# Patient Record
Sex: Female | Born: 1935
Health system: Southern US, Community
[De-identification: ages and names within clinical notes are randomized; demographics above are authoritative.]

## PROBLEM LIST (undated history)

## (undated) DIAGNOSIS — N189 Chronic kidney disease, unspecified: Secondary | ICD-10-CM

## (undated) DIAGNOSIS — N3281 Overactive bladder: Secondary | ICD-10-CM

## (undated) DIAGNOSIS — M545 Low back pain, unspecified: Secondary | ICD-10-CM

## (undated) DIAGNOSIS — E079 Disorder of thyroid, unspecified: Secondary | ICD-10-CM

## (undated) DIAGNOSIS — R188 Other ascites: Secondary | ICD-10-CM

## (undated) DIAGNOSIS — I10 Essential (primary) hypertension: Secondary | ICD-10-CM

## (undated) DIAGNOSIS — F0390 Unspecified dementia without behavioral disturbance: Secondary | ICD-10-CM

## (undated) DIAGNOSIS — E039 Hypothyroidism, unspecified: Secondary | ICD-10-CM

## (undated) DIAGNOSIS — M810 Age-related osteoporosis without current pathological fracture: Secondary | ICD-10-CM

## (undated) DIAGNOSIS — E119 Type 2 diabetes mellitus without complications: Secondary | ICD-10-CM

## (undated) DIAGNOSIS — E785 Hyperlipidemia, unspecified: Secondary | ICD-10-CM

## (undated) DIAGNOSIS — E7800 Pure hypercholesterolemia, unspecified: Secondary | ICD-10-CM

## (undated) DIAGNOSIS — M1990 Unspecified osteoarthritis, unspecified site: Secondary | ICD-10-CM

## (undated) DIAGNOSIS — K219 Gastro-esophageal reflux disease without esophagitis: Secondary | ICD-10-CM

## (undated) DIAGNOSIS — G8929 Other chronic pain: Secondary | ICD-10-CM

## (undated) DIAGNOSIS — L309 Dermatitis, unspecified: Secondary | ICD-10-CM

## (undated) DIAGNOSIS — L509 Urticaria, unspecified: Secondary | ICD-10-CM

## (undated) HISTORY — PX: OTHER SURGICAL HISTORY: SHX169

## (undated) HISTORY — PX: ABDOMINAL HYSTERECTOMY: SHX81

## (undated) HISTORY — DX: Overactive bladder: N32.81

## (undated) HISTORY — DX: Urticaria, unspecified: L50.9

## (undated) HISTORY — DX: Gastro-esophageal reflux disease without esophagitis: K21.9

## (undated) HISTORY — DX: Type 2 diabetes mellitus without complications: E11.9

## (undated) HISTORY — DX: Dermatitis, unspecified: L30.9

## (undated) HISTORY — DX: Age-related osteoporosis without current pathological fracture: M81.0

## (undated) HISTORY — PX: KNEE ARTHROSCOPY: SUR90

---

## 1998-04-08 ENCOUNTER — Encounter: Admission: RE | Admit: 1998-04-08 | Discharge: 1998-07-07 | Payer: Self-pay | Admitting: *Deleted

## 1998-06-18 ENCOUNTER — Ambulatory Visit (HOSPITAL_COMMUNITY): Admission: RE | Admit: 1998-06-18 | Discharge: 1998-06-18 | Payer: Self-pay | Admitting: *Deleted

## 1998-07-17 ENCOUNTER — Other Ambulatory Visit: Admission: RE | Admit: 1998-07-17 | Discharge: 1998-07-17 | Payer: Self-pay | Admitting: Internal Medicine

## 1998-07-29 ENCOUNTER — Ambulatory Visit (HOSPITAL_COMMUNITY): Admission: RE | Admit: 1998-07-29 | Discharge: 1998-07-29 | Payer: Self-pay | Admitting: *Deleted

## 1999-03-15 ENCOUNTER — Emergency Department (HOSPITAL_COMMUNITY): Admission: EM | Admit: 1999-03-15 | Discharge: 1999-03-15 | Payer: Self-pay | Admitting: Emergency Medicine

## 2000-02-27 ENCOUNTER — Encounter: Admission: RE | Admit: 2000-02-27 | Discharge: 2000-02-27 | Payer: Self-pay | Admitting: Internal Medicine

## 2000-02-27 ENCOUNTER — Encounter: Payer: Self-pay | Admitting: Internal Medicine

## 2000-04-27 ENCOUNTER — Emergency Department (HOSPITAL_COMMUNITY): Admission: EM | Admit: 2000-04-27 | Discharge: 2000-04-28 | Payer: Self-pay | Admitting: Emergency Medicine

## 2001-02-28 ENCOUNTER — Encounter: Admission: RE | Admit: 2001-02-28 | Discharge: 2001-02-28 | Payer: Self-pay | Admitting: Internal Medicine

## 2001-02-28 ENCOUNTER — Encounter: Payer: Self-pay | Admitting: Internal Medicine

## 2001-03-08 ENCOUNTER — Encounter: Payer: Self-pay | Admitting: Emergency Medicine

## 2001-03-08 ENCOUNTER — Emergency Department (HOSPITAL_COMMUNITY): Admission: EM | Admit: 2001-03-08 | Discharge: 2001-03-08 | Payer: Self-pay | Admitting: Emergency Medicine

## 2001-12-12 ENCOUNTER — Encounter: Payer: Self-pay | Admitting: Emergency Medicine

## 2001-12-12 ENCOUNTER — Emergency Department (HOSPITAL_COMMUNITY): Admission: EM | Admit: 2001-12-12 | Discharge: 2001-12-12 | Payer: Self-pay | Admitting: Emergency Medicine

## 2001-12-14 ENCOUNTER — Emergency Department (HOSPITAL_COMMUNITY): Admission: EM | Admit: 2001-12-14 | Discharge: 2001-12-14 | Payer: Self-pay | Admitting: Emergency Medicine

## 2002-02-16 ENCOUNTER — Emergency Department (HOSPITAL_COMMUNITY): Admission: EM | Admit: 2002-02-16 | Discharge: 2002-02-16 | Payer: Self-pay | Admitting: Emergency Medicine

## 2002-07-25 ENCOUNTER — Encounter: Payer: Self-pay | Admitting: *Deleted

## 2002-07-25 ENCOUNTER — Encounter: Admission: RE | Admit: 2002-07-25 | Discharge: 2002-07-25 | Payer: Self-pay | Admitting: *Deleted

## 2002-08-02 ENCOUNTER — Encounter: Admission: RE | Admit: 2002-08-02 | Discharge: 2002-08-02 | Payer: Self-pay | Admitting: *Deleted

## 2002-08-02 ENCOUNTER — Encounter: Payer: Self-pay | Admitting: *Deleted

## 2003-01-11 ENCOUNTER — Encounter: Admission: RE | Admit: 2003-01-11 | Discharge: 2003-01-11 | Payer: Self-pay | Admitting: Internal Medicine

## 2003-01-11 ENCOUNTER — Encounter: Payer: Self-pay | Admitting: Internal Medicine

## 2003-02-18 ENCOUNTER — Emergency Department (HOSPITAL_COMMUNITY): Admission: EM | Admit: 2003-02-18 | Discharge: 2003-02-18 | Payer: Self-pay | Admitting: Emergency Medicine

## 2003-04-13 ENCOUNTER — Ambulatory Visit (HOSPITAL_COMMUNITY): Admission: RE | Admit: 2003-04-13 | Discharge: 2003-04-13 | Payer: Self-pay | Admitting: *Deleted

## 2003-04-13 ENCOUNTER — Encounter (INDEPENDENT_AMBULATORY_CARE_PROVIDER_SITE_OTHER): Payer: Self-pay | Admitting: Specialist

## 2003-05-04 ENCOUNTER — Other Ambulatory Visit: Admission: RE | Admit: 2003-05-04 | Discharge: 2003-05-04 | Payer: Self-pay | Admitting: Internal Medicine

## 2003-05-08 ENCOUNTER — Encounter: Payer: Self-pay | Admitting: Internal Medicine

## 2003-05-08 ENCOUNTER — Encounter: Admission: RE | Admit: 2003-05-08 | Discharge: 2003-05-08 | Payer: Self-pay | Admitting: Internal Medicine

## 2003-11-23 ENCOUNTER — Encounter: Admission: RE | Admit: 2003-11-23 | Discharge: 2003-11-23 | Payer: Self-pay | Admitting: Internal Medicine

## 2004-02-13 ENCOUNTER — Emergency Department (HOSPITAL_COMMUNITY): Admission: EM | Admit: 2004-02-13 | Discharge: 2004-02-13 | Payer: Self-pay | Admitting: Emergency Medicine

## 2004-03-07 ENCOUNTER — Emergency Department (HOSPITAL_COMMUNITY): Admission: EM | Admit: 2004-03-07 | Discharge: 2004-03-07 | Payer: Self-pay

## 2004-06-16 ENCOUNTER — Encounter: Admission: RE | Admit: 2004-06-16 | Discharge: 2004-06-16 | Payer: Self-pay | Admitting: Internal Medicine

## 2004-09-12 ENCOUNTER — Emergency Department (HOSPITAL_COMMUNITY): Admission: EM | Admit: 2004-09-12 | Discharge: 2004-09-12 | Payer: Self-pay | Admitting: Emergency Medicine

## 2004-10-03 ENCOUNTER — Encounter: Admission: RE | Admit: 2004-10-03 | Discharge: 2004-10-03 | Payer: Self-pay | Admitting: Internal Medicine

## 2004-11-12 ENCOUNTER — Ambulatory Visit: Payer: Self-pay | Admitting: Family Medicine

## 2004-11-21 ENCOUNTER — Ambulatory Visit: Payer: Self-pay | Admitting: Family Medicine

## 2004-12-17 ENCOUNTER — Ambulatory Visit: Payer: Self-pay | Admitting: Family Medicine

## 2004-12-22 ENCOUNTER — Ambulatory Visit: Payer: Self-pay | Admitting: Family Medicine

## 2005-01-12 ENCOUNTER — Ambulatory Visit: Payer: Self-pay | Admitting: Endocrinology

## 2005-01-22 ENCOUNTER — Ambulatory Visit: Payer: Self-pay | Admitting: Endocrinology

## 2005-01-30 ENCOUNTER — Ambulatory Visit: Payer: Self-pay | Admitting: Endocrinology

## 2005-02-13 ENCOUNTER — Ambulatory Visit: Payer: Self-pay | Admitting: Endocrinology

## 2005-03-11 ENCOUNTER — Ambulatory Visit: Payer: Self-pay | Admitting: Endocrinology

## 2005-04-13 ENCOUNTER — Encounter: Admission: RE | Admit: 2005-04-13 | Discharge: 2005-04-13 | Payer: Self-pay | Admitting: Family Medicine

## 2005-05-07 ENCOUNTER — Ambulatory Visit: Payer: Self-pay | Admitting: Endocrinology

## 2005-06-05 ENCOUNTER — Ambulatory Visit: Payer: Self-pay | Admitting: Family Medicine

## 2005-06-29 ENCOUNTER — Ambulatory Visit: Payer: Self-pay | Admitting: Internal Medicine

## 2005-07-09 ENCOUNTER — Ambulatory Visit: Payer: Self-pay | Admitting: Family Medicine

## 2005-08-04 ENCOUNTER — Ambulatory Visit: Payer: Self-pay | Admitting: Endocrinology

## 2005-08-12 ENCOUNTER — Encounter (INDEPENDENT_AMBULATORY_CARE_PROVIDER_SITE_OTHER): Payer: Self-pay | Admitting: Specialist

## 2005-08-12 ENCOUNTER — Ambulatory Visit (HOSPITAL_COMMUNITY): Admission: RE | Admit: 2005-08-12 | Discharge: 2005-08-12 | Payer: Self-pay | Admitting: *Deleted

## 2005-09-04 ENCOUNTER — Ambulatory Visit: Payer: Self-pay | Admitting: Family Medicine

## 2005-09-16 ENCOUNTER — Ambulatory Visit: Payer: Self-pay | Admitting: Endocrinology

## 2005-10-09 ENCOUNTER — Ambulatory Visit: Payer: Self-pay | Admitting: Endocrinology

## 2005-10-27 ENCOUNTER — Ambulatory Visit: Payer: Self-pay | Admitting: Endocrinology

## 2005-11-04 ENCOUNTER — Ambulatory Visit: Payer: Self-pay | Admitting: Family Medicine

## 2005-12-04 ENCOUNTER — Encounter: Admission: RE | Admit: 2005-12-04 | Discharge: 2005-12-04 | Payer: Self-pay | Admitting: *Deleted

## 2005-12-07 ENCOUNTER — Encounter (INDEPENDENT_AMBULATORY_CARE_PROVIDER_SITE_OTHER): Payer: Self-pay | Admitting: Specialist

## 2005-12-07 ENCOUNTER — Ambulatory Visit (HOSPITAL_COMMUNITY): Admission: RE | Admit: 2005-12-07 | Discharge: 2005-12-07 | Payer: Self-pay | Admitting: *Deleted

## 2005-12-30 ENCOUNTER — Ambulatory Visit: Payer: Self-pay | Admitting: Family Medicine

## 2006-01-08 ENCOUNTER — Ambulatory Visit: Payer: Self-pay | Admitting: Endocrinology

## 2006-02-04 ENCOUNTER — Ambulatory Visit: Payer: Self-pay | Admitting: Endocrinology

## 2006-02-18 ENCOUNTER — Ambulatory Visit: Payer: Self-pay | Admitting: Endocrinology

## 2006-03-05 ENCOUNTER — Emergency Department (HOSPITAL_COMMUNITY): Admission: EM | Admit: 2006-03-05 | Discharge: 2006-03-05 | Payer: Self-pay | Admitting: Emergency Medicine

## 2006-03-11 ENCOUNTER — Ambulatory Visit: Payer: Self-pay | Admitting: Endocrinology

## 2006-03-31 ENCOUNTER — Ambulatory Visit: Payer: Self-pay | Admitting: Family Medicine

## 2006-04-02 ENCOUNTER — Ambulatory Visit: Payer: Self-pay | Admitting: Family Medicine

## 2006-04-20 ENCOUNTER — Ambulatory Visit: Payer: Self-pay | Admitting: Endocrinology

## 2006-05-19 ENCOUNTER — Ambulatory Visit: Payer: Self-pay | Admitting: Family Medicine

## 2006-06-09 ENCOUNTER — Ambulatory Visit: Payer: Self-pay | Admitting: Endocrinology

## 2006-06-28 ENCOUNTER — Ambulatory Visit: Payer: Self-pay | Admitting: Family Medicine

## 2006-07-14 ENCOUNTER — Encounter: Payer: Self-pay | Admitting: Family Medicine

## 2006-07-14 ENCOUNTER — Ambulatory Visit: Admission: RE | Admit: 2006-07-14 | Discharge: 2006-07-14 | Payer: Self-pay | Admitting: Family Medicine

## 2006-07-21 ENCOUNTER — Ambulatory Visit: Payer: Self-pay | Admitting: Endocrinology

## 2006-09-01 ENCOUNTER — Ambulatory Visit: Payer: Self-pay | Admitting: Endocrinology

## 2006-09-15 ENCOUNTER — Ambulatory Visit: Payer: Self-pay | Admitting: Family Medicine

## 2006-10-06 ENCOUNTER — Ambulatory Visit: Payer: Self-pay | Admitting: Endocrinology

## 2006-10-26 ENCOUNTER — Emergency Department (HOSPITAL_COMMUNITY): Admission: EM | Admit: 2006-10-26 | Discharge: 2006-10-26 | Payer: Self-pay | Admitting: Family Medicine

## 2006-11-12 ENCOUNTER — Encounter: Admission: RE | Admit: 2006-11-12 | Discharge: 2006-11-12 | Payer: Self-pay | Admitting: Family Medicine

## 2006-12-01 ENCOUNTER — Ambulatory Visit: Payer: Self-pay | Admitting: Endocrinology

## 2006-12-01 LAB — CONVERTED CEMR LAB
Creatinine,U: 141.6 mg/dL
Hgb A1c MFr Bld: 11.6 % — ABNORMAL HIGH (ref 4.6–6.0)
Microalb Creat Ratio: 15.5 mg/g (ref 0.0–30.0)
Microalb, Ur: 2.2 mg/dL — ABNORMAL HIGH (ref 0.0–1.9)

## 2006-12-07 ENCOUNTER — Ambulatory Visit: Payer: Self-pay | Admitting: Family Medicine

## 2006-12-15 ENCOUNTER — Emergency Department (HOSPITAL_COMMUNITY): Admission: EM | Admit: 2006-12-15 | Discharge: 2006-12-15 | Payer: Self-pay | Admitting: Family Medicine

## 2007-01-19 ENCOUNTER — Ambulatory Visit: Payer: Self-pay | Admitting: Family Medicine

## 2007-01-19 LAB — CONVERTED CEMR LAB
ALT: 28 units/L (ref 0–35)
AST: 27 units/L (ref 0–37)
Albumin: 3.8 g/dL (ref 3.5–5.2)
Alkaline Phosphatase: 74 units/L (ref 39–117)
BUN: 17 mg/dL (ref 6–23)
Basophils Absolute: 0 10*3/uL (ref 0.0–0.1)
Basophils Relative: 0.9 % (ref 0.0–1.0)
Bilirubin, Direct: 0.1 mg/dL (ref 0.0–0.3)
CO2: 31 meq/L (ref 19–32)
Calcium: 9.8 mg/dL (ref 8.4–10.5)
Chloride: 98 meq/L (ref 96–112)
Creatinine, Ser: 0.7 mg/dL (ref 0.4–1.2)
Eosinophils Absolute: 0 10*3/uL (ref 0.0–0.6)
Eosinophils Relative: 0.7 % (ref 0.0–5.0)
GFR calc Af Amer: 106 mL/min
GFR calc non Af Amer: 88 mL/min
Glucose, Bld: 318 mg/dL — ABNORMAL HIGH (ref 70–99)
HCT: 36 % (ref 36.0–46.0)
Hemoglobin: 12.1 g/dL (ref 12.0–15.0)
Hgb A1c MFr Bld: 12.2 % — ABNORMAL HIGH (ref 4.6–6.0)
Iron: 62 ug/dL (ref 42–145)
Lymphocytes Relative: 28.7 % (ref 12.0–46.0)
MCHC: 33.6 g/dL (ref 30.0–36.0)
MCV: 91.3 fL (ref 78.0–100.0)
Monocytes Absolute: 0.4 10*3/uL (ref 0.2–0.7)
Monocytes Relative: 6.8 % (ref 3.0–11.0)
Neutro Abs: 3.5 10*3/uL (ref 1.4–7.7)
Neutrophils Relative %: 62.9 % (ref 43.0–77.0)
Platelets: 265 10*3/uL (ref 150–400)
Potassium: 4 meq/L (ref 3.5–5.1)
RBC: 3.94 M/uL (ref 3.87–5.11)
RDW: 13.6 % (ref 11.5–14.6)
Saturation Ratios: 18.1 % — ABNORMAL LOW (ref 20.0–50.0)
Sodium: 135 meq/L (ref 135–145)
TSH: 0.17 microintl units/mL — ABNORMAL LOW (ref 0.35–5.50)
Total Bilirubin: 0.8 mg/dL (ref 0.3–1.2)
Total Protein: 7.2 g/dL (ref 6.0–8.3)
Transferrin: 244.3 mg/dL (ref >=212.0)
WBC: 5.4 10*3/uL (ref 4.5–10.5)

## 2007-01-26 DIAGNOSIS — E039 Hypothyroidism, unspecified: Secondary | ICD-10-CM | POA: Insufficient documentation

## 2007-01-26 DIAGNOSIS — Z794 Long term (current) use of insulin: Secondary | ICD-10-CM | POA: Insufficient documentation

## 2007-01-26 DIAGNOSIS — M899 Disorder of bone, unspecified: Secondary | ICD-10-CM | POA: Insufficient documentation

## 2007-01-26 DIAGNOSIS — K219 Gastro-esophageal reflux disease without esophagitis: Secondary | ICD-10-CM | POA: Insufficient documentation

## 2007-01-26 DIAGNOSIS — M1990 Unspecified osteoarthritis, unspecified site: Secondary | ICD-10-CM | POA: Insufficient documentation

## 2007-01-26 DIAGNOSIS — E119 Type 2 diabetes mellitus without complications: Secondary | ICD-10-CM | POA: Insufficient documentation

## 2007-01-26 DIAGNOSIS — M949 Disorder of cartilage, unspecified: Secondary | ICD-10-CM

## 2007-01-26 DIAGNOSIS — E0590 Thyrotoxicosis, unspecified without thyrotoxic crisis or storm: Secondary | ICD-10-CM | POA: Insufficient documentation

## 2007-01-26 DIAGNOSIS — E785 Hyperlipidemia, unspecified: Secondary | ICD-10-CM | POA: Insufficient documentation

## 2007-01-28 ENCOUNTER — Ambulatory Visit: Payer: Self-pay | Admitting: Family Medicine

## 2007-01-28 LAB — CONVERTED CEMR LAB
Cholesterol: 210 mg/dL (ref 0–200)
Direct LDL: 101.2 mg/dL
HDL: 91.4 mg/dL (ref >39.0)
Total CHOL/HDL Ratio: 2.3
Triglycerides: 76 mg/dL (ref 0–149)
VLDL: 15 mg/dL (ref 0–40)

## 2007-02-09 ENCOUNTER — Ambulatory Visit: Payer: Self-pay | Admitting: Endocrinology

## 2007-02-14 DIAGNOSIS — I10 Essential (primary) hypertension: Secondary | ICD-10-CM | POA: Insufficient documentation

## 2007-02-14 DIAGNOSIS — J309 Allergic rhinitis, unspecified: Secondary | ICD-10-CM | POA: Insufficient documentation

## 2007-02-14 DIAGNOSIS — E1159 Type 2 diabetes mellitus with other circulatory complications: Secondary | ICD-10-CM | POA: Insufficient documentation

## 2007-03-22 ENCOUNTER — Ambulatory Visit: Payer: Self-pay | Admitting: Endocrinology

## 2007-04-12 ENCOUNTER — Telehealth: Payer: Self-pay | Admitting: Family Medicine

## 2007-04-23 ENCOUNTER — Ambulatory Visit: Payer: Self-pay | Admitting: Endocrinology

## 2007-05-04 ENCOUNTER — Ambulatory Visit: Payer: Self-pay | Admitting: Family Medicine

## 2007-05-04 DIAGNOSIS — J019 Acute sinusitis, unspecified: Secondary | ICD-10-CM | POA: Insufficient documentation

## 2007-05-13 ENCOUNTER — Telehealth (INDEPENDENT_AMBULATORY_CARE_PROVIDER_SITE_OTHER): Payer: Self-pay | Admitting: *Deleted

## 2007-06-20 ENCOUNTER — Ambulatory Visit: Payer: Self-pay | Admitting: Endocrinology

## 2007-06-20 LAB — CONVERTED CEMR LAB: Hgb A1c MFr Bld: 10.4 % — ABNORMAL HIGH (ref 4.6–6.0)

## 2007-07-01 ENCOUNTER — Ambulatory Visit: Payer: Self-pay | Admitting: Family Medicine

## 2007-07-02 ENCOUNTER — Emergency Department (HOSPITAL_COMMUNITY): Admission: EM | Admit: 2007-07-02 | Discharge: 2007-07-02 | Payer: Self-pay | Admitting: Family Medicine

## 2007-08-03 ENCOUNTER — Ambulatory Visit: Payer: Self-pay | Admitting: Family Medicine

## 2007-08-03 DIAGNOSIS — B370 Candidal stomatitis: Secondary | ICD-10-CM | POA: Insufficient documentation

## 2007-08-10 ENCOUNTER — Telehealth: Payer: Self-pay | Admitting: Family Medicine

## 2007-08-12 ENCOUNTER — Ambulatory Visit: Payer: Self-pay | Admitting: Family Medicine

## 2007-08-12 DIAGNOSIS — G4700 Insomnia, unspecified: Secondary | ICD-10-CM | POA: Insufficient documentation

## 2007-08-22 ENCOUNTER — Encounter: Payer: Self-pay | Admitting: Family Medicine

## 2007-08-25 ENCOUNTER — Telehealth: Payer: Self-pay | Admitting: Family Medicine

## 2007-10-26 ENCOUNTER — Encounter (INDEPENDENT_AMBULATORY_CARE_PROVIDER_SITE_OTHER): Payer: Self-pay | Admitting: *Deleted

## 2007-10-31 ENCOUNTER — Encounter: Payer: Self-pay | Admitting: Family Medicine

## 2007-11-16 ENCOUNTER — Ambulatory Visit: Payer: Self-pay | Admitting: Family Medicine

## 2007-11-16 DIAGNOSIS — K5900 Constipation, unspecified: Secondary | ICD-10-CM | POA: Insufficient documentation

## 2007-12-14 ENCOUNTER — Encounter: Payer: Self-pay | Admitting: Family Medicine

## 2008-01-03 ENCOUNTER — Ambulatory Visit: Payer: Self-pay | Admitting: Family Medicine

## 2008-01-23 ENCOUNTER — Encounter: Admission: RE | Admit: 2008-01-23 | Discharge: 2008-01-23 | Payer: Self-pay | Admitting: Family Medicine

## 2008-02-02 ENCOUNTER — Ambulatory Visit: Payer: Self-pay | Admitting: Family Medicine

## 2008-02-03 LAB — CONVERTED CEMR LAB
ALT: 49 units/L — ABNORMAL HIGH (ref 0–35)
AST: 50 units/L — ABNORMAL HIGH (ref 0–37)
Albumin: 3.9 g/dL (ref 3.5–5.2)
Alkaline Phosphatase: 67 units/L (ref 39–117)
BUN: 5 mg/dL — ABNORMAL LOW (ref 6–23)
Basophils Absolute: 0 10*3/uL (ref 0.0–0.1)
Basophils Relative: 0.2 % (ref 0.0–3.0)
Bilirubin, Direct: 0.1 mg/dL (ref 0.0–0.3)
CO2: 30 meq/L (ref 19–32)
Calcium: 9.9 mg/dL (ref 8.4–10.5)
Chloride: 95 meq/L — ABNORMAL LOW (ref 96–112)
Cholesterol: 187 mg/dL (ref 0–200)
Creatinine, Ser: 0.9 mg/dL (ref 0.4–1.2)
Eosinophils Absolute: 0 10*3/uL (ref 0.0–0.7)
Eosinophils Relative: 1 % (ref 0.0–5.0)
GFR calc Af Amer: 79 mL/min
GFR calc non Af Amer: 66 mL/min
Glucose, Bld: 357 mg/dL — ABNORMAL HIGH (ref 70–99)
HCT: 33.7 % — ABNORMAL LOW (ref 36.0–46.0)
HDL: 90.6 mg/dL (ref >39.0)
Hemoglobin: 11.4 g/dL — ABNORMAL LOW (ref 12.0–15.0)
Hgb A1c MFr Bld: 10.9 % — ABNORMAL HIGH (ref 4.6–6.0)
LDL Cholesterol: 82 mg/dL (ref 0–99)
Lymphocytes Relative: 24.4 % (ref 12.0–46.0)
MCHC: 33.8 g/dL (ref 30.0–36.0)
MCV: 93.7 fL (ref 78.0–100.0)
Monocytes Absolute: 0.4 10*3/uL (ref 0.1–1.0)
Monocytes Relative: 9.3 % (ref 3.0–12.0)
Neutro Abs: 2.9 10*3/uL (ref 1.4–7.7)
Neutrophils Relative %: 65.1 % (ref 43.0–77.0)
Platelets: 253 10*3/uL (ref 150–400)
Potassium: 4.1 meq/L (ref 3.5–5.1)
RBC: 3.59 M/uL — ABNORMAL LOW (ref 3.87–5.11)
RDW: 12.2 % (ref 11.5–14.6)
Sodium: 133 meq/L — ABNORMAL LOW (ref 135–145)
TSH: 2.53 microintl units/mL (ref 0.35–5.50)
Total Bilirubin: 0.6 mg/dL (ref 0.3–1.2)
Total CHOL/HDL Ratio: 2.1
Total Protein: 7 g/dL (ref 6.0–8.3)
Triglycerides: 74 mg/dL (ref 0–149)
VLDL: 15 mg/dL (ref 0–40)
WBC: 4.4 10*3/uL — ABNORMAL LOW (ref 4.5–10.5)

## 2008-03-12 ENCOUNTER — Ambulatory Visit (HOSPITAL_COMMUNITY): Admission: RE | Admit: 2008-03-12 | Discharge: 2008-03-12 | Payer: Self-pay | Admitting: *Deleted

## 2008-03-12 ENCOUNTER — Encounter (INDEPENDENT_AMBULATORY_CARE_PROVIDER_SITE_OTHER): Payer: Self-pay | Admitting: *Deleted

## 2008-03-30 ENCOUNTER — Ambulatory Visit: Payer: Self-pay | Admitting: Family Medicine

## 2008-06-28 ENCOUNTER — Encounter: Payer: Self-pay | Admitting: Family Medicine

## 2008-07-24 ENCOUNTER — Ambulatory Visit: Payer: Self-pay | Admitting: Family Medicine

## 2008-07-24 DIAGNOSIS — R42 Dizziness and giddiness: Secondary | ICD-10-CM | POA: Insufficient documentation

## 2008-07-24 DIAGNOSIS — L259 Unspecified contact dermatitis, unspecified cause: Secondary | ICD-10-CM | POA: Insufficient documentation

## 2008-07-27 ENCOUNTER — Telehealth: Payer: Self-pay | Admitting: Family Medicine

## 2008-08-14 ENCOUNTER — Ambulatory Visit: Payer: Self-pay | Admitting: Family Medicine

## 2008-08-14 DIAGNOSIS — L02419 Cutaneous abscess of limb, unspecified: Secondary | ICD-10-CM | POA: Insufficient documentation

## 2008-08-14 DIAGNOSIS — L03119 Cellulitis of unspecified part of limb: Secondary | ICD-10-CM

## 2008-09-21 ENCOUNTER — Encounter: Payer: Self-pay | Admitting: Family Medicine

## 2008-09-25 ENCOUNTER — Ambulatory Visit: Payer: Self-pay | Admitting: Oncology

## 2008-09-28 ENCOUNTER — Ambulatory Visit: Payer: Self-pay | Admitting: Family Medicine

## 2008-10-01 ENCOUNTER — Emergency Department (HOSPITAL_COMMUNITY): Admission: EM | Admit: 2008-10-01 | Discharge: 2008-10-01 | Payer: Self-pay | Admitting: Emergency Medicine

## 2008-10-10 ENCOUNTER — Encounter: Payer: Self-pay | Admitting: Family Medicine

## 2008-10-10 ENCOUNTER — Encounter (HOSPITAL_BASED_OUTPATIENT_CLINIC_OR_DEPARTMENT_OTHER): Admission: RE | Admit: 2008-10-10 | Discharge: 2008-10-22 | Payer: Self-pay | Admitting: General Surgery

## 2008-10-10 LAB — CBC & DIFF AND RETIC
BASO%: 0.2 % (ref 0.0–2.0)
EOS%: 0.9 % (ref 0.0–7.0)
IRF: 0.43 — ABNORMAL HIGH (ref 0.130–0.330)
MCH: 30 pg (ref 25.1–34.0)
MCHC: 33.5 g/dL (ref 31.5–36.0)
RBC: 3.71 10*6/uL (ref 3.70–5.45)
RDW: 13.3 % (ref 11.2–14.5)
RETIC #: 86.8 10*3/uL (ref 19.7–115.1)
Retic %: 2.3 % (ref 0.4–2.3)
lymph#: 0.9 10*3/uL (ref 0.9–3.3)

## 2008-10-10 LAB — CHCC SMEAR

## 2008-10-10 LAB — MORPHOLOGY

## 2008-10-10 LAB — ERYTHROCYTE SEDIMENTATION RATE: Sed Rate: 71 mm/hr — ABNORMAL HIGH (ref 0–30)

## 2008-10-12 ENCOUNTER — Ambulatory Visit: Payer: Self-pay | Admitting: Family Medicine

## 2008-10-12 LAB — IMMUNOFIXATION ELECTROPHORESIS
IgA: 217 mg/dL (ref 68–378)
IgG (Immunoglobin G), Serum: 1540 mg/dL (ref 694–1618)
IgM, Serum: 112 mg/dL (ref 60–263)
Total Protein, Serum Electrophoresis: 7.8 g/dL (ref 6.0–8.3)

## 2008-10-13 LAB — COMPREHENSIVE METABOLIC PANEL
ALT: 14 U/L (ref 0–35)
AST: 18 U/L (ref 0–37)
Albumin: 4.1 g/dL (ref 3.5–5.2)
Alkaline Phosphatase: 90 U/L (ref 39–117)
Glucose, Bld: 93 mg/dL (ref 70–99)
Potassium: 4.8 mEq/L (ref 3.5–5.3)
Sodium: 136 mEq/L (ref 135–145)
Total Bilirubin: 0.2 mg/dL — ABNORMAL LOW (ref 0.3–1.2)
Total Protein: 7.3 g/dL (ref 6.0–8.3)

## 2008-10-13 LAB — HEPATITIS C ANTIBODY: HCV Ab: NEGATIVE

## 2008-10-15 ENCOUNTER — Encounter: Payer: Self-pay | Admitting: Family Medicine

## 2008-10-18 ENCOUNTER — Encounter: Payer: Self-pay | Admitting: Family Medicine

## 2008-10-31 ENCOUNTER — Encounter: Payer: Self-pay | Admitting: Family Medicine

## 2008-11-01 ENCOUNTER — Encounter: Admission: RE | Admit: 2008-11-01 | Discharge: 2008-11-01 | Payer: Self-pay | Admitting: Endocrinology

## 2008-11-02 ENCOUNTER — Encounter: Payer: Self-pay | Admitting: Family Medicine

## 2009-03-01 ENCOUNTER — Encounter: Admission: RE | Admit: 2009-03-01 | Discharge: 2009-03-01 | Payer: Self-pay | Admitting: Internal Medicine

## 2009-06-02 ENCOUNTER — Emergency Department (HOSPITAL_COMMUNITY): Admission: EM | Admit: 2009-06-02 | Discharge: 2009-06-02 | Payer: Self-pay | Admitting: Emergency Medicine

## 2009-08-07 ENCOUNTER — Ambulatory Visit (HOSPITAL_COMMUNITY): Admission: RE | Admit: 2009-08-07 | Discharge: 2009-08-07 | Payer: Self-pay | Admitting: Dermatology

## 2010-03-05 ENCOUNTER — Encounter: Admission: RE | Admit: 2010-03-05 | Discharge: 2010-03-05 | Payer: Self-pay | Admitting: Internal Medicine

## 2010-05-22 ENCOUNTER — Encounter: Payer: Self-pay | Admitting: Family Medicine

## 2010-06-23 ENCOUNTER — Encounter: Payer: Self-pay | Admitting: Family Medicine

## 2010-08-19 NOTE — Consult Note (Signed)
Summary: [facility]  [facility]   Imported By: Maryln Gottron 05/30/2010 10:35:59  _____________________________________________________________________  External Attachment:    Type:   Image     Comment:   External Document

## 2010-08-21 NOTE — Letter (Signed)
Summary: [facility]  [facility]   Imported By: Maryln Gottron 07/02/2010 15:44:04  _____________________________________________________________________  External Attachment:    Type:   Image     Comment:   External Document

## 2010-08-31 ENCOUNTER — Other Ambulatory Visit: Payer: Self-pay | Admitting: Family Medicine

## 2010-10-22 LAB — URINALYSIS, ROUTINE W REFLEX MICROSCOPIC
Bilirubin Urine: NEGATIVE
Glucose, UA: >1000 mg/dL — AB
Hgb urine dipstick: NEGATIVE
Leukocytes, UA: NEGATIVE
Nitrite: NEGATIVE
Protein, ur: NEGATIVE mg/dL
Specific Gravity, Urine: 1.014 (ref 1.005–1.030)
Urobilinogen, UA: 0.2 mg/dL (ref 0.0–1.0)
pH: 5.5 (ref 5.0–8.0)

## 2010-10-22 LAB — URINE CULTURE: Colony Count: 45000

## 2010-10-22 LAB — URINE MICROSCOPIC-ADD ON

## 2010-10-22 LAB — POCT I-STAT, CHEM 8
BUN: 19 mg/dL (ref 6–23)
Calcium, Ion: 1.02 mmol/L — ABNORMAL LOW (ref 1.12–1.32)
Chloride: 101 mEq/L (ref 96–112)
Creatinine, Ser: 0.9 mg/dL (ref 0.4–1.2)
Glucose, Bld: 116 mg/dL — ABNORMAL HIGH (ref 70–99)
HCT: 37 % (ref 36.0–46.0)
Hemoglobin: 12.6 g/dL (ref 12.0–15.0)
Potassium: 3.8 mEq/L (ref 3.5–5.1)
Sodium: 129 mEq/L — ABNORMAL LOW (ref 135–145)
TCO2: 22 mmol/L (ref 0–100)

## 2010-10-22 LAB — GLUCOSE, CAPILLARY
Glucose-Capillary: 208 mg/dL — ABNORMAL HIGH (ref 70–99)
Glucose-Capillary: 54 mg/dL — ABNORMAL LOW (ref 70–99)

## 2010-12-02 NOTE — Op Note (Signed)
NAMEPHILENA, [name] [name]               ACCOUNT NO.:  [id number]   MEDICAL RECORD NO.:  [id number]          PATIENT TYPE:  AMB   LOCATION:  ENDO                         FACILITY:  [facility]   PHYSICIAN:  Georgiana Spinner, M.D.    DATE OF BIRTH:  1934-03-06   DATE OF PROCEDURE:  DATE OF DISCHARGE:                               OPERATIVE REPORT   PROCEDURE:  Colonoscopy.   INDICATIONS:  Colon polyps.   ANESTHESIA:  Fentanyl 70 mcg, Versed 7 mg.   PROCEDURE:  With the patient mildly sedated in the left lateral  decubitus position, the Pentax videoscopic pediatric colonoscope was  inserted in the rectum and passed under direct vision to the cecum,  identified by ileocecal valve and appendiceal orifice, both of which  were photographed.  From this point the colonoscope was slowly withdrawn  taking circumferential views of colonic mucosa, stopping first at the  hepatic flexure where a small polyp was seen, photographed and removed  using hot biopsy forceps technique setting of 20/150 blended current.  We next stopped at the descending colon where two more polyps were seen,  the first was photographed, the second was much smaller and both were  removed using hot biopsy forceps technique,  again a setting of 20/150  blended current.  We then withdrew the colonoscope all the way to the  rectum, which appeared normal on direct and showed hemorrhoids on  retroflexed view.  The endoscope was straightened and withdrawn.  The  patient's vital signs and pulse oximeter remained stable.  The patient  tolerated procedure well without apparent complications.   FINDINGS:  Internal hemorrhoids.  Polyps of descending colon, hepatic  flexure area.  Await biopsy report.  The patient will call me for  results and follow up with me as needed as an outpatient.           ______________________________  Georgiana Spinner, M.D.     GMO/MEDQ  D:  03/12/2008  T:  03/12/2008  Job:  [id number]

## 2010-12-02 NOTE — Assessment & Plan Note (Signed)
[facility] OFFICE NOTE   NAME:[name] [name], [name] [name]                      MRN:          [id number]  DATE:01/19/2007                            DOB:          Aug 15, 1933    This is a 77 year old woman here for a non gynecological physical  examination. There is one issue she would primarily like to discuss and  is of joint pains.  She particularly has problems with stiffness and  pain in the lower back, over the last 2 weeks it has been more of a  problem than usual. She primarily takes Tylenol for this which helps  only minimally.  She is able to get around and do most of the activities  that she would like.  She continues to see Dr. Pennie Rushing for gynecological  examinations.  However, it has been 2 years since she has seen Dr.  Pennie Rushing.  She saw Dr. Virginia Rochester in January of 2007 for a colonoscopy, a single  benign polyp was removed and a 5-year followup was recommended at that  time.  She also continues to see Dr. Everardo All for care of her type 2  diabetes mellitus.  Otherwise she is doing well. For details of her past  medical history, family history, social history, habits, etc., refer to  her last physical note dated June 06, 2007.   ALLERGIES:  SULFA.   CURRENT MEDICATIONS:  Iron tablet daily, Glucophage 1000 mg b.i.d.,  Unithroid 100 mcg per day, aspirin 81 mg per day, Lisinopril 2.5 mg  daily, Vytorin 10/40 one daily, Os-Cal b.i.d., Humalog 70/25 Insulin 30  units q.a.m. and 15 units in the evening, Aciphex 20 mg daily.   OBJECTIVE:  VITAL SIGNS:  Height 5 feet, 6 inches, weight 133.  Blood  pressure 132/66, pulse 60 and regular.  GENERAL:  She appears to be at her baseline which is fairly healthy for  her age.  SKIN:  Clear.  HEENT:  Eyes are clear, she wears glasses. Oropharynx clear.  NECK:  Supple without lymphadenopathy or masses.  LUNGS:  Clear.  CARDIAC:  Regular rate and rhythm without murmurs, rubs, or  gallops,  distal pulses are full.  EKG is within normal limits.  ABDOMEN:  Soft, normal bowel sounds, nontender, no masses.  EXTREMITIES:  No clubbing, cyanosis or edema.  NEUROLOGIC EXAM:  Grossly intact.  LUMBAR SPINE:  Shows some very mild tenderness but no spasm and fairly  good range of motion.   ASSESSMENT AND PLAN:  Problem #1:  Complete physical.  She is fasting so  will check the usual laboratories.  I also advised her to contact Dr.  Lilian Coma office to set up a GYN exam as soon as possible.  PROBLEM #2:  Diabetes mellitus per Dr. Everardo All.  PROBLEM  #3:  Degenerative joint disease. Darvocet N 100 to use as  needed for pain #60 with 5 refills.  PROBLEM #4:  Hypertension, stable.  PROBLEM #5:  Hyperlipidemia. Will check a lipid panel today.  PROBLEM #6:  Hypothyroidism, will check a TSH level today.     Tera Mater.  Clent Ridges, MD  Electronically Signed    SAF/MedQ  DD: 01/19/2007  DT: 01/19/2007  Job #: [id number]

## 2010-12-02 NOTE — Assessment & Plan Note (Signed)
[facility]   NAME:  [name] [name], [name] [name]               ACCOUNT NO.:  [id number]   MEDICAL RECORD NO.:  [id number]      DATE OF BIRTH:  02-03-1934   PHYSICIAN:  Leonie Man, M.D.    VISIT DATE:  10/15/2008                                   OFFICE VISIT   This an initial visit for [name] [name] who is a 77 year old black  female with a history of diabetes mellitus and hypertension.  She is  referred to the [facility] courtesy of Dr. Gershon Crane for  evaluation of bilateral leg erythema and on a working diagnosis of a  cellulitis.   The patient was previously referred to Dr. 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  Further evaluation shows elevations in  her Antithrombin III and protein C, protein S, as well as her lupus  anticoagulant.   The patient notes that her leg lesions have been a chronic recurrent  problem occurring in various places along the anterior aspect of her  legs.  She notes that after starting on fluticasone, the erythema has  been somewhat improved.   PAST MEDICAL HISTORY:  1. Diabetes type 2.  2. Hyperlipidemia.  3. Gastroesophageal reflux disease.  4. Osteopenia.  5. Hypertension.  6. Allergic rhinitis.   ALLERGIES:  SULFA, which causes a rash.   MEDICATIONS:  1. Glucophage 100 mg twice daily.  2. Vytorin 10/40 daily.  3. Premarin 0.625 mg.  4. Unithroid 100 mcg daily.  5. Lisinopril 2.5 mg daily.  6. Humalog insulin.  7. Nexium 40 mg daily.  8. Aspirin 81 mg daily.  9. Ferrex 150 mg daily.  10.Nasacort Aqueous 55 mcg spray to each nostril once daily.  11.Nystatin for swish and swallow.  12.Temazepam 15 mg at bedtime.  13.Etodolac 500 mg twice daily.  14.Meclizine 25 mg daily.  15.Fluticasone 0.05% to apply 3 times daily as needed.   REVIEW OF SYSTEMS:  GENERAL:  The patient denies chills, fever, weight  gain, or weight loss.   NEUROLOGICAL:  Negative.  GI:  Negative.  GU:  Negative.  MUSCULOSKELETAL:  Bilateral leg edema.   PHYSICAL EXAMINATION:  GENERAL:  Well-developed, well-nourished African  American female in no acute distress.  VITAL SIGNS:  Temperature 98.4, pulse 75, respirations 18, blood  pressure 145/76.  HEENT:  Pupils are equal.  No scleral icterus.  Nasopharynx, no  obstruction, well hydrated.  NECK:  No bruits.  No thyromegaly.  No cervical adenopathy.  LUNGS:  Clear to auscultation bilaterally.  HEART:  Regular rate and rhythm without murmurs.  ABDOMEN:  Normoactive bowel sounds.  No visceromegaly.  No palpable  masses.  EXTREMITIES:  Erythematous patches interspersed with depigmented  scarring over the anterior leg.  There is a small punch biopsy wound of  the right anterior leg, which is healing satisfactorily.  There is no  calf or leg tenderness.  There is no lower extremity edema.   ASSESSMENT:  1. There is a probable vasculopathy.  2. Diabetes type 2 with hemoglobin A1c greater than 10.  3. Hypertension, well controlled.   RECOMMENDATIONS:  1. Follow up with Hematology for evaluation of possible coagulopathic  lesion.  2. Follow up with Dr. Gershon Crane, and the patient will need to be      evaluated to rule out an occult malignancy.  There is currently no      evidence to support any efficacy for hyperbaric oxygen therapy in      this patient.  I would recommend continuing the fluticasone in Ms.      [name].      Leonie Man, M.D.  Electronically Signed     PB/MEDQ  D:  10/15/2008  T:  10/16/2008  Job:  [id number]   cc:   Jeannett Senior A. Clent Ridges, MD

## 2010-12-02 NOTE — Consult Note (Signed)
[name]                          ENDOCRINOLOGY CONSULTATION   NAME:[name], [name]                      MRN:          [id number]  DATE:02/09/2007                            DOB:          06/23/1934    REASON FOR VISIT:  Follow up diabetes.   HISTORY OF THE PRESENT ILLNESS:  A 77 year old woman who recently saw  Dr. Pennie Rushing, and was found to have a glucose of about 500.  She had  previously been on 70/30 insulin to give her more insulin in response to  her meals, but she stated she wanted to go to a simpler regimen.  Therefore, she takes Lantus 40 units a day.   PAST MEDICAL HISTORY:  1. Allergic rhinitis.  2. Menopause.  3. Hypothyroidism.  4. Hypertension.  5. Dyslipidemia.   REVIEW OF SYSTEMS:  Frequent hypoglycemia before breakfast.   PHYSICAL EXAMINATION:  Blood pressure is 110/58, heart rate 63,  temperature is 98.4, the weight is 132.  GENERAL:  No distress.  She is alert and well oriented, she does not  appear anxious nor depressed.\   IMPRESSION:  Although she wanted once a day insulin, I feel it is much  better for her health to take it at least twice a day.  She agrees.   PLAN:  1. Change back to 70/30 insulin, 30 units with breakfast and 15 units      with supper.  2. Return in 30 days.     Sean A. Everardo All, MD  Electronically Signed    SAE/MedQ  DD: 02/11/2007  DT: 02/13/2007  Job #: [id number]   cc:   Jeannett Senior A. Clent Ridges, MD  Hal Morales, M.D.

## 2010-12-05 NOTE — Op Note (Signed)
[name], [name] [name]               ACCOUNT NO.:  [id number]   MEDICAL RECORD NO.:  [id number]          PATIENT TYPE:  AMB   LOCATION:  ENDO                         FACILITY:  [facility]   PHYSICIAN:  Georgiana Spinner, M.D.    DATE OF BIRTH:  03/09/1934   DATE OF PROCEDURE:  08/12/2005  DATE OF DISCHARGE:                                 OPERATIVE REPORT   PROCEDURE:  Colonoscopy with biopsy   INDICATIONS:  Colon polyps.   ANESTHESIA:  Demerol 60, Versed 7 mg.   DESCRIPTION OF PROCEDURE:  With the patient mildly sedated in the left  lateral decubitus position, the Olympus videoscopic colonoscope was inserted  in the rectum, passed under direct vision to the cecum identified by the  ileocecal valve and appendiceal orifice both of which were photographed. Of  note, the prep was slightly suboptimal in that there were areas of semisolid  stool that had to be washed and dried suctioned and there was some vegetable  material that precluded thorough drainage of this material. But from this  point, the colonoscope was slowly withdrawn taking circumferential views of  the colonic mucosa stopping in the splenic flexure area where there was a  small polyp seen, photographed and removed using hot biopsy forceps  technique setting  of 20/200 blended current. We then withdrew all the way  to the rectum which appeared normal on direct and showed hemorrhoids on  retroflexed view. The endoscope was straightened and withdrawn. The  patient's vital signs and pulse oximeter remained stable. The patient  tolerated the procedure well without apparent complications.   FINDINGS:  Polyp at the splenic flexure biopsied. Internal hemorrhoids  otherwise unremarkable examination.   PLAN:  Await biopsy report. The patient will call me for results and follow-  up with me as an outpatient.           ______________________________  Georgiana Spinner, M.D.     GMO/MEDQ  D:  08/12/2005  T:  08/12/2005  Job:  [id number]   cc:   Jeannett Senior A. Clent Ridges, M.D. [facility]  [address] [place]  [place] [address]

## 2010-12-05 NOTE — Op Note (Signed)
NAMELILLIAM, [name] [name]               ACCOUNT NO.:  [id number]   MEDICAL RECORD NO.:  [id number]          PATIENT TYPE:  AMB   LOCATION:  ENDO                         FACILITY:  [facility]   PHYSICIAN:  Georgiana Spinner, M.D.    DATE OF BIRTH:  Oct 01, 1933   DATE OF PROCEDURE:  12/07/2005  DATE OF DISCHARGE:                                 OPERATIVE REPORT   PROCEDURE:  Upper endoscopy.   INDICATIONS:  Abdominal discomfort.   ANESTHESIA:  Demerol 60, Versed 6 mg.   DESCRIPTION OF PROCEDURE:  With the patient mildly sedated in the left  lateral decubitus position, the Olympus videoscopic endoscope was inserted  into the mouth and passed under direct vision through the esophagus which  appeared normal.  We entered into the stomach and the fundus, body, antrum,  duodenal bulb, and second portion of the duodenum were visualized.  From  this point the endoscope was slowly withdrawn taking circumferential views  of duodenal mucosa until the endoscope had been pulled back into the  stomach; and placed in retroflexion to view the stomach from below.  The  endoscope was straightened, withdrawn, taking circumferential views of the  remaining gastric and esophageal mucosa stopping in the body of the stomach  where an eschar was seen, which was photographed and biopsied.  The  endoscope was withdrawn.  The patient's vital signs and pulse oximeter  remained stable.  The patient tolerated the procedure well without apparent  complications.   FINDINGS:  Healing ulcer with eschar.   PLAN:  Await biopsy report.  The patient will call me for results and follow  up with me as an outpatient.           ______________________________  Georgiana Spinner, M.D.     GMO/MEDQ  D:  12/07/2005  T:  12/07/2005  Job:  [id number]

## 2010-12-05 NOTE — Consult Note (Signed)
[facility]                          ENDOCRINOLOGY CONSULTATION   NAME:[name], [name] SEEDORF                      MRN:          [id number]  DATE:10/06/2006                            DOB:          04/07/1934    REASON FOR VISIT:  Followup diabetes.   HISTORY OF PRESENT ILLNESS:  A 77 year old woman who states that her  glucoses continue to be quite variable and she states there is no  pattern to them.   She has chronic pain of both hands which is worse at the MCPs of both  index fingers.   PAST MEDICAL HISTORY:  1. Dyslipidemia.  2. Hypertension.  3. Hypothyroidism.  4. Menopause.  5. Allergic rhinitis.   REVIEW OF SYSTEMS:  She is having occasional mild hypoglycemia in the  late evening.   PHYSICAL EXAMINATION:  VITAL SIGNS:  Blood pressure is 118/60, heart  rate is 65, temperature 98.1, the weight is 137.  GENERAL:  No distress.  HANDS:  She has slight swelling and tenderness of the MCPs of both index  fingers.   IMPRESSION:  1. Uncertain control of diabetes.  2. Arthralgias.   PLAN:  1. Refer to dietician at her request.  2. Change 70/30 insulin to 32 units q.a.m. and 16 units q.p.m.  3. Check x-ray of the right hand.  4. Check sed rate and rheumatoid factor.  5. Please make an appointment to followup the results of these studies      of your hands with Dr. Clent Ridges so I can drop back and take care of your      diabetes.  6. Return in about 30 days.     Sean A. Everardo All, MD  Electronically Signed   SAE/MedQ  DD: 10/08/2006  DT: 10/08/2006  Job #: [id number]   cc:   Jeannett Senior A. Clent Ridges, MD

## 2010-12-05 NOTE — Op Note (Signed)
   NAME:  [name] [name], [name] [name]                         ACCOUNT NO.:  [id number]   MEDICAL RECORD NO.:  [id number]                   PATIENT TYPE:  AMB   LOCATION:  ENDO                                 FACILITY:  [facility]   PHYSICIAN:  Georgiana Spinner, M.D.                 DATE OF BIRTH:  1934/01/23   DATE OF PROCEDURE:  04/13/2003  DATE OF DISCHARGE:                                 OPERATIVE REPORT   PROCEDURE:  Colonoscopy with biopsy   INDICATIONS:  Colon polyp.   ANESTHESIA:  1. Demerol 60 mg.  2. Versed 6 mg.   DESCRIPTION OF PROCEDURE:  With patient mildly sedated in the left lateral  decubitus position, the Olympus videoscopic endoscope was inserted in the  rectum, passed under direct vision to the cecum, identified by the ileocecal  valve and appendiceal orifice, both of which were photographed.  From this  point, the colonoscope was slowly withdrawn, taking circumferential views of  the colonic mucosa, stopping only at 15 cm from the anal verge, at which  point a small polyp was seen, photographed, and removed using hot biopsy  forceps technique, setting of 20-20 blended current.  The endoscope was then  withdrawn all the way to the rectum which appeared normal on direct and  retroflexed view.  The endoscope was straightened and withdrawn.  The  patient's vital signs and pulse oximeter remained stable.  The patient  tolerated the procedure well without apparent complications.   FINDINGS:  Small polyp at 15 cm from the anal verge.  Await biopsy report.  The patient will call me for results and follow up with me as an outpatient.                                               Georgiana Spinner, M.D.    GMO/MEDQ  D:  04/13/2003  T:  04/13/2003  Job:  [id number]

## 2010-12-05 NOTE — Op Note (Signed)
   NAME:  [name] [name], [name] [name]                         ACCOUNT NO.:  [id number]   MEDICAL RECORD NO.:  [id number]                   PATIENT TYPE:  AMB   LOCATION:  ENDO                                 FACILITY:  [facility]   PHYSICIAN:  Georgiana Spinner, M.D.                 DATE OF BIRTH:  02-Oct-1933   DATE OF PROCEDURE:  DATE OF DISCHARGE:                                 OPERATIVE REPORT   ADDENDUM:  Send carbon copy to Georgann Housekeeper, M.D.                                               Georgiana Spinner, M.D.    GMO/MEDQ  D:  04/13/2003  T:  04/13/2003  Job:  [id number]   cc:   Georgann Housekeeper, M.D.  [address]  [place]  [place] [address]  Fax: [phone]

## 2011-01-19 ENCOUNTER — Other Ambulatory Visit: Payer: Self-pay | Admitting: Gynecology

## 2011-02-23 ENCOUNTER — Emergency Department (HOSPITAL_COMMUNITY)
Admission: EM | Admit: 2011-02-23 | Discharge: 2011-02-23 | Discharge status: Against Medical Advice | Payer: Medicare Other | Attending: Emergency Medicine | Admitting: Emergency Medicine

## 2011-03-25 ENCOUNTER — Other Ambulatory Visit: Payer: Self-pay | Admitting: Internal Medicine

## 2011-03-25 DIAGNOSIS — Z1231 Encounter for screening mammogram for malignant neoplasm of breast: Secondary | ICD-10-CM

## 2011-04-15 ENCOUNTER — Ambulatory Visit
Admission: RE | Admit: 2011-04-15 | Discharge: 2011-04-15 | Disposition: A | Discharge status: Home / Self Care | Payer: Medicare Other | Source: Ambulatory Visit | Attending: Internal Medicine | Admitting: Internal Medicine

## 2011-04-15 DIAGNOSIS — Z1231 Encounter for screening mammogram for malignant neoplasm of breast: Secondary | ICD-10-CM

## 2011-08-28 ENCOUNTER — Emergency Department (INDEPENDENT_AMBULATORY_CARE_PROVIDER_SITE_OTHER)
Admission: EM | Admit: 2011-08-28 | Discharge: 2011-08-28 | Disposition: A | Discharge status: Home / Self Care | Payer: Medicare Other | Source: Home / Self Care | Attending: Emergency Medicine | Admitting: Emergency Medicine

## 2011-08-28 ENCOUNTER — Encounter (HOSPITAL_COMMUNITY): Payer: Self-pay | Admitting: Emergency Medicine

## 2011-08-28 DIAGNOSIS — J329 Chronic sinusitis, unspecified: Secondary | ICD-10-CM

## 2011-08-28 HISTORY — DX: Disorder of thyroid, unspecified: E07.9

## 2011-08-28 HISTORY — DX: Essential (primary) hypertension: I10

## 2011-08-28 HISTORY — DX: Hyperlipidemia, unspecified: E78.5

## 2011-08-28 MED ORDER — GUAIFENESIN ER 600 MG PO TB12: 600.0000 mg | ORAL_TABLET | Freq: Two times a day (BID) | ORAL | Status: AC

## 2011-08-28 MED ORDER — DOXYCYCLINE HYCLATE 100 MG PO CAPS: 100.0000 mg | ORAL_CAPSULE | Freq: Two times a day (BID) | ORAL | Status: AC

## 2011-08-28 NOTE — ED Notes (Signed)
Cough for 3 weeks, non-productive

## 2011-08-28 NOTE — ED Provider Notes (Signed)
History     CSN: [id number]  Arrival date & time 08/28/11  0347   First MD Initiated Contact with Patient 08/28/11 1003      Chief Complaint  Patient presents with  . Cough    (Consider location/radiation/quality/duration/timing/severity/associated sxs/prior treatment) HPI Comments: Pt with nasal congestion, postnasal drip, ST, nonproductive cough, bilateral frontal sinus pain/pressure worse with bending forward/lying down, ear fullness x 3 weeks, states her ears are beginning to hurt. No change in hearing. No fevers, N/V, other HA, bodyaches, dental pain, purulent nasal d/c. Taking Claritin, steroid nasal spray, Tylenol without relief No known sick contacts. Symptoms started off as flulike illness, which is larger resolved, however, patient complains of worsening nasal congestion and frontal headache. States feels simlar to prev episodes of sinusitis.   ROS as noted in HPI. All other ROS negative.    Patient is a 78 y.o. female presenting with sinusitis. The history is provided by the patient. No language interpreter was used.  Sinusitis  This is a new problem. The current episode started more than 1 week ago. The problem has not changed since onset.There has been no fever. She has tried acetaminophen for the symptoms. The treatment provided mild relief.    Past Medical History  Diagnosis Date  . Diabetes mellitus   . Hypertension   . Hyperlipidemia   . Sinusitis   . Thyroid disease     Past Surgical History  Procedure Date  . Abdominal hysterectomy     History reviewed. No pertinent family history.  History  Substance Use Topics  . Smoking status: Never Smoker   . Smokeless tobacco: Not on file  . Alcohol Use: No    OB History    Grav Para Term Preterm Abortions TAB SAB Ect Mult Living                  Review of Systems  Allergies  Sulfonamide derivatives  Home Medications   Current Outpatient Rx  Name Route Sig Dispense Refill  . ASPIRIN 81 MG PO TABS  Oral Take 160 mg by mouth daily.    Marland Kitchen BENZONATATE 200 MG PO CAPS Oral Take 200 mg by mouth 3 (three) times daily as needed.    . INSULIN ASPART 100 UNIT/ML [place] SOLN Subcutaneous Inject into the skin 3 (three) times daily before meals.    Marland Kitchen LEVOTHYROXINE SODIUM 75 MCG PO TABS Oral Take 75 mcg by mouth daily.    Marland Kitchen LISINOPRIL 10 MG PO TABS Oral Take 10 mg by mouth daily.    . MOMETASONE FUROATE 50 MCG/ACT NA SUSP Nasal Place 2 sprays into the nose daily.    Marland Kitchen DOXYCYCLINE HYCLATE 100 MG PO CAPS Oral Take 1 capsule (100 mg total) by mouth 2 (two) times daily. X 7 days 14 capsule 0  . GUAIFENESIN ER 600 MG PO TB12 Oral Take 1 tablet (600 mg total) by mouth 2 (two) times daily. 14 tablet 0  . METFORMIN HCL 1000 MG PO TABS  TAKE 1 TABLET TWICE DAILY 60 tablet 11    BP 159/73  Pulse 67  Temp(Src) 98.1 F (36.7 C) (Oral)  Resp 18  SpO2 98%  Physical Exam  Nursing note and vitals reviewed. Constitutional: She is oriented to person, place, and time. She appears well-developed and well-nourished.  HENT:  Head: Normocephalic and atraumatic.  Right Ear: Tympanic membrane and ear canal normal.  Left Ear: Tympanic membrane and ear canal normal.  Nose: Mucosal edema and rhinorrhea present. Right sinus  exhibits maxillary sinus tenderness and frontal sinus tenderness. Left sinus exhibits maxillary sinus tenderness and frontal sinus tenderness.  Mouth/Throat: Uvula is midline and mucous membranes are normal. Posterior oropharyngeal erythema present. No oropharyngeal exudate.       Purulent nasal discharge  Eyes: Conjunctivae and EOM are normal.  Neck: Normal range of motion. Neck supple.  Cardiovascular: Normal rate, regular rhythm and normal heart sounds.   Pulmonary/Chest: Effort normal and breath sounds normal.  Abdominal: She exhibits no distension.  Musculoskeletal: Normal range of motion.  Lymphadenopathy:    She has no cervical adenopathy.  Neurological: She is alert and oriented to person,  place, and time. She has normal strength. No cranial nerve deficit or sensory deficit.  Skin: Skin is warm and dry. No rash noted.  Psychiatric: She has a normal mood and affect. Her behavior is normal. Judgment and thought content normal.    ED Course  Procedures (including critical care time)  Labs Reviewed - No data to display No results found.   1. Sinusitis       MDM  Previous records reviewed. Additional medical history obtained from records.  Pt with indications for abx, has had symptoms for 3 weeks.. Will start  doxycycline in addition to flonase, mucinex, saline nasal irrigation, increase fluids, tylenol/motrin prn pain. Discussed MDM and plan with pt. Pt agrees with plan and will f/u with PMD prn.    Luiz Blare, MD 08/28/11 1031

## 2011-12-03 ENCOUNTER — Other Ambulatory Visit: Payer: Self-pay | Admitting: Internal Medicine

## 2011-12-03 DIAGNOSIS — M542 Cervicalgia: Secondary | ICD-10-CM

## 2011-12-05 ENCOUNTER — Ambulatory Visit
Admission: RE | Admit: 2011-12-05 | Discharge: 2011-12-05 | Disposition: A | Discharge status: Home / Self Care | Payer: Medicare Other | Source: Ambulatory Visit | Attending: Internal Medicine | Admitting: Internal Medicine

## 2011-12-05 DIAGNOSIS — M542 Cervicalgia: Secondary | ICD-10-CM

## 2012-03-16 ENCOUNTER — Other Ambulatory Visit: Payer: Self-pay | Admitting: Internal Medicine

## 2012-03-16 DIAGNOSIS — Z1231 Encounter for screening mammogram for malignant neoplasm of breast: Secondary | ICD-10-CM

## 2012-04-15 ENCOUNTER — Ambulatory Visit
Admission: RE | Admit: 2012-04-15 | Discharge: 2012-04-15 | Disposition: A | Discharge status: Home / Self Care | Payer: Medicare Other | Source: Ambulatory Visit | Attending: Internal Medicine | Admitting: Internal Medicine

## 2012-04-15 DIAGNOSIS — Z1231 Encounter for screening mammogram for malignant neoplasm of breast: Secondary | ICD-10-CM

## 2012-07-31 ENCOUNTER — Encounter (HOSPITAL_COMMUNITY): Payer: Self-pay | Admitting: *Deleted

## 2012-07-31 ENCOUNTER — Emergency Department (INDEPENDENT_AMBULATORY_CARE_PROVIDER_SITE_OTHER)
Admission: EM | Admit: 2012-07-31 | Discharge: 2012-07-31 | Disposition: A | Discharge status: Home / Self Care | Payer: Medicare Other | Source: Home / Self Care

## 2012-07-31 DIAGNOSIS — H9209 Otalgia, unspecified ear: Secondary | ICD-10-CM

## 2012-07-31 MED ORDER — ANTIPYRINE-BENZOCAINE 5.4-1.4 % OT SOLN: 3.0000 [drp] | OTIC | Status: DC | PRN

## 2012-07-31 MED ORDER — MOMETASONE FUROATE 50 MCG/ACT NA SUSP: 2.0000 | Freq: Every day | NASAL | Status: DC

## 2012-07-31 NOTE — ED Notes (Addendum)
Patient complains of head congestion, bilateral earache, dry throat in mornings with fever and chills x 2 weeks. Denies nausea, vomiting, diarrhea.

## 2012-07-31 NOTE — ED Provider Notes (Signed)
[name] [name] is a 79 y.o. female who presents to Urgent Care today for ear pain and mildly sore throat.  Present for the last several days. Patient was initially sick with a viral illness about 2 weeks ago. She developed ear pain and sore throat with dry cough A few days ago. She denies any fevers chills trouble breathing nausea vomiting diarrhea chest pain or palpitation,  she's tried some Advil which has worked. She feels well otherwise.   PMH reviewed. Diabetes History  Substance Use Topics  . Smoking status: Never Smoker   . Smokeless tobacco: Not on file  . Alcohol Use: No   ROS as above Medications reviewed. No current facility-administered medications for this encounter.   Current Outpatient Prescriptions  Medication Sig Dispense Refill  . antipyrine-benzocaine (AURALGAN) otic solution Place 3 drops into both ears every 2 (two) hours as needed for pain.  10 mL  0  . aspirin 81 MG tablet Take 160 mg by mouth daily.      . benzonatate (TESSALON) 200 MG capsule Take 200 mg by mouth 3 (three) times daily as needed.      Marland Kitchen guaiFENesin (MUCINEX) 600 MG 12 hr tablet Take 1 tablet (600 mg total) by mouth 2 (two) times daily.  14 tablet  0  . insulin aspart (NOVOLOG) 100 UNIT/ML injection Inject into the skin 3 (three) times daily before meals.      Marland Kitchen levothyroxine (SYNTHROID, LEVOTHROID) 75 MCG tablet Take 75 mcg by mouth daily.      Marland Kitchen lisinopril (PRINIVIL,ZESTRIL) 10 MG tablet Take 10 mg by mouth daily.      . metFORMIN (GLUCOPHAGE) 1000 MG tablet TAKE 1 TABLET TWICE DAILY  60 tablet  11  . mometasone (NASONEX) 50 MCG/ACT nasal spray Place 2 sprays into the nose daily.  17 g  0    Exam:  BP 199/76  Pulse 70  Temp 98.3 F (36.8 C) (Oral)  Resp 17  SpO2 99% Gen: Well NAD HEENT: EOMI,  MMM, tympanic membranes bilaterally are retracted with no erythema or exudate. Posterior pharynx is mildly erythematous with cobblestoning.  No anterior cervical lymphadenopathy Lungs: CTABL Nl  WOB Heart: RRR no MRG Abd: NABS, NT, ND Exts: Non edematous BL  LE, warm and well perfused.   No results found for this or any previous visit (from the past 24 hour(s)). No results found.  Assessment and Plan: 79 y.o. female with ear pain following URI secondary to retracted tympanic membranes. Additionally patient has cobblestoning in the posterior pharynx.  This may be a second URI or sequelae from the first.  Plan: As patient is well will prescribe Auralgan topical eardrops and refill Nasonex nasal spray. Discussed warning signs or symptoms. Please see discharge instructions. Patient expresses understanding. F/u PRN     Rodolph Bong, MD 07/31/12 1526

## 2012-07-31 NOTE — ED Notes (Signed)
Patient states that she no longer uses [facility]. Waiting to have provider change Rx to [facility] on [address]

## 2012-07-31 NOTE — ED Provider Notes (Signed)
Medical screening examination/treatment/procedure(s) were performed by a resident physician and as supervising physician I was immediately available for consultation/collaboration.  Leslee Home, M.D.   Reuben Likes, MD 07/31/12 [phone]

## 2013-01-23 ENCOUNTER — Emergency Department (HOSPITAL_COMMUNITY): Payer: Medicare Other

## 2013-01-23 ENCOUNTER — Emergency Department (HOSPITAL_COMMUNITY)
Admission: EM | Admit: 2013-01-23 | Discharge: 2013-01-23 | Disposition: A | Discharge status: Home / Self Care | Payer: Medicare Other | Attending: Emergency Medicine | Admitting: Emergency Medicine

## 2013-01-23 ENCOUNTER — Encounter (HOSPITAL_COMMUNITY): Payer: Self-pay

## 2013-01-23 DIAGNOSIS — M549 Dorsalgia, unspecified: Secondary | ICD-10-CM | POA: Insufficient documentation

## 2013-01-23 DIAGNOSIS — E162 Hypoglycemia, unspecified: Secondary | ICD-10-CM | POA: Insufficient documentation

## 2013-01-23 DIAGNOSIS — Z8709 Personal history of other diseases of the respiratory system: Secondary | ICD-10-CM | POA: Insufficient documentation

## 2013-01-23 DIAGNOSIS — Z7982 Long term (current) use of aspirin: Secondary | ICD-10-CM | POA: Insufficient documentation

## 2013-01-23 DIAGNOSIS — E785 Hyperlipidemia, unspecified: Secondary | ICD-10-CM | POA: Insufficient documentation

## 2013-01-23 DIAGNOSIS — Z794 Long term (current) use of insulin: Secondary | ICD-10-CM | POA: Insufficient documentation

## 2013-01-23 DIAGNOSIS — E1169 Type 2 diabetes mellitus with other specified complication: Secondary | ICD-10-CM | POA: Insufficient documentation

## 2013-01-23 DIAGNOSIS — Z79899 Other long term (current) drug therapy: Secondary | ICD-10-CM | POA: Insufficient documentation

## 2013-01-23 DIAGNOSIS — I10 Essential (primary) hypertension: Secondary | ICD-10-CM | POA: Insufficient documentation

## 2013-01-23 DIAGNOSIS — E079 Disorder of thyroid, unspecified: Secondary | ICD-10-CM | POA: Insufficient documentation

## 2013-01-23 LAB — URINALYSIS, ROUTINE W REFLEX MICROSCOPIC
Glucose, UA: NEGATIVE mg/dL
Ketones, ur: NEGATIVE mg/dL
Leukocytes, UA: NEGATIVE
pH: 5.5 (ref 5.0–8.0)

## 2013-01-23 LAB — CBC WITH DIFFERENTIAL/PLATELET
HCT: 33.9 % — ABNORMAL LOW (ref 36.0–46.0)
Hemoglobin: 11.2 g/dL — ABNORMAL LOW (ref 12.0–15.0)
Lymphocytes Relative: 29 % (ref 12–46)
Monocytes Absolute: 0.5 10*3/uL (ref 0.1–1.0)
Monocytes Relative: 9 % (ref 3–12)
Neutro Abs: 3.7 10*3/uL (ref 1.7–7.7)
WBC: 6.1 10*3/uL (ref 4.0–10.5)

## 2013-01-23 LAB — BASIC METABOLIC PANEL
BUN: 15 mg/dL (ref 6–23)
CO2: 30 mEq/L (ref 19–32)
Chloride: 100 mEq/L (ref 96–112)
Creatinine, Ser: 0.88 mg/dL (ref 0.50–1.10)

## 2013-01-23 MED ORDER — TRAMADOL HCL 50 MG PO TABS: 50.0000 mg | ORAL_TABLET | Freq: Four times a day (QID) | ORAL | Status: DC | PRN

## 2013-01-23 NOTE — ED Notes (Signed)
Patient transported to X-ray 

## 2013-01-23 NOTE — ED Notes (Signed)
Patient c/o lower back pain and bilateral leg swelling that began 4 months ago intermittently but is worse in the past few days. Patient has a reddened area to the achilles area x 1 month and has been applying cream, but does not know the name of the med.. Patient also c/o dysuria and urinary frequency.

## 2013-01-23 NOTE — ED Provider Notes (Addendum)
History    CSN: [id number] Arrival date & time 01/23/13  1317  First MD Initiated Contact with Patient 01/23/13 1511     Chief Complaint  Patient presents with  . Leg Swelling  . Back Pain   (Consider location/radiation/quality/duration/timing/severity/associated sxs/prior Treatment) Patient is a 79 y.o. female presenting with back pain. The history is provided by the patient.  Back Pain Associated symptoms: dysuria   Associated symptoms: no abdominal pain, no fever and no headaches    patient with several day to weeks complaint of dysuria and urinary frequency. Patient also with several month complaint of bilateral leg swelling. In the complaint of back pain. The healing wound on the back of her leg the patient refers to in the nursing thing was then opened wound in the past and is now healed. Patient denies shortness of breath or chest pain. The back pain is 4/10 radiates into both upper thighs. Pain is described as an ache. Made worse with movement. Past Medical History  Diagnosis Date  . Diabetes mellitus   . Hypertension   . Hyperlipidemia   . Sinusitis   . Thyroid disease    Past Surgical History  Procedure Laterality Date  . Abdominal hysterectomy    . Knee arthroscopy     Family History  Problem Relation Age of Onset  . Diabetes Mother   . Diabetes Sister   . Diabetes Brother    History  Substance Use Topics  . Smoking status: Never Smoker   . Smokeless tobacco: Never Used  . Alcohol Use: No   OB History   Grav Para Term Preterm Abortions TAB SAB Ect Mult Living                 Review of Systems  Constitutional: Negative for fever.  HENT: Negative for congestion.   Eyes: Negative for visual disturbance.  Respiratory: Negative for cough and shortness of breath.   Cardiovascular: Positive for leg swelling.  Gastrointestinal: Negative for nausea, vomiting and abdominal pain.  Genitourinary: Positive for dysuria and frequency.  Musculoskeletal: Positive  for back pain.  Skin: Negative for rash.  Neurological: Negative for headaches.  Hematological: Does not bruise/bleed easily.  Psychiatric/Behavioral: Negative for confusion.    Allergies  Sulfonamide derivatives  Home Medications   Current Outpatient Rx  Name  Route  Sig  Dispense  Refill  . antipyrine-benzocaine (AURALGAN) otic solution   Both Ears   Place 3 drops into both ears every 2 (two) hours as needed for pain.   10 mL   0   . aspirin EC 81 MG tablet   Oral   Take 81 mg by mouth daily.         . insulin aspart (NOVOLOG) 100 UNIT/ML injection   Subcutaneous   Inject 6-10 Units into the skin 3 (three) times daily before meals.          . insulin glargine (LANTUS) 100 UNIT/ML injection   Subcutaneous   Inject 8 Units into the skin 2 (two) times daily.         Marland Kitchen levothyroxine (SYNTHROID, LEVOTHROID) 75 MCG tablet   Oral   Take 75 mcg by mouth daily.         Marland Kitchen lisinopril (PRINIVIL,ZESTRIL) 10 MG tablet   Oral   Take 10 mg by mouth daily.         . metFORMIN (GLUCOPHAGE) 1000 MG tablet   Oral   Take 1,000 mg by mouth 2 (two) times daily with  a meal.         . simvastatin (ZOCOR) 40 MG tablet   Oral   Take 40 mg by mouth every morning.         . traMADol (ULTRAM) 50 MG tablet   Oral   Take 1 tablet (50 mg total) by mouth every 6 (six) hours as needed for pain.   15 tablet   0    BP 172/71  Pulse 71  Temp(Src) 98.1 F (36.7 C) (Oral)  Resp 16  Ht 5\' 7"  (1.702 m)  Wt 150 lb (68.04 kg)  BMI 23.49 kg/m2  SpO2 99% Physical Exam  Nursing note and vitals reviewed. Constitutional: She is oriented to person, place, and time. She appears well-developed and well-nourished.  HENT:  Head: Normocephalic and atraumatic.  Mouth/Throat: Oropharynx is clear and moist.  Eyes: Conjunctivae and EOM are normal. Pupils are equal, round, and reactive to light.  Neck: Normal range of motion. Neck supple.  Cardiovascular: Normal rate, regular rhythm and  normal heart sounds.   Pulmonary/Chest: Effort normal and breath sounds normal. No respiratory distress. She has no wheezes. She has no rales.  Abdominal: Soft. Bowel sounds are normal. There is no tenderness.  Musculoskeletal: Normal range of motion. She exhibits edema.  Very slight bilateral lower trimming the edema. No pitting edema.  Neurological: She is alert and oriented to person, place, and time. No cranial nerve deficit. She exhibits normal muscle tone. Coordination normal.  Skin: Skin is warm. No rash noted.    ED Course  Procedures (including critical care time) Labs Reviewed  CBC WITH DIFFERENTIAL - Abnormal; Notable for the following:    RBC 3.69 (*)    Hemoglobin 11.2 (*)    HCT 33.9 (*)    All other components within normal limits  BASIC METABOLIC PANEL - Abnormal; Notable for the following:    Glucose, Bld 102 (*)    Calcium 10.7 (*)    GFR calc non Af Amer 62 (*)    GFR calc Af Amer 72 (*)    All other components within normal limits  GLUCOSE, CAPILLARY - Abnormal; Notable for the following:    Glucose-Capillary 49 (*)    All other components within normal limits  URINALYSIS, ROUTINE W REFLEX MICROSCOPIC  GLUCOSE, CAPILLARY   Results for orders placed during the hospital encounter of 01/23/13  URINALYSIS, ROUTINE W REFLEX MICROSCOPIC      Result Value Range   Color, Urine YELLOW  YELLOW   APPearance CLEAR  CLEAR   Specific Gravity, Urine 1.013  1.005 - 1.030   pH 5.5  5.0 - 8.0   Glucose, UA NEGATIVE  NEGATIVE mg/dL   Hgb urine dipstick NEGATIVE  NEGATIVE   Bilirubin Urine NEGATIVE  NEGATIVE   Ketones, ur NEGATIVE  NEGATIVE mg/dL   Protein, ur NEGATIVE  NEGATIVE mg/dL   Urobilinogen, UA 0.2  0.0 - 1.0 mg/dL   Nitrite NEGATIVE  NEGATIVE   Leukocytes, UA NEGATIVE  NEGATIVE  CBC WITH DIFFERENTIAL      Result Value Range   WBC 6.1  4.0 - 10.5 K/uL   RBC 3.69 (*) 3.87 - 5.11 MIL/uL   Hemoglobin 11.2 (*) 12.0 - 15.0 g/dL   HCT 16.1 (*) 09.6 - 04.5 %   MCV  91.9  78.0 - 100.0 fL   MCH 30.4  26.0 - 34.0 pg   MCHC 33.0  30.0 - 36.0 g/dL   RDW 40.9  81.1 - 91.4 %   Platelets  317  150 - 400 K/uL   Neutrophils Relative % 61  43 - 77 %   Neutro Abs 3.7  1.7 - 7.7 K/uL   Lymphocytes Relative 29  12 - 46 %   Lymphs Abs 1.8  0.7 - 4.0 K/uL   Monocytes Relative 9  3 - 12 %   Monocytes Absolute 0.5  0.1 - 1.0 K/uL   Eosinophils Relative 1  0 - 5 %   Eosinophils Absolute 0.1  0.0 - 0.7 K/uL   Basophils Relative 0  0 - 1 %   Basophils Absolute 0.0  0.0 - 0.1 K/uL  BASIC METABOLIC PANEL      Result Value Range   Sodium 138  135 - 145 mEq/L   Potassium 4.0  3.5 - 5.1 mEq/L   Chloride 100  96 - 112 mEq/L   CO2 30  19 - 32 mEq/L   Glucose, Bld 102 (*) 70 - 99 mg/dL   BUN 15  6 - 23 mg/dL   Creatinine, Ser 1.61  0.50 - 1.10 mg/dL   Calcium 09.6 (*) 8.4 - 10.5 mg/dL   GFR calc non Af Amer 62 (*) >90 mL/min   GFR calc Af Amer 72 (*) >90 mL/min  GLUCOSE, CAPILLARY      Result Value Range   Glucose-Capillary 49 (*) 70 - 99 mg/dL  GLUCOSE, CAPILLARY      Result Value Range   Glucose-Capillary 99  70 - 99 mg/dL     Dg Chest 1 View  0/10/5407   *RADIOLOGY REPORT*  Clinical Data: Probable left nipple shadow on the chest radiograph earlier today.  CHEST - 1 VIEW  Comparison: Earlier today.  Findings: Normal sized heart.  Clear lungs.  The lungs remain mildly hyperexpanded with minimal diffuse peribronchial thickening and accentuation of the interstitial markings.  The current nipple markers confirm that the nodular density previously seen on the left was the left nipple.  Mild scoliosis.  IMPRESSION:  1.  The previously seen nodular density was the left nipple. 2.  Mild changes of COPD and chronic bronchitis.   Original Report Authenticated By: Beckie Salts, M.D.   Dg Chest 2 View  01/23/2013   *RADIOLOGY REPORT*  Clinical Data: Coughing  CHEST - 2 VIEW  Comparison: 08/07/2009  Findings: Two-view exam shows emphysema without airspace consolidation or edema.   No pleural effusion. The cardiopericardial silhouette is within normal limits for size.  Nodular density overlying the left base is almost assuredly a nipple shadow. Degenerative changes noted in the shoulders with evidence of calcific tendonitis bilaterally.  IMPRESSION: Hyperexpansion without acute cardiopulmonary findings.  Nodular density at the left base is almost assuredly a nipple shadow, but repeat frontal radiograph with nipple markers recommended to confirm.   Original Report Authenticated By: Kennith Center, M.D.   Dg Lumbar Spine Complete  01/23/2013   *RADIOLOGY REPORT*  Clinical Data: Low back pain for the past 6 months.  No known injury.  LUMBAR SPINE - COMPLETE 4+ VIEW  Comparison: Abdomen pelvis CT dated 12/04/2005.  Findings: Five non-rib bearing lumbar vertebrae.  Mild scoliosis. Marked right lateral disc space narrowing with a vacuum phenomenon and discogenic sclerosis at the L4-5 level, representing a change from the previous CT.  There is also an interval vacuum phenomenon at the L3-4 level.  No fractures, pars defects or subluxations. Atheromatous arterial calcification.  IMPRESSION: Progressive degenerative changes, as described above.   Original Report Authenticated By: Beckie Salts, M.D.   1. Back  pain   2. Hypoglycemia     MDM  Patient with complaint of dysuria concern for urinary tract infection. Urinalysis was negative for this. Patient does have some mild back discomfort may be lumbar degenerative changes as noted on the x-ray of the back. Will treat with tramadol. Chest x-ray was negative this was done to help evaluate concern for swelling in her ankles which was very minimal today. No evidence of pulmonary edema. Kidney function is normal. Patient will followup with her primary care Dr.  During the stay patient is a known diabetic has not had any food her blood sugar dropped down to 49. With food he came back up into the 9900 range. Patient clinically is stable and can be  discharged home.  Shelda Jakes, MD 01/23/13 Flossie Buffy  Shelda Jakes, MD 01/23/13 [phone]

## 2013-02-24 ENCOUNTER — Emergency Department (INDEPENDENT_AMBULATORY_CARE_PROVIDER_SITE_OTHER): Payer: Medicare Other

## 2013-02-24 ENCOUNTER — Emergency Department (HOSPITAL_COMMUNITY)
Admission: EM | Admit: 2013-02-24 | Discharge: 2013-02-24 | Disposition: A | Discharge status: Home / Self Care | Payer: Medicare Other | Source: Home / Self Care | Attending: Family Medicine | Admitting: Family Medicine

## 2013-02-24 ENCOUNTER — Encounter (HOSPITAL_COMMUNITY): Payer: Self-pay

## 2013-02-24 DIAGNOSIS — M47817 Spondylosis without myelopathy or radiculopathy, lumbosacral region: Secondary | ICD-10-CM

## 2013-02-24 DIAGNOSIS — M47816 Spondylosis without myelopathy or radiculopathy, lumbar region: Secondary | ICD-10-CM

## 2013-02-24 MED ORDER — TRAMADOL HCL 50 MG PO TABS: 50.0000 mg | ORAL_TABLET | Freq: Four times a day (QID) | ORAL | Status: DC | PRN

## 2013-02-24 NOTE — ED Provider Notes (Signed)
CSN: [id number]     Arrival date & time 02/24/13  1008 History     First MD Initiated Contact with Patient 02/24/13 1017     Chief Complaint  Patient presents with  . Back Pain   (Consider location/radiation/quality/duration/timing/severity/associated sxs/prior Treatment) Patient is a 79 y.o. female presenting with back pain. The history is provided by the patient.  Back Pain Location:  Lumbar spine Quality:  Shooting and stiffness Stiffness is present:  In the morning Radiates to:  R posterior upper leg and L posterior upper leg Pain severity:  Moderate Onset quality:  Gradual Duration:  2 months Progression:  Unchanged Chronicity:  Chronic   Past Medical History  Diagnosis Date  . Diabetes mellitus   . Hypertension   . Hyperlipidemia   . Sinusitis   . Thyroid disease    Past Surgical History  Procedure Laterality Date  . Abdominal hysterectomy    . Knee arthroscopy     Family History  Problem Relation Age of Onset  . Diabetes Mother   . Diabetes Sister   . Diabetes Brother    History  Substance Use Topics  . Smoking status: Never Smoker   . Smokeless tobacco: Never Used  . Alcohol Use: No   OB History   Grav Para Term Preterm Abortions TAB SAB Ect Mult Living                 Review of Systems  Constitutional: Negative.   Gastrointestinal: Negative.   Musculoskeletal: Positive for back pain and arthralgias. Negative for joint swelling and gait problem.  Skin: Negative.     Allergies  Sulfonamide derivatives  Home Medications   Current Outpatient Rx  Name  Route  Sig  Dispense  Refill  . antipyrine-benzocaine (AURALGAN) otic solution   Both Ears   Place 3 drops into both ears every 2 (two) hours as needed for pain.   10 mL   0   . aspirin EC 81 MG tablet   Oral   Take 81 mg by mouth daily.         . insulin aspart (NOVOLOG) 100 UNIT/ML injection   Subcutaneous   Inject 6-10 Units into the skin 3 (three) times daily before meals.         . insulin glargine (LANTUS) 100 UNIT/ML injection   Subcutaneous   Inject 8 Units into the skin 2 (two) times daily.         Marland Kitchen levothyroxine (SYNTHROID, LEVOTHROID) 75 MCG tablet   Oral   Take 75 mcg by mouth daily.         Marland Kitchen lisinopril (PRINIVIL,ZESTRIL) 10 MG tablet   Oral   Take 10 mg by mouth daily.         . metFORMIN (GLUCOPHAGE) 1000 MG tablet   Oral   Take 1,000 mg by mouth 2 (two) times daily with a meal.         . simvastatin (ZOCOR) 40 MG tablet   Oral   Take 40 mg by mouth every morning.         . traMADol (ULTRAM) 50 MG tablet   Oral   Take 1 tablet (50 mg total) by mouth every 6 (six) hours as needed for pain.   15 tablet   0   . traMADol (ULTRAM) 50 MG tablet   Oral   Take 1 tablet (50 mg total) by mouth every 6 (six) hours as needed for pain.   20 tablet   0  BP 140/51  Pulse 69  Temp(Src) 97.8 F (36.6 C) (Oral)  Resp 16  SpO2 100% Physical Exam  Nursing note and vitals reviewed. Constitutional: She is oriented to person, place, and time. She appears well-developed and well-nourished.  Abdominal: Soft. Bowel sounds are normal.  Musculoskeletal: She exhibits tenderness.       Lumbar back: She exhibits bony tenderness, pain, spasm and abnormal pulse.  Neurological: She is alert and oriented to person, place, and time.  Skin: Skin is warm and dry.  Stasis dermatitis changes, weak pulses 1+, tr edema,     ED Course   Procedures (including critical care time)  Labs Reviewed - No data to display Dg Lumbar Spine Complete  02/24/2013   *RADIOLOGY REPORT*  Clinical Data: v low back pain  LUMBAR SPINE - COMPLETE 4+ VIEW  Comparison: 01/23/2013  Findings: No evidence for fracture.  Bones are diffusely demineralized.  There is loss of intervertebral disc height at L3-4 or and L4-5.  Degenerative endplate sclerotic changes are seen around the L4-5 interspace.  There is some facet osteoarthritis at lower lumbar levels.  SI joints are  unremarkable.  IMPRESSION: No substantial interval change.  Degenerative changes in the lower lumbar spine as before.   Original Report Authenticated By: Kennith Center, M.D.   1. Degenerative arthritis of lumbar spine     MDM  X-rays reviewed and report per radiologist.   Linna Hoff, MD 02/24/13 1116

## 2013-02-24 NOTE — ED Notes (Signed)
Pain x 9 months

## 2013-03-28 ENCOUNTER — Encounter (HOSPITAL_COMMUNITY): Payer: Self-pay | Admitting: Emergency Medicine

## 2013-03-28 ENCOUNTER — Emergency Department (HOSPITAL_COMMUNITY): Payer: Medicare Other

## 2013-03-28 ENCOUNTER — Emergency Department (HOSPITAL_COMMUNITY)
Admission: EM | Admit: 2013-03-28 | Discharge: 2013-03-28 | Disposition: A | Discharge status: Home / Self Care | Payer: Medicare Other | Attending: Emergency Medicine | Admitting: Emergency Medicine

## 2013-03-28 DIAGNOSIS — I10 Essential (primary) hypertension: Secondary | ICD-10-CM | POA: Insufficient documentation

## 2013-03-28 DIAGNOSIS — R55 Syncope and collapse: Secondary | ICD-10-CM | POA: Insufficient documentation

## 2013-03-28 DIAGNOSIS — Z7982 Long term (current) use of aspirin: Secondary | ICD-10-CM | POA: Insufficient documentation

## 2013-03-28 DIAGNOSIS — E079 Disorder of thyroid, unspecified: Secondary | ICD-10-CM | POA: Insufficient documentation

## 2013-03-28 DIAGNOSIS — Z794 Long term (current) use of insulin: Secondary | ICD-10-CM | POA: Insufficient documentation

## 2013-03-28 DIAGNOSIS — E119 Type 2 diabetes mellitus without complications: Secondary | ICD-10-CM | POA: Insufficient documentation

## 2013-03-28 DIAGNOSIS — R110 Nausea: Secondary | ICD-10-CM | POA: Insufficient documentation

## 2013-03-28 DIAGNOSIS — R197 Diarrhea, unspecified: Secondary | ICD-10-CM | POA: Insufficient documentation

## 2013-03-28 DIAGNOSIS — R531 Weakness: Secondary | ICD-10-CM

## 2013-03-28 DIAGNOSIS — R42 Dizziness and giddiness: Secondary | ICD-10-CM | POA: Insufficient documentation

## 2013-03-28 DIAGNOSIS — N390 Urinary tract infection, site not specified: Secondary | ICD-10-CM | POA: Insufficient documentation

## 2013-03-28 DIAGNOSIS — Z79899 Other long term (current) drug therapy: Secondary | ICD-10-CM | POA: Insufficient documentation

## 2013-03-28 DIAGNOSIS — E860 Dehydration: Secondary | ICD-10-CM | POA: Insufficient documentation

## 2013-03-28 DIAGNOSIS — E785 Hyperlipidemia, unspecified: Secondary | ICD-10-CM | POA: Insufficient documentation

## 2013-03-28 DIAGNOSIS — R5381 Other malaise: Secondary | ICD-10-CM | POA: Insufficient documentation

## 2013-03-28 DIAGNOSIS — R0789 Other chest pain: Secondary | ICD-10-CM | POA: Insufficient documentation

## 2013-03-28 LAB — COMPREHENSIVE METABOLIC PANEL
AST: 22 U/L (ref 0–37)
Albumin: 3.6 g/dL (ref 3.5–5.2)
BUN: 25 mg/dL — ABNORMAL HIGH (ref 6–23)
Calcium: 10.3 mg/dL (ref 8.4–10.5)
Chloride: 95 mEq/L — ABNORMAL LOW (ref 96–112)
Creatinine, Ser: 1.11 mg/dL — ABNORMAL HIGH (ref 0.50–1.10)
Total Bilirubin: 0.4 mg/dL (ref 0.3–1.2)
Total Protein: 7.3 g/dL (ref 6.0–8.3)

## 2013-03-28 LAB — URINALYSIS, ROUTINE W REFLEX MICROSCOPIC
Glucose, UA: NEGATIVE mg/dL
Ketones, ur: 15 mg/dL — AB
Nitrite: NEGATIVE
pH: 6 (ref 5.0–8.0)

## 2013-03-28 LAB — CBC WITH DIFFERENTIAL/PLATELET
Eosinophils Absolute: 0.1 10*3/uL (ref 0.0–0.7)
Hemoglobin: 11.9 g/dL — ABNORMAL LOW (ref 12.0–15.0)
Lymphs Abs: 1.8 10*3/uL (ref 0.7–4.0)
MCH: 30.7 pg (ref 26.0–34.0)
MCV: 88.4 fL (ref 78.0–100.0)
Monocytes Relative: 6 % (ref 3–12)
Neutrophils Relative %: 72 % (ref 43–77)
RBC: 3.88 MIL/uL (ref 3.87–5.11)

## 2013-03-28 LAB — OCCULT BLOOD, POC DEVICE: Fecal Occult Bld: NEGATIVE

## 2013-03-28 LAB — TROPONIN I: Troponin I: <0.3 ng/mL (ref <0.30)

## 2013-03-28 LAB — SAMPLE TO BLOOD BANK

## 2013-03-28 MED ORDER — SODIUM CHLORIDE 0.9 % IV SOLN
1000.0000 mL | INTRAVENOUS | Status: DC
  Administered 2013-03-28: 1000 mL via INTRAVENOUS

## 2013-03-28 MED ORDER — CEPHALEXIN 500 MG PO CAPS: 500.0000 mg | ORAL_CAPSULE | Freq: Three times a day (TID) | ORAL | Status: DC

## 2013-03-28 MED ORDER — ONDANSETRON HCL 4 MG PO TABS: 4.0000 mg | ORAL_TABLET | Freq: Three times a day (TID) | ORAL | Status: DC | PRN

## 2013-03-28 MED ORDER — SODIUM CHLORIDE 0.9 % IV SOLN
1000.0000 mL | Freq: Once | INTRAVENOUS | Status: AC
  Administered 2013-03-28: 1000 mL via INTRAVENOUS

## 2013-03-28 NOTE — ED Notes (Signed)
Pt ambulated to bathroom with no assist. 

## 2013-03-28 NOTE — ED Provider Notes (Signed)
CSN: [id number]     Arrival date & time 03/28/13  0804 History   First MD Initiated Contact with Patient 03/28/13 0805     Chief Complaint  Patient presents with  . Chest Pain  . Nausea  . Diarrhea   (Consider location/radiation/quality/duration/timing/severity/associated sxs/prior Treatment) HPI Patient reports yesterday she started feeling sleepy. She also reports a dry throat. She states about 5:30 this morning she woke up and she had a discomfort in her upper chest that went into her shoulders and down into her forearms and into the back of her neck. She describes the pain as "dry and pressure". She states she's never had it before. She states she checked her CBG which was 114 and her blood pressure which was 137/70. She states she felt dizzy which means she felt weak like she would pass out. She states she felt that way even when she was sitting. However when she stands up the filling gets more intense. She states she was walking from her bedroom and felt like she was going to pass out and sat down on the floor. She states it took about 25 minutes before she felt good enough to get up. She states she had a diaphoretic episode while she was lying on the floor. She started having nausea this morning without vomiting. She also had diarrhea 2 times this morning. She denies stools being bloody or black. She states she had a normal appetite yesterday. She denies any abdominal pain. She reports she has some shortness of breath and has had chills. She denies cough, sore throat, rhinorrhea, abdominal pain, black or tarry stools, bloody stools,  or swelling of her legs. She denies any prior history of heart problems. She denies being around anybody else who is ill. She states her chest pain at its worst was a 8/10 and currently is a 6/10.  Family history she denies any history of cardiac problems including heart attacks, pacemakers, congestive heart failure.  PCP Dr Donette Larry  Past Medical History    Diagnosis Date  . Diabetes mellitus   . Hypertension   . Hyperlipidemia   . Sinusitis   . Thyroid disease    Past Surgical History  Procedure Laterality Date  . Abdominal hysterectomy    . Knee arthroscopy     Family History  Problem Relation Age of Onset  . Diabetes Mother   . Diabetes Sister   . Diabetes Brother    History  Substance Use Topics  . Smoking status: Never Smoker   . Smokeless tobacco: Never Used  . Alcohol Use: No  lives at home Lives alone   [place] History   Grav Para Term Preterm Abortions TAB SAB Ect Mult Living                 Review of Systems  All other systems reviewed and are negative.    Allergies  Sulfonamide derivatives  Home Medications   Current Outpatient Rx  Name  Route  Sig  Dispense  Refill  . alendronate (FOSAMAX) 70 MG tablet   Oral   Take 70 mg by mouth every 7 (seven) days.          Marland Kitchen aspirin EC 81 MG tablet   Oral   Take 81 mg by mouth daily.         . insulin aspart (NOVOLOG) 100 UNIT/ML injection   Subcutaneous   Inject 6-10 Units into the skin 3 (three) times daily before meals.          Marland Kitchen  insulin glargine (LANTUS) 100 UNIT/ML injection   Subcutaneous   Inject 8 Units into the skin 2 (two) times daily.         Marland Kitchen levothyroxine (SYNTHROID, LEVOTHROID) 75 MCG tablet   Oral   Take 75 mcg by mouth daily.         Marland Kitchen lisinopril (PRINIVIL,ZESTRIL) 10 MG tablet   Oral   Take 10 mg by mouth daily.         Marland Kitchen loratadine (CLARITIN) 10 MG tablet   Oral   Take 10 mg by mouth daily.         . metFORMIN (GLUCOPHAGE) 1000 MG tablet   Oral   Take 1,000 mg by mouth 2 (two) times daily with a meal.         . simvastatin (ZOCOR) 40 MG tablet   Oral   Take 40 mg by mouth at bedtime.          . traMADol (ULTRAM) 50 MG tablet   Oral   Take 1 tablet (50 mg total) by mouth every 6 (six) hours as needed for pain.   20 tablet   0   . ondansetron (ZOFRAN) 4 MG tablet   Oral   Take 1 tablet (4 mg total)  by mouth every 8 (eight) hours as needed for nausea.   8 tablet   0    BP 151/65  Pulse 65  Temp(Src) 98.1 F (36.7 C) (Oral)  Resp 18  SpO2 100%  Vital signs normal   Physical Exam  Nursing note and vitals reviewed. Constitutional: She is oriented to person, place, and time. She appears well-developed and well-nourished.  Non-toxic appearance. She does not appear ill. No distress.  HENT:  Head: Normocephalic and atraumatic.  Right Ear: External ear normal.  Left Ear: External ear normal.  Nose: Nose normal. No mucosal edema or rhinorrhea.  Mouth/Throat: Mucous membranes are normal. No dental abscesses or edematous.  Dry tongue  Eyes: Conjunctivae and EOM are normal. Pupils are equal, round, and reactive to light.  Neck: Normal range of motion and full passive range of motion without pain. Neck supple.  Cardiovascular: Normal rate, regular rhythm and normal heart sounds.  Exam reveals no gallop and no friction rub.   No murmur heard. Pulmonary/Chest: Effort normal and breath sounds normal. No respiratory distress. She has no wheezes. She has no rhonchi. She has no rales. She exhibits no tenderness and no crepitus.    Area of chest discomfort noted  Abdominal: Soft. Normal appearance and bowel sounds are normal. She exhibits no distension. There is no tenderness. There is no rebound and no guarding.  Musculoskeletal: Normal range of motion. She exhibits no edema and no tenderness.  Moves all extremities well.   Neurological: She is alert and oriented to person, place, and time. She has normal strength. No cranial nerve deficit.  Skin: Skin is warm, dry and intact. No rash noted. No erythema. There is pallor.  Psychiatric: She has a normal mood and affect. Her speech is normal and behavior is normal. Her mood appears not anxious.    ED Course  Procedures (including critical care time)  Medications  0.9 %  sodium chloride infusion (1,000 mLs Intravenous New Bag/Given 03/28/13  1313)    Followed by  0.9 %  sodium chloride infusion (1,000 mLs Intravenous New Bag/Given 03/28/13 1313)    Pt ate crackers without problems  Orthostatic VS were normal  Pt able to ambulate without difficulty.   Labs Review  Results for orders placed during the hospital encounter of 03/28/13  CBC WITH DIFFERENTIAL      Result Value Range   WBC 8.3  4.0 - 10.5 K/uL   RBC 3.88  3.87 - 5.11 MIL/uL   Hemoglobin 11.9 (*) 12.0 - 15.0 g/dL   HCT 40.9 (*) 81.1 - 91.4 %   MCV 88.4  78.0 - 100.0 fL   MCH 30.7  26.0 - 34.0 pg   MCHC 34.7  30.0 - 36.0 g/dL   RDW 78.2  95.6 - 21.3 %   Platelets 266  150 - 400 K/uL   Neutrophils Relative % 72  43 - 77 %   Neutro Abs 6.0  1.7 - 7.7 K/uL   Lymphocytes Relative 21  12 - 46 %   Lymphs Abs 1.8  0.7 - 4.0 K/uL   Monocytes Relative 6  3 - 12 %   Monocytes Absolute 0.5  0.1 - 1.0 K/uL   Eosinophils Relative 1  0 - 5 %   Eosinophils Absolute 0.1  0.0 - 0.7 K/uL   Basophils Relative 0  0 - 1 %   Basophils Absolute 0.0  0.0 - 0.1 K/uL  GLUCOSE, CAPILLARY      Result Value Range   Glucose-Capillary 172 (*) 70 - 99 mg/dL  COMPREHENSIVE METABOLIC PANEL      Result Value Range   Sodium 133 (*) 135 - 145 mEq/L   Potassium 4.5  3.5 - 5.1 mEq/L   Chloride 95 (*) 96 - 112 mEq/L   CO2 25  19 - 32 mEq/L   Glucose, Bld 241 (*) 70 - 99 mg/dL   BUN 25 (*) 6 - 23 mg/dL   Creatinine, Ser 0.86 (*) 0.50 - 1.10 mg/dL   Calcium 57.8  8.4 - 46.9 mg/dL   Total Protein 7.3  6.0 - 8.3 g/dL   Albumin 3.6  3.5 - 5.2 g/dL   AST 22  0 - 37 U/L   ALT 20  0 - 35 U/L   Alkaline Phosphatase 84  39 - 117 U/L   Total Bilirubin 0.4  0.3 - 1.2 mg/dL   GFR calc non Af Amer 47 (*) >90 mL/min   GFR calc Af Amer 54 (*) >90 mL/min  TROPONIN I      Result Value Range   Troponin I <0.30  <0.30 ng/mL  URINALYSIS, ROUTINE W REFLEX MICROSCOPIC      Result Value Range   Color, Urine YELLOW  YELLOW   APPearance CLEAR  CLEAR   Specific Gravity, Urine 1.021  1.005 - 1.030   pH  6.0  5.0 - 8.0   Glucose, UA NEGATIVE  NEGATIVE mg/dL   Hgb urine dipstick NEGATIVE  NEGATIVE   Bilirubin Urine NEGATIVE  NEGATIVE   Ketones, ur 15 (*) NEGATIVE mg/dL   Protein, ur NEGATIVE  NEGATIVE mg/dL   Urobilinogen, UA 0.2  0.0 - 1.0 mg/dL   Nitrite NEGATIVE  NEGATIVE   Leukocytes, UA SMALL (*) NEGATIVE  URINE MICROSCOPIC-ADD ON      Result Value Range   Squamous Epithelial / LPF RARE  RARE   WBC, UA 3-6  <3 WBC/hpf   Bacteria, UA RARE  RARE  OCCULT BLOOD, POC DEVICE      Result Value Range   Fecal Occult Bld NEGATIVE  NEGATIVE  SAMPLE TO BLOOD BANK      Result Value Range   Blood Bank Specimen SAMPLE AVAILABLE FOR TESTING     Sample Expiration  03/31/2013     Laboratory interpretation all normal except hyperglycemia, stable anemia and renal insuffic, poss uti     Imaging Review Dg Chest Portable 1 View  03/28/2013   *RADIOLOGY REPORT*  Clinical Data: Shortness of breath, chest discomfort, nonsmoker  PORTABLE CHEST - 1 VIEW  Comparison: 03/26/2013; 08/07/2009  Findings:  Grossly unchanged cardiac silhouette and mediastinal contours with atherosclerotic calcifications within the thoracic aorta.  Punctate granuloma again overlies the right mid lung, the sequela of prior granulomatous infection.  Nipple shadows overlie the bilateral lower lungs.  No focal airspace opacity.  No pleural effusion or pneumothorax.  No evidence of edema.  No acute osseous abnormalities.  IMPRESSION: No acute cardiopulmonary disease.   Original Report Authenticated By: Tacey Ruiz, MD      Date: 03/28/2013  Rate: 67  Rhythm: normal sinus rhythm  QRS Axis: normal  Intervals: normal  ST/T Wave abnormalities: normal  Conduction Disutrbances:none  Narrative Interpretation:   Old EKG Reviewed: unchanged from 03/05/2006    MDM  Pt has had nausea and diarrhea presents with near syncope. She was given IV fluids and is able to ambulate without difficulty now.    1. Weakness   2. Near syncope   3.  Nausea   4. Diarrhea   5. Dehydration   6. Atypical chest pain   7. UTI (urinary tract infection)     New Prescriptions   CEPHALEXIN (KEFLEX) 500 MG CAPSULE    Take 1 capsule (500 mg total) by mouth 3 (three) times daily.   ONDANSETRON (ZOFRAN) 4 MG TABLET    Take 1 tablet (4 mg total) by mouth every 8 (eight) hours as needed for nausea.    Plan discharge   Devoria Albe, MD, Franz Dell, MD 03/28/13 [phone]

## 2013-03-28 NOTE — ED Notes (Signed)
Pt taken peanut butter, crackers and water.

## 2013-03-28 NOTE — ED Notes (Signed)
Pt c/o of dizziness and lightheadedness that started this morning. Also c/o of nausea and diarrhea. Pt States that she feels a sudden onset of chest pain that radiates up into neck and left arm. Denies vomiting.

## 2013-05-24 ENCOUNTER — Other Ambulatory Visit: Payer: Self-pay

## 2013-05-24 DIAGNOSIS — Z1231 Encounter for screening mammogram for malignant neoplasm of breast: Secondary | ICD-10-CM

## 2013-06-27 ENCOUNTER — Ambulatory Visit: Payer: Medicare Other

## 2013-07-06 ENCOUNTER — Ambulatory Visit
Admission: RE | Admit: 2013-07-06 | Discharge: 2013-07-06 | Disposition: A | Discharge status: Home / Self Care | Payer: Medicare Other | Source: Ambulatory Visit

## 2013-07-06 DIAGNOSIS — Z1231 Encounter for screening mammogram for malignant neoplasm of breast: Secondary | ICD-10-CM

## 2013-11-01 ENCOUNTER — Ambulatory Visit (INDEPENDENT_AMBULATORY_CARE_PROVIDER_SITE_OTHER): Payer: Medicare HMO | Admitting: Gynecology

## 2013-11-01 ENCOUNTER — Encounter: Payer: Self-pay | Admitting: Gynecology

## 2013-11-01 VITALS — BP 136/80 | Ht 66.0 in | Wt 151.0 lb

## 2013-11-01 DIAGNOSIS — M8580 Other specified disorders of bone density and structure, unspecified site: Secondary | ICD-10-CM

## 2013-11-01 DIAGNOSIS — M949 Disorder of cartilage, unspecified: Secondary | ICD-10-CM

## 2013-11-01 DIAGNOSIS — Z780 Asymptomatic menopausal state: Secondary | ICD-10-CM

## 2013-11-01 DIAGNOSIS — N952 Postmenopausal atrophic vaginitis: Secondary | ICD-10-CM

## 2013-11-01 DIAGNOSIS — M899 Disorder of bone, unspecified: Secondary | ICD-10-CM

## 2013-11-01 MED ORDER — ESTRADIOL 0.1 MG/GM VA CREA: TOPICAL_CREAM | VAGINAL | Status: DC

## 2013-11-01 NOTE — Progress Notes (Signed)
[name] [name] [birth year] [id number]   History:    80 y.o.  who is a new patient to the practice was a former patient of Dr. Ubaldo Glassing. She brought some of her records with her that were reviewed. The patient in the past had history of overactive bladder, osteopenia, hypertension, type 2 diabetes and dyslipidemia. Her primary physician is Dr. Lorenda Hatchet who has been doing her blood work. It appears that she had a bone density study done in 2010 were her lowest T score was at the AP spine and -2.2. I could not find and the report any previous prepared studies. It appears her left femoral neck had a T score of -1.3 but I do not see any FRAX Study having been done. She has informed me that her PCP in September of 2014 start her on Fosamax 70 mg q. weekly. She is taking her calcium and vitamin D and is active.  Patient had a colonoscopy in 2012 she has had benign polyps removed in the past. Also she had many years ago in 1967 an abdominal hysterectomy with unilateral oophorectomy for what appears to be heavy cycles and fibroid uterus questionable cyst. She stated all was benign. Patient had a normal mammogram in 2014. Patient denies any prior history of abnormal Pap smears. Patient states all her vaccines are up-to-date provided by her PCP.  Past medical history,surgical history, family history and social history were all reviewed and documented in the EPIC chart.  Gynecologic History No LMP recorded. Patient has had a hysterectomy. Contraception: status post hysterectomy Last Pap: 2012. Results were: normal Last mammogram: 2014. Results were: normal  Obstetric History OB History  Gravida Para Term Preterm AB SAB TAB Ectopic Multiple Living  5 5        5     # Outcome Date GA Lbr Len/2nd Weight Sex Delivery Anes PTL Lv  5 PAR           4 PAR           3 PAR           2 PAR           1 PAR                ROS: A ROS was performed and pertinent positives and negatives are included in the  history.  GENERAL: No fevers or chills. HEENT: No change in vision, no earache, sore throat or sinus congestion. NECK: No pain or stiffness. CARDIOVASCULAR: No chest pain or pressure. No palpitations. PULMONARY: No shortness of breath, cough or wheeze. GASTROINTESTINAL: No abdominal pain, nausea, vomiting or diarrhea, melena or bright red blood per rectum. GENITOURINARY: No urinary frequency, urgency, hesitancy or dysuria. MUSCULOSKELETAL: No joint or muscle pain, no back pain, no recent trauma. DERMATOLOGIC: No rash, no itching, no lesions. ENDOCRINE: No polyuria, polydipsia, no heat or cold intolerance. No recent change in weight. HEMATOLOGICAL: No anemia or easy bruising or bleeding. NEUROLOGIC: No headache, seizures, numbness, tingling or weakness. PSYCHIATRIC: No depression, no loss of interest in normal activity or change in sleep pattern.     Exam: chaperone present  BP 136/80  Ht 5\' 6"  (1.676 m)  Wt 151 lb (68.493 kg)  BMI 24.38 kg/m2  Body mass index is 24.38 kg/(m^2).  General appearance : Well developed well nourished female. No acute distress HEENT: Neck supple, trachea midline, no carotid bruits, no thyroidmegaly Lungs: Clear to auscultation, no rhonchi or wheezes, or rib retractions  Heart: Regular rate  and rhythm, no murmurs or gallops Breast:Examined in sitting and supine position were symmetrical in appearance, no palpable masses or tenderness,  no skin retraction, no nipple inversion, no nipple discharge, no skin discoloration, no axillary or supraclavicular lymphadenopathy Abdomen: no palpable masses or tenderness, no rebound or guarding Extremities: no edema or skin discoloration or tenderness  Pelvic:  Bartholin, Urethra, Skene Glands: Within normal limits             Vagina: No gross lesions or discharge, vaginal atrophy  Cervix: Absent  Uterus absent  Adnexa  Without masses or tenderness  Anus and perineum  normal   Rectovaginal  normal sphincter tone without  palpated masses or tenderness             Hemoccult PCP provides     Assessment/Plan:  80 y.o. female with history of osteopenia was started on Fosamax by her PCP September 2014 in an effort to monitor response to treatment we are going to schedule for her to have her bone density study here in our office at the end of September a year after initiation of treatment. We'll forward a copy of the bone density study to her PCP. She was reminded to continue her calcium vitamin D and regular exercise. Pap smear was not done today into quarters to new guidelines. She was reminded to schedule her mammogram later this year. She will not need a colonoscopy for 2 more years. PCP will be drawn her blood work. She was given prescription refill for estrogen vaginal cream which she applied twice a week which is help her vaginal atrophy.  Note: This dictation was prepared with  Dragon/digital dictation along withSmart phrase technology. Any transcriptional errors that result from this process are unintentional.   Terrance Mass MD, 9:39 AM 11/01/2013

## 2013-11-01 NOTE — Patient Instructions (Signed)
Bone Densitometry Bone densitometry is a special X-ray that measures your bone density and can be used to help predict your risk of bone fractures. This test is used to determine bone mineral content and density to diagnose osteoporosis. Osteoporosis is the loss of bone that may cause the bone to become weak. Osteoporosis commonly occurs in women entering menopause. However, it may be found in men and in people with other diseases. PREPARATION FOR TEST No preparation necessary. WHO SHOULD BE TESTED?  All women older than 65.  Postmenopausal women (50 to 65) with risk factors for osteoporosis.  People with a previous fracture caused by normal activities.  People with a small body frame (less than 127 poundsor a body mass index [BMI] of less than 21).  People who have a parent with a hip fracture or history of osteoporosis.  People who smoke.  People who have rheumatoid arthritis.  Anyone who engages in excessive alcohol use (more than 3 drinks most days).  Women who experience early menopause. WHEN SHOULD YOU BE RETESTED? Current guidelines suggest that you should wait at least 2 years before doing a bone density test again if your first test was normal.Recent studies indicated that women with normal bone density may be able to wait a few years before needing to repeat a bone density test. You should discuss this with your caregiver.  NORMAL FINDINGS   Normal: less than standard deviation below normal (greater than -1).  Osteopenia: 1 to 2.5 standard deviations below normal (-1 to -2.5).  Osteoporosis: greater than 2.5 standard deviations below normal (less than -2.5). Test results are reported as a "T score" and a "Z score."The T score is a number that compares your bone density with the bone density of healthy, young women.The Z score is a number that compares your bone density with the scores of women who are the same age, gender, and race.  Ranges for normal findings may vary  among different laboratories and hospitals. You should always check with your doctor after having lab work or other tests done to discuss the meaning of your test results and whether your values are considered within normal limits. MEANING OF TEST  Your caregiver will go over the test results with you and discuss the importance and meaning of your results, as well as treatment options and the need for additional tests if necessary. OBTAINING THE TEST RESULTS It is your responsibility to obtain your test results. Ask the lab or department performing the test when and how you will get your results. Document Released: 07/28/2004 Document Revised: 09/28/2011 Document Reviewed: 08/20/2010 [employer] Patient Information 2014 [employer].  

## 2013-11-03 ENCOUNTER — Emergency Department (HOSPITAL_COMMUNITY): Payer: Medicare HMO

## 2013-11-03 ENCOUNTER — Encounter (HOSPITAL_COMMUNITY): Payer: Self-pay | Admitting: Emergency Medicine

## 2013-11-03 ENCOUNTER — Emergency Department (HOSPITAL_COMMUNITY)
Admission: EM | Admit: 2013-11-03 | Discharge: 2013-11-03 | Disposition: A | Discharge status: Home / Self Care | Payer: Medicare HMO | Attending: Emergency Medicine | Admitting: Emergency Medicine

## 2013-11-03 DIAGNOSIS — S46909A Unspecified injury of unspecified muscle, fascia and tendon at shoulder and upper arm level, unspecified arm, initial encounter: Secondary | ICD-10-CM | POA: Insufficient documentation

## 2013-11-03 DIAGNOSIS — Z23 Encounter for immunization: Secondary | ICD-10-CM | POA: Insufficient documentation

## 2013-11-03 DIAGNOSIS — Z792 Long term (current) use of antibiotics: Secondary | ICD-10-CM | POA: Insufficient documentation

## 2013-11-03 DIAGNOSIS — IMO0002 Reserved for concepts with insufficient information to code with codable children: Secondary | ICD-10-CM | POA: Insufficient documentation

## 2013-11-03 DIAGNOSIS — Z794 Long term (current) use of insulin: Secondary | ICD-10-CM | POA: Insufficient documentation

## 2013-11-03 DIAGNOSIS — W19XXXA Unspecified fall, initial encounter: Secondary | ICD-10-CM

## 2013-11-03 DIAGNOSIS — E119 Type 2 diabetes mellitus without complications: Secondary | ICD-10-CM | POA: Insufficient documentation

## 2013-11-03 DIAGNOSIS — S1093XA Contusion of unspecified part of neck, initial encounter: Secondary | ICD-10-CM

## 2013-11-03 DIAGNOSIS — E079 Disorder of thyroid, unspecified: Secondary | ICD-10-CM | POA: Insufficient documentation

## 2013-11-03 DIAGNOSIS — S0003XA Contusion of scalp, initial encounter: Secondary | ICD-10-CM | POA: Insufficient documentation

## 2013-11-03 DIAGNOSIS — S4980XA Other specified injuries of shoulder and upper arm, unspecified arm, initial encounter: Secondary | ICD-10-CM | POA: Insufficient documentation

## 2013-11-03 DIAGNOSIS — Z87448 Personal history of other diseases of urinary system: Secondary | ICD-10-CM | POA: Insufficient documentation

## 2013-11-03 DIAGNOSIS — Y92009 Unspecified place in unspecified non-institutional (private) residence as the place of occurrence of the external cause: Secondary | ICD-10-CM | POA: Insufficient documentation

## 2013-11-03 DIAGNOSIS — S022XXA Fracture of nasal bones, initial encounter for closed fracture: Secondary | ICD-10-CM | POA: Insufficient documentation

## 2013-11-03 DIAGNOSIS — Y939 Activity, unspecified: Secondary | ICD-10-CM | POA: Insufficient documentation

## 2013-11-03 DIAGNOSIS — I10 Essential (primary) hypertension: Secondary | ICD-10-CM | POA: Insufficient documentation

## 2013-11-03 DIAGNOSIS — Z79899 Other long term (current) drug therapy: Secondary | ICD-10-CM | POA: Insufficient documentation

## 2013-11-03 DIAGNOSIS — Z8709 Personal history of other diseases of the respiratory system: Secondary | ICD-10-CM | POA: Insufficient documentation

## 2013-11-03 DIAGNOSIS — W010XXA Fall on same level from slipping, tripping and stumbling without subsequent striking against object, initial encounter: Secondary | ICD-10-CM | POA: Insufficient documentation

## 2013-11-03 DIAGNOSIS — S0083XA Contusion of other part of head, initial encounter: Secondary | ICD-10-CM | POA: Insufficient documentation

## 2013-11-03 DIAGNOSIS — E785 Hyperlipidemia, unspecified: Secondary | ICD-10-CM | POA: Insufficient documentation

## 2013-11-03 DIAGNOSIS — Z7982 Long term (current) use of aspirin: Secondary | ICD-10-CM | POA: Insufficient documentation

## 2013-11-03 LAB — CBG MONITORING, ED
Glucose-Capillary: 308 mg/dL — ABNORMAL HIGH (ref 70–99)
Glucose-Capillary: 287 mg/dL — ABNORMAL HIGH (ref 70–99)

## 2013-11-03 MED ORDER — TETANUS-DIPHTH-ACELL PERTUSSIS 5-2.5-18.5 LF-MCG/0.5 IM SUSP
0.5000 mL | Freq: Once | INTRAMUSCULAR | Status: AC
  Administered 2013-11-03: 0.5 mL via INTRAMUSCULAR
  Filled 2013-11-03: qty 0.5

## 2013-11-03 MED ORDER — HYDROCODONE-ACETAMINOPHEN 5-325 MG PO TABS: 0.5000 | ORAL_TABLET | Freq: Four times a day (QID) | ORAL | Status: DC | PRN

## 2013-11-03 MED ORDER — HYDROCODONE-ACETAMINOPHEN 5-325 MG PO TABS
1.0000 | ORAL_TABLET | Freq: Once | ORAL | Status: AC
  Administered 2013-11-03: 1 via ORAL
  Filled 2013-11-03: qty 1

## 2013-11-03 NOTE — Discharge Instructions (Signed)
Facial Fracture A facial fracture is a break in one of the bones of your face. HOME CARE INSTRUCTIONS   Protect the injured part of your face until it is healed.  Do not participate in activities which give chance for re-injury until your doctor approves.  Gently wash and dry your face.  Wear head and facial protection while riding a bicycle, motorcycle, or snowmobile. SEEK MEDICAL CARE IF:   An oral temperature above 102 F (38.9 C) develops.  You have severe headaches or notice changes in your vision.  You have new numbness or tingling in your face.  You develop nausea (feeling sick to your stomach), vomiting or a stiff neck. SEEK IMMEDIATE MEDICAL CARE IF:   You develop difficulty seeing or experience double vision.  You become dizzy, lightheaded, or faint.  You develop trouble speaking, breathing, or swallowing.  You have a watery discharge from your nose or ear. MAKE SURE YOU:   Understand these instructions.  Will watch your condition.  Will get help right away if you are not doing well or get worse. Document Released: 07/06/2005 Document Revised: 09/28/2011 Document Reviewed: 02/23/2008 [facility] Patient Information 2014 [place].  Fall Prevention and Home Safety Falls cause injuries and can affect all age groups. It is possible to prevent falls.  HOW TO PREVENT FALLS  Wear shoes with rubber soles that do not have an opening for your toes.  Keep the inside and outside of your house well lit.  Use night lights throughout your home.  Remove clutter from floors.  Clean up floor spills.  Remove throw rugs or fasten them to the floor with carpet tape.  Do not place electrical cords across pathways.  Put grab bars by your tub, shower, and toilet. Do not use towel bars as grab bars.  Put handrails on both sides of the stairway. Fix loose handrails.  Do not climb on stools or stepladders, if possible.  Do not wax your floors.  Repair uneven or  unsafe sidewalks, walkways, or stairs.  Keep items you use a lot within reach.  Be aware of pets.  Keep emergency numbers next to the telephone.  Put smoke detectors in your home and near bedrooms. Ask your doctor what other things you can do to prevent falls. Document Released: 05/02/2009 Document Revised: 01/05/2012 Document Reviewed: 10/06/2011 [facility] Patient Information 2014 [place], [place]. Shoulder Pain The shoulder is the joint that connects your arms to your body. The bones that form the shoulder joint include the upper arm bone (humerus), the shoulder blade (scapula), and the collarbone (clavicle). The top of the humerus is shaped like a ball and fits into a rather flat socket on the scapula (glenoid cavity). A combination of muscles and strong, fibrous tissues that connect muscles to bones (tendons) support your shoulder joint and hold the ball in the socket. Small, fluid-filled sacs (bursae) are located in different areas of the joint. They act as cushions between the bones and the overlying soft tissues and help reduce friction between the gliding tendons and the bone as you move your arm. Your shoulder joint allows a wide range of motion in your arm. This range of motion allows you to do things like scratch your back or throw a ball. However, this range of motion also makes your shoulder more prone to pain from overuse and injury. Causes of shoulder pain can originate from both injury and overuse and usually can be grouped in the following four categories:  Redness, swelling, and pain (  inflammation) of the tendon (tendinitis) or the bursae (bursitis).  Instability, such as a dislocation of the joint.  Inflammation of the joint (arthritis).  Broken bone (fracture). HOME CARE INSTRUCTIONS   Apply ice to the sore area.  Put ice in a plastic bag.  Place a towel between your skin and the bag.  Leave the ice on for 15-20 minutes, 03-04 times per day for the first 2  days.  Stop using cold packs if they do not help with the pain.  If you have a shoulder sling or immobilizer, wear it as long as your caregiver instructs. Only remove it to shower or bathe. Move your arm as little as possible, but keep your hand moving to prevent swelling.  Squeeze a soft ball or foam pad as much as possible to help prevent swelling.  Only take over-the-counter or prescription medicines for pain, discomfort, or fever as directed by your caregiver. SEEK MEDICAL CARE IF:   Your shoulder pain increases, or new pain develops in your arm, hand, or fingers.  Your hand or fingers become cold and numb.  Your pain is not relieved with medicines. SEEK IMMEDIATE MEDICAL CARE IF:   Your arm, hand, or fingers are numb or tingling.  Your arm, hand, or fingers are significantly swollen or turn white or blue. MAKE SURE YOU:   Understand these instructions.  Will watch your condition.  Will get help right away if you are not doing well or get worse. Document Released: 04/15/2005 Document Revised: 03/30/2012 Document Reviewed: 06/20/2011 [facility] Patient Information 2014 [place].

## 2013-11-03 NOTE — ED Provider Notes (Addendum)
Medical screening examination/treatment/procedure(s) were conducted as a shared visit with non-physician practitioner(s) and myself.  I personally evaluated the patient during the encounter.   EKG Interpretation None      Patient seen by me. Patient tripped over a garden hose no syncope or loss of consciousness. [place] forward hitting the steps on the house with her face. Also had bilateral shoulder numbness and discomfort unable to get back on her feet. Had her glasses on resulted in lacerations to the bridge of her nose and around her right eye. Layer hemorrhage to the right eye no evidence of any anterior chamber hyphema. Patient's tetanus not up-to-date will require one today. Patient needs a CT of head neck and face. As well as x-rays the shoulder. A require suture repair of the bridge of the nose. Patient currently alert nontoxic no acute distress. Bleeding controlled.  Mervin Kung, MD 11/03/13 1550  Results for orders placed during the hospital encounter of 11/03/13  CBG MONITORING, ED      Result Value Ref Range   Glucose-Capillary 308 (*) 70 - 99 mg/dL   Dg Shoulder Right  11/03/2013   CLINICAL DATA:  Fall.  Right shoulder pain.  EXAM: RIGHT SHOULDER - 2+ VIEW  COMPARISON:  None.  FINDINGS: Calcific tendinopathy of the right rotator cuff. No fracture or dislocation observed. Minimal spurring of the right humeral head.  IMPRESSION: 1. Calcific tendinopathy of the right rotator cuff. No acute findings.   Electronically Signed   By: Sherryl Barters M.D.   On: 11/03/2013 16:36   Ct Head Wo Contrast  11/03/2013   CLINICAL DATA:  Fall with facial abrasions.  EXAM: CT HEAD WITHOUT CONTRAST  CT MAXILLOFACIAL WITHOUT CONTRAST  CT CERVICAL SPINE WITHOUT CONTRAST  TECHNIQUE: Multidetector CT imaging of the head, cervical spine, and maxillofacial structures were performed using the standard protocol without intravenous contrast. Multiplanar CT image reconstructions of the cervical spine and  maxillofacial structures were also generated.  COMPARISON:  MR C SPINE W/O CM dated 12/05/2011; MR C SPINE W/O CM dated 06/16/2004; report from sinus CT of 12/12/2001  FINDINGS: FINDINGS CT HEAD FINDINGS  The brainstem, cerebellum, cerebral peduncle is, thalami, basal ganglia, ventricular system, and basilar cisterns appear unremarkable. Periventricular white matter hypodensities extend down into the anterior limbs of the internal capsules.  Right facial and central forehead subcutaneous hematoma noted. Laceration along the right side of the nose. Minimal chronic right maxillary sinusitis.  There is atherosclerotic calcification of the cavernous carotid arteries bilaterally.  CT MAXILLOFACIAL FINDINGS  Subcutaneous hematoma along the forehead. There is laceration along the right side of nose with gas tracking along the bony margin, with a subtle suspected underlying nondisplaced right nasal bone fracture.  Mild chronic right maxillary sinusitis. Right middle turbinate concha bullosa.  There is right periorbital soft tissue swelling extending along the right facial tissues. None of this extends to the postseptal region; orbital contents appear intact. No orbital fracture is observed. No additional facial fracture.  CT CERVICAL SPINE FINDINGS  Pannus formation posterior to the odontoid noted.  There is considerable cervical spondylosis with uncinate and facet spurring contributing to osseous foraminal narrowing on the left at C3-4, C4-5, C5-6, C6-7, and C7-T1; and on the right at C3-4, C5-6, C6-7, and C7-T1. There is loss of disc height at C3-4, C6-7, and C7-T1 with degenerative endplate sclerosis at O5-3. Posterior osseous ridging most notable at C3-4, C5-6, and C6-7.  No significant cervical spine subluxation. No prevertebral soft tissue swelling. No fracture  involving the cervical spine is identified.  Biapical pleural parenchymal scarring noted.  IMPRESSION: 1. Right facial and central forehead scalp subcutaneous  hematoma. Laceration along the right side of the nose with suspected nondisplaced right nasal bone fracture. 2. Periventricular white matter and corona radiata hypodensities favor chronic ischemic microvascular white matter disease. No acute intracranial findings. 3. Cervical spondylosis and degenerative disc disease resulting in multilevel impingement, but without acute cervical spine fracture or subluxation identified.   Electronically Signed   By: Sherryl Barters M.D.   On: 11/03/2013 17:23   Ct Cervical Spine Wo Contrast  11/03/2013   CLINICAL DATA:  Fall with facial abrasions.  EXAM: CT HEAD WITHOUT CONTRAST  CT MAXILLOFACIAL WITHOUT CONTRAST  CT CERVICAL SPINE WITHOUT CONTRAST  TECHNIQUE: Multidetector CT imaging of the head, cervical spine, and maxillofacial structures were performed using the standard protocol without intravenous contrast. Multiplanar CT image reconstructions of the cervical spine and maxillofacial structures were also generated.  COMPARISON:  MR C SPINE W/O CM dated 12/05/2011; MR C SPINE W/O CM dated 06/16/2004; report from sinus CT of 12/12/2001  FINDINGS: FINDINGS CT HEAD FINDINGS  The brainstem, cerebellum, cerebral peduncle is, thalami, basal ganglia, ventricular system, and basilar cisterns appear unremarkable. Periventricular white matter hypodensities extend down into the anterior limbs of the internal capsules.  Right facial and central forehead subcutaneous hematoma noted. Laceration along the right side of the nose. Minimal chronic right maxillary sinusitis.  There is atherosclerotic calcification of the cavernous carotid arteries bilaterally.  CT MAXILLOFACIAL FINDINGS  Subcutaneous hematoma along the forehead. There is laceration along the right side of nose with gas tracking along the bony margin, with a subtle suspected underlying nondisplaced right nasal bone fracture.  Mild chronic right maxillary sinusitis. Right middle turbinate concha bullosa.  There is right  periorbital soft tissue swelling extending along the right facial tissues. None of this extends to the postseptal region; orbital contents appear intact. No orbital fracture is observed. No additional facial fracture.  CT CERVICAL SPINE FINDINGS  Pannus formation posterior to the odontoid noted.  There is considerable cervical spondylosis with uncinate and facet spurring contributing to osseous foraminal narrowing on the left at C3-4, C4-5, C5-6, C6-7, and C7-T1; and on the right at C3-4, C5-6, C6-7, and C7-T1. There is loss of disc height at C3-4, C6-7, and C7-T1 with degenerative endplate sclerosis at C3-7. Posterior osseous ridging most notable at C3-4, C5-6, and C6-7.  No significant cervical spine subluxation. No prevertebral soft tissue swelling. No fracture involving the cervical spine is identified.  Biapical pleural parenchymal scarring noted.  IMPRESSION: 1. Right facial and central forehead scalp subcutaneous hematoma. Laceration along the right side of the nose with suspected nondisplaced right nasal bone fracture. 2. Periventricular white matter and corona radiata hypodensities favor chronic ischemic microvascular white matter disease. No acute intracranial findings. 3. Cervical spondylosis and degenerative disc disease resulting in multilevel impingement, but without acute cervical spine fracture or subluxation identified.   Electronically Signed   By: Sherryl Barters M.D.   On: 11/03/2013 17:23   Dg Shoulder Left  11/03/2013   CLINICAL DATA:  Fall.  Left shoulder pain.  EXAM: LEFT SHOULDER - 2+ VIEW  COMPARISON:  None.  FINDINGS: Internal rotation and transscapular views of the left shoulder are provided. The patient was unable to perform the axillary view.  Irregular calcifications in the expected vicinity of the rotator cuff. There is spurring of the humeral head. No definite fracture. No dislocation observed. AC joint alignment  normal.  Aortic atherosclerotic calcification.  Bony  demineralization.  IMPRESSION: 1. Calcific tendinopathy of the left rotator cuff. No shoulder fracture or dislocation observed.   Electronically Signed   By: Sherryl Barters M.D.   On: 11/03/2013 16:30   Ct Maxillofacial Wo Cm  11/03/2013   CLINICAL DATA:  Fall with facial abrasions.  EXAM: CT HEAD WITHOUT CONTRAST  CT MAXILLOFACIAL WITHOUT CONTRAST  CT CERVICAL SPINE WITHOUT CONTRAST  TECHNIQUE: Multidetector CT imaging of the head, cervical spine, and maxillofacial structures were performed using the standard protocol without intravenous contrast. Multiplanar CT image reconstructions of the cervical spine and maxillofacial structures were also generated.  COMPARISON:  MR C SPINE W/O CM dated 12/05/2011; MR C SPINE W/O CM dated 06/16/2004; report from sinus CT of 12/12/2001  FINDINGS: FINDINGS CT HEAD FINDINGS  The brainstem, cerebellum, cerebral peduncle is, thalami, basal ganglia, ventricular system, and basilar cisterns appear unremarkable. Periventricular white matter hypodensities extend down into the anterior limbs of the internal capsules.  Right facial and central forehead subcutaneous hematoma noted. Laceration along the right side of the nose. Minimal chronic right maxillary sinusitis.  There is atherosclerotic calcification of the cavernous carotid arteries bilaterally.  CT MAXILLOFACIAL FINDINGS  Subcutaneous hematoma along the forehead. There is laceration along the right side of nose with gas tracking along the bony margin, with a subtle suspected underlying nondisplaced right nasal bone fracture.  Mild chronic right maxillary sinusitis. Right middle turbinate concha bullosa.  There is right periorbital soft tissue swelling extending along the right facial tissues. None of this extends to the postseptal region; orbital contents appear intact. No orbital fracture is observed. No additional facial fracture.  CT CERVICAL SPINE FINDINGS  Pannus formation posterior to the odontoid noted.  There is  considerable cervical spondylosis with uncinate and facet spurring contributing to osseous foraminal narrowing on the left at C3-4, C4-5, C5-6, C6-7, and C7-T1; and on the right at C3-4, C5-6, C6-7, and C7-T1. There is loss of disc height at C3-4, C6-7, and C7-T1 with degenerative endplate sclerosis at M2-5. Posterior osseous ridging most notable at C3-4, C5-6, and C6-7.  No significant cervical spine subluxation. No prevertebral soft tissue swelling. No fracture involving the cervical spine is identified.  Biapical pleural parenchymal scarring noted.  IMPRESSION: 1. Right facial and central forehead scalp subcutaneous hematoma. Laceration along the right side of the nose with suspected nondisplaced right nasal bone fracture. 2. Periventricular white matter and corona radiata hypodensities favor chronic ischemic microvascular white matter disease. No acute intracranial findings. 3. Cervical spondylosis and degenerative disc disease resulting in multilevel impingement, but without acute cervical spine fracture or subluxation identified.   Electronically Signed   By: Sherryl Barters M.D.   On: 11/03/2013 17:23    X-rays and CTs without any acute injuries. Other than nasal bone fracture. Ear nose and throat followup will be appropriate. Wound care to be done by the PA provider.  Mervin Kung, MD 11/03/13 [phone]

## 2013-11-03 NOTE — ED Notes (Signed)
Per [facility], pt's neighbor saw her lying in flower garden after tripping over hose and hitting the house and falling into the flower garden. Pt c/o bilateral shoulder pain, has abrasions to her face and laceration to bridge of her nose.

## 2013-11-03 NOTE — ED Provider Notes (Signed)
CSN: [id number]     Arrival date & time 11/03/13  1446 History   First MD Initiated Contact with Patient 11/03/13 1510     Chief Complaint  Patient presents with  . Fall  . Abrasion     (Consider location/radiation/quality/duration/timing/severity/associated sxs/prior Treatment) HPI Comments: Patient presents to the ED with a chief complaint of fall.  Patient states that she tripped over a piece of irrigation pipe in her yard this afternoon.  She then fell to the ground, catching herself with her arms, but also hitting her face on a step.  She complains of moderate pain in bilateral shoulders.  She denies any associated syncopal episode.  There was no LOC.  Patient takes an aspirin daily.  Last tetanus is unknown.  She states that the pain in her shoulders is worsened with movement.  It is improved with rest.  The pain radiates from her shoulders to her elbows.  The history is provided by the patient. No language interpreter was used.    Past Medical History  Diagnosis Date  . Diabetes mellitus   . Hypertension   . Hyperlipidemia   . Sinusitis   . Thyroid disease   . OAB (overactive bladder)    Past Surgical History  Procedure Laterality Date  . Abdominal hysterectomy    . Knee arthroscopy     Family History  Problem Relation Age of Onset  . Diabetes Mother   . Hypertension Mother   . Diabetes Sister   . Diabetes Brother    History  Substance Use Topics  . Smoking status: Never Smoker   . Smokeless tobacco: Never Used  . Alcohol Use: No   OB History   Grav Para Term Preterm Abortions TAB SAB Ect Mult Living   5 5        5      Review of Systems  Constitutional: Negative for fever and chills.  Respiratory: Negative for shortness of breath.   Cardiovascular: Negative for chest pain.  Gastrointestinal: Negative for nausea, vomiting, diarrhea and constipation.  Genitourinary: Negative for dysuria.  Musculoskeletal: Positive for arthralgias and myalgias.  Skin:  Positive for wound.      Allergies  Sulfonamide derivatives  Home Medications   Prior to Admission medications   Medication Sig Start Date End Date Taking? Authorizing Provider  omeprazole (PRILOSEC) 20 MG capsule Take 20 mg by mouth daily.   Yes Historical Provider, MD  traMADol (ULTRAM) 50 MG tablet Take 1 tablet (50 mg total) by mouth every 6 (six) hours as needed for pain. 02/24/13  Yes Billy Fischer, MD  alendronate (FOSAMAX) 70 MG tablet Take 70 mg by mouth every 7 (seven) days.  03/11/13   Historical Provider, MD  aspirin EC 81 MG tablet Take 81 mg by mouth daily.    Historical Provider, MD  cephALEXin (KEFLEX) 500 MG capsule Take 1 capsule (500 mg total) by mouth 3 (three) times daily. 03/28/13   Janice Norrie, MD  cholecalciferol (VITAMIN D) 1000 UNITS tablet Take 1,000 Units by mouth daily.    Historical Provider, MD  cyclobenzaprine (FLEXERIL) 5 MG tablet Take 5 mg by mouth 3 (three) times daily as needed for muscle spasms.    Historical Provider, MD  estradiol (ESTRACE VAGINAL) 0.1 MG/GM vaginal cream Apply vaginal twice a week 11/01/13   Terrance Mass, MD  insulin aspart (NOVOLOG) 100 UNIT/ML injection Inject 6-10 Units into the skin 3 (three) times daily before meals.     Historical Provider, MD  insulin glargine (LANTUS) 100 UNIT/ML injection Inject 8 Units into the skin 2 (two) times daily.    Historical Provider, MD  iron polysaccharides (NIFEREX) 150 MG capsule Take 150 mg by mouth daily.    Historical Provider, MD  levothyroxine (SYNTHROID, LEVOTHROID) 75 MCG tablet Take 75 mcg by mouth daily.    Historical Provider, MD  lisinopril (PRINIVIL,ZESTRIL) 10 MG tablet Take 10 mg by mouth daily.    Historical Provider, MD  loratadine (CLARITIN) 10 MG tablet Take 10 mg by mouth daily.    Historical Provider, MD  metFORMIN (GLUCOPHAGE) 1000 MG tablet Take 1,000 mg by mouth 2 (two) times daily with a meal.    Historical Provider, MD  mometasone (NASONEX) 50 MCG/ACT nasal spray Place 2  sprays into the nose daily.    Historical Provider, MD  Omega-3 Fatty Acids (FISH OIL) 1000 MG CAPS Take by mouth.    Historical Provider, MD  ondansetron (ZOFRAN) 4 MG tablet Take 1 tablet (4 mg total) by mouth every 8 (eight) hours as needed for nausea. 03/28/13   Ward Givens, MD  simvastatin (ZOCOR) 40 MG tablet Take 40 mg by mouth at bedtime.     Historical Provider, MD   BP 128/48  Temp(Src) 97.9 F (36.6 C) (Oral)  Resp 16  Ht 5\' 6"  (1.676 m)  Wt 150 lb (68.04 kg)  BMI 24.22 kg/m2  SpO2 100% Physical Exam  Nursing note and vitals reviewed. Constitutional: She is oriented to person, place, and time. She appears well-developed and well-nourished.  HENT:  Head: Normocephalic and atraumatic.  Ecchymosis surrounding right eye, abrasion to bridge of nose, no foreign body is seen, bleeding is controlled  Eyes: Conjunctivae and EOM are normal. Pupils are equal, round, and reactive to light.  Neck: Normal range of motion. Neck supple.  Cardiovascular: Normal rate and regular rhythm.  Exam reveals no gallop and no friction rub.   No murmur heard. Pulmonary/Chest: Effort normal and breath sounds normal. No respiratory distress. She has no wheezes. She has no rales. She exhibits no tenderness.  Abdominal: Soft. She exhibits no distension and no mass. There is no tenderness. There is no rebound and no guarding.  Musculoskeletal: Normal range of motion. She exhibits no edema and no tenderness.  Bilateral shoulders ROM and strength deferred secondary to pain, moderately tender to palpation, but no bony abnormality or deformity, 5/5 ROM and strength of bilateral wrists and hands  Neurological: She is alert and oriented to person, place, and time.  Skin: Skin is warm and dry.  Psychiatric: She has a normal mood and affect. Her behavior is normal. Judgment and thought content normal.    ED Course  Procedures (including critical care time) Results for orders placed during the hospital encounter of  11/03/13  CBG MONITORING, ED      Result Value Ref Range   Glucose-Capillary 308 (*) 70 - 99 mg/dL  CBG MONITORING, ED      Result Value Ref Range   Glucose-Capillary 287 (*) 70 - 99 mg/dL   Dg Shoulder Right  0/48/8891   CLINICAL DATA:  Fall.  Right shoulder pain.  EXAM: RIGHT SHOULDER - 2+ VIEW  COMPARISON:  None.  FINDINGS: Calcific tendinopathy of the right rotator cuff. No fracture or dislocation observed. Minimal spurring of the right humeral head.  IMPRESSION: 1. Calcific tendinopathy of the right rotator cuff. No acute findings.   Electronically Signed   By: Herbie Baltimore M.D.   On: 11/03/2013 16:36  Ct Head Wo Contrast  11/03/2013   CLINICAL DATA:  Fall with facial abrasions.  EXAM: CT HEAD WITHOUT CONTRAST  CT MAXILLOFACIAL WITHOUT CONTRAST  CT CERVICAL SPINE WITHOUT CONTRAST  TECHNIQUE: Multidetector CT imaging of the head, cervical spine, and maxillofacial structures were performed using the standard protocol without intravenous contrast. Multiplanar CT image reconstructions of the cervical spine and maxillofacial structures were also generated.  COMPARISON:  MR C SPINE W/O CM dated 12/05/2011; MR C SPINE W/O CM dated 06/16/2004; report from sinus CT of 12/12/2001  FINDINGS: FINDINGS CT HEAD FINDINGS  The brainstem, cerebellum, cerebral peduncle is, thalami, basal ganglia, ventricular system, and basilar cisterns appear unremarkable. Periventricular white matter hypodensities extend down into the anterior limbs of the internal capsules.  Right facial and central forehead subcutaneous hematoma noted. Laceration along the right side of the nose. Minimal chronic right maxillary sinusitis.  There is atherosclerotic calcification of the cavernous carotid arteries bilaterally.  CT MAXILLOFACIAL FINDINGS  Subcutaneous hematoma along the forehead. There is laceration along the right side of nose with gas tracking along the bony margin, with a subtle suspected underlying nondisplaced right nasal  bone fracture.  Mild chronic right maxillary sinusitis. Right middle turbinate concha bullosa.  There is right periorbital soft tissue swelling extending along the right facial tissues. None of this extends to the postseptal region; orbital contents appear intact. No orbital fracture is observed. No additional facial fracture.  CT CERVICAL SPINE FINDINGS  Pannus formation posterior to the odontoid noted.  There is considerable cervical spondylosis with uncinate and facet spurring contributing to osseous foraminal narrowing on the left at C3-4, C4-5, C5-6, C6-7, and C7-T1; and on the right at C3-4, C5-6, C6-7, and C7-T1. There is loss of disc height at C3-4, C6-7, and C7-T1 with degenerative endplate sclerosis at 075-GRM. Posterior osseous ridging most notable at C3-4, C5-6, and C6-7.  No significant cervical spine subluxation. No prevertebral soft tissue swelling. No fracture involving the cervical spine is identified.  Biapical pleural parenchymal scarring noted.  IMPRESSION: 1. Right facial and central forehead scalp subcutaneous hematoma. Laceration along the right side of the nose with suspected nondisplaced right nasal bone fracture. 2. Periventricular white matter and corona radiata hypodensities favor chronic ischemic microvascular white matter disease. No acute intracranial findings. 3. Cervical spondylosis and degenerative disc disease resulting in multilevel impingement, but without acute cervical spine fracture or subluxation identified.   Electronically Signed   By: Sherryl Barters M.D.   On: 11/03/2013 17:23   Ct Cervical Spine Wo Contrast  11/03/2013   CLINICAL DATA:  Fall with facial abrasions.  EXAM: CT HEAD WITHOUT CONTRAST  CT MAXILLOFACIAL WITHOUT CONTRAST  CT CERVICAL SPINE WITHOUT CONTRAST  TECHNIQUE: Multidetector CT imaging of the head, cervical spine, and maxillofacial structures were performed using the standard protocol without intravenous contrast. Multiplanar CT image reconstructions of  the cervical spine and maxillofacial structures were also generated.  COMPARISON:  MR C SPINE W/O CM dated 12/05/2011; MR C SPINE W/O CM dated 06/16/2004; report from sinus CT of 12/12/2001  FINDINGS: FINDINGS CT HEAD FINDINGS  The brainstem, cerebellum, cerebral peduncle is, thalami, basal ganglia, ventricular system, and basilar cisterns appear unremarkable. Periventricular white matter hypodensities extend down into the anterior limbs of the internal capsules.  Right facial and central forehead subcutaneous hematoma noted. Laceration along the right side of the nose. Minimal chronic right maxillary sinusitis.  There is atherosclerotic calcification of the cavernous carotid arteries bilaterally.  CT MAXILLOFACIAL FINDINGS  Subcutaneous hematoma along the  forehead. There is laceration along the right side of nose with gas tracking along the bony margin, with a subtle suspected underlying nondisplaced right nasal bone fracture.  Mild chronic right maxillary sinusitis. Right middle turbinate concha bullosa.  There is right periorbital soft tissue swelling extending along the right facial tissues. None of this extends to the postseptal region; orbital contents appear intact. No orbital fracture is observed. No additional facial fracture.  CT CERVICAL SPINE FINDINGS  Pannus formation posterior to the odontoid noted.  There is considerable cervical spondylosis with uncinate and facet spurring contributing to osseous foraminal narrowing on the left at C3-4, C4-5, C5-6, C6-7, and C7-T1; and on the right at C3-4, C5-6, C6-7, and C7-T1. There is loss of disc height at C3-4, C6-7, and C7-T1 with degenerative endplate sclerosis at F8-1. Posterior osseous ridging most notable at C3-4, C5-6, and C6-7.  No significant cervical spine subluxation. No prevertebral soft tissue swelling. No fracture involving the cervical spine is identified.  Biapical pleural parenchymal scarring noted.  IMPRESSION: 1. Right facial and central  forehead scalp subcutaneous hematoma. Laceration along the right side of the nose with suspected nondisplaced right nasal bone fracture. 2. Periventricular white matter and corona radiata hypodensities favor chronic ischemic microvascular white matter disease. No acute intracranial findings. 3. Cervical spondylosis and degenerative disc disease resulting in multilevel impingement, but without acute cervical spine fracture or subluxation identified.   Electronically Signed   By: Sherryl Barters M.D.   On: 11/03/2013 17:23   Dg Shoulder Left  11/03/2013   CLINICAL DATA:  Fall.  Left shoulder pain.  EXAM: LEFT SHOULDER - 2+ VIEW  COMPARISON:  None.  FINDINGS: Internal rotation and transscapular views of the left shoulder are provided. The patient was unable to perform the axillary view.  Irregular calcifications in the expected vicinity of the rotator cuff. There is spurring of the humeral head. No definite fracture. No dislocation observed. AC joint alignment normal.  Aortic atherosclerotic calcification.  Bony demineralization.  IMPRESSION: 1. Calcific tendinopathy of the left rotator cuff. No shoulder fracture or dislocation observed.   Electronically Signed   By: Sherryl Barters M.D.   On: 11/03/2013 16:30   Ct Maxillofacial Wo Cm  11/03/2013   CLINICAL DATA:  Fall with facial abrasions.  EXAM: CT HEAD WITHOUT CONTRAST  CT MAXILLOFACIAL WITHOUT CONTRAST  CT CERVICAL SPINE WITHOUT CONTRAST  TECHNIQUE: Multidetector CT imaging of the head, cervical spine, and maxillofacial structures were performed using the standard protocol without intravenous contrast. Multiplanar CT image reconstructions of the cervical spine and maxillofacial structures were also generated.  COMPARISON:  MR C SPINE W/O CM dated 12/05/2011; MR C SPINE W/O CM dated 06/16/2004; report from sinus CT of 12/12/2001  FINDINGS: FINDINGS CT HEAD FINDINGS  The brainstem, cerebellum, cerebral peduncle is, thalami, basal ganglia, ventricular system,  and basilar cisterns appear unremarkable. Periventricular white matter hypodensities extend down into the anterior limbs of the internal capsules.  Right facial and central forehead subcutaneous hematoma noted. Laceration along the right side of the nose. Minimal chronic right maxillary sinusitis.  There is atherosclerotic calcification of the cavernous carotid arteries bilaterally.  CT MAXILLOFACIAL FINDINGS  Subcutaneous hematoma along the forehead. There is laceration along the right side of nose with gas tracking along the bony margin, with a subtle suspected underlying nondisplaced right nasal bone fracture.  Mild chronic right maxillary sinusitis. Right middle turbinate concha bullosa.  There is right periorbital soft tissue swelling extending along the right facial tissues. None of  this extends to the postseptal region; orbital contents appear intact. No orbital fracture is observed. No additional facial fracture.  CT CERVICAL SPINE FINDINGS  Pannus formation posterior to the odontoid noted.  There is considerable cervical spondylosis with uncinate and facet spurring contributing to osseous foraminal narrowing on the left at C3-4, C4-5, C5-6, C6-7, and C7-T1; and on the right at C3-4, C5-6, C6-7, and C7-T1. There is loss of disc height at C3-4, C6-7, and C7-T1 with degenerative endplate sclerosis at C5-8. Posterior osseous ridging most notable at C3-4, C5-6, and C6-7.  No significant cervical spine subluxation. No prevertebral soft tissue swelling. No fracture involving the cervical spine is identified.  Biapical pleural parenchymal scarring noted.  IMPRESSION: 1. Right facial and central forehead scalp subcutaneous hematoma. Laceration along the right side of the nose with suspected nondisplaced right nasal bone fracture. 2. Periventricular white matter and corona radiata hypodensities favor chronic ischemic microvascular white matter disease. No acute intracranial findings. 3. Cervical spondylosis and  degenerative disc disease resulting in multilevel impingement, but without acute cervical spine fracture or subluxation identified.   Electronically Signed   By: Sherryl Barters M.D.   On: 11/03/2013 17:23     Imaging Review No results found.   EKG Interpretation None      MDM   Final diagnoses:  Fall  Nasal bone fracture    Patient with mechanical fall.  Patient seen by and discussed with Dr. Rogene Houston.  Will order imaging, up date tetanus, treat pain, and re-evaluate.  5:35 PM Patient seen by and discussed with Dr. Rogene Houston.  I will repair the nose lac with steri strips, as it is really more of a skin tear. There is a subtle nasal bone fracture.  No septal hematoma.  Otherwise imaging is negative for acute findings.  Will recommend ortho follow up for shoulder pain. Discharge to home with ENT/ortho follow-up.    Montine Circle, PA-C 11/03/13 [phone]

## 2014-03-20 ENCOUNTER — Other Ambulatory Visit: Payer: Self-pay | Admitting: Sports Medicine

## 2014-03-20 DIAGNOSIS — M5412 Radiculopathy, cervical region: Secondary | ICD-10-CM

## 2014-03-20 DIAGNOSIS — M5032 Other cervical disc degeneration, mid-cervical region, unspecified level: Secondary | ICD-10-CM

## 2014-03-20 DIAGNOSIS — M542 Cervicalgia: Secondary | ICD-10-CM

## 2014-03-22 ENCOUNTER — Ambulatory Visit
Admission: RE | Admit: 2014-03-22 | Discharge: 2014-03-22 | Disposition: A | Discharge status: Home / Self Care | Payer: Medicare HMO | Source: Ambulatory Visit | Attending: Sports Medicine | Admitting: Sports Medicine

## 2014-03-22 DIAGNOSIS — M5412 Radiculopathy, cervical region: Secondary | ICD-10-CM

## 2014-03-22 DIAGNOSIS — M542 Cervicalgia: Secondary | ICD-10-CM

## 2014-03-22 DIAGNOSIS — M5032 Other cervical disc degeneration, mid-cervical region, unspecified level: Secondary | ICD-10-CM

## 2014-05-21 ENCOUNTER — Encounter (HOSPITAL_COMMUNITY): Payer: Self-pay | Admitting: Emergency Medicine

## 2014-08-17 ENCOUNTER — Emergency Department (INDEPENDENT_AMBULATORY_CARE_PROVIDER_SITE_OTHER)
Admission: EM | Admit: 2014-08-17 | Discharge: 2014-08-17 | Disposition: A | Discharge status: Home / Self Care | Payer: Medicare HMO | Source: Home / Self Care | Attending: Family Medicine | Admitting: Family Medicine

## 2014-08-17 ENCOUNTER — Encounter (HOSPITAL_COMMUNITY): Payer: Self-pay

## 2014-08-17 ENCOUNTER — Emergency Department (INDEPENDENT_AMBULATORY_CARE_PROVIDER_SITE_OTHER): Payer: Medicare HMO

## 2014-08-17 DIAGNOSIS — K2100 Gastro-esophageal reflux disease with esophagitis, without bleeding: Secondary | ICD-10-CM

## 2014-08-17 MED ORDER — SUCRALFATE 1 G PO TABS: 1.0000 g | ORAL_TABLET | Freq: Three times a day (TID) | ORAL | Status: DC

## 2014-08-17 MED ORDER — GI COCKTAIL ~~LOC~~
ORAL | Status: AC
  Filled 2014-08-17: qty 30

## 2014-08-17 MED ORDER — GI COCKTAIL ~~LOC~~
30.0000 mL | Freq: Once | ORAL | Status: AC
  Administered 2014-08-17: 30 mL via ORAL

## 2014-08-17 NOTE — Discharge Instructions (Signed)
Stop fosamax, use medicine as prescribed, see your doctor in 1 week for recheck.

## 2014-08-17 NOTE — ED Notes (Signed)
C/o epigastric area pain x 1 week , minimal improvement w belching. C/o she feels bloated . NAD . No pain w direct palpation to chest wall

## 2014-08-17 NOTE — ED Provider Notes (Signed)
CSN: [id number]     Arrival date & time 08/17/14  1328 History   None    Chief Complaint  Patient presents with  . Abdominal Pain   (Consider location/radiation/quality/duration/timing/severity/associated sxs/prior Treatment) Patient is a 81 y.o. female presenting with abdominal pain. The history is provided by the patient.  Abdominal Pain Pain location:  Epigastric Pain quality: burning   Pain radiates to:  Does not radiate Pain severity:  Mild Onset quality:  Gradual Duration:  3 days Chronicity:  New Relieved by:  Nothing Associated symptoms: belching and nausea   Associated symptoms: no chills, no diarrhea, no fever, no hematochezia, no melena and no vomiting     Past Medical History  Diagnosis Date  . Diabetes mellitus   . Hypertension   . Hyperlipidemia   . Sinusitis   . Thyroid disease   . OAB (overactive bladder)    Past Surgical History  Procedure Laterality Date  . Abdominal hysterectomy    . Knee arthroscopy     Family History  Problem Relation Age of Onset  . Diabetes Mother   . Hypertension Mother   . Diabetes Sister   . Diabetes Brother    History  Substance Use Topics  . Smoking status: Never Smoker   . Smokeless tobacco: Never Used  . Alcohol Use: No   OB History    Gravida Para Term Preterm AB TAB SAB Ectopic Multiple Living   5 5        5      Review of Systems  Constitutional: Negative for fever and chills.  HENT: Negative.   Gastrointestinal: Positive for nausea and abdominal pain. Negative for vomiting, diarrhea, blood in stool, melena and hematochezia.    Allergies  Sulfonamide derivatives  Home Medications   Prior to Admission medications   Medication Sig Start Date End Date Taking? Authorizing Provider  alendronate (FOSAMAX) 70 MG tablet Take 70 mg by mouth every 7 (seven) days.  03/11/13   Historical Provider, MD  aspirin EC 81 MG tablet Take 81 mg by mouth daily.    Historical Provider, MD  cephALEXin (KEFLEX) 500 MG capsule  Take 1 capsule (500 mg total) by mouth 3 (three) times daily. 03/28/13   Janice Norrie, MD  cholecalciferol (VITAMIN D) 1000 UNITS tablet Take 1,000 Units by mouth daily.    Historical Provider, MD  cyclobenzaprine (FLEXERIL) 5 MG tablet Take 5 mg by mouth 3 (three) times daily as needed for muscle spasms.    Historical Provider, MD  estradiol (ESTRACE VAGINAL) 0.1 MG/GM vaginal cream Apply vaginal twice a week 11/01/13   Terrance Mass, MD  HYDROcodone-acetaminophen (NORCO/VICODIN) 5-325 MG per tablet Take 0.5 tablets by mouth every 6 (six) hours as needed. 11/03/13   Montine Circle, PA-C  insulin aspart (NOVOLOG) 100 UNIT/ML injection Inject 6-10 Units into the skin 3 (three) times daily before meals.     Historical Provider, MD  insulin glargine (LANTUS) 100 UNIT/ML injection Inject 8 Units into the skin 2 (two) times daily.    Historical Provider, MD  iron polysaccharides (NIFEREX) 150 MG capsule Take 150 mg by mouth daily.    Historical Provider, MD  levothyroxine (SYNTHROID, LEVOTHROID) 75 MCG tablet Take 75 mcg by mouth daily.    Historical Provider, MD  lisinopril (PRINIVIL,ZESTRIL) 10 MG tablet Take 10 mg by mouth daily.    Historical Provider, MD  loratadine (CLARITIN) 10 MG tablet Take 10 mg by mouth daily.    Historical Provider, MD  metFORMIN (GLUCOPHAGE)  1000 MG tablet Take 1,000 mg by mouth 2 (two) times daily with a meal.    Historical Provider, MD  mometasone (NASONEX) 50 MCG/ACT nasal spray Place 2 sprays into the nose daily.    Historical Provider, MD  Omega-3 Fatty Acids (FISH OIL) 1000 MG CAPS Take by mouth.    Historical Provider, MD  omeprazole (PRILOSEC) 20 MG capsule Take 20 mg by mouth daily.    Historical Provider, MD  ondansetron (ZOFRAN) 4 MG tablet Take 1 tablet (4 mg total) by mouth every 8 (eight) hours as needed for nausea. 03/28/13   Janice Norrie, MD  simvastatin (ZOCOR) 40 MG tablet Take 40 mg by mouth at bedtime.     Historical Provider, MD  sucralfate (CARAFATE) 1 G  tablet Take 1 tablet (1 g total) by mouth 4 (four) times daily -  with meals and at bedtime. 08/17/14   Billy Fischer, MD  traMADol (ULTRAM) 50 MG tablet Take 1 tablet (50 mg total) by mouth every 6 (six) hours as needed for pain. 02/24/13   Billy Fischer, MD   BP 182/69 mmHg  Pulse 70  Temp(Src) 99.4 F (37.4 C) (Oral)  Resp 16  SpO2 97% Physical Exam  Constitutional: She is oriented to person, place, and time. She appears well-developed and well-nourished.  HENT:  Mouth/Throat: Oropharynx is clear and moist.  Eyes: Pupils are equal, round, and reactive to light.  Neck: Normal range of motion. Neck supple.  Cardiovascular: Normal rate, regular rhythm, normal heart sounds and intact distal pulses.   Pulmonary/Chest: Effort normal and breath sounds normal.  Abdominal: Soft. Bowel sounds are normal. She exhibits no distension and no mass. There is no tenderness. There is no rebound and no guarding.  Neurological: She is alert and oriented to person, place, and time.  Skin: Skin is warm.  Nursing note and vitals reviewed.   ED Course  Procedures (including critical care time) Labs Review Labs Reviewed - No data to display  Imaging Review Dg Chest 2 View  08/17/2014   CLINICAL DATA:  GERD  EXAM: CHEST  2 VIEW  COMPARISON:  03/28/2013  FINDINGS: Cardiomediastinal silhouette is stable. No acute infiltrate or pleural effusion. No pulmonary edema. Thoracic spine osteopenia.  IMPRESSION: No active cardiopulmonary disease.   Electronically Signed   By: Lahoma Crocker M.D.   On: 08/17/2014 14:53   X-rays reviewed and report per radiologist.   MDM   1. Gastroesophageal reflux disease with esophagitis    Feel that pt's sx likely related to fosamax treatment.    Billy Fischer, MD 08/17/14 [phone]

## 2014-09-10 ENCOUNTER — Other Ambulatory Visit: Payer: Self-pay | Admitting: Internal Medicine

## 2014-09-10 DIAGNOSIS — Z1231 Encounter for screening mammogram for malignant neoplasm of breast: Secondary | ICD-10-CM

## 2014-09-25 ENCOUNTER — Ambulatory Visit: Payer: Medicare HMO | Attending: Internal Medicine | Admitting: Internal Medicine

## 2014-09-25 ENCOUNTER — Encounter: Payer: Self-pay | Admitting: Internal Medicine

## 2014-09-25 VITALS — BP 147/68 | HR 65 | Temp 98.0°F | Resp 15 | Wt 155.0 lb

## 2014-09-25 DIAGNOSIS — E119 Type 2 diabetes mellitus without complications: Secondary | ICD-10-CM | POA: Insufficient documentation

## 2014-09-25 DIAGNOSIS — E785 Hyperlipidemia, unspecified: Secondary | ICD-10-CM

## 2014-09-25 DIAGNOSIS — Z7982 Long term (current) use of aspirin: Secondary | ICD-10-CM | POA: Diagnosis not present

## 2014-09-25 DIAGNOSIS — K219 Gastro-esophageal reflux disease without esophagitis: Secondary | ICD-10-CM | POA: Diagnosis not present

## 2014-09-25 DIAGNOSIS — Z7951 Long term (current) use of inhaled steroids: Secondary | ICD-10-CM | POA: Diagnosis not present

## 2014-09-25 DIAGNOSIS — E039 Hypothyroidism, unspecified: Secondary | ICD-10-CM | POA: Diagnosis not present

## 2014-09-25 DIAGNOSIS — I10 Essential (primary) hypertension: Secondary | ICD-10-CM | POA: Insufficient documentation

## 2014-09-25 DIAGNOSIS — E139 Other specified diabetes mellitus without complications: Secondary | ICD-10-CM | POA: Diagnosis not present

## 2014-09-25 DIAGNOSIS — M810 Age-related osteoporosis without current pathological fracture: Secondary | ICD-10-CM | POA: Insufficient documentation

## 2014-09-25 DIAGNOSIS — E038 Other specified hypothyroidism: Secondary | ICD-10-CM

## 2014-09-25 DIAGNOSIS — Z794 Long term (current) use of insulin: Secondary | ICD-10-CM | POA: Diagnosis not present

## 2014-09-25 LAB — POCT URINALYSIS DIPSTICK
BILIRUBIN UA: NEGATIVE
Blood, UA: NEGATIVE
GLUCOSE UA: 500
Ketones, UA: NEGATIVE
Leukocytes, UA: NEGATIVE
NITRITE UA: NEGATIVE
PH UA: 6.5
Protein, UA: NEGATIVE
Spec Grav, UA: 1.01
Urobilinogen, UA: 0.2

## 2014-09-25 LAB — GLUCOSE, POCT (MANUAL RESULT ENTRY)
POC GLUCOSE: 432 mg/dL — AB (ref 70–99)
POC GLUCOSE: 188 mg/dL — AB (ref 70–99)

## 2014-09-25 LAB — POCT GLYCOSYLATED HEMOGLOBIN (HGB A1C): Hemoglobin A1C: 8.9

## 2014-09-25 MED ORDER — PANTOPRAZOLE SODIUM 40 MG PO TBEC: 40.0000 mg | DELAYED_RELEASE_TABLET | Freq: Every day | ORAL | Status: DC

## 2014-09-25 MED ORDER — INSULIN DETEMIR 100 UNIT/ML FLEXPEN: 7.0000 [IU] | PEN_INJECTOR | Freq: Two times a day (BID) | SUBCUTANEOUS | Status: DC

## 2014-09-25 MED ORDER — INSULIN ASPART 100 UNIT/ML ~~LOC~~ SOLN
20.0000 [IU] | Freq: Once | SUBCUTANEOUS | Status: AC
  Administered 2014-09-25: 20 [IU] via SUBCUTANEOUS

## 2014-09-25 MED ORDER — LEVOTHYROXINE SODIUM 100 MCG PO TABS: 100.0000 ug | ORAL_TABLET | Freq: Every day | ORAL | Status: DC

## 2014-09-25 MED ORDER — LOSARTAN POTASSIUM 25 MG PO TABS: 25.0000 mg | ORAL_TABLET | Freq: Every day | ORAL | Status: DC

## 2014-09-25 NOTE — Progress Notes (Signed)
Patient Demographics  [name] [name], is a 81 y.o. female  DJS:[id number]  YIF:[id number]  DOB - 06/12/1934  CC:  Chief Complaint  Patient presents with  . Diabetes       HPI: [name] [name] is a 81 y.o. female here today to establish medical care.patient has history of diabetes hypertension hyperlipidemia, hypothyroidism, osteopenia/osteoporosis, today her blood sugar is elevated as per patient she is already eaten today, her hemoglobin A1c is 8.9% currently she is taking, metformin, Levemir 7 units 2 times a day and NovoLog 7to 10 units with each meal 3 times a day, she's also taking levothyroxine 100 mcg daily, she denies any change in the dosage recently Patient has No headache, No chest pain, No abdominal pain - No Nausea, No new weakness tingling or numbness, No Cough - SOB.  Allergies  Allergen Reactions  . Sulfonamide Derivatives Hives   Past Medical History  Diagnosis Date  . Diabetes mellitus   . Hypertension   . Hyperlipidemia   . Sinusitis   . Thyroid disease   . OAB (overactive bladder)    Current Outpatient Prescriptions on File Prior to Visit  Medication Sig Dispense Refill  . alendronate (FOSAMAX) 70 MG tablet Take 70 mg by mouth every 7 (seven) days.     Marland Kitchen aspirin EC 81 MG tablet Take 81 mg by mouth daily.    . cholecalciferol (VITAMIN D) 1000 UNITS tablet Take 1,000 Units by mouth daily.    . cyclobenzaprine (FLEXERIL) 5 MG tablet Take 5 mg by mouth 3 (three) times daily as needed for muscle spasms.    Marland Kitchen estradiol (ESTRACE VAGINAL) 0.1 MG/GM vaginal cream Apply vaginal twice a week 42.5 g 12  . insulin aspart (NOVOLOG) 100 UNIT/ML injection Inject 6-10 Units into the skin 3 (three) times daily before meals.     . iron polysaccharides (NIFEREX) 150 MG capsule Take 150 mg by mouth daily.    Marland Kitchen levothyroxine (SYNTHROID, LEVOTHROID) 75 MCG tablet Take 100 mcg by mouth daily.    Marland Kitchen loratadine (CLARITIN) 10 MG tablet Take 10 mg by mouth daily.    . metFORMIN  (GLUCOPHAGE) 1000 MG tablet Take 1,000 mg by mouth 2 (two) times daily with a meal.    . mometasone (NASONEX) 50 MCG/ACT nasal spray Place 2 sprays into the nose daily.    . Omega-3 Fatty Acids (FISH OIL) 1000 MG CAPS Take by mouth.    . ondansetron (ZOFRAN) 4 MG tablet Take 1 tablet (4 mg total) by mouth every 8 (eight) hours as needed for nausea. 8 tablet 0  . simvastatin (ZOCOR) 40 MG tablet Take 40 mg by mouth at bedtime.     . sucralfate (CARAFATE) 1 G tablet Take 1 tablet (1 g total) by mouth 4 (four) times daily -  with meals and at bedtime. 30 tablet 2  . traMADol (ULTRAM) 50 MG tablet Take 1 tablet (50 mg total) by mouth every 6 (six) hours as needed for pain. 20 tablet 0   No current facility-administered medications on file prior to visit.   Family History  Problem Relation Age of Onset  . Diabetes Mother   . Hypertension Mother   . Diabetes Sister   . Diabetes Brother    History   Social History  . Marital Status: Widowed    Spouse Name: N/A  . Number of Children: N/A  . Years of Education: N/A   Occupational History  . Not on file.   Social History Main  Topics  . Smoking status: Never Smoker   . Smokeless tobacco: Never Used  . Alcohol Use: No  . Drug Use: No  . Sexual Activity: Not on file   Other Topics Concern  . Not on file   Social History Narrative    Review of Systems: Constitutional: Negative for fever, chills, diaphoresis, activity change, appetite change and fatigue. HENT: Negative for ear pain, nosebleeds, congestion, facial swelling, rhinorrhea, neck pain, neck stiffness and ear discharge.  Eyes: Negative for pain, discharge, redness, itching and visual disturbance. Respiratory: Negative for cough, choking, chest tightness, shortness of breath, wheezing and stridor.  Cardiovascular: Negative for chest pain, palpitations and leg swelling. Gastrointestinal: Negative for abdominal distention. Genitourinary: Negative for dysuria, urgency,  frequency, hematuria, flank pain, decreased urine volume, difficulty urinating and dyspareunia.  Musculoskeletal: Negative for back pain, joint swelling, arthralgia and gait problem. Neurological: Negative for dizziness, tremors, seizures, syncope, facial asymmetry, speech difficulty, weakness, light-headedness, numbness and headaches.  Hematological: Negative for adenopathy. Does not bruise/bleed easily. Psychiatric/Behavioral: Negative for hallucinations, behavioral problems, confusion, dysphoric mood, decreased concentration and agitation.    Objective:   Filed Vitals:   09/25/14 1556  BP: 147/68  Pulse: 65  Temp: 98 F (36.7 C)  Resp: 15    Physical Exam: Constitutional: Patient appears well-developed and well-nourished. No distress. HENT: Normocephalic, atraumatic, External right and left ear normal. Oropharynx is clear and moist.  Eyes: Conjunctivae and EOM are normal. PERRLA, no scleral icterus. Neck: Normal ROM. Neck supple. No JVD. No tracheal deviation. No thyromegaly. CVS: RRR, S1/S2 +, no murmurs, no gallops, no carotid bruit.  Pulmonary: Effort and breath sounds normal, no stridor, rhonchi, wheezes, rales.  Abdominal: Soft. BS +, no distension, tenderness, rebound or guarding.  Musculoskeletal: Normal range of motion. No edema and no tenderness.  Neuro: Alert. Normal reflexes, muscle tone coordination. No cranial nerve deficit. Skin: Skin is warm and dry. No rash noted. Not diaphoretic. No erythema. No pallor. Psychiatric: Normal mood and affect. Behavior, judgment, thought content normal.  Lab Results  Component Value Date   WBC 8.3 03/28/2013   HGB 11.9* 03/28/2013   HCT 34.3* 03/28/2013   MCV 88.4 03/28/2013   PLT 266 03/28/2013   Lab Results  Component Value Date   CREATININE 1.11* 03/28/2013   BUN 25* 03/28/2013   NA 133* 03/28/2013   K 4.5 03/28/2013   CL 95* 03/28/2013   CO2 25 03/28/2013    Lab Results  Component Value Date   HGBA1C 8.90  09/25/2014   Lipid Panel     Component Value Date/Time   CHOL 187 02/02/2008 0959   TRIG 74 02/02/2008 0959   HDL 90.6 02/02/2008 0959   CHOLHDL 2.1 CALC 02/02/2008 0959   VLDL 15 02/02/2008 0959   LDLCALC 82 02/02/2008 0959       Assessment and plan:   1. Other specified diabetes mellitus without complications Results for orders placed or performed in visit on 09/25/14  Glucose (CBG)  Result Value Ref Range   POC Glucose 432 (A) 70 - 99 mg/dl  HgB A1c  Result Value Ref Range   Hemoglobin A1C 8.90   Urinalysis Dipstick  Result Value Ref Range   Color, UA yellow    Clarity, UA clear    Glucose, UA 500    Bilirubin, UA neg    Ketones, UA neg    Spec Grav, UA 1.010    Blood, UA neg    pH, UA 6.5    Protein, UA  neg    Urobilinogen, UA 0.2    Nitrite, UA neg    Leukocytes, UA Negative   Glucose (CBG)  Result Value Ref Range   POC Glucose 188.0 (A) 70 - 99 mg/dl   Currently her hemoglobin A1c is 8.9%, need better control, she will increase the dose of Levemir to 8 units 2 times a day, continue with NovoLog sliding scale, advised for diabetes meal planning. Will repeat A1c in 3 months.  - Urinalysis Dipstick - insulin aspart (novoLOG) injection 20 Units; Inject 0.2 mLs (20 Units total) into the skin once. - Insulin Detemir (LEVEMIR FLEXTOUCH) 100 UNIT/ML Pen; Inject 7 Units into the skin 2 (two) times daily.  Dispense: 15 mL; Refill: 11 - Glucose (CBG)  2. Essential hypertension Blood pressure is borderline elevated, advise patient for DASH diet continue with current medication - losartan (COZAAR) 25 MG tablet; Take 1 tablet (25 mg total) by mouth daily.  Dispense: 30 tablet; Refill: 3 - COMPLETE METABOLIC PANEL WITH GFR  3. Hyperlipidemia Patient is currently on Zocor 40 mg daily, will check fasting lipid panel on the next visit.  4. Other specified hypothyroidism Patient is currently taking levothyroxine 100 mcg daily, check TSH level. - TSH  5.  Gastroesophageal reflux disease, esophagitis presence not specified Advised patient for low modification, continue with Protonix, she is also following up with GI. - pantoprazole (PROTONIX) 40 MG tablet; Take 1 tablet (40 mg total) by mouth daily.  Dispense: 30 tablet; Refill: 3      Health Maintenance -Colonoscopy:as per patient she is up-to-date with colonoscopy, obtain medical records. : -Mammogram:up-to-date   Return in about 3 months (around 12/26/2014) for diabetes, hypertension, hyperipidemia.   The patient was given clear instructions to go to ER or return to medical center if symptoms don't improve, worsen or new problems develop. The patient verbalized understanding. The patient was told to call to get lab results if they haven't heard anything in the next week.    This note has been created with Surveyor, quantity. Any transcriptional errors are unintentional.   Lorayne Marek, MD

## 2014-09-25 NOTE — Progress Notes (Signed)
Patient here to establish care Patient has history of diabetes  Patient does not have her medications or list with her Patient presents in office with elevated blood sugar

## 2014-09-25 NOTE — Patient Instructions (Addendum)
Diabetes Mellitus and Food It is important for you to manage your blood sugar (glucose) level. Your blood glucose level can be greatly affected by what you eat. Eating healthier foods in the appropriate amounts throughout the day at about the same time each day will help you control your blood glucose level. It can also help slow or prevent worsening of your diabetes mellitus. Healthy eating may even help you improve the level of your blood pressure and reach or maintain a healthy weight.  HOW CAN FOOD AFFECT ME? Carbohydrates Carbohydrates affect your blood glucose level more than any other type of food. Your dietitian will help you determine how many carbohydrates to eat at each meal and teach you how to count carbohydrates. Counting carbohydrates is important to keep your blood glucose at a healthy level, especially if you are using insulin or taking certain medicines for diabetes mellitus. Alcohol Alcohol can cause sudden decreases in blood glucose (hypoglycemia), especially if you use insulin or take certain medicines for diabetes mellitus. Hypoglycemia can be a life-threatening condition. Symptoms of hypoglycemia (sleepiness, dizziness, and disorientation) are similar to symptoms of having too much alcohol.  If your health care provider has given you approval to drink alcohol, do so in moderation and use the following guidelines:  Women should not have more than one drink per day, and men should not have more than two drinks per day. One drink is equal to:  12 oz of beer.  5 oz of wine.  1 oz of hard liquor.  Do not drink on an empty stomach.  Keep yourself hydrated. Have water, diet soda, or unsweetened iced tea.  Regular soda, juice, and other mixers might contain a lot of carbohydrates and should be counted. WHAT FOODS ARE NOT RECOMMENDED? As you make food choices, it is important to remember that all foods are not the same. Some foods have fewer nutrients per serving than other  foods, even though they might have the same number of calories or carbohydrates. It is difficult to get your body what it needs when you eat foods with fewer nutrients. Examples of foods that you should avoid that are high in calories and carbohydrates but low in nutrients include:  Trans fats (most processed foods list trans fats on the Nutrition Facts label).  Regular soda.  Juice.  Candy.  Sweets, such as cake, pie, doughnuts, and cookies.  Fried foods. WHAT FOODS CAN I EAT? Have nutrient-rich foods, which will nourish your body and keep you healthy. The food you should eat also will depend on several factors, including:  The calories you need.  The medicines you take.  Your weight.  Your blood glucose level.  Your blood pressure level.  Your cholesterol level. You also should eat a variety of foods, including:  Protein, such as meat, poultry, fish, tofu, nuts, and seeds (lean animal proteins are best).  Fruits.  Vegetables.  Dairy products, such as milk, cheese, and yogurt (low fat is best).  Breads, grains, pasta, cereal, rice, and beans.  Fats such as olive oil, trans fat-free margarine, canola oil, avocado, and olives. DOES EVERYONE WITH DIABETES MELLITUS HAVE THE SAME MEAL PLAN? Because every person with diabetes mellitus is different, there is not one meal plan that works for everyone. It is very important that you meet with a dietitian who will help you create a meal plan that is just right for you. Document Released: 04/02/2005 Document Revised: 07/11/2013 Document Reviewed: 06/02/2013 ExitCare Patient Information 2015 [employer]. This   information is not intended to replace advice given to you by your health care provider. Make sure you discuss any questions you have with your health care provider. DASH Eating Plan DASH stands for "Dietary Approaches to Stop Hypertension." The DASH eating plan is a healthy eating plan that has been shown to reduce high  blood pressure (hypertension). Additional health benefits may include reducing the risk of type 2 diabetes mellitus, heart disease, and stroke. The DASH eating plan may also help with weight loss. WHAT DO I NEED TO KNOW ABOUT THE DASH EATING PLAN? For the DASH eating plan, you will follow these general guidelines:  Choose foods with a percent daily value for sodium of less than 5% (as listed on the food label).  Use salt-free seasonings or herbs instead of table salt or sea salt.  Check with your health care provider or pharmacist before using salt substitutes.  Eat lower-sodium products, often labeled as "lower sodium" or "no salt added."  Eat fresh foods.  Eat more vegetables, fruits, and low-fat dairy products.  Choose whole grains. Look for the word "whole" as the first word in the ingredient list.  Choose fish and skinless chicken or turkey more often than red meat. Limit fish, poultry, and meat to 6 oz (170 g) each day.  Limit sweets, desserts, sugars, and sugary drinks.  Choose heart-healthy fats.  Limit cheese to 1 oz (28 g) per day.  Eat more home-cooked food and less restaurant, buffet, and fast food.  Limit fried foods.  Cook foods using methods other than frying.  Limit canned vegetables. If you do use them, rinse them well to decrease the sodium.  When eating at a restaurant, ask that your food be prepared with less salt, or no salt if possible. WHAT FOODS CAN I EAT? Seek help from a dietitian for individual calorie needs. Grains Whole grain or whole wheat bread. Brown rice. Whole grain or whole wheat pasta. Quinoa, bulgur, and whole grain cereals. Low-sodium cereals. Corn or whole wheat flour tortillas. Whole grain cornbread. Whole grain crackers. Low-sodium crackers. Vegetables Fresh or frozen vegetables (raw, steamed, roasted, or grilled). Low-sodium or reduced-sodium tomato and vegetable juices. Low-sodium or reduced-sodium tomato sauce and paste. Low-sodium  or reduced-sodium canned vegetables.  Fruits All fresh, canned (in natural juice), or frozen fruits. Meat and Other Protein Products Ground beef (85% or leaner), grass-fed beef, or beef trimmed of fat. Skinless chicken or turkey. Ground chicken or turkey. Pork trimmed of fat. All fish and seafood. Eggs. Dried beans, peas, or lentils. Unsalted nuts and seeds. Unsalted canned beans. Dairy Low-fat dairy products, such as skim or 1% milk, 2% or reduced-fat cheeses, low-fat ricotta or cottage cheese, or plain low-fat yogurt. Low-sodium or reduced-sodium cheeses. Fats and Oils Tub margarines without trans fats. Light or reduced-fat mayonnaise and salad dressings (reduced sodium). Avocado. Safflower, olive, or canola oils. Natural peanut or almond butter. Other Unsalted popcorn and pretzels. The items listed above may not be a complete list of recommended foods or beverages. Contact your dietitian for more options. WHAT FOODS ARE NOT RECOMMENDED? Grains White bread. White pasta. White rice. Refined cornbread. Bagels and croissants. Crackers that contain trans fat. Vegetables Creamed or fried vegetables. Vegetables in a cheese sauce. Regular canned vegetables. Regular canned tomato sauce and paste. Regular tomato and vegetable juices. Fruits Dried fruits. Canned fruit in light or heavy syrup. Fruit juice. Meat and Other Protein Products Fatty cuts of meat. Ribs, chicken wings, bacon, sausage, bologna, salami, chitterlings, fatback, hot   dogs, bratwurst, and packaged luncheon meats. Salted nuts and seeds. Canned beans with salt. Dairy Whole or 2% milk, cream, half-and-half, and cream cheese. Whole-fat or sweetened yogurt. Full-fat cheeses or blue cheese. Nondairy creamers and whipped toppings. Processed cheese, cheese spreads, or cheese curds. Condiments Onion and garlic salt, seasoned salt, table salt, and sea salt. Canned and packaged gravies. Worcestershire sauce. Tartar sauce. Barbecue sauce.  Teriyaki sauce. Soy sauce, including reduced sodium. Steak sauce. Fish sauce. Oyster sauce. Cocktail sauce. Horseradish. Ketchup and mustard. Meat flavorings and tenderizers. Bouillon cubes. Hot sauce. Tabasco sauce. Marinades. Taco seasonings. Relishes. Fats and Oils Butter, stick margarine, lard, shortening, ghee, and bacon fat. Coconut, palm kernel, or palm oils. Regular salad dressings. Other Pickles and olives. Salted popcorn and pretzels. The items listed above may not be a complete list of foods and beverages to avoid. Contact your dietitian for more information. WHERE CAN I FIND MORE INFORMATION? National Heart, Lung, and [employer] Institute: www.nhlbi.nih.gov/health/health-topics/topics/dash/ Document Released: 06/25/2011 Document Revised: 11/20/2013 Document Reviewed: 05/10/2013 ExitCare Patient Information 2015 [employer]. This information is not intended to replace advice given to you by your health care provider. Make sure you discuss any questions you have with your health care provider. Fat and Cholesterol Control Diet Fat and cholesterol levels in your blood and organs are influenced by your diet. High levels of fat and cholesterol may lead to diseases of the heart, small and large blood vessels, gallbladder, liver, and pancreas. CONTROLLING FAT AND CHOLESTEROL WITH DIET Although exercise and lifestyle factors are important, your diet is key. That is because certain foods are known to raise cholesterol and others to lower it. The goal is to balance foods for their effect on cholesterol and more importantly, to replace saturated and trans fat with other types of fat, such as monounsaturated fat, polyunsaturated fat, and omega-3 fatty acids. On average, a person should consume no more than 15 to 17 g of saturated fat daily. Saturated and trans fats are considered "bad" fats, and they will raise LDL cholesterol. Saturated fats are primarily found in animal products such as meats, butter,  and cream. However, that does not mean you need to give up all your favorite foods. Today, there are good tasting, low-fat, low-cholesterol substitutes for most of the things you like to eat. Choose low-fat or nonfat alternatives. Choose round or loin cuts of red meat. These types of cuts are lowest in fat and cholesterol. Chicken (without the skin), fish, veal, and ground turkey breast are great choices. Eliminate fatty meats, such as hot dogs and salami. Even shellfish have little or no saturated fat. Have a 3 oz (85 g) portion when you eat lean meat, poultry, or fish. Trans fats are also called "partially hydrogenated oils." They are oils that have been scientifically manipulated so that they are solid at room temperature resulting in a longer shelf life and improved taste and texture of foods in which they are added. Trans fats are found in stick margarine, some tub margarines, cookies, crackers, and baked goods.  When baking and cooking, oils are a great substitute for butter. The monounsaturated oils are especially beneficial since it is believed they lower LDL and raise HDL. The oils you should avoid entirely are saturated tropical oils, such as coconut and palm.  Remember to eat a lot from food groups that are naturally free of saturated and trans fat, including fish, fruit, vegetables, beans, grains (barley, rice, couscous, bulgur wheat), and pasta (without cream sauces).  IDENTIFYING FOODS THAT   LOWER FAT AND CHOLESTEROL  Soluble fiber may lower your cholesterol. This type of fiber is found in fruits such as apples, vegetables such as broccoli, potatoes, and carrots, legumes such as beans, peas, and lentils, and grains such as barley. Foods fortified with plant sterols (phytosterol) may also lower cholesterol. You should eat at least 2 g per day of these foods for a cholesterol lowering effect.  Read package labels to identify low-saturated fats, trans fat free, and low-fat foods at the supermarket.  Select cheeses that have only 2 to 3 g saturated fat per ounce. Use a heart-healthy tub margarine that is free of trans fats or partially hydrogenated oil. When buying baked goods (cookies, crackers), avoid partially hydrogenated oils. Breads and muffins should be made from whole grains (whole-wheat or whole oat flour, instead of "flour" or "enriched flour"). Buy non-creamy canned soups with reduced salt and no added fats.  FOOD PREPARATION TECHNIQUES  Never deep-fry. If you must fry, either stir-fry, which uses very little fat, or use non-stick cooking sprays. When possible, broil, bake, or roast meats, and steam vegetables. Instead of putting butter or margarine on vegetables, use lemon and herbs, applesauce, and cinnamon (for squash and sweet potatoes). Use nonfat yogurt, salsa, and low-fat dressings for salads.  LOW-SATURATED FAT / LOW-FAT FOOD SUBSTITUTES Meats / Saturated Fat (g)  Avoid: Steak, marbled (3 oz/85 g) / 11 g  Choose: Steak, lean (3 oz/85 g) / 4 g  Avoid: Hamburger (3 oz/85 g) / 7 g  Choose: Hamburger, lean (3 oz/85 g) / 5 g  Avoid: Ham (3 oz/85 g) / 6 g  Choose: Ham, lean cut (3 oz/85 g) / 2.4 g  Avoid: Chicken, with skin, dark meat (3 oz/85 g) / 4 g  Choose: Chicken, skin removed, dark meat (3 oz/85 g) / 2 g  Avoid: Chicken, with skin, light meat (3 oz/85 g) / 2.5 g  Choose: Chicken, skin removed, light meat (3 oz/85 g) / 1 g Dairy / Saturated Fat (g)  Avoid: Whole milk (1 cup) / 5 g  Choose: Low-fat milk, 2% (1 cup) / 3 g  Choose: Low-fat milk, 1% (1 cup) / 1.5 g  Choose: Skim milk (1 cup) / 0.3 g  Avoid: Hard cheese (1 oz/28 g) / 6 g  Choose: Skim milk cheese (1 oz/28 g) / 2 to 3 g  Avoid: Cottage cheese, 4% fat (1 cup) / 6.5 g  Choose: Low-fat cottage cheese, 1% fat (1 cup) / 1.5 g  Avoid: Ice cream (1 cup) / 9 g  Choose: Sherbet (1 cup) / 2.5 g  Choose: Nonfat frozen yogurt (1 cup) / 0.3 g  Choose: Frozen fruit bar / trace  Avoid: Whipped  cream (1 tbs) / 3.5 g  Choose: Nondairy whipped topping (1 tbs) / 1 g Condiments / Saturated Fat (g)  Avoid: Mayonnaise (1 tbs) / 2 g  Choose: Low-fat mayonnaise (1 tbs) / 1 g  Avoid: Butter (1 tbs) / 7 g  Choose: Extra light margarine (1 tbs) / 1 g  Avoid: Coconut oil (1 tbs) / 11.8 g  Choose: Olive oil (1 tbs) / 1.8 g  Choose: Corn oil (1 tbs) / 1.7 g  Choose: Safflower oil (1 tbs) / 1.2 g  Choose: Sunflower oil (1 tbs) / 1.4 g  Choose: Soybean oil (1 tbs) / 2.4 g  Choose: Canola oil (1 tbs) / 1 g Document Released: 07/06/2005 Document Revised: 10/31/2012 Document Reviewed: 10/04/2013 Exit[employer] Patient Information 2015 [employer]. This information is not intended to replace advice given to you by your health care provider. Make sure you discuss any questions you have with your health care provider.  

## 2014-09-26 ENCOUNTER — Ambulatory Visit
Admission: RE | Admit: 2014-09-26 | Discharge: 2014-09-26 | Disposition: A | Discharge status: Home / Self Care | Payer: Medicare HMO | Source: Ambulatory Visit | Attending: Internal Medicine | Admitting: Internal Medicine

## 2014-09-26 DIAGNOSIS — Z1231 Encounter for screening mammogram for malignant neoplasm of breast: Secondary | ICD-10-CM

## 2014-09-26 LAB — COMPLETE METABOLIC PANEL WITH GFR
ALT: 25 U/L (ref 0–35)
AST: 27 U/L (ref 0–37)
Albumin: 4.2 g/dL (ref 3.5–5.2)
Alkaline Phosphatase: 87 U/L (ref 39–117)
BUN: 20 mg/dL (ref 6–23)
CALCIUM: 9.8 mg/dL (ref 8.4–10.5)
CHLORIDE: 96 meq/L (ref 96–112)
CO2: 29 meq/L (ref 19–32)
Creat: 0.98 mg/dL (ref 0.50–1.10)
GFR, EST AFRICAN AMERICAN: 64 mL/min
GFR, EST NON AFRICAN AMERICAN: 55 mL/min — AB
Glucose, Bld: 193 mg/dL — ABNORMAL HIGH (ref 70–99)
Potassium: 3.9 mEq/L (ref 3.5–5.3)
Sodium: 133 mEq/L — ABNORMAL LOW (ref 135–145)
TOTAL PROTEIN: 6.8 g/dL (ref 6.0–8.3)
Total Bilirubin: 0.3 mg/dL (ref 0.2–1.2)

## 2014-09-26 LAB — TSH: TSH: 0.524 u[IU]/mL (ref 0.350–4.500)

## 2014-09-28 ENCOUNTER — Telehealth: Payer: Self-pay

## 2014-09-28 NOTE — Telephone Encounter (Signed)
-----   Message from Lorayne Marek, MD sent at 09/26/2014  9:12 AM EST ----- Call and let the  patient know that her thyroid test is in normal range, continue with current dose of levothyroxine 100 mcg daily.

## 2014-09-28 NOTE — Telephone Encounter (Signed)
Patient is not available Left message on voice mail to return our call

## 2014-10-01 NOTE — Telephone Encounter (Signed)
Patient returned call from nurse about results, please f/u with pt.

## 2014-10-16 ENCOUNTER — Other Ambulatory Visit: Payer: Self-pay | Admitting: Gastroenterology

## 2014-11-05 ENCOUNTER — Encounter: Payer: Medicare HMO | Admitting: Gynecology

## 2014-11-13 ENCOUNTER — Emergency Department (HOSPITAL_COMMUNITY): Payer: Medicare HMO

## 2014-11-13 ENCOUNTER — Encounter (HOSPITAL_COMMUNITY): Payer: Self-pay

## 2014-11-13 ENCOUNTER — Emergency Department (HOSPITAL_COMMUNITY)
Admission: EM | Admit: 2014-11-13 | Discharge: 2014-11-13 | Disposition: A | Discharge status: Home / Self Care | Payer: Medicare HMO | Attending: Emergency Medicine | Admitting: Emergency Medicine

## 2014-11-13 DIAGNOSIS — E785 Hyperlipidemia, unspecified: Secondary | ICD-10-CM | POA: Insufficient documentation

## 2014-11-13 DIAGNOSIS — R0989 Other specified symptoms and signs involving the circulatory and respiratory systems: Secondary | ICD-10-CM

## 2014-11-13 DIAGNOSIS — E119 Type 2 diabetes mellitus without complications: Secondary | ICD-10-CM | POA: Diagnosis not present

## 2014-11-13 DIAGNOSIS — E079 Disorder of thyroid, unspecified: Secondary | ICD-10-CM | POA: Insufficient documentation

## 2014-11-13 DIAGNOSIS — Z7982 Long term (current) use of aspirin: Secondary | ICD-10-CM | POA: Insufficient documentation

## 2014-11-13 DIAGNOSIS — I10 Essential (primary) hypertension: Secondary | ICD-10-CM | POA: Insufficient documentation

## 2014-11-13 DIAGNOSIS — H9209 Otalgia, unspecified ear: Secondary | ICD-10-CM | POA: Insufficient documentation

## 2014-11-13 DIAGNOSIS — Z794 Long term (current) use of insulin: Secondary | ICD-10-CM | POA: Insufficient documentation

## 2014-11-13 DIAGNOSIS — R519 Headache, unspecified: Secondary | ICD-10-CM

## 2014-11-13 DIAGNOSIS — Z79899 Other long term (current) drug therapy: Secondary | ICD-10-CM | POA: Diagnosis not present

## 2014-11-13 DIAGNOSIS — Z87448 Personal history of other diseases of urinary system: Secondary | ICD-10-CM | POA: Diagnosis not present

## 2014-11-13 DIAGNOSIS — R0981 Nasal congestion: Secondary | ICD-10-CM | POA: Diagnosis present

## 2014-11-13 DIAGNOSIS — J3489 Other specified disorders of nose and nasal sinuses: Secondary | ICD-10-CM | POA: Insufficient documentation

## 2014-11-13 DIAGNOSIS — R42 Dizziness and giddiness: Secondary | ICD-10-CM | POA: Diagnosis not present

## 2014-11-13 DIAGNOSIS — R51 Headache: Secondary | ICD-10-CM

## 2014-11-13 LAB — I-STAT CHEM 8, ED
BUN: 30 mg/dL — ABNORMAL HIGH (ref 6–23)
CALCIUM ION: 1.2 mmol/L (ref 1.13–1.30)
Chloride: 96 mmol/L (ref 96–112)
Creatinine, Ser: 1.1 mg/dL (ref 0.50–1.10)
GLUCOSE: 293 mg/dL — AB (ref 70–99)
HCT: 33 % — ABNORMAL LOW (ref 36.0–46.0)
HEMOGLOBIN: 11.2 g/dL — AB (ref 12.0–15.0)
Potassium: 4.7 mmol/L (ref 3.5–5.1)
Sodium: 132 mmol/L — ABNORMAL LOW (ref 135–145)
TCO2: 21 mmol/L (ref 0–100)

## 2014-11-13 LAB — CBG MONITORING, ED: Glucose-Capillary: 281 mg/dL — ABNORMAL HIGH (ref 70–99)

## 2014-11-13 MED ORDER — MECLIZINE HCL 25 MG PO TABS
12.5000 mg | ORAL_TABLET | Freq: Once | ORAL | Status: AC
  Administered 2014-11-13: 12.5 mg via ORAL
  Filled 2014-11-13: qty 1

## 2014-11-13 MED ORDER — MECLIZINE HCL 12.5 MG PO TABS: 12.5000 mg | ORAL_TABLET | Freq: Three times a day (TID) | ORAL | Status: DC | PRN

## 2014-11-13 NOTE — Discharge Instructions (Signed)
Benign Positional Vertigo Vertigo means you feel like you or your surroundings are moving when they are not. Benign positional vertigo is the most common form of vertigo. Benign means that the cause of your condition is not serious. Benign positional vertigo is more common in older adults. CAUSES  Benign positional vertigo is the result of an upset in the labyrinth system. This is an area in the middle ear that helps control your balance. This may be caused by a viral infection, head injury, or repetitive motion. However, often no specific cause is found. SYMPTOMS  Symptoms of benign positional vertigo occur when you move your head or eyes in different directions. Some of the symptoms may include:  Loss of balance and falls.  Vomiting.  Blurred vision.  Dizziness.  Nausea.  Involuntary eye movements (nystagmus). DIAGNOSIS  Benign positional vertigo is usually diagnosed by physical exam. If the specific cause of your benign positional vertigo is unknown, your caregiver may perform imaging tests, such as magnetic resonance imaging (MRI) or computed tomography (CT). TREATMENT  Your caregiver may recommend movements or procedures to correct the benign positional vertigo. Medicines such as meclizine, benzodiazepines, and medicines for nausea may be used to treat your symptoms. In rare cases, if your symptoms are caused by certain conditions that affect the inner ear, you may need surgery. HOME CARE INSTRUCTIONS   Follow your caregiver's instructions.  Move slowly. Do not make sudden body or head movements.  Avoid driving.  Avoid operating heavy machinery.  Avoid performing any tasks that would be dangerous to you or others during a vertigo episode.  Drink enough fluids to keep your urine clear or pale yellow. SEEK IMMEDIATE MEDICAL CARE IF:   You develop problems with walking, weakness, numbness, or using your arms, hands, or legs.  You have difficulty speaking.  You develop  severe headaches.  Your nausea or vomiting continues or gets worse.  You develop visual changes.  Your family or friends notice any behavioral changes.  Your condition gets worse.  You have a fever.  You develop a stiff neck or sensitivity to light. MAKE SURE YOU:   Understand these instructions.  Will watch your condition.  Will get help right away if you are not doing well or get worse. Document Released: 04/13/2006 Document Revised: 09/28/2011 Document Reviewed: 03/26/2011 [employer] Patient Information 2015 [employer]. This information is not intended to replace advice given to you by your health care provider. Make sure you discuss any questions you have with your health care provider.    

## 2014-11-13 NOTE — ED Provider Notes (Signed)
CSN: [id number]     Arrival date & time 11/13/14  1129 History   First MD Initiated Contact with Patient 11/13/14 1133     Chief Complaint  Patient presents with  . Nasal Congestion  . Dizziness  . Otalgia     (Consider location/radiation/quality/duration/timing/severity/associated sxs/prior Treatment) HPI Comments: Patient here complaining of ear fullness without drainage. Notes some associated dizziness as well too worse with moving her head certain directions consistent with her vertigo which she's had before in the past. Denies any ataxia. No headache. Patient also notes itching at her Ears which is consistent with her seasonal allergies. Some nausea without vomiting. Went to urgent care in those notes are reviewed. The patient denied having any syncope. She denied having any stroke symptoms. Her and her daughter both state that the report from the urgent care center where she went is inaccurate with regards to her symptoms. Notes URI symptoms at this time. No medications given prior to arrival  Patient is a 81 y.o. female presenting with dizziness and ear pain. The history is provided by the patient.  Dizziness Otalgia   Past Medical History  Diagnosis Date  . Diabetes mellitus   . Hypertension   . Hyperlipidemia   . Sinusitis   . Thyroid disease   . OAB (overactive bladder)    Past Surgical History  Procedure Laterality Date  . Abdominal hysterectomy    . Knee arthroscopy     Family History  Problem Relation Age of Onset  . Diabetes Mother   . Hypertension Mother   . Diabetes Sister   . Diabetes Brother    History  Substance Use Topics  . Smoking status: Never Smoker   . Smokeless tobacco: Never Used  . Alcohol Use: No   OB History    Gravida Para Term Preterm AB TAB SAB Ectopic Multiple Living   5 5        5      Review of Systems  HENT: Positive for ear pain.   Neurological: Positive for dizziness.  All other systems reviewed and are  negative.     Allergies  Sulfonamide derivatives  Home Medications   Prior to Admission medications   Medication Sig Start Date End Date Taking? Authorizing Provider  alendronate (FOSAMAX) 70 MG tablet Take 70 mg by mouth every 7 (seven) days.  03/11/13  Yes Historical Provider, MD  aspirin EC 81 MG tablet Take 81 mg by mouth daily.   Yes Historical Provider, MD  cholecalciferol (VITAMIN D) 1000 UNITS tablet Take 1,000 Units by mouth daily.   Yes Historical Provider, MD  cyclobenzaprine (FLEXERIL) 5 MG tablet Take 5 mg by mouth 3 (three) times daily as needed for muscle spasms.   Yes Historical Provider, MD  estradiol (ESTRACE VAGINAL) 0.1 MG/GM vaginal cream Apply vaginal twice a week 11/01/13  Yes Terrance Mass, MD  Insulin Detemir (LEVEMIR FLEXTOUCH) 100 UNIT/ML Pen Inject 7 Units into the skin 2 (two) times daily. Patient taking differently: Inject 7 Units into the skin 3 (three) times daily.  09/25/14  Yes Lorayne Marek, MD  levothyroxine (SYNTHROID, LEVOTHROID) 100 MCG tablet Take 1 tablet (100 mcg total) by mouth daily. 09/25/14  Yes Lorayne Marek, MD  losartan (COZAAR) 25 MG tablet Take 1 tablet (25 mg total) by mouth daily. 09/25/14  Yes Deepak Advani, MD  ondansetron (ZOFRAN) 4 MG tablet Take 1 tablet (4 mg total) by mouth every 8 (eight) hours as needed for nausea. 03/28/13  Yes Rolland Porter,  MD  pantoprazole (PROTONIX) 40 MG tablet Take 1 tablet (40 mg total) by mouth daily. 09/25/14  Yes Deepak Advani, MD  insulin aspart (NOVOLOG) 100 UNIT/ML injection Inject 6-10 Units into the skin 3 (three) times daily before meals.     Historical Provider, MD  iron polysaccharides (NIFEREX) 150 MG capsule Take 150 mg by mouth daily.    Historical Provider, MD  loratadine (CLARITIN) 10 MG tablet Take 10 mg by mouth daily.    Historical Provider, MD  metFORMIN (GLUCOPHAGE) 1000 MG tablet Take 1,000 mg by mouth 2 (two) times daily with a meal.    Historical Provider, MD  mometasone (NASONEX) 50 MCG/ACT  nasal spray Place 2 sprays into the nose daily.    Historical Provider, MD  Omega-3 Fatty Acids (FISH OIL) 1000 MG CAPS Take by mouth.    Historical Provider, MD  simvastatin (ZOCOR) 40 MG tablet Take 40 mg by mouth at bedtime.     Historical Provider, MD  sucralfate (CARAFATE) 1 G tablet Take 1 tablet (1 g total) by mouth 4 (four) times daily -  with meals and at bedtime. 08/17/14   Billy Fischer, MD  traMADol (ULTRAM) 50 MG tablet Take 1 tablet (50 mg total) by mouth every 6 (six) hours as needed for pain. Patient not taking: Reported on 11/13/2014 02/24/13   Billy Fischer, MD   BP 162/70 mmHg  Ht 5\' 7"  (1.702 m)  Wt 147 lb (66.679 kg)  BMI 23.02 kg/m2  SpO2 100% Physical Exam  Constitutional: She is oriented to person, place, and time. She appears well-developed and well-nourished.  Non-toxic appearance. No distress.  HENT:  Head: Normocephalic and atraumatic.  Eyes: Conjunctivae, EOM and lids are normal. Pupils are equal, round, and reactive to light.  Neck: Normal range of motion. Neck supple. No tracheal deviation present. No thyroid mass present.  Cardiovascular: Normal rate, regular rhythm and normal heart sounds.  Exam reveals no gallop.   No murmur heard. Pulmonary/Chest: Effort normal and breath sounds normal. No stridor. No respiratory distress. She has no decreased breath sounds. She has no wheezes. She has no rhonchi. She has no rales.  Abdominal: Soft. Normal appearance and bowel sounds are normal. She exhibits no distension. There is no tenderness. There is no rebound and no CVA tenderness.  Musculoskeletal: Normal range of motion. She exhibits no edema or tenderness.  Neurological: She is alert and oriented to person, place, and time. She has normal strength. No cranial nerve deficit or sensory deficit. GCS eye subscore is 4. GCS verbal subscore is 5. GCS motor subscore is 6.  Skin: Skin is warm and dry. No abrasion and no rash noted.  Psychiatric: She has a normal mood and  affect. Her speech is normal and behavior is normal.  Nursing note and vitals reviewed.   ED Course  Procedures (including critical care time) Labs Review Labs Reviewed  I-STAT CHEM 8, ED    Imaging Review No results found.   EKG Interpretation None      MDM   Final diagnoses:  Sinus complaint    Patient given medications for vertigo. And feels better. Suspect that this is peripheral vertigo and not central vertigo. No focal neurological deficits noted. Will follow-up with her Dr. as needed    Lacretia Leigh, MD 11/13/14 1434

## 2014-11-13 NOTE — ED Notes (Signed)
Bed: [facility]37 Expected date: 11/13/14 Expected time:  Means of arrival:  Comments: [name] [name] 48y female

## 2014-11-17 ENCOUNTER — Other Ambulatory Visit: Payer: Self-pay | Admitting: Gynecology

## 2014-12-05 ENCOUNTER — Ambulatory Visit (INDEPENDENT_AMBULATORY_CARE_PROVIDER_SITE_OTHER): Payer: Medicare HMO | Admitting: Gynecology

## 2014-12-05 ENCOUNTER — Encounter: Payer: Self-pay | Admitting: Gynecology

## 2014-12-05 VITALS — BP 130/70 | Ht 65.5 in | Wt 148.0 lb

## 2014-12-05 DIAGNOSIS — N952 Postmenopausal atrophic vaginitis: Secondary | ICD-10-CM

## 2014-12-05 DIAGNOSIS — M8580 Other specified disorders of bone density and structure, unspecified site: Secondary | ICD-10-CM

## 2014-12-05 DIAGNOSIS — Z79890 Hormone replacement therapy (postmenopausal): Secondary | ICD-10-CM

## 2014-12-05 DIAGNOSIS — Z01419 Encounter for gynecological examination (general) (routine) without abnormal findings: Secondary | ICD-10-CM | POA: Diagnosis not present

## 2014-12-05 MED ORDER — NONFORMULARY OR COMPOUNDED ITEM: Status: DC

## 2014-12-05 NOTE — Patient Instructions (Signed)

## 2014-12-05 NOTE — Progress Notes (Signed)
[name] [name] Dec 08, 1933 [id number]   History:    81 y.o.  for annual gyn exam who ran out of her vaginal estrogen and wanted to see if we can get her a cheaper brand because of the cost. It has helped her with her vaginal atrophy. Patient was seen as a new patient for the first time last year she was a former patient of Dr. Ubaldo Glassing.She brought some of her records with her that were reviewed. The patient in the past had history of overactive bladder, osteopenia, hypertension, type 2 diabetes and dyslipidemia. Her primary physician is Dr. Lorenda Hatchet who has been doing her blood work. It appears that she had a bone density study done in 2010 were her lowest T score was at the AP spine and -2.2. I could not find and the report any previous prepared studies. It appears her left femoral neck had a T score of -1.3 but I do not see any FRAX Study having been done. She has informed me that her PCP in September of 2014 start her on Fosamax 70 mg q. weekly. She is taking her calcium and vitamin D and is active.  Patient had a colonoscopy in 2012 she has had benign polyps removed in the past. Also she had many years ago in 1967 an abdominal hysterectomy with unilateral oophorectomy for what appears to be heavy cycles and fibroid uterus questionable cyst. She stated all was benign. Patient had a normal mammogram last year. Patient with no previous history of any abnormal Pap smear. All her vaccines are up-to-date per her PCP.  Past medical history,surgical history, family history and social history were all reviewed and documented in the EPIC chart.  Gynecologic History No LMP recorded. Patient has had a hysterectomy. Contraception: status post hysterectomy Last Pap: 2012. Results were: normal Last mammogram: 2016. Results were: normal  Obstetric History OB History  Gravida Para Term Preterm AB SAB TAB Ectopic Multiple Living  5 5        5     # Outcome Date GA Lbr Len/2nd Weight Sex Delivery Anes PTL Lv  5  Para           4 Para           3 Para           2 Para           1 Para                ROS: A ROS was performed and pertinent positives and negatives are included in the history.  GENERAL: No fevers or chills. HEENT: No change in vision, no earache, sore throat or sinus congestion. NECK: No pain or stiffness. CARDIOVASCULAR: No chest pain or pressure. No palpitations. PULMONARY: No shortness of breath, cough or wheeze. GASTROINTESTINAL: No abdominal pain, nausea, vomiting or diarrhea, melena or bright red blood per rectum. GENITOURINARY: No urinary frequency, urgency, hesitancy or dysuria. MUSCULOSKELETAL: No joint or muscle pain, no back pain, no recent trauma. DERMATOLOGIC: No rash, no itching, no lesions. ENDOCRINE: No polyuria, polydipsia, no heat or cold intolerance. No recent change in weight. HEMATOLOGICAL: No anemia or easy bruising or bleeding. NEUROLOGIC: No headache, seizures, numbness, tingling or weakness. PSYCHIATRIC: No depression, no loss of interest in normal activity or change in sleep pattern.     Exam: chaperone present  BP 130/70 mmHg  Ht 5' 5.5" (1.664 m)  Wt 148 lb (67.132 kg)  BMI 24.25 kg/m2  Body mass index is  24.25 kg/(m^2).  General appearance : Well developed well nourished female. No acute distress HEENT: Eyes: no retinal hemorrhage or exudates,  Neck supple, trachea midline, no carotid bruits, no thyroidmegaly Lungs: Clear to auscultation, no rhonchi or wheezes, or rib retractions  Heart: Regular rate and rhythm, no murmurs or gallops Breast:Examined in sitting and supine position were symmetrical in appearance, no palpable masses or tenderness,  no skin retraction, no nipple inversion, no nipple discharge, no skin discoloration, no axillary or supraclavicular lymphadenopathy Abdomen: no palpable masses or tenderness, no rebound or guarding Extremities: no edema or skin discoloration or tenderness  Pelvic:  Bartholin, Urethra, Skene Glands: Within  normal limits             Vagina: No gross lesions or discharge, vaginal atrophy  Cervix: Absent  Uterus  absent  Adnexa  Without masses or tenderness  Anus and perineum  normal   Rectovaginal  normal sphincter tone without palpated masses or tenderness             Hemoccult PCP provides     Assessment/Plan:  81 y.o. female for annual exam has done well with vaginal estrogen twice a week. We are going to prescribe her compounded estradiol from 02% to apply twice a week. Pap smear not indicated according to the new guidelines. Patient was provided with requisition to schedule her bone density study here in our office. We discussed importance of calcium vitamin D and regular exercise for osteoporosis prevention.   Terrance Mass MD, 12:05 PM 12/05/2014

## 2014-12-25 ENCOUNTER — Ambulatory Visit (INDEPENDENT_AMBULATORY_CARE_PROVIDER_SITE_OTHER): Payer: Medicare HMO | Admitting: Women's Health

## 2014-12-25 ENCOUNTER — Encounter: Payer: Self-pay | Admitting: Women's Health

## 2014-12-25 VITALS — BP 124/80 | Ht 65.0 in | Wt 146.0 lb

## 2014-12-25 DIAGNOSIS — N898 Other specified noninflammatory disorders of vagina: Secondary | ICD-10-CM

## 2014-12-25 DIAGNOSIS — R350 Frequency of micturition: Secondary | ICD-10-CM | POA: Diagnosis not present

## 2014-12-25 DIAGNOSIS — B373 Candidiasis of vulva and vagina: Secondary | ICD-10-CM

## 2014-12-25 DIAGNOSIS — B3731 Acute candidiasis of vulva and vagina: Secondary | ICD-10-CM

## 2014-12-25 LAB — URINALYSIS W MICROSCOPIC + REFLEX CULTURE
BILIRUBIN URINE: NEGATIVE
GLUCOSE, UA: 100 mg/dL — AB
HGB URINE DIPSTICK: NEGATIVE
Ketones, ur: NEGATIVE mg/dL
LEUKOCYTES UA: NEGATIVE
NITRITE: NEGATIVE
PH: 5.5 (ref 5.0–8.0)
PROTEIN: NEGATIVE mg/dL
SPECIFIC GRAVITY, URINE: 1.01 (ref 1.005–1.030)
Urobilinogen, UA: 0.2 mg/dL (ref 0.0–1.0)

## 2014-12-25 LAB — WET PREP FOR TRICH, YEAST, CLUE
CLUE CELLS WET PREP: NONE SEEN
Trich, Wet Prep: NONE SEEN

## 2014-12-25 MED ORDER — FLUCONAZOLE 150 MG PO TABS: ORAL_TABLET | ORAL | Status: DC

## 2014-12-25 NOTE — Patient Instructions (Signed)

## 2014-12-25 NOTE — Progress Notes (Signed)
Patient ID: [name] [name], female   DOB: [birth year], 81 y.o.   MRN: [id number] Presents with complaint of increased white vaginal discharge with irritation and intense itching for several days. Increased urinary frequency with burning at initiation of stream, none at end of stream. Type 2 diabetes on insulin. Vaginal atrophy on vaginal estrogen twice weekly. TAH for fibroids. Denies abdominal pain or fever.  Exam: Appears well. External genitalia extremely erythematous, wet prep done with a Q-tip,  positive for moderate yeast with hyphae. UA: Negative, trace glucose.  Yeast vaginitis Insulin-dependent type 2 diabetes  Plan: Diflucan 150 by mouth today repeat in 3 days if needed, call if no relief of symptoms, yeast prevention discussed. Aware of importance of blood sugar control in relationship to yeast prevention.

## 2014-12-26 ENCOUNTER — Ambulatory Visit: Payer: Medicare HMO | Admitting: Internal Medicine

## 2015-01-01 ENCOUNTER — Other Ambulatory Visit: Payer: Self-pay | Admitting: Internal Medicine

## 2015-01-03 NOTE — Telephone Encounter (Signed)
Patient came into facility to request a med refill for simvastatin (ZOCOR) 40 MG tablet. Please f/u with pt.

## 2015-01-04 ENCOUNTER — Ambulatory Visit: Payer: Medicare HMO | Attending: Internal Medicine | Admitting: Internal Medicine

## 2015-01-04 ENCOUNTER — Encounter: Payer: Self-pay | Admitting: Internal Medicine

## 2015-01-04 VITALS — BP 130/70 | HR 77 | Temp 98.0°F | Resp 16 | Wt 152.2 lb

## 2015-01-04 DIAGNOSIS — E785 Hyperlipidemia, unspecified: Secondary | ICD-10-CM | POA: Diagnosis not present

## 2015-01-04 DIAGNOSIS — E039 Hypothyroidism, unspecified: Secondary | ICD-10-CM | POA: Diagnosis not present

## 2015-01-04 DIAGNOSIS — E119 Type 2 diabetes mellitus without complications: Secondary | ICD-10-CM | POA: Insufficient documentation

## 2015-01-04 DIAGNOSIS — Z794 Long term (current) use of insulin: Secondary | ICD-10-CM | POA: Insufficient documentation

## 2015-01-04 DIAGNOSIS — Z7982 Long term (current) use of aspirin: Secondary | ICD-10-CM | POA: Insufficient documentation

## 2015-01-04 DIAGNOSIS — E139 Other specified diabetes mellitus without complications: Secondary | ICD-10-CM

## 2015-01-04 DIAGNOSIS — E038 Other specified hypothyroidism: Secondary | ICD-10-CM

## 2015-01-04 DIAGNOSIS — Z79899 Other long term (current) drug therapy: Secondary | ICD-10-CM | POA: Insufficient documentation

## 2015-01-04 DIAGNOSIS — I10 Essential (primary) hypertension: Secondary | ICD-10-CM | POA: Diagnosis not present

## 2015-01-04 LAB — POCT GLYCOSYLATED HEMOGLOBIN (HGB A1C): Hemoglobin A1C: 8.2

## 2015-01-04 LAB — GLUCOSE, POCT (MANUAL RESULT ENTRY): POC GLUCOSE: 191 mg/dL — AB (ref 70–99)

## 2015-01-04 MED ORDER — SIMVASTATIN 40 MG PO TABS: 40.0000 mg | ORAL_TABLET | Freq: Every evening | ORAL | Status: DC

## 2015-01-04 NOTE — Patient Instructions (Signed)
Fat and Cholesterol Control Diet Fat and cholesterol levels in your blood and organs are influenced by your diet. High levels of fat and cholesterol may lead to diseases of the heart, small and large blood vessels, gallbladder, liver, and pancreas. CONTROLLING FAT AND CHOLESTEROL WITH DIET Although exercise and lifestyle factors are important, your diet is key. That is because certain foods are known to raise cholesterol and others to lower it. The goal is to balance foods for their effect on cholesterol and more importantly, to replace saturated and trans fat with other types of fat, such as monounsaturated fat, polyunsaturated fat, and omega-3 fatty acids. On average, a person should consume no more than 15 to 17 g of saturated fat daily. Saturated and trans fats are considered "bad" fats, and they will raise LDL cholesterol. Saturated fats are primarily found in animal products such as meats, butter, and cream. However, that does not mean you need to give up all your favorite foods. Today, there are good tasting, low-fat, low-cholesterol substitutes for most of the things you like to eat. Choose low-fat or nonfat alternatives. Choose round or loin cuts of red meat. These types of cuts are lowest in fat and cholesterol. Chicken (without the skin), fish, veal, and ground turkey breast are great choices. Eliminate fatty meats, such as hot dogs and salami. Even shellfish have little or no saturated fat. Have a 3 oz (85 g) portion when you eat lean meat, poultry, or fish. Trans fats are also called "partially hydrogenated oils." They are oils that have been scientifically manipulated so that they are solid at room temperature resulting in a longer shelf life and improved taste and texture of foods in which they are added. Trans fats are found in stick margarine, some tub margarines, cookies, crackers, and baked goods.  When baking and cooking, oils are a great substitute for butter. The monounsaturated oils are  especially beneficial since it is believed they lower LDL and raise HDL. The oils you should avoid entirely are saturated tropical oils, such as coconut and palm.  Remember to eat a lot from food groups that are naturally free of saturated and trans fat, including fish, fruit, vegetables, beans, grains (barley, rice, couscous, bulgur wheat), and pasta (without cream sauces).  IDENTIFYING FOODS THAT LOWER FAT AND CHOLESTEROL  Soluble fiber may lower your cholesterol. This type of fiber is found in fruits such as apples, vegetables such as broccoli, potatoes, and carrots, legumes such as beans, peas, and lentils, and grains such as barley. Foods fortified with plant sterols (phytosterol) may also lower cholesterol. You should eat at least 2 g per day of these foods for a cholesterol lowering effect.  Read package labels to identify low-saturated fats, trans fat free, and low-fat foods at the supermarket. Select cheeses that have only 2 to 3 g saturated fat per ounce. Use a heart-healthy tub margarine that is free of trans fats or partially hydrogenated oil. When buying baked goods (cookies, crackers), avoid partially hydrogenated oils. Breads and muffins should be made from whole grains (whole-wheat or whole oat flour, instead of "flour" or "enriched flour"). Buy non-creamy canned soups with reduced salt and no added fats.  FOOD PREPARATION TECHNIQUES  Never deep-fry. If you must fry, either stir-fry, which uses very little fat, or use non-stick cooking sprays. When possible, broil, bake, or roast meats, and steam vegetables. Instead of putting butter or margarine on vegetables, use lemon and herbs, applesauce, and cinnamon (for squash and sweet potatoes). Use nonfat   yogurt, salsa, and low-fat dressings for salads.  LOW-SATURATED FAT / LOW-FAT FOOD SUBSTITUTES Meats / Saturated Fat (g)  Avoid: Steak, marbled (3 oz/85 g) / 11 g  Choose: Steak, lean (3 oz/85 g) / 4 g  Avoid: Hamburger (3 oz/85 g) / 7  g  Choose: Hamburger, lean (3 oz/85 g) / 5 g  Avoid: Ham (3 oz/85 g) / 6 g  Choose: Ham, lean cut (3 oz/85 g) / 2.4 g  Avoid: Chicken, with skin, dark meat (3 oz/85 g) / 4 g  Choose: Chicken, skin removed, dark meat (3 oz/85 g) / 2 g  Avoid: Chicken, with skin, light meat (3 oz/85 g) / 2.5 g  Choose: Chicken, skin removed, light meat (3 oz/85 g) / 1 g Dairy / Saturated Fat (g)  Avoid: Whole milk (1 cup) / 5 g  Choose: Low-fat milk, 2% (1 cup) / 3 g  Choose: Low-fat milk, 1% (1 cup) / 1.5 g  Choose: Skim milk (1 cup) / 0.3 g  Avoid: Hard cheese (1 oz/28 g) / 6 g  Choose: Skim milk cheese (1 oz/28 g) / 2 to 3 g  Avoid: Cottage cheese, 4% fat (1 cup) / 6.5 g  Choose: Low-fat cottage cheese, 1% fat (1 cup) / 1.5 g  Avoid: Ice cream (1 cup) / 9 g  Choose: Sherbet (1 cup) / 2.5 g  Choose: Nonfat frozen yogurt (1 cup) / 0.3 g  Choose: Frozen fruit bar / trace  Avoid: Whipped cream (1 tbs) / 3.5 g  Choose: Nondairy whipped topping (1 tbs) / 1 g Condiments / Saturated Fat (g)  Avoid: Mayonnaise (1 tbs) / 2 g  Choose: Low-fat mayonnaise (1 tbs) / 1 g  Avoid: Butter (1 tbs) / 7 g  Choose: Extra light margarine (1 tbs) / 1 g  Avoid: Coconut oil (1 tbs) / 11.8 g  Choose: Olive oil (1 tbs) / 1.8 g  Choose: Corn oil (1 tbs) / 1.7 g  Choose: Safflower oil (1 tbs) / 1.2 g  Choose: Sunflower oil (1 tbs) / 1.4 g  Choose: Soybean oil (1 tbs) / 2.4 g  Choose: Canola oil (1 tbs) / 1 g Document Released: 07/06/2005 Document Revised: 10/31/2012 Document Reviewed: 10/04/2013 Exit[employer] Patient Information 2015 [employer]. This information is not intended to replace advice given to you by your health care provider. Make sure you discuss any questions you have with your health care provider. DASH Eating Plan DASH stands for "Dietary Approaches to Stop Hypertension." The DASH eating plan is a healthy eating plan that has been shown to reduce high blood pressure  (hypertension). Additional health benefits may include reducing the risk of type 2 diabetes mellitus, heart disease, and stroke. The DASH eating plan may also help with weight loss. WHAT DO I NEED TO KNOW ABOUT THE DASH EATING PLAN? For the DASH eating plan, you will follow these general guidelines:  Choose foods with a percent daily value for sodium of less than 5% (as listed on the food label).  Use salt-free seasonings or herbs instead of table salt or sea salt.  Check with your health care provider or pharmacist before using salt substitutes.  Eat lower-sodium products, often labeled as "lower sodium" or "no salt added."  Eat fresh foods.  Eat more vegetables, fruits, and low-fat dairy products.  Choose whole grains. Look for the word "whole" as the first word in the ingredient list.  Choose fish and skinless chicken or turkey more often than   red meat. Limit fish, poultry, and meat to 6 oz (170 g) each day.  Limit sweets, desserts, sugars, and sugary drinks.  Choose heart-healthy fats.  Limit cheese to 1 oz (28 g) per day.  Eat more home-cooked food and less restaurant, buffet, and fast food.  Limit fried foods.  Cook foods using methods other than frying.  Limit canned vegetables. If you do use them, rinse them well to decrease the sodium.  When eating at a restaurant, ask that your food be prepared with less salt, or no salt if possible. WHAT FOODS CAN I EAT? Seek help from a dietitian for individual calorie needs. Grains Whole grain or whole wheat bread. Brown rice. Whole grain or whole wheat pasta. Quinoa, bulgur, and whole grain cereals. Low-sodium cereals. Corn or whole wheat flour tortillas. Whole grain cornbread. Whole grain crackers. Low-sodium crackers. Vegetables Fresh or frozen vegetables (raw, steamed, roasted, or grilled). Low-sodium or reduced-sodium tomato and vegetable juices. Low-sodium or reduced-sodium tomato sauce and paste. Low-sodium or  reduced-sodium canned vegetables.  Fruits All fresh, canned (in natural juice), or frozen fruits. Meat and Other Protein Products Ground beef (85% or leaner), grass-fed beef, or beef trimmed of fat. Skinless chicken or turkey. Ground chicken or turkey. Pork trimmed of fat. All fish and seafood. Eggs. Dried beans, peas, or lentils. Unsalted nuts and seeds. Unsalted canned beans. Dairy Low-fat dairy products, such as skim or 1% milk, 2% or reduced-fat cheeses, low-fat ricotta or cottage cheese, or plain low-fat yogurt. Low-sodium or reduced-sodium cheeses. Fats and Oils Tub margarines without trans fats. Light or reduced-fat mayonnaise and salad dressings (reduced sodium). Avocado. Safflower, olive, or canola oils. Natural peanut or almond butter. Other Unsalted popcorn and pretzels. The items listed above may not be a complete list of recommended foods or beverages. Contact your dietitian for more options. WHAT FOODS ARE NOT RECOMMENDED? Grains White bread. White pasta. White rice. Refined cornbread. Bagels and croissants. Crackers that contain trans fat. Vegetables Creamed or fried vegetables. Vegetables in a cheese sauce. Regular canned vegetables. Regular canned tomato sauce and paste. Regular tomato and vegetable juices. Fruits Dried fruits. Canned fruit in light or heavy syrup. Fruit juice. Meat and Other Protein Products Fatty cuts of meat. Ribs, chicken wings, bacon, sausage, bologna, salami, chitterlings, fatback, hot dogs, bratwurst, and packaged luncheon meats. Salted nuts and seeds. Canned beans with salt. Dairy Whole or 2% milk, cream, half-and-half, and cream cheese. Whole-fat or sweetened yogurt. Full-fat cheeses or blue cheese. Nondairy creamers and whipped toppings. Processed cheese, cheese spreads, or cheese curds. Condiments Onion and garlic salt, seasoned salt, table salt, and sea salt. Canned and packaged gravies. Worcestershire sauce. Tartar sauce. Barbecue sauce. Teriyaki  sauce. Soy sauce, including reduced sodium. Steak sauce. Fish sauce. Oyster sauce. Cocktail sauce. Horseradish. Ketchup and mustard. Meat flavorings and tenderizers. Bouillon cubes. Hot sauce. Tabasco sauce. Marinades. Taco seasonings. Relishes. Fats and Oils Butter, stick margarine, lard, shortening, ghee, and bacon fat. Coconut, palm kernel, or palm oils. Regular salad dressings. Other Pickles and olives. Salted popcorn and pretzels. The items listed above may not be a complete list of foods and beverages to avoid. Contact your dietitian for more information. WHERE CAN I FIND MORE INFORMATION? National Heart, [employer], and [employer] Institute: www.nhlbi.nih.gov/health/health-topics/topics/dash/ Document Released: 06/25/2011 Document Revised: 11/20/2013 Document Reviewed: 05/10/2013 ExitCare Patient Information 2015 [employer]. This information is not intended to replace advice given to you by your health care provider. Make sure you discuss any questions you have with your health care   provider. Diabetes Mellitus and Food It is important for you to manage your blood sugar (glucose) level. Your blood glucose level can be greatly affected by what you eat. Eating healthier foods in the appropriate amounts throughout the day at about the same time each day will help you control your blood glucose level. It can also help slow or prevent worsening of your diabetes mellitus. Healthy eating may even help you improve the level of your blood pressure and reach or maintain a healthy weight.  HOW CAN FOOD AFFECT ME? Carbohydrates Carbohydrates affect your blood glucose level more than any other type of food. Your dietitian will help you determine how many carbohydrates to eat at each meal and teach you how to count carbohydrates. Counting carbohydrates is important to keep your blood glucose at a healthy level, especially if you are using insulin or taking certain medicines for diabetes mellitus. Alcohol Alcohol  can cause sudden decreases in blood glucose (hypoglycemia), especially if you use insulin or take certain medicines for diabetes mellitus. Hypoglycemia can be a life-threatening condition. Symptoms of hypoglycemia (sleepiness, dizziness, and disorientation) are similar to symptoms of having too much alcohol.  If your health care provider has given you approval to drink alcohol, do so in moderation and use the following guidelines:  Women should not have more than one drink per day, and men should not have more than two drinks per day. One drink is equal to:  12 oz of beer.  5 oz of wine.  1 oz of hard liquor.  Do not drink on an empty stomach.  Keep yourself hydrated. Have water, diet soda, or unsweetened iced tea.  Regular soda, juice, and other mixers might contain a lot of carbohydrates and should be counted. WHAT FOODS ARE NOT RECOMMENDED? As you make food choices, it is important to remember that all foods are not the same. Some foods have fewer nutrients per serving than other foods, even though they might have the same number of calories or carbohydrates. It is difficult to get your body what it needs when you eat foods with fewer nutrients. Examples of foods that you should avoid that are high in calories and carbohydrates but low in nutrients include:  Trans fats (most processed foods list trans fats on the Nutrition Facts label).  Regular soda.  Juice.  Candy.  Sweets, such as cake, pie, doughnuts, and cookies.  Fried foods. WHAT FOODS CAN I EAT? Have nutrient-rich foods, which will nourish your body and keep you healthy. The food you should eat also will depend on several factors, including:  The calories you need.  The medicines you take.  Your weight.  Your blood glucose level.  Your blood pressure level.  Your cholesterol level. You also should eat a variety of foods, including:  Protein, such as meat, poultry, fish, tofu, nuts, and seeds (lean animal  proteins are best).  Fruits.  Vegetables.  Dairy products, such as milk, cheese, and yogurt (low fat is best).  Breads, grains, pasta, cereal, rice, and beans.  Fats such as olive oil, trans fat-free margarine, canola oil, avocado, and olives. DOES EVERYONE WITH DIABETES MELLITUS HAVE THE SAME MEAL PLAN? Because every person with diabetes mellitus is different, there is not one meal plan that works for everyone. It is very important that you meet with a dietitian who will help you create a meal plan that is just right for you. Document Released: 04/02/2005 Document Revised: 07/11/2013 Document Reviewed: 06/02/2013 ExitCare Patient Information 2015 [employer]   [employer]. This information is not intended to replace advice given to you by your health care provider. Make sure you discuss any questions you have with your health care provider.  

## 2015-01-04 NOTE — Progress Notes (Signed)
MRN: [id number] Name: [name] [name]  Sex: female Age: 81 y.o. DOB: [birth year]  Allergies: Sulfonamide derivatives  Chief Complaint  Patient presents with  . Follow-up    HPI: Patient is 81 y.o. female who has to of diabetes hypertension hyperlipidemia hypothyroidism comes today for followup as per patient she is compliant in taking her medication, on the last visit her Levemir dose was increased  and her hemoglobin A1c is trending down, occasionally her blood sugar drops less than 70s, as per patient she was seen by her endocrinologist and was advised to continue with the current medications,she has been advised about hypoglycemic symptoms, patient is also going to see a dietitian,currently she denies any headache dizziness chest and shortness of breath,she's requesting refill on her medication, previous blood work reviewed with the patient.  Past Medical History  Diagnosis Date  . Thyroid disease   . OAB (overactive bladder)     Past Surgical History  Procedure Laterality Date  . Abdominal hysterectomy    . Knee arthroscopy        Medication List       This list is accurate as of: 01/04/15 10:35 AM.  Always use your most recent med list.               alendronate 70 MG tablet  Commonly known as:  FOSAMAX  Take 70 mg by mouth every 7 (seven) days.     aspirin EC 81 MG tablet  Take 81 mg by mouth daily.     cholecalciferol 1000 UNITS tablet  Commonly known as:  VITAMIN D  Take 1,000 Units by mouth daily.     cyclobenzaprine 5 MG tablet  Commonly known as:  FLEXERIL  Take 5 mg by mouth 3 (three) times daily as needed for muscle spasms.     Fish Oil 1000 MG Caps  Take 1 capsule by mouth daily.     fluconazole 150 MG tablet  Commonly known as:  DIFLUCAN  Take one today and repeat in 3 days if needed     furosemide 20 MG tablet  Commonly known as:  LASIX  Take 1 tablet by mouth 3 (three) times daily. Monday, Wednesday, and Friday     insulin aspart 100  UNIT/ML injection  Commonly known as:  novoLOG  Inject 7 Units into the skin 3 (three) times daily before meals.     Insulin Detemir 100 UNIT/ML Pen  Commonly known as:  LEVEMIR FLEXTOUCH  Inject 7 Units into the skin 2 (two) times daily.     iron polysaccharides 150 MG capsule  Commonly known as:  NIFEREX  Take 150 mg by mouth 2 (two) times daily.     levothyroxine 100 MCG tablet  Commonly known as:  SYNTHROID, LEVOTHROID  Take 1 tablet (100 mcg total) by mouth daily.     loratadine 10 MG tablet  Commonly known as:  CLARITIN  Take 10 mg by mouth daily.     losartan 25 MG tablet  Commonly known as:  COZAAR  Take 1 tablet (25 mg total) by mouth daily.     meclizine 12.5 MG tablet  Commonly known as:  ANTIVERT  Take 1 tablet (12.5 mg total) by mouth 3 (three) times daily as needed for dizziness.     metFORMIN 1000 MG tablet  Commonly known as:  GLUCOPHAGE  Take 1,000 mg by mouth 2 (two) times daily with a meal.     mometasone 50 MCG/ACT nasal spray  Commonly  known as:  NASONEX  Place 2 sprays into the nose daily.     NONFORMULARY OR COMPOUNDED ITEM  Estradiol 0.02 % 52ml prefilled applicator Sig: apply twice a week     nystatin 100000 UNIT/ML suspension  Commonly known as:  MYCOSTATIN  Take 5 mLs by mouth daily as needed. For mouth     pantoprazole 40 MG tablet  Commonly known as:  PROTONIX  Take 1 tablet (40 mg total) by mouth daily.     simvastatin 40 MG tablet  Commonly known as:  ZOCOR  Take 1 tablet (40 mg total) by mouth every evening.     sucralfate 1 G tablet  Commonly known as:  CARAFATE  Take 1 tablet (1 g total) by mouth 4 (four) times daily -  with meals and at bedtime.     traMADol 50 MG tablet  Commonly known as:  ULTRAM  Take 1 tablet (50 mg total) by mouth every 6 (six) hours as needed for pain.        Meds ordered this encounter  Medications  . simvastatin (ZOCOR) 40 MG tablet    Sig: Take 1 tablet (40 mg total) by mouth every evening.     Dispense:  30 tablet    Refill:  4    Immunization History  Administered Date(s) Administered  . Tdap 11/03/2013    Family History  Problem Relation Age of Onset  . Diabetes Mother   . Hypertension Mother   . Diabetes Sister   . Diabetes Brother     History  Substance Use Topics  . Smoking status: Never Smoker   . Smokeless tobacco: Never Used  . Alcohol Use: No    Review of Systems   As noted in HPI  Filed Vitals:   01/04/15 1030  BP: 130/70  Pulse:   Temp:   Resp:     Physical Exam  Physical Exam  Constitutional: No distress.  Eyes: EOM are normal. Pupils are equal, round, and reactive to light.  Cardiovascular: Normal rate and regular rhythm.   Pulmonary/Chest: Breath sounds normal. No respiratory distress. She has no wheezes. She has no rales.  Musculoskeletal: She exhibits no edema.    CBC    Component Value Date/Time   WBC 8.3 03/28/2013 0833   WBC 5.6 10/10/2008 1305   RBC 3.88 03/28/2013 0833   RBC 3.71 10/10/2008 1305   HGB 11.2* 11/13/2014 1359   HGB 11.2* 10/10/2008 1305   HCT 33.0* 11/13/2014 1359   HCT 33.3* 10/10/2008 1305   PLT 266 03/28/2013 0833   PLT 381 10/10/2008 1305   MCV 88.4 03/28/2013 0833   MCV 89.6 10/10/2008 1305   LYMPHSABS 1.8 03/28/2013 0833   LYMPHSABS 0.9 10/10/2008 1305   MONOABS 0.5 03/28/2013 0833   MONOABS 0.3 10/10/2008 1305   EOSABS 0.1 03/28/2013 0833   EOSABS 0.0 10/10/2008 1305   BASOSABS 0.0 03/28/2013 0833   BASOSABS 0.0 10/10/2008 1305    CMP     Component Value Date/Time   NA 132* 11/13/2014 1359   K 4.7 11/13/2014 1359   CL 96 11/13/2014 1359   CO2 29 09/25/2014 1654   GLUCOSE 293* 11/13/2014 1359   BUN 30* 11/13/2014 1359   CREATININE 1.10 11/13/2014 1359   CREATININE 0.98 09/25/2014 1654   CALCIUM 9.8 09/25/2014 1654   PROT 6.8 09/25/2014 1654   ALBUMIN 4.2 09/25/2014 1654   AST 27 09/25/2014 1654   ALT 25 09/25/2014 1654   ALKPHOS 87 09/25/2014 1654  BILITOT 0.3 09/25/2014 1654    GFRNONAA 55* 09/25/2014 1654   GFRNONAA 47* 03/28/2013 0833   GFRAA 64 09/25/2014 1654   GFRAA 54* 03/28/2013 0833    Lab Results  Component Value Date/Time   CHOL 187 02/02/2008 09:59 AM    Lab Results  Component Value Date/Time   HGBA1C 8.20 01/04/2015 10:00 AM   HGBA1C 10.9* 02/02/2008 09:59 AM    Lab Results  Component Value Date/Time   AST 27 09/25/2014 04:54 PM    Assessment and Plan  Other specified diabetes mellitus without complications - Plan:  Results for orders placed or performed in visit on 01/04/15  Glucose (CBG)  Result Value Ref Range   POC Glucose 191.0 (A) 70 - 99 mg/dl  HgB A1c  Result Value Ref Range   Hemoglobin A1C 8.20    Hemoglobin A1c is trending down, continued current meds, patient is also following up with endocrinologist.  Essential hypertension Blood pressure is well controlled continue with DASH diet and current meds  Hyperlipidemia - Plan:Currently patient is on simvastatin (ZOCOR) 40 MG tablet,recheck fasting lipid panel on the next visit  Other specified hypothyroidism Patient is currently on levothyroxine 100 mcg daily, last TSH was normal, recheck on the following visit.    Return in about 3 months (around 04/06/2015), or if symptoms worsen or fail to improve.   This note has been created with Surveyor, quantity. Any transcriptional errors are unintentional.    Lorayne Marek, MD

## 2015-01-04 NOTE — Progress Notes (Signed)
Patient here for follow up on her diabetes and routine check up Patient also requesting refill on her medication

## 2015-01-08 ENCOUNTER — Ambulatory Visit (INDEPENDENT_AMBULATORY_CARE_PROVIDER_SITE_OTHER): Payer: Medicare HMO

## 2015-01-08 ENCOUNTER — Other Ambulatory Visit: Payer: Self-pay | Admitting: Gynecology

## 2015-01-08 DIAGNOSIS — M8580 Other specified disorders of bone density and structure, unspecified site: Secondary | ICD-10-CM

## 2015-01-08 DIAGNOSIS — M810 Age-related osteoporosis without current pathological fracture: Secondary | ICD-10-CM

## 2015-01-09 ENCOUNTER — Encounter: Payer: Self-pay | Admitting: Gynecology

## 2015-01-10 ENCOUNTER — Other Ambulatory Visit: Payer: Self-pay | Admitting: Gynecology

## 2015-01-10 DIAGNOSIS — M810 Age-related osteoporosis without current pathological fracture: Secondary | ICD-10-CM

## 2015-01-14 ENCOUNTER — Other Ambulatory Visit: Payer: Medicare HMO

## 2015-01-14 DIAGNOSIS — M810 Age-related osteoporosis without current pathological fracture: Secondary | ICD-10-CM

## 2015-01-15 LAB — PTH, INTACT AND CALCIUM
Calcium: 9.5 mg/dL (ref 8.4–10.5)
PTH: 60 pg/mL (ref 14–64)

## 2015-01-15 LAB — VITAMIN D 25 HYDROXY (VIT D DEFICIENCY, FRACTURES): VIT D 25 HYDROXY: 46 ng/mL (ref 30–100)

## 2015-02-19 ENCOUNTER — Encounter: Payer: Self-pay | Admitting: *Deleted

## 2015-02-19 ENCOUNTER — Encounter: Payer: Medicare HMO | Attending: Internal Medicine | Admitting: *Deleted

## 2015-02-19 VITALS — Ht 67.0 in | Wt 144.2 lb

## 2015-02-19 DIAGNOSIS — Z713 Dietary counseling and surveillance: Secondary | ICD-10-CM | POA: Insufficient documentation

## 2015-02-19 DIAGNOSIS — E119 Type 2 diabetes mellitus without complications: Secondary | ICD-10-CM

## 2015-02-19 DIAGNOSIS — Z794 Long term (current) use of insulin: Secondary | ICD-10-CM | POA: Insufficient documentation

## 2015-02-19 NOTE — Progress Notes (Signed)
Diabetes Self-Management Education  Visit Type: First/Initial (DX 1995)  Appt. Start Time: 0800 Appt. End Time: 0930  02/19/2015  [name], identified by name and date of birth, is a 81 y.o. female with a diagnosis of Diabetes: Type 2.  Other people present during visit:  Patient, Family Member (Daughter [name] [name]) , [name] [name] has a #17 yr history of T2DM. Has has attended both group classes and individual sessions with a dietitian. She returns today with her daughter for further nutritional guidance.   ASSESSMENT  Height 5\' 7"  (1.702 m), weight 144 lb 3.2 oz (65.409 kg). Body mass index is 22.58 kg/(m^2).  Initial Visit Information:  Are you currently following a meal plan?: Yes [facility]) What type of meal plan do you follow?: none Are you taking your medications as prescribed?: Yes Are you checking your feet?: Yes How many days per week are you checking your feet?: 7 How often do you need to have someone help you when you read instructions, pamphlets, or other written materials from your doctor or pharmacy?: 1 - Never   Psychosocial:   Patient Belief/Attitude about Diabetes: Motivated to manage diabetes Self-care barriers: None Self-management support: Doctor's office, Family, CDE visits Other persons present: Patient, Family Member (Daughter [name] [name]) Patient Concerns: Nutrition/Meal planning, Medication, Monitoring, Healthy Lifestyle, Glycemic Control Special Needs: Instruct caregiver (Instruct family in addition to patient) Preferred Learning Style: No preference indicated Learning Readiness: Change in progress  Complications:   Last HgB A1C per patient/outside source: 9.4 mg/dL How often do you check your blood sugar?: 3-4 times/day Have you had a dilated eye exam in the past 12 months?: Yes Have you had a dental exam in the past 12 months?: No (full denture)  Diet Intake:  Breakfast: oatmeal 1C, toast,water, coffee sweet n low, regular coffee creamer  / egg, bacon, toast / bread with peanutbutter, 1/2 orange / salmon crouquette & toast Snack (morning): fruit Lunch: baked chicken, cabbage, corn bread 2"sq.,   /  lunch meat, lettuce, tomato sandwich /  Snack (afternoon): fruit (4-5 grapes) / yogurt (Dannon Light & Fit greek) / Ritz crackers #4  Dinner: vegetable, brown, rice, chicken /  Beverage(s): coffee, water,  cranberry Pomegranate Joint Juice  Exercise:  Exercise: Light (walking / raking leaves) Light Exercise amount of time (min / week): 135  Individualized Plan for Diabetes Self-Management Training:   Learning Objective:  Patient will have a greater understanding of diabetes self-management. Patient education plan per assessed needs and concerns is to attend individual sessions      Education Topics Reviewed with Patient Today:  Factors that contribute to the development of diabetes Role of diet in the treatment of diabetes and the relationship between the three main macronutrients and blood glucose level, Meal options for control of blood glucose level and chronic complications. Role of exercise on diabetes management, blood pressure control and cardiac health. Reviewed patients medication for diabetes, action, purpose, timing of dose and side effects. Taught/evaluated SMBG meter. Relationship between chronic complications and blood glucose control, Assessed and discussed foot care and prevention of foot problems, Dental care, Retinopathy and reason for yearly dilated eye exams  PATIENTS GOALS/Plan (Developed by the patient):  Nutrition: General guidelines for healthy choices and portions discussed Physical Activity: Exercise 5-7 days per week, 30 minutes per day Reducing Risk: do foot checks daily, get labs drawn  Patient Instructions  Plan:   Include protein in moderation with your meals and snacks Consider  increasing your activity level  by walking for 30 minutes daily as tolerated Continue checking BG at alternate  times per day as directed by MD  Continue taking medication as directed by MD  Lennie Hummer & fit Greek Yogurt / Yoplait 100 Greek./...... carbs and protein together Olive Oil spray or PAM Cottage Cheese & fruit......carbs and protein together Bread & peanut butter......... carbs and protein together Add nuts or cheese (Babybell light cheese) ( light string cheese)   Have a snack before bed  Carb & protein  Log all your sugars in your book!!!!   Expected Outcomes:  Demonstrated interest in learning. Expect positive outcomes  Education material provided: Living Well with Diabetes, A1C conversion sheet, Meal plan card, My Plate, Snack sheet and Support group flyer  If problems or questions, patient to contact team via:  Phone  Future DSME appointment: 4-6 wks

## 2015-02-19 NOTE — Patient Instructions (Signed)
Plan:   Include protein in moderation with your meals and snacks Consider  increasing your activity level by walking for 30 minutes daily as tolerated Continue checking BG at alternate times per day as directed by MD  Continue taking medication as directed by MD  Lennie Hummer & fit Greek Yogurt / Yoplait 100 Greek./...... carbs and protein together Olive Oil spray or PAM Cottage Cheese & fruit......carbs and protein together Bread & peanut butter......... carbs and protein together Add nuts or cheese (Babybell light cheese) ( light string cheese)   Have a snack before bed  Carb & protein  Log all your sugars in your book!!!!  Take your

## 2015-03-27 ENCOUNTER — Encounter: Payer: Self-pay | Admitting: *Deleted

## 2015-03-27 ENCOUNTER — Encounter: Payer: Medicare HMO | Attending: Internal Medicine | Admitting: *Deleted

## 2015-03-27 VITALS — Ht 67.0 in | Wt 148.0 lb

## 2015-03-27 DIAGNOSIS — Z794 Long term (current) use of insulin: Secondary | ICD-10-CM | POA: Diagnosis not present

## 2015-03-27 DIAGNOSIS — Z713 Dietary counseling and surveillance: Secondary | ICD-10-CM | POA: Insufficient documentation

## 2015-03-27 DIAGNOSIS — E119 Type 2 diabetes mellitus without complications: Secondary | ICD-10-CM | POA: Diagnosis present

## 2015-03-27 NOTE — Patient Instructions (Signed)
Your doing a great job with your food choices and portions......Marland Kitchen Keep up the good work Also great with your walking.... Keep it up  When testing sugar after your meal.... Wait for 2 hours and the first bite of your meal.... Then test sugar If you are not hungry you do not have to have your between meal snacks.

## 2015-04-02 NOTE — Progress Notes (Signed)
Diabetes Self-Management Education  Visit Type: Follow-up  Appt. Start Time: 0800 Appt. End Time: 0830  04/02/2015  [name], identified by name and date of birth, is a 81 y.o. female with a diagnosis of Diabetes: Type 2.   ASSESSMENT  Height 5\' 7"  (1.702 m), weight 148 lb (67.132 kg). Body mass index is 23.17 kg/(m^2).      Diabetes Self-Management Education - 03/27/15 0813    Visit Information   Visit Type Follow-up   Initial Visit   Diabetes Type Type 2   Are you taking your medications as prescribed? Yes   Psychosocial Assessment   Patient Belief/Attitude about Diabetes Motivated to manage diabetes   Self-care barriers None   Self-management support Doctor's office;CDE visits   Other persons present Patient;Family Member  Daughter [name]   Patient Concerns Nutrition/Meal planning;Glycemic Control   Special Needs None   Preferred Learning Style No preference indicated   Learning Readiness Change in progress   Complications   Last HgB A1C per patient/outside source 8.2 %   How often do you check your blood sugar? 3-4 times/day   Fasting Blood glucose range (mg/dL) --  range 58-200   Postprandial Blood glucose range (mg/dL) --  Range 58-287   Dietary Intake   Breakfast small orange, 1 piece toast with peanut butter, oatmeal & peanut butter   Snack (morning) Dannon Light & fit [place] Yogurt   Lunch leftovers   Dinner greens, chicken, sweet potato w/sour cream  / greens, corn bread, meat   Beverage(s) water, coffee,    Exercise   Exercise Type Light (walking / raking leaves)   How many days per week to you exercise? 5   How many minutes per day do you exercise? 45   Total minutes per week of exercise 225   Patient Education   Previous Diabetes Education Yes (please comment)  Here 6 weeks ago   Disease state  Explored patient's options for treatment of their diabetes   Nutrition management  Role of diet in the treatment of diabetes and the relationship  between the three main macronutrients and blood glucose level   Physical activity and exercise  Role of exercise on diabetes management, blood pressure control and cardiac health.;Helped patient identify appropriate exercises in relation to his/her diabetes, diabetes complications and other health issue.   Medications Reviewed patients medication for diabetes, action, purpose, timing of dose and side effects.   Monitoring Purpose and frequency of SMBG.;Identified appropriate SMBG and/or A1C goals.   Acute complications Taught treatment of hypoglycemia - the 15 rule.   Chronic complications Relationship between chronic complications and blood glucose control;Identified and discussed with patient  current chronic complications;Reviewed with patient heart disease, higher risk of, and prevention;Retinopathy and reason for yearly dilated eye exams   Psychosocial adjustment Role of stress on diabetes   Individualized Goals (developed by patient)   Nutrition General guidelines for healthy choices and portions discussed   Physical Activity Exercise 3-5 times per week;30 minutes per day   Medications take my medication as prescribed   Monitoring  test my blood glucose as discussed   Patient Self-Evaluation of Goals - Patient rates self as meeting previously set goals (% of time)   Nutrition >75%   Physical Activity >75%   Medications >75%   Monitoring >75%   Outcomes   Expected Outcomes Demonstrated interest in learning. Expect positive outcomes   Future DMSE PRN   Program Status Completed   Subsequent Visit   Since your  last visit have you continued or begun to take your medications as prescribed? Yes      Individualized Plan for Diabetes Self-Management Training:   Learning Objective:  Patient will have a greater understanding of diabetes self-management. Patient education plan is to attend individual and/or group sessions per assessed needs and concerns.   Plan:   Patient Instructions   Your doing a great job with your food choices and portions......Marland Kitchen Keep up the good work Also great with your walking.... Keep it up  When testing sugar after your meal.... Wait for 2 hours and the first bite of your meal.... Then test sugar If you are not hungry you do not have to have your between meal snacks.     Expected Outcomes:  Demonstrated interest in learning. Expect positive outcomes  If problems or questions, patient to contact team via:  Phone  Future DSME appointment: PRN

## 2015-05-06 ENCOUNTER — Ambulatory Visit: Payer: Medicare HMO | Attending: Family Medicine | Admitting: Family Medicine

## 2015-05-06 ENCOUNTER — Encounter: Payer: Self-pay | Admitting: Family Medicine

## 2015-05-06 VITALS — BP 125/57 | HR 61 | Temp 98.5°F | Resp 16 | Ht 67.0 in | Wt 145.0 lb

## 2015-05-06 DIAGNOSIS — Z7984 Long term (current) use of oral hypoglycemic drugs: Secondary | ICD-10-CM | POA: Insufficient documentation

## 2015-05-06 DIAGNOSIS — Z794 Long term (current) use of insulin: Secondary | ICD-10-CM | POA: Insufficient documentation

## 2015-05-06 DIAGNOSIS — Z23 Encounter for immunization: Secondary | ICD-10-CM | POA: Diagnosis not present

## 2015-05-06 DIAGNOSIS — Z7982 Long term (current) use of aspirin: Secondary | ICD-10-CM | POA: Diagnosis not present

## 2015-05-06 DIAGNOSIS — M25551 Pain in right hip: Secondary | ICD-10-CM | POA: Insufficient documentation

## 2015-05-06 DIAGNOSIS — Z79899 Other long term (current) drug therapy: Secondary | ICD-10-CM | POA: Diagnosis not present

## 2015-05-06 DIAGNOSIS — E119 Type 2 diabetes mellitus without complications: Secondary | ICD-10-CM

## 2015-05-06 DIAGNOSIS — E1165 Type 2 diabetes mellitus with hyperglycemia: Secondary | ICD-10-CM | POA: Diagnosis not present

## 2015-05-06 LAB — GLUCOSE, POCT (MANUAL RESULT ENTRY): POC Glucose: 318 mg/dl — AB (ref 70–99)

## 2015-05-06 LAB — POCT URINALYSIS DIPSTICK
Bilirubin, UA: NEGATIVE
Blood, UA: NEGATIVE
GLUCOSE UA: 500
Ketones, UA: NEGATIVE
Leukocytes, UA: NEGATIVE
Nitrite, UA: NEGATIVE
Protein, UA: NEGATIVE
SPEC GRAV UA: 1.01
UROBILINOGEN UA: 1
pH, UA: 7

## 2015-05-06 LAB — POCT GLYCOSYLATED HEMOGLOBIN (HGB A1C): Hemoglobin A1C: 7.6

## 2015-05-06 MED ORDER — INSULIN DETEMIR 100 UNIT/ML FLEXPEN: 8.0000 [IU] | PEN_INJECTOR | Freq: Two times a day (BID) | SUBCUTANEOUS | Status: DC

## 2015-05-06 MED ORDER — INSULIN ASPART 100 UNIT/ML ~~LOC~~ SOLN: 6.0000 [IU] | Freq: Three times a day (TID) | SUBCUTANEOUS | Status: DC

## 2015-05-06 MED ORDER — NAPROXEN SODIUM 220 MG PO TABS: 220.0000 mg | ORAL_TABLET | Freq: Two times a day (BID) | ORAL | Status: DC

## 2015-05-06 NOTE — Assessment & Plan Note (Signed)
A; R hip pain suspect OA and trochanteric bursitis P: X-ray Naproxen Home therapy Exercise-yoga and water aerobics

## 2015-05-06 NOTE — Addendum Note (Signed)
Addended by: Boykin Nearing on: 05/06/2015 05:49 PM   Modules accepted: Orders

## 2015-05-06 NOTE — Progress Notes (Signed)
F/U DM  Glucose been running 100 Elevated glucose today at OV  Stated took Novalog 7.5 unit at 12:00pm Lunch at 12:15 Stated taking Levemir 8 unit  bid  Complaining of rt hip pain worsen in the morning  No pain today

## 2015-05-06 NOTE — Progress Notes (Signed)
Subjective:  Patient ID: [name] [name], female    DOB: August 11, 1933  Age: 81 y.o. MRN: [id number]  CC: Diabetes   HPI GREER WAINRIGHT presents for    1. CHRONIC DIABETES followed by Endocrinologist Dr. Buddy Duty   Disease Monitoring  Blood Sugar Ranges: 50-75 in AM   Polyuria: no   Visual problems: no   Medication Compliance: yes  Medication Side Effects  Hypoglycemia: yes, down to [place] Exam: done every march   Foot Exam: done today     2. R hip pain: x one year. Fall two weeks ago. Pain is R lateral hip radiating to knee and R medial hip. Patient walks for exercise. Has tried ibuprofen for pain w/o relief.   3. Itchy scalp: for months. No lesions. Some flaking. Has tried multiple OTC shampoos.   Social History  Substance Use Topics  . Smoking status: Never Smoker   . Smokeless tobacco: Never Used  . Alcohol Use: No    Outpatient Prescriptions Prior to Visit  Medication Sig Dispense Refill  . alendronate (FOSAMAX) 70 MG tablet Take 70 mg by mouth every 7 (seven) days.     Marland Kitchen aspirin EC 81 MG tablet Take 81 mg by mouth daily.    . cholecalciferol (VITAMIN D) 1000 UNITS tablet Take 1,000 Units by mouth daily.    . cyclobenzaprine (FLEXERIL) 5 MG tablet Take 5 mg by mouth 3 (three) times daily as needed for muscle spasms.    . fluconazole (DIFLUCAN) 150 MG tablet Take one today and repeat in 3 days if needed 2 tablet 1  . furosemide (LASIX) 20 MG tablet Take 1 tablet by mouth 3 (three) times daily. Monday, Wednesday, and Friday  6  . insulin aspart (NOVOLOG) 100 UNIT/ML injection Inject 7 Units into the skin 3 (three) times daily before meals.     . Insulin Detemir (LEVEMIR FLEXTOUCH) 100 UNIT/ML Pen Inject 7 Units into the skin 2 (two) times daily. (Patient taking differently: Inject 8 Units into the skin 2 (two) times daily. ) 15 mL 11  . iron polysaccharides (NIFEREX) 150 MG capsule Take 150 mg by mouth 2 (two) times daily.     Marland Kitchen levothyroxine  (SYNTHROID, LEVOTHROID) 100 MCG tablet Take 1 tablet (100 mcg total) by mouth daily. 30 tablet 3  . loratadine (CLARITIN) 10 MG tablet Take 10 mg by mouth daily.    Marland Kitchen losartan (COZAAR) 25 MG tablet Take 1 tablet (25 mg total) by mouth daily. 30 tablet 3  . meclizine (ANTIVERT) 12.5 MG tablet Take 1 tablet (12.5 mg total) by mouth 3 (three) times daily as needed for dizziness. 30 tablet 0  . metFORMIN (GLUCOPHAGE) 1000 MG tablet Take 1,000 mg by mouth 2 (two) times daily with a meal.    . mometasone (NASONEX) 50 MCG/ACT nasal spray Place 2 sprays into the nose daily.    . NONFORMULARY OR COMPOUNDED ITEM Estradiol 0.02 % 85ml prefilled applicator Sig: apply twice a week 24 each 4  . nystatin (MYCOSTATIN) 100000 UNIT/ML suspension Take 5 mLs by mouth daily as needed. For mouth    . Omega-3 Fatty Acids (FISH OIL) 1000 MG CAPS Take 1 capsule by mouth daily.     . pantoprazole (PROTONIX) 40 MG tablet Take 1 tablet (40 mg total) by mouth daily. 30 tablet 3  . simvastatin (ZOCOR) 40 MG tablet Take 1 tablet (40 mg total) by mouth every evening. 30 tablet 4  . sucralfate (  CARAFATE) 1 G tablet Take 1 tablet (1 g total) by mouth 4 (four) times daily -  with meals and at bedtime. 30 tablet 2  . traMADol (ULTRAM) 50 MG tablet Take 1 tablet (50 mg total) by mouth every 6 (six) hours as needed for pain. 20 tablet 0   No facility-administered medications prior to visit.    ROS Review of Systems  Constitutional: Negative for fever and chills.  Eyes: Negative for visual disturbance.  Respiratory: Negative for shortness of breath.   Cardiovascular: Negative for chest pain.  Gastrointestinal: Negative for abdominal pain and blood in stool.  Musculoskeletal: Negative for back pain and arthralgias.  Skin: Negative for rash.  Allergic/Immunologic: Negative for immunocompromised state.  Hematological: Negative for adenopathy. Does not bruise/bleed easily.  Psychiatric/Behavioral: Negative for suicidal ideas and  dysphoric mood.    Objective:  BP 125/57 mmHg  Pulse 61  Temp(Src) 98.5 F (36.9 C) (Oral)  Resp 16  Ht 5\' 7"  (1.702 m)  Wt 145 lb (65.772 kg)  BMI 22.71 kg/m2  SpO2 98%  BP/Weight 05/06/2015 07/25/567 01/26/4800  Systolic BP 655 - -  Diastolic BP 57 - -  Wt. (Lbs) 145 148 144.2  BMI 22.71 23.17 22.58   Physical Exam  Constitutional: She is oriented to person, place, and time. She appears well-developed and well-nourished. No distress.  HENT:  Head: Normocephalic and atraumatic.  Cardiovascular: Normal rate, regular rhythm, normal heart sounds and intact distal pulses.   Pulmonary/Chest: Effort normal and breath sounds normal.  Musculoskeletal: She exhibits no edema.  Neurological: She is alert and oriented to person, place, and time.  Skin: Skin is warm and dry. No rash noted.  Psychiatric: She has a normal mood and affect.    Lab Results  Component Value Date   HGBA1C 8.20 01/04/2015   . Lab Results  Component Value Date   HGBA1C 7.60 05/06/2015    CBG 318  Assessment & Plan:   Problem List Items Addressed This Visit    Diabetes type 2, controlled ([place]) - Primary (Chronic)   Relevant Medications   Insulin Detemir (LEVEMIR FLEXTOUCH) 100 UNIT/ML Pen   insulin aspart (NOVOLOG) 100 UNIT/ML injection   Other Relevant Orders   HgB A1c (Completed)   Glucose (CBG) (Completed)   Microalbumin/Creatinine Ratio, Urine   POCT urinalysis dipstick (Completed)   Right hip pain   Relevant Medications   naproxen sodium (ALEVE) 220 MG tablet   Other Relevant Orders   DG HIPS BILAT WITH PELVIS 2V      No orders of the defined types were placed in this encounter.    Follow-up: No Follow-up on file.   Boykin Nearing MD

## 2015-05-06 NOTE — Patient Instructions (Addendum)
[name] [name] was seen today for diabetes.  Diagnoses and all orders for this visit:  Controlled type 2 diabetes mellitus without complication, without long-term current use of insulin (HCC) -     HgB A1c -     Glucose (CBG) -     Microalbumin/Creatinine Ratio, Urine -     POCT urinalysis dipstick -     Insulin Detemir (LEVEMIR FLEXTOUCH) 100 UNIT/ML Pen; Inject 8 Units into the skin 2 (two) times daily. -     insulin aspart (NOVOLOG) 100 UNIT/ML injection; Inject 6 Units into the skin 3 (three) times daily before meals.  Right hip pain -     DG HIPS BILAT WITH PELVIS 2V; Future -     naproxen sodium (ALEVE) 220 MG tablet; Take 1 tablet (220 mg total) by mouth 2 (two) times daily with a meal.  Other orders -     Pneumococcal polysaccharide vaccine 23-valent greater than or equal to 2yo subcutaneous/IM   Scalp itching: try selsum blue   I recommend exercise- yoga and water aerobics for R hip pain Aleve as needed for pain take with food  F/u in 6 weeks for R hip pain F/u in 4 months for diabetes   Dr. Adrian Blackwater   Trochanteric Bursitis You have hip pain due to trochanteric bursitis. Bursitis means that the sack near the outside of the hip is filled with fluid and inflamed. This sack is made up of protective soft tissue. The pain from trochanteric bursitis can be severe and keep you from sleep. It can radiate to the buttocks or down the outside of the thigh to the knee. The pain is almost always worse when rising from the seated or lying position and with walking. Pain can improve after you take a few steps. It happens more often in people with hip joint and lumbar spine problems, such as arthritis or previous surgery. Very rarely the trochanteric bursa can become infected, and antibiotics and/or surgery may be needed. Treatment often includes an injection of local anesthetic mixed with cortisone medicine. This medicine is injected into the area where it is most tender over the hip. Repeat  injections may be necessary if the response to treatment is slow. You can apply ice packs over the tender area for 30 minutes every 2 hours for the next few days. Anti-inflammatory and/or narcotic pain medicine may also be helpful. Limit your activity for the next few days if the pain continues. See your caregiver in 5-10 days if you are not greatly improved.  SEEK IMMEDIATE MEDICAL CARE IF:  You develop severe pain, fever, or increased redness.  You have pain that radiates below the knee. EXERCISES STRETCHING EXERCISES - Trochanteric Bursitis  These exercises may help you when beginning to rehabilitate your injury. Your symptoms may resolve with or without further involvement from your physician, physical therapist, or athletic trainer. While completing these exercises, remember:   Restoring tissue flexibility helps normal motion to return to the joints. This allows healthier, less painful movement and activity.  An effective stretch should be held for at least 30 seconds.  A stretch should never be painful. You should only feel a gentle lengthening or release in the stretched tissue. STRETCH - Iliotibial Band  On the floor or bed, lie on your side so your injured leg is on top. Bend your knee and grab your ankle.  Slowly bring your knee back so that your thigh is in line with your trunk. Keep your heel at your buttocks and  gently arch your back so your head, shoulders and hips line up.  Slowly lower your leg so that your knee approaches the floor/bed until you feel a gentle stretch on the outside of your thigh. If you do not feel a stretch and your knee will not fall farther, place the heel of your opposite foot on top of your knee and pull your thigh down farther.  Hold this stretch for __________ seconds.  Repeat __________ times. Complete this exercise __________ times per day. STRETCH - Hamstrings, Supine   Lie on your back. Loop a belt or towel over the ball of your foot as  shown.  Straighten your knee and slowly pull on the belt to raise your injured leg. Do not allow the knee to bend. Keep your opposite leg flat on the floor.  Raise the leg until you feel a gentle stretch behind your knee or thigh. Hold this position for __________ seconds.  Repeat __________ times. Complete this stretch __________ times per day. STRETCH - Quadriceps, Prone   Lie on your stomach on a firm surface, such as a bed or padded floor.  Bend your knee and grasp your ankle. If you are unable to reach your ankle or pant leg, use a belt around your foot to lengthen your reach.  Gently pull your heel toward your buttocks. Your knee should not slide out to the side. You should feel a stretch in the front of your thigh and/or knee.  Hold this position for __________ seconds.  Repeat __________ times. Complete this stretch __________ times per day. STRETCHING - Hip Flexors, Lunge Half kneel with your knee on the floor and your opposite knee bent and directly over your ankle.  Keep good posture with your head over your shoulders. Tighten your buttocks to point your tailbone downward; this will prevent your back from arching too much.  You should feel a gentle stretch in the front of your thigh and/or hip. If you do not feel any resistance, slightly slide your opposite foot forward and then slowly lunge forward so your knee once again lines up over your ankle. Be sure your tailbone remains pointed downward.  Hold this stretch for __________ seconds.  Repeat __________ times. Complete this stretch __________ times per day. STRETCH - Adductors, Lunge  While standing, spread your legs.  Lean away from your injured leg by bending your opposite knee. You may rest your hands on your thigh for balance.  You should feel a stretch in your inner thigh. Hold for __________ seconds.  Repeat __________ times. Complete this exercise __________ times per day.   This information is not intended  to replace advice given to you by your health care provider. Make sure you discuss any questions you have with your health care provider.   Document Released: 08/13/2004 Document Revised: 11/20/2014 Document Reviewed: 10/18/2008 Elsevier Interactive Patient Education [employer].

## 2015-05-06 NOTE — Assessment & Plan Note (Signed)
A; well controlled. Followed by Endocrinology P: Decrease novolog to 6 U TID due to low CBGs

## 2015-05-07 LAB — MICROALBUMIN / CREATININE URINE RATIO
Creatinine, Urine: 93 mg/dL (ref 20–320)
MICROALB UR: 1 mg/dL
MICROALB/CREAT RATIO: 11 ug/mg{creat} (ref <30)

## 2015-05-08 ENCOUNTER — Telehealth: Payer: Self-pay | Admitting: *Deleted

## 2015-05-08 NOTE — Telephone Encounter (Signed)
-----   Message from Boykin Nearing, MD sent at 05/07/2015  1:50 PM EDT ----- Normal urine micro

## 2015-05-08 NOTE — Telephone Encounter (Signed)
Date of birth verified by pt  Normal urine micro  Pt verbalized understanding

## 2015-05-14 DIAGNOSIS — E1140 Type 2 diabetes mellitus with diabetic neuropathy, unspecified: Secondary | ICD-10-CM | POA: Diagnosis not present

## 2015-05-14 DIAGNOSIS — E1165 Type 2 diabetes mellitus with hyperglycemia: Secondary | ICD-10-CM | POA: Diagnosis not present

## 2015-06-10 ENCOUNTER — Ambulatory Visit: Payer: Medicare HMO | Attending: Family Medicine | Admitting: Family Medicine

## 2015-06-10 ENCOUNTER — Encounter: Payer: Self-pay | Admitting: Family Medicine

## 2015-06-10 VITALS — BP 168/80 | HR 68 | Temp 98.6°F | Resp 16 | Ht 67.0 in | Wt 147.6 lb

## 2015-06-10 DIAGNOSIS — M810 Age-related osteoporosis without current pathological fracture: Secondary | ICD-10-CM | POA: Diagnosis not present

## 2015-06-10 DIAGNOSIS — Z7983 Long term (current) use of bisphosphonates: Secondary | ICD-10-CM | POA: Diagnosis not present

## 2015-06-10 DIAGNOSIS — E039 Hypothyroidism, unspecified: Secondary | ICD-10-CM | POA: Insufficient documentation

## 2015-06-10 DIAGNOSIS — J0120 Acute ethmoidal sinusitis, unspecified: Secondary | ICD-10-CM | POA: Diagnosis not present

## 2015-06-10 DIAGNOSIS — Z79899 Other long term (current) drug therapy: Secondary | ICD-10-CM | POA: Insufficient documentation

## 2015-06-10 DIAGNOSIS — Z7982 Long term (current) use of aspirin: Secondary | ICD-10-CM | POA: Diagnosis not present

## 2015-06-10 DIAGNOSIS — Z794 Long term (current) use of insulin: Secondary | ICD-10-CM | POA: Insufficient documentation

## 2015-06-10 DIAGNOSIS — E119 Type 2 diabetes mellitus without complications: Secondary | ICD-10-CM | POA: Diagnosis not present

## 2015-06-10 DIAGNOSIS — E785 Hyperlipidemia, unspecified: Secondary | ICD-10-CM | POA: Insufficient documentation

## 2015-06-10 DIAGNOSIS — I10 Essential (primary) hypertension: Secondary | ICD-10-CM | POA: Insufficient documentation

## 2015-06-10 DIAGNOSIS — Z791 Long term (current) use of non-steroidal anti-inflammatories (NSAID): Secondary | ICD-10-CM | POA: Insufficient documentation

## 2015-06-10 DIAGNOSIS — J329 Chronic sinusitis, unspecified: Secondary | ICD-10-CM | POA: Diagnosis not present

## 2015-06-10 DIAGNOSIS — K219 Gastro-esophageal reflux disease without esophagitis: Secondary | ICD-10-CM | POA: Diagnosis not present

## 2015-06-10 DIAGNOSIS — R05 Cough: Secondary | ICD-10-CM | POA: Diagnosis present

## 2015-06-10 LAB — GLUCOSE, POCT (MANUAL RESULT ENTRY): POC Glucose: 345 mg/dl — AB (ref 70–99)

## 2015-06-10 MED ORDER — AMOXICILLIN 500 MG PO CAPS: 500.0000 mg | ORAL_CAPSULE | Freq: Three times a day (TID) | ORAL | Status: DC

## 2015-06-10 NOTE — Progress Notes (Signed)
Subjective:    Patient ID: [name] [name], female    DOB: 09/29/1933, 81 y.o.   MRN: [id number]  HPI 81 year old female with a history of type 2 diabetes mellitus (A1c 7.6 from 05/06/15), GERD, hyperlipidemia, hypertension, hypothyroidism who comes in today for an acute visit.  She complains of head congestion, sinus pressure, dry cough, malaise , headaches for the last one week. She has also noticed some nausea and dizziness which is not related to change in position. Her bowel movements (about 2x/day) are now soft she says but denies frank diarrhea. Has been using OTC antihistamines with no relief in symptoms.  Past Medical History  Diagnosis Date  . Thyroid disease   . OAB (overactive bladder)   . Osteoporosis   . Diabetes mellitus without complication ([place])   . Hypertension   . GERD (gastroesophageal reflux disease)   . Hyperlipidemia     Past Surgical History  Procedure Laterality Date  . Abdominal hysterectomy    . Knee arthroscopy      Social History   Social History  . Marital Status: Widowed    Spouse Name: N/A  . Number of Children: N/A  . Years of Education: N/A   Occupational History  . Not on file.   Social History Main Topics  . Smoking status: Never Smoker   . Smokeless tobacco: Never Used  . Alcohol Use: No  . Drug Use: No  . Sexual Activity: Not on file   Other Topics Concern  . Not on file   Social History Narrative    Allergies  Allergen Reactions  . Sulfonamide Derivatives Hives    Current Outpatient Prescriptions on File Prior to Visit  Medication Sig Dispense Refill  . alendronate (FOSAMAX) 70 MG tablet Take 70 mg by mouth every 7 (seven) days.     Marland Kitchen aspirin EC 81 MG tablet Take 81 mg by mouth daily.    . cholecalciferol (VITAMIN D) 1000 UNITS tablet Take 1,000 Units by mouth daily.    . cyclobenzaprine (FLEXERIL) 5 MG tablet Take 5 mg by mouth 3 (three) times daily as needed for muscle spasms.    . furosemide (LASIX) 20 MG  tablet Take 1 tablet by mouth 3 (three) times daily. Monday, Wednesday, and Friday  6  . insulin aspart (NOVOLOG) 100 UNIT/ML injection Inject 6 Units into the skin 3 (three) times daily before meals. 10 mL 5  . Insulin Detemir (LEVEMIR FLEXTOUCH) 100 UNIT/ML Pen Inject 8 Units into the skin 2 (two) times daily. 15 mL 11  . iron polysaccharides (NIFEREX) 150 MG capsule Take 150 mg by mouth 2 (two) times daily.     Marland Kitchen levothyroxine (SYNTHROID, LEVOTHROID) 100 MCG tablet Take 1 tablet (100 mcg total) by mouth daily. 30 tablet 3  . loratadine (CLARITIN) 10 MG tablet Take 10 mg by mouth daily.    Marland Kitchen losartan (COZAAR) 25 MG tablet Take 1 tablet (25 mg total) by mouth daily. 30 tablet 3  . meclizine (ANTIVERT) 12.5 MG tablet Take 1 tablet (12.5 mg total) by mouth 3 (three) times daily as needed for dizziness. 30 tablet 0  . metFORMIN (GLUCOPHAGE) 1000 MG tablet Take 1,000 mg by mouth 2 (two) times daily with a meal.    . mometasone (NASONEX) 50 MCG/ACT nasal spray Place 2 sprays into the nose daily.    . naproxen sodium (ALEVE) 220 MG tablet Take 1 tablet (220 mg total) by mouth 2 (two) times daily with a meal. 30 tablet 1  .  NONFORMULARY OR COMPOUNDED ITEM Estradiol 0.02 % 44ml prefilled applicator Sig: apply twice a week 24 each 4  . nystatin (MYCOSTATIN) 100000 UNIT/ML suspension Take 5 mLs by mouth daily as needed. For mouth    . Omega-3 Fatty Acids (FISH OIL) 1000 MG CAPS Take 1 capsule by mouth daily.     . pantoprazole (PROTONIX) 40 MG tablet Take 1 tablet (40 mg total) by mouth daily. 30 tablet 3  . simvastatin (ZOCOR) 40 MG tablet Take 1 tablet (40 mg total) by mouth every evening. 30 tablet 4  . sucralfate (CARAFATE) 1 G tablet Take 1 tablet (1 g total) by mouth 4 (four) times daily -  with meals and at bedtime. 30 tablet 2  . traMADol (ULTRAM) 50 MG tablet Take 1 tablet (50 mg total) by mouth every 6 (six) hours as needed for pain. 20 tablet 0   No current facility-administered medications on  file prior to visit.    Review of Systems  Constitutional: Negative for activity change and appetite change.  HENT: Positive for postnasal drip, sinus pressure and sore throat.   Respiratory: Positive for cough (dry). Negative for chest tightness, shortness of breath and wheezing.   Gastrointestinal: Negative for abdominal pain, constipation and abdominal distention.  Genitourinary: Negative.   Musculoskeletal: Positive for myalgias.  Psychiatric/Behavioral: Negative for behavioral problems and dysphoric mood.       Objective: Filed Vitals:   06/10/15 1154  BP: 168/80  Pulse: 68  Temp: 98.6 F (37 C)  TempSrc: Oral  Resp: 16  Height: 5\' 7"  (1.702 m)  Weight: 147 lb 9.6 oz (66.951 kg)  SpO2: 99%      Physical Exam  Constitutional: She is oriented to person, place, and time. She appears well-developed and well-nourished.  HENT:  Right Ear: External ear normal.  Cerumen in ear canal, small amount fluid behind TM  Cardiovascular: Normal rate, normal heart sounds and intact distal pulses.   No murmur heard. Pulmonary/Chest: Effort normal and breath sounds normal. She has no wheezes. She has no rales. She exhibits no tenderness.  Abdominal: Soft. Bowel sounds are normal. She exhibits no distension and no mass. There is no tenderness.  Musculoskeletal: Normal range of motion.  Neurological: She is alert and oriented to person, place, and time.          Assessment & Plan:  Type 2 diabetes mellitus: Control is not fully optimized with A1c of 7.6.  Hypertension: BP is elevated likely from using OTC decongestants No regimen changes at this time  Sinusitis: Treated

## 2015-06-10 NOTE — Patient Instructions (Signed)

## 2015-06-10 NOTE — Progress Notes (Signed)
Pt's here for sore throat, head congestion, feeling dizzy and pain in left side when she cough and diarrhea and frequesnt urination x1wk.  Pt rated at 8/10. Sharp stabbing pain on left side.  Pt states that she fell Satuday night. She stated that she fainted and woke up feeling cold and hot.  Pt reports taken meds this morning.

## 2015-07-01 ENCOUNTER — Encounter (HOSPITAL_COMMUNITY): Payer: Self-pay

## 2015-07-01 ENCOUNTER — Emergency Department (HOSPITAL_COMMUNITY)
Admission: EM | Admit: 2015-07-01 | Discharge: 2015-07-01 | Disposition: A | Discharge status: Home / Self Care | Payer: Medicare HMO | Attending: Emergency Medicine | Admitting: Emergency Medicine

## 2015-07-01 DIAGNOSIS — E079 Disorder of thyroid, unspecified: Secondary | ICD-10-CM | POA: Insufficient documentation

## 2015-07-01 DIAGNOSIS — K219 Gastro-esophageal reflux disease without esophagitis: Secondary | ICD-10-CM | POA: Insufficient documentation

## 2015-07-01 DIAGNOSIS — Z87448 Personal history of other diseases of urinary system: Secondary | ICD-10-CM | POA: Insufficient documentation

## 2015-07-01 DIAGNOSIS — Z79899 Other long term (current) drug therapy: Secondary | ICD-10-CM | POA: Insufficient documentation

## 2015-07-01 DIAGNOSIS — M810 Age-related osteoporosis without current pathological fracture: Secondary | ICD-10-CM | POA: Insufficient documentation

## 2015-07-01 DIAGNOSIS — Z791 Long term (current) use of non-steroidal anti-inflammatories (NSAID): Secondary | ICD-10-CM | POA: Diagnosis not present

## 2015-07-01 DIAGNOSIS — E119 Type 2 diabetes mellitus without complications: Secondary | ICD-10-CM | POA: Insufficient documentation

## 2015-07-01 DIAGNOSIS — H73892 Other specified disorders of tympanic membrane, left ear: Secondary | ICD-10-CM | POA: Diagnosis not present

## 2015-07-01 DIAGNOSIS — J0110 Acute frontal sinusitis, unspecified: Secondary | ICD-10-CM | POA: Insufficient documentation

## 2015-07-01 DIAGNOSIS — I10 Essential (primary) hypertension: Secondary | ICD-10-CM | POA: Diagnosis not present

## 2015-07-01 DIAGNOSIS — Z7984 Long term (current) use of oral hypoglycemic drugs: Secondary | ICD-10-CM | POA: Diagnosis not present

## 2015-07-01 DIAGNOSIS — R51 Headache: Secondary | ICD-10-CM | POA: Diagnosis present

## 2015-07-01 DIAGNOSIS — E785 Hyperlipidemia, unspecified: Secondary | ICD-10-CM | POA: Diagnosis not present

## 2015-07-01 DIAGNOSIS — Z7951 Long term (current) use of inhaled steroids: Secondary | ICD-10-CM | POA: Insufficient documentation

## 2015-07-01 MED ORDER — AMOXICILLIN-POT CLAVULANATE 875-125 MG PO TABS: 1.0000 | ORAL_TABLET | Freq: Two times a day (BID) | ORAL | Status: DC

## 2015-07-01 NOTE — ED Notes (Signed)
Pt states scalp itches and goes into headache.  Pt also states 3-4 weeks ago she had fluid in her ears.  Feels stopped up.  Also wants black head on back checked

## 2015-07-01 NOTE — Discharge Instructions (Signed)

## 2015-07-01 NOTE — ED Provider Notes (Signed)
CSN: [id number]     Arrival date & time 07/01/15  1022 History   First MD Initiated Contact with Patient 07/01/15 1332     Chief Complaint  Patient presents with  . Headache  . Otalgia     (Consider location/radiation/quality/duration/timing/severity/associated sxs/prior Treatment) Patient is a 81 y.o. female presenting with headaches and ear pain. The history is provided by the patient.  Headache Pain location:  Frontal Associated symptoms: ear pain   Associated symptoms: no congestion, no dizziness, no fever, no myalgias, no nausea and no vomiting   Otalgia Location:  Left Behind ear:  No abnormality Quality:  Aching Severity:  Moderate Onset quality:  Gradual Duration: 2. Timing:  Constant Progression:  Worsening Chronicity:  New Relieved by:  Nothing Worsened by:  Nothing tried Ineffective treatments:  None tried Associated symptoms: headaches   Associated symptoms: no congestion, no fever, no rhinorrhea and no vomiting     81 yo F with a chief complaint of headache and left ear ache. This been going on for the past couple days. Patient is felt really stopped up for the past couple weeks. Denies fevers or chills. Has not taken anything for this at home.   Past Medical History  Diagnosis Date  . Thyroid disease   . OAB (overactive bladder)   . Osteoporosis   . Diabetes mellitus without complication ([place])   . Hypertension   . GERD (gastroesophageal reflux disease)   . Hyperlipidemia    Past Surgical History  Procedure Laterality Date  . Abdominal hysterectomy    . Knee arthroscopy     Family History  Problem Relation Age of Onset  . Diabetes Mother   . Hypertension Mother   . Diabetes Sister   . Diabetes Brother    Social History  Substance Use Topics  . Smoking status: Never Smoker   . Smokeless tobacco: Never Used  . Alcohol Use: No   OB History    Gravida Para Term Preterm AB TAB SAB Ectopic Multiple Living   5 5        5      Review of Systems   Constitutional: Negative for fever and chills.  HENT: Positive for ear pain. Negative for congestion and rhinorrhea.   Eyes: Negative for redness and visual disturbance.  Respiratory: Negative for shortness of breath and wheezing.   Cardiovascular: Negative for chest pain and palpitations.  Gastrointestinal: Negative for nausea and vomiting.  Genitourinary: Negative for dysuria and urgency.  Musculoskeletal: Negative for myalgias and arthralgias.  Skin: Negative for pallor and wound.  Neurological: Positive for headaches. Negative for dizziness.      Allergies  Sulfonamide derivatives  Home Medications   Prior to Admission medications   Medication Sig Start Date End Date Taking? Authorizing Provider  alendronate (FOSAMAX) 70 MG tablet Take 70 mg by mouth every 7 (seven) days.  03/11/13  Yes Historical Provider, MD  aspirin EC 81 MG tablet Take 81 mg by mouth daily.   Yes Historical Provider, MD  cholecalciferol (VITAMIN D) 1000 UNITS tablet Take 1,000 Units by mouth daily.   Yes Historical Provider, MD  cyclobenzaprine (FLEXERIL) 5 MG tablet Take 5 mg by mouth 3 (three) times daily as needed for muscle spasms.   Yes Historical Provider, MD  furosemide (LASIX) 20 MG tablet Take 1 tablet by mouth every other day. Monday, Wednesday, and Friday 10/26/14  Yes Historical Provider, MD  insulin aspart (NOVOLOG) 100 UNIT/ML injection Inject 6 Units into the skin 3 (three) times  daily before meals. 05/06/15  Yes Josalyn Funches, MD  Insulin Detemir (LEVEMIR FLEXTOUCH) 100 UNIT/ML Pen Inject 8 Units into the skin 2 (two) times daily. 05/06/15  Yes Josalyn Funches, MD  iron polysaccharides (NIFEREX) 150 MG capsule Take 150 mg by mouth 2 (two) times daily.    Yes Historical Provider, MD  levothyroxine (SYNTHROID, LEVOTHROID) 100 MCG tablet Take 1 tablet (100 mcg total) by mouth daily. 09/25/14  Yes Lorayne Marek, MD  loratadine (CLARITIN) 10 MG tablet Take 10 mg by mouth daily.   Yes Historical  Provider, MD  losartan (COZAAR) 25 MG tablet Take 1 tablet (25 mg total) by mouth daily. 09/25/14  Yes Lorayne Marek, MD  meclizine (ANTIVERT) 12.5 MG tablet Take 1 tablet (12.5 mg total) by mouth 3 (three) times daily as needed for dizziness. 11/13/14  Yes Lacretia Leigh, MD  metFORMIN (GLUCOPHAGE) 1000 MG tablet Take 1,000 mg by mouth 2 (two) times daily with a meal.   Yes Historical Provider, MD  mometasone (NASONEX) 50 MCG/ACT nasal spray Place 2 sprays into the nose daily.   Yes Historical Provider, MD  naproxen sodium (ALEVE) 220 MG tablet Take 1 tablet (220 mg total) by mouth 2 (two) times daily with a meal. Patient taking differently: Take 440 mg by mouth 2 (two) times daily as needed (pain).  05/06/15  Yes Boykin Nearing, MD  NONFORMULARY OR COMPOUNDED ITEM Estradiol 0.02 % 38ml prefilled applicator Sig: apply twice a week Patient taking differently: Estradiol 0.02 % 33ml prefilled applicator Sig: apply twice a week-MF 12/05/14  Yes Terrance Mass, MD  nystatin (MYCOSTATIN) 100000 UNIT/ML suspension Take 5 mLs by mouth 3 (three) times daily. For mouth 10/12/14  Yes Historical Provider, MD  pantoprazole (PROTONIX) 40 MG tablet Take 1 tablet (40 mg total) by mouth daily. 09/25/14  Yes Lorayne Marek, MD  simvastatin (ZOCOR) 40 MG tablet Take 1 tablet (40 mg total) by mouth every evening. 01/04/15  Yes Lorayne Marek, MD  sucralfate (CARAFATE) 1 G tablet Take 1 tablet (1 g total) by mouth 4 (four) times daily -  with meals and at bedtime. 08/17/14  Yes Billy Fischer, MD  traMADol (ULTRAM) 50 MG tablet Take 1 tablet (50 mg total) by mouth every 6 (six) hours as needed for pain. 02/24/13  Yes Billy Fischer, MD  amoxicillin-clavulanate (AUGMENTIN) 875-125 MG tablet Take 1 tablet by mouth 2 (two) times daily. One po bid x 7 days 07/01/15   Deno Etienne, DO   BP 132/71 mmHg  Pulse 75  Temp(Src) 97.7 F (36.5 C) (Oral)  Resp 18  SpO2 99% Physical Exam  Constitutional: She is oriented to person, place, and  time. She appears well-developed and well-nourished. No distress.  HENT:  Head: Normocephalic and atraumatic.  Purulent effusion behind the left TM tender palpation worst over the left frontal sinus swollen turbinates. Posterior oropharynx with mild tonsillar swelling or exudates bilaterally.  Eyes: EOM are normal. Pupils are equal, round, and reactive to light.  Neck: Normal range of motion. Neck supple.  Cardiovascular: Normal rate and regular rhythm.  Exam reveals no gallop and no friction rub.   No murmur heard. Pulmonary/Chest: Effort normal. She has no wheezes. She has no rales.  Abdominal: Soft. She exhibits no distension. There is no tenderness. There is no rebound and no guarding.  Musculoskeletal: She exhibits no edema or tenderness.  Neurological: She is alert and oriented to person, place, and time.  Skin: Skin is warm and dry. She is not  diaphoretic.  Psychiatric: She has a normal mood and affect. Her behavior is normal.  Nursing note and vitals reviewed.   ED Course  Procedures (including critical care time) Labs Review Labs Reviewed - No data to display  Imaging Review No results found. I have personally reviewed and evaluated these images and lab results as part of my medical decision-making.   EKG Interpretation None      MDM   Final diagnoses:  Acute frontal sinusitis, recurrence not specified    81 yo F with a chief complaint of a frontal headache. Patient by history and physical concurrent with acute sinusitis. Patient also with otitis media on the left. Will treat with Augmentin. PCP follow-up.  2:34 PM:  I have discussed the diagnosis/risks/treatment options with the patient and believe the pt to be eligible for discharge home to follow-up with PCP. We also discussed returning to the ED immediately if new or worsening sx occur. We discussed the sx which are most concerning (e.g., sudden worsening pain, fever, inability to tolerate by mouth) that  necessitate immediate return. Medications administered to the patient during their visit and any new prescriptions provided to the patient are listed below.  Medications given during this visit Medications - No data to display  New Prescriptions   AMOXICILLIN-CLAVULANATE (AUGMENTIN) 875-125 MG TABLET    Take 1 tablet by mouth 2 (two) times daily. One po bid x 7 days    The patient appears reasonably screen and/or stabilized for discharge and I doubt any other medical condition or other [facility] requiring further screening, evaluation, or treatment in the ED at this time prior to discharge.      Deno Etienne, DO 07/01/15 1435

## 2015-07-12 ENCOUNTER — Other Ambulatory Visit: Payer: Self-pay | Admitting: Internal Medicine

## 2015-07-16 ENCOUNTER — Other Ambulatory Visit: Payer: Self-pay | Admitting: Family Medicine

## 2015-07-24 DIAGNOSIS — L299 Pruritus, unspecified: Secondary | ICD-10-CM | POA: Diagnosis not present

## 2015-07-24 DIAGNOSIS — L723 Sebaceous cyst: Secondary | ICD-10-CM | POA: Diagnosis not present

## 2015-07-24 DIAGNOSIS — L821 Other seborrheic keratosis: Secondary | ICD-10-CM | POA: Diagnosis not present

## 2015-07-24 DIAGNOSIS — Z23 Encounter for immunization: Secondary | ICD-10-CM | POA: Diagnosis not present

## 2015-07-25 DIAGNOSIS — D1622 Benign neoplasm of long bones of left lower limb: Secondary | ICD-10-CM | POA: Diagnosis not present

## 2015-07-25 DIAGNOSIS — E119 Type 2 diabetes mellitus without complications: Secondary | ICD-10-CM | POA: Diagnosis not present

## 2015-07-25 DIAGNOSIS — Z79899 Other long term (current) drug therapy: Secondary | ICD-10-CM | POA: Diagnosis not present

## 2015-07-25 DIAGNOSIS — M2011 Hallux valgus (acquired), right foot: Secondary | ICD-10-CM | POA: Diagnosis not present

## 2015-07-25 DIAGNOSIS — M2012 Hallux valgus (acquired), left foot: Secondary | ICD-10-CM | POA: Diagnosis not present

## 2015-07-31 DIAGNOSIS — Z23 Encounter for immunization: Secondary | ICD-10-CM | POA: Diagnosis not present

## 2015-08-01 DIAGNOSIS — E1140 Type 2 diabetes mellitus with diabetic neuropathy, unspecified: Secondary | ICD-10-CM | POA: Diagnosis not present

## 2015-08-01 DIAGNOSIS — E1165 Type 2 diabetes mellitus with hyperglycemia: Secondary | ICD-10-CM | POA: Diagnosis not present

## 2015-08-05 ENCOUNTER — Telehealth: Payer: Self-pay | Admitting: Family Medicine

## 2015-08-09 NOTE — Telephone Encounter (Signed)
Pt. Came in requesting for PCP to fill out a parking placard. Paperwork was placed in PCP box. Please f/u with pt.

## 2015-08-11 ENCOUNTER — Other Ambulatory Visit: Payer: Self-pay | Admitting: Family Medicine

## 2015-08-12 NOTE — Telephone Encounter (Signed)
Parking placard completed and ready for pick up

## 2015-08-12 NOTE — Telephone Encounter (Signed)
Pt. Was informed about parking placard being ready for pick up.

## 2015-08-13 DIAGNOSIS — L858 Other specified epidermal thickening: Secondary | ICD-10-CM | POA: Diagnosis not present

## 2015-08-13 DIAGNOSIS — E1140 Type 2 diabetes mellitus with diabetic neuropathy, unspecified: Secondary | ICD-10-CM | POA: Diagnosis not present

## 2015-08-13 DIAGNOSIS — M898X7 Other specified disorders of bone, ankle and foot: Secondary | ICD-10-CM | POA: Diagnosis not present

## 2015-08-13 DIAGNOSIS — E1165 Type 2 diabetes mellitus with hyperglycemia: Secondary | ICD-10-CM | POA: Diagnosis not present

## 2015-08-13 DIAGNOSIS — R239 Unspecified skin changes: Secondary | ICD-10-CM | POA: Diagnosis not present

## 2015-08-13 DIAGNOSIS — D1622 Benign neoplasm of long bones of left lower limb: Secondary | ICD-10-CM | POA: Diagnosis not present

## 2015-08-13 DIAGNOSIS — L942 Calcinosis cutis: Secondary | ICD-10-CM | POA: Diagnosis not present

## 2015-08-13 NOTE — Telephone Encounter (Signed)
Pt picked up parking form on 08/12/2015

## 2015-08-16 DIAGNOSIS — M12272 Villonodular synovitis (pigmented), left ankle and foot: Secondary | ICD-10-CM | POA: Diagnosis not present

## 2015-08-28 ENCOUNTER — Telehealth: Payer: Self-pay | Admitting: Family Medicine

## 2015-08-28 DIAGNOSIS — M12272 Villonodular synovitis (pigmented), left ankle and foot: Secondary | ICD-10-CM | POA: Diagnosis not present

## 2015-08-28 NOTE — Telephone Encounter (Signed)
Pt. Called to let her PCP know that she will not be coming back to [facility]. Pt. Stated she has a new PCP.

## 2015-09-03 DIAGNOSIS — E1140 Type 2 diabetes mellitus with diabetic neuropathy, unspecified: Secondary | ICD-10-CM | POA: Diagnosis not present

## 2015-09-03 DIAGNOSIS — Z1239 Encounter for other screening for malignant neoplasm of breast: Secondary | ICD-10-CM | POA: Diagnosis not present

## 2015-09-03 DIAGNOSIS — J309 Allergic rhinitis, unspecified: Secondary | ICD-10-CM | POA: Diagnosis not present

## 2015-09-03 DIAGNOSIS — I10 Essential (primary) hypertension: Secondary | ICD-10-CM | POA: Diagnosis not present

## 2015-09-03 DIAGNOSIS — M25511 Pain in right shoulder: Secondary | ICD-10-CM | POA: Diagnosis not present

## 2015-09-04 ENCOUNTER — Other Ambulatory Visit: Payer: Self-pay | Admitting: Family Medicine

## 2015-09-04 DIAGNOSIS — Z1231 Encounter for screening mammogram for malignant neoplasm of breast: Secondary | ICD-10-CM

## 2015-09-05 DIAGNOSIS — H9202 Otalgia, left ear: Secondary | ICD-10-CM | POA: Diagnosis not present

## 2015-09-05 DIAGNOSIS — M26622 Arthralgia of left temporomandibular joint: Secondary | ICD-10-CM | POA: Diagnosis not present

## 2015-09-05 DIAGNOSIS — J3489 Other specified disorders of nose and nasal sinuses: Secondary | ICD-10-CM | POA: Diagnosis not present

## 2015-09-09 DIAGNOSIS — M25572 Pain in left ankle and joints of left foot: Secondary | ICD-10-CM | POA: Diagnosis not present

## 2015-09-09 DIAGNOSIS — L97529 Non-pressure chronic ulcer of other part of left foot with unspecified severity: Secondary | ICD-10-CM | POA: Diagnosis not present

## 2015-09-09 DIAGNOSIS — L97521 Non-pressure chronic ulcer of other part of left foot limited to breakdown of skin: Secondary | ICD-10-CM | POA: Diagnosis not present

## 2015-09-13 DIAGNOSIS — E161 Other hypoglycemia: Secondary | ICD-10-CM | POA: Diagnosis not present

## 2015-09-13 DIAGNOSIS — E162 Hypoglycemia, unspecified: Secondary | ICD-10-CM | POA: Diagnosis not present

## 2015-09-25 DIAGNOSIS — M12272 Villonodular synovitis (pigmented), left ankle and foot: Secondary | ICD-10-CM | POA: Diagnosis not present

## 2015-09-30 ENCOUNTER — Ambulatory Visit
Admission: RE | Admit: 2015-09-30 | Discharge: 2015-09-30 | Disposition: A | Discharge status: Home / Self Care | Payer: Medicare HMO | Source: Ambulatory Visit | Attending: Family Medicine | Admitting: Family Medicine

## 2015-09-30 DIAGNOSIS — Z1231 Encounter for screening mammogram for malignant neoplasm of breast: Secondary | ICD-10-CM | POA: Diagnosis not present

## 2015-10-08 DIAGNOSIS — Z79899 Other long term (current) drug therapy: Secondary | ICD-10-CM | POA: Diagnosis not present

## 2015-10-08 DIAGNOSIS — E1142 Type 2 diabetes mellitus with diabetic polyneuropathy: Secondary | ICD-10-CM | POA: Diagnosis not present

## 2015-10-08 DIAGNOSIS — E1165 Type 2 diabetes mellitus with hyperglycemia: Secondary | ICD-10-CM | POA: Diagnosis not present

## 2015-10-08 DIAGNOSIS — Z23 Encounter for immunization: Secondary | ICD-10-CM | POA: Diagnosis not present

## 2015-10-08 DIAGNOSIS — Z794 Long term (current) use of insulin: Secondary | ICD-10-CM | POA: Diagnosis not present

## 2015-10-18 DIAGNOSIS — M170 Bilateral primary osteoarthritis of knee: Secondary | ICD-10-CM | POA: Diagnosis not present

## 2015-10-18 DIAGNOSIS — N183 Chronic kidney disease, stage 3 (moderate): Secondary | ICD-10-CM | POA: Diagnosis not present

## 2015-10-29 DIAGNOSIS — Z0000 Encounter for general adult medical examination without abnormal findings: Secondary | ICD-10-CM | POA: Diagnosis not present

## 2015-10-30 DIAGNOSIS — Z86010 Personal history of colonic polyps: Secondary | ICD-10-CM | POA: Diagnosis not present

## 2015-10-30 DIAGNOSIS — K5900 Constipation, unspecified: Secondary | ICD-10-CM | POA: Diagnosis not present

## 2015-11-13 ENCOUNTER — Other Ambulatory Visit: Payer: Self-pay | Admitting: Family Medicine

## 2015-11-18 ENCOUNTER — Other Ambulatory Visit: Payer: Self-pay | Admitting: Family Medicine

## 2015-11-21 DIAGNOSIS — H2513 Age-related nuclear cataract, bilateral: Secondary | ICD-10-CM | POA: Diagnosis not present

## 2015-11-21 DIAGNOSIS — Z794 Long term (current) use of insulin: Secondary | ICD-10-CM | POA: Diagnosis not present

## 2015-11-21 DIAGNOSIS — H5203 Hypermetropia, bilateral: Secondary | ICD-10-CM | POA: Diagnosis not present

## 2015-11-21 DIAGNOSIS — E119 Type 2 diabetes mellitus without complications: Secondary | ICD-10-CM | POA: Diagnosis not present

## 2015-11-21 DIAGNOSIS — H25013 Cortical age-related cataract, bilateral: Secondary | ICD-10-CM | POA: Diagnosis not present

## 2015-11-21 DIAGNOSIS — H524 Presbyopia: Secondary | ICD-10-CM | POA: Diagnosis not present

## 2015-12-03 DIAGNOSIS — E1140 Type 2 diabetes mellitus with diabetic neuropathy, unspecified: Secondary | ICD-10-CM | POA: Diagnosis not present

## 2015-12-03 DIAGNOSIS — E1165 Type 2 diabetes mellitus with hyperglycemia: Secondary | ICD-10-CM | POA: Diagnosis not present

## 2015-12-04 ENCOUNTER — Other Ambulatory Visit: Payer: Self-pay | Admitting: Gastroenterology

## 2015-12-04 DIAGNOSIS — D125 Benign neoplasm of sigmoid colon: Secondary | ICD-10-CM | POA: Diagnosis not present

## 2015-12-04 DIAGNOSIS — Z86010 Personal history of colonic polyps: Secondary | ICD-10-CM | POA: Diagnosis not present

## 2015-12-04 DIAGNOSIS — D126 Benign neoplasm of colon, unspecified: Secondary | ICD-10-CM | POA: Diagnosis not present

## 2015-12-04 DIAGNOSIS — D122 Benign neoplasm of ascending colon: Secondary | ICD-10-CM | POA: Diagnosis not present

## 2015-12-20 DIAGNOSIS — M65871 Other synovitis and tenosynovitis, right ankle and foot: Secondary | ICD-10-CM | POA: Diagnosis not present

## 2015-12-20 DIAGNOSIS — M71571 Other bursitis, not elsewhere classified, right ankle and foot: Secondary | ICD-10-CM | POA: Diagnosis not present

## 2015-12-20 DIAGNOSIS — M25571 Pain in right ankle and joints of right foot: Secondary | ICD-10-CM | POA: Diagnosis not present

## 2015-12-20 DIAGNOSIS — M2011 Hallux valgus (acquired), right foot: Secondary | ICD-10-CM | POA: Diagnosis not present

## 2015-12-20 DIAGNOSIS — M722 Plantar fascial fibromatosis: Secondary | ICD-10-CM | POA: Diagnosis not present

## 2016-01-16 DIAGNOSIS — M71571 Other bursitis, not elsewhere classified, right ankle and foot: Secondary | ICD-10-CM | POA: Diagnosis not present

## 2016-01-16 DIAGNOSIS — M65871 Other synovitis and tenosynovitis, right ankle and foot: Secondary | ICD-10-CM | POA: Diagnosis not present

## 2016-01-20 ENCOUNTER — Ambulatory Visit (INDEPENDENT_AMBULATORY_CARE_PROVIDER_SITE_OTHER): Payer: Medicare HMO | Admitting: Gynecology

## 2016-01-20 ENCOUNTER — Encounter: Payer: Self-pay | Admitting: Gynecology

## 2016-01-20 VITALS — BP 128/86 | Ht 65.5 in | Wt 150.0 lb

## 2016-01-20 DIAGNOSIS — Z01419 Encounter for gynecological examination (general) (routine) without abnormal findings: Secondary | ICD-10-CM

## 2016-01-20 DIAGNOSIS — R350 Frequency of micturition: Secondary | ICD-10-CM

## 2016-01-20 DIAGNOSIS — M810 Age-related osteoporosis without current pathological fracture: Secondary | ICD-10-CM | POA: Diagnosis not present

## 2016-01-20 DIAGNOSIS — N898 Other specified noninflammatory disorders of vagina: Secondary | ICD-10-CM | POA: Diagnosis not present

## 2016-01-20 DIAGNOSIS — R300 Dysuria: Secondary | ICD-10-CM

## 2016-01-20 DIAGNOSIS — M545 Low back pain, unspecified: Secondary | ICD-10-CM

## 2016-01-20 DIAGNOSIS — N952 Postmenopausal atrophic vaginitis: Secondary | ICD-10-CM

## 2016-01-20 DIAGNOSIS — Z79890 Hormone replacement therapy (postmenopausal): Secondary | ICD-10-CM

## 2016-01-20 DIAGNOSIS — IMO0001 Reserved for inherently not codable concepts without codable children: Secondary | ICD-10-CM

## 2016-01-20 LAB — URINALYSIS W MICROSCOPIC + REFLEX CULTURE
BACTERIA UA: NONE SEEN [HPF]
Bilirubin Urine: NEGATIVE
CRYSTALS: NONE SEEN [HPF]
Casts: NONE SEEN [LPF]
HGB URINE DIPSTICK: NEGATIVE
Ketones, ur: NEGATIVE
Leukocytes, UA: NEGATIVE
Nitrite: NEGATIVE
PROTEIN: NEGATIVE
RBC / HPF: NONE SEEN RBC/HPF (ref <=2)
Specific Gravity, Urine: <1.005 (ref 1.001–1.035)
WBC, UA: NONE SEEN WBC/HPF (ref <=5)
YEAST: NONE SEEN [HPF]
pH: 5 (ref 5.0–8.0)

## 2016-01-20 LAB — WET PREP FOR TRICH, YEAST, CLUE
CLUE CELLS WET PREP: NONE SEEN
TRICH WET PREP: NONE SEEN
WBC WET PREP: NONE SEEN
YEAST WET PREP: NONE SEEN

## 2016-01-20 MED ORDER — NONFORMULARY OR COMPOUNDED ITEM: Status: DC

## 2016-01-20 NOTE — Progress Notes (Signed)
[name] [name] 1933/09/28 [id number]   History:    82 y.o.  for annual gyn exam who presented to the office today with several complaints. She does have history of vaginal atrophy and had run out of her estrogen vaginal cream which she applies twice a week and has been off now for 3 months. Occasionally she'll feel some dysuria and some frequency and some mild low back discomfort but no fever, chills, nausea, or vomiting. Her PCP has been doing her blood work and last year she was started on Fosamax 70 mg every weekly for her osteoporosis. Today she thought she may have had a-like discharge at times when she wipes.  Patient was seen for the first time as a new patient in 2015. She was a former patient of Dr. Ubaldo Glassing.The patient in the past had history of overactive bladder, osteopenia, hypertension, type 2 diabetes and dyslipidemia. Her primary physician is Dr. Lorenda Hatchet who has been doing her blood work. She is currently taking calcium and vitamin D daily.  Patient had a colonoscopy in 2012 benign polyps were removed.Also she had many years ago in 1967 an abdominal hysterectomy with unilateral oophorectomy for what appears to be heavy cycles and fibroid uterus questionable cyst. She stated all was benign. Patient prior to her hysterectomy and afterwards have never had a history of abnormal Pap smears. She denies any stress urinary incontinence.  Past medical history,surgical history, family history and social history were all reviewed and documented in the EPIC chart.  Gynecologic History No LMP recorded. Patient has had a hysterectomy. Contraception: status post hysterectomy Last Pap: 2012. Results were: normal Last mammogram: 2017. Results were: normal  Obstetric History OB History  Gravida Para Term Preterm AB SAB TAB Ectopic Multiple Living  5 5        5     # Outcome Date GA Lbr Len/2nd Weight Sex Delivery Anes PTL Lv  5 Para           4 Para           3 Para           2 Para            1 Para                ROS: A ROS was performed and pertinent positives and negatives are included in the history.  GENERAL: No fevers or chills. HEENT: No change in vision, no earache, sore throat or sinus congestion. NECK: No pain or stiffness. CARDIOVASCULAR: No chest pain or pressure. No palpitations. PULMONARY: No shortness of breath, cough or wheeze. GASTROINTESTINAL: No abdominal pain, nausea, vomiting or diarrhea, melena or bright red blood per rectum. GENITOURINARY: No urinary frequency, urgency, hesitancy or dysuria. MUSCULOSKELETAL: No joint or muscle pain, no back pain, no recent trauma. DERMATOLOGIC: No rash, no itching, no lesions. ENDOCRINE: No polyuria, polydipsia, no heat or cold intolerance. No recent change in weight. HEMATOLOGICAL: No anemia or easy bruising or bleeding. NEUROLOGIC: No headache, seizures, numbness, tingling or weakness. PSYCHIATRIC: No depression, no loss of interest in normal activity or change in sleep pattern.     Exam: chaperone present  BP 128/86 mmHg  Ht 5' 5.5" (1.664 m)  Wt 150 lb (68.04 kg)  BMI 24.57 kg/m2  Body mass index is 24.57 kg/(m^2).  General appearance : Well developed well nourished female. No acute distress HEENT: Eyes: no retinal hemorrhage or exudates,  Neck supple, trachea midline, no carotid bruits, no thyroidmegaly Lungs:  Clear to auscultation, no rhonchi or wheezes, or rib retractions  Heart: Regular rate and rhythm, no murmurs or gallops Breast:Examined in sitting and supine position were symmetrical in appearance, no palpable masses or tenderness,  no skin retraction, no nipple inversion, no nipple discharge, no skin discoloration, no axillary or supraclavicular lymphadenopathy Abdomen: no palpable masses or tenderness, no rebound or guarding Extremities: no edema or skin discoloration or tenderness  Pelvic:  Bartholin, Urethra, Skene Glands: Within normal limits             Vagina: No gross lesions or discharge, vaginal  atrophy  Cervix: Absent  Uterus  absent  Adnexa  Without masses or tenderness  Anus and perineum  normal   Rectovaginal  normal sphincter tone without palpated masses or tenderness             Hemoccult PCP provides   Urinalysis 1+ sugar are otherwise no evidence of infection.  Wet prep negative  Assessment/Plan:  82 y.o. female for annual exam with vaginal atrophy I do believe that her symptoms of pressure in her vaginal area and discomfort during urination may be attributed to her vaginal atrophy. She had ran out of her medication and then placed on vaginal estrogen cream intravaginally twice a week for the past 3 months. Prescription refill was provided. Pap smear not indicated. She was encouraged inferior calcium vitamin D and weightbearing exercises. She'll continue her Fosamax 70 mg every weekly that her PCP started for her osteoporosis. She should have a bone density study next year.   Terrance Mass MD, 11:42 AM 01/20/2016

## 2016-01-31 DIAGNOSIS — M71571 Other bursitis, not elsewhere classified, right ankle and foot: Secondary | ICD-10-CM | POA: Diagnosis not present

## 2016-01-31 DIAGNOSIS — M722 Plantar fascial fibromatosis: Secondary | ICD-10-CM | POA: Diagnosis not present

## 2016-01-31 DIAGNOSIS — L303 Infective dermatitis: Secondary | ICD-10-CM | POA: Diagnosis not present

## 2016-02-10 DIAGNOSIS — E1140 Type 2 diabetes mellitus with diabetic neuropathy, unspecified: Secondary | ICD-10-CM | POA: Diagnosis not present

## 2016-02-10 DIAGNOSIS — E1165 Type 2 diabetes mellitus with hyperglycemia: Secondary | ICD-10-CM | POA: Diagnosis not present

## 2016-02-12 ENCOUNTER — Emergency Department (HOSPITAL_COMMUNITY): Payer: Medicare HMO

## 2016-02-12 ENCOUNTER — Encounter (HOSPITAL_COMMUNITY): Payer: Self-pay | Admitting: Emergency Medicine

## 2016-02-12 ENCOUNTER — Emergency Department (HOSPITAL_COMMUNITY)
Admission: EM | Admit: 2016-02-12 | Discharge: 2016-02-12 | Disposition: A | Discharge status: Home / Self Care | Payer: Medicare HMO | Attending: Emergency Medicine | Admitting: Emergency Medicine

## 2016-02-12 DIAGNOSIS — Z7982 Long term (current) use of aspirin: Secondary | ICD-10-CM | POA: Insufficient documentation

## 2016-02-12 DIAGNOSIS — R109 Unspecified abdominal pain: Secondary | ICD-10-CM | POA: Insufficient documentation

## 2016-02-12 DIAGNOSIS — E785 Hyperlipidemia, unspecified: Secondary | ICD-10-CM | POA: Insufficient documentation

## 2016-02-12 DIAGNOSIS — M549 Dorsalgia, unspecified: Secondary | ICD-10-CM | POA: Diagnosis not present

## 2016-02-12 DIAGNOSIS — Z794 Long term (current) use of insulin: Secondary | ICD-10-CM | POA: Diagnosis not present

## 2016-02-12 DIAGNOSIS — R748 Abnormal levels of other serum enzymes: Secondary | ICD-10-CM

## 2016-02-12 DIAGNOSIS — E1165 Type 2 diabetes mellitus with hyperglycemia: Secondary | ICD-10-CM | POA: Insufficient documentation

## 2016-02-12 DIAGNOSIS — R300 Dysuria: Secondary | ICD-10-CM | POA: Insufficient documentation

## 2016-02-12 DIAGNOSIS — Z79891 Long term (current) use of opiate analgesic: Secondary | ICD-10-CM | POA: Insufficient documentation

## 2016-02-12 DIAGNOSIS — I10 Essential (primary) hypertension: Secondary | ICD-10-CM | POA: Insufficient documentation

## 2016-02-12 DIAGNOSIS — M5412 Radiculopathy, cervical region: Secondary | ICD-10-CM

## 2016-02-12 DIAGNOSIS — Z7984 Long term (current) use of oral hypoglycemic drugs: Secondary | ICD-10-CM | POA: Insufficient documentation

## 2016-02-12 DIAGNOSIS — R51 Headache: Secondary | ICD-10-CM | POA: Diagnosis not present

## 2016-02-12 DIAGNOSIS — M542 Cervicalgia: Secondary | ICD-10-CM | POA: Diagnosis present

## 2016-02-12 DIAGNOSIS — R079 Chest pain, unspecified: Secondary | ICD-10-CM | POA: Diagnosis not present

## 2016-02-12 DIAGNOSIS — R739 Hyperglycemia, unspecified: Secondary | ICD-10-CM

## 2016-02-12 DIAGNOSIS — K5900 Constipation, unspecified: Secondary | ICD-10-CM | POA: Diagnosis not present

## 2016-02-12 LAB — COMPREHENSIVE METABOLIC PANEL
ALBUMIN: 3.9 g/dL (ref 3.5–5.0)
ALK PHOS: 64 U/L (ref 38–126)
ALT: 21 U/L (ref 14–54)
ANION GAP: 10 (ref 5–15)
AST: 33 U/L (ref 15–41)
BILIRUBIN TOTAL: 0.5 mg/dL (ref 0.3–1.2)
BUN: 26 mg/dL — AB (ref 6–20)
CALCIUM: 9.8 mg/dL (ref 8.9–10.3)
CO2: 30 mmol/L (ref 22–32)
Chloride: 95 mmol/L — ABNORMAL LOW (ref 101–111)
Creatinine, Ser: 1.21 mg/dL — ABNORMAL HIGH (ref 0.44–1.00)
GFR calc Af Amer: 48 mL/min — ABNORMAL LOW (ref >60)
GFR, EST NON AFRICAN AMERICAN: 41 mL/min — AB (ref >60)
GLUCOSE: 229 mg/dL — AB (ref 65–99)
Potassium: 3.9 mmol/L (ref 3.5–5.1)
Sodium: 135 mmol/L (ref 135–145)
TOTAL PROTEIN: 7.3 g/dL (ref 6.5–8.1)

## 2016-02-12 LAB — CBC WITH DIFFERENTIAL/PLATELET
Basophils Absolute: 0 10*3/uL (ref 0.0–0.1)
Basophils Relative: 0 %
EOS ABS: 0.1 10*3/uL (ref 0.0–0.7)
Eosinophils Relative: 1 %
HEMATOCRIT: 32.2 % — AB (ref 36.0–46.0)
Hemoglobin: 10.7 g/dL — ABNORMAL LOW (ref 12.0–15.0)
LYMPHS ABS: 1.2 10*3/uL (ref 0.7–4.0)
Lymphocytes Relative: 20 %
MCH: 30.9 pg (ref 26.0–34.0)
MCHC: 33.2 g/dL (ref 30.0–36.0)
MCV: 93.1 fL (ref 78.0–100.0)
MONO ABS: 0.3 10*3/uL (ref 0.1–1.0)
MONOS PCT: 4 %
NEUTROS PCT: 75 %
Neutro Abs: 4.4 10*3/uL (ref 1.7–7.7)
Platelets: 296 10*3/uL (ref 150–400)
RBC: 3.46 MIL/uL — ABNORMAL LOW (ref 3.87–5.11)
RDW: 12.8 % (ref 11.5–15.5)
WBC: 5.9 10*3/uL (ref 4.0–10.5)

## 2016-02-12 LAB — URINALYSIS, ROUTINE W REFLEX MICROSCOPIC
BILIRUBIN URINE: NEGATIVE
Glucose, UA: NEGATIVE mg/dL
HGB URINE DIPSTICK: NEGATIVE
Ketones, ur: NEGATIVE mg/dL
Leukocytes, UA: NEGATIVE
Nitrite: NEGATIVE
PH: 7.5 (ref 5.0–8.0)
Protein, ur: NEGATIVE mg/dL
SPECIFIC GRAVITY, URINE: 1.011 (ref 1.005–1.030)

## 2016-02-12 LAB — I-STAT TROPONIN, ED: TROPONIN I, POC: 0 ng/mL (ref 0.00–0.08)

## 2016-02-12 NOTE — ED Triage Notes (Signed)
Pt c/o left sided neck pain radiating down left arm x "a couple months." Hx neck pain since fall 2 years ago. Pt also reports bilateral arm numbness and tingling.

## 2016-02-12 NOTE — Discharge Instructions (Signed)
Drink plenty of fluids. Please follow up with primary care doctor and orthopedics for re evaluatio.

## 2016-02-12 NOTE — ED Provider Notes (Signed)
[facility] DEPT Provider Note   CSN: [id number] Arrival date & time: 02/12/16  [id number]  First Provider Contact:  First MD Initiated Contact with Patient 02/12/16 1212        History   Chief Complaint Chief Complaint  Patient presents with  . Numbness  . Neck Pain    HPI [name] [name] is a 82 y.o. female.  [name] [name] is a 82 y.o. female with history of diabetes, hypertension, acid reflux, overactive bladder, presents to emergency department with multiple complaints. Patient reports that for the last year she has had intermittent pain in her left side of the head, left neck, left shoulder, left arm. She states that her primary care doctor sent her to ear nose throat doctor who told her to hold ice packs to the left side of the face. Patient states it is not helping. He believes that this headache started after a fall over a year ago. Patient is also complaining of constipation and abdominal pain for the last 3 months after having a colonoscopy. Patient states that she was told to take MiraLAX which she has been taking, and states that she had a very large bowel movement this morning. States she has also had suprapubic issues and trouble urinating. Recently seen by her OBGYN for this. Pt also complaining of pain down both legs. States legs have been swelling. Pt also states intermittent chest pain and abdominal pain. Denies taking any medications for this.        Past Medical History:  Diagnosis Date  . Diabetes mellitus without complication ([place])   . GERD (gastroesophageal reflux disease)   . Hyperlipidemia   . Hypertension   . OAB (overactive bladder)   . Osteoporosis   . Thyroid disease     Patient Active Problem List   Diagnosis Date Noted  . Right hip pain 05/06/2015  . Vaginal atrophy 11/01/2013  . ECZEMA 07/24/2008  . INTERMITTENT VERTIGO 07/24/2008  . CONSTIPATION 11/16/2007  . INSOMNIA 08/12/2007  . Essential hypertension 02/14/2007  . ALLERGIC RHINITIS  02/14/2007  . HYPOTHYROIDISM 01/26/2007  . Diabetes type 2, controlled ([place]) 01/26/2007  . HYPERLIPIDEMIA 01/26/2007  . GERD 01/26/2007  . DEGENERATIVE JOINT DISEASE 01/26/2007  . OSTEOPENIA 01/26/2007    Past Surgical History:  Procedure Laterality Date  . ABDOMINAL HYSTERECTOMY    . KNEE ARTHROSCOPY      OB History    Gravida Para Term Preterm AB Living   5 5       5    SAB TAB Ectopic Multiple Live Births                   Home Medications    Prior to Admission medications   Medication Sig Start Date End Date Taking? Authorizing Provider  alendronate (FOSAMAX) 70 MG tablet Take 70 mg by mouth every 7 (seven) days.  03/11/13  Yes Historical Provider, MD  aspirin EC 81 MG tablet Take 81 mg by mouth daily.   Yes Historical Provider, MD  cholecalciferol (VITAMIN D) 1000 UNITS tablet Take 1,000 Units by mouth daily.   Yes Historical Provider, MD  cyclobenzaprine (FLEXERIL) 5 MG tablet Take 5 mg by mouth 3 (three) times daily as needed for muscle spasms.   Yes Historical Provider, MD  furosemide (LASIX) 20 MG tablet Take 20 mg by mouth every other day. Monday, Wednesday, and Friday 10/26/14  Yes Historical Provider, MD  insulin aspart (NOVOLOG) 100 UNIT/ML injection Inject 6 Units into the skin 3 (three)  times daily before meals. 05/06/15  Yes Josalyn Funches, MD  Insulin Detemir (LEVEMIR FLEXTOUCH) 100 UNIT/ML Pen Inject 8 Units into the skin 2 (two) times daily. 05/06/15  Yes Josalyn Funches, MD  iron polysaccharides (NIFEREX) 150 MG capsule Take 150 mg by mouth 2 (two) times daily.    Yes Historical Provider, MD  levothyroxine (SYNTHROID, LEVOTHROID) 100 MCG tablet Take 1 tablet (100 mcg total) by mouth daily. 09/25/14  Yes Lorayne Marek, MD  loratadine (CLARITIN) 10 MG tablet Take 10 mg by mouth every other day.    Yes Historical Provider, MD  losartan (COZAAR) 25 MG tablet Take 1 tablet (25 mg total) by mouth daily. 09/25/14  Yes Lorayne Marek, MD  meclizine (ANTIVERT) 12.5 MG tablet  Take 1 tablet (12.5 mg total) by mouth 3 (three) times daily as needed for dizziness. 11/13/14  Yes Lacretia Leigh, MD  metFORMIN (GLUCOPHAGE) 1000 MG tablet Take 1,000 mg by mouth 2 (two) times daily with a meal.   Yes Historical Provider, MD  naproxen sodium (ALEVE) 220 MG tablet Take 1 tablet (220 mg total) by mouth 2 (two) times daily with a meal. Patient taking differently: Take 440 mg by mouth 2 (two) times daily as needed (pain).  05/06/15  Yes Josalyn Funches, MD  NONFORMULARY OR COMPOUNDED ITEM Estradiol 0.02 % 83ml prefilled applicator Sig: apply twice a week 01/20/16  Yes Terrance Mass, MD  nystatin (MYCOSTATIN) 100000 UNIT/ML suspension Take 5 mLs by mouth 2 (two) times daily. For mouth 10/12/14  Yes Historical Provider, MD  pantoprazole (PROTONIX) 40 MG tablet Take 1 tablet (40 mg total) by mouth daily. 09/25/14  Yes Deepak Advani, MD  simvastatin (ZOCOR) 40 MG tablet TAKE 1 TABLET BY MOUTH EVERY EVENING. NEED OFFICE VISIT FOR MORE REFILLS 08/12/15  Yes Boykin Nearing, MD    Family History Family History  Problem Relation Age of Onset  . Diabetes Mother   . Hypertension Mother   . Diabetes Sister   . Diabetes Brother     Social History Social History  Substance Use Topics  . Smoking status: Never Smoker  . Smokeless tobacco: Never Used  . Alcohol use No     Allergies   Sulfonamide derivatives   Review of Systems Review of Systems  Constitutional: Negative for chills and fever.  Respiratory: Negative for cough, chest tightness and shortness of breath.   Cardiovascular: Positive for chest pain. Negative for palpitations and leg swelling.  Gastrointestinal: Positive for abdominal pain and constipation. Negative for diarrhea, nausea, rectal pain and vomiting.  Genitourinary: Positive for difficulty urinating and frequency. Negative for dysuria, flank pain, pelvic pain, vaginal bleeding, vaginal discharge and vaginal pain.  Musculoskeletal: Positive for arthralgias, back  pain and myalgias. Negative for neck pain and neck stiffness.  Skin: Negative for rash.  Neurological: Positive for headaches. Negative for dizziness, seizures, syncope, facial asymmetry, speech difficulty, weakness and light-headedness.  All other systems reviewed and are negative.    Physical Exam Updated Vital Signs BP 139/60   Pulse 66   Temp 98.7 F (37.1 C) (Oral)   Resp 16   Ht 5\' 7"  (1.702 m)   Wt 65.8 kg   SpO2 100%   BMI 22.71 kg/m   Physical Exam  Constitutional: She is oriented to person, place, and time. She appears well-developed and well-nourished. No distress.  HENT:  Head: Normocephalic and atraumatic.  Eyes: Conjunctivae and EOM are normal. Pupils are equal, round, and reactive to light.  Neck: Normal range of motion.  Neck supple.  Cardiovascular: Normal rate, regular rhythm and normal heart sounds.   Pulmonary/Chest: Effort normal and breath sounds normal. No respiratory distress. She has no wheezes. She has no rales.  Musculoskeletal: Normal range of motion. She exhibits no edema.  Neurological: She is alert and oriented to person, place, and time. No cranial nerve deficit. Coordination normal.  5/5 and equal upper and lower extremity strength bilaterally. Equal grip strength bilaterally. Normal finger to nose and heel to shin. No pronator drift.   Skin: Skin is warm and dry.  Psychiatric: She has a normal mood and affect. Her behavior is normal.  Nursing note and vitals reviewed.    ED Treatments / Results  Labs (all labs ordered are listed, but only abnormal results are displayed) Labs Reviewed  CBC WITH DIFFERENTIAL/PLATELET - Abnormal; Notable for the following:       Result Value   RBC 3.46 (*)    Hemoglobin 10.7 (*)    HCT 32.2 (*)    All other components within normal limits  COMPREHENSIVE METABOLIC PANEL - Abnormal; Notable for the following:    Chloride 95 (*)    Glucose, Bld 229 (*)    BUN 26 (*)    Creatinine, Ser 1.21 (*)    GFR calc  non Af Amer 41 (*)    GFR calc Af Amer 48 (*)    All other components within normal limits  URINALYSIS, ROUTINE W REFLEX MICROSCOPIC (NOT AT [facility])  I-STAT TROPOININ, ED    EKG  EKG Interpretation  Date/Time:  Wednesday February 12 2016 14:09:20 EDT Ventricular Rate:  54 PR Interval:    QRS Duration: 97 QT Interval:  430 QTC Calculation: 408 R Axis:   64 Text Interpretation:  Sinus rhythm Borderline prolonged PR interval No significant change since last tracing Confirmed by [employer] MD, ERIN ([id number]) on 02/12/2016 2:16:58 PM       Radiology Dg Chest 2 View  Result Date: 02/12/2016 CLINICAL DATA:  Left-sided chest pain. EXAM: CHEST  2 VIEW COMPARISON:  Radiographs of August 17, 2014. FINDINGS: The heart size and mediastinal contours are within normal limits. Both lungs are clear. No pneumothorax or pleural effusion is noted. Atherosclerosis of thoracic aorta is noted. The visualized skeletal structures are unremarkable. IMPRESSION: No active cardiopulmonary disease.  Aortic atherosclerosis. Electronically Signed   By: Marijo Conception, M.D.   On: 02/12/2016 13:21  Dg Abd 2 Views  Result Date: 02/12/2016 CLINICAL DATA:  Abdominal pain EXAM: ABDOMEN - 2 VIEW COMPARISON:  CT abdomen and pelvis Dec 04, 2005 FINDINGS: Supine and upright images were obtained. There is no bowel dilatation. There are scattered air-fluid levels throughout the abdomen. No free air. There is postoperative change in the left pelvis. Lung bases appear clear. IMPRESSION: Scattered air-fluid levels without bowel dilatation. Suspect early ileus or enteritis. Obstruction is felt to be less likely. No free air. Lung bases clear. Electronically Signed   By: Lowella Grip III M.D.   On: 02/12/2016 13:21   Procedures Procedures (including critical care time)  Medications Ordered in ED Medications - No data to display   Initial Impression / Assessment and Plan / ED Course  I have reviewed the triage vital signs and  the nursing notes.  Pertinent labs & imaging results that were available during my care of the patient were reviewed by me and considered in my medical decision making (see chart for details).  Clinical Course  Comment By Time  Patient's glucose elevated to 29.  BUN/creatinine and creatinine is elevated at 1.21 and 26. Question mild dehydration. Will have a follow-up with orthopedics regarding her shoulder and neck pain. Will however increase intake of fluids. Follow-up with family doctor. Jeannett Senior, PA-C 07/26 1437   Pt in ED with multiple complaints, all of which have been going on for months. Pt does not appear to be in any distress. Pleasant. VS stable. Labs showing elevated BUN/Creat at 26 and 1.21, question dehydration. Glucose 229. HGB 10.7. No other significant findings. I suspect most of the patient's complaints are chronic, and she is stable for discharge home at this time with outpatient follow-up. She is agreeable to that. Encouraged to drink more fluids.  Final Clinical Impressions(s) / ED Diagnoses   Final diagnoses:  Abdominal pain  Cervical radiculopathy  Dysuria  Constipation, unspecified constipation type  Hyperglycemia  Elevated creatine kinase    New Prescriptions Discharge Medication List as of 02/12/2016  2:49 PM       Jeannett Senior, PA-C 02/12/16 2120    Gareth Morgan, MD 02/14/16 [phone]

## 2016-02-17 ENCOUNTER — Telehealth: Payer: Self-pay | Admitting: *Deleted

## 2016-02-17 NOTE — Telephone Encounter (Signed)
Pt was given refill on compound estradiol vaginal cream 0.02% on office visit 01/20/16, pt said she is still having vaginal burning and burning with urination as noted on recent office visit, no improvement at all. Should pt continue Rx longer and give it time? Please advise

## 2016-02-17 NOTE — Telephone Encounter (Signed)
Office visit this week

## 2016-02-18 ENCOUNTER — Ambulatory Visit: Payer: Medicare HMO | Admitting: Women's Health

## 2016-02-18 NOTE — Telephone Encounter (Signed)
Pt said she feeling better today, is going to try cream a little longer, will call back if no improvement.

## 2016-02-21 DIAGNOSIS — M65871 Other synovitis and tenosynovitis, right ankle and foot: Secondary | ICD-10-CM | POA: Diagnosis not present

## 2016-02-21 DIAGNOSIS — M25571 Pain in right ankle and joints of right foot: Secondary | ICD-10-CM | POA: Diagnosis not present

## 2016-02-24 DIAGNOSIS — M25512 Pain in left shoulder: Secondary | ICD-10-CM | POA: Diagnosis not present

## 2016-02-24 DIAGNOSIS — M47812 Spondylosis without myelopathy or radiculopathy, cervical region: Secondary | ICD-10-CM | POA: Diagnosis not present

## 2016-02-28 DIAGNOSIS — Z794 Long term (current) use of insulin: Secondary | ICD-10-CM | POA: Diagnosis not present

## 2016-02-28 DIAGNOSIS — Z5181 Encounter for therapeutic drug level monitoring: Secondary | ICD-10-CM | POA: Diagnosis not present

## 2016-02-28 DIAGNOSIS — E1142 Type 2 diabetes mellitus with diabetic polyneuropathy: Secondary | ICD-10-CM | POA: Diagnosis not present

## 2016-03-26 ENCOUNTER — Ambulatory Visit (INDEPENDENT_AMBULATORY_CARE_PROVIDER_SITE_OTHER): Payer: Medicare HMO | Admitting: Gynecology

## 2016-03-26 ENCOUNTER — Encounter: Payer: Self-pay | Admitting: Gynecology

## 2016-03-26 VITALS — BP 138/76

## 2016-03-26 DIAGNOSIS — A499 Bacterial infection, unspecified: Secondary | ICD-10-CM | POA: Diagnosis not present

## 2016-03-26 DIAGNOSIS — Z23 Encounter for immunization: Secondary | ICD-10-CM | POA: Diagnosis not present

## 2016-03-26 DIAGNOSIS — N898 Other specified noninflammatory disorders of vagina: Secondary | ICD-10-CM | POA: Diagnosis not present

## 2016-03-26 DIAGNOSIS — R102 Pelvic and perineal pain: Secondary | ICD-10-CM | POA: Diagnosis not present

## 2016-03-26 DIAGNOSIS — B373 Candidiasis of vulva and vagina: Secondary | ICD-10-CM | POA: Diagnosis not present

## 2016-03-26 DIAGNOSIS — R300 Dysuria: Secondary | ICD-10-CM | POA: Diagnosis not present

## 2016-03-26 DIAGNOSIS — N760 Acute vaginitis: Secondary | ICD-10-CM

## 2016-03-26 DIAGNOSIS — B9689 Other specified bacterial agents as the cause of diseases classified elsewhere: Secondary | ICD-10-CM

## 2016-03-26 DIAGNOSIS — B3731 Acute candidiasis of vulva and vagina: Secondary | ICD-10-CM

## 2016-03-26 LAB — URINALYSIS W MICROSCOPIC + REFLEX CULTURE
BILIRUBIN URINE: NEGATIVE
CRYSTALS: NONE SEEN [HPF]
Casts: NONE SEEN [LPF]
Hgb urine dipstick: NEGATIVE
Ketones, ur: NEGATIVE
LEUKOCYTES UA: NEGATIVE
Nitrite: NEGATIVE
PH: 5.5 (ref 5.0–8.0)
Protein, ur: NEGATIVE
Specific Gravity, Urine: <1.005 (ref 1.001–1.035)
Yeast: NONE SEEN [HPF]

## 2016-03-26 LAB — WET PREP FOR TRICH, YEAST, CLUE
Trich, Wet Prep: NONE SEEN
WBC WET PREP: NONE SEEN

## 2016-03-26 MED ORDER — FLUCONAZOLE 150 MG PO TABS: 150.0000 mg | ORAL_TABLET | Freq: Once | ORAL | 0 refills | Status: AC

## 2016-03-26 MED ORDER — METRONIDAZOLE 500 MG PO TABS: 500.0000 mg | ORAL_TABLET | Freq: Two times a day (BID) | ORAL | 0 refills | Status: DC

## 2016-03-26 NOTE — Patient Instructions (Signed)
Metronidazole tablets or capsules What is this medicine? METRONIDAZOLE (me troe NI da zole) is an antiinfective. It is used to treat certain kinds of bacterial and protozoal infections. It will not work for colds, flu, or other viral infections. This medicine may be used for other purposes; ask your health care provider or pharmacist if you have questions. What should I tell my health care provider before I take this medicine? They need to know if you have any of these conditions: -anemia or other blood disorders -disease of the nervous system -fungal or yeast infection -if you drink alcohol containing drinks -liver disease -seizures -an unusual or allergic reaction to metronidazole, or other medicines, foods, dyes, or preservatives -pregnant or trying to get pregnant -breast-feeding How should I use this medicine? Take this medicine by mouth with a full glass of water. Follow the directions on the prescription label. Take your medicine at regular intervals. Do not take your medicine more often than directed. Take all of your medicine as directed even if you think you are better. Do not skip doses or stop your medicine early. Talk to your pediatrician regarding the use of this medicine in children. Special care may be needed. Overdosage: If you think you have taken too much of this medicine contact a poison control center or emergency room at once. NOTE: This medicine is only for you. Do not share this medicine with others. What if I miss a dose? If you miss a dose, take it as soon as you can. If it is almost time for your next dose, take only that dose. Do not take double or extra doses. What may interact with this medicine? Do not take this medicine with any of the following medications: -alcohol or any product that contains alcohol -amprenavir oral solution -cisapride -disulfiram -dofetilide -dronedarone -paclitaxel injection -pimozide -ritonavir oral solution -sertraline oral  solution -sulfamethoxazole-trimethoprim injection -thioridazine -ziprasidone This medicine may also interact with the following medications: -birth control pills -cimetidine -lithium -other medicines that prolong the QT interval (cause an abnormal heart rhythm) -phenobarbital -phenytoin -warfarin This list may not describe all possible interactions. Give your health care provider a list of all the medicines, herbs, non-prescription drugs, or dietary supplements you use. Also tell them if you smoke, drink alcohol, or use illegal drugs. Some items may interact with your medicine. What should I watch for while using this medicine? Tell your doctor or health care professional if your symptoms do not improve or if they get worse. You may get drowsy or dizzy. Do not drive, use machinery, or do anything that needs mental alertness until you know how this medicine affects you. Do not stand or sit up quickly, especially if you are an older patient. This reduces the risk of dizzy or fainting spells. Avoid alcoholic drinks while you are taking this medicine and for three days afterward. Alcohol may make you feel dizzy, sick, or flushed. If you are being treated for a sexually transmitted disease, avoid sexual contact until you have finished your treatment. Your sexual partner may also need treatment. What side effects may I notice from receiving this medicine? Side effects that you should report to your doctor or health care professional as soon as possible: -allergic reactions like skin rash or hives, swelling of the face, lips, or tongue -confusion, clumsiness -difficulty speaking -discolored or sore mouth -dizziness -fever, infection -numbness, tingling, pain or weakness in the hands or feet -trouble passing urine or change in the amount of urine -redness, blistering, peeling  or loosening of the skin, including inside the mouth -seizures -unusually weak or tired -vaginal irritation, dryness,  or discharge Side effects that usually do not require medical attention (report to your doctor or health care professional if they continue or are bothersome): -diarrhea -headache -irritability -metallic taste -nausea -stomach pain or cramps -trouble sleeping This list may not describe all possible side effects. Call your doctor for medical advice about side effects. You may report side effects to FDA at 1-800-FDA-1088. Where should I keep my medicine? Keep out of the reach of children. Store at room temperature below 25 degrees C (77 degrees F). Protect from light. Keep container tightly closed. Throw away any unused medicine after the expiration date. NOTE: This sheet is a summary. It may not cover all possible information. If you have questions about this medicine, talk to your doctor, pharmacist, or health care provider.    2016, Elsevier/Gold Standard. (2013-02-10 14:08:39) Fluconazole tablets What is this medicine? FLUCONAZOLE (floo KON na zole) is an antifungal medicine. It is used to treat certain kinds of fungal or yeast infections. This medicine may be used for other purposes; ask your health care provider or pharmacist if you have questions. What should I tell my health care provider before I take this medicine? They need to know if you have any of these conditions: -electrolyte abnormalities -history of irregular heart beat -kidney disease -an unusual or allergic reaction to fluconazole, other azole antifungals, medicines, foods, dyes, or preservatives -pregnant or trying to get pregnant -breast-feeding How should I use this medicine? Take this medicine by mouth. Follow the directions on the prescription label. Do not take your medicine more often than directed. Talk to your pediatrician regarding the use of this medicine in children. Special care may be needed. This medicine has been used in children as young as 62 months of age. Overdosage: If you think you have taken too  much of this medicine contact a poison control center or emergency room at once. NOTE: This medicine is only for you. Do not share this medicine with others. What if I miss a dose? If you miss a dose, take it as soon as you can. If it is almost time for your next dose, take only that dose. Do not take double or extra doses. What may interact with this medicine? Do not take this medicine with any of the following medications: -astemizole -certain medicines for irregular heart beat like dofetilide, dronedarone, quinidine -cisapride -erythromycin -lomitapide -other medicines that prolong the QT interval (cause an abnormal heart rhythm) -pimozide -terfenadine -thioridazine -tolvaptan -ziprasidone This medicine may also interact with the following medications: -antiviral medicines for HIV or AIDS -birth control pills -certain antibiotics like rifabutin, rifampin -certain medicines for blood pressure like amlodipine, isradipine, felodipine, hydrochlorothiazide, losartan, nifedipine -certain medicines for cancer like cyclophosphamide, vinblastine, vincristine -certain medicines for cholesterol like atorvastatin, lovastatin, fluvastatin, simvastatin -certain medicines for depression, anxiety, or psychotic disturbances like amitriptyline, midazolam, nortriptyline, triazolam -certain medicines for diabetes like glipizide, glyburide, tolbutamide -certain medicines for pain like alfentanil, fentanyl, methadone -certain medicines for seizures like carbamazepine, phenytoin -certain medicines that treat or prevent blood clots like warfarin -halofantrine -medicines that lower your chance of fighting infection like cyclosporine, prednisone, tacrolimus -NSAIDS, medicines for pain and inflammation, like celecoxib, diclofenac, flurbiprofen, ibuprofen, meloxicam, naproxen -other medicines for fungal infections -sirolimus -theophylline -tofacitinib This list may not describe all possible interactions.  Give your health care provider a list of all the medicines, herbs, non-prescription drugs, or dietary supplements you  use. Also tell them if you smoke, drink alcohol, or use illegal drugs. Some items may interact with your medicine. What should I watch for while using this medicine? Visit your doctor or health care professional for regular checkups. If you are taking this medicine for a long time you may need blood work. Tell your doctor if your symptoms do not improve. Some fungal infections need many weeks or months of treatment to cure. Alcohol can increase possible damage to your liver. Avoid alcoholic drinks. If you have a vaginal infection, do not have sex until you have finished your treatment. You can wear a sanitary napkin. Do not use tampons. Wear freshly washed cotton, not synthetic, panties. What side effects may I notice from receiving this medicine? Side effects that you should report to your doctor or health care professional as soon as possible: -allergic reactions like skin rash or itching, hives, swelling of the lips, mouth, tongue, or throat -dark urine -feeling dizzy or faint -irregular heartbeat or chest pain -redness, blistering, peeling or loosening of the skin, including inside the mouth -trouble breathing -unusual bruising or bleeding -vomiting -yellowing of the eyes or skin Side effects that usually do not require medical attention (report to your doctor or health care professional if they continue or are bothersome): -changes in how food tastes -diarrhea -headache -stomach upset or nausea This list may not describe all possible side effects. Call your doctor for medical advice about side effects. You may report side effects to FDA at 1-800-FDA-1088. Where should I keep my medicine? Keep out of the reach of children. Store at room temperature below 30 degrees C (86 degrees F). Throw away any medicine after the expiration date. NOTE: This sheet is a summary. It may  not cover all possible information. If you have questions about this medicine, talk to your doctor, pharmacist, or health care provider.    2016, Elsevier/Gold Standard. (2013-02-11 16:13:04) Monilial Vaginitis Vaginitis in a soreness, swelling and redness (inflammation) of the vagina and vulva. Monilial vaginitis is not a sexually transmitted infection. CAUSES  Yeast vaginitis is caused by yeast (candida) that is normally found in your vagina. With a yeast infection, the candida has overgrown in number to a point that upsets the chemical balance. SYMPTOMS   White, thick vaginal discharge.  Swelling, itching, redness and irritation of the vagina and possibly the lips of the vagina (vulva).  Burning or painful urination.  Painful intercourse. DIAGNOSIS  Things that may contribute to monilial vaginitis are:  Postmenopausal and virginal states.  Pregnancy.  Infections.  Being tired, sick or stressed, especially if you had monilial vaginitis in the past.  Diabetes. Good control will help lower the chance.  Birth control pills.  Tight fitting garments.  Using bubble bath, feminine sprays, douches or deodorant tampons.  Taking certain medications that kill germs (antibiotics).  Sporadic recurrence can occur if you become ill. TREATMENT  Your caregiver will give you medication.  There are several kinds of anti monilial vaginal creams and suppositories specific for monilial vaginitis. For recurrent yeast infections, use a suppository or cream in the vagina 2 times a week, or as directed.  Anti-monilial or steroid cream for the itching or irritation of the vulva may also be used. Get your caregiver's permission.  Painting the vagina with methylene blue solution may help if the monilial cream does not work.  Eating yogurt may help prevent monilial vaginitis. HOME CARE INSTRUCTIONS   Finish all medication as prescribed.  Do not have sex  until treatment is completed or after  your caregiver tells you it is okay.  Take warm sitz baths.  Do not douche.  Do not use tampons, especially scented ones.  Wear cotton underwear.  Avoid tight pants and panty hose.  Tell your sexual partner that you have a yeast infection. They should go to their caregiver if they have symptoms such as mild rash or itching.  Your sexual partner should be treated as well if your infection is difficult to eliminate.  Practice safer sex. Use condoms.  Some vaginal medications cause latex condoms to fail. Vaginal medications that harm condoms are:  Cleocin cream.  Butoconazole (Femstat).  Terconazole (Terazol) vaginal suppository.  Miconazole (Monistat) (may be purchased over the counter). SEEK MEDICAL CARE IF:   You have a temperature by mouth above 102 F (38.9 C).  The infection is getting worse after 2 days of treatment.  The infection is not getting better after 3 days of treatment.  You develop blisters in or around your vagina.  You develop vaginal bleeding, and it is not your menstrual period.  You have pain when you urinate.  You develop intestinal problems.  You have pain with sexual intercourse.   This information is not intended to replace advice given to you by your health care provider. Make sure you discuss any questions you have with your health care provider.   Document Released: 04/15/2005 Document Revised: 09/28/2011 Document Reviewed: 01/07/2015 Elsevier Interactive Patient Education 2016 [employer] Inc. Influenza Virus Vaccine (Flucelvax) What is this medicine? INFLUENZA VIRUS VACCINE (in floo EN zuh VAHY ruhs vak SEEN) helps to reduce the risk of getting influenza also known as the flu. The vaccine only helps protect you against some strains of the flu. This medicine may be used for other purposes; ask your health care provider or pharmacist if you have questions. What should I tell my health care provider before I take this medicine? They  need to know if you have any of these conditions: -bleeding disorder like hemophilia -fever or infection -Guillain-Barre syndrome or other neurological problems -immune system problems -infection with the human immunodeficiency virus (HIV) or AIDS -low blood platelet counts -multiple sclerosis -an unusual or allergic reaction to influenza virus vaccine, other medicines, foods, dyes or preservatives -pregnant or trying to get pregnant -breast-feeding How should I use this medicine? This vaccine is for injection into a muscle. It is given by a health care professional. A copy of Vaccine Information Statements will be given before each vaccination. Read this sheet carefully each time. The sheet may change frequently. Talk to your pediatrician regarding the use of this medicine in children. Special care may be needed. Overdosage: If you think you've taken too much of this medicine contact a poison control center or emergency room at once. Overdosage: If you think you have taken too much of this medicine contact a poison control center or emergency room at once. NOTE: This medicine is only for you. Do not share this medicine with others. What if I miss a dose? This does not apply. What may interact with this medicine? -chemotherapy or radiation therapy -medicines that lower your immune system like etanercept, anakinra, infliximab, and adalimumab -medicines that treat or prevent blood clots like warfarin -phenytoin -steroid medicines like prednisone or cortisone -theophylline -vaccines This list may not describe all possible interactions. Give your health care provider a list of all the medicines, herbs, non-prescription drugs, or dietary supplements you use. Also tell them if you smoke, drink alcohol,  or use illegal drugs. Some items may interact with your medicine. What should I watch for while using this medicine? Report any side effects that do not go away within 3 days to your doctor or  health care professional. Call your health care provider if any unusual symptoms occur within 6 weeks of receiving this vaccine. You may still catch the flu, but the illness is not usually as bad. You cannot get the flu from the vaccine. The vaccine will not protect against colds or other illnesses that may cause fever. The vaccine is needed every year. What side effects may I notice from receiving this medicine? Side effects that you should report to your doctor or health care professional as soon as possible: -allergic reactions like skin rash, itching or hives, swelling of the face, lips, or tongue Side effects that usually do not require medical attention (Report these to your doctor or health care professional if they continue or are bothersome.): -fever -headache -muscle aches and pains -pain, tenderness, redness, or swelling at the injection site -tiredness This list may not describe all possible side effects. Call your doctor for medical advice about side effects. You may report side effects to FDA at 1-800-FDA-1088. Where should I keep my medicine? The vaccine will be given by a health care professional in a clinic, pharmacy, doctor's office, or other health care setting. You will not be given vaccine doses to store at home. NOTE: This sheet is a summary. It may not cover all possible information. If you have questions about this medicine, talk to your doctor, pharmacist, or health care provider.    2016, Elsevier/Gold Standard. (2011-06-17 14:06:47)

## 2016-03-26 NOTE — Progress Notes (Signed)
   HPI: Patient is a 82 year old who was complaining of vulvar pruritus and a slight white discharge along with urinary frequency and dysuria and very mild back discomfort. Patient denied any fever, chills, nausea, vomiting or any change in appetite and no GI complaints. Patient also continues to complain of low mid abdominal discomfort. Review of her record indicated she's had history in the past 7 overactive bladder. Many years ago she had total abdominal hysterectomy with unilateral oophorectomy which was a true to heavy cycles and a fibroid uterus and a questionable cysts. She does not recall which ovary she has remaining. She is using vaginal estrogen twice a week for vaginal atrophy symptoms.   ROS: A ROS was performed and pertinent positives and negatives are included in the history.  GENERAL: No fevers or chills. HEENT: No change in vision, no earache, sore throat or sinus congestion. NECK: No pain or stiffness. CARDIOVASCULAR: No chest pain or pressure. No palpitations. PULMONARY: No shortness of breath, cough or wheeze. GASTROINTESTINAL: No abdominal pain, nausea, vomiting or diarrhea, melena or bright red blood per rectum. GENITOURINARY: See above MUSCULOSKELETAL: No joint or muscle pain, no back pain, no recent trauma. DERMATOLOGIC: No rash, no itching, no lesions. ENDOCRINE: No polyuria, polydipsia, no heat or cold intolerance. No recent change in weight. HEMATOLOGICAL: No anemia or easy bruising or bleeding. NEUROLOGIC: No headache, seizures, numbness, tingling or weakness. PSYCHIATRIC: No depression, no loss of interest in normal activity or change in sleep pattern.   PE: Blood pressure 138/76 Gen. appearance well-developed patient with the above-mentioned complaining no acute distress Back: No CVA tenderness Abdomen: Soft slightly tender suprapubically no rebound or guarding Pelvic: Bartholin urethra Skene was within atrophic changes Vagina: White discharge noted vaginal cuff  intact Bimanual exam no palpable masses only slightly tender suprapubically. Rectal exam: Not done  Wet prep moderate yeast, few clue cells, many bacteria  Urinalysis few bacteria, 0-5 WBC, 0-2 RBC 1+ glucose (patient on insulin type II diabetic)   Assessment Plan: Patient with clinical evidence of yeast vaginitis and bacterial vaginosis. She'll be given a prescription Diflucan 150 mg to take tomorrow morning she was instructed to hold off on her statin for 3 days before restarting. Also she'll be started on Flagyl 500 mg twice a day for 7 days. Because of her persistent low mid abdominal discomfort we'll going to schedule an ultrasound the next week or so to look at her remaining ovary. If she continues with a symptomatic the ultrasound is normal on going to refer her back to the urologist who had been treating her in the past for her overactive bladder. Patient received the flu vaccine today after she was counseled and literature information was provided.    Greater than 50% of time was spent in counseling and coordinating care of this patient.   Time of consultation: 15   Minutes.

## 2016-03-28 LAB — URINE CULTURE: Organism ID, Bacteria: NO GROWTH

## 2016-04-10 ENCOUNTER — Encounter: Payer: Self-pay | Admitting: Gynecology

## 2016-04-10 ENCOUNTER — Ambulatory Visit (INDEPENDENT_AMBULATORY_CARE_PROVIDER_SITE_OTHER): Payer: Medicare HMO | Admitting: Gynecology

## 2016-04-10 ENCOUNTER — Ambulatory Visit (INDEPENDENT_AMBULATORY_CARE_PROVIDER_SITE_OTHER): Payer: Medicare HMO

## 2016-04-10 VITALS — BP 138/70

## 2016-04-10 DIAGNOSIS — R102 Pelvic and perineal pain: Secondary | ICD-10-CM

## 2016-04-10 NOTE — Progress Notes (Signed)
\   Patient is an 82 year old who presented to the office today to discuss her ultrasound. She was seen in the office in September 7 see previous note one of her symptoms that she was experiencing was mild back discomfort along with dysuria frequency and lower abdominal pains. She was asymptomatic today she was treated for urinary tract infection and for a bacterial vaginosis and has done well.  Ultrasound today: Absent uterus right ovary normal echo atrophic. Left ovary not seen. No apparent adnexal masses no fluid in the cul-de-sac region and excessive bowel activity was noted.  Assessment/plan: Patient's lower abdominal discomfort may be attributed to urinary tract infection recently her ultrasound WAS normal. Patient otherwise scheduled to return back in one year for annual exam or when necessary.  Greater than 50% time was spent counseling coordinate care for this patient for consultation 10 minutes

## 2016-04-16 DIAGNOSIS — E039 Hypothyroidism, unspecified: Secondary | ICD-10-CM | POA: Diagnosis not present

## 2016-04-16 DIAGNOSIS — E119 Type 2 diabetes mellitus without complications: Secondary | ICD-10-CM | POA: Diagnosis not present

## 2016-04-16 DIAGNOSIS — I10 Essential (primary) hypertension: Secondary | ICD-10-CM | POA: Diagnosis not present

## 2016-04-16 DIAGNOSIS — Z794 Long term (current) use of insulin: Secondary | ICD-10-CM | POA: Diagnosis not present

## 2016-04-16 DIAGNOSIS — Z0000 Encounter for general adult medical examination without abnormal findings: Secondary | ICD-10-CM | POA: Diagnosis not present

## 2016-04-16 DIAGNOSIS — M810 Age-related osteoporosis without current pathological fracture: Secondary | ICD-10-CM | POA: Diagnosis not present

## 2016-04-16 DIAGNOSIS — K219 Gastro-esophageal reflux disease without esophagitis: Secondary | ICD-10-CM | POA: Diagnosis not present

## 2016-04-20 DIAGNOSIS — M25571 Pain in right ankle and joints of right foot: Secondary | ICD-10-CM | POA: Diagnosis not present

## 2016-04-20 DIAGNOSIS — M65871 Other synovitis and tenosynovitis, right ankle and foot: Secondary | ICD-10-CM | POA: Diagnosis not present

## 2016-05-19 DIAGNOSIS — E1140 Type 2 diabetes mellitus with diabetic neuropathy, unspecified: Secondary | ICD-10-CM | POA: Diagnosis not present

## 2016-05-19 DIAGNOSIS — E1165 Type 2 diabetes mellitus with hyperglycemia: Secondary | ICD-10-CM | POA: Diagnosis not present

## 2016-05-22 DIAGNOSIS — K5900 Constipation, unspecified: Secondary | ICD-10-CM | POA: Diagnosis not present

## 2016-05-22 DIAGNOSIS — E1140 Type 2 diabetes mellitus with diabetic neuropathy, unspecified: Secondary | ICD-10-CM | POA: Diagnosis not present

## 2016-05-22 DIAGNOSIS — M150 Primary generalized (osteo)arthritis: Secondary | ICD-10-CM | POA: Diagnosis not present

## 2016-05-22 DIAGNOSIS — M810 Age-related osteoporosis without current pathological fracture: Secondary | ICD-10-CM | POA: Diagnosis not present

## 2016-05-22 DIAGNOSIS — E039 Hypothyroidism, unspecified: Secondary | ICD-10-CM | POA: Diagnosis not present

## 2016-05-22 DIAGNOSIS — R42 Dizziness and giddiness: Secondary | ICD-10-CM | POA: Diagnosis not present

## 2016-05-22 DIAGNOSIS — I10 Essential (primary) hypertension: Secondary | ICD-10-CM | POA: Diagnosis not present

## 2016-05-22 DIAGNOSIS — N182 Chronic kidney disease, stage 2 (mild): Secondary | ICD-10-CM | POA: Diagnosis not present

## 2016-05-22 DIAGNOSIS — E782 Mixed hyperlipidemia: Secondary | ICD-10-CM | POA: Diagnosis not present

## 2016-05-22 DIAGNOSIS — K219 Gastro-esophageal reflux disease without esophagitis: Secondary | ICD-10-CM | POA: Diagnosis not present

## 2016-05-28 DIAGNOSIS — M170 Bilateral primary osteoarthritis of knee: Secondary | ICD-10-CM | POA: Diagnosis not present

## 2016-06-01 DIAGNOSIS — M65871 Other synovitis and tenosynovitis, right ankle and foot: Secondary | ICD-10-CM | POA: Diagnosis not present

## 2016-06-01 DIAGNOSIS — M25571 Pain in right ankle and joints of right foot: Secondary | ICD-10-CM | POA: Diagnosis not present

## 2016-06-10 ENCOUNTER — Emergency Department (HOSPITAL_COMMUNITY): Payer: Medicare HMO

## 2016-06-10 ENCOUNTER — Encounter (HOSPITAL_COMMUNITY): Payer: Self-pay | Admitting: Physician Assistant

## 2016-06-10 ENCOUNTER — Observation Stay (HOSPITAL_COMMUNITY)
Admission: EM | Admit: 2016-06-10 | Discharge: 2016-06-11 | Disposition: A | Discharge status: Home / Self Care | Payer: Medicare HMO | Attending: Internal Medicine | Admitting: Internal Medicine

## 2016-06-10 DIAGNOSIS — D649 Anemia, unspecified: Secondary | ICD-10-CM | POA: Diagnosis not present

## 2016-06-10 DIAGNOSIS — E784 Other hyperlipidemia: Secondary | ICD-10-CM | POA: Diagnosis not present

## 2016-06-10 DIAGNOSIS — Z794 Long term (current) use of insulin: Secondary | ICD-10-CM

## 2016-06-10 DIAGNOSIS — R51 Headache: Secondary | ICD-10-CM | POA: Diagnosis not present

## 2016-06-10 DIAGNOSIS — R410 Disorientation, unspecified: Secondary | ICD-10-CM

## 2016-06-10 DIAGNOSIS — E039 Hypothyroidism, unspecified: Secondary | ICD-10-CM | POA: Diagnosis not present

## 2016-06-10 DIAGNOSIS — G4700 Insomnia, unspecified: Secondary | ICD-10-CM | POA: Insufficient documentation

## 2016-06-10 DIAGNOSIS — K219 Gastro-esophageal reflux disease without esophagitis: Secondary | ICD-10-CM | POA: Diagnosis not present

## 2016-06-10 DIAGNOSIS — I10 Essential (primary) hypertension: Secondary | ICD-10-CM | POA: Diagnosis not present

## 2016-06-10 DIAGNOSIS — A084 Viral intestinal infection, unspecified: Secondary | ICD-10-CM

## 2016-06-10 DIAGNOSIS — Z881 Allergy status to other antibiotic agents status: Secondary | ICD-10-CM | POA: Diagnosis not present

## 2016-06-10 DIAGNOSIS — G4489 Other headache syndrome: Secondary | ICD-10-CM | POA: Diagnosis not present

## 2016-06-10 DIAGNOSIS — Z882 Allergy status to sulfonamides status: Secondary | ICD-10-CM | POA: Diagnosis not present

## 2016-06-10 DIAGNOSIS — R4182 Altered mental status, unspecified: Secondary | ICD-10-CM | POA: Diagnosis not present

## 2016-06-10 DIAGNOSIS — E118 Type 2 diabetes mellitus with unspecified complications: Secondary | ICD-10-CM

## 2016-06-10 DIAGNOSIS — R824 Acetonuria: Secondary | ICD-10-CM | POA: Diagnosis not present

## 2016-06-10 DIAGNOSIS — E872 Acidosis: Secondary | ICD-10-CM

## 2016-06-10 DIAGNOSIS — R402441 Other coma, without documented Glasgow coma scale score, or with partial score reported, in the field [EMT or ambulance]: Secondary | ICD-10-CM | POA: Diagnosis not present

## 2016-06-10 DIAGNOSIS — E119 Type 2 diabetes mellitus without complications: Secondary | ICD-10-CM | POA: Diagnosis not present

## 2016-06-10 DIAGNOSIS — I700 Atherosclerosis of aorta: Secondary | ICD-10-CM | POA: Diagnosis not present

## 2016-06-10 DIAGNOSIS — G9340 Encephalopathy, unspecified: Secondary | ICD-10-CM | POA: Insufficient documentation

## 2016-06-10 DIAGNOSIS — E785 Hyperlipidemia, unspecified: Secondary | ICD-10-CM | POA: Diagnosis not present

## 2016-06-10 DIAGNOSIS — K002 Abnormalities of size and form of teeth: Secondary | ICD-10-CM | POA: Insufficient documentation

## 2016-06-10 DIAGNOSIS — E860 Dehydration: Secondary | ICD-10-CM | POA: Diagnosis not present

## 2016-06-10 HISTORY — DX: Pure hypercholesterolemia, unspecified: E78.00

## 2016-06-10 HISTORY — DX: Hypothyroidism, unspecified: E03.9

## 2016-06-10 HISTORY — DX: Type 2 diabetes mellitus without complications: E11.9

## 2016-06-10 HISTORY — DX: Low back pain, unspecified: M54.50

## 2016-06-10 HISTORY — DX: Other chronic pain: G89.29

## 2016-06-10 HISTORY — DX: Unspecified osteoarthritis, unspecified site: M19.90

## 2016-06-10 HISTORY — DX: Chronic kidney disease, unspecified: N18.9

## 2016-06-10 HISTORY — DX: Low back pain: M54.5

## 2016-06-10 LAB — URINALYSIS, ROUTINE W REFLEX MICROSCOPIC
BILIRUBIN URINE: NEGATIVE
GLUCOSE, UA: 500 mg/dL — AB
HGB URINE DIPSTICK: NEGATIVE
KETONES UR: 15 mg/dL — AB
Leukocytes, UA: NEGATIVE
Nitrite: NEGATIVE
PH: 7.5 (ref 5.0–8.0)
Protein, ur: 30 mg/dL — AB
SPECIFIC GRAVITY, URINE: 1.016 (ref 1.005–1.030)

## 2016-06-10 LAB — CBC WITH DIFFERENTIAL/PLATELET
BASOS ABS: 0 10*3/uL (ref 0.0–0.1)
BASOS PCT: 0 %
Eosinophils Absolute: 0 10*3/uL (ref 0.0–0.7)
Eosinophils Relative: 0 %
HEMATOCRIT: 33 % — AB (ref 36.0–46.0)
HEMOGLOBIN: 11.3 g/dL — AB (ref 12.0–15.0)
LYMPHS PCT: 5 %
Lymphs Abs: 0.5 10*3/uL — ABNORMAL LOW (ref 0.7–4.0)
MCH: 30.7 pg (ref 26.0–34.0)
MCHC: 34.2 g/dL (ref 30.0–36.0)
MCV: 89.7 fL (ref 78.0–100.0)
MONO ABS: 0.3 10*3/uL (ref 0.1–1.0)
MONOS PCT: 3 %
NEUTROS ABS: 9.5 10*3/uL — AB (ref 1.7–7.7)
NEUTROS PCT: 92 %
Platelets: 190 10*3/uL (ref 150–400)
RBC: 3.68 MIL/uL — ABNORMAL LOW (ref 3.87–5.11)
RDW: 13 % (ref 11.5–15.5)
WBC: 10.3 10*3/uL (ref 4.0–10.5)

## 2016-06-10 LAB — COMPREHENSIVE METABOLIC PANEL
ALBUMIN: 4 g/dL (ref 3.5–5.0)
ALK PHOS: 52 U/L (ref 38–126)
ALT: 17 U/L (ref 14–54)
AST: 26 U/L (ref 15–41)
Anion gap: 12 (ref 5–15)
BILIRUBIN TOTAL: 0.5 mg/dL (ref 0.3–1.2)
BUN: 15 mg/dL (ref 6–20)
CALCIUM: 10.4 mg/dL — AB (ref 8.9–10.3)
CO2: 25 mmol/L (ref 22–32)
Chloride: 94 mmol/L — ABNORMAL LOW (ref 101–111)
Creatinine, Ser: 0.97 mg/dL (ref 0.44–1.00)
GFR calc Af Amer: >60 mL/min (ref >60)
GFR, EST NON AFRICAN AMERICAN: 54 mL/min — AB (ref >60)
GLUCOSE: 279 mg/dL — AB (ref 65–99)
Potassium: 4.7 mmol/L (ref 3.5–5.1)
Sodium: 131 mmol/L — ABNORMAL LOW (ref 135–145)
TOTAL PROTEIN: 7 g/dL (ref 6.5–8.1)

## 2016-06-10 LAB — RAPID URINE DRUG SCREEN, HOSP PERFORMED
AMPHETAMINES: NOT DETECTED
Barbiturates: NOT DETECTED
Benzodiazepines: NOT DETECTED
COCAINE: NOT DETECTED
OPIATES: NOT DETECTED
Tetrahydrocannabinol: NOT DETECTED

## 2016-06-10 LAB — I-STAT TROPONIN, ED: Troponin i, poc: 0 ng/mL (ref 0.00–0.08)

## 2016-06-10 LAB — TSH: TSH: 0.445 u[IU]/mL (ref 0.350–4.500)

## 2016-06-10 LAB — AMMONIA: Ammonia: 22 umol/L (ref 9–35)

## 2016-06-10 LAB — I-STAT CG4 LACTIC ACID, ED
LACTIC ACID, VENOUS: 2.37 mmol/L — AB (ref 0.5–1.9)
LACTIC ACID, VENOUS: 2.48 mmol/L — AB (ref 0.5–1.9)

## 2016-06-10 LAB — GLUCOSE, CAPILLARY
GLUCOSE-CAPILLARY: 360 mg/dL — AB (ref 65–99)
Glucose-Capillary: 265 mg/dL — ABNORMAL HIGH (ref 65–99)
Glucose-Capillary: 304 mg/dL — ABNORMAL HIGH (ref 65–99)

## 2016-06-10 LAB — URINE MICROSCOPIC-ADD ON

## 2016-06-10 LAB — LACTIC ACID, PLASMA
LACTIC ACID, VENOUS: 2.8 mmol/L — AB (ref 0.5–1.9)
Lactic Acid, Venous: 1.9 mmol/L (ref 0.5–1.9)

## 2016-06-10 MED ORDER — ONDANSETRON HCL 4 MG PO TABS: 4.0000 mg | ORAL_TABLET | Freq: Four times a day (QID) | ORAL | Status: DC | PRN

## 2016-06-10 MED ORDER — ASPIRIN EC 81 MG PO TBEC
81.0000 mg | DELAYED_RELEASE_TABLET | Freq: Every day | ORAL | Status: DC
  Administered 2016-06-10 – 2016-06-11 (×2): 81 mg via ORAL
  Filled 2016-06-10 (×2): qty 1

## 2016-06-10 MED ORDER — PANTOPRAZOLE SODIUM 40 MG PO TBEC
40.0000 mg | DELAYED_RELEASE_TABLET | Freq: Every day | ORAL | Status: DC
  Administered 2016-06-11: 40 mg via ORAL
  Filled 2016-06-10: qty 1

## 2016-06-10 MED ORDER — ENOXAPARIN SODIUM 40 MG/0.4ML ~~LOC~~ SOLN
40.0000 mg | SUBCUTANEOUS | Status: DC
  Administered 2016-06-10: 40 mg via SUBCUTANEOUS
  Filled 2016-06-10: qty 0.4

## 2016-06-10 MED ORDER — ACETAMINOPHEN 325 MG PO TABS: 650.0000 mg | ORAL_TABLET | Freq: Four times a day (QID) | ORAL | Status: DC | PRN

## 2016-06-10 MED ORDER — ACETAMINOPHEN 650 MG RE SUPP: 650.0000 mg | Freq: Four times a day (QID) | RECTAL | Status: DC | PRN

## 2016-06-10 MED ORDER — SODIUM CHLORIDE 0.9 % IV SOLN: INTRAVENOUS | Status: DC

## 2016-06-10 MED ORDER — INSULIN ASPART 100 UNIT/ML ~~LOC~~ SOLN
5.0000 [IU] | Freq: Once | SUBCUTANEOUS | Status: AC
  Administered 2016-06-11: 5 [IU] via SUBCUTANEOUS

## 2016-06-10 MED ORDER — SENNOSIDES-DOCUSATE SODIUM 8.6-50 MG PO TABS: 1.0000 | ORAL_TABLET | Freq: Every evening | ORAL | Status: DC | PRN

## 2016-06-10 MED ORDER — SODIUM CHLORIDE 0.9 % IV BOLUS (SEPSIS)
1000.0000 mL | Freq: Once | INTRAVENOUS | Status: AC
  Administered 2016-06-10: 1000 mL via INTRAVENOUS

## 2016-06-10 MED ORDER — MAGNESIUM CITRATE PO SOLN: 1.0000 | Freq: Once | ORAL | Status: DC | PRN

## 2016-06-10 MED ORDER — SODIUM CHLORIDE 0.9 % IV SOLN
INTRAVENOUS | Status: DC
  Administered 2016-06-10: 14:00:00 via INTRAVENOUS

## 2016-06-10 MED ORDER — ONDANSETRON HCL 4 MG/2ML IJ SOLN: 4.0000 mg | Freq: Four times a day (QID) | INTRAMUSCULAR | Status: DC | PRN

## 2016-06-10 MED ORDER — INSULIN ASPART 100 UNIT/ML ~~LOC~~ SOLN
0.0000 [IU] | Freq: Three times a day (TID) | SUBCUTANEOUS | Status: DC
  Administered 2016-06-10: 7 [IU] via SUBCUTANEOUS
  Administered 2016-06-11: 5 [IU] via SUBCUTANEOUS

## 2016-06-10 MED ORDER — TRAZODONE HCL 50 MG PO TABS: 25.0000 mg | ORAL_TABLET | Freq: Every evening | ORAL | Status: DC | PRN

## 2016-06-10 MED ORDER — SODIUM CHLORIDE 0.9 % IV BOLUS (SEPSIS)
500.0000 mL | Freq: Once | INTRAVENOUS | Status: AC
  Administered 2016-06-10: 500 mL via INTRAVENOUS

## 2016-06-10 MED ORDER — DEXTROSE-NACL 5-0.9 % IV SOLN: INTRAVENOUS | Status: DC

## 2016-06-10 MED ORDER — LEVOTHYROXINE SODIUM 100 MCG PO TABS
100.0000 ug | ORAL_TABLET | Freq: Every day | ORAL | Status: DC
  Administered 2016-06-11: 100 ug via ORAL
  Filled 2016-06-10: qty 1

## 2016-06-10 MED ORDER — BISACODYL 10 MG RE SUPP: 10.0000 mg | Freq: Every day | RECTAL | Status: DC | PRN

## 2016-06-10 MED ORDER — HYDROCODONE-ACETAMINOPHEN 5-325 MG PO TABS: 1.0000 | ORAL_TABLET | ORAL | Status: DC | PRN

## 2016-06-10 NOTE — H&P (Signed)
History and Physical    [name] [name] [id number] DOB: 1933/07/24 DOA: 06/10/2016   PCP: No primary care provider on file.   Patient coming from:  Home  Chief Complaint: Confusion   HPI: [name] [name] is a 82 y.o. female  brought with acute confusion. According to daughter's report, this confusion was preceded by several days of news nonbloody diarrhea. The patient had poor oral intake, as well as decreased liquid intake. Over the last 24 hours, she had increased generalized weakness and confusion. There was no reported lateral motor deficiencies . According to the family, the patient is normally alert, oriented, and is able to perform activities of daily living. However today, she has not been able to do so. No apartment fevers, or sick contacts. No apartment chest pain or palpitations, or increasing shortness of breath. No apartment dizziness. No apartment abdominal pain. Sydell Axon reports her urine has been darker, and has some urinary discomfort, without urinary frequency. No recent long distance trip.  ED Course:  BP 148/64   Pulse 84   Temp 98.7 F (37.1 C) (Oral)   Resp 13   Ht 5\' 8"  (1.727 m)   Wt 68 kg (150 lb)   SpO2 100%   BMI 22.81 kg/m    WBC 11.3, Na 131, Ca 10.4. LA 3.7  VSS. UA with ketones but neg for bacteria or nitriites   CXR neg . Received 2 L IVF    Review of Systems: As per HPI otherwise 10 point review of systems negative which has been obtained by family   Past Medical History:  Diagnosis Date  . Diabetes mellitus without complication ([place])   . GERD (gastroesophageal reflux disease)   . Hypothyroidism     Past Surgical History:  Procedure Laterality Date  . NO PAST SURGERIES      Social History Social History   Social History  . Marital status: N/A    Spouse name: N/A  . Number of children: N/A  . Years of education: N/A   Occupational History  . Not on file.   Social History Main Topics  . Smoking status: Unknown If Ever Smoked  .  Smokeless tobacco: Former Systems developer  . Alcohol use No  . Drug use: No  . Sexual activity: No   Other Topics Concern  . Not on file   Social History Narrative  . No narrative on file     Allergies not on file  History reviewed. No pertinent family history.    Prior to Admission medications   Not on File    Physical Exam:    Vitals:   06/10/16 1300 06/10/16 1315 06/10/16 1330 06/10/16 1345  BP: 142/55 150/57 152/65 148/64  Pulse: 79 81 83 84  Resp: 11 19 13 13   Temp:      TempSrc:      SpO2: 99% 99% 100% 100%  Weight:      Height:           Constitutional: NAD, calm, comfortable  Vitals:   06/10/16 1300 06/10/16 1315 06/10/16 1330 06/10/16 1345  BP: 142/55 150/57 152/65 148/64  Pulse: 79 81 83 84  Resp: 11 19 13 13   Temp:      TempSrc:      SpO2: 99% 99% 100% 100%  Weight:      Height:       Eyes: PERRL, lids and conjunctivae normal ENMT: Mucous membranes are dry . Posterior pharynx clear of any exudate or lesions.edentulous  Neck: normal, supple,  no masses, no thyromegaly Respiratory: clear to auscultation bilaterally, no wheezing, no crackles. Normal respiratory effort. No accessory muscle use.  Cardiovascular: Regular rate and rhythm, no murmurs / rubs / gallops. No extremity edema. 2+ pedal pulses. No carotid bruits.  Abdomen: no tenderness, no masses palpated. No hepatosplenomegaly. Bowel sounds positive.  Musculoskeletal: no clubbing / cyanosis. No joint deformity upper and lower extremities. Good ROM, no contractures. Normal muscle tone.  Skin: no rashes, lesions, ulcers.  Neurologic: CN 2-12 grossly intact. Sensation intact, DTR normal. Strength 5/5 in all 4.  Psychiatri  confused    Labs on Admission: I have personally reviewed following labs and imaging studies  CBC:  Recent Labs Lab 06/10/16 1025  WBC 10.3  NEUTROABS 9.5*  HGB 11.3*  HCT 33.0*  MCV 89.7  PLT 99991111    Basic Metabolic Panel:  Recent Labs Lab 06/10/16 1025  NA 131*    K 4.7  CL 94*  CO2 25  GLUCOSE 279*  BUN 15  CREATININE 0.97  CALCIUM 10.4*    GFR: Estimated Creatinine Clearance: 46.7 mL/min (by C-G formula based on SCr of 0.97 mg/dL).  Liver Function Tests:  Recent Labs Lab 06/10/16 1025  AST 26  ALT 17  ALKPHOS 52  BILITOT 0.5  PROT 7.0  ALBUMIN 4.0   No results for input(s): LIPASE, AMYLASE in the last 168 hours. No results for input(s): AMMONIA in the last 168 hours.  Coagulation Profile: No results for input(s): INR, PROTIME in the last 168 hours.  Cardiac Enzymes: No results for input(s): CKTOTAL, CKMB, CKMBINDEX, TROPONINI in the last 168 hours.  BNP (last 3 results) No results for input(s): PROBNP in the last 8760 hours.  HbA1C: No results for input(s): HGBA1C in the last 72 hours.  CBG: No results for input(s): GLUCAP in the last 168 hours.  Lipid Profile: No results for input(s): CHOL, HDL, LDLCALC, TRIG, CHOLHDL, LDLDIRECT in the last 72 hours.  Thyroid Function Tests: No results for input(s): TSH, T4TOTAL, FREET4, T3FREE, THYROIDAB in the last 72 hours.  Anemia Panel: No results for input(s): VITAMINB12, FOLATE, FERRITIN, TIBC, IRON, RETICCTPCT in the last 72 hours.  Urine analysis:    Component Value Date/Time   COLORURINE YELLOW 06/10/2016 1128   APPEARANCEUR CLEAR 06/10/2016 1128   LABSPEC 1.016 06/10/2016 1128   PHURINE 7.5 06/10/2016 1128   GLUCOSEU 500 (A) 06/10/2016 1128   HGBUR NEGATIVE 06/10/2016 1128   BILIRUBINUR NEGATIVE 06/10/2016 1128   KETONESUR 15 (A) 06/10/2016 1128   PROTEINUR 30 (A) 06/10/2016 1128   NITRITE NEGATIVE 06/10/2016 1128   LEUKOCYTESUR NEGATIVE 06/10/2016 1128    Sepsis Labs: @LABRCNTIP (procalcitonin:4,lacticidven:4) )No results found for this or any previous visit (from the past 240 hour(s)).   Radiological Exams on Admission: Dg Chest 2 View  Result Date: 06/10/2016 CLINICAL DATA:  Altered mental status EXAM: CHEST  2 VIEW COMPARISON:  None. FINDINGS: There  is no edema or consolidation. Heart size and pulmonary vascularity are normal. No adenopathy. There is atherosclerotic calcification in the aortic arch region. There is calcific tendinosis adjacent to the lateral aspect of the right proximal humerus. IMPRESSION: No edema or consolidation.  Aortic atherosclerosis. Electronically Signed   By: Lowella Grip III M.D.   On: 06/10/2016 11:06   Ct Head Wo Contrast  Result Date: 06/10/2016 CLINICAL DATA:  Confusion, left temporal headache, found lying on the floor EXAM: CT HEAD WITHOUT CONTRAST TECHNIQUE: Contiguous axial images were obtained from the base of the skull through the vertex  without intravenous contrast. COMPARISON:  None. FINDINGS: Brain: Ventricular system is prominent as are the cortical sulci diffusely, consistent with atrophy. The septum is midline in position. The fourth ventricle and basilar cisterns are unremarkable. Moderate small vessel ischemic change is present throughout the periventricular white matter. No hemorrhage, mass lesion, or acute infarction is seen. Vascular: No vascular abnormality is seen on this unenhanced study. Skull: No calvarial abnormality is noted. Sinuses/Orbits: The paranasal sinuses that are visualized appear pneumatized. Other: None IMPRESSION: 1. No acute intracranial abnormality. 2. Atrophy and moderate small vessel ischemic change. Electronically Signed   By: Ivar Drape M.D.   On: 06/10/2016 11:01    EKG: Independently reviewed.  Assessment/Plan Active Problems:   Confusion   Severe dehydration   Hypertension   Hyperlipidemia   GERD (gastroesophageal reflux disease)   Insomnia   Diabetes ([place])    Acute Confusional State likely due  Dehydration due to acute volume loss  CT head is negative for acute intracranial abnormalities. No seizures noted Afebrile. WBC 11.3, Na 131, Ca 10.4. LA 3.7  VSS. UA with ketones but neg for bacteria or nitriites   CXR neg . Received 2 L IVF Admit to med-surg   Urine  Drug Screen A1C Lactic acid TSH, BCx  And U Cx   CBC and CMET in am  Ammonia levels  PT/OT  Type II Diabetes Current blood sugar level is 279 No results found for: HGBA1C Hgb A1C Hold home Metformin   SSI Heart healthy carb modified diet.   Hypothyroidism on Synthroid 100 mcg qd  Check TSH Cont Synthroid   Hypertension BP 148/64   Pulse 84   Hold Lasix 20 mg daily , Cozaar 25 mg daily  GERD Continue Protonix 40 mg qd   Hyperlipidemia Continue home with Zocor 40 mg qhs      DVT prophylaxis: Lovenox  Code Status:   Full     Family Communication:  Discussed with patient daughter Disposition Plan: Expect patient to be discharged to home after condition improves Consults called:    None Admission status:  Obs  Medsurg   Rondel Jumbo, PA-C [facility]   06/10/2016, 2:45 PM

## 2016-06-10 NOTE — ED Triage Notes (Signed)
Per [facility], Pt is coming from home with complaints of AMS per daughter. Daughter talked with patient on the phone last night at 2200 and was at baseline. This morning at 0900, Pt was found by daughter in the floor with AMS. Aware of daughter and self, but confused to situation and time. Denies pain besides headache. No injury noted from fall. Hx of High cholesterol, HTN, and UTIs. Abnormal gait noted, but reported to be baseline per daughter. Pt complains of frequent urination and burning with urination. Vitals per EMS 168/70, 91 HR, 98%, 245 CBG, 16 RR. 22 Gauge IV in R FA.

## 2016-06-10 NOTE — Progress Notes (Addendum)
NURSING PROGRESS NOTE  [name] [name] [id number] Admission Data: 06/10/2016 2:53 PM Attending Provider: No att. providers found PCP:No primary care provider on file. Code Status: FULL  [name] [name] is a 82 y.o. female patient admitted from ED:  -No acute distress noted.  -No complaints of shortness of breath.  -No complaints of chest pain.   Cardiac Monitoring: N/A  Blood pressure 148/64, pulse 84, temperature 98.7 F (37.1 C), temperature source Oral, resp. rate 13, height 5\' 8"  (1.727 m), weight 68 kg (150 lb), SpO2 100 %.   IV Fluids:  IV in place, occlusive dsg intact without redness, IV cath forearm right, condition patent and no redness normal saline.   Allergies:  Patient has no allergy information on record.  Past Medical History:   has a past medical history of Diabetes mellitus without complication ([place]); GERD (gastroesophageal reflux disease); and Hypothyroidism.  Past Surgical History:   has a past surgical history that includes No past surgeries.  Social History:   has an unknown smoking status. She has quit using smokeless tobacco. She reports that she does not drink alcohol or use drugs.  Skin: Right knee scab noted.   Patient/Family orientated to room. Information packet given to patient/family. Admission inpatient armband information verified with patient/family to include name and date of birth and placed on patient arm. Side rails up x 2, fall assessment and education completed with patient/family. Patient/family able to verbalize understanding of risk associated with falls and verbalized understanding to call for assistance before getting out of bed. Call light within reach. Patient/family able to voice and demonstrate understanding of unit orientation instructions.    Will continue to evaluate and treat per MD orders.  Charolette Child, RN

## 2016-06-10 NOTE — ED Notes (Signed)
Pt taken to CT.

## 2016-06-10 NOTE — Progress Notes (Signed)
CRITICAL VALUE ALERT  Critical value received:  Lactic 2.8   Date of notification:  06/10/16  Time of notification:  1951  Critical value read back:Yes.    Nurse who received alert:  Doroteo Glassman  MD notified (1st page):  Tylene Fantasia   Time of first page:  2048  MD notified (2nd page):  Time of second page:  Responding MD:  Tylene Fantasia   Time MD responded:  2054

## 2016-06-10 NOTE — ED Provider Notes (Signed)
[facility] DEPT Provider Note   CSN: [id number] Arrival date & time: 06/10/16  1000     History   Chief Complaint Chief Complaint  Patient presents with  . Altered Mental Status    HPI [name] [name] is a 82 y.o. female.  HPI 82 year old female with past medical history as above who presents with altered mental status. According to the patient's report and daughter's report, patient has had several days of loose, nonbloody diarrhea. She has not been eating and drinking as much as she normally does. Over the last 24 hours, patient has had an increase in generalized weakness as well as confusion. She has been asking repetitive questions and is having difficulty getting around her house. According to family, patient is normally alert and oriented 3 and drives herself as well as lives alone. Patient denies any complaints but does admit to intermittent confusion. Her blood sugar has been elevated at home. Denies any fevers. Denies any current abdominal pain. Of note, she does note that her urine appears darker and has some urinary discomfort. Denies any urinary frequency.  No past medical history on file.  There are no active problems to display for this patient.   No past surgical history on file.  OB History    No data available       Home Medications    Prior to Admission medications   Not on File    Family History No family history on file.  Social History Social History  Substance Use Topics  . Smoking status: Not on file  . Smokeless tobacco: Not on file  . Alcohol use Not on file     Allergies   Patient has no allergy information on record.   Review of Systems Review of Systems  Constitutional: Positive for appetite change and fatigue. Negative for chills and fever.  HENT: Negative for congestion and rhinorrhea.   Eyes: Negative for visual disturbance.  Respiratory: Negative for cough, shortness of breath and wheezing.   Cardiovascular: Negative for  chest pain and leg swelling.  Gastrointestinal: Positive for diarrhea. Negative for abdominal pain, nausea and vomiting.  Genitourinary: Negative for dysuria and flank pain.  Musculoskeletal: Negative for neck pain and neck stiffness.  Skin: Negative for rash and wound.  Allergic/Immunologic: Negative for immunocompromised state.  Neurological: Negative for syncope, weakness and headaches.  All other systems reviewed and are negative.    Physical Exam Updated Vital Signs BP (!) 144/52   Pulse 80   Temp 98.7 F (37.1 C) (Oral)   Resp 14   Ht 5\' 8"  (1.727 m)   Wt 150 lb (68 kg)   SpO2 99%   BMI 22.81 kg/m   Physical Exam  Constitutional: She appears well-developed and well-nourished. No distress.  HENT:  Head: Normocephalic and atraumatic.  Dry MM  Eyes: Conjunctivae are normal.  Neck: Neck supple.  Cardiovascular: Normal rate, regular rhythm and normal heart sounds.  Exam reveals no friction rub.   No murmur heard. Pulmonary/Chest: Effort normal and breath sounds normal. No respiratory distress. She has no wheezes. She has no rales.  Abdominal: Soft. She exhibits no distension. There is no tenderness. There is no rebound and no guarding.  Musculoskeletal: She exhibits no edema.  Neurological: She is alert. She exhibits normal muscle tone.  Oriented to person and place only. MAE with 5/5 strength. Normal sensation to light touch proximally and distally b/l. No CN deficits.  Skin: Skin is warm. Capillary refill takes less than 2 seconds.  Psychiatric: She has a normal mood and affect.  Nursing note and vitals reviewed.    ED Treatments / Results  Labs (all labs ordered are listed, but only abnormal results are displayed) Labs Reviewed  CBC WITH DIFFERENTIAL/PLATELET - Abnormal; Notable for the following:       Result Value   RBC 3.68 (*)    Hemoglobin 11.3 (*)    HCT 33.0 (*)    Neutro Abs 9.5 (*)    Lymphs Abs 0.5 (*)    All other components within normal limits    COMPREHENSIVE METABOLIC PANEL - Abnormal; Notable for the following:    Sodium 131 (*)    Chloride 94 (*)    Glucose, Bld 279 (*)    Calcium 10.4 (*)    GFR calc non Af Amer 54 (*)    All other components within normal limits  URINALYSIS, ROUTINE W REFLEX MICROSCOPIC (NOT AT [facility]) - Abnormal; Notable for the following:    Glucose, UA 500 (*)    Ketones, ur 15 (*)    Protein, ur 30 (*)    All other components within normal limits  URINE MICROSCOPIC-ADD ON - Abnormal; Notable for the following:    Squamous Epithelial / LPF 0-5 (*)    Bacteria, UA RARE (*)    All other components within normal limits  I-STAT CG4 LACTIC ACID, ED - Abnormal; Notable for the following:    Lactic Acid, Venous 2.37 (*)    All other components within normal limits  CULTURE, BLOOD (ROUTINE X 2)  CULTURE, BLOOD (ROUTINE X 2)  URINE CULTURE  I-STAT TROPOININ, ED  I-STAT CG4 LACTIC ACID, ED    EKG  EKG Interpretation None       Radiology Dg Chest 2 View  Result Date: 06/10/2016 CLINICAL DATA:  Altered mental status EXAM: CHEST  2 VIEW COMPARISON:  None. FINDINGS: There is no edema or consolidation. Heart size and pulmonary vascularity are normal. No adenopathy. There is atherosclerotic calcification in the aortic arch region. There is calcific tendinosis adjacent to the lateral aspect of the right proximal humerus. IMPRESSION: No edema or consolidation.  Aortic atherosclerosis. Electronically Signed   By: Lowella Grip III M.D.   On: 06/10/2016 11:06   Ct Head Wo Contrast  Result Date: 06/10/2016 CLINICAL DATA:  Confusion, left temporal headache, found lying on the floor EXAM: CT HEAD WITHOUT CONTRAST TECHNIQUE: Contiguous axial images were obtained from the base of the skull through the vertex without intravenous contrast. COMPARISON:  None. FINDINGS: Brain: Ventricular system is prominent as are the cortical sulci diffusely, consistent with atrophy. The septum is midline in position. The fourth  ventricle and basilar cisterns are unremarkable. Moderate small vessel ischemic change is present throughout the periventricular white matter. No hemorrhage, mass lesion, or acute infarction is seen. Vascular: No vascular abnormality is seen on this unenhanced study. Skull: No calvarial abnormality is noted. Sinuses/Orbits: The paranasal sinuses that are visualized appear pneumatized. Other: None IMPRESSION: 1. No acute intracranial abnormality. 2. Atrophy and moderate small vessel ischemic change. Electronically Signed   By: Ivar Drape M.D.   On: 06/10/2016 11:01    Procedures Procedures (including critical care time)  Medications Ordered in ED Medications  sodium chloride 0.9 % bolus 1,000 mL (not administered)  sodium chloride 0.9 % bolus 1,000 mL (1,000 mLs Intravenous New Bag/Given 06/10/16 1024)     Initial Impression / Assessment and Plan / ED Course  I have reviewed the triage vital signs and the  nursing notes.  Pertinent labs & imaging results that were available during my care of the patient were reviewed by me and considered in my medical decision making (see chart for details).  Clinical Course     82 yo F with PMHx as above here with AMS. Initial DDx is broad. No focal neuro deficits to suggest CVA or brain/mass lesion. DDx includes metabolic encephalopathy 2/2 occult infection, dehydration, drug effect/polypharmacy, delirium. EKG non-ischemic. Will check broad labs, imaging, and give IVF as she does appear dehydrated clinically. She also reports diarrhea but no fever, abdomen soft,NT, and ND - will hold on imaging at this time.  Labs reviewed as above. No apparent infection - CXR clear, UA without UTI. However, she does appear hyperglycemic and dehydrated, but without DKA. She remains altered. Will admit for IVF, further work-up.  Final Clinical Impressions(s) / ED Diagnoses   Final diagnoses:  Dehydration  Altered mental status, unspecified altered mental status type    Ketonuria    New Prescriptions New Prescriptions   No medications on file     Duffy Bruce, MD 06/11/16 [phone]

## 2016-06-10 NOTE — ED Notes (Signed)
MD Admitting at the bedside

## 2016-06-10 NOTE — Evaluation (Signed)
Physical Therapy Evaluation Patient Details Name: [name] [name] MRN: [id number] DOB: 1934/03/10 Today's Date: 06/10/2016   History of Present Illness  [name] [name] is a pt is an 82 y.o. female with h/o DM brought to ED with acute confusion.  Work up suggests dehydration.  Clinical Impression  Pt admitted with/for confusion, likely due to dehydration.  Pt currently limited functionally due to the problems listed below.  (see problems list.)  Pt will benefit from PT to maximize function and safety to be able to get home safely with available assist of family.    Follow Up Recommendations Home health PT;Supervision - Intermittent    Equipment Recommendations  None recommended by PT    Recommendations for Other Services       Precautions / Restrictions Precautions Precautions: Fall      Mobility  Bed Mobility Overal bed mobility: Modified Independent                Transfers Overall transfer level: Needs assistance   Transfers: Sit to/from Stand Sit to Stand: Supervision            Ambulation/Gait Ambulation/Gait assistance: Min guard Ambulation Distance (Feet): 15 Feet (x2 to bathroom;  pt deferred further) Assistive device: None Gait Pattern/deviations: Step-through pattern   Gait velocity interpretation: Below normal speed for age/gender General Gait Details: mildly unsteady gait, but pt just woke up.  Stairs            Wheelchair Mobility    Modified Rankin (Stroke Patients Only)       Balance Overall balance assessment: Needs assistance Sitting-balance support: No upper extremity supported Sitting balance-Leahy Scale: Good       Standing balance-Leahy Scale: Fair                               Pertinent Vitals/Pain Pain Assessment: No/denies pain    Home Living Family/patient expects to be discharged to:: Private residence Living Arrangements: Alone Available Help at Discharge: Available PRN/intermittently;Family (2  daughters 87 and 20 minutes away) Type of Home: House Home Access: Stairs to enter Entrance Stairs-Rails: None Entrance Stairs-Number of Steps: several Home Layout: One level Home Equipment: Cane - single point      Prior Function Level of Independence: Independent with assistive device(s);Independent               Hand Dominance        Extremity/Trunk Assessment   Upper Extremity Assessment: Defer to OT evaluation           Lower Extremity Assessment: Overall WFL for tasks assessed (mild proximal weakness)         Communication   Communication: No difficulties  Cognition Arousal/Alertness: Lethargic;Awake/alert Behavior During Therapy: WFL for tasks assessed/performed Overall Cognitive Status: Impaired/Different from baseline                      General Comments General comments (skin integrity, edema, etc.): pt still confused, not fully understanding simple directions.    Exercises     Assessment/Plan    PT Assessment Patient needs continued PT services  PT Problem List Decreased strength;Decreased activity tolerance          PT Treatment Interventions DME instruction;Stair training;Gait training;Functional mobility training;Therapeutic activities;Patient/family education;Balance training    PT Goals (Current goals can be found in the Care Plan section)  Acute Rehab PT Goals Patient Stated Goal: pt unable to state; daughters hope for  her to get back home independent PT Goal Formulation: With patient Time For Goal Achievement: 06/17/16 Potential to Achieve Goals: Good    Frequency Min 3X/week   Barriers to discharge Decreased caregiver support      Co-evaluation               End of Session   Activity Tolerance: Patient tolerated treatment well Patient left: in bed;with call bell/phone within reach;with family/visitor present Nurse Communication: Mobility status    Functional Assessment Tool Used: clinical  judgement Functional Limitation: Mobility: Walking and moving around Mobility: Walking and Moving Around Current Status [id number]): At least 1 percent but less than 20 percent impaired, limited or restricted Mobility: Walking and Moving Around Goal Status [phone]): 0 percent impaired, limited or restricted    Time: [id number] PT Time Calculation (min) (ACUTE ONLY): 17 min   Charges:   PT Evaluation $PT Eval Low Complexity: 1 Procedure     PT G Codes:   PT G-Codes **NOT FOR INPATIENT CLASS** Functional Assessment Tool Used: clinical judgement Functional Limitation: Mobility: Walking and moving around Mobility: Walking and Moving Around Current Status [id number]): At least 1 percent but less than 20 percent impaired, limited or restricted Mobility: Walking and Moving Around Goal Status [phone]): 0 percent impaired, limited or restricted    [name] [name] 06/10/2016, 5:10 PM 06/10/2016  Donnella Sham, PT [phone]  (pager)

## 2016-06-10 NOTE — ED Notes (Signed)
Phlebotomy at the bedside  

## 2016-06-10 NOTE — ED Notes (Signed)
Pt returned from CT and X-Ray.  

## 2016-06-10 NOTE — ED Notes (Signed)
Pt being transported upstairs

## 2016-06-10 NOTE — ED Notes (Signed)
Pt to be transported upstairs by Joelene Millin, EMT

## 2016-06-11 DIAGNOSIS — I10 Essential (primary) hypertension: Secondary | ICD-10-CM | POA: Diagnosis not present

## 2016-06-11 DIAGNOSIS — E860 Dehydration: Secondary | ICD-10-CM | POA: Diagnosis not present

## 2016-06-11 DIAGNOSIS — G9340 Encephalopathy, unspecified: Secondary | ICD-10-CM

## 2016-06-11 DIAGNOSIS — E118 Type 2 diabetes mellitus with unspecified complications: Secondary | ICD-10-CM

## 2016-06-11 LAB — URINE CULTURE
CULTURE: NO GROWTH
SPECIAL REQUESTS: NORMAL

## 2016-06-11 LAB — COMPREHENSIVE METABOLIC PANEL
ALBUMIN: 2.9 g/dL — AB (ref 3.5–5.0)
ALT: 15 U/L (ref 14–54)
AST: 18 U/L (ref 15–41)
Alkaline Phosphatase: 41 U/L (ref 38–126)
Anion gap: 10 (ref 5–15)
BILIRUBIN TOTAL: 0.8 mg/dL (ref 0.3–1.2)
BUN: 15 mg/dL (ref 6–20)
CHLORIDE: 102 mmol/L (ref 101–111)
CO2: 23 mmol/L (ref 22–32)
Calcium: 9.1 mg/dL (ref 8.9–10.3)
Creatinine, Ser: 1.04 mg/dL — ABNORMAL HIGH (ref 0.44–1.00)
GFR calc Af Amer: 57 mL/min — ABNORMAL LOW (ref >60)
GFR calc non Af Amer: 49 mL/min — ABNORMAL LOW (ref >60)
GLUCOSE: 273 mg/dL — AB (ref 65–99)
POTASSIUM: 4.2 mmol/L (ref 3.5–5.1)
SODIUM: 135 mmol/L (ref 135–145)
TOTAL PROTEIN: 5.5 g/dL — AB (ref 6.5–8.1)

## 2016-06-11 LAB — CBC
HEMATOCRIT: 27.7 % — AB (ref 36.0–46.0)
HEMOGLOBIN: 9.3 g/dL — AB (ref 12.0–15.0)
MCH: 30.1 pg (ref 26.0–34.0)
MCHC: 33.6 g/dL (ref 30.0–36.0)
MCV: 89.6 fL (ref 78.0–100.0)
Platelets: 213 10*3/uL (ref 150–400)
RBC: 3.09 MIL/uL — ABNORMAL LOW (ref 3.87–5.11)
RDW: 13.2 % (ref 11.5–15.5)
WBC: 6.6 10*3/uL (ref 4.0–10.5)

## 2016-06-11 LAB — LACTIC ACID, PLASMA: Lactic Acid, Venous: 0.7 mmol/L (ref 0.5–1.9)

## 2016-06-11 LAB — GLUCOSE, CAPILLARY
Glucose-Capillary: 243 mg/dL — ABNORMAL HIGH (ref 65–99)
Glucose-Capillary: 296 mg/dL — ABNORMAL HIGH (ref 65–99)
Glucose-Capillary: 309 mg/dL — ABNORMAL HIGH (ref 65–99)

## 2016-06-11 LAB — HEMOGLOBIN A1C
Hgb A1c MFr Bld: 8.2 % — ABNORMAL HIGH (ref 4.8–5.6)
MEAN PLASMA GLUCOSE: 189 mg/dL

## 2016-06-11 MED ORDER — SODIUM CHLORIDE 0.9 % IV SOLN
INTRAVENOUS | Status: DC
  Administered 2016-06-11: 06:00:00 via INTRAVENOUS

## 2016-06-11 MED ORDER — INSULIN DETEMIR 100 UNIT/ML ~~LOC~~ SOLN
6.0000 [IU] | Freq: Two times a day (BID) | SUBCUTANEOUS | Status: DC
  Administered 2016-06-11: 6 [IU] via SUBCUTANEOUS
  Filled 2016-06-11 (×2): qty 0.06

## 2016-06-11 NOTE — Discharge Summary (Addendum)
PATIENT DETAILS Name: [name] [name] Age: 82 y.o. Sex: female Date of Birth: [birth year] MRN: [id number]. Admitting Physician: Waldemar Dickens, MD [id number] KELLER, MD  Admit Date: 06/10/2016 Discharge date: 06/11/2016  Recommendations for Outpatient Follow-up:  1. Follow up with PCP in 1-2 weeks 2. Please obtain BMP/CBC in one week 3. Please follow up on the following pending results:Blood culture  Admitted From:  Home  Disposition: [place]: Yes-HHPT/Aide (ordered)  Equipment/Devices: None  Discharge Condition: Stable  CODE STATUS: FULL CODE  Diet recommendation:  Heart Healthy / Carb Modified   Brief Summary: See H&P, Labs, Consult and Test reports for all details in brief, [name] [name] is a 82 y.o. female  who presented with confusion, she recently had diarrhea. She was admitted for further eval and treatment.   Brief Hospital Course: Acute encephalopathy: Resolved, completely awake and alert to time of discharge. Suspect etiology to be from dehydration from her recent diarrheal illness. Note CT head negative. Note chest x-ray and UA negative for any signs of infection. Blood cultures currently pending at the time of discharge we'll need to be followed by her PCP.  Dehydration: Secondary to recent diarrheal illness. Resolved with gentle hydration. She no longer has any diarrhea.  Diarrhea: Suspect viral syndrome him a this is resolved.  Hypertension: Controlled, continue Cozaar and Lasix.  Hypothyroidism: Synthroid, TSH within normal limits.  Insulin-dependent diabetes, A1c 8.2, continue pre-meal NovoLog and Levemir. She is also on metformin that is being continued  Dyslipidemia: Continue statin  Chronic Anemia: Slight drop in hemoglobin-suspect from IV fluid dilution. Continue iron supplementation, follow CBC at next visit with CBC. Note no evidence of any overt blood loss at this time.  Procedures/Studies: None  Discharge  Diagnoses:  Active Problems:   Hypertension   Hyperlipidemia   GERD (gastroesophageal reflux disease)   Insomnia   Acute Encephalopathy   Severe dehydration   Diabetes (HCC)   Lactic acidosis   Diabetes mellitus with complication (HCC)   Viral gastroenteritis   Discharge Instructions:  Activity:  As tolerated with Full fall precautions use walker/cane & assistance as needed  Discharge Instructions    Diet - low sodium heart healthy    Complete by:  As directed    Diet Carb Modified    Complete by:  As directed    Increase activity slowly    Complete by:  As directed        Medication List    TAKE these medications   acetaminophen 500 MG tablet Commonly known as:  TYLENOL Take 1,000 mg by mouth every 6 (six) hours as needed (pain).   alendronate 70 MG tablet Commonly known as:  FOSAMAX Take 70 mg by mouth every Saturday. Take with a full glass of water on an empty stomach.   aspirin EC 81 MG tablet Take 81 mg by mouth daily.   CALCIUM-D PO Take 2 tablets by mouth 2 (two) times daily.   FISH OIL PO Take 1 capsule by mouth 2 (two) times daily.   furosemide 20 MG tablet Commonly known as:  LASIX Take 20 mg by mouth every Monday, Wednesday, and Friday.   insulin aspart 100 UNIT/ML injection Commonly known as:  novoLOG Inject 7-8 Units into the skin See admin instructions. Inject 7 units subcutaneously at breakfast and lunch and 8 units at supper   iron polysaccharides 150 MG capsule Commonly known as:  NIFEREX Take 150 mg by mouth 2 (two) times daily.   LEVEMIR FLEXTOUCH  100 UNIT/ML Pen Generic drug:  Insulin Detemir Inject 6-7 Units into the skin See admin instructions. Inject 7 units subcutaneously every morning and 6 units at night   levothyroxine 100 MCG tablet Commonly known as:  SYNTHROID, LEVOTHROID Take 100 mcg by mouth daily before breakfast.   loratadine 10 MG tablet Commonly known as:  CLARITIN Take 10 mg by mouth daily as needed for  allergies.   losartan 25 MG tablet Commonly known as:  COZAAR Take 25 mg by mouth daily.   metFORMIN 1000 MG tablet Commonly known as:  GLUCOPHAGE Take 1,000 mg by mouth 2 (two) times daily with a meal.   mometasone 50 MCG/ACT nasal spray Commonly known as:  NASONEX Place 2 sprays into the nose 2 (two) times daily as needed (seasonal allergies).   pantoprazole 40 MG tablet Commonly known as:  PROTONIX Take 40 mg by mouth daily.   polyethylene glycol packet Commonly known as:  MIRALAX / GLYCOLAX Take 17 g by mouth every Monday, Wednesday, and Friday. Mix in 8 oz liquid and drink   simvastatin 40 MG tablet Commonly known as:  ZOCOR Take 40 mg by mouth every evening.   VITAMIN D PO Take 1 tablet by mouth daily.      Follow-up Information    Cammy Copa, MD. Schedule an appointment as soon as possible for a visit in 1 week(s).   Specialty:  Family Medicine Contact information: [address] [place] [address] [phone]          Allergies  Allergen Reactions  . Lisinopril Cough    Reported by [facility]  . Sulfa Antibiotics Hives  . Bactrim [Sulfamethoxazole-Trimethoprim] Rash    Reported by [facility]    Consultations:   None  Other Procedures/Studies: Dg Chest 2 View  Result Date: 06/10/2016 CLINICAL DATA:  Altered mental status EXAM: CHEST  2 VIEW COMPARISON:  None. FINDINGS: There is no edema or consolidation. Heart size and pulmonary vascularity are normal. No adenopathy. There is atherosclerotic calcification in the aortic arch region. There is calcific tendinosis adjacent to the lateral aspect of the right proximal humerus. IMPRESSION: No edema or consolidation.  Aortic atherosclerosis. Electronically Signed   By: Lowella Grip III M.D.   On: 06/10/2016 11:06   Ct Head Wo Contrast  Result Date: 06/10/2016 CLINICAL DATA:  Confusion, left temporal headache, found lying on the floor EXAM: CT HEAD WITHOUT  CONTRAST TECHNIQUE: Contiguous axial images were obtained from the base of the skull through the vertex without intravenous contrast. COMPARISON:  None. FINDINGS: Brain: Ventricular system is prominent as are the cortical sulci diffusely, consistent with atrophy. The septum is midline in position. The fourth ventricle and basilar cisterns are unremarkable. Moderate small vessel ischemic change is present throughout the periventricular white matter. No hemorrhage, mass lesion, or acute infarction is seen. Vascular: No vascular abnormality is seen on this unenhanced study. Skull: No calvarial abnormality is noted. Sinuses/Orbits: The paranasal sinuses that are visualized appear pneumatized. Other: None IMPRESSION: 1. No acute intracranial abnormality. 2. Atrophy and moderate small vessel ischemic change. Electronically Signed   By: Ivar Drape M.D.   On: 06/10/2016 11:01      TODAY-DAY OF DISCHARGE:  Subjective:   Riesa Pope today has no headache,no chest abdominal pain,no new weakness tingling or numbness, feels much better wants to go home today.   Objective:   Blood pressure (!) 125/56, pulse 64, temperature 98.3 F (36.8 C), temperature source Oral, resp. rate 20,  height 5\' 8"  (1.727 m), weight 61.4 kg (135 lb 5.8 oz), SpO2 100 %.  Intake/Output Summary (Last 24 hours) at 06/11/16 1003 Last data filed at 06/11/16 0936  Gross per 24 hour  Intake             2970 ml  Output                0 ml  Net             2970 ml   Filed Weights   06/10/16 1009 06/11/16 0654  Weight: 68 kg (150 lb) 61.4 kg (135 lb 5.8 oz)    Exam: Awake Alert, Oriented *3, No new F.N deficits, Normal affect [place].AT,PERRAL Supple Neck,No JVD, No cervical lymphadenopathy appriciated.  Symmetrical Chest wall movement, Good air movement bilaterally, CTAB RRR,No Gallops,Rubs or new Murmurs, No Parasternal Heave +ve B.Sounds, Abd Soft, Non tender, No organomegaly appriciated, No rebound -guarding or rigidity. No  Cyanosis, Clubbing or edema, No new Rash or bruise   PERTINENT RADIOLOGIC STUDIES: Dg Chest 2 View  Result Date: 06/10/2016 CLINICAL DATA:  Altered mental status EXAM: CHEST  2 VIEW COMPARISON:  None. FINDINGS: There is no edema or consolidation. Heart size and pulmonary vascularity are normal. No adenopathy. There is atherosclerotic calcification in the aortic arch region. There is calcific tendinosis adjacent to the lateral aspect of the right proximal humerus. IMPRESSION: No edema or consolidation.  Aortic atherosclerosis. Electronically Signed   By: Lowella Grip III M.D.   On: 06/10/2016 11:06   Ct Head Wo Contrast  Result Date: 06/10/2016 CLINICAL DATA:  Confusion, left temporal headache, found lying on the floor EXAM: CT HEAD WITHOUT CONTRAST TECHNIQUE: Contiguous axial images were obtained from the base of the skull through the vertex without intravenous contrast. COMPARISON:  None. FINDINGS: Brain: Ventricular system is prominent as are the cortical sulci diffusely, consistent with atrophy. The septum is midline in position. The fourth ventricle and basilar cisterns are unremarkable. Moderate small vessel ischemic change is present throughout the periventricular white matter. No hemorrhage, mass lesion, or acute infarction is seen. Vascular: No vascular abnormality is seen on this unenhanced study. Skull: No calvarial abnormality is noted. Sinuses/Orbits: The paranasal sinuses that are visualized appear pneumatized. Other: None IMPRESSION: 1. No acute intracranial abnormality. 2. Atrophy and moderate small vessel ischemic change. Electronically Signed   By: Ivar Drape M.D.   On: 06/10/2016 11:01     PERTINENT LAB RESULTS: CBC:  Recent Labs  06/10/16 1025 06/11/16 0509  WBC 10.3 6.6  HGB 11.3* 9.3*  HCT 33.0* 27.7*  PLT 190 213   CMET CMP     Component Value Date/Time   NA 135 06/11/2016 0509   K 4.2 06/11/2016 0509   CL 102 06/11/2016 0509   CO2 23 06/11/2016 0509    GLUCOSE 273 (H) 06/11/2016 0509   BUN 15 06/11/2016 0509   CREATININE 1.04 (H) 06/11/2016 0509   CALCIUM 9.1 06/11/2016 0509   PROT 5.5 (L) 06/11/2016 0509   ALBUMIN 2.9 (L) 06/11/2016 0509   AST 18 06/11/2016 0509   ALT 15 06/11/2016 0509   ALKPHOS 41 06/11/2016 0509   BILITOT 0.8 06/11/2016 0509   GFRNONAA 49 (L) 06/11/2016 0509   GFRAA 57 (L) 06/11/2016 0509    GFR Estimated Creatinine Clearance: 41.8 mL/min (by C-G formula based on SCr of 1.04 mg/dL (H)). No results for input(s): LIPASE, AMYLASE in the last 72 hours. No results for input(s): CKTOTAL, CKMB, CKMBINDEX, TROPONINI in  the last 72 hours. Invalid input(s): POCBNP No results for input(s): DDIMER in the last 72 hours.  Recent Labs  06/10/16 1429  HGBA1C 8.2*   No results for input(s): CHOL, HDL, LDLCALC, TRIG, CHOLHDL, LDLDIRECT in the last 72 hours.  Recent Labs  06/10/16 1540  TSH 0.445   No results for input(s): VITAMINB12, FOLATE, FERRITIN, TIBC, IRON, RETICCTPCT in the last 72 hours. Coags: No results for input(s): INR in the last 72 hours.  Invalid input(s): PT Microbiology: Recent Results (from the past 240 hour(s))  Urine culture     Status: None   Collection Time: 06/10/16 11:28 AM  Result Value Ref Range Status   Specimen Description URINE, CLEAN CATCH  Final   Special Requests Normal  Final   Culture NO GROWTH  Final   Report Status 06/11/2016 FINAL  Final    FURTHER DISCHARGE INSTRUCTIONS:  Get Medicines reviewed and adjusted: Please take all your medications with you for your next visit with your Primary MD  Laboratory/radiological data: Please request your Primary MD to go over all hospital tests and procedure/radiological results at the follow up, please ask your Primary MD to get all Hospital records sent to his/her office.  In some cases, they will be blood work, cultures and biopsy results pending at the time of your discharge. Please request that your primary care M.D. goes  through all the records of your hospital data and follows up on these results.  Also Note the following: If you experience worsening of your admission symptoms, develop shortness of breath, life threatening emergency, suicidal or homicidal thoughts you must seek medical attention immediately by calling 911 or calling your MD immediately  if symptoms less severe.  You must read complete instructions/literature along with all the possible adverse reactions/side effects for all the Medicines you take and that have been prescribed to you. Take any new Medicines after you have completely understood and accpet all the possible adverse reactions/side effects.   Do not drive when taking Pain medications or sleeping medications (Benzodaizepines)  Do not take more than prescribed Pain, Sleep and Anxiety Medications. It is not advisable to combine anxiety,sleep and pain medications without talking with your primary care practitioner  Special Instructions: If you have smoked or chewed Tobacco  in the last 2 yrs please stop smoking, stop any regular Alcohol  and or any Recreational drug use.  Wear Seat belts while driving.  Please note: You were cared for by a hospitalist during your hospital stay. Once you are discharged, your primary care physician will handle any further medical issues. Please note that NO REFILLS for any discharge medications will be authorized once you are discharged, as it is imperative that you return to your primary care physician (or establish a relationship with a primary care physician if you do not have one) for your post hospital discharge needs so that they can reassess your need for medications and monitor your lab values.  Total Time spent coordinating discharge including counseling, education and face to face time equals 25  minutes.  SignedOren Binet 06/11/2016 10:03 AM

## 2016-06-11 NOTE — Care Management Note (Signed)
Case Management Note  Patient Details  Name: Arrietty Gramley MRN: [id number] Date of Birth: 1933-12-21  Subjective/Objective:   Acute encephalopathy                 Action/Plan: Discharge Planning: AVS reviewed NCM spoke to pt's dtr, [name] [phone]. Pt lives at home alone. Offered choice for [facility]. Dtr agreeable to [facility] for [facility] Pt had in the past. Requesting a RW for home. Dtr states she did not want [place]. Contacted [facility] Liaison with new referral and DME needed.   Expected Discharge Date:  06/11/2016              Expected Discharge Plan:  [place]  In-House Referral:  NA  Discharge planning Services  CM Consult  Post Acute Care Choice:  Home Health Choice offered to:  Adult Children  DME Arranged:  [facility] D[facility] Agency:  [place] Arranged:  PT [place] Agency:  [place]  Status of Service:  Completed, signed off  If discussed at [place] of Stay Meetings, dates discussed:    Additional Comments:  Erenest Rasher, RN 06/11/2016, 11:33 AM

## 2016-06-11 NOTE — Progress Notes (Signed)
Completely awake and alert UA/CXR/CT Head neg Suspect transient confusion was from dehydration from recent diarrhea (that has resolved) Home today See d/c summary for details.

## 2016-06-11 NOTE — Progress Notes (Signed)
Paged Tylene Fantasia in order to clarify D5NS @100 . Stated it was OK to leave plain NS @100  running d/t elevated CBG's. Awaiting further orders.

## 2016-06-11 NOTE — Progress Notes (Signed)
Pt ambulated 1 time around nurses station. Pt and family are requesting a walker at home d/t pt holding onto railings when ambulating down hallway.

## 2016-06-11 NOTE — Progress Notes (Signed)
NURSING PROGRESS NOTE  Pascale Deschamp [id number] Discharge Data: 06/11/2016 10:23 AM Attending Provider: Jonetta Osgood, MD [id number] KELLER, MD     Riesa Pope to be D/C'd Home per MD order.  Discussed with the patient the After Visit Summary and all questions fully answered. All IV's discontinued with no bleeding noted. All belongings returned to patient for patient to take home.   Last Vital Signs:  Blood pressure (!) 125/56, pulse 64, temperature 98.3 F (36.8 C), temperature source Oral, resp. rate 20, height 5\' 8"  (1.727 m), weight 61.4 kg (135 lb 5.8 oz), SpO2 100 %.  Discharge Medication List   Medication List    TAKE these medications   acetaminophen 500 MG tablet Commonly known as:  TYLENOL Take 1,000 mg by mouth every 6 (six) hours as needed (pain).   alendronate 70 MG tablet Commonly known as:  FOSAMAX Take 70 mg by mouth every Saturday. Take with a full glass of water on an empty stomach.   aspirin EC 81 MG tablet Take 81 mg by mouth daily.   CALCIUM-D PO Take 2 tablets by mouth 2 (two) times daily.   FISH OIL PO Take 1 capsule by mouth 2 (two) times daily.   furosemide 20 MG tablet Commonly known as:  LASIX Take 20 mg by mouth every Monday, Wednesday, and Friday.   insulin aspart 100 UNIT/ML injection Commonly known as:  novoLOG Inject 7-8 Units into the skin See admin instructions. Inject 7 units subcutaneously at breakfast and lunch and 8 units at supper   iron polysaccharides 150 MG capsule Commonly known as:  NIFEREX Take 150 mg by mouth 2 (two) times daily.   LEVEMIR FLEXTOUCH 100 UNIT/ML Pen Generic drug:  Insulin Detemir Inject 6-7 Units into the skin See admin instructions. Inject 7 units subcutaneously every morning and 6 units at night   levothyroxine 100 MCG tablet Commonly known as:  SYNTHROID, LEVOTHROID Take 100 mcg by mouth daily before breakfast.   loratadine 10 MG tablet Commonly known as:  CLARITIN Take 10 mg by  mouth daily as needed for allergies.   losartan 25 MG tablet Commonly known as:  COZAAR Take 25 mg by mouth daily.   metFORMIN 1000 MG tablet Commonly known as:  GLUCOPHAGE Take 1,000 mg by mouth 2 (two) times daily with a meal.   mometasone 50 MCG/ACT nasal spray Commonly known as:  NASONEX Place 2 sprays into the nose 2 (two) times daily as needed (seasonal allergies).   pantoprazole 40 MG tablet Commonly known as:  PROTONIX Take 40 mg by mouth daily.   polyethylene glycol packet Commonly known as:  MIRALAX / GLYCOLAX Take 17 g by mouth every Monday, Wednesday, and Friday. Mix in 8 oz liquid and drink   simvastatin 40 MG tablet Commonly known as:  ZOCOR Take 40 mg by mouth every evening.   VITAMIN D PO Take 1 tablet by mouth daily.        Charolette Child, RN

## 2016-06-12 ENCOUNTER — Encounter: Payer: Self-pay | Admitting: Gynecology

## 2016-06-15 DIAGNOSIS — E785 Hyperlipidemia, unspecified: Secondary | ICD-10-CM | POA: Diagnosis not present

## 2016-06-15 DIAGNOSIS — E119 Type 2 diabetes mellitus without complications: Secondary | ICD-10-CM | POA: Diagnosis not present

## 2016-06-15 DIAGNOSIS — I10 Essential (primary) hypertension: Secondary | ICD-10-CM | POA: Diagnosis not present

## 2016-06-15 DIAGNOSIS — Z794 Long term (current) use of insulin: Secondary | ICD-10-CM | POA: Diagnosis not present

## 2016-06-15 DIAGNOSIS — Z7982 Long term (current) use of aspirin: Secondary | ICD-10-CM | POA: Diagnosis not present

## 2016-06-15 DIAGNOSIS — G9340 Encephalopathy, unspecified: Secondary | ICD-10-CM | POA: Diagnosis not present

## 2016-06-15 DIAGNOSIS — D539 Nutritional anemia, unspecified: Secondary | ICD-10-CM | POA: Diagnosis not present

## 2016-06-15 LAB — CULTURE, BLOOD (ROUTINE X 2)
CULTURE: NO GROWTH
CULTURE: NO GROWTH
CULTURE: NO GROWTH
Culture: NO GROWTH

## 2016-06-16 DIAGNOSIS — R410 Disorientation, unspecified: Secondary | ICD-10-CM | POA: Diagnosis not present

## 2016-06-17 DIAGNOSIS — D539 Nutritional anemia, unspecified: Secondary | ICD-10-CM | POA: Diagnosis not present

## 2016-06-17 DIAGNOSIS — Z794 Long term (current) use of insulin: Secondary | ICD-10-CM | POA: Diagnosis not present

## 2016-06-17 DIAGNOSIS — Z7982 Long term (current) use of aspirin: Secondary | ICD-10-CM | POA: Diagnosis not present

## 2016-06-17 DIAGNOSIS — G9340 Encephalopathy, unspecified: Secondary | ICD-10-CM | POA: Diagnosis not present

## 2016-06-17 DIAGNOSIS — E785 Hyperlipidemia, unspecified: Secondary | ICD-10-CM | POA: Diagnosis not present

## 2016-06-17 DIAGNOSIS — I10 Essential (primary) hypertension: Secondary | ICD-10-CM | POA: Diagnosis not present

## 2016-06-17 DIAGNOSIS — E119 Type 2 diabetes mellitus without complications: Secondary | ICD-10-CM | POA: Diagnosis not present

## 2016-06-19 DIAGNOSIS — I10 Essential (primary) hypertension: Secondary | ICD-10-CM | POA: Diagnosis not present

## 2016-06-19 DIAGNOSIS — E119 Type 2 diabetes mellitus without complications: Secondary | ICD-10-CM | POA: Diagnosis not present

## 2016-06-19 DIAGNOSIS — G9340 Encephalopathy, unspecified: Secondary | ICD-10-CM | POA: Diagnosis not present

## 2016-06-19 DIAGNOSIS — E785 Hyperlipidemia, unspecified: Secondary | ICD-10-CM | POA: Diagnosis not present

## 2016-06-19 DIAGNOSIS — Z7982 Long term (current) use of aspirin: Secondary | ICD-10-CM | POA: Diagnosis not present

## 2016-06-19 DIAGNOSIS — D539 Nutritional anemia, unspecified: Secondary | ICD-10-CM | POA: Diagnosis not present

## 2016-06-19 DIAGNOSIS — Z794 Long term (current) use of insulin: Secondary | ICD-10-CM | POA: Diagnosis not present

## 2016-06-22 DIAGNOSIS — D539 Nutritional anemia, unspecified: Secondary | ICD-10-CM | POA: Diagnosis not present

## 2016-06-22 DIAGNOSIS — E785 Hyperlipidemia, unspecified: Secondary | ICD-10-CM | POA: Diagnosis not present

## 2016-06-22 DIAGNOSIS — E119 Type 2 diabetes mellitus without complications: Secondary | ICD-10-CM | POA: Diagnosis not present

## 2016-06-22 DIAGNOSIS — Z7982 Long term (current) use of aspirin: Secondary | ICD-10-CM | POA: Diagnosis not present

## 2016-06-22 DIAGNOSIS — G9340 Encephalopathy, unspecified: Secondary | ICD-10-CM | POA: Diagnosis not present

## 2016-06-22 DIAGNOSIS — Z794 Long term (current) use of insulin: Secondary | ICD-10-CM | POA: Diagnosis not present

## 2016-06-22 DIAGNOSIS — I10 Essential (primary) hypertension: Secondary | ICD-10-CM | POA: Diagnosis not present

## 2016-06-25 DIAGNOSIS — D539 Nutritional anemia, unspecified: Secondary | ICD-10-CM | POA: Diagnosis not present

## 2016-06-25 DIAGNOSIS — Z794 Long term (current) use of insulin: Secondary | ICD-10-CM | POA: Diagnosis not present

## 2016-06-25 DIAGNOSIS — Z7982 Long term (current) use of aspirin: Secondary | ICD-10-CM | POA: Diagnosis not present

## 2016-06-25 DIAGNOSIS — E785 Hyperlipidemia, unspecified: Secondary | ICD-10-CM | POA: Diagnosis not present

## 2016-06-25 DIAGNOSIS — I10 Essential (primary) hypertension: Secondary | ICD-10-CM | POA: Diagnosis not present

## 2016-06-25 DIAGNOSIS — G9340 Encephalopathy, unspecified: Secondary | ICD-10-CM | POA: Diagnosis not present

## 2016-06-25 DIAGNOSIS — Z5181 Encounter for therapeutic drug level monitoring: Secondary | ICD-10-CM | POA: Diagnosis not present

## 2016-06-25 DIAGNOSIS — E119 Type 2 diabetes mellitus without complications: Secondary | ICD-10-CM | POA: Diagnosis not present

## 2016-06-25 DIAGNOSIS — E1142 Type 2 diabetes mellitus with diabetic polyneuropathy: Secondary | ICD-10-CM | POA: Diagnosis not present

## 2016-06-26 DIAGNOSIS — I10 Essential (primary) hypertension: Secondary | ICD-10-CM | POA: Diagnosis not present

## 2016-06-26 DIAGNOSIS — Z794 Long term (current) use of insulin: Secondary | ICD-10-CM | POA: Diagnosis not present

## 2016-06-26 DIAGNOSIS — M25571 Pain in right ankle and joints of right foot: Secondary | ICD-10-CM | POA: Diagnosis not present

## 2016-06-26 DIAGNOSIS — M65871 Other synovitis and tenosynovitis, right ankle and foot: Secondary | ICD-10-CM | POA: Diagnosis not present

## 2016-06-26 DIAGNOSIS — G9340 Encephalopathy, unspecified: Secondary | ICD-10-CM | POA: Diagnosis not present

## 2016-06-26 DIAGNOSIS — D539 Nutritional anemia, unspecified: Secondary | ICD-10-CM | POA: Diagnosis not present

## 2016-06-26 DIAGNOSIS — E119 Type 2 diabetes mellitus without complications: Secondary | ICD-10-CM | POA: Diagnosis not present

## 2016-06-26 DIAGNOSIS — Z7982 Long term (current) use of aspirin: Secondary | ICD-10-CM | POA: Diagnosis not present

## 2016-06-26 DIAGNOSIS — E785 Hyperlipidemia, unspecified: Secondary | ICD-10-CM | POA: Diagnosis not present

## 2016-07-01 DIAGNOSIS — Z794 Long term (current) use of insulin: Secondary | ICD-10-CM | POA: Diagnosis not present

## 2016-07-01 DIAGNOSIS — G9340 Encephalopathy, unspecified: Secondary | ICD-10-CM | POA: Diagnosis not present

## 2016-07-01 DIAGNOSIS — Z7982 Long term (current) use of aspirin: Secondary | ICD-10-CM | POA: Diagnosis not present

## 2016-07-01 DIAGNOSIS — I10 Essential (primary) hypertension: Secondary | ICD-10-CM | POA: Diagnosis not present

## 2016-07-01 DIAGNOSIS — E785 Hyperlipidemia, unspecified: Secondary | ICD-10-CM | POA: Diagnosis not present

## 2016-07-01 DIAGNOSIS — D539 Nutritional anemia, unspecified: Secondary | ICD-10-CM | POA: Diagnosis not present

## 2016-07-01 DIAGNOSIS — E119 Type 2 diabetes mellitus without complications: Secondary | ICD-10-CM | POA: Diagnosis not present

## 2016-07-03 DIAGNOSIS — R079 Chest pain, unspecified: Secondary | ICD-10-CM | POA: Diagnosis not present

## 2016-07-03 DIAGNOSIS — R55 Syncope and collapse: Secondary | ICD-10-CM | POA: Diagnosis not present

## 2016-07-03 DIAGNOSIS — I10 Essential (primary) hypertension: Secondary | ICD-10-CM | POA: Diagnosis not present

## 2016-07-03 DIAGNOSIS — R002 Palpitations: Secondary | ICD-10-CM | POA: Diagnosis not present

## 2016-07-09 DIAGNOSIS — R0789 Other chest pain: Secondary | ICD-10-CM | POA: Diagnosis not present

## 2016-07-15 DIAGNOSIS — R002 Palpitations: Secondary | ICD-10-CM | POA: Diagnosis not present

## 2016-07-15 DIAGNOSIS — R55 Syncope and collapse: Secondary | ICD-10-CM | POA: Diagnosis not present

## 2016-08-01 DIAGNOSIS — R002 Palpitations: Secondary | ICD-10-CM | POA: Diagnosis not present

## 2016-08-19 DIAGNOSIS — E1140 Type 2 diabetes mellitus with diabetic neuropathy, unspecified: Secondary | ICD-10-CM | POA: Diagnosis not present

## 2016-08-19 DIAGNOSIS — E1165 Type 2 diabetes mellitus with hyperglycemia: Secondary | ICD-10-CM | POA: Diagnosis not present

## 2016-08-21 DIAGNOSIS — M5416 Radiculopathy, lumbar region: Secondary | ICD-10-CM | POA: Diagnosis not present

## 2016-08-28 DIAGNOSIS — R002 Palpitations: Secondary | ICD-10-CM | POA: Diagnosis not present

## 2016-08-28 DIAGNOSIS — R0789 Other chest pain: Secondary | ICD-10-CM | POA: Diagnosis not present

## 2016-08-28 DIAGNOSIS — E7800 Pure hypercholesterolemia, unspecified: Secondary | ICD-10-CM | POA: Diagnosis not present

## 2016-08-28 DIAGNOSIS — R55 Syncope and collapse: Secondary | ICD-10-CM | POA: Diagnosis not present

## 2016-08-31 DIAGNOSIS — E1142 Type 2 diabetes mellitus with diabetic polyneuropathy: Secondary | ICD-10-CM | POA: Diagnosis not present

## 2016-08-31 DIAGNOSIS — B370 Candidal stomatitis: Secondary | ICD-10-CM | POA: Diagnosis not present

## 2016-08-31 DIAGNOSIS — Z5181 Encounter for therapeutic drug level monitoring: Secondary | ICD-10-CM | POA: Diagnosis not present

## 2016-08-31 DIAGNOSIS — Z794 Long term (current) use of insulin: Secondary | ICD-10-CM | POA: Diagnosis not present

## 2016-09-03 DIAGNOSIS — M47812 Spondylosis without myelopathy or radiculopathy, cervical region: Secondary | ICD-10-CM | POA: Diagnosis not present

## 2016-09-03 DIAGNOSIS — R69 Illness, unspecified: Secondary | ICD-10-CM | POA: Diagnosis not present

## 2016-09-09 DIAGNOSIS — M542 Cervicalgia: Secondary | ICD-10-CM | POA: Diagnosis not present

## 2016-09-11 DIAGNOSIS — I10 Essential (primary) hypertension: Secondary | ICD-10-CM | POA: Diagnosis not present

## 2016-09-18 DIAGNOSIS — Z79899 Other long term (current) drug therapy: Secondary | ICD-10-CM | POA: Diagnosis not present

## 2016-09-18 DIAGNOSIS — N189 Chronic kidney disease, unspecified: Secondary | ICD-10-CM | POA: Diagnosis not present

## 2016-09-18 DIAGNOSIS — E1165 Type 2 diabetes mellitus with hyperglycemia: Secondary | ICD-10-CM | POA: Diagnosis not present

## 2016-09-18 DIAGNOSIS — E039 Hypothyroidism, unspecified: Secondary | ICD-10-CM | POA: Diagnosis not present

## 2016-09-18 DIAGNOSIS — I10 Essential (primary) hypertension: Secondary | ICD-10-CM | POA: Diagnosis not present

## 2016-09-18 DIAGNOSIS — Z7984 Long term (current) use of oral hypoglycemic drugs: Secondary | ICD-10-CM | POA: Diagnosis not present

## 2016-09-24 DIAGNOSIS — M25571 Pain in right ankle and joints of right foot: Secondary | ICD-10-CM | POA: Diagnosis not present

## 2016-09-24 DIAGNOSIS — M65871 Other synovitis and tenosynovitis, right ankle and foot: Secondary | ICD-10-CM | POA: Diagnosis not present

## 2016-10-01 ENCOUNTER — Ambulatory Visit (INDEPENDENT_AMBULATORY_CARE_PROVIDER_SITE_OTHER): Payer: Medicare HMO | Admitting: Gynecology

## 2016-10-01 VITALS — BP 130/82

## 2016-10-01 DIAGNOSIS — N760 Acute vaginitis: Secondary | ICD-10-CM | POA: Diagnosis not present

## 2016-10-01 DIAGNOSIS — M810 Age-related osteoporosis without current pathological fracture: Secondary | ICD-10-CM

## 2016-10-01 DIAGNOSIS — R300 Dysuria: Secondary | ICD-10-CM

## 2016-10-01 DIAGNOSIS — B9689 Other specified bacterial agents as the cause of diseases classified elsewhere: Secondary | ICD-10-CM | POA: Diagnosis not present

## 2016-10-01 DIAGNOSIS — N9089 Other specified noninflammatory disorders of vulva and perineum: Secondary | ICD-10-CM | POA: Diagnosis not present

## 2016-10-01 DIAGNOSIS — N898 Other specified noninflammatory disorders of vagina: Secondary | ICD-10-CM

## 2016-10-01 DIAGNOSIS — N952 Postmenopausal atrophic vaginitis: Secondary | ICD-10-CM | POA: Diagnosis not present

## 2016-10-01 LAB — URINALYSIS W MICROSCOPIC + REFLEX CULTURE
Bilirubin Urine: NEGATIVE
CRYSTALS: NONE SEEN [HPF]
Casts: NONE SEEN [LPF]
Glucose, UA: NEGATIVE
Hgb urine dipstick: NEGATIVE
Ketones, ur: NEGATIVE
Leukocytes, UA: NEGATIVE
Nitrite: NEGATIVE
RBC / HPF: NONE SEEN RBC/HPF (ref <=2)
Specific Gravity, Urine: 1.015 (ref 1.001–1.035)
Yeast: NONE SEEN [HPF]
pH: 7 (ref 5.0–8.0)

## 2016-10-01 LAB — WET PREP FOR TRICH, YEAST, CLUE
TRICH WET PREP: NONE SEEN
YEAST WET PREP: NONE SEEN

## 2016-10-01 MED ORDER — CLINDAMYCIN PHOSPHATE 2 % VA CREA: 1.0000 | TOPICAL_CREAM | Freq: Every day | VAGINAL | 0 refills | Status: DC

## 2016-10-01 NOTE — Progress Notes (Signed)
   Patient is a 83 year old who presented to the office complaining of vaginal irritation slight discharge and some burning with urination. Patient has history of vaginal atrophy and at times is so kind to use the vaginal estrogen twice a week to alleviate some of the symptoms. Patient with prior hysterectomy several years ago.  Exam: Bartholin urethra Skene glands with atrophic changes Vagina: Atrophic changes slight clear discharge Bimanual exam unremarkable Adnexa: Unremarkable Rectal exam: Not done  Wet prep many clue cell few WBC and too numerous to count bacteria  Urinalysis few bacteria, 0-5 WBC, no red blood cell seen  Assessment/plan: Postmenopausal patient with vaginal atrophy and clinical evidence of bacterial vaginosis will be treated with Cleocin vaginal cream to apply daily at bedtime for one week. I have written instructions for the patient to hold off on her vaginal estrogen after she completes two-week treatment of the Cleocin. Urine culture pending.

## 2016-10-01 NOTE — Patient Instructions (Addendum)
Bone Densitometry Bone densitometry is an imaging test that uses a special X-ray to measure the amount of calcium and other minerals in your bones (bone density). This test is also known as a bone mineral density test or dual-energy X-ray absorptiometry (DXA). The test can measure bone density at your hip and your spine. It is similar to having a regular X-ray. You may have this test to:  Diagnose a condition that causes weak or thin bones (osteoporosis).  Predict your risk of a broken bone (fracture).  Determine how well osteoporosis treatment is working. Tell a health care provider about:  Any allergies you have.  All medicines you are taking, including vitamins, herbs, eye drops, creams, and over-the-counter medicines.  Any problems you or family members have had with anesthetic medicines.  Any blood disorders you have.  Any surgeries you have had.  Any medical conditions you have.  Possibility of pregnancy.  Any other medical test you had within the previous 14 days that used contrast material. What are the risks? Generally, this is a safe procedure. However, problems can occur and may include the following:  This test exposes you to a very small amount of radiation.  The risks of radiation exposure may be greater to unborn children. What happens before the procedure?  Do not take any calcium supplements for 24 hours before having the test. You can otherwise eat and drink what you usually do.  Take off all metal jewelry, eyeglasses, dental appliances, and any other metal objects. What happens during the procedure?  You may lie on an exam table. There will be an X-ray generator below you and an imaging device above you.  Other devices, such as boxes or braces, may be used to position your body properly for the scan.  You will need to lie still while the machine slowly scans your body.  The images will show up on a computer monitor. What happens after the  procedure? You may need more testing at a later time. This information is not intended to replace advice given to you by your health care provider. Make sure you discuss any questions you have with your health care provider. Document Released: 07/28/2004 Document Revised: 12/12/2015 Document Reviewed: 12/14/2013 Elsevier Interactive Patient Education  2017 [employer]   Bacterial Vaginosis Bacterial vaginosis is a vaginal infection that occurs when the normal balance of bacteria in the vagina is disrupted. It results from an overgrowth of certain bacteria. This is the most common vaginal infection among women ages 32-44. Because bacterial vaginosis increases your risk for STIs (sexually transmitted infections), getting treated can help reduce your risk for chlamydia, gonorrhea, herpes, and HIV (human immunodeficiency virus). Treatment is also important for preventing complications in pregnant women, because this condition can cause an early (premature) delivery. What are the causes? This condition is caused by an increase in harmful bacteria that are normally present in small amounts in the vagina. However, the reason that the condition develops is not fully understood. What increases the risk? The following factors may make you more likely to develop this condition:  Having a new sexual partner or multiple sexual partners.  Having unprotected sex.  Douching.  Having an intrauterine device (IUD).  Smoking.  Drug and alcohol abuse.  Taking certain antibiotic medicines.  Being pregnant. You cannot get bacterial vaginosis from toilet seats, bedding, swimming pools, or contact with objects around you. What are the signs or symptoms? Symptoms of this condition include:  Grey or white vaginal discharge.  The discharge can also be watery or foamy.  A fish-like odor with discharge, especially after sexual intercourse or during menstruation.  Itching in and around the  vagina.  Burning or pain with urination. Some women with bacterial vaginosis have no signs or symptoms. How is this diagnosed? This condition is diagnosed based on:  Your medical history.  A physical exam of the vagina.  Testing a sample of vaginal fluid under a microscope to look for a large amount of bad bacteria or abnormal cells. Your health care provider may use a cotton swab or a small wooden spatula to collect the sample. How is this treated? This condition is treated with antibiotics. These may be given as a pill, a vaginal cream, or a medicine that is put into the vagina (suppository). If the condition comes back after treatment, a second round of antibiotics may be needed. Follow these instructions at home: Medicines   Take over-the-counter and prescription medicines only as told by your health care provider.  Take or use your antibiotic as told by your health care provider. Do not stop taking or using the antibiotic even if you start to feel better. General instructions   If you have a female sexual partner, tell her that you have a vaginal infection. She should see her health care provider and be treated if she has symptoms. If you have a female sexual partner, he does not need treatment.  During treatment:  Avoid sexual activity until you finish treatment.  Do not douche.  Avoid alcohol as directed by your health care provider.  Avoid breastfeeding as directed by your health care provider.  Drink enough water and fluids to keep your urine clear or pale yellow.  Keep the area around your vagina and rectum clean.  Wash the area daily with warm water.  Wipe yourself from front to back after using the toilet.  Keep all follow-up visits as told by your health care provider. This is important. How is this prevented?  Do not douche.  Wash the outside of your vagina with warm water only.  Use protection when having sex. This includes latex condoms and dental  dams.  Limit how many sexual partners you have. To help prevent bacterial vaginosis, it is best to have sex with just one partner (monogamous).  Make sure you and your sexual partner are tested for STIs.  Wear cotton or cotton-lined underwear.  Avoid wearing tight pants and pantyhose, especially during summer.  Limit the amount of alcohol that you drink.  Do not use any products that contain nicotine or tobacco, such as cigarettes and e-cigarettes. If you need help quitting, ask your health care provider.  Do not use illegal drugs. Where to find more information:  Centers for Disease Control and Prevention: AppraiserFraud.fi  American Sexual Health Association (ASHA): www.ashastd.org  [place] Department of Health and Financial controller, Office on Women's Health: DustingSprays.pl or SecuritiesCard.it Contact a health care provider if:  Your symptoms do not improve, even after treatment.  You have more discharge or pain when urinating.  You have a fever.  You have pain in your abdomen.  You have pain during sex.  You have vaginal bleeding between periods. Summary  Bacterial vaginosis is a vaginal infection that occurs when the normal balance of bacteria in the vagina is disrupted.  Because bacterial vaginosis increases your risk for STIs (sexually transmitted infections), getting treated can help reduce your risk for chlamydia, gonorrhea, herpes, and HIV (human immunodeficiency virus). Treatment is also  important for preventing complications in pregnant women, because the condition can cause an early (premature) delivery.  This condition is treated with antibiotic medicines. These may be given as a pill, a vaginal cream, or a medicine that is put into the vagina (suppository). This information is not intended to replace advice given to you by your health care provider. Make sure you discuss any questions you have with your health care  provider. Document Released: 07/06/2005 Document Revised: 03/21/2016 Document Reviewed: 03/21/2016 Elsevier Interactive Patient Education  2017 [employer].

## 2016-10-01 NOTE — Addendum Note (Signed)
Addended by: Thamas Jaegers on: 10/01/2016 04:41 PM   Modules accepted: Orders

## 2016-10-02 LAB — VITAMIN D 25 HYDROXY (VIT D DEFICIENCY, FRACTURES): Vit D, 25-Hydroxy: 79 ng/mL (ref 30–100)

## 2016-10-03 LAB — URINE CULTURE: Organism ID, Bacteria: NO GROWTH

## 2016-10-14 DIAGNOSIS — M65871 Other synovitis and tenosynovitis, right ankle and foot: Secondary | ICD-10-CM | POA: Diagnosis not present

## 2016-10-14 DIAGNOSIS — M25571 Pain in right ankle and joints of right foot: Secondary | ICD-10-CM | POA: Diagnosis not present

## 2016-11-23 DIAGNOSIS — E1165 Type 2 diabetes mellitus with hyperglycemia: Secondary | ICD-10-CM | POA: Diagnosis not present

## 2016-11-23 DIAGNOSIS — Z5181 Encounter for therapeutic drug level monitoring: Secondary | ICD-10-CM | POA: Diagnosis not present

## 2016-11-23 DIAGNOSIS — E1142 Type 2 diabetes mellitus with diabetic polyneuropathy: Secondary | ICD-10-CM | POA: Diagnosis not present

## 2016-11-23 DIAGNOSIS — M722 Plantar fascial fibromatosis: Secondary | ICD-10-CM | POA: Diagnosis not present

## 2016-11-23 DIAGNOSIS — R69 Illness, unspecified: Secondary | ICD-10-CM | POA: Diagnosis not present

## 2016-11-23 DIAGNOSIS — M71571 Other bursitis, not elsewhere classified, right ankle and foot: Secondary | ICD-10-CM | POA: Diagnosis not present

## 2016-11-23 DIAGNOSIS — Z794 Long term (current) use of insulin: Secondary | ICD-10-CM | POA: Diagnosis not present

## 2016-11-24 ENCOUNTER — Emergency Department (HOSPITAL_COMMUNITY)
Admission: EM | Admit: 2016-11-24 | Discharge: 2016-11-25 | Disposition: A | Discharge status: Home / Self Care | Payer: Medicare HMO | Source: Home / Self Care | Attending: Emergency Medicine | Admitting: Emergency Medicine

## 2016-11-24 DIAGNOSIS — Z794 Long term (current) use of insulin: Secondary | ICD-10-CM

## 2016-11-24 DIAGNOSIS — Z7982 Long term (current) use of aspirin: Secondary | ICD-10-CM

## 2016-11-24 DIAGNOSIS — R1010 Upper abdominal pain, unspecified: Secondary | ICD-10-CM

## 2016-11-24 DIAGNOSIS — E039 Hypothyroidism, unspecified: Secondary | ICD-10-CM | POA: Insufficient documentation

## 2016-11-24 DIAGNOSIS — R339 Retention of urine, unspecified: Secondary | ICD-10-CM

## 2016-11-24 DIAGNOSIS — E1122 Type 2 diabetes mellitus with diabetic chronic kidney disease: Secondary | ICD-10-CM

## 2016-11-24 DIAGNOSIS — R188 Other ascites: Secondary | ICD-10-CM | POA: Insufficient documentation

## 2016-11-24 DIAGNOSIS — Z79899 Other long term (current) drug therapy: Secondary | ICD-10-CM

## 2016-11-24 DIAGNOSIS — R112 Nausea with vomiting, unspecified: Secondary | ICD-10-CM | POA: Diagnosis not present

## 2016-11-24 DIAGNOSIS — I129 Hypertensive chronic kidney disease with stage 1 through stage 4 chronic kidney disease, or unspecified chronic kidney disease: Secondary | ICD-10-CM

## 2016-11-24 DIAGNOSIS — N189 Chronic kidney disease, unspecified: Secondary | ICD-10-CM

## 2016-11-24 DIAGNOSIS — R338 Other retention of urine: Secondary | ICD-10-CM

## 2016-11-24 LAB — COMPREHENSIVE METABOLIC PANEL
ALBUMIN: 4.4 g/dL (ref 3.5–5.0)
ALT: 29 U/L (ref 14–54)
ANION GAP: 12 (ref 5–15)
AST: 28 U/L (ref 15–41)
Alkaline Phosphatase: 62 U/L (ref 38–126)
BILIRUBIN TOTAL: 0.5 mg/dL (ref 0.3–1.2)
BUN: 33 mg/dL — ABNORMAL HIGH (ref 6–20)
CO2: 26 mmol/L (ref 22–32)
Calcium: 10.1 mg/dL (ref 8.9–10.3)
Chloride: 92 mmol/L — ABNORMAL LOW (ref 101–111)
Creatinine, Ser: 1.14 mg/dL — ABNORMAL HIGH (ref 0.44–1.00)
GFR, EST AFRICAN AMERICAN: 51 mL/min — AB (ref >60)
GFR, EST NON AFRICAN AMERICAN: 44 mL/min — AB (ref >60)
GLUCOSE: 381 mg/dL — AB (ref 65–99)
Potassium: 4.4 mmol/L (ref 3.5–5.1)
Sodium: 130 mmol/L — ABNORMAL LOW (ref 135–145)
TOTAL PROTEIN: 7.5 g/dL (ref 6.5–8.1)

## 2016-11-24 LAB — CBC
HCT: 30.8 % — ABNORMAL LOW (ref 36.0–46.0)
HEMOGLOBIN: 10.1 g/dL — AB (ref 12.0–15.0)
MCH: 30.1 pg (ref 26.0–34.0)
MCHC: 32.8 g/dL (ref 30.0–36.0)
MCV: 91.7 fL (ref 78.0–100.0)
Platelets: 289 10*3/uL (ref 150–400)
RBC: 3.36 MIL/uL — AB (ref 3.87–5.11)
RDW: 13.3 % (ref 11.5–15.5)
WBC: 7.2 10*3/uL (ref 4.0–10.5)

## 2016-11-24 LAB — LIPASE, BLOOD: Lipase: 77 U/L — ABNORMAL HIGH (ref 11–51)

## 2016-11-24 MED ORDER — ONDANSETRON 4 MG PO TBDP
4.0000 mg | ORAL_TABLET | Freq: Once | ORAL | Status: AC | PRN
  Administered 2016-11-24: 4 mg via ORAL
  Filled 2016-11-24: qty 1

## 2016-11-24 NOTE — ED Notes (Signed)
Pt reports zofran is not helping with nausea

## 2016-11-24 NOTE — ED Notes (Signed)
Pt c/o vomiting and generalized abd pain since since 1800 today. Denies diarrhea.

## 2016-11-25 ENCOUNTER — Emergency Department (HOSPITAL_COMMUNITY): Payer: Medicare HMO

## 2016-11-25 ENCOUNTER — Encounter (HOSPITAL_COMMUNITY): Payer: Self-pay

## 2016-11-25 ENCOUNTER — Inpatient Hospital Stay (HOSPITAL_COMMUNITY)
Admission: EM | Admit: 2016-11-25 | Discharge: 2016-12-09 | DRG: 673 | Disposition: A | Discharge status: Skilled Nursing Facility | Payer: Medicare HMO | Attending: Internal Medicine | Admitting: Internal Medicine

## 2016-11-25 ENCOUNTER — Encounter (HOSPITAL_COMMUNITY): Payer: Self-pay | Admitting: Emergency Medicine

## 2016-11-25 DIAGNOSIS — Z90710 Acquired absence of both cervix and uterus: Secondary | ICD-10-CM

## 2016-11-25 DIAGNOSIS — K219 Gastro-esophageal reflux disease without esophagitis: Secondary | ICD-10-CM | POA: Diagnosis not present

## 2016-11-25 DIAGNOSIS — Z8249 Family history of ischemic heart disease and other diseases of the circulatory system: Secondary | ICD-10-CM

## 2016-11-25 DIAGNOSIS — K56609 Unspecified intestinal obstruction, unspecified as to partial versus complete obstruction: Secondary | ICD-10-CM

## 2016-11-25 DIAGNOSIS — E1010 Type 1 diabetes mellitus with ketoacidosis without coma: Secondary | ICD-10-CM | POA: Diagnosis not present

## 2016-11-25 DIAGNOSIS — N182 Chronic kidney disease, stage 2 (mild): Secondary | ICD-10-CM | POA: Diagnosis present

## 2016-11-25 DIAGNOSIS — R109 Unspecified abdominal pain: Secondary | ICD-10-CM

## 2016-11-25 DIAGNOSIS — E785 Hyperlipidemia, unspecified: Secondary | ICD-10-CM | POA: Diagnosis not present

## 2016-11-25 DIAGNOSIS — E039 Hypothyroidism, unspecified: Secondary | ICD-10-CM | POA: Diagnosis present

## 2016-11-25 DIAGNOSIS — E784 Other hyperlipidemia: Secondary | ICD-10-CM | POA: Diagnosis not present

## 2016-11-25 DIAGNOSIS — E43 Unspecified severe protein-calorie malnutrition: Secondary | ICD-10-CM | POA: Diagnosis present

## 2016-11-25 DIAGNOSIS — E871 Hypo-osmolality and hyponatremia: Secondary | ICD-10-CM | POA: Diagnosis present

## 2016-11-25 DIAGNOSIS — E1140 Type 2 diabetes mellitus with diabetic neuropathy, unspecified: Secondary | ICD-10-CM | POA: Diagnosis not present

## 2016-11-25 DIAGNOSIS — R338 Other retention of urine: Secondary | ICD-10-CM | POA: Diagnosis not present

## 2016-11-25 DIAGNOSIS — G9341 Metabolic encephalopathy: Secondary | ICD-10-CM | POA: Diagnosis present

## 2016-11-25 DIAGNOSIS — M170 Bilateral primary osteoarthritis of knee: Secondary | ICD-10-CM | POA: Diagnosis present

## 2016-11-25 DIAGNOSIS — R509 Fever, unspecified: Secondary | ICD-10-CM | POA: Diagnosis not present

## 2016-11-25 DIAGNOSIS — I10 Essential (primary) hypertension: Secondary | ICD-10-CM | POA: Diagnosis present

## 2016-11-25 DIAGNOSIS — R778 Other specified abnormalities of plasma proteins: Secondary | ICD-10-CM | POA: Diagnosis present

## 2016-11-25 DIAGNOSIS — Z882 Allergy status to sulfonamides status: Secondary | ICD-10-CM

## 2016-11-25 DIAGNOSIS — R188 Other ascites: Secondary | ICD-10-CM | POA: Diagnosis present

## 2016-11-25 DIAGNOSIS — E1110 Type 2 diabetes mellitus with ketoacidosis without coma: Secondary | ICD-10-CM | POA: Diagnosis not present

## 2016-11-25 DIAGNOSIS — R1110 Vomiting, unspecified: Secondary | ICD-10-CM | POA: Diagnosis not present

## 2016-11-25 DIAGNOSIS — E875 Hyperkalemia: Secondary | ICD-10-CM | POA: Diagnosis present

## 2016-11-25 DIAGNOSIS — E11649 Type 2 diabetes mellitus with hypoglycemia without coma: Secondary | ICD-10-CM

## 2016-11-25 DIAGNOSIS — R140 Abdominal distension (gaseous): Secondary | ICD-10-CM

## 2016-11-25 DIAGNOSIS — N390 Urinary tract infection, site not specified: Secondary | ICD-10-CM | POA: Diagnosis present

## 2016-11-25 DIAGNOSIS — R278 Other lack of coordination: Secondary | ICD-10-CM | POA: Diagnosis not present

## 2016-11-25 DIAGNOSIS — K56600 Partial intestinal obstruction, unspecified as to cause: Secondary | ICD-10-CM | POA: Diagnosis not present

## 2016-11-25 DIAGNOSIS — Z79899 Other long term (current) drug therapy: Secondary | ICD-10-CM | POA: Diagnosis not present

## 2016-11-25 DIAGNOSIS — E876 Hypokalemia: Secondary | ICD-10-CM | POA: Diagnosis present

## 2016-11-25 DIAGNOSIS — I119 Hypertensive heart disease without heart failure: Secondary | ICD-10-CM | POA: Diagnosis not present

## 2016-11-25 DIAGNOSIS — N179 Acute kidney failure, unspecified: Secondary | ICD-10-CM | POA: Diagnosis present

## 2016-11-25 DIAGNOSIS — Z87891 Personal history of nicotine dependence: Secondary | ICD-10-CM

## 2016-11-25 DIAGNOSIS — Z0189 Encounter for other specified special examinations: Secondary | ICD-10-CM

## 2016-11-25 DIAGNOSIS — R112 Nausea with vomiting, unspecified: Secondary | ICD-10-CM | POA: Diagnosis not present

## 2016-11-25 DIAGNOSIS — Z48815 Encounter for surgical aftercare following surgery on the digestive system: Secondary | ICD-10-CM | POA: Diagnosis not present

## 2016-11-25 DIAGNOSIS — Z7983 Long term (current) use of bisphosphonates: Secondary | ICD-10-CM

## 2016-11-25 DIAGNOSIS — E1165 Type 2 diabetes mellitus with hyperglycemia: Secondary | ICD-10-CM | POA: Diagnosis not present

## 2016-11-25 DIAGNOSIS — T83511A Infection and inflammatory reaction due to indwelling urethral catheter, initial encounter: Principal | ICD-10-CM | POA: Diagnosis present

## 2016-11-25 DIAGNOSIS — Z833 Family history of diabetes mellitus: Secondary | ICD-10-CM

## 2016-11-25 DIAGNOSIS — E119 Type 2 diabetes mellitus without complications: Secondary | ICD-10-CM

## 2016-11-25 DIAGNOSIS — M25551 Pain in right hip: Secondary | ICD-10-CM

## 2016-11-25 DIAGNOSIS — K5900 Constipation, unspecified: Secondary | ICD-10-CM | POA: Diagnosis present

## 2016-11-25 DIAGNOSIS — M810 Age-related osteoporosis without current pathological fracture: Secondary | ICD-10-CM | POA: Diagnosis present

## 2016-11-25 DIAGNOSIS — Z794 Long term (current) use of insulin: Secondary | ICD-10-CM | POA: Diagnosis not present

## 2016-11-25 DIAGNOSIS — R2681 Unsteadiness on feet: Secondary | ICD-10-CM | POA: Diagnosis not present

## 2016-11-25 DIAGNOSIS — E1159 Type 2 diabetes mellitus with other circulatory complications: Secondary | ICD-10-CM | POA: Diagnosis present

## 2016-11-25 DIAGNOSIS — Z888 Allergy status to other drugs, medicaments and biological substances status: Secondary | ICD-10-CM

## 2016-11-25 DIAGNOSIS — R29810 Facial weakness: Secondary | ICD-10-CM

## 2016-11-25 DIAGNOSIS — K565 Intestinal adhesions [bands], unspecified as to partial versus complete obstruction: Secondary | ICD-10-CM | POA: Diagnosis present

## 2016-11-25 DIAGNOSIS — R52 Pain, unspecified: Secondary | ICD-10-CM | POA: Diagnosis not present

## 2016-11-25 DIAGNOSIS — R410 Disorientation, unspecified: Secondary | ICD-10-CM | POA: Diagnosis not present

## 2016-11-25 DIAGNOSIS — Z7982 Long term (current) use of aspirin: Secondary | ICD-10-CM | POA: Diagnosis not present

## 2016-11-25 DIAGNOSIS — Z6821 Body mass index (BMI) 21.0-21.9, adult: Secondary | ICD-10-CM

## 2016-11-25 DIAGNOSIS — R1010 Upper abdominal pain, unspecified: Secondary | ICD-10-CM | POA: Diagnosis not present

## 2016-11-25 DIAGNOSIS — K5651 Intestinal adhesions [bands], with partial obstruction: Secondary | ICD-10-CM | POA: Diagnosis not present

## 2016-11-25 DIAGNOSIS — K5649 Other impaction of intestine: Secondary | ICD-10-CM | POA: Diagnosis not present

## 2016-11-25 DIAGNOSIS — Z4682 Encounter for fitting and adjustment of non-vascular catheter: Secondary | ICD-10-CM | POA: Diagnosis not present

## 2016-11-25 DIAGNOSIS — L89322 Pressure ulcer of left buttock, stage 2: Secondary | ICD-10-CM | POA: Diagnosis not present

## 2016-11-25 DIAGNOSIS — R030 Elevated blood-pressure reading, without diagnosis of hypertension: Secondary | ICD-10-CM | POA: Diagnosis not present

## 2016-11-25 DIAGNOSIS — R339 Retention of urine, unspecified: Secondary | ICD-10-CM | POA: Diagnosis not present

## 2016-11-25 DIAGNOSIS — I959 Hypotension, unspecified: Secondary | ICD-10-CM | POA: Diagnosis present

## 2016-11-25 DIAGNOSIS — R4182 Altered mental status, unspecified: Secondary | ICD-10-CM | POA: Diagnosis not present

## 2016-11-25 DIAGNOSIS — L8990 Pressure ulcer of unspecified site, unspecified stage: Secondary | ICD-10-CM | POA: Insufficient documentation

## 2016-11-25 DIAGNOSIS — A419 Sepsis, unspecified organism: Secondary | ICD-10-CM | POA: Diagnosis not present

## 2016-11-25 DIAGNOSIS — M6281 Muscle weakness (generalized): Secondary | ICD-10-CM | POA: Diagnosis not present

## 2016-11-25 DIAGNOSIS — R531 Weakness: Secondary | ICD-10-CM | POA: Diagnosis not present

## 2016-11-25 HISTORY — DX: Other ascites: R18.8

## 2016-11-25 LAB — URINALYSIS, ROUTINE W REFLEX MICROSCOPIC
BILIRUBIN URINE: NEGATIVE
BILIRUBIN URINE: NEGATIVE
Bacteria, UA: NONE SEEN
Bacteria, UA: NONE SEEN
Glucose, UA: >=500 mg/dL — AB
Glucose, UA: >=500 mg/dL — AB
HGB URINE DIPSTICK: NEGATIVE
Ketones, ur: 20 mg/dL — AB
Ketones, ur: 20 mg/dL — AB
LEUKOCYTES UA: NEGATIVE
Leukocytes, UA: NEGATIVE
NITRITE: NEGATIVE
NITRITE: NEGATIVE
Protein, ur: NEGATIVE mg/dL
Protein, ur: 100 mg/dL — AB
SPECIFIC GRAVITY, URINE: 1.014 (ref 1.005–1.030)
SPECIFIC GRAVITY, URINE: 1.024 (ref 1.005–1.030)
Squamous Epithelial / LPF: NONE SEEN
WBC UA: NONE SEEN WBC/hpf (ref 0–5)
pH: 6 (ref 5.0–8.0)
pH: 5 (ref 5.0–8.0)

## 2016-11-25 LAB — CBC
HEMATOCRIT: 35.2 % — AB (ref 36.0–46.0)
HEMOGLOBIN: 11.7 g/dL — AB (ref 12.0–15.0)
MCH: 30.9 pg (ref 26.0–34.0)
MCHC: 33.2 g/dL (ref 30.0–36.0)
MCV: 92.9 fL (ref 78.0–100.0)
Platelets: 286 10*3/uL (ref 150–400)
RBC: 3.79 MIL/uL — AB (ref 3.87–5.11)
RDW: 13.8 % (ref 11.5–15.5)
WBC: 12 10*3/uL — ABNORMAL HIGH (ref 4.0–10.5)

## 2016-11-25 LAB — CBG MONITORING, ED
GLUCOSE-CAPILLARY: 505 mg/dL — AB (ref 65–99)
GLUCOSE-CAPILLARY: 534 mg/dL — AB (ref 65–99)
GLUCOSE-CAPILLARY: 527 mg/dL — AB (ref 65–99)
Glucose-Capillary: 499 mg/dL — ABNORMAL HIGH (ref 65–99)

## 2016-11-25 LAB — I-STAT CG4 LACTIC ACID, ED
Lactic Acid, Venous: 3.39 mmol/L (ref 0.5–1.9)
Lactic Acid, Venous: 2.94 mmol/L (ref 0.5–1.9)

## 2016-11-25 LAB — AMMONIA: AMMONIA: 14 umol/L (ref 9–35)

## 2016-11-25 LAB — ETHANOL: Alcohol, Ethyl (B): <5 mg/dL (ref <5)

## 2016-11-25 MED ORDER — VANCOMYCIN HCL IN DEXTROSE 750-5 MG/150ML-% IV SOLN: 750.0000 mg | INTRAVENOUS | Status: DC

## 2016-11-25 MED ORDER — PIPERACILLIN-TAZOBACTAM 3.375 G IVPB
3.3750 g | Freq: Three times a day (TID) | INTRAVENOUS | Status: DC
  Administered 2016-11-26 (×2): 3.375 g via INTRAVENOUS
  Filled 2016-11-25 (×4): qty 50

## 2016-11-25 MED ORDER — PIPERACILLIN-TAZOBACTAM 3.375 G IVPB 30 MIN
3.3750 g | Freq: Once | INTRAVENOUS | Status: AC
  Administered 2016-11-25: 3.375 g via INTRAVENOUS
  Filled 2016-11-25: qty 50

## 2016-11-25 MED ORDER — ONDANSETRON HCL 4 MG/2ML IJ SOLN
4.0000 mg | Freq: Once | INTRAMUSCULAR | Status: AC
  Administered 2016-11-25: 4 mg via INTRAVENOUS
  Filled 2016-11-25: qty 2

## 2016-11-25 MED ORDER — SODIUM CHLORIDE 0.9 % IV BOLUS (SEPSIS)
1000.0000 mL | Freq: Once | INTRAVENOUS | Status: AC
Start: 2016-11-25 — End: 2016-11-25
  Administered 2016-11-25: 1000 mL via INTRAVENOUS

## 2016-11-25 MED ORDER — IOPAMIDOL (ISOVUE-300) INJECTION 61%
INTRAVENOUS | Status: AC
  Administered 2016-11-25: 100 mL via INTRAVENOUS
  Filled 2016-11-25: qty 100

## 2016-11-25 MED ORDER — VANCOMYCIN HCL IN DEXTROSE 1-5 GM/200ML-% IV SOLN
1000.0000 mg | Freq: Once | INTRAVENOUS | Status: AC
  Administered 2016-11-25: 1000 mg via INTRAVENOUS
  Filled 2016-11-25: qty 200

## 2016-11-25 MED ORDER — SODIUM CHLORIDE 0.9 % IV SOLN
INTRAVENOUS | Status: DC
  Administered 2016-11-25: 4.7 [IU]/h via INTRAVENOUS
  Filled 2016-11-25: qty 1

## 2016-11-25 MED ORDER — FENTANYL CITRATE (PF) 100 MCG/2ML IJ SOLN
50.0000 ug | Freq: Once | INTRAMUSCULAR | Status: AC
  Administered 2016-11-25: 50 ug via INTRAVENOUS
  Filled 2016-11-25: qty 2

## 2016-11-25 MED ORDER — SODIUM CHLORIDE 0.9 % IV BOLUS (SEPSIS)
1000.0000 mL | Freq: Once | INTRAVENOUS | Status: AC
  Administered 2016-11-25: 1000 mL via INTRAVENOUS

## 2016-11-25 MED ORDER — ONDANSETRON 4 MG PO TBDP: 4.0000 mg | ORAL_TABLET | Freq: Three times a day (TID) | ORAL | 0 refills | Status: DC | PRN

## 2016-11-25 MED ORDER — DEXTROSE-NACL 5-0.45 % IV SOLN: INTRAVENOUS | Status: DC

## 2016-11-25 MED ORDER — IOPAMIDOL (ISOVUE-300) INJECTION 61%
100.0000 mL | Freq: Once | INTRAVENOUS | Status: AC | PRN
  Administered 2016-11-25: 100 mL via INTRAVENOUS

## 2016-11-25 NOTE — H&P (Signed)
History and Physical  Patient Name: [name] [name]     WSF:[id number]    DOB: [birth year]    DOA: 11/25/2016 PCP: Aura Dials, MD   Patient coming from: Home  Chief Complaint: Confusion, malaise, abdominal pain  HPI: [name] [name] is a 83 y.o. female with a past medical history significant for IDDM, hypothyroidism and HTN who presents with 2 days abdominal pain, malaise and now confusion.  All history is collected from the patient's daughter, as the patient is obtunded.  Per daughter on Monday patient saw her primary doctor who decreased her insulin because of night time lows or highs (she is not sure). Then Tuesday evening on the phone, the patient seemed a little bit "agitated" or "tired" and later that night she called her daughter complaining of some abdominal discomfort, "upset stomach", and asked to be taken to the emergency room.  In the ER, she was quite hyperglycemic but bicarb and gap normal, no leukocytosis, normal renal function.  Lipase elevated but CT unremarkable except moderate ascites.  It seems she felt better and was prepared for discharge, only out of the ordinary finding that she was given a foley bag for retained urine.  Per daughter, she slep this morning, and then in the afternoon got up and was altered, totally confused, pulled out her foley and had some gross hematuria (at baseline she lives alone, drives, does her finances online, is totally independent, manages her own meds).  Daughter is aware of no fever, cough, sputum production, dyrsuria, urinary irritation, recent diarrhea (although she has episodes from time to time).  ED course: -Temp 100.58F, heart rate 106, respirations 25, BP 176/68, pulse ox normal -Na 129, glucose 596, K 5.8, Cr 1.4 (baseline 1.1), WBC 12K, Hgb 11.7 -UA showed hematuria, no WBCs or nitrites -CXR clear -Alcohol and ammonia normal -Lactic acid 3.39 --> 2.94 -CT head unremarkable -She was given 2L NS and started on insulin drip for  DKA -She was given vancomycin and Zosyn for possible sepsis unclear source and blood and urine cultures were drawn -TRH were asked to evaluaete for DKA and possible sepsis    ROS: Review of Systems  Unable to perform ROS: Medical condition          Past Medical History:  Diagnosis Date  . Arthritis    "knees, legs" (06/10/2016)  . Ascites   . Chronic kidney disease    "related to my diabetes"  . Chronic lower back pain   . Diabetes mellitus without complication ([place])   . GERD (gastroesophageal reflux disease)   . High cholesterol   . Hyperlipidemia   . Hypertension   . Hypothyroidism   . OAB (overactive bladder)   . Osteoporosis   . Thyroid disease   . Type II diabetes mellitus ([place])     Past Surgical History:  Procedure Laterality Date  . ABDOMINAL HYSTERECTOMY    . KNEE ARTHROSCOPY    . vocal cord polyps      Social History: Patient lives alone.  The patient walks unassisted without cane or walker.  She is from [place].  She worked in [employer] for [employer].  Was not a smoker.  Per chart, used smokeless tobacco many years ago.  Per family no alcohol.  Livse alone, still drives, mild memory issues "like any 83 year old" but still manages her own medicines and still manages her own finances online.    Allergies  Allergen Reactions  . Lisinopril Cough    Reported by  [employer]  . Sulfa Antibiotics Hives  . Sulfonamide Derivatives Hives  . Bactrim [Sulfamethoxazole-Trimethoprim] Rash    Reported by [facility]    Family history: family history includes Diabetes in her brother, mother, and sister; Hypertension in her mother.  Prior to Admission medications   Medication Sig Start Date End Date Taking? Authorizing Provider  acetaminophen (TYLENOL) 500 MG tablet Take 1,000 mg by mouth every 6 (six) hours as needed (pain).   Yes [provider]  alendronate (FOSAMAX) 70 MG tablet Take 70 mg by mouth every 7 (seven) days.  03/11/13  Yes [provider]  aspirin EC 81 MG tablet Take 81 mg by mouth daily.   Yes [provider]  Calcium Carbonate-Vitamin D (CALCIUM-D PO) Take 2 tablets by mouth 2 (two) times daily.   Yes [provider]  cholecalciferol (VITAMIN D) 1000 UNITS tablet Take 1,000 Units by mouth daily.   Yes [provider]  clindamycin (CLEOCIN) 2 % vaginal cream Place 1 Applicatorful vaginally at bedtime. 10/01/16  Yes Terrance Mass, MD  cyclobenzaprine (FLEXERIL) 5 MG tablet Take 5 mg by mouth 3 (three) times daily as needed for muscle spasms.   Yes [provider]  furosemide (LASIX) 20 MG tablet Take 20 mg by mouth every other day. Monday, Wednesday, and Friday 10/26/14  Yes [provider]  insulin aspart (NOVOLOG) 100 UNIT/ML injection Inject 6 Units into the skin 3 (three) times daily before meals. 05/06/15  Yes Funches, Josalyn, MD  insulin aspart (NOVOLOG) 100 UNIT/ML injection Inject 7-8 Units into the skin See admin instructions. Inject 7 units subcutaneously at breakfast and lunch and 8 units at supper   Yes [provider]  Insulin Detemir (LEVEMIR FLEXTOUCH) 100 UNIT/ML Pen Inject 8 Units into the skin 2 (two) times daily. 05/06/15  Yes Funches, Josalyn, MD  Insulin Detemir (LEVEMIR FLEXTOUCH) 100 UNIT/ML Pen Inject 6-7 Units into the skin See admin instructions. Inject 7 units subcutaneously every morning and 6 units at night   Yes [provider]  iron polysaccharides (NIFEREX) 150 MG capsule Take 150 mg by mouth 2 (two) times daily.    Yes [provider]  levothyroxine (SYNTHROID, LEVOTHROID) 100 MCG tablet Take 1 tablet (100 mcg total) by mouth daily. 09/25/14  Yes Advani, Vernon Prey, MD  levothyroxine (SYNTHROID, LEVOTHROID) 100 MCG tablet Take 100 mcg by mouth daily before breakfast.   Yes [provider]  loratadine (CLARITIN) 10 MG tablet Take 10 mg by mouth every other day.    Yes [provider]  losartan (COZAAR)  25 MG tablet Take 1 tablet (25 mg total) by mouth daily. 09/25/14  Yes Advani, Vernon Prey, MD  losartan (COZAAR) 25 MG tablet Take 25 mg by mouth daily.   Yes [provider]  meclizine (ANTIVERT) 12.5 MG tablet Take 1 tablet (12.5 mg total) by mouth 3 (three) times daily as needed for dizziness. 11/13/14  Yes Lacretia Leigh, MD  metFORMIN (GLUCOPHAGE) 1000 MG tablet Take 1,000 mg by mouth 2 (two) times daily with a meal.   Yes [provider]  mometasone (NASONEX) 50 MCG/ACT nasal spray Place 2 sprays into the nose 2 (two) times daily as needed (seasonal allergies).   Yes [provider]  naproxen sodium (ALEVE) 220 MG tablet Take 1 tablet (220 mg total) by mouth 2 (two) times daily with a meal. Patient taking differently: Take 440 mg by mouth 2 (two) times daily as needed (pain).  05/06/15  Yes Funches,  Josalyn, MD  NONFORMULARY OR COMPOUNDED ITEM Estradiol 0.02 % 60ml prefilled applicator Sig: apply twice a week 01/20/16  Yes Terrance Mass, MD  nystatin (MYCOSTATIN) 100000 UNIT/ML suspension Take 5 mLs by mouth 2 (two) times daily. For mouth 10/12/14  Yes [provider]  Omega-3 Fatty Acids (FISH OIL PO) Take 1 capsule by mouth 2 (two) times daily.   Yes [provider]  ondansetron (ZOFRAN ODT) 4 MG disintegrating tablet Take 1 tablet (4 mg total) by mouth every 8 (eight) hours as needed for nausea or vomiting. 11/25/16  Yes Sherwood Gambler, MD  pantoprazole (PROTONIX) 40 MG tablet Take 1 tablet (40 mg total) by mouth daily. 09/25/14  Yes Advani, Vernon Prey, MD  pantoprazole (PROTONIX) 40 MG tablet Take 40 mg by mouth daily.   Yes [provider]  polyethylene glycol (MIRALAX / GLYCOLAX) packet Take 17 g by mouth every Monday, Wednesday, and Friday. Mix in 8 oz liquid and drink   Yes [provider]  simvastatin (ZOCOR) 40 MG tablet TAKE 1 TABLET BY MOUTH EVERY EVENING. NEED OFFICE VISIT FOR MORE REFILLS 08/12/15  Yes Funches, Josalyn, MD    simvastatin (ZOCOR) 40 MG tablet Take 40 mg by mouth every evening.   Yes [provider]       Physical Exam: BP (!) 154/69   Pulse (!) 120   Temp (!) 100.5 F (38.1 C) (Axillary)   Resp (!) 38   Ht 5\' 9"  (1.753 m)   Wt 63.5 kg (140 lb)   SpO2 93%   BMI 20.67 kg/m  General appearance: Elderly adult female, obtunded, restless, sluggish.   Eyes: Anicteric, conjunctiva pink, lids and lashes normal. PERRL.    ENT: No nasal deformity, discharge, epistaxis.  Hearing unable to assess. OP tacky dry without lesions.   Neck: No neck masses.  Trachea midline.  No thyromegaly/tenderness. Lymph: No cervical or supraclavicular lymphadenopathy. Skin: Warm and dry and flushed.   No suspicious rashes or lesions. Cardiac: Tachycardic, regular, nl S1-S2, no murmurs appreciated.  Capillary refill is brisk.  JVP normal.  No LE edema.  Radial and DP pulses 2+ and symmetric. Respiratory: Normal respiratory rate and rhythm.  CTAB without rales or wheezes. Abdomen: Abdomen soft.  No obvious TTP or grimace to exam. No ascites clinically, distension, hepatosplenomegaly.   MSK: No deformities or effusions.  No cyanosis or clubbing. Neuro: Pupils equal.  Sems to sluggishly make eye contact sometimes.  Bats me away sometimes, no other obvious purposeful movements or utterances.  Does not follow commands. Psych: Unable to assess.     Labs on Admission:  I have personally reviewed following labs and imaging studies: CBC:  Recent Labs Lab 11/24/16 2159 11/25/16 1506  WBC 7.2 12.0*  HGB 10.1* 11.7*  HCT 30.8* 35.2*  MCV 91.7 92.9  PLT 289 242   Basic Metabolic Panel:  Recent Labs Lab 11/24/16 2159 11/25/16 1506  NA 130* 129*  K 4.4 5.8*  CL 92* 92*  CO2 26 17*  GLUCOSE 381* 596*  BUN 33* 36*  CREATININE 1.14* 1.43*  CALCIUM 10.1 9.7   GFR: Estimated Creatinine Clearance: 31.5 mL/min (A) (by C-G formula based on SCr of 1.43 mg/dL (H)).  Liver Function Tests:  Recent  Labs Lab 11/24/16 2159 11/25/16 1506  AST 28 27  ALT 29 29  ALKPHOS 62 71  BILITOT 0.5 1.1  PROT 7.5 7.4  ALBUMIN 4.4 4.2    Recent Labs Lab 11/24/16 2159  LIPASE 77*  Recent Labs Lab 11/25/16 1608  AMMONIA 14   Coagulation Profile: No results for input(s): INR, PROTIME in the last 168 hours. Cardiac Enzymes: No results for input(s): CKTOTAL, CKMB, CKMBINDEX, TROPONINI in the last 168 hours. BNP (last 3 results) No results for input(s): PROBNP in the last 8760 hours. HbA1C: No results for input(s): HGBA1C in the last 72 hours. CBG:  Recent Labs Lab 11/25/16 1528 11/25/16 2002 11/25/16 2159 11/25/16 2303  GLUCAP 505* 534* 527* 499*   Lipid Profile: No results for input(s): CHOL, HDL, LDLCALC, TRIG, CHOLHDL, LDLDIRECT in the last 72 hours. Thyroid Function Tests: No results for input(s): TSH, T4TOTAL, FREET4, T3FREE, THYROIDAB in the last 72 hours. Anemia Panel: No results for input(s): VITAMINB12, FOLATE, FERRITIN, TIBC, IRON, RETICCTPCT in the last 72 hours. Sepsis Labs: Lactic acid 3.3 --> 2.9 Invalid input(s): PROCALCITONIN, LACTICIDVEN No results found for this or any previous visit (from the past 240 hour(s)).       Radiological Exams on Admission: Personally reviewed CXR shows no focal opacity, lower atelectasis; CT head report reviewed: Ct Head Wo Contrast  Result Date: 11/25/2016 CLINICAL DATA:  Altered mental status. EXAM: CT HEAD WITHOUT CONTRAST TECHNIQUE: Contiguous axial images were obtained from the base of the skull through the vertex without intravenous contrast. COMPARISON:  Head CT 11/03/2013 FINDINGS: Brain: No evidence of acute infarction, hemorrhage, hydrocephalus, extra-axial collection or mass lesion/mass effect. Stable degree of atrophy and chronic last. Vascular: Atherosclerosis of skullbase vasculature without hyperdense vessel or abnormal calcification. Skull: No fracture or focal lesion. Sinuses/Orbits: Minimal chronic opacification  of lower left mastoid air cells. Right mastoid air cells are clear. Visualized orbits and paranasal sinuses are unremarkable. Other: None. IMPRESSION: Unchanged atrophy and chronic small vessel ischemia. No acute intracranial abnormality. Electronically Signed   By: Jeb Levering M.D.   On: 11/25/2016 19:34   Ct Abdomen Pelvis W Contrast  Result Date: 11/25/2016 CLINICAL DATA:  Abdominal pain, cramping, nausea, and vomiting for several hours. Elevated blood sugar. Elevated lipase. History of hypertension and diabetes. EXAM: CT ABDOMEN AND PELVIS WITH CONTRAST TECHNIQUE: Multidetector CT imaging of the abdomen and pelvis was performed using the standard protocol following bolus administration of intravenous contrast. CONTRAST:  100 mL Isovue-300 COMPARISON:  12/04/2005 FINDINGS: Lower chest: Mild dependent changes in the lung bases. Distal esophageal wall thickening may indicate reflux disease. Hepatobiliary: There is a calcification in the porta hepatis which could represent a cystic duct stone. However, there is no evidence of gallbladder distention or inflammatory change. No bile duct dilatation. No focal liver lesions. Pancreas: Unremarkable. No pancreatic ductal dilatation or surrounding inflammatory changes. Spleen: Normal in size without focal abnormality. Adrenals/Urinary Tract: No adrenal gland nodules. Small cyst on the left kidney. No hydronephrosis or hydroureter. Renal nephrograms are symmetrical. Bladder is unremarkable. Stomach/Bowel: The stomach is decompressed. Gastric wall appears somewhat thickened but this may just be due to underdistention. Gastritis is not excluded. Small bowel are decompressed. Diffusely stool-filled colon without abnormal distention or wall thickening. Appendix is normal. Vascular/Lymphatic: Aortic atherosclerosis. No enlarged abdominal or pelvic lymph nodes. Reproductive: Status post hysterectomy. No adnexal masses. Other: Diffuse free fluid throughout the abdomen and  pelvis suggesting ascites. No free air. Abdominal wall musculature appears intact. Musculoskeletal: Degenerative changes in the spine. No destructive bone lesions. IMPRESSION: Moderate diffuse abdominal and pelvic ascites. Stomach is decompressed. Possible gastric wall thickening although this could be due to under distention. Can't exclude gastritis. No evidence of bowel obstruction. Electronically Signed   By: Gwyndolyn Saxon  Gerilyn Nestle M.D.   On: 11/25/2016 03:48   Dg Chest Port 1 View  Result Date: 11/25/2016 CLINICAL DATA:  Acute onset of altered mental status. Urinary retention. Initial encounter. EXAM: PORTABLE CHEST 1 VIEW COMPARISON:  Chest radiograph performed 02/12/2016 FINDINGS: The lungs are well-aerated and clear. There is no evidence of focal opacification, pleural effusion or pneumothorax. The cardiomediastinal silhouette is within normal limits. No acute osseous abnormalities are seen. Calcification overlying the right humeral head likely reflects calcific tendinitis. IMPRESSION: 1. No acute cardiopulmonary process seen. 2. Calcific tendinitis at the right rotator cuff. Electronically Signed   By: Garald Balding M.D.   On: 11/25/2016 18:42    EKG: Independently reviewed. Rate 98, QTc 447, sinus rhythm, no ST changes        Assessment/Plan  1. Diabetic ketoacidosis and type 2 diabetes:  Suspect this is from decreasing her insulin this week.  Given age and ?cognitive impairment, perhaps also med error/omission by patient.  Sepsis may also be driving this, or pancreatitis, as below.   -Insulin gtt -BMP q4hrs -Check mag, phos -Aggressive IVF -Hold Levemir, home short-acting and metformin   2. Presumed sepsis:  Leukocytosis, low grade temp, SIRS criteria, elevated lactic acid.  Lactate likely exacerbated by Metfromin.  Unclear what source would be as UA completely normal within 24 hours, CXR clear.   -Empiric vancomycin and Zosyn for now -Fluids and trend lactic acid -Check  procalcitonin -Low threshold to de-escalate antibiotics  3. Hyperkalemia:  From DKA, AKI -Fluids, insulin and close electrolytes monitoring overnight  4. Mild acute kidney injury:  From DKA. -IVF, trend Cr  5. Hypertension:  -Hold ACEi given AKI -Continue statin  6. Delirium:  From DKA, possilbe sepsis  7. Hypothyroidism:  -Continue levothyroxine  8. Abdominal pain: Lipase suggest pancreatitis, but CT last night normal. No findings on exam to suggest gallbladder disease. Abdominal pain could his sputum DKA. Clinically not enough ascites to suggest SBP. -Trend lipase, if up-trending, consider repeat CT abdomen  9. Ascites: Noticed on CT. Transvaginal US last September because of pelvic pain by her gynecologist was normal. -Follow up with GYN as outpaitent      DVT prophylaxis: Lovenox  Code Status: FULL  Family Communication: Daughter at [place], code status confirmed, overnight plan discussed  Disposition Plan: Anticipate IV fluids, IV insulin, close electrolyte monitoring.  Empiric treatment for sepsis while cultures incubate, low threshold to de-escalte.   Consults called: None Admission status: INPATIENT         Medical decision making: Patient seen at 11:15 PM on 11/25/2016.  The patient was discussed with Dr. Eulis Foster.  What exists of the patient's chart was reviewed in depth and summarized above.  Clinical condition: obtunded, tachycardic.  BP good and respiratory status good, to stepdown given frequent monitoring needs.        Edwin Dada [facility] Pager [phone]     At the time of admission, it appears that the appropriate admission status for this patient is INPATIENT. This is judged to be reasonable and necessary in order to provide the required intensity of service to ensure the patient's safety given the presenting symptoms, physical exam findings, and initial radiographic and laboratory data in the context of their chronic  comorbidities.  Together, these circumstances are felt to place her at high risk for further clinical deterioration threatening life, limb, or organ.   Patient requires inpatient status due to high intensity of service, high risk for further deterioration and high frequency of surveillance  required because of this acute illness that poses a threat to life, limb or bodily function.  Factors to support inpatient status include diabetic ketoacidosis, elevated lactic acidosis, fever, acute kidney injury, suspected infection and sepsis physiology, hyperkalemia, in the setting of advanced age.  I certify that at the point of admission it is my clinical judgment that the patient will require inpatient hospital care spanning beyond 2 midnights from the point of admission and that early discharge would result in unnecessary risk of decompensation and readmission or threat to life, limb or bodily function.

## 2016-11-25 NOTE — ED Provider Notes (Signed)
[facility] DEPT Provider Note    By signing my name below, I, Bea Graff, attest that this documentation has been prepared under the direction and in the presence of Sherwood Gambler, MD. Electronically Signed: Bea Graff, ED Scribe. 11/25/16. 3:48 AM.    History   Chief Complaint Chief Complaint  Patient presents with  . Emesis   The history is provided by the patient and medical records. No language interpreter was used.    [name] [name] is a 83 y.o. female with PMHx of CKD, HLD, T2DM, HTN, hypothyroidism, who presents to the Emergency Department complaining of nausea and vomiting that began about 6.5 hours ago. She reports associated abdominal cramping. She also reports some intermittent dysuria (last episode this morning) but states this is common and has had multiple UTIs in the past. She was outside doing yard work when the symptoms began. She has not taken anything for pain but received Zofran in triage with some relief. There are no modifying factors noted. She denies SOB, CP, diarrhea, dizziness, fever, chills. She denies any previous abdominal surgeries. Her last bowel movement was this morning and was normal.   Past Medical History:  Diagnosis Date  . Arthritis    "knees, legs" (06/10/2016)  . Chronic kidney disease    "related to my diabetes"  . Chronic lower back pain   . Diabetes mellitus without complication ([place])   . GERD (gastroesophageal reflux disease)   . High cholesterol   . Hyperlipidemia   . Hypertension   . Hypothyroidism   . OAB (overactive bladder)   . Osteoporosis   . Thyroid disease   . Type II diabetes mellitus [facility])     Patient Active Problem List   Diagnosis Date Noted  . Age-related osteoporosis without current pathological fracture 10/01/2016  . Hypertension 06/10/2016  . Hyperlipidemia 06/10/2016  . GERD (gastroesophageal reflux disease) 06/10/2016  . Insomnia 06/10/2016  . Confusion 06/10/2016  . Severe dehydration  06/10/2016  . Diabetes ([place]) 06/10/2016  . Lactic acidosis   . Diabetes mellitus with complication ([place])   . Viral gastroenteritis   . Right hip pain 05/06/2015  . Vaginal atrophy 11/01/2013  . ECZEMA 07/24/2008  . INTERMITTENT VERTIGO 07/24/2008  . CONSTIPATION 11/16/2007  . INSOMNIA 08/12/2007  . Essential hypertension 02/14/2007  . ALLERGIC RHINITIS 02/14/2007  . HYPOTHYROIDISM 01/26/2007  . Diabetes type 2, controlled ([place]) 01/26/2007  . HYPERLIPIDEMIA 01/26/2007  . GERD 01/26/2007  . DEGENERATIVE JOINT DISEASE 01/26/2007    Past Surgical History:  Procedure Laterality Date  . ABDOMINAL HYSTERECTOMY    . KNEE ARTHROSCOPY    . vocal cord polyps      OB History    Gravida Para Term Preterm AB Living   5 5 0 0 0     SAB TAB Ectopic Multiple Live Births   0 0 0           Home Medications    Prior to Admission medications   Medication Sig Start Date End Date Taking? Authorizing Provider  acetaminophen (TYLENOL) 500 MG tablet Take 1,000 mg by mouth every 6 (six) hours as needed (pain).    [provider]  alendronate (FOSAMAX) 70 MG tablet Take 70 mg by mouth every 7 (seven) days.  03/11/13   [provider]  alendronate (FOSAMAX) 70 MG tablet Take 70 mg by mouth every Saturday. Take with a full glass of water on an empty stomach.    [provider]  aspirin EC 81 MG tablet  Take 81 mg by mouth daily.    [provider]  aspirin EC 81 MG tablet Take 81 mg by mouth daily.    [provider]  Calcium Carbonate-Vitamin D (CALCIUM-D PO) Take 2 tablets by mouth 2 (two) times daily.    [provider]  Cholecalciferol (VITAMIN D PO) Take 1 tablet by mouth daily.    [provider]  cholecalciferol (VITAMIN D) 1000 UNITS tablet Take 1,000 Units by mouth daily.    [provider]  clindamycin (CLEOCIN) 2 % vaginal cream Place 1 Applicatorful vaginally at bedtime. 10/01/16   Terrance Mass, MD    cyclobenzaprine (FLEXERIL) 5 MG tablet Take 5 mg by mouth 3 (three) times daily as needed for muscle spasms.    [provider]  furosemide (LASIX) 20 MG tablet Take 20 mg by mouth every other day. Monday, Wednesday, and Friday 10/26/14   [provider]  furosemide (LASIX) 20 MG tablet Take 20 mg by mouth every Monday, Wednesday, and Friday.    [provider]  insulin aspart (NOVOLOG) 100 UNIT/ML injection Inject 6 Units into the skin 3 (three) times daily before meals. 05/06/15   Funches, Adriana Mccallum, MD  insulin aspart (NOVOLOG) 100 UNIT/ML injection Inject 7-8 Units into the skin See admin instructions. Inject 7 units subcutaneously at breakfast and lunch and 8 units at supper    [provider]  Insulin Detemir (LEVEMIR FLEXTOUCH) 100 UNIT/ML Pen Inject 8 Units into the skin 2 (two) times daily. 05/06/15   Funches, Adriana Mccallum, MD  Insulin Detemir (LEVEMIR FLEXTOUCH) 100 UNIT/ML Pen Inject 6-7 Units into the skin See admin instructions. Inject 7 units subcutaneously every morning and 6 units at night    [provider]  iron polysaccharides (NIFEREX) 150 MG capsule Take 150 mg by mouth 2 (two) times daily.     [provider]  iron polysaccharides (NIFEREX) 150 MG capsule Take 150 mg by mouth 2 (two) times daily.    [provider]  levothyroxine (SYNTHROID, LEVOTHROID) 100 MCG tablet Take 1 tablet (100 mcg total) by mouth daily. 09/25/14   Lorayne Marek, MD  levothyroxine (SYNTHROID, LEVOTHROID) 100 MCG tablet Take 100 mcg by mouth daily before breakfast.    [provider]  loratadine (CLARITIN) 10 MG tablet Take 10 mg by mouth every other day.     [provider]  loratadine (CLARITIN) 10 MG tablet Take 10 mg by mouth daily as needed for allergies.    [provider]  losartan (COZAAR) 25 MG tablet Take 1 tablet (25 mg total) by mouth daily. 09/25/14   Lorayne Marek, MD  losartan (COZAAR) 25 MG tablet Take 25 mg by  mouth daily.    [provider]  meclizine (ANTIVERT) 12.5 MG tablet Take 1 tablet (12.5 mg total) by mouth 3 (three) times daily as needed for dizziness. 11/13/14   Lacretia Leigh, MD  metFORMIN (GLUCOPHAGE) 1000 MG tablet Take 1,000 mg by mouth 2 (two) times daily with a meal.    [provider]  metFORMIN (GLUCOPHAGE) 1000 MG tablet Take 1,000 mg by mouth 2 (two) times daily with a meal.    [provider]  metroNIDAZOLE (FLAGYL) 500 MG tablet Take 1 tablet (500 mg total) by mouth 2 (two) times daily. 03/26/16   Terrance Mass, MD  mometasone (NASONEX) 50 MCG/ACT nasal spray Place 2 sprays into the nose 2 (two) times daily as needed (seasonal allergies).    [provider]  naproxen  sodium (ALEVE) 220 MG tablet Take 1 tablet (220 mg total) by mouth 2 (two) times daily with a meal. Patient taking differently: Take 440 mg by mouth 2 (two) times daily as needed (pain).  05/06/15   Boykin Nearing, MD  NONFORMULARY OR COMPOUNDED ITEM Estradiol 0.02 % 70ml prefilled applicator Sig: apply twice a week 01/20/16   Terrance Mass, MD  nystatin (MYCOSTATIN) 100000 UNIT/ML suspension Take 5 mLs by mouth 2 (two) times daily. For mouth 10/12/14   [provider]  Omega-3 Fatty Acids (FISH OIL PO) Take 1 capsule by mouth 2 (two) times daily.    [provider]  ondansetron (ZOFRAN ODT) 4 MG disintegrating tablet Take 1 tablet (4 mg total) by mouth every 8 (eight) hours as needed for nausea or vomiting. 11/25/16   Sherwood Gambler, MD  pantoprazole (PROTONIX) 40 MG tablet Take 1 tablet (40 mg total) by mouth daily. 09/25/14   Lorayne Marek, MD  pantoprazole (PROTONIX) 40 MG tablet Take 40 mg by mouth daily.    [provider]  polyethylene glycol (MIRALAX / GLYCOLAX) packet Take 17 g by mouth every Monday, Wednesday, and Friday. Mix in 8 oz liquid and drink    [provider]  simvastatin (ZOCOR) 40 MG tablet TAKE 1 TABLET BY MOUTH EVERY  EVENING. NEED OFFICE VISIT FOR MORE REFILLS 08/12/15   Boykin Nearing, MD  simvastatin (ZOCOR) 40 MG tablet Take 40 mg by mouth every evening.    [provider]    Family History Family History  Problem Relation Age of Onset  . Diabetes Mother   . Hypertension Mother   . Diabetes Sister   . Diabetes Brother     Social History Social History  Substance Use Topics  . Smoking status: Never Smoker  . Smokeless tobacco: Former Systems developer    Types: Chew    Quit date: 84  . Alcohol use No     Allergies   Lisinopril; Sulfa antibiotics; Sulfonamide derivatives; and Bactrim [sulfamethoxazole-trimethoprim]   Review of Systems Review of Systems  Constitutional: Negative for chills and fever.  Respiratory: Negative for shortness of breath.   Cardiovascular: Negative for chest pain.  Gastrointestinal: Positive for abdominal pain, nausea and vomiting. Negative for diarrhea.  Neurological: Negative for dizziness.  All other systems reviewed and are negative.    Physical Exam Updated Vital Signs BP (!) 170/67   Pulse 71   Temp 98.2 F (36.8 C)   Resp 16   SpO2 100%   Physical Exam  Constitutional: She is oriented to person, place, and time. She appears well-developed and well-nourished.  HENT:  Head: Normocephalic and atraumatic.  Right Ear: External ear normal.  Left Ear: External ear normal.  Nose: Nose normal.  Eyes: Right eye exhibits no discharge. Left eye exhibits no discharge.  Cardiovascular: Normal rate, regular rhythm and normal heart sounds.   Pulmonary/Chest: Effort normal and breath sounds normal.  Abdominal: Soft. There is tenderness (diffuse tenderness, worse in lower abdomen). There is no CVA tenderness.  Neurological: She is alert and oriented to person, place, and time.  Skin: Skin is warm and dry.  Nursing note and vitals reviewed.    ED Treatments / Results  DIAGNOSTIC STUDIES: Oxygen Saturation is 100% on RA, normal by my interpretation.     COORDINATION OF CARE: 12:29 AM- Will order urinalysis. Will order pain and nausea medication and CT abdomen. Pt verbalizes understanding and agrees to plan.  Medications  ondansetron (ZOFRAN-ODT) disintegrating tablet 4 mg (4  mg Oral Given 11/24/16 2153)  sodium chloride 0.9 % bolus 1,000 mL (0 mLs Intravenous Stopped 11/25/16 0501)  fentaNYL (SUBLIMAZE) injection 50 mcg (50 mcg Intravenous Given 11/25/16 0245)  ondansetron (ZOFRAN) injection 4 mg (4 mg Intravenous Given 11/25/16 0244)  iopamidol (ISOVUE-300) 61 % injection 100 mL (100 mLs Intravenous Contrast Given 11/25/16 0325)  ondansetron (ZOFRAN) injection 4 mg (4 mg Intravenous Given 11/25/16 0511)   Labs (all labs ordered are listed, but only abnormal results are displayed) Labs Reviewed  LIPASE, BLOOD - Abnormal; Notable for the following:       Result Value   Lipase 77 (*)    All other components within normal limits  COMPREHENSIVE METABOLIC PANEL - Abnormal; Notable for the following:    Sodium 130 (*)    Chloride 92 (*)    Glucose, Bld 381 (*)    BUN 33 (*)    Creatinine, Ser 1.14 (*)    GFR calc non Af Amer 44 (*)    GFR calc Af Amer 51 (*)    All other components within normal limits  CBC - Abnormal; Notable for the following:    RBC 3.36 (*)    Hemoglobin 10.1 (*)    HCT 30.8 (*)    All other components within normal limits  URINALYSIS, ROUTINE W REFLEX MICROSCOPIC - Abnormal; Notable for the following:    Color, Urine STRAW (*)    Glucose, UA >=500 (*)    Ketones, ur 20 (*)    Squamous Epithelial / LPF 0-5 (*)    All other components within normal limits  URINE CULTURE    EKG  EKG Interpretation  Date/Time:  Wednesday Nov 25 2016 02:28:04 EDT Ventricular Rate:  76 PR Interval:    QRS Duration: 84 QT Interval:  400 QTC Calculation: 450 R Axis:   60 Text Interpretation:  Sinus rhythm Prolonged PR interval RSR' in V1 or V2, probably normal variant no significant change since July 2017 Confirmed by Sherwood Gambler [phone]) on 11/25/2016 3:32:07 AM       Radiology Ct Abdomen Pelvis W Contrast  Result Date: 11/25/2016 CLINICAL DATA:  Abdominal pain, cramping, nausea, and vomiting for several hours. Elevated blood sugar. Elevated lipase. History of hypertension and diabetes. EXAM: CT ABDOMEN AND PELVIS WITH CONTRAST TECHNIQUE: Multidetector CT imaging of the abdomen and pelvis was performed using the standard protocol following bolus administration of intravenous contrast. CONTRAST:  100 mL Isovue-300 COMPARISON:  12/04/2005 FINDINGS: Lower chest: Mild dependent changes in the lung bases. Distal esophageal wall thickening may indicate reflux disease. Hepatobiliary: There is a calcification in the porta hepatis which could represent a cystic duct stone. However, there is no evidence of gallbladder distention or inflammatory change. No bile duct dilatation. No focal liver lesions. Pancreas: Unremarkable. No pancreatic ductal dilatation or surrounding inflammatory changes. Spleen: Normal in size without focal abnormality. Adrenals/Urinary Tract: No adrenal gland nodules. Small cyst on the left kidney. No hydronephrosis or hydroureter. Renal nephrograms are symmetrical. Bladder is unremarkable. Stomach/Bowel: The stomach is decompressed. Gastric wall appears somewhat thickened but this may just be due to underdistention. Gastritis is not excluded. Small bowel are decompressed. Diffusely stool-filled colon without abnormal distention or wall thickening. Appendix is normal. Vascular/Lymphatic: Aortic atherosclerosis. No enlarged abdominal or pelvic lymph nodes. Reproductive: Status post hysterectomy. No adnexal masses. Other: Diffuse free fluid throughout the abdomen and pelvis suggesting ascites. No free air. Abdominal wall musculature appears intact. Musculoskeletal: Degenerative changes in the spine. No destructive bone lesions. IMPRESSION:  Moderate diffuse abdominal and pelvic ascites. Stomach is decompressed. Possible  gastric wall thickening although this could be due to under distention. Can't exclude gastritis. No evidence of bowel obstruction. Electronically Signed   By: Lucienne Capers M.D.   On: 11/25/2016 03:48    Procedures Procedures (including critical care time)  Medications Ordered in ED Medications  ondansetron (ZOFRAN-ODT) disintegrating tablet 4 mg (4 mg Oral Given 11/24/16 2153)  sodium chloride 0.9 % bolus 1,000 mL (0 mLs Intravenous Stopped 11/25/16 0501)  fentaNYL (SUBLIMAZE) injection 50 mcg (50 mcg Intravenous Given 11/25/16 0245)  ondansetron (ZOFRAN) injection 4 mg (4 mg Intravenous Given 11/25/16 0244)  iopamidol (ISOVUE-300) 61 % injection 100 mL (100 mLs Intravenous Contrast Given 11/25/16 0325)  ondansetron (ZOFRAN) injection 4 mg (4 mg Intravenous Given 11/25/16 0511)     Initial Impression / Assessment and Plan / ED Course  I have reviewed the triage vital signs and the nursing notes.  Pertinent labs & imaging results that were available during my care of the patient were reviewed by me and considered in my medical decision making (see chart for details).     Patient is feeling better after Zofran. She is tolerating oral fluids. However she was also noted to be incontinent in the bed and was found to be retaining urine with over 350 mL of urine in her bladder after going to the bathroom. She currently has no back pain. Urinalysis is not consistent with UTI. Foley catheter placed, unclear why she is having retention. Discussed with GI, Dr. Ardis Hughs, given no clear etiology of her ascites. At this point he advises no further workup except as an outpatient with her PCP. No clear findings, patient can be referred to him after that. She has no known history of cirrhosis and her CT shows no obvious liver issues. She has not had a significant alcohol drinking history. At this point her vomiting abdominal pain are most likely from a gastroenteritis. Will discharge with Zofran. Discussed return  precautions. No chest pain or shortness of breath.  Final Clinical Impressions(s) / ED Diagnoses   Final diagnoses:  Upper abdominal pain  Nausea and vomiting in adult  Other ascites  Acute urinary retention    New Prescriptions New Prescriptions   ONDANSETRON (ZOFRAN ODT) 4 MG DISINTEGRATING TABLET    Take 1 tablet (4 mg total) by mouth every 8 (eight) hours as needed for nausea or vomiting.    I personally performed the services described in this documentation, which was scribed in my presence. The recorded information has been reviewed and is accurate.      Sherwood Gambler, MD 11/25/16 [phone]

## 2016-11-25 NOTE — Progress Notes (Signed)
Pharmacy Antibiotic Note  [name] [name] is a 83 y.o. female admitted on 11/25/2016 with AMS and hematuria. Seen in ED yesterday for abdominal pain; found to have urinary retention and abdominal ascites and Foley placed at that time. Returns today with worsening confusion Pharmacy has been consulted for Vancomycin and Zosyn dosing for sepsis.  Plan:  Vancomycin 1000 mg IV now, then 750 mg IV q24 hr; goal trough 15-20 mcg/mL  Measure vancomycin trough levels at steady state as indicated  Zosyn 3.375 g IV given once over 30 minutes, then every 8 hrs by 4-hr infusion  Daily creatinine while on vancomycin and Zosyn. If indications for anaerobic coverage can be ruled out (aspiration, abdominal infxn), suggest switching Zosyn to Cefepime in this elderly patient with multiple risk factors for kidney injury.  Height: 5\' 9"  (175.3 cm) Weight: 140 lb (63.5 kg) IBW/kg (Calculated) : 66.2  Temp (24hrs), Avg:98.8 F (37.1 C), Min:98.1 F (36.7 C), Max:100.2 F (37.9 C)   Recent Labs Lab 11/24/16 2159 11/25/16 1506 11/25/16 1708  WBC 7.2 12.0*  --   CREATININE 1.14* 1.43*  --   LATICACIDVEN  --   --  3.39*    Estimated Creatinine Clearance: 31.5 mL/min (A) (by C-G formula based on SCr of 1.43 mg/dL (H)).    Allergies  Allergen Reactions  . Lisinopril Cough    Reported by [facility]  . Sulfa Antibiotics Hives  . Sulfonamide Derivatives Hives  . Bactrim [Sulfamethoxazole-Trimethoprim] Rash    Reported by [facility]    Thank you for allowing pharmacy to be a part of this patient's care.  Reuel Boom, PharmD, BCPS Pager: [phone] 11/25/2016, 6:30 PM

## 2016-11-25 NOTE — ED Notes (Signed)
Did not irrigate bladder.  Bladder scan 27cc urine average.  Foley draining with no clots.

## 2016-11-25 NOTE — ED Notes (Signed)
Bed: XM58 Expected date:  Expected time:  Means of arrival:  Comments: EMS-altered-urinary retention

## 2016-11-25 NOTE — ED Provider Notes (Signed)
[facility] DEPT Provider Note   CSN: [id number] Arrival date & time: 11/25/16  1458     History   Chief Complaint Chief Complaint  Patient presents with  . Altered Mental Status  . Hematuria    HPI [name] [name] is a 83 y.o. female.  She is here today for evaluation of confusion, which started yesterday after leaving the emergency department.  The confusion has continued overnight, and she is not able to walk.  She was evaluated for abdominal pain yesterday and found to be in urinary retention, and a Foley catheter was placed.  She was referred to GI, for ascites, and PCP for ongoing management.  Her family member with her today states that prior to being seen yesterday in the emergency department, she was her normal self alert, and walking easily.  Level 5 caveat-altered mental state.  HPI  Past Medical History:  Diagnosis Date  . Arthritis    "knees, legs" (06/10/2016)  . Ascites   . Chronic kidney disease    "related to my diabetes"  . Chronic lower back pain   . Diabetes mellitus without complication ([place])   . GERD (gastroesophageal reflux disease)   . High cholesterol   . Hyperlipidemia   . Hypertension   . Hypothyroidism   . OAB (overactive bladder)   . Osteoporosis   . Thyroid disease   . Type II diabetes mellitus [facility])     Patient Active Problem List   Diagnosis Date Noted  . Age-related osteoporosis without current pathological fracture 10/01/2016  . Hypertension 06/10/2016  . Hyperlipidemia 06/10/2016  . GERD (gastroesophageal reflux disease) 06/10/2016  . Insomnia 06/10/2016  . Confusion 06/10/2016  . Severe dehydration 06/10/2016  . Diabetes ([place]) 06/10/2016  . Lactic acidosis   . Diabetes mellitus with complication ([place])   . Viral gastroenteritis   . Right hip pain 05/06/2015  . Vaginal atrophy 11/01/2013  . ECZEMA 07/24/2008  . INTERMITTENT VERTIGO 07/24/2008  . CONSTIPATION 11/16/2007  . INSOMNIA 08/12/2007  . Essential hypertension  02/14/2007  . ALLERGIC RHINITIS 02/14/2007  . HYPOTHYROIDISM 01/26/2007  . Diabetes type 2, controlled ([place]) 01/26/2007  . HYPERLIPIDEMIA 01/26/2007  . GERD 01/26/2007  . DEGENERATIVE JOINT DISEASE 01/26/2007    Past Surgical History:  Procedure Laterality Date  . ABDOMINAL HYSTERECTOMY    . KNEE ARTHROSCOPY    . vocal cord polyps      OB History    Gravida Para Term Preterm AB Living   5 5 0 0 0     SAB TAB Ectopic Multiple Live Births   0 0 0           Home Medications    Prior to Admission medications   Medication Sig Start Date End Date Taking? Authorizing Provider  acetaminophen (TYLENOL) 500 MG tablet Take 1,000 mg by mouth every 6 (six) hours as needed (pain).   Yes [provider]  alendronate (FOSAMAX) 70 MG tablet Take 70 mg by mouth every 7 (seven) days.  03/11/13  Yes [provider]  aspirin EC 81 MG tablet Take 81 mg by mouth daily.   Yes [provider]  Calcium Carbonate-Vitamin D (CALCIUM-D PO) Take 2 tablets by mouth 2 (two) times daily.   Yes [provider]  cholecalciferol (VITAMIN D) 1000 UNITS tablet Take 1,000 Units by mouth daily.   Yes [provider]  clindamycin (CLEOCIN) 2 % vaginal cream Place 1 Applicatorful vaginally at bedtime. 10/01/16  Yes Terrance Mass, MD  cyclobenzaprine (FLEXERIL) 5 MG tablet Take 5 mg by mouth 3 (three) times daily as needed for muscle spasms.   Yes [provider]  furosemide (LASIX) 20 MG tablet Take 20 mg by mouth every other day. Monday, Wednesday, and Friday 10/26/14  Yes [provider]  insulin aspart (NOVOLOG) 100 UNIT/ML injection Inject 6 Units into the skin 3 (three) times daily before meals. 05/06/15  Yes Funches, Josalyn, MD  insulin aspart (NOVOLOG) 100 UNIT/ML injection Inject 7-8 Units into the skin See admin instructions. Inject 7 units subcutaneously at breakfast and lunch and 8 units at supper   Yes [provider]  Insulin Detemir  (LEVEMIR FLEXTOUCH) 100 UNIT/ML Pen Inject 8 Units into the skin 2 (two) times daily. 05/06/15  Yes Funches, Josalyn, MD  Insulin Detemir (LEVEMIR FLEXTOUCH) 100 UNIT/ML Pen Inject 6-7 Units into the skin See admin instructions. Inject 7 units subcutaneously every morning and 6 units at night   Yes [provider]  iron polysaccharides (NIFEREX) 150 MG capsule Take 150 mg by mouth 2 (two) times daily.    Yes [provider]  levothyroxine (SYNTHROID, LEVOTHROID) 100 MCG tablet Take 1 tablet (100 mcg total) by mouth daily. 09/25/14  Yes Advani, Vernon Prey, MD  levothyroxine (SYNTHROID, LEVOTHROID) 100 MCG tablet Take 100 mcg by mouth daily before breakfast.   Yes [provider]  loratadine (CLARITIN) 10 MG tablet Take 10 mg by mouth every other day.    Yes [provider]  losartan (COZAAR) 25 MG tablet Take 1 tablet (25 mg total) by mouth daily. 09/25/14  Yes Advani, Vernon Prey, MD  losartan (COZAAR) 25 MG tablet Take 25 mg by mouth daily.   Yes [provider]  meclizine (ANTIVERT) 12.5 MG tablet Take 1 tablet (12.5 mg total) by mouth 3 (three) times daily as needed for dizziness. 11/13/14  Yes Lacretia Leigh, MD  metFORMIN (GLUCOPHAGE) 1000 MG tablet Take 1,000 mg by mouth 2 (two) times daily with a meal.   Yes [provider]  mometasone (NASONEX) 50 MCG/ACT nasal spray Place 2 sprays into the nose 2 (two) times daily as needed (seasonal allergies).   Yes [provider]  naproxen sodium (ALEVE) 220 MG tablet Take 1 tablet (220 mg total) by mouth 2 (two) times daily with a meal. Patient taking differently: Take 440 mg by mouth 2 (two) times daily as needed (pain).  05/06/15  Yes Funches, Adriana Mccallum, MD  NONFORMULARY OR COMPOUNDED ITEM Estradiol 0.02 % 23ml prefilled applicator Sig: apply twice a week 01/20/16  Yes Terrance Mass, MD  nystatin (MYCOSTATIN) 100000 UNIT/ML suspension Take 5 mLs by mouth 2 (two) times daily. For mouth 10/12/14  Yes  [provider]  Omega-3 Fatty Acids (FISH OIL PO) Take 1 capsule by mouth 2 (two) times daily.   Yes [provider]  ondansetron (ZOFRAN ODT) 4 MG disintegrating tablet Take 1 tablet (4 mg total) by mouth every 8 (eight) hours as needed for nausea or vomiting. 11/25/16  Yes Sherwood Gambler, MD  pantoprazole (PROTONIX) 40 MG tablet Take 1 tablet (40 mg total) by mouth daily. 09/25/14  Yes Advani, Vernon Prey, MD  pantoprazole (PROTONIX) 40 MG tablet Take 40 mg by mouth daily.   Yes [provider]  polyethylene glycol (MIRALAX / GLYCOLAX) packet Take 17 g by mouth every Monday, Wednesday, and Friday. Mix in 8 oz liquid and drink   Yes [provider]  simvastatin (ZOCOR) 40 MG tablet TAKE 1 TABLET BY MOUTH EVERY EVENING.  NEED OFFICE VISIT FOR MORE REFILLS 08/12/15  Yes Funches, Josalyn, MD  simvastatin (ZOCOR) 40 MG tablet Take 40 mg by mouth every evening.   Yes [provider]  metroNIDAZOLE (FLAGYL) 500 MG tablet Take 1 tablet (500 mg total) by mouth 2 (two) times daily. Patient not taking: Reported on 11/25/2016 03/26/16   Terrance Mass, MD    Family History Family History  Problem Relation Age of Onset  . Diabetes Mother   . Hypertension Mother   . Diabetes Sister   . Diabetes Brother     Social History Social History  Substance Use Topics  . Smoking status: Never Smoker  . Smokeless tobacco: Former Systems developer    Types: Chew    Quit date: 64  . Alcohol use No     Allergies   Lisinopril; Sulfa antibiotics; Sulfonamide derivatives; and Bactrim [sulfamethoxazole-trimethoprim]   Review of Systems Review of Systems  Unable to perform ROS: Mental status change     Physical Exam Updated Vital Signs BP (!) 154/69   Pulse (!) 120   Temp 100.2 F (37.9 C) (Rectal)   Resp (!) 38   Ht 5\' 9"  (1.753 m)   Wt 140 lb (63.5 kg)   SpO2 93%   BMI 20.67 kg/m   Physical Exam  Constitutional: She appears well-developed. No distress.  Elderly,  frail  HENT:  Head: Normocephalic and atraumatic.  Eyes: Conjunctivae and EOM are normal. Pupils are equal, round, and reactive to light.  Neck: Normal range of motion and phonation normal. Neck supple.  Cardiovascular: Normal rate and regular rhythm.   Pulmonary/Chest: Effort normal and breath sounds normal. She exhibits no tenderness.  Abdominal: Soft. She exhibits no distension. There is no tenderness. There is no guarding.  Musculoskeletal: Normal range of motion.  Neurological: She is alert. She exhibits normal muscle tone.  No evident dysarthria.  Unable to assess for aphasia.  No visible nystagmus.  Skin: Skin is warm and dry.  Psychiatric:  Somewhat irritable.  She bats her hands at examiner during examination.  Eyes closed but open with verbal questions, and occasional clear words spoken but not in response to questions.    Nursing note and vitals reviewed.    ED Treatments / Results  Labs (all labs ordered are listed, but only abnormal results are displayed) Labs Reviewed  COMPREHENSIVE METABOLIC PANEL - Abnormal; Notable for the following:       Result Value   Sodium 129 (*)    Potassium 5.8 (*)    Chloride 92 (*)    CO2 17 (*)    Glucose, Bld 596 (*)    BUN 36 (*)    Creatinine, Ser 1.43 (*)    GFR calc non Af Amer 34 (*)    GFR calc Af Amer 39 (*)    Anion gap 20 (*)    All other components within normal limits  CBC - Abnormal; Notable for the following:    WBC 12.0 (*)    RBC 3.79 (*)    Hemoglobin 11.7 (*)    HCT 35.2 (*)    All other components within normal limits  URINALYSIS, ROUTINE W REFLEX MICROSCOPIC - Abnormal; Notable for the following:    Color, Urine RED (*)    APPearance HAZY (*)    Glucose, UA >=500 (*)    Hgb urine dipstick LARGE (*)    Ketones, ur 20 (*)    Protein, ur 100 (*)    All other components within normal  limits  CBG MONITORING, ED - Abnormal; Notable for the following:    Glucose-Capillary 505 (*)    All other components  within normal limits  I-STAT CG4 LACTIC ACID, ED - Abnormal; Notable for the following:    Lactic Acid, Venous 3.39 (*)    All other components within normal limits  I-STAT CG4 LACTIC ACID, ED - Abnormal; Notable for the following:    Lactic Acid, Venous 2.94 (*)    All other components within normal limits  CBG MONITORING, ED - Abnormal; Notable for the following:    Glucose-Capillary 534 (*)    All other components within normal limits  CBG MONITORING, ED - Abnormal; Notable for the following:    Glucose-Capillary 527 (*)    All other components within normal limits  URINE CULTURE  CULTURE, BLOOD (ROUTINE X 2)  CULTURE, BLOOD (ROUTINE X 2)  AMMONIA  ETHANOL  CREATININE, SERUM  I-STAT CG4 LACTIC ACID, ED  I-STAT CG4 LACTIC ACID, ED    EKG  EKG Interpretation  Date/Time:  Wednesday Nov 25 2016 16:48:59 EDT Ventricular Rate:  98 PR Interval:    QRS Duration: 76 QT Interval:  350 QTC Calculation: 447 R Axis:   78 Text Interpretation:  Sinus rhythm Left ventricular hypertrophy Anterolateral infarct, acute (LAD) Baseline wander in lead(s) V2 V3 V4 V5 V6 since last tracing no significant change Confirmed by Daleen Bo [phone]) on 11/25/2016 5:44:38 PM       Radiology Ct Head Wo Contrast  Result Date: 11/25/2016 CLINICAL DATA:  Altered mental status. EXAM: CT HEAD WITHOUT CONTRAST TECHNIQUE: Contiguous axial images were obtained from the base of the skull through the vertex without intravenous contrast. COMPARISON:  Head CT 11/03/2013 FINDINGS: Brain: No evidence of acute infarction, hemorrhage, hydrocephalus, extra-axial collection or mass lesion/mass effect. Stable degree of atrophy and chronic last. Vascular: Atherosclerosis of skullbase vasculature without hyperdense vessel or abnormal calcification. Skull: No fracture or focal lesion. Sinuses/Orbits: Minimal chronic opacification of lower left mastoid air cells. Right mastoid air cells are clear. Visualized orbits and  paranasal sinuses are unremarkable. Other: None. IMPRESSION: Unchanged atrophy and chronic small vessel ischemia. No acute intracranial abnormality. Electronically Signed   By: Jeb Levering M.D.   On: 11/25/2016 19:34   Ct Abdomen Pelvis W Contrast  Result Date: 11/25/2016 CLINICAL DATA:  Abdominal pain, cramping, nausea, and vomiting for several hours. Elevated blood sugar. Elevated lipase. History of hypertension and diabetes. EXAM: CT ABDOMEN AND PELVIS WITH CONTRAST TECHNIQUE: Multidetector CT imaging of the abdomen and pelvis was performed using the standard protocol following bolus administration of intravenous contrast. CONTRAST:  100 mL Isovue-300 COMPARISON:  12/04/2005 FINDINGS: Lower chest: Mild dependent changes in the lung bases. Distal esophageal wall thickening may indicate reflux disease. Hepatobiliary: There is a calcification in the porta hepatis which could represent a cystic duct stone. However, there is no evidence of gallbladder distention or inflammatory change. No bile duct dilatation. No focal liver lesions. Pancreas: Unremarkable. No pancreatic ductal dilatation or surrounding inflammatory changes. Spleen: Normal in size without focal abnormality. Adrenals/Urinary Tract: No adrenal gland nodules. Small cyst on the left kidney. No hydronephrosis or hydroureter. Renal nephrograms are symmetrical. Bladder is unremarkable. Stomach/Bowel: The stomach is decompressed. Gastric wall appears somewhat thickened but this may just be due to underdistention. Gastritis is not excluded. Small bowel are decompressed. Diffusely stool-filled colon without abnormal distention or wall thickening. Appendix is normal. Vascular/Lymphatic: Aortic atherosclerosis. No enlarged abdominal or pelvic lymph nodes. Reproductive: Status post hysterectomy. No  adnexal masses. Other: Diffuse free fluid throughout the abdomen and pelvis suggesting ascites. No free air. Abdominal wall musculature appears intact.  Musculoskeletal: Degenerative changes in the spine. No destructive bone lesions. IMPRESSION: Moderate diffuse abdominal and pelvic ascites. Stomach is decompressed. Possible gastric wall thickening although this could be due to under distention. Can't exclude gastritis. No evidence of bowel obstruction. Electronically Signed   By: Lucienne Capers M.D.   On: 11/25/2016 03:48   Dg Chest Port 1 View  Result Date: 11/25/2016 CLINICAL DATA:  Acute onset of altered mental status. Urinary retention. Initial encounter. EXAM: PORTABLE CHEST 1 VIEW COMPARISON:  Chest radiograph performed 02/12/2016 FINDINGS: The lungs are well-aerated and clear. There is no evidence of focal opacification, pleural effusion or pneumothorax. The cardiomediastinal silhouette is within normal limits. No acute osseous abnormalities are seen. Calcification overlying the right humeral head likely reflects calcific tendinitis. IMPRESSION: 1. No acute cardiopulmonary process seen. 2. Calcific tendinitis at the right rotator cuff. Electronically Signed   By: Garald Balding M.D.   On: 11/25/2016 18:42    Procedures Procedures (including critical care time)  Medications Ordered in ED Medications  vancomycin (VANCOCIN) IVPB 750 mg/150 ml premix (not administered)  piperacillin-tazobactam (ZOSYN) IVPB 3.375 g (not administered)  dextrose 5 %-0.45 % sodium chloride infusion (not administered)  insulin regular (NOVOLIN R,HUMULIN R) 100 Units in sodium chloride 0.9 % 100 mL (1 Units/mL) infusion (9.3 Units/hr Intravenous Rate/Dose Change 11/25/16 2211)  sodium chloride 0.9 % bolus 1,000 mL (0 mLs Intravenous Stopped 11/25/16 1926)    And  sodium chloride 0.9 % bolus 1,000 mL (0 mLs Intravenous Stopped 11/25/16 2030)  vancomycin (VANCOCIN) IVPB 1000 mg/200 mL premix (0 mg Intravenous Stopped 11/25/16 1926)  piperacillin-tazobactam (ZOSYN) IVPB 3.375 g (0 g Intravenous Stopped 11/25/16 1919)     Initial Impression / Assessment and Plan / ED Course   I have reviewed the triage vital signs and the nursing notes.  Pertinent labs & imaging results that were available during my care of the patient were reviewed by me and considered in my medical decision making (see chart for details).  Clinical Course as of Nov 26 2242  Wed Nov 25, 2016  1742 Elevated Lactic Acid, Venous: (!!) 3.39 [EW]  1742 Normal Ammonia: 14 [EW]  1742 Low Sodium: (!) 129 [EW]  1742 High Potassium: (!) 5.8 [EW]  1742 Low Chloride: (!) 92 [EW]  1742 Low CO2: (!) 17 [EW]  1742 High Glucose: (!!) 596 [EW]  1742 High BUN: (!) 36 [EW]  1742 High Creatinine: (!) 1.43 [EW]  1743 At this point working diagnosis is DKA, patient covered with antibiotics for occult infection.  We will give high-volume saline bolus and reassess.  [EW]  2011 High WBC: (!) 12.0 [EW]  2011 High Glucose-Capillary: (!!) 534 [EW]  2234 Slightly improved Glucose-Capillary: (!!) 527 [EW]  2234 Slightly improved Lactic Acid, Venous: (!!) 2.94 [EW]    Clinical Course User Index [EW] Daleen Bo, MD    Medications  vancomycin (VANCOCIN) IVPB 750 mg/150 ml premix (not administered)  piperacillin-tazobactam (ZOSYN) IVPB 3.375 g (not administered)  dextrose 5 %-0.45 % sodium chloride infusion (not administered)  insulin regular (NOVOLIN R,HUMULIN R) 100 Units in sodium chloride 0.9 % 100 mL (1 Units/mL) infusion (9.3 Units/hr Intravenous Rate/Dose Change 11/25/16 2211)  sodium chloride 0.9 % bolus 1,000 mL (0 mLs Intravenous Stopped 11/25/16 1926)    And  sodium chloride 0.9 % bolus 1,000 mL (0 mLs Intravenous Stopped 11/25/16 2030)  vancomycin (VANCOCIN) IVPB 1000 mg/200 mL premix (0 mg Intravenous Stopped 11/25/16 1926)  piperacillin-tazobactam (ZOSYN) IVPB 3.375 g (0 g Intravenous Stopped 11/25/16 1919)    Patient Vitals for the past 24 hrs:  BP Temp Temp src Pulse Resp SpO2 Height Weight  11/25/16 2200 (!) 154/69 - - (!) 120 (!) 38 93 % - -  11/25/16 2139 - - - (!) 115 (!) 26 98 % - -  11/25/16  2136 (!) 181/71 - - - (!) 21 - - -  11/25/16 2030 (!) 173/65 - - (!) 110 (!) 22 98 % - -  11/25/16 1935 (!) 180/70 - - - - - - -  11/25/16 1930 (!) 173/69 - - (!) 106 19 98 % - -  11/25/16 1834 (!) 186/71 - - - - - - -  11/25/16 1807 - 100.2 F (37.9 C) Rectal - - - - -  11/25/16 1757 - - - - - - 5\' 9"  (1.753 m) 140 lb (63.5 kg)  11/25/16 1649 (!) 163/101 - - - - - - -  11/25/16 1600 (!) 177/85 - - - (!) 25 - - -  11/25/16 1505 (!) 176/68 98.1 F (36.7 C) Oral 83 18 99 % - -  11/25/16 1504 - - - - - 100 % - -    10:34 PM Reevaluation with update and discussion. After initial assessment and treatment, an updated evaluation reveals patient sleeping at this time.  Repeat CBG, is slightly improved.Daleen Bo L   10:44 PM-Consult complete with hospitalist. Patient case explained and discussed.  He agrees to admit patient for further evaluation and treatment. Call ended at 12: 10  Final Clinical Impressions(s) / ED Diagnoses   Final diagnoses:  Delirium  Diabetic ketoacidosis without coma associated with type 1 diabetes mellitus ([place])    Nonspecific delirium, with DKA.  Neurologic exam is nonfocal, doubt CVA.  Patient's insulin dosage was lowered 3 days ago.  No overt signs for infection.  Patient will require admission for further evaluation and treatment.  Nursing Notes Reviewed/ Care Coordinated Applicable Imaging Reviewed Interpretation of Laboratory Data incorporated into ED treatment  Plan: Admit   New Prescriptions New Prescriptions   No medications on file     Daleen Bo, MD 11/25/16 2313

## 2016-11-25 NOTE — ED Notes (Signed)
Attempted to pull blood from both IV site, unsuccessful.  Will attempt blood draw.

## 2016-11-25 NOTE — ED Notes (Signed)
Nurse and I attempted to get blood from pt, however the attempts were unsuccessful.

## 2016-11-25 NOTE — ED Notes (Signed)
Foley Cath maintenance education provided to patient and daughter.  Leg bag with securing device placed prior to transport d/c

## 2016-11-25 NOTE — ED Notes (Signed)
Pt from home via EMS with complaints of AMS. Per EMS this began when she got home from her visit here yesterday for urinary retention and a urinary foley was placed. Per EMS, pt is alert and oriented x 3 (not situation), but at time of assessment pt will not answer questions. Pt is not answering questions, she opens her eyes and looks at this RN when addressed and then closes them. Per family at the scene, pt has hx of ascites.

## 2016-11-25 NOTE — ED Notes (Signed)
Notified EDP,Wentz,MD., pt. I-stat CG4 Lactic acid 2.94 and RN,Liza made aware.

## 2016-11-25 NOTE — ED Notes (Addendum)
1st culture to lab.  Unable to get second culture prior to antibiotics

## 2016-11-25 NOTE — ED Notes (Signed)
MD Eulis Foster aware of CBG value.

## 2016-11-25 NOTE — ED Notes (Signed)
Patient tolerating po fluids well at this time.

## 2016-11-26 DIAGNOSIS — G9341 Metabolic encephalopathy: Secondary | ICD-10-CM

## 2016-11-26 LAB — BASIC METABOLIC PANEL
ANION GAP: 14 (ref 5–15)
ANION GAP: 8 (ref 5–15)
ANION GAP: 6 (ref 5–15)
BUN: 40 mg/dL — AB (ref 6–20)
BUN: 42 mg/dL — ABNORMAL HIGH (ref 6–20)
BUN: 31 mg/dL — AB (ref 6–20)
CALCIUM: 8.7 mg/dL — AB (ref 8.9–10.3)
CHLORIDE: 111 mmol/L (ref 101–111)
CO2: 17 mmol/L — ABNORMAL LOW (ref 22–32)
CO2: 24 mmol/L (ref 22–32)
CO2: 24 mmol/L (ref 22–32)
Calcium: 9 mg/dL (ref 8.9–10.3)
Calcium: 8.4 mg/dL — ABNORMAL LOW (ref 8.9–10.3)
Chloride: 106 mmol/L (ref 101–111)
Chloride: 108 mmol/L (ref 101–111)
Creatinine, Ser: 1.57 mg/dL — ABNORMAL HIGH (ref 0.44–1.00)
Creatinine, Ser: 1.4 mg/dL — ABNORMAL HIGH (ref 0.44–1.00)
Creatinine, Ser: 1.26 mg/dL — ABNORMAL HIGH (ref 0.44–1.00)
GFR calc Af Amer: 45 mL/min — ABNORMAL LOW (ref >60)
GFR, EST AFRICAN AMERICAN: 35 mL/min — AB (ref >60)
GFR, EST AFRICAN AMERICAN: 40 mL/min — AB (ref >60)
GFR, EST NON AFRICAN AMERICAN: 30 mL/min — AB (ref >60)
GFR, EST NON AFRICAN AMERICAN: 34 mL/min — AB (ref >60)
GFR, EST NON AFRICAN AMERICAN: 39 mL/min — AB (ref >60)
GLUCOSE: 127 mg/dL — AB (ref 65–99)
Glucose, Bld: 398 mg/dL — ABNORMAL HIGH (ref 65–99)
Glucose, Bld: 138 mg/dL — ABNORMAL HIGH (ref 65–99)
POTASSIUM: 4.2 mmol/L (ref 3.5–5.1)
POTASSIUM: 3.6 mmol/L (ref 3.5–5.1)
Potassium: 3.8 mmol/L (ref 3.5–5.1)
SODIUM: 137 mmol/L (ref 135–145)
Sodium: 140 mmol/L (ref 135–145)
Sodium: 141 mmol/L (ref 135–145)

## 2016-11-26 LAB — GLUCOSE, CAPILLARY
GLUCOSE-CAPILLARY: 100 mg/dL — AB (ref 65–99)
GLUCOSE-CAPILLARY: 184 mg/dL — AB (ref 65–99)
GLUCOSE-CAPILLARY: 237 mg/dL — AB (ref 65–99)
GLUCOSE-CAPILLARY: 243 mg/dL — AB (ref 65–99)
GLUCOSE-CAPILLARY: 314 mg/dL — AB (ref 65–99)
Glucose-Capillary: 125 mg/dL — ABNORMAL HIGH (ref 65–99)
Glucose-Capillary: 125 mg/dL — ABNORMAL HIGH (ref 65–99)
Glucose-Capillary: 128 mg/dL — ABNORMAL HIGH (ref 65–99)
Glucose-Capillary: 138 mg/dL — ABNORMAL HIGH (ref 65–99)
Glucose-Capillary: 141 mg/dL — ABNORMAL HIGH (ref 65–99)
Glucose-Capillary: 147 mg/dL — ABNORMAL HIGH (ref 65–99)
Glucose-Capillary: 214 mg/dL — ABNORMAL HIGH (ref 65–99)
Glucose-Capillary: 411 mg/dL — ABNORMAL HIGH (ref 65–99)

## 2016-11-26 LAB — PHOSPHORUS: Phosphorus: 2.8 mg/dL (ref 2.5–4.6)

## 2016-11-26 LAB — COMPREHENSIVE METABOLIC PANEL
ALBUMIN: 4.2 g/dL (ref 3.5–5.0)
ALT: 29 U/L (ref 14–54)
ANION GAP: 20 — AB (ref 5–15)
AST: 27 U/L (ref 15–41)
Alkaline Phosphatase: 71 U/L (ref 38–126)
BUN: 36 mg/dL — ABNORMAL HIGH (ref 6–20)
CALCIUM: 9.7 mg/dL (ref 8.9–10.3)
CHLORIDE: 92 mmol/L — AB (ref 101–111)
CO2: 17 mmol/L — ABNORMAL LOW (ref 22–32)
Creatinine, Ser: 1.43 mg/dL — ABNORMAL HIGH (ref 0.44–1.00)
GFR calc non Af Amer: 34 mL/min — ABNORMAL LOW (ref >60)
GFR, EST AFRICAN AMERICAN: 39 mL/min — AB (ref >60)
Glucose, Bld: 596 mg/dL (ref 65–99)
POTASSIUM: 5.8 mmol/L — AB (ref 3.5–5.1)
SODIUM: 129 mmol/L — AB (ref 135–145)
Total Bilirubin: 1.1 mg/dL (ref 0.3–1.2)
Total Protein: 7.4 g/dL (ref 6.5–8.1)

## 2016-11-26 LAB — MRSA PCR SCREENING: MRSA BY PCR: NEGATIVE

## 2016-11-26 LAB — PROCALCITONIN: Procalcitonin: 2.29 ng/mL

## 2016-11-26 LAB — LIPASE, BLOOD: LIPASE: 22 U/L (ref 11–51)

## 2016-11-26 LAB — LACTIC ACID, PLASMA
LACTIC ACID, VENOUS: 3.4 mmol/L — AB (ref 0.5–1.9)
LACTIC ACID, VENOUS: 2 mmol/L — AB (ref 0.5–1.9)

## 2016-11-26 LAB — MAGNESIUM: Magnesium: 1.9 mg/dL (ref 1.7–2.4)

## 2016-11-26 LAB — URINE CULTURE

## 2016-11-26 LAB — TROPONIN I: TROPONIN I: 0.44 ng/mL — AB (ref <0.03)

## 2016-11-26 MED ORDER — DEXTROSE 5 % IV SOLN
1.0000 g | INTRAVENOUS | Status: DC
  Administered 2016-11-26 – 2016-11-30 (×5): 1 g via INTRAVENOUS
  Filled 2016-11-26 (×5): qty 10

## 2016-11-26 MED ORDER — SODIUM CHLORIDE 0.9 % IV SOLN: INTRAVENOUS | Status: DC

## 2016-11-26 MED ORDER — LEVOTHYROXINE SODIUM 100 MCG PO TABS
100.0000 ug | ORAL_TABLET | Freq: Every day | ORAL | Status: DC
  Administered 2016-11-26 – 2016-12-01 (×6): 100 ug via ORAL
  Filled 2016-11-26 (×6): qty 1

## 2016-11-26 MED ORDER — INSULIN ASPART 100 UNIT/ML ~~LOC~~ SOLN
0.0000 [IU] | SUBCUTANEOUS | Status: DC
  Administered 2016-11-26: 3 [IU] via SUBCUTANEOUS
  Administered 2016-11-26 (×2): 1 [IU] via SUBCUTANEOUS
  Administered 2016-11-27: 5 [IU] via SUBCUTANEOUS
  Administered 2016-11-27: 2 [IU] via SUBCUTANEOUS
  Administered 2016-11-27: 3 [IU] via SUBCUTANEOUS
  Administered 2016-11-27: 2 [IU] via SUBCUTANEOUS
  Administered 2016-11-28: 1 [IU] via SUBCUTANEOUS
  Administered 2016-11-28: 2 [IU] via SUBCUTANEOUS
  Administered 2016-11-29: 1 [IU] via SUBCUTANEOUS
  Administered 2016-11-29: 2 [IU] via SUBCUTANEOUS
  Administered 2016-11-29 – 2016-11-30 (×3): 1 [IU] via SUBCUTANEOUS
  Administered 2016-11-30 (×2): 2 [IU] via SUBCUTANEOUS
  Administered 2016-12-01: 1 [IU] via SUBCUTANEOUS
  Administered 2016-12-01: 0 [IU] via SUBCUTANEOUS
  Administered 2016-12-01 – 2016-12-02 (×5): 1 [IU] via SUBCUTANEOUS
  Administered 2016-12-02: 2 [IU] via SUBCUTANEOUS
  Administered 2016-12-02 – 2016-12-03 (×4): 1 [IU] via SUBCUTANEOUS

## 2016-11-26 MED ORDER — HYDRALAZINE HCL 20 MG/ML IJ SOLN
10.0000 mg | INTRAMUSCULAR | Status: DC | PRN
  Administered 2016-11-28: 10 mg via INTRAVENOUS
  Filled 2016-11-26: qty 1

## 2016-11-26 MED ORDER — ENOXAPARIN SODIUM 30 MG/0.3ML ~~LOC~~ SOLN
30.0000 mg | SUBCUTANEOUS | Status: DC
  Administered 2016-11-26: 30 mg via SUBCUTANEOUS
  Filled 2016-11-26: qty 0.3

## 2016-11-26 MED ORDER — SODIUM CHLORIDE 0.9 % IV BOLUS (SEPSIS)
1000.0000 mL | Freq: Once | INTRAVENOUS | Status: AC
  Administered 2016-11-26: 1000 mL via INTRAVENOUS

## 2016-11-26 MED ORDER — SIMVASTATIN 40 MG PO TABS
40.0000 mg | ORAL_TABLET | Freq: Every evening | ORAL | Status: DC
  Administered 2016-11-26: 40 mg via ORAL
  Filled 2016-11-26: qty 1

## 2016-11-26 MED ORDER — MORPHINE SULFATE (PF) 4 MG/ML IV SOLN
1.0000 mg | INTRAVENOUS | Status: DC | PRN
  Administered 2016-11-26 – 2016-11-30 (×3): 1 mg via INTRAVENOUS
  Filled 2016-11-26 (×3): qty 1

## 2016-11-26 MED ORDER — ACETAMINOPHEN 650 MG RE SUPP
650.0000 mg | Freq: Four times a day (QID) | RECTAL | Status: DC | PRN
Start: 2016-11-26 — End: 2016-12-03

## 2016-11-26 MED ORDER — ONDANSETRON HCL 4 MG/2ML IJ SOLN: 4.0000 mg | Freq: Four times a day (QID) | INTRAMUSCULAR | Status: DC | PRN

## 2016-11-26 MED ORDER — INSULIN DETEMIR 100 UNIT/ML ~~LOC~~ SOLN
8.0000 [IU] | Freq: Two times a day (BID) | SUBCUTANEOUS | Status: DC
  Administered 2016-11-26 – 2016-11-30 (×9): 8 [IU] via SUBCUTANEOUS
  Administered 2016-12-01: 4 [IU] via SUBCUTANEOUS
  Filled 2016-11-26 (×10): qty 0.08

## 2016-11-26 MED ORDER — ACETAMINOPHEN 325 MG PO TABS
650.0000 mg | ORAL_TABLET | Freq: Four times a day (QID) | ORAL | Status: DC | PRN
  Administered 2016-11-28: 650 mg via ORAL
  Filled 2016-11-26: qty 2

## 2016-11-26 MED ORDER — ONDANSETRON HCL 4 MG PO TABS
4.0000 mg | ORAL_TABLET | Freq: Four times a day (QID) | ORAL | Status: DC | PRN
  Administered 2016-11-30: 4 mg via ORAL
  Filled 2016-11-26: qty 1

## 2016-11-26 MED ORDER — DEXTROSE-NACL 5-0.45 % IV SOLN
INTRAVENOUS | Status: DC
  Administered 2016-11-26: 03:00:00 via INTRAVENOUS

## 2016-11-26 MED ORDER — INSULIN ASPART 100 UNIT/ML ~~LOC~~ SOLN: 0.0000 [IU] | SUBCUTANEOUS | Status: DC

## 2016-11-26 MED ORDER — SODIUM CHLORIDE 0.9 % IV SOLN
INTRAVENOUS | Status: DC
  Administered 2016-11-26 – 2016-11-28 (×4): via INTRAVENOUS

## 2016-11-26 MED ORDER — INSULIN DETEMIR 100 UNIT/ML ~~LOC~~ SOLN
5.0000 [IU] | Freq: Once | SUBCUTANEOUS | Status: AC
  Administered 2016-11-26: 5 [IU] via SUBCUTANEOUS
  Filled 2016-11-26: qty 0.05

## 2016-11-26 MED ORDER — PANTOPRAZOLE SODIUM 40 MG PO TBEC
40.0000 mg | DELAYED_RELEASE_TABLET | Freq: Every day | ORAL | Status: DC
  Administered 2016-11-26 – 2016-12-01 (×6): 40 mg via ORAL
  Filled 2016-11-26 (×6): qty 1

## 2016-11-26 NOTE — Care Management Note (Signed)
Case Management Note  Patient Details  Name: ALLIA WILTSEY MRN: [id number] Date of Birth: May 08, 1934  Subjective/Objective:      dka, hematuria, confusion              Action/Plan:Date:  Nov 25, 2016 Chart reviewed for concurrent status and case management needs. Will continue to follow patient progress. Discharge Planning: following for needs Expected discharge date: [id number] Velva Harman, BSN, [place], [place]   Expected Discharge Date:                  Expected Discharge Plan:  Home/Self Care  In-House Referral:     Discharge planning Services  CM Consult  Post Acute Care Choice:    Choice offered to:     DME Arranged:    DME Agency:     HH Arranged:    HH Agency:     Status of Service:  In process, will continue to follow  If discussed at Long Length of Stay Meetings, dates discussed:    Additional Comments:  Leeroy Cha, RN 11/26/2016, 11:24 AM

## 2016-11-26 NOTE — Progress Notes (Signed)
CRITICAL VALUE ALERT  Critical value received:  Troponin 0.44, lactic acid 3.4 Date of notification:  5/10  Time of notification:  0130  Critical value read back: yes  Nurse who received alert:  Everlean Alstrom, RN  MD notified (1st page):  Lamar Blinks, [facility] NP  Time of first page:  [phone] (delay due to system downtime)

## 2016-11-26 NOTE — Progress Notes (Signed)
PROGRESS NOTE    [name] [name]  BLT:[id number] DOB: 01/02/1934 DOA: 11/25/2016 PCP: Aura Dials, MD     Brief Narrative:  83 yo female who presented with the chief complain of confusion, malaise and abdominal pain. Patient known to have insulin dependent diabetes mellitus and hypothyroidism. 3 days before admission her insulin regimen was modified, decreased dose of insulin, 24 hours later patient became hyperglycemic, came to the ED, glucose was stabilized and foley catheter was placed due to urinary retention and discharged home. The day of admission patient developed worsening confusion, pulled foley catheter, producing hematuria. On the initial examination, temperature was 100.5 with HR 120 and respiratory rate 38. Patient confused unable to follow commands, her lung were clear to auscultation, heart s1-s2 present and soft abdomen. Serum glucose 596 with anion gap of 20. Patient was admitted with the working diagnosis of DKA complicated with sepsis.      Assessment & Plan:   Principal Problem:   DKA (diabetic ketoacidoses) ([place]) Active Problems:   Hypothyroidism, acquired   Type 2 diabetes mellitus without complication, with long-term current use of insulin ([facility])   Essential hypertension   Hyperkalemia   Sepsis ([place])   AKI (acute kidney injury) ([place])   Delirium   1. DKA. Patient has been transitioned to sq insulin, will continue basal insulin with levimir 8 units bid and insulin sliding scale for glucose cover and monitoring. Diet has been advanced with good toleration. Follow bmp this pm. Serum K at 3,8 with cr at 1,48. Continue sliding scale for glucose cover and monitoring.   2. Sepsis due to suspected urine infection, to rule out related for indwelling foley catheter, present on admission. Will continue antibiotic therapy with ceftriaxone, lactic acid trending down to 2,0 from peak 3,39. Follow up on blood cultures. Continue hydration with saline at 75 ml per hour, follow a  restrictive IV fluids strategy. Chest film personally reviewed noted rotation to the right, with right base atelectasis, with no other infiltrates.   3. AKI with hyperkalemia. Renal function with cr trending down to 1,26 from peak 1,57, will continue isotonic saline at 75 ml per hour, follow on renal panel this pm. Avoid hypotension or nephrotoxic medications.    4. Metabolic encephalopathy. Continue neuro checks per unit protocol. Aspiration precautions. Will continue supportive medical therapy with IV fluids and IV antibiotics.   5. Hypothyroidism. Continue levothyroxine per home regimen.   6. Dyslipidemia. Continue simvastatin.    DVT prophylaxis: enoxaparin  Code Status: Full  Family Communication:  Disposition Plan: Home    Consultants:     Procedures:    Antimicrobials:   Ceftriaxone.    Subjective: Patient very somnolent, not in pain, no dyspnea, nausea or vomiting. Had sq insulin this am. Patient's daughter at the bedside.   Objective: Vitals:   11/26/16 0300 11/26/16 0400 11/26/16 0500 11/26/16 0600  BP: (!) 137/59 (!) 143/48 (!) 128/47 (!) 137/47  Pulse: 89 85 81   Resp: 19     Temp: 99.6 F (37.6 C)     TempSrc: Axillary     SpO2: 99% 98% 97%   Weight:      Height:        Intake/Output Summary (Last 24 hours) at 11/26/16 0737 Last data filed at 11/26/16 0727  Gross per 24 hour  Intake          2610.88 ml  Output             1875 ml  Net  735.88 ml   Filed Weights   11/25/16 1757 11/26/16 0017  Weight: 63.5 kg (140 lb) 61.1 kg (134 lb 11.2 oz)    Examination:  General exam: Deconditioned and ill looking appearing E ENT: mild pallor, no icterus, oral mucosa dry.  Respiratory system: Clear to auscultation. Decreased inspiratory effort, no wheezing, rales or rhonchi.  Cardiovascular system: S1 & S2 heard, RRR. No JVD, murmurs, rubs, gallops or clicks. No pedal edema. Gastrointestinal system: Abdomen is nondistended, soft and nontender.  No organomegaly or masses felt. Normal bowel sounds heard. Central nervous system: Patient somnolent and disorientated, able to follow commands, non focal, strength preserved.  Extremities: Symmetric 5 x 5 power. Skin: No rashes, lesions or ulcers     Data Reviewed: I have personally reviewed following labs and imaging studies  CBC:  Recent Labs Lab 11/24/16 2159 11/25/16 1506  WBC 7.2 12.0*  HGB 10.1* 11.7*  HCT 30.8* 35.2*  MCV 91.7 92.9  PLT 289 460   Basic Metabolic Panel:  Recent Labs Lab 11/24/16 2159 11/25/16 1506 11/26/16 0106 11/26/16 0511  NA 130* 129* 137 140  K 4.4 5.8* 4.2 3.8  CL 92* 92* 106 108  CO2 26 17* 17* 24  GLUCOSE 381* 596* 398* 138*  BUN 33* 36* 40* 42*  CREATININE 1.14* 1.43* 1.57* 1.40*  CALCIUM 10.1 9.7 9.0 8.7*  MG  --   --  1.9  --   PHOS  --   --  2.8  --    GFR: Estimated Creatinine Clearance: 30.9 mL/min (A) (by C-G formula based on SCr of 1.4 mg/dL (H)). Liver Function Tests:  Recent Labs Lab 11/24/16 2159 11/25/16 1506  AST 28 27  ALT 29 29  ALKPHOS 62 71  BILITOT 0.5 1.1  PROT 7.5 7.4  ALBUMIN 4.4 4.2    Recent Labs Lab 11/24/16 2159 11/26/16 0511  LIPASE 77* 22    Recent Labs Lab 11/25/16 1608  AMMONIA 14   Coagulation Profile: No results for input(s): INR, PROTIME in the last 168 hours. Cardiac Enzymes:  Recent Labs Lab 11/26/16 0106  TROPONINI 0.44*   BNP (last 3 results) No results for input(s): PROBNP in the last 8760 hours. HbA1C: No results for input(s): HGBA1C in the last 72 hours. CBG:  Recent Labs Lab 11/26/16 0300 11/26/16 0403 11/26/16 0509 11/26/16 0613 11/26/16 0722  GLUCAP 243* 184* 125* 141* 138*   Lipid Profile: No results for input(s): CHOL, HDL, LDLCALC, TRIG, CHOLHDL, LDLDIRECT in the last 72 hours. Thyroid Function Tests: No results for input(s): TSH, T4TOTAL, FREET4, T3FREE, THYROIDAB in the last 72 hours. Anemia Panel: No results for input(s): VITAMINB12, FOLATE,  FERRITIN, TIBC, IRON, RETICCTPCT in the last 72 hours. Sepsis Labs:  Recent Labs Lab 11/25/16 1708 11/25/16 2143 11/26/16 0106 11/26/16 0511  PROCALCITON  --   --  2.29  --   LATICACIDVEN 3.39* 2.94* 3.4* 2.0*    Recent Results (from the past 240 hour(s))  MRSA PCR Screening     Status: None   Collection Time: 11/26/16 12:23 AM  Result Value Ref Range Status   MRSA by PCR NEGATIVE NEGATIVE Final    Comment:        The GeneXpert MRSA Assay (FDA approved for NASAL specimens only), is one component of a comprehensive MRSA colonization surveillance program. It is not intended to diagnose MRSA infection nor to guide or monitor treatment for MRSA infections.          Radiology Studies: Ct Head Wo  Contrast  Result Date: 11/25/2016 CLINICAL DATA:  Altered mental status. EXAM: CT HEAD WITHOUT CONTRAST TECHNIQUE: Contiguous axial images were obtained from the base of the skull through the vertex without intravenous contrast. COMPARISON:  Head CT 11/03/2013 FINDINGS: Brain: No evidence of acute infarction, hemorrhage, hydrocephalus, extra-axial collection or mass lesion/mass effect. Stable degree of atrophy and chronic last. Vascular: Atherosclerosis of skullbase vasculature without hyperdense vessel or abnormal calcification. Skull: No fracture or focal lesion. Sinuses/Orbits: Minimal chronic opacification of lower left mastoid air cells. Right mastoid air cells are clear. Visualized orbits and paranasal sinuses are unremarkable. Other: None. IMPRESSION: Unchanged atrophy and chronic small vessel ischemia. No acute intracranial abnormality. Electronically Signed   By: Jeb Levering M.D.   On: 11/25/2016 19:34   Ct Abdomen Pelvis W Contrast  Result Date: 11/25/2016 CLINICAL DATA:  Abdominal pain, cramping, nausea, and vomiting for several hours. Elevated blood sugar. Elevated lipase. History of hypertension and diabetes. EXAM: CT ABDOMEN AND PELVIS WITH CONTRAST TECHNIQUE:  Multidetector CT imaging of the abdomen and pelvis was performed using the standard protocol following bolus administration of intravenous contrast. CONTRAST:  100 mL Isovue-300 COMPARISON:  12/04/2005 FINDINGS: Lower chest: Mild dependent changes in the lung bases. Distal esophageal wall thickening may indicate reflux disease. Hepatobiliary: There is a calcification in the porta hepatis which could represent a cystic duct stone. However, there is no evidence of gallbladder distention or inflammatory change. No bile duct dilatation. No focal liver lesions. Pancreas: Unremarkable. No pancreatic ductal dilatation or surrounding inflammatory changes. Spleen: Normal in size without focal abnormality. Adrenals/Urinary Tract: No adrenal gland nodules. Small cyst on the left kidney. No hydronephrosis or hydroureter. Renal nephrograms are symmetrical. Bladder is unremarkable. Stomach/Bowel: The stomach is decompressed. Gastric wall appears somewhat thickened but this may just be due to underdistention. Gastritis is not excluded. Small bowel are decompressed. Diffusely stool-filled colon without abnormal distention or wall thickening. Appendix is normal. Vascular/Lymphatic: Aortic atherosclerosis. No enlarged abdominal or pelvic lymph nodes. Reproductive: Status post hysterectomy. No adnexal masses. Other: Diffuse free fluid throughout the abdomen and pelvis suggesting ascites. No free air. Abdominal wall musculature appears intact. Musculoskeletal: Degenerative changes in the spine. No destructive bone lesions. IMPRESSION: Moderate diffuse abdominal and pelvic ascites. Stomach is decompressed. Possible gastric wall thickening although this could be due to under distention. Can't exclude gastritis. No evidence of bowel obstruction. Electronically Signed   By: Lucienne Capers M.D.   On: 11/25/2016 03:48   Dg Chest Port 1 View  Result Date: 11/25/2016 CLINICAL DATA:  Acute onset of altered mental status. Urinary retention.  Initial encounter. EXAM: PORTABLE CHEST 1 VIEW COMPARISON:  Chest radiograph performed 02/12/2016 FINDINGS: The lungs are well-aerated and clear. There is no evidence of focal opacification, pleural effusion or pneumothorax. The cardiomediastinal silhouette is within normal limits. No acute osseous abnormalities are seen. Calcification overlying the right humeral head likely reflects calcific tendinitis. IMPRESSION: 1. No acute cardiopulmonary process seen. 2. Calcific tendinitis at the right rotator cuff. Electronically Signed   By: Garald Balding M.D.   On: 11/25/2016 18:42        Scheduled Meds: . enoxaparin (LOVENOX) injection  30 mg Subcutaneous Q24H  . levothyroxine  100 mcg Oral QAC breakfast  . pantoprazole  40 mg Oral Daily  . simvastatin  40 mg Oral QPM   Continuous Infusions: . sodium chloride Stopped (11/26/16 0300)  . dextrose 5 % and 0.45% NaCl 125 mL/hr at 11/26/16 0400  . insulin (NOVOLIN-R) infusion 2.3 Units/hr (11/26/16  4199)  . piperacillin-tazobactam (ZOSYN)  IV 3.375 g (11/26/16 0727)  . vancomycin       LOS: 1 day      Tawni Millers, MD [facility] Pager [phone]  If 7PM-7AM, please contact night-coverage [email or web address] Password [facility] 11/26/2016, 7:37 AM

## 2016-11-27 ENCOUNTER — Encounter (HOSPITAL_COMMUNITY): Payer: Self-pay | Admitting: Radiology

## 2016-11-27 ENCOUNTER — Inpatient Hospital Stay (HOSPITAL_COMMUNITY): Payer: Medicare HMO

## 2016-11-27 DIAGNOSIS — R140 Abdominal distension (gaseous): Secondary | ICD-10-CM

## 2016-11-27 DIAGNOSIS — K219 Gastro-esophageal reflux disease without esophagitis: Secondary | ICD-10-CM

## 2016-11-27 DIAGNOSIS — R188 Other ascites: Secondary | ICD-10-CM

## 2016-11-27 DIAGNOSIS — R29810 Facial weakness: Secondary | ICD-10-CM

## 2016-11-27 DIAGNOSIS — E784 Other hyperlipidemia: Secondary | ICD-10-CM

## 2016-11-27 DIAGNOSIS — R338 Other retention of urine: Secondary | ICD-10-CM

## 2016-11-27 LAB — BASIC METABOLIC PANEL
Anion gap: 6 (ref 5–15)
BUN: 20 mg/dL (ref 6–20)
CHLORIDE: 105 mmol/L (ref 101–111)
CO2: 24 mmol/L (ref 22–32)
CREATININE: 0.88 mg/dL (ref 0.44–1.00)
Calcium: 7.9 mg/dL — ABNORMAL LOW (ref 8.9–10.3)
Glucose, Bld: 195 mg/dL — ABNORMAL HIGH (ref 65–99)
Potassium: 3.7 mmol/L (ref 3.5–5.1)
SODIUM: 135 mmol/L (ref 135–145)

## 2016-11-27 LAB — CBC WITH DIFFERENTIAL/PLATELET
BASOS PCT: 0 %
Basophils Absolute: 0 10*3/uL (ref 0.0–0.1)
EOS ABS: 0 10*3/uL (ref 0.0–0.7)
EOS PCT: 0 %
HCT: 30.1 % — ABNORMAL LOW (ref 36.0–46.0)
HEMOGLOBIN: 10 g/dL — AB (ref 12.0–15.0)
Lymphocytes Relative: 9 %
Lymphs Abs: 0.8 10*3/uL (ref 0.7–4.0)
MCH: 30.1 pg (ref 26.0–34.0)
MCHC: 33.2 g/dL (ref 30.0–36.0)
MCV: 90.7 fL (ref 78.0–100.0)
MONOS PCT: 13 %
Monocytes Absolute: 1.1 10*3/uL — ABNORMAL HIGH (ref 0.1–1.0)
NEUTROS PCT: 78 %
Neutro Abs: 6.9 10*3/uL (ref 1.7–7.7)
PLATELETS: 253 10*3/uL (ref 150–400)
RBC: 3.32 MIL/uL — ABNORMAL LOW (ref 3.87–5.11)
RDW: 13.9 % (ref 11.5–15.5)
WBC: 8.8 10*3/uL (ref 4.0–10.5)

## 2016-11-27 LAB — URINE CULTURE: CULTURE: NO GROWTH

## 2016-11-27 LAB — PROCALCITONIN: Procalcitonin: 1.74 ng/mL

## 2016-11-27 LAB — GLUCOSE, CAPILLARY
GLUCOSE-CAPILLARY: 181 mg/dL — AB (ref 65–99)
GLUCOSE-CAPILLARY: 171 mg/dL — AB (ref 65–99)
GLUCOSE-CAPILLARY: 106 mg/dL — AB (ref 65–99)
GLUCOSE-CAPILLARY: 163 mg/dL — AB (ref 65–99)
Glucose-Capillary: 69 mg/dL (ref 65–99)

## 2016-11-27 LAB — LACTATE DEHYDROGENASE: LDH: 191 U/L (ref 98–192)

## 2016-11-27 LAB — TSH: TSH: 0.903 u[IU]/mL (ref 0.350–4.500)

## 2016-11-27 MED ORDER — ATORVASTATIN CALCIUM 20 MG PO TABS
20.0000 mg | ORAL_TABLET | Freq: Every day | ORAL | Status: DC
  Administered 2016-11-28 – 2016-11-30 (×3): 20 mg via ORAL
  Filled 2016-11-27: qty 2
  Filled 2016-11-27 (×2): qty 1

## 2016-11-27 MED ORDER — AMLODIPINE BESYLATE 5 MG PO TABS
5.0000 mg | ORAL_TABLET | Freq: Every day | ORAL | Status: DC
  Administered 2016-11-27 – 2016-12-01 (×5): 5 mg via ORAL
  Filled 2016-11-27 (×5): qty 1

## 2016-11-27 MED ORDER — ENOXAPARIN SODIUM 40 MG/0.4ML ~~LOC~~ SOLN
40.0000 mg | SUBCUTANEOUS | Status: DC
  Administered 2016-11-28 – 2016-12-03 (×6): 40 mg via SUBCUTANEOUS
  Filled 2016-11-27 (×7): qty 0.4

## 2016-11-27 NOTE — Progress Notes (Signed)
Inpatient Diabetes Program Recommendations  AACE/ADA: New Consensus Statement on Inpatient Glycemic Control (2015)  Target Ranges:  Prepandial:   less than 140 mg/dL      Peak postprandial:   less than 180 mg/dL (1-2 hours)      Critically ill patients:  140 - 180 mg/dL   Lab Results  Component Value Date   GLUCAP 69 11/27/2016   HGBA1C 8.2 (H) 06/10/2016    Review of Glycemic Control  Diabetes history: DM2 Outpatient Diabetes medications: Levemir 7 units in am and 6 units QHS, Novalog 6 units tidwc Current orders for Inpatient glycemic control: Levemir 8 units tidwc, Novolog 0-9 units Q4H  Pt lives alone and daughter checks on her. A little confused when asked about how much insulin she takes at home. States her blood sugars have been high lately. Has occasional lows and treats with juice. States "I can tell when my blood sugars are going low."  Inpatient Diabetes Program Recommendations:    Decrease Levemir to 7 units bid. When po intake increases, add Novolog 3 units tidwc.  Continue to follow while inpatient.   Thank you. Lorenda Peck, RD, LDN, CDE Inpatient Diabetes Coordinator [phone]

## 2016-11-27 NOTE — Progress Notes (Signed)
The order for simvastatin(Zocor) was changed to an equivalent dose of atorvastatin(Lipitor) due to the potential drug interaction with Amlodipine.  When taken in combination with medications that inhibit its metabolism, simvastatin can accumulate which increases the risk of liver toxicity, myopathy, or rhabdomyolysis.  Simvastatin dose should not exceed 10mg /day in patients taking verapamil, diltiazem, fibrates, or niacin >or= 1g/day.   Simvastatin dose should not exceed 20mg /day in patients taking amlodipine, ranolazine or amiodarone.   Please consider this potential interaction at discharge.  [name] [name] A 11/27/2016 8:11 AM

## 2016-11-27 NOTE — Progress Notes (Signed)
Was called about patient with hypotensive episode while sitting on side of bed with SBP in 80s and improved back to 140s while laying in bed. Patient also noted to have concern for possible left facial droop. Came and assessed the patient. Patient laying in bed did endorse some dizziness while sitting on the side of the bed. Gen.: NAD Respiratory: CTA B Cardiovascular: RRR Abdomen: Soft, nontender, nondistended, positive bowel sounds Extremities: No clubbing cyanosis or edema Neuro: Alert and oriented. CN 2 through 12 grossly intact. 5 out of 5 bilateral upper extremity strength. 5/5 bilateral lower extremity strength. Sensation intact.  Assessment/plan Dizziness/? Facial droop Secondary to hypotension. Continue IV fluids. Check orthostatics in the morning. CT head done was negative for any acute abnormalities. Monitor closely.  No charge.

## 2016-11-27 NOTE — Progress Notes (Signed)
Patient ID: [name] [name], female   DOB: 1933/12/25, 83 y.o.   MRN: [id number] Pt presented to [place] dept today for paracentesis. On limited US abd in all four quadrants there is only a small amount of ascites present and close proximity of bowel loops prevent safe access at this time. Procedure not performed. Pt informed. Can reassess ascites volume in few days if necessary.

## 2016-11-27 NOTE — Progress Notes (Signed)
Called to bedside by Nurse Tech to eval patient for Stroke. Patient supine in bed with daughter at bedside. Bedside RN at in room.  Unable to raise eyebrows, smile asymmetrical with left facial droop and slurred speech.Grips equal and foot pushes equal. Facial sensation equal to bilateral cheeks. Dr Grandville Silos called and updated on above information. Orders  Received. Daughter remains at bedside.

## 2016-11-27 NOTE — Progress Notes (Signed)
PROGRESS NOTE    [name] [name]  XAJ:[id number] DOB: Mar 10, 1934 DOA: 11/25/2016 PCP: Aura Dials, MD    Brief Narrative:  83 yo female who presented with the chief complain of confusion, malaise and abdominal pain. Patient known to have insulin dependent diabetes mellitus and hypothyroidism. 3 days before admission her insulin regimen was modified, decreased dose of insulin, 24 hours later patient became hyperglycemic, came to the ED, glucose was stabilized and foley catheter was placed due to urinary retention and discharged home. The day of admission patient developed worsening confusion, pulled foley catheter, producing hematuria. On the initial examination, temperature was 100.5 with HR 120 and respiratory rate 38. Patient confused unable to follow commands, her lung were clear to auscultation, heart s1-s2 present and soft abdomen. Serum glucose 596 with anion gap of 20. Patient was admitted with the working diagnosis of DKA complicated with sepsis.     Assessment & Plan:   Principal Problem:   DKA (diabetic ketoacidoses) ([place]) Active Problems:   Hypothyroidism, acquired   Type 2 diabetes mellitus without complication, with long-term current use of insulin (HCC)   Essential hypertension   GERD   Constipation   Hyperlipidemia   Hyperkalemia   Sepsis ([place])   AKI (acute kidney injury) ([place])   Delirium   Acute urinary retention   Ascites  #1 diabetic ketoacidosis Questionable etiology. Concern for sepsis. Urine cultures negative. Blood cultures were no growth to date. DKA resolved. Anion gap closed. Patient has been transitioned to subcutaneous insulin and sliding scale insulin. Check a hemoglobin A1c. CBGs have ranged from 69-237. Advance diet to full liquids and then as tolerated to a carb modified diet. Continue Levemir 8 units twice daily. Sliding scale insulin. Follow.  #2 hypertension Patient's Cozaar was discontinued on admission secondary to acute kidney injury. Place on  Norvasc 5 mg daily and titrate as needed.  #3 elevated troponin Patient was noted to have elevated troponin on admission of 0.44. Likely stress related. Patient denies any chest pain. EKG with no ischemic changes. Check a 2-D echo.  #4 sepsis Concern for sepsis on admission as patient was noted to have a elevated lactic acid level, low-grade fever, tachycardic. Patient was pancultured with urine cultures with no growth to date. Blood cultures with no growth to date. Lactic acid level trending down. Chest x-ray negative for any acute infiltrate. Leukocytosis improved. IV antibiotics have been narrowed down to IV Rocephin. Will treat empirically for total of 7 days. Follow.  #5 acute urinary retention Patient had presented with Foley catheter recently placed in the ED for acute urinary retention.?? Secondary to ascites versus constipation. Family wants a voiding trial. Will DC Foley catheter and monitor. If patient still with urinary retention will have to place Foley catheter back in, and Flomax and will need urology outpatient follow-up.  #6 metabolic encephalopathy Secondary to problem #1. Improved. Close to baseline. Continue empiric IV antibiotics.  #7 abdominal ascites Noted on CT abdomen and pelvis on admission. Patient with complaints of abdominal pain/discomfort. Patient's LFTs within normal limits. Check a TSH. Diagnostic and therapeutic paracentesis. Check LDH. Follow.  #8 hypothyroidism Check a TSH. Continue home dose Synthroid.  #9 gastroesophageal reflux disease PPI.  #10 hyperlipidemia Continue statin.     DVT prophylaxis: Lovenox Code Status: Full Family Communication: Updated patient and daughter at bedside. Disposition Plan: Remain the step down unit today. A blood pressure improved and better control and patient hemodynamically stable, transfer to telemetry tomorrow 11/28/2016.   Consultants:  None  Procedures:   CT head without contrast 11/25/2016  CT  abdomen and pelvis 11/25/2016  Chest x-ray 11/25/2016    Antimicrobials:  IV Rocephin 11/26/2016  IV Zosyn 11/25/2016 >>> 11/26/2016  IV vancomycin 11/25/2016>>>> 11/26/2016     Subjective: Patient sitting up in bed. Patient eating Jell-O. Patient denies any shortness of breath. No chest pain. Patient complaining of some abdominal discomfort. Patient states had a bowel movement yesterday. Patient and daughter asking when the Foley catheter can be removed.  Objective: Vitals:   11/27/16 0339 11/27/16 0400 11/27/16 0600 11/27/16 0800  BP:  (!) 167/57 (!) 164/61 (!) 185/68  Pulse:  65 65 67  Resp:  18    Temp: 98.1 F (36.7 C)   98.4 F (36.9 C)  TempSrc: Oral   Oral  SpO2:  98% 97% 97%  Weight:      Height:        Intake/Output Summary (Last 24 hours) at 11/27/16 0949 Last data filed at 11/27/16 0800  Gross per 24 hour  Intake             1650 ml  Output             1010 ml  Net              640 ml   Filed Weights   11/25/16 1757 11/26/16 0017  Weight: 63.5 kg (140 lb) 61.1 kg (134 lb 11.2 oz)    Examination:  General exam: Appears calm and comfortable  Respiratory system: Clear to auscultation Bilaterally. Respiratory effort normal. Cardiovascular system: S1 & S2 heard, RRR. No JVD, murmurs, rubs, gallops or clicks. No pedal edema. Gastrointestinal system: Abdomen is distended, soft and diffuse tenderness to palpation. . No organomegaly or masses felt. Normal bowel sounds heard. Central nervous system: Alert and oriented. No focal neurological deficits. Extremities: Symmetric 5 x 5 power. Skin: No rashes, lesions or ulcers Psychiatry: Judgement and insight appear fair. Mood & affect appropriate.     Data Reviewed: I have personally reviewed following labs and imaging studies  CBC:  Recent Labs Lab 11/24/16 2159 11/25/16 1506 11/27/16 0320  WBC 7.2 12.0* 8.8  NEUTROABS  --   --  6.9  HGB 10.1* 11.7* 10.0*  HCT 30.8* 35.2* 30.1*  MCV 91.7 92.9 90.7   PLT 289 286 700   Basic Metabolic Panel:  Recent Labs Lab 11/25/16 1506 11/26/16 0106 11/26/16 0511 11/26/16 1431 11/27/16 0320  NA 129* 137 140 141 135  K 5.8* 4.2 3.8 3.6 3.7  CL 92* 106 108 111 105  CO2 17* 17* 24 24 24   GLUCOSE 596* 398* 138* 127* 195*  BUN 36* 40* 42* 31* 20  CREATININE 1.43* 1.57* 1.40* 1.26* 0.88  CALCIUM 9.7 9.0 8.7* 8.4* 7.9*  MG  --  1.9  --   --   --   PHOS  --  2.8  --   --   --    GFR: Estimated Creatinine Clearance: 49.2 mL/min (by C-G formula based on SCr of 0.88 mg/dL). Liver Function Tests:  Recent Labs Lab 11/24/16 2159 11/25/16 1506  AST 28 27  ALT 29 29  ALKPHOS 62 71  BILITOT 0.5 1.1  PROT 7.5 7.4  ALBUMIN 4.4 4.2    Recent Labs Lab 11/24/16 2159 11/26/16 0511  LIPASE 77* 22    Recent Labs Lab 11/25/16 1608  AMMONIA 14   Coagulation Profile: No results for input(s): INR, PROTIME in the last 168 hours. Cardiac  Enzymes:  Recent Labs Lab 11/26/16 0106  TROPONINI 0.44*   BNP (last 3 results) No results for input(s): PROBNP in the last 8760 hours. HbA1C: No results for input(s): HGBA1C in the last 72 hours. CBG:  Recent Labs Lab 11/26/16 1604 11/26/16 1937 11/26/16 2333 11/27/16 0305 11/27/16 0825  GLUCAP 100* 214* 237* 181* 69   Lipid Profile: No results for input(s): CHOL, HDL, LDLCALC, TRIG, CHOLHDL, LDLDIRECT in the last 72 hours. Thyroid Function Tests: No results for input(s): TSH, T4TOTAL, FREET4, T3FREE, THYROIDAB in the last 72 hours. Anemia Panel: No results for input(s): VITAMINB12, FOLATE, FERRITIN, TIBC, IRON, RETICCTPCT in the last 72 hours. Sepsis Labs:  Recent Labs Lab 11/25/16 1708 11/25/16 2143 11/26/16 0106 11/26/16 0511 11/27/16 0320  PROCALCITON  --   --  2.29  --  1.74  LATICACIDVEN 3.39* 2.94* 3.4* 2.0*  --     Recent Results (from the past 240 hour(s))  Urine culture     Status: Abnormal   Collection Time: 11/25/16  1:17 AM  Result Value Ref Range Status    Specimen Description URINE, RANDOM  Final   Special Requests NONE  Final   Culture MULTIPLE SPECIES PRESENT, SUGGEST RECOLLECTION (A)  Final   Report Status 11/26/2016 FINAL  Final  Urine culture     Status: None   Collection Time: 11/25/16  3:07 PM  Result Value Ref Range Status   Specimen Description URINE, CLEAN CATCH  Final   Special Requests NONE  Final   Culture   Final    NO GROWTH Performed at [facility], [place] [address] [place], [place] [address]    Report Status 11/27/2016 FINAL  Final  Blood Culture (routine x 2)     Status: None (Preliminary result)   Collection Time: 11/25/16  6:49 PM  Result Value Ref Range Status   Specimen Description BLOOD LEFT HAND  Final   Special Requests IN PEDIATRIC BOTTLE Blood Culture adequate volume  Final   Culture   Final    NO GROWTH < 12 HOURS Performed at [facility], [place] [address], [place], [place] [address]    Report Status PENDING  Incomplete  MRSA PCR Screening     Status: None   Collection Time: 11/26/16 12:23 AM  Result Value Ref Range Status   MRSA by PCR NEGATIVE NEGATIVE Final    Comment:        The GeneXpert MRSA Assay (FDA approved for NASAL specimens only), is one component of a comprehensive MRSA colonization surveillance program. It is not intended to diagnose MRSA infection nor to guide or monitor treatment for MRSA infections.          Radiology Studies: Ct Head Wo Contrast  Result Date: 11/25/2016 CLINICAL DATA:  Altered mental status. EXAM: CT HEAD WITHOUT CONTRAST TECHNIQUE: Contiguous axial images were obtained from the base of the skull through the vertex without intravenous contrast. COMPARISON:  Head CT 11/03/2013 FINDINGS: Brain: No evidence of acute infarction, hemorrhage, hydrocephalus, extra-axial collection or mass lesion/mass effect. Stable degree of atrophy and chronic last. Vascular: Atherosclerosis of skullbase vasculature without hyperdense vessel or abnormal calcification.  Skull: No fracture or focal lesion. Sinuses/Orbits: Minimal chronic opacification of lower left mastoid air cells. Right mastoid air cells are clear. Visualized orbits and paranasal sinuses are unremarkable. Other: None. IMPRESSION: Unchanged atrophy and chronic small vessel ischemia. No acute intracranial abnormality. Electronically Signed   By: Jeb Levering M.D.   On: 11/25/2016 19:34  Dg Chest Port 1 View  Result Date: 11/25/2016 CLINICAL DATA:  Acute onset of altered mental status. Urinary retention. Initial encounter. EXAM: PORTABLE CHEST 1 VIEW COMPARISON:  Chest radiograph performed 02/12/2016 FINDINGS: The lungs are well-aerated and clear. There is no evidence of focal opacification, pleural effusion or pneumothorax. The cardiomediastinal silhouette is within normal limits. No acute osseous abnormalities are seen. Calcification overlying the right humeral head likely reflects calcific tendinitis. IMPRESSION: 1. No acute cardiopulmonary process seen. 2. Calcific tendinitis at the right rotator cuff. Electronically Signed   By: Garald Balding M.D.   On: 11/25/2016 18:42        Scheduled Meds: . amLODipine  5 mg Oral Daily  . atorvastatin  20 mg Oral q1800  . enoxaparin (LOVENOX) injection  40 mg Subcutaneous Q24H  . insulin aspart  0-9 Units Subcutaneous Q4H  . insulin detemir  8 Units Subcutaneous BID  . levothyroxine  100 mcg Oral QAC breakfast  . pantoprazole  40 mg Oral Daily   Continuous Infusions: . sodium chloride 75 mL/hr at 11/27/16 0800  . cefTRIAXone (ROCEPHIN)  IV Stopped (11/26/16 1003)  . insulin (NOVOLIN-R) infusion Stopped (11/26/16 0932)     LOS: 2 days    Time spent: 84 minutes    THOMPSON,DANIEL, MD [facility] Pager [phone]  If 7PM-7AM, please contact night-coverage [email or web address] Password [facility] 11/27/2016, 9:49 AM

## 2016-11-27 NOTE — Progress Notes (Signed)
   11/27/16 1400  Clinical Encounter Type  Visited With Patient and family together  Visit Type Initial;Psychological support;Spiritual support;Critical Care  Referral From Nurse  Consult/Referral To Chaplain  Spiritual Encounters  Spiritual Needs Other (Comment) (Advance Directive Education)  Stress Factors  Patient Stress Factors Other (Comment) (Advance directive Education)  Family Stress Factors Other (Comment) Market researcher Education)  Advance Directives (For Healthcare)  Does Patient Have a Medical Advance Directive? No  Would patient like information on creating a medical advance directive? Yes (MAU/Ambulatory/Procedural Areas - Information given)   I visited with the patient and we discussed creating an Advance Directive.  The patient stated that she would look over the document and have Spiritual Care paged when she is ready to complete.  Badger Lee M.Div.

## 2016-11-28 ENCOUNTER — Other Ambulatory Visit (HOSPITAL_COMMUNITY): Payer: Medicare HMO

## 2016-11-28 LAB — COMPREHENSIVE METABOLIC PANEL
ALBUMIN: 3.1 g/dL — AB (ref 3.5–5.0)
ALT: 16 U/L (ref 14–54)
AST: 19 U/L (ref 15–41)
Alkaline Phosphatase: 53 U/L (ref 38–126)
Anion gap: 8 (ref 5–15)
BILIRUBIN TOTAL: 0.6 mg/dL (ref 0.3–1.2)
BUN: 14 mg/dL (ref 6–20)
CHLORIDE: 106 mmol/L (ref 101–111)
CO2: 23 mmol/L (ref 22–32)
CREATININE: 0.79 mg/dL (ref 0.44–1.00)
Calcium: 8.5 mg/dL — ABNORMAL LOW (ref 8.9–10.3)
GFR calc Af Amer: >60 mL/min (ref >60)
GLUCOSE: 91 mg/dL (ref 65–99)
Potassium: 3.4 mmol/L — ABNORMAL LOW (ref 3.5–5.1)
Sodium: 137 mmol/L (ref 135–145)
TOTAL PROTEIN: 6 g/dL — AB (ref 6.5–8.1)

## 2016-11-28 LAB — CBC WITH DIFFERENTIAL/PLATELET
Basophils Absolute: 0 10*3/uL (ref 0.0–0.1)
Basophils Relative: 0 %
EOS ABS: 0 10*3/uL (ref 0.0–0.7)
EOS PCT: 0 %
HCT: 36.6 % (ref 36.0–46.0)
Hemoglobin: 12.5 g/dL (ref 12.0–15.0)
LYMPHS ABS: 0.8 10*3/uL (ref 0.7–4.0)
Lymphocytes Relative: 9 %
MCH: 30.7 pg (ref 26.0–34.0)
MCHC: 34.2 g/dL (ref 30.0–36.0)
MCV: 89.9 fL (ref 78.0–100.0)
MONO ABS: 0.9 10*3/uL (ref 0.1–1.0)
Monocytes Relative: 10 %
Neutro Abs: 7.4 10*3/uL (ref 1.7–7.7)
Neutrophils Relative %: 81 %
PLATELETS: 284 10*3/uL (ref 150–400)
RBC: 4.07 MIL/uL (ref 3.87–5.11)
RDW: 13.8 % (ref 11.5–15.5)
WBC: 9.1 10*3/uL (ref 4.0–10.5)

## 2016-11-28 LAB — HEMOGLOBIN A1C
Hgb A1c MFr Bld: 8.9 % — ABNORMAL HIGH (ref 4.8–5.6)
MEAN PLASMA GLUCOSE: 209 mg/dL

## 2016-11-28 LAB — GLUCOSE, CAPILLARY
GLUCOSE-CAPILLARY: 117 mg/dL — AB (ref 65–99)
GLUCOSE-CAPILLARY: 76 mg/dL (ref 65–99)
GLUCOSE-CAPILLARY: 87 mg/dL (ref 65–99)
GLUCOSE-CAPILLARY: 99 mg/dL (ref 65–99)
Glucose-Capillary: 132 mg/dL — ABNORMAL HIGH (ref 65–99)
Glucose-Capillary: 160 mg/dL — ABNORMAL HIGH (ref 65–99)
Glucose-Capillary: 108 mg/dL — ABNORMAL HIGH (ref 65–99)
Glucose-Capillary: 116 mg/dL — ABNORMAL HIGH (ref 65–99)

## 2016-11-28 NOTE — Progress Notes (Signed)
Notified MD of loose stools x3 this AM - no interventions at this time.

## 2016-11-28 NOTE — Progress Notes (Signed)
PROGRESS NOTE    [name] [name]  IDP:[id number] DOB: [birth year] DOA: 11/25/2016 PCP: Aura Dials, MD    Brief Narrative:  83 yo female who presented with the chief complain of confusion, malaise and abdominal pain. Patient known to have insulin dependent diabetes mellitus and hypothyroidism. 3 days before admission her insulin regimen was modified, decreased dose of insulin, 24 hours later patient became hyperglycemic, came to the ED, glucose was stabilized and foley catheter was placed due to urinary retention and discharged home. The day of admission patient developed worsening confusion, pulled foley catheter, producing hematuria. On the initial examination, temperature was 100.5 with HR 120 and respiratory rate 38. Patient confused unable to follow commands, her lung were clear to auscultation, heart s1-s2 present and soft abdomen. Serum glucose 596 with anion gap of 20. Patient was admitted with the working diagnosis of DKA complicated with sepsis. Patient responding to insulin regimen and antibiotic therapy. Positive urinary retention, foley catheter placed.     Assessment & Plan:   Principal Problem:   DKA (diabetic ketoacidoses) ([place]) Active Problems:   Hypothyroidism, acquired   Type 2 diabetes mellitus without complication, with long-term current use of insulin (HCC)   Essential hypertension   GERD   Constipation   Hyperlipidemia   Hyperkalemia   Sepsis ([place])   AKI (acute kidney injury) ([place])   Delirium   Acute urinary retention   Ascites   Abdominal distension   Facial droop   1. DKA. Resolved. No nausea or vomiting. Glucose with better control.   2. Sepsis due to suspected urine infection, to rule out related to indwelling foley catheter, present on admission. Antibiotic therapy with ceftriaxone #2. Will continue saline for hydration. Patient with persistent urinary retention, had vagal response when bladder catheterization was performed, will replace foley  cathter.  3. AKI with hyperkalemia. Serum cr down to 0.79 with K at 3,4 and serum bicarbonate at 23. Will continue to follow renal panel in am, avoid hypotension or nephrotoxic medications. Will continue with isotonic solutions at 75 ml per hour.   4. Metabolic encephalopathy. Patient more awake and alert, no confusion or agitation.   5. Hypothyroidism. On levothyroxine per home regimen.   6. Dyslipidemia. On simvastatin.   7. T2DM. Patient on insulin sliding scale for glucose cover and monitoring, tolerating po well, capillary glucose 87, 76, 132. Basal insulin with 8 units bid.   8. HTN. Positive for orthostatics, will continue amlodipine for blood pressure control. Continue hydration.    DVT prophylaxis: enoxaparin  Code Status: Full  Family Communication:  Disposition Plan: Home    Consultants:     Procedures:    Antimicrobials:   Ceftriaxone.    Subjective: Patient had episodes of hypotension, while urine catheterization, transitory. No nausea or vomiting, tolerating po, no chest pain or dyspnea.   Objective: Vitals:   11/28/16 0457 11/28/16 0500 11/28/16 0600 11/28/16 0700  BP:  (!) 154/74 (!) 175/63 (!) 155/54  Pulse:  95 79 (!) 103  Resp:  (!) 22 14 (!) 21  Temp: 98.2 F (36.8 C)     TempSrc: Oral     SpO2:  99% 98% 98%  Weight:      Height:        Intake/Output Summary (Last 24 hours) at 11/28/16 0830 Last data filed at 11/28/16 0700  Gross per 24 hour  Intake             1750 ml  Output  700 ml  Net             1050 ml   Filed Weights   11/25/16 1757 11/26/16 0017  Weight: 63.5 kg (140 lb) 61.1 kg (134 lb 11.2 oz)    Examination:  General exam: deconditioned, not in pain or dyspnea E ENT: Mild pallor, no icterus.  Respiratory system: No wheezing, rales or rhonchi. Respiratory effort normal. Cardiovascular system: S1 & S2 heard, RRR. No JVD, murmurs, rubs, gallops or clicks. No pedal edema. Gastrointestinal system:  Abdomen is nondistended, soft and nontender. No organomegaly or masses felt. Normal bowel sounds heard. Central nervous system: Alert and oriented. No focal neurological deficits. Extremities: Symmetric 5 x 5 power. Skin: No rashes, lesions or ulcers     Data Reviewed: I have personally reviewed following labs and imaging studies  CBC:  Recent Labs Lab 11/24/16 2159 11/25/16 1506 11/27/16 0320 11/28/16 0403  WBC 7.2 12.0* 8.8 9.1  NEUTROABS  --   --  6.9 7.4  HGB 10.1* 11.7* 10.0* 12.5  HCT 30.8* 35.2* 30.1* 36.6  MCV 91.7 92.9 90.7 89.9  PLT 289 286 253 500   Basic Metabolic Panel:  Recent Labs Lab 11/26/16 0106 11/26/16 0511 11/26/16 1431 11/27/16 0320 11/28/16 0403  NA 137 140 141 135 137  K 4.2 3.8 3.6 3.7 3.4*  CL 106 108 111 105 106  CO2 17* 24 24 24 23   GLUCOSE 398* 138* 127* 195* 91  BUN 40* 42* 31* 20 14  CREATININE 1.57* 1.40* 1.26* 0.88 0.79  CALCIUM 9.0 8.7* 8.4* 7.9* 8.5*  MG 1.9  --   --   --   --   PHOS 2.8  --   --   --   --    GFR: Estimated Creatinine Clearance: 54.1 mL/min (by C-G formula based on SCr of 0.79 mg/dL). Liver Function Tests:  Recent Labs Lab 11/24/16 2159 11/25/16 1506 11/28/16 0403  AST 28 27 19   ALT 29 29 16   ALKPHOS 62 71 53  BILITOT 0.5 1.1 0.6  PROT 7.5 7.4 6.0*  ALBUMIN 4.4 4.2 3.1*    Recent Labs Lab 11/24/16 2159 11/26/16 0511  LIPASE 77* 22    Recent Labs Lab 11/25/16 1608  AMMONIA 14   Coagulation Profile: No results for input(s): INR, PROTIME in the last 168 hours. Cardiac Enzymes:  Recent Labs Lab 11/26/16 0106  TROPONINI 0.44*   BNP (last 3 results) No results for input(s): PROBNP in the last 8760 hours. HbA1C:  Recent Labs  11/27/16 1149  HGBA1C 8.9*   CBG:  Recent Labs Lab 11/27/16 2044 11/28/16 0008 11/28/16 0256 11/28/16 0455 11/28/16 0823  GLUCAP 163* 117* 87 99 76   Lipid Profile: No results for input(s): CHOL, HDL, LDLCALC, TRIG, CHOLHDL, LDLDIRECT in the last  72 hours. Thyroid Function Tests:  Recent Labs  11/27/16 1149  TSH 0.903   Anemia Panel: No results for input(s): VITAMINB12, FOLATE, FERRITIN, TIBC, IRON, RETICCTPCT in the last 72 hours. Sepsis Labs:  Recent Labs Lab 11/25/16 1708 11/25/16 2143 11/26/16 0106 11/26/16 0511 11/27/16 0320  PROCALCITON  --   --  2.29  --  1.74  LATICACIDVEN 3.39* 2.94* 3.4* 2.0*  --     Recent Results (from the past 240 hour(s))  Urine culture     Status: Abnormal   Collection Time: 11/25/16  1:17 AM  Result Value Ref Range Status   Specimen Description URINE, RANDOM  Final   Special Requests NONE  Final  Culture MULTIPLE SPECIES PRESENT, SUGGEST RECOLLECTION (A)  Final   Report Status 11/26/2016 FINAL  Final  Urine culture     Status: None   Collection Time: 11/25/16  3:07 PM  Result Value Ref Range Status   Specimen Description URINE, CLEAN CATCH  Final   Special Requests NONE  Final   Culture   Final    NO GROWTH Performed at [facility], [place] [address], [place], [place] [address]    Report Status 11/27/2016 FINAL  Final  Blood Culture (routine x 2)     Status: None (Preliminary result)   Collection Time: 11/25/16  6:49 PM  Result Value Ref Range Status   Specimen Description BLOOD LEFT HAND  Final   Special Requests IN PEDIATRIC BOTTLE Blood Culture adequate volume  Final   Culture   Final    NO GROWTH 2 DAYS Performed at [facility], [place] [address], [place], [place] [address]    Report Status PENDING  Incomplete  MRSA PCR Screening     Status: None   Collection Time: 11/26/16 12:23 AM  Result Value Ref Range Status   MRSA by PCR NEGATIVE NEGATIVE Final    Comment:        The GeneXpert MRSA Assay (FDA approved for NASAL specimens only), is one component of a comprehensive MRSA colonization surveillance program. It is not intended to diagnose MRSA infection nor to guide or monitor treatment for MRSA infections.          Radiology Studies: Ct  Head Wo Contrast  Result Date: 11/27/2016 CLINICAL DATA:  New facial droop. EXAM: CT HEAD WITHOUT CONTRAST TECHNIQUE: Contiguous axial images were obtained from the base of the skull through the vertex without intravenous contrast. COMPARISON:  Head CT 11/25/16 FINDINGS: Brain: No mass lesion, intraparenchymal hemorrhage or extra-axial collection. No evidence of acute cortical infarct. There is unchanged ventriculomegaly. There is periventricular hypoattenuation compatible with chronic microvascular disease. Vascular: No hyperdense vessel or unexpected calcification. Skull: Normal visualized skull base, calvarium and extracranial soft tissues. Sinuses/Orbits: No sinus fluid levels or advanced mucosal thickening. No mastoid effusion. Normal orbits. IMPRESSION: 1. No acute intracranial abnormality. 2. Chronic ventriculomegaly and microvascular ischemia. Electronically Signed   By: Ulyses Jarred M.D.   On: 11/27/2016 18:29   US Abdomen Limited  Result Date: 11/27/2016 CLINICAL DATA:  Ascites EXAM: LIMITED ABDOMINAL ULTRASOUND TO ASSESS FOR ASCITES COMPARISON:  CT abdomen and pelvis Nov 25, 2016 FINDINGS: There is mild ascites throughout the abdomen. There are multiple loops of peristalsing bowel. IMPRESSION: Mild ascites. Electronically Signed   By: Lowella Grip III M.D.   On: 11/27/2016 11:10        Scheduled Meds: . amLODipine  5 mg Oral Daily  . atorvastatin  20 mg Oral q1800  . enoxaparin (LOVENOX) injection  40 mg Subcutaneous Q24H  . insulin aspart  0-9 Units Subcutaneous Q4H  . insulin detemir  8 Units Subcutaneous BID  . levothyroxine  100 mcg Oral QAC breakfast  . pantoprazole  40 mg Oral Daily   Continuous Infusions: . sodium chloride 100 mL/hr at 11/28/16 0130  . cefTRIAXone (ROCEPHIN)  IV Stopped (11/27/16 1046)     LOS: 3 days       Royalty Domagala Gerome Apley, MD [facility] Pager [phone]  If 7PM-7AM, please contact night-coverage [email or web address] Password  [email or web address] 11/28/2016, 8:30 AM

## 2016-11-28 NOTE — Progress Notes (Signed)
PT Cancellation Note  Patient Details Name: [name] [name] MRN: [id number] DOB: 09-01-1933   Cancelled Treatment:    Reason Eval/Treat Not Completed: Medical issues which prohibited therapy (Per RN, orthostatic hypotension.)   Marcelino Freestone PT [phone]  11/28/2016, 12:32 PM

## 2016-11-29 ENCOUNTER — Inpatient Hospital Stay (HOSPITAL_COMMUNITY): Payer: Medicare HMO

## 2016-11-29 DIAGNOSIS — N390 Urinary tract infection, site not specified: Secondary | ICD-10-CM

## 2016-11-29 LAB — PROCALCITONIN: PROCALCITONIN: 0.57 ng/mL

## 2016-11-29 LAB — GLUCOSE, CAPILLARY
GLUCOSE-CAPILLARY: 148 mg/dL — AB (ref 65–99)
Glucose-Capillary: 123 mg/dL — ABNORMAL HIGH (ref 65–99)
Glucose-Capillary: 125 mg/dL — ABNORMAL HIGH (ref 65–99)
Glucose-Capillary: 185 mg/dL — ABNORMAL HIGH (ref 65–99)
Glucose-Capillary: 77 mg/dL (ref 65–99)
Glucose-Capillary: 87 mg/dL (ref 65–99)

## 2016-11-29 LAB — BASIC METABOLIC PANEL
ANION GAP: 9 (ref 5–15)
BUN: 15 mg/dL (ref 6–20)
CHLORIDE: 106 mmol/L (ref 101–111)
CO2: 20 mmol/L — AB (ref 22–32)
Calcium: 8.2 mg/dL — ABNORMAL LOW (ref 8.9–10.3)
Creatinine, Ser: 0.81 mg/dL (ref 0.44–1.00)
GFR calc Af Amer: >60 mL/min (ref >60)
GFR calc non Af Amer: >60 mL/min (ref >60)
GLUCOSE: 118 mg/dL — AB (ref 65–99)
POTASSIUM: 3.5 mmol/L (ref 3.5–5.1)
Sodium: 135 mmol/L (ref 135–145)

## 2016-11-29 LAB — CBC WITH DIFFERENTIAL/PLATELET
BASOS ABS: 0 10*3/uL (ref 0.0–0.1)
Basophils Relative: 0 %
Eosinophils Absolute: 0 10*3/uL (ref 0.0–0.7)
Eosinophils Relative: 0 %
HEMATOCRIT: 36.5 % (ref 36.0–46.0)
Hemoglobin: 12.3 g/dL (ref 12.0–15.0)
LYMPHS PCT: 9 %
Lymphs Abs: 0.8 10*3/uL (ref 0.7–4.0)
MCH: 30.5 pg (ref 26.0–34.0)
MCHC: 33.7 g/dL (ref 30.0–36.0)
MCV: 90.6 fL (ref 78.0–100.0)
MONO ABS: 0.7 10*3/uL (ref 0.1–1.0)
Monocytes Relative: 8 %
NEUTROS ABS: 7.4 10*3/uL (ref 1.7–7.7)
Neutrophils Relative %: 83 %
Platelets: 286 10*3/uL (ref 150–400)
RBC: 4.03 MIL/uL (ref 3.87–5.11)
RDW: 14 % (ref 11.5–15.5)
WBC: 8.9 10*3/uL (ref 4.0–10.5)

## 2016-11-29 LAB — ECHOCARDIOGRAM COMPLETE
HEIGHTINCHES: 67 in
WEIGHTICAEL: 2192 [oz_av]

## 2016-11-29 NOTE — Evaluation (Addendum)
Physical Therapy Evaluation Patient Details Name: [name] [name] MRN: [id number] DOB: 1933/08/01 Today's Date: 11/29/2016   History of Present Illness   83 y.o. female with a past medical history significant for IDDM, hypothyroidism and HTN who presents with 2 days abdominal pain, malaise and now confusion.  Clinical Impression  Pt admitted with above diagnosis. Pt currently with functional limitations due to the deficits listed below (see PT Problem List). Pt ambualted 160' with RW, HR 130 max, no loss of balance, no dyspnea. Expect pt can return home where she was independent with mobility using her cane prior to admission.  Pt will benefit from skilled PT to increase their independence and safety with mobility to allow discharge to the venue listed below.       Follow Up Recommendations No PT follow up    Equipment Recommendations  None recommended by PT    Recommendations for Other Services       Precautions / Restrictions Precautions Precautions: Fall Precaution Comments: h/o 1 fall in past 1 year Restrictions Weight Bearing Restrictions: No      Mobility  Bed Mobility Overal bed mobility: Independent                Transfers Overall transfer level: Needs assistance Equipment used: Rolling walker (2 wheeled) Transfers: Sit to/from Stand Sit to Stand: Supervision         General transfer comment: VCs hand placement, uncontrolled descent to recliner  Ambulation/Gait  Assistance: min A for RW management with turns Ambulation Distance (Feet): 160 Feet Assistive device: Rolling walker (2 wheeled) Gait Pattern/deviations: WFL(Within Functional Limits)   Gait velocity interpretation: at or above normal speed for age/gender General Gait Details: steady, no LOB, HR up to 130 max, no dyspnea; min A to manage RW with turns  Science writer    Modified Rankin (Stroke Patients Only)       Balance Overall balance assessment:  Modified Independent                                           Pertinent Vitals/Pain Pain Assessment: 0-10 Pain Score: 6  Pain Location: abdomen Pain Descriptors / Indicators: Sore Pain Intervention(s): Limited activity within patient's tolerance;Monitored during session    Home Living Family/patient expects to be discharged to:: Private residence Living Arrangements: Alone Available Help at Discharge: Available PRN/intermittently;Family Type of Home: House Home Access: Stairs to enter Entrance Stairs-Rails: None Entrance Stairs-Number of Steps: 3 Home Layout: One level Home Equipment: [place] - 4 wheels;Cane - single point;Shower seat      Prior Function Level of Independence: Independent with assistive device(s)         Comments: drives, independent bathing in shower, walks with cane     Hand Dominance        Extremity/Trunk Assessment   Upper Extremity Assessment Upper Extremity Assessment: Overall WFL for tasks assessed    Lower Extremity Assessment Lower Extremity Assessment: Overall WFL for tasks assessed    Cervical / Trunk Assessment Cervical / Trunk Assessment: Normal  Communication   Communication: HOH  Cognition Arousal/Alertness: Awake/alert Behavior During Therapy: WFL for tasks assessed/performed Overall Cognitive Status: Within Functional Limits for tasks assessed  General Comments      Exercises     Assessment/Plan    PT Assessment Patient needs continued PT services  PT Problem List Decreased activity tolerance;Pain;Decreased mobility       PT Treatment Interventions DME instruction;Gait training;Functional mobility training;Therapeutic exercise;Therapeutic activities;Patient/family education    PT Goals (Current goals can be found in the Care Plan section)  Acute Rehab PT Goals Patient Stated Goal: return home PT Goal Formulation: With patient Time For  Goal Achievement: 12/13/16 Potential to Achieve Goals: Good    Frequency Min 3X/week   Barriers to discharge        Co-evaluation               AM-PAC PT "6 Clicks" Daily Activity  Outcome Measure   Difficulty moving from lying on back to sitting on the side of the bed? : None Difficulty sitting down on and standing up from a chair with arms (e.g., wheelchair, bedside commode, etc,.)?: A Little Help needed moving to and from a bed to chair (including a wheelchair)?: None Help needed walking in hospital room?: None Help needed climbing 3-5 steps with a railing? : A Little 6 Click Score: 18    End of Session Equipment Utilized During Treatment: Gait belt Activity Tolerance: Patient tolerated treatment well Patient left: in chair;with call bell/phone within reach;with chair alarm set Nurse Communication: Mobility status PT Visit Diagnosis: History of falling (Z91.81);Difficulty in walking, not elsewhere classified (R26.2)    Time: 8110-3159 PT Time Calculation (min) (ACUTE ONLY): 26 min   Charges:   PT Evaluation $PT Eval Low Complexity: 1 Procedure PT Treatments $Gait Training: 8-22 mins   PT G Codes:        Philomena Doheny 11/29/2016, 8:58 AM [phone]

## 2016-11-29 NOTE — Progress Notes (Signed)
PROGRESS NOTE    [name] [name]  YBO:[id number] DOB: [birth year] DOA: 11/25/2016 PCP: Aura Dials, MD    Brief Narrative:  83 yo female who presented with the chief complain of confusion, malaise and abdominal pain. Patient known to have insulin dependent diabetes mellitus and hypothyroidism. 3 days before admission her insulin regimen was modified, decreased dose of insulin, 24 hours later patient became hyperglycemic, came to the ED, glucose was stabilized and foley catheter was placed due to urinary retention and discharged home. The day of admission patient developed worsening confusion, pulled foley catheter, producing hematuria. On the initial examination, temperature was 100.5 with HR 120 and respiratory rate 38. Patient confused unable to follow commands, her lung were clear to auscultation, heart s1-s2 present and soft abdomen. Serum glucose 596 with anion gap of 20. Patient was admitted with the working diagnosis of DKA complicated with sepsis. Patient responding to insulin regimen and antibiotic therapy. Positive urinary retention, foley catheter placed. Transferred to medical unit (05/13)    Assessment & Plan:   Principal Problem:   DKA (diabetic ketoacidoses) ([facility]) Active Problems:   Hypothyroidism, acquired   Type 2 diabetes mellitus without complication, with long-term current use of insulin ([facility])   Essential hypertension   GERD   Constipation   Hyperlipidemia   Hyperkalemia   Sepsis ([place])   AKI (acute kidney injury) ([facility])   Delirium   Acute urinary retention   Ascites   Abdominal distension   Facial droop   1. Sepsis due to suspected urine infection, to rule out related to indwelling foley catheter, present on admission. Antibiotic therapy with ceftriaxone #3. Decrease rata of saline to prevent volume overload. Will plan to keep foley catheter for now.   2. AKI with hyperkalemia. Resolved, will continue to follow renal panel. Avoid hypotension or nephrotoxic  medications.  3. Metabolic encephalopathy. No confusion or agitation. Patient able to respond to questions and follow commands.   4. Hypothyroidism. Continue levothyroxine.  5. Dyslipidemia. Continue simvastatin.   6. T2DM. Capillary glucose 123, 125, 185. Tolerating well a basal insulin dose of 8 units bid. Continue insulin sliding scale for glucose cover and monitoring.   7. HTN. Continue amlodipine for blood pressure control. Systolic in the 277'O.    DVT prophylaxis:enoxaparin  Code Status:Full  Family Communication: Disposition Plan:Home    Consultants:    Procedures:    Antimicrobials:   Ceftriaxone.   Subjective: Patient feeling well, no nausea or vomiting. Persistent abdominal pain, lower abdomen moderate in intensity, with no worsening factors, improved with pain medications. Foley catheter has been placed. No chest pain or dyspnea.   Objective: Vitals:   11/29/16 0331 11/29/16 0400 11/29/16 0600 11/29/16 0700  BP:  (!) 166/69    Pulse:  79 81 82  Resp:  (!) 26 14 15   Temp: 98.1 F (36.7 C)     TempSrc: Oral     SpO2:  97% 97% 97%  Weight:      Height:        Intake/Output Summary (Last 24 hours) at 11/29/16 0747 Last data filed at 11/29/16 0700  Gross per 24 hour  Intake          2336.67 ml  Output              710 ml  Net          1626.67 ml   Filed Weights   11/25/16 1757 11/26/16 0017  Weight: 63.5 kg (140 lb) 61.1 kg (134 lb  11.2 oz)    Examination:  General exam: not in pain or dyspnea E ENT: mild pallor, no icterus, oral mucosa moist.  Respiratory system: Clear to auscultation. Respiratory effort normal. No wheezing, rales or rhonchi.  Cardiovascular system: S1 & S2 heard, RRR. No JVD, murmurs, rubs, gallops or clicks. No pedal edema. Gastrointestinal system: Abdomen is nondistended, soft, mild tender to deep palpation. No organomegaly or masses felt. Normal bowel sounds heard. Central nervous system: Alert. No focal  neurological deficits. Extremities: Symmetric 5 x 5 power. Skin: No rashes, lesions or ulcers     Data Reviewed: I have personally reviewed following labs and imaging studies  CBC:  Recent Labs Lab 11/24/16 2159 11/25/16 1506 11/27/16 0320 11/28/16 0403 11/29/16 0404  WBC 7.2 12.0* 8.8 9.1 8.9  NEUTROABS  --   --  6.9 7.4 7.4  HGB 10.1* 11.7* 10.0* 12.5 12.3  HCT 30.8* 35.2* 30.1* 36.6 36.5  MCV 91.7 92.9 90.7 89.9 90.6  PLT 289 286 253 284 924   Basic Metabolic Panel:  Recent Labs Lab 11/26/16 0106 11/26/16 0511 11/26/16 1431 11/27/16 0320 11/28/16 0403 11/29/16 0404  NA 137 140 141 135 137 135  K 4.2 3.8 3.6 3.7 3.4* 3.5  CL 106 108 111 105 106 106  CO2 17* 24 24 24 23  20*  GLUCOSE 398* 138* 127* 195* 91 118*  BUN 40* 42* 31* 20 14 15   CREATININE 1.57* 1.40* 1.26* 0.88 0.79 0.81  CALCIUM 9.0 8.7* 8.4* 7.9* 8.5* 8.2*  MG 1.9  --   --   --   --   --   PHOS 2.8  --   --   --   --   --    GFR: Estimated Creatinine Clearance: 53.4 mL/min (by C-G formula based on SCr of 0.81 mg/dL). Liver Function Tests:  Recent Labs Lab 11/24/16 2159 11/25/16 1506 11/28/16 0403  AST 28 27 19   ALT 29 29 16   ALKPHOS 62 71 53  BILITOT 0.5 1.1 0.6  PROT 7.5 7.4 6.0*  ALBUMIN 4.4 4.2 3.1*    Recent Labs Lab 11/24/16 2159 11/26/16 0511  LIPASE 77* 22    Recent Labs Lab 11/25/16 1608  AMMONIA 14   Coagulation Profile: No results for input(s): INR, PROTIME in the last 168 hours. Cardiac Enzymes:  Recent Labs Lab 11/26/16 0106  TROPONINI 0.44*   BNP (last 3 results) No results for input(s): PROBNP in the last 8760 hours. HbA1C:  Recent Labs  11/27/16 1149  HGBA1C 8.9*   CBG:  Recent Labs Lab 11/28/16 1226 11/28/16 1624 11/28/16 1944 11/28/16 2319 11/29/16 0329  GLUCAP 132* 160* 108* 116* 123*   Lipid Profile: No results for input(s): CHOL, HDL, LDLCALC, TRIG, CHOLHDL, LDLDIRECT in the last 72 hours. Thyroid Function Tests:  Recent Labs   11/27/16 1149  TSH 0.903   Anemia Panel: No results for input(s): VITAMINB12, FOLATE, FERRITIN, TIBC, IRON, RETICCTPCT in the last 72 hours. Sepsis Labs:  Recent Labs Lab 11/25/16 1708 11/25/16 2143 11/26/16 0106 11/26/16 0511 11/27/16 0320 11/29/16 0404  PROCALCITON  --   --  2.29  --  1.74 0.57  LATICACIDVEN 3.39* 2.94* 3.4* 2.0*  --   --     Recent Results (from the past 240 hour(s))  Urine culture     Status: Abnormal   Collection Time: 11/25/16  1:17 AM  Result Value Ref Range Status   Specimen Description URINE, RANDOM  Final   Special Requests NONE  Final  Culture MULTIPLE SPECIES PRESENT, SUGGEST RECOLLECTION (A)  Final   Report Status 11/26/2016 FINAL  Final  Urine culture     Status: None   Collection Time: 11/25/16  3:07 PM  Result Value Ref Range Status   Specimen Description URINE, CLEAN CATCH  Final   Special Requests NONE  Final   Culture   Final    NO GROWTH Performed at [facility], [place] [address], [place], [place] [address]    Report Status 11/27/2016 FINAL  Final  Blood Culture (routine x 2)     Status: None (Preliminary result)   Collection Time: 11/25/16  6:49 PM  Result Value Ref Range Status   Specimen Description BLOOD LEFT HAND  Final   Special Requests IN PEDIATRIC BOTTLE Blood Culture adequate volume  Final   Culture   Final    NO GROWTH 3 DAYS Performed at [facility], [place] [address], [place], [place] [address]    Report Status PENDING  Incomplete  MRSA PCR Screening     Status: None   Collection Time: 11/26/16 12:23 AM  Result Value Ref Range Status   MRSA by PCR NEGATIVE NEGATIVE Final    Comment:        The GeneXpert MRSA Assay (FDA approved for NASAL specimens only), is one component of a comprehensive MRSA colonization surveillance program. It is not intended to diagnose MRSA infection nor to guide or monitor treatment for MRSA infections.          Radiology Studies: Ct Head Wo Contrast  Result  Date: 11/27/2016 CLINICAL DATA:  New facial droop. EXAM: CT HEAD WITHOUT CONTRAST TECHNIQUE: Contiguous axial images were obtained from the base of the skull through the vertex without intravenous contrast. COMPARISON:  Head CT 11/25/16 FINDINGS: Brain: No mass lesion, intraparenchymal hemorrhage or extra-axial collection. No evidence of acute cortical infarct. There is unchanged ventriculomegaly. There is periventricular hypoattenuation compatible with chronic microvascular disease. Vascular: No hyperdense vessel or unexpected calcification. Skull: Normal visualized skull base, calvarium and extracranial soft tissues. Sinuses/Orbits: No sinus fluid levels or advanced mucosal thickening. No mastoid effusion. Normal orbits. IMPRESSION: 1. No acute intracranial abnormality. 2. Chronic ventriculomegaly and microvascular ischemia. Electronically Signed   By: Ulyses Jarred M.D.   On: 11/27/2016 18:29   US Abdomen Limited  Result Date: 11/27/2016 CLINICAL DATA:  Ascites EXAM: LIMITED ABDOMINAL ULTRASOUND TO ASSESS FOR ASCITES COMPARISON:  CT abdomen and pelvis Nov 25, 2016 FINDINGS: There is mild ascites throughout the abdomen. There are multiple loops of peristalsing bowel. IMPRESSION: Mild ascites. Electronically Signed   By: Lowella Grip III M.D.   On: 11/27/2016 11:10        Scheduled Meds: . amLODipine  5 mg Oral Daily  . atorvastatin  20 mg Oral q1800  . enoxaparin (LOVENOX) injection  40 mg Subcutaneous Q24H  . insulin aspart  0-9 Units Subcutaneous Q4H  . insulin detemir  8 Units Subcutaneous BID  . levothyroxine  100 mcg Oral QAC breakfast  . pantoprazole  40 mg Oral Daily   Continuous Infusions: . sodium chloride 75 mL/hr at 11/29/16 0700  . cefTRIAXone (ROCEPHIN)  IV Stopped (11/28/16 1023)     LOS: 4 days       Ladarrion Telfair Gerome Apley, MD [facility] Pager [phone]  If 7PM-7AM, please contact night-coverage [email or web address] Password [facility] 11/29/2016, 7:47 AM

## 2016-11-29 NOTE — Progress Notes (Signed)
  Echocardiogram 2D Echocardiogram has been performed.  [name] [name] 11/29/2016, 2:39 PM

## 2016-11-30 ENCOUNTER — Inpatient Hospital Stay (HOSPITAL_COMMUNITY): Payer: Medicare HMO

## 2016-11-30 LAB — CULTURE, BLOOD (ROUTINE X 2)
Culture: NO GROWTH
SPECIAL REQUESTS: ADEQUATE

## 2016-11-30 LAB — GLUCOSE, CAPILLARY
GLUCOSE-CAPILLARY: 69 mg/dL (ref 65–99)
GLUCOSE-CAPILLARY: 104 mg/dL — AB (ref 65–99)
GLUCOSE-CAPILLARY: 178 mg/dL — AB (ref 65–99)
Glucose-Capillary: 127 mg/dL — ABNORMAL HIGH (ref 65–99)
Glucose-Capillary: 128 mg/dL — ABNORMAL HIGH (ref 65–99)
Glucose-Capillary: 149 mg/dL — ABNORMAL HIGH (ref 65–99)
Glucose-Capillary: 153 mg/dL — ABNORMAL HIGH (ref 65–99)
Glucose-Capillary: 81 mg/dL (ref 65–99)

## 2016-11-30 MED ORDER — CEPHALEXIN 500 MG PO CAPS: 500.0000 mg | ORAL_CAPSULE | Freq: Two times a day (BID) | ORAL | 0 refills | Status: AC

## 2016-11-30 MED ORDER — CEPHALEXIN 500 MG PO CAPS
500.0000 mg | ORAL_CAPSULE | Freq: Two times a day (BID) | ORAL | Status: DC
  Administered 2016-11-30 – 2016-12-01 (×2): 500 mg via ORAL
  Filled 2016-11-30 (×2): qty 1

## 2016-11-30 MED ORDER — NAPROXEN SODIUM 220 MG PO TABS: 440.0000 mg | ORAL_TABLET | Freq: Two times a day (BID) | ORAL | 0 refills | Status: DC | PRN

## 2016-11-30 MED ORDER — BISACODYL 5 MG PO TBEC
5.0000 mg | DELAYED_RELEASE_TABLET | Freq: Once | ORAL | Status: AC
Start: 2016-11-30 — End: 2016-11-30
  Administered 2016-11-30: 5 mg via ORAL
  Filled 2016-11-30: qty 1

## 2016-11-30 MED ORDER — SODIUM CHLORIDE 0.45 % IV SOLN
INTRAVENOUS | Status: DC
  Administered 2016-11-30 – 2016-12-01 (×2): via INTRAVENOUS

## 2016-11-30 MED ORDER — ACETAMINOPHEN 500 MG PO TABS: 500.0000 mg | ORAL_TABLET | Freq: Four times a day (QID) | ORAL | 0 refills | Status: DC | PRN

## 2016-11-30 MED ORDER — TRAMADOL HCL 50 MG PO TABS: 50.0000 mg | ORAL_TABLET | Freq: Four times a day (QID) | ORAL | Status: DC | PRN

## 2016-11-30 MED ORDER — MORPHINE SULFATE (PF) 4 MG/ML IV SOLN
1.0000 mg | INTRAVENOUS | Status: DC | PRN
  Administered 2016-11-30 – 2016-12-03 (×6): 1 mg via INTRAVENOUS
  Filled 2016-11-30 (×6): qty 1

## 2016-11-30 NOTE — Discharge Summary (Addendum)
Physician Discharge Summary  [name] [name] YQM:[id number] DOB: 1933/09/21 DOA: 11/25/2016  PCP: Aura Dials, MD  Admit date: 11/25/2016 Discharge date: 11/30/2016  Admitted From: Home  Disposition:  Home  Recommendations for Outpatient Follow-up:  1. Follow up with PCP in 1- week 2. Patient will have a voiding trial before discharge  Home Health: No  Equipment/Devices: No   Discharge Condition: Stable  CODE STATUS: Full  Diet recommendation: Heart Healthy / Carb Modified    Brief/Interim Summary: 83 yo female who presented with the chief complain of confusion, malaise and abdominal pain. Patient known to have insulin dependent diabetes mellitus and hypothyroidism. 3 days before admission her insulin regimen was modified, decreased dose of insulin, 24 hours later patient became hyperglycemic, came to the ED, glucose was stabilized and foley catheter was placed due to urinary retention and discharged home. The day of admission patient developed worsening confusion, pulled foley catheter, producing hematuria. On the initial examination, temperature was 100.5 with HR 120 and respiratory rate 38. Patient confused unable to follow commands, her lung were clear to auscultation, heart s1-s2 present and soft abdomen. Serum sodium 129, potassium 5.8, chloride 92, bicarbonate 17,  glucose 596 with anion gap of 20, white count 12.0, hemoglobin 11.7, hematocrit 35.2, platelets 206, lactic acid 3.9, urinalysis with too numerous to count RBCs, glucose greater than 500 with 100 protein. CT of the abdomen and pelvis showed moderate ascites with no signs of bowel obstruction, head CT with atrophy but no acute changes, chest x-ray negative for infiltrates, EKG normal sinus rhythm, LVH, normal axis. Normal intervals.    Patient was admitted with the working diagnosis of DKA complicated with sepsis.   1. Insulin-dependent diabetes mellitus, with diabetes ketoacidosis. Patient was admitted to the stepdown unit,  she was placed on insulin infusion intravenously with good toleration, the anion gap closed  fairly rapidly and she was successfully transitioned to subcutaneous insulin with good toleration. She is on 8 units twice daily of basal insulin Levemir and sliding scale for glucose coverage and monitoring. Capillary glucose 69, 81, 104, 153 over last 24 hours. Patient is tolerating well by mouth diet.   2. Sepsis due to suspected urinary tract infection, with possible relation to indwelling Foley catheter, present on admission. Patient was placed on ceftriaxone for antibiotic therapy with good toleration. White count improvement at discharge down to 8.9, cultures remain with no significant growth. Patient will be discharged on cephalexin for 3 more days. Patient did show urinary retention, Foley catheter was placed. Will do a voiding trial before discharge, if continue urinary retention, patient may be able to go home with a indwelling Foley catheter and close follow-up with urology clinic.  3. Acute kidney injury with hyperkalemia. Patient received IV fluids with good toleration, kidney function improved with discharge creatinine of 0.81, the peak serum creatinine was 1.57. Discharge potassium 3.5, chloride 106, serum bicarbonate 20.   4. Acute metabolic encephalopathy. Likely related to urinary tract infection, patient clinically improved, deemed to be stable to discharged on May 14. Physical therapy evaluation with recommendations to return home, no follow-up needed.  5. Hypothyroidism. Patient was continued on levothyroxine  6. Dyslipidemia. Continue simvastatin.  7. Hypertension. Losartan will be resumed, at discharge. Echocardiography showed ejection fraction 65-70% of left ventricle. Normal wall motion. Will recommend to hold on furosemide to avoid side effects.  Late entry: Patient was discharged at a later date, due to development of bowel obstruction, please refer to discharge summary from  12/09/16.  Discharge Diagnoses:  Principal Problem:   DKA (diabetic ketoacidoses) ([place]) Active Problems:   Hypothyroidism, acquired   Type 2 diabetes mellitus without complication, with long-term current use of insulin ([facility])   Essential hypertension   GERD   Constipation   Hyperlipidemia   Hyperkalemia   Sepsis ([place])   AKI (acute kidney injury) ([place])   Delirium   Acute urinary retention   Ascites   Abdominal distension   Facial droop    Discharge Instructions   Allergies as of 11/30/2016      Reactions   Lisinopril Cough   Reported by [facility]   Sulfa Antibiotics Hives   Sulfonamide Derivatives Hives   Bactrim [sulfamethoxazole-trimethoprim] Rash   Reported by [facility]      Medication List    STOP taking these medications   furosemide 20 MG tablet Commonly known as:  LASIX     TAKE these medications   acetaminophen 500 MG tablet Commonly known as:  TYLENOL Take 1 tablet (500 mg total) by mouth every 6 (six) hours as needed (pain). What changed:  how much to take   alendronate 70 MG tablet Commonly known as:  FOSAMAX Take 70 mg by mouth every 7 (seven) days.   aspirin EC 81 MG tablet Take 81 mg by mouth daily.   CALCIUM-D PO Take 2 tablets by mouth 2 (two) times daily.   cephALEXin 500 MG capsule Commonly known as:  KEFLEX Take 1 capsule (500 mg total) by mouth every 12 (twelve) hours.   cholecalciferol 1000 units tablet Commonly known as:  VITAMIN D Take 1,000 Units by mouth daily.   clindamycin 2 % vaginal cream Commonly known as:  CLEOCIN Place 1 Applicatorful vaginally at bedtime.   cyclobenzaprine 5 MG tablet Commonly known as:  FLEXERIL Take 5 mg by mouth 3 (three) times daily as needed for muscle spasms.   FISH OIL PO Take 1 capsule by mouth 2 (two) times daily.   insulin aspart 100 UNIT/ML injection Commonly known as:  novoLOG Inject 7-8 Units into the skin See admin instructions. Inject 7 units subcutaneously at  breakfast and lunch and 8 units at supper What changed:  Another medication with the same name was removed. Continue taking this medication, and follow the directions you see here.   Insulin Detemir 100 UNIT/ML Pen Commonly known as:  LEVEMIR FLEXTOUCH Inject 8 Units into the skin 2 (two) times daily. What changed:  Another medication with the same name was removed. Continue taking this medication, and follow the directions you see here.   iron polysaccharides 150 MG capsule Commonly known as:  NIFEREX Take 150 mg by mouth 2 (two) times daily.   levothyroxine 100 MCG tablet Commonly known as:  SYNTHROID, LEVOTHROID Take 1 tablet (100 mcg total) by mouth daily. What changed:  Another medication with the same name was removed. Continue taking this medication, and follow the directions you see here.   loratadine 10 MG tablet Commonly known as:  CLARITIN Take 10 mg by mouth every other day.   losartan 25 MG tablet Commonly known as:  COZAAR Take 1 tablet (25 mg total) by mouth daily. What changed:  Another medication with the same name was removed. Continue taking this medication, and follow the directions you see here.   meclizine 12.5 MG tablet Commonly known as:  ANTIVERT Take 1 tablet (12.5 mg total) by mouth 3 (three) times daily as needed for dizziness.   metFORMIN 1000 MG tablet Commonly known as:  GLUCOPHAGE  Take 1,000 mg by mouth 2 (two) times daily with a meal.   mometasone 50 MCG/ACT nasal spray Commonly known as:  NASONEX Place 2 sprays into the nose 2 (two) times daily as needed (seasonal allergies).   naproxen sodium 220 MG tablet Commonly known as:  ALEVE Take 2 tablets (440 mg total) by mouth 2 (two) times daily as needed (pain).   NONFORMULARY OR COMPOUNDED ITEM Estradiol 0.02 % 19ml prefilled applicator Sig: apply twice a week   nystatin 100000 UNIT/ML suspension Commonly known as:  MYCOSTATIN Take 5 mLs by mouth 2 (two) times daily. For mouth    ondansetron 4 MG disintegrating tablet Commonly known as:  ZOFRAN ODT Take 1 tablet (4 mg total) by mouth every 8 (eight) hours as needed for nausea or vomiting.   pantoprazole 40 MG tablet Commonly known as:  PROTONIX Take 1 tablet (40 mg total) by mouth daily. What changed:  Another medication with the same name was removed. Continue taking this medication, and follow the directions you see here.   polyethylene glycol packet Commonly known as:  MIRALAX / GLYCOLAX Take 17 g by mouth every Monday, Wednesday, and Friday. Mix in 8 oz liquid and drink   simvastatin 40 MG tablet Commonly known as:  ZOCOR TAKE 1 TABLET BY MOUTH EVERY EVENING. NEED OFFICE VISIT FOR MORE REFILLS What changed:  Another medication with the same name was removed. Continue taking this medication, and follow the directions you see here.       Allergies  Allergen Reactions  . Lisinopril Cough    Reported by [facility]  . Sulfa Antibiotics Hives  . Sulfonamide Derivatives Hives  . Bactrim [Sulfamethoxazole-Trimethoprim] Rash    Reported by [facility]    Consultations:     Procedures/Studies: Ct Head Wo Contrast  Result Date: 11/27/2016 CLINICAL DATA:  New facial droop. EXAM: CT HEAD WITHOUT CONTRAST TECHNIQUE: Contiguous axial images were obtained from the base of the skull through the vertex without intravenous contrast. COMPARISON:  Head CT 11/25/16 FINDINGS: Brain: No mass lesion, intraparenchymal hemorrhage or extra-axial collection. No evidence of acute cortical infarct. There is unchanged ventriculomegaly. There is periventricular hypoattenuation compatible with chronic microvascular disease. Vascular: No hyperdense vessel or unexpected calcification. Skull: Normal visualized skull base, calvarium and extracranial soft tissues. Sinuses/Orbits: No sinus fluid levels or advanced mucosal thickening. No mastoid effusion. Normal orbits. IMPRESSION: 1. No acute intracranial abnormality. 2.  Chronic ventriculomegaly and microvascular ischemia. Electronically Signed   By: Ulyses Jarred M.D.   On: 11/27/2016 18:29   Ct Head Wo Contrast  Result Date: 11/25/2016 CLINICAL DATA:  Altered mental status. EXAM: CT HEAD WITHOUT CONTRAST TECHNIQUE: Contiguous axial images were obtained from the base of the skull through the vertex without intravenous contrast. COMPARISON:  Head CT 11/03/2013 FINDINGS: Brain: No evidence of acute infarction, hemorrhage, hydrocephalus, extra-axial collection or mass lesion/mass effect. Stable degree of atrophy and chronic last. Vascular: Atherosclerosis of skullbase vasculature without hyperdense vessel or abnormal calcification. Skull: No fracture or focal lesion. Sinuses/Orbits: Minimal chronic opacification of lower left mastoid air cells. Right mastoid air cells are clear. Visualized orbits and paranasal sinuses are unremarkable. Other: None. IMPRESSION: Unchanged atrophy and chronic small vessel ischemia. No acute intracranial abnormality. Electronically Signed   By: Jeb Levering M.D.   On: 11/25/2016 19:34   Ct Abdomen Pelvis W Contrast  Result Date: 11/25/2016 CLINICAL DATA:  Abdominal pain, cramping, nausea, and vomiting for several hours. Elevated blood sugar. Elevated lipase. History of hypertension and  diabetes. EXAM: CT ABDOMEN AND PELVIS WITH CONTRAST TECHNIQUE: Multidetector CT imaging of the abdomen and pelvis was performed using the standard protocol following bolus administration of intravenous contrast. CONTRAST:  100 mL Isovue-300 COMPARISON:  12/04/2005 FINDINGS: Lower chest: Mild dependent changes in the lung bases. Distal esophageal wall thickening may indicate reflux disease. Hepatobiliary: There is a calcification in the porta hepatis which could represent a cystic duct stone. However, there is no evidence of gallbladder distention or inflammatory change. No bile duct dilatation. No focal liver lesions. Pancreas: Unremarkable. No pancreatic ductal  dilatation or surrounding inflammatory changes. Spleen: Normal in size without focal abnormality. Adrenals/Urinary Tract: No adrenal gland nodules. Small cyst on the left kidney. No hydronephrosis or hydroureter. Renal nephrograms are symmetrical. Bladder is unremarkable. Stomach/Bowel: The stomach is decompressed. Gastric wall appears somewhat thickened but this may just be due to underdistention. Gastritis is not excluded. Small bowel are decompressed. Diffusely stool-filled colon without abnormal distention or wall thickening. Appendix is normal. Vascular/Lymphatic: Aortic atherosclerosis. No enlarged abdominal or pelvic lymph nodes. Reproductive: Status post hysterectomy. No adnexal masses. Other: Diffuse free fluid throughout the abdomen and pelvis suggesting ascites. No free air. Abdominal wall musculature appears intact. Musculoskeletal: Degenerative changes in the spine. No destructive bone lesions. IMPRESSION: Moderate diffuse abdominal and pelvic ascites. Stomach is decompressed. Possible gastric wall thickening although this could be due to under distention. Can't exclude gastritis. No evidence of bowel obstruction. Electronically Signed   By: Lucienne Capers M.D.   On: 11/25/2016 03:48   US Abdomen Limited  Result Date: 11/27/2016 CLINICAL DATA:  Ascites EXAM: LIMITED ABDOMINAL ULTRASOUND TO ASSESS FOR ASCITES COMPARISON:  CT abdomen and pelvis Nov 25, 2016 FINDINGS: There is mild ascites throughout the abdomen. There are multiple loops of peristalsing bowel. IMPRESSION: Mild ascites. Electronically Signed   By: Lowella Grip III M.D.   On: 11/27/2016 11:10   Dg Chest Port 1 View  Result Date: 11/25/2016 CLINICAL DATA:  Acute onset of altered mental status. Urinary retention. Initial encounter. EXAM: PORTABLE CHEST 1 VIEW COMPARISON:  Chest radiograph performed 02/12/2016 FINDINGS: The lungs are well-aerated and clear. There is no evidence of focal opacification, pleural effusion or  pneumothorax. The cardiomediastinal silhouette is within normal limits. No acute osseous abnormalities are seen. Calcification overlying the right humeral head likely reflects calcific tendinitis. IMPRESSION: 1. No acute cardiopulmonary process seen. 2. Calcific tendinitis at the right rotator cuff. Electronically Signed   By: Garald Balding M.D.   On: 11/25/2016 18:42       Subjective: Patient feeling well, no nausea or vomiting, no chest pain or dyspnea, tolerating po well.   Discharge Exam: Vitals:   11/29/16 2023 11/30/16 0405  BP: (!) 154/59 (!) 153/71  Pulse: 81 83  Resp: 14 14  Temp: 98.6 F (37 C) 98.1 F (36.7 C)   Vitals:   11/29/16 0900 11/29/16 1107 11/29/16 2023 11/30/16 0405  BP:  (!) 149/75 (!) 154/59 (!) 153/71  Pulse:  85 81 83  Resp: (!) 22 16 14 14   Temp:  98.2 F (36.8 C) 98.6 F (37 C) 98.1 F (36.7 C)  TempSrc:  Oral Oral Oral  SpO2:  100% 99% 98%  Weight:  62.1 kg (137 lb)    Height:  5\' 7"  (1.702 m)      General: Pt is alert, awake, not in acute distress Oral mucosa moist, no JVD.  Cardiovascular: RRR, S1/S2 +, no rubs, no gallops Respiratory: CTA bilaterally, no wheezing, no rhonchi Abdominal: Soft,  NT, ND, bowel sounds +. Mild distended abdomen.  Extremities: no edema, no cyanosis    The results of significant diagnostics from this hospitalization (including imaging, microbiology, ancillary and laboratory) are listed below for reference.     Microbiology: Recent Results (from the past 240 hour(s))  Urine culture     Status: Abnormal   Collection Time: 11/25/16  1:17 AM  Result Value Ref Range Status   Specimen Description URINE, RANDOM  Final   Special Requests NONE  Final   Culture MULTIPLE SPECIES PRESENT, SUGGEST RECOLLECTION (A)  Final   Report Status 11/26/2016 FINAL  Final  Urine culture     Status: None   Collection Time: 11/25/16  3:07 PM  Result Value Ref Range Status   Specimen Description URINE, CLEAN CATCH  Final    Special Requests NONE  Final   Culture   Final    NO GROWTH Performed at [facility], [place] [address], [place], [place] [address]    Report Status 11/27/2016 FINAL  Final  Blood Culture (routine x 2)     Status: None (Preliminary result)   Collection Time: 11/25/16  6:49 PM  Result Value Ref Range Status   Specimen Description BLOOD LEFT HAND  Final   Special Requests IN PEDIATRIC BOTTLE Blood Culture adequate volume  Final   Culture   Final    NO GROWTH 4 DAYS Performed at [facility], [place] [address], [place], [place] [address]    Report Status PENDING  Incomplete  MRSA PCR Screening     Status: None   Collection Time: 11/26/16 12:23 AM  Result Value Ref Range Status   MRSA by PCR NEGATIVE NEGATIVE Final    Comment:        The GeneXpert MRSA Assay (FDA approved for NASAL specimens only), is one component of a comprehensive MRSA colonization surveillance program. It is not intended to diagnose MRSA infection nor to guide or monitor treatment for MRSA infections.      Labs: BNP (last 3 results) No results for input(s): BNP in the last 8760 hours. Basic Metabolic Panel:  Recent Labs Lab 11/26/16 0106 11/26/16 0511 11/26/16 1431 11/27/16 0320 11/28/16 0403 11/29/16 0404  NA 137 140 141 135 137 135  K 4.2 3.8 3.6 3.7 3.4* 3.5  CL 106 108 111 105 106 106  CO2 17* 24 24 24 23  20*  GLUCOSE 398* 138* 127* 195* 91 118*  BUN 40* 42* 31* 20 14 15   CREATININE 1.57* 1.40* 1.26* 0.88 0.79 0.81  CALCIUM 9.0 8.7* 8.4* 7.9* 8.5* 8.2*  MG 1.9  --   --   --   --   --   PHOS 2.8  --   --   --   --   --    Liver Function Tests:  Recent Labs Lab 11/24/16 2159 11/25/16 1506 11/28/16 0403  AST 28 27 19   ALT 29 29 16   ALKPHOS 62 71 53  BILITOT 0.5 1.1 0.6  PROT 7.5 7.4 6.0*  ALBUMIN 4.4 4.2 3.1*    Recent Labs Lab 11/24/16 2159 11/26/16 0511  LIPASE 77* 22    Recent Labs Lab 11/25/16 1608  AMMONIA 14   CBC:  Recent Labs Lab 11/24/16 2159  11/25/16 1506 11/27/16 0320 11/28/16 0403 11/29/16 0404  WBC 7.2 12.0* 8.8 9.1 8.9  NEUTROABS  --   --  6.9 7.4 7.4  HGB 10.1* 11.7* 10.0* 12.5 12.3  HCT 30.8* 35.2* 30.1* 36.6 36.5  MCV 91.7 92.9 90.7 89.9 90.6  PLT 289 286 253 284 286   Cardiac Enzymes:  Recent Labs Lab 11/26/16 0106  TROPONINI 0.44*   BNP: Invalid input(s): POCBNP CBG:  Recent Labs Lab 11/29/16 2348 11/30/16 0403 11/30/16 0431 11/30/16 0713 11/30/16 1134  GLUCAP 77 69 81 104* 153*   D-Dimer No results for input(s): DDIMER in the last 72 hours. Hgb A1c No results for input(s): HGBA1C in the last 72 hours. Lipid Profile No results for input(s): CHOL, HDL, LDLCALC, TRIG, CHOLHDL, LDLDIRECT in the last 72 hours. Thyroid function studies No results for input(s): TSH, T4TOTAL, T3FREE, THYROIDAB in the last 72 hours.  Invalid input(s): FREET3 Anemia work up No results for input(s): VITAMINB12, FOLATE, FERRITIN, TIBC, IRON, RETICCTPCT in the last 72 hours. Urinalysis    Component Value Date/Time   COLORURINE RED (A) 11/25/2016 1507   APPEARANCEUR HAZY (A) 11/25/2016 1507   LABSPEC 1.024 11/25/2016 1507   PHURINE 5.0 11/25/2016 1507   GLUCOSEU >=500 (A) 11/25/2016 1507   HGBUR LARGE (A) 11/25/2016 1507   BILIRUBINUR NEGATIVE 11/25/2016 1507   BILIRUBINUR neg 05/06/2015 1421   KETONESUR 20 (A) 11/25/2016 1507   PROTEINUR 100 (A) 11/25/2016 1507   UROBILINOGEN 1.0 05/06/2015 1421   UROBILINOGEN 0.2 12/25/2014 1206   NITRITE NEGATIVE 11/25/2016 1507   LEUKOCYTESUR NEGATIVE 11/25/2016 1507   Sepsis Labs Invalid input(s): PROCALCITONIN,  WBC,  LACTICIDVEN Microbiology Recent Results (from the past 240 hour(s))  Urine culture     Status: Abnormal   Collection Time: 11/25/16  1:17 AM  Result Value Ref Range Status   Specimen Description URINE, RANDOM  Final   Special Requests NONE  Final   Culture MULTIPLE SPECIES PRESENT, SUGGEST RECOLLECTION (A)  Final   Report Status 11/26/2016 FINAL   Final  Urine culture     Status: None   Collection Time: 11/25/16  3:07 PM  Result Value Ref Range Status   Specimen Description URINE, CLEAN CATCH  Final   Special Requests NONE  Final   Culture   Final    NO GROWTH Performed at [facility], [place] [address], [place], [place] [address]    Report Status 11/27/2016 FINAL  Final  Blood Culture (routine x 2)     Status: None (Preliminary result)   Collection Time: 11/25/16  6:49 PM  Result Value Ref Range Status   Specimen Description BLOOD LEFT HAND  Final   Special Requests IN PEDIATRIC BOTTLE Blood Culture adequate volume  Final   Culture   Final    NO GROWTH 4 DAYS Performed at [facility], [place] [address], [place], [place] [address]    Report Status PENDING  Incomplete  MRSA PCR Screening     Status: None   Collection Time: 11/26/16 12:23 AM  Result Value Ref Range Status   MRSA by PCR NEGATIVE NEGATIVE Final    Comment:        The GeneXpert MRSA Assay (FDA approved for NASAL specimens only), is one component of a comprehensive MRSA colonization surveillance program. It is not intended to diagnose MRSA infection nor to guide or monitor treatment for MRSA infections.      Time coordinating discharge: 45 minutes  SIGNED:   Tawni Millers, MD  [facility] 11/30/2016, 11:53 AM Pager   If 7PM-7AM, please contact night-coverage [email or web address] Password [email or web address]

## 2016-11-30 NOTE — Evaluation (Signed)
Occupational Therapy Evaluation Patient Details Name: [name] [name] MRN: [id number] DOB: May 25, 1934 Today's Date: 11/30/2016    History of Present Illness  83 y.o. female with a past medical history significant for IDDM, hypothyroidism and HTN who presents with 2 days abdominal pain, malaise and now confusion.   Clinical Impression   Pt admitted with abdominal pain. Malaise and confusion. Pt currently with functional limitations due to the deficits listed below (see OT Problem List). Pt will benefit from skilled OT to increase their safety and independence with ADL and functional mobility for ADL to facilitate discharge to venue listed below.    Follow Up Recommendations  Home health OT;Supervision/Assistance - 24 hour    Equipment Recommendations  3 in 1 bedside commode    Recommendations for Other Services       Precautions / Restrictions Precautions Precautions: Fall Precaution Comments: h/o 1 fall in past 1 year Restrictions Weight Bearing Restrictions: No      Mobility Bed Mobility Overal bed mobility: Needs Assistance Bed Mobility: Supine to Sit;Sit to Supine     Supine to sit: Min assist Sit to supine: Min assist      Transfers Overall transfer level: Needs assistance   Transfers: Sit to/from Stand Sit to Stand: Min assist              Balance                                           ADL either performed or assessed with clinical judgement   ADL Overall ADL's : Needs assistance/impaired Eating/Feeding: Minimal assistance;Sitting Eating/Feeding Details (indicate cue type and reason): Pt sat up EOB for lunch and pt needed to return to supine after sitting EOB for 10 min.   Grooming: Sitting;Set up                                 General ADL Comments: OT encouraged to get OOB and pt declined to get to chair.  RN aware.  Pt lives alone and would need A upon DC.  Do not feel pt would be safe to live alone      Vision Patient Visual Report: No change from baseline       Perception     Praxis      Pertinent Vitals/Pain Pain Score: 4  Pain Location: abdomen Pain Descriptors / Indicators: Sore Pain Intervention(s): Repositioned;Monitored during session;Limited activity within patient's tolerance     Hand Dominance     Extremity/Trunk Assessment Upper Extremity Assessment Upper Extremity Assessment: Generalized weakness           Communication Communication Communication: HOH   Cognition Arousal/Alertness: Awake/alert Behavior During Therapy: WFL for tasks assessed/performed Overall Cognitive Status: Within Functional Limits for tasks assessed                                                Home Living Family/patient expects to be discharged to:: Private residence Living Arrangements: Alone Available Help at Discharge: Available PRN/intermittently;Family Type of Home: House Home Access: Stairs to enter [employer] of Steps: 3 Entrance Stairs-Rails: None Home Layout: One level     Bathroom Shower/Tub: Tub/shower unit   [employer]  Toilet: Standard     Home Equipment: Walker - 4 wheels;Cane - single point;Shower seat          Prior Functioning/Environment Level of Independence: Independent with assistive device(s)        Comments: drives, independent bathing in shower, walks with cane        OT Problem List: Decreased strength;Decreased activity tolerance;Decreased knowledge of use of DME or AE;Decreased safety awareness      OT Treatment/Interventions: Self-care/ADL training;DME and/or AE instruction;Patient/family education    OT Goals(Current goals can be found in the care plan section) Acute Rehab OT Goals Patient Stated Goal: return home OT Goal Formulation: With patient Time For Goal Achievement: 12/14/16 Potential to Achieve Goals: Good  OT Frequency: Min 2X/week              AM-PAC PT "6 Clicks" Daily  Activity     Outcome Measure Help from another person eating meals?: A Little Help from another person taking care of personal grooming?: A Little Help from another person toileting, which includes using toliet, bedpan, or urinal?: A Little Help from another person bathing (including washing, rinsing, drying)?: A Little Help from another person to put on and taking off regular upper body clothing?: A Little Help from another person to put on and taking off regular lower body clothing?: A Little 6 Click Score: 18   End of Session Nurse Communication: Mobility status  Activity Tolerance: Patient tolerated treatment well Patient left: in bed;with call bell/phone within reach;with nursing/sitter in room  OT Visit Diagnosis: Muscle weakness (generalized) (M62.81)                Time: 2633-3545 OT Time Calculation (min): 23 min Charges:  OT General Charges $OT Visit: 1 Procedure OT Evaluation $OT Eval Moderate Complexity: 1 Procedure OT Treatments $Self Care/Home Management : 8-22 mins G-Codes:     Kari Baars, OT [phone]  Payton Mccallum D 11/30/2016, 2:08 PM

## 2016-12-01 ENCOUNTER — Inpatient Hospital Stay (HOSPITAL_COMMUNITY): Payer: Medicare HMO

## 2016-12-01 LAB — BASIC METABOLIC PANEL
ANION GAP: 12 (ref 5–15)
BUN: 10 mg/dL (ref 6–20)
CO2: 24 mmol/L (ref 22–32)
Calcium: 9.2 mg/dL (ref 8.9–10.3)
Chloride: 92 mmol/L — ABNORMAL LOW (ref 101–111)
Creatinine, Ser: 0.76 mg/dL (ref 0.44–1.00)
GFR calc Af Amer: >60 mL/min (ref >60)
GFR calc non Af Amer: >60 mL/min (ref >60)
GLUCOSE: 164 mg/dL — AB (ref 65–99)
POTASSIUM: 3.5 mmol/L (ref 3.5–5.1)
Sodium: 128 mmol/L — ABNORMAL LOW (ref 135–145)

## 2016-12-01 LAB — CBC
HEMATOCRIT: 36.1 % (ref 36.0–46.0)
Hemoglobin: 12.2 g/dL (ref 12.0–15.0)
MCH: 30.6 pg (ref 26.0–34.0)
MCHC: 33.8 g/dL (ref 30.0–36.0)
MCV: 90.5 fL (ref 78.0–100.0)
Platelets: 354 10*3/uL (ref 150–400)
RBC: 3.99 MIL/uL (ref 3.87–5.11)
RDW: 13.6 % (ref 11.5–15.5)
WBC: 9.8 10*3/uL (ref 4.0–10.5)

## 2016-12-01 LAB — GLUCOSE, CAPILLARY
GLUCOSE-CAPILLARY: 82 mg/dL (ref 65–99)
GLUCOSE-CAPILLARY: 142 mg/dL — AB (ref 65–99)
GLUCOSE-CAPILLARY: 125 mg/dL — AB (ref 65–99)
Glucose-Capillary: 73 mg/dL (ref 65–99)
Glucose-Capillary: 76 mg/dL (ref 65–99)
Glucose-Capillary: 59 mg/dL — ABNORMAL LOW (ref 65–99)
Glucose-Capillary: 123 mg/dL — ABNORMAL HIGH (ref 65–99)

## 2016-12-01 MED ORDER — PANTOPRAZOLE SODIUM 40 MG IV SOLR
40.0000 mg | INTRAVENOUS | Status: DC
  Administered 2016-12-01 – 2016-12-02 (×2): 40 mg via INTRAVENOUS
  Filled 2016-12-01 (×2): qty 40

## 2016-12-01 MED ORDER — IOPAMIDOL (ISOVUE-300) INJECTION 61%
INTRAVENOUS | Status: AC
Start: 2016-12-01 — End: 2016-12-01
  Administered 2016-12-01: 100 mL
  Filled 2016-12-01: qty 100

## 2016-12-01 MED ORDER — LEVOTHYROXINE SODIUM 100 MCG IV SOLR
50.0000 ug | Freq: Every day | INTRAVENOUS | Status: DC
  Administered 2016-12-02 – 2016-12-03 (×2): 50 ug via INTRAVENOUS
  Filled 2016-12-01 (×2): qty 5

## 2016-12-01 MED ORDER — INSULIN DETEMIR 100 UNIT/ML ~~LOC~~ SOLN
4.0000 [IU] | Freq: Two times a day (BID) | SUBCUTANEOUS | Status: DC
  Administered 2016-12-01 – 2016-12-03 (×4): 4 [IU] via SUBCUTANEOUS
  Filled 2016-12-01 (×6): qty 0.04

## 2016-12-01 MED ORDER — DEXTROSE 5 % IV SOLN
1.0000 g | INTRAVENOUS | Status: DC
  Administered 2016-12-01 – 2016-12-03 (×3): 1 g via INTRAVENOUS
  Filled 2016-12-01 (×3): qty 10

## 2016-12-01 MED ORDER — DEXTROSE 50 % IV SOLN
INTRAVENOUS | Status: AC
  Administered 2016-12-01: 50 mL
  Filled 2016-12-01: qty 50

## 2016-12-01 MED ORDER — DEXTROSE-NACL 5-0.45 % IV SOLN
INTRAVENOUS | Status: DC
  Administered 2016-12-01 – 2016-12-02 (×3): via INTRAVENOUS

## 2016-12-01 MED ORDER — IOPAMIDOL (ISOVUE-300) INJECTION 61%
15.0000 mL | Freq: Once | INTRAVENOUS | Status: DC | PRN
  Administered 2016-12-01: 15 mL via ORAL
  Filled 2016-12-01: qty 30

## 2016-12-01 MED ORDER — IOPAMIDOL (ISOVUE-300) INJECTION 61%
INTRAVENOUS | Status: AC
Start: 2016-12-01 — End: 2016-12-01
  Administered 2016-12-01: 17:00:00
  Filled 2016-12-01: qty 30

## 2016-12-01 NOTE — Progress Notes (Signed)
PROGRESS NOTE    [name] [name]  FTD:[id number] DOB: February 10, 1934 DOA: 11/25/2016 PCP: Aura Dials, MD    Brief Narrative:  83 yo female who presented with the chief complain of confusion, malaise and abdominal pain. Patient known to have insulin dependent diabetes mellitus and hypothyroidism. 3 days before admission her insulin regimen was modified, decreased dose of insulin, 24 hours later patient became hyperglycemic, came to the ED, glucose was stabilized and foley catheter was placed due to urinary retention and discharged home. The day of admission patient developed worsening confusion, pulled foley catheter, producing hematuria. On the initial examination, temperature was 100.5 with HR 120 and respiratory rate 38. Patient confused unable to follow commands, her lung were clear to auscultation, heart s1-s2 present and soft abdomen. Serum glucose 596 with anion gap of 20. Patient was admitted with the working diagnosis of DKA complicated with sepsis. Patient responding to insulin regimen and antibiotic therapy. Positive urinary retention, foley catheter placed. Transferred to medical unit (05/13). Patient with persistent urinary retention, failed voiding trial. Discharge cancelled due to ileus/ bowel obstruction.    Assessment & Plan:   Principal Problem:   DKA (diabetic ketoacidoses) (Early) Active Problems:   Hypothyroidism, acquired   Type 2 diabetes mellitus without complication, with long-term current use of insulin (HCC)   Essential hypertension   GERD   Constipation   Hyperlipidemia   Hyperkalemia   Sepsis ([place])   AKI (acute kidney injury) ([place])   Delirium   Acute urinary retention   Ascites   Abdominal distension   Facial droop   1. Small bowel obstruction. NEW. Patient with worsening distention, not passing gas or stools, abdominal films with air fluid levels and suggestive bowel obstruction, CT abdomen confirming complete obstruction. Will place NG tube to low  intermittent suction, surgical team has been consulted.   2. Sepsis due to suspected urine infection, to rule out related toindwelling foley catheter, present on admission. Antibiotic therapy with ceftriaxone #5. No leukocytosis, patient has remained afebrile, cultures no growth, will plan to complete 7 days of therapy.   3. AKI with hyperkalemia. Renal function with cr at 0.81 with K at 3,5, will continue hydration with IV fluids, patient is NPO.   3. Metabolic encephalopathy. No confusion or agitation, patient disorientated.  Non focal.   4. Hypothyroidism. Continue with levothyroxine in the IV form due to NPO status. .  5. Dyslipidemia. Holding simvastatin, due to npo status. .   6. T2DM. Capillary glucose 82, 73, 76. Will continue sliding scale for glucose cover and monitoring, will decrease basal regimen to 4 units bid and will change fluids to d51/2 saline, note patient presented with DKA on admission.  7. HTN. Holding po blood pressure agents, will use as needed hydralazine. Systolic blood pressure 062 to 170.   DVT prophylaxis:enoxaparin  Code Status:Full  Family Communication: Disposition Plan:Home    Consultants:    Procedures:    Antimicrobials:   Ceftriaxone.    Subjective: Patient with persistent abdominal pain and distention, no nausea or vomiting. Abdominal pain on deep inspiration. Foley catheter in place. Abdominal films suggestive obstruction or ileus. Not passing gas or stools.   Objective: Vitals:   11/30/16 0405 11/30/16 1455 11/30/16 2138 12/01/16 0449  BP: (!) 153/71 (!) 170/75 (!) 158/64 (!) 160/60  Pulse: 83 85 92 85  Resp: 14 18 18 18   Temp: 98.1 F (36.7 C) 98.2 F (36.8 C) 98.6 F (37 C) 98 F (36.7 C)  TempSrc: Oral Oral Oral  Oral  SpO2: 98% 98% 98% 98%  Weight:      Height:        Intake/Output Summary (Last 24 hours) at 12/01/16 0956 Last data filed at 12/01/16 0908  Gross per 24 hour  Intake                0  ml  Output             1050 ml  Net            -1050 ml   Filed Weights   11/25/16 1757 11/26/16 0017 11/29/16 1107  Weight: 63.5 kg (140 lb) 61.1 kg (134 lb 11.2 oz) 62.1 kg (137 lb)    Examination:  General exam: deconditioned, not in pain or dyspnea E ENT: no pallor or icterus, oral mucosa moist.  Respiratory system: Clear to auscultation. Respiratory effort normal. No wheezing, rales or rhochi.  Cardiovascular system: S1 & S2 heard, RRR. No JVD, murmurs, rubs, gallops or clicks. No pedal edema. Gastrointestinal system: Abdomen is nondistended, soft and nontender. No organomegaly or masses felt. Normal bowel sounds heard. Central nervous system: Alert and oriented. No focal neurological deficits. Extremities: Symmetric 5 x 5 power. Skin: No rashes, lesions or ulcers     Data Reviewed: I have personally reviewed following labs and imaging studies  CBC:  Recent Labs Lab 11/24/16 2159 11/25/16 1506 11/27/16 0320 11/28/16 0403 11/29/16 0404  WBC 7.2 12.0* 8.8 9.1 8.9  NEUTROABS  --   --  6.9 7.4 7.4  HGB 10.1* 11.7* 10.0* 12.5 12.3  HCT 30.8* 35.2* 30.1* 36.6 36.5  MCV 91.7 92.9 90.7 89.9 90.6  PLT 289 286 253 284 007   Basic Metabolic Panel:  Recent Labs Lab 11/26/16 0106 11/26/16 0511 11/26/16 1431 11/27/16 0320 11/28/16 0403 11/29/16 0404  NA 137 140 141 135 137 135  K 4.2 3.8 3.6 3.7 3.4* 3.5  CL 106 108 111 105 106 106  CO2 17* 24 24 24 23  20*  GLUCOSE 398* 138* 127* 195* 91 118*  BUN 40* 42* 31* 20 14 15   CREATININE 1.57* 1.40* 1.26* 0.88 0.79 0.81  CALCIUM 9.0 8.7* 8.4* 7.9* 8.5* 8.2*  MG 1.9  --   --   --   --   --   PHOS 2.8  --   --   --   --   --    GFR: Estimated Creatinine Clearance: 53.9 mL/min (by C-G formula based on SCr of 0.81 mg/dL). Liver Function Tests:  Recent Labs Lab 11/24/16 2159 11/25/16 1506 11/28/16 0403  AST 28 27 19   ALT 29 29 16   ALKPHOS 62 71 53  BILITOT 0.5 1.1 0.6  PROT 7.5 7.4 6.0*  ALBUMIN 4.4 4.2 3.1*     Recent Labs Lab 11/24/16 2159 11/26/16 0511  LIPASE 77* 22    Recent Labs Lab 11/25/16 1608  AMMONIA 14   Coagulation Profile: No results for input(s): INR, PROTIME in the last 168 hours. Cardiac Enzymes:  Recent Labs Lab 11/26/16 0106  TROPONINI 0.44*   BNP (last 3 results) No results for input(s): PROBNP in the last 8760 hours. HbA1C: No results for input(s): HGBA1C in the last 72 hours. CBG:  Recent Labs Lab 11/30/16 2119 11/30/16 2206 11/30/16 2354 12/01/16 0441 12/01/16 0752  GLUCAP 149* 128* 127* 82 73   Lipid Profile: No results for input(s): CHOL, HDL, LDLCALC, TRIG, CHOLHDL, LDLDIRECT in the last 72 hours. Thyroid Function Tests: No results for input(s): TSH, T4TOTAL,  FREET4, T3FREE, THYROIDAB in the last 72 hours. Anemia Panel: No results for input(s): VITAMINB12, FOLATE, FERRITIN, TIBC, IRON, RETICCTPCT in the last 72 hours. Sepsis Labs:  Recent Labs Lab 11/25/16 1708 11/25/16 2143 11/26/16 0106 11/26/16 0511 11/27/16 0320 11/29/16 0404  PROCALCITON  --   --  2.29  --  1.74 0.57  LATICACIDVEN 3.39* 2.94* 3.4* 2.0*  --   --     Recent Results (from the past 240 hour(s))  Urine culture     Status: Abnormal   Collection Time: 11/25/16  1:17 AM  Result Value Ref Range Status   Specimen Description URINE, RANDOM  Final   Special Requests NONE  Final   Culture MULTIPLE SPECIES PRESENT, SUGGEST RECOLLECTION (A)  Final   Report Status 11/26/2016 FINAL  Final  Urine culture     Status: None   Collection Time: 11/25/16  3:07 PM  Result Value Ref Range Status   Specimen Description URINE, CLEAN CATCH  Final   Special Requests NONE  Final   Culture   Final    NO GROWTH Performed at [facility], [place] [address], [place], [place] [address]    Report Status 11/27/2016 FINAL  Final  Blood Culture (routine x 2)     Status: None   Collection Time: 11/25/16  6:49 PM  Result Value Ref Range Status   Specimen Description BLOOD LEFT HAND   Final   Special Requests IN PEDIATRIC BOTTLE Blood Culture adequate volume  Final   Culture   Final    NO GROWTH 5 DAYS Performed at [facility], [place] [address] [place], [place] [address]    Report Status 11/30/2016 FINAL  Final  MRSA PCR Screening     Status: None   Collection Time: 11/26/16 12:23 AM  Result Value Ref Range Status   MRSA by PCR NEGATIVE NEGATIVE Final    Comment:        The GeneXpert MRSA Assay (FDA approved for NASAL specimens only), is one component of a comprehensive MRSA colonization surveillance program. It is not intended to diagnose MRSA infection nor to guide or monitor treatment for MRSA infections.          Radiology Studies: Dg Abd 2 Views  Result Date: 11/30/2016 CLINICAL DATA:  Weakness and diffuse abdominal pain, continuing this admission. EXAM: ABDOMEN - 2 VIEW COMPARISON:  CT of the abdomen and pelvis 11/25/2016 FINDINGS: There has been interval development of significant small bowel dilatation. No evidence for free intraperitoneal air. Air-fluid levels are identified within the small bowel loops. There is paucity of large bowel gas. IMPRESSION: Interval development of small bowel obstruction. Electronically Signed   By: Nolon Nations M.D.   On: 11/30/2016 19:56        Scheduled Meds: . amLODipine  5 mg Oral Daily  . atorvastatin  20 mg Oral q1800  . cephALEXin  500 mg Oral Q12H  . enoxaparin (LOVENOX) injection  40 mg Subcutaneous Q24H  . insulin aspart  0-9 Units Subcutaneous Q4H  . insulin detemir  8 Units Subcutaneous BID  . levothyroxine  100 mcg Oral QAC breakfast  . pantoprazole  40 mg Oral Daily   Continuous Infusions: . sodium chloride 75 mL/hr at 12/01/16 0932     LOS: 6 days        Heaven Meeker Gerome Apley, MD [facility] Pager [phone]  If 7PM-7AM, please contact night-coverage [email or web address] Password [facility] 12/01/2016, 9:56 AM

## 2016-12-01 NOTE — Progress Notes (Signed)
PT Cancellation Note  Patient Details Name: [name] [name] MRN: [id number] DOB: [birth year]   Cancelled Treatment:     will hold off this morning as BP 170/67 and RN in room giving meds(BP med).  Will check back later today   Rica Koyanagi  PTA [facility]  Acute  Rehab Pager      [phone]

## 2016-12-01 NOTE — Progress Notes (Signed)
Hypoglycemic Event  CBG: 59  Treatment: D50 IV 50 mL  Symptoms: None  Follow-up CBG: Time1730 CBG Result:123  Possible Reasons for Event: Inadequate meal intake  Comments/MD notified:Dr Arrien on unit and made aware    Angelita Ingles

## 2016-12-01 NOTE — Progress Notes (Signed)
PT Cancellation Note  Patient Details Name: [name] [name] MRN: [id number] DOB: October 12, 1933   Cancelled Treatment:     returned in pm to attempt to tx however pt currently trying to drink a lot of contrast for her ABD CT.                                                   Rica Koyanagi  PTA [facility]  Acute  Rehab Pager      [phone]

## 2016-12-01 NOTE — Consult Note (Signed)
[facility] Surgery Consult Note  TAVONNA WORTHINGTON 05-Oct-1933  [id number].    Requesting MD: Arrien Chief Complaint/Reason for Consult: SBO  HPI:  [name] [name] is an 83yo female admitted to [facility] 11/25/16 with DKA and sepsis 2/2 UTI. At the time of admission she had a CT of the abdomen and pelvis showed moderate ascites with no signs of bowel obstruction. Patient was admitted and both of these issues resolved. Unfortunately Ms. Plyler then developed worsening abdominal distention and she was not passing gas or stools. CT abdomen 5/15 confirmed SBO and was concerning for a closed loop obstruction.  No family currently at bedside and patient is not a great historian. She does state that she has moderate global abdominal pain that is constant. States that she has had nausea and vomiting since yesterday. No flatus or BM today. Last documented BM 5/12. Per RN patient was on a heart healthy diet until last night, but she had poor PO intake x2 days due to early satiety.   PMH significant for IDDM, HTN, HLD, hypothyroidism Abdominal surgical history: hysterectomy Prior to admission patient lived at [place]: Review of Systems  Constitutional: Negative for chills and fever.  HENT: Negative.   Eyes: Negative.   Respiratory: Negative.   Cardiovascular: Negative.   Gastrointestinal: Positive for abdominal pain, constipation, nausea and vomiting. Negative for blood in stool, diarrhea, heartburn and melena.  Genitourinary: Negative.   Musculoskeletal: Negative.   Skin: Negative.   Neurological: Positive for weakness.  All systems reviewed and otherwise negative except for as above  Family History  Problem Relation Age of Onset  . Diabetes Mother   . Hypertension Mother   . Diabetes Sister   . Diabetes Brother     Past Medical History:  Diagnosis Date  . Arthritis    "knees, legs" (06/10/2016)  . Ascites   . Chronic kidney disease    "related to my diabetes"  . Chronic  lower back pain   . Diabetes mellitus without complication ([place])   . GERD (gastroesophageal reflux disease)   . High cholesterol   . Hyperlipidemia   . Hypertension   . Hypothyroidism   . OAB (overactive bladder)   . Osteoporosis   . Thyroid disease   . Type II diabetes mellitus ([place])     Past Surgical History:  Procedure Laterality Date  . ABDOMINAL HYSTERECTOMY    . KNEE ARTHROSCOPY    . vocal cord polyps      Social History:  reports that she has never smoked. She quit smokeless tobacco use about 33 years ago. Her smokeless tobacco use included Chew. She reports that she does not drink alcohol or use drugs.  Allergies:  Allergies  Allergen Reactions  . Lisinopril Cough    Reported by [facility]  . Sulfa Antibiotics Hives  . Sulfonamide Derivatives Hives  . Bactrim [Sulfamethoxazole-Trimethoprim] Rash    Reported by [facility]    Medications Prior to Admission  Medication Sig Dispense Refill  . alendronate (FOSAMAX) 70 MG tablet Take 70 mg by mouth every 7 (seven) days.     Marland Kitchen aspirin EC 81 MG tablet Take 81 mg by mouth daily.    . Calcium Carbonate-Vitamin D (CALCIUM-D PO) Take 2 tablets by mouth 2 (two) times daily.    . cholecalciferol (VITAMIN D) 1000 UNITS tablet Take 1,000 Units by mouth daily.    . clindamycin (CLEOCIN) 2 % vaginal cream Place 1 Applicatorful vaginally at bedtime. 40 g 0  .  cyclobenzaprine (FLEXERIL) 5 MG tablet Take 5 mg by mouth 3 (three) times daily as needed for muscle spasms.    . furosemide (LASIX) 20 MG tablet Take 20 mg by mouth every other day. Monday, Wednesday, and Friday  6  . insulin aspart (NOVOLOG) 100 UNIT/ML injection Inject 6 Units into the skin 3 (three) times daily before meals. 10 mL 5  . insulin aspart (NOVOLOG) 100 UNIT/ML injection Inject 7-8 Units into the skin See admin instructions. Inject 7 units subcutaneously at breakfast and lunch and 8 units at supper    . Insulin Detemir (LEVEMIR FLEXTOUCH) 100 UNIT/ML  Pen Inject 8 Units into the skin 2 (two) times daily. 15 mL 11  . Insulin Detemir (LEVEMIR FLEXTOUCH) 100 UNIT/ML Pen Inject 6-7 Units into the skin See admin instructions. Inject 7 units subcutaneously every morning and 6 units at night    . iron polysaccharides (NIFEREX) 150 MG capsule Take 150 mg by mouth 2 (two) times daily.     Marland Kitchen levothyroxine (SYNTHROID, LEVOTHROID) 100 MCG tablet Take 1 tablet (100 mcg total) by mouth daily. 30 tablet 3  . levothyroxine (SYNTHROID, LEVOTHROID) 100 MCG tablet Take 100 mcg by mouth daily before breakfast.    . loratadine (CLARITIN) 10 MG tablet Take 10 mg by mouth every other day.     . losartan (COZAAR) 25 MG tablet Take 1 tablet (25 mg total) by mouth daily. 30 tablet 3  . losartan (COZAAR) 25 MG tablet Take 25 mg by mouth daily.    . meclizine (ANTIVERT) 12.5 MG tablet Take 1 tablet (12.5 mg total) by mouth 3 (three) times daily as needed for dizziness. 30 tablet 0  . metFORMIN (GLUCOPHAGE) 1000 MG tablet Take 1,000 mg by mouth 2 (two) times daily with a meal.    . mometasone (NASONEX) 50 MCG/ACT nasal spray Place 2 sprays into the nose 2 (two) times daily as needed (seasonal allergies).    . NONFORMULARY OR COMPOUNDED ITEM Estradiol 0.02 % 7ml prefilled applicator Sig: apply twice a week 24 each 4  . nystatin (MYCOSTATIN) 100000 UNIT/ML suspension Take 5 mLs by mouth 2 (two) times daily. For mouth    . Omega-3 Fatty Acids (FISH OIL PO) Take 1 capsule by mouth 2 (two) times daily.    . ondansetron (ZOFRAN ODT) 4 MG disintegrating tablet Take 1 tablet (4 mg total) by mouth every 8 (eight) hours as needed for nausea or vomiting. 6 tablet 0  . pantoprazole (PROTONIX) 40 MG tablet Take 1 tablet (40 mg total) by mouth daily. 30 tablet 3  . pantoprazole (PROTONIX) 40 MG tablet Take 40 mg by mouth daily.    . polyethylene glycol (MIRALAX / GLYCOLAX) packet Take 17 g by mouth every Monday, Wednesday, and Friday. Mix in 8 oz liquid and drink    . simvastatin  (ZOCOR) 40 MG tablet TAKE 1 TABLET BY MOUTH EVERY EVENING. NEED OFFICE VISIT FOR MORE REFILLS 30 tablet 2  . simvastatin (ZOCOR) 40 MG tablet Take 40 mg by mouth every evening.    . [DISCONTINUED] acetaminophen (TYLENOL) 500 MG tablet Take 1,000 mg by mouth every 6 (six) hours as needed (pain).    . [DISCONTINUED] naproxen sodium (ALEVE) 220 MG tablet Take 1 tablet (220 mg total) by mouth 2 (two) times daily with a meal. (Patient taking differently: Take 440 mg by mouth 2 (two) times daily as needed (pain). ) 30 tablet 1    Prior to Admission medications   Medication Sig Start Date  End Date Taking? Authorizing Provider  alendronate (FOSAMAX) 70 MG tablet Take 70 mg by mouth every 7 (seven) days.  03/11/13  Yes [provider]  aspirin EC 81 MG tablet Take 81 mg by mouth daily.   Yes [provider]  Calcium Carbonate-Vitamin D (CALCIUM-D PO) Take 2 tablets by mouth 2 (two) times daily.   Yes [provider]  cholecalciferol (VITAMIN D) 1000 UNITS tablet Take 1,000 Units by mouth daily.   Yes [provider]  clindamycin (CLEOCIN) 2 % vaginal cream Place 1 Applicatorful vaginally at bedtime. 10/01/16  Yes Terrance Mass, MD  cyclobenzaprine (FLEXERIL) 5 MG tablet Take 5 mg by mouth 3 (three) times daily as needed for muscle spasms.   Yes [provider]  furosemide (LASIX) 20 MG tablet Take 20 mg by mouth every other day. Monday, Wednesday, and Friday 10/26/14  Yes [provider]  insulin aspart (NOVOLOG) 100 UNIT/ML injection Inject 6 Units into the skin 3 (three) times daily before meals. 05/06/15  Yes Funches, Josalyn, MD  insulin aspart (NOVOLOG) 100 UNIT/ML injection Inject 7-8 Units into the skin See admin instructions. Inject 7 units subcutaneously at breakfast and lunch and 8 units at supper   Yes [provider]  Insulin Detemir (LEVEMIR FLEXTOUCH) 100 UNIT/ML Pen Inject 8 Units into the skin 2 (two) times daily. 05/06/15  Yes  Funches, Josalyn, MD  Insulin Detemir (LEVEMIR FLEXTOUCH) 100 UNIT/ML Pen Inject 6-7 Units into the skin See admin instructions. Inject 7 units subcutaneously every morning and 6 units at night   Yes [provider]  iron polysaccharides (NIFEREX) 150 MG capsule Take 150 mg by mouth 2 (two) times daily.    Yes [provider]  levothyroxine (SYNTHROID, LEVOTHROID) 100 MCG tablet Take 1 tablet (100 mcg total) by mouth daily. 09/25/14  Yes Advani, Vernon Prey, MD  levothyroxine (SYNTHROID, LEVOTHROID) 100 MCG tablet Take 100 mcg by mouth daily before breakfast.   Yes [provider]  loratadine (CLARITIN) 10 MG tablet Take 10 mg by mouth every other day.    Yes [provider]  losartan (COZAAR) 25 MG tablet Take 1 tablet (25 mg total) by mouth daily. 09/25/14  Yes Advani, Vernon Prey, MD  losartan (COZAAR) 25 MG tablet Take 25 mg by mouth daily.   Yes [provider]  meclizine (ANTIVERT) 12.5 MG tablet Take 1 tablet (12.5 mg total) by mouth 3 (three) times daily as needed for dizziness. 11/13/14  Yes Lacretia Leigh, MD  metFORMIN (GLUCOPHAGE) 1000 MG tablet Take 1,000 mg by mouth 2 (two) times daily with a meal.   Yes [provider]  mometasone (NASONEX) 50 MCG/ACT nasal spray Place 2 sprays into the nose 2 (two) times daily as needed (seasonal allergies).   Yes [provider]  NONFORMULARY OR COMPOUNDED ITEM Estradiol 0.02 % 80ml prefilled applicator Sig: apply twice a week 01/20/16  Yes Terrance Mass, MD  nystatin (MYCOSTATIN) 100000 UNIT/ML suspension Take 5 mLs by mouth 2 (two) times daily. For mouth 10/12/14  Yes [provider]  Omega-3 Fatty Acids (FISH OIL PO) Take 1 capsule by mouth 2 (two) times daily.   Yes [provider]  ondansetron (ZOFRAN ODT) 4 MG disintegrating tablet Take 1 tablet (4 mg total) by mouth every 8 (eight) hours as needed for nausea or vomiting. 11/25/16  Yes Sherwood Gambler, MD  pantoprazole (PROTONIX)  40 MG tablet Take 1 tablet (40 mg total) by mouth daily. 09/25/14  Yes Advani, Deepak,  MD  pantoprazole (PROTONIX) 40 MG tablet Take 40 mg by mouth daily.   Yes [provider]  polyethylene glycol (MIRALAX / GLYCOLAX) packet Take 17 g by mouth every Monday, Wednesday, and Friday. Mix in 8 oz liquid and drink   Yes [provider]  simvastatin (ZOCOR) 40 MG tablet TAKE 1 TABLET BY MOUTH EVERY EVENING. NEED OFFICE VISIT FOR MORE REFILLS 08/12/15  Yes Funches, Josalyn, MD  simvastatin (ZOCOR) 40 MG tablet Take 40 mg by mouth every evening.   Yes [provider]  acetaminophen (TYLENOL) 500 MG tablet Take 1 tablet (500 mg total) by mouth every 6 (six) hours as needed (pain). 11/30/16   Arrien, Jimmy Picket, MD  cephALEXin (KEFLEX) 500 MG capsule Take 1 capsule (500 mg total) by mouth every 12 (twelve) hours. 11/30/16 12/03/16  Arrien, Jimmy Picket, MD  naproxen sodium (ALEVE) 220 MG tablet Take 2 tablets (440 mg total) by mouth 2 (two) times daily as needed (pain). 11/30/16   Arrien, Jimmy Picket, MD    Blood pressure (!) 170/67, pulse 85, temperature 98 F (36.7 C), temperature source Oral, resp. rate 18, height 5\' 7"  (1.702 m), weight 137 lb (62.1 kg), SpO2 98 %. Physical Exam: General: pleasant, WD/WN AA female who is laying in bed in NAD HEENT: head is normocephalic, atraumatic.  Sclera are noninjected.  Pupils equal and round.  Ears and nose without any masses or lesions.  Mouth is pink and moist. Dentition fair Heart: regular, rate, and rhythm.  No obvious murmurs, gallops, or rubs noted.  Palpable pedal pulses bilaterally Lungs: CTAB, no wheezes, rhonchi, or rales noted.  Respiratory effort nonlabored Abd: well healed vertical incision distal to umbilicus, soft, distended, mild global tenderness, +BS, no masses, hernias, or organomegaly. No rebound or guarding MS: all 4 extremities are symmetrical with no cyanosis, clubbing, or edema. Skin: warm and dry with no  masses, lesions, or rashes Psych: alert and oriented to self with an appropriate affect. Neuro: cranial nerves grossly intact, extremity CSM intact bilaterally, normal speech  Results for orders placed or performed during the hospital encounter of 11/25/16 (from the past 48 hour(s))  Glucose, capillary     Status: Abnormal   Collection Time: 11/29/16  5:09 PM  Result Value Ref Range   Glucose-Capillary 148 (H) 65 - 99 mg/dL   Comment 1 Notify RN    Comment 2 Document in Chart   Glucose, capillary     Status: None   Collection Time: 11/29/16  8:19 PM  Result Value Ref Range   Glucose-Capillary 87 65 - 99 mg/dL  Glucose, capillary     Status: None   Collection Time: 11/29/16 11:48 PM  Result Value Ref Range   Glucose-Capillary 77 65 - 99 mg/dL  Glucose, capillary     Status: None   Collection Time: 11/30/16  4:03 AM  Result Value Ref Range   Glucose-Capillary 69 65 - 99 mg/dL  Glucose, capillary     Status: None   Collection Time: 11/30/16  4:31 AM  Result Value Ref Range   Glucose-Capillary 81 65 - 99 mg/dL  Glucose, capillary     Status: Abnormal   Collection Time: 11/30/16  7:13 AM  Result Value Ref Range   Glucose-Capillary 104 (H) 65 - 99 mg/dL   Comment 1 Notify RN    Comment 2 Document in Chart   Glucose, capillary     Status: Abnormal   Collection Time: 11/30/16 11:34 AM  Result Value Ref Range  Glucose-Capillary 153 (H) 65 - 99 mg/dL   Comment 1 Notify RN    Comment 2 Document in Chart   Glucose, capillary     Status: Abnormal   Collection Time: 11/30/16  6:11 PM  Result Value Ref Range   Glucose-Capillary 178 (H) 65 - 99 mg/dL  Glucose, capillary     Status: Abnormal   Collection Time: 11/30/16  9:19 PM  Result Value Ref Range   Glucose-Capillary 149 (H) 65 - 99 mg/dL  Glucose, capillary     Status: Abnormal   Collection Time: 11/30/16 10:06 PM  Result Value Ref Range   Glucose-Capillary 128 (H) 65 - 99 mg/dL  Glucose, capillary     Status: Abnormal    Collection Time: 11/30/16 11:54 PM  Result Value Ref Range   Glucose-Capillary 127 (H) 65 - 99 mg/dL  Glucose, capillary     Status: None   Collection Time: 12/01/16  4:41 AM  Result Value Ref Range   Glucose-Capillary 82 65 - 99 mg/dL  Glucose, capillary     Status: None   Collection Time: 12/01/16  7:52 AM  Result Value Ref Range   Glucose-Capillary 73 65 - 99 mg/dL  Glucose, capillary     Status: None   Collection Time: 12/01/16 12:30 PM  Result Value Ref Range   Glucose-Capillary 76 65 - 99 mg/dL   Ct Abdomen Pelvis W Contrast  Result Date: 12/01/2016 CLINICAL DATA:  confusion, malaise and abdominal pain. Sepsis, abdominal pain. EXAM: CT ABDOMEN AND PELVIS WITH CONTRAST TECHNIQUE: Multidetector CT imaging of the abdomen and pelvis was performed using the standard protocol following bolus administration of intravenous contrast. CONTRAST:  153mL ISOVUE-300 IOPAMIDOL (ISOVUE-300) INJECTION 61%, 69mL ISOVUE-300 IOPAMIDOL (ISOVUE-300) INJECTION 61% COMPARISON:  CT abdomen dated 11/25/2016. FINDINGS: Lower chest: Bilateral pleural effusions, at least moderate in size, incompletely imaged, with adjacent compressive atelectasis. Hepatobiliary: No focal liver abnormality is seen. No gallstones, gallbladder wall thickening, or biliary dilatation. Pancreas: Unremarkable. No pancreatic ductal dilatation or surrounding inflammatory changes. Spleen: Normal in size without focal abnormality. Adrenals/Urinary Tract: Adrenal glands appear normal. Left renal cyst. Kidneys otherwise unremarkable without stone or hydronephrosis. No ureteral or bladder calculi identified. Bladder is decompressed by Foley catheter. Stomach/Bowel: Diffusely dilated fluid-filled small bowel throughout the abdomen and pelvis. Two possible transition zones identified, left and right hemipelvis, with decompression of the distal small bowel (series 2, images 69 and 70 ; series 2, images 63 through 65). Colon is diffusely decompressed  indicating complete versus high-grade partial small bowel obstruction. Vascular/Lymphatic: Aortic atherosclerosis. No enlarged abdominal or pelvic lymph nodes. Reproductive: Status post hysterectomy. Other: Moderate amount of free fluid in the abdomen and pelvis. No circumscribed abscess collection identified. No free intraperitoneal air seen. Musculoskeletal: Degenerative changes throughout the thoracolumbar spine, mild to moderate in degree. No acute or suspicious osseous finding. Diffuse ill-defined fluid/edema within the subcutaneous soft tissues of the abdomen and pelvis, consistent with anasarca. IMPRESSION: 1. Small-bowel obstruction, with diffusely dilated fluid-filled small bowel throughout the abdomen and pelvis, with decompressed distal small bowel and colon, consistent with complete small bowel obstruction versus high-grade partial obstruction. Two possible sites of obstruction within the pelvis raising the possibility of closed loop obstruction. 2. Moderate amount of ascites within the abdomen and pelvis. No abscess collection seen. 3. Bilateral pleural effusions, at least moderate in size, incompletely imaged. 4. Anasarca. 5. Aortic atherosclerosis. These results were called by telephone at the time of interpretation on 12/01/2016 at 3:32 pm to Dr. Sander Radon ,  who verbally acknowledged these results. Electronically Signed   By: Franki Cabot M.D.   On: 12/01/2016 15:36   Dg Abd 2 Views  Result Date: 11/30/2016 CLINICAL DATA:  Weakness and diffuse abdominal pain, continuing this admission. EXAM: ABDOMEN - 2 VIEW COMPARISON:  CT of the abdomen and pelvis 11/25/2016 FINDINGS: There has been interval development of significant small bowel dilatation. No evidence for free intraperitoneal air. Air-fluid levels are identified within the small bowel loops. There is paucity of large bowel gas. IMPRESSION: Interval development of small bowel obstruction. Electronically Signed   By: Nolon Nations M.D.    On: 11/30/2016 19:56   Anti-infectives    Start     Dose/Rate Route Frequency Ordered Stop   12/01/16 1200  cefTRIAXone (ROCEPHIN) 1 g in dextrose 5 % 50 mL IVPB     1 g 100 mL/hr over 30 Minutes Intravenous Every 24 hours 12/01/16 1021     11/30/16 2200  cephALEXin (KEFLEX) capsule 500 mg  Status:  Discontinued     500 mg Oral Every 12 hours 11/30/16 1141 12/01/16 1021   11/30/16 0000  cephALEXin (KEFLEX) 500 MG capsule     500 mg Oral Every 12 hours 11/30/16 1221 12/03/16 2359   11/26/16 1800  vancomycin (VANCOCIN) IVPB 750 mg/150 ml premix  Status:  Discontinued     750 mg 150 mL/hr over 60 Minutes Intravenous Every 24 hours 11/25/16 1826 11/26/16 0819   11/26/16 1000  cefTRIAXone (ROCEPHIN) 1 g in dextrose 5 % 50 mL IVPB  Status:  Discontinued     1 g 100 mL/hr over 30 Minutes Intravenous Every 24 hours 11/26/16 0819 11/30/16 1141   11/26/16 0000  piperacillin-tazobactam (ZOSYN) IVPB 3.375 g  Status:  Discontinued     3.375 g 12.5 mL/hr over 240 Minutes Intravenous Every 8 hours 11/25/16 1826 11/26/16 0819   11/25/16 1815  vancomycin (VANCOCIN) IVPB 1000 mg/200 mL premix     1,000 mg 200 mL/hr over 60 Minutes Intravenous  Once 11/25/16 1807 11/25/16 1926   11/25/16 1815  piperacillin-tazobactam (ZOSYN) IVPB 3.375 g     3.375 g 100 mL/hr over 30 Minutes Intravenous  Once 11/25/16 1807 11/25/16 1919        Assessment/Plan SBO - abdominal surgical history: hysterectomy - admitted 5/9 for DKA and UTI - successfully treated and thought to be ready for discharge until she developed SBO - CT scan shows SBO with diffusely dilated fluid-filled small bowel throughout the abdomen and pelvis; two possible sites of obstruction within the pelvis raising the possibility of closed loop obstruction - VSS, no recent labs  DKA - resolved UTI - on ceftriaxone day #5 IDDM HTN HLD Hypothyroidism  Plan - Agree with NG tube decompression and bowel rest. CT reports concern for closed loop  obstruction, will review with MD. Repeat labs.  Jerrye Beavers, [facility] Surgery 12/01/2016, 3:58 PM Pager: [phone] Consults: [phone] Mon-Fri 7:00 am-4:30 pm Sat-Sun 7:00 am-11:30 am

## 2016-12-01 NOTE — Care Management Important Message (Signed)
Important Message  Patient Details  Name: [name] [name] MRN: [id number] Date of Birth: 1934/02/22   Medicare Important Message Given:  Yes    Kerin Salen 12/01/2016, 11:21 AMImportant Message  Patient Details  Name: [name] [name] MRN: [id number] Date of Birth: 05-23-1934   Medicare Important Message Given:  Yes    Kerin Salen 12/01/2016, 11:21 AM

## 2016-12-02 ENCOUNTER — Inpatient Hospital Stay (HOSPITAL_COMMUNITY): Payer: Medicare HMO

## 2016-12-02 ENCOUNTER — Encounter: Payer: Self-pay | Admitting: Gynecology

## 2016-12-02 DIAGNOSIS — E1010 Type 1 diabetes mellitus with ketoacidosis without coma: Secondary | ICD-10-CM

## 2016-12-02 DIAGNOSIS — K5900 Constipation, unspecified: Secondary | ICD-10-CM

## 2016-12-02 LAB — GLUCOSE, CAPILLARY
GLUCOSE-CAPILLARY: 125 mg/dL — AB (ref 65–99)
GLUCOSE-CAPILLARY: 127 mg/dL — AB (ref 65–99)
GLUCOSE-CAPILLARY: 148 mg/dL — AB (ref 65–99)
Glucose-Capillary: 142 mg/dL — ABNORMAL HIGH (ref 65–99)
Glucose-Capillary: 122 mg/dL — ABNORMAL HIGH (ref 65–99)
Glucose-Capillary: 159 mg/dL — ABNORMAL HIGH (ref 65–99)

## 2016-12-02 MED ORDER — PHENOL 1.4 % MT LIQD
1.0000 | OROMUCOSAL | Status: DC | PRN
  Administered 2016-12-02: 1 via OROMUCOSAL
  Filled 2016-12-02 (×2): qty 177

## 2016-12-02 NOTE — Progress Notes (Signed)
Per PT, pt not functionally/safely able to ambulate. Pt still up in chair at this time.

## 2016-12-02 NOTE — Progress Notes (Signed)
PROGRESS NOTE    [name] [name]  EXN:[id number] DOB: 1934/03/10 DOA: 11/25/2016 PCP: Aura Dials, MD   Chief Complaint  Patient presents with  . Altered Mental Status  . Hematuria    Brief Narrative:  HPI On 11/25/2016 by Dr. Myrene Buddy [name] [name] is a 83 y.o. female with a past medical history significant for IDDM, hypothyroidism and HTN who presents with 2 days abdominal pain, malaise and now confusion. All history is collected from the patient's daughter, as the patient is obtunded. Per daughter on Monday patient saw her primary doctor who decreased her insulin because of night time lows or highs (she is not sure). Then Tuesday evening on the phone, the patient seemed a little bit "agitated" or "tired" and later that night she called her daughter complaining of some abdominal discomfort, "upset stomach", and asked to be taken to the emergency room.  In the ER, she was quite hyperglycemic but bicarb and gap normal, no leukocytosis, normal renal function.  Lipase elevated but CT unremarkable except moderate ascites.  It seems she felt better and was prepared for discharge, only out of the ordinary finding that she was given a foley bag for retained urine. Per daughter, she slep this morning, and then in the afternoon got up and was altered, totally confused, pulled out her foley and had some gross hematuria (at baseline she lives alone, drives, does her finances online, is totally independent, manages her own meds).  Daughter is aware of no fever, cough, sputum production, dyrsuria, urinary irritation, recent diarrhea (although she has episodes from time to time).  Interim history Patient admitted with sepsis and DKA. Was also found to have acute urinary retention, Foley catheter was placed, failed voiding trial. Patient was also noted to have ileus with bowel traction. General surgery consulted.  Assessment & Plan   DKA/diabetes mellitus, type II -It was thought that this  was a due to patient decreasing her insulin over the past week prior to admission, vs infection -patient was on metformin at home -Resolved, patient did require glucose stabilizer however was transitioned to levemir and ISS  Sepsis secondary to urinary tract infection -Patient didn't have leukocytosis, low-grade temperature, elevated lactic acid, on admission. -Lactic acidosis was thought to be exacerbated by metformin -Patient was started on vancomycin and Zosyn, currently on ceftriaxone -urine culture showed multiple species, subsequent urine culture showed no growth -Blood culture showed no growth  Urinary retention -Required Foley catheter placement, patient did fail a voiding trial -Will likely be discharged with Foley catheter in place and outpatient urology follow up  Acute kidney injury with hyperkalemia -Creatinine was 1.57 on admission, currently 0.76 -Resolved with IV fluids -Hyperkalemia resolved  Metabolic encephalopathy -Appears to be at baseline per daughter at bedside  Hypothyroidism -Continue Synthroid  Dyslipidemia -Continue statin when tolerating PO   Abdominal pain/Small bowel obstruction -Upon admission, patient did have elevated lipase at 77 which digested pancreatitis however CT scan was normal -Suspected cause was DKA -CT abdomen and pelvis was done on 11/25/2016 showing moderate diffuse abdominal and pelvic ascites, possible gastric wall thickening which could be under distention. No evidence of bowel obstruction -CT abdomen and pelvis on 12/01/2016 showed small bowel obstruction with diffusely dilated fluid-filled small bowel throughout the abdomen and pelvis, 2 possible sites of obstruction and the pelvis raising possibility of closed loop obstruction. Moderate amount of ascites in the abdomen and pelvis -Patient does have NG tube in place -Continue IV fluids, antiemetics -Gen. surgery consulted and  appreciated pending recommendations  Elevated  troponin -Possible demand ischemia from DKA, sepsis -Troponin up to 0.44 -Echocardiogram showed EF of 65-70%, no regional wall motion abnormalities, grade 1 diastolic dysfunction   Essential hypertension -continue IV hydralazine as needed  DVT Prophylaxis  lovenox  Code Status: Full  Family Communication: Daughter at bedside  Disposition Plan: Admitted. Pending general surgery recommendations  Consultants General surgery  Procedures  Echocardiogram  Antibiotics   Anti-infectives    Start     Dose/Rate Route Frequency Ordered Stop   12/01/16 1200  cefTRIAXone (ROCEPHIN) 1 g in dextrose 5 % 50 mL IVPB     1 g 100 mL/hr over 30 Minutes Intravenous Every 24 hours 12/01/16 1021     11/30/16 2200  cephALEXin (KEFLEX) capsule 500 mg  Status:  Discontinued     500 mg Oral Every 12 hours 11/30/16 1141 12/01/16 1021   11/30/16 0000  cephALEXin (KEFLEX) 500 MG capsule     500 mg Oral Every 12 hours 11/30/16 1221 12/03/16 2359   11/26/16 1800  vancomycin (VANCOCIN) IVPB 750 mg/150 ml premix  Status:  Discontinued     750 mg 150 mL/hr over 60 Minutes Intravenous Every 24 hours 11/25/16 1826 11/26/16 0819   11/26/16 1000  cefTRIAXone (ROCEPHIN) 1 g in dextrose 5 % 50 mL IVPB  Status:  Discontinued     1 g 100 mL/hr over 30 Minutes Intravenous Every 24 hours 11/26/16 0819 11/30/16 1141   11/26/16 0000  piperacillin-tazobactam (ZOSYN) IVPB 3.375 g  Status:  Discontinued     3.375 g 12.5 mL/hr over 240 Minutes Intravenous Every 8 hours 11/25/16 1826 11/26/16 0819   11/25/16 1815  vancomycin (VANCOCIN) IVPB 1000 mg/200 mL premix     1,000 mg 200 mL/hr over 60 Minutes Intravenous  Once 11/25/16 1807 11/25/16 1926   11/25/16 1815  piperacillin-tazobactam (ZOSYN) IVPB 3.375 g     3.375 g 100 mL/hr over 30 Minutes Intravenous  Once 11/25/16 1807 11/25/16 1919      Subjective:   [name] [name] seen and examined today. Denies chest pain, shortness of breath, dizziness, or headache.  Complains of abdominal pain, some nausea, without vomiting, and constipation.   Objective:   Vitals:   12/01/16 1016 12/01/16 1607 12/01/16 2139 12/02/16 0513  BP: (!) 170/67 (!) 161/61 (!) 162/63 (!) 154/69  Pulse:  90 87 (!) 101  Resp:   18 20  Temp:  98.5 F (36.9 C) 98.3 F (36.8 C) 98 F (36.7 C)  TempSrc:  Oral Oral Oral  SpO2:  100% 100% 96%  Weight:      Height:        Intake/Output Summary (Last 24 hours) at 12/02/16 1453 Last data filed at 12/02/16 1000  Gross per 24 hour  Intake             1275 ml  Output              950 ml  Net              325 ml   Filed Weights   11/25/16 1757 11/26/16 0017 11/29/16 1107  Weight: 63.5 kg (140 lb) 61.1 kg (134 lb 11.2 oz) 62.1 kg (137 lb)    Exam  General: Well developed, well nourished, NAD, appears stated age  HEENT: NCAT, mucous membranes moist. NG tube in place  Cardiovascular: S1 S2 auscultated, no rubs, murmurs or gallops. Regular rate and rhythm.  Respiratory: Clear to auscultation bilaterally with equal chest  rise  Abdomen: Soft, mildly TTP, nondistended, + bowel sounds  Extremities: warm dry without cyanosis clubbing or edema  Neuro: AAOx3, nonfocal  Psych: Normal affect and demeanor    Data Reviewed: I have personally reviewed following labs and imaging studies  CBC:  Recent Labs Lab 11/25/16 1506 11/27/16 0320 11/28/16 0403 11/29/16 0404 12/01/16 1718  WBC 12.0* 8.8 9.1 8.9 9.8  NEUTROABS  --  6.9 7.4 7.4  --   HGB 11.7* 10.0* 12.5 12.3 12.2  HCT 35.2* 30.1* 36.6 36.5 36.1  MCV 92.9 90.7 89.9 90.6 90.5  PLT 286 253 284 286 169   Basic Metabolic Panel:  Recent Labs Lab 11/26/16 0106  11/26/16 1431 11/27/16 0320 11/28/16 0403 11/29/16 0404 12/01/16 1718  NA 137  < > 141 135 137 135 128*  K 4.2  < > 3.6 3.7 3.4* 3.5 3.5  CL 106  < > 111 105 106 106 92*  CO2 17*  < > 24 24 23  20* 24  GLUCOSE 398*  < > 127* 195* 91 118* 164*  BUN 40*  < > 31* 20 14 15 10   CREATININE 1.57*  < >  1.26* 0.88 0.79 0.81 0.76  CALCIUM 9.0  < > 8.4* 7.9* 8.5* 8.2* 9.2  MG 1.9  --   --   --   --   --   --   PHOS 2.8  --   --   --   --   --   --   < > = values in this interval not displayed. GFR: Estimated Creatinine Clearance: 54.5 mL/min (by C-G formula based on SCr of 0.76 mg/dL). Liver Function Tests:  Recent Labs Lab 11/25/16 1506 11/28/16 0403  AST 27 19  ALT 29 16  ALKPHOS 71 53  BILITOT 1.1 0.6  PROT 7.4 6.0*  ALBUMIN 4.2 3.1*    Recent Labs Lab 11/26/16 0511  LIPASE 22    Recent Labs Lab 11/25/16 1608  AMMONIA 14   Coagulation Profile: No results for input(s): INR, PROTIME in the last 168 hours. Cardiac Enzymes:  Recent Labs Lab 11/26/16 0106  TROPONINI 0.44*   BNP (last 3 results) No results for input(s): PROBNP in the last 8760 hours. HbA1C: No results for input(s): HGBA1C in the last 72 hours. CBG:  Recent Labs Lab 12/01/16 2138 12/02/16 0126 12/02/16 0502 12/02/16 0817 12/02/16 1236  GLUCAP 125* 142* 125* 122* 127*   Lipid Profile: No results for input(s): CHOL, HDL, LDLCALC, TRIG, CHOLHDL, LDLDIRECT in the last 72 hours. Thyroid Function Tests: No results for input(s): TSH, T4TOTAL, FREET4, T3FREE, THYROIDAB in the last 72 hours. Anemia Panel: No results for input(s): VITAMINB12, FOLATE, FERRITIN, TIBC, IRON, RETICCTPCT in the last 72 hours. Urine analysis:    Component Value Date/Time   COLORURINE RED (A) 11/25/2016 1507   APPEARANCEUR HAZY (A) 11/25/2016 1507   LABSPEC 1.024 11/25/2016 1507   PHURINE 5.0 11/25/2016 1507   GLUCOSEU >=500 (A) 11/25/2016 1507   HGBUR LARGE (A) 11/25/2016 1507   BILIRUBINUR NEGATIVE 11/25/2016 1507   BILIRUBINUR neg 05/06/2015 1421   KETONESUR 20 (A) 11/25/2016 1507   PROTEINUR 100 (A) 11/25/2016 1507   UROBILINOGEN 1.0 05/06/2015 1421   UROBILINOGEN 0.2 12/25/2014 1206   NITRITE NEGATIVE 11/25/2016 1507   LEUKOCYTESUR NEGATIVE 11/25/2016 1507   Sepsis  Labs: @LABRCNTIP (procalcitonin:4,lacticidven:4)  ) Recent Results (from the past 240 hour(s))  Urine culture     Status: Abnormal   Collection Time: 11/25/16  1:17 AM  Result  Value Ref Range Status   Specimen Description URINE, RANDOM  Final   Special Requests NONE  Final   Culture MULTIPLE SPECIES PRESENT, SUGGEST RECOLLECTION (A)  Final   Report Status 11/26/2016 FINAL  Final  Urine culture     Status: None   Collection Time: 11/25/16  3:07 PM  Result Value Ref Range Status   Specimen Description URINE, CLEAN CATCH  Final   Special Requests NONE  Final   Culture   Final    NO GROWTH Performed at [facility], [place] [address] [place], [place] [address]    Report Status 11/27/2016 FINAL  Final  Blood Culture (routine x 2)     Status: None   Collection Time: 11/25/16  6:49 PM  Result Value Ref Range Status   Specimen Description BLOOD LEFT HAND  Final   Special Requests IN PEDIATRIC BOTTLE Blood Culture adequate volume  Final   Culture   Final    NO GROWTH 5 DAYS Performed at [facility], [place] [address], [place], [place] [address]    Report Status 11/30/2016 FINAL  Final  MRSA PCR Screening     Status: None   Collection Time: 11/26/16 12:23 AM  Result Value Ref Range Status   MRSA by PCR NEGATIVE NEGATIVE Final    Comment:        The GeneXpert MRSA Assay (FDA approved for NASAL specimens only), is one component of a comprehensive MRSA colonization surveillance program. It is not intended to diagnose MRSA infection nor to guide or monitor treatment for MRSA infections.       Radiology Studies: Dg Abd 1 View  Result Date: 12/01/2016 CLINICAL DATA:  83 year old female with nasogastric tube placement. Ascites. Subsequent encounter. EXAM: ABDOMEN - 1 VIEW COMPARISON:  12/01/2016 CT. FINDINGS: Nasogastric tube with tip and side hole gastric fundus level. Small bowel obstructive pattern with gas distended small bowel loops with thickened folds as noted on  recent CT. IMPRESSION: Nasogastric tube with tip and side hole gastric fundus level. Small bowel obstructive pattern. Electronically Signed   By: Genia Del M.D.   On: 12/01/2016 17:15   Ct Abdomen Pelvis W Contrast  Result Date: 12/01/2016 CLINICAL DATA:  confusion, malaise and abdominal pain. Sepsis, abdominal pain. EXAM: CT ABDOMEN AND PELVIS WITH CONTRAST TECHNIQUE: Multidetector CT imaging of the abdomen and pelvis was performed using the standard protocol following bolus administration of intravenous contrast. CONTRAST:  116mL ISOVUE-300 IOPAMIDOL (ISOVUE-300) INJECTION 61%, 35mL ISOVUE-300 IOPAMIDOL (ISOVUE-300) INJECTION 61% COMPARISON:  CT abdomen dated 11/25/2016. FINDINGS: Lower chest: Bilateral pleural effusions, at least moderate in size, incompletely imaged, with adjacent compressive atelectasis. Hepatobiliary: No focal liver abnormality is seen. No gallstones, gallbladder wall thickening, or biliary dilatation. Pancreas: Unremarkable. No pancreatic ductal dilatation or surrounding inflammatory changes. Spleen: Normal in size without focal abnormality. Adrenals/Urinary Tract: Adrenal glands appear normal. Left renal cyst. Kidneys otherwise unremarkable without stone or hydronephrosis. No ureteral or bladder calculi identified. Bladder is decompressed by Foley catheter. Stomach/Bowel: Diffusely dilated fluid-filled small bowel throughout the abdomen and pelvis. Two possible transition zones identified, left and right hemipelvis, with decompression of the distal small bowel (series 2, images 69 and 70 ; series 2, images 63 through 65). Colon is diffusely decompressed indicating complete versus high-grade partial small bowel obstruction. Vascular/Lymphatic: Aortic atherosclerosis. No enlarged abdominal or pelvic lymph nodes. Reproductive: Status post hysterectomy. Other: Moderate amount of free fluid in the abdomen and pelvis. No circumscribed abscess collection identified. No free intraperitoneal  air seen. Musculoskeletal: Degenerative changes throughout the thoracolumbar spine, mild to moderate in degree. No acute or suspicious osseous finding. Diffuse ill-defined fluid/edema within the subcutaneous soft tissues of the abdomen and pelvis, consistent with anasarca. IMPRESSION: 1. Small-bowel obstruction, with diffusely dilated fluid-filled small bowel throughout the abdomen and pelvis, with decompressed distal small bowel and colon, consistent with complete small bowel obstruction versus high-grade partial obstruction. Two possible sites of obstruction within the pelvis raising the possibility of closed loop obstruction. 2. Moderate amount of ascites within the abdomen and pelvis. No abscess collection seen. 3. Bilateral pleural effusions, at least moderate in size, incompletely imaged. 4. Anasarca. 5. Aortic atherosclerosis. These results were called by telephone at the time of interpretation on 12/01/2016 at 3:32 pm to Dr. Sander Radon , who verbally acknowledged these results. Electronically Signed   By: Franki Cabot M.D.   On: 12/01/2016 15:36   Dg Abd 2 Views  Result Date: 11/30/2016 CLINICAL DATA:  Weakness and diffuse abdominal pain, continuing this admission. EXAM: ABDOMEN - 2 VIEW COMPARISON:  CT of the abdomen and pelvis 11/25/2016 FINDINGS: There has been interval development of significant small bowel dilatation. No evidence for free intraperitoneal air. Air-fluid levels are identified within the small bowel loops. There is paucity of large bowel gas. IMPRESSION: Interval development of small bowel obstruction. Electronically Signed   By: Nolon Nations M.D.   On: 11/30/2016 19:56     Scheduled Meds: . enoxaparin (LOVENOX) injection  40 mg Subcutaneous Q24H  . insulin aspart  0-9 Units Subcutaneous Q4H  . insulin detemir  4 Units Subcutaneous BID  . levothyroxine  50 mcg Intravenous Daily  . pantoprazole (PROTONIX) IV  40 mg Intravenous Q24H   Continuous Infusions: .  cefTRIAXone (ROCEPHIN)  IV Stopped (12/02/16 1316)  . dextrose 5 % and 0.45% NaCl 75 mL/hr at 12/02/16 0833     LOS: 7 days   Time Spent in minutes   30 minutes  Saya Mccoll D.O. on 12/02/2016 at 2:53 PM  Between 7am to 7pm - Pager - [phone]  After 7pm go to [email or web address] - password [email or web address]  And look for the night coverage person covering for me after hours  [facility] Office  [phone]

## 2016-12-02 NOTE — Progress Notes (Signed)
Occupational Therapy Treatment Patient Details Name: [name] [name] MRN: [id number] DOB: [birth year] Today's Date: 12/02/2016    History of present illness  83 y.o. female with a past medical history significant for IDDM, hypothyroidism and HTN who presents with 2 days abdominal pain, malaise and now confusion.   OT comments  Pt very sleepy which limited OT session this day.  Follow Up Recommendations  Supervision/Assistance - 24 hour;SNF    Equipment Recommendations  3 in 1 bedside commode       Precautions / Restrictions Precautions Precautions: Fall Precaution Comments: h/o 1 fall in past 1 year Restrictions Weight Bearing Restrictions: No       Mobility Bed Mobility Overal bed mobility: Needs Assistance Bed Mobility: Supine to Sit;Sit to Supine     Supine to sit: Mod assist;+2 for physical assistance     General bed mobility comments: pt declined  Transfers Overall transfer level: Needs assistance Equipment used: Rolling walker (2 wheeled) Transfers: Sit to/from [facility] Sit to Stand: Mod assist;+2 physical assistance;+2 safety/equipment Stand pivot transfers: Mod assist;+2 physical assistance;+2 safety/equipment       General transfer comment: pt declined    Balance                                           ADL either performed or assessed with clinical judgement   ADL Overall ADL's : Needs assistance/impaired     Grooming: Set up;Sitting;Wash/dry face                                 General ADL Comments: Lengthy discussion regarding OT role and pts ADL activity with daughter.  Daughter recognizes need for rehab to increase pts I as she was living alone.  Pt was sitting in chair and also particiated in discussion.               Cognition Arousal/Alertness: Awake/alert Behavior During Therapy: WFL for tasks assessed/performed Overall Cognitive Status: Within Functional Limits for tasks  assessed                                                     Pertinent Vitals/ Pain       Pain Assessment: No/denies pain Pain Score: 4  Pain Location: abdomen area Pain Descriptors / Indicators: Discomfort Pain Intervention(s): Monitored during session  Home Living                                              Frequency  Min 2X/week        Progress Toward Goals  OT Goals(current goals can now be found in the care plan section)  Progress towards OT goals: OT to reassess next treatment     Plan Discharge plan needs to be updated       AM-PAC PT "6 Clicks" Daily Activity     Outcome Measure   Help from another person eating meals?: A Little Help from another person taking care of personal grooming?: A Little Help from another person toileting, which includes using  toliet, bedpan, or urinal?: A Lot Help from another person bathing (including washing, rinsing, drying)?: A Lot Help from another person to put on and taking off regular upper body clothing?: A Little Help from another person to put on and taking off regular lower body clothing?: A Lot 6 Click Score: 15    End of Session    OT Visit Diagnosis: Muscle weakness (generalized) (M62.81)   Activity Tolerance Patient tolerated treatment well   Patient Left in bed;with call bell/phone within reach;with nursing/sitter in room   Nurse Communication Mobility status        Time: 5189-8421 OT Time Calculation (min): 10 min  Charges: OT General Charges $OT Visit: 1 Procedure OT Treatments $Self Care/Home Management : 8-22 mins  [place], [place] [phone]   Payton Mccallum D 12/02/2016, 3:39 PM

## 2016-12-02 NOTE — Progress Notes (Signed)
Physical Therapy Treatment Patient Details Name: [name] [name] MRN: [id number] DOB: [birth year] Today's Date: 12/02/2016    History of Present Illness  83 y.o. female with a past medical history significant for IDDM, hypothyroidism and HTN who presents with 2 days abdominal pain, malaise and now confusion.    PT Comments    Pt in bed with NG suction.  RN assisted with clamping for session.  2 daughters present and encouraged to stay to observe.  One daughter stayed to help the other chose to leave stating she could bear to see her mother  Struggle. Assisted pt to EOB required + 2 assist.  Pt c/o "weakness" and feeling really "tired".  Took vitals with each position change. Supine: BP 145/56, HR 84, RA 93% EOB:    BP 139/61, HR 109, RA 96% Unable to take vitals during standing, pt too weak to tolerate time After transfer in recliner: BP 145/65, HR 110, RA 97% Pt was unable to functionally stand and unable to progress gait but a few steps to recliner.  B knees buckling.  Limited activity tolerance.   RN reports pt was given Morphine prior to session for ABD pain. Discussed significant weakness and extended length of stay, pt will most likely need ST REHAB at SNF.  Daughter who stayed and assisted me agreed.  Daughter who left and came back declined.  When I asked who would be helping pt and taking her home, it was the daughter who stayed.  Family dynamics present.   Follow Up Recommendations  SNF;Home health PT  Pt is a + 2 assist for transfers and not functionally/safely  able to amb.  Pt would benefit from a more aggressive Rehab at SNF. Daughter who stayed agreed to a Social Work Consult for a Pension scheme manager.  Other daughter remained silent. D/C plans pending     Equipment Recommendations  None recommended by PT    Recommendations for Other Services       Precautions / Restrictions Precautions Precautions: Fall Precaution Comments: weak  Restrictions Weight Bearing Restrictions:  No    Mobility  Bed Mobility Overal bed mobility: Needs Assistance Bed Mobility: Supine to Sit;Sit to Supine     Supine to sit: Mod assist;+2 for physical assistance     General bed mobility comments: required increased assist and limited ability to self sit EOB.  Required Mod Assist to prevent posterior lean  Transfers Overall transfer level: Needs assistance Equipment used: Rolling walker (2 wheeled) Transfers: Sit to/from [facility] Sit to Stand: Mod assist;+2 physical assistance;+2 safety/equipment Stand pivot transfers: Mod assist;+2 physical assistance;+2 safety/equipment       General transfer comment: required increased assist to rise feeling "weak".  Daughter assisted.  pt only able to take a few side steps to recliner with noted B knee buckling   Ambulation/Gait Ambulation/Gait assistance: Max assist;Mod assist;+2 physical assistance;+2 safety/equipment Ambulation Distance (Feet): 2 Feet Assistive device: Rolling walker (2 wheeled)   Gait velocity: decreased    General Gait Details: pt significantly weaker and RN stated pt received Morphine prior to session.  Required + 2 max assist and was unable to progress gait past a few steps to recliner.    Stairs            Wheelchair Mobility    Modified Rankin (Stroke Patients Only)       Balance  Cognition Arousal/Alertness: Awake/alert Behavior During Therapy: WFL for tasks assessed/performed Overall Cognitive Status: Within Functional Limits for tasks assessed                                        Exercises      General Comments        Pertinent Vitals/Pain Pain Assessment: No/denies pain Pain Location: ABD bloating  Pain Descriptors / Indicators: Discomfort Pain Intervention(s): Monitored during session    Home Living                      Prior Function            PT Goals (current  goals can now be found in the care plan section) Progress towards PT goals: Progressing toward goals    Frequency    Min 3X/week      PT Plan Discharge plan needs to be updated    Co-evaluation              AM-PAC PT "6 Clicks" Daily Activity  Outcome Measure  Difficulty turning over in bed (including adjusting bedclothes, sheets and blankets)?: A Lot Difficulty moving from lying on back to sitting on the side of the bed? : A Lot Difficulty sitting down on and standing up from a chair with arms (e.g., wheelchair, bedside commode, etc,.)?: A Lot Help needed moving to and from a bed to chair (including a wheelchair)?: A Lot Help needed walking in hospital room?: Total Help needed climbing 3-5 steps with a railing? : Total 6 Click Score: 10    End of Session Equipment Utilized During Treatment: Gait belt Activity Tolerance: Patient tolerated treatment well Patient left: in chair;with call bell/phone within reach;with chair alarm set Nurse Communication: Mobility status PT Visit Diagnosis: History of falling (Z91.81);Difficulty in walking, not elsewhere classified (R26.2)     Time: 9371-6967 PT Time Calculation (min) (ACUTE ONLY): 48 min  Charges:  $Gait Training: 8-22 mins $Therapeutic Activity: 23-37 mins                    G Codes:       Rica Koyanagi  PTA [facility]  Acute  Rehab Pager      [phone]

## 2016-12-02 NOTE — Progress Notes (Signed)
    CC: Altered mental status Hematuria  Subjective: Still distended but not vomiting now nausea is better.  NG sump not working I fixed it.    Objective: Vital signs in last 24 hours: Temp:  [98 F (36.7 C)-98.5 F (36.9 C)] 98 F (36.7 C) (05/16 0513) Pulse Rate:  [87-101] 101 (05/16 0513) Resp:  [18-20] 20 (05/16 0513) BP: (154-162)/(61-69) 154/69 (05/16 0513) SpO2:  [96 %-100 %] 96 % (05/16 0513) Last BM Date: 11/26/16 Nothing PO or IV recorded 1350 urine No NG or stool recorded Afebrile, VSS Na 128, glucose 164 CT 12/01/16:  Small-bowel obstruction, with diffusely dilated fluid-filled small bowel throughout the abdomen and pelvis, with decompressed distal small bowel and colon, consistent with complete small bowel obstruction versus high-grade partial obstruction. Two possible sites of obstruction within the pelvis raising the possibility of closed loop obstruction.  Film for today pending   Intake/Output from previous day: 05/15 0701 - 05/16 0700 In: -  Out: 1350 [Urine:1350] Intake/Output this shift: Total I/O In: 1275 [I.V.:1275] Out: -   General appearance: alert, cooperative and no distress Resp: clear to auscultation bilaterally GI: distended, with very few BS, no flatus.  PT only got her up to chair.  Bedbound for  a week.    Lab Results:   Recent Labs  12/01/16 1718  WBC 9.8  HGB 12.2  HCT 36.1  PLT 354    BMET  Recent Labs  12/01/16 1718  NA 128*  K 3.5  CL 92*  CO2 24  GLUCOSE 164*  BUN 10  CREATININE 0.76  CALCIUM 9.2   PT/INR No results for input(s): LABPROT, INR in the last 72 hours.   Recent Labs Lab 11/28/16 0403  AST 19  ALT 16  ALKPHOS 53  BILITOT 0.6  PROT 6.0*  ALBUMIN 3.1*     Lipase     Component Value Date/Time   LIPASE 22 11/26/2016 0511     Medications: . enoxaparin (LOVENOX) injection  40 mg Subcutaneous Q24H  . insulin aspart  0-9 Units Subcutaneous Q4H  . insulin detemir  4 Units  Subcutaneous BID  . levothyroxine  50 mcg Intravenous Daily  . pantoprazole (PROTONIX) IV  40 mg Intravenous Q24H   . cefTRIAXone (ROCEPHIN)  IV Stopped (12/02/16 1316)  . dextrose 5 % and 0.45% NaCl 75 mL/hr at 12/02/16 0737    Assessment/Plan SBO DKA UTI Type II diabetes Hypertension Hypothyroid FEN:  NPO/IV fluids ID:  Rocephin day 7 + 1 day of Zosyn DVT:  Lovenox  Plan:  Continue NG,  she needs to be walking daily, no prior abdominal surgeries.  Recheck labs in AM.       LOS: 7 days    [name] [name] 12/02/2016 [phone]

## 2016-12-03 ENCOUNTER — Inpatient Hospital Stay (HOSPITAL_COMMUNITY): Payer: Medicare HMO

## 2016-12-03 ENCOUNTER — Inpatient Hospital Stay (HOSPITAL_COMMUNITY): Payer: Medicare HMO | Admitting: Anesthesiology

## 2016-12-03 ENCOUNTER — Encounter (HOSPITAL_COMMUNITY): Payer: Self-pay | Admitting: Anesthesiology

## 2016-12-03 ENCOUNTER — Encounter (HOSPITAL_COMMUNITY)
Admission: EM | Disposition: A | Discharge status: Skilled Nursing Facility | Payer: Self-pay | Source: Home / Self Care | Attending: Internal Medicine

## 2016-12-03 DIAGNOSIS — R52 Pain, unspecified: Secondary | ICD-10-CM

## 2016-12-03 HISTORY — PX: LAPAROTOMY: SHX154

## 2016-12-03 LAB — BASIC METABOLIC PANEL
Anion gap: 10 (ref 5–15)
BUN: 9 mg/dL (ref 6–20)
CHLORIDE: 94 mmol/L — AB (ref 101–111)
CO2: 25 mmol/L (ref 22–32)
Calcium: 8.5 mg/dL — ABNORMAL LOW (ref 8.9–10.3)
Creatinine, Ser: 0.72 mg/dL (ref 0.44–1.00)
GFR calc Af Amer: >60 mL/min (ref >60)
GFR calc non Af Amer: >60 mL/min (ref >60)
Glucose, Bld: 148 mg/dL — ABNORMAL HIGH (ref 65–99)
POTASSIUM: 3.3 mmol/L — AB (ref 3.5–5.1)
SODIUM: 129 mmol/L — AB (ref 135–145)

## 2016-12-03 LAB — GLUCOSE, CAPILLARY
GLUCOSE-CAPILLARY: 128 mg/dL — AB (ref 65–99)
GLUCOSE-CAPILLARY: 134 mg/dL — AB (ref 65–99)
GLUCOSE-CAPILLARY: 140 mg/dL — AB (ref 65–99)
GLUCOSE-CAPILLARY: 198 mg/dL — AB (ref 65–99)
Glucose-Capillary: 116 mg/dL — ABNORMAL HIGH (ref 65–99)
Glucose-Capillary: 104 mg/dL — ABNORMAL HIGH (ref 65–99)
Glucose-Capillary: 101 mg/dL — ABNORMAL HIGH (ref 65–99)
Glucose-Capillary: 141 mg/dL — ABNORMAL HIGH (ref 65–99)

## 2016-12-03 LAB — CBC
HCT: 31.4 % — ABNORMAL LOW (ref 36.0–46.0)
HEMOGLOBIN: 10.8 g/dL — AB (ref 12.0–15.0)
MCH: 30.9 pg (ref 26.0–34.0)
MCHC: 34.4 g/dL (ref 30.0–36.0)
MCV: 89.7 fL (ref 78.0–100.0)
Platelets: 291 10*3/uL (ref 150–400)
RBC: 3.5 MIL/uL — AB (ref 3.87–5.11)
RDW: 13.7 % (ref 11.5–15.5)
WBC: 10.6 10*3/uL — ABNORMAL HIGH (ref 4.0–10.5)

## 2016-12-03 LAB — MRSA PCR SCREENING: MRSA by PCR: NEGATIVE

## 2016-12-03 SURGERY — LAPAROTOMY, EXPLORATORY
Anesthesia: General

## 2016-12-03 MED ORDER — ROCURONIUM BROMIDE 10 MG/ML (PF) SYRINGE
PREFILLED_SYRINGE | INTRAVENOUS | Status: DC | PRN
  Administered 2016-12-03: 30 mg via INTRAVENOUS

## 2016-12-03 MED ORDER — ONDANSETRON HCL 4 MG/2ML IJ SOLN: 4.0000 mg | Freq: Four times a day (QID) | INTRAMUSCULAR | Status: DC | PRN

## 2016-12-03 MED ORDER — SUGAMMADEX SODIUM 200 MG/2ML IV SOLN
INTRAVENOUS | Status: DC | PRN
  Administered 2016-12-03: 150 mg via INTRAVENOUS

## 2016-12-03 MED ORDER — CHLORHEXIDINE GLUCONATE CLOTH 2 % EX PADS: 6.0000 | MEDICATED_PAD | Freq: Once | CUTANEOUS | Status: DC

## 2016-12-03 MED ORDER — LIDOCAINE 2% (20 MG/ML) 5 ML SYRINGE
INTRAMUSCULAR | Status: DC | PRN
  Administered 2016-12-03: 60 mg via INTRAVENOUS

## 2016-12-03 MED ORDER — INSULIN ASPART 100 UNIT/ML ~~LOC~~ SOLN
0.0000 [IU] | SUBCUTANEOUS | Status: DC
  Administered 2016-12-03: 3 [IU] via SUBCUTANEOUS
  Administered 2016-12-04: 8 [IU] via SUBCUTANEOUS
  Administered 2016-12-04 (×3): 3 [IU] via SUBCUTANEOUS
  Administered 2016-12-04: 8 [IU] via SUBCUTANEOUS
  Administered 2016-12-04 – 2016-12-05 (×2): 5 [IU] via SUBCUTANEOUS
  Administered 2016-12-05 (×3): 3 [IU] via SUBCUTANEOUS
  Administered 2016-12-05 (×2): 8 [IU] via SUBCUTANEOUS
  Administered 2016-12-06: 5 [IU] via SUBCUTANEOUS
  Administered 2016-12-06 (×2): 3 [IU] via SUBCUTANEOUS
  Administered 2016-12-06 (×3): 5 [IU] via SUBCUTANEOUS
  Administered 2016-12-07: 8 [IU] via SUBCUTANEOUS
  Administered 2016-12-07: 3 [IU] via SUBCUTANEOUS
  Administered 2016-12-07: 5 [IU] via SUBCUTANEOUS
  Administered 2016-12-07: 2 [IU] via SUBCUTANEOUS
  Administered 2016-12-07: 3 [IU] via SUBCUTANEOUS
  Administered 2016-12-07 – 2016-12-08 (×2): 5 [IU] via SUBCUTANEOUS
  Administered 2016-12-08: 11 [IU] via SUBCUTANEOUS
  Administered 2016-12-08: 5 [IU] via SUBCUTANEOUS
  Administered 2016-12-08: 8 [IU] via SUBCUTANEOUS
  Administered 2016-12-09: 5 [IU] via SUBCUTANEOUS
  Administered 2016-12-09: 2 [IU] via SUBCUTANEOUS

## 2016-12-03 MED ORDER — ROCURONIUM BROMIDE 50 MG/5ML IV SOSY
PREFILLED_SYRINGE | INTRAVENOUS | Status: AC
  Filled 2016-12-03: qty 5

## 2016-12-03 MED ORDER — PROPOFOL 10 MG/ML IV BOLUS
INTRAVENOUS | Status: DC | PRN
  Administered 2016-12-03: 130 mg via INTRAVENOUS

## 2016-12-03 MED ORDER — PROPOFOL 10 MG/ML IV BOLUS
INTRAVENOUS | Status: AC
  Filled 2016-12-03: qty 20

## 2016-12-03 MED ORDER — MORPHINE SULFATE (PF) 4 MG/ML IV SOLN
1.0000 mg | INTRAVENOUS | Status: DC | PRN
  Administered 2016-12-03: 2 mg via INTRAVENOUS
  Administered 2016-12-04: 1 mg via INTRAVENOUS
  Administered 2016-12-04: 4 mg via INTRAVENOUS
  Administered 2016-12-04: 1 mg via INTRAVENOUS
  Administered 2016-12-05: 2 mg via INTRAVENOUS
  Administered 2016-12-05 – 2016-12-06 (×2): 4 mg via INTRAVENOUS
  Filled 2016-12-03 (×7): qty 1

## 2016-12-03 MED ORDER — SUGAMMADEX SODIUM 200 MG/2ML IV SOLN
INTRAVENOUS | Status: AC
  Filled 2016-12-03: qty 2

## 2016-12-03 MED ORDER — ENOXAPARIN SODIUM 40 MG/0.4ML ~~LOC~~ SOLN
40.0000 mg | SUBCUTANEOUS | Status: DC
  Administered 2016-12-04 – 2016-12-09 (×6): 40 mg via SUBCUTANEOUS
  Filled 2016-12-03 (×6): qty 0.4

## 2016-12-03 MED ORDER — HYDROMORPHONE HCL 1 MG/ML IJ SOLN
INTRAMUSCULAR | Status: AC
  Administered 2016-12-03: 0.25 mg via INTRAVENOUS
  Filled 2016-12-03: qty 0.5

## 2016-12-03 MED ORDER — FENTANYL CITRATE (PF) 250 MCG/5ML IJ SOLN
INTRAMUSCULAR | Status: AC
  Filled 2016-12-03: qty 5

## 2016-12-03 MED ORDER — DEXAMETHASONE SODIUM PHOSPHATE 10 MG/ML IJ SOLN
INTRAMUSCULAR | Status: DC | PRN
Start: 2016-12-03 — End: 2016-12-03
  Administered 2016-12-03: 10 mg via INTRAVENOUS

## 2016-12-03 MED ORDER — PHENYLEPHRINE 40 MCG/ML (10ML) SYRINGE FOR IV PUSH (FOR BLOOD PRESSURE SUPPORT)
PREFILLED_SYRINGE | INTRAVENOUS | Status: DC | PRN
  Administered 2016-12-03 (×3): 80 ug via INTRAVENOUS

## 2016-12-03 MED ORDER — FENTANYL CITRATE (PF) 100 MCG/2ML IJ SOLN
INTRAMUSCULAR | Status: DC | PRN
  Administered 2016-12-03: 25 ug via INTRAVENOUS
  Administered 2016-12-03 (×4): 50 ug via INTRAVENOUS

## 2016-12-03 MED ORDER — PROMETHAZINE HCL 25 MG/ML IJ SOLN: 6.2500 mg | INTRAMUSCULAR | Status: DC | PRN

## 2016-12-03 MED ORDER — POTASSIUM CHLORIDE 10 MEQ/100ML IV SOLN
10.0000 meq | INTRAVENOUS | Status: AC
  Administered 2016-12-03 (×4): 10 meq via INTRAVENOUS
  Filled 2016-12-03 (×4): qty 100

## 2016-12-03 MED ORDER — KCL IN DEXTROSE-NACL 20-5-0.45 MEQ/L-%-% IV SOLN
INTRAVENOUS | Status: AC
  Administered 2016-12-03: 1000 mL via INTRAVENOUS
  Filled 2016-12-03: qty 1000

## 2016-12-03 MED ORDER — PANTOPRAZOLE SODIUM 40 MG IV SOLR
40.0000 mg | Freq: Every day | INTRAVENOUS | Status: DC
  Administered 2016-12-03 – 2016-12-08 (×6): 40 mg via INTRAVENOUS
  Filled 2016-12-03 (×7): qty 40

## 2016-12-03 MED ORDER — KCL IN DEXTROSE-NACL 20-5-0.45 MEQ/L-%-% IV SOLN
INTRAVENOUS | Status: DC
  Administered 2016-12-03: 1000 mL via INTRAVENOUS
  Administered 2016-12-04 – 2016-12-06 (×6): via INTRAVENOUS
  Administered 2016-12-07: 1 mL via INTRAVENOUS
  Administered 2016-12-08: 1000 mL via INTRAVENOUS
  Administered 2016-12-09: 01:00:00 via INTRAVENOUS
  Filled 2016-12-03 (×17): qty 1000

## 2016-12-03 MED ORDER — SUCCINYLCHOLINE CHLORIDE 200 MG/10ML IV SOSY
PREFILLED_SYRINGE | INTRAVENOUS | Status: AC
  Filled 2016-12-03: qty 10

## 2016-12-03 MED ORDER — 0.9 % SODIUM CHLORIDE (POUR BTL) OPTIME
TOPICAL | Status: DC | PRN
  Administered 2016-12-03: 2000 mL

## 2016-12-03 MED ORDER — SUCCINYLCHOLINE CHLORIDE 200 MG/10ML IV SOSY
PREFILLED_SYRINGE | INTRAVENOUS | Status: DC | PRN
  Administered 2016-12-03: 100 mg via INTRAVENOUS

## 2016-12-03 MED ORDER — ONDANSETRON 4 MG PO TBDP
4.0000 mg | ORAL_TABLET | Freq: Four times a day (QID) | ORAL | Status: DC | PRN
  Filled 2016-12-03: qty 1

## 2016-12-03 MED ORDER — ONDANSETRON HCL 4 MG/2ML IJ SOLN
INTRAMUSCULAR | Status: AC
  Filled 2016-12-03: qty 2

## 2016-12-03 MED ORDER — CHLORHEXIDINE GLUCONATE CLOTH 2 % EX PADS
6.0000 | MEDICATED_PAD | Freq: Once | CUTANEOUS | Status: DC
  Administered 2016-12-03: 6 via TOPICAL

## 2016-12-03 MED ORDER — ONDANSETRON HCL 4 MG/2ML IJ SOLN
INTRAMUSCULAR | Status: DC | PRN
  Administered 2016-12-03: 4 mg via INTRAVENOUS

## 2016-12-03 MED ORDER — LIDOCAINE 2% (20 MG/ML) 5 ML SYRINGE
INTRAMUSCULAR | Status: AC
  Filled 2016-12-03: qty 5

## 2016-12-03 MED ORDER — LACTATED RINGERS IV SOLN
INTRAVENOUS | Status: DC | PRN
  Administered 2016-12-03: 14:00:00 via INTRAVENOUS

## 2016-12-03 MED ORDER — HYDROMORPHONE HCL 1 MG/ML IJ SOLN
0.2500 mg | INTRAMUSCULAR | Status: DC | PRN
  Administered 2016-12-03 (×3): 0.25 mg via INTRAVENOUS

## 2016-12-03 SURGICAL SUPPLY — 36 items
APPLICATOR COTTON TIP 6IN STRL (MISCELLANEOUS) ×2 IMPLANT
BLADE EXTENDED COATED 6.5IN (ELECTRODE) IMPLANT
BLADE HEX COATED 2.75 (ELECTRODE) ×2 IMPLANT
CHLORAPREP W/TINT 26ML (MISCELLANEOUS) ×2 IMPLANT
COVER MAYO STAND STRL (DRAPES) IMPLANT
COVER SURGICAL LIGHT HANDLE (MISCELLANEOUS) ×2 IMPLANT
DRAPE LAPAROSCOPIC ABDOMINAL (DRAPES) ×2 IMPLANT
DRAPE WARM FLUID 44X44 (DRAPE) IMPLANT
ELECT REM PT RETURN 15FT ADLT (MISCELLANEOUS) ×2 IMPLANT
GAUZE SPONGE 4X4 12PLY STRL (GAUZE/BANDAGES/DRESSINGS) ×2 IMPLANT
GLOVE BIOGEL PI IND STRL 7.0 (GLOVE) ×1 IMPLANT
GLOVE BIOGEL PI INDICATOR 7.0 (GLOVE) ×1
GLOVE SURG ORTHO 8.0 STRL STRW (GLOVE) ×2 IMPLANT
GOWN STRL REUS W/TWL LRG LVL3 (GOWN DISPOSABLE) ×2 IMPLANT
GOWN STRL REUS W/TWL XL LVL3 (GOWN DISPOSABLE) ×4 IMPLANT
HANDLE SUCTION POOLE (INSTRUMENTS) IMPLANT
KIT BASIN OR (CUSTOM PROCEDURE TRAY) ×2 IMPLANT
NS IRRIG 1000ML POUR BTL (IV SOLUTION) ×2 IMPLANT
PACK GENERAL/GYN (CUSTOM PROCEDURE TRAY) ×2 IMPLANT
SPONGE LAP 18X18 X RAY DECT (DISPOSABLE) ×2 IMPLANT
STAPLER VISISTAT 35W (STAPLE) ×2 IMPLANT
SUCTION POOLE HANDLE (INSTRUMENTS)
SUT NOV 1 T60/GS (SUTURE) IMPLANT
SUT NOVA NAB GS-21 1 T12 (SUTURE) ×4 IMPLANT
SUT SILK 2 0 (SUTURE)
SUT SILK 2 0 SH CR/8 (SUTURE) IMPLANT
SUT SILK 2-0 18XBRD TIE 12 (SUTURE) IMPLANT
SUT SILK 3 0 (SUTURE)
SUT SILK 3 0 SH CR/8 (SUTURE) IMPLANT
SUT SILK 3-0 18XBRD TIE 12 (SUTURE) IMPLANT
SUT VICRYL 2 0 18  UND BR (SUTURE)
SUT VICRYL 2 0 18 UND BR (SUTURE) IMPLANT
TOWEL OR 17X26 10 PK STRL BLUE (TOWEL DISPOSABLE) ×2 IMPLANT
TRAY FOLEY W/METER SILVER 16FR (SET/KITS/TRAYS/PACK) IMPLANT
WATER STERILE IRR 1500ML POUR (IV SOLUTION) ×2 IMPLANT
YANKAUER SUCT BULB TIP NO VENT (SUCTIONS) IMPLANT

## 2016-12-03 NOTE — Op Note (Signed)
[name] [name], KLEPACKI               ACCOUNT NO.:  [id number]  MEDICAL RECORD NO.:  [id number]  LOCATION:  VW09                         FACILITY:  [facility]  PHYSICIAN:  [name], MD      DATE OF BIRTH:  1934-01-28  DATE OF PROCEDURE:  12/03/2016                              OPERATIVE REPORT   PREOPERATIVE DIAGNOSIS:  Small bowel obstruction.  POSTOPERATIVE DIAGNOSIS:  Small bowel obstruction secondary to adhesions.  PROCEDURE:  Exploratory laparotomy with lysis of adhesions.  SURGEON:  [name], MD  ANESTHESIA:  General.  ESTIMATED BLOOD LOSS:  Minimal.  PREPARATION:  ChloraPrep.  COMPLICATIONS:  None.  INDICATIONS:  The patient is an 83 year old female who developed abdominal pain and distention.  CT scan showed dilated loops of small bowel consistent with small bowel obstruction.  The patient was evaluated by General Surgery and noted to have a distended abdomen with tenderness.  This failed to improve with 24 hours of nasogastric decompression.  The patient was therefore prepared and brought to the operating room for exploration.  BODY OF REPORT:  Procedure is done in OR #1 at the [facility].  The patient is brought to the operating room, placed in a supine position on the operating room table.  Following administration of general anesthesia, the patient is positioned and then prepped and draped in the usual aseptic fashion.  After ascertaining that an adequate level of anesthesia had been achieved, a midline abdominal incision was made with a #10 blade.  Dissection is carried through subcutaneous tissues.  Fascia is incised in the midline and the peritoneal cavity is entered cautiously.  750 mL of serous fluid is evacuated from the peritoneal cavity.  The small bowel appears viable. Incision is extended cephalad and caudad.  The patient is eviscerated. There is a single adhesive band in the retroperitoneum on the left side just at the pelvic  brim.  There is an entrapped loop of small bowel which is completely obstructed.  Using the Metzenbaum scissors, the band is lysed releasing the small bowel.  The small bowel was then run from the ligament of Treitz to the ileocecal valve.  There is no other gross abnormality other than distention.  Sigmoid colon and descending colon appear normal.  Cecum appears normal.  Ascitic fluid is evacuated.  The small bowel is then placed back into the peritoneal cavity in its normal orientation. Omentum is used to cover the small bowel.  Midline incision was closed with running #1 Novafil sutures. Subcutaneous tissues are irrigated.  Hemostasis is achieved with the electrocautery.  Skin is closed with stainless steel staples.  The patient is awakened from anesthesia and brought to the recovery room. The patient tolerated the procedure well.   [name], MD, [facility] Office: [phone]    TMG/MEDQ  D:  12/03/2016  T:  12/03/2016  Job:  [id number]

## 2016-12-03 NOTE — Anesthesia Preprocedure Evaluation (Addendum)
Anesthesia Evaluation  Patient identified by MRN, date of birth, ID band Patient awake    Reviewed: Allergy & Precautions, NPO status , Patient's Chart, lab work & pertinent test results  Airway Mallampati: II  TM Distance: >3 FB Neck ROM: Full    Dental  (+) Edentulous Upper, Edentulous Lower   Pulmonary neg pulmonary ROS,    breath sounds clear to auscultation       Cardiovascular hypertension, Pt. on medications  Rhythm:Regular Rate:Normal     Neuro/Psych negative neurological ROS     GI/Hepatic Neg liver ROS, GERD  ,SBO   Endo/Other  diabetesHypothyroidism   Renal/GU Renal disease     Musculoskeletal  (+) Arthritis ,   Abdominal   Peds  Hematology negative hematology ROS (+)   Anesthesia Other Findings   Reproductive/Obstetrics                            Lab Results  Component Value Date   WBC 10.6 (H) 12/03/2016   HGB 10.8 (L) 12/03/2016   HCT 31.4 (L) 12/03/2016   MCV 89.7 12/03/2016   PLT 291 12/03/2016   Lab Results  Component Value Date   CREATININE 0.72 12/03/2016   BUN 9 12/03/2016   NA 129 (L) 12/03/2016   K 3.3 (L) 12/03/2016   CL 94 (L) 12/03/2016   CO2 25 12/03/2016    Anesthesia Physical Anesthesia Plan  ASA: III and emergent  Anesthesia Plan: General   Post-op Pain Management:    Induction: Intravenous and Rapid sequence  Airway Management Planned: Oral ETT  Additional Equipment:   Intra-op Plan:   Post-operative Plan: Extubation in OR  Informed Consent: I have reviewed the patients History and Physical, chart, labs and discussed the procedure including the risks, benefits and alternatives for the proposed anesthesia with the patient or authorized representative who has indicated his/her understanding and acceptance.   Dental advisory given  Plan Discussed with: CRNA  Anesthesia Plan Comments:        Anesthesia Quick Evaluation

## 2016-12-03 NOTE — Progress Notes (Signed)
Initial Nutrition Assessment  DOCUMENTATION CODES:   Severe malnutrition in context of acute illness/injury  INTERVENTION:   -Diet advancement per MD -If unable to have diet advanced, need to consider nutrition support given poor PO x 8 days. -RD to continue to monitor plan  NUTRITION DIAGNOSIS:   Malnutrition (Severe) related to acute illness (SBO) as evidenced by energy intake < or equal to 50% for > or equal to 5 days, moderate depletion of body fat, moderate depletions of muscle mass.  GOAL:   Patient will meet greater than or equal to 90% of their needs  MONITOR:   Diet advancement, Labs, Weight trends, I & O's  REASON FOR ASSESSMENT:   LOS (NPO)    ASSESSMENT:   83 y.o.femalewith a past medical history significant for IDDM, hypothyroidism and HTNwho presents with 2 days abdominal pain, malaise and now confusion.  Patient in room with daughter at bedside. Pt HOH, pt's daughter provided most history. Per daughter, pt was eating well, around 5-6 small meals a day. Pt tries to consume protein foods and eat healthy to control her blood sugar at home. Pt has seen a "nutritionist" in the past. Pt has only consumed up to full liquids during this admission, has been NPO since 5/14. Was initially NPO from 5/9-5/11 when first admitted. SBO was found via CT scan 5/15. Now pt with NGT to suction, output noted at bedside: 500 ml.  Pt has not moved much since admission and noticeably seems weaker to pt's daughter.  Per chart review, pt has lost 13 lb since July 2017 (9% wt loss x 10 month, insignificant for time frame). Nutrition-Focused physical exam completed. Findings are moderate fat depletion, moderate muscle depletion, and no edema.   Medications: IV Protonix daily, D5 -.45% NaCl infusion at 75 ml/hr -provides 306 kcal Labs reviewed: CBGs: 128-140 Low Na, K  Diet Order:  Diet - low sodium heart healthy Diet NPO time specified  Skin:  Reviewed, no issues  Last BM:   5/10  Height:   Ht Readings from Last 1 Encounters:  11/29/16 5\' 7"  (1.702 m)    Weight:   Wt Readings from Last 1 Encounters:  11/29/16 137 lb (62.1 kg)    Ideal Body Weight:  61.4 kg  BMI:  Body mass index is 21.46 kg/m.  Estimated Nutritional Needs:   Kcal:  1500-1700  Protein:  70-80g  Fluid:  1.7L/day  EDUCATION NEEDS:   No education needs identified at this time  Clayton Bibles, MS, RD, LDN Pager: [phone] After Hours Pager: [phone]

## 2016-12-03 NOTE — Progress Notes (Signed)
Patient ID: [name] [name], female   DOB: October 09, 1933, 83 y.o.   MRN: [id number]  [facility]  AXR without improvement.  Discussed with Dr. Ree Kida.  Recommend proceeding with exploratory laparotomy for SBO.  May require partial bowel resection.  Possible ostomy.  Discussed with patient and family at bedside.  The risks and benefits of the procedure have been discussed at length with the patient.  The patient understands the proposed procedure, potential alternative treatments, and the course of recovery to be expected.  All of the patient's questions have been answered at this time.  The patient wishes to proceed with surgery.  [name], MD, [facility] Office: [phone]

## 2016-12-03 NOTE — Transfer of Care (Signed)
Immediate Anesthesia Transfer of Care Note  Patient: [name]  Procedure(s) Performed: Procedure(s): EXPLORATORY LAPAROTOMY, LYSIS OF ADHESIONS (N/A)  Patient Location: PACU  Anesthesia Type:General  Level of Consciousness: awake and alert   Airway & Oxygen Therapy: Patient Spontanous Breathing and Patient connected to face mask oxygen  Post-op Assessment: Report given to RN and Post -op Vital signs reviewed and stable  Post vital signs: Reviewed and stable  Last Vitals:  Vitals:   12/03/16 0650 12/03/16 1324  BP: (!) 140/58 (!) 157/64  Pulse: 94 85  Resp: 17 16  Temp: 37.2 C 37.2 C    Last Pain:  Vitals:   12/03/16 1324  TempSrc: Oral  PainSc:       Patients Stated Pain Goal: 0 (57/49/35 5217)  Complications: No apparent anesthesia complications

## 2016-12-03 NOTE — Anesthesia Procedure Notes (Signed)
Procedure Name: Intubation Date/Time: 12/03/2016 2:47 PM Performed by: Danley Danker L Patient Re-evaluated:Patient Re-evaluated prior to inductionOxygen Delivery Method: Circle system utilized Preoxygenation: Pre-oxygenation with 100% oxygen Intubation Type: IV induction, Rapid sequence and Cricoid Pressure applied Laryngoscope Size: Miller and 2 Grade View: Grade I Tube type: Oral Tube size: 7.5 mm Number of attempts: 1 Airway Equipment and Method: Stylet Placement Confirmation: ETT inserted through vocal cords under direct vision,  positive ETCO2 and breath sounds checked- equal and bilateral Secured at: 21 cm Tube secured with: Tape Dental Injury: Teeth and Oropharynx as per pre-operative assessment

## 2016-12-03 NOTE — Brief Op Note (Signed)
11/25/2016 - 12/03/2016  3:44 PM  PATIENT:  [name] [name]  83 y.o. female  PRE-OPERATIVE DIAGNOSIS:  small bowel obstruction  POST-OPERATIVE DIAGNOSIS:  small bowel obstruction secondary to adhesions  PROCEDURE:  Procedure(s): EXPLORATORY LAPAROTOMY, LYSIS OF ADHESIONS (N/A)  SURGEON:  Surgeon(s) and Role:    * Armandina Gemma, MD - Primary  ASSISTANTS: Traci Pozil, RN   ANESTHESIA:   general  EBL:  Total I/O In: 60 [P.O.:60] Out: 4469 [Urine:375; Emesis/NG output:300; Other:1000; Blood:50]  BLOOD ADMINISTERED:none  DRAINS: none   LOCAL MEDICATIONS USED:  NONE  SPECIMEN:  No Specimen  DISPOSITION OF SPECIMEN:  N/A  COUNTS:  YES  TOURNIQUET:  * No tourniquets in log *  DICTATION: .Other Dictation: Dictation Number [id number]  PLAN OF CARE: Admit to inpatient   PATIENT DISPOSITION:  PACU - hemodynamically stable.   Delay start of Pharmacological VTE agent (>24hrs) due to surgical blood loss or risk of bleeding: yes  [name], MD, [facility] Office: [phone]

## 2016-12-03 NOTE — Clinical Social Work Note (Signed)
Clinical Social Work Assessment  Patient Details  Name: [name] [name] MRN: [id number] Date of Birth: 1933/10/04  Date of referral:  12/03/16               Reason for consult:  Facility Placement                Permission sought to share information with:  Family Supports Permission granted to share information::  Yes, Verbal Permission Granted  Name::     daughters [name] [name] and [name] [name]::     Relationship::     Contact Information:  [phone], [phone]  Housing/Transportation Living arrangements for the past 2 months:  Single Family Home (alone) Source of Information:  Patient, Adult Children, Medical Team Patient Interpreter Needed:  None Criminal Activity/Legal Involvement Pertinent to Current Situation/Hospitalization:  No - Comment as needed Significant Relationships:  Adult Children Lives with:  Self Do you feel safe going back to the place where you live?  Yes Need for family participation in patient care:  (S) Yes (Comment) (daughters involved in decision making)  Care giving concerns:  Pt from home where she resides alone. At baseline is independent with mobility, ADLs. Currently requiring assistance and due to living alone and daughters work schedules, would be unable to provide 24/7 assistance.  Daughters hesitant about SNF due to past experiences with their father in nursing facilities. Want to pursue rehab but are undecided about final DC plan.    Social Worker assessment / plan: \CSW consulted for potential ST SNF placement. Met with pt and daughters at bedside. Pt minimally interactive with CSW, majority of assessment completed with information from daughters. CSW explained role. Daughters understanding of PT recommendations and report being familiar with placement process from past experience with family members. Express reservations about SNF placement but ultimately agreed to referrals and they will review/visit facilities after obtaining bed offers. At  this time most interested in [place] due to its location and pt's insurance.  CSW obtained PASSR and completed FL2. Referred to facilities. Will need [facility] authorization once facility decided on by family.  Plan: refer to SNF for ST rehab. Will follow up with daughters for bed offers.   Employment status:  Retired Nurse, adult PT Recommendations:  [place], [place] / Referral to community resources:  [place]  Patient/Family's Response to care:  Appreciative of care  Patient/Family's Understanding of and Emotional Response to Diagnosis, Current Treatment, and Prognosis:  Daughters demonstrate adequate understanding of plan and potential barriers.   Emotional Assessment Appearance:  Appears stated age Attitude/Demeanor/Rapport:   (minimal interaction, drowsy) Affect (typically observed):  Calm Orientation:  Oriented to Self, Oriented to Place Alcohol / Substance use:  Not Applicable Psych involvement (Current and /or in the community):  No (Comment)  Discharge Needs  Concerns to be addressed:  Discharge Planning Concerns Readmission within the last 30 days:  No Current discharge risk:  Dependent with Mobility Barriers to Discharge:  Continued Medical Work up, [employer], LCSW 12/03/2016, 11:36 AM  [phone]

## 2016-12-03 NOTE — Anesthesia Postprocedure Evaluation (Signed)
Anesthesia Post Note  Patient: [name] [name]  Procedure(s) Performed: Procedure(s) (LRB): EXPLORATORY LAPAROTOMY, LYSIS OF ADHESIONS (N/A)  Patient location during evaluation: PACU Anesthesia Type: General Level of consciousness: awake and alert Pain management: pain level controlled Vital Signs Assessment: post-procedure vital signs reviewed and stable Respiratory status: spontaneous breathing, nonlabored ventilation, respiratory function stable and patient connected to nasal cannula oxygen Cardiovascular status: blood pressure returned to baseline and stable Postop Assessment: no signs of nausea or vomiting Anesthetic complications: no       Last Vitals:  Vitals:   12/03/16 1645 12/03/16 1700  BP: (!) 155/76 (!) 168/75  Pulse: 87 87  Resp: 12 14  Temp:  37.2 C    Last Pain:  Vitals:   12/03/16 1556  TempSrc:   PainSc: 0-No pain                 Tiajuana Amass

## 2016-12-03 NOTE — Progress Notes (Signed)
General Surgery [facility]  Assessment & Plan: Small bowel obstruction  NG decompression, NPO, IV hydration  Will check AXR this AM  May require laparotomy if no improvement on AXR today  Discussed with patient and family         [name] Regal, MD, [facility]       Office: [phone]    Chief Complaint: Small bowel obstruction   Subjective: Patient in bed, family at bedside.  Complains of mild pain.  No flatus or BM.  Objective: Vital signs in last 24 hours: Temp:  [98.9 F (37.2 C)-99.2 F (37.3 C)] 98.9 F (37.2 C) (05/17 0650) Pulse Rate:  [90-94] 94 (05/17 0650) Resp:  [16-17] 17 (05/17 0650) BP: (140-144)/(58-63) 140/58 (05/17 0650) SpO2:  [92 %-95 %] 92 % (05/17 0650) Last BM Date: 11/26/16  Intake/Output from previous day: 05/16 0701 - 05/17 0700 In: 2008.8 [I.V.:1908.8; IV Piggyback:100] Out: 1350 [Urine:900; Emesis/NG output:450] Intake/Output this shift: Total I/O In: 40 [P.O.:60] Out: 300 [Emesis/NG output:300]  Physical Exam: HEENT - sclerae clear, mucous membranes moist Neck - soft Chest - clear bilaterally Cor - RRR Abdomen - mild distension; BS present; mild diffuse tenderness; no hernia or mass Ext - no edema, non-tender Neuro - alert & oriented, no focal deficits  Lab Results:   Recent Labs  12/01/16 1718 12/03/16 0348  WBC 9.8 10.6*  HGB 12.2 10.8*  HCT 36.1 31.4*  PLT 354 291   BMET  Recent Labs  12/01/16 1718 12/03/16 0348  NA 128* 129*  K 3.5 3.3*  CL 92* 94*  CO2 24 25  GLUCOSE 164* 148*  BUN 10 9  CREATININE 0.76 0.72  CALCIUM 9.2 8.5*   PT/INR No results for input(s): LABPROT, INR in the last 72 hours. Comprehensive Metabolic Panel:    Component Value Date/Time   NA 129 (L) 12/03/2016 0348   NA 128 (L) 12/01/2016 1718   K 3.3 (L) 12/03/2016 0348   K 3.5 12/01/2016 1718   CL 94 (L) 12/03/2016 0348   CL 92 (L) 12/01/2016 1718   CO2 25 12/03/2016 0348    CO2 24 12/01/2016 1718   BUN 9 12/03/2016 0348   BUN 10 12/01/2016 1718   CREATININE 0.72 12/03/2016 0348   CREATININE 0.76 12/01/2016 1718   CREATININE 0.98 09/25/2014 1654   GLUCOSE 148 (H) 12/03/2016 0348   GLUCOSE 164 (H) 12/01/2016 1718   CALCIUM 8.5 (L) 12/03/2016 0348   CALCIUM 9.2 12/01/2016 1718   AST 19 11/28/2016 0403   AST 27 11/25/2016 1506   ALT 16 11/28/2016 0403   ALT 29 11/25/2016 1506   ALKPHOS 53 11/28/2016 0403   ALKPHOS 71 11/25/2016 1506   BILITOT 0.6 11/28/2016 0403   BILITOT 1.1 11/25/2016 1506   PROT 6.0 (L) 11/28/2016 0403   PROT 7.4 11/25/2016 1506   ALBUMIN 3.1 (L) 11/28/2016 0403   ALBUMIN 4.2 11/25/2016 1506    Studies/Results: Dg Abd 1 View  Result Date: 12/01/2016 CLINICAL DATA:  83 year old female with nasogastric tube placement. Ascites. Subsequent encounter. EXAM: ABDOMEN - 1 VIEW COMPARISON:  12/01/2016 CT. FINDINGS: Nasogastric tube with tip and side hole gastric fundus level. Small bowel obstructive pattern with gas distended small bowel loops with thickened folds as noted on recent CT. IMPRESSION: Nasogastric tube with tip and side hole gastric fundus level. Small bowel obstructive pattern. Electronically Signed   By: [name] StevensonD.   On:  12/01/2016 17:15   Ct Abdomen Pelvis W Contrast  Result Date: 12/01/2016 CLINICAL DATA:  confusion, malaise and abdominal pain. Sepsis, abdominal pain. EXAM: CT ABDOMEN AND PELVIS WITH CONTRAST TECHNIQUE: Multidetector CT imaging of the abdomen and pelvis was performed using the standard protocol following bolus administration of intravenous contrast. CONTRAST:  12mL ISOVUE-300 IOPAMIDOL (ISOVUE-300) INJECTION 61%, 65mL ISOVUE-300 IOPAMIDOL (ISOVUE-300) INJECTION 61% COMPARISON:  CT abdomen dated 11/25/2016. FINDINGS: Lower chest: Bilateral pleural effusions, at least moderate in size, incompletely imaged, with adjacent compressive atelectasis. Hepatobiliary: No focal liver abnormality is seen. No  gallstones, gallbladder wall thickening, or biliary dilatation. Pancreas: Unremarkable. No pancreatic ductal dilatation or surrounding inflammatory changes. Spleen: Normal in size without focal abnormality. Adrenals/Urinary Tract: Adrenal glands appear normal. Left renal cyst. Kidneys otherwise unremarkable without stone or hydronephrosis. No ureteral or bladder calculi identified. Bladder is decompressed by Foley catheter. Stomach/Bowel: Diffusely dilated fluid-filled small bowel throughout the abdomen and pelvis. Two possible transition zones identified, left and right hemipelvis, with decompression of the distal small bowel (series 2, images 69 and 70 ; series 2, images 63 through 65). Colon is diffusely decompressed indicating complete versus high-grade partial small bowel obstruction. Vascular/Lymphatic: Aortic atherosclerosis. No enlarged abdominal or pelvic lymph nodes. Reproductive: Status post hysterectomy. Other: Moderate amount of free fluid in the abdomen and pelvis. No circumscribed abscess collection identified. No free intraperitoneal air seen. Musculoskeletal: Degenerative changes throughout the thoracolumbar spine, mild to moderate in degree. No acute or suspicious osseous finding. Diffuse ill-defined fluid/edema within the subcutaneous soft tissues of the abdomen and pelvis, consistent with anasarca. IMPRESSION: 1. Small-bowel obstruction, with diffusely dilated fluid-filled small bowel throughout the abdomen and pelvis, with decompressed distal small bowel and colon, consistent with complete small bowel obstruction versus high-grade partial obstruction. Two possible sites of obstruction within the pelvis raising the possibility of closed loop obstruction. 2. Moderate amount of ascites within the abdomen and pelvis. No abscess collection seen. 3. Bilateral pleural effusions, at least moderate in size, incompletely imaged. 4. Anasarca. 5. Aortic atherosclerosis. These results were called by  telephone at the time of interpretation on 12/01/2016 at 3:32 pm to Dr. Sander Radon , who verbally acknowledged these results. Electronically Signed   By: [name] [name] StevensonD.   On: 12/01/2016 15:36   Dg Abd 2 Views  Result Date: 12/02/2016 CLINICAL DATA:  Follow-up exam for small bowel obstruction. EXAM: ABDOMEN - 2 VIEW COMPARISON:  Comparison made with CT and radiograph from 12/01/2016. FINDINGS: NG tube in place with tip in side hole overlying the stomach. Persistent dilated air-filled loops of bowel seen scattered throughout the mid abdomen, measuring up to 4 cm in diameter. Associated air-fluid levels present on lateral decubitus view. Findings consistent with ongoing obstruction. Overall, appearance not significantly changed. Loops of bowel are centralized within the abdomen, like related to previously seen ascites. No other new abnormality.  No free air. Visualized osseous structures unchanged. IMPRESSION: 1. Dilated loops of small bowel clustered within the mid abdomen with associated internal air-fluid levels, consistent with persistent small-bowel obstruction. Overall, appearance not significantly changed relative to most recent radiograph from 12/01/2016. NG tube in place within the stomach. 2. Centralization of loops of bowel within the abdomen, consistent with previously seen ascites. Electronically Signed   By: Jeannine Boga StevensonD.   On: 12/02/2016 17:07      [name] [name] M 12/03/2016  Patient ID: [name], female   DOB: 17-Jun-1934, 83 y.o.   MRN: [id number]

## 2016-12-03 NOTE — Progress Notes (Signed)
PROGRESS NOTE    [name] [name]  RUE:[id number] DOB: [birth year] DOA: 11/25/2016 PCP: Aura Dials, MD   Chief Complaint  Patient presents with  . Altered Mental Status  . Hematuria    Brief Narrative:  HPI On 11/25/2016 by Dr. Myrene Buddy [name] [name] is a 83 y.o. female with a past medical history significant for IDDM, hypothyroidism and HTN who presents with 2 days abdominal pain, malaise and now confusion. All history is collected from the patient's daughter, as the patient is obtunded. Per daughter on Monday patient saw her primary doctor who decreased her insulin because of night time lows or highs (she is not sure). Then Tuesday evening on the phone, the patient seemed a little bit "agitated" or "tired" and later that night she called her daughter complaining of some abdominal discomfort, "upset stomach", and asked to be taken to the emergency room.  In the ER, she was quite hyperglycemic but bicarb and gap normal, no leukocytosis, normal renal function.  Lipase elevated but CT unremarkable except moderate ascites.  It seems she felt better and was prepared for discharge, only out of the ordinary finding that she was given a foley bag for retained urine. Per daughter, she slep this morning, and then in the afternoon got up and was altered, totally confused, pulled out her foley and had some gross hematuria (at baseline she lives alone, drives, does her finances online, is totally independent, manages her own meds).  Daughter is aware of no fever, cough, sputum production, dyrsuria, urinary irritation, recent diarrhea (although she has episodes from time to time).  Interim history Patient admitted with sepsis and DKA. Was also found to have acute urinary retention, Foley catheter was placed, failed voiding trial. Patient was also noted to have ileus with bowel traction. General surgery consulted.  Assessment & Plan   DKA/diabetes mellitus, type II -It was thought that this  was a due to patient decreasing her insulin over the past week prior to admission, vs infection -patient was on metformin at home -Resolved, patient did require glucose stabilizer however was transitioned to levemir and ISS  Sepsis secondary to urinary tract infection -Patient didn't have leukocytosis, low-grade temperature, elevated lactic acid, on admission. -Lactic acidosis was thought to be exacerbated by metformin -Patient was started on vancomycin and Zosyn, currently on ceftriaxone- has received 7 days of antibiotic coverage -Will discontinue today -urine culture showed multiple species, subsequent urine culture showed no growth -Blood culture showed no growth to date  Urinary retention -Required Foley catheter placement, patient did fail a voiding trial -Will likely be discharged with Foley catheter in place and outpatient urology follow up  Acute kidney injury with hyperkalemia -Creatinine was 1.57 on admission, currently 0.72 -Resolved with IV fluids -Hyperkalemia resolved- patient hypokalemic  Hypokalemia -Will replace IV given that patient is NPO  -Monitor BMP  Metabolic encephalopathy -Appears to be at baseline per daughter at bedside  Hypothyroidism -Continue Synthroid  Dyslipidemia -Continue statin when tolerating PO   Abdominal pain/Small bowel obstruction -Upon admission, patient did have elevated lipase at 77 which digested pancreatitis however CT scan was normal -Suspected cause was DKA -CT abdomen and pelvis was done on 11/25/2016 showing moderate diffuse abdominal and pelvic ascites, possible gastric wall thickening which could be under distention. No evidence of bowel obstruction -CT abdomen and pelvis on 12/01/2016 showed small bowel obstruction with diffusely dilated fluid-filled small bowel throughout the abdomen and pelvis, 2 possible sites of obstruction and the pelvis raising possibility of  closed loop obstruction. Moderate amount of ascites in the  abdomen and pelvis -Patient does have NG tube in place -Continue IV fluids, antiemetics -Gen. surgery consulted and appreciated, pending further recommendations, may need laparotomy. -Abdominal Xray today shows persistent unchanged SBO.  Elevated troponin -Possible demand ischemia from DKA, sepsis -Troponin up to 0.44 -Echocardiogram showed EF of 65-70%, no regional wall motion abnormalities, grade 1 diastolic dysfunction   Essential hypertension -continue IV hydralazine as needed  DVT Prophylaxis  lovenox  Code Status: Full  Family Communication: Family at bedside  Disposition Plan: Admitted. Pending further general surgery recommendations- may need laparotomy  Consultants General surgery  Procedures  Echocardiogram  Antibiotics   Anti-infectives    Start     Dose/Rate Route Frequency Ordered Stop   12/01/16 1200  cefTRIAXone (ROCEPHIN) 1 g in dextrose 5 % 50 mL IVPB     1 g 100 mL/hr over 30 Minutes Intravenous Every 24 hours 12/01/16 1021     11/30/16 2200  cephALEXin (KEFLEX) capsule 500 mg  Status:  Discontinued     500 mg Oral Every 12 hours 11/30/16 1141 12/01/16 1021   11/30/16 0000  cephALEXin (KEFLEX) 500 MG capsule     500 mg Oral Every 12 hours 11/30/16 1221 12/03/16 2359   11/26/16 1800  vancomycin (VANCOCIN) IVPB 750 mg/150 ml premix  Status:  Discontinued     750 mg 150 mL/hr over 60 Minutes Intravenous Every 24 hours 11/25/16 1826 11/26/16 0819   11/26/16 1000  cefTRIAXone (ROCEPHIN) 1 g in dextrose 5 % 50 mL IVPB  Status:  Discontinued     1 g 100 mL/hr over 30 Minutes Intravenous Every 24 hours 11/26/16 0819 11/30/16 1141   11/26/16 0000  piperacillin-tazobactam (ZOSYN) IVPB 3.375 g  Status:  Discontinued     3.375 g 12.5 mL/hr over 240 Minutes Intravenous Every 8 hours 11/25/16 1826 11/26/16 0819   11/25/16 1815  vancomycin (VANCOCIN) IVPB 1000 mg/200 mL premix     1,000 mg 200 mL/hr over 60 Minutes Intravenous  Once 11/25/16 1807 11/25/16 1926    11/25/16 1815  piperacillin-tazobactam (ZOSYN) IVPB 3.375 g     3.375 g 100 mL/hr over 30 Minutes Intravenous  Once 11/25/16 1807 11/25/16 1919      Subjective:   Riesa Pope seen and examined today. Has mild nausea at times. Patient does have mild abdominal pain. Denies any bowel movements. Currently denies any chest pain or shortness of breath, dizziness or headache.  Objective:   Vitals:   12/01/16 2139 12/02/16 0513 12/02/16 1545 12/03/16 0650  BP: (!) 162/63 (!) 154/69 (!) 144/63 (!) 140/58  Pulse: 87 (!) 101 90 94  Resp: 18 20 16 17   Temp: 98.3 F (36.8 C) 98 F (36.7 C) 99.2 F (37.3 C) 98.9 F (37.2 C)  TempSrc: Oral Oral Oral Oral  SpO2: 100% 96% 95% 92%  Weight:      Height:        Intake/Output Summary (Last 24 hours) at 12/03/16 1244 Last data filed at 12/03/16 0717  Gross per 24 hour  Intake           793.75 ml  Output              900 ml  Net          -106.25 ml   Filed Weights   11/25/16 1757 11/26/16 0017 11/29/16 1107  Weight: 63.5 kg (140 lb) 61.1 kg (134 lb 11.2 oz) 62.1 kg (137 lb)  Exam  General: Well developed, well nourished, NAD, appears stated age  69: NCAT, mucous membranes moist. NG tube in place  Cardiovascular: S1 S2 auscultated, no rubs, murmurs or gallops. Regular rate and rhythm.  Respiratory: Clear to auscultation bilaterally with equal chest rise  Abdomen: Soft, mild distension, diffusely TTP, + bowel sounds-hypoactive  Extremities: warm dry without cyanosis clubbing or edema  Neuro: AAOx3, nonfocal  Psych: Normal affect and demeanor   Data Reviewed: I have personally reviewed following labs and imaging studies  CBC:  Recent Labs Lab 11/27/16 0320 11/28/16 0403 11/29/16 0404 12/01/16 1718 12/03/16 0348  WBC 8.8 9.1 8.9 9.8 10.6*  NEUTROABS 6.9 7.4 7.4  --   --   HGB 10.0* 12.5 12.3 12.2 10.8*  HCT 30.1* 36.6 36.5 36.1 31.4*  MCV 90.7 89.9 90.6 90.5 89.7  PLT 253 284 286 354 376   Basic Metabolic  Panel:  Recent Labs Lab 11/27/16 0320 11/28/16 0403 11/29/16 0404 12/01/16 1718 12/03/16 0348  NA 135 137 135 128* 129*  K 3.7 3.4* 3.5 3.5 3.3*  CL 105 106 106 92* 94*  CO2 24 23 20* 24 25  GLUCOSE 195* 91 118* 164* 148*  BUN 20 14 15 10 9   CREATININE 0.88 0.79 0.81 0.76 0.72  CALCIUM 7.9* 8.5* 8.2* 9.2 8.5*   GFR: Estimated Creatinine Clearance: 54.5 mL/min (by C-G formula based on SCr of 0.72 mg/dL). Liver Function Tests:  Recent Labs Lab 11/28/16 0403  AST 19  ALT 16  ALKPHOS 53  BILITOT 0.6  PROT 6.0*  ALBUMIN 3.1*   No results for input(s): LIPASE, AMYLASE in the last 168 hours. No results for input(s): AMMONIA in the last 168 hours. Coagulation Profile: No results for input(s): INR, PROTIME in the last 168 hours. Cardiac Enzymes: No results for input(s): CKTOTAL, CKMB, CKMBINDEX, TROPONINI in the last 168 hours. BNP (last 3 results) No results for input(s): PROBNP in the last 8760 hours. HbA1C: No results for input(s): HGBA1C in the last 72 hours. CBG:  Recent Labs Lab 12/02/16 1957 12/03/16 0024 12/03/16 0413 12/03/16 0738 12/03/16 1155  GLUCAP 159* 134* 140* 128* 116*   Lipid Profile: No results for input(s): CHOL, HDL, LDLCALC, TRIG, CHOLHDL, LDLDIRECT in the last 72 hours. Thyroid Function Tests: No results for input(s): TSH, T4TOTAL, FREET4, T3FREE, THYROIDAB in the last 72 hours. Anemia Panel: No results for input(s): VITAMINB12, FOLATE, FERRITIN, TIBC, IRON, RETICCTPCT in the last 72 hours. Urine analysis:    Component Value Date/Time   COLORURINE RED (A) 11/25/2016 1507   APPEARANCEUR HAZY (A) 11/25/2016 1507   LABSPEC 1.024 11/25/2016 1507   PHURINE 5.0 11/25/2016 1507   GLUCOSEU >=500 (A) 11/25/2016 1507   HGBUR LARGE (A) 11/25/2016 1507   BILIRUBINUR NEGATIVE 11/25/2016 1507   BILIRUBINUR neg 05/06/2015 1421   KETONESUR 20 (A) 11/25/2016 1507   PROTEINUR 100 (A) 11/25/2016 1507   UROBILINOGEN 1.0 05/06/2015 1421   UROBILINOGEN  0.2 12/25/2014 1206   NITRITE NEGATIVE 11/25/2016 1507   LEUKOCYTESUR NEGATIVE 11/25/2016 1507   Sepsis Labs: @LABRCNTIP (procalcitonin:4,lacticidven:4)  ) Recent Results (from the past 240 hour(s))  Urine culture     Status: Abnormal   Collection Time: 11/25/16  1:17 AM  Result Value Ref Range Status   Specimen Description URINE, RANDOM  Final   Special Requests NONE  Final   Culture MULTIPLE SPECIES PRESENT, SUGGEST RECOLLECTION (A)  Final   Report Status 11/26/2016 FINAL  Final  Urine culture     Status: None  Collection Time: 11/25/16  3:07 PM  Result Value Ref Range Status   Specimen Description URINE, CLEAN CATCH  Final   Special Requests NONE  Final   Culture   Final    NO GROWTH Performed at [facility], [place] [address], [place], [place] [address]    Report Status 11/27/2016 FINAL  Final  Blood Culture (routine x 2)     Status: None   Collection Time: 11/25/16  6:49 PM  Result Value Ref Range Status   Specimen Description BLOOD LEFT HAND  Final   Special Requests IN PEDIATRIC BOTTLE Blood Culture adequate volume  Final   Culture   Final    NO GROWTH 5 DAYS Performed at [facility], [place] [address] [place], [place] [address]    Report Status 11/30/2016 FINAL  Final  MRSA PCR Screening     Status: None   Collection Time: 11/26/16 12:23 AM  Result Value Ref Range Status   MRSA by PCR NEGATIVE NEGATIVE Final    Comment:        The GeneXpert MRSA Assay (FDA approved for NASAL specimens only), is one component of a comprehensive MRSA colonization surveillance program. It is not intended to diagnose MRSA infection nor to guide or monitor treatment for MRSA infections.       Radiology Studies: Dg Abd 1 View  Result Date: 12/01/2016 CLINICAL DATA:  83 year old female with nasogastric tube placement. Ascites. Subsequent encounter. EXAM: ABDOMEN - 1 VIEW COMPARISON:  12/01/2016 CT. FINDINGS: Nasogastric tube with tip and side hole gastric fundus  level. Small bowel obstructive pattern with gas distended small bowel loops with thickened folds as noted on recent CT. IMPRESSION: Nasogastric tube with tip and side hole gastric fundus level. Small bowel obstructive pattern. Electronically Signed   By: Genia Del M.D.   On: 12/01/2016 17:15   Ct Abdomen Pelvis W Contrast  Result Date: 12/01/2016 CLINICAL DATA:  confusion, malaise and abdominal pain. Sepsis, abdominal pain. EXAM: CT ABDOMEN AND PELVIS WITH CONTRAST TECHNIQUE: Multidetector CT imaging of the abdomen and pelvis was performed using the standard protocol following bolus administration of intravenous contrast. CONTRAST:  150mL ISOVUE-300 IOPAMIDOL (ISOVUE-300) INJECTION 61%, 54mL ISOVUE-300 IOPAMIDOL (ISOVUE-300) INJECTION 61% COMPARISON:  CT abdomen dated 11/25/2016. FINDINGS: Lower chest: Bilateral pleural effusions, at least moderate in size, incompletely imaged, with adjacent compressive atelectasis. Hepatobiliary: No focal liver abnormality is seen. No gallstones, gallbladder wall thickening, or biliary dilatation. Pancreas: Unremarkable. No pancreatic ductal dilatation or surrounding inflammatory changes. Spleen: Normal in size without focal abnormality. Adrenals/Urinary Tract: Adrenal glands appear normal. Left renal cyst. Kidneys otherwise unremarkable without stone or hydronephrosis. No ureteral or bladder calculi identified. Bladder is decompressed by Foley catheter. Stomach/Bowel: Diffusely dilated fluid-filled small bowel throughout the abdomen and pelvis. Two possible transition zones identified, left and right hemipelvis, with decompression of the distal small bowel (series 2, images 69 and 70 ; series 2, images 63 through 65). Colon is diffusely decompressed indicating complete versus high-grade partial small bowel obstruction. Vascular/Lymphatic: Aortic atherosclerosis. No enlarged abdominal or pelvic lymph nodes. Reproductive: Status post hysterectomy. Other: Moderate amount of  free fluid in the abdomen and pelvis. No circumscribed abscess collection identified. No free intraperitoneal air seen. Musculoskeletal: Degenerative changes throughout the thoracolumbar spine, mild to moderate in degree. No acute or suspicious osseous finding. Diffuse ill-defined fluid/edema within the subcutaneous soft tissues of the abdomen and pelvis, consistent with anasarca. IMPRESSION: 1. Small-bowel obstruction, with diffusely dilated fluid-filled small bowel throughout the  abdomen and pelvis, with decompressed distal small bowel and colon, consistent with complete small bowel obstruction versus high-grade partial obstruction. Two possible sites of obstruction within the pelvis raising the possibility of closed loop obstruction. 2. Moderate amount of ascites within the abdomen and pelvis. No abscess collection seen. 3. Bilateral pleural effusions, at least moderate in size, incompletely imaged. 4. Anasarca. 5. Aortic atherosclerosis. These results were called by telephone at the time of interpretation on 12/01/2016 at 3:32 pm to Dr. Sander Radon , who verbally acknowledged these results. Electronically Signed   By: Franki Cabot M.D.   On: 12/01/2016 15:36   Dg Abd 2 Views  Result Date: 12/03/2016 CLINICAL DATA:  Distended abd EXAM: ABDOMEN - 2 VIEW COMPARISON:  12/02/2016 FINDINGS: Nasogastric tube is in place, tip overlying the level of the stomach. Central dilated small bowel loops are present, unchanged compared prior study. These dilated bowel loops have a central predominance consistent with ascites. Paucity of large bowel gas. No free intraperitoneal air. IMPRESSION: Persistent and unchanged small bowel obstruction. No evidence for perforation. Ascites . Electronically Signed   By: Nolon Nations M.D.   On: 12/03/2016 10:28   Dg Abd 2 Views  Result Date: 12/02/2016 CLINICAL DATA:  Follow-up exam for small bowel obstruction. EXAM: ABDOMEN - 2 VIEW COMPARISON:  Comparison made with CT and  radiograph from 12/01/2016. FINDINGS: NG tube in place with tip in side hole overlying the stomach. Persistent dilated air-filled loops of bowel seen scattered throughout the mid abdomen, measuring up to 4 cm in diameter. Associated air-fluid levels present on lateral decubitus view. Findings consistent with ongoing obstruction. Overall, appearance not significantly changed. Loops of bowel are centralized within the abdomen, like related to previously seen ascites. No other new abnormality.  No free air. Visualized osseous structures unchanged. IMPRESSION: 1. Dilated loops of small bowel clustered within the mid abdomen with associated internal air-fluid levels, consistent with persistent small-bowel obstruction. Overall, appearance not significantly changed relative to most recent radiograph from 12/01/2016. NG tube in place within the stomach. 2. Centralization of loops of bowel within the abdomen, consistent with previously seen ascites. Electronically Signed   By: Jeannine Boga M.D.   On: 12/02/2016 17:07     Scheduled Meds: . insulin aspart  0-9 Units Subcutaneous Q4H  . insulin detemir  4 Units Subcutaneous BID  . levothyroxine  50 mcg Intravenous Daily  . pantoprazole (PROTONIX) IV  40 mg Intravenous Q24H   Continuous Infusions: . cefTRIAXone (ROCEPHIN)  IV Stopped (12/02/16 1316)  . dextrose 5 % and 0.45% NaCl 75 mL/hr at 12/02/16 2153  . potassium chloride 10 mEq (12/03/16 1220)     LOS: 8 days   Time Spent in minutes   30 minutes  Parv Manthey D.O. on 12/03/2016 at 12:44 PM  Between 7am to 7pm - Pager - [phone]  After 7pm go to [email or web address] - password [email or web address]  And look for the night coverage person covering for me after hours  [facility] Office  [phone]

## 2016-12-03 NOTE — NC FL2 (Signed)
[place] LEVEL OF CARE SCREENING TOOL     IDENTIFICATION  Patient Name: [name] [name] Birthdate: 1934/01/07 Sex: female Admission Date (Current Location): 11/25/2016  [facility] and [place] Number:  Herbalist and Address:  [facility],  [place] [address], [place]      Provider Number: [id number]  Attending Physician Name and Address:  Cristal Ford, DO  Relative Name and Phone Number:       Current Level of Care: Hospital Recommended Level of Care: [place] Prior Approval Number:    Date Approved/Denied:   PASRR Number: [id number]  Discharge Plan: SNF    Current Diagnoses: Patient Active Problem List   Diagnosis Date Noted  . Acute urinary retention 11/27/2016  . Ascites 11/27/2016  . Abdominal distension   . Facial droop   . DKA (diabetic ketoacidoses) ([place]) 11/25/2016  . Hyperkalemia 11/25/2016  . Sepsis ([place]) 11/25/2016  . AKI (acute kidney injury) ([place]) 11/25/2016  . Delirium 11/25/2016  . Age-related osteoporosis without current pathological fracture 10/01/2016  . Hyperlipidemia 06/10/2016  . GERD (gastroesophageal reflux disease) 06/10/2016  . Insomnia 06/10/2016  . Viral gastroenteritis   . Right hip pain 05/06/2015  . Vaginal atrophy 11/01/2013  . ECZEMA 07/24/2008  . INTERMITTENT VERTIGO 07/24/2008  . Constipation 11/16/2007  . INSOMNIA 08/12/2007  . Essential hypertension 02/14/2007  . ALLERGIC RHINITIS 02/14/2007  . Hypothyroidism, acquired 01/26/2007  . Type 2 diabetes mellitus without complication, with long-term current use of insulin ([place]) 01/26/2007  . HYPERLIPIDEMIA 01/26/2007  . GERD 01/26/2007  . DEGENERATIVE JOINT DISEASE 01/26/2007    Orientation RESPIRATION BLADDER Height & Weight     Self, Place, Time, Situation  Normal Indwelling catheter Weight: 137 lb (62.1 kg) Height:  5\' 7"  (170.2 cm)  BEHAVIORAL SYMPTOMS/MOOD NEUROLOGICAL BOWEL NUTRITION STATUS   Continent Diet, NG/panda (NPO, has NG tube in place)  AMBULATORY STATUS COMMUNICATION OF NEEDS Skin   Extensive Assist Verbally Normal                       Personal Care Assistance Level of Assistance  Bathing, Feeding, Dressing Bathing Assistance: Limited assistance Feeding assistance: Limited assistance Dressing Assistance: Limited assistance     Functional Limitations Info  Sight, Hearing, Speech Sight Info: Adequate Hearing Info: Adequate Speech Info: Adequate    SPECIAL CARE FACTORS FREQUENCY  PT (By licensed PT), OT (By licensed OT)     PT Frequency: 5x OT Frequency: 5x            Contractures Contractures Info: Not present    Additional Factors Info  Code Status, Allergies Code Status Info: full Allergies Info: Lisinopril, Sulfa Antibiotics, Sulfonamide Derivatives, Bactrim Sulfamethoxazole-trimethoprim           Current Medications (12/03/2016):  This is the current hospital active medication list Current Facility-Administered Medications  Medication Dose Route Frequency Provider Last Rate Last Dose  . acetaminophen (TYLENOL) tablet 650 mg  650 mg Oral Q6H PRN Edwin Dada, MD   650 mg at 11/28/16 2029   Or  . acetaminophen (TYLENOL) suppository 650 mg  650 mg Rectal Q6H PRN Danford, Suann Larry, MD      . cefTRIAXone (ROCEPHIN) 1 g in dextrose 5 % 50 mL IVPB  1 g Intravenous Q24H Tawni Millers, MD   Stopped at 12/02/16 1316  . dextrose 5 %-0.45 % sodium chloride infusion   Intravenous Continuous Tawni Millers, MD 75 mL/hr at 12/02/16 2153    .  hydrALAZINE (APRESOLINE) injection 10 mg  10 mg Intravenous Q4H PRN Arrien, Jimmy Picket, MD   10 mg at 11/28/16 0215  . insulin aspart (novoLOG) injection 0-9 Units  0-9 Units Subcutaneous Q4H Arrien, Jimmy Picket, MD   1 Units at 12/03/16 [id number]  . insulin detemir (LEVEMIR) injection 4 Units  4 Units Subcutaneous BID Tawni Millers, MD   4 Units at 12/03/16 0902  .  iopamidol (ISOVUE-300) 61 % injection 15 mL  15 mL Oral Once PRN Arrien, Jimmy Picket, MD   15 mL at 12/01/16 1504  . levothyroxine (SYNTHROID, LEVOTHROID) injection 50 mcg  50 mcg Intravenous Daily Arrien, Jimmy Picket, MD   50 mcg at 12/03/16 0902  . morphine 4 MG/ML injection 1 mg  1 mg Intravenous Q4H PRN Arrien, Jimmy Picket, MD   1 mg at 12/03/16 0901  . ondansetron (ZOFRAN) tablet 4 mg  4 mg Oral Q6H PRN Edwin Dada, MD   4 mg at 11/30/16 0414   Or  . ondansetron (ZOFRAN) injection 4 mg  4 mg Intravenous Q6H PRN Danford, Suann Larry, MD      . pantoprazole (PROTONIX) injection 40 mg  40 mg Intravenous Q24H Tawni Millers, MD   40 mg at 12/02/16 2153  . phenol (CHLORASEPTIC) mouth spray 1 spray  1 spray Mouth/Throat PRN Cristal Ford, DO   1 spray at 12/02/16 1936  . potassium chloride 10 mEq in 100 mL IVPB  10 mEq Intravenous Q1 Hr x 4 Cristal Ford, DO 100 mL/hr at 12/03/16 1038 10 mEq at 12/03/16 1038     Discharge Medications: Please see discharge summary for a list of discharge medications.  Relevant Imaging Results:  Relevant Lab Results:   Additional Information SS# [id number]  Nila Nephew, LCSW

## 2016-12-04 ENCOUNTER — Encounter (HOSPITAL_COMMUNITY): Payer: Self-pay | Admitting: Surgery

## 2016-12-04 DIAGNOSIS — K56609 Unspecified intestinal obstruction, unspecified as to partial versus complete obstruction: Secondary | ICD-10-CM

## 2016-12-04 DIAGNOSIS — R109 Unspecified abdominal pain: Secondary | ICD-10-CM

## 2016-12-04 DIAGNOSIS — D72829 Elevated white blood cell count, unspecified: Secondary | ICD-10-CM

## 2016-12-04 DIAGNOSIS — E876 Hypokalemia: Secondary | ICD-10-CM

## 2016-12-04 LAB — CBC
HEMATOCRIT: 31.6 % — AB (ref 36.0–46.0)
Hemoglobin: 10.9 g/dL — ABNORMAL LOW (ref 12.0–15.0)
MCH: 30.9 pg (ref 26.0–34.0)
MCHC: 34.5 g/dL (ref 30.0–36.0)
MCV: 89.5 fL (ref 78.0–100.0)
Platelets: 348 10*3/uL (ref 150–400)
RBC: 3.53 MIL/uL — ABNORMAL LOW (ref 3.87–5.11)
RDW: 13.6 % (ref 11.5–15.5)
WBC: 20.8 10*3/uL — ABNORMAL HIGH (ref 4.0–10.5)

## 2016-12-04 LAB — GLUCOSE, CAPILLARY
GLUCOSE-CAPILLARY: 275 mg/dL — AB (ref 65–99)
Glucose-Capillary: 172 mg/dL — ABNORMAL HIGH (ref 65–99)
Glucose-Capillary: 182 mg/dL — ABNORMAL HIGH (ref 65–99)
Glucose-Capillary: 197 mg/dL — ABNORMAL HIGH (ref 65–99)
Glucose-Capillary: 199 mg/dL — ABNORMAL HIGH (ref 65–99)
Glucose-Capillary: 208 mg/dL — ABNORMAL HIGH (ref 65–99)
Glucose-Capillary: 260 mg/dL — ABNORMAL HIGH (ref 65–99)

## 2016-12-04 LAB — BASIC METABOLIC PANEL
Anion gap: 9 (ref 5–15)
BUN: 9 mg/dL (ref 6–20)
CO2: 26 mmol/L (ref 22–32)
Calcium: 8.4 mg/dL — ABNORMAL LOW (ref 8.9–10.3)
Chloride: 97 mmol/L — ABNORMAL LOW (ref 101–111)
Creatinine, Ser: 0.77 mg/dL (ref 0.44–1.00)
GFR calc Af Amer: >60 mL/min (ref >60)
GLUCOSE: 201 mg/dL — AB (ref 65–99)
POTASSIUM: 4.3 mmol/L (ref 3.5–5.1)
Sodium: 132 mmol/L — ABNORMAL LOW (ref 135–145)

## 2016-12-04 NOTE — Progress Notes (Signed)
PROGRESS NOTE    [name] [name]  IRJ:[id number] DOB: 06-Mar-1934 DOA: 11/25/2016 PCP: Aura Dials, MD   Chief Complaint  Patient presents with  . Altered Mental Status  . Hematuria    Brief Narrative:  HPI On 11/25/2016 by Dr. Myrene Buddy [name] [name] is a 83 y.o. female with a past medical history significant for IDDM, hypothyroidism and HTN who presents with 2 days abdominal pain, malaise and now confusion. All history is collected from the patient's daughter, as the patient is obtunded. Per daughter on Monday patient saw her primary doctor who decreased her insulin because of night time lows or highs (she is not sure). Then Tuesday evening on the phone, the patient seemed a little bit "agitated" or "tired" and later that night she called her daughter complaining of some abdominal discomfort, "upset stomach", and asked to be taken to the emergency room.  In the ER, she was quite hyperglycemic but bicarb and gap normal, no leukocytosis, normal renal function.  Lipase elevated but CT unremarkable except moderate ascites.  It seems she felt better and was prepared for discharge, only out of the ordinary finding that she was given a foley bag for retained urine. Per daughter, she slep this morning, and then in the afternoon got up and was altered, totally confused, pulled out her foley and had some gross hematuria (at baseline she lives alone, drives, does her finances online, is totally independent, manages her own meds).  Daughter is aware of no fever, cough, sputum production, dyrsuria, urinary irritation, recent diarrhea (although she has episodes from time to time).  Interim history Patient admitted with sepsis and DKA. Was also found to have acute urinary retention, Foley catheter was placed, failed voiding trial. Patient was also noted to have ileus with bowel traction. General surgery consulted. S/p exploratory laparotomy with lysis of adhesions.  Assessment & Plan    DKA/diabetes mellitus, type II -It was thought that this was a due to patient decreasing her insulin over the past week prior to admission, vs infection -patient was on metformin at home -Resolved, patient did require glucose stabilizer however was transitioned to levemir and ISS  Sepsis secondary to urinary tract infection -Patient didn't have leukocytosis, low-grade temperature, elevated lactic acid, on admission. -Lactic acidosis was thought to be exacerbated by metformin -Patient was started on vancomycin and Zosyn, currently on ceftriaxone- has received 7 days of antibiotic coverage -Will discontinue today -urine culture showed multiple species, subsequent urine culture showed no growth -Blood culture showed no growth to date  Urinary retention -Required Foley catheter placement, patient did fail a voiding trial -May try voiding trial in 24 hours as patient has had resolution of SBO  Acute kidney injury with hyperkalemia -Creatinine was 1.57 on admission, currently 0.77 -Resolved with IV fluids -Hyperkalemia resolved- patient hypokalemic  Hypokalemia -Resolved with supplementation.  -Continue to monitor BMP  Metabolic encephalopathy -Appears to be at baseline per daughter at bedside  Hypothyroidism -Continue Synthroid  Dyslipidemia -Continue statin when tolerating PO   Abdominal pain/Small bowel obstruction -Upon admission, patient did have elevated lipase at 77 which digested pancreatitis however CT scan was normal -Suspected cause was DKA -CT abdomen and pelvis was done on 11/25/2016 showing moderate diffuse abdominal and pelvic ascites, possible gastric wall thickening which could be under distention. No evidence of bowel obstruction -CT abdomen and pelvis on 12/01/2016 showed small bowel obstruction with diffusely dilated fluid-filled small bowel throughout the abdomen and pelvis, 2 possible sites of obstruction and the  pelvis raising possibility of closed loop  obstruction. Moderate amount of ascites in the abdomen and pelvis -Patient does have NG tube in place -Continue IV fluids, antiemetics -Gen. surgery consulted and appreciated, pending further recommendations, may need laparotomy. -Abdominal Xray today shows persistent unchanged SBO. -S/p exploratory laparotomy with lysis of adhesions  Elevated troponin -Possible demand ischemia from DKA, sepsis -Troponin up to 0.44 -Echocardiogram showed EF of 65-70%, no regional wall motion abnormalities, grade 1 diastolic dysfunction   Leukocytosis -Suspect reactive to recent surgery -Currently afebrile wit no complaints of respiratory symptoms -Will continue to monitor CBC -Ordered incentive spirometry   Essential hypertension -continue IV hydralazine as needed  DVT Prophylaxis  lovenox  Code Status: Full  Family Communication: Family at bedside  Disposition Plan: Admitted. Continues to have NG tube, possible discharge within 72 hours to SNF  Consultants General surgery  Procedures  Echocardiogram exploratory laparotomy with lysis of adhesions  Antibiotics   Anti-infectives    Start     Dose/Rate Route Frequency Ordered Stop   12/01/16 1200  cefTRIAXone (ROCEPHIN) 1 g in dextrose 5 % 50 mL IVPB  Status:  Discontinued     1 g 100 mL/hr over 30 Minutes Intravenous Every 24 hours 12/01/16 1021 12/03/16 1732   11/30/16 2200  cephALEXin (KEFLEX) capsule 500 mg  Status:  Discontinued     500 mg Oral Every 12 hours 11/30/16 1141 12/01/16 1021   11/30/16 0000  cephALEXin (KEFLEX) 500 MG capsule     500 mg Oral Every 12 hours 11/30/16 1221 12/03/16 2359   11/26/16 1800  vancomycin (VANCOCIN) IVPB 750 mg/150 ml premix  Status:  Discontinued     750 mg 150 mL/hr over 60 Minutes Intravenous Every 24 hours 11/25/16 1826 11/26/16 0819   11/26/16 1000  cefTRIAXone (ROCEPHIN) 1 g in dextrose 5 % 50 mL IVPB  Status:  Discontinued     1 g 100 mL/hr over 30 Minutes Intravenous Every 24 hours  11/26/16 0819 11/30/16 1141   11/26/16 0000  piperacillin-tazobactam (ZOSYN) IVPB 3.375 g  Status:  Discontinued     3.375 g 12.5 mL/hr over 240 Minutes Intravenous Every 8 hours 11/25/16 1826 11/26/16 0819   11/25/16 1815  vancomycin (VANCOCIN) IVPB 1000 mg/200 mL premix     1,000 mg 200 mL/hr over 60 Minutes Intravenous  Once 11/25/16 1807 11/25/16 1926   11/25/16 1815  piperacillin-tazobactam (ZOSYN) IVPB 3.375 g     3.375 g 100 mL/hr over 30 Minutes Intravenous  Once 11/25/16 1807 11/25/16 1919      Subjective:   [name] [name] seen and examined today. Feels abdominal soreness today. Denies chest pain, shortness of breath, headache, dizziness. Denies flatus.   Objective:   Vitals:   12/03/16 1727 12/03/16 2046 12/04/16 0216 12/04/16 0434  BP: (!) 153/59 (!) 154/61 (!) 147/61 (!) 144/61  Pulse: 83 85 71   Resp: 16 16 14 16   Temp: 98.9 F (37.2 C) 99 F (37.2 C) 98.7 F (37.1 C) 98.5 F (36.9 C)  TempSrc:  Oral Oral Oral  SpO2: 98% 98% 98% 99%  Weight:      Height:        Intake/Output Summary (Last 24 hours) at 12/04/16 1354 Last data filed at 12/04/16 0600  Gross per 24 hour  Intake             2210 ml  Output             2415 ml  Net             -  205 ml   Filed Weights   11/25/16 1757 11/26/16 0017 11/29/16 1107  Weight: 63.5 kg (140 lb) 61.1 kg (134 lb 11.2 oz) 62.1 kg (137 lb)    Exam  General: Well developed, well nourished, No apparent distress  HEENT: NCAT, mucous membranes moist. NG tube in place  Cardiovascular: S1 S2 auscultated, RRR, no murmurs  Respiratory: CTAB  Abdomen: Soft, mild distension, diffusely TTP, + bowel sounds-hypoactive, honeycomb dressing in place  Extremities: warm dry without cyanosis clubbing or edema  Neuro: AAOx3, nonfocal  Psych: Normal affect and demeanor, pleasant  Data Reviewed: I have personally reviewed following labs and imaging studies  CBC:  Recent Labs Lab 11/28/16 0403 11/29/16 0404 12/01/16 1718  12/03/16 0348 12/04/16 0355  WBC 9.1 8.9 9.8 10.6* 20.8*  NEUTROABS 7.4 7.4  --   --   --   HGB 12.5 12.3 12.2 10.8* 10.9*  HCT 36.6 36.5 36.1 31.4* 31.6*  MCV 89.9 90.6 90.5 89.7 89.5  PLT 284 286 354 291 993   Basic Metabolic Panel:  Recent Labs Lab 11/28/16 0403 11/29/16 0404 12/01/16 1718 12/03/16 0348 12/04/16 0355  NA 137 135 128* 129* 132*  K 3.4* 3.5 3.5 3.3* 4.3  CL 106 106 92* 94* 97*  CO2 23 20* 24 25 26   GLUCOSE 91 118* 164* 148* 201*  BUN 14 15 10 9 9   CREATININE 0.79 0.81 0.76 0.72 0.77  CALCIUM 8.5* 8.2* 9.2 8.5* 8.4*   GFR: Estimated Creatinine Clearance: 54.5 mL/min (by C-G formula based on SCr of 0.77 mg/dL). Liver Function Tests:  Recent Labs Lab 11/28/16 0403  AST 19  ALT 16  ALKPHOS 53  BILITOT 0.6  PROT 6.0*  ALBUMIN 3.1*   No results for input(s): LIPASE, AMYLASE in the last 168 hours. No results for input(s): AMMONIA in the last 168 hours. Coagulation Profile: No results for input(s): INR, PROTIME in the last 168 hours. Cardiac Enzymes: No results for input(s): CKTOTAL, CKMB, CKMBINDEX, TROPONINI in the last 168 hours. BNP (last 3 results) No results for input(s): PROBNP in the last 8760 hours. HbA1C: No results for input(s): HGBA1C in the last 72 hours. CBG:  Recent Labs Lab 12/03/16 2035 12/04/16 0009 12/04/16 0418 12/04/16 0723 12/04/16 1250  GLUCAP 198* 208* 182* 197* 275*   Lipid Profile: No results for input(s): CHOL, HDL, LDLCALC, TRIG, CHOLHDL, LDLDIRECT in the last 72 hours. Thyroid Function Tests: No results for input(s): TSH, T4TOTAL, FREET4, T3FREE, THYROIDAB in the last 72 hours. Anemia Panel: No results for input(s): VITAMINB12, FOLATE, FERRITIN, TIBC, IRON, RETICCTPCT in the last 72 hours. Urine analysis:    Component Value Date/Time   COLORURINE RED (A) 11/25/2016 1507   APPEARANCEUR HAZY (A) 11/25/2016 1507   LABSPEC 1.024 11/25/2016 1507   PHURINE 5.0 11/25/2016 1507   GLUCOSEU >=500 (A) 11/25/2016  1507   HGBUR LARGE (A) 11/25/2016 1507   BILIRUBINUR NEGATIVE 11/25/2016 1507   BILIRUBINUR neg 05/06/2015 1421   KETONESUR 20 (A) 11/25/2016 1507   PROTEINUR 100 (A) 11/25/2016 1507   UROBILINOGEN 1.0 05/06/2015 1421   UROBILINOGEN 0.2 12/25/2014 1206   NITRITE NEGATIVE 11/25/2016 1507   LEUKOCYTESUR NEGATIVE 11/25/2016 1507   Sepsis Labs: @LABRCNTIP (procalcitonin:4,lacticidven:4)  ) Recent Results (from the past 240 hour(s))  Urine culture     Status: Abnormal   Collection Time: 11/25/16  1:17 AM  Result Value Ref Range Status   Specimen Description URINE, RANDOM  Final   Special Requests NONE  Final   Culture MULTIPLE  SPECIES PRESENT, SUGGEST RECOLLECTION (A)  Final   Report Status 11/26/2016 FINAL  Final  Urine culture     Status: None   Collection Time: 11/25/16  3:07 PM  Result Value Ref Range Status   Specimen Description URINE, CLEAN CATCH  Final   Special Requests NONE  Final   Culture   Final    NO GROWTH Performed at [facility], [place] [address], [place], [place] [address]    Report Status 11/27/2016 FINAL  Final  Blood Culture (routine x 2)     Status: None   Collection Time: 11/25/16  6:49 PM  Result Value Ref Range Status   Specimen Description BLOOD LEFT HAND  Final   Special Requests IN PEDIATRIC BOTTLE Blood Culture adequate volume  Final   Culture   Final    NO GROWTH 5 DAYS Performed at [facility], [place] [address], [place],  [address]    Report Status 11/30/2016 FINAL  Final  MRSA PCR Screening     Status: None   Collection Time: 11/26/16 12:23 AM  Result Value Ref Range Status   MRSA by PCR NEGATIVE NEGATIVE Final    Comment:        The GeneXpert MRSA Assay (FDA approved for NASAL specimens only), is one component of a comprehensive MRSA colonization surveillance program. It is not intended to diagnose MRSA infection nor to guide or monitor treatment for MRSA infections.   MRSA PCR Screening     Status: None    Collection Time: 12/03/16  1:40 PM  Result Value Ref Range Status   MRSA by PCR NEGATIVE NEGATIVE Final    Comment:        The GeneXpert MRSA Assay (FDA approved for NASAL specimens only), is one component of a comprehensive MRSA colonization surveillance program. It is not intended to diagnose MRSA infection nor to guide or monitor treatment for MRSA infections.       Radiology Studies: Dg Abd 2 Views  Result Date: 12/03/2016 CLINICAL DATA:  Distended abd EXAM: ABDOMEN - 2 VIEW COMPARISON:  12/02/2016 FINDINGS: Nasogastric tube is in place, tip overlying the level of the stomach. Central dilated small bowel loops are present, unchanged compared prior study. These dilated bowel loops have a central predominance consistent with ascites. Paucity of large bowel gas. No free intraperitoneal air. IMPRESSION: Persistent and unchanged small bowel obstruction. No evidence for perforation. Ascites . Electronically Signed   By: Nolon Nations M.D.   On: 12/03/2016 10:28   Dg Abd 2 Views  Result Date: 12/02/2016 CLINICAL DATA:  Follow-up exam for small bowel obstruction. EXAM: ABDOMEN - 2 VIEW COMPARISON:  Comparison made with CT and radiograph from 12/01/2016. FINDINGS: NG tube in place with tip in side hole overlying the stomach. Persistent dilated air-filled loops of bowel seen scattered throughout the mid abdomen, measuring up to 4 cm in diameter. Associated air-fluid levels present on lateral decubitus view. Findings consistent with ongoing obstruction. Overall, appearance not significantly changed. Loops of bowel are centralized within the abdomen, like related to previously seen ascites. No other new abnormality.  No free air. Visualized osseous structures unchanged. IMPRESSION: 1. Dilated loops of small bowel clustered within the mid abdomen with associated internal air-fluid levels, consistent with persistent small-bowel obstruction. Overall, appearance not significantly changed relative to  most recent radiograph from 12/01/2016. NG tube in place within the stomach. 2. Centralization of loops of bowel within the abdomen, consistent with previously seen ascites. Electronically Signed  By: Jeannine Boga M.D.   On: 12/02/2016 17:07     Scheduled Meds: . enoxaparin (LOVENOX) injection  40 mg Subcutaneous Q24H  . insulin aspart  0-15 Units Subcutaneous Q4H  . pantoprazole (PROTONIX) IV  40 mg Intravenous QHS   Continuous Infusions: . dextrose 5 % and 0.45 % NaCl with KCl 20 mEq/L 100 mL/hr at 12/04/16 0438     LOS: 9 days   Time Spent in minutes   30 minutes  [name] [name] D.O. on 12/04/2016 at 1:54 PM  Between 7am to 7pm - Pager - [phone]  After 7pm go to [email or web address] - password [email or web address]  And look for the night coverage person covering for me after hours  [facility] Office  [phone]

## 2016-12-04 NOTE — Progress Notes (Addendum)
CSW following for disposition/ DC planning.  Called pt's daughters to follow up on  bed offers which they received from [facility] yesterday. Left voicemail -[name] [phone]. Voicemail box fullBeryle Beams [phone].   Will need [employer] auth for SNF once bed choice made.   Sharren Bridge, MSW, LCSW Clinical Social Work 12/04/2016 [phone]  15:30- Received call back from daughters. Daughter visited [place] and prefer this facility. Daughters and CSW were informed by admissions there are no beds at [facility] but that there are beds at sister facility [facility] in upcoming days. Daughters initially resistant to [facility] due to further driving distance, however after discussing agreed upon [facility] due to [facility] contract with them.  [facility] states if pt admitted there, pt could be transferred to [facility] when bed opens. Daughters agreeable.  CSW informed [facility]. Per regina in admissions, began [facility] authorization.   Will continue following to assist with DC plan.   Marland Kitchen

## 2016-12-04 NOTE — Progress Notes (Signed)
[facility] Progress Note  1 Day Post-Op  Subjective: CC: SBO s/p ex lap LOA Abdominal pain controlled and described as soreness. Denise flatus or BM. Belching. Foley in place. No IS at bedside. Not mobilizing.  Objective: Vital signs in last 24 hours: Temp:  [98.5 F (36.9 C)-99 F (37.2 C)] 98.5 F (36.9 C) (05/18 0434) Pulse Rate:  [71-87] 71 (05/18 0216) Resp:  [11-16] 16 (05/18 0434) BP: (144-181)/(59-76) 144/61 (05/18 0434) SpO2:  [95 %-100 %] 99 % (05/18 0434) Last BM Date: 11/26/16  Intake/Output from previous day: 05/17 0701 - 05/18 0700 In: 2270 [P.O.:60; I.V.:2210] Out: 2965 [Urine:1415; Emesis/NG output:500; Blood:50] Intake/Output this shift: No intake/output data recorded.  PE: Gen:  Alert, NAD, pleasant HEENT: NGT in place, 500 cc/24h bilious  Card:  Regular rate and rhythm, pedal pulses 2+ BL Pulm:  Normal effort, clear to auscultation bilaterally Abd: Soft, appropriately tender, mild distention, bowel sounds present but hypoactive, Honeycomb dressing C/D/I GU: foley in place, 100 cc yellow urine in foley bag; 1,415 cc urine/24h Skin: warm and dry, no rashes  Psych: A&Ox3   Lab Results:   Recent Labs  12/03/16 0348 12/04/16 0355  WBC 10.6* 20.8*  HGB 10.8* 10.9*  HCT 31.4* 31.6*  PLT 291 348   BMET  Recent Labs  12/03/16 0348 12/04/16 0355  NA 129* 132*  K 3.3* 4.3  CL 94* 97*  CO2 25 26  GLUCOSE 148* 201*  BUN 9 9  CREATININE 0.72 0.77  CALCIUM 8.5* 8.4*   PT/INR No results for input(s): LABPROT, INR in the last 72 hours. CMP     Component Value Date/Time   NA 132 (L) 12/04/2016 0355   K 4.3 12/04/2016 0355   CL 97 (L) 12/04/2016 0355   CO2 26 12/04/2016 0355   GLUCOSE 201 (H) 12/04/2016 0355   BUN 9 12/04/2016 0355   CREATININE 0.77 12/04/2016 0355   CREATININE 0.98 09/25/2014 1654   CALCIUM 8.4 (L) 12/04/2016 0355   PROT 6.0 (L) 11/28/2016 0403   ALBUMIN 3.1 (L) 11/28/2016 0403   AST 19 11/28/2016 0403   ALT 16 11/28/2016 0403   ALKPHOS 53 11/28/2016 0403   BILITOT 0.6 11/28/2016 0403   GFRNONAA >60 12/04/2016 0355   GFRNONAA 55 (L) 09/25/2014 1654   GFRAA >60 12/04/2016 0355   GFRAA 64 09/25/2014 1654   Lipase     Component Value Date/Time   LIPASE 22 11/26/2016 0511       Studies/Results: Dg Abd 2 Views  Result Date: 12/03/2016 CLINICAL DATA:  Distended abd EXAM: ABDOMEN - 2 VIEW COMPARISON:  12/02/2016 FINDINGS: Nasogastric tube is in place, tip overlying the level of the stomach. Central dilated small bowel loops are present, unchanged compared prior study. These dilated bowel loops have a central predominance consistent with ascites. Paucity of large bowel gas. No free intraperitoneal air. IMPRESSION: Persistent and unchanged small bowel obstruction. No evidence for perforation. Ascites . Electronically Signed   By: Nolon Nations M.D.   On: 12/03/2016 10:28   Dg Abd 2 Views  Result Date: 12/02/2016 CLINICAL DATA:  Follow-up exam for small bowel obstruction. EXAM: ABDOMEN - 2 VIEW COMPARISON:  Comparison made with CT and radiograph from 12/01/2016. FINDINGS: NG tube in place with tip in side hole overlying the stomach. Persistent dilated air-filled loops of bowel seen scattered throughout the mid abdomen, measuring up to 4 cm in diameter. Associated air-fluid levels present on lateral decubitus view. Findings consistent with ongoing obstruction. Overall, appearance  not significantly changed. Loops of bowel are centralized within the abdomen, like related to previously seen ascites. No other new abnormality.  No free air. Visualized osseous structures unchanged. IMPRESSION: 1. Dilated loops of small bowel clustered within the mid abdomen with associated internal air-fluid levels, consistent with persistent small-bowel obstruction. Overall, appearance not significantly changed relative to most recent radiograph from 12/01/2016. NG tube in place within the stomach. 2. Centralization of  loops of bowel within the abdomen, consistent with previously seen ascites. Electronically Signed   By: Jeannine Boga M.D.   On: 12/02/2016 17:07    Anti-infectives: Anti-infectives    Start     Dose/Rate Route Frequency Ordered Stop   12/01/16 1200  cefTRIAXone (ROCEPHIN) 1 g in dextrose 5 % 50 mL IVPB  Status:  Discontinued     1 g 100 mL/hr over 30 Minutes Intravenous Every 24 hours 12/01/16 1021 12/03/16 1732   11/30/16 2200  cephALEXin (KEFLEX) capsule 500 mg  Status:  Discontinued     500 mg Oral Every 12 hours 11/30/16 1141 12/01/16 1021   11/30/16 0000  cephALEXin (KEFLEX) 500 MG capsule     500 mg Oral Every 12 hours 11/30/16 1221 12/03/16 2359   11/26/16 1800  vancomycin (VANCOCIN) IVPB 750 mg/150 ml premix  Status:  Discontinued     750 mg 150 mL/hr over 60 Minutes Intravenous Every 24 hours 11/25/16 1826 11/26/16 0819   11/26/16 1000  cefTRIAXone (ROCEPHIN) 1 g in dextrose 5 % 50 mL IVPB  Status:  Discontinued     1 g 100 mL/hr over 30 Minutes Intravenous Every 24 hours 11/26/16 0819 11/30/16 1141   11/26/16 0000  piperacillin-tazobactam (ZOSYN) IVPB 3.375 g  Status:  Discontinued     3.375 g 12.5 mL/hr over 240 Minutes Intravenous Every 8 hours 11/25/16 1826 11/26/16 0819   11/25/16 1815  vancomycin (VANCOCIN) IVPB 1000 mg/200 mL premix     1,000 mg 200 mL/hr over 60 Minutes Intravenous  Once 11/25/16 1807 11/25/16 1926   11/25/16 1815  piperacillin-tazobactam (ZOSYN) IVPB 3.375 g     3.375 g 100 mL/hr over 30 Minutes Intravenous  Once 11/25/16 1807 11/25/16 1919      Assessment/Plan SBO 2/2 intra-abdominal adhestions S/P EXPLORATORY LAPAROTOMY, LYSIS OF ADHESIONS 12/03/16 Dr. Armandina Gemma  Afebrile, VSS  Leukocytosis (20.8) - repeat CBC in AM  Continue NGT and Await bowel function  Mobilize/IS  Staple removal in 10-14 days   Urinary retention - will continue foley for this reason, otherwise from a surgical standpoint it is ok to D/C.    FEN - NPO, IVF,  hyponatremia (132), hyperglycemia (201)  ID - Vanc 5/9, Zosyn 5/9-5/10, Rocephin 5/10-5/17, Keflex 5/14-5/15  VTE - SCD's, Lovenox   LOS: 9 days    Jill Alexanders , [facility] 12/04/2016, 11:40 AM Pager: [phone] Consults: [phone] Mon-Fri 7:00 am-4:30 pm Sat-Sun 7:00 am-11:30 am

## 2016-12-05 LAB — BASIC METABOLIC PANEL
Anion gap: 7 (ref 5–15)
BUN: 9 mg/dL (ref 6–20)
CALCIUM: 7.9 mg/dL — AB (ref 8.9–10.3)
CO2: 25 mmol/L (ref 22–32)
Chloride: 99 mmol/L — ABNORMAL LOW (ref 101–111)
Creatinine, Ser: 0.82 mg/dL (ref 0.44–1.00)
GFR calc Af Amer: >60 mL/min (ref >60)
GLUCOSE: 269 mg/dL — AB (ref 65–99)
Potassium: 4.3 mmol/L (ref 3.5–5.1)
Sodium: 131 mmol/L — ABNORMAL LOW (ref 135–145)

## 2016-12-05 LAB — CBC
HCT: 28.6 % — ABNORMAL LOW (ref 36.0–46.0)
Hemoglobin: 9.5 g/dL — ABNORMAL LOW (ref 12.0–15.0)
MCH: 30.4 pg (ref 26.0–34.0)
MCHC: 33.2 g/dL (ref 30.0–36.0)
MCV: 91.7 fL (ref 78.0–100.0)
PLATELETS: 344 10*3/uL (ref 150–400)
RBC: 3.12 MIL/uL — ABNORMAL LOW (ref 3.87–5.11)
RDW: 13.9 % (ref 11.5–15.5)
WBC: 12.1 10*3/uL — ABNORMAL HIGH (ref 4.0–10.5)

## 2016-12-05 LAB — GLUCOSE, CAPILLARY
GLUCOSE-CAPILLARY: 254 mg/dL — AB (ref 65–99)
GLUCOSE-CAPILLARY: 211 mg/dL — AB (ref 65–99)
Glucose-Capillary: 265 mg/dL — ABNORMAL HIGH (ref 65–99)
Glucose-Capillary: 169 mg/dL — ABNORMAL HIGH (ref 65–99)
Glucose-Capillary: 183 mg/dL — ABNORMAL HIGH (ref 65–99)
Glucose-Capillary: 209 mg/dL — ABNORMAL HIGH (ref 65–99)

## 2016-12-05 NOTE — Progress Notes (Addendum)
PROGRESS NOTE    [name] [name]  EUM:[id number] DOB: June 04, 1934 DOA: 11/25/2016 PCP: Aura Dials, MD   Chief Complaint  Patient presents with  . Altered Mental Status  . Hematuria    Brief Narrative:  HPI On 11/25/2016 by Dr. Myrene Buddy [name] [name] is a 83 y.o. female with a past medical history significant for IDDM, hypothyroidism and HTN who presents with 2 days abdominal pain, malaise and now confusion. All history is collected from the patient's daughter, as the patient is obtunded. Per daughter on Monday patient saw her primary doctor who decreased her insulin because of night time lows or highs (she is not sure). Then Tuesday evening on the phone, the patient seemed a little bit "agitated" or "tired" and later that night she called her daughter complaining of some abdominal discomfort, "upset stomach", and asked to be taken to the emergency room.  In the ER, she was quite hyperglycemic but bicarb and gap normal, no leukocytosis, normal renal function.  Lipase elevated but CT unremarkable except moderate ascites.  It seems she felt better and was prepared for discharge, only out of the ordinary finding that she was given a foley bag for retained urine. Per daughter, she slep this morning, and then in the afternoon got up and was altered, totally confused, pulled out her foley and had some gross hematuria (at baseline she lives alone, drives, does her finances online, is totally independent, manages her own meds).  Daughter is aware of no fever, cough, sputum production, dyrsuria, urinary irritation, recent diarrhea (although she has episodes from time to time).  Interim history Patient admitted with sepsis and DKA. Was also found to have acute urinary retention, Foley catheter was placed, failed voiding trial. Patient was also noted to have ileus with bowel traction. General surgery consulted. S/p exploratory laparotomy with lysis of adhesions.  Assessment & Plan    DKA/diabetes mellitus, type II -It was thought that this was a due to patient decreasing her insulin over the past week prior to admission, vs infection -patient was on metformin at home -Resolved, patient did require glucose stabilizer however was transitioned to levemir and ISS  Sepsis secondary to urinary tract infection -Patient didn't have leukocytosis, low-grade temperature, elevated lactic acid, on admission. -Lactic acidosis was thought to be exacerbated by metformin -Patient was started on vancomycin and Zosyn, currently on ceftriaxone- has received 7 days of antibiotic coverage -Will discontinue today -urine culture showed multiple species, subsequent urine culture showed no growth -Blood culture showed no growth to date  Urinary retention -Required Foley catheter placement, patient did fail a voiding trial -May try voiding trial in 1-2 days as patient's SBO continues to improve  Acute kidney injury with hyperkalemia -Creatinine was 1.57 on admission, currently 0.82 -Resolved with IV fluids -Hyperkalemia resolved  Hypokalemia -Resolved with supplementation.  -Continue to monitor BMP  Metabolic encephalopathy -Appears to be at baseline per daughter at bedside  Hypothyroidism -Continue Synthroid  Dyslipidemia -Continue statin when tolerating PO   Abdominal pain/Small bowel obstruction -Upon admission, patient did have elevated lipase at 77 which digested pancreatitis however CT scan was normal -Suspected cause was DKA -CT abdomen and pelvis was done on 11/25/2016 showing moderate diffuse abdominal and pelvic ascites, possible gastric wall thickening which could be under distention. No evidence of bowel obstruction -CT abdomen and pelvis on 12/01/2016 showed small bowel obstruction with diffusely dilated fluid-filled small bowel throughout the abdomen and pelvis, 2 possible sites of obstruction and the pelvis raising possibility  of closed loop obstruction. Moderate  amount of ascites in the abdomen and pelvis -Patient does have NG tube in place -Continue IV fluids, antiemetics -Gen. surgery consulted and appreciated, pending further recommendations, may need laparotomy. -Abdominal Xray 5/17 showed persistent unchanged SBO. -S/p exploratory laparotomy with lysis of adhesions on 12/03/16  Elevated troponin -Possible demand ischemia from DKA, sepsis -Troponin up to 0.44 -Echocardiogram showed EF of 65-70%, no regional wall motion abnormalities, grade 1 diastolic dysfunction   Leukocytosis -Suspect reactive to recent surgery -Currently afebrile wit no complaints of respiratory symptoms -WBC trending donward -Will continue to monitor CBC  Essential hypertension -continue IV hydralazine as needed  Severe malnutrition -in the context of chronic illness -nutrition consulted -currently NPO given recent SBO  DVT Prophylaxis  lovenox  Code Status: Full  Family Communication: None at bedside  Disposition Plan: Admitted. Continues to have NG tube, possible discharge within 72 hours to SNF  Consultants General surgery  Procedures  Echocardiogram exploratory laparotomy with lysis of adhesions  Antibiotics   Anti-infectives    Start     Dose/Rate Route Frequency Ordered Stop   12/01/16 1200  cefTRIAXone (ROCEPHIN) 1 g in dextrose 5 % 50 mL IVPB  Status:  Discontinued     1 g 100 mL/hr over 30 Minutes Intravenous Every 24 hours 12/01/16 1021 12/03/16 1732   11/30/16 2200  cephALEXin (KEFLEX) capsule 500 mg  Status:  Discontinued     500 mg Oral Every 12 hours 11/30/16 1141 12/01/16 1021   11/30/16 0000  cephALEXin (KEFLEX) 500 MG capsule     500 mg Oral Every 12 hours 11/30/16 1221 12/03/16 2359   11/26/16 1800  vancomycin (VANCOCIN) IVPB 750 mg/150 ml premix  Status:  Discontinued     750 mg 150 mL/hr over 60 Minutes Intravenous Every 24 hours 11/25/16 1826 11/26/16 0819   11/26/16 1000  cefTRIAXone (ROCEPHIN) 1 g in dextrose 5 % 50 mL IVPB   Status:  Discontinued     1 g 100 mL/hr over 30 Minutes Intravenous Every 24 hours 11/26/16 0819 11/30/16 1141   11/26/16 0000  piperacillin-tazobactam (ZOSYN) IVPB 3.375 g  Status:  Discontinued     3.375 g 12.5 mL/hr over 240 Minutes Intravenous Every 8 hours 11/25/16 1826 11/26/16 0819   11/25/16 1815  vancomycin (VANCOCIN) IVPB 1000 mg/200 mL premix     1,000 mg 200 mL/hr over 60 Minutes Intravenous  Once 11/25/16 1807 11/25/16 1926   11/25/16 1815  piperacillin-tazobactam (ZOSYN) IVPB 3.375 g     3.375 g 100 mL/hr over 30 Minutes Intravenous  Once 11/25/16 1807 11/25/16 1919      Subjective:   Riesa Pope seen and examined today. Feels abdominal soreness today, but feels pain has improved. Denies currently chest pain, shortness of breath, headache, dizziness. Has not had any gas passage.    Objective:   Vitals:   12/04/16 0434 12/04/16 1345 12/04/16 2013 12/05/16 0422  BP: (!) 144/61 (!) 142/53 (!) 137/52 (!) 143/58  Pulse:  84 77 79  Resp: 16 16 16 16   Temp: 98.5 F (36.9 C) 98 F (36.7 C) 98 F (36.7 C) 98.6 F (37 C)  TempSrc: Oral Oral Oral Oral  SpO2: 99% 94% 100% 94%  Weight:      Height:        Intake/Output Summary (Last 24 hours) at 12/05/16 1214 Last data filed at 12/05/16 0600  Gross per 24 hour  Intake  2100 ml  Output             1350 ml  Net              750 ml   Filed Weights   11/25/16 1757 11/26/16 0017 11/29/16 1107  Weight: 63.5 kg (140 lb) 61.1 kg (134 lb 11.2 oz) 62.1 kg (137 lb)   Exam  General: Well developed, well nourished, NAD, appears stated age  23: NCAT,  mucous membranes moist. NG tube in place  Cardiovascular: S1 S2 auscultated, no rubs, murmurs or gallops. Regular rate and rhythm.  Respiratory: Clear to auscultation bilaterally with equal chest rise  Abdomen: Soft, mild TTP, mildly distended, few bowel sounds  Extremities: warm dry without cyanosis clubbing or edema  Neuro: AAOx3, nonfocal  Psych:  Normal affect and demeanor with intact judgement and insight  Data Reviewed: I have personally reviewed following labs and imaging studies  CBC:  Recent Labs Lab 11/29/16 0404 12/01/16 1718 12/03/16 0348 12/04/16 0355 12/05/16 0424  WBC 8.9 9.8 10.6* 20.8* 12.1*  NEUTROABS 7.4  --   --   --   --   HGB 12.3 12.2 10.8* 10.9* 9.5*  HCT 36.5 36.1 31.4* 31.6* 28.6*  MCV 90.6 90.5 89.7 89.5 91.7  PLT 286 354 291 348 454   Basic Metabolic Panel:  Recent Labs Lab 11/29/16 0404 12/01/16 1718 12/03/16 0348 12/04/16 0355 12/05/16 0424  NA 135 128* 129* 132* 131*  K 3.5 3.5 3.3* 4.3 4.3  CL 106 92* 94* 97* 99*  CO2 20* 24 25 26 25   GLUCOSE 118* 164* 148* 201* 269*  BUN 15 10 9 9 9   CREATININE 0.81 0.76 0.72 0.77 0.82  CALCIUM 8.2* 9.2 8.5* 8.4* 7.9*   GFR: Estimated Creatinine Clearance: 53.2 mL/min (by C-G formula based on SCr of 0.82 mg/dL). Liver Function Tests: No results for input(s): AST, ALT, ALKPHOS, BILITOT, PROT, ALBUMIN in the last 168 hours. No results for input(s): LIPASE, AMYLASE in the last 168 hours. No results for input(s): AMMONIA in the last 168 hours. Coagulation Profile: No results for input(s): INR, PROTIME in the last 168 hours. Cardiac Enzymes: No results for input(s): CKTOTAL, CKMB, CKMBINDEX, TROPONINI in the last 168 hours. BNP (last 3 results) No results for input(s): PROBNP in the last 8760 hours. HbA1C: No results for input(s): HGBA1C in the last 72 hours. CBG:  Recent Labs Lab 12/04/16 2002 12/04/16 2352 12/05/16 0412 12/05/16 0758 12/05/16 1154  GLUCAP 199* 172* 265* 254* 169*   Lipid Profile: No results for input(s): CHOL, HDL, LDLCALC, TRIG, CHOLHDL, LDLDIRECT in the last 72 hours. Thyroid Function Tests: No results for input(s): TSH, T4TOTAL, FREET4, T3FREE, THYROIDAB in the last 72 hours. Anemia Panel: No results for input(s): VITAMINB12, FOLATE, FERRITIN, TIBC, IRON, RETICCTPCT in the last 72 hours. Urine analysis:      Component Value Date/Time   COLORURINE RED (A) 11/25/2016 1507   APPEARANCEUR HAZY (A) 11/25/2016 1507   LABSPEC 1.024 11/25/2016 1507   PHURINE 5.0 11/25/2016 1507   GLUCOSEU >=500 (A) 11/25/2016 1507   HGBUR LARGE (A) 11/25/2016 1507   BILIRUBINUR NEGATIVE 11/25/2016 1507   BILIRUBINUR neg 05/06/2015 1421   KETONESUR 20 (A) 11/25/2016 1507   PROTEINUR 100 (A) 11/25/2016 1507   UROBILINOGEN 1.0 05/06/2015 1421   UROBILINOGEN 0.2 12/25/2014 1206   NITRITE NEGATIVE 11/25/2016 1507   LEUKOCYTESUR NEGATIVE 11/25/2016 1507   Sepsis Labs: @LABRCNTIP (procalcitonin:4,lacticidven:4)  ) Recent Results (from the past 240 hour(s))  Urine culture  Status: None   Collection Time: 11/25/16  3:07 PM  Result Value Ref Range Status   Specimen Description URINE, CLEAN CATCH  Final   Special Requests NONE  Final   Culture   Final    NO GROWTH Performed at [facility], [address] [place], [place] [address]    Report Status 11/27/2016 FINAL  Final  Blood Culture (routine x 2)     Status: None   Collection Time: 11/25/16  6:49 PM  Result Value Ref Range Status   Specimen Description BLOOD LEFT HAND  Final   Special Requests IN PEDIATRIC BOTTLE Blood Culture adequate volume  Final   Culture   Final    NO GROWTH 5 DAYS Performed at [facility], [place] [address] [place], [place] [address]    Report Status 11/30/2016 FINAL  Final  MRSA PCR Screening     Status: None   Collection Time: 11/26/16 12:23 AM  Result Value Ref Range Status   MRSA by PCR NEGATIVE NEGATIVE Final    Comment:        The GeneXpert MRSA Assay (FDA approved for NASAL specimens only), is one component of a comprehensive MRSA colonization surveillance program. It is not intended to diagnose MRSA infection nor to guide or monitor treatment for MRSA infections.   MRSA PCR Screening     Status: None   Collection Time: 12/03/16  1:40 PM  Result Value Ref Range Status   MRSA by PCR NEGATIVE  NEGATIVE Final    Comment:        The GeneXpert MRSA Assay (FDA approved for NASAL specimens only), is one component of a comprehensive MRSA colonization surveillance program. It is not intended to diagnose MRSA infection nor to guide or monitor treatment for MRSA infections.       Radiology Studies: No results found.   Scheduled Meds: . enoxaparin (LOVENOX) injection  40 mg Subcutaneous Q24H  . insulin aspart  0-15 Units Subcutaneous Q4H  . pantoprazole (PROTONIX) IV  40 mg Intravenous QHS   Continuous Infusions: . dextrose 5 % and 0.45 % NaCl with KCl 20 mEq/L 100 mL/hr at 12/05/16 1200     LOS: 10 days   Time Spent in minutes   30 minutes  Sabriah Hobbins D.O. on 12/05/2016 at 12:14 PM  Between 7am to 7pm - Pager - [phone]  After 7pm go to [email or web address] - password [email or web address]  And look for the night coverage person covering for me after hours  [facility] Office  [phone]

## 2016-12-05 NOTE — Progress Notes (Signed)
CSW will continue to follow and assist with discharge needs to SNF, when medically stable. [employer] authorization was started on 5/18. Per CSW note at [facility].   Kathrin Greathouse, Latanya Presser, MSW Clinical Social Worker 5E and Psychiatric Service Line [phone] Coverage.  12/05/2016  11:07 AM

## 2016-12-05 NOTE — Progress Notes (Signed)
2 Days Post-Op   Subjective/Chief Complaint: No complaints except for a sore throat from ng   Objective: Vital signs in last 24 hours: Temp:  [98 F (36.7 C)-98.6 F (37 C)] 98.6 F (37 C) (05/19 0422) Pulse Rate:  [77-84] 79 (05/19 0422) Resp:  [16] 16 (05/19 0422) BP: (137-143)/(52-58) 143/58 (05/19 0422) SpO2:  [94 %-100 %] 94 % (05/19 0422) Last BM Date: 11/26/16  Intake/Output from previous day: 05/18 0701 - 05/19 0700 In: 2100 [I.V.:2100] Out: 1500 [Urine:1300; Emesis/NG output:200] Intake/Output this shift: Total I/O In: 1200 [I.V.:1200] Out: 1300 [Urine:1150; Emesis/NG output:150]  General appearance: alert and cooperative Resp: clear to auscultation bilaterally Cardio: regular rate and rhythm GI: soft, mild tenderness. slightly distended. few bs  Lab Results:   Recent Labs  12/04/16 0355 12/05/16 0424  WBC 20.8* 12.1*  HGB 10.9* 9.5*  HCT 31.6* 28.6*  PLT 348 344   BMET  Recent Labs  12/04/16 0355 12/05/16 0424  NA 132* 131*  K 4.3 4.3  CL 97* 99*  CO2 26 25  GLUCOSE 201* 269*  BUN 9 9  CREATININE 0.77 0.82  CALCIUM 8.4* 7.9*   PT/INR No results for input(s): LABPROT, INR in the last 72 hours. ABG No results for input(s): PHART, HCO3 in the last 72 hours.  Invalid input(s): PCO2, PO2  Studies/Results: Dg Abd 2 Views  Result Date: 12/03/2016 CLINICAL DATA:  Distended abd EXAM: ABDOMEN - 2 VIEW COMPARISON:  12/02/2016 FINDINGS: Nasogastric tube is in place, tip overlying the level of the stomach. Central dilated small bowel loops are present, unchanged compared prior study. These dilated bowel loops have a central predominance consistent with ascites. Paucity of large bowel gas. No free intraperitoneal air. IMPRESSION: Persistent and unchanged small bowel obstruction. No evidence for perforation. Ascites . Electronically Signed   By: Nolon Nations M.D.   On: 12/03/2016 10:28    Anti-infectives: Anti-infectives    Start     Dose/Rate  Route Frequency Ordered Stop   12/01/16 1200  cefTRIAXone (ROCEPHIN) 1 g in dextrose 5 % 50 mL IVPB  Status:  Discontinued     1 g 100 mL/hr over 30 Minutes Intravenous Every 24 hours 12/01/16 1021 12/03/16 1732   11/30/16 2200  cephALEXin (KEFLEX) capsule 500 mg  Status:  Discontinued     500 mg Oral Every 12 hours 11/30/16 1141 12/01/16 1021   11/30/16 0000  cephALEXin (KEFLEX) 500 MG capsule     500 mg Oral Every 12 hours 11/30/16 1221 12/03/16 2359   11/26/16 1800  vancomycin (VANCOCIN) IVPB 750 mg/150 ml premix  Status:  Discontinued     750 mg 150 mL/hr over 60 Minutes Intravenous Every 24 hours 11/25/16 1826 11/26/16 0819   11/26/16 1000  cefTRIAXone (ROCEPHIN) 1 g in dextrose 5 % 50 mL IVPB  Status:  Discontinued     1 g 100 mL/hr over 30 Minutes Intravenous Every 24 hours 11/26/16 0819 11/30/16 1141   11/26/16 0000  piperacillin-tazobactam (ZOSYN) IVPB 3.375 g  Status:  Discontinued     3.375 g 12.5 mL/hr over 240 Minutes Intravenous Every 8 hours 11/25/16 1826 11/26/16 0819   11/25/16 1815  vancomycin (VANCOCIN) IVPB 1000 mg/200 mL premix     1,000 mg 200 mL/hr over 60 Minutes Intravenous  Once 11/25/16 1807 11/25/16 1926   11/25/16 1815  piperacillin-tazobactam (ZOSYN) IVPB 3.375 g     3.375 g 100 mL/hr over 30 Minutes Intravenous  Once 11/25/16 1807 11/25/16 1919  Assessment/Plan: s/p Procedure(s): EXPLORATORY LAPAROTOMY, LYSIS OF ADHESIONS (N/A) Continue ng and bowel rest for postop ileus  OOB  LOS: 10 days    TOTH III,Clarissa Laird S 12/05/2016

## 2016-12-06 LAB — BASIC METABOLIC PANEL
Anion gap: 8 (ref 5–15)
BUN: 6 mg/dL (ref 6–20)
CHLORIDE: 96 mmol/L — AB (ref 101–111)
CO2: 26 mmol/L (ref 22–32)
CREATININE: 0.72 mg/dL (ref 0.44–1.00)
Calcium: 8.4 mg/dL — ABNORMAL LOW (ref 8.9–10.3)
GFR calc Af Amer: >60 mL/min (ref >60)
GFR calc non Af Amer: >60 mL/min (ref >60)
GLUCOSE: 217 mg/dL — AB (ref 65–99)
Potassium: 4.1 mmol/L (ref 3.5–5.1)
Sodium: 130 mmol/L — ABNORMAL LOW (ref 135–145)

## 2016-12-06 LAB — CBC
HEMATOCRIT: 31.5 % — AB (ref 36.0–46.0)
HEMOGLOBIN: 10.8 g/dL — AB (ref 12.0–15.0)
MCH: 30.9 pg (ref 26.0–34.0)
MCHC: 34.3 g/dL (ref 30.0–36.0)
MCV: 90.3 fL (ref 78.0–100.0)
Platelets: 352 10*3/uL (ref 150–400)
RBC: 3.49 MIL/uL — ABNORMAL LOW (ref 3.87–5.11)
RDW: 13.6 % (ref 11.5–15.5)
WBC: 11.1 10*3/uL — ABNORMAL HIGH (ref 4.0–10.5)

## 2016-12-06 LAB — GLUCOSE, CAPILLARY
GLUCOSE-CAPILLARY: 193 mg/dL — AB (ref 65–99)
GLUCOSE-CAPILLARY: 225 mg/dL — AB (ref 65–99)
Glucose-Capillary: 199 mg/dL — ABNORMAL HIGH (ref 65–99)
Glucose-Capillary: 229 mg/dL — ABNORMAL HIGH (ref 65–99)
Glucose-Capillary: 232 mg/dL — ABNORMAL HIGH (ref 65–99)

## 2016-12-06 MED ORDER — ORAL CARE MOUTH RINSE
15.0000 mL | Freq: Two times a day (BID) | OROMUCOSAL | Status: DC
  Administered 2016-12-06 – 2016-12-08 (×6): 15 mL via OROMUCOSAL

## 2016-12-06 NOTE — Progress Notes (Signed)
3 Days Post-Op   Subjective/Chief Complaint: No complaints. Still no flatus   Objective: Vital signs in last 24 hours: Temp:  [98.2 F (36.8 C)-98.8 F (37.1 C)] 98.6 F (37 C) (05/20 0457) Pulse Rate:  [82-86] 86 (05/20 0457) Resp:  [16-18] 18 (05/20 0457) BP: (148-165)/(58-75) 148/58 (05/20 0457) SpO2:  [94 %-96 %] 96 % (05/20 0457) Last BM Date: 11/26/16  Intake/Output from previous day: 05/19 0701 - 05/20 0700 In: -  Out: 350 [Urine:350] Intake/Output this shift: Total I/O In: -  Out: 75 [Emesis/NG output:75]  General appearance: alert and cooperative Resp: clear to auscultation bilaterally Cardio: regular rate and rhythm GI: soft, minimal tenderness. few bs  Lab Results:   Recent Labs  12/05/16 0424 12/06/16 0359  WBC 12.1* 11.1*  HGB 9.5* 10.8*  HCT 28.6* 31.5*  PLT 344 352   BMET  Recent Labs  12/05/16 0424 12/06/16 0359  NA 131* 130*  K 4.3 4.1  CL 99* 96*  CO2 25 26  GLUCOSE 269* 217*  BUN 9 6  CREATININE 0.82 0.72  CALCIUM 7.9* 8.4*   PT/INR No results for input(s): LABPROT, INR in the last 72 hours. ABG No results for input(s): PHART, HCO3 in the last 72 hours.  Invalid input(s): PCO2, PO2  Studies/Results: No results found.  Anti-infectives: Anti-infectives    Start     Dose/Rate Route Frequency Ordered Stop   12/01/16 1200  cefTRIAXone (ROCEPHIN) 1 g in dextrose 5 % 50 mL IVPB  Status:  Discontinued     1 g 100 mL/hr over 30 Minutes Intravenous Every 24 hours 12/01/16 1021 12/03/16 1732   11/30/16 2200  cephALEXin (KEFLEX) capsule 500 mg  Status:  Discontinued     500 mg Oral Every 12 hours 11/30/16 1141 12/01/16 1021   11/30/16 0000  cephALEXin (KEFLEX) 500 MG capsule     500 mg Oral Every 12 hours 11/30/16 1221 12/03/16 2359   11/26/16 1800  vancomycin (VANCOCIN) IVPB 750 mg/150 ml premix  Status:  Discontinued     750 mg 150 mL/hr over 60 Minutes Intravenous Every 24 hours 11/25/16 1826 11/26/16 0819   11/26/16 1000   cefTRIAXone (ROCEPHIN) 1 g in dextrose 5 % 50 mL IVPB  Status:  Discontinued     1 g 100 mL/hr over 30 Minutes Intravenous Every 24 hours 11/26/16 0819 11/30/16 1141   11/26/16 0000  piperacillin-tazobactam (ZOSYN) IVPB 3.375 g  Status:  Discontinued     3.375 g 12.5 mL/hr over 240 Minutes Intravenous Every 8 hours 11/25/16 1826 11/26/16 0819   11/25/16 1815  vancomycin (VANCOCIN) IVPB 1000 mg/200 mL premix     1,000 mg 200 mL/hr over 60 Minutes Intravenous  Once 11/25/16 1807 11/25/16 1926   11/25/16 1815  piperacillin-tazobactam (ZOSYN) IVPB 3.375 g     3.375 g 100 mL/hr over 30 Minutes Intravenous  Once 11/25/16 1807 11/25/16 1919      Assessment/Plan: s/p Procedure(s): EXPLORATORY LAPAROTOMY, LYSIS OF ADHESIONS (N/A) Continue ng and bowel rest until bowel function returns  Ambulate POD 2 LOA  LOS: 11 days    TOTH III,PAUL S 12/06/2016

## 2016-12-06 NOTE — Progress Notes (Signed)
PROGRESS NOTE    [name] [name]  XIP:[id number] DOB: 05-Apr-1934 DOA: 11/25/2016 PCP: [name] Dials, MD   Chief Complaint  Patient presents with  . Altered Mental Status  . Hematuria    Brief Narrative:  HPI On 11/25/2016 by Dr. Myrene Buddy [name] [name] is a 83 y.o. female with a past medical history significant for IDDM, hypothyroidism and HTN who presents with 2 days abdominal pain, malaise and now confusion. All history is collected from the patient's daughter, as the patient is obtunded. Per daughter on Monday patient saw her primary doctor who decreased her insulin because of night time lows or highs (she is not sure). Then Tuesday evening on the phone, the patient seemed a little bit "agitated" or "tired" and later that night she called her daughter complaining of some abdominal discomfort, "upset stomach", and asked to be taken to the emergency room.  In the ER, she was quite hyperglycemic but bicarb and gap normal, no leukocytosis, normal renal function.  Lipase elevated but CT unremarkable except moderate ascites.  It seems she felt better and was prepared for discharge, only out of the ordinary finding that she was given a foley bag for retained urine. Per daughter, she slep this morning, and then in the afternoon got up and was altered, totally confused, pulled out her foley and had some gross hematuria (at baseline she lives alone, drives, does her finances online, is totally independent, manages her own meds).  Daughter is aware of no fever, cough, sputum production, dyrsuria, urinary irritation, recent diarrhea (although she has episodes from time to time).  Interim history Patient admitted with sepsis and DKA. Was also found to have acute urinary retention, Foley catheter was placed, failed voiding trial. Patient was also noted to have ileus with bowel traction. General surgery consulted. S/p exploratory laparotomy with lysis of adhesions.  Assessment & Plan    DKA/diabetes mellitus, type II -It was thought that this was a due to patient decreasing her insulin over the past week prior to admission, vs infection -patient was on metformin at home -Resolved, patient did require glucose stabilizer however was transitioned to levemir and ISS  Sepsis secondary to urinary tract infection -Patient didn't have leukocytosis, low-grade temperature, elevated lactic acid, on admission. -Lactic acidosis was thought to be exacerbated by metformin -Patient was started on vancomycin and Zosyn, currently on ceftriaxone- has received 7 days of antibiotic coverage -Will discontinue today -urine culture showed multiple species, subsequent urine culture showed no growth -Blood culture showed no growth to date  Urinary retention -Required Foley catheter placement, patient did fail a voiding trial -May try voiding trial in 1-2 days as patient's SBO continues to improve  Acute kidney injury with hyperkalemia -Creatinine was 1.57 on admission, currently 0.72 -Resolved with IV fluids -Hyperkalemia resolved  Hypokalemia -Resolved with supplementation.  -Continue to monitor BMP  Metabolic encephalopathy -Appears to be at baseline per daughter at bedside  Hypothyroidism -Continue Synthroid  Dyslipidemia -Continue statin when tolerating PO   Abdominal pain/Small bowel obstruction -Upon admission, patient did have elevated lipase at 77 which digested pancreatitis however CT scan was normal -Suspected cause was DKA -CT abdomen and pelvis was done on 11/25/2016 showing moderate diffuse abdominal and pelvic ascites, possible gastric wall thickening which could be under distention. No evidence of bowel obstruction -CT abdomen and pelvis on 12/01/2016 showed small bowel obstruction with diffusely dilated fluid-filled small bowel throughout the abdomen and pelvis, 2 possible sites of obstruction and the pelvis raising possibility  of closed loop obstruction. Moderate  amount of ascites in the abdomen and pelvis -Patient does have NG tube in place -Continue IV fluids, antiemetics -Gen. surgery consulted and appreciated, pending further recommendations, may need laparotomy. -Abdominal Xray 5/17 showed persistent unchanged SBO. -S/p exploratory laparotomy with lysis of adhesions on 12/03/16 -Continues to improve, however, no flatus or bowel movement  Elevated troponin -Possible demand ischemia from DKA, sepsis -Troponin up to 0.44 -Echocardiogram showed EF of 65-70%, no regional wall motion abnormalities, grade 1 diastolic dysfunction   Leukocytosis -Suspect reactive to recent surgery -Currently afebrile wit no complaints of respiratory symptoms -WBC trending donward, currently 11.1 -Will continue to monitor CBC  Essential hypertension -continue IV hydralazine as needed  Severe malnutrition -in the context of chronic illness -nutrition consulted -currently NPO given recent SBO  DVT Prophylaxis  lovenox  Code Status: Full  Family Communication: Daughter at bedside  Disposition Plan: Admitted. Continues to have NG tube, possible discharge within 72 hours to SNF  Consultants General surgery  Procedures  Echocardiogram exploratory laparotomy with lysis of adhesions  Antibiotics   Anti-infectives    Start     Dose/Rate Route Frequency Ordered Stop   12/01/16 1200  cefTRIAXone (ROCEPHIN) 1 g in dextrose 5 % 50 mL IVPB  Status:  Discontinued     1 g 100 mL/hr over 30 Minutes Intravenous Every 24 hours 12/01/16 1021 12/03/16 1732   11/30/16 2200  cephALEXin (KEFLEX) capsule 500 mg  Status:  Discontinued     500 mg Oral Every 12 hours 11/30/16 1141 12/01/16 1021   11/30/16 0000  cephALEXin (KEFLEX) 500 MG capsule     500 mg Oral Every 12 hours 11/30/16 1221 12/03/16 2359   11/26/16 1800  vancomycin (VANCOCIN) IVPB 750 mg/150 ml premix  Status:  Discontinued     750 mg 150 mL/hr over 60 Minutes Intravenous Every 24 hours 11/25/16 1826  11/26/16 0819   11/26/16 1000  cefTRIAXone (ROCEPHIN) 1 g in dextrose 5 % 50 mL IVPB  Status:  Discontinued     1 g 100 mL/hr over 30 Minutes Intravenous Every 24 hours 11/26/16 0819 11/30/16 1141   11/26/16 0000  piperacillin-tazobactam (ZOSYN) IVPB 3.375 g  Status:  Discontinued     3.375 g 12.5 mL/hr over 240 Minutes Intravenous Every 8 hours 11/25/16 1826 11/26/16 0819   11/25/16 1815  vancomycin (VANCOCIN) IVPB 1000 mg/200 mL premix     1,000 mg 200 mL/hr over 60 Minutes Intravenous  Once 11/25/16 1807 11/25/16 1926   11/25/16 1815  piperacillin-tazobactam (ZOSYN) IVPB 3.375 g     3.375 g 100 mL/hr over 30 Minutes Intravenous  Once 11/25/16 1807 11/25/16 1919      Subjective:   [name] [name] seen and examined today. Patient feels better today. Still complains of abdominal soreness. Denies any gas passage or bowel movement. Denies any chest pain, shortness of breath, dizziness or headache.   Objective:   Vitals:   12/05/16 0422 12/05/16 1500 12/05/16 2121 12/06/16 0457  BP: (!) 143/58 (!) 155/60 (!) 165/75 (!) 148/58  Pulse: 79 82 83 86  Resp: 16 16 18 18   Temp: 98.6 F (37 C) 98.8 F (37.1 C) 98.2 F (36.8 C) 98.6 F (37 C)  TempSrc: Oral Oral Oral Oral  SpO2: 94% 96% 94% 96%  Weight:      Height:        Intake/Output Summary (Last 24 hours) at 12/06/16 1247 Last data filed at 12/06/16 0713  Gross per 24 hour  Intake                0 ml  Output               75 ml  Net              -75 ml   Filed Weights   11/25/16 1757 11/26/16 0017 11/29/16 1107  Weight: 63.5 kg (140 lb) 61.1 kg (134 lb 11.2 oz) 62.1 kg (137 lb)   Exam  General: Well developed, well nourished, NAD, appears stated age  75: NCAT, mucous membranes moist. NG tube in place  Cardiovascular: S1 S2 auscultated, no rubs, murmurs or gallops. Regular rate and rhythm.  Respiratory: Clear to auscultation bilaterally with equal chest rise  Abdomen: Soft, mildly TTP, nondistended, few bowel  sounds, honeycomb dressing in place  Extremities: warm dry without cyanosis clubbing or edema  Neuro: AAOx3, nonfocal  Psych: Normal affect and demeanor, pleasant  Data Reviewed: I have personally reviewed following labs and imaging studies  CBC:  Recent Labs Lab 12/01/16 1718 12/03/16 0348 12/04/16 0355 12/05/16 0424 12/06/16 0359  WBC 9.8 10.6* 20.8* 12.1* 11.1*  HGB 12.2 10.8* 10.9* 9.5* 10.8*  HCT 36.1 31.4* 31.6* 28.6* 31.5*  MCV 90.5 89.7 89.5 91.7 90.3  PLT 354 291 348 344 539   Basic Metabolic Panel:  Recent Labs Lab 12/01/16 1718 12/03/16 0348 12/04/16 0355 12/05/16 0424 12/06/16 0359  NA 128* 129* 132* 131* 130*  K 3.5 3.3* 4.3 4.3 4.1  CL 92* 94* 97* 99* 96*  CO2 24 25 26 25 26   GLUCOSE 164* 148* 201* 269* 217*  BUN 10 9 9 9 6   CREATININE 0.76 0.72 0.77 0.82 0.72  CALCIUM 9.2 8.5* 8.4* 7.9* 8.4*   GFR: Estimated Creatinine Clearance: 54.5 mL/min (by C-G formula based on SCr of 0.72 mg/dL). Liver Function Tests: No results for input(s): AST, ALT, ALKPHOS, BILITOT, PROT, ALBUMIN in the last 168 hours. No results for input(s): LIPASE, AMYLASE in the last 168 hours. No results for input(s): AMMONIA in the last 168 hours. Coagulation Profile: No results for input(s): INR, PROTIME in the last 168 hours. Cardiac Enzymes: No results for input(s): CKTOTAL, CKMB, CKMBINDEX, TROPONINI in the last 168 hours. BNP (last 3 results) No results for input(s): PROBNP in the last 8760 hours. HbA1C: No results for input(s): HGBA1C in the last 72 hours. CBG:  Recent Labs Lab 12/05/16 1937 12/05/16 2320 12/06/16 0410 12/06/16 0753 12/06/16 1215  GLUCAP 211* 209* 199* 193* 225*   Lipid Profile: No results for input(s): CHOL, HDL, LDLCALC, TRIG, CHOLHDL, LDLDIRECT in the last 72 hours. Thyroid Function Tests: No results for input(s): TSH, T4TOTAL, FREET4, T3FREE, THYROIDAB in the last 72 hours. Anemia Panel: No results for input(s): VITAMINB12, FOLATE,  FERRITIN, TIBC, IRON, RETICCTPCT in the last 72 hours. Urine analysis:    Component Value Date/Time   COLORURINE RED (A) 11/25/2016 1507   APPEARANCEUR HAZY (A) 11/25/2016 1507   LABSPEC 1.024 11/25/2016 1507   PHURINE 5.0 11/25/2016 1507   GLUCOSEU >=500 (A) 11/25/2016 1507   HGBUR LARGE (A) 11/25/2016 1507   BILIRUBINUR NEGATIVE 11/25/2016 1507   BILIRUBINUR neg 05/06/2015 1421   KETONESUR 20 (A) 11/25/2016 1507   PROTEINUR 100 (A) 11/25/2016 1507   UROBILINOGEN 1.0 05/06/2015 1421   UROBILINOGEN 0.2 12/25/2014 1206   NITRITE NEGATIVE 11/25/2016 1507   LEUKOCYTESUR NEGATIVE 11/25/2016 1507   Sepsis Labs: @LABRCNTIP (procalcitonin:4,lacticidven:4)  ) Recent Results (from the past 240 hour(s))  MRSA PCR  Screening     Status: None   Collection Time: 12/03/16  1:40 PM  Result Value Ref Range Status   MRSA by PCR NEGATIVE NEGATIVE Final    Comment:        The GeneXpert MRSA Assay (FDA approved for NASAL specimens only), is one component of a comprehensive MRSA colonization surveillance program. It is not intended to diagnose MRSA infection nor to guide or monitor treatment for MRSA infections.       Radiology Studies: No results found.   Scheduled Meds: . enoxaparin (LOVENOX) injection  40 mg Subcutaneous Q24H  . insulin aspart  0-15 Units Subcutaneous Q4H  . mouth rinse  15 mL Mouth Rinse BID  . pantoprazole (PROTONIX) IV  40 mg Intravenous QHS   Continuous Infusions: . dextrose 5 % and 0.45 % NaCl with KCl 20 mEq/L 100 mL/hr at 12/06/16 0914     LOS: 11 days   Time Spent in minutes   30 minutes  Lavin Petteway D.O. on 12/06/2016 at 12:47 PM  Between 7am to 7pm - Pager - [phone]  After 7pm go to [email or web address] - password [email or web address]  And look for the night coverage person covering for me after hours  [facility] Office  [phone]

## 2016-12-07 DIAGNOSIS — M25551 Pain in right hip: Secondary | ICD-10-CM

## 2016-12-07 DIAGNOSIS — Z0189 Encounter for other specified special examinations: Secondary | ICD-10-CM

## 2016-12-07 LAB — GLUCOSE, CAPILLARY
GLUCOSE-CAPILLARY: 211 mg/dL — AB (ref 65–99)
GLUCOSE-CAPILLARY: 226 mg/dL — AB (ref 65–99)
GLUCOSE-CAPILLARY: 296 mg/dL — AB (ref 65–99)
GLUCOSE-CAPILLARY: 319 mg/dL — AB (ref 65–99)
Glucose-Capillary: 245 mg/dL — ABNORMAL HIGH (ref 65–99)
Glucose-Capillary: 161 mg/dL — ABNORMAL HIGH (ref 65–99)

## 2016-12-07 LAB — BASIC METABOLIC PANEL
ANION GAP: 6 (ref 5–15)
CALCIUM: 8.6 mg/dL — AB (ref 8.9–10.3)
CO2: 28 mmol/L (ref 22–32)
Chloride: 97 mmol/L — ABNORMAL LOW (ref 101–111)
Creatinine, Ser: 0.68 mg/dL (ref 0.44–1.00)
GFR calc Af Amer: >60 mL/min (ref >60)
GLUCOSE: 258 mg/dL — AB (ref 65–99)
POTASSIUM: 3.8 mmol/L (ref 3.5–5.1)
SODIUM: 131 mmol/L — AB (ref 135–145)

## 2016-12-07 LAB — CBC
HCT: 31.5 % — ABNORMAL LOW (ref 36.0–46.0)
Hemoglobin: 10.5 g/dL — ABNORMAL LOW (ref 12.0–15.0)
MCH: 30 pg (ref 26.0–34.0)
MCHC: 33.3 g/dL (ref 30.0–36.0)
MCV: 90 fL (ref 78.0–100.0)
PLATELETS: 379 10*3/uL (ref 150–400)
RBC: 3.5 MIL/uL — AB (ref 3.87–5.11)
RDW: 13.4 % (ref 11.5–15.5)
WBC: 9.3 10*3/uL (ref 4.0–10.5)

## 2016-12-07 MED ORDER — INSULIN DETEMIR 100 UNIT/ML ~~LOC~~ SOLN
4.0000 [IU] | Freq: Two times a day (BID) | SUBCUTANEOUS | Status: DC
  Administered 2016-12-07 – 2016-12-09 (×5): 4 [IU] via SUBCUTANEOUS
  Filled 2016-12-07 (×6): qty 0.04

## 2016-12-07 NOTE — Progress Notes (Signed)
Inpatient Diabetes Program Recommendations  AACE/ADA: New Consensus Statement on Inpatient Glycemic Control (2015)  Target Ranges:  Prepandial:   less than 140 mg/dL      Peak postprandial:   less than 180 mg/dL (1-2 hours)      Critically ill patients:  140 - 180 mg/dL   Review of Glycemic Control  Diabetes history: DM 2 Outpatient Diabetes medications: Novolog 7 units breakfast 7 units Lunch, 8 units supper, Levemir 8 units BID6 units tid with meals, Metformin 1000 mg BID Current orders for Inpatient glycemic control: Novolog Moderate Correction Q4hours.  A1c 8.9% on 11/27/16  Inpatient Diabetes Program Recommendations:    Glucose elevated in the 200's.  Consider Levemir 4 units BID (Half of home dose)  Thanks,  Tama Headings RN, MSN, [facility] Inpatient Diabetes Coordinator Team Pager [phone] (8a-5p)

## 2016-12-07 NOTE — Progress Notes (Signed)
4 Days Post-Op   Subjective/Chief Complaint: No complaints. Flatus and BM started overnight per family.    Objective: Vital signs in last 24 hours: Temp:  [98.8 F (37.1 C)-99.1 F (37.3 C)] 98.8 F (37.1 C) (05/21 0445) Pulse Rate:  [74-82] 74 (05/21 0445) Resp:  [18-20] 20 (05/21 0445) BP: (147-159)/(58-64) 147/58 (05/21 0445) SpO2:  [95 %-98 %] 97 % (05/21 0445) Last BM Date: 12/07/16  Intake/Output from previous day: 05/20 0701 - 05/21 0700 In: 4880 [P.O.:180; I.V.:4700] Out: 281 [Urine:6; Emesis/NG output:275] Intake/Output this shift: No intake/output data recorded.  General appearance: alert and cooperative Resp: clear to auscultation bilaterally Cardio: regular rate and rhythm GI: soft, minimal tenderness. few bs  Lab Results:   Recent Labs  12/06/16 0359 12/07/16 0357  WBC 11.1* 9.3  HGB 10.8* 10.5*  HCT 31.5* 31.5*  PLT 352 379   BMET  Recent Labs  12/06/16 0359 12/07/16 0357  NA 130* 131*  K 4.1 3.8  CL 96* 97*  CO2 26 28  GLUCOSE 217* 258*  BUN 6 <5*  CREATININE 0.72 0.68  CALCIUM 8.4* 8.6*   PT/INR No results for input(s): LABPROT, INR in the last 72 hours. ABG No results for input(s): PHART, HCO3 in the last 72 hours.  Invalid input(s): PCO2, PO2  Studies/Results: No results found.  Anti-infectives: Anti-infectives    Start     Dose/Rate Route Frequency Ordered Stop   12/01/16 1200  cefTRIAXone (ROCEPHIN) 1 g in dextrose 5 % 50 mL IVPB  Status:  Discontinued     1 g 100 mL/hr over 30 Minutes Intravenous Every 24 hours 12/01/16 1021 12/03/16 1732   11/30/16 2200  cephALEXin (KEFLEX) capsule 500 mg  Status:  Discontinued     500 mg Oral Every 12 hours 11/30/16 1141 12/01/16 1021   11/30/16 0000  cephALEXin (KEFLEX) 500 MG capsule     500 mg Oral Every 12 hours 11/30/16 1221 12/03/16 2359   11/26/16 1800  vancomycin (VANCOCIN) IVPB 750 mg/150 ml premix  Status:  Discontinued     750 mg 150 mL/hr over 60 Minutes Intravenous Every  24 hours 11/25/16 1826 11/26/16 0819   11/26/16 1000  cefTRIAXone (ROCEPHIN) 1 g in dextrose 5 % 50 mL IVPB  Status:  Discontinued     1 g 100 mL/hr over 30 Minutes Intravenous Every 24 hours 11/26/16 0819 11/30/16 1141   11/26/16 0000  piperacillin-tazobactam (ZOSYN) IVPB 3.375 g  Status:  Discontinued     3.375 g 12.5 mL/hr over 240 Minutes Intravenous Every 8 hours 11/25/16 1826 11/26/16 0819   11/25/16 1815  vancomycin (VANCOCIN) IVPB 1000 mg/200 mL premix     1,000 mg 200 mL/hr over 60 Minutes Intravenous  Once 11/25/16 1807 11/25/16 1926   11/25/16 1815  piperacillin-tazobactam (ZOSYN) IVPB 3.375 g     3.375 g 100 mL/hr over 30 Minutes Intravenous  Once 11/25/16 1807 11/25/16 1919      Assessment/Plan: s/p Procedure(s): EXPLORATORY LAPAROTOMY, LYSIS OF ADHESIONS (N/A) Clamp NG today, OK for limited sips Ambulate POD 2 LOA  LOS: 12 days    Clovis Riley 12/07/2016

## 2016-12-07 NOTE — Care Management Important Message (Signed)
Important Message  Patient Details  Name: SALIHAH PECKHAM MRN: [id number] Date of Birth: 08-25-1933   Medicare Important Message Given:  Yes    Kerin Salen 12/07/2016, 11:03 AMImportant Message  Patient Details  Name: ETERNITY DEXTER MRN: [id number] Date of Birth: 1934/02/22   Medicare Important Message Given:  Yes    Kerin Salen 12/07/2016, 11:03 AM

## 2016-12-07 NOTE — Progress Notes (Signed)
PROGRESS NOTE    [name] [name]  GEZ:[id number] DOB: 11/06/1933 DOA: 11/25/2016 PCP: [name] Dials, MD   Chief Complaint  Patient presents with  . Altered Mental Status  . Hematuria    Brief Narrative:  HPI On 11/25/2016 by Dr. Myrene Buddy WETONA [name] is a 83 y.o. female with a past medical history significant for IDDM, hypothyroidism and HTN who presents with 2 days abdominal pain, malaise and now confusion. All history is collected from the patient's daughter, as the patient is obtunded. Per daughter on Monday patient saw her primary doctor who decreased her insulin because of night time lows or highs (she is not sure). Then Tuesday evening on the phone, the patient seemed a little bit "agitated" or "tired" and later that night she called her daughter complaining of some abdominal discomfort, "upset stomach", and asked to be taken to the emergency room.  In the ER, she was quite hyperglycemic but bicarb and gap normal, no leukocytosis, normal renal function.  Lipase elevated but CT unremarkable except moderate ascites.  It seems she felt better and was prepared for discharge, only out of the ordinary finding that she was given a foley bag for retained urine. Per daughter, she slep this morning, and then in the afternoon got up and was altered, totally confused, pulled out her foley and had some gross hematuria (at baseline she lives alone, drives, does her finances online, is totally independent, manages her own meds).  Daughter is aware of no fever, cough, sputum production, dyrsuria, urinary irritation, recent diarrhea (although she has episodes from time to time).  Interim history Patient admitted with sepsis and DKA. Was also found to have acute urinary retention, Foley catheter was placed, failed voiding trial. Patient was also noted to have ileus with bowel traction. General surgery consulted. S/p exploratory laparotomy with lysis of adhesions.   Assessment & Plan    DKA/diabetes mellitus, type II -It was thought that this was a due to patient decreasing her insulin over the past week prior to admission, vs infection -patient was on metformin at home -Resolved, patient did require glucose stabilizer however was transitioned to levemir and ISS -Added levemir 4u BID today  Sepsis secondary to urinary tract infection -Patient didn't have leukocytosis, low-grade temperature, elevated lactic acid, on admission. -Lactic acidosis was thought to be exacerbated by metformin -Patient was started on vancomycin and Zosyn, currently on ceftriaxone- has received 7 days of antibiotic coverage -Antibiotics discontinued on 5/20 -urine culture showed multiple species, subsequent urine culture showed no growth -Blood culture showed no growth to date  Urinary retention -Required Foley catheter placement, patient did fail a voiding trial -May try voiding trial in 1-2 days as patient's SBO continues to improve  Acute kidney injury with hyperkalemia -Creatinine was 1.57 on admission, currently 0.68 -Resolved with IV fluids -Hyperkalemia resolved  Hypokalemia -Resolved with supplementation.  -Continue to monitor BMP  Metabolic encephalopathy -Appears to be at baseline per daughter at bedside  Hypothyroidism -Continue Synthroid  Dyslipidemia -Continue statin when tolerating PO   Abdominal pain/Small bowel obstruction -Upon admission, patient did have elevated lipase at 77 which digested pancreatitis however CT scan was normal -Suspected cause was DKA -CT abdomen and pelvis was done on 11/25/2016 showing moderate diffuse abdominal and pelvic ascites, possible gastric wall thickening which could be under distention. No evidence of bowel obstruction -CT abdomen and pelvis on 12/01/2016 showed small bowel obstruction with diffusely dilated fluid-filled small bowel throughout the abdomen and pelvis, 2 possible sites  of obstruction and the pelvis raising possibility  of closed loop obstruction. Moderate amount of ascites in the abdomen and pelvis -Patient does have NG tube in place -Continue IV fluids, antiemetics -Gen. surgery consulted and appreciated, pending further recommendations, may need laparotomy. -Abdominal Xray 5/17 showed persistent unchanged SBO. -S/p exploratory laparotomy with lysis of adhesions on 12/03/16 -Plan to clamp the NG tube today as patient was able to have bowel movement overnight  Elevated troponin -Possible demand ischemia from DKA, sepsis -Troponin up to 0.44 -Echocardiogram showed EF of 65-70%, no regional wall motion abnormalities, grade 1 diastolic dysfunction   Leukocytosis -Suspect reactive to recent surgery -Currently afebrile wit no complaints of respiratory symptoms -WBC trending donward, currently 9.3 -Will continue to monitor CBC  Essential hypertension -continue IV hydralazine as needed  Severe malnutrition -in the context of chronic illness -nutrition consulted -currently NPO given recent SBO  DVT Prophylaxis  lovenox  Code Status: Full  Family Communication: Daughter at bedside  Disposition Plan: Admitted. Continues to have NG tube, clamping today.  Consultants General surgery  Procedures  Echocardiogram exploratory laparotomy with lysis of adhesions  Antibiotics   Anti-infectives    Start     Dose/Rate Route Frequency Ordered Stop   12/01/16 1200  cefTRIAXone (ROCEPHIN) 1 g in dextrose 5 % 50 mL IVPB  Status:  Discontinued     1 g 100 mL/hr over 30 Minutes Intravenous Every 24 hours 12/01/16 1021 12/03/16 1732   11/30/16 2200  cephALEXin (KEFLEX) capsule 500 mg  Status:  Discontinued     500 mg Oral Every 12 hours 11/30/16 1141 12/01/16 1021   11/30/16 0000  cephALEXin (KEFLEX) 500 MG capsule     500 mg Oral Every 12 hours 11/30/16 1221 12/03/16 2359   11/26/16 1800  vancomycin (VANCOCIN) IVPB 750 mg/150 ml premix  Status:  Discontinued     750 mg 150 mL/hr over 60 Minutes  Intravenous Every 24 hours 11/25/16 1826 11/26/16 0819   11/26/16 1000  cefTRIAXone (ROCEPHIN) 1 g in dextrose 5 % 50 mL IVPB  Status:  Discontinued     1 g 100 mL/hr over 30 Minutes Intravenous Every 24 hours 11/26/16 0819 11/30/16 1141   11/26/16 0000  piperacillin-tazobactam (ZOSYN) IVPB 3.375 g  Status:  Discontinued     3.375 g 12.5 mL/hr over 240 Minutes Intravenous Every 8 hours 11/25/16 1826 11/26/16 0819   11/25/16 1815  vancomycin (VANCOCIN) IVPB 1000 mg/200 mL premix     1,000 mg 200 mL/hr over 60 Minutes Intravenous  Once 11/25/16 1807 11/25/16 1926   11/25/16 1815  piperacillin-tazobactam (ZOSYN) IVPB 3.375 g     3.375 g 100 mL/hr over 30 Minutes Intravenous  Once 11/25/16 1807 11/25/16 1919      Subjective:   Riesa Pope seen and examined today. Patient feeling better today. Was able to have bowel movement overnight. Currently denies chest pain, shortness breath, headaches, dizziness.   Objective:   Vitals:   12/06/16 0457 12/06/16 1441 12/06/16 2043 12/07/16 0445  BP: (!) 148/58 (!) 152/58 (!) 159/64 (!) 147/58  Pulse: 86 82 76 74  Resp: 18 18 20 20   Temp: 98.6 F (37 C) 98.8 F (37.1 C) 99.1 F (37.3 C) 98.8 F (37.1 C)  TempSrc: Oral Oral Oral Oral  SpO2: 96% 98% 95% 97%  Weight:      Height:        Intake/Output Summary (Last 24 hours) at 12/07/16 1139 Last data filed at 12/07/16 0655  Gross per  24 hour  Intake             4880 ml  Output              206 ml  Net             4674 ml   Filed Weights   11/25/16 1757 11/26/16 0017 11/29/16 1107  Weight: 63.5 kg (140 lb) 61.1 kg (134 lb 11.2 oz) 62.1 kg (137 lb)   Exam  General: Well developed, well nourished, no distress  HEENT: NCAT, mucous membranes moist. NG tube in place  Cardiovascular: S1 S2 auscultated, RRR, no murmurs  Respiratory: Clear to auscultation bilaterally   Abdomen: Soft, mildly TTP,, nondistended, + few bowel sounds, honeycomb dressing in place  Extremities: warm dry  without cyanosis clubbing or edema  Neuro: AAOx3, nonfocal  Psych: appropriate mood and affect, pleasant  Data Reviewed: I have personally reviewed following labs and imaging studies  CBC:  Recent Labs Lab 12/03/16 0348 12/04/16 0355 12/05/16 0424 12/06/16 0359 12/07/16 0357  WBC 10.6* 20.8* 12.1* 11.1* 9.3  HGB 10.8* 10.9* 9.5* 10.8* 10.5*  HCT 31.4* 31.6* 28.6* 31.5* 31.5*  MCV 89.7 89.5 91.7 90.3 90.0  PLT 291 348 344 352 631   Basic Metabolic Panel:  Recent Labs Lab 12/03/16 0348 12/04/16 0355 12/05/16 0424 12/06/16 0359 12/07/16 0357  NA 129* 132* 131* 130* 131*  K 3.3* 4.3 4.3 4.1 3.8  CL 94* 97* 99* 96* 97*  CO2 25 26 25 26 28   GLUCOSE 148* 201* 269* 217* 258*  BUN 9 9 9 6  <5*  CREATININE 0.72 0.77 0.82 0.72 0.68  CALCIUM 8.5* 8.4* 7.9* 8.4* 8.6*   GFR: Estimated Creatinine Clearance: 54.5 mL/min (by C-G formula based on SCr of 0.68 mg/dL). Liver Function Tests: No results for input(s): AST, ALT, ALKPHOS, BILITOT, PROT, ALBUMIN in the last 168 hours. No results for input(s): LIPASE, AMYLASE in the last 168 hours. No results for input(s): AMMONIA in the last 168 hours. Coagulation Profile: No results for input(s): INR, PROTIME in the last 168 hours. Cardiac Enzymes: No results for input(s): CKTOTAL, CKMB, CKMBINDEX, TROPONINI in the last 168 hours. BNP (last 3 results) No results for input(s): PROBNP in the last 8760 hours. HbA1C: No results for input(s): HGBA1C in the last 72 hours. CBG:  Recent Labs Lab 12/06/16 1635 12/06/16 2027 12/07/16 0048 12/07/16 0440 12/07/16 0739  GLUCAP 229* 232* 226* 211* 245*   Lipid Profile: No results for input(s): CHOL, HDL, LDLCALC, TRIG, CHOLHDL, LDLDIRECT in the last 72 hours. Thyroid Function Tests: No results for input(s): TSH, T4TOTAL, FREET4, T3FREE, THYROIDAB in the last 72 hours. Anemia Panel: No results for input(s): VITAMINB12, FOLATE, FERRITIN, TIBC, IRON, RETICCTPCT in the last 72 hours. Urine  analysis:    Component Value Date/Time   COLORURINE RED (A) 11/25/2016 1507   APPEARANCEUR HAZY (A) 11/25/2016 1507   LABSPEC 1.024 11/25/2016 1507   PHURINE 5.0 11/25/2016 1507   GLUCOSEU >=500 (A) 11/25/2016 1507   HGBUR LARGE (A) 11/25/2016 1507   BILIRUBINUR NEGATIVE 11/25/2016 1507   BILIRUBINUR neg 05/06/2015 1421   KETONESUR 20 (A) 11/25/2016 1507   PROTEINUR 100 (A) 11/25/2016 1507   UROBILINOGEN 1.0 05/06/2015 1421   UROBILINOGEN 0.2 12/25/2014 1206   NITRITE NEGATIVE 11/25/2016 1507   LEUKOCYTESUR NEGATIVE 11/25/2016 1507   Sepsis Labs: @LABRCNTIP (procalcitonin:4,lacticidven:4)  ) Recent Results (from the past 240 hour(s))  MRSA PCR Screening     Status: None   Collection Time: 12/03/16  1:40 PM  Result Value Ref Range Status   MRSA by PCR NEGATIVE NEGATIVE Final    Comment:        The GeneXpert MRSA Assay (FDA approved for NASAL specimens only), is one component of a comprehensive MRSA colonization surveillance program. It is not intended to diagnose MRSA infection nor to guide or monitor treatment for MRSA infections.       Radiology Studies: No results found.   Scheduled Meds: . enoxaparin (LOVENOX) injection  40 mg Subcutaneous Q24H  . insulin aspart  0-15 Units Subcutaneous Q4H  . mouth rinse  15 mL Mouth Rinse BID  . pantoprazole (PROTONIX) IV  40 mg Intravenous QHS   Continuous Infusions: . dextrose 5 % and 0.45 % NaCl with KCl 20 mEq/L 100 mL/hr at 12/06/16 1822     LOS: 12 days   Time Spent in minutes   30 minutes  Vester Titsworth D.O. on 12/07/2016 at 11:39 AM  Between 7am to 7pm - Pager - [phone]  After 7pm go to [email or web address] - password [email or web address]  And look for the night coverage person covering for me after hours  [facility] Office  [phone]

## 2016-12-08 LAB — BASIC METABOLIC PANEL
Anion gap: 8 (ref 5–15)
CHLORIDE: 99 mmol/L — AB (ref 101–111)
CO2: 24 mmol/L (ref 22–32)
CREATININE: 0.69 mg/dL (ref 0.44–1.00)
Calcium: 8.3 mg/dL — ABNORMAL LOW (ref 8.9–10.3)
GFR calc non Af Amer: >60 mL/min (ref >60)
Glucose, Bld: 133 mg/dL — ABNORMAL HIGH (ref 65–99)
POTASSIUM: 3.7 mmol/L (ref 3.5–5.1)
Sodium: 131 mmol/L — ABNORMAL LOW (ref 135–145)

## 2016-12-08 LAB — CBC
HEMATOCRIT: 32.1 % — AB (ref 36.0–46.0)
Hemoglobin: 10.8 g/dL — ABNORMAL LOW (ref 12.0–15.0)
MCH: 30.1 pg (ref 26.0–34.0)
MCHC: 33.6 g/dL (ref 30.0–36.0)
MCV: 89.4 fL (ref 78.0–100.0)
Platelets: 371 10*3/uL (ref 150–400)
RBC: 3.59 MIL/uL — ABNORMAL LOW (ref 3.87–5.11)
RDW: 13.2 % (ref 11.5–15.5)
WBC: 9.1 10*3/uL (ref 4.0–10.5)

## 2016-12-08 LAB — GLUCOSE, CAPILLARY
Glucose-Capillary: 102 mg/dL — ABNORMAL HIGH (ref 65–99)
Glucose-Capillary: 112 mg/dL — ABNORMAL HIGH (ref 65–99)
Glucose-Capillary: 117 mg/dL — ABNORMAL HIGH (ref 65–99)
Glucose-Capillary: 224 mg/dL — ABNORMAL HIGH (ref 65–99)
Glucose-Capillary: 248 mg/dL — ABNORMAL HIGH (ref 65–99)
Glucose-Capillary: 281 mg/dL — ABNORMAL HIGH (ref 65–99)

## 2016-12-08 MED ORDER — MORPHINE SULFATE (PF) 4 MG/ML IV SOLN
1.0000 mg | INTRAVENOUS | Status: DC | PRN
  Administered 2016-12-08: 1 mg via INTRAVENOUS
  Filled 2016-12-08: qty 1

## 2016-12-08 MED ORDER — BOOST / RESOURCE BREEZE PO LIQD
1.0000 | Freq: Three times a day (TID) | ORAL | Status: DC
  Administered 2016-12-08 – 2016-12-09 (×3): 1 via ORAL

## 2016-12-08 MED ORDER — TRAMADOL HCL 50 MG PO TABS: 50.0000 mg | ORAL_TABLET | Freq: Four times a day (QID) | ORAL | Status: DC | PRN

## 2016-12-08 MED ORDER — ACETAMINOPHEN 325 MG PO TABS: 650.0000 mg | ORAL_TABLET | Freq: Four times a day (QID) | ORAL | Status: DC | PRN

## 2016-12-08 NOTE — Progress Notes (Addendum)
PROGRESS NOTE    [name] [name]  ONG:[id number] DOB: 1933/08/11 DOA: 11/25/2016 PCP: Aura Dials, MD   Chief Complaint  Patient presents with  . Altered Mental Status  . Hematuria    Brief Narrative:  HPI On 11/25/2016 by Dr. Myrene Buddy [name] [name] is a 83 y.o. female with a past medical history significant for IDDM, hypothyroidism and HTN who presents with 2 days abdominal pain, malaise and now confusion. All history is collected from the patient's daughter, as the patient is obtunded. Per daughter on Monday patient saw her primary doctor who decreased her insulin because of night time lows or highs (she is not sure). Then Tuesday evening on the phone, the patient seemed a little bit "agitated" or "tired" and later that night she called her daughter complaining of some abdominal discomfort, "upset stomach", and asked to be taken to the emergency room.  In the ER, she was quite hyperglycemic but bicarb and gap normal, no leukocytosis, normal renal function.  Lipase elevated but CT unremarkable except moderate ascites.  It seems she felt better and was prepared for discharge, only out of the ordinary finding that she was given a foley bag for retained urine. Per daughter, she slep this morning, and then in the afternoon got up and was altered, totally confused, pulled out her foley and had some gross hematuria (at baseline she lives alone, drives, does her finances online, is totally independent, manages her own meds).  Daughter is aware of no fever, cough, sputum production, dyrsuria, urinary irritation, recent diarrhea (although she has episodes from time to time).  Interim history Patient admitted with sepsis and DKA. Was also found to have acute urinary retention, Foley catheter was placed, failed voiding trial. Patient was also noted to have ileus with bowel traction. General surgery consulted. S/p exploratory laparotomy with lysis of adhesions. NG tube removed. Started on  clears 12/08/16 by surgery.  Assessment & Plan   DKA/diabetes mellitus, type II -It was thought that this was a due to patient decreasing her insulin over the past week prior to admission, vs infection -patient was on metformin at home -Resolved, patient did require glucose stabilizer however was transitioned to levemir and ISS  Sepsis secondary to urinary tract infection -Patient didn't have leukocytosis, low-grade temperature, elevated lactic acid, on admission. -Lactic acidosis was thought to be exacerbated by metformin -Patient was started on vancomycin and Zosyn, currently on ceftriaxone- has received 7 days of antibiotic coverage -Antibiotics discontinued on 5/20 -urine culture showed multiple species, subsequent urine culture showed no growth -Blood culture showed no growth to date  Urinary retention -Required Foley catheter placement, patient did fail a voiding trial -Foley catheter removed, patient able to urinate on her own.  Acute kidney injury with hyperkalemia -Creatinine was 1.57 on admission, currently 0.69 -Resolved with IV fluids -Hyperkalemia resolved  Hypokalemia -Resolved with supplementation.  -Continue to monitor BMP  Metabolic encephalopathy -Appears to be at baseline per daughter at bedside  Hypothyroidism -Continue Synthroid  Dyslipidemia -Continue statin when tolerating PO   Abdominal pain/Small bowel obstruction -Upon admission, patient did have elevated lipase at 77 which digested pancreatitis however CT scan was normal -Suspected cause was DKA -CT abdomen and pelvis was done on 11/25/2016 showing moderate diffuse abdominal and pelvic ascites, possible gastric wall thickening which could be under distention. No evidence of bowel obstruction -CT abdomen and pelvis on 12/01/2016 showed small bowel obstruction with diffusely dilated fluid-filled small bowel throughout the abdomen and pelvis, 2 possible sites  of obstruction and the pelvis raising  possibility of closed loop obstruction. Moderate amount of ascites in the abdomen and pelvis -Patient does have NG tube in place -Continue IV fluids, antiemetics -Gen. surgery consulted and appreciated, pending further recommendations, may need laparotomy. -Abdominal Xray 5/17 showed persistent unchanged SBO. -S/p exploratory laparotomy with lysis of adhesions on 12/03/16 -NG tube removed today. Patient started on clear liquid diet by surgery  Elevated troponin -Possible demand ischemia from DKA, sepsis -Troponin up to 0.44 -Echocardiogram showed EF of 65-70%, no regional wall motion abnormalities, grade 1 diastolic dysfunction   Leukocytosis -Resolved, Suspect reactive to recent surgery -Currently afebrile wit no complaints of respiratory symptoms -Will continue to monitor CBC  Essential hypertension -continue IV hydralazine as needed  Severe malnutrition -in the context of chronic illness -nutrition consulted -currently NPO given recent SBO -Started on clear liquid diet today  DVT Prophylaxis  lovenox  Code Status: Full  Family Communication: Daughter at bedside  Disposition Plan: Admitted. NG tube removed. Suspect discharge to SNF in 24-48 hours   Consultants General surgery  Procedures  Echocardiogram exploratory laparotomy with lysis of adhesions  Antibiotics   Anti-infectives    Start     Dose/Rate Route Frequency Ordered Stop   12/01/16 1200  cefTRIAXone (ROCEPHIN) 1 g in dextrose 5 % 50 mL IVPB  Status:  Discontinued     1 g 100 mL/hr over 30 Minutes Intravenous Every 24 hours 12/01/16 1021 12/03/16 1732   11/30/16 2200  cephALEXin (KEFLEX) capsule 500 mg  Status:  Discontinued     500 mg Oral Every 12 hours 11/30/16 1141 12/01/16 1021   11/30/16 0000  cephALEXin (KEFLEX) 500 MG capsule     500 mg Oral Every 12 hours 11/30/16 1221 12/03/16 2359   11/26/16 1800  vancomycin (VANCOCIN) IVPB 750 mg/150 ml premix  Status:  Discontinued     750 mg 150 mL/hr over  60 Minutes Intravenous Every 24 hours 11/25/16 1826 11/26/16 0819   11/26/16 1000  cefTRIAXone (ROCEPHIN) 1 g in dextrose 5 % 50 mL IVPB  Status:  Discontinued     1 g 100 mL/hr over 30 Minutes Intravenous Every 24 hours 11/26/16 0819 11/30/16 1141   11/26/16 0000  piperacillin-tazobactam (ZOSYN) IVPB 3.375 g  Status:  Discontinued     3.375 g 12.5 mL/hr over 240 Minutes Intravenous Every 8 hours 11/25/16 1826 11/26/16 0819   11/25/16 1815  vancomycin (VANCOCIN) IVPB 1000 mg/200 mL premix     1,000 mg 200 mL/hr over 60 Minutes Intravenous  Once 11/25/16 1807 11/25/16 1926   11/25/16 1815  piperacillin-tazobactam (ZOSYN) IVPB 3.375 g     3.375 g 100 mL/hr over 30 Minutes Intravenous  Once 11/25/16 1807 11/25/16 1919      Subjective:   [name] [name] seen and examined today. Patient feeling better today. Happy the NG tube has been removed. Does have some mild abdominal soreness. Denies any chest pain, shortness of breath, dizziness or headache. Endorses multiple bowel movements overnight.   Objective:   Vitals:   12/07/16 0445 12/07/16 1328 12/07/16 1753 12/07/16 2127  BP: (!) 147/58 140/61 (!) 169/70 (!) 158/62  Pulse: 74 80 72 78  Resp: 20 18 18 18   Temp: 98.8 F (37.1 C) 97.6 F (36.4 C) 98.9 F (37.2 C) 98.9 F (37.2 C)  TempSrc: Oral Oral Oral Oral  SpO2: 97% 100% 100% 97%  Weight:      Height:        Intake/Output Summary (Last  24 hours) at 12/08/16 1046 Last data filed at 12/08/16 0042  Gross per 24 hour  Intake             2270 ml  Output              225 ml  Net             2045 ml   Filed Weights   11/25/16 1757 11/26/16 0017 11/29/16 1107  Weight: 63.5 kg (140 lb) 61.1 kg (134 lb 11.2 oz) 62.1 kg (137 lb)   Exam  General: Well developed, well nourished, NAD, appears stated age  HEENT: NCAT, mucous membranes moist.   Cardiovascular: S1 S2 auscultated, no rubs, murmurs or gallops. Regular rate and rhythm.  Respiratory: Clear to auscultation bilaterally  with equal chest rise  Abdomen: Soft, mildly TTP, nondistended, + bowel sounds-few, honeycomb dressing in place  Extremities: warm dry without cyanosis clubbing or edema  Neuro: AAOx3, nonfocal  Psych: appropriate mood and affect, pleasant  Data Reviewed: I have personally reviewed following labs and imaging studies  CBC:  Recent Labs Lab 12/04/16 0355 12/05/16 0424 12/06/16 0359 12/07/16 0357 12/08/16 0339  WBC 20.8* 12.1* 11.1* 9.3 9.1  HGB 10.9* 9.5* 10.8* 10.5* 10.8*  HCT 31.6* 28.6* 31.5* 31.5* 32.1*  MCV 89.5 91.7 90.3 90.0 89.4  PLT 348 344 352 379 026   Basic Metabolic Panel:  Recent Labs Lab 12/04/16 0355 12/05/16 0424 12/06/16 0359 12/07/16 0357 12/08/16 0339  NA 132* 131* 130* 131* 131*  K 4.3 4.3 4.1 3.8 3.7  CL 97* 99* 96* 97* 99*  CO2 26 25 26 28 24   GLUCOSE 201* 269* 217* 258* 133*  BUN 9 9 6  <5* <5*  CREATININE 0.77 0.82 0.72 0.68 0.69  CALCIUM 8.4* 7.9* 8.4* 8.6* 8.3*   GFR: Estimated Creatinine Clearance: 54.5 mL/min (by C-G formula based on SCr of 0.69 mg/dL). Liver Function Tests: No results for input(s): AST, ALT, ALKPHOS, BILITOT, PROT, ALBUMIN in the last 168 hours. No results for input(s): LIPASE, AMYLASE in the last 168 hours. No results for input(s): AMMONIA in the last 168 hours. Coagulation Profile: No results for input(s): INR, PROTIME in the last 168 hours. Cardiac Enzymes: No results for input(s): CKTOTAL, CKMB, CKMBINDEX, TROPONINI in the last 168 hours. BNP (last 3 results) No results for input(s): PROBNP in the last 8760 hours. HbA1C: No results for input(s): HGBA1C in the last 72 hours. CBG:  Recent Labs Lab 12/07/16 1159 12/07/16 2027 12/07/16 2308 12/08/16 0410 12/08/16 0826  GLUCAP 161* 296* 319* 112* 102*   Lipid Profile: No results for input(s): CHOL, HDL, LDLCALC, TRIG, CHOLHDL, LDLDIRECT in the last 72 hours. Thyroid Function Tests: No results for input(s): TSH, T4TOTAL, FREET4, T3FREE, THYROIDAB in the  last 72 hours. Anemia Panel: No results for input(s): VITAMINB12, FOLATE, FERRITIN, TIBC, IRON, RETICCTPCT in the last 72 hours. Urine analysis:    Component Value Date/Time   COLORURINE RED (A) 11/25/2016 1507   APPEARANCEUR HAZY (A) 11/25/2016 1507   LABSPEC 1.024 11/25/2016 1507   PHURINE 5.0 11/25/2016 1507   GLUCOSEU >=500 (A) 11/25/2016 1507   HGBUR LARGE (A) 11/25/2016 1507   BILIRUBINUR NEGATIVE 11/25/2016 1507   BILIRUBINUR neg 05/06/2015 1421   KETONESUR 20 (A) 11/25/2016 1507   PROTEINUR 100 (A) 11/25/2016 1507   UROBILINOGEN 1.0 05/06/2015 1421   UROBILINOGEN 0.2 12/25/2014 1206   NITRITE NEGATIVE 11/25/2016 1507   LEUKOCYTESUR NEGATIVE 11/25/2016 1507   Sepsis Labs: @LABRCNTIP (procalcitonin:4,lacticidven:4)  ) Recent  Results (from the past 240 hour(s))  MRSA PCR Screening     Status: None   Collection Time: 12/03/16  1:40 PM  Result Value Ref Range Status   MRSA by PCR NEGATIVE NEGATIVE Final    Comment:        The GeneXpert MRSA Assay (FDA approved for NASAL specimens only), is one component of a comprehensive MRSA colonization surveillance program. It is not intended to diagnose MRSA infection nor to guide or monitor treatment for MRSA infections.       Radiology Studies: No results found.   Scheduled Meds: . enoxaparin (LOVENOX) injection  40 mg Subcutaneous Q24H  . feeding supplement  1 Container Oral TID BM  . insulin aspart  0-15 Units Subcutaneous Q4H  . insulin detemir  4 Units Subcutaneous BID  . mouth rinse  15 mL Mouth Rinse BID  . pantoprazole (PROTONIX) IV  40 mg Intravenous QHS   Continuous Infusions: . dextrose 5 % and 0.45 % NaCl with KCl 20 mEq/L 75 mL/hr at 12/08/16 0716     LOS: 13 days   Time Spent in minutes   30 minutes  Courney Garrod D.O. on 12/08/2016 at 10:46 AM  Between 7am to 7pm - Pager - [phone]  After 7pm go to [email or web address] - password [email or web address]  And look for the night coverage person covering for me  after hours  [facility] Office  [phone]

## 2016-12-08 NOTE — Progress Notes (Signed)
CM continues to follow along and assist with dc planning as needed. Plan continues to be for SNF placement at this time. Marney Doctor RN,BSN,NCM [phone]

## 2016-12-08 NOTE — Progress Notes (Signed)
   12/08/16 1000  Clinical Encounter Type  Visited With Patient and family together  Visit Type Follow-up;Psychological support;Spiritual support  Referral From Nurse  Consult/Referral To Chaplain  Spiritual Encounters  Spiritual Needs Other (Comment) (Advance Directive )  Stress Factors  Patient Stress Factors Other (Comment);Family relationships (Advance Directive )  Family Stress Factors Other (Comment);Family relationships (Forensic scientist )  Advance Directives (For Healthcare)  Does Patient Have a Medical Advance Directive? Yes  Would patient like information on creating a medical advance directive? Yes (MAU/Ambulatory/Procedural Areas - Information given)  Mental Health Advance Directives  Does Patient Have a Mental Health Advance Directive? No  Would patient like information on creating a mental health advance directive? No - Patient declined     12/08/16 1000  Clinical Encounter Type  Visited With Patient and family together  Visit Type Follow-up;Psychological support;Spiritual support  Referral From Nurse  Consult/Referral To Chaplain  Spiritual Encounters  Spiritual Needs Other (Comment) (Advance Directive )  Stress Factors  Patient Stress Factors Other (Comment);Family relationships (Advance Directive )  Family Stress Factors Other (Comment);Family relationships (Forensic scientist )  Advance Directives (For Healthcare)  Does Patient Have a Medical Advance Directive? Yes  Would patient like information on creating a medical advance directive? Yes (MAU/Ambulatory/Procedural Areas - Information given)  Mental Health Advance Directives  Does Patient Have a Mental Health Advance Directive? No  Would patient like information on creating a mental health advance directive? No - Patient declined   The patient requested to complete an Forensic scientist, [place], naming her two daughters as her Healthcare agents. There are complicated family dynamics,  which were unnamed, between the daughters and another sibling. This made them unable to make decisions together in the event that the patient is unable to. The patient stated to me that she trusts her two daughters to make the best decisions for her.  I did the education with the patient and her daughter. The patient understood everything and filled in the necessary portions.  Two witnesses were obtained and I notarized the document.  A paper copy has been placed in the front of the patient's medical chart.   Please, contact Spiritual Care for further assistance.   [place] M.Div.

## 2016-12-08 NOTE — Progress Notes (Signed)
5 Days Post-Op   Subjective/Chief Complaint: No complaints. No nausea with NG clamped yesterday, tolerated sips. Bowel movements recorded overnight.     Objective: Vital signs in last 24 hours: Temp:  [97.6 F (36.4 C)-98.9 F (37.2 C)] 98.9 F (37.2 C) (05/21 2127) Pulse Rate:  [72-80] 78 (05/21 2127) Resp:  [18] 18 (05/21 2127) BP: (140-169)/(61-70) 158/62 (05/21 2127) SpO2:  [97 %-100 %] 97 % (05/21 2127) Last BM Date: 12/08/16  Intake/Output from previous day: 05/21 0701 - 05/22 0700 In: 2270 [P.O.:300; I.V.:1970] Out: 225 [Urine:225] + 5 occurrences Intake/Output this shift: No intake/output data recorded.  General appearance: alert and cooperative Resp: clear to auscultation bilaterally Cardio: regular rate and rhythm GI: soft, minimal tenderness. few bs Incision c/d/i under honeycomb  Lab Results:   Recent Labs  12/07/16 0357 12/08/16 0339  WBC 9.3 9.1  HGB 10.5* 10.8*  HCT 31.5* 32.1*  PLT 379 371   BMET  Recent Labs  12/07/16 0357 12/08/16 0339  NA 131* 131*  K 3.8 3.7  CL 97* 99*  CO2 28 24  GLUCOSE 258* 133*  BUN <5* <5*  CREATININE 0.68 0.69  CALCIUM 8.6* 8.3*   PT/INR No results for input(s): LABPROT, INR in the last 72 hours. ABG No results for input(s): PHART, HCO3 in the last 72 hours.  Invalid input(s): PCO2, PO2  Studies/Results: No results found.  Anti-infectives: Anti-infectives    Start     Dose/Rate Route Frequency Ordered Stop   12/01/16 1200  cefTRIAXone (ROCEPHIN) 1 g in dextrose 5 % 50 mL IVPB  Status:  Discontinued     1 g 100 mL/hr over 30 Minutes Intravenous Every 24 hours 12/01/16 1021 12/03/16 1732   11/30/16 2200  cephALEXin (KEFLEX) capsule 500 mg  Status:  Discontinued     500 mg Oral Every 12 hours 11/30/16 1141 12/01/16 1021   11/30/16 0000  cephALEXin (KEFLEX) 500 MG capsule     500 mg Oral Every 12 hours 11/30/16 1221 12/03/16 2359   11/26/16 1800  vancomycin (VANCOCIN) IVPB 750 mg/150 ml premix  Status:   Discontinued     750 mg 150 mL/hr over 60 Minutes Intravenous Every 24 hours 11/25/16 1826 11/26/16 0819   11/26/16 1000  cefTRIAXone (ROCEPHIN) 1 g in dextrose 5 % 50 mL IVPB  Status:  Discontinued     1 g 100 mL/hr over 30 Minutes Intravenous Every 24 hours 11/26/16 0819 11/30/16 1141   11/26/16 0000  piperacillin-tazobactam (ZOSYN) IVPB 3.375 g  Status:  Discontinued     3.375 g 12.5 mL/hr over 240 Minutes Intravenous Every 8 hours 11/25/16 1826 11/26/16 0819   11/25/16 1815  vancomycin (VANCOCIN) IVPB 1000 mg/200 mL premix     1,000 mg 200 mL/hr over 60 Minutes Intravenous  Once 11/25/16 1807 11/25/16 1926   11/25/16 1815  piperacillin-tazobactam (ZOSYN) IVPB 3.375 g     3.375 g 100 mL/hr over 30 Minutes Intravenous  Once 11/25/16 1807 11/25/16 1919      Assessment/Plan: s/p Procedure(s): EXPLORATORY LAPAROTOMY, LYSIS OF ADHESIONS (N/A) Remove NG Clear liquids and boost/breeze PO pain meds Ambulate   LOS: 13 days    [name] [name] 12/08/2016

## 2016-12-08 NOTE — Progress Notes (Signed)
Physical Therapy Treatment Patient Details Name: [name] [name] MRN: [id number] DOB: Nov 30, 1933 Today's Date: 12/08/2016    History of Present Illness  83 y.o. female with a past medical history significant for IDDM, hypothyroidism and HTN who presents with 2 days abdominal pain, malaise and now confusion. s/p EXPLORATORY LAPAROTOMY, LYSIS OF ADHESIONS on 12/03/16.    PT Comments    The patient is making slow and steady progress in functional mobility. Should be able to progress with ambulation next visit. Recommend SNF due to weakness, posts surgery since admission.   Follow Up Recommendations  SNF;Supervision/Assistance - 24 hour     Equipment Recommendations  None recommended by PT    Recommendations for Other Services       Precautions / Restrictions Precautions Precaution Comments: h/o 1 fall in past 1 year, incontinence Restrictions Weight Bearing Restrictions: No    Mobility  Bed Mobility   Bed Mobility: Rolling;Sidelying to Sit Rolling: Min assist Sidelying to sit: Min assist;HOB elevated       General bed mobility comments: assist to raise trunk, only min assist  Transfers Overall transfer level: Needs assistance Equipment used: Rolling walker (2 wheeled) Transfers: Sit to/from [facility] Sit to Stand: Mod assist Stand pivot transfers: Mod assist       General transfer comment: assist to rise and steady . small steps to [facility].  Ambulation/Gait Ambulation/Gait assistance: Mod assist Ambulation Distance (Feet): 5 Feet Assistive device: Rolling walker (2 wheeled)       General Gait Details: encouraged to ambulate farther, pt. declined   Stairs            Wheelchair Mobility    Modified Rankin (Stroke Patients Only)       Balance Overall balance assessment: Needs assistance Sitting-balance support: Feet supported;No upper extremity supported Sitting balance-Leahy Scale: Fair     Standing balance support: During  functional activity;Bilateral upper extremity supported Standing balance-Leahy Scale: Poor                              Cognition Arousal/Alertness: Awake/alert Behavior During Therapy: Flat affect Overall Cognitive Status: Within Functional Limits for tasks assessed                                        Exercises General Exercises - Lower Extremity Ankle Circles/Pumps: AROM;Both;10 reps Heel Slides: AROM;Both;10 reps Other Exercises Other Exercises: hip rotations x 10 BLE's    General Comments        Pertinent Vitals/Pain Pain Score: 6  Pain Location: abdomen area Pain Descriptors / Indicators: Discomfort Pain Intervention(s): RN gave pain meds during session;Monitored during session    Home Living                      Prior Function            PT Goals (current goals can now be found in the care plan section) Progress towards PT goals: Progressing toward goals    Frequency    Min 3X/week      PT Plan Current plan remains appropriate    Co-evaluation              AM-PAC PT "6 Clicks" Daily Activity  Outcome Measure  Difficulty turning over in bed (including adjusting bedclothes, sheets and blankets)?: A Little Difficulty moving from lying  on back to sitting on the side of the bed? : A Little Difficulty sitting down on and standing up from a chair with arms (e.g., wheelchair, bedside commode, etc,.)?: A Lot Help needed moving to and from a bed to chair (including a wheelchair)?: A Lot Help needed walking in hospital room?: A Lot Help needed climbing 3-5 steps with a railing? : Total 6 Click Score: 13    End of Session Equipment Utilized During Treatment: Gait belt Activity Tolerance: Patient tolerated treatment well Patient left: in chair;with call bell/phone within reach;with family/visitor present Nurse Communication: Mobility status PT Visit Diagnosis: History of falling (Z91.81);Difficulty in walking, not  elsewhere classified (R26.2)     Time: 0937-1000 PT Time Calculation (min) (ACUTE ONLY): 23 min  Charges:  $Therapeutic Activity: 23-37 mins                    G CodesTresa Endo PT [phone]   Claretha Cooper 12/08/2016, 10:09 AM

## 2016-12-09 DIAGNOSIS — Z48815 Encounter for surgical aftercare following surgery on the digestive system: Secondary | ICD-10-CM | POA: Diagnosis not present

## 2016-12-09 DIAGNOSIS — R338 Other retention of urine: Secondary | ICD-10-CM | POA: Diagnosis not present

## 2016-12-09 DIAGNOSIS — R1033 Periumbilical pain: Secondary | ICD-10-CM | POA: Diagnosis not present

## 2016-12-09 DIAGNOSIS — R404 Transient alteration of awareness: Secondary | ICD-10-CM | POA: Diagnosis not present

## 2016-12-09 DIAGNOSIS — K565 Intestinal adhesions [bands], unspecified as to partial versus complete obstruction: Secondary | ICD-10-CM | POA: Diagnosis not present

## 2016-12-09 DIAGNOSIS — R9431 Abnormal electrocardiogram [ECG] [EKG]: Secondary | ICD-10-CM | POA: Diagnosis not present

## 2016-12-09 DIAGNOSIS — N179 Acute kidney failure, unspecified: Secondary | ICD-10-CM | POA: Diagnosis not present

## 2016-12-09 DIAGNOSIS — Z7982 Long term (current) use of aspirin: Secondary | ICD-10-CM | POA: Diagnosis not present

## 2016-12-09 DIAGNOSIS — E11649 Type 2 diabetes mellitus with hypoglycemia without coma: Secondary | ICD-10-CM | POA: Diagnosis not present

## 2016-12-09 DIAGNOSIS — E039 Hypothyroidism, unspecified: Secondary | ICD-10-CM | POA: Diagnosis not present

## 2016-12-09 DIAGNOSIS — A419 Sepsis, unspecified organism: Secondary | ICD-10-CM | POA: Diagnosis not present

## 2016-12-09 DIAGNOSIS — K56609 Unspecified intestinal obstruction, unspecified as to partial versus complete obstruction: Secondary | ICD-10-CM | POA: Diagnosis not present

## 2016-12-09 DIAGNOSIS — R0902 Hypoxemia: Secondary | ICD-10-CM | POA: Diagnosis not present

## 2016-12-09 DIAGNOSIS — E162 Hypoglycemia, unspecified: Secondary | ICD-10-CM | POA: Diagnosis not present

## 2016-12-09 DIAGNOSIS — R5383 Other fatigue: Secondary | ICD-10-CM | POA: Diagnosis not present

## 2016-12-09 DIAGNOSIS — E119 Type 2 diabetes mellitus without complications: Secondary | ICD-10-CM | POA: Diagnosis not present

## 2016-12-09 DIAGNOSIS — K5649 Other impaction of intestine: Secondary | ICD-10-CM | POA: Diagnosis not present

## 2016-12-09 DIAGNOSIS — E1142 Type 2 diabetes mellitus with diabetic polyneuropathy: Secondary | ICD-10-CM | POA: Diagnosis not present

## 2016-12-09 DIAGNOSIS — Z5181 Encounter for therapeutic drug level monitoring: Secondary | ICD-10-CM | POA: Diagnosis not present

## 2016-12-09 DIAGNOSIS — I10 Essential (primary) hypertension: Secondary | ICD-10-CM | POA: Diagnosis not present

## 2016-12-09 DIAGNOSIS — R4182 Altered mental status, unspecified: Secondary | ICD-10-CM | POA: Diagnosis not present

## 2016-12-09 DIAGNOSIS — R278 Other lack of coordination: Secondary | ICD-10-CM | POA: Diagnosis not present

## 2016-12-09 DIAGNOSIS — N189 Chronic kidney disease, unspecified: Secondary | ICD-10-CM | POA: Diagnosis not present

## 2016-12-09 DIAGNOSIS — Z79899 Other long term (current) drug therapy: Secondary | ICD-10-CM | POA: Diagnosis not present

## 2016-12-09 DIAGNOSIS — Z794 Long term (current) use of insulin: Secondary | ICD-10-CM | POA: Diagnosis not present

## 2016-12-09 DIAGNOSIS — N182 Chronic kidney disease, stage 2 (mild): Secondary | ICD-10-CM | POA: Diagnosis not present

## 2016-12-09 DIAGNOSIS — H9193 Unspecified hearing loss, bilateral: Secondary | ICD-10-CM | POA: Diagnosis not present

## 2016-12-09 DIAGNOSIS — R2681 Unsteadiness on feet: Secondary | ICD-10-CM | POA: Diagnosis not present

## 2016-12-09 DIAGNOSIS — E161 Other hypoglycemia: Secondary | ICD-10-CM | POA: Diagnosis not present

## 2016-12-09 DIAGNOSIS — M6281 Muscle weakness (generalized): Secondary | ICD-10-CM | POA: Diagnosis not present

## 2016-12-09 DIAGNOSIS — R531 Weakness: Secondary | ICD-10-CM | POA: Diagnosis not present

## 2016-12-09 DIAGNOSIS — L7682 Other postprocedural complications of skin and subcutaneous tissue: Secondary | ICD-10-CM | POA: Diagnosis not present

## 2016-12-09 DIAGNOSIS — E1122 Type 2 diabetes mellitus with diabetic chronic kidney disease: Secondary | ICD-10-CM | POA: Diagnosis not present

## 2016-12-09 DIAGNOSIS — I129 Hypertensive chronic kidney disease with stage 1 through stage 4 chronic kidney disease, or unspecified chronic kidney disease: Secondary | ICD-10-CM | POA: Diagnosis not present

## 2016-12-09 DIAGNOSIS — E1165 Type 2 diabetes mellitus with hyperglycemia: Secondary | ICD-10-CM | POA: Diagnosis not present

## 2016-12-09 DIAGNOSIS — L8990 Pressure ulcer of unspecified site, unspecified stage: Secondary | ICD-10-CM | POA: Insufficient documentation

## 2016-12-09 LAB — GLUCOSE, CAPILLARY
GLUCOSE-CAPILLARY: 118 mg/dL — AB (ref 65–99)
GLUCOSE-CAPILLARY: 222 mg/dL — AB (ref 65–99)
Glucose-Capillary: 145 mg/dL — ABNORMAL HIGH (ref 65–99)

## 2016-12-09 MED ORDER — TRAMADOL HCL 50 MG PO TABS: 50.0000 mg | ORAL_TABLET | Freq: Four times a day (QID) | ORAL | 0 refills | Status: DC | PRN

## 2016-12-09 MED ORDER — ENSURE ENLIVE PO LIQD: 237.0000 mL | Freq: Two times a day (BID) | ORAL | 12 refills | Status: DC

## 2016-12-09 MED ORDER — MORPHINE SULFATE (PF) 10 MG/ML IV SOLN
1.0000 mg | INTRAVENOUS | Status: DC | PRN
Start: 2016-12-09 — End: 2016-12-09
  Administered 2016-12-09 (×2): 2 mg via INTRAVENOUS
  Filled 2016-12-09 (×2): qty 1

## 2016-12-09 MED ORDER — ENSURE ENLIVE PO LIQD
237.0000 mL | Freq: Two times a day (BID) | ORAL | Status: DC
  Administered 2016-12-09 (×2): 237 mL via ORAL

## 2016-12-09 NOTE — Progress Notes (Signed)
Attempted to call report to [facility] place. Message left with nursing supervisor with number to call back for report.

## 2016-12-09 NOTE — Progress Notes (Signed)
Report called to Jinny Blossom, Therapist, sports at [facility]. Pt discharged via [facility] with daughters at bedside.

## 2016-12-09 NOTE — Progress Notes (Signed)
6 Days Post-Op   Subjective/Chief Complaint: No complaints. Tolerated clear liquids, multiple bowel movements recorded overnight.  Worked with PT and got to chair yesterday.   Objective: Vital signs in last 24 hours: Temp:  [98.4 F (36.9 C)-99.3 F (37.4 C)] 98.4 F (36.9 C) (05/23 0421) Pulse Rate:  [68-83] 68 (05/23 0421) Resp:  [16-18] 16 (05/23 0421) BP: (145-153)/(59-63) 145/63 (05/23 0421) SpO2:  [97 %-99 %] 99 % (05/23 0421) Last BM Date: 12/08/16 (pt had a couple)  Intake/Output from previous day: 05/22 0701 - 05/23 0700 In: 1778 [I.V.:1778] Out: 1025 [Urine:1025] + 3 occurrences Intake/Output this shift: Total I/O In: -  Out: 325 [Urine:325]  General appearance: alert and cooperative Resp: clear to auscultation bilaterally Cardio: regular rate and rhythm GI: soft, minimal tenderness. few bs Incision c/d/i under honeycomb  Lab Results:   Recent Labs  12/07/16 0357 12/08/16 0339  WBC 9.3 9.1  HGB 10.5* 10.8*  HCT 31.5* 32.1*  PLT 379 371   BMET  Recent Labs  12/07/16 0357 12/08/16 0339  NA 131* 131*  K 3.8 3.7  CL 97* 99*  CO2 28 24  GLUCOSE 258* 133*  BUN <5* <5*  CREATININE 0.68 0.69  CALCIUM 8.6* 8.3*   PT/INR No results for input(s): LABPROT, INR in the last 72 hours. ABG No results for input(s): PHART, HCO3 in the last 72 hours.  Invalid input(s): PCO2, PO2  Studies/Results: No results found.  Anti-infectives: Anti-infectives    Start     Dose/Rate Route Frequency Ordered Stop   12/01/16 1200  cefTRIAXone (ROCEPHIN) 1 g in dextrose 5 % 50 mL IVPB  Status:  Discontinued     1 g 100 mL/hr over 30 Minutes Intravenous Every 24 hours 12/01/16 1021 12/03/16 1732   11/30/16 2200  cephALEXin (KEFLEX) capsule 500 mg  Status:  Discontinued     500 mg Oral Every 12 hours 11/30/16 1141 12/01/16 1021   11/30/16 0000  cephALEXin (KEFLEX) 500 MG capsule     500 mg Oral Every 12 hours 11/30/16 1221 12/03/16 2359   11/26/16 1800  vancomycin  (VANCOCIN) IVPB 750 mg/150 ml premix  Status:  Discontinued     750 mg 150 mL/hr over 60 Minutes Intravenous Every 24 hours 11/25/16 1826 11/26/16 0819   11/26/16 1000  cefTRIAXone (ROCEPHIN) 1 g in dextrose 5 % 50 mL IVPB  Status:  Discontinued     1 g 100 mL/hr over 30 Minutes Intravenous Every 24 hours 11/26/16 0819 11/30/16 1141   11/26/16 0000  piperacillin-tazobactam (ZOSYN) IVPB 3.375 g  Status:  Discontinued     3.375 g 12.5 mL/hr over 240 Minutes Intravenous Every 8 hours 11/25/16 1826 11/26/16 0819   11/25/16 1815  vancomycin (VANCOCIN) IVPB 1000 mg/200 mL premix     1,000 mg 200 mL/hr over 60 Minutes Intravenous  Once 11/25/16 1807 11/25/16 1926   11/25/16 1815  piperacillin-tazobactam (ZOSYN) IVPB 3.375 g     3.375 g 100 mL/hr over 30 Minutes Intravenous  Once 11/25/16 1807 11/25/16 1919      Assessment/Plan: s/p Procedure(s): EXPLORATORY LAPAROTOMY, LYSIS OF ADHESIONS (N/A) Advance to soft diet Stop IVF PO pain meds Ambulate/PT- anticipate SNF on discharge  From surgical standpoint if she tolerates the soft diet today OK to discharge to SNF. Dressing can be removed at discharge. Will need follow up with [facility] in 1-2 weeks for staple removal.   LOS: 14 days    Clovis Riley 12/09/2016

## 2016-12-09 NOTE — Discharge Summary (Addendum)
Physician Discharge Summary  [name] [name]:[id number] DOB: Aug 21, 1933 DOA: 11/25/2016  PCP: Aura Dials, MD  Admit date: 11/25/2016 Discharge date: 12/09/2016  Recommendations for Outpatient Follow-up:  1. Continue medications as per prior home regimen. Follow-up with primary care in 1-2 weeks to make sure symptoms are stable. 2. Follow-up with surgery per scheduled appointment  Discharge Diagnoses:  Principal Problem:   SBO (small bowel obstruction) s/p lysis of adhesions 12/03/2016 Active Problems:   Hypothyroidism, acquired   Type 2 diabetes mellitus without complication, with long-term current use of insulin (HCC)   Essential hypertension   GERD   Constipation   Hyperlipidemia   DKA (diabetic ketoacidoses) (HCC)   Hyperkalemia   Sepsis ([place])   AKI (acute kidney injury) ([place])   Delirium   Acute urinary retention   Ascites   Abdominal distension   Facial droop   Pressure injury of skin    Discharge Condition: stable   Diet recommendation: as tolerated   History of present illness:   83 year old female with past medical history significant for insulin-dependent diabetes mellitus, hypertension, hypothyroidism who presented to ED with abdominal pain, fatigue, confusion. Patient has seen primary care prior to the admission who decreased insulin because of the nighttime low CBGs. The following day patient developed more abdominal discomfort and was taken to ER. In ED, patient was hyperglycemic but bicarbonate and anion gap was normal. Normal renal function. Lipase was elevated but CT scan was unremarkable. She felt better and was prepared for discharge however out of ordinary finding her Foley bag had retained urine. The following morning patient was more confused, pulled out the Foley with development of gross hematuria after that. She also developed ileus and small bowel obstruction. Surgery consulted. She is status post exploratory laparotomy with lysis of adhesions, NG  tube removed and started on regular diet.  Hospital Course:  Principal Problem:   SBO (small bowel obstruction) s/p lysis of adhesions 12/03/2016 - Feels much better, tolerated clears and she will have soft food for lunch  - Okay for discharge per surgery recommendations  - May continue tramadol on discharge for pain control   Active Problems:   Type 2 diabetes mellitus without complication, with long-term current use of insulin ([place]) / DKA - Continue home medications     Sepsis secondary to urinary tract infection  - Antibiotic discontinued May 20  - Blood cultures showed no growth       Urinary retention - Resolved    Acute kidney injury with hyperkalemia - Creatinine within normal limits, potassium within normal limits    Pressure skin injury, left buttock, stage 2 - Care per RN protocol     DVT prophylaxis: Lovenox subcutaneous   Family communication: Daughter at the bedside    CODE STATUS: Full code   Signed:  Leisa Lenz, MD  [facility] 12/09/2016, 11:30 AM  Pager #: [phone]  Time spent in minutes: less than 30 minutes  Discharge Exam: Vitals:   12/08/16 2030 12/09/16 0421  BP: (!) 153/63 (!) 145/63  Pulse: 68 68  Resp: 16 16  Temp: 99 F (37.2 C) 98.4 F (36.9 C)   Vitals:   12/07/16 2127 12/08/16 1500 12/08/16 2030 12/09/16 0421  BP: (!) 158/62 (!) 147/59 (!) 153/63 (!) 145/63  Pulse: 78 83 68 68  Resp: 18 18 16 16   Temp: 98.9 F (37.2 C) 99.3 F (37.4 C) 99 F (37.2 C) 98.4 F (36.9 C)  TempSrc: Oral Oral Oral Oral  SpO2: 97%  98% 97% 99%  Weight:      Height:        General: Pt is alert, not in acute distress Cardiovascular: Regular rate and rhythm, S1/S2 + Respiratory: Clear to auscultation bilaterally, no wheezing, no crackles, no rhonchi Abdominal: Soft, non tender, non distended, bowel sounds +, no guarding Extremities: no edema, no cyanosis, pulses palpable bilaterally DP and PT Neuro: Grossly nonfocal  Discharge  Instructions  Discharge Instructions    Call MD for:  persistant nausea and vomiting    Complete by:  As directed    Call MD for:  redness, tenderness, or signs of infection (pain, swelling, redness, odor or green/yellow discharge around incision site)    Complete by:  As directed    Call MD for:  severe uncontrolled pain    Complete by:  As directed    Diet - low sodium heart healthy    Complete by:  As directed    Diet - low sodium heart healthy    Complete by:  As directed    Discharge instructions    Complete by:  As directed    Please follow up with primary care in 7 days.   Increase activity slowly    Complete by:  As directed    Increase activity slowly    Complete by:  As directed      Allergies as of 12/09/2016      Reactions   Lisinopril Cough   Reported by [facility]   Sulfa Antibiotics Hives   Sulfonamide Derivatives Hives   Bactrim [sulfamethoxazole-trimethoprim] Rash   Reported by [facility]      Medication List    STOP taking these medications   furosemide 20 MG tablet Commonly known as:  LASIX     TAKE these medications   acetaminophen 500 MG tablet Commonly known as:  TYLENOL Take 1 tablet (500 mg total) by mouth every 6 (six) hours as needed (pain). What changed:  how much to take   alendronate 70 MG tablet Commonly known as:  FOSAMAX Take 70 mg by mouth every 7 (seven) days.   aspirin EC 81 MG tablet Take 81 mg by mouth daily.   CALCIUM-D PO Take 2 tablets by mouth 2 (two) times daily.   cholecalciferol 1000 units tablet Commonly known as:  VITAMIN D Take 1,000 Units by mouth daily.   clindamycin 2 % vaginal cream Commonly known as:  CLEOCIN Place 1 Applicatorful vaginally at bedtime.   cyclobenzaprine 5 MG tablet Commonly known as:  FLEXERIL Take 5 mg by mouth 3 (three) times daily as needed for muscle spasms.   feeding supplement (ENSURE ENLIVE) Liqd Take 237 mLs by mouth 2 (two) times daily between meals.   FISH  OIL PO Take 1 capsule by mouth 2 (two) times daily.   insulin aspart 100 UNIT/ML injection Commonly known as:  novoLOG Inject 7-8 Units into the skin See admin instructions. Inject 7 units subcutaneously at breakfast and lunch and 8 units at supper What changed:  Another medication with the same name was removed. Continue taking this medication, and follow the directions you see here.   Insulin Detemir 100 UNIT/ML Pen Commonly known as:  LEVEMIR FLEXTOUCH Inject 8 Units into the skin 2 (two) times daily. What changed:  Another medication with the same name was removed. Continue taking this medication, and follow the directions you see here.   iron polysaccharides 150 MG capsule Commonly known as:  NIFEREX Take 150 mg by mouth 2 (  two) times daily.   levothyroxine 100 MCG tablet Commonly known as:  SYNTHROID, LEVOTHROID Take 1 tablet (100 mcg total) by mouth daily. What changed:  Another medication with the same name was removed. Continue taking this medication, and follow the directions you see here.   loratadine 10 MG tablet Commonly known as:  CLARITIN Take 10 mg by mouth every other day.   losartan 25 MG tablet Commonly known as:  COZAAR Take 1 tablet (25 mg total) by mouth daily. What changed:  Another medication with the same name was removed. Continue taking this medication, and follow the directions you see here.   meclizine 12.5 MG tablet Commonly known as:  ANTIVERT Take 1 tablet (12.5 mg total) by mouth 3 (three) times daily as needed for dizziness.   metFORMIN 1000 MG tablet Commonly known as:  GLUCOPHAGE Take 1,000 mg by mouth 2 (two) times daily with a meal.   mometasone 50 MCG/ACT nasal spray Commonly known as:  NASONEX Place 2 sprays into the nose 2 (two) times daily as needed (seasonal allergies).   naproxen sodium 220 MG tablet Commonly known as:  ALEVE Take 2 tablets (440 mg total) by mouth 2 (two) times daily as needed (pain).   NONFORMULARY OR  COMPOUNDED ITEM Estradiol 0.02 % 26ml prefilled applicator Sig: apply twice a week   nystatin 100000 UNIT/ML suspension Commonly known as:  MYCOSTATIN Take 5 mLs by mouth 2 (two) times daily. For mouth   ondansetron 4 MG disintegrating tablet Commonly known as:  ZOFRAN ODT Take 1 tablet (4 mg total) by mouth every 8 (eight) hours as needed for nausea or vomiting.   pantoprazole 40 MG tablet Commonly known as:  PROTONIX Take 1 tablet (40 mg total) by mouth daily. What changed:  Another medication with the same name was removed. Continue taking this medication, and follow the directions you see here.   polyethylene glycol packet Commonly known as:  MIRALAX / GLYCOLAX Take 17 g by mouth every Monday, Wednesday, and Friday. Mix in 8 oz liquid and drink   simvastatin 40 MG tablet Commonly known as:  ZOCOR TAKE 1 TABLET BY MOUTH EVERY EVENING. NEED OFFICE VISIT FOR MORE REFILLS What changed:  Another medication with the same name was removed. Continue taking this medication, and follow the directions you see here.     ASK your doctor about these medications   cephALEXin 500 MG capsule Commonly known as:  KEFLEX Take 1 capsule (500 mg total) by mouth every 12 (twelve) hours. Ask about: Should I take this medication?        Contact information for follow-up providers    Aura Dials, MD Follow up in 1 week(s).   Specialty:  Family Medicine Contact information: [address] [address] [phone]            Contact information for after-discharge care    Destination    [facility] Follow up.   Specialty:  [place] information: [address] [place] [phone]                   The results of significant diagnostics from this hospitalization (including imaging, microbiology, ancillary and laboratory) are listed below for reference.    Significant Diagnostic  Studies: Dg Abd 1 View  Result Date: 12/01/2016 CLINICAL DATA:  83 year old female with nasogastric tube placement. Ascites. Subsequent encounter. EXAM: ABDOMEN - 1 VIEW COMPARISON:  12/01/2016 CT. FINDINGS: Nasogastric  tube with tip and side hole gastric fundus level. Small bowel obstructive pattern with gas distended small bowel loops with thickened folds as noted on recent CT. IMPRESSION: Nasogastric tube with tip and side hole gastric fundus level. Small bowel obstructive pattern. Electronically Signed   By: Genia Del M.D.   On: 12/01/2016 17:15   Ct Head Wo Contrast  Result Date: 11/27/2016 CLINICAL DATA:  New facial droop. EXAM: CT HEAD WITHOUT CONTRAST TECHNIQUE: Contiguous axial images were obtained from the base of the skull through the vertex without intravenous contrast. COMPARISON:  Head CT 11/25/16 FINDINGS: Brain: No mass lesion, intraparenchymal hemorrhage or extra-axial collection. No evidence of acute cortical infarct. There is unchanged ventriculomegaly. There is periventricular hypoattenuation compatible with chronic microvascular disease. Vascular: No hyperdense vessel or unexpected calcification. Skull: Normal visualized skull base, calvarium and extracranial soft tissues. Sinuses/Orbits: No sinus fluid levels or advanced mucosal thickening. No mastoid effusion. Normal orbits. IMPRESSION: 1. No acute intracranial abnormality. 2. Chronic ventriculomegaly and microvascular ischemia. Electronically Signed   By: Ulyses Jarred M.D.   On: 11/27/2016 18:29   Ct Head Wo Contrast  Result Date: 11/25/2016 CLINICAL DATA:  Altered mental status. EXAM: CT HEAD WITHOUT CONTRAST TECHNIQUE: Contiguous axial images were obtained from the base of the skull through the vertex without intravenous contrast. COMPARISON:  Head CT 11/03/2013 FINDINGS: Brain: No evidence of acute infarction, hemorrhage, hydrocephalus, extra-axial collection or mass lesion/mass effect. Stable degree of atrophy and chronic  last. Vascular: Atherosclerosis of skullbase vasculature without hyperdense vessel or abnormal calcification. Skull: No fracture or focal lesion. Sinuses/Orbits: Minimal chronic opacification of lower left mastoid air cells. Right mastoid air cells are clear. Visualized orbits and paranasal sinuses are unremarkable. Other: None. IMPRESSION: Unchanged atrophy and chronic small vessel ischemia. No acute intracranial abnormality. Electronically Signed   By: Jeb Levering M.D.   On: 11/25/2016 19:34   Ct Abdomen Pelvis W Contrast  Result Date: 12/01/2016 CLINICAL DATA:  confusion, malaise and abdominal pain. Sepsis, abdominal pain. EXAM: CT ABDOMEN AND PELVIS WITH CONTRAST TECHNIQUE: Multidetector CT imaging of the abdomen and pelvis was performed using the standard protocol following bolus administration of intravenous contrast. CONTRAST:  124mL ISOVUE-300 IOPAMIDOL (ISOVUE-300) INJECTION 61%, 67mL ISOVUE-300 IOPAMIDOL (ISOVUE-300) INJECTION 61% COMPARISON:  CT abdomen dated 11/25/2016. FINDINGS: Lower chest: Bilateral pleural effusions, at least moderate in size, incompletely imaged, with adjacent compressive atelectasis. Hepatobiliary: No focal liver abnormality is seen. No gallstones, gallbladder wall thickening, or biliary dilatation. Pancreas: Unremarkable. No pancreatic ductal dilatation or surrounding inflammatory changes. Spleen: Normal in size without focal abnormality. Adrenals/Urinary Tract: Adrenal glands appear normal. Left renal cyst. Kidneys otherwise unremarkable without stone or hydronephrosis. No ureteral or bladder calculi identified. Bladder is decompressed by Foley catheter. Stomach/Bowel: Diffusely dilated fluid-filled small bowel throughout the abdomen and pelvis. Two possible transition zones identified, left and right hemipelvis, with decompression of the distal small bowel (series 2, images 69 and 70 ; series 2, images 63 through 65). Colon is diffusely decompressed indicating complete  versus high-grade partial small bowel obstruction. Vascular/Lymphatic: Aortic atherosclerosis. No enlarged abdominal or pelvic lymph nodes. Reproductive: Status post hysterectomy. Other: Moderate amount of free fluid in the abdomen and pelvis. No circumscribed abscess collection identified. No free intraperitoneal air seen. Musculoskeletal: Degenerative changes throughout the thoracolumbar spine, mild to moderate in degree. No acute or suspicious osseous finding. Diffuse ill-defined fluid/edema within the subcutaneous soft tissues of the abdomen and pelvis, consistent with anasarca. IMPRESSION: 1. Small-bowel obstruction, with diffusely dilated fluid-filled small bowel  throughout the abdomen and pelvis, with decompressed distal small bowel and colon, consistent with complete small bowel obstruction versus high-grade partial obstruction. Two possible sites of obstruction within the pelvis raising the possibility of closed loop obstruction. 2. Moderate amount of ascites within the abdomen and pelvis. No abscess collection seen. 3. Bilateral pleural effusions, at least moderate in size, incompletely imaged. 4. Anasarca. 5. Aortic atherosclerosis. These results were called by telephone at the time of interpretation on 12/01/2016 at 3:32 pm to Dr. Sander Radon , who verbally acknowledged these results. Electronically Signed   By: Franki Cabot M.D.   On: 12/01/2016 15:36   Ct Abdomen Pelvis W Contrast  Result Date: 11/25/2016 CLINICAL DATA:  Abdominal pain, cramping, nausea, and vomiting for several hours. Elevated blood sugar. Elevated lipase. History of hypertension and diabetes. EXAM: CT ABDOMEN AND PELVIS WITH CONTRAST TECHNIQUE: Multidetector CT imaging of the abdomen and pelvis was performed using the standard protocol following bolus administration of intravenous contrast. CONTRAST:  100 mL Isovue-300 COMPARISON:  12/04/2005 FINDINGS: Lower chest: Mild dependent changes in the lung bases. Distal esophageal  wall thickening may indicate reflux disease. Hepatobiliary: There is a calcification in the porta hepatis which could represent a cystic duct stone. However, there is no evidence of gallbladder distention or inflammatory change. No bile duct dilatation. No focal liver lesions. Pancreas: Unremarkable. No pancreatic ductal dilatation or surrounding inflammatory changes. Spleen: Normal in size without focal abnormality. Adrenals/Urinary Tract: No adrenal gland nodules. Small cyst on the left kidney. No hydronephrosis or hydroureter. Renal nephrograms are symmetrical. Bladder is unremarkable. Stomach/Bowel: The stomach is decompressed. Gastric wall appears somewhat thickened but this may just be due to underdistention. Gastritis is not excluded. Small bowel are decompressed. Diffusely stool-filled colon without abnormal distention or wall thickening. Appendix is normal. Vascular/Lymphatic: Aortic atherosclerosis. No enlarged abdominal or pelvic lymph nodes. Reproductive: Status post hysterectomy. No adnexal masses. Other: Diffuse free fluid throughout the abdomen and pelvis suggesting ascites. No free air. Abdominal wall musculature appears intact. Musculoskeletal: Degenerative changes in the spine. No destructive bone lesions. IMPRESSION: Moderate diffuse abdominal and pelvic ascites. Stomach is decompressed. Possible gastric wall thickening although this could be due to under distention. Can't exclude gastritis. No evidence of bowel obstruction. Electronically Signed   By: Lucienne Capers M.D.   On: 11/25/2016 03:48   US Abdomen Limited  Result Date: 11/27/2016 CLINICAL DATA:  Ascites EXAM: LIMITED ABDOMINAL ULTRASOUND TO ASSESS FOR ASCITES COMPARISON:  CT abdomen and pelvis Nov 25, 2016 FINDINGS: There is mild ascites throughout the abdomen. There are multiple loops of peristalsing bowel. IMPRESSION: Mild ascites. Electronically Signed   By: Lowella Grip III M.D.   On: 11/27/2016 11:10   Dg Chest Port 1  View  Result Date: 11/25/2016 CLINICAL DATA:  Acute onset of altered mental status. Urinary retention. Initial encounter. EXAM: PORTABLE CHEST 1 VIEW COMPARISON:  Chest radiograph performed 02/12/2016 FINDINGS: The lungs are well-aerated and clear. There is no evidence of focal opacification, pleural effusion or pneumothorax. The cardiomediastinal silhouette is within normal limits. No acute osseous abnormalities are seen. Calcification overlying the right humeral head likely reflects calcific tendinitis. IMPRESSION: 1. No acute cardiopulmonary process seen. 2. Calcific tendinitis at the right rotator cuff. Electronically Signed   By: Garald Balding M.D.   On: 11/25/2016 18:42   Dg Abd 2 Views  Result Date: 12/03/2016 CLINICAL DATA:  Distended abd EXAM: ABDOMEN - 2 VIEW COMPARISON:  12/02/2016 FINDINGS: Nasogastric tube is in place, tip overlying the level  of the stomach. Central dilated small bowel loops are present, unchanged compared prior study. These dilated bowel loops have a central predominance consistent with ascites. Paucity of large bowel gas. No free intraperitoneal air. IMPRESSION: Persistent and unchanged small bowel obstruction. No evidence for perforation. Ascites . Electronically Signed   By: Nolon Nations M.D.   On: 12/03/2016 10:28   Dg Abd 2 Views  Result Date: 12/02/2016 CLINICAL DATA:  Follow-up exam for small bowel obstruction. EXAM: ABDOMEN - 2 VIEW COMPARISON:  Comparison made with CT and radiograph from 12/01/2016. FINDINGS: NG tube in place with tip in side hole overlying the stomach. Persistent dilated air-filled loops of bowel seen scattered throughout the mid abdomen, measuring up to 4 cm in diameter. Associated air-fluid levels present on lateral decubitus view. Findings consistent with ongoing obstruction. Overall, appearance not significantly changed. Loops of bowel are centralized within the abdomen, like related to previously seen ascites. No other new abnormality.  No  free air. Visualized osseous structures unchanged. IMPRESSION: 1. Dilated loops of small bowel clustered within the mid abdomen with associated internal air-fluid levels, consistent with persistent small-bowel obstruction. Overall, appearance not significantly changed relative to most recent radiograph from 12/01/2016. NG tube in place within the stomach. 2. Centralization of loops of bowel within the abdomen, consistent with previously seen ascites. Electronically Signed   By: Jeannine Boga M.D.   On: 12/02/2016 17:07   Dg Abd 2 Views  Result Date: 11/30/2016 CLINICAL DATA:  Weakness and diffuse abdominal pain, continuing this admission. EXAM: ABDOMEN - 2 VIEW COMPARISON:  CT of the abdomen and pelvis 11/25/2016 FINDINGS: There has been interval development of significant small bowel dilatation. No evidence for free intraperitoneal air. Air-fluid levels are identified within the small bowel loops. There is paucity of large bowel gas. IMPRESSION: Interval development of small bowel obstruction. Electronically Signed   By: Nolon Nations M.D.   On: 11/30/2016 19:56    Microbiology: Recent Results (from the past 240 hour(s))  MRSA PCR Screening     Status: None   Collection Time: 12/03/16  1:40 PM  Result Value Ref Range Status   MRSA by PCR NEGATIVE NEGATIVE Final    Comment:        The GeneXpert MRSA Assay (FDA approved for NASAL specimens only), is one component of a comprehensive MRSA colonization surveillance program. It is not intended to diagnose MRSA infection nor to guide or monitor treatment for MRSA infections.      Labs: Basic Metabolic Panel:  Recent Labs Lab 12/04/16 0355 12/05/16 0424 12/06/16 0359 12/07/16 0357 12/08/16 0339  NA 132* 131* 130* 131* 131*  K 4.3 4.3 4.1 3.8 3.7  CL 97* 99* 96* 97* 99*  CO2 26 25 26 28 24   GLUCOSE 201* 269* 217* 258* 133*  BUN 9 9 6  <5* <5*  CREATININE 0.77 0.82 0.72 0.68 0.69  CALCIUM 8.4* 7.9* 8.4* 8.6* 8.3*   Liver  Function Tests: No results for input(s): AST, ALT, ALKPHOS, BILITOT, PROT, ALBUMIN in the last 168 hours. No results for input(s): LIPASE, AMYLASE in the last 168 hours. No results for input(s): AMMONIA in the last 168 hours. CBC:  Recent Labs Lab 12/04/16 0355 12/05/16 0424 12/06/16 0359 12/07/16 0357 12/08/16 0339  WBC 20.8* 12.1* 11.1* 9.3 9.1  HGB 10.9* 9.5* 10.8* 10.5* 10.8*  HCT 31.6* 28.6* 31.5* 31.5* 32.1*  MCV 89.5 91.7 90.3 90.0 89.4  PLT 348 344 352 379 371   Cardiac Enzymes: No results for input(s):  CKTOTAL, CKMB, CKMBINDEX, TROPONINI in the last 168 hours. BNP: BNP (last 3 results) No results for input(s): BNP in the last 8760 hours.  ProBNP (last 3 results) No results for input(s): PROBNP in the last 8760 hours.  CBG:  Recent Labs Lab 12/08/16 1725 12/08/16 2024 12/08/16 2356 12/09/16 0410 12/09/16 0754  GLUCAP 281* 248* 117* 118* 145*

## 2016-12-09 NOTE — Progress Notes (Signed)
Nutrition Follow-up  DOCUMENTATION CODES:   Severe malnutrition in context of acute illness/injury  INTERVENTION:   -D/c Boost Breeze -Provide Ensure Enlive po BID, each supplement provides 350 kcal and 20 grams of protein -Encourage PO intake -RD to continue to monitor  NUTRITION DIAGNOSIS:   Malnutrition (Severe) related to acute illness (SBO) as evidenced by energy intake < or equal to 50% for > or equal to 5 days, moderate depletion of body fat, moderate depletions of muscle mass.  Ongoing.  GOAL:   Patient will meet greater than or equal to 90% of their needs  Progressing.  MONITOR:   PO intake, Supplement acceptance, Labs, Weight trends, Skin, I & O's  ASSESSMENT:   83 y.o.femalewith a past medical history significant for IDDM, hypothyroidism and HTNwho presents with 2 days abdominal pain, malaise and now confusion.  Pt in room with daughter at bedside. [name] trying to decide what to order patient that is within a soft diet. Pt has not been drinking Boost Breeze supplements per daughter. Pt drinks Ensure supplements at home, will order these instead. Per surgery, pt okay to discharge if tolerates soft diet meal.  Encourage pt to drink at least 2 Ensure supplements a day after discharge.   Medications: IV Protonix daily  Labs reviewed: CBGs: 118-145 Low Na  Diet Order:  Diet - low sodium heart healthy DIET SOFT Room service appropriate? Yes; Fluid consistency: Thin  Skin:  Wound (see comment) (Stage II buttocks ulcer)  Last BM:  5/22  Height:   Ht Readings from Last 1 Encounters:  11/29/16 5\' 7"  (1.702 m)    Weight:   Wt Readings from Last 1 Encounters:  11/29/16 137 lb (62.1 kg)    Ideal Body Weight:  61.4 kg  BMI:  Body mass index is 21.46 kg/m.  Estimated Nutritional Needs:   Kcal:  1500-1700  Protein:  70-80g  Fluid:  1.7L/day  EDUCATION NEEDS:   No education needs identified at this time  Clayton Bibles, MS, RD,  LDN Pager: [phone] After Hours Pager: [phone]

## 2016-12-09 NOTE — Clinical Social Work Placement (Addendum)
Pt DC to [facility]. (room 907)  Pt will transport by [facility]_ CSW completed medical necessity form and called for transport.  Pt and daughters at bedside aware and agreeable to plan.  All information sent to facility via [facility] number for RN [phone]  No other needs.  Plan - PT DC to [facility] today.    CLINICAL SOCIAL WORK PLACEMENT  NOTE  Date:  12/09/2016  Patient Details  Name: [name] [name] MRN: [id number] Date of Birth: 09/15/1933  Clinical Social Work is seeking post-discharge placement for this patient at the [place] level of care (*CSW will initial, date and re-position this form in  chart as items are completed):  Yes   Patient/family provided with [place] Work Department's list of facilities offering this level of care within the geographic area requested by the patient (or if unable, by the patient's family).  Yes   Patient/family informed of their freedom to choose among providers that offer the needed level of care, that participate in Medicare, Medicaid or managed care program needed by the patient, have an available bed and are willing to accept the patient.  Yes   Patient/family informed of [facility]'s ownership interest in [facility] and [facility], as well as of the fact that they are under no obligation to receive care at these facilities.  PASRR submitted to EDS on 12/03/16     PASRR number received on 12/03/16     Existing PASRR number confirmed on       FL2 transmitted to all facilities in geographic area requested by pt/family on 12/03/16     FL2 transmitted to all facilities within larger geographic area on       Patient informed that his/her managed care company has contracts with or will negotiate with certain facilities, including the following:        Yes   Patient/family informed of bed offers received.  Patient chooses bed at [facility]     Physician recommends and  patient chooses bed at [facility]    Patient to be transferred to [facility] on 12/09/16.  Patient to be transferred to facility by [facility]     Patient family notified on 12/09/16 of transfer.  Name of family member notified:  daughters Artis Flock     PHYSICIAN Please prepare prescriptions     Additional Comment:    _______________________________________________ Nila Nephew, LCSW 12/09/2016, 1:26 PM  [phone]

## 2016-12-09 NOTE — Discharge Instructions (Signed)
Small Bowel Obstruction °A small bowel obstruction means that something is blocking the small bowel. The small bowel is also called the small intestine. It is the long tube that connects the stomach to the colon. An obstruction will stop food and fluids from passing through the small bowel. Treatment depends on what is causing the problem and how bad the problem is. °Follow these instructions at home: °· Get a lot of rest. °· Follow your diet as told by your doctor. You may need to: °¨ Only drink clear liquids until you start to get better. °¨ Avoid solid foods as told by your doctor. °· Take over-the-counter and prescription medicines only as told by your doctor. °· Keep all follow-up visits as told by your doctor. This is important. °Contact a doctor if: °· You have a fever. °· You have chills. °Get help right away if: °· You have pain or cramps that get worse. °· You throw up (vomit) blood. °· You have a feeling of being sick to your stomach (nausea) that does not go away. °· You cannot stop throwing up. °· You cannot drink fluids. °· You feel confused. °· You feel dry or thirsty (dehydrated). °· Your belly gets more bloated. °· You feel weak or you pass out (faint). °This information is not intended to replace advice given to you by your health care provider. Make sure you discuss any questions you have with your health care provider. °Document Released: 08/13/2004 Document Revised: 03/02/2016 Document Reviewed: 08/30/2014 °Elsevier Interactive Patient Education © 2017 [employer] Inc. ° °

## 2016-12-09 NOTE — Progress Notes (Addendum)
CSW following for disposition/ DC planning.  Per [facility] authorization received for SNF. Facility spoke with daughter re: coming to complete admission paperwork.  Spoke with pt and daughter at bedside. Family still agreeable to SNF at [facility] after touring facility yesterday. Updated MD.   Will continue following to assist.   Sharren Bridge, MSW, LCSW Clinical Social Work 12/09/2016 [phone]

## 2016-12-12 ENCOUNTER — Non-Acute Institutional Stay (SKILLED_NURSING_FACILITY): Payer: Medicare HMO | Admitting: Internal Medicine

## 2016-12-12 DIAGNOSIS — R1033 Periumbilical pain: Secondary | ICD-10-CM

## 2016-12-12 DIAGNOSIS — K565 Intestinal adhesions [bands], unspecified as to partial versus complete obstruction: Secondary | ICD-10-CM | POA: Diagnosis not present

## 2016-12-12 DIAGNOSIS — E1142 Type 2 diabetes mellitus with diabetic polyneuropathy: Secondary | ICD-10-CM | POA: Diagnosis not present

## 2016-12-12 NOTE — Progress Notes (Signed)
Facility; [facility] facility  12/12/16  Chief complaint admission to SNF post stay at [facility] 12/15/16 through 12/09/16  History; this is an 83 year old woman who is a type II diabetic on insulin. She presented with abdominal pain and increased confusion. She was noted to have urinary retention and had a Foley catheter placed. She had a lipase in the 70s on presentation but her CT scan was completely unremarkable in terms of her pancreas but did show small bowel obstruction. She was seen by general surgery and eventually required surgery with lysis of adhesions on 12/03/16. Although it is not specifically stated that she had a reasonably prolonged postoperative course which required an NG tube that was subsequently removed. She is now on a regular diet. She was also noted to have a UTI and was treated with antibiotics that I don't see that any cultures were positive.  Today the patient is complaining of abdominal pain which is worse when she tries to eat or drink. She feels that this really curtails what she can eat. She does not have nausea or vomiting. She is having small formed bowel movements and is passing gas. She is not febrile with a temperature of 97.9 this morning. Facility reports that her intake is less than 10%.  CBC Latest Ref Rng & Units 12/08/2016 12/07/2016 12/06/2016  WBC 4.0 - 10.5 K/uL 9.1 9.3 11.1(H)  Hemoglobin 12.0 - 15.0 g/dL 10.8(L) 10.5(L) 10.8(L)  Hematocrit 36.0 - 46.0 % 32.1(L) 31.5(L) 31.5(L)  Platelets 150 - 400 K/uL 371 379 352    CMP Latest Ref Rng & Units 12/08/2016 12/07/2016 12/06/2016  Glucose 65 - 99 mg/dL 133(H) 258(H) 217(H)  BUN 6 - 20 mg/dL <5(L) <5(L) 6  Creatinine 0.44 - 1.00 mg/dL 0.69 0.68 0.72  Sodium 135 - 145 mmol/L 131(L) 131(L) 130(L)  Potassium 3.5 - 5.1 mmol/L 3.7 3.8 4.1  Chloride 101 - 111 mmol/L 99(L) 97(L) 96(L)  CO2 22 - 32 mmol/L 24 28 26   Calcium 8.9 - 10.3 mg/dL 8.3(L) 8.6(L) 8.4(L)  Total Protein 6.5 - 8.1 g/dL - - -  Total  Bilirubin 0.3 - 1.2 mg/dL - - -  Alkaline Phos 38 - 126 U/L - - -  AST 15 - 41 U/L - - -  ALT 14 - 54 U/L - - -   CLINICAL DATA:  confusion, malaise and abdominal pain. Sepsis, abdominal pain.   EXAM: CT ABDOMEN AND PELVIS WITH CONTRAST   TECHNIQUE: Multidetector CT imaging of the abdomen and pelvis was performed using the standard protocol following bolus administration of intravenous contrast.   CONTRAST:  167mL ISOVUE-300 IOPAMIDOL (ISOVUE-300) INJECTION 61%, 29mL ISOVUE-300 IOPAMIDOL (ISOVUE-300) INJECTION 61%   COMPARISON:  CT abdomen dated 11/25/2016.   FINDINGS: Lower chest: Bilateral pleural effusions, at least moderate in size, incompletely imaged, with adjacent compressive atelectasis.   Hepatobiliary: No focal liver abnormality is seen. No gallstones, gallbladder wall thickening, or biliary dilatation.   Pancreas: Unremarkable. No pancreatic ductal dilatation or surrounding inflammatory changes.   Spleen: Normal in size without focal abnormality.   Adrenals/Urinary Tract: Adrenal glands appear normal. Left renal cyst. Kidneys otherwise unremarkable without stone or hydronephrosis. No ureteral or bladder calculi identified. Bladder is decompressed by Foley catheter.   Stomach/Bowel: Diffusely dilated fluid-filled small bowel throughout the abdomen and pelvis. Two possible transition zones identified, left and right hemipelvis, with decompression of the distal small bowel (series 2, images 69 and 70 ; series 2, images 63 through 65).   Colon is  diffusely decompressed indicating complete versus high-grade partial small bowel obstruction.   Vascular/Lymphatic: Aortic atherosclerosis. No enlarged abdominal or pelvic lymph nodes.   Reproductive: Status post hysterectomy.   Other: Moderate amount of free fluid in the abdomen and pelvis. No circumscribed abscess collection identified. No free intraperitoneal air seen.   Musculoskeletal: Degenerative changes  throughout the thoracolumbar spine, mild to moderate in degree. No acute or suspicious osseous finding.   Diffuse ill-defined fluid/edema within the subcutaneous soft tissues of the abdomen and pelvis, consistent with anasarca.   IMPRESSION: 1. Small-bowel obstruction, with diffusely dilated fluid-filled small bowel throughout the abdomen and pelvis, with decompressed distal small bowel and colon, consistent with complete small bowel obstruction versus high-grade partial obstruction. Two possible sites of obstruction within the pelvis raising the possibility of closed loop obstruction. 2. Moderate amount of ascites within the abdomen and pelvis. No abscess collection seen. 3. Bilateral pleural effusions, at least moderate in size, incompletely imaged. 4. Anasarca. 5. Aortic atherosclerosis.   These results were called by telephone at the time of interpretation on 12/01/2016 at 3:32 pm to Dr. Sander Radon , who verbally acknowledged these results.     Electronically Signed    Past Medical History:  Diagnosis Date  . Arthritis    "knees, legs" (06/10/2016)  . Ascites   . Chronic kidney disease    "related to my diabetes"  . Chronic lower back pain   . Diabetes mellitus without complication ([place])   . GERD (gastroesophageal reflux disease)   . High cholesterol   . Hyperlipidemia   . Hypertension   . Hypothyroidism   . OAB (overactive bladder)   . Osteoporosis   . Thyroid disease   . Type II diabetes mellitus ([place])     Past Surgical History:  Procedure Laterality Date  . ABDOMINAL HYSTERECTOMY    . KNEE ARTHROSCOPY    . LAPAROTOMY N/A 12/03/2016   Procedure: EXPLORATORY LAPAROTOMY, LYSIS OF ADHESIONS;  Surgeon: Armandina Gemma, MD;  Location: [facility] ORS;  Service: General;  Laterality: N/A;  . vocal cord polyps     Current Outpatient Prescriptions on File Prior to Visit  Medication Sig Dispense Refill  . acetaminophen (TYLENOL) 500 MG tablet Take 1 tablet (500 mg  total) by mouth every 6 (six) hours as needed (pain). 30 tablet 0  . alendronate (FOSAMAX) 70 MG tablet Take 70 mg by mouth every 7 (seven) days.     Marland Kitchen aspirin EC 81 MG tablet Take 81 mg by mouth daily.    . Calcium Carbonate-Vitamin D (CALCIUM-D PO) Take 2 tablets by mouth 2 (two) times daily.    . cholecalciferol (VITAMIN D) 1000 UNITS tablet Take 1,000 Units by mouth daily.    . clindamycin (CLEOCIN) 2 % vaginal cream Place 1 Applicatorful vaginally at bedtime. 40 g 0  . cyclobenzaprine (FLEXERIL) 5 MG tablet Take 5 mg by mouth 3 (three) times daily as needed for muscle spasms.    . feeding supplement, ENSURE ENLIVE, (ENSURE ENLIVE) LIQD Take 237 mLs by mouth 2 (two) times daily between meals. 237 mL 12  . insulin aspart (NOVOLOG) 100 UNIT/ML injection Inject 7-8 Units into the skin See admin instructions. Inject 7 units subcutaneously at breakfast and lunch and 8 units at supper    . Insulin Detemir (LEVEMIR FLEXTOUCH) 100 UNIT/ML Pen Inject 8 Units into the skin 2 (two) times daily. 15 mL 11  . iron polysaccharides (NIFEREX) 150 MG capsule Take 150 mg by mouth 2 (two) times daily.     Marland Kitchen  levothyroxine (SYNTHROID, LEVOTHROID) 100 MCG tablet Take 1 tablet (100 mcg total) by mouth daily. 30 tablet 3  . loratadine (CLARITIN) 10 MG tablet Take 10 mg by mouth every other day.     . losartan (COZAAR) 25 MG tablet Take 1 tablet (25 mg total) by mouth daily. 30 tablet 3  . meclizine (ANTIVERT) 12.5 MG tablet Take 1 tablet (12.5 mg total) by mouth 3 (three) times daily as needed for dizziness. 30 tablet 0  . metFORMIN (GLUCOPHAGE) 1000 MG tablet Take 1,000 mg by mouth 2 (two) times daily with a meal.    . mometasone (NASONEX) 50 MCG/ACT nasal spray Place 2 sprays into the nose 2 (two) times daily as needed (seasonal allergies).    . naproxen sodium (ALEVE) 220 MG tablet Take 2 tablets (440 mg total) by mouth 2 (two) times daily as needed (pain). 20 tablet 0  . NONFORMULARY OR COMPOUNDED ITEM Estradiol  0.02 % 23ml prefilled applicator Sig: apply twice a week 24 each 4  . nystatin (MYCOSTATIN) 100000 UNIT/ML suspension Take 5 mLs by mouth 2 (two) times daily. For mouth    . Omega-3 Fatty Acids (FISH OIL PO) Take 1 capsule by mouth 2 (two) times daily.    . ondansetron (ZOFRAN ODT) 4 MG disintegrating tablet Take 1 tablet (4 mg total) by mouth every 8 (eight) hours as needed for nausea or vomiting. 6 tablet 0  . pantoprazole (PROTONIX) 40 MG tablet Take 1 tablet (40 mg total) by mouth daily. 30 tablet 3  . polyethylene glycol (MIRALAX / GLYCOLAX) packet Take 17 g by mouth every Monday, Wednesday, and Friday. Mix in 8 oz liquid and drink    . simvastatin (ZOCOR) 40 MG tablet TAKE 1 TABLET BY MOUTH EVERY EVENING. NEED OFFICE VISIT FOR MORE REFILLS 30 tablet 2   Social; patient states she lives on her own in [place] and was independent prior to admission. Nonsmoker.  reports that she has never smoked. She quit smokeless tobacco use about 33 years ago. Her smokeless tobacco use included Chew. She reports that she does not drink alcohol or use drugs.  family history includes Diabetes in her brother, mother, and sister; Hypertension in her mother.  Review of systems Gen. patient states she feels weak HEENT no sore throat no oral pain Respiratory no shortness of breath Cardiac no chest pain GI complaining of abdominal pain which is worse when she eats or drinks. GU no dysuria or current complaints of voiding difficulties Extremities family concerned about swelling in her extremities. She has neuropathy  Physical examination Gen. patient sitting up in a recliner. Looks in no distress Gen. temperature 97.9 blood pressure 135/64 pulse 75 respirations 18 O2 sat is 98% on room air HEENT; what looks to be slight amount of thrush on her tongue Respiratory decreased air entry bilaterally no wheezing Cardiac soft midsystolic murmur she is euvolemic Abdomen; still has a dressing over her surgical  incision that still has staples. There is slight midabdominal/emboli: Abdominal distention. However there is no tenderness to palpation. Bowel sounds are reduced but episodically present. No masses are noted GU I don't believe she has bladder distention no CVA tenderness no bladder tenderness Extremities reduced peripheral pulses bilaterally in her feet changes of diabetic PAD and neuropathy. There is no edema Skin she apparently has a stage II pressure ulcer on her buttock I reviewed the orders did not look at the actual wound [] ] Mental status; somewhat desponded family in the room.  Impression/plan #1  small bowel obstruction secondary to adhesions status post surgical lysis of adhesions. She is complaining of abdominal pain however she only has mild abdominal distention. Bowel sounds are reduced. She is passing gas and has had bowel movements. I've encouraged mobility. I'll reduce her diet to mechanical soft encourage oral intake. She does not appear to have had a postoperative abdominal views #2 type 2 diabetes on insulin. Hemoglobin A1c was 8.9 on 11/27/16. She also has neuropathy and PAD. #3 she is on nystatin for thrush which seems reasonable. #4 abdominal pain at this point I am reluctant to give her narcotics. I will consider Ultram when necessary. #5 her declining oral intake is concerning. In this setting I'm going to put her Fosamax on hold 6 CT scan of the abdomen done at the time of her acute illness showed bilateral pleural effusions these may not have completely resolved. She does not have obvious heart failure. Last albumin I see on her chart with 3.1.  Diet downgrade, encourage ambulation. Lab work next week. No urgency to do x-rays at the moment although that may become necessary

## 2016-12-16 ENCOUNTER — Encounter: Payer: Self-pay | Admitting: Family

## 2016-12-16 ENCOUNTER — Non-Acute Institutional Stay (SKILLED_NURSING_FACILITY): Payer: Medicare HMO | Admitting: Family

## 2016-12-16 DIAGNOSIS — L7682 Other postprocedural complications of skin and subcutaneous tissue: Secondary | ICD-10-CM | POA: Diagnosis not present

## 2016-12-16 DIAGNOSIS — E119 Type 2 diabetes mellitus without complications: Secondary | ICD-10-CM | POA: Diagnosis not present

## 2016-12-16 DIAGNOSIS — Z794 Long term (current) use of insulin: Secondary | ICD-10-CM | POA: Diagnosis not present

## 2016-12-16 NOTE — Progress Notes (Signed)
Location:  [facility] Room Number: 749S Place of Service:  SNF [phone]) Provider: Nasri Boakye FNP-C  Aura Dials, MD  Patient Care Team: Aura Dials, MD as PCP - General (Family Medicine) Boykin Nearing, MD [facility] Medicine)  Extended Emergency Contact Information Primary Emergency Contact: [facility] Address: [address]          [place], [place] [address] Johnnette Litter of [place] Phone: [phone] Relation: Daughter Secondary Emergency Contact: [facility] Address: [address] [place], [place] [address] Johnnette Litter of [place] Phone: [phone] Relation: Daughter  Code Status: Full Code  Goals of care: Advanced Directive information Advanced Directives 12/16/2016  Does Patient Have a Medical Advance Directive? Yes  Type of Advance Directive [place]  Does patient want to make changes to medical advance directive? -  Copy of [place] in Chart? Yes  Would patient like information on creating a medical advance directive? -     Chief Complaint  Patient presents with  . Acute Visit    pain; high blood sugars     HPI:  Pt is a 83 y.o. female seen today at [facility] for an acute visit for evaluation of abdominal surgical pain and high blood sugars. She has a significant medical history of type 2 Diabetes mellitus, HTN, Hypothyroidism, GERD, DJD among other conditions. She is status post small bowel obstruction and lysis of adhesions on 12/03/2016. She complains of pain on her abdominal incision site.she states had Bowel movement today.she denies any fever, chills, nausea, diarrhea, constipation or vomiting. Family member states current pain medical is as needed but patient does not request for medication on time due to her cognitive impairment.Request medication to be scheduled for now.Also states patient's blood sugars have  been in the 400's at times. Although states patient has poor appetite. Family brings food at times from home. Facility CBG's log reviewed ranging in the 70's- 200's with sporadic multiple 400's.   Past Medical History:  Diagnosis Date  . Arthritis    "knees, legs" (06/10/2016)  . Ascites   . Chronic kidney disease    "related to my diabetes"  . Chronic lower back pain   . Diabetes mellitus without complication ([place])   . GERD (gastroesophageal reflux disease)   . High cholesterol   . Hyperlipidemia   . Hypertension   . Hypothyroidism   . OAB (overactive bladder)   . Osteoporosis   . Thyroid disease   . Type II diabetes mellitus ([place])    Past Surgical History:  Procedure Laterality Date  . ABDOMINAL HYSTERECTOMY    . KNEE ARTHROSCOPY    . LAPAROTOMY N/A 12/03/2016   Procedure: EXPLORATORY LAPAROTOMY, LYSIS OF ADHESIONS;  Surgeon: Armandina Gemma, MD;  Location: [facility]S;  Service: General;  Laterality: N/A;  . vocal cord polyps      Allergies  Allergen Reactions  . Lisinopril Cough    Reported by [facility]  . Sulfa Antibiotics Hives  . Sulfonamide Derivatives Hives  . Bactrim [Sulfamethoxazole-Trimethoprim] Rash    Reported by [facility]    Allergies as of 12/16/2016      Reactions   Lisinopril Cough   Reported by [facility]   Sulfa Antibiotics Hives   Sulfonamide Derivatives Hives   Bactrim [sulfamethoxazole-trimethoprim] Rash   Reported by [facility]      Medication List  Accurate as of 12/16/16  4:33 PM. Always use your most recent med list.          acetaminophen 500 MG tablet Commonly known as:  TYLENOL Take 1 tablet (500 mg total) by mouth every 6 (six) hours as needed (pain).   aspirin EC 81 MG tablet Take 81 mg by mouth daily.   CALCIUM-D PO Take 2 tablets by mouth 2 (two) times daily.   cholecalciferol 1000 units tablet Commonly known as:  VITAMIN D Take 1,000 Units by mouth daily.   clindamycin 2 % vaginal  cream Commonly known as:  CLEOCIN Place 1 Applicatorful vaginally at bedtime.   cyclobenzaprine 5 MG tablet Commonly known as:  FLEXERIL Take 5 mg by mouth 3 (three) times daily as needed for muscle spasms.   feeding supplement (ENSURE ENLIVE) Liqd Take 237 mLs by mouth 2 (two) times daily between meals.   FISH OIL PO Take 1,000 mg by mouth 2 (two) times daily.   insulin aspart 100 UNIT/ML injection Commonly known as:  novoLOG Inject 7-8 Units into the skin See admin instructions. Inject 7 units subcutaneously at breakfast and lunch and 8 units at supper   Insulin Detemir 100 UNIT/ML Pen Commonly known as:  LEVEMIR FLEXTOUCH Inject 8 Units into the skin 2 (two) times daily.   iron polysaccharides 150 MG capsule Commonly known as:  NIFEREX Take 150 mg by mouth 2 (two) times daily.   levothyroxine 100 MCG tablet Commonly known as:  SYNTHROID, LEVOTHROID Take 1 tablet (100 mcg total) by mouth daily.   loratadine 10 MG tablet Commonly known as:  CLARITIN Take 10 mg by mouth every other day.   losartan 25 MG tablet Commonly known as:  COZAAR Take 1 tablet (25 mg total) by mouth daily.   meclizine 12.5 MG tablet Commonly known as:  ANTIVERT Take 1 tablet (12.5 mg total) by mouth 3 (three) times daily as needed for dizziness.   metFORMIN 1000 MG tablet Commonly known as:  GLUCOPHAGE Take 1,000 mg by mouth 2 (two) times daily with a meal.   mometasone 50 MCG/ACT nasal spray Commonly known as:  NASONEX Place 2 sprays into the nose 2 (two) times daily as needed (seasonal allergies).   multivitamin tablet Take 1 tablet by mouth daily.   NONFORMULARY OR COMPOUNDED ITEM Estradiol 0.02 % 58ml prefilled applicator Sig: apply twice a week   nystatin 100000 UNIT/ML suspension Commonly known as:  MYCOSTATIN Take 5 mLs by mouth 2 (two) times daily. For mouth   ondansetron 4 MG disintegrating tablet Commonly known as:  ZOFRAN ODT Take 1 tablet (4 mg total) by mouth every 8  (eight) hours as needed for nausea or vomiting.   pantoprazole 40 MG tablet Commonly known as:  PROTONIX Take 1 tablet (40 mg total) by mouth daily.   polyethylene glycol packet Commonly known as:  MIRALAX / GLYCOLAX Take 17 g by mouth every Monday, Wednesday, and Friday. Mix in 8 oz liquid and drink   simvastatin 40 MG tablet Commonly known as:  ZOCOR TAKE 1 TABLET BY MOUTH EVERY EVENING. NEED OFFICE VISIT FOR MORE REFILLS   traMADol 50 MG tablet Commonly known as:  ULTRAM Take 50 mg by mouth every 6 (six) hours as needed for moderate pain.       Review of Systems  Constitutional: Positive for appetite change. Negative for activity change, chills, fatigue and fever.  HENT: Negative for congestion, rhinorrhea, sinus pain, sinus pressure, sneezing and sore throat.   Eyes:  Negative.   Respiratory: Negative for cough, chest tightness, shortness of breath and wheezing.   Cardiovascular: Negative for chest pain, palpitations and leg swelling.  Gastrointestinal: Negative for abdominal distention, abdominal pain, constipation, diarrhea, nausea and vomiting.       Painful abdominal surgical incision   Endocrine: Negative.   Genitourinary: Negative for dysuria, flank pain, frequency and urgency.  Musculoskeletal: Positive for gait problem.  Skin: Negative for color change, pallor and rash.  Neurological: Negative for dizziness, syncope, light-headedness and headaches.  Hematological: Does not bruise/bleed easily.  Psychiatric/Behavioral: Negative for agitation, confusion, hallucinations and sleep disturbance. The patient is not nervous/anxious.     Immunization History  Administered Date(s) Administered  . Influenza,inj,Quad PF,36+ Mos 03/26/2016  . PPD Test 12/10/2016  . Pneumococcal Polysaccharide-23 05/06/2015  . Tdap 11/03/2013   Pertinent  Health Maintenance Due  Topic Date Due  . OPHTHALMOLOGY EXAM  09/18/2015  . FOOT EXAM  05/05/2016  . PNA vac Low Risk Adult (2 of 2 -  PCV13) 05/05/2016  . INFLUENZA VACCINE  02/17/2017  . HEMOGLOBIN A1C  05/30/2017  . DEXA SCAN  Completed   Fall Risk  06/10/2015 05/06/2015 02/19/2015 09/25/2014  Falls in the past year? Yes No No No  Number falls in past yr: 1 - - -  Injury with Fall? No - - -    Vitals:   12/16/16 1359  BP: 131/67  Pulse: 88  Resp: 20  Temp: 98.2 F (36.8 C)  SpO2: 97%  Weight: 137 lb (62.1 kg)  Height: 5\' 7"  (1.702 m)   Body mass index is 21.46 kg/m. Physical Exam  Constitutional: She is oriented to person, place, and time. She appears well-developed and well-nourished. No distress.  Thin frail elderly in no acute distress.   HENT:  Head: Normocephalic.  Mouth/Throat: Oropharynx is clear and moist. No oropharyngeal exudate.  Eyes: Conjunctivae and EOM are normal. Pupils are equal, round, and reactive to light. Right eye exhibits no discharge. Left eye exhibits no discharge. No scleral icterus.  Neck: Normal range of motion. No JVD present. No thyromegaly present.  Cardiovascular: Normal rate, regular rhythm, normal heart sounds and intact distal pulses.  Exam reveals no gallop and no friction rub.   No murmur heard. Pulmonary/Chest: Effort normal and breath sounds normal. No respiratory distress. She has no wheezes. She has no rales.  Abdominal: Soft. Bowel sounds are normal. She exhibits no distension. There is no tenderness. There is no rebound and no guarding.  Mid-Abdominal surgical incision   Musculoskeletal: She exhibits no edema, tenderness or deformity.  Unsteady gait   Lymphadenopathy:    She has no cervical adenopathy.  Neurological: She is oriented to person, place, and time.  Skin: Skin is warm and dry. No rash noted. No erythema. No pallor.  Mid surgical incision with honey comb drsg dry and intact. Old dry bloody small amounts of drainage noted on 6 O'clock site.surrounding skin tissues without any signs of infections.   Psychiatric: She has a normal mood and affect.     Labs reviewed:  Recent Labs  11/26/16 0106  12/06/16 0359 12/07/16 0357 12/08/16 0339  NA 137  < > 130* 131* 131*  K 4.2  < > 4.1 3.8 3.7  CL 106  < > 96* 97* 99*  CO2 17*  < > 26 28 24   GLUCOSE 398*  < > 217* 258* 133*  BUN 40*  < > 6 <5* <5*  CREATININE 1.57*  < > 0.72 0.68 0.69  CALCIUM 9.0  < > 8.4* 8.6* 8.3*  MG 1.9  --   --   --   --   PHOS 2.8  --   --   --   --   < > = values in this interval not displayed.  Recent Labs  11/24/16 2159 11/25/16 1506 11/28/16 0403  AST 28 27 19   ALT 29 29 16   ALKPHOS 62 71 53  BILITOT 0.5 1.1 0.6  PROT 7.5 7.4 6.0*  ALBUMIN 4.4 4.2 3.1*    Recent Labs  11/27/16 0320 11/28/16 0403 11/29/16 0404  12/06/16 0359 12/07/16 0357 12/08/16 0339  WBC 8.8 9.1 8.9  < > 11.1* 9.3 9.1  NEUTROABS 6.9 7.4 7.4  --   --   --   --   HGB 10.0* 12.5 12.3  < > 10.8* 10.5* 10.8*  HCT 30.1* 36.6 36.5  < > 31.5* 31.5* 32.1*  MCV 90.7 89.9 90.6  < > 90.3 90.0 89.4  PLT 253 284 286  < > 352 379 371  < > = values in this interval not displayed. Lab Results  Component Value Date   TSH 0.903 11/27/2016   Lab Results  Component Value Date   HGBA1C 8.9 (H) 11/27/2016   Lab Results  Component Value Date   CHOL 187 02/02/2008   HDL 90.6 02/02/2008   LDLCALC 82 02/02/2008   LDLDIRECT 101.2 01/28/2007   TRIG 74 02/02/2008   CHOLHDL 2.1 CALC 02/02/2008    Assessment/Plan 1. Pain at surgical incision Afebrile. Mid surgical incision with honey comb drsg dry and intact. Old dry bloody small amounts of drainage noted on 6 O'clock site.surrounding skin tissues without any signs of infections. Will changed tramadol 50 mg Tablet to every one tablet every 6 hours hold for sedation. Continue to follow up with surg as directed.    2. Type 2 diabetes mellitus without complication, with long-term current use of insulin  Lab Results  Component Value Date   HGBA1C 8.9 (H) 11/27/2016    CBG's log reviewed ranging in the 70's- 200's with sporadic  multiple 400's.  Continue on Levemir 8 units twice daily, Metformin 1000 mg tablet twice daily. Discontinue current Novolog ordered then start Novolog 8 units SQ three times with meals. Continue to monitor.   Family/ staff Communication: Reviewed plan of care with patient and facility Nurse supervisor  Labs/tests ordered: None   Sandrea Hughs, NP

## 2016-12-17 ENCOUNTER — Encounter (HOSPITAL_COMMUNITY): Payer: Self-pay

## 2016-12-17 ENCOUNTER — Emergency Department (HOSPITAL_COMMUNITY)
Admission: EM | Admit: 2016-12-17 | Discharge: 2016-12-17 | Disposition: A | Discharge status: Home / Self Care | Payer: Medicare HMO | Attending: Emergency Medicine | Admitting: Emergency Medicine

## 2016-12-17 DIAGNOSIS — Z7982 Long term (current) use of aspirin: Secondary | ICD-10-CM | POA: Diagnosis not present

## 2016-12-17 DIAGNOSIS — Z794 Long term (current) use of insulin: Secondary | ICD-10-CM | POA: Diagnosis not present

## 2016-12-17 DIAGNOSIS — I129 Hypertensive chronic kidney disease with stage 1 through stage 4 chronic kidney disease, or unspecified chronic kidney disease: Secondary | ICD-10-CM | POA: Insufficient documentation

## 2016-12-17 DIAGNOSIS — E11649 Type 2 diabetes mellitus with hypoglycemia without coma: Secondary | ICD-10-CM | POA: Insufficient documentation

## 2016-12-17 DIAGNOSIS — N189 Chronic kidney disease, unspecified: Secondary | ICD-10-CM | POA: Insufficient documentation

## 2016-12-17 DIAGNOSIS — Z79899 Other long term (current) drug therapy: Secondary | ICD-10-CM | POA: Insufficient documentation

## 2016-12-17 DIAGNOSIS — E1122 Type 2 diabetes mellitus with diabetic chronic kidney disease: Secondary | ICD-10-CM | POA: Diagnosis not present

## 2016-12-17 DIAGNOSIS — E039 Hypothyroidism, unspecified: Secondary | ICD-10-CM | POA: Diagnosis not present

## 2016-12-17 DIAGNOSIS — E162 Hypoglycemia, unspecified: Secondary | ICD-10-CM

## 2016-12-17 LAB — COMPREHENSIVE METABOLIC PANEL
ALT: 18 U/L (ref 14–54)
AST: 34 U/L (ref 15–41)
Albumin: 3.1 g/dL — ABNORMAL LOW (ref 3.5–5.0)
Alkaline Phosphatase: 65 U/L (ref 38–126)
Anion gap: 10 (ref 5–15)
BUN: 8 mg/dL (ref 6–20)
CHLORIDE: 94 mmol/L — AB (ref 101–111)
CO2: 27 mmol/L (ref 22–32)
Calcium: 9.6 mg/dL (ref 8.9–10.3)
Creatinine, Ser: 0.96 mg/dL (ref 0.44–1.00)
GFR, EST NON AFRICAN AMERICAN: 54 mL/min — AB (ref >60)
Glucose, Bld: 223 mg/dL — ABNORMAL HIGH (ref 65–99)
Potassium: 4.3 mmol/L (ref 3.5–5.1)
SODIUM: 131 mmol/L — AB (ref 135–145)
Total Bilirubin: 0.3 mg/dL (ref 0.3–1.2)
Total Protein: 6.7 g/dL (ref 6.5–8.1)

## 2016-12-17 LAB — CBC WITH DIFFERENTIAL/PLATELET
BASOS ABS: 0 10*3/uL (ref 0.0–0.1)
Basophils Relative: 0 %
EOS ABS: 0 10*3/uL (ref 0.0–0.7)
EOS PCT: 0 %
HCT: 34.2 % — ABNORMAL LOW (ref 36.0–46.0)
Hemoglobin: 11.5 g/dL — ABNORMAL LOW (ref 12.0–15.0)
LYMPHS PCT: 15 %
Lymphs Abs: 1 10*3/uL (ref 0.7–4.0)
MCH: 30.7 pg (ref 26.0–34.0)
MCHC: 33.6 g/dL (ref 30.0–36.0)
MCV: 91.2 fL (ref 78.0–100.0)
MONO ABS: 0.4 10*3/uL (ref 0.1–1.0)
Monocytes Relative: 6 %
Neutro Abs: 5.4 10*3/uL (ref 1.7–7.7)
Neutrophils Relative %: 79 %
PLATELETS: 364 10*3/uL (ref 150–400)
RBC: 3.75 MIL/uL — ABNORMAL LOW (ref 3.87–5.11)
RDW: 13.8 % (ref 11.5–15.5)
WBC: 6.9 10*3/uL (ref 4.0–10.5)

## 2016-12-17 LAB — CBG MONITORING, ED
GLUCOSE-CAPILLARY: 19 mg/dL — AB (ref 65–99)
GLUCOSE-CAPILLARY: 141 mg/dL — AB (ref 65–99)
GLUCOSE-CAPILLARY: 230 mg/dL — AB (ref 65–99)
Glucose-Capillary: 147 mg/dL — ABNORMAL HIGH (ref 65–99)
Glucose-Capillary: 60 mg/dL — ABNORMAL LOW (ref 65–99)
Glucose-Capillary: 164 mg/dL — ABNORMAL HIGH (ref 65–99)

## 2016-12-17 LAB — URINALYSIS, ROUTINE W REFLEX MICROSCOPIC
BILIRUBIN URINE: NEGATIVE
GLUCOSE, UA: 50 mg/dL — AB
KETONES UR: NEGATIVE mg/dL
LEUKOCYTES UA: NEGATIVE
Nitrite: NEGATIVE
PROTEIN: NEGATIVE mg/dL
Specific Gravity, Urine: 1.011 (ref 1.005–1.030)
pH: 5 (ref 5.0–8.0)

## 2016-12-17 MED ORDER — DEXTROSE 50 % IV SOLN
50.0000 mL | Freq: Once | INTRAVENOUS | Status: AC
Start: 2016-12-17 — End: 2016-12-17
  Administered 2016-12-17: 50 mL via INTRAVENOUS
  Filled 2016-12-17: qty 50

## 2016-12-17 MED ORDER — GLUCOSE 40 % PO GEL
1.0000 | ORAL | Status: DC | PRN
  Filled 2016-12-17: qty 1

## 2016-12-17 NOTE — Discharge Instructions (Signed)
Resume previous NovoLog doses (7units with am,noon, and pm meals). Hold Insulin with poor po, or vomiting. Continue Levimir. Discuss changing insulin with meals to a sliding scale--discuss with Physician at facility tomorrow.

## 2016-12-17 NOTE — ED Notes (Signed)
Called [facility] with nurse d/c paper work read to nurse  Nurse verbalized understanding

## 2016-12-17 NOTE — ED Provider Notes (Signed)
[facility] [facility] DEPT Provider Note   CSN: [id number] Arrival date & time: 12/17/16  1540     History   Chief Complaint Chief Complaint  Patient presents with  . Hypoglycemia    HPI [name] [name] is a 83 y.o. female. Chief complaint is low blood sugar.  HPI:  83 year old female status post small bowel obstruction and laparotomy for lysis of adhesions 5/18. His been convalescing at [facility].   History of insulin-dependent diabetes. Had an episode of hypoglycemia today and was transferred here. Was 64 per EMS. Was given oral glucose and recheck was 108. However upon arrival patient is altered with blood sugar of 19. IV established and given D50. Immediately awake alert and answering questions. Able to take some by mouth juice as well.  Patient's youngest daughter arrives a short time after the patient arrives. She states that her insulin was adjusted 2 days ago. He was able to review this. She normally was on 7 units of Humalog TID. This was increased to 7units am, and 8 units lunch, and 8 units @ dinner.  Pt was at surgeon's office for suture removal today. She got home, became nauseated and vomited, then had Hypoglycemia.  Currently feels "better". Not nauseated. Had BMthis am and passing gas since. Denies AP.  Past Medical History:  Diagnosis Date  . Arthritis    "knees, legs" (06/10/2016)  . Ascites   . Chronic kidney disease    "related to my diabetes"  . Chronic lower back pain   . Diabetes mellitus without complication ([place])   . GERD (gastroesophageal reflux disease)   . High cholesterol   . Hyperlipidemia   . Hypertension   . Hypothyroidism   . OAB (overactive bladder)   . Osteoporosis   . Thyroid disease   . Type II diabetes mellitus [facility])     Patient Active Problem List   Diagnosis Date Noted  . Pressure injury of skin 12/09/2016  . SBO (small bowel obstruction) s/p lysis of adhesions 12/03/2016 12/04/2016  . Acute urinary retention 11/27/2016  .  Ascites 11/27/2016  . Abdominal distension   . Facial droop   . Hyperkalemia 11/25/2016  . Sepsis ([place]) 11/25/2016  . AKI (acute kidney injury) ([place]) 11/25/2016  . Delirium 11/25/2016  . Age-related osteoporosis without current pathological fracture 10/01/2016  . Hyperlipidemia 06/10/2016  . GERD (gastroesophageal reflux disease) 06/10/2016  . Insomnia 06/10/2016  . Viral gastroenteritis   . Right hip pain 05/06/2015  . Vaginal atrophy 11/01/2013  . ECZEMA 07/24/2008  . INTERMITTENT VERTIGO 07/24/2008  . Constipation 11/16/2007  . INSOMNIA 08/12/2007  . Essential hypertension 02/14/2007  . ALLERGIC RHINITIS 02/14/2007  . Hypothyroidism, acquired 01/26/2007  . Type 2 diabetes mellitus without complication, with long-term current use of insulin ([place]) 01/26/2007  . HYPERLIPIDEMIA 01/26/2007  . GERD 01/26/2007  . DEGENERATIVE JOINT DISEASE 01/26/2007    Past Surgical History:  Procedure Laterality Date  . ABDOMINAL HYSTERECTOMY    . KNEE ARTHROSCOPY    . LAPAROTOMY N/A 12/03/2016   Procedure: EXPLORATORY LAPAROTOMY, LYSIS OF ADHESIONS;  Surgeon: Armandina Gemma, MD;  Location: [facility] ORS;  Service: General;  Laterality: N/A;  . vocal cord polyps      OB History    Gravida Para Term Preterm AB Living   5 5 0 0 0     SAB TAB Ectopic Multiple Live Births   0 0 0           Home Medications    Prior to Admission  medications   Medication Sig Start Date End Date Taking? Authorizing Provider  aspirin EC 81 MG tablet Take 81 mg by mouth daily.   Yes [provider]  Calcium Carbonate-Vitamin D (CALCIUM-D PO) Take 2 tablets by mouth daily.    Yes [provider]  cholecalciferol (VITAMIN D) 1000 UNITS tablet Take 1,000 Units by mouth daily.   Yes [provider]  clindamycin (CLEOCIN) 2 % vaginal cream Place 1 Applicatorful vaginally at bedtime. 10/01/16  Yes Terrance Mass, MD  cyclobenzaprine (FLEXERIL) 5 MG tablet Take 5 mg by mouth 3 (three) times daily  as needed for muscle spasms.   Yes [provider]  estradiol (ESTRACE) 0.1 MG/GM vaginal cream Place 1 Applicatorful vaginally at bedtime. Mon and Thurs   Yes [provider]  feeding supplement, ENSURE ENLIVE, (ENSURE ENLIVE) LIQD Take 237 mLs by mouth 2 (two) times daily between meals. 12/09/16  Yes Robbie Lis, MD  insulin aspart (NOVOLOG) 100 UNIT/ML injection Inject 7-8 Units into the skin See admin instructions. Inject 7 units subcutaneously at breakfast and lunch and 8 units at supper   Yes [provider]  Insulin Detemir (LEVEMIR FLEXTOUCH) 100 UNIT/ML Pen Inject 8 Units into the skin 2 (two) times daily. 05/06/15  Yes Funches, Josalyn, MD  iron polysaccharides (NIFEREX) 150 MG capsule Take 150 mg by mouth 2 (two) times daily.    Yes [provider]  levothyroxine (SYNTHROID, LEVOTHROID) 100 MCG tablet Take 1 tablet (100 mcg total) by mouth daily. 09/25/14  Yes Advani, Vernon Prey, MD  loratadine (CLARITIN) 10 MG tablet Take 10 mg by mouth every other day.    Yes [provider]  losartan (COZAAR) 25 MG tablet Take 1 tablet (25 mg total) by mouth daily. 09/25/14  Yes Advani, Vernon Prey, MD  metFORMIN (GLUCOPHAGE) 1000 MG tablet Take 1,000 mg by mouth 2 (two) times daily with a meal.   Yes [provider]  Multiple Vitamin (MULTIVITAMIN) tablet Take 1 tablet by mouth daily.   Yes [provider]  nystatin (MYCOSTATIN) 100000 UNIT/ML suspension Take 5 mLs by mouth 2 (two) times daily. For mouth 10/12/14  Yes [provider]  Omega-3 Fatty Acids (FISH OIL PO) Take 1,000 mg by mouth 2 (two) times daily.    Yes [provider]  ondansetron (ZOFRAN ODT) 4 MG disintegrating tablet Take 1 tablet (4 mg total) by mouth every 8 (eight) hours as needed for nausea or vomiting. 11/25/16  Yes Sherwood Gambler, MD  pantoprazole (PROTONIX) 40 MG tablet Take 1 tablet (40 mg total) by mouth daily. 09/25/14  Yes Lorayne Marek, MD  polyethylene  glycol (MIRALAX / GLYCOLAX) packet Take 17 g by mouth every Monday, Wednesday, and Friday. Mix in 8 oz liquid and drink   Yes [provider]  simvastatin (ZOCOR) 40 MG tablet TAKE 1 TABLET BY MOUTH EVERY EVENING. NEED OFFICE VISIT FOR MORE REFILLS 08/12/15  Yes Funches, Josalyn, MD  traMADol (ULTRAM) 50 MG tablet Take 50 mg by mouth 4 (four) times daily.    Yes [provider]  acetaminophen (TYLENOL) 500 MG tablet Take 1 tablet (500 mg total) by mouth every 6 (six) hours as needed (pain). 11/30/16   Arrien, Jimmy Picket, MD  meclizine (ANTIVERT) 12.5 MG tablet Take 1 tablet (12.5 mg total) by mouth 3 (three) times daily as needed for dizziness. 11/13/14   Lacretia Leigh, MD  mometasone (NASONEX) 50 MCG/ACT nasal spray Place 2 sprays into the nose 2 (two) times daily  as needed (seasonal allergies).    [provider]  NONFORMULARY OR COMPOUNDED ITEM Estradiol 0.02 % 58ml prefilled applicator Sig: apply twice a week 01/20/16   Terrance Mass, MD    Family History Family History  Problem Relation Age of Onset  . Diabetes Mother   . Hypertension Mother   . Diabetes Sister   . Diabetes Brother     Social History Social History  Substance Use Topics  . Smoking status: Never Smoker  . Smokeless tobacco: Former Systems developer    Types: Chew    Quit date: 45  . Alcohol use No     Allergies   Lisinopril; Sulfa antibiotics; Sulfonamide derivatives; and Bactrim [sulfamethoxazole-trimethoprim]   Review of Systems Review of Systems  Constitutional: Negative for appetite change, chills, diaphoresis, fatigue and fever.  HENT: Negative for mouth sores, sore throat and trouble swallowing.   Eyes: Negative for visual disturbance.  Respiratory: Negative for cough, chest tightness, shortness of breath and wheezing.   Cardiovascular: Negative for chest pain.  Gastrointestinal: Positive for nausea and vomiting. Negative for abdominal distention, abdominal pain and diarrhea.    Endocrine: Negative for polydipsia, polyphagia and polyuria.       Hypoglycemia  Genitourinary: Negative for dysuria, frequency and hematuria.  Musculoskeletal: Negative for gait problem.  Skin: Negative for color change, pallor and rash.  Neurological: Negative for dizziness, syncope, light-headedness and headaches.  Hematological: Does not bruise/bleed easily.  Psychiatric/Behavioral: Positive for confusion. Negative for behavioral problems.     Physical Exam Updated Vital Signs BP (!) 175/78 (BP Location: Right Arm)   Pulse 86   Temp 98.3 F (36.8 C) (Oral)   Resp 20   Ht 5\' 7"  (1.702 m)   Wt 62.1 kg (137 lb)   SpO2 100%   BMI 21.46 kg/m   Physical Exam  Constitutional: She is oriented to person, place, and time. She appears well-developed and well-nourished. No distress.  Awake and alert.  HENT:  Head: Normocephalic.  Eyes: Conjunctivae are normal. Pupils are equal, round, and reactive to light. No scleral icterus.  Neck: Normal range of motion. Neck supple. No thyromegaly present.  Cardiovascular: Normal rate and regular rhythm.  Exam reveals no gallop and no friction rub.   No murmur heard. Pulmonary/Chest: Effort normal and breath sounds normal. No respiratory distress. She has no wheezes. She has no rales.  Abdominal: Soft. Bowel sounds are normal. She exhibits no distension. There is no tenderness. There is no rebound.  Midline incision appears well healing. Non tender. No erythema. No Tenderness. Normal active bowel sounds.  Musculoskeletal: Normal range of motion.  Neurological: She is alert and oriented to person, place, and time.  Alert, oriented.  Skin: Skin is warm and dry. No rash noted.  Psychiatric: She has a normal mood and affect. Her behavior is normal.     ED Treatments / Results  Labs (all labs ordered are listed, but only abnormal results are displayed) Labs Reviewed  URINALYSIS, ROUTINE W REFLEX MICROSCOPIC - Abnormal; Notable for the  following:       Result Value   APPearance HAZY (*)    Glucose, UA 50 (*)    Hgb urine dipstick SMALL (*)    Bacteria, UA RARE (*)    Squamous Epithelial / LPF 6-30 (*)    All other components within normal limits  CBC WITH DIFFERENTIAL/PLATELET - Abnormal; Notable for the following:    RBC 3.75 (*)    Hemoglobin 11.5 (*)    HCT  34.2 (*)    All other components within normal limits  COMPREHENSIVE METABOLIC PANEL - Abnormal; Notable for the following:    Sodium 131 (*)    Chloride 94 (*)    Glucose, Bld 223 (*)    Albumin 3.1 (*)    GFR calc non Af Amer 54 (*)    All other components within normal limits  CBG MONITORING, ED - Abnormal; Notable for the following:    Glucose-Capillary 19 (*)    All other components within normal limits  CBG MONITORING, ED - Abnormal; Notable for the following:    Glucose-Capillary 60 (*)    All other components within normal limits  CBG MONITORING, ED - Abnormal; Notable for the following:    Glucose-Capillary 147 (*)    All other components within normal limits  CBG MONITORING, ED - Abnormal; Notable for the following:    Glucose-Capillary 141 (*)    All other components within normal limits  CBG MONITORING, ED - Abnormal; Notable for the following:    Glucose-Capillary 230 (*)    All other components within normal limits  CBG MONITORING, ED - Abnormal; Notable for the following:    Glucose-Capillary 164 (*)    All other components within normal limits    EKG  EKG Interpretation None       Radiology No results found.  Procedures Procedures (including critical care time)  Medications Ordered in ED Medications  dextrose (GLUTOSE) 40 % oral gel 37.5 g (not administered)  dextrose 50 % solution 50 mL (50 mLs Intravenous Given 12/17/16 1644)     Initial Impression / Assessment and Plan / ED Course  I have reviewed the triage vital signs and the nursing notes.  Pertinent labs & imaging results that were available during my care  of the patient were reviewed by me and considered in my medical decision making (see chart for details).   patient has taken some by mouth juice and has no complaints of nausea and has a benign abdominal exam. I think her hypoglycemia is regular to her emesis and change in insulin dosage. We will follow blood sugars here for stability. Check urine electrolytes. Plan multiple re-evaluations.  Final Clinical Impressions(s) / ED Diagnoses   Final diagnoses:  Hypoglycemia   Blood sugars improved. No additional glucose required. Has eaten by mouth food here. Recheck to 50. I think she is appropriate for discharge home. She is not on any long acting oral hypoglycemics that would preclude her going home. I think her hypoglycemia was a result of poor by mouth intake with her episode of emesis and increased insulin dosage today. Of assess a hold her dosage for poor by mouth intake or vomiting, consider a sliding scale every before meals insulin, rather than standard dosing.   New Prescriptions New Prescriptions   No medications on file     Tanna Furry, MD 12/17/16 2034

## 2016-12-17 NOTE — ED Triage Notes (Signed)
PT RECEIVED FROM [facility] FOR HYPOGLYCEMIA. PER EMS, INITIAL CBG 44, INTA-GLUCOSE GIVEN, REPEAT 108. PT AAOX4. FAMILY STS THERE WAS A CHANGE MADE TO HER INSULIN DOSAGE.

## 2016-12-17 NOTE — ED Notes (Signed)
D/c pt going back by ptar to [facility] ( [place])  Paper work complete  Waiting ride back.

## 2016-12-17 NOTE — ED Notes (Signed)
cbg 230  jbf rn

## 2016-12-17 NOTE — ED Notes (Signed)
Dr Jeneen Rinks notified of patient's CBG.

## 2016-12-17 NOTE — ED Notes (Signed)
Pt hard of hearing, noted during assessment

## 2016-12-17 NOTE — ED Notes (Signed)
Bed: [facility] Expected date:  Expected time:  Means of arrival:  Comments: EMS hypoglycemia

## 2016-12-21 ENCOUNTER — Emergency Department (HOSPITAL_COMMUNITY): Payer: Medicare HMO

## 2016-12-21 ENCOUNTER — Emergency Department (HOSPITAL_COMMUNITY)
Admission: EM | Admit: 2016-12-21 | Discharge: 2016-12-21 | Disposition: A | Discharge status: Home / Self Care | Payer: Medicare HMO | Attending: Emergency Medicine | Admitting: Emergency Medicine

## 2016-12-21 ENCOUNTER — Encounter (HOSPITAL_COMMUNITY): Payer: Self-pay | Admitting: Emergency Medicine

## 2016-12-21 DIAGNOSIS — E1122 Type 2 diabetes mellitus with diabetic chronic kidney disease: Secondary | ICD-10-CM | POA: Insufficient documentation

## 2016-12-21 DIAGNOSIS — Z7982 Long term (current) use of aspirin: Secondary | ICD-10-CM | POA: Insufficient documentation

## 2016-12-21 DIAGNOSIS — Z794 Long term (current) use of insulin: Secondary | ICD-10-CM | POA: Insufficient documentation

## 2016-12-21 DIAGNOSIS — R5383 Other fatigue: Secondary | ICD-10-CM

## 2016-12-21 DIAGNOSIS — E039 Hypothyroidism, unspecified: Secondary | ICD-10-CM | POA: Insufficient documentation

## 2016-12-21 DIAGNOSIS — I129 Hypertensive chronic kidney disease with stage 1 through stage 4 chronic kidney disease, or unspecified chronic kidney disease: Secondary | ICD-10-CM | POA: Insufficient documentation

## 2016-12-21 DIAGNOSIS — R4182 Altered mental status, unspecified: Secondary | ICD-10-CM | POA: Insufficient documentation

## 2016-12-21 DIAGNOSIS — Z79899 Other long term (current) drug therapy: Secondary | ICD-10-CM | POA: Insufficient documentation

## 2016-12-21 DIAGNOSIS — R0902 Hypoxemia: Secondary | ICD-10-CM | POA: Diagnosis not present

## 2016-12-21 DIAGNOSIS — R9431 Abnormal electrocardiogram [ECG] [EKG]: Secondary | ICD-10-CM | POA: Diagnosis not present

## 2016-12-21 DIAGNOSIS — N189 Chronic kidney disease, unspecified: Secondary | ICD-10-CM | POA: Diagnosis not present

## 2016-12-21 LAB — URINALYSIS, ROUTINE W REFLEX MICROSCOPIC
BACTERIA UA: NONE SEEN
Bilirubin Urine: NEGATIVE
Glucose, UA: NEGATIVE mg/dL
Hgb urine dipstick: NEGATIVE
KETONES UR: NEGATIVE mg/dL
LEUKOCYTES UA: NEGATIVE
Nitrite: NEGATIVE
Protein, ur: NEGATIVE mg/dL
SPECIFIC GRAVITY, URINE: 1.004 — AB (ref 1.005–1.030)
SQUAMOUS EPITHELIAL / LPF: NONE SEEN
pH: 8 (ref 5.0–8.0)

## 2016-12-21 LAB — CBC WITH DIFFERENTIAL/PLATELET
BASOS ABS: 0 10*3/uL (ref 0.0–0.1)
Basophils Relative: 0 %
Eosinophils Absolute: 0.1 10*3/uL (ref 0.0–0.7)
Eosinophils Relative: 1 %
HEMATOCRIT: 30.4 % — AB (ref 36.0–46.0)
Hemoglobin: 10.3 g/dL — ABNORMAL LOW (ref 12.0–15.0)
LYMPHS PCT: 15 %
Lymphs Abs: 1.4 10*3/uL (ref 0.7–4.0)
MCH: 30.4 pg (ref 26.0–34.0)
MCHC: 33.9 g/dL (ref 30.0–36.0)
MCV: 89.7 fL (ref 78.0–100.0)
MONOS PCT: 7 %
Monocytes Absolute: 0.7 10*3/uL (ref 0.1–1.0)
NEUTROS ABS: 7.3 10*3/uL (ref 1.7–7.7)
Neutrophils Relative %: 77 %
Platelets: 362 10*3/uL (ref 150–400)
RBC: 3.39 MIL/uL — ABNORMAL LOW (ref 3.87–5.11)
RDW: 13.5 % (ref 11.5–15.5)
WBC: 9.5 10*3/uL (ref 4.0–10.5)

## 2016-12-21 LAB — COMPREHENSIVE METABOLIC PANEL
ALBUMIN: 3 g/dL — AB (ref 3.5–5.0)
ALT: 15 U/L (ref 14–54)
ANION GAP: 12 (ref 5–15)
AST: 26 U/L (ref 15–41)
Alkaline Phosphatase: 67 U/L (ref 38–126)
BUN: 13 mg/dL (ref 6–20)
CHLORIDE: 88 mmol/L — AB (ref 101–111)
CO2: 30 mmol/L (ref 22–32)
Calcium: 9.6 mg/dL (ref 8.9–10.3)
Creatinine, Ser: 0.94 mg/dL (ref 0.44–1.00)
GFR calc Af Amer: >60 mL/min (ref >60)
GFR calc non Af Amer: 56 mL/min — ABNORMAL LOW (ref >60)
GLUCOSE: 65 mg/dL (ref 65–99)
POTASSIUM: 4.3 mmol/L (ref 3.5–5.1)
SODIUM: 130 mmol/L — AB (ref 135–145)
TOTAL PROTEIN: 6.5 g/dL (ref 6.5–8.1)
Total Bilirubin: 0.3 mg/dL (ref 0.3–1.2)

## 2016-12-21 LAB — I-STAT CG4 LACTIC ACID, ED: LACTIC ACID, VENOUS: 2.2 mmol/L — AB (ref 0.5–1.9)

## 2016-12-21 LAB — CBG MONITORING, ED: Glucose-Capillary: 74 mg/dL (ref 65–99)

## 2016-12-21 MED ORDER — SODIUM CHLORIDE 0.9 % IV BOLUS (SEPSIS)
1000.0000 mL | Freq: Once | INTRAVENOUS | Status: AC
  Administered 2016-12-21: 1000 mL via INTRAVENOUS

## 2016-12-21 NOTE — ED Provider Notes (Signed)
[place] DEPT Provider Note   CSN: [id number] Arrival date & time: 12/21/16  1814     History   Chief Complaint Chief Complaint  Patient presents with  . Fatigue    HPI [name] [name] is a 83 y.o. female.  HPI   Pt with hx CKD, DM, HLD p/w fatigue that began today.  Found to be hypoxic - 85% on room air.    Level V caveat for acuity of condition, patient not responding to questioning.  When family arrived, family reports that patient was walking with a walker and talkative on Saturday.  Sunday the facility staff gave patient tramadol and pt became sleepy and less talkative.  This has continued to today.     Past Medical History:  Diagnosis Date  . Arthritis    "knees, legs" (06/10/2016)  . Ascites   . Chronic kidney disease    "related to my diabetes"  . Chronic lower back pain   . Diabetes mellitus without complication ([place])   . GERD (gastroesophageal reflux disease)   . High cholesterol   . Hyperlipidemia   . Hypertension   . Hypothyroidism   . OAB (overactive bladder)   . Osteoporosis   . Thyroid disease   . Type II diabetes mellitus [facility])     Patient Active Problem List   Diagnosis Date Noted  . Pressure injury of skin 12/09/2016  . SBO (small bowel obstruction) s/p lysis of adhesions 12/03/2016 12/04/2016  . Acute urinary retention 11/27/2016  . Ascites 11/27/2016  . Abdominal distension   . Facial droop   . Hyperkalemia 11/25/2016  . Sepsis ([place]) 11/25/2016  . AKI (acute kidney injury) ([place]) 11/25/2016  . Delirium 11/25/2016  . Age-related osteoporosis without current pathological fracture 10/01/2016  . Hyperlipidemia 06/10/2016  . GERD (gastroesophageal reflux disease) 06/10/2016  . Insomnia 06/10/2016  . Viral gastroenteritis   . Right hip pain 05/06/2015  . Vaginal atrophy 11/01/2013  . ECZEMA 07/24/2008  . INTERMITTENT VERTIGO 07/24/2008  . Constipation 11/16/2007  . INSOMNIA 08/12/2007  . Essential hypertension 02/14/2007  .  ALLERGIC RHINITIS 02/14/2007  . Hypothyroidism, acquired 01/26/2007  . Type 2 diabetes mellitus without complication, with long-term current use of insulin ([place]) 01/26/2007  . HYPERLIPIDEMIA 01/26/2007  . GERD 01/26/2007  . DEGENERATIVE JOINT DISEASE 01/26/2007    Past Surgical History:  Procedure Laterality Date  . ABDOMINAL HYSTERECTOMY    . KNEE ARTHROSCOPY    . LAPAROTOMY N/A 12/03/2016   Procedure: EXPLORATORY LAPAROTOMY, LYSIS OF ADHESIONS;  Surgeon: Armandina Gemma, MD;  Location: [facility] ORS;  Service: General;  Laterality: N/A;  . vocal cord polyps      OB History    Gravida Para Term Preterm AB Living   5 5 0 0 0     SAB TAB Ectopic Multiple Live Births   0 0 0           Home Medications    Prior to Admission medications   Medication Sig Start Date End Date Taking? Authorizing Provider  acetaminophen (TYLENOL) 500 MG tablet Take 500 mg by mouth every 6 (six) hours as needed.   Yes [provider]  aspirin EC 81 MG tablet Take 81 mg by mouth daily.   Yes [provider]  calcium-vitamin D 250-100 MG-UNIT tablet Take 2 tablets by mouth daily.   Yes [provider]  cholecalciferol (VITAMIN D) 1000 units tablet Take 1,000 Units by mouth daily.   Yes [provider]  clindamycin (CLEOCIN) 2 %  vaginal cream Place 1 Applicatorful vaginally at bedtime.   Yes [provider]  cyclobenzaprine (FLEXERIL) 5 MG tablet Take 5 mg by mouth 3 (three) times daily as needed for muscle spasms.   Yes [provider]  estradiol (ESTRACE) 0.1 MG/GM vaginal cream Place 1 Applicatorful vaginally 2 (two) times a week. Monday/ Thursday   Yes [provider]  insulin aspart (NOVOLOG) 100 UNIT/ML injection Inject 6-8 Units into the skin 3 (three) times daily before meals. SLIDING SCALE: 70-199= 6 UNITS 200-299= 7 UNITS >300= 8 UNITS   Yes [provider]  Insulin Detemir (LEVEMIR FLEXTOUCH) 100 UNIT/ML Pen Inject 8 Units into the  skin 2 (two) times daily.   Yes [provider]  iron polysaccharides (NIFEREX) 150 MG capsule Take 150 mg by mouth 2 (two) times daily.   Yes [provider]  levothyroxine (SYNTHROID, LEVOTHROID) 100 MCG tablet Take 100 mcg by mouth daily before breakfast.   Yes [provider]  loratadine (CLARITIN) 10 MG tablet Take 10 mg by mouth every other day.   Yes [provider]  losartan (COZAAR) 25 MG tablet Take 25 mg by mouth daily.   Yes [provider]  meclizine (ANTIVERT) 12.5 MG tablet Take 12.5 mg by mouth 3 (three) times daily as needed for dizziness.   Yes [provider]  mometasone (NASONEX) 50 MCG/ACT nasal spray Place 2 sprays into the nose daily as needed (allergies).   Yes [provider]  Multiple Vitamin (MULTIVITAMIN WITH MINERALS) TABS tablet Take 1 tablet by mouth daily.   Yes [provider]  nystatin (MYCOSTATIN) 100000 UNIT/ML suspension Take 5 mLs by mouth 2 (two) times daily.   Yes [provider]  Omega-3 Fatty Acids (FISH OIL) 1000 MG CAPS Take 1,000 mg by mouth 2 (two) times daily.   Yes [provider]  ondansetron (ZOFRAN) 4 MG tablet Take 4 mg by mouth every 8 (eight) hours as needed for nausea or vomiting.   Yes [provider]  pantoprazole (PROTONIX) 40 MG tablet Take 40 mg by mouth daily.   Yes [provider]  polyethylene glycol (MIRALAX / GLYCOLAX) packet Take 17 g by mouth every Monday, Wednesday, and Friday.   Yes [provider]  simvastatin (ZOCOR) 40 MG tablet Take 40 mg by mouth daily.   Yes [provider]    Family History Family History  Problem Relation Age of Onset  . Diabetes Mother   . Hypertension Mother   . Diabetes Sister   . Diabetes Brother     Social History Social History  Substance Use Topics  . Smoking status: Never Smoker  . Smokeless tobacco: Former Systems developer    Types: Chew    Quit date: 71  . Alcohol use  No     Allergies   Lisinopril; Sulfa antibiotics; Sulfonamide derivatives; and Bactrim [sulfamethoxazole-trimethoprim]   Review of Systems Review of Systems  Unable to perform ROS: Acuity of condition     Physical Exam Updated Vital Signs BP (!) 141/92 (BP Location: Right Arm)   Pulse 77   Temp 98.6 F (37 C) (Oral)   Resp 18   Ht 5\' 7"  (1.702 m)   Wt 62.1 kg (137 lb)   SpO2 93%   BMI 21.46 kg/m   Physical Exam  Constitutional: She appears well-developed and well-nourished. No distress.  HENT:  Head: Normocephalic and atraumatic.  Neck: Neck supple.  Cardiovascular: Normal rate and regular rhythm.   Pulmonary/Chest: Effort normal.  No respiratory distress. She has no wheezes. She has rales.  Abdominal: Soft. She exhibits no distension. There is no tenderness. There is no rebound and no guarding.  Musculoskeletal: She exhibits no edema.  Neurological: She is alert.  Pt does not respond to orientation questions.  She is able to provider her birthdate.   Skin: She is not diaphoretic.  Nursing note and vitals reviewed.    ED Treatments / Results  Labs (all labs ordered are listed, but only abnormal results are displayed) Labs Reviewed  COMPREHENSIVE METABOLIC PANEL - Abnormal; Notable for the following:       Result Value   Sodium 130 (*)    Chloride 88 (*)    Albumin 3.0 (*)    GFR calc non Af Amer 56 (*)    All other components within normal limits  CBC WITH DIFFERENTIAL/PLATELET - Abnormal; Notable for the following:    RBC 3.39 (*)    Hemoglobin 10.3 (*)    HCT 30.4 (*)    All other components within normal limits  URINALYSIS, ROUTINE W REFLEX MICROSCOPIC - Abnormal; Notable for the following:    Color, Urine STRAW (*)    Specific Gravity, Urine 1.004 (*)    All other components within normal limits  I-STAT CG4 LACTIC ACID, ED - Abnormal; Notable for the following:    Lactic Acid, Venous 2.20 (*)    All other components within normal limits  CBG  MONITORING, ED  I-STAT CG4 LACTIC ACID, ED    EKG  EKG Interpretation  Date/Time:  Monday December 21 2016 18:30:51 EDT Ventricular Rate:  76 PR Interval:    QRS Duration: 86 QT Interval:  395 QTC Calculation: 445 R Axis:   51 Text Interpretation:  Sinus rhythm Borderline low voltage, extremity leads peaked T waves on previous tracing have improved Confirmed by Theotis Burrow [phone]) on 12/21/2016 7:15:36 PM       Radiology Dg Chest 2 View  Result Date: 12/21/2016 CLINICAL DATA:  Hypoxia EXAM: CHEST  2 VIEW COMPARISON:  11/25/2016 FINDINGS: There is no focal parenchymal opacity. There is no pleural effusion or pneumothorax. There is right shoulder calcific tendinosis versus calcific bursitis. The osseous structures are unremarkable. IMPRESSION: No active cardiopulmonary disease. Electronically Signed   By: Kathreen Devoid   On: 12/21/2016 19:29   Ct Head Wo Contrast  Result Date: 12/21/2016 CLINICAL DATA:  Fatigue, altered mental status EXAM: CT HEAD WITHOUT CONTRAST TECHNIQUE: Contiguous axial images were obtained from the base of the skull through the vertex without intravenous contrast. COMPARISON:  11/27/2016, 11/25/2016 FINDINGS: Brain: No acute territorial infarction, hemorrhage or intracranial mass. Stable slightly enlarged ventricles. Stable periventricular and deep white matter small vessel changes. No midline shift. Vascular: No hyperdense vessels.  Carotid artery calcifications. Skull: No fracture or suspicious bone lesion. Sinuses/Orbits: No acute finding. Other: None IMPRESSION: Stable CT examination of the brain. There are no acute abnormalities visualized. Electronically Signed   By: Donavan Foil M.D.   On: 12/21/2016 19:34    Procedures Procedures (including critical care time)  Medications Ordered in ED Medications  sodium chloride 0.9 % bolus 1,000 mL (1,000 mLs Intravenous New Bag/Given 12/21/16 2045)     Initial Impression / Assessment and Plan / ED Course  I have  reviewed the triage vital signs and the nursing notes.  Pertinent labs & imaging results that were available during my care of the patient were reviewed by me and considered in my medical decision making (see chart for  details).  Clinical Course as of Dec 22 2314  Mon Dec 21, 2016  2125 O2 is 100%.  Per family pt was given tramadol at facility yesterday and this is when she became less communicative.    [EW]  2209 Social work speaking with family   [EW]    Clinical Course User Index [EW] [place], [place]    Pt sent in from facility for decreased interaction, generally fatigued.  Labs c/w what appears to be somewhat chronic dehydration.  Pt improved with IVF and time in the department.  She became more interactive.  Pt's family concerned about the care she is receiving at the facility, was seen in ED by social worker Roderic Palau.  Pt will be discharged home tonight and will go back to the facility but paperwork is in place for patient to possibly change to a new facility.  Family will follow up with social worker at hospital tomorrow.  Discussed result, findings, treatment, and follow up  with patient.  Pt given return precautions.  Pt verbalizes understanding and agrees with plan.      Final Clinical Impressions(s) / ED Diagnoses   Final diagnoses:  Fatigue, unspecified type    New Prescriptions New Prescriptions   No medications on file     Clayton Bibles, [place] 12/21/16 [place], Wenda Overland, MD 12/24/16 [phone]

## 2016-12-21 NOTE — ED Triage Notes (Addendum)
Pt from [facility] sent out for worsening fatigue throughout the day; pt baseline "but interaction different." Blood like dried substance noted on lips staff verbalize is liquid vitamin. Pt had scar tissue from previous hysterectomy removed 5/17.
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
## 2016-12-21 NOTE — Discharge Instructions (Signed)
Read the information below.  You may return to the Emergency Department at any time for worsening condition or any new symptoms that concern you.  Please stop taking the Tramadol as it seems to be too sedating. I encourage you to eat and drink well, including several small meals throughout the day.  Follow closely with your primary care provider.

## 2016-12-21 NOTE — ED Notes (Signed)
Unable to obtain from either IV start.  Will notify EDP.

## 2016-12-21 NOTE — NC FL2 (Signed)
[place] LEVEL OF CARE SCREENING TOOL     IDENTIFICATION  Patient Name: [name] [name] Birthdate: October 04, 1933 Sex: female Admission Date (Current Location): 12/21/2016  [facility] and [place] Number:  Herbalist and Address:  [facility],  [place] [address] [place]      Provider Number: [id number]  Attending Physician Name and Address:  Rex Kras Wenda Overland, MD  Relative Name and Phone Number:       Current Level of Care: Hospital Recommended Level of Care: [place] Prior Approval Number:    Date Approved/Denied:   PASRR Number: [id number]  Discharge Plan: SNF    Current Diagnoses: Patient Active Problem List   Diagnosis Date Noted  . Pressure injury of skin 12/09/2016  . SBO (small bowel obstruction) s/p lysis of adhesions 12/03/2016 12/04/2016  . Acute urinary retention 11/27/2016  . Ascites 11/27/2016  . Abdominal distension   . Facial droop   . Hyperkalemia 11/25/2016  . Sepsis ([place]) 11/25/2016  . AKI (acute kidney injury) ([place]) 11/25/2016  . Delirium 11/25/2016  . Age-related osteoporosis without current pathological fracture 10/01/2016  . Hyperlipidemia 06/10/2016  . GERD (gastroesophageal reflux disease) 06/10/2016  . Insomnia 06/10/2016  . Viral gastroenteritis   . Right hip pain 05/06/2015  . Vaginal atrophy 11/01/2013  . ECZEMA 07/24/2008  . INTERMITTENT VERTIGO 07/24/2008  . Constipation 11/16/2007  . INSOMNIA 08/12/2007  . Essential hypertension 02/14/2007  . ALLERGIC RHINITIS 02/14/2007  . Hypothyroidism, acquired 01/26/2007  . Type 2 diabetes mellitus without complication, with long-term current use of insulin ([place]) 01/26/2007  . HYPERLIPIDEMIA 01/26/2007  . GERD 01/26/2007  . DEGENERATIVE JOINT DISEASE 01/26/2007    Orientation RESPIRATION BLADDER Height & Weight     Self, Time, Situation, Place  Normal Continent (Indwelling catheter) Weight: 137 lb (62.1 kg) Height:  5\' 7"   (170.2 cm)  BEHAVIORAL SYMPTOMS/MOOD NEUROLOGICAL BOWEL NUTRITION STATUS      Continent Diet ( pt is a diabetic and takes insulin)  AMBULATORY STATUS COMMUNICATION OF NEEDS Skin   Extensive Assist Verbally Normal                       Personal Care Assistance Level of Assistance  Bathing, Dressing Bathing Assistance: Limited assistance Feeding assistance: Limited Assistance Dressing Assistance: Limited assistance     Functional Limitations Info  Hearing (Slightly hard of hearing, but can converse) Sight Info: Adequate Hearing Info: Adequate Speech Info: Adequate    SPECIAL CARE FACTORS FREQUENCY        PT Frequency: 5 OT Frequency: 5            Contractures Contractures Info: Not present    Additional Factors Info  Allergies, Code Status Code Status Info: Prior Allergies Info: Lisinopril, Sulfa Antibiotics, Sulfonamide Derivatives, Bactrim Sulfamethoxazole-trimethoprim           Current Medications (12/21/2016):  This is the current hospital active medication list No current facility-administered medications for this encounter.    Current Outpatient Prescriptions  Medication Sig Dispense Refill  . acetaminophen (TYLENOL) 500 MG tablet Take 500 mg by mouth every 6 (six) hours as needed.    Marland Kitchen aspirin EC 81 MG tablet Take 81 mg by mouth daily.    . calcium-vitamin D 250-100 MG-UNIT tablet Take 2 tablets by mouth daily.    . cholecalciferol (VITAMIN D) 1000 units tablet Take 1,000 Units by mouth daily.    . clindamycin (CLEOCIN) 2 % vaginal cream Place 1  Applicatorful vaginally at bedtime.    . cyclobenzaprine (FLEXERIL) 5 MG tablet Take 5 mg by mouth 3 (three) times daily as needed for muscle spasms.    Marland Kitchen estradiol (ESTRACE) 0.1 MG/GM vaginal cream Place 1 Applicatorful vaginally 2 (two) times a week. Monday/ Thursday    . insulin aspart (NOVOLOG) 100 UNIT/ML injection Inject 6-8 Units into the skin 3 (three) times daily before meals. SLIDING SCALE: 70-199= 6  UNITS 200-299= 7 UNITS >300= 8 UNITS    . Insulin Detemir (LEVEMIR FLEXTOUCH) 100 UNIT/ML Pen Inject 8 Units into the skin 2 (two) times daily.    . iron polysaccharides (NIFEREX) 150 MG capsule Take 150 mg by mouth 2 (two) times daily.    Marland Kitchen levothyroxine (SYNTHROID, LEVOTHROID) 100 MCG tablet Take 100 mcg by mouth daily before breakfast.    . loratadine (CLARITIN) 10 MG tablet Take 10 mg by mouth every other day.    . losartan (COZAAR) 25 MG tablet Take 25 mg by mouth daily.    . meclizine (ANTIVERT) 12.5 MG tablet Take 12.5 mg by mouth 3 (three) times daily as needed for dizziness.    . mometasone (NASONEX) 50 MCG/ACT nasal spray Place 2 sprays into the nose daily as needed (allergies).    . Multiple Vitamin (MULTIVITAMIN WITH MINERALS) TABS tablet Take 1 tablet by mouth daily.    Marland Kitchen nystatin (MYCOSTATIN) 100000 UNIT/ML suspension Take 5 mLs by mouth 2 (two) times daily.    . Omega-3 Fatty Acids (FISH OIL) 1000 MG CAPS Take 1,000 mg by mouth 2 (two) times daily.    . ondansetron (ZOFRAN) 4 MG tablet Take 4 mg by mouth every 8 (eight) hours as needed for nausea or vomiting.    . pantoprazole (PROTONIX) 40 MG tablet Take 40 mg by mouth daily.    . polyethylene glycol (MIRALAX / GLYCOLAX) packet Take 17 g by mouth every Monday, Wednesday, and Friday.    . simvastatin (ZOCOR) 40 MG tablet Take 40 mg by mouth daily.       Discharge Medications: Please see discharge summary for a list of discharge medications.  Relevant Imaging Results:  Relevant Lab Results:   Additional Information [id number]  Alphonse Guild Ahleah Simko, LCSWA

## 2016-12-21 NOTE — Progress Notes (Addendum)
CSW met with family who stated they prefer pt be placed elsewhere due to concerns over their mother's care at [facility].  CSW informed family pt cannot be placed from ED and stay overnight, per MD and does not meet inpatient criteria.  CSW stated he was unsure if PT recommendation from last admission will still stand but offered to create  FL-2 and send out referrals to SNF in [place], [place] and [place].  Pt's family lives in [place] and appreciated CSW's efforts and agreed to allow the return of the pt to [facility] once D/C'd on 12/21/16.    CSW stated daytime CSW would speak with them about their preferences on 12/22/16 once facilities who accept [facility] had responded and accepted the patient and give their phone numbers to the facilities of their choice.  CSW will provide pt's daughters with SNF list that shows facilities that accept [employer], they will familiarize themselves with it and be prepared to speak to the daytime ED CSW on 12/22/16 to see if and what SNF's have bed offers available.  Pt's daughters [place],[name] at [phone] and [name] at ph: [phone] understand that the former PT recommendation may not be useable but that the CSW was going to send referrals just in case.  CSW also advised daughters their choices may be limited due to the [facility], as well. Daughters were appreciative and thanked the CSW. CSW will leave handoff for daytime CSW.  Alphonse Guild. Willa Brocks, Latanya Presser, LCAS Clinical Social Worker Ph: [phone]

## 2016-12-21 NOTE — ED Notes (Signed)
Called lab to get blood work in process of getting

## 2016-12-21 NOTE — ED Notes (Signed)
Unsuccessful blood draw x2 

## 2016-12-22 DIAGNOSIS — Z794 Long term (current) use of insulin: Secondary | ICD-10-CM | POA: Diagnosis not present

## 2016-12-22 DIAGNOSIS — E1165 Type 2 diabetes mellitus with hyperglycemia: Secondary | ICD-10-CM | POA: Diagnosis not present

## 2016-12-22 DIAGNOSIS — E1142 Type 2 diabetes mellitus with diabetic polyneuropathy: Secondary | ICD-10-CM | POA: Diagnosis not present

## 2016-12-22 DIAGNOSIS — Z5181 Encounter for therapeutic drug level monitoring: Secondary | ICD-10-CM | POA: Diagnosis not present

## 2016-12-23 DIAGNOSIS — N182 Chronic kidney disease, stage 2 (mild): Secondary | ICD-10-CM | POA: Diagnosis not present

## 2016-12-23 DIAGNOSIS — Z794 Long term (current) use of insulin: Secondary | ICD-10-CM | POA: Diagnosis not present

## 2016-12-23 DIAGNOSIS — I10 Essential (primary) hypertension: Secondary | ICD-10-CM | POA: Diagnosis not present

## 2016-12-23 DIAGNOSIS — E1142 Type 2 diabetes mellitus with diabetic polyneuropathy: Secondary | ICD-10-CM | POA: Diagnosis not present

## 2016-12-23 DIAGNOSIS — H9193 Unspecified hearing loss, bilateral: Secondary | ICD-10-CM | POA: Diagnosis not present

## 2016-12-23 DIAGNOSIS — K56609 Unspecified intestinal obstruction, unspecified as to partial versus complete obstruction: Secondary | ICD-10-CM | POA: Diagnosis not present

## 2017-01-01 DIAGNOSIS — L89312 Pressure ulcer of right buttock, stage 2: Secondary | ICD-10-CM | POA: Diagnosis not present

## 2017-01-01 DIAGNOSIS — N189 Chronic kidney disease, unspecified: Secondary | ICD-10-CM | POA: Diagnosis not present

## 2017-01-01 DIAGNOSIS — L89322 Pressure ulcer of left buttock, stage 2: Secondary | ICD-10-CM | POA: Diagnosis not present

## 2017-01-01 DIAGNOSIS — I129 Hypertensive chronic kidney disease with stage 1 through stage 4 chronic kidney disease, or unspecified chronic kidney disease: Secondary | ICD-10-CM | POA: Diagnosis not present

## 2017-01-01 DIAGNOSIS — R2689 Other abnormalities of gait and mobility: Secondary | ICD-10-CM | POA: Diagnosis not present

## 2017-01-01 DIAGNOSIS — E1122 Type 2 diabetes mellitus with diabetic chronic kidney disease: Secondary | ICD-10-CM | POA: Diagnosis not present

## 2017-01-01 DIAGNOSIS — G8929 Other chronic pain: Secondary | ICD-10-CM | POA: Diagnosis not present

## 2017-01-01 DIAGNOSIS — Z48815 Encounter for surgical aftercare following surgery on the digestive system: Secondary | ICD-10-CM | POA: Diagnosis not present

## 2017-01-04 DIAGNOSIS — Z48815 Encounter for surgical aftercare following surgery on the digestive system: Secondary | ICD-10-CM | POA: Diagnosis not present

## 2017-01-04 DIAGNOSIS — G8929 Other chronic pain: Secondary | ICD-10-CM | POA: Diagnosis not present

## 2017-01-04 DIAGNOSIS — L89312 Pressure ulcer of right buttock, stage 2: Secondary | ICD-10-CM | POA: Diagnosis not present

## 2017-01-04 DIAGNOSIS — N189 Chronic kidney disease, unspecified: Secondary | ICD-10-CM | POA: Diagnosis not present

## 2017-01-04 DIAGNOSIS — R2689 Other abnormalities of gait and mobility: Secondary | ICD-10-CM | POA: Diagnosis not present

## 2017-01-04 DIAGNOSIS — L89322 Pressure ulcer of left buttock, stage 2: Secondary | ICD-10-CM | POA: Diagnosis not present

## 2017-01-04 DIAGNOSIS — I129 Hypertensive chronic kidney disease with stage 1 through stage 4 chronic kidney disease, or unspecified chronic kidney disease: Secondary | ICD-10-CM | POA: Diagnosis not present

## 2017-01-04 DIAGNOSIS — E1122 Type 2 diabetes mellitus with diabetic chronic kidney disease: Secondary | ICD-10-CM | POA: Diagnosis not present

## 2017-01-07 DIAGNOSIS — I129 Hypertensive chronic kidney disease with stage 1 through stage 4 chronic kidney disease, or unspecified chronic kidney disease: Secondary | ICD-10-CM | POA: Diagnosis not present

## 2017-01-07 DIAGNOSIS — L89312 Pressure ulcer of right buttock, stage 2: Secondary | ICD-10-CM | POA: Diagnosis not present

## 2017-01-07 DIAGNOSIS — Z48815 Encounter for surgical aftercare following surgery on the digestive system: Secondary | ICD-10-CM | POA: Diagnosis not present

## 2017-01-07 DIAGNOSIS — N189 Chronic kidney disease, unspecified: Secondary | ICD-10-CM | POA: Diagnosis not present

## 2017-01-07 DIAGNOSIS — G8929 Other chronic pain: Secondary | ICD-10-CM | POA: Diagnosis not present

## 2017-01-07 DIAGNOSIS — E1122 Type 2 diabetes mellitus with diabetic chronic kidney disease: Secondary | ICD-10-CM | POA: Diagnosis not present

## 2017-01-07 DIAGNOSIS — R2689 Other abnormalities of gait and mobility: Secondary | ICD-10-CM | POA: Diagnosis not present

## 2017-01-07 DIAGNOSIS — L89322 Pressure ulcer of left buttock, stage 2: Secondary | ICD-10-CM | POA: Diagnosis not present

## 2017-01-12 DIAGNOSIS — N189 Chronic kidney disease, unspecified: Secondary | ICD-10-CM | POA: Diagnosis not present

## 2017-01-12 DIAGNOSIS — L89322 Pressure ulcer of left buttock, stage 2: Secondary | ICD-10-CM | POA: Diagnosis not present

## 2017-01-12 DIAGNOSIS — E1122 Type 2 diabetes mellitus with diabetic chronic kidney disease: Secondary | ICD-10-CM | POA: Diagnosis not present

## 2017-01-12 DIAGNOSIS — Z48815 Encounter for surgical aftercare following surgery on the digestive system: Secondary | ICD-10-CM | POA: Diagnosis not present

## 2017-01-12 DIAGNOSIS — L89312 Pressure ulcer of right buttock, stage 2: Secondary | ICD-10-CM | POA: Diagnosis not present

## 2017-01-12 DIAGNOSIS — G8929 Other chronic pain: Secondary | ICD-10-CM | POA: Diagnosis not present

## 2017-01-12 DIAGNOSIS — R2689 Other abnormalities of gait and mobility: Secondary | ICD-10-CM | POA: Diagnosis not present

## 2017-01-12 DIAGNOSIS — I129 Hypertensive chronic kidney disease with stage 1 through stage 4 chronic kidney disease, or unspecified chronic kidney disease: Secondary | ICD-10-CM | POA: Diagnosis not present

## 2017-01-14 DIAGNOSIS — E1122 Type 2 diabetes mellitus with diabetic chronic kidney disease: Secondary | ICD-10-CM | POA: Diagnosis not present

## 2017-01-14 DIAGNOSIS — N189 Chronic kidney disease, unspecified: Secondary | ICD-10-CM | POA: Diagnosis not present

## 2017-01-14 DIAGNOSIS — L89322 Pressure ulcer of left buttock, stage 2: Secondary | ICD-10-CM | POA: Diagnosis not present

## 2017-01-14 DIAGNOSIS — L89312 Pressure ulcer of right buttock, stage 2: Secondary | ICD-10-CM | POA: Diagnosis not present

## 2017-01-14 DIAGNOSIS — Z48815 Encounter for surgical aftercare following surgery on the digestive system: Secondary | ICD-10-CM | POA: Diagnosis not present

## 2017-01-14 DIAGNOSIS — G8929 Other chronic pain: Secondary | ICD-10-CM | POA: Diagnosis not present

## 2017-01-14 DIAGNOSIS — I129 Hypertensive chronic kidney disease with stage 1 through stage 4 chronic kidney disease, or unspecified chronic kidney disease: Secondary | ICD-10-CM | POA: Diagnosis not present

## 2017-01-14 DIAGNOSIS — R2689 Other abnormalities of gait and mobility: Secondary | ICD-10-CM | POA: Diagnosis not present

## 2017-01-19 DIAGNOSIS — S91302A Unspecified open wound, left foot, initial encounter: Secondary | ICD-10-CM | POA: Diagnosis not present

## 2017-01-20 DIAGNOSIS — I129 Hypertensive chronic kidney disease with stage 1 through stage 4 chronic kidney disease, or unspecified chronic kidney disease: Secondary | ICD-10-CM | POA: Diagnosis not present

## 2017-01-20 DIAGNOSIS — N189 Chronic kidney disease, unspecified: Secondary | ICD-10-CM | POA: Diagnosis not present

## 2017-01-20 DIAGNOSIS — E1122 Type 2 diabetes mellitus with diabetic chronic kidney disease: Secondary | ICD-10-CM | POA: Diagnosis not present

## 2017-01-20 DIAGNOSIS — Z48815 Encounter for surgical aftercare following surgery on the digestive system: Secondary | ICD-10-CM | POA: Diagnosis not present

## 2017-01-20 DIAGNOSIS — L89312 Pressure ulcer of right buttock, stage 2: Secondary | ICD-10-CM | POA: Diagnosis not present

## 2017-01-20 DIAGNOSIS — G8929 Other chronic pain: Secondary | ICD-10-CM | POA: Diagnosis not present

## 2017-01-20 DIAGNOSIS — L89322 Pressure ulcer of left buttock, stage 2: Secondary | ICD-10-CM | POA: Diagnosis not present

## 2017-01-20 DIAGNOSIS — R2689 Other abnormalities of gait and mobility: Secondary | ICD-10-CM | POA: Diagnosis not present

## 2017-01-21 DIAGNOSIS — G8929 Other chronic pain: Secondary | ICD-10-CM | POA: Diagnosis not present

## 2017-01-21 DIAGNOSIS — E1122 Type 2 diabetes mellitus with diabetic chronic kidney disease: Secondary | ICD-10-CM | POA: Diagnosis not present

## 2017-01-21 DIAGNOSIS — Z48815 Encounter for surgical aftercare following surgery on the digestive system: Secondary | ICD-10-CM | POA: Diagnosis not present

## 2017-01-21 DIAGNOSIS — I129 Hypertensive chronic kidney disease with stage 1 through stage 4 chronic kidney disease, or unspecified chronic kidney disease: Secondary | ICD-10-CM | POA: Diagnosis not present

## 2017-01-21 DIAGNOSIS — N189 Chronic kidney disease, unspecified: Secondary | ICD-10-CM | POA: Diagnosis not present

## 2017-01-21 DIAGNOSIS — L89312 Pressure ulcer of right buttock, stage 2: Secondary | ICD-10-CM | POA: Diagnosis not present

## 2017-01-21 DIAGNOSIS — L89322 Pressure ulcer of left buttock, stage 2: Secondary | ICD-10-CM | POA: Diagnosis not present

## 2017-01-21 DIAGNOSIS — R2689 Other abnormalities of gait and mobility: Secondary | ICD-10-CM | POA: Diagnosis not present

## 2017-01-22 DIAGNOSIS — Z794 Long term (current) use of insulin: Secondary | ICD-10-CM | POA: Diagnosis not present

## 2017-01-22 DIAGNOSIS — E1165 Type 2 diabetes mellitus with hyperglycemia: Secondary | ICD-10-CM | POA: Diagnosis not present

## 2017-01-22 DIAGNOSIS — Z79899 Other long term (current) drug therapy: Secondary | ICD-10-CM | POA: Diagnosis not present

## 2017-01-22 DIAGNOSIS — E1142 Type 2 diabetes mellitus with diabetic polyneuropathy: Secondary | ICD-10-CM | POA: Diagnosis not present

## 2017-01-22 DIAGNOSIS — E1140 Type 2 diabetes mellitus with diabetic neuropathy, unspecified: Secondary | ICD-10-CM | POA: Diagnosis not present

## 2017-01-23 DIAGNOSIS — R251 Tremor, unspecified: Secondary | ICD-10-CM | POA: Diagnosis not present

## 2017-01-23 DIAGNOSIS — Z0000 Encounter for general adult medical examination without abnormal findings: Secondary | ICD-10-CM | POA: Diagnosis not present

## 2017-01-23 DIAGNOSIS — Z6820 Body mass index (BMI) 20.0-20.9, adult: Secondary | ICD-10-CM | POA: Diagnosis not present

## 2017-01-23 DIAGNOSIS — N329 Bladder disorder, unspecified: Secondary | ICD-10-CM | POA: Diagnosis not present

## 2017-01-23 DIAGNOSIS — K219 Gastro-esophageal reflux disease without esophagitis: Secondary | ICD-10-CM | POA: Diagnosis not present

## 2017-01-23 DIAGNOSIS — M810 Age-related osteoporosis without current pathological fracture: Secondary | ICD-10-CM | POA: Diagnosis not present

## 2017-01-23 DIAGNOSIS — H9110 Presbycusis, unspecified ear: Secondary | ICD-10-CM | POA: Diagnosis not present

## 2017-01-23 DIAGNOSIS — Z794 Long term (current) use of insulin: Secondary | ICD-10-CM | POA: Diagnosis not present

## 2017-01-23 DIAGNOSIS — M1990 Unspecified osteoarthritis, unspecified site: Secondary | ICD-10-CM | POA: Diagnosis not present

## 2017-01-23 DIAGNOSIS — J301 Allergic rhinitis due to pollen: Secondary | ICD-10-CM | POA: Diagnosis not present

## 2017-01-23 DIAGNOSIS — G3184 Mild cognitive impairment, so stated: Secondary | ICD-10-CM | POA: Diagnosis not present

## 2017-01-23 DIAGNOSIS — G9589 Other specified diseases of spinal cord: Secondary | ICD-10-CM | POA: Diagnosis not present

## 2017-01-23 DIAGNOSIS — E119 Type 2 diabetes mellitus without complications: Secondary | ICD-10-CM | POA: Diagnosis not present

## 2017-01-23 DIAGNOSIS — Z7982 Long term (current) use of aspirin: Secondary | ICD-10-CM | POA: Diagnosis not present

## 2017-01-23 DIAGNOSIS — E039 Hypothyroidism, unspecified: Secondary | ICD-10-CM | POA: Diagnosis not present

## 2017-01-23 DIAGNOSIS — D509 Iron deficiency anemia, unspecified: Secondary | ICD-10-CM | POA: Diagnosis not present

## 2017-01-23 DIAGNOSIS — I10 Essential (primary) hypertension: Secondary | ICD-10-CM | POA: Diagnosis not present

## 2017-01-26 DIAGNOSIS — M792 Neuralgia and neuritis, unspecified: Secondary | ICD-10-CM | POA: Diagnosis not present

## 2017-01-26 DIAGNOSIS — M71572 Other bursitis, not elsewhere classified, left ankle and foot: Secondary | ICD-10-CM | POA: Diagnosis not present

## 2017-01-26 DIAGNOSIS — L97521 Non-pressure chronic ulcer of other part of left foot limited to breakdown of skin: Secondary | ICD-10-CM | POA: Diagnosis not present

## 2017-01-27 ENCOUNTER — Ambulatory Visit
Admission: RE | Admit: 2017-01-27 | Discharge: 2017-01-27 | Disposition: A | Discharge status: Home / Self Care | Payer: Medicare HMO | Source: Ambulatory Visit | Attending: Family Medicine | Admitting: Family Medicine

## 2017-01-27 ENCOUNTER — Other Ambulatory Visit: Payer: Self-pay | Admitting: Family Medicine

## 2017-01-27 DIAGNOSIS — M5442 Lumbago with sciatica, left side: Secondary | ICD-10-CM | POA: Diagnosis not present

## 2017-01-27 DIAGNOSIS — L89312 Pressure ulcer of right buttock, stage 2: Secondary | ICD-10-CM | POA: Diagnosis not present

## 2017-01-27 DIAGNOSIS — E1122 Type 2 diabetes mellitus with diabetic chronic kidney disease: Secondary | ICD-10-CM | POA: Diagnosis not present

## 2017-01-27 DIAGNOSIS — I129 Hypertensive chronic kidney disease with stage 1 through stage 4 chronic kidney disease, or unspecified chronic kidney disease: Secondary | ICD-10-CM | POA: Diagnosis not present

## 2017-01-27 DIAGNOSIS — M545 Low back pain: Secondary | ICD-10-CM

## 2017-01-27 DIAGNOSIS — Z48815 Encounter for surgical aftercare following surgery on the digestive system: Secondary | ICD-10-CM | POA: Diagnosis not present

## 2017-01-27 DIAGNOSIS — L89322 Pressure ulcer of left buttock, stage 2: Secondary | ICD-10-CM | POA: Diagnosis not present

## 2017-01-27 DIAGNOSIS — N189 Chronic kidney disease, unspecified: Secondary | ICD-10-CM | POA: Diagnosis not present

## 2017-01-27 DIAGNOSIS — G8929 Other chronic pain: Secondary | ICD-10-CM | POA: Diagnosis not present

## 2017-01-27 DIAGNOSIS — R140 Abdominal distension (gaseous): Secondary | ICD-10-CM | POA: Diagnosis not present

## 2017-01-27 DIAGNOSIS — M47816 Spondylosis without myelopathy or radiculopathy, lumbar region: Secondary | ICD-10-CM | POA: Diagnosis not present

## 2017-01-27 DIAGNOSIS — R2689 Other abnormalities of gait and mobility: Secondary | ICD-10-CM | POA: Diagnosis not present

## 2017-01-28 DIAGNOSIS — N189 Chronic kidney disease, unspecified: Secondary | ICD-10-CM | POA: Diagnosis not present

## 2017-01-28 DIAGNOSIS — L89312 Pressure ulcer of right buttock, stage 2: Secondary | ICD-10-CM | POA: Diagnosis not present

## 2017-01-28 DIAGNOSIS — Z48815 Encounter for surgical aftercare following surgery on the digestive system: Secondary | ICD-10-CM | POA: Diagnosis not present

## 2017-01-28 DIAGNOSIS — I129 Hypertensive chronic kidney disease with stage 1 through stage 4 chronic kidney disease, or unspecified chronic kidney disease: Secondary | ICD-10-CM | POA: Diagnosis not present

## 2017-01-28 DIAGNOSIS — L89322 Pressure ulcer of left buttock, stage 2: Secondary | ICD-10-CM | POA: Diagnosis not present

## 2017-01-28 DIAGNOSIS — E1122 Type 2 diabetes mellitus with diabetic chronic kidney disease: Secondary | ICD-10-CM | POA: Diagnosis not present

## 2017-01-28 DIAGNOSIS — R2689 Other abnormalities of gait and mobility: Secondary | ICD-10-CM | POA: Diagnosis not present

## 2017-01-28 DIAGNOSIS — G8929 Other chronic pain: Secondary | ICD-10-CM | POA: Diagnosis not present

## 2017-01-29 ENCOUNTER — Other Ambulatory Visit: Payer: Self-pay | Admitting: Family Medicine

## 2017-01-29 DIAGNOSIS — I700 Atherosclerosis of aorta: Secondary | ICD-10-CM

## 2017-02-02 DIAGNOSIS — L89322 Pressure ulcer of left buttock, stage 2: Secondary | ICD-10-CM | POA: Diagnosis not present

## 2017-02-02 DIAGNOSIS — E1122 Type 2 diabetes mellitus with diabetic chronic kidney disease: Secondary | ICD-10-CM | POA: Diagnosis not present

## 2017-02-02 DIAGNOSIS — R2689 Other abnormalities of gait and mobility: Secondary | ICD-10-CM | POA: Diagnosis not present

## 2017-02-02 DIAGNOSIS — I129 Hypertensive chronic kidney disease with stage 1 through stage 4 chronic kidney disease, or unspecified chronic kidney disease: Secondary | ICD-10-CM | POA: Diagnosis not present

## 2017-02-02 DIAGNOSIS — N189 Chronic kidney disease, unspecified: Secondary | ICD-10-CM | POA: Diagnosis not present

## 2017-02-02 DIAGNOSIS — Z48815 Encounter for surgical aftercare following surgery on the digestive system: Secondary | ICD-10-CM | POA: Diagnosis not present

## 2017-02-02 DIAGNOSIS — L89312 Pressure ulcer of right buttock, stage 2: Secondary | ICD-10-CM | POA: Diagnosis not present

## 2017-02-02 DIAGNOSIS — G8929 Other chronic pain: Secondary | ICD-10-CM | POA: Diagnosis not present

## 2017-02-09 ENCOUNTER — Ambulatory Visit
Admission: RE | Admit: 2017-02-09 | Discharge: 2017-02-09 | Disposition: A | Discharge status: Home / Self Care | Payer: Medicare HMO | Source: Ambulatory Visit | Attending: Family Medicine | Admitting: Family Medicine

## 2017-02-09 DIAGNOSIS — I700 Atherosclerosis of aorta: Secondary | ICD-10-CM

## 2017-02-11 DIAGNOSIS — R69 Illness, unspecified: Secondary | ICD-10-CM | POA: Diagnosis not present

## 2017-02-18 DIAGNOSIS — M722 Plantar fascial fibromatosis: Secondary | ICD-10-CM | POA: Diagnosis not present

## 2017-02-18 DIAGNOSIS — M25572 Pain in left ankle and joints of left foot: Secondary | ICD-10-CM | POA: Diagnosis not present

## 2017-02-19 DIAGNOSIS — M545 Low back pain: Secondary | ICD-10-CM | POA: Diagnosis not present

## 2017-02-24 DIAGNOSIS — E1165 Type 2 diabetes mellitus with hyperglycemia: Secondary | ICD-10-CM | POA: Diagnosis not present

## 2017-02-24 DIAGNOSIS — E1140 Type 2 diabetes mellitus with diabetic neuropathy, unspecified: Secondary | ICD-10-CM | POA: Diagnosis not present

## 2017-02-26 DIAGNOSIS — M545 Low back pain: Secondary | ICD-10-CM | POA: Diagnosis not present

## 2017-03-04 DIAGNOSIS — H25013 Cortical age-related cataract, bilateral: Secondary | ICD-10-CM | POA: Diagnosis not present

## 2017-03-04 DIAGNOSIS — Z794 Long term (current) use of insulin: Secondary | ICD-10-CM | POA: Diagnosis not present

## 2017-03-04 DIAGNOSIS — E119 Type 2 diabetes mellitus without complications: Secondary | ICD-10-CM | POA: Diagnosis not present

## 2017-03-04 DIAGNOSIS — H2513 Age-related nuclear cataract, bilateral: Secondary | ICD-10-CM | POA: Diagnosis not present

## 2017-03-05 DIAGNOSIS — E1142 Type 2 diabetes mellitus with diabetic polyneuropathy: Secondary | ICD-10-CM | POA: Diagnosis not present

## 2017-03-05 DIAGNOSIS — Z794 Long term (current) use of insulin: Secondary | ICD-10-CM | POA: Diagnosis not present

## 2017-03-05 DIAGNOSIS — Z5181 Encounter for therapeutic drug level monitoring: Secondary | ICD-10-CM | POA: Diagnosis not present

## 2017-03-05 DIAGNOSIS — E1165 Type 2 diabetes mellitus with hyperglycemia: Secondary | ICD-10-CM | POA: Diagnosis not present

## 2017-03-25 DIAGNOSIS — M2012 Hallux valgus (acquired), left foot: Secondary | ICD-10-CM | POA: Diagnosis not present

## 2017-03-25 DIAGNOSIS — M25572 Pain in left ankle and joints of left foot: Secondary | ICD-10-CM | POA: Diagnosis not present

## 2017-04-06 DIAGNOSIS — Z23 Encounter for immunization: Secondary | ICD-10-CM | POA: Diagnosis not present

## 2017-05-05 ENCOUNTER — Encounter: Payer: Self-pay | Admitting: Obstetrics & Gynecology

## 2017-05-05 ENCOUNTER — Ambulatory Visit (INDEPENDENT_AMBULATORY_CARE_PROVIDER_SITE_OTHER): Payer: Medicare HMO | Admitting: Obstetrics & Gynecology

## 2017-05-05 VITALS — BP 126/80 | Ht 66.0 in | Wt 133.0 lb

## 2017-05-05 DIAGNOSIS — Z90710 Acquired absence of both cervix and uterus: Secondary | ICD-10-CM

## 2017-05-05 DIAGNOSIS — N952 Postmenopausal atrophic vaginitis: Secondary | ICD-10-CM

## 2017-05-05 DIAGNOSIS — M810 Age-related osteoporosis without current pathological fracture: Secondary | ICD-10-CM | POA: Diagnosis not present

## 2017-05-05 DIAGNOSIS — R1032 Left lower quadrant pain: Secondary | ICD-10-CM

## 2017-05-05 DIAGNOSIS — Z1272 Encounter for screening for malignant neoplasm of vagina: Secondary | ICD-10-CM | POA: Diagnosis not present

## 2017-05-05 DIAGNOSIS — R1031 Right lower quadrant pain: Secondary | ICD-10-CM

## 2017-05-05 DIAGNOSIS — Z780 Asymptomatic menopausal state: Secondary | ICD-10-CM | POA: Diagnosis not present

## 2017-05-05 DIAGNOSIS — Z01411 Encounter for gynecological examination (general) (routine) with abnormal findings: Secondary | ICD-10-CM | POA: Diagnosis not present

## 2017-05-05 MED ORDER — ESTRADIOL 0.1 MG/GM VA CREA: 0.2500 | TOPICAL_CREAM | VAGINAL | 3 refills | Status: DC

## 2017-05-05 NOTE — Addendum Note (Signed)
Addended by: Thurnell Garbe A on: 05/05/2017 12:26 PM   Modules accepted: Orders

## 2017-05-05 NOTE — Patient Instructions (Signed)
1. Encounter for gynecological examination with abnormal finding Gyn exam s/p Hysterectomy.  Atrophic Vaginitis.  Pap reflex done.  Breasts wnl.  Mammo neg 09/2015.  Will not repeat Screening Mammo.  2. H/O total hysterectomy  3. Menopause present No HRT.  Asymptomatic.   - DG Bone Density; Future  4. Post-menopausal atrophic vaginitis Feels dry when not using Estradiol cream.  Decision to represcribe.  1/4 of an applicator twice a week as needed.  5. Age-related osteoporosis without current pathological fracture Vit D supplements/Ca++.  Weight bearing physical activity.  Osteoporosis with T Score of -2.5 at Lumbar Spine 12/2014.  Schedule Bone Density now. - DG Bone Density; Future  6. Bilateral lower abdominal discomfort U/A negative.  Normal gyn exam s/p Hysterectomy.  Refer to Fam MD if worsens.  [name] [name], it was a pleasure to meet you today!  I will inform you of your results as soon as available.   Osteoporosis Osteoporosis is the thinning and loss of density in the bones. Osteoporosis makes the bones more brittle, fragile, and likely to break (fracture). Over time, osteoporosis can cause the bones to become so weak that they fracture after a simple fall. The bones most likely to fracture are the bones in the hip, wrist, and spine. What are the causes? The exact cause is not known. What increases the risk? Anyone can develop osteoporosis. You may be at greater risk if you have a family history of the condition or have poor nutrition. You may also have a higher risk if you are:  Female.  81 years old or older.  A smoker.  Not physically active.  White or Asian.  Slender.  What are the signs or symptoms? A fracture might be the first sign of the disease, especially if it results from a fall or injury that would not usually cause a bone to break. Other signs and symptoms include:  Low back and neck pain.  Stooped posture.  Height loss.  How is this diagnosed? To  make a diagnosis, your health care provider may:  Take a medical history.  Perform a physical exam.  Order tests, such as: ? A bone mineral density test. ? A dual-energy X-ray absorptiometry test.  How is this treated? The goal of osteoporosis treatment is to strengthen your bones to reduce your risk of a fracture. Treatment may involve:  Making lifestyle changes, such as: ? Eating a diet rich in calcium. ? Doing weight-bearing and muscle-strengthening exercises. ? Stopping tobacco use. ? Limiting alcohol intake.  Taking medicine to slow the process of bone loss or to increase bone density.  Monitoring your levels of calcium and vitamin D.  Follow these instructions at home:  Include calcium and vitamin D in your diet. Calcium is important for bone health, and vitamin D helps the body absorb calcium.  Perform weight-bearing and muscle-strengthening exercises as directed by your health care provider.  Do not use any tobacco products, including cigarettes, chewing tobacco, and electronic cigarettes. If you need help quitting, ask your health care provider.  Limit your alcohol intake.  Take medicines only as directed by your health care provider.  Keep all follow-up visits as directed by your health care provider. This is important.  Take precautions at home to lower your risk of falling, such as: ? Keeping rooms well lit and clutter free. ? Installing safety rails on stairs. ? Using rubber mats in the bathroom and other areas that are often wet or slippery. Get help right away if:  You fall or injure yourself. This information is not intended to replace advice given to you by your health care provider. Make sure you discuss any questions you have with your health care provider. Document Released: 04/15/2005 Document Revised: 12/09/2015 Document Reviewed: 12/14/2013 Elsevier Interactive Patient Education  2017 [employer].

## 2017-05-05 NOTE — Progress Notes (Addendum)
[name] [name] 09-15-1933 [id number]   History:    83 y.o. [name] [name]  RP:  Established patient presenting for annual gyn exam   HPI:  Menopausal.  No HRT except Estrace cream for Atrophic Vaginitis.  No PMB.  H/O Hysterectomy.  C/O suprapubic discomfort and lower back pain.  No Urinary frequency or dysuria.  Breasts wnl.  BMs wnl.  Past medical history,surgical history, family history and social history were all reviewed and documented in the EPIC chart.  Gynecologic History No LMP recorded. Patient has had a hysterectomy. Contraception: status post hysterectomy Last Pap: 01/2011. Results were: normal Last mammogram: 09/2015. Results were: negative Dexa 12/2014  Osteoporosis T score -2.5 at Lumbar Spine Colonoscopy  Obstetric History OB History  Gravida Para Term Preterm AB Living  5 5 0 0 0    SAB TAB Ectopic Multiple Live Births  0 0 0        # Outcome Date GA Lbr Len/2nd Weight Sex Delivery Anes PTL Lv  5 Para           4 Para           3 Para           2 Para           1 Para                ROS: A ROS was performed and pertinent positives and negatives are included in the history.  GENERAL: No fevers or chills. HEENT: No change in vision, no earache, sore throat or sinus congestion. NECK: No pain or stiffness. CARDIOVASCULAR: No chest pain or pressure. No palpitations. PULMONARY: No shortness of breath, cough or wheeze. GASTROINTESTINAL: No abdominal pain, nausea, vomiting or diarrhea, melena or bright red blood per rectum. GENITOURINARY: No urinary frequency, urgency, hesitancy or dysuria. MUSCULOSKELETAL: No joint or muscle pain, no back pain, no recent trauma. DERMATOLOGIC: No rash, no itching, no lesions. ENDOCRINE: No polyuria, polydipsia, no heat or cold intolerance. No recent change in weight. HEMATOLOGICAL: No anemia or easy bruising or bleeding. NEUROLOGIC: No headache, seizures, numbness, tingling or weakness. PSYCHIATRIC: No depression, no loss of interest in normal  activity or change in sleep pattern.     Exam:   BP 126/80   Ht 5\' 6"  (1.676 m)   Wt 133 lb (60.3 kg)   BMI 21.47 kg/m   Body mass index is 21.47 kg/m.  General appearance : Well developed well nourished female. No acute distress HEENT: Eyes: no retinal hemorrhage or exudates,  Neck supple, trachea midline, no carotid bruits, no thyroidmegaly Lungs: Clear to auscultation, no rhonchi or wheezes, or rib retractions  Heart: Regular rate and rhythm, no murmurs or gallops Breast:Examined in sitting and supine position were symmetrical in appearance, no palpable masses or tenderness,  no skin retraction, no nipple inversion, no nipple discharge, no skin discoloration, no axillary or supraclavicular lymphadenopathy Abdomen: no palpable masses or tenderness, no rebound or guarding Extremities: no edema or skin discoloration or tenderness  Pelvic: Vulva  Mildly atrophic  Bartholin, Urethra, Skene Glands: Within normal limits             Vagina: No gross lesions or discharge.  Pap reflex done.  Cervix/Uterus Absent  Adnexa  Without masses or tenderness  Anus and perineum  normal     U/A negative   Assessment/Plan:  83 y.o. female for annual exam   1. Encounter for gynecological examination with abnormal finding Gyn exam s/p Hysterectomy.  Atrophic Vaginitis.  Pap reflex done.  Breasts wnl.  Mammo neg 09/2015.  Will not repeat Screening Mammo.  2. H/O total hysterectomy  3. Menopause present No HRT.  Asymptomatic.   - DG Bone Density; Future  4. Post-menopausal atrophic vaginitis Feels dry when not using Estradiol cream.  Decision to represcribe.  1/4 of an applicator twice a week as needed.  5. Age-related osteoporosis without current pathological fracture Vit D supplements/Ca++.  Weight bearing physical activity.  Osteoporosis with T Score of -2.5 at Lumbar Spine 12/2014.  Schedule Bone Density now. - DG Bone Density; Future  6. Bilateral lower abdominal discomfort U/A  negative.  Normal gyn exam s/p Hysterectomy.  Refer to Fam MD if worsens.  Counseling on above issues >50% x 15 minutes  Princess Bruins MD, 9:38 AM 05/05/2017

## 2017-05-06 LAB — URINALYSIS W MICROSCOPIC + REFLEX CULTURE
BILIRUBIN URINE: NEGATIVE
Bacteria, UA: NONE SEEN /HPF
Glucose, UA: NEGATIVE
HYALINE CAST: NONE SEEN /LPF
Ketones, ur: NEGATIVE
Leukocyte Esterase: NEGATIVE
Nitrites, Initial: NEGATIVE
PROTEIN: NEGATIVE
Specific Gravity, Urine: 1.007 (ref 1.001–1.03)
pH: 6 (ref 5.0–8.0)

## 2017-05-06 LAB — PAP IG W/ RFLX HPV ASCU

## 2017-05-06 LAB — NO CULTURE INDICATED

## 2017-05-07 ENCOUNTER — Encounter: Payer: Self-pay | Admitting: *Deleted

## 2017-05-17 ENCOUNTER — Other Ambulatory Visit: Payer: Self-pay | Admitting: Gynecology

## 2017-05-17 DIAGNOSIS — M810 Age-related osteoporosis without current pathological fracture: Secondary | ICD-10-CM

## 2017-05-25 DIAGNOSIS — E1140 Type 2 diabetes mellitus with diabetic neuropathy, unspecified: Secondary | ICD-10-CM | POA: Diagnosis not present

## 2017-05-25 DIAGNOSIS — E782 Mixed hyperlipidemia: Secondary | ICD-10-CM | POA: Diagnosis not present

## 2017-05-25 DIAGNOSIS — I10 Essential (primary) hypertension: Secondary | ICD-10-CM | POA: Diagnosis not present

## 2017-05-25 DIAGNOSIS — K219 Gastro-esophageal reflux disease without esophagitis: Secondary | ICD-10-CM | POA: Diagnosis not present

## 2017-05-25 DIAGNOSIS — M150 Primary generalized (osteo)arthritis: Secondary | ICD-10-CM | POA: Diagnosis not present

## 2017-05-25 DIAGNOSIS — E1142 Type 2 diabetes mellitus with diabetic polyneuropathy: Secondary | ICD-10-CM | POA: Diagnosis not present

## 2017-05-25 DIAGNOSIS — E039 Hypothyroidism, unspecified: Secondary | ICD-10-CM | POA: Diagnosis not present

## 2017-05-25 DIAGNOSIS — E1165 Type 2 diabetes mellitus with hyperglycemia: Secondary | ICD-10-CM | POA: Diagnosis not present

## 2017-05-25 DIAGNOSIS — I700 Atherosclerosis of aorta: Secondary | ICD-10-CM | POA: Diagnosis not present

## 2017-05-25 DIAGNOSIS — N182 Chronic kidney disease, stage 2 (mild): Secondary | ICD-10-CM | POA: Diagnosis not present

## 2017-05-25 DIAGNOSIS — M810 Age-related osteoporosis without current pathological fracture: Secondary | ICD-10-CM | POA: Diagnosis not present

## 2017-05-27 ENCOUNTER — Ambulatory Visit (INDEPENDENT_AMBULATORY_CARE_PROVIDER_SITE_OTHER): Payer: Medicare HMO

## 2017-05-27 DIAGNOSIS — M810 Age-related osteoporosis without current pathological fracture: Secondary | ICD-10-CM | POA: Diagnosis not present

## 2017-06-01 ENCOUNTER — Telehealth: Payer: Self-pay | Admitting: Gynecology

## 2017-06-01 MED ORDER — ALENDRONATE SODIUM 70 MG PO TABS: 70.0000 mg | ORAL_TABLET | ORAL | 3 refills | Status: DC

## 2017-06-01 NOTE — Telephone Encounter (Signed)
Rx sent. Pt aware.  

## 2017-06-01 NOTE — Telephone Encounter (Signed)
Pt informed, Dr.Lavoie do you want patient to continue alendronate Rx? Pt doesn't have Rx. Please advise

## 2017-06-01 NOTE — Telephone Encounter (Signed)
Tell patient her most recent bone density is stable from her prior study.  I see that she was started on alendronate by Dr. Toney Rakes.  I do not see from Dr Assunta Curtis note whether she was continuing on this.  It seems prudent to me that she should continue on alendronate assuming she is not having any difficulties with this.  If she is not currently taking alendronate then I would recommend she discuss this with Dr Dellis Filbert.

## 2017-06-01 NOTE — Telephone Encounter (Signed)
Yes, represcribe Alendronate until next year.

## 2017-06-18 DIAGNOSIS — L97522 Non-pressure chronic ulcer of other part of left foot with fat layer exposed: Secondary | ICD-10-CM | POA: Diagnosis not present

## 2017-06-18 DIAGNOSIS — M7752 Other enthesopathy of left foot: Secondary | ICD-10-CM | POA: Diagnosis not present

## 2017-06-22 DIAGNOSIS — Z794 Long term (current) use of insulin: Secondary | ICD-10-CM | POA: Diagnosis not present

## 2017-06-22 DIAGNOSIS — E1165 Type 2 diabetes mellitus with hyperglycemia: Secondary | ICD-10-CM | POA: Diagnosis not present

## 2017-06-22 DIAGNOSIS — Z5181 Encounter for therapeutic drug level monitoring: Secondary | ICD-10-CM | POA: Diagnosis not present

## 2017-06-22 DIAGNOSIS — E1142 Type 2 diabetes mellitus with diabetic polyneuropathy: Secondary | ICD-10-CM | POA: Diagnosis not present

## 2017-07-02 DIAGNOSIS — M21612 Bunion of left foot: Secondary | ICD-10-CM | POA: Diagnosis not present

## 2017-07-02 DIAGNOSIS — L97522 Non-pressure chronic ulcer of other part of left foot with fat layer exposed: Secondary | ICD-10-CM | POA: Diagnosis not present

## 2017-07-07 DIAGNOSIS — E039 Hypothyroidism, unspecified: Secondary | ICD-10-CM | POA: Diagnosis not present

## 2017-07-23 DIAGNOSIS — L97521 Non-pressure chronic ulcer of other part of left foot limited to breakdown of skin: Secondary | ICD-10-CM | POA: Diagnosis not present

## 2017-08-04 DIAGNOSIS — M170 Bilateral primary osteoarthritis of knee: Secondary | ICD-10-CM | POA: Diagnosis not present

## 2017-08-17 DIAGNOSIS — J0100 Acute maxillary sinusitis, unspecified: Secondary | ICD-10-CM | POA: Diagnosis not present

## 2017-08-17 DIAGNOSIS — K5902 Outlet dysfunction constipation: Secondary | ICD-10-CM | POA: Diagnosis not present

## 2017-08-25 DIAGNOSIS — E1165 Type 2 diabetes mellitus with hyperglycemia: Secondary | ICD-10-CM | POA: Diagnosis not present

## 2017-08-25 DIAGNOSIS — Z794 Long term (current) use of insulin: Secondary | ICD-10-CM | POA: Diagnosis not present

## 2017-08-25 DIAGNOSIS — E119 Type 2 diabetes mellitus without complications: Secondary | ICD-10-CM | POA: Diagnosis not present

## 2017-08-25 DIAGNOSIS — E1140 Type 2 diabetes mellitus with diabetic neuropathy, unspecified: Secondary | ICD-10-CM | POA: Diagnosis not present

## 2017-09-08 DIAGNOSIS — E039 Hypothyroidism, unspecified: Secondary | ICD-10-CM | POA: Diagnosis not present

## 2017-09-29 ENCOUNTER — Encounter: Payer: Self-pay | Admitting: Women's Health

## 2017-09-29 ENCOUNTER — Ambulatory Visit: Payer: Medicare HMO | Admitting: Women's Health

## 2017-09-29 VITALS — BP 124/80

## 2017-09-29 DIAGNOSIS — R300 Dysuria: Secondary | ICD-10-CM

## 2017-09-29 DIAGNOSIS — B373 Candidiasis of vulva and vagina: Secondary | ICD-10-CM

## 2017-09-29 DIAGNOSIS — B3731 Acute candidiasis of vulva and vagina: Secondary | ICD-10-CM

## 2017-09-29 LAB — WET PREP FOR TRICH, YEAST, CLUE

## 2017-09-29 MED ORDER — FLUCONAZOLE 150 MG PO TABS
150.0000 mg | ORAL_TABLET | Freq: Once | ORAL | 1 refills | Status: AC
Start: 2017-09-29 — End: 2017-09-29

## 2017-09-29 NOTE — Patient Instructions (Signed)

## 2017-09-29 NOTE — Progress Notes (Addendum)
84 y.o G5P5 presents with complaints of vaginal, pelvic, back pain for  one year, and mild headache. Symptom started after small bowel obstruction and lysis of adhesions on 12/03/2016. Denies vaginal discharge, irritation, itching. Had bowel movement today but does not feel empty, states continues with some constipation.Takes Miralax occasionally for contraption. TAH, as needed Estrace cream for Atrophic Vaginitis. Not sexually active,  History of normal pap and mammogram,   Exam: Appears well, no visible discomfort. Abdomen soft, distention,External genitalia, dry, no erythema at introitus. Speculum exam, no erythema, mild thick white discharge, no odor noted. Wet prep positive for yeast.  UA: Negative leukocytes, negative blood, no wbc's, no RBCs  Yeast Vaginitis Constipation Chronic low abdominal/ pelvic discomfort  Plain: Fluconazole 150 mg, 1 tablet, PO. Reviewed yeast infection prevention methods.  Instructed to take the Muralax every other day for contraption prevention. Instructed to see a GI doctor if the symptom continues. Advised to drink water frequently and exercise.

## 2017-09-30 LAB — URINALYSIS, COMPLETE W/RFL CULTURE
Bacteria, UA: NONE SEEN /HPF
Bilirubin Urine: NEGATIVE
Glucose, UA: NEGATIVE
Hgb urine dipstick: NEGATIVE
Hyaline Cast: NONE SEEN /LPF
Ketones, ur: NEGATIVE
LEUKOCYTE ESTERASE: NEGATIVE
NITRITES URINE, INITIAL: NEGATIVE
PROTEIN: NEGATIVE
RBC / HPF: NONE SEEN /HPF (ref 0–2)
SPECIFIC GRAVITY, URINE: 1.01 (ref 1.001–1.03)
WBC, UA: NONE SEEN /HPF (ref 0–5)
pH: 6.5 (ref 5.0–8.0)

## 2017-09-30 LAB — NO CULTURE INDICATED

## 2017-10-01 DIAGNOSIS — R69 Illness, unspecified: Secondary | ICD-10-CM | POA: Diagnosis not present

## 2017-10-14 DIAGNOSIS — Z5181 Encounter for therapeutic drug level monitoring: Secondary | ICD-10-CM | POA: Diagnosis not present

## 2017-10-14 DIAGNOSIS — E1142 Type 2 diabetes mellitus with diabetic polyneuropathy: Secondary | ICD-10-CM | POA: Diagnosis not present

## 2017-10-14 DIAGNOSIS — Z794 Long term (current) use of insulin: Secondary | ICD-10-CM | POA: Diagnosis not present

## 2017-10-27 DIAGNOSIS — E119 Type 2 diabetes mellitus without complications: Secondary | ICD-10-CM | POA: Diagnosis not present

## 2017-10-27 DIAGNOSIS — Z794 Long term (current) use of insulin: Secondary | ICD-10-CM | POA: Diagnosis not present

## 2017-10-27 DIAGNOSIS — E1165 Type 2 diabetes mellitus with hyperglycemia: Secondary | ICD-10-CM | POA: Diagnosis not present

## 2017-10-27 DIAGNOSIS — E1140 Type 2 diabetes mellitus with diabetic neuropathy, unspecified: Secondary | ICD-10-CM | POA: Diagnosis not present

## 2017-10-28 DIAGNOSIS — E1142 Type 2 diabetes mellitus with diabetic polyneuropathy: Secondary | ICD-10-CM | POA: Diagnosis not present

## 2017-10-29 DIAGNOSIS — M21612 Bunion of left foot: Secondary | ICD-10-CM | POA: Diagnosis not present

## 2017-10-29 DIAGNOSIS — M7752 Other enthesopathy of left foot: Secondary | ICD-10-CM | POA: Diagnosis not present

## 2017-10-29 DIAGNOSIS — M25572 Pain in left ankle and joints of left foot: Secondary | ICD-10-CM | POA: Diagnosis not present

## 2017-11-10 DIAGNOSIS — E039 Hypothyroidism, unspecified: Secondary | ICD-10-CM | POA: Diagnosis not present

## 2017-11-10 DIAGNOSIS — E1142 Type 2 diabetes mellitus with diabetic polyneuropathy: Secondary | ICD-10-CM | POA: Diagnosis not present

## 2017-11-24 DIAGNOSIS — Z1231 Encounter for screening mammogram for malignant neoplasm of breast: Secondary | ICD-10-CM | POA: Diagnosis not present

## 2017-11-24 DIAGNOSIS — D508 Other iron deficiency anemias: Secondary | ICD-10-CM | POA: Diagnosis not present

## 2017-11-24 DIAGNOSIS — N182 Chronic kidney disease, stage 2 (mild): Secondary | ICD-10-CM | POA: Diagnosis not present

## 2017-11-24 DIAGNOSIS — I10 Essential (primary) hypertension: Secondary | ICD-10-CM | POA: Diagnosis not present

## 2017-11-24 DIAGNOSIS — Z79899 Other long term (current) drug therapy: Secondary | ICD-10-CM | POA: Diagnosis not present

## 2017-11-24 DIAGNOSIS — E1142 Type 2 diabetes mellitus with diabetic polyneuropathy: Secondary | ICD-10-CM | POA: Diagnosis not present

## 2017-11-24 DIAGNOSIS — Z794 Long term (current) use of insulin: Secondary | ICD-10-CM | POA: Diagnosis not present

## 2017-12-01 DIAGNOSIS — D508 Other iron deficiency anemias: Secondary | ICD-10-CM | POA: Diagnosis not present

## 2017-12-09 DIAGNOSIS — E0842 Diabetes mellitus due to underlying condition with diabetic polyneuropathy: Secondary | ICD-10-CM | POA: Diagnosis not present

## 2017-12-09 DIAGNOSIS — E782 Mixed hyperlipidemia: Secondary | ICD-10-CM | POA: Diagnosis not present

## 2017-12-09 DIAGNOSIS — M810 Age-related osteoporosis without current pathological fracture: Secondary | ICD-10-CM | POA: Diagnosis not present

## 2017-12-09 DIAGNOSIS — N182 Chronic kidney disease, stage 2 (mild): Secondary | ICD-10-CM | POA: Diagnosis not present

## 2017-12-09 DIAGNOSIS — E1165 Type 2 diabetes mellitus with hyperglycemia: Secondary | ICD-10-CM | POA: Diagnosis not present

## 2017-12-09 DIAGNOSIS — E039 Hypothyroidism, unspecified: Secondary | ICD-10-CM | POA: Diagnosis not present

## 2017-12-09 DIAGNOSIS — D508 Other iron deficiency anemias: Secondary | ICD-10-CM | POA: Diagnosis not present

## 2017-12-09 DIAGNOSIS — Z794 Long term (current) use of insulin: Secondary | ICD-10-CM | POA: Diagnosis not present

## 2017-12-09 DIAGNOSIS — I10 Essential (primary) hypertension: Secondary | ICD-10-CM | POA: Diagnosis not present

## 2017-12-09 DIAGNOSIS — M150 Primary generalized (osteo)arthritis: Secondary | ICD-10-CM | POA: Diagnosis not present

## 2017-12-10 ENCOUNTER — Other Ambulatory Visit: Payer: Self-pay | Admitting: Internal Medicine

## 2017-12-10 DIAGNOSIS — Z1231 Encounter for screening mammogram for malignant neoplasm of breast: Secondary | ICD-10-CM

## 2017-12-30 DIAGNOSIS — E039 Hypothyroidism, unspecified: Secondary | ICD-10-CM | POA: Diagnosis not present

## 2017-12-30 DIAGNOSIS — I10 Essential (primary) hypertension: Secondary | ICD-10-CM | POA: Diagnosis not present

## 2017-12-30 DIAGNOSIS — N898 Other specified noninflammatory disorders of vagina: Secondary | ICD-10-CM | POA: Diagnosis not present

## 2017-12-30 DIAGNOSIS — E785 Hyperlipidemia, unspecified: Secondary | ICD-10-CM | POA: Diagnosis not present

## 2017-12-30 DIAGNOSIS — M810 Age-related osteoporosis without current pathological fracture: Secondary | ICD-10-CM | POA: Diagnosis not present

## 2017-12-30 DIAGNOSIS — Z794 Long term (current) use of insulin: Secondary | ICD-10-CM | POA: Diagnosis not present

## 2017-12-30 DIAGNOSIS — K5900 Constipation, unspecified: Secondary | ICD-10-CM | POA: Diagnosis not present

## 2017-12-30 DIAGNOSIS — E1165 Type 2 diabetes mellitus with hyperglycemia: Secondary | ICD-10-CM | POA: Diagnosis not present

## 2017-12-30 DIAGNOSIS — J309 Allergic rhinitis, unspecified: Secondary | ICD-10-CM | POA: Diagnosis not present

## 2017-12-30 DIAGNOSIS — K219 Gastro-esophageal reflux disease without esophagitis: Secondary | ICD-10-CM | POA: Diagnosis not present

## 2018-01-04 ENCOUNTER — Ambulatory Visit
Admission: RE | Admit: 2018-01-04 | Discharge: 2018-01-04 | Disposition: A | Discharge status: Home / Self Care | Payer: Medicare HMO | Source: Ambulatory Visit | Attending: Internal Medicine | Admitting: Internal Medicine

## 2018-01-04 DIAGNOSIS — Z1231 Encounter for screening mammogram for malignant neoplasm of breast: Secondary | ICD-10-CM

## 2018-01-08 IMAGING — CT CT HEAD W/O CM
3 of 4 series · 15 of 47 positions shown, 18 images · non-contrast
Comparison: 11/27/2016, 11/25/2016

CLINICAL DATA: Fatigue, altered mental status

EXAM:
CT HEAD WITHOUT CONTRAST
TECHNIQUE: Contiguous axial images were obtained from the base of the skull
through the vertex without intravenous contrast.

[Series 2: head w/o · axial · non-contrast · 0.45mm/px · z∈[-164,-44]mm · 9 of 30 slices shown, 12 images]
[im 3/30  brain]
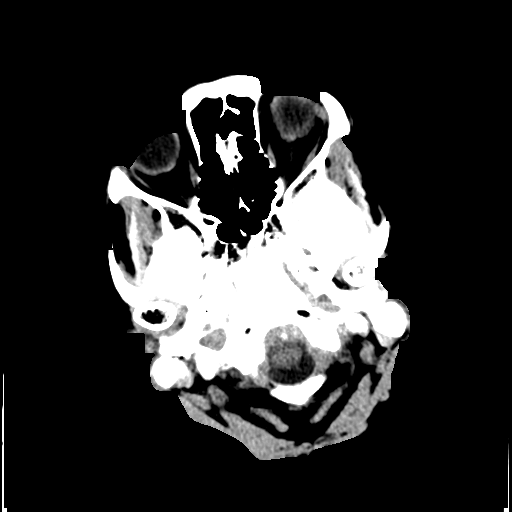
[im 3/30  bone]
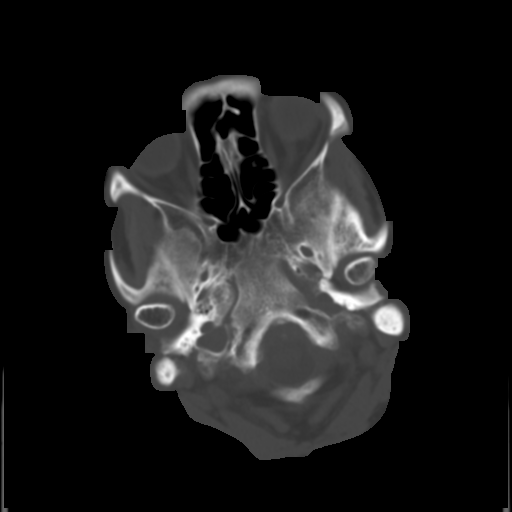
[im 7/30  brain]
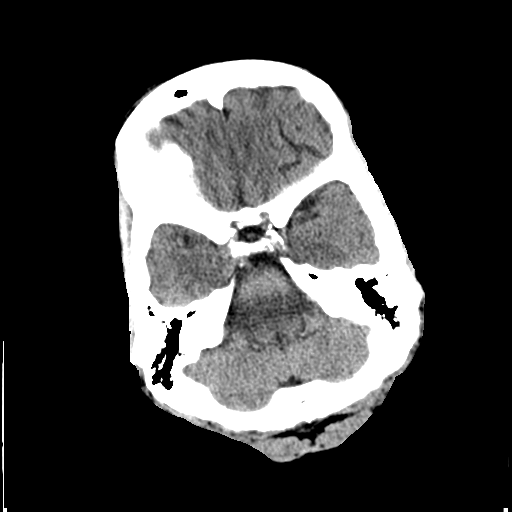
[im 9/30  brain]
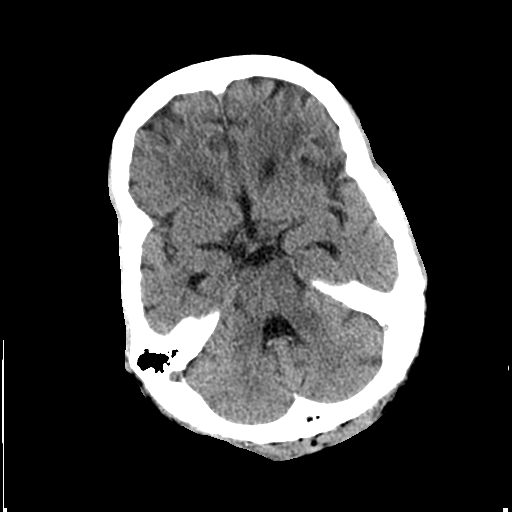
[im 13/30  brain]
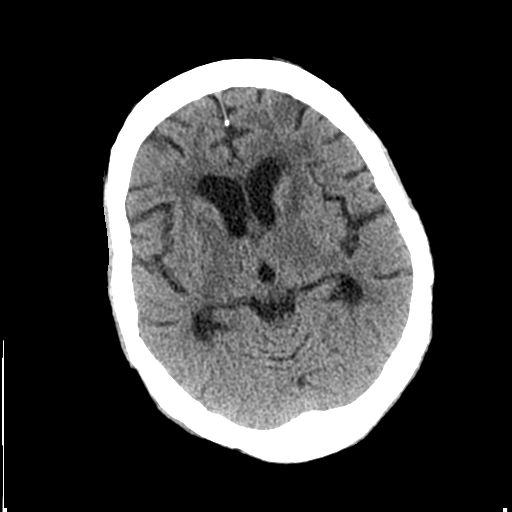
[im 15/30  brain]
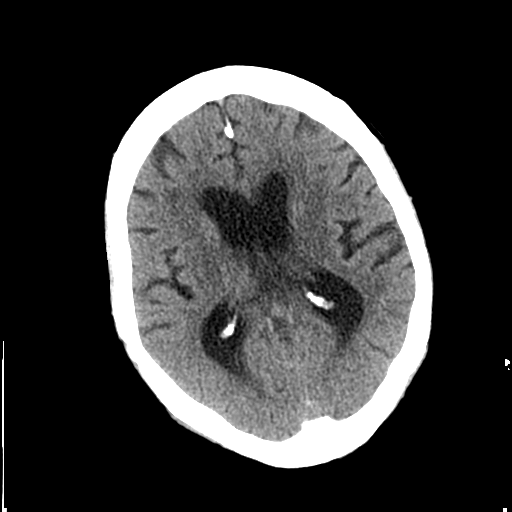
[im 15/30  bone]
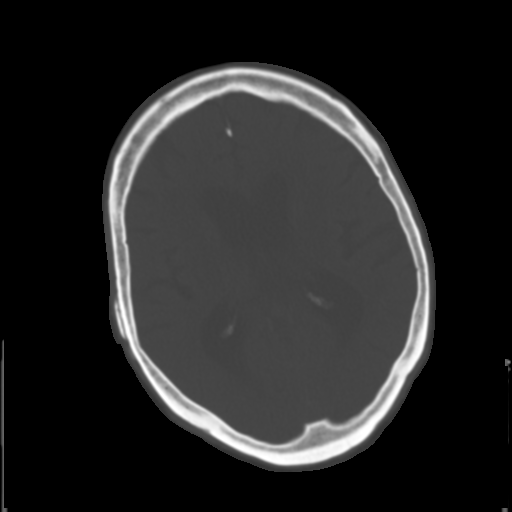
[im 17/30  brain]
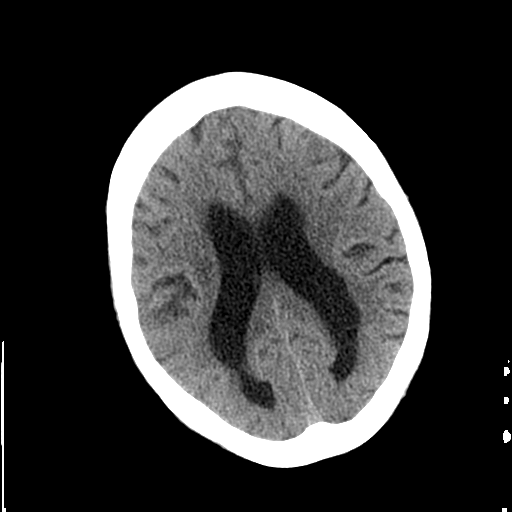
[im 21/30  brain]
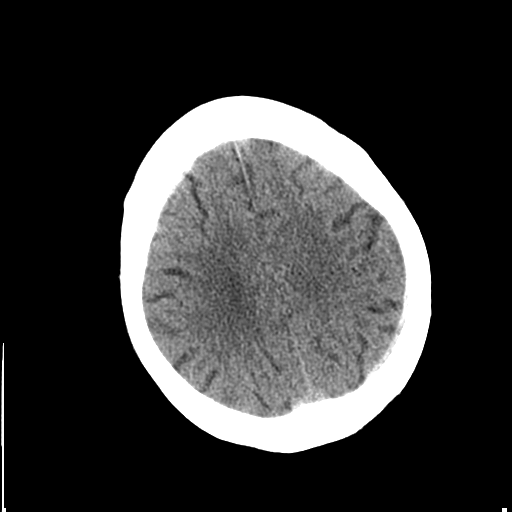
[im 23/30  brain]
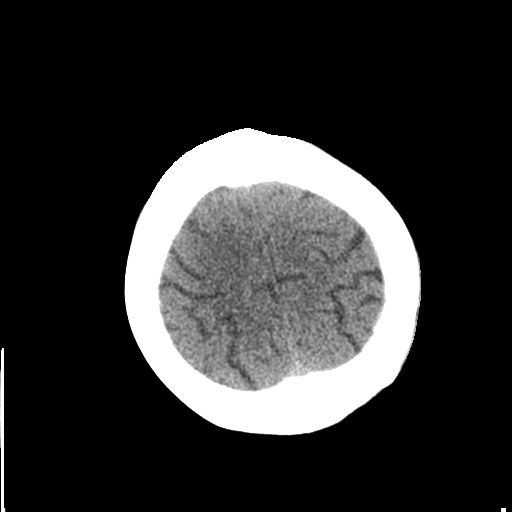
[im 27/30  brain]
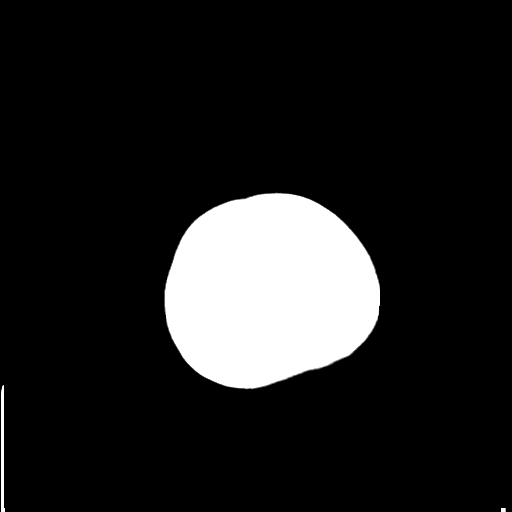
[im 27/30  bone]
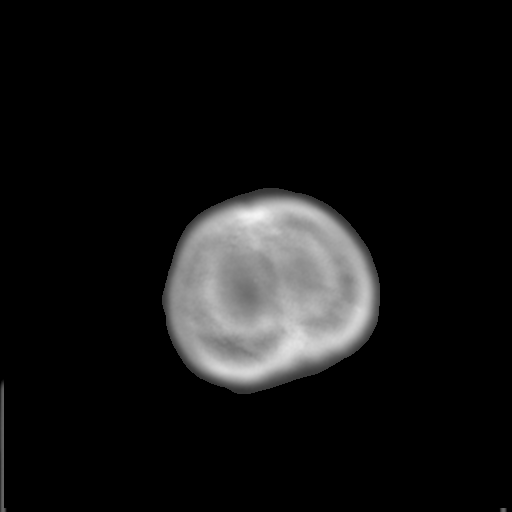

[Series 5: coronal · coronal · 0.29mm/px · 3 of 63 slices shown]
[im 21/63  brain]
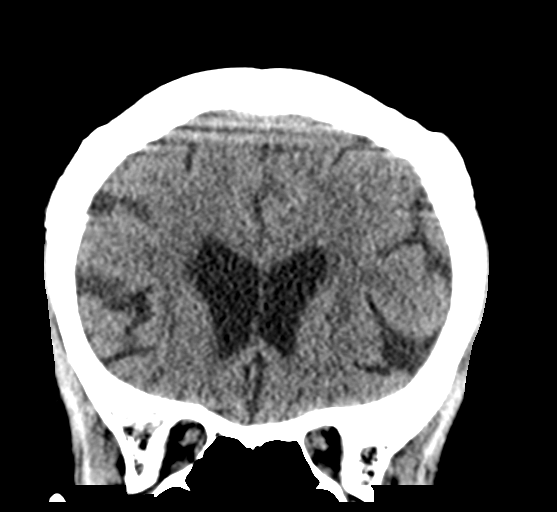
[im 28/63  brain]
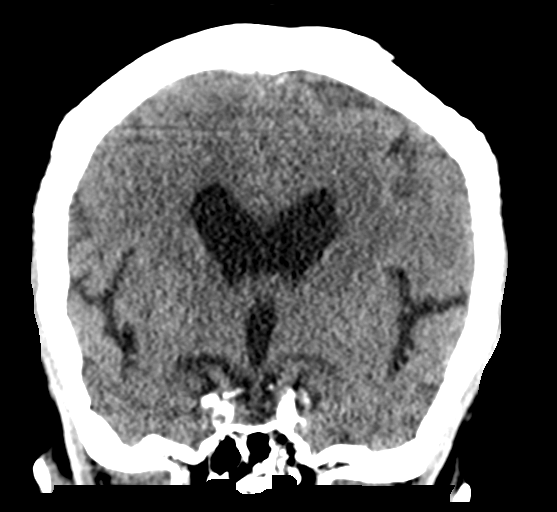
[im 35/63  brain]
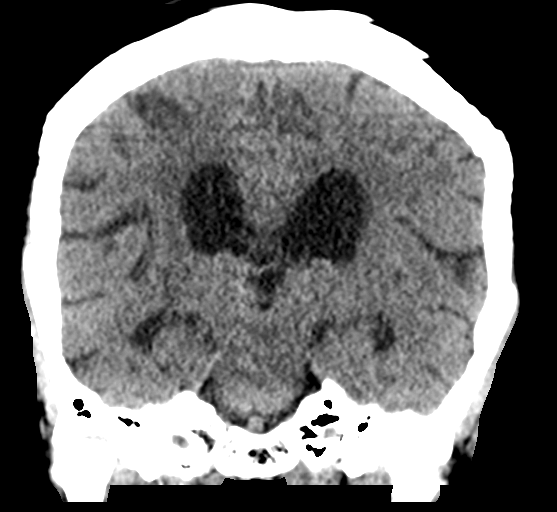

[Series 6: sagittal · sagittal · 0.29mm/px · 3 of 49 slices shown]
[im 17/49  brain]
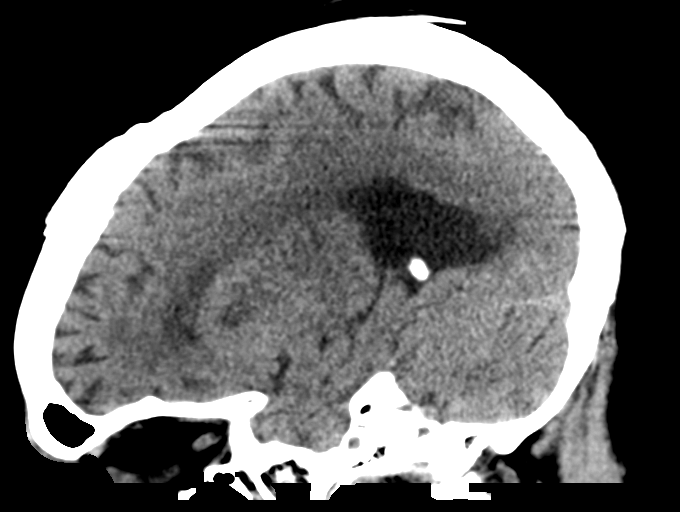
[im 25/49  brain]
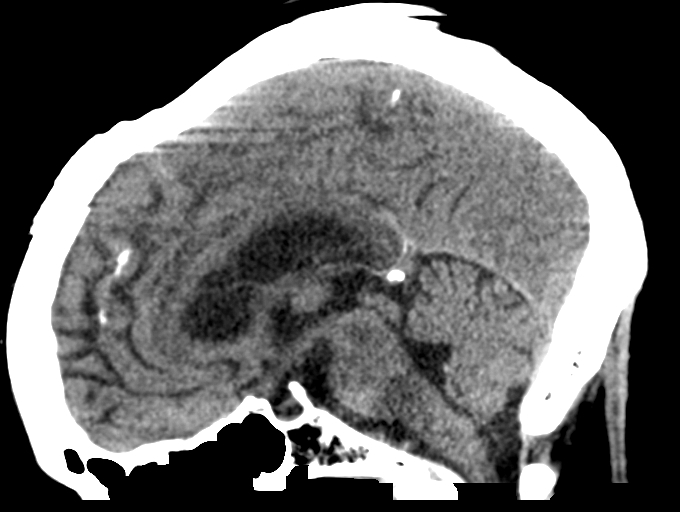
[im 33/49  brain]
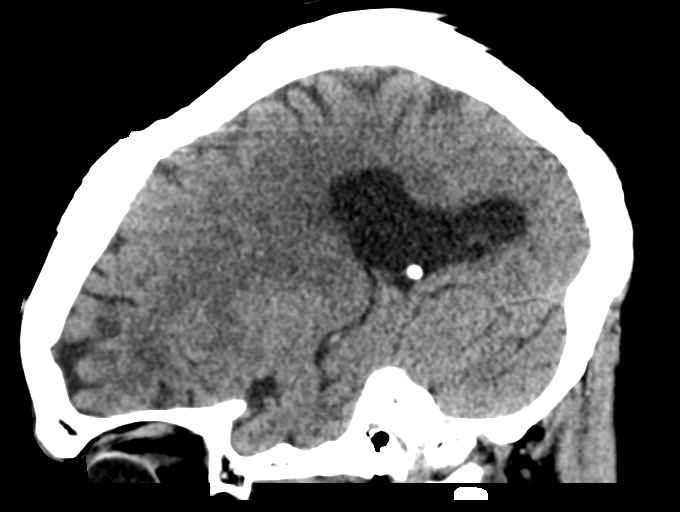

[15 of 47 positions shown; findings below may reference images not displayed]

FINDINGS: Brain: No acute territorial infarction, hemorrhage or intracranial
mass. Stable slightly enlarged ventricles. Stable periventricular
and deep white matter small vessel changes. No midline shift.

Vascular: No hyperdense vessels.  Carotid artery calcifications.

Skull: No fracture or suspicious bone lesion.

Sinuses/Orbits: No acute finding.

Other: None
IMPRESSION: Stable CT examination of the brain. There are no acute abnormalities
visualized.

## 2018-01-08 IMAGING — CR DG CHEST 2V
2 series · 2 of 2 positions shown · non-contrast
Comparison: 11/25/2016

CLINICAL DATA: Hypoxia

EXAM:
CHEST  2 VIEW

[w chest lat]
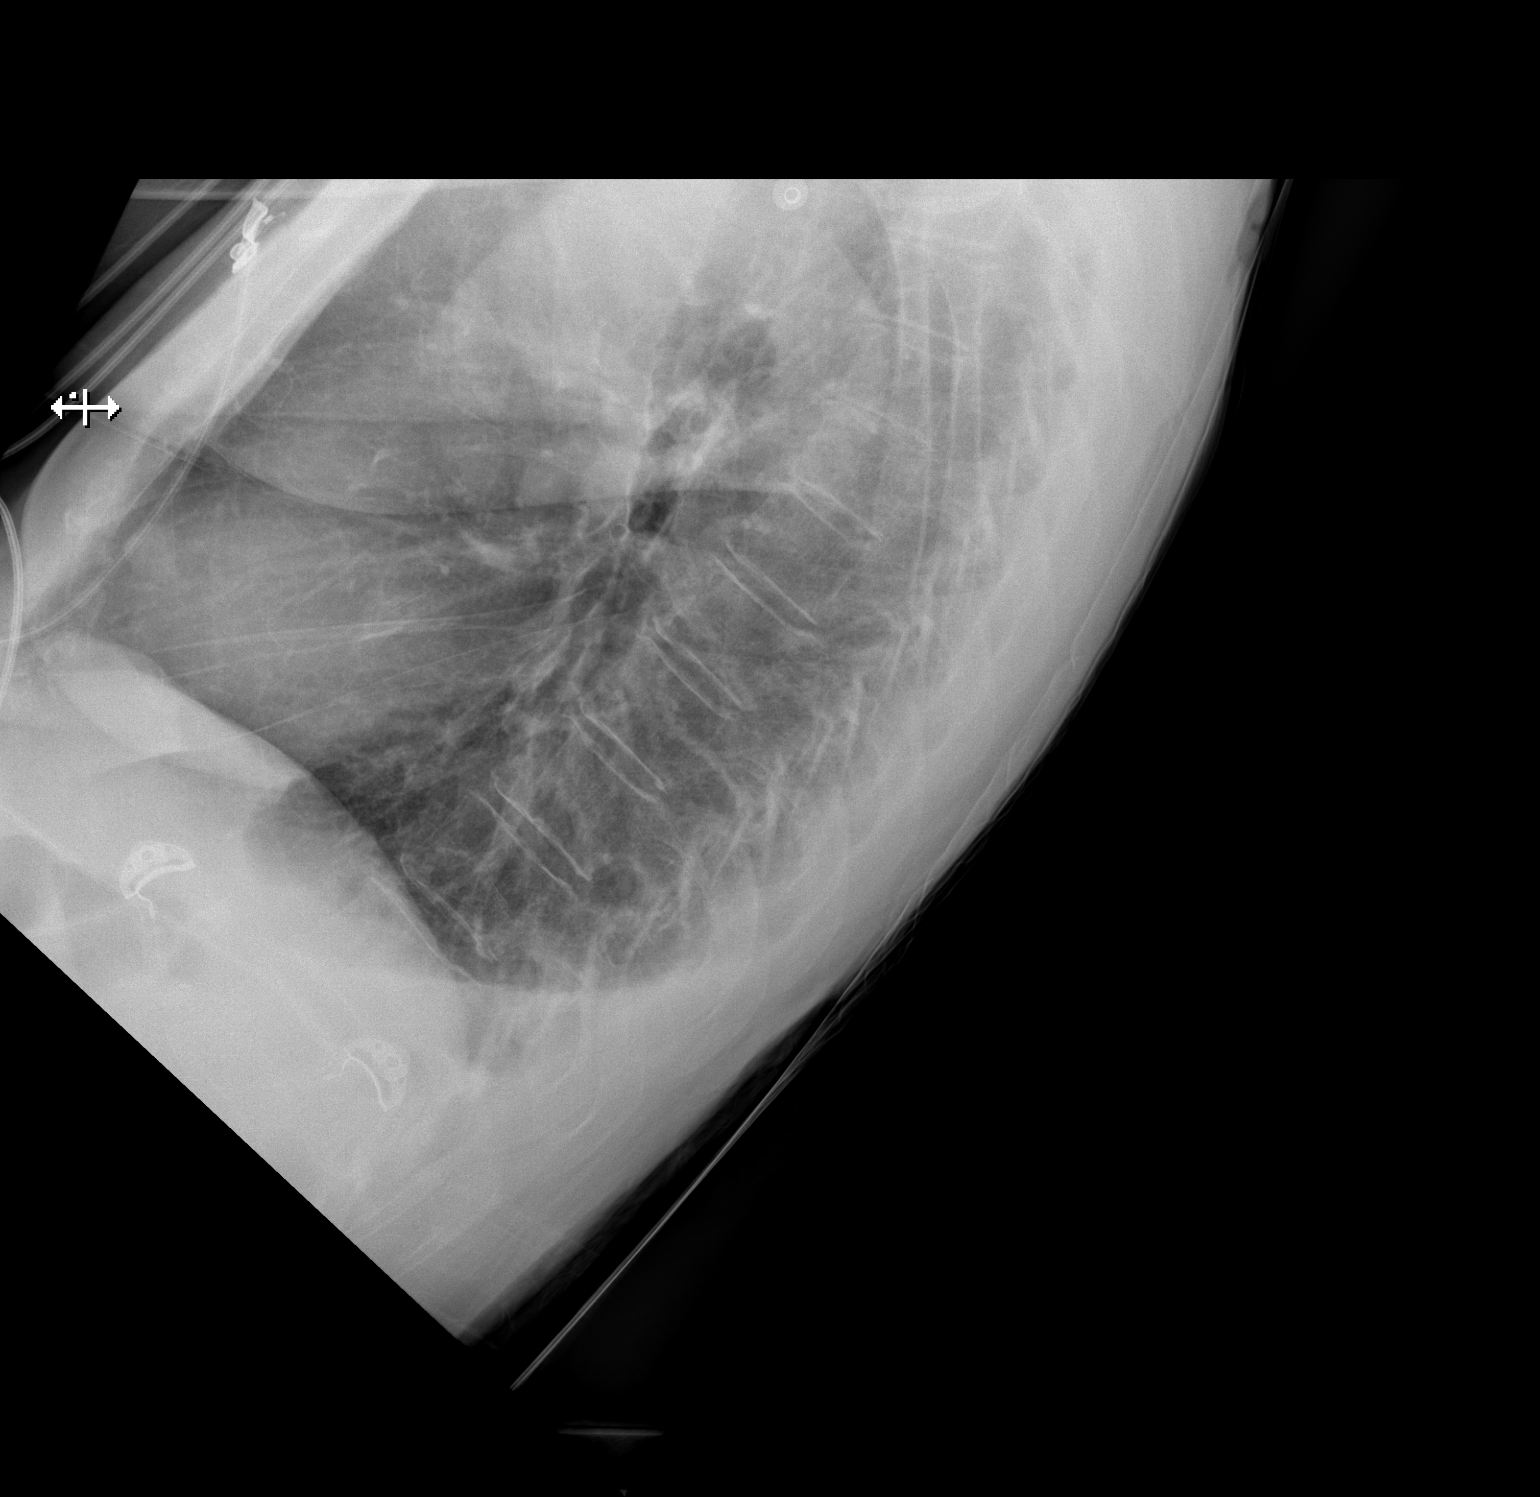

[x chest ap]
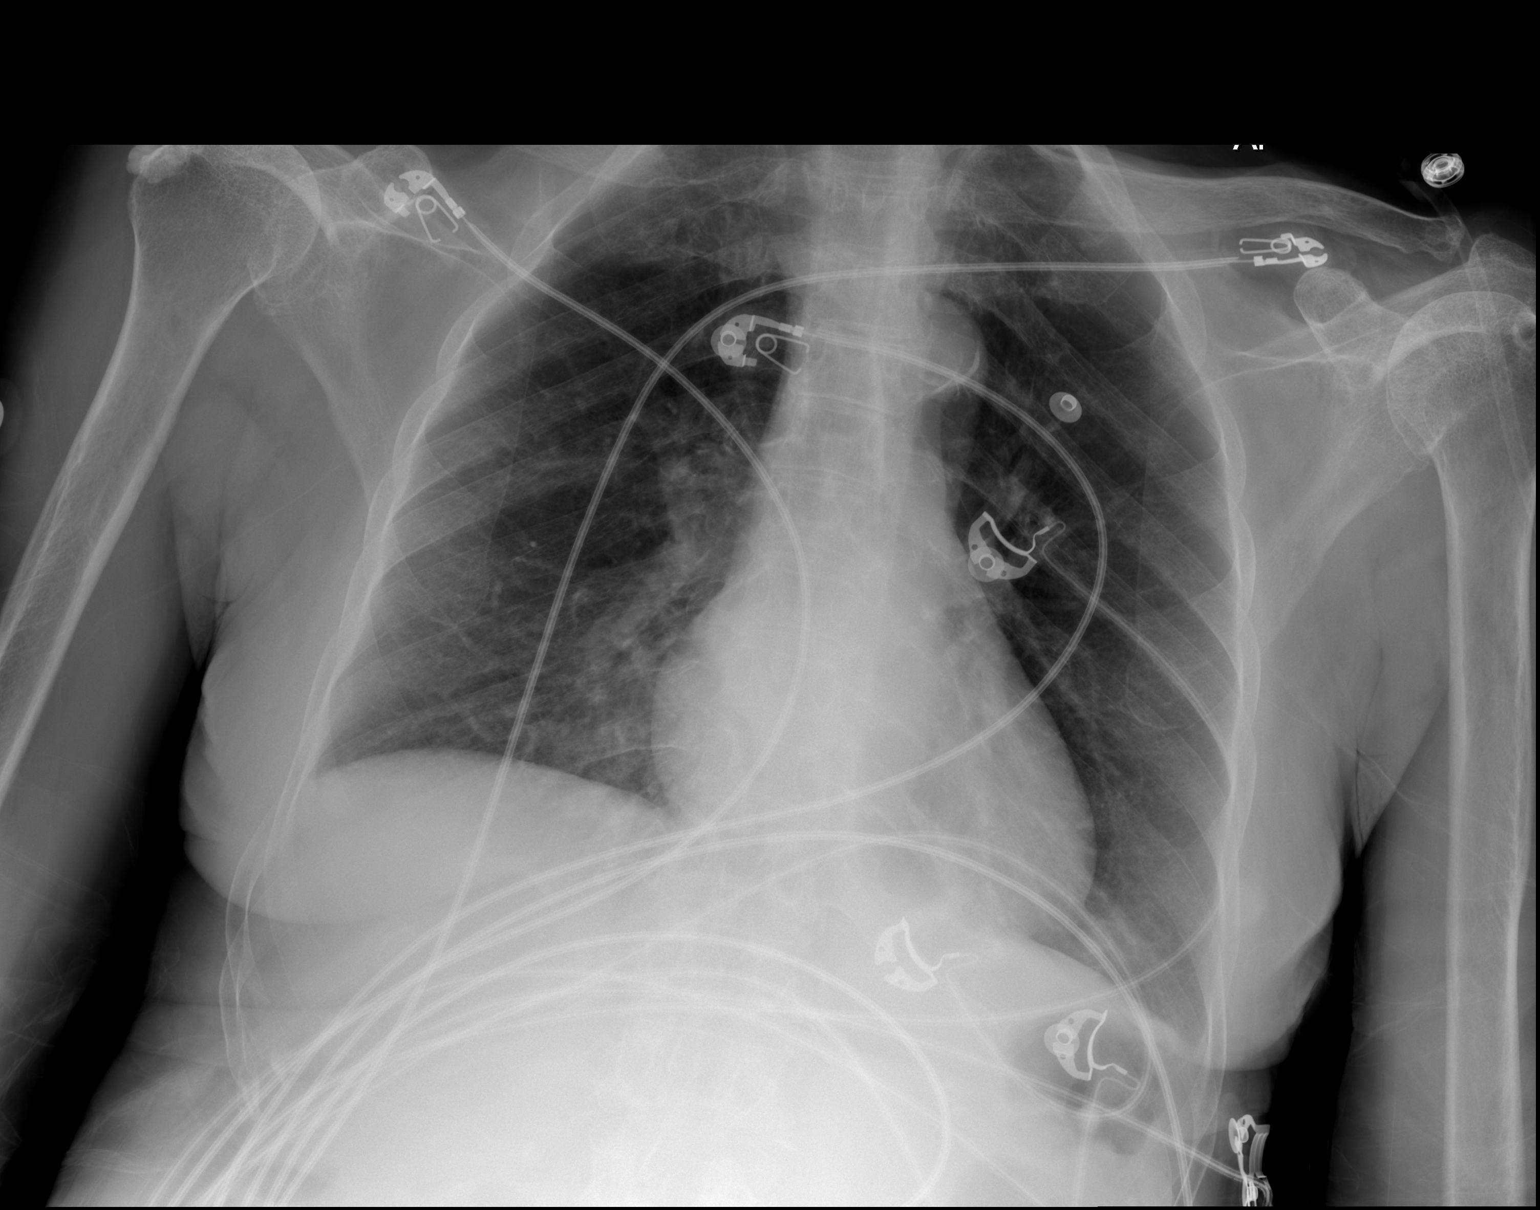

[2 of 2 positions shown; findings below may reference images not displayed]

FINDINGS: There is no focal parenchymal opacity. There is no pleural effusion
or pneumothorax. There is right shoulder calcific tendinosis versus
calcific bursitis.

The osseous structures are unremarkable.
IMPRESSION: No active cardiopulmonary disease.

## 2018-01-18 DIAGNOSIS — E782 Mixed hyperlipidemia: Secondary | ICD-10-CM | POA: Diagnosis not present

## 2018-01-18 DIAGNOSIS — I10 Essential (primary) hypertension: Secondary | ICD-10-CM | POA: Diagnosis not present

## 2018-01-18 DIAGNOSIS — E039 Hypothyroidism, unspecified: Secondary | ICD-10-CM | POA: Diagnosis not present

## 2018-01-18 DIAGNOSIS — E0842 Diabetes mellitus due to underlying condition with diabetic polyneuropathy: Secondary | ICD-10-CM | POA: Diagnosis not present

## 2018-01-18 DIAGNOSIS — D509 Iron deficiency anemia, unspecified: Secondary | ICD-10-CM | POA: Diagnosis not present

## 2018-01-18 DIAGNOSIS — M150 Primary generalized (osteo)arthritis: Secondary | ICD-10-CM | POA: Diagnosis not present

## 2018-01-18 DIAGNOSIS — N182 Chronic kidney disease, stage 2 (mild): Secondary | ICD-10-CM | POA: Diagnosis not present

## 2018-01-18 DIAGNOSIS — M810 Age-related osteoporosis without current pathological fracture: Secondary | ICD-10-CM | POA: Diagnosis not present

## 2018-01-26 DIAGNOSIS — E119 Type 2 diabetes mellitus without complications: Secondary | ICD-10-CM | POA: Diagnosis not present

## 2018-01-26 DIAGNOSIS — E1140 Type 2 diabetes mellitus with diabetic neuropathy, unspecified: Secondary | ICD-10-CM | POA: Diagnosis not present

## 2018-01-26 DIAGNOSIS — E1165 Type 2 diabetes mellitus with hyperglycemia: Secondary | ICD-10-CM | POA: Diagnosis not present

## 2018-01-26 DIAGNOSIS — Z794 Long term (current) use of insulin: Secondary | ICD-10-CM | POA: Diagnosis not present

## 2018-01-27 DIAGNOSIS — M170 Bilateral primary osteoarthritis of knee: Secondary | ICD-10-CM | POA: Diagnosis not present

## 2018-02-17 DIAGNOSIS — E871 Hypo-osmolality and hyponatremia: Secondary | ICD-10-CM | POA: Diagnosis not present

## 2018-02-17 DIAGNOSIS — E1142 Type 2 diabetes mellitus with diabetic polyneuropathy: Secondary | ICD-10-CM | POA: Diagnosis not present

## 2018-02-17 DIAGNOSIS — D509 Iron deficiency anemia, unspecified: Secondary | ICD-10-CM | POA: Diagnosis not present

## 2018-02-17 DIAGNOSIS — Z794 Long term (current) use of insulin: Secondary | ICD-10-CM | POA: Diagnosis not present

## 2018-02-17 DIAGNOSIS — Z5181 Encounter for therapeutic drug level monitoring: Secondary | ICD-10-CM | POA: Diagnosis not present

## 2018-02-24 DIAGNOSIS — E871 Hypo-osmolality and hyponatremia: Secondary | ICD-10-CM | POA: Diagnosis not present

## 2018-03-09 DIAGNOSIS — R69 Illness, unspecified: Secondary | ICD-10-CM | POA: Diagnosis not present

## 2018-03-18 ENCOUNTER — Ambulatory Visit: Payer: Medicare HMO | Admitting: Allergy

## 2018-03-18 ENCOUNTER — Encounter: Payer: Self-pay | Admitting: Allergy

## 2018-03-18 VITALS — BP 110/70 | HR 64 | Temp 97.4°F | Resp 18 | Ht 66.1 in | Wt 131.6 lb

## 2018-03-18 DIAGNOSIS — J309 Allergic rhinitis, unspecified: Secondary | ICD-10-CM

## 2018-03-18 DIAGNOSIS — H1010 Acute atopic conjunctivitis, unspecified eye: Secondary | ICD-10-CM | POA: Diagnosis not present

## 2018-03-18 MED ORDER — OLOPATADINE HCL 0.1 % OP SOLN: 1.0000 [drp] | Freq: Two times a day (BID) | OPHTHALMIC | 5 refills | Status: DC

## 2018-03-18 NOTE — Progress Notes (Signed)
New Patient Note  RE: [name] [name] MRN: [id number] DOB: 01/13/1934 Date of Office Visit: 03/18/2018  Referring provider: Lajean Manes, MD Primary care provider: Lajean Manes, MD  Chief Complaint: Allergies  History of present illness: [name] [name] is a 84 y.o. female presenting today for consultation for allergic rhinitis.  She presents today with her daughter.  She states that her ears, nose and head are "clogged up".  Daugther states shes always had allergies and feels that her symptoms have has been worse over the past 3 years ago after she sustained a fall.  Patient complains about nasal bridge hurting, nasal congestion, dry nose as well as itchy/watery eyes.  She denies having any sinus infections.  She was taking claritin for years and daughter feels it is not helpful that she stopped taking this.  She has used Flonase which she feels has been helpful when she uses it. She states she has seen ENT Dr. Ernesto Rutherford as well as an ENT at [facility] Dr. Redmond Baseman with last visit in February 2017.   No history of asthma.  She does have history of eczema with rare flares.   No history of food allergy.    Per EMR she did sustain a fall on November 03, 2013 and per ED note she fell forward tripping over a garden hose and hit her face on the steps of the house.  She was wearing glasses and did sustain a laceration to the bridge of her nose and around her right eye requiring sutures.  She did have a CT head without contrast which was noted to have " Right facial and central forehead subcutaneous hematoma noted. Laceration along the right side of the nose. Minimal chronic right maxillary sinusitis. Subcutaneous hematoma along the forehead. There is laceration along the right side of nose with gas tracking along the bony margin, with a subtle suspected underlying nondisplaced right nasal bone fracture.  Mild chronic right maxillary sinusitis. Right middle turbinate concha bullosa.  There is right  periorbital soft tissue swelling extending along the right facial tissues. None of this extends to the postseptal region; orbital contents appear intact. No orbital fracture is observed. No additional facial fracture."  Review of systems: Review of Systems  Constitutional: Negative for chills, fever and malaise/fatigue.  HENT: Positive for congestion and ear pain. Negative for ear discharge, hearing loss, nosebleeds, sinus pain, sore throat and tinnitus.   Eyes: Negative for pain, discharge and redness.  Respiratory: Negative for cough, shortness of breath and wheezing.   Cardiovascular: Negative for chest pain.  Gastrointestinal: Negative for abdominal pain, constipation, diarrhea, nausea and vomiting.  Musculoskeletal: Positive for joint pain.  Skin: Negative for itching and rash.  Neurological: Negative for headaches.    All other systems negative unless noted above in HPI  Past medical history: Past Medical History:  Diagnosis Date  . Arthritis    "knees, legs" (06/10/2016)  . Ascites   . Chronic kidney disease    "related to my diabetes"  . Chronic lower back pain   . Diabetes mellitus without complication ([place])   . Eczema   . GERD (gastroesophageal reflux disease)   . High cholesterol   . Hyperlipidemia   . Hypertension   . Hypothyroidism   . OAB (overactive bladder)   . Osteoporosis   . Thyroid disease   . Type II diabetes mellitus ([place])   . Urticaria     Past surgical history: Past Surgical History:  Procedure Laterality Date  .  ABDOMINAL HYSTERECTOMY    . KNEE ARTHROSCOPY    . LAPAROTOMY N/A 12/03/2016   Procedure: EXPLORATORY LAPAROTOMY, LYSIS OF ADHESIONS;  Surgeon: Armandina Gemma, MD;  Location: [facility];  Service: General;  Laterality: N/A;  . vocal cord polyps      Family history:  Family History  Problem Relation Age of Onset  . Diabetes Mother   . Hypertension Mother   . Diabetes Sister   . Diabetes Brother   . Asthma Father   . Allergic rhinitis  Neg Hx   . Eczema Neg Hx   . Urticaria Neg Hx     Social history: She lives in a home with carpeting with gas heating and central cooling.  No pets in the home.  There is no concern for water damage, mildew or roaches in the home.  She is retired.  She denies any smoking history.  Medication List: Allergies as of 03/18/2018      Reactions   Lisinopril Cough   Reported by [facility]   Sulfa Antibiotics Hives   Sulfonamide Derivatives Hives   Bactrim [sulfamethoxazole-trimethoprim] Rash   Reported by [facility]      Medication List        Accurate as of 03/18/18 11:46 AM. Always use your most recent med list.          acetaminophen 500 MG tablet Commonly known as:  TYLENOL Take 500 mg by mouth every 6 (six) hours as needed.   alendronate 70 MG tablet Commonly known as:  FOSAMAX Take 1 tablet (70 mg total) every 7 (seven) days by mouth. Take with a full glass of water on an empty stomach.   aspirin EC 81 MG tablet Take 81 mg by mouth daily.   atorvastatin 20 MG tablet Commonly known as:  LIPITOR Take 20 mg by mouth daily.   calcium-vitamin D 250-100 MG-UNIT tablet Take 2 tablets by mouth daily.   cholecalciferol 1000 units tablet Commonly known as:  VITAMIN D Take 1,000 Units by mouth daily.   clindamycin 2 % vaginal cream Commonly known as:  CLEOCIN Place 1 Applicatorful vaginally at bedtime.   cyclobenzaprine 5 MG tablet Commonly known as:  FLEXERIL Take by mouth.   estradiol 0.1 MG/GM vaginal cream Commonly known as:  ESTRACE Place 4.25 Applicatorfuls vaginally 2 (two) times a week. Monday/ Thursday   Fish Oil 1000 MG Caps Take 1,000 mg by mouth 2 (two) times daily.   FLUAD 0.5 ML Susy Generic drug:  Influenza Vac A&B Surf Ant Adj ADM 0.5ML IM UTD   LEVEMIR FLEXTOUCH 100 UNIT/ML Pen Generic drug:  Insulin Detemir Inject 8 Units into the skin 2 (two) times daily.   levothyroxine 100 MCG tablet Commonly known as:  SYNTHROID,  LEVOTHROID Take 100 mcg by mouth daily before breakfast.   losartan 25 MG tablet Commonly known as:  COZAAR Take 25 mg by mouth daily.   mometasone 50 MCG/ACT nasal spray Commonly known as:  NASONEX Place 2 sprays into the nose daily as needed (allergies).   multivitamin with minerals Tabs tablet Take 1 tablet by mouth daily.   NOVOLOG 100 UNIT/ML injection Generic drug:  insulin aspart Inject 6-8 Units into the skin 3 (three) times daily before meals. SLIDING SCALE: 70-199= 6 UNITS 200-299= 7 UNITS >300= 8 UNITS   pantoprazole 40 MG tablet Commonly known as:  PROTONIX Take 40 mg by mouth daily.   polyethylene glycol packet Commonly known as:  MIRALAX / GLYCOLAX Take 17 g by mouth  every Monday, Wednesday, and Friday.       Known medication allergies: Allergies  Allergen Reactions  . Lisinopril Cough    Reported by [facility]  . Sulfa Antibiotics Hives  . Sulfonamide Derivatives Hives  . Bactrim [Sulfamethoxazole-Trimethoprim] Rash    Reported by [facility]     Physical examination: Blood pressure 110/70, pulse 64, temperature (!) 97.4 F (36.3 C), temperature source Oral, resp. rate 18, height 5' 6.1" (1.679 m), weight 131 lb 9.6 oz (59.7 kg), SpO2 96 %.  General: Alert, interactive, in no acute distress. HEENT: PERRLA, TMs pearly gray, turbinates moderately edematous with clear discharge, septum is midline, post-pharynx non erythematous. Neck: Supple without lymphadenopathy. Lungs: Clear to auscultation without wheezing, rhonchi or rales. {no increased work of breathing. CV: Normal S1, S2 without murmurs. Abdomen: Nondistended, nontender. Skin: Warm and dry, without lesions or rashes. Extremities:  No clubbing, cyanosis or edema. Neuro:   Grossly intact.  Diagnositics/Labs:  Allergy testing: Environmental allergy skin prick testing today is positive to Curvularia, dust mites, cockroach. Allergy testing results were read and interpreted by  provider, documented by clinical staff.   Assessment and plan:   Allergic rhinoconjunctivitis -Environmental allergy skin testing today is positive to dust mites, cockroach and mold -Allergen avoidance measures discussed and handouts provided perform avoidance measures especially in the home. -For nasal congestion recommend use of nasal steroid spray like Flonase, Rhinocort or Nasacort 2 sprays each nostril daily.  Use for 1 to 2 weeks at a time before stopping once symptoms improve.  Use with proper nasal spray technique demonstrated today. -For itchy, watery, red eyes recommend use of allergy-based eyedrop, olopatadine 0.1% 1 drop up to twice a day as needed -For general allergy symptom relief recommend trial of either Zyrtec 10 mg daily or Allegra 60mg  twice a day  -Maxillofacial CT scan performed after sustaining fall did not note a nondisplaced nasal fracture that did not require repair.  Follow-up 6 months or sooner if needed  I appreciate the opportunity to take part in Janus's care. Please do not hesitate to contact me with questions.  Sincerely,   Prudy Feeler, MD Allergy/Immunology [facility]

## 2018-03-18 NOTE — Patient Instructions (Addendum)
Allergic rhinoconjunctivitis -Environmental allergy skin testing today is positive to dust mites, cockroach and mold -Allergen avoidance measures discussed and handouts provided perform avoidance measures especially in the home. -For nasal congestion recommend use of nasal steroid spray like Flonase, Rhinocort or Nasacort 2 sprays each nostril daily.  Use for 1 to 2 weeks at a time before stopping once symptoms improve.  Use with proper nasal spray technique demonstrated today. -For itchy, watery, red eyes recommend use of allergy-based eyedrop  -For general allergy symptom relief recommend trial of either Zyrtec 10 mg daily or Allegra 60mg  twice a day  -Maxillofacial CT scan performed after sustaining fall did not note a nondisplaced nasal fracture that did not require repair.  Follow-up 6 months or sooner if needed

## 2018-03-22 DIAGNOSIS — R350 Frequency of micturition: Secondary | ICD-10-CM | POA: Diagnosis not present

## 2018-03-22 DIAGNOSIS — E119 Type 2 diabetes mellitus without complications: Secondary | ICD-10-CM | POA: Diagnosis not present

## 2018-03-24 DIAGNOSIS — K5900 Constipation, unspecified: Secondary | ICD-10-CM | POA: Diagnosis not present

## 2018-03-24 DIAGNOSIS — R109 Unspecified abdominal pain: Secondary | ICD-10-CM | POA: Diagnosis not present

## 2018-04-12 DIAGNOSIS — H5203 Hypermetropia, bilateral: Secondary | ICD-10-CM | POA: Diagnosis not present

## 2018-04-12 DIAGNOSIS — E119 Type 2 diabetes mellitus without complications: Secondary | ICD-10-CM | POA: Diagnosis not present

## 2018-04-12 DIAGNOSIS — Z794 Long term (current) use of insulin: Secondary | ICD-10-CM | POA: Diagnosis not present

## 2018-04-12 DIAGNOSIS — H2513 Age-related nuclear cataract, bilateral: Secondary | ICD-10-CM | POA: Diagnosis not present

## 2018-04-12 DIAGNOSIS — H524 Presbyopia: Secondary | ICD-10-CM | POA: Diagnosis not present

## 2018-04-12 DIAGNOSIS — H25013 Cortical age-related cataract, bilateral: Secondary | ICD-10-CM | POA: Diagnosis not present

## 2018-04-12 DIAGNOSIS — H52202 Unspecified astigmatism, left eye: Secondary | ICD-10-CM | POA: Diagnosis not present

## 2018-05-02 DIAGNOSIS — E1165 Type 2 diabetes mellitus with hyperglycemia: Secondary | ICD-10-CM | POA: Diagnosis not present

## 2018-05-02 DIAGNOSIS — E1140 Type 2 diabetes mellitus with diabetic neuropathy, unspecified: Secondary | ICD-10-CM | POA: Diagnosis not present

## 2018-05-02 DIAGNOSIS — Z794 Long term (current) use of insulin: Secondary | ICD-10-CM | POA: Diagnosis not present

## 2018-05-02 DIAGNOSIS — E119 Type 2 diabetes mellitus without complications: Secondary | ICD-10-CM | POA: Diagnosis not present

## 2018-05-05 DIAGNOSIS — E1142 Type 2 diabetes mellitus with diabetic polyneuropathy: Secondary | ICD-10-CM | POA: Diagnosis not present

## 2018-05-05 DIAGNOSIS — I10 Essential (primary) hypertension: Secondary | ICD-10-CM | POA: Diagnosis not present

## 2018-05-05 DIAGNOSIS — Z23 Encounter for immunization: Secondary | ICD-10-CM | POA: Diagnosis not present

## 2018-05-05 DIAGNOSIS — M810 Age-related osteoporosis without current pathological fracture: Secondary | ICD-10-CM | POA: Diagnosis not present

## 2018-05-05 DIAGNOSIS — L299 Pruritus, unspecified: Secondary | ICD-10-CM | POA: Diagnosis not present

## 2018-05-18 DIAGNOSIS — H2512 Age-related nuclear cataract, left eye: Secondary | ICD-10-CM | POA: Diagnosis not present

## 2018-05-18 DIAGNOSIS — H25012 Cortical age-related cataract, left eye: Secondary | ICD-10-CM | POA: Diagnosis not present

## 2018-05-18 DIAGNOSIS — H25011 Cortical age-related cataract, right eye: Secondary | ICD-10-CM | POA: Diagnosis not present

## 2018-05-18 DIAGNOSIS — H2511 Age-related nuclear cataract, right eye: Secondary | ICD-10-CM | POA: Diagnosis not present

## 2018-05-25 DIAGNOSIS — H2512 Age-related nuclear cataract, left eye: Secondary | ICD-10-CM | POA: Diagnosis not present

## 2018-05-25 DIAGNOSIS — H25012 Cortical age-related cataract, left eye: Secondary | ICD-10-CM | POA: Diagnosis not present

## 2018-05-27 ENCOUNTER — Encounter: Payer: Medicare HMO | Admitting: Obstetrics & Gynecology

## 2018-06-10 DIAGNOSIS — Z794 Long term (current) use of insulin: Secondary | ICD-10-CM | POA: Diagnosis not present

## 2018-06-10 DIAGNOSIS — E871 Hypo-osmolality and hyponatremia: Secondary | ICD-10-CM | POA: Diagnosis not present

## 2018-06-10 DIAGNOSIS — E1142 Type 2 diabetes mellitus with diabetic polyneuropathy: Secondary | ICD-10-CM | POA: Diagnosis not present

## 2018-06-10 DIAGNOSIS — E039 Hypothyroidism, unspecified: Secondary | ICD-10-CM | POA: Diagnosis not present

## 2018-06-10 DIAGNOSIS — Z5181 Encounter for therapeutic drug level monitoring: Secondary | ICD-10-CM | POA: Diagnosis not present

## 2018-06-10 DIAGNOSIS — D509 Iron deficiency anemia, unspecified: Secondary | ICD-10-CM | POA: Diagnosis not present

## 2018-06-23 DIAGNOSIS — Z0100 Encounter for examination of eyes and vision without abnormal findings: Secondary | ICD-10-CM | POA: Diagnosis not present

## 2018-06-23 DIAGNOSIS — H524 Presbyopia: Secondary | ICD-10-CM | POA: Diagnosis not present

## 2018-06-27 ENCOUNTER — Other Ambulatory Visit: Payer: Self-pay | Admitting: Obstetrics & Gynecology

## 2018-07-07 DIAGNOSIS — K5900 Constipation, unspecified: Secondary | ICD-10-CM | POA: Diagnosis not present

## 2018-07-08 DIAGNOSIS — I10 Essential (primary) hypertension: Secondary | ICD-10-CM | POA: Diagnosis not present

## 2018-07-08 DIAGNOSIS — I700 Atherosclerosis of aorta: Secondary | ICD-10-CM | POA: Diagnosis not present

## 2018-07-08 DIAGNOSIS — Z1389 Encounter for screening for other disorder: Secondary | ICD-10-CM | POA: Diagnosis not present

## 2018-07-08 DIAGNOSIS — Z0000 Encounter for general adult medical examination without abnormal findings: Secondary | ICD-10-CM | POA: Diagnosis not present

## 2018-07-08 DIAGNOSIS — E782 Mixed hyperlipidemia: Secondary | ICD-10-CM | POA: Diagnosis not present

## 2018-07-19 ENCOUNTER — Other Ambulatory Visit: Payer: Self-pay | Admitting: Obstetrics & Gynecology

## 2018-07-19 NOTE — Telephone Encounter (Signed)
annual scheduled on 09/15/18

## 2018-07-26 DIAGNOSIS — M5412 Radiculopathy, cervical region: Secondary | ICD-10-CM | POA: Diagnosis not present

## 2018-08-01 DIAGNOSIS — E119 Type 2 diabetes mellitus without complications: Secondary | ICD-10-CM | POA: Diagnosis not present

## 2018-08-01 DIAGNOSIS — E1140 Type 2 diabetes mellitus with diabetic neuropathy, unspecified: Secondary | ICD-10-CM | POA: Diagnosis not present

## 2018-08-01 DIAGNOSIS — Z794 Long term (current) use of insulin: Secondary | ICD-10-CM | POA: Diagnosis not present

## 2018-08-01 DIAGNOSIS — E1165 Type 2 diabetes mellitus with hyperglycemia: Secondary | ICD-10-CM | POA: Diagnosis not present

## 2018-08-05 DIAGNOSIS — M542 Cervicalgia: Secondary | ICD-10-CM | POA: Diagnosis not present

## 2018-08-09 DIAGNOSIS — M5030 Other cervical disc degeneration, unspecified cervical region: Secondary | ICD-10-CM | POA: Diagnosis not present

## 2018-08-11 ENCOUNTER — Telehealth: Payer: Self-pay | Admitting: Nurse Practitioner

## 2018-08-11 ENCOUNTER — Other Ambulatory Visit: Payer: Self-pay | Admitting: Sports Medicine

## 2018-08-11 DIAGNOSIS — M542 Cervicalgia: Secondary | ICD-10-CM

## 2018-08-11 NOTE — Telephone Encounter (Signed)
Phone call to patient to verify medication list and allergies for myelogram procedure. Pt aware she will not need to hold any medications prior to myelogram appointment time.

## 2018-08-19 ENCOUNTER — Ambulatory Visit
Admission: RE | Admit: 2018-08-19 | Discharge: 2018-08-19 | Disposition: A | Discharge status: Home / Self Care | Payer: Medicare HMO | Source: Ambulatory Visit | Attending: Sports Medicine | Admitting: Sports Medicine

## 2018-08-19 DIAGNOSIS — M542 Cervicalgia: Secondary | ICD-10-CM

## 2018-08-19 DIAGNOSIS — M4802 Spinal stenosis, cervical region: Secondary | ICD-10-CM | POA: Diagnosis not present

## 2018-08-19 MED ORDER — ONDANSETRON HCL 4 MG/2ML IJ SOLN
4.0000 mg | Freq: Once | INTRAMUSCULAR | Status: AC
  Administered 2018-08-19: 4 mg via INTRAMUSCULAR

## 2018-08-19 MED ORDER — DIAZEPAM 5 MG PO TABS
5.0000 mg | ORAL_TABLET | Freq: Once | ORAL | Status: AC
  Administered 2018-08-19: 5 mg via ORAL

## 2018-08-19 MED ORDER — IOHEXOL 300 MG/ML  SOLN
10.0000 mL | Freq: Once | INTRAMUSCULAR | Status: AC | PRN
  Administered 2018-08-19: 10 mL via INTRATHECAL

## 2018-08-19 MED ORDER — MEPERIDINE HCL 50 MG/ML IJ SOLN
50.0000 mg | Freq: Once | INTRAMUSCULAR | Status: AC
  Administered 2018-08-19: 50 mg via INTRAMUSCULAR

## 2018-08-19 NOTE — Discharge Instructions (Signed)

## 2018-08-25 DIAGNOSIS — M4802 Spinal stenosis, cervical region: Secondary | ICD-10-CM | POA: Diagnosis not present

## 2018-08-31 DIAGNOSIS — L97512 Non-pressure chronic ulcer of other part of right foot with fat layer exposed: Secondary | ICD-10-CM | POA: Diagnosis not present

## 2018-08-31 DIAGNOSIS — M21611 Bunion of right foot: Secondary | ICD-10-CM | POA: Diagnosis not present

## 2018-09-15 ENCOUNTER — Encounter: Payer: Medicare HMO | Admitting: Obstetrics & Gynecology

## 2018-09-15 DIAGNOSIS — L97512 Non-pressure chronic ulcer of other part of right foot with fat layer exposed: Secondary | ICD-10-CM | POA: Diagnosis not present

## 2018-09-16 ENCOUNTER — Other Ambulatory Visit: Payer: Self-pay

## 2018-09-16 ENCOUNTER — Ambulatory Visit: Payer: Medicare HMO | Admitting: Allergy

## 2018-09-16 DIAGNOSIS — I739 Peripheral vascular disease, unspecified: Secondary | ICD-10-CM

## 2018-09-19 DIAGNOSIS — M170 Bilateral primary osteoarthritis of knee: Secondary | ICD-10-CM | POA: Diagnosis not present

## 2018-09-20 DIAGNOSIS — Z794 Long term (current) use of insulin: Secondary | ICD-10-CM | POA: Diagnosis not present

## 2018-09-20 DIAGNOSIS — Z79899 Other long term (current) drug therapy: Secondary | ICD-10-CM | POA: Diagnosis not present

## 2018-09-20 DIAGNOSIS — L97509 Non-pressure chronic ulcer of other part of unspecified foot with unspecified severity: Secondary | ICD-10-CM | POA: Diagnosis not present

## 2018-09-20 DIAGNOSIS — E1142 Type 2 diabetes mellitus with diabetic polyneuropathy: Secondary | ICD-10-CM | POA: Diagnosis not present

## 2018-09-20 DIAGNOSIS — E1165 Type 2 diabetes mellitus with hyperglycemia: Secondary | ICD-10-CM | POA: Diagnosis not present

## 2018-09-20 DIAGNOSIS — E039 Hypothyroidism, unspecified: Secondary | ICD-10-CM | POA: Diagnosis not present

## 2018-09-21 ENCOUNTER — Ambulatory Visit: Payer: Medicare HMO | Admitting: Obstetrics & Gynecology

## 2018-09-21 ENCOUNTER — Encounter: Payer: Self-pay | Admitting: Obstetrics & Gynecology

## 2018-09-21 VITALS — BP 140/76 | Ht 65.0 in | Wt 137.2 lb

## 2018-09-21 DIAGNOSIS — Z780 Asymptomatic menopausal state: Secondary | ICD-10-CM

## 2018-09-21 DIAGNOSIS — N952 Postmenopausal atrophic vaginitis: Secondary | ICD-10-CM | POA: Diagnosis not present

## 2018-09-21 DIAGNOSIS — Z01419 Encounter for gynecological examination (general) (routine) without abnormal findings: Secondary | ICD-10-CM | POA: Diagnosis not present

## 2018-09-21 DIAGNOSIS — Z90710 Acquired absence of both cervix and uterus: Secondary | ICD-10-CM

## 2018-09-21 DIAGNOSIS — M8589 Other specified disorders of bone density and structure, multiple sites: Secondary | ICD-10-CM

## 2018-09-21 MED ORDER — ESTRADIOL 0.1 MG/GM VA CREA: TOPICAL_CREAM | VAGINAL | 3 refills | Status: DC

## 2018-09-21 NOTE — Progress Notes (Signed)
[name] [name] 1933/09/04 [id number]   History:    85 y.o. [id number]   RP:  Established patient presenting for annual gyn exam   HPI: Status post total hysterectomy.  No pelvic pain currently.  Occasional suprapubic discomfort unchanged since last year.  Abstinent.  Urinary frequency unchanged.  Bowel movements normal.  Breasts normal.  Body mass index 22.83.  Patient keeping active.  Health labs with family physician.  Past medical history,surgical history, family history and social history were all reviewed and documented in the EPIC chart.  Gynecologic History No LMP recorded. Patient has had a hysterectomy. Contraception: status post hysterectomy Last Pap: 04/2017. Results were: Negative Last mammogram: 12/2017. Results were: Negative Bone Density: 05/2017 Osteopenia Colonoscopy: 4 yrs ago  Obstetric History OB History  Gravida Para Term Preterm AB Living  5 5 0 0 0 4  SAB TAB Ectopic Multiple Live Births  0 0 0        # Outcome Date GA Lbr Len/2nd Weight Sex Delivery Anes PTL Lv  5 Para           4 Para           3 Para           2 Para           1 Para             Obstetric Comments  1 son passed away      ROS: A ROS was performed and pertinent positives and negatives are included in the history.  GENERAL: No fevers or chills. HEENT: No change in vision, no earache, sore throat or sinus congestion. NECK: No pain or stiffness. CARDIOVASCULAR: No chest pain or pressure. No palpitations. PULMONARY: No shortness of breath, cough or wheeze. GASTROINTESTINAL: No abdominal pain, nausea, vomiting or diarrhea, melena or bright red blood per rectum. GENITOURINARY: No urinary frequency, urgency, hesitancy or dysuria. MUSCULOSKELETAL: No joint or muscle pain, no back pain, no recent trauma. DERMATOLOGIC: No rash, no itching, no lesions. ENDOCRINE: No polyuria, polydipsia, no heat or cold intolerance. No recent change in weight. HEMATOLOGICAL: No anemia or easy bruising or bleeding.  NEUROLOGIC: No headache, seizures, numbness, tingling or weakness. PSYCHIATRIC: No depression, no loss of interest in normal activity or change in sleep pattern.     Exam:   BP 140/76   Ht 5\' 5"  (1.651 m)   Wt 137 lb 3.2 oz (62.2 kg)   BMI 22.83 kg/m   Body mass index is 22.83 kg/m.  General appearance : Well developed well nourished female. No acute distress HEENT: Eyes: no retinal hemorrhage or exudates,  Neck supple, trachea midline, no carotid bruits, no thyroidmegaly Lungs: Clear to auscultation, no rhonchi or wheezes, or rib retractions  Heart: Regular rate and rhythm, no murmurs or gallops Breast:Examined in sitting and supine position were symmetrical in appearance, no palpable masses or tenderness,  no skin retraction, no nipple inversion, no nipple discharge, no skin discoloration, no axillary or supraclavicular lymphadenopathy Abdomen: no palpable masses or tenderness, no rebound or guarding Extremities: no edema or skin discoloration or tenderness  Pelvic: Vulva: Normal             Vagina: No gross lesions or discharge  Cervix/Uterus absent  Adnexa  Without masses or tenderness  Anus: Normal   Assessment/Plan:  85 y.o. female for annual exam   1. Well female exam with routine gynecological exam Gynecologic exam s/p total hysterectomy in postmenopause.  Last Pap test  negative 04/2017, no indication for further pap tests.  Breast exam normal.  Last screening Mammo 12/2017 negative.  Health labs with Fam MD.  2. S/P total hysterectomy  3. Postmenopausal Well on no HRT.    4. Postmenopausal atrophic vaginitis Estrace cream represcribed at patient's request.  Will use 1/4 applicator vaginally twice a week.  Prescription sent to pharmacy.  5. Osteopenia of multiple sites Osteopenia on BD 05/2017.  Continue with Vit. D supplement, Ca++ 1.2-1.5 g/day and regular weight bearing physical activity.  Other orders - estradiol (ESTRACE) 0.1 MG/GM vaginal cream; INSERT 1/4  APPLICATORFUL VAGINALLY TWICE WEEKLY( MONDAY AND THURSDAY)  Princess Bruins MD, 10:23 AM 09/21/2018

## 2018-09-22 ENCOUNTER — Encounter: Payer: Self-pay | Admitting: Obstetrics & Gynecology

## 2018-09-22 NOTE — Patient Instructions (Signed)
1. Well female exam with routine gynecological exam Gynecologic exam s/p total hysterectomy in postmenopause.  Last Pap test negative 04/2017, no indication for further pap tests.  Breast exam normal.  Last screening Mammo 12/2017 negative.  Health labs with Fam MD.  2. S/P total hysterectomy  3. Postmenopausal Well on no HRT.    4. Postmenopausal atrophic vaginitis Estrace cream represcribed at patient's request.  Will use 1/4 applicator vaginally twice a week.  Prescription sent to pharmacy.  5. Osteopenia of multiple sites Osteopenia on BD 05/2017.  Continue with Vit. D supplement, Ca++ 1.2-1.5 g/day and regular weight bearing physical activity.  Other orders - estradiol (ESTRACE) 0.1 MG/GM vaginal cream; INSERT 1/4 APPLICATORFUL VAGINALLY TWICE WEEKLY( [place])  [place], it was a pleasure seeing you today!

## 2018-09-26 ENCOUNTER — Encounter: Payer: Self-pay | Admitting: Vascular Surgery

## 2018-09-26 ENCOUNTER — Ambulatory Visit: Payer: Medicare HMO | Admitting: Vascular Surgery

## 2018-09-26 ENCOUNTER — Ambulatory Visit (HOSPITAL_COMMUNITY)
Admission: RE | Admit: 2018-09-26 | Discharge: 2018-09-26 | Disposition: A | Discharge status: Home / Self Care | Payer: Medicare HMO | Source: Ambulatory Visit | Attending: Vascular Surgery | Admitting: Vascular Surgery

## 2018-09-26 VITALS — BP 158/74 | HR 73 | Temp 96.8°F | Resp 18 | Wt 133.4 lb

## 2018-09-26 DIAGNOSIS — I739 Peripheral vascular disease, unspecified: Secondary | ICD-10-CM | POA: Diagnosis not present

## 2018-09-26 DIAGNOSIS — L97519 Non-pressure chronic ulcer of other part of right foot with unspecified severity: Secondary | ICD-10-CM | POA: Diagnosis not present

## 2018-09-26 NOTE — Progress Notes (Signed)
[facility] and [facility] Specialist of [facility] office  Patient name: [name] [name] MRN: [id number] DOB: 07/11/1934 Sex: female  REASON FOR CONSULT: Evaluate arterial flow to right lower extremity  HPI: [name] [name] is a 85 y.o. female, who is here today for evaluation.  She is a very pleasant 85 year old female who is here today with her daughter.  She has a several week history of ulceration on the medial aspect of her right second toe.  It appears as though this may have rubbed against her great toe.  She initially had what sounds like a blister and now an ulceration over this area.  She is seen today for evaluation of arterial flow.  She has no prior history of ulceration.  Not have any history of arterial rest pain.  She does have what sounds like arthritis in her knees and reports that she has stiffness when she first awakens in the morning  Past Medical History:  Diagnosis Date  . Arthritis    "knees, legs" (06/10/2016)  . Ascites   . Chronic kidney disease    "related to my diabetes"  . Chronic lower back pain   . Diabetes mellitus without complication ([place])   . Eczema   . GERD (gastroesophageal reflux disease)   . High cholesterol   . Hyperlipidemia   . Hypertension   . Hypothyroidism   . OAB (overactive bladder)   . Osteoporosis   . Thyroid disease   . Type II diabetes mellitus ([place])   . Urticaria     Family History  Problem Relation Age of Onset  . Diabetes Mother   . Hypertension Mother   . Diabetes Sister   . Diabetes Brother   . Asthma Father   . Allergic rhinitis Neg Hx   . Eczema Neg Hx   . Urticaria Neg Hx     SOCIAL HISTORY: Social History   Socioeconomic History  . Marital status: Divorced    Spouse name: Not on file  . Number of children: Not on file  . Years of education: Not on file  . Highest education level: Not on file  Occupational History  . Not on file  Social Needs  . Financial  resource strain: Not on file  . Food insecurity:    Worry: Not on file    Inability: Not on file  . Transportation needs:    Medical: Not on file    Non-medical: Not on file  Tobacco Use  . Smoking status: Never Smoker  . Smokeless tobacco: Former Systems developer    Types: Chew  Substance and Sexual Activity  . Alcohol use: No  . Drug use: No  . Sexual activity: Never  Lifestyle  . Physical activity:    Days per week: Not on file    Minutes per session: Not on file  . Stress: Not on file  Relationships  . Social connections:    Talks on phone: Not on file    Gets together: Not on file    Attends religious service: Not on file    Active member of club or organization: Not on file    Attends meetings of clubs or organizations: Not on file    Relationship status: Not on file  . Intimate partner violence:    Fear of current or ex partner: Not on file    Emotionally abused: Not on file    Physically abused: Not on file    Forced sexual activity: Not on file  Other Topics Concern  . Not on file  Social History Narrative   ** Merged History Encounter **        Allergies  Allergen Reactions  . Sulfonamide Derivatives Hives  . Bactrim [Sulfamethoxazole-Trimethoprim] Rash    Reported by [facility]  . Lisinopril Cough    Reported by [facility]    Current Outpatient Medications  Medication Sig Dispense Refill  . acetaminophen (TYLENOL) 500 MG tablet Take 500 mg by mouth every 6 (six) hours as needed.    Marland Kitchen alendronate (FOSAMAX) 70 MG tablet TAKE 1 TABLET BY MOUTH EVERY 7 DAYS ON AN EMPTY STOMACH WITH A FULL GLASS OF WATER 12 tablet 0  . aspirin EC 81 MG tablet Take 81 mg by mouth daily.    Marland Kitchen atorvastatin (LIPITOR) 20 MG tablet Take 20 mg by mouth daily.    . calcium-vitamin D 250-100 MG-UNIT tablet Take 2 tablets by mouth daily.    . cholecalciferol (VITAMIN D) 1000 units tablet Take 1,000 Units by mouth daily.    Marland Kitchen estradiol (ESTRACE) 0.1 MG/GM vaginal cream INSERT 1/4  APPLICATORFUL VAGINALLY TWICE WEEKLY( MONDAY AND THURSDAY) 42.5 g 3  . FLUAD 0.5 ML SUSY ADM 0.5ML IM UTD  0  . insulin aspart (NOVOLOG) 100 UNIT/ML injection Inject 6-8 Units into the skin 3 (three) times daily before meals. SLIDING SCALE: 70-199= 6 UNITS 200-299= 7 UNITS >300= 8 UNITS    . Insulin Detemir (LEVEMIR FLEXTOUCH) 100 UNIT/ML Pen Inject 7 Units into the skin 2 (two) times daily.     Marland Kitchen levothyroxine (SYNTHROID, LEVOTHROID) 100 MCG tablet Take 100 mcg by mouth daily before breakfast.    . losartan (COZAAR) 25 MG tablet Take 25 mg by mouth daily.    . mometasone (NASONEX) 50 MCG/ACT nasal spray Place 2 sprays into the nose daily as needed (allergies).    . Multiple Vitamin (MULTIVITAMIN WITH MINERALS) TABS tablet Take 1 tablet by mouth daily.    Marland Kitchen olopatadine (PATANOL) 0.1 % ophthalmic solution Place 1 drop into both eyes 2 (two) times daily. 5 mL 5  . Omega-3 Fatty Acids (FISH OIL) 1000 MG CAPS Take 1,000 mg by mouth 2 (two) times daily.    . polyethylene glycol (MIRALAX / GLYCOLAX) packet Take 17 g by mouth every Monday, Wednesday, and Friday.     No current facility-administered medications for this visit.     REVIEW OF SYSTEMS:  [X]  denotes positive finding, [ ]  denotes negative finding Cardiac  Comments:  Chest pain or chest pressure:    Shortness of breath upon exertion:    Short of breath when lying flat:    Irregular heart rhythm:        Vascular    Pain in calf, thigh, or hip brought on by ambulation:    Pain in feet at night that wakes you up from your sleep:     Blood clot in your veins:    Leg swelling:         Pulmonary    Oxygen at home:    Productive cough:     Wheezing:         Neurologic    Sudden weakness in arms or legs:     Sudden numbness in arms or legs:     Sudden onset of difficulty speaking or slurred speech:    Temporary loss of vision in one eye:     Problems with dizziness:         Gastrointestinal  Blood in stool:     Vomited  blood:         Genitourinary    Burning when urinating:     Blood in urine:        Psychiatric    Major depression:         Hematologic    Bleeding problems:    Problems with blood clotting too easily:        Skin    Rashes or ulcers:        Constitutional    Fever or chills:      PHYSICAL EXAM: Vitals:   09/26/18 1050  BP: (!) 158/74  Pulse: 73  Resp: 18  Temp: (!) 96.8 F (36 C)  TempSrc: Temporal  Weight: 133 lb 6.4 oz (60.5 kg)    GENERAL: The patient is a well-nourished female, in no acute distress. The vital signs are documented above. CARDIOVASCULAR: Carotid arteries without bruits bilaterally.  2+ radial 2+ femoral and 2+ popliteal pulses.  1+ dorsalis pedis pulses bilaterally PULMONARY: There is good air exchange  ABDOMEN: Soft and non-tender  MUSCULOSKELETAL: There are no major deformities or cyanosis. NEUROLOGIC: No focal weakness or paresthesias are detected. SKIN: There is a 2 to 3 mm ulceration over the aspect of the left second toe.  Punched-out borders with no surrounding erythema PSYCHIATRIC: The patient has a normal affect.  DATA:  Noninvasive studies were reviewed with the patient.  Ankle arm index is normal at 1.0 on the right and diminished on the left at 0.67.  I did listen to her arteries with hand-held Doppler and she does have good flow at the dorsalis pedis and posterior level bilaterally  MEDICAL ISSUES: No evidence of significant arterial insufficiency in the right lower extremity.  Moderate insufficiency on the left leg by duplex but she does have a palpable dorsalis pedis pulse.  I have reassured the patient and her daughter present.  Would continue treatment of her ulceration as is currently being done by Dr.Petery.  She will follow-up with [place] on an as-needed basis   Rosetta Posner, MD [facility] of [facility] Tel [phone] Pager [phone]

## 2018-09-30 DIAGNOSIS — L97512 Non-pressure chronic ulcer of other part of right foot with fat layer exposed: Secondary | ICD-10-CM | POA: Diagnosis not present

## 2018-10-12 ENCOUNTER — Ambulatory Visit: Payer: Medicare HMO | Admitting: Allergy

## 2018-10-28 DIAGNOSIS — Z794 Long term (current) use of insulin: Secondary | ICD-10-CM | POA: Diagnosis not present

## 2018-10-28 DIAGNOSIS — E1140 Type 2 diabetes mellitus with diabetic neuropathy, unspecified: Secondary | ICD-10-CM | POA: Diagnosis not present

## 2018-10-28 DIAGNOSIS — E1165 Type 2 diabetes mellitus with hyperglycemia: Secondary | ICD-10-CM | POA: Diagnosis not present

## 2018-10-28 DIAGNOSIS — E119 Type 2 diabetes mellitus without complications: Secondary | ICD-10-CM | POA: Diagnosis not present

## 2018-11-28 DIAGNOSIS — E1140 Type 2 diabetes mellitus with diabetic neuropathy, unspecified: Secondary | ICD-10-CM | POA: Diagnosis not present

## 2018-11-28 DIAGNOSIS — D509 Iron deficiency anemia, unspecified: Secondary | ICD-10-CM | POA: Diagnosis not present

## 2018-11-28 DIAGNOSIS — I10 Essential (primary) hypertension: Secondary | ICD-10-CM | POA: Diagnosis not present

## 2018-11-28 DIAGNOSIS — E039 Hypothyroidism, unspecified: Secondary | ICD-10-CM | POA: Diagnosis not present

## 2018-11-28 DIAGNOSIS — N189 Chronic kidney disease, unspecified: Secondary | ICD-10-CM | POA: Diagnosis not present

## 2018-11-28 DIAGNOSIS — E119 Type 2 diabetes mellitus without complications: Secondary | ICD-10-CM | POA: Diagnosis not present

## 2018-11-28 DIAGNOSIS — E1165 Type 2 diabetes mellitus with hyperglycemia: Secondary | ICD-10-CM | POA: Diagnosis not present

## 2018-11-28 DIAGNOSIS — E1142 Type 2 diabetes mellitus with diabetic polyneuropathy: Secondary | ICD-10-CM | POA: Diagnosis not present

## 2018-11-28 DIAGNOSIS — E782 Mixed hyperlipidemia: Secondary | ICD-10-CM | POA: Diagnosis not present

## 2018-11-28 DIAGNOSIS — M810 Age-related osteoporosis without current pathological fracture: Secondary | ICD-10-CM | POA: Diagnosis not present

## 2018-12-14 ENCOUNTER — Ambulatory Visit: Payer: Medicare HMO | Admitting: Allergy

## 2018-12-22 DIAGNOSIS — Z794 Long term (current) use of insulin: Secondary | ICD-10-CM | POA: Diagnosis not present

## 2018-12-22 DIAGNOSIS — E1142 Type 2 diabetes mellitus with diabetic polyneuropathy: Secondary | ICD-10-CM | POA: Diagnosis not present

## 2018-12-22 DIAGNOSIS — E039 Hypothyroidism, unspecified: Secondary | ICD-10-CM | POA: Diagnosis not present

## 2018-12-22 DIAGNOSIS — Z5181 Encounter for therapeutic drug level monitoring: Secondary | ICD-10-CM | POA: Diagnosis not present

## 2018-12-23 DIAGNOSIS — L97512 Non-pressure chronic ulcer of other part of right foot with fat layer exposed: Secondary | ICD-10-CM | POA: Diagnosis not present

## 2019-01-05 DIAGNOSIS — Z961 Presence of intraocular lens: Secondary | ICD-10-CM | POA: Diagnosis not present

## 2019-01-10 DIAGNOSIS — E1142 Type 2 diabetes mellitus with diabetic polyneuropathy: Secondary | ICD-10-CM | POA: Diagnosis not present

## 2019-01-10 DIAGNOSIS — I10 Essential (primary) hypertension: Secondary | ICD-10-CM | POA: Diagnosis not present

## 2019-01-10 DIAGNOSIS — M79606 Pain in leg, unspecified: Secondary | ICD-10-CM | POA: Diagnosis not present

## 2019-01-10 DIAGNOSIS — I700 Atherosclerosis of aorta: Secondary | ICD-10-CM | POA: Diagnosis not present

## 2019-01-10 DIAGNOSIS — E782 Mixed hyperlipidemia: Secondary | ICD-10-CM | POA: Diagnosis not present

## 2019-01-11 ENCOUNTER — Ambulatory Visit: Payer: Medicare HMO | Admitting: Allergy

## 2019-01-18 DIAGNOSIS — L97511 Non-pressure chronic ulcer of other part of right foot limited to breakdown of skin: Secondary | ICD-10-CM | POA: Diagnosis not present

## 2019-02-13 DIAGNOSIS — E1142 Type 2 diabetes mellitus with diabetic polyneuropathy: Secondary | ICD-10-CM | POA: Diagnosis not present

## 2019-02-13 DIAGNOSIS — E039 Hypothyroidism, unspecified: Secondary | ICD-10-CM | POA: Diagnosis not present

## 2019-02-13 DIAGNOSIS — E1165 Type 2 diabetes mellitus with hyperglycemia: Secondary | ICD-10-CM | POA: Diagnosis not present

## 2019-03-07 DIAGNOSIS — Z5181 Encounter for therapeutic drug level monitoring: Secondary | ICD-10-CM | POA: Diagnosis not present

## 2019-03-07 DIAGNOSIS — Z794 Long term (current) use of insulin: Secondary | ICD-10-CM | POA: Diagnosis not present

## 2019-03-07 DIAGNOSIS — E1142 Type 2 diabetes mellitus with diabetic polyneuropathy: Secondary | ICD-10-CM | POA: Diagnosis not present

## 2019-03-07 DIAGNOSIS — E039 Hypothyroidism, unspecified: Secondary | ICD-10-CM | POA: Diagnosis not present

## 2019-03-10 DIAGNOSIS — I10 Essential (primary) hypertension: Secondary | ICD-10-CM | POA: Diagnosis not present

## 2019-03-10 DIAGNOSIS — E1140 Type 2 diabetes mellitus with diabetic neuropathy, unspecified: Secondary | ICD-10-CM | POA: Diagnosis not present

## 2019-03-10 DIAGNOSIS — E1165 Type 2 diabetes mellitus with hyperglycemia: Secondary | ICD-10-CM | POA: Diagnosis not present

## 2019-03-10 DIAGNOSIS — E782 Mixed hyperlipidemia: Secondary | ICD-10-CM | POA: Diagnosis not present

## 2019-03-10 DIAGNOSIS — N189 Chronic kidney disease, unspecified: Secondary | ICD-10-CM | POA: Diagnosis not present

## 2019-03-10 DIAGNOSIS — M810 Age-related osteoporosis without current pathological fracture: Secondary | ICD-10-CM | POA: Diagnosis not present

## 2019-03-10 DIAGNOSIS — E039 Hypothyroidism, unspecified: Secondary | ICD-10-CM | POA: Diagnosis not present

## 2019-03-10 DIAGNOSIS — E1142 Type 2 diabetes mellitus with diabetic polyneuropathy: Secondary | ICD-10-CM | POA: Diagnosis not present

## 2019-03-10 DIAGNOSIS — D509 Iron deficiency anemia, unspecified: Secondary | ICD-10-CM | POA: Diagnosis not present

## 2019-03-10 DIAGNOSIS — E119 Type 2 diabetes mellitus without complications: Secondary | ICD-10-CM | POA: Diagnosis not present

## 2019-03-18 DIAGNOSIS — R69 Illness, unspecified: Secondary | ICD-10-CM | POA: Diagnosis not present

## 2019-03-30 DIAGNOSIS — E1165 Type 2 diabetes mellitus with hyperglycemia: Secondary | ICD-10-CM | POA: Diagnosis not present

## 2019-03-30 DIAGNOSIS — Z794 Long term (current) use of insulin: Secondary | ICD-10-CM | POA: Diagnosis not present

## 2019-03-30 DIAGNOSIS — M170 Bilateral primary osteoarthritis of knee: Secondary | ICD-10-CM | POA: Diagnosis not present

## 2019-03-30 DIAGNOSIS — E1140 Type 2 diabetes mellitus with diabetic neuropathy, unspecified: Secondary | ICD-10-CM | POA: Diagnosis not present

## 2019-03-30 DIAGNOSIS — E119 Type 2 diabetes mellitus without complications: Secondary | ICD-10-CM | POA: Diagnosis not present

## 2019-04-03 ENCOUNTER — Telehealth: Payer: Self-pay | Admitting: Allergy

## 2019-04-03 NOTE — Telephone Encounter (Signed)
Patient called and said her ears, nose and head are all stopped up.

## 2019-04-03 NOTE — Telephone Encounter (Signed)
Patient is taking Allegra for her allergies and no nose sprays

## 2019-04-03 NOTE — Telephone Encounter (Signed)
Patient is scheduled to see Dr. Nelva Bush at 11:20 am on 9/16

## 2019-04-04 NOTE — Telephone Encounter (Signed)
Ok thanks will see her tomorrow.   Can you put her on the schedule for that time as it is not showing up for me.

## 2019-04-04 NOTE — Telephone Encounter (Signed)
Appointment scheduled for 04-13-2019. I have moved the appointment to 04-05-2019.

## 2019-04-05 ENCOUNTER — Other Ambulatory Visit: Payer: Self-pay

## 2019-04-05 ENCOUNTER — Encounter: Payer: Self-pay | Admitting: Allergy

## 2019-04-05 ENCOUNTER — Ambulatory Visit: Payer: Medicare HMO | Admitting: Allergy

## 2019-04-05 VITALS — BP 172/70 | Temp 97.3°F | Resp 16 | Ht 65.5 in | Wt 142.0 lb

## 2019-04-05 DIAGNOSIS — H1013 Acute atopic conjunctivitis, bilateral: Secondary | ICD-10-CM

## 2019-04-05 DIAGNOSIS — J3089 Other allergic rhinitis: Secondary | ICD-10-CM

## 2019-04-05 DIAGNOSIS — M4802 Spinal stenosis, cervical region: Secondary | ICD-10-CM | POA: Diagnosis not present

## 2019-04-05 MED ORDER — MOMETASONE FUROATE 50 MCG/ACT NA SUSP: 2.0000 | Freq: Every day | NASAL | 5 refills | Status: DC | PRN

## 2019-04-05 MED ORDER — OLOPATADINE HCL 0.1 % OP SOLN: 1.0000 [drp] | Freq: Two times a day (BID) | OPHTHALMIC | 5 refills | Status: DC

## 2019-04-05 NOTE — Progress Notes (Signed)
Follow-up Note  RE: [name] [name] MRN: [id number] DOB: 1933-12-31 Date of Office Visit: 04/05/2019   History of present illness: [name] [name] is a 85 y.o. female presenting today for follow-up of allergic rhinitis with conjunctivitis.  She was last seen in the office on 03/18/18 by myself. She states she has been doing relatively well over the past year without any major health changes, surgeries or hospitalizations. She states that this summer she has been having more nasal congestion primarily at bedtime.  She gets stopped up and states that she has some pressure in her cheeks.  She is using Nasonex 2 sprays each nostril but is only using it when the congestion is a more severe thus not using on a daily basis at this time.  She states she is taking Zyrtec daily however.  She has not tried use of a nasal saline rinse.  She denies any fevers.  Review of systems: Review of Systems  Constitutional: Negative for chills, fever and malaise/fatigue.  HENT: Positive for congestion and sinus pain. Negative for ear discharge, ear pain, nosebleeds and sore throat.   Eyes: Negative for pain, discharge and redness.  Respiratory: Negative for cough, shortness of breath and wheezing.   Cardiovascular: Negative for chest pain.  Gastrointestinal: Negative for abdominal pain, constipation, diarrhea, heartburn, nausea and vomiting.  Musculoskeletal: Negative for joint pain.  Skin: Negative for itching and rash.  Neurological: Negative for headaches.    All other systems negative unless noted above in HPI  Past medical/social/surgical/family history have been reviewed and are unchanged unless specifically indicated below.  No changes  Medication List: Allergies as of 04/05/2019      Reactions   Sulfonamide Derivatives Hives   Bactrim [sulfamethoxazole-trimethoprim] Rash   Reported by [facility]   Lisinopril Cough   Reported by [facility]      Medication List       Accurate  as of April 05, 2019 12:08 PM. If you have any questions, ask your nurse or doctor.        acetaminophen 500 MG tablet Commonly known as: TYLENOL Take 500 mg by mouth every 6 (six) hours as needed.   alendronate 70 MG tablet Commonly known as: FOSAMAX TAKE 1 TABLET BY MOUTH EVERY 7 DAYS ON AN EMPTY STOMACH WITH A FULL GLASS OF WATER   aspirin EC 81 MG tablet Take 81 mg by mouth daily.   atorvastatin 40 MG tablet Commonly known as: LIPITOR TK 1 T PO QD FOR CHOLESTEROL What changed: Another medication with the same name was removed. Continue taking this medication, and follow the directions you see here. Changed by: Lameshia Hypolite Charmian Muff, MD   B-D UF III MINI PEN NEEDLES 31G X 5 MM Misc Generic drug: Insulin Pen Needle USE AS DIRECTED TID   calcium-vitamin D 250-100 MG-UNIT tablet Take 2 tablets by mouth daily.   cholecalciferol 1000 units tablet Commonly known as: VITAMIN D Take 1,000 Units by mouth daily.   estradiol 0.1 MG/GM vaginal cream Commonly known as: ESTRACE INSERT 1/4 APPLICATORFUL VAGINALLY TWICE WEEKLY( MONDAY AND THURSDAY)   Fish Oil 1000 MG Caps Take 1,000 mg by mouth 2 (two) times daily.   Fluad 0.5 ML Susy Generic drug: Influenza Vac A&B Surf Ant Adj ADM 0.5ML IM UTD   Fusion Plus Caps TK 1 C PO QD   Levemir FlexTouch 100 UNIT/ML Pen Generic drug: Insulin Detemir Inject 7 Units into the skin 2 (two) times daily.   levothyroxine 100  MCG tablet Commonly known as: SYNTHROID Take 100 mcg by mouth daily before breakfast. What changed: Another medication with the same name was removed. Continue taking this medication, and follow the directions you see here. Changed by: Tanasia Budzinski Charmian Muff, MD   losartan 50 MG tablet Commonly known as: COZAAR TK 1 T PO QD What changed: Another medication with the same name was removed. Continue taking this medication, and follow the directions you see here. Changed by: Nareg Breighner Charmian Muff, MD    mometasone 50 MCG/ACT nasal spray Commonly known as: NASONEX Place 2 sprays into the nose daily as needed (allergies).   multivitamin with minerals Tabs tablet Take 1 tablet by mouth daily.   NovoLOG 100 UNIT/ML injection Generic drug: insulin aspart Inject 6-8 Units into the skin 3 (three) times daily before meals. SLIDING SCALE: 70-199= 6 UNITS 200-299= 7 UNITS >300= 8 UNITS   olopatadine 0.1 % ophthalmic solution Commonly known as: PATANOL Place 1 drop into both eyes 2 (two) times daily.   pantoprazole 40 MG tablet Commonly known as: PROTONIX TK 1 T PO QD   polyethylene glycol 17 g packet Commonly known as: MIRALAX / GLYCOLAX Take 17 g by mouth every Monday, Wednesday, and Friday.       Known medication allergies: Allergies  Allergen Reactions  . Sulfonamide Derivatives Hives  . Bactrim [Sulfamethoxazole-Trimethoprim] Rash    Reported by [facility]  . Lisinopril Cough    Reported by [facility]     Physical examination: Blood pressure (!) 172/70, temperature (!) 97.3 F (36.3 C), temperature source Temporal, resp. rate 16, height 5' 5.5" (1.664 m), weight 142 lb (64.4 kg), SpO2 99 %.  General: Alert, interactive, in no acute distress. HEENT: PERRLA, TMs pearly gray, turbinates moderately edematous without discharge, post-pharynx non erythematous. Neck: Supple without lymphadenopathy. Lungs: Clear to auscultation without wheezing, rhonchi or rales. {no increased work of breathing. CV: Normal S1, S2 without murmurs. Abdomen: Nondistended, nontender. Skin: Warm and dry, without lesions or rashes. Extremities:  No clubbing, cyanosis or edema. Neuro:   Grossly intact.  Diagnositics/Labs: None today  Assessment and plan:   Allergic rhinitis with conjunctivitis -Continue avoidance measures for dust mites, cockroach and mold - start nasal saline rinse before bedtime to help clean/flush out sinuses.  Rinse kit provided.  Use either distilled water or  boil water and bring to room temperature prior to use.  Then follow with your Nasonex.   -For nasal congestion continue use of nasal steroid spray Nasonex 2 sprays each nostril daily before bed.  Use with proper nasal spray technique demonstrated today. -For itchy, watery, red eyes recommend use of allergy-based eyedrop, Olopatadine 0.1% 1 drop up to twice a day as needed -For general allergy symptom relief continue Zyrtec 10 mg daily   Follow-up 6 months or sooner if needed  I appreciate the opportunity to take part in [name] [name]'s care. Please do not hesitate to contact me with questions.  Sincerely,   Prudy Feeler, MD Allergy/Immunology [facility]

## 2019-04-05 NOTE — Patient Instructions (Addendum)
Allergic rhinitis with conjunctivitis -Continue avoidance measures for dust mites, cockroach and mold - start nasal saline rinse before bedtime to help clean/flush out sinuses.  Rinse kit provided.  Use either distilled water or boil water and bring to room temperature prior to use.  Then follow with your Nasonex.   -For nasal congestion continue use of nasal steroid spray Nasonex 2 sprays each nostril daily before bed.  Use with proper nasal spray technique demonstrated today. -For itchy, watery, red eyes recommend use of allergy-based eyedrop, Olopatadine 0.1% 1 drop up to twice a day as needed -For general allergy symptom relief continue Zyrtec 10 mg daily    Follow-up 6 months or sooner if needed

## 2019-04-13 ENCOUNTER — Ambulatory Visit: Payer: Self-pay | Admitting: Allergy

## 2019-04-19 DIAGNOSIS — E039 Hypothyroidism, unspecified: Secondary | ICD-10-CM | POA: Diagnosis not present

## 2019-04-19 DIAGNOSIS — E119 Type 2 diabetes mellitus without complications: Secondary | ICD-10-CM | POA: Diagnosis not present

## 2019-04-19 DIAGNOSIS — N189 Chronic kidney disease, unspecified: Secondary | ICD-10-CM | POA: Diagnosis not present

## 2019-04-19 DIAGNOSIS — E782 Mixed hyperlipidemia: Secondary | ICD-10-CM | POA: Diagnosis not present

## 2019-04-19 DIAGNOSIS — E1140 Type 2 diabetes mellitus with diabetic neuropathy, unspecified: Secondary | ICD-10-CM | POA: Diagnosis not present

## 2019-04-19 DIAGNOSIS — E1142 Type 2 diabetes mellitus with diabetic polyneuropathy: Secondary | ICD-10-CM | POA: Diagnosis not present

## 2019-04-19 DIAGNOSIS — E1165 Type 2 diabetes mellitus with hyperglycemia: Secondary | ICD-10-CM | POA: Diagnosis not present

## 2019-04-19 DIAGNOSIS — M810 Age-related osteoporosis without current pathological fracture: Secondary | ICD-10-CM | POA: Diagnosis not present

## 2019-04-19 DIAGNOSIS — I10 Essential (primary) hypertension: Secondary | ICD-10-CM | POA: Diagnosis not present

## 2019-04-19 DIAGNOSIS — D509 Iron deficiency anemia, unspecified: Secondary | ICD-10-CM | POA: Diagnosis not present

## 2019-04-25 DIAGNOSIS — M4802 Spinal stenosis, cervical region: Secondary | ICD-10-CM | POA: Diagnosis not present

## 2019-04-27 ENCOUNTER — Encounter: Payer: Self-pay | Admitting: Gynecology

## 2019-04-27 DIAGNOSIS — H10501 Unspecified blepharoconjunctivitis, right eye: Secondary | ICD-10-CM | POA: Diagnosis not present

## 2019-05-02 DIAGNOSIS — E039 Hypothyroidism, unspecified: Secondary | ICD-10-CM | POA: Diagnosis not present

## 2019-05-02 DIAGNOSIS — Z794 Long term (current) use of insulin: Secondary | ICD-10-CM | POA: Diagnosis not present

## 2019-05-02 DIAGNOSIS — Z5181 Encounter for therapeutic drug level monitoring: Secondary | ICD-10-CM | POA: Diagnosis not present

## 2019-05-02 DIAGNOSIS — E1142 Type 2 diabetes mellitus with diabetic polyneuropathy: Secondary | ICD-10-CM | POA: Diagnosis not present

## 2019-05-09 DIAGNOSIS — E1142 Type 2 diabetes mellitus with diabetic polyneuropathy: Secondary | ICD-10-CM | POA: Diagnosis not present

## 2019-05-16 DIAGNOSIS — E039 Hypothyroidism, unspecified: Secondary | ICD-10-CM | POA: Diagnosis not present

## 2019-05-16 DIAGNOSIS — E782 Mixed hyperlipidemia: Secondary | ICD-10-CM | POA: Diagnosis not present

## 2019-05-16 DIAGNOSIS — E1165 Type 2 diabetes mellitus with hyperglycemia: Secondary | ICD-10-CM | POA: Diagnosis not present

## 2019-05-16 DIAGNOSIS — N189 Chronic kidney disease, unspecified: Secondary | ICD-10-CM | POA: Diagnosis not present

## 2019-05-16 DIAGNOSIS — D509 Iron deficiency anemia, unspecified: Secondary | ICD-10-CM | POA: Diagnosis not present

## 2019-05-16 DIAGNOSIS — M810 Age-related osteoporosis without current pathological fracture: Secondary | ICD-10-CM | POA: Diagnosis not present

## 2019-05-16 DIAGNOSIS — E1140 Type 2 diabetes mellitus with diabetic neuropathy, unspecified: Secondary | ICD-10-CM | POA: Diagnosis not present

## 2019-05-16 DIAGNOSIS — E119 Type 2 diabetes mellitus without complications: Secondary | ICD-10-CM | POA: Diagnosis not present

## 2019-05-16 DIAGNOSIS — I10 Essential (primary) hypertension: Secondary | ICD-10-CM | POA: Diagnosis not present

## 2019-05-16 DIAGNOSIS — E1142 Type 2 diabetes mellitus with diabetic polyneuropathy: Secondary | ICD-10-CM | POA: Diagnosis not present

## 2019-05-24 DIAGNOSIS — N182 Chronic kidney disease, stage 2 (mild): Secondary | ICD-10-CM | POA: Diagnosis not present

## 2019-06-02 DIAGNOSIS — Z794 Long term (current) use of insulin: Secondary | ICD-10-CM | POA: Diagnosis not present

## 2019-06-02 DIAGNOSIS — N189 Chronic kidney disease, unspecified: Secondary | ICD-10-CM | POA: Diagnosis not present

## 2019-06-02 DIAGNOSIS — D509 Iron deficiency anemia, unspecified: Secondary | ICD-10-CM | POA: Diagnosis not present

## 2019-06-02 DIAGNOSIS — E1142 Type 2 diabetes mellitus with diabetic polyneuropathy: Secondary | ICD-10-CM | POA: Diagnosis not present

## 2019-06-02 DIAGNOSIS — E1165 Type 2 diabetes mellitus with hyperglycemia: Secondary | ICD-10-CM | POA: Diagnosis not present

## 2019-06-02 DIAGNOSIS — E782 Mixed hyperlipidemia: Secondary | ICD-10-CM | POA: Diagnosis not present

## 2019-06-02 DIAGNOSIS — E1140 Type 2 diabetes mellitus with diabetic neuropathy, unspecified: Secondary | ICD-10-CM | POA: Diagnosis not present

## 2019-06-02 DIAGNOSIS — E119 Type 2 diabetes mellitus without complications: Secondary | ICD-10-CM | POA: Diagnosis not present

## 2019-06-02 DIAGNOSIS — I10 Essential (primary) hypertension: Secondary | ICD-10-CM | POA: Diagnosis not present

## 2019-06-02 DIAGNOSIS — M810 Age-related osteoporosis without current pathological fracture: Secondary | ICD-10-CM | POA: Diagnosis not present

## 2019-06-02 DIAGNOSIS — E039 Hypothyroidism, unspecified: Secondary | ICD-10-CM | POA: Diagnosis not present

## 2019-07-02 DIAGNOSIS — E119 Type 2 diabetes mellitus without complications: Secondary | ICD-10-CM | POA: Diagnosis not present

## 2019-07-02 DIAGNOSIS — E1165 Type 2 diabetes mellitus with hyperglycemia: Secondary | ICD-10-CM | POA: Diagnosis not present

## 2019-07-02 DIAGNOSIS — E1140 Type 2 diabetes mellitus with diabetic neuropathy, unspecified: Secondary | ICD-10-CM | POA: Diagnosis not present

## 2019-07-02 DIAGNOSIS — Z794 Long term (current) use of insulin: Secondary | ICD-10-CM | POA: Diagnosis not present

## 2019-07-19 DIAGNOSIS — E1142 Type 2 diabetes mellitus with diabetic polyneuropathy: Secondary | ICD-10-CM | POA: Diagnosis not present

## 2019-07-19 DIAGNOSIS — N189 Chronic kidney disease, unspecified: Secondary | ICD-10-CM | POA: Diagnosis not present

## 2019-07-19 DIAGNOSIS — M810 Age-related osteoporosis without current pathological fracture: Secondary | ICD-10-CM | POA: Diagnosis not present

## 2019-07-19 DIAGNOSIS — E1165 Type 2 diabetes mellitus with hyperglycemia: Secondary | ICD-10-CM | POA: Diagnosis not present

## 2019-07-19 DIAGNOSIS — D509 Iron deficiency anemia, unspecified: Secondary | ICD-10-CM | POA: Diagnosis not present

## 2019-07-19 DIAGNOSIS — E782 Mixed hyperlipidemia: Secondary | ICD-10-CM | POA: Diagnosis not present

## 2019-07-19 DIAGNOSIS — N182 Chronic kidney disease, stage 2 (mild): Secondary | ICD-10-CM | POA: Diagnosis not present

## 2019-07-19 DIAGNOSIS — E0842 Diabetes mellitus due to underlying condition with diabetic polyneuropathy: Secondary | ICD-10-CM | POA: Diagnosis not present

## 2019-07-19 DIAGNOSIS — I10 Essential (primary) hypertension: Secondary | ICD-10-CM | POA: Diagnosis not present

## 2019-07-19 DIAGNOSIS — E039 Hypothyroidism, unspecified: Secondary | ICD-10-CM | POA: Diagnosis not present

## 2019-07-20 ENCOUNTER — Other Ambulatory Visit: Payer: Self-pay

## 2019-07-20 DIAGNOSIS — Z20828 Contact with and (suspected) exposure to other viral communicable diseases: Secondary | ICD-10-CM | POA: Diagnosis not present

## 2019-07-20 DIAGNOSIS — Z20822 Contact with and (suspected) exposure to covid-19: Secondary | ICD-10-CM

## 2019-07-22 LAB — NOVEL CORONAVIRUS, NAA: SARS-CoV-2, NAA: NOT DETECTED

## 2019-07-27 DIAGNOSIS — E119 Type 2 diabetes mellitus without complications: Secondary | ICD-10-CM | POA: Diagnosis not present

## 2019-07-27 DIAGNOSIS — Z794 Long term (current) use of insulin: Secondary | ICD-10-CM | POA: Diagnosis not present

## 2019-07-27 DIAGNOSIS — Z961 Presence of intraocular lens: Secondary | ICD-10-CM | POA: Diagnosis not present

## 2019-07-31 DIAGNOSIS — L298 Other pruritus: Secondary | ICD-10-CM | POA: Diagnosis not present

## 2019-08-01 ENCOUNTER — Other Ambulatory Visit: Payer: Self-pay

## 2019-08-01 ENCOUNTER — Telehealth: Payer: Self-pay | Admitting: *Deleted

## 2019-08-01 NOTE — Telephone Encounter (Signed)
Patient called requesting refill on estrace cream, due to vaginal itching. I asked more questions and patient has been having vaginal itching, white discharge and vaginal odor x1 weeks now. I explained symptoms sounds like vaginal infection and best to schedule office visit with provider. Transferred to appointment desk to schedule.

## 2019-08-02 ENCOUNTER — Encounter: Payer: Self-pay | Admitting: Obstetrics & Gynecology

## 2019-08-02 ENCOUNTER — Ambulatory Visit: Payer: Medicare HMO | Admitting: Obstetrics & Gynecology

## 2019-08-02 VITALS — BP 140/80

## 2019-08-02 DIAGNOSIS — E119 Type 2 diabetes mellitus without complications: Secondary | ICD-10-CM | POA: Diagnosis not present

## 2019-08-02 DIAGNOSIS — Z794 Long term (current) use of insulin: Secondary | ICD-10-CM | POA: Diagnosis not present

## 2019-08-02 DIAGNOSIS — R300 Dysuria: Secondary | ICD-10-CM

## 2019-08-02 DIAGNOSIS — E1140 Type 2 diabetes mellitus with diabetic neuropathy, unspecified: Secondary | ICD-10-CM | POA: Diagnosis not present

## 2019-08-02 DIAGNOSIS — N898 Other specified noninflammatory disorders of vagina: Secondary | ICD-10-CM | POA: Diagnosis not present

## 2019-08-02 DIAGNOSIS — E1165 Type 2 diabetes mellitus with hyperglycemia: Secondary | ICD-10-CM | POA: Diagnosis not present

## 2019-08-02 LAB — WET PREP FOR TRICH, YEAST, CLUE

## 2019-08-02 MED ORDER — FLUCONAZOLE 150 MG PO TABS: 150.0000 mg | ORAL_TABLET | Freq: Every day | ORAL | 0 refills | Status: AC

## 2019-08-02 MED ORDER — NITROFURANTOIN MONOHYD MACRO 100 MG PO CAPS: 100.0000 mg | ORAL_CAPSULE | Freq: Two times a day (BID) | ORAL | 0 refills | Status: AC

## 2019-08-02 NOTE — Patient Instructions (Signed)
1. Dysuria Probable acute cystitis per urine analysis and symptoms.  Patient is allergic to sulfa.  Decision to treat with Macrobid.  Usage reviewed.  Prescription sent to pharmacy. - Urinalysis,Complete w/RFL Culture  2. Vaginal odor Wet prep confirming a yeast vaginitis.  Will treat with 1 tablet of fluconazole 150 mg/tab now.  We will also take 1 tablet daily for 2 days after the antibiotics.  Usage reviewed.  Prescription sent to pharmacy. - WET PREP FOR [place], YEAST, CLUE  Other orders - nitrofurantoin, macrocrystal-monohydrate, (MACROBID) 100 MG capsule; Take 1 capsule (100 mg total) by mouth 2 (two) times daily for 7 days. - fluconazole (DIFLUCAN) 150 MG tablet; Take 1 tablet (150 mg total) by mouth daily for 3 days. Take 1 tab PO today, then 1 tab PO daily x 2 after the MacroBID treatment is finished.  Shakura, it was a pleasure meeting you today!  I will inform you of your urine culture as soon as it is available.

## 2019-08-02 NOTE — Progress Notes (Signed)
    [name] [name] 03-12-1934 [id number]        86 y.o.  [id number]   RP: Pelvic pressure with dysuria and vaginal itching  HPI: Postmenopause with no PMB.  C/O pelvic pressure with burning when passing urine and urinary frequency for about 2 months.  No blood in urine and no fever.  C/O vaginal itching for many weeks.  Abstinent.   OB History  Gravida Para Term Preterm AB Living  5 5 0 0 0 4  SAB TAB Ectopic Multiple Live Births  0 0 0        # Outcome Date GA Lbr Len/2nd Weight Sex Delivery Anes PTL Lv  5 Para           4 Para           3 Para           2 Para           1 Para             Obstetric Comments  1 son passed away     Past medical history,surgical history, problem list, medications, allergies, family history and social history were all reviewed and documented in the EPIC chart.   Directed ROS with pertinent positives and negatives documented in the history of present illness/assessment and plan.  Exam:  Vitals:   08/02/19 1130  BP: 140/80   General appearance:  Normal  CVAT Negative bilaterally  Abdomen: Normal  Gynecologic exam: Vulva normal.  Speculum:  Cervix/Vagina normal.  Increased vaginal secretions.  Wet prep done.  Bimanual exam:  Uterus AV, mobile, normal volume, NT.  No adnexal mass, NT bilaterally.  U/A: Yellow clear, protein negative, nitrites negative, white blood cells 6-10, red blood cells 0-2, few bacteria.  Urine culture pending.  Wet prep Yeasts present   Assessment/Plan:  86 y.o. [id number]   1. Dysuria Probable acute cystitis per urine analysis and symptoms.  Patient is allergic to sulfa.  Decision to treat with Macrobid.  Usage reviewed.  Prescription sent to pharmacy. - Urinalysis,Complete w/RFL Culture  2. Vaginal odor Wet prep confirming a yeast vaginitis.  Will treat with 1 tablet of fluconazole 150 mg/tab now.  We will also take 1 tablet daily for 2 days after the antibiotics.  Usage reviewed.  Prescription sent to  pharmacy. - WET PREP FOR [place], YEAST, CLUE  Other orders - nitrofurantoin, macrocrystal-monohydrate, (MACROBID) 100 MG capsule; Take 1 capsule (100 mg total) by mouth 2 (two) times daily for 7 days. - fluconazole (DIFLUCAN) 150 MG tablet; Take 1 tablet (150 mg total) by mouth daily for 3 days. Take 1 tab PO today, then 1 tab PO daily x 2 after the MacroBID treatment is finished.  Counseling on above issues and coordination of care more than 50% for 25 minutes.  Princess Bruins MD, 11:43 AM 08/02/2019

## 2019-08-04 DIAGNOSIS — M65872 Other synovitis and tenosynovitis, left ankle and foot: Secondary | ICD-10-CM | POA: Diagnosis not present

## 2019-08-04 DIAGNOSIS — L303 Infective dermatitis: Secondary | ICD-10-CM | POA: Diagnosis not present

## 2019-08-04 LAB — URINALYSIS, COMPLETE W/RFL CULTURE
Bilirubin Urine: NEGATIVE
Hyaline Cast: NONE SEEN /LPF
Leukocyte Esterase: NEGATIVE
Nitrites, Initial: NEGATIVE
Protein, ur: NEGATIVE
Specific Gravity, Urine: 1.01 (ref 1.001–1.03)
pH: 6 (ref 5.0–8.0)

## 2019-08-04 LAB — URINE CULTURE
MICRO NUMBER:: 10037679
SPECIMEN QUALITY:: ADEQUATE

## 2019-08-04 LAB — CULTURE INDICATED

## 2019-08-07 DIAGNOSIS — M170 Bilateral primary osteoarthritis of knee: Secondary | ICD-10-CM | POA: Diagnosis not present

## 2019-08-10 ENCOUNTER — Telehealth: Payer: Self-pay | Admitting: General Practice

## 2019-08-10 NOTE — Telephone Encounter (Signed)
Gave patient negative covid test results Patient understood 

## 2019-08-16 DIAGNOSIS — D509 Iron deficiency anemia, unspecified: Secondary | ICD-10-CM | POA: Diagnosis not present

## 2019-08-16 DIAGNOSIS — I10 Essential (primary) hypertension: Secondary | ICD-10-CM | POA: Diagnosis not present

## 2019-08-16 DIAGNOSIS — E1165 Type 2 diabetes mellitus with hyperglycemia: Secondary | ICD-10-CM | POA: Diagnosis not present

## 2019-08-16 DIAGNOSIS — E119 Type 2 diabetes mellitus without complications: Secondary | ICD-10-CM | POA: Diagnosis not present

## 2019-08-16 DIAGNOSIS — M810 Age-related osteoporosis without current pathological fracture: Secondary | ICD-10-CM | POA: Diagnosis not present

## 2019-08-16 DIAGNOSIS — E782 Mixed hyperlipidemia: Secondary | ICD-10-CM | POA: Diagnosis not present

## 2019-08-16 DIAGNOSIS — N182 Chronic kidney disease, stage 2 (mild): Secondary | ICD-10-CM | POA: Diagnosis not present

## 2019-08-16 DIAGNOSIS — E039 Hypothyroidism, unspecified: Secondary | ICD-10-CM | POA: Diagnosis not present

## 2019-08-16 DIAGNOSIS — E1142 Type 2 diabetes mellitus with diabetic polyneuropathy: Secondary | ICD-10-CM | POA: Diagnosis not present

## 2019-08-16 DIAGNOSIS — N189 Chronic kidney disease, unspecified: Secondary | ICD-10-CM | POA: Diagnosis not present

## 2019-08-17 ENCOUNTER — Encounter (HOSPITAL_COMMUNITY): Payer: Self-pay

## 2019-08-17 ENCOUNTER — Emergency Department (HOSPITAL_COMMUNITY): Payer: Medicare HMO

## 2019-08-17 ENCOUNTER — Inpatient Hospital Stay (HOSPITAL_COMMUNITY)
Admission: EM | Admit: 2019-08-17 | Discharge: 2019-08-20 | DRG: 391 | Disposition: A | Discharge status: Home Health | Payer: Medicare HMO | Attending: Internal Medicine | Admitting: Internal Medicine

## 2019-08-17 ENCOUNTER — Other Ambulatory Visit: Payer: Self-pay

## 2019-08-17 DIAGNOSIS — E860 Dehydration: Secondary | ICD-10-CM | POA: Diagnosis present

## 2019-08-17 DIAGNOSIS — E785 Hyperlipidemia, unspecified: Secondary | ICD-10-CM | POA: Diagnosis present

## 2019-08-17 DIAGNOSIS — I10 Essential (primary) hypertension: Secondary | ICD-10-CM | POA: Diagnosis present

## 2019-08-17 DIAGNOSIS — Z20822 Contact with and (suspected) exposure to covid-19: Secondary | ICD-10-CM | POA: Diagnosis present

## 2019-08-17 DIAGNOSIS — Z794 Long term (current) use of insulin: Secondary | ICD-10-CM

## 2019-08-17 DIAGNOSIS — R404 Transient alteration of awareness: Secondary | ICD-10-CM | POA: Diagnosis not present

## 2019-08-17 DIAGNOSIS — R0902 Hypoxemia: Secondary | ICD-10-CM | POA: Diagnosis not present

## 2019-08-17 DIAGNOSIS — E7800 Pure hypercholesterolemia, unspecified: Secondary | ICD-10-CM | POA: Diagnosis present

## 2019-08-17 DIAGNOSIS — G9341 Metabolic encephalopathy: Secondary | ICD-10-CM | POA: Diagnosis present

## 2019-08-17 DIAGNOSIS — K529 Noninfective gastroenteritis and colitis, unspecified: Secondary | ICD-10-CM | POA: Diagnosis not present

## 2019-08-17 DIAGNOSIS — R4182 Altered mental status, unspecified: Secondary | ICD-10-CM | POA: Diagnosis not present

## 2019-08-17 DIAGNOSIS — Z825 Family history of asthma and other chronic lower respiratory diseases: Secondary | ICD-10-CM

## 2019-08-17 DIAGNOSIS — Z8249 Family history of ischemic heart disease and other diseases of the circulatory system: Secondary | ICD-10-CM

## 2019-08-17 DIAGNOSIS — E1165 Type 2 diabetes mellitus with hyperglycemia: Secondary | ICD-10-CM | POA: Diagnosis present

## 2019-08-17 DIAGNOSIS — N182 Chronic kidney disease, stage 2 (mild): Secondary | ICD-10-CM | POA: Diagnosis present

## 2019-08-17 DIAGNOSIS — Z7983 Long term (current) use of bisphosphonates: Secondary | ICD-10-CM

## 2019-08-17 DIAGNOSIS — G9340 Encephalopathy, unspecified: Secondary | ICD-10-CM | POA: Diagnosis not present

## 2019-08-17 DIAGNOSIS — E039 Hypothyroidism, unspecified: Secondary | ICD-10-CM | POA: Diagnosis present

## 2019-08-17 DIAGNOSIS — M545 Low back pain: Secondary | ICD-10-CM | POA: Diagnosis present

## 2019-08-17 DIAGNOSIS — M810 Age-related osteoporosis without current pathological fracture: Secondary | ICD-10-CM | POA: Diagnosis present

## 2019-08-17 DIAGNOSIS — T50995A Adverse effect of other drugs, medicaments and biological substances, initial encounter: Secondary | ICD-10-CM | POA: Diagnosis present

## 2019-08-17 DIAGNOSIS — R402411 Glasgow coma scale score 13-15, in the field [EMT or ambulance]: Secondary | ICD-10-CM | POA: Diagnosis not present

## 2019-08-17 DIAGNOSIS — E1151 Type 2 diabetes mellitus with diabetic peripheral angiopathy without gangrene: Secondary | ICD-10-CM | POA: Diagnosis present

## 2019-08-17 DIAGNOSIS — N3281 Overactive bladder: Secondary | ICD-10-CM | POA: Diagnosis present

## 2019-08-17 DIAGNOSIS — M170 Bilateral primary osteoarthritis of knee: Secondary | ICD-10-CM | POA: Diagnosis present

## 2019-08-17 DIAGNOSIS — L309 Dermatitis, unspecified: Secondary | ICD-10-CM | POA: Diagnosis present

## 2019-08-17 DIAGNOSIS — N179 Acute kidney failure, unspecified: Secondary | ICD-10-CM | POA: Diagnosis present

## 2019-08-17 DIAGNOSIS — Z7982 Long term (current) use of aspirin: Secondary | ICD-10-CM

## 2019-08-17 DIAGNOSIS — E1159 Type 2 diabetes mellitus with other circulatory complications: Secondary | ICD-10-CM | POA: Diagnosis present

## 2019-08-17 DIAGNOSIS — N183 Chronic kidney disease, stage 3 unspecified: Secondary | ICD-10-CM | POA: Diagnosis present

## 2019-08-17 DIAGNOSIS — Z79890 Hormone replacement therapy (postmenopausal): Secondary | ICD-10-CM

## 2019-08-17 DIAGNOSIS — Z833 Family history of diabetes mellitus: Secondary | ICD-10-CM

## 2019-08-17 DIAGNOSIS — R69 Illness, unspecified: Secondary | ICD-10-CM | POA: Diagnosis not present

## 2019-08-17 DIAGNOSIS — Z888 Allergy status to other drugs, medicaments and biological substances status: Secondary | ICD-10-CM

## 2019-08-17 DIAGNOSIS — I129 Hypertensive chronic kidney disease with stage 1 through stage 4 chronic kidney disease, or unspecified chronic kidney disease: Secondary | ICD-10-CM | POA: Diagnosis present

## 2019-08-17 DIAGNOSIS — Z882 Allergy status to sulfonamides status: Secondary | ICD-10-CM

## 2019-08-17 DIAGNOSIS — K219 Gastro-esophageal reflux disease without esophagitis: Secondary | ICD-10-CM | POA: Diagnosis present

## 2019-08-17 DIAGNOSIS — M4800 Spinal stenosis, site unspecified: Secondary | ICD-10-CM | POA: Diagnosis present

## 2019-08-17 DIAGNOSIS — R739 Hyperglycemia, unspecified: Secondary | ICD-10-CM

## 2019-08-17 DIAGNOSIS — R509 Fever, unspecified: Secondary | ICD-10-CM | POA: Diagnosis not present

## 2019-08-17 DIAGNOSIS — E1122 Type 2 diabetes mellitus with diabetic chronic kidney disease: Secondary | ICD-10-CM | POA: Diagnosis present

## 2019-08-17 DIAGNOSIS — G8929 Other chronic pain: Secondary | ICD-10-CM | POA: Diagnosis present

## 2019-08-17 DIAGNOSIS — N189 Chronic kidney disease, unspecified: Secondary | ICD-10-CM | POA: Diagnosis present

## 2019-08-17 DIAGNOSIS — Z881 Allergy status to other antibiotic agents status: Secondary | ICD-10-CM

## 2019-08-17 DIAGNOSIS — E119 Type 2 diabetes mellitus without complications: Secondary | ICD-10-CM

## 2019-08-17 LAB — CBC WITH DIFFERENTIAL/PLATELET
Abs Immature Granulocytes: 0.05 10*3/uL (ref 0.00–0.07)
Basophils Absolute: 0 10*3/uL (ref 0.0–0.1)
Basophils Relative: 0 %
Eosinophils Absolute: 0 10*3/uL (ref 0.0–0.5)
Eosinophils Relative: 0 %
HCT: 36.5 % (ref 36.0–46.0)
Hemoglobin: 12.1 g/dL (ref 12.0–15.0)
Immature Granulocytes: 1 %
Lymphocytes Relative: 7 %
Lymphs Abs: 0.6 10*3/uL — ABNORMAL LOW (ref 0.7–4.0)
MCH: 32 pg (ref 26.0–34.0)
MCHC: 33.2 g/dL (ref 30.0–36.0)
MCV: 96.6 fL (ref 80.0–100.0)
Monocytes Absolute: 0.3 10*3/uL (ref 0.1–1.0)
Monocytes Relative: 3 %
Neutro Abs: 8.2 10*3/uL — ABNORMAL HIGH (ref 1.7–7.7)
Neutrophils Relative %: 89 %
Platelets: 242 10*3/uL (ref 150–400)
RBC: 3.78 MIL/uL — ABNORMAL LOW (ref 3.87–5.11)
RDW: 12.3 % (ref 11.5–15.5)
WBC: 9.1 10*3/uL (ref 4.0–10.5)
nRBC: 0 % (ref 0.0–0.2)

## 2019-08-17 LAB — COMPREHENSIVE METABOLIC PANEL
ALT: 32 U/L (ref 0–44)
AST: 35 U/L (ref 15–41)
Albumin: 4.1 g/dL (ref 3.5–5.0)
Alkaline Phosphatase: 85 U/L (ref 38–126)
Anion gap: 17 — ABNORMAL HIGH (ref 5–15)
BUN: 22 mg/dL (ref 8–23)
CO2: 22 mmol/L (ref 22–32)
Calcium: 10.3 mg/dL (ref 8.9–10.3)
Chloride: 92 mmol/L — ABNORMAL LOW (ref 98–111)
Creatinine, Ser: 1.29 mg/dL — ABNORMAL HIGH (ref 0.44–1.00)
GFR calc Af Amer: 44 mL/min — ABNORMAL LOW (ref >60)
GFR calc non Af Amer: 38 mL/min — ABNORMAL LOW (ref >60)
Glucose, Bld: 454 mg/dL — ABNORMAL HIGH (ref 70–99)
Potassium: 4.4 mmol/L (ref 3.5–5.1)
Sodium: 131 mmol/L — ABNORMAL LOW (ref 135–145)
Total Bilirubin: 0.7 mg/dL (ref 0.3–1.2)
Total Protein: 8 g/dL (ref 6.5–8.1)

## 2019-08-17 LAB — URINALYSIS, ROUTINE W REFLEX MICROSCOPIC
Bacteria, UA: NONE SEEN
Bilirubin Urine: NEGATIVE
Glucose, UA: >=500 mg/dL — AB
Hgb urine dipstick: NEGATIVE
Ketones, ur: 20 mg/dL — AB
Leukocytes,Ua: NEGATIVE
Nitrite: NEGATIVE
Protein, ur: 100 mg/dL — AB
Specific Gravity, Urine: 1.015 (ref 1.005–1.030)
pH: 8 (ref 5.0–8.0)

## 2019-08-17 LAB — PROTIME-INR
INR: 0.9 (ref 0.8–1.2)
Prothrombin Time: 12.4 seconds (ref 11.4–15.2)

## 2019-08-17 LAB — CBG MONITORING, ED: Glucose-Capillary: 451 mg/dL — ABNORMAL HIGH (ref 70–99)

## 2019-08-17 MED ORDER — ACETAMINOPHEN 650 MG RE SUPP
650.0000 mg | Freq: Once | RECTAL | Status: AC
  Administered 2019-08-17: 650 mg via RECTAL
  Filled 2019-08-17: qty 1

## 2019-08-17 NOTE — ED Notes (Signed)
[name] [name] daughter [id number] looking for an update on the patient

## 2019-08-17 NOTE — ED Triage Notes (Signed)
Pt bib [facility]ce[facility] from home w/ c/o AMS. LKW 1400 today. At baseline pt is AOx4, however, currently AOx2. Per EMS pts family left pt at 54 today and she was neuro intact, when they came back to the house around 1700 pt was confused and had "destroyed the house". For example, broken dishes everywhere. Pt c/o R sided head pain but unable to provide a pain scale number. EMS VS  RR 16 HR 75 NSR SPO2 95% on RA CBG 477 BP 200/90

## 2019-08-17 NOTE — ED Provider Notes (Addendum)
TIME SEEN: 11:14 PM  CHIEF COMPLAINT: AMS  HPI: Patient is a 86 y.o. F with h/o HTN, DM, HLD, hypothyroidism who presents to ED with AMS.  Spoke with patient's daughter [name] [name] by phone [phone]).  Mother was acting normal this morning.  Was able to drive her daughter to an errand.  11am and 2pm was talking to daughter on phone and seemed normal.  Daughter called at 6pm and no answer so daughter went to house.  Gate was open and pocketbook was hanging on the gate.  Back door was open.  Broken plates, refrigerator doors open, food everywhere.  Vomit was in both toilets.  Was sitting on couch and appeared "pooped out".  Called 911.    Daughter reports patient has been having tingling in feet and leg with pain for "a while."  Went and picked up medicine from [facility] (not a prescription).  Daughter reports it was [employer] "Healthy Feet and Nerves".  Contains multiple B vitamins, zinc, chromium, alpha lipoic acid, boswellia, benfotiamine.   Recently finished Macrobid for UTI.  No other new medications other than medication listed above.  Similar episode before due to dehydration.  No recent falls or infectious symptoms that daughter is aware of.  Patient lives at home alone.    ROS: Level V caveat for AMS  PAST MEDICAL HISTORY/PAST SURGICAL HISTORY:  Past Medical History:  Diagnosis Date  . Arthritis    "knees, legs" (06/10/2016)  . Ascites   . Chronic kidney disease    "related to my diabetes"  . Chronic lower back pain   . Diabetes mellitus without complication ([place])   . Eczema   . GERD (gastroesophageal reflux disease)   . High cholesterol   . Hyperlipidemia   . Hypertension   . Hypothyroidism   . OAB (overactive bladder)   . Osteoporosis   . Thyroid disease   . Type II diabetes mellitus ([place])   . Urticaria     MEDICATIONS:  Prior to Admission medications   Medication Sig Start Date End Date Taking? Authorizing Provider  acetaminophen (TYLENOL) 500 MG tablet  Take 500 mg by mouth every 6 (six) hours as needed.    [provider]  alendronate (FOSAMAX) 70 MG tablet TAKE 1 TABLET BY MOUTH EVERY 7 DAYS ON AN EMPTY STOMACH WITH A FULL GLASS OF WATER 06/27/18   Princess Bruins, MD  aspirin EC 81 MG tablet Take 81 mg by mouth daily.    [provider]  atorvastatin (LIPITOR) 40 MG tablet TK 1 T PO QD FOR CHOLESTEROL 02/16/19   [provider]  B-D UF III MINI PEN NEEDLES 31G X 5 MM MISC USE AS DIRECTED TID 02/14/19   [provider]  calcium-vitamin D 250-100 MG-UNIT tablet Take 2 tablets by mouth daily.    [provider]  cholecalciferol (VITAMIN D) 1000 units tablet Take 1,000 Units by mouth daily.    [provider]  estradiol (ESTRACE) 0.1 MG/GM vaginal cream INSERT 1/4 APPLICATORFUL VAGINALLY TWICE WEEKLY( MONDAY AND THURSDAY) 09/21/18   Princess Bruins, MD  FLUAD 0.5 ML SUSY ADM 0.5ML IM UTD 03/09/18   [provider]  insulin aspart (NOVOLOG) 100 UNIT/ML injection Inject 6-8 Units into the skin 3 (three) times daily before meals. SLIDING SCALE: 70-199= 6 UNITS 200-299= 7 UNITS >300= 8 UNITS    [provider]  Insulin Detemir (LEVEMIR FLEXTOUCH) 100 UNIT/ML Pen Inject 7 Units into the skin 2 (two) times daily.  [provider]  Iron-FA-B Cmp-C-Biot-Probiotic (FUSION PLUS) CAPS TK 1 C PO QD 03/02/19   [provider]  levothyroxine (SYNTHROID, LEVOTHROID) 100 MCG tablet Take 100 mcg by mouth daily before breakfast.    [provider]  losartan (COZAAR) 50 MG tablet TK 1 T PO QD 01/24/19   [provider]  mometasone (NASONEX) 50 MCG/ACT nasal spray Place 2 sprays into the nose daily as needed (nasal congestion). 04/05/19   Kennith Gain, MD  Multiple Vitamin (MULTIVITAMIN WITH MINERALS) TABS tablet Take 1 tablet by mouth daily.    [provider]  olopatadine (PATANOL) 0.1 % ophthalmic solution Place 1 drop into both eyes 2  (two) times daily. 03/18/18   Kennith Gain, MD  olopatadine (PATANOL) 0.1 % ophthalmic solution Place 1 drop into both eyes 2 (two) times daily. 04/05/19   Kennith Gain, MD  Omega-3 Fatty Acids (FISH OIL) 1000 MG CAPS Take 1,000 mg by mouth 2 (two) times daily.    [provider]  pantoprazole (PROTONIX) 40 MG tablet TK 1 T PO QD 03/25/19   [provider]  polyethylene glycol (MIRALAX / GLYCOLAX) packet Take 17 g by mouth every Monday, Wednesday, and Friday.    [provider]    ALLERGIES:  Allergies  Allergen Reactions  . Sulfonamide Derivatives Hives  . Bactrim [Sulfamethoxazole-Trimethoprim] Rash    Reported by [facility]  . Lisinopril Cough    Reported by [facility]    SOCIAL HISTORY:  Social History   Tobacco Use  . Smoking status: Never Smoker  . Smokeless tobacco: Former Systems developer    Types: Chew  Substance Use Topics  . Alcohol use: No    FAMILY HISTORY: Family History  Problem Relation Age of Onset  . Diabetes Mother   . Hypertension Mother   . Diabetes Sister   . Diabetes Brother   . Asthma Father   . Allergic rhinitis Neg Hx   . Eczema Neg Hx   . Urticaria Neg Hx     EXAM: BP (!) 185/86   Pulse 70   Temp 100 F (37.8 C) (Rectal)   Resp 17   SpO2 93%  CONSTITUTIONAL: Alert and oriented person and place but not time.  Unable to answer most questions appropriately.  Appears agitated. HEAD: Normocephalic, atraumatic EYES: Conjunctivae clear, pupils appear equal, EOM appear intact ENT: normal nose; moist mucous membranes NECK: Supple, normal ROM CARD: RRR; S1 and S2 appreciated; no murmurs, no clicks, no rubs, no gallops RESP: Normal chest excursion without splinting or tachypnea; breath sounds clear and equal bilaterally; no wheezes, no rhonchi, no rales, no hypoxia or respiratory distress, speaking full sentences ABD/GI: Normal bowel sounds; non-distended; soft, non-tender, no rebound, no guarding,  no peritoneal signs, no hepatosplenomegaly BACK:  The back appears normal EXT: Normal ROM in all joints; no deformity noted, no edema; no cyanosis SKIN: Normal color for age and race; warm; no rash on exposed skin NEURO: Moves all extremities equally, normal speech, no facial asymmetry PSYCH: The patient's mood and manner are appropriate.   MEDICAL DECISION MAKING: Patient here with altered mental status.  Found to have rectal temperature of 100.  Will check labs, urine, chest x-ray, Covid swab, head CT.  Differential includes UTI, bacteremia, pneumonia, COVID-19, stroke, encephalopathy secondary to medications, dehydration.  ED PROGRESS: Patient's labs show hyperglycemia without DKA.  Receiving IV fluids and insulin.  Her creatinine is also mildly elevated suggestive of dehydration.  Chest x-ray clear.  Urine does not appear infected.  Covid swab negative.  Head CT shows no acute abnormality.  Patient appears less agitated and is resting comfortably but is still confused.  I feel she needs admission.  Updated patient's daughter.  She reports that patient has had a bowel obstruction about 2 years ago.  She is worried that this could be the cause of her symptoms.  Patient's abdominal exam seems benign but did have vomiting at home.  Given low-grade temperature as well, will obtain CT of the abdomen and pelvis.   2:29 AM Discussed patient's case with hospitalist, Dr. Marlowe Sax.  I have recommended admission and patient (and family if present) agree with this plan. Admitting physician will place admission orders.   I reviewed all nursing notes, vitals, pertinent previous records and interpreted all EKGs, lab and urine results, imaging (as available).      EKG Interpretation  Date/Time:  Thursday August 17 2019 22:25:10 EST Ventricular Rate:  81 PR Interval:    QRS Duration: 112 QT Interval:  390 QTC Calculation: 453 R Axis:   65 Text Interpretation: Sinus rhythm Prolonged PR interval  Incomplete right bundle branch block No significant change since last tracing Confirmed by Pryor Curia [phone]) on 08/17/2019 11:15:09 PM         [name] [name] was evaluated in Emergency Department on 08/17/2019 for the symptoms described in the history of present illness. She was evaluated in the context of the global COVID-19 pandemic, which necessitated consideration that the patient might be at risk for infection with the SARS-CoV-2 virus that causes COVID-19. Institutional protocols and algorithms that pertain to the evaluation of patients at risk for COVID-19 are in a state of rapid change based on information released by regulatory bodies including the CDC and federal and state organizations. These policies and algorithms were followed during the patient's care in the ED.  Patient was seen wearing N95, face shield, gloves.    [name] [name], Delice Bison, DO 08/18/19 0229    4:15 AM  Pt's CT shows no bowel obstruction but does show what appears to be diffuse enterocolitis.  Will update hospitalist.   Senta Kantor, Delice Bison, DO 08/18/19 0416   5:28 AM  Updated hospitalist.  Recommend starting Cipro and Flagyl.  First dose of these medications have been ordered.  Called daughter for update as well.   Shalom Ware, Delice Bison, DO 08/18/19 [phone]

## 2019-08-18 ENCOUNTER — Emergency Department (HOSPITAL_COMMUNITY): Payer: Medicare HMO

## 2019-08-18 ENCOUNTER — Encounter (HOSPITAL_COMMUNITY): Payer: Self-pay | Admitting: Internal Medicine

## 2019-08-18 ENCOUNTER — Observation Stay (HOSPITAL_COMMUNITY): Payer: Medicare HMO

## 2019-08-18 DIAGNOSIS — Z7982 Long term (current) use of aspirin: Secondary | ICD-10-CM | POA: Diagnosis not present

## 2019-08-18 DIAGNOSIS — Z833 Family history of diabetes mellitus: Secondary | ICD-10-CM | POA: Diagnosis not present

## 2019-08-18 DIAGNOSIS — N179 Acute kidney failure, unspecified: Secondary | ICD-10-CM | POA: Diagnosis not present

## 2019-08-18 DIAGNOSIS — K529 Noninfective gastroenteritis and colitis, unspecified: Secondary | ICD-10-CM

## 2019-08-18 DIAGNOSIS — N189 Chronic kidney disease, unspecified: Secondary | ICD-10-CM | POA: Diagnosis present

## 2019-08-18 DIAGNOSIS — N3281 Overactive bladder: Secondary | ICD-10-CM | POA: Diagnosis present

## 2019-08-18 DIAGNOSIS — Z20822 Contact with and (suspected) exposure to covid-19: Secondary | ICD-10-CM | POA: Diagnosis not present

## 2019-08-18 DIAGNOSIS — M545 Low back pain: Secondary | ICD-10-CM | POA: Diagnosis present

## 2019-08-18 DIAGNOSIS — L309 Dermatitis, unspecified: Secondary | ICD-10-CM | POA: Diagnosis present

## 2019-08-18 DIAGNOSIS — E860 Dehydration: Secondary | ICD-10-CM | POA: Diagnosis not present

## 2019-08-18 DIAGNOSIS — M170 Bilateral primary osteoarthritis of knee: Secondary | ICD-10-CM | POA: Diagnosis present

## 2019-08-18 DIAGNOSIS — N183 Chronic kidney disease, stage 3 unspecified: Secondary | ICD-10-CM | POA: Diagnosis not present

## 2019-08-18 DIAGNOSIS — E7800 Pure hypercholesterolemia, unspecified: Secondary | ICD-10-CM | POA: Diagnosis present

## 2019-08-18 DIAGNOSIS — I129 Hypertensive chronic kidney disease with stage 1 through stage 4 chronic kidney disease, or unspecified chronic kidney disease: Secondary | ICD-10-CM | POA: Diagnosis not present

## 2019-08-18 DIAGNOSIS — R739 Hyperglycemia, unspecified: Secondary | ICD-10-CM

## 2019-08-18 DIAGNOSIS — E785 Hyperlipidemia, unspecified: Secondary | ICD-10-CM | POA: Diagnosis not present

## 2019-08-18 DIAGNOSIS — T50995A Adverse effect of other drugs, medicaments and biological substances, initial encounter: Secondary | ICD-10-CM | POA: Diagnosis present

## 2019-08-18 DIAGNOSIS — G9341 Metabolic encephalopathy: Secondary | ICD-10-CM | POA: Diagnosis not present

## 2019-08-18 DIAGNOSIS — R4182 Altered mental status, unspecified: Secondary | ICD-10-CM | POA: Diagnosis not present

## 2019-08-18 DIAGNOSIS — M810 Age-related osteoporosis without current pathological fracture: Secondary | ICD-10-CM | POA: Diagnosis present

## 2019-08-18 DIAGNOSIS — E1151 Type 2 diabetes mellitus with diabetic peripheral angiopathy without gangrene: Secondary | ICD-10-CM | POA: Diagnosis present

## 2019-08-18 DIAGNOSIS — K760 Fatty (change of) liver, not elsewhere classified: Secondary | ICD-10-CM | POA: Diagnosis not present

## 2019-08-18 DIAGNOSIS — E039 Hypothyroidism, unspecified: Secondary | ICD-10-CM | POA: Diagnosis not present

## 2019-08-18 DIAGNOSIS — G9340 Encephalopathy, unspecified: Secondary | ICD-10-CM | POA: Diagnosis present

## 2019-08-18 DIAGNOSIS — E1165 Type 2 diabetes mellitus with hyperglycemia: Secondary | ICD-10-CM | POA: Diagnosis not present

## 2019-08-18 DIAGNOSIS — K219 Gastro-esophageal reflux disease without esophagitis: Secondary | ICD-10-CM | POA: Diagnosis present

## 2019-08-18 DIAGNOSIS — G8929 Other chronic pain: Secondary | ICD-10-CM | POA: Diagnosis present

## 2019-08-18 DIAGNOSIS — Z79890 Hormone replacement therapy (postmenopausal): Secondary | ICD-10-CM | POA: Diagnosis not present

## 2019-08-18 DIAGNOSIS — E1122 Type 2 diabetes mellitus with diabetic chronic kidney disease: Secondary | ICD-10-CM | POA: Diagnosis present

## 2019-08-18 LAB — RAPID URINE DRUG SCREEN, HOSP PERFORMED
Amphetamines: NOT DETECTED
Barbiturates: NOT DETECTED
Benzodiazepines: NOT DETECTED
Cocaine: NOT DETECTED
Opiates: NOT DETECTED
Tetrahydrocannabinol: NOT DETECTED

## 2019-08-18 LAB — T4, FREE: Free T4: 1.23 ng/dL — ABNORMAL HIGH (ref 0.61–1.12)

## 2019-08-18 LAB — BASIC METABOLIC PANEL
Anion gap: 14 (ref 5–15)
BUN: 21 mg/dL (ref 8–23)
CO2: 23 mmol/L (ref 22–32)
Calcium: 9.8 mg/dL (ref 8.9–10.3)
Chloride: 94 mmol/L — ABNORMAL LOW (ref 98–111)
Creatinine, Ser: 1.41 mg/dL — ABNORMAL HIGH (ref 0.44–1.00)
GFR calc Af Amer: 40 mL/min — ABNORMAL LOW (ref >60)
GFR calc non Af Amer: 34 mL/min — ABNORMAL LOW (ref >60)
Glucose, Bld: 457 mg/dL — ABNORMAL HIGH (ref 70–99)
Potassium: 4.4 mmol/L (ref 3.5–5.1)
Sodium: 131 mmol/L — ABNORMAL LOW (ref 135–145)

## 2019-08-18 LAB — RESPIRATORY PANEL BY RT PCR (FLU A&B, COVID)
Influenza A by PCR: NEGATIVE
Influenza B by PCR: NEGATIVE
SARS Coronavirus 2 by RT PCR: NEGATIVE

## 2019-08-18 LAB — URINE CULTURE: Culture: NO GROWTH

## 2019-08-18 LAB — GLUCOSE, CAPILLARY
Glucose-Capillary: 417 mg/dL — ABNORMAL HIGH (ref 70–99)
Glucose-Capillary: 264 mg/dL — ABNORMAL HIGH (ref 70–99)
Glucose-Capillary: 258 mg/dL — ABNORMAL HIGH (ref 70–99)
Glucose-Capillary: 101 mg/dL — ABNORMAL HIGH (ref 70–99)

## 2019-08-18 LAB — LACTIC ACID, PLASMA: Lactic Acid, Venous: 1.3 mmol/L (ref 0.5–1.9)

## 2019-08-18 LAB — TSH: TSH: 8.334 u[IU]/mL — ABNORMAL HIGH (ref 0.350–4.500)

## 2019-08-18 LAB — HEMOGLOBIN A1C
Hgb A1c MFr Bld: 8.6 % — ABNORMAL HIGH (ref 4.8–5.6)
Mean Plasma Glucose: 200.12 mg/dL

## 2019-08-18 LAB — AMMONIA: Ammonia: 27 umol/L (ref 9–35)

## 2019-08-18 LAB — VITAMIN B12: Vitamin B-12: 3547 pg/mL — ABNORMAL HIGH (ref 180–914)

## 2019-08-18 LAB — CBG MONITORING, ED
Glucose-Capillary: 475 mg/dL — ABNORMAL HIGH (ref 70–99)
Glucose-Capillary: 391 mg/dL — ABNORMAL HIGH (ref 70–99)

## 2019-08-18 MED ORDER — METRONIDAZOLE IN NACL 5-0.79 MG/ML-% IV SOLN
500.0000 mg | Freq: Once | INTRAVENOUS | Status: AC
  Administered 2019-08-18: 500 mg via INTRAVENOUS
  Filled 2019-08-18: qty 100

## 2019-08-18 MED ORDER — SODIUM CHLORIDE 0.9 % IV BOLUS (SEPSIS)
1000.0000 mL | Freq: Once | INTRAVENOUS | Status: AC
  Administered 2019-08-18: 1000 mL via INTRAVENOUS

## 2019-08-18 MED ORDER — ENOXAPARIN SODIUM 30 MG/0.3ML ~~LOC~~ SOLN
30.0000 mg | SUBCUTANEOUS | Status: DC
  Administered 2019-08-18 – 2019-08-20 (×3): 30 mg via SUBCUTANEOUS
  Filled 2019-08-18 (×3): qty 0.3

## 2019-08-18 MED ORDER — ACETAMINOPHEN 650 MG RE SUPP: 650.0000 mg | Freq: Four times a day (QID) | RECTAL | Status: DC | PRN

## 2019-08-18 MED ORDER — INSULIN ASPART 100 UNIT/ML ~~LOC~~ SOLN
10.0000 [IU] | Freq: Once | SUBCUTANEOUS | Status: AC
  Administered 2019-08-18: 10 [IU] via SUBCUTANEOUS

## 2019-08-18 MED ORDER — HYDRALAZINE HCL 20 MG/ML IJ SOLN: 5.0000 mg | INTRAMUSCULAR | Status: DC | PRN

## 2019-08-18 MED ORDER — INSULIN ASPART 100 UNIT/ML ~~LOC~~ SOLN
3.0000 [IU] | Freq: Three times a day (TID) | SUBCUTANEOUS | Status: DC
  Administered 2019-08-18 – 2019-08-20 (×5): 3 [IU] via SUBCUTANEOUS

## 2019-08-18 MED ORDER — INSULIN ASPART 100 UNIT/ML ~~LOC~~ SOLN
0.0000 [IU] | Freq: Every day | SUBCUTANEOUS | Status: DC
  Administered 2019-08-19: 4 [IU] via SUBCUTANEOUS

## 2019-08-18 MED ORDER — ACETAMINOPHEN 325 MG PO TABS
650.0000 mg | ORAL_TABLET | Freq: Four times a day (QID) | ORAL | Status: DC | PRN
  Administered 2019-08-18: 650 mg via ORAL
  Filled 2019-08-18: qty 2

## 2019-08-18 MED ORDER — INSULIN ASPART 100 UNIT/ML ~~LOC~~ SOLN
0.0000 [IU] | Freq: Three times a day (TID) | SUBCUTANEOUS | Status: DC
  Administered 2019-08-18 (×2): 8 [IU] via SUBCUTANEOUS
  Administered 2019-08-18 – 2019-08-19 (×2): 15 [IU] via SUBCUTANEOUS
  Administered 2019-08-19: 2 [IU] via SUBCUTANEOUS

## 2019-08-18 MED ORDER — SODIUM CHLORIDE 0.9 % IV SOLN
INTRAVENOUS | Status: DC | PRN
  Administered 2019-08-18: 1000 mL via INTRAVENOUS

## 2019-08-18 MED ORDER — PANTOPRAZOLE SODIUM 40 MG PO TBEC
40.0000 mg | DELAYED_RELEASE_TABLET | Freq: Every day | ORAL | Status: DC
  Administered 2019-08-18 – 2019-08-20 (×3): 40 mg via ORAL
  Filled 2019-08-18 (×3): qty 1

## 2019-08-18 MED ORDER — ONDANSETRON HCL 4 MG/2ML IJ SOLN: 4.0000 mg | Freq: Four times a day (QID) | INTRAMUSCULAR | Status: DC | PRN

## 2019-08-18 MED ORDER — SODIUM CHLORIDE 0.9 % IV SOLN: INTRAVENOUS | Status: AC

## 2019-08-18 MED ORDER — METRONIDAZOLE IN NACL 5-0.79 MG/ML-% IV SOLN
500.0000 mg | Freq: Three times a day (TID) | INTRAVENOUS | Status: DC
  Administered 2019-08-18 – 2019-08-20 (×6): 500 mg via INTRAVENOUS
  Filled 2019-08-18 (×6): qty 100

## 2019-08-18 MED ORDER — INSULIN DETEMIR 100 UNIT/ML ~~LOC~~ SOLN
10.0000 [IU] | Freq: Every day | SUBCUTANEOUS | Status: DC
  Administered 2019-08-18: 10 [IU] via SUBCUTANEOUS
  Filled 2019-08-18: qty 0.1

## 2019-08-18 MED ORDER — INSULIN DETEMIR 100 UNIT/ML ~~LOC~~ SOLN
8.0000 [IU] | Freq: Two times a day (BID) | SUBCUTANEOUS | Status: DC
  Administered 2019-08-18 – 2019-08-20 (×4): 8 [IU] via SUBCUTANEOUS
  Filled 2019-08-18 (×5): qty 0.08

## 2019-08-18 MED ORDER — IOHEXOL 300 MG/ML  SOLN
100.0000 mL | Freq: Once | INTRAMUSCULAR | Status: AC | PRN
  Administered 2019-08-18: 100 mL via INTRAVENOUS

## 2019-08-18 MED ORDER — LEVOTHYROXINE SODIUM 25 MCG PO TABS
137.0000 ug | ORAL_TABLET | Freq: Every day | ORAL | Status: DC
  Administered 2019-08-18 – 2019-08-20 (×3): 137 ug via ORAL
  Filled 2019-08-18 (×3): qty 1

## 2019-08-18 MED ORDER — CIPROFLOXACIN IN D5W 400 MG/200ML IV SOLN
400.0000 mg | Freq: Once | INTRAVENOUS | Status: AC
  Administered 2019-08-18: 400 mg via INTRAVENOUS
  Filled 2019-08-18: qty 200

## 2019-08-18 MED ORDER — CIPROFLOXACIN IN D5W 400 MG/200ML IV SOLN
400.0000 mg | Freq: Two times a day (BID) | INTRAVENOUS | Status: DC
  Administered 2019-08-18 – 2019-08-19 (×3): 400 mg via INTRAVENOUS
  Filled 2019-08-18 (×3): qty 200

## 2019-08-18 NOTE — Progress Notes (Signed)
New Admission Note:  Arrival Method: Stretcher Mental Orientation: Alert to self only Telemetry: Box 16 NSR Assessment: Completed Skin: warm and dry IV: NSL Pain: Denies Tubes: N/A Safety Measures: Safety Fall Prevention Plan initiated.  Admission: Completed 5 M  Orientation: Patient has been orientated to the room, unit and the staff. Welcome booklet given.  Family: None  Orders have been reviewed and implemented. Will continue to monitor the patient. Call light has been placed within reach and bed alarm has been activated.   Sima Matas BSN, RN  Phone Number: [phone]

## 2019-08-18 NOTE — H&P (Signed)
History and Physical    NELLA TRAGESSER [id number] DOB: [birth year] DOA: 08/17/2019  PCP: Lajean Manes, MD Patient coming from: Home  Chief Complaint: Altered mental status  HPI: [name] [name] is a 86 y.o. female with medical history significant of CKD stage II-III, type 2 diabetes, chronic back pain, arthritis, GERD, hypertension, hyperlipidemia, hypothyroidism, and conditions listed below presenting to the ED via EMS for evaluation of altered mental status.  Daughter reported to ED provider that up until 2 PM patient was in her normal state of health.  At 6 PM when her daughter called her, patient did not answer the phone so daughter went to check on her.  When she reached the house, the gate was open and patient's pocketbook was hanging on the gate.  There were broken plates, food everywhere, and refrigerator door open.  There was vomit in both the toilets.  Daughter reported that patient recently started taking an over-the-counter herbal supplement for neuropathic pain.  She recently finished Macrobid for UTI.  Daughter reported that patient has had a similar episode before due to dehydration.  No recent falls or infectious symptoms reported by daughter.  Patient lives alone at home. Patient appears confused and is not sure why she is here.  Reports vomiting yesterday.  Complaining of periumbilical abdominal pain.  No diarrhea or fevers.  No headaches or neck pain.  No cough or shortness of breath.  No dysuria.  No additional history could be obtained from the patient.  ED Course: Systolic in the [id number] on arrival, remainder of vital signs stable.  Temperature 100 F.  Labs showing no leukocytosis.  Blood glucose elevated at 454.  Labs not suggestive of DKA.  BUN 22, creatinine 1.2.  Baseline creatinine 0.6-0.9.  UA not suggestive of UTI.  Urine culture pending.  Blood culture x2 pending.  SARS-CoV-2 PCR test negative.  TSH elevated at 8.3, however, free T4 grossly normal (1.23). Chest x-ray  showing no active disease. Head CT negative for acute intracranial abnormality. CT abdomen pelvis with findings concerning for infectious or inflammatory enterocolitis.  No evidence of bowel obstruction. Patient received Tylenol, NovoLog 10 units, Cipro, Flagyl, and 1 L normal saline bolus.    Review of Systems:  All systems reviewed and apart from history of presenting illness, are negative.  Past Medical History:  Diagnosis Date  . Arthritis    "knees, legs" (06/10/2016)  . Ascites   . Chronic kidney disease    "related to my diabetes"  . Chronic lower back pain   . Diabetes mellitus without complication ([place])   . Eczema   . GERD (gastroesophageal reflux disease)   . High cholesterol   . Hyperlipidemia   . Hypertension   . Hypothyroidism   . OAB (overactive bladder)   . Osteoporosis   . Thyroid disease   . Type II diabetes mellitus ([place])   . Urticaria     Past Surgical History:  Procedure Laterality Date  . ABDOMINAL HYSTERECTOMY    . KNEE ARTHROSCOPY    . LAPAROTOMY N/A 12/03/2016   Procedure: EXPLORATORY LAPAROTOMY, LYSIS OF ADHESIONS;  Surgeon: Armandina Gemma, MD;  Location: [facility];  Service: General;  Laterality: N/A;  . vocal cord polyps       reports that she has never smoked. She quit smokeless tobacco use about 36 years ago.  Her smokeless tobacco use included chew. She reports that she does not drink alcohol or use drugs.  Allergies  Allergen Reactions  . Sulfonamide Derivatives  Hives  . Bactrim [Sulfamethoxazole-Trimethoprim] Rash    Reported by [facility]  . Lisinopril Cough    Reported by [facility]    Family History  Problem Relation Age of Onset  . Diabetes Mother   . Hypertension Mother   . Diabetes Sister   . Diabetes Brother   . Asthma Father   . Allergic rhinitis Neg Hx   . Eczema Neg Hx   . Urticaria Neg Hx     Prior to Admission medications   Medication Sig Start Date End Date Taking? Authorizing Provider  acetaminophen  (TYLENOL) 500 MG tablet Take 500 mg by mouth every 6 (six) hours as needed.    [provider]  alendronate (FOSAMAX) 70 MG tablet TAKE 1 TABLET BY MOUTH EVERY 7 DAYS ON AN EMPTY STOMACH WITH A FULL GLASS OF WATER 06/27/18   Princess Bruins, MD  aspirin EC 81 MG tablet Take 81 mg by mouth daily.    [provider]  atorvastatin (LIPITOR) 40 MG tablet TK 1 T PO QD FOR CHOLESTEROL 02/16/19   [provider]  B-D UF III MINI PEN NEEDLES 31G X 5 MM MISC USE AS DIRECTED TID 02/14/19   [provider]  calcium-vitamin D 250-100 MG-UNIT tablet Take 2 tablets by mouth daily.    [provider]  cholecalciferol (VITAMIN D) 1000 units tablet Take 1,000 Units by mouth daily.    [provider]  estradiol (ESTRACE) 0.1 MG/GM vaginal cream INSERT 1/4 APPLICATORFUL VAGINALLY TWICE WEEKLY( MONDAY AND THURSDAY) 09/21/18   Princess Bruins, MD  FLUAD 0.5 ML SUSY ADM 0.5ML IM UTD 03/09/18   [provider]  insulin aspart (NOVOLOG) 100 UNIT/ML injection Inject 6-8 Units into the skin 3 (three) times daily before meals. SLIDING SCALE: 70-199= 6 UNITS 200-299= 7 UNITS >300= 8 UNITS    [provider]  Insulin Detemir (LEVEMIR FLEXTOUCH) 100 UNIT/ML Pen Inject 7 Units into the skin 2 (two) times daily.     [provider]  Iron-FA-B Cmp-C-Biot-Probiotic (FUSION PLUS) CAPS TK 1 C PO QD 03/02/19   [provider]  levothyroxine (SYNTHROID, LEVOTHROID) 100 MCG tablet Take 100 mcg by mouth daily before breakfast.    [provider]  losartan (COZAAR) 50 MG tablet TK 1 T PO QD 01/24/19   [provider]  mometasone (NASONEX) 50 MCG/ACT nasal spray Place 2 sprays into the nose daily as needed (nasal congestion). 04/05/19   Kennith Gain, MD  Multiple Vitamin (MULTIVITAMIN WITH MINERALS) TABS tablet Take 1 tablet by mouth daily.    [provider]  olopatadine (PATANOL) 0.1 % ophthalmic solution Place 1  drop into both eyes 2 (two) times daily. 03/18/18   Kennith Gain, MD  olopatadine (PATANOL) 0.1 % ophthalmic solution Place 1 drop into both eyes 2 (two) times daily. 04/05/19   Kennith Gain, MD  Omega-3 Fatty Acids (FISH OIL) 1000 MG CAPS Take 1,000 mg by mouth 2 (two) times daily.    [provider]  pantoprazole (PROTONIX) 40 MG tablet TK 1 T PO QD 03/25/19   [provider]  polyethylene glycol (MIRALAX / GLYCOLAX) packet Take 17 g by mouth every Monday, Wednesday, and Friday.    [provider]    Physical Exam: Vitals:   08/18/19 0345 08/18/19 0400 08/18/19 0452 08/18/19 0552  BP:   (!) 134/48 (!) 150/55  Pulse: 80 74 77 78  Resp: 12 15 16 18   Temp:   99.5 F (  37.5 C) 99.6 F (37.6 C)  TempSrc:   Oral Oral  SpO2: 100% 99% 96% 98%  Weight:    63.5 kg    Physical Exam  Constitutional: She appears well-developed and well-nourished. No distress.  HENT:  Head: Normocephalic.  Dry mucous membranes  Eyes: Right eye exhibits no discharge. Left eye exhibits no discharge.  Neck:  No nuchal rigidity  Cardiovascular: Normal rate, regular rhythm and intact distal pulses.  Pulmonary/Chest: Effort normal and breath sounds normal. No respiratory distress. She has no wheezes. She has no rales.  Abdominal: Soft. Bowel sounds are normal. She exhibits no distension. There is no abdominal tenderness. There is no guarding.  Musculoskeletal:        General: No edema.     Cervical back: Neck supple.  Neurological:  AAO x3 but appears confused and not following commands appropriately No focal motor or sensory deficit  Skin: Skin is warm and dry. She is not diaphoretic.     Labs on Admission: I have personally reviewed following labs and imaging studies  CBC: Recent Labs  Lab 08/17/19 2247  WBC 9.1  NEUTROABS 8.2*  HGB 12.1  HCT 36.5  MCV 96.6  PLT [id number]   Basic Metabolic Panel: Recent Labs  Lab 08/17/19 2247  NA 131*  K 4.4  CL  92*  CO2 22  GLUCOSE 454*  BUN 22  CREATININE 1.29*  CALCIUM 10.3   GFR: Estimated Creatinine Clearance: 30.4 mL/min (A) (by C-G formula based on SCr of 1.29 mg/dL (H)). Liver Function Tests: Recent Labs  Lab 08/17/19 2247  AST 35  ALT 32  ALKPHOS 85  BILITOT 0.7  PROT 8.0  ALBUMIN 4.1   No results for input(s): LIPASE, AMYLASE in the last 168 hours. No results for input(s): AMMONIA in the last 168 hours. Coagulation Profile: Recent Labs  Lab 08/17/19 2314  INR 0.9   Cardiac Enzymes: No results for input(s): CKTOTAL, CKMB, CKMBINDEX, TROPONINI in the last 168 hours. BNP (last 3 results) No results for input(s): PROBNP in the last 8760 hours. HbA1C: No results for input(s): HGBA1C in the last 72 hours. CBG: Recent Labs  Lab 08/17/19 2330 08/18/19 0148 08/18/19 0319  GLUCAP 451* 475* 391*   Lipid Profile: No results for input(s): CHOL, HDL, LDLCALC, TRIG, CHOLHDL, LDLDIRECT in the last 72 hours. Thyroid Function Tests: Recent Labs    08/17/19 2322  TSH 8.334*  FREET4 1.23*   Anemia Panel: No results for input(s): VITAMINB12, FOLATE, FERRITIN, TIBC, IRON, RETICCTPCT in the last 72 hours. Urine analysis:    Component Value Date/Time   COLORURINE YELLOW 08/17/2019 2317   APPEARANCEUR HAZY (A) 08/17/2019 2317   LABSPEC 1.015 08/17/2019 2317   PHURINE 8.0 08/17/2019 2317   GLUCOSEU >=500 (A) 08/17/2019 2317   HGBUR NEGATIVE 08/17/2019 2317   BILIRUBINUR NEGATIVE 08/17/2019 2317   BILIRUBINUR neg 05/06/2015 1421   KETONESUR 20 (A) 08/17/2019 2317   PROTEINUR 100 (A) 08/17/2019 2317   UROBILINOGEN 1.0 05/06/2015 1421   UROBILINOGEN 0.2 12/25/2014 1206   NITRITE NEGATIVE 08/17/2019 2317   LEUKOCYTESUR NEGATIVE 08/17/2019 2317    Radiological Exams on Admission: CT Head Wo Contrast  Result Date: 08/18/2019 CLINICAL DATA:  Altered mental status EXAM: CT HEAD WITHOUT CONTRAST TECHNIQUE: Contiguous axial images were obtained from the base of the skull  through the vertex without intravenous contrast. COMPARISON:  12/21/2016 FINDINGS: Brain: Stable chronic periventricular small vessel disease. No acute intracranial abnormality. Specifically, no hemorrhage, hydrocephalus, mass lesion, acute infarction, or  significant intracranial injury. Vascular: No hyperdense vessel or unexpected calcification. Skull: No acute calvarial abnormality. Sinuses/Orbits: Visualized paranasal sinuses and mastoids clear. Orbital soft tissues unremarkable. Other: None IMPRESSION: Chronic small vessel disease.  No acute intracranial abnormality. Electronically Signed   By: Rolm Baptise M.D.   On: 08/18/2019 00:55   CT ABDOMEN PELVIS W CONTRAST  Result Date: 08/18/2019 CLINICAL DATA:  Bowel obstruction suspected EXAM: CT ABDOMEN AND PELVIS WITH CONTRAST TECHNIQUE: Multidetector CT imaging of the abdomen and pelvis was performed using the standard protocol following bolus administration of intravenous contrast. CONTRAST:  167mL OMNIPAQUE IOHEXOL 300 MG/ML  SOLN COMPARISON:  CT abdomen pelvis Dec 01, 2016 FINDINGS: Lower chest: Lung bases are clear. Normal heart size. No pericardial effusion. Coronary artery calcifications are noted. Hepatobiliary: Diffuse hepatic hypoattenuation compatible with hepatic steatosis. No focal liver abnormality is seen. Gallbladder is decompressed. Radiodensity at the gallbladder fossa is in similar position to prior though difficult to fully visualize given extensive respiratory motion artifact in the upper abdomen. No gross biliary ductal dilatation is seen. Pancreas: Unremarkable. No pancreatic ductal dilatation or surrounding inflammatory changes. Spleen: Normal in size without focal abnormality. Adrenals/Urinary Tract: Normal adrenal glands. 2.3 cm hypoattenuating cyst seen in the lower pole left kidney. No concerning renal lesions. No visible urolithiasis or hydronephrosis. Circumferential bladder wall thickening with some adjacent perivesicular haze.  Stomach/Bowel: Distal esophagus, stomach and duodenum are unremarkable. Much of the small bowel is decompressed. The bowel is difficult to fully assess given extensive motion artifact and a paucity of intraperitoneal fat though question several loops of small bowel in the mid abdomen which appear slightly thickened for example coronal series 6 image 63. no evidence of bowel obstruction is seen however. The appendix is not well visualized. No focal pericecal inflammation to suggest an occult appendicitis. Moderate volume of stool throughout the colon. Mild pericolonic edematous changes are noted diffusely. Vascular/Lymphatic: Extensive atherosclerotic plaque is seen throughout the aorta and branch vessels. Question near complete occlusion of the proximal left common iliac artery. No aneurysm or ectasia. Reproductive: Uterus is surgically absent. No concerning adnexal lesions. Other: Increased attenuation of mesenteric, may be reactive stranding or related to hydration status. No free air or free fluid is seen. No bowel containing hernias. Postsurgical changes of the low anterior abdominal wall. Musculoskeletal: Multilevel degenerative changes are present in the imaged portions of the spine. No acute osseous abnormality or suspicious osseous lesion. The osseous structures appear diffusely demineralized which may limit detection of small or nondisplaced fractures. IMPRESSION: 1. Much of the small bowel is decompressed though question several loops of small bowel in the mid abdomen appear slightly thickened. Additional faint pericolonic hazy stranding is noted as well. Findings could reflect an infectious or inflammatory enterocolitis. No evidence of bowel obstruction is seen however. 2. Circumferential bladder wall thickening with some adjacent perivesicular haze. Correlate with urinalysis to exclude underlying cystitis. 3. Hepatic steatosis. 4. Extensive atherosclerotic plaque throughout the aorta and branch vessels.  Likely near complete occlusion of the proximal left common iliac artery. Electronically Signed   By: Lovena Le M.D.   On: 08/18/2019 03:14   DG Chest Portable 1 View  Result Date: 08/17/2019 CLINICAL DATA:  Fever and altered mental status. EXAM: PORTABLE CHEST 1 VIEW COMPARISON:  December 21, 2016 FINDINGS: There is no evidence of acute infiltrate, pleural effusion or pneumothorax. The heart size and mediastinal contours are within normal limits. There is moderate severity calcification of the aortic arch. The visualized skeletal structures are unremarkable.  IMPRESSION: No active disease. Electronically Signed   By: Virgina Norfolk M.D.   On: 08/17/2019 23:44    EKG: Independently reviewed.  Sinus rhythm, PR prolongation, incomplete RBBB.  No significant change since prior tracing.  Assessment/Plan Principal Problem:   Enterocolitis Active Problems:   Type 2 diabetes mellitus without complication, with long-term current use of insulin (HCC)   AKI (acute kidney injury) ([place])   Acute encephalopathy   Hyperglycemia   Enterocolitis Noted to have a low-grade fever in the ED.  No leukocytosis.  No signs of sepsis.  CT abdomen pelvis with findings concerning for infectious or inflammatory enterocolitis.  -Continue Cipro and Flagyl -Antiemetic as needed -IV fluid hydration -Check lactic acid level -Blood culture x2 pending  Acute metabolic encephalopathy Suspect related to underlying infection/enterocolitis and dehydration.  Patient recently started taking an over-the-counter herbal supplement for neuropathic pain, question whether an ingredient in this supplement is contributing.  Head CT negative for acute intracranial abnormality. TSH elevated, however, free T4 grossly normal.  No leukocytosis or meningeal signs to suggest meningitis.  UA without evidence of infection.  Patient is AAO x3 but appears confused and not following commands appropriately.  No focal neuro deficit. -Management of  enterocolitis as mentioned above.  If no improvement, consider obtaining brain MRI in a.m. -Check UDS -Check ammonia level  Hyperglycemia in the setting of insulin-dependent diabetes Blood glucose 454 on arrival.  Labs not suggestive of DKA.  Patient received NovoLog 10 units and 1 L fluid bolus.  Blood glucose improved to 391. -Sliding scale insulin moderate ACHS and CBG checks -Resume home insulin after pharmacy med rec is complete  AKI on CKD stage II-III Creatinine 1.2, baseline 0.6-0.9.  Likely prerenal from dehydration and home ARB use. -IV fluid hydration -Monitor renal function and urine output -Avoid nephrotoxic agents  Hypothyroidism TSH elevated at 8.3, however, free T4 grossly normal (1.23). -Resume home Synthroid after pharmacy med rec is complete  Hypertension Blood pressure was elevated on arrival, now improved. -Hydralazine as needed for SBP >160  Pharmacy med rec pending.  DVT prophylaxis: Lovenox Code Status: Full code Family Communication: No family available at this time. Disposition Plan: Anticipate discharge after clinical improvement. Consults called: None Admission status: It is my clinical opinion that referral for OBSERVATION is reasonable and necessary in this patient based on the above information provided. The aforementioned taken together are felt to place the patient at high risk for further clinical deterioration. However it is anticipated that the patient may be medically stable for discharge from the hospital within 24 to 48 hours.  The medical decision making on this patient was of high complexity and the patient is at high risk for clinical deterioration, therefore this is a level 3 visit.  Shela Leff MD [facility]  If 7PM-7AM, please contact night-coverage [email or web address] Password [facility]  08/18/2019, 6:45 AM

## 2019-08-18 NOTE — Progress Notes (Signed)
New Admission Note:   Arrival Method: Arrived via stretcher from [facility] ED Mental Orientation: Alert and oriented to person Telemetry: Box #16 Assessment: Completed Skin: Intact IV: NSL-Rt AC Pain: Denies Tubes: N/A Safety Measures: Safety Fall Prevention Plan has been discussed.  Admission: Completed 5MW Orientation: Patient has been orientated to the room, unit and staff.  Family: None at bedside  Orders have been reviewed and implemented. Will continue to monitor the patient. Call light has been placed within reach and bed alarm has been activated.   Addilynne Olheiser [employer], RN-BC Phone number: [phone]

## 2019-08-18 NOTE — Progress Notes (Signed)
[facility] PROGRESS NOTE  IRIHANNA PRESSNELL [id number] DOB: 1933/12/31 DOA: 08/17/2019 PCP: Lajean Manes, MD  Assessment/Plan: Enterocolitis. Max temp 99.6.  No leukocytosis.  No signs of sepsis.  CT abdomen pelvis with findings concerning for infectious or inflammatory enterocolitis.  Lactic acid 1.3.  Blood cultures no growth to date.  Complains of intermittent abdominal cramping this a.m.  No emesis or diarrhea since admission. -Continue Cipro and Flagyl -Antiemetic as needed -IV fluid hydration  Acute metabolic encephalopathy.  Improving this a.m.  She is oriented to self and place. Head CT negative for acute intracranial abnormality. TSH elevated, however, free T4 grossly normal.  No leukocytosis or meningeal signs to suggest meningitis.  UA without evidence of infection.  -Management of enterocolitis as mentioned above.   -Check UDS -Check ammonia level  Hyperglycemia in the setting of insulin-dependent diabetes Blood glucose 454 on arrival.  Labs not suggestive of DKA.  Patient received NovoLog 10 units and 1 L fluid bolus.  Blood glucose improved to 391. -Sliding scale insulin moderate ACHS and CBG checks -Resume home insulin after pharmacy med rec is complete  AKI on CKD stage II-III Creatinine trending up this am to 1.4, baseline 0.6-0.9.  Likely prerenal from dehydration and home ARB use. -IV fluid hydration -Monitor renal function and urine output -Avoid nephrotoxic agents -holding home ARB -recheck in am  Hypothyroidism. TSH elevated at 8.3, however, free T4 grossly normal (1.23). home meds include synthroid -increase synthroid to 134mcg -OP follow up  Hypertension Blood pressure was elevated on arrival no on high end of normal. -Hydralazine as needed for SBP >160 -holding home ARB as noted above    Code Status: full Family Communication: daughter on phone Disposition Plan: home when  ready   Consultants:    Procedures:    Antibiotics:  Flagyl 1/28>>  cipro 1/28>>  HPI/Subjective: Awake alert no acute distress.  Reports abdominal "cramping".  Denies nausea  Objective: Vitals:   08/18/19 0552 08/18/19 0917  BP: (!) 150/55 (!) 155/52  Pulse: 78 86  Resp: 18 16  Temp: 99.6 F (37.6 C) 98.6 F (37 C)  SpO2: 98% 97%    Intake/Output Summary (Last 24 hours) at 08/18/2019 1148 Last data filed at 08/18/2019 [id number] Gross per 24 hour  Intake 99.33 ml  Output 250 ml  Net -150.67 ml   Filed Weights   08/18/19 0552  Weight: 63.5 kg    Exam:   General:  Obese alert no acute distress  Cardiovascular: rrr no mgr no LE edema  Respiratory: normal effort BS clear bilaterally no wheeze  Abdomen: obese soft +BS but sluggish mild tenderness lower quadrants to palpation, no guarding or rebounding  Musculoskeletal: joints without swelling/erythema  Neuro: alert oriented to self and place. Speech clear facial symmetry bilateral grip 5/5   Data Reviewed: Basic Metabolic Panel: Recent Labs  Lab 08/17/19 2247 08/18/19 0826  NA 131* 131*  K 4.4 4.4  CL 92* 94*  CO2 22 23  GLUCOSE 454* 457*  BUN 22 21  CREATININE 1.29* 1.41*  CALCIUM 10.3 9.8   Liver Function Tests: Recent Labs  Lab 08/17/19 2247  AST 35  ALT 32  ALKPHOS 85  BILITOT 0.7  PROT 8.0  ALBUMIN 4.1   No results for input(s): LIPASE, AMYLASE in the last 168 hours. Recent Labs  Lab 08/18/19 0828  AMMONIA 27   CBC: Recent Labs  Lab 08/17/19 2247  WBC 9.1  NEUTROABS 8.2*  HGB 12.1  HCT 36.5  MCV  96.6  PLT 242   Cardiac Enzymes: No results for input(s): CKTOTAL, CKMB, CKMBINDEX, TROPONINI in the last 168 hours. BNP (last 3 results) No results for input(s): BNP in the last 8760 hours.  ProBNP (last 3 results) No results for input(s): PROBNP in the last 8760 hours.  CBG: Recent Labs  Lab 08/17/19 2330 08/18/19 0148 08/18/19 0319 08/18/19 0658 08/18/19 1146   GLUCAP 451* 475* 391* 417* 264*    Recent Results (from the past 240 hour(s))  Blood culture (routine x 2)     Status: None (Preliminary result)   Collection Time: 08/17/19 12:36 AM   Specimen: BLOOD LEFT HAND  Result Value Ref Range Status   Specimen Description BLOOD LEFT HAND  Final   Special Requests   Final    BOTTLES DRAWN AEROBIC AND ANAEROBIC Blood Culture results may not be optimal due to an inadequate volume of blood received in culture bottles   Culture   Final    NO GROWTH < 12 HOURS Performed at [facility], [place] [address] [place], [place] [address]    Report Status PENDING  Incomplete  Blood culture (routine x 2)     Status: None (Preliminary result)   Collection Time: 08/17/19 12:37 AM   Specimen: BLOOD  Result Value Ref Range Status   Specimen Description BLOOD RIGHT ANTECUBITAL  Final   Special Requests   Final    BOTTLES DRAWN AEROBIC AND ANAEROBIC Blood Culture results may not be optimal due to an inadequate volume of blood received in culture bottles   Culture   Final    NO GROWTH < 12 HOURS Performed at [facility], [place] [address] [place], [place] [address]    Report Status PENDING  Incomplete  Respiratory Panel by RT PCR (Flu A&B, Covid) - Nasopharyngeal Swab     Status: None   Collection Time: 08/17/19 11:15 PM   Specimen: Nasopharyngeal Swab  Result Value Ref Range Status   SARS Coronavirus 2 by RT PCR NEGATIVE NEGATIVE Final    Comment: (NOTE) SARS-CoV-2 target nucleic acids are NOT DETECTED. The SARS-CoV-2 RNA is generally detectable in upper respiratoy specimens during the acute phase of infection. The lowest concentration of SARS-CoV-2 viral copies this assay can detect is 131 copies/mL. A negative result does not preclude SARS-Cov-2 infection and should not be used as the sole basis for treatment or other patient management decisions. A negative result may occur with  improper specimen collection/handling, submission of specimen  other than nasopharyngeal swab, presence of viral mutation(s) within the areas targeted by this assay, and inadequate number of viral copies (<131 copies/mL). A negative result must be combined with clinical observations, patient history, and epidemiological information. The expected result is Negative. Fact Sheet for Patients:  PinkCheek.be Fact Sheet for Healthcare Providers:  GravelBags.it This test is not yet ap proved or cleared by the [place] FDA and  has been authorized for detection and/or diagnosis of SARS-CoV-2 by FDA under an Emergency Use Authorization (EUA). This EUA will remain  in effect (meaning this test can be used) for the duration of the COVID-19 declaration under Section 564(b)(1) of the Act, 21 U.S.C. section 360bbb-3(b)(1), unless the authorization is terminated or revoked sooner.    Influenza A by PCR NEGATIVE NEGATIVE Final   Influenza B by PCR NEGATIVE NEGATIVE Final    Comment: (NOTE) The Xpert Xpress SARS-CoV-2/FLU/RSV assay is intended as an aid in  the diagnosis of influenza from Nasopharyngeal swab specimens and  should not be used as a sole basis for treatment. Nasal washings and  aspirates are unacceptable for Xpert Xpress SARS-CoV-2/FLU/RSV  testing. Fact Sheet for Patients: PinkCheek.be Fact Sheet for Healthcare Providers: GravelBags.it This test is not yet approved or cleared by the [place] FDA and  has been authorized for detection and/or diagnosis of SARS-CoV-2 by  FDA under an Emergency Use Authorization (EUA). This EUA will remain  in effect (meaning this test can be used) for the duration of the  Covid-19 declaration under Section 564(b)(1) of the Act, 21  U.S.C. section 360bbb-3(b)(1), unless the authorization is  terminated or revoked. Performed at [facility], [place] [address], [place], [place] [address]       Studies: CT Head Wo Contrast  Result Date: 08/18/2019 CLINICAL DATA:  Altered mental status EXAM: CT HEAD WITHOUT CONTRAST TECHNIQUE: Contiguous axial images were obtained from the base of the skull through the vertex without intravenous contrast. COMPARISON:  12/21/2016 FINDINGS: Brain: Stable chronic periventricular small vessel disease. No acute intracranial abnormality. Specifically, no hemorrhage, hydrocephalus, mass lesion, acute infarction, or significant intracranial injury. Vascular: No hyperdense vessel or unexpected calcification. Skull: No acute calvarial abnormality. Sinuses/Orbits: Visualized paranasal sinuses and mastoids clear. Orbital soft tissues unremarkable. Other: None IMPRESSION: Chronic small vessel disease.  No acute intracranial abnormality. Electronically Signed   By: Rolm Baptise M.D.   On: 08/18/2019 00:55   CT ABDOMEN PELVIS W CONTRAST  Result Date: 08/18/2019 CLINICAL DATA:  Bowel obstruction suspected EXAM: CT ABDOMEN AND PELVIS WITH CONTRAST TECHNIQUE: Multidetector CT imaging of the abdomen and pelvis was performed using the standard protocol following bolus administration of intravenous contrast. CONTRAST:  145mL OMNIPAQUE IOHEXOL 300 MG/ML  SOLN COMPARISON:  CT abdomen pelvis Dec 01, 2016 FINDINGS: Lower chest: Lung bases are clear. Normal heart size. No pericardial effusion. Coronary artery calcifications are noted. Hepatobiliary: Diffuse hepatic hypoattenuation compatible with hepatic steatosis. No focal liver abnormality is seen. Gallbladder is decompressed. Radiodensity at the gallbladder fossa is in similar position to prior though difficult to fully visualize given extensive respiratory motion artifact in the upper abdomen. No gross biliary ductal dilatation is seen. Pancreas: Unremarkable. No pancreatic ductal dilatation or surrounding inflammatory changes. Spleen: Normal in size without focal abnormality. Adrenals/Urinary Tract: Normal adrenal glands. 2.3 cm  hypoattenuating cyst seen in the lower pole left kidney. No concerning renal lesions. No visible urolithiasis or hydronephrosis. Circumferential bladder wall thickening with some adjacent perivesicular haze. Stomach/Bowel: Distal esophagus, stomach and duodenum are unremarkable. Much of the small bowel is decompressed. The bowel is difficult to fully assess given extensive motion artifact and a paucity of intraperitoneal fat though question several loops of small bowel in the mid abdomen which appear slightly thickened for example coronal series 6 image 63. no evidence of bowel obstruction is seen however. The appendix is not well visualized. No focal pericecal inflammation to suggest an occult appendicitis. Moderate volume of stool throughout the colon. Mild pericolonic edematous changes are noted diffusely. Vascular/Lymphatic: Extensive atherosclerotic plaque is seen throughout the aorta and branch vessels. Question near complete occlusion of the proximal left common iliac artery. No aneurysm or ectasia. Reproductive: Uterus is surgically absent. No concerning adnexal lesions. Other: Increased attenuation of mesenteric, may be reactive stranding or related to hydration status. No free air or free fluid is seen. No bowel containing hernias. Postsurgical changes of the low anterior abdominal wall. Musculoskeletal: Multilevel degenerative changes are present in the imaged portions of the spine. No acute osseous abnormality or  suspicious osseous lesion. The osseous structures appear diffusely demineralized which may limit detection of small or nondisplaced fractures. IMPRESSION: 1. Much of the small bowel is decompressed though question several loops of small bowel in the mid abdomen appear slightly thickened. Additional faint pericolonic hazy stranding is noted as well. Findings could reflect an infectious or inflammatory enterocolitis. No evidence of bowel obstruction is seen however. 2. Circumferential bladder wall  thickening with some adjacent perivesicular haze. Correlate with urinalysis to exclude underlying cystitis. 3. Hepatic steatosis. 4. Extensive atherosclerotic plaque throughout the aorta and branch vessels. Likely near complete occlusion of the proximal left common iliac artery. Electronically Signed   By: Lovena Le M.D.   On: 08/18/2019 03:14   DG Chest Portable 1 View  Result Date: 08/17/2019 CLINICAL DATA:  Fever and altered mental status. EXAM: PORTABLE CHEST 1 VIEW COMPARISON:  December 21, 2016 FINDINGS: There is no evidence of acute infiltrate, pleural effusion or pneumothorax. The heart size and mediastinal contours are within normal limits. There is moderate severity calcification of the aortic arch. The visualized skeletal structures are unremarkable. IMPRESSION: No active disease. Electronically Signed   By: Virgina Norfolk M.D.   On: 08/17/2019 23:44    Scheduled Meds: . enoxaparin (LOVENOX) injection  30 mg Subcutaneous Q24H  . insulin aspart  0-15 Units Subcutaneous TID WC  . insulin aspart  0-5 Units Subcutaneous QHS  . insulin aspart  3 Units Subcutaneous TID WC  . insulin detemir  8 Units Subcutaneous BID   Continuous Infusions: . sodium chloride 125 mL/hr at 08/18/19 0710  . ciprofloxacin    . metronidazole      Principal Problem:   Enterocolitis Active Problems:   AKI (acute kidney injury) ([place])   Acute encephalopathy   Hyperglycemia   Hypothyroidism, acquired   Type 2 diabetes mellitus without complication, with long-term current use of insulin ([place])   Essential hypertension    Time spent: 31 minutes    [place] NP  [facility]  If 7PM-7AM, please contact night-coverage at [email or web address] 08/18/2019, 11:48 AM  LOS: 0 days

## 2019-08-18 NOTE — Progress Notes (Signed)
Inpatient Diabetes Program Recommendations  AACE/ADA: New Consensus Statement on Inpatient Glycemic Control (2015)  Target Ranges:  Prepandial:   less than 140 mg/dL      Peak postprandial:   less than 180 mg/dL (1-2 hours)      Critically ill patients:  140 - 180 mg/dL   Lab Results  Component Value Date   GLUCAP 417 (H) 08/18/2019   HGBA1C 8.6 (H) 08/18/2019    Review of Glycemic Control Results for CADEY, TESFAY (MRN [id number]) as of 08/18/2019 10:38  Ref. Range 08/17/2019 23:30 08/18/2019 01:48 08/18/2019 03:19 08/18/2019 06:58  Glucose-Capillary Latest Ref Range: 70 - 99 mg/dL 451 (H) 475 (H) 391 (H) 417 (H)   Diabetes history: DM 2 Outpatient Diabetes medications:  Levemir 7 units bid, Novolog 6-8 units  Current orders for Inpatient glycemic control:  Novolog moderate tid with meals and HS Levemir 10 units daily Inpatient Diabetes Program Recommendations:   Please consider increasing Levemir to 8 units bid.   Also please add Novolog 3 units tid with meals (hold if patient eats <50%).   Thanks  Adah Perl, RN, BC-ADM Inpatient Diabetes Coordinator Pager [phone]

## 2019-08-18 NOTE — Progress Notes (Signed)
Pharmacy Antibiotic Note  [name] [name] is a 86 y.o. female admitted on 08/17/2019 with intra-abdominal infection.  Pharmacy has been consulted for Cipro dosing. WBC ok. Mild bump in Scr.   Plan: Cipro 400 mg IV q12h (if renal function declines any further will need q24h) Flagyl per MD Trend WBC, temp F/U infectious work-up   Temp (24hrs), Avg:99.7 F (37.6 C), Min:99.5 F (37.5 C), Max:100 F (37.8 C)  Recent Labs  Lab 08/17/19 2247  WBC 9.1  CREATININE 1.29*    CrCl cannot be calculated (Unknown ideal weight.).    Allergies  Allergen Reactions  . Sulfonamide Derivatives Hives  . Bactrim [Sulfamethoxazole-Trimethoprim] Rash    Reported by [facility]  . Lisinopril Cough    Reported by [facility]   Narda Bonds, PharmD, [place] Clinical Pharmacist Phone: [phone]

## 2019-08-18 NOTE — Evaluation (Signed)
Physical Therapy Evaluation Patient Details Name: [name] [name] MRN: [id number] DOB: 10-17-1933 Today's Date: 08/18/2019   History of Present Illness  Pt is an 86 y/o female admitted secondary to increased confusion and vomiting; possibly secondary to medication. Imaging revealed enterocolitis. PMH includes DM, HTN, and CKD.   Clinical Impression  Pt admitted secondary to problem above with deficits below. Pt with mild unsteadiness requiring min to min guard for mobility tasks. [name] reports they plan to stay with pt upon d/c. Educated about follow up recommendations. Will continue to follow acutely to maximize functional mobility independence and safety.     Follow Up Recommendations Home health PT;Supervision/Assistance - 24 hour(24/7 initially )    Equipment Recommendations  None recommended by PT    Recommendations for Other Services       Precautions / Restrictions Precautions Precautions: Fall Restrictions Weight Bearing Restrictions: No      Mobility  Bed Mobility Overal bed mobility: Needs Assistance Bed Mobility: Supine to Sit     Supine to sit: Supervision     General bed mobility comments: Supervision for safety.   Transfers Overall transfer level: Needs assistance Equipment used: None Transfers: Sit to/from Stand Sit to Stand: Min assist         General transfer comment: Min A for lift assist and steadying to stand.   Ambulation/Gait Ambulation/Gait assistance: Min guard Gait Distance (Feet): 120 Feet Assistive device: IV Pole Gait Pattern/deviations: Step-through pattern;Decreased stride length Gait velocity: Decreased   General Gait Details: Very slow, cautious gait. Mild unsteadiness, however, no LOB noted. Pt using IV pole for support.   Stairs            Wheelchair Mobility    Modified Rankin (Stroke Patients Only)       Balance Overall balance assessment: Needs assistance Sitting-balance support: No upper extremity  supported;Feet supported Sitting balance-Leahy Scale: Fair     Standing balance support: Single extremity supported;During functional activity Standing balance-Leahy Scale: Fair Standing balance comment: Able to maintain static standing without UE support                              Pertinent Vitals/Pain Pain Assessment: No/denies pain    Home Living Family/patient expects to be discharged to:: Private residence Living Arrangements: Alone Available Help at Discharge: Family;Available 24 hours/day Type of Home: House Home Access: Stairs to enter Entrance Stairs-Rails: None Entrance Stairs-Number of Steps: 3 Home Layout: One level Home Equipment: Environmental consultant - 2 wheels Additional Comments: [name] reports someone will be staying with her upon d/c     Prior Function Level of Independence: Independent               Hand Dominance        Extremity/Trunk Assessment   Upper Extremity Assessment Upper Extremity Assessment: Overall WFL for tasks assessed    Lower Extremity Assessment Lower Extremity Assessment: Generalized weakness    Cervical / Trunk Assessment Cervical / Trunk Assessment: Normal  Communication   Communication: HOH  Cognition Arousal/Alertness: Awake/alert Behavior During Therapy: WFL for tasks assessed/performed Overall Cognitive Status: Impaired/Different from baseline Area of Impairment: Problem solving                             Problem Solving: Slow processing        General Comments General comments (skin integrity, edema, etc.): [name] present during session  Exercises     Assessment/Plan    PT Assessment Patient needs continued PT services  PT Problem List Decreased strength;Decreased balance;Decreased mobility;Decreased cognition;Decreased safety awareness;Decreased knowledge of precautions;Decreased knowledge of use of DME       PT Treatment Interventions Stair training;Gait  training;Therapeutic activities;Functional mobility training;Therapeutic exercise;Balance training;Patient/family education    PT Goals (Current goals can be found in the Care Plan section)  Acute Rehab PT Goals Patient Stated Goal: to go home PT Goal Formulation: With patient Time For Goal Achievement: 09/01/19 Potential to Achieve Goals: Good    Frequency Min 3X/week   Barriers to discharge        Co-evaluation               AM-PAC PT "6 Clicks" Mobility  Outcome Measure Help needed turning from your back to your side while in a flat bed without using bedrails?: None Help needed moving from lying on your back to sitting on the side of a flat bed without using bedrails?: A Little Help needed moving to and from a bed to a chair (including a wheelchair)?: A Little Help needed standing up from a chair using your arms (e.g., wheelchair or bedside chair)?: A Little Help needed to walk in hospital room?: A Little Help needed climbing 3-5 steps with a railing? : A Lot 6 Click Score: 18    End of Session Equipment Utilized During Treatment: Gait belt Activity Tolerance: Patient tolerated treatment well Patient left: in chair;with call bell/phone within reach;with family/visitor present Nurse Communication: Mobility status PT Visit Diagnosis: Muscle weakness (generalized) (M62.81);Unsteadiness on feet (R26.81)    Time: [id number] PT Time Calculation (min) (ACUTE ONLY): 20 min   Charges:   PT Evaluation $PT Eval Low Complexity: 1 Low          Lou Miner, DPT  Acute Rehabilitation Services  Pager: [phone] Office: [phone]   Rudean Hitt 08/18/2019, 3:38 PM

## 2019-08-18 NOTE — Progress Notes (Signed)
Report received. Room ready.  

## 2019-08-19 LAB — BASIC METABOLIC PANEL
Anion gap: 8 (ref 5–15)
BUN: 17 mg/dL (ref 8–23)
CO2: 28 mmol/L (ref 22–32)
Calcium: 9.1 mg/dL (ref 8.9–10.3)
Chloride: 98 mmol/L (ref 98–111)
Creatinine, Ser: 1.28 mg/dL — ABNORMAL HIGH (ref 0.44–1.00)
GFR calc Af Amer: 45 mL/min — ABNORMAL LOW (ref >60)
GFR calc non Af Amer: 39 mL/min — ABNORMAL LOW (ref >60)
Glucose, Bld: 77 mg/dL (ref 70–99)
Potassium: 3.6 mmol/L (ref 3.5–5.1)
Sodium: 134 mmol/L — ABNORMAL LOW (ref 135–145)

## 2019-08-19 LAB — GLUCOSE, CAPILLARY
Glucose-Capillary: 82 mg/dL (ref 70–99)
Glucose-Capillary: 149 mg/dL — ABNORMAL HIGH (ref 70–99)
Glucose-Capillary: 502 mg/dL (ref 70–99)
Glucose-Capillary: 340 mg/dL — ABNORMAL HIGH (ref 70–99)
Glucose-Capillary: 165 mg/dL — ABNORMAL HIGH (ref 70–99)

## 2019-08-19 LAB — T3, FREE: T3, Free: 2 pg/mL (ref 2.0–4.4)

## 2019-08-19 NOTE — Progress Notes (Signed)
[facility] PROGRESS NOTE  [name] [name] [id number] DOB: Apr 04, 1934 DOA: 08/17/2019 PCP: Lajean Manes, MD  [name] [name] is an 86 year old female with past medical history of chronic kidney disease, type 2 diabetes, chronic back pain who was brought in via EMS for altered mental status.  Patient was last known normal at 2 PM.  At 6 PM when daughter came to check on her, patient's house was in disarray, purse outside, vomit in both toilets.  Only new medication is an over-the-counter herbal supplement for neuropathic pain.  Assessment/Plan: Enterocolitis. Max temp 99.6.  No leukocytosis.  No signs of sepsis.  CT abdomen pelvis with findings concerning for infectious or inflammatory enterocolitis.  Lactic acid 1.3.  Blood cultures no growth to date.   -normal BM yesterday -Continue Cipro and Flagyl-convert to oral in the morning -Antiemetic as needed  Questionable allergic reaction causing altered mental status/acute metabolic encephalopathy -Patient has a sulfa allergy -I reviewed the ingredients in her new supplement and appears to have benfotiamine-which has derivatives of sulfa. -Have instructed daughter to discard of this medicine and instructed patient to never start an over-the-counter supplement without PCP permission  Numbness and tingling in hands and feet -In January 2020 patient had a CT myelogram done -Have spoken with daughters to have them follow-up with PCP as she did have some spinal stenosis -Patient states it is much improved this morning  Hyperglycemia in the setting of insulin-dependent diabetes Blood glucose 454 on arrival.  Labs not suggestive of DKA.  Patient received NovoLog 10 units and 1 L fluid bolus.  Blood glucose improved to 391. -Sliding scale insulin moderate ACHS and CBG checks -Resume home insulin but increase dose  AKI on CKD stage II Creatinine trending up this am to 1.4, baseline 0.6-0.9.  Likely prerenal from dehydration and home ARB  use. -Encourage p.o. intake -Recheck BMP in a.m.  Hypothyroidism. TSH elevated at 8.3, however, free T4 grossly normal (1.23). home meds include synthroid -increase synthroid to 159mcg -OP follow up  Hypertension Blood pressure was elevated on arrival no on high end of normal. -Hydralazine as needed for SBP >160 -holding home ARB as noted above    Code Status: full Family Communication: daughter on phone Disposition Plan: home in am if tolerates advancement of diet     Antibiotics:  Flagyl 1/28>>  cipro 1/28>>  HPI/Subjective: Feeling better Tolerating clears  Objective: Vitals:   08/19/19 0455 08/19/19 0927  BP: (!) 146/62 (!) 159/65  Pulse: (!) 58 (!) 58  Resp: 16 18  Temp: 98.1 F (36.7 C) 98 F (36.7 C)  SpO2: 96% 100%    Intake/Output Summary (Last 24 hours) at 08/19/2019 1202 Last data filed at 08/19/2019 1154 Gross per 24 hour  Intake 1542.69 ml  Output 1550 ml  Net -7.31 ml   Filed Weights   08/18/19 0552 08/18/19 2034  Weight: 63.5 kg 65.1 kg    Exam:   In chair, no acute distress  Regular rate and rhythm  No increased work of breathing  Positive bowel sounds soft nontender  Minimal lower extremity edema  Alert, pleasant and cooperative  Data Reviewed: Basic Metabolic Panel: Recent Labs  Lab 08/17/19 2247 08/18/19 0826 08/19/19 0422  NA 131* 131* 134*  K 4.4 4.4 3.6  CL 92* 94* 98  CO2 22 23 28   GLUCOSE 454* 457* 77  BUN 22 21 17   CREATININE 1.29* 1.41* 1.28*  CALCIUM 10.3 9.8 9.1   Liver Function Tests: Recent Labs  Lab 08/17/19  2247  AST 35  ALT 32  ALKPHOS 85  BILITOT 0.7  PROT 8.0  ALBUMIN 4.1   No results for input(s): LIPASE, AMYLASE in the last 168 hours. Recent Labs  Lab 08/18/19 0828  AMMONIA 27   CBC: Recent Labs  Lab 08/17/19 2247  WBC 9.1  NEUTROABS 8.2*  HGB 12.1  HCT 36.5  MCV 96.6  PLT 242   Cardiac Enzymes: No results for input(s): CKTOTAL, CKMB, CKMBINDEX, TROPONINI in the  last 168 hours. BNP (last 3 results) No results for input(s): BNP in the last 8760 hours.  ProBNP (last 3 results) No results for input(s): PROBNP in the last 8760 hours.  CBG: Recent Labs  Lab 08/18/19 1146 08/18/19 1704 08/18/19 2129 08/19/19 0656 08/19/19 1123  GLUCAP 264* 258* 101* 82 149*    Recent Results (from the past 240 hour(s))  Blood culture (routine x 2)     Status: None (Preliminary result)   Collection Time: 08/17/19 12:36 AM   Specimen: BLOOD LEFT HAND  Result Value Ref Range Status   Specimen Description BLOOD LEFT HAND  Final   Special Requests   Final    BOTTLES DRAWN AEROBIC AND ANAEROBIC Blood Culture results may not be optimal due to an inadequate volume of blood received in culture bottles   Culture   Final    NO GROWTH < 12 HOURS Performed at [facility], [place] [address] [place], [place] [address]    Report Status PENDING  Incomplete  Blood culture (routine x 2)     Status: None (Preliminary result)   Collection Time: 08/17/19 12:37 AM   Specimen: BLOOD  Result Value Ref Range Status   Specimen Description BLOOD RIGHT ANTECUBITAL  Final   Special Requests   Final    BOTTLES DRAWN AEROBIC AND ANAEROBIC Blood Culture results may not be optimal due to an inadequate volume of blood received in culture bottles   Culture   Final    NO GROWTH < 12 HOURS Performed at [facility], [place] [address], [place], [place] [address]    Report Status PENDING  Incomplete  Respiratory Panel by RT PCR (Flu A&B, Covid) - Nasopharyngeal Swab     Status: None   Collection Time: 08/17/19 11:15 PM   Specimen: Nasopharyngeal Swab  Result Value Ref Range Status   SARS Coronavirus 2 by RT PCR NEGATIVE NEGATIVE Final    Comment: (NOTE) SARS-CoV-2 target nucleic acids are NOT DETECTED. The SARS-CoV-2 RNA is generally detectable in upper respiratoy specimens during the acute phase of infection. The lowest concentration of SARS-CoV-2 viral copies this assay can  detect is 131 copies/mL. A negative result does not preclude SARS-Cov-2 infection and should not be used as the sole basis for treatment or other patient management decisions. A negative result may occur with  improper specimen collection/handling, submission of specimen other than nasopharyngeal swab, presence of viral mutation(s) within the areas targeted by this assay, and inadequate number of viral copies (<131 copies/mL). A negative result must be combined with clinical observations, patient history, and epidemiological information. The expected result is Negative. Fact Sheet for Patients:  PinkCheek.be Fact Sheet for Healthcare Providers:  GravelBags.it This test is not yet ap proved or cleared by the [place] FDA and  has been authorized for detection and/or diagnosis of SARS-CoV-2 by FDA under an Emergency Use Authorization (EUA). This EUA will remain  in effect (meaning this test can be used) for the duration of the COVID-19  declaration under Section 564(b)(1) of the Act, 21 U.S.C. section 360bbb-3(b)(1), unless the authorization is terminated or revoked sooner.    Influenza A by PCR NEGATIVE NEGATIVE Final   Influenza B by PCR NEGATIVE NEGATIVE Final    Comment: (NOTE) The Xpert Xpress SARS-CoV-2/FLU/RSV assay is intended as an aid in  the diagnosis of influenza from Nasopharyngeal swab specimens and  should not be used as a sole basis for treatment. Nasal washings and  aspirates are unacceptable for Xpert Xpress SARS-CoV-2/FLU/RSV  testing. Fact Sheet for Patients: PinkCheek.be Fact Sheet for Healthcare Providers: GravelBags.it This test is not yet approved or cleared by the [place] FDA and  has been authorized for detection and/or diagnosis of SARS-CoV-2 by  FDA under an Emergency Use Authorization (EUA). This EUA will remain  in effect (meaning  this test can be used) for the duration of the  Covid-19 declaration under Section 564(b)(1) of the Act, 21  U.S.C. section 360bbb-3(b)(1), unless the authorization is  terminated or revoked. Performed at [facility], [place] [address] [place], [place] [address]   Urine culture     Status: None   Collection Time: 08/17/19 11:20 PM   Specimen: Urine, Clean Catch  Result Value Ref Range Status   Specimen Description URINE, CLEAN CATCH  Final   Special Requests NONE  Final   Culture   Final    NO GROWTH Performed at [facility], [place] [address], [place], [place] [address]    Report Status 08/18/2019 FINAL  Final     Studies: CT Head Wo Contrast  Result Date: 08/18/2019 CLINICAL DATA:  Altered mental status EXAM: CT HEAD WITHOUT CONTRAST TECHNIQUE: Contiguous axial images were obtained from the base of the skull through the vertex without intravenous contrast. COMPARISON:  12/21/2016 FINDINGS: Brain: Stable chronic periventricular small vessel disease. No acute intracranial abnormality. Specifically, no hemorrhage, hydrocephalus, mass lesion, acute infarction, or significant intracranial injury. Vascular: No hyperdense vessel or unexpected calcification. Skull: No acute calvarial abnormality. Sinuses/Orbits: Visualized paranasal sinuses and mastoids clear. Orbital soft tissues unremarkable. Other: None IMPRESSION: Chronic small vessel disease.  No acute intracranial abnormality. Electronically Signed   By: Rolm Baptise M.D.   On: 08/18/2019 00:55   CT ABDOMEN PELVIS W CONTRAST  Result Date: 08/18/2019 CLINICAL DATA:  Bowel obstruction suspected EXAM: CT ABDOMEN AND PELVIS WITH CONTRAST TECHNIQUE: Multidetector CT imaging of the abdomen and pelvis was performed using the standard protocol following bolus administration of intravenous contrast. CONTRAST:  144mL OMNIPAQUE IOHEXOL 300 MG/ML  SOLN COMPARISON:  CT abdomen pelvis Dec 01, 2016 FINDINGS: Lower chest: Lung bases are clear.  Normal heart size. No pericardial effusion. Coronary artery calcifications are noted. Hepatobiliary: Diffuse hepatic hypoattenuation compatible with hepatic steatosis. No focal liver abnormality is seen. Gallbladder is decompressed. Radiodensity at the gallbladder fossa is in similar position to prior though difficult to fully visualize given extensive respiratory motion artifact in the upper abdomen. No gross biliary ductal dilatation is seen. Pancreas: Unremarkable. No pancreatic ductal dilatation or surrounding inflammatory changes. Spleen: Normal in size without focal abnormality. Adrenals/Urinary Tract: Normal adrenal glands. 2.3 cm hypoattenuating cyst seen in the lower pole left kidney. No concerning renal lesions. No visible urolithiasis or hydronephrosis. Circumferential bladder wall thickening with some adjacent perivesicular haze. Stomach/Bowel: Distal esophagus, stomach and duodenum are unremarkable. Much of the small bowel is decompressed. The bowel is difficult to fully assess given extensive motion artifact and a paucity of intraperitoneal fat though question several loops of  small bowel in the mid abdomen which appear slightly thickened for example coronal series 6 image 63. no evidence of bowel obstruction is seen however. The appendix is not well visualized. No focal pericecal inflammation to suggest an occult appendicitis. Moderate volume of stool throughout the colon. Mild pericolonic edematous changes are noted diffusely. Vascular/Lymphatic: Extensive atherosclerotic plaque is seen throughout the aorta and branch vessels. Question near complete occlusion of the proximal left common iliac artery. No aneurysm or ectasia. Reproductive: Uterus is surgically absent. No concerning adnexal lesions. Other: Increased attenuation of mesenteric, may be reactive stranding or related to hydration status. No free air or free fluid is seen. No bowel containing hernias. Postsurgical changes of the low anterior  abdominal wall. Musculoskeletal: Multilevel degenerative changes are present in the imaged portions of the spine. No acute osseous abnormality or suspicious osseous lesion. The osseous structures appear diffusely demineralized which may limit detection of small or nondisplaced fractures. IMPRESSION: 1. Much of the small bowel is decompressed though question several loops of small bowel in the mid abdomen appear slightly thickened. Additional faint pericolonic hazy stranding is noted as well. Findings could reflect an infectious or inflammatory enterocolitis. No evidence of bowel obstruction is seen however. 2. Circumferential bladder wall thickening with some adjacent perivesicular haze. Correlate with urinalysis to exclude underlying cystitis. 3. Hepatic steatosis. 4. Extensive atherosclerotic plaque throughout the aorta and branch vessels. Likely near complete occlusion of the proximal left common iliac artery. Electronically Signed   By: Lovena Le M.D.   On: 08/18/2019 03:14   DG Chest Portable 1 View  Result Date: 08/17/2019 CLINICAL DATA:  Fever and altered mental status. EXAM: PORTABLE CHEST 1 VIEW COMPARISON:  December 21, 2016 FINDINGS: There is no evidence of acute infiltrate, pleural effusion or pneumothorax. The heart size and mediastinal contours are within normal limits. There is moderate severity calcification of the aortic arch. The visualized skeletal structures are unremarkable. IMPRESSION: No active disease. Electronically Signed   By: Virgina Norfolk M.D.   On: 08/17/2019 23:44    Scheduled Meds: . enoxaparin (LOVENOX) injection  30 mg Subcutaneous Q24H  . insulin aspart  0-15 Units Subcutaneous TID WC  . insulin aspart  0-5 Units Subcutaneous QHS  . insulin aspart  3 Units Subcutaneous TID WC  . insulin detemir  8 Units Subcutaneous BID  . levothyroxine  137 mcg Oral Q0600  . pantoprazole  40 mg Oral Daily   Continuous Infusions: . sodium chloride 1,000 mL (08/18/19 2238)  .  ciprofloxacin 400 mg (08/19/19 1132)  . metronidazole 500 mg (08/19/19 [id number])    Principal Problem:   Enterocolitis Active Problems:   Hypothyroidism, acquired   Type 2 diabetes mellitus without complication, with long-term current use of insulin ([place])   Essential hypertension   AKI (acute kidney injury) ([place])   Acute encephalopathy   Hyperglycemia   Chronic kidney disease    Time spent: 25 minutes    [facility]  If 7PM-7AM, please contact night-coverage at [email or web address] 08/19/2019, 12:02 PM  LOS: 1 day

## 2019-08-20 ENCOUNTER — Other Ambulatory Visit: Payer: Self-pay

## 2019-08-20 LAB — BASIC METABOLIC PANEL
Anion gap: 11 (ref 5–15)
BUN: 14 mg/dL (ref 8–23)
CO2: 25 mmol/L (ref 22–32)
Calcium: 9.5 mg/dL (ref 8.9–10.3)
Chloride: 100 mmol/L (ref 98–111)
Creatinine, Ser: 1.3 mg/dL — ABNORMAL HIGH (ref 0.44–1.00)
GFR calc Af Amer: 44 mL/min — ABNORMAL LOW (ref >60)
GFR calc non Af Amer: 38 mL/min — ABNORMAL LOW (ref >60)
Glucose, Bld: 116 mg/dL — ABNORMAL HIGH (ref 70–99)
Potassium: 4 mmol/L (ref 3.5–5.1)
Sodium: 136 mmol/L (ref 135–145)

## 2019-08-20 LAB — GLUCOSE, CAPILLARY
Glucose-Capillary: 117 mg/dL — ABNORMAL HIGH (ref 70–99)
Glucose-Capillary: 229 mg/dL — ABNORMAL HIGH (ref 70–99)

## 2019-08-20 MED ORDER — METRONIDAZOLE 500 MG PO TABS: 500.0000 mg | ORAL_TABLET | Freq: Three times a day (TID) | ORAL | 0 refills | Status: DC

## 2019-08-20 MED ORDER — CIPROFLOXACIN HCL 250 MG PO TABS: 250.0000 mg | ORAL_TABLET | Freq: Two times a day (BID) | ORAL | 0 refills | Status: DC

## 2019-08-20 MED ORDER — CIPROFLOXACIN HCL 250 MG PO TABS
250.0000 mg | ORAL_TABLET | Freq: Two times a day (BID) | ORAL | Status: DC
  Administered 2019-08-20: 250 mg via ORAL
  Filled 2019-08-20: qty 1

## 2019-08-20 MED ORDER — METRONIDAZOLE 500 MG PO TABS
500.0000 mg | ORAL_TABLET | Freq: Three times a day (TID) | ORAL | Status: DC
  Administered 2019-08-20: 500 mg via ORAL
  Filled 2019-08-20: qty 1

## 2019-08-20 NOTE — Progress Notes (Signed)
DISCHARGE NOTE HOME [name] [name] to be discharged Home per MD order. Discussed prescriptions and follow up appointments with the patient. Prescriptions given to patient; medication list explained in detail. Patient verbalized understanding.  Skin clean, dry and intact without evidence of skin break down, no evidence of skin tears noted. IV catheter discontinued intact. Site without signs and symptoms of complications. Dressing and pressure applied. Pt denies pain at the site currently. No complaints noted.  Patient free of lines, drains, and wounds.   An After Visit Summary (AVS) was printed and given to the patient. Patient escorted via wheelchair, and discharged home via private auto.  Khushi Zupko, Beryle Lathe, RN

## 2019-08-20 NOTE — Discharge Summary (Signed)
Physician Discharge Summary  [name] [name] [id number] DOB: 04-Aug-1933 DOA: 08/17/2019  PCP: [name] Manes, MD  Admit date: 08/17/2019 Discharge date: 08/20/2019  Admitted From: Home Discharge disposition: Home   Recommendations for Outpatient Follow-Up:   1. Patient to clear all herbal medications prior to starting with PCP 2. Recheck TSH   Discharge Diagnosis:   Principal Problem:   Enterocolitis Active Problems:   Hypothyroidism, acquired   Type 2 diabetes mellitus without complication, with long-term current use of insulin (HCC)   Essential hypertension   AKI (acute kidney injury) ([place])   Acute encephalopathy   Hyperglycemia   Chronic kidney disease    Discharge Condition: Improved.  Diet recommendation: Low sodium, heart healthy.  Carbohydrate-modified.    Wound care: None.  Code status: Full.   History of Present Illness:   [name] [name] is an 86 year old female with past medical history of chronic kidney disease, type 2 diabetes, chronic back pain who was brought in via EMS for altered mental status.  Patient was last known normal at 2 PM.  At 6 PM when daughter came to check on her, patient's house was in disarray, purse outside, vomit in both toilets.  Only new medication is an over-the-counter herbal supplement for neuropathic pain.    Hospital Course by Problem:   Enterocolitis. Max temp 99.6. No leukocytosis. No signs of sepsis. CT abdomen pelvis with findings concerning for infectious or inflammatory enterocolitis.  Lactic acid 1.3.  Blood cultures no growth to date.   -normal BM yesterday -Short course of antibiotics  Questionable allergic reaction causing altered mental status/acute metabolic encephalopathy -Patient has a sulfa allergy -I reviewed the ingredients in her new supplement and appears to have benfotiamine-which has derivatives of sulfa. -Have instructed daughter to discard of this medicine and instructed patient to never  start an over-the-counter supplement without PCP permission  Numbness and tingling in hands and feet -In January 2020 patient had a CT myelogram done -Have spoken with daughters to have them follow-up with PCP as she did have some spinal stenosis  Hyperglycemia in the setting of insulin-dependent diabetes Resume home medications  AKI on CKD stage II -Improved with IV fluids  Hypothyroidism. TSH elevated at 8.3, however, free T4 grossly normal (1.23). home meds include synthroid -OP follow up  Hypertension Resume home medicine    Medical Consultants:      Discharge Exam:   Vitals:   08/20/19 0516 08/20/19 0900  BP: (!) 164/73 (!) 142/68  Pulse: 75 72  Resp: 16 18  Temp: 98.7 F (37.1 C) 98.2 F (36.8 C)  SpO2: 100% 100%   Vitals:   08/19/19 1650 08/19/19 2021 08/20/19 0516 08/20/19 0900  BP: (!) 142/67 (!) 141/53 (!) 164/73 (!) 142/68  Pulse: 62 73 75 72  Resp: 18 18 16 18   Temp: 98.2 F (36.8 C) 98.6 F (37 C) 98.7 F (37.1 C) 98.2 F (36.8 C)  TempSrc: Oral Oral Oral Oral  SpO2: 100% 98% 100% 100%  Weight:        General exam: Appears calm and comfortable  The results of significant diagnostics from this hospitalization (including imaging, microbiology, ancillary and laboratory) are listed below for reference.     Procedures and Diagnostic Studies:   CT Head Wo Contrast  Result Date: 08/18/2019 CLINICAL DATA:  Altered mental status EXAM: CT HEAD WITHOUT CONTRAST TECHNIQUE: Contiguous axial images were obtained from the base of the skull through the vertex without intravenous contrast. COMPARISON:  12/21/2016  FINDINGS: Brain: Stable chronic periventricular small vessel disease. No acute intracranial abnormality. Specifically, no hemorrhage, hydrocephalus, mass lesion, acute infarction, or significant intracranial injury. Vascular: No hyperdense vessel or unexpected calcification. Skull: No acute calvarial abnormality. Sinuses/Orbits: Visualized  paranasal sinuses and mastoids clear. Orbital soft tissues unremarkable. Other: None IMPRESSION: Chronic small vessel disease.  No acute intracranial abnormality. Electronically Signed   By: [name] [name] M.D.   On: 08/18/2019 00:55   CT ABDOMEN PELVIS W CONTRAST  Result Date: 08/18/2019 CLINICAL DATA:  Bowel obstruction suspected EXAM: CT ABDOMEN AND PELVIS WITH CONTRAST TECHNIQUE: Multidetector CT imaging of the abdomen and pelvis was performed using the standard protocol following bolus administration of intravenous contrast. CONTRAST:  165mL OMNIPAQUE IOHEXOL 300 MG/ML  SOLN COMPARISON:  CT abdomen pelvis Dec 01, 2016 FINDINGS: Lower chest: Lung bases are clear. Normal heart size. No pericardial effusion. Coronary artery calcifications are noted. Hepatobiliary: Diffuse hepatic hypoattenuation compatible with hepatic steatosis. No focal liver abnormality is seen. Gallbladder is decompressed. Radiodensity at the gallbladder fossa is in similar position to prior though difficult to fully visualize given extensive respiratory motion artifact in the upper abdomen. No gross biliary ductal dilatation is seen. Pancreas: Unremarkable. No pancreatic ductal dilatation or surrounding inflammatory changes. Spleen: Normal in size without focal abnormality. Adrenals/Urinary Tract: Normal adrenal glands. 2.3 cm hypoattenuating cyst seen in the lower pole left kidney. No concerning renal lesions. No visible urolithiasis or hydronephrosis. Circumferential bladder wall thickening with some adjacent perivesicular haze. Stomach/Bowel: Distal esophagus, stomach and duodenum are unremarkable. Much of the small bowel is decompressed. The bowel is difficult to fully assess given extensive motion artifact and a paucity of intraperitoneal fat though question several loops of small bowel in the mid abdomen which appear slightly thickened for example coronal series 6 image 63. no evidence of bowel obstruction is seen however. The  appendix is not well visualized. No focal pericecal inflammation to suggest an occult appendicitis. Moderate volume of stool throughout the colon. Mild pericolonic edematous changes are noted diffusely. Vascular/Lymphatic: Extensive atherosclerotic plaque is seen throughout the aorta and branch vessels. Question near complete occlusion of the proximal left common iliac artery. No aneurysm or ectasia. Reproductive: Uterus is surgically absent. No concerning adnexal lesions. Other: Increased attenuation of mesenteric, may be reactive stranding or related to hydration status. No free air or free fluid is seen. No bowel containing hernias. Postsurgical changes of the low anterior abdominal wall. Musculoskeletal: Multilevel degenerative changes are present in the imaged portions of the spine. No acute osseous abnormality or suspicious osseous lesion. The osseous structures appear diffusely demineralized which may limit detection of small or nondisplaced fractures. IMPRESSION: 1. Much of the small bowel is decompressed though question several loops of small bowel in the mid abdomen appear slightly thickened. Additional faint pericolonic hazy stranding is noted as well. Findings could reflect an infectious or inflammatory enterocolitis. No evidence of bowel obstruction is seen however. 2. Circumferential bladder wall thickening with some adjacent perivesicular haze. Correlate with urinalysis to exclude underlying cystitis. 3. Hepatic steatosis. 4. Extensive atherosclerotic plaque throughout the aorta and branch vessels. Likely near complete occlusion of the proximal left common iliac artery. Electronically Signed   By: Lovena Le M.D.   On: 08/18/2019 03:14   DG Chest Portable 1 View  Result Date: 08/17/2019 CLINICAL DATA:  Fever and altered mental status. EXAM: PORTABLE CHEST 1 VIEW COMPARISON:  December 21, 2016 FINDINGS: There is no evidence of acute infiltrate, pleural effusion or pneumothorax. The heart size and  mediastinal contours are within normal limits. There is moderate severity calcification of the aortic arch. The visualized skeletal structures are unremarkable. IMPRESSION: No active disease. Electronically Signed   By: Virgina Norfolk M.D.   On: 08/17/2019 23:44     Labs:   Basic Metabolic Panel: Recent Labs  Lab 08/17/19 2247 08/17/19 2247 08/18/19 0826 08/18/19 0826 08/19/19 0422 08/20/19 0659  NA 131*  --  131*  --  134* 136  K 4.4   < > 4.4   < > 3.6 4.0  CL 92*  --  94*  --  98 100  CO2 22  --  23  --  28 25  GLUCOSE 454*  --  457*  --  77 116*  BUN 22  --  21  --  17 14  CREATININE 1.29*  --  1.41*  --  1.28* 1.30*  CALCIUM 10.3  --  9.8  --  9.1 9.5   < > = values in this interval not displayed.   GFR Estimated Creatinine Clearance: 30.1 mL/min (A) (by C-G formula based on SCr of 1.3 mg/dL (H)). Liver Function Tests: Recent Labs  Lab 08/17/19 2247  AST 35  ALT 32  ALKPHOS 85  BILITOT 0.7  PROT 8.0  ALBUMIN 4.1   No results for input(s): LIPASE, AMYLASE in the last 168 hours. Recent Labs  Lab 08/18/19 0828  AMMONIA 27   Coagulation profile Recent Labs  Lab 08/17/19 2314  INR 0.9    CBC: Recent Labs  Lab 08/17/19 2247  WBC 9.1  NEUTROABS 8.2*  HGB 12.1  HCT 36.5  MCV 96.6  PLT 242   Cardiac Enzymes: No results for input(s): CKTOTAL, CKMB, CKMBINDEX, TROPONINI in the last 168 hours. BNP: Invalid input(s): POCBNP CBG: Recent Labs  Lab 08/19/19 1123 08/19/19 1626 08/19/19 1914 08/19/19 2223 08/20/19 0716  GLUCAP 149* 502* 340* 165* 117*   D-Dimer No results for input(s): DDIMER in the last 72 hours. Hgb A1c Recent Labs    08/18/19 0828  HGBA1C 8.6*   Lipid Profile No results for input(s): CHOL, HDL, LDLCALC, TRIG, CHOLHDL, LDLDIRECT in the last 72 hours. Thyroid function studies Recent Labs    08/17/19 2322  TSH 8.334*  T3FREE 2.0   Anemia work up Recent Labs    08/18/19 Point*    Microbiology Recent Results (from the past 240 hour(s))  Blood culture (routine x 2)     Status: None (Preliminary result)   Collection Time: 08/17/19 12:36 AM   Specimen: BLOOD LEFT HAND  Result Value Ref Range Status   Specimen Description BLOOD LEFT HAND  Final   Special Requests   Final    BOTTLES DRAWN AEROBIC AND ANAEROBIC Blood Culture results may not be optimal due to an inadequate volume of blood received in culture bottles   Culture   Final    NO GROWTH 1 DAY Performed at [facility], [place] [address], [place], [place] [address]    Report Status PENDING  Incomplete  Blood culture (routine x 2)     Status: None (Preliminary result)   Collection Time: 08/17/19 12:37 AM   Specimen: BLOOD  Result Value Ref Range Status   Specimen Description BLOOD RIGHT ANTECUBITAL  Final   Special Requests   Final    BOTTLES DRAWN AEROBIC AND ANAEROBIC Blood Culture results may not be optimal due to an inadequate volume of blood received in culture bottles   Culture   Final  NO GROWTH 1 DAY Performed at [facility], [place] [address] [place], [place] [address]    Report Status PENDING  Incomplete  Respiratory Panel by RT PCR (Flu A&B, Covid) - Nasopharyngeal Swab     Status: None   Collection Time: 08/17/19 11:15 PM   Specimen: Nasopharyngeal Swab  Result Value Ref Range Status   SARS Coronavirus 2 by RT PCR NEGATIVE NEGATIVE Final    Comment: (NOTE) SARS-CoV-2 target nucleic acids are NOT DETECTED. The SARS-CoV-2 RNA is generally detectable in upper respiratoy specimens during the acute phase of infection. The lowest concentration of SARS-CoV-2 viral copies this assay can detect is 131 copies/mL. A negative result does not preclude SARS-Cov-2 infection and should not be used as the sole basis for treatment or other patient management decisions. A negative result may occur with  improper specimen collection/handling, submission of specimen other than nasopharyngeal swab,  presence of viral mutation(s) within the areas targeted by this assay, and inadequate number of viral copies (<131 copies/mL). A negative result must be combined with clinical observations, patient history, and epidemiological information. The expected result is Negative. Fact Sheet for Patients:  PinkCheek.be Fact Sheet for Healthcare Providers:  GravelBags.it This test is not yet ap proved or cleared by the [place] FDA and  has been authorized for detection and/or diagnosis of SARS-CoV-2 by FDA under an Emergency Use Authorization (EUA). This EUA will remain  in effect (meaning this test can be used) for the duration of the COVID-19 declaration under Section 564(b)(1) of the Act, 21 U.S.C. section 360bbb-3(b)(1), unless the authorization is terminated or revoked sooner.    Influenza A by PCR NEGATIVE NEGATIVE Final   Influenza B by PCR NEGATIVE NEGATIVE Final    Comment: (NOTE) The Xpert Xpress SARS-CoV-2/FLU/RSV assay is intended as an aid in  the diagnosis of influenza from Nasopharyngeal swab specimens and  should not be used as a sole basis for treatment. Nasal washings and  aspirates are unacceptable for Xpert Xpress SARS-CoV-2/FLU/RSV  testing. Fact Sheet for Patients: PinkCheek.be Fact Sheet for Healthcare Providers: GravelBags.it This test is not yet approved or cleared by the [place] FDA and  has been authorized for detection and/or diagnosis of SARS-CoV-2 by  FDA under an Emergency Use Authorization (EUA). This EUA will remain  in effect (meaning this test can be used) for the duration of the  Covid-19 declaration under Section 564(b)(1) of the Act, 21  U.S.C. section 360bbb-3(b)(1), unless the authorization is  terminated or revoked. Performed at [facility], [place] [address], [place], [place] [address]   Urine culture     Status: None    Collection Time: 08/17/19 11:20 PM   Specimen: Urine, Clean Catch  Result Value Ref Range Status   Specimen Description URINE, CLEAN CATCH  Final   Special Requests NONE  Final   Culture   Final    NO GROWTH Performed at [facility], [place] [address] [place], [place] [address]    Report Status 08/18/2019 FINAL  Final     Discharge Instructions:   Discharge Instructions    Diet - low sodium heart healthy   Complete by: As directed    Diet Carb Modified   Complete by: As directed    Discharge instructions   Complete by: As directed    DO NOT TAKE ANYTHING OTC/HERBAL SUPPLEMENTS WITH OUT FIRST SPEAKING TO YOUR PCP   Increase activity slowly   Complete by: As directed  Allergies as of 08/20/2019      Reactions   Sulfonamide Derivatives Hives   Bactrim [sulfamethoxazole-trimethoprim] Rash   Reported by [facility]   Lisinopril Cough   Reported by [facility]      Medication List    STOP taking these medications   estradiol 0.1 MG/GM vaginal cream Commonly known as: ESTRACE   olopatadine 0.1 % ophthalmic solution Commonly known as: PATANOL     TAKE these medications   acetaminophen 500 MG tablet Commonly known as: TYLENOL Take 500 mg by mouth every 6 (six) hours as needed for mild pain.   alendronate 70 MG tablet Commonly known as: FOSAMAX TAKE 1 TABLET BY MOUTH EVERY 7 DAYS ON AN EMPTY STOMACH WITH A FULL GLASS OF WATER What changed: See the new instructions.   aspirin EC 81 MG tablet Take 81 mg by mouth daily.   atorvastatin 40 MG tablet Commonly known as: LIPITOR Take 40 mg by mouth daily at 6 PM.   B-D UF III MINI PEN NEEDLES 31G X 5 MM Misc Generic drug: Insulin Pen Needle USE AS DIRECTED TID   calcium-vitamin D 250-100 MG-UNIT tablet Take 1 tablet by mouth daily.   cholecalciferol 1000 units tablet Commonly known as: VITAMIN D Take 1,000 Units by mouth daily.   ciprofloxacin 250 MG tablet Commonly known as: CIPRO Take 1  tablet (250 mg total) by mouth 2 (two) times daily.   Fish Oil 1000 MG Caps Take 1,000 mg by mouth 2 (two) times daily.   Fluad 0.5 ML Susy Generic drug: Influenza Vac A&B Surf Ant Adj ADM 0.5ML IM UTD   Fusion Plus Caps Take 1 capsule by mouth daily.   Levemir FlexTouch 100 UNIT/ML Pen Generic drug: Insulin Detemir Inject 7 Units into the skin 2 (two) times daily.   levothyroxine 100 MCG tablet Commonly known as: SYNTHROID Take 100 mcg by mouth daily before breakfast.   losartan 50 MG tablet Commonly known as: COZAAR Take 50 mg by mouth daily.   metroNIDAZOLE 500 MG tablet Commonly known as: FLAGYL Take 1 tablet (500 mg total) by mouth every 8 (eight) hours.   mometasone 50 MCG/ACT nasal spray Commonly known as: NASONEX Place 2 sprays into the nose daily as needed (nasal congestion).   multivitamin with minerals Tabs tablet Take 1 tablet by mouth daily.   NovoLOG 100 UNIT/ML injection Generic drug: insulin aspart Inject 6-8 Units into the skin 3 (three) times daily before meals. SLIDING SCALE: 70-199= 6 UNITS 200-299= 7 UNITS >300= 8 UNITS   pantoprazole 40 MG tablet Commonly known as: PROTONIX Take 40 mg by mouth daily.      Follow-up Information    Stoneking, Hal, MD Follow up in 1 week(s).   Specialty: Internal Medicine Contact information: [address] [place] [address] [phone]            Time coordinating discharge: 35 minutes  Signed:  Geradine Girt DO  [facility] 08/20/2019, 10:51 AM

## 2019-08-21 ENCOUNTER — Observation Stay (HOSPITAL_COMMUNITY): Payer: Medicare HMO

## 2019-08-21 ENCOUNTER — Emergency Department (HOSPITAL_COMMUNITY): Payer: Medicare HMO

## 2019-08-21 ENCOUNTER — Other Ambulatory Visit: Payer: Self-pay

## 2019-08-21 ENCOUNTER — Observation Stay (HOSPITAL_COMMUNITY)
Admission: EM | Admit: 2019-08-21 | Discharge: 2019-08-23 | Disposition: A | Discharge status: Home / Self Care | Payer: Medicare HMO | Attending: Family Medicine | Admitting: Family Medicine

## 2019-08-21 DIAGNOSIS — E039 Hypothyroidism, unspecified: Secondary | ICD-10-CM | POA: Insufficient documentation

## 2019-08-21 DIAGNOSIS — Z79890 Hormone replacement therapy (postmenopausal): Secondary | ICD-10-CM | POA: Diagnosis not present

## 2019-08-21 DIAGNOSIS — Z881 Allergy status to other antibiotic agents status: Secondary | ICD-10-CM | POA: Diagnosis not present

## 2019-08-21 DIAGNOSIS — R4182 Altered mental status, unspecified: Secondary | ICD-10-CM

## 2019-08-21 DIAGNOSIS — E1122 Type 2 diabetes mellitus with diabetic chronic kidney disease: Secondary | ICD-10-CM | POA: Diagnosis not present

## 2019-08-21 DIAGNOSIS — Z7982 Long term (current) use of aspirin: Secondary | ICD-10-CM | POA: Insufficient documentation

## 2019-08-21 DIAGNOSIS — Z882 Allergy status to sulfonamides status: Secondary | ICD-10-CM | POA: Insufficient documentation

## 2019-08-21 DIAGNOSIS — G8929 Other chronic pain: Secondary | ICD-10-CM | POA: Diagnosis not present

## 2019-08-21 DIAGNOSIS — M79601 Pain in right arm: Secondary | ICD-10-CM | POA: Diagnosis not present

## 2019-08-21 DIAGNOSIS — Z03818 Encounter for observation for suspected exposure to other biological agents ruled out: Secondary | ICD-10-CM | POA: Diagnosis not present

## 2019-08-21 DIAGNOSIS — F0390 Unspecified dementia without behavioral disturbance: Secondary | ICD-10-CM | POA: Diagnosis not present

## 2019-08-21 DIAGNOSIS — M170 Bilateral primary osteoarthritis of knee: Secondary | ICD-10-CM | POA: Insufficient documentation

## 2019-08-21 DIAGNOSIS — I129 Hypertensive chronic kidney disease with stage 1 through stage 4 chronic kidney disease, or unspecified chronic kidney disease: Secondary | ICD-10-CM | POA: Diagnosis not present

## 2019-08-21 DIAGNOSIS — E119 Type 2 diabetes mellitus without complications: Secondary | ICD-10-CM

## 2019-08-21 DIAGNOSIS — Z888 Allergy status to other drugs, medicaments and biological substances status: Secondary | ICD-10-CM | POA: Diagnosis not present

## 2019-08-21 DIAGNOSIS — N182 Chronic kidney disease, stage 2 (mild): Secondary | ICD-10-CM | POA: Diagnosis present

## 2019-08-21 DIAGNOSIS — K529 Noninfective gastroenteritis and colitis, unspecified: Secondary | ICD-10-CM | POA: Insufficient documentation

## 2019-08-21 DIAGNOSIS — I69328 Other speech and language deficits following cerebral infarction: Secondary | ICD-10-CM | POA: Insufficient documentation

## 2019-08-21 DIAGNOSIS — G459 Transient cerebral ischemic attack, unspecified: Secondary | ICD-10-CM | POA: Diagnosis not present

## 2019-08-21 DIAGNOSIS — K219 Gastro-esophageal reflux disease without esophagitis: Secondary | ICD-10-CM | POA: Diagnosis not present

## 2019-08-21 DIAGNOSIS — E7800 Pure hypercholesterolemia, unspecified: Secondary | ICD-10-CM | POA: Diagnosis not present

## 2019-08-21 DIAGNOSIS — D72829 Elevated white blood cell count, unspecified: Secondary | ICD-10-CM | POA: Insufficient documentation

## 2019-08-21 DIAGNOSIS — Z20822 Contact with and (suspected) exposure to covid-19: Secondary | ICD-10-CM | POA: Diagnosis not present

## 2019-08-21 DIAGNOSIS — R55 Syncope and collapse: Secondary | ICD-10-CM | POA: Diagnosis not present

## 2019-08-21 DIAGNOSIS — I639 Cerebral infarction, unspecified: Secondary | ICD-10-CM | POA: Insufficient documentation

## 2019-08-21 DIAGNOSIS — N179 Acute kidney failure, unspecified: Secondary | ICD-10-CM | POA: Insufficient documentation

## 2019-08-21 DIAGNOSIS — R404 Transient alteration of awareness: Secondary | ICD-10-CM | POA: Diagnosis not present

## 2019-08-21 DIAGNOSIS — G9341 Metabolic encephalopathy: Secondary | ICD-10-CM | POA: Diagnosis present

## 2019-08-21 DIAGNOSIS — N189 Chronic kidney disease, unspecified: Secondary | ICD-10-CM

## 2019-08-21 DIAGNOSIS — D649 Anemia, unspecified: Secondary | ICD-10-CM | POA: Diagnosis not present

## 2019-08-21 DIAGNOSIS — Z794 Long term (current) use of insulin: Secondary | ICD-10-CM | POA: Insufficient documentation

## 2019-08-21 DIAGNOSIS — M25511 Pain in right shoulder: Secondary | ICD-10-CM | POA: Insufficient documentation

## 2019-08-21 DIAGNOSIS — M25519 Pain in unspecified shoulder: Secondary | ICD-10-CM | POA: Diagnosis not present

## 2019-08-21 DIAGNOSIS — E785 Hyperlipidemia, unspecified: Secondary | ICD-10-CM | POA: Diagnosis not present

## 2019-08-21 DIAGNOSIS — R531 Weakness: Secondary | ICD-10-CM | POA: Diagnosis not present

## 2019-08-21 DIAGNOSIS — R4781 Slurred speech: Secondary | ICD-10-CM | POA: Diagnosis not present

## 2019-08-21 DIAGNOSIS — Z79899 Other long term (current) drug therapy: Secondary | ICD-10-CM | POA: Insufficient documentation

## 2019-08-21 LAB — URINALYSIS, ROUTINE W REFLEX MICROSCOPIC
Bacteria, UA: NONE SEEN
Bilirubin Urine: NEGATIVE
Glucose, UA: 150 mg/dL — AB
Hgb urine dipstick: NEGATIVE
Ketones, ur: 5 mg/dL — AB
Leukocytes,Ua: NEGATIVE
Nitrite: NEGATIVE
Protein, ur: 30 mg/dL — AB
Specific Gravity, Urine: 1.013 (ref 1.005–1.030)
pH: 6 (ref 5.0–8.0)

## 2019-08-21 LAB — GLUCOSE, CAPILLARY
Glucose-Capillary: 398 mg/dL — ABNORMAL HIGH (ref 70–99)
Glucose-Capillary: 338 mg/dL — ABNORMAL HIGH (ref 70–99)

## 2019-08-21 LAB — COMPREHENSIVE METABOLIC PANEL
ALT: 25 U/L (ref 0–44)
AST: 32 U/L (ref 15–41)
Albumin: 3.1 g/dL — ABNORMAL LOW (ref 3.5–5.0)
Alkaline Phosphatase: 71 U/L (ref 38–126)
Anion gap: 12 (ref 5–15)
BUN: 21 mg/dL (ref 8–23)
CO2: 25 mmol/L (ref 22–32)
Calcium: 9.4 mg/dL (ref 8.9–10.3)
Chloride: 98 mmol/L (ref 98–111)
Creatinine, Ser: 1.63 mg/dL — ABNORMAL HIGH (ref 0.44–1.00)
GFR calc Af Amer: 33 mL/min — ABNORMAL LOW (ref >60)
GFR calc non Af Amer: 29 mL/min — ABNORMAL LOW (ref >60)
Glucose, Bld: 212 mg/dL — ABNORMAL HIGH (ref 70–99)
Potassium: 4 mmol/L (ref 3.5–5.1)
Sodium: 135 mmol/L (ref 135–145)
Total Bilirubin: 0.7 mg/dL (ref 0.3–1.2)
Total Protein: 6.5 g/dL (ref 6.5–8.1)

## 2019-08-21 LAB — CBC
HCT: 31.5 % — ABNORMAL LOW (ref 36.0–46.0)
Hemoglobin: 10.4 g/dL — ABNORMAL LOW (ref 12.0–15.0)
MCH: 32.1 pg (ref 26.0–34.0)
MCHC: 33 g/dL (ref 30.0–36.0)
MCV: 97.2 fL (ref 80.0–100.0)
Platelets: 230 10*3/uL (ref 150–400)
RBC: 3.24 MIL/uL — ABNORMAL LOW (ref 3.87–5.11)
RDW: 12.5 % (ref 11.5–15.5)
WBC: 11.3 10*3/uL — ABNORMAL HIGH (ref 4.0–10.5)
nRBC: 0 % (ref 0.0–0.2)

## 2019-08-21 LAB — RAPID URINE DRUG SCREEN, HOSP PERFORMED
Amphetamines: NOT DETECTED
Barbiturates: NOT DETECTED
Benzodiazepines: NOT DETECTED
Cocaine: NOT DETECTED
Opiates: NOT DETECTED
Tetrahydrocannabinol: NOT DETECTED

## 2019-08-21 LAB — CBG MONITORING, ED
Glucose-Capillary: 185 mg/dL — ABNORMAL HIGH (ref 70–99)
Glucose-Capillary: 323 mg/dL — ABNORMAL HIGH (ref 70–99)
Glucose-Capillary: 366 mg/dL — ABNORMAL HIGH (ref 70–99)

## 2019-08-21 LAB — SARS CORONAVIRUS 2 (TAT 6-24 HRS): SARS Coronavirus 2: NEGATIVE

## 2019-08-21 LAB — PROTIME-INR
INR: 1.1 (ref 0.8–1.2)
Prothrombin Time: 13.7 seconds (ref 11.4–15.2)

## 2019-08-21 LAB — ETHANOL: Alcohol, Ethyl (B): <10 mg/dL (ref <10)

## 2019-08-21 MED ORDER — SODIUM CHLORIDE 0.9 % IV SOLN: INTRAVENOUS | Status: DC

## 2019-08-21 MED ORDER — INSULIN ASPART 100 UNIT/ML ~~LOC~~ SOLN
0.0000 [IU] | Freq: Every day | SUBCUTANEOUS | Status: DC
  Administered 2019-08-21: 4 [IU] via SUBCUTANEOUS
  Administered 2019-08-22: 3 [IU] via SUBCUTANEOUS

## 2019-08-21 MED ORDER — ASPIRIN EC 325 MG PO TBEC: 325.0000 mg | DELAYED_RELEASE_TABLET | Freq: Every day | ORAL | Status: DC

## 2019-08-21 MED ORDER — ACETAMINOPHEN 325 MG PO TABS: 650.0000 mg | ORAL_TABLET | ORAL | Status: DC | PRN

## 2019-08-21 MED ORDER — STROKE: EARLY STAGES OF RECOVERY BOOK: Freq: Once | Status: DC

## 2019-08-21 MED ORDER — PIPERACILLIN-TAZOBACTAM 3.375 G IVPB
3.3750 g | Freq: Three times a day (TID) | INTRAVENOUS | Status: DC
  Administered 2019-08-21 – 2019-08-23 (×5): 3.375 g via INTRAVENOUS
  Filled 2019-08-21 (×8): qty 50

## 2019-08-21 MED ORDER — INSULIN ASPART 100 UNIT/ML ~~LOC~~ SOLN
0.0000 [IU] | Freq: Three times a day (TID) | SUBCUTANEOUS | Status: DC
  Administered 2019-08-21: 12:00:00 7 [IU] via SUBCUTANEOUS
  Administered 2019-08-21: 9 [IU] via SUBCUTANEOUS

## 2019-08-21 MED ORDER — INSULIN ASPART 100 UNIT/ML ~~LOC~~ SOLN
0.0000 [IU] | Freq: Three times a day (TID) | SUBCUTANEOUS | Status: DC
  Administered 2019-08-22: 3 [IU] via SUBCUTANEOUS
  Administered 2019-08-22: 1 [IU] via SUBCUTANEOUS
  Administered 2019-08-22: 7 [IU] via SUBCUTANEOUS
  Administered 2019-08-23: 5 [IU] via SUBCUTANEOUS
  Administered 2019-08-23: 3 [IU] via SUBCUTANEOUS

## 2019-08-21 MED ORDER — METRONIDAZOLE 500 MG PO TABS
500.0000 mg | ORAL_TABLET | Freq: Three times a day (TID) | ORAL | Status: DC
  Administered 2019-08-21: 500 mg via ORAL
  Filled 2019-08-21: qty 1

## 2019-08-21 MED ORDER — ENOXAPARIN SODIUM 30 MG/0.3ML ~~LOC~~ SOLN
30.0000 mg | SUBCUTANEOUS | Status: DC
  Administered 2019-08-21 – 2019-08-22 (×2): 30 mg via SUBCUTANEOUS
  Filled 2019-08-21 (×2): qty 0.3

## 2019-08-21 MED ORDER — ACETAMINOPHEN 160 MG/5ML PO SOLN: 650.0000 mg | ORAL | Status: DC | PRN

## 2019-08-21 MED ORDER — CIPROFLOXACIN HCL 250 MG PO TABS
250.0000 mg | ORAL_TABLET | Freq: Two times a day (BID) | ORAL | Status: DC
  Administered 2019-08-21: 250 mg via ORAL
  Filled 2019-08-21: qty 1

## 2019-08-21 MED ORDER — INSULIN DETEMIR 100 UNIT/ML FLEXPEN: 7.0000 [IU] | PEN_INJECTOR | Freq: Two times a day (BID) | SUBCUTANEOUS | Status: DC

## 2019-08-21 MED ORDER — SENNOSIDES-DOCUSATE SODIUM 8.6-50 MG PO TABS: 1.0000 | ORAL_TABLET | Freq: Every evening | ORAL | Status: DC | PRN

## 2019-08-21 MED ORDER — ASPIRIN EC 81 MG PO TBEC
81.0000 mg | DELAYED_RELEASE_TABLET | Freq: Every day | ORAL | Status: DC
  Administered 2019-08-21: 12:00:00 81 mg via ORAL
  Filled 2019-08-21: qty 1

## 2019-08-21 MED ORDER — PIPERACILLIN-TAZOBACTAM 3.375 G IVPB 30 MIN: 3.3750 g | Freq: Three times a day (TID) | INTRAVENOUS | Status: DC

## 2019-08-21 MED ORDER — INSULIN DETEMIR 100 UNIT/ML ~~LOC~~ SOLN
7.0000 [IU] | Freq: Two times a day (BID) | SUBCUTANEOUS | Status: DC
  Administered 2019-08-21 – 2019-08-22 (×4): 7 [IU] via SUBCUTANEOUS
  Filled 2019-08-21 (×7): qty 0.07

## 2019-08-21 MED ORDER — LEVOTHYROXINE SODIUM 100 MCG PO TABS
100.0000 ug | ORAL_TABLET | Freq: Every day | ORAL | Status: DC
  Administered 2019-08-22 – 2019-08-23 (×2): 100 ug via ORAL
  Filled 2019-08-21 (×2): qty 1

## 2019-08-21 MED ORDER — ACETAMINOPHEN 650 MG RE SUPP: 650.0000 mg | RECTAL | Status: DC | PRN

## 2019-08-21 MED ORDER — ATORVASTATIN CALCIUM 40 MG PO TABS
40.0000 mg | ORAL_TABLET | Freq: Every day | ORAL | Status: DC
  Administered 2019-08-21 – 2019-08-23 (×3): 40 mg via ORAL
  Filled 2019-08-21 (×3): qty 1

## 2019-08-21 MED ORDER — PANTOPRAZOLE SODIUM 40 MG PO TBEC
40.0000 mg | DELAYED_RELEASE_TABLET | Freq: Every day | ORAL | Status: DC
  Administered 2019-08-21 – 2019-08-23 (×3): 40 mg via ORAL
  Filled 2019-08-21 (×3): qty 1

## 2019-08-21 NOTE — Progress Notes (Signed)
EEG complete - results pending 

## 2019-08-21 NOTE — ED Triage Notes (Signed)
Pt arrives EMS from home where family reported LKW of 8PM last night. Family reported right sided facial droop and slurred speech which has resolved on arrival. Pt only complaint right now in right arm pain, which family reported in chronic.
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
## 2019-08-21 NOTE — Progress Notes (Signed)
New Admission Note: Patient admitted to room 5M08 from [facility] ED  Arrival Method:  Via stretcher Mental Orientation: alert and oriented x 4 Telemetry: Initiated per order Assessment: Completed Skin: Intact IV: L wrist Pain: Denies Tubes: None Safety Measures: Safety Fall Prevention Plan has been given, discussed and signed Admission: Completed [facility] Orientation: Patient has been orientated to the room, unit and staff.  Family: None at the bedside  Orders have been reviewed and implemented. Will continue to monitor the patient. Call light has been placed within reach and bed alarm has been activated.   Dorthey Sawyer, RN

## 2019-08-21 NOTE — ED Provider Notes (Signed)
[facility] EMERGENCY DEPARTMENT Provider Note   CSN: [id number] Arrival date & time: 08/21/19  [id number]     History Chief Complaint  Patient presents with  . TIA Symptoms    [name] [name] is a 86 y.o. female.  She has a history of diabetes hypertension.  She was just admitted to the hospital and discharged yesterday for altered mental status that was ultimately diagnosed as colitis and treated with antibiotics.  Possible allergic reaction to a herbal remedy.  Last known well was 8 PM last night.  Per EMS daughter noted the patient to have slurred speech and right facial droop and difficulty standing this am.  EMS found the patient with right facial droop, right gaze preference, slurred speech.  When they tried to stand the patient to move her over to the stretcher she had a syncopal event.  During transport the patient seemed to improve in her deficits and is now verbal.  Patient only complaint is right arm pain which reportedly is chronic.  The history is provided by the patient and the EMS personnel.  Cerebrovascular Accident This is a new problem. Episode onset: LKW 8pm yesterday. The problem has been resolved. Pertinent negatives include no chest pain, no abdominal pain, no headaches and no shortness of breath. Nothing aggravates the symptoms. Nothing relieves the symptoms. She has tried nothing for the symptoms. The treatment provided significant relief.       Past Medical History:  Diagnosis Date  . Arthritis    "knees, legs" (06/10/2016)  . Ascites   . Chronic kidney disease    "related to my diabetes"  . Chronic lower back pain   . Diabetes mellitus without complication ([place])   . Eczema   . GERD (gastroesophageal reflux disease)   . High cholesterol   . Hyperlipidemia   . Hypertension   . Hypothyroidism   . OAB (overactive bladder)   . Osteoporosis   . Thyroid disease   . Type II diabetes mellitus ([place])   . Urticaria     Patient Active Problem List    Diagnosis Date Noted  . Acute encephalopathy 08/18/2019  . Enterocolitis 08/18/2019  . Hyperglycemia 08/18/2019  . Chronic kidney disease   . Pressure injury of skin 12/09/2016  . SBO (small bowel obstruction) s/p lysis of adhesions 12/03/2016 12/04/2016  . Acute urinary retention 11/27/2016  . Ascites 11/27/2016  . Abdominal distension   . Facial droop   . Hyperkalemia 11/25/2016  . Sepsis ([place]) 11/25/2016  . AKI (acute kidney injury) ([place]) 11/25/2016  . Delirium 11/25/2016  . Age-related osteoporosis without current pathological fracture 10/01/2016  . Hyperlipidemia 06/10/2016  . GERD (gastroesophageal reflux disease) 06/10/2016  . Insomnia 06/10/2016  . Viral gastroenteritis   . Right hip pain 05/06/2015  . Vaginal atrophy 11/01/2013  . ECZEMA 07/24/2008  . INTERMITTENT VERTIGO 07/24/2008  . Constipation 11/16/2007  . INSOMNIA 08/12/2007  . Essential hypertension 02/14/2007  . ALLERGIC RHINITIS 02/14/2007  . Hypothyroidism, acquired 01/26/2007  . Type 2 diabetes mellitus without complication, with long-term current use of insulin ([place]) 01/26/2007  . HYPERLIPIDEMIA 01/26/2007  . GERD 01/26/2007  . DEGENERATIVE JOINT DISEASE 01/26/2007    Past Surgical History:  Procedure Laterality Date  . ABDOMINAL HYSTERECTOMY    . KNEE ARTHROSCOPY    . LAPAROTOMY N/A 12/03/2016   Procedure: EXPLORATORY LAPAROTOMY, LYSIS OF ADHESIONS;  Surgeon: Armandina Gemma, MD;  Location: [facility] ORS;  Service: General;  Laterality: N/A;  . vocal cord polyps  OB History    Gravida  5   Para  5   Term  0   Preterm  0   AB  0   Living  4     SAB  0   TAB  0   Ectopic  0   Multiple      Live Births           Obstetric Comments  1 son passed away         Family History  Problem Relation Age of Onset  . Diabetes Mother   . Hypertension Mother   . Diabetes Sister   . Diabetes Brother   . Asthma Father   . Allergic rhinitis Neg Hx   . Eczema Neg Hx   . Urticaria Neg  Hx     Social History   Tobacco Use  . Smoking status: Never Smoker  . Smokeless tobacco: Former Systems developer    Types: Chew  Substance Use Topics  . Alcohol use: No  . Drug use: No    Home Medications Prior to Admission medications   Medication Sig Start Date End Date Taking? Authorizing Provider  acetaminophen (TYLENOL) 500 MG tablet Take 500 mg by mouth every 6 (six) hours as needed for mild pain.     [provider]  alendronate (FOSAMAX) 70 MG tablet TAKE 1 TABLET BY MOUTH EVERY 7 DAYS ON AN EMPTY STOMACH WITH A FULL GLASS OF WATER Patient taking differently: Take 70 mg by mouth once a week. Pt takes Wednesday of each week 06/27/18   Princess Bruins, MD  aspirin EC 81 MG tablet Take 81 mg by mouth daily.    [provider]  atorvastatin (LIPITOR) 40 MG tablet Take 40 mg by mouth daily at 6 PM.  02/16/19   [provider]  B-D UF III MINI PEN NEEDLES 31G X 5 MM MISC USE AS DIRECTED TID 02/14/19   [provider]  calcium-vitamin D 250-100 MG-UNIT tablet Take 1 tablet by mouth daily.     [provider]  cholecalciferol (VITAMIN D) 1000 units tablet Take 1,000 Units by mouth daily.    [provider]  ciprofloxacin (CIPRO) 250 MG tablet Take 1 tablet (250 mg total) by mouth 2 (two) times daily. 08/20/19   Geradine Girt, DO  FLUAD 0.5 ML SUSY ADM 0.5ML IM UTD 03/09/18   [provider]  insulin aspart (NOVOLOG) 100 UNIT/ML injection Inject 6-8 Units into the skin 3 (three) times daily before meals. SLIDING SCALE: 70-199= 6 UNITS 200-299= 7 UNITS >300= 8 UNITS    [provider]  Insulin Detemir (LEVEMIR FLEXTOUCH) 100 UNIT/ML Pen Inject 7 Units into the skin 2 (two) times daily.     [provider]  Iron-FA-B Cmp-C-Biot-Probiotic (FUSION PLUS) CAPS Take 1 capsule by mouth daily.  03/02/19   [provider]  levothyroxine (SYNTHROID, LEVOTHROID) 100 MCG tablet Take 100 mcg by mouth daily before  breakfast.    [provider]  losartan (COZAAR) 50 MG tablet Take 50 mg by mouth daily.  01/24/19   [provider]  metroNIDAZOLE (FLAGYL) 500 MG tablet Take 1 tablet (500 mg total) by mouth every 8 (eight) hours. 08/20/19   Geradine Girt, DO  mometasone (NASONEX) 50 MCG/ACT nasal spray Place 2 sprays into the nose daily as needed (nasal congestion). 04/05/19   Kennith Gain, MD  Multiple Vitamin (MULTIVITAMIN WITH MINERALS) TABS tablet Take 1 tablet by mouth daily.  [provider]  Omega-3 Fatty Acids (FISH OIL) 1000 MG CAPS Take 1,000 mg by mouth 2 (two) times daily.    [provider]  pantoprazole (PROTONIX) 40 MG tablet Take 40 mg by mouth daily.  03/25/19   [provider]    Allergies    Sulfonamide derivatives, Bactrim [sulfamethoxazole-trimethoprim], and Lisinopril  Review of Systems   Review of Systems  Constitutional: Negative for fever.  HENT: Negative for sore throat.   Eyes: Negative for visual disturbance.  Respiratory: Negative for shortness of breath.   Cardiovascular: Negative for chest pain.  Gastrointestinal: Negative for abdominal pain.  Genitourinary: Negative for dysuria.  Musculoskeletal: Negative for neck pain.  Skin: Negative for rash.  Neurological: Negative for headaches.    Physical Exam Updated Vital Signs BP (!) 154/62 (BP Location: Right Arm)   Pulse 72   Temp 98.6 F (37 C) (Oral)   Resp 18   SpO2 100%   Physical Exam Vitals and nursing note reviewed.  Constitutional:      General: She is not in acute distress.    Appearance: She is well-developed.  HENT:     Head: Normocephalic and atraumatic.  Eyes:     Conjunctiva/sclera: Conjunctivae normal.  Cardiovascular:     Rate and Rhythm: Normal rate and regular rhythm.     Heart sounds: No murmur.  Pulmonary:     Effort: Pulmonary effort is normal. No respiratory distress.     Breath sounds: Normal breath sounds.  Abdominal:      Palpations: Abdomen is soft.     Tenderness: There is no abdominal tenderness.  Musculoskeletal:        General: No deformity or signs of injury.     Cervical back: Neck supple.  Skin:    General: Skin is warm and dry.     Capillary Refill: Capillary refill takes less than 2 seconds.  Neurological:     General: No focal deficit present.     Mental Status: She is alert. Mental status is at baseline.     ED Results / Procedures / Treatments   Labs (all labs ordered are listed, but only abnormal results are displayed) Labs Reviewed  CBC - Abnormal; Notable for the following components:      Result Value   WBC 11.3 (*)    RBC 3.24 (*)    Hemoglobin 10.4 (*)    HCT 31.5 (*)    All other components within normal limits  COMPREHENSIVE METABOLIC PANEL - Abnormal; Notable for the following components:   Glucose, Bld 212 (*)    Creatinine, Ser 1.63 (*)    Albumin 3.1 (*)    GFR calc non Af Amer 29 (*)    GFR calc Af Amer 33 (*)    All other components within normal limits  URINALYSIS, ROUTINE W REFLEX MICROSCOPIC - Abnormal; Notable for the following components:   Glucose, UA 150 (*)    Ketones, ur 5 (*)    Protein, ur 30 (*)    All other components within normal limits  CBG MONITORING, ED - Abnormal; Notable for the following components:   Glucose-Capillary 185 (*)    All other components within normal limits  CBG MONITORING, ED - Abnormal; Notable for the following components:   Glucose-Capillary 323 (*)    All other components within normal limits  CBG MONITORING, ED - Abnormal; Notable for the following components:   Glucose-Capillary 366 (*)    All other components within normal limits  SARS CORONAVIRUS 2 (TAT 6-24 HRS)  RAPID URINE DRUG SCREEN, HOSP PERFORMED  ETHANOL  PROTIME-INR    EKG EKG Interpretation  Date/Time:  Monday August 21 2019 07:09:59 EST Ventricular Rate:  77 PR Interval:    QRS Duration: 102 QT Interval:  397 QTC Calculation: 450 R  Axis:   55 Text Interpretation: Sinus rhythm RSR' in V1 or V2, probably normal variant No significant change since 1/21 Confirmed by Aletta Edouard [id number]) on 08/21/2019 7:19:15 AM   Radiology EEG  Result Date: 08/21/2019 Lora Havens, MD     08/21/2019  4:17 PM Patient Name: [name] [name] MRN: [id number] Epilepsy Attending: Lora Havens Referring Physician/Provider: Dr. Fuller Plan Date: 08/21/2019 Duration: 23.28 minutes Patient history:  86 year-old female who presented with altered mental status, garbled speech as well as right gaze preference and right facial droop transiently.  EEG to evaluate for seizure. Level of alertness: Awake, asleep AEDs during EEG study: None Technical aspects: This EEG study was done with scalp electrodes positioned according to the 10-20 International system of electrode placement. Electrical activity was acquired at a sampling rate of 500Hz  and reviewed with a high frequency filter of 70Hz  and a low frequency filter of 1Hz . EEG data were recorded continuously and digitally stored. Description:  The posterior dominant rhythm consists of 8 Hz activity of moderate voltage (25-35 uV) seen predominantly in posterior head regions, symmetric and reactive to eye opening and eye closing.  Sleep was characterized by intermittent rhythmic 2-4Hz  theta- delta slowing. Hyperventilation and photic stimulation were not performed. IMPRESSION: This study is within normal limits. No seizures or epileptiform discharges were seen throughout the recording. Lora Havens   CT HEAD WO CONTRAST  Result Date: 08/21/2019 CLINICAL DATA:  Transit ischemic attack. RIGHT-sided deficit. EXAM: CT HEAD WITHOUT CONTRAST TECHNIQUE: Contiguous axial images were obtained from the base of the skull through the vertex without intravenous contrast. COMPARISON:  None. FINDINGS: Brain: No acute intracranial hemorrhage. No focal mass lesion. No CT evidence of acute infarction. No midline shift or mass  effect. No hydrocephalus. Basilar cisterns are patent. There are periventricular and subcortical white matter hypodensities. Generalized cortical atrophy. Vascular: No hyperdense vessel or unexpected calcification. Skull: Normal. Negative for fracture or focal lesion. Sinuses/Orbits: Paranasal sinuses and mastoid air cells are clear. Orbits are clear. Other: None. IMPRESSION: 1. No acute intracranial findings.  No change from prior. 2. Atrophy and white matter microvascular disease. Electronically Signed   By: Suzy Bouchard M.D.   On: 08/21/2019 08:26    Procedures Procedures (including critical care time)  Medications Ordered in ED Medications  aspirin EC tablet 81 mg (81 mg Oral Given 08/21/19 1209)  atorvastatin (LIPITOR) tablet 40 mg (has no administration in time range)  levothyroxine (SYNTHROID) tablet 100 mcg (has no administration in time range)   stroke: mapping our early stages of recovery book (0 each Does not apply Hold 08/21/19 1219)  0.9 %  sodium chloride infusion ( Intravenous New Bag/Given 08/21/19 1210)  acetaminophen (TYLENOL) tablet 650 mg (has no administration in time range)    Or  acetaminophen (TYLENOL) 160 MG/5ML solution 650 mg (has no administration in time range)    Or  acetaminophen (TYLENOL) suppository 650 mg (has no administration in time range)  senna-docusate (Senokot-S) tablet 1 tablet (has no administration in time range)  enoxaparin (LOVENOX) injection 30 mg (has no administration in time range)  insulin aspart (novoLOG) injection 0-9 Units (7 Units Subcutaneous Given 08/21/19 1219)  insulin detemir (LEVEMIR) injection 7 Units (7 Units Subcutaneous Given 08/21/19 1211)  ciprofloxacin (CIPRO) tablet 250 mg (250 mg Oral Given 08/21/19 1349)  metroNIDAZOLE (FLAGYL) tablet 500 mg (500 mg Oral Given 08/21/19 1350)  pantoprazole (PROTONIX) EC tablet 40 mg (40 mg Oral Given 08/21/19 1349)    ED Course  I have reviewed the triage vital signs and the nursing  notes.  Pertinent labs & imaging results that were available during my care of the patient were reviewed by me and considered in my medical decision making (see chart for details).  Clinical Course as of Aug 21 1719  Mon Aug 21, 2019  0719 Differential includes TIA, bleed, metabolic derangement, arrhythmia, seizure.    [MB]  [id number] I called the patient's daughter Howell Rucks who stayed with her last night.  She said she was fine at 8 PM when they went to bed and she heard her get up at 2 in the morning and she still seemed fine.  This morning around 6 when they got up she was good and then she heard her cry out.  She found her mom with some garbled speech and diaphoretic.  She did not appreciate any focal weakness, but was having difficulty walking.  She checked her blood sugar and it was 170s.  She seemed to be in and out of it but never fully losing consciousness.  That is when EMS came. Right arm pain has only been since yesterday.    [MB]  0815 Head CT interpreted by me as no gross bleed or infarction noted.  Awaiting radiology reading.   [MB]  [phone] Discussed with Dr. Cheral Marker from neurology who will evaluate the patient.    [MB]  [id number] CT reading not crossing in from epic but the final reading is no acute findings.  Atrophy and white matter microvascular disease.    [MB]  20 Discussed with Dr. Tamala Julian from [facility] who will evaluate the patient for admission.   [MB]    Clinical Course User Index [MB] Hayden Rasmussen, MD   MDM Rules/Calculators/A&P                       Final Clinical Impression(s) / ED Diagnoses Final diagnoses:  Transient alteration of awareness  Right arm pain    Rx / DC Orders ED Discharge Orders    None       Hayden Rasmussen, MD 08/21/19 1723

## 2019-08-21 NOTE — ED Notes (Signed)
Lunch Tray Ordered @ 1300.

## 2019-08-21 NOTE — H&P (Signed)
History and Physical    ACE HOCUTT [id number] DOB: 1934-06-26 DOA: 08/21/2019  Referring MD/NP/PA: Lala Lund, MD PCP: Lajean Manes, MD  Patient coming from: Home where she lives with her daughter via EMS  Chief Complaint: Altered mental status  I have personally briefly reviewed patient's old medical records in [place]   HPI: [name] [name] is a 86 y.o. female with medical history significant of hypertension, hyperlipidemia, diabetes mellitus type 2, hypothyroidism, chronic back pain, dementia, and GERD.  Presents after being found to be acutely altered this morning.  Last known normal around 8 PM last night and while helping her to the bathroom earlier this morning around 2:30 AM.  Her daughter gives most of her history as the patient has some dementia.  Patient had just recently been discharged from the hospital yesterday after initially presenting with altered mental status.  Found to have enterocolitis for which patient was prescribed antibiotics of ciprofloxacin and metronidazole which she had 3 more days to complete her course. When she got home yesterday the patient was reportedly noted to be very fatigued.  This morning when she got up around 12/23/1926 she was found standing by the sink.  She seemed as if she were going to collapse and kept stating that her legs were weak.  Her daughter was able to sit her down so that she did not fall.  The patient was complaining of feeling dizzy, sweating profusely, confused, and babbling.  Patient reports that she is here because of right arm and neck pain.  Her daughter reports that this has been more of a chronic issue, and the patient had not recently fallen.  En route with EMS they noted right-sided facial droop and slurred speech which resolved prior to arrival.  ED Course: Upon admission into the emergency department patient was seen to have relatively stable vital signs.  Labs significant for WBC 11.3, hemoglobin 10.4, BUN  21, and creatinine 1.63.  CT scan of the brain did not show any acute abnormalities.  Urinalysis was negative for any acute abnormalities.  Neurology had been formally consulted   Review of Systems  Unable to perform ROS: Dementia  Musculoskeletal: Positive for joint pain, myalgias and neck pain.    Past Medical History:  Diagnosis Date  . Arthritis    "knees, legs" (06/10/2016)  . Ascites   . Chronic kidney disease    "related to my diabetes"  . Chronic lower back pain   . Diabetes mellitus without complication ([place])   . Eczema   . GERD (gastroesophageal reflux disease)   . High cholesterol   . Hyperlipidemia   . Hypertension   . Hypothyroidism   . OAB (overactive bladder)   . Osteoporosis   . Thyroid disease   . Type II diabetes mellitus ([place])   . Urticaria     Past Surgical History:  Procedure Laterality Date  . ABDOMINAL HYSTERECTOMY    . KNEE ARTHROSCOPY    . LAPAROTOMY N/A 12/03/2016   Procedure: EXPLORATORY LAPAROTOMY, LYSIS OF ADHESIONS;  Surgeon: Armandina Gemma, MD;  Location: [facility];  Service: General;  Laterality: N/A;  . vocal cord polyps       reports that she has never smoked. She quit smokeless tobacco use about 36 years ago.  Her smokeless tobacco use included chew. She reports that she does not drink alcohol or use drugs.  Allergies  Allergen Reactions  . Sulfonamide Derivatives Hives  . Bactrim [Sulfamethoxazole-Trimethoprim] Rash    Reported  by [employer]  . Lisinopril Cough    Reported by [facility]    Family History  Problem Relation Age of Onset  . Diabetes Mother   . Hypertension Mother   . Diabetes Sister   . Diabetes Brother   . Asthma Father   . Allergic rhinitis Neg Hx   . Eczema Neg Hx   . Urticaria Neg Hx     Prior to Admission medications   Medication Sig Start Date End Date Taking? Authorizing Provider  acetaminophen (TYLENOL) 500 MG tablet Take 500 mg by mouth every 6 (six) hours as needed for mild pain.      [provider]  alendronate (FOSAMAX) 70 MG tablet TAKE 1 TABLET BY MOUTH EVERY 7 DAYS ON AN EMPTY STOMACH WITH A FULL GLASS OF WATER Patient taking differently: Take 70 mg by mouth once a week. Pt takes Wednesday of each week 06/27/18   Princess Bruins, MD  aspirin EC 81 MG tablet Take 81 mg by mouth daily.    [provider]  atorvastatin (LIPITOR) 40 MG tablet Take 40 mg by mouth daily at 6 PM.  02/16/19   [provider]  B-D UF III MINI PEN NEEDLES 31G X 5 MM MISC USE AS DIRECTED TID 02/14/19   [provider]  calcium-vitamin D 250-100 MG-UNIT tablet Take 1 tablet by mouth daily.     [provider]  cholecalciferol (VITAMIN D) 1000 units tablet Take 1,000 Units by mouth daily.    [provider]  ciprofloxacin (CIPRO) 250 MG tablet Take 1 tablet (250 mg total) by mouth 2 (two) times daily. 08/20/19   Geradine Girt, DO  FLUAD 0.5 ML SUSY ADM 0.5ML IM UTD 03/09/18   [provider]  insulin aspart (NOVOLOG) 100 UNIT/ML injection Inject 6-8 Units into the skin 3 (three) times daily before meals. SLIDING SCALE: 70-199= 6 UNITS 200-299= 7 UNITS >300= 8 UNITS    [provider]  Insulin Detemir (LEVEMIR FLEXTOUCH) 100 UNIT/ML Pen Inject 7 Units into the skin 2 (two) times daily.     [provider]  Iron-FA-B Cmp-C-Biot-Probiotic (FUSION PLUS) CAPS Take 1 capsule by mouth daily.  03/02/19   [provider]  levothyroxine (SYNTHROID, LEVOTHROID) 100 MCG tablet Take 100 mcg by mouth daily before breakfast.    [provider]  losartan (COZAAR) 50 MG tablet Take 50 mg by mouth daily.  01/24/19   [provider]  metroNIDAZOLE (FLAGYL) 500 MG tablet Take 1 tablet (500 mg total) by mouth every 8 (eight) hours. 08/20/19   Geradine Girt, DO  mometasone (NASONEX) 50 MCG/ACT nasal spray Place 2 sprays into the nose daily as needed (nasal congestion). 04/05/19   Kennith Gain, MD  Multiple  Vitamin (MULTIVITAMIN WITH MINERALS) TABS tablet Take 1 tablet by mouth daily.    [provider]  Omega-3 Fatty Acids (FISH OIL) 1000 MG CAPS Take 1,000 mg by mouth 2 (two) times daily.    [provider]  pantoprazole (PROTONIX) 40 MG tablet Take 40 mg by mouth daily.  03/25/19   [provider]    Physical Exam:  Constitutional: Elderly female who appears to be in no acute distress at this time Vitals:   08/21/19 0915 08/21/19 0930 08/21/19 0945 08/21/19 1000  BP: (!) 157/55 (!) 149/61 (!) 138/54 (!) 141/56  Pulse:      Resp: 13 14 15  (!) 24  Temp:      TempSrc:  SpO2:       Eyes: PERRL, lids and conjunctivae normal ENMT: Mucous membranes are moist. Posterior pharynx clear of any exudate or lesions.  Neck: normal, supple, no masses, no thyromegaly Respiratory: clear to auscultation bilaterally, no wheezing, no crackles. Normal respiratory effort. No accessory muscle use.  Cardiovascular: Regular rate and rhythm, no murmurs / rubs / gallops. No extremity edema. 2+ pedal pulses. No carotid bruits.  Abdomen: no tenderness, no masses palpated. No hepatosplenomegaly. Bowel sounds positive.  Musculoskeletal: no clubbing / cyanosis.  Decreased range of motion noted on the right arm secondary to pain with movement. Skin: no rashes, lesions, ulcers. No induration Neurologic: CN 2-12 grossly intact. Sensation intact, DTR normal. Strength 5/5 in all 4.  Psychiatric: Alert and oriented to person and place.  Poor recent memory. Normal mood.     Labs on Admission: I have personally reviewed following labs and imaging studies  CBC: Recent Labs  Lab 08/17/19 2247 08/21/19 0750  WBC 9.1 11.3*  NEUTROABS 8.2*  --   HGB 12.1 10.4*  HCT 36.5 31.5*  MCV 96.6 97.2  PLT 242 [id number]   Basic Metabolic Panel: Recent Labs  Lab 08/17/19 2247 08/18/19 0826 08/19/19 0422 08/20/19 0659 08/21/19 0750  NA 131* 131* 134* 136 135  K 4.4 4.4 3.6 4.0 4.0  CL 92* 94* 98  100 98  CO2 22 23 28 25 25   GLUCOSE 454* 457* 77 116* 212*  BUN 22 21 17 14 21   CREATININE 1.29* 1.41* 1.28* 1.30* 1.63*  CALCIUM 10.3 9.8 9.1 9.5 9.4   GFR: Estimated Creatinine Clearance: 24 mL/min (A) (by C-G formula based on SCr of 1.63 mg/dL (H)). Liver Function Tests: Recent Labs  Lab 08/17/19 2247 08/21/19 0750  AST 35 32  ALT 32 25  ALKPHOS 85 71  BILITOT 0.7 0.7  PROT 8.0 6.5  ALBUMIN 4.1 3.1*   No results for input(s): LIPASE, AMYLASE in the last 168 hours. Recent Labs  Lab 08/18/19 0828  AMMONIA 27   Coagulation Profile: Recent Labs  Lab 08/17/19 2314 08/21/19 0750  INR 0.9 1.1   Cardiac Enzymes: No results for input(s): CKTOTAL, CKMB, CKMBINDEX, TROPONINI in the last 168 hours. BNP (last 3 results) No results for input(s): PROBNP in the last 8760 hours. HbA1C: No results for input(s): HGBA1C in the last 72 hours. CBG: Recent Labs  Lab 08/19/19 1914 08/19/19 2223 08/20/19 0716 08/20/19 1120 08/21/19 0709  GLUCAP 340* 165* 117* 229* 185*   Lipid Profile: No results for input(s): CHOL, HDL, LDLCALC, TRIG, CHOLHDL, LDLDIRECT in the last 72 hours. Thyroid Function Tests: No results for input(s): TSH, T4TOTAL, FREET4, T3FREE, THYROIDAB in the last 72 hours. Anemia Panel: Recent Labs    08/18/19 1335  VITAMINB12 3,547*   Urine analysis:    Component Value Date/Time   COLORURINE YELLOW 08/17/2019 2317   APPEARANCEUR HAZY (A) 08/17/2019 2317   LABSPEC 1.015 08/17/2019 2317   PHURINE 8.0 08/17/2019 2317   GLUCOSEU >=500 (A) 08/17/2019 2317   HGBUR NEGATIVE 08/17/2019 2317   BILIRUBINUR NEGATIVE 08/17/2019 2317   BILIRUBINUR neg 05/06/2015 1421   KETONESUR 20 (A) 08/17/2019 2317   PROTEINUR 100 (A) 08/17/2019 2317   UROBILINOGEN 1.0 05/06/2015 1421   UROBILINOGEN 0.2 12/25/2014 1206   NITRITE NEGATIVE 08/17/2019 2317   LEUKOCYTESUR NEGATIVE 08/17/2019 2317   Sepsis Labs: Recent Results (from the past 240 hour(s))  Blood culture (routine  x 2)     Status: None (Preliminary result)   Collection Time:  08/17/19 12:36 AM   Specimen: BLOOD LEFT HAND  Result Value Ref Range Status   Specimen Description BLOOD LEFT HAND  Final   Special Requests   Final    BOTTLES DRAWN AEROBIC AND ANAEROBIC Blood Culture results may not be optimal due to an inadequate volume of blood received in culture bottles   Culture   Final    NO GROWTH 2 DAYS Performed at [facility], [place] [address] [place], [place] [address]    Report Status PENDING  Incomplete  Blood culture (routine x 2)     Status: None (Preliminary result)   Collection Time: 08/17/19 12:37 AM   Specimen: BLOOD  Result Value Ref Range Status   Specimen Description BLOOD RIGHT ANTECUBITAL  Final   Special Requests   Final    BOTTLES DRAWN AEROBIC AND ANAEROBIC Blood Culture results may not be optimal due to an inadequate volume of blood received in culture bottles   Culture   Final    NO GROWTH 2 DAYS Performed at [facility], [place] [address] [place], [place] [address]    Report Status PENDING  Incomplete  Respiratory Panel by RT PCR (Flu A&B, Covid) - Nasopharyngeal Swab     Status: None   Collection Time: 08/17/19 11:15 PM   Specimen: Nasopharyngeal Swab  Result Value Ref Range Status   SARS Coronavirus 2 by RT PCR NEGATIVE NEGATIVE Final    Comment: (NOTE) SARS-CoV-2 target nucleic acids are NOT DETECTED. The SARS-CoV-2 RNA is generally detectable in upper respiratoy specimens during the acute phase of infection. The lowest concentration of SARS-CoV-2 viral copies this assay can detect is 131 copies/mL. A negative result does not preclude SARS-Cov-2 infection and should not be used as the sole basis for treatment or other patient management decisions. A negative result may occur with  improper specimen collection/handling, submission of specimen other than nasopharyngeal swab, presence of viral mutation(s) within the areas targeted by this assay, and  inadequate number of viral copies (<131 copies/mL). A negative result must be combined with clinical observations, patient history, and epidemiological information. The expected result is Negative. Fact Sheet for Patients:  PinkCheek.be Fact Sheet for Healthcare Providers:  GravelBags.it This test is not yet ap proved or cleared by the [place] FDA and  has been authorized for detection and/or diagnosis of SARS-CoV-2 by FDA under an Emergency Use Authorization (EUA). This EUA will remain  in effect (meaning this test can be used) for the duration of the COVID-19 declaration under Section 564(b)(1) of the Act, 21 U.S.C. section 360bbb-3(b)(1), unless the authorization is terminated or revoked sooner.    Influenza A by PCR NEGATIVE NEGATIVE Final   Influenza B by PCR NEGATIVE NEGATIVE Final    Comment: (NOTE) The Xpert Xpress SARS-CoV-2/FLU/RSV assay is intended as an aid in  the diagnosis of influenza from Nasopharyngeal swab specimens and  should not be used as a sole basis for treatment. Nasal washings and  aspirates are unacceptable for Xpert Xpress SARS-CoV-2/FLU/RSV  testing. Fact Sheet for Patients: PinkCheek.be Fact Sheet for Healthcare Providers: GravelBags.it This test is not yet approved or cleared by the [place] FDA and  has been authorized for detection and/or diagnosis of SARS-CoV-2 by  FDA under an Emergency Use Authorization (EUA). This EUA will remain  in effect (meaning this test can be used) for the duration of the  Covid-19 declaration under Section 564(b)(1) of the Act, 21  U.S.C. section 360bbb-3(b)(1), unless the authorization is  terminated or revoked. Performed at [facility], [place] [address], [place], [place] [address]   Urine culture     Status: None   Collection Time: 08/17/19 11:20 PM   Specimen: Urine, Clean Catch    Result Value Ref Range Status   Specimen Description URINE, CLEAN CATCH  Final   Special Requests NONE  Final   Culture   Final    NO GROWTH Performed at [facility], [place] [address], [place], [place] [address]    Report Status 08/18/2019 FINAL  Final     Radiological Exams on Admission: CT HEAD WO CONTRAST  Result Date: 08/21/2019 CLINICAL DATA:  Transit ischemic attack. RIGHT-sided deficit. EXAM: CT HEAD WITHOUT CONTRAST TECHNIQUE: Contiguous axial images were obtained from the base of the skull through the vertex without intravenous contrast. COMPARISON:  None. FINDINGS: Brain: No acute intracranial hemorrhage. No focal mass lesion. No CT evidence of acute infarction. No midline shift or mass effect. No hydrocephalus. Basilar cisterns are patent. There are periventricular and subcortical white matter hypodensities. Generalized cortical atrophy. Vascular: No hyperdense vessel or unexpected calcification. Skull: Normal. Negative for fracture or focal lesion. Sinuses/Orbits: Paranasal sinuses and mastoid air cells are clear. Orbits are clear. Other: None. IMPRESSION: 1. No acute intracranial findings.  No change from prior. 2. Atrophy and white matter microvascular disease. Electronically Signed   By: Suzy Bouchard M.D.   On: 08/21/2019 08:26    EKG: Independently reviewed.  Sinus rhythm at 77 bpm  Assessment/Plan Acute metabolic encephalopathy: Patient presents after being found to be acutely altered with reports of lower extremity weakness, garbled speech, and confusion.  Question the possibility of TIA versus seizure. -Admit to medical telemetry bed -Neurochecks -Check MRI/MRA of the brain w/o contrast and EEG -Check echocardiogram and carotid ultrasound -Increase aspirin 325 mg daily and continue statin -Allow for permissive hypertension -Appreciate neurology consultative services we will follow for further recommendations.  Leukocytosis: Acute.  WBC elevated at 11.3.  No  other clear source of infection noted. -Recheck WBC in a.m.  Enterocolitis: Prior to arrival.  Diagnosed during patient's last hospitalization.  Daughter notes patient had 3 days left of metronidazole and ciprofloxacin.  Patient over the course of the day spiked fever up to 100.4 F.   -Check blood cultures  -Change antibiotics to Zosyn  Acute kidney injury superimposed on chronic kidney disease: Patient presents with creatinine elevated up to 1.63 with BUN 21.  Baseline creatinine previously noted to be around 1.3 yesterday. -Normal saline IV fluids at 75 mL/h -Recheck creatinine in a.m.  Diabetes mellitus type 2 uncontrolled: Patient's last hemoglobin A1c noted to be 8.9 on 08/18/2019.  Currently takes Levemir 7 units twice daily and 6-8 units insulin with meals. -Hypoglycemic protocol -Continue current Levemir regimen -CBGs with sensitive SSI  Right shoulder pain: Per family acute on chronic.  Patient had difficulty moving the right arm due to reports of pain. -Check x-rays of the right shoulder  Essential hypertension: Blood pressures currently stable.  Home blood pressure medications include losartan. -Restart losartan when medically appropriate  Normocytic anemia: Hemoglobin 10.4 which appears acutely lower from previous of 12.1 on 1/28. -Continue to monitor  Hyperlipidemia -Continue Lipitor  Hypothyroidism: TSH 8.334 and free T4 1.23 on 08/17/2019. -Continue Synthroid -Unclear on why the TSH and the free T4 would be both elevated.  GERD -Continue Protonix   DVT prophylaxis: Lovenox Code Status: Full Family Communication: Discussed plan of care with the patient's daughter over the phone  Disposition Plan: Possible discharge home in 1 to 2 days Consults called: Neurology Admission status: Observation  Norval Morton MD [facility] Pager [phone]   If 7PM-7AM, please contact night-coverage [email or web address] Password [facility]  08/21/2019, 10:57 AM

## 2019-08-21 NOTE — Procedures (Signed)
Patient Name: HAZELYN BACANI  MRN: [id number]  Epilepsy Attending: Lora Havens  Referring Physician/Provider: Dr. Fuller Plan Date: 08/21/2019 Duration: 23.28 minutes  Patient history:  86 year-old female who presented with altered mental status, garbled speech as well as right gaze preference and right facial droop transiently.  EEG to evaluate for seizure.  Level of alertness: Awake, asleep  AEDs during EEG study: None  Technical aspects: This EEG study was done with scalp electrodes positioned according to the 10-20 International system of electrode placement. Electrical activity was acquired at a sampling rate of 500Hz  and reviewed with a high frequency filter of 70Hz  and a low frequency filter of 1Hz . EEG data were recorded continuously and digitally stored.   Description:  The posterior dominant rhythm consists of 8 Hz activity of moderate voltage (25-35 uV) seen predominantly in posterior head regions, symmetric and reactive to eye opening and eye closing.  Sleep was characterized by intermittent rhythmic 2-4Hz  theta- delta slowing. Hyperventilation and photic stimulation were not performed.  IMPRESSION: This study is within normal limits. No seizures or epileptiform discharges were seen throughout the recording.  Angeliyah Kirkey Barbra Sarks

## 2019-08-22 ENCOUNTER — Observation Stay (HOSPITAL_BASED_OUTPATIENT_CLINIC_OR_DEPARTMENT_OTHER): Payer: Medicare HMO

## 2019-08-22 DIAGNOSIS — G9341 Metabolic encephalopathy: Secondary | ICD-10-CM

## 2019-08-22 DIAGNOSIS — I361 Nonrheumatic tricuspid (valve) insufficiency: Secondary | ICD-10-CM | POA: Diagnosis not present

## 2019-08-22 DIAGNOSIS — N179 Acute kidney failure, unspecified: Secondary | ICD-10-CM | POA: Diagnosis not present

## 2019-08-22 DIAGNOSIS — R404 Transient alteration of awareness: Secondary | ICD-10-CM

## 2019-08-22 LAB — GLUCOSE, CAPILLARY
Glucose-Capillary: 136 mg/dL — ABNORMAL HIGH (ref 70–99)
Glucose-Capillary: 216 mg/dL — ABNORMAL HIGH (ref 70–99)
Glucose-Capillary: 310 mg/dL — ABNORMAL HIGH (ref 70–99)
Glucose-Capillary: 266 mg/dL — ABNORMAL HIGH (ref 70–99)

## 2019-08-22 LAB — ECHOCARDIOGRAM COMPLETE: Weight: 2243.4 oz

## 2019-08-22 MED ORDER — ASPIRIN EC 81 MG PO TBEC
81.0000 mg | DELAYED_RELEASE_TABLET | Freq: Every day | ORAL | Status: DC
  Administered 2019-08-23: 81 mg via ORAL
  Filled 2019-08-22: qty 1

## 2019-08-22 MED ORDER — DICLOFENAC SODIUM 1 % EX GEL
2.0000 g | Freq: Four times a day (QID) | CUTANEOUS | Status: DC
  Administered 2019-08-22 – 2019-08-23 (×3): 2 g via TOPICAL
  Filled 2019-08-22: qty 100

## 2019-08-22 MED ORDER — ACETAMINOPHEN 500 MG PO TABS
1000.0000 mg | ORAL_TABLET | Freq: Three times a day (TID) | ORAL | Status: DC
  Administered 2019-08-22 – 2019-08-23 (×4): 1000 mg via ORAL
  Filled 2019-08-22 (×4): qty 2

## 2019-08-22 MED ORDER — CLOPIDOGREL BISULFATE 75 MG PO TABS
75.0000 mg | ORAL_TABLET | Freq: Every day | ORAL | Status: DC
  Administered 2019-08-22 – 2019-08-23 (×2): 75 mg via ORAL
  Filled 2019-08-22 (×2): qty 1

## 2019-08-22 NOTE — Progress Notes (Signed)
Inpatient Diabetes Program Recommendations  AACE/ADA: New Consensus Statement on Inpatient Glycemic Control (2015)  Target Ranges:  Prepandial:   less than 140 mg/dL      Peak postprandial:   less than 180 mg/dL (1-2 hours)      Critically ill patients:  140 - 180 mg/dL   Lab Results  Component Value Date   GLUCAP 216 (H) 08/22/2019   HGBA1C 8.6 (H) 08/18/2019    Review of Glycemic Control Results for [name] [name], [name] [name] (MRN [id number]) as of 08/22/2019 12:59  Ref. Range 08/21/2019 17:52 08/21/2019 21:27 08/22/2019 06:57 08/22/2019 11:27  Glucose-Capillary Latest Ref Range: 70 - 99 mg/dL 398 (H) 338 (H) 136 (H) 216 (H)   Diabetes history: DM 2 Outpatient Diabetes medications:  Novolog 6-8 units tid with meals, Levemir 7 units bid Current orders for Inpatient glycemic control:  Novolog sensitive tid with meals and HS Levemir 7 units bid Inpatient Diabetes Program Recommendations:   Consider adding Novolog meal coverage 2 units tid with meals.   Thanks  Adah Perl, RN, BC-ADM Inpatient Diabetes Coordinator Pager [phone] (8a-5p)

## 2019-08-22 NOTE — Progress Notes (Signed)
  Echocardiogram 2D Echocardiogram has been performed.  Jennette Dubin 08/22/2019, 9:00 AM

## 2019-08-22 NOTE — Evaluation (Addendum)
Speech Language Pathology Evaluation Patient Details Name: [name] [name] MRN: [id number] DOB: January 22, 1934 Today's Date: 08/22/2019 Time: [id number] SLP Time Calculation (min) (ACUTE ONLY): 21 min  Problem List:  Patient Active Problem List   Diagnosis Date Noted  . Acute metabolic encephalopathy [id number]  . Right shoulder pain 08/21/2019  . Leukocytosis 08/21/2019  . Acute encephalopathy 08/18/2019  . Enterocolitis 08/18/2019  . Hyperglycemia 08/18/2019  . Chronic kidney disease   . Pressure injury of skin 12/09/2016  . SBO (small bowel obstruction) s/p lysis of adhesions 12/03/2016 12/04/2016  . Acute urinary retention 11/27/2016  . Ascites 11/27/2016  . Abdominal distension   . Facial droop   . Hyperkalemia 11/25/2016  . Sepsis ([place]) 11/25/2016  . Acute kidney injury superimposed on chronic kidney disease ([place]) 11/25/2016  . Delirium 11/25/2016  . Age-related osteoporosis without current pathological fracture 10/01/2016  . Hyperlipidemia 06/10/2016  . GERD (gastroesophageal reflux disease) 06/10/2016  . Insomnia 06/10/2016  . Viral gastroenteritis   . Right hip pain 05/06/2015  . Vaginal atrophy 11/01/2013  . ECZEMA 07/24/2008  . INTERMITTENT VERTIGO 07/24/2008  . Constipation 11/16/2007  . INSOMNIA 08/12/2007  . Essential hypertension 02/14/2007  . ALLERGIC RHINITIS 02/14/2007  . Hypothyroidism, acquired 01/26/2007  . Type 2 diabetes mellitus without complication, with long-term current use of insulin ([place]) 01/26/2007  . HYPERLIPIDEMIA 01/26/2007  . GERD 01/26/2007  . DEGENERATIVE JOINT DISEASE 01/26/2007   Past Medical History:  Past Medical History:  Diagnosis Date  . Arthritis    "knees, legs" (06/10/2016)  . Ascites   . Chronic kidney disease    "related to my diabetes"  . Chronic lower back pain   . Diabetes mellitus without complication ([place])   . Eczema   . GERD (gastroesophageal reflux disease)   . High cholesterol   . Hyperlipidemia   .  Hypertension   . Hypothyroidism   . OAB (overactive bladder)   . Osteoporosis   . Thyroid disease   . Type II diabetes mellitus ([place])   . Urticaria    Past Surgical History:  Past Surgical History:  Procedure Laterality Date  . ABDOMINAL HYSTERECTOMY    . KNEE ARTHROSCOPY    . LAPAROTOMY N/A 12/03/2016   Procedure: EXPLORATORY LAPAROTOMY, LYSIS OF ADHESIONS;  Surgeon: Armandina Gemma, MD;  Location: [facility];  Service: General;  Laterality: N/A;  . vocal cord polyps     HPI:  [name] [name] is a 86 y.o. female with medical history significant of hypertension, hyperlipidemia, diabetes mellitus type 2, hypothyroidism, chronic back pain, dementia, and GERD.  Presents after being found to be acutely altered this morning. Pt recently d/c from hospital due to enerocolitis, d/c 1/31 on antibiotics. Upon arriving at home pt was very fatigued. Dtr found her at sink feeling dizzy, sweating profusely, confused, and babbling. Pt wih R shld pain. R aided facial drop and slurred speech. CT negative.   Assessment / Plan / Recommendation Clinical Impression   Patient seen at bedside for ST cognitive-linguistic evaluation. Patient received in chair at bedside, daughter present and able to provide some history. Daughter reports patient has significant hearing loss, but has refused hearing aids. Daughter also reports patient lives alone, manages own medications, cooking, drives short distances (including occasionally to the grocery store). Family nearby and assists patient with things like yard care. Chart review reveals dementia listed in [place], however daughter does not believe patient has diagnosis of dementia, but does state she has some memory impairment at baseline.   Patient  alert, oriented x4.  Patient with expressive and receptive language skills grossly WFL, confrontation naming intact, automatic naming intact as well as repetition. Speech is clear without evidence of dysarthria. Pt denies word finding  difficulty. Patient able to answer basic and complex yes/no questions and follow 1-3 step verbal commands without difficulty. Patient able to answer complex multi-step commands with 75% accuracy.  Patient presents with mild cognitive-communication impairment in the areas of memory and problem solving. Patient able to answer accurately in immediate verbal recall task, but was able to name 0/3 items in delayed verbal recall task. She was able to recall 1/3 item with semantic category cueing and 1/3 items with choices presented. Pt with difficulty with calculations task, able to correctly answer 1/4 calculation questions. Daughter reports "math is not her strong suit."  Recommend ST follow-up at the acute level to address strategies and conduct further dx treatment for cognitive-linguistic skills. Further ST services at the OP or [place] level may also be beneficial.     SLP Assessment       Follow Up Recommendations  Home health SLP;Outpatient SLP;24 hour supervision/assistance    Frequency and Duration min 1 x/week  2 weeks      SLP Evaluation Cognition  Overall Cognitive Status: History of cognitive impairments - at baseline Arousal/Alertness: Awake/alert Orientation Level: Oriented X4 Attention: Focused Focused Attention: Appears intact Memory: Impaired Memory Impairment: Decreased short term memory Decreased Short Term Memory: Verbal basic Awareness: Appears intact Problem Solving: Impaired Problem Solving Impairment: Functional complex       Comprehension  Auditory Comprehension Overall Auditory Comprehension: Appears within functional limits for tasks assessed Yes/No Questions: Within Functional Limits Commands: Impaired One Step Basic Commands: 75-100% accurate Two Step Basic Commands: 75-100% accurate Complex Commands: 75-100% accurate Visual Recognition/Discrimination Discrimination: Not tested Reading Comprehension Reading Status: Not tested    Expression  Expression Primary Mode of Expression: Verbal Verbal Expression Overall Verbal Expression: Appears within functional limits for tasks assessed Initiation: No impairment Automatic Speech: Counting Level of Generative/Spontaneous Verbalization: Conversation Repetition: No impairment Naming: No impairment Non-Verbal Means of Communication: Not applicable Written Expression Dominant Hand: Right Written Expression: Not tested   Oral / Motor  Oral Motor/Sensory Function Overall Oral Motor/Sensory Function: Within functional limits Motor Speech Overall Motor Speech: Appears within functional limits for tasks assessed   [place], M.Ed., [facility] Acute Rehabilitation [phone]: Acute Rehab office [phone] - pager          Teneka Malmberg 08/22/2019, 4:39 PM

## 2019-08-22 NOTE — Evaluation (Signed)
Physical Therapy Evaluation Patient Details Name: [name] [name] MRN: [id number] DOB: 1934-03-13 Today's Date: 08/22/2019   History of Present Illness  [name] [name] is a 86 y.o. female with medical history significant of hypertension, hyperlipidemia, diabetes mellitus type 2, hypothyroidism, chronic back pain, dementia, and GERD.  Presents after being found to be acutely altered this morning. Pt recently d/c from hospital due to enerocolitis, d/c 1/31 on antibiotics. Upon arriving at home pt was very fatigued. Dtr found her at sink feeling dizzy, sweating profusely, confused, and babbling. Pt wih R shld pain. R aided facial drop and slurred speech. CT negative.    Clinical Impression  Pt admitted with above. Pt functioning at supervision more for impaired cognition and decreased safety awareness. Pt with no slurred speech, R gaze, or R facial drop today. Pt at baseline. DUe to impaired cognition pt continues to require 24/7 supervision. Acute PT to cont to follow to maintain function and strength while in hospital.    Follow Up Recommendations No PT follow up;Supervision/Assistance - 24 hour    Equipment Recommendations  None recommended by PT    Recommendations for Other Services       Precautions / Restrictions Precautions Precautions: Fall Precaution Comments: dementia Restrictions Weight Bearing Restrictions: No Other Position/Activity Restrictions: pt self limiting with R UE due to pain, xray negative for fracture      Mobility  Bed Mobility Overal bed mobility: Needs Assistance Bed Mobility: Supine to Sit;Sit to Supine     Supine to sit: Supervision Sit to supine: Supervision   General bed mobility comments: HOB elevated, no physical assist needed  Transfers Overall transfer level: Needs assistance Equipment used: None Transfers: Sit to/from Stand Sit to Stand: Min guard         General transfer comment: min guard for safety , no physical assist  needed  Ambulation/Gait Ambulation/Gait assistance: Min guard Gait Distance (Feet): 200 Feet Assistive device: None Gait Pattern/deviations: Step-through pattern;Decreased stride length Gait velocity: slow Gait velocity interpretation: 1.31 - 2.62 ft/sec, indicative of limited community ambulator General Gait Details: slow and guarded  Stairs Stairs: Yes Stairs assistance: Min guard Stair Management: One rail Right;Alternating pattern Number of Stairs: 4 General stair comments: pt with decreased safety awareness, decreased insight to lines despite max verbal cues   Wheelchair Mobility    Modified Rankin (Stroke Patients Only)       Balance Overall balance assessment: Needs assistance Sitting-balance support: No upper extremity supported;Feet supported Sitting balance-Leahy Scale: Good     Standing balance support: No upper extremity supported Standing balance-Leahy Scale: Fair Standing balance comment: pt able to maintain static standing without difficulty                             Pertinent Vitals/Pain Pain Assessment: Faces Pain Location: R shoulder    Home Living Family/patient expects to be discharged to:: Private residence Living Arrangements: Alone Available Help at Discharge: Family;Available 24 hours/day(daughters (2) ) Type of Home: House Home Access: Stairs to enter Entrance Stairs-Rails: None Entrance Stairs-Number of Steps: 3 Home Layout: One level Home Equipment: Walker - 2 wheels;Shower seat      Prior Function Level of Independence: Independent         Comments: reports independnet with mobility, ADLs     Hand Dominance   Dominant Hand: Right    Extremity/Trunk Assessment   Upper Extremity Assessment Upper Extremity Assessment: Defer to OT evaluation(limited R shld ROM  due to pain)    Lower Extremity Assessment Lower Extremity Assessment: Generalized weakness(appropriate for age)    Cervical / Trunk  Assessment Cervical / Trunk Assessment: Normal  Communication   Communication: HOH  Cognition Arousal/Alertness: Awake/alert Behavior During Therapy: WFL for tasks assessed/performed Overall Cognitive Status: History of cognitive impairments - at baseline                                 General Comments: pt with known dementia, poor historian but is orientedx4, follows commands consistently      General Comments General comments (skin integrity, edema, etc.): WFL, per RN pt with increased HR during amb    Exercises     Assessment/Plan    PT Assessment Patient needs continued PT services  PT Problem List Decreased strength;Decreased balance;Decreased mobility;Decreased cognition;Decreased safety awareness;Decreased knowledge of precautions;Decreased knowledge of use of DME       PT Treatment Interventions Stair training;Gait training;Therapeutic activities;Functional mobility training;Therapeutic exercise;Balance training;Patient/family education    PT Goals (Current goals can be found in the Care Plan section)  Acute Rehab PT Goals Patient Stated Goal: to go home PT Goal Formulation: With patient Time For Goal Achievement: 09/05/19 Potential to Achieve Goals: Good    Frequency Min 2X/week   Barriers to discharge        Co-evaluation               AM-PAC PT "6 Clicks" Mobility  Outcome Measure Help needed turning from your back to your side while in a flat bed without using bedrails?: None Help needed moving from lying on your back to sitting on the side of a flat bed without using bedrails?: None Help needed moving to and from a bed to a chair (including a wheelchair)?: A Little Help needed standing up from a chair using your arms (e.g., wheelchair or bedside chair)?: A Little Help needed to walk in hospital room?: A Little Help needed climbing 3-5 steps with a railing? : A Little 6 Click Score: 20    End of Session Equipment Utilized During  Treatment: Gait belt Activity Tolerance: Patient tolerated treatment well Patient left: in bed;with call bell/phone within reach;with bed alarm set(returned to bed for [place]) Nurse Communication: Mobility status PT Visit Diagnosis: Muscle weakness (generalized) (M62.81);Unsteadiness on feet (R26.81)    Time: [id number] PT Time Calculation (min) (ACUTE ONLY): 12 min   Charges:   PT Evaluation $PT Eval Low Complexity: 1 Low          Kittie Plater, PT, DPT Acute Rehabilitation Services Pager #: [phone] Office #: [phone]   Berline Lopes 08/22/2019, 9:29 AM

## 2019-08-22 NOTE — Progress Notes (Signed)
STROKE TEAM PROGRESS NOTE   INTERVAL HISTORY Patient up in the chair at the bedside. Daughter present at bedside as well. Patient recounted HPI - R arm weak yesterday. Patient was making coffee then yelled "whoa". When daughter got to her she noted slurred speech and R sided weakness. They called EMS. Upon their arrival, she passed out when she got on the gurney, once outside had wakened. She had a period of diaphoresis as well. She had a recent admission for enterocolitis. . She feels she has recovered except right shoulder weakness which is at baseline from chronic shoulder problems  Vitals:   08/21/19 1751 08/21/19 2004 08/22/19 0412 08/22/19 0930  BP: (!) 157/61 (!) 153/64 (!) 147/61 (!) 136/58  Pulse: 86 71 (!) 59 62  Resp: 18 16 16 18   Temp: (!) 100.4 F (38 C) 100.2 F (37.9 C) 98.7 F (37.1 C) 98.9 F (37.2 C)  TempSrc: Oral Oral Oral Oral  SpO2: 99% 100% 100% 100%  Weight:   63.6 kg     PHYSICAL EXAM [name] not in distress.  Right shoulder elevation is limited due to mechanical pain. . Afebrile. Head is nontraumatic. Neck is supple without bruit.    Cardiac exam no murmur or gallop. Lungs are clear to auscultation. Distal pulses are well felt. Neurological Exam ;  Awake  Alert oriented x 3. Normal speech and language.eye movements full without nystagmus.fundi were not visualized. Vision acuity and fields appear normal. Hearing is normal. Palatal movements are normal. Face symmetric. Tongue midline. Normal strength, tone, reflexes and coordination.  Except right shoulder elevation limited due to mechanical pain.  Normal sensation. Gait deferred.  ASSESSMENT/PLAN [name] is a 86 y.o. female with history of diabetes, thyroid disease, hypertension, hyperlipidemia, chronic kidney disease presenting with R arm weakness, with diaphoretic episode and passed out.   L brain TIA  CT head No acute abnormality. Small vessel disease. Atrophy.   MRI   No acute stroke, Small vessel disease. Atrophy.   MRA  Unremarkable   Carotid Doppler  B ICA 1-39% stenosis, VAs antegrade   2D Echo EF 60-65-%. No source of embolus   EEG normal, no sz   LDL pending    HgbA1c 8.6  Lovenox 30 mg sq daily for VTE prophylaxis  aspirin 81 mg daily prior to admission, now on aspirin 325 mg daily. Will add plavix and decrease aspirin to 81. Continue DAPT x 3 weeks then PLAVIX alone   Highly suspicious for atrial fibrillation as source of stroke. Recommend monitoring  to look for AF as possible source of TIA. Dr. Leonie Man spoke with Dr. Eliseo Squires who will consult EP for loop vs 30d monitor      Therapy recommendations:  OP OT, no PT  Disposition:  pending (patient lives alone PTA, dtrs live nearby and check on frequently)  Hypertension Possible orthostatic hypotension  Stable . BP goal normotensive  Hyperlipidemia  Home meds:  lipitor 40, fish oil, lipitor resumed in hospital  LDL pending, goal < 70  Continue statin at discharge  Diabetes type II Uncontrolled  HgbA1c 8.6, goal < 7.0 Recent Labs    08/21/19 1752 08/21/19 2127 08/22/19 0657  GLUCAP 398* 338* 71*    DB RN following  Other Stroke Risk Factors  Advanced age  Former smokeless tobacco user, quit 36 yrs ago  Other Active Problems  Acute leukocytosis  Enterocolitis  AKI on CKD 1.3->1.6  Normocytic anemia  Hypothyroidism   GERD  Leukocytosis  9.1->11.3   Hospital day # 0  I have personally obtained history,examined this patient, reviewed notes, independently viewed imaging studies, participated in medical decision making and plan of care.ROS completed by me personally and pertinent positives fully documented  I have made any additions or clarifications directly to the above note.  She presented with generalized weakness and some slurred speech possibly from posterior circulation TIA but also had a episode of brief syncopal event which raises concern for underlying  cardiac arrhythmia and recommend outpatient cardiac monitoring for A. fib.  Continue ongoing stroke work-up and antiplatelet therapy and aggressive risk factor modification.  Long discussion with patient and daughter and answered questions.  Discussed with Dr. Eliseo Squires.  Greater than 50% time during this 35-minute visit was spent on counseling and coordination of care and discussion with care team and planning evaluation and treatment  Antony Contras, MD Medical Director [place] Pager: [phone] 08/22/2019 4:09 PM   To contact Stroke Continuity provider, please refer to [email or web address] After hours, contact General Neurology

## 2019-08-22 NOTE — Evaluation (Signed)
Occupational Therapy Evaluation Patient Details Name: [name] [name] MRN: [id number] DOB: 08/30/1933 Today's Date: 08/22/2019    History of Present Illness [name] [name] is a 86 y.o. female with medical history significant of hypertension, hyperlipidemia, diabetes mellitus type 2, hypothyroidism, chronic back pain, dementia, and GERD.  Presents after being found to be acutely altered this morning. Pt recently d/c from hospital due to enerocolitis, d/c 1/31 on antibiotics. Upon arriving at home pt was very fatigued. Dtr found her at sink feeling dizzy, sweating profusely, confused, and babbling. Pt wih R shld pain. R aided facial drop and slurred speech. CT negative.   Clinical Impression   PTA patient reports independent with ADLs, mobility; has 24/7 support from daughters.  She was admitted for above and is limited by problem list below, including R shoulder pain and limited functional ROM, impaired balance, generalized weakness and decreased activity tolerance.  Known baseline hx of dementia. Patient currently requires min assist to supervision for ADLs, min guard for transfers and in room mobility (no AD).  Patient requiring increased assist from baseline for ADls due to R shoulder pain and limited functional use.  Will follow acutely to optimize independence, safety, and ROM of R UE, and believe she will best benefit from OP OT services at discharge.      Follow Up Recommendations  Outpatient OT;Supervision/Assistance - 24 hour    Equipment Recommendations  None recommended by OT    Recommendations for Other Services       Precautions / Restrictions Precautions Precautions: Fall Precaution Comments: dementia Restrictions Weight Bearing Restrictions: No Other Position/Activity Restrictions: pt self limiting with R UE due to pain, xray negative for fracture      Mobility Bed Mobility Overal bed mobility: Needs Assistance Bed Mobility: Supine to Sit;Sit to Supine     Supine to  sit: Supervision     General bed mobility comments: HOB elevated, increased time but no physical assist required  Transfers Overall transfer level: Needs assistance Equipment used: None Transfers: Sit to/from Stand Sit to Stand: Min guard         General transfer comment: min guard for safety , no physical assist needed    Balance Overall balance assessment: Needs assistance Sitting-balance support: No upper extremity supported;Feet supported Sitting balance-Leahy Scale: Good     Standing balance support: No upper extremity supported Standing balance-Leahy Scale: Fair Standing balance comment: pt able to maintain static standing without difficulty                           ADL either performed or assessed with clinical judgement   ADL Overall ADL's : Needs assistance/impaired     Grooming: Supervision/safety;Standing   Upper Body Bathing: Sitting;Minimal assistance   Lower Body Bathing: Min guard;Sit to/from stand   Upper Body Dressing : Minimal assistance;Sitting   Lower Body Dressing: Min guard;Sit to/from stand   Toilet Transfer: Min guard;Ambulation;Regular Toilet;Grab bars   Toileting- Clothing Manipulation and Hygiene: Sit to/from stand;Minimal assistance Toileting - Clothing Manipulation Details (indicate cue type and reason): clothing mgmt      Functional mobility during ADLs: Min guard General ADL Comments: patient limited by R shoulder pain, mild balance deficits and generalized weakness      Vision   Vision Assessment?: No apparent visual deficits     Perception     Praxis      Pertinent Vitals/Pain Pain Assessment: Faces Faces Pain Scale: Hurts little more Pain Location: R  shoulder Pain Descriptors / Indicators: Discomfort;Guarding Pain Intervention(s): Monitored during session;Repositioned;Limited activity within patient's tolerance     Hand Dominance Right   Extremity/Trunk Assessment Upper Extremity Assessment Upper  Extremity Assessment: RUE deficits/detail RUE Deficits / Details: limited by R shoulder pain, AROM FF to 10* but PROM FF to 60*; otherwise WFL  RUE: Unable to fully assess due to pain RUE Sensation: decreased light touch RUE Coordination: decreased gross motor   Lower Extremity Assessment Lower Extremity Assessment: Defer to PT evaluation   Cervical / Trunk Assessment Cervical / Trunk Assessment: Normal   Communication Communication Communication: HOH   Cognition Arousal/Alertness: Awake/alert Behavior During Therapy: WFL for tasks assessed/performed Overall Cognitive Status: History of cognitive impairments - at baseline                                 General Comments: pt with known dementia, poor historian but is orientedx4, follows commands consistently   General Comments       Exercises     Shoulder Instructions      Home Living Family/patient expects to be discharged to:: Private residence Living Arrangements: Alone Available Help at Discharge: Family;Available 24 hours/day Type of Home: House Home Access: Stairs to enter [employer] of Steps: 3 Entrance Stairs-Rails: None Home Layout: One level     Bathroom Shower/Tub: Teacher, early years/pre: Standard     Home Equipment: Environmental consultant - 2 wheels;Shower seat          Prior Functioning/Environment Level of Independence: Independent        Comments: reports independnet with mobility, ADLs        OT Problem List: Decreased strength;Decreased activity tolerance;Decreased range of motion;Impaired balance (sitting and/or standing);Decreased coordination;Decreased cognition;Decreased safety awareness;Decreased knowledge of use of DME or AE;Decreased knowledge of precautions;Pain;Impaired UE functional use      OT Treatment/Interventions: DME and/or AE instruction;Self-care/ADL training;Therapeutic exercise;Therapeutic activities;Balance training;Patient/family education     OT Goals(Current goals can be found in the care plan section) Acute Rehab OT Goals Patient Stated Goal: to go home OT Goal Formulation: With patient Time For Goal Achievement: 09/05/19 Potential to Achieve Goals: Good  OT Frequency: Min 2X/week   Barriers to D/C:            Co-evaluation              AM-PAC OT "6 Clicks" Daily Activity     Outcome Measure Help from another person eating meals?: A Little Help from another person taking care of personal grooming?: A Little Help from another person toileting, which includes using toliet, bedpan, or urinal?: A Little Help from another person bathing (including washing, rinsing, drying)?: A Little Help from another person to put on and taking off regular upper body clothing?: A Little Help from another person to put on and taking off regular lower body clothing?: A Little 6 Click Score: 18   End of Session Nurse Communication: Mobility status  Activity Tolerance: Patient tolerated treatment well Patient left: Other (comment)(with PT )  OT Visit Diagnosis: Other abnormalities of gait and mobility (R26.89);Muscle weakness (generalized) (M62.81);Pain Pain - Right/Left: Right Pain - part of body: Shoulder                Time: [id number] OT Time Calculation (min): 15 min Charges:  OT General Charges $OT Visit: 1 Visit OT Evaluation $OT Eval Low Complexity: 1 Low  Jolaine Artist, OT Acute Rehabilitation Services  Pager [phone] Office [place] 08/22/2019, 12:09 PM

## 2019-08-22 NOTE — Progress Notes (Signed)
Carotid duplex has been completed.   Preliminary results in CV Proc.   Abram Sander 08/22/2019 10:45 AM

## 2019-08-22 NOTE — Progress Notes (Signed)
Progress Note    [name] [name]  [id number] DOB: October 02, 1933  DOA: 08/21/2019 PCP: Lajean Manes, MD    Brief Narrative:    Medical records reviewed and are as summarized below:  [name] [name] is an 86 y.o. female with medical history significant of hypertension, hyperlipidemia, diabetes mellitus type 2, hypothyroidism, chronic back pain, dementia, and GERD.  Presents after being found to be acutely altered this morning.  Last known normal around 8 PM last night and while helping her to the bathroom earlier this morning around 2:30 AM.  Her daughter gives most of her history as the patient has some dementia.  Patient had just recently been discharged from the hospital yesterday after initially presenting with altered mental status.  Found to have enterocolitis for which patient was prescribed antibiotics of ciprofloxacin and metronidazole which she had 3 more days to complete her course.  Patient was doing well when she left the hospital The next morning she was found standing by the sink.  She seemed as if she were going to collapse and kept stating that her legs were weak.  Her daughter was able to sit her down so that she did not fall.  The patient was complaining of feeling dizzy, sweating profusely, confused, and babbling.  Patient was placed on the gurney and had a syncopal episode per family.  EMS run sheet was unavailable and media but daughter reports blood sugar was in the 170s.  Assessment/Plan:   Principal Problem:   Acute metabolic encephalopathy Active Problems:   Hypothyroidism, acquired   Type 2 diabetes mellitus without complication, with long-term current use of insulin (HCC)   Acute kidney injury superimposed on chronic kidney disease (HCC)   Enterocolitis   Right shoulder pain   Leukocytosis    Episode of unresponsiveness -Continue telemetry -MRI/MRA of the brain without CVA -EEG within normal limits -echocardiogram and carotid ultrasound  unremarkable -Neurology consult appreciated: Spoke with Dr. Leonie Man who would recommend a cardiology consult as he believes her spell was due to a cardiac arrhythmia.  He would like their opinion on a 30-day event monitor versus a loop recorder -Check orthostatics  Leukocytosis:  -Recheck in a.m.  Enterocolitis: Prior to arrival.  Diagnosed during patient's last hospitalization.  Daughter notes patient had 3 days left of metronidazole and ciprofloxacin.   -IV zosyn -had done well on cipro/flagyl during last hospitalization  Acute kidney injury superimposed on chronic kidney disease stage 3a:  -Patient presents with creatinine elevated up to 1.63 with BUN 21.   -Baseline creatinine previously noted to be around 1.3 yesterday. -Normal saline IV fluids at 75 mL/h -Recheck creatinine in a.m.  Diabetes mellitus type 2 uncontrolled: Patient's last hemoglobin A1c noted to be 8.9 on 08/18/2019.  Currently takes Levemir 7 units twice daily and 6-8 units insulin with meals. -Hypoglycemic protocol -Continue current Levemir regimen -CBGs with sensitive SSI  Right shoulder pain:  -x-ray of the right shoulder did not show dislocation or fracture -We will do trial of Voltaren gel to see if discomfort improves -Perhaps will need to follow with orthopedics for an injection as it appears to be a possible frozen shoulder  Essential hypertension: Blood pressures currently stable.  Home blood pressure medications include losartan. -Restart losartan when medically appropriate  Normocytic anemia: Hemoglobin 10.4 which appears acutely lower from previous of 12.1 on 1/28. -Continue to monitor  Hyperlipidemia -Continue Lipitor  Hypothyroidism: TSH 8.334 and free T4 1.23 on 08/17/2019. -Continue Synthroid  GERD -Continue Protonix  Family Communication/Anticipated D/C date and plan/Code Status   DVT prophylaxis: Lovenox ordered. Code Status: Full Code.  Family Communication: Daughter at  bedside Disposition Plan: Home pending improvement of creatinine and cardiology consult   Medical Consultants:    Neurology  Cardiology     Subjective:   Patient is complaining of right shoulder pain down into her right elbow  Objective:    Vitals:   08/21/19 1751 08/21/19 2004 08/22/19 0412 08/22/19 0930  BP: (!) 157/61 (!) 153/64 (!) 147/61 (!) 136/58  Pulse: 86 71 (!) 59 62  Resp: 18 16 16 18   Temp: (!) 100.4 F (38 C) 100.2 F (37.9 C) 98.7 F (37.1 C) 98.9 F (37.2 C)  TempSrc: Oral Oral Oral Oral  SpO2: 99% 100% 100% 100%  Weight:   63.6 kg     Intake/Output Summary (Last 24 hours) at 08/22/2019 1158 Last data filed at 08/22/2019 1055 Gross per 24 hour  Intake 1748.28 ml  Output 400 ml  Net 1348.28 ml   Filed Weights   08/22/19 0412  Weight: 63.6 kg    Exam: Sitting in chair, no acute distress Pleasant cooperative Right shoulder is tender to palpation and hurts to move: No sign of swelling or cellulitis Regular rate and rhythm No increased work of breathing, not on O2  Data Reviewed:   I have personally reviewed following labs and imaging studies:  Labs: Labs show the following:   Basic Metabolic Panel: Recent Labs  Lab 08/17/19 2247 08/17/19 2247 08/18/19 0826 08/18/19 0826 08/19/19 0422 08/19/19 0422 08/20/19 0659 08/21/19 0750  NA 131*  --  131*  --  134*  --  136 135  K 4.4   < > 4.4   < > 3.6   < > 4.0 4.0  CL 92*  --  94*  --  98  --  100 98  CO2 22  --  23  --  28  --  25 25  GLUCOSE 454*  --  457*  --  77  --  116* 212*  BUN 22  --  21  --  17  --  14 21  CREATININE 1.29*  --  1.41*  --  1.28*  --  1.30* 1.63*  CALCIUM 10.3  --  9.8  --  9.1  --  9.5 9.4   < > = values in this interval not displayed.   GFR Estimated Creatinine Clearance: 24 mL/min (A) (by C-G formula based on SCr of 1.63 mg/dL (H)). Liver Function Tests: Recent Labs  Lab 08/17/19 2247 08/21/19 0750  AST 35 32  ALT 32 25  ALKPHOS 85 71  BILITOT 0.7  0.7  PROT 8.0 6.5  ALBUMIN 4.1 3.1*   No results for input(s): LIPASE, AMYLASE in the last 168 hours. Recent Labs  Lab 08/18/19 0828  AMMONIA 27   Coagulation profile Recent Labs  Lab 08/17/19 2314 08/21/19 0750  INR 0.9 1.1    CBC: Recent Labs  Lab 08/17/19 2247 08/21/19 0750  WBC 9.1 11.3*  NEUTROABS 8.2*  --   HGB 12.1 10.4*  HCT 36.5 31.5*  MCV 96.6 97.2  PLT 242 230   Cardiac Enzymes: No results for input(s): CKTOTAL, CKMB, CKMBINDEX, TROPONINI in the last 168 hours. BNP (last 3 results) No results for input(s): PROBNP in the last 8760 hours. CBG: Recent Labs  Lab 08/21/19 1717 08/21/19 1752 08/21/19 2127 08/22/19 0657 08/22/19 1127  GLUCAP 366* 398* 338* 136* 216*   D-Dimer: No  results for input(s): DDIMER in the last 72 hours. Hgb A1c: No results for input(s): HGBA1C in the last 72 hours. Lipid Profile: No results for input(s): CHOL, HDL, LDLCALC, TRIG, CHOLHDL, LDLDIRECT in the last 72 hours. Thyroid function studies: No results for input(s): TSH, T4TOTAL, T3FREE, THYROIDAB in the last 72 hours.  Invalid input(s): FREET3 Anemia work up: No results for input(s): VITAMINB12, FOLATE, FERRITIN, TIBC, IRON, RETICCTPCT in the last 72 hours. Sepsis Labs: Recent Labs  Lab 08/17/19 2247 08/18/19 0828 08/21/19 0750  WBC 9.1  --  11.3*  LATICACIDVEN  --  1.3  --     Microbiology Recent Results (from the past 240 hour(s))  Blood culture (routine x 2)     Status: None (Preliminary result)   Collection Time: 08/17/19 12:36 AM   Specimen: BLOOD LEFT HAND  Result Value Ref Range Status   Specimen Description BLOOD LEFT HAND  Final   Special Requests   Final    BOTTLES DRAWN AEROBIC AND ANAEROBIC Blood Culture results may not be optimal due to an inadequate volume of blood received in culture bottles Performed at [facility], [place] [address] [place], [place] [address]    Culture NO GROWTH 4 DAYS  Final   Report Status PENDING  Incomplete   Blood culture (routine x 2)     Status: None (Preliminary result)   Collection Time: 08/17/19 12:37 AM   Specimen: BLOOD  Result Value Ref Range Status   Specimen Description BLOOD RIGHT ANTECUBITAL  Final   Special Requests   Final    BOTTLES DRAWN AEROBIC AND ANAEROBIC Blood Culture results may not be optimal due to an inadequate volume of blood received in culture bottles Performed at [facility] [address], [place], [place] [address]    Culture NO GROWTH 4 DAYS  Final   Report Status PENDING  Incomplete  Respiratory Panel by RT PCR (Flu A&B, Covid) - Nasopharyngeal Swab     Status: None   Collection Time: 08/17/19 11:15 PM   Specimen: Nasopharyngeal Swab  Result Value Ref Range Status   SARS Coronavirus 2 by RT PCR NEGATIVE NEGATIVE Final    Comment: (NOTE) SARS-CoV-2 target nucleic acids are NOT DETECTED. The SARS-CoV-2 RNA is generally detectable in upper respiratoy specimens during the acute phase of infection. The lowest concentration of SARS-CoV-2 viral copies this assay can detect is 131 copies/mL. A negative result does not preclude SARS-Cov-2 infection and should not be used as the sole basis for treatment or other patient management decisions. A negative result may occur with  improper specimen collection/handling, submission of specimen other than nasopharyngeal swab, presence of viral mutation(s) within the areas targeted by this assay, and inadequate number of viral copies (<131 copies/mL). A negative result must be combined with clinical observations, patient history, and epidemiological information. The expected result is Negative. Fact Sheet for Patients:  PinkCheek.be Fact Sheet for Healthcare Providers:  GravelBags.it This test is not yet ap proved or cleared by the [place] FDA and  has been authorized for detection and/or diagnosis of SARS-CoV-2 by FDA under an Emergency Use  Authorization (EUA). This EUA will remain  in effect (meaning this test can be used) for the duration of the COVID-19 declaration under Section 564(b)(1) of the Act, 21 U.S.C. section 360bbb-3(b)(1), unless the authorization is terminated or revoked sooner.    Influenza A by PCR NEGATIVE NEGATIVE Final   Influenza B by PCR NEGATIVE NEGATIVE Final    Comment: (  NOTE) The Xpert Xpress SARS-CoV-2/FLU/RSV assay is intended as an aid in  the diagnosis of influenza from Nasopharyngeal swab specimens and  should not be used as a sole basis for treatment. Nasal washings and  aspirates are unacceptable for Xpert Xpress SARS-CoV-2/FLU/RSV  testing. Fact Sheet for Patients: PinkCheek.be Fact Sheet for Healthcare Providers: GravelBags.it This test is not yet approved or cleared by the [place] FDA and  has been authorized for detection and/or diagnosis of SARS-CoV-2 by  FDA under an Emergency Use Authorization (EUA). This EUA will remain  in effect (meaning this test can be used) for the duration of the  Covid-19 declaration under Section 564(b)(1) of the Act, 21  U.S.C. section 360bbb-3(b)(1), unless the authorization is  terminated or revoked. Performed at [facility], [place] [address] [place], [place] [address]   Urine culture     Status: None   Collection Time: 08/17/19 11:20 PM   Specimen: Urine, Clean Catch  Result Value Ref Range Status   Specimen Description URINE, CLEAN CATCH  Final   Special Requests NONE  Final   Culture   Final    NO GROWTH Performed at [facility], [place] [address], [place], [place] [address]    Report Status 08/18/2019 FINAL  Final  SARS CORONAVIRUS 2 (TAT 6-24 HRS) Nasopharyngeal Nasopharyngeal Swab     Status: None   Collection Time: 08/21/19 10:30 AM   Specimen: Nasopharyngeal Swab  Result Value Ref Range Status   SARS Coronavirus 2 NEGATIVE NEGATIVE Final    Comment:  (NOTE) SARS-CoV-2 target nucleic acids are NOT DETECTED. The SARS-CoV-2 RNA is generally detectable in upper and lower respiratory specimens during the acute phase of infection. Negative results do not preclude SARS-CoV-2 infection, do not rule out co-infections with other pathogens, and should not be used as the sole basis for treatment or other patient management decisions. Negative results must be combined with clinical observations, patient history, and epidemiological information. The expected result is Negative. Fact Sheet for Patients: SugarRoll.be Fact Sheet for Healthcare Providers: https://www.woods-mathews.com/ This test is not yet approved or cleared by the [place] FDA and  has been authorized for detection and/or diagnosis of SARS-CoV-2 by FDA under an Emergency Use Authorization (EUA). This EUA will remain  in effect (meaning this test can be used) for the duration of the COVID-19 declaration under Section 56 4(b)(1) of the Act, 21 U.S.C. section 360bbb-3(b)(1), unless the authorization is terminated or revoked sooner. Performed at [facility], [place] [address], [place], [place] [address]   Culture, blood (routine x 2)     Status: None (Preliminary result)   Collection Time: 08/21/19  8:23 PM   Specimen: BLOOD  Result Value Ref Range Status   Specimen Description BLOOD LEFT HAND  Final   Special Requests   Final    BOTTLES DRAWN AEROBIC ONLY Blood Culture results may not be optimal due to an inadequate volume of blood received in culture bottles Performed at [place] [address] [place], [place] [address]    Culture NO GROWTH < 12 HOURS  Final   Report Status PENDING  Incomplete  Culture, blood (routine x 2)     Status: None (Preliminary result)   Collection Time: 08/21/19  8:30 PM   Specimen: BLOOD  Result Value Ref Range Status   Specimen Description BLOOD LEFT HAND  Final   Special Requests    Final    BOTTLES DRAWN AEROBIC ONLY Blood  Culture adequate volume Performed at [facility], [place] [address] [place], [place] [address]    Culture NO GROWTH < 12 HOURS  Final   Report Status PENDING  Incomplete    Procedures and diagnostic studies:  EEG  Result Date: 08/21/2019 Lora Havens, MD     08/21/2019  4:17 PM Patient Name: VITALINA NASTASI MRN: [id number] Epilepsy Attending: Lora Havens Referring Physician/Provider: Dr. Fuller Plan Date: 08/21/2019 Duration: 23.28 minutes Patient history:  86 year-old female who presented with altered mental status, garbled speech as well as right gaze preference and right facial droop transiently.  EEG to evaluate for seizure. Level of alertness: Awake, asleep AEDs during EEG study: None Technical aspects: This EEG study was done with scalp electrodes positioned according to the 10-20 International system of electrode placement. Electrical activity was acquired at a sampling rate of 500Hz  and reviewed with a high frequency filter of 70Hz  and a low frequency filter of 1Hz . EEG data were recorded continuously and digitally stored. Description:  The posterior dominant rhythm consists of 8 Hz activity of moderate voltage (25-35 uV) seen predominantly in posterior head regions, symmetric and reactive to eye opening and eye closing.  Sleep was characterized by intermittent rhythmic 2-4Hz  theta- delta slowing. Hyperventilation and photic stimulation were not performed. IMPRESSION: This study is within normal limits. No seizures or epileptiform discharges were seen throughout the recording. Lora Havens   DG Shoulder Right  Result Date: 08/21/2019 CLINICAL DATA:  Right shoulder pain. EXAM: RIGHT SHOULDER - 2+ VIEW COMPARISON:  None. FINDINGS: There is no acute displaced fracture or dislocation. Mild degenerative changes are noted of the glenohumeral joint. There is calcific tendinosis of the supraspinatus. Osteopenia is noted. IMPRESSION: 1. No acute  displaced fracture or dislocation. 2. Calcific tendinosis of the supraspinatus. Electronically Signed   By: Constance Holster M.D.   On: 08/21/2019 20:01   CT HEAD WO CONTRAST  Result Date: 08/21/2019 CLINICAL DATA:  Transit ischemic attack. RIGHT-sided deficit. EXAM: CT HEAD WITHOUT CONTRAST TECHNIQUE: Contiguous axial images were obtained from the base of the skull through the vertex without intravenous contrast. COMPARISON:  None. FINDINGS: Brain: No acute intracranial hemorrhage. No focal mass lesion. No CT evidence of acute infarction. No midline shift or mass effect. No hydrocephalus. Basilar cisterns are patent. There are periventricular and subcortical white matter hypodensities. Generalized cortical atrophy. Vascular: No hyperdense vessel or unexpected calcification. Skull: Normal. Negative for fracture or focal lesion. Sinuses/Orbits: Paranasal sinuses and mastoid air cells are clear. Orbits are clear. Other: None. IMPRESSION: 1. No acute intracranial findings.  No change from prior. 2. Atrophy and white matter microvascular disease. Electronically Signed   By: Suzy Bouchard M.D.   On: 08/21/2019 08:26   MR ANGIO HEAD WO CONTRAST  Result Date: 08/21/2019 CLINICAL DATA:  Slurred speech and lower extremity weakness EXAM: MRI HEAD WITHOUT CONTRAST MRA HEAD WITHOUT CONTRAST TECHNIQUE: Multiplanar, multiecho pulse sequences of the brain and surrounding structures were obtained without intravenous contrast. Angiographic images of the head were obtained using MRA technique without contrast. COMPARISON:  None. FINDINGS: MRI HEAD FINDINGS BRAIN: No acute infarct, acute hemorrhage or extra-axial collection. Multifocal white matter hyperintensity, most commonly due to chronic ischemic microangiopathy. There is generalized atrophy without lobar predilection. Midline structures are normal. VASCULAR: Major flow voids are preserved. Susceptibility-sensitive sequences show no chronic microhemorrhage or  superficial siderosis. SKULL AND UPPER CERVICAL SPINE: Normal calvarium and skull base. Visualized upper cervical spine and soft tissues are normal.  SINUSES/ORBITS: No fluid levels or advanced mucosal thickening. No mastoid or middle ear effusion. Normal orbits. MRA HEAD FINDINGS POSTERIOR CIRCULATION: --Vertebral arteries: Normal V4 segments. --Posterior inferior cerebellar arteries (PICA): Patent origins from the vertebral arteries. --Anterior inferior cerebellar arteries (AICA): Patent origins from the basilar artery. --Basilar artery: Normal. --Superior cerebellar arteries: Normal. --Posterior cerebral arteries: Normal. The right PCA is partially supplied by a posterior communicating artery (p-comm). ANTERIOR CIRCULATION: --Intracranial internal carotid arteries: Normal. --Anterior cerebral arteries (ACA): Normal. Both A1 segments are present. Patent anterior communicating artery (a-comm). --Middle cerebral arteries (MCA): Normal. IMPRESSION: 1. No acute intracranial process. 2. Generalized atrophy and chronic ischemic microangiopathy. 3. Normal intracranial MRA. Electronically Signed   By: Ulyses Jarred M.D.   On: 08/21/2019 20:13   MR BRAIN WO CONTRAST  Result Date: 08/21/2019 CLINICAL DATA:  Slurred speech and lower extremity weakness EXAM: MRI HEAD WITHOUT CONTRAST MRA HEAD WITHOUT CONTRAST TECHNIQUE: Multiplanar, multiecho pulse sequences of the brain and surrounding structures were obtained without intravenous contrast. Angiographic images of the head were obtained using MRA technique without contrast. COMPARISON:  None. FINDINGS: MRI HEAD FINDINGS BRAIN: No acute infarct, acute hemorrhage or extra-axial collection. Multifocal white matter hyperintensity, most commonly due to chronic ischemic microangiopathy. There is generalized atrophy without lobar predilection. Midline structures are normal. VASCULAR: Major flow voids are preserved. Susceptibility-sensitive sequences show no chronic microhemorrhage  or superficial siderosis. SKULL AND UPPER CERVICAL SPINE: Normal calvarium and skull base. Visualized upper cervical spine and soft tissues are normal. SINUSES/ORBITS: No fluid levels or advanced mucosal thickening. No mastoid or middle ear effusion. Normal orbits. MRA HEAD FINDINGS POSTERIOR CIRCULATION: --Vertebral arteries: Normal V4 segments. --Posterior inferior cerebellar arteries (PICA): Patent origins from the vertebral arteries. --Anterior inferior cerebellar arteries (AICA): Patent origins from the basilar artery. --Basilar artery: Normal. --Superior cerebellar arteries: Normal. --Posterior cerebral arteries: Normal. The right PCA is partially supplied by a posterior communicating artery (p-comm). ANTERIOR CIRCULATION: --Intracranial internal carotid arteries: Normal. --Anterior cerebral arteries (ACA): Normal. Both A1 segments are present. Patent anterior communicating artery (a-comm). --Middle cerebral arteries (MCA): Normal. IMPRESSION: 1. No acute intracranial process. 2. Generalized atrophy and chronic ischemic microangiopathy. 3. Normal intracranial MRA. Electronically Signed   By: Ulyses Jarred M.D.   On: 08/21/2019 20:13   ECHOCARDIOGRAM COMPLETE  Result Date: 08/22/2019   ECHOCARDIOGRAM REPORT   Patient Name:   MATSUE PICCO Date of Exam: 08/22/2019 Medical Rec #:  [id number]       Height:       65.5 in Accession #:    [id number]      Weight:       140.2 lb Date of Birth:  August 26, 1933       BSA:          1.71 m Patient Age:    14 years        BP:           147/61 mmHg Patient Gender: F               HR:           59 bpm. Exam Location:  Inpatient Procedure: 2D Echo Indications:    TIA G45.9  History:        Patient has prior history of Echocardiogram examinations, most                 recent 11/29/2016. Risk Factors:Hypertension, Diabetes and  Dyslipidemia.  Sonographer:    Mikki Santee RDCS (AE) Referring Phys: [id number] RONDELL A SMITH IMPRESSIONS  1. Left ventricular ejection  fraction, by visual estimation, is 60 to 65%. The left ventricle has normal function. Left ventricular septal wall thickness was mildly increased. Mildly increased left ventricular posterior wall thickness. There is no left ventricular hypertrophy.  2. Elevated left ventricular end-diastolic pressure.  3. Left ventricular diastolic parameters are indeterminate.  4. The left ventricle has no regional wall motion abnormalities.  5. Global right ventricle has normal systolic function.The right ventricular size is normal. No increase in right ventricular wall thickness.  6. Left atrial size was normal.  7. Right atrial size was normal.  8. The mitral valve is normal in structure. Trivial mitral valve regurgitation. No evidence of mitral stenosis.  9. The tricuspid valve is normal in structure. Tricuspid valve regurgitation is mild. 10. The aortic valve is normal in structure. Aortic valve regurgitation is not visualized. No evidence of aortic valve sclerosis or stenosis. 11. The pulmonic valve was normal in structure. Pulmonic valve regurgitation is not visualized. 12. Normal pulmonary artery systolic pressure. 13. The inferior vena cava is dilated in size with >50% respiratory variability, suggesting right atrial pressure of 8 mmHg. FINDINGS  Left Ventricle: Left ventricular ejection fraction, by visual estimation, is 60 to 65%. The left ventricle has normal function. The left ventricle has no regional wall motion abnormalities. Mildly increased left ventricular posterior wall thickness. There is no left ventricular hypertrophy. Left ventricular diastolic parameters are indeterminate. Elevated left ventricular end-diastolic pressure. Right Ventricle: The right ventricular size is normal. No increase in right ventricular wall thickness. Global RV systolic function is has normal systolic function. The tricuspid regurgitant velocity is 2.42 m/s, and with an assumed right atrial pressure  of 8 mmHg, the estimated right  ventricular systolic pressure is normal at 31.4 mmHg. Left Atrium: Left atrial size was normal in size. Right Atrium: Right atrial size was normal in size Pericardium: There is no evidence of pericardial effusion. Mitral Valve: The mitral valve is normal in structure. Trivial mitral valve regurgitation. No evidence of mitral valve stenosis by observation. Tricuspid Valve: The tricuspid valve is normal in structure. Tricuspid valve regurgitation is mild. Aortic Valve: The aortic valve is normal in structure. Aortic valve regurgitation is not visualized. The aortic valve is structurally normal, with no evidence of sclerosis or stenosis. Pulmonic Valve: The pulmonic valve was normal in structure. Pulmonic valve regurgitation is not visualized. Pulmonic regurgitation is not visualized. Aorta: The aortic root, ascending aorta and aortic arch are all structurally normal, with no evidence of dilitation or obstruction. Venous: The inferior vena cava is dilated in size with greater than 50% respiratory variability, suggesting right atrial pressure of 8 mmHg. IAS/Shunts: No atrial level shunt detected by color flow Doppler. There is no evidence of a patent foramen ovale. No ventricular septal defect is seen or detected. There is no evidence of an atrial septal defect.  LEFT VENTRICLE PLAX 2D LVIDd:         4.20 cm  Diastology LVIDs:         2.80 cm  LV e' lateral:   7.72 cm/s LV PW:         1.10 cm  LV E/e' lateral: 10.5 LV IVS:        1.10 cm  LV e' medial:    6.42 cm/s LVOT diam:     2.00 cm  LV E/e' medial:  12.7 LV SV:  49 ml LV SV Index:   28.52 LVOT Area:     3.14 cm  RIGHT VENTRICLE RV S prime:     11.10 cm/s TAPSE (M-mode): 2.4 cm LEFT ATRIUM             Index       RIGHT ATRIUM           Index LA diam:        2.90 cm 1.70 cm/m  RA Area:     14.30 cm LA Vol (A2C):   35.4 ml 20.69 ml/m RA Volume:   37.30 ml  21.80 ml/m LA Vol (A4C):   25.9 ml 15.14 ml/m LA Biplane Vol: 31.2 ml 18.24 ml/m  AORTIC VALVE  LVOT Vmax:   110.00 cm/s LVOT Vmean:  72.000 cm/s LVOT VTI:    0.280 m  AORTA Ao Root diam: 2.80 cm MITRAL VALVE                        TRICUSPID VALVE MV Area (PHT): 2.50 cm             TR Peak grad:   23.4 mmHg MV PHT:        87.87 msec           TR Vmax:        268.00 cm/s MV Decel Time: 303 msec MV E velocity: 81.40 cm/s 103 cm/s  SHUNTS MV A velocity: 83.10 cm/s 70.3 cm/s Systemic VTI:  0.28 m MV E/A ratio:  0.98       1.5       Systemic Diam: 2.00 cm  Fransico Him MD Electronically signed by Fransico Him MD Signature Date/Time: 08/22/2019/10:45:35 AM    Final    VAS US CAROTID  Result Date: 08/22/2019 Carotid Arterial Duplex Study Indications:       TIA. Risk Factors:      Hypertension, hyperlipidemia, Diabetes. Comparison Study:  no prior Performing Technologist: Abram Sander RVS  Examination Guidelines: A complete evaluation includes B-mode imaging, spectral Doppler, color Doppler, and power Doppler as needed of all accessible portions of each vessel. Bilateral testing is considered an integral part of a complete examination. Limited examinations for reoccurring indications may be performed as noted.  Right Carotid Findings: +----------+--------+--------+--------+------------------+--------+           PSV cm/sEDV cm/sStenosisPlaque DescriptionComments +----------+--------+--------+--------+------------------+--------+ CCA Prox  103     10              heterogenous               +----------+--------+--------+--------+------------------+--------+ CCA Distal76      9               heterogenous               +----------+--------+--------+--------+------------------+--------+ ICA Prox  77      17      1-39%   heterogenous               +----------+--------+--------+--------+------------------+--------+ ICA Distal146     21                                         +----------+--------+--------+--------+------------------+--------+ ECA       83      16                                          +----------+--------+--------+--------+------------------+--------+ +----------+--------+-------+--------+-------------------+  PSV cm/sEDV cmsDescribeArm Pressure (mmHG) +----------+--------+-------+--------+-------------------+ [id number]                                         +----------+--------+-------+--------+-------------------+ +---------+--------+--+--------+-+---------+ VertebralPSV cm/s55EDV cm/s8Antegrade +---------+--------+--+--------+-+---------+  Left Carotid Findings: +----------+--------+--------+--------+------------------+--------+           PSV cm/sEDV cm/sStenosisPlaque DescriptionComments +----------+--------+--------+--------+------------------+--------+ CCA Prox  111     15              heterogenous               +----------+--------+--------+--------+------------------+--------+ CCA Distal76      9               heterogenous               +----------+--------+--------+--------+------------------+--------+ ICA Prox  100     24      1-39%   heterogenous               +----------+--------+--------+--------+------------------+--------+ ICA Distal106     24                                         +----------+--------+--------+--------+------------------+--------+ ECA       62                                                 +----------+--------+--------+--------+------------------+--------+ +----------+--------+--------+--------+-------------------+           PSV cm/sEDV cm/sDescribeArm Pressure (mmHG) +----------+--------+--------+--------+-------------------+ Subclavian122                                         +----------+--------+--------+--------+-------------------+ +---------+--------+--+--------+--+---------+ VertebralPSV cm/s99EDV cm/s16Antegrade +---------+--------+--+--------+--+---------+   Summary: Right Carotid: Velocities in the right ICA are consistent with a 1-39%  stenosis. Left Carotid: Velocities in the left ICA are consistent with a 1-39% stenosis. Vertebrals: Bilateral vertebral arteries demonstrate antegrade flow. *See table(s) above for measurements and observations.     Preliminary     Medications:   .  stroke: mapping our early stages of recovery book   Does not apply Once  . acetaminophen  1,000 mg Oral TID  . aspirin EC  325 mg Oral Daily  . atorvastatin  40 mg Oral q1800  . diclofenac Sodium  2 g Topical QID  . enoxaparin (LOVENOX) injection  30 mg Subcutaneous Q24H  . insulin aspart  0-5 Units Subcutaneous QHS  . insulin aspart  0-9 Units Subcutaneous TID WC  . insulin detemir  7 Units Subcutaneous BID  . levothyroxine  100 mcg Oral QAC breakfast  . pantoprazole  40 mg Oral Daily   Continuous Infusions: . sodium chloride 75 mL/hr at 08/21/19 2026  . piperacillin-tazobactam (ZOSYN)  IV 3.375 g (08/22/19 0617)     LOS: 0 days   Geradine Girt  [facility]   How to contact the [facility] Attending or Consulting provider [place] or covering provider during after hours [place], for this patient?  1. Check the care team in [facility] and look for a) attending/consulting [facility] provider listed and b) the [facility] team listed 2. Log into [email or web address]  and use 's universal password to access. If you do not have the password, please contact the hospital operator. 3. Locate the [facility] provider you are looking for under [facility] and page to a number that you can be directly reached. 4. If you still have difficulty reaching the provider, please page the [facility] (Director on Call) for the Hospitalists listed on amion for assistance.  08/22/2019, 11:58 AM

## 2019-08-22 NOTE — Plan of Care (Signed)
  Problem: Education: Goal: Knowledge of General Education information will improve Description: Including pain rating scale, medication(s)/side effects and non-pharmacologic comfort measures Outcome: Progressing   Problem: Activity: Goal: Risk for activity intolerance will decrease Outcome: Progressing   

## 2019-08-23 ENCOUNTER — Encounter (HOSPITAL_COMMUNITY)
Admission: EM | Disposition: A | Discharge status: Home / Self Care | Payer: Self-pay | Source: Home / Self Care | Attending: Emergency Medicine

## 2019-08-23 DIAGNOSIS — N179 Acute kidney failure, unspecified: Secondary | ICD-10-CM | POA: Diagnosis not present

## 2019-08-23 DIAGNOSIS — I6389 Other cerebral infarction: Secondary | ICD-10-CM

## 2019-08-23 DIAGNOSIS — G9341 Metabolic encephalopathy: Secondary | ICD-10-CM | POA: Diagnosis not present

## 2019-08-23 HISTORY — PX: LOOP RECORDER INSERTION: EP1214

## 2019-08-23 LAB — BASIC METABOLIC PANEL
Anion gap: 11 (ref 5–15)
Anion gap: 7 (ref 5–15)
BUN: 18 mg/dL (ref 8–23)
BUN: 15 mg/dL (ref 8–23)
CO2: 23 mmol/L (ref 22–32)
CO2: 21 mmol/L — ABNORMAL LOW (ref 22–32)
Calcium: 9.4 mg/dL (ref 8.9–10.3)
Calcium: 8.9 mg/dL (ref 8.9–10.3)
Chloride: 103 mmol/L (ref 98–111)
Chloride: 103 mmol/L (ref 98–111)
Creatinine, Ser: 1.31 mg/dL — ABNORMAL HIGH (ref 0.44–1.00)
Creatinine, Ser: 1.28 mg/dL — ABNORMAL HIGH (ref 0.44–1.00)
GFR calc Af Amer: 44 mL/min — ABNORMAL LOW (ref >60)
GFR calc Af Amer: 45 mL/min — ABNORMAL LOW (ref >60)
GFR calc non Af Amer: 38 mL/min — ABNORMAL LOW (ref >60)
GFR calc non Af Amer: 39 mL/min — ABNORMAL LOW (ref >60)
Glucose, Bld: 84 mg/dL (ref 70–99)
Glucose, Bld: 315 mg/dL — ABNORMAL HIGH (ref 70–99)
Potassium: 3.9 mmol/L (ref 3.5–5.1)
Potassium: 4.4 mmol/L (ref 3.5–5.1)
Sodium: 137 mmol/L (ref 135–145)
Sodium: 131 mmol/L — ABNORMAL LOW (ref 135–145)

## 2019-08-23 LAB — CBC
HCT: 34.4 % — ABNORMAL LOW (ref 36.0–46.0)
Hemoglobin: 11.3 g/dL — ABNORMAL LOW (ref 12.0–15.0)
MCH: 32.1 pg (ref 26.0–34.0)
MCHC: 32.8 g/dL (ref 30.0–36.0)
MCV: 97.7 fL (ref 80.0–100.0)
Platelets: 256 10*3/uL (ref 150–400)
RBC: 3.52 MIL/uL — ABNORMAL LOW (ref 3.87–5.11)
RDW: 12.5 % (ref 11.5–15.5)
WBC: 8.2 10*3/uL (ref 4.0–10.5)
nRBC: 0 % (ref 0.0–0.2)

## 2019-08-23 LAB — GLUCOSE, CAPILLARY
Glucose-Capillary: 51 mg/dL — ABNORMAL LOW (ref 70–99)
Glucose-Capillary: 129 mg/dL — ABNORMAL HIGH (ref 70–99)
Glucose-Capillary: 295 mg/dL — ABNORMAL HIGH (ref 70–99)
Glucose-Capillary: 318 mg/dL — ABNORMAL HIGH (ref 70–99)

## 2019-08-23 LAB — LIPID PANEL
Cholesterol: 266 mg/dL — ABNORMAL HIGH (ref 0–200)
HDL: 118 mg/dL (ref >40)
LDL Cholesterol: 133 mg/dL — ABNORMAL HIGH (ref 0–99)
Total CHOL/HDL Ratio: 2.3 RATIO
Triglycerides: 77 mg/dL (ref <150)
VLDL: 15 mg/dL (ref 0–40)

## 2019-08-23 LAB — CULTURE, BLOOD (ROUTINE X 2)
Culture: NO GROWTH
Culture: NO GROWTH

## 2019-08-23 SURGERY — LOOP RECORDER INSERTION

## 2019-08-23 MED ORDER — LIDOCAINE-EPINEPHRINE 1 %-1:100000 IJ SOLN
INTRAMUSCULAR | Status: AC
  Filled 2019-08-23: qty 1

## 2019-08-23 MED ORDER — LIDOCAINE-EPINEPHRINE 1 %-1:100000 IJ SOLN
INTRAMUSCULAR | Status: DC | PRN
  Administered 2019-08-23: 10 mL

## 2019-08-23 MED ORDER — CLOPIDOGREL BISULFATE 75 MG PO TABS: 75.0000 mg | ORAL_TABLET | Freq: Every day | ORAL | 0 refills | Status: AC

## 2019-08-23 SURGICAL SUPPLY — 2 items
MONITOR REVEAL LINQ II (Prosthesis & Implant Heart) ×2 IMPLANT
PACK LOOP INSERTION (CUSTOM PROCEDURE TRAY) ×2 IMPLANT

## 2019-08-23 NOTE — Progress Notes (Signed)
[name] [name] to be discharged Home per MD order. Discussed prescriptions and follow up appointments with the patient. Prescriptions given to patient; medication list explained in detail. Patient verbalized understanding.  Skin clean, dry and intact without evidence of skin break down, no evidence of skin tears noted. IV catheter discontinued intact. Site without signs and symptoms of complications. Dressing and pressure applied. Pt denies pain at the site currently. No complaints noted.  Patient free of lines, drains, and wounds.   An After Visit Summary (AVS) was printed and given to the patient. Patient escorted via wheelchair, and discharged home via private auto.  Shela Commons, RN

## 2019-08-23 NOTE — Progress Notes (Signed)
Inpatient Diabetes Program Recommendations  AACE/ADA: New Consensus Statement on Inpatient Glycemic Control (2015)  Target Ranges:  Prepandial:   less than 140 mg/dL      Peak postprandial:   less than 180 mg/dL (1-2 hours)      Critically ill patients:  140 - 180 mg/dL   Lab Results  Component Value Date   GLUCAP 129 (H) 08/23/2019   HGBA1C 8.6 (H) 08/18/2019    Review of Glycemic Control Results for [name] [name], [name] [name] (MRN [id number]) as of 08/23/2019 08:31  Ref. Range 08/22/2019 16:50 08/22/2019 21:29 08/23/2019 06:48 08/23/2019 08:07  Glucose-Capillary Latest Ref Range: 70 - 99 mg/dL 310 (H) 266 (H) 51 (L) 129 (H)    Diabetes history: DM 2 Outpatient Diabetes medications:  Novolog 6-8 units tid with meals, Levemir 7 units bid Current orders for Inpatient glycemic control:  Novolog sensitive tid with meals and HS Levemir 7 units bid Inpatient Diabetes Program Recommendations:    Noted hypoglycemia of 51 mg/dL. May want to consider the following: -Decreasing Levemir to 5 units QHS -Discontinue Novolog 0-5 units QHS -Adding Novolog meal coverage 2 units tid with meals.   Thanks, Bronson Curb, MSN, RNC-OB Diabetes Coordinator [phone] (8a-5p)

## 2019-08-23 NOTE — Discharge Instructions (Signed)
Post implant site/wound care instructions °Keep incision clean and dry for 3 days. °You can remove outer dressing tomorrow. °Leave steri-strips (little pieces of tape) on until seen in the office for wound check appointment. °Call the office ([phone]) for redness, drainage, swelling, or fever. ° °

## 2019-08-23 NOTE — Discharge Summary (Signed)
Physician Discharge Summary  [name] [name] [id number] DOB: October 18, 1933 DOA: 08/21/2019  PCP: Lajean Manes, MD  Admit date: 08/21/2019 Discharge date: 08/23/2019  Admitted From: Home Disposition: Home  Recommendations for Outpatient Follow-up:  1. Follow up with PCP in 1-2 weeks 2. Follow with neurology in 6 weeks 3. Follow with cardiology per their recommendations 4. Please obtain BMP/CBC in one week 5. Please follow up on the following pending results:  Home Health: None Equipment/Devices: None  Discharge Condition: Stable CODE STATUS: Full code Diet recommendation: Cardiac  Subjective: Seen and examined earlier.  Daughter at the bedside.  Patient has no complaints.  Brief/Interim Summary: [name] [name] is an 86 y.o. female with medical history significant ofhypertension, hyperlipidemia, diabetes mellitus type 2, hypothyroidism, chronic back pain, dementia, and GERD. Presents after being found to be acutely altered. Last known normal around 8 PM the night before in early morning of 2:30 AM.Patient had just recently been discharged from the hospital on 08/20/2019 after initially presenting with altered mental status. Found to have enterocolitis for which patient was prescribed antibiotics of ciprofloxacin and metronidazole which she had 3 more days to complete her course.    Reportedly patient was doing well when she left the hospital. The next morning she was found standing by the sink. She seemed as if she were going to collapse and kept stating that her legs were weak. Her daughter was able to sit her down so that she did not fall. The patient was complaining of feeling dizzy, sweating profusely, confused, and babbling.  Patient was placed on the gurney and had a syncopal episode per family.    Patient was admitted under hospital service for syncope and she was also confused so she was admitted with acute metabolic encephalopathy.  There was question of TIA versus seizure.   Neurology was consulted.  She underwent EEG, MRI as well as MRI of the brain and all of them were negative including TTE and carotid ultrasound.  She was also started on Plavix in addition to continuation of her aspirin.  For her enterocolitis, she was continued here on Zosyn.  She also came in with mild elevation of creatinine making a diagnosis of acute on chronic kidney disease stage III.  She was hydrated for that.  Her renal function have improved and is back to her baseline.  Neurology had suggested that the cause of her syncope could be arrhythmia for which cardiology was consulted and eventually patient had implantable loop recorder placed by cardiology today.  She has been cleared by cardiology as well as neurology so she is going to be discharged on 3 more weeks of Plavix along with aspirin and then will be continuing only aspirin.  Of note, patient also complained of right shoulder pain while here x-ray of the shoulder was obtained which ruled out any dislocation or fracture but showed calcific tendinosis of supraspinatus.  She is being discharged in stable condition.  Will follow with PCP and neurology and cardiology.  Discharge Diagnoses:  Principal Problem:   Acute metabolic encephalopathy Active Problems:   Hypothyroidism, acquired   Type 2 diabetes mellitus without complication, with long-term current use of insulin (HCC)   Acute kidney injury superimposed on chronic kidney disease (HCC)   Enterocolitis   Right shoulder pain   Leukocytosis    Discharge Instructions  Discharge Instructions    Ambulatory referral to Occupational Therapy   Complete by: As directed    Discharge patient   Complete by: As directed  Discharge disposition: 01-Home or Self Care   Discharge patient date: 08/23/2019     Allergies as of 08/23/2019      Reactions   Sulfonamide Derivatives Hives   Bactrim [sulfamethoxazole-trimethoprim] Rash   Reported by [name]   Lisinopril Cough   Reported  by [name]      Medication List    TAKE these medications   acetaminophen 500 MG tablet Commonly known as: TYLENOL Take 500 mg by mouth every 6 (six) hours as needed for mild pain.   alendronate 70 MG tablet Commonly known as: FOSAMAX TAKE 1 TABLET BY MOUTH EVERY 7 DAYS ON AN EMPTY STOMACH WITH A FULL GLASS OF WATER What changed: See the new instructions.   aspirin EC 81 MG tablet Take 81 mg by mouth daily.   atorvastatin 40 MG tablet Commonly known as: LIPITOR Take 40 mg by mouth daily at 6 PM.   B-D UF III MINI PEN NEEDLES 31G X 5 MM Misc Generic drug: Insulin Pen Needle USE AS DIRECTED TID   calcium-vitamin D 250-100 MG-UNIT tablet Take 1 tablet by mouth daily.   cholecalciferol 1000 units tablet Commonly known as: VITAMIN D Take 1,000 Units by mouth daily.   ciprofloxacin 250 MG tablet Commonly known as: CIPRO Take 1 tablet (250 mg total) by mouth 2 (two) times daily.   clopidogrel 75 MG tablet Commonly known as: PLAVIX Take 1 tablet (75 mg total) by mouth daily for 20 days. Start taking on: August 24, 2019   Fish Oil 1000 MG Caps Take 1,000 mg by mouth 2 (two) times daily.   Fluad 0.5 ML Susy Generic drug: Influenza Vac A&B Surf Ant Adj ADM 0.5ML IM UTD   Fusion Plus Caps Take 1 capsule by mouth daily.   Levemir FlexTouch 100 UNIT/ML Pen Generic drug: Insulin Detemir Inject 7 Units into the skin 2 (two) times daily.   levothyroxine 100 MCG tablet Commonly known as: SYNTHROID Take 100 mcg by mouth daily before breakfast.   losartan 50 MG tablet Commonly known as: COZAAR Take 50 mg by mouth daily.   metroNIDAZOLE 500 MG tablet Commonly known as: FLAGYL Take 1 tablet (500 mg total) by mouth every 8 (eight) hours.   mometasone 50 MCG/ACT nasal spray Commonly known as: NASONEX Place 2 sprays into the nose daily as needed (nasal congestion).   multivitamin with minerals Tabs tablet Take 1 tablet by mouth daily.   NovoLOG 100 UNIT/ML  injection Generic drug: insulin aspart Inject 6-8 Units into the skin 3 (three) times daily before meals. SLIDING SCALE: 70-199= 6 UNITS 200-299= 7 UNITS >300= 8 UNITS   pantoprazole 40 MG tablet Commonly known as: PROTONIX Take 40 mg by mouth daily.      Follow-up Information    [facility].   Specialty: Cardiology Why: 08/31/2019 @ 12:00PM (noon), wound care instructions Contact information: [address] [place]       Lajean Manes, MD Follow up in 1 week(s).   Specialty: Internal Medicine Contact information: [address] [place] [address] [phone]          Allergies  Allergen Reactions  . Sulfonamide Derivatives Hives  . Bactrim [Sulfamethoxazole-Trimethoprim] Rash    Reported by [facility]  . Lisinopril Cough    Reported by [facility]    Consultations: Neurology and cardiology   Procedures/Studies: EEG  Result Date: 08/21/2019 Lora Havens, MD  08/21/2019  4:17 PM Patient Name: NIMRIT PANTEL MRN: [id number] Epilepsy Attending: Lora Havens Referring Physician/Provider: Dr. Fuller Plan Date: 08/21/2019 Duration: 23.28 minutes Patient history:  86 year-old female who presented with altered mental status, garbled speech as well as right gaze preference and right facial droop transiently.  EEG to evaluate for seizure. Level of alertness: Awake, asleep AEDs during EEG study: None Technical aspects: This EEG study was done with scalp electrodes positioned according to the 10-20 International system of electrode placement. Electrical activity was acquired at a sampling rate of 500Hz  and reviewed with a high frequency filter of 70Hz  and a low frequency filter of 1Hz . EEG data were recorded continuously and digitally stored. Description:  The posterior dominant rhythm consists of 8 Hz activity of moderate voltage (25-35 uV) seen  predominantly in posterior head regions, symmetric and reactive to eye opening and eye closing.  Sleep was characterized by intermittent rhythmic 2-4Hz  theta- delta slowing. Hyperventilation and photic stimulation were not performed. IMPRESSION: This study is within normal limits. No seizures or epileptiform discharges were seen throughout the recording. Lora Havens   DG Shoulder Right  Result Date: 08/21/2019 CLINICAL DATA:  Right shoulder pain. EXAM: RIGHT SHOULDER - 2+ VIEW COMPARISON:  None. FINDINGS: There is no acute displaced fracture or dislocation. Mild degenerative changes are noted of the glenohumeral joint. There is calcific tendinosis of the supraspinatus. Osteopenia is noted. IMPRESSION: 1. No acute displaced fracture or dislocation. 2. Calcific tendinosis of the supraspinatus. Electronically Signed   By: Constance Holster M.D.   On: 08/21/2019 20:01   CT HEAD WO CONTRAST  Result Date: 08/21/2019 CLINICAL DATA:  Transit ischemic attack. RIGHT-sided deficit. EXAM: CT HEAD WITHOUT CONTRAST TECHNIQUE: Contiguous axial images were obtained from the base of the skull through the vertex without intravenous contrast. COMPARISON:  None. FINDINGS: Brain: No acute intracranial hemorrhage. No focal mass lesion. No CT evidence of acute infarction. No midline shift or mass effect. No hydrocephalus. Basilar cisterns are patent. There are periventricular and subcortical white matter hypodensities. Generalized cortical atrophy. Vascular: No hyperdense vessel or unexpected calcification. Skull: Normal. Negative for fracture or focal lesion. Sinuses/Orbits: Paranasal sinuses and mastoid air cells are clear. Orbits are clear. Other: None. IMPRESSION: 1. No acute intracranial findings.  No change from prior. 2. Atrophy and white matter microvascular disease. Electronically Signed   By: Suzy Bouchard M.D.   On: 08/21/2019 08:26   CT Head Wo Contrast  Result Date: 08/18/2019 CLINICAL DATA:  Altered mental  status EXAM: CT HEAD WITHOUT CONTRAST TECHNIQUE: Contiguous axial images were obtained from the base of the skull through the vertex without intravenous contrast. COMPARISON:  12/21/2016 FINDINGS: Brain: Stable chronic periventricular small vessel disease. No acute intracranial abnormality. Specifically, no hemorrhage, hydrocephalus, mass lesion, acute infarction, or significant intracranial injury. Vascular: No hyperdense vessel or unexpected calcification. Skull: No acute calvarial abnormality. Sinuses/Orbits: Visualized paranasal sinuses and mastoids clear. Orbital soft tissues unremarkable. Other: None IMPRESSION: Chronic small vessel disease.  No acute intracranial abnormality. Electronically Signed   By: Rolm Baptise M.D.   On: 08/18/2019 00:55   MR ANGIO HEAD WO CONTRAST  Result Date: 08/21/2019 CLINICAL DATA:  Slurred speech and lower extremity weakness EXAM: MRI HEAD WITHOUT CONTRAST MRA HEAD WITHOUT CONTRAST TECHNIQUE: Multiplanar, multiecho pulse sequences of the brain and surrounding structures were obtained without intravenous contrast. Angiographic images of the head were obtained using MRA technique without contrast. COMPARISON:  None. FINDINGS: MRI HEAD FINDINGS BRAIN: No acute infarct, acute  hemorrhage or extra-axial collection. Multifocal white matter hyperintensity, most commonly due to chronic ischemic microangiopathy. There is generalized atrophy without lobar predilection. Midline structures are normal. VASCULAR: Major flow voids are preserved. Susceptibility-sensitive sequences show no chronic microhemorrhage or superficial siderosis. SKULL AND UPPER CERVICAL SPINE: Normal calvarium and skull base. Visualized upper cervical spine and soft tissues are normal. SINUSES/ORBITS: No fluid levels or advanced mucosal thickening. No mastoid or middle ear effusion. Normal orbits. MRA HEAD FINDINGS POSTERIOR CIRCULATION: --Vertebral arteries: Normal V4 segments. --Posterior inferior cerebellar  arteries (PICA): Patent origins from the vertebral arteries. --Anterior inferior cerebellar arteries (AICA): Patent origins from the basilar artery. --Basilar artery: Normal. --Superior cerebellar arteries: Normal. --Posterior cerebral arteries: Normal. The right PCA is partially supplied by a posterior communicating artery (p-comm). ANTERIOR CIRCULATION: --Intracranial internal carotid arteries: Normal. --Anterior cerebral arteries (ACA): Normal. Both A1 segments are present. Patent anterior communicating artery (a-comm). --Middle cerebral arteries (MCA): Normal. IMPRESSION: 1. No acute intracranial process. 2. Generalized atrophy and chronic ischemic microangiopathy. 3. Normal intracranial MRA. Electronically Signed   By: Ulyses Jarred M.D.   On: 08/21/2019 20:13   MR BRAIN WO CONTRAST  Result Date: 08/21/2019 CLINICAL DATA:  Slurred speech and lower extremity weakness EXAM: MRI HEAD WITHOUT CONTRAST MRA HEAD WITHOUT CONTRAST TECHNIQUE: Multiplanar, multiecho pulse sequences of the brain and surrounding structures were obtained without intravenous contrast. Angiographic images of the head were obtained using MRA technique without contrast. COMPARISON:  None. FINDINGS: MRI HEAD FINDINGS BRAIN: No acute infarct, acute hemorrhage or extra-axial collection. Multifocal white matter hyperintensity, most commonly due to chronic ischemic microangiopathy. There is generalized atrophy without lobar predilection. Midline structures are normal. VASCULAR: Major flow voids are preserved. Susceptibility-sensitive sequences show no chronic microhemorrhage or superficial siderosis. SKULL AND UPPER CERVICAL SPINE: Normal calvarium and skull base. Visualized upper cervical spine and soft tissues are normal. SINUSES/ORBITS: No fluid levels or advanced mucosal thickening. No mastoid or middle ear effusion. Normal orbits. MRA HEAD FINDINGS POSTERIOR CIRCULATION: --Vertebral arteries: Normal V4 segments. --Posterior inferior  cerebellar arteries (PICA): Patent origins from the vertebral arteries. --Anterior inferior cerebellar arteries (AICA): Patent origins from the basilar artery. --Basilar artery: Normal. --Superior cerebellar arteries: Normal. --Posterior cerebral arteries: Normal. The right PCA is partially supplied by a posterior communicating artery (p-comm). ANTERIOR CIRCULATION: --Intracranial internal carotid arteries: Normal. --Anterior cerebral arteries (ACA): Normal. Both A1 segments are present. Patent anterior communicating artery (a-comm). --Middle cerebral arteries (MCA): Normal. IMPRESSION: 1. No acute intracranial process. 2. Generalized atrophy and chronic ischemic microangiopathy. 3. Normal intracranial MRA. Electronically Signed   By: Ulyses Jarred M.D.   On: 08/21/2019 20:13   CT ABDOMEN PELVIS W CONTRAST  Result Date: 08/18/2019 CLINICAL DATA:  Bowel obstruction suspected EXAM: CT ABDOMEN AND PELVIS WITH CONTRAST TECHNIQUE: Multidetector CT imaging of the abdomen and pelvis was performed using the standard protocol following bolus administration of intravenous contrast. CONTRAST:  159mL OMNIPAQUE IOHEXOL 300 MG/ML  SOLN COMPARISON:  CT abdomen pelvis Dec 01, 2016 FINDINGS: Lower chest: Lung bases are clear. Normal heart size. No pericardial effusion. Coronary artery calcifications are noted. Hepatobiliary: Diffuse hepatic hypoattenuation compatible with hepatic steatosis. No focal liver abnormality is seen. Gallbladder is decompressed. Radiodensity at the gallbladder fossa is in similar position to prior though difficult to fully visualize given extensive respiratory motion artifact in the upper abdomen. No gross biliary ductal dilatation is seen. Pancreas: Unremarkable. No pancreatic ductal dilatation or surrounding inflammatory changes. Spleen: Normal in size without focal abnormality. Adrenals/Urinary Tract: Normal adrenal glands. 2.3  cm hypoattenuating cyst seen in the lower pole left kidney. No concerning  renal lesions. No visible urolithiasis or hydronephrosis. Circumferential bladder wall thickening with some adjacent perivesicular haze. Stomach/Bowel: Distal esophagus, stomach and duodenum are unremarkable. Much of the small bowel is decompressed. The bowel is difficult to fully assess given extensive motion artifact and a paucity of intraperitoneal fat though question several loops of small bowel in the mid abdomen which appear slightly thickened for example coronal series 6 image 63. no evidence of bowel obstruction is seen however. The appendix is not well visualized. No focal pericecal inflammation to suggest an occult appendicitis. Moderate volume of stool throughout the colon. Mild pericolonic edematous changes are noted diffusely. Vascular/Lymphatic: Extensive atherosclerotic plaque is seen throughout the aorta and branch vessels. Question near complete occlusion of the proximal left common iliac artery. No aneurysm or ectasia. Reproductive: Uterus is surgically absent. No concerning adnexal lesions. Other: Increased attenuation of mesenteric, may be reactive stranding or related to hydration status. No free air or free fluid is seen. No bowel containing hernias. Postsurgical changes of the low anterior abdominal wall. Musculoskeletal: Multilevel degenerative changes are present in the imaged portions of the spine. No acute osseous abnormality or suspicious osseous lesion. The osseous structures appear diffusely demineralized which may limit detection of small or nondisplaced fractures. IMPRESSION: 1. Much of the small bowel is decompressed though question several loops of small bowel in the mid abdomen appear slightly thickened. Additional faint pericolonic hazy stranding is noted as well. Findings could reflect an infectious or inflammatory enterocolitis. No evidence of bowel obstruction is seen however. 2. Circumferential bladder wall thickening with some adjacent perivesicular haze. Correlate with  urinalysis to exclude underlying cystitis. 3. Hepatic steatosis. 4. Extensive atherosclerotic plaque throughout the aorta and branch vessels. Likely near complete occlusion of the proximal left common iliac artery. Electronically Signed   By: Lovena Le M.D.   On: 08/18/2019 03:14   EP PPM/ICD IMPLANT  Result Date: 08/23/2019 CONCLUSIONS:  1. Successful implantation of a [employer] Reveal LINQ implantable loop recorder for cryptogenic stroke  2. No early apparent complications. Cristopher Peru, MD 08/23/2019 1:33 PM   DG Chest Portable 1 View  Result Date: 08/17/2019 CLINICAL DATA:  Fever and altered mental status. EXAM: PORTABLE CHEST 1 VIEW COMPARISON:  December 21, 2016 FINDINGS: There is no evidence of acute infiltrate, pleural effusion or pneumothorax. The heart size and mediastinal contours are within normal limits. There is moderate severity calcification of the aortic arch. The visualized skeletal structures are unremarkable. IMPRESSION: No active disease. Electronically Signed   By: Virgina Norfolk M.D.   On: 08/17/2019 23:44   ECHOCARDIOGRAM COMPLETE  Result Date: 08/22/2019   ECHOCARDIOGRAM REPORT   Patient Name:   GENISE PEHLKE Date of Exam: 08/22/2019 Medical Rec #:  [id number]       Height:       65.5 in Accession #:    [id number]      Weight:       140.2 lb Date of Birth:  May 09, 1934       BSA:          1.71 m Patient Age:    23 years        BP:           147/61 mmHg Patient Gender: F               HR:           59 bpm. Exam Location:  Inpatient  Procedure: 2D Echo Indications:    TIA G45.9  History:        Patient has prior history of Echocardiogram examinations, most                 recent 11/29/2016. Risk Factors:Hypertension, Diabetes and                 Dyslipidemia.  Sonographer:    Mikki Santee RDCS (AE) Referring Phys: [id number] RONDELL A SMITH IMPRESSIONS  1. Left ventricular ejection fraction, by visual estimation, is 60 to 65%. The left ventricle has normal function. Left ventricular  septal wall thickness was mildly increased. Mildly increased left ventricular posterior wall thickness. There is no left ventricular hypertrophy.  2. Elevated left ventricular end-diastolic pressure.  3. Left ventricular diastolic parameters are indeterminate.  4. The left ventricle has no regional wall motion abnormalities.  5. Global right ventricle has normal systolic function.The right ventricular size is normal. No increase in right ventricular wall thickness.  6. Left atrial size was normal.  7. Right atrial size was normal.  8. The mitral valve is normal in structure. Trivial mitral valve regurgitation. No evidence of mitral stenosis.  9. The tricuspid valve is normal in structure. Tricuspid valve regurgitation is mild. 10. The aortic valve is normal in structure. Aortic valve regurgitation is not visualized. No evidence of aortic valve sclerosis or stenosis. 11. The pulmonic valve was normal in structure. Pulmonic valve regurgitation is not visualized. 12. Normal pulmonary artery systolic pressure. 13. The inferior vena cava is dilated in size with >50% respiratory variability, suggesting right atrial pressure of 8 mmHg. FINDINGS  Left Ventricle: Left ventricular ejection fraction, by visual estimation, is 60 to 65%. The left ventricle has normal function. The left ventricle has no regional wall motion abnormalities. Mildly increased left ventricular posterior wall thickness. There is no left ventricular hypertrophy. Left ventricular diastolic parameters are indeterminate. Elevated left ventricular end-diastolic pressure. Right Ventricle: The right ventricular size is normal. No increase in right ventricular wall thickness. Global RV systolic function is has normal systolic function. The tricuspid regurgitant velocity is 2.42 m/s, and with an assumed right atrial pressure  of 8 mmHg, the estimated right ventricular systolic pressure is normal at 31.4 mmHg. Left Atrium: Left atrial size was normal in size.  Right Atrium: Right atrial size was normal in size Pericardium: There is no evidence of pericardial effusion. Mitral Valve: The mitral valve is normal in structure. Trivial mitral valve regurgitation. No evidence of mitral valve stenosis by observation. Tricuspid Valve: The tricuspid valve is normal in structure. Tricuspid valve regurgitation is mild. Aortic Valve: The aortic valve is normal in structure. Aortic valve regurgitation is not visualized. The aortic valve is structurally normal, with no evidence of sclerosis or stenosis. Pulmonic Valve: The pulmonic valve was normal in structure. Pulmonic valve regurgitation is not visualized. Pulmonic regurgitation is not visualized. Aorta: The aortic root, ascending aorta and aortic arch are all structurally normal, with no evidence of dilitation or obstruction. Venous: The inferior vena cava is dilated in size with greater than 50% respiratory variability, suggesting right atrial pressure of 8 mmHg. IAS/Shunts: No atrial level shunt detected by color flow Doppler. There is no evidence of a patent foramen ovale. No ventricular septal defect is seen or detected. There is no evidence of an atrial septal defect.  LEFT VENTRICLE PLAX 2D LVIDd:         4.20 cm  Diastology LVIDs:         2.80  cm  LV e' lateral:   7.72 cm/s LV PW:         1.10 cm  LV E/e' lateral: 10.5 LV IVS:        1.10 cm  LV e' medial:    6.42 cm/s LVOT diam:     2.00 cm  LV E/e' medial:  12.7 LV SV:         49 ml LV SV Index:   28.52 LVOT Area:     3.14 cm  RIGHT VENTRICLE RV S prime:     11.10 cm/s TAPSE (M-mode): 2.4 cm LEFT ATRIUM             Index       RIGHT ATRIUM           Index LA diam:        2.90 cm 1.70 cm/m  RA Area:     14.30 cm LA Vol (A2C):   35.4 ml 20.69 ml/m RA Volume:   37.30 ml  21.80 ml/m LA Vol (A4C):   25.9 ml 15.14 ml/m LA Biplane Vol: 31.2 ml 18.24 ml/m  AORTIC VALVE LVOT Vmax:   110.00 cm/s LVOT Vmean:  72.000 cm/s LVOT VTI:    0.280 m  AORTA Ao Root diam: 2.80 cm MITRAL  VALVE                        TRICUSPID VALVE MV Area (PHT): 2.50 cm             TR Peak grad:   23.4 mmHg MV PHT:        87.87 msec           TR Vmax:        268.00 cm/s MV Decel Time: 303 msec MV E velocity: 81.40 cm/s 103 cm/s  SHUNTS MV A velocity: 83.10 cm/s 70.3 cm/s Systemic VTI:  0.28 m MV E/A ratio:  0.98       1.5       Systemic Diam: 2.00 cm  Fransico Him MD Electronically signed by Fransico Him MD Signature Date/Time: 08/22/2019/10:45:35 AM    Final    VAS US CAROTID  Result Date: 08/22/2019 Carotid Arterial Duplex Study Indications:       TIA. Risk Factors:      Hypertension, hyperlipidemia, Diabetes. Comparison Study:  no prior Performing Technologist: Abram Sander RVS  Examination Guidelines: A complete evaluation includes B-mode imaging, spectral Doppler, color Doppler, and power Doppler as needed of all accessible portions of each vessel. Bilateral testing is considered an integral part of a complete examination. Limited examinations for reoccurring indications may be performed as noted.  Right Carotid Findings: +----------+--------+--------+--------+------------------+--------+           PSV cm/sEDV cm/sStenosisPlaque DescriptionComments +----------+--------+--------+--------+------------------+--------+ CCA Prox  103     10              heterogenous               +----------+--------+--------+--------+------------------+--------+ CCA Distal76      9               heterogenous               +----------+--------+--------+--------+------------------+--------+ ICA Prox  77      17      1-39%   heterogenous               +----------+--------+--------+--------+------------------+--------+ ICA Distal146     21                                         +----------+--------+--------+--------+------------------+--------+  ECA       83      16                                         +----------+--------+--------+--------+------------------+--------+  +----------+--------+-------+--------+-------------------+           PSV cm/sEDV cmsDescribeArm Pressure (mmHG) +----------+--------+-------+--------+-------------------+ [id number]                                         +----------+--------+-------+--------+-------------------+ +---------+--------+--+--------+-+---------+ VertebralPSV cm/s55EDV cm/s8Antegrade +---------+--------+--+--------+-+---------+  Left Carotid Findings: +----------+--------+--------+--------+------------------+--------+           PSV cm/sEDV cm/sStenosisPlaque DescriptionComments +----------+--------+--------+--------+------------------+--------+ CCA Prox  111     15              heterogenous               +----------+--------+--------+--------+------------------+--------+ CCA Distal76      9               heterogenous               +----------+--------+--------+--------+------------------+--------+ ICA Prox  100     24      1-39%   heterogenous               +----------+--------+--------+--------+------------------+--------+ ICA Distal106     24                                         +----------+--------+--------+--------+------------------+--------+ ECA       62                                                 +----------+--------+--------+--------+------------------+--------+ +----------+--------+--------+--------+-------------------+           PSV cm/sEDV cm/sDescribeArm Pressure (mmHG) +----------+--------+--------+--------+-------------------+ Subclavian122                                         +----------+--------+--------+--------+-------------------+ +---------+--------+--+--------+--+---------+ VertebralPSV cm/s99EDV cm/s16Antegrade +---------+--------+--+--------+--+---------+   Summary: Right Carotid: Velocities in the right ICA are consistent with a 1-39% stenosis. Left Carotid: Velocities in the left ICA are consistent with a 1-39% stenosis.  Vertebrals: Bilateral vertebral arteries demonstrate antegrade flow. *See table(s) above for measurements and observations.  Electronically signed by Antony Contras MD on 08/22/2019 at 1:06:36 PM.    Final       Discharge Exam: Vitals:   08/23/19 0431 08/23/19 1013  BP: (!) 169/56 (!) 164/66  Pulse: 65 69  Resp: 16 18  Temp: 98 F (36.7 C) 98.6 F (37 C)  SpO2: 100% 100%   Vitals:   08/22/19 1649 08/22/19 2127 08/23/19 0431 08/23/19 1013  BP: (!) 162/66 (!) 148/58 (!) 169/56 (!) 164/66  Pulse: 60 63 65 69  Resp: 18 18 16 18   Temp: 98 F (36.7 C) 98.4 F (36.9 C) 98 F (36.7 C) 98.6 F (37 C)  TempSrc: Oral Oral Oral Oral  SpO2: 100%  100% 100%  Weight:   63.1 kg     General: Pt is alert,  awake, not in acute distress Cardiovascular: RRR, S1/S2 +, no rubs, no gallops Respiratory: CTA bilaterally, no wheezing, no rhonchi Abdominal: Soft, NT, ND, bowel sounds + Extremities: no edema, no cyanosis    The results of significant diagnostics from this hospitalization (including imaging, microbiology, ancillary and laboratory) are listed below for reference.     Microbiology: Recent Results (from the past 240 hour(s))  Blood culture (routine x 2)     Status: None   Collection Time: 08/17/19 12:36 AM   Specimen: BLOOD LEFT HAND  Result Value Ref Range Status   Specimen Description BLOOD LEFT HAND  Final   Special Requests   Final    BOTTLES DRAWN AEROBIC AND ANAEROBIC Blood Culture results may not be optimal due to an inadequate volume of blood received in culture bottles   Culture   Final    NO GROWTH 5 DAYS Performed at [facility], [place] [address], [place], [place] [address]    Report Status 08/23/2019 FINAL  Final  Blood culture (routine x 2)     Status: None   Collection Time: 08/17/19 12:37 AM   Specimen: BLOOD  Result Value Ref Range Status   Specimen Description BLOOD RIGHT ANTECUBITAL  Final   Special Requests   Final    BOTTLES DRAWN AEROBIC AND  ANAEROBIC Blood Culture results may not be optimal due to an inadequate volume of blood received in culture bottles   Culture   Final    NO GROWTH 5 DAYS Performed at [facility], [place] [address] [place], [place] [address]    Report Status 08/23/2019 FINAL  Final  Respiratory Panel by RT PCR (Flu A&B, Covid) - Nasopharyngeal Swab     Status: None   Collection Time: 08/17/19 11:15 PM   Specimen: Nasopharyngeal Swab  Result Value Ref Range Status   SARS Coronavirus 2 by RT PCR NEGATIVE NEGATIVE Final    Comment: (NOTE) SARS-CoV-2 target nucleic acids are NOT DETECTED. The SARS-CoV-2 RNA is generally detectable in upper respiratoy specimens during the acute phase of infection. The lowest concentration of SARS-CoV-2 viral copies this assay can detect is 131 copies/mL. A negative result does not preclude SARS-Cov-2 infection and should not be used as the sole basis for treatment or other patient management decisions. A negative result may occur with  improper specimen collection/handling, submission of specimen other than nasopharyngeal swab, presence of viral mutation(s) within the areas targeted by this assay, and inadequate number of viral copies (<131 copies/mL). A negative result must be combined with clinical observations, patient history, and epidemiological information. The expected result is Negative. Fact Sheet for Patients:  PinkCheek.be Fact Sheet for Healthcare Providers:  GravelBags.it This test is not yet ap proved or cleared by the [place] FDA and  has been authorized for detection and/or diagnosis of SARS-CoV-2 by FDA under an Emergency Use Authorization (EUA). This EUA will remain  in effect (meaning this test can be used) for the duration of the COVID-19 declaration under Section 564(b)(1) of the Act, 21 U.S.C. section 360bbb-3(b)(1), unless the authorization is terminated or revoked sooner.     Influenza A by PCR NEGATIVE NEGATIVE Final   Influenza B by PCR NEGATIVE NEGATIVE Final    Comment: (NOTE) The Xpert Xpress SARS-CoV-2/FLU/RSV assay is intended as an aid in  the diagnosis of influenza from Nasopharyngeal swab specimens and  should not be used as a sole basis for treatment. Nasal washings and  aspirates are unacceptable for Xpert Xpress  SARS-CoV-2/FLU/RSV  testing. Fact Sheet for Patients: PinkCheek.be Fact Sheet for Healthcare Providers: GravelBags.it This test is not yet approved or cleared by the [place] FDA and  has been authorized for detection and/or diagnosis of SARS-CoV-2 by  FDA under an Emergency Use Authorization (EUA). This EUA will remain  in effect (meaning this test can be used) for the duration of the  Covid-19 declaration under Section 564(b)(1) of the Act, 21  U.S.C. section 360bbb-3(b)(1), unless the authorization is  terminated or revoked. Performed at [facility], [place] [address] [place], [place] [address]   Urine culture     Status: None   Collection Time: 08/17/19 11:20 PM   Specimen: Urine, Clean Catch  Result Value Ref Range Status   Specimen Description URINE, CLEAN CATCH  Final   Special Requests NONE  Final   Culture   Final    NO GROWTH Performed at [facility], [place] [address], [place], [place] [address]    Report Status 08/18/2019 FINAL  Final  SARS CORONAVIRUS 2 (TAT 6-24 HRS) Nasopharyngeal Nasopharyngeal Swab     Status: None   Collection Time: 08/21/19 10:30 AM   Specimen: Nasopharyngeal Swab  Result Value Ref Range Status   SARS Coronavirus 2 NEGATIVE NEGATIVE Final    Comment: (NOTE) SARS-CoV-2 target nucleic acids are NOT DETECTED. The SARS-CoV-2 RNA is generally detectable in upper and lower respiratory specimens during the acute phase of infection. Negative results do not preclude SARS-CoV-2 infection, do not rule out co-infections with other  pathogens, and should not be used as the sole basis for treatment or other patient management decisions. Negative results must be combined with clinical observations, patient history, and epidemiological information. The expected result is Negative. Fact Sheet for Patients: SugarRoll.be Fact Sheet for Healthcare Providers: https://www.woods-mathews.com/ This test is not yet approved or cleared by the [place] FDA and  has been authorized for detection and/or diagnosis of SARS-CoV-2 by FDA under an Emergency Use Authorization (EUA). This EUA will remain  in effect (meaning this test can be used) for the duration of the COVID-19 declaration under Section 56 4(b)(1) of the Act, 21 U.S.C. section 360bbb-3(b)(1), unless the authorization is terminated or revoked sooner. Performed at [facility], [place] [address], [place], [place] [address]   Culture, blood (routine x 2)     Status: None (Preliminary result)   Collection Time: 08/21/19  8:23 PM   Specimen: BLOOD  Result Value Ref Range Status   Specimen Description BLOOD LEFT HAND  Final   Special Requests   Final    BOTTLES DRAWN AEROBIC ONLY Blood Culture results may not be optimal due to an inadequate volume of blood received in culture bottles   Culture   Final    NO GROWTH 2 DAYS Performed at [facility], [place] [address] [place], [place] [address]    Report Status PENDING  Incomplete  Culture, blood (routine x 2)     Status: None (Preliminary result)   Collection Time: 08/21/19  8:30 PM   Specimen: BLOOD  Result Value Ref Range Status   Specimen Description BLOOD LEFT HAND  Final   Special Requests   Final    BOTTLES DRAWN AEROBIC ONLY Blood Culture adequate volume   Culture   Final    NO GROWTH 2 DAYS Performed at [facility], [place] [address] [place], [place] [address]    Report Status PENDING  Incomplete  Labs: BNP (last 3 results) No results for  input(s): BNP in the last 8760 hours. Basic Metabolic Panel: Recent Labs  Lab 08/19/19 0422 08/20/19 0659 08/21/19 0750 08/23/19 0522 08/23/19 0854  NA 134* 136 135 137 131*  K 3.6 4.0 4.0 3.9 4.4  CL 98 100 98 103 103  CO2 28 25 25 23  21*  GLUCOSE 77 116* 212* 84 315*  BUN 17 14 21 18 15   CREATININE 1.28* 1.30* 1.63* 1.31* 1.28*  CALCIUM 9.1 9.5 9.4 9.4 8.9   Liver Function Tests: Recent Labs  Lab 08/17/19 2247 08/21/19 0750  AST 35 32  ALT 32 25  ALKPHOS 85 71  BILITOT 0.7 0.7  PROT 8.0 6.5  ALBUMIN 4.1 3.1*   No results for input(s): LIPASE, AMYLASE in the last 168 hours. Recent Labs  Lab 08/18/19 0828  AMMONIA 27   CBC: Recent Labs  Lab 08/17/19 2247 08/21/19 0750 08/23/19 0522  WBC 9.1 11.3* 8.2  NEUTROABS 8.2*  --   --   HGB 12.1 10.4* 11.3*  HCT 36.5 31.5* 34.4*  MCV 96.6 97.2 97.7  PLT 242 230 256   Cardiac Enzymes: No results for input(s): CKTOTAL, CKMB, CKMBINDEX, TROPONINI in the last 168 hours. BNP: Invalid input(s): POCBNP CBG: Recent Labs  Lab 08/22/19 1650 08/22/19 2129 08/23/19 0648 08/23/19 0807 08/23/19 1114  GLUCAP 310* 266* 51* 129* 295*   D-Dimer No results for input(s): DDIMER in the last 72 hours. Hgb A1c No results for input(s): HGBA1C in the last 72 hours. Lipid Profile Recent Labs    08/23/19 0522  CHOL 266*  HDL 118  LDLCALC 133*  TRIG 77  CHOLHDL 2.3   Thyroid function studies No results for input(s): TSH, T4TOTAL, T3FREE, THYROIDAB in the last 72 hours.  Invalid input(s): FREET3 Anemia work up No results for input(s): VITAMINB12, FOLATE, FERRITIN, TIBC, IRON, RETICCTPCT in the last 72 hours. Urinalysis    Component Value Date/Time   COLORURINE YELLOW 08/21/2019 1030   APPEARANCEUR CLEAR 08/21/2019 1030   LABSPEC 1.013 08/21/2019 1030   PHURINE 6.0 08/21/2019 1030   GLUCOSEU 150 (A) 08/21/2019 1030   HGBUR NEGATIVE 08/21/2019 1030   BILIRUBINUR NEGATIVE 08/21/2019 1030   BILIRUBINUR neg 05/06/2015  1421   KETONESUR 5 (A) 08/21/2019 1030   PROTEINUR 30 (A) 08/21/2019 1030   UROBILINOGEN 1.0 05/06/2015 1421   UROBILINOGEN 0.2 12/25/2014 1206   NITRITE NEGATIVE 08/21/2019 1030   LEUKOCYTESUR NEGATIVE 08/21/2019 1030   Sepsis Labs Invalid input(s): PROCALCITONIN,  WBC,  LACTICIDVEN Microbiology Recent Results (from the past 240 hour(s))  Blood culture (routine x 2)     Status: None   Collection Time: 08/17/19 12:36 AM   Specimen: BLOOD LEFT HAND  Result Value Ref Range Status   Specimen Description BLOOD LEFT HAND  Final   Special Requests   Final    BOTTLES DRAWN AEROBIC AND ANAEROBIC Blood Culture results may not be optimal due to an inadequate volume of blood received in culture bottles   Culture   Final    NO GROWTH 5 DAYS Performed at [facility], [place] [address], [place], [place] [address]    Report Status 08/23/2019 FINAL  Final  Blood culture (routine x 2)     Status: None   Collection Time: 08/17/19 12:37 AM   Specimen: BLOOD  Result Value Ref Range Status   Specimen Description BLOOD RIGHT ANTECUBITAL  Final   Special Requests   Final    BOTTLES DRAWN AEROBIC  AND ANAEROBIC Blood Culture results may not be optimal due to an inadequate volume of blood received in culture bottles   Culture   Final    NO GROWTH 5 DAYS Performed at [place] [address], [place], [place] [address]    Report Status 08/23/2019 FINAL  Final  Respiratory Panel by RT PCR (Flu A&B, Covid) - Nasopharyngeal Swab     Status: None   Collection Time: 08/17/19 11:15 PM   Specimen: Nasopharyngeal Swab  Result Value Ref Range Status   SARS Coronavirus 2 by RT PCR NEGATIVE NEGATIVE Final    Comment: (NOTE) SARS-CoV-2 target nucleic acids are NOT DETECTED. The SARS-CoV-2 RNA is generally detectable in upper respiratoy specimens during the acute phase of infection. The lowest concentration of SARS-CoV-2 viral copies this assay can detect is 131 copies/mL. A negative result does  not preclude SARS-Cov-2 infection and should not be used as the sole basis for treatment or other patient management decisions. A negative result may occur with  improper specimen collection/handling, submission of specimen other than nasopharyngeal swab, presence of viral mutation(s) within the areas targeted by this assay, and inadequate number of viral copies (<131 copies/mL). A negative result must be combined with clinical observations, patient history, and epidemiological information. The expected result is Negative. Fact Sheet for Patients:  PinkCheek.be Fact Sheet for Healthcare Providers:  GravelBags.it This test is not yet ap proved or cleared by the [place] FDA and  has been authorized for detection and/or diagnosis of SARS-CoV-2 by FDA under an Emergency Use Authorization (EUA). This EUA will remain  in effect (meaning this test can be used) for the duration of the COVID-19 declaration under Section 564(b)(1) of the Act, 21 U.S.C. section 360bbb-3(b)(1), unless the authorization is terminated or revoked sooner.    Influenza A by PCR NEGATIVE NEGATIVE Final   Influenza B by PCR NEGATIVE NEGATIVE Final    Comment: (NOTE) The Xpert Xpress SARS-CoV-2/FLU/RSV assay is intended as an aid in  the diagnosis of influenza from Nasopharyngeal swab specimens and  should not be used as a sole basis for treatment. Nasal washings and  aspirates are unacceptable for Xpert Xpress SARS-CoV-2/FLU/RSV  testing. Fact Sheet for Patients: PinkCheek.be Fact Sheet for Healthcare Providers: GravelBags.it This test is not yet approved or cleared by the [place] FDA and  has been authorized for detection and/or diagnosis of SARS-CoV-2 by  FDA under an Emergency Use Authorization (EUA). This EUA will remain  in effect (meaning this test can be used) for the duration of the   Covid-19 declaration under Section 564(b)(1) of the Act, 21  U.S.C. section 360bbb-3(b)(1), unless the authorization is  terminated or revoked. Performed at [facility], [place] [address] [place], [place] [address]   Urine culture     Status: None   Collection Time: 08/17/19 11:20 PM   Specimen: Urine, Clean Catch  Result Value Ref Range Status   Specimen Description URINE, CLEAN CATCH  Final   Special Requests NONE  Final   Culture   Final    NO GROWTH Performed at [facility], [place] [address] [place],  [address]    Report Status 08/18/2019 FINAL  Final  SARS CORONAVIRUS 2 (TAT 6-24 HRS) Nasopharyngeal Nasopharyngeal Swab     Status: None   Collection Time: 08/21/19 10:30 AM   Specimen: Nasopharyngeal Swab  Result Value Ref Range Status   SARS Coronavirus 2 NEGATIVE NEGATIVE Final    Comment: (NOTE)  SARS-CoV-2 target nucleic acids are NOT DETECTED. The SARS-CoV-2 RNA is generally detectable in upper and lower respiratory specimens during the acute phase of infection. Negative results do not preclude SARS-CoV-2 infection, do not rule out co-infections with other pathogens, and should not be used as the sole basis for treatment or other patient management decisions. Negative results must be combined with clinical observations, patient history, and epidemiological information. The expected result is Negative. Fact Sheet for Patients: SugarRoll.be Fact Sheet for Healthcare Providers: https://www.woods-mathews.com/ This test is not yet approved or cleared by the [place] FDA and  has been authorized for detection and/or diagnosis of SARS-CoV-2 by FDA under an Emergency Use Authorization (EUA). This EUA will remain  in effect (meaning this test can be used) for the duration of the COVID-19 declaration under Section 56 4(b)(1) of the Act, 21 U.S.C. section 360bbb-3(b)(1), unless the authorization is terminated  or revoked sooner. Performed at [facility], [place] [address], [place], [place] [address]   Culture, blood (routine x 2)     Status: None (Preliminary result)   Collection Time: 08/21/19  8:23 PM   Specimen: BLOOD  Result Value Ref Range Status   Specimen Description BLOOD LEFT HAND  Final   Special Requests   Final    BOTTLES DRAWN AEROBIC ONLY Blood Culture results may not be optimal due to an inadequate volume of blood received in culture bottles   Culture   Final    NO GROWTH 2 DAYS Performed at [facility], [place] [address], [place], [place] [address]    Report Status PENDING  Incomplete  Culture, blood (routine x 2)     Status: None (Preliminary result)   Collection Time: 08/21/19  8:30 PM   Specimen: BLOOD  Result Value Ref Range Status   Specimen Description BLOOD LEFT HAND  Final   Special Requests   Final    BOTTLES DRAWN AEROBIC ONLY Blood Culture adequate volume   Culture   Final    NO GROWTH 2 DAYS Performed at [facility], [place] [address] [place], [place] [address]    Report Status PENDING  Incomplete     Time coordinating discharge: Over 30 minutes  SIGNED:   Darliss Cheney, MD  [facility] 08/23/2019, 3:14 PM  If 7PM-7AM, please contact night-coverage [email or web address]

## 2019-08-23 NOTE — Progress Notes (Signed)
OT Cancellation Note  Patient Details Name: [name] [name] MRN: [id number] DOB: Nov 28, 1933   Cancelled Treatment:    Reason Eval/Treat Not Completed: Patient at procedure or test/ unavailable (noted pt in cath lab). Will follow up for OT treatment as able.  [name] [name], OT [facility] Pager [phone] Office [phone]   [name] [name] 08/23/2019, 1:20 PM

## 2019-08-23 NOTE — Consult Note (Addendum)
ELECTROPHYSIOLOGY CONSULT NOTE  Patient ID: [name] [name] MRN: [id number], DOB/AGE: 84-24-1935   Admit date: 08/21/2019 Date of Consult: 08/23/2019  Primary Physician: Lajean Manes, MD Primary Cardiologist: none Reason for Consultation: Cryptogenic TIA ; recommendations regarding Implantable Loop Recorder reauested by Dr. Leonie Man  History of Present Illness [name] [name] was admitted on 08/21/2019 with sudden onset weakness, garbled speech, syncope.      She was just discharged 08/20/2019 where she was admitted for AMS, found with an enterocolitis, AMS suspect to be perhaps a reaction to a new OTC herbal remedy.  She was treated with antibiotics for the entrocolitis and instructed to avoid the new OTC herbal therapy.  She returned 08/21/2019 with new onset weakness, associated with dizziness, diaphoresis and "babbling, and reports of syncope when EMS arrived, waking upon getting into the ambulance apparently and they appreciated slurred speech and R facial droop that resolved by arrival to ER  PMHx noted for DM, HTN, HLD, CKD (III), hypothyroidism, Chronic back pain, PVD  Neurology reports r/o for stroke, and feel was a L brain TIA.  They have high suspicion of AFib and recommended consultation for loop vs EM for AFib surveillence.   she has undergone workup for stroke including echocardiogram and carotid dopplers.  The patient has been monitored on telemetry which has demonstrated sinus rhythm with no arrhythmias.   Neurology has deferred TEE    Echocardiogram this admission demonstrated    IMPRESSIONS  1. Left ventricular ejection fraction, by visual estimation, is 60 to  65%. The left ventricle has normal function. Left ventricular septal wall  thickness was mildly increased. Mildly increased left ventricular  posterior wall thickness. There is no left  ventricular hypertrophy.  2. Elevated left ventricular end-diastolic pressure.  3. Left ventricular diastolic parameters are  indeterminate.  4. The left ventricle has no regional wall motion abnormalities.  5. Global right ventricle has normal systolic function.The right  ventricular size is normal. No increase in right ventricular wall  thickness.  6. Left atrial size was normal.  7. Right atrial size was normal.  8. The mitral valve is normal in structure. Trivial mitral valve  regurgitation. No evidence of mitral stenosis.  9. The tricuspid valve is normal in structure. Tricuspid valve  regurgitation is mild.  10. The aortic valve is normal in structure. Aortic valve regurgitation is  not visualized. No evidence of aortic valve sclerosis or stenosis.  11. The pulmonic valve was normal in structure. Pulmonic valve  regurgitation is not visualized.  12. Normal pulmonary artery systolic pressure.  13. The inferior vena cava is dilated in size with >50% respiratory  variability, suggesting right atrial pressure of 8 mmHg.    Lab work is reviewed.   Prior to admission, the patient denies chest pain, shortness of breath, dizziness, palpitations, or syncope.  They are recovering from their stroke with plans to home at discharge.    Note that her CT abdomen done last week found incidentally  Likely near complete occlusion of the proximal left common iliac artery She denies pain to either LE, no symptoms of claudication   Past Medical History:  Diagnosis Date  . Arthritis    "knees, legs" (06/10/2016)  . Ascites   . Chronic kidney disease    "related to my diabetes"  . Chronic lower back pain   . Diabetes mellitus without complication ([place])   . Eczema   . GERD (gastroesophageal reflux disease)   . High cholesterol   .  Hyperlipidemia   . Hypertension   . Hypothyroidism   . OAB (overactive bladder)   . Osteoporosis   . Thyroid disease   . Type II diabetes mellitus ([place])   . Urticaria      Surgical History:  Past Surgical History:  Procedure Laterality Date  . ABDOMINAL HYSTERECTOMY      . KNEE ARTHROSCOPY    . LAPAROTOMY N/A 12/03/2016   Procedure: EXPLORATORY LAPAROTOMY, LYSIS OF ADHESIONS;  Surgeon: Armandina Gemma, MD;  Location: [facility];  Service: General;  Laterality: N/A;  . vocal cord polyps       Medications Prior to Admission  Medication Sig Dispense Refill Last Dose  . acetaminophen (TYLENOL) 500 MG tablet Take 500 mg by mouth every 6 (six) hours as needed for mild pain.      Marland Kitchen alendronate (FOSAMAX) 70 MG tablet TAKE 1 TABLET BY MOUTH EVERY 7 DAYS ON AN EMPTY STOMACH WITH A FULL GLASS OF WATER (Patient taking differently: Take 70 mg by mouth once a week. Pt takes Wednesday of each week) 12 tablet 0   . aspirin EC 81 MG tablet Take 81 mg by mouth daily.     Marland Kitchen atorvastatin (LIPITOR) 40 MG tablet Take 40 mg by mouth daily at 6 PM.      . B-D UF III MINI PEN NEEDLES 31G X 5 MM MISC USE AS DIRECTED TID     . calcium-vitamin D 250-100 MG-UNIT tablet Take 1 tablet by mouth daily.      . cholecalciferol (VITAMIN D) 1000 units tablet Take 1,000 Units by mouth daily.     . ciprofloxacin (CIPRO) 250 MG tablet Take 1 tablet (250 mg total) by mouth 2 (two) times daily. 6 tablet 0   . FLUAD 0.5 ML SUSY ADM 0.5ML IM UTD  0   . insulin aspart (NOVOLOG) 100 UNIT/ML injection Inject 6-8 Units into the skin 3 (three) times daily before meals. SLIDING SCALE: 70-199= 6 UNITS 200-299= 7 UNITS >300= 8 UNITS     . Insulin Detemir (LEVEMIR FLEXTOUCH) 100 UNIT/ML Pen Inject 7 Units into the skin 2 (two) times daily.      . Iron-FA-B Cmp-C-Biot-Probiotic (FUSION PLUS) CAPS Take 1 capsule by mouth daily.      Marland Kitchen levothyroxine (SYNTHROID, LEVOTHROID) 100 MCG tablet Take 100 mcg by mouth daily before breakfast.     . losartan (COZAAR) 50 MG tablet Take 50 mg by mouth daily.      . metroNIDAZOLE (FLAGYL) 500 MG tablet Take 1 tablet (500 mg total) by mouth every 8 (eight) hours. 9 tablet 0   . mometasone (NASONEX) 50 MCG/ACT nasal spray Place 2 sprays into the nose daily as needed (nasal  congestion). 17 g 5   . Multiple Vitamin (MULTIVITAMIN WITH MINERALS) TABS tablet Take 1 tablet by mouth daily.     . Omega-3 Fatty Acids (FISH OIL) 1000 MG CAPS Take 1,000 mg by mouth 2 (two) times daily.     . pantoprazole (PROTONIX) 40 MG tablet Take 40 mg by mouth daily.        Inpatient Medications:  .  stroke: mapping our early stages of recovery book   Does not apply Once  . acetaminophen  1,000 mg Oral TID  . aspirin EC  81 mg Oral Daily  . atorvastatin  40 mg Oral q1800  . clopidogrel  75 mg Oral Daily  . diclofenac Sodium  2 g Topical QID  . enoxaparin (LOVENOX) injection  30 mg Subcutaneous Q24H  .  insulin aspart  0-5 Units Subcutaneous QHS  . insulin aspart  0-9 Units Subcutaneous TID WC  . insulin detemir  7 Units Subcutaneous BID  . levothyroxine  100 mcg Oral QAC breakfast  . pantoprazole  40 mg Oral Daily    Allergies:  Allergies  Allergen Reactions  . Sulfonamide Derivatives Hives  . Bactrim [Sulfamethoxazole-Trimethoprim] Rash    Reported by [facility]  . Lisinopril Cough    Reported by [facility]    Social History   Socioeconomic History  . Marital status: Divorced    Spouse name: Not on file  . Number of children: Not on file  . Years of education: Not on file  . Highest education level: Not on file  Occupational History  . Not on file  Tobacco Use  . Smoking status: Never Smoker  . Smokeless tobacco: Former Systems developer    Types: Chew  Substance and Sexual Activity  . Alcohol use: No  . Drug use: No  . Sexual activity: Never  Other Topics Concern  . Not on file  Social History Narrative   ** Merged History Encounter **       Social Determinants of Health   Financial Resource Strain:   . Difficulty of Paying Living Expenses: Not on file  Food Insecurity:   . Worried About Charity fundraiser in the Last Year: Not on file  . Ran Out of Food in the Last Year: Not on file  Transportation Needs:   . Lack of Transportation (Medical):  Not on file  . Lack of Transportation (Non-Medical): Not on file  Physical Activity:   . Days of Exercise per Week: Not on file  . Minutes of Exercise per Session: Not on file  Stress:   . Feeling of Stress : Not on file  Social Connections:   . Frequency of Communication with Friends and Family: Not on file  . Frequency of Social Gatherings with Friends and Family: Not on file  . Attends Religious Services: Not on file  . Active Member of Clubs or Organizations: Not on file  . Attends Archivist Meetings: Not on file  . Marital Status: Not on file  Intimate Partner Violence:   . Fear of Current or Ex-Partner: Not on file  . Emotionally Abused: Not on file  . Physically Abused: Not on file  . Sexually Abused: Not on file     Family History  Problem Relation Age of Onset  . Diabetes Mother   . Hypertension Mother   . Diabetes Sister   . Diabetes Brother   . Asthma Father   . Allergic rhinitis Neg Hx   . Eczema Neg Hx   . Urticaria Neg Hx       Review of Systems: All other systems reviewed and are otherwise negative except as noted above.  Physical Exam: Vitals:   08/22/19 1649 08/22/19 2127 08/23/19 0431 08/23/19 1013  BP: (!) 162/66 (!) 148/58 (!) 169/56 (!) 164/66  Pulse: 60 63 65 69  Resp: 18 18 16 18   Temp: 98 F (36.7 C) 98.4 F (36.9 C) 98 F (36.7 C) 98.6 F (37 C)  TempSrc: Oral Oral Oral Oral  SpO2: 100%  100% 100%  Weight:   63.1 kg     GEN- The patient is well appearing, alert and oriented x 3 today.   Head- normocephalic, atraumatic Eyes-  Sclera clear, conjunctiva pink Ears- hearing intact Oropharynx- clear Neck- supple Lungs- CTA b/l, normal work of  breathing Heart- RRR, no murmurs, rubs or gallops  Vasc: no palpable pedal pulses, + doppler PT/DP pulses, no wounds or evidence of cyanosis GI- soft, NT, ND Extremities- no clubbing, cyanosis, or edema MS- no significant deformity, age appropriate atrophy Skin- no rash or  lesion Psych- euthymic mood, full affect   Labs:   Lab Results  Component Value Date   WBC 8.2 08/23/2019   HGB 11.3 (L) 08/23/2019   HCT 34.4 (L) 08/23/2019   MCV 97.7 08/23/2019   PLT 256 08/23/2019    Recent Labs  Lab 08/21/19 0750 08/23/19 0522 08/23/19 0854  NA 135   < > 131*  K 4.0   < > 4.4  CL 98   < > 103  CO2 25   < > 21*  BUN 21   < > 15  CREATININE 1.63*   < > 1.28*  CALCIUM 9.4   < > 8.9  PROT 6.5  --   --   BILITOT 0.7  --   --   ALKPHOS 71  --   --   ALT 25  --   --   AST 32  --   --   GLUCOSE 212*   < > 315*   < > = values in this interval not displayed.   Lab Results  Component Value Date   TROPONINI 0.44 (HH) 11/26/2016   Lab Results  Component Value Date   CHOL 266 (H) 08/23/2019   CHOL 187 02/02/2008   CHOL 210 (HH) 01/28/2007   Lab Results  Component Value Date   HDL 118 08/23/2019   HDL 90.6 02/02/2008   HDL 91.4 01/28/2007   Lab Results  Component Value Date   LDLCALC 133 (H) 08/23/2019   LDLCALC 82 02/02/2008   Lab Results  Component Value Date   TRIG 77 08/23/2019   TRIG 74 02/02/2008   TRIG 76 01/28/2007   Lab Results  Component Value Date   CHOLHDL 2.3 08/23/2019   CHOLHDL 2.1 CALC 02/02/2008   CHOLHDL 2.3 CALC 01/28/2007   Lab Results  Component Value Date   LDLDIRECT 101.2 01/28/2007    No results found for: DDIMER   Radiology/Studies:   EEG Result Date: 08/21/2019 Lora Havens, MD     08/21/2019  4:17 PM Patient Name: BERINA GIESEKING MRN: [id number] Epilepsy Attending: Lora Havens Referring Physician/Provider: Dr. Fuller Plan Date: 08/21/2019 Duration: 23.28 minutes Patient history:  86 year-old female who presented with altered mental status, garbled speech as well as right gaze preference and right facial droop transiently.  EEG to evaluate for seizure. Level of alertness: Awake, asleep AEDs during EEG study: None Technical aspects: This EEG study was done with scalp electrodes positioned according to  the 10-20 International system of electrode placement. Electrical activity was acquired at a sampling rate of 500Hz  and reviewed with a high frequency filter of 70Hz  and a low frequency filter of 1Hz . EEG data were recorded continuously and digitally stored. Description:  The posterior dominant rhythm consists of 8 Hz activity of moderate voltage (25-35 uV) seen predominantly in posterior head regions, symmetric and reactive to eye opening and eye closing.  Sleep was characterized by intermittent rhythmic 2-4Hz  theta- delta slowing. Hyperventilation and photic stimulation were not performed. IMPRESSION: This study is within normal limits. No seizures or epileptiform discharges were seen throughout the recording. Lora Havens       CT HEAD WO CONTRAST Result Date: 08/21/2019 CLINICAL DATA:  Transit ischemic attack.  RIGHT-sided deficit. EXAM: CT HEAD WITHOUT CONTRAST TECHNIQUE: Contiguous axial images were obtained from the base of the skull through the vertex without intravenous contrast. COMPARISON:  None. FINDINGS: Brain: No acute intracranial hemorrhage. No focal mass lesion. No CT evidence of acute infarction. No midline shift or mass effect. No hydrocephalus. Basilar cisterns are patent. There are periventricular and subcortical white matter hypodensities. Generalized cortical atrophy. Vascular: No hyperdense vessel or unexpected calcification. Skull: Normal. Negative for fracture or focal lesion. Sinuses/Orbits: Paranasal sinuses and mastoid air cells are clear. Orbits are clear. Other: None. IMPRESSION: 1. No acute intracranial findings.  No change from prior. 2. Atrophy and white matter microvascular disease. Electronically Signed   By: Suzy Bouchard M.D.   On: 08/21/2019 08:26      MR ANGIO HEAD WO CONTRAST Result Date: 08/21/2019 CLINICAL DATA:  Slurred speech and lower extremity weakness EXAM: MRI HEAD WITHOUT CONTRAST MRA HEAD WITHOUT CONTRAST TECHNIQUE: Multiplanar, multiecho pulse  sequences of the brain and surrounding structures were obtained without intravenous contrast. Angiographic images of the head were obtained using MRA technique without contrast. COMPARISON:  None. FINDINGS: MRI HEAD FINDINGS BRAIN: No acute infarct, acute hemorrhage or extra-axial collection. Multifocal white matter hyperintensity, most commonly due to chronic ischemic microangiopathy. There is generalized atrophy without lobar predilection. Midline structures are normal. VASCULAR: Major flow voids are preserved. Susceptibility-sensitive sequences show no chronic microhemorrhage or superficial siderosis. SKULL AND UPPER CERVICAL SPINE: Normal calvarium and skull base. Visualized upper cervical spine and soft tissues are normal. SINUSES/ORBITS: No fluid levels or advanced mucosal thickening. No mastoid or middle ear effusion. Normal orbits. MRA HEAD FINDINGS POSTERIOR CIRCULATION: --Vertebral arteries: Normal V4 segments. --Posterior inferior cerebellar arteries (PICA): Patent origins from the vertebral arteries. --Anterior inferior cerebellar arteries (AICA): Patent origins from the basilar artery. --Basilar artery: Normal. --Superior cerebellar arteries: Normal. --Posterior cerebral arteries: Normal. The right PCA is partially supplied by a posterior communicating artery (p-comm). ANTERIOR CIRCULATION: --Intracranial internal carotid arteries: Normal. --Anterior cerebral arteries (ACA): Normal. Both A1 segments are present. Patent anterior communicating artery (a-comm). --Middle cerebral arteries (MCA): Normal. IMPRESSION: 1. No acute intracranial process. 2. Generalized atrophy and chronic ischemic microangiopathy. 3. Normal intracranial MRA. Electronically Signed   By: Ulyses Jarred M.D.   On: 08/21/2019 20:13     CT ABDOMEN PELVIS W CONTRAST Result Date: 08/18/2019 CLINICAL DATA:  Bowel obstruction suspected EXAM: CT ABDOMEN AND PELVIS WITH CONTRAST TECHNIQUE: Multidetector CT imaging of the abdomen and  pelvis was performed using the standard protocol following bolus administration of intravenous contrast. CONTRAST:  160mL OMNIPAQUE IOHEXOL 300 MG/ML  SOLN COMPARISON:  CT abdomen pelvis Dec 01, 2016 FINDINGS: Lower chest: Lung bases are clear. Normal heart size. No pericardial effusion. Coronary artery calcifications are noted. Hepatobiliary: Diffuse hepatic hypoattenuation compatible with hepatic steatosis. No focal liver abnormality is seen. Gallbladder is decompressed. Radiodensity at the gallbladder fossa is in similar position to prior though difficult to fully visualize given extensive respiratory motion artifact in the upper abdomen. No gross biliary ductal dilatation is seen. Pancreas: Unremarkable. No pancreatic ductal dilatation or surrounding inflammatory changes. Spleen: Normal in size without focal abnormality. Adrenals/Urinary Tract: Normal adrenal glands. 2.3 cm hypoattenuating cyst seen in the lower pole left kidney. No concerning renal lesions. No visible urolithiasis or hydronephrosis. Circumferential bladder wall thickening with some adjacent perivesicular haze. Stomach/Bowel: Distal esophagus, stomach and duodenum are unremarkable. Much of the small bowel is decompressed. The bowel is difficult to fully assess given extensive motion artifact and a paucity  of intraperitoneal fat though question several loops of small bowel in the mid abdomen which appear slightly thickened for example coronal series 6 image 63. no evidence of bowel obstruction is seen however. The appendix is not well visualized. No focal pericecal inflammation to suggest an occult appendicitis. Moderate volume of stool throughout the colon. Mild pericolonic edematous changes are noted diffusely. Vascular/Lymphatic: Extensive atherosclerotic plaque is seen throughout the aorta and branch vessels. Question near complete occlusion of the proximal left common iliac artery. No aneurysm or ectasia. Reproductive: Uterus is surgically  absent. No concerning adnexal lesions. Other: Increased attenuation of mesenteric, may be reactive stranding or related to hydration status. No free air or free fluid is seen. No bowel containing hernias. Postsurgical changes of the low anterior abdominal wall. Musculoskeletal: Multilevel degenerative changes are present in the imaged portions of the spine. No acute osseous abnormality or suspicious osseous lesion. The osseous structures appear diffusely demineralized which may limit detection of small or nondisplaced fractures. IMPRESSION: 1. Much of the small bowel is decompressed though question several loops of small bowel in the mid abdomen appear slightly thickened. Additional faint pericolonic hazy stranding is noted as well. Findings could reflect an infectious or inflammatory enterocolitis. No evidence of bowel obstruction is seen however. 2. Circumferential bladder wall thickening with some adjacent perivesicular haze. Correlate with urinalysis to exclude underlying cystitis. 3. Hepatic steatosis. 4. Extensive atherosclerotic plaque throughout the aorta and branch vessels. Likely near complete occlusion of the proximal left common iliac artery. Electronically Signed   By: Lovena Le M.D.   On: 08/18/2019 03:14     VAS US CAROTID Result Date: 08/22/2019 Carotid Arterial Duplex Study Indications:       TIA. Risk Factors:      Hypertension, hyperlipidemia, Diabetes. Comparison Study:  no prior Performing Technologist: Abram Sander RVS  Examination Guidelines: A complete evaluation includes B-mode imaging, spectral Doppler, color Doppler, and power Doppler as needed of all accessible portions of each vessel. Bilateral testing is considered an integral part of a complete examination. Limited examinations for reoccurring indications may be performed as noted.  Right Carotid  Summary: Right Carotid: Velocities in the right ICA are consistent with a 1-39% stenosis. Left Carotid: Velocities in the left ICA  are consistent with a 1-39% stenosis. Vertebrals: Bilateral vertebral arteries demonstrate antegrade flow. *See table(s) above for measurements and observations.  Electronically signed by Antony Contras MD on 08/22/2019 at 1:06:36 PM.    Final     12-lead ECG SR All prior EKG's in EPIC reviewed with no documented atrial fibrillation  Telemetry SR    Assessment and Plan:  1. Cryptogenic stroke The patient presents with cryptogenic stroke.  Dr. Sherin Quarry at length with the patient about monitoring for afib with either a 30 day event monitor or an implantable loop recorder with the patient and daughter at bedside.  Risks, benefits, and alteratives to implantable loop recorder were discussed with the patient today.  They have no follow up questions at the time of my visit and are very clear in their decision to proceed with implantable loop recorder.   Wound care was reviewed with the patient (keep incision clean and dry for 3 days).  Wound check will be  scheduled for the patient  Incidental note of PVD, I did go back and see her again, she has doppler pedal pulses, no symptoms of claudication reported.  she has seen vascular, Dr. Donnetta Hutching in the past, recommend she had follow up again.   When  I went back to check her feet/pulses daughter had left the room.  Discussed with the patient need for follow up.  Please call with questions.  Renee Dyane Dustman, PA-C 08/23/2019   EP Attending  Patient seen and examined. Agree with the findings as noted above. The patient presents with a TIA and we are asked to evaluate for possible ILR insertion. I discussed the rationale, risks/benefits/goals/expectations of ILR insertion with the patient and 2 of her daughters and the indications/risks/benefits/goals/expectations of the procedure were reviewed and she wishes to proceed.  Mikle Bosworth.D.

## 2019-08-23 NOTE — Progress Notes (Signed)
STROKE TEAM PROGRESS NOTE   INTERVAL HISTORY Patient sitting up in bed.  She feels she has recovered except right shoulder weakness which is at baseline from chronic shoulder problems. She is on antibiotics for enterocolitis.Await cardiology consult for loop versus 30 day monitor  Vitals:   08/22/19 1649 08/22/19 2127 08/23/19 0431 08/23/19 1013  BP: (!) 162/66 (!) 148/58 (!) 169/56 (!) 164/66  Pulse: 60 63 65 69  Resp: 18 18 16 18   Temp: 98 F (36.7 C) 98.4 F (36.9 C) 98 F (36.7 C) 98.6 F (37 C)  TempSrc: Oral Oral Oral Oral  SpO2: 100%  100% 100%  Weight:   63.1 kg     PHYSICAL EXAM [name] not in distress.  Right shoulder elevation is limited due to mechanical pain. . Afebrile. Head is nontraumatic. Neck is supple without bruit.    Cardiac exam no murmur or gallop. Lungs are clear to auscultation. Distal pulses are well felt. Neurological Exam ;  Awake  Alert oriented x 3. Normal speech and language.eye movements full without nystagmus.fundi were not visualized. Vision acuity and fields appear normal. Hearing is normal. Palatal movements are normal. Face symmetric. Tongue midline. Normal strength, tone, reflexes and coordination.  Except right shoulder elevation limited due to mechanical pain.  Normal sensation. Gait deferred.  ASSESSMENT/PLAN [name] [name] is a 86 y.o. female with history of diabetes, thyroid disease, hypertension, hyperlipidemia, chronic kidney disease presenting with R arm weakness, with diaphoretic episode and passed out.   L brain TIA  CT head No acute abnormality. Small vessel disease. Atrophy.   MRI  No acute stroke, Small vessel disease. Atrophy.   MRA  Unremarkable   Carotid Doppler  B ICA 1-39% stenosis, VAs antegrade   2D Echo EF 60-65-%. No source of embolus   EEG normal, no sz   LDL 133 mg%   HgbA1c 8.6  Lovenox 30 mg sq daily for VTE prophylaxis  aspirin 81 mg daily prior to admission, now on  aspirin 325 mg daily. Will add plavix and decrease aspirin to 81. Continue DAPT x 3 weeks then PLAVIX alone   Highly suspicious for atrial fibrillation as source of stroke. Recommend monitoring  to look for AF as possible source of TIA. Dr. Leonie Man spoke with Dr. Eliseo Squires who will consult EP for loop vs 30d monitor      Therapy recommendations:  OP OT, no PT  Disposition:  pending (patient lives alone PTA, dtrs live nearby and check on frequently)  Hypertension Possible orthostatic hypotension  Stable . BP goal normotensive  Hyperlipidemia  Home meds:  lipitor 40, fish oil, lipitor resumed in hospital  LDL pending, goal < 70  Continue statin at discharge  Diabetes type II Uncontrolled  HgbA1c 8.6, goal < 7.0 Recent Labs    08/23/19 0648 08/23/19 0807 08/23/19 1114  GLUCAP 51* 80* 23*    DB RN following  Other Stroke Risk Factors  Advanced age  Former smokeless tobacco user, quit 36 yrs ago  Other Active Problems  Acute leukocytosis  Enterocolitis  AKI on CKD 1.3->1.6  Normocytic anemia  Hypothyroidism   GERD  Leukocytosis 9.1->11.3   Hospital day # 0     She presented with generalized weakness and some slurred speech possibly from posterior circulation TIA but also had a episode of brief syncopal event which raises concern for underlying cardiac arrhythmia and recommend outpatient cardiac monitoring for A. fib.  Continue   antiplatelet therapy and aggressive risk factor  modification.  Long discussion with patient and Dr Doristine Bosworth and answered questions. Await cardiology consult for loop versus 30 day monitor  Greater than 50% time during this 25-minute visit was spent on counseling and coordination of care and discussion with care team and planning evaluation and treatment Stroke team will sign off. Follow up in stroke clinic in 6 weeks with Franky Macho, NP Antony Contras, MD Medical Director [place] Pager: [phone] 08/23/2019 2:14  PM   To contact Stroke Continuity provider, please refer to [email or web address] After hours, contact General Neurology

## 2019-08-23 NOTE — Plan of Care (Signed)
  Problem: Activity: Goal: Risk for activity intolerance will decrease Outcome: Progressing   

## 2019-08-26 LAB — CULTURE, BLOOD (ROUTINE X 2)
Culture: NO GROWTH
Culture: NO GROWTH
Special Requests: ADEQUATE

## 2019-08-31 ENCOUNTER — Other Ambulatory Visit: Payer: Self-pay

## 2019-08-31 ENCOUNTER — Ambulatory Visit (INDEPENDENT_AMBULATORY_CARE_PROVIDER_SITE_OTHER): Payer: Medicare HMO | Admitting: *Deleted

## 2019-08-31 DIAGNOSIS — I6389 Other cerebral infarction: Secondary | ICD-10-CM

## 2019-08-31 LAB — CUP PACEART INCLINIC DEVICE CHECK
Date Time Interrogation Session: 20210211122042
Implantable Pulse Generator Implant Date: 20210203

## 2019-08-31 NOTE — Progress Notes (Signed)
ILR wound check in clinic. Steri strips removed. Wound well healed. Home monitor transmitting nightly. No episodes. Questions answered. p wave 1.33 mV

## 2019-09-07 DIAGNOSIS — E1165 Type 2 diabetes mellitus with hyperglycemia: Secondary | ICD-10-CM | POA: Diagnosis not present

## 2019-09-07 DIAGNOSIS — Z794 Long term (current) use of insulin: Secondary | ICD-10-CM | POA: Diagnosis not present

## 2019-09-07 DIAGNOSIS — E1140 Type 2 diabetes mellitus with diabetic neuropathy, unspecified: Secondary | ICD-10-CM | POA: Diagnosis not present

## 2019-09-07 DIAGNOSIS — E119 Type 2 diabetes mellitus without complications: Secondary | ICD-10-CM | POA: Diagnosis not present

## 2019-09-11 DIAGNOSIS — E1121 Type 2 diabetes mellitus with diabetic nephropathy: Secondary | ICD-10-CM | POA: Diagnosis not present

## 2019-09-11 DIAGNOSIS — I700 Atherosclerosis of aorta: Secondary | ICD-10-CM | POA: Diagnosis not present

## 2019-09-11 DIAGNOSIS — E039 Hypothyroidism, unspecified: Secondary | ICD-10-CM | POA: Diagnosis not present

## 2019-09-11 DIAGNOSIS — N1832 Chronic kidney disease, stage 3b: Secondary | ICD-10-CM | POA: Diagnosis not present

## 2019-09-11 DIAGNOSIS — I129 Hypertensive chronic kidney disease with stage 1 through stage 4 chronic kidney disease, or unspecified chronic kidney disease: Secondary | ICD-10-CM | POA: Diagnosis not present

## 2019-09-11 DIAGNOSIS — Z0000 Encounter for general adult medical examination without abnormal findings: Secondary | ICD-10-CM | POA: Diagnosis not present

## 2019-09-11 DIAGNOSIS — Z1389 Encounter for screening for other disorder: Secondary | ICD-10-CM | POA: Diagnosis not present

## 2019-09-11 DIAGNOSIS — H903 Sensorineural hearing loss, bilateral: Secondary | ICD-10-CM | POA: Diagnosis not present

## 2019-09-11 DIAGNOSIS — D539 Nutritional anemia, unspecified: Secondary | ICD-10-CM | POA: Diagnosis not present

## 2019-09-11 DIAGNOSIS — Z79899 Other long term (current) drug therapy: Secondary | ICD-10-CM | POA: Diagnosis not present

## 2019-09-11 DIAGNOSIS — E1142 Type 2 diabetes mellitus with diabetic polyneuropathy: Secondary | ICD-10-CM | POA: Diagnosis not present

## 2019-09-11 DIAGNOSIS — E782 Mixed hyperlipidemia: Secondary | ICD-10-CM | POA: Diagnosis not present

## 2019-09-13 ENCOUNTER — Other Ambulatory Visit: Payer: Self-pay | Admitting: Geriatric Medicine

## 2019-09-13 DIAGNOSIS — Z8679 Personal history of other diseases of the circulatory system: Secondary | ICD-10-CM

## 2019-09-14 DIAGNOSIS — I129 Hypertensive chronic kidney disease with stage 1 through stage 4 chronic kidney disease, or unspecified chronic kidney disease: Secondary | ICD-10-CM | POA: Diagnosis not present

## 2019-09-14 DIAGNOSIS — G459 Transient cerebral ischemic attack, unspecified: Secondary | ICD-10-CM | POA: Diagnosis not present

## 2019-09-14 DIAGNOSIS — M810 Age-related osteoporosis without current pathological fracture: Secondary | ICD-10-CM | POA: Diagnosis not present

## 2019-09-14 DIAGNOSIS — E1121 Type 2 diabetes mellitus with diabetic nephropathy: Secondary | ICD-10-CM | POA: Diagnosis not present

## 2019-09-14 DIAGNOSIS — E039 Hypothyroidism, unspecified: Secondary | ICD-10-CM | POA: Diagnosis not present

## 2019-09-14 DIAGNOSIS — E1142 Type 2 diabetes mellitus with diabetic polyneuropathy: Secondary | ICD-10-CM | POA: Diagnosis not present

## 2019-09-14 DIAGNOSIS — E782 Mixed hyperlipidemia: Secondary | ICD-10-CM | POA: Diagnosis not present

## 2019-09-14 DIAGNOSIS — E1151 Type 2 diabetes mellitus with diabetic peripheral angiopathy without gangrene: Secondary | ICD-10-CM | POA: Diagnosis not present

## 2019-09-15 ENCOUNTER — Ambulatory Visit
Admission: RE | Admit: 2019-09-15 | Discharge: 2019-09-15 | Disposition: A | Discharge status: Home / Self Care | Payer: Medicare HMO | Source: Ambulatory Visit | Attending: Geriatric Medicine | Admitting: Geriatric Medicine

## 2019-09-15 DIAGNOSIS — Z8679 Personal history of other diseases of the circulatory system: Secondary | ICD-10-CM

## 2019-09-15 DIAGNOSIS — I70202 Unspecified atherosclerosis of native arteries of extremities, left leg: Secondary | ICD-10-CM | POA: Diagnosis not present

## 2019-09-15 DIAGNOSIS — I70201 Unspecified atherosclerosis of native arteries of extremities, right leg: Secondary | ICD-10-CM | POA: Diagnosis not present

## 2019-09-22 DIAGNOSIS — H903 Sensorineural hearing loss, bilateral: Secondary | ICD-10-CM | POA: Diagnosis not present

## 2019-09-25 ENCOUNTER — Ambulatory Visit (INDEPENDENT_AMBULATORY_CARE_PROVIDER_SITE_OTHER): Payer: Medicare HMO | Admitting: *Deleted

## 2019-09-25 ENCOUNTER — Ambulatory Visit: Payer: Medicare HMO

## 2019-09-25 DIAGNOSIS — I6389 Other cerebral infarction: Secondary | ICD-10-CM | POA: Diagnosis not present

## 2019-09-25 LAB — CUP PACEART REMOTE DEVICE CHECK
Date Time Interrogation Session: 20210307144452
Implantable Pulse Generator Implant Date: 20210203

## 2019-09-26 NOTE — Progress Notes (Signed)
ILR Remote 

## 2019-09-29 DIAGNOSIS — Z5181 Encounter for therapeutic drug level monitoring: Secondary | ICD-10-CM | POA: Diagnosis not present

## 2019-09-29 DIAGNOSIS — Z794 Long term (current) use of insulin: Secondary | ICD-10-CM | POA: Diagnosis not present

## 2019-09-29 DIAGNOSIS — E039 Hypothyroidism, unspecified: Secondary | ICD-10-CM | POA: Diagnosis not present

## 2019-09-29 DIAGNOSIS — E1142 Type 2 diabetes mellitus with diabetic polyneuropathy: Secondary | ICD-10-CM | POA: Diagnosis not present

## 2019-10-04 ENCOUNTER — Ambulatory Visit: Payer: Medicare HMO | Admitting: Allergy

## 2019-10-05 DIAGNOSIS — M65872 Other synovitis and tenosynovitis, left ankle and foot: Secondary | ICD-10-CM | POA: Diagnosis not present

## 2019-10-05 DIAGNOSIS — Z794 Long term (current) use of insulin: Secondary | ICD-10-CM | POA: Diagnosis not present

## 2019-10-05 DIAGNOSIS — L303 Infective dermatitis: Secondary | ICD-10-CM | POA: Diagnosis not present

## 2019-10-05 DIAGNOSIS — M71571 Other bursitis, not elsewhere classified, right ankle and foot: Secondary | ICD-10-CM | POA: Diagnosis not present

## 2019-10-05 DIAGNOSIS — E1140 Type 2 diabetes mellitus with diabetic neuropathy, unspecified: Secondary | ICD-10-CM | POA: Diagnosis not present

## 2019-10-05 DIAGNOSIS — E1165 Type 2 diabetes mellitus with hyperglycemia: Secondary | ICD-10-CM | POA: Diagnosis not present

## 2019-10-05 DIAGNOSIS — E119 Type 2 diabetes mellitus without complications: Secondary | ICD-10-CM | POA: Diagnosis not present

## 2019-10-08 ENCOUNTER — Emergency Department (HOSPITAL_COMMUNITY): Payer: Medicare HMO

## 2019-10-08 ENCOUNTER — Inpatient Hospital Stay (HOSPITAL_COMMUNITY)
Admission: EM | Admit: 2019-10-08 | Discharge: 2019-10-12 | DRG: 101 | Disposition: A | Discharge status: Home Health | Payer: Medicare HMO | Attending: Neurology | Admitting: Neurology

## 2019-10-08 ENCOUNTER — Inpatient Hospital Stay (HOSPITAL_COMMUNITY): Payer: Medicare HMO

## 2019-10-08 DIAGNOSIS — H53461 Homonymous bilateral field defects, right side: Secondary | ICD-10-CM | POA: Diagnosis present

## 2019-10-08 DIAGNOSIS — I639 Cerebral infarction, unspecified: Secondary | ICD-10-CM | POA: Diagnosis not present

## 2019-10-08 DIAGNOSIS — Z90710 Acquired absence of both cervix and uterus: Secondary | ICD-10-CM

## 2019-10-08 DIAGNOSIS — E039 Hypothyroidism, unspecified: Secondary | ICD-10-CM | POA: Diagnosis present

## 2019-10-08 DIAGNOSIS — E871 Hypo-osmolality and hyponatremia: Secondary | ICD-10-CM | POA: Diagnosis not present

## 2019-10-08 DIAGNOSIS — R1310 Dysphagia, unspecified: Secondary | ICD-10-CM | POA: Diagnosis present

## 2019-10-08 DIAGNOSIS — R29708 NIHSS score 8: Secondary | ICD-10-CM | POA: Diagnosis present

## 2019-10-08 DIAGNOSIS — R404 Transient alteration of awareness: Secondary | ICD-10-CM | POA: Diagnosis not present

## 2019-10-08 DIAGNOSIS — R569 Unspecified convulsions: Secondary | ICD-10-CM | POA: Diagnosis not present

## 2019-10-08 DIAGNOSIS — Z794 Long term (current) use of insulin: Secondary | ICD-10-CM | POA: Diagnosis not present

## 2019-10-08 DIAGNOSIS — I10 Essential (primary) hypertension: Secondary | ICD-10-CM | POA: Diagnosis present

## 2019-10-08 DIAGNOSIS — Z8673 Personal history of transient ischemic attack (TIA), and cerebral infarction without residual deficits: Secondary | ICD-10-CM

## 2019-10-08 DIAGNOSIS — Z87891 Personal history of nicotine dependence: Secondary | ICD-10-CM | POA: Diagnosis not present

## 2019-10-08 DIAGNOSIS — R739 Hyperglycemia, unspecified: Secondary | ICD-10-CM | POA: Diagnosis present

## 2019-10-08 DIAGNOSIS — R479 Unspecified speech disturbances: Secondary | ICD-10-CM | POA: Diagnosis not present

## 2019-10-08 DIAGNOSIS — R509 Fever, unspecified: Secondary | ICD-10-CM | POA: Diagnosis not present

## 2019-10-08 DIAGNOSIS — R4701 Aphasia: Secondary | ICD-10-CM | POA: Diagnosis present

## 2019-10-08 DIAGNOSIS — E1159 Type 2 diabetes mellitus with other circulatory complications: Secondary | ICD-10-CM | POA: Diagnosis present

## 2019-10-08 DIAGNOSIS — E1165 Type 2 diabetes mellitus with hyperglycemia: Secondary | ICD-10-CM | POA: Diagnosis not present

## 2019-10-08 DIAGNOSIS — I129 Hypertensive chronic kidney disease with stage 1 through stage 4 chronic kidney disease, or unspecified chronic kidney disease: Secondary | ICD-10-CM | POA: Diagnosis present

## 2019-10-08 DIAGNOSIS — R4781 Slurred speech: Secondary | ICD-10-CM | POA: Diagnosis not present

## 2019-10-08 DIAGNOSIS — E782 Mixed hyperlipidemia: Secondary | ICD-10-CM | POA: Diagnosis not present

## 2019-10-08 DIAGNOSIS — Z888 Allergy status to other drugs, medicaments and biological substances status: Secondary | ICD-10-CM

## 2019-10-08 DIAGNOSIS — Z23 Encounter for immunization: Secondary | ICD-10-CM

## 2019-10-08 DIAGNOSIS — R0902 Hypoxemia: Secondary | ICD-10-CM | POA: Diagnosis not present

## 2019-10-08 DIAGNOSIS — M810 Age-related osteoporosis without current pathological fracture: Secondary | ICD-10-CM | POA: Diagnosis present

## 2019-10-08 DIAGNOSIS — I6621 Occlusion and stenosis of right posterior cerebral artery: Secondary | ICD-10-CM | POA: Diagnosis not present

## 2019-10-08 DIAGNOSIS — Z87898 Personal history of other specified conditions: Secondary | ICD-10-CM

## 2019-10-08 DIAGNOSIS — R4182 Altered mental status, unspecified: Secondary | ICD-10-CM | POA: Diagnosis not present

## 2019-10-08 DIAGNOSIS — I6389 Other cerebral infarction: Secondary | ICD-10-CM | POA: Diagnosis not present

## 2019-10-08 DIAGNOSIS — K219 Gastro-esophageal reflux disease without esophagitis: Secondary | ICD-10-CM | POA: Diagnosis present

## 2019-10-08 DIAGNOSIS — R42 Dizziness and giddiness: Secondary | ICD-10-CM | POA: Diagnosis not present

## 2019-10-08 DIAGNOSIS — E160 Drug-induced hypoglycemia without coma: Secondary | ICD-10-CM | POA: Diagnosis not present

## 2019-10-08 DIAGNOSIS — R414 Neurologic neglect syndrome: Secondary | ICD-10-CM | POA: Diagnosis not present

## 2019-10-08 DIAGNOSIS — I6603 Occlusion and stenosis of bilateral middle cerebral arteries: Secondary | ICD-10-CM | POA: Diagnosis not present

## 2019-10-08 DIAGNOSIS — N182 Chronic kidney disease, stage 2 (mild): Secondary | ICD-10-CM | POA: Diagnosis present

## 2019-10-08 DIAGNOSIS — Z882 Allergy status to sulfonamides status: Secondary | ICD-10-CM | POA: Diagnosis not present

## 2019-10-08 DIAGNOSIS — E46 Unspecified protein-calorie malnutrition: Secondary | ICD-10-CM | POA: Diagnosis present

## 2019-10-08 DIAGNOSIS — T383X5A Adverse effect of insulin and oral hypoglycemic [antidiabetic] drugs, initial encounter: Secondary | ICD-10-CM | POA: Diagnosis not present

## 2019-10-08 DIAGNOSIS — R29810 Facial weakness: Secondary | ICD-10-CM | POA: Diagnosis present

## 2019-10-08 DIAGNOSIS — Z825 Family history of asthma and other chronic lower respiratory diseases: Secondary | ICD-10-CM

## 2019-10-08 DIAGNOSIS — R401 Stupor: Secondary | ICD-10-CM | POA: Diagnosis not present

## 2019-10-08 DIAGNOSIS — R29818 Other symptoms and signs involving the nervous system: Secondary | ICD-10-CM | POA: Diagnosis not present

## 2019-10-08 DIAGNOSIS — E7849 Other hyperlipidemia: Secondary | ICD-10-CM | POA: Diagnosis not present

## 2019-10-08 DIAGNOSIS — I6523 Occlusion and stenosis of bilateral carotid arteries: Secondary | ICD-10-CM | POA: Diagnosis not present

## 2019-10-08 DIAGNOSIS — T68XXXA Hypothermia, initial encounter: Secondary | ICD-10-CM | POA: Diagnosis not present

## 2019-10-08 DIAGNOSIS — Z79899 Other long term (current) drug therapy: Secondary | ICD-10-CM

## 2019-10-08 DIAGNOSIS — E1122 Type 2 diabetes mellitus with diabetic chronic kidney disease: Secondary | ICD-10-CM | POA: Diagnosis present

## 2019-10-08 DIAGNOSIS — E11649 Type 2 diabetes mellitus with hypoglycemia without coma: Secondary | ICD-10-CM | POA: Diagnosis not present

## 2019-10-08 DIAGNOSIS — Z7983 Long term (current) use of bisphosphonates: Secondary | ICD-10-CM

## 2019-10-08 DIAGNOSIS — Z8249 Family history of ischemic heart disease and other diseases of the circulatory system: Secondary | ICD-10-CM

## 2019-10-08 DIAGNOSIS — E119 Type 2 diabetes mellitus without complications: Secondary | ICD-10-CM | POA: Diagnosis not present

## 2019-10-08 DIAGNOSIS — Z79890 Hormone replacement therapy (postmenopausal): Secondary | ICD-10-CM

## 2019-10-08 DIAGNOSIS — E785 Hyperlipidemia, unspecified: Secondary | ICD-10-CM | POA: Diagnosis not present

## 2019-10-08 DIAGNOSIS — E7800 Pure hypercholesterolemia, unspecified: Secondary | ICD-10-CM | POA: Diagnosis not present

## 2019-10-08 DIAGNOSIS — G9341 Metabolic encephalopathy: Secondary | ICD-10-CM | POA: Diagnosis not present

## 2019-10-08 DIAGNOSIS — G9340 Encephalopathy, unspecified: Secondary | ICD-10-CM | POA: Diagnosis not present

## 2019-10-08 DIAGNOSIS — Z833 Family history of diabetes mellitus: Secondary | ICD-10-CM

## 2019-10-08 DIAGNOSIS — Z7982 Long term (current) use of aspirin: Secondary | ICD-10-CM

## 2019-10-08 DIAGNOSIS — Z6823 Body mass index (BMI) 23.0-23.9, adult: Secondary | ICD-10-CM

## 2019-10-08 LAB — GLUCOSE, CAPILLARY
Glucose-Capillary: 229 mg/dL — ABNORMAL HIGH (ref 70–99)
Glucose-Capillary: 264 mg/dL — ABNORMAL HIGH (ref 70–99)
Glucose-Capillary: 301 mg/dL — ABNORMAL HIGH (ref 70–99)

## 2019-10-08 LAB — APTT: aPTT: 25 seconds (ref 24–36)

## 2019-10-08 LAB — DIFFERENTIAL
Abs Immature Granulocytes: 0.09 10*3/uL — ABNORMAL HIGH (ref 0.00–0.07)
Basophils Absolute: 0 10*3/uL (ref 0.0–0.1)
Basophils Relative: 0 %
Eosinophils Absolute: 0.1 10*3/uL (ref 0.0–0.5)
Eosinophils Relative: 2 %
Immature Granulocytes: 2 %
Lymphocytes Relative: 42 %
Lymphs Abs: 2.2 10*3/uL (ref 0.7–4.0)
Monocytes Absolute: 0.6 10*3/uL (ref 0.1–1.0)
Monocytes Relative: 12 %
Neutro Abs: 2.1 10*3/uL (ref 1.7–7.7)
Neutrophils Relative %: 42 %

## 2019-10-08 LAB — I-STAT CHEM 8, ED
BUN: 22 mg/dL (ref 8–23)
Calcium, Ion: 1.21 mmol/L (ref 1.15–1.40)
Chloride: 93 mmol/L — ABNORMAL LOW (ref 98–111)
Creatinine, Ser: 1.2 mg/dL — ABNORMAL HIGH (ref 0.44–1.00)
Glucose, Bld: 159 mg/dL — ABNORMAL HIGH (ref 70–99)
HCT: 34 % — ABNORMAL LOW (ref 36.0–46.0)
Hemoglobin: 11.6 g/dL — ABNORMAL LOW (ref 12.0–15.0)
Potassium: 4.1 mmol/L (ref 3.5–5.1)
Sodium: 126 mmol/L — ABNORMAL LOW (ref 135–145)
TCO2: 28 mmol/L (ref 22–32)

## 2019-10-08 LAB — COMPREHENSIVE METABOLIC PANEL
ALT: 32 U/L (ref 0–44)
AST: 35 U/L (ref 15–41)
Albumin: 3.6 g/dL (ref 3.5–5.0)
Alkaline Phosphatase: 66 U/L (ref 38–126)
Anion gap: 10 (ref 5–15)
BUN: 18 mg/dL (ref 8–23)
CO2: 24 mmol/L (ref 22–32)
Calcium: 9.9 mg/dL (ref 8.9–10.3)
Chloride: 93 mmol/L — ABNORMAL LOW (ref 98–111)
Creatinine, Ser: 1.26 mg/dL — ABNORMAL HIGH (ref 0.44–1.00)
GFR calc Af Amer: 46 mL/min — ABNORMAL LOW (ref >60)
GFR calc non Af Amer: 39 mL/min — ABNORMAL LOW (ref >60)
Glucose, Bld: 170 mg/dL — ABNORMAL HIGH (ref 70–99)
Potassium: 4.1 mmol/L (ref 3.5–5.1)
Sodium: 127 mmol/L — ABNORMAL LOW (ref 135–145)
Total Bilirubin: 0.7 mg/dL (ref 0.3–1.2)
Total Protein: 6.8 g/dL (ref 6.5–8.1)

## 2019-10-08 LAB — CBC
HCT: 33.8 % — ABNORMAL LOW (ref 36.0–46.0)
Hemoglobin: 11.5 g/dL — ABNORMAL LOW (ref 12.0–15.0)
MCH: 32.1 pg (ref 26.0–34.0)
MCHC: 34 g/dL (ref 30.0–36.0)
MCV: 94.4 fL (ref 80.0–100.0)
Platelets: 256 10*3/uL (ref 150–400)
RBC: 3.58 MIL/uL — ABNORMAL LOW (ref 3.87–5.11)
RDW: 12.7 % (ref 11.5–15.5)
WBC: 5.1 10*3/uL (ref 4.0–10.5)
nRBC: 0 % (ref 0.0–0.2)

## 2019-10-08 LAB — MRSA PCR SCREENING: MRSA by PCR: NEGATIVE

## 2019-10-08 LAB — PROTIME-INR
INR: 1 (ref 0.8–1.2)
Prothrombin Time: 12.9 seconds (ref 11.4–15.2)

## 2019-10-08 LAB — CBG MONITORING, ED: Glucose-Capillary: 162 mg/dL — ABNORMAL HIGH (ref 70–99)

## 2019-10-08 MED ORDER — PANTOPRAZOLE SODIUM 40 MG IV SOLR
40.0000 mg | Freq: Every day | INTRAVENOUS | Status: DC
  Administered 2019-10-08 – 2019-10-09 (×2): 40 mg via INTRAVENOUS
  Filled 2019-10-08 (×2): qty 40

## 2019-10-08 MED ORDER — SODIUM CHLORIDE 0.9 % IV SOLN: 50.0000 mL | Freq: Once | INTRAVENOUS | Status: DC

## 2019-10-08 MED ORDER — INSULIN ASPART 100 UNIT/ML ~~LOC~~ SOLN
0.0000 [IU] | Freq: Every day | SUBCUTANEOUS | Status: DC
  Administered 2019-10-08: 4 [IU] via SUBCUTANEOUS

## 2019-10-08 MED ORDER — ALTEPLASE (STROKE) FULL DOSE INFUSION
0.9000 mg/kg | Freq: Once | INTRAVENOUS | Status: AC
  Administered 2019-10-08: 58.5 mg via INTRAVENOUS
  Filled 2019-10-08: qty 100

## 2019-10-08 MED ORDER — SODIUM CHLORIDE 0.9 % IV SOLN: INTRAVENOUS | Status: DC

## 2019-10-08 MED ORDER — CLEVIDIPINE BUTYRATE 0.5 MG/ML IV EMUL
0.0000 mg/h | INTRAVENOUS | Status: DC
  Administered 2019-10-08 (×2): 2 mg/h via INTRAVENOUS
  Filled 2019-10-08 (×2): qty 50

## 2019-10-08 MED ORDER — LABETALOL HCL 5 MG/ML IV SOLN: 20.0000 mg | Freq: Once | INTRAVENOUS | Status: DC

## 2019-10-08 MED ORDER — ORAL CARE MOUTH RINSE
15.0000 mL | Freq: Two times a day (BID) | OROMUCOSAL | Status: DC
  Administered 2019-10-08 – 2019-10-11 (×8): 15 mL via OROMUCOSAL

## 2019-10-08 MED ORDER — ACETAMINOPHEN 650 MG RE SUPP: 650.0000 mg | RECTAL | Status: DC | PRN

## 2019-10-08 MED ORDER — CHLORHEXIDINE GLUCONATE CLOTH 2 % EX PADS
6.0000 | MEDICATED_PAD | Freq: Every day | CUTANEOUS | Status: DC
  Administered 2019-10-08 – 2019-10-10 (×3): 6 via TOPICAL

## 2019-10-08 MED ORDER — STROKE: EARLY STAGES OF RECOVERY BOOK
Freq: Once | Status: AC
  Filled 2019-10-08: qty 1

## 2019-10-08 MED ORDER — INSULIN ASPART 100 UNIT/ML ~~LOC~~ SOLN: 0.0000 [IU] | Freq: Three times a day (TID) | SUBCUTANEOUS | Status: DC

## 2019-10-08 MED ORDER — ONDANSETRON HCL 4 MG/2ML IJ SOLN: 4.0000 mg | Freq: Four times a day (QID) | INTRAMUSCULAR | Status: DC | PRN

## 2019-10-08 MED ORDER — ONDANSETRON HCL 4 MG/2ML IJ SOLN
INTRAMUSCULAR | Status: AC
  Administered 2019-10-08: 4 mg via INTRAVENOUS
  Filled 2019-10-08: qty 2

## 2019-10-08 MED ORDER — INSULIN ASPART 100 UNIT/ML ~~LOC~~ SOLN
0.0000 [IU] | Freq: Three times a day (TID) | SUBCUTANEOUS | Status: DC
  Administered 2019-10-08: 12:00:00 3 [IU] via SUBCUTANEOUS

## 2019-10-08 MED ORDER — SENNOSIDES-DOCUSATE SODIUM 8.6-50 MG PO TABS: 1.0000 | ORAL_TABLET | Freq: Every evening | ORAL | Status: DC | PRN

## 2019-10-08 MED ORDER — INSULIN ASPART 100 UNIT/ML ~~LOC~~ SOLN
0.0000 [IU] | Freq: Three times a day (TID) | SUBCUTANEOUS | Status: DC
  Administered 2019-10-08: 5 [IU] via SUBCUTANEOUS
  Administered 2019-10-09: 6 [IU] via SUBCUTANEOUS

## 2019-10-08 MED ORDER — SODIUM CHLORIDE 0.9% FLUSH
3.0000 mL | Freq: Once | INTRAVENOUS | Status: AC
Start: 2019-10-08 — End: 2019-10-11
  Administered 2019-10-11: 3 mL via INTRAVENOUS

## 2019-10-08 MED ORDER — IOHEXOL 350 MG/ML SOLN
50.0000 mL | Freq: Once | INTRAVENOUS | Status: AC | PRN
  Administered 2019-10-08: 50 mL via INTRAVENOUS

## 2019-10-08 MED ORDER — ACETAMINOPHEN 325 MG PO TABS
650.0000 mg | ORAL_TABLET | ORAL | Status: DC | PRN
  Administered 2019-10-08 – 2019-10-09 (×2): 650 mg via ORAL
  Filled 2019-10-08 (×2): qty 2

## 2019-10-08 MED ORDER — ACETAMINOPHEN 160 MG/5ML PO SOLN: 650.0000 mg | ORAL | Status: DC | PRN

## 2019-10-08 NOTE — Progress Notes (Signed)
Pharmacist Code Stroke Response  Notified to mix tPA at 0959 by Dr. Leonel Ramsay Delivered tPA to RN at 1003  tPA dose = 5.9mg  bolus over 1 minute followed by 52.6mg  for a total dose of 58.5mg  over 1 hour  Issues/delays encountered (if applicable): Difficulty obtaining IV access  [name] [name], Rande Lawman 10/08/19 10:07 AM

## 2019-10-08 NOTE — H&P (Addendum)
NEURO HOSPITALIST  H&P   Requesting Physician: Dr. Dayna Barker    Chief Complaint:  AMS/ aphasia  History obtained from:  Patient / family HPI:                                                                                                                                         [name] [name] is an 86 y.o. female  With PMH DM2, HTN, HLD, CKD who presented to [facility] ED as a code stroke for AMS, aphasia.  Her daughter reports that she got up and appeared to be acting normally, though she had not spoken with anyone yet this morning.  She made her bed and did her normal activities of daily living.  Her daughter then saw her appear to become lightheaded, and sit down in her room.  She then proceeded to the kitchen where she then sat down and was acting something was not right.  Because of this EMS was activated.  She initially was able to speak relatively well, but started having some difficulty with her speech on route and because of this a code stroke was activated.  On arrival to the emergency department, she was still able to answer questions and I asked her if she felt normal this morning, and she states that she did before having sudden onset of "something just not being right."  When she began speaking to the EMS folks, she was already having some difficulty but continued to worsen on route.  Initially because she had not spoken before symptom onset, EMS did not give a clear last known well, however on discussing with the patient she is very clear that she felt normal prior to about 8:30 AM.  Family also reported increased frequency of bowel movements recently.   ED course:  CTH: no hemorrhage CTA: no LVO  BP: 165/70 BG: 159   Wt 65kg Date last known well: 10/08/19 Time last known well: 0830 tPA Given:yes @1003  Modified Rankin: Rankin Score=1 NIHSS:8     Past Medical History:  Diagnosis Date   Arthritis    "knees, legs" (06/10/2016)    Ascites    Chronic kidney disease    "related to my diabetes"   Chronic lower back pain    Diabetes mellitus without complication (HCC)    Eczema    GERD (gastroesophageal reflux disease)    High cholesterol    Hyperlipidemia    Hypertension    Hypothyroidism    OAB (overactive bladder)    Osteoporosis    Thyroid disease    Type II diabetes mellitus (HCC)    Urticaria     Past  Surgical History:  Procedure Laterality Date   ABDOMINAL HYSTERECTOMY     KNEE ARTHROSCOPY     LAPAROTOMY N/A 12/03/2016   Procedure: EXPLORATORY LAPAROTOMY, LYSIS OF ADHESIONS;  Surgeon: Armandina Gemma, MD;  Location: [facility];  Service: General;  Laterality: N/A;   LOOP RECORDER INSERTION N/A 08/23/2019   Procedure: LOOP RECORDER INSERTION;  Surgeon: Evans Lance, MD;  Location: [place] [facility];  Service: Cardiovascular;  Laterality: N/A;   vocal cord polyps      Family History  Problem Relation Age of Onset   Diabetes Mother    Hypertension Mother    Diabetes Sister    Diabetes Brother    Asthma Father    Allergic rhinitis Neg Hx    Eczema Neg Hx    Urticaria Neg Hx          Social History:  reports that she has never smoked. She quit smokeless tobacco use about 36 years ago.  Her smokeless tobacco use included chew. She reports that she does not drink alcohol or use drugs.  Allergies:  Allergies  Allergen Reactions   Sulfonamide Derivatives Hives   Bactrim [Sulfamethoxazole-Trimethoprim] Rash    Reported by [facility]   Lisinopril Cough    Reported by [facility]    Medications:                                                                                                                           Current Facility-Administered Medications  Medication Dose Route Frequency Provider Last Rate Last Admin   alteplase (ACTIVASE) 1 mg/mL infusion 58.5 mg  0.9 mg/kg Intravenous Once Greta Doom, MD       Followed by   0.9 %  sodium chloride infusion  50 mL Intravenous  Once Greta Doom, MD       sodium chloride flush (NS) 0.9 % injection 3 mL  3 mL Intravenous Once Mesner, Corene Cornea, MD       Current Outpatient Medications  Medication Sig Dispense Refill   acetaminophen (TYLENOL) 500 MG tablet Take 500 mg by mouth every 6 (six) hours as needed for mild pain.      alendronate (FOSAMAX) 70 MG tablet TAKE 1 TABLET BY MOUTH EVERY 7 DAYS ON AN EMPTY STOMACH WITH A FULL GLASS OF WATER (Patient taking differently: Take 70 mg by mouth once a week. Pt takes Wednesday of each week) 12 tablet 0   aspirin EC 81 MG tablet Take 81 mg by mouth daily.     atorvastatin (LIPITOR) 40 MG tablet Take 40 mg by mouth daily at 6 PM.      B-D UF III MINI PEN NEEDLES 31G X 5 MM MISC USE AS DIRECTED TID     calcium-vitamin D 250-100 MG-UNIT tablet Take 1 tablet by mouth daily.      cholecalciferol (VITAMIN D) 1000 units tablet Take 1,000 Units by mouth daily.     ciprofloxacin (CIPRO) 250 MG tablet Take  1 tablet (250 mg total) by mouth 2 (two) times daily. 6 tablet 0   FLUAD 0.5 ML SUSY ADM 0.5ML IM UTD  0   insulin aspart (NOVOLOG) 100 UNIT/ML injection Inject 6-8 Units into the skin 3 (three) times daily before meals. SLIDING SCALE: 70-199= 6 UNITS 200-299= 7 UNITS >300= 8 UNITS     Insulin Detemir (LEVEMIR FLEXTOUCH) 100 UNIT/ML Pen Inject 7 Units into the skin 2 (two) times daily.      Iron-FA-B Cmp-C-Biot-Probiotic (FUSION PLUS) CAPS Take 1 capsule by mouth daily.      levothyroxine (SYNTHROID, LEVOTHROID) 100 MCG tablet Take 100 mcg by mouth daily before breakfast.     losartan (COZAAR) 50 MG tablet Take 50 mg by mouth daily.      metroNIDAZOLE (FLAGYL) 500 MG tablet Take 1 tablet (500 mg total) by mouth every 8 (eight) hours. 9 tablet 0   mometasone (NASONEX) 50 MCG/ACT nasal spray Place 2 sprays into the nose daily as needed (nasal congestion). 17 g 5   Multiple Vitamin (MULTIVITAMIN WITH MINERALS) TABS tablet Take 1 tablet by mouth daily.     Omega-3 Fatty Acids  (FISH OIL) 1000 MG CAPS Take 1,000 mg by mouth 2 (two) times daily.     pantoprazole (PROTONIX) 40 MG tablet Take 40 mg by mouth daily.        ROS:                                                                                                                                       ROS was performed and is negative except as noted in HPI    General Examination:                                                                                                      Weight 65 kg.  Physical Exam  Constitutional: Appears well-developed and well-nourished.  Psych: Affect appropriate to situation Eyes: Normal external eye and conjunctiva. HENT: Normocephalic, no lesions, without obvious abnormality.   Musculoskeletal-no joint tenderness, deformity or swelling Cardiovascular: Normal rate and regular rhythm.  Respiratory: Effort normal, non-labored breathing saturations WNL GI: Soft.  No distension. There is no tenderness.  Skin: WDI  Neurological Examination Mental Status: Alert, unable to state name/age/month/year/, aphasia noted. Able to follow some simple commands. Unable to stick out her tongue or grip Cranial Nerves: II:  Right hemaniopsia III,IV, VI: ptosis not present, extra-ocular motions intact bilaterally, pupils equal, round, reactive to light and accommodation  V,VII: right slight facial droop, facial light touch sensation normal bilaterally VIII: hearing normal bilaterally IX,X: uvula rises midline XI: bilateral shoulder shrug  Motor: Able to lift all 4 extremities anti- gravity without drift. Unable to squeeze hand with right side. Squeezed on left with good strength. Tone and bulk:normal tone throughout; no atrophy noted Sensory: Pinprick and light touch intact throughout, bilaterally, but extinction on right to DSS Deep Tendon Reflexes: 2+ and symmetric biceps and patella Plantars: Right: downgoing   Left: downgoing Cerebellar: No ataxia noted    Lab Results: Basic  Metabolic Panel: Recent Labs  Lab 10/08/19 0942  NA 126*  K 4.1  CL 93*  GLUCOSE 159*  BUN 22  CREATININE 1.20*    CBC: Recent Labs  Lab 10/08/19 0937 10/08/19 0942  WBC 5.1  --   NEUTROABS 2.1  --   HGB 11.5* 11.6*  HCT 33.8* 34.0*  MCV 94.4  --   PLT 256  --    CBG: Recent Labs  Lab 10/08/19 0936  GLUCAP 162*    Imaging: CT HEAD CODE STROKE WO CONTRAST  Result Date: 10/08/2019 CLINICAL DATA:  Code stroke. Focal neuro deficit, greater than 6 hours, stroke suspected. Additional history provided: Aphasia. EXAM: CT HEAD WITHOUT CONTRAST TECHNIQUE: Contiguous axial images were obtained from the base of the skull through the vertex without intravenous contrast. COMPARISON:  MRI/MRA head 09/10/2019, head CT 08/21/2019 FINDINGS: Brain: The examination is mildly motion degraded. There is no evidence of acute intracranial hemorrhage, intracranial mass, midline shift or extra-axial fluid collection.No demarcated cortical infarction. Ill-defined hypoattenuation within the cerebral white matter is nonspecific, but consistent with chronic small vessel ischemic disease. Stable, mild generalized parenchymal atrophy. Vascular: No hyperdense vessel.  Atherosclerotic calcifications. Skull: Normal. Negative for fracture or focal lesion. Sinuses/Orbits: Visualized orbits demonstrate no acute abnormality. Mild ethmoid sinus mucosal thickening. Trace fluid within left mastoid air cells. IMPRESSION: Mildly motion degraded exam. No evidence of acute intracranial abnormality. Generalized parenchymal atrophy and chronic small vessel ischemic disease. Mild ethmoid sinus mucosal thickening. Trace fluid within left mastoid air cells. Electronically Signed   By: Kellie Simmering DO   On: 10/08/2019 10:00       [name] Morale, MSN, NP-C [facility] [phone]  10/08/2019, 9:58 AM   Attending physician note to follow with Assessment and plan .  I have seen the patient reviewed the above  note.  Exam as noted above, initially her aphasia was relatively mild and she was able to answer questions, but this worsened over the course of her evaluation to the point where her responses became less reliable.  When I asked about being normal, I asked her in 2 different ways "where you normal when he woke up?"  To which the answer was yes and "really having any problems when he woke up?"  To which the answer was "no, no problems"  Assessment: 86 y.o. female wth PMH HLD, HTN, DM who presented as a code stroke with sudden onset of aphasia/ AMS. LSN : 0830 patient was a candidate for TPA and it was given. Daughter was consented for TPA. CTH did not show a hemorrhage. CTA did now show an LVO, so she is not a candidate for IR.  Etiology unclear at the current time, but she already has a loop recorder.  Stroke Risk Factors - diabetes mellitus, hyperlipidemia and hypertension    CVA: -- BP goal post tPA is 180/155mmHg,  --MRI Brain  --CTA  --Echocardiogram -- HgbA1c, fasting lipid panel --  PT consult, OT consult, Speech consult --Telemetry monitoring --Frequent neuro checks --Stroke swallow screen   HTN: BP goal 180/105 Labetalol/ cleviprex  To keep BP at goal if needed  HLD: High intensity statin if LDL > 70   DM: SSI  Code status: Full code Disposition: TBD DVT prophylaxis: SCD's only GI: colace, senna  --please page stroke NP  Or  PA  Or MD from 8am -4 pm  as this patient from this time will be  followed by the stroke.    This patient is critically ill and at significant risk of neurological worsening, death and care requires constant monitoring of vital signs, hemodynamics,respiratory and cardiac monitoring, neurological assessment, discussion with family, other specialists and medical decision making of high complexity. I spent 50 minutes of neurocritical care time  in the care of  this patient. This was time spent independent of any time provided by nurse practitioner or  PA.  Roland Rack, MD [facility] [phone]  If 7pm- 7am, please page neurology on call as listed in [place]. 10/08/2019  11:46 AM

## 2019-10-08 NOTE — Progress Notes (Signed)
Patient vomiting, Neuro MD notified and stat head CT ordered. Also noted area of swelling in center of patient's chest near previously implanted loop recorder, MD notified of this as well and thought to be subcutaneous hematoma. Area of swelling marked and will monitor closely.  Candy Sledge, RN

## 2019-10-08 NOTE — Progress Notes (Signed)
Patient failed [facility] swallow screen, SLP consult placed per MD order.  Candy Sledge, RN

## 2019-10-08 NOTE — Evaluation (Signed)
Clinical/Bedside Swallow Evaluation Patient Details  Name: [name] [name] MRN: [id number] Date of Birth: 1934/05/10  Today's Date: 10/08/2019 Time: SLP Start Time (ACUTE ONLY): 1435 SLP Stop Time (ACUTE ONLY): 1455 SLP Time Calculation (min) (ACUTE ONLY): 20 min  Past Medical History:  Past Medical History:  Diagnosis Date  . Arthritis    "knees, legs" (06/10/2016)  . Ascites   . Chronic kidney disease    "related to my diabetes"  . Chronic lower back pain   . Diabetes mellitus without complication ([place])   . Eczema   . GERD (gastroesophageal reflux disease)   . High cholesterol   . Hyperlipidemia   . Hypertension   . Hypothyroidism   . OAB (overactive bladder)   . Osteoporosis   . Thyroid disease   . Type II diabetes mellitus ([place])   . Urticaria    Past Surgical History:  Past Surgical History:  Procedure Laterality Date  . ABDOMINAL HYSTERECTOMY    . KNEE ARTHROSCOPY    . LAPAROTOMY N/A 12/03/2016   Procedure: EXPLORATORY LAPAROTOMY, LYSIS OF ADHESIONS;  Surgeon: Armandina Gemma, MD;  Location: [facility];  Service: General;  Laterality: N/A;  . LOOP RECORDER INSERTION N/A 08/23/2019   Procedure: LOOP RECORDER INSERTION;  Surgeon: Evans Lance, MD;  Location: [facility];  Service: Cardiovascular;  Laterality: N/A;  . vocal cord polyps     HPI:  [name] [name] is an 86 y.o. female  With PMH DM2, HTN, HLD, CKD who presented to [facility] ED as a code stroke for AMS, aphasia.  Her daughter reports that she got up and appeared to be acting normally, though she had not spoken with anyone yet this morning.  She made her bed and did her normal activities of daily living.  Her daughter then saw her appear to become lightheaded, and sit down in her room.  She then proceeded to the kitchen where she then sat down and was acting like something was not right.  Because of this EMS was activated.  She initially was able to speak relatively well, but started having some difficulty with her  speech on route and because of this a code stroke was activated.  On arrival to the emergency department, she was still able to answer questions and I asked her if she felt normal this morning, and she states that she did before having sudden onset of "something just not being right."  When she began speaking to the EMS folks, she was already having some difficulty but continued to worsen on route.  Initially because she had not spoken before symptom onset, EMS did not give a clear last known well, however on discussing with the patient she is very clear that she felt normal prior to about 8:30 AM.  She is s/p tPA administration.  CT of her head was showing no acute intracranial abnormality with stable generalized parenchymal atrophy and chronic small vessell ischemis disease.  Chest xray was showing no active disease.     Assessment / Plan / Recommendation Clinical Impression  Clinical swallowing evaluation was completed using ice chips, thin liquids via spoon, cup and single straw sips, pureed material and dual textured solids.  Limited cranial nerve exam was completed due to her issues following commands.  Facial/labial weakness seen on the right and appeared to impact the upper and lower portions of her face.  Decreased lingual range of motion was seen.  Strength was unable to be formally assessed but anticipate deficits.  She presented with a possible oropharyngeal dysphagia.  Anterior escape was seen across bolus trials. She also appeared to have slow a/p transport of the bolus and oral residue was seen post swallow given dual textured solids.  She appeared to very interimttently have issues initiating a swallow.  Given bolus trials attempted overt s/s of aspiration were not seen.  Serial sips of thin liquids were not attempted.  Recommend initiate a dysphagia 1 diet with thin liquids.  She should take ONE sip at a time.  MBS will be completed next date to assess swallowing physiology and efficiency.    SLP Visit Diagnosis: Dysphagia, unspecified (R13.10)    Aspiration Risk  Moderate aspiration risk    Diet Recommendation   Dysphagia 1 diet with thin liquids.  Medication Administration: Crushed with puree    Other  Recommendations Oral Care Recommendations: Oral care BID   Follow up Recommendations Other (comment)(TBD)             Prognosis Prognosis for Safe Diet Advancement: Good      Swallow Study   General Date of Onset: 10/08/19 HPI: [name] [name] is an 86 y.o. female  With PMH DM2, HTN, HLD, CKD who presented to [facility] ED as a code stroke for AMS, aphasia.  Her daughter reports that she got up and appeared to be acting normally, though she had not spoken with anyone yet this morning.  She made her bed and did her normal activities of daily living.  Her daughter then saw her appear to become lightheaded, and sit down in her room.  She then proceeded to the kitchen where she then sat down and was acting like something was not right.  Because of this EMS was activated.  She initially was able to speak relatively well, but started having some difficulty with her speech on route and because of this a code stroke was activated.  On arrival to the emergency department, she was still able to answer questions and I asked her if she felt normal this morning, and she states that she did before having sudden onset of "something just not being right."  When she began speaking to the EMS folks, she was already having some difficulty but continued to worsen on route.  Initially because she had not spoken before symptom onset, EMS did not give a clear last known well, however on discussing with the patient she is very clear that she felt normal prior to about 8:30 AM.  She is s/p tPA administration.  CT of her head was showing no acute intracranial abnormality with stable generalized parenchymal atrophy and chronic small vessell ischemis disease.  Chest xray was showing no active disease.   Type of  Study: Bedside Swallow Evaluation Previous Swallow Assessment: None noted at [facility] Diet Prior to this Study: NPO Temperature Spikes Noted: No Respiratory Status: Room air History of Recent Intubation: No Behavior/Cognition: Alert;Cooperative;Requires cueing;Doesn't follow directions Oral Cavity Assessment: Within Functional Limits Oral Care Completed by SLP: No Oral Cavity - Dentition: Dentures, bottom;Dentures, top Vision: Impaired for self-feeding Self-Feeding Abilities: Total assist Patient Positioning: Upright in bed Baseline Vocal Quality: Not observed;Low vocal intensity Volitional Cough: Other (Comment)(not tested) Volitional Swallow: Unable to elicit    Oral/Motor/Sensory Function Overall Oral Motor/Sensory Function: Other (comment)(Unable to assess)   Ice Chips Ice chips: Within functional limits Presentation: Spoon   Thin Liquid Thin Liquid: Impaired Presentation: Cup;Spoon;Straw Oral Phase Impairments: Reduced labial seal Oral Phase Functional Implications: Left anterior spillage;Right anterior spillage;Prolonged oral transit  Pharyngeal  Phase Impairments: Suspected delayed Swallow    Nectar Thick Nectar Thick Liquid: Not tested   Honey Thick Honey Thick Liquid: Not tested   Puree Puree: Impaired Presentation: Spoon Oral Phase Impairments: Impaired mastication Oral Phase Functional Implications: Prolonged oral transit Pharyngeal Phase Impairments: Suspected delayed Swallow   Solid     Solid: Impaired Presentation: Spoon Oral Phase Impairments: Poor awareness of bolus;Impaired mastication Oral Phase Functional Implications: Oral residue;Impaired mastication;Prolonged oral transit Pharyngeal Phase Impairments: Suspected delayed [place], MA, CCC-SLP Acute Rehab SLP [phone]  Lamar Sprinkles 10/08/2019,3:19 PM

## 2019-10-08 NOTE — Progress Notes (Signed)
Patient to [id number], unable to assess orientation d/t aphasia. Belongings at bedside: pajama pants, underwear, socks, tank top, long sleeve shirt, and upper/lower dentures.  Candy Sledge, RN

## 2019-10-08 NOTE — Progress Notes (Signed)
Code Stroke Documentation  Code stroke called via EMS with altered mental status and dysphagia. Pt arrived to [facility] ED at [phone]. Initially LKW was unknown, but updated to 0830. Pt cleared for CT. Initial NIHSS 8, see flowsheet documentation. Rapid response RN was assisting with another code stroke and arrived to CT following pt having completed CT. Pt had no IV access and I assisted to obtain IV access without success. Decision made to mix tPA at 0959. tPA delivered to bedside at 1003. IV access was obtained at 1013 and tPA was started with an initial bolus of 5.9 mg, followed by 52.6 mg for a total dose of 58.5 mg. Pt not a candidate for IR. Pt returned to ED room with tPA infusing. Primary RN, [name] [name]., assisted with code stroke and attending to pt. Document VS and mNIHSS per order.

## 2019-10-08 NOTE — Evaluation (Signed)
Speech Language Pathology Evaluation Patient Details Name: [name] [name] MRN: [id number] DOB: 07-Dec-1933 Today's Date: 10/08/2019 Time: [id number] SLP Time Calculation (min) (ACUTE ONLY): 14 min  Problem List:  Patient Active Problem List   Diagnosis Date Noted  . Stroke ([place]) 10/08/2019  . Acute metabolic encephalopathy [id number]  . Right shoulder pain 08/21/2019  . Leukocytosis 08/21/2019  . Acute encephalopathy 08/18/2019  . Enterocolitis 08/18/2019  . Hyperglycemia 08/18/2019  . Chronic kidney disease   . Pressure injury of skin 12/09/2016  . SBO (small bowel obstruction) s/p lysis of adhesions 12/03/2016 12/04/2016  . Acute urinary retention 11/27/2016  . Ascites 11/27/2016  . Abdominal distension   . Facial droop   . Hyperkalemia 11/25/2016  . Sepsis ([place]) 11/25/2016  . Acute kidney injury superimposed on chronic kidney disease ([place]) 11/25/2016  . Delirium 11/25/2016  . Age-related osteoporosis without current pathological fracture 10/01/2016  . Hyperlipidemia 06/10/2016  . GERD (gastroesophageal reflux disease) 06/10/2016  . Insomnia 06/10/2016  . Viral gastroenteritis   . Right hip pain 05/06/2015  . Vaginal atrophy 11/01/2013  . ECZEMA 07/24/2008  . INTERMITTENT VERTIGO 07/24/2008  . Constipation 11/16/2007  . INSOMNIA 08/12/2007  . Essential hypertension 02/14/2007  . ALLERGIC RHINITIS 02/14/2007  . Hypothyroidism, acquired 01/26/2007  . Type 2 diabetes mellitus without complication, with long-term current use of insulin ([place]) 01/26/2007  . HYPERLIPIDEMIA 01/26/2007  . GERD 01/26/2007  . DEGENERATIVE JOINT DISEASE 01/26/2007   Past Medical History:  Past Medical History:  Diagnosis Date  . Arthritis    "knees, legs" (06/10/2016)  . Ascites   . Chronic kidney disease    "related to my diabetes"  . Chronic lower back pain   . Diabetes mellitus without complication ([place])   . Eczema   . GERD (gastroesophageal reflux disease)   . High cholesterol   .  Hyperlipidemia   . Hypertension   . Hypothyroidism   . OAB (overactive bladder)   . Osteoporosis   . Thyroid disease   . Type II diabetes mellitus ([place])   . Urticaria    Past Surgical History:  Past Surgical History:  Procedure Laterality Date  . ABDOMINAL HYSTERECTOMY    . KNEE ARTHROSCOPY    . LAPAROTOMY N/A 12/03/2016   Procedure: EXPLORATORY LAPAROTOMY, LYSIS OF ADHESIONS;  Surgeon: Armandina Gemma, MD;  Location: [facility];  Service: General;  Laterality: N/A;  . LOOP RECORDER INSERTION N/A 08/23/2019   Procedure: LOOP RECORDER INSERTION;  Surgeon: Evans Lance, MD;  Location: [facility];  Service: Cardiovascular;  Laterality: N/A;  . vocal cord polyps     HPI:  [name] [name] is an 86 y.o. female  With PMH DM2, HTN, HLD, CKD who presented to [facility] ED as a code stroke for AMS, aphasia.  Her daughter reports that she got up and appeared to be acting normally, though she had not spoken with anyone yet this morning.  She made her bed and did her normal activities of daily living.  Her daughter then saw her appear to become lightheaded, and sit down in her room.  She then proceeded to the kitchen where she then sat down and was acting like something was not right.  Because of this EMS was activated.  She initially was able to speak relatively well, but started having some difficulty with her speech on route and because of this a code stroke was activated.  On arrival to the emergency department, she was still able to answer questions and I  asked her if she felt normal this morning, and she states that she did before having sudden onset of "something just not being right."  When she began speaking to the EMS folks, she was already having some difficulty but continued to worsen on route.  Initially because she had not spoken before symptom onset, EMS did not give a clear last known well, however on discussing with the patient she is very clear that she felt normal prior to about 8:30 AM.  She is  s/p tPA administration.  CT of her head was showing no acute intracranial abnormality with stable generalized parenchymal atrophy and chronic small vessell ischemis disease.  Chest xray was showing no active disease.     Assessment / Plan / Recommendation Clinical Impression  Informal assessment of language was completed with the patient.  Family at the bedside reported that PTA she lived alone but someone was with her throughout the day.  Per family she was driving PTA.  She presented with a severe expressive and receptive aphasia.  Suspect possible component of oral/verbal apraxia.   Limited cranial nerve exam was completed due to her issues following commands.  Facial/labial weakness seen on the right and appeared to impact the upper and lower portions of her face.  Decreased lingual range of motion was seen.  Strength was unable to be formally assessed but anticipate deficits.   The patient demonstrated limited verbalizations with some vocalizations heard.  She was unable to name objects even given max cues.  She reponsded yes to every yes/no question.  She was unable to identify objects in a field of 2 given max cues.  She struggled to follow simple one step commands consistently even given tactile cues.  She was unable to count 1-5 given max verbal and visual cues.  Given current evaluation she would require 24x7 assistance at DC.  ST will follow up during acute stay and she would benefit from intense post acute ST to address deficits.      SLP Assessment  SLP Recommendation/Assessment: Patient needs continued Speech Lanaguage Pathology Services SLP Visit Diagnosis: Aphasia (R47.01)    Follow Up Recommendations  Inpatient Rehab    Frequency and Duration min 3x week  2 weeks      SLP Evaluation Cognition  Orientation Level: Disoriented X4       Comprehension  Auditory Comprehension Overall Auditory Comprehension: Impaired Yes/No Questions: Impaired Basic Biographical Questions: 26-50%  accurate Basic Immediate Environment Questions: 25-49% accurate Commands: Impaired One Step Basic Commands: 25-49% accurate Reading Comprehension Reading Status: Not tested    Expression Verbal Expression Overall Verbal Expression: Impaired Initiation: Impaired Repetition: Impaired Level of Impairment: Word level Naming: Impairment Confrontation: Impaired Effective Techniques: Articulatory cues Written Expression Written Expression: Not tested   Oral / Motor  Oral Motor/Sensory Function Overall Oral Motor/Sensory Function: Other (comment)(Unable to assess) Motor Speech Overall Motor Speech: Other (comment)(unable to assess)   GO                   Shelly Flatten, MA, CCC-SLP Acute Rehab SLP [phone]  Lamar Sprinkles 10/08/2019, 3:29 PM

## 2019-10-09 ENCOUNTER — Inpatient Hospital Stay (HOSPITAL_COMMUNITY): Payer: Medicare HMO

## 2019-10-09 ENCOUNTER — Encounter: Payer: Medicare HMO | Admitting: Obstetrics & Gynecology

## 2019-10-09 DIAGNOSIS — I10 Essential (primary) hypertension: Secondary | ICD-10-CM

## 2019-10-09 DIAGNOSIS — R509 Fever, unspecified: Secondary | ICD-10-CM

## 2019-10-09 DIAGNOSIS — Z8673 Personal history of transient ischemic attack (TIA), and cerebral infarction without residual deficits: Secondary | ICD-10-CM

## 2019-10-09 DIAGNOSIS — R4182 Altered mental status, unspecified: Secondary | ICD-10-CM

## 2019-10-09 DIAGNOSIS — R479 Unspecified speech disturbances: Secondary | ICD-10-CM

## 2019-10-09 DIAGNOSIS — I639 Cerebral infarction, unspecified: Secondary | ICD-10-CM

## 2019-10-09 DIAGNOSIS — E7800 Pure hypercholesterolemia, unspecified: Secondary | ICD-10-CM

## 2019-10-09 DIAGNOSIS — E1165 Type 2 diabetes mellitus with hyperglycemia: Secondary | ICD-10-CM

## 2019-10-09 DIAGNOSIS — I6389 Other cerebral infarction: Secondary | ICD-10-CM

## 2019-10-09 DIAGNOSIS — R401 Stupor: Secondary | ICD-10-CM

## 2019-10-09 LAB — TSH: TSH: 1.303 u[IU]/mL (ref 0.350–4.500)

## 2019-10-09 LAB — RENAL FUNCTION PANEL
Albumin: 3.5 g/dL (ref 3.5–5.0)
Anion gap: 19 — ABNORMAL HIGH (ref 5–15)
BUN: 19 mg/dL (ref 8–23)
CO2: 17 mmol/L — ABNORMAL LOW (ref 22–32)
Calcium: 9.7 mg/dL (ref 8.9–10.3)
Chloride: 97 mmol/L — ABNORMAL LOW (ref 98–111)
Creatinine, Ser: 1.27 mg/dL — ABNORMAL HIGH (ref 0.44–1.00)
GFR calc Af Amer: 45 mL/min — ABNORMAL LOW (ref >60)
GFR calc non Af Amer: 39 mL/min — ABNORMAL LOW (ref >60)
Glucose, Bld: 179 mg/dL — ABNORMAL HIGH (ref 70–99)
Phosphorus: 3.5 mg/dL (ref 2.5–4.6)
Potassium: 4.6 mmol/L (ref 3.5–5.1)
Sodium: 133 mmol/L — ABNORMAL LOW (ref 135–145)

## 2019-10-09 LAB — CBC
HCT: 31.2 % — ABNORMAL LOW (ref 36.0–46.0)
Hemoglobin: 10.7 g/dL — ABNORMAL LOW (ref 12.0–15.0)
MCH: 32.7 pg (ref 26.0–34.0)
MCHC: 34.3 g/dL (ref 30.0–36.0)
MCV: 95.4 fL (ref 80.0–100.0)
Platelets: 274 10*3/uL (ref 150–400)
RBC: 3.27 MIL/uL — ABNORMAL LOW (ref 3.87–5.11)
RDW: 13.2 % (ref 11.5–15.5)
WBC: 9.7 10*3/uL (ref 4.0–10.5)
nRBC: 0 % (ref 0.0–0.2)

## 2019-10-09 LAB — PHOSPHORUS
Phosphorus: 3.2 mg/dL (ref 2.5–4.6)
Phosphorus: 3.2 mg/dL (ref 2.5–4.6)

## 2019-10-09 LAB — MAGNESIUM
Magnesium: 1.7 mg/dL (ref 1.7–2.4)
Magnesium: 1.7 mg/dL (ref 1.7–2.4)

## 2019-10-09 LAB — LIPID PANEL
Cholesterol: 272 mg/dL — ABNORMAL HIGH (ref 0–200)
HDL: 117 mg/dL (ref >40)
LDL Cholesterol: 142 mg/dL — ABNORMAL HIGH (ref 0–99)
Total CHOL/HDL Ratio: 2.3 RATIO
Triglycerides: 66 mg/dL (ref <150)
VLDL: 13 mg/dL (ref 0–40)

## 2019-10-09 LAB — URINALYSIS, COMPLETE (UACMP) WITH MICROSCOPIC
Bilirubin Urine: NEGATIVE
Glucose, UA: >=500 mg/dL — AB
Hgb urine dipstick: NEGATIVE
Ketones, ur: 5 mg/dL — AB
Leukocytes,Ua: NEGATIVE
Nitrite: NEGATIVE
Protein, ur: 100 mg/dL — AB
Specific Gravity, Urine: 1.016 (ref 1.005–1.030)
pH: 5 (ref 5.0–8.0)

## 2019-10-09 LAB — HEMOGLOBIN A1C
Hgb A1c MFr Bld: 8.7 % — ABNORMAL HIGH (ref 4.8–5.6)
Mean Plasma Glucose: 202.99 mg/dL

## 2019-10-09 LAB — GLUCOSE, CAPILLARY
Glucose-Capillary: 307 mg/dL — ABNORMAL HIGH (ref 70–99)
Glucose-Capillary: 399 mg/dL — ABNORMAL HIGH (ref 70–99)
Glucose-Capillary: 276 mg/dL — ABNORMAL HIGH (ref 70–99)
Glucose-Capillary: 245 mg/dL — ABNORMAL HIGH (ref 70–99)
Glucose-Capillary: 57 mg/dL — ABNORMAL LOW (ref 70–99)

## 2019-10-09 LAB — ECHOCARDIOGRAM COMPLETE
Height: 65 in
Weight: 2215.18 oz

## 2019-10-09 LAB — TRIGLYCERIDES: Triglycerides: 68 mg/dL (ref <150)

## 2019-10-09 LAB — PLATELET INHIBITION P2Y12: Platelet Function  P2Y12: 119 [PRU] — ABNORMAL LOW (ref 182–335)

## 2019-10-09 LAB — T4, FREE: Free T4: 1.48 ng/dL — ABNORMAL HIGH (ref 0.61–1.12)

## 2019-10-09 LAB — AMMONIA: Ammonia: 24 umol/L (ref 9–35)

## 2019-10-09 MED ORDER — INSULIN DETEMIR 100 UNIT/ML ~~LOC~~ SOLN: 6.0000 [IU] | Freq: Two times a day (BID) | SUBCUTANEOUS | Status: DC

## 2019-10-09 MED ORDER — INSULIN DETEMIR 100 UNIT/ML ~~LOC~~ SOLN
4.0000 [IU] | Freq: Every day | SUBCUTANEOUS | Status: DC
  Administered 2019-10-09 – 2019-10-11 (×3): 4 [IU] via SUBCUTANEOUS
  Filled 2019-10-09 (×4): qty 0.04

## 2019-10-09 MED ORDER — VITAL HIGH PROTEIN PO LIQD: 1000.0000 mL | ORAL | Status: DC

## 2019-10-09 MED ORDER — INSULIN ASPART 100 UNIT/ML ~~LOC~~ SOLN
0.0000 [IU] | SUBCUTANEOUS | Status: DC
  Administered 2019-10-09 (×2): 5 [IU] via SUBCUTANEOUS
  Administered 2019-10-10: 3 [IU] via SUBCUTANEOUS

## 2019-10-09 MED ORDER — INSULIN ASPART 100 UNIT/ML ~~LOC~~ SOLN
3.0000 [IU] | Freq: Three times a day (TID) | SUBCUTANEOUS | Status: DC
  Administered 2019-10-09: 3 [IU] via SUBCUTANEOUS

## 2019-10-09 MED ORDER — DEXTROSE 5 % IV SOLN
10.0000 mg/kg | INTRAVENOUS | Status: DC
  Administered 2019-10-09: 630 mg via INTRAVENOUS
  Filled 2019-10-09 (×2): qty 12.6

## 2019-10-09 MED ORDER — LEVOTHYROXINE SODIUM 100 MCG/5ML IV SOLN
70.0000 ug | Freq: Every day | INTRAVENOUS | Status: DC
  Administered 2019-10-09 – 2019-10-10 (×2): 70 ug via INTRAVENOUS
  Filled 2019-10-09 (×2): qty 5

## 2019-10-09 MED ORDER — SODIUM CHLORIDE 0.9 % IV SOLN
1.0000 g | INTRAVENOUS | Status: DC
  Filled 2019-10-09: qty 1000

## 2019-10-09 MED ORDER — LEVOTHYROXINE SODIUM 25 MCG PO TABS
137.0000 ug | ORAL_TABLET | Freq: Every day | ORAL | Status: DC
  Filled 2019-10-09: qty 1

## 2019-10-09 MED ORDER — VANCOMYCIN HCL 750 MG/150ML IV SOLN: 750.0000 mg | INTRAVENOUS | Status: DC

## 2019-10-09 MED ORDER — SODIUM CHLORIDE 0.9 % IV SOLN
2.0000 g | Freq: Two times a day (BID) | INTRAVENOUS | Status: DC
  Administered 2019-10-09 – 2019-10-10 (×3): 2 g via INTRAVENOUS
  Filled 2019-10-09 (×2): qty 2
  Filled 2019-10-09 (×2): qty 20

## 2019-10-09 MED ORDER — VANCOMYCIN HCL 1250 MG/250ML IV SOLN
1250.0000 mg | Freq: Once | INTRAVENOUS | Status: AC
  Administered 2019-10-09: 1250 mg via INTRAVENOUS
  Filled 2019-10-09: qty 250

## 2019-10-09 MED ORDER — SODIUM CHLORIDE 0.9 % IV SOLN
1.0000 g | Freq: Three times a day (TID) | INTRAVENOUS | Status: DC
  Administered 2019-10-09 – 2019-10-10 (×3): 1 g via INTRAVENOUS
  Filled 2019-10-09 (×5): qty 1000

## 2019-10-09 MED ORDER — INSULIN DETEMIR 100 UNIT/ML ~~LOC~~ SOLN
6.0000 [IU] | Freq: Every day | SUBCUTANEOUS | Status: DC
  Administered 2019-10-09 – 2019-10-10 (×2): 6 [IU] via SUBCUTANEOUS
  Filled 2019-10-09 (×3): qty 0.06

## 2019-10-09 MED ORDER — PRO-STAT SUGAR FREE PO LIQD
30.0000 mL | Freq: Two times a day (BID) | ORAL | Status: DC
  Administered 2019-10-09: 30 mL
  Filled 2019-10-09: qty 30

## 2019-10-09 MED ORDER — ATORVASTATIN CALCIUM 40 MG PO TABS
40.0000 mg | ORAL_TABLET | Freq: Every day | ORAL | Status: DC
  Administered 2019-10-09 – 2019-10-11 (×3): 40 mg via ORAL
  Filled 2019-10-09 (×3): qty 1

## 2019-10-09 NOTE — Progress Notes (Signed)
Asked by RN to come apeak to pt's family regarding pt's current inpatient insulin regimen.  According to RN, pt's family had concerns about Novolog orders.  Spoke w/ [name] and [place] (pt's daughters) in room.  Explained my role as diabetes RN in the health system.  Daughters told me pt just saw her ENDO (Dr. Buddy Duty) last Friday (03/19).  Home insulins were adjusted to the following: Novolog 10-12 units Breakfast/ 7-9 units Lunch/ 4-6 units Dinner Levemir 6 units AM/ 4 units QHS  Pt's daughters told me pt scans for her CBGs often at home (has Freestyle Libre CGM that was taken off prior to MRI today).  Daughters reviewed CBGs with me on the day prior to admission.  It appeared that CBGs were in the high 200s to mid 300s range at home during the day prior to admit.  Pt took her insulin as usual and CBGs did finally come down to normal range by the following AM.  Daughters told me pt experiences issues with her bowels and takes Miralax everyday.  Daughters noticed CBGs came down significantly once pt had large bowel movement.  Daughters had questions about her Novolog orders.  I explained that we have Novolog correction scale (SSI) ordered to help bring elevated CBGs down (explained how the scale works and how the RN follows) and also explained that we have pt on small amount of Novolog with meals.  Daughters told me pt not eating much at all.  Explained to daughters that I would contact the MD and ask to stop the food coverage Novolog order and see about increasing the frequency of the Novolog SSI to Q4 hours while pt not eating well and while CBGs are elevated.  Daughters appreciative of info and visit.       --Will follow patient during hospitalization--  Wyn Quaker RN, MSN, CDE Diabetes Coordinator Inpatient Glycemic Control Team Team Pager: [phone] (8a-5p)

## 2019-10-09 NOTE — Evaluation (Addendum)
Physical Therapy Evaluation Patient Details Name: [name] [name] MRN: [id number] DOB: [birth year] Today's Date: 10/09/2019   History of Present Illness  [name] [name] is an 86 y.o. female  With PMH DM2, HTN, HLD, CKD who presented to [facility] ED as a code stroke for AMS, aphasia.  Arrived as code stroke and had TPA administered.  MRI negative for infarct.  Patient admitted for r/o seizure vs. infections process.  Clinical Impression  Patient seen in coordination with OT and SLP and presents with decreased independence with mobility due to weakness, decreased R sided attention, decreased balance sitting, decreased attention and awareness and some motor apraxia.  She previously lived alone and completed ADL's/IADL's independent.  Feel she will benefit from skilled PT in the acute setting and follow up CIR level rehab prior to d/c home with family support. Today limited to activity at the bedside with some orthostatic pressures noted as below.  Time spent explaining to family procedures and recommendations with coordination of therapies for improved pt tolerance.   Orthostatic VS for the past 24 hrs (Last 3 readings):  BP- Lying Pulse- Lying BP- Sitting Pulse- Sitting BP- Standing at 0 minutes Pulse- Standing at 0 minutes  10/09/19 1500 166/63 78 141/67 90 116/84 114      Follow Up Recommendations CIR    Equipment Recommendations  Other (comment)(tba)    Recommendations for Other Services Rehab consult     Precautions / Restrictions Precautions Precautions: Fall Precaution Comments: R inattention      Mobility  Bed Mobility Overal bed mobility: Needs Assistance Bed Mobility: Supine to Sit;Sit to Supine     Supine to sit: Min assist;HOB elevated Sit to supine: Min assist   General bed mobility comments: increased time for initiation, but mobilized well once able to complete automatic task  Transfers Overall transfer level: Needs assistance Equipment used: 2 person hand held  assist Transfers: Sit to/from Stand Sit to Stand: Min assist;+2 safety/equipment         General transfer comment: againk assist to inititate anterior weight shift, then pt able to rise with +2 A for safety and for balance  Ambulation/Gait                Stairs            Wheelchair Mobility    Modified Rankin (Stroke Patients Only) Modified Rankin (Stroke Patients Only) Pre-Morbid Rankin Score: No significant disability Modified Rankin: Severe disability     Balance Overall balance assessment: Needs assistance Sitting-balance support: Feet unsupported Sitting balance-Leahy Scale: Poor Sitting balance - Comments: pt not initially comprehending task and leaning back/scooting back rather than engaging and coming forward, min A for balance while at EOB Postural control: Posterior lean Standing balance support: Bilateral upper extremity supported Standing balance-Leahy Scale: Poor Standing balance comment: stood long enough to complete BP for orthostatic measurement; pt wtih bilateral knees buckling at times and imbalance needing mod A of 2 to maintain standing                             Pertinent Vitals/Pain Pain Assessment: No/denies pain    Home Living Family/patient expects to be discharged to:: Private residence Living Arrangements: Alone Available Help at Discharge: Family Type of Home: House Home Access: Stairs to enter Entrance Stairs-Rails: None Entrance Stairs-Number of Steps: 3 Home Layout: One level Home Equipment: Environmental consultant - 2 wheels;Shower seat      Prior Function Level of  Independence: Independent         Comments: daughters report pt independent w/some memory cues in the home for medications     Hand Dominance   Dominant Hand: Right    Extremity/Trunk Assessment   Upper Extremity Assessment Upper Extremity Assessment: Defer to OT evaluation    Lower Extremity Assessment Lower Extremity Assessment: Generalized  weakness       Communication   Communication: HOH  Cognition Arousal/Alertness: Lethargic Behavior During Therapy: Flat affect Overall Cognitive Status: Impaired/Different from baseline Area of Impairment: Attention;Following commands;Memory;Safety/judgement                   Current Attention Level: Sustained Memory: Decreased short-term memory Following Commands: Follows one step commands inconsistently;Follows one step commands with increased time Safety/Judgement: Decreased awareness of safety;Decreased awareness of deficits            General Comments General comments (skin integrity, edema, etc.): daughters present and supportive and assist with history and engaging patient    Exercises     Assessment/Plan    PT Assessment Patient needs continued PT services  PT Problem List Decreased mobility;Decreased balance;Decreased strength;Decreased coordination;Decreased safety awareness;Decreased cognition       PT Treatment Interventions DME instruction;Stair training;Therapeutic activities;Balance training;Gait training;Functional mobility training;Therapeutic exercise;Patient/family education;Cognitive remediation    PT Goals (Current goals can be found in the Care Plan section)  Acute Rehab PT Goals Patient Stated Goal: per family to return home PT Goal Formulation: With patient/family Time For Goal Achievement: 10/23/19 Potential to Achieve Goals: Good    Frequency Min 4X/week   Barriers to discharge        Co-evaluation PT/OT/SLP Co-Evaluation/Treatment: Yes Reason for Co-Treatment: Complexity of the patient's impairments (multi-system involvement);Necessary to address cognition/behavior during functional activity;To address functional/ADL transfers PT goals addressed during session: Mobility/safety with mobility;Balance   SLP goals addressed during session: Swallowing     AM-PAC PT "6 Clicks" Mobility  Outcome Measure Help needed turning from  your back to your side while in a flat bed without using bedrails?: A Little Help needed moving from lying on your back to sitting on the side of a flat bed without using bedrails?: A Little Help needed moving to and from a bed to a chair (including a wheelchair)?: A Lot Help needed standing up from a chair using your arms (e.g., wheelchair or bedside chair)?: A Lot Help needed to walk in hospital room?: Total Help needed climbing 3-5 steps with a railing? : Total 6 Click Score: 12    End of Session   Activity Tolerance: Other (comment);Patient tolerated treatment well(tolerated, but orthostatic) Patient left: in bed;with call bell/phone within reach;with family/visitor present;with bed alarm set   PT Visit Diagnosis: Other abnormalities of gait and mobility (R26.89);Other symptoms and signs involving the nervous system (R29.898)    Time: [id number] PT Time Calculation (min) (ACUTE ONLY): 24 min   Charges:   PT Evaluation $PT Eval Moderate Complexity: [place], [place] Acute Rehabilitation Services [phone] 10/09/2019   Reginia Naas 10/09/2019, 4:47 PM

## 2019-10-09 NOTE — Progress Notes (Signed)
STROKE TEAM PROGRESS NOTE   INTERVAL HISTORY Daughters at bedside. Pt was getting her EEG done. MRI no infarct. However, pt barely open eyes on voice, not following commands, non verbal, left gaze preference but intermittently gaze to right. PERRL. Right mild facial droop and possible right UE weakness. Given findings, concerning for NCSE vs. CNS infection. Will look for EEG result and consider LP. Pt Temp 100.7 this am.   As per daughter, pt had diarrhea stomachache and hyperglycemia  24 hr prior to admission. She woke up normal yesterday morning, but then had sudden onset lightheadedness, sweating. Sat down felt better but then developed not talking. EMS called, put her flat and she started to talk again, however, during en route with EMS, pt symptoms occurred and getting worse. In ER, pt had right neglect, aphasia, right hemianopia, AMS. Received tPA.    Vitals:   10/09/19 0600 10/09/19 0700 10/09/19 0800 10/09/19 0900  BP: (!) 141/71 (!) 165/77 (!) 166/74 (!) 168/85  Pulse: 91 (!) 104 92 89  Resp: 15 (!) 22 14   Temp:   (!) 100.7 F (38.2 C)   TempSrc:   Oral   SpO2: 99% 99% 99%   Weight:      Height:        CBC:  Recent Labs  Lab 10/08/19 0937 10/08/19 0937 10/08/19 0942 10/09/19 0309  WBC 5.1  --   --  9.7  NEUTROABS 2.1  --   --   --   HGB 11.5*   < > 11.6* 10.7*  HCT 33.8*   < > 34.0* 31.2*  MCV 94.4  --   --  95.4  PLT 256  --   --  274   < > = values in this interval not displayed.    Basic Metabolic Panel:  Recent Labs  Lab 10/08/19 0937 10/08/19 0937 10/08/19 0942 10/09/19 0309  NA 127*   < > 126* 133*  K 4.1   < > 4.1 4.6  CL 93*   < > 93* 97*  CO2 24  --   --  17*  GLUCOSE 170*   < > 159* 179*  BUN 18   < > 22 19  CREATININE 1.26*   < > 1.20* 1.27*  CALCIUM 9.9  --   --  9.7  PHOS  --   --   --  3.5   < > = values in this interval not displayed.   Lipid Panel:     Component Value Date/Time   CHOL 272 (H) 10/09/2019 0309   TRIG 66 10/09/2019 0309    TRIG 68 10/09/2019 0309   HDL 117 10/09/2019 0309   CHOLHDL 2.3 10/09/2019 0309   VLDL 13 10/09/2019 0309   LDLCALC 142 (H) 10/09/2019 0309   HgbA1c:  Lab Results  Component Value Date   HGBA1C 8.7 (H) 10/09/2019   Urine Drug Screen:     Component Value Date/Time   LABOPIA NONE DETECTED 08/21/2019 1030   COCAINSCRNUR NONE DETECTED 08/21/2019 1030   LABBENZ NONE DETECTED 08/21/2019 1030   AMPHETMU NONE DETECTED 08/21/2019 1030   THCU NONE DETECTED 08/21/2019 1030   LABBARB NONE DETECTED 08/21/2019 1030    Alcohol Level     Component Value Date/Time   ETH <10 08/21/2019 0750    IMAGING past 24 hours CT HEAD WO CONTRAST  Result Date: 10/08/2019 CLINICAL DATA:  Post tPA stroke, follow-up. EXAM: CT HEAD WITHOUT CONTRAST TECHNIQUE: Contiguous axial images were obtained from the  base of the skull through the vertex without intravenous contrast. COMPARISON:  Head CT 10/08/2019 FINDINGS: Brain: There is residual circulating contrast material. There is no evidence of acute intracranial hemorrhage, intracranial mass, midline shift or extra-axial fluid collection.No demarcated cortical infarction. Stable generalized parenchymal atrophy and chronic small vessel ischemic disease. Vascular: Circulating contrast material precludes evaluation for hyperdense vessels. Skull: Normal. Negative for fracture or focal lesion. Sinuses/Orbits: Visualized orbits demonstrate no acute abnormality. No significant paranasal sinus disease or mastoid effusion at the imaged levels. IMPRESSION: No evidence of acute intracranial abnormality. Stable generalized parenchymal atrophy and chronic small vessel ischemic disease. Electronically Signed   By: Kellie Simmering DO   On: 10/08/2019 14:32   MR BRAIN WO CONTRAST  Result Date: 10/09/2019 CLINICAL DATA:  Stroke symptoms.  Aphasia.  23 hours post tPA. EXAM: MRI HEAD WITHOUT CONTRAST TECHNIQUE: Multiplanar, multiecho pulse sequences of the brain and surrounding  structures were obtained without intravenous contrast. COMPARISON:  CT head 10/08/2019 FINDINGS: Brain: Negative for acute infarct. Generalized atrophy. Mild chronic white matter changes bilaterally. Brainstem intact. Small chronic infarct left cerebellum. Negative for hemorrhage or mass. Vascular: Normal arterial flow voids. Skull and upper cervical spine: No focal skeletal lesion identified. Sinuses/Orbits: Mild mucosal edema paranasal sinuses. Bilateral cataract extraction. Other: None IMPRESSION: Negative for acute infarct. Atrophy with mild chronic microvascular ischemic change in the white matter. Electronically Signed   By: Franchot Gallo M.D.   On: 10/09/2019 10:20   DG Chest Portable 1 View  Result Date: 10/08/2019 CLINICAL DATA:  Acute mental status change EXAM: PORTABLE CHEST 1 VIEW COMPARISON:  August 17, 2019 FINDINGS: The heart size and mediastinal contours are within normal limits. Both lungs are clear. The visualized skeletal structures are unremarkable. IMPRESSION: No active disease. Electronically Signed   By: Dorise Bullion III M.D   On: 10/08/2019 10:42    PHYSICAL EXAM  Temp:  [99 F (37.2 C)-100.7 F (38.2 C)] 99.3 F (37.4 C) (03/22 1200) Pulse Rate:  [77-133] 82 (03/22 1400) Resp:  [11-25] 12 (03/22 1400) BP: (127-183)/(47-106) 164/66 (03/22 1400) SpO2:  [97 %-100 %] 99 % (03/22 1400)  General - Well nourished, well developed, in no apparent distress, barely open eyes and not following commands.  Ophthalmologic - fundi not visualized due to noncooperation.  Cardiovascular - Regular rhythm and rate.  Neuro - lethargic, barely open eyes on voice. Once eyes open, left gaze preference, intermittent right gaze. PERRL. Not blinking to visual threat on the right. Mild right facial droop. RUE proximal weakness 3-/5 and LUE 3+/5 proximally. BLE withdraw to pain at least 3-/5. DTR 1+ and no babinski. Sensation, coordination and gait not tested.   ASSESSMENT/PLAN [name] is a 86 y.o. female with history of HTN, DB, HLD, CKD presenting with altered mental status and global aphasia.  tPA administered 10/08/2019 at 1013.  AMS - concerning for CNS infection vs. NCSE vs. Prolonged post ictal. Paraneoplastic encephalitis less likely   Code Stroke CT head No acute abnormality. Small vessel disease. Atrophy. Sinus dz.     CTA head no LVO. L M2/MCA branch w/ mild to moderate origin stenosis. R M2/MCA moderate origin stenosis. R P2/PCA multifocal moderate stenosis. B ICA atherosclerosis w/ R ICA cavernous and paraclinoid sites mild to moderate stenoses. L ICA mild narrowing.  CTA neck B ICA mild plaque. R VA mild ostial stenosis.  Repeat CT head no acute abnormality. Small vessel disease. Atrophy.   MRI  No acute infarct. Small vessel  disease. Atrophy.   2D Echo EF 65-70%  EEG Pending  LE doppler pending   Loop interrogation requested  LDL 142  HgbA1c 8.7  TSH, FT4, thyroid antibodies pending  SCDs for VTE prophylaxis  aspirin 81 mg daily and clopidogrel 75 mg daily prior to admission,  on No antithrombotic within 24h of tPA administration. Hold off DAPT 24h after tPA in case LP needed.  Therapy recommendations:  Pending. Ok to be OOB  Disposition:  pending   Fever with AMS  Tmax 100.3->100.7  WBC 9.7  Concerning for CNS infection  Not able to do LP due to plavix only be off for 2 days  Blood culture pending  UA pending  CXR - neg  Ammonia level pending  Will start empiric antibiotics for CNS infection coverage  May consider MRI with contrast if no improvement  EEG pending  Hx TIA vs. AMS episode  08/2019 - TIA vs. Seizure presenting with R arm weakness, with diaphoretic episode and passed out for 2 min. DAPT x 3 weeks. MRI and MRA neg. CUS neg. EF 60-65%. EEG neg. LDL 133 and A1C 8.6. Loop placed.  Hypertension  Home meds:  Losartan 50  Stable BP goal per post tPA protocol x 24h following tPA  administration . Long-term BP goal normotensive  Hyperlipidemia  Home meds:  lipitor 40 and omega 3, resumed in hospital  LDL 142, goal < 70  Continue statin at discharge  Diabetes type II Uncontrolled  Home meds:  levemir 6uam, 4u - resumed in hospital  HgbA1c 8.7, goal < 7.0  CBGs  SSI  hyperglycemia  DB RN following  Novolog 3u tid w/ meals and levemir 6u/4u bid  Dysphagia   NPO now due to AMS  Speech on board  cortrak placement  Resume home PO meds once po access  Other Stroke Risk Factors  Advanced age  Hx smokeless tobacco use, quit 36 yrs ago   Other Active Problems  Hypothyroidism on synthroid  GERD on PPI  CKD 1.26->1.27  Hyponatremia 127->133  Chronic normocytic anemia 11.5->10.7  Hospital day # 1  This patient is critically ill due to AMS, fever, focal neuro deficit, dysphagia and at significant risk of neurological worsening, death form status epilepticus, sepsis, meningitis, herpes encephalitis. This patient's care requires constant monitoring of vital signs, hemodynamics, respiratory and cardiac monitoring, review of multiple databases, neurological assessment, discussion with family, other specialists and medical decision making of high complexity. I spent 50 minutes of neurocritical care time in the care of this patient. I had long discussion with daughters at bedside, updated pt current condition, treatment plan and potential prognosis, and answered all the questions. She expressed understanding and appreciation.   Rosalin Hawking, MD PhD Stroke Neurology 10/09/2019 2:46 PM   To contact Stroke Continuity provider, please refer to [email or web address] After hours, contact General Neurology

## 2019-10-09 NOTE — Progress Notes (Signed)
  Echocardiogram 2D Echocardiogram has been performed.  [name] [name] 10/09/2019, 8:44 AM

## 2019-10-09 NOTE — Progress Notes (Signed)
  Speech Language Pathology Treatment: Dysphagia  Patient Details Name: [name] [name] MRN: [id number] DOB: 1934-06-08 Today's Date: 10/09/2019 Time: [id number] SLP Time Calculation (min) (ACUTE ONLY): 16 min  Assessment / Plan / Recommendation Clinical Impression  Pt seen for dysphagia treatment with PT/OT for stimulation and arousal. Pt offered thin liquids and purees. Pt was noted with swift swallow and good oral clearance. No s/sx of aspiration observed for either consistency, despite challenging with consecutive sips of thin liquids and consuming ~3oz puree. Pt noted to occasionally miss mouth with spoon when self-feeding, but had good awareness and lingual control to remove puree from her face/lips. Education was provided to pt's family (daughters) regarding a possible MBS vs waiting to see if pt becomes consistently more alert and increases intake. The pt's family agreed to hold off on MBS, and let SLP check in throughout the week to determine clinical readiness/need for MBS. Will continue to follow.    HPI HPI: [name] [name] is an 86 y.o. female  With PMH DM2, HTN, HLD, CKD who presented to [facility] ED as a code stroke for AMS, aphasia. CT of her head was showing no acute intracranial abnormality with stable generalized parenchymal atrophy and chronic small vessell ischemis disease. Chest xray was showing no active disease. MRI negative for acute infarct.       SLP Plan  Continue with current plan of care       Recommendations  Diet recommendations: Dysphagia 1 (puree);Thin liquid Liquids provided via: Straw;Cup Medication Administration: Crushed with puree Supervision: Full supervision/cueing for compensatory strategies Compensations: Minimize environmental distractions;Slow rate;Small sips/bites Postural Changes and/or Swallow Maneuvers: Seated upright 90 degrees                Oral Care Recommendations: Oral care BID Follow up Recommendations: Skilled Nursing  facility;Inpatient Rehab SLP Visit Diagnosis: Dysphagia, oropharyngeal phase (R13.12) Plan: Continue with current plan of care       [place], Student SLP Office: [phone]  10/09/2019, 3:54 PM

## 2019-10-09 NOTE — Progress Notes (Signed)
EEG complete - results pending 

## 2019-10-09 NOTE — Progress Notes (Signed)
Pt having imaging performed, not in room for EEG will come back.

## 2019-10-09 NOTE — Progress Notes (Signed)
Pharmacy Antibiotic Note  [name] [name] is a 86 y.o. female admitted on 10/08/2019 with possible meningitis. LP cannot be preformed at this time due to recent aspirin and plavix doses. Pharmacy has been consulted for Acyclovir, Ampicillin, and Vancomycin dosing.  Utilized traditional vanco dosing due the the need for higher troughs in this patient.  Patient's creatinine clearance is 30.2 ml/min.  Plan: Vancomycin 1250 mg IV x 1, then 750 mg IV q24hr Acyclovir 630 mg IV q24hr Ampicillin 1 gram IV q8hr Monitor renal function, C&S, clinical status and vanc levels as needed.  Height: 5\' 5"  (165.1 cm) Weight: 138 lb 7.2 oz (62.8 kg) IBW/kg (Calculated) : 57  Temp (24hrs), Avg:99.7 F (37.6 C), Min:99 F (37.2 C), Max:100.7 F (38.2 C)  Recent Labs  Lab 10/08/19 0937 10/08/19 0942 10/09/19 0309  WBC 5.1  --  9.7  CREATININE 1.26* 1.20* 1.27*    Estimated Creatinine Clearance: 30.2 mL/min (A) (by C-G formula based on SCr of 1.27 mg/dL (H)).    Allergies  Allergen Reactions  . Sulfonamide Derivatives Hives  . Bactrim [Sulfamethoxazole-Trimethoprim] Rash    Reported by [facility]  . Lisinopril Cough    Reported by [facility]    Antimicrobials this admission: Rocephin 3/22 >>  Vanc 3/22 >>  Ampicillin 3/22>> Acyclovir 3/22>>  Thank you for allowing pharmacy to be a part of this patient's care.  Alanda Slim, PharmD, [facility] Clinical Pharmacist Please see AMION for all Pharmacists' Contact Phone Numbers 10/09/2019, 2:47 PM

## 2019-10-09 NOTE — Progress Notes (Signed)
Rehab Admissions Coordinator Note:  Per PT, OT, and SLP recommendation, this patient was screened by Raechel Ache for appropriateness for an Inpatient Acute Rehab Consult.  At this time, we are recommending an Inpatient Rehab consult. AC will contact MD to request order.   Raechel Ache 10/09/2019, 5:47 PM  I can be reached at [phone].

## 2019-10-09 NOTE — Progress Notes (Signed)
Discussed with pharmacy, pt not LP candidate due to plavix only off for 2 days. Will start empiric Ab treatment once Blood culture drawn. EEG no seizure. MRI no infarct.   Pt seems somehow more responsive with voice, able to open eyes easily but still not following commands or verbal. Still has left gaze preference. Right facial droop improved. Right UE possible weaker than left but not cooperative on exam. Discussed with daughter again, working diagnosis CNS infection vs. Prolonged post ictal. Will put on Abx. Repeat EEG or LTM EEG in am if no improvement. May also consider MRI with contrast in am if no improvement. Will put in cortrak for nutrition and meds.   Rosalin Hawking, MD PhD Stroke Neurology 10/09/2019 2:52 PM

## 2019-10-09 NOTE — Progress Notes (Signed)
OT Cancellation Note  Patient Details Name: [name] [name] MRN: [id number] DOB: 07-19-1934   Cancelled Treatment:    Reason Eval/Treat Not Completed: Active bedrest order(Will return as schedule allows. )  [place] MSOT, OTR/L Acute Rehab Pager: [phone] Office: [phone] 10/09/2019, 7:13 AM

## 2019-10-09 NOTE — Evaluation (Addendum)
Occupational Therapy Evaluation Patient Details Name: [name] [name] MRN: [id number] DOB: 01/24/1934 Today's Date: 10/09/2019    History of Present Illness 86 y.o. female who presented to [facility] ED as a code stroke for AMS, aphasia.  Arrived as code stroke and had TPA administered.  MRI negative for infarct.  Patient admitted for r/o seizure vs. infections process. PMH including DM2, HTN, HLD, and CKD.   Clinical Impression   PTA, pt was living alone and was independent per daughter's report. [name]'s present throughout session. Pt currently requiring Max-Total A for ADLs and Min A +2 for bed mobility and sit<>stand. Pt presenting with decreased functional use of RUE, balance, vision, cognition, following of cues, and balance. Pt requiring Max cues throughout session for engagement in self feeding task while sitting at EOB. Pt will require further acute OT to increase occupational performance and participation to facilitate safe dc. Recommend dc to CIR for intensive therapy to optimize safety and independence with ADLs as well as decrease caregiver burden.   Orthostatic BPs   Supine 166/63  Sitting at EOB 141/67  Standing 116/84  Supine 136/64       Follow Up Recommendations  CIR;Supervision/Assistance - 24 hour    Equipment Recommendations  Other (comment)(Defer to next venue)    Recommendations for Other Services PT consult;Rehab consult;Speech consult     Precautions / Restrictions Precautions Precautions: Fall Precaution Comments: R inattention Restrictions Weight Bearing Restrictions: No      Mobility Bed Mobility Overal bed mobility: Needs Assistance Bed Mobility: Supine to Sit;Sit to Supine     Supine to sit: Min assist;HOB elevated;+2 for physical assistance Sit to supine: Min assist;+2 for physical assistance   General bed mobility comments: increased time for initiation, but mobilized well once able to complete automatic task  Transfers Overall  transfer level: Needs assistance Equipment used: 2 person hand held assist Transfers: Sit to/from Stand Sit to Stand: Min assist;+2 safety/equipment         General transfer comment: assist to inititate anterior weight shift, then pt able to rise with +2 A for safety and for balance    Balance Overall balance assessment: Needs assistance Sitting-balance support: Feet unsupported Sitting balance-Leahy Scale: Poor Sitting balance - Comments: pt not initially comprehending task and leaning back/scooting back rather than engaging and coming forward, min A for balance while at EOB Postural control: Posterior lean Standing balance support: Bilateral upper extremity supported Standing balance-Leahy Scale: Poor Standing balance comment: stood long enough to complete BP for orthostatic measurement; pt wtih bilateral knees buckling at times and imbalance needing mod A of 2 to maintain standing                           ADL either performed or assessed with clinical judgement   ADL Overall ADL's : Needs assistance/impaired Eating/Feeding: Maximal assistance;Sitting Eating/Feeding Details (indicate cue type and reason): Pt engaging in self feeding task to eat pudding. Poor bialteral coorindation and requiring Max A to scoop pudding and then bring to mouth (spoon in dominant right hand and pudding cup in left hand). Placing spoon in left hand, pt then able to scoop pudding with Mod hand over hand and then guide left hadn to mouth.  Grooming: Maximal assistance;Sitting;Wash/dry face Grooming Details (indicate cue type and reason): Max hand over hand to wipe mouth. Poor following of cues Upper Body Bathing: Maximal assistance;Sitting   Lower Body Bathing: Total assistance;Sit to/from stand   Upper  Body Dressing : Maximal assistance;Sitting   Lower Body Dressing: Total assistance;Sit to/from stand   Toilet Transfer: Moderate assistance;+2 for physical assistance(sit<>stand at  EOB) Toilet Transfer Details (indicate cue type and reason): Mod A +2 for maintain standing at EOB         Functional mobility during ADLs: Minimal assistance;+2 for physical assistance;Moderate assistance(sit<>stand at EOB; Mod A +2 in standing) General ADL Comments: Max-Total A for ADLs due to decreased cognition, following of commands, attention to right, balance, and activity tolerance.      Vision Baseline Vision/History: Wears glasses Wears Glasses: At all times(Daughter unsure of reason for glasses) Patient Visual Report: No change from baseline Vision Assessment?: Yes;Vision impaired- to be further tested in functional context Alignment/Gaze Preference: Gaze right;Head turned Additional Comments: Pt with tendency for head turn and eye gaze to right. Able to track to left with cues. Will continue to assess     Perception     Praxis      Pertinent Vitals/Pain Pain Assessment: Faces Faces Pain Scale: No hurt Pain Intervention(s): Monitored during session     Hand Dominance Right   Extremity/Trunk Assessment Upper Extremity Assessment Upper Extremity Assessment: RUE deficits/detail;LUE deficits/detail RUE Deficits / Details: Limited active movement. Pt also pulling away with PROM of RUE. RUE Coordination: decreased fine motor;decreased gross motor LUE Deficits / Details: Overshooting to right when performing self feeding tasks. Poor FM skills with decreased grasp and in-hand manipulation. Able to bring spoon to mouth with poor coordination.  LUE Coordination: decreased fine motor;decreased gross motor   Lower Extremity Assessment Lower Extremity Assessment: Defer to PT evaluation       Communication Communication Communication: HOH   Cognition Arousal/Alertness: Lethargic Behavior During Therapy: Flat affect Overall Cognitive Status: Impaired/Different from baseline Area of Impairment: Attention;Following commands;Memory;Safety/judgement                    Current Attention Level: Sustained Memory: Decreased short-term memory Following Commands: Follows one step commands inconsistently;Follows one step commands with increased time Safety/Judgement: Decreased awareness of safety;Decreased awareness of deficits     General Comments: Pt stating yes/no to 50% of questions; unsure of consistently and reliability. Pt not following simple verbal cues. Engaging pt in self feeding task. Required Max tactile and verbal cues for sequencing of self feeding task. Inattention to right with little movement at RUE and head turn to left   General Comments  Daughter present throughout session. BP supine 166/63, sitting 141/67, standing 116/84, and supine 136/64    Exercises     Shoulder Instructions      Home Living Family/patient expects to be discharged to:: Private residence Living Arrangements: Alone Available Help at Discharge: Family Type of Home: House Home Access: Stairs to enter Technical brewer of Steps: 3 Entrance Stairs-Rails: None Home Layout: One level     Bathroom Shower/Tub: Teacher, early years/pre: Standard     Home Equipment: Environmental consultant - 2 wheels;Shower seat   Additional Comments: [name] reporting that her sister was staying with pt recently as she works from home      Prior Functioning/Environment Level of Independence: Independent        Comments: Daughter reports that pt was independent with ADLs and IADLs including driving. Also reporting that they assisted with medication management. Pt enjoys working in her yard.        OT Problem List: Decreased strength;Decreased range of motion;Decreased activity tolerance;Impaired balance (sitting and/or standing);Decreased cognition;Decreased safety awareness;Decreased knowledge of use  of DME or AE;Decreased knowledge of precautions;Impaired UE functional use;Decreased coordination;Impaired vision/perception      OT Treatment/Interventions: Self-care/ADL  training;Therapeutic exercise;Energy conservation;DME and/or AE instruction;Therapeutic activities;Patient/family education    OT Goals(Current goals can be found in the care plan section) Acute Rehab OT Goals Patient Stated Goal: per family to return home OT Goal Formulation: With patient/family Time For Goal Achievement: 10/23/19 Potential to Achieve Goals: Good  OT Frequency: Min 2X/week   Barriers to D/C:            Co-evaluation PT/OT/SLP Co-Evaluation/Treatment: Yes Reason for Co-Treatment: For patient/therapist safety;To address functional/ADL transfers PT goals addressed during session: Mobility/safety with mobility;Balance OT goals addressed during session: ADL's and self-care SLP goals addressed during session: Swallowing    AM-PAC OT "6 Clicks" Daily Activity     Outcome Measure Help from another person eating meals?: A Lot Help from another person taking care of personal grooming?: A Lot Help from another person toileting, which includes using toliet, bedpan, or urinal?: Total Help from another person bathing (including washing, rinsing, drying)?: Total Help from another person to put on and taking off regular upper body clothing?: A Lot Help from another person to put on and taking off regular lower body clothing?: Total 6 Click Score: 9   End of Session Nurse Communication: Mobility status  Activity Tolerance: Patient tolerated treatment well Patient left: in bed;with call bell/phone within reach;with bed alarm set;with nursing/sitter in room;with family/visitor present  OT Visit Diagnosis: Unsteadiness on feet (R26.81);Other abnormalities of gait and mobility (R26.89);Muscle weakness (generalized) (M62.81);Other symptoms and signs involving cognitive function                Time: 1451-1537 OT Time Calculation (min): 46 min Charges:  OT General Charges $OT Visit: 1 Visit OT Evaluation $OT Eval Moderate Complexity: 1 Mod OT Treatments $Self Care/Home  Management : 8-22 mins  Gaby Harney MSOT, OTR/L Acute Rehab Pager: [phone] Office: [place] 10/09/2019, 5:26 PM

## 2019-10-09 NOTE — Progress Notes (Addendum)
Inpatient Diabetes Program Recommendations  AACE/ADA: New Consensus Statement on Inpatient Glycemic Control (2015)  Target Ranges:  Prepandial:   less than 140 mg/dL      Peak postprandial:   less than 180 mg/dL (1-2 hours)      Critically ill patients:  140 - 180 mg/dL   Results for CEDRIA, STILLING (MRN [id number]) as of 10/09/2019 07:37  Ref. Range 10/08/2019 09:36 10/08/2019 11:54 10/08/2019 17:00 10/08/2019 21:11  Glucose-Capillary Latest Ref Range: 70 - 99 mg/dL 162 (H) 229 (H)  3 units NOVOLOG  264 (H)  5 units NOVOLOG  301 (H)  4 units NOVOLOG    Results for [name] [name], [name] [name] (MRN [id number]) as of 10/09/2019 07:37  Ref. Range 08/18/2019 08:28 10/09/2019 03:09  Hemoglobin A1C Latest Ref Range: 4.8 - 5.6 % 8.6 (H) 8.7 (H)  (202 mg/dl)    Admit with: AMS/ Aphasia/ CVA  History: DM, CKD  Home DM Meds: Novolog 10-12 units Breakfast/ 7-9 units Lunch/ 4-6 units Dinner       Levemir 6 units AM/ 4 units QHS  Current Orders: Novolog Moderate Correction Scale/ SSI (0-15 units) TID AC + HS    Endocrinologist: Dr. Delrae Rend with [facility]--last seen 04/2019   A1c relatively unchanged since January  Needs follow up with her Endo after discharge to help achieve more desirable A1c level    MD- Note patient takes Levemir insulin at home.  1. May consider restarting her home doses of Levemir insulin:  Levemir 6 units AM/ 4 units QHS (please start this AM)   2. May also consider starting a portion of her home dose of meal time insulin:  Novolog 3 units TID with meals   (Please add the following Hold Parameters: Hold if pt eats <50% of meal, Hold if pt NPO)      --Will follow patient during hospitalization--  Wyn Quaker RN, MSN, CDE Diabetes Coordinator Inpatient Glycemic Control Team Team Pager: [phone] (8a-5p)

## 2019-10-09 NOTE — ED Provider Notes (Signed)
[facility]TH NEURO/TRAUMA/SURGICAL ICU Provider Note   CSN: [id number] Arrival date & time: 10/08/19  [id number]  An emergency department physician performed an initial assessment on this suspected stroke patient at 0933.  History No chief complaint on file.   [name] [name] is a 86 y.o. female.  The history is limited by the condition of the patient.  Cerebrovascular Accident This is a new problem. The current episode started 1 to 2 hours ago. The problem occurs constantly. The problem has been gradually worsening. Pertinent negatives include no chest pain. Nothing aggravates the symptoms. Nothing relieves the symptoms. She has tried nothing for the symptoms.       Past Medical History:  Diagnosis Date  . Arthritis    "knees, legs" (06/10/2016)  . Ascites   . Chronic kidney disease    "related to my diabetes"  . Chronic lower back pain   . Diabetes mellitus without complication ([place])   . Eczema   . GERD (gastroesophageal reflux disease)   . High cholesterol   . Hyperlipidemia   . Hypertension   . Hypothyroidism   . OAB (overactive bladder)   . Osteoporosis   . Thyroid disease   . Type II diabetes mellitus ([place])   . Urticaria     Patient Active Problem List   Diagnosis Date Noted  . Stroke ([place]) 10/08/2019  . Acute metabolic encephalopathy [id number]  . Right shoulder pain 08/21/2019  . Leukocytosis 08/21/2019  . Acute encephalopathy 08/18/2019  . Enterocolitis 08/18/2019  . Hyperglycemia 08/18/2019  . Chronic kidney disease   . Pressure injury of skin 12/09/2016  . SBO (small bowel obstruction) s/p lysis of adhesions 12/03/2016 12/04/2016  . Acute urinary retention 11/27/2016  . Ascites 11/27/2016  . Abdominal distension   . Facial droop   . Hyperkalemia 11/25/2016  . Sepsis ([place]) 11/25/2016  . Acute kidney injury superimposed on chronic kidney disease ([place]) 11/25/2016  . Delirium 11/25/2016  . Age-related osteoporosis without current pathological fracture  10/01/2016  . Hyperlipidemia 06/10/2016  . GERD (gastroesophageal reflux disease) 06/10/2016  . Insomnia 06/10/2016  . Viral gastroenteritis   . Right hip pain 05/06/2015  . Vaginal atrophy 11/01/2013  . ECZEMA 07/24/2008  . INTERMITTENT VERTIGO 07/24/2008  . Constipation 11/16/2007  . INSOMNIA 08/12/2007  . Essential hypertension 02/14/2007  . ALLERGIC RHINITIS 02/14/2007  . Hypothyroidism, acquired 01/26/2007  . Type 2 diabetes mellitus without complication, with long-term current use of insulin ([place]) 01/26/2007  . HYPERLIPIDEMIA 01/26/2007  . GERD 01/26/2007  . DEGENERATIVE JOINT DISEASE 01/26/2007    Past Surgical History:  Procedure Laterality Date  . ABDOMINAL HYSTERECTOMY    . KNEE ARTHROSCOPY    . LAPAROTOMY N/A 12/03/2016   Procedure: EXPLORATORY LAPAROTOMY, LYSIS OF ADHESIONS;  Surgeon: Armandina Gemma, MD;  Location: [facility];  Service: General;  Laterality: N/A;  . LOOP RECORDER INSERTION N/A 08/23/2019   Procedure: LOOP RECORDER INSERTION;  Surgeon: Evans Lance, MD;  Location: [place] [facility];  Service: Cardiovascular;  Laterality: N/A;  . vocal cord polyps       OB History    Gravida  5   Para  5   Term  0   Preterm  0   AB  0   Living  4     SAB  0   TAB  0   Ectopic  0   Multiple      Live Births           Obstetric Comments  1 son passed away         Family History  Problem Relation Age of Onset  . Diabetes Mother   . Hypertension Mother   . Diabetes Sister   . Diabetes Brother   . Asthma Father   . Allergic rhinitis Neg Hx   . Eczema Neg Hx   . Urticaria Neg Hx     Social History   Tobacco Use  . Smoking status: Never Smoker  . Smokeless tobacco: Former Systems developer    Types: Chew  Substance Use Topics  . Alcohol use: No  . Drug use: No    Home Medications Prior to Admission medications   Medication Sig Start Date End Date Taking? Authorizing Provider  acetaminophen (TYLENOL) 500 MG tablet Take 500-1,000 mg by mouth at  bedtime as needed for mild pain.    Yes [provider]  aspirin EC 81 MG tablet Take 81 mg by mouth daily.   Yes [provider]  atorvastatin (LIPITOR) 40 MG tablet Take 40 mg by mouth daily at 6 PM.  02/16/19  Yes [provider]  calcium-vitamin D 250-100 MG-UNIT tablet Take 1 tablet by mouth daily.    Yes [provider]  cetirizine (ZYRTEC) 10 MG tablet Take 10 mg by mouth daily as needed for allergies.   Yes [provider]  cholecalciferol (VITAMIN D) 1000 units tablet Take 1,000 Units by mouth daily.   Yes [provider]  clopidogrel (PLAVIX) 75 MG tablet Take 75 mg by mouth daily.   Yes [provider]  FLUAD 0.5 ML SUSY ADM 0.5ML IM UTD 03/09/18  Yes [provider]  insulin aspart (NOVOLOG) 100 UNIT/ML injection Inject 4-12 Units into the skin See admin instructions. Inject 10-12 units at breakfast, 7-9 units at lunch and 4-6 units at dinner.   Yes [provider]  Insulin Detemir (LEVEMIR FLEXTOUCH) 100 UNIT/ML Pen Inject 4-6 Units into the skin See admin instructions. Inject 6 units in the morning and 4 units at bedtime.   Yes [provider]  Iron-FA-B Cmp-C-Biot-Probiotic (FUSION PLUS) CAPS Take 1 capsule by mouth daily.  03/02/19  Yes [provider]  losartan (COZAAR) 50 MG tablet Take 50 mg by mouth daily.  01/24/19  Yes [provider]  Multiple Vitamin (MULTIVITAMIN WITH MINERALS) TABS tablet Take 1 tablet by mouth daily.   Yes [provider]  nystatin (MYCOSTATIN) 100000 UNIT/ML suspension Take 4 mLs by mouth at bedtime as needed.   Yes [provider]  Omega-3 Fatty Acids (FISH OIL) 1000 MG CAPS Take 1,000 mg by mouth 2 (two) times daily.   Yes [provider]  pantoprazole (PROTONIX) 40 MG tablet Take 40 mg by mouth daily.  03/25/19  Yes [provider]  polyethylene glycol (MIRALAX / GLYCOLAX) 17 g packet Take 17 g by mouth daily as needed  for mild constipation.   Yes [provider]  SYNTHROID 137 MCG tablet Take 137 mcg by mouth every morning. 10/04/19  Yes [provider]  B-D UF III MINI PEN NEEDLES 31G X 5 MM MISC USE AS DIRECTED TID 02/14/19   [provider]    Allergies    Sulfonamide derivatives, Bactrim [sulfamethoxazole-trimethoprim], and Lisinopril  Review of Systems   Review of Systems  Unable to perform ROS: Acuity of condition  Cardiovascular: Negative for chest pain.    Physical Exam Updated Vital Signs BP (!) 165/77   Pulse (!) 104   Temp (!) 100.7 F (  38.2 C) (Oral)   Resp (!) 22   Ht 5\' 5"  (1.651 m)   Wt 62.8 kg   SpO2 99%   BMI 23.04 kg/m   Physical Exam Vitals and nursing note reviewed.  HENT:     Head: Normocephalic and atraumatic.     Nose: Nose normal.  Eyes:     General: No scleral icterus.    Conjunctiva/sclera: Conjunctivae normal.  Cardiovascular:     Rate and Rhythm: Tachycardia present.  Pulmonary:     Effort: Pulmonary effort is normal.  Abdominal:     General: There is no distension.  Musculoskeletal:        General: Normal range of motion.     Cervical back: Normal range of motion.  Skin:    General: Skin is warm and dry.  Neurological:     Mental Status: She is alert.     Motor: Weakness (right sided, right hemianopsia) present.     ED Results / Procedures / Treatments   Labs (all labs ordered are listed, but only abnormal results are displayed) Labs Reviewed  CBC - Abnormal; Notable for the following components:      Result Value   RBC 3.58 (*)    Hemoglobin 11.5 (*)    HCT 33.8 (*)    All other components within normal limits  DIFFERENTIAL - Abnormal; Notable for the following components:   Abs Immature Granulocytes 0.09 (*)    All other components within normal limits  COMPREHENSIVE METABOLIC PANEL - Abnormal; Notable for the following components:   Sodium 127 (*)    Chloride 93 (*)    Glucose, Bld 170 (*)    Creatinine,  Ser 1.26 (*)    GFR calc non Af Amer 39 (*)    GFR calc Af Amer 46 (*)    All other components within normal limits  GLUCOSE, CAPILLARY - Abnormal; Notable for the following components:   Glucose-Capillary 229 (*)    All other components within normal limits  HEMOGLOBIN A1C - Abnormal; Notable for the following components:   Hgb A1c MFr Bld 8.7 (*)    All other components within normal limits  LIPID PANEL - Abnormal; Notable for the following components:   Cholesterol 272 (*)    LDL Cholesterol 142 (*)    All other components within normal limits  GLUCOSE, CAPILLARY - Abnormal; Notable for the following components:   Glucose-Capillary 264 (*)    All other components within normal limits  GLUCOSE, CAPILLARY - Abnormal; Notable for the following components:   Glucose-Capillary 301 (*)    All other components within normal limits  GLUCOSE, CAPILLARY - Abnormal; Notable for the following components:   Glucose-Capillary 307 (*)    All other components within normal limits  I-STAT CHEM 8, ED - Abnormal; Notable for the following components:   Sodium 126 (*)    Chloride 93 (*)    Creatinine, Ser 1.20 (*)    Glucose, Bld 159 (*)    Hemoglobin 11.6 (*)    HCT 34.0 (*)    All other components within normal limits  CBG MONITORING, ED - Abnormal; Notable for the following components:   Glucose-Capillary 162 (*)    All other components within normal limits  MRSA PCR SCREENING  PROTIME-INR  APTT  TRIGLYCERIDES  URINALYSIS, ROUTINE W REFLEX MICROSCOPIC  CBC  RENAL FUNCTION PANEL    EKG None  Radiology CT Code Stroke CTA Head W/WO contrast  Result Date: 10/08/2019 CLINICAL DATA:  Aphasia.  EXAM: CT ANGIOGRAPHY HEAD AND NECK TECHNIQUE: Multidetector CT imaging of the head and neck was performed using the standard protocol during bolus administration of intravenous contrast. Multiplanar CT image reconstructions and MIPs were obtained to evaluate the vascular anatomy. Carotid stenosis  measurements (when applicable) are obtained utilizing NASCET criteria, using the distal internal carotid diameter as the denominator. CONTRAST:  Administered contrast not known at this time COMPARISON:  Noncontrast head CT 10/08/2019, MRI/MRA head 08/21/2019. FINDINGS: CTA NECK FINDINGS Aortic arch: Standard aortic branching. Calcified atherosclerotic plaque within the visualized aortic arch and proximal major branch vessels of the neck. No significant innominate or proximal subclavian artery stenosis. Right carotid system: See seen ICA patent within the neck without stenosis. Minimal calcified plaque within the carotid bifurcation. Left carotid system: CCA and ICA patent within the neck without stenosis. Mild calcified plaque within the carotid bifurcation and proximal ICA. Vertebral arteries: The vertebral arteries are codominant and patent within the neck bilaterally. Calcified plaque at the origin the right vertebral artery with resultant mild ostial stenosis. Skeleton: Cervical spondylosis. Most notably there is advanced disc height loss with prominent degenerative endplate irregularity and degenerative endplate sclerosis at D34-534 and T1-T2. Other neck: No neck mass or cervical lymphadenopathy. Upper chest: No consolidation within the imaged lung apices. Review of the MIP images confirms the above findings CTA HEAD FINDINGS Anterior circulation: The intracranial internal carotid arteries are patent bilaterally with scattered calcified plaque. Sites of mild to moderate stenosis within the cavernous and paraclinoid right ICA. No more than mild narrowing of the left ICA. The M1 middle cerebral arteries are patent without significant stenosis. Mild-to-moderate stenosis at the origin of a right M2 MCA branch (series 7, image. 99). Moderate stenosis at the origin of a right M2 MCA branch vessel (series 7, image 101). The anterior cerebral arteries are patent without high-grade proximal stenosis. No intracranial  aneurysm is identified. Posterior circulation: The intracranial vertebral arteries are patent without significant stenosis, as is the basilar artery. The posterior cerebral arteries are patent bilaterally. Multifocal sites of moderate stenosis within the P2 right posterior cerebral artery. Posterior communicating arteries are poorly delineated and may be hypoplastic or absent bilaterally. Venous sinuses: Within limitations of contrast timing, no convincing thrombus. Anatomic variants: As described Review of the MIP images confirms the above findings No emergent large vessel occlusion identified. These results were called by telephone at the time of interpretation on 10/08/2019 at 10:30 am to provider [facility] , who verbally acknowledged these results. IMPRESSION: CTA neck: 1. The bilateral common and internal carotid arteries are patent within the neck without significant stenosis. Mild plaque in the bilateral carotid systems as described. 2. The vertebral arteries are patent within the neck bilaterally. Calcified plaque results in mild ostial stenosis on the right. CTA head: 1. No intracranial large vessel occlusion. 2. Mild-to-moderate stenosis at the origin of a left M2 MCA branch vessel. 3. Moderate stenosis at the origin of a right M2 MCA branch vessel. 4. Multifocal moderate stenoses within the P2 right posterior cerebral artery. 5. Atherosclerotic calcifications within the intracranial internal carotid arteries. Sites of mild to moderate stenosis within the cavernous and paraclinoid right ICA. No more than mild narrowing of the left ICA. Electronically Signed   By: Kellie Simmering DO   On: 10/08/2019 10:41   CT HEAD WO CONTRAST  Result Date: 10/08/2019 CLINICAL DATA:  Post tPA stroke, follow-up. EXAM: CT HEAD WITHOUT CONTRAST TECHNIQUE: Contiguous axial images were obtained from the base of the skull  through the vertex without intravenous contrast. COMPARISON:  Head CT 10/08/2019 FINDINGS: Brain:  There is residual circulating contrast material. There is no evidence of acute intracranial hemorrhage, intracranial mass, midline shift or extra-axial fluid collection.No demarcated cortical infarction. Stable generalized parenchymal atrophy and chronic small vessel ischemic disease. Vascular: Circulating contrast material precludes evaluation for hyperdense vessels. Skull: Normal. Negative for fracture or focal lesion. Sinuses/Orbits: Visualized orbits demonstrate no acute abnormality. No significant paranasal sinus disease or mastoid effusion at the imaged levels. IMPRESSION: No evidence of acute intracranial abnormality. Stable generalized parenchymal atrophy and chronic small vessel ischemic disease. Electronically Signed   By: Kellie Simmering DO   On: 10/08/2019 14:32   CT Code Stroke CTA Neck W/WO contrast  Result Date: 10/08/2019 CLINICAL DATA:  Aphasia. EXAM: CT ANGIOGRAPHY HEAD AND NECK TECHNIQUE: Multidetector CT imaging of the head and neck was performed using the standard protocol during bolus administration of intravenous contrast. Multiplanar CT image reconstructions and MIPs were obtained to evaluate the vascular anatomy. Carotid stenosis measurements (when applicable) are obtained utilizing NASCET criteria, using the distal internal carotid diameter as the denominator. CONTRAST:  Administered contrast not known at this time COMPARISON:  Noncontrast head CT 10/08/2019, MRI/MRA head 08/21/2019. FINDINGS: CTA NECK FINDINGS Aortic arch: Standard aortic branching. Calcified atherosclerotic plaque within the visualized aortic arch and proximal major branch vessels of the neck. No significant innominate or proximal subclavian artery stenosis. Right carotid system: See seen ICA patent within the neck without stenosis. Minimal calcified plaque within the carotid bifurcation. Left carotid system: CCA and ICA patent within the neck without stenosis. Mild calcified plaque within the carotid bifurcation and  proximal ICA. Vertebral arteries: The vertebral arteries are codominant and patent within the neck bilaterally. Calcified plaque at the origin the right vertebral artery with resultant mild ostial stenosis. Skeleton: Cervical spondylosis. Most notably there is advanced disc height loss with prominent degenerative endplate irregularity and degenerative endplate sclerosis at D34-534 and T1-T2. Other neck: No neck mass or cervical lymphadenopathy. Upper chest: No consolidation within the imaged lung apices. Review of the MIP images confirms the above findings CTA HEAD FINDINGS Anterior circulation: The intracranial internal carotid arteries are patent bilaterally with scattered calcified plaque. Sites of mild to moderate stenosis within the cavernous and paraclinoid right ICA. No more than mild narrowing of the left ICA. The M1 middle cerebral arteries are patent without significant stenosis. Mild-to-moderate stenosis at the origin of a right M2 MCA branch (series 7, image. 99). Moderate stenosis at the origin of a right M2 MCA branch vessel (series 7, image 101). The anterior cerebral arteries are patent without high-grade proximal stenosis. No intracranial aneurysm is identified. Posterior circulation: The intracranial vertebral arteries are patent without significant stenosis, as is the basilar artery. The posterior cerebral arteries are patent bilaterally. Multifocal sites of moderate stenosis within the P2 right posterior cerebral artery. Posterior communicating arteries are poorly delineated and may be hypoplastic or absent bilaterally. Venous sinuses: Within limitations of contrast timing, no convincing thrombus. Anatomic variants: As described Review of the MIP images confirms the above findings No emergent large vessel occlusion identified. These results were called by telephone at the time of interpretation on 10/08/2019 at 10:30 am to provider [facility] , who verbally acknowledged these results.  IMPRESSION: CTA neck: 1. The bilateral common and internal carotid arteries are patent within the neck without significant stenosis. Mild plaque in the bilateral carotid systems as described. 2. The vertebral arteries are patent within the neck bilaterally. Calcified  plaque results in mild ostial stenosis on the right. CTA head: 1. No intracranial large vessel occlusion. 2. Mild-to-moderate stenosis at the origin of a left M2 MCA branch vessel. 3. Moderate stenosis at the origin of a right M2 MCA branch vessel. 4. Multifocal moderate stenoses within the P2 right posterior cerebral artery. 5. Atherosclerotic calcifications within the intracranial internal carotid arteries. Sites of mild to moderate stenosis within the cavernous and paraclinoid right ICA. No more than mild narrowing of the left ICA. Electronically Signed   By: Kellie Simmering DO   On: 10/08/2019 10:41   DG Chest Portable 1 View  Result Date: 10/08/2019 CLINICAL DATA:  Acute mental status change EXAM: PORTABLE CHEST 1 VIEW COMPARISON:  August 17, 2019 FINDINGS: The heart size and mediastinal contours are within normal limits. Both lungs are clear. The visualized skeletal structures are unremarkable. IMPRESSION: No active disease. Electronically Signed   By: Dorise Bullion III M.D   On: 10/08/2019 10:42   CT HEAD CODE STROKE WO CONTRAST  Result Date: 10/08/2019 CLINICAL DATA:  Code stroke. Focal neuro deficit, greater than 6 hours, stroke suspected. Additional history provided: Aphasia. EXAM: CT HEAD WITHOUT CONTRAST TECHNIQUE: Contiguous axial images were obtained from the base of the skull through the vertex without intravenous contrast. COMPARISON:  MRI/MRA head 09/10/2019, head CT 08/21/2019 FINDINGS: Brain: The examination is mildly motion degraded. There is no evidence of acute intracranial hemorrhage, intracranial mass, midline shift or extra-axial fluid collection.No demarcated cortical infarction. Ill-defined hypoattenuation within the  cerebral white matter is nonspecific, but consistent with chronic small vessel ischemic disease. Stable, mild generalized parenchymal atrophy. Vascular: No hyperdense vessel.  Atherosclerotic calcifications. Skull: Normal. Negative for fracture or focal lesion. Sinuses/Orbits: Visualized orbits demonstrate no acute abnormality. Mild ethmoid sinus mucosal thickening. Trace fluid within left mastoid air cells. IMPRESSION: Mildly motion degraded exam. No evidence of acute intracranial abnormality. Generalized parenchymal atrophy and chronic small vessel ischemic disease. Mild ethmoid sinus mucosal thickening. Trace fluid within left mastoid air cells. Electronically Signed   By: Kellie Simmering DO   On: 10/08/2019 10:00    Procedures .Critical Care Performed by: Merrily Pew, MD Authorized by: Merrily Pew, MD   Critical care provider statement:    Critical care time (minutes):  45   Critical care was necessary to treat or prevent imminent or life-threatening deterioration of the following conditions:  CNS failure or compromise   Critical care was time spent personally by me on the following activities:  Discussions with consultants, evaluation of patient's response to treatment, examination of patient, ordering and performing treatments and interventions, ordering and review of laboratory studies, ordering and review of radiographic studies, pulse oximetry, re-evaluation of patient's condition, obtaining history from patient or surrogate and review of old charts Angiocath insertion  Date/Time: 10/09/2019 8:48 AM Performed by: Merrily Pew, MD Authorized by: Merrily Pew, MD  Consent: The procedure was performed in an emergent situation. Site marked: the operative site was marked Required items: required blood products, implants, devices, and special equipment available Patient identity confirmed: arm band Time out: Immediately prior to procedure a "time out" was called to verify the correct  patient, procedure, equipment, support staff and site/side marked as required. Preparation: Patient was prepped and draped in the usual sterile fashion. Local anesthesia used: no  Anesthesia: Local anesthesia used: no  Sedation: Patient sedated: no  Patient tolerance: patient tolerated the procedure well with no immediate complications Comments: Angiocath insertion Performed by: Merrily Pew  Consent: Verbal consent obtained.  Risks and benefits: risks, benefits and alternatives were discussed Time out: Immediately prior to procedure a "time out" was called to verify the correct patient, procedure, equipment, support staff and site/side marked as required.  Preparation: Patient was prepped and draped in the usual sterile fashion.  Vein Location: left basilic  Ultrasound Guided  Gauge: 20  Normal blood return and flush without difficulty Patient tolerance: Patient tolerated the procedure well with no immediate complications.      (including critical care time)  Medications Ordered in ED Medications  sodium chloride flush (NS) 0.9 % injection 3 mL (has no administration in time range)  alteplase (ACTIVASE) 1 mg/mL infusion 58.5 mg (0 mg/kg  65 kg Intravenous Stopped 10/08/19 1113)    Followed by  0.9 %  sodium chloride infusion (has no administration in time range)  0.9 %  sodium chloride infusion ( Intravenous Rate/Dose Verify 10/09/19 0700)  acetaminophen (TYLENOL) tablet 650 mg (650 mg Oral Given 10/08/19 1800)    Or  acetaminophen (TYLENOL) 160 MG/5ML solution 650 mg ( Per Tube See Alternative 10/08/19 1800)    Or  acetaminophen (TYLENOL) suppository 650 mg ( Rectal See Alternative 10/08/19 1800)  senna-docusate (Senokot-S) tablet 1 tablet (has no administration in time range)  pantoprazole (PROTONIX) injection 40 mg (40 mg Intravenous Given 10/08/19 2115)  labetalol (NORMODYNE) injection 20 mg (20 mg Intravenous Not Given 10/08/19 1540)    And  clevidipine (CLEVIPREX)  infusion 0.5 mg/mL (0 mg/hr Intravenous Stopped 10/09/19 0105)  Chlorhexidine Gluconate Cloth 2 % PADS 6 each (6 each Topical Given 10/08/19 1152)  MEDLINE mouth rinse (15 mLs Mouth Rinse Given 10/08/19 2115)  ondansetron (ZOFRAN) injection 4 mg (4 mg Intravenous Given 10/08/19 1436)  insulin aspart (novoLOG) injection 0-5 Units (4 Units Subcutaneous Given 10/08/19 2113)  insulin aspart (novoLOG) injection 0-15 Units (5 Units Subcutaneous Given 10/08/19 1755)  iohexol (OMNIPAQUE) 350 MG/ML injection 50 mL (50 mLs Intravenous Contrast Given 10/08/19 1024)   stroke: mapping our early stages of recovery book ( Does not apply Given 10/08/19 1437)    ED Course  I have reviewed the triage vital signs and the nursing notes.  Pertinent labs & imaging results that were available during my care of the patient were reviewed by me and considered in my medical decision making (see chart for details).    MDM Rules/Calculators/A&P                      Unable to obtain full history secondary to acuity of situation and receiving tpa, rapid admission to the neuro ICU.   Final Clinical Impression(s) / ED Diagnoses Final diagnoses:  Acute CVA (cerebrovascular accident) [facility])    Rx / [place] Orders ED Discharge Orders    None       Chigozie Basaldua, Corene Cornea, MD 10/09/19 [phone]

## 2019-10-09 NOTE — Progress Notes (Signed)
SLP Cancellation Note  Patient Details Name: [name] [name] MRN: [id number] DOB: 01/12/1934   Cancelled treatment:       Reason Eval/Treat Not Completed: Medical issues which prohibited therapy. Attempted to complete MBS today. Initially the study was postponed due to EEG being performed. Then, study was deferred per nursing until next date at the earliest. Will continue to follow as indicated.    Osie Bond., M.A. [facility] Pager [phone] Office [phone]  10/09/2019, 1:38 PM

## 2019-10-09 NOTE — Progress Notes (Signed)
Lower extremity venous has been completed.   Preliminary results in CV Proc.   [name] [name] 10/09/2019 2:38 PM

## 2019-10-09 NOTE — Procedures (Signed)
Patient Name: [name] [name]  MRN: [id number]  Epilepsy Attending: Lora Havens  Referring Physician/Provider: Dr. Rosalin Hawking Date: 10/09/2019 Duration: 22.22 minutes  Patient history: 86 year old female who presented as a code of stroke for altered mental status and aphasia.  MRI brain did not show acute stroke.  EEG evaluate for seizures.  Level of alertness: Lethargic  AEDs during EEG study: None  Technical aspects: This EEG study was done with scalp electrodes positioned according to the 10-20 International system of electrode placement. Electrical activity was acquired at a sampling rate of 500Hz  and reviewed with a high frequency filter of 70Hz  and a low frequency filter of 1Hz . EEG data were recorded continuously and digitally stored.   Description: No clear posterior rhythm was seen.  EEG showed condyle generalized low amplitude 2 to 3 Hz delta slowing.  Intermittent triphasic waves, generalized, maximal bifrontal were also noted.  Hyperventilation and photic stimulation were not performed.  Abnormality -Continuous slow, generalized -Triphasic waves, generalized   IMPRESSION: This study is suggestive of moderate to severe diffuse encephalopathy, nonspecific etiology could be secondary toxic-metabolic causes.  No seizures or epileptiform discharges were seen throughout the recording.  [name]

## 2019-10-10 ENCOUNTER — Other Ambulatory Visit (HOSPITAL_COMMUNITY): Payer: Medicare HMO

## 2019-10-10 ENCOUNTER — Other Ambulatory Visit (HOSPITAL_COMMUNITY): Payer: Self-pay

## 2019-10-10 ENCOUNTER — Inpatient Hospital Stay (HOSPITAL_COMMUNITY): Payer: Medicare HMO

## 2019-10-10 DIAGNOSIS — E46 Unspecified protein-calorie malnutrition: Secondary | ICD-10-CM | POA: Diagnosis present

## 2019-10-10 DIAGNOSIS — G9340 Encephalopathy, unspecified: Secondary | ICD-10-CM

## 2019-10-10 DIAGNOSIS — E782 Mixed hyperlipidemia: Secondary | ICD-10-CM

## 2019-10-10 DIAGNOSIS — E871 Hypo-osmolality and hyponatremia: Secondary | ICD-10-CM

## 2019-10-10 LAB — CBC
HCT: 27.8 % — ABNORMAL LOW (ref 36.0–46.0)
Hemoglobin: 9.6 g/dL — ABNORMAL LOW (ref 12.0–15.0)
MCH: 31.9 pg (ref 26.0–34.0)
MCHC: 34.5 g/dL (ref 30.0–36.0)
MCV: 92.4 fL (ref 80.0–100.0)
Platelets: 256 10*3/uL (ref 150–400)
RBC: 3.01 MIL/uL — ABNORMAL LOW (ref 3.87–5.11)
RDW: 13.2 % (ref 11.5–15.5)
WBC: 8.6 10*3/uL (ref 4.0–10.5)
nRBC: 0 % (ref 0.0–0.2)

## 2019-10-10 LAB — PHOSPHORUS: Phosphorus: 2.7 mg/dL (ref 2.5–4.6)

## 2019-10-10 LAB — GLUCOSE, CAPILLARY
Glucose-Capillary: 138 mg/dL — ABNORMAL HIGH (ref 70–99)
Glucose-Capillary: 168 mg/dL — ABNORMAL HIGH (ref 70–99)
Glucose-Capillary: 224 mg/dL — ABNORMAL HIGH (ref 70–99)
Glucose-Capillary: 229 mg/dL — ABNORMAL HIGH (ref 70–99)
Glucose-Capillary: 279 mg/dL — ABNORMAL HIGH (ref 70–99)
Glucose-Capillary: 327 mg/dL — ABNORMAL HIGH (ref 70–99)
Glucose-Capillary: 333 mg/dL — ABNORMAL HIGH (ref 70–99)
Glucose-Capillary: 58 mg/dL — ABNORMAL LOW (ref 70–99)
Glucose-Capillary: 72 mg/dL (ref 70–99)

## 2019-10-10 LAB — THYROID PEROXIDASE ANTIBODY: Thyroperoxidase Ab SerPl-aCnc: <9 IU/mL (ref 0–34)

## 2019-10-10 LAB — THYROGLOBULIN ANTIBODY: Thyroglobulin Antibody: 1.2 IU/mL — ABNORMAL HIGH (ref 0.0–0.9)

## 2019-10-10 LAB — BASIC METABOLIC PANEL
Anion gap: 10 (ref 5–15)
BUN: 17 mg/dL (ref 8–23)
CO2: 24 mmol/L (ref 22–32)
Calcium: 8.9 mg/dL (ref 8.9–10.3)
Chloride: 95 mmol/L — ABNORMAL LOW (ref 98–111)
Creatinine, Ser: 1.2 mg/dL — ABNORMAL HIGH (ref 0.44–1.00)
GFR calc Af Amer: 48 mL/min — ABNORMAL LOW (ref >60)
GFR calc non Af Amer: 42 mL/min — ABNORMAL LOW (ref >60)
Glucose, Bld: 189 mg/dL — ABNORMAL HIGH (ref 70–99)
Potassium: 4.1 mmol/L (ref 3.5–5.1)
Sodium: 129 mmol/L — ABNORMAL LOW (ref 135–145)

## 2019-10-10 LAB — MAGNESIUM: Magnesium: 1.8 mg/dL (ref 1.7–2.4)

## 2019-10-10 MED ORDER — FUSION PLUS PO CAPS: 1.0000 | ORAL_CAPSULE | Freq: Every day | ORAL | Status: DC

## 2019-10-10 MED ORDER — LEVOTHYROXINE SODIUM 25 MCG PO TABS
137.0000 ug | ORAL_TABLET | Freq: Every day | ORAL | Status: DC
  Administered 2019-10-11 – 2019-10-12 (×2): 137 ug via ORAL
  Filled 2019-10-10 (×2): qty 1

## 2019-10-10 MED ORDER — PANTOPRAZOLE SODIUM 40 MG PO TBEC
40.0000 mg | DELAYED_RELEASE_TABLET | Freq: Every day | ORAL | Status: DC
  Administered 2019-10-10 – 2019-10-12 (×3): 40 mg via ORAL
  Filled 2019-10-10 (×3): qty 1

## 2019-10-10 MED ORDER — LOSARTAN POTASSIUM 50 MG PO TABS
50.0000 mg | ORAL_TABLET | Freq: Every day | ORAL | Status: DC
  Administered 2019-10-10 – 2019-10-12 (×3): 50 mg via ORAL
  Filled 2019-10-10 (×3): qty 1

## 2019-10-10 MED ORDER — INSULIN ASPART 100 UNIT/ML ~~LOC~~ SOLN
0.0000 [IU] | Freq: Three times a day (TID) | SUBCUTANEOUS | Status: DC
  Administered 2019-10-10: 11 [IU] via SUBCUTANEOUS
  Administered 2019-10-10 (×2): 5 [IU] via SUBCUTANEOUS
  Administered 2019-10-11: 8 [IU] via SUBCUTANEOUS

## 2019-10-10 MED ORDER — LEVETIRACETAM 500 MG PO TABS
500.0000 mg | ORAL_TABLET | Freq: Two times a day (BID) | ORAL | Status: DC
  Administered 2019-10-10 – 2019-10-12 (×5): 500 mg via ORAL
  Filled 2019-10-10 (×5): qty 1

## 2019-10-10 MED ORDER — VITAMIN D 25 MCG (1000 UNIT) PO TABS
1000.0000 [IU] | ORAL_TABLET | Freq: Every day | ORAL | Status: DC
  Administered 2019-10-10 – 2019-10-12 (×3): 1000 [IU] via ORAL
  Filled 2019-10-10 (×3): qty 1

## 2019-10-10 MED ORDER — ADULT MULTIVITAMIN W/MINERALS CH
1.0000 | ORAL_TABLET | Freq: Every day | ORAL | Status: DC
  Administered 2019-10-10 – 2019-10-12 (×3): 1 via ORAL
  Filled 2019-10-10 (×3): qty 1

## 2019-10-10 MED ORDER — PRO-STAT SUGAR FREE PO LIQD: 30.0000 mL | Freq: Two times a day (BID) | ORAL | Status: DC

## 2019-10-10 MED ORDER — PNEUMOCOCCAL VAC POLYVALENT 25 MCG/0.5ML IJ INJ
0.5000 mL | INJECTION | INTRAMUSCULAR | Status: AC
  Administered 2019-10-11: 0.5 mL via INTRAMUSCULAR
  Filled 2019-10-10: qty 0.5

## 2019-10-10 MED ORDER — INSULIN DETEMIR 100 UNIT/ML ~~LOC~~ SOLN
4.0000 [IU] | Freq: Every day | SUBCUTANEOUS | Status: DC
  Administered 2019-10-11: 10:00:00 4 [IU] via SUBCUTANEOUS
  Filled 2019-10-10: qty 0.04

## 2019-10-10 MED ORDER — ENSURE ENLIVE PO LIQD
237.0000 mL | Freq: Two times a day (BID) | ORAL | Status: DC
  Administered 2019-10-10 – 2019-10-11 (×2): 237 mL via ORAL

## 2019-10-10 MED ORDER — ASPIRIN EC 81 MG PO TBEC
81.0000 mg | DELAYED_RELEASE_TABLET | Freq: Every day | ORAL | Status: DC
  Administered 2019-10-10 – 2019-10-12 (×3): 81 mg via ORAL
  Filled 2019-10-10 (×3): qty 1

## 2019-10-10 MED ORDER — INSULIN ASPART 100 UNIT/ML ~~LOC~~ SOLN
3.0000 [IU] | Freq: Three times a day (TID) | SUBCUTANEOUS | Status: DC
  Administered 2019-10-10 – 2019-10-11 (×2): 3 [IU] via SUBCUTANEOUS

## 2019-10-10 MED ORDER — ENSURE ENLIVE PO LIQD
237.0000 mL | Freq: Two times a day (BID) | ORAL | Status: DC
  Administered 2019-10-10: 237 mL via ORAL

## 2019-10-10 NOTE — Discharge Summary (Addendum)
Stroke Discharge Summary  Patient ID: [name] [name]   MRN: [id number]      DOB: Apr 20, 1934  Date of Admission: 10/08/2019 Date of Discharge: 10/12/2019  Attending Physician:  Rosalin Hawking, Stroke MD Consultant(s):   Leeroy Cha, MD (Physical Medicine & Rehabilitation), [facility] ([facility]) Patient's PCP:  Lajean Manes, MD  Discharge Diagnoses:  Principal Problem:   Seizures ([place]) with prolonged postictal period Active Problems:   Type 2 diabetes mellitus without complication, with long-term current use of insulin ([place])   Essential hypertension   Hyperlipidemia   Hyperglycemia   Malnutrition ([place])   Medications to be continued on discharge Allergies as of 10/12/2019      Reactions   Sulfonamide Derivatives Hives   Bactrim [sulfamethoxazole-trimethoprim] Rash   Reported by [facility]   Lisinopril Cough   Reported by [facility]      Medication List    STOP taking these medications   clopidogrel 75 MG tablet Commonly known as: PLAVIX     TAKE these medications   acetaminophen 500 MG tablet Commonly known as: TYLENOL Take 500-1,000 mg by mouth at bedtime as needed for mild pain.   aspirin EC 81 MG tablet Take 81 mg by mouth daily.   atorvastatin 40 MG tablet Commonly known as: LIPITOR Take 40 mg by mouth daily at 6 PM.   B-D UF III MINI PEN NEEDLES 31G X 5 MM Misc Generic drug: Insulin Pen Needle USE AS DIRECTED TID   calcium-vitamin D 250-100 MG-UNIT tablet Take 1 tablet by mouth daily.   cetirizine 10 MG tablet Commonly known as: ZYRTEC Take 10 mg by mouth daily as needed for allergies.   cholecalciferol 1000 units tablet Commonly known as: VITAMIN D Take 1,000 Units by mouth daily.   Fish Oil 1000 MG Caps Take 1,000 mg by mouth 2 (two) times daily.   Fluad 0.5 ML Susy Generic drug: Influenza Vac A&B Surf Ant Adj ADM 0.5ML IM UTD   Fusion Plus Caps Take 1 capsule by mouth daily.   Levemir FlexTouch 100 UNIT/ML  FlexPen Generic drug: insulin detemir Inject 4-6 Units into the skin See admin instructions. Inject 6 units in the morning and 4 units at bedtime.   levETIRAcetam 500 MG tablet Commonly known as: KEPPRA Take 1 tablet (500 mg total) by mouth 2 (two) times daily.   losartan 50 MG tablet Commonly known as: COZAAR Take 50 mg by mouth daily.   multivitamin with minerals Tabs tablet Take 1 tablet by mouth daily.   NovoLOG 100 UNIT/ML injection Generic drug: insulin aspart Inject 4-12 Units into the skin See admin instructions. Inject 10-12 units at breakfast, 7-9 units at lunch and 4-6 units at dinner.   nystatin 100000 UNIT/ML suspension Commonly known as: MYCOSTATIN Take 4 mLs by mouth at bedtime as needed.   pantoprazole 40 MG tablet Commonly known as: PROTONIX Take 40 mg by mouth daily.   polyethylene glycol 17 g packet Commonly known as: MIRALAX / GLYCOLAX Take 17 g by mouth daily as needed for mild constipation.   Synthroid 137 MCG tablet Generic drug: levothyroxine Take 137 mcg by mouth every morning.            Durable Medical Equipment  (From admission, onward)         Start     Ordered   10/11/19 1436  For home use only DME 3 n 1  Once     10/11/19 1435  LABORATORY STUDIES CBC    Component Value Date/Time   WBC 9.2 10/12/2019 0236   RBC 2.94 (L) 10/12/2019 0236   HGB 9.6 (L) 10/12/2019 0236   HGB 11.2 (L) 10/10/2008 1305   HCT 27.6 (L) 10/12/2019 0236   HCT 33.3 (L) 10/10/2008 1305   PLT 251 10/12/2019 0236   PLT 381 10/10/2008 1305   MCV 93.9 10/12/2019 0236   MCV 89.6 10/10/2008 1305   MCH 32.7 10/12/2019 0236   MCHC 34.8 10/12/2019 0236   RDW 13.2 10/12/2019 0236   RDW 13.3 10/10/2008 1305   LYMPHSABS 2.2 10/08/2019 0937   LYMPHSABS 0.9 10/10/2008 1305   MONOABS 0.6 10/08/2019 0937   MONOABS 0.3 10/10/2008 1305   EOSABS 0.1 10/08/2019 0937   EOSABS 0.0 10/10/2008 1305   BASOSABS 0.0 10/08/2019 0937   BASOSABS 0.0 10/10/2008  1305   CMP    Component Value Date/Time   NA 131 (L) 10/12/2019 0236   K 4.7 10/12/2019 0236   CL 97 (L) 10/12/2019 0236   CO2 26 10/12/2019 0236   GLUCOSE 153 (H) 10/12/2019 0236   BUN 21 10/12/2019 0236   CREATININE 1.10 (H) 10/12/2019 0236   CREATININE 0.98 09/25/2014 1654   CALCIUM 9.2 10/12/2019 0236   PROT 6.8 10/08/2019 0937   ALBUMIN 3.5 10/09/2019 0309   AST 35 10/08/2019 0937   ALT 32 10/08/2019 0937   ALKPHOS 66 10/08/2019 0937   BILITOT 0.7 10/08/2019 0937   GFRNONAA 46 (L) 10/12/2019 0236   GFRNONAA 55 (L) 09/25/2014 1654   GFRAA 54 (L) 10/12/2019 0236   GFRAA 64 09/25/2014 1654   COAGS Lab Results  Component Value Date   INR 1.0 10/08/2019   INR 1.1 08/21/2019   INR 0.9 08/17/2019   Lipid Panel    Component Value Date/Time   CHOL 272 (H) 10/09/2019 0309   TRIG 66 10/09/2019 0309   TRIG 68 10/09/2019 0309   HDL 117 10/09/2019 0309   CHOLHDL 2.3 10/09/2019 0309   VLDL 13 10/09/2019 0309   LDLCALC 142 (H) 10/09/2019 0309   HgbA1C  Lab Results  Component Value Date   HGBA1C 8.7 (H) 10/09/2019   Urinalysis    Component Value Date/Time   COLORURINE YELLOW 10/09/2019 2100   APPEARANCEUR CLEAR 10/09/2019 2100   LABSPEC 1.016 10/09/2019 2100   PHURINE 5.0 10/09/2019 2100   GLUCOSEU >=500 (A) 10/09/2019 2100   HGBUR NEGATIVE 10/09/2019 2100   BILIRUBINUR NEGATIVE 10/09/2019 2100   BILIRUBINUR neg 05/06/2015 1421   KETONESUR 5 (A) 10/09/2019 2100   PROTEINUR 100 (A) 10/09/2019 2100   UROBILINOGEN 1.0 05/06/2015 1421   UROBILINOGEN 0.2 12/25/2014 1206   NITRITE NEGATIVE 10/09/2019 2100   LEUKOCYTESUR NEGATIVE 10/09/2019 2100   Urine Drug Screen     Component Value Date/Time   LABOPIA NONE DETECTED 08/21/2019 1030   COCAINSCRNUR NONE DETECTED 08/21/2019 1030   LABBENZ NONE DETECTED 08/21/2019 1030   AMPHETMU NONE DETECTED 08/21/2019 1030   THCU NONE DETECTED 08/21/2019 1030   LABBARB NONE DETECTED 08/21/2019 1030    Alcohol Level     Component Value Date/Time   ETH <10 08/21/2019 0750    SIGNIFICANT DIAGNOSTIC STUDIES CT Code Stroke CTA Head W/WO contrast  Result Date: 10/08/2019 CLINICAL DATA:  Aphasia. EXAM: CT ANGIOGRAPHY HEAD AND NECK TECHNIQUE: Multidetector CT imaging of the head and neck was performed using the standard protocol during bolus administration of intravenous contrast. Multiplanar CT image reconstructions and MIPs were obtained to evaluate the vascular anatomy.  Carotid stenosis measurements (when applicable) are obtained utilizing NASCET criteria, using the distal internal carotid diameter as the denominator. CONTRAST:  Administered contrast not known at this time COMPARISON:  Noncontrast head CT 10/08/2019, MRI/MRA head 08/21/2019. FINDINGS: CTA NECK FINDINGS Aortic arch: Standard aortic branching. Calcified atherosclerotic plaque within the visualized aortic arch and proximal major branch vessels of the neck. No significant innominate or proximal subclavian artery stenosis. Right carotid system: See seen ICA patent within the neck without stenosis. Minimal calcified plaque within the carotid bifurcation. Left carotid system: CCA and ICA patent within the neck without stenosis. Mild calcified plaque within the carotid bifurcation and proximal ICA. Vertebral arteries: The vertebral arteries are codominant and patent within the neck bilaterally. Calcified plaque at the origin the right vertebral artery with resultant mild ostial stenosis. Skeleton: Cervical spondylosis. Most notably there is advanced disc height loss with prominent degenerative endplate irregularity and degenerative endplate sclerosis at D34-534 and T1-T2. Other neck: No neck mass or cervical lymphadenopathy. Upper chest: No consolidation within the imaged lung apices. Review of the MIP images confirms the above findings CTA HEAD FINDINGS Anterior circulation: The intracranial internal carotid arteries are patent bilaterally with scattered calcified  plaque. Sites of mild to moderate stenosis within the cavernous and paraclinoid right ICA. No more than mild narrowing of the left ICA. The M1 middle cerebral arteries are patent without significant stenosis. Mild-to-moderate stenosis at the origin of a right M2 MCA branch (series 7, image. 99). Moderate stenosis at the origin of a right M2 MCA branch vessel (series 7, image 101). The anterior cerebral arteries are patent without high-grade proximal stenosis. No intracranial aneurysm is identified. Posterior circulation: The intracranial vertebral arteries are patent without significant stenosis, as is the basilar artery. The posterior cerebral arteries are patent bilaterally. Multifocal sites of moderate stenosis within the P2 right posterior cerebral artery. Posterior communicating arteries are poorly delineated and may be hypoplastic or absent bilaterally. Venous sinuses: Within limitations of contrast timing, no convincing thrombus. Anatomic variants: As described Review of the MIP images confirms the above findings No emergent large vessel occlusion identified. These results were called by telephone at the time of interpretation on 10/08/2019 at 10:30 am to provider [facility] , who verbally acknowledged these results. IMPRESSION: CTA neck: 1. The bilateral common and internal carotid arteries are patent within the neck without significant stenosis. Mild plaque in the bilateral carotid systems as described. 2. The vertebral arteries are patent within the neck bilaterally. Calcified plaque results in mild ostial stenosis on the right. CTA head: 1. No intracranial large vessel occlusion. 2. Mild-to-moderate stenosis at the origin of a left M2 MCA branch vessel. 3. Moderate stenosis at the origin of a right M2 MCA branch vessel. 4. Multifocal moderate stenoses within the P2 right posterior cerebral artery. 5. Atherosclerotic calcifications within the intracranial internal carotid arteries. Sites of mild to  moderate stenosis within the cavernous and paraclinoid right ICA. No more than mild narrowing of the left ICA. Electronically Signed   By: Kellie Simmering DO   On: 10/08/2019 10:41   CT HEAD WO CONTRAST  Result Date: 10/08/2019 CLINICAL DATA:  Post tPA stroke, follow-up. EXAM: CT HEAD WITHOUT CONTRAST TECHNIQUE: Contiguous axial images were obtained from the base of the skull through the vertex without intravenous contrast. COMPARISON:  Head CT 10/08/2019 FINDINGS: Brain: There is residual circulating contrast material. There is no evidence of acute intracranial hemorrhage, intracranial mass, midline shift or extra-axial fluid collection.No demarcated cortical infarction. Stable generalized  parenchymal atrophy and chronic small vessel ischemic disease. Vascular: Circulating contrast material precludes evaluation for hyperdense vessels. Skull: Normal. Negative for fracture or focal lesion. Sinuses/Orbits: Visualized orbits demonstrate no acute abnormality. No significant paranasal sinus disease or mastoid effusion at the imaged levels. IMPRESSION: No evidence of acute intracranial abnormality. Stable generalized parenchymal atrophy and chronic small vessel ischemic disease. Electronically Signed   By: Kellie Simmering DO   On: 10/08/2019 14:32   CT Code Stroke CTA Neck W/WO contrast  Result Date: 10/08/2019 CLINICAL DATA:  Aphasia. EXAM: CT ANGIOGRAPHY HEAD AND NECK TECHNIQUE: Multidetector CT imaging of the head and neck was performed using the standard protocol during bolus administration of intravenous contrast. Multiplanar CT image reconstructions and MIPs were obtained to evaluate the vascular anatomy. Carotid stenosis measurements (when applicable) are obtained utilizing NASCET criteria, using the distal internal carotid diameter as the denominator. CONTRAST:  Administered contrast not known at this time COMPARISON:  Noncontrast head CT 10/08/2019, MRI/MRA head 08/21/2019. FINDINGS: CTA NECK FINDINGS Aortic  arch: Standard aortic branching. Calcified atherosclerotic plaque within the visualized aortic arch and proximal major branch vessels of the neck. No significant innominate or proximal subclavian artery stenosis. Right carotid system: See seen ICA patent within the neck without stenosis. Minimal calcified plaque within the carotid bifurcation. Left carotid system: CCA and ICA patent within the neck without stenosis. Mild calcified plaque within the carotid bifurcation and proximal ICA. Vertebral arteries: The vertebral arteries are codominant and patent within the neck bilaterally. Calcified plaque at the origin the right vertebral artery with resultant mild ostial stenosis. Skeleton: Cervical spondylosis. Most notably there is advanced disc height loss with prominent degenerative endplate irregularity and degenerative endplate sclerosis at D34-534 and T1-T2. Other neck: No neck mass or cervical lymphadenopathy. Upper chest: No consolidation within the imaged lung apices. Review of the MIP images confirms the above findings CTA HEAD FINDINGS Anterior circulation: The intracranial internal carotid arteries are patent bilaterally with scattered calcified plaque. Sites of mild to moderate stenosis within the cavernous and paraclinoid right ICA. No more than mild narrowing of the left ICA. The M1 middle cerebral arteries are patent without significant stenosis. Mild-to-moderate stenosis at the origin of a right M2 MCA branch (series 7, image. 99). Moderate stenosis at the origin of a right M2 MCA branch vessel (series 7, image 101). The anterior cerebral arteries are patent without high-grade proximal stenosis. No intracranial aneurysm is identified. Posterior circulation: The intracranial vertebral arteries are patent without significant stenosis, as is the basilar artery. The posterior cerebral arteries are patent bilaterally. Multifocal sites of moderate stenosis within the P2 right posterior cerebral artery. Posterior  communicating arteries are poorly delineated and may be hypoplastic or absent bilaterally. Venous sinuses: Within limitations of contrast timing, no convincing thrombus. Anatomic variants: As described Review of the MIP images confirms the above findings No emergent large vessel occlusion identified. These results were called by telephone at the time of interpretation on 10/08/2019 at 10:30 am to provider [facility] , who verbally acknowledged these results. IMPRESSION: CTA neck: 1. The bilateral common and internal carotid arteries are patent within the neck without significant stenosis. Mild plaque in the bilateral carotid systems as described. 2. The vertebral arteries are patent within the neck bilaterally. Calcified plaque results in mild ostial stenosis on the right. CTA head: 1. No intracranial large vessel occlusion. 2. Mild-to-moderate stenosis at the origin of a left M2 MCA branch vessel. 3. Moderate stenosis at the origin of a right M2 MCA  branch vessel. 4. Multifocal moderate stenoses within the P2 right posterior cerebral artery. 5. Atherosclerotic calcifications within the intracranial internal carotid arteries. Sites of mild to moderate stenosis within the cavernous and paraclinoid right ICA. No more than mild narrowing of the left ICA. Electronically Signed   By: Kellie Simmering DO   On: 10/08/2019 10:41   MR BRAIN WO CONTRAST  Result Date: 10/09/2019 CLINICAL DATA:  Stroke symptoms.  Aphasia.  23 hours post tPA. EXAM: MRI HEAD WITHOUT CONTRAST TECHNIQUE: Multiplanar, multiecho pulse sequences of the brain and surrounding structures were obtained without intravenous contrast. COMPARISON:  CT head 10/08/2019 FINDINGS: Brain: Negative for acute infarct. Generalized atrophy. Mild chronic white matter changes bilaterally. Brainstem intact. Small chronic infarct left cerebellum. Negative for hemorrhage or mass. Vascular: Normal arterial flow voids. Skull and upper cervical spine: No focal  skeletal lesion identified. Sinuses/Orbits: Mild mucosal edema paranasal sinuses. Bilateral cataract extraction. Other: None IMPRESSION: Negative for acute infarct. Atrophy with mild chronic microvascular ischemic change in the white matter. Electronically Signed   By: Franchot Gallo M.D.   On: 10/09/2019 10:20   US ARTERIAL SEG MULTIPLE LE (ABI, SEGMENTAL PRESSURES, PVR'S)  Result Date: 09/15/2019 CLINICAL DATA:  Peripheral vascular disease, hypertension, diabetes, bilateral lower extremity claudication EXAM: NONINVASIVE PHYSIOLOGIC VASCULAR STUDY OF BILATERAL LOWER EXTREMITIES TECHNIQUE: Non-invasive vascular evaluation of both lower extremities was performed at rest, including calculation of ankle-brachial indices, multiple segmental pressure evaluation, segmental Doppler and segmental pulse volume recording. COMPARISON:  09/26/2018 FINDINGS: Right Lower Extremity Resting ABI:  0.97 Resting TBI: 0.45 Segmental Pressures: Predominantly infrapopliteal occlusive disease. Great toe pressure: 58 mmHg Arterial Waveforms: Multiphasic through the popliteal artery, monophasic distally. PVRs: Normal PVRs with maintained waveform amplitude, augmentation and quality. Left Lower Extremity: Resting ABI: 0.66 Resting TBI: 0.31 Segmental Pressures: Popliteal and infrapopliteal occlusive components. Great toe pressure: 40 mmHg Arterial Waveforms: Monophasic throughout. PVRs: Normal PVRs with maintained waveform amplitude, augmentation and quality. Other: Symmetric upper extremity pressures. Ankle Brachial index > 1.4 Non diagnostic secondary to incompressible vessel calcifications 1.0-1.4       Normal 0.9-0.99     Borderline PAD 0.8-0.89     Mild PAD 0.5-0.79     Moderate PAD < 0.5          Severe PAD Toe Brachial Index Normal     >0.65 Moderate  0.53-0.64 Severe     <0.23 Toe Pressures Absolute toe pressure >14mmHg sufficient for wound healing. Toe pressures <51mmHg = critical limb ischemia. IMPRESSION: 1. Moderate right  infrapopliteal arterial occlusive disease. 2. Moderate multi-segmental left lower extremity arterial occlusive disease. Recorded toe pressure consistent with critical limb ischemia. Electronically Signed   By: Lucrezia Europe M.D.   On: 09/15/2019 15:42   DG Chest Portable 1 View  Result Date: 10/08/2019 CLINICAL DATA:  Acute mental status change EXAM: PORTABLE CHEST 1 VIEW COMPARISON:  August 17, 2019 FINDINGS: The heart size and mediastinal contours are within normal limits. Both lungs are clear. The visualized skeletal structures are unremarkable. IMPRESSION: No active disease. Electronically Signed   By: Dorise Bullion III M.D   On: 10/08/2019 10:42   EEG adult  Result Date: 10/09/2019 Lora Havens, MD     10/09/2019 12:26 PM Patient Name: [name] [name] MRN: [id number] Epilepsy Attending: Lora Havens Referring Physician/Provider: Dr. Rosalin Hawking Date: 10/09/2019 Duration: 22.22 minutes Patient history: 86 year old female who presented as a code of stroke for altered mental status and aphasia.  MRI brain did not show acute stroke.  EEG evaluate for seizures. Level of alertness: Lethargic AEDs during EEG study: None Technical aspects: This EEG study was done with scalp electrodes positioned according to the 10-20 International system of electrode placement. Electrical activity was acquired at a sampling rate of 500Hz  and reviewed with a high frequency filter of 70Hz  and a low frequency filter of 1Hz . EEG data were recorded continuously and digitally stored. Description: No clear posterior rhythm was seen.  EEG showed condyle generalized low amplitude 2 to 3 Hz delta slowing.  Intermittent triphasic waves, generalized, maximal bifrontal were also noted.  Hyperventilation and photic stimulation were not performed. Abnormality -Continuous slow, generalized -Triphasic waves, generalized IMPRESSION: This study is suggestive of moderate to severe diffuse encephalopathy, nonspecific etiology could be  secondary toxic-metabolic causes.  No seizures or epileptiform discharges were seen throughout the recording. Priyanka Barbra Sarks   Overnight EEG with video  Result Date: 10/11/2019 Lora Havens, MD     10/11/2019  2:29 PM Patient Name: [name] [name] MRN: [id number] Epilepsy Attending: Lora Havens Referring Physician/Provider: Dr. Rosalin Hawking Duration: 10/10/2019 [id number] to 10/11/2019 [id number]  Patient history: 86 year old female who presented as a code of stroke for altered mental status and aphasia.  MRI brain did not show acute stroke.  EEG evaluate for seizures.  Level of alertness: Lethargic, asleep  AEDs during EEG study: None  Technical aspects: This EEG study was done with scalp electrodes positioned according to the 10-20 International system of electrode placement. Electrical activity was acquired at a sampling rate of 500Hz  and reviewed with a high frequency filter of 70Hz  and a low frequency filter of 1Hz . EEG data were recorded continuously and digitally stored. Description: The posterior dominant rhythm consists of 7-8 Hz activity of moderate voltage (25-35 uV) seen predominantly in posterior head regions, symmetric and reactive to eye opening and eye closing. Sleep was characterized by vertex waves, sleep spindles (12-14hz ), maximal frontocentral. EEG also showed continuous generalized low amplitude 5-7Hz  theta slowing.  Hyperventilation and photic stimulation were not performed.  Abnormality -Continuous slow, generalized  IMPRESSION: This study is suggestive of mild diffuse encephalopathy, nonspecific etiology.  No seizures or epileptiform discharges were seen throughout the recording. EEG appears improved compared to previous day.  Lora Havens   ECHOCARDIOGRAM COMPLETE  Result Date: 10/09/2019    ECHOCARDIOGRAM REPORT   Patient Name:   [name] [name] Date of Exam: 10/09/2019 Medical Rec #:  [id number]       Height:       65.0 in Accession #:    [id number]      Weight:       138.4 lb  Date of Birth:  1933/11/17       BSA:          1.692 m Patient Age:    51 years        BP:           115/77 mmHg Patient Gender: F               HR:           88 bpm. Exam Location:  Inpatient Procedure: 2D Echo, Cardiac Doppler and Color Doppler Indications:    Stroke 434.91 / I163.9  History:        Patient has prior history of Echocardiogram examinations, most                 recent 08/22/2019. Risk Factors:Hypertension, Diabetes,  Dyslipidemia and GERD. CKD. Hypothyroidism.  Sonographer:    Jonelle Sidle Dance Referring Phys: [id number] [place]  1. Left ventricular ejection fraction, by estimation, is 65 to 70%. The left ventricle has normal function. The left ventricle has no regional wall motion abnormalities. Left ventricular diastolic parameters are consistent with Grade I diastolic dysfunction (impaired relaxation).  2. Right ventricular systolic function is normal. The right ventricular size is normal. There is mildly elevated pulmonary artery systolic pressure.  3. The mitral valve is abnormal. Mild mitral valve regurgitation.  4. The aortic valve is tricuspid. Aortic valve regurgitation is trivial. Mild aortic valve sclerosis is present, with no evidence of aortic valve stenosis. Comparison(s): Changes from prior study are noted. 08/22/19: LVEF 60-65%. FINDINGS  Left Ventricle: Left ventricular ejection fraction, by estimation, is 65 to 70%. The left ventricle has normal function. The left ventricle has no regional wall motion abnormalities. The left ventricular internal cavity size was normal in size. There is  no left ventricular hypertrophy. Left ventricular diastolic parameters are consistent with Grade I diastolic dysfunction (impaired relaxation). Indeterminate filling pressures. Right Ventricle: The right ventricular size is normal. No increase in right ventricular wall thickness. Right ventricular systolic function is normal. There is mildly elevated pulmonary artery  systolic pressure. The tricuspid regurgitant velocity is 3.03  m/s, and with an assumed right atrial pressure of 3 mmHg, the estimated right ventricular systolic pressure is [id number]. Left Atrium: Left atrial size was normal in size. Right Atrium: Right atrial size was normal in size. Pericardium: There is no evidence of pericardial effusion. Mitral Valve: The mitral valve is abnormal. There is mild thickening of the mitral valve leaflet(s). Mild mitral valve regurgitation. Tricuspid Valve: The tricuspid valve is grossly normal. Tricuspid valve regurgitation is trivial. Aortic Valve: The aortic valve is tricuspid. Aortic valve regurgitation is trivial. Mild aortic valve sclerosis is present, with no evidence of aortic valve stenosis. Pulmonic Valve: The pulmonic valve was grossly normal. Pulmonic valve regurgitation is trivial. Aorta: The aortic root and ascending aorta are structurally normal, with no evidence of dilitation. IAS/Shunts: No atrial level shunt detected by color flow Doppler.  LEFT VENTRICLE PLAX 2D LVIDd:         3.78 cm  Diastology LVIDs:         2.45 cm  LV e' lateral:   8.24 cm/s LV PW:         0.84 cm  LV E/e' lateral: 12.1 LV IVS:        0.82 cm  LV e' medial:    7.15 cm/s LVOT diam:     1.90 cm  LV E/e' medial:  13.9 LV SV:         72 LV SV Index:   43 LVOT Area:     2.84 cm  RIGHT VENTRICLE             IVC RV Basal diam:  3.17 cm     IVC diam: 1.75 cm RV Mid diam:    2.12 cm RV S prime:     16.80 cm/s TAPSE (M-mode): 2.5 cm LEFT ATRIUM             Index       RIGHT ATRIUM           Index LA diam:        2.90 cm 1.71 cm/m  RA Area:     13.00 cm LA Vol (A2C):   47.2 ml 27.90 ml/m RA Volume:  31.90 ml  18.85 ml/m LA Vol (A4C):   37.3 ml 22.05 ml/m LA Biplane Vol: 42.0 ml 24.82 ml/m  AORTIC VALVE LVOT Vmax:   122.00 cm/s LVOT Vmean:  72.900 cm/s LVOT VTI:    0.255 m  AORTA Ao Root diam: 3.10 cm Ao Asc diam:  2.60 cm MITRAL VALVE                 TRICUSPID VALVE MV Area (PHT): 3.31 cm       TR Peak grad:   36.7 mmHg MV Decel Time: 229 msec      TR Vmax:        303.00 cm/s MR Peak grad:    193.8 mmHg MR Mean grad:    126.0 mmHg  SHUNTS MR Vmax:         696.00 cm/s Systemic VTI:  0.26 m MR Vmean:        528.0 cm/s  Systemic Diam: 1.90 cm MR PISA:         0.57 cm MR PISA Eff ROA: 3 mm MR PISA Radius:  0.30 cm MV E velocity: 99.40 cm/s MV A velocity: 117.00 cm/s MV E/A ratio:  0.85 Lyman Bishop MD Electronically signed by Lyman Bishop MD Signature Date/Time: 10/09/2019/11:03:32 AM    Final    CUP PACEART REMOTE DEVICE CHECK  Result Date: 09/25/2019 [facility] summary report received. Battery status OK. Normal device function. No new symptom episodes, tachy episodes, brady, or pause episodes. No new AF episodes. Monthly summary reports and ROV/PRN    [facility]  CT HEAD CODE STROKE WO CONTRAST  Result Date: 10/08/2019 CLINICAL DATA:  Code stroke. Focal neuro deficit, greater than 6 hours, stroke suspected. Additional history provided: Aphasia. EXAM: CT HEAD WITHOUT CONTRAST TECHNIQUE: Contiguous axial images were obtained from the base of the skull through the vertex without intravenous contrast. COMPARISON:  MRI/MRA head 09/10/2019, head CT 08/21/2019 FINDINGS: Brain: The examination is mildly motion degraded. There is no evidence of acute intracranial hemorrhage, intracranial mass, midline shift or extra-axial fluid collection.No demarcated cortical infarction. Ill-defined hypoattenuation within the cerebral white matter is nonspecific, but consistent with chronic small vessel ischemic disease. Stable, mild generalized parenchymal atrophy. Vascular: No hyperdense vessel.  Atherosclerotic calcifications. Skull: Normal. Negative for fracture or focal lesion. Sinuses/Orbits: Visualized orbits demonstrate no acute abnormality. Mild ethmoid sinus mucosal thickening. Trace fluid within left mastoid air cells. IMPRESSION: Mildly motion degraded exam. No evidence of acute intracranial abnormality.  Generalized parenchymal atrophy and chronic small vessel ischemic disease. Mild ethmoid sinus mucosal thickening. Trace fluid within left mastoid air cells. Electronically Signed   By: Kellie Simmering DO   On: 10/08/2019 10:00   VAS [place] LOWER EXTREMITY VENOUS (DVT)  Result Date: 10/09/2019  Lower Venous DVTStudy Indications: Stroke.  Limitations: Patient movement. Comparison Study: no prior Performing Technologist: Abram Sander RVS  Examination Guidelines: A complete evaluation includes B-mode imaging, spectral Doppler, color Doppler, and power Doppler as needed of all accessible portions of each vessel. Bilateral testing is considered an integral part of a complete examination. Limited examinations for reoccurring indications may be performed as noted. The reflux portion of the exam is performed with the patient in reverse Trendelenburg.  +---------+---------------+---------+-----------+----------+--------------+ RIGHT    CompressibilityPhasicitySpontaneityPropertiesThrombus Aging +---------+---------------+---------+-----------+----------+--------------+ CFV      Full           Yes      Yes                                 +---------+---------------+---------+-----------+----------+--------------+  SFJ      Full                                                        +---------+---------------+---------+-----------+----------+--------------+ FV Prox  Full                                                        +---------+---------------+---------+-----------+----------+--------------+ FV Mid   Full                                                        +---------+---------------+---------+-----------+----------+--------------+ FV DistalFull                                                        +---------+---------------+---------+-----------+----------+--------------+ PFV      Full                                                         +---------+---------------+---------+-----------+----------+--------------+ POP      Full           Yes      Yes                                 +---------+---------------+---------+-----------+----------+--------------+ PTV      Full                                                        +---------+---------------+---------+-----------+----------+--------------+ PERO     Full                                                        +---------+---------------+---------+-----------+----------+--------------+   +---------+---------------+---------+-----------+----------+--------------+ LEFT     CompressibilityPhasicitySpontaneityPropertiesThrombus Aging +---------+---------------+---------+-----------+----------+--------------+ CFV      Full           Yes      Yes                                 +---------+---------------+---------+-----------+----------+--------------+ SFJ      Full                                                        +---------+---------------+---------+-----------+----------+--------------+  FV Prox  Full                                                        +---------+---------------+---------+-----------+----------+--------------+ FV Mid   Full                                                        +---------+---------------+---------+-----------+----------+--------------+ FV DistalFull                                                        +---------+---------------+---------+-----------+----------+--------------+ PFV      Full                                                        +---------+---------------+---------+-----------+----------+--------------+ POP      Full           Yes      Yes                                 +---------+---------------+---------+-----------+----------+--------------+ PTV      Full                                                         +---------+---------------+---------+-----------+----------+--------------+ PERO                                                  Not visualized +---------+---------------+---------+-----------+----------+--------------+     Summary: BILATERAL: - No evidence of deep vein thrombosis seen in the lower extremities, bilaterally.   *See table(s) above for measurements and observations. Electronically signed by Curt Jews MD on 10/09/2019 at 3:35:20 PM.    Final        HISTORY OF PRESENT ILLNESS [name] [name] is an 86 y.o. female  With PMH DM2, HTN, HLD, CKD who presented to [place] ED as a code stroke for AMS, aphasia.  Her daughter reports that she got up and appeared to be acting normally, though she had not spoken with anyone yet this morning.  She made her bed and did her normal activities of daily living.  Her daughter then saw her appear to become lightheaded, and sit down in her room.  She then proceeded to the kitchen where she then sat down and was acting something was not right.  Because of this EMS was activated.  She initially was able to speak relatively well, but started having some difficulty with her speech on route and  because of this a code stroke was activated. She was LKW 10/08/2019 at 0830. Modified Rankin: Rankin Score=1  On arrival to the emergency department, she was still able to answer questions and I asked her if she felt normal this morning, and she states that she did before having sudden onset of "something just not being right."  When she began speaking to the EMS folks, she was already having some difficulty but continued to worsen on route.  Initially because she had not spoken before symptom onset, EMS did not give a clear last known well, however on discussing with the patient she is very clear that she felt normal prior to about 8:30 AM. Family also reported increased frequency of bowel movements recently. NIHSS:8. CTH: no hemorrhage, CTA: no  LVO, BP: 165/70 BG: 159. tPA Given and pt was admitted to neuro ICU.  HOSPITAL COURSE [name] [name] is a 86 y.o. female with history of HTN, DB, HLD, CKD presenting with altered mental status and global aphasia.  tPA administered 10/08/2019 at 1013.  Likely seizure with prolonged post ictal state  Code Stroke CT head No acute abnormality. Small vessel disease. Atrophy. Sinus dz.     CTA head no LVO. L M2/MCA branch w/ mild to moderate origin stenosis. R M2/MCA moderate origin stenosis. R P2/PCA multifocal moderate stenosis. B ICA atherosclerosis w/ R ICA cavernous and paraclinoid sites mild to moderate stenoses. L ICA mild narrowing.  CTA neck B ICA mild plaque. R VA mild ostial stenosis.  Repeat CT head no acute abnormality. Small vessel disease. Atrophy.   MRI  No acute infarct. Small vessel disease. Atrophy.   2D Echo EF 65-70%  EEG no sz, moderate to severe diffuse encephalopathy  LT EEG neg sz   LE doppler no DVT  Loop interrogation requested no A. fib detected  LDL 142  HgbA1c 8.7  TSH normal, FT4 1.48(h), thyroid peroxidase antibody neg, Thyroglobulin antibody slightly elevated at 1.2 w/o clinical significance  Continue keppra 500mg  bid  aspirin 81 mg daily and clopidogrel 75 mg daily prior to admission,  on aspirin 81 mg daily. Continue aspirin alone at d/c.  Therapy recommendations:  CIR - rehab consulted but no rehab supported dx for IP care -> HH PT and OT, DME 3N1  Disposition:  return home w/ family  According to [facility] law, pt can not drive until seizure free for 6 months and under physician's care.   Plan for d/c 3/24. IM consulted who saw and agreed with d/c on 3/25. Orders placed  Fever with AMS, resolved  Tmax 100.3->100.7->afebrile  WBC 9.7->8.6->7.8->9.2  Clinical presentation does not consistent with Clinical CNS infection  Not able to do LP due to plavix only be off for 2 days  Blood culture no growth 3 days  UA neg  CXR -  neg  Ammonia level normal  Empiric antibiotics for CNS infection coverage - stopped   EEG no sz  Hx TIA vs. seizure ? 08/2019 - TIA vs. Seizure presenting with R arm weakness, with diaphoretic episode and passed out for 2 min. DAPT x 3 weeks. MRI and MRA neg. CUS neg. EF 60-65%. EEG neg. LDL 133 and A1C 8.6. Loop placed.  Hypertension  Home meds:  Losartan 50  Stable  Resume home losartan  BP goal normotensive  Hyperlipidemia  Home meds:  lipitor 40 and omega 3, resumed in hospital  LDL 142, goal < 70  Continue statin at discharge  Diabetes type II Uncontrolled Hyperglycemia  Home meds:  levemir 6uam, 4u - resumed in hospital  HgbA1c 8.7, goal < 7.0  Hyperglycemia with nighttime episodes of hypoglycemia   DB RN followed in hospital  SSI ac& hs per pt home regimen dose and levemir 6u/4u -> 4u/4u-> 6/4  Uncontrolled hyperglycemia - internal medicine Dr. Roosevelt Locks consulted - appreciate help  Continue home diabetic regimen at d/c  Dysphagia   Cleared for diet - D3 thin carb mod/heart healthy   Resumed home meds   Other Stroke Risk Factors  Advanced age  Hx smokeless tobacco use, quit 36 yrs ago   Other Active Problems  Hypothyroidism on synthroid  GERD on PPI  CKD 1.26->1.27->1.20->1.10->1.10  Hyponatremia 127->133->129->131->131  Chronic normocytic anemia 11.5->10.7->9.6->10.3->9.6  DISCHARGE EXAM Blood pressure (!) 126/48, pulse 64, temperature 97.9 F (36.6 C), temperature source Oral, resp. rate 18, height 5\' 5"  (1.651 m), weight 62.8 kg, SpO2 99 %. General - Well nourished, well developed, in no apparent distress.   Ophthalmologic - fundi not visualized due to noncooperation.  Cardiovascular - Regular rhythm and rate.  Mental Status -  Level of arousal and orientation to time, place, and person were intact. Language including expression, naming, repetition, comprehension was assessed and found intact.  But paucity of  speech  Cranial Nerves II - XII - II - Visual field intact OU. III, IV, VI - Extraocular movements intact. V - Facial sensation intact bilaterally. VII - Facial movement intact bilaterally. VIII - Hearing & vestibular intact bilaterally. X - Palate elevates symmetrically. XI - Chin turning & shoulder shrug intact bilaterally. XII - Tongue protrusion intact.  Motor Strength - The patient's strength was symmetrical in all extremities and pronator drift was absent.  Bulk was normal and fasciculations were absent.   Motor Tone - Muscle tone was assessed at the neck and appendages and was normal.  Reflexes - The patient's reflexes were symmetrical in all extremities and she had no pathological reflexes.  Sensory - Light touch, temperature/pinprick were assessed and were symmetrical.    Coordination - The patient had normal movements in the hands with no ataxia or dysmetria.  Tremor was absent.  Gait and Station - deferred.  Discharge Diet  Dysphagia 3 thin liquids  DISCHARGE PLAN  Disposition:  D/c home  Home health PT and OT, DME 3N1   aspirin 81 mg daily for secondary stroke prevention   Seizure Precautions x 6 mos  Resume home diabetic regimen and f/u w/ Dr. Buddy Duty in 1-2 weeks  Ongoing stroke risk factor control by Primary Care Physician   Follow-up PCP Lajean Manes, MD in 2 weeks following discharge   Follow-up in [facility] in 4 weeks, office to schedule an appointment.   40 minutes were spent preparing discharge.  Burnetta Sabin, MSN, APRN, ANVP-BC, AGPCNP-BC Advanced Practice Stroke Nurse [place] for Schedule & Pager information 10/12/2019 11:59 AM

## 2019-10-10 NOTE — Progress Notes (Signed)
Initial Nutrition Assessment  DOCUMENTATION CODES:   Not applicable  INTERVENTION:   Ensure Enlive po BID, each supplement provides 350 kcal and 20 grams of protein  Encourage PO intake   NUTRITION DIAGNOSIS:   Inadequate oral intake related to decreased appetite as evidenced by per patient/family report.  GOAL:   Patient will meet greater than or equal to 90% of their needs  MONITOR:   PO intake, Supplement acceptance  REASON FOR ASSESSMENT:   Malnutrition Screening Tool    ASSESSMENT:   Pt with PMH of DM, HTN, HLD and CKD admitted with AMS and aphasia s/p tPA.   Pt discussed during ICU rounds and with RN.  Pt on continuous EEG during assessment to rule out seizures. Pt much more alert today vs reports from yesterday. Pt able to answer questions.   History obtained from family member and pt.  Per pt she has lost weight recently. Per family pt is 135-140 lb and was 135 lb at a recent doctor visit. Per admission weight pt was 138 lb.  Pt can cook for herself but she also has help from family. Per family she eats very small portions.   24 hr recall:  Breakfast - eggs and bacon Lunch- (biggest meal of the day) could be take out or cooked at home (meat, veggie, starch) Dinner- (lightest meal of the day) beans and cornbread   Pt ate 50% of dinner last night and 75% of her breakfast this am.   Medications reviewed and include: vitamin D3, SSI, 4 units levemir daily at bedtime, 6 units levemir daily, MVI  Labs reviewed: Na 129 (L) CBG's: [phone]  NUTRITION - FOCUSED PHYSICAL EXAM:    Most Recent Value  Orbital Region  No depletion  Upper Arm Region  No depletion  Thoracic and Lumbar Region  No depletion  Buccal Region  No depletion  Temple Region  Mild depletion  Clavicle Bone Region  Mild depletion  Clavicle and Acromion Bone Region  No depletion  Scapular Bone Region  Unable to assess  Dorsal Hand  Mild depletion  Patellar Region  No depletion  Anterior  Thigh Region  No depletion  Posterior Calf Region  No depletion  Edema (RD Assessment)  -- [facial]  Hair  Reviewed  Eyes  Reviewed  Mouth  Reviewed  Skin  Reviewed  Nails  Reviewed       Diet Order:   Diet Order            DIET DYS 3 Room service appropriate? Yes; Fluid consistency: Thin  Diet effective now              EDUCATION NEEDS:   No education needs have been identified at this time  Skin:  Skin Assessment: Reviewed RN Assessment(ecchymosis on face)  Last BM:  unknown  Height:   Ht Readings from Last 1 Encounters:  10/08/19 5\' 5"  (1.651 m)    Weight:   Wt Readings from Last 1 Encounters:  10/08/19 62.8 kg    Ideal Body Weight:  56.8 kg  BMI:  Body mass index is 23.04 kg/m.  Estimated Nutritional Needs:   Kcal:  1500-1700  Protein:  70-80 grams  Fluid:  > 1.5 L/day  Lockie Pares., RD, LDN, CNSC See AMiON for contact information

## 2019-10-10 NOTE — Progress Notes (Signed)
PT Cancellation Note  Patient Details Name: [name] [name] MRN: [id number] DOB: 02/22/1934   Cancelled Treatment:    Reason Eval/Treat Not Completed: Patient at procedure or test/unavailable  Pt undergoing EEG. Will reattempt later today as schedule permits.   Arby Barrette, PT Pager [phone]  Rexanne Mano 10/10/2019, 10:55 AM

## 2019-10-10 NOTE — Progress Notes (Signed)
  Speech Language Pathology Treatment: Dysphagia  Patient Details Name: [name] [name] MRN: [id number] DOB: [birth year] Today's Date: 10/10/2019 Time: 0950-1009 SLP Time Calculation (min) (ACUTE ONLY): 19 min  Assessment / Plan / Recommendation Clinical Impression  Pt encountered with breakfast tray and family in room. Mentation and verbal output improved compared to previous date. Pt offered thin liquids, puree, mixed consistencies, and regular solids. Pt noted with functional, but slightly prolonged oral clearance, requiring intermittent checks for oral residue in between bites. Overall, pt has good lingual control and clears mouth with minimal cues. Pt's family also stated they have already been doing this throughout the pt's meals. No s/sx of aspiration were observed following all trials. Given potential for fluctuating mental status, recommend Dys 3 and thin liquids. Will continue to f/u.    HPI HPI: [name] [name] is an 86 y.o. female  With PMH DM2, HTN, HLD, CKD who presented to [facility] ED as a code stroke for AMS, aphasia. CT of her head was showing no acute intracranial abnormality with stable generalized parenchymal atrophy and chronic small vessell ischemis disease. Chest xray was showing no active disease. MRI negative for acute infarct.       SLP Plan  Continue with current plan of care       Recommendations  Diet recommendations: Dysphagia 3 (mechanical soft);Thin liquid Liquids provided via: Straw;Cup Medication Administration: Crushed with puree Supervision: Full supervision/cueing for compensatory strategies Compensations: Minimize environmental distractions;Slow rate;Small sips/bites Postural Changes and/or Swallow Maneuvers: Seated upright 90 degrees                Oral Care Recommendations: Oral care BID Follow up Recommendations: Skilled Nursing facility;Inpatient Rehab SLP Visit Diagnosis: Dysphagia, oropharyngeal phase (R13.12) Plan: Continue with current plan  of care       [place], Student SLP Office: [phone]  10/10/2019, 10:18 AM

## 2019-10-10 NOTE — Progress Notes (Signed)
LTM EEG hooked up and running - no initial skin breakdown - push button tested - neuro notified.  

## 2019-10-10 NOTE — Progress Notes (Signed)
STROKE TEAM PROGRESS NOTE   INTERVAL HISTORY Patient 2 daughter are at the bedside.  EEG technician at bedside for long-term EEG.  Patient apparently doing much better, awake alert, orientated, speaking fluently and following commands.  No focal deficit.  No fever, denies headache.  Condition more consistent with seizure with prolonged postictal.  Does not consistent with CNS infection.  Will DC antibiotics.  Vitals:   10/10/19 0800 10/10/19 0900 10/10/19 1000 10/10/19 1120  BP: (!) 158/112 (!) 153/61 (!) 155/64 (!) 131/54  Pulse: 72 77 81 69  Resp: 11 13 17 13   Temp: 98.6 F (37 C)     TempSrc: Oral     SpO2: 100% 100% 97% 100%  Weight:      Height:        CBC:  Recent Labs  Lab 10/08/19 0937 10/08/19 0942 10/09/19 0309 10/10/19 0458  WBC 5.1   < > 9.7 8.6  NEUTROABS 2.1  --   --   --   HGB 11.5*   < > 10.7* 9.6*  HCT 33.8*   < > 31.2* 27.8*  MCV 94.4   < > 95.4 92.4  PLT 256   < > 274 256   < > = values in this interval not displayed.    Basic Metabolic Panel:  Recent Labs  Lab 10/09/19 0309 10/09/19 1503 10/09/19 1628 10/10/19 0458  NA 133*  --   --  129*  K 4.6  --   --  4.1  CL 97*  --   --  95*  CO2 17*  --   --  24  GLUCOSE 179*  --   --  189*  BUN 19  --   --  17  CREATININE 1.27*  --   --  1.20*  CALCIUM 9.7  --   --  8.9  MG  --    < > 1.7 1.8  PHOS 3.5   < > 3.2 2.7   < > = values in this interval not displayed.   Lipid Panel:     Component Value Date/Time   CHOL 272 (H) 10/09/2019 0309   TRIG 66 10/09/2019 0309   TRIG 68 10/09/2019 0309   HDL 117 10/09/2019 0309   CHOLHDL 2.3 10/09/2019 0309   VLDL 13 10/09/2019 0309   LDLCALC 142 (H) 10/09/2019 0309   HgbA1c:  Lab Results  Component Value Date   HGBA1C 8.7 (H) 10/09/2019   Urine Drug Screen:     Component Value Date/Time   LABOPIA NONE DETECTED 08/21/2019 1030   COCAINSCRNUR NONE DETECTED 08/21/2019 1030   LABBENZ NONE DETECTED 08/21/2019 1030   AMPHETMU NONE DETECTED 08/21/2019  1030   THCU NONE DETECTED 08/21/2019 1030   LABBARB NONE DETECTED 08/21/2019 1030    Alcohol Level     Component Value Date/Time   ETH <10 08/21/2019 0750    IMAGING past 24 hours EEG adult  Result Date: 10/09/2019 Lora Havens, MD     10/09/2019 12:26 PM Patient Name: [name] [name] MRN: [id number] Epilepsy Attending: Lora Havens Referring Physician/Provider: Dr. Rosalin Hawking Date: 10/09/2019 Duration: 22.22 minutes Patient history: 86 year old female who presented as a code of stroke for altered mental status and aphasia.  MRI brain did not show acute stroke.  EEG evaluate for seizures. Level of alertness: Lethargic AEDs during EEG study: None Technical aspects: This EEG study was done with scalp electrodes positioned according to the 10-20 International system of electrode placement. Electrical activity was  acquired at a sampling rate of 500Hz  and reviewed with a high frequency filter of 70Hz  and a low frequency filter of 1Hz . EEG data were recorded continuously and digitally stored. Description: No clear posterior rhythm was seen.  EEG showed condyle generalized low amplitude 2 to 3 Hz delta slowing.  Intermittent triphasic waves, generalized, maximal bifrontal were also noted.  Hyperventilation and photic stimulation were not performed. Abnormality -Continuous slow, generalized -Triphasic waves, generalized IMPRESSION: This study is suggestive of moderate to severe diffuse encephalopathy, nonspecific etiology could be secondary toxic-metabolic causes.  No seizures or epileptiform discharges were seen throughout the recording. Priyanka O Yadav   VAS [place] LOWER EXTREMITY VENOUS (DVT)  Result Date: 10/09/2019  Lower Venous DVTStudy Indications: Stroke.  Limitations: Patient movement. Comparison Study: no prior Performing Technologist: Abram Sander RVS  Examination Guidelines: A complete evaluation includes B-mode imaging, spectral Doppler, color Doppler, and power Doppler as needed of all  accessible portions of each vessel. Bilateral testing is considered an integral part of a complete examination. Limited examinations for reoccurring indications may be performed as noted. The reflux portion of the exam is performed with the patient in reverse Trendelenburg.  +---------+---------------+---------+-----------+----------+--------------+ RIGHT    CompressibilityPhasicitySpontaneityPropertiesThrombus Aging +---------+---------------+---------+-----------+----------+--------------+ CFV      Full           Yes      Yes                                 +---------+---------------+---------+-----------+----------+--------------+ SFJ      Full                                                        +---------+---------------+---------+-----------+----------+--------------+ FV Prox  Full                                                        +---------+---------------+---------+-----------+----------+--------------+ FV Mid   Full                                                        +---------+---------------+---------+-----------+----------+--------------+ FV DistalFull                                                        +---------+---------------+---------+-----------+----------+--------------+ PFV      Full                                                        +---------+---------------+---------+-----------+----------+--------------+ POP      Full           Yes      Yes                                 +---------+---------------+---------+-----------+----------+--------------+  PTV      Full                                                        +---------+---------------+---------+-----------+----------+--------------+ PERO     Full                                                        +---------+---------------+---------+-----------+----------+--------------+   +---------+---------------+---------+-----------+----------+--------------+  LEFT     CompressibilityPhasicitySpontaneityPropertiesThrombus Aging +---------+---------------+---------+-----------+----------+--------------+ CFV      Full           Yes      Yes                                 +---------+---------------+---------+-----------+----------+--------------+ SFJ      Full                                                        +---------+---------------+---------+-----------+----------+--------------+ FV Prox  Full                                                        +---------+---------------+---------+-----------+----------+--------------+ FV Mid   Full                                                        +---------+---------------+---------+-----------+----------+--------------+ FV DistalFull                                                        +---------+---------------+---------+-----------+----------+--------------+ PFV      Full                                                        +---------+---------------+---------+-----------+----------+--------------+ POP      Full           Yes      Yes                                 +---------+---------------+---------+-----------+----------+--------------+ PTV      Full                                                        +---------+---------------+---------+-----------+----------+--------------+  PERO                                                  Not visualized +---------+---------------+---------+-----------+----------+--------------+     Summary: BILATERAL: - No evidence of deep vein thrombosis seen in the lower extremities, bilaterally.   *See table(s) above for measurements and observations. Electronically signed by Curt Jews MD on 10/09/2019 at 3:35:20 PM.    Final     PHYSICAL EXAM   Temp:  [97.8 F (36.6 C)-99.3 F (37.4 C)] 98.6 F (37 C) (03/23 0800) Pulse Rate:  [59-133] 69 (03/23 1120) Resp:  [11-23] 13 (03/23 1120) BP:  (113-166)/(45-112) 131/54 (03/23 1120) SpO2:  [97 %-100 %] 100 % (03/23 1120)  General - Well nourished, well developed, in no apparent distress.  Slight lethargy.  Ophthalmologic - fundi not visualized due to noncooperation.  Cardiovascular - Regular rhythm and rate.  Mental Status -  Level of arousal and orientation to time, place, and person were intact. Language including expression, naming, repetition, comprehension was assessed and found intact.  But paucity of speech  Cranial Nerves II - XII - II - Visual field intact OU. III, IV, VI - Extraocular movements intact. V - Facial sensation intact bilaterally. VII - Facial movement intact bilaterally. VIII - Hearing & vestibular intact bilaterally. X - Palate elevates symmetrically. XI - Chin turning & shoulder shrug intact bilaterally. XII - Tongue protrusion intact.  Motor Strength - The patient's strength was symmetrical in all extremities and pronator drift was absent.  Bulk was normal and fasciculations were absent.   Motor Tone - Muscle tone was assessed at the neck and appendages and was normal.  Reflexes - The patient's reflexes were symmetrical in all extremities and she had no pathological reflexes.  Sensory - Light touch, temperature/pinprick were assessed and were symmetrical.    Coordination - The patient had normal movements in the hands with no ataxia or dysmetria.  Tremor was absent.  Gait and Station - deferred.   ASSESSMENT/PLAN [name] [name] is a 86 y.o. female with history of HTN, DB, HLD, CKD presenting with altered mental status and global aphasia.  tPA administered 10/08/2019 at 1013.  Likely seizure with prolonged post ictal   Code Stroke CT head No acute abnormality. Small vessel disease. Atrophy. Sinus dz.     CTA head no LVO. L M2/MCA branch w/ mild to moderate origin stenosis. R M2/MCA moderate origin stenosis. R P2/PCA multifocal moderate stenosis. B ICA atherosclerosis w/ R ICA cavernous  and paraclinoid sites mild to moderate stenoses. L ICA mild narrowing.  CTA neck B ICA mild plaque. R VA mild ostial stenosis.  Repeat CT head no acute abnormality. Small vessel disease. Atrophy.   MRI  No acute infarct. Small vessel disease. Atrophy.   2D Echo EF 65-70%  EEG no sz, moderate to severe diffuse encephalopathy  LT EEG pending    LE doppler no DVT  Loop interrogation requested no A. fib detected  LDL 142  HgbA1c 8.7  TSH normal, FT4 1.48(h), thyroid antibodies (1 neg)  1 pending  SCDs for VTE prophylaxis  Will start keppra 500mg  bid  aspirin 81 mg daily and clopidogrel 75 mg daily prior to admission,  on aspirin 81 mg daily. Hold off DAPT in case LP needed  Therapy recommendations:  CIR - rehab consulted  Disposition:  pending   Fever with AMS, resolved  Tmax 100.3->100.7->afebrile  WBC 9.7->8.6  Clinical presentation does not consistent with ClinicalCNS infection  Not able to do LP due to plavix only be off for 2 days  Blood culture no growth < 12h  UA neg  CXR - neg  Ammonia level normal  Empiric antibiotics for CNS infection coverage - stopped   EEG no sz  Hx TIA vs. seizure  08/2019 - TIA vs. Seizure presenting with R arm weakness, with diaphoretic episode and passed out for 2 min. DAPT x 3 weeks. MRI and MRA neg. CUS neg. EF 60-65%. EEG neg. LDL 133 and A1C 8.6. Loop placed.  Hypertension  Home meds:  Losartan 50  Stable  Resume home losartan . Long-term BP goal normotensive  Hyperlipidemia  Home meds:  lipitor 40 and omega 3, resumed in hospital  LDL 142, goal < 70  Continue statin at discharge  Diabetes type II Uncontrolled  Home meds:  levemir 6uam, 4u - resumed in hospital  HgbA1c 8.7, goal < 7.0  CBGs  SSI  Hyperglycemia with overnight episodes of hypoglycemia due to NPO  DB RN following  SSI ac& hs and levemir 6u/4u -> 4u/4u  Resume premeal 3U novolog today  Dysphagia   Cleared for diet - D3  thin  Speech on board  Resumed home meds   Other Stroke Risk Factors  Advanced age  Hx smokeless tobacco use, quit 36 yrs ago   Other Active Problems  Hypothyroidism on synthroid  GERD on PPI  CKD 1.26->1.27->1.20  Hyponatremia 127->133->129  Chronic normocytic anemia 11.5->10.7->9.6  Hospital day # 2  This patient is critically ill due to AMS, fever, focal neuro deficit, dysphagia and at significant risk of neurological worsening, death form status epilepticus, sepsis, meningitis, herpes encephalitis. This patient's care requires constant monitoring of vital signs, hemodynamics, respiratory and cardiac monitoring, review of multiple databases, neurological assessment, discussion with family, other specialists and medical decision making of high complexity. I spent 35 minutes of neurocritical care time in the care of this patient. I had long discussion with daughters at bedside, updated pt current condition, treatment plan and potential prognosis, and answered all the questions. They expressed understanding and appreciation.   Rosalin Hawking, MD PhD Stroke Neurology 10/10/2019 11:33 AM   To contact Stroke Continuity provider, please refer to [email or web address] After hours, contact General Neurology

## 2019-10-10 NOTE — Progress Notes (Signed)
Inpatient Rehabilitation-Admissions Coordinator   Inpatient Rehab Consult received.  I met with pt and her daugther at the bedside as follow up from PM&R consult and to discuss goals and expectations of an inpatient rehab admission. We reviewed program details, expectations, expected LOS, and anticipated assist level at DC. Pt and her daughters are in favor of CIR at this time if insurance approves. I did confirm DC support from her daughters. At this time, I will begin insurance authorization process for possible admit. Will update once there has been a determination.   Raechel Ache, OTR/L  Rehab Admissions Coordinator  [phone] 10/10/2019 3:08 PM

## 2019-10-10 NOTE — Progress Notes (Signed)
SLP Cancellation Note  Patient Details Name: [name] [name] MRN: [id number] DOB: 1934/03/05   Cancelled treatment:       Reason Eval/Treat Not Completed: Patient at procedure or test/unavailable. MD/nursing in room talking with family while pt is also getting hooked up for EEG. Will f/u as able.    Osie Bond., M.A. [facility] Pager [phone] Office [phone]  10/10/2019, 9:08 AM

## 2019-10-10 NOTE — Progress Notes (Signed)
RN notified patient's daughter of patient's current blood sugar prior to giving ordered Levemir and ordered sliding scale insulin before bed. Daughter is concerned about novolog being given before bed to patient on top of the levemir due to patient's "blood sugar sensitivity". RN explained to Daughter exactly what was ordered and what was given. Daughter concerned about patient's blood sugar levels dropping. RN gave report to 3W nurse [name] and Judson Roch. RN suggested they recheck patient's blood sugar throughout the night to prevent hypoglycemia.

## 2019-10-10 NOTE — Progress Notes (Signed)
Inpatient Diabetes Program Recommendations  AACE/ADA: New Consensus Statement on Inpatient Glycemic Control (2015)  Target Ranges:  Prepandial:   less than 140 mg/dL      Peak postprandial:   less than 180 mg/dL (1-2 hours)      Critically ill patients:  140 - 180 mg/dL   Lab Results  Component Value Date   GLUCAP 224 (H) 10/10/2019   HGBA1C 8.7 (H) 10/09/2019    Review of Glycemic Control Results for [name] [name], [name] [name] (MRN [id number]) as of 10/10/2019 11:41  Ref. Range 10/09/2019 23:58 10/10/2019 03:24 10/10/2019 04:22 10/10/2019 07:46 10/10/2019 11:35  Glucose-Capillary Latest Ref Range: 70 - 99 mg/dL 72 58 (L) 138 (H) 168 (H) 224 (H)     Diabetes history: Dm2 Outpatient Diabetes medications: Novolog 10-12 units with breakfast/7-9 units with lunch/ 4-6 units with dinner + Levemir 6 units in the am and 4 units in the pm Current orders for Inpatient glycemic control: Novolog 0-15 tid + levemir 6 units in the am and 4 units in the pm  Inpatient Diabetes Program Recommendations:     Noted to have a cbg of 58mg /dl at 3am and 57mg /dl at 11pm last night. Please consider decreasing am dose of Levemir to 4 units.  May consider adding small meal coverage back as patient starts to eat more as post prandial at noon was 224mg /dl.  Thank you, Reche Dixon, RN, BSN Diabetes Coordinator Inpatient Diabetes Program [phone] (team pager from 8a-5p)

## 2019-10-10 NOTE — Consult Note (Signed)
Physical Medicine and Rehabilitation Consult Reason for Consult: Aphasia with altered mental status Referring Physician: Dr.Xu   HPI: [name] [name] is a 86 y.o. right-handed female with history of diabetes mellitus, hypertension, hyperlipidemia, chronic kidney disease with creatinine 1.27-1.63.  Per chart review patient lives alone independent prior to admission 1 level home 3 steps to entry.  Presented 10/08/2019 with altered mental status and aphasia.  CT/MRI of the brain negative for acute changes.  Patient did receive TPA.  CT angiogram of head and neck no intracranial large vessel occlusion.  Follow-up MRI 10/09/2019 again negative.  Echocardiogram with ejection fraction of [id number] grade 1 diastolic dysfunction.  EEG negative for seizure.  Lower extremity Dopplers negative.  Neurology consulted with work-up diagnosis possible CNS infection versus prolonged post ictal with follow-up EEG pending.  Patient currently maintained on Omnipen as well as Rocephin/vancomycin.  Blood cultures currently no growth, latest creatinine 1.27.  Dysphagia #1 thin liquid diet.  Therapy evaluations completed with recommendations of physical medicine rehab consult.   Review of Systems  Constitutional: Negative for chills and fever.  HENT: Negative for hearing loss.   Eyes: Negative for blurred vision and double vision.  Respiratory: Negative for cough and shortness of breath.   Cardiovascular: Negative for chest pain, palpitations and leg swelling.  Gastrointestinal: Positive for constipation. Negative for heartburn, nausea and vomiting.       GERD  Genitourinary: Negative for dysuria, flank pain and hematuria.  Musculoskeletal: Positive for joint pain and myalgias.  Skin: Negative for rash.  Neurological: Positive for speech change and weakness.  All other systems reviewed and are negative.  Past Medical History:  Diagnosis Date  . Arthritis    "knees, legs" (06/10/2016)  . Ascites   . Chronic  kidney disease    "related to my diabetes"  . Chronic lower back pain   . Diabetes mellitus without complication ([place])   . Eczema   . GERD (gastroesophageal reflux disease)   . High cholesterol   . Hyperlipidemia   . Hypertension   . Hypothyroidism   . OAB (overactive bladder)   . Osteoporosis   . Thyroid disease   . Type II diabetes mellitus ([place])   . Urticaria    Past Surgical History:  Procedure Laterality Date  . ABDOMINAL HYSTERECTOMY    . KNEE ARTHROSCOPY    . LAPAROTOMY N/A 12/03/2016   Procedure: EXPLORATORY LAPAROTOMY, LYSIS OF ADHESIONS;  Surgeon: Armandina Gemma, MD;  Location: [facility];  Service: General;  Laterality: N/A;  . LOOP RECORDER INSERTION N/A 08/23/2019   Procedure: LOOP RECORDER INSERTION;  Surgeon: Evans Lance, MD;  Location: [place] [facility];  Service: Cardiovascular;  Laterality: N/A;  . vocal cord polyps     Family History  Problem Relation Age of Onset  . Diabetes Mother   . Hypertension Mother   . Diabetes Sister   . Diabetes Brother   . Asthma Father   . Allergic rhinitis Neg Hx   . Eczema Neg Hx   . Urticaria Neg Hx    Social History:  reports that she has never smoked. She quit smokeless tobacco use about 36 years ago.  Her smokeless tobacco use included chew. She reports that she does not drink alcohol or use drugs. Allergies:  Allergies  Allergen Reactions  . Sulfonamide Derivatives Hives  . Bactrim [Sulfamethoxazole-Trimethoprim] Rash    Reported by [facility]  . Lisinopril Cough    Reported by [facility]  Medications Prior to Admission  Medication Sig Dispense Refill  . acetaminophen (TYLENOL) 500 MG tablet Take 500-1,000 mg by mouth at bedtime as needed for mild pain.     Marland Kitchen aspirin EC 81 MG tablet Take 81 mg by mouth daily.    Marland Kitchen atorvastatin (LIPITOR) 40 MG tablet Take 40 mg by mouth daily at 6 PM.     . calcium-vitamin D 250-100 MG-UNIT tablet Take 1 tablet by mouth daily.     . cetirizine (ZYRTEC) 10 MG tablet  Take 10 mg by mouth daily as needed for allergies.    . cholecalciferol (VITAMIN D) 1000 units tablet Take 1,000 Units by mouth daily.    . clopidogrel (PLAVIX) 75 MG tablet Take 75 mg by mouth daily.    Marland Kitchen FLUAD 0.5 ML SUSY ADM 0.5ML IM UTD  0  . insulin aspart (NOVOLOG) 100 UNIT/ML injection Inject 4-12 Units into the skin See admin instructions. Inject 10-12 units at breakfast, 7-9 units at lunch and 4-6 units at dinner.    . Insulin Detemir (LEVEMIR FLEXTOUCH) 100 UNIT/ML Pen Inject 4-6 Units into the skin See admin instructions. Inject 6 units in the morning and 4 units at bedtime.    . Iron-FA-B Cmp-C-Biot-Probiotic (FUSION PLUS) CAPS Take 1 capsule by mouth daily.     Marland Kitchen losartan (COZAAR) 50 MG tablet Take 50 mg by mouth daily.     . Multiple Vitamin (MULTIVITAMIN WITH MINERALS) TABS tablet Take 1 tablet by mouth daily.    Marland Kitchen nystatin (MYCOSTATIN) 100000 UNIT/ML suspension Take 4 mLs by mouth at bedtime as needed.    . Omega-3 Fatty Acids (FISH OIL) 1000 MG CAPS Take 1,000 mg by mouth 2 (two) times daily.    . pantoprazole (PROTONIX) 40 MG tablet Take 40 mg by mouth daily.     . polyethylene glycol (MIRALAX / GLYCOLAX) 17 g packet Take 17 g by mouth daily as needed for mild constipation.    Marland Kitchen SYNTHROID 137 MCG tablet Take 137 mcg by mouth every morning.    . B-D UF III MINI PEN NEEDLES 31G X 5 MM MISC USE AS DIRECTED TID      Home: Home Living Family/patient expects to be discharged to:: Private residence Living Arrangements: Alone Available Help at Discharge: Family Type of Home: House Home Access: Stairs to enter Technical brewer of Steps: 3 Entrance Stairs-Rails: None Home Layout: One level Bathroom Shower/Tub: Chiropodist: Standard Home Equipment: Environmental consultant - 2 wheels, Shower seat Additional Comments: [name] reporting that her sister was staying with pt recently as she works from home  Lives With: Alone  Functional History: Prior Function Level  of Independence: Independent Comments: Daughter reports that pt was independent with ADLs and IADLs including driving. Also reporting that they assisted with medication management. Pt enjoys working in her yard. Functional Status:  Mobility: Bed Mobility Overal bed mobility: Needs Assistance Bed Mobility: Supine to Sit, Sit to Supine Supine to sit: Min assist, HOB elevated, +2 for physical assistance Sit to supine: Min assist, +2 for physical assistance General bed mobility comments: increased time for initiation, but mobilized well once able to complete automatic task Transfers Overall transfer level: Needs assistance Equipment used: 2 person hand held assist Transfers: Sit to/from Stand Sit to Stand: Min assist, +2 safety/equipment General transfer comment: assist to inititate anterior weight shift, then pt able to rise with +2 A for safety and for balance      ADL: ADL Overall ADL's : Needs assistance/impaired  Eating/Feeding: Maximal assistance, Sitting Eating/Feeding Details (indicate cue type and reason): Pt engaging in self feeding task to eat pudding. Poor bialteral coorindation and requiring Max A to scoop pudding and then bring to mouth (spoon in dominant right hand and pudding cup in left hand). Placing spoon in left hand, pt then able to scoop pudding with Mod hand over hand and then guide left hadn to mouth.  Grooming: Maximal assistance, Sitting, Wash/dry face Grooming Details (indicate cue type and reason): Max hand over hand to wipe mouth. Poor following of cues Upper Body Bathing: Maximal assistance, Sitting Lower Body Bathing: Total assistance, Sit to/from stand Upper Body Dressing : Maximal assistance, Sitting Lower Body Dressing: Total assistance, Sit to/from stand Toilet Transfer: Moderate assistance, +2 for physical assistance(sit<>stand at EOB) Toilet Transfer Details (indicate cue type and reason): Mod A +2 for maintain standing at EOB Functional mobility during  ADLs: Minimal assistance, +2 for physical assistance, Moderate assistance(sit<>stand at EOB; Mod A +2 in standing) General ADL Comments: Max-Total A for ADLs due to decreased cognition, following of commands, attention to right, balance, and activity tolerance.   Cognition: Cognition Overall Cognitive Status: Impaired/Different from baseline Orientation Level: Oriented to person Cognition Arousal/Alertness: Lethargic Behavior During Therapy: Flat affect Overall Cognitive Status: Impaired/Different from baseline Area of Impairment: Attention, Following commands, Memory, Safety/judgement Current Attention Level: Sustained Memory: Decreased short-term memory Following Commands: Follows one step commands inconsistently, Follows one step commands with increased time Safety/Judgement: Decreased awareness of safety, Decreased awareness of deficits General Comments: Pt stating yes/no to 50% of questions; unsure of consistently and reliability. Pt not following simple verbal cues. Engaging pt in self feeding task. Required Max tactile and verbal cues for sequencing of self feeding task. Inattention to right with little movement at RUE and head turn to left  Blood pressure (!) 124/54, pulse 66, temperature 97.8 F (36.6 C), temperature source Axillary, resp. rate 11, height 5\' 5"  (1.651 m), weight 62.8 kg, SpO2 99 %.   Physical Exam  General: Alert and oriented x 2, No apparent distress HEENT: Head is normocephalic, atraumatic, sclera anicteric, oral mucosa pink and moist, dentition intact, ext ear canals clear, hard of hearing.  Neck: Supple without JVD or lymphadenopathy Heart: Reg rate and rhythm. No murmurs rubs or gallops Chest: CTA bilaterally without wheezes, rales, or rhonchi; no distress Abdomen: Soft, non-tender, non-distended, bowel sounds positive. Extremities: No clubbing, cyanosis, or edema. Pulses are 2+ Skin: Clean and intact without signs of breakdown Neuro: Patient is alert  makes eye contact with examiner.  She does follow simple commands although inconsistent.  She states she was in the hospital but cannot provide name of facility.  States year is 2021 but cannot state month or date. She does have a component of aphasia. Left gaze preference. Musculoskeletal: 4/5 strength throughout.  Psych: Pt's affect is appropriate. Pt is cooperative. Very pleasant.    Results for orders placed or performed during the hospital encounter of 10/08/19 (from the past 24 hour(s))  Glucose, capillary     Status: Abnormal   Collection Time: 10/09/19  7:40 AM  Result Value Ref Range   Glucose-Capillary 307 (H) 70 - 99 mg/dL  Glucose, capillary     Status: Abnormal   Collection Time: 10/09/19 11:33 AM  Result Value Ref Range   Glucose-Capillary 399 (H) 70 - 99 mg/dL  Glucose, capillary     Status: Abnormal   Collection Time: 10/09/19  1:36 PM  Result Value Ref Range   Glucose-Capillary 276 (H)  70 - 99 mg/dL  Culture, blood (Routine X 2) w Reflex to ID Panel     Status: None (Preliminary result)   Collection Time: 10/09/19  3:01 PM   Specimen: BLOOD LEFT HAND  Result Value Ref Range   Specimen Description BLOOD LEFT HAND    Special Requests      BOTTLES DRAWN AEROBIC ONLY Blood Culture results may not be optimal due to an inadequate volume of blood received in culture bottles   Culture NO GROWTH < 12 HOURS    Report Status PENDING   Platelet inhibition p2y12 (Not at [facility])     Status: Abnormal   Collection Time: 10/09/19  3:03 PM  Result Value Ref Range   Platelet Function  P2Y12 119 (L) 182 - 335 PRU  Culture, blood (Routine X 2) w Reflex to ID Panel     Status: None (Preliminary result)   Collection Time: 10/09/19  3:03 PM   Specimen: BLOOD LEFT HAND  Result Value Ref Range   Specimen Description BLOOD LEFT HAND    Special Requests      BOTTLES DRAWN AEROBIC ONLY Blood Culture adequate volume   Culture NO GROWTH < 12 HOURS    Report Status PENDING   T4, free      Status: Abnormal   Collection Time: 10/09/19  3:03 PM  Result Value Ref Range   Free T4 1.48 (H) 0.61 - 1.12 ng/dL  Ammonia     Status: None   Collection Time: 10/09/19  3:03 PM  Result Value Ref Range   Ammonia 24 9 - 35 umol/L  Magnesium     Status: None   Collection Time: 10/09/19  3:03 PM  Result Value Ref Range   Magnesium 1.7 1.7 - 2.4 mg/dL  Phosphorus     Status: None   Collection Time: 10/09/19  3:03 PM  Result Value Ref Range   Phosphorus 3.2 2.5 - 4.6 mg/dL  TSH     Status: None   Collection Time: 10/09/19  3:03 PM  Result Value Ref Range   TSH 1.303 0.350 - 4.500 uIU/mL  Magnesium     Status: None   Collection Time: 10/09/19  4:28 PM  Result Value Ref Range   Magnesium 1.7 1.7 - 2.4 mg/dL  Phosphorus     Status: None   Collection Time: 10/09/19  4:28 PM  Result Value Ref Range   Phosphorus 3.2 2.5 - 4.6 mg/dL  Glucose, capillary     Status: Abnormal   Collection Time: 10/09/19  7:32 PM  Result Value Ref Range   Glucose-Capillary 245 (H) 70 - 99 mg/dL  Urinalysis, Complete w Microscopic     Status: Abnormal   Collection Time: 10/09/19  9:00 PM  Result Value Ref Range   Color, Urine YELLOW YELLOW   APPearance CLEAR CLEAR   Specific Gravity, Urine 1.016 1.005 - 1.030   pH 5.0 5.0 - 8.0   Glucose, UA >=500 (A) NEGATIVE mg/dL   Hgb urine dipstick NEGATIVE NEGATIVE   Bilirubin Urine NEGATIVE NEGATIVE   Ketones, ur 5 (A) NEGATIVE mg/dL   Protein, ur 100 (A) NEGATIVE mg/dL   Nitrite NEGATIVE NEGATIVE   Leukocytes,Ua NEGATIVE NEGATIVE   RBC / HPF 0-5 0 - 5 RBC/hpf   WBC, UA 0-5 0 - 5 WBC/hpf   Bacteria, UA RARE (A) NONE SEEN   Squamous Epithelial / LPF 0-5 0 - 5  Glucose, capillary     Status: Abnormal   Collection Time: 10/09/19 11:22  PM  Result Value Ref Range   Glucose-Capillary 57 (L) 70 - 99 mg/dL  Glucose, capillary     Status: None   Collection Time: 10/09/19 11:58 PM  Result Value Ref Range   Glucose-Capillary 72 70 - 99 mg/dL  Glucose, capillary      Status: Abnormal   Collection Time: 10/10/19  3:24 AM  Result Value Ref Range   Glucose-Capillary 58 (L) 70 - 99 mg/dL  Glucose, capillary     Status: Abnormal   Collection Time: 10/10/19  4:22 AM  Result Value Ref Range   Glucose-Capillary 138 (H) 70 - 99 mg/dL  CBC     Status: Abnormal   Collection Time: 10/10/19  4:58 AM  Result Value Ref Range   WBC 8.6 4.0 - 10.5 K/uL   RBC 3.01 (L) 3.87 - 5.11 MIL/uL   Hemoglobin 9.6 (L) 12.0 - 15.0 g/dL   HCT 27.8 (L) 36.0 - 46.0 %   MCV 92.4 80.0 - 100.0 fL   MCH 31.9 26.0 - 34.0 pg   MCHC 34.5 30.0 - 36.0 g/dL   RDW 13.2 11.5 - 15.5 %   Platelets 256 150 - 400 K/uL   nRBC 0.0 0.0 - 0.2 %   CT Code Stroke CTA Head W/WO contrast  Result Date: 10/08/2019 CLINICAL DATA:  Aphasia. EXAM: CT ANGIOGRAPHY HEAD AND NECK TECHNIQUE: Multidetector CT imaging of the head and neck was performed using the standard protocol during bolus administration of intravenous contrast. Multiplanar CT image reconstructions and MIPs were obtained to evaluate the vascular anatomy. Carotid stenosis measurements (when applicable) are obtained utilizing NASCET criteria, using the distal internal carotid diameter as the denominator. CONTRAST:  Administered contrast not known at this time COMPARISON:  Noncontrast head CT 10/08/2019, MRI/MRA head 08/21/2019. FINDINGS: CTA NECK FINDINGS Aortic arch: Standard aortic branching. Calcified atherosclerotic plaque within the visualized aortic arch and proximal major branch vessels of the neck. No significant innominate or proximal subclavian artery stenosis. Right carotid system: See seen ICA patent within the neck without stenosis. Minimal calcified plaque within the carotid bifurcation. Left carotid system: CCA and ICA patent within the neck without stenosis. Mild calcified plaque within the carotid bifurcation and proximal ICA. Vertebral arteries: The vertebral arteries are codominant and patent within the neck bilaterally. Calcified  plaque at the origin the right vertebral artery with resultant mild ostial stenosis. Skeleton: Cervical spondylosis. Most notably there is advanced disc height loss with prominent degenerative endplate irregularity and degenerative endplate sclerosis at D34-534 and T1-T2. Other neck: No neck mass or cervical lymphadenopathy. Upper chest: No consolidation within the imaged lung apices. Review of the MIP images confirms the above findings CTA HEAD FINDINGS Anterior circulation: The intracranial internal carotid arteries are patent bilaterally with scattered calcified plaque. Sites of mild to moderate stenosis within the cavernous and paraclinoid right ICA. No more than mild narrowing of the left ICA. The M1 middle cerebral arteries are patent without significant stenosis. Mild-to-moderate stenosis at the origin of a right M2 MCA branch (series 7, image. 99). Moderate stenosis at the origin of a right M2 MCA branch vessel (series 7, image 101). The anterior cerebral arteries are patent without high-grade proximal stenosis. No intracranial aneurysm is identified. Posterior circulation: The intracranial vertebral arteries are patent without significant stenosis, as is the basilar artery. The posterior cerebral arteries are patent bilaterally. Multifocal sites of moderate stenosis within the P2 right posterior cerebral artery. Posterior communicating arteries are poorly delineated and may be hypoplastic or absent bilaterally.  Venous sinuses: Within limitations of contrast timing, no convincing thrombus. Anatomic variants: As described Review of the MIP images confirms the above findings No emergent large vessel occlusion identified. These results were called by telephone at the time of interpretation on 10/08/2019 at 10:30 am to provider [facility] , who verbally acknowledged these results. IMPRESSION: CTA neck: 1. The bilateral common and internal carotid arteries are patent within the neck without significant  stenosis. Mild plaque in the bilateral carotid systems as described. 2. The vertebral arteries are patent within the neck bilaterally. Calcified plaque results in mild ostial stenosis on the right. CTA head: 1. No intracranial large vessel occlusion. 2. Mild-to-moderate stenosis at the origin of a left M2 MCA branch vessel. 3. Moderate stenosis at the origin of a right M2 MCA branch vessel. 4. Multifocal moderate stenoses within the P2 right posterior cerebral artery. 5. Atherosclerotic calcifications within the intracranial internal carotid arteries. Sites of mild to moderate stenosis within the cavernous and paraclinoid right ICA. No more than mild narrowing of the left ICA. Electronically Signed   By: Kellie Simmering DO   On: 10/08/2019 10:41   CT HEAD WO CONTRAST  Result Date: 10/08/2019 CLINICAL DATA:  Post tPA stroke, follow-up. EXAM: CT HEAD WITHOUT CONTRAST TECHNIQUE: Contiguous axial images were obtained from the base of the skull through the vertex without intravenous contrast. COMPARISON:  Head CT 10/08/2019 FINDINGS: Brain: There is residual circulating contrast material. There is no evidence of acute intracranial hemorrhage, intracranial mass, midline shift or extra-axial fluid collection.No demarcated cortical infarction. Stable generalized parenchymal atrophy and chronic small vessel ischemic disease. Vascular: Circulating contrast material precludes evaluation for hyperdense vessels. Skull: Normal. Negative for fracture or focal lesion. Sinuses/Orbits: Visualized orbits demonstrate no acute abnormality. No significant paranasal sinus disease or mastoid effusion at the imaged levels. IMPRESSION: No evidence of acute intracranial abnormality. Stable generalized parenchymal atrophy and chronic small vessel ischemic disease. Electronically Signed   By: Kellie Simmering DO   On: 10/08/2019 14:32   CT Code Stroke CTA Neck W/WO contrast  Result Date: 10/08/2019 CLINICAL DATA:  Aphasia. EXAM: CT ANGIOGRAPHY  HEAD AND NECK TECHNIQUE: Multidetector CT imaging of the head and neck was performed using the standard protocol during bolus administration of intravenous contrast. Multiplanar CT image reconstructions and MIPs were obtained to evaluate the vascular anatomy. Carotid stenosis measurements (when applicable) are obtained utilizing NASCET criteria, using the distal internal carotid diameter as the denominator. CONTRAST:  Administered contrast not known at this time COMPARISON:  Noncontrast head CT 10/08/2019, MRI/MRA head 08/21/2019. FINDINGS: CTA NECK FINDINGS Aortic arch: Standard aortic branching. Calcified atherosclerotic plaque within the visualized aortic arch and proximal major branch vessels of the neck. No significant innominate or proximal subclavian artery stenosis. Right carotid system: See seen ICA patent within the neck without stenosis. Minimal calcified plaque within the carotid bifurcation. Left carotid system: CCA and ICA patent within the neck without stenosis. Mild calcified plaque within the carotid bifurcation and proximal ICA. Vertebral arteries: The vertebral arteries are codominant and patent within the neck bilaterally. Calcified plaque at the origin the right vertebral artery with resultant mild ostial stenosis. Skeleton: Cervical spondylosis. Most notably there is advanced disc height loss with prominent degenerative endplate irregularity and degenerative endplate sclerosis at D34-534 and T1-T2. Other neck: No neck mass or cervical lymphadenopathy. Upper chest: No consolidation within the imaged lung apices. Review of the MIP images confirms the above findings CTA HEAD FINDINGS Anterior circulation: The intracranial internal carotid arteries are  patent bilaterally with scattered calcified plaque. Sites of mild to moderate stenosis within the cavernous and paraclinoid right ICA. No more than mild narrowing of the left ICA. The M1 middle cerebral arteries are patent without significant stenosis.  Mild-to-moderate stenosis at the origin of a right M2 MCA branch (series 7, image. 99). Moderate stenosis at the origin of a right M2 MCA branch vessel (series 7, image 101). The anterior cerebral arteries are patent without high-grade proximal stenosis. No intracranial aneurysm is identified. Posterior circulation: The intracranial vertebral arteries are patent without significant stenosis, as is the basilar artery. The posterior cerebral arteries are patent bilaterally. Multifocal sites of moderate stenosis within the P2 right posterior cerebral artery. Posterior communicating arteries are poorly delineated and may be hypoplastic or absent bilaterally. Venous sinuses: Within limitations of contrast timing, no convincing thrombus. Anatomic variants: As described Review of the MIP images confirms the above findings No emergent large vessel occlusion identified. These results were called by telephone at the time of interpretation on 10/08/2019 at 10:30 am to provider [facility] , who verbally acknowledged these results. IMPRESSION: CTA neck: 1. The bilateral common and internal carotid arteries are patent within the neck without significant stenosis. Mild plaque in the bilateral carotid systems as described. 2. The vertebral arteries are patent within the neck bilaterally. Calcified plaque results in mild ostial stenosis on the right. CTA head: 1. No intracranial large vessel occlusion. 2. Mild-to-moderate stenosis at the origin of a left M2 MCA branch vessel. 3. Moderate stenosis at the origin of a right M2 MCA branch vessel. 4. Multifocal moderate stenoses within the P2 right posterior cerebral artery. 5. Atherosclerotic calcifications within the intracranial internal carotid arteries. Sites of mild to moderate stenosis within the cavernous and paraclinoid right ICA. No more than mild narrowing of the left ICA. Electronically Signed   By: Kellie Simmering DO   On: 10/08/2019 10:41   MR BRAIN WO  CONTRAST  Result Date: 10/09/2019 CLINICAL DATA:  Stroke symptoms.  Aphasia.  23 hours post tPA. EXAM: MRI HEAD WITHOUT CONTRAST TECHNIQUE: Multiplanar, multiecho pulse sequences of the brain and surrounding structures were obtained without intravenous contrast. COMPARISON:  CT head 10/08/2019 FINDINGS: Brain: Negative for acute infarct. Generalized atrophy. Mild chronic white matter changes bilaterally. Brainstem intact. Small chronic infarct left cerebellum. Negative for hemorrhage or mass. Vascular: Normal arterial flow voids. Skull and upper cervical spine: No focal skeletal lesion identified. Sinuses/Orbits: Mild mucosal edema paranasal sinuses. Bilateral cataract extraction. Other: None IMPRESSION: Negative for acute infarct. Atrophy with mild chronic microvascular ischemic change in the white matter. Electronically Signed   By: Franchot Gallo M.D.   On: 10/09/2019 10:20   DG Chest Portable 1 View  Result Date: 10/08/2019 CLINICAL DATA:  Acute mental status change EXAM: PORTABLE CHEST 1 VIEW COMPARISON:  August 17, 2019 FINDINGS: The heart size and mediastinal contours are within normal limits. Both lungs are clear. The visualized skeletal structures are unremarkable. IMPRESSION: No active disease. Electronically Signed   By: Dorise Bullion III M.D   On: 10/08/2019 10:42   EEG adult  Result Date: 10/09/2019 Lora Havens, MD     10/09/2019 12:26 PM Patient Name: TSUYUKO MADURA MRN: [id number] Epilepsy Attending: Lora Havens Referring Physician/Provider: Dr. Rosalin Hawking Date: 10/09/2019 Duration: 22.22 minutes Patient history: 86 year old female who presented as a code of stroke for altered mental status and aphasia.  MRI brain did not show acute stroke.  EEG evaluate for seizures. Level of alertness: Lethargic  AEDs during EEG study: None Technical aspects: This EEG study was done with scalp electrodes positioned according to the 10-20 International system of electrode placement. Electrical  activity was acquired at a sampling rate of 500Hz  and reviewed with a high frequency filter of 70Hz  and a low frequency filter of 1Hz . EEG data were recorded continuously and digitally stored. Description: No clear posterior rhythm was seen.  EEG showed condyle generalized low amplitude 2 to 3 Hz delta slowing.  Intermittent triphasic waves, generalized, maximal bifrontal were also noted.  Hyperventilation and photic stimulation were not performed. Abnormality -Continuous slow, generalized -Triphasic waves, generalized IMPRESSION: This study is suggestive of moderate to severe diffuse encephalopathy, nonspecific etiology could be secondary toxic-metabolic causes.  No seizures or epileptiform discharges were seen throughout the recording. Lora Havens   ECHOCARDIOGRAM COMPLETE  Result Date: 10/09/2019    ECHOCARDIOGRAM REPORT   Patient Name:   KIONNA TRUXAL Date of Exam: 10/09/2019 Medical Rec #:  [id number]       Height:       65.0 in Accession #:    [id number]      Weight:       138.4 lb Date of Birth:  1934-03-20       BSA:          1.692 m Patient Age:    64 years        BP:           115/77 mmHg Patient Gender: F               HR:           88 bpm. Exam Location:  Inpatient Procedure: 2D Echo, Cardiac Doppler and Color Doppler Indications:    Stroke 434.91 / I163.9  History:        Patient has prior history of Echocardiogram examinations, most                 recent 08/22/2019. Risk Factors:Hypertension, Diabetes,                 Dyslipidemia and GERD. CKD. Hypothyroidism.  Sonographer:    Jonelle Sidle Dance Referring Phys: [id number] [place]  1. Left ventricular ejection fraction, by estimation, is 65 to 70%. The left ventricle has normal function. The left ventricle has no regional wall motion abnormalities. Left ventricular diastolic parameters are consistent with Grade I diastolic dysfunction (impaired relaxation).  2. Right ventricular systolic function is normal. The right ventricular  size is normal. There is mildly elevated pulmonary artery systolic pressure.  3. The mitral valve is abnormal. Mild mitral valve regurgitation.  4. The aortic valve is tricuspid. Aortic valve regurgitation is trivial. Mild aortic valve sclerosis is present, with no evidence of aortic valve stenosis. Comparison(s): Changes from prior study are noted. 08/22/19: LVEF 60-65%. FINDINGS  Left Ventricle: Left ventricular ejection fraction, by estimation, is 65 to 70%. The left ventricle has normal function. The left ventricle has no regional wall motion abnormalities. The left ventricular internal cavity size was normal in size. There is  no left ventricular hypertrophy. Left ventricular diastolic parameters are consistent with Grade I diastolic dysfunction (impaired relaxation). Indeterminate filling pressures. Right Ventricle: The right ventricular size is normal. No increase in right ventricular wall thickness. Right ventricular systolic function is normal. There is mildly elevated pulmonary artery systolic pressure. The tricuspid regurgitant velocity is 3.03  m/s, and with an assumed right atrial pressure of 3 mmHg, the estimated right ventricular systolic pressure  is 39.7 mmHg. Left Atrium: Left atrial size was normal in size. Right Atrium: Right atrial size was normal in size. Pericardium: There is no evidence of pericardial effusion. Mitral Valve: The mitral valve is abnormal. There is mild thickening of the mitral valve leaflet(s). Mild mitral valve regurgitation. Tricuspid Valve: The tricuspid valve is grossly normal. Tricuspid valve regurgitation is trivial. Aortic Valve: The aortic valve is tricuspid. Aortic valve regurgitation is trivial. Mild aortic valve sclerosis is present, with no evidence of aortic valve stenosis. Pulmonic Valve: The pulmonic valve was grossly normal. Pulmonic valve regurgitation is trivial. Aorta: The aortic root and ascending aorta are structurally normal, with no evidence of dilitation.  IAS/Shunts: No atrial level shunt detected by color flow Doppler.  LEFT VENTRICLE PLAX 2D LVIDd:         3.78 cm  Diastology LVIDs:         2.45 cm  LV e' lateral:   8.24 cm/s LV PW:         0.84 cm  LV E/e' lateral: 12.1 LV IVS:        0.82 cm  LV e' medial:    7.15 cm/s LVOT diam:     1.90 cm  LV E/e' medial:  13.9 LV SV:         72 LV SV Index:   43 LVOT Area:     2.84 cm  RIGHT VENTRICLE             IVC RV Basal diam:  3.17 cm     IVC diam: 1.75 cm RV Mid diam:    2.12 cm RV S prime:     16.80 cm/s TAPSE (M-mode): 2.5 cm LEFT ATRIUM             Index       RIGHT ATRIUM           Index LA diam:        2.90 cm 1.71 cm/m  RA Area:     13.00 cm LA Vol (A2C):   47.2 ml 27.90 ml/m RA Volume:   31.90 ml  18.85 ml/m LA Vol (A4C):   37.3 ml 22.05 ml/m LA Biplane Vol: 42.0 ml 24.82 ml/m  AORTIC VALVE LVOT Vmax:   122.00 cm/s LVOT Vmean:  72.900 cm/s LVOT VTI:    0.255 m  AORTA Ao Root diam: 3.10 cm Ao Asc diam:  2.60 cm MITRAL VALVE                 TRICUSPID VALVE MV Area (PHT): 3.31 cm      TR Peak grad:   36.7 mmHg MV Decel Time: 229 msec      TR Vmax:        303.00 cm/s MR Peak grad:    193.8 mmHg MR Mean grad:    126.0 mmHg  SHUNTS MR Vmax:         696.00 cm/s Systemic VTI:  0.26 m MR Vmean:        528.0 cm/s  Systemic Diam: 1.90 cm MR PISA:         0.57 cm MR PISA Eff ROA: 3 mm MR PISA Radius:  0.30 cm MV E velocity: 99.40 cm/s MV A velocity: 117.00 cm/s MV E/A ratio:  0.85 Lyman Bishop MD Electronically signed by Lyman Bishop MD Signature Date/Time: 10/09/2019/11:03:32 AM    Final    CT HEAD CODE STROKE WO CONTRAST  Result Date: 10/08/2019 CLINICAL DATA:  Code stroke. Focal neuro deficit,  greater than 6 hours, stroke suspected. Additional history provided: Aphasia. EXAM: CT HEAD WITHOUT CONTRAST TECHNIQUE: Contiguous axial images were obtained from the base of the skull through the vertex without intravenous contrast. COMPARISON:  MRI/MRA head 09/10/2019, head CT 08/21/2019 FINDINGS: Brain: The  examination is mildly motion degraded. There is no evidence of acute intracranial hemorrhage, intracranial mass, midline shift or extra-axial fluid collection.No demarcated cortical infarction. Ill-defined hypoattenuation within the cerebral white matter is nonspecific, but consistent with chronic small vessel ischemic disease. Stable, mild generalized parenchymal atrophy. Vascular: No hyperdense vessel.  Atherosclerotic calcifications. Skull: Normal. Negative for fracture or focal lesion. Sinuses/Orbits: Visualized orbits demonstrate no acute abnormality. Mild ethmoid sinus mucosal thickening. Trace fluid within left mastoid air cells. IMPRESSION: Mildly motion degraded exam. No evidence of acute intracranial abnormality. Generalized parenchymal atrophy and chronic small vessel ischemic disease. Mild ethmoid sinus mucosal thickening. Trace fluid within left mastoid air cells. Electronically Signed   By: Kellie Simmering DO   On: 10/08/2019 10:00   VAS [place] LOWER EXTREMITY VENOUS (DVT)  Result Date: 10/09/2019  Lower Venous DVTStudy Indications: Stroke.  Limitations: Patient movement. Comparison Study: no prior Performing Technologist: Abram Sander RVS  Examination Guidelines: A complete evaluation includes B-mode imaging, spectral Doppler, color Doppler, and power Doppler as needed of all accessible portions of each vessel. Bilateral testing is considered an integral part of a complete examination. Limited examinations for reoccurring indications may be performed as noted. The reflux portion of the exam is performed with the patient in reverse Trendelenburg.  +---------+---------------+---------+-----------+----------+--------------+ RIGHT    CompressibilityPhasicitySpontaneityPropertiesThrombus Aging +---------+---------------+---------+-----------+----------+--------------+ CFV      Full           Yes      Yes                                  +---------+---------------+---------+-----------+----------+--------------+ SFJ      Full                                                        +---------+---------------+---------+-----------+----------+--------------+ FV Prox  Full                                                        +---------+---------------+---------+-----------+----------+--------------+ FV Mid   Full                                                        +---------+---------------+---------+-----------+----------+--------------+ FV DistalFull                                                        +---------+---------------+---------+-----------+----------+--------------+ PFV      Full                                                        +---------+---------------+---------+-----------+----------+--------------+  POP      Full           Yes      Yes                                 +---------+---------------+---------+-----------+----------+--------------+ PTV      Full                                                        +---------+---------------+---------+-----------+----------+--------------+ PERO     Full                                                        +---------+---------------+---------+-----------+----------+--------------+   +---------+---------------+---------+-----------+----------+--------------+ LEFT     CompressibilityPhasicitySpontaneityPropertiesThrombus Aging +---------+---------------+---------+-----------+----------+--------------+ CFV      Full           Yes      Yes                                 +---------+---------------+---------+-----------+----------+--------------+ SFJ      Full                                                        +---------+---------------+---------+-----------+----------+--------------+ FV Prox  Full                                                         +---------+---------------+---------+-----------+----------+--------------+ FV Mid   Full                                                        +---------+---------------+---------+-----------+----------+--------------+ FV DistalFull                                                        +---------+---------------+---------+-----------+----------+--------------+ PFV      Full                                                        +---------+---------------+---------+-----------+----------+--------------+ POP      Full           Yes      Yes                                 +---------+---------------+---------+-----------+----------+--------------+  PTV      Full                                                        +---------+---------------+---------+-----------+----------+--------------+ PERO                                                  Not visualized +---------+---------------+---------+-----------+----------+--------------+     Summary: BILATERAL: - No evidence of deep vein thrombosis seen in the lower extremities, bilaterally.   *See table(s) above for measurements and observations. Electronically signed by Curt Jews MD on 10/09/2019 at 3:35:20 PM.    Final      Assessment/Plan: Diagnosis: AMS 2/2 CNS infection vs. Prolonged post-ictal phase vs paraneoplastic encephalitis 1. Does the need for close, 24 hr/day medical supervision in concert with the patient's rehab needs make it unreasonable for this patient to be served in a less intensive setting? Yes 2. 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 Due to bladder management, bowel management, safety, skin/wound care, disease management, medication administration, pain management and patient education, does the patient require 24 hr/day rehab nursing? Yes 4. Does the patient  require coordinated care of a physician, rehab nurse, therapy disciplines of PT, OT, SLP to address physical and functional deficits in the context of the above medical diagnosis(es)? Yes Addressing deficits in the following areas: balance, endurance, locomotion, strength, transferring, bowel/bladder control, bathing, dressing, feeding, grooming, toileting, cognition, speech, language, swallowing and psychosocial support 5. Can the patient actively participate in an intensive therapy program of at least 3 hrs of therapy per day at least 5 days per week? Yes 6. The potential for patient to make measurable gains while on inpatient rehab is excellent 7. Anticipated functional outcomes upon discharge from inpatient rehab are supervision  with PT, supervision with OT, supervision with SLP. 8. Estimated rehab length of stay to reach the above functional goals is: 16-18 days 9. Anticipated discharge destination: Home 10. Overall Rehab/Functional Prognosis: excellent  RECOMMENDATIONS: This patient's condition is appropriate for continued rehabilitative care in the following setting: CIR Patient has agreed to participate in recommended program. Yes Note that insurance prior authorization may be required for reimbursement for recommended care.  Comment: Mrs. Lapointe would be an excellent rehabilitation candidate. Discussed with her two daughters at bedside who are in agreement for CIR. She will be staying with one of these daughters upon discharge and will have 24/7 supervision.   Lavon Paganini Angiulli, PA-C 10/10/2019   I have personally performed a face to face diagnostic evaluation, including, but not limited to relevant history and physical exam findings, of this patient and developed relevant assessment and plan.  Additionally, I have reviewed and concur with the physician assistant's documentation above.  Leeroy Cha, MD

## 2019-10-11 DIAGNOSIS — E785 Hyperlipidemia, unspecified: Secondary | ICD-10-CM

## 2019-10-11 DIAGNOSIS — Z794 Long term (current) use of insulin: Secondary | ICD-10-CM

## 2019-10-11 DIAGNOSIS — G9341 Metabolic encephalopathy: Secondary | ICD-10-CM

## 2019-10-11 DIAGNOSIS — I129 Hypertensive chronic kidney disease with stage 1 through stage 4 chronic kidney disease, or unspecified chronic kidney disease: Secondary | ICD-10-CM

## 2019-10-11 DIAGNOSIS — R739 Hyperglycemia, unspecified: Secondary | ICD-10-CM

## 2019-10-11 DIAGNOSIS — E1122 Type 2 diabetes mellitus with diabetic chronic kidney disease: Secondary | ICD-10-CM

## 2019-10-11 DIAGNOSIS — E160 Drug-induced hypoglycemia without coma: Secondary | ICD-10-CM

## 2019-10-11 DIAGNOSIS — T383X5A Adverse effect of insulin and oral hypoglycemic [antidiabetic] drugs, initial encounter: Secondary | ICD-10-CM

## 2019-10-11 DIAGNOSIS — R569 Unspecified convulsions: Principal | ICD-10-CM

## 2019-10-11 DIAGNOSIS — E119 Type 2 diabetes mellitus without complications: Secondary | ICD-10-CM

## 2019-10-11 DIAGNOSIS — N182 Chronic kidney disease, stage 2 (mild): Secondary | ICD-10-CM

## 2019-10-11 DIAGNOSIS — R1310 Dysphagia, unspecified: Secondary | ICD-10-CM

## 2019-10-11 DIAGNOSIS — E7849 Other hyperlipidemia: Secondary | ICD-10-CM

## 2019-10-11 LAB — CBC
HCT: 29.5 % — ABNORMAL LOW (ref 36.0–46.0)
Hemoglobin: 10.3 g/dL — ABNORMAL LOW (ref 12.0–15.0)
MCH: 32.7 pg (ref 26.0–34.0)
MCHC: 34.9 g/dL (ref 30.0–36.0)
MCV: 93.7 fL (ref 80.0–100.0)
Platelets: 271 10*3/uL (ref 150–400)
RBC: 3.15 MIL/uL — ABNORMAL LOW (ref 3.87–5.11)
RDW: 13.5 % (ref 11.5–15.5)
WBC: 7.8 10*3/uL (ref 4.0–10.5)
nRBC: 0 % (ref 0.0–0.2)

## 2019-10-11 LAB — BASIC METABOLIC PANEL
Anion gap: 9 (ref 5–15)
BUN: 20 mg/dL (ref 8–23)
CO2: 25 mmol/L (ref 22–32)
Calcium: 9 mg/dL (ref 8.9–10.3)
Chloride: 97 mmol/L — ABNORMAL LOW (ref 98–111)
Creatinine, Ser: 1.1 mg/dL — ABNORMAL HIGH (ref 0.44–1.00)
GFR calc Af Amer: 54 mL/min — ABNORMAL LOW (ref >60)
GFR calc non Af Amer: 46 mL/min — ABNORMAL LOW (ref >60)
Glucose, Bld: 124 mg/dL — ABNORMAL HIGH (ref 70–99)
Potassium: 4.1 mmol/L (ref 3.5–5.1)
Sodium: 131 mmol/L — ABNORMAL LOW (ref 135–145)

## 2019-10-11 LAB — GLUCOSE, CAPILLARY
Glucose-Capillary: 112 mg/dL — ABNORMAL HIGH (ref 70–99)
Glucose-Capillary: 118 mg/dL — ABNORMAL HIGH (ref 70–99)
Glucose-Capillary: 199 mg/dL — ABNORMAL HIGH (ref 70–99)
Glucose-Capillary: 284 mg/dL — ABNORMAL HIGH (ref 70–99)
Glucose-Capillary: 399 mg/dL — ABNORMAL HIGH (ref 70–99)
Glucose-Capillary: 424 mg/dL — ABNORMAL HIGH (ref 70–99)
Glucose-Capillary: 427 mg/dL — ABNORMAL HIGH (ref 70–99)
Glucose-Capillary: 57 mg/dL — ABNORMAL LOW (ref 70–99)
Glucose-Capillary: 91 mg/dL (ref 70–99)

## 2019-10-11 LAB — GLUCOSE, RANDOM: Glucose, Bld: 447 mg/dL — ABNORMAL HIGH (ref 70–99)

## 2019-10-11 MED ORDER — INSULIN ASPART 100 UNIT/ML ~~LOC~~ SOLN: 8.0000 [IU] | Freq: Every day | SUBCUTANEOUS | Status: DC

## 2019-10-11 MED ORDER — INSULIN ASPART 100 UNIT/ML ~~LOC~~ SOLN
3.0000 [IU] | Freq: Once | SUBCUTANEOUS | Status: AC
  Administered 2019-10-11: 3 [IU] via SUBCUTANEOUS

## 2019-10-11 MED ORDER — INSULIN ASPART 100 UNIT/ML ~~LOC~~ SOLN: 0.0000 [IU] | Freq: Every day | SUBCUTANEOUS | Status: DC

## 2019-10-11 MED ORDER — INSULIN ASPART 100 UNIT/ML ~~LOC~~ SOLN
7.0000 [IU] | Freq: Every day | SUBCUTANEOUS | Status: DC
  Administered 2019-10-11: 9 [IU] via SUBCUTANEOUS

## 2019-10-11 MED ORDER — INSULIN ASPART 100 UNIT/ML ~~LOC~~ SOLN: 0.0000 [IU] | Freq: Three times a day (TID) | SUBCUTANEOUS | Status: DC

## 2019-10-11 MED ORDER — INSULIN ASPART 100 UNIT/ML ~~LOC~~ SOLN
0.0000 [IU] | Freq: Three times a day (TID) | SUBCUTANEOUS | Status: DC
  Administered 2019-10-11: 2 [IU] via SUBCUTANEOUS
  Administered 2019-10-12: 07:00:00 1 [IU] via SUBCUTANEOUS

## 2019-10-11 MED ORDER — LIVING WELL WITH DIABETES BOOK
Freq: Once | Status: AC
  Filled 2019-10-11: qty 1

## 2019-10-11 MED ORDER — INSULIN ASPART 100 UNIT/ML ~~LOC~~ SOLN
3.0000 [IU] | Freq: Once | SUBCUTANEOUS | Status: AC
  Administered 2019-10-11: 12:00:00 3 [IU] via SUBCUTANEOUS

## 2019-10-11 MED ORDER — LEVETIRACETAM 500 MG PO TABS: 500.0000 mg | ORAL_TABLET | Freq: Two times a day (BID) | ORAL | 2 refills | Status: DC

## 2019-10-11 MED ORDER — INSULIN ASPART 100 UNIT/ML ~~LOC~~ SOLN: 4.0000 [IU] | Freq: Every day | SUBCUTANEOUS | Status: DC

## 2019-10-11 MED ORDER — INSULIN ASPART 100 UNIT/ML ~~LOC~~ SOLN: 3.0000 [IU] | Freq: Three times a day (TID) | SUBCUTANEOUS | Status: DC

## 2019-10-11 MED ORDER — INSULIN ASPART 100 UNIT/ML ~~LOC~~ SOLN
3.0000 [IU] | Freq: Three times a day (TID) | SUBCUTANEOUS | Status: DC
  Administered 2019-10-11 – 2019-10-12 (×2): 3 [IU] via SUBCUTANEOUS

## 2019-10-11 MED ORDER — INSULIN DETEMIR 100 UNIT/ML ~~LOC~~ SOLN
6.0000 [IU] | Freq: Every day | SUBCUTANEOUS | Status: DC
  Administered 2019-10-12: 10:00:00 6 [IU] via SUBCUTANEOUS
  Filled 2019-10-11: qty 0.06

## 2019-10-11 MED ORDER — INSULIN ASPART 100 UNIT/ML ~~LOC~~ SOLN: 7.0000 [IU] | Freq: Every day | SUBCUTANEOUS | Status: DC

## 2019-10-11 NOTE — Progress Notes (Signed)
Inpatient Rehabilitation-Admissions Coordinator   Was notified by acute therapists today that the pt is now being recommended for home with [facility] due to progress this AM. Noted pt is being discharged home today.   AC will no longer pursue CIR admission.   Raechel Ache, OTR/L  Rehab Admissions Coordinator  [phone] 10/11/2019 3:01 PM

## 2019-10-11 NOTE — Progress Notes (Signed)
LTM EEG discontinued - no skin breakdown at unhook.   

## 2019-10-11 NOTE — Progress Notes (Signed)
  Spoke with patient family upon their request regarding their concerns with patient and the control of her Blood Sugar. I shared with them that the physician said she could be discharged the family requested not to be discharge and to have a consult done.  Relayed this to the primary to let primary doctor aware.

## 2019-10-11 NOTE — Progress Notes (Signed)
Inpatient Diabetes Program Recommendations  AACE/ADA: New Consensus Statement on Inpatient Glycemic Control (2015)  Target Ranges:  Prepandial:   less than 140 mg/dL      Peak postprandial:   less than 180 mg/dL (1-2 hours)      Critically ill patients:  140 - 180 mg/dL   Lab Results  Component Value Date   GLUCAP 427 (H) 10/11/2019   HGBA1C 8.7 (H) 10/09/2019    Review of Glycemic Control Results for [name] [name], [name] [name] (MRN [id number]) as of 10/11/2019 11:23  Ref. Range 10/10/2019 17:07 10/10/2019 19:43 10/10/2019 21:56 10/11/2019 00:05 10/11/2019 04:03 10/11/2019 04:30 10/11/2019 06:14 10/11/2019 11:15  Glucose-Capillary Latest Ref Range: 70 - 99 mg/dL 327 (H) 279 (H) 333 (H) 112 (H) 57 (L) 91 284 (H) 427 (H)   Diabetes history: DM2 Outpatient Diabetes medications: Novolog 10-12 units with breakfast/7-9 units with lunch/ 4-6 units with dinner + Levemir 6 units in the am and 4 units in the pm Current orders for Inpatient glycemic control: Novolog 0-15 tid + levemir 4 units in the am and 4 units in the pm  Inpatient Diabetes Program Recommendations:   -Levemir 6 units am & 4 units pm -Increase Novolog meal coverage to 6 units tid if eats 50% -Change Novolog correction to tid + hs 0-5  Thank you, Bethena Roys E. Joram Venson, RN, MSN, CDE  Diabetes Coordinator Inpatient Glycemic Control Team Team Pager [phone] (8am-5pm) 10/11/2019 11:27 AM

## 2019-10-11 NOTE — Progress Notes (Addendum)
STROKE TEAM PROGRESS NOTE   INTERVAL HISTORY Patient 2 daughters are at the bedside. LTM EEG no seizure, will discontinue. Pt sitting in chair, awake alert and orientated. She is able to walk in room with walker to go to bathroom and came back to chair. PT/OT recommended home PT/OT. She is on keppra, no side effect so far.   However, pt still has hyperglycemia and hypoglycemia intermittently. She was received novolog 11 unit at night for glucose at 333. Then glucose down to 57 at 4am. So she did not receive levemir 6 units. Her glucose was high during the am at 427. Received 3 units extra novolog at 1200 noon. Two daughters were very unhappy with the glucose management. I spent one hour in room and discussed with them, let them know when pt in the hospital with NPO status and then back on diet, the insulin regimen is difficulty and is different from the regimen at home. However, I make SSI changes according to pt home dose provided by the daughters. Repeat glucose at 1330, still 424, at the same time pt was eating lunch, so 8 units novolog given as per home regimen. Daughter asked to repeat glucose in 1.5 hours. I told them that PT/OT recommend home PT/OT, and pt glucose and diet would be better controlled at home instead of hospital especially with two experienced daughters who know how much insulin is suitable to pt. I would discharge pt home with daughters for better care at home. Daughters initially agreed but later refused. Repeat glucose at 3pm it was 399. Internal medicine Dr. Roosevelt Locks was consulted and he is kind enough to come by for assistance.   Vitals:   10/10/19 2336 10/11/19 0402 10/11/19 1118 10/11/19 1611  BP: (!) 151/98 (!) 135/57 (!) 126/53 (!) 136/56  Pulse: 78 72 67 70  Resp: 16 17 (!) 22 16  Temp: 98 F (36.7 C) 97.9 F (36.6 C) 98.1 F (36.7 C) 98.6 F (37 C)  TempSrc: Oral Oral Oral Oral  SpO2: 100% 99% 99% 100%  Weight:      Height:        CBC:  Recent Labs  Lab  10/08/19 0937 10/08/19 0942 10/10/19 0458 10/11/19 0448  WBC 5.1   < > 8.6 7.8  NEUTROABS 2.1  --   --   --   HGB 11.5*   < > 9.6* 10.3*  HCT 33.8*   < > 27.8* 29.5*  MCV 94.4   < > 92.4 93.7  PLT 256   < > 256 271   < > = values in this interval not displayed.    Basic Metabolic Panel:  Recent Labs  Lab 10/09/19 0309 10/09/19 1628 10/10/19 0458 10/10/19 0458 10/11/19 0448 10/11/19 1152  NA   < >  --  129*  --  131*  --   K   < >  --  4.1  --  4.1  --   CL   < >  --  95*  --  97*  --   CO2   < >  --  24  --  25  --   GLUCOSE   < >  --  189*   < > 124* 447*  BUN   < >  --  17  --  20  --   CREATININE   < >  --  1.20*  --  1.10*  --   CALCIUM   < >  --  8.9  --  9.0  --   MG  --  1.7 1.8  --   --   --   PHOS  --  3.2 2.7  --   --   --    < > = values in this interval not displayed.   Lipid Panel:     Component Value Date/Time   CHOL 272 (H) 10/09/2019 0309   TRIG 66 10/09/2019 0309   TRIG 68 10/09/2019 0309   HDL 117 10/09/2019 0309   CHOLHDL 2.3 10/09/2019 0309   VLDL 13 10/09/2019 0309   LDLCALC 142 (H) 10/09/2019 0309   HgbA1c:  Lab Results  Component Value Date   HGBA1C 8.7 (H) 10/09/2019   Urine Drug Screen:     Component Value Date/Time   LABOPIA NONE DETECTED 08/21/2019 1030   COCAINSCRNUR NONE DETECTED 08/21/2019 1030   LABBENZ NONE DETECTED 08/21/2019 1030   AMPHETMU NONE DETECTED 08/21/2019 1030   THCU NONE DETECTED 08/21/2019 1030   LABBARB NONE DETECTED 08/21/2019 1030    Alcohol Level     Component Value Date/Time   ETH <10 08/21/2019 0750    IMAGING past 24 hours Overnight EEG with video  Result Date: 10/11/2019 Lora Havens, MD     10/11/2019  2:29 PM Patient Name: [name] [name] MRN: [id number] Epilepsy Attending: Lora Havens Referring Physician/Provider: Dr. Rosalin Hawking Duration: 10/10/2019 [id number] to 10/11/2019 [id number]  Patient history: 86 year old female who presented as a code of stroke for altered mental status and aphasia.   MRI brain did not show acute stroke.  EEG evaluate for seizures.  Level of alertness: Lethargic, asleep  AEDs during EEG study: None  Technical aspects: This EEG study was done with scalp electrodes positioned according to the 10-20 International system of electrode placement. Electrical activity was acquired at a sampling rate of 500Hz  and reviewed with a high frequency filter of 70Hz  and a low frequency filter of 1Hz . EEG data were recorded continuously and digitally stored. Description: The posterior dominant rhythm consists of 7-8 Hz activity of moderate voltage (25-35 uV) seen predominantly in posterior head regions, symmetric and reactive to eye opening and eye closing. Sleep was characterized by vertex waves, sleep spindles (12-14hz ), maximal frontocentral. EEG also showed continuous generalized low amplitude 5-7Hz  theta slowing.  Hyperventilation and photic stimulation were not performed.  Abnormality -Continuous slow, generalized  IMPRESSION: This study is suggestive of mild diffuse encephalopathy, nonspecific etiology.  No seizures or epileptiform discharges were seen throughout the recording. EEG appears improved compared to previous day.  [name] [name]    PHYSICAL EXAM   Temp:  [97.9 F (36.6 C)-98.6 F (37 C)] 98.6 F (37 C) (03/24 1611) Pulse Rate:  [62-78] 70 (03/24 1611) Resp:  [12-22] 16 (03/24 1611) BP: (101-151)/(48-98) 136/56 (03/24 1611) SpO2:  [97 %-100 %] 100 % (03/24 1611)  General - Well nourished, well developed, in no apparent distress.   Ophthalmologic - fundi not visualized due to noncooperation.  Cardiovascular - Regular rhythm and rate.  Mental Status -  Level of arousal and orientation to time, place, and person were intact. Language including expression, naming, repetition, comprehension was assessed and found intact.  But paucity of speech  Cranial Nerves II - XII - II - Visual field intact OU. III, IV, VI - Extraocular movements intact. V -  Facial sensation intact bilaterally. VII - Facial movement intact bilaterally. VIII - Hearing & vestibular intact bilaterally. X - Palate elevates symmetrically. XI - Chin turning & shoulder shrug intact  bilaterally. XII - Tongue protrusion intact.  Motor Strength - The patient's strength was symmetrical in all extremities and pronator drift was absent.  Bulk was normal and fasciculations were absent.   Motor Tone - Muscle tone was assessed at the neck and appendages and was normal.  Reflexes - The patient's reflexes were symmetrical in all extremities and she had no pathological reflexes.  Sensory - Light touch, temperature/pinprick were assessed and were symmetrical.    Coordination - The patient had normal movements in the hands with no ataxia or dysmetria.  Tremor was absent.  Gait and Station - deferred.   ASSESSMENT/PLAN [name] [name] is a 86 y.o. female with history of HTN, DB, HLD, CKD presenting with altered mental status and global aphasia.  tPA administered 10/08/2019 at 1013.  Likely seizure with prolonged post ictal   Code Stroke CT head No acute abnormality. Small vessel disease. Atrophy. Sinus dz.     CTA head no LVO. L M2/MCA branch w/ mild to moderate origin stenosis. R M2/MCA moderate origin stenosis. R P2/PCA multifocal moderate stenosis. B ICA atherosclerosis w/ R ICA cavernous and paraclinoid sites mild to moderate stenoses. L ICA mild narrowing.  CTA neck B ICA mild plaque. R VA mild ostial stenosis.  Repeat CT head no acute abnormality. Small vessel disease. Atrophy.   MRI  No acute infarct. Small vessel disease. Atrophy.   2D Echo EF 65-70%  EEG no sz, moderate to severe diffuse encephalopathy  LT EEG no seizure  LE doppler no DVT  Loop interrogation requested no A. fib detected  LDL 142  HgbA1c 8.7  TSH normal, FT4 1.48(h), thyroid antibodies (1 neg, 1 slightly elevated without clinical significance)  SCDs for VTE prophylaxis  Continue  keppra 500mg  bid  aspirin 81 mg daily and clopidogrel 75 mg daily prior to admission,  on aspirin 81 mg daily. Continue ASA 81 alone on discharge.  Therapy recommendations:  Home PT/OT   Disposition:  pending   Fever with AMS, resolved  Tmax 100.3->100.7->afebrile  WBC 9.7->8.6  Clinical presentation does not consistent with ClinicalCNS infection  Not able to do LP due to plavix only be off for 2 days  Blood culture NGTD  UA neg  CXR - neg  Ammonia level normal  Empiric antibiotics for CNS infection coverage - stopped   EEG no sz  Hx TIA vs. seizure  08/2019 - TIA vs. Seizure presenting with R arm weakness, with diaphoretic episode and passed out for 2 min. DAPT x 3 weeks. MRI and MRA neg. CUS neg. EF 60-65%. EEG neg. LDL 133 and A1C 8.6. Loop placed.  Hypertension  Home meds:  Losartan 50  Stable  Resume home losartan . Long-term BP goal normotensive  Hyperlipidemia  Home meds:  lipitor 40 and omega 3, resumed in hospital  LDL 142, goal < 70  Continue statin at discharge  Diabetes type II Uncontrolled hyperglycemia  Home meds:  levemir 6uam, 4u - resumed in hospital  HgbA1c 8.7, goal < 7.0  CBGs  Hyperglycemia with overnight episodes of hypoglycemia   DB RN following  SSI ac& hs per pt home regimen dose and levemir 6u/4u -> 4u/4u-> 6/4  Uncontrolled hyperglycemia - internal medicine Dr. Roosevelt Locks consulted - appreciate help  Dysphagia   Cleared for diet - D3 thin heart carb/heart healthy diet  Speech on board  Resumed home meds   Off IVF  Other Stroke Risk Factors  Advanced age  Hx smokeless tobacco use, quit 36 yrs  ago   Other Active Problems  Hypothyroidism on synthroid  GERD on PPI  CKD 1.26->1.27->1.20->1.10  Hyponatremia 127->133->129->131  Chronic normocytic anemia 11.5->10.7->9.6->10.3  Hospital day # 3  I spent  35 minutes in total face-to-face time with the patient, more than 50% of which was spent in counseling  and coordination of care, reviewing test results, images and medication, and discussing the diagnosis of seizure, uncontrolled hyperglycemia, hypoglycemia, treatment plan and potential prognosis. This patient's care requires review of multiple databases, neurological assessment, discussion with family, other specialists and medical decision making of high complexity. I had long discussion with two daughters at bedside, updated pt current condition, treatment plan and potential prognosis, and worked closely with them for pt home regimen of SSI. I also answered all the questions. They felt not satisfied with glucose control and I consulted internal medicine for assistance. I have discussed with Dr. Roosevelt Locks and also 3W nursing direction Denies.     Rosalin Hawking, MD PhD Stroke Neurology 10/11/2019 4:53 PM   To contact Stroke Continuity provider, please refer to [email or web address] After hours, contact General Neurology

## 2019-10-11 NOTE — Progress Notes (Signed)
Spent most of the day with pt and daughters being upset about her hyperglycemia and hypoglycemia episodes.  It started on the previous unit but continued when she got here.  They were speaking very loudly and expressing being very upset with the way her blood sugars and insulin has been manged.  They were even refusing some of the doses of insulin because they thought it was too much or too little and even told this nurse on several occasions that her blood sugar needed to be rechecked and even trying to tell how much insulin I needed to give.  I explained that I have to get orders from MD and I cannot check her blood sugars any time they want me to or give her insulin according to what they wanted.  They said they did not know why, as they have managed it for years and it has not been a problem but she comes here and "No one know anything"  Nurses and doctors do not listen and don't know how to manage insulin or even treat it.  I explained to them about the sliding scale and how I would have to call the doctor for orders but they said if they want her to get half a dose, they should be able to get that, as they are not asking for more than the ordered dose.  At one point, they were asking to have it checked, treated with 1/2 the ordered dose and then rechecked 1.5 hours later and every 30 -45 min until it was under 120.  They were very upset because I told them I could not do that and I got the doctor to come see them because of them being so angry.  He spent at least an hour at the bedside trying to get her orders the way they said they do them at home but when he did exactly what they said, they still got upset because her sugars were in the 400s and they only allowed her to get 3 units when it called for at least 15 units and the doctor told me to give 15 units, as 350-400 calls for 15 units and the doctor gave me a verbal order to give 15 units but they did not allow it.  Then her blood sugar stayed above 400  and they became even angrier and were making comments about the competency of nurses and doctors.  The doctor said that he would discharge her so they could manage it, as therapy did say she was safe to go home so he said they could discharge.  They agreed at first then they decided that they were not going to go home.  After that, Dr decided to get internal medicine doctor involved to help manage and that doctor came up and spent time with them and adjusted orders to their liking and they calmed down.

## 2019-10-11 NOTE — Progress Notes (Addendum)
Physical Therapy Treatment Patient Details Name: [name] [name] MRN: [id number] DOB: 06-Apr-1934 Today's Date: 10/11/2019    History of Present Illness 86 y.o. female who presented to [facility] ED as a code stroke for AMS, aphasia.  Arrived as code stroke and had TPA administered.  MRI negative for infarct.  Patient admitted for r/o seizure vs. infections process. PMH including DM2, HTN, HLD, and CKD.    PT Comments    Pt supine in bed soiled with urine from faulty purwick.  She required assistance to clean peri area.  Post clean up performed transfer to edge of bed and OOB with min assistance.  She was able to ambulate to the sink with min assistance and in halls for 250 ft with good tolerance.  Orthostatic vitals stable this session.  Based on functional gains will update recommendations with HHPT at this time.  Will inform supervising PT of need for change in recommendations at this time. Plan for stair training next session.   Follow Up Recommendations  Home health PT;Supervision/Assistance - 24 hour     Equipment Recommendations  3in1 (PT)(Has RW at home if this is not true she will require a RW at d/c.)    Recommendations for Other Services Rehab consult     Precautions / Restrictions Precautions Precautions: Fall Precaution Comments: R inattention Restrictions Weight Bearing Restrictions: No    Mobility  Bed Mobility Overal bed mobility: Needs Assistance Bed Mobility: Supine to Sit     Supine to sit: Min guard;+2 for safety/equipment     General bed mobility comments: HOB elevated, use of bed rail, assistance to move hips to edge of bed once in sitting with posterior pelvic tilt noted,  Transfers Overall transfer level: Needs assistance Equipment used: Rolling walker (2 wheeled) Transfers: Sit to/from Stand Sit to Stand: Min assist;+2 safety/equipment         General transfer comment: Pt required bracing of RW and assistance to boost into standing.  Posterior bias  noted with weight shifted back on her heels.  Cues for weight shifting forward to maintain static stance.  Ambulation/Gait Ambulation/Gait assistance: Min assist;+2 safety/equipment Gait Distance (Feet): 250 Feet Assistive device: Rolling walker (2 wheeled) Gait Pattern/deviations: Step-through pattern;Trunk flexed;Shuffle;Decreased stride length;Leaning posteriorly     General Gait Details: Pt required assistance to turn device.  Cues provided for RW safety, obstacle negotiation, and balance.  Increased usteadiness noted with turns and backing.  Pt able to perform increased gt this session.   Stairs             Wheelchair Mobility    Modified Rankin (Stroke Patients Only) Modified Rankin (Stroke Patients Only) Pre-Morbid Rankin Score: No significant disability Modified Rankin: Moderately severe disability     Balance Overall balance assessment: Needs assistance Sitting-balance support: Feet supported Sitting balance-Leahy Scale: Fair Sitting balance - Comments: POsterior tilt with slight posterior bias. Postural control: Posterior lean   Standing balance-Leahy Scale: Poor Standing balance comment: Retro lean in standing with pt requiring cues and min assist to self-correct.                             Cognition Arousal/Alertness: Awake/alert Behavior During Therapy: WFL for tasks assessed/performed Overall Cognitive Status: Impaired/Different from baseline Area of Impairment: Attention;Memory;Following commands;Safety/judgement                   Current Attention Level: Sustained Memory: Decreased short-term memory Following Commands: Follows one step commands  consistently;Follows one step commands with increased time Safety/Judgement: Decreased awareness of safety;Decreased awareness of deficits     General Comments: Pt pleasant and willing to participate in therapy. Improved attention to right side noted this date. Pt able to follow simple one  step instructions. Slight perseveration with grooming tasks at the sink.       Exercises      General Comments General comments (skin integrity, edema, etc.): No signs/symptoms of distress. Orthostatic BPs taken with minimal change. No reports of dizziness throughout.       Pertinent Vitals/Pain Pain Assessment: No/denies pain Faces Pain Scale: No hurt    Home Living                      Prior Function            PT Goals (current goals can now be found in the care plan section) Acute Rehab PT Goals Patient Stated Goal: per family to return home Potential to Achieve Goals: Good Progress towards PT goals: Progressing toward goals    Frequency    Min 4X/week      PT Plan Discharge plan needs to be updated    Co-evaluation PT/OT/SLP Co-Evaluation/Treatment: Yes Reason for Co-Treatment: Complexity of the patient's impairments (multi-system involvement) PT goals addressed during session: Mobility/safety with mobility OT goals addressed during session: ADL's and self-care      AM-PAC PT "6 Clicks" Mobility   Outcome Measure  Help needed turning from your back to your side while in a flat bed without using bedrails?: A Little Help needed moving from lying on your back to sitting on the side of a flat bed without using bedrails?: A Little Help needed moving to and from a bed to a chair (including a wheelchair)?: A Little Help needed standing up from a chair using your arms (e.g., wheelchair or bedside chair)?: A Little Help needed to walk in hospital room?: A Little Help needed climbing 3-5 steps with a railing? : A Little 6 Click Score: 18    End of Session Equipment Utilized During Treatment: Gait belt Activity Tolerance: Patient tolerated treatment well Patient left: in bed;with call bell/phone within reach;with family/visitor present;with bed alarm set Nurse Communication: Mobility status PT Visit Diagnosis: Other abnormalities of gait and mobility  (R26.89);Other symptoms and signs involving the nervous system (R29.898)     Time: [id number] PT Time Calculation (min) (ACUTE ONLY): 39 min  Charges:  $Gait Training: 8-22 mins $Therapeutic Activity: 8-22 mins                     Erasmo Leventhal , PTA Acute Rehabilitation Services Pager [phone] Office [phone]     Tyree Fluharty Eli Hose 10/11/2019, 12:51 PM

## 2019-10-11 NOTE — TOC Transition Note (Signed)
Transition of Care [facility]) - CM/SW Discharge Note   Patient Details  Name: [name] [name] MRN: [id number] Date of Birth: April 03, 1934  [facility] CM/SW Contact:  Pollie Friar, RN Phone Number: 10/11/2019, 3:08 PM   Clinical Narrative:    Pt discharging home with [facility] services through [facility]. [name] [name] with [facility] accepted the referral.  3 in 1 to be delivered to the room per [employer]. Daughter: [name] [name] requested [facility] information. CM provided them the information and application packet.  Daughters state she has a walker at home and 24 hour supervision. They are going to provide transport home.   Final next level of care: Home w Home Health Services Barriers to Discharge: No Barriers Identified   Patient Goals and CMS Choice   CMS Medicare.gov Compare Post Acute Care list provided to:: Patient Represenative (must comment) Choice offered to / list presented to : Adult Children  Discharge Placement                       Discharge Plan and Services                DME Arranged: 3-N-1 DME Agency: [employer] Date DME Agency Contacted: 10/11/19   Representative spoke with at DME Agency: [place]: PT, [place] Agency: [place] Date [place]: 10/11/19   Representative spoke with at [place]: [place] ([place]) Interventions     Readmission Risk Interventions No flowsheet data found.

## 2019-10-11 NOTE — Progress Notes (Addendum)
Occupational Therapy Treatment Patient Details Name: [name] [name] MRN: [id number] DOB: [birth year] Today's Date: 10/11/2019    History of present illness 86 y.o. female who presented to [facility] ED as a code stroke for AMS, aphasia.  Arrived as code stroke and had TPA administered.  MRI negative for infarct.  Patient admitted for r/o seizure vs. infections process. PMH including DM2, HTN, HLD, and CKD.   OT comments  Cotx with PT to maximize pt safety and functional performance. Pt making good progress in therapy demonstrating improved attention to right side, ability to follow commands, and increased independence with ADLs and mobility. Pt able to ambulate to bedroom sink with RW and min assist for balance. Pt stood at the sink to complete grooming/hygiene tasks able to scan and locate items in right visual field without assist. Pt demonstrated good bilateral integration and Ona during grooming tasks noting only 1 drop throughout. Pt tolerated standing at the sink and ambulating around unit a total of 17 min with 0 rest breaks. Utilized RW and min assist for balance. Retro lean in standing with pt requiring cues and assist to correct. OT will continue to follow acutely.     Follow Up Recommendations  Home health OT;Supervision/Assistance - 24 hour    Equipment Recommendations  3 in 1 bedside commode;Tub/shower bench    Recommendations for Other Services      Precautions / Restrictions Precautions Precautions: Fall Precaution Comments: R inattention Restrictions Weight Bearing Restrictions: No       Mobility Bed Mobility Overal bed mobility: Needs Assistance Bed Mobility: Supine to Sit     Supine to sit: Min guard     General bed mobility comments: HOB elevated, use of bed rail  Transfers Overall transfer level: Needs assistance Equipment used: Rolling walker (2 wheeled) Transfers: Sit to/from Stand Sit to Stand: Min guard         General transfer comment: Cues for hand  placement with poor understanding and follow through. Min guard to ensure balance and safety.     Balance Overall balance assessment: Needs assistance Sitting-balance support: Feet supported Sitting balance-Leahy Scale: Fair       Standing balance-Leahy Scale: Fair(Poor to fair) Standing balance comment: Retro lean in standing with pt requiring cues and min assist to self-correct.                            ADL either performed or assessed with clinical judgement   ADL Overall ADL's : Needs assistance/impaired     Grooming: Min guard;Minimal assistance;Wash/dry hands;Wash/dry face;Oral care;Standing Grooming Details (indicate cue type and reason): Variable min guard to min assist for balance while standing at the sink.                     Toileting- Clothing Manipulation and Hygiene: Maximal assistance;Bed level Toileting - Clothing Manipulation Details (indicate cue type and reason): Pt incontinent of bowel and bladder in bed requiring assist to get cleaned up at bed level      Functional mobility during ADLs: Minimal assistance;Rolling walker General ADL Comments: Pt able to ambulate to/from bedroom sink and around unit with RW and min assist for balance. Pt tolerated standing and ambulating 17 min. Pt demonstrated improved attention to right side, manuevering RW around objects on right. Pt still tends to veer to the right when walking.      Vision       Perception  Praxis      Cognition Arousal/Alertness: Awake/alert Behavior During Therapy: WFL for tasks assessed/performed Overall Cognitive Status: Impaired/Different from baseline Area of Impairment: Attention;Memory;Following commands;Safety/judgement                   Current Attention Level: Sustained Memory: Decreased short-term memory Following Commands: Follows one step commands consistently;Follows one step commands with increased time Safety/Judgement: Decreased awareness of  safety;Decreased awareness of deficits     General Comments: Pt pleasant and willing to participate in therapy. Improved attention to right side noted this date. Pt able to follow simple one step instructions. Slight perseveration with grooming tasks at the sink.         Exercises     Shoulder Instructions       General Comments No signs/symptoms of distress. Orthostatic BPs taken with minimal change. No reports of dizziness throughout.     Pertinent Vitals/ Pain       Pain Assessment: No/denies pain  Home Living                                          Prior Functioning/Environment              Frequency           Progress Toward Goals  OT Goals(current goals can now be found in the care plan section)  Progress towards OT goals: Progressing toward goals  ADL Goals Pt Will Perform Grooming: with min assist;sitting Pt Will Transfer to Toilet: with min guard assist;ambulating;bedside commode Additional ADL Goal #1: Pt will sustain attention to simple ADL with Min cues Additional ADL Goal #2: Pt will locate three grooming items in right visual field with Min cues Additional ADL Goal #3: Pt will follow 75% of one step commands during ADLs  Plan Discharge plan remains appropriate    Co-evaluation    PT/OT/SLP Co-Evaluation/Treatment: Yes Reason for Co-Treatment: To address functional/ADL transfers   OT goals addressed during session: ADL's and self-care      AM-PAC OT "6 Clicks" Daily Activity     Outcome Measure   Help from another person eating meals?: A Lot Help from another person taking care of personal grooming?: A Little Help from another person toileting, which includes using toliet, bedpan, or urinal?: A Lot Help from another person bathing (including washing, rinsing, drying)?: Total Help from another person to put on and taking off regular upper body clothing?: A Lot Help from another person to put on and taking off regular  lower body clothing?: Total 6 Click Score: 11    End of Session Equipment Utilized During Treatment: Gait belt;Rolling walker  OT Visit Diagnosis: Unsteadiness on feet (R26.81);Other abnormalities of gait and mobility (R26.89);Muscle weakness (generalized) (M62.81);Other symptoms and signs involving cognitive function   Activity Tolerance Patient tolerated treatment well   Patient Left in chair(Left with PT)   Nurse Communication Mobility status        Time: [id number] OT Time Calculation (min): 32 min  Charges: OT General Charges $OT Visit: 1 Visit OT Treatments $Therapeutic Activity: 8-22 mins  Mauri Brooklyn OTR/L [phone]   Mauri Brooklyn 10/11/2019, 11:23 AM

## 2019-10-11 NOTE — Procedures (Addendum)
Patient Name: MAZEY BALTON  MRN: [id number]  Epilepsy Attending: Lora Havens  Referring Physician/Provider: Dr. Rosalin Hawking Duration: 10/10/2019 [id number] to 10/11/2019 [id number]  Patient history: 86 year old female who presented as a code of stroke for altered mental status and aphasia.  MRI brain did not show acute stroke.  EEG evaluate for seizures.  Level of alertness: Lethargic, asleep  AEDs during EEG study: None  Technical aspects: This EEG study was done with scalp electrodes positioned according to the 10-20 International system of electrode placement. Electrical activity was acquired at a sampling rate of 500Hz  and reviewed with a high frequency filter of 70Hz  and a low frequency filter of 1Hz . EEG data were recorded continuously and digitally stored.    Description: The posterior dominant rhythm consists of 7-8 Hz activity of moderate voltage (25-35 uV) seen predominantly in posterior head regions, symmetric and reactive to eye opening and eye closing. Sleep was characterized by vertex waves, sleep spindles (12-14hz ), maximal frontocentral. EEG also showed continuous generalized low amplitude 5-7Hz  theta slowing.  Hyperventilation and photic stimulation were not performed.  Abnormality -Continuous slow, generalized  IMPRESSION: This study is suggestive of mild diffuse encephalopathy, nonspecific etiology.  No seizures or epileptiform discharges were seen throughout the recording.  EEG appears improved compared to previous day.   Clemence Stillings Barbra Sarks

## 2019-10-11 NOTE — Progress Notes (Signed)
Hypoglycemic Event  CBG: 57  Treatment: PO intake  Symptoms:None  Follow-up CBG: Time:0430 CBG Result:91  Possible Reasons for Event:  BG too sensitive to insulin  Comments/MD notified: MD S. Alma

## 2019-10-11 NOTE — Consult Note (Signed)
History and Physical    JOSEFA DEARY [id number] DOB: 1934/05/19 DOA: 10/08/2019  PCP: Lajean Manes, MD   Patient coming from: Home  I have personally briefly reviewed patient's old medical records in [place]  Chief Complaint: Sugar fluctuate  HPI: SOLAI SARA is a 86 y.o. female with medical history significant of IDDM, hypothyroidism, CKD stage II, hypertension hyperlipidemia, was admitted on neurology service for evaluation of altered mental status and aphasia.  CT/MRI of the brain negative for acute changes on 10/08/2019. Patient received TPA and CT angiogram of head and neck no intracranial large vessel occlusion. Follow-up MRI 10/09/2019 again negative.  Echocardiogram with ejection fraction of [id number] grade 1 diastolic dysfunction.  EEG negative for seizure.  Lower extremity Dopplers negative.  Patient was initially admitted to ICU, placed n.p.o. status her sugar was poorly controlled, however since yesterday patient resumed diet, her sugar became uncontrolled, fluctuating from more than 300 down to 57 at 4am.  She follows her endocrinologist, with customized sliding scale, she is on the same sliding scale for breakfast, moderate for lunch and low-dose of scale for dinner.  Neurology Dr. Erlinda Hong asked Hospitalist to help manage pt's glucose.   Review of Systems: As per HPI otherwise 10 point review of systems negative.    Past Medical History:  Diagnosis Date  . Arthritis    "knees, legs" (06/10/2016)  . Ascites   . Chronic kidney disease    "related to my diabetes"  . Chronic lower back pain   . Diabetes mellitus without complication ([place])   . Eczema   . GERD (gastroesophageal reflux disease)   . High cholesterol   . Hyperlipidemia   . Hypertension   . Hypothyroidism   . OAB (overactive bladder)   . Osteoporosis   . Thyroid disease   . Type II diabetes mellitus ([place])   . Urticaria     Past Surgical History:  Procedure Laterality Date  . ABDOMINAL  HYSTERECTOMY    . KNEE ARTHROSCOPY    . LAPAROTOMY N/A 12/03/2016   Procedure: EXPLORATORY LAPAROTOMY, LYSIS OF ADHESIONS;  Surgeon: Armandina Gemma, MD;  Location: [facility];  Service: General;  Laterality: N/A;  . LOOP RECORDER INSERTION N/A 08/23/2019   Procedure: LOOP RECORDER INSERTION;  Surgeon: Evans Lance, MD;  Location: [place] [facility];  Service: Cardiovascular;  Laterality: N/A;  . vocal cord polyps       reports that she has never smoked. She quit smokeless tobacco use about 36 years ago.  Her smokeless tobacco use included chew. She reports that she does not drink alcohol or use drugs.  Allergies  Allergen Reactions  . Sulfonamide Derivatives Hives  . Bactrim [Sulfamethoxazole-Trimethoprim] Rash    Reported by [facility]  . Lisinopril Cough    Reported by [facility]    Family History  Problem Relation Age of Onset  . Diabetes Mother   . Hypertension Mother   . Diabetes Sister   . Diabetes Brother   . Asthma Father   . Allergic rhinitis Neg Hx   . Eczema Neg Hx   . Urticaria Neg Hx      Prior to Admission medications   Medication Sig Start Date End Date Taking? Authorizing Provider  acetaminophen (TYLENOL) 500 MG tablet Take 500-1,000 mg by mouth at bedtime as needed for mild pain.    Yes [provider]  aspirin EC 81 MG tablet Take 81 mg by mouth daily.   Yes [provider]  atorvastatin (LIPITOR) 40 MG tablet Take 40 mg by mouth daily at 6 PM.  02/16/19  Yes [provider]  calcium-vitamin D 250-100 MG-UNIT tablet Take 1 tablet by mouth daily.    Yes [provider]  cetirizine (ZYRTEC) 10 MG tablet Take 10 mg by mouth daily as needed for allergies.   Yes [provider]  cholecalciferol (VITAMIN D) 1000 units tablet Take 1,000 Units by mouth daily.   Yes [provider]  clopidogrel (PLAVIX) 75 MG tablet Take 75 mg by mouth daily.   Yes [provider]  FLUAD 0.5 ML SUSY ADM 0.5ML IM UTD  03/09/18  Yes [provider]  insulin aspart (NOVOLOG) 100 UNIT/ML injection Inject 4-12 Units into the skin See admin instructions. Inject 10-12 units at breakfast, 7-9 units at lunch and 4-6 units at dinner.   Yes [provider]  Insulin Detemir (LEVEMIR FLEXTOUCH) 100 UNIT/ML Pen Inject 4-6 Units into the skin See admin instructions. Inject 6 units in the morning and 4 units at bedtime.   Yes [provider]  Iron-FA-B Cmp-C-Biot-Probiotic (FUSION PLUS) CAPS Take 1 capsule by mouth daily.  03/02/19  Yes [provider]  losartan (COZAAR) 50 MG tablet Take 50 mg by mouth daily.  01/24/19  Yes [provider]  Multiple Vitamin (MULTIVITAMIN WITH MINERALS) TABS tablet Take 1 tablet by mouth daily.   Yes [provider]  nystatin (MYCOSTATIN) 100000 UNIT/ML suspension Take 4 mLs by mouth at bedtime as needed.   Yes [provider]  Omega-3 Fatty Acids (FISH OIL) 1000 MG CAPS Take 1,000 mg by mouth 2 (two) times daily.   Yes [provider]  pantoprazole (PROTONIX) 40 MG tablet Take 40 mg by mouth daily.  03/25/19  Yes [provider]  polyethylene glycol (MIRALAX / GLYCOLAX) 17 g packet Take 17 g by mouth daily as needed for mild constipation.   Yes [provider]  SYNTHROID 137 MCG tablet Take 137 mcg by mouth every morning. 10/04/19  Yes [provider]  B-D UF III MINI PEN NEEDLES 31G X 5 MM MISC USE AS DIRECTED TID 02/14/19   [provider]  levETIRAcetam (KEPPRA) 500 MG tablet Take 1 tablet (500 mg total) by mouth 2 (two) times daily. 10/11/19   Donzetta Starch, NP    Physical Exam: Vitals:   10/10/19 2336 10/11/19 0402 10/11/19 1118 10/11/19 1611  BP: (!) 151/98 (!) 135/57 (!) 126/53 (!) 136/56  Pulse: 78 72 67 70  Resp: 16 17 (!) 22 16  Temp: 98 F (36.7 C) 97.9 F (36.6 C) 98.1 F (36.7 C) 98.6 F (37 C)  TempSrc: Oral Oral Oral Oral  SpO2: 100% 99% 99% 100%  Weight:      Height:         Constitutional: NAD, calm, comfortable Vitals:   10/10/19 2336 10/11/19 0402 10/11/19 1118 10/11/19 1611  BP: (!) 151/98 (!) 135/57 (!) 126/53 (!) 136/56  Pulse: 78 72 67 70  Resp: 16 17 (!) 22 16  Temp: 98 F (36.7 C) 97.9 F (36.6 C) 98.1 F (36.7 C) 98.6 F (37 C)  TempSrc: Oral Oral Oral Oral  SpO2: 100% 99% 99% 100%  Weight:      Height:       Eyes: PERRL, lids and conjunctivae normal ENMT: Mucous membranes are moist. Posterior pharynx clear of any exudate or lesions.Normal dentition.  Neck: normal, supple, no masses, no thyromegaly Respiratory: clear to auscultation bilaterally, no wheezing,  no crackles. Normal respiratory effort. No accessory muscle use.  Cardiovascular: Regular rate and rhythm, no murmurs / rubs / gallops. No extremity edema. 2+ pedal pulses. No carotid bruits.  Abdomen: no tenderness, no masses palpated. No hepatosplenomegaly. Bowel sounds positive.  Musculoskeletal: no clubbing / cyanosis. No joint deformity upper and lower extremities. Good ROM, no contractures. Normal muscle tone.  Skin: no rashes, lesions, ulcers. No induration Neurologic: CN 2-12 grossly intact. Sensation intact, DTR normal. Strength 5/5 in all 4.  Psychiatric: Normal judgment and insight. Alert and oriented x 3. Normal mood.   Labs on Admission: I have personally reviewed following labs and imaging studies  CBC: Recent Labs  Lab 10/08/19 0937 10/08/19 0942 10/09/19 0309 10/10/19 0458 10/11/19 0448  WBC 5.1  --  9.7 8.6 7.8  NEUTROABS 2.1  --   --   --   --   HGB 11.5* 11.6* 10.7* 9.6* 10.3*  HCT 33.8* 34.0* 31.2* 27.8* 29.5*  MCV 94.4  --  95.4 92.4 93.7  PLT 256  --  274 256 [id number]   Basic Metabolic Panel: Recent Labs  Lab 10/08/19 0937 10/08/19 0937 10/08/19 0942 10/09/19 0309 10/09/19 1503 10/09/19 1628 10/10/19 0458 10/11/19 0448 10/11/19 1152  NA 127*  --  126* 133*  --   --  129* 131*  --   K 4.1  --  4.1 4.6  --   --  4.1 4.1  --   CL 93*  --  93*  97*  --   --  95* 97*  --   CO2 24  --   --  17*  --   --  24 25  --   GLUCOSE 170*   < > 159* 179*  --   --  189* 124* 447*  BUN 18  --  22 19  --   --  17 20  --   CREATININE 1.26*  --  1.20* 1.27*  --   --  1.20* 1.10*  --   CALCIUM 9.9  --   --  9.7  --   --  8.9 9.0  --   MG  --   --   --   --  1.7 1.7 1.8  --   --   PHOS  --   --   --  3.5 3.2 3.2 2.7  --   --    < > = values in this interval not displayed.   GFR: Estimated Creatinine Clearance: 34.9 mL/min (A) (by C-G formula based on SCr of 1.1 mg/dL (H)). Liver Function Tests: Recent Labs  Lab 10/08/19 0937 10/09/19 0309  AST 35  --   ALT 32  --   ALKPHOS 66  --   BILITOT 0.7  --   PROT 6.8  --   ALBUMIN 3.6 3.5   No results for input(s): LIPASE, AMYLASE in the last 168 hours. Recent Labs  Lab 10/09/19 1503  AMMONIA 24   Coagulation Profile: Recent Labs  Lab 10/08/19 0937  INR 1.0   Cardiac Enzymes: No results for input(s): CKTOTAL, CKMB, CKMBINDEX, TROPONINI in the last 168 hours. BNP (last 3 results) No results for input(s): PROBNP in the last 8760 hours. HbA1C: Recent Labs    10/09/19 0309  HGBA1C 8.7*   CBG: Recent Labs  Lab 10/11/19 0614 10/11/19 1115 10/11/19 1330 10/11/19 1456 10/11/19 1750  GLUCAP 284* 427* 424* 399* 199*   Lipid Profile: Recent Labs    10/09/19 0309  CHOL 272*  HDL 117  LDLCALC 142*  TRIG 66  68  CHOLHDL 2.3   Thyroid Function Tests: Recent Labs    10/09/19 1503  TSH 1.303  FREET4 1.48*   Anemia Panel: No results for input(s): VITAMINB12, FOLATE, FERRITIN, TIBC, IRON, RETICCTPCT in the last 72 hours. Urine analysis:    Component Value Date/Time   COLORURINE YELLOW 10/09/2019 2100   APPEARANCEUR CLEAR 10/09/2019 2100   LABSPEC 1.016 10/09/2019 2100   PHURINE 5.0 10/09/2019 2100   GLUCOSEU >=500 (A) 10/09/2019 2100   HGBUR NEGATIVE 10/09/2019 2100   BILIRUBINUR NEGATIVE 10/09/2019 2100   BILIRUBINUR neg 05/06/2015 1421   KETONESUR 5 (A) 10/09/2019  2100   PROTEINUR 100 (A) 10/09/2019 2100   UROBILINOGEN 1.0 05/06/2015 1421   UROBILINOGEN 0.2 12/25/2014 1206   NITRITE NEGATIVE 10/09/2019 2100   LEUKOCYTESUR NEGATIVE 10/09/2019 2100    Radiological Exams on Admission: Overnight EEG with video  Result Date: 10/11/2019 Lora Havens, MD     10/11/2019  2:29 PM Patient Name: KENDYL GORR MRN: [id number] Epilepsy Attending: Lora Havens Referring Physician/Provider: Dr. Rosalin Hawking Duration: 10/10/2019 [id number] to 10/11/2019 [id number]  Patient history: 86 year old female who presented as a code of stroke for altered mental status and aphasia.  MRI brain did not show acute stroke.  EEG evaluate for seizures.  Level of alertness: Lethargic, asleep  AEDs during EEG study: None  Technical aspects: This EEG study was done with scalp electrodes positioned according to the 10-20 International system of electrode placement. Electrical activity was acquired at a sampling rate of 500Hz  and reviewed with a high frequency filter of 70Hz  and a low frequency filter of 1Hz . EEG data were recorded continuously and digitally stored. Description: The posterior dominant rhythm consists of 7-8 Hz activity of moderate voltage (25-35 uV) seen predominantly in posterior head regions, symmetric and reactive to eye opening and eye closing. Sleep was characterized by vertex waves, sleep spindles (12-14hz ), maximal frontocentral. EEG also showed continuous generalized low amplitude 5-7Hz  theta slowing.  Hyperventilation and photic stimulation were not performed.  Abnormality -Continuous slow, generalized  IMPRESSION: This study is suggestive of mild diffuse encephalopathy, nonspecific etiology.  No seizures or epileptiform discharges were seen throughout the recording. EEG appears improved compared to previous day.  Priyanka Barbra Sarks    EKG: Independently reviewed.   Assessment/Plan Principal Problem:   Seizures ([place]) with prolonged postictal period Active Problems:    Type 2 diabetes mellitus without complication, with long-term current use of insulin (HCC)   Essential hypertension   Hyperlipidemia   Hyperglycemia   Malnutrition ([place])  Uncontrolled diabetes -Seems like her glucose levels were brittle, I propose remain patient on the same home dosage of Levemir, add a standing dose of 3 unit of NovoLog for each meal on top of a moderate sliding scale, which is somewhat mimicking patient's home regimen.  Was discussed in detail with patient's daughters, both understood and agreed. -Her most recent A1c 8.7, indicating patient may need more Levemir versus she has underlying insulin resistance.  Patient daughters at this point not willing to change her home insulin regimen, I encouraged him to discuss this issue with patient's endocrinologist as outpatient.  Acute metabolic encephalopathy, resolved Discussed with neurology, likely from seizure. And as per primary team  Hypertension Continue losartan  Lipidemia On statin  Dysphagia Improved   DVT prophylaxis: Heparin subcu Code Status: Full code Family Communication: Discussed with the daughters at bedside, all questions answered with my best knowledge   Bernece Gall  Archie Patten MD [facility] Pager [phone]   10/11/2019, 6:21 PM

## 2019-10-12 LAB — CBC
HCT: 27.6 % — ABNORMAL LOW (ref 36.0–46.0)
Hemoglobin: 9.6 g/dL — ABNORMAL LOW (ref 12.0–15.0)
MCH: 32.7 pg (ref 26.0–34.0)
MCHC: 34.8 g/dL (ref 30.0–36.0)
MCV: 93.9 fL (ref 80.0–100.0)
Platelets: 251 10*3/uL (ref 150–400)
RBC: 2.94 MIL/uL — ABNORMAL LOW (ref 3.87–5.11)
RDW: 13.2 % (ref 11.5–15.5)
WBC: 9.2 10*3/uL (ref 4.0–10.5)
nRBC: 0 % (ref 0.0–0.2)

## 2019-10-12 LAB — GLUCOSE, CAPILLARY
Glucose-Capillary: 132 mg/dL — ABNORMAL HIGH (ref 70–99)
Glucose-Capillary: 146 mg/dL — ABNORMAL HIGH (ref 70–99)
Glucose-Capillary: 153 mg/dL — ABNORMAL HIGH (ref 70–99)

## 2019-10-12 LAB — BASIC METABOLIC PANEL
Anion gap: 8 (ref 5–15)
BUN: 21 mg/dL (ref 8–23)
CO2: 26 mmol/L (ref 22–32)
Calcium: 9.2 mg/dL (ref 8.9–10.3)
Chloride: 97 mmol/L — ABNORMAL LOW (ref 98–111)
Creatinine, Ser: 1.1 mg/dL — ABNORMAL HIGH (ref 0.44–1.00)
GFR calc Af Amer: 54 mL/min — ABNORMAL LOW (ref >60)
GFR calc non Af Amer: 46 mL/min — ABNORMAL LOW (ref >60)
Glucose, Bld: 153 mg/dL — ABNORMAL HIGH (ref 70–99)
Potassium: 4.7 mmol/L (ref 3.5–5.1)
Sodium: 131 mmol/L — ABNORMAL LOW (ref 135–145)

## 2019-10-12 NOTE — Progress Notes (Signed)
Inpatient Diabetes Program Recommendations  AACE/ADA: New Consensus Statement on Inpatient Glycemic Control (2015)  Target Ranges:  Prepandial:   less than 140 mg/dL      Peak postprandial:   less than 180 mg/dL (1-2 hours)      Critically ill patients:  140 - 180 mg/dL   Lab Results  Component Value Date   GLUCAP 146 (H) 10/12/2019   HGBA1C 8.7 (H) 10/09/2019    Review of Glycemic Control  Results for [name] [name], [name] [name] (MRN [id number]) as of 10/12/2019 10:17  Ref. Range 10/11/2019 20:17 10/12/2019 00:00 10/12/2019 06:28 10/12/2019 09:05  Glucose-Capillary Latest Ref Range: 70 - 99 mg/dL 118 (H) 153 (H) 132 (H) 146 (H)    Diabetes history: DM2 Outpatient Diabetes medications: Levemir 6 units QAM and 4 units QPM + Novolog 10-12 units with breakfast + 7-9 units with lunch + 4-6 units with dinner  Note:  MD asked DM coordinator to see patient and speak with daughters as they are not willing to discharge because CBG's are too elevated and they want RN to check CBG right after eating which is unrealistic.  Spoke with patient and daughters at bedside. We reviewed the home DM regimen.  They state the above home regimen works well for their mother.  They were frustrated because patient became too low after correction with Novolog.  Explained to daughters that there are many variables in the hospital compared to home.  Her CBG's are very good today.  Explained it would not be necessary to check CBG after a meal as her blood sugar will be elevated as rapid insulin duration lasts 3-5 hrs.  They are comfortable with going home today.  They have all supplies needed.  We reviewed hypoglycemia and treatment.  Thank you, Reche Dixon, RN, BSN Diabetes Coordinator Inpatient Diabetes Program [phone] (team pager from 8a-5p)

## 2019-10-12 NOTE — Progress Notes (Signed)
Physical Therapy Treatment Patient Details Name: [name] [name] MRN: [id number] DOB: Nov 29, 1933 Today's Date: 10/12/2019    History of Present Illness 86 y.o. female who presented to [facility] ED as a code stroke for AMS, aphasia.  Arrived as code stroke and had TPA administered.  MRI negative for infarct.  Patient admitted for r/o seizure vs. infections process. PMH including DM2, HTN, HLD, and CKD.    PT Comments    Pt required assistance for safety ranging from supervision to min guard.  Presents with minor safety concerns and continues to require 24 hr assistance at home.     Follow Up Recommendations  Home health PT;Supervision/Assistance - 24 hour     Equipment Recommendations  3in1 (PT)    Recommendations for Other Services Rehab consult     Precautions / Restrictions Precautions Precautions: Fall Precaution Comments: R inattention Restrictions Weight Bearing Restrictions: No    Mobility  Bed Mobility Overal bed mobility: Needs Assistance Bed Mobility: Supine to Sit     Supine to sit: Supervision        Transfers Overall transfer level: Needs assistance Equipment used: Rolling walker (2 wheeled) Transfers: Sit to/from Stand Sit to Stand: Supervision         General transfer comment: LOB from commode and returned to sitting, require cues for hand placement and able to perform with supervision.  Ambulation/Gait Ambulation/Gait assistance: Min guard Gait Distance (Feet): 300 Feet Assistive device: Rolling walker (2 wheeled) Gait Pattern/deviations: Step-through pattern;Trunk flexed;Shuffle;Decreased stride length;Leaning posteriorly     General Gait Details: Better carryover, minor instability but no overt LOB.   Stairs Stairs: Yes Stairs assistance: Supervision Stair Management: Two rails;Forwards Number of Stairs: 6 General stair comments: Pt required close supervision for safety.   Wheelchair Mobility    Modified Rankin (Stroke Patients  Only) Modified Rankin (Stroke Patients Only) Pre-Morbid Rankin Score: No significant disability Modified Rankin: Moderately severe disability     Balance Overall balance assessment: Needs assistance Sitting-balance support: Feet supported Sitting balance-Leahy Scale: Fair Sitting balance - Comments: POsterior tilt with slight posterior bias.       Standing balance comment: Retro lean in standing with pt requiring cues and min assist to self-correct.                             Cognition Arousal/Alertness: Awake/alert Behavior During Therapy: WFL for tasks assessed/performed Overall Cognitive Status: Impaired/Different from baseline Area of Impairment: Attention;Memory;Following commands;Safety/judgement                   Current Attention Level: Sustained Memory: Decreased short-term memory Following Commands: Follows one step commands consistently;Follows one step commands with increased time Safety/Judgement: Decreased awareness of safety;Decreased awareness of deficits     General Comments: Pt pleasant and willing to participate in therapy. Improved attention to right side noted this date. Pt able to follow simple one step instructions. Slight perseveration with grooming tasks at the sink.       Exercises      General Comments        Pertinent Vitals/Pain Pain Assessment: No/denies pain Faces Pain Scale: No hurt Pain Intervention(s): Limited activity within patient's tolerance    Home Living                      Prior Function            PT Goals (current goals can now be found in the  care plan section) Acute Rehab PT Goals Patient Stated Goal: per family to return home Potential to Achieve Goals: Good Progress towards PT goals: Progressing toward goals    Frequency    Min 4X/week      PT Plan Current plan remains appropriate    Co-evaluation              AM-PAC PT "6 Clicks" Mobility   Outcome Measure  Help  needed turning from your back to your side while in a flat bed without using bedrails?: A Little Help needed moving from lying on your back to sitting on the side of a flat bed without using bedrails?: A Little Help needed moving to and from a bed to a chair (including a wheelchair)?: A Little Help needed standing up from a chair using your arms (e.g., wheelchair or bedside chair)?: A Little Help needed to walk in hospital room?: A Little Help needed climbing 3-5 steps with a railing? : A Little 6 Click Score: 18    End of Session Equipment Utilized During Treatment: Gait belt Activity Tolerance: Patient tolerated treatment well Patient left: in bed;with call bell/phone within reach;with family/visitor present;with bed alarm set Nurse Communication: Mobility status PT Visit Diagnosis: Other abnormalities of gait and mobility (R26.89);Other symptoms and signs involving the nervous system (R29.898)     Time: [id number] PT Time Calculation (min) (ACUTE ONLY): 26 min  Charges:  $Gait Training: 8-22 mins $Therapeutic Activity: 8-22 mins                     Erasmo Leventhal , PTA Acute Rehabilitation Services Pager [phone] Office [phone]     Emari Hreha Eli Hose 10/12/2019, 6:32 PM

## 2019-10-12 NOTE — Discharge Summary (Signed)
Physician Discharge Summary  [name] [name] [id number] DOB: [birth year] DOA: 10/08/2019  PCP: Lajean Manes, MD  Admit date: 10/08/2019 Discharge date: 10/12/2019  Admitted From:Home  Disposition:  HOME.   Recommendations for Outpatient Follow-up:  1. Follow up with PCP in 1-2 weeks 2. Please obtain BMP/CBC in one week 3. Please follow up with Neurology as recommended.  4. Please follow up with endocrinology in 1 to 2 weeks.   Home Health:yes  Discharge Condition: guarded.  CODE STATUS: full code.  Diet recommendation: dysphagia 3 diet with thin liquid.   Brief/Interim Summary: [name] [name] an 86 y.o.femaleWith PMHDM2, HTN, HLD, CKD who presented to [facility] ED as a code stroke for AMS, aphasia.  TPA administered on 10/08/2019 at 1013.  MRI of the brain did not show any acute infarct. Probably from prolonged post ictal state.   Discharge Diagnoses:  Principal Problem:   Seizures ([place]) with prolonged postictal period Active Problems:   Type 2 diabetes mellitus without complication, with long-term current use of insulin (HCC)   Essential hypertension   Hyperlipidemia   Hyperglycemia   Malnutrition ([place])  Likely seizure with prolonged post ictal state  Code Stroke CT head No acute abnormality. Small vessel disease. Atrophy. Sinus dz.   CTA head no LVO. L M2/MCA branch w/ mild to moderate origin stenosis. R M2/MCA moderate origin stenosis. R P2/PCA multifocal moderate stenosis. B ICA atherosclerosis w/ R ICA cavernous and paraclinoid sites mild to moderate stenoses. L ICA mild narrowing.  CTA neck B ICA mild plaque. R VA mild ostial stenosis.  Repeat CT head no acute abnormality. Small vessel disease. Atrophy.   MRI No acute infarct. Small vessel disease. Atrophy.  2D EchoEF 65-70%  EEGno sz, moderate to severe diffuse encephalopathy  LT EEGneg sz  LE dopplerno DVT  Loop interrogation requestedno A. fib  detected  LDL142  HgbA1c8.7  TSHnormal, FT41.48(h), thyroid antibodies(1 neg) 1 pending.   Started the patient on keppra as per neurology.   aspirin 81 mg daily and clopidogrel 75 mg dailyprior to admission, onaspirin 81 mg daily. Continue aspirin alone at d/c given sz and no stroke.   Therapy recommendations:CIR - rehab consultedbut no rehab supported dx for IP care -> HH PT and OT  Disposition: return home w/ family  According to [facility] law, pt can not drive until seizure free for 6 months and under physician's care.   Fever with AMS, resolved No signs of infection. Cultures have been  Negative so far.   Hx TIA vs.seizure ? 08/2019 - TIA vs. Seizure presenting with R arm weakness, with diaphoretic episode and passed out for 2 min. DAPT x 3 weeks. MRI and MRA neg. CUS neg. EF 60-65%. EEG neg. LDL 133 and A1C 8.6. Loop placed.  Hypertension BP at goal.   Hyperlipidemia  LDL 142, goal < 70 Continue statin at discharge  Diabetes type II Uncontrolled Resume home meds on discharge.  HgbA1c 8.7, goal < 7.0   Dysphagia SLP evaluation recommending dysphagia 3 diet.   Discharge Instructions  Discharge Instructions    Ambulatory referral to Neurology   Complete by: As directed    Follow up at [facility] with provider for seizures   Diet - low sodium heart healthy   Complete by: As directed    Discharge instructions   Complete by: As directed    According to [place] law, pt can not drive until seizure free for 6 months and under physician's care. Please  maintain seizure precautions. Do not participate in activities where a loss of awareness could hurt pt or someone else.  No swimming alone, no tub bathing, no hot tubs, no driving, no operating motorized vehicles(cars, ATVs, motorbikes, etc), lawnmowers or power tools.  No standing at heights, such as rooftops, ladders or stairs. For children, no sliding boards, monkey bars or swings, or climbing  trees.  Avoid hot objects such as stoves, heaters, open fires.  Wear a helmet when riding a bicycle, scooter, skateboard, etc. and avoid areas of traffic.  Set water heater to 120 degrees or less.   Discharge instructions   Complete by: As directed    Please follow up with Neurology as recommended.  Please follow up with Endocrinology in 1 to 2 weeks   Increase activity slowly   Complete by: As directed      Allergies as of 10/12/2019      Reactions   Sulfonamide Derivatives Hives   Bactrim [sulfamethoxazole-trimethoprim] Rash   Reported by [facility]   Lisinopril Cough   Reported by [facility]      Medication List    STOP taking these medications   clopidogrel 75 MG tablet Commonly known as: PLAVIX     TAKE these medications   acetaminophen 500 MG tablet Commonly known as: TYLENOL Take 500-1,000 mg by mouth at bedtime as needed for mild pain.   aspirin EC 81 MG tablet Take 81 mg by mouth daily.   atorvastatin 40 MG tablet Commonly known as: LIPITOR Take 40 mg by mouth daily at 6 PM.   B-D UF III MINI PEN NEEDLES 31G X 5 MM Misc Generic drug: Insulin Pen Needle USE AS DIRECTED TID   calcium-vitamin D 250-100 MG-UNIT tablet Take 1 tablet by mouth daily.   cetirizine 10 MG tablet Commonly known as: ZYRTEC Take 10 mg by mouth daily as needed for allergies.   cholecalciferol 1000 units tablet Commonly known as: VITAMIN D Take 1,000 Units by mouth daily.   Fish Oil 1000 MG Caps Take 1,000 mg by mouth 2 (two) times daily.   Fluad 0.5 ML Susy Generic drug: Influenza Vac A&B Surf Ant Adj ADM 0.5ML IM UTD   Fusion Plus Caps Take 1 capsule by mouth daily.   Levemir FlexTouch 100 UNIT/ML FlexPen Generic drug: insulin detemir Inject 4-6 Units into the skin See admin instructions. Inject 6 units in the morning and 4 units at bedtime.   levETIRAcetam 500 MG tablet Commonly known as: KEPPRA Take 1 tablet (500 mg total) by mouth 2 (two) times daily.    losartan 50 MG tablet Commonly known as: COZAAR Take 50 mg by mouth daily.   multivitamin with minerals Tabs tablet Take 1 tablet by mouth daily.   NovoLOG 100 UNIT/ML injection Generic drug: insulin aspart Inject 4-12 Units into the skin See admin instructions. Inject 10-12 units at breakfast, 7-9 units at lunch and 4-6 units at dinner.   nystatin 100000 UNIT/ML suspension Commonly known as: MYCOSTATIN Take 4 mLs by mouth at bedtime as needed.   pantoprazole 40 MG tablet Commonly known as: PROTONIX Take 40 mg by mouth daily.   polyethylene glycol 17 g packet Commonly known as: MIRALAX / GLYCOLAX Take 17 g by mouth daily as needed for mild constipation.   Synthroid 137 MCG tablet Generic drug: levothyroxine Take 137 mcg by mouth every morning.            Durable Medical Equipment  (From admission, onward)  Start     Ordered   10/11/19 1436  For home use only DME 3 n 1  Once     10/11/19 1435         Follow-up Information    [facility] Follow up in 4 week(s).   Specialty: Neurology Why: Office will call with appt date and time Contact information: [place], [place] Follow up in 2 week(s).   Specialty: Internal Medicine Contact information: [address] [address] [phone]        [facility]   Contact information: [place] [address] [phone]        [facility] Follow up.   Why: The home health agency will contact you for the first home visit. Contact information: [phone]         Allergies  Allergen Reactions  . Sulfonamide Derivatives Hives  . Bactrim [Sulfamethoxazole-Trimethoprim] Rash    Reported by [facility]  . Lisinopril Cough    Reported by [facility]    Consultations:  Neurology.    Procedures/Studies: CT Code Stroke CTA Head W/WO  contrast  Result Date: 10/08/2019 CLINICAL DATA:  Aphasia. EXAM: CT ANGIOGRAPHY HEAD AND NECK TECHNIQUE: Multidetector CT imaging of the head and neck was performed using the standard protocol during bolus administration of intravenous contrast. Multiplanar CT image reconstructions and MIPs were obtained to evaluate the vascular anatomy. Carotid stenosis measurements (when applicable) are obtained utilizing NASCET criteria, using the distal internal carotid diameter as the denominator. CONTRAST:  Administered contrast not known at this time COMPARISON:  Noncontrast head CT 10/08/2019, MRI/MRA head 08/21/2019. FINDINGS: CTA NECK FINDINGS Aortic arch: Standard aortic branching. Calcified atherosclerotic plaque within the visualized aortic arch and proximal major branch vessels of the neck. No significant innominate or proximal subclavian artery stenosis. Right carotid system: See seen ICA patent within the neck without stenosis. Minimal calcified plaque within the carotid bifurcation. Left carotid system: CCA and ICA patent within the neck without stenosis. Mild calcified plaque within the carotid bifurcation and proximal ICA. Vertebral arteries: The vertebral arteries are codominant and patent within the neck bilaterally. Calcified plaque at the origin the right vertebral artery with resultant mild ostial stenosis. Skeleton: Cervical spondylosis. Most notably there is advanced disc height loss with prominent degenerative endplate irregularity and degenerative endplate sclerosis at D34-534 and T1-T2. Other neck: No neck mass or cervical lymphadenopathy. Upper chest: No consolidation within the imaged lung apices. Review of the MIP images confirms the above findings CTA HEAD FINDINGS Anterior circulation: The intracranial internal carotid arteries are patent bilaterally with scattered calcified plaque. Sites of mild to moderate stenosis within the cavernous and paraclinoid right ICA. No more than mild narrowing of the  left ICA. The M1 middle cerebral arteries are patent without significant stenosis. Mild-to-moderate stenosis at the origin of a right M2 MCA branch (series 7, image. 99). Moderate stenosis at the origin of a right M2 MCA branch vessel (series 7, image 101). The anterior cerebral arteries are patent without high-grade proximal stenosis. No intracranial aneurysm is identified. Posterior circulation: The intracranial vertebral arteries are patent without significant stenosis, as is the basilar artery. The posterior cerebral arteries are patent bilaterally. Multifocal sites of moderate stenosis within the P2 right posterior cerebral artery. Posterior communicating arteries are poorly delineated and may be hypoplastic or absent bilaterally. Venous sinuses: Within limitations of contrast timing,  no convincing thrombus. Anatomic variants: As described Review of the MIP images confirms the above findings No emergent large vessel occlusion identified. These results were called by telephone at the time of interpretation on 10/08/2019 at 10:30 am to provider [facility] , who verbally acknowledged these results. IMPRESSION: CTA neck: 1. The bilateral common and internal carotid arteries are patent within the neck without significant stenosis. Mild plaque in the bilateral carotid systems as described. 2. The vertebral arteries are patent within the neck bilaterally. Calcified plaque results in mild ostial stenosis on the right. CTA head: 1. No intracranial large vessel occlusion. 2. Mild-to-moderate stenosis at the origin of a left M2 MCA branch vessel. 3. Moderate stenosis at the origin of a right M2 MCA branch vessel. 4. Multifocal moderate stenoses within the P2 right posterior cerebral artery. 5. Atherosclerotic calcifications within the intracranial internal carotid arteries. Sites of mild to moderate stenosis within the cavernous and paraclinoid right ICA. No more than mild narrowing of the left ICA. Electronically  Signed   By: Kellie Simmering DO   On: 10/08/2019 10:41   CT HEAD WO CONTRAST  Result Date: 10/08/2019 CLINICAL DATA:  Post tPA stroke, follow-up. EXAM: CT HEAD WITHOUT CONTRAST TECHNIQUE: Contiguous axial images were obtained from the base of the skull through the vertex without intravenous contrast. COMPARISON:  Head CT 10/08/2019 FINDINGS: Brain: There is residual circulating contrast material. There is no evidence of acute intracranial hemorrhage, intracranial mass, midline shift or extra-axial fluid collection.No demarcated cortical infarction. Stable generalized parenchymal atrophy and chronic small vessel ischemic disease. Vascular: Circulating contrast material precludes evaluation for hyperdense vessels. Skull: Normal. Negative for fracture or focal lesion. Sinuses/Orbits: Visualized orbits demonstrate no acute abnormality. No significant paranasal sinus disease or mastoid effusion at the imaged levels. IMPRESSION: No evidence of acute intracranial abnormality. Stable generalized parenchymal atrophy and chronic small vessel ischemic disease. Electronically Signed   By: Kellie Simmering DO   On: 10/08/2019 14:32   CT Code Stroke CTA Neck W/WO contrast  Result Date: 10/08/2019 CLINICAL DATA:  Aphasia. EXAM: CT ANGIOGRAPHY HEAD AND NECK TECHNIQUE: Multidetector CT imaging of the head and neck was performed using the standard protocol during bolus administration of intravenous contrast. Multiplanar CT image reconstructions and MIPs were obtained to evaluate the vascular anatomy. Carotid stenosis measurements (when applicable) are obtained utilizing NASCET criteria, using the distal internal carotid diameter as the denominator. CONTRAST:  Administered contrast not known at this time COMPARISON:  Noncontrast head CT 10/08/2019, MRI/MRA head 08/21/2019. FINDINGS: CTA NECK FINDINGS Aortic arch: Standard aortic branching. Calcified atherosclerotic plaque within the visualized aortic arch and proximal major branch  vessels of the neck. No significant innominate or proximal subclavian artery stenosis. Right carotid system: See seen ICA patent within the neck without stenosis. Minimal calcified plaque within the carotid bifurcation. Left carotid system: CCA and ICA patent within the neck without stenosis. Mild calcified plaque within the carotid bifurcation and proximal ICA. Vertebral arteries: The vertebral arteries are codominant and patent within the neck bilaterally. Calcified plaque at the origin the right vertebral artery with resultant mild ostial stenosis. Skeleton: Cervical spondylosis. Most notably there is advanced disc height loss with prominent degenerative endplate irregularity and degenerative endplate sclerosis at D34-534 and T1-T2. Other neck: No neck mass or cervical lymphadenopathy. Upper chest: No consolidation within the imaged lung apices. Review of the MIP images confirms the above findings CTA HEAD FINDINGS Anterior circulation: The intracranial internal carotid arteries are patent bilaterally with scattered calcified plaque. Sites  of mild to moderate stenosis within the cavernous and paraclinoid right ICA. No more than mild narrowing of the left ICA. The M1 middle cerebral arteries are patent without significant stenosis. Mild-to-moderate stenosis at the origin of a right M2 MCA branch (series 7, image. 99). Moderate stenosis at the origin of a right M2 MCA branch vessel (series 7, image 101). The anterior cerebral arteries are patent without high-grade proximal stenosis. No intracranial aneurysm is identified. Posterior circulation: The intracranial vertebral arteries are patent without significant stenosis, as is the basilar artery. The posterior cerebral arteries are patent bilaterally. Multifocal sites of moderate stenosis within the P2 right posterior cerebral artery. Posterior communicating arteries are poorly delineated and may be hypoplastic or absent bilaterally. Venous sinuses: Within limitations  of contrast timing, no convincing thrombus. Anatomic variants: As described Review of the MIP images confirms the above findings No emergent large vessel occlusion identified. These results were called by telephone at the time of interpretation on 10/08/2019 at 10:30 am to provider [facility] , who verbally acknowledged these results. IMPRESSION: CTA neck: 1. The bilateral common and internal carotid arteries are patent within the neck without significant stenosis. Mild plaque in the bilateral carotid systems as described. 2. The vertebral arteries are patent within the neck bilaterally. Calcified plaque results in mild ostial stenosis on the right. CTA head: 1. No intracranial large vessel occlusion. 2. Mild-to-moderate stenosis at the origin of a left M2 MCA branch vessel. 3. Moderate stenosis at the origin of a right M2 MCA branch vessel. 4. Multifocal moderate stenoses within the P2 right posterior cerebral artery. 5. Atherosclerotic calcifications within the intracranial internal carotid arteries. Sites of mild to moderate stenosis within the cavernous and paraclinoid right ICA. No more than mild narrowing of the left ICA. Electronically Signed   By: Kellie Simmering DO   On: 10/08/2019 10:41   MR BRAIN WO CONTRAST  Result Date: 10/09/2019 CLINICAL DATA:  Stroke symptoms.  Aphasia.  23 hours post tPA. EXAM: MRI HEAD WITHOUT CONTRAST TECHNIQUE: Multiplanar, multiecho pulse sequences of the brain and surrounding structures were obtained without intravenous contrast. COMPARISON:  CT head 10/08/2019 FINDINGS: Brain: Negative for acute infarct. Generalized atrophy. Mild chronic white matter changes bilaterally. Brainstem intact. Small chronic infarct left cerebellum. Negative for hemorrhage or mass. Vascular: Normal arterial flow voids. Skull and upper cervical spine: No focal skeletal lesion identified. Sinuses/Orbits: Mild mucosal edema paranasal sinuses. Bilateral cataract extraction. Other: None  IMPRESSION: Negative for acute infarct. Atrophy with mild chronic microvascular ischemic change in the white matter. Electronically Signed   By: Franchot Gallo M.D.   On: 10/09/2019 10:20   US ARTERIAL SEG MULTIPLE LE (ABI, SEGMENTAL PRESSURES, PVR'S)  Result Date: 09/15/2019 CLINICAL DATA:  Peripheral vascular disease, hypertension, diabetes, bilateral lower extremity claudication EXAM: NONINVASIVE PHYSIOLOGIC VASCULAR STUDY OF BILATERAL LOWER EXTREMITIES TECHNIQUE: Non-invasive vascular evaluation of both lower extremities was performed at rest, including calculation of ankle-brachial indices, multiple segmental pressure evaluation, segmental Doppler and segmental pulse volume recording. COMPARISON:  09/26/2018 FINDINGS: Right Lower Extremity Resting ABI:  0.97 Resting TBI: 0.45 Segmental Pressures: Predominantly infrapopliteal occlusive disease. Great toe pressure: 58 mmHg Arterial Waveforms: Multiphasic through the popliteal artery, monophasic distally. PVRs: Normal PVRs with maintained waveform amplitude, augmentation and quality. Left Lower Extremity: Resting ABI: 0.66 Resting TBI: 0.31 Segmental Pressures: Popliteal and infrapopliteal occlusive components. Great toe pressure: 40 mmHg Arterial Waveforms: Monophasic throughout. PVRs: Normal PVRs with maintained waveform amplitude, augmentation and quality. Other: Symmetric upper extremity pressures. Ankle Brachial  index > 1.4 Non diagnostic secondary to incompressible vessel calcifications 1.0-1.4       Normal 0.9-0.99     Borderline PAD 0.8-0.89     Mild PAD 0.5-0.79     Moderate PAD < 0.5          Severe PAD Toe Brachial Index Normal     >0.65 Moderate  0.53-0.64 Severe     <0.23 Toe Pressures Absolute toe pressure >32mmHg sufficient for wound healing. Toe pressures <8mmHg = critical limb ischemia. IMPRESSION: 1. Moderate right infrapopliteal arterial occlusive disease. 2. Moderate multi-segmental left lower extremity arterial occlusive disease. Recorded  toe pressure consistent with critical limb ischemia. Electronically Signed   By: Lucrezia Europe M.D.   On: 09/15/2019 15:42   DG Chest Portable 1 View  Result Date: 10/08/2019 CLINICAL DATA:  Acute mental status change EXAM: PORTABLE CHEST 1 VIEW COMPARISON:  August 17, 2019 FINDINGS: The heart size and mediastinal contours are within normal limits. Both lungs are clear. The visualized skeletal structures are unremarkable. IMPRESSION: No active disease. Electronically Signed   By: Dorise Bullion III M.D   On: 10/08/2019 10:42   EEG adult  Result Date: 10/09/2019 Lora Havens, MD     10/09/2019 12:26 PM Patient Name: [name] [name] MRN: [id number] Epilepsy Attending: Lora Havens Referring Physician/Provider: Dr. Rosalin Hawking Date: 10/09/2019 Duration: 22.22 minutes Patient history: 86 year old female who presented as a code of stroke for altered mental status and aphasia.  MRI brain did not show acute stroke.  EEG evaluate for seizures. Level of alertness: Lethargic AEDs during EEG study: None Technical aspects: This EEG study was done with scalp electrodes positioned according to the 10-20 International system of electrode placement. Electrical activity was acquired at a sampling rate of 500Hz  and reviewed with a high frequency filter of 70Hz  and a low frequency filter of 1Hz . EEG data were recorded continuously and digitally stored. Description: No clear posterior rhythm was seen.  EEG showed condyle generalized low amplitude 2 to 3 Hz delta slowing.  Intermittent triphasic waves, generalized, maximal bifrontal were also noted.  Hyperventilation and photic stimulation were not performed. Abnormality -Continuous slow, generalized -Triphasic waves, generalized IMPRESSION: This study is suggestive of moderate to severe diffuse encephalopathy, nonspecific etiology could be secondary toxic-metabolic causes.  No seizures or epileptiform discharges were seen throughout the recording. Priyanka Barbra Sarks    Overnight EEG with video  Result Date: 10/11/2019 Lora Havens, MD     10/11/2019  2:29 PM Patient Name: [name] [name] MRN: [id number] Epilepsy Attending: Lora Havens Referring Physician/Provider: Dr. Rosalin Hawking Duration: 10/10/2019 [id number] to 10/11/2019 [id number]  Patient history: 86 year old female who presented as a code of stroke for altered mental status and aphasia.  MRI brain did not show acute stroke.  EEG evaluate for seizures.  Level of alertness: Lethargic, asleep  AEDs during EEG study: None  Technical aspects: This EEG study was done with scalp electrodes positioned according to the 10-20 International system of electrode placement. Electrical activity was acquired at a sampling rate of 500Hz  and reviewed with a high frequency filter of 70Hz  and a low frequency filter of 1Hz . EEG data were recorded continuously and digitally stored. Description: The posterior dominant rhythm consists of 7-8 Hz activity of moderate voltage (25-35 uV) seen predominantly in posterior head regions, symmetric and reactive to eye opening and eye closing. Sleep was characterized by vertex waves, sleep spindles (12-14hz ), maximal frontocentral. EEG also showed continuous generalized low amplitude 5-7Hz  theta  slowing.  Hyperventilation and photic stimulation were not performed.  Abnormality -Continuous slow, generalized  IMPRESSION: This study is suggestive of mild diffuse encephalopathy, nonspecific etiology.  No seizures or epileptiform discharges were seen throughout the recording. EEG appears improved compared to previous day.  Lora Havens   ECHOCARDIOGRAM COMPLETE  Result Date: 10/09/2019    ECHOCARDIOGRAM REPORT   Patient Name:   [name] [name] Date of Exam: 10/09/2019 Medical Rec #:  [id number]       Height:       65.0 in Accession #:    [id number]      Weight:       138.4 lb Date of Birth:  [birth year]       BSA:          1.692 m Patient Age:    4 years        BP:           115/77 mmHg Patient  Gender: F               HR:           88 bpm. Exam Location:  Inpatient Procedure: 2D Echo, Cardiac Doppler and Color Doppler Indications:    Stroke 434.91 / I163.9  History:        Patient has prior history of Echocardiogram examinations, most                 recent 08/22/2019. Risk Factors:Hypertension, Diabetes,                 Dyslipidemia and GERD. CKD. Hypothyroidism.  Sonographer:    Jonelle Sidle Dance Referring Phys: [id number] [place]  1. Left ventricular ejection fraction, by estimation, is 65 to 70%. The left ventricle has normal function. The left ventricle has no regional wall motion abnormalities. Left ventricular diastolic parameters are consistent with Grade I diastolic dysfunction (impaired relaxation).  2. Right ventricular systolic function is normal. The right ventricular size is normal. There is mildly elevated pulmonary artery systolic pressure.  3. The mitral valve is abnormal. Mild mitral valve regurgitation.  4. The aortic valve is tricuspid. Aortic valve regurgitation is trivial. Mild aortic valve sclerosis is present, with no evidence of aortic valve stenosis. Comparison(s): Changes from prior study are noted. 08/22/19: LVEF 60-65%. FINDINGS  Left Ventricle: Left ventricular ejection fraction, by estimation, is 65 to 70%. The left ventricle has normal function. The left ventricle has no regional wall motion abnormalities. The left ventricular internal cavity size was normal in size. There is  no left ventricular hypertrophy. Left ventricular diastolic parameters are consistent with Grade I diastolic dysfunction (impaired relaxation). Indeterminate filling pressures. Right Ventricle: The right ventricular size is normal. No increase in right ventricular wall thickness. Right ventricular systolic function is normal. There is mildly elevated pulmonary artery systolic pressure. The tricuspid regurgitant velocity is 3.03  m/s, and with an assumed right atrial pressure of 3 mmHg, the  estimated right ventricular systolic pressure is [id number]. Left Atrium: Left atrial size was normal in size. Right Atrium: Right atrial size was normal in size. Pericardium: There is no evidence of pericardial effusion. Mitral Valve: The mitral valve is abnormal. There is mild thickening of the mitral valve leaflet(s). Mild mitral valve regurgitation. Tricuspid Valve: The tricuspid valve is grossly normal. Tricuspid valve regurgitation is trivial. Aortic Valve: The aortic valve is tricuspid. Aortic valve regurgitation is trivial. Mild aortic valve sclerosis is present, with no evidence of aortic valve stenosis. Pulmonic  Valve: The pulmonic valve was grossly normal. Pulmonic valve regurgitation is trivial. Aorta: The aortic root and ascending aorta are structurally normal, with no evidence of dilitation. IAS/Shunts: No atrial level shunt detected by color flow Doppler.  LEFT VENTRICLE PLAX 2D LVIDd:         3.78 cm  Diastology LVIDs:         2.45 cm  LV e' lateral:   8.24 cm/s LV PW:         0.84 cm  LV E/e' lateral: 12.1 LV IVS:        0.82 cm  LV e' medial:    7.15 cm/s LVOT diam:     1.90 cm  LV E/e' medial:  13.9 LV SV:         72 LV SV Index:   43 LVOT Area:     2.84 cm  RIGHT VENTRICLE             IVC RV Basal diam:  3.17 cm     IVC diam: 1.75 cm RV Mid diam:    2.12 cm RV S prime:     16.80 cm/s TAPSE (M-mode): 2.5 cm LEFT ATRIUM             Index       RIGHT ATRIUM           Index LA diam:        2.90 cm 1.71 cm/m  RA Area:     13.00 cm LA Vol (A2C):   47.2 ml 27.90 ml/m RA Volume:   31.90 ml  18.85 ml/m LA Vol (A4C):   37.3 ml 22.05 ml/m LA Biplane Vol: 42.0 ml 24.82 ml/m  AORTIC VALVE LVOT Vmax:   122.00 cm/s LVOT Vmean:  72.900 cm/s LVOT VTI:    0.255 m  AORTA Ao Root diam: 3.10 cm Ao Asc diam:  2.60 cm MITRAL VALVE                 TRICUSPID VALVE MV Area (PHT): 3.31 cm      TR Peak grad:   36.7 mmHg MV Decel Time: 229 msec      TR Vmax:        303.00 cm/s MR Peak grad:    193.8 mmHg MR Mean grad:     126.0 mmHg  SHUNTS MR Vmax:         696.00 cm/s Systemic VTI:  0.26 m MR Vmean:        528.0 cm/s  Systemic Diam: 1.90 cm MR PISA:         0.57 cm MR PISA Eff ROA: 3 mm MR PISA Radius:  0.30 cm MV E velocity: 99.40 cm/s MV A velocity: 117.00 cm/s MV E/A ratio:  0.85 Lyman Bishop MD Electronically signed by Lyman Bishop MD Signature Date/Time: 10/09/2019/11:03:32 AM    Final    CUP PACEART REMOTE DEVICE CHECK  Result Date: 09/25/2019 [facility] summary report received. Battery status OK. Normal device function. No new symptom episodes, tachy episodes, brady, or pause episodes. No new AF episodes. Monthly summary reports and ROV/PRN    [facility]  CT HEAD CODE STROKE WO CONTRAST  Result Date: 10/08/2019 CLINICAL DATA:  Code stroke. Focal neuro deficit, greater than 6 hours, stroke suspected. Additional history provided: Aphasia. EXAM: CT HEAD WITHOUT CONTRAST TECHNIQUE: Contiguous axial images were obtained from the base of the skull through the vertex without intravenous contrast. COMPARISON:  MRI/MRA head 09/10/2019, head CT 08/21/2019 FINDINGS: Brain: The examination  is mildly motion degraded. There is no evidence of acute intracranial hemorrhage, intracranial mass, midline shift or extra-axial fluid collection.No demarcated cortical infarction. Ill-defined hypoattenuation within the cerebral white matter is nonspecific, but consistent with chronic small vessel ischemic disease. Stable, mild generalized parenchymal atrophy. Vascular: No hyperdense vessel.  Atherosclerotic calcifications. Skull: Normal. Negative for fracture or focal lesion. Sinuses/Orbits: Visualized orbits demonstrate no acute abnormality. Mild ethmoid sinus mucosal thickening. Trace fluid within left mastoid air cells. IMPRESSION: Mildly motion degraded exam. No evidence of acute intracranial abnormality. Generalized parenchymal atrophy and chronic small vessel ischemic disease. Mild ethmoid sinus mucosal thickening. Trace fluid within  left mastoid air cells. Electronically Signed   By: Kellie Simmering DO   On: 10/08/2019 10:00   VAS [place] LOWER EXTREMITY VENOUS (DVT)  Result Date: 10/09/2019  Lower Venous DVTStudy Indications: Stroke.  Limitations: Patient movement. Comparison Study: no prior Performing Technologist: Abram Sander RVS  Examination Guidelines: A complete evaluation includes B-mode imaging, spectral Doppler, color Doppler, and power Doppler as needed of all accessible portions of each vessel. Bilateral testing is considered an integral part of a complete examination. Limited examinations for reoccurring indications may be performed as noted. The reflux portion of the exam is performed with the patient in reverse Trendelenburg.  +---------+---------------+---------+-----------+----------+--------------+ RIGHT    CompressibilityPhasicitySpontaneityPropertiesThrombus Aging +---------+---------------+---------+-----------+----------+--------------+ CFV      Full           Yes      Yes                                 +---------+---------------+---------+-----------+----------+--------------+ SFJ      Full                                                        +---------+---------------+---------+-----------+----------+--------------+ FV Prox  Full                                                        +---------+---------------+---------+-----------+----------+--------------+ FV Mid   Full                                                        +---------+---------------+---------+-----------+----------+--------------+ FV DistalFull                                                        +---------+---------------+---------+-----------+----------+--------------+ PFV      Full                                                        +---------+---------------+---------+-----------+----------+--------------+ POP      Full  Yes      Yes                                  +---------+---------------+---------+-----------+----------+--------------+ PTV      Full                                                        +---------+---------------+---------+-----------+----------+--------------+ PERO     Full                                                        +---------+---------------+---------+-----------+----------+--------------+   +---------+---------------+---------+-----------+----------+--------------+ LEFT     CompressibilityPhasicitySpontaneityPropertiesThrombus Aging +---------+---------------+---------+-----------+----------+--------------+ CFV      Full           Yes      Yes                                 +---------+---------------+---------+-----------+----------+--------------+ SFJ      Full                                                        +---------+---------------+---------+-----------+----------+--------------+ FV Prox  Full                                                        +---------+---------------+---------+-----------+----------+--------------+ FV Mid   Full                                                        +---------+---------------+---------+-----------+----------+--------------+ FV DistalFull                                                        +---------+---------------+---------+-----------+----------+--------------+ PFV      Full                                                        +---------+---------------+---------+-----------+----------+--------------+ POP      Full           Yes      Yes                                 +---------+---------------+---------+-----------+----------+--------------+ PTV  Full                                                        +---------+---------------+---------+-----------+----------+--------------+ PERO                                                  Not visualized  +---------+---------------+---------+-----------+----------+--------------+     Summary: BILATERAL: - No evidence of deep vein thrombosis seen in the lower extremities, bilaterally.   *See table(s) above for measurements and observations. Electronically signed by Curt Jews MD on 10/09/2019 at 3:35:20 PM.    Final        Subjective: No new complaints, wants to go home today.   Discharge Exam: Vitals:   10/12/19 0742 10/12/19 1005  BP: (!) 131/47 (!) 126/48  Pulse: 66 64  Resp: 20 18  Temp: 98.3 F (36.8 C) 97.9 F (36.6 C)  SpO2: 99% 99%   Vitals:   10/11/19 2355 10/12/19 0427 10/12/19 0742 10/12/19 1005  BP: (!) 145/50 (!) 153/63 (!) 131/47 (!) 126/48  Pulse: 76 74 66 64  Resp: 19 19 20 18   Temp: 98.5 F (36.9 C) 98.2 F (36.8 C) 98.3 F (36.8 C) 97.9 F (36.6 C)  TempSrc: Oral Oral Oral   SpO2: 100% 100% 99% 99%  Weight:      Height:        General: Pt is alert, awake, not in acute distress Cardiovascular: RRR, S1/S2 +, no rubs, no gallops Respiratory: CTA bilaterally, no wheezing, no rhonchi Abdominal: Soft, NT, ND, bowel sounds + Extremities: no edema, no cyanosis    The results of significant diagnostics from this hospitalization (including imaging, microbiology, ancillary and laboratory) are listed below for reference.     Microbiology: Recent Results (from the past 240 hour(s))  MRSA PCR Screening     Status: None   Collection Time: 10/08/19 11:41 AM   Specimen: Nasopharyngeal  Result Value Ref Range Status   MRSA by PCR NEGATIVE NEGATIVE Final    Comment:        The GeneXpert MRSA Assay (FDA approved for NASAL specimens only), is one component of a comprehensive MRSA colonization surveillance program. It is not intended to diagnose MRSA infection nor to guide or monitor treatment for MRSA infections. Performed at [facility], [place] [address], [place], [place] [address]   Culture, blood (Routine X 2) w Reflex to ID Panel     Status: None  (Preliminary result)   Collection Time: 10/09/19  3:01 PM   Specimen: BLOOD LEFT HAND  Result Value Ref Range Status   Specimen Description BLOOD LEFT HAND  Final   Special Requests   Final    BOTTLES DRAWN AEROBIC ONLY Blood Culture results may not be optimal due to an inadequate volume of blood received in culture bottles   Culture   Final    NO GROWTH 3 DAYS Performed at [facility], [place] [address] [place], [place] [address]    Report Status PENDING  Incomplete  Culture, blood (Routine X 2) w Reflex to ID Panel     Status: None (Preliminary result)   Collection Time: 10/09/19  3:03 PM  Specimen: BLOOD LEFT HAND  Result Value Ref Range Status   Specimen Description BLOOD LEFT HAND  Final   Special Requests   Final    BOTTLES DRAWN AEROBIC ONLY Blood Culture adequate volume   Culture   Final    NO GROWTH 3 DAYS Performed at [facility], [address], [place], [place] [address]    Report Status PENDING  Incomplete     Labs: BNP (last 3 results) No results for input(s): BNP in the last 8760 hours. Basic Metabolic Panel: Recent Labs  Lab 10/08/19 0937 10/08/19 [id number] 10/08/19 0942 10/08/19 0942 10/09/19 0309 10/09/19 1503 10/09/19 1628 10/10/19 0458 10/11/19 0448 10/11/19 1152 10/12/19 0236  NA 127*   < > 126*  --  133*  --   --  129* 131*  --  131*  K 4.1   < > 4.1  --  4.6  --   --  4.1 4.1  --  4.7  CL 93*   < > 93*  --  97*  --   --  95* 97*  --  97*  CO2 24  --   --   --  17*  --   --  24 25  --  26  GLUCOSE 170*   < > 159*   < > 179*  --   --  189* 124* 447* 153*  BUN 18   < > 22  --  19  --   --  17 20  --  21  CREATININE 1.26*   < > 1.20*  --  1.27*  --   --  1.20* 1.10*  --  1.10*  CALCIUM 9.9  --   --   --  9.7  --   --  8.9 9.0  --  9.2  MG  --   --   --   --   --  1.7 1.7 1.8  --   --   --   PHOS  --   --   --   --  3.5 3.2 3.2 2.7  --   --   --    < > = values in this interval not displayed.   Liver Function Tests: Recent Labs  Lab  10/08/19 0937 10/09/19 0309  AST 35  --   ALT 32  --   ALKPHOS 66  --   BILITOT 0.7  --   PROT 6.8  --   ALBUMIN 3.6 3.5   No results for input(s): LIPASE, AMYLASE in the last 168 hours. Recent Labs  Lab 10/09/19 1503  AMMONIA 24   CBC: Recent Labs  Lab 10/08/19 0937 10/08/19 0937 10/08/19 0942 10/09/19 0309 10/10/19 0458 10/11/19 0448 10/12/19 0236  WBC 5.1  --   --  9.7 8.6 7.8 9.2  NEUTROABS 2.1  --   --   --   --   --   --   HGB 11.5*   < > 11.6* 10.7* 9.6* 10.3* 9.6*  HCT 33.8*   < > 34.0* 31.2* 27.8* 29.5* 27.6*  MCV 94.4  --   --  95.4 92.4 93.7 93.9  PLT 256  --   --  274 256 271 251   < > = values in this interval not displayed.   Cardiac Enzymes: No results for input(s): CKTOTAL, CKMB, CKMBINDEX, TROPONINI in the last 168 hours. BNP: Invalid input(s): POCBNP CBG: Recent Labs  Lab 10/11/19 1750 10/11/19 2017 10/12/19 0000 10/12/19 [id number] 10/12/19  0905  GLUCAP 199* 118* 153* 132* 146*   D-Dimer No results for input(s): DDIMER in the last 72 hours. Hgb A1c No results for input(s): HGBA1C in the last 72 hours. Lipid Profile No results for input(s): CHOL, HDL, LDLCALC, TRIG, CHOLHDL, LDLDIRECT in the last 72 hours. Thyroid function studies Recent Labs    10/09/19 1503  TSH 1.303   Anemia work up No results for input(s): VITAMINB12, FOLATE, FERRITIN, TIBC, IRON, RETICCTPCT in the last 72 hours. Urinalysis    Component Value Date/Time   COLORURINE YELLOW 10/09/2019 2100   APPEARANCEUR CLEAR 10/09/2019 2100   LABSPEC 1.016 10/09/2019 2100   PHURINE 5.0 10/09/2019 2100   GLUCOSEU >=500 (A) 10/09/2019 2100   HGBUR NEGATIVE 10/09/2019 2100   BILIRUBINUR NEGATIVE 10/09/2019 2100   BILIRUBINUR neg 05/06/2015 1421   KETONESUR 5 (A) 10/09/2019 2100   PROTEINUR 100 (A) 10/09/2019 2100   UROBILINOGEN 1.0 05/06/2015 1421   UROBILINOGEN 0.2 12/25/2014 1206   NITRITE NEGATIVE 10/09/2019 2100   LEUKOCYTESUR NEGATIVE 10/09/2019 2100   Sepsis  Labs Invalid input(s): PROCALCITONIN,  WBC,  LACTICIDVEN Microbiology Recent Results (from the past 240 hour(s))  MRSA PCR Screening     Status: None   Collection Time: 10/08/19 11:41 AM   Specimen: Nasopharyngeal  Result Value Ref Range Status   MRSA by PCR NEGATIVE NEGATIVE Final    Comment:        The GeneXpert MRSA Assay (FDA approved for NASAL specimens only), is one component of a comprehensive MRSA colonization surveillance program. It is not intended to diagnose MRSA infection nor to guide or monitor treatment for MRSA infections. Performed at [facility], [place] [address], [place], [place] [address]   Culture, blood (Routine X 2) w Reflex to ID Panel     Status: None (Preliminary result)   Collection Time: 10/09/19  3:01 PM   Specimen: BLOOD LEFT HAND  Result Value Ref Range Status   Specimen Description BLOOD LEFT HAND  Final   Special Requests   Final    BOTTLES DRAWN AEROBIC ONLY Blood Culture results may not be optimal due to an inadequate volume of blood received in culture bottles   Culture   Final    NO GROWTH 3 DAYS Performed at [facility], [place] [address], [place], [place] [address]    Report Status PENDING  Incomplete  Culture, blood (Routine X 2) w Reflex to ID Panel     Status: None (Preliminary result)   Collection Time: 10/09/19  3:03 PM   Specimen: BLOOD LEFT HAND  Result Value Ref Range Status   Specimen Description BLOOD LEFT HAND  Final   Special Requests   Final    BOTTLES DRAWN AEROBIC ONLY Blood Culture adequate volume   Culture   Final    NO GROWTH 3 DAYS Performed at [facility], [place] [address] [place], [place] [address]    Report Status PENDING  Incomplete     Time coordinating discharge: 31 minutes  SIGNED:   Hosie Poisson, MD  [facility] 10/12/2019, 10:10 AM

## 2019-10-13 ENCOUNTER — Ambulatory Visit: Payer: Medicare HMO | Admitting: Allergy

## 2019-10-14 LAB — CULTURE, BLOOD (ROUTINE X 2)
Culture: NO GROWTH
Culture: NO GROWTH
Special Requests: ADEQUATE

## 2019-10-17 DIAGNOSIS — E039 Hypothyroidism, unspecified: Secondary | ICD-10-CM | POA: Diagnosis not present

## 2019-10-17 DIAGNOSIS — Z794 Long term (current) use of insulin: Secondary | ICD-10-CM | POA: Diagnosis not present

## 2019-10-17 DIAGNOSIS — E1140 Type 2 diabetes mellitus with diabetic neuropathy, unspecified: Secondary | ICD-10-CM | POA: Diagnosis not present

## 2019-10-17 DIAGNOSIS — G3184 Mild cognitive impairment, so stated: Secondary | ICD-10-CM | POA: Diagnosis not present

## 2019-10-17 DIAGNOSIS — E1165 Type 2 diabetes mellitus with hyperglycemia: Secondary | ICD-10-CM | POA: Diagnosis not present

## 2019-10-17 DIAGNOSIS — R569 Unspecified convulsions: Secondary | ICD-10-CM | POA: Diagnosis not present

## 2019-10-17 DIAGNOSIS — E11630 Type 2 diabetes mellitus with periodontal disease: Secondary | ICD-10-CM | POA: Diagnosis not present

## 2019-10-17 DIAGNOSIS — G8929 Other chronic pain: Secondary | ICD-10-CM | POA: Diagnosis not present

## 2019-10-17 DIAGNOSIS — E785 Hyperlipidemia, unspecified: Secondary | ICD-10-CM | POA: Diagnosis not present

## 2019-10-17 DIAGNOSIS — R69 Illness, unspecified: Secondary | ICD-10-CM | POA: Diagnosis not present

## 2019-10-18 DIAGNOSIS — E1121 Type 2 diabetes mellitus with diabetic nephropathy: Secondary | ICD-10-CM | POA: Diagnosis not present

## 2019-10-18 DIAGNOSIS — M810 Age-related osteoporosis without current pathological fracture: Secondary | ICD-10-CM | POA: Diagnosis not present

## 2019-10-18 DIAGNOSIS — E1151 Type 2 diabetes mellitus with diabetic peripheral angiopathy without gangrene: Secondary | ICD-10-CM | POA: Diagnosis not present

## 2019-10-18 DIAGNOSIS — E1142 Type 2 diabetes mellitus with diabetic polyneuropathy: Secondary | ICD-10-CM | POA: Diagnosis not present

## 2019-10-18 DIAGNOSIS — E782 Mixed hyperlipidemia: Secondary | ICD-10-CM | POA: Diagnosis not present

## 2019-10-18 DIAGNOSIS — I129 Hypertensive chronic kidney disease with stage 1 through stage 4 chronic kidney disease, or unspecified chronic kidney disease: Secondary | ICD-10-CM | POA: Diagnosis not present

## 2019-10-18 DIAGNOSIS — N1832 Chronic kidney disease, stage 3b: Secondary | ICD-10-CM | POA: Diagnosis not present

## 2019-10-18 DIAGNOSIS — G459 Transient cerebral ischemic attack, unspecified: Secondary | ICD-10-CM | POA: Diagnosis not present

## 2019-10-18 DIAGNOSIS — E039 Hypothyroidism, unspecified: Secondary | ICD-10-CM | POA: Diagnosis not present

## 2019-10-19 ENCOUNTER — Other Ambulatory Visit: Payer: Self-pay

## 2019-10-23 ENCOUNTER — Other Ambulatory Visit: Payer: Self-pay

## 2019-10-23 ENCOUNTER — Encounter: Payer: Self-pay | Admitting: Obstetrics & Gynecology

## 2019-10-23 ENCOUNTER — Ambulatory Visit: Payer: Medicare HMO | Admitting: Obstetrics & Gynecology

## 2019-10-23 VITALS — BP 136/88 | Ht 65.0 in | Wt 138.0 lb

## 2019-10-23 DIAGNOSIS — M8589 Other specified disorders of bone density and structure, multiple sites: Secondary | ICD-10-CM

## 2019-10-23 DIAGNOSIS — Z01419 Encounter for gynecological examination (general) (routine) without abnormal findings: Secondary | ICD-10-CM

## 2019-10-23 DIAGNOSIS — Z90710 Acquired absence of both cervix and uterus: Secondary | ICD-10-CM | POA: Diagnosis not present

## 2019-10-23 DIAGNOSIS — Z780 Asymptomatic menopausal state: Secondary | ICD-10-CM | POA: Diagnosis not present

## 2019-10-23 DIAGNOSIS — N898 Other specified noninflammatory disorders of vagina: Secondary | ICD-10-CM | POA: Diagnosis not present

## 2019-10-23 DIAGNOSIS — R300 Dysuria: Secondary | ICD-10-CM | POA: Diagnosis not present

## 2019-10-23 LAB — WET PREP FOR TRICH, YEAST, CLUE

## 2019-10-23 MED ORDER — TERCONAZOLE 0.8 % VA CREA: 1.0000 | TOPICAL_CREAM | Freq: Every day | VAGINAL | 2 refills | Status: AC

## 2019-10-23 MED ORDER — FLUCONAZOLE 150 MG PO TABS: 150.0000 mg | ORAL_TABLET | Freq: Every day | ORAL | 2 refills | Status: AC

## 2019-10-23 NOTE — Patient Instructions (Signed)
1. Well female exam with routine gynecological exam Gynecologic exam status post total hysterectomy and menopause.  No indication to repeat Pap test.  Breast exam normal. [place] 4 yrs ago.  Health Labs with Fam MD.  2. S/P total hysterectomy  3. Postmenopausal Well on no HRT.    4. Osteopenia of multiple sites BD 05/2017 Osteopenia T-Score -2.4 at the Spine.  Repeat a BD this year.  5. Vaginal discharge Wet prep and urine showing Yeasts.  Will treat with Fluconazole and Terazole 3.  Usage reviewed and prescriptions sent to [place], YEAST, CLUE  6. Vaginal pruritus As above - [facility], [place], CLUE  7. Dysuria Will wait on Urine Culture to decide if treatment is needed. - Urinalysis,Complete w/RFL Culture  Other orders - fluconazole (DIFLUCAN) 150 MG tablet; Take 1 tablet (150 mg total) by mouth daily for 3 days. - terconazole (TERAZOL 3) 0.8 % vaginal cream; Place 1 applicator vaginally at bedtime for 3 days.  [name] [name], it was a pleasure seeing you today!  I will inform you of your results as soon as they are available.

## 2019-10-23 NOTE — Progress Notes (Signed)
[name] [name] 03-21-1934 [id number]   History:    86 y.o. G5P5L4 Accompanied by daughter.  RP:  Established patient presenting for annual gyn exam   HPI: Status post total hysterectomy.  No pelvic pain currently.  Vulvar itching/irritation.  Abstinent.  Urinary frequency unchanged.  Mild burning with urination.  Bowel movements normal.  Breasts normal.  Body mass index 22.96. Patient is keeping active.  Health labs with family physician.   Past medical history,surgical history, family history and social history were all reviewed and documented in the EPIC chart.  Gynecologic History No LMP recorded. Patient has had a hysterectomy.  Obstetric History OB History  Gravida Para Term Preterm AB Living  5 5 0 0 0 4  SAB TAB Ectopic Multiple Live Births  0 0 0        # Outcome Date GA Lbr Len/2nd Weight Sex Delivery Anes PTL Lv  5 Para           4 Para           3 Para           2 Para           1 Para             Obstetric Comments  1 son passed away      ROS: A ROS was performed and pertinent positives and negatives are included in the history.  GENERAL: No fevers or chills. HEENT: No change in vision, no earache, sore throat or sinus congestion. NECK: No pain or stiffness. CARDIOVASCULAR: No chest pain or pressure. No palpitations. PULMONARY: No shortness of breath, cough or wheeze. GASTROINTESTINAL: No abdominal pain, nausea, vomiting or diarrhea, melena or bright red blood per rectum. GENITOURINARY: No urinary frequency, urgency, hesitancy or dysuria. MUSCULOSKELETAL: No joint or muscle pain, no back pain, no recent trauma. DERMATOLOGIC: No rash, no itching, no lesions. ENDOCRINE: No polyuria, polydipsia, no heat or cold intolerance. No recent change in weight. HEMATOLOGICAL: No anemia or easy bruising or bleeding. NEUROLOGIC: No headache, seizures, numbness, tingling or weakness. PSYCHIATRIC: No depression, no loss of interest in normal activity or change in sleep pattern.       Exam:  10/23/19 2:19 PM      BP  136/88   Weight  138lb(62.6kg)   Height  5'5"(1.65m)   Other Vitals  BMI 22.96 kg/m2  BSA 1.69 m2        General appearance : Well developed well nourished female. No acute distress HEENT: Eyes: no retinal hemorrhage or exudates,  Neck supple, trachea midline, no carotid bruits, no thyroidmegaly Lungs: Clear to auscultation, no rhonchi or wheezes, or rib retractions  Heart: Regular rate and rhythm, no murmurs or gallops Breast:Examined in sitting and supine position were symmetrical in appearance, no palpable masses or tenderness,  no skin retraction, no nipple inversion, no nipple discharge, no skin discoloration, no axillary or supraclavicular lymphadenopathy Abdomen: no palpable masses or tenderness, no rebound or guarding Extremities: no edema or skin discoloration or tenderness  Pelvic: Vulva: Mild erythema             Vagina: No gross lesions. Mild discharge.  Wet prep done.  Cervix/Uterus absent  Adnexa  Without masses or tenderness  Anus: Normal  Wet prep: Yeasts present U/A: Yellow clear, protein 1+, nitrites negative, white blood cells 6-10, red blood cells negative, few bacteria.  Yeasts few.  Urine culture pending   Assessment/Plan:  86 y.o. female for annual exam  1. Well female exam with routine gynecological exam Gynecologic exam status post total hysterectomy and menopause.  No indication to repeat Pap test.  Breast exam normal. [place] 4 yrs ago.  Health Labs with Fam MD.  2. S/P total hysterectomy  3. Postmenopausal Well on no HRT.    4. Osteopenia of multiple sites BD 05/2017 Osteopenia T-Score -2.4 at the Spine.  Repeat a BD this year.  5. Vaginal discharge Wet prep and urine showing Yeasts.  Will treat with Fluconazole and Terazole 3.  Usage reviewed and prescriptions sent to [place], YEAST, CLUE  6. Vaginal pruritus As above - Foster Jennings, [place], CLUE  7.  Dysuria Will wait on Urine Culture to decide if treatment is needed. - Urinalysis,Complete w/RFL Culture  Other orders - fluconazole (DIFLUCAN) 150 MG tablet; Take 1 tablet (150 mg total) by mouth daily for 3 days. - terconazole (TERAZOL 3) 0.8 % vaginal cream; Place 1 applicator vaginally at bedtime for 3 days.  Princess Bruins MD, 2:25 PM 10/23/2019

## 2019-10-24 ENCOUNTER — Ambulatory Visit: Payer: Medicare HMO | Admitting: Vascular Surgery

## 2019-10-24 ENCOUNTER — Encounter: Payer: Self-pay | Admitting: Vascular Surgery

## 2019-10-24 ENCOUNTER — Other Ambulatory Visit: Payer: Self-pay

## 2019-10-24 DIAGNOSIS — I739 Peripheral vascular disease, unspecified: Secondary | ICD-10-CM | POA: Insufficient documentation

## 2019-10-24 LAB — URINALYSIS, COMPLETE W/RFL CULTURE
Bilirubin Urine: NEGATIVE
Glucose, UA: NEGATIVE
Hgb urine dipstick: NEGATIVE
Hyaline Cast: NONE SEEN /LPF
Ketones, ur: NEGATIVE
Nitrites, Initial: NEGATIVE
RBC / HPF: NONE SEEN /HPF (ref 0–2)
Specific Gravity, Urine: 1.015 (ref 1.001–1.03)
pH: 7 (ref 5.0–8.0)

## 2019-10-24 LAB — URINE CULTURE
MICRO NUMBER:: 10327202
SPECIMEN QUALITY:: ADEQUATE

## 2019-10-24 LAB — CULTURE INDICATED

## 2019-10-24 NOTE — Progress Notes (Signed)
Patient name: [name] [name] MRN: [id number] DOB: 1933/09/12 Sex: female  REASON FOR CONSULT: Evaluate decreased circulation left leg  HPI: [name] [name] is a 86 y.o. female, with history of diabetes, hypertension, hyperlipidemia, GERD, CKD that presents for evaluation of decreased circulation left leg.  Patient was getting a diabetic screen with Dr. Felipa Eth her primary care doctor and noted difficulty finding a pulse in the left leg.  He subsequent obtained noninvasive imaging that showed ABI of 0.97 on the right monophasic distal to the popliteal artery and ABI of 0.66 on the left monophasic throughout the infrainguinal segment.  In trying to delineate her symptoms she describes mostly lower back and buttock pain that can be all the time even when she is not walking.  She also has a fair amount of pain in the knees as well as her ankles.  No specific pain in the toes of the distal foot.  No nonhealing wounds.  Ultimately she denies any previous lower extremity interventions in the past.  Past Medical History:  Diagnosis Date  . Arthritis    "knees, legs" (06/10/2016)  . Ascites   . Chronic kidney disease    "related to my diabetes"  . Chronic lower back pain   . Diabetes mellitus without complication ([place])   . Eczema   . GERD (gastroesophageal reflux disease)   . High cholesterol   . Hyperlipidemia   . Hypertension   . Hypothyroidism   . OAB (overactive bladder)   . Osteoporosis   . Thyroid disease   . Type II diabetes mellitus ([place])   . Urticaria     Past Surgical History:  Procedure Laterality Date  . ABDOMINAL HYSTERECTOMY    . KNEE ARTHROSCOPY    . LAPAROTOMY N/A 12/03/2016   Procedure: EXPLORATORY LAPAROTOMY, LYSIS OF ADHESIONS;  Surgeon: Armandina Gemma, MD;  Location: [facility];  Service: General;  Laterality: N/A;  . LOOP RECORDER INSERTION N/A 08/23/2019   Procedure: LOOP RECORDER INSERTION;  Surgeon: Evans Lance, MD;  Location: [facility];  Service:  Cardiovascular;  Laterality: N/A;  . vocal cord polyps      Family History  Problem Relation Age of Onset  . Diabetes Mother   . Hypertension Mother   . Diabetes Sister   . Diabetes Brother   . Asthma Father   . Allergic rhinitis Neg Hx   . Eczema Neg Hx   . Urticaria Neg Hx     SOCIAL HISTORY: Social History   Socioeconomic History  . Marital status: Divorced    Spouse name: Not on file  . Number of children: Not on file  . Years of education: Not on file  . Highest education level: Not on file  Occupational History  . Not on file  Tobacco Use  . Smoking status: Never Smoker  . Smokeless tobacco: Former Systems developer    Types: Chew  Substance and Sexual Activity  . Alcohol use: No  . Drug use: No  . Sexual activity: Never  Other Topics Concern  . Not on file  Social History Narrative   ** Merged History Encounter **       Social Determinants of Health   Financial Resource Strain:   . Difficulty of Paying Living Expenses:   Food Insecurity:   . Worried About Charity fundraiser in the Last Year:   . Arboriculturist in the Last Year:   Transportation Needs:   . Film/video editor (Medical):   Marland Kitchen  Lack of Transportation (Non-Medical):   Physical Activity:   . Days of Exercise per Week:   . Minutes of Exercise per Session:   Stress:   . Feeling of Stress :   Social Connections:   . Frequency of Communication with Friends and Family:   . Frequency of Social Gatherings with Friends and Family:   . Attends Religious Services:   . Active Member of Clubs or Organizations:   . Attends Archivist Meetings:   Marland Kitchen Marital Status:   Intimate Partner Violence:   . Fear of Current or Ex-Partner:   . Emotionally Abused:   Marland Kitchen Physically Abused:   . Sexually Abused:     Allergies  Allergen Reactions  . Sulfonamide Derivatives Hives  . Bactrim [Sulfamethoxazole-Trimethoprim] Rash    Reported by [facility]  . Lisinopril Cough    Reported by [facility]    Current Outpatient Medications  Medication Sig Dispense Refill  . acetaminophen (TYLENOL) 500 MG tablet Take 500-1,000 mg by mouth at bedtime as needed for mild pain.     Marland Kitchen aspirin EC 81 MG tablet Take 81 mg by mouth daily.    Marland Kitchen atorvastatin (LIPITOR) 40 MG tablet Take 40 mg by mouth daily at 6 PM.     . B-D UF III MINI PEN NEEDLES 31G X 5 MM MISC USE AS DIRECTED TID    . calcium-vitamin D 250-100 MG-UNIT tablet Take 1 tablet by mouth daily.     . cetirizine (ZYRTEC) 10 MG tablet Take 10 mg by mouth daily as needed for allergies.    . cholecalciferol (VITAMIN D) 1000 units tablet Take 1,000 Units by mouth daily.    Marland Kitchen FLUAD 0.5 ML SUSY ADM 0.5ML IM UTD  0  . fluconazole (DIFLUCAN) 150 MG tablet Take 1 tablet (150 mg total) by mouth daily for 3 days. 3 tablet 2  . insulin aspart (NOVOLOG) 100 UNIT/ML injection Inject 4-12 Units into the skin See admin instructions. Inject 10-12 units at breakfast, 7-9 units at lunch and 4-6 units at dinner.    . Insulin Detemir (LEVEMIR FLEXTOUCH) 100 UNIT/ML Pen Inject 4-6 Units into the skin See admin instructions. Inject 6 units in the morning and 4 units at bedtime.    . Iron-FA-B Cmp-C-Biot-Probiotic (FUSION PLUS) CAPS Take 1 capsule by mouth daily.     Marland Kitchen levETIRAcetam (KEPPRA) 500 MG tablet Take 1 tablet (500 mg total) by mouth 2 (two) times daily. 60 tablet 2  . losartan (COZAAR) 50 MG tablet Take 50 mg by mouth daily.     . Multiple Vitamin (MULTIVITAMIN WITH MINERALS) TABS tablet Take 1 tablet by mouth daily.    Marland Kitchen nystatin (MYCOSTATIN) 100000 UNIT/ML suspension Take 4 mLs by mouth at bedtime as needed.    . Omega-3 Fatty Acids (FISH OIL) 1000 MG CAPS Take 1,000 mg by mouth 2 (two) times daily.    . pantoprazole (PROTONIX) 40 MG tablet Take 40 mg by mouth daily.     . polyethylene glycol (MIRALAX / GLYCOLAX) 17 g packet Take 17 g by mouth daily as needed for mild constipation.    Marland Kitchen SYNTHROID 137 MCG tablet Take 137 mcg by mouth every  morning.    Marland Kitchen terconazole (TERAZOL 3) 0.8 % vaginal cream Place 1 applicator vaginally at bedtime for 3 days. 20 g 2   No current facility-administered medications for this visit.    REVIEW OF SYSTEMS:  [X]  denotes positive finding, [ ]  denotes negative finding Cardiac  Comments:  Chest pain or chest pressure:    Shortness of breath upon exertion:    Short of breath when lying flat:    Irregular heart rhythm:        Vascular    Pain in calf, thigh, or hip brought on by ambulation:    Pain in feet at night that wakes you up from your sleep:     Blood clot in your veins:    Leg swelling:         Pulmonary    Oxygen at home:    Productive cough:     Wheezing:         Neurologic    Sudden weakness in arms or legs:     Sudden numbness in arms or legs:     Sudden onset of difficulty speaking or slurred speech:    Temporary loss of vision in one eye:     Problems with dizziness:         Gastrointestinal    Blood in stool:     Vomited blood:         Genitourinary    Burning when urinating:     Blood in urine:        Psychiatric    Major depression:         Hematologic    Bleeding problems:    Problems with blood clotting too easily:        Skin    Rashes or ulcers:        Constitutional    Fever or chills:      PHYSICAL EXAM: Vitals:   10/24/19 1014  BP: (!) 147/66  Pulse: 62  Resp: 16  Temp: (!) 97.2 F (36.2 C)  TempSrc: Temporal  SpO2: 100%  Weight: 146 lb (66.2 kg)  Height: 5\' 6"  (1.676 m)    GENERAL: The patient is a well-nourished female, in no acute distress. The vital signs are documented above. CARDIAC: There is a regular rate and rhythm.  VASCULAR:  Palpable femoral pulses bilaterally No palpable popliteal pulses No palpable pedal pulses No lower extremity tissue loss PULMONARY: There is good air exchange bilaterally without wheezing or rales. ABDOMEN: Soft and non-tender with normal pitched bowel sounds.  MUSCULOSKELETAL: There are no  major deformities or cyanosis. NEUROLOGIC: No focal weakness or paresthesias are detected. SKIN: There are no ulcers or rashes noted. PSYCHIATRIC: The patient has a normal affect.  DATA:   ABIs on the right are 0.97 monophasic distal to the popliteal artery ABI 0.66 on the left monophasic in the infrarenal segment.  Assessment/Plan:  87 year old female presents for evaluation of decreased circulation to the left leg.  Agree that based on review of her noninvasive imaging she has evidence of moderate PAD with an ABI of 0.66 with monophasic waveform throughout the left infrainguinal segment.  That being said I really tried to delineate her symptoms today and her main complaint is back and buttock pain with lower joint pain in the knee and ankles.  I do not think she has any symptoms consistent with claudication or rest pain at this time and she certainly has no active wounds.  I offered her surveillance and will follow up again in 6 months with repeat ABIs.  We discussed all the concerning findings that would prompt her to come back for evaluation sooner.   Marty Heck, MD [facility] Office: [phone]

## 2019-10-26 ENCOUNTER — Other Ambulatory Visit: Payer: Self-pay | Admitting: *Deleted

## 2019-10-26 ENCOUNTER — Ambulatory Visit (INDEPENDENT_AMBULATORY_CARE_PROVIDER_SITE_OTHER): Payer: Medicare HMO | Admitting: *Deleted

## 2019-10-26 DIAGNOSIS — I739 Peripheral vascular disease, unspecified: Secondary | ICD-10-CM

## 2019-10-26 DIAGNOSIS — I6389 Other cerebral infarction: Secondary | ICD-10-CM

## 2019-10-26 LAB — CUP PACEART REMOTE DEVICE CHECK
Date Time Interrogation Session: 20210407144236
Implantable Pulse Generator Implant Date: 20210203

## 2019-10-26 NOTE — Progress Notes (Signed)
ILR Remote 

## 2019-10-27 DIAGNOSIS — Z5181 Encounter for therapeutic drug level monitoring: Secondary | ICD-10-CM | POA: Diagnosis not present

## 2019-10-27 DIAGNOSIS — E1142 Type 2 diabetes mellitus with diabetic polyneuropathy: Secondary | ICD-10-CM | POA: Diagnosis not present

## 2019-10-27 DIAGNOSIS — L299 Pruritus, unspecified: Secondary | ICD-10-CM | POA: Diagnosis not present

## 2019-10-27 DIAGNOSIS — I129 Hypertensive chronic kidney disease with stage 1 through stage 4 chronic kidney disease, or unspecified chronic kidney disease: Secondary | ICD-10-CM | POA: Diagnosis not present

## 2019-10-27 DIAGNOSIS — Z794 Long term (current) use of insulin: Secondary | ICD-10-CM | POA: Diagnosis not present

## 2019-10-27 DIAGNOSIS — E222 Syndrome of inappropriate secretion of antidiuretic hormone: Secondary | ICD-10-CM | POA: Diagnosis not present

## 2019-10-27 DIAGNOSIS — N1832 Chronic kidney disease, stage 3b: Secondary | ICD-10-CM | POA: Diagnosis not present

## 2019-10-27 DIAGNOSIS — Z79899 Other long term (current) drug therapy: Secondary | ICD-10-CM | POA: Diagnosis not present

## 2019-10-27 DIAGNOSIS — M5412 Radiculopathy, cervical region: Secondary | ICD-10-CM | POA: Diagnosis not present

## 2019-10-27 DIAGNOSIS — E039 Hypothyroidism, unspecified: Secondary | ICD-10-CM | POA: Diagnosis not present

## 2019-10-27 DIAGNOSIS — M4802 Spinal stenosis, cervical region: Secondary | ICD-10-CM | POA: Diagnosis not present

## 2019-10-27 DIAGNOSIS — G40909 Epilepsy, unspecified, not intractable, without status epilepticus: Secondary | ICD-10-CM | POA: Diagnosis not present

## 2019-10-31 DIAGNOSIS — M5412 Radiculopathy, cervical region: Secondary | ICD-10-CM | POA: Diagnosis not present

## 2019-11-01 DIAGNOSIS — R69 Illness, unspecified: Secondary | ICD-10-CM | POA: Diagnosis not present

## 2019-11-03 DIAGNOSIS — M71572 Other bursitis, not elsewhere classified, left ankle and foot: Secondary | ICD-10-CM | POA: Diagnosis not present

## 2019-11-05 DIAGNOSIS — E1165 Type 2 diabetes mellitus with hyperglycemia: Secondary | ICD-10-CM | POA: Diagnosis not present

## 2019-11-05 DIAGNOSIS — E1140 Type 2 diabetes mellitus with diabetic neuropathy, unspecified: Secondary | ICD-10-CM | POA: Diagnosis not present

## 2019-11-05 DIAGNOSIS — E119 Type 2 diabetes mellitus without complications: Secondary | ICD-10-CM | POA: Diagnosis not present

## 2019-11-05 DIAGNOSIS — Z794 Long term (current) use of insulin: Secondary | ICD-10-CM | POA: Diagnosis not present

## 2019-11-09 DIAGNOSIS — D649 Anemia, unspecified: Secondary | ICD-10-CM | POA: Diagnosis not present

## 2019-11-09 DIAGNOSIS — E039 Hypothyroidism, unspecified: Secondary | ICD-10-CM | POA: Diagnosis not present

## 2019-11-09 DIAGNOSIS — Z79899 Other long term (current) drug therapy: Secondary | ICD-10-CM | POA: Diagnosis not present

## 2019-11-14 DIAGNOSIS — E1121 Type 2 diabetes mellitus with diabetic nephropathy: Secondary | ICD-10-CM | POA: Diagnosis not present

## 2019-11-14 DIAGNOSIS — I129 Hypertensive chronic kidney disease with stage 1 through stage 4 chronic kidney disease, or unspecified chronic kidney disease: Secondary | ICD-10-CM | POA: Diagnosis not present

## 2019-11-14 DIAGNOSIS — E039 Hypothyroidism, unspecified: Secondary | ICD-10-CM | POA: Diagnosis not present

## 2019-11-14 DIAGNOSIS — E782 Mixed hyperlipidemia: Secondary | ICD-10-CM | POA: Diagnosis not present

## 2019-11-14 DIAGNOSIS — E1142 Type 2 diabetes mellitus with diabetic polyneuropathy: Secondary | ICD-10-CM | POA: Diagnosis not present

## 2019-11-14 DIAGNOSIS — E1151 Type 2 diabetes mellitus with diabetic peripheral angiopathy without gangrene: Secondary | ICD-10-CM | POA: Diagnosis not present

## 2019-11-14 DIAGNOSIS — G459 Transient cerebral ischemic attack, unspecified: Secondary | ICD-10-CM | POA: Diagnosis not present

## 2019-11-14 DIAGNOSIS — M810 Age-related osteoporosis without current pathological fracture: Secondary | ICD-10-CM | POA: Diagnosis not present

## 2019-11-14 DIAGNOSIS — N1832 Chronic kidney disease, stage 3b: Secondary | ICD-10-CM | POA: Diagnosis not present

## 2019-11-23 ENCOUNTER — Ambulatory Visit: Payer: Medicare HMO | Admitting: Neurology

## 2019-11-24 ENCOUNTER — Ambulatory Visit: Payer: Medicare HMO | Admitting: Allergy

## 2019-11-24 DIAGNOSIS — M4802 Spinal stenosis, cervical region: Secondary | ICD-10-CM | POA: Diagnosis not present

## 2019-11-24 DIAGNOSIS — M5412 Radiculopathy, cervical region: Secondary | ICD-10-CM | POA: Diagnosis not present

## 2019-11-26 LAB — CUP PACEART REMOTE DEVICE CHECK
Date Time Interrogation Session: 20210508144028
Implantable Pulse Generator Implant Date: 20210203

## 2019-11-28 ENCOUNTER — Ambulatory Visit (INDEPENDENT_AMBULATORY_CARE_PROVIDER_SITE_OTHER): Payer: Medicare HMO | Admitting: *Deleted

## 2019-11-28 DIAGNOSIS — I6389 Other cerebral infarction: Secondary | ICD-10-CM | POA: Diagnosis not present

## 2019-11-30 NOTE — Progress Notes (Signed)
Carelink Summary Report / Loop Recorder 

## 2019-12-05 DIAGNOSIS — N1832 Chronic kidney disease, stage 3b: Secondary | ICD-10-CM | POA: Diagnosis not present

## 2019-12-05 DIAGNOSIS — E782 Mixed hyperlipidemia: Secondary | ICD-10-CM | POA: Diagnosis not present

## 2019-12-05 DIAGNOSIS — G459 Transient cerebral ischemic attack, unspecified: Secondary | ICD-10-CM | POA: Diagnosis not present

## 2019-12-05 DIAGNOSIS — I129 Hypertensive chronic kidney disease with stage 1 through stage 4 chronic kidney disease, or unspecified chronic kidney disease: Secondary | ICD-10-CM | POA: Diagnosis not present

## 2019-12-05 DIAGNOSIS — E1121 Type 2 diabetes mellitus with diabetic nephropathy: Secondary | ICD-10-CM | POA: Diagnosis not present

## 2019-12-05 DIAGNOSIS — E1142 Type 2 diabetes mellitus with diabetic polyneuropathy: Secondary | ICD-10-CM | POA: Diagnosis not present

## 2019-12-05 DIAGNOSIS — E039 Hypothyroidism, unspecified: Secondary | ICD-10-CM | POA: Diagnosis not present

## 2019-12-05 DIAGNOSIS — E1151 Type 2 diabetes mellitus with diabetic peripheral angiopathy without gangrene: Secondary | ICD-10-CM | POA: Diagnosis not present

## 2019-12-05 DIAGNOSIS — M810 Age-related osteoporosis without current pathological fracture: Secondary | ICD-10-CM | POA: Diagnosis not present

## 2019-12-15 DIAGNOSIS — N1832 Chronic kidney disease, stage 3b: Secondary | ICD-10-CM | POA: Diagnosis not present

## 2019-12-15 DIAGNOSIS — I129 Hypertensive chronic kidney disease with stage 1 through stage 4 chronic kidney disease, or unspecified chronic kidney disease: Secondary | ICD-10-CM | POA: Diagnosis not present

## 2019-12-15 DIAGNOSIS — M542 Cervicalgia: Secondary | ICD-10-CM | POA: Diagnosis not present

## 2019-12-19 DIAGNOSIS — E1165 Type 2 diabetes mellitus with hyperglycemia: Secondary | ICD-10-CM | POA: Diagnosis not present

## 2019-12-19 DIAGNOSIS — E1140 Type 2 diabetes mellitus with diabetic neuropathy, unspecified: Secondary | ICD-10-CM | POA: Diagnosis not present

## 2019-12-19 DIAGNOSIS — Z794 Long term (current) use of insulin: Secondary | ICD-10-CM | POA: Diagnosis not present

## 2019-12-19 DIAGNOSIS — E119 Type 2 diabetes mellitus without complications: Secondary | ICD-10-CM | POA: Diagnosis not present

## 2020-01-01 ENCOUNTER — Ambulatory Visit (INDEPENDENT_AMBULATORY_CARE_PROVIDER_SITE_OTHER): Payer: Medicare HMO | Admitting: *Deleted

## 2020-01-01 DIAGNOSIS — I6389 Other cerebral infarction: Secondary | ICD-10-CM | POA: Diagnosis not present

## 2020-01-01 LAB — CUP PACEART REMOTE DEVICE CHECK
Date Time Interrogation Session: 20210613230521
Implantable Pulse Generator Implant Date: 20210203

## 2020-01-02 NOTE — Progress Notes (Signed)
Carelink Summary Report / Loop Recorder 

## 2020-01-05 DIAGNOSIS — E039 Hypothyroidism, unspecified: Secondary | ICD-10-CM | POA: Diagnosis not present

## 2020-01-05 DIAGNOSIS — Z794 Long term (current) use of insulin: Secondary | ICD-10-CM | POA: Diagnosis not present

## 2020-01-05 DIAGNOSIS — M545 Low back pain: Secondary | ICD-10-CM | POA: Diagnosis not present

## 2020-01-05 DIAGNOSIS — E1142 Type 2 diabetes mellitus with diabetic polyneuropathy: Secondary | ICD-10-CM | POA: Diagnosis not present

## 2020-01-08 DIAGNOSIS — H1131 Conjunctival hemorrhage, right eye: Secondary | ICD-10-CM | POA: Diagnosis not present

## 2020-01-08 DIAGNOSIS — M545 Low back pain: Secondary | ICD-10-CM | POA: Diagnosis not present

## 2020-01-08 DIAGNOSIS — H9042 Sensorineural hearing loss, unilateral, left ear, with unrestricted hearing on the contralateral side: Secondary | ICD-10-CM | POA: Diagnosis not present

## 2020-01-23 DIAGNOSIS — M170 Bilateral primary osteoarthritis of knee: Secondary | ICD-10-CM | POA: Diagnosis not present

## 2020-01-26 ENCOUNTER — Ambulatory Visit: Payer: Medicare HMO | Admitting: Allergy

## 2020-01-26 DIAGNOSIS — M2041 Other hammer toe(s) (acquired), right foot: Secondary | ICD-10-CM | POA: Diagnosis not present

## 2020-01-26 DIAGNOSIS — L03031 Cellulitis of right toe: Secondary | ICD-10-CM | POA: Diagnosis not present

## 2020-01-29 DIAGNOSIS — R69 Illness, unspecified: Secondary | ICD-10-CM | POA: Diagnosis not present

## 2020-01-30 DIAGNOSIS — E1140 Type 2 diabetes mellitus with diabetic neuropathy, unspecified: Secondary | ICD-10-CM | POA: Diagnosis not present

## 2020-01-30 DIAGNOSIS — E1165 Type 2 diabetes mellitus with hyperglycemia: Secondary | ICD-10-CM | POA: Diagnosis not present

## 2020-01-30 DIAGNOSIS — E119 Type 2 diabetes mellitus without complications: Secondary | ICD-10-CM | POA: Diagnosis not present

## 2020-01-30 DIAGNOSIS — Z794 Long term (current) use of insulin: Secondary | ICD-10-CM | POA: Diagnosis not present

## 2020-01-31 DIAGNOSIS — E782 Mixed hyperlipidemia: Secondary | ICD-10-CM | POA: Diagnosis not present

## 2020-01-31 DIAGNOSIS — M810 Age-related osteoporosis without current pathological fracture: Secondary | ICD-10-CM | POA: Diagnosis not present

## 2020-01-31 DIAGNOSIS — E1121 Type 2 diabetes mellitus with diabetic nephropathy: Secondary | ICD-10-CM | POA: Diagnosis not present

## 2020-01-31 DIAGNOSIS — E1151 Type 2 diabetes mellitus with diabetic peripheral angiopathy without gangrene: Secondary | ICD-10-CM | POA: Diagnosis not present

## 2020-01-31 DIAGNOSIS — I129 Hypertensive chronic kidney disease with stage 1 through stage 4 chronic kidney disease, or unspecified chronic kidney disease: Secondary | ICD-10-CM | POA: Diagnosis not present

## 2020-01-31 DIAGNOSIS — E039 Hypothyroidism, unspecified: Secondary | ICD-10-CM | POA: Diagnosis not present

## 2020-01-31 DIAGNOSIS — E1142 Type 2 diabetes mellitus with diabetic polyneuropathy: Secondary | ICD-10-CM | POA: Diagnosis not present

## 2020-01-31 DIAGNOSIS — N1832 Chronic kidney disease, stage 3b: Secondary | ICD-10-CM | POA: Diagnosis not present

## 2020-01-31 DIAGNOSIS — G459 Transient cerebral ischemic attack, unspecified: Secondary | ICD-10-CM | POA: Diagnosis not present

## 2020-02-02 ENCOUNTER — Ambulatory Visit: Payer: Medicare HMO | Admitting: Family

## 2020-02-02 DIAGNOSIS — R3915 Urgency of urination: Secondary | ICD-10-CM | POA: Diagnosis not present

## 2020-02-02 DIAGNOSIS — K5900 Constipation, unspecified: Secondary | ICD-10-CM | POA: Diagnosis not present

## 2020-02-02 DIAGNOSIS — M542 Cervicalgia: Secondary | ICD-10-CM | POA: Diagnosis not present

## 2020-02-02 DIAGNOSIS — N898 Other specified noninflammatory disorders of vagina: Secondary | ICD-10-CM | POA: Diagnosis not present

## 2020-02-05 ENCOUNTER — Ambulatory Visit (INDEPENDENT_AMBULATORY_CARE_PROVIDER_SITE_OTHER): Payer: Medicare HMO | Admitting: *Deleted

## 2020-02-05 DIAGNOSIS — I6389 Other cerebral infarction: Secondary | ICD-10-CM

## 2020-02-05 LAB — CUP PACEART REMOTE DEVICE CHECK
Date Time Interrogation Session: 20210718230525
Implantable Pulse Generator Implant Date: 20210203

## 2020-02-07 NOTE — Progress Notes (Signed)
Carelink Summary Report / Loop Recorder 

## 2020-02-09 DIAGNOSIS — R2689 Other abnormalities of gait and mobility: Secondary | ICD-10-CM | POA: Diagnosis not present

## 2020-02-09 DIAGNOSIS — L97512 Non-pressure chronic ulcer of other part of right foot with fat layer exposed: Secondary | ICD-10-CM | POA: Diagnosis not present

## 2020-02-09 DIAGNOSIS — M6281 Muscle weakness (generalized): Secondary | ICD-10-CM | POA: Diagnosis not present

## 2020-02-09 DIAGNOSIS — M542 Cervicalgia: Secondary | ICD-10-CM | POA: Diagnosis not present

## 2020-02-16 DIAGNOSIS — R2689 Other abnormalities of gait and mobility: Secondary | ICD-10-CM | POA: Diagnosis not present

## 2020-02-16 DIAGNOSIS — M6281 Muscle weakness (generalized): Secondary | ICD-10-CM | POA: Diagnosis not present

## 2020-02-16 DIAGNOSIS — M542 Cervicalgia: Secondary | ICD-10-CM | POA: Diagnosis not present

## 2020-02-23 DIAGNOSIS — L97512 Non-pressure chronic ulcer of other part of right foot with fat layer exposed: Secondary | ICD-10-CM | POA: Diagnosis not present

## 2020-03-01 DIAGNOSIS — R2689 Other abnormalities of gait and mobility: Secondary | ICD-10-CM | POA: Diagnosis not present

## 2020-03-01 DIAGNOSIS — M542 Cervicalgia: Secondary | ICD-10-CM | POA: Diagnosis not present

## 2020-03-01 DIAGNOSIS — M6281 Muscle weakness (generalized): Secondary | ICD-10-CM | POA: Diagnosis not present

## 2020-03-08 DIAGNOSIS — E039 Hypothyroidism, unspecified: Secondary | ICD-10-CM | POA: Diagnosis not present

## 2020-03-08 DIAGNOSIS — E11621 Type 2 diabetes mellitus with foot ulcer: Secondary | ICD-10-CM | POA: Diagnosis not present

## 2020-03-08 DIAGNOSIS — E1165 Type 2 diabetes mellitus with hyperglycemia: Secondary | ICD-10-CM | POA: Diagnosis not present

## 2020-03-08 DIAGNOSIS — L97512 Non-pressure chronic ulcer of other part of right foot with fat layer exposed: Secondary | ICD-10-CM | POA: Diagnosis not present

## 2020-03-08 DIAGNOSIS — E1142 Type 2 diabetes mellitus with diabetic polyneuropathy: Secondary | ICD-10-CM | POA: Diagnosis not present

## 2020-03-08 DIAGNOSIS — Z794 Long term (current) use of insulin: Secondary | ICD-10-CM | POA: Diagnosis not present

## 2020-03-11 ENCOUNTER — Ambulatory Visit (INDEPENDENT_AMBULATORY_CARE_PROVIDER_SITE_OTHER): Payer: Medicare HMO | Admitting: *Deleted

## 2020-03-11 DIAGNOSIS — I6389 Other cerebral infarction: Secondary | ICD-10-CM | POA: Diagnosis not present

## 2020-03-11 LAB — CUP PACEART REMOTE DEVICE CHECK
Date Time Interrogation Session: 20210820230043
Implantable Pulse Generator Implant Date: 20210203

## 2020-03-14 NOTE — Patient Instructions (Addendum)
Allergic rhinitis Continue allergen avoidance measures directed toward dust mite, mold, and cockroach as listed below Continue Flonase 2 sprays in each nostril once a day as needed for a stuffy nose.  In the right nostril, point the applicator out toward the right ear. In the left nostril, point the applicator out toward the left ear Stop cetirizine. Begin azelastine nasal spray 2 sprays in each nostril twice a day as needed for a runny nose or sinus headache Use saline nasal rinses once a day or more for nasal symptoms. Use this before any medicated nasal sprays for best result For thick post nasal drainage begin Mucinex 600 mg twice a day to thin mucus Call the clinic if your symptoms worsen or do not improve  Allergic conjunctivitis Some over the counter eye drops include Pataday one drop in each eye once a day as needed for red, itchy eyes OR Zaditor one drop in each eye twice a day as needed for red itchy eyes.  Call the clinic if this treatment plan is not working well for you  Follow up in 3 months or sooner if needed.  Control of Dust Mite Allergen Dust mites play a major role in allergic asthma and rhinitis. They occur in environments with high humidity wherever human skin is found. Dust mites absorb humidity from the atmosphere (ie, they do not drink) and feed on organic matter (including shed human and animal skin). Dust mites are a microscopic type of insect that you cannot see with the naked eye. High levels of dust mites have been detected from mattresses, pillows, carpets, upholstered furniture, bed covers, clothes, soft toys and any woven material. The principal allergen of the dust mite is found in its feces. A gram of dust may contain 1,000 mites and 250,000 fecal particles. Mite antigen is easily measured in the air during house cleaning activities. Dust mites do not bite and do not cause harm to humans, other than by triggering allergies/asthma.  Ways to decrease your exposure  to dust mites in your home:  1. Encase mattresses, box springs and pillows with a mite-impermeable barrier or cover  2. Wash sheets, blankets and drapes weekly in hot water (130 F) with detergent and dry them in a dryer on the hot setting.  3. Have the room cleaned frequently with a vacuum cleaner and a damp dust-mop. For carpeting or rugs, vacuuming with a vacuum cleaner equipped with a high-efficiency particulate air (HEPA) filter. The dust mite allergic individual should not be in a room which is being cleaned and should wait 1 hour after cleaning before going into the room.  4. Do not sleep on upholstered furniture (eg, couches).  5. If possible removing carpeting, upholstered furniture and drapery from the home is ideal. Horizontal blinds should be eliminated in the rooms where the person spends the most time (bedroom, study, television room). Washable vinyl, roller-type shades are optimal.  6. Remove all non-washable stuffed toys from the bedroom. Wash stuffed toys weekly like sheets and blankets above.  7. Reduce indoor humidity to less than 50%. Inexpensive humidity monitors can be purchased at most hardware stores. Do not use a humidifier as can make the problem worse and are not recommended.  Control of Mold Allergen Mold and fungi can grow on a variety of surfaces provided certain temperature and moisture conditions exist.  Outdoor molds grow on plants, decaying vegetation and soil.  The major outdoor mold, Alternaria and Cladosporium, are found in very high numbers during hot and  dry conditions.  Generally, a late Summer - Fall peak is seen for common outdoor fungal spores.  Rain will temporarily lower outdoor mold spore count, but counts rise rapidly when the rainy period ends.  The most important indoor molds are Aspergillus and Penicillium.  Dark, humid and poorly ventilated basements are ideal sites for mold growth.  The next most common sites of mold growth are the bathroom and the  kitchen.  Outdoor [employer] 1. Use air conditioning and keep windows closed 2. Avoid exposure to decaying vegetation. 3. Avoid leaf raking. 4. Avoid grain handling. 5. Consider wearing a face mask if working in moldy areas.  Indoor Mold Control 1. Maintain humidity below 50%. 2. Clean washable surfaces with 5% bleach solution. 3. Remove sources e.g. Contaminated carpets.  Control of Cockroach Allergen  Cockroach allergen has been identified as an important cause of acute attacks of asthma, especially in urban settings.  There are fifty-five species of cockroach that exist in the [place], however only three, the [place], [place] species produce allergen that can affect patients with Asthma.  Allergens can be obtained from fecal particles, egg casings and secretions from cockroaches.    1. Remove food sources. 2. Reduce access to water. 3. Seal access and entry points. 4. Spray runways with 0.5-1% Diazinon or Chlorpyrifos 5. Blow boric acid power under stoves and refrigerator. 6. Place bait stations (hydramethylnon) at feeding sites.

## 2020-03-14 NOTE — Progress Notes (Signed)
[address] [place] [address] Dept: [phone]  FOLLOW UP NOTE  Patient ID: [name] [name], female    DOB: [birth year]  Age: 86 y.o. MRN: [id number] Date of Office Visit: 03/15/2020  Assessment  Chief Complaint: Allergic Rhinitis  (stuffy nose, congestion, ear and nose pain )  HPI [name] [name] is an 86 year old female who presents to the clinic for follow-up visit.  She was last seen in this clinic on 04/05/2019 by Dr. Nelva Bush for evaluation of allergic rhinitis and allergic conjunctivitis.  At today's visit, she reports she has been experiencing symptoms including ears " stopped up" and full of pressure, nasal congestion, clear rhinorrhea, sneezing, and increased throat clearing occurring for about the last 2 months.  She denies fever and sick contacts.  Her environmental allergies include dust mite, cockroach, and mold.  She is currently using cetirizine once a day, Flonase once a day, and nasal saline rinses occasionally.  She reports allergic conjunctivitis has been moderately well controlled with eye itching and watery drainage.  She is using a daily eyedrop, however, she is not sure what the name of this medication is.  Her current medications are listed in the chart.   Drug Allergies:  Allergies  Allergen Reactions  . Sulfonamide Derivatives Hives  . Bactrim [Sulfamethoxazole-Trimethoprim] Rash    Reported by [facility]  . Lisinopril Cough    Reported by [facility]    Physical Exam: BP 118/62 (BP Location: Right Arm, Patient Position: Sitting, Cuff Size: Normal)   Pulse 70   Temp 97.7 F (36.5 C) (Temporal)   Resp 18   Ht 5' 4.1" (1.628 m)   Wt 139 lb 6.4 oz (63.2 kg)   SpO2 96%   BMI 23.85 kg/m    Physical Exam Vitals reviewed.  Constitutional:      Appearance: Normal appearance.  HENT:     Head: Normocephalic and atraumatic.     Right Ear: Tympanic membrane normal.     Left Ear: Tympanic membrane normal.     Nose:     Comments:  Bilateral nares slightly erythematous with clear nasal drainage noted.  Pharynx normal.  Ears normal.  Eyes normal.    Mouth/Throat:     Pharynx: Oropharynx is clear.  Eyes:     Conjunctiva/sclera: Conjunctivae normal.  Cardiovascular:     Rate and Rhythm: Normal rate and regular rhythm.     Heart sounds: Normal heart sounds. No murmur heard.   Pulmonary:     Effort: Pulmonary effort is normal.     Breath sounds: Normal breath sounds.     Comments: Lungs clear to auscultation Musculoskeletal:        General: Normal range of motion.     Cervical back: Normal range of motion and neck supple.  Skin:    General: Skin is warm and dry.  Neurological:     Mental Status: She is alert and oriented to person, place, and time.  Psychiatric:        Mood and Affect: Mood normal.        Behavior: Behavior normal.        Thought Content: Thought content normal.        Judgment: Judgment normal.     Assessment and Plan: 1. Non-seasonal allergic rhinitis due to other allergic trigger   2. Allergic conjunctivitis of both eyes     Meds ordered this encounter  Medications  . azelastine (ASTELIN) 0.1 % nasal spray    Sig: Place  2 sprays into both nostrils 2 (two) times daily. Use in each nostril as directed    Dispense:  30 mL    Refill:  2    Patient Instructions  Allergic rhinitis Continue allergen avoidance measures directed toward dust mite, mold, and cockroach as listed below Continue Flonase 2 sprays in each nostril once a day as needed for a stuffy nose.  In the right nostril, point the applicator out toward the right ear. In the left nostril, point the applicator out toward the left ear Stop cetirizine. Begin azelastine nasal spray 2 sprays in each nostril twice a day as needed for a runny nose or sinus headache Use saline nasal rinses once a day or more for nasal symptoms. Use this before any medicated nasal sprays for best result For thick post nasal drainage begin Mucinex 600 mg  twice a day to thin mucus Call the clinic if your symptoms worsen or do not improve  Allergic conjunctivitis Some over the counter eye drops include Pataday one drop in each eye once a day as needed for red, itchy eyes OR Zaditor one drop in each eye twice a day as needed for red itchy eyes.  Call the clinic if this treatment plan is not working well for you  Follow up in 3 months or sooner if needed.   Return in about 3 months (around 06/15/2020), or if symptoms worsen or fail to improve.    Thank you for the opportunity to care for this patient.  Please do not hesitate to contact me with questions.  Gareth Morgan, FNP [facility]

## 2020-03-15 ENCOUNTER — Other Ambulatory Visit: Payer: Self-pay

## 2020-03-15 ENCOUNTER — Ambulatory Visit: Payer: Medicare HMO | Admitting: Family Medicine

## 2020-03-15 ENCOUNTER — Encounter: Payer: Self-pay | Admitting: Family Medicine

## 2020-03-15 VITALS — BP 118/62 | HR 70 | Temp 97.7°F | Resp 18 | Ht 64.1 in | Wt 139.4 lb

## 2020-03-15 DIAGNOSIS — H1013 Acute atopic conjunctivitis, bilateral: Secondary | ICD-10-CM | POA: Insufficient documentation

## 2020-03-15 DIAGNOSIS — J3089 Other allergic rhinitis: Secondary | ICD-10-CM

## 2020-03-15 MED ORDER — AZELASTINE HCL 0.1 % NA SOLN: 2.0000 | Freq: Two times a day (BID) | NASAL | 2 refills | Status: DC

## 2020-03-15 NOTE — Progress Notes (Signed)
Carelink Summary Report / Loop Recorder 

## 2020-03-19 DIAGNOSIS — M5412 Radiculopathy, cervical region: Secondary | ICD-10-CM | POA: Diagnosis not present

## 2020-03-22 DIAGNOSIS — L97512 Non-pressure chronic ulcer of other part of right foot with fat layer exposed: Secondary | ICD-10-CM | POA: Diagnosis not present

## 2020-03-29 DIAGNOSIS — L603 Nail dystrophy: Secondary | ICD-10-CM | POA: Diagnosis not present

## 2020-03-29 DIAGNOSIS — E1151 Type 2 diabetes mellitus with diabetic peripheral angiopathy without gangrene: Secondary | ICD-10-CM | POA: Diagnosis not present

## 2020-03-29 DIAGNOSIS — I739 Peripheral vascular disease, unspecified: Secondary | ICD-10-CM | POA: Diagnosis not present

## 2020-03-29 DIAGNOSIS — L84 Corns and callosities: Secondary | ICD-10-CM | POA: Diagnosis not present

## 2020-04-05 DIAGNOSIS — L729 Follicular cyst of the skin and subcutaneous tissue, unspecified: Secondary | ICD-10-CM | POA: Diagnosis not present

## 2020-04-05 DIAGNOSIS — N1832 Chronic kidney disease, stage 3b: Secondary | ICD-10-CM | POA: Diagnosis not present

## 2020-04-05 DIAGNOSIS — K219 Gastro-esophageal reflux disease without esophagitis: Secondary | ICD-10-CM | POA: Diagnosis not present

## 2020-04-05 DIAGNOSIS — I129 Hypertensive chronic kidney disease with stage 1 through stage 4 chronic kidney disease, or unspecified chronic kidney disease: Secondary | ICD-10-CM | POA: Diagnosis not present

## 2020-04-05 DIAGNOSIS — R197 Diarrhea, unspecified: Secondary | ICD-10-CM | POA: Diagnosis not present

## 2020-04-05 DIAGNOSIS — Z23 Encounter for immunization: Secondary | ICD-10-CM | POA: Diagnosis not present

## 2020-04-05 DIAGNOSIS — Z79899 Other long term (current) drug therapy: Secondary | ICD-10-CM | POA: Diagnosis not present

## 2020-04-11 DIAGNOSIS — M4802 Spinal stenosis, cervical region: Secondary | ICD-10-CM | POA: Diagnosis not present

## 2020-04-15 ENCOUNTER — Ambulatory Visit (INDEPENDENT_AMBULATORY_CARE_PROVIDER_SITE_OTHER): Payer: Medicare HMO | Admitting: Emergency Medicine

## 2020-04-15 DIAGNOSIS — I6389 Other cerebral infarction: Secondary | ICD-10-CM | POA: Diagnosis not present

## 2020-04-15 LAB — CUP PACEART REMOTE DEVICE CHECK
Date Time Interrogation Session: 20210922230456
Implantable Pulse Generator Implant Date: 20210203

## 2020-04-17 ENCOUNTER — Telehealth: Payer: Self-pay | Admitting: Family Medicine

## 2020-04-17 DIAGNOSIS — M810 Age-related osteoporosis without current pathological fracture: Secondary | ICD-10-CM | POA: Diagnosis not present

## 2020-04-17 DIAGNOSIS — I129 Hypertensive chronic kidney disease with stage 1 through stage 4 chronic kidney disease, or unspecified chronic kidney disease: Secondary | ICD-10-CM | POA: Diagnosis not present

## 2020-04-17 DIAGNOSIS — E782 Mixed hyperlipidemia: Secondary | ICD-10-CM | POA: Diagnosis not present

## 2020-04-17 DIAGNOSIS — E1121 Type 2 diabetes mellitus with diabetic nephropathy: Secondary | ICD-10-CM | POA: Diagnosis not present

## 2020-04-17 DIAGNOSIS — G459 Transient cerebral ischemic attack, unspecified: Secondary | ICD-10-CM | POA: Diagnosis not present

## 2020-04-17 DIAGNOSIS — E039 Hypothyroidism, unspecified: Secondary | ICD-10-CM | POA: Diagnosis not present

## 2020-04-17 DIAGNOSIS — E1151 Type 2 diabetes mellitus with diabetic peripheral angiopathy without gangrene: Secondary | ICD-10-CM | POA: Diagnosis not present

## 2020-04-17 DIAGNOSIS — E11621 Type 2 diabetes mellitus with foot ulcer: Secondary | ICD-10-CM | POA: Diagnosis not present

## 2020-04-17 DIAGNOSIS — E1142 Type 2 diabetes mellitus with diabetic polyneuropathy: Secondary | ICD-10-CM | POA: Diagnosis not present

## 2020-04-17 DIAGNOSIS — N1832 Chronic kidney disease, stage 3b: Secondary | ICD-10-CM | POA: Diagnosis not present

## 2020-04-17 NOTE — Telephone Encounter (Signed)
Patient would like to know if she is supposed to stay on Mucinex.

## 2020-04-17 NOTE — Telephone Encounter (Signed)
I called and spoke to patient and read what her discharge from her visit last month with anne. I informed her if her mucus was no longer thick that she could stop taking it but if it started up again to restart the Mucinex. I also stated if right after stopping the Mucinex and it gets thick again to continue taking it even if it thins out. Patient expressed understanding.

## 2020-04-17 NOTE — Progress Notes (Signed)
Carelink Summary Report / Loop Recorder 

## 2020-04-17 NOTE — Telephone Encounter (Signed)
Perfect. Thank you!

## 2020-04-30 DIAGNOSIS — R69 Illness, unspecified: Secondary | ICD-10-CM | POA: Diagnosis not present

## 2020-05-01 DIAGNOSIS — N1832 Chronic kidney disease, stage 3b: Secondary | ICD-10-CM | POA: Diagnosis not present

## 2020-05-01 DIAGNOSIS — I129 Hypertensive chronic kidney disease with stage 1 through stage 4 chronic kidney disease, or unspecified chronic kidney disease: Secondary | ICD-10-CM | POA: Diagnosis not present

## 2020-05-01 DIAGNOSIS — E119 Type 2 diabetes mellitus without complications: Secondary | ICD-10-CM | POA: Diagnosis not present

## 2020-05-01 DIAGNOSIS — E1140 Type 2 diabetes mellitus with diabetic neuropathy, unspecified: Secondary | ICD-10-CM | POA: Diagnosis not present

## 2020-05-01 DIAGNOSIS — E782 Mixed hyperlipidemia: Secondary | ICD-10-CM | POA: Diagnosis not present

## 2020-05-01 DIAGNOSIS — E1121 Type 2 diabetes mellitus with diabetic nephropathy: Secondary | ICD-10-CM | POA: Diagnosis not present

## 2020-05-01 DIAGNOSIS — E039 Hypothyroidism, unspecified: Secondary | ICD-10-CM | POA: Diagnosis not present

## 2020-05-01 DIAGNOSIS — E1151 Type 2 diabetes mellitus with diabetic peripheral angiopathy without gangrene: Secondary | ICD-10-CM | POA: Diagnosis not present

## 2020-05-01 DIAGNOSIS — E1165 Type 2 diabetes mellitus with hyperglycemia: Secondary | ICD-10-CM | POA: Diagnosis not present

## 2020-05-01 DIAGNOSIS — Z794 Long term (current) use of insulin: Secondary | ICD-10-CM | POA: Diagnosis not present

## 2020-05-01 DIAGNOSIS — G459 Transient cerebral ischemic attack, unspecified: Secondary | ICD-10-CM | POA: Diagnosis not present

## 2020-05-01 DIAGNOSIS — E1142 Type 2 diabetes mellitus with diabetic polyneuropathy: Secondary | ICD-10-CM | POA: Diagnosis not present

## 2020-05-01 DIAGNOSIS — M810 Age-related osteoporosis without current pathological fracture: Secondary | ICD-10-CM | POA: Diagnosis not present

## 2020-05-01 DIAGNOSIS — E11621 Type 2 diabetes mellitus with foot ulcer: Secondary | ICD-10-CM | POA: Diagnosis not present

## 2020-05-09 DIAGNOSIS — E039 Hypothyroidism, unspecified: Secondary | ICD-10-CM | POA: Diagnosis not present

## 2020-05-16 DIAGNOSIS — Z794 Long term (current) use of insulin: Secondary | ICD-10-CM | POA: Diagnosis not present

## 2020-05-16 DIAGNOSIS — E1142 Type 2 diabetes mellitus with diabetic polyneuropathy: Secondary | ICD-10-CM | POA: Diagnosis not present

## 2020-05-16 DIAGNOSIS — I129 Hypertensive chronic kidney disease with stage 1 through stage 4 chronic kidney disease, or unspecified chronic kidney disease: Secondary | ICD-10-CM | POA: Diagnosis not present

## 2020-05-16 DIAGNOSIS — N1832 Chronic kidney disease, stage 3b: Secondary | ICD-10-CM | POA: Diagnosis not present

## 2020-05-18 LAB — CUP PACEART REMOTE DEVICE CHECK
Date Time Interrogation Session: 20211025230532
Implantable Pulse Generator Implant Date: 20210203

## 2020-05-20 ENCOUNTER — Ambulatory Visit (INDEPENDENT_AMBULATORY_CARE_PROVIDER_SITE_OTHER): Payer: Medicare HMO

## 2020-05-20 DIAGNOSIS — I6389 Other cerebral infarction: Secondary | ICD-10-CM

## 2020-05-22 NOTE — Progress Notes (Signed)
Carelink Summary Report / Loop Recorder 

## 2020-06-04 DIAGNOSIS — E039 Hypothyroidism, unspecified: Secondary | ICD-10-CM | POA: Diagnosis not present

## 2020-06-04 DIAGNOSIS — E1165 Type 2 diabetes mellitus with hyperglycemia: Secondary | ICD-10-CM | POA: Diagnosis not present

## 2020-06-04 DIAGNOSIS — M170 Bilateral primary osteoarthritis of knee: Secondary | ICD-10-CM | POA: Diagnosis not present

## 2020-06-04 DIAGNOSIS — Z794 Long term (current) use of insulin: Secondary | ICD-10-CM | POA: Diagnosis not present

## 2020-06-04 DIAGNOSIS — E1142 Type 2 diabetes mellitus with diabetic polyneuropathy: Secondary | ICD-10-CM | POA: Diagnosis not present

## 2020-06-17 DIAGNOSIS — N1832 Chronic kidney disease, stage 3b: Secondary | ICD-10-CM | POA: Diagnosis not present

## 2020-06-17 DIAGNOSIS — G459 Transient cerebral ischemic attack, unspecified: Secondary | ICD-10-CM | POA: Diagnosis not present

## 2020-06-17 DIAGNOSIS — E1151 Type 2 diabetes mellitus with diabetic peripheral angiopathy without gangrene: Secondary | ICD-10-CM | POA: Diagnosis not present

## 2020-06-17 DIAGNOSIS — E1142 Type 2 diabetes mellitus with diabetic polyneuropathy: Secondary | ICD-10-CM | POA: Diagnosis not present

## 2020-06-17 DIAGNOSIS — I129 Hypertensive chronic kidney disease with stage 1 through stage 4 chronic kidney disease, or unspecified chronic kidney disease: Secondary | ICD-10-CM | POA: Diagnosis not present

## 2020-06-17 DIAGNOSIS — E11621 Type 2 diabetes mellitus with foot ulcer: Secondary | ICD-10-CM | POA: Diagnosis not present

## 2020-06-17 DIAGNOSIS — E782 Mixed hyperlipidemia: Secondary | ICD-10-CM | POA: Diagnosis not present

## 2020-06-17 DIAGNOSIS — M810 Age-related osteoporosis without current pathological fracture: Secondary | ICD-10-CM | POA: Diagnosis not present

## 2020-06-17 DIAGNOSIS — E1121 Type 2 diabetes mellitus with diabetic nephropathy: Secondary | ICD-10-CM | POA: Diagnosis not present

## 2020-06-17 DIAGNOSIS — E039 Hypothyroidism, unspecified: Secondary | ICD-10-CM | POA: Diagnosis not present

## 2020-06-19 DIAGNOSIS — R0981 Nasal congestion: Secondary | ICD-10-CM | POA: Diagnosis not present

## 2020-06-19 DIAGNOSIS — R519 Headache, unspecified: Secondary | ICD-10-CM | POA: Diagnosis not present

## 2020-06-21 LAB — CUP PACEART REMOTE DEVICE CHECK
Date Time Interrogation Session: 20211127230408
Implantable Pulse Generator Implant Date: 20210203

## 2020-06-24 ENCOUNTER — Ambulatory Visit (INDEPENDENT_AMBULATORY_CARE_PROVIDER_SITE_OTHER): Payer: Medicare HMO

## 2020-06-24 DIAGNOSIS — I6389 Other cerebral infarction: Secondary | ICD-10-CM | POA: Diagnosis not present

## 2020-07-03 NOTE — Progress Notes (Signed)
Carelink Summary Report / Loop Recorder 

## 2020-07-07 DIAGNOSIS — R69 Illness, unspecified: Secondary | ICD-10-CM | POA: Diagnosis not present

## 2020-07-25 DIAGNOSIS — L603 Nail dystrophy: Secondary | ICD-10-CM | POA: Diagnosis not present

## 2020-07-25 DIAGNOSIS — E1151 Type 2 diabetes mellitus with diabetic peripheral angiopathy without gangrene: Secondary | ICD-10-CM | POA: Diagnosis not present

## 2020-07-25 DIAGNOSIS — I739 Peripheral vascular disease, unspecified: Secondary | ICD-10-CM | POA: Diagnosis not present

## 2020-07-25 DIAGNOSIS — L84 Corns and callosities: Secondary | ICD-10-CM | POA: Diagnosis not present

## 2020-07-29 ENCOUNTER — Ambulatory Visit (INDEPENDENT_AMBULATORY_CARE_PROVIDER_SITE_OTHER): Payer: Medicare HMO

## 2020-07-29 DIAGNOSIS — I6389 Other cerebral infarction: Secondary | ICD-10-CM | POA: Diagnosis not present

## 2020-07-29 LAB — CUP PACEART REMOTE DEVICE CHECK
Date Time Interrogation Session: 20220108230405
Implantable Pulse Generator Implant Date: 20210203

## 2020-08-02 ENCOUNTER — Telehealth: Payer: Self-pay | Admitting: Allergy

## 2020-08-02 DIAGNOSIS — E1140 Type 2 diabetes mellitus with diabetic neuropathy, unspecified: Secondary | ICD-10-CM | POA: Diagnosis not present

## 2020-08-02 DIAGNOSIS — E1165 Type 2 diabetes mellitus with hyperglycemia: Secondary | ICD-10-CM | POA: Diagnosis not present

## 2020-08-02 DIAGNOSIS — E119 Type 2 diabetes mellitus without complications: Secondary | ICD-10-CM | POA: Diagnosis not present

## 2020-08-02 DIAGNOSIS — Z794 Long term (current) use of insulin: Secondary | ICD-10-CM | POA: Diagnosis not present

## 2020-08-02 NOTE — Telephone Encounter (Signed)
Patient informed. 

## 2020-08-02 NOTE — Telephone Encounter (Signed)
Patient states she has had a lot of head pressure and it is making her ears hurt. She says the ear pain comes and goes. Patient wanted to know if there is anything she can put in her ears to help.  Please advise.

## 2020-08-02 NOTE — Telephone Encounter (Signed)
A nasal steroid spray would be best to use for sinus pressure due to congestion to see if it will help.  Would recommend she use the Flonase (or other nasal steroid like nasacort, rhinocort) 2 sprays each nostril daily for the next 1-2 weeks

## 2020-08-02 NOTE — Telephone Encounter (Signed)
Patient has had the issue for 4 months. The ear pain and head pressure are worse when she lays down. She is taking mucinex, azelastine, nasal saline rinses. She is not taking Flonase. She says that the mucinex has helped but she is unsure if she should be taking it every night. I did schedule her for 08/28/20 with Dr. Nelva Bush as she only wanted to see her.

## 2020-08-12 DIAGNOSIS — E1151 Type 2 diabetes mellitus with diabetic peripheral angiopathy without gangrene: Secondary | ICD-10-CM | POA: Diagnosis not present

## 2020-08-12 DIAGNOSIS — E11621 Type 2 diabetes mellitus with foot ulcer: Secondary | ICD-10-CM | POA: Diagnosis not present

## 2020-08-12 DIAGNOSIS — E039 Hypothyroidism, unspecified: Secondary | ICD-10-CM | POA: Diagnosis not present

## 2020-08-12 DIAGNOSIS — E1142 Type 2 diabetes mellitus with diabetic polyneuropathy: Secondary | ICD-10-CM | POA: Diagnosis not present

## 2020-08-12 DIAGNOSIS — N1832 Chronic kidney disease, stage 3b: Secondary | ICD-10-CM | POA: Diagnosis not present

## 2020-08-12 DIAGNOSIS — I129 Hypertensive chronic kidney disease with stage 1 through stage 4 chronic kidney disease, or unspecified chronic kidney disease: Secondary | ICD-10-CM | POA: Diagnosis not present

## 2020-08-12 DIAGNOSIS — E1121 Type 2 diabetes mellitus with diabetic nephropathy: Secondary | ICD-10-CM | POA: Diagnosis not present

## 2020-08-12 DIAGNOSIS — M810 Age-related osteoporosis without current pathological fracture: Secondary | ICD-10-CM | POA: Diagnosis not present

## 2020-08-12 DIAGNOSIS — G459 Transient cerebral ischemic attack, unspecified: Secondary | ICD-10-CM | POA: Diagnosis not present

## 2020-08-12 DIAGNOSIS — E782 Mixed hyperlipidemia: Secondary | ICD-10-CM | POA: Diagnosis not present

## 2020-08-12 NOTE — Progress Notes (Signed)
Carelink Summary Report / Loop Recorder 

## 2020-08-19 DIAGNOSIS — H26491 Other secondary cataract, right eye: Secondary | ICD-10-CM | POA: Diagnosis not present

## 2020-08-19 DIAGNOSIS — Z794 Long term (current) use of insulin: Secondary | ICD-10-CM | POA: Diagnosis not present

## 2020-08-19 DIAGNOSIS — Z7984 Long term (current) use of oral hypoglycemic drugs: Secondary | ICD-10-CM | POA: Diagnosis not present

## 2020-08-19 DIAGNOSIS — H524 Presbyopia: Secondary | ICD-10-CM | POA: Diagnosis not present

## 2020-08-19 DIAGNOSIS — E119 Type 2 diabetes mellitus without complications: Secondary | ICD-10-CM | POA: Diagnosis not present

## 2020-08-19 DIAGNOSIS — Z961 Presence of intraocular lens: Secondary | ICD-10-CM | POA: Diagnosis not present

## 2020-08-20 DIAGNOSIS — I700 Atherosclerosis of aorta: Secondary | ICD-10-CM | POA: Diagnosis not present

## 2020-08-20 DIAGNOSIS — G40909 Epilepsy, unspecified, not intractable, without status epilepticus: Secondary | ICD-10-CM | POA: Diagnosis not present

## 2020-08-20 DIAGNOSIS — E1151 Type 2 diabetes mellitus with diabetic peripheral angiopathy without gangrene: Secondary | ICD-10-CM | POA: Diagnosis not present

## 2020-08-20 DIAGNOSIS — M25552 Pain in left hip: Secondary | ICD-10-CM | POA: Diagnosis not present

## 2020-08-20 DIAGNOSIS — R269 Unspecified abnormalities of gait and mobility: Secondary | ICD-10-CM | POA: Diagnosis not present

## 2020-08-20 DIAGNOSIS — E222 Syndrome of inappropriate secretion of antidiuretic hormone: Secondary | ICD-10-CM | POA: Diagnosis not present

## 2020-08-20 DIAGNOSIS — E1142 Type 2 diabetes mellitus with diabetic polyneuropathy: Secondary | ICD-10-CM | POA: Diagnosis not present

## 2020-08-20 DIAGNOSIS — N1832 Chronic kidney disease, stage 3b: Secondary | ICD-10-CM | POA: Diagnosis not present

## 2020-08-20 DIAGNOSIS — E782 Mixed hyperlipidemia: Secondary | ICD-10-CM | POA: Diagnosis not present

## 2020-08-20 DIAGNOSIS — I129 Hypertensive chronic kidney disease with stage 1 through stage 4 chronic kidney disease, or unspecified chronic kidney disease: Secondary | ICD-10-CM | POA: Diagnosis not present

## 2020-08-26 DIAGNOSIS — G8929 Other chronic pain: Secondary | ICD-10-CM | POA: Diagnosis not present

## 2020-08-26 DIAGNOSIS — M25551 Pain in right hip: Secondary | ICD-10-CM | POA: Diagnosis not present

## 2020-08-26 DIAGNOSIS — M5442 Lumbago with sciatica, left side: Secondary | ICD-10-CM | POA: Diagnosis not present

## 2020-08-26 DIAGNOSIS — M25552 Pain in left hip: Secondary | ICD-10-CM | POA: Diagnosis not present

## 2020-08-28 ENCOUNTER — Ambulatory Visit: Payer: Self-pay | Admitting: Allergy

## 2020-08-28 ENCOUNTER — Telehealth: Payer: Self-pay

## 2020-08-28 NOTE — Telephone Encounter (Signed)
Called and talked to patient to inform her of her missed appointment. We rescheduled her appointment. Patient said that she is having pain in her left nostril is sore. [place] the first week in January and is not sure if it is still sore due to fall.

## 2020-08-31 LAB — CUP PACEART REMOTE DEVICE CHECK
Date Time Interrogation Session: 20220210230406
Implantable Pulse Generator Implant Date: 20210203

## 2020-09-02 ENCOUNTER — Ambulatory Visit (INDEPENDENT_AMBULATORY_CARE_PROVIDER_SITE_OTHER): Payer: Medicare HMO

## 2020-09-02 DIAGNOSIS — I6389 Other cerebral infarction: Secondary | ICD-10-CM

## 2020-09-04 DIAGNOSIS — H26491 Other secondary cataract, right eye: Secondary | ICD-10-CM | POA: Diagnosis not present

## 2020-09-06 NOTE — Progress Notes (Signed)
Carelink Summary Report / Loop Recorder 

## 2020-09-09 DIAGNOSIS — N1832 Chronic kidney disease, stage 3b: Secondary | ICD-10-CM | POA: Diagnosis not present

## 2020-09-09 DIAGNOSIS — E1121 Type 2 diabetes mellitus with diabetic nephropathy: Secondary | ICD-10-CM | POA: Diagnosis not present

## 2020-09-09 DIAGNOSIS — E039 Hypothyroidism, unspecified: Secondary | ICD-10-CM | POA: Diagnosis not present

## 2020-09-09 DIAGNOSIS — E1142 Type 2 diabetes mellitus with diabetic polyneuropathy: Secondary | ICD-10-CM | POA: Diagnosis not present

## 2020-09-09 DIAGNOSIS — I129 Hypertensive chronic kidney disease with stage 1 through stage 4 chronic kidney disease, or unspecified chronic kidney disease: Secondary | ICD-10-CM | POA: Diagnosis not present

## 2020-09-09 DIAGNOSIS — E1151 Type 2 diabetes mellitus with diabetic peripheral angiopathy without gangrene: Secondary | ICD-10-CM | POA: Diagnosis not present

## 2020-09-09 DIAGNOSIS — M810 Age-related osteoporosis without current pathological fracture: Secondary | ICD-10-CM | POA: Diagnosis not present

## 2020-09-09 DIAGNOSIS — E782 Mixed hyperlipidemia: Secondary | ICD-10-CM | POA: Diagnosis not present

## 2020-09-20 ENCOUNTER — Telehealth: Payer: Self-pay | Admitting: *Deleted

## 2020-09-20 NOTE — Telephone Encounter (Signed)
Pt called with compliant's of urinary frequency,dysuria, as well as odor. Pt will schedule an appointment for treatment

## 2020-09-23 ENCOUNTER — Ambulatory Visit: Payer: Medicare HMO | Admitting: Nurse Practitioner

## 2020-09-23 DIAGNOSIS — M25551 Pain in right hip: Secondary | ICD-10-CM | POA: Diagnosis not present

## 2020-09-23 NOTE — Progress Notes (Deleted)
GYNECOLOGY  VISIT  CC:   ***  HPI: 87 y.o. [id number] Divorced Black or [place] American female here for ***.     GYNECOLOGIC HISTORY: No LMP recorded. Patient has had a hysterectomy. Contraception: *** Menopausal hormone therapy: ***  Patient Active Problem List   Diagnosis Date Noted  . Allergic conjunctivitis of both eyes 03/15/2020  . PAD (peripheral artery disease) ([place]) 10/24/2019  . Malnutrition ([place]) 10/10/2019  . Seizures ([place]) with prolonged postictal period 10/08/2019  . Acute metabolic encephalopathy 40/04/2724  . Right shoulder pain 08/21/2019  . Leukocytosis 08/21/2019  . Acute encephalopathy 08/18/2019  . Enterocolitis 08/18/2019  . Hyperglycemia 08/18/2019  . Chronic kidney disease   . Pressure injury of skin 12/09/2016  . SBO (small bowel obstruction) s/p lysis of adhesions 12/03/2016 12/04/2016  . Acute urinary retention 11/27/2016  . Ascites 11/27/2016  . Abdominal distension   . Facial droop   . Hyperkalemia 11/25/2016  . Sepsis ([place]) 11/25/2016  . Acute kidney injury superimposed on chronic kidney disease ([place]) 11/25/2016  . Delirium 11/25/2016  . Age-related osteoporosis without current pathological fracture 10/01/2016  . Hyperlipidemia 06/10/2016  . GERD (gastroesophageal reflux disease) 06/10/2016  . Insomnia 06/10/2016  . Viral gastroenteritis   . Right hip pain 05/06/2015  . Vaginal atrophy 11/01/2013  . ECZEMA 07/24/2008  . INTERMITTENT VERTIGO 07/24/2008  . Constipation 11/16/2007  . INSOMNIA 08/12/2007  . Essential hypertension 02/14/2007  . Allergic rhinitis due to allergen 02/14/2007  . Hypothyroidism, acquired 01/26/2007  . Type 2 diabetes mellitus without complication, with long-term current use of insulin ([place]) 01/26/2007  . HYPERLIPIDEMIA 01/26/2007  . GERD 01/26/2007  . DEGENERATIVE JOINT DISEASE 01/26/2007    Past Medical History:  Diagnosis Date  . Arthritis    "knees, legs" (06/10/2016)  . Ascites   . Chronic kidney  disease    "related to my diabetes"  . Chronic lower back pain   . Diabetes mellitus without complication ([place])   . Eczema   . GERD (gastroesophageal reflux disease)   . High cholesterol   . Hyperlipidemia   . Hypertension   . Hypothyroidism   . OAB (overactive bladder)   . Osteoporosis   . Thyroid disease   . Type II diabetes mellitus ([place])   . Urticaria     Past Surgical History:  Procedure Laterality Date  . ABDOMINAL HYSTERECTOMY    . KNEE ARTHROSCOPY    . LAPAROTOMY N/A 12/03/2016   Procedure: EXPLORATORY LAPAROTOMY, LYSIS OF ADHESIONS;  Surgeon: Armandina Gemma, MD;  Location: [facility];  Service: General;  Laterality: N/A;  . LOOP RECORDER INSERTION N/A 08/23/2019   Procedure: LOOP RECORDER INSERTION;  Surgeon: Evans Lance, MD;  Location: [facility];  Service: Cardiovascular;  Laterality: N/A;  . vocal cord polyps      MEDS:   Current Outpatient Medications on File Prior to Visit  Medication Sig Dispense Refill  . acetaminophen (TYLENOL) 500 MG tablet Take 500-1,000 mg by mouth at bedtime as needed for mild pain.     Marland Kitchen aspirin EC 81 MG tablet Take 81 mg by mouth daily.    Marland Kitchen atorvastatin (LIPITOR) 40 MG tablet Take 40 mg by mouth daily at 6 PM.     . azelastine (ASTELIN) 0.1 % nasal spray Place 2 sprays into both nostrils 2 (two) times daily. Use in each nostril as directed 30 mL 2  . B-D UF III MINI PEN NEEDLES 31G X 5 MM MISC USE AS DIRECTED TID    .  calcium-vitamin D 250-100 MG-UNIT tablet Take 1 tablet by mouth daily.     . cetirizine (ZYRTEC) 10 MG tablet Take 10 mg by mouth daily as needed for allergies.    . cholecalciferol (VITAMIN D) 1000 units tablet Take 1,000 Units by mouth daily.    Marland Kitchen FLUAD 0.5 ML SUSY ADM 0.5ML IM UTD  0  . insulin aspart (NOVOLOG) 100 UNIT/ML injection Inject 4-12 Units into the skin See admin instructions. Inject 10-12 units at breakfast, 7-9 units at lunch and 4-6 units at dinner.    . Insulin Detemir (LEVEMIR FLEXTOUCH) 100 UNIT/ML  Pen Inject 4-6 Units into the skin See admin instructions. Inject 6 units in the morning and 4 units at bedtime.    . Iron-FA-B Cmp-C-Biot-Probiotic (FUSION PLUS) CAPS Take 1 capsule by mouth daily.     Marland Kitchen levETIRAcetam (KEPPRA) 500 MG tablet Take 1 tablet (500 mg total) by mouth 2 (two) times daily. (Patient not taking: Reported on 03/15/2020) 60 tablet 2  . losartan (COZAAR) 50 MG tablet Take 50 mg by mouth daily.     . Multiple Vitamin (MULTIVITAMIN WITH MINERALS) TABS tablet Take 1 tablet by mouth daily.    Marland Kitchen nystatin (MYCOSTATIN) 100000 UNIT/ML suspension Take 4 mLs by mouth at bedtime as needed.    . Omega-3 Fatty Acids (FISH OIL) 1000 MG CAPS Take 1,000 mg by mouth 2 (two) times daily.    . pantoprazole (PROTONIX) 40 MG tablet Take 40 mg by mouth daily.     . polyethylene glycol (MIRALAX / GLYCOLAX) 17 g packet Take 17 g by mouth daily as needed for mild constipation.    Marland Kitchen SYNTHROID 137 MCG tablet Take 137 mcg by mouth every morning.     No current facility-administered medications on file prior to visit.    ALLERGIES: Sulfonamide derivatives, Bactrim [sulfamethoxazole-trimethoprim], and Lisinopril  Family History  Problem Relation Age of Onset  . Diabetes Mother   . Hypertension Mother   . Diabetes Sister   . Diabetes Brother   . Asthma Father   . Allergic rhinitis Neg Hx   . Eczema Neg Hx   . Urticaria Neg Hx     SH:  ***  Review of Systems  PHYSICAL EXAMINATION:    There were no vitals taken for this visit.    General appearance: alert, cooperative, no acute distress  CV:  {Exam; heart brief:31539} Lungs:  {pe lungs ob:314451::"clear to auscultation, no wheezes, rales or rhonchi, symmetric air entry"} Breasts: {Exam; breast:13139::"normal appearance, no masses or tenderness"} Abdomen: soft, non-tender; bowel sounds normal; no masses,  no organomegaly Lymph:  no inguinal LAD noted  Pelvic: External genitalia:  no lesions              Urethra:  normal appearing  urethra with no masses, tenderness or lesions              Bartholins and Skenes: normal                 Vagina: normal appearing vagina               Cervix: {CHL AMB PHY EX CERVIX NORM DEFAULT:[phone]::"no lesions"}              Bimanual Exam:  Uterus:  {CHL AMB PHY EX UTERUS NORM [phone]::"normal size, contour, position, consistency, mobility, non-tender"}              Adnexa: {CHL AMB PHY EX ADNEXA NO MASS [phone]::"no mass, fullness, tenderness"}  Chaperone, ***, CMA, was present for exam.  Assessment: ***  Plan: ***   {NUMBERS; -10-45 JOINT ROM:10287} minutes of total time was spent for this patient encounter, including preparation, face-to-face counseling with the patient and coordination of care, and documentation of the encounter.

## 2020-09-23 NOTE — Progress Notes (Signed)
GYNECOLOGY  VISIT  CC:   Takes a long time to urinate and when she does it hurts. Reports back pain and hurts down legs. Feels burning with urination. Using estrogen vaginal cream 3 times per week and helps a little.  Pain in abdomen and top of the vagina started in July 2021 and increasingly getting worse. Scale of 1-10 pain is 4-5.  Reports sometimes has bowel movements every 2-3 days and it hurts to have bowel movements  S/P Total Hysterectomy Not sexually active  HPI: 87 y.o. [id number] Divorced Black or [place] American female here for uti.    GYNECOLOGIC HISTORY: No LMP recorded. Patient has had a hysterectomy. Contraception: hysterectomy Menopausal hormone therapy: premarin vaginal cream 3 times a week  Patient Active Problem List   Diagnosis Date Noted  . Allergic conjunctivitis of both eyes 03/15/2020  . PAD (peripheral artery disease) ([place]) 10/24/2019  . Malnutrition ([place]) 10/10/2019  . Seizures ([place]) with prolonged postictal period 10/08/2019  . Acute metabolic encephalopathy 78/24/2353  . Right shoulder pain 08/21/2019  . Leukocytosis 08/21/2019  . Acute encephalopathy 08/18/2019  . Enterocolitis 08/18/2019  . Hyperglycemia 08/18/2019  . Chronic kidney disease   . Pressure injury of skin 12/09/2016  . SBO (small bowel obstruction) s/p lysis of adhesions 12/03/2016 12/04/2016  . Acute urinary retention 11/27/2016  . Ascites 11/27/2016  . Abdominal distension   . Facial droop   . Hyperkalemia 11/25/2016  . Sepsis ([place]) 11/25/2016  . Acute kidney injury superimposed on chronic kidney disease ([place]) 11/25/2016  . Delirium 11/25/2016  . Age-related osteoporosis without current pathological fracture 10/01/2016  . Hyperlipidemia 06/10/2016  . GERD (gastroesophageal reflux disease) 06/10/2016  . Insomnia 06/10/2016  . Viral gastroenteritis   . Right hip pain 05/06/2015  . Vaginal atrophy 11/01/2013  . ECZEMA 07/24/2008  . INTERMITTENT VERTIGO 07/24/2008  .  Constipation 11/16/2007  . INSOMNIA 08/12/2007  . Essential hypertension 02/14/2007  . Allergic rhinitis due to allergen 02/14/2007  . Hypothyroidism, acquired 01/26/2007  . Type 2 diabetes mellitus without complication, with long-term current use of insulin ([place]) 01/26/2007  . HYPERLIPIDEMIA 01/26/2007  . GERD 01/26/2007  . DEGENERATIVE JOINT DISEASE 01/26/2007    Past Medical History:  Diagnosis Date  . Arthritis    "knees, legs" (06/10/2016)  . Ascites   . Chronic kidney disease    "related to my diabetes"  . Chronic lower back pain   . Diabetes mellitus without complication ([place])   . Eczema   . GERD (gastroesophageal reflux disease)   . High cholesterol   . Hyperlipidemia   . Hypertension   . Hypothyroidism   . OAB (overactive bladder)   . Osteoporosis   . Thyroid disease   . Type II diabetes mellitus ([place])   . Urticaria     Past Surgical History:  Procedure Laterality Date  . ABDOMINAL HYSTERECTOMY    . KNEE ARTHROSCOPY    . LAPAROTOMY N/A 12/03/2016   Procedure: EXPLORATORY LAPAROTOMY, LYSIS OF ADHESIONS;  Surgeon: Armandina Gemma, MD;  Location: [facility];  Service: General;  Laterality: N/A;  . LOOP RECORDER INSERTION N/A 08/23/2019   Procedure: LOOP RECORDER INSERTION;  Surgeon: Evans Lance, MD;  Location: [facility];  Service: Cardiovascular;  Laterality: N/A;  . vocal cord polyps      MEDS:   Current Outpatient Medications on File Prior to Visit  Medication Sig Dispense Refill  . acetaminophen (TYLENOL) 500 MG tablet Take 500-1,000 mg by mouth at bedtime as needed for  mild pain.     Marland Kitchen amLODipine (NORVASC) 2.5 MG tablet 1 tablet    . aspirin EC 81 MG tablet Take 81 mg by mouth daily.    Marland Kitchen atorvastatin (LIPITOR) 40 MG tablet Take 40 mg by mouth daily at 6 PM.     . azelastine (ASTELIN) 0.1 % nasal spray Place 2 sprays into both nostrils 2 (two) times daily. Use in each nostril as directed 30 mL 2  . B-D UF III MINI PEN NEEDLES 31G X 5 MM MISC USE AS  DIRECTED TID    . calcium-vitamin D 250-100 MG-UNIT tablet Take 1 tablet by mouth daily.     . cetirizine (ZYRTEC) 10 MG tablet Take 10 mg by mouth daily as needed for allergies.    . cholecalciferol (VITAMIN D) 1000 units tablet Take 1,000 Units by mouth daily.    Marland Kitchen conjugated estrogens (PREMARIN) vaginal cream See admin instructions.    . famotidine (PEPCID) 20 MG tablet 1 tablet at bedtime as needed    . insulin aspart (NOVOLOG FLEXPEN) 100 UNIT/ML FlexPen 8 to 10 units at breakfast 4 to 6 units at lunch and 2 to 4 units evening meal    . insulin detemir (LEVEMIR FLEXTOUCH) 100 UNIT/ML FlexPen 7 units each morning, 3 units each evening    . Iron-FA-B Cmp-C-Biot-Probiotic (FUSION PLUS) CAPS Take 1 capsule by mouth daily.     Marland Kitchen levETIRAcetam (KEPPRA) 500 MG tablet Take 1 tablet (500 mg total) by mouth 2 (two) times daily. 60 tablet 2  . levothyroxine (SYNTHROID) 150 MCG tablet 1 tablet in the morning on an empty stomach    . losartan (COZAAR) 50 MG tablet Take 50 mg by mouth daily.     . mometasone (NASONEX) 50 MCG/ACT nasal spray 2 sprays in each nostril    . Multiple Vitamin (MULTIVITAMIN WITH MINERALS) TABS tablet Take 1 tablet by mouth daily.    . Multiple Vitamins-Minerals (MULTI VITAMIN/MINERALS) TABS 1 tablet    . nystatin (MYCOSTATIN) 100000 UNIT/ML suspension Take 4 mLs by mouth at bedtime as needed.    . Omega-3 Fatty Acids (FISH OIL) 1000 MG CAPS Take 1,000 mg by mouth 2 (two) times daily.    Glory Rosebush Delica Lancets 40J MISC IC-10 CODE:  E11.65  Check blood sugar    . ONETOUCH VERIO test strip 1 each 3 (three) times daily.    . pantoprazole (PROTONIX) 40 MG tablet Take 40 mg by mouth daily.     . polyethylene glycol (MIRALAX / GLYCOLAX) 17 g packet Take 17 g by mouth daily as needed for mild constipation.     No current facility-administered medications on file prior to visit.    ALLERGIES: Sulfonamide derivatives, Bactrim [sulfamethoxazole-trimethoprim], and  Lisinopril  Family History  Problem Relation Age of Onset  . Diabetes Mother   . Hypertension Mother   . Diabetes Sister   . Diabetes Brother   . Asthma Father   . Allergic rhinitis Neg Hx   . Eczema Neg Hx   . Urticaria Neg Hx     Review of Systems  Constitutional: Negative.   HENT: Negative.   Eyes: Negative.   Respiratory: Negative.   Cardiovascular: Negative.   Gastrointestinal: Negative.   Endocrine: Negative.   Genitourinary: Negative.   Musculoskeletal: Negative.   Skin: Negative.   Allergic/Immunologic: Negative.   Neurological: Negative.   Hematological: Negative.   Psychiatric/Behavioral: Negative.     PHYSICAL EXAMINATION:    BP 124/74   Pulse 70  Temp 98.5 F (36.9 C) (Oral)   Resp 16   Wt 141 lb (64 kg)   BMI 24.13 kg/m     General appearance: alert, cooperative, no acute distress  Abdomen: soft, mildly tender;  no masses,  no organomegaly Lymph:  no inguinal LAD noted  Pelvic: External genitalia:  no lesions, mild edema              Urethra:  normal appearing urethra with no masses, tenderness or lesions              Bartholins and Skenes: normal                 Vagina: normal appearing vagina, minimal discharge, ph 5.0, mild edema, no skin break down              Cervix: absent              Bimanual Exam:  Uterus:  uterus absent              Adnexa: no mass, fullness, tenderness               Chaperone, Joy, CMA, was present for exam.  Assessment/Plan:  Yeast vaginitis - Plan: terconazole (TERAZOL 7) 0.4 % vaginal cream (cream per patient request)  Pain with urination - Plan: Urinalysis,Complete w/RFL Culture. Advised it does not seem like a bladder infection  Constipation: Encouraged daily metamucil fiber with increased water intake.

## 2020-09-26 ENCOUNTER — Ambulatory Visit: Payer: Medicare HMO | Admitting: Nurse Practitioner

## 2020-09-26 ENCOUNTER — Encounter: Payer: Self-pay | Admitting: Nurse Practitioner

## 2020-09-26 ENCOUNTER — Other Ambulatory Visit: Payer: Self-pay

## 2020-09-26 VITALS — BP 124/74 | HR 70 | Temp 98.5°F | Resp 16 | Wt 141.0 lb

## 2020-09-26 DIAGNOSIS — B373 Candidiasis of vulva and vagina: Secondary | ICD-10-CM | POA: Diagnosis not present

## 2020-09-26 DIAGNOSIS — R309 Painful micturition, unspecified: Secondary | ICD-10-CM

## 2020-09-26 DIAGNOSIS — B3731 Acute candidiasis of vulva and vagina: Secondary | ICD-10-CM

## 2020-09-26 LAB — WET PREP FOR TRICH, YEAST, CLUE

## 2020-09-26 LAB — URINALYSIS, COMPLETE W/RFL CULTURE
Bacteria, UA: NONE SEEN /HPF
Bilirubin Urine: NEGATIVE
Hgb urine dipstick: NEGATIVE
Hyaline Cast: NONE SEEN /LPF
Ketones, ur: NEGATIVE
Leukocyte Esterase: NEGATIVE
Nitrites, Initial: NEGATIVE
RBC / HPF: NONE SEEN /HPF (ref 0–2)
Specific Gravity, Urine: 1.01 (ref 1.001–1.03)
WBC, UA: NONE SEEN /HPF (ref 0–5)
pH: 6.5 (ref 5.0–8.0)

## 2020-09-26 LAB — NO CULTURE INDICATED

## 2020-09-26 MED ORDER — TERCONAZOLE 0.4 % VA CREA: 1.0000 | TOPICAL_CREAM | Freq: Every day | VAGINAL | 0 refills | Status: DC

## 2020-09-26 NOTE — Patient Instructions (Signed)
Please use Terazol vaginal cream to treat your yeast infection. Use for a full 7 days, even if you start to feel better, continue the treatment until it's gone. Diabetes increases risk of yeast infection. Work with your primary care to assure your blood sugar is in good control  I recommend taking metamucil fiber daily, see if that helps your abdominal pain. If you do not get relief, please follow up with your primary care.  Vaginal Yeast Infection, Adult  Vaginal yeast infection is a condition that causes vaginal discharge as well as soreness, swelling, and redness (inflammation) of the vagina. This is a common condition. Some women get this infection frequently. What are the causes? This condition is caused by a change in the normal balance of the yeast (candida) and bacteria that live in the vagina. This change causes an overgrowth of yeast, which causes the inflammation. What increases the risk? The condition is more likely to develop in women who:  Take antibiotic medicines.  Have diabetes.  Take birth control pills.  Are pregnant.  Douche often.  Have a weak body defense system (immune system).  Have been taking steroid medicines for a long time.  Frequently wear tight clothing. What are the signs or symptoms? Symptoms of this condition include:  White, thick, creamy vaginal discharge.  Swelling, itching, redness, and irritation of the vagina. The lips of the vagina (vulva) may be affected as well.  Pain or a burning feeling while urinating.  Pain during sex. How is this diagnosed? This condition is diagnosed based on:  Your medical history.  A physical exam.  A pelvic exam. Your health care provider will examine a sample of your vaginal discharge under a microscope. Your health care provider may send this sample for testing to confirm the diagnosis. How is this treated? This condition is treated with medicine. Medicines may be over-the-counter or prescription. You  may be told to use one or more of the following:  Medicine that is taken by mouth (orally).  Medicine that is applied as a cream (topically).  Medicine that is inserted directly into the vagina (suppository). Follow these instructions at home: Lifestyle  Do not have sex until your health care provider approves. Tell your sex partner that you have a yeast infection. That person should go to his or her health care provider and ask if they should also be treated.  Do not wear tight clothes, such as pantyhose or tight pants.  Wear breathable cotton underwear. General instructions  Take or apply over-the-counter and prescription medicines only as told by your health care provider.  Eat more yogurt. This may help to keep your yeast infection from returning.  Do not use tampons until your health care provider approves.  Try taking a sitz bath to help with discomfort. This is a warm water bath that is taken while you are sitting down. The water should only come up to your hips and should cover your buttocks. Do this 3-4 times per day or as told by your health care provider.  Do not douche.  If you have diabetes, keep your blood sugar levels under control.  Keep all follow-up visits as told by your health care provider. This is important.   Contact a health care provider if:  You have a fever.  Your symptoms go away and then return.  Your symptoms do not get better with treatment.  Your symptoms get worse.  You have new symptoms.  You develop blisters in or around your  vagina.  You have blood coming from your vagina and it is not your menstrual period.  You develop pain in your abdomen. Summary  Vaginal yeast infection is a condition that causes discharge as well as soreness, swelling, and redness (inflammation) of the vagina.  This condition is treated with medicine. Medicines may be over-the-counter or prescription.  Take or apply over-the-counter and prescription  medicines only as told by your health care provider.  Do not douche. Do not have sex or use tampons until your health care provider approves.  Contact a health care provider if your symptoms do not get better with treatment or your symptoms go away and then return. This information is not intended to replace advice given to you by your health care provider. Make sure you discuss any questions you have with your health care provider. Document Revised: 02/03/2019 Document Reviewed: 11/22/2017 Elsevier Patient Education  [place].

## 2020-09-26 NOTE — Addendum Note (Signed)
Addended by: Donivan Scull on: 09/26/2020 11:18 AM   Modules accepted: Orders

## 2020-09-27 DIAGNOSIS — I700 Atherosclerosis of aorta: Secondary | ICD-10-CM | POA: Diagnosis not present

## 2020-09-27 DIAGNOSIS — I129 Hypertensive chronic kidney disease with stage 1 through stage 4 chronic kidney disease, or unspecified chronic kidney disease: Secondary | ICD-10-CM | POA: Diagnosis not present

## 2020-09-27 DIAGNOSIS — Z1389 Encounter for screening for other disorder: Secondary | ICD-10-CM | POA: Diagnosis not present

## 2020-09-27 DIAGNOSIS — Z0000 Encounter for general adult medical examination without abnormal findings: Secondary | ICD-10-CM | POA: Diagnosis not present

## 2020-09-27 DIAGNOSIS — Z79899 Other long term (current) drug therapy: Secondary | ICD-10-CM | POA: Diagnosis not present

## 2020-09-27 DIAGNOSIS — Z1331 Encounter for screening for depression: Secondary | ICD-10-CM | POA: Diagnosis not present

## 2020-09-27 DIAGNOSIS — E1142 Type 2 diabetes mellitus with diabetic polyneuropathy: Secondary | ICD-10-CM | POA: Diagnosis not present

## 2020-09-27 DIAGNOSIS — E782 Mixed hyperlipidemia: Secondary | ICD-10-CM | POA: Diagnosis not present

## 2020-09-27 DIAGNOSIS — G40909 Epilepsy, unspecified, not intractable, without status epilepticus: Secondary | ICD-10-CM | POA: Diagnosis not present

## 2020-09-27 DIAGNOSIS — E1121 Type 2 diabetes mellitus with diabetic nephropathy: Secondary | ICD-10-CM | POA: Diagnosis not present

## 2020-09-27 DIAGNOSIS — E039 Hypothyroidism, unspecified: Secondary | ICD-10-CM | POA: Diagnosis not present

## 2020-10-03 LAB — CUP PACEART REMOTE DEVICE CHECK
Date Time Interrogation Session: 20220315230534
Implantable Pulse Generator Implant Date: 20210203

## 2020-10-07 ENCOUNTER — Ambulatory Visit (INDEPENDENT_AMBULATORY_CARE_PROVIDER_SITE_OTHER): Payer: Medicare HMO

## 2020-10-07 DIAGNOSIS — I6389 Other cerebral infarction: Secondary | ICD-10-CM

## 2020-10-15 NOTE — Progress Notes (Signed)
Carelink Summary Report / Loop Recorder 

## 2020-10-17 ENCOUNTER — Ambulatory Visit: Payer: Medicare HMO | Admitting: Allergy

## 2020-10-17 ENCOUNTER — Other Ambulatory Visit: Payer: Self-pay

## 2020-10-17 ENCOUNTER — Encounter: Payer: Self-pay | Admitting: Allergy

## 2020-10-17 VITALS — BP 132/60 | HR 68 | Temp 97.2°F | Resp 16 | Ht 65.0 in | Wt 142.0 lb

## 2020-10-17 DIAGNOSIS — H1013 Acute atopic conjunctivitis, bilateral: Secondary | ICD-10-CM | POA: Diagnosis not present

## 2020-10-17 DIAGNOSIS — J3089 Other allergic rhinitis: Secondary | ICD-10-CM | POA: Diagnosis not present

## 2020-10-17 MED ORDER — MONTELUKAST SODIUM 10 MG PO TABS
10.0000 mg | ORAL_TABLET | Freq: Every day | ORAL | 5 refills | Status: DC
Start: 2020-10-17 — End: 2022-01-30

## 2020-10-17 NOTE — Patient Instructions (Addendum)
Allergic rhinitis -Continue allergen avoidance measures directed toward dust mite, mold, and cockroach as listed below -Continue Flonase 2 sprays in each nostril once a day for 1 week and stop if congestion is improved.  If congestion return start again for another week and stop and use that way going forward for maximum benefit. In the right nostril, point the applicator out toward the right ear. In the left nostril, point the applicator out toward the left ear -Start Singulair 10mg  daily at bedtime.  This is not an antihistamine thus should not worsen dry mouth or nose.  This is an antileukotriene that can be effective in allergy symptom control.  If you notice any change in mood/behavior/sleep after starting Singulair then stop this medication and let us know.  Symptoms resolve after stopping the medication.   -Use saline nasal rinses once a day or more for nasal symptoms. Use this before any medicated nasal sprays for best result  Allergic conjunctivitis -Some over the counter eye drops include Pataday one drop in each eye once a day as needed for itchy/watery itchy eyes OR Zaditor one drop in each eye twice a day as needed for  Itchy/water  eyes.  Call the clinic if this treatment plan is not working well for you  Follow up in 4-6 months or sooner if needed.

## 2020-10-17 NOTE — Progress Notes (Signed)
Follow-up Note  RE: [name] [name] MRN: [id number] DOB: 23-Feb-1934 Date of Office Visit: 10/17/2020   History of present illness: [name] [name] is a 87 y.o. female presenting today for follow-up of allergic rhinitis with conjunctivitis.  She was last seen in the office on 03/15/20 by our nurse practitioner Ambs.    She reports both ears ache, nasal ache as well as sinus pressure around the eyes. Also reports some sneezing and itchy/watery eyes too. She also states has been having dry mouth and nose.  She has been noting these symptoms for the past 2-3 months and states can be worse depending on the day.  She is using flonase 2 sprays each nostril as needed when she feels "stopped up".   She is not taking any antihistamines at this time.  She states she uses Systane for dry eyes and does also use if eyes are itchy/watery too and feels helps.    Review of systems in the past 4 weeks: Review of Systems  Constitutional: Negative.   HENT:       See HPI  Eyes: Negative.   Respiratory: Negative.   Cardiovascular: Negative.   Gastrointestinal: Negative.   Musculoskeletal: Negative.   Skin: Negative.   Neurological: Negative.     All other systems negative unless noted above in HPI  Past medical/social/surgical/family history have been reviewed and are unchanged unless specifically indicated below.  No changes  Medication List: Current Outpatient Medications  Medication Sig Dispense Refill  . acetaminophen (TYLENOL) 500 MG tablet Take 500-1,000 mg by mouth at bedtime as needed for mild pain.     Marland Kitchen amLODipine (NORVASC) 2.5 MG tablet 1 tablet    . aspirin EC 81 MG tablet Take 81 mg by mouth daily.    Marland Kitchen atorvastatin (LIPITOR) 40 MG tablet Take 40 mg by mouth daily at 6 PM.     . azelastine (ASTELIN) 0.1 % nasal spray Place 2 sprays into both nostrils 2 (two) times daily. Use in each nostril as directed 30 mL 2  . B-D UF III MINI PEN NEEDLES 31G X 5 MM MISC USE AS DIRECTED TID     . calcium-vitamin D 250-100 MG-UNIT tablet Take 1 tablet by mouth daily.     . cetirizine (ZYRTEC) 10 MG tablet Take 10 mg by mouth daily as needed for allergies.    . cholecalciferol (VITAMIN D) 1000 units tablet Take 1,000 Units by mouth daily.    Marland Kitchen conjugated estrogens (PREMARIN) vaginal cream See admin instructions.    . famotidine (PEPCID) 20 MG tablet 1 tablet at bedtime as needed    . insulin aspart (NOVOLOG FLEXPEN) 100 UNIT/ML FlexPen 8 to 10 units at breakfast 4 to 6 units at lunch and 2 to 4 units evening meal    . insulin detemir (LEVEMIR FLEXTOUCH) 100 UNIT/ML FlexPen 7 units each morning, 3 units each evening    . Iron-FA-B Cmp-C-Biot-Probiotic (FUSION PLUS) CAPS Take 1 capsule by mouth daily.     Marland Kitchen levETIRAcetam (KEPPRA) 500 MG tablet Take 1 tablet (500 mg total) by mouth 2 (two) times daily. 60 tablet 2  . levothyroxine (SYNTHROID) 150 MCG tablet 1 tablet in the morning on an empty stomach    . losartan (COZAAR) 50 MG tablet Take 50 mg by mouth daily.     . mometasone (NASONEX) 50 MCG/ACT nasal spray 2 sprays in each nostril    . montelukast (SINGULAIR) 10 MG tablet Take 1 tablet (10 mg total) by mouth  at bedtime. 30 tablet 5  . Multiple Vitamin (MULTIVITAMIN WITH MINERALS) TABS tablet Take 1 tablet by mouth daily.    . Multiple Vitamins-Minerals (MULTI VITAMIN/MINERALS) TABS 1 tablet    . nystatin (MYCOSTATIN) 100000 UNIT/ML suspension Take 4 mLs by mouth at bedtime as needed.    . Omega-3 Fatty Acids (FISH OIL) 1000 MG CAPS Take 1,000 mg by mouth 2 (two) times daily.    Glory Rosebush Delica Lancets 50Y MISC IC-10 CODE:  E11.65  Check blood sugar    . ONETOUCH VERIO test strip 1 each 3 (three) times daily.    . pantoprazole (PROTONIX) 40 MG tablet Take 40 mg by mouth daily.     . polyethylene glycol (MIRALAX / GLYCOLAX) 17 g packet Take 17 g by mouth daily as needed for mild constipation.    Marland Kitchen terconazole (TERAZOL 7) 0.4 % vaginal cream Place 1 applicator vaginally at bedtime. 45  g 0   No current facility-administered medications for this visit.     Known medication allergies: Allergies  Allergen Reactions  . Sulfonamide Derivatives Hives  . Bactrim [Sulfamethoxazole-Trimethoprim] Rash    Reported by [facility]  . Lisinopril Cough    Reported by [facility]     Physical examination: Blood pressure 132/60, pulse 68, temperature (!) 97.2 F (36.2 C), temperature source Temporal, resp. rate 16, height 5\' 5"  (1.651 m), weight 142 lb (64.4 kg), SpO2 98 %.  General: Alert, interactive, in no acute distress. HEENT: PERRLA, TMs pearly gray, turbinates minimally edematous without discharge, post-pharynx non erythematous. Neck: Supple without lymphadenopathy. Lungs: Clear to auscultation without wheezing, rhonchi or rales. {no increased work of breathing. CV: Normal S1, S2 without murmurs. Abdomen: Nondistended, nontender. Skin: Warm and dry, without lesions or rashes. Extremities:  No clubbing, cyanosis or edema. Neuro:   Grossly intact.  Diagnositics/Labs: None today  Assessment and plan:   Allergic rhinitis -Continue allergen avoidance measures directed toward dust mite, mold, and cockroach as listed below -Continue Flonase 2 sprays in each nostril once a day for 1 week and stop if congestion is improved.  If congestion return start again for another week and stop and use that way going forward for maximum benefit. In the right nostril, point the applicator out toward the right ear. In the left nostril, point the applicator out toward the left ear -Start Singulair 10mg  daily at bedtime.  This is not an antihistamine thus should not worsen dry mouth or nose.  This is an antileukotriene that can be effective in allergy symptom control.  If you notice any change in mood/behavior/sleep after starting Singulair then stop this medication and let us know.  Symptoms resolve after stopping the medication.   -Use saline nasal rinses once a day or more for  nasal symptoms. Use this before any medicated nasal sprays for best result  Allergic conjunctivitis -Some over the counter eye drops include Pataday one drop in each eye once a day as needed for itchy/watery itchy eyes OR Zaditor one drop in each eye twice a day as needed for  Itchy/water  eyes.  Call the clinic if this treatment plan is not working well for you  Follow up in 4-6 months or sooner if needed. I appreciate the opportunity to take part in [name] [name]'s care. Please do not hesitate to contact me with questions.  Sincerely,   Prudy Feeler, MD Allergy/Immunology [facility]

## 2020-10-24 ENCOUNTER — Other Ambulatory Visit: Payer: Self-pay | Admitting: Nurse Practitioner

## 2020-10-24 DIAGNOSIS — B3731 Acute candidiasis of vulva and vagina: Secondary | ICD-10-CM

## 2020-10-24 DIAGNOSIS — B373 Candidiasis of vulva and vagina: Secondary | ICD-10-CM

## 2020-10-25 NOTE — Telephone Encounter (Signed)
Patient called requesting refill report itching and irritation when using bathroom. Reports it burns when she wiping with tissue. Office visit offered

## 2020-10-30 DIAGNOSIS — L603 Nail dystrophy: Secondary | ICD-10-CM | POA: Diagnosis not present

## 2020-10-30 DIAGNOSIS — Z794 Long term (current) use of insulin: Secondary | ICD-10-CM | POA: Diagnosis not present

## 2020-10-30 DIAGNOSIS — E1151 Type 2 diabetes mellitus with diabetic peripheral angiopathy without gangrene: Secondary | ICD-10-CM | POA: Diagnosis not present

## 2020-10-30 DIAGNOSIS — E1165 Type 2 diabetes mellitus with hyperglycemia: Secondary | ICD-10-CM | POA: Diagnosis not present

## 2020-10-30 DIAGNOSIS — I739 Peripheral vascular disease, unspecified: Secondary | ICD-10-CM | POA: Diagnosis not present

## 2020-10-30 DIAGNOSIS — E1140 Type 2 diabetes mellitus with diabetic neuropathy, unspecified: Secondary | ICD-10-CM | POA: Diagnosis not present

## 2020-10-30 DIAGNOSIS — L84 Corns and callosities: Secondary | ICD-10-CM | POA: Diagnosis not present

## 2020-10-30 DIAGNOSIS — E119 Type 2 diabetes mellitus without complications: Secondary | ICD-10-CM | POA: Diagnosis not present

## 2020-11-04 ENCOUNTER — Ambulatory Visit (INDEPENDENT_AMBULATORY_CARE_PROVIDER_SITE_OTHER): Payer: Medicare HMO

## 2020-11-04 DIAGNOSIS — I6389 Other cerebral infarction: Secondary | ICD-10-CM

## 2020-11-06 LAB — CUP PACEART REMOTE DEVICE CHECK
Date Time Interrogation Session: 20220417230620
Implantable Pulse Generator Implant Date: 20210203

## 2020-11-13 DIAGNOSIS — M170 Bilateral primary osteoarthritis of knee: Secondary | ICD-10-CM | POA: Diagnosis not present

## 2020-11-14 DIAGNOSIS — E039 Hypothyroidism, unspecified: Secondary | ICD-10-CM | POA: Diagnosis not present

## 2020-11-14 DIAGNOSIS — E1121 Type 2 diabetes mellitus with diabetic nephropathy: Secondary | ICD-10-CM | POA: Diagnosis not present

## 2020-11-14 DIAGNOSIS — M810 Age-related osteoporosis without current pathological fracture: Secondary | ICD-10-CM | POA: Diagnosis not present

## 2020-11-14 DIAGNOSIS — E1142 Type 2 diabetes mellitus with diabetic polyneuropathy: Secondary | ICD-10-CM | POA: Diagnosis not present

## 2020-11-14 DIAGNOSIS — I129 Hypertensive chronic kidney disease with stage 1 through stage 4 chronic kidney disease, or unspecified chronic kidney disease: Secondary | ICD-10-CM | POA: Diagnosis not present

## 2020-11-14 DIAGNOSIS — E782 Mixed hyperlipidemia: Secondary | ICD-10-CM | POA: Diagnosis not present

## 2020-11-14 DIAGNOSIS — E1151 Type 2 diabetes mellitus with diabetic peripheral angiopathy without gangrene: Secondary | ICD-10-CM | POA: Diagnosis not present

## 2020-11-14 DIAGNOSIS — N1832 Chronic kidney disease, stage 3b: Secondary | ICD-10-CM | POA: Diagnosis not present

## 2020-11-19 DIAGNOSIS — E1142 Type 2 diabetes mellitus with diabetic polyneuropathy: Secondary | ICD-10-CM | POA: Diagnosis not present

## 2020-11-19 DIAGNOSIS — Z794 Long term (current) use of insulin: Secondary | ICD-10-CM | POA: Diagnosis not present

## 2020-11-19 DIAGNOSIS — E039 Hypothyroidism, unspecified: Secondary | ICD-10-CM | POA: Diagnosis not present

## 2020-11-20 NOTE — Progress Notes (Signed)
Carelink Summary Report / Loop Recorder 

## 2020-11-23 DIAGNOSIS — R079 Chest pain, unspecified: Secondary | ICD-10-CM | POA: Diagnosis not present

## 2020-11-29 DIAGNOSIS — Z20822 Contact with and (suspected) exposure to covid-19: Secondary | ICD-10-CM | POA: Diagnosis not present

## 2020-12-09 ENCOUNTER — Ambulatory Visit (INDEPENDENT_AMBULATORY_CARE_PROVIDER_SITE_OTHER): Payer: Medicare HMO

## 2020-12-09 DIAGNOSIS — I6389 Other cerebral infarction: Secondary | ICD-10-CM | POA: Diagnosis not present

## 2020-12-10 LAB — CUP PACEART REMOTE DEVICE CHECK
Date Time Interrogation Session: 20220520230300
Implantable Pulse Generator Implant Date: 20210203

## 2020-12-12 DIAGNOSIS — E1142 Type 2 diabetes mellitus with diabetic polyneuropathy: Secondary | ICD-10-CM | POA: Diagnosis not present

## 2020-12-12 DIAGNOSIS — E039 Hypothyroidism, unspecified: Secondary | ICD-10-CM | POA: Diagnosis not present

## 2020-12-12 DIAGNOSIS — E1165 Type 2 diabetes mellitus with hyperglycemia: Secondary | ICD-10-CM | POA: Diagnosis not present

## 2020-12-12 DIAGNOSIS — Z794 Long term (current) use of insulin: Secondary | ICD-10-CM | POA: Diagnosis not present

## 2020-12-23 DIAGNOSIS — M542 Cervicalgia: Secondary | ICD-10-CM | POA: Diagnosis not present

## 2020-12-25 ENCOUNTER — Other Ambulatory Visit: Payer: Self-pay | Admitting: Sports Medicine

## 2020-12-25 DIAGNOSIS — M542 Cervicalgia: Secondary | ICD-10-CM

## 2020-12-25 DIAGNOSIS — M47812 Spondylosis without myelopathy or radiculopathy, cervical region: Secondary | ICD-10-CM | POA: Diagnosis not present

## 2020-12-31 NOTE — Progress Notes (Signed)
Carelink Summary Report / Loop Recorder 

## 2021-01-06 ENCOUNTER — Other Ambulatory Visit: Payer: Medicare HMO

## 2021-01-07 DIAGNOSIS — E782 Mixed hyperlipidemia: Secondary | ICD-10-CM | POA: Diagnosis not present

## 2021-01-07 DIAGNOSIS — I129 Hypertensive chronic kidney disease with stage 1 through stage 4 chronic kidney disease, or unspecified chronic kidney disease: Secondary | ICD-10-CM | POA: Diagnosis not present

## 2021-01-07 DIAGNOSIS — N1832 Chronic kidney disease, stage 3b: Secondary | ICD-10-CM | POA: Diagnosis not present

## 2021-01-07 DIAGNOSIS — E1142 Type 2 diabetes mellitus with diabetic polyneuropathy: Secondary | ICD-10-CM | POA: Diagnosis not present

## 2021-01-07 DIAGNOSIS — E1151 Type 2 diabetes mellitus with diabetic peripheral angiopathy without gangrene: Secondary | ICD-10-CM | POA: Diagnosis not present

## 2021-01-07 DIAGNOSIS — M810 Age-related osteoporosis without current pathological fracture: Secondary | ICD-10-CM | POA: Diagnosis not present

## 2021-01-07 DIAGNOSIS — E039 Hypothyroidism, unspecified: Secondary | ICD-10-CM | POA: Diagnosis not present

## 2021-01-07 DIAGNOSIS — E1121 Type 2 diabetes mellitus with diabetic nephropathy: Secondary | ICD-10-CM | POA: Diagnosis not present

## 2021-01-09 ENCOUNTER — Other Ambulatory Visit: Payer: Self-pay

## 2021-01-09 ENCOUNTER — Other Ambulatory Visit: Payer: Medicare HMO

## 2021-01-09 ENCOUNTER — Ambulatory Visit
Admission: RE | Admit: 2021-01-09 | Discharge: 2021-01-09 | Disposition: A | Discharge status: Home / Self Care | Payer: Medicare HMO | Source: Ambulatory Visit | Attending: Sports Medicine | Admitting: Sports Medicine

## 2021-01-09 DIAGNOSIS — M542 Cervicalgia: Secondary | ICD-10-CM

## 2021-01-09 DIAGNOSIS — M47812 Spondylosis without myelopathy or radiculopathy, cervical region: Secondary | ICD-10-CM | POA: Diagnosis not present

## 2021-01-09 LAB — CUP PACEART REMOTE DEVICE CHECK
Date Time Interrogation Session: 20220622230519
Implantable Pulse Generator Implant Date: 20210203

## 2021-01-09 MED ORDER — TRIAMCINOLONE ACETONIDE 40 MG/ML IJ SUSP (RADIOLOGY)
60.0000 mg | Freq: Once | INTRAMUSCULAR | Status: AC
  Administered 2021-01-09: 60 mg via EPIDURAL

## 2021-01-09 MED ORDER — IOPAMIDOL (ISOVUE-M 300) INJECTION 61%
1.0000 mL | Freq: Once | INTRAMUSCULAR | Status: AC
  Administered 2021-01-09: 1 mL via EPIDURAL

## 2021-01-09 NOTE — Discharge Instructions (Signed)

## 2021-01-13 ENCOUNTER — Ambulatory Visit (INDEPENDENT_AMBULATORY_CARE_PROVIDER_SITE_OTHER): Payer: Medicare HMO

## 2021-01-13 DIAGNOSIS — I6389 Other cerebral infarction: Secondary | ICD-10-CM

## 2021-01-21 DIAGNOSIS — E1165 Type 2 diabetes mellitus with hyperglycemia: Secondary | ICD-10-CM | POA: Diagnosis not present

## 2021-01-21 DIAGNOSIS — Z794 Long term (current) use of insulin: Secondary | ICD-10-CM | POA: Diagnosis not present

## 2021-01-21 DIAGNOSIS — E119 Type 2 diabetes mellitus without complications: Secondary | ICD-10-CM | POA: Diagnosis not present

## 2021-01-21 DIAGNOSIS — E1140 Type 2 diabetes mellitus with diabetic neuropathy, unspecified: Secondary | ICD-10-CM | POA: Diagnosis not present

## 2021-01-30 DIAGNOSIS — L293 Anogenital pruritus, unspecified: Secondary | ICD-10-CM | POA: Diagnosis not present

## 2021-01-30 DIAGNOSIS — N1832 Chronic kidney disease, stage 3b: Secondary | ICD-10-CM | POA: Diagnosis not present

## 2021-01-30 DIAGNOSIS — I129 Hypertensive chronic kidney disease with stage 1 through stage 4 chronic kidney disease, or unspecified chronic kidney disease: Secondary | ICD-10-CM | POA: Diagnosis not present

## 2021-01-30 NOTE — Progress Notes (Signed)
Carelink Summary Report / Loop Recorder 

## 2021-02-11 ENCOUNTER — Telehealth: Payer: Self-pay | Admitting: Internal Medicine

## 2021-02-11 DIAGNOSIS — I129 Hypertensive chronic kidney disease with stage 1 through stage 4 chronic kidney disease, or unspecified chronic kidney disease: Secondary | ICD-10-CM | POA: Diagnosis not present

## 2021-02-11 DIAGNOSIS — E039 Hypothyroidism, unspecified: Secondary | ICD-10-CM | POA: Diagnosis not present

## 2021-02-11 DIAGNOSIS — R000 Tachycardia, unspecified: Secondary | ICD-10-CM | POA: Diagnosis not present

## 2021-02-11 DIAGNOSIS — R21 Rash and other nonspecific skin eruption: Secondary | ICD-10-CM | POA: Diagnosis not present

## 2021-02-11 DIAGNOSIS — N1832 Chronic kidney disease, stage 3b: Secondary | ICD-10-CM | POA: Diagnosis not present

## 2021-02-11 NOTE — Telephone Encounter (Signed)
Patient came to Dr. Carlyle Lipa office this morning and had a period of rapid HR. EKG was done at the office. During the EKG 10-15 min after period of rapid HR the patients HR went back to the 70s.  Dr. Carlyle Lipa office can fax the EKG to our office if needed.   The patient was not in the office at the time of the call     STAT if HR is under 50 or over 120 (normal HR is 60-100 beats per minute)  What is your heart rate? 120-140 resting while at Dr. Carlyle Lipa office   Do you have a log of your heart rate readings (document readings)? no  Do you have any other symptoms? none    Dr. Carlyle Lipa office would like a report from the patient's Loop recorder starting at 9:40 am  GT put in the patient's loop recorder but the patient is not followed in our office

## 2021-02-11 NOTE — Telephone Encounter (Signed)
Patient has [id number], Most recent transmission from 02/11/21 @ 0246.  Show no significant arrhythmia.  The episode described here will not likely show up since the device is programmed to record episodes where HR >182 for more than 48 consecutive beats unless the HR was irregular.

## 2021-02-12 NOTE — Telephone Encounter (Signed)
Returned call to [facility] @ Dr. Carlyle Lipa office.  Left a detailed message for Arnette Norris what pt's most recent transmission showed at 2:46 02/11/21.  Asked them to call back if they needed anything else.

## 2021-02-13 ENCOUNTER — Ambulatory Visit (INDEPENDENT_AMBULATORY_CARE_PROVIDER_SITE_OTHER): Payer: Medicare HMO

## 2021-02-13 DIAGNOSIS — I6389 Other cerebral infarction: Secondary | ICD-10-CM

## 2021-02-14 LAB — CUP PACEART REMOTE DEVICE CHECK
Date Time Interrogation Session: 20220725230432
Implantable Pulse Generator Implant Date: 20210203

## 2021-03-03 DIAGNOSIS — L011 Impetiginization of other dermatoses: Secondary | ICD-10-CM | POA: Diagnosis not present

## 2021-03-03 DIAGNOSIS — L2089 Other atopic dermatitis: Secondary | ICD-10-CM | POA: Diagnosis not present

## 2021-03-03 DIAGNOSIS — L0889 Other specified local infections of the skin and subcutaneous tissue: Secondary | ICD-10-CM | POA: Diagnosis not present

## 2021-03-10 ENCOUNTER — Telehealth: Payer: Self-pay | Admitting: *Deleted

## 2021-03-10 NOTE — Telephone Encounter (Signed)
Patient called requesting refill on Terazol vaginal cream due to vaginal itching. Patient reports she was referred to a dermatologist from [place] and the dermatologist prescribed a antibiotic and told her she should take the Terazol cream along with antibiotic ( patient is not aware of the name of the antibiotic) patient is aware she needs to schedule annual exam as well. Please advise

## 2021-03-12 NOTE — Progress Notes (Signed)
Carelink Summary Report / Loop Recorder 

## 2021-03-14 MED ORDER — TERCONAZOLE 0.8 % VA CREA: 1.0000 | TOPICAL_CREAM | Freq: Every day | VAGINAL | 0 refills | Status: DC

## 2021-03-14 NOTE — Telephone Encounter (Signed)
Agree with Terazol.

## 2021-03-14 NOTE — Telephone Encounter (Signed)
Patient informed, Rx sent.  

## 2021-03-17 DIAGNOSIS — E782 Mixed hyperlipidemia: Secondary | ICD-10-CM | POA: Diagnosis not present

## 2021-03-17 DIAGNOSIS — E1121 Type 2 diabetes mellitus with diabetic nephropathy: Secondary | ICD-10-CM | POA: Diagnosis not present

## 2021-03-17 DIAGNOSIS — N1832 Chronic kidney disease, stage 3b: Secondary | ICD-10-CM | POA: Diagnosis not present

## 2021-03-17 DIAGNOSIS — E1142 Type 2 diabetes mellitus with diabetic polyneuropathy: Secondary | ICD-10-CM | POA: Diagnosis not present

## 2021-03-17 DIAGNOSIS — E1151 Type 2 diabetes mellitus with diabetic peripheral angiopathy without gangrene: Secondary | ICD-10-CM | POA: Diagnosis not present

## 2021-03-17 DIAGNOSIS — I129 Hypertensive chronic kidney disease with stage 1 through stage 4 chronic kidney disease, or unspecified chronic kidney disease: Secondary | ICD-10-CM | POA: Diagnosis not present

## 2021-03-17 DIAGNOSIS — E039 Hypothyroidism, unspecified: Secondary | ICD-10-CM | POA: Diagnosis not present

## 2021-03-17 DIAGNOSIS — M810 Age-related osteoporosis without current pathological fracture: Secondary | ICD-10-CM | POA: Diagnosis not present

## 2021-03-18 ENCOUNTER — Ambulatory Visit (INDEPENDENT_AMBULATORY_CARE_PROVIDER_SITE_OTHER): Payer: Medicare HMO

## 2021-03-18 DIAGNOSIS — I6389 Other cerebral infarction: Secondary | ICD-10-CM | POA: Diagnosis not present

## 2021-03-18 LAB — CUP PACEART REMOTE DEVICE CHECK
Date Time Interrogation Session: 20220827230440
Implantable Pulse Generator Implant Date: 20210203

## 2021-03-20 DIAGNOSIS — M21611 Bunion of right foot: Secondary | ICD-10-CM | POA: Diagnosis not present

## 2021-03-20 DIAGNOSIS — M65872 Other synovitis and tenosynovitis, left ankle and foot: Secondary | ICD-10-CM | POA: Diagnosis not present

## 2021-03-20 DIAGNOSIS — M25572 Pain in left ankle and joints of left foot: Secondary | ICD-10-CM | POA: Diagnosis not present

## 2021-03-20 DIAGNOSIS — M12272 Villonodular synovitis (pigmented), left ankle and foot: Secondary | ICD-10-CM | POA: Diagnosis not present

## 2021-03-31 NOTE — Progress Notes (Signed)
Carelink Summary Report / Loop Recorder 

## 2021-04-01 DIAGNOSIS — E1142 Type 2 diabetes mellitus with diabetic polyneuropathy: Secondary | ICD-10-CM | POA: Diagnosis not present

## 2021-04-01 DIAGNOSIS — E1165 Type 2 diabetes mellitus with hyperglycemia: Secondary | ICD-10-CM | POA: Diagnosis not present

## 2021-04-01 DIAGNOSIS — E039 Hypothyroidism, unspecified: Secondary | ICD-10-CM | POA: Diagnosis not present

## 2021-04-01 DIAGNOSIS — Z794 Long term (current) use of insulin: Secondary | ICD-10-CM | POA: Diagnosis not present

## 2021-04-03 DIAGNOSIS — M25571 Pain in right ankle and joints of right foot: Secondary | ICD-10-CM | POA: Diagnosis not present

## 2021-04-03 DIAGNOSIS — L309 Dermatitis, unspecified: Secondary | ICD-10-CM | POA: Diagnosis not present

## 2021-04-03 DIAGNOSIS — M65871 Other synovitis and tenosynovitis, right ankle and foot: Secondary | ICD-10-CM | POA: Diagnosis not present

## 2021-04-08 DIAGNOSIS — N1832 Chronic kidney disease, stage 3b: Secondary | ICD-10-CM | POA: Diagnosis not present

## 2021-04-08 DIAGNOSIS — I129 Hypertensive chronic kidney disease with stage 1 through stage 4 chronic kidney disease, or unspecified chronic kidney disease: Secondary | ICD-10-CM | POA: Diagnosis not present

## 2021-04-08 DIAGNOSIS — E222 Syndrome of inappropriate secretion of antidiuretic hormone: Secondary | ICD-10-CM | POA: Diagnosis not present

## 2021-04-08 DIAGNOSIS — R600 Localized edema: Secondary | ICD-10-CM | POA: Diagnosis not present

## 2021-04-08 DIAGNOSIS — Z23 Encounter for immunization: Secondary | ICD-10-CM | POA: Diagnosis not present

## 2021-04-08 DIAGNOSIS — E1151 Type 2 diabetes mellitus with diabetic peripheral angiopathy without gangrene: Secondary | ICD-10-CM | POA: Diagnosis not present

## 2021-04-08 DIAGNOSIS — R269 Unspecified abnormalities of gait and mobility: Secondary | ICD-10-CM | POA: Diagnosis not present

## 2021-04-08 DIAGNOSIS — E1121 Type 2 diabetes mellitus with diabetic nephropathy: Secondary | ICD-10-CM | POA: Diagnosis not present

## 2021-04-20 DIAGNOSIS — M810 Age-related osteoporosis without current pathological fracture: Secondary | ICD-10-CM | POA: Diagnosis not present

## 2021-04-20 DIAGNOSIS — Z8673 Personal history of transient ischemic attack (TIA), and cerebral infarction without residual deficits: Secondary | ICD-10-CM | POA: Diagnosis not present

## 2021-04-20 DIAGNOSIS — G40909 Epilepsy, unspecified, not intractable, without status epilepticus: Secondary | ICD-10-CM | POA: Diagnosis not present

## 2021-04-20 DIAGNOSIS — N1832 Chronic kidney disease, stage 3b: Secondary | ICD-10-CM | POA: Diagnosis not present

## 2021-04-20 DIAGNOSIS — E1122 Type 2 diabetes mellitus with diabetic chronic kidney disease: Secondary | ICD-10-CM | POA: Diagnosis not present

## 2021-04-20 DIAGNOSIS — M4722 Other spondylosis with radiculopathy, cervical region: Secondary | ICD-10-CM | POA: Diagnosis not present

## 2021-04-20 DIAGNOSIS — I1310 Hypertensive heart and chronic kidney disease without heart failure, with stage 1 through stage 4 chronic kidney disease, or unspecified chronic kidney disease: Secondary | ICD-10-CM | POA: Diagnosis not present

## 2021-04-20 DIAGNOSIS — E1151 Type 2 diabetes mellitus with diabetic peripheral angiopathy without gangrene: Secondary | ICD-10-CM | POA: Diagnosis not present

## 2021-04-20 DIAGNOSIS — M4802 Spinal stenosis, cervical region: Secondary | ICD-10-CM | POA: Diagnosis not present

## 2021-04-20 DIAGNOSIS — Z9181 History of falling: Secondary | ICD-10-CM | POA: Diagnosis not present

## 2021-04-20 DIAGNOSIS — E222 Syndrome of inappropriate secretion of antidiuretic hormone: Secondary | ICD-10-CM | POA: Diagnosis not present

## 2021-04-20 DIAGNOSIS — Z7982 Long term (current) use of aspirin: Secondary | ICD-10-CM | POA: Diagnosis not present

## 2021-04-20 DIAGNOSIS — M170 Bilateral primary osteoarthritis of knee: Secondary | ICD-10-CM | POA: Diagnosis not present

## 2021-04-20 DIAGNOSIS — I700 Atherosclerosis of aorta: Secondary | ICD-10-CM | POA: Diagnosis not present

## 2021-04-20 DIAGNOSIS — K219 Gastro-esophageal reflux disease without esophagitis: Secondary | ICD-10-CM | POA: Diagnosis not present

## 2021-04-20 DIAGNOSIS — M5010 Cervical disc disorder with radiculopathy, unspecified cervical region: Secondary | ICD-10-CM | POA: Diagnosis not present

## 2021-04-20 DIAGNOSIS — E782 Mixed hyperlipidemia: Secondary | ICD-10-CM | POA: Diagnosis not present

## 2021-04-20 DIAGNOSIS — Z79899 Other long term (current) drug therapy: Secondary | ICD-10-CM | POA: Diagnosis not present

## 2021-04-20 DIAGNOSIS — Z794 Long term (current) use of insulin: Secondary | ICD-10-CM | POA: Diagnosis not present

## 2021-04-20 DIAGNOSIS — D631 Anemia in chronic kidney disease: Secondary | ICD-10-CM | POA: Diagnosis not present

## 2021-04-20 DIAGNOSIS — M25471 Effusion, right ankle: Secondary | ICD-10-CM | POA: Diagnosis not present

## 2021-04-20 DIAGNOSIS — E1142 Type 2 diabetes mellitus with diabetic polyneuropathy: Secondary | ICD-10-CM | POA: Diagnosis not present

## 2021-04-20 DIAGNOSIS — K635 Polyp of colon: Secondary | ICD-10-CM | POA: Diagnosis not present

## 2021-04-20 DIAGNOSIS — R21 Rash and other nonspecific skin eruption: Secondary | ICD-10-CM | POA: Diagnosis not present

## 2021-04-20 DIAGNOSIS — E039 Hypothyroidism, unspecified: Secondary | ICD-10-CM | POA: Diagnosis not present

## 2021-04-21 ENCOUNTER — Ambulatory Visit (INDEPENDENT_AMBULATORY_CARE_PROVIDER_SITE_OTHER): Payer: Medicare HMO

## 2021-04-21 DIAGNOSIS — I6389 Other cerebral infarction: Secondary | ICD-10-CM

## 2021-04-22 DIAGNOSIS — E1142 Type 2 diabetes mellitus with diabetic polyneuropathy: Secondary | ICD-10-CM | POA: Diagnosis not present

## 2021-04-22 DIAGNOSIS — E039 Hypothyroidism, unspecified: Secondary | ICD-10-CM | POA: Diagnosis not present

## 2021-04-23 DIAGNOSIS — M25471 Effusion, right ankle: Secondary | ICD-10-CM | POA: Diagnosis not present

## 2021-04-23 DIAGNOSIS — Z79899 Other long term (current) drug therapy: Secondary | ICD-10-CM | POA: Diagnosis not present

## 2021-04-23 DIAGNOSIS — E1122 Type 2 diabetes mellitus with diabetic chronic kidney disease: Secondary | ICD-10-CM | POA: Diagnosis not present

## 2021-04-23 DIAGNOSIS — M810 Age-related osteoporosis without current pathological fracture: Secondary | ICD-10-CM | POA: Diagnosis not present

## 2021-04-23 DIAGNOSIS — E1165 Type 2 diabetes mellitus with hyperglycemia: Secondary | ICD-10-CM | POA: Diagnosis not present

## 2021-04-23 DIAGNOSIS — M4802 Spinal stenosis, cervical region: Secondary | ICD-10-CM | POA: Diagnosis not present

## 2021-04-23 DIAGNOSIS — E1142 Type 2 diabetes mellitus with diabetic polyneuropathy: Secondary | ICD-10-CM | POA: Diagnosis not present

## 2021-04-23 DIAGNOSIS — I1310 Hypertensive heart and chronic kidney disease without heart failure, with stage 1 through stage 4 chronic kidney disease, or unspecified chronic kidney disease: Secondary | ICD-10-CM | POA: Diagnosis not present

## 2021-04-23 DIAGNOSIS — E1140 Type 2 diabetes mellitus with diabetic neuropathy, unspecified: Secondary | ICD-10-CM | POA: Diagnosis not present

## 2021-04-23 DIAGNOSIS — Z7982 Long term (current) use of aspirin: Secondary | ICD-10-CM | POA: Diagnosis not present

## 2021-04-23 DIAGNOSIS — M170 Bilateral primary osteoarthritis of knee: Secondary | ICD-10-CM | POA: Diagnosis not present

## 2021-04-23 DIAGNOSIS — Z8673 Personal history of transient ischemic attack (TIA), and cerebral infarction without residual deficits: Secondary | ICD-10-CM | POA: Diagnosis not present

## 2021-04-23 DIAGNOSIS — R21 Rash and other nonspecific skin eruption: Secondary | ICD-10-CM | POA: Diagnosis not present

## 2021-04-23 DIAGNOSIS — K635 Polyp of colon: Secondary | ICD-10-CM | POA: Diagnosis not present

## 2021-04-23 DIAGNOSIS — E039 Hypothyroidism, unspecified: Secondary | ICD-10-CM | POA: Diagnosis not present

## 2021-04-23 DIAGNOSIS — I700 Atherosclerosis of aorta: Secondary | ICD-10-CM | POA: Diagnosis not present

## 2021-04-23 DIAGNOSIS — N1832 Chronic kidney disease, stage 3b: Secondary | ICD-10-CM | POA: Diagnosis not present

## 2021-04-23 DIAGNOSIS — E1151 Type 2 diabetes mellitus with diabetic peripheral angiopathy without gangrene: Secondary | ICD-10-CM | POA: Diagnosis not present

## 2021-04-23 DIAGNOSIS — E119 Type 2 diabetes mellitus without complications: Secondary | ICD-10-CM | POA: Diagnosis not present

## 2021-04-23 DIAGNOSIS — K219 Gastro-esophageal reflux disease without esophagitis: Secondary | ICD-10-CM | POA: Diagnosis not present

## 2021-04-23 DIAGNOSIS — Z9181 History of falling: Secondary | ICD-10-CM | POA: Diagnosis not present

## 2021-04-23 DIAGNOSIS — D631 Anemia in chronic kidney disease: Secondary | ICD-10-CM | POA: Diagnosis not present

## 2021-04-23 DIAGNOSIS — E222 Syndrome of inappropriate secretion of antidiuretic hormone: Secondary | ICD-10-CM | POA: Diagnosis not present

## 2021-04-23 DIAGNOSIS — M4722 Other spondylosis with radiculopathy, cervical region: Secondary | ICD-10-CM | POA: Diagnosis not present

## 2021-04-23 DIAGNOSIS — Z794 Long term (current) use of insulin: Secondary | ICD-10-CM | POA: Diagnosis not present

## 2021-04-23 DIAGNOSIS — M5010 Cervical disc disorder with radiculopathy, unspecified cervical region: Secondary | ICD-10-CM | POA: Diagnosis not present

## 2021-04-23 DIAGNOSIS — G40909 Epilepsy, unspecified, not intractable, without status epilepticus: Secondary | ICD-10-CM | POA: Diagnosis not present

## 2021-04-23 DIAGNOSIS — E782 Mixed hyperlipidemia: Secondary | ICD-10-CM | POA: Diagnosis not present

## 2021-04-23 LAB — CUP PACEART REMOTE DEVICE CHECK
Date Time Interrogation Session: 20220929230748
Implantable Pulse Generator Implant Date: 20210203

## 2021-04-25 DIAGNOSIS — N1832 Chronic kidney disease, stage 3b: Secondary | ICD-10-CM | POA: Diagnosis not present

## 2021-04-25 DIAGNOSIS — E1151 Type 2 diabetes mellitus with diabetic peripheral angiopathy without gangrene: Secondary | ICD-10-CM | POA: Diagnosis not present

## 2021-04-25 DIAGNOSIS — E1122 Type 2 diabetes mellitus with diabetic chronic kidney disease: Secondary | ICD-10-CM | POA: Diagnosis not present

## 2021-04-25 DIAGNOSIS — Z9181 History of falling: Secondary | ICD-10-CM | POA: Diagnosis not present

## 2021-04-25 DIAGNOSIS — E782 Mixed hyperlipidemia: Secondary | ICD-10-CM | POA: Diagnosis not present

## 2021-04-25 DIAGNOSIS — E222 Syndrome of inappropriate secretion of antidiuretic hormone: Secondary | ICD-10-CM | POA: Diagnosis not present

## 2021-04-25 DIAGNOSIS — E1142 Type 2 diabetes mellitus with diabetic polyneuropathy: Secondary | ICD-10-CM | POA: Diagnosis not present

## 2021-04-25 DIAGNOSIS — M4722 Other spondylosis with radiculopathy, cervical region: Secondary | ICD-10-CM | POA: Diagnosis not present

## 2021-04-25 DIAGNOSIS — Z8673 Personal history of transient ischemic attack (TIA), and cerebral infarction without residual deficits: Secondary | ICD-10-CM | POA: Diagnosis not present

## 2021-04-25 DIAGNOSIS — M5010 Cervical disc disorder with radiculopathy, unspecified cervical region: Secondary | ICD-10-CM | POA: Diagnosis not present

## 2021-04-25 DIAGNOSIS — M170 Bilateral primary osteoarthritis of knee: Secondary | ICD-10-CM | POA: Diagnosis not present

## 2021-04-25 DIAGNOSIS — Z7982 Long term (current) use of aspirin: Secondary | ICD-10-CM | POA: Diagnosis not present

## 2021-04-25 DIAGNOSIS — Z794 Long term (current) use of insulin: Secondary | ICD-10-CM | POA: Diagnosis not present

## 2021-04-25 DIAGNOSIS — M810 Age-related osteoporosis without current pathological fracture: Secondary | ICD-10-CM | POA: Diagnosis not present

## 2021-04-25 DIAGNOSIS — Z79899 Other long term (current) drug therapy: Secondary | ICD-10-CM | POA: Diagnosis not present

## 2021-04-25 DIAGNOSIS — R21 Rash and other nonspecific skin eruption: Secondary | ICD-10-CM | POA: Diagnosis not present

## 2021-04-25 DIAGNOSIS — M4802 Spinal stenosis, cervical region: Secondary | ICD-10-CM | POA: Diagnosis not present

## 2021-04-25 DIAGNOSIS — K635 Polyp of colon: Secondary | ICD-10-CM | POA: Diagnosis not present

## 2021-04-25 DIAGNOSIS — E039 Hypothyroidism, unspecified: Secondary | ICD-10-CM | POA: Diagnosis not present

## 2021-04-25 DIAGNOSIS — I700 Atherosclerosis of aorta: Secondary | ICD-10-CM | POA: Diagnosis not present

## 2021-04-25 DIAGNOSIS — K219 Gastro-esophageal reflux disease without esophagitis: Secondary | ICD-10-CM | POA: Diagnosis not present

## 2021-04-25 DIAGNOSIS — I1310 Hypertensive heart and chronic kidney disease without heart failure, with stage 1 through stage 4 chronic kidney disease, or unspecified chronic kidney disease: Secondary | ICD-10-CM | POA: Diagnosis not present

## 2021-04-25 DIAGNOSIS — D631 Anemia in chronic kidney disease: Secondary | ICD-10-CM | POA: Diagnosis not present

## 2021-04-25 DIAGNOSIS — G40909 Epilepsy, unspecified, not intractable, without status epilepticus: Secondary | ICD-10-CM | POA: Diagnosis not present

## 2021-04-25 DIAGNOSIS — M25471 Effusion, right ankle: Secondary | ICD-10-CM | POA: Diagnosis not present

## 2021-04-28 DIAGNOSIS — E222 Syndrome of inappropriate secretion of antidiuretic hormone: Secondary | ICD-10-CM | POA: Diagnosis not present

## 2021-04-28 DIAGNOSIS — N1832 Chronic kidney disease, stage 3b: Secondary | ICD-10-CM | POA: Diagnosis not present

## 2021-04-28 DIAGNOSIS — M4722 Other spondylosis with radiculopathy, cervical region: Secondary | ICD-10-CM | POA: Diagnosis not present

## 2021-04-28 DIAGNOSIS — Z794 Long term (current) use of insulin: Secondary | ICD-10-CM | POA: Diagnosis not present

## 2021-04-28 DIAGNOSIS — E1142 Type 2 diabetes mellitus with diabetic polyneuropathy: Secondary | ICD-10-CM | POA: Diagnosis not present

## 2021-04-28 DIAGNOSIS — I700 Atherosclerosis of aorta: Secondary | ICD-10-CM | POA: Diagnosis not present

## 2021-04-28 DIAGNOSIS — E039 Hypothyroidism, unspecified: Secondary | ICD-10-CM | POA: Diagnosis not present

## 2021-04-28 DIAGNOSIS — Z8673 Personal history of transient ischemic attack (TIA), and cerebral infarction without residual deficits: Secondary | ICD-10-CM | POA: Diagnosis not present

## 2021-04-28 DIAGNOSIS — G40909 Epilepsy, unspecified, not intractable, without status epilepticus: Secondary | ICD-10-CM | POA: Diagnosis not present

## 2021-04-28 DIAGNOSIS — M5010 Cervical disc disorder with radiculopathy, unspecified cervical region: Secondary | ICD-10-CM | POA: Diagnosis not present

## 2021-04-28 DIAGNOSIS — M4802 Spinal stenosis, cervical region: Secondary | ICD-10-CM | POA: Diagnosis not present

## 2021-04-28 DIAGNOSIS — I1310 Hypertensive heart and chronic kidney disease without heart failure, with stage 1 through stage 4 chronic kidney disease, or unspecified chronic kidney disease: Secondary | ICD-10-CM | POA: Diagnosis not present

## 2021-04-28 DIAGNOSIS — Z7982 Long term (current) use of aspirin: Secondary | ICD-10-CM | POA: Diagnosis not present

## 2021-04-28 DIAGNOSIS — M170 Bilateral primary osteoarthritis of knee: Secondary | ICD-10-CM | POA: Diagnosis not present

## 2021-04-28 DIAGNOSIS — Z79899 Other long term (current) drug therapy: Secondary | ICD-10-CM | POA: Diagnosis not present

## 2021-04-28 DIAGNOSIS — E1151 Type 2 diabetes mellitus with diabetic peripheral angiopathy without gangrene: Secondary | ICD-10-CM | POA: Diagnosis not present

## 2021-04-28 DIAGNOSIS — D631 Anemia in chronic kidney disease: Secondary | ICD-10-CM | POA: Diagnosis not present

## 2021-04-28 DIAGNOSIS — Z9181 History of falling: Secondary | ICD-10-CM | POA: Diagnosis not present

## 2021-04-28 DIAGNOSIS — E1122 Type 2 diabetes mellitus with diabetic chronic kidney disease: Secondary | ICD-10-CM | POA: Diagnosis not present

## 2021-04-28 DIAGNOSIS — E782 Mixed hyperlipidemia: Secondary | ICD-10-CM | POA: Diagnosis not present

## 2021-04-28 DIAGNOSIS — R21 Rash and other nonspecific skin eruption: Secondary | ICD-10-CM | POA: Diagnosis not present

## 2021-04-28 DIAGNOSIS — K635 Polyp of colon: Secondary | ICD-10-CM | POA: Diagnosis not present

## 2021-04-28 DIAGNOSIS — K219 Gastro-esophageal reflux disease without esophagitis: Secondary | ICD-10-CM | POA: Diagnosis not present

## 2021-04-28 DIAGNOSIS — M810 Age-related osteoporosis without current pathological fracture: Secondary | ICD-10-CM | POA: Diagnosis not present

## 2021-04-28 DIAGNOSIS — M25471 Effusion, right ankle: Secondary | ICD-10-CM | POA: Diagnosis not present

## 2021-04-29 DIAGNOSIS — Z794 Long term (current) use of insulin: Secondary | ICD-10-CM | POA: Diagnosis not present

## 2021-04-29 DIAGNOSIS — Z8673 Personal history of transient ischemic attack (TIA), and cerebral infarction without residual deficits: Secondary | ICD-10-CM | POA: Diagnosis not present

## 2021-04-29 DIAGNOSIS — R21 Rash and other nonspecific skin eruption: Secondary | ICD-10-CM | POA: Diagnosis not present

## 2021-04-29 DIAGNOSIS — I129 Hypertensive chronic kidney disease with stage 1 through stage 4 chronic kidney disease, or unspecified chronic kidney disease: Secondary | ICD-10-CM | POA: Diagnosis not present

## 2021-04-29 DIAGNOSIS — K635 Polyp of colon: Secondary | ICD-10-CM | POA: Diagnosis not present

## 2021-04-29 DIAGNOSIS — E1142 Type 2 diabetes mellitus with diabetic polyneuropathy: Secondary | ICD-10-CM | POA: Diagnosis not present

## 2021-04-29 DIAGNOSIS — I1310 Hypertensive heart and chronic kidney disease without heart failure, with stage 1 through stage 4 chronic kidney disease, or unspecified chronic kidney disease: Secondary | ICD-10-CM | POA: Diagnosis not present

## 2021-04-29 DIAGNOSIS — I700 Atherosclerosis of aorta: Secondary | ICD-10-CM | POA: Diagnosis not present

## 2021-04-29 DIAGNOSIS — M4802 Spinal stenosis, cervical region: Secondary | ICD-10-CM | POA: Diagnosis not present

## 2021-04-29 DIAGNOSIS — R1013 Epigastric pain: Secondary | ICD-10-CM | POA: Diagnosis not present

## 2021-04-29 DIAGNOSIS — M5010 Cervical disc disorder with radiculopathy, unspecified cervical region: Secondary | ICD-10-CM | POA: Diagnosis not present

## 2021-04-29 DIAGNOSIS — N1832 Chronic kidney disease, stage 3b: Secondary | ICD-10-CM | POA: Diagnosis not present

## 2021-04-29 DIAGNOSIS — D649 Anemia, unspecified: Secondary | ICD-10-CM | POA: Diagnosis not present

## 2021-04-29 DIAGNOSIS — Z9181 History of falling: Secondary | ICD-10-CM | POA: Diagnosis not present

## 2021-04-29 DIAGNOSIS — E1151 Type 2 diabetes mellitus with diabetic peripheral angiopathy without gangrene: Secondary | ICD-10-CM | POA: Diagnosis not present

## 2021-04-29 DIAGNOSIS — M170 Bilateral primary osteoarthritis of knee: Secondary | ICD-10-CM | POA: Diagnosis not present

## 2021-04-29 DIAGNOSIS — M25471 Effusion, right ankle: Secondary | ICD-10-CM | POA: Diagnosis not present

## 2021-04-29 DIAGNOSIS — G40909 Epilepsy, unspecified, not intractable, without status epilepticus: Secondary | ICD-10-CM | POA: Diagnosis not present

## 2021-04-29 DIAGNOSIS — E1122 Type 2 diabetes mellitus with diabetic chronic kidney disease: Secondary | ICD-10-CM | POA: Diagnosis not present

## 2021-04-29 DIAGNOSIS — M810 Age-related osteoporosis without current pathological fracture: Secondary | ICD-10-CM | POA: Diagnosis not present

## 2021-04-29 DIAGNOSIS — K219 Gastro-esophageal reflux disease without esophagitis: Secondary | ICD-10-CM | POA: Diagnosis not present

## 2021-04-29 DIAGNOSIS — E222 Syndrome of inappropriate secretion of antidiuretic hormone: Secondary | ICD-10-CM | POA: Diagnosis not present

## 2021-04-29 DIAGNOSIS — Z7982 Long term (current) use of aspirin: Secondary | ICD-10-CM | POA: Diagnosis not present

## 2021-04-29 DIAGNOSIS — E782 Mixed hyperlipidemia: Secondary | ICD-10-CM | POA: Diagnosis not present

## 2021-04-29 DIAGNOSIS — M4722 Other spondylosis with radiculopathy, cervical region: Secondary | ICD-10-CM | POA: Diagnosis not present

## 2021-04-29 DIAGNOSIS — Z79899 Other long term (current) drug therapy: Secondary | ICD-10-CM | POA: Diagnosis not present

## 2021-04-29 DIAGNOSIS — E039 Hypothyroidism, unspecified: Secondary | ICD-10-CM | POA: Diagnosis not present

## 2021-04-29 DIAGNOSIS — D631 Anemia in chronic kidney disease: Secondary | ICD-10-CM | POA: Diagnosis not present

## 2021-04-29 NOTE — Progress Notes (Signed)
Carelink Summary Report / Loop Recorder 

## 2021-05-01 DIAGNOSIS — Z9181 History of falling: Secondary | ICD-10-CM | POA: Diagnosis not present

## 2021-05-01 DIAGNOSIS — Z7982 Long term (current) use of aspirin: Secondary | ICD-10-CM | POA: Diagnosis not present

## 2021-05-01 DIAGNOSIS — E782 Mixed hyperlipidemia: Secondary | ICD-10-CM | POA: Diagnosis not present

## 2021-05-01 DIAGNOSIS — E1122 Type 2 diabetes mellitus with diabetic chronic kidney disease: Secondary | ICD-10-CM | POA: Diagnosis not present

## 2021-05-01 DIAGNOSIS — Z794 Long term (current) use of insulin: Secondary | ICD-10-CM | POA: Diagnosis not present

## 2021-05-01 DIAGNOSIS — E1151 Type 2 diabetes mellitus with diabetic peripheral angiopathy without gangrene: Secondary | ICD-10-CM | POA: Diagnosis not present

## 2021-05-01 DIAGNOSIS — R21 Rash and other nonspecific skin eruption: Secondary | ICD-10-CM | POA: Diagnosis not present

## 2021-05-01 DIAGNOSIS — N1832 Chronic kidney disease, stage 3b: Secondary | ICD-10-CM | POA: Diagnosis not present

## 2021-05-01 DIAGNOSIS — M25471 Effusion, right ankle: Secondary | ICD-10-CM | POA: Diagnosis not present

## 2021-05-01 DIAGNOSIS — E1142 Type 2 diabetes mellitus with diabetic polyneuropathy: Secondary | ICD-10-CM | POA: Diagnosis not present

## 2021-05-01 DIAGNOSIS — G40909 Epilepsy, unspecified, not intractable, without status epilepticus: Secondary | ICD-10-CM | POA: Diagnosis not present

## 2021-05-01 DIAGNOSIS — M5010 Cervical disc disorder with radiculopathy, unspecified cervical region: Secondary | ICD-10-CM | POA: Diagnosis not present

## 2021-05-01 DIAGNOSIS — K635 Polyp of colon: Secondary | ICD-10-CM | POA: Diagnosis not present

## 2021-05-01 DIAGNOSIS — M170 Bilateral primary osteoarthritis of knee: Secondary | ICD-10-CM | POA: Diagnosis not present

## 2021-05-01 DIAGNOSIS — M810 Age-related osteoporosis without current pathological fracture: Secondary | ICD-10-CM | POA: Diagnosis not present

## 2021-05-01 DIAGNOSIS — E039 Hypothyroidism, unspecified: Secondary | ICD-10-CM | POA: Diagnosis not present

## 2021-05-01 DIAGNOSIS — I700 Atherosclerosis of aorta: Secondary | ICD-10-CM | POA: Diagnosis not present

## 2021-05-01 DIAGNOSIS — I1310 Hypertensive heart and chronic kidney disease without heart failure, with stage 1 through stage 4 chronic kidney disease, or unspecified chronic kidney disease: Secondary | ICD-10-CM | POA: Diagnosis not present

## 2021-05-01 DIAGNOSIS — M4802 Spinal stenosis, cervical region: Secondary | ICD-10-CM | POA: Diagnosis not present

## 2021-05-01 DIAGNOSIS — M4722 Other spondylosis with radiculopathy, cervical region: Secondary | ICD-10-CM | POA: Diagnosis not present

## 2021-05-01 DIAGNOSIS — K219 Gastro-esophageal reflux disease without esophagitis: Secondary | ICD-10-CM | POA: Diagnosis not present

## 2021-05-01 DIAGNOSIS — Z79899 Other long term (current) drug therapy: Secondary | ICD-10-CM | POA: Diagnosis not present

## 2021-05-01 DIAGNOSIS — E222 Syndrome of inappropriate secretion of antidiuretic hormone: Secondary | ICD-10-CM | POA: Diagnosis not present

## 2021-05-01 DIAGNOSIS — D631 Anemia in chronic kidney disease: Secondary | ICD-10-CM | POA: Diagnosis not present

## 2021-05-01 DIAGNOSIS — Z8673 Personal history of transient ischemic attack (TIA), and cerebral infarction without residual deficits: Secondary | ICD-10-CM | POA: Diagnosis not present

## 2021-05-02 DIAGNOSIS — E1122 Type 2 diabetes mellitus with diabetic chronic kidney disease: Secondary | ICD-10-CM | POA: Diagnosis not present

## 2021-05-02 DIAGNOSIS — M810 Age-related osteoporosis without current pathological fracture: Secondary | ICD-10-CM | POA: Diagnosis not present

## 2021-05-02 DIAGNOSIS — D631 Anemia in chronic kidney disease: Secondary | ICD-10-CM | POA: Diagnosis not present

## 2021-05-02 DIAGNOSIS — Z9181 History of falling: Secondary | ICD-10-CM | POA: Diagnosis not present

## 2021-05-02 DIAGNOSIS — M4722 Other spondylosis with radiculopathy, cervical region: Secondary | ICD-10-CM | POA: Diagnosis not present

## 2021-05-02 DIAGNOSIS — K635 Polyp of colon: Secondary | ICD-10-CM | POA: Diagnosis not present

## 2021-05-02 DIAGNOSIS — E039 Hypothyroidism, unspecified: Secondary | ICD-10-CM | POA: Diagnosis not present

## 2021-05-02 DIAGNOSIS — N1832 Chronic kidney disease, stage 3b: Secondary | ICD-10-CM | POA: Diagnosis not present

## 2021-05-02 DIAGNOSIS — R21 Rash and other nonspecific skin eruption: Secondary | ICD-10-CM | POA: Diagnosis not present

## 2021-05-02 DIAGNOSIS — I700 Atherosclerosis of aorta: Secondary | ICD-10-CM | POA: Diagnosis not present

## 2021-05-02 DIAGNOSIS — M4802 Spinal stenosis, cervical region: Secondary | ICD-10-CM | POA: Diagnosis not present

## 2021-05-02 DIAGNOSIS — E1142 Type 2 diabetes mellitus with diabetic polyneuropathy: Secondary | ICD-10-CM | POA: Diagnosis not present

## 2021-05-02 DIAGNOSIS — Z8673 Personal history of transient ischemic attack (TIA), and cerebral infarction without residual deficits: Secondary | ICD-10-CM | POA: Diagnosis not present

## 2021-05-02 DIAGNOSIS — G40909 Epilepsy, unspecified, not intractable, without status epilepticus: Secondary | ICD-10-CM | POA: Diagnosis not present

## 2021-05-02 DIAGNOSIS — Z79899 Other long term (current) drug therapy: Secondary | ICD-10-CM | POA: Diagnosis not present

## 2021-05-02 DIAGNOSIS — E222 Syndrome of inappropriate secretion of antidiuretic hormone: Secondary | ICD-10-CM | POA: Diagnosis not present

## 2021-05-02 DIAGNOSIS — M25471 Effusion, right ankle: Secondary | ICD-10-CM | POA: Diagnosis not present

## 2021-05-02 DIAGNOSIS — M170 Bilateral primary osteoarthritis of knee: Secondary | ICD-10-CM | POA: Diagnosis not present

## 2021-05-02 DIAGNOSIS — E782 Mixed hyperlipidemia: Secondary | ICD-10-CM | POA: Diagnosis not present

## 2021-05-02 DIAGNOSIS — M5010 Cervical disc disorder with radiculopathy, unspecified cervical region: Secondary | ICD-10-CM | POA: Diagnosis not present

## 2021-05-02 DIAGNOSIS — E1151 Type 2 diabetes mellitus with diabetic peripheral angiopathy without gangrene: Secondary | ICD-10-CM | POA: Diagnosis not present

## 2021-05-02 DIAGNOSIS — Z7982 Long term (current) use of aspirin: Secondary | ICD-10-CM | POA: Diagnosis not present

## 2021-05-02 DIAGNOSIS — K219 Gastro-esophageal reflux disease without esophagitis: Secondary | ICD-10-CM | POA: Diagnosis not present

## 2021-05-02 DIAGNOSIS — I1310 Hypertensive heart and chronic kidney disease without heart failure, with stage 1 through stage 4 chronic kidney disease, or unspecified chronic kidney disease: Secondary | ICD-10-CM | POA: Diagnosis not present

## 2021-05-02 DIAGNOSIS — Z794 Long term (current) use of insulin: Secondary | ICD-10-CM | POA: Diagnosis not present

## 2021-05-05 DIAGNOSIS — M4802 Spinal stenosis, cervical region: Secondary | ICD-10-CM | POA: Diagnosis not present

## 2021-05-05 DIAGNOSIS — E1151 Type 2 diabetes mellitus with diabetic peripheral angiopathy without gangrene: Secondary | ICD-10-CM | POA: Diagnosis not present

## 2021-05-05 DIAGNOSIS — D631 Anemia in chronic kidney disease: Secondary | ICD-10-CM | POA: Diagnosis not present

## 2021-05-05 DIAGNOSIS — E782 Mixed hyperlipidemia: Secondary | ICD-10-CM | POA: Diagnosis not present

## 2021-05-05 DIAGNOSIS — E1122 Type 2 diabetes mellitus with diabetic chronic kidney disease: Secondary | ICD-10-CM | POA: Diagnosis not present

## 2021-05-05 DIAGNOSIS — N1832 Chronic kidney disease, stage 3b: Secondary | ICD-10-CM | POA: Diagnosis not present

## 2021-05-05 DIAGNOSIS — M810 Age-related osteoporosis without current pathological fracture: Secondary | ICD-10-CM | POA: Diagnosis not present

## 2021-05-05 DIAGNOSIS — M170 Bilateral primary osteoarthritis of knee: Secondary | ICD-10-CM | POA: Diagnosis not present

## 2021-05-05 DIAGNOSIS — M4722 Other spondylosis with radiculopathy, cervical region: Secondary | ICD-10-CM | POA: Diagnosis not present

## 2021-05-05 DIAGNOSIS — R21 Rash and other nonspecific skin eruption: Secondary | ICD-10-CM | POA: Diagnosis not present

## 2021-05-05 DIAGNOSIS — Z7982 Long term (current) use of aspirin: Secondary | ICD-10-CM | POA: Diagnosis not present

## 2021-05-05 DIAGNOSIS — I700 Atherosclerosis of aorta: Secondary | ICD-10-CM | POA: Diagnosis not present

## 2021-05-05 DIAGNOSIS — I1310 Hypertensive heart and chronic kidney disease without heart failure, with stage 1 through stage 4 chronic kidney disease, or unspecified chronic kidney disease: Secondary | ICD-10-CM | POA: Diagnosis not present

## 2021-05-05 DIAGNOSIS — Z794 Long term (current) use of insulin: Secondary | ICD-10-CM | POA: Diagnosis not present

## 2021-05-05 DIAGNOSIS — Z8673 Personal history of transient ischemic attack (TIA), and cerebral infarction without residual deficits: Secondary | ICD-10-CM | POA: Diagnosis not present

## 2021-05-05 DIAGNOSIS — E1142 Type 2 diabetes mellitus with diabetic polyneuropathy: Secondary | ICD-10-CM | POA: Diagnosis not present

## 2021-05-05 DIAGNOSIS — G40909 Epilepsy, unspecified, not intractable, without status epilepticus: Secondary | ICD-10-CM | POA: Diagnosis not present

## 2021-05-05 DIAGNOSIS — E039 Hypothyroidism, unspecified: Secondary | ICD-10-CM | POA: Diagnosis not present

## 2021-05-05 DIAGNOSIS — Z9181 History of falling: Secondary | ICD-10-CM | POA: Diagnosis not present

## 2021-05-05 DIAGNOSIS — K219 Gastro-esophageal reflux disease without esophagitis: Secondary | ICD-10-CM | POA: Diagnosis not present

## 2021-05-05 DIAGNOSIS — E222 Syndrome of inappropriate secretion of antidiuretic hormone: Secondary | ICD-10-CM | POA: Diagnosis not present

## 2021-05-05 DIAGNOSIS — M25471 Effusion, right ankle: Secondary | ICD-10-CM | POA: Diagnosis not present

## 2021-05-05 DIAGNOSIS — M5010 Cervical disc disorder with radiculopathy, unspecified cervical region: Secondary | ICD-10-CM | POA: Diagnosis not present

## 2021-05-05 DIAGNOSIS — K635 Polyp of colon: Secondary | ICD-10-CM | POA: Diagnosis not present

## 2021-05-05 DIAGNOSIS — Z79899 Other long term (current) drug therapy: Secondary | ICD-10-CM | POA: Diagnosis not present

## 2021-05-06 DIAGNOSIS — Z79899 Other long term (current) drug therapy: Secondary | ICD-10-CM | POA: Diagnosis not present

## 2021-05-06 DIAGNOSIS — K635 Polyp of colon: Secondary | ICD-10-CM | POA: Diagnosis not present

## 2021-05-06 DIAGNOSIS — D631 Anemia in chronic kidney disease: Secondary | ICD-10-CM | POA: Diagnosis not present

## 2021-05-06 DIAGNOSIS — G40909 Epilepsy, unspecified, not intractable, without status epilepticus: Secondary | ICD-10-CM | POA: Diagnosis not present

## 2021-05-06 DIAGNOSIS — E039 Hypothyroidism, unspecified: Secondary | ICD-10-CM | POA: Diagnosis not present

## 2021-05-06 DIAGNOSIS — N1832 Chronic kidney disease, stage 3b: Secondary | ICD-10-CM | POA: Diagnosis not present

## 2021-05-06 DIAGNOSIS — K219 Gastro-esophageal reflux disease without esophagitis: Secondary | ICD-10-CM | POA: Diagnosis not present

## 2021-05-06 DIAGNOSIS — E1142 Type 2 diabetes mellitus with diabetic polyneuropathy: Secondary | ICD-10-CM | POA: Diagnosis not present

## 2021-05-06 DIAGNOSIS — E222 Syndrome of inappropriate secretion of antidiuretic hormone: Secondary | ICD-10-CM | POA: Diagnosis not present

## 2021-05-06 DIAGNOSIS — M5010 Cervical disc disorder with radiculopathy, unspecified cervical region: Secondary | ICD-10-CM | POA: Diagnosis not present

## 2021-05-06 DIAGNOSIS — Z794 Long term (current) use of insulin: Secondary | ICD-10-CM | POA: Diagnosis not present

## 2021-05-06 DIAGNOSIS — M25471 Effusion, right ankle: Secondary | ICD-10-CM | POA: Diagnosis not present

## 2021-05-06 DIAGNOSIS — M810 Age-related osteoporosis without current pathological fracture: Secondary | ICD-10-CM | POA: Diagnosis not present

## 2021-05-06 DIAGNOSIS — Z9181 History of falling: Secondary | ICD-10-CM | POA: Diagnosis not present

## 2021-05-06 DIAGNOSIS — M4722 Other spondylosis with radiculopathy, cervical region: Secondary | ICD-10-CM | POA: Diagnosis not present

## 2021-05-06 DIAGNOSIS — M4802 Spinal stenosis, cervical region: Secondary | ICD-10-CM | POA: Diagnosis not present

## 2021-05-06 DIAGNOSIS — I1310 Hypertensive heart and chronic kidney disease without heart failure, with stage 1 through stage 4 chronic kidney disease, or unspecified chronic kidney disease: Secondary | ICD-10-CM | POA: Diagnosis not present

## 2021-05-06 DIAGNOSIS — I700 Atherosclerosis of aorta: Secondary | ICD-10-CM | POA: Diagnosis not present

## 2021-05-06 DIAGNOSIS — R21 Rash and other nonspecific skin eruption: Secondary | ICD-10-CM | POA: Diagnosis not present

## 2021-05-06 DIAGNOSIS — E1151 Type 2 diabetes mellitus with diabetic peripheral angiopathy without gangrene: Secondary | ICD-10-CM | POA: Diagnosis not present

## 2021-05-06 DIAGNOSIS — M170 Bilateral primary osteoarthritis of knee: Secondary | ICD-10-CM | POA: Diagnosis not present

## 2021-05-06 DIAGNOSIS — Z8673 Personal history of transient ischemic attack (TIA), and cerebral infarction without residual deficits: Secondary | ICD-10-CM | POA: Diagnosis not present

## 2021-05-06 DIAGNOSIS — E1122 Type 2 diabetes mellitus with diabetic chronic kidney disease: Secondary | ICD-10-CM | POA: Diagnosis not present

## 2021-05-06 DIAGNOSIS — E782 Mixed hyperlipidemia: Secondary | ICD-10-CM | POA: Diagnosis not present

## 2021-05-06 DIAGNOSIS — Z7982 Long term (current) use of aspirin: Secondary | ICD-10-CM | POA: Diagnosis not present

## 2021-05-08 DIAGNOSIS — M170 Bilateral primary osteoarthritis of knee: Secondary | ICD-10-CM | POA: Diagnosis not present

## 2021-05-08 DIAGNOSIS — M5010 Cervical disc disorder with radiculopathy, unspecified cervical region: Secondary | ICD-10-CM | POA: Diagnosis not present

## 2021-05-08 DIAGNOSIS — M4722 Other spondylosis with radiculopathy, cervical region: Secondary | ICD-10-CM | POA: Diagnosis not present

## 2021-05-08 DIAGNOSIS — M810 Age-related osteoporosis without current pathological fracture: Secondary | ICD-10-CM | POA: Diagnosis not present

## 2021-05-08 DIAGNOSIS — Z9181 History of falling: Secondary | ICD-10-CM | POA: Diagnosis not present

## 2021-05-08 DIAGNOSIS — Z8673 Personal history of transient ischemic attack (TIA), and cerebral infarction without residual deficits: Secondary | ICD-10-CM | POA: Diagnosis not present

## 2021-05-08 DIAGNOSIS — E222 Syndrome of inappropriate secretion of antidiuretic hormone: Secondary | ICD-10-CM | POA: Diagnosis not present

## 2021-05-08 DIAGNOSIS — M25471 Effusion, right ankle: Secondary | ICD-10-CM | POA: Diagnosis not present

## 2021-05-08 DIAGNOSIS — L308 Other specified dermatitis: Secondary | ICD-10-CM | POA: Diagnosis not present

## 2021-05-08 DIAGNOSIS — L089 Local infection of the skin and subcutaneous tissue, unspecified: Secondary | ICD-10-CM | POA: Diagnosis not present

## 2021-05-08 DIAGNOSIS — D631 Anemia in chronic kidney disease: Secondary | ICD-10-CM | POA: Diagnosis not present

## 2021-05-08 DIAGNOSIS — R21 Rash and other nonspecific skin eruption: Secondary | ICD-10-CM | POA: Diagnosis not present

## 2021-05-08 DIAGNOSIS — E1151 Type 2 diabetes mellitus with diabetic peripheral angiopathy without gangrene: Secondary | ICD-10-CM | POA: Diagnosis not present

## 2021-05-08 DIAGNOSIS — Z7982 Long term (current) use of aspirin: Secondary | ICD-10-CM | POA: Diagnosis not present

## 2021-05-08 DIAGNOSIS — E782 Mixed hyperlipidemia: Secondary | ICD-10-CM | POA: Diagnosis not present

## 2021-05-08 DIAGNOSIS — E039 Hypothyroidism, unspecified: Secondary | ICD-10-CM | POA: Diagnosis not present

## 2021-05-08 DIAGNOSIS — M4802 Spinal stenosis, cervical region: Secondary | ICD-10-CM | POA: Diagnosis not present

## 2021-05-08 DIAGNOSIS — Z79899 Other long term (current) drug therapy: Secondary | ICD-10-CM | POA: Diagnosis not present

## 2021-05-08 DIAGNOSIS — M722 Plantar fascial fibromatosis: Secondary | ICD-10-CM | POA: Diagnosis not present

## 2021-05-08 DIAGNOSIS — M65871 Other synovitis and tenosynovitis, right ankle and foot: Secondary | ICD-10-CM | POA: Diagnosis not present

## 2021-05-08 DIAGNOSIS — K219 Gastro-esophageal reflux disease without esophagitis: Secondary | ICD-10-CM | POA: Diagnosis not present

## 2021-05-08 DIAGNOSIS — K635 Polyp of colon: Secondary | ICD-10-CM | POA: Diagnosis not present

## 2021-05-08 DIAGNOSIS — G40909 Epilepsy, unspecified, not intractable, without status epilepticus: Secondary | ICD-10-CM | POA: Diagnosis not present

## 2021-05-08 DIAGNOSIS — E1142 Type 2 diabetes mellitus with diabetic polyneuropathy: Secondary | ICD-10-CM | POA: Diagnosis not present

## 2021-05-08 DIAGNOSIS — E1122 Type 2 diabetes mellitus with diabetic chronic kidney disease: Secondary | ICD-10-CM | POA: Diagnosis not present

## 2021-05-08 DIAGNOSIS — L309 Dermatitis, unspecified: Secondary | ICD-10-CM | POA: Diagnosis not present

## 2021-05-08 DIAGNOSIS — I700 Atherosclerosis of aorta: Secondary | ICD-10-CM | POA: Diagnosis not present

## 2021-05-08 DIAGNOSIS — D485 Neoplasm of uncertain behavior of skin: Secondary | ICD-10-CM | POA: Diagnosis not present

## 2021-05-08 DIAGNOSIS — I1310 Hypertensive heart and chronic kidney disease without heart failure, with stage 1 through stage 4 chronic kidney disease, or unspecified chronic kidney disease: Secondary | ICD-10-CM | POA: Diagnosis not present

## 2021-05-08 DIAGNOSIS — Z794 Long term (current) use of insulin: Secondary | ICD-10-CM | POA: Diagnosis not present

## 2021-05-08 DIAGNOSIS — N1832 Chronic kidney disease, stage 3b: Secondary | ICD-10-CM | POA: Diagnosis not present

## 2021-05-12 DIAGNOSIS — M170 Bilateral primary osteoarthritis of knee: Secondary | ICD-10-CM | POA: Diagnosis not present

## 2021-05-12 DIAGNOSIS — E1122 Type 2 diabetes mellitus with diabetic chronic kidney disease: Secondary | ICD-10-CM | POA: Diagnosis not present

## 2021-05-12 DIAGNOSIS — M4802 Spinal stenosis, cervical region: Secondary | ICD-10-CM | POA: Diagnosis not present

## 2021-05-12 DIAGNOSIS — E1151 Type 2 diabetes mellitus with diabetic peripheral angiopathy without gangrene: Secondary | ICD-10-CM | POA: Diagnosis not present

## 2021-05-12 DIAGNOSIS — Z79899 Other long term (current) drug therapy: Secondary | ICD-10-CM | POA: Diagnosis not present

## 2021-05-12 DIAGNOSIS — D631 Anemia in chronic kidney disease: Secondary | ICD-10-CM | POA: Diagnosis not present

## 2021-05-12 DIAGNOSIS — I1310 Hypertensive heart and chronic kidney disease without heart failure, with stage 1 through stage 4 chronic kidney disease, or unspecified chronic kidney disease: Secondary | ICD-10-CM | POA: Diagnosis not present

## 2021-05-12 DIAGNOSIS — N1832 Chronic kidney disease, stage 3b: Secondary | ICD-10-CM | POA: Diagnosis not present

## 2021-05-12 DIAGNOSIS — M5010 Cervical disc disorder with radiculopathy, unspecified cervical region: Secondary | ICD-10-CM | POA: Diagnosis not present

## 2021-05-12 DIAGNOSIS — Z8673 Personal history of transient ischemic attack (TIA), and cerebral infarction without residual deficits: Secondary | ICD-10-CM | POA: Diagnosis not present

## 2021-05-12 DIAGNOSIS — R21 Rash and other nonspecific skin eruption: Secondary | ICD-10-CM | POA: Diagnosis not present

## 2021-05-12 DIAGNOSIS — E039 Hypothyroidism, unspecified: Secondary | ICD-10-CM | POA: Diagnosis not present

## 2021-05-12 DIAGNOSIS — M4722 Other spondylosis with radiculopathy, cervical region: Secondary | ICD-10-CM | POA: Diagnosis not present

## 2021-05-12 DIAGNOSIS — E222 Syndrome of inappropriate secretion of antidiuretic hormone: Secondary | ICD-10-CM | POA: Diagnosis not present

## 2021-05-12 DIAGNOSIS — G40909 Epilepsy, unspecified, not intractable, without status epilepticus: Secondary | ICD-10-CM | POA: Diagnosis not present

## 2021-05-12 DIAGNOSIS — K635 Polyp of colon: Secondary | ICD-10-CM | POA: Diagnosis not present

## 2021-05-12 DIAGNOSIS — Z7982 Long term (current) use of aspirin: Secondary | ICD-10-CM | POA: Diagnosis not present

## 2021-05-12 DIAGNOSIS — E1142 Type 2 diabetes mellitus with diabetic polyneuropathy: Secondary | ICD-10-CM | POA: Diagnosis not present

## 2021-05-12 DIAGNOSIS — K219 Gastro-esophageal reflux disease without esophagitis: Secondary | ICD-10-CM | POA: Diagnosis not present

## 2021-05-12 DIAGNOSIS — Z794 Long term (current) use of insulin: Secondary | ICD-10-CM | POA: Diagnosis not present

## 2021-05-12 DIAGNOSIS — M810 Age-related osteoporosis without current pathological fracture: Secondary | ICD-10-CM | POA: Diagnosis not present

## 2021-05-12 DIAGNOSIS — I700 Atherosclerosis of aorta: Secondary | ICD-10-CM | POA: Diagnosis not present

## 2021-05-12 DIAGNOSIS — Z9181 History of falling: Secondary | ICD-10-CM | POA: Diagnosis not present

## 2021-05-12 DIAGNOSIS — M25471 Effusion, right ankle: Secondary | ICD-10-CM | POA: Diagnosis not present

## 2021-05-12 DIAGNOSIS — E782 Mixed hyperlipidemia: Secondary | ICD-10-CM | POA: Diagnosis not present

## 2021-05-13 DIAGNOSIS — Z794 Long term (current) use of insulin: Secondary | ICD-10-CM | POA: Diagnosis not present

## 2021-05-13 DIAGNOSIS — E1142 Type 2 diabetes mellitus with diabetic polyneuropathy: Secondary | ICD-10-CM | POA: Diagnosis not present

## 2021-05-13 DIAGNOSIS — E1165 Type 2 diabetes mellitus with hyperglycemia: Secondary | ICD-10-CM | POA: Diagnosis not present

## 2021-05-13 DIAGNOSIS — E039 Hypothyroidism, unspecified: Secondary | ICD-10-CM | POA: Diagnosis not present

## 2021-05-15 DIAGNOSIS — J3489 Other specified disorders of nose and nasal sinuses: Secondary | ICD-10-CM | POA: Diagnosis not present

## 2021-05-15 DIAGNOSIS — R0989 Other specified symptoms and signs involving the circulatory and respiratory systems: Secondary | ICD-10-CM | POA: Diagnosis not present

## 2021-05-15 DIAGNOSIS — R1312 Dysphagia, oropharyngeal phase: Secondary | ICD-10-CM | POA: Diagnosis present

## 2021-05-16 ENCOUNTER — Other Ambulatory Visit: Payer: Self-pay | Admitting: Physician Assistant

## 2021-05-16 DIAGNOSIS — M810 Age-related osteoporosis without current pathological fracture: Secondary | ICD-10-CM | POA: Diagnosis not present

## 2021-05-16 DIAGNOSIS — Z7982 Long term (current) use of aspirin: Secondary | ICD-10-CM | POA: Diagnosis not present

## 2021-05-16 DIAGNOSIS — I700 Atherosclerosis of aorta: Secondary | ICD-10-CM | POA: Diagnosis not present

## 2021-05-16 DIAGNOSIS — R21 Rash and other nonspecific skin eruption: Secondary | ICD-10-CM | POA: Diagnosis not present

## 2021-05-16 DIAGNOSIS — E1122 Type 2 diabetes mellitus with diabetic chronic kidney disease: Secondary | ICD-10-CM | POA: Diagnosis not present

## 2021-05-16 DIAGNOSIS — Z8673 Personal history of transient ischemic attack (TIA), and cerebral infarction without residual deficits: Secondary | ICD-10-CM | POA: Diagnosis not present

## 2021-05-16 DIAGNOSIS — I1310 Hypertensive heart and chronic kidney disease without heart failure, with stage 1 through stage 4 chronic kidney disease, or unspecified chronic kidney disease: Secondary | ICD-10-CM | POA: Diagnosis not present

## 2021-05-16 DIAGNOSIS — E1151 Type 2 diabetes mellitus with diabetic peripheral angiopathy without gangrene: Secondary | ICD-10-CM | POA: Diagnosis not present

## 2021-05-16 DIAGNOSIS — M170 Bilateral primary osteoarthritis of knee: Secondary | ICD-10-CM | POA: Diagnosis not present

## 2021-05-16 DIAGNOSIS — Z79899 Other long term (current) drug therapy: Secondary | ICD-10-CM | POA: Diagnosis not present

## 2021-05-16 DIAGNOSIS — N1832 Chronic kidney disease, stage 3b: Secondary | ICD-10-CM | POA: Diagnosis not present

## 2021-05-16 DIAGNOSIS — G40909 Epilepsy, unspecified, not intractable, without status epilepticus: Secondary | ICD-10-CM | POA: Diagnosis not present

## 2021-05-16 DIAGNOSIS — M25471 Effusion, right ankle: Secondary | ICD-10-CM | POA: Diagnosis not present

## 2021-05-16 DIAGNOSIS — R1312 Dysphagia, oropharyngeal phase: Secondary | ICD-10-CM

## 2021-05-16 DIAGNOSIS — E782 Mixed hyperlipidemia: Secondary | ICD-10-CM | POA: Diagnosis not present

## 2021-05-16 DIAGNOSIS — E222 Syndrome of inappropriate secretion of antidiuretic hormone: Secondary | ICD-10-CM | POA: Diagnosis not present

## 2021-05-16 DIAGNOSIS — Z9181 History of falling: Secondary | ICD-10-CM | POA: Diagnosis not present

## 2021-05-16 DIAGNOSIS — M4722 Other spondylosis with radiculopathy, cervical region: Secondary | ICD-10-CM | POA: Diagnosis not present

## 2021-05-16 DIAGNOSIS — M5010 Cervical disc disorder with radiculopathy, unspecified cervical region: Secondary | ICD-10-CM | POA: Diagnosis not present

## 2021-05-16 DIAGNOSIS — K219 Gastro-esophageal reflux disease without esophagitis: Secondary | ICD-10-CM | POA: Diagnosis not present

## 2021-05-16 DIAGNOSIS — Z794 Long term (current) use of insulin: Secondary | ICD-10-CM | POA: Diagnosis not present

## 2021-05-16 DIAGNOSIS — E1142 Type 2 diabetes mellitus with diabetic polyneuropathy: Secondary | ICD-10-CM | POA: Diagnosis not present

## 2021-05-16 DIAGNOSIS — D631 Anemia in chronic kidney disease: Secondary | ICD-10-CM | POA: Diagnosis not present

## 2021-05-16 DIAGNOSIS — E039 Hypothyroidism, unspecified: Secondary | ICD-10-CM | POA: Diagnosis not present

## 2021-05-16 DIAGNOSIS — K635 Polyp of colon: Secondary | ICD-10-CM | POA: Diagnosis not present

## 2021-05-16 DIAGNOSIS — M4802 Spinal stenosis, cervical region: Secondary | ICD-10-CM | POA: Diagnosis not present

## 2021-05-17 DIAGNOSIS — I10 Essential (primary) hypertension: Secondary | ICD-10-CM | POA: Diagnosis not present

## 2021-05-17 DIAGNOSIS — E785 Hyperlipidemia, unspecified: Secondary | ICD-10-CM | POA: Diagnosis not present

## 2021-05-17 DIAGNOSIS — K219 Gastro-esophageal reflux disease without esophagitis: Secondary | ICD-10-CM | POA: Diagnosis not present

## 2021-05-17 DIAGNOSIS — G3184 Mild cognitive impairment, so stated: Secondary | ICD-10-CM | POA: Diagnosis not present

## 2021-05-17 DIAGNOSIS — E1165 Type 2 diabetes mellitus with hyperglycemia: Secondary | ICD-10-CM | POA: Diagnosis not present

## 2021-05-17 DIAGNOSIS — R569 Unspecified convulsions: Secondary | ICD-10-CM | POA: Diagnosis not present

## 2021-05-17 DIAGNOSIS — K5900 Constipation, unspecified: Secondary | ICD-10-CM | POA: Diagnosis not present

## 2021-05-17 DIAGNOSIS — E039 Hypothyroidism, unspecified: Secondary | ICD-10-CM | POA: Diagnosis not present

## 2021-05-17 DIAGNOSIS — Z794 Long term (current) use of insulin: Secondary | ICD-10-CM | POA: Diagnosis not present

## 2021-05-17 DIAGNOSIS — L4050 Arthropathic psoriasis, unspecified: Secondary | ICD-10-CM | POA: Diagnosis not present

## 2021-05-21 DIAGNOSIS — E782 Mixed hyperlipidemia: Secondary | ICD-10-CM | POA: Diagnosis not present

## 2021-05-21 DIAGNOSIS — E1122 Type 2 diabetes mellitus with diabetic chronic kidney disease: Secondary | ICD-10-CM | POA: Diagnosis not present

## 2021-05-21 DIAGNOSIS — E1151 Type 2 diabetes mellitus with diabetic peripheral angiopathy without gangrene: Secondary | ICD-10-CM | POA: Diagnosis not present

## 2021-05-21 DIAGNOSIS — M810 Age-related osteoporosis without current pathological fracture: Secondary | ICD-10-CM | POA: Diagnosis not present

## 2021-05-21 DIAGNOSIS — R1314 Dysphagia, pharyngoesophageal phase: Secondary | ICD-10-CM | POA: Diagnosis not present

## 2021-05-21 DIAGNOSIS — G40909 Epilepsy, unspecified, not intractable, without status epilepticus: Secondary | ICD-10-CM | POA: Diagnosis not present

## 2021-05-21 DIAGNOSIS — B370 Candidal stomatitis: Secondary | ICD-10-CM | POA: Diagnosis not present

## 2021-05-21 DIAGNOSIS — E222 Syndrome of inappropriate secretion of antidiuretic hormone: Secondary | ICD-10-CM | POA: Diagnosis not present

## 2021-05-21 DIAGNOSIS — D631 Anemia in chronic kidney disease: Secondary | ICD-10-CM | POA: Diagnosis not present

## 2021-05-21 DIAGNOSIS — N1832 Chronic kidney disease, stage 3b: Secondary | ICD-10-CM | POA: Diagnosis not present

## 2021-05-21 DIAGNOSIS — Z7982 Long term (current) use of aspirin: Secondary | ICD-10-CM | POA: Diagnosis not present

## 2021-05-21 DIAGNOSIS — M5010 Cervical disc disorder with radiculopathy, unspecified cervical region: Secondary | ICD-10-CM | POA: Diagnosis not present

## 2021-05-21 DIAGNOSIS — E1142 Type 2 diabetes mellitus with diabetic polyneuropathy: Secondary | ICD-10-CM | POA: Diagnosis not present

## 2021-05-21 DIAGNOSIS — I1310 Hypertensive heart and chronic kidney disease without heart failure, with stage 1 through stage 4 chronic kidney disease, or unspecified chronic kidney disease: Secondary | ICD-10-CM | POA: Diagnosis not present

## 2021-05-21 DIAGNOSIS — Z6821 Body mass index (BMI) 21.0-21.9, adult: Secondary | ICD-10-CM | POA: Diagnosis not present

## 2021-05-21 DIAGNOSIS — E039 Hypothyroidism, unspecified: Secondary | ICD-10-CM | POA: Diagnosis not present

## 2021-05-21 DIAGNOSIS — M4722 Other spondylosis with radiculopathy, cervical region: Secondary | ICD-10-CM | POA: Diagnosis not present

## 2021-05-21 DIAGNOSIS — M170 Bilateral primary osteoarthritis of knee: Secondary | ICD-10-CM | POA: Diagnosis not present

## 2021-05-21 DIAGNOSIS — Z79899 Other long term (current) drug therapy: Secondary | ICD-10-CM | POA: Diagnosis not present

## 2021-05-21 DIAGNOSIS — M6281 Muscle weakness (generalized): Secondary | ICD-10-CM | POA: Diagnosis not present

## 2021-05-21 DIAGNOSIS — Z8673 Personal history of transient ischemic attack (TIA), and cerebral infarction without residual deficits: Secondary | ICD-10-CM | POA: Diagnosis not present

## 2021-05-21 DIAGNOSIS — M25471 Effusion, right ankle: Secondary | ICD-10-CM | POA: Diagnosis not present

## 2021-05-21 DIAGNOSIS — M542 Cervicalgia: Secondary | ICD-10-CM | POA: Diagnosis not present

## 2021-05-21 DIAGNOSIS — R5383 Other fatigue: Secondary | ICD-10-CM | POA: Diagnosis not present

## 2021-05-21 DIAGNOSIS — K635 Polyp of colon: Secondary | ICD-10-CM | POA: Diagnosis not present

## 2021-05-21 DIAGNOSIS — M4802 Spinal stenosis, cervical region: Secondary | ICD-10-CM | POA: Diagnosis not present

## 2021-05-21 DIAGNOSIS — K219 Gastro-esophageal reflux disease without esophagitis: Secondary | ICD-10-CM | POA: Diagnosis not present

## 2021-05-21 DIAGNOSIS — Z9181 History of falling: Secondary | ICD-10-CM | POA: Diagnosis not present

## 2021-05-21 DIAGNOSIS — R21 Rash and other nonspecific skin eruption: Secondary | ICD-10-CM | POA: Diagnosis not present

## 2021-05-21 DIAGNOSIS — Z794 Long term (current) use of insulin: Secondary | ICD-10-CM | POA: Diagnosis not present

## 2021-05-21 DIAGNOSIS — I700 Atherosclerosis of aorta: Secondary | ICD-10-CM | POA: Diagnosis not present

## 2021-05-21 LAB — CUP PACEART REMOTE DEVICE CHECK
Date Time Interrogation Session: 20221101230206
Implantable Pulse Generator Implant Date: 20210203

## 2021-05-23 DIAGNOSIS — I1310 Hypertensive heart and chronic kidney disease without heart failure, with stage 1 through stage 4 chronic kidney disease, or unspecified chronic kidney disease: Secondary | ICD-10-CM | POA: Diagnosis not present

## 2021-05-23 DIAGNOSIS — E782 Mixed hyperlipidemia: Secondary | ICD-10-CM | POA: Diagnosis not present

## 2021-05-23 DIAGNOSIS — M4722 Other spondylosis with radiculopathy, cervical region: Secondary | ICD-10-CM | POA: Diagnosis not present

## 2021-05-23 DIAGNOSIS — Z7982 Long term (current) use of aspirin: Secondary | ICD-10-CM | POA: Diagnosis not present

## 2021-05-23 DIAGNOSIS — M4802 Spinal stenosis, cervical region: Secondary | ICD-10-CM | POA: Diagnosis not present

## 2021-05-23 DIAGNOSIS — M810 Age-related osteoporosis without current pathological fracture: Secondary | ICD-10-CM | POA: Diagnosis not present

## 2021-05-23 DIAGNOSIS — Z8673 Personal history of transient ischemic attack (TIA), and cerebral infarction without residual deficits: Secondary | ICD-10-CM | POA: Diagnosis not present

## 2021-05-23 DIAGNOSIS — D631 Anemia in chronic kidney disease: Secondary | ICD-10-CM | POA: Diagnosis not present

## 2021-05-23 DIAGNOSIS — M5010 Cervical disc disorder with radiculopathy, unspecified cervical region: Secondary | ICD-10-CM | POA: Diagnosis not present

## 2021-05-23 DIAGNOSIS — M25471 Effusion, right ankle: Secondary | ICD-10-CM | POA: Diagnosis not present

## 2021-05-23 DIAGNOSIS — K635 Polyp of colon: Secondary | ICD-10-CM | POA: Diagnosis not present

## 2021-05-23 DIAGNOSIS — I700 Atherosclerosis of aorta: Secondary | ICD-10-CM | POA: Diagnosis not present

## 2021-05-23 DIAGNOSIS — E1142 Type 2 diabetes mellitus with diabetic polyneuropathy: Secondary | ICD-10-CM | POA: Diagnosis not present

## 2021-05-23 DIAGNOSIS — K219 Gastro-esophageal reflux disease without esophagitis: Secondary | ICD-10-CM | POA: Diagnosis not present

## 2021-05-23 DIAGNOSIS — E039 Hypothyroidism, unspecified: Secondary | ICD-10-CM | POA: Diagnosis not present

## 2021-05-23 DIAGNOSIS — E1151 Type 2 diabetes mellitus with diabetic peripheral angiopathy without gangrene: Secondary | ICD-10-CM | POA: Diagnosis not present

## 2021-05-23 DIAGNOSIS — N1832 Chronic kidney disease, stage 3b: Secondary | ICD-10-CM | POA: Diagnosis not present

## 2021-05-23 DIAGNOSIS — E1122 Type 2 diabetes mellitus with diabetic chronic kidney disease: Secondary | ICD-10-CM | POA: Diagnosis not present

## 2021-05-23 DIAGNOSIS — G40909 Epilepsy, unspecified, not intractable, without status epilepticus: Secondary | ICD-10-CM | POA: Diagnosis not present

## 2021-05-23 DIAGNOSIS — R21 Rash and other nonspecific skin eruption: Secondary | ICD-10-CM | POA: Diagnosis not present

## 2021-05-23 DIAGNOSIS — Z79899 Other long term (current) drug therapy: Secondary | ICD-10-CM | POA: Diagnosis not present

## 2021-05-23 DIAGNOSIS — Z794 Long term (current) use of insulin: Secondary | ICD-10-CM | POA: Diagnosis not present

## 2021-05-23 DIAGNOSIS — Z9181 History of falling: Secondary | ICD-10-CM | POA: Diagnosis not present

## 2021-05-23 DIAGNOSIS — E222 Syndrome of inappropriate secretion of antidiuretic hormone: Secondary | ICD-10-CM | POA: Diagnosis not present

## 2021-05-23 DIAGNOSIS — M170 Bilateral primary osteoarthritis of knee: Secondary | ICD-10-CM | POA: Diagnosis not present

## 2021-05-26 ENCOUNTER — Ambulatory Visit (INDEPENDENT_AMBULATORY_CARE_PROVIDER_SITE_OTHER): Payer: Medicare HMO

## 2021-05-26 DIAGNOSIS — I6389 Other cerebral infarction: Secondary | ICD-10-CM

## 2021-05-27 ENCOUNTER — Encounter: Payer: Self-pay | Admitting: Neurology

## 2021-05-27 DIAGNOSIS — M25471 Effusion, right ankle: Secondary | ICD-10-CM | POA: Diagnosis not present

## 2021-05-27 DIAGNOSIS — Z7982 Long term (current) use of aspirin: Secondary | ICD-10-CM | POA: Diagnosis not present

## 2021-05-27 DIAGNOSIS — M4802 Spinal stenosis, cervical region: Secondary | ICD-10-CM | POA: Diagnosis not present

## 2021-05-27 DIAGNOSIS — E1122 Type 2 diabetes mellitus with diabetic chronic kidney disease: Secondary | ICD-10-CM | POA: Diagnosis not present

## 2021-05-27 DIAGNOSIS — E1142 Type 2 diabetes mellitus with diabetic polyneuropathy: Secondary | ICD-10-CM | POA: Diagnosis not present

## 2021-05-27 DIAGNOSIS — M170 Bilateral primary osteoarthritis of knee: Secondary | ICD-10-CM | POA: Diagnosis not present

## 2021-05-27 DIAGNOSIS — I1310 Hypertensive heart and chronic kidney disease without heart failure, with stage 1 through stage 4 chronic kidney disease, or unspecified chronic kidney disease: Secondary | ICD-10-CM | POA: Diagnosis not present

## 2021-05-27 DIAGNOSIS — M5010 Cervical disc disorder with radiculopathy, unspecified cervical region: Secondary | ICD-10-CM | POA: Diagnosis not present

## 2021-05-27 DIAGNOSIS — K219 Gastro-esophageal reflux disease without esophagitis: Secondary | ICD-10-CM | POA: Diagnosis not present

## 2021-05-27 DIAGNOSIS — Z794 Long term (current) use of insulin: Secondary | ICD-10-CM | POA: Diagnosis not present

## 2021-05-27 DIAGNOSIS — K635 Polyp of colon: Secondary | ICD-10-CM | POA: Diagnosis not present

## 2021-05-27 DIAGNOSIS — E782 Mixed hyperlipidemia: Secondary | ICD-10-CM | POA: Diagnosis not present

## 2021-05-27 DIAGNOSIS — Z8673 Personal history of transient ischemic attack (TIA), and cerebral infarction without residual deficits: Secondary | ICD-10-CM | POA: Diagnosis not present

## 2021-05-27 DIAGNOSIS — E1151 Type 2 diabetes mellitus with diabetic peripheral angiopathy without gangrene: Secondary | ICD-10-CM | POA: Diagnosis not present

## 2021-05-27 DIAGNOSIS — D631 Anemia in chronic kidney disease: Secondary | ICD-10-CM | POA: Diagnosis not present

## 2021-05-27 DIAGNOSIS — E222 Syndrome of inappropriate secretion of antidiuretic hormone: Secondary | ICD-10-CM | POA: Diagnosis not present

## 2021-05-27 DIAGNOSIS — R21 Rash and other nonspecific skin eruption: Secondary | ICD-10-CM | POA: Diagnosis not present

## 2021-05-27 DIAGNOSIS — M4722 Other spondylosis with radiculopathy, cervical region: Secondary | ICD-10-CM | POA: Diagnosis not present

## 2021-05-27 DIAGNOSIS — M810 Age-related osteoporosis without current pathological fracture: Secondary | ICD-10-CM | POA: Diagnosis not present

## 2021-05-27 DIAGNOSIS — G40909 Epilepsy, unspecified, not intractable, without status epilepticus: Secondary | ICD-10-CM | POA: Diagnosis not present

## 2021-05-27 DIAGNOSIS — N1832 Chronic kidney disease, stage 3b: Secondary | ICD-10-CM | POA: Diagnosis not present

## 2021-05-27 DIAGNOSIS — E039 Hypothyroidism, unspecified: Secondary | ICD-10-CM | POA: Diagnosis not present

## 2021-05-27 DIAGNOSIS — I700 Atherosclerosis of aorta: Secondary | ICD-10-CM | POA: Diagnosis not present

## 2021-05-27 DIAGNOSIS — Z9181 History of falling: Secondary | ICD-10-CM | POA: Diagnosis not present

## 2021-05-27 DIAGNOSIS — Z79899 Other long term (current) drug therapy: Secondary | ICD-10-CM | POA: Diagnosis not present

## 2021-05-28 DIAGNOSIS — G40909 Epilepsy, unspecified, not intractable, without status epilepticus: Secondary | ICD-10-CM | POA: Diagnosis not present

## 2021-05-28 DIAGNOSIS — D631 Anemia in chronic kidney disease: Secondary | ICD-10-CM | POA: Diagnosis not present

## 2021-05-28 DIAGNOSIS — K635 Polyp of colon: Secondary | ICD-10-CM | POA: Diagnosis not present

## 2021-05-28 DIAGNOSIS — K219 Gastro-esophageal reflux disease without esophagitis: Secondary | ICD-10-CM | POA: Diagnosis not present

## 2021-05-28 DIAGNOSIS — M25471 Effusion, right ankle: Secondary | ICD-10-CM | POA: Diagnosis not present

## 2021-05-28 DIAGNOSIS — E782 Mixed hyperlipidemia: Secondary | ICD-10-CM | POA: Diagnosis not present

## 2021-05-28 DIAGNOSIS — M4802 Spinal stenosis, cervical region: Secondary | ICD-10-CM | POA: Diagnosis not present

## 2021-05-28 DIAGNOSIS — E222 Syndrome of inappropriate secretion of antidiuretic hormone: Secondary | ICD-10-CM | POA: Diagnosis not present

## 2021-05-28 DIAGNOSIS — N1832 Chronic kidney disease, stage 3b: Secondary | ICD-10-CM | POA: Diagnosis not present

## 2021-05-28 DIAGNOSIS — R21 Rash and other nonspecific skin eruption: Secondary | ICD-10-CM | POA: Diagnosis not present

## 2021-05-28 DIAGNOSIS — Z79899 Other long term (current) drug therapy: Secondary | ICD-10-CM | POA: Diagnosis not present

## 2021-05-28 DIAGNOSIS — E039 Hypothyroidism, unspecified: Secondary | ICD-10-CM | POA: Diagnosis not present

## 2021-05-28 DIAGNOSIS — E1151 Type 2 diabetes mellitus with diabetic peripheral angiopathy without gangrene: Secondary | ICD-10-CM | POA: Diagnosis not present

## 2021-05-28 DIAGNOSIS — M5010 Cervical disc disorder with radiculopathy, unspecified cervical region: Secondary | ICD-10-CM | POA: Diagnosis not present

## 2021-05-28 DIAGNOSIS — Z794 Long term (current) use of insulin: Secondary | ICD-10-CM | POA: Diagnosis not present

## 2021-05-28 DIAGNOSIS — Z9181 History of falling: Secondary | ICD-10-CM | POA: Diagnosis not present

## 2021-05-28 DIAGNOSIS — Z8673 Personal history of transient ischemic attack (TIA), and cerebral infarction without residual deficits: Secondary | ICD-10-CM | POA: Diagnosis not present

## 2021-05-28 DIAGNOSIS — I1310 Hypertensive heart and chronic kidney disease without heart failure, with stage 1 through stage 4 chronic kidney disease, or unspecified chronic kidney disease: Secondary | ICD-10-CM | POA: Diagnosis not present

## 2021-05-28 DIAGNOSIS — Z7982 Long term (current) use of aspirin: Secondary | ICD-10-CM | POA: Diagnosis not present

## 2021-05-28 DIAGNOSIS — M170 Bilateral primary osteoarthritis of knee: Secondary | ICD-10-CM | POA: Diagnosis not present

## 2021-05-28 DIAGNOSIS — E1142 Type 2 diabetes mellitus with diabetic polyneuropathy: Secondary | ICD-10-CM | POA: Diagnosis not present

## 2021-05-28 DIAGNOSIS — I700 Atherosclerosis of aorta: Secondary | ICD-10-CM | POA: Diagnosis not present

## 2021-05-28 DIAGNOSIS — E1122 Type 2 diabetes mellitus with diabetic chronic kidney disease: Secondary | ICD-10-CM | POA: Diagnosis not present

## 2021-05-28 DIAGNOSIS — M4722 Other spondylosis with radiculopathy, cervical region: Secondary | ICD-10-CM | POA: Diagnosis not present

## 2021-05-28 DIAGNOSIS — M810 Age-related osteoporosis without current pathological fracture: Secondary | ICD-10-CM | POA: Diagnosis not present

## 2021-05-30 DIAGNOSIS — R21 Rash and other nonspecific skin eruption: Secondary | ICD-10-CM | POA: Diagnosis not present

## 2021-05-30 DIAGNOSIS — E1151 Type 2 diabetes mellitus with diabetic peripheral angiopathy without gangrene: Secondary | ICD-10-CM | POA: Diagnosis not present

## 2021-05-30 DIAGNOSIS — N1832 Chronic kidney disease, stage 3b: Secondary | ICD-10-CM | POA: Diagnosis not present

## 2021-05-30 DIAGNOSIS — G40909 Epilepsy, unspecified, not intractable, without status epilepticus: Secondary | ICD-10-CM | POA: Diagnosis not present

## 2021-05-30 DIAGNOSIS — E039 Hypothyroidism, unspecified: Secondary | ICD-10-CM | POA: Diagnosis not present

## 2021-05-30 DIAGNOSIS — I700 Atherosclerosis of aorta: Secondary | ICD-10-CM | POA: Diagnosis not present

## 2021-05-30 DIAGNOSIS — Z79899 Other long term (current) drug therapy: Secondary | ICD-10-CM | POA: Diagnosis not present

## 2021-05-30 DIAGNOSIS — I1310 Hypertensive heart and chronic kidney disease without heart failure, with stage 1 through stage 4 chronic kidney disease, or unspecified chronic kidney disease: Secondary | ICD-10-CM | POA: Diagnosis not present

## 2021-05-30 DIAGNOSIS — D631 Anemia in chronic kidney disease: Secondary | ICD-10-CM | POA: Diagnosis not present

## 2021-05-30 DIAGNOSIS — M5010 Cervical disc disorder with radiculopathy, unspecified cervical region: Secondary | ICD-10-CM | POA: Diagnosis not present

## 2021-05-30 DIAGNOSIS — K219 Gastro-esophageal reflux disease without esophagitis: Secondary | ICD-10-CM | POA: Diagnosis not present

## 2021-05-30 DIAGNOSIS — M4722 Other spondylosis with radiculopathy, cervical region: Secondary | ICD-10-CM | POA: Diagnosis not present

## 2021-05-30 DIAGNOSIS — Z8673 Personal history of transient ischemic attack (TIA), and cerebral infarction without residual deficits: Secondary | ICD-10-CM | POA: Diagnosis not present

## 2021-05-30 DIAGNOSIS — Z9181 History of falling: Secondary | ICD-10-CM | POA: Diagnosis not present

## 2021-05-30 DIAGNOSIS — M25471 Effusion, right ankle: Secondary | ICD-10-CM | POA: Diagnosis not present

## 2021-05-30 DIAGNOSIS — Z7982 Long term (current) use of aspirin: Secondary | ICD-10-CM | POA: Diagnosis not present

## 2021-05-30 DIAGNOSIS — M4802 Spinal stenosis, cervical region: Secondary | ICD-10-CM | POA: Diagnosis not present

## 2021-05-30 DIAGNOSIS — E222 Syndrome of inappropriate secretion of antidiuretic hormone: Secondary | ICD-10-CM | POA: Diagnosis not present

## 2021-05-30 DIAGNOSIS — E1122 Type 2 diabetes mellitus with diabetic chronic kidney disease: Secondary | ICD-10-CM | POA: Diagnosis not present

## 2021-05-30 DIAGNOSIS — M810 Age-related osteoporosis without current pathological fracture: Secondary | ICD-10-CM | POA: Diagnosis not present

## 2021-05-30 DIAGNOSIS — Z794 Long term (current) use of insulin: Secondary | ICD-10-CM | POA: Diagnosis not present

## 2021-05-30 DIAGNOSIS — M170 Bilateral primary osteoarthritis of knee: Secondary | ICD-10-CM | POA: Diagnosis not present

## 2021-05-30 DIAGNOSIS — E782 Mixed hyperlipidemia: Secondary | ICD-10-CM | POA: Diagnosis not present

## 2021-05-30 DIAGNOSIS — E1142 Type 2 diabetes mellitus with diabetic polyneuropathy: Secondary | ICD-10-CM | POA: Diagnosis not present

## 2021-05-30 DIAGNOSIS — K635 Polyp of colon: Secondary | ICD-10-CM | POA: Diagnosis not present

## 2021-06-02 DIAGNOSIS — I1310 Hypertensive heart and chronic kidney disease without heart failure, with stage 1 through stage 4 chronic kidney disease, or unspecified chronic kidney disease: Secondary | ICD-10-CM | POA: Diagnosis not present

## 2021-06-02 DIAGNOSIS — E1142 Type 2 diabetes mellitus with diabetic polyneuropathy: Secondary | ICD-10-CM | POA: Diagnosis not present

## 2021-06-02 DIAGNOSIS — M170 Bilateral primary osteoarthritis of knee: Secondary | ICD-10-CM | POA: Diagnosis not present

## 2021-06-02 DIAGNOSIS — E1122 Type 2 diabetes mellitus with diabetic chronic kidney disease: Secondary | ICD-10-CM | POA: Diagnosis not present

## 2021-06-02 DIAGNOSIS — Z8673 Personal history of transient ischemic attack (TIA), and cerebral infarction without residual deficits: Secondary | ICD-10-CM | POA: Diagnosis not present

## 2021-06-02 DIAGNOSIS — M25471 Effusion, right ankle: Secondary | ICD-10-CM | POA: Diagnosis not present

## 2021-06-02 DIAGNOSIS — M810 Age-related osteoporosis without current pathological fracture: Secondary | ICD-10-CM | POA: Diagnosis not present

## 2021-06-02 DIAGNOSIS — Z7982 Long term (current) use of aspirin: Secondary | ICD-10-CM | POA: Diagnosis not present

## 2021-06-02 DIAGNOSIS — M4802 Spinal stenosis, cervical region: Secondary | ICD-10-CM | POA: Diagnosis not present

## 2021-06-02 DIAGNOSIS — Z79899 Other long term (current) drug therapy: Secondary | ICD-10-CM | POA: Diagnosis not present

## 2021-06-02 DIAGNOSIS — E222 Syndrome of inappropriate secretion of antidiuretic hormone: Secondary | ICD-10-CM | POA: Diagnosis not present

## 2021-06-02 DIAGNOSIS — M4722 Other spondylosis with radiculopathy, cervical region: Secondary | ICD-10-CM | POA: Diagnosis not present

## 2021-06-02 DIAGNOSIS — E039 Hypothyroidism, unspecified: Secondary | ICD-10-CM | POA: Diagnosis not present

## 2021-06-02 DIAGNOSIS — E1151 Type 2 diabetes mellitus with diabetic peripheral angiopathy without gangrene: Secondary | ICD-10-CM | POA: Diagnosis not present

## 2021-06-02 DIAGNOSIS — N1832 Chronic kidney disease, stage 3b: Secondary | ICD-10-CM | POA: Diagnosis not present

## 2021-06-02 DIAGNOSIS — D631 Anemia in chronic kidney disease: Secondary | ICD-10-CM | POA: Diagnosis not present

## 2021-06-02 DIAGNOSIS — Z794 Long term (current) use of insulin: Secondary | ICD-10-CM | POA: Diagnosis not present

## 2021-06-02 DIAGNOSIS — E782 Mixed hyperlipidemia: Secondary | ICD-10-CM | POA: Diagnosis not present

## 2021-06-02 DIAGNOSIS — M5010 Cervical disc disorder with radiculopathy, unspecified cervical region: Secondary | ICD-10-CM | POA: Diagnosis not present

## 2021-06-02 DIAGNOSIS — Z9181 History of falling: Secondary | ICD-10-CM | POA: Diagnosis not present

## 2021-06-02 DIAGNOSIS — K635 Polyp of colon: Secondary | ICD-10-CM | POA: Diagnosis not present

## 2021-06-02 DIAGNOSIS — R21 Rash and other nonspecific skin eruption: Secondary | ICD-10-CM | POA: Diagnosis not present

## 2021-06-02 DIAGNOSIS — G40909 Epilepsy, unspecified, not intractable, without status epilepticus: Secondary | ICD-10-CM | POA: Diagnosis not present

## 2021-06-02 DIAGNOSIS — K219 Gastro-esophageal reflux disease without esophagitis: Secondary | ICD-10-CM | POA: Diagnosis not present

## 2021-06-02 DIAGNOSIS — I700 Atherosclerosis of aorta: Secondary | ICD-10-CM | POA: Diagnosis not present

## 2021-06-02 NOTE — Progress Notes (Signed)
Carelink Summary Report / Loop Recorder 

## 2021-06-03 ENCOUNTER — Encounter: Payer: Self-pay | Admitting: *Deleted

## 2021-06-03 ENCOUNTER — Ambulatory Visit: Payer: Medicare HMO | Admitting: Diagnostic Neuroimaging

## 2021-06-03 ENCOUNTER — Other Ambulatory Visit: Payer: Self-pay | Admitting: *Deleted

## 2021-06-03 VITALS — BP 140/65 | HR 64 | Ht 66.0 in | Wt 125.8 lb

## 2021-06-03 DIAGNOSIS — M6281 Muscle weakness (generalized): Secondary | ICD-10-CM | POA: Diagnosis not present

## 2021-06-03 NOTE — Patient Instructions (Signed)
PROXIMAL MUSCLE WEAKNESS + rash + itching (ANA positive, ACE elevated; unclear etiology; also with chronic cervical spinal stenosis since 2015)  - not candidate for cervical spine surgery when last evaluated; hold off on repeat MRI cervical spine for now  - repeat CK, aldolase; check AchR ab  - follow up with Dr. Trudie Reed re: ANA positive and elevated ACE  - may consider EMG/NCS, however symptoms have improved with prednisone; also CK was previously normal, so myopathy / myositis less likely  - follow up with dermatology, rheumatology re: rash; may consider ID or allergy consults as well

## 2021-06-03 NOTE — Progress Notes (Signed)
[facility]  PATIENT: [name] [name] DOB: [birth year]  REFERRING CLINICIAN: Gavin Pound, MD HISTORY FROM: PATIENT  REASON FOR VISIT: NEW CONSULT   HISTORICAL  CHIEF COMPLAINT:  Chief Complaint  Patient presents with   Eval for myositis vs cervical spinal stenosis    Rm 7 New Pt , dgtr- Alesia    HISTORY OF PRESENT ILLNESS:   87 year old female here for evaluation of muscle weakness and rash.  Patient has long history of neck pain, upper and lower extremity weakness, from 2015 cervical spinal stenosis diagnosis.  This was followed up in 2020 with persistent C3-4 severe spinal stenosis.  However she is not felt to be a good surgical candidate at that time.  Conservative management was pursued.  In early 2022 patient had onset of diffuse itching sensation.  She started scratching herself over time.  Over past 3 months she developed rash in her chest, arms legs thighs and buttock region.  She saw dermatology who diagnosed eczema and tried topical creams.  Unfortunately the itching and rash continued.  No specific cause was found.  Skin biopsy was obtained but apparently not conclusive.  She was referred to rheumatology for evaluation for possible dermatomyositis due to progressive weakness.  Patient also had decreased appetite and weight loss.  Patient had been living alone and fully independent, driving, shopping and take care of her home.  Now patient needing more help from her family.  Lab testing was obtained and ANA was positive, ACE level elevated, and sed rate was slightly elevated.  CK level was normal.   REVIEW OF SYSTEMS: Full 14 system review of systems performed and negative with exception of: as per HPI.  ALLERGIES: Allergies  Allergen Reactions   Sulfonamide Derivatives Hives   Bactrim [Sulfamethoxazole-Trimethoprim] Rash    Reported by [facility]   Lisinopril Cough    Reported by [facility] MEDICATIONS: Outpatient  Medications Prior to Visit  Medication Sig Dispense Refill   acetaminophen (TYLENOL) 500 MG tablet Take 500-1,000 mg by mouth at bedtime as needed for mild pain.      amLODipine (NORVASC) 2.5 MG tablet 1 tablet     aspirin EC 81 MG tablet Take 81 mg by mouth daily.     atorvastatin (LIPITOR) 40 MG tablet Take 40 mg by mouth daily at 6 PM.      azelastine (ASTELIN) 0.1 % nasal spray Place 2 sprays into both nostrils 2 (two) times daily. Use in each nostril as directed 30 mL 2   B-D UF III MINI PEN NEEDLES 31G X 5 MM MISC USE AS DIRECTED TID     cetirizine (ZYRTEC) 10 MG tablet Take 10 mg by mouth daily as needed for allergies.     cholecalciferol (VITAMIN D) 1000 units tablet Take 1,000 Units by mouth daily.     conjugated estrogens (PREMARIN) vaginal cream See admin instructions.     famotidine (PEPCID) 20 MG tablet 1 tablet at bedtime as needed     insulin aspart (NOVOLOG FLEXPEN) 100 UNIT/ML FlexPen 8 to 10 units at breakfast 4 to 6 units at lunch and 2 to 4 units evening meal     insulin detemir (LEVEMIR FLEXTOUCH) 100 UNIT/ML FlexPen 7 units each morning, 3 units each evening     Iron-FA-B Cmp-C-Biot-Probiotic (FUSION PLUS) CAPS Take 1 capsule by mouth daily.      levETIRAcetam (KEPPRA) 500 MG tablet Take 1 tablet (500 mg total) by mouth 2 (two) times daily. Friendly  tablet 2   losartan (COZAAR) 50 MG tablet Take 50 mg by mouth daily.      mometasone (NASONEX) 50 MCG/ACT nasal spray 2 sprays in each nostril     Multiple Vitamin (MULTIVITAMIN WITH MINERALS) TABS tablet Take 1 tablet by mouth daily.     Multiple Vitamins-Minerals (MULTI VITAMIN/MINERALS) TABS 1 tablet     nystatin (MYCOSTATIN) 100000 UNIT/ML suspension Take 4 mLs by mouth at bedtime as needed.     Omega-3 Fatty Acids (FISH OIL) 1000 MG CAPS Take 1,000 mg by mouth 2 (two) times daily.     OneTouch Delica Lancets 82N MISC IC-10 CODE:  E11.65  Check blood sugar     ONETOUCH VERIO test strip 1 each 3 (three) times daily.      predniSONE (DELTASONE) 10 MG tablet Take by mouth. Last day 06/04/21     SYNTHROID 200 MCG tablet Take 200 mcg by mouth every morning.     terconazole (TERAZOL 3) 0.8 % vaginal cream Place 1 applicator vaginally at bedtime. 20 g 0   montelukast (SINGULAIR) 10 MG tablet Take 1 tablet (10 mg total) by mouth at bedtime. (Patient not taking: Reported on 06/03/2021) 30 tablet 5   pantoprazole (PROTONIX) 40 MG tablet Take 40 mg by mouth daily.  (Patient not taking: Reported on 06/03/2021)     calcium-vitamin D 250-100 MG-UNIT tablet Take 1 tablet by mouth daily.      polyethylene glycol (MIRALAX / GLYCOLAX) 17 g packet Take 17 g by mouth daily as needed for mild constipation.     No facility-administered medications prior to visit.    PAST MEDICAL HISTORY: Past Medical History:  Diagnosis Date   Arthritis    "knees, legs" (06/10/2016)   Ascites    Chronic kidney disease    "related to my diabetes"   Chronic lower back pain    Diabetes mellitus without complication (HCC)    Eczema    GERD (gastroesophageal reflux disease)    High cholesterol    Hyperlipidemia    Hypertension    Hypothyroidism    OAB (overactive bladder)    Osteoporosis    Thyroid disease    Type II diabetes mellitus ([place])    Urticaria     PAST SURGICAL HISTORY: Past Surgical History:  Procedure Laterality Date   ABDOMINAL HYSTERECTOMY     KNEE ARTHROSCOPY     LAPAROTOMY N/A 12/03/2016   Procedure: EXPLORATORY LAPAROTOMY, LYSIS OF ADHESIONS;  Surgeon: Armandina Gemma, MD;  Location: [facility];  Service: General;  Laterality: N/A;   LOOP RECORDER INSERTION N/A 08/23/2019   Procedure: LOOP RECORDER INSERTION;  Surgeon: Evans Lance, MD;  Location: [facility];  Service: Cardiovascular;  Laterality: N/A;   vocal cord polyps      FAMILY HISTORY: Family History  Problem Relation Age of Onset   Diabetes Mother    Hypertension Mother    Asthma Father    Diabetes Sister    Stomach cancer Sister    Diabetes  Brother    Allergic rhinitis Neg Hx    Eczema Neg Hx    Urticaria Neg Hx     SOCIAL HISTORY: Social History   Socioeconomic History   Marital status: Divorced    Spouse name: Not on file   Number of children: 5   Years of education: 11   Highest education level: Not on file  Occupational History   Not on file  Tobacco Use   Smoking status: Never   Smokeless  tobacco: Former    Types: Chew    Quit date: 1985  Vaping Use   Vaping Use: Never used  Substance and Sexual Activity   Alcohol use: No   Drug use: No   Sexual activity: Not Currently    Birth control/protection: Surgical    Comment: hysterectomy  Other Topics Concern   Not on file  Social History Narrative   06/03/21 lives alone   Social Determinants of Health   Financial Resource Strain: Not on file  Food Insecurity: Not on file  Transportation Needs: Not on file  Physical Activity: Not on file  Stress: Not on file  Social Connections: Not on file  Intimate Partner Violence: Not on file     PHYSICAL EXAM  GENERAL EXAM/CONSTITUTIONAL: Vitals:  Vitals:   06/03/21 1453  BP: 140/65  Pulse: 64  Weight: 125 lb 12.8 oz (57.1 kg)  Height: _0  (1.676 m)   Body mass index is 20.3 kg/m. Wt Readings from Last 3 Encounters:  06/03/21 125 lb 12.8 oz (57.1 kg)  10/17/20 142 lb (64.4 kg)  09/26/20 141 lb (64 kg)   Patient is in no distress; well developed, nourished and groomed; neck is supple  CARDIOVASCULAR: Examination of carotid arteries is normal; no carotid bruits Regular rate and rhythm, no murmurs Examination of peripheral vascular system by observation and palpation is normal  EYES: Ophthalmoscopic exam of optic discs and posterior segments is normal; no papilledema or hemorrhages No results found.  MUSCULOSKELETAL: Gait, strength, tone, movements noted in Neurologic exam below  NEUROLOGIC: MENTAL STATUS:  No flowsheet data found. awake, alert, oriented to person, place and  time recent and remote memory intact normal attention and concentration language fluent, comprehension intact, naming intact fund of knowledge appropriate  CRANIAL NERVE:  2nd - no papilledema on fundoscopic exam 2nd, 3rd, 4th, 6th - pupils equal and reactive to light, visual fields full to confrontation, extraocular muscles intact, no nystagmus 5th - facial sensation symmetric 7th - facial strength symmetric 8th - hearing intact 9th - palate elevates symmetrically, uvula midline 11th - shoulder shrug symmetric 12th - tongue protrusion midline  MOTOR:  normal bulk and tone, full strength in the BUE, BLE  SENSORY:  normal and symmetric to light touch, temperature, vibration  COORDINATION:  finger-nose-finger, fine finger movements normal  REFLEXES:  deep tendon reflexes 1+ and symmetric  GAIT/STATION:  narrow based gait; STOOPED POSTURE; SHORT STEPS     DIAGNOSTIC DATA (LABS, IMAGING, TESTING) - I reviewed patient records, labs, notes, testing and imaging myself where available.  Lab Results  Component Value Date   WBC 9.2 10/12/2019   HGB 9.6 (L) 10/12/2019   HCT 27.6 (L) 10/12/2019   MCV 93.9 10/12/2019   PLT 251 10/12/2019      Component Value Date/Time   NA 131 (L) 10/12/2019 0236   K 4.7 10/12/2019 0236   CL 97 (L) 10/12/2019 0236   CO2 26 10/12/2019 0236   GLUCOSE 153 (H) 10/12/2019 0236   BUN 21 10/12/2019 0236   CREATININE 1.10 (H) 10/12/2019 0236   CREATININE 0.98 09/25/2014 1654   CALCIUM 9.2 10/12/2019 0236   PROT 6.8 10/08/2019 0937   ALBUMIN 3.5 10/09/2019 0309   AST 35 10/08/2019 0937   ALT 32 10/08/2019 0937   ALKPHOS 66 10/08/2019 0937   BILITOT 0.7 10/08/2019 0937   GFRNONAA 46 (L) 10/12/2019 0236   GFRNONAA 55 (L) 09/25/2014 1654   GFRAA 54 (L) 10/12/2019 0236   GFRAA 64 09/25/2014  1654   Lab Results  Component Value Date   CHOL 272 (H) 10/09/2019   HDL 117 10/09/2019   LDLCALC 142 (H) 10/09/2019   LDLDIRECT 101.2 01/28/2007    TRIG 66 10/09/2019   TRIG 68 10/09/2019   CHOLHDL 2.3 10/09/2019   Lab Results  Component Value Date   HGBA1C 8.7 (H) 10/09/2019   Lab Results  Component Value Date   VITAMINB12 3,547 (H) 08/18/2019   Lab Results  Component Value Date   TSH 1.303 10/09/2019    03/22/14 MRI cervical spine [I reviewed images myself and agree with interpretation. -VRP]  - Advanced multilevel spondylosis has worsened at C3-4 where there is  marked flattening of the cord and severe bilateral foraminal  narrowing, worse on the right. Mild edema seen within the cord at  this level, new since the prior study.   Advanced degenerative disease at C4-5, C5-6 and C6-7 is unchanged in  appearance.   08/19/18 CT cervical [I reviewed images myself and agree with interpretation. -VRP]  1. Stable multilevel spondylosis of the cervical spine. 2. Severe central canal stenosis C3-4 and C4-5 with distortion of cord. 3. Moderate central canal narrowing at C5-6 and C6-7 with some ventral cord distortion. 4. Severe foraminal narrowing on the right at C3-4 and C6-7. 5. Severe foraminal narrowing bilaterally at C5-6 and C7-T1. 6. Moderate left foraminal narrowing at C2-3, C4-5, and C3-4.   11/2/2 Labs --> ACE 98 (high), ANA positive (1:1280), ESR 66, CK 96, ANCA negative, SPEP / UPEP negative    ASSESSMENT AND PLAN  87 y.o. year old female here with itching since beginning of 2022, rash and weakness since August 2022.  Unclear unifying diagnosis.  Itching could have been related to environmental factors versus autoimmune causes (lupus, sarcoidosis?).  Rash could have been autoimmune versus secondary to excessive scratching/excoriations.  Muscle weakness may have been related to baseline weakness from cervical spinal stenosis, with superimposed decreased p.o. intake, weight loss, underlying autoimmune factors.  Autoimmune/inflammatory myopathy/myositis possible but less likely with normal CK.  Symptoms are improving with  prednisone.   Dx:  1. Muscle weakness      PLAN:  PROXIMAL MUSCLE WEAKNESS + rash + itching (ANA positive, ACE elevated; unclear etiology; also with chronic cervical spinal stenosis since 2015) - not candidate for cervical spine surgery when last evaluated; hold off on repeat MRI cervical spine for now - repeat CK, aldolase; check AchR ab - follow up with Dr. Trudie Reed re: ANA positive and elevated ACE - may consider EMG/NCS, however symptoms have improved with prednisone; also CK was previously normal, so myopathy / myositis less likely - follow up with dermatology, rheumatology re: rash; may consider ID or allergy consults as well  Orders Placed This Encounter  Procedures   CK   AChR Abs with Reflex to MuSK   Aldolase   Return for pending if symptoms worsen or fail to improve, pending test results.    Penni Bombard, MD 62/22/9798, 9:21 PM Certified in Neurology, Neurophysiology and Neuroimaging  [facility] [address] [place], [place] [address] [phone]

## 2021-06-10 DIAGNOSIS — M6281 Muscle weakness (generalized): Secondary | ICD-10-CM | POA: Diagnosis not present

## 2021-06-10 DIAGNOSIS — M3390 Dermatopolymyositis, unspecified, organ involvement unspecified: Secondary | ICD-10-CM | POA: Diagnosis not present

## 2021-06-10 DIAGNOSIS — Z6820 Body mass index (BMI) 20.0-20.9, adult: Secondary | ICD-10-CM | POA: Diagnosis not present

## 2021-06-10 DIAGNOSIS — D649 Anemia, unspecified: Secondary | ICD-10-CM | POA: Diagnosis not present

## 2021-06-10 DIAGNOSIS — R21 Rash and other nonspecific skin eruption: Secondary | ICD-10-CM | POA: Diagnosis not present

## 2021-06-10 DIAGNOSIS — M4802 Spinal stenosis, cervical region: Secondary | ICD-10-CM | POA: Diagnosis not present

## 2021-06-10 DIAGNOSIS — R7989 Other specified abnormal findings of blood chemistry: Secondary | ICD-10-CM | POA: Diagnosis not present

## 2021-06-11 NOTE — Progress Notes (Signed)
Incomplete results, total lab report will be available by next week. CD

## 2021-06-15 LAB — MUSK ANTIBODIES: MuSK Antibodies: <1 U/mL

## 2021-06-15 LAB — CK: Total CK: 90 U/L (ref 26–161)

## 2021-06-15 LAB — ALDOLASE: Aldolase: 11.1 U/L — ABNORMAL HIGH (ref 3.3–10.3)

## 2021-06-15 LAB — ACHR ABS WITH REFLEX TO MUSK: AChR Binding Ab, Serum: <0.03 nmol/L (ref 0.00–0.24)

## 2021-06-17 DIAGNOSIS — M810 Age-related osteoporosis without current pathological fracture: Secondary | ICD-10-CM | POA: Diagnosis not present

## 2021-06-17 DIAGNOSIS — M170 Bilateral primary osteoarthritis of knee: Secondary | ICD-10-CM | POA: Diagnosis not present

## 2021-06-17 DIAGNOSIS — I1310 Hypertensive heart and chronic kidney disease without heart failure, with stage 1 through stage 4 chronic kidney disease, or unspecified chronic kidney disease: Secondary | ICD-10-CM | POA: Diagnosis not present

## 2021-06-17 DIAGNOSIS — D631 Anemia in chronic kidney disease: Secondary | ICD-10-CM | POA: Diagnosis not present

## 2021-06-17 DIAGNOSIS — Z794 Long term (current) use of insulin: Secondary | ICD-10-CM | POA: Diagnosis not present

## 2021-06-17 DIAGNOSIS — E1142 Type 2 diabetes mellitus with diabetic polyneuropathy: Secondary | ICD-10-CM | POA: Diagnosis not present

## 2021-06-17 DIAGNOSIS — M5010 Cervical disc disorder with radiculopathy, unspecified cervical region: Secondary | ICD-10-CM | POA: Diagnosis not present

## 2021-06-17 DIAGNOSIS — M4802 Spinal stenosis, cervical region: Secondary | ICD-10-CM | POA: Diagnosis not present

## 2021-06-17 DIAGNOSIS — M4722 Other spondylosis with radiculopathy, cervical region: Secondary | ICD-10-CM | POA: Diagnosis not present

## 2021-06-17 DIAGNOSIS — E782 Mixed hyperlipidemia: Secondary | ICD-10-CM | POA: Diagnosis not present

## 2021-06-17 DIAGNOSIS — K635 Polyp of colon: Secondary | ICD-10-CM | POA: Diagnosis not present

## 2021-06-17 DIAGNOSIS — E1122 Type 2 diabetes mellitus with diabetic chronic kidney disease: Secondary | ICD-10-CM | POA: Diagnosis not present

## 2021-06-17 DIAGNOSIS — E1151 Type 2 diabetes mellitus with diabetic peripheral angiopathy without gangrene: Secondary | ICD-10-CM | POA: Diagnosis not present

## 2021-06-17 DIAGNOSIS — Z79899 Other long term (current) drug therapy: Secondary | ICD-10-CM | POA: Diagnosis not present

## 2021-06-17 DIAGNOSIS — E039 Hypothyroidism, unspecified: Secondary | ICD-10-CM | POA: Diagnosis not present

## 2021-06-17 DIAGNOSIS — Z7982 Long term (current) use of aspirin: Secondary | ICD-10-CM | POA: Diagnosis not present

## 2021-06-17 DIAGNOSIS — Z9181 History of falling: Secondary | ICD-10-CM | POA: Diagnosis not present

## 2021-06-17 DIAGNOSIS — R21 Rash and other nonspecific skin eruption: Secondary | ICD-10-CM | POA: Diagnosis not present

## 2021-06-17 DIAGNOSIS — E222 Syndrome of inappropriate secretion of antidiuretic hormone: Secondary | ICD-10-CM | POA: Diagnosis not present

## 2021-06-17 DIAGNOSIS — I700 Atherosclerosis of aorta: Secondary | ICD-10-CM | POA: Diagnosis not present

## 2021-06-17 DIAGNOSIS — N1832 Chronic kidney disease, stage 3b: Secondary | ICD-10-CM | POA: Diagnosis not present

## 2021-06-17 DIAGNOSIS — Z8673 Personal history of transient ischemic attack (TIA), and cerebral infarction without residual deficits: Secondary | ICD-10-CM | POA: Diagnosis not present

## 2021-06-17 DIAGNOSIS — K219 Gastro-esophageal reflux disease without esophagitis: Secondary | ICD-10-CM | POA: Diagnosis not present

## 2021-06-17 DIAGNOSIS — M25471 Effusion, right ankle: Secondary | ICD-10-CM | POA: Diagnosis not present

## 2021-06-17 DIAGNOSIS — G40909 Epilepsy, unspecified, not intractable, without status epilepticus: Secondary | ICD-10-CM | POA: Diagnosis not present

## 2021-06-19 ENCOUNTER — Other Ambulatory Visit: Payer: Self-pay | Admitting: Rheumatology

## 2021-06-19 DIAGNOSIS — M3390 Dermatopolymyositis, unspecified, organ involvement unspecified: Secondary | ICD-10-CM

## 2021-06-20 ENCOUNTER — Telehealth: Payer: Self-pay | Admitting: Diagnostic Neuroimaging

## 2021-06-20 DIAGNOSIS — M6281 Muscle weakness (generalized): Secondary | ICD-10-CM

## 2021-06-20 NOTE — Telephone Encounter (Signed)
[name] [name] with [facility] called stating Dr. Lenna Gilford is wanting pt to have an EMG of the cervical spine weakness if there Is anything detected pt will be referred to neuro surgery. Pt requesting a call back [phone] ext [phone].

## 2021-06-23 NOTE — Telephone Encounter (Signed)
Jenny Reichmann called and asked about EMG/NCS Dr Lenna Gilford has requested, I advised her that Dr Leta Baptist has ordered tests and team that schedules those has been notified. Cindy  verbalized understanding, appreciation, will inform Dr Lenna Gilford.

## 2021-06-25 LAB — CUP PACEART REMOTE DEVICE CHECK
Date Time Interrogation Session: 20221204230903
Implantable Pulse Generator Implant Date: 20210203

## 2021-06-30 ENCOUNTER — Ambulatory Visit (INDEPENDENT_AMBULATORY_CARE_PROVIDER_SITE_OTHER): Payer: Medicare HMO

## 2021-06-30 DIAGNOSIS — I6389 Other cerebral infarction: Secondary | ICD-10-CM

## 2021-06-30 DIAGNOSIS — M3390 Dermatopolymyositis, unspecified, organ involvement unspecified: Secondary | ICD-10-CM | POA: Diagnosis not present

## 2021-06-30 DIAGNOSIS — M160 Bilateral primary osteoarthritis of hip: Secondary | ICD-10-CM | POA: Diagnosis not present

## 2021-07-08 DIAGNOSIS — M3390 Dermatopolymyositis, unspecified, organ involvement unspecified: Secondary | ICD-10-CM | POA: Diagnosis not present

## 2021-07-08 DIAGNOSIS — R21 Rash and other nonspecific skin eruption: Secondary | ICD-10-CM | POA: Diagnosis not present

## 2021-07-08 DIAGNOSIS — M6281 Muscle weakness (generalized): Secondary | ICD-10-CM | POA: Diagnosis not present

## 2021-07-08 DIAGNOSIS — M4802 Spinal stenosis, cervical region: Secondary | ICD-10-CM | POA: Diagnosis not present

## 2021-07-08 DIAGNOSIS — Z6820 Body mass index (BMI) 20.0-20.9, adult: Secondary | ICD-10-CM | POA: Diagnosis not present

## 2021-07-09 NOTE — Progress Notes (Signed)
Carelink Summary Report / Loop Recorder 

## 2021-07-10 DIAGNOSIS — R29898 Other symptoms and signs involving the musculoskeletal system: Secondary | ICD-10-CM | POA: Diagnosis not present

## 2021-07-10 DIAGNOSIS — M5412 Radiculopathy, cervical region: Secondary | ICD-10-CM | POA: Diagnosis not present

## 2021-07-10 DIAGNOSIS — R200 Anesthesia of skin: Secondary | ICD-10-CM | POA: Diagnosis not present

## 2021-07-10 DIAGNOSIS — G5603 Carpal tunnel syndrome, bilateral upper limbs: Secondary | ICD-10-CM | POA: Diagnosis not present

## 2021-07-15 DIAGNOSIS — E119 Type 2 diabetes mellitus without complications: Secondary | ICD-10-CM | POA: Diagnosis not present

## 2021-07-15 DIAGNOSIS — Z794 Long term (current) use of insulin: Secondary | ICD-10-CM | POA: Diagnosis not present

## 2021-07-15 DIAGNOSIS — E1140 Type 2 diabetes mellitus with diabetic neuropathy, unspecified: Secondary | ICD-10-CM | POA: Diagnosis not present

## 2021-07-15 DIAGNOSIS — E1165 Type 2 diabetes mellitus with hyperglycemia: Secondary | ICD-10-CM | POA: Diagnosis not present

## 2021-07-17 DIAGNOSIS — E1151 Type 2 diabetes mellitus with diabetic peripheral angiopathy without gangrene: Secondary | ICD-10-CM | POA: Diagnosis not present

## 2021-07-17 DIAGNOSIS — I739 Peripheral vascular disease, unspecified: Secondary | ICD-10-CM | POA: Diagnosis not present

## 2021-07-28 DIAGNOSIS — N289 Disorder of kidney and ureter, unspecified: Secondary | ICD-10-CM | POA: Diagnosis not present

## 2021-07-28 DIAGNOSIS — N281 Cyst of kidney, acquired: Secondary | ICD-10-CM | POA: Diagnosis not present

## 2021-07-28 DIAGNOSIS — K862 Cyst of pancreas: Secondary | ICD-10-CM | POA: Diagnosis not present

## 2021-07-28 DIAGNOSIS — R1013 Epigastric pain: Secondary | ICD-10-CM | POA: Diagnosis not present

## 2021-08-04 ENCOUNTER — Ambulatory Visit (INDEPENDENT_AMBULATORY_CARE_PROVIDER_SITE_OTHER): Payer: Medicare HMO

## 2021-08-04 DIAGNOSIS — I6389 Other cerebral infarction: Secondary | ICD-10-CM | POA: Diagnosis not present

## 2021-08-04 LAB — CUP PACEART REMOTE DEVICE CHECK
Date Time Interrogation Session: 20230115230219
Implantable Pulse Generator Implant Date: 20210203

## 2021-08-06 ENCOUNTER — Emergency Department (HOSPITAL_COMMUNITY)
Admission: EM | Admit: 2021-08-06 | Discharge: 2021-08-06 | Disposition: A | Discharge status: Home / Self Care | Payer: Medicare HMO | Attending: Emergency Medicine | Admitting: Emergency Medicine

## 2021-08-06 ENCOUNTER — Emergency Department (HOSPITAL_COMMUNITY): Payer: Medicare HMO

## 2021-08-06 ENCOUNTER — Encounter (HOSPITAL_COMMUNITY): Payer: Self-pay | Admitting: Oncology

## 2021-08-06 ENCOUNTER — Other Ambulatory Visit: Payer: Self-pay

## 2021-08-06 DIAGNOSIS — M25561 Pain in right knee: Secondary | ICD-10-CM | POA: Insufficient documentation

## 2021-08-06 DIAGNOSIS — Z743 Need for continuous supervision: Secondary | ICD-10-CM | POA: Diagnosis not present

## 2021-08-06 DIAGNOSIS — M25562 Pain in left knee: Secondary | ICD-10-CM | POA: Insufficient documentation

## 2021-08-06 DIAGNOSIS — R7309 Other abnormal glucose: Secondary | ICD-10-CM | POA: Insufficient documentation

## 2021-08-06 DIAGNOSIS — M7989 Other specified soft tissue disorders: Secondary | ICD-10-CM | POA: Diagnosis not present

## 2021-08-06 DIAGNOSIS — M2578 Osteophyte, vertebrae: Secondary | ICD-10-CM | POA: Diagnosis not present

## 2021-08-06 DIAGNOSIS — S0990XA Unspecified injury of head, initial encounter: Secondary | ICD-10-CM | POA: Diagnosis not present

## 2021-08-06 DIAGNOSIS — W19XXXA Unspecified fall, initial encounter: Secondary | ICD-10-CM

## 2021-08-06 DIAGNOSIS — R739 Hyperglycemia, unspecified: Secondary | ICD-10-CM | POA: Diagnosis not present

## 2021-08-06 DIAGNOSIS — R262 Difficulty in walking, not elsewhere classified: Secondary | ICD-10-CM | POA: Diagnosis not present

## 2021-08-06 DIAGNOSIS — Y92002 Bathroom of unspecified non-institutional (private) residence single-family (private) house as the place of occurrence of the external cause: Secondary | ICD-10-CM | POA: Diagnosis not present

## 2021-08-06 DIAGNOSIS — W16212A Fall in (into) filled bathtub causing other injury, initial encounter: Secondary | ICD-10-CM | POA: Diagnosis not present

## 2021-08-06 DIAGNOSIS — R519 Headache, unspecified: Secondary | ICD-10-CM | POA: Insufficient documentation

## 2021-08-06 DIAGNOSIS — I959 Hypotension, unspecified: Secondary | ICD-10-CM | POA: Diagnosis not present

## 2021-08-06 DIAGNOSIS — R600 Localized edema: Secondary | ICD-10-CM | POA: Diagnosis not present

## 2021-08-06 LAB — CBG MONITORING, ED
Glucose-Capillary: 199 mg/dL — ABNORMAL HIGH (ref 70–99)
Glucose-Capillary: 158 mg/dL — ABNORMAL HIGH (ref 70–99)

## 2021-08-06 MED ORDER — ACETAMINOPHEN 325 MG PO TABS
650.0000 mg | ORAL_TABLET | Freq: Once | ORAL | Status: AC
Start: 2021-08-06 — End: 2021-08-06
  Administered 2021-08-06: 650 mg via ORAL
  Filled 2021-08-06: qty 2

## 2021-08-06 MED ORDER — HYDROCODONE-ACETAMINOPHEN 5-325 MG PO TABS
1.0000 | ORAL_TABLET | Freq: Four times a day (QID) | ORAL | 0 refills | Status: DC | PRN
Start: 2021-08-06 — End: 2021-09-16

## 2021-08-06 MED ORDER — IBUPROFEN 200 MG PO TABS
400.0000 mg | ORAL_TABLET | Freq: Once | ORAL | Status: AC
  Administered 2021-08-06: 400 mg via ORAL
  Filled 2021-08-06: qty 2

## 2021-08-06 MED ORDER — HYDROCODONE-ACETAMINOPHEN 5-325 MG PO TABS
1.0000 | ORAL_TABLET | Freq: Once | ORAL | Status: DC
  Filled 2021-08-06: qty 1

## 2021-08-06 NOTE — Discharge Instructions (Addendum)
Return for any problem.  ?

## 2021-08-06 NOTE — ED Notes (Signed)
Patient was able to ambulate with the assistance of a walker to and from the bathroom. Patient only used standby assistance.

## 2021-08-06 NOTE — ED Triage Notes (Signed)
Pt bib [facility] from home s/p fall in bathtub.  Pt denies pain to neck and back.  Pt has swelling to b/l knees scheduled to be drained tomorrow.

## 2021-08-06 NOTE — ED Provider Triage Note (Signed)
Emergency Medicine Provider Triage Evaluation Note  [name] [name] , a 88 y.o. female  was evaluated in triage.  Pt complains of a fall.  Patient reports she slipped trying to get out of the tub did not completely fall but was caught over the commode and could not get up.  Complaining of pain in both of her knees, which is chronic she has fluid buildup on her knees.  Also has some discomfort over her neck which she reports is just worse than usual.  Does not think she hit her head but does have a scratch over her face.  Review of Systems  Positive: Knee pain, neck pain Negative: Headache, loss of consciousness, vomiting, chest pain, shortness of breath, syncope  Physical Exam  BP (!) 151/58 (BP Location: Left Arm)    Pulse 60    Temp 98.5 F (36.9 C) (Oral)    Resp 14    SpO2 98%  Gen:   Awake, no distress   Resp:  Normal effort  MSK:   Moves extremities without difficulty, tenderness over bilateral knees without palpable deformity there is also some midline C-spine tenderness without deformity Other:    Medical Decision Making  Medically screening exam initiated at 3:34 PM.  Appropriate orders placed.  [name] [name] was informed that the remainder of the evaluation will be completed by another provider, this initial triage assessment does not replace that evaluation, and the importance of remaining in the ED until their evaluation is complete.     Jacqlyn Larsen, [place] 08/06/21 1546

## 2021-08-06 NOTE — ED Provider Notes (Signed)
[facility] DEPT Provider Note   CSN: [id number] Arrival date & time: 08/06/21  1517     History  Chief Complaint  Patient presents with   [name] [name] is a 88 y.o. female.  88 year old female with prior medical history as detailed below presents with her family.  She reports a fall this evening while at home.  She fell while she was exiting her tub.  She hit both knees as she fell.  The patient complains of increased pain to both knees after the fall.  She walks with a walker at baseline.  She lives independently.  The family reports that they check on her regularly every day.  Patient denies head injury.  She denies LOC.  She denies chest pain, shortness of breath, back pain, hip pain, or other complaint.  She did not take anything for her painful knees after the fall.  She was able to stand and walk with some assistance with her walker after the fall.  The history is provided by the patient, a relative and medical records.  Fall This is a new problem. The current episode started 6 to 12 hours ago. The problem occurs rarely. The problem has not changed since onset.Pertinent negatives include no chest pain and no abdominal pain. Nothing aggravates the symptoms. Nothing relieves the symptoms.      Home Medications Prior to Admission medications   Medication Sig Start Date End Date Taking? Authorizing Provider  acetaminophen (TYLENOL) 500 MG tablet Take 500-1,000 mg by mouth at bedtime as needed for mild pain.    Yes [provider]  amLODipine (NORVASC) 2.5 MG tablet Take 2.5 mg by mouth at bedtime. 05/16/20  Yes [provider]  aspirin EC 81 MG tablet Take 81 mg by mouth daily.   Yes [provider]  atorvastatin (LIPITOR) 40 MG tablet Take 40 mg by mouth at bedtime. 02/16/19  Yes [provider]  azelastine (ASTELIN) 0.1 % nasal spray Place 2 sprays into both nostrils 2 (two) times daily. Use in each  nostril as directed Patient taking differently: Place 2 sprays into both nostrils 2 (two) times daily as needed for allergies or rhinitis (when not using Nasonex). 03/15/20  Yes Ambs, Kathrine Cords, FNP  cetirizine (ZYRTEC) 10 MG tablet Take 10 mg by mouth daily as needed for allergies.   Yes [provider]  Cholecalciferol (VITAMIN D-3 PO) Take 1 capsule by mouth in the morning.   Yes [provider]  famotidine (PEPCID) 20 MG tablet Take 20 mg by mouth at bedtime as needed for heartburn or indigestion. 04/05/20  Yes [provider]  HYDROcodone-acetaminophen (NORCO/VICODIN) 5-325 MG tablet Take 1 tablet by mouth every 6 (six) hours as needed. 08/06/21  Yes Denecia Brunette, Wallis Bamberg, MD  insulin aspart (NOVOLOG FLEXPEN) 100 UNIT/ML FlexPen 2-10 Units See admin instructions. Inject into the skin three times a day before meals, PER SLIDING SCALE: 8-10 units before breakfast, 5-7 units before lunch, and 2-4 units before supper   Yes [provider]  insulin detemir (LEVEMIR FLEXTOUCH) 100 UNIT/ML FlexPen 3-7 Units See admin instructions. Inject 7 units into the skin in the morning and 3 units at bedtime   Yes [provider]  Iron-FA-B Cmp-C-Biot-Probiotic (FUSION PLUS) CAPS Take 1 capsule by mouth daily with breakfast. 03/02/19  Yes [provider]  levETIRAcetam (KEPPRA) 500 MG tablet Take 1 tablet (500 mg total) by mouth 2 (two) times daily. Patient taking differently: Take  500 mg by mouth in the morning and at bedtime. 10/11/19  Yes Donzetta Starch, NP  losartan (COZAAR) 50 MG tablet Take 50 mg by mouth daily.  01/24/19  Yes [provider]  mometasone (NASONEX) 50 MCG/ACT nasal spray Place 2 sprays into the nose daily as needed (for allergies or rhinitis- when not using Astelin).   Yes [provider]  Multiple Vitamin (MULTIVITAMIN WITH MINERALS) TABS tablet Take 1 tablet by mouth daily.   Yes [provider]  nystatin (MYCOSTATIN) 100000  UNIT/ML suspension Take 4 mLs by mouth at bedtime as needed (as directed).   Yes [provider]  Omega-3 Fatty Acids (FISH OIL) 1000 MG CAPS Take 1,000 mg by mouth 2 (two) times daily.   Yes [provider]  predniSONE (DELTASONE) 20 MG tablet Take 20-30 mg by mouth See admin instructions. Take 30 mg by mouth in the morning and 20 mg at bedtime until re-check with MD A. Hawkes on 08/12/2021   Yes [provider]  SYNTHROID 200 MCG tablet Take 200 mcg by mouth daily before breakfast. 04/23/21  Yes [provider]  triamcinolone cream (KENALOG) 0.1 % Apply 1 application topically 2 (two) times daily as needed (to affected areas- as directed).   Yes [provider]  B-D UF III MINI PEN NEEDLES 31G X 5 MM MISC USE AS DIRECTED TID 02/14/19   [provider]  montelukast (SINGULAIR) 10 MG tablet Take 1 tablet (10 mg total) by mouth at bedtime. Patient not taking: Reported on 06/03/2021 10/17/20   Kennith Gain, MD  OneTouch Delica Lancets 89Q MISC IC-10 CODE:  E11.65  Check blood sugar    [provider]  [facility] VERIO test strip 1 each 3 (three) times daily. 07/07/20   [provider]  terconazole (TERAZOL 3) 0.8 % vaginal cream Place 1 applicator vaginally at bedtime. Patient not taking: Reported on 08/06/2021 03/14/21   Princess Bruins, MD      Allergies    Sulfonamide derivatives, Biaxin [clarithromycin], Sulfa antibiotics, Bactrim [sulfamethoxazole-trimethoprim], and Lisinopril    Review of Systems   Review of Systems  Cardiovascular:  Negative for chest pain.  Gastrointestinal:  Negative for abdominal pain.  All other systems reviewed and are negative.  Physical Exam Updated Vital Signs BP (!) 175/78    Pulse (!) 58    Temp 98.5 F (36.9 C) (Oral)    Resp 16    Ht 5\' 6"  (1.676 m)    Wt 56.7 kg    SpO2 99%    BMI 20.18 kg/m  Physical Exam Vitals and nursing note reviewed.  Constitutional:      General:  She is not in acute distress.    Appearance: Normal appearance. She is well-developed.  HENT:     Head: Normocephalic and atraumatic.  Eyes:     Conjunctiva/sclera: Conjunctivae normal.     Pupils: Pupils are equal, round, and reactive to light.  Cardiovascular:     Rate and Rhythm: Normal rate and regular rhythm.     Heart sounds: Normal heart sounds.  Pulmonary:     Effort: Pulmonary effort is normal. No respiratory distress.     Breath sounds: Normal breath sounds.  Abdominal:     General: There is no distension.     Palpations: Abdomen is soft.     Tenderness: There is no abdominal tenderness.  Musculoskeletal:        General: No deformity. Normal range of motion.  Cervical back: Normal range of motion and neck supple.     Comments: Mild tenderness with palpation to the anterior aspect of both knees.  Bilateral knees are with full active range of motion.  Distal bilateral lower extremities are neurovascular intact.  Bilateral knees are without instability or break in the skin.  Skin:    General: Skin is warm and dry.  Neurological:     General: No focal deficit present.     Mental Status: She is alert and oriented to person, place, and time.    ED Results / Procedures / Treatments   Labs (all labs ordered are listed, but only abnormal results are displayed) Labs Reviewed  CBG MONITORING, ED - Abnormal; Notable for the following components:      Result Value   Glucose-Capillary 199 (*)    All other components within normal limits  CBG MONITORING, ED - Abnormal; Notable for the following components:   Glucose-Capillary 158 (*)    All other components within normal limits    EKG None  Radiology CT Head Wo Contrast  Result Date: 08/06/2021 CLINICAL DATA:  Head trauma, minor (Age >= 65y) EXAM: CT HEAD WITHOUT CONTRAST TECHNIQUE: Contiguous axial images were obtained from the base of the skull through the vertex without intravenous contrast. RADIATION DOSE REDUCTION:  This exam was performed according to the departmental dose-optimization program which includes automated exposure control, adjustment of the mA and/or kV according to patient size and/or use of iterative reconstruction technique. COMPARISON:  10/08/2019 FINDINGS: Brain: There is no acute intracranial hemorrhage, mass effect, or edema. Gray-white differentiation is preserved. There is no extra-axial fluid collection. Patchy and confluent hypoattenuation in the supratentorial white matter probably reflects similar chronic microvascular ischemic changes. New age-indeterminate small vessel infarcts of the right thalamus and posterior internal capsule. Prominence of the ventricles and sulci reflects similar parenchymal volume loss. Vascular: There is atherosclerotic calcification at the skull base. Skull: Calvarium is unremarkable. Sinuses/Orbits: No acute finding. Other: None. IMPRESSION: No evidence of acute intracranial injury. Similar chronic microvascular ischemic changes. New age-indeterminate small vessel infarcts of the right thalamus and posterior internal capsule are probably chronic. Electronically Signed   By: Macy Mis M.D.   On: 08/06/2021 16:41   CT Cervical Spine Wo Contrast  Result Date: 08/06/2021 CLINICAL DATA:  Neck trauma (Age >= 65y) EXAM: CT CERVICAL SPINE WITHOUT CONTRAST TECHNIQUE: Multidetector CT imaging of the cervical spine was performed without intravenous contrast. Multiplanar CT image reconstructions were also generated. RADIATION DOSE REDUCTION: This exam was performed according to the departmental dose-optimization program which includes automated exposure control, adjustment of the mA and/or kV according to patient size and/or use of iterative reconstruction technique. COMPARISON:  None. FINDINGS: Alignment: Mild degenerative listhesis. Skull base and vertebrae: Degenerative endplate irregularity. Vertebral body heights are otherwise maintained. No acute fracture. Soft tissues  and spinal canal: No prevertebral fluid or swelling. No visible canal hematoma. Disc levels: Multilevel degenerative changes are present including disc space narrowing, endplate osteophytes, and facet and uncovertebral hypertrophy. Multilevel canal and foraminal stenosis. Upper chest: No apical lung mass. Other: Mild calcified plaque at the common carotid bifurcations. IMPRESSION: No acute cervical spine fracture. Electronically Signed   By: Macy Mis M.D.   On: 08/06/2021 16:46   DG Knee Complete 4 Views Left  Result Date: 08/06/2021 CLINICAL DATA:  Left knee swelling EXAM: LEFT KNEE - COMPLETE 4+ VIEW COMPARISON:  None. FINDINGS: Frontal, bilateral oblique, lateral views of the left knee are obtained. The bones  are osteopenic. No fracture, subluxation, or dislocation. Moderate 3 compartmental osteoarthritis. Diffuse subcutaneous edema. No evidence of effusion. Diffuse atherosclerosis. IMPRESSION: 1. Moderate 3 compartmental osteoarthritis. 2. No acute fracture. 3. Osteopenia. 4. Diffuse soft tissue edema.  No joint effusion. Electronically Signed   By: Randa Ngo M.D.   On: 08/06/2021 16:30   DG Knee Complete 4 Views Right  Result Date: 08/06/2021 CLINICAL DATA:  Fall bilateral knee pain. EXAM: RIGHT KNEE - COMPLETE 4+ VIEW COMPARISON:  None. FINDINGS: No evidence of fracture, dislocation, or joint effusion. Tricompartmental joint space narrowing with marginal osteophytes prominent in the medial tibiofemoral and patellofemoral compartments. Soft tissues are unremarkable. IMPRESSION: 1.  No acute fracture or dislocation. 2.  Mild knee osteoarthritis. Electronically Signed   By: Keane Police D.O.   On: 08/06/2021 16:30    Procedures Procedures    Medications Ordered in ED Medications  acetaminophen (TYLENOL) tablet 650 mg (650 mg Oral Given 08/06/21 1844)  ibuprofen (ADVIL) tablet 400 mg (400 mg Oral Given 08/06/21 1844)    ED Course/ Medical Decision Making/ A&P                            Medical Decision Making Risk OTC drugs. Prescription drug management.    Medical Screen Complete  This patient presented to the ED with complaint of fall.  This complaint involves an extensive number of treatment options. The initial differential diagnosis includes, but is not limited to, traumatic injury related to fall, etc.  This presentation is: Acute, Self-Limited, Previously Undiagnosed, Uncertain Prognosis, Complicated, Systemic Symptoms, and Threat to Life/Bodily Function  Patient presented after fall at home  Patient without evidence of significant traumatic injury on examination.  Imaging obtained is without evidence of significant traumatic injury.  Patient's pain is controlled here in the ED.  She is able to ambulate with her walker with supervision.  Family understands that she will need to be closely monitored for the next 24 to 48 hours.  Patient understands need for close follow-up.  Strict return cautions given and understood.  Co morbidities that complicated the patient's evaluation  Advanced age   Additional history obtained:  Additional history obtained from [facility] External records from outside sources obtained and reviewed including prior ED visits and prior Inpatient records.    Imaging Studies ordered:  I ordered imaging studies including CT head and CT cspine and plain films of the knees I independently visualized and interpreted obtained imaging which showed NAD I agree with the radiologist interpretation.   Medicines ordered:  I ordered medication including acetaminophen and ibuprofen  for pain  Reevaluation of the patient after these medicines showed that the patient: improved     Problem List / ED Course:  Fall, knee contusion   Reevaluation:  After the interventions noted above, I reevaluated the patient and found that they have: improved   Dispostion:  After consideration of the diagnostic results and the patients  response to treatment, I feel that the patent would benefit from close outpatient followup.          Final Clinical Impression(s) / ED Diagnoses Final diagnoses:  Fall, initial encounter    Rx / DC Orders ED Discharge Orders          Ordered    HYDROcodone-acetaminophen (NORCO/VICODIN) 5-325 MG tablet  Every 6 hours PRN        08/06/21 2023    Ambulatory referral to [facility]  Comments: Please evaluate [name] [name] for admission to [facility].  Disciplines requested: Nursing, Physical Therapy, and [place] to provide: Evaluate  Physician to follow patient's care (the person listed here will be responsible for signing ongoing orders): PCP  Requested Start of Care Date: Within 2-3 days  I certify that this patient is under my care and that I, or a Nurse Practitioner or Physician's Assistant working with me, had a face-to-face encounter that meets the physician face-to-face requirements with patient on 08/06/2021. The encounter with the patient was in whole, or in part for the following medical condition(s) which is the primary reason for home health care (List medical condition). Unsteady Gait  Special Instructions:   08/06/21 2028              Valarie Merino, MD 08/06/21 2036

## 2021-08-07 DIAGNOSIS — M170 Bilateral primary osteoarthritis of knee: Secondary | ICD-10-CM | POA: Diagnosis not present

## 2021-08-08 ENCOUNTER — Ambulatory Visit: Payer: Medicare HMO | Admitting: Neurology

## 2021-08-12 DIAGNOSIS — M3390 Dermatopolymyositis, unspecified, organ involvement unspecified: Secondary | ICD-10-CM | POA: Diagnosis not present

## 2021-08-12 DIAGNOSIS — R21 Rash and other nonspecific skin eruption: Secondary | ICD-10-CM | POA: Diagnosis not present

## 2021-08-12 DIAGNOSIS — M4802 Spinal stenosis, cervical region: Secondary | ICD-10-CM | POA: Diagnosis not present

## 2021-08-12 DIAGNOSIS — M6281 Muscle weakness (generalized): Secondary | ICD-10-CM | POA: Diagnosis not present

## 2021-08-12 DIAGNOSIS — Z6820 Body mass index (BMI) 20.0-20.9, adult: Secondary | ICD-10-CM | POA: Diagnosis not present

## 2021-08-12 DIAGNOSIS — M170 Bilateral primary osteoarthritis of knee: Secondary | ICD-10-CM | POA: Diagnosis not present

## 2021-08-12 DIAGNOSIS — M159 Polyosteoarthritis, unspecified: Secondary | ICD-10-CM | POA: Diagnosis not present

## 2021-08-14 ENCOUNTER — Other Ambulatory Visit: Payer: Self-pay | Admitting: Rheumatology

## 2021-08-14 ENCOUNTER — Ambulatory Visit
Admission: RE | Admit: 2021-08-14 | Discharge: 2021-08-14 | Disposition: A | Discharge status: Home / Self Care | Payer: Medicare HMO | Source: Ambulatory Visit | Attending: Physician Assistant | Admitting: Physician Assistant

## 2021-08-14 ENCOUNTER — Other Ambulatory Visit: Payer: Self-pay | Admitting: Physician Assistant

## 2021-08-14 DIAGNOSIS — R051 Acute cough: Secondary | ICD-10-CM

## 2021-08-14 DIAGNOSIS — Z993 Dependence on wheelchair: Secondary | ICD-10-CM | POA: Diagnosis not present

## 2021-08-14 DIAGNOSIS — J439 Emphysema, unspecified: Secondary | ICD-10-CM | POA: Diagnosis not present

## 2021-08-14 DIAGNOSIS — R7989 Other specified abnormal findings of blood chemistry: Secondary | ICD-10-CM

## 2021-08-14 DIAGNOSIS — S31819A Unspecified open wound of right buttock, initial encounter: Secondary | ICD-10-CM | POA: Diagnosis not present

## 2021-08-14 DIAGNOSIS — R54 Age-related physical debility: Secondary | ICD-10-CM | POA: Diagnosis not present

## 2021-08-14 DIAGNOSIS — R238 Other skin changes: Secondary | ICD-10-CM | POA: Diagnosis not present

## 2021-08-14 NOTE — Progress Notes (Signed)
Carelink Summary Report / Loop Recorder 

## 2021-08-21 ENCOUNTER — Encounter: Payer: Medicare HMO | Admitting: Diagnostic Neuroimaging

## 2021-08-22 ENCOUNTER — Other Ambulatory Visit (HOSPITAL_COMMUNITY): Payer: Self-pay

## 2021-08-22 DIAGNOSIS — R1310 Dysphagia, unspecified: Secondary | ICD-10-CM

## 2021-08-22 DIAGNOSIS — Z993 Dependence on wheelchair: Secondary | ICD-10-CM | POA: Diagnosis not present

## 2021-08-22 DIAGNOSIS — R54 Age-related physical debility: Secondary | ICD-10-CM | POA: Diagnosis not present

## 2021-08-27 ENCOUNTER — Other Ambulatory Visit: Payer: Self-pay

## 2021-08-27 ENCOUNTER — Ambulatory Visit (HOSPITAL_COMMUNITY)
Admission: RE | Admit: 2021-08-27 | Discharge: 2021-08-27 | Disposition: A | Discharge status: Home / Self Care | Payer: Medicare HMO | Source: Ambulatory Visit | Attending: Physician Assistant | Admitting: Physician Assistant

## 2021-08-27 ENCOUNTER — Ambulatory Visit
Admission: RE | Admit: 2021-08-27 | Discharge: 2021-08-27 | Disposition: A | Discharge status: Home / Self Care | Payer: Medicare HMO | Source: Ambulatory Visit | Attending: Rheumatology | Admitting: Rheumatology

## 2021-08-27 DIAGNOSIS — R1310 Dysphagia, unspecified: Secondary | ICD-10-CM | POA: Insufficient documentation

## 2021-08-27 DIAGNOSIS — S0550XA Penetrating wound with foreign body of unspecified eyeball, initial encounter: Secondary | ICD-10-CM | POA: Diagnosis not present

## 2021-08-27 DIAGNOSIS — R7989 Other specified abnormal findings of blood chemistry: Secondary | ICD-10-CM | POA: Diagnosis not present

## 2021-08-27 NOTE — Therapy (Signed)
Modified Barium Swallow Progress Note  Patient Details  Name: VENORA KAUTZMAN MRN: [id number] Date of Birth: 1934-07-13  Today's Date: 08/27/2021  Modified Barium Swallow completed.  Full report located under Chart Review in the Imaging Section.  Brief recommendations include the following:  Clinical Impression  Patient presents with a mild oral and a mild pharyngeal phase dysphagia as per this MBS. During oral phase, she exhibited delayed mastication and oral anterior to posterior transit of puree solids and regular solids. During pharyngeal phase, she exhibited swallow initiation delays to level of vallecular sinus and at times pyriform sinus with thin liquids. Trace penetration which did not fully clear occurred with thin liquid sips via cup or via straw sip however no aspiration was observed. She exhibited trace vallecular sinus residuals with thin liquids, moderate vallecular residuals with puree and regular texture solids. Vallecular residuals would slowly clear with subsequent swallows but full clearance not observed. Presence of suspected cervical osteophytes observed but did not appear to impede bolus transit. In addition, mild retrograde movement of puree solid bolus observed however bolus stayed below UES. SLP discussed results with patient and daughter with recommendations to watch for patient tilting head back when drinking, focusing on foods that have some moisture and avoiding hard, tough, dense foods.   Swallow Evaluation Recommendations       SLP Diet Recommendations: Dysphagia 3 (Mech soft) solids;Thin liquid   Liquid Administration via: Cup;Straw   Medication Administration: Whole meds with liquid       Compensations: Follow solids with liquid;Small sips/bites;Slow rate   Postural Changes: Remain semi-upright after after feeds/meals (Comment);Seated upright at 90 degrees            Sonia Baller, MA, CCC-SLP Speech Therapy

## 2021-08-28 ENCOUNTER — Ambulatory Visit (HOSPITAL_COMMUNITY): Payer: Medicare HMO

## 2021-08-28 ENCOUNTER — Encounter (HOSPITAL_COMMUNITY): Payer: Medicare HMO

## 2021-08-28 ENCOUNTER — Encounter (HOSPITAL_COMMUNITY): Payer: Self-pay

## 2021-09-07 LAB — CUP PACEART REMOTE DEVICE CHECK
Date Time Interrogation Session: 20230217230234
Implantable Pulse Generator Implant Date: 20210203

## 2021-09-08 ENCOUNTER — Ambulatory Visit (INDEPENDENT_AMBULATORY_CARE_PROVIDER_SITE_OTHER): Payer: Medicare HMO

## 2021-09-08 DIAGNOSIS — I6389 Other cerebral infarction: Secondary | ICD-10-CM

## 2021-09-09 DIAGNOSIS — M3390 Dermatopolymyositis, unspecified, organ involvement unspecified: Secondary | ICD-10-CM | POA: Diagnosis not present

## 2021-09-12 NOTE — Progress Notes (Signed)
Carelink Summary Report / Loop Recorder 

## 2021-09-13 ENCOUNTER — Other Ambulatory Visit: Payer: Self-pay

## 2021-09-13 ENCOUNTER — Emergency Department (HOSPITAL_COMMUNITY): Payer: Medicare HMO

## 2021-09-13 ENCOUNTER — Encounter (HOSPITAL_COMMUNITY): Payer: Self-pay | Admitting: Radiology

## 2021-09-13 ENCOUNTER — Inpatient Hospital Stay (HOSPITAL_COMMUNITY)
Admission: EM | Admit: 2021-09-13 | Discharge: 2021-09-16 | DRG: 640 | Disposition: A | Discharge status: Home Health | Payer: Medicare HMO | Attending: Internal Medicine | Admitting: Internal Medicine

## 2021-09-13 DIAGNOSIS — E11649 Type 2 diabetes mellitus with hypoglycemia without coma: Secondary | ICD-10-CM

## 2021-09-13 DIAGNOSIS — Z79899 Other long term (current) drug therapy: Secondary | ICD-10-CM

## 2021-09-13 DIAGNOSIS — Z90710 Acquired absence of both cervix and uterus: Secondary | ICD-10-CM

## 2021-09-13 DIAGNOSIS — N182 Chronic kidney disease, stage 2 (mild): Secondary | ICD-10-CM | POA: Diagnosis present

## 2021-09-13 DIAGNOSIS — Z681 Body mass index (BMI) 19 or less, adult: Secondary | ICD-10-CM

## 2021-09-13 DIAGNOSIS — R4020 Unspecified coma: Secondary | ICD-10-CM | POA: Diagnosis not present

## 2021-09-13 DIAGNOSIS — R4182 Altered mental status, unspecified: Secondary | ICD-10-CM | POA: Diagnosis not present

## 2021-09-13 DIAGNOSIS — Z66 Do not resuscitate: Secondary | ICD-10-CM | POA: Diagnosis present

## 2021-09-13 DIAGNOSIS — Z794 Long term (current) use of insulin: Secondary | ICD-10-CM

## 2021-09-13 DIAGNOSIS — K5900 Constipation, unspecified: Secondary | ICD-10-CM | POA: Diagnosis present

## 2021-09-13 DIAGNOSIS — E1151 Type 2 diabetes mellitus with diabetic peripheral angiopathy without gangrene: Secondary | ICD-10-CM | POA: Diagnosis present

## 2021-09-13 DIAGNOSIS — B379 Candidiasis, unspecified: Secondary | ICD-10-CM | POA: Diagnosis present

## 2021-09-13 DIAGNOSIS — K219 Gastro-esophageal reflux disease without esophagitis: Secondary | ICD-10-CM | POA: Diagnosis present

## 2021-09-13 DIAGNOSIS — Z7982 Long term (current) use of aspirin: Secondary | ICD-10-CM

## 2021-09-13 DIAGNOSIS — Z882 Allergy status to sulfonamides status: Secondary | ICD-10-CM

## 2021-09-13 DIAGNOSIS — U071 COVID-19: Secondary | ICD-10-CM | POA: Diagnosis not present

## 2021-09-13 DIAGNOSIS — M3313 Other dermatomyositis without myopathy: Secondary | ICD-10-CM | POA: Diagnosis present

## 2021-09-13 DIAGNOSIS — R600 Localized edema: Secondary | ICD-10-CM | POA: Diagnosis present

## 2021-09-13 DIAGNOSIS — Z881 Allergy status to other antibiotic agents status: Secondary | ICD-10-CM

## 2021-09-13 DIAGNOSIS — E1122 Type 2 diabetes mellitus with diabetic chronic kidney disease: Secondary | ICD-10-CM | POA: Diagnosis present

## 2021-09-13 DIAGNOSIS — E871 Hypo-osmolality and hyponatremia: Secondary | ICD-10-CM | POA: Diagnosis present

## 2021-09-13 DIAGNOSIS — E785 Hyperlipidemia, unspecified: Secondary | ICD-10-CM | POA: Diagnosis present

## 2021-09-13 DIAGNOSIS — R55 Syncope and collapse: Secondary | ICD-10-CM | POA: Diagnosis present

## 2021-09-13 DIAGNOSIS — M3390 Dermatopolymyositis, unspecified, organ involvement unspecified: Secondary | ICD-10-CM | POA: Diagnosis present

## 2021-09-13 DIAGNOSIS — Z888 Allergy status to other drugs, medicaments and biological substances status: Secondary | ICD-10-CM

## 2021-09-13 DIAGNOSIS — E7800 Pure hypercholesterolemia, unspecified: Secondary | ICD-10-CM | POA: Diagnosis present

## 2021-09-13 DIAGNOSIS — L98498 Non-pressure chronic ulcer of skin of other sites with other specified severity: Secondary | ICD-10-CM | POA: Diagnosis present

## 2021-09-13 DIAGNOSIS — B370 Candidal stomatitis: Secondary | ICD-10-CM | POA: Diagnosis present

## 2021-09-13 DIAGNOSIS — Z79890 Hormone replacement therapy (postmenopausal): Secondary | ICD-10-CM

## 2021-09-13 DIAGNOSIS — R000 Tachycardia, unspecified: Secondary | ICD-10-CM | POA: Diagnosis not present

## 2021-09-13 DIAGNOSIS — N179 Acute kidney failure, unspecified: Secondary | ICD-10-CM | POA: Diagnosis present

## 2021-09-13 DIAGNOSIS — I739 Peripheral vascular disease, unspecified: Secondary | ICD-10-CM | POA: Diagnosis present

## 2021-09-13 DIAGNOSIS — E1165 Type 2 diabetes mellitus with hyperglycemia: Secondary | ICD-10-CM | POA: Diagnosis not present

## 2021-09-13 DIAGNOSIS — Z743 Need for continuous supervision: Secondary | ICD-10-CM | POA: Diagnosis not present

## 2021-09-13 DIAGNOSIS — R630 Anorexia: Secondary | ICD-10-CM | POA: Diagnosis present

## 2021-09-13 DIAGNOSIS — L98429 Non-pressure chronic ulcer of back with unspecified severity: Secondary | ICD-10-CM | POA: Diagnosis present

## 2021-09-13 DIAGNOSIS — R1310 Dysphagia, unspecified: Secondary | ICD-10-CM | POA: Diagnosis present

## 2021-09-13 DIAGNOSIS — M810 Age-related osteoporosis without current pathological fracture: Secondary | ICD-10-CM | POA: Diagnosis present

## 2021-09-13 DIAGNOSIS — I129 Hypertensive chronic kidney disease with stage 1 through stage 4 chronic kidney disease, or unspecified chronic kidney disease: Secondary | ICD-10-CM | POA: Diagnosis present

## 2021-09-13 DIAGNOSIS — Z87891 Personal history of nicotine dependence: Secondary | ICD-10-CM

## 2021-09-13 DIAGNOSIS — Z8674 Personal history of sudden cardiac arrest: Secondary | ICD-10-CM | POA: Diagnosis not present

## 2021-09-13 DIAGNOSIS — Z8249 Family history of ischemic heart disease and other diseases of the circulatory system: Secondary | ICD-10-CM

## 2021-09-13 DIAGNOSIS — E039 Hypothyroidism, unspecified: Secondary | ICD-10-CM | POA: Diagnosis present

## 2021-09-13 DIAGNOSIS — E1159 Type 2 diabetes mellitus with other circulatory complications: Secondary | ICD-10-CM | POA: Diagnosis present

## 2021-09-13 DIAGNOSIS — I10 Essential (primary) hypertension: Secondary | ICD-10-CM | POA: Diagnosis present

## 2021-09-13 DIAGNOSIS — R350 Frequency of micturition: Secondary | ICD-10-CM | POA: Diagnosis present

## 2021-09-13 LAB — CBC WITH DIFFERENTIAL/PLATELET
Abs Immature Granulocytes: 0.26 10*3/uL — ABNORMAL HIGH (ref 0.00–0.07)
Basophils Absolute: 0.1 10*3/uL (ref 0.0–0.1)
Basophils Relative: 1 %
Eosinophils Absolute: 0 10*3/uL (ref 0.0–0.5)
Eosinophils Relative: 0 %
HCT: 39.1 % (ref 36.0–46.0)
Hemoglobin: 12.6 g/dL (ref 12.0–15.0)
Immature Granulocytes: 3 %
Lymphocytes Relative: 6 %
Lymphs Abs: 0.6 10*3/uL — ABNORMAL LOW (ref 0.7–4.0)
MCH: 34.6 pg — ABNORMAL HIGH (ref 26.0–34.0)
MCHC: 32.2 g/dL (ref 30.0–36.0)
MCV: 107.4 fL — ABNORMAL HIGH (ref 80.0–100.0)
Monocytes Absolute: 0.6 10*3/uL (ref 0.1–1.0)
Monocytes Relative: 5 %
Neutro Abs: 8.9 10*3/uL — ABNORMAL HIGH (ref 1.7–7.7)
Neutrophils Relative %: 85 %
Platelets: 270 10*3/uL (ref 150–400)
RBC: 3.64 MIL/uL — ABNORMAL LOW (ref 3.87–5.11)
RDW: 15.4 % (ref 11.5–15.5)
WBC: 10.4 10*3/uL (ref 4.0–10.5)
nRBC: 0 % (ref 0.0–0.2)

## 2021-09-13 LAB — CBG MONITORING, ED: Glucose-Capillary: 146 mg/dL — ABNORMAL HIGH (ref 70–99)

## 2021-09-13 LAB — BASIC METABOLIC PANEL
Anion gap: 8 (ref 5–15)
BUN: 20 mg/dL (ref 8–23)
CO2: 29 mmol/L (ref 22–32)
Calcium: 9.8 mg/dL (ref 8.9–10.3)
Chloride: 89 mmol/L — ABNORMAL LOW (ref 98–111)
Creatinine, Ser: 0.83 mg/dL (ref 0.44–1.00)
GFR, Estimated: >60 mL/min (ref >60)
Glucose, Bld: 138 mg/dL — ABNORMAL HIGH (ref 70–99)
Potassium: 4.8 mmol/L (ref 3.5–5.1)
Sodium: 126 mmol/L — ABNORMAL LOW (ref 135–145)

## 2021-09-13 LAB — COMPREHENSIVE METABOLIC PANEL
ALT: 44 U/L (ref 0–44)
AST: 33 U/L (ref 15–41)
Albumin: 3.4 g/dL — ABNORMAL LOW (ref 3.5–5.0)
Alkaline Phosphatase: 62 U/L (ref 38–126)
Anion gap: 6 (ref 5–15)
BUN: 23 mg/dL (ref 8–23)
CO2: 31 mmol/L (ref 22–32)
Calcium: 9.8 mg/dL (ref 8.9–10.3)
Chloride: 89 mmol/L — ABNORMAL LOW (ref 98–111)
Creatinine, Ser: 0.84 mg/dL (ref 0.44–1.00)
GFR, Estimated: >60 mL/min (ref >60)
Glucose, Bld: 67 mg/dL — ABNORMAL LOW (ref 70–99)
Potassium: 4.3 mmol/L (ref 3.5–5.1)
Sodium: 126 mmol/L — ABNORMAL LOW (ref 135–145)
Total Bilirubin: 0.4 mg/dL (ref 0.3–1.2)
Total Protein: 6.4 g/dL — ABNORMAL LOW (ref 6.5–8.1)

## 2021-09-13 LAB — NA AND K (SODIUM & POTASSIUM), RAND UR
Potassium Urine: 12 mmol/L
Sodium, Ur: 70 mmol/L

## 2021-09-13 LAB — URINALYSIS, ROUTINE W REFLEX MICROSCOPIC
Bacteria, UA: NONE SEEN
Bilirubin Urine: NEGATIVE
Glucose, UA: NEGATIVE mg/dL
Hgb urine dipstick: NEGATIVE
Ketones, ur: NEGATIVE mg/dL
Leukocytes,Ua: NEGATIVE
Nitrite: NEGATIVE
Protein, ur: 100 mg/dL — AB
Specific Gravity, Urine: 1.008 (ref 1.005–1.030)
pH: 7 (ref 5.0–8.0)

## 2021-09-13 LAB — RESP PANEL BY RT-PCR (FLU A&B, COVID) ARPGX2
Influenza A by PCR: NEGATIVE
Influenza B by PCR: NEGATIVE
SARS Coronavirus 2 by RT PCR: POSITIVE — AB

## 2021-09-13 LAB — CREATININE, URINE, RANDOM: Creatinine, Urine: 28.1 mg/dL

## 2021-09-13 LAB — TSH: TSH: 1.566 u[IU]/mL (ref 0.350–4.500)

## 2021-09-13 MED ORDER — FAMOTIDINE 20 MG PO TABS: 20.0000 mg | ORAL_TABLET | Freq: Every evening | ORAL | Status: DC | PRN

## 2021-09-13 MED ORDER — LEVOTHYROXINE SODIUM 100 MCG PO TABS
200.0000 ug | ORAL_TABLET | Freq: Every day | ORAL | Status: DC
  Administered 2021-09-14 – 2021-09-16 (×3): 200 ug via ORAL
  Filled 2021-09-13 (×3): qty 2

## 2021-09-13 MED ORDER — SODIUM CHLORIDE 0.9 % IV SOLN: INTRAVENOUS | Status: DC

## 2021-09-13 MED ORDER — POLYETHYLENE GLYCOL 3350 17 G PO PACK
17.0000 g | PACK | Freq: Every day | ORAL | Status: DC
  Administered 2021-09-13 – 2021-09-16 (×4): 17 g via ORAL
  Filled 2021-09-13 (×4): qty 1

## 2021-09-13 MED ORDER — AZELASTINE HCL 0.1 % NA SOLN: 2.0000 | Freq: Two times a day (BID) | NASAL | Status: DC

## 2021-09-13 MED ORDER — DOCUSATE SODIUM 100 MG PO CAPS
100.0000 mg | ORAL_CAPSULE | Freq: Two times a day (BID) | ORAL | Status: DC
  Administered 2021-09-13 – 2021-09-15 (×4): 100 mg via ORAL
  Filled 2021-09-13 (×4): qty 1

## 2021-09-13 MED ORDER — ACETAMINOPHEN 325 MG PO TABS: 650.0000 mg | ORAL_TABLET | Freq: Four times a day (QID) | ORAL | Status: DC | PRN

## 2021-09-13 MED ORDER — ENOXAPARIN SODIUM 40 MG/0.4ML IJ SOSY
40.0000 mg | PREFILLED_SYRINGE | INTRAMUSCULAR | Status: DC
  Administered 2021-09-13 – 2021-09-15 (×3): 40 mg via SUBCUTANEOUS
  Filled 2021-09-13 (×3): qty 0.4

## 2021-09-13 MED ORDER — NYSTATIN 100000 UNIT/ML MT SUSP
5.0000 mL | Freq: Four times a day (QID) | OROMUCOSAL | Status: DC
  Administered 2021-09-13 – 2021-09-16 (×10): 500000 [IU] via ORAL
  Filled 2021-09-13 (×10): qty 5

## 2021-09-13 MED ORDER — LEVETIRACETAM 500 MG PO TABS
500.0000 mg | ORAL_TABLET | Freq: Two times a day (BID) | ORAL | Status: DC
  Administered 2021-09-13 – 2021-09-16 (×6): 500 mg via ORAL
  Filled 2021-09-13 (×6): qty 1

## 2021-09-13 MED ORDER — LACTATED RINGERS IV BOLUS
500.0000 mL | Freq: Once | INTRAVENOUS | Status: AC
  Administered 2021-09-13: 500 mL via INTRAVENOUS

## 2021-09-13 MED ORDER — FLUTICASONE PROPIONATE 50 MCG/ACT NA SUSP
2.0000 | Freq: Every day | NASAL | Status: DC
Start: 2021-09-13 — End: 2021-09-13

## 2021-09-13 MED ORDER — INSULIN ASPART 100 UNIT/ML IJ SOLN
0.0000 [IU] | Freq: Three times a day (TID) | INTRAMUSCULAR | Status: DC
  Administered 2021-09-14: 2 [IU] via SUBCUTANEOUS
  Filled 2021-09-13: qty 0.06

## 2021-09-13 MED ORDER — MONTELUKAST SODIUM 10 MG PO TABS: 10.0000 mg | ORAL_TABLET | Freq: Every day | ORAL | Status: DC

## 2021-09-13 MED ORDER — ACETAMINOPHEN 650 MG RE SUPP: 650.0000 mg | Freq: Four times a day (QID) | RECTAL | Status: DC | PRN

## 2021-09-13 MED ORDER — ATORVASTATIN CALCIUM 40 MG PO TABS
40.0000 mg | ORAL_TABLET | Freq: Every day | ORAL | Status: DC
  Administered 2021-09-13: 40 mg via ORAL
  Filled 2021-09-13: qty 1

## 2021-09-13 MED ORDER — PREDNISONE 20 MG PO TABS
20.0000 mg | ORAL_TABLET | Freq: Every day | ORAL | Status: DC
  Administered 2021-09-14 – 2021-09-16 (×3): 20 mg via ORAL
  Filled 2021-09-13 (×3): qty 1

## 2021-09-13 MED ORDER — PREDNISONE 10 MG PO TABS
10.0000 mg | ORAL_TABLET | Freq: Every evening | ORAL | Status: DC
  Administered 2021-09-13 – 2021-09-15 (×3): 10 mg via ORAL
  Filled 2021-09-13 (×3): qty 1

## 2021-09-13 NOTE — Assessment & Plan Note (Addendum)
Continue MiraLAX and Colace. X-ray abdomen shows no significant stool burden. Continue current regimen.

## 2021-09-13 NOTE — ED Notes (Signed)
ED TO INPATIENT HANDOFF REPORT  ED Nurse Name and Phone #: Erick Colace, RN [phone]  S Name/Age/Gender [name] [name] 88 y.o. female Room/Bed: WA17/WA17  Code Status   Code Status: Full Code  Home/SNF/Other Home Patient oriented to: self, place, time, and situation Is this baseline? Yes   Triage Complete: Triage complete  Chief Complaint Syncope and collapse [R55]  Triage Note Pt BIBA from home. Pt found slumped over at breakfast and non-responsive according to family. Pt had a pulse, family began chest compressions. Pt has hx of CVA, limited responsiveness at baseline. Pt able to obey commands and appears alert\.  Bp: 156/72 HR: 90 SPO2: 100 CBG: 101   Allergies Allergies  Allergen Reactions   Nsaids Other (See Comments)    No nsaids due to kidney function!!!! Especially ibuprofen or Aleve.   Sulfonamide Derivatives Hives   Biaxin [Clarithromycin] Hives   Sulfa Antibiotics Hives    Other reaction(s): Unknown   Bactrim [Sulfamethoxazole-Trimethoprim] Hives and Rash   Lisinopril Cough    Level of Care/Admitting Diagnosis ED Disposition     ED Disposition  Admit   Condition  --   Comment  Hospital Area: [facility] [[id number]]  Level of Care: Telemetry [5]  Admit to tele based on following criteria: Eval of Syncope  May place patient in observation at [facility] or [facility] if equivalent level of care is available:: Yes  Covid Evaluation: Asymptomatic Screening Protocol (No Symptoms)  Diagnosis: Syncope and collapse [780.2.ICD-9-CM]  Admitting Physician: Lavina Hamman [[id number]]  Attending Physician: Lavina Hamman [[id number]]          B Medical/Surgery History Past Medical History:  Diagnosis Date   Arthritis    "knees, legs" (06/10/2016)   Ascites    Chronic kidney disease    "related to my diabetes"   Chronic lower back pain    Diabetes mellitus without complication (HCC)    Eczema    GERD (gastroesophageal reflux  disease)    High cholesterol    Hyperlipidemia    Hypertension    Hypothyroidism    OAB (overactive bladder)    Osteoporosis    Thyroid disease    Type II diabetes mellitus ([place])    Urticaria    Past Surgical History:  Procedure Laterality Date   ABDOMINAL HYSTERECTOMY     KNEE ARTHROSCOPY     LAPAROTOMY N/A 12/03/2016   Procedure: EXPLORATORY LAPAROTOMY, LYSIS OF ADHESIONS;  Surgeon: Armandina Gemma, MD;  Location: [facility];  Service: General;  Laterality: N/A;   LOOP RECORDER INSERTION N/A 08/23/2019   Procedure: LOOP RECORDER INSERTION;  Surgeon: Evans Lance, MD;  Location: [facility];  Service: Cardiovascular;  Laterality: N/A;   vocal cord polyps       A IV Location/Drains/Wounds Patient Lines/Drains/Airways Status     Active Line/Drains/Airways     Name Placement date Placement time Site Days   Peripheral IV 09/13/21 20 G Left;Posterior Hand 09/13/21  1313  Hand  less than 1   Incision (Closed) 12/03/16 Abdomen Other (Comment) 12/03/16  1528  -- 1745   Pressure Injury 12/08/16 Stage II -  Partial thickness loss of dermis presenting as a shallow open ulcer with a red, pink wound bed without slough. 12/08/16  0810  -- 1740            Intake/Output Last 24 hours  Intake/Output Summary (Last 24 hours) at 09/13/2021 1912 Last data filed at 09/13/2021 1700 Gross per 24 hour  Intake 500 ml  Output --  Net 500 ml    Labs/Imaging Results for orders placed or performed during the hospital encounter of 09/13/21 (from the past 48 hour(s))  CBC with Differential     Status: Abnormal   Collection Time: 09/13/21 11:58 AM  Result Value Ref Range   WBC 10.4 4.0 - 10.5 K/uL   RBC 3.64 (L) 3.87 - 5.11 MIL/uL   Hemoglobin 12.6 12.0 - 15.0 g/dL   HCT 39.1 36.0 - 46.0 %   MCV 107.4 (H) 80.0 - 100.0 fL   MCH 34.6 (H) 26.0 - 34.0 pg   MCHC 32.2 30.0 - 36.0 g/dL   RDW 15.4 11.5 - 15.5 %   Platelets 270 150 - 400 K/uL   nRBC 0.0 0.0 - 0.2 %   Neutrophils Relative % 85 %    Neutro Abs 8.9 (H) 1.7 - 7.7 K/uL   Lymphocytes Relative 6 %   Lymphs Abs 0.6 (L) 0.7 - 4.0 K/uL   Monocytes Relative 5 %   Monocytes Absolute 0.6 0.1 - 1.0 K/uL   Eosinophils Relative 0 %   Eosinophils Absolute 0.0 0.0 - 0.5 K/uL   Basophils Relative 1 %   Basophils Absolute 0.1 0.0 - 0.1 K/uL   Immature Granulocytes 3 %   Abs Immature Granulocytes 0.26 (H) 0.00 - 0.07 K/uL    Comment: Performed at [facility], [place] [address] [place], [place] [address]  Comprehensive metabolic panel     Status: Abnormal   Collection Time: 09/13/21  3:24 PM  Result Value Ref Range   Sodium 126 (L) 135 - 145 mmol/L   Potassium 4.3 3.5 - 5.1 mmol/L   Chloride 89 (L) 98 - 111 mmol/L   CO2 31 22 - 32 mmol/L   Glucose, Bld 67 (L) 70 - 99 mg/dL    Comment: Glucose reference range applies only to samples taken after fasting for at least 8 hours.   BUN 23 8 - 23 mg/dL   Creatinine, Ser 0.84 0.44 - 1.00 mg/dL   Calcium 9.8 8.9 - 10.3 mg/dL   Total Protein 6.4 (L) 6.5 - 8.1 g/dL   Albumin 3.4 (L) 3.5 - 5.0 g/dL   AST 33 15 - 41 U/L   ALT 44 0 - 44 U/L   Alkaline Phosphatase 62 38 - 126 U/L   Total Bilirubin 0.4 0.3 - 1.2 mg/dL   GFR, Estimated >60 >60 mL/min    Comment: (NOTE) Calculated using the CKD-EPI Creatinine Equation (2021)    Anion gap 6 5 - 15    Comment: Performed at [facility], [place] [address] [place], [place] [address]  Urinalysis, Routine w reflex microscopic Urine, Unspecified Source     Status: Abnormal   Collection Time: 09/13/21  5:10 PM  Result Value Ref Range   Color, Urine YELLOW YELLOW   APPearance HAZY (A) CLEAR   Specific Gravity, Urine 1.008 1.005 - 1.030   pH 7.0 5.0 - 8.0   Glucose, UA NEGATIVE NEGATIVE mg/dL   Hgb urine dipstick NEGATIVE NEGATIVE   Bilirubin Urine NEGATIVE NEGATIVE   Ketones, ur NEGATIVE NEGATIVE mg/dL   Protein, ur 100 (A) NEGATIVE mg/dL   Nitrite NEGATIVE NEGATIVE   Leukocytes,Ua NEGATIVE NEGATIVE   RBC  / HPF 0-5 0 - 5 RBC/hpf   WBC, UA 0-5 0 - 5 WBC/hpf   Bacteria, UA NONE SEEN NONE SEEN   Squamous Epithelial / LPF 0-5 0 - 5  Comment: Performed at [facility], [place] [address], [place], [place] [address]   DG Chest 2 View  Result Date: 09/13/2021 CLINICAL DATA:  Status post CPR EXAM: CHEST - 2 VIEW COMPARISON:  08/14/2021 FINDINGS: Cardiac size is within normal limits. There are no signs pulmonary edema or focal pulmonary consolidation. There is no pleural effusion or pneumothorax. No definite displaced fractures are seen in the ribs. There is small calcific density in the left apical region which may suggest a granuloma or bone island. IMPRESSION: No active cardiopulmonary disease. Electronically Signed   By: Elmer Picker M.D.   On: 09/13/2021 13:06   CT Head Wo Contrast  Result Date: 09/13/2021 CLINICAL DATA:  Mental status change. EXAM: CT HEAD WITHOUT CONTRAST TECHNIQUE: Contiguous axial images were obtained from the base of the skull through the vertex without intravenous contrast. RADIATION DOSE REDUCTION: This exam was performed according to the departmental dose-optimization program which includes automated exposure control, adjustment of the mA and/or kV according to patient size and/or use of iterative reconstruction technique. COMPARISON:  08/06/2021. FINDINGS: Brain: No evidence of acute infarction, hemorrhage, hydrocephalus, extra-axial collection or mass lesion/mass effect. There is ventricular and sulcal enlargement reflecting mild to moderate generalized atrophy, unchanged. Patchy white matter hypoattenuation is noted bilaterally consistent with chronic microvascular ischemic change, also stable. Old right thalamic lacunar infarct is stable. Vascular: No hyperdense vessel or unexpected calcification. Skull: Normal. Negative for fracture or focal lesion. Sinuses/Orbits: Globes and orbits are unremarkable. Visualized sinuses are clear. Other: None. IMPRESSION:  1. No acute intracranial abnormalities. 2. Atrophy and chronic microvascular ischemic change stable from the recent prior study. Electronically Signed   By: Lajean Manes M.D.   On: 09/13/2021 12:58    Pending Labs Unresulted Labs (From admission, onward)     Start     Ordered   09/14/21 0500  CBC  Tomorrow morning,   R        09/13/21 1901   09/13/21 3785  Basic metabolic panel  Now then every 4 hours,   R (with TIMED occurrences)      09/13/21 1902   09/13/21 1902  Na and K (sodium & potassium), rand urine  Once,   R        09/13/21 1902   09/13/21 1902  Creatinine, urine, random  Once,   R        09/13/21 1902   09/13/21 1902  Osmolality, urine  Once,   R        09/13/21 1902   09/13/21 1902  Osmolality  Once,   R        09/13/21 1902   09/13/21 1902  TSH  Once,   R        09/13/21 1902   09/13/21 1902  T4, free  Once,   R        09/13/21 1902   09/13/21 1858  Hemoglobin A1c  Once,   R       Comments: To assess prior glycemic control    09/13/21 1901   09/13/21 1736  Resp Panel by RT-PCR (Flu A&B, Covid) Nasopharyngeal Swab  (Tier 2 - Symptomatic/asymptomatic)  Once,   STAT        09/13/21 1735            Vitals/Pain Today's Vitals   09/13/21 1630 09/13/21 1700 09/13/21 1745 09/13/21 1840  BP: (!) 159/70 (!) 158/72 (!) 172/71   Pulse: 66 73 79 75  Resp: (!) 22 14  18 15  Temp:    98.6 F (37 C)  TempSrc:    Oral  SpO2: 100% 100% 100% 100%    Isolation Precautions No active isolations  Medications Medications  atorvastatin (LIPITOR) tablet 40 mg (has no administration in time range)  azelastine (ASTELIN) 0.1 % nasal spray 2 spray (has no administration in time range)  famotidine (PEPCID) tablet 20 mg (has no administration in time range)  levETIRAcetam (KEPPRA) tablet 500 mg (has no administration in time range)  fluticasone (FLONASE) 50 MCG/ACT nasal spray 2 spray (has no administration in time range)  montelukast (SINGULAIR) tablet 10 mg (has no  administration in time range)  predniSONE (DELTASONE) tablet 20 mg (has no administration in time range)  predniSONE (DELTASONE) tablet 10 mg (has no administration in time range)  levothyroxine (SYNTHROID) tablet 200 mcg (has no administration in time range)  insulin aspart (novoLOG) injection 0-6 Units (has no administration in time range)  enoxaparin (LOVENOX) injection 40 mg (has no administration in time range)  0.9 %  sodium chloride infusion (has no administration in time range)  acetaminophen (TYLENOL) tablet 650 mg (has no administration in time range)    Or  acetaminophen (TYLENOL) suppository 650 mg (has no administration in time range)  docusate sodium (COLACE) capsule 100 mg (has no administration in time range)  polyethylene glycol (MIRALAX / GLYCOLAX) packet 17 g (has no administration in time range)  nystatin (MYCOSTATIN) 100000 UNIT/ML suspension 500,000 Units (has no administration in time range)  lactated ringers bolus 500 mL (0 mLs Intravenous Stopped 09/13/21 1700)    Mobility walks with device High fall risk   Focused Assessments    R Recommendations: See Admitting Provider Note  Report given to:   Additional Notes:

## 2021-09-13 NOTE — Assessment & Plan Note (Addendum)
Following up with Dr. Posey Rea rheumatology outpatient. Recent diagnosis. Patient is on prednisone taper.  We will continue.  Currently on 20 mg in the morning and 10 mg in the evening.  Plan is to transition to methotrexate outpatient which will be helpful.

## 2021-09-13 NOTE — Assessment & Plan Note (Addendum)
Presents with an unresponsive event. Family actually provided CPR although patient had a pulse when EMS arrived.   CBG was 101 at the time of EMS evaluation patient had another episode of syncope while working with the EMS. At the time of my evaluation no focal deficit. Glucose 67 on admission.  Sodium 126.  EKG unremarkable.  CT head unremarkable.  Chest x-ray unremarkable. Family would like to discontinue EEG. Patient was given IV hydration.  Blood pressure now better.  No dizziness or presyncopal symptoms. Oral intake improving.  PT OT recommends home with home health.

## 2021-09-13 NOTE — ED Notes (Signed)
New sheets and brief put on.

## 2021-09-13 NOTE — Assessment & Plan Note (Addendum)
Hypoglycemic on arrival.  Now hyperglycemic. Changing dysphagia 3 diet to dysphagia 3 diet carb modified. Hemoglobin A1c 8.8 on admission. Family concerned with regarding hyperglycemia and ended up giving patient her home Levemir 7 units at 10 AM and home NovoLog 2 units at 3:20 PM on 2/26. Discussed with the family regarding concerns with risk of duplication of the medication. Family currently understand and agrees with the plan. Continue home Levemir 7 units in the morning and 3 units in the night. Continue home regimen of Levemir plus sliding scale.

## 2021-09-13 NOTE — ED Provider Notes (Signed)
[facility] DEPT Provider Note   CSN: [id number] Arrival date & time: 09/13/21  1144     History  Chief Complaint  Patient presents with   Loss of Consciousness    [name] [name] is a 88 y.o. female 88 year old female who presents via EMS from home.  She has progressive is only alert and oriented to self at baseline.  She has had a progressive decline over the last weeks to months per her family.  Patient with questionable dermatomyositis versus inclusion body myositis versus polymyositis.  They have been concerned that this may be affecting her ability to swallow as she has had some difficulty the past few weeks. She was walking with a walker a few weeks ago, but has been in the wheelchair for the last few weeks. This morning she was eating breakfast, they are concerned that she may have been choking, she had a brief moment of complete unresponsiveness with head tilted down, with chin on chest without overt loss of consciousness.  Family did not check for a pulse at this time, was instructed by the emergency responder over the phone to begin CPR if she was completely unresponsive, patient started yelling/EMS arrived and compressions were stopped at this time.  Patient had a pulse on EMS arrival. Patient's family does endorse some frequent urination over the last few days but denies any other changes other than progressive muscle weakness from general muscle condition currently being worked up.  Patient had a questionable facial droop versus other neurologic accident, but patient denies history of stroke or seizures.  Otherwise patient has not complained of any pain, she is nontoxic-appearing on arrival, she is afebrile on arrival.  Level 5 caveat: No verbal response to questioning, other than minimal nodding   Loss of Consciousness Associated symptoms: weakness       Home Medications Prior to Admission medications   Medication Sig Start Date End Date Taking?  Authorizing Provider  acetaminophen (TYLENOL) 500 MG tablet Take 500-1,000 mg by mouth at bedtime as needed for mild pain.     [provider]  amLODipine (NORVASC) 2.5 MG tablet Take 2.5 mg by mouth at bedtime. 05/16/20   [provider]  aspirin EC 81 MG tablet Take 81 mg by mouth daily.    [provider]  atorvastatin (LIPITOR) 40 MG tablet Take 40 mg by mouth at bedtime. 02/16/19   [provider]  azelastine (ASTELIN) 0.1 % nasal spray Place 2 sprays into both nostrils 2 (two) times daily. Use in each nostril as directed Patient taking differently: Place 2 sprays into both nostrils 2 (two) times daily as needed for allergies or rhinitis (when not using Nasonex). 03/15/20   Ambs, Kathrine Cords, FNP  B-D UF III MINI PEN NEEDLES 31G X 5 MM MISC USE AS DIRECTED TID 02/14/19   [provider]  cetirizine (ZYRTEC) 10 MG tablet Take 10 mg by mouth daily as needed for allergies.    [provider]  Cholecalciferol (VITAMIN D-3 PO) Take 1 capsule by mouth in the morning.    [provider]  famotidine (PEPCID) 20 MG tablet Take 20 mg by mouth at bedtime as needed for heartburn or indigestion. 04/05/20   [provider]  HYDROcodone-acetaminophen (NORCO/VICODIN) 5-325 MG tablet Take 1 tablet by mouth every 6 (six) hours as needed. 08/06/21   Valarie Merino, MD  insulin aspart (NOVOLOG FLEXPEN) 100 UNIT/ML FlexPen 2-10 Units See admin instructions. Inject into the skin three times  a day before meals, PER SLIDING SCALE: 8-10 units before breakfast, 5-7 units before lunch, and 2-4 units before supper    [provider]  insulin detemir (LEVEMIR FLEXTOUCH) 100 UNIT/ML FlexPen 3-7 Units See admin instructions. Inject 7 units into the skin in the morning and 3 units at bedtime    [provider]  Iron-FA-B Cmp-C-Biot-Probiotic (FUSION PLUS) CAPS Take 1 capsule by mouth daily with breakfast. 03/02/19   [provider]   levETIRAcetam (KEPPRA) 500 MG tablet Take 1 tablet (500 mg total) by mouth 2 (two) times daily. Patient taking differently: Take 500 mg by mouth in the morning and at bedtime. 10/11/19   Donzetta Starch, NP  losartan (COZAAR) 50 MG tablet Take 50 mg by mouth daily.  01/24/19   [provider]  mometasone (NASONEX) 50 MCG/ACT nasal spray Place 2 sprays into the nose daily as needed (for allergies or rhinitis- when not using Astelin).    [provider]  montelukast (SINGULAIR) 10 MG tablet Take 1 tablet (10 mg total) by mouth at bedtime. Patient not taking: Reported on 06/03/2021 10/17/20   Kennith Gain, MD  Multiple Vitamin (MULTIVITAMIN WITH MINERALS) TABS tablet Take 1 tablet by mouth daily.    [provider]  nystatin (MYCOSTATIN) 100000 UNIT/ML suspension Take 4 mLs by mouth at bedtime as needed (as directed).    [provider]  Omega-3 Fatty Acids (FISH OIL) 1000 MG CAPS Take 1,000 mg by mouth 2 (two) times daily.    [provider]  OneTouch Delica Lancets 85Y MISC IC-10 CODE:  E11.65  Check blood sugar    [provider]  ONETOUCH VERIO test strip 1 each 3 (three) times daily. 07/07/20   [provider]  predniSONE (DELTASONE) 20 MG tablet Take 20-30 mg by mouth See admin instructions. Take 30 mg by mouth in the morning and 20 mg at bedtime until re-check with MD A. Hawkes on 08/12/2021    [provider]  SYNTHROID 200 MCG tablet Take 200 mcg by mouth daily before breakfast. 04/23/21   [provider]  terconazole (TERAZOL 3) 0.8 % vaginal cream Place 1 applicator vaginally at bedtime. Patient not taking: Reported on 08/06/2021 03/14/21   Princess Bruins, MD  triamcinolone cream (KENALOG) 0.1 % Apply 1 application topically 2 (two) times daily as needed (to affected areas- as directed).    [provider]      Allergies    Sulfonamide derivatives, Biaxin [clarithromycin], Sulfa  antibiotics, Bactrim [sulfamethoxazole-trimethoprim], and Lisinopril    Review of Systems   Review of Systems  Reason unable to perform ROS: Poorly responsive, dementia.  Cardiovascular:  Positive for syncope.  Neurological:  Positive for weakness.  All other systems reviewed and are negative.  Physical Exam Updated Vital Signs BP (!) 145/65    Pulse 66    Temp (!) 96.5 F (35.8 C) (Rectal)    Resp 11    SpO2 99%  Physical Exam Vitals and nursing note reviewed.  Constitutional:      General: She is not in acute distress.    Appearance: Normal appearance.  HENT:     Head: Normocephalic and atraumatic.  Eyes:     General:        Right eye: No discharge.        Left eye: No discharge.  Cardiovascular:     Rate and Rhythm: Normal rate. Rhythm irregular.     Heart sounds: No murmur heard.   No  friction rub. No gallop.     Comments: Patient with occasional premature beats Pulmonary:     Effort: Pulmonary effort is normal.     Breath sounds: Normal breath sounds.  Abdominal:     General: Bowel sounds are normal.     Palpations: Abdomen is soft.     Comments: No reaction to palpation of the abdomen  Skin:    General: Skin is warm and dry.     Capillary Refill: Capillary refill takes less than 2 seconds.  Neurological:     Mental Status: She is alert. Mental status is at baseline.     Comments: Patient is alert and oriented only to herself.  She is almost nonverbal.  She can move all 4 limbs spontaneously but is quite weak throughout, 3/5 weakness bilateral upper and lower extremities.  He did not notice any focal one-sided deficits, facial droop.  Psychiatric:        Mood and Affect: Mood normal.        Behavior: Behavior normal.    ED Results / Procedures / Treatments   Labs (all labs ordered are listed, but only abnormal results are displayed) Labs Reviewed  CBC WITH DIFFERENTIAL/PLATELET - Abnormal; Notable for the following components:      Result Value   RBC 3.64 (*)     MCV 107.4 (*)    MCH 34.6 (*)    Neutro Abs 8.9 (*)    Lymphs Abs 0.6 (*)    Abs Immature Granulocytes 0.26 (*)    All other components within normal limits  URINALYSIS, ROUTINE W REFLEX MICROSCOPIC  COMPREHENSIVE METABOLIC PANEL    EKG EKG Interpretation  Date/Time:  Saturday September 13 2021 13:19:18 EST Ventricular Rate:  69 PR Interval:  191 QRS Duration: 92 QT Interval:  384 QTC Calculation: 412 R Axis:   61 Text Interpretation: Sinus rhythm Atrial premature complex Confirmed by Blanchie Dessert [phone]) on 09/13/2021 2:45:39 PM  Radiology DG Chest 2 View  Result Date: 09/13/2021 CLINICAL DATA:  Status post CPR EXAM: CHEST - 2 VIEW COMPARISON:  08/14/2021 FINDINGS: Cardiac size is within normal limits. There are no signs pulmonary edema or focal pulmonary consolidation. There is no pleural effusion or pneumothorax. No definite displaced fractures are seen in the ribs. There is small calcific density in the left apical region which may suggest a granuloma or bone island. IMPRESSION: No active cardiopulmonary disease. Electronically Signed   By: Elmer Picker M.D.   On: 09/13/2021 13:06   CT Head Wo Contrast  Result Date: 09/13/2021 CLINICAL DATA:  Mental status change. EXAM: CT HEAD WITHOUT CONTRAST TECHNIQUE: Contiguous axial images were obtained from the base of the skull through the vertex without intravenous contrast. RADIATION DOSE REDUCTION: This exam was performed according to the departmental dose-optimization program which includes automated exposure control, adjustment of the mA and/or kV according to patient size and/or use of iterative reconstruction technique. COMPARISON:  08/06/2021. FINDINGS: Brain: No evidence of acute infarction, hemorrhage, hydrocephalus, extra-axial collection or mass lesion/mass effect. There is ventricular and sulcal enlargement reflecting mild to moderate generalized atrophy, unchanged. Patchy white matter hypoattenuation is noted  bilaterally consistent with chronic microvascular ischemic change, also stable. Old right thalamic lacunar infarct is stable. Vascular: No hyperdense vessel or unexpected calcification. Skull: Normal. Negative for fracture or focal lesion. Sinuses/Orbits: Globes and orbits are unremarkable. Visualized sinuses are clear. Other: None. IMPRESSION: 1. No acute intracranial abnormalities. 2. Atrophy and chronic microvascular ischemic change stable from the recent prior study.  Electronically Signed   By: Lajean Manes M.D.   On: 09/13/2021 12:58    Procedures Procedures    Medications Ordered in ED Medications - No data to display  ED Course/ Medical Decision Making/ A&P Clinical Course as of 09/13/21 1506  Sat Sep 13, 2021  1246 Has dermatomyositis/polymyositis/inclusion body myositis? Some problem swallowing. Some sort of rash / autoimmune dysfunction.    [CP]  1324 Repeat EKG shows sinus rhythm with PACs, no evidence of fibrillatory baseline [CP]    Clinical Course User Index [CP] Miliani Deike, Joesph Fillers, PA-C                           Medical Decision Making Amount and/or Complexity of Data Reviewed Labs: ordered. Radiology: ordered.   I discussed this case with my attending physician who cosigned this note including patient's presenting symptoms, physical exam, and planned diagnostics and interventions. Attending physician stated agreement with plan or made changes to plan which were implemented.   Attending physician assessed patient at bedside.  This patient presents to the ED for concern of questionable loss of consciousness/slumping in chair, worsening of progressive disease condition, this involves an extensive number of treatment options, and is a complaint that carries with it a high risk of complications and morbidity. The emergent differential diagnosis prior to evaluation includes, but is not limited to, new stroke, cardiogenic syncope, ACS code worsening myositis versus other.   This is not an exhaustive differential.   Past Medical History / Co-morbidities: Dermatomyositis versus polymyositis, diabetes, hypothyroidism, hyperlipidemia, hypertension, acid reflux  Additional history: Additional history obtained from patient's daughters. External records from outside source obtained and reviewed including rheumatology notes.  Physical Exam: Physical exam performed. The pertinent findings include: Well-appearing patient sitting up in bed in no acute distress, only oriented to herself, following commands poorly, can move all 4 limbs spontaneously with normal coordination but significantly decreased strength  Lab Tests: I ordered, and personally interpreted labs.  The pertinent results include: CBC overall unremarkable, some macrocytosis of red blood cells but no anemia.  CMP hemolyzed initially and is still pending at this time.   Imaging Studies: I ordered imaging studies including brain, chest x-ray, CT head without contrast. I independently visualized and interpreted imaging which showed no acute intrathoracic abnormality, chronic microvascular changes in the head, no evidence of acute stroke, or mass in the head. I agree with the radiologist interpretation.   Cardiac Monitoring:  The patient was maintained on a cardiac monitor.  My attending physician Dr. Maryan Rued viewed and interpreted the cardiac monitored which showed an underlying rhythm of: Normal sinus rhythm with PACs on repeat EKG with good baseline.   On reevaluation patient is more responsive, answering in short sentences, still only oriented to self.  I still see no significant neurologic deficits, and daughter at bedside reports that she is close to her baseline.  She has had a history of questionable syncope versus seizure versus strokelike symptoms in the past, and has had extensive work-up with no significant conclusion.  It seems that she may have had another 1 of these episodes today.  I do want to  recheck her metabolic panel as it is possible her symptoms could be related to hyponatremia, hypokalemia, or other electrolyte abnormality, but as long as her lab work is fine discussed with daughter do not believe that she needs additional work-up at this time, encouraged close follow-up with her PCP, rheumatologist.  3:06 PM Care  of [name] [name] transferred to [facility] and Dr. Vanita Panda at the end of my shift as the patient will require reassessment once labs/imaging have resulted. Patient presentation, ED course, and plan of care discussed with review of all pertinent labs and imaging. Please see his/her note for further details regarding further ED course and disposition. Plan at time of handoff is pending CMP okay for discharge to home, close follow up with PCP, rheumatology. This may be altered or completely changed at the discretion of the oncoming team pending results of further workup.  Final Clinical Impression(s) / ED Diagnoses Final diagnoses:  None    Rx / DC Orders ED Discharge Orders     None         Dorien Chihuahua 09/13/21 1506    Blanchie Dessert, MD 09/14/21 [phone]

## 2021-09-13 NOTE — ED Triage Notes (Signed)
Pt BIBA from home. Pt found slumped over at breakfast and non-responsive according to family. Pt had a pulse, family began chest compressions. Pt has hx of CVA, limited responsiveness at baseline. Pt able to obey commands and appears alert\.  Bp: 156/72 HR: 90 SPO2: 100 CBG: 101

## 2021-09-13 NOTE — Assessment & Plan Note (Signed)
-  Continue Lipitor °

## 2021-09-13 NOTE — ED Notes (Signed)
In and Out cath attempted. Unsuccessful, pt voided just prior.

## 2021-09-13 NOTE — Assessment & Plan Note (Addendum)
Nystatin ordered.  Improving. In the setting of prednisone use.

## 2021-09-13 NOTE — Assessment & Plan Note (Addendum)
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 Most of the rash is resolved but the rash on her bottom has persisted. Family is using steroid cream for that. Does not appear infected. Wound care consulted.  Outpatient wound care pending recommended.

## 2021-09-13 NOTE — H&P (Signed)
History and Physical    Patient: [name] [name] DGU:[id number] DOB: [birth year] DOA: 09/13/2021 DOS: the patient was seen and examined on 09/13/2021 PCP: Lajean Manes, MD  Patient coming from: Home  Chief Complaint:  Chief Complaint  Patient presents with   Loss of Consciousness    HPI: [name] [name] is a 88 y.o. female with medical history significant of HTN, HLD, type II DM, PAD, hypothyroidism, recent diagnosis of dermatomyositis on steroids. Patient presented with complaints of a syncopal event. History was obtained from family as the patient is unable to provide detailed history. Patient was recently constipated and had poor p.o. intake. Along with that the patient difficulty swallowing and was seen by speech therapy recently and was on dysphagia 3 diet. This morning, family checked her sugar and it was in 90s and patient received her NovoLog insulin according to sliding scale, after which while eating breakfast patient became unresponsive.  Family called 911, patient was lowered to the ground and since they were not able to check the pulse they started CPR per instructions.  While receiving CPR patient started moaning.  When EMS arrived patient had a pulse.  Her CBG was 101.  While EMS was evaluating the patient patient had another syncopal event at which time decision was made to bring her to the hospital. No fall no trauma no injury reported.  No fever no chills.  No nausea or vomiting. Patient is reportedly constipated last bowel movement was 2 days ago. Patient has poor p.o. intake for last few days secondary to constipation.  Review of Systems: As mentioned in the history of present illness. All other systems reviewed and are negative. Past Medical History:  Diagnosis Date   Arthritis    "knees, legs" (06/10/2016)   Ascites    Chronic kidney disease    "related to my diabetes"   Chronic lower back pain    Diabetes mellitus without complication (HCC)    Eczema    GERD  (gastroesophageal reflux disease)    High cholesterol    Hyperlipidemia    Hypertension    Hypothyroidism    OAB (overactive bladder)    Osteoporosis    Thyroid disease    Type II diabetes mellitus ([place])    Urticaria    Past Surgical History:  Procedure Laterality Date   ABDOMINAL HYSTERECTOMY     KNEE ARTHROSCOPY     LAPAROTOMY N/A 12/03/2016   Procedure: EXPLORATORY LAPAROTOMY, LYSIS OF ADHESIONS;  Surgeon: Armandina Gemma, MD;  Location: [facility];  Service: General;  Laterality: N/A;   LOOP RECORDER INSERTION N/A 08/23/2019   Procedure: LOOP RECORDER INSERTION;  Surgeon: Evans Lance, MD;  Location: [facility];  Service: Cardiovascular;  Laterality: N/A;   vocal cord polyps     Social History:  reports that she has never smoked. She quit smokeless tobacco use about 38 years ago.  Her smokeless tobacco use included chew. She reports that she does not drink alcohol and does not use drugs.  Allergies  Allergen Reactions   Nsaids Other (See Comments)    No nsaids due to kidney function!!!! Especially ibuprofen or Aleve.   Biaxin [Clarithromycin] Hives   Sulfa Antibiotics Hives and Other (See Comments)    NO sulfa-based meds!!   Bactrim [Sulfamethoxazole-Trimethoprim] Hives and Rash   Lisinopril Cough    Family History  Problem Relation Age of Onset   Diabetes Mother    Hypertension Mother    Asthma Father    Diabetes Sister  Stomach cancer Sister    Diabetes Brother    Allergic rhinitis Neg Hx    Eczema Neg Hx    Urticaria Neg Hx     Prior to Admission medications   Medication Sig Start Date End Date Taking? Authorizing Provider  acetaminophen (TYLENOL) 650 MG CR tablet Take 1,300 mg by mouth at bedtime as needed for pain.   Yes [provider]  amLODipine (NORVASC) 2.5 MG tablet Take 2.5 mg by mouth in the morning. 05/16/20  Yes [provider]  atorvastatin (LIPITOR) 40 MG tablet Take 40 mg by mouth at bedtime. 02/16/19  Yes [provider]  BAYER LOW DOSE 81 MG EC tablet Take 81 mg by mouth in the morning. Swallow whole.   Yes [provider]  cetirizine (ZYRTEC) 10 MG tablet Take 10 mg by mouth daily as needed for allergies.   Yes [provider]  Cholecalciferol (VITAMIN D-3 PO) Take 10,000 Units by mouth in the morning.   Yes [provider]  Famotidine (PEPCID AC PO) Take 1 tablet by mouth at bedtime.   Yes [provider]  fluticasone (FLONASE) 50 MCG/ACT nasal spray Place 1-2 sprays into both nostrils daily as needed for allergies or rhinitis.   Yes [provider]  insulin aspart (NOVOLOG FLEXPEN) 100 UNIT/ML FlexPen 2-10 Units See admin instructions. Inject 2-10 units into the skin three times a day with meals, per sliding scale:  Breakfast: BGL 80-199 = 8 units; 200-299 = 9 units; 300 or greater = 10 units Lunch: BGL 80-199 = 5 units; 200-299 = 6 units; 300 or greater = 7 units Supper/evening meal: BGL 80-199 = 2 units; 200-299 = 3 units; 300 or greater = 4 units   Yes [provider]  insulin detemir (LEVEMIR FLEXTOUCH) 100 UNIT/ML FlexPen 3-7 Units See admin instructions. Inject 7 units into the skin in the morning and 3 units at bedtime if BGL is 200 or below and 4 units if BGL is greater than 200   Yes [provider]  Iron-FA-B Cmp-C-Biot-Probiotic (FUSION PLUS) CAPS Take 1 capsule by mouth daily with breakfast. 03/02/19  Yes [provider]  levETIRAcetam (KEPPRA) 500 MG tablet Take 1 tablet (500 mg total) by mouth 2 (two) times daily. Patient taking differently: Take 500 mg by mouth in the morning and at bedtime. 10/11/19  Yes Donzetta Starch, NP  losartan (COZAAR) 50 MG tablet Take 50 mg by mouth at bedtime. 01/24/19  Yes [provider]  Multiple Vitamins-Calcium (ONE-A-DAY WOMENS FORMULA) TABS Take 1 tablet by mouth daily after breakfast.   Yes [provider]  NON FORMULARY Take 1 capsule by mouth See admin  instructions. Fish oil 1,000 mg/Omega-3 300 mg capsules- Take 1 capsule by mouth every morning and at bedtime   Yes [provider]  predniSONE (DELTASONE) 20 MG tablet Take 10-20 mg by mouth See admin instructions. Take 20 mg by mouth in the morning and 10 mg at bedtime- through 09/16/2021. Decrease to 10 mg by mouth in the morning and at bedtime as of 09/17/2021. Further directions to be provided by MD [facility].   Yes [provider]  SYNTHROID 200 MCG tablet Take 200 mcg by mouth daily before breakfast. 04/23/21  Yes [provider]  azelastine (ASTELIN) 0.1 % nasal spray Place 2 sprays into both nostrils 2 (two) times daily. Use in each nostril as directed Patient not taking: Reported on 09/13/2021 03/15/20   Dara Hoyer, FNP  B-D  UF III MINI PEN NEEDLES 31G X 5 MM MISC USE AS DIRECTED TID 02/14/19   [provider]  famotidine (PEPCID) 20 MG tablet Take 20 mg by mouth at bedtime as needed for heartburn or indigestion. Patient not taking: Reported on 09/13/2021 04/05/20   [provider]  HYDROcodone-acetaminophen (NORCO/VICODIN) 5-325 MG tablet Take 1 tablet by mouth every 6 (six) hours as needed. Patient not taking: Reported on 09/13/2021 08/06/21   Valarie Merino, MD  mometasone (NASONEX) 50 MCG/ACT nasal spray Place 2 sprays into the nose daily as needed (for allergies or rhinitis- when not using Astelin). Patient not taking: Reported on 09/13/2021    [provider]  montelukast (SINGULAIR) 10 MG tablet Take 1 tablet (10 mg total) by mouth at bedtime. Patient not taking: Reported on 09/13/2021 10/17/20   Kennith Gain, MD  OneTouch Delica Lancets 67H MISC IC-10 CODE:  E11.65  Check blood sugar    [provider]  [facility] VERIO test strip 1 each 3 (three) times daily. 07/07/20   [provider]  terconazole (TERAZOL 3) 0.8 % vaginal cream Place 1 applicator vaginally at bedtime. Patient not taking: Reported on  09/13/2021 03/14/21   Princess Bruins, MD    Physical Exam: Vitals:   09/13/21 1630 09/13/21 1700 09/13/21 1745 09/13/21 1840  BP: (!) 159/70 (!) 158/72 (!) 172/71   Pulse: 66 73 79 75  Resp: (!) 22 14 18 15   Temp:    98.6 F (37 C)  TempSrc:    Oral  SpO2: 100% 100% 100% 100%  General: Appear in mild distress, no Rash; Oral Mucosa shows thrush. no Abnormal Neck Mass Or lumps, Conjunctiva normal, Cardiovascular: S1 and S2 Present, no Murmur, Respiratory: good respiratory effort, Bilateral Air entry present and CTA, no Crackles, no wheezes Abdomen: Bowel Sound present, Soft and no tenderness Extremities: no Pedal edema Neurology: alert and oriented to time, place, and person affect appropriate. no new focal deficit Gait not checked due to patient safety concerns    Data Reviewed: I have Reviewed nursing notes, Vitals, and Lab results since pt's last encounter. Pertinent lab results CBC and CMP I have ordered test including CBC, CMP, TSH, free T4, osmolarity, urine osmolarity, urine sodium urine potassium urine creatinine, hemoglobin A1c I have independently visualized and interpreted EKG which showed EKG: normal sinus rhythm, nonspecific ST and T waves changes. I have reviewed the last note from EDP and neurology,  I have discussed pt's care plan and test results with EDP.   Assessment and Plan: * Syncope and collapse- (present on admission) Presents with an unresponsive event. Family actually provided CPR although patient had a pulse when EMS arrived.  CBG was 101 at the time of EMS evaluation patient had another episode of syncope while working with the EMS. At the time of my evaluation no focal deficit. Glucose 67 on admission.  Sodium 126.  EKG unremarkable.  CT head unremarkable.  Chest x-ray unremarkable. We will check orthostatics, EEG and continue with IV fluid hydration. PT OT consulted.  Hyponatremia- (present on admission) Check serum muscularity, urine osmolarity,  serum BMP every 4 hours, urine sodium, urine creatinine, urine potassium. Treat with IV normal saline.  Poor appetite- (present on admission) Hyponatremia Hypoglycemia Patient reportedly have poor p.o. intake in the setting of constipation as well as thrush. Continue nutritional supplements. We will provide IV hydration with normal saline.  Constipation- (present on admission) MiraLAX and Colace.[name]- (present on admission) Nystatin ordered. In the  setting of prednisone use.  Uncontrolled diabetes mellitus with hypoglycemia without coma, with long-term current use of insulin ([place]) Hypoglycemic here in the hospital. May have some hypoglycemic episode while being on steroids. Check hemoglobin A1c. Continue with very sensitive sliding scale. Hold long-acting insulin.  Dermatopolymyositis, unspecified, organ involvement unspecified ([place])- (present on admission) Following up with Dr. Posey Rea rheumatology outpatient. Recent diagnosis. Patient is on prednisone taper.  We will continue.  Currently on 20 mg in the morning and 10 mg in the evening.  Hypothyroidism, acquired- (present on admission) Continue Synthroid.  Check TSH and free T4.  Essential hypertension- (present on admission) Blood pressure well controlled.  Holding amlodipine.  Hyperlipidemia- (present on admission) Continue Lipitor.  GERD (gastroesophageal reflux disease)- (present on admission) Continue Pepcid.  PAD (peripheral artery disease) ([place])- (present on admission) On aspirin.   Advance Care Planning:   Code Status: DNR discussed with daughter at bedside.  Consults: None  Family Communication: Daughter was at bedside.  All questions answered.  Severity of Illness: The appropriate patient status for this patient is OBSERVATION. Observation status is judged to be reasonable and necessary in order to provide the required intensity of service to ensure the patient's safety. The patient's presenting  symptoms, physical exam findings, and initial radiographic and laboratory data in the context of their medical condition is felt to place them at decreased risk for further clinical deterioration. Furthermore, it is anticipated that the patient will be medically stable for discharge from the hospital within 2 midnights of admission.   Author: Berle Mull, MD 09/13/2021 7:24 PM  For on call review [email or web address].

## 2021-09-13 NOTE — Assessment & Plan Note (Signed)
On aspirin. °

## 2021-09-13 NOTE — Assessment & Plan Note (Addendum)
Blood pressure well controlled.  Will resume amlodipine. Hold losartan in the setting of hyponatremia

## 2021-09-13 NOTE — Assessment & Plan Note (Addendum)
Continue Synthroid.  Normal TSH and normal free T4.

## 2021-09-13 NOTE — Assessment & Plan Note (Addendum)
Patient reportedly have poor p.o. intake in the setting of constipation as well as thrush. Continue nutritional supplements. Patient's long-acting insulin was on hold initially. Continue home regimen on discharge.Marland Kitchen

## 2021-09-13 NOTE — Assessment & Plan Note (Signed)
Continue Pepcid  

## 2021-09-13 NOTE — ED Provider Notes (Signed)
Care of patient assumed from [place] at 303pm.  Agree with history, physical exam and plan.  See their note for further details. Briefly, patient has a history of dementia and at baseline is alert to self only.  Patient has had progressive decline over the last weeks to months.  Patient with questionable dermatomyositis versus inclusion body myositis versus polymyositis.  Family is concerned this may be affecting her ability to swallow.  This morning while eating breakfast they are concerned she may have been choking she had a brief moment of unresponsiveness that lasted approximately 20 seconds.  Family began to perform CPR however did not check for pulse.  Upon EMS arrival patient had a pulse.  Patient family reports that this episode of unresponsiveness similar to previous episodes she has had in the past.  Patient is back to her mental baseline at this time.  No seizure-like activity.    Physical Exam  BP (!) 145/65    Pulse 66    Temp (!) 96.5 F (35.8 C) (Rectal)    Resp 11    SpO2 99%   Physical Exam Vitals and nursing note reviewed.  Constitutional:      General: She is not in acute distress.    Appearance: She is not ill-appearing, toxic-appearing or diaphoretic.  HENT:     Head: Normocephalic.  Eyes:     General: No scleral icterus.       Right eye: No discharge.        Left eye: No discharge.  Cardiovascular:     Rate and Rhythm: Normal rate.  Pulmonary:     Effort: Pulmonary effort is normal.  Chest:     Chest wall: No mass, lacerations, deformity, swelling, tenderness or crepitus.  Skin:    General: Skin is warm and dry.  Neurological:     General: No focal deficit present.     Mental Status: She is alert.     GCS: GCS eye subscore is 4. GCS verbal subscore is 5. GCS motor subscore is 6.     Comments: Alert to person only  Psychiatric:        Behavior: Behavior is cooperative.    Procedures  .Critical Care Performed by: Loni Beckwith, PA-C Authorized  by: Loni Beckwith, PA-C   Critical care provider statement:    Critical care time (minutes):  30   Critical care was necessary to treat or prevent imminent or life-threatening deterioration of the following conditions:  Metabolic crisis   Critical care was time spent personally by me on the following activities:  Development of treatment plan with patient or surrogate, evaluation of patient's response to treatment, examination of patient, ordering and review of laboratory studies, ordering and review of radiographic studies, ordering and performing treatments and interventions, pulse oximetry, re-evaluation of patient's condition, review of old charts and obtaining history from patient or surrogate   Care discussed with: admitting provider    ED Course / MDM   Clinical Course as of 09/13/21 1503  Sat Sep 13, 2021  1246 Has dermatomyositis/polymyositis/inclusion body myositis? Some problem swallowing. Some sort of rash / autoimmune dysfunction.    [CP]  1324 Repeat EKG shows sinus rhythm with PACs, no evidence of fibrillatory baseline [CP]    Clinical Course User Index [CP] Prosperi, Joesph Fillers, PA-C   Medical Decision Making Amount and/or Complexity of Data Reviewed Labs: ordered. Radiology: ordered.  Risk Decision regarding hospitalization.   At time of handoff CMP and urinalysis pending.  Plan to discharge to follow-up with PCP and rheumatologist if lab work is unremarkable.  Patient's daughter at bedside reports concern for possible dehydration.  Will give patient 500 mL LR fluid bolus.  Lab testing was independently reviewed and interpreted by myself. Urinalysis shows no signs of infection. CMP shows sodium of 126, chloride 89, glucose 67.  Lab results and plan were discussed with patient's daughter at bedside.  Patient's daughter is agreeable with this plan.  Due to hyponatremia in the setting of transient altered mental status will consult hospitalist for  admission.  I spoke with Dr. Posey Pronto who will see the patient for admission.    Loni Beckwith, PA-C 09/13/21 1851    Carmin Muskrat, MD 09/13/21 2249

## 2021-09-13 NOTE — Assessment & Plan Note (Addendum)
No evidence of sleep dizziness or syncope.Serum osmolarity normal.  Urine osmolality relatively normal.  Urine sodium relatively normal.  Repeat testing on 08/2015 also normal. Sodium did not improve with IV and normal saline infusion. Later on started on urea tablets without any improvement. Nephrology consulted.  Continue fluid restricted diet along with sodium tablets. Discontinue losartan.  Sodium improved.  Continue sodium tablets for a few days.,  Outpatient follow-up with PCP with repeat CBC and CMP.

## 2021-09-14 ENCOUNTER — Other Ambulatory Visit: Payer: Self-pay

## 2021-09-14 DIAGNOSIS — U071 COVID-19: Secondary | ICD-10-CM | POA: Diagnosis present

## 2021-09-14 DIAGNOSIS — R55 Syncope and collapse: Secondary | ICD-10-CM | POA: Diagnosis not present

## 2021-09-14 LAB — RESP PANEL BY RT-PCR (FLU A&B, COVID) ARPGX2
Influenza A by PCR: NEGATIVE
Influenza B by PCR: NEGATIVE
SARS Coronavirus 2 by RT PCR: POSITIVE — AB

## 2021-09-14 LAB — BASIC METABOLIC PANEL
Anion gap: 6 (ref 5–15)
Anion gap: 8 (ref 5–15)
Anion gap: 8 (ref 5–15)
Anion gap: 9 (ref 5–15)
BUN: 16 mg/dL (ref 8–23)
BUN: 16 mg/dL (ref 8–23)
BUN: 18 mg/dL (ref 8–23)
BUN: 28 mg/dL — ABNORMAL HIGH (ref 8–23)
CO2: 23 mmol/L (ref 22–32)
CO2: 25 mmol/L (ref 22–32)
CO2: 28 mmol/L (ref 22–32)
CO2: 28 mmol/L (ref 22–32)
Calcium: 8.7 mg/dL — ABNORMAL LOW (ref 8.9–10.3)
Calcium: 8.8 mg/dL — ABNORMAL LOW (ref 8.9–10.3)
Calcium: 9 mg/dL (ref 8.9–10.3)
Calcium: 9.4 mg/dL (ref 8.9–10.3)
Chloride: 91 mmol/L — ABNORMAL LOW (ref 98–111)
Chloride: 92 mmol/L — ABNORMAL LOW (ref 98–111)
Chloride: 92 mmol/L — ABNORMAL LOW (ref 98–111)
Chloride: 95 mmol/L — ABNORMAL LOW (ref 98–111)
Creatinine, Ser: 0.53 mg/dL (ref 0.44–1.00)
Creatinine, Ser: 0.67 mg/dL (ref 0.44–1.00)
Creatinine, Ser: 0.68 mg/dL (ref 0.44–1.00)
Creatinine, Ser: 0.69 mg/dL (ref 0.44–1.00)
GFR, Estimated: >60 mL/min (ref >60)
GFR, Estimated: >60 mL/min (ref >60)
GFR, Estimated: >60 mL/min (ref >60)
GFR, Estimated: >60 mL/min (ref >60)
Glucose, Bld: 129 mg/dL — ABNORMAL HIGH (ref 70–99)
Glucose, Bld: 192 mg/dL — ABNORMAL HIGH (ref 70–99)
Glucose, Bld: 242 mg/dL — ABNORMAL HIGH (ref 70–99)
Glucose, Bld: 327 mg/dL — ABNORMAL HIGH (ref 70–99)
Potassium: 4.6 mmol/L (ref 3.5–5.1)
Potassium: 4.7 mmol/L (ref 3.5–5.1)
Potassium: 4.8 mmol/L (ref 3.5–5.1)
Potassium: 4.8 mmol/L (ref 3.5–5.1)
Sodium: 124 mmol/L — ABNORMAL LOW (ref 135–145)
Sodium: 126 mmol/L — ABNORMAL LOW (ref 135–145)
Sodium: 127 mmol/L — ABNORMAL LOW (ref 135–145)
Sodium: 128 mmol/L — ABNORMAL LOW (ref 135–145)

## 2021-09-14 LAB — CBC
HCT: 33.1 % — ABNORMAL LOW (ref 36.0–46.0)
Hemoglobin: 11.3 g/dL — ABNORMAL LOW (ref 12.0–15.0)
MCH: 34.1 pg — ABNORMAL HIGH (ref 26.0–34.0)
MCHC: 34.1 g/dL (ref 30.0–36.0)
MCV: 100 fL (ref 80.0–100.0)
Platelets: 293 10*3/uL (ref 150–400)
RBC: 3.31 MIL/uL — ABNORMAL LOW (ref 3.87–5.11)
RDW: 15.1 % (ref 11.5–15.5)
WBC: 7.7 10*3/uL (ref 4.0–10.5)
nRBC: 0 % (ref 0.0–0.2)

## 2021-09-14 LAB — GLUCOSE, CAPILLARY
Glucose-Capillary: 250 mg/dL — ABNORMAL HIGH (ref 70–99)
Glucose-Capillary: 297 mg/dL — ABNORMAL HIGH (ref 70–99)
Glucose-Capillary: 301 mg/dL — ABNORMAL HIGH (ref 70–99)
Glucose-Capillary: 257 mg/dL — ABNORMAL HIGH (ref 70–99)
Glucose-Capillary: 248 mg/dL — ABNORMAL HIGH (ref 70–99)

## 2021-09-14 LAB — HEMOGLOBIN A1C
Hgb A1c MFr Bld: 8.8 % — ABNORMAL HIGH (ref 4.8–5.6)
Mean Plasma Glucose: 205.86 mg/dL

## 2021-09-14 LAB — T4, FREE: Free T4: 1.78 ng/dL — ABNORMAL HIGH (ref 0.61–1.12)

## 2021-09-14 LAB — OSMOLALITY, URINE: Osmolality, Ur: 287 mOsm/kg — ABNORMAL LOW (ref 300–900)

## 2021-09-14 LAB — OSMOLALITY: Osmolality: 278 mOsm/kg (ref 275–295)

## 2021-09-14 MED ORDER — INSULIN ASPART 100 UNIT/ML IJ SOLN
0.0000 [IU] | Freq: Three times a day (TID) | INTRAMUSCULAR | Status: DC
  Administered 2021-09-15: 5 [IU] via SUBCUTANEOUS
  Administered 2021-09-15: 15 [IU] via SUBCUTANEOUS
  Administered 2021-09-15: 2 [IU] via SUBCUTANEOUS
  Administered 2021-09-16: 3 [IU] via SUBCUTANEOUS

## 2021-09-14 MED ORDER — INSULIN ASPART 100 UNIT/ML IJ SOLN
0.0000 [IU] | Freq: Three times a day (TID) | INTRAMUSCULAR | Status: DC
  Administered 2021-09-14: 8 [IU] via SUBCUTANEOUS

## 2021-09-14 MED ORDER — INSULIN ASPART 100 UNIT/ML IJ SOLN: 10.0000 [IU] | Freq: Once | INTRAMUSCULAR | Status: DC

## 2021-09-14 MED ORDER — AMLODIPINE BESYLATE 5 MG PO TABS
2.5000 mg | ORAL_TABLET | Freq: Every day | ORAL | Status: DC
  Administered 2021-09-15 – 2021-09-16 (×2): 2.5 mg via ORAL
  Filled 2021-09-14 (×2): qty 1

## 2021-09-14 MED ORDER — LOSARTAN POTASSIUM 50 MG PO TABS
50.0000 mg | ORAL_TABLET | Freq: Every day | ORAL | Status: DC
  Administered 2021-09-14: 50 mg via ORAL
  Filled 2021-09-14: qty 1

## 2021-09-14 MED ORDER — INSULIN DETEMIR 100 UNIT/ML ~~LOC~~ SOLN
3.0000 [IU] | Freq: Every day | SUBCUTANEOUS | Status: DC
  Administered 2021-09-14 – 2021-09-15 (×2): 3 [IU] via SUBCUTANEOUS
  Filled 2021-09-14 (×3): qty 0.03

## 2021-09-14 MED ORDER — INSULIN ASPART 100 UNIT/ML IJ SOLN: 0.0000 [IU] | Freq: Every day | INTRAMUSCULAR | Status: DC

## 2021-09-14 MED ORDER — INSULIN DETEMIR 100 UNIT/ML ~~LOC~~ SOLN
7.0000 [IU] | Freq: Every day | SUBCUTANEOUS | Status: DC
  Administered 2021-09-14 – 2021-09-16 (×3): 7 [IU] via SUBCUTANEOUS
  Filled 2021-09-14 (×3): qty 0.07

## 2021-09-14 MED ORDER — NIRMATRELVIR/RITONAVIR (PAXLOVID) TABLET (RENAL DOSING)
2.0000 | ORAL_TABLET | Freq: Two times a day (BID) | ORAL | Status: DC
  Administered 2021-09-14: 2 via ORAL
  Filled 2021-09-14: qty 20

## 2021-09-14 MED ORDER — INSULIN ASPART 100 UNIT/ML IJ SOLN
0.0000 [IU] | Freq: Every day | INTRAMUSCULAR | Status: DC
  Administered 2021-09-14: 2 [IU] via SUBCUTANEOUS
  Administered 2021-09-15: 5 [IU] via SUBCUTANEOUS

## 2021-09-14 MED ORDER — UREA 15 G PO PACK
15.0000 g | PACK | Freq: Two times a day (BID) | ORAL | Status: DC
  Administered 2021-09-14 (×2): 15 g via ORAL
  Filled 2021-09-14 (×3): qty 1

## 2021-09-14 MED ORDER — ZINC OXIDE 40 % EX OINT
TOPICAL_OINTMENT | Freq: Three times a day (TID) | CUTANEOUS | Status: DC
  Administered 2021-09-15: 1 via TOPICAL
  Filled 2021-09-14: qty 57

## 2021-09-14 MED ORDER — INSULIN ASPART 100 UNIT/ML IJ SOLN
8.0000 [IU] | Freq: Once | INTRAMUSCULAR | Status: AC
  Administered 2021-09-14: 8 [IU] via SUBCUTANEOUS

## 2021-09-14 NOTE — Plan of Care (Signed)
  Problem: Nutrition: Goal: Adequate nutrition will be maintained Outcome: Progressing   Problem: Coping: Goal: Level of anxiety will decrease Outcome: Progressing   Problem: Elimination: Goal: Will not experience complications related to bowel motility Outcome: Progressing Goal: Will not experience complications related to urinary retention Outcome: Progressing   

## 2021-09-14 NOTE — Progress Notes (Signed)
Pt instructed on Flutter Valve but unable to perform due to mental status.

## 2021-09-14 NOTE — Assessment & Plan Note (Signed)
Incidentally positive.  No prior history of COVID. Family reports that no one in the family has tested positive COVID on 2/26. Discussed with patient and verbalized understanding for starting her on Paxlovid based on the positivity.  Daughters would not like to continue Paxlovid and medicine is now discontinued. CT value 40. Will maintain isolation for now. Recommend family to monitor symptoms for next 4 to 10 days.  Patient will be eligible for initiating Paxlovid until 09/19/2021.

## 2021-09-14 NOTE — Evaluation (Signed)
Physical Therapy Evaluation Patient Details Name: [name] [name] MRN: [id number] DOB: [birth year] Today's Date: 09/14/2021  History of Present Illness  Patient is 88 y.o. female presented to [facility] after syncopal event when family found pt slumped over at breakfast and non-responsive, pt had a pulse, family  called 911 and were instructed to began chest compressions but pt then became alert. PMH significant for CVA, HTN, HLD, type II DM, PAD, hypothyroidism, recent diagnosis of dermatomyositis on steroids.    Clinical Impression  [name] [name] is 88 y.o. female admitted with above HPI and diagnosis. Patient is currently limited by functional impairments below (see PT problem list). Patient lives with her daughters and requires assist for household mobility with RW and ADL's at baseline. Currently she requires min-mod assist for transfers and gait with RW. Patient will benefit from continued skilled PT interventions to address impairments and progress independence with mobility, recommending return home with family assist and HHPT. Acute PT will follow and progress as able.        Recommendations for follow up therapy are one component of a multi-disciplinary discharge planning process, led by the attending physician.  Recommendations may be updated based on patient status, additional functional criteria and insurance authorization.  Follow Up Recommendations Home health PT    Assistance Recommended at Discharge Frequent or constant Supervision/Assistance  Patient can return home with the following  A little help with walking and/or transfers;A little help with bathing/dressing/bathroom;Assistance with cooking/housework;Direct supervision/assist for medications management;Assist for transportation;Help with stairs or ramp for entrance    Equipment Recommendations None recommended by PT  Recommendations for Other Services       Functional Status Assessment Patient has had a recent decline in  their functional status and demonstrates the ability to make significant improvements in function in a reasonable and predictable amount of time.     Precautions / Restrictions Precautions Precautions: Fall Restrictions Weight Bearing Restrictions: No      Mobility  Bed Mobility Overal bed mobility: Needs Assistance Bed Mobility: Supine to Sit     Supine to sit: Min assist, HOB elevated     General bed mobility comments: cues to sequence/initiate bring LE's off EOB and raising trunk. pt required mod    Transfers Overall transfer level: Needs assistance Equipment used: Rolling walker (2 wheels) Transfers: Sit to/from Stand Sit to Stand: Min assist, Mod assist, From elevated surface           General transfer comment: Mod assist on initial rise due to significant posterior lean. pt required assist to shift weight anteriorly. Min assist with rise from toilet and recliner with cues for hand placement on grab bar and arm rest.    Ambulation/Gait Ambulation/Gait assistance: Mod assist, Min assist Gait Distance (Feet): 40 Feet (2x20) Assistive device: Rolling walker (2 wheels) Gait Pattern/deviations: Step-through pattern, Decreased stride length, Shuffle, Trunk flexed, Leaning posteriorly Gait velocity: decr     General Gait Details: pt fluctuating between min-mod assist with therapist facilitating anterior weight shift to prevent LOB posteriorly. pt also demonstrates tendency to drift Rt during gait. amb short distance to bathroom and back, cues needed for safe walker position during turns.  Stairs            Wheelchair Mobility    Modified Rankin (Stroke Patients Only)       Balance Overall balance assessment: Needs assistance Sitting-balance support: Feet supported, Bilateral upper extremity supported Sitting balance-Leahy Scale: Fair   Postural control: Posterior lean Standing balance support: Reliant  on assistive device for balance, During functional  activity, Bilateral upper extremity supported Standing balance-Leahy Scale: Poor                               Pertinent Vitals/Pain Pain Assessment Pain Assessment: Faces Faces Pain Scale: No hurt Pain Intervention(s): Monitored during session, Repositioned    Home Living Family/patient expects to be discharged to:: Private residence Living Arrangements: Children Available Help at Discharge: Family Type of Home: House Home Access: Stairs to enter Entrance Stairs-Rails: None Entrance Stairs-Number of Steps: 3+1   Home Layout: One level Home Equipment: Conservation officer, nature (2 wheels);Rollator (4 wheels);Wheelchair - manual;Toilet riser Additional Comments: pt satys with one daughter Mon-Thurs and then goes back to her own house where another daugther helps her.    Prior Function Prior Level of Function : Needs assist             Mobility Comments: pt using RW for transfers/gait with some use of wc if pt too weak to walk room-to-room. Daughters assist with transfers into shower. ADLs Comments: pt requires assist for bathing/dressing from daughters.     Hand Dominance        Extremity/Trunk Assessment   Upper Extremity Assessment Upper Extremity Assessment: Defer to OT evaluation    Lower Extremity Assessment Lower Extremity Assessment: Generalized weakness;RLE deficits/detail;LLE deficits/detail RLE Deficits / Details: 4-/5 for LE's grossly throughout. RLE Sensation: decreased proprioception LLE Deficits / Details: 4-/5 for LE's grossly throughout. LLE Sensation: decreased proprioception    Cervical / Trunk Assessment Cervical / Trunk Assessment: Kyphotic  Communication   Communication: No difficulties  Cognition Arousal/Alertness: Awake/alert Behavior During Therapy: WFL for tasks assessed/performed Overall Cognitive Status: Impaired/Different from baseline Area of Impairment: Orientation, Memory, Following commands, Problem solving, Safety/judgement                  Orientation Level: Disoriented to, Time, Situation   Memory: Decreased short-term memory, Decreased recall of precautions Following Commands: Follows one step commands with increased time Safety/Judgement: Decreased awareness of deficits   Problem Solving: Slow processing, Difficulty sequencing, Requires verbal cues          General Comments      Exercises     Assessment/Plan    PT Assessment Patient needs continued PT services  PT Problem List Decreased strength;Decreased activity tolerance;Decreased balance;Decreased mobility;Decreased cognition;Decreased knowledge of use of DME;Decreased safety awareness;Decreased knowledge of precautions       PT Treatment Interventions Gait training;Stair training;Functional mobility training;DME instruction;Therapeutic activities;Therapeutic exercise;Balance training;Patient/family education    PT Goals (Current goals can be found in the Care Plan section)  Acute Rehab PT Goals Patient Stated Goal: get back home and restart HH therapies PT Goal Formulation: With patient/family Time For Goal Achievement: 09/28/21 Potential to Achieve Goals: Good    Frequency Min 3X/week     Co-evaluation               AM-PAC PT "6 Clicks" Mobility  Outcome Measure Help needed turning from your back to your side while in a flat bed without using bedrails?: A Little Help needed moving from lying on your back to sitting on the side of a flat bed without using bedrails?: A Little Help needed moving to and from a bed to a chair (including a wheelchair)?: A Little Help needed standing up from a chair using your arms (e.g., wheelchair or bedside chair)?: A Little Help needed to walk in hospital room?:  A Little Help needed climbing 3-5 steps with a railing? : A Lot 6 Click Score: 17    End of Session Equipment Utilized During Treatment: Gait belt Activity Tolerance: Patient tolerated treatment well Patient left: in  chair;with call bell/phone within reach;with chair alarm set;with family/visitor present Nurse Communication: Mobility status PT Visit Diagnosis: Unsteadiness on feet (R26.81);Muscle weakness (generalized) (M62.81);Difficulty in walking, not elsewhere classified (R26.2)    Time: 6431-4276 PT Time Calculation (min) (ACUTE ONLY): 31 min   Charges:   PT Evaluation $PT Eval Low Complexity: 1 Low          Verner Mould, DPT Acute Rehabilitation Services Office [phone] Pager [phone]   Jacques Navy 09/14/2021, 1:42 PM

## 2021-09-14 NOTE — Progress Notes (Signed)
Pt's family heavily involved in care, was found to be administering pt's home insulin. Education given to pt's family by this nurse and MD, agreed to follow plan of care. Will continue to monitor and make oncoming nurse aware.

## 2021-09-14 NOTE — Progress Notes (Signed)
Called patient's daughter [name] and informed her of the patient's Covid positive test result.

## 2021-09-14 NOTE — Hospital Course (Signed)
[name] [name] is a 88 y.o. female with medical history significant of HTN, HLD, type II DM, PAD, hypothyroidism, recent diagnosis of dermatomyositis on steroids. Patient presented with complaints of a syncopal event.

## 2021-09-14 NOTE — Progress Notes (Signed)
Progress Note   Patient: [name] [name] KVQ:[id number] DOB: [birth year] DOA: 09/13/2021     Hospitalization day: 0 DOS: the patient was seen and examined on 09/14/2021   Brief hospital course: [name] [name] is a 88 y.o. female with medical history significant of HTN, HLD, type II DM, PAD, hypothyroidism, recent diagnosis of dermatomyositis on steroids. Patient presented with complaints of a syncopal event.  Assessment and Plan: * Syncope and collapse- (present on admission) Presents with an unresponsive event. Family actually provided CPR although patient had a pulse when EMS arrived.   CBG was 101 at the time of EMS evaluation patient had another episode of syncope while working with the EMS. At the time of my evaluation no focal deficit. Glucose 67 on admission.  Sodium 126.  EKG unremarkable.  CT head unremarkable.  Chest x-ray unremarkable. Family would like to discontinue EEG Patient was given IV hydration.  Currently on hold. Oral intake improving.  PT OT recommends home with home health.  Hyponatremia- (present on admission) Serum osmolarity normal.  Urine osmolality relatively normal.  Urine sodium relatively normal. Sodium did not improve with IV and normal saline infusion. Currently initiated on urea sodium tablets. Monitor BMP every 8 hours. Corrected sodium for hyperglycemia 128 last check.  Poor appetite- (present on admission) Hyponatremia Hypoglycemia Patient reportedly have poor p.o. intake in the setting of constipation as well as thrush. Continue nutritional supplements. Patient's long-acting insulin was on hold.  Constipation- (present on admission) Continue MiraLAX and Colace.[name] [name]- (present on admission) Nystatin ordered.  Improving. In the setting of prednisone use.  Uncontrolled diabetes mellitus with hypoglycemia without coma, with long-term current use of insulin (HCC) Hypoglycemic on arrival.  Now hyperglycemic. Changing dysphagia 3 diet to  dysphagia 3 diet carb modified. Hemoglobin A1c 8.8 on admission. Family concerned with regarding hyperglycemia and ended up giving patient her home Levemir 7 units at 10 AM and home NovoLog 2 units at 3:20 PM on 2/26. Discussed with the family regarding concerns with duplication of the medication. Family currently understand and agrees with the plan. Continue home Levemir 7 units in the morning and 3 units in the night. Continue sliding scale moderate for now.  Dermatopolymyositis, unspecified, organ involvement unspecified ([name])- (present on admission) Following up with [name] [name] rheumatology outpatient. Recent diagnosis. Patient is on prednisone taper.  We will continue.  Currently on 20 mg in the morning and 10 mg in the evening.  Plan is to transition to methotrexate outpatient which will be helpful.  Hypothyroidism, acquired- (present on admission) Continue Synthroid.  Normal TSH and normal free T4.  Essential hypertension- (present on admission) Blood pressure well controlled.  Will resume amlodipine tomorrow.  And resume losartan tonight.  Hyperlipidemia- (present on admission) Continue Lipitor.  GERD (gastroesophageal reflux disease)- (present on admission) Continue Pepcid.  COVID-19 virus infection Incidentally positive.  No prior history of COVID. Family reports that no one in the family has tested positive COVID on 2/26. Discussed with patient and verbalized understanding for starting her on Paxlovid based on the positivity.  Daughters would not like to continue Paxlovid and medicine is now discontinued. CT value 40. Will maintain isolation for now. Recommend family to monitor symptoms for next 4 to 10 days.  Patient will be eligible for initiating Paxlovid until 09/19/2021.  Sacral ulcer ([name])- (present on admission) Patient developed a rash which was diagnosed as dermatomyositis. Most of the rash is resolved but the rash on her bottom has persisted. Family is using  steroid cream for that. Does not appear infected. Wound care consulted.  Outpatient wound care pending recommended.  PAD (peripheral artery disease) ([name])- (present on admission) On aspirin.        Subjective: No nausea no vomiting no fever no chills.  Oral intake adequate.  Physical Exam: Vitals:   09/13/21 2046 09/13/21 2326 09/14/21 0415 09/14/21 1354  BP:  (!) 176/59 (!) 145/56 (!) 182/78  Pulse:  62 63 65  Resp:  16 16 16   Temp:  98.3 F (36.8 C) 97.6 F (36.4 C) 97.8 F (36.6 C)  TempSrc:  Oral Oral Oral  SpO2:  100% 100% 99%  Weight: 51.8 kg     Height: 5\' 7"  (1.702 m)      General: Appear in mild distress; no visible Abnormal Neck Mass Or lumps, Conjunctiva normal, thrush improving. Cardiovascular: S1 and S2 Present, no Murmur, Respiratory: good respiratory effort, Bilateral Air entry present and CTA, no Crackles, no wheezes Abdomen: Bowel Sound present Extremities: no Pedal edema Neurology: alert and oriented to place and person Gait not checked due to patient safety concerns   Data Reviewed:  I have Reviewed nursing notes, Vitals, and Lab results since pt's last encounter. Pertinent lab results CBC and BMP I have ordered test including BMP    Family Communication: Discussed with both daughter.  More than 30 minutes spent in conversation with daughter with regards to care plan.  Disposition: Status is: Observation  Time spent 55 minutes.  Author: Berle Mull, MD 09/14/2021 7:41 PM  For on call review [email or web address].

## 2021-09-14 NOTE — Consult Note (Signed)
[facility] Nurse Consult Note: Reason for Consult:scattered small full and partial thickness lesions on the buttocks Wound type: not known, not pressure Pressure Injury POA: N/A Measurement:Per Nursing Flow Sheet, two areas with lesions <1cm in diameter, with dried serum (scabbing)  Wound bed:See also photo taken on admission last evening Drainage (amount, consistency, odor) None Periwound: intact, dry Dressing procedure/placement/frequency: Patient is to be turned from side to side while in bed and time in the supine position minimized. Cleansing with the house skin care cleanser and gentle pat dry will be followed by an application of Desitin cream. This is to be applied three times daily and PRN episodes of incontinence or inadvertent removal. Bilateral heel pressure redistribution heel boots are also ordered.  [place] nursing team will not follow, but will remain available to this patient, the nursing and medical teams.  Please re-consult if needed. Thanks, Maudie Flakes, MSN, RN, [place], Arther Abbott  Pager# [phone]

## 2021-09-14 NOTE — Evaluation (Signed)
Occupational Therapy Evaluation Patient Details Name: [name] [name] MRN: [id number] DOB: Mar 24, 1934 Today's Date: 09/14/2021   History of Present Illness Patient is 88 y.o. female presented to [facility] after syncopal event when family found pt slumped over at breakfast and non-responsive, pt had a pulse, family  called 911 and were instructed to began chest compressions but pt then became alert. PMH significant for CVA, HTN, HLD, type II DM, PAD, hypothyroidism, recent diagnosis of dermatomyositis on steroids.   Clinical Impression   [name] [name] is an 88 year old woman admitted to hospital with above medical history. On evaluation she presents with generalized weakness, decreased activity tolerance, impaired balance and cognition. Patient is alert to self and is able to follow commands with cues and increased time. She is not alert to month, year, President or day of the week. She required min assist for sit to stands and ambulation due to posterior lean and unsteadiness. She needs assistance for all ADLs. Patient's daughter reports a decline in function since January 18 (she was living alone and ambulating with walker). Patient will benefit from skilled OT services while in hospital to improve deficits and learn compensatory strategies as needed in order to return to PLOF.  Patient has 24/7 assistance at home with family and recommend return to familiar environment.  Discussed POC with daughter who wants OT/PT home health.     Recommendations for follow up therapy are one component of a multi-disciplinary discharge planning process, led by the attending physician.  Recommendations may be updated based on patient status, additional functional criteria and insurance authorization.   Follow Up Recommendations  Home health OT    Assistance Recommended at Discharge Frequent or constant Supervision/Assistance  Patient can return home with the following A little help with walking and/or transfers;A  lot of help with bathing/dressing/bathroom;Assistance with cooking/housework;Assistance with feeding;Direct supervision/assist for medications management;Direct supervision/assist for financial management;Help with stairs or ramp for entrance;Assist for transportation    Functional Status Assessment     Equipment Recommendations  BSC/3in1    Recommendations for Other Services       Precautions / Restrictions Precautions Precautions: Fall Restrictions Weight Bearing Restrictions: No         Balance Overall balance assessment: Needs assistance Sitting-balance support: Feet supported, Bilateral upper extremity supported Sitting balance-Leahy Scale: Fair   Postural control: Posterior lean Standing balance support: Reliant on assistive device for balance, During functional activity, Bilateral upper extremity supported Standing balance-Leahy Scale: Poor                             ADL either performed or assessed with clinical judgement   ADL Overall ADL's : Needs assistance/impaired Eating/Feeding: Set up;Sitting;Supervision/ safety   Grooming: Set up;Sitting;Supervision/safety;Cueing for sequencing   Upper Body Bathing: Set up;Sitting;Minimal assistance;Cueing for sequencing   Lower Body Bathing: Moderate assistance;Sitting/lateral leans;Set up;Cueing for sequencing   Upper Body Dressing : Sitting;Moderate assistance;Cueing for sequencing   Lower Body Dressing: Maximal assistance;Sit to/from stand;Cueing for sequencing   Toilet Transfer: Minimal assistance;Regular Toilet;Rolling walker (2 wheels);Grab bars;Cueing for sequencing Toilet Transfer Details (indicate cue type and reason): verbal cues to use grab bar - assistance to control descent Toileting- Clothing Manipulation and Hygiene: Maximal assistance;Sit to/from stand Toileting - Clothing Manipulation Details (indicate cue type and reason): Patient able to wipe herself needs assistance for clothing  management and verbal cues     Functional mobility during ADLs: Minimal assistance;Rolling walker (2 wheels) General  ADL Comments: Min assist for steadying and use of walker. Verbal cues for all tasks. Ambulated to the bathroom and stood at sink wash her hands.      Vision   Vision Assessment?: No apparent visual deficits     Perception     Praxis      Pertinent Vitals/Pain Pain Assessment Pain Assessment: No/denies pain     Hand Dominance     Extremity/Trunk Assessment Upper Extremity Assessment Upper Extremity Assessment: Overall WFL for tasks assessed   Lower Extremity Assessment Lower Extremity Assessment: Defer to PT evaluation RLE Deficits / Details: 4-/5 for LE's grossly throughout. RLE Sensation: decreased proprioception LLE Deficits / Details: 4-/5 for LE's grossly throughout. LLE Sensation: decreased proprioception   Cervical / Trunk Assessment Cervical / Trunk Assessment: Kyphotic   Communication Communication Communication: No difficulties   Cognition Arousal/Alertness: Awake/alert Behavior During Therapy: WFL for tasks assessed/performed Overall Cognitive Status: Impaired/Different from baseline Area of Impairment: Orientation, Memory, Following commands, Problem solving, Safety/judgement, Awareness                 Orientation Level: Place, Time, Situation, Disoriented to   Memory: Decreased short-term memory Following Commands: Follows one step commands with increased time Safety/Judgement: Decreased awareness of deficits Awareness: Intellectual Problem Solving: Slow processing, Difficulty sequencing, Requires verbal cues General Comments: When asked it patient had dementia - daughter reports No. Patient is alert to self and hospital. She is wrong on the month and year (says 2020) and is unable to state the President's name. She needs verbal cueing for motor planning and all tasks.     General Comments       Exercises     Shoulder  Instructions      Home Living Family/patient expects to be discharged to:: Private residence Living Arrangements: Children Available Help at Discharge: Family Type of Home: House Home Access: Stairs to enter Technical brewer of Steps: 3+1 Entrance Stairs-Rails: None Home Layout: One level     Bathroom Shower/Tub: Occupational psychologist: Standard Bathroom Accessibility: Yes   Home Equipment: Conservation officer, nature (2 wheels);Rollator (4 wheels);Wheelchair - manual;Toilet riser   Additional Comments: pt satys with one daughter Mon-Thurs and then goes back to her own house where another daugther helps her.      Prior Functioning/Environment Prior Level of Function : Needs assist             Mobility Comments: pt using RW for transfers/gait with some use of wc if pt too weak to walk room-to-room. Daughters assist with transfers into shower. ADLs Comments: pt requires assist for bathing/dressing from daughters.        OT Problem List: Decreased activity tolerance;Impaired balance (sitting and/or standing);Decreased safety awareness;Decreased cognition;Decreased knowledge of use of DME or AE;Decreased strength      OT Treatment/Interventions: Self-care/ADL training;Therapeutic exercise;DME and/or AE instruction;Therapeutic activities;Balance training;Patient/family education    OT Goals(Current goals can be found in the care plan section) Acute Rehab OT Goals Patient Stated Goal: To walk more and help more OT Goal Formulation: With patient/family Time For Goal Achievement: 09/28/21 Potential to Achieve Goals: Fair  OT Frequency: Min 2X/week    Co-evaluation              AM-PAC OT "6 Clicks" Daily Activity     Outcome Measure Help from another person eating meals?: A Little Help from another person taking care of personal grooming?: A Little Help from another person toileting, which includes using toliet, bedpan, or urinal?: A  Lot Help from another person  bathing (including washing, rinsing, drying)?: A Lot Help from another person to put on and taking off regular upper body clothing?: A Lot Help from another person to put on and taking off regular lower body clothing?: A Lot 6 Click Score: 14   End of Session Equipment Utilized During Treatment: Gait belt;Rolling walker (2 wheels) Nurse Communication: Mobility status  Activity Tolerance: Patient tolerated treatment well Patient left: in chair;with call bell/phone within reach;with chair alarm set;with family/visitor present  OT Visit Diagnosis: Unsteadiness on feet (R26.81);Muscle weakness (generalized) (M62.81)                Time: 1610-9604 OT Time Calculation (min): 19 min Charges:  OT General Charges $OT Visit: 1 Visit OT Evaluation $OT Eval Low Complexity: 1 Low  Vishaal Strollo, OTR/L [place]  Office [phone] Pager: [place] 09/14/2021, 1:51 PM

## 2021-09-15 ENCOUNTER — Observation Stay (HOSPITAL_COMMUNITY): Payer: Medicare HMO

## 2021-09-15 DIAGNOSIS — K5900 Constipation, unspecified: Secondary | ICD-10-CM | POA: Diagnosis not present

## 2021-09-15 DIAGNOSIS — U071 COVID-19: Secondary | ICD-10-CM | POA: Diagnosis not present

## 2021-09-15 DIAGNOSIS — E871 Hypo-osmolality and hyponatremia: Secondary | ICD-10-CM | POA: Diagnosis not present

## 2021-09-15 DIAGNOSIS — R55 Syncope and collapse: Secondary | ICD-10-CM | POA: Diagnosis not present

## 2021-09-15 DIAGNOSIS — I10 Essential (primary) hypertension: Secondary | ICD-10-CM | POA: Diagnosis not present

## 2021-09-15 LAB — BASIC METABOLIC PANEL
Anion gap: 4 — ABNORMAL LOW (ref 5–15)
Anion gap: 7 (ref 5–15)
Anion gap: 7 (ref 5–15)
Anion gap: 7 (ref 5–15)
Anion gap: 8 (ref 5–15)
BUN: 48 mg/dL — ABNORMAL HIGH (ref 8–23)
BUN: 36 mg/dL — ABNORMAL HIGH (ref 8–23)
BUN: 34 mg/dL — ABNORMAL HIGH (ref 8–23)
BUN: 37 mg/dL — ABNORMAL HIGH (ref 8–23)
BUN: 48 mg/dL — ABNORMAL HIGH (ref 8–23)
CO2: 31 mmol/L (ref 22–32)
CO2: 28 mmol/L (ref 22–32)
CO2: 27 mmol/L (ref 22–32)
CO2: 30 mmol/L (ref 22–32)
CO2: 29 mmol/L (ref 22–32)
Calcium: 9.1 mg/dL (ref 8.9–10.3)
Calcium: 9.4 mg/dL (ref 8.9–10.3)
Calcium: 9.3 mg/dL (ref 8.9–10.3)
Calcium: 9.2 mg/dL (ref 8.9–10.3)
Calcium: 9.6 mg/dL (ref 8.9–10.3)
Chloride: 89 mmol/L — ABNORMAL LOW (ref 98–111)
Chloride: 89 mmol/L — ABNORMAL LOW (ref 98–111)
Chloride: 89 mmol/L — ABNORMAL LOW (ref 98–111)
Chloride: 86 mmol/L — ABNORMAL LOW (ref 98–111)
Chloride: 88 mmol/L — ABNORMAL LOW (ref 98–111)
Creatinine, Ser: 0.73 mg/dL (ref 0.44–1.00)
Creatinine, Ser: 0.71 mg/dL (ref 0.44–1.00)
Creatinine, Ser: 0.79 mg/dL (ref 0.44–1.00)
Creatinine, Ser: 1.11 mg/dL — ABNORMAL HIGH (ref 0.44–1.00)
Creatinine, Ser: 1.52 mg/dL — ABNORMAL HIGH (ref 0.44–1.00)
GFR, Estimated: >60 mL/min (ref >60)
GFR, Estimated: >60 mL/min (ref >60)
GFR, Estimated: >60 mL/min (ref >60)
GFR, Estimated: 49 mL/min — ABNORMAL LOW (ref >60)
GFR, Estimated: 33 mL/min — ABNORMAL LOW (ref >60)
Glucose, Bld: 159 mg/dL — ABNORMAL HIGH (ref 70–99)
Glucose, Bld: 135 mg/dL — ABNORMAL HIGH (ref 70–99)
Glucose, Bld: 213 mg/dL — ABNORMAL HIGH (ref 70–99)
Glucose, Bld: 316 mg/dL — ABNORMAL HIGH (ref 70–99)
Glucose, Bld: 374 mg/dL — ABNORMAL HIGH (ref 70–99)
Potassium: 5.2 mmol/L — ABNORMAL HIGH (ref 3.5–5.1)
Potassium: 4.9 mmol/L (ref 3.5–5.1)
Potassium: 4.7 mmol/L (ref 3.5–5.1)
Potassium: 4.7 mmol/L (ref 3.5–5.1)
Potassium: 5 mmol/L (ref 3.5–5.1)
Sodium: 124 mmol/L — ABNORMAL LOW (ref 135–145)
Sodium: 124 mmol/L — ABNORMAL LOW (ref 135–145)
Sodium: 123 mmol/L — ABNORMAL LOW (ref 135–145)
Sodium: 123 mmol/L — ABNORMAL LOW (ref 135–145)
Sodium: 125 mmol/L — ABNORMAL LOW (ref 135–145)

## 2021-09-15 LAB — CBC
HCT: 29.9 % — ABNORMAL LOW (ref 36.0–46.0)
Hemoglobin: 10.6 g/dL — ABNORMAL LOW (ref 12.0–15.0)
MCH: 34.5 pg — ABNORMAL HIGH (ref 26.0–34.0)
MCHC: 35.5 g/dL (ref 30.0–36.0)
MCV: 97.4 fL (ref 80.0–100.0)
Platelets: 308 10*3/uL (ref 150–400)
RBC: 3.07 MIL/uL — ABNORMAL LOW (ref 3.87–5.11)
RDW: 14.8 % (ref 11.5–15.5)
WBC: 8.4 10*3/uL (ref 4.0–10.5)
nRBC: 0 % (ref 0.0–0.2)

## 2021-09-15 LAB — URINALYSIS, COMPLETE (UACMP) WITH MICROSCOPIC
Bilirubin Urine: NEGATIVE
Glucose, UA: 50 mg/dL — AB
Hgb urine dipstick: NEGATIVE
Ketones, ur: NEGATIVE mg/dL
Leukocytes,Ua: NEGATIVE
Nitrite: NEGATIVE
Protein, ur: 100 mg/dL — AB
Specific Gravity, Urine: 1.016 (ref 1.005–1.030)
pH: 6 (ref 5.0–8.0)

## 2021-09-15 LAB — NA AND K (SODIUM & POTASSIUM), RAND UR
Potassium Urine: 38 mmol/L
Sodium, Ur: 49 mmol/L

## 2021-09-15 LAB — OSMOLALITY: Osmolality: 291 mOsm/kg (ref 275–295)

## 2021-09-15 LAB — GLUCOSE, CAPILLARY
Glucose-Capillary: 143 mg/dL — ABNORMAL HIGH (ref 70–99)
Glucose-Capillary: 206 mg/dL — ABNORMAL HIGH (ref 70–99)
Glucose-Capillary: 369 mg/dL — ABNORMAL HIGH (ref 70–99)
Glucose-Capillary: 366 mg/dL — ABNORMAL HIGH (ref 70–99)

## 2021-09-15 LAB — CREATININE, URINE, RANDOM: Creatinine, Urine: 82.93 mg/dL

## 2021-09-15 LAB — OSMOLALITY, URINE: Osmolality, Ur: 554 mOsm/kg (ref 300–900)

## 2021-09-15 MED ORDER — ENOXAPARIN SODIUM 30 MG/0.3ML IJ SOSY: 30.0000 mg | PREFILLED_SYRINGE | INTRAMUSCULAR | Status: DC

## 2021-09-15 MED ORDER — INSULIN ASPART 100 UNIT/ML IJ SOLN
2.0000 [IU] | Freq: Three times a day (TID) | INTRAMUSCULAR | Status: DC
  Administered 2021-09-16: 2 [IU] via SUBCUTANEOUS

## 2021-09-15 MED ORDER — SENNOSIDES-DOCUSATE SODIUM 8.6-50 MG PO TABS
1.0000 | ORAL_TABLET | Freq: Two times a day (BID) | ORAL | Status: DC
  Administered 2021-09-15 – 2021-09-16 (×2): 1 via ORAL
  Filled 2021-09-15 (×2): qty 1

## 2021-09-15 MED ORDER — SODIUM CHLORIDE 1 G PO TABS
2.0000 g | ORAL_TABLET | Freq: Two times a day (BID) | ORAL | Status: DC
  Administered 2021-09-15 – 2021-09-16 (×3): 2 g via ORAL
  Filled 2021-09-15 (×3): qty 2

## 2021-09-15 MED ORDER — GLUCERNA SHAKE PO LIQD
237.0000 mL | Freq: Three times a day (TID) | ORAL | Status: DC
  Administered 2021-09-15 – 2021-09-16 (×4): 237 mL via ORAL
  Filled 2021-09-15 (×5): qty 237

## 2021-09-15 MED ORDER — ATORVASTATIN CALCIUM 40 MG PO TABS
40.0000 mg | ORAL_TABLET | Freq: Every day | ORAL | Status: DC
  Administered 2021-09-15: 40 mg via ORAL
  Filled 2021-09-15: qty 1

## 2021-09-15 NOTE — TOC Transition Note (Signed)
Transition of Care [facility]) - CM/SW Discharge Note   Patient Details  Name: [name] [name] MRN: [id number] Date of Birth: 18-Apr-1934  [facility] CM/SW Contact:  Ross Ludwig, LCSW Phone Number: 09/15/2021, 11:26 AM   Clinical Narrative:     CSW spoke to patient's daughter Edwin Cap, [phone] to discuss home health agencies.  Per daughter they have had [facility] in the past and would like to use them again.  CSW informed her that they are not in network she said that is fine, they would still like to use [facility].  CSW spoke to [place] at [place], and they will accept the referral.  CSW saw orders for 3 in 1, and asked if they need one she said no she does not think so, if they do, they will contact the PCP.  CSW asked if they had any other needs and she said no, patient will be going to daughter's house [place]. [place], [place], [id number].  CSW signing off, please reconsult if other social work needs arise.   Final next level of care: [place] Barriers to Discharge: Barriers Resolved   Patient Goals and CMS Choice Patient states their goals for this hospitalization and ongoing recovery are:: To return back home to her daughter's house. CMS Medicare.gov Compare Post Acute Care list provided to:: Patient Represenative (must comment) Choice offered to / list presented to : Adult Children  Discharge Placement                       Discharge Plan and Services                          HH Arranged: PT HH Agency: [place] Date [facility] Agency Contacted: 09/15/21 Time [employer]: [id number] Representative spoke with at [place]: Cindie  Social Determinants of Health ([place]) Interventions     Readmission Risk Interventions No flowsheet data found.

## 2021-09-15 NOTE — Consult Note (Signed)
[facility]  HISTORY AND PHYSICAL  [name] [name] is an 88 y.o. female.    Chief Complaint: near syncope  HPI: Pt is an 7F with a PMH sig for HTN, HLD, DM II, dermatomyositis on steroids, PAD, and hypothyroidism who is now seen in consultation at the request of Dr. Posey Pronto for eval and recs re; hyponatremia.  Pt was brought in by family for a several day history of constipation and poor PO intake.  Was on a dysphagia 3 diet after recently being seen by speech.  Was at the breakfast table eating, BS in the 90s.  Got SSI for sugar and then became unresponsive.  Couldn't get a pulse- did CPR although pt had a pulse when EMS arrived.  Brought to [facility].    Noted to have hyponatremia to 128.  Initially thought that this was true hypovolemic hyponatremia- given fluids but Na went down, then was given Ure-Na and then salt tabs.  Has been stabilizing out at 123 it appears.  New urine and serum osms are pending. In this setting we are asked to see.    Pt is COVID + as well.  Doesn't give a whole lot of history but states she's not in pain.    PMH: Past Medical History:  Diagnosis Date   Arthritis    "knees, legs" (06/10/2016)   Ascites    Chronic kidney disease    "related to my diabetes"   Chronic lower back pain    Diabetes mellitus without complication (HCC)    Eczema    GERD (gastroesophageal reflux disease)    High cholesterol    Hyperlipidemia    Hypertension    Hypothyroidism    OAB (overactive bladder)    Osteoporosis    Thyroid disease    Type II diabetes mellitus (HCC)    Urticaria    PSH: Past Surgical History:  Procedure Laterality Date   ABDOMINAL HYSTERECTOMY     KNEE ARTHROSCOPY     LAPAROTOMY N/A 12/03/2016   Procedure: EXPLORATORY LAPAROTOMY, LYSIS OF ADHESIONS;  Surgeon: Armandina Gemma, MD;  Location: [facility];  Service: General;  Laterality: N/A;   LOOP RECORDER INSERTION N/A 08/23/2019   Procedure: LOOP RECORDER INSERTION;  Surgeon: Evans Lance, MD;   Location: [facility];  Service: Cardiovascular;  Laterality: N/A;   vocal cord polyps      Past Medical History:  Diagnosis Date   Arthritis    "knees, legs" (06/10/2016)   Ascites    Chronic kidney disease    "related to my diabetes"   Chronic lower back pain    Diabetes mellitus without complication (HCC)    Eczema    GERD (gastroesophageal reflux disease)    High cholesterol    Hyperlipidemia    Hypertension    Hypothyroidism    OAB (overactive bladder)    Osteoporosis    Thyroid disease    Type II diabetes mellitus (HCC)    Urticaria     Medications:  Scheduled:  amLODipine  2.5 mg Oral Daily   atorvastatin  40 mg Oral QHS   docusate sodium  100 mg Oral BID   enoxaparin (LOVENOX) injection  40 mg Subcutaneous Q24H   feeding supplement (GLUCERNA SHAKE)  237 mL Oral TID BM   insulin aspart  0-15 Units Subcutaneous TID WC   insulin aspart  0-5 Units Subcutaneous QHS   insulin detemir  3 Units Subcutaneous QHS   insulin detemir  7 Units Subcutaneous Daily  levETIRAcetam  500 mg Oral BID   levothyroxine  200 mcg Oral Q0600   liver oil-zinc oxide   Topical TID   nystatin  5 mL Oral QID   polyethylene glycol  17 g Oral Daily   predniSONE  10 mg Oral QPM   predniSONE  20 mg Oral Q breakfast   sodium chloride  2 g Oral BID WC    Medications Prior to Admission  Medication Sig Dispense Refill   acetaminophen (TYLENOL) 650 MG CR tablet Take 1,300 mg by mouth at bedtime as needed for pain.     amLODipine (NORVASC) 2.5 MG tablet Take 2.5 mg by mouth in the morning.     atorvastatin (LIPITOR) 40 MG tablet Take 40 mg by mouth at bedtime.     BAYER LOW DOSE 81 MG EC tablet Take 81 mg by mouth in the morning. Swallow whole.     cetirizine (ZYRTEC) 10 MG tablet Take 10 mg by mouth daily as needed for allergies.     Cholecalciferol (VITAMIN D-3 PO) Take 10,000 Units by mouth in the morning.     Famotidine (PEPCID AC PO) Take 1 tablet by mouth at bedtime.      fluticasone (FLONASE) 50 MCG/ACT nasal spray Place 1-2 sprays into both nostrils daily as needed for allergies or rhinitis.     insulin aspart (NOVOLOG FLEXPEN) 100 UNIT/ML FlexPen 2-10 Units See admin instructions. Inject 2-10 units into the skin three times a day with meals, per sliding scale:  Breakfast: BGL 80-199 = 8 units; 200-299 = 9 units; 300 or greater = 10 units Lunch: BGL 80-199 = 5 units; 200-299 = 6 units; 300 or greater = 7 units Supper/evening meal: BGL 80-199 = 2 units; 200-299 = 3 units; 300 or greater = 4 units     insulin detemir (LEVEMIR FLEXTOUCH) 100 UNIT/ML FlexPen 3-7 Units See admin instructions. Inject 7 units into the skin in the morning and 3 units at bedtime if BGL is 200 or below and 4 units if BGL is greater than 200     Iron-FA-B Cmp-C-Biot-Probiotic (FUSION PLUS) CAPS Take 1 capsule by mouth daily with breakfast.     levETIRAcetam (KEPPRA) 500 MG tablet Take 1 tablet (500 mg total) by mouth 2 (two) times daily. (Patient taking differently: Take 500 mg by mouth in the morning and at bedtime.) 60 tablet 2   losartan (COZAAR) 50 MG tablet Take 50 mg by mouth at bedtime.     Multiple Vitamins-Calcium (ONE-A-DAY WOMENS FORMULA) TABS Take 1 tablet by mouth daily after breakfast.     NON FORMULARY Take 1 capsule by mouth See admin instructions. Fish oil 1,000 mg/Omega-3 300 mg capsules- Take 1 capsule by mouth every morning and at bedtime     predniSONE (DELTASONE) 20 MG tablet Take 10-20 mg by mouth See admin instructions. Take 20 mg by mouth in the morning and 10 mg at bedtime- through 09/16/2021. Decrease to 10 mg by mouth in the morning and at bedtime as of 09/17/2021. Further directions to be provided by MD [facility].     SYNTHROID 200 MCG tablet Take 200 mcg by mouth daily before breakfast.     azelastine (ASTELIN) 0.1 % nasal spray Place 2 sprays into both nostrils 2 (two) times daily. Use in each nostril as directed (Patient not taking: Reported on 09/13/2021) 30 mL 2    B-D UF III MINI PEN NEEDLES 31G X 5 MM MISC USE AS DIRECTED TID     famotidine (PEPCID)  20 MG tablet Take 20 mg by mouth at bedtime as needed for heartburn or indigestion. (Patient not taking: Reported on 09/13/2021)     HYDROcodone-acetaminophen (NORCO/VICODIN) 5-325 MG tablet Take 1 tablet by mouth every 6 (six) hours as needed. (Patient not taking: Reported on 09/13/2021) 10 tablet 0   mometasone (NASONEX) 50 MCG/ACT nasal spray Place 2 sprays into the nose daily as needed (for allergies or rhinitis- when not using Astelin). (Patient not taking: Reported on 09/13/2021)     montelukast (SINGULAIR) 10 MG tablet Take 1 tablet (10 mg total) by mouth at bedtime. (Patient not taking: Reported on 09/13/2021) 30 tablet 5   OneTouch Delica Lancets 59D MISC IC-10 CODE:  E11.65  Check blood sugar     ONETOUCH VERIO test strip 1 each 3 (three) times daily.     terconazole (TERAZOL 3) 0.8 % vaginal cream Place 1 applicator vaginally at bedtime. (Patient not taking: Reported on 09/13/2021) 20 g 0    ALLERGIES:   Allergies  Allergen Reactions   Nsaids Other (See Comments)    No nsaids due to kidney function!!!! Especially ibuprofen or Aleve.   Biaxin [Clarithromycin] Hives   Sulfa Antibiotics Hives and Other (See Comments)    NO sulfa-based meds!!   Bactrim [Sulfamethoxazole-Trimethoprim] Hives and Rash   Lisinopril Cough    FAM HX: Family History  Problem Relation Age of Onset   Diabetes Mother    Hypertension Mother    Asthma Father    Diabetes Sister    Stomach cancer Sister    Diabetes Brother    Allergic rhinitis Neg Hx    Eczema Neg Hx    Urticaria Neg Hx     Social History:   reports that she has never smoked. She quit smokeless tobacco use about 38 years ago.  Her smokeless tobacco use included chew. She reports that she does not drink alcohol and does not use drugs.  ROS: ROS: all other systems reviewed and are negative except as per HPI  Blood pressure (!) 142/69, pulse 70,  temperature 98 F (36.7 C), temperature source Oral, resp. rate 16, height 5\' 7"  (1.702 m), weight 51.8 kg, SpO2 100 %. PHYSICAL EXAM: Physical Exam GEN NAD, sitting in chair HEENT EOMI PERRL NECK no JVD PULM clear CV RRR ABD soft EXT trace LLE edema NEURO alert, not able to carry on a complicated conversation SKIN: + erosions   Results for orders placed or performed during the hospital encounter of 09/13/21 (from the past 48 hour(s))  Urinalysis, Routine w reflex microscopic Urine, Unspecified Source     Status: Abnormal   Collection Time: 09/13/21  5:10 PM  Result Value Ref Range   Color, Urine YELLOW YELLOW   APPearance HAZY (A) CLEAR   Specific Gravity, Urine 1.008 1.005 - 1.030   pH 7.0 5.0 - 8.0   Glucose, UA NEGATIVE NEGATIVE mg/dL   Hgb urine dipstick NEGATIVE NEGATIVE   Bilirubin Urine NEGATIVE NEGATIVE   Ketones, ur NEGATIVE NEGATIVE mg/dL   Protein, ur 100 (A) NEGATIVE mg/dL   Nitrite NEGATIVE NEGATIVE   Leukocytes,Ua NEGATIVE NEGATIVE   RBC / HPF 0-5 0 - 5 RBC/hpf   WBC, UA 0-5 0 - 5 WBC/hpf   Bacteria, UA NONE SEEN NONE SEEN   Squamous Epithelial / LPF 0-5 0 - 5    Comment: Performed at [facility], [place] [address], [place], [place] [address]  Na and K (sodium & potassium), rand urine     Status:  None   Collection Time: 09/13/21  5:10 PM  Result Value Ref Range   Sodium, Ur 70 mmol/L   Potassium Urine 12 mmol/L    Comment: Performed at [facility], [place] [address], [place], [place] [address]  Creatinine, urine, random     Status: None   Collection Time: 09/13/21  5:10 PM  Result Value Ref Range   Creatinine, Urine 28.10 mg/dL    Comment: Performed at [facility], [place] [address], [place], [place] [address]  Osmolality, urine     Status: Abnormal   Collection Time: 09/13/21  5:10 PM  Result Value Ref Range   Osmolality, Ur 287 (L) 300 - 900 mOsm/kg    Comment: PERFORMED AT [facility] Performed at [facility], [place] [address], [place], [place] [address]   Resp Panel by RT-PCR (Flu A&B, Covid) Nasopharyngeal Swab     Status: Abnormal   Collection Time: 09/13/21  5:56 PM   Specimen: Nasopharyngeal Swab; Nasopharyngeal(NP) swabs in vial transport medium  Result Value Ref Range   SARS Coronavirus 2 by RT PCR POSITIVE (A) NEGATIVE    Comment: (NOTE) SARS-CoV-2 target nucleic acids are DETECTED.  The SARS-CoV-2 RNA is generally detectable in upper respiratory specimens during the acute phase of infection. Positive results are indicative of the presence of the identified virus, but do not rule out bacterial infection or co-infection with other pathogens not detected by the test. Clinical correlation with patient history and other diagnostic information is necessary to determine patient infection status. The expected result is Negative.  Fact Sheet for Patients: EntrepreneurPulse.com.au  Fact Sheet for Healthcare Providers: IncredibleEmployment.be  This test is not yet approved or cleared by the [place] FDA and  has been authorized for detection and/or diagnosis of SARS-CoV-2 by FDA under an Emergency Use Authorization (EUA).  This EUA will remain in effect (meaning this test can be used) for the duration of  the COVID-19 declaration under Section 564(b)(1) of the A ct, 21 U.S.C. section 360bbb-3(b)(1), unless the authorization is terminated or revoked sooner.     Influenza A by PCR NEGATIVE NEGATIVE   Influenza B by PCR NEGATIVE NEGATIVE    Comment: (NOTE) The Xpert Xpress SARS-CoV-2/FLU/RSV plus assay is intended as an aid in the diagnosis of influenza from Nasopharyngeal swab specimens and should not be used as a sole basis for treatment. Nasal washings and aspirates are unacceptable for Xpert Xpress SARS-CoV-2/FLU/RSV testing.  Fact Sheet for Patients: EntrepreneurPulse.com.au  Fact  Sheet for Healthcare Providers: IncredibleEmployment.be  This test is not yet approved or cleared by the [place] FDA and has been authorized for detection and/or diagnosis of SARS-CoV-2 by FDA under an Emergency Use Authorization (EUA). This EUA will remain in effect (meaning this test can be used) for the duration of the COVID-19 declaration under Section 564(b)(1) of the Act, 21 U.S.C. section 360bbb-3(b)(1), unless the authorization is terminated or revoked.  Performed at [facility], [place] [address], [place], [place] [address]   CBG monitoring, ED     Status: Abnormal   Collection Time: 09/13/21  7:51 PM  Result Value Ref Range   Glucose-Capillary 146 (H) 70 - 99 mg/dL    Comment: Glucose reference range applies only to samples taken after fasting for at least 8 hours.   Comment 1 Notify RN   Osmolality     Status: None   Collection Time: 09/13/21  8:43 PM  Result Value Ref Range  Osmolality 278 275 - 295 mOsm/kg    Comment: PERFORMED AT [facility] Performed at [facility], [place] [address], [place], [place] [address]   TSH     Status: None   Collection Time: 09/13/21  8:43 PM  Result Value Ref Range   TSH 1.566 0.350 - 4.500 uIU/mL    Comment: Performed by a 3rd Generation assay with a functional sensitivity of <=0.01 uIU/mL. Performed at [facility], [place] [address] [place], [place] [address]   T4, free     Status: Abnormal   Collection Time: 09/13/21  8:43 PM  Result Value Ref Range   Free T4 1.78 (H) 0.61 - 1.12 ng/dL    Comment: (NOTE) Biotin ingestion may interfere with free T4 tests. If the results are inconsistent with the TSH level, previous test results, or the clinical presentation, then consider biotin interference. If needed, order repeat testing after stopping biotin. Performed at [facility], [place] [address], [place], [place] [address]   Basic metabolic panel      Status: Abnormal   Collection Time: 09/13/21  8:43 PM  Result Value Ref Range   Sodium 126 (L) 135 - 145 mmol/L   Potassium 4.8 3.5 - 5.1 mmol/L   Chloride 89 (L) 98 - 111 mmol/L   CO2 29 22 - 32 mmol/L   Glucose, Bld 138 (H) 70 - 99 mg/dL    Comment: Glucose reference range applies only to samples taken after fasting for at least 8 hours.   BUN 20 8 - 23 mg/dL   Creatinine, Ser 0.83 0.44 - 1.00 mg/dL   Calcium 9.8 8.9 - 10.3 mg/dL   GFR, Estimated >60 >60 mL/min    Comment: (NOTE) Calculated using the CKD-EPI Creatinine Equation (2021)    Anion gap 8 5 - 15    Comment: Performed at [facility], [place] [address], [place], [place] [address]  Basic metabolic panel     Status: Abnormal   Collection Time: 09/13/21 11:56 PM  Result Value Ref Range   Sodium 127 (L) 135 - 145 mmol/L   Potassium 4.6 3.5 - 5.1 mmol/L   Chloride 91 (L) 98 - 111 mmol/L   CO2 28 22 - 32 mmol/L   Glucose, Bld 129 (H) 70 - 99 mg/dL    Comment: Glucose reference range applies only to samples taken after fasting for at least 8 hours.   BUN 18 8 - 23 mg/dL   Creatinine, Ser 0.69 0.44 - 1.00 mg/dL   Calcium 9.4 8.9 - 10.3 mg/dL   GFR, Estimated >60 >60 mL/min    Comment: (NOTE) Calculated using the CKD-EPI Creatinine Equation (2021)    Anion gap 8 5 - 15    Comment: Performed at [facility], [place] [address], [place], [place] [address]  Basic metabolic panel     Status: Abnormal   Collection Time: 09/14/21  5:43 AM  Result Value Ref Range   Sodium 126 (L) 135 - 145 mmol/L   Potassium 4.7 3.5 - 5.1 mmol/L   Chloride 92 (L) 98 - 111 mmol/L   CO2 28 22 - 32 mmol/L   Glucose, Bld 192 (H) 70 - 99 mg/dL    Comment: Glucose reference range applies only to samples taken after fasting for at least 8 hours.   BUN 16 8 - 23 mg/dL   Creatinine, Ser 0.67 0.44 - 1.00 mg/dL   Calcium 8.8 (L) 8.9 - 10.3 mg/dL  GFR, Estimated >60 >60 mL/min    Comment: (NOTE) Calculated using the  CKD-EPI Creatinine Equation (2021)    Anion gap 6 5 - 15    Comment: Performed at [facility], [place] [address] [place], [place] [address]  Glucose, capillary     Status: Abnormal   Collection Time: 09/14/21  7:52 AM  Result Value Ref Range   Glucose-Capillary 250 (H) 70 - 99 mg/dL    Comment: Glucose reference range applies only to samples taken after fasting for at least 8 hours.  Basic metabolic panel     Status: Abnormal   Collection Time: 09/14/21  8:46 AM  Result Value Ref Range   Sodium 128 (L) 135 - 145 mmol/L   Potassium 4.8 3.5 - 5.1 mmol/L   Chloride 95 (L) 98 - 111 mmol/L   CO2 25 22 - 32 mmol/L   Glucose, Bld 242 (H) 70 - 99 mg/dL    Comment: Glucose reference range applies only to samples taken after fasting for at least 8 hours.   BUN 16 8 - 23 mg/dL   Creatinine, Ser 0.53 0.44 - 1.00 mg/dL   Calcium 8.7 (L) 8.9 - 10.3 mg/dL   GFR, Estimated >60 >60 mL/min    Comment: (NOTE) Calculated using the CKD-EPI Creatinine Equation (2021)    Anion gap 8 5 - 15    Comment: Performed at [facility], [place] [address] [place], [place] [address]  CBC     Status: Abnormal   Collection Time: 09/14/21  8:46 AM  Result Value Ref Range   WBC 7.7 4.0 - 10.5 K/uL   RBC 3.31 (L) 3.87 - 5.11 MIL/uL   Hemoglobin 11.3 (L) 12.0 - 15.0 g/dL   HCT 33.1 (L) 36.0 - 46.0 %   MCV 100.0 80.0 - 100.0 fL    Comment: REPEATED TO VERIFY DELTA CHECK NOTED CONFIRMED WITH NEW SAMPLE    MCH 34.1 (H) 26.0 - 34.0 pg   MCHC 34.1 30.0 - 36.0 g/dL   RDW 15.1 11.5 - 15.5 %   Platelets 293 150 - 400 K/uL   nRBC 0.0 0.0 - 0.2 %    Comment: Performed at [facility], [place] [address], [place], [place] [address]  Resp Panel by RT-PCR (Flu A&B, Covid) Nasopharyngeal Swab     Status: Abnormal   Collection Time: 09/14/21 10:50 AM   Specimen: Nasopharyngeal Swab; Nasopharyngeal(NP) swabs in vial transport medium  Result Value Ref Range   SARS  Coronavirus 2 by RT PCR POSITIVE (A) NEGATIVE    Comment: (NOTE) SARS-CoV-2 target nucleic acids are DETECTED.  The SARS-CoV-2 RNA is generally detectable in upper respiratory specimens during the acute phase of infection. Positive results are indicative of the presence of the identified virus, but do not rule out bacterial infection or co-infection with other pathogens not detected by the test. Clinical correlation with patient history and other diagnostic information is necessary to determine patient infection status. The expected result is Negative.  Fact Sheet for Patients: EntrepreneurPulse.com.au  Fact Sheet for Healthcare Providers: IncredibleEmployment.be  This test is not yet approved or cleared by the [place] FDA and  has been authorized for detection and/or diagnosis of SARS-CoV-2 by FDA under an Emergency Use Authorization (EUA).  This EUA will remain in effect (meaning this test can be used) for the duration of  the COVID-19 declaration under Section 564(b)(1) of the A ct, 21 U.S.C. section 360bbb-3(b)(1), unless the authorization is terminated or  revoked sooner.     Influenza A by PCR NEGATIVE NEGATIVE   Influenza B by PCR NEGATIVE NEGATIVE    Comment: (NOTE) The Xpert Xpress SARS-CoV-2/FLU/RSV plus assay is intended as an aid in the diagnosis of influenza from Nasopharyngeal swab specimens and should not be used as a sole basis for treatment. Nasal washings and aspirates are unacceptable for Xpert Xpress SARS-CoV-2/FLU/RSV testing.  Fact Sheet for Patients: EntrepreneurPulse.com.au  Fact Sheet for Healthcare Providers: IncredibleEmployment.be  This test is not yet approved or cleared by the [place] FDA and has been authorized for detection and/or diagnosis of SARS-CoV-2 by FDA under an Emergency Use Authorization (EUA). This EUA will remain in effect (meaning this test can be  used) for the duration of the COVID-19 declaration under Section 564(b)(1) of the Act, 21 U.S.C. section 360bbb-3(b)(1), unless the authorization is terminated or revoked.  Performed at [facility], [place] [address], [place], [place] [address]   Glucose, capillary     Status: Abnormal   Collection Time: 09/14/21 11:23 AM  Result Value Ref Range   Glucose-Capillary 297 (H) 70 - 99 mg/dL    Comment: Glucose reference range applies only to samples taken after fasting for at least 8 hours.  Basic metabolic panel     Status: Abnormal   Collection Time: 09/14/21  3:05 PM  Result Value Ref Range   Sodium 124 (L) 135 - 145 mmol/L   Potassium 4.8 3.5 - 5.1 mmol/L   Chloride 92 (L) 98 - 111 mmol/L   CO2 23 22 - 32 mmol/L   Glucose, Bld 327 (H) 70 - 99 mg/dL    Comment: Glucose reference range applies only to samples taken after fasting for at least 8 hours.   BUN 28 (H) 8 - 23 mg/dL   Creatinine, Ser 0.68 0.44 - 1.00 mg/dL   Calcium 9.0 8.9 - 10.3 mg/dL   GFR, Estimated >60 >60 mL/min    Comment: (NOTE) Calculated using the CKD-EPI Creatinine Equation (2021)    Anion gap 9 5 - 15    Comment: Performed at [facility], [place] [address] [place], [place] [address]  Glucose, capillary     Status: Abnormal   Collection Time: 09/14/21  4:18 PM  Result Value Ref Range   Glucose-Capillary 301 (H) 70 - 99 mg/dL    Comment: Glucose reference range applies only to samples taken after fasting for at least 8 hours.  Glucose, capillary     Status: Abnormal   Collection Time: 09/14/21  6:06 PM  Result Value Ref Range   Glucose-Capillary 257 (H) 70 - 99 mg/dL    Comment: Glucose reference range applies only to samples taken after fasting for at least 8 hours.  Glucose, capillary     Status: Abnormal   Collection Time: 09/14/21  8:45 PM  Result Value Ref Range   Glucose-Capillary 248 (H) 70 - 99 mg/dL    Comment: Glucose reference range applies only to samples  taken after fasting for at least 8 hours.  Basic metabolic panel     Status: Abnormal   Collection Time: 09/15/21 12:31 AM  Result Value Ref Range   Sodium 124 (L) 135 - 145 mmol/L   Potassium 5.2 (H) 3.5 - 5.1 mmol/L   Chloride 89 (L) 98 - 111 mmol/L   CO2 31 22 - 32 mmol/L   Glucose, Bld 159 (H) 70 - 99 mg/dL    Comment: Glucose reference range applies only to samples taken  after fasting for at least 8 hours.   BUN 48 (H) 8 - 23 mg/dL   Creatinine, Ser 0.73 0.44 - 1.00 mg/dL   Calcium 9.1 8.9 - 10.3 mg/dL   GFR, Estimated >60 >60 mL/min    Comment: (NOTE) Calculated using the CKD-EPI Creatinine Equation (2021)    Anion gap 4 (L) 5 - 15    Comment: Performed at [facility], [place] [address] [place], [place] [address]  CBC     Status: Abnormal   Collection Time: 09/15/21 12:31 AM  Result Value Ref Range   WBC 8.4 4.0 - 10.5 K/uL   RBC 3.07 (L) 3.87 - 5.11 MIL/uL   Hemoglobin 10.6 (L) 12.0 - 15.0 g/dL   HCT 29.9 (L) 36.0 - 46.0 %   MCV 97.4 80.0 - 100.0 fL   MCH 34.5 (H) 26.0 - 34.0 pg   MCHC 35.5 30.0 - 36.0 g/dL   RDW 14.8 11.5 - 15.5 %   Platelets 308 150 - 400 K/uL   nRBC 0.0 0.0 - 0.2 %    Comment: Performed at [facility], [place] [address] [place], [place] [address]  Glucose, capillary     Status: Abnormal   Collection Time: 09/15/21  7:59 AM  Result Value Ref Range   Glucose-Capillary 143 (H) 70 - 99 mg/dL    Comment: Glucose reference range applies only to samples taken after fasting for at least 8 hours.  Basic metabolic panel     Status: Abnormal   Collection Time: 09/15/21  8:06 AM  Result Value Ref Range   Sodium 124 (L) 135 - 145 mmol/L   Potassium 4.9 3.5 - 5.1 mmol/L   Chloride 89 (L) 98 - 111 mmol/L   CO2 28 22 - 32 mmol/L   Glucose, Bld 135 (H) 70 - 99 mg/dL    Comment: Glucose reference range applies only to samples taken after fasting for at least 8 hours.   BUN 36 (H) 8 - 23 mg/dL   Creatinine, Ser 0.71 0.44 -  1.00 mg/dL   Calcium 9.4 8.9 - 10.3 mg/dL   GFR, Estimated >60 >60 mL/min    Comment: (NOTE) Calculated using the CKD-EPI Creatinine Equation (2021)    Anion gap 7 5 - 15    Comment: Performed at [facility], [place] [address] [place], [place] [address]  Glucose, capillary     Status: Abnormal   Collection Time: 09/15/21 11:58 AM  Result Value Ref Range   Glucose-Capillary 206 (H) 70 - 99 mg/dL    Comment: Glucose reference range applies only to samples taken after fasting for at least 8 hours.  Basic metabolic panel     Status: Abnormal   Collection Time: 09/15/21  1:09 PM  Result Value Ref Range   Sodium 123 (L) 135 - 145 mmol/L   Potassium 4.7 3.5 - 5.1 mmol/L   Chloride 89 (L) 98 - 111 mmol/L   CO2 27 22 - 32 mmol/L   Glucose, Bld 213 (H) 70 - 99 mg/dL    Comment: Glucose reference range applies only to samples taken after fasting for at least 8 hours.   BUN 34 (H) 8 - 23 mg/dL   Creatinine, Ser 0.79 0.44 - 1.00 mg/dL   Calcium 9.3 8.9 - 10.3 mg/dL   GFR, Estimated >60 >60 mL/min    Comment: (NOTE) Calculated using the CKD-EPI Creatinine Equation (2021)    Anion gap 7 5 - 15  Comment: Performed at [facility], [place] [address] [place], [place] [address]  Basic metabolic panel     Status: Abnormal   Collection Time: 09/15/21  3:02 PM  Result Value Ref Range   Sodium 123 (L) 135 - 145 mmol/L   Potassium 4.7 3.5 - 5.1 mmol/L   Chloride 86 (L) 98 - 111 mmol/L   CO2 30 22 - 32 mmol/L   Glucose, Bld 316 (H) 70 - 99 mg/dL    Comment: Glucose reference range applies only to samples taken after fasting for at least 8 hours.   BUN 37 (H) 8 - 23 mg/dL   Creatinine, Ser 1.11 (H) 0.44 - 1.00 mg/dL   Calcium 9.2 8.9 - 10.3 mg/dL   GFR, Estimated 49 (L) >60 mL/min    Comment: (NOTE) Calculated using the CKD-EPI Creatinine Equation (2021)    Anion gap 7 5 - 15    Comment: Performed at [facility], [place] [address]  [place], [place] [address]    DG Abd Portable 1V  Result Date: 09/15/2021 CLINICAL DATA:  Constipation EXAM: PORTABLE ABDOMEN - 1 VIEW COMPARISON:  None. FINDINGS: Portable supine exam with some degree of rotation. Grossly normal appearing colonic stool burden. No significant dilatation pattern, ileus or obstruction. Aortoiliac vascular calcifications noted. Degenerative changes of the lumbosacral spine. No acute osseous finding. IMPRESSION: Normal bowel gas pattern as above. Aortoiliac atherosclerosis Electronically Signed   By: Jerilynn Mages.  Shick M.D.   On: 09/15/2021 11:03    Assessment/Plan   Hyponatremia: I suspect a mixed picture of hypovolemic hyponatremia and SIADH.  Will fluid restrict presently and will follow results of osms.  Agree with stopping Cozaar as that can impair free water excretion and exacerbate things.  I'll keep the salt tabs on for now but may end up not needing them COVID- incidental finding.  Family has elected for no paxlovid HTN: on amlodipine in place of losartan Dermatomyositis: on steroids Dispo: inpatient  Madelon Lips 09/15/2021, 4:32 PM

## 2021-09-15 NOTE — Assessment & Plan Note (Addendum)
Baseline serum creatinine around 0.7.  Serum creatinine on 2/27 worsened to 1.1.  Meeting AKI criteria.  Patient does not appear dehydrated.  Creatinine improved.  Outpatient follow-up.

## 2021-09-15 NOTE — Progress Notes (Signed)
Inpatient Diabetes Program Recommendations  AACE/ADA: New Consensus Statement on Inpatient Glycemic Control (2015)  Target Ranges:  Prepandial:   less than 140 mg/dL      Peak postprandial:   less than 180 mg/dL (1-2 hours)      Critically ill patients:  140 - 180 mg/dL   Lab Results  Component Value Date   GLUCAP 206 (H) 09/15/2021   HGBA1C 8.8 (H) 09/13/2021    Review of Glycemic Control  Diabetes history: DM2  Current orders for Inpatient glycemic control: Semglee 7 units in am and 3 units QHS, Novolog 0-15 TID with meals and 0-5 HS Prednisone 20 units in am and 10 units QHS  Inpatient Diabetes Program Recommendations:    Consider adding Novolog 2 units TID for meal coverage insulin if eating > 50%.  Continue to follow glucose trends.   Thank you. Lorenda Peck, RD, LDN, CDE Inpatient Diabetes Coordinator [phone]

## 2021-09-15 NOTE — Plan of Care (Signed)
No acute events overnight. Problem: Education: Goal: Knowledge of General Education information will improve Description: Including pain rating scale, medication(s)/side effects and non-pharmacologic comfort measures Outcome: Progressing   Problem: Health Behavior/Discharge Planning: Goal: Ability to manage health-related needs will improve Outcome: Progressing   Problem: Clinical Measurements: Goal: Ability to maintain clinical measurements within normal limits will improve Outcome: Progressing Goal: Will remain free from infection Outcome: Progressing Goal: Diagnostic test results will improve Outcome: Progressing Goal: Cardiovascular complication will be avoided Outcome: Progressing   Problem: Activity: Goal: Risk for activity intolerance will decrease Outcome: Progressing   Problem: Nutrition: Goal: Adequate nutrition will be maintained Outcome: Progressing   Problem: Coping: Goal: Level of anxiety will decrease Outcome: Progressing   Problem: Elimination: Goal: Will not experience complications related to bowel motility Outcome: Progressing Goal: Will not experience complications related to urinary retention Outcome: Progressing   Problem: Pain Managment: Goal: General experience of comfort will improve Outcome: Progressing   Problem: Safety: Goal: Ability to remain free from injury will improve Outcome: Progressing   Problem: Skin Integrity: Goal: Risk for impaired skin integrity will decrease Outcome: Progressing

## 2021-09-15 NOTE — Progress Notes (Signed)
Physical Therapy Treatment Patient Details Name: [name] [name] MRN: [id number] DOB: 06/25/1934 Today's Date: 09/15/2021   History of Present Illness Patient is 88 y.o. female presented to [facility] after syncopal event when family found pt slumped over at breakfast and non-responsive, pt had a pulse, family  called 911 and were instructed to began chest compressions but pt then became alert. PMH significant for CVA, HTN, HLD, type II DM, PAD, hypothyroidism, recent diagnosis of dermatomyositis on steroids.    PT Comments    Patient making steady progress and agreeable to mobility. Pt required min-mod assist for sit<>stands from varying surfaces and balance with gait improved with HHA vs RW as pt has tendency to flex and lean posterior with walker. HR elevated to max of 147 bpm this session and RN notified, pt asymptomatic and BP 121/72 mmHg. Acute PT will continue to progress mobility as able.    Recommendations for follow up therapy are one component of a multi-disciplinary discharge planning process, led by the attending physician.  Recommendations may be updated based on patient status, additional functional criteria and insurance authorization.  Follow Up Recommendations  Home health PT     Assistance Recommended at Discharge Frequent or constant Supervision/Assistance  Patient can return home with the following A little help with walking and/or transfers;A little help with bathing/dressing/bathroom;Assistance with cooking/housework;Direct supervision/assist for medications management;Assist for transportation;Help with stairs or ramp for entrance   Equipment Recommendations  None recommended by PT    Recommendations for Other Services       Precautions / Restrictions Precautions Precautions: Fall Restrictions Weight Bearing Restrictions: No     Mobility  Bed Mobility               General bed mobility comments: pt OOB in recliner    Transfers Overall transfer level:  Needs assistance Equipment used: Rolling walker (2 wheels), 1 person hand held assist, 2 person hand held assist Transfers: Sit to/from Stand Sit to Stand: Min assist, Mod assist           General transfer comment: min-mod +2 for safety. pt with slight posterior lean when rising with RW vs with HHA. pt required increased assist to rise from toilet vs recliner.    Ambulation/Gait Ambulation/Gait assistance: Mod assist, Min assist Gait Distance (Feet): 40 Feet (2x20) Assistive device: Rolling walker (2 wheels), 1 person hand held assist, 2 person hand held assist Gait Pattern/deviations: Step-through pattern, Decreased stride length, Shuffle, Trunk flexed, Leaning posteriorly Gait velocity: decr     General Gait Details: pt with more flexed posture and posterior lean using RW compared to HHA. mod assist to steady. pt's HR 90's at rest, 110's after amb to bathroom. while amb back to recliner HR spiked to 147 bpm and took ~2 minutes to recover to 110's.   Stairs             Wheelchair Mobility    Modified Rankin (Stroke Patients Only)       Balance Overall balance assessment: Needs assistance Sitting-balance support: Feet supported, Bilateral upper extremity supported Sitting balance-Leahy Scale: Fair     Standing balance support: Reliant on assistive device for balance, During functional activity, Bilateral upper extremity supported Standing balance-Leahy Scale: Poor                              Cognition Arousal/Alertness: Awake/alert Behavior During Therapy: WFL for tasks assessed/performed Overall Cognitive Status: Impaired/Different from baseline Area of  Impairment: Orientation, Memory, Following commands, Problem solving, Safety/judgement, Awareness                 Orientation Level: Place, Time, Situation, Disoriented to   Memory: Decreased short-term memory Following Commands: Follows one step commands with increased  time Safety/Judgement: Decreased awareness of deficits Awareness: Intellectual Problem Solving: Slow processing, Difficulty sequencing, Requires verbal cues          Exercises      General Comments        Pertinent Vitals/Pain Pain Assessment Pain Assessment: No/denies pain Breathing: normal Negative Vocalization: none Facial Expression: smiling or inexpressive Body Language: relaxed Consolability: no need to console PAINAD Score: 0    Home Living                          Prior Function            PT Goals (current goals can now be found in the care plan section) Acute Rehab PT Goals PT Goal Formulation: With patient/family Time For Goal Achievement: 09/28/21 Potential to Achieve Goals: Good Progress towards PT goals: Progressing toward goals    Frequency    Min 3X/week      PT Plan Current plan remains appropriate    Co-evaluation              AM-PAC PT "6 Clicks" Mobility   Outcome Measure  Help needed turning from your back to your side while in a flat bed without using bedrails?: A Little Help needed moving from lying on your back to sitting on the side of a flat bed without using bedrails?: A Little Help needed moving to and from a bed to a chair (including a wheelchair)?: A Little Help needed standing up from a chair using your arms (e.g., wheelchair or bedside chair)?: A Little Help needed to walk in hospital room?: A Lot Help needed climbing 3-5 steps with a railing? : A Lot 6 Click Score: 16    End of Session Equipment Utilized During Treatment: Gait belt Activity Tolerance: Patient tolerated treatment well Patient left: in chair;with call bell/phone within reach;with chair alarm set;with nursing/sitter in room Nurse Communication: Mobility status PT Visit Diagnosis: Unsteadiness on feet (R26.81);Muscle weakness (generalized) (M62.81);Difficulty in walking, not elsewhere classified (R26.2)     Time: 3818-2993 PT Time  Calculation (min) (ACUTE ONLY): 27 min  Charges:  $Gait Training: 8-22 mins                     Verner Mould, DPT Acute Rehabilitation Services Office [phone] Pager [phone]    Jacques Navy 09/15/2021, 4:01 PM

## 2021-09-15 NOTE — Progress Notes (Signed)
Progress Note   Patient: [name] [name] TKW:[id number] DOB: Apr 07, 1934 DOA: 09/13/2021     Hospitalization day: 0 DOS: the patient was seen and examined on 09/15/2021   Brief hospital course: [name] [name] is a 88 y.o. female with medical history significant of HTN, HLD, type II DM, PAD, hypothyroidism, recent diagnosis of dermatomyositis on steroids. Patient presented with complaints of a syncopal event.  Assessment and Plan: * Syncope and collapse- (present on admission) Presents with an unresponsive event. Family actually provided CPR although patient had a pulse when EMS arrived.   CBG was 101 at the time of EMS evaluation patient had another episode of syncope while working with the EMS. At the time of my evaluation no focal deficit. Glucose 67 on admission.  Sodium 126.  EKG unremarkable.  CT head unremarkable.  Chest x-ray unremarkable. Family would like to discontinue EEG. Patient was given IV hydration.  Currently on hold. Oral intake improving.  PT OT recommends home with home health.  Hyponatremia- (present on admission) Serum osmolarity normal.  Urine osmolality relatively normal.  Urine sodium relatively normal.  Repeat testing on 08/2015 also normal. Sodium did not improve with IV and normal saline infusion. Later on started on urea tablets without any improvement. Sodium still remains on the lower side with 125.  Nephrology consulted.  Continue fluid restricted diet. Discontinue losartan.  Poor appetite- (present on admission) Patient reportedly have poor p.o. intake in the setting of constipation as well as thrush. Continue nutritional supplements. Patient's long-acting insulin was on hold.  Constipation- (present on admission) Continue MiraLAX and Colace. X-ray abdomen shows no significant stool burden. We will increase regimen to include Senokot.  THRUSH- (present on admission) Nystatin ordered.  Improving. In the setting of prednisone use.  Uncontrolled  diabetes mellitus with hypoglycemia without coma, with long-term current use of insulin (HCC) Hypoglycemic on arrival.  Now hyperglycemic. Changing dysphagia 3 diet to dysphagia 3 diet carb modified. Hemoglobin A1c 8.8 on admission. Family concerned with regarding hyperglycemia and ended up giving patient her home Levemir 7 units at 10 AM and home NovoLog 2 units at 3:20 PM on 2/26. Discussed with the family regarding concerns with duplication of the medication. Family currently understand and agrees with the plan. Continue home Levemir 7 units in the morning and 3 units in the night. Continue sliding scale moderate for now.  Dermatopolymyositis, unspecified, organ involvement unspecified ([place])- (present on admission) Following up with Dr. Posey Rea rheumatology outpatient. Recent diagnosis. Patient is on prednisone taper.  We will continue.  Currently on 20 mg in the morning and 10 mg in the evening.  Plan is to transition to methotrexate outpatient which will be helpful.  Hypothyroidism, acquired- (present on admission) Continue Synthroid.  Normal TSH and normal free T4.  Essential hypertension- (present on admission) Blood pressure well controlled.  Will resume amlodipine tomorrow.  And resume losartan tonight.  Hyperlipidemia- (present on admission) Continue Lipitor.  GERD (gastroesophageal reflux disease)- (present on admission) Continue Pepcid.  COVID-19 virus infection- (present on admission) Incidentally positive.  No prior history of COVID. Family reports that no one in the family has tested positive COVID on 2/26. Discussed with patient and verbalized understanding for starting her on Paxlovid based on the positivity.  Daughters would not like to continue Paxlovid and medicine is now discontinued. CT value 40. Will maintain isolation for now. Recommend family to monitor symptoms for next 4 to 10 days.  Patient will be eligible for initiating Paxlovid until 09/19/2021.  Sacral  ulcer ([place])- (present on admission) Patient developed a rash which was diagnosed as dermatomyositis. Most of the rash is resolved but the rash on her bottom has persisted. Family is using steroid cream for that. Does not appear infected. Wound care consulted.  Outpatient wound care pending recommended.  PAD (peripheral artery disease) ([place])- (present on admission) On aspirin.  Acute renal failure superimposed on stage 2 chronic kidney disease ([place])- (present on admission) Baseline serum creatinine around 0.7.  Serum creatinine on 2/27 worsened to 1.1.  Meeting AKI criteria.  Patient does not appear dehydrated.  Will monitor.  Subjective: No nausea no vomiting no fever no chills.  No chest pain, reports some abdominal pain.  Physical Exam: Vitals:   09/14/21 2043 09/15/21 0302 09/15/21 1503 09/15/21 1932  BP: (!) 151/65 (!) 142/69 134/68 (!) 148/83  Pulse: 68 70 79 96  Resp: 16 16 16 18   Temp: 98.1 F (36.7 C) 98 F (36.7 C) 98.5 F (36.9 C) 97.6 F (36.4 C)  TempSrc: Oral Oral Oral   SpO2: 100% 100% 99% 98%  Weight:      Height:       General: Appear in mild distress; no visible Abnormal Neck Mass Or lumps, Conjunctiva normal Cardiovascular: S1 and S2 Present, no Murmur, Respiratory: good respiratory effort, Bilateral Air entry present and CTA, no Crackles, no wheezes Abdomen: Bowel Sound present, some abdominal tenderness Extremities: no Pedal edema Neurology: alert and oriented to place and person Gait not checked due to patient safety concerns   Data Reviewed:  I have Reviewed nursing notes, Vitals, and Lab results since pt's last encounter. Pertinent lab results CBC, BMP, osmolality, urine studies I have ordered test including CBC, BMP I have discussed pt's care plan and test results with nephrology.   Family Communication: Discussed with both daughter as well as son at bedside.  Disposition: Status is: Observation  Author: Berle Mull, MD 09/15/2021 7:37  PM  For on call review [email or web address].

## 2021-09-15 NOTE — Progress Notes (Addendum)
Occupational Therapy Treatment Patient Details Name: [name] [name] MRN: [id number] DOB: 06-17-1934 Today's Date: 09/15/2021   History of present illness Patient is 88 y.o. female presented to [facility] after syncopal event when family found pt slumped over at breakfast and non-responsive, pt had a pulse, family  called 911 and were instructed to began chest compressions but pt then became alert. PMH significant for CVA, HTN, HLD, type II DM, PAD, hypothyroidism, recent diagnosis of dermatomyositis on steroids.   OT comments  Patient was seated in chair and agreeable to session. Patient required hand held assistance and physical assist to maintain standing balance to transition from recliner to commode for toileting with consistent sequencing cues for attention to task.patient was noted to require less assist for clothing management on this date than previous session.  Patient was asymptomatic to changes in vitals during session. Patient would continue to benefit from skilled OT services at this time while admitted and after d/c to address noted deficits in order to improve overall safety and independence in ADLs.     Recommendations for follow up therapy are one component of a multi-disciplinary discharge planning process, led by the attending physician.  Recommendations may be updated based on patient status, additional functional criteria and insurance authorization.    Follow Up Recommendations  Home health OT    Assistance Recommended at Discharge Frequent or constant Supervision/Assistance  Patient can return home with the following  A little help with walking and/or transfers;A lot of help with bathing/dressing/bathroom;Assistance with cooking/housework;Assistance with feeding;Direct supervision/assist for medications management;Direct supervision/assist for financial management;Help with stairs or ramp for entrance;Assist for transportation   Equipment Recommendations  BSC/3in1     Recommendations for Other Services      Precautions / Restrictions Precautions Precautions: Fall Precaution Comments: monitor HR Restrictions Weight Bearing Restrictions: No       Mobility Bed Mobility                    Transfers                         Balance Overall balance assessment: Needs assistance Sitting-balance support: Feet supported, Bilateral upper extremity supported Sitting balance-Leahy Scale: Fair     Standing balance support: Reliant on assistive device for balance, During functional activity, Bilateral upper extremity supported Standing balance-Leahy Scale: Poor                             ADL either performed or assessed with clinical judgement   ADL Overall ADL's : Needs assistance/impaired                           Toilet Transfer Details (indicate cue type and reason): patient was hand held assistance for transfers from recliner in room to bathroom with patietn noted to reach for all objects with LUE. patient was noted to have increased unsteadiness transitioning back to recliner with HR noted to reach max 152 bpm on telebox. nurse made aware. patient was noted to have HR quickly decreased with sitting in chair to 120s then to 110s with nurse in room at end of session. Toileting- Clothing Manipulation and Hygiene: Maximal assistance;Sit to/from stand Toileting - Clothing Manipulation Details (indicate cue type and reason): patient was able to wipe herself with noted pulling on IV site resulting in blood in IV site. patient was mod A to pull  pants up and ming uard to push down.  nurse in room to address at end of session.       General ADL Comments: attempted to use RW with patient noted to have flexed trunk over walker with increased asssitance needed to steer v.s. with no AD.    Extremity/Trunk Assessment              Vision       Perception     Praxis      Cognition Arousal/Alertness:  Awake/alert Behavior During Therapy: WFL for tasks assessed/performed Overall Cognitive Status: Impaired/Different from baseline                                 General Comments: patient needed increased cues to attend to task and follow one step commands.        Exercises      Shoulder Instructions       General Comments Patient was noted to have HR max of 152 bpm during session with nurse made aware. Patients BP was taken with 121/72 mmhg.HR was noted to trend back down when activity was stopped and patient was resting in chair.    Pertinent Vitals/ Pain       Pain Assessment Pain Assessment: No/denies pain  Home Living                                          Prior Functioning/Environment              Frequency  Min 2X/week        Progress Toward Goals  OT Goals(current goals can now be found in the care plan section)  Progress towards OT goals: Progressing toward goals     Plan Discharge plan remains appropriate    Co-evaluation    PT/OT/SLP Co-Evaluation/Treatment: Yes Reason for Co-Treatment: To address functional/ADL transfers PT goals addressed during session: Mobility/safety with mobility OT goals addressed during session: ADL's and self-care      AM-PAC OT "6 Clicks" Daily Activity     Outcome Measure   Help from another person eating meals?: A Little Help from another person taking care of personal grooming?: A Little Help from another person toileting, which includes using toliet, bedpan, or urinal?: A Lot Help from another person bathing (including washing, rinsing, drying)?: A Lot Help from another person to put on and taking off regular upper body clothing?: A Lot Help from another person to put on and taking off regular lower body clothing?: A Lot 6 Click Score: 14    End of Session Equipment Utilized During Treatment: Gait belt;Rolling walker (2 wheels)  OT Visit Diagnosis: Unsteadiness on feet  (R26.81);Muscle weakness (generalized) (M62.81)   Activity Tolerance Patient tolerated treatment well   Patient Left in chair;with call bell/phone within reach;with chair alarm set;with nursing/sitter in room   Nurse Communication Mobility status        Time: 0100-7121 OT Time Calculation (min): 24 min  Charges: OT General Charges $OT Visit: 1 Visit OT Treatments $Self Care/Home Management : 8-22 mins  Jackelyn Poling OTR/L, MS Acute Rehabilitation Department Office# [phone] Pager# [phone]   Marcellina Millin 09/15/2021, 4:41 PM

## 2021-09-16 ENCOUNTER — Inpatient Hospital Stay (HOSPITAL_COMMUNITY): Payer: Medicare HMO

## 2021-09-16 DIAGNOSIS — B379 Candidiasis, unspecified: Secondary | ICD-10-CM | POA: Diagnosis present

## 2021-09-16 DIAGNOSIS — R630 Anorexia: Secondary | ICD-10-CM | POA: Diagnosis present

## 2021-09-16 DIAGNOSIS — N182 Chronic kidney disease, stage 2 (mild): Secondary | ICD-10-CM | POA: Diagnosis present

## 2021-09-16 DIAGNOSIS — E11649 Type 2 diabetes mellitus with hypoglycemia without coma: Secondary | ICD-10-CM | POA: Diagnosis not present

## 2021-09-16 DIAGNOSIS — K219 Gastro-esophageal reflux disease without esophagitis: Secondary | ICD-10-CM | POA: Diagnosis present

## 2021-09-16 DIAGNOSIS — U071 COVID-19: Secondary | ICD-10-CM | POA: Diagnosis not present

## 2021-09-16 DIAGNOSIS — E7800 Pure hypercholesterolemia, unspecified: Secondary | ICD-10-CM | POA: Diagnosis present

## 2021-09-16 DIAGNOSIS — Z66 Do not resuscitate: Secondary | ICD-10-CM | POA: Diagnosis not present

## 2021-09-16 DIAGNOSIS — R1310 Dysphagia, unspecified: Secondary | ICD-10-CM | POA: Diagnosis present

## 2021-09-16 DIAGNOSIS — I129 Hypertensive chronic kidney disease with stage 1 through stage 4 chronic kidney disease, or unspecified chronic kidney disease: Secondary | ICD-10-CM | POA: Diagnosis not present

## 2021-09-16 DIAGNOSIS — Z79899 Other long term (current) drug therapy: Secondary | ICD-10-CM | POA: Diagnosis not present

## 2021-09-16 DIAGNOSIS — E039 Hypothyroidism, unspecified: Secondary | ICD-10-CM | POA: Diagnosis not present

## 2021-09-16 DIAGNOSIS — E1151 Type 2 diabetes mellitus with diabetic peripheral angiopathy without gangrene: Secondary | ICD-10-CM | POA: Diagnosis present

## 2021-09-16 DIAGNOSIS — R609 Edema, unspecified: Secondary | ICD-10-CM

## 2021-09-16 DIAGNOSIS — E1122 Type 2 diabetes mellitus with diabetic chronic kidney disease: Secondary | ICD-10-CM | POA: Diagnosis present

## 2021-09-16 DIAGNOSIS — E871 Hypo-osmolality and hyponatremia: Secondary | ICD-10-CM | POA: Diagnosis not present

## 2021-09-16 DIAGNOSIS — R600 Localized edema: Secondary | ICD-10-CM | POA: Diagnosis present

## 2021-09-16 DIAGNOSIS — Z681 Body mass index (BMI) 19 or less, adult: Secondary | ICD-10-CM | POA: Diagnosis not present

## 2021-09-16 DIAGNOSIS — M810 Age-related osteoporosis without current pathological fracture: Secondary | ICD-10-CM | POA: Diagnosis present

## 2021-09-16 DIAGNOSIS — R55 Syncope and collapse: Secondary | ICD-10-CM | POA: Diagnosis not present

## 2021-09-16 DIAGNOSIS — N179 Acute kidney failure, unspecified: Secondary | ICD-10-CM | POA: Diagnosis not present

## 2021-09-16 DIAGNOSIS — K5900 Constipation, unspecified: Secondary | ICD-10-CM | POA: Diagnosis present

## 2021-09-16 DIAGNOSIS — L98498 Non-pressure chronic ulcer of skin of other sites with other specified severity: Secondary | ICD-10-CM | POA: Diagnosis present

## 2021-09-16 DIAGNOSIS — M3390 Dermatopolymyositis, unspecified, organ involvement unspecified: Secondary | ICD-10-CM | POA: Diagnosis not present

## 2021-09-16 DIAGNOSIS — R350 Frequency of micturition: Secondary | ICD-10-CM | POA: Diagnosis present

## 2021-09-16 LAB — BASIC METABOLIC PANEL
Anion gap: 6 (ref 5–15)
BUN: 47 mg/dL — ABNORMAL HIGH (ref 8–23)
CO2: 26 mmol/L (ref 22–32)
Calcium: 9 mg/dL (ref 8.9–10.3)
Chloride: 96 mmol/L — ABNORMAL LOW (ref 98–111)
Creatinine, Ser: 0.97 mg/dL (ref 0.44–1.00)
GFR, Estimated: 57 mL/min — ABNORMAL LOW (ref >60)
Glucose, Bld: 176 mg/dL — ABNORMAL HIGH (ref 70–99)
Potassium: 4.7 mmol/L (ref 3.5–5.1)
Sodium: 128 mmol/L — ABNORMAL LOW (ref 135–145)

## 2021-09-16 LAB — GLUCOSE, CAPILLARY: Glucose-Capillary: 162 mg/dL — ABNORMAL HIGH (ref 70–99)

## 2021-09-16 LAB — CBC
HCT: 28.2 % — ABNORMAL LOW (ref 36.0–46.0)
Hemoglobin: 9.9 g/dL — ABNORMAL LOW (ref 12.0–15.0)
MCH: 34.5 pg — ABNORMAL HIGH (ref 26.0–34.0)
MCHC: 35.1 g/dL (ref 30.0–36.0)
MCV: 98.3 fL (ref 80.0–100.0)
Platelets: 288 10*3/uL (ref 150–400)
RBC: 2.87 MIL/uL — ABNORMAL LOW (ref 3.87–5.11)
RDW: 15.4 % (ref 11.5–15.5)
WBC: 10.1 10*3/uL (ref 4.0–10.5)
nRBC: 0 % (ref 0.0–0.2)

## 2021-09-16 MED ORDER — NYSTATIN 100000 UNIT/ML MT SUSP: 5.0000 mL | Freq: Four times a day (QID) | OROMUCOSAL | 0 refills | Status: AC

## 2021-09-16 MED ORDER — DOCUSATE SODIUM 100 MG PO CAPS
100.0000 mg | ORAL_CAPSULE | Freq: Two times a day (BID) | ORAL | 0 refills | Status: DC
Start: 2021-09-16 — End: 2022-01-30

## 2021-09-16 MED ORDER — ENOXAPARIN SODIUM 40 MG/0.4ML IJ SOSY: 40.0000 mg | PREFILLED_SYRINGE | INTRAMUSCULAR | Status: DC

## 2021-09-16 MED ORDER — SODIUM CHLORIDE 1 G PO TABS: ORAL_TABLET | ORAL | 0 refills | Status: AC

## 2021-09-16 MED ORDER — GLUCERNA SHAKE PO LIQD
237.0000 mL | Freq: Three times a day (TID) | ORAL | 0 refills | Status: DC
Start: 2021-09-16 — End: 2022-05-13

## 2021-09-16 MED ORDER — POLYETHYLENE GLYCOL 3350 17 G PO PACK
17.0000 g | PACK | Freq: Every day | ORAL | 0 refills | Status: DC
Start: 2021-09-16 — End: 2023-02-24

## 2021-09-16 MED ORDER — ZINC OXIDE 40 % EX OINT: TOPICAL_OINTMENT | Freq: Three times a day (TID) | CUTANEOUS | 0 refills | Status: DC

## 2021-09-16 NOTE — Progress Notes (Signed)
Left lower extremity venous duplex has been completed. Preliminary results can be found in CV Proc through chart review.   09/16/21 11:22 AM [name] [name] RVT

## 2021-09-16 NOTE — TOC Transition Note (Signed)
Transition of Care [facility]) - CM/SW Discharge Note   Patient Details  Name: [name] [name] MRN: [id number] Date of Birth: [birth year]  Transition of Care [facility] CM/SW Contact:  Ross Ludwig, LCSW Phone Number: 09/16/2021, 12:24 PM   Clinical Narrative:    Patient discharging today, [place] set up through [facility], see note from yesterday.  CSW signing off.   Final next level of care: [place] Barriers to Discharge: Barriers Resolved   Patient Goals and CMS Choice Patient states their goals for this hospitalization and ongoing recovery are:: To return back home to her daughter's house. CMS Medicare.gov Compare Post Acute Care list provided to:: Patient Represenative (must comment) Choice offered to / list presented to : Adult Children  Discharge Placement                       Discharge Plan and Services                          HH Arranged: PT HH Agency: [place] Date [facility] Agency Contacted: 09/15/21 Time [place]: 2549 Representative spoke with at [place]: Cindie  Social Determinants of Health ([place]) Interventions     Readmission Risk Interventions No flowsheet data found.

## 2021-09-16 NOTE — Plan of Care (Signed)
°  Problem: Clinical Measurements: Goal: Ability to maintain clinical measurements within normal limits will improve Outcome: Not Progressing   Problem: Elimination: Goal: Will not experience complications related to bowel motility Outcome: Not Progressing   Problem: Nutrition: Goal: Adequate nutrition will be maintained Outcome: Not Progressing   Problem: Safety: Goal: Ability to remain free from injury will improve Outcome: Not Progressing   Problem: Skin Integrity: Goal: Risk for impaired skin integrity will decrease Outcome: Not Progressing

## 2021-09-17 NOTE — Discharge Summary (Signed)
Physician Discharge Summary   Patient: [name] [name] MRN: [id number] DOB: [birth year]  Admit date:     09/13/2021  Discharge date: 09/16/2021  Discharge Physician: Berle Mull  PCP: Lajean Manes, MD  Recommendations at discharge: Follow-up with PCP with CBC and CMP  Discharge Diagnoses: Principal Problem:   Syncope and collapse Active Problems:   Hyponatremia   THRUSH   Constipation   Poor appetite   Dermatopolymyositis, unspecified, organ involvement unspecified ([place])   Uncontrolled diabetes mellitus with hypoglycemia without coma, with long-term current use of insulin (HCC)   Hypothyroidism, acquired   Essential hypertension   Hyperlipidemia   GERD (gastroesophageal reflux disease)   Acute renal failure superimposed on stage 2 chronic kidney disease (HCC)   PAD (peripheral artery disease) ([place])   Sacral ulcer ([place])   COVID-19 virus infection   Hospital Course: [name] [name] is a 88 y.o. female with medical history significant of HTN, HLD, type II DM, PAD, hypothyroidism, recent diagnosis of dermatomyositis on steroids. Patient presented with complaints of a syncopal event. Found to have a hyponatremia as well as hypoglycemia on admission.  Initially treated with IV normal saline infusion as the patient was hypovolemic clinically on exam with a history of poor p.o. intake.  Sodium level did not improve adequately and patient developed hyperglycemia.  Insulin levels were adjusted and patient was started on urea tablet.  Patient sodium level did not improve.  Nephrology was consulted patient was started on fluid restriction and salt tablets with improvement in sodium level and oral intake.  Assessment and Plan: * Syncope and collapse- (present on admission) Presents with an unresponsive event. Family actually provided CPR although patient had a pulse when EMS arrived.   CBG was 101 at the time of EMS evaluation patient had another episode of syncope while working with the  EMS. At the time of my evaluation no focal deficit. Glucose 67 on admission.  Sodium 126.  EKG unremarkable.  CT head unremarkable.  Chest x-ray unremarkable. Family would like to discontinue EEG. Patient was given IV hydration.  Blood pressure now better.  No dizziness or presyncopal symptoms. Oral intake improving.  PT OT recommends home with home health.  Hyponatremia- (present on admission)   No evidence of sleep dizziness or syncope.Serum osmolarity normal.  Urine osmolality relatively normal.  Urine sodium relatively normal.  Repeat testing on 08/2015 also normal. Sodium did not improve with IV and normal saline infusion. Later on started on urea tablets without any improvement. Nephrology consulted.  Continue fluid restricted diet along with sodium tablets. Discontinue losartan.  Sodium improved.  Continue sodium tablets for a few days.,  Outpatient follow-up with PCP with repeat CBC and CMP.  Poor appetite- (present on admission) Patient reportedly have poor p.o. intake in the setting of constipation as well as thrush. Continue nutritional supplements. Patient's long-acting insulin was on hold initially. Continue home regimen on discharge..  Constipation- (present on admission) Continue MiraLAX and Colace. X-ray abdomen shows no significant stool burden. Continue current regimen.  THRUSH- (present on admission) Nystatin ordered.  Improving. In the setting of prednisone use.  Uncontrolled diabetes mellitus with hypoglycemia without coma, with long-term current use of insulin (HCC) Hypoglycemic on arrival.  Now hyperglycemic. Changing dysphagia 3 diet to dysphagia 3 diet carb modified. Hemoglobin A1c 8.8 on admission. Family concerned with regarding hyperglycemia and ended up giving patient her home Levemir 7 units at 10 AM and home NovoLog 2 units at 3:20 PM on 2/26. Discussed with the family  regarding concerns with risk of duplication of the medication. Family currently  understand and agrees with the plan. Continue home Levemir 7 units in the morning and 3 units in the night. Continue home regimen of Levemir plus sliding scale.  Dermatopolymyositis, unspecified, organ involvement unspecified ([place])- (present on admission) Following up with Dr. Posey Rea rheumatology outpatient. Recent diagnosis. Patient is on prednisone taper.  We will continue.  Currently on 20 mg in the morning and 10 mg in the evening.  Plan is to transition to methotrexate outpatient which will be helpful.  Hypothyroidism, acquired- (present on admission) Continue Synthroid.  Normal TSH and normal free T4.  Essential hypertension- (present on admission) Blood pressure well controlled.  Will resume amlodipine. Hold losartan in the setting of hyponatremia  Hyperlipidemia- (present on admission) Continue Lipitor.  GERD (gastroesophageal reflux disease)- (present on admission) Continue Pepcid.  COVID-19 virus infection- (present on admission) Incidentally positive.  No prior history of COVID. Family reports that no one in the family has tested positive COVID on 2/26. Discussed with patient and verbalized understanding for starting her on Paxlovid based on the positivity.  Daughters would not like to continue Paxlovid and medicine is now discontinued. CT value 40. Will maintain isolation for now. Recommend family to monitor symptoms for next 4 to 10 days.  Patient will be eligible for initiating Paxlovid until 09/19/2021.  Sacral ulcer ([place])- (present on admission) Patient developed a rash which was diagnosed as dermatomyositis. Most of the rash is resolved but the rash on her bottom has persisted. Family is using steroid cream for that. Does not appear infected. Wound care consulted.  Outpatient wound care pending recommended.  PAD (peripheral artery disease) ([place])- (present on admission) On aspirin.  Acute renal failure superimposed on stage 2 chronic kidney disease ([place])- (present  on admission) Baseline serum creatinine around 0.7.  Serum creatinine on 2/27 worsened to 1.1.  Meeting AKI criteria.  Patient does not appear dehydrated.  Creatinine improved.  Outpatient follow-up.  Left leg edema. Lower extremity Doppler performed negative for any DVT.  Consultants: Nephrology Procedures performed: None DISCHARGE MEDICATION: Allergies as of 09/16/2021       Reactions   Nsaids Other (See Comments)   No nsaids due to kidney function!!!! Especially ibuprofen or Aleve.   Biaxin [clarithromycin] Hives   Sulfa Antibiotics Hives, Other (See Comments)   NO sulfa-based meds!!   Bactrim [sulfamethoxazole-trimethoprim] Hives, Rash   Lisinopril Cough        Medication List     STOP taking these medications    HYDROcodone-acetaminophen 5-325 MG tablet Commonly known as: NORCO/VICODIN   losartan 50 MG tablet Commonly known as: COZAAR   terconazole 0.8 % vaginal cream Commonly known as: TERAZOL 3       TAKE these medications    acetaminophen 650 MG CR tablet Commonly known as: TYLENOL Take 1,300 mg by mouth at bedtime as needed for pain.   amLODipine 2.5 MG tablet Commonly known as: NORVASC Take 2.5 mg by mouth in the morning.   atorvastatin 40 MG tablet Commonly known as: LIPITOR Take 40 mg by mouth at bedtime.   azelastine 0.1 % nasal spray Commonly known as: ASTELIN Place 2 sprays into both nostrils 2 (two) times daily. Use in each nostril as directed   B-D UF III MINI PEN NEEDLES 31G X 5 MM Misc Generic drug: Insulin Pen Needle USE AS DIRECTED TID   Bayer Low Dose 81 MG EC tablet Generic drug: aspirin Take 81 mg by mouth in  the morning. Swallow whole.   cetirizine 10 MG tablet Commonly known as: ZYRTEC Take 10 mg by mouth daily as needed for allergies.   docusate sodium 100 MG capsule Commonly known as: Colace Take 1 capsule (100 mg total) by mouth 2 (two) times daily.   feeding supplement (GLUCERNA SHAKE) Liqd Take 237 mLs by mouth 3  (three) times daily between meals.   fluticasone 50 MCG/ACT nasal spray Commonly known as: FLONASE Place 1-2 sprays into both nostrils daily as needed for allergies or rhinitis.   Fusion Plus Caps Take 1 capsule by mouth daily with breakfast.   Levemir FlexTouch 100 UNIT/ML FlexPen Generic drug: insulin detemir 3-7 Units See admin instructions. Inject 7 units into the skin in the morning and 3 units at bedtime if BGL is 200 or below and 4 units if BGL is greater than 200   levETIRAcetam 500 MG tablet Commonly known as: KEPPRA Take 1 tablet (500 mg total) by mouth 2 (two) times daily. What changed: when to take this   liver oil-zinc oxide 40 % ointment Commonly known as: DESITIN Apply topically 3 (three) times daily.   mometasone 50 MCG/ACT nasal spray Commonly known as: NASONEX Place 2 sprays into the nose daily as needed (for allergies or rhinitis- when not using Astelin).   montelukast 10 MG tablet Commonly known as: Singulair Take 1 tablet (10 mg total) by mouth at bedtime.   NON FORMULARY Take 1 capsule by mouth See admin instructions. Fish oil 1,000 mg/Omega-3 300 mg capsules- Take 1 capsule by mouth every morning and at bedtime   NovoLOG FlexPen 100 UNIT/ML FlexPen Generic drug: insulin aspart 2-10 Units See admin instructions. Inject 2-10 units into the skin three times a day with meals, per sliding scale:  Breakfast: BGL 80-199 = 8 units; 200-299 = 9 units; 300 or greater = 10 units Lunch: BGL 80-199 = 5 units; 200-299 = 6 units; 300 or greater = 7 units Supper/evening meal: BGL 80-199 = 2 units; 200-299 = 3 units; 300 or greater = 4 units   nystatin 100000 UNIT/ML suspension Commonly known as: MYCOSTATIN Take 5 mLs (500,000 Units total) by mouth 4 (four) times daily for 10 days.   One-A-Day Womens Formula Tabs Take 1 tablet by mouth daily after breakfast.   OneTouch Delica Lancets 25K Misc IC-10 CODE:  E11.65  Check blood sugar   OneTouch Verio test  strip Generic drug: glucose blood 1 each 3 (three) times daily.   PEPCID AC PO Take 1 tablet by mouth at bedtime. What changed: Another medication with the same name was removed. Continue taking this medication, and follow the directions you see here.   polyethylene glycol 17 g packet Commonly known as: MIRALAX / GLYCOLAX Take 17 g by mouth daily.   predniSONE 20 MG tablet Commonly known as: DELTASONE Take 10-20 mg by mouth See admin instructions. Take 20 mg by mouth in the morning and 10 mg at bedtime- through 09/16/2021. Decrease to 10 mg by mouth in the morning and at bedtime as of 09/17/2021. Further directions to be provided by MD [facility].   sodium chloride 1 g tablet Take 2 tablets (2 g total) by mouth in the morning AND 1 tablet (1 g total) every evening. Do all this for 4 days.   Synthroid 200 MCG tablet Generic drug: levothyroxine Take 200 mcg by mouth daily before breakfast.   VITAMIN D-3 PO Take 10,000 Units by mouth in the morning.  Discharge Care Instructions  (From admission, onward)           Start     Ordered   09/16/21 0000  Discharge wound care:       Comments: Cleanse with skin cleanser, pat dry. Apply Desitin cream to affected areas three times daily after cleansing and PRN soiling or inadvertent removal. Turn patient side to side and minimize time in the supine position   09/16/21 1139            Follow-up Information     Stoneking, Hal, MD. Schedule an appointment as soon as possible for a visit in 1 week(s).   Specialty: Internal Medicine Why: with CBC and BMP Contact information: [address] [place] [address] [phone]                Disposition: Home health Diet recommendation:  Discharge Diet Orders (From admission, onward)     Start     Ordered   09/16/21 0000  Diet - low sodium heart healthy        09/16/21 1139           Discharge Exam: Filed Weights   09/13/21 2046   Weight: 51.8 kg   General: Appear in no distress; no visible Abnormal Neck Mass Or lumps, Conjunctiva normal Cardiovascular: S1 and S2 Present, no Murmur, Respiratory: good respiratory effort, Bilateral Air entry present and CTA, no Crackles, no wheezes Abdomen: Bowel Sound present  Extremities: Left pedal edema Neurology: alert and oriented to time, place, and person Gait not checked due to patient safety concerns   Condition at discharge: good  The results of significant diagnostics from this hospitalization (including imaging, microbiology, ancillary and laboratory) are listed below for reference.   Imaging Studies: DG Chest 2 View  Result Date: 09/13/2021 CLINICAL DATA:  Status post CPR EXAM: CHEST - 2 VIEW COMPARISON:  08/14/2021 FINDINGS: Cardiac size is within normal limits. There are no signs pulmonary edema or focal pulmonary consolidation. There is no pleural effusion or pneumothorax. No definite displaced fractures are seen in the ribs. There is small calcific density in the left apical region which may suggest a granuloma or bone island. IMPRESSION: No active cardiopulmonary disease. Electronically Signed   By: Elmer Picker M.D.   On: 09/13/2021 13:06   CT Head Wo Contrast  Result Date: 09/13/2021 CLINICAL DATA:  Mental status change. EXAM: CT HEAD WITHOUT CONTRAST TECHNIQUE: Contiguous axial images were obtained from the base of the skull through the vertex without intravenous contrast. RADIATION DOSE REDUCTION: This exam was performed according to the departmental dose-optimization program which includes automated exposure control, adjustment of the mA and/or kV according to patient size and/or use of iterative reconstruction technique. COMPARISON:  08/06/2021. FINDINGS: Brain: No evidence of acute infarction, hemorrhage, hydrocephalus, extra-axial collection or mass lesion/mass effect. There is ventricular and sulcal enlargement reflecting mild to moderate generalized  atrophy, unchanged. Patchy white matter hypoattenuation is noted bilaterally consistent with chronic microvascular ischemic change, also stable. Old right thalamic lacunar infarct is stable. Vascular: No hyperdense vessel or unexpected calcification. Skull: Normal. Negative for fracture or focal lesion. Sinuses/Orbits: Globes and orbits are unremarkable. Visualized sinuses are clear. Other: None. IMPRESSION: 1. No acute intracranial abnormalities. 2. Atrophy and chronic microvascular ischemic change stable from the recent prior study. Electronically Signed   By: Lajean Manes M.D.   On: 09/13/2021 12:58   US Abdomen Complete  Result Date: 08/27/2021 CLINICAL DATA:  Elevated creatinine and liver  enzymes EXAM: ABDOMEN ULTRASOUND COMPLETE COMPARISON:  CT 08/18/2019 FINDINGS: Gallbladder: No gallstones or wall thickening visualized. No sonographic Murphy sign noted by sonographer. Common bile duct: Diameter: 4.7 mm Liver: Echogenicity within normal limits. No focal hepatic abnormality is seen. Portal vein is patent on color Doppler imaging with normal direction of blood flow towards the liver. IVC: No abnormality visualized. Pancreas: Visualized portion unremarkable. Spleen: Size and appearance within normal limits. Right Kidney: Length: 9.9 cm. Cortex appears echogenic. No mass or hydronephrosis. Left Kidney: Length: 10.2 cm. Cortex appears echogenic. No hydronephrosis. Cyst at the lower pole measuring 26 mm. Abdominal aorta: No aneurysm visualized. Other findings: None. IMPRESSION: 1. Slightly echogenic kidneys consistent with medical renal disease. No hydronephrosis. 2. The liver is grossly within normal limits. Electronically Signed   By: Donavan Foil M.D.   On: 08/27/2021 21:59   DG SWALLOW FUNC OP MEDICARE SPEECH PATH  Result Date: 08/27/2021 Table formatting from the original result was not included. Objective Swallowing Evaluation: Type of Study: MBS-Modified Barium Swallow Study  Patient Details Name:  ARINA TORRY MRN: [id number] Date of Birth: 08-21-1933 Today's Date: 08/27/2021 Time: SLP Start Time (ACUTE ONLY): 5852 -SLP Stop Time (ACUTE ONLY): 1210 SLP Time Calculation (min) (ACUTE ONLY): 25 min Past Medical History: Past Medical History: Diagnosis Date  Arthritis   "knees, legs" (06/10/2016)  Ascites   Chronic kidney disease   "related to my diabetes"  Chronic lower back pain   Diabetes mellitus without complication (HCC)   Eczema   GERD (gastroesophageal reflux disease)   High cholesterol   Hyperlipidemia   Hypertension   Hypothyroidism   OAB (overactive bladder)   Osteoporosis   Thyroid disease   Type II diabetes mellitus ([place])   Urticaria  Past Surgical History: Past Surgical History: Procedure Laterality Date  ABDOMINAL HYSTERECTOMY    KNEE ARTHROSCOPY    LAPAROTOMY N/A 12/03/2016  Procedure: EXPLORATORY LAPAROTOMY, LYSIS OF ADHESIONS;  Surgeon: Armandina Gemma, MD;  Location: [facility];  Service: General;  Laterality: N/A;  LOOP RECORDER INSERTION N/A 08/23/2019  Procedure: LOOP RECORDER INSERTION;  Surgeon: Evans Lance, MD;  Location: [facility];  Service: Cardiovascular;  Laterality: N/A;  vocal cord polyps   HPI: Irelyn Perfecto is an 88 y.o. female with PMH: 2015 cervical spinal stenosis, and in 2020 persistent C3-4 spinal stenosis, GERD, HLD, HTN, DM-2, CKD. She presented to this outpatient MBS secondary to daughter's reports that patient has had a progressive cough following a choking episode while eating and although cough has been clearing up, it is still present.  Subjective: pleasant, daughter providing history  Recommendations for follow up therapy are one component of a multi-disciplinary discharge planning process, led by the attending physician.  Recommendations may be updated based on patient status, additional functional criteria and insurance authorization. Assessment / Plan / Recommendation Clinical Impressions 08/27/2021 Clinical Impression Patient presents with a mild oral and a mild  pharyngeal phase dysphagia as per this MBS. During oral phase, she exhibited delayed mastication and oral anterior to posterior transit of puree solids and regular solids. During pharyngeal phase, she exhibited swallow initiation delays to level of vallecular sinus and at times pyriform sinus with thin liquids. Trace penetration which did not fully clear occurred with thin liquid sips via cup or via straw sip however no aspiration was observed. She exhibited trace vallecular sinus residuals with thin liquids, moderate vallecular residuals with puree and regular texture solids. Vallecular residuals would slowly clear with subsequent swallows but full clearance  not observed. Presence of suspected cervical osteophytes observed but did not appear to impede bolus transit. In addition, mild retrograde movement of puree solid bolus observed however bolus stayed below UES. SLP discussed results with patient and daughter with recommendations to watch for patient tilting head back when drinking, focusing on foods that have some moisture and avoiding hard, tough, dense foods. SLP Visit Diagnosis Dysphagia, oropharyngeal phase (R13.12) Attention and concentration deficit following -- Frontal lobe and executive function deficit following -- Impact on safety and function No limitations;Mild aspiration risk   Treatment Recommendations 10/08/2019 Treatment Recommendations Defer until completion of intrumental exam   Prognosis 10/08/2019 Prognosis for Safe Diet Advancement Good Barriers to Reach Goals -- Barriers/Prognosis Comment -- Diet Recommendations 08/27/2021 SLP Diet Recommendations Dysphagia 3 (Mech soft) solids;Thin liquid Liquid Administration via Cup;Straw Medication Administration Whole meds with liquid Compensations Follow solids with liquid;Small sips/bites;Slow rate Postural Changes Remain semi-upright after after feeds/meals (Comment);Seated upright at 90 degrees   Other Recommendations 08/27/2021 Recommended Consults --  Oral Care Recommendations -- Other Recommendations -- Follow Up Recommendations No SLP follow up Assistance recommended at discharge -- Functional Status Assessment -- Frequency and Duration  10/08/2019 Speech Therapy Frequency (ACUTE ONLY) min 3x week Treatment Duration --   Oral Phase 08/27/2021 Oral Phase Impaired Oral - Pudding Teaspoon -- Oral - Pudding Cup -- Oral - Honey Teaspoon -- Oral - Honey Cup -- Oral - Nectar Teaspoon -- Oral - Nectar Cup WFL Oral - Nectar Straw -- Oral - Thin Teaspoon -- Oral - Thin Cup WFL Oral - Thin Straw WFL Oral - Puree Delayed oral transit Oral - Mech Soft -- Oral - Regular Impaired mastication;Delayed oral transit Oral - Multi-Consistency -- Oral - Pill -- Oral Phase - Comment --  Pharyngeal Phase 08/27/2021 Pharyngeal Phase Impaired Pharyngeal- Pudding Teaspoon -- Pharyngeal -- Pharyngeal- Pudding Cup -- Pharyngeal -- Pharyngeal- Honey Teaspoon -- Pharyngeal -- Pharyngeal- Honey Cup -- Pharyngeal -- Pharyngeal- Nectar Teaspoon -- Pharyngeal -- Pharyngeal- Nectar Cup Reduced epiglottic inversion;Pharyngeal residue - valleculae Pharyngeal -- Pharyngeal- Nectar Straw -- Pharyngeal -- Pharyngeal- Thin Teaspoon -- Pharyngeal -- Pharyngeal- Thin Cup Delayed swallow initiation-vallecula;Delayed swallow initiation-pyriform sinuses;Reduced airway/laryngeal closure;Reduced epiglottic inversion;Penetration/Aspiration during swallow;Pharyngeal residue - pyriform;Pharyngeal residue - valleculae Pharyngeal Material enters airway, remains ABOVE vocal cords then ejected out;Material enters airway, remains ABOVE vocal cords and not ejected out Pharyngeal- Thin Straw Delayed swallow initiation-pyriform sinuses;Delayed swallow initiation-vallecula;Penetration/Aspiration during swallow;Pharyngeal residue - valleculae;Pharyngeal residue - pyriform Pharyngeal Material enters airway, remains ABOVE vocal cords then ejected out;Material enters airway, remains ABOVE vocal cords and not ejected out  Pharyngeal- Puree Reduced pharyngeal peristalsis;Pharyngeal residue - valleculae Pharyngeal -- Pharyngeal- Mechanical Soft -- Pharyngeal -- Pharyngeal- Regular Reduced pharyngeal peristalsis;Pharyngeal residue - valleculae Pharyngeal -- Pharyngeal- Multi-consistency -- Pharyngeal -- Pharyngeal- Pill -- Pharyngeal -- Pharyngeal Comment --  Cervical Esophageal Phase  08/27/2021 Cervical Esophageal Phase Impaired Pudding Teaspoon -- Pudding Cup -- Honey Teaspoon -- Honey Cup -- Nectar Teaspoon -- Nectar Cup -- Nectar Straw -- Thin Teaspoon -- Thin Cup WFL Thin Straw -- Puree Esophageal backflow into cervical esophagus Mechanical Soft -- Regular -- Multi-consistency -- Pill -- Cervical Esophageal Comment --  Sonia Baller, MA, CCC-SLP Speech Therapy           CLINICAL DATA:  Provided history: Dysphagia, unspecified type. Additional history provided: Patient reports coughing when eating, choking episodes. EXAM: MODIFIED BARIUM SWALLOW TECHNIQUE: Different consistencies of barium were administered orally to the patient by the Speech Pathologist. Imaging of the pharynx was  performed in the lateral projection. Rowe Robert, PA was present in the fluoroscopy room for this study, providing personal supervision. FLUOROSCOPY: Fluoroscopy Time:  1 minute, 47 seconds Radiation Exposure Index (if provided by the fluoroscopic device): 9.91 mGy COMPARISON:  None FINDINGS: Different consistencies of barium were administered orally to the patient by the Speech Pathologist with fluoroscopic imaging of the pharynx, as well as limited imaging of the esophagus, from a lateral projection. Rowe Robert, PA was present in the fluoroscopy room for this study, providing personal supervision. The Speech Pathologist observed laryngeal penetration with thin consistency. However, the Speech Pathologist did not observe frank aspiration. Please refer to the Speech Pathologist's report for full details. IMPRESSION: Modified barium swallow, as  described. Laryngeal penetration was observed with thin consistency. No frank aspiration. Please refer to the Speech Pathologist's report for complete details and recommendations. Electronically Signed   By: Kellie Simmering D.O.   On: 08/27/2021 12:45   DG Abd Portable 1V  Result Date: 09/15/2021 CLINICAL DATA:  Constipation EXAM: PORTABLE ABDOMEN - 1 VIEW COMPARISON:  None. FINDINGS: Portable supine exam with some degree of rotation. Grossly normal appearing colonic stool burden. No significant dilatation pattern, ileus or obstruction. Aortoiliac vascular calcifications noted. Degenerative changes of the lumbosacral spine. No acute osseous finding. IMPRESSION: Normal bowel gas pattern as above. Aortoiliac atherosclerosis Electronically Signed   By: Jerilynn Mages.  Shick M.D.   On: 09/15/2021 11:03   CUP PACEART REMOTE DEVICE CHECK  Result Date: 09/07/2021 ILR summary report received. Battery status OK. Normal device function. No new symptom, tachy, brady, or pause episodes. There were five previously viewed and reviewed AF episodes, that all appeared to be AT.  Monthly summary reports and ROV/PRN Kathy Breach, RN, CCDS, [employer]  VAS [place] LOWER EXTREMITY VENOUS (DVT)  Result Date: 09/16/2021  Lower Venous DVT Study Patient Name:  SYD MANGES  Date of Exam:   09/16/2021 Medical Rec #: [id number]        Accession #:    [id number] Date of Birth: 10/15/1933        Patient Gender: F Patient Age:   85 years Exam Location:  [facility] Procedure:      VAS [place] LOWER EXTREMITY VENOUS (DVT) Referring Phys: Peniel Hass --------------------------------------------------------------------------------  Indications: Edema.  Risk Factors: COVID 19 positive. Comparison Study: No prior studies. Performing Technologist: Oliver Hum RVT  Examination Guidelines: A complete evaluation includes B-mode imaging, spectral Doppler, color Doppler, and power Doppler as needed of all accessible portions of each vessel.  Bilateral testing is considered an integral part of a complete examination. Limited examinations for reoccurring indications may be performed as noted. The reflux portion of the exam is performed with the patient in reverse Trendelenburg.  +-----+---------------+---------+-----------+----------+--------------+  RIGHT Compressibility Phasicity Spontaneity Properties Thrombus Aging  +-----+---------------+---------+-----------+----------+--------------+  CFV   Full            Yes       Yes                                    +-----+---------------+---------+-----------+----------+--------------+   +---------+---------------+---------+-----------+----------+--------------+  LEFT      Compressibility Phasicity Spontaneity Properties Thrombus Aging  +---------+---------------+---------+-----------+----------+--------------+  CFV       Full            Yes       Yes                                    +---------+---------------+---------+-----------+----------+--------------+  SFJ       Full                                                             +---------+---------------+---------+-----------+----------+--------------+  FV Prox   Full                                                             +---------+---------------+---------+-----------+----------+--------------+  FV Mid    Full                                                             +---------+---------------+---------+-----------+----------+--------------+  FV Distal Full                                                             +---------+---------------+---------+-----------+----------+--------------+  PFV       Full                                                             +---------+---------------+---------+-----------+----------+--------------+  POP       Full            Yes       Yes                                    +---------+---------------+---------+-----------+----------+--------------+  PTV       Full                                                              +---------+---------------+---------+-----------+----------+--------------+  PERO      Full                                                             +---------+---------------+---------+-----------+----------+--------------+    Summary: RIGHT: - No evidence of common femoral vein obstruction.  LEFT: - There is no evidence of deep vein thrombosis in the lower extremity.  - No cystic structure found in the popliteal fossa.  *See table(s) above for measurements and observations. Electronically signed by Deitra Mayo MD on 09/16/2021 at 1:36:24 PM.  Final     Microbiology: Results for orders placed or performed during the hospital encounter of 09/13/21  Resp Panel by RT-PCR (Flu A&B, Covid) Nasopharyngeal Swab     Status: Abnormal   Collection Time: 09/13/21  5:56 PM   Specimen: Nasopharyngeal Swab; Nasopharyngeal(NP) swabs in vial transport medium  Result Value Ref Range Status   SARS Coronavirus 2 by RT PCR POSITIVE (A) NEGATIVE Final    Comment: (NOTE) SARS-CoV-2 target nucleic acids are DETECTED.  The SARS-CoV-2 RNA is generally detectable in upper respiratory specimens during the acute phase of infection. Positive results are indicative of the presence of the identified virus, but do not rule out bacterial infection or co-infection with other pathogens not detected by the test. Clinical correlation with patient history and other diagnostic information is necessary to determine patient infection status. The expected result is Negative.  Fact Sheet for Patients: EntrepreneurPulse.com.au  Fact Sheet for Healthcare Providers: IncredibleEmployment.be  This test is not yet approved or cleared by the [place] FDA and  has been authorized for detection and/or diagnosis of SARS-CoV-2 by FDA under an Emergency Use Authorization (EUA).  This EUA will remain in effect (meaning this test can be used) for the duration of  the  COVID-19 declaration under Section 564(b)(1) of the A ct, 21 U.S.C. section 360bbb-3(b)(1), unless the authorization is terminated or revoked sooner.     Influenza A by PCR NEGATIVE NEGATIVE Final   Influenza B by PCR NEGATIVE NEGATIVE Final    Comment: (NOTE) The Xpert Xpress SARS-CoV-2/FLU/RSV plus assay is intended as an aid in the diagnosis of influenza from Nasopharyngeal swab specimens and should not be used as a sole basis for treatment. Nasal washings and aspirates are unacceptable for Xpert Xpress SARS-CoV-2/FLU/RSV testing.  Fact Sheet for Patients: EntrepreneurPulse.com.au  Fact Sheet for Healthcare Providers: IncredibleEmployment.be  This test is not yet approved or cleared by the [place] FDA and has been authorized for detection and/or diagnosis of SARS-CoV-2 by FDA under an Emergency Use Authorization (EUA). This EUA will remain in effect (meaning this test can be used) for the duration of the COVID-19 declaration under Section 564(b)(1) of the Act, 21 U.S.C. section 360bbb-3(b)(1), unless the authorization is terminated or revoked.  Performed at [facility], [place] [address], [place], [place] [address]   Resp Panel by RT-PCR (Flu A&B, Covid) Nasopharyngeal Swab     Status: Abnormal   Collection Time: 09/14/21 10:50 AM   Specimen: Nasopharyngeal Swab; Nasopharyngeal(NP) swabs in vial transport medium  Result Value Ref Range Status   SARS Coronavirus 2 by RT PCR POSITIVE (A) NEGATIVE Final    Comment: (NOTE) SARS-CoV-2 target nucleic acids are DETECTED.  The SARS-CoV-2 RNA is generally detectable in upper respiratory specimens during the acute phase of infection. Positive results are indicative of the presence of the identified virus, but do not rule out bacterial infection or co-infection with other pathogens not detected by the test. Clinical correlation with patient history and other diagnostic  information is necessary to determine patient infection status. The expected result is Negative.  Fact Sheet for Patients: EntrepreneurPulse.com.au  Fact Sheet for Healthcare Providers: IncredibleEmployment.be  This test is not yet approved or cleared by the [place] FDA and  has been authorized for detection and/or diagnosis of SARS-CoV-2 by FDA under an Emergency Use Authorization (EUA).  This EUA will remain in effect (meaning this test can be used) for the duration of  the COVID-19 declaration under Section 564(b)(1) of the  A ct, 21 U.S.C. section 360bbb-3(b)(1), unless the authorization is terminated or revoked sooner.     Influenza A by PCR NEGATIVE NEGATIVE Final   Influenza B by PCR NEGATIVE NEGATIVE Final    Comment: (NOTE) The Xpert Xpress SARS-CoV-2/FLU/RSV plus assay is intended as an aid in the diagnosis of influenza from Nasopharyngeal swab specimens and should not be used as a sole basis for treatment. Nasal washings and aspirates are unacceptable for Xpert Xpress SARS-CoV-2/FLU/RSV testing.  Fact Sheet for Patients: EntrepreneurPulse.com.au  Fact Sheet for Healthcare Providers: IncredibleEmployment.be  This test is not yet approved or cleared by the [place] FDA and has been authorized for detection and/or diagnosis of SARS-CoV-2 by FDA under an Emergency Use Authorization (EUA). This EUA will remain in effect (meaning this test can be used) for the duration of the COVID-19 declaration under Section 564(b)(1) of the Act, 21 U.S.C. section 360bbb-3(b)(1), unless the authorization is terminated or revoked.  Performed at [facility], [place] [address], [place], [place] [address]     Labs: CBC: Recent Labs  Lab 09/13/21 1158 09/14/21 0846 09/15/21 0031 09/16/21 0509  WBC 10.4 7.7 8.4 10.1  NEUTROABS 8.9*  --   --   --   HGB 12.6 11.3* 10.6* 9.9*  HCT  39.1 33.1* 29.9* 28.2*  MCV 107.4* 100.0 97.4 98.3  PLT 270 293 308 263   Basic Metabolic Panel: Recent Labs  Lab 09/15/21 0806 09/15/21 1309 09/15/21 1502 09/15/21 1935 09/16/21 0509  NA 124* 123* 123* 125* 128*  K 4.9 4.7 4.7 5.0 4.7  CL 89* 89* 86* 88* 96*  CO2 28 27 30 29 26   GLUCOSE 135* 213* 316* 374* 176*  BUN 36* 34* 37* 48* 47*  CREATININE 0.71 0.79 1.11* 1.52* 0.97  CALCIUM 9.4 9.3 9.2 9.6 9.0   Liver Function Tests: Recent Labs  Lab 09/13/21 1524  AST 33  ALT 44  ALKPHOS 62  BILITOT 0.4  PROT 6.4*  ALBUMIN 3.4*   CBG: Recent Labs  Lab 09/15/21 0759 09/15/21 1158 09/15/21 1714 09/15/21 2017 09/16/21 0806  GLUCAP 143* 206* 369* 366* 162*    Discharge time spent: greater than 30 minutes.  Signed: Berle Mull, MD [facility] 09/16/2021

## 2021-09-19 DIAGNOSIS — Z993 Dependence on wheelchair: Secondary | ICD-10-CM | POA: Diagnosis not present

## 2021-09-19 DIAGNOSIS — R54 Age-related physical debility: Secondary | ICD-10-CM | POA: Diagnosis not present

## 2021-09-22 DIAGNOSIS — E871 Hypo-osmolality and hyponatremia: Secondary | ICD-10-CM | POA: Diagnosis not present

## 2021-09-22 DIAGNOSIS — E1142 Type 2 diabetes mellitus with diabetic polyneuropathy: Secondary | ICD-10-CM | POA: Diagnosis not present

## 2021-09-22 DIAGNOSIS — E1165 Type 2 diabetes mellitus with hyperglycemia: Secondary | ICD-10-CM | POA: Diagnosis not present

## 2021-09-22 DIAGNOSIS — E039 Hypothyroidism, unspecified: Secondary | ICD-10-CM | POA: Diagnosis not present

## 2021-09-22 DIAGNOSIS — Z794 Long term (current) use of insulin: Secondary | ICD-10-CM | POA: Diagnosis not present

## 2021-09-24 DIAGNOSIS — Z8673 Personal history of transient ischemic attack (TIA), and cerebral infarction without residual deficits: Secondary | ICD-10-CM | POA: Diagnosis not present

## 2021-09-24 DIAGNOSIS — N183 Chronic kidney disease, stage 3 unspecified: Secondary | ICD-10-CM | POA: Diagnosis not present

## 2021-09-24 DIAGNOSIS — E1151 Type 2 diabetes mellitus with diabetic peripheral angiopathy without gangrene: Secondary | ICD-10-CM | POA: Diagnosis not present

## 2021-09-24 DIAGNOSIS — Z794 Long term (current) use of insulin: Secondary | ICD-10-CM | POA: Diagnosis not present

## 2021-09-24 DIAGNOSIS — M170 Bilateral primary osteoarthritis of knee: Secondary | ICD-10-CM | POA: Diagnosis not present

## 2021-09-24 DIAGNOSIS — S31000D Unspecified open wound of lower back and pelvis without penetration into retroperitoneum, subsequent encounter: Secondary | ICD-10-CM | POA: Diagnosis not present

## 2021-09-24 DIAGNOSIS — E7800 Pure hypercholesterolemia, unspecified: Secondary | ICD-10-CM | POA: Diagnosis not present

## 2021-09-24 DIAGNOSIS — E039 Hypothyroidism, unspecified: Secondary | ICD-10-CM | POA: Diagnosis not present

## 2021-09-24 DIAGNOSIS — Z7952 Long term (current) use of systemic steroids: Secondary | ICD-10-CM | POA: Diagnosis not present

## 2021-09-24 DIAGNOSIS — Z9181 History of falling: Secondary | ICD-10-CM | POA: Diagnosis not present

## 2021-09-24 DIAGNOSIS — Z7982 Long term (current) use of aspirin: Secondary | ICD-10-CM | POA: Diagnosis not present

## 2021-09-24 DIAGNOSIS — N3281 Overactive bladder: Secondary | ICD-10-CM | POA: Diagnosis not present

## 2021-09-24 DIAGNOSIS — E1122 Type 2 diabetes mellitus with diabetic chronic kidney disease: Secondary | ICD-10-CM | POA: Diagnosis not present

## 2021-09-24 DIAGNOSIS — M19071 Primary osteoarthritis, right ankle and foot: Secondary | ICD-10-CM | POA: Diagnosis not present

## 2021-09-24 DIAGNOSIS — K219 Gastro-esophageal reflux disease without esophagitis: Secondary | ICD-10-CM | POA: Diagnosis not present

## 2021-09-24 DIAGNOSIS — M3390 Dermatopolymyositis, unspecified, organ involvement unspecified: Secondary | ICD-10-CM | POA: Diagnosis not present

## 2021-09-24 DIAGNOSIS — I129 Hypertensive chronic kidney disease with stage 1 through stage 4 chronic kidney disease, or unspecified chronic kidney disease: Secondary | ICD-10-CM | POA: Diagnosis not present

## 2021-09-24 DIAGNOSIS — M810 Age-related osteoporosis without current pathological fracture: Secondary | ICD-10-CM | POA: Diagnosis not present

## 2021-09-24 DIAGNOSIS — M19072 Primary osteoarthritis, left ankle and foot: Secondary | ICD-10-CM | POA: Diagnosis not present

## 2021-09-24 DIAGNOSIS — E871 Hypo-osmolality and hyponatremia: Secondary | ICD-10-CM | POA: Diagnosis not present

## 2021-09-29 DIAGNOSIS — Z8673 Personal history of transient ischemic attack (TIA), and cerebral infarction without residual deficits: Secondary | ICD-10-CM | POA: Diagnosis not present

## 2021-09-29 DIAGNOSIS — N3281 Overactive bladder: Secondary | ICD-10-CM | POA: Diagnosis not present

## 2021-09-29 DIAGNOSIS — E1151 Type 2 diabetes mellitus with diabetic peripheral angiopathy without gangrene: Secondary | ICD-10-CM | POA: Diagnosis not present

## 2021-09-29 DIAGNOSIS — E1122 Type 2 diabetes mellitus with diabetic chronic kidney disease: Secondary | ICD-10-CM | POA: Diagnosis not present

## 2021-09-29 DIAGNOSIS — E7800 Pure hypercholesterolemia, unspecified: Secondary | ICD-10-CM | POA: Diagnosis not present

## 2021-09-29 DIAGNOSIS — Z7982 Long term (current) use of aspirin: Secondary | ICD-10-CM | POA: Diagnosis not present

## 2021-09-29 DIAGNOSIS — M19071 Primary osteoarthritis, right ankle and foot: Secondary | ICD-10-CM | POA: Diagnosis not present

## 2021-09-29 DIAGNOSIS — M810 Age-related osteoporosis without current pathological fracture: Secondary | ICD-10-CM | POA: Diagnosis not present

## 2021-09-29 DIAGNOSIS — M19072 Primary osteoarthritis, left ankle and foot: Secondary | ICD-10-CM | POA: Diagnosis not present

## 2021-09-29 DIAGNOSIS — K219 Gastro-esophageal reflux disease without esophagitis: Secondary | ICD-10-CM | POA: Diagnosis not present

## 2021-09-29 DIAGNOSIS — M3390 Dermatopolymyositis, unspecified, organ involvement unspecified: Secondary | ICD-10-CM | POA: Diagnosis not present

## 2021-09-29 DIAGNOSIS — Z7952 Long term (current) use of systemic steroids: Secondary | ICD-10-CM | POA: Diagnosis not present

## 2021-09-29 DIAGNOSIS — I129 Hypertensive chronic kidney disease with stage 1 through stage 4 chronic kidney disease, or unspecified chronic kidney disease: Secondary | ICD-10-CM | POA: Diagnosis not present

## 2021-09-29 DIAGNOSIS — S31000D Unspecified open wound of lower back and pelvis without penetration into retroperitoneum, subsequent encounter: Secondary | ICD-10-CM | POA: Diagnosis not present

## 2021-09-29 DIAGNOSIS — N183 Chronic kidney disease, stage 3 unspecified: Secondary | ICD-10-CM | POA: Diagnosis not present

## 2021-09-29 DIAGNOSIS — E871 Hypo-osmolality and hyponatremia: Secondary | ICD-10-CM | POA: Diagnosis not present

## 2021-09-29 DIAGNOSIS — E039 Hypothyroidism, unspecified: Secondary | ICD-10-CM | POA: Diagnosis not present

## 2021-09-29 DIAGNOSIS — Z794 Long term (current) use of insulin: Secondary | ICD-10-CM | POA: Diagnosis not present

## 2021-09-29 DIAGNOSIS — Z9181 History of falling: Secondary | ICD-10-CM | POA: Diagnosis not present

## 2021-09-29 DIAGNOSIS — M170 Bilateral primary osteoarthritis of knee: Secondary | ICD-10-CM | POA: Diagnosis not present

## 2021-10-01 ENCOUNTER — Encounter (HOSPITAL_BASED_OUTPATIENT_CLINIC_OR_DEPARTMENT_OTHER): Payer: Medicare HMO | Admitting: General Surgery

## 2021-10-01 DIAGNOSIS — K219 Gastro-esophageal reflux disease without esophagitis: Secondary | ICD-10-CM | POA: Diagnosis not present

## 2021-10-01 DIAGNOSIS — E1142 Type 2 diabetes mellitus with diabetic polyneuropathy: Secondary | ICD-10-CM | POA: Diagnosis not present

## 2021-10-01 DIAGNOSIS — S31000D Unspecified open wound of lower back and pelvis without penetration into retroperitoneum, subsequent encounter: Secondary | ICD-10-CM | POA: Diagnosis not present

## 2021-10-01 DIAGNOSIS — Z7982 Long term (current) use of aspirin: Secondary | ICD-10-CM | POA: Diagnosis not present

## 2021-10-01 DIAGNOSIS — E1151 Type 2 diabetes mellitus with diabetic peripheral angiopathy without gangrene: Secondary | ICD-10-CM | POA: Diagnosis not present

## 2021-10-01 DIAGNOSIS — L89152 Pressure ulcer of sacral region, stage 2: Secondary | ICD-10-CM | POA: Diagnosis not present

## 2021-10-01 DIAGNOSIS — I700 Atherosclerosis of aorta: Secondary | ICD-10-CM | POA: Diagnosis not present

## 2021-10-01 DIAGNOSIS — Z7952 Long term (current) use of systemic steroids: Secondary | ICD-10-CM | POA: Diagnosis not present

## 2021-10-01 DIAGNOSIS — M19071 Primary osteoarthritis, right ankle and foot: Secondary | ICD-10-CM | POA: Diagnosis not present

## 2021-10-01 DIAGNOSIS — M3390 Dermatopolymyositis, unspecified, organ involvement unspecified: Secondary | ICD-10-CM | POA: Diagnosis not present

## 2021-10-01 DIAGNOSIS — R55 Syncope and collapse: Secondary | ICD-10-CM | POA: Diagnosis not present

## 2021-10-01 DIAGNOSIS — N183 Chronic kidney disease, stage 3 unspecified: Secondary | ICD-10-CM | POA: Diagnosis not present

## 2021-10-01 DIAGNOSIS — Z79899 Other long term (current) drug therapy: Secondary | ICD-10-CM | POA: Diagnosis not present

## 2021-10-01 DIAGNOSIS — I129 Hypertensive chronic kidney disease with stage 1 through stage 4 chronic kidney disease, or unspecified chronic kidney disease: Secondary | ICD-10-CM | POA: Diagnosis not present

## 2021-10-01 DIAGNOSIS — E782 Mixed hyperlipidemia: Secondary | ICD-10-CM | POA: Diagnosis not present

## 2021-10-01 DIAGNOSIS — M170 Bilateral primary osteoarthritis of knee: Secondary | ICD-10-CM | POA: Diagnosis not present

## 2021-10-01 DIAGNOSIS — M810 Age-related osteoporosis without current pathological fracture: Secondary | ICD-10-CM | POA: Diagnosis not present

## 2021-10-01 DIAGNOSIS — E039 Hypothyroidism, unspecified: Secondary | ICD-10-CM | POA: Diagnosis not present

## 2021-10-01 DIAGNOSIS — Z794 Long term (current) use of insulin: Secondary | ICD-10-CM | POA: Diagnosis not present

## 2021-10-01 DIAGNOSIS — E222 Syndrome of inappropriate secretion of antidiuretic hormone: Secondary | ICD-10-CM | POA: Diagnosis not present

## 2021-10-01 DIAGNOSIS — E1122 Type 2 diabetes mellitus with diabetic chronic kidney disease: Secondary | ICD-10-CM | POA: Diagnosis not present

## 2021-10-01 DIAGNOSIS — Z8673 Personal history of transient ischemic attack (TIA), and cerebral infarction without residual deficits: Secondary | ICD-10-CM | POA: Diagnosis not present

## 2021-10-01 DIAGNOSIS — E1165 Type 2 diabetes mellitus with hyperglycemia: Secondary | ICD-10-CM | POA: Diagnosis not present

## 2021-10-01 DIAGNOSIS — E871 Hypo-osmolality and hyponatremia: Secondary | ICD-10-CM | POA: Diagnosis not present

## 2021-10-01 DIAGNOSIS — N3281 Overactive bladder: Secondary | ICD-10-CM | POA: Diagnosis not present

## 2021-10-01 DIAGNOSIS — E7800 Pure hypercholesterolemia, unspecified: Secondary | ICD-10-CM | POA: Diagnosis not present

## 2021-10-01 DIAGNOSIS — M19072 Primary osteoarthritis, left ankle and foot: Secondary | ICD-10-CM | POA: Diagnosis not present

## 2021-10-01 DIAGNOSIS — Z9181 History of falling: Secondary | ICD-10-CM | POA: Diagnosis not present

## 2021-10-03 DIAGNOSIS — N183 Chronic kidney disease, stage 3 unspecified: Secondary | ICD-10-CM | POA: Diagnosis not present

## 2021-10-03 DIAGNOSIS — M19071 Primary osteoarthritis, right ankle and foot: Secondary | ICD-10-CM | POA: Diagnosis not present

## 2021-10-03 DIAGNOSIS — E1122 Type 2 diabetes mellitus with diabetic chronic kidney disease: Secondary | ICD-10-CM | POA: Diagnosis not present

## 2021-10-03 DIAGNOSIS — N3281 Overactive bladder: Secondary | ICD-10-CM | POA: Diagnosis not present

## 2021-10-03 DIAGNOSIS — E039 Hypothyroidism, unspecified: Secondary | ICD-10-CM | POA: Diagnosis not present

## 2021-10-03 DIAGNOSIS — I129 Hypertensive chronic kidney disease with stage 1 through stage 4 chronic kidney disease, or unspecified chronic kidney disease: Secondary | ICD-10-CM | POA: Diagnosis not present

## 2021-10-03 DIAGNOSIS — E7800 Pure hypercholesterolemia, unspecified: Secondary | ICD-10-CM | POA: Diagnosis not present

## 2021-10-03 DIAGNOSIS — E871 Hypo-osmolality and hyponatremia: Secondary | ICD-10-CM | POA: Diagnosis not present

## 2021-10-03 DIAGNOSIS — E1151 Type 2 diabetes mellitus with diabetic peripheral angiopathy without gangrene: Secondary | ICD-10-CM | POA: Diagnosis not present

## 2021-10-03 DIAGNOSIS — M19072 Primary osteoarthritis, left ankle and foot: Secondary | ICD-10-CM | POA: Diagnosis not present

## 2021-10-03 DIAGNOSIS — Z7982 Long term (current) use of aspirin: Secondary | ICD-10-CM | POA: Diagnosis not present

## 2021-10-03 DIAGNOSIS — M3390 Dermatopolymyositis, unspecified, organ involvement unspecified: Secondary | ICD-10-CM | POA: Diagnosis not present

## 2021-10-03 DIAGNOSIS — M810 Age-related osteoporosis without current pathological fracture: Secondary | ICD-10-CM | POA: Diagnosis not present

## 2021-10-03 DIAGNOSIS — S31000D Unspecified open wound of lower back and pelvis without penetration into retroperitoneum, subsequent encounter: Secondary | ICD-10-CM | POA: Diagnosis not present

## 2021-10-03 DIAGNOSIS — Z794 Long term (current) use of insulin: Secondary | ICD-10-CM | POA: Diagnosis not present

## 2021-10-03 DIAGNOSIS — Z9181 History of falling: Secondary | ICD-10-CM | POA: Diagnosis not present

## 2021-10-03 DIAGNOSIS — Z8673 Personal history of transient ischemic attack (TIA), and cerebral infarction without residual deficits: Secondary | ICD-10-CM | POA: Diagnosis not present

## 2021-10-03 DIAGNOSIS — K219 Gastro-esophageal reflux disease without esophagitis: Secondary | ICD-10-CM | POA: Diagnosis not present

## 2021-10-03 DIAGNOSIS — M170 Bilateral primary osteoarthritis of knee: Secondary | ICD-10-CM | POA: Diagnosis not present

## 2021-10-03 DIAGNOSIS — Z7952 Long term (current) use of systemic steroids: Secondary | ICD-10-CM | POA: Diagnosis not present

## 2021-10-06 DIAGNOSIS — K219 Gastro-esophageal reflux disease without esophagitis: Secondary | ICD-10-CM | POA: Diagnosis not present

## 2021-10-06 DIAGNOSIS — E1122 Type 2 diabetes mellitus with diabetic chronic kidney disease: Secondary | ICD-10-CM | POA: Diagnosis not present

## 2021-10-06 DIAGNOSIS — M170 Bilateral primary osteoarthritis of knee: Secondary | ICD-10-CM | POA: Diagnosis not present

## 2021-10-06 DIAGNOSIS — R350 Frequency of micturition: Secondary | ICD-10-CM | POA: Diagnosis not present

## 2021-10-06 DIAGNOSIS — E871 Hypo-osmolality and hyponatremia: Secondary | ICD-10-CM | POA: Diagnosis not present

## 2021-10-06 DIAGNOSIS — I129 Hypertensive chronic kidney disease with stage 1 through stage 4 chronic kidney disease, or unspecified chronic kidney disease: Secondary | ICD-10-CM | POA: Diagnosis not present

## 2021-10-06 DIAGNOSIS — Z8673 Personal history of transient ischemic attack (TIA), and cerebral infarction without residual deficits: Secondary | ICD-10-CM | POA: Diagnosis not present

## 2021-10-06 DIAGNOSIS — Z7982 Long term (current) use of aspirin: Secondary | ICD-10-CM | POA: Diagnosis not present

## 2021-10-06 DIAGNOSIS — E1151 Type 2 diabetes mellitus with diabetic peripheral angiopathy without gangrene: Secondary | ICD-10-CM | POA: Diagnosis not present

## 2021-10-06 DIAGNOSIS — Z7952 Long term (current) use of systemic steroids: Secondary | ICD-10-CM | POA: Diagnosis not present

## 2021-10-06 DIAGNOSIS — Z9181 History of falling: Secondary | ICD-10-CM | POA: Diagnosis not present

## 2021-10-06 DIAGNOSIS — M3390 Dermatopolymyositis, unspecified, organ involvement unspecified: Secondary | ICD-10-CM | POA: Diagnosis not present

## 2021-10-06 DIAGNOSIS — E7800 Pure hypercholesterolemia, unspecified: Secondary | ICD-10-CM | POA: Diagnosis not present

## 2021-10-06 DIAGNOSIS — N183 Chronic kidney disease, stage 3 unspecified: Secondary | ICD-10-CM | POA: Diagnosis not present

## 2021-10-06 DIAGNOSIS — N3281 Overactive bladder: Secondary | ICD-10-CM | POA: Diagnosis not present

## 2021-10-06 DIAGNOSIS — M19072 Primary osteoarthritis, left ankle and foot: Secondary | ICD-10-CM | POA: Diagnosis not present

## 2021-10-06 DIAGNOSIS — S31000D Unspecified open wound of lower back and pelvis without penetration into retroperitoneum, subsequent encounter: Secondary | ICD-10-CM | POA: Diagnosis not present

## 2021-10-06 DIAGNOSIS — Z794 Long term (current) use of insulin: Secondary | ICD-10-CM | POA: Diagnosis not present

## 2021-10-06 DIAGNOSIS — M19071 Primary osteoarthritis, right ankle and foot: Secondary | ICD-10-CM | POA: Diagnosis not present

## 2021-10-06 DIAGNOSIS — M810 Age-related osteoporosis without current pathological fracture: Secondary | ICD-10-CM | POA: Diagnosis not present

## 2021-10-06 DIAGNOSIS — E039 Hypothyroidism, unspecified: Secondary | ICD-10-CM | POA: Diagnosis not present

## 2021-10-07 DIAGNOSIS — E1151 Type 2 diabetes mellitus with diabetic peripheral angiopathy without gangrene: Secondary | ICD-10-CM | POA: Diagnosis not present

## 2021-10-07 DIAGNOSIS — K219 Gastro-esophageal reflux disease without esophagitis: Secondary | ICD-10-CM | POA: Diagnosis not present

## 2021-10-07 DIAGNOSIS — M19072 Primary osteoarthritis, left ankle and foot: Secondary | ICD-10-CM | POA: Diagnosis not present

## 2021-10-07 DIAGNOSIS — Z7952 Long term (current) use of systemic steroids: Secondary | ICD-10-CM | POA: Diagnosis not present

## 2021-10-07 DIAGNOSIS — Z9181 History of falling: Secondary | ICD-10-CM | POA: Diagnosis not present

## 2021-10-07 DIAGNOSIS — M810 Age-related osteoporosis without current pathological fracture: Secondary | ICD-10-CM | POA: Diagnosis not present

## 2021-10-07 DIAGNOSIS — E1140 Type 2 diabetes mellitus with diabetic neuropathy, unspecified: Secondary | ICD-10-CM | POA: Diagnosis not present

## 2021-10-07 DIAGNOSIS — N3281 Overactive bladder: Secondary | ICD-10-CM | POA: Diagnosis not present

## 2021-10-07 DIAGNOSIS — E1122 Type 2 diabetes mellitus with diabetic chronic kidney disease: Secondary | ICD-10-CM | POA: Diagnosis not present

## 2021-10-07 DIAGNOSIS — E039 Hypothyroidism, unspecified: Secondary | ICD-10-CM | POA: Diagnosis not present

## 2021-10-07 DIAGNOSIS — M19071 Primary osteoarthritis, right ankle and foot: Secondary | ICD-10-CM | POA: Diagnosis not present

## 2021-10-07 DIAGNOSIS — M3390 Dermatopolymyositis, unspecified, organ involvement unspecified: Secondary | ICD-10-CM | POA: Diagnosis not present

## 2021-10-07 DIAGNOSIS — M170 Bilateral primary osteoarthritis of knee: Secondary | ICD-10-CM | POA: Diagnosis not present

## 2021-10-07 DIAGNOSIS — Z8673 Personal history of transient ischemic attack (TIA), and cerebral infarction without residual deficits: Secondary | ICD-10-CM | POA: Diagnosis not present

## 2021-10-07 DIAGNOSIS — N183 Chronic kidney disease, stage 3 unspecified: Secondary | ICD-10-CM | POA: Diagnosis not present

## 2021-10-07 DIAGNOSIS — I129 Hypertensive chronic kidney disease with stage 1 through stage 4 chronic kidney disease, or unspecified chronic kidney disease: Secondary | ICD-10-CM | POA: Diagnosis not present

## 2021-10-07 DIAGNOSIS — E1165 Type 2 diabetes mellitus with hyperglycemia: Secondary | ICD-10-CM | POA: Diagnosis not present

## 2021-10-07 DIAGNOSIS — E119 Type 2 diabetes mellitus without complications: Secondary | ICD-10-CM | POA: Diagnosis not present

## 2021-10-07 DIAGNOSIS — Z7982 Long term (current) use of aspirin: Secondary | ICD-10-CM | POA: Diagnosis not present

## 2021-10-07 DIAGNOSIS — S31000D Unspecified open wound of lower back and pelvis without penetration into retroperitoneum, subsequent encounter: Secondary | ICD-10-CM | POA: Diagnosis not present

## 2021-10-07 DIAGNOSIS — E871 Hypo-osmolality and hyponatremia: Secondary | ICD-10-CM | POA: Diagnosis not present

## 2021-10-07 DIAGNOSIS — E7800 Pure hypercholesterolemia, unspecified: Secondary | ICD-10-CM | POA: Diagnosis not present

## 2021-10-07 DIAGNOSIS — Z794 Long term (current) use of insulin: Secondary | ICD-10-CM | POA: Diagnosis not present

## 2021-10-08 DIAGNOSIS — M3390 Dermatopolymyositis, unspecified, organ involvement unspecified: Secondary | ICD-10-CM | POA: Diagnosis not present

## 2021-10-08 DIAGNOSIS — M19072 Primary osteoarthritis, left ankle and foot: Secondary | ICD-10-CM | POA: Diagnosis not present

## 2021-10-08 DIAGNOSIS — E039 Hypothyroidism, unspecified: Secondary | ICD-10-CM | POA: Diagnosis not present

## 2021-10-08 DIAGNOSIS — K219 Gastro-esophageal reflux disease without esophagitis: Secondary | ICD-10-CM | POA: Diagnosis not present

## 2021-10-08 DIAGNOSIS — Z794 Long term (current) use of insulin: Secondary | ICD-10-CM | POA: Diagnosis not present

## 2021-10-08 DIAGNOSIS — Z9181 History of falling: Secondary | ICD-10-CM | POA: Diagnosis not present

## 2021-10-08 DIAGNOSIS — S31000D Unspecified open wound of lower back and pelvis without penetration into retroperitoneum, subsequent encounter: Secondary | ICD-10-CM | POA: Diagnosis not present

## 2021-10-08 DIAGNOSIS — N183 Chronic kidney disease, stage 3 unspecified: Secondary | ICD-10-CM | POA: Diagnosis not present

## 2021-10-08 DIAGNOSIS — M810 Age-related osteoporosis without current pathological fracture: Secondary | ICD-10-CM | POA: Diagnosis not present

## 2021-10-08 DIAGNOSIS — I129 Hypertensive chronic kidney disease with stage 1 through stage 4 chronic kidney disease, or unspecified chronic kidney disease: Secondary | ICD-10-CM | POA: Diagnosis not present

## 2021-10-08 DIAGNOSIS — Z7982 Long term (current) use of aspirin: Secondary | ICD-10-CM | POA: Diagnosis not present

## 2021-10-08 DIAGNOSIS — N3281 Overactive bladder: Secondary | ICD-10-CM | POA: Diagnosis not present

## 2021-10-08 DIAGNOSIS — E1151 Type 2 diabetes mellitus with diabetic peripheral angiopathy without gangrene: Secondary | ICD-10-CM | POA: Diagnosis not present

## 2021-10-08 DIAGNOSIS — M170 Bilateral primary osteoarthritis of knee: Secondary | ICD-10-CM | POA: Diagnosis not present

## 2021-10-08 DIAGNOSIS — E871 Hypo-osmolality and hyponatremia: Secondary | ICD-10-CM | POA: Diagnosis not present

## 2021-10-08 DIAGNOSIS — E1122 Type 2 diabetes mellitus with diabetic chronic kidney disease: Secondary | ICD-10-CM | POA: Diagnosis not present

## 2021-10-08 DIAGNOSIS — Z7952 Long term (current) use of systemic steroids: Secondary | ICD-10-CM | POA: Diagnosis not present

## 2021-10-08 DIAGNOSIS — Z8673 Personal history of transient ischemic attack (TIA), and cerebral infarction without residual deficits: Secondary | ICD-10-CM | POA: Diagnosis not present

## 2021-10-08 DIAGNOSIS — M19071 Primary osteoarthritis, right ankle and foot: Secondary | ICD-10-CM | POA: Diagnosis not present

## 2021-10-08 DIAGNOSIS — E7800 Pure hypercholesterolemia, unspecified: Secondary | ICD-10-CM | POA: Diagnosis not present

## 2021-10-13 ENCOUNTER — Ambulatory Visit (INDEPENDENT_AMBULATORY_CARE_PROVIDER_SITE_OTHER): Payer: Medicare HMO

## 2021-10-13 DIAGNOSIS — I6389 Other cerebral infarction: Secondary | ICD-10-CM

## 2021-10-14 DIAGNOSIS — M19072 Primary osteoarthritis, left ankle and foot: Secondary | ICD-10-CM | POA: Diagnosis not present

## 2021-10-14 DIAGNOSIS — M810 Age-related osteoporosis without current pathological fracture: Secondary | ICD-10-CM | POA: Diagnosis not present

## 2021-10-14 DIAGNOSIS — L89152 Pressure ulcer of sacral region, stage 2: Secondary | ICD-10-CM | POA: Diagnosis not present

## 2021-10-14 DIAGNOSIS — M170 Bilateral primary osteoarthritis of knee: Secondary | ICD-10-CM | POA: Diagnosis not present

## 2021-10-14 DIAGNOSIS — E039 Hypothyroidism, unspecified: Secondary | ICD-10-CM | POA: Diagnosis not present

## 2021-10-14 DIAGNOSIS — I129 Hypertensive chronic kidney disease with stage 1 through stage 4 chronic kidney disease, or unspecified chronic kidney disease: Secondary | ICD-10-CM | POA: Diagnosis not present

## 2021-10-14 DIAGNOSIS — Z8673 Personal history of transient ischemic attack (TIA), and cerebral infarction without residual deficits: Secondary | ICD-10-CM | POA: Diagnosis not present

## 2021-10-14 DIAGNOSIS — N3281 Overactive bladder: Secondary | ICD-10-CM | POA: Diagnosis not present

## 2021-10-14 DIAGNOSIS — E871 Hypo-osmolality and hyponatremia: Secondary | ICD-10-CM | POA: Diagnosis not present

## 2021-10-14 DIAGNOSIS — E1151 Type 2 diabetes mellitus with diabetic peripheral angiopathy without gangrene: Secondary | ICD-10-CM | POA: Diagnosis not present

## 2021-10-14 DIAGNOSIS — K219 Gastro-esophageal reflux disease without esophagitis: Secondary | ICD-10-CM | POA: Diagnosis not present

## 2021-10-14 DIAGNOSIS — E1122 Type 2 diabetes mellitus with diabetic chronic kidney disease: Secondary | ICD-10-CM | POA: Diagnosis not present

## 2021-10-14 DIAGNOSIS — S31000D Unspecified open wound of lower back and pelvis without penetration into retroperitoneum, subsequent encounter: Secondary | ICD-10-CM | POA: Diagnosis not present

## 2021-10-14 DIAGNOSIS — E7800 Pure hypercholesterolemia, unspecified: Secondary | ICD-10-CM | POA: Diagnosis not present

## 2021-10-14 DIAGNOSIS — Z7952 Long term (current) use of systemic steroids: Secondary | ICD-10-CM | POA: Diagnosis not present

## 2021-10-14 DIAGNOSIS — Z7982 Long term (current) use of aspirin: Secondary | ICD-10-CM | POA: Diagnosis not present

## 2021-10-14 DIAGNOSIS — M3390 Dermatopolymyositis, unspecified, organ involvement unspecified: Secondary | ICD-10-CM | POA: Diagnosis not present

## 2021-10-14 DIAGNOSIS — N183 Chronic kidney disease, stage 3 unspecified: Secondary | ICD-10-CM | POA: Diagnosis not present

## 2021-10-14 DIAGNOSIS — Z9181 History of falling: Secondary | ICD-10-CM | POA: Diagnosis not present

## 2021-10-14 DIAGNOSIS — Z794 Long term (current) use of insulin: Secondary | ICD-10-CM | POA: Diagnosis not present

## 2021-10-14 DIAGNOSIS — M19071 Primary osteoarthritis, right ankle and foot: Secondary | ICD-10-CM | POA: Diagnosis not present

## 2021-10-14 LAB — CUP PACEART REMOTE DEVICE CHECK
Date Time Interrogation Session: 20230326230519
Implantable Pulse Generator Implant Date: 20210203

## 2021-10-15 DIAGNOSIS — N3281 Overactive bladder: Secondary | ICD-10-CM | POA: Diagnosis not present

## 2021-10-15 DIAGNOSIS — M4802 Spinal stenosis, cervical region: Secondary | ICD-10-CM | POA: Diagnosis not present

## 2021-10-15 DIAGNOSIS — E1151 Type 2 diabetes mellitus with diabetic peripheral angiopathy without gangrene: Secondary | ICD-10-CM | POA: Diagnosis not present

## 2021-10-15 DIAGNOSIS — Z9181 History of falling: Secondary | ICD-10-CM | POA: Diagnosis not present

## 2021-10-15 DIAGNOSIS — M19071 Primary osteoarthritis, right ankle and foot: Secondary | ICD-10-CM | POA: Diagnosis not present

## 2021-10-15 DIAGNOSIS — R21 Rash and other nonspecific skin eruption: Secondary | ICD-10-CM | POA: Diagnosis not present

## 2021-10-15 DIAGNOSIS — E7800 Pure hypercholesterolemia, unspecified: Secondary | ICD-10-CM | POA: Diagnosis not present

## 2021-10-15 DIAGNOSIS — S31000D Unspecified open wound of lower back and pelvis without penetration into retroperitoneum, subsequent encounter: Secondary | ICD-10-CM | POA: Diagnosis not present

## 2021-10-15 DIAGNOSIS — Z794 Long term (current) use of insulin: Secondary | ICD-10-CM | POA: Diagnosis not present

## 2021-10-15 DIAGNOSIS — M3390 Dermatopolymyositis, unspecified, organ involvement unspecified: Secondary | ICD-10-CM | POA: Diagnosis not present

## 2021-10-15 DIAGNOSIS — I129 Hypertensive chronic kidney disease with stage 1 through stage 4 chronic kidney disease, or unspecified chronic kidney disease: Secondary | ICD-10-CM | POA: Diagnosis not present

## 2021-10-15 DIAGNOSIS — M6281 Muscle weakness (generalized): Secondary | ICD-10-CM | POA: Diagnosis not present

## 2021-10-15 DIAGNOSIS — N183 Chronic kidney disease, stage 3 unspecified: Secondary | ICD-10-CM | POA: Diagnosis not present

## 2021-10-15 DIAGNOSIS — K219 Gastro-esophageal reflux disease without esophagitis: Secondary | ICD-10-CM | POA: Diagnosis not present

## 2021-10-15 DIAGNOSIS — M810 Age-related osteoporosis without current pathological fracture: Secondary | ICD-10-CM | POA: Diagnosis not present

## 2021-10-15 DIAGNOSIS — M19072 Primary osteoarthritis, left ankle and foot: Secondary | ICD-10-CM | POA: Diagnosis not present

## 2021-10-15 DIAGNOSIS — M170 Bilateral primary osteoarthritis of knee: Secondary | ICD-10-CM | POA: Diagnosis not present

## 2021-10-15 DIAGNOSIS — E1122 Type 2 diabetes mellitus with diabetic chronic kidney disease: Secondary | ICD-10-CM | POA: Diagnosis not present

## 2021-10-15 DIAGNOSIS — Z8673 Personal history of transient ischemic attack (TIA), and cerebral infarction without residual deficits: Secondary | ICD-10-CM | POA: Diagnosis not present

## 2021-10-15 DIAGNOSIS — E039 Hypothyroidism, unspecified: Secondary | ICD-10-CM | POA: Diagnosis not present

## 2021-10-15 DIAGNOSIS — Z7982 Long term (current) use of aspirin: Secondary | ICD-10-CM | POA: Diagnosis not present

## 2021-10-15 DIAGNOSIS — E871 Hypo-osmolality and hyponatremia: Secondary | ICD-10-CM | POA: Diagnosis not present

## 2021-10-15 DIAGNOSIS — Z7952 Long term (current) use of systemic steroids: Secondary | ICD-10-CM | POA: Diagnosis not present

## 2021-10-16 DIAGNOSIS — L603 Nail dystrophy: Secondary | ICD-10-CM | POA: Diagnosis not present

## 2021-10-16 DIAGNOSIS — L84 Corns and callosities: Secondary | ICD-10-CM | POA: Diagnosis not present

## 2021-10-16 DIAGNOSIS — E1151 Type 2 diabetes mellitus with diabetic peripheral angiopathy without gangrene: Secondary | ICD-10-CM | POA: Diagnosis not present

## 2021-10-16 DIAGNOSIS — I739 Peripheral vascular disease, unspecified: Secondary | ICD-10-CM | POA: Diagnosis not present

## 2021-10-20 DIAGNOSIS — R54 Age-related physical debility: Secondary | ICD-10-CM | POA: Diagnosis not present

## 2021-10-20 DIAGNOSIS — Z993 Dependence on wheelchair: Secondary | ICD-10-CM | POA: Diagnosis not present

## 2021-10-23 NOTE — Progress Notes (Signed)
Carelink Summary Report / Loop Recorder 

## 2021-11-04 DIAGNOSIS — Z1389 Encounter for screening for other disorder: Secondary | ICD-10-CM | POA: Diagnosis not present

## 2021-11-04 DIAGNOSIS — Z1331 Encounter for screening for depression: Secondary | ICD-10-CM | POA: Diagnosis not present

## 2021-11-04 DIAGNOSIS — E039 Hypothyroidism, unspecified: Secondary | ICD-10-CM | POA: Diagnosis not present

## 2021-11-04 DIAGNOSIS — E782 Mixed hyperlipidemia: Secondary | ICD-10-CM | POA: Diagnosis not present

## 2021-11-04 DIAGNOSIS — I700 Atherosclerosis of aorta: Secondary | ICD-10-CM | POA: Diagnosis not present

## 2021-11-04 DIAGNOSIS — M3390 Dermatopolymyositis, unspecified, organ involvement unspecified: Secondary | ICD-10-CM | POA: Diagnosis not present

## 2021-11-04 DIAGNOSIS — E222 Syndrome of inappropriate secretion of antidiuretic hormone: Secondary | ICD-10-CM | POA: Diagnosis not present

## 2021-11-04 DIAGNOSIS — I129 Hypertensive chronic kidney disease with stage 1 through stage 4 chronic kidney disease, or unspecified chronic kidney disease: Secondary | ICD-10-CM | POA: Diagnosis not present

## 2021-11-04 DIAGNOSIS — Z0000 Encounter for general adult medical examination without abnormal findings: Secondary | ICD-10-CM | POA: Diagnosis not present

## 2021-11-04 DIAGNOSIS — E1142 Type 2 diabetes mellitus with diabetic polyneuropathy: Secondary | ICD-10-CM | POA: Diagnosis not present

## 2021-11-12 DIAGNOSIS — M3390 Dermatopolymyositis, unspecified, organ involvement unspecified: Secondary | ICD-10-CM | POA: Diagnosis not present

## 2021-11-16 LAB — CUP PACEART REMOTE DEVICE CHECK
Date Time Interrogation Session: 20230428230104
Implantable Pulse Generator Implant Date: 20210203

## 2021-11-17 ENCOUNTER — Ambulatory Visit (INDEPENDENT_AMBULATORY_CARE_PROVIDER_SITE_OTHER): Payer: Medicare HMO

## 2021-11-17 DIAGNOSIS — I6389 Other cerebral infarction: Secondary | ICD-10-CM

## 2021-11-19 DIAGNOSIS — Z993 Dependence on wheelchair: Secondary | ICD-10-CM | POA: Diagnosis not present

## 2021-11-19 DIAGNOSIS — R54 Age-related physical debility: Secondary | ICD-10-CM | POA: Diagnosis not present

## 2021-11-21 ENCOUNTER — Encounter (HOSPITAL_COMMUNITY): Payer: Self-pay | Admitting: Emergency Medicine

## 2021-11-21 ENCOUNTER — Emergency Department (HOSPITAL_COMMUNITY)
Admission: EM | Admit: 2021-11-21 | Discharge: 2021-11-21 | Disposition: A | Discharge status: Home / Self Care | Payer: Medicare HMO | Attending: Emergency Medicine | Admitting: Emergency Medicine

## 2021-11-21 ENCOUNTER — Emergency Department (HOSPITAL_COMMUNITY): Payer: Medicare HMO

## 2021-11-21 ENCOUNTER — Other Ambulatory Visit: Payer: Self-pay

## 2021-11-21 DIAGNOSIS — E039 Hypothyroidism, unspecified: Secondary | ICD-10-CM | POA: Insufficient documentation

## 2021-11-21 DIAGNOSIS — R4182 Altered mental status, unspecified: Secondary | ICD-10-CM | POA: Diagnosis not present

## 2021-11-21 DIAGNOSIS — E860 Dehydration: Secondary | ICD-10-CM | POA: Insufficient documentation

## 2021-11-21 DIAGNOSIS — R197 Diarrhea, unspecified: Secondary | ICD-10-CM | POA: Insufficient documentation

## 2021-11-21 DIAGNOSIS — R0902 Hypoxemia: Secondary | ICD-10-CM | POA: Diagnosis not present

## 2021-11-21 DIAGNOSIS — E1122 Type 2 diabetes mellitus with diabetic chronic kidney disease: Secondary | ICD-10-CM | POA: Diagnosis not present

## 2021-11-21 DIAGNOSIS — R509 Fever, unspecified: Secondary | ICD-10-CM | POA: Diagnosis not present

## 2021-11-21 DIAGNOSIS — R5383 Other fatigue: Secondary | ICD-10-CM | POA: Diagnosis not present

## 2021-11-21 DIAGNOSIS — I129 Hypertensive chronic kidney disease with stage 1 through stage 4 chronic kidney disease, or unspecified chronic kidney disease: Secondary | ICD-10-CM | POA: Insufficient documentation

## 2021-11-21 DIAGNOSIS — R739 Hyperglycemia, unspecified: Secondary | ICD-10-CM | POA: Diagnosis not present

## 2021-11-21 DIAGNOSIS — I959 Hypotension, unspecified: Secondary | ICD-10-CM | POA: Diagnosis not present

## 2021-11-21 DIAGNOSIS — Z20822 Contact with and (suspected) exposure to covid-19: Secondary | ICD-10-CM | POA: Diagnosis not present

## 2021-11-21 DIAGNOSIS — N183 Chronic kidney disease, stage 3 unspecified: Secondary | ICD-10-CM | POA: Diagnosis not present

## 2021-11-21 DIAGNOSIS — Z743 Need for continuous supervision: Secondary | ICD-10-CM | POA: Diagnosis not present

## 2021-11-21 LAB — URINALYSIS, ROUTINE W REFLEX MICROSCOPIC
Bilirubin Urine: NEGATIVE
Glucose, UA: NEGATIVE mg/dL
Hgb urine dipstick: NEGATIVE
Ketones, ur: 5 mg/dL — AB
Leukocytes,Ua: NEGATIVE
Nitrite: NEGATIVE
Protein, ur: 100 mg/dL — AB
Specific Gravity, Urine: 1.015 (ref 1.005–1.030)
pH: 5 (ref 5.0–8.0)

## 2021-11-21 LAB — CBC WITH DIFFERENTIAL/PLATELET
Abs Immature Granulocytes: 0.12 10*3/uL — ABNORMAL HIGH (ref 0.00–0.07)
Basophils Absolute: 0 10*3/uL (ref 0.0–0.1)
Basophils Relative: 0 %
Eosinophils Absolute: 0.3 10*3/uL (ref 0.0–0.5)
Eosinophils Relative: 4 %
HCT: 26.2 % — ABNORMAL LOW (ref 36.0–46.0)
Hemoglobin: 8.9 g/dL — ABNORMAL LOW (ref 12.0–15.0)
Immature Granulocytes: 1 %
Lymphocytes Relative: 3 %
Lymphs Abs: 0.3 10*3/uL — ABNORMAL LOW (ref 0.7–4.0)
MCH: 35.7 pg — ABNORMAL HIGH (ref 26.0–34.0)
MCHC: 34 g/dL (ref 30.0–36.0)
MCV: 105.2 fL — ABNORMAL HIGH (ref 80.0–100.0)
Monocytes Absolute: 0.6 10*3/uL (ref 0.1–1.0)
Monocytes Relative: 6 %
Neutro Abs: 8 10*3/uL — ABNORMAL HIGH (ref 1.7–7.7)
Neutrophils Relative %: 86 %
Platelets: 249 10*3/uL (ref 150–400)
RBC: 2.49 MIL/uL — ABNORMAL LOW (ref 3.87–5.11)
RDW: 13.4 % (ref 11.5–15.5)
WBC: 9.3 10*3/uL (ref 4.0–10.5)
nRBC: 0 % (ref 0.0–0.2)

## 2021-11-21 LAB — COMPREHENSIVE METABOLIC PANEL
ALT: 61 U/L — ABNORMAL HIGH (ref 0–44)
AST: 77 U/L — ABNORMAL HIGH (ref 15–41)
Albumin: 2.8 g/dL — ABNORMAL LOW (ref 3.5–5.0)
Alkaline Phosphatase: 72 U/L (ref 38–126)
Anion gap: 7 (ref 5–15)
BUN: 27 mg/dL — ABNORMAL HIGH (ref 8–23)
CO2: 26 mmol/L (ref 22–32)
Calcium: 9.2 mg/dL (ref 8.9–10.3)
Chloride: 102 mmol/L (ref 98–111)
Creatinine, Ser: 0.9 mg/dL (ref 0.44–1.00)
GFR, Estimated: >60 mL/min (ref >60)
Glucose, Bld: 208 mg/dL — ABNORMAL HIGH (ref 70–99)
Potassium: 4.1 mmol/L (ref 3.5–5.1)
Sodium: 135 mmol/L (ref 135–145)
Total Bilirubin: 0.6 mg/dL (ref 0.3–1.2)
Total Protein: 5.9 g/dL — ABNORMAL LOW (ref 6.5–8.1)

## 2021-11-21 LAB — RESP PANEL BY RT-PCR (FLU A&B, COVID) ARPGX2
Influenza A by PCR: NEGATIVE
Influenza B by PCR: NEGATIVE
SARS Coronavirus 2 by RT PCR: NEGATIVE

## 2021-11-21 LAB — LIPASE, BLOOD: Lipase: 63 U/L — ABNORMAL HIGH (ref 11–51)

## 2021-11-21 LAB — CBG MONITORING, ED: Glucose-Capillary: 244 mg/dL — ABNORMAL HIGH (ref 70–99)

## 2021-11-21 MED ORDER — NYSTATIN 100000 UNIT/ML MT SUSP: 500000.0000 [IU] | Freq: Four times a day (QID) | OROMUCOSAL | 0 refills | Status: DC

## 2021-11-21 MED ORDER — SODIUM CHLORIDE 0.9 % IV BOLUS
500.0000 mL | Freq: Once | INTRAVENOUS | Status: AC
  Administered 2021-11-21: 500 mL via INTRAVENOUS

## 2021-11-21 NOTE — ED Provider Notes (Signed)
? ?Emergency Department Provider Note ? ? ?I have reviewed the triage vital signs and the nursing notes. ? ? ?HISTORY ? ?Chief Complaint ?Fatigue and Diarrhea ? ? ?HPI ?[name] [name] is a 88 y.o. female with past medical history reviewed below including chronic hyponatremia, polymyositis on methotrexate, diabetes presents to the emergency department with altered mental status.  Patient arrives with her family member who states she seemed more confused in the past 2 days.  She has had several episodes of diarrhea without abdominal pain or fever this week which typically leads to low sodium.  The patient has not been as conversational and somewhat confused with trying to use her walker which is not usual for her.  She has been ambulatory.  She continues to eat and drink.  She had multiple episodes of nonbloody/nonblack diarrhea on Tuesday.  Diarrhea has continued this week although not as frequent.  Patient denies any chest or abdominal pain. No falls.  ? ? ?Past Medical History:  ?Diagnosis Date  ? Arthritis   ? "knees, legs" (06/10/2016)  ? Ascites   ? Chronic kidney disease   ? "related to my diabetes"  ? Chronic lower back pain   ? Diabetes mellitus without complication ([place])   ? Eczema   ? GERD (gastroesophageal reflux disease)   ? High cholesterol   ? Hyperlipidemia   ? Hypertension   ? Hypothyroidism   ? OAB (overactive bladder)   ? Osteoporosis   ? Thyroid disease   ? Type II diabetes mellitus ([place])   ? Urticaria   ? ? ?Review of Systems ? ?Constitutional: No fever/chills. Positive weakness.  ?Cardiovascular: Denies chest pain. ?Respiratory: Denies shortness of breath. ?Gastrointestinal: No abdominal pain.  No nausea, no vomiting. Positive diarrhea.  No constipation. ?Genitourinary: Negative for dysuria. ?Musculoskeletal: Negative for back pain. ?Skin: Negative for rash. ?Neurological: Negative for headaches. ? ? ?____________________________________________ ? ? ?PHYSICAL EXAM: ? ?VITAL SIGNS: ?ED Triage  Vitals  ?Enc Vitals Group  ?   BP 11/21/21 1305 (!) 136/54  ?   Pulse Rate 11/21/21 1305 72  ?   Resp 11/21/21 1305 17  ?   Temp 11/21/21 1306 99.1 ?F (37.3 ?C)  ?   Temp Source 11/21/21 1306 Oral  ?   SpO2 11/21/21 1305 97 %  ? ?Constitutional: Alert with mild confusion. No distress.  ?Eyes: Conjunctivae are normal.  ?Head: Atraumatic. ?Nose: No congestion/rhinnorhea. ?Mouth/Throat: Mucous membranes are moist. Thrush on exam.  ?Neck: No stridor.   ?Cardiovascular: Normal rate, regular rhythm. Good peripheral circulation. Grossly normal heart sounds.   ?Respiratory: Normal respiratory effort.  No retractions. Lungs CTAB. ?Gastrointestinal: Soft and nontender. No distention.  ?Musculoskeletal: No lower extremity tenderness nor edema. No gross deformities of extremities. ?Neurologic:  Normal speech and language. No gross focal neurologic deficits are appreciated.  ?Skin:  Skin is warm and dry. Slight abrasion to the left knee.  ? ? ?____________________________________________ ?  ?LABS ?(all labs ordered are listed, but only abnormal results are displayed) ? ?Labs Reviewed  ?COMPREHENSIVE METABOLIC PANEL - Abnormal; Notable for the following components:  ?    Result Value  ? Glucose, Bld 208 (*)   ? BUN 27 (*)   ? Total Protein 5.9 (*)   ? Albumin 2.8 (*)   ? AST 77 (*)   ? ALT 61 (*)   ? All other components within normal limits  ?LIPASE, BLOOD - Abnormal; Notable for the following components:  ? Lipase 63 (*)   ?  All other components within normal limits  ?CBC WITH DIFFERENTIAL/PLATELET - Abnormal; Notable for the following components:  ? RBC 2.49 (*)   ? Hemoglobin 8.9 (*)   ? HCT 26.2 (*)   ? MCV 105.2 (*)   ? MCH 35.7 (*)   ? Neutro Abs 8.0 (*)   ? Lymphs Abs 0.3 (*)   ? Abs Immature Granulocytes 0.12 (*)   ? All other components within normal limits  ?URINALYSIS, ROUTINE W REFLEX MICROSCOPIC - Abnormal; Notable for the following components:  ? APPearance HAZY (*)   ? Ketones, ur 5 (*)   ? Protein, ur 100 (*)   ?  Bacteria, UA RARE (*)   ? All other components within normal limits  ?CBG MONITORING, ED - Abnormal; Notable for the following components:  ? Glucose-Capillary 244 (*)   ? All other components within normal limits  ?RESP PANEL BY RT-PCR (FLU A&B, COVID) [id number]  ?URINE CULTURE  ? ?____________________________________________ ? ?EKG ? ? EKG Interpretation ? ?Date/Time:  Friday Nov 21 2021 13:05:15 EDT ?Ventricular Rate:  73 ?PR Interval:  184 ?QRS Duration: 92 ?QT Interval:  367 ?QTC Calculation: 405 ?R Axis:   38 ?Text Interpretation: Sinus rhythm Baseline wander in lead(s) I III aVL Confirmed by Nanda Quinton [phone]) on 11/21/2021 3:14:56 PM ?  ? ?  ? ? ?____________________________________________ ? ?RADIOLOGY ? ?CT Head Wo Contrast ? ?Result Date: 11/21/2021 ?CLINICAL DATA:  Mental status change EXAM: CT HEAD WITHOUT CONTRAST TECHNIQUE: Contiguous axial images were obtained from the base of the skull through the vertex without intravenous contrast. RADIATION DOSE REDUCTION: This exam was performed according to the departmental dose-optimization program which includes automated exposure control, adjustment of the mA and/or kV according to patient size and/or use of iterative reconstruction technique. COMPARISON:  Head CT dated September 13, 2021 FINDINGS: Brain: No acute intracranial abnormality. 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 No evidence of acute infarction, hemorrhage, hydrocephalus, extra-axial collection or mass lesion/mass effect. Vascular: No hyperdense vessel or unexpected calcification. Skull: Normal. Negative for fracture or focal lesion. Sinuses/Orbits: No acute finding. Other: None. IMPRESSION: No acute intracranial abnormality. Electronically Signed   By: Yetta Glassman M.D.   On: 11/21/2021 15:55   ? ?____________________________________________ ? ? ?PROCEDURES ? ?Procedure(s) performed:  ? ?Procedures ? ?None ?____________________________________________ ? ? ?INITIAL IMPRESSION / ASSESSMENT  AND PLAN / ED COURSE ? ?Pertinent labs & imaging results that were available during my care of the patient were reviewed by me and considered in my medical decision making (see chart for details). ?  ?This patient is Presenting for Evaluation of AMS, which does require a range of treatment options, and is a complaint that involves a high risk of morbidity and mortality. ? ?The Differential Diagnoses  includes but is not exclusive to alcohol, illicit or prescription medications, intracranial pathology such as stroke, intracerebral hemorrhage, fever or infectious causes including sepsis, hypoxemia, uremia, trauma, endocrine related disorders such as diabetes, hypoglycemia, thyroid-related diseases, etc. ? ? ?Critical Interventions-  ?  ?Medications  ?sodium chloride 0.9 % bolus 500 mL (0 mLs Intravenous Stopped 11/21/21 1742)  ? ? ?Reassessment after intervention:  Patient remains awake and alert.  ? ? ?I did obtain Additional Historical Information from family at bedside, as the patient is altered. ? ?I decided to review pertinent External Data, and in summary patient with prior admit for hyponatremia this year with a similar presentation. ?  ?Clinical Laboratory Tests Ordered, included sodium is within normal limits at 135.  No acidemia or anion gap.  LFTs are slightly elevated.  No leukocytosis.  Mild anemia noted. ? ?Radiologic Tests Ordered, CT head pending.  ? ?Cardiac Monitor Tracing which shows NSR. ? ? ?Social Determinants of Health Risk patient lives at home with family caring for the patient.  ? ?Medical Decision Making: Summary:  ?Patient presents emergency department with altered mental status, thought by the family to be related to hyponatremia.  Patient's sodium here is actually within normal limits.  UA is pending along with a CT head which was added after sodium came back normal.  No focal deficits to strongly suspect stroke.  ? ?Reevaluation with update and discussion with patient and daughter at  bedside. Care transferred to Dr. Roderic Palau. Na is WNL. Will look for other cause of AMS. UA is pending and have added CT head.  ? ?Disposition: pending ? ?____________________________________________ ? ?FINAL CL

## 2021-11-21 NOTE — ED Triage Notes (Signed)
Patient presents due to worsening diarrhea, lethargy and fatigue for the last few days. With past episodes like this, these symptoms were an indication of low sodium.  A&O per EMS. EMS administer 450 ml of fluid.  ? ?EMS vitals: ?106 HR ?146/58 BP ?20RR ?255 CBG ?35 Cap ?102.4 Temp ? ? ?

## 2021-11-21 NOTE — Discharge Instructions (Signed)
Follow-up with your doctor next week if not improving.  Have the patient drink plenty of fluids especially if she keeps having diarrhea ?

## 2021-11-23 LAB — URINE CULTURE: Culture: >=100000 — AB

## 2021-11-24 ENCOUNTER — Telehealth: Payer: Self-pay | Admitting: Emergency Medicine

## 2021-11-24 NOTE — Progress Notes (Signed)
ED Antimicrobial Stewardship Positive Culture Follow Up  ? ?AMILIA VANDENBRINK is an 88 y.o. female who presented to [facility] on 11/21/2021 with a chief complaint of  ?Chief Complaint  ?Patient presents with  ? Fatigue  ? Diarrhea  ? ? ?Recent Results (from the past 720 hour(s))  ?Urine Culture     Status: Abnormal  ? Collection Time: 11/21/21  3:52 PM  ? Specimen: Urine, Clean Catch  ?Result Value Ref Range Status  ? Specimen Description   Final  ?  URINE, CLEAN CATCH ?Performed at [facility], [place] [address], [place], [place] [address] ?  ? Special Requests   Final  ?  NONE ?Performed at [facility], [place] [address] [place], [place] [address] ?  ? Culture >=100,000 COLONIES/mL ENTEROCOCCUS FAECALIS (A)  Final  ? Report Status 11/23/2021 FINAL  Final  ? Organism ID, Bacteria ENTEROCOCCUS FAECALIS (A)  Final  ?    Susceptibility  ? Enterococcus faecalis - MIC*  ?  AMPICILLIN <=2 SENSITIVE Sensitive   ?  NITROFURANTOIN <=16 SENSITIVE Sensitive   ?  VANCOMYCIN 2 SENSITIVE Sensitive   ?  * >=100,000 COLONIES/mL ENTEROCOCCUS FAECALIS  ?Resp Panel by RT-PCR (Flu A&B, Covid)     Status: None  ? Collection Time: 11/21/21  3:52 PM  ? Specimen: Nasopharyngeal(NP) swabs in vial transport medium  ?Result Value Ref Range Status  ? SARS Coronavirus 2 by RT PCR NEGATIVE NEGATIVE Final  ?  Comment: (NOTE) ?SARS-CoV-2 target nucleic acids are NOT DETECTED. ? ?The SARS-CoV-2 RNA is generally detectable in upper respiratory ?specimens during the acute phase of infection. The lowest ?concentration of SARS-CoV-2 viral copies this assay can detect is ?138 copies/mL. A negative result does not preclude SARS-Cov-2 ?infection and should not be used as the sole basis for treatment or ?other patient management decisions. A negative result may occur with  ?improper specimen collection/handling, submission of specimen other ?than nasopharyngeal swab, presence of viral mutation(s) within the ?areas targeted  by this assay, and inadequate number of viral ?copies(<138 copies/mL). A negative result must be combined with ?clinical observations, patient history, and epidemiological ?information. The expected result is Negative. ? ?Fact Sheet for Patients:  ?EntrepreneurPulse.com.au ? ?Fact Sheet for Healthcare Providers:  ?IncredibleEmployment.be ? ?This test is no t yet approved or cleared by the [place] FDA and  ?has been authorized for detection and/or diagnosis of SARS-CoV-2 by ?FDA under an Emergency Use Authorization (EUA). This EUA will remain  ?in effect (meaning this test can be used) for the duration of the ?COVID-19 declaration under Section 564(b)(1) of the Act, 21 ?U.S.C.section 360bbb-3(b)(1), unless the authorization is terminated  ?or revoked sooner.  ? ? ?  ? Influenza A by PCR NEGATIVE NEGATIVE Final  ? Influenza B by PCR NEGATIVE NEGATIVE Final  ?  Comment: (NOTE) ?The Xpert Xpress SARS-CoV-2/FLU/RSV plus assay is intended as an aid ?in the diagnosis of influenza from Nasopharyngeal swab specimens and ?should not be used as a sole basis for treatment. Nasal washings and ?aspirates are unacceptable for Xpert Xpress SARS-CoV-2/FLU/RSV ?testing. ? ?Fact Sheet for Patients: ?EntrepreneurPulse.com.au ? ?Fact Sheet for Healthcare Providers: ?IncredibleEmployment.be ? ?This test is not yet approved or cleared by the [place] FDA and ?has been authorized for detection and/or diagnosis of SARS-CoV-2 by ?FDA under an Emergency Use Authorization (EUA). This EUA will remain ?in effect (meaning this test can be used) for the duration of the ?COVID-19 declaration under Section 564(b)(1) of the  Act, 21 U.S.C. ?section 360bbb-3(b)(1), unless the authorization is terminated or ?revoked. ? ?Performed at [facility], [place] ?[place], [place] [address] ?  ? ?89 yof altered w/ diarrhea, denies abd pain, no s/sx UTI,  AMS likely d/t dehydration ? ?'[x]'$  Patient discharged originally without antimicrobial agent and treatment is not indicated ? ?New antibiotic prescription: None ? ?ED Provider: Myna Bright PA-C ? ? ?Urias Sheek ?11/24/2021, 8:38 AM ?PharmD Candidate ?  ?

## 2021-11-24 NOTE — Telephone Encounter (Signed)
Post ED Visit - Positive Culture Follow-up ? ?Culture report reviewed by antimicrobial stewardship pharmacist: ?[place] Team ?'[]'$  Elenor Quinones, Pharm.D. ?'[]'$  Heide Guile, Pharm.D., BCPS AQ-ID ?'[]'$  Parks Neptune, Pharm.D., BCPS ?'[x]'$  Alycia Rossetti, Pharm.D., BCPS ?'[]'$  [place], Pharm.D., BCPS, AAHIVP ?'[]'$  Legrand Como, Pharm.D., BCPS, AAHIVP ?'[]'$  Salome Arnt, PharmD, BCPS ?'[]'$  Johnnette Gourd, PharmD, BCPS ?'[]'$  Hughes Better, PharmD, BCPS ?'[]'$  Leeroy Cha, PharmD ?'[]'$  Laqueta Linden, PharmD, BCPS ?'[]'$  Albertina Parr, PharmD ? ?[place] Team ?'[]'$  Leodis Sias, PharmD ?'[]'$  Lindell Spar, PharmD ?'[]'$  Royetta Asal, PharmD ?'[]'$  Graylin Shiver, Rph ?'[]'$  Rema Fendt) Glennon Mac, PharmD ?'[]'$  Arlyn Dunning, PharmD ?'[]'$  Netta Cedars, PharmD ?'[]'$  Dia Sitter, PharmD ?'[]'$  Leone Haven, PharmD ?'[]'$  Gretta Arab, PharmD ?'[]'$  Theodis Shove, PharmD ?'[]'$  Peggyann Juba, PharmD ?'[]'$  Reuel Boom, PharmD ? ? ?Positive urine culture ?Treated with none, asymptomatic,no further patient follow-up is required at this time. ? ?Hazle Nordmann ?11/24/2021, 9:58 AM ?  ?

## 2021-12-02 DIAGNOSIS — E1142 Type 2 diabetes mellitus with diabetic polyneuropathy: Secondary | ICD-10-CM | POA: Diagnosis not present

## 2021-12-02 DIAGNOSIS — N182 Chronic kidney disease, stage 2 (mild): Secondary | ICD-10-CM | POA: Diagnosis not present

## 2021-12-02 NOTE — Progress Notes (Signed)
Carelink Summary Report / [email or web address] 

## 2021-12-20 DIAGNOSIS — R54 Age-related physical debility: Secondary | ICD-10-CM | POA: Diagnosis not present

## 2021-12-20 DIAGNOSIS — Z993 Dependence on wheelchair: Secondary | ICD-10-CM | POA: Diagnosis not present

## 2021-12-22 ENCOUNTER — Ambulatory Visit (INDEPENDENT_AMBULATORY_CARE_PROVIDER_SITE_OTHER): Payer: Medicare HMO

## 2021-12-22 DIAGNOSIS — I6389 Other cerebral infarction: Secondary | ICD-10-CM

## 2021-12-22 LAB — CUP PACEART REMOTE DEVICE CHECK
Date Time Interrogation Session: 20230531230226
Implantable Pulse Generator Implant Date: 20210203

## 2021-12-29 ENCOUNTER — Telehealth: Payer: Self-pay

## 2021-12-29 DIAGNOSIS — E782 Mixed hyperlipidemia: Secondary | ICD-10-CM | POA: Diagnosis not present

## 2021-12-29 DIAGNOSIS — M3390 Dermatopolymyositis, unspecified, organ involvement unspecified: Secondary | ICD-10-CM | POA: Diagnosis not present

## 2021-12-29 NOTE — Telephone Encounter (Signed)
I let the patient daughter Percell Miller know that the patient has loop last for 4 years. I told her once the battery reached RRT we will give them a call to let them know. Once we call them we will give them the option to have it removed or leave it in. Carmelo thanked me for the call.

## 2021-12-30 DIAGNOSIS — Z794 Long term (current) use of insulin: Secondary | ICD-10-CM | POA: Diagnosis not present

## 2021-12-30 DIAGNOSIS — E1165 Type 2 diabetes mellitus with hyperglycemia: Secondary | ICD-10-CM | POA: Diagnosis not present

## 2021-12-30 DIAGNOSIS — E119 Type 2 diabetes mellitus without complications: Secondary | ICD-10-CM | POA: Diagnosis not present

## 2021-12-30 DIAGNOSIS — E1140 Type 2 diabetes mellitus with diabetic neuropathy, unspecified: Secondary | ICD-10-CM | POA: Diagnosis not present

## 2021-12-31 DIAGNOSIS — E119 Type 2 diabetes mellitus without complications: Secondary | ICD-10-CM | POA: Diagnosis not present

## 2021-12-31 DIAGNOSIS — E1165 Type 2 diabetes mellitus with hyperglycemia: Secondary | ICD-10-CM | POA: Diagnosis not present

## 2021-12-31 DIAGNOSIS — Z794 Long term (current) use of insulin: Secondary | ICD-10-CM | POA: Diagnosis not present

## 2021-12-31 DIAGNOSIS — E1140 Type 2 diabetes mellitus with diabetic neuropathy, unspecified: Secondary | ICD-10-CM | POA: Diagnosis not present

## 2022-01-01 DIAGNOSIS — E1165 Type 2 diabetes mellitus with hyperglycemia: Secondary | ICD-10-CM | POA: Diagnosis not present

## 2022-01-01 DIAGNOSIS — E1140 Type 2 diabetes mellitus with diabetic neuropathy, unspecified: Secondary | ICD-10-CM | POA: Diagnosis not present

## 2022-01-01 DIAGNOSIS — Z794 Long term (current) use of insulin: Secondary | ICD-10-CM | POA: Diagnosis not present

## 2022-01-01 DIAGNOSIS — E119 Type 2 diabetes mellitus without complications: Secondary | ICD-10-CM | POA: Diagnosis not present

## 2022-01-02 DIAGNOSIS — E1165 Type 2 diabetes mellitus with hyperglycemia: Secondary | ICD-10-CM | POA: Diagnosis not present

## 2022-01-02 DIAGNOSIS — Z794 Long term (current) use of insulin: Secondary | ICD-10-CM | POA: Diagnosis not present

## 2022-01-02 DIAGNOSIS — E119 Type 2 diabetes mellitus without complications: Secondary | ICD-10-CM | POA: Diagnosis not present

## 2022-01-02 DIAGNOSIS — E1140 Type 2 diabetes mellitus with diabetic neuropathy, unspecified: Secondary | ICD-10-CM | POA: Diagnosis not present

## 2022-01-03 DIAGNOSIS — Z794 Long term (current) use of insulin: Secondary | ICD-10-CM | POA: Diagnosis not present

## 2022-01-03 DIAGNOSIS — E1140 Type 2 diabetes mellitus with diabetic neuropathy, unspecified: Secondary | ICD-10-CM | POA: Diagnosis not present

## 2022-01-03 DIAGNOSIS — E119 Type 2 diabetes mellitus without complications: Secondary | ICD-10-CM | POA: Diagnosis not present

## 2022-01-03 DIAGNOSIS — E1165 Type 2 diabetes mellitus with hyperglycemia: Secondary | ICD-10-CM | POA: Diagnosis not present

## 2022-01-04 DIAGNOSIS — E1165 Type 2 diabetes mellitus with hyperglycemia: Secondary | ICD-10-CM | POA: Diagnosis not present

## 2022-01-04 DIAGNOSIS — E119 Type 2 diabetes mellitus without complications: Secondary | ICD-10-CM | POA: Diagnosis not present

## 2022-01-04 DIAGNOSIS — Z794 Long term (current) use of insulin: Secondary | ICD-10-CM | POA: Diagnosis not present

## 2022-01-04 DIAGNOSIS — E1140 Type 2 diabetes mellitus with diabetic neuropathy, unspecified: Secondary | ICD-10-CM | POA: Diagnosis not present

## 2022-01-05 DIAGNOSIS — E119 Type 2 diabetes mellitus without complications: Secondary | ICD-10-CM | POA: Diagnosis not present

## 2022-01-05 DIAGNOSIS — E1165 Type 2 diabetes mellitus with hyperglycemia: Secondary | ICD-10-CM | POA: Diagnosis not present

## 2022-01-05 DIAGNOSIS — E1140 Type 2 diabetes mellitus with diabetic neuropathy, unspecified: Secondary | ICD-10-CM | POA: Diagnosis not present

## 2022-01-05 DIAGNOSIS — Z794 Long term (current) use of insulin: Secondary | ICD-10-CM | POA: Diagnosis not present

## 2022-01-06 DIAGNOSIS — Z794 Long term (current) use of insulin: Secondary | ICD-10-CM | POA: Diagnosis not present

## 2022-01-06 DIAGNOSIS — E1165 Type 2 diabetes mellitus with hyperglycemia: Secondary | ICD-10-CM | POA: Diagnosis not present

## 2022-01-06 DIAGNOSIS — R21 Rash and other nonspecific skin eruption: Secondary | ICD-10-CM | POA: Diagnosis not present

## 2022-01-06 DIAGNOSIS — M1991 Primary osteoarthritis, unspecified site: Secondary | ICD-10-CM | POA: Diagnosis not present

## 2022-01-06 DIAGNOSIS — M6281 Muscle weakness (generalized): Secondary | ICD-10-CM | POA: Diagnosis not present

## 2022-01-06 DIAGNOSIS — M4802 Spinal stenosis, cervical region: Secondary | ICD-10-CM | POA: Diagnosis not present

## 2022-01-06 DIAGNOSIS — E1140 Type 2 diabetes mellitus with diabetic neuropathy, unspecified: Secondary | ICD-10-CM | POA: Diagnosis not present

## 2022-01-06 DIAGNOSIS — M3390 Dermatopolymyositis, unspecified, organ involvement unspecified: Secondary | ICD-10-CM | POA: Diagnosis not present

## 2022-01-06 DIAGNOSIS — Z681 Body mass index (BMI) 19 or less, adult: Secondary | ICD-10-CM | POA: Diagnosis not present

## 2022-01-06 DIAGNOSIS — R3911 Hesitancy of micturition: Secondary | ICD-10-CM | POA: Diagnosis not present

## 2022-01-06 DIAGNOSIS — E119 Type 2 diabetes mellitus without complications: Secondary | ICD-10-CM | POA: Diagnosis not present

## 2022-01-08 NOTE — Progress Notes (Signed)
Carelink Summary Report / Loop Recorder 

## 2022-01-09 ENCOUNTER — Encounter (HOSPITAL_COMMUNITY): Payer: Self-pay

## 2022-01-09 ENCOUNTER — Emergency Department (HOSPITAL_COMMUNITY)
Admission: EM | Admit: 2022-01-09 | Discharge: 2022-01-09 | Disposition: A | Discharge status: Home / Self Care | Payer: Medicare HMO | Attending: Emergency Medicine | Admitting: Emergency Medicine

## 2022-01-09 DIAGNOSIS — R55 Syncope and collapse: Secondary | ICD-10-CM | POA: Diagnosis not present

## 2022-01-09 DIAGNOSIS — E875 Hyperkalemia: Secondary | ICD-10-CM | POA: Insufficient documentation

## 2022-01-09 DIAGNOSIS — E860 Dehydration: Secondary | ICD-10-CM

## 2022-01-09 DIAGNOSIS — Z743 Need for continuous supervision: Secondary | ICD-10-CM | POA: Diagnosis not present

## 2022-01-09 DIAGNOSIS — Z794 Long term (current) use of insulin: Secondary | ICD-10-CM | POA: Insufficient documentation

## 2022-01-09 LAB — BASIC METABOLIC PANEL
Anion gap: 11 (ref 5–15)
BUN: 36 mg/dL — ABNORMAL HIGH (ref 8–23)
CO2: 24 mmol/L (ref 22–32)
Calcium: 10 mg/dL (ref 8.9–10.3)
Chloride: 95 mmol/L — ABNORMAL LOW (ref 98–111)
Creatinine, Ser: 1.04 mg/dL — ABNORMAL HIGH (ref 0.44–1.00)
GFR, Estimated: 53 mL/min — ABNORMAL LOW (ref >60)
Glucose, Bld: 152 mg/dL — ABNORMAL HIGH (ref 70–99)
Potassium: 4.6 mmol/L (ref 3.5–5.1)
Sodium: 130 mmol/L — ABNORMAL LOW (ref 135–145)

## 2022-01-09 LAB — CBC WITH DIFFERENTIAL/PLATELET
Abs Immature Granulocytes: 0.26 10*3/uL — ABNORMAL HIGH (ref 0.00–0.07)
Basophils Absolute: 0 10*3/uL (ref 0.0–0.1)
Basophils Relative: 0 %
Eosinophils Absolute: 0 10*3/uL (ref 0.0–0.5)
Eosinophils Relative: 0 %
HCT: 29.6 % — ABNORMAL LOW (ref 36.0–46.0)
Hemoglobin: 9.8 g/dL — ABNORMAL LOW (ref 12.0–15.0)
Immature Granulocytes: 4 %
Lymphocytes Relative: 4 %
Lymphs Abs: 0.3 10*3/uL — ABNORMAL LOW (ref 0.7–4.0)
MCH: 33.9 pg (ref 26.0–34.0)
MCHC: 33.1 g/dL (ref 30.0–36.0)
MCV: 102.4 fL — ABNORMAL HIGH (ref 80.0–100.0)
Monocytes Absolute: 0.2 10*3/uL (ref 0.1–1.0)
Monocytes Relative: 2 %
Neutro Abs: 6.6 10*3/uL (ref 1.7–7.7)
Neutrophils Relative %: 90 %
Platelets: 258 10*3/uL (ref 150–400)
RBC: 2.89 MIL/uL — ABNORMAL LOW (ref 3.87–5.11)
RDW: 14.9 % (ref 11.5–15.5)
WBC: 7.4 10*3/uL (ref 4.0–10.5)
nRBC: 0 % (ref 0.0–0.2)

## 2022-01-09 MED ORDER — LACTATED RINGERS IV SOLN: INTRAVENOUS | Status: DC

## 2022-01-09 MED ORDER — LACTATED RINGERS IV BOLUS
1000.0000 mL | Freq: Once | INTRAVENOUS | Status: AC
Start: 2022-01-09 — End: 2022-01-09
  Administered 2022-01-09: 1000 mL via INTRAVENOUS

## 2022-01-12 DIAGNOSIS — M3312 Other dermatopolymyositis with myopathy: Secondary | ICD-10-CM | POA: Diagnosis not present

## 2022-01-13 ENCOUNTER — Other Ambulatory Visit: Payer: Self-pay

## 2022-01-13 ENCOUNTER — Emergency Department (HOSPITAL_COMMUNITY): Payer: Medicare HMO

## 2022-01-13 ENCOUNTER — Inpatient Hospital Stay (HOSPITAL_COMMUNITY)
Admission: EM | Admit: 2022-01-13 | Discharge: 2022-01-20 | DRG: 178 | Disposition: A | Discharge status: Home Health | Payer: Medicare HMO | Attending: Internal Medicine | Admitting: Internal Medicine

## 2022-01-13 ENCOUNTER — Encounter (HOSPITAL_COMMUNITY): Payer: Self-pay

## 2022-01-13 DIAGNOSIS — R778 Other specified abnormalities of plasma proteins: Secondary | ICD-10-CM

## 2022-01-13 DIAGNOSIS — D509 Iron deficiency anemia, unspecified: Secondary | ICD-10-CM | POA: Diagnosis present

## 2022-01-13 DIAGNOSIS — E1165 Type 2 diabetes mellitus with hyperglycemia: Secondary | ICD-10-CM | POA: Diagnosis present

## 2022-01-13 DIAGNOSIS — R000 Tachycardia, unspecified: Secondary | ICD-10-CM | POA: Diagnosis not present

## 2022-01-13 DIAGNOSIS — Z8249 Family history of ischemic heart disease and other diseases of the circulatory system: Secondary | ICD-10-CM

## 2022-01-13 DIAGNOSIS — Z794 Long term (current) use of insulin: Secondary | ICD-10-CM

## 2022-01-13 DIAGNOSIS — B370 Candidal stomatitis: Secondary | ICD-10-CM | POA: Diagnosis present

## 2022-01-13 DIAGNOSIS — N3281 Overactive bladder: Secondary | ICD-10-CM | POA: Diagnosis present

## 2022-01-13 DIAGNOSIS — R29898 Other symptoms and signs involving the musculoskeletal system: Secondary | ICD-10-CM | POA: Diagnosis not present

## 2022-01-13 DIAGNOSIS — J690 Pneumonitis due to inhalation of food and vomit: Principal | ICD-10-CM | POA: Diagnosis present

## 2022-01-13 DIAGNOSIS — M170 Bilateral primary osteoarthritis of knee: Secondary | ICD-10-CM | POA: Diagnosis present

## 2022-01-13 DIAGNOSIS — Z79899 Other long term (current) drug therapy: Secondary | ICD-10-CM

## 2022-01-13 DIAGNOSIS — Z886 Allergy status to analgesic agent status: Secondary | ICD-10-CM

## 2022-01-13 DIAGNOSIS — I248 Other forms of acute ischemic heart disease: Secondary | ICD-10-CM | POA: Diagnosis present

## 2022-01-13 DIAGNOSIS — Z79631 Long term (current) use of antimetabolite agent: Secondary | ICD-10-CM

## 2022-01-13 DIAGNOSIS — A419 Sepsis, unspecified organism: Secondary | ICD-10-CM | POA: Diagnosis not present

## 2022-01-13 DIAGNOSIS — R6520 Severe sepsis without septic shock: Secondary | ICD-10-CM

## 2022-01-13 DIAGNOSIS — M3390 Dermatopolymyositis, unspecified, organ involvement unspecified: Secondary | ICD-10-CM | POA: Diagnosis present

## 2022-01-13 DIAGNOSIS — G9340 Encephalopathy, unspecified: Secondary | ICD-10-CM | POA: Diagnosis not present

## 2022-01-13 DIAGNOSIS — E1159 Type 2 diabetes mellitus with other circulatory complications: Secondary | ICD-10-CM | POA: Diagnosis present

## 2022-01-13 DIAGNOSIS — E785 Hyperlipidemia, unspecified: Secondary | ICD-10-CM | POA: Diagnosis present

## 2022-01-13 DIAGNOSIS — D849 Immunodeficiency, unspecified: Secondary | ICD-10-CM | POA: Diagnosis present

## 2022-01-13 DIAGNOSIS — R7989 Other specified abnormal findings of blood chemistry: Secondary | ICD-10-CM

## 2022-01-13 DIAGNOSIS — R55 Syncope and collapse: Secondary | ICD-10-CM | POA: Diagnosis present

## 2022-01-13 DIAGNOSIS — Z79890 Hormone replacement therapy (postmenopausal): Secondary | ICD-10-CM

## 2022-01-13 DIAGNOSIS — Z825 Family history of asthma and other chronic lower respiratory diseases: Secondary | ICD-10-CM

## 2022-01-13 DIAGNOSIS — Z800 Family history of malignant neoplasm of digestive organs: Secondary | ICD-10-CM

## 2022-01-13 DIAGNOSIS — M3313 Other dermatomyositis without myopathy: Secondary | ICD-10-CM | POA: Diagnosis present

## 2022-01-13 DIAGNOSIS — Z833 Family history of diabetes mellitus: Secondary | ICD-10-CM

## 2022-01-13 DIAGNOSIS — R001 Bradycardia, unspecified: Secondary | ICD-10-CM | POA: Diagnosis present

## 2022-01-13 DIAGNOSIS — F0390 Unspecified dementia without behavioral disturbance: Secondary | ICD-10-CM | POA: Diagnosis present

## 2022-01-13 DIAGNOSIS — D539 Nutritional anemia, unspecified: Secondary | ICD-10-CM | POA: Diagnosis present

## 2022-01-13 DIAGNOSIS — L89322 Pressure ulcer of left buttock, stage 2: Secondary | ICD-10-CM | POA: Diagnosis present

## 2022-01-13 DIAGNOSIS — Z743 Need for continuous supervision: Secondary | ICD-10-CM | POA: Diagnosis not present

## 2022-01-13 DIAGNOSIS — E119 Type 2 diabetes mellitus without complications: Secondary | ICD-10-CM

## 2022-01-13 DIAGNOSIS — J189 Pneumonia, unspecified organism: Secondary | ICD-10-CM

## 2022-01-13 DIAGNOSIS — E1151 Type 2 diabetes mellitus with diabetic peripheral angiopathy without gangrene: Secondary | ICD-10-CM | POA: Diagnosis present

## 2022-01-13 DIAGNOSIS — Z20822 Contact with and (suspected) exposure to covid-19: Secondary | ICD-10-CM | POA: Diagnosis present

## 2022-01-13 DIAGNOSIS — Z882 Allergy status to sulfonamides status: Secondary | ICD-10-CM

## 2022-01-13 DIAGNOSIS — R531 Weakness: Secondary | ICD-10-CM | POA: Diagnosis not present

## 2022-01-13 DIAGNOSIS — K219 Gastro-esophageal reflux disease without esophagitis: Secondary | ICD-10-CM | POA: Diagnosis present

## 2022-01-13 DIAGNOSIS — R509 Fever, unspecified: Secondary | ICD-10-CM | POA: Diagnosis present

## 2022-01-13 DIAGNOSIS — Z888 Allergy status to other drugs, medicaments and biological substances status: Secondary | ICD-10-CM

## 2022-01-13 DIAGNOSIS — I10 Essential (primary) hypertension: Secondary | ICD-10-CM | POA: Diagnosis not present

## 2022-01-13 DIAGNOSIS — E7800 Pure hypercholesterolemia, unspecified: Secondary | ICD-10-CM | POA: Diagnosis present

## 2022-01-13 DIAGNOSIS — Z720 Tobacco use: Secondary | ICD-10-CM

## 2022-01-13 DIAGNOSIS — M810 Age-related osteoporosis without current pathological fracture: Secondary | ICD-10-CM | POA: Diagnosis present

## 2022-01-13 DIAGNOSIS — Z7952 Long term (current) use of systemic steroids: Secondary | ICD-10-CM

## 2022-01-13 DIAGNOSIS — E039 Hypothyroidism, unspecified: Secondary | ICD-10-CM | POA: Diagnosis present

## 2022-01-13 DIAGNOSIS — Z66 Do not resuscitate: Secondary | ICD-10-CM | POA: Diagnosis present

## 2022-01-13 LAB — CBC WITH DIFFERENTIAL/PLATELET
Abs Immature Granulocytes: 0.47 10*3/uL — ABNORMAL HIGH (ref 0.00–0.07)
Basophils Absolute: 0.1 10*3/uL (ref 0.0–0.1)
Basophils Relative: 1 %
Eosinophils Absolute: 0.2 10*3/uL (ref 0.0–0.5)
Eosinophils Relative: 2 %
HCT: 32.2 % — ABNORMAL LOW (ref 36.0–46.0)
Hemoglobin: 10.6 g/dL — ABNORMAL LOW (ref 12.0–15.0)
Immature Granulocytes: 4 %
Lymphocytes Relative: 5 %
Lymphs Abs: 0.6 10*3/uL — ABNORMAL LOW (ref 0.7–4.0)
MCH: 33.8 pg (ref 26.0–34.0)
MCHC: 32.9 g/dL (ref 30.0–36.0)
MCV: 102.5 fL — ABNORMAL HIGH (ref 80.0–100.0)
Monocytes Absolute: 0.8 10*3/uL (ref 0.1–1.0)
Monocytes Relative: 7 %
Neutro Abs: 9.2 10*3/uL — ABNORMAL HIGH (ref 1.7–7.7)
Neutrophils Relative %: 81 %
Platelets: 209 10*3/uL (ref 150–400)
RBC: 3.14 MIL/uL — ABNORMAL LOW (ref 3.87–5.11)
RDW: 15.2 % (ref 11.5–15.5)
WBC: 11.4 10*3/uL — ABNORMAL HIGH (ref 4.0–10.5)
nRBC: 0 % (ref 0.0–0.2)

## 2022-01-13 LAB — URINALYSIS, ROUTINE W REFLEX MICROSCOPIC
Bilirubin Urine: NEGATIVE
Glucose, UA: >=500 mg/dL — AB
Hgb urine dipstick: NEGATIVE
Ketones, ur: 20 mg/dL — AB
Leukocytes,Ua: NEGATIVE
Nitrite: NEGATIVE
Protein, ur: 30 mg/dL — AB
Specific Gravity, Urine: 1.008 (ref 1.005–1.030)
pH: 6 (ref 5.0–8.0)

## 2022-01-13 LAB — COMPREHENSIVE METABOLIC PANEL
ALT: 27 U/L (ref 0–44)
AST: 23 U/L (ref 15–41)
Albumin: 3.2 g/dL — ABNORMAL LOW (ref 3.5–5.0)
Alkaline Phosphatase: 42 U/L (ref 38–126)
Anion gap: 13 (ref 5–15)
BUN: 26 mg/dL — ABNORMAL HIGH (ref 8–23)
CO2: 24 mmol/L (ref 22–32)
Calcium: 9.9 mg/dL (ref 8.9–10.3)
Chloride: 99 mmol/L (ref 98–111)
Creatinine, Ser: 0.86 mg/dL (ref 0.44–1.00)
GFR, Estimated: >60 mL/min (ref >60)
Glucose, Bld: 312 mg/dL — ABNORMAL HIGH (ref 70–99)
Potassium: 4.1 mmol/L (ref 3.5–5.1)
Sodium: 136 mmol/L (ref 135–145)
Total Bilirubin: 1 mg/dL (ref 0.3–1.2)
Total Protein: 6.1 g/dL — ABNORMAL LOW (ref 6.5–8.1)

## 2022-01-13 LAB — RESP PANEL BY RT-PCR (FLU A&B, COVID) ARPGX2
Influenza A by PCR: NEGATIVE
Influenza B by PCR: NEGATIVE
SARS Coronavirus 2 by RT PCR: NEGATIVE

## 2022-01-13 LAB — GLUCOSE, CAPILLARY
Glucose-Capillary: 299 mg/dL — ABNORMAL HIGH (ref 70–99)
Glucose-Capillary: 334 mg/dL — ABNORMAL HIGH (ref 70–99)

## 2022-01-13 LAB — TROPONIN I (HIGH SENSITIVITY)
Troponin I (High Sensitivity): 24 ng/L — ABNORMAL HIGH (ref <18)
Troponin I (High Sensitivity): 39 ng/L — ABNORMAL HIGH (ref <18)

## 2022-01-13 LAB — LACTIC ACID, PLASMA: Lactic Acid, Venous: 1.7 mmol/L (ref 0.5–1.9)

## 2022-01-13 LAB — CBG MONITORING, ED: Glucose-Capillary: 294 mg/dL — ABNORMAL HIGH (ref 70–99)

## 2022-01-13 MED ORDER — INSULIN ASPART 100 UNIT/ML FLEXPEN: 2.0000 [IU] | PEN_INJECTOR | SUBCUTANEOUS | Status: DC

## 2022-01-13 MED ORDER — PREDNISONE 5 MG PO TABS
15.0000 mg | ORAL_TABLET | Freq: Every day | ORAL | Status: DC
  Administered 2022-01-14 – 2022-01-16 (×3): 15 mg via ORAL
  Filled 2022-01-13 (×3): qty 3

## 2022-01-13 MED ORDER — METHOTREXATE 2.5 MG PO TABS
2.5000 mg | ORAL_TABLET | ORAL | Status: DC
Start: 2022-01-14 — End: 2022-01-13

## 2022-01-13 MED ORDER — VANCOMYCIN HCL 750 MG/150ML IV SOLN: 750.0000 mg | INTRAVENOUS | Status: DC

## 2022-01-13 MED ORDER — SODIUM CHLORIDE 0.9 % IV SOLN
2.0000 g | Freq: Once | INTRAVENOUS | Status: AC
  Administered 2022-01-13: 2 g via INTRAVENOUS
  Filled 2022-01-13: qty 12.5

## 2022-01-13 MED ORDER — DOCUSATE SODIUM 100 MG PO CAPS
100.0000 mg | ORAL_CAPSULE | Freq: Two times a day (BID) | ORAL | Status: DC
  Administered 2022-01-13 – 2022-01-20 (×10): 100 mg via ORAL
  Filled 2022-01-13 (×13): qty 1

## 2022-01-13 MED ORDER — AMLODIPINE BESYLATE 5 MG PO TABS
2.5000 mg | ORAL_TABLET | Freq: Every day | ORAL | Status: DC
Start: 2022-01-13 — End: 2022-01-17
  Administered 2022-01-13 – 2022-01-17 (×5): 2.5 mg via ORAL
  Filled 2022-01-13 (×5): qty 1

## 2022-01-13 MED ORDER — INSULIN DETEMIR 100 UNIT/ML ~~LOC~~ SOLN
3.0000 [IU] | Freq: Every day | SUBCUTANEOUS | Status: DC
  Administered 2022-01-13: 3 [IU] via SUBCUTANEOUS
  Filled 2022-01-13: qty 0.03

## 2022-01-13 MED ORDER — INSULIN ASPART 100 UNIT/ML IJ SOLN
2.0000 [IU] | Freq: Every day | INTRAMUSCULAR | Status: DC
  Administered 2022-01-13: 3 [IU] via SUBCUTANEOUS

## 2022-01-13 MED ORDER — INSULIN DETEMIR 100 UNIT/ML ~~LOC~~ SOLN
7.0000 [IU] | Freq: Every day | SUBCUTANEOUS | Status: DC
Start: 2022-01-14 — End: 2022-01-15
  Administered 2022-01-14: 7 [IU] via SUBCUTANEOUS
  Filled 2022-01-13 (×2): qty 0.07

## 2022-01-13 MED ORDER — INSULIN ASPART 100 UNIT/ML IJ SOLN: 5.0000 [IU] | Freq: Every day | INTRAMUSCULAR | Status: DC

## 2022-01-13 MED ORDER — LEVOTHYROXINE SODIUM 100 MCG PO TABS
200.0000 ug | ORAL_TABLET | Freq: Every day | ORAL | Status: DC
  Administered 2022-01-14 – 2022-01-20 (×6): 200 ug via ORAL
  Filled 2022-01-13 (×8): qty 2

## 2022-01-13 MED ORDER — LOSARTAN POTASSIUM 50 MG PO TABS
50.0000 mg | ORAL_TABLET | Freq: Every day | ORAL | Status: DC
  Administered 2022-01-14 – 2022-01-20 (×7): 50 mg via ORAL
  Filled 2022-01-13 (×7): qty 1

## 2022-01-13 MED ORDER — FOLIC ACID 1 MG PO TABS
1.0000 mg | ORAL_TABLET | Freq: Every evening | ORAL | Status: DC
  Administered 2022-01-13 – 2022-01-19 (×7): 1 mg via ORAL
  Filled 2022-01-13 (×7): qty 1

## 2022-01-13 MED ORDER — INSULIN DETEMIR 100 UNIT/ML FLEXPEN: 3.0000 [IU] | PEN_INJECTOR | SUBCUTANEOUS | Status: DC

## 2022-01-13 MED ORDER — INSULIN ASPART 100 UNIT/ML IJ SOLN: 8.0000 [IU] | Freq: Every day | INTRAMUSCULAR | Status: DC

## 2022-01-13 MED ORDER — CEFEPIME HCL 2 G IV SOLR
2.0000 g | Freq: Two times a day (BID) | INTRAVENOUS | Status: DC
  Administered 2022-01-14 – 2022-01-15 (×2): 2 g via INTRAVENOUS
  Filled 2022-01-13 (×3): qty 12.5

## 2022-01-13 MED ORDER — LEVETIRACETAM 500 MG PO TABS
500.0000 mg | ORAL_TABLET | Freq: Two times a day (BID) | ORAL | Status: DC
  Administered 2022-01-13 – 2022-01-20 (×14): 500 mg via ORAL
  Filled 2022-01-13 (×14): qty 1

## 2022-01-13 MED ORDER — SODIUM CHLORIDE 0.9 % IV SOLN: INTRAVENOUS | Status: DC

## 2022-01-13 MED ORDER — ACETAMINOPHEN 325 MG PO TABS
650.0000 mg | ORAL_TABLET | Freq: Four times a day (QID) | ORAL | Status: DC | PRN
  Administered 2022-01-14 – 2022-01-18 (×4): 650 mg via ORAL
  Filled 2022-01-13 (×5): qty 2

## 2022-01-13 MED ORDER — ACETAMINOPHEN 650 MG RE SUPP: 650.0000 mg | Freq: Four times a day (QID) | RECTAL | Status: DC | PRN

## 2022-01-13 MED ORDER — FAMOTIDINE 20 MG PO TABS
10.0000 mg | ORAL_TABLET | Freq: Every day | ORAL | Status: DC
  Administered 2022-01-13 – 2022-01-19 (×7): 10 mg via ORAL
  Filled 2022-01-13 (×7): qty 1

## 2022-01-13 MED ORDER — PROCHLORPERAZINE EDISYLATE 10 MG/2ML IJ SOLN: 10.0000 mg | Freq: Four times a day (QID) | INTRAMUSCULAR | Status: DC | PRN

## 2022-01-13 MED ORDER — INSULIN ASPART 100 UNIT/ML IJ SOLN
8.0000 [IU] | Freq: Every day | INTRAMUSCULAR | Status: DC
  Administered 2022-01-14: 9 [IU] via SUBCUTANEOUS

## 2022-01-13 MED ORDER — SODIUM CHLORIDE 0.9 % IV BOLUS
500.0000 mL | Freq: Once | INTRAVENOUS | Status: AC
  Administered 2022-01-13: 500 mL via INTRAVENOUS

## 2022-01-13 MED ORDER — SODIUM CHLORIDE 0.9 % IV BOLUS
500.0000 mL | Freq: Once | INTRAVENOUS | Status: AC
Start: 2022-01-13 — End: 2022-01-13
  Administered 2022-01-13: 500 mL via INTRAVENOUS

## 2022-01-13 MED ORDER — OMEGA-3-ACID ETHYL ESTERS 1 G PO CAPS
1.0000 g | ORAL_CAPSULE | Freq: Two times a day (BID) | ORAL | Status: DC
  Administered 2022-01-13 – 2022-01-20 (×13): 1 g via ORAL
  Filled 2022-01-13 (×14): qty 1

## 2022-01-13 MED ORDER — VANCOMYCIN HCL IN DEXTROSE 1-5 GM/200ML-% IV SOLN
1000.0000 mg | Freq: Once | INTRAVENOUS | Status: AC
  Administered 2022-01-13: 1000 mg via INTRAVENOUS
  Filled 2022-01-13: qty 200

## 2022-01-13 MED ORDER — ACETAMINOPHEN 325 MG PO TABS
650.0000 mg | ORAL_TABLET | Freq: Once | ORAL | Status: AC
  Administered 2022-01-13: 650 mg via ORAL
  Filled 2022-01-13: qty 2

## 2022-01-13 MED ORDER — SODIUM CHLORIDE 0.9% FLUSH
3.0000 mL | Freq: Two times a day (BID) | INTRAVENOUS | Status: DC
  Administered 2022-01-13 – 2022-01-20 (×8): 3 mL via INTRAVENOUS

## 2022-01-13 NOTE — ED Notes (Signed)
MD aware of rectal temp.

## 2022-01-13 NOTE — ED Provider Notes (Signed)
[facility]-EMERGENCY DEPT Provider Note   CSN: [id number] Arrival date & time: 01/13/22  1014     History  Chief Complaint  Patient presents with   Weakness    Renesmay JULIE-ANNE POPESCU is a 88 y.o. female.  88 year old female with prior medical history as detailed below presents for evaluation.  Patient was being assisted to bathroom by family at home earlier this morning.  Patient complained of generalized weakness and apparently had a near syncopal event.  Patient was transported to the ED for evaluation.  Patient with dementia.  She is nonambulatory at baseline.  She appears to be comfortable at time of initial exam.  Additional history obtained from family.  Patient with history of dermatomyositis.  She is currently being tapered down on her prednisone dose.  She is currently on 10 mg of prednisone daily.  She is still taking methotrexate weekly.  Patient is at baseline mental status.  She had a witnessed near syncopal event this morning while the family was helping her use the bathroom.  Prior to this event over the weekend the patient has had slightly decreased p.o. intake.  No reported fever at home.  The history is provided by the patient and medical records.  Weakness Severity:  Moderate Onset quality:  Unable to specify Timing:  Unable to specify Progression:  Unable to specify      Home Medications Prior to Admission medications   Medication Sig Start Date End Date Taking? Authorizing Provider  acetaminophen (TYLENOL) 650 MG CR tablet Take 1,300 mg by mouth at bedtime as needed for pain.    [provider]  amLODipine (NORVASC) 2.5 MG tablet Take 2.5 mg by mouth in the morning. 05/16/20   [provider]  atorvastatin (LIPITOR) 40 MG tablet Take 40 mg by mouth at bedtime. 02/16/19   [provider]  azelastine (ASTELIN) 0.1 % nasal spray Place 2 sprays into both nostrils 2 (two) times daily. Use in each nostril as  directed Patient not taking: Reported on 09/13/2021 03/15/20   Hetty Blend, FNP  B-D UF III MINI PEN NEEDLES 31G X 5 MM MISC USE AS DIRECTED TID 02/14/19   [provider]  BAYER LOW DOSE 81 MG EC tablet Take 81 mg by mouth in the morning. Swallow whole.    [provider]  cetirizine (ZYRTEC) 10 MG tablet Take 10 mg by mouth daily as needed for allergies.    [provider]  Cholecalciferol (VITAMIN D-3 PO) Take 10,000 Units by mouth in the morning.    [provider]  docusate sodium (COLACE) 100 MG capsule Take 1 capsule (100 mg total) by mouth 2 (two) times daily. 09/16/21   Rolly Salter, MD  Famotidine (PEPCID AC PO) Take 1 tablet by mouth at bedtime.    [provider]  feeding supplement, GLUCERNA SHAKE, (GLUCERNA SHAKE) LIQD Take 237 mLs by mouth 3 (three) times daily between meals. 09/16/21   Rolly Salter, MD  fluticasone Aleda Grana) 50 MCG/ACT nasal spray Place 1-2 sprays into both nostrils daily as needed for allergies or rhinitis.    [provider]  insulin aspart (NOVOLOG FLEXPEN) 100 UNIT/ML FlexPen 2-10 Units See admin instructions. Inject 2-10 units into the skin three times a day with meals, per sliding scale:  Breakfast: BGL 80-199 = 8 units; 200-299 = 9 units; 300 or greater = 10 units Lunch: BGL 80-199 = 5 units; 200-299 = 6 units; 300 or greater = 7 units  Supper/evening meal: BGL 80-199 = 2 units; 200-299 = 3 units; 300 or greater = 4 units    [provider]  insulin detemir (LEVEMIR FLEXTOUCH) 100 UNIT/ML FlexPen 3-7 Units See admin instructions. Inject 7 units into the skin in the morning and 3 units at bedtime if BGL is 200 or below and 4 units if BGL is greater than 200    [provider]  Iron-FA-B Cmp-C-Biot-Probiotic (FUSION PLUS) CAPS Take 1 capsule by mouth daily with breakfast. 03/02/19   [provider]  levETIRAcetam (KEPPRA) 500 MG tablet Take 1 tablet (500 mg total) by mouth 2 (two)  times daily. Patient taking differently: Take 500 mg by mouth in the morning and at bedtime. 10/11/19   Layne Benton, NP  liver oil-zinc oxide (DESITIN) 40 % ointment Apply topically 3 (three) times daily. 09/16/21   Rolly Salter, MD  mometasone (NASONEX) 50 MCG/ACT nasal spray Place 2 sprays into the nose daily as needed (for allergies or rhinitis- when not using Astelin). Patient not taking: Reported on 09/13/2021    [provider]  montelukast (SINGULAIR) 10 MG tablet Take 1 tablet (10 mg total) by mouth at bedtime. Patient not taking: Reported on 09/13/2021 10/17/20   Marcelyn Bruins, MD  Multiple Vitamins-Calcium (ONE-A-DAY WOMENS FORMULA) TABS Take 1 tablet by mouth daily after breakfast.    [provider]  NON FORMULARY Take 1 capsule by mouth See admin instructions. Fish oil 1,000 mg/Omega-3 300 mg capsules- Take 1 capsule by mouth every morning and at bedtime    [provider]  nystatin (MYCOSTATIN) 100000 UNIT/ML suspension Take 5 mLs (500,000 Units total) by mouth 4 (four) times daily. 11/21/21   Bethann Berkshire, MD  OneTouch Delica Lancets 33G MISC IC-10 CODE:  E11.65  Check blood sugar    [provider]  [facility] VERIO test strip 1 each 3 (three) times daily. 07/07/20   [provider]  polyethylene glycol (MIRALAX / GLYCOLAX) 17 g packet Take 17 g by mouth daily. 09/16/21   Rolly Salter, MD  predniSONE (DELTASONE) 20 MG tablet Take 10-20 mg by mouth See admin instructions. Take 20 mg by mouth in the morning and 10 mg at bedtime- through 09/16/2021. Decrease to 10 mg by mouth in the morning and at bedtime as of 09/17/2021. Further directions to be provided by MD [facility].    [provider]  SYNTHROID 200 MCG tablet Take 200 mcg by mouth daily before breakfast. 04/23/21   [provider]      Allergies    Nsaids, Biaxin [clarithromycin], Sulfa antibiotics, Bactrim [sulfamethoxazole-trimethoprim], and Lisinopril     Review of Systems   Review of Systems  Neurological:  Positive for weakness.  All other systems reviewed and are negative.   Physical Exam Updated Vital Signs BP (!) 165/62   Pulse (!) 106   Temp (!) 100.7 F (38.2 C) (Rectal)   Resp (!) 9   Ht 5\' 7"  (1.702 m)   Wt 51 kg   SpO2 96%   BMI 17.61 kg/m  Physical Exam Vitals and nursing note reviewed.  Constitutional:      General: She is not in acute distress.    Appearance: Normal appearance. She is well-developed.  HENT:     Head: Normocephalic and atraumatic.  Eyes:     Conjunctiva/sclera: Conjunctivae normal.     Pupils: Pupils are equal, round, and reactive to light.  Cardiovascular:     Rate and Rhythm: Normal rate  and regular rhythm.     Heart sounds: Normal heart sounds.  Pulmonary:     Effort: Pulmonary effort is normal. No respiratory distress.     Breath sounds: Normal breath sounds.  Abdominal:     General: There is no distension.     Palpations: Abdomen is soft.     Tenderness: There is no abdominal tenderness.  Musculoskeletal:        General: No deformity. Normal range of motion.     Cervical back: Normal range of motion and neck supple.  Skin:    General: Skin is warm and dry.  Neurological:     General: No focal deficit present.     Mental Status: She is alert and oriented to person, place, and time.     ED Results / Procedures / Treatments   Labs (all labs ordered are listed, but only abnormal results are displayed) Labs Reviewed  COMPREHENSIVE METABOLIC PANEL - Abnormal; Notable for the following components:      Result Value   Glucose, Bld 312 (*)    BUN 26 (*)    Total Protein 6.1 (*)    Albumin 3.2 (*)    All other components within normal limits  CBC WITH DIFFERENTIAL/PLATELET - Abnormal; Notable for the following components:   WBC 11.4 (*)    RBC 3.14 (*)    Hemoglobin 10.6 (*)    HCT 32.2 (*)    MCV 102.5 (*)    Neutro Abs 9.2 (*)    Lymphs Abs 0.6 (*)    Abs Immature  Granulocytes 0.47 (*)    All other components within normal limits  CBG MONITORING, ED - Abnormal; Notable for the following components:   Glucose-Capillary 294 (*)    All other components within normal limits  TROPONIN I (HIGH SENSITIVITY) - Abnormal; Notable for the following components:   Troponin I (High Sensitivity) 24 (*)    All other components within normal limits  CULTURE, BLOOD (ROUTINE X 2)  CULTURE, BLOOD (ROUTINE X 2)  RESP PANEL BY RT-PCR (FLU A&B, COVID) ARPGX2  LACTIC ACID, PLASMA  URINALYSIS, ROUTINE W REFLEX MICROSCOPIC  TROPONIN I (HIGH SENSITIVITY)    EKG EKG Interpretation  Date/Time:  Tuesday January 13 2022 11:35:58 EDT Ventricular Rate:  110 PR Interval:  177 QRS Duration: 95 QT Interval:  322 QTC Calculation: 436 R Axis:   -73 Text Interpretation: Sinus tachycardia Left axis deviation Confirmed by Kristine Royal [phone]) on 01/13/2022 11:58:59 AM  Radiology DG Chest Port 1 View  Result Date: 01/13/2022 CLINICAL DATA:  Weakness. EXAM: PORTABLE CHEST 1 VIEW COMPARISON:  Chest x-ray dated September 13, 2021. FINDINGS: Unchanged loop recorder. The heart size and mediastinal contours are within normal limits. Normal pulmonary vascularity. No focal consolidation, pleural effusion, or pneumothorax. No acute osseous abnormality. IMPRESSION: 1. No active disease. Electronically Signed   By: Obie Dredge M.D.   On: 01/13/2022 11:07    Procedures Procedures    Medications Ordered in ED Medications  sodium chloride 0.9 % bolus 500 mL (0 mLs Intravenous Stopped 01/13/22 1219)  sodium chloride 0.9 % bolus 500 mL (500 mLs Intravenous New Bag/Given 01/13/22 1306)  acetaminophen (TYLENOL) tablet 650 mg (650 mg Oral Given 01/13/22 1327)    ED Course/ Medical Decision Making/ A&P                           Medical Decision Making Amount and/or Complexity of Data Reviewed Labs: ordered.  Radiology: ordered.  Risk OTC drugs.    Medical Screen Complete  This  patient presented to the ED with complaint of near syncope, weakness.  This complaint involves an extensive number of treatment options. The initial differential diagnosis includes, but is not limited to, dehydration, AKI, viral versus bacterial infection, metabolic abnormality, etc.  This presentation is: Acute, Self-Limited, Previously Undiagnosed, Uncertain Prognosis, Complicated, Systemic Symptoms, and Threat to Life/Bodily Function  Presents after near syncopal event.  Patient with history of dementia, dermatomyositis, chronic immunosuppression with methotrexate and prednisone.  Patient appears to be at baseline mental status.  She is without specific acute complaint on arrival.  Patient noted to be mildly febrile with 100.7 rectal temp.  Chest x-ray is without clear indication of infection.  Urine sample is without suggestive of UTI.  Screening labs otherwise obtained actually improved versus recent visit on 6/23.  Co morbidities that complicated the patient's evaluation  Advanced age, dementia, immunosuppression   Additional history obtained:  Additional history obtained from [facility] External records from outside sources obtained and reviewed including prior ED visits and prior Inpatient records.    Lab Tests:  I ordered and personally interpreted labs.  The pertinent results include: CBC, CMP, troponin, UA, lactic acid   Imaging Studies ordered:  I ordered imaging studies including chest x-ray I independently visualized and interpreted obtained imaging which showed NAD I agree with the radiologist interpretation.   Cardiac Monitoring:  The patient was maintained on a cardiac monitor.  I personally viewed and interpreted the cardiac monitor which showed an underlying rhythm of: Sinus tach   Medicines ordered:  I ordered medication including IV fluids, Tylenol for suspected dehydration and fever Reevaluation of the patient after these medicines showed that the  patient: improved  Problem List / ED Course:  Near syncope, fever   Reevaluation:  After the interventions noted above, I reevaluated the patient and found that they have: improved   Disposition:  After consideration of the diagnostic results and the patients response to treatment, I feel that the patent would benefit from admission.          Final Clinical Impression(s) / ED Diagnoses Final diagnoses:  Near syncope  Fever, unspecified fever cause    Rx / DC Orders ED Discharge Orders     None         Wynetta Fines, MD 01/13/22 1438

## 2022-01-13 NOTE — ED Triage Notes (Signed)
BIBA from home. Assisted by family to floor in bathroom due to weakness. Denies pain.  Hx dementia. A&O to self and place.  Currently being tapered off prednisone per ems.  Non-ambulatory at baseline.

## 2022-01-14 ENCOUNTER — Observation Stay (HOSPITAL_BASED_OUTPATIENT_CLINIC_OR_DEPARTMENT_OTHER): Payer: Medicare HMO

## 2022-01-14 ENCOUNTER — Observation Stay (HOSPITAL_COMMUNITY)
Admit: 2022-01-14 | Discharge: 2022-01-14 | Disposition: A | Discharge status: Home / Self Care | Payer: Medicare HMO | Attending: Internal Medicine | Admitting: Internal Medicine

## 2022-01-14 DIAGNOSIS — I10 Essential (primary) hypertension: Secondary | ICD-10-CM | POA: Diagnosis not present

## 2022-01-14 DIAGNOSIS — R55 Syncope and collapse: Secondary | ICD-10-CM | POA: Diagnosis not present

## 2022-01-14 DIAGNOSIS — R509 Fever, unspecified: Secondary | ICD-10-CM

## 2022-01-14 DIAGNOSIS — R778 Other specified abnormalities of plasma proteins: Secondary | ICD-10-CM | POA: Diagnosis not present

## 2022-01-14 LAB — ECHOCARDIOGRAM COMPLETE
AR max vel: 2.5 cm2
AV Peak grad: 8 mmHg
Ao pk vel: 1.41 m/s
Area-P 1/2: 3.68 cm2
Calc EF: 62.8 %
Height: 67 in
MV M vel: 4.69 m/s
MV Peak grad: 87.8 mmHg
S' Lateral: 2.3 cm
Single Plane A2C EF: 64.8 %
Single Plane A4C EF: 61.9 %
Weight: 1883.61 oz

## 2022-01-14 LAB — HEMOGLOBIN A1C
Hgb A1c MFr Bld: 9 % — ABNORMAL HIGH (ref 4.8–5.6)
Mean Plasma Glucose: 212 mg/dL

## 2022-01-14 LAB — GLUCOSE, CAPILLARY
Glucose-Capillary: 245 mg/dL — ABNORMAL HIGH (ref 70–99)
Glucose-Capillary: 134 mg/dL — ABNORMAL HIGH (ref 70–99)
Glucose-Capillary: 106 mg/dL — ABNORMAL HIGH (ref 70–99)
Glucose-Capillary: 156 mg/dL — ABNORMAL HIGH (ref 70–99)

## 2022-01-14 MED ORDER — INSULIN ASPART 100 UNIT/ML IJ SOLN: 0.0000 [IU] | Freq: Every day | INTRAMUSCULAR | Status: DC

## 2022-01-14 MED ORDER — INSULIN DETEMIR 100 UNIT/ML ~~LOC~~ SOLN
5.0000 [IU] | Freq: Every day | SUBCUTANEOUS | Status: DC
  Administered 2022-01-14: 5 [IU] via SUBCUTANEOUS
  Filled 2022-01-14: qty 0.05

## 2022-01-14 MED ORDER — INSULIN ASPART 100 UNIT/ML IJ SOLN
0.0000 [IU] | Freq: Three times a day (TID) | INTRAMUSCULAR | Status: DC
  Administered 2022-01-14: 1 [IU] via SUBCUTANEOUS
  Administered 2022-01-15: 5 [IU] via SUBCUTANEOUS
  Administered 2022-01-15: 1 [IU] via SUBCUTANEOUS
  Administered 2022-01-15: 2 [IU] via SUBCUTANEOUS
  Administered 2022-01-16: 3 [IU] via SUBCUTANEOUS
  Administered 2022-01-16 – 2022-01-17 (×2): 1 [IU] via SUBCUTANEOUS
  Administered 2022-01-17: 2 [IU] via SUBCUTANEOUS
  Administered 2022-01-17 – 2022-01-18 (×2): 1 [IU] via SUBCUTANEOUS
  Administered 2022-01-18: 3 [IU] via SUBCUTANEOUS
  Administered 2022-01-18 – 2022-01-19 (×2): 1 [IU] via SUBCUTANEOUS
  Administered 2022-01-19: 2 [IU] via SUBCUTANEOUS
  Administered 2022-01-19 – 2022-01-20 (×2): 3 [IU] via SUBCUTANEOUS
  Administered 2022-01-20: 5 [IU] via SUBCUTANEOUS

## 2022-01-14 MED ORDER — INSULIN ASPART 100 UNIT/ML IJ SOLN
3.0000 [IU] | Freq: Three times a day (TID) | INTRAMUSCULAR | Status: DC
  Administered 2022-01-14 – 2022-01-16 (×3): 3 [IU] via SUBCUTANEOUS

## 2022-01-14 MED ORDER — VANCOMYCIN HCL 500 MG/100ML IV SOLN
500.0000 mg | INTRAVENOUS | Status: DC
  Administered 2022-01-14: 500 mg via INTRAVENOUS
  Filled 2022-01-14 (×2): qty 100

## 2022-01-14 MED ORDER — GLUCERNA SHAKE PO LIQD
237.0000 mL | Freq: Three times a day (TID) | ORAL | Status: DC
  Filled 2022-01-14: qty 237

## 2022-01-14 MED ORDER — GLUCERNA SHAKE PO LIQD
237.0000 mL | Freq: Three times a day (TID) | ORAL | Status: DC
  Administered 2022-01-14 – 2022-01-20 (×16): 237 mL via ORAL
  Filled 2022-01-14 (×20): qty 237

## 2022-01-14 NOTE — Care Management Obs Status (Signed)
[facility] NOTIFICATION   Patient Details  Name: [name] [name] MRN: [id number] Date of Birth: 1934/03/02   Medicare Observation Status Notification Given:  Yes    MahabirJuliann Pulse, RN 01/14/2022, 1:42 PM

## 2022-01-14 NOTE — Progress Notes (Signed)
  Transition of Care [facility]) Screening Note   Patient Details  Name: [name] [name] Date of Birth: 1934/07/11   Transition of Care [facility]) CM/SW Contact:    Dessa Phi, RN Phone Number: 01/14/2022, 1:42 PM    Transition of Care Department [facility]) has reviewed patient and no TOC needs have been identified at this time. We will continue to monitor patient advancement through interdisciplinary progression rounds. If new patient transition needs arise, please place a TOC consult.

## 2022-01-14 NOTE — Progress Notes (Signed)
EEG complete - results pending 

## 2022-01-14 NOTE — Progress Notes (Signed)
Pharmacy Antibiotic Note  [name] [name] is a 88 y.o. female admitted on 01/13/2022 with  syncope, fever of unknown origin . Pharmacy has been consulted for vancomycin and cefepime dosing.   Today, 01/14/22 -WBC slightly elevated -SCr WNL -Tmax 100.7 F - TBW < IBW  Plan: Continue cefepime 2 g IV q12h Reduce vancomycin dose to 500 mg IV q24h for estimated AUC of 400 Goal vancomycin AUC 400-550. Check levels at steady state as needed Monitor renal function and culture data  Height: '5\' 7"'$  (170.2 cm) Weight: 53.4 kg (117 lb 11.6 oz) IBW/kg (Calculated) : 61.6  Temp (24hrs), Avg:99.4 F (37.4 C), Min:97.9 F (36.6 C), Max:100.7 F (38.2 C)  Recent Labs  Lab 01/09/22 1508 01/13/22 1208  WBC 7.4 11.4*  CREATININE 1.04* 0.86  LATICACIDVEN  --  1.7    Estimated Creatinine Clearance: 40.3 mL/min (by C-G formula based on SCr of 0.86 mg/dL).    Allergies  Allergen Reactions   Nsaids Other (See Comments)    No nsaids due to kidney function!!!! Especially ibuprofen or Aleve.   Biaxin [Clarithromycin] Hives   Sulfa Antibiotics Hives and Other (See Comments)    NO sulfa-based meds!!   Bactrim [Sulfamethoxazole-Trimethoprim] Hives and Rash   Lisinopril Cough    Antimicrobials this admission: cefepime 6/27 >>  vancomycin 6/27>>   Dose adjustments this admission:  Microbiology results: 6/28 BCx: ngtd  Lenis Noon, PharmD 01/14/2022 8:24 AM

## 2022-01-14 NOTE — Procedures (Signed)
Patient Name: ALFHILD PARTCH  MRN: [id number]  Epilepsy Attending: Lora Havens  Referring Physician/Provider: Jonnie Finner, DO  Date: 01/14/2022 Duration: 22.36 mins  Patient history: 88yo F with syncope. EEG to evaluate for seizure.  Level of alertness: Awake  AEDs during EEG study:LEV  Technical aspects: This EEG study was done with scalp electrodes positioned according to the 10-20 International system of electrode placement. Electrical activity was acquired at a sampling rate of '500Hz'$  and reviewed with a high frequency filter of '70Hz'$  and a low frequency filter of '1Hz'$ . EEG data were recorded continuously and digitally stored.   Description: The posterior dominant rhythm consists of 8 Hz activity of moderate voltage (25-35 uV) seen predominantly in posterior head regions, symmetric and reactive to eye opening and eye closing. EEG showed intermittent generalized 3 to 6 Hz theta-delta slowing. Hyperventilation and photic stimulation were not performed.     ABNORMALITY - Intermittent slow, generalized  IMPRESSION: This study is suggestive of mild diffuse encephalopathy, nonspecific etiology. No seizures or epileptiform discharges were seen throughout the recording.  Brayant Dorr Barbra Sarks

## 2022-01-14 NOTE — Evaluation (Signed)
Physical Therapy Evaluation Patient Details Name: [name] [name] MRN: [id number] DOB: [birth year] Today's Date: 01/14/2022  History of Present Illness  Patient is 88 y.o. female who presented to ED from home on 6/27 from fainting while moving from bathroom to [facility] with daughter assisting. Daughter reports similar occurence 4 days prior. PMH significant for CVA, HTN, HLD, type II DM, PAD, hypothyroidism, recent diagnosis of dermatomyositis recently tappered off steroids.    Clinical Impression  [name] [name] is 88 y.o. female admitted with above HPI and diagnosis. Patient is currently limited by functional impairments below (see PT problem list). Patient lives with her daughters and has been declining over the last few months with daughter reporting much more recent decline when pt stopped steroids. PTA pt requires assist for stand pivot transfers and for all ADL's at baseline. Currently she requires Mod-Max +2 assist for bed mobility and sit<>stand. Patient will benefit from continued skilled PT interventions to address impairments and progress independence with mobility, recommending HHPT with 24/7 assist from family. Acute PT will follow and progress as able.        Recommendations for follow up therapy are one component of a multi-disciplinary discharge planning process, led by the attending physician.  Recommendations may be updated based on patient status, additional functional criteria and insurance authorization.  Follow Up Recommendations Home health PT      Assistance Recommended at Discharge Frequent or constant Supervision/Assistance  Patient can return home with the following  Two people to help with walking and/or transfers;A lot of help with bathing/dressing/bathroom;Assistance with cooking/housework;Assistance with feeding;Direct supervision/assist for medications management;Direct supervision/assist for financial management;Help with stairs or ramp for entrance;Assist for  transportation    Equipment Recommendations None recommended by PT  Recommendations for Other Services       Functional Status Assessment Patient has had a recent decline in their functional status and/or demonstrates limited ability to make significant improvements in function in a reasonable and predictable amount of time     Precautions / Restrictions Precautions Precautions: Fall Restrictions Weight Bearing Restrictions: No      Mobility  Bed Mobility Overal bed mobility: Needs Assistance Bed Mobility: Supine to Sit, Sit to Supine, Rolling Rolling: Max assist   Supine to sit: Max assist, HOB elevated, +2 for physical assistance, +2 for safety/equipment Sit to supine: Max assist, +2 for physical assistance, +2 for safety/equipment   General bed mobility comments: Max Assist to roll in bed Rt/Lt and Max+2 to bring LE's off EOB and raise trunk to sit up. Bed pad used to shift weight and scoot anteriorly to place feet on floor. Max/Total Assist to return to supine and control trunk.    Transfers Overall transfer level: Needs assistance Equipment used: 2 person hand held assist Transfers: Sit to/from Stand Sit to Stand: Mod assist, +2 physical assistance, +2 safety/equipment, From elevated surface           General transfer comment: Mod+2 Assist to rise from EOB, pt very fatigued and shaking. Max Assist needed to maintain balance in standing and pt unable to fully obtain upright posture. completed 3x sit<>stand from EOB.    Ambulation/Gait                  Stairs            Wheelchair Mobility    Modified Rankin (Stroke Patients Only)       Balance Overall balance assessment: Needs assistance Sitting-balance support: Feet supported, Bilateral upper extremity supported Sitting balance-Leahy  Scale: Poor Sitting balance - Comments: posterior lean and then flexed trunk, reliant on UE support and min assist to steady balance.   Standing balance  support: Bilateral upper extremity supported Standing balance-Leahy Scale: Zero Standing balance comment: Max assist to maintain balance in standing                             Pertinent Vitals/Pain Pain Assessment Pain Assessment: PAINAD Breathing: normal Negative Vocalization: none Facial Expression: smiling or inexpressive Body Language: relaxed Consolability: no need to console PAINAD Score: 0 Pain Intervention(s): Limited activity within patient's tolerance, Monitored during session, Repositioned    Home Living Family/patient expects to be discharged to:: Private residence Living Arrangements: Children Available Help at Discharge: Family Type of Home: House Home Access: Stairs to enter Entrance Stairs-Rails: None Entrance Stairs-Number of Steps: 3+1   Home Layout: One level Home Equipment: Conservation officer, nature (2 wheels);Rollator (4 wheels);Wheelchair - manual;Toilet riser      Prior Function Prior Level of Function : Needs assist       Physical Assist : Mobility (physical) Mobility (physical): Bed mobility;Transfers   Mobility Comments: pt using RW for transfers with assist from daughte to transfer to wc. no longer able to walk in home. Daughters assist with transfers into shower. ADLs Comments: pt requires assist for bathing/dressing from daughters.     Hand Dominance        Extremity/Trunk Assessment   Upper Extremity Assessment Upper Extremity Assessment: Generalized weakness    Lower Extremity Assessment Lower Extremity Assessment: Generalized weakness    Cervical / Trunk Assessment Cervical / Trunk Assessment: Kyphotic  Communication   Communication: Expressive difficulties  Cognition Arousal/Alertness: Awake/alert Behavior During Therapy: WFL for tasks assessed/performed Overall Cognitive Status: Within Functional Limits for tasks assessed                                          General Comments      Exercises      Assessment/Plan    PT Assessment Patient needs continued PT services  PT Problem List Decreased strength;Decreased activity tolerance;Decreased balance;Decreased mobility;Decreased cognition;Decreased knowledge of use of DME;Decreased safety awareness;Decreased knowledge of precautions       PT Treatment Interventions DME instruction;Gait training;Stair training;Therapeutic exercise;Balance training;Therapeutic activities;Functional mobility training;Neuromuscular re-education;Cognitive remediation;Patient/family education    PT Goals (Current goals can be found in the Care Plan section)  Acute Rehab PT Goals Patient Stated Goal: daughter wants to take pt home PT Goal Formulation: With family Time For Goal Achievement: 01/28/22 Potential to Achieve Goals: Fair    Frequency Min 3X/week     Co-evaluation               AM-PAC PT "6 Clicks" Mobility  Outcome Measure Help needed turning from your back to your side while in a flat bed without using bedrails?: Total Help needed moving from lying on your back to sitting on the side of a flat bed without using bedrails?: Total Help needed moving to and from a bed to a chair (including a wheelchair)?: Total Help needed standing up from a chair using your arms (e.g., wheelchair or bedside chair)?: Total Help needed to walk in hospital room?: Total Help needed climbing 3-5 steps with a railing? : Total 6 Click Score: 6    End of Session Equipment Utilized During Treatment: Gait belt Activity  Tolerance: Patient limited by fatigue Patient left: in bed;with call bell/phone within reach;with nursing/sitter in room;with family/visitor present;with bed alarm set Nurse Communication: Mobility status PT Visit Diagnosis: Muscle weakness (generalized) (M62.81);Other abnormalities of gait and mobility (R26.89);Difficulty in walking, not elsewhere classified (R26.2)    Time: 7116-5790 PT Time Calculation (min) (ACUTE ONLY): 31  min   Charges:   PT Evaluation $PT Eval Moderate Complexity: 1 Mod PT Treatments $Therapeutic Activity: 8-22 mins        Verner Mould, DPT Acute Rehabilitation Services Office [phone] Pager [phone]  01/14/22 3:31 PM

## 2022-01-14 NOTE — Progress Notes (Addendum)
   We have contacted med[employer] rep to interrogate loop recorder for 2 episodes of syncope and near syncope.  Results to follow.    Device interrogated but does not show bradycardia only atrial fib.  So no atrial fib and no way to detect brachycardia.  Will contact EP to see if brady detection can be turned on.      Cecilie Kicks, FNP-C [facility]  [phone] or after 5pm and on weekends call [phone] 01/14/2022.

## 2022-01-14 NOTE — Progress Notes (Signed)
[facility] PROGRESS NOTE    Progress Note  [name] [name]  UXN:[id number] DOB: 01-13-1934 DOA: 01/13/2022 PCP: [name] Manes, MD     Brief Narrative:   [name] [name] is an 88 y.o. female past medical history significant for essential hypertension, diabetes mellitus type 2 peripheral arterial disease, dermatomyositis comes in after a syncopal episode.  According to the daughter she was being assisted from the bathroom to the wheelchair, when she slumped over in the wheelchair and briefly lost consciousness there was no fall.  Here that she had a similar episode 4 days prior to admission.  Assessment/Plan:   Syncope: Twelve-lead EKG sinus tach, left axis deviation no T wave abnormalities UA showed no signs of infection. Chest x-ray no acute cardiopulmonary disease. Orthostatics have been ordered Echo pending EEG pending No events on telemetry. Consult PT OT, out of bed to chair.  Fever of unknown source: She is on chronic steroids which is being tapered as an outpatient. She had a Tmax of 100.7 yesterday, mild leukocytosis of 11.4. She was started empirically on Vanco and cefepime. Blood cultures have been ordered. I will ask the lab to see if they can send the urine sample that was sent out yesterday for urine culture.  Dermatomyositis: Methotrexate was held, steroids were continued as she has been on them chronically.  Diabetes mellitus type 2 uncontrolled with hyperglycemia: She is currently on steroids, A1c is 9.0 she was started on long-acting insulin plus sliding scale blood glucose remained high.  Macrocytic anemia: Hemoglobin has been remaining stable, currently 10.6.  Hypothyroidism, acquired Continue current home regimen.  Essential hypertension: Home regimen was continued, her blood pressure slightly elevated   GERD: Continue current home regimen.  Elevated troponins: Denies chest pain, twelve-lead EKG showed no signs of ischemia. Cardiac  biomarkers basically remained flat.  Left buttock ulcer present on admission RN Pressure Injury Documentation: Pressure Injury 12/08/16 Stage II -  Partial thickness loss of dermis presenting as a shallow open ulcer with a red, pink wound bed without slough. (Active)  12/08/16 0810  Location: Buttocks  Location Orientation: Left  Staging: Stage II -  Partial thickness loss of dermis presenting as a shallow open ulcer with a red, pink wound bed without slough.  Wound Description (Comments):   Present on Admission: No    Estimated body mass index is 18.44 kg/m as calculated from the following:   Height as of this encounter: '5\' 7"'$  (1.702 m).   Weight as of this encounter: 53.4 kg.   DVT prophylaxis: lovenox Family Communication: Daughters Status is: Observation The patient remains OBS appropriate and will d/c before 2 midnights.    Code Status:     Code Status Orders  (From admission, onward)           Start     Ordered   01/13/22 1557  Do not attempt resuscitation (DNR)  Continuous       Question Answer Comment  In the event of cardiac or respiratory ARREST Do not call a "code blue"   In the event of cardiac or respiratory ARREST Do not perform Intubation, CPR, defibrillation or ACLS   In the event of cardiac or respiratory ARREST Use medication by any route, position, wound care, and other measures to relive pain and suffering. May use oxygen, suction and manual treatment of airway obstruction as needed for comfort.      01/13/22 1556           Code Status History  Date Active Date Inactive Code Status Order ID Comments User Context   09/13/2021 1921 09/16/2021 1758 DNR [id number]  Lavina Hamman, MD ED   09/13/2021 1901 09/13/2021 1921 Full Code [id number]  Lavina Hamman, MD ED   10/08/2019 1040 10/12/2019 1639 Full Code [id number]  Vonzella Nipple, NP ED   08/21/2019 1118 08/23/2019 2332 Full Code [id number]  Norval Morton, MD ED   08/18/2019 0602 08/20/2019  1748 Full Code [id number]  Shela Leff, MD Inpatient   11/26/2016 0048 12/09/2016 1943 Full Code [id number]  Edwin Dada, MD Inpatient   06/10/2016 1355 06/11/2016 1537 Full Code [id number]  Rondel Jumbo, PA-C ED      Advance Directive Documentation    Flowsheet Row Most Recent Value  Type of Advance Directive Healthcare Power of Attorney  Pre-existing out of facility DNR order (yellow form or pink MOST form) --  "MOST" Form in Place? --         IV Access:   Peripheral IV   Procedures and diagnostic studies:   DG Chest Port 1 View  Result Date: 01/13/2022 CLINICAL DATA:  Weakness. EXAM: PORTABLE CHEST 1 VIEW COMPARISON:  Chest x-ray dated September 13, 2021. FINDINGS: Unchanged loop recorder. The heart size and mediastinal contours are within normal limits. Normal pulmonary vascularity. No focal consolidation, pleural effusion, or pneumothorax. No acute osseous abnormality. IMPRESSION: 1. No active disease. Electronically Signed   By: Titus Dubin M.D.   On: 01/13/2022 11:07     Medical Consultants:   None.   Subjective:    [name] [name] no complaints.  Objective:    Vitals:   01/14/22 0018 01/14/22 0437 01/14/22 0500 01/14/22 0504  BP: (!) 167/64 (!) 174/76  (!) 160/69  Pulse:  100    Resp:  18    Temp:  99.5 F (37.5 C)    TempSrc:      SpO2:  95%    Weight:   53.4 kg   Height:       SpO2: 95 %   Intake/Output Summary (Last 24 hours) at 01/14/2022 0758 Last data filed at 01/14/2022 0600 Gross per 24 hour  Intake 1953.38 ml  Output 1500 ml  Net 453.38 ml   Filed Weights   01/13/22 1040 01/14/22 0500  Weight: 51 kg 53.4 kg    Exam: General exam: In no acute distress. Respiratory system: Good air movement and clear to auscultation. Cardiovascular system: S1 & S2 heard, RRR. No JVD. Gastrointestinal system: Abdomen is nondistended, soft and nontender.  Extremities: No pedal edema. Skin: No rashes, lesions or  ulcers Psychiatry: Judgement and insight appear normal. Mood & affect appropriate.    Data Reviewed:    Labs: Basic Metabolic Panel: Recent Labs  Lab 01/09/22 1508 01/13/22 1208  NA 130* 136  K 4.6 4.1  CL 95* 99  CO2 24 24  GLUCOSE 152* 312*  BUN 36* 26*  CREATININE 1.04* 0.86  CALCIUM 10.0 9.9   GFR Estimated Creatinine Clearance: 40.3 mL/min (by C-G formula based on SCr of 0.86 mg/dL). Liver Function Tests: Recent Labs  Lab 01/13/22 1208  AST 23  ALT 27  ALKPHOS 42  BILITOT 1.0  PROT 6.1*  ALBUMIN 3.2*   No results for input(s): "LIPASE", "AMYLASE" in the last 168 hours. No results for input(s): "AMMONIA" in the last 168 hours. Coagulation profile No results for input(s): "INR", "PROTIME" in the last 168 hours. COVID-19 Labs  No results for input(s): "DDIMER", "FERRITIN", "LDH", "CRP"  in the last 72 hours.  Lab Results  Component Value Date   SARSCOV2NAA NEGATIVE 01/13/2022   SARSCOV2NAA NEGATIVE 11/21/2021   SARSCOV2NAA POSITIVE (A) 09/14/2021   SARSCOV2NAA POSITIVE (A) 09/13/2021    CBC: Recent Labs  Lab 01/09/22 1508 01/13/22 1208  WBC 7.4 11.4*  NEUTROABS 6.6 9.2*  HGB 9.8* 10.6*  HCT 29.6* 32.2*  MCV 102.4* 102.5*  PLT 258 209   Cardiac Enzymes: No results for input(s): "CKTOTAL", "CKMB", "CKMBINDEX", "TROPONINI" in the last 168 hours. BNP (last 3 results) No results for input(s): "PROBNP" in the last 8760 hours. CBG: Recent Labs  Lab 01/13/22 1027 01/13/22 1624 01/13/22 2111 01/14/22 0720  GLUCAP 294* 299* 334* 245*   D-Dimer: No results for input(s): "DDIMER" in the last 72 hours. Hgb A1c: Recent Labs    01/13/22 1603  HGBA1C 9.0*   Lipid Profile: No results for input(s): "CHOL", "HDL", "LDLCALC", "TRIG", "CHOLHDL", "LDLDIRECT" in the last 72 hours. Thyroid function studies: No results for input(s): "TSH", "T4TOTAL", "T3FREE", "THYROIDAB" in the last 72 hours.  Invalid input(s): "FREET3" Anemia work up: No results  for input(s): "VITAMINB12", "FOLATE", "FERRITIN", "TIBC", "IRON", "RETICCTPCT" in the last 72 hours. Sepsis Labs: Recent Labs  Lab 01/09/22 1508 01/13/22 1208  WBC 7.4 11.4*  LATICACIDVEN  --  1.7   Microbiology Recent Results (from the past 240 hour(s))  Resp Panel by RT-PCR (Flu A&B, Covid) Anterior Nasal Swab     Status: None   Collection Time: 01/13/22  1:28 PM   Specimen: Anterior Nasal Swab  Result Value Ref Range Status   SARS Coronavirus 2 by RT PCR NEGATIVE NEGATIVE Final    Comment: (NOTE) SARS-CoV-2 target nucleic acids are NOT DETECTED.  The SARS-CoV-2 RNA is generally detectable in upper respiratory specimens during the acute phase of infection. The lowest concentration of SARS-CoV-2 viral copies this assay can detect is 138 copies/mL. A negative result does not preclude SARS-Cov-2 infection and should not be used as the sole basis for treatment or other patient management decisions. A negative result may occur with  improper specimen collection/handling, submission of specimen other than nasopharyngeal swab, presence of viral mutation(s) within the areas targeted by this assay, and inadequate number of viral copies(<138 copies/mL). A negative result must be combined with clinical observations, patient history, and epidemiological information. The expected result is Negative.  Fact Sheet for Patients:  EntrepreneurPulse.com.au  Fact Sheet for Healthcare Providers:  IncredibleEmployment.be  This test is no t yet approved or cleared by the [place] FDA and  has been authorized for detection and/or diagnosis of SARS-CoV-2 by FDA under an Emergency Use Authorization (EUA). This EUA will remain  in effect (meaning this test can be used) for the duration of the COVID-19 declaration under Section 564(b)(1) of the Act, 21 U.S.C.section 360bbb-3(b)(1), unless the authorization is terminated  or revoked sooner.       Influenza  A by PCR NEGATIVE NEGATIVE Final   Influenza B by PCR NEGATIVE NEGATIVE Final    Comment: (NOTE) The Xpert Xpress SARS-CoV-2/FLU/RSV plus assay is intended as an aid in the diagnosis of influenza from Nasopharyngeal swab specimens and should not be used as a sole basis for treatment. Nasal washings and aspirates are unacceptable for Xpert Xpress SARS-CoV-2/FLU/RSV testing.  Fact Sheet for Patients: EntrepreneurPulse.com.au  Fact Sheet for Healthcare Providers: IncredibleEmployment.be  This test is not yet approved or cleared by the [place] FDA and has been authorized for detection and/or diagnosis of SARS-CoV-2 by FDA under an  Emergency Use Authorization (EUA). This EUA will remain in effect (meaning this test can be used) for the duration of the COVID-19 declaration under Section 564(b)(1) of the Act, 21 U.S.C. section 360bbb-3(b)(1), unless the authorization is terminated or revoked.  Performed at [facility], [place] [address] [place], [place] [address]   Culture, blood (routine x 2)     Status: None (Preliminary result)   Collection Time: 01/13/22  2:14 PM   Specimen: BLOOD  Result Value Ref Range Status   Specimen Description   Final    BLOOD BLOOD RIGHT ARM UPPER ARM Performed at [facility] [address], [place], [place] [address]    Special Requests   Final    BOTTLES DRAWN AEROBIC AND ANAEROBIC Blood Culture adequate volume Performed at [facility] [address] [place], [place] [address]    Culture   Final    NO GROWTH < 12 HOURS Performed at [facility] [address] [place], [place] [address]    Report Status PENDING  Incomplete  Culture, blood (routine x 2)     Status: None (Preliminary result)   Collection Time: 01/13/22  2:20 PM   Specimen: BLOOD LEFT WRIST  Result Value Ref Range Status   Specimen Description   Final    BLOOD LEFT  WRIST Performed at [facility] [address] [place], [place] [address]    Special Requests   Final    BOTTLES DRAWN AEROBIC AND ANAEROBIC Blood Culture adequate volume Performed at [facility] [address] [place], [place] [address]    Culture   Final    NO GROWTH < 12 HOURS Performed at [facility] [address] [place], [place] [address]    Report Status PENDING  Incomplete     Medications:    amLODipine  2.5 mg Oral Daily   docusate sodium  100 mg Oral BID   famotidine  10 mg Oral QHS   folic acid  1 mg Oral QPM   insulin aspart  2-4 Units Subcutaneous Q supper   insulin aspart  5-7 Units Subcutaneous Q lunch   insulin aspart  8-10 Units Subcutaneous Q breakfast   insulin detemir  3 Units Subcutaneous QHS   insulin detemir  7 Units Subcutaneous Daily   levETIRAcetam  500 mg Oral BID   levothyroxine  200 mcg Oral QAC breakfast   losartan  50 mg Oral Daily   omega-3 acid ethyl esters  1 g Oral BID   predniSONE  15 mg Oral Q breakfast   sodium chloride flush  3 mL Intravenous Q12H   Continuous Infusions:  sodium chloride 100 mL/hr at 01/14/22 0224   ceFEPime (MAXIPIME) IV     vancomycin        LOS: 0 days   Charlynne Cousins  [facility]  01/14/2022, 7:58 AM

## 2022-01-15 DIAGNOSIS — I10 Essential (primary) hypertension: Secondary | ICD-10-CM | POA: Diagnosis not present

## 2022-01-15 DIAGNOSIS — R509 Fever, unspecified: Secondary | ICD-10-CM | POA: Diagnosis not present

## 2022-01-15 DIAGNOSIS — R778 Other specified abnormalities of plasma proteins: Secondary | ICD-10-CM | POA: Diagnosis not present

## 2022-01-15 LAB — CREATININE, SERUM
Creatinine, Ser: 0.72 mg/dL (ref 0.44–1.00)
GFR, Estimated: >60 mL/min (ref >60)

## 2022-01-15 LAB — URINE CULTURE: Culture: <10000 — AB

## 2022-01-15 LAB — GLUCOSE, CAPILLARY
Glucose-Capillary: 65 mg/dL — ABNORMAL LOW (ref 70–99)
Glucose-Capillary: 95 mg/dL (ref 70–99)
Glucose-Capillary: 299 mg/dL — ABNORMAL HIGH (ref 70–99)
Glucose-Capillary: 176 mg/dL — ABNORMAL HIGH (ref 70–99)
Glucose-Capillary: 148 mg/dL — ABNORMAL HIGH (ref 70–99)
Glucose-Capillary: 98 mg/dL (ref 70–99)

## 2022-01-15 MED ORDER — FLUCONAZOLE 100MG IVPB: 100.0000 mg | INTRAVENOUS | Status: DC

## 2022-01-15 MED ORDER — METOPROLOL TARTRATE 25 MG PO TABS
12.5000 mg | ORAL_TABLET | Freq: Two times a day (BID) | ORAL | Status: DC
  Administered 2022-01-15: 12.5 mg via ORAL
  Filled 2022-01-15 (×2): qty 1

## 2022-01-15 MED ORDER — FLUCONAZOLE IN SODIUM CHLORIDE 200-0.9 MG/100ML-% IV SOLN
200.0000 mg | INTRAVENOUS | Status: DC
  Administered 2022-01-15: 200 mg via INTRAVENOUS
  Filled 2022-01-15: qty 100

## 2022-01-15 MED ORDER — INSULIN DETEMIR 100 UNIT/ML ~~LOC~~ SOLN
3.0000 [IU] | Freq: Every day | SUBCUTANEOUS | Status: DC
Start: 2022-01-15 — End: 2022-01-16
  Filled 2022-01-15: qty 0.03

## 2022-01-15 MED ORDER — INSULIN DETEMIR 100 UNIT/ML ~~LOC~~ SOLN
5.0000 [IU] | Freq: Every day | SUBCUTANEOUS | Status: DC
Start: 2022-01-15 — End: 2022-01-16
  Administered 2022-01-15: 5 [IU] via SUBCUTANEOUS
  Filled 2022-01-15 (×2): qty 0.05

## 2022-01-15 MED ORDER — LIDOCAINE 5 % EX PTCH
1.0000 | MEDICATED_PATCH | CUTANEOUS | Status: DC
  Administered 2022-01-15 – 2022-01-20 (×6): 1 via TRANSDERMAL
  Filled 2022-01-15 (×6): qty 1

## 2022-01-15 MED ORDER — SODIUM CHLORIDE 0.9 % IV SOLN
1.5000 g | Freq: Four times a day (QID) | INTRAVENOUS | Status: DC
  Administered 2022-01-15 – 2022-01-16 (×3): 1.5 g via INTRAVENOUS
  Filled 2022-01-15 (×4): qty 4

## 2022-01-15 NOTE — Evaluation (Signed)
Clinical/Bedside Swallow Evaluation Patient Details  Name: [name] [name] MRN: [id number] Date of Birth: 1933-09-29  Today's Date: 01/15/2022 Time: SLP Start Time (ACUTE ONLY): 63 SLP Stop Time (ACUTE ONLY): 1640 SLP Time Calculation (min) (ACUTE ONLY): 25 min  Past Medical History:  Past Medical History:  Diagnosis Date   Arthritis    "knees, legs" (06/10/2016)   Ascites    Chronic kidney disease    "related to my diabetes"   Chronic lower back pain    Diabetes mellitus without complication (HCC)    Eczema    GERD (gastroesophageal reflux disease)    High cholesterol    Hyperlipidemia    Hypertension    Hypothyroidism    OAB (overactive bladder)    Osteoporosis    Thyroid disease    Type II diabetes mellitus ([place])    Urticaria    Past Surgical History:  Past Surgical History:  Procedure Laterality Date   ABDOMINAL HYSTERECTOMY     KNEE ARTHROSCOPY     LAPAROTOMY N/A 12/03/2016   Procedure: EXPLORATORY LAPAROTOMY, LYSIS OF ADHESIONS;  Surgeon: Armandina Gemma, MD;  Location: [facility];  Service: General;  Laterality: N/A;   LOOP RECORDER INSERTION N/A 08/23/2019   Procedure: LOOP RECORDER INSERTION;  Surgeon: Evans Lance, MD;  Location: [facility];  Service: Cardiovascular;  Laterality: N/A;   vocal cord polyps     HPI:  Patient is 88 y.o. female who presented to ED from home on 6/27 from fainting while moving from bathroom to [facility] with daughter assisting. Daughter reports similar occurence 4 days prior. PMH significant for CVA, HTN, HLD, type II DM, PAD, hypothyroidism, recent diagnosis of dermatomyositis recently tappered off steroids.  OT saw pt and reported she was pocketing her food, Swallow eval ordered.    Assessment / Plan / Recommendation  Clinical Impression  Pt with prior MBS test conducted 08/2021 showing mild oropharyngeal dysphagia.  Today suspect exacerbatino of baseline dysphagia due to her mentation and potential oral candidiasis.  Pt able to follow  some directions with delay - no family present. SLP provided po trials including Glucerna, Sandwich bite, [employer], and [employer].  Large portion of small bite of soft sandwich was not orally transited into pharynx.  She required more than 4 minutes to finally clear small bite - prolonged mastication observed with significant oral retention on left without pt awareness;  Even use of icee nor consuming thin water or Glucerna fully cleared retained soilds.  Pt finally had to expectorate boluses x2 during session with max cues and severe delay.  At this time, do not recommend solids requiring mastication - will follow up for dietary advancement readiness.  Liquid swallow was more efficient for pt than solids - though mild delay continued.  No family present at this time but yellow coating noted on tongue that did not clear - ? if could be consistent with oral candidiasis.  If present and treated, pt's oral deficits may improve. SLP Visit Diagnosis: Dysphagia, oropharyngeal phase (R13.12)    Aspiration Risk  Mild aspiration risk    Diet Recommendation Other (Comment) (full liquids)   Liquid Administration via: Cup;Straw Medication Administration: Whole meds with puree Supervision: Staff to assist with self feeding Compensations: Slow rate;Small sips/bites;Monitor for anterior loss Postural Changes: Seated upright at 90 degrees;Remain upright for at least 30 minutes after po intake    Other  Recommendations Oral Care Recommendations: Oral care BID Other Recommendations: Have oral suction available    Recommendations for  follow up therapy are one component of a multi-disciplinary discharge planning process, led by the attending physician.  Recommendations may be updated based on patient status, additional functional criteria and insurance authorization.  Follow up Recommendations Follow physician's recommendations for discharge plan and follow up therapies      Assistance Recommended at Discharge  Frequent or constant Supervision/Assistance  Functional Status Assessment Patient has had a recent decline in their functional status and demonstrates the ability to make significant improvements in function in a reasonable and predictable amount of time.  Frequency and Duration min 1 x/week  2 weeks       Prognosis Prognosis for Safe Diet Advancement: Good      Swallow Study   General HPI: Patient is 88 y.o. female who presented to ED from home on 6/27 from fainting while moving from bathroom to [facility] with daughter assisting. Daughter reports similar occurence 4 days prior. PMH significant for CVA, HTN, HLD, type II DM, PAD, hypothyroidism, recent diagnosis of dermatomyositis recently tappered off steroids.  OT saw pt and reported she was pocketing her food, Swallow eval ordered. Previous Swallow Assessment: prior MBS 08/2021 mild oropharyngeal dysphagia, trace penetration, no aspiration, trace to mild retention, retrograde movement of puree solid at cervical esophagus Diet Prior to this Study: NPO Temperature Spikes Noted: No Respiratory Status: Room air History of Recent Intubation: No Behavior/Cognition: Alert;Requires cueing;Other (Comment) (delayed responses) Oral Cavity Assessment: Other (comment) (coating on tongue, see picture in media) Oral Care Completed by SLP: Yes Oral Cavity - Dentition: Dentures, top;Dentures, bottom Vision: Impaired for self-feeding Self-Feeding Abilities: Needs assist Patient Positioning: Upright in bed Baseline Vocal Quality: Normal Volitional Cough: Cognitively unable to elicit Volitional Swallow: Unable to elicit    Oral/Motor/Sensory Function Overall Oral Motor/Sensory Function: Generalized oral weakness   Ice Chips Ice chips: Within functional limits Presentation: Spoon   Thin Liquid Thin Liquid: Within functional limits Presentation: Straw    Nectar Thick Nectar Thick Liquid: Within functional limits Presentation: Straw Pharyngeal Phase  Impairments: Suspected delayed Swallow   Honey Thick Honey Thick Liquid: Not tested   Puree Puree: Impaired Presentation: Spoon Oral Phase Impairments: Reduced lingual movement/coordination Oral Phase Functional Implications: Prolonged oral transit Pharyngeal Phase Impairments: Suspected delayed Swallow   Solid     Solid: Impaired Oral Phase Impairments: Impaired mastication Oral Phase Functional Implications: Left lateral sulci pocketing;Right lateral sulci pocketing Other Comments: Large portion of small bite of soft sandwich was not orally transited into pharynx - prolonged mastication observed with significant oral retention on left without pt awareness;  Even use of icee nor consuming thin water or Glucerna fully cleared retained soilds.  Pt finally had to expectorate boluses x2 during session with max cues and severe delay.  At this time, do not recommend solids requiring mastication - will follow up for dietary advancement readiness.  No family present at this time but yellow coating noted on tongue that did not clear - ? if could be consistent with oral candidiasis.  If present and treated, pt's oral deficits may improve.      Macario Golds 01/15/2022,4:59 PM  Kathleen Lime, MS [facility] SLP Acute Rehab Services Office [phone] Pager [phone]

## 2022-01-15 NOTE — TOC Initial Note (Addendum)
Transition of Care [facility]) - Initial/Assessment Note    Patient Details  Name: [name] [name] MRN: [id number] Date of Birth: March 15, 1934  [facility]) CM/SW Contact:    Dessa Phi, RN Phone Number: 01/15/2022, 10:22 AM  Clinical Narrative:  Active w/[facility] agency-rep Angela aware-HHPT/OT.                 Expected Discharge Plan: [place] Barriers to Discharge: Continued Medical Work up   Patient Goals and CMS Choice Patient states their goals for this hospitalization and ongoing recovery are:: Home CMS Medicare.gov Compare Post Acute Care list provided to:: Patient Represenative (must comment) Edwin Cap dtr [phone]) Choice offered to / list presented to : Adult Children  Expected Discharge Plan and Services Expected Discharge Plan: [place]   Discharge Planning Services: CM Consult Post Acute Care Choice: [place] Agency: [place]        Prior Living Arrangements/Services     Patient language and need for interpreter reviewed:: Yes Do you feel safe going back to the place where you live?: Yes      Need for Family Participation in Patient Care: Yes (Comment) Care giver support system in place?: Yes (comment) Current home services: Home PT, Home OT (Active [facility]-HHPT/OT)    Activities of Daily Living Home Assistive Devices/Equipment: Environmental consultant (specify type), Wheelchair, Shower chair with back, Bedside commode/3-in-1, Eyeglasses, Dentures (specify type) (uses gait belt, walk in shower with bench at her house, toilet bars at her house, full set dentures) ADL Screening (condition at time of admission) Patient's cognitive ability adequate to safely complete daily activities?: Yes Is the patient deaf or have difficulty hearing?: Yes Does the patient have difficulty seeing, even when wearing glasses/contacts?: No Does the patient have difficulty concentrating,  remembering, or making decisions?: Yes (hoh and hard to understand) Patient able to express need for assistance with ADLs?: Yes Does the patient have difficulty dressing or bathing?: Yes Independently performs ADLs?: No Communication: Independent Dressing (OT): Needs assistance Is this a change from baseline?: Pre-admission baseline Grooming: Needs assistance Is this a change from baseline?: Pre-admission baseline Feeding: Needs assistance Is this a change from baseline?: Pre-admission baseline Bathing: Needs assistance Is this a change from baseline?: Pre-admission baseline Toileting: Needs assistance Is this a change from baseline?: Pre-admission baseline In/Out Bed: Needs assistance Is this a change from baseline?: Pre-admission baseline Walks in Home: Needs assistance Is this a change from baseline?: Pre-admission baseline (uses wheelchair or gaitbelt with walker depending on how she feels.) Does the patient have difficulty walking or climbing stairs?: Yes Weakness of Legs: Both Weakness of Arms/Hands: Both  Permission Sought/Granted Permission sought to share information with : Case Manager Permission granted to share information with : Yes, Verbal Permission Granted  Share Information with NAME: Case Manager           Emotional Assessment Appearance:: Appears stated age Attitude/Demeanor/Rapport: Gracious Affect (typically observed): Accepting Orientation: : Oriented to Self, Oriented to Place, Oriented to  Time, Oriented to Situation Alcohol / Substance Use: Not Applicable Psych Involvement: No (comment)  Admission diagnosis:  Syncope [R55] Near syncope [R55] Fever, unspecified fever cause [R50.9] Patient Active Problem List   Diagnosis Date Noted   Syncope 01/13/2022   Fever, unknown origin 01/13/2022  Elevated troponin 01/13/2022   COVID-19 virus infection 09/14/2021   Syncope and collapse 09/13/2021   Dermatopolymyositis, unspecified, organ involvement  unspecified ([place]) 09/13/2021   Poor appetite 09/13/2021   Hyponatremia 09/13/2021   Uncontrolled diabetes mellitus with hypoglycemia without coma, with long-term current use of insulin ([place]) 09/13/2021   Sacral ulcer ([place]) 09/13/2021   Allergic conjunctivitis of both eyes 03/15/2020   PAD (peripheral artery disease) ([place]) 10/24/2019   Malnutrition ([place]) 10/10/2019   Seizures ([place]) with prolonged postictal period 51/70/0174   Acute metabolic encephalopathy 94/49/6759   Right shoulder pain 08/21/2019   Leukocytosis 08/21/2019   Acute encephalopathy 08/18/2019   Enterocolitis 08/18/2019   Hyperglycemia 08/18/2019   Chronic kidney disease    Pressure injury of skin 12/09/2016   SBO (small bowel obstruction) s/p lysis of adhesions 12/03/2016 12/04/2016   Acute urinary retention 11/27/2016   Ascites 11/27/2016   Abdominal distension    Facial droop    Hyperkalemia 11/25/2016   Sepsis ([place]) 11/25/2016   Acute renal failure superimposed on stage 2 chronic kidney disease ([place]) 11/25/2016   Delirium 11/25/2016   Age-related osteoporosis without current pathological fracture 10/01/2016   Hyperlipidemia 06/10/2016   GERD (gastroesophageal reflux disease) 06/10/2016   Insomnia 06/10/2016   Viral gastroenteritis    Right hip pain 05/06/2015   Vaginal atrophy 11/01/2013   ECZEMA 07/24/2008   INTERMITTENT VERTIGO 07/24/2008   Constipation 11/16/2007   INSOMNIA 08/12/2007   THRUSH 08/03/2007   Essential hypertension 02/14/2007   Allergic rhinitis due to allergen 02/14/2007   Hypothyroidism, acquired 01/26/2007   Type 2 diabetes mellitus without complication, with long-term current use of insulin ([place]) 01/26/2007   GERD 01/26/2007   DEGENERATIVE JOINT DISEASE 01/26/2007   PCP:  Lajean Manes, MD Pharmacy:   [facility] [address] [place] [address] Phone: [phone] Fax:  [phone]     Social Determinants of Health (SDOH) Interventions    Readmission Risk Interventions     No data to display

## 2022-01-15 NOTE — Progress Notes (Signed)
[facility] PROGRESS NOTE    Progress Note  [name] [name]  CHY:[id number] DOB: 15-Nov-1933 DOA: 01/13/2022 PCP: Lajean Manes, MD     Brief Narrative:   [name] [name] is an 88 y.o. female past medical history significant for essential hypertension, diabetes mellitus type 2 peripheral arterial disease, dermatomyositis comes in after a syncopal episode.  According to the daughter she was being assisted from the bathroom to the wheelchair, when she slumped over in the wheelchair and briefly lost consciousness there was no fall.  Here that she had a similar episode 4 days prior to admission.  Assessment/Plan:   Syncope: Cardiology interrogated the pacer showed bradycardia no atrial fibrillation UA showed no signs of infection. Chest x-ray no acute cardiopulmonary disease. Orthostatics have been ordered Echo no aortic stenosis EEG no evidence of seizures diffuse encephalopathy nonspecific. No events on telemetry. Physical therapy evaluated her, home health PT  Fever of unknown source: She is on chronic steroids which is being tapered as an outpatient. Has remained afebrile, continue IV vancomycin and cefepime. Blood cultures and urine cultures are pending.  Sinus tach: Start low-dose metoprolol.  Dermatomyositis: Methotrexate was held, steroids were continued as she has been on them chronically. She is currently on prednisone 15.  Diabetes mellitus type 2 uncontrolled with hyperglycemia: A1c of 8.9, decrease long-acting insulin and meal coverage insulin.  Macrocytic anemia: Hemoglobin has been remaining stable, currently 10.6.  Hypothyroidism, acquired Continue current home regimen.  Essential hypertension: Home regimen was continued, her blood pressure slightly elevated   GERD: Continue current home regimen.  Elevated troponins: Denies chest pain, twelve-lead EKG showed no signs of ischemia. Cardiac biomarkers basically remained flat.  Left buttock ulcer  present on admission RN Pressure Injury Documentation: Pressure Injury 12/08/16 Stage II -  Partial thickness loss of dermis presenting as a shallow open ulcer with a red, pink wound bed without slough. (Active)  12/08/16 0810  Location: Buttocks  Location Orientation: Left  Staging: Stage II -  Partial thickness loss of dermis presenting as a shallow open ulcer with a red, pink wound bed without slough.  Wound Description (Comments):   Present on Admission: No    Estimated body mass index is 18.65 kg/m as calculated from the following:   Height as of this encounter: '5\' 7"'$  (1.702 m).   Weight as of this encounter: 54 kg.   DVT prophylaxis: lovenox Family Communication: Daughters Status is: Observation The patient remains OBS appropriate and will d/c before 2 midnights.    Code Status:     Code Status Orders  (From admission, onward)           Start     Ordered   01/13/22 1557  Do not attempt resuscitation (DNR)  Continuous       Question Answer Comment  In the event of cardiac or respiratory ARREST Do not call a "code blue"   In the event of cardiac or respiratory ARREST Do not perform Intubation, CPR, defibrillation or ACLS   In the event of cardiac or respiratory ARREST Use medication by any route, position, wound care, and other measures to relive pain and suffering. May use oxygen, suction and manual treatment of airway obstruction as needed for comfort.      01/13/22 1556           Code Status History     Date Active Date Inactive Code Status Order ID Comments User Context   09/13/2021 1921 09/16/2021 1758 DNR [id number]  Berle Mull  M, MD ED   09/13/2021 1901 09/13/2021 1921 Full Code [id number]  Lavina Hamman, MD ED   10/08/2019 1040 10/12/2019 1639 Full Code [id number]  Vonzella Nipple, NP ED   08/21/2019 1118 08/23/2019 2332 Full Code [id number]  Norval Morton, MD ED   08/18/2019 0602 08/20/2019 1748 Full Code [id number]  Shela Leff, MD Inpatient    11/26/2016 0048 12/09/2016 1943 Full Code [id number]  Edwin Dada, MD Inpatient   06/10/2016 1355 06/11/2016 1537 Full Code [id number]  Elease Hashimoto ED      Advance Directive Documentation    Flowsheet Row Most Recent Value  Type of Advance Directive Healthcare Power of Attorney  Pre-existing out of facility DNR order (yellow form or pink MOST form) --  "MOST" Form in Place? --         IV Access:   Peripheral IV   Procedures and diagnostic studies:   ECHOCARDIOGRAM COMPLETE  Result Date: 01/14/2022    ECHOCARDIOGRAM REPORT   Patient Name:   [name] [name] Date of Exam: 01/14/2022 Medical Rec #:  [id number]       Height:       67.0 in Accession #:    [id number]      Weight:       117.7 lb Date of Birth:  1934-06-13       BSA:          1.614 m Patient Age:    21 years        BP:           160/69 mmHg Patient Gender: F               HR:           77 bpm. Exam Location:  Inpatient Procedure: 2D Echo, Cardiac Doppler and Color Doppler Indications:    Syncope  History:        Patient has prior history of Echocardiogram examinations. Risk                 Factors:Hypertension and Diabetes.  Sonographer:    Jyl Heinz Referring Phys: [id number] Jonnie Finner  Sonographer Comments: Pt supine IMPRESSIONS  1. Left ventricular ejection fraction, by estimation, is 60 to 65%. The left ventricle has normal function. The left ventricle has no regional wall motion abnormalities. Left ventricular diastolic parameters are consistent with Grade I diastolic dysfunction (impaired relaxation).  2. Right ventricular systolic function is normal. The right ventricular size is normal.  3. The mitral valve is degenerative. No evidence of mitral valve regurgitation. No evidence of mitral stenosis.  4. The aortic valve is normal in structure. Aortic valve regurgitation is not visualized. No aortic stenosis is present.  5. The inferior vena cava is normal in size with greater than 50% respiratory  variability, suggesting right atrial pressure of 3 mmHg. FINDINGS  Left Ventricle: Left ventricular ejection fraction, by estimation, is 60 to 65%. The left ventricle has normal function. The left ventricle has no regional wall motion abnormalities. The left ventricular internal cavity size was normal in size. There is  no left ventricular hypertrophy. Left ventricular diastolic parameters are consistent with Grade I diastolic dysfunction (impaired relaxation). Right Ventricle: The right ventricular size is normal. No increase in right ventricular wall thickness. Right ventricular systolic function is normal. Left Atrium: Left atrial size was normal in size. Right Atrium: Right atrial size was normal in size. Pericardium: There is no evidence of pericardial effusion. Mitral Valve:  The mitral valve is degenerative in appearance. There is mild thickening of the mitral valve leaflet(s). There is mild calcification of the mitral valve leaflet(s). Mild mitral annular calcification. No evidence of mitral valve regurgitation. No evidence of mitral valve stenosis. Tricuspid Valve: The tricuspid valve is normal in structure. Tricuspid valve regurgitation is not demonstrated. No evidence of tricuspid stenosis. Aortic Valve: The aortic valve is normal in structure. Aortic valve regurgitation is not visualized. No aortic stenosis is present. Aortic valve peak gradient measures 8.0 mmHg. Pulmonic Valve: The pulmonic valve was normal in structure. Pulmonic valve regurgitation is not visualized. No evidence of pulmonic stenosis. Aorta: The aortic root is normal in size and structure. Venous: The inferior vena cava is normal in size with greater than 50% respiratory variability, suggesting right atrial pressure of 3 mmHg. IAS/Shunts: No atrial level shunt detected by color flow Doppler.  LEFT VENTRICLE PLAX 2D LVIDd:         3.50 cm     Diastology LVIDs:         2.30 cm     LV e' medial:    6.53 cm/s LV PW:         1.00 cm     LV  E/e' medial:  10.1 LV IVS:        1.30 cm     LV e' lateral:   5.55 cm/s LVOT diam:     2.00 cm     LV E/e' lateral: 11.9 LV SV:         67 LV SV Index:   41 LVOT Area:     3.14 cm  LV Volumes (MOD) LV vol d, MOD A2C: 56.3 ml LV vol d, MOD A4C: 54.6 ml LV vol s, MOD A2C: 19.8 ml LV vol s, MOD A4C: 20.8 ml LV SV MOD A2C:     36.5 ml LV SV MOD A4C:     54.6 ml LV SV MOD BP:      34.9 ml RIGHT VENTRICLE             IVC RV Basal diam:  3.00 cm     IVC diam: 1.20 cm RV Mid diam:    2.60 cm RV S prime:     11.70 cm/s TAPSE (M-mode): 1.9 cm LEFT ATRIUM             Index        RIGHT ATRIUM           Index LA diam:        2.60 cm 1.61 cm/m   RA Area:     10.80 cm LA Vol (A2C):   30.6 ml 18.95 ml/m  RA Volume:   21.70 ml  13.44 ml/m LA Vol (A4C):   27.9 ml 17.28 ml/m LA Biplane Vol: 31.0 ml 19.20 ml/m  AORTIC VALVE AV Area (Vmax): 2.50 cm AV Vmax:        141.00 cm/s AV Peak Grad:   8.0 mmHg LVOT Vmax:      112.00 cm/s LVOT Vmean:     84.700 cm/s LVOT VTI:       0.212 m  AORTA Ao Root diam: 2.90 cm Ao Asc diam:  2.30 cm MITRAL VALVE               TRICUSPID VALVE MV Area (PHT): 3.68 cm    TR Peak grad:   35.8 mmHg MV Decel Time: 206 msec    TR Vmax:  299.00 cm/s MR Peak grad: 87.8 mmHg MR Vmax:      468.50 cm/s  SHUNTS MV E velocity: 66.00 cm/s  Systemic VTI:  0.21 m MV A velocity: 80.10 cm/s  Systemic Diam: 2.00 cm MV E/A ratio:  0.82 Candee Furbish MD Electronically signed by Candee Furbish MD Signature Date/Time: 01/14/2022/4:58:44 PM    Final    EEG adult  Result Date: 01/14/2022 Lora Havens, MD     01/14/2022  7:03 PM Patient Name: [name] [name] MRN: [id number] Epilepsy Attending: Lora Havens Referring Physician/Provider: Jonnie Finner, DO Date: 01/14/2022 Duration: 22.36 mins Patient history: 88yo F with syncope. EEG to evaluate for seizure. Level of alertness: Awake AEDs during EEG study:LEV Technical aspects: This EEG study was done with scalp electrodes positioned according to the 10-20  International system of electrode placement. Electrical activity was acquired at a sampling rate of '500Hz'$  and reviewed with a high frequency filter of '70Hz'$  and a low frequency filter of '1Hz'$ . EEG data were recorded continuously and digitally stored. Description: The posterior dominant rhythm consists of 8 Hz activity of moderate voltage (25-35 uV) seen predominantly in posterior head regions, symmetric and reactive to eye opening and eye closing. EEG showed intermittent generalized 3 to 6 Hz theta-delta slowing. Hyperventilation and photic stimulation were not performed.   ABNORMALITY - Intermittent slow, generalized IMPRESSION: This study is suggestive of mild diffuse encephalopathy, nonspecific etiology. No seizures or epileptiform discharges were seen throughout the recording. Lora Havens   DG Chest Port 1 View  Result Date: 01/13/2022 CLINICAL DATA:  Weakness. EXAM: PORTABLE CHEST 1 VIEW COMPARISON:  Chest x-ray dated September 13, 2021. FINDINGS: Unchanged loop recorder. The heart size and mediastinal contours are within normal limits. Normal pulmonary vascularity. No focal consolidation, pleural effusion, or pneumothorax. No acute osseous abnormality. IMPRESSION: 1. No active disease. Electronically Signed   By: Titus Dubin M.D.   On: 01/13/2022 11:07     Medical Consultants:   None.   Subjective:    [name] [name] she has no new complaints today.   Objective:    Vitals:   01/14/22 1143 01/14/22 2106 01/15/22 0500 01/15/22 0634  BP: (!) 137/57 (!) 164/80  126/80  Pulse: (!) 109 96  (!) 108  Resp:  20  20  Temp: 99.5 F (37.5 C) 98.8 F (37.1 C)    TempSrc: Oral   Oral  SpO2: 98% 100%  100%  Weight:   54 kg   Height:       SpO2: 100 %   Intake/Output Summary (Last 24 hours) at 01/15/2022 0722 Last data filed at 01/15/2022 0656 Gross per 24 hour  Intake 2045.24 ml  Output 1600 ml  Net 445.24 ml   Filed Weights   01/13/22 1040 01/14/22 0500 01/15/22 0500   Weight: 51 kg 53.4 kg 54 kg    Exam: General exam: In no acute distress. Respiratory system: Good air movement and clear to auscultation. Cardiovascular system: S1 & S2 heard, RRR. No JVD. Gastrointestinal system: Abdomen is nondistended, soft and nontender.  Extremities: No pedal edema. Skin: No rashes, lesions or ulcers Psychiatry: Judgement and insight appear normal. Mood & affect appropriate.   Data Reviewed:    Labs: Basic Metabolic Panel: Recent Labs  Lab 01/09/22 1508 01/13/22 1208 01/15/22 0440  NA 130* 136  --   K 4.6 4.1  --   CL 95* 99  --   CO2 24 24  --   GLUCOSE 152* 312*  --  BUN 36* 26*  --   CREATININE 1.04* 0.86 0.72  CALCIUM 10.0 9.9  --    GFR Estimated Creatinine Clearance: 43.8 mL/min (by C-G formula based on SCr of 0.72 mg/dL). Liver Function Tests: Recent Labs  Lab 01/13/22 1208  AST 23  ALT 27  ALKPHOS 42  BILITOT 1.0  PROT 6.1*  ALBUMIN 3.2*   No results for input(s): "LIPASE", "AMYLASE" in the last 168 hours. No results for input(s): "AMMONIA" in the last 168 hours. Coagulation profile No results for input(s): "INR", "PROTIME" in the last 168 hours. COVID-19 Labs  No results for input(s): "DDIMER", "FERRITIN", "LDH", "CRP" in the last 72 hours.  Lab Results  Component Value Date   SARSCOV2NAA NEGATIVE 01/13/2022   SARSCOV2NAA NEGATIVE 11/21/2021   SARSCOV2NAA POSITIVE (A) 09/14/2021   SARSCOV2NAA POSITIVE (A) 09/13/2021    CBC: Recent Labs  Lab 01/09/22 1508 01/13/22 1208  WBC 7.4 11.4*  NEUTROABS 6.6 9.2*  HGB 9.8* 10.6*  HCT 29.6* 32.2*  MCV 102.4* 102.5*  PLT 258 209   Cardiac Enzymes: No results for input(s): "CKTOTAL", "CKMB", "CKMBINDEX", "TROPONINI" in the last 168 hours. BNP (last 3 results) No results for input(s): "PROBNP" in the last 8760 hours. CBG: Recent Labs  Lab 01/14/22 1144 01/14/22 1713 01/14/22 2058 01/15/22 0444 01/15/22 0524  GLUCAP 134* 106* 156* 65* 95   D-Dimer: No results  for input(s): "DDIMER" in the last 72 hours. Hgb A1c: Recent Labs    01/13/22 1603  HGBA1C 9.0*   Lipid Profile: No results for input(s): "CHOL", "HDL", "LDLCALC", "TRIG", "CHOLHDL", "LDLDIRECT" in the last 72 hours. Thyroid function studies: No results for input(s): "TSH", "T4TOTAL", "T3FREE", "THYROIDAB" in the last 72 hours.  Invalid input(s): "FREET3" Anemia work up: No results for input(s): "VITAMINB12", "FOLATE", "FERRITIN", "TIBC", "IRON", "RETICCTPCT" in the last 72 hours. Sepsis Labs: Recent Labs  Lab 01/09/22 1508 01/13/22 1208  WBC 7.4 11.4*  LATICACIDVEN  --  1.7   Microbiology Recent Results (from the past 240 hour(s))  Resp Panel by RT-PCR (Flu A&B, Covid) Anterior Nasal Swab     Status: None   Collection Time: 01/13/22  1:28 PM   Specimen: Anterior Nasal Swab  Result Value Ref Range Status   SARS Coronavirus 2 by RT PCR NEGATIVE NEGATIVE Final    Comment: (NOTE) SARS-CoV-2 target nucleic acids are NOT DETECTED.  The SARS-CoV-2 RNA is generally detectable in upper respiratory specimens during the acute phase of infection. The lowest concentration of SARS-CoV-2 viral copies this assay can detect is 138 copies/mL. A negative result does not preclude SARS-Cov-2 infection and should not be used as the sole basis for treatment or other patient management decisions. A negative result may occur with  improper specimen collection/handling, submission of specimen other than nasopharyngeal swab, presence of viral mutation(s) within the areas targeted by this assay, and inadequate number of viral copies(<138 copies/mL). A negative result must be combined with clinical observations, patient history, and epidemiological information. The expected result is Negative.  Fact Sheet for Patients:  EntrepreneurPulse.com.au  Fact Sheet for Healthcare Providers:  IncredibleEmployment.be  This test is no t yet approved or cleared by the  [place] FDA and  has been authorized for detection and/or diagnosis of SARS-CoV-2 by FDA under an Emergency Use Authorization (EUA). This EUA will remain  in effect (meaning this test can be used) for the duration of the COVID-19 declaration under Section 564(b)(1) of the Act, 21 U.S.C.section 360bbb-3(b)(1), unless the authorization is terminated  or  revoked sooner.       Influenza A by PCR NEGATIVE NEGATIVE Final   Influenza B by PCR NEGATIVE NEGATIVE Final    Comment: (NOTE) The Xpert Xpress SARS-CoV-2/FLU/RSV plus assay is intended as an aid in the diagnosis of influenza from Nasopharyngeal swab specimens and should not be used as a sole basis for treatment. Nasal washings and aspirates are unacceptable for Xpert Xpress SARS-CoV-2/FLU/RSV testing.  Fact Sheet for Patients: EntrepreneurPulse.com.au  Fact Sheet for Healthcare Providers: IncredibleEmployment.be  This test is not yet approved or cleared by the [place] FDA and has been authorized for detection and/or diagnosis of SARS-CoV-2 by FDA under an Emergency Use Authorization (EUA). This EUA will remain in effect (meaning this test can be used) for the duration of the COVID-19 declaration under Section 564(b)(1) of the Act, 21 U.S.C. section 360bbb-3(b)(1), unless the authorization is terminated or revoked.  Performed at [facility], [place] [address] [place], [place] [address]   Culture, blood (routine x 2)     Status: None (Preliminary result)   Collection Time: 01/13/22  2:14 PM   Specimen: BLOOD  Result Value Ref Range Status   Specimen Description   Final    BLOOD BLOOD RIGHT ARM UPPER ARM Performed at [facility] [address] [place], [place] [address]    Special Requests   Final    BOTTLES DRAWN AEROBIC AND ANAEROBIC Blood Culture adequate volume Performed at [facility] [address]  [place], [place] [address]    Culture   Final    NO GROWTH < 12 HOURS Performed at [facility] [address], [place], [place] [address]    Report Status PENDING  Incomplete  Culture, blood (routine x 2)     Status: None (Preliminary result)   Collection Time: 01/13/22  2:20 PM   Specimen: BLOOD LEFT WRIST  Result Value Ref Range Status   Specimen Description   Final    BLOOD LEFT WRIST Performed at [facility] [address], [place], [place] [address]    Special Requests   Final    BOTTLES DRAWN AEROBIC AND ANAEROBIC Blood Culture adequate volume Performed at [facility] [address] [place], [place] [address]    Culture   Final    NO GROWTH < 12 HOURS Performed at [facility] [address] [place], [place] [address]    Report Status PENDING  Incomplete     Medications:    amLODipine  2.5 mg Oral Daily   docusate sodium  100 mg Oral BID   famotidine  10 mg Oral QHS   feeding supplement (GLUCERNA SHAKE)  237 mL Oral TID BM   folic acid  1 mg Oral QPM   insulin aspart  0-5 Units Subcutaneous QHS   insulin aspart  0-9 Units Subcutaneous TID WC   insulin aspart  3 Units Subcutaneous TID WC   insulin detemir  5 Units Subcutaneous QHS   insulin detemir  7 Units Subcutaneous Daily   levETIRAcetam  500 mg Oral BID   levothyroxine  200 mcg Oral QAC breakfast   losartan  50 mg Oral Daily   omega-3 acid ethyl esters  1 g Oral BID   predniSONE  15 mg Oral Q breakfast   sodium chloride flush  3 mL Intravenous Q12H   Continuous Infusions:  sodium chloride 100 mL/hr at 01/15/22 0029   ceFEPime (MAXIPIME) IV 2 g (  01/15/22 0532)   vancomycin 500 mg (01/14/22 1751)      LOS: 0 days   Charlynne Cousins  [facility]  01/15/2022, 7:22 AM

## 2022-01-15 NOTE — Evaluation (Signed)
Occupational Therapy Evaluation Patient Details Name: [name] [name] MRN: [id number] DOB: [birth year] Today's Date: 01/15/2022   History of Present Illness Patient is 88 y.o. female who presented to ED from home on 6/27 from fainting while moving from bathroom to [facility] with daughter assisting. Daughter reports similar occurence 4 days prior. PMH significant for CVA, HTN, HLD, type II DM, PAD, hypothyroidism, recent diagnosis of dermatomyositis recently tappered off steroids.   Clinical Impression   Patient is currently requiring assistance with ADLs including Total assist with Lower body ADLs, Max-Total assist with Upper body ADLs, all at bed level, as well as  Max Assist +2 assist with bed mobility and inability to safety transfer today. Current level of function is below patient's typical baseline.    During this evaluation, patient was limited by generalized weakness, impaired activity tolerance, severe cognitive deficits which pt's daughter reports are not baseline, and clonic-like jerking movements while EOB which made sitting very unsafe and pt was returned to supine.  Pt following few 1-step commands with eye open. Pt also pocketing her food, all of which has the potential to impact patient's and caregivers' safety and independence during functional mobility, as well as performance for ADLs.    Patient lives at home, with 2 daughters who rotate to provide 24/7 supervision and assistance.  Pt does receive assistance at baseline with almost all ADLs but can usual sit and feed self and perform simple grooming.  Currently pt requiring assistance of at least 2 people for bed mobility and sitting balance and is not safe to go home with assist of 1 person as of this evaluation.  Pt seemed to do better on 01/14/22 with physical therapy and unsure if this is a sudden change in status. RN reported plan to page MD. Patient demonstrates fair rehab potential, and should benefit from continued skilled occupational  therapy services while in acute care to maximize safety, independence and quality of life at home.  Continued occupational therapy services in a SNF setting prior to return home is recommended.  ?    Recommendations for follow up therapy are one component of a multi-disciplinary discharge planning process, led by the attending physician.  Recommendations may be updated based on patient status, additional functional criteria and insurance authorization.   Follow Up Recommendations  Skilled nursing-short term rehab (<3 hours/day)    Assistance Recommended at Discharge Frequent or constant Supervision/Assistance  Patient can return home with the following A lot of help with walking and/or transfers;Two people to help with walking and/or transfers;Two people to help with bathing/dressing/bathroom;A lot of help with bathing/dressing/bathroom;Assistance with feeding;Assistance with cooking/housework;Assist for transportation;Direct supervision/assist for financial management;Direct supervision/assist for medications management    Functional Status Assessment  Patient has had a recent decline in their functional status and demonstrates the ability to make significant improvements in function in a reasonable and predictable amount of time.  Equipment Recommendations   (TBD)    Recommendations for Other Services       Precautions / Restrictions Precautions Precautions: Fall Restrictions Weight Bearing Restrictions: No      Mobility Bed Mobility Overal bed mobility: Needs Assistance Bed Mobility: Supine to Sit, Sit to Supine     Supine to sit: Max assist, HOB elevated, +2 for physical assistance, +2 for safety/equipment Sit to supine: Max assist, +2 for physical assistance, +2 for safety/equipment        Transfers  Balance Overall balance assessment: Needs assistance   Sitting balance-Leahy Scale: Zero Sitting balance - Comments: Lage jerking  movements with posterior/anterior/latearl losses of balance. Required Max Assist of 2 to sit for safety.                                   ADL either performed or assessed with clinical judgement   ADL Overall ADL's : Needs assistance/impaired Eating/Feeding: Maximal assistance;Bed level Eating/Feeding Details (indicate cue type and reason): Pt pocketing foot. Daughter reports pt will do this at times, but this is worse. Not spitting or swallowing to command. Grooming: Bed level;Maximal assistance;Total assistance   Upper Body Bathing: Maximal assistance;Bed level;Total assistance   Lower Body Bathing: Total assistance;Bed level;+2 for physical assistance   Upper Body Dressing : Maximal assistance;Bed level   Lower Body Dressing: +2 for physical assistance;Total assistance;Bed level     Toilet Transfer Details (indicate cue type and reason): Unable to safely attempt. Pt not following commands sufficiently to be safe and with involuntary clonic jerking motions once EOB with very poor sitting balance. Toileting- Clothing Manipulation and Hygiene: Bed level;+2 for physical assistance;Total assistance       Functional mobility during ADLs: Maximal assistance;+2 for physical assistance       Vision   Additional Comments: Unable to assess due to cognitive deficits.     Perception     Praxis      Pertinent Vitals/Pain Pain Assessment Pain Assessment: PAINAD Breathing: normal Negative Vocalization: none Facial Expression: smiling or inexpressive Body Language: relaxed Consolability: no need to console PAINAD Score: 0 Pain Intervention(s): Monitored during session     Hand Dominance Right   Extremity/Trunk Assessment Upper Extremity Assessment Upper Extremity Assessment: LUE deficits/detail;RUE deficits/detail;Generalized weakness RUE Deficits / Details: Very weak ~3-/5 with grip ~3+/5 RUE Coordination: decreased fine motor LUE Deficits / Details: Very  weak with ~2+/5 throughout. LUE Coordination: decreased fine motor   Lower Extremity Assessment Lower Extremity Assessment: Generalized weakness   Cervical / Trunk Assessment Cervical / Trunk Assessment: Kyphotic;Other exceptions Cervical / Trunk Exceptions: Pt with clonic-like jerking through entire body x2 once with initiallyEOB and again when returning to supine.   Communication Communication Communication: Expressive difficulties   Cognition Arousal/Alertness: Awake/alert Behavior During Therapy:  (Confused.) Overall Cognitive Status: Impaired/Different from baseline Area of Impairment: Orientation, Attention, Following commands, Awareness, Problem solving, Memory                 Orientation Level: Disoriented to, Place, Time, Situation Current Attention Level: Focused Memory: Decreased short-term memory, Decreased recall of precautions Following Commands: Follows one step commands inconsistently   Awareness:  (None) Problem Solving: Slow processing, Difficulty sequencing, Requires verbal cues, Decreased initiation, Requires tactile cues General Comments: Daughter reports that pt is usually A&Ox4 at baseline.  Pt oriented to last name and month of birth. Poor ability to follow most commands.     General Comments       Exercises     Shoulder Instructions      Home Living Family/patient expects to be discharged to:: Private residence Living Arrangements: Children Available Help at Discharge: Family;Available 24 hours/day Type of Home: House Home Access: Stairs to enter [employer] of Steps: 3+1 Entrance Stairs-Rails: None Home Layout: One level     Bathroom Shower/Tub: Occupational psychologist: Standard Bathroom Accessibility: Yes   Home Equipment: Rollator (4 wheels);Wheelchair - IT sales professional (2 wheels)  Additional Comments: pt stays with one daughter Mon-Thurs and then goes back to her own house where another  daugther helps her.      Prior Functioning/Environment Prior Level of Function : Needs assist       Physical Assist : Mobility (physical) Mobility (physical): Bed mobility;Transfers   Mobility Comments: pt using RW for transfers with assist from daughter to transfer to wc. no longer able to walk in home. Daughters assist with transfers into shower. ADLs Comments: pt requires assist for bathing/dressing from daughters.        OT Problem List: Decreased strength;Decreased coordination;Decreased cognition;Decreased activity tolerance;Decreased safety awareness;Impaired balance (sitting and/or standing);Decreased knowledge of use of DME or AE;Impaired tone;Impaired UE functional use;Decreased knowledge of precautions      OT Treatment/Interventions: Self-care/ADL training;Therapeutic exercise;Therapeutic activities;Cognitive remediation/compensation;Patient/family education;DME and/or AE instruction;Balance training    OT Goals(Current goals can be found in the care plan section) Acute Rehab OT Goals Patient Stated Goal: Per daughter, For pt to get strong enough to get out of bed to chair OT Goal Formulation: With family Time For Goal Achievement: 01/29/22 Potential to Achieve Goals: Fair ADL Goals Pt Will Perform Eating: with set-up;with supervision (In least restrictive, safe position) Pt Will Perform Grooming: with set-up;with min guard assist (In least restrictive, safe position) Pt Will Perform Upper Body Bathing: with set-up;with min guard assist (In least restrictive, safe position) Pt Will Transfer to Toilet: with min assist;stand pivot transfer;squat pivot transfer (With CG- (daughter) demonstrating correct method to maximize safety) Additional ADL Goal #1: Pt will improve sitting balance to fair+ static with no more than unilateral UE support, tolerating 8 min EOB,  in order to increase safety and participation during seated ADLs.  OT Frequency: Min 2X/week    Co-evaluation               AM-PAC OT "6 Clicks" Daily Activity     Outcome Measure Help from another person eating meals?: A Lot Help from another person taking care of personal grooming?: A Lot Help from another person toileting, which includes using toliet, bedpan, or urinal?: Total Help from another person bathing (including washing, rinsing, drying)?: Total Help from another person to put on and taking off regular upper body clothing?: A Lot Help from another person to put on and taking off regular lower body clothing?: Total 6 Click Score: 9   End of Session Nurse Communication: Mobility status;Other (comment) (Concern re: change in mental status since last night and jerking movements)  Activity Tolerance: Patient limited by lethargy;Treatment limited secondary to medical complications (Comment) (RN notified) Patient left: in bed;with call bell/phone within reach;with bed alarm set;with family/visitor present;with nursing/sitter in room  OT Visit Diagnosis: Muscle weakness (generalized) (M62.81);History of falling (Z91.81);Other symptoms and signs involving cognitive function;Other symptoms and signs involving the nervous system (R29.898);Feeding difficulties (R63.3);Apraxia (R48.2);Cognitive communication deficit (R41.841);Other abnormalities of gait and mobility (R26.89)                Time: 0272-5366 OT Time Calculation (min): 30 min Charges:  OT General Charges $OT Visit: 1 Visit OT Evaluation $OT Eval Low Complexity: 1 Low OT Treatments $Therapeutic Activity: 8-22 mins  Anderson [place], [place] Office: [phone] 01/15/2022  Julien Girt 01/15/2022, 12:26 PM

## 2022-01-16 ENCOUNTER — Observation Stay (HOSPITAL_COMMUNITY): Payer: Medicare HMO

## 2022-01-16 DIAGNOSIS — K219 Gastro-esophageal reflux disease without esophagitis: Secondary | ICD-10-CM | POA: Diagnosis not present

## 2022-01-16 DIAGNOSIS — I10 Essential (primary) hypertension: Secondary | ICD-10-CM | POA: Diagnosis not present

## 2022-01-16 DIAGNOSIS — R54 Age-related physical debility: Secondary | ICD-10-CM | POA: Diagnosis not present

## 2022-01-16 DIAGNOSIS — E119 Type 2 diabetes mellitus without complications: Secondary | ICD-10-CM | POA: Diagnosis not present

## 2022-01-16 DIAGNOSIS — Z79631 Long term (current) use of antimetabolite agent: Secondary | ICD-10-CM | POA: Diagnosis not present

## 2022-01-16 DIAGNOSIS — J690 Pneumonitis due to inhalation of food and vomit: Secondary | ICD-10-CM | POA: Diagnosis not present

## 2022-01-16 DIAGNOSIS — D509 Iron deficiency anemia, unspecified: Secondary | ICD-10-CM | POA: Diagnosis not present

## 2022-01-16 DIAGNOSIS — R509 Fever, unspecified: Secondary | ICD-10-CM | POA: Diagnosis not present

## 2022-01-16 DIAGNOSIS — E1165 Type 2 diabetes mellitus with hyperglycemia: Secondary | ICD-10-CM | POA: Diagnosis not present

## 2022-01-16 DIAGNOSIS — Z66 Do not resuscitate: Secondary | ICD-10-CM | POA: Diagnosis not present

## 2022-01-16 DIAGNOSIS — M810 Age-related osteoporosis without current pathological fracture: Secondary | ICD-10-CM | POA: Diagnosis not present

## 2022-01-16 DIAGNOSIS — E1151 Type 2 diabetes mellitus with diabetic peripheral angiopathy without gangrene: Secondary | ICD-10-CM | POA: Diagnosis not present

## 2022-01-16 DIAGNOSIS — D539 Nutritional anemia, unspecified: Secondary | ICD-10-CM | POA: Diagnosis not present

## 2022-01-16 DIAGNOSIS — R778 Other specified abnormalities of plasma proteins: Secondary | ICD-10-CM | POA: Diagnosis not present

## 2022-01-16 DIAGNOSIS — D849 Immunodeficiency, unspecified: Secondary | ICD-10-CM | POA: Diagnosis not present

## 2022-01-16 DIAGNOSIS — Z993 Dependence on wheelchair: Secondary | ICD-10-CM | POA: Diagnosis not present

## 2022-01-16 DIAGNOSIS — Z720 Tobacco use: Secondary | ICD-10-CM | POA: Diagnosis not present

## 2022-01-16 DIAGNOSIS — R001 Bradycardia, unspecified: Secondary | ICD-10-CM | POA: Diagnosis not present

## 2022-01-16 DIAGNOSIS — L89322 Pressure ulcer of left buttock, stage 2: Secondary | ICD-10-CM | POA: Diagnosis not present

## 2022-01-16 DIAGNOSIS — I248 Other forms of acute ischemic heart disease: Secondary | ICD-10-CM | POA: Diagnosis not present

## 2022-01-16 DIAGNOSIS — M3390 Dermatopolymyositis, unspecified, organ involvement unspecified: Secondary | ICD-10-CM | POA: Diagnosis not present

## 2022-01-16 DIAGNOSIS — Z794 Long term (current) use of insulin: Secondary | ICD-10-CM | POA: Diagnosis not present

## 2022-01-16 DIAGNOSIS — Z20822 Contact with and (suspected) exposure to covid-19: Secondary | ICD-10-CM | POA: Diagnosis not present

## 2022-01-16 DIAGNOSIS — E039 Hypothyroidism, unspecified: Secondary | ICD-10-CM | POA: Diagnosis not present

## 2022-01-16 DIAGNOSIS — B370 Candidal stomatitis: Secondary | ICD-10-CM | POA: Diagnosis not present

## 2022-01-16 DIAGNOSIS — E7800 Pure hypercholesterolemia, unspecified: Secondary | ICD-10-CM | POA: Diagnosis not present

## 2022-01-16 DIAGNOSIS — R55 Syncope and collapse: Secondary | ICD-10-CM | POA: Diagnosis not present

## 2022-01-16 DIAGNOSIS — F0390 Unspecified dementia without behavioral disturbance: Secondary | ICD-10-CM | POA: Diagnosis not present

## 2022-01-16 DIAGNOSIS — Z7952 Long term (current) use of systemic steroids: Secondary | ICD-10-CM | POA: Diagnosis not present

## 2022-01-16 LAB — CBC
HCT: 29.1 % — ABNORMAL LOW (ref 36.0–46.0)
Hemoglobin: 9.7 g/dL — ABNORMAL LOW (ref 12.0–15.0)
MCH: 33.2 pg (ref 26.0–34.0)
MCHC: 33.3 g/dL (ref 30.0–36.0)
MCV: 99.7 fL (ref 80.0–100.0)
Platelets: 225 10*3/uL (ref 150–400)
RBC: 2.92 MIL/uL — ABNORMAL LOW (ref 3.87–5.11)
RDW: 15 % (ref 11.5–15.5)
WBC: 9.7 10*3/uL (ref 4.0–10.5)
nRBC: 0 % (ref 0.0–0.2)

## 2022-01-16 LAB — URINALYSIS, ROUTINE W REFLEX MICROSCOPIC
Bacteria, UA: NONE SEEN
Bilirubin Urine: NEGATIVE
Glucose, UA: 150 mg/dL — AB
Hgb urine dipstick: NEGATIVE
Ketones, ur: 5 mg/dL — AB
Leukocytes,Ua: NEGATIVE
Nitrite: NEGATIVE
Protein, ur: 100 mg/dL — AB
Specific Gravity, Urine: 1.015 (ref 1.005–1.030)
pH: 6 (ref 5.0–8.0)

## 2022-01-16 LAB — COMPREHENSIVE METABOLIC PANEL
ALT: 29 U/L (ref 0–44)
AST: 34 U/L (ref 15–41)
Albumin: 2.5 g/dL — ABNORMAL LOW (ref 3.5–5.0)
Alkaline Phosphatase: 44 U/L (ref 38–126)
Anion gap: 8 (ref 5–15)
BUN: 13 mg/dL (ref 8–23)
CO2: 25 mmol/L (ref 22–32)
Calcium: 9.5 mg/dL (ref 8.9–10.3)
Chloride: 100 mmol/L (ref 98–111)
Creatinine, Ser: 0.72 mg/dL (ref 0.44–1.00)
GFR, Estimated: >60 mL/min (ref >60)
Glucose, Bld: 266 mg/dL — ABNORMAL HIGH (ref 70–99)
Potassium: 3.8 mmol/L (ref 3.5–5.1)
Sodium: 133 mmol/L — ABNORMAL LOW (ref 135–145)
Total Bilirubin: 0.8 mg/dL (ref 0.3–1.2)
Total Protein: 5.4 g/dL — ABNORMAL LOW (ref 6.5–8.1)

## 2022-01-16 LAB — GLUCOSE, CAPILLARY
Glucose-Capillary: 223 mg/dL — ABNORMAL HIGH (ref 70–99)
Glucose-Capillary: 271 mg/dL — ABNORMAL HIGH (ref 70–99)
Glucose-Capillary: 230 mg/dL — ABNORMAL HIGH (ref 70–99)
Glucose-Capillary: 140 mg/dL — ABNORMAL HIGH (ref 70–99)
Glucose-Capillary: 103 mg/dL — ABNORMAL HIGH (ref 70–99)
Glucose-Capillary: 132 mg/dL — ABNORMAL HIGH (ref 70–99)

## 2022-01-16 MED ORDER — INSULIN ASPART 100 UNIT/ML IJ SOLN
3.0000 [IU] | Freq: Once | INTRAMUSCULAR | Status: AC
Start: 2022-01-16 — End: 2022-01-16
  Administered 2022-01-16: 3 [IU] via SUBCUTANEOUS

## 2022-01-16 MED ORDER — INSULIN ASPART 100 UNIT/ML IJ SOLN
4.0000 [IU] | Freq: Three times a day (TID) | INTRAMUSCULAR | Status: DC
  Administered 2022-01-16 – 2022-01-20 (×13): 4 [IU] via SUBCUTANEOUS

## 2022-01-16 MED ORDER — SODIUM CHLORIDE 0.9 % IV SOLN
1.5000 g | Freq: Four times a day (QID) | INTRAVENOUS | Status: DC
  Administered 2022-01-16 – 2022-01-17 (×4): 1.5 g via INTRAVENOUS
  Filled 2022-01-16: qty 1.5
  Filled 2022-01-16: qty 4
  Filled 2022-01-16: qty 1.5
  Filled 2022-01-16 (×2): qty 4

## 2022-01-16 MED ORDER — METOPROLOL TARTRATE 25 MG PO TABS
25.0000 mg | ORAL_TABLET | Freq: Two times a day (BID) | ORAL | Status: DC
  Administered 2022-01-16 – 2022-01-20 (×9): 25 mg via ORAL
  Filled 2022-01-16 (×9): qty 1

## 2022-01-16 MED ORDER — INSULIN DETEMIR 100 UNIT/ML ~~LOC~~ SOLN
4.0000 [IU] | Freq: Two times a day (BID) | SUBCUTANEOUS | Status: DC
Start: 2022-01-16 — End: 2022-01-19
  Administered 2022-01-16 – 2022-01-19 (×6): 4 [IU] via SUBCUTANEOUS
  Filled 2022-01-16 (×7): qty 0.04

## 2022-01-16 MED ORDER — FLUCONAZOLE 100 MG PO TABS
100.0000 mg | ORAL_TABLET | Freq: Every day | ORAL | Status: DC
  Administered 2022-01-17: 100 mg via ORAL
  Filled 2022-01-16: qty 1

## 2022-01-16 MED ORDER — FLUCONAZOLE 100MG IVPB
100.0000 mg | Freq: Once | INTRAVENOUS | Status: AC
  Administered 2022-01-16: 100 mg via INTRAVENOUS
  Filled 2022-01-16: qty 50

## 2022-01-16 MED ORDER — PREDNISONE 1 MG PO TABS
12.0000 mg | ORAL_TABLET | Freq: Every day | ORAL | Status: DC
  Administered 2022-01-17 – 2022-01-20 (×4): 12 mg via ORAL
  Filled 2022-01-16 (×4): qty 2

## 2022-01-16 NOTE — Progress Notes (Signed)
Pt daughter came to get me because the pt was having another episode of shaking and redness on her face. Pt HR was in the 150s. It was not seizure activity, but the pt would get very tense and have whole body shakes in increments. It lasted off and on for about an hour. Pt stated she was not in any pain, but would not say much else. Rapid nurse, charge nurse, and on call Opyd notified.  Pt daughter states she does not want the pt to be on anything that may sedate her such as muscle relaxers or ativan. Daughter decided that we should just wait it out instead.  Pt was able to calm down and orders for labs were put in to see if there are any abnormalities.

## 2022-01-16 NOTE — Progress Notes (Signed)
Physical Therapy Treatment Patient Details Name: [name] [name] MRN: [id number] DOB: 08/30/1933 Today's Date: 01/16/2022   History of Present Illness Patient is 88 y.o. female who presented to ED from home on 6/27 from fainting while moving from bathroom to [facility] with daughter assisting. Daughter reports similar occurence 4 days prior. PMH significant for CVA, HTN, HLD, type II DM, PAD, hypothyroidism, recent diagnosis of dermatomyositis recently tappered off steroids.    PT Comments    Patient making limited progress due to fatigue and cognition. Pt continues to demonstrate slow processing and difficulty sequencing/initiating movement. Max+2 assist to pivot and sit up EOB. Pt demonstrated significant posterior lean sitting and extensor tone with clinic tremors in all four extremities. Unable to safely flex knees and bring feet to floor to prevent anterior sliding of hips, Total Assist to pivot pt with bed pad back to supine. Pt required Max assist to roll and place bed pan for BM. Will continue to progress as able. Discussed DC recs with pt's daughter and current mobility needs ot 2+ max assist, updated recs to SNF with 24/7 assist.     Recommendations for follow up therapy are one component of a multi-disciplinary discharge planning process, led by the attending physician.  Recommendations may be updated based on patient status, additional functional criteria and insurance authorization.  Follow Up Recommendations  Skilled nursing-short term rehab (<3 hours/day)     Assistance Recommended at Discharge Frequent or constant Supervision/Assistance  Patient can return home with the following Two people to help with walking and/or transfers;A lot of help with bathing/dressing/bathroom;Assistance with cooking/housework;Assistance with feeding;Direct supervision/assist for medications management;Direct supervision/assist for financial management;Help with stairs or ramp for entrance;Assist for  transportation   Equipment Recommendations  None recommended by PT    Recommendations for Other Services       Precautions / Restrictions Precautions Precautions: Fall Restrictions Weight Bearing Restrictions: No     Mobility  Bed Mobility Overal bed mobility: Needs Assistance Bed Mobility: Supine to Sit, Sit to Supine, Rolling Rolling: Max assist   Supine to sit: Max assist, HOB elevated, +2 for physical assistance, +2 for safety/equipment Sit to supine: +2 for physical assistance, +2 for safety/equipment, Total assist   General bed mobility comments: Pt attempted to reach hand toward rail to roll and transition supine to sit EOB. Bed bad used with Max+2 assist to pivot hips. Pt leaning posteriorly and moving into extensor tone/posture with clonic tremors noted in all four extremitites. Pt seemed anxious and fearful of falling. Total assist to prevent posterior trunk lean and to attempt to block LE's, unable to flex knees to place feet on floor. Pt returned to supine. and rolled to position bedpan for BM.    Transfers                        Ambulation/Gait                   Stairs             Wheelchair Mobility    Modified Rankin (Stroke Patients Only)       Balance Overall balance assessment: Needs assistance Sitting-balance support: Feet supported, Bilateral upper extremity supported Sitting balance-Leahy Scale: Poor Sitting balance - Comments: posterior lean and then flexed trunk, reliant on UE support and min assist to steady balance.   Standing balance support: Bilateral upper extremity supported Standing balance-Leahy Scale: Zero Standing balance comment: Max assist to maintain balance in standing  Cognition Arousal/Alertness: Awake/alert Behavior During Therapy: WFL for tasks assessed/performed Overall Cognitive Status: Within Functional Limits for tasks assessed                                           Exercises      General Comments        Pertinent Vitals/Pain Pain Assessment Breathing: normal Negative Vocalization: none Facial Expression: smiling or inexpressive Body Language: relaxed Consolability: no need to console PAINAD Score: 0    Home Living                          Prior Function            PT Goals (current goals can now be found in the care plan section) Acute Rehab PT Goals Patient Stated Goal: daughter wants to take pt home PT Goal Formulation: With family Time For Goal Achievement: 01/28/22 Potential to Achieve Goals: Fair Progress towards PT goals: Not progressing toward goals - comment (slow/limited)    Frequency    Min 3X/week (family wants to take pt home)      PT Plan Discharge plan needs to be updated    Co-evaluation              AM-PAC PT "6 Clicks" Mobility   Outcome Measure  Help needed turning from your back to your side while in a flat bed without using bedrails?: Total Help needed moving from lying on your back to sitting on the side of a flat bed without using bedrails?: Total Help needed moving to and from a bed to a chair (including a wheelchair)?: Total Help needed standing up from a chair using your arms (e.g., wheelchair or bedside chair)?: Total Help needed to walk in hospital room?: Total Help needed climbing 3-5 steps with a railing? : Total 6 Click Score: 6    End of Session Equipment Utilized During Treatment: Gait belt Activity Tolerance: Patient limited by fatigue Patient left: in bed;with call bell/phone within reach;with nursing/sitter in room;with family/visitor present;with bed alarm set Nurse Communication: Mobility status PT Visit Diagnosis: Muscle weakness (generalized) (M62.81);Other abnormalities of gait and mobility (R26.89);Difficulty in walking, not elsewhere classified (R26.2)     Time: 3818-2993 PT Time Calculation (min) (ACUTE ONLY): 18  min  Charges:  $Therapeutic Activity: 8-22 mins                     Verner Mould, DPT Acute Rehabilitation Services Office [phone] Pager [phone]  01/16/22 2:54 PM

## 2022-01-16 NOTE — Progress Notes (Signed)
[facility] PROGRESS NOTE    Progress Note  [name] [name]  HMC:[id number] DOB: [birth year] DOA: 01/13/2022 PCP: Lajean Manes, MD     Brief Narrative:   [name] [name] is an 88 y.o. female past medical history significant for essential hypertension, diabetes mellitus type 2 peripheral arterial disease, dermatomyositis comes in after a syncopal episode.  According to the daughter she was being assisted from the bathroom to the wheelchair, when she slumped over in the wheelchair and briefly lost consciousness there was no fall.  Here that she had a similar episode 4 days prior to admission.  Assessment/Plan:   Syncope: Cardiology interrogated the pacer showed bradycardia no atrial fibrillation Urine culture was inconclusive. Chest x-ray no acute cardiopulmonary disease. Echo no aortic stenosis EEG no evidence of seizures diffuse encephalopathy nonspecific. No events on telemetry. Physical therapy evaluated the patient and recommended skilled nursing facility.  Oral thrush: In the setting of steroids and antibiotics, started on IV Diflucan. Speech has been consultedRecommended full liquid diet. Can transition her to oral Diflucan tomorrow.  Fever of unknown source: She is on chronic steroids which is being tapered as an outpatient. She completed 3-day course of IV antibiotic for possible aspiration pneumonia for source of fever. Blood cultures negative till date.  Sinus tach: Continue metoprolol sinus tach improved.  Dermatomyositis: Methotrexate was held, steroids were continued as she has been on them chronically. She is currently on prednisone 15.  Diabetes mellitus type 2 uncontrolled with hyperglycemia: A1c of 8.9, change her sliding scale insulin to higher coverage with meal. Increase slight long-acting insulin  Macrocytic anemia: Hemoglobin has been remaining stable, currently 10.6.  Hypothyroidism, acquired Continue current home regimen.  Essential  hypertension: Home regimen was continued, her blood pressure slightly elevated   GERD: Continue current home regimen.  Elevated troponins: Denies chest pain, twelve-lead EKG showed no signs of ischemia. Cardiac biomarkers basically remained flat.  Left buttock ulcer present on admission RN Pressure Injury Documentation: Pressure Injury 12/08/16 Stage II -  Partial thickness loss of dermis presenting as a shallow open ulcer with a red, pink wound bed without slough. (Active)  12/08/16 0810  Location: Buttocks  Location Orientation: Left  Staging: Stage II -  Partial thickness loss of dermis presenting as a shallow open ulcer with a red, pink wound bed without slough.  Wound Description (Comments):   Present on Admission: No    Estimated body mass index is 20.72 kg/m as calculated from the following:   Height as of this encounter: '5\' 7"'$  (1.702 m).   Weight as of this encounter: 60 kg.   DVT prophylaxis: lovenox Family Communication: Daughters Status is: Observation The patient remains OBS appropriate and will d/c before 2 midnights.    Code Status:     Code Status Orders  (From admission, onward)           Start     Ordered   01/13/22 1557  Do not attempt resuscitation (DNR)  Continuous       Question Answer Comment  In the event of cardiac or respiratory ARREST Do not call a "code blue"   In the event of cardiac or respiratory ARREST Do not perform Intubation, CPR, defibrillation or ACLS   In the event of cardiac or respiratory ARREST Use medication by any route, position, wound care, and other measures to relive pain and suffering. May use oxygen, suction and manual treatment of airway obstruction as needed for comfort.      01/13/22 1556  Code Status History     Date Active Date Inactive Code Status Order ID Comments User Context   09/13/2021 1921 09/16/2021 1758 DNR [id number]  Lavina Hamman, MD ED   09/13/2021 1901 09/13/2021 1921 Full Code  [id number]  Lavina Hamman, MD ED   10/08/2019 1040 10/12/2019 1639 Full Code [id number]  Vonzella Nipple, NP ED   08/21/2019 1118 08/23/2019 2332 Full Code [id number]  Norval Morton, MD ED   08/18/2019 0602 08/20/2019 1748 Full Code [id number]  Shela Leff, MD Inpatient   11/26/2016 0048 12/09/2016 1943 Full Code [id number]  Edwin Dada, MD Inpatient   06/10/2016 1355 06/11/2016 1537 Full Code [id number]  Rondel Jumbo, PA-C ED      Advance Directive Documentation    Flowsheet Row Most Recent Value  Type of Advance Directive Healthcare Power of Attorney  Pre-existing out of facility DNR order (yellow form or pink MOST form) --  "MOST" Form in Place? --         IV Access:   Peripheral IV   Procedures and diagnostic studies:   ECHOCARDIOGRAM COMPLETE  Result Date: 01/14/2022    ECHOCARDIOGRAM REPORT   Patient Name:   [name] [name] Date of Exam: 01/14/2022 Medical Rec #:  [id number]       Height:       67.0 in Accession #:    [id number]      Weight:       117.7 lb Date of Birth:  [birth year]       BSA:          1.614 m Patient Age:    89 years        BP:           160/69 mmHg Patient Gender: F               HR:           77 bpm. Exam Location:  Inpatient Procedure: 2D Echo, Cardiac Doppler and Color Doppler Indications:    Syncope  History:        Patient has prior history of Echocardiogram examinations. Risk                 Factors:Hypertension and Diabetes.  Sonographer:    Jyl Heinz Referring Phys: [id number] Jonnie Finner  Sonographer Comments: Pt supine IMPRESSIONS  1. Left ventricular ejection fraction, by estimation, is 60 to 65%. The left ventricle has normal function. The left ventricle has no regional wall motion abnormalities. Left ventricular diastolic parameters are consistent with Grade I diastolic dysfunction (impaired relaxation).  2. Right ventricular systolic function is normal. The right ventricular size is normal.  3. The mitral valve is degenerative. No  evidence of mitral valve regurgitation. No evidence of mitral stenosis.  4. The aortic valve is normal in structure. Aortic valve regurgitation is not visualized. No aortic stenosis is present.  5. The inferior vena cava is normal in size with greater than 50% respiratory variability, suggesting right atrial pressure of 3 mmHg. FINDINGS  Left Ventricle: Left ventricular ejection fraction, by estimation, is 60 to 65%. The left ventricle has normal function. The left ventricle has no regional wall motion abnormalities. The left ventricular internal cavity size was normal in size. There is  no left ventricular hypertrophy. Left ventricular diastolic parameters are consistent with Grade I diastolic dysfunction (impaired relaxation). Right Ventricle: The right ventricular size is normal. No increase in right ventricular wall thickness. Right ventricular systolic function is  normal. Left Atrium: Left atrial size was normal in size. Right Atrium: Right atrial size was normal in size. Pericardium: There is no evidence of pericardial effusion. Mitral Valve: The mitral valve is degenerative in appearance. There is mild thickening of the mitral valve leaflet(s). There is mild calcification of the mitral valve leaflet(s). Mild mitral annular calcification. No evidence of mitral valve regurgitation. No evidence of mitral valve stenosis. Tricuspid Valve: The tricuspid valve is normal in structure. Tricuspid valve regurgitation is not demonstrated. No evidence of tricuspid stenosis. Aortic Valve: The aortic valve is normal in structure. Aortic valve regurgitation is not visualized. No aortic stenosis is present. Aortic valve peak gradient measures 8.0 mmHg. Pulmonic Valve: The pulmonic valve was normal in structure. Pulmonic valve regurgitation is not visualized. No evidence of pulmonic stenosis. Aorta: The aortic root is normal in size and structure. Venous: The inferior vena cava is normal in size with greater than 50%  respiratory variability, suggesting right atrial pressure of 3 mmHg. IAS/Shunts: No atrial level shunt detected by color flow Doppler.  LEFT VENTRICLE PLAX 2D LVIDd:         3.50 cm     Diastology LVIDs:         2.30 cm     LV e' medial:    6.53 cm/s LV PW:         1.00 cm     LV E/e' medial:  10.1 LV IVS:        1.30 cm     LV e' lateral:   5.55 cm/s LVOT diam:     2.00 cm     LV E/e' lateral: 11.9 LV SV:         67 LV SV Index:   41 LVOT Area:     3.14 cm  LV Volumes (MOD) LV vol d, MOD A2C: 56.3 ml LV vol d, MOD A4C: 54.6 ml LV vol s, MOD A2C: 19.8 ml LV vol s, MOD A4C: 20.8 ml LV SV MOD A2C:     36.5 ml LV SV MOD A4C:     54.6 ml LV SV MOD BP:      34.9 ml RIGHT VENTRICLE             IVC RV Basal diam:  3.00 cm     IVC diam: 1.20 cm RV Mid diam:    2.60 cm RV S prime:     11.70 cm/s TAPSE (M-mode): 1.9 cm LEFT ATRIUM             Index        RIGHT ATRIUM           Index LA diam:        2.60 cm 1.61 cm/m   RA Area:     10.80 cm LA Vol (A2C):   30.6 ml 18.95 ml/m  RA Volume:   21.70 ml  13.44 ml/m LA Vol (A4C):   27.9 ml 17.28 ml/m LA Biplane Vol: 31.0 ml 19.20 ml/m  AORTIC VALVE AV Area (Vmax): 2.50 cm AV Vmax:        141.00 cm/s AV Peak Grad:   8.0 mmHg LVOT Vmax:      112.00 cm/s LVOT Vmean:     84.700 cm/s LVOT VTI:       0.212 m  AORTA Ao Root diam: 2.90 cm Ao Asc diam:  2.30 cm MITRAL VALVE               TRICUSPID VALVE MV Area (  PHT): 3.68 cm    TR Peak grad:   35.8 mmHg MV Decel Time: 206 msec    TR Vmax:        299.00 cm/s MR Peak grad: 87.8 mmHg MR Vmax:      468.50 cm/s  SHUNTS MV E velocity: 66.00 cm/s  Systemic VTI:  0.21 m MV A velocity: 80.10 cm/s  Systemic Diam: 2.00 cm MV E/A ratio:  0.82 Candee Furbish MD Electronically signed by Candee Furbish MD Signature Date/Time: 01/14/2022/4:58:44 PM    Final    EEG adult  Result Date: 01/14/2022 Lora Havens, MD     01/14/2022  7:03 PM Patient Name: [name] [name] MRN: [id number] Epilepsy Attending: Lora Havens Referring Physician/Provider:  Jonnie Finner, DO Date: 01/14/2022 Duration: 22.36 mins Patient history: 88yo F with syncope. EEG to evaluate for seizure. Level of alertness: Awake AEDs during EEG study:LEV Technical aspects: This EEG study was done with scalp electrodes positioned according to the 10-20 International system of electrode placement. Electrical activity was acquired at a sampling rate of '500Hz'$  and reviewed with a high frequency filter of '70Hz'$  and a low frequency filter of '1Hz'$ . EEG data were recorded continuously and digitally stored. Description: The posterior dominant rhythm consists of 8 Hz activity of moderate voltage (25-35 uV) seen predominantly in posterior head regions, symmetric and reactive to eye opening and eye closing. EEG showed intermittent generalized 3 to 6 Hz theta-delta slowing. Hyperventilation and photic stimulation were not performed.   ABNORMALITY - Intermittent slow, generalized IMPRESSION: This study is suggestive of mild diffuse encephalopathy, nonspecific etiology. No seizures or epileptiform discharges were seen throughout the recording. [employer]:   None.   Subjective:    [name] [name] tolerating her diet today   Objective:    Vitals:   01/16/22 0420 01/16/22 0522 01/16/22 0625 01/16/22 0814  BP: (!) 161/108 (!) 166/70 (!) 143/91 (!) 150/73  Pulse: (!) 107 (!) 103 (!) 111 (!) 102  Resp: 16 18 (!) 22 18  Temp: 100 F (37.8 C) 99.7 F (37.6 C) 98.5 F (36.9 C) 99.1 F (37.3 C)  TempSrc: Oral Oral Oral Oral  SpO2: 95% 94% 92% 96%  Weight:      Height:       SpO2: 96 %   Intake/Output Summary (Last 24 hours) at 01/16/2022 0834 Last data filed at 01/16/2022 0626 Gross per 24 hour  Intake 1160.25 ml  Output 1850 ml  Net -689.75 ml    Filed Weights   01/14/22 0500 01/15/22 0500 01/16/22 0323  Weight: 53.4 kg 54 kg 60 kg    Exam: General exam: In no acute distress. Respiratory system: Good air movement and clear to  auscultation. Cardiovascular system: S1 & S2 heard, RRR. No JVD. Gastrointestinal system: Abdomen is nondistended, soft and nontender.  Skin: No rashes, lesions or ulcers Psychiatry: Judgement and insight appear normal. Mood & affect appropriate.   Data Reviewed:    Labs: Basic Metabolic Panel: Recent Labs  Lab 01/09/22 1508 01/13/22 1208 01/15/22 0440 01/16/22 0453  NA 130* 136  --  133*  K 4.6 4.1  --  3.8  CL 95* 99  --  100  CO2 24 24  --  25  GLUCOSE 152* 312*  --  266*  BUN 36* 26*  --  13  CREATININE 1.04* 0.86 0.72 0.72  CALCIUM 10.0 9.9  --  9.5    GFR Estimated Creatinine Clearance: 48.7  mL/min (by C-G formula based on SCr of 0.72 mg/dL). Liver Function Tests: Recent Labs  Lab 01/13/22 1208 01/16/22 0453  AST 23 34  ALT 27 29  ALKPHOS 42 44  BILITOT 1.0 0.8  PROT 6.1* 5.4*  ALBUMIN 3.2* 2.5*    No results for input(s): "LIPASE", "AMYLASE" in the last 168 hours. No results for input(s): "AMMONIA" in the last 168 hours. Coagulation profile No results for input(s): "INR", "PROTIME" in the last 168 hours. COVID-19 Labs  No results for input(s): "DDIMER", "FERRITIN", "LDH", "CRP" in the last 72 hours.  Lab Results  Component Value Date   SARSCOV2NAA NEGATIVE 01/13/2022   SARSCOV2NAA NEGATIVE 11/21/2021   SARSCOV2NAA POSITIVE (A) 09/14/2021   SARSCOV2NAA POSITIVE (A) 09/13/2021    CBC: Recent Labs  Lab 01/09/22 1508 01/13/22 1208 01/16/22 0453  WBC 7.4 11.4* 9.7  NEUTROABS 6.6 9.2*  --   HGB 9.8* 10.6* 9.7*  HCT 29.6* 32.2* 29.1*  MCV 102.4* 102.5* 99.7  PLT 258 209 225    Cardiac Enzymes: No results for input(s): "CKTOTAL", "CKMB", "CKMBINDEX", "TROPONINI" in the last 168 hours. BNP (last 3 results) No results for input(s): "PROBNP" in the last 8760 hours. CBG: Recent Labs  Lab 01/15/22 1729 01/15/22 2049 01/16/22 0225 01/16/22 0519 01/16/22 0801  GLUCAP 148* 98 223* 271* 230*    D-Dimer: No results for input(s): "DDIMER"  in the last 72 hours. Hgb A1c: Recent Labs    01/13/22 1603  HGBA1C 9.0*    Lipid Profile: No results for input(s): "CHOL", "HDL", "LDLCALC", "TRIG", "CHOLHDL", "LDLDIRECT" in the last 72 hours. Thyroid function studies: No results for input(s): "TSH", "T4TOTAL", "T3FREE", "THYROIDAB" in the last 72 hours.  Invalid input(s): "FREET3" Anemia work up: No results for input(s): "VITAMINB12", "FOLATE", "FERRITIN", "TIBC", "IRON", "RETICCTPCT" in the last 72 hours. Sepsis Labs: Recent Labs  Lab 01/09/22 1508 01/13/22 1208 01/16/22 0453  WBC 7.4 11.4* 9.7  LATICACIDVEN  --  1.7  --     Microbiology Recent Results (from the past 240 hour(s))  Urine Culture     Status: Abnormal   Collection Time: 01/13/22  1:10 PM   Specimen: Urine, Clean Catch  Result Value Ref Range Status   Specimen Description   Final    URINE, CLEAN CATCH Performed at [facility], [place] [address] [place], [place] [address]    Special Requests   Final    URINE CUP ONLY Performed at [facility], [place] [address], [place], [place] [address]    Culture (A)  Final    <10,000 COLONIES/mL INSIGNIFICANT GROWTH Performed at [place] [address], [place], [place] [address]    Report Status 01/15/2022 FINAL  Final  Resp Panel by RT-PCR (Flu A&B, Covid) Anterior Nasal Swab     Status: None   Collection Time: 01/13/22  1:28 PM   Specimen: Anterior Nasal Swab  Result Value Ref Range Status   SARS Coronavirus 2 by RT PCR NEGATIVE NEGATIVE Final    Comment: (NOTE) SARS-CoV-2 target nucleic acids are NOT DETECTED.  The SARS-CoV-2 RNA is generally detectable in upper respiratory specimens during the acute phase of infection. The lowest concentration of SARS-CoV-2 viral copies this assay can detect is 138 copies/mL. A negative result does not preclude SARS-Cov-2 infection and should not be used as the sole basis for treatment or other patient management decisions.  A negative result may occur with  improper specimen collection/handling, submission of specimen other than nasopharyngeal swab,  presence of viral mutation(s) within the areas targeted by this assay, and inadequate number of viral copies(<138 copies/mL). A negative result must be combined with clinical observations, patient history, and epidemiological information. The expected result is Negative.  Fact Sheet for Patients:  EntrepreneurPulse.com.au  Fact Sheet for Healthcare Providers:  IncredibleEmployment.be  This test is no t yet approved or cleared by the [place] FDA and  has been authorized for detection and/or diagnosis of SARS-CoV-2 by FDA under an Emergency Use Authorization (EUA). This EUA will remain  in effect (meaning this test can be used) for the duration of the COVID-19 declaration under Section 564(b)(1) of the Act, 21 U.S.C.section 360bbb-3(b)(1), unless the authorization is terminated  or revoked sooner.       Influenza A by PCR NEGATIVE NEGATIVE Final   Influenza B by PCR NEGATIVE NEGATIVE Final    Comment: (NOTE) The Xpert Xpress SARS-CoV-2/FLU/RSV plus assay is intended as an aid in the diagnosis of influenza from Nasopharyngeal swab specimens and should not be used as a sole basis for treatment. Nasal washings and aspirates are unacceptable for Xpert Xpress SARS-CoV-2/FLU/RSV testing.  Fact Sheet for Patients: EntrepreneurPulse.com.au  Fact Sheet for Healthcare Providers: IncredibleEmployment.be  This test is not yet approved or cleared by the [place] FDA and has been authorized for detection and/or diagnosis of SARS-CoV-2 by FDA under an Emergency Use Authorization (EUA). This EUA will remain in effect (meaning this test can be used) for the duration of the COVID-19 declaration under Section 564(b)(1) of the Act, 21 U.S.C. section 360bbb-3(b)(1), unless the authorization  is terminated or revoked.  Performed at [facility], [place] [address] [place], [place] [address]   Culture, blood (routine x 2)     Status: None (Preliminary result)   Collection Time: 01/13/22  2:14 PM   Specimen: BLOOD  Result Value Ref Range Status   Specimen Description   Final    BLOOD BLOOD RIGHT ARM UPPER ARM Performed at [place] [address], [place], [place] [address]    Special Requests   Final    BOTTLES DRAWN AEROBIC AND ANAEROBIC Blood Culture adequate volume Performed at [place] [address] [place], [place] [address]    Culture   Final    NO GROWTH 2 DAYS Performed at [place] [address] [place], [place] [address]    Report Status PENDING  Incomplete  Culture, blood (routine x 2)     Status: None (Preliminary result)   Collection Time: 01/13/22  2:20 PM   Specimen: BLOOD LEFT WRIST  Result Value Ref Range Status   Specimen Description   Final    BLOOD LEFT WRIST Performed at [place] [address], [place], [place] [address]    Special Requests   Final    BOTTLES DRAWN AEROBIC AND ANAEROBIC Blood Culture adequate volume Performed at [place] [facility] [address], [place], [place] [address]    Culture   Final    NO GROWTH 2 DAYS Performed at [facility] [address], [place],  [address]    Report Status PENDING  Incomplete     Medications:    amLODipine  2.5 mg Oral Daily   docusate sodium  100 mg Oral BID   famotidine  10 mg Oral QHS   feeding supplement (GLUCERNA SHAKE)  237 mL Oral TID BM   folic acid  1 mg Oral QPM  insulin aspart  0-5 Units Subcutaneous QHS   insulin aspart  0-9 Units Subcutaneous TID WC   insulin aspart  3 Units Subcutaneous TID WC   insulin detemir  3 Units Subcutaneous QHS   insulin detemir  5 Units Subcutaneous Daily   levETIRAcetam  500 mg Oral BID   levothyroxine  200 mcg  Oral QAC breakfast   lidocaine  1 patch Transdermal Q24H   losartan  50 mg Oral Daily   metoprolol tartrate  12.5 mg Oral BID   omega-3 acid ethyl esters  1 g Oral BID   predniSONE  15 mg Oral Q breakfast   sodium chloride flush  3 mL Intravenous Q12H   Continuous Infusions:  sodium chloride 100 mL/hr at 01/15/22 1040   ampicillin-sulbactam (UNASYN) IV 1.5 g (01/16/22 0523)   fluconazole (DIFLUCAN) IV     fluconazole (DIFLUCAN) IV 200 mg (01/15/22 1719)      LOS: 0 days   Charlynne Cousins  [facility]  01/16/2022, 8:34 AM

## 2022-01-16 NOTE — Progress Notes (Signed)
Pt daughter asked me to remove pt dentures because the pt has thrush and has not had her dentures out the whole time shes been here and she was concerned about the pt being able to heal properly.  I expressed that I understood the pts daughter concerns and proceeded to help the pt get her dentures out. Dentures were placed in the denture cup and pt mouth was swabbed with mouthwash.

## 2022-01-16 NOTE — Progress Notes (Signed)
   01/16/22 0949  Assess: MEWS Score  Temp (!) 101.1 F (38.4 C)  BP (!) 148/64  MAP (mmHg) 89  Pulse Rate (!) 107  Level of Consciousness Alert  SpO2 94 %  Assess: MEWS Score  MEWS Temp 1  MEWS Systolic 0  MEWS Pulse 1  MEWS RR 0  MEWS LOC 0  MEWS Score 2  MEWS Score Color Yellow  Assess: if the MEWS score is Yellow or Red  Were vital signs taken at a resting state? Yes  Focused Assessment No change from prior assessment  Does the patient meet 2 or more of the SIRS criteria? Yes  Does the patient have a confirmed or suspected source of infection? No  Treat  Pain Scale PAINAD  Breathing 0  Negative Vocalization 0  Facial Expression 0  Body Language 0  Consolability 0  PAINAD Score 0  Take Vital Signs  Increase Vital Sign Frequency  Yellow: Q 2hr X 2 then Q 4hr X 2, if remains yellow, continue Q 4hrs  Notify: Charge Nurse/RN  Name of Charge Nurse/RN Notified Iffy O RN  Date Charge Nurse/RN Notified 01/16/22  Time Charge Nurse/RN Notified 0949  Assess: SIRS CRITERIA  SIRS Temperature  1  SIRS Pulse 1  SIRS Respirations  0  SIRS WBC 0  SIRS Score Sum  2

## 2022-01-16 NOTE — Progress Notes (Signed)
Speech Language Pathology Treatment: Dysphagia  Patient Details Name: [name] [name] MRN: [id number] DOB: 08-19-1933 Today's Date: 01/16/2022 Time: 1745-1800 SLP Time Calculation (min) (ACUTE ONLY): 15 min  Assessment / Plan / Recommendation Clinical Impression  Purpose of visit was dysphagia management. Pt lethargic therefore did not observe po intake. Daughter, [name] [name], present and reports pt consumed Glucerna for dinner.  Elisa reports pt pockets food when she is ill, otherwise she feeds herself at home and does well with her eating. Advised Elisa to SLP role of dysphagia/aspiration mitigation and importance of controlling/modifying factors that are within control to help maximize airway protection.  Educated Elisa to compensation strategies, various changes with progression of dementia including gustatory changes - means of improving flavor of foods to for palatiblity.  Elisa states she would like her mother to stay on a liquid diet for a while - discussed protein shakes, etc and modifying pt's po based on her clinical changes/needs.    Note results of CXR from today and pt's bedridden status likely contributes to her pna risk.  Reviewed prior MBS, heimilch manuever, neuro input with self feeding.  Per Elisa, pt has myositis flares - and suspect this contributes to dysphagia when pt is ill.  All education completed for compensations using teach back, No SLP follow up .Daugter, [name] [name], reports being comfortable with the education provided.    HPI HPI: Patient is 88 y.o. female who presented to ED from home on 6/27 from fainting while moving from bathroom to [facility] with daughter assisting. Daughter reports similar occurence 4 days prior. PMH significant for CVA, HTN, HLD, type II DM, PAD, hypothyroidism, recent diagnosis of dermatomyositis recently tappered off steroids.  OT saw pt and reported she was pocketing her food, Swallow eval ordered.  Pt's diet was changed to full liquid and follow up indicated for  family education and to assure tolerance. RN reports pt swallowing medications ok and is not consuming much.      SLP Plan  All goals met      Recommendations for follow up therapy are one component of a multi-disciplinary discharge planning process, led by the attending physician.  Recommendations may be updated based on patient status, additional functional criteria and insurance authorization.    Recommendations  Diet recommendations: Thin liquid;Nectar-thick liquid (full liquids for now, advance at home as tolerated) Medication Administration: Whole meds with puree Compensations: Slow rate;Small sips/bites;Monitor for anterior loss                Oral Care Recommendations: Oral care BID Follow Up Recommendations: Follow physician's recommendations for discharge plan and follow up therapies Assistance recommended at discharge: Frequent or constant Supervision/Assistance SLP Visit Diagnosis: Dysphagia, oropharyngeal phase (R13.12) Plan: All goals met          Kathleen Lime, MS [place] Office [phone] Pager [phone]  Macario Golds  01/16/2022, 6:18 PM

## 2022-01-17 DIAGNOSIS — I10 Essential (primary) hypertension: Secondary | ICD-10-CM | POA: Diagnosis not present

## 2022-01-17 DIAGNOSIS — R509 Fever, unspecified: Secondary | ICD-10-CM | POA: Diagnosis not present

## 2022-01-17 DIAGNOSIS — R55 Syncope and collapse: Secondary | ICD-10-CM | POA: Diagnosis not present

## 2022-01-17 DIAGNOSIS — R778 Other specified abnormalities of plasma proteins: Secondary | ICD-10-CM | POA: Diagnosis not present

## 2022-01-17 LAB — GLUCOSE, CAPILLARY
Glucose-Capillary: 182 mg/dL — ABNORMAL HIGH (ref 70–99)
Glucose-Capillary: 185 mg/dL — ABNORMAL HIGH (ref 70–99)
Glucose-Capillary: 124 mg/dL — ABNORMAL HIGH (ref 70–99)
Glucose-Capillary: 137 mg/dL — ABNORMAL HIGH (ref 70–99)
Glucose-Capillary: 113 mg/dL — ABNORMAL HIGH (ref 70–99)

## 2022-01-17 LAB — URINE CULTURE: Culture: NO GROWTH

## 2022-01-17 MED ORDER — VANCOMYCIN HCL IN DEXTROSE 1-5 GM/200ML-% IV SOLN
1000.0000 mg | Freq: Once | INTRAVENOUS | Status: AC
  Administered 2022-01-17: 1000 mg via INTRAVENOUS
  Filled 2022-01-17: qty 200

## 2022-01-17 MED ORDER — LORAZEPAM 2 MG/ML IJ SOLN
0.2500 mg | Freq: Once | INTRAMUSCULAR | Status: DC | PRN
Start: 2022-01-17 — End: 2022-01-20

## 2022-01-17 MED ORDER — VANCOMYCIN HCL IN DEXTROSE 1-5 GM/200ML-% IV SOLN
1000.0000 mg | INTRAVENOUS | Status: DC
  Administered 2022-01-17 – 2022-01-19 (×3): 1000 mg via INTRAVENOUS
  Filled 2022-01-17 (×3): qty 200

## 2022-01-17 MED ORDER — SODIUM CHLORIDE 0.9 % IV SOLN
3.0000 g | Freq: Four times a day (QID) | INTRAVENOUS | Status: DC
  Administered 2022-01-17 – 2022-01-20 (×13): 3 g via INTRAVENOUS
  Filled 2022-01-17 (×14): qty 8

## 2022-01-17 MED ORDER — AMLODIPINE BESYLATE 5 MG PO TABS
5.0000 mg | ORAL_TABLET | Freq: Every day | ORAL | Status: DC
  Administered 2022-01-18 – 2022-01-20 (×3): 5 mg via ORAL
  Filled 2022-01-17 (×3): qty 1

## 2022-01-17 NOTE — Progress Notes (Signed)
Pharmacy Antibiotic Note  [name] [name] is a 88 y.o. female admitted on 01/13/2022 with syncope and FUO. Started on vanc/cefepime per pharmacy, which was later narrowed to Unasyn; with fluconazole added for thrush. Now with new fevers > 24 hr and Pharmacy has been re-consulted for vancomycin dosing.   Today, 01/17/2022: Now spiking fevers > 101 for a second consecutive day WBC normalized as of yesterday; not rechecked this AM SCr stable WNL; at baseline Repeat Cx from 6/30 still in process Resuming vancomycin  Plan: Vancomycin 1000 mg IV q24 hr (est AUC 516 based on SCr 0.8; Vd 0.72) Measure vancomycin AUC at steady state as indicated SCr q48 while on vanc Will increase Unasyn to full-dose 3g IV q6 hr given recurrent fevers Continue oral fluconazole per MD; dosing appropriate  Height: '5\' 7"'$  (170.2 cm) Weight: 60 kg (132 lb 4.4 oz) IBW/kg (Calculated) : 61.6  Temp (24hrs), Avg:99.3 F (37.4 C), Min:97.7 F (36.5 C), Max:101.7 F (38.7 C)  Recent Labs  Lab 01/13/22 1208 01/15/22 0440 01/16/22 0453  WBC 11.4*  --  9.7  CREATININE 0.86 0.72 0.72  LATICACIDVEN 1.7  --   --      Estimated Creatinine Clearance: 48.7 mL/min (by C-G formula based on SCr of 0.72 mg/dL).    Allergies  Allergen Reactions   Nsaids Other (See Comments)    No nsaids due to kidney function!!!! Especially ibuprofen or Aleve.   Biaxin [Clarithromycin] Hives   Sulfa Antibiotics Hives and Other (See Comments)    NO sulfa-based meds!!   Bactrim [Sulfamethoxazole-Trimethoprim] Hives and Rash   Lisinopril Cough    Antimicrobials this admission: 6/27 Vanco >> 6/29, resume 7/1 >>  6/27 Cefepime >> 6/29 Unasyn >>  6/30 Fluconazole >> (PO on 7/1) >>   Dose adjustments this admission: 7/1 incr Unasyn to 3g q6 given recurrent fevers  Microbiology results: 6/27 BCx: ngtd 6/27 UCx: insignificant growth 6/30 rpt BCx: sent 6/30 rpt UCx: sent  Janece Laidlaw A, PharmD 01/17/2022 9:21 AM

## 2022-01-17 NOTE — Progress Notes (Signed)
Asked to look at pt having severe tremors.  Pt was admitted for this reason as well.  Pt VSS.  Pulses intact, pupils wnl, and breath sounds decreased.  Pt able to follow simple commands during tremors. TRH1 MD informed not a lot to offer, small dose of Ativan ordered patient daughter refused to let patient take Ativan afraid it would mask her MOM symptoms.  Thus patient would be sent home without a diagnosis.  Primary RN to continue to monitor patient call if there is a need.  Absolutely don't give Ativan to patient unless daughter gives her approval.  Thanks

## 2022-01-17 NOTE — Progress Notes (Signed)
[facility] PROGRESS NOTE    Progress Note  [name] [name]  XQJ:[id number] DOB: [birth year] DOA: 01/13/2022 PCP: Lajean Manes, MD     Brief Narrative:   [name] [name] is an 88 y.o. female past medical history significant for essential hypertension, diabetes mellitus type 2 peripheral arterial disease, dermatomyositis comes in after a syncopal episode.  According to the daughter she was being assisted from the bathroom to the wheelchair, when she slumped over in the wheelchair and briefly lost consciousness there was [name] fall.  Here that she had a similar episode 4 days prior to admission.  Assessment/Plan:   Syncope: Cardiology interrogated the pacer showed bradycardia [name] atrial fibrillation Chest x-ray [name] acute cardiopulmonary disease. Echo [name] aortic stenosis EEG [name] evidence of seizures diffuse encephalopathy nonspecific. [name] events on telemetry. Physical therapy evaluated the patient and recommended skilled nursing facility.  Oral thrush: In the setting of steroids and antibiotics, started on IV Diflucan. Speech has been consultedRecommended full liquid diet. Can transition her to oral Diflucan tomorrow.  Aspiration pneumonia: She is on chronic steroids which is being tapered as an outpatient. Central back to IV vancomycin continue IV Unasyn. Chest x-ray showed new right lung base consolidation Speech evaluation, continue full liquid diet.   Sinus tach: Continue metoprolol sinus tach improved.  Dermatomyositis: Methotrexate was held, steroids were continued as she has been on them chronically. She is currently on prednisone 15.  Diabetes mellitus type 2 uncontrolled with hyperglycemia: A1c of 8.9, change her sliding scale insulin to higher coverage with meal. Increase slight long-acting insulin  Macrocytic anemia: Hemoglobin has been remaining stable, currently 10.6.  Hypothyroidism, acquired Continue current home regimen.  Essential hypertension: Home  regimen was continued, her blood pressure slightly elevated   GERD: Continue current home regimen.  Elevated troponins: Denies chest pain, twelve-lead EKG showed [name] signs of ischemia. Cardiac biomarkers basically remained flat.  Left buttock ulcer present on admission RN Pressure Injury Documentation: Pressure Injury 12/08/16 Stage II -  Partial thickness loss of dermis presenting as a shallow open ulcer with a red, pink wound bed without slough. (Active)  12/08/16 0810  Location: Buttocks  Location Orientation: Left  Staging: Stage II -  Partial thickness loss of dermis presenting as a shallow open ulcer with a red, pink wound bed without slough.  Wound Description (Comments):   Present on Admission: [name]    Estimated body mass index is 20.72 kg/m as calculated from the following:   Height as of this encounter: '5\' 7"'$  (1.702 m).   Weight as of this encounter: 60 kg.   DVT prophylaxis: lovenox Family Communication: Daughters Status is: Observation The patient remains OBS appropriate and will d/c before 2 midnights.    Code Status:     Code Status Orders  (From admission, onward)           Start     Ordered   01/13/22 1557  Do not attempt resuscitation (DNR)  Continuous       Question Answer Comment  In the event of cardiac or respiratory ARREST Do not call a "code blue"   In the event of cardiac or respiratory ARREST Do not perform Intubation, CPR, defibrillation or ACLS   In the event of cardiac or respiratory ARREST Use medication by any route, position, wound care, and other measures to relive pain and suffering. May use oxygen, suction and manual treatment of airway obstruction as needed for comfort.      01/13/22 1556  Code Status History     Date Active Date Inactive Code Status Order ID Comments User Context   09/13/2021 1921 09/16/2021 1758 DNR [id number]  Lavina Hamman, MD ED   09/13/2021 1901 09/13/2021 1921 Full Code [id number]  Lavina Hamman, MD ED   10/08/2019 1040 10/12/2019 1639 Full Code [id number]  Vonzella Nipple, NP ED   08/21/2019 1118 08/23/2019 2332 Full Code [id number]  Norval Morton, MD ED   08/18/2019 0602 08/20/2019 1748 Full Code [id number]  Shela Leff, MD Inpatient   11/26/2016 0048 12/09/2016 1943 Full Code [id number]  Edwin Dada, MD Inpatient   06/10/2016 1355 06/11/2016 1537 Full Code [id number]  Rondel Jumbo, PA-C ED      Advance Directive Documentation    Flowsheet Row Most Recent Value  Type of Advance Directive Healthcare Power of Attorney  Pre-existing out of facility DNR order (yellow form or pink MOST form) --  "MOST" Form in Place? --         IV Access:   Peripheral IV   Procedures and diagnostic studies:   DG Chest 1 View  Result Date: 01/16/2022 CLINICAL DATA:  Provided history: Encounter for fever. EXAM: CHEST  1 VIEW COMPARISON:  Chest radiographs 01/13/2022 and earlier. FINDINGS: Loop recorder device. A small object overlies the right lung base, likely overlying the patient. Heart size within normal limits. Aortic atherosclerosis. Ill-defined opacities within the left greater than right lung bases which may reflect atelectasis and/or pneumonia. Small left pleural effusion. [name] acute bony abnormality identified. IMPRESSION: Ill-defined opacities within the left greater than right lung bases, new from the prior chest radiograph of 01/13/2022. Findings may reflect atelectasis and/or pneumonia. Small left pleural effusion, also new from the prior exam. Aortic Atherosclerosis (ICD10-I70.0). Electronically Signed   By: Kellie Simmering D.O.   On: 01/16/2022 11:27     Medical Consultants:   None.   Subjective:    [name] [name] tolerating her diet she spiked a fever.   Objective:    Vitals:   01/17/22 0317 01/17/22 0330 01/17/22 0725 01/17/22 0847  BP: (!) 125/106 98/62 (!) 159/69 (!) 144/56  Pulse: (!) 101  (!) 104 78  Resp: '20 20 20 20  '$ Temp: 99.4 F (37.4 C)   (!) 101.7 F (38.7 C) 100.3 F (37.9 C)  TempSrc: Axillary  Oral Oral  SpO2: 98%  100% 96%  Weight:      Height:       SpO2: 96 %   Intake/Output Summary (Last 24 hours) at 01/17/2022 0902 Last data filed at 01/17/2022 0700 Gross per 24 hour  Intake 2409.91 ml  Output 2103 ml  Net 306.91 ml    Filed Weights   01/14/22 0500 01/15/22 0500 01/16/22 0323  Weight: 53.4 kg 54 kg 60 kg    Exam: General exam: In [name] acute distress. Respiratory system: Good air movement and clear to auscultation. Cardiovascular system: S1 & S2 heard, RRR. [name] JVD. Gastrointestinal system: Abdomen is nondistended, soft and nontender.  Extremities: [name] pedal edema. Skin: [name] rashes, lesions or ulcers Psychiatry: Judgement and insight appear normal. Mood & affect appropriate.   Data Reviewed:    Labs: Basic Metabolic Panel: Recent Labs  Lab 01/13/22 1208 01/15/22 0440 01/16/22 0453  NA 136  --  133*  K 4.1  --  3.8  CL 99  --  100  CO2 24  --  25  GLUCOSE 312*  --  266*  BUN 26*  --  13  CREATININE 0.86 0.72 0.72  CALCIUM 9.9  --  9.5    GFR Estimated Creatinine Clearance: 48.7 mL/min (by C-G formula based on SCr of 0.72 mg/dL). Liver Function Tests: Recent Labs  Lab 01/13/22 1208 01/16/22 0453  AST 23 34  ALT 27 29  ALKPHOS 42 44  BILITOT 1.0 0.8  PROT 6.1* 5.4*  ALBUMIN 3.2* 2.5*    [name] results for input(s): "LIPASE", "AMYLASE" in the last 168 hours. [name] results for input(s): "AMMONIA" in the last 168 hours. Coagulation profile [name] results for input(s): "INR", "PROTIME" in the last 168 hours. COVID-19 Labs  [name] results for input(s): "DDIMER", "FERRITIN", "LDH", "CRP" in the last 72 hours.  Lab Results  Component Value Date   SARSCOV2NAA NEGATIVE 01/13/2022   SARSCOV2NAA NEGATIVE 11/21/2021   SARSCOV2NAA POSITIVE (A) 09/14/2021   SARSCOV2NAA POSITIVE (A) 09/13/2021    CBC: Recent Labs  Lab 01/13/22 1208 01/16/22 0453  WBC 11.4* 9.7  NEUTROABS 9.2*  --   HGB 10.6*  9.7*  HCT 32.2* 29.1*  MCV 102.5* 99.7  PLT 209 225    Cardiac Enzymes: [name] results for input(s): "CKTOTAL", "CKMB", "CKMBINDEX", "TROPONINI" in the last 168 hours. BNP (last 3 results) [name] results for input(s): "PROBNP" in the last 8760 hours. CBG: Recent Labs  Lab 01/16/22 1158 01/16/22 1559 01/16/22 2008 01/17/22 0313 01/17/22 0711  GLUCAP 140* 103* 132* 182* 185*    D-Dimer: [name] results for input(s): "DDIMER" in the last 72 hours. Hgb A1c: [name] results for input(s): "HGBA1C" in the last 72 hours.  Lipid Profile: [name] results for input(s): "CHOL", "HDL", "LDLCALC", "TRIG", "CHOLHDL", "LDLDIRECT" in the last 72 hours. Thyroid function studies: [name] results for input(s): "TSH", "T4TOTAL", "T3FREE", "THYROIDAB" in the last 72 hours.  Invalid input(s): "FREET3" Anemia work up: [name] results for input(s): "VITAMINB12", "FOLATE", "FERRITIN", "TIBC", "IRON", "RETICCTPCT" in the last 72 hours. Sepsis Labs: Recent Labs  Lab 01/13/22 1208 01/16/22 0453  WBC 11.4* 9.7  LATICACIDVEN 1.7  --     Microbiology Recent Results (from the past 240 hour(s))  Urine Culture     Status: Abnormal   Collection Time: 01/13/22  1:10 PM   Specimen: Urine, Clean Catch  Result Value Ref Range Status   Specimen Description   Final    URINE, CLEAN CATCH Performed at [facility], [place] [address], [place], [place] [address]    Special Requests   Final    URINE CUP ONLY Performed at [facility], [place] [address], [place], [place] [address]    Culture (A)  Final    <10,000 COLONIES/mL INSIGNIFICANT GROWTH Performed at [place] [address], [place], [place] [address]    Report Status 01/15/2022 FINAL  Final  Resp Panel by RT-PCR (Flu A&B, Covid) Anterior Nasal Swab     Status: None   Collection Time: 01/13/22  1:28 PM   Specimen: Anterior Nasal Swab  Result Value Ref Range Status   SARS Coronavirus 2 by RT PCR NEGATIVE NEGATIVE Final    Comment:  (NOTE) SARS-CoV-2 target nucleic acids are NOT DETECTED.  The SARS-CoV-2 RNA is generally detectable in upper respiratory specimens during the acute phase of infection. The lowest concentration of SARS-CoV-2 viral copies this assay can detect is 138 copies/mL. A negative result does not preclude SARS-Cov-2 infection and should not be used as the sole basis for treatment or other patient management decisions. A negative result may occur with  improper specimen collection/handling, submission of  specimen other than nasopharyngeal swab, presence of viral mutation(s) within the areas targeted by this assay, and inadequate number of viral copies(<138 copies/mL). A negative result must be combined with clinical observations, patient history, and epidemiological information. The expected result is Negative.  Fact Sheet for Patients:  EntrepreneurPulse.com.au  Fact Sheet for Healthcare Providers:  IncredibleEmployment.be  This test is [name] t yet approved or cleared by the [place] FDA and  has been authorized for detection and/or diagnosis of SARS-CoV-2 by FDA under an Emergency Use Authorization (EUA). This EUA will remain  in effect (meaning this test can be used) for the duration of the COVID-19 declaration under Section 564(b)(1) of the Act, 21 U.S.C.section 360bbb-3(b)(1), unless the authorization is terminated  or revoked sooner.       Influenza A by PCR NEGATIVE NEGATIVE Final   Influenza B by PCR NEGATIVE NEGATIVE Final    Comment: (NOTE) The Xpert Xpress SARS-CoV-2/FLU/RSV plus assay is intended as an aid in the diagnosis of influenza from Nasopharyngeal swab specimens and should not be used as a sole basis for treatment. Nasal washings and aspirates are unacceptable for Xpert Xpress SARS-CoV-2/FLU/RSV testing.  Fact Sheet for Patients: EntrepreneurPulse.com.au  Fact Sheet for Healthcare  Providers: IncredibleEmployment.be  This test is not yet approved or cleared by the [place] FDA and has been authorized for detection and/or diagnosis of SARS-CoV-2 by FDA under an Emergency Use Authorization (EUA). This EUA will remain in effect (meaning this test can be used) for the duration of the COVID-19 declaration under Section 564(b)(1) of the Act, 21 U.S.C. section 360bbb-3(b)(1), unless the authorization is terminated or revoked.  Performed at [facility], [place] [address] [place], [place] [address]   Culture, blood (routine x 2)     Status: None (Preliminary result)   Collection Time: 01/13/22  2:14 PM   Specimen: BLOOD  Result Value Ref Range Status   Specimen Description   Final    BLOOD BLOOD RIGHT ARM UPPER ARM Performed at [facility] [address], [place], [place] [address]    Special Requests   Final    BOTTLES DRAWN AEROBIC AND ANAEROBIC Blood Culture adequate volume Performed at [facility] [address] [place], [place] [address]    Culture   Final    [name] GROWTH 3 DAYS Performed at [facility], [place] [address], [place], [place] [address]    Report Status PENDING  Incomplete  Culture, blood (routine x 2)     Status: None (Preliminary result)   Collection Time: 01/13/22  2:20 PM   Specimen: BLOOD LEFT WRIST  Result Value Ref Range Status   Specimen Description   Final    BLOOD LEFT WRIST Performed at [facility] [address], [place], [place] [address]    Special Requests   Final    BOTTLES DRAWN AEROBIC AND ANAEROBIC Blood Culture adequate volume Performed at [facility] [address] [place], [place] [address]    Culture   Final    [name] GROWTH 3 DAYS Performed at [facility], [place] [address], [place], [place] [address]    Report Status PENDING  Incomplete     Medications:    amLODipine  2.5 mg Oral  Daily   docusate sodium  100 mg Oral BID   famotidine  10 mg Oral QHS   feeding supplement (GLUCERNA SHAKE)  237 mL Oral TID BM   fluconazole  100 mg Oral Daily   folic acid  1 mg Oral QPM   insulin aspart  0-5 Units Subcutaneous QHS   insulin aspart  0-9 Units Subcutaneous TID WC   insulin aspart  4 Units Subcutaneous TID WC   insulin detemir  4 Units Subcutaneous BID   levETIRAcetam  500 mg Oral BID   levothyroxine  200 mcg Oral QAC breakfast   lidocaine  1 patch Transdermal Q24H   losartan  50 mg Oral Daily   metoprolol tartrate  25 mg Oral BID   omega-3 acid ethyl esters  1 g Oral BID   predniSONE  12 mg Oral Q breakfast   sodium chloride flush  3 mL Intravenous Q12H   Continuous Infusions:  sodium chloride 100 mL/hr at 01/17/22 0844   ampicillin-sulbactam (UNASYN) IV 1.5 g (01/17/22 0457)      LOS: 1 day   Charlynne Cousins  [facility]  01/17/2022, 9:02 AM

## 2022-01-17 NOTE — Progress Notes (Signed)
I was called to patient's room by her daughter stated the patient was having severe tremor. I went in and witnessed the tremors. Patient was Alert and following command. RRT was called and she's at the bedside right now.  Dr. Myna Hidalgo was notified and he ordered Ativan 0.25 mg PRN once. Family refused the ativan stated it will mask what's going on with the patient. Will continue to monitor.

## 2022-01-18 DIAGNOSIS — R778 Other specified abnormalities of plasma proteins: Secondary | ICD-10-CM | POA: Diagnosis not present

## 2022-01-18 DIAGNOSIS — R509 Fever, unspecified: Secondary | ICD-10-CM | POA: Diagnosis not present

## 2022-01-18 DIAGNOSIS — R55 Syncope and collapse: Secondary | ICD-10-CM | POA: Diagnosis not present

## 2022-01-18 DIAGNOSIS — I10 Essential (primary) hypertension: Secondary | ICD-10-CM | POA: Diagnosis not present

## 2022-01-18 LAB — CULTURE, BLOOD (ROUTINE X 2)
Culture: NO GROWTH
Culture: NO GROWTH
Special Requests: ADEQUATE
Special Requests: ADEQUATE

## 2022-01-18 LAB — GLUCOSE, CAPILLARY
Glucose-Capillary: 156 mg/dL — ABNORMAL HIGH (ref 70–99)
Glucose-Capillary: 143 mg/dL — ABNORMAL HIGH (ref 70–99)
Glucose-Capillary: 220 mg/dL — ABNORMAL HIGH (ref 70–99)
Glucose-Capillary: 147 mg/dL — ABNORMAL HIGH (ref 70–99)
Glucose-Capillary: 113 mg/dL — ABNORMAL HIGH (ref 70–99)

## 2022-01-18 MED ORDER — FLUCONAZOLE IN SODIUM CHLORIDE 200-0.9 MG/100ML-% IV SOLN
200.0000 mg | INTRAVENOUS | Status: DC
  Administered 2022-01-18 – 2022-01-20 (×3): 200 mg via INTRAVENOUS
  Filled 2022-01-18 (×3): qty 100

## 2022-01-18 MED ORDER — ZINC OXIDE 40 % EX OINT: TOPICAL_OINTMENT | Freq: Two times a day (BID) | CUTANEOUS | Status: DC | PRN

## 2022-01-18 NOTE — Progress Notes (Signed)
Patient up to chair with 2 max assist, no tremors noted.

## 2022-01-18 NOTE — Progress Notes (Signed)
[facility] PROGRESS NOTE    Progress Note  [name] [name]  NLG:[id number] DOB: [birth year] DOA: 01/13/2022 PCP: Lajean Manes, MD     Brief Narrative:   [name] [name] is an 88 y.o. female past medical history significant for essential hypertension, diabetes mellitus type 2 peripheral arterial disease, dermatomyositis comes in after a syncopal episode.  According to the daughter she was being assisted from the bathroom to the wheelchair, when she slumped over in the wheelchair and briefly lost consciousness there was no fall.  Here that she had a similar episode 4 days prior to admission.  Assessment/Plan:   Aspiration pneumonia: She is on chronic steroids which is being tapered as an outpatient. Continue IV vancomycin and Unasyn, chest x-ray showed new infiltrate. She has defervesced.  Syncope: Cardiology interrogated the pacer showed bradycardia no atrial fibrillation Extensive work-up was negative. Physical therapy evaluated the patient and recommended skilled nursing facility.  Oral thrush: Continues to have significant thrush we will increase IV Diflucan.  Reevaluate tomorrow.  Sinus tach: Continue metoprolol sinus tach improved.  Dermatomyositis: Methotrexate was held, steroids were continued as she has been on them chronically. She is currently on prednisone 15.  Diabetes mellitus type 2 uncontrolled with hyperglycemia: A1c of 8.9, long-acting insulin plus sliding scale great control of her blood glucose.  Macrocytic anemia: Hemoglobin has been remaining stable, currently 10.6.  Hypothyroidism, acquired Continue current home regimen.  Essential hypertension: Home regimen was continued, her blood pressure slightly elevated   GERD: Continue current home regimen.  Elevated troponins: Denies chest pain, twelve-lead EKG showed no signs of ischemia. Cardiac biomarkers basically remained flat.  Left buttock ulcer present on admission RN Pressure Injury  Documentation: Pressure Injury 12/08/16 Stage II -  Partial thickness loss of dermis presenting as a shallow open ulcer with a red, pink wound bed without slough. (Active)  12/08/16 0810  Location: Buttocks  Location Orientation: Left  Staging: Stage II -  Partial thickness loss of dermis presenting as a shallow open ulcer with a red, pink wound bed without slough.  Wound Description (Comments):   Present on Admission: No    Estimated body mass index is 20.68 kg/m as calculated from the following:   Height as of this encounter: '5\' 7"'$  (1.702 m).   Weight as of this encounter: 59.9 kg.   DVT prophylaxis: lovenox Family Communication: Daughters Status is: Observation The patient remains OBS appropriate and will d/c before 2 midnights.    Code Status:     Code Status Orders  (From admission, onward)           Start     Ordered   01/13/22 1557  Do not attempt resuscitation (DNR)  Continuous       Question Answer Comment  In the event of cardiac or respiratory ARREST Do not call a "code blue"   In the event of cardiac or respiratory ARREST Do not perform Intubation, CPR, defibrillation or ACLS   In the event of cardiac or respiratory ARREST Use medication by any route, position, wound care, and other measures to relive pain and suffering. May use oxygen, suction and manual treatment of airway obstruction as needed for comfort.      01/13/22 1556           Code Status History     Date Active Date Inactive Code Status Order ID Comments User Context   09/13/2021 1921 09/16/2021 1758 DNR [id number]  Lavina Hamman, MD ED   09/13/2021 1901  09/13/2021 1921 Full Code [id number]  Lavina Hamman, MD ED   10/08/2019 1040 10/12/2019 1639 Full Code [id number]  Vonzella Nipple, NP ED   08/21/2019 1118 08/23/2019 2332 Full Code [id number]  Norval Morton, MD ED   08/18/2019 0602 08/20/2019 1748 Full Code [id number]  Shela Leff, MD Inpatient   11/26/2016 0048 12/09/2016 1943 Full Code  [id number]  Edwin Dada, MD Inpatient   06/10/2016 1355 06/11/2016 1537 Full Code [id number]  Elease Hashimoto ED      Advance Directive Documentation    Flowsheet Row Most Recent Value  Type of Advance Directive Healthcare Power of Attorney  Pre-existing out of facility DNR order (yellow form or pink MOST form) --  "MOST" Form in Place? --         IV Access:   Peripheral IV   Procedures and diagnostic studies:   DG Chest 1 View  Result Date: 01/16/2022 CLINICAL DATA:  Provided history: Encounter for fever. EXAM: CHEST  1 VIEW COMPARISON:  Chest radiographs 01/13/2022 and earlier. FINDINGS: Loop recorder device. A small object overlies the right lung base, likely overlying the patient. Heart size within normal limits. Aortic atherosclerosis. Ill-defined opacities within the left greater than right lung bases which may reflect atelectasis and/or pneumonia. Small left pleural effusion. No acute bony abnormality identified. IMPRESSION: Ill-defined opacities within the left greater than right lung bases, new from the prior chest radiograph of 01/13/2022. Findings may reflect atelectasis and/or pneumonia. Small left pleural effusion, also new from the prior exam. Aortic Atherosclerosis (ICD10-I70.0). Electronically Signed   By: Kellie Simmering D.O.   On: 01/16/2022 11:27     Medical Consultants:   None.   Subjective:    [name] [name] tolerating her diet.   Objective:    Vitals:   01/17/22 1621 01/17/22 2019 01/18/22 0500 01/18/22 0510  BP: 139/62 (!) 125/53  (!) 148/59  Pulse: 67 66  70  Resp: (!) '21 18  18  '$ Temp: 97.9 F (36.6 C) 97.8 F (36.6 C)  99.1 F (37.3 C)  TempSrc: Oral Oral  Oral  SpO2: 97% 95%  94%  Weight:   59.9 kg   Height:       SpO2: 94 %   Intake/Output Summary (Last 24 hours) at 01/18/2022 0828 Last data filed at 01/18/2022 0511 Gross per 24 hour  Intake 1666.31 ml  Output 1725 ml  Net -58.69 ml    Filed Weights   01/15/22  0500 01/16/22 0323 01/18/22 0500  Weight: 54 kg 60 kg 59.9 kg    Exam: General exam: In no acute distress. Respiratory system: Good air movement and clear to auscultation. Cardiovascular system: S1 & S2 heard, RRR. No JVD. Gastrointestinal system: Abdomen is nondistended, soft and nontender.  Extremities: No pedal edema. Skin: No rashes, lesions or ulcers Psychiatry: Judgement and insight appear normal. Mood & affect appropriate.  Data Reviewed:    Labs: Basic Metabolic Panel: Recent Labs  Lab 01/13/22 1208 01/15/22 0440 01/16/22 0453  NA 136  --  133*  K 4.1  --  3.8  CL 99  --  100  CO2 24  --  25  GLUCOSE 312*  --  266*  BUN 26*  --  13  CREATININE 0.86 0.72 0.72  CALCIUM 9.9  --  9.5    GFR Estimated Creatinine Clearance: 48.6 mL/min (by C-G formula based on SCr of 0.72 mg/dL). Liver Function Tests: Recent Labs  Lab 01/13/22 1208 01/16/22 1275  AST 23 34  ALT 27 29  ALKPHOS 42 44  BILITOT 1.0 0.8  PROT 6.1* 5.4*  ALBUMIN 3.2* 2.5*    No results for input(s): "LIPASE", "AMYLASE" in the last 168 hours. No results for input(s): "AMMONIA" in the last 168 hours. Coagulation profile No results for input(s): "INR", "PROTIME" in the last 168 hours. COVID-19 Labs  No results for input(s): "DDIMER", "FERRITIN", "LDH", "CRP" in the last 72 hours.  Lab Results  Component Value Date   SARSCOV2NAA NEGATIVE 01/13/2022   SARSCOV2NAA NEGATIVE 11/21/2021   SARSCOV2NAA POSITIVE (A) 09/14/2021   SARSCOV2NAA POSITIVE (A) 09/13/2021    CBC: Recent Labs  Lab 01/13/22 1208 01/16/22 0453  WBC 11.4* 9.7  NEUTROABS 9.2*  --   HGB 10.6* 9.7*  HCT 32.2* 29.1*  MCV 102.5* 99.7  PLT 209 225    Cardiac Enzymes: No results for input(s): "CKTOTAL", "CKMB", "CKMBINDEX", "TROPONINI" in the last 168 hours. BNP (last 3 results) No results for input(s): "PROBNP" in the last 8760 hours. CBG: Recent Labs  Lab 01/17/22 1111 01/17/22 1617 01/17/22 2022 01/18/22 0512  01/18/22 0725  GLUCAP 124* 137* 113* 156* 143*    D-Dimer: No results for input(s): "DDIMER" in the last 72 hours. Hgb A1c: No results for input(s): "HGBA1C" in the last 72 hours.  Lipid Profile: No results for input(s): "CHOL", "HDL", "LDLCALC", "TRIG", "CHOLHDL", "LDLDIRECT" in the last 72 hours. Thyroid function studies: No results for input(s): "TSH", "T4TOTAL", "T3FREE", "THYROIDAB" in the last 72 hours.  Invalid input(s): "FREET3" Anemia work up: No results for input(s): "VITAMINB12", "FOLATE", "FERRITIN", "TIBC", "IRON", "RETICCTPCT" in the last 72 hours. Sepsis Labs: Recent Labs  Lab 01/13/22 1208 01/16/22 0453  WBC 11.4* 9.7  LATICACIDVEN 1.7  --     Microbiology Recent Results (from the past 240 hour(s))  Urine Culture     Status: Abnormal   Collection Time: 01/13/22  1:10 PM   Specimen: Urine, Clean Catch  Result Value Ref Range Status   Specimen Description   Final    URINE, CLEAN CATCH Performed at [facility], [place] [address] [place], [place] [address]    Special Requests   Final    URINE CUP ONLY Performed at [facility], [place] [address] [place], [place] [address]    Culture (A)  Final    <10,000 COLONIES/mL INSIGNIFICANT GROWTH Performed at [place] [address], [place], [place] [address]    Report Status 01/15/2022 FINAL  Final  Resp Panel by RT-PCR (Flu A&B, Covid) Anterior Nasal Swab     Status: None   Collection Time: 01/13/22  1:28 PM   Specimen: Anterior Nasal Swab  Result Value Ref Range Status   SARS Coronavirus 2 by RT PCR NEGATIVE NEGATIVE Final    Comment: (NOTE) SARS-CoV-2 target nucleic acids are NOT DETECTED.  The SARS-CoV-2 RNA is generally detectable in upper respiratory specimens during the acute phase of infection. The lowest concentration of SARS-CoV-2 viral copies this assay can detect is 138 copies/mL. A negative result does not preclude SARS-Cov-2 infection and should  not be used as the sole basis for treatment or other patient management decisions. A negative result may occur with  improper specimen collection/handling, submission of specimen other than nasopharyngeal swab, presence of viral mutation(s) within the areas targeted by this assay, and inadequate number of viral copies(<138 copies/mL). A negative result must be combined with clinical observations, patient history, and epidemiological information. The expected result is Negative.  Fact Sheet for Patients:  EntrepreneurPulse.com.au  Fact Sheet for Healthcare Providers:  IncredibleEmployment.be  This test is no t yet approved or cleared by the [place] FDA and  has been authorized for detection and/or diagnosis of SARS-CoV-2 by FDA under an Emergency Use Authorization (EUA). This EUA will remain  in effect (meaning this test can be used) for the duration of the COVID-19 declaration under Section 564(b)(1) of the Act, 21 U.S.C.section 360bbb-3(b)(1), unless the authorization is terminated  or revoked sooner.       Influenza A by PCR NEGATIVE NEGATIVE Final   Influenza B by PCR NEGATIVE NEGATIVE Final    Comment: (NOTE) The Xpert Xpress SARS-CoV-2/FLU/RSV plus assay is intended as an aid in the diagnosis of influenza from Nasopharyngeal swab specimens and should not be used as a sole basis for treatment. Nasal washings and aspirates are unacceptable for Xpert Xpress SARS-CoV-2/FLU/RSV testing.  Fact Sheet for Patients: EntrepreneurPulse.com.au  Fact Sheet for Healthcare Providers: IncredibleEmployment.be  This test is not yet approved or cleared by the [place] FDA and has been authorized for detection and/or diagnosis of SARS-CoV-2 by FDA under an Emergency Use Authorization (EUA). This EUA will remain in effect (meaning this test can be used) for the duration of the COVID-19 declaration under  Section 564(b)(1) of the Act, 21 U.S.C. section 360bbb-3(b)(1), unless the authorization is terminated or revoked.  Performed at [facility], [place] [address] [place], [place] [address]   Culture, blood (routine x 2)     Status: None   Collection Time: 01/13/22  2:14 PM   Specimen: BLOOD  Result Value Ref Range Status   Specimen Description   Final    BLOOD BLOOD RIGHT ARM UPPER ARM Performed at [place] [address] [place], [place] [address]    Special Requests   Final    BOTTLES DRAWN AEROBIC AND ANAEROBIC Blood Culture adequate volume Performed at [place] [address], [place], [place] [address]    Culture   Final    NO GROWTH 5 DAYS Performed at [facility], [place] [address], [place], [place] [address]    Report Status 01/18/2022 FINAL  Final  Culture, blood (routine x 2)     Status: None   Collection Time: 01/13/22  2:20 PM   Specimen: BLOOD LEFT WRIST  Result Value Ref Range Status   Specimen Description   Final    BLOOD LEFT WRIST Performed at [place] [address] [place], [place] [address]    Special Requests   Final    BOTTLES DRAWN AEROBIC AND ANAEROBIC Blood Culture adequate volume Performed at [place] [address] [place], [place] [address]    Culture   Final    NO GROWTH 5 DAYS Performed at [facility], [place] [address], [place], [place] [address]    Report Status 01/18/2022 FINAL  Final  Urine Culture     Status: None   Collection Time: 01/16/22 10:52 AM   Specimen: Urine, Clean Catch  Result Value Ref Range Status   Specimen Description   Final    URINE, CLEAN CATCH Performed at [facility], [place] [address] [place], [place] [address]    Special Requests   Final    NONE Performed at [facility], [place] [address], [place],  [address]    Culture   Final    NO  GROWTH Performed  at [facility], [place] [address] [employer] [place], [place] [address]    Report Status 01/17/2022 FINAL  Final  Culture, blood (Routine X 2) w Reflex to ID Panel     Status: None (Preliminary result)   Collection Time: 01/16/22 11:33 AM   Specimen: BLOOD  Result Value Ref Range Status   Specimen Description   Final    BLOOD LEFT ANTECUBITAL Performed at [place] [address] [place], [place] [address]    Special Requests   Final    BOTTLES DRAWN AEROBIC ONLY Blood Culture adequate volume Performed at [place] [address] [place], [place] [address]    Culture   Final    NO GROWTH 2 DAYS Performed at [place] [address] [place], [place] [address]    Report Status PENDING  Incomplete  Culture, blood (Routine X 2) w Reflex to ID Panel     Status: None (Preliminary result)   Collection Time: 01/16/22 11:33 AM   Specimen: BLOOD LEFT FOREARM  Result Value Ref Range Status   Specimen Description   Final    BLOOD LEFT FOREARM Performed at [place] [address], [place], [place] [address]    Special Requests   Final    BOTTLES DRAWN AEROBIC ONLY Blood Culture adequate volume Performed at [place] [address] [place], [place] [address]    Culture   Final    NO GROWTH 2 DAYS Performed at [place] [address], [place],  [address]    Report Status PENDING  Incomplete     Medications:    amLODipine  5 mg Oral Daily   docusate sodium  100 mg Oral BID   famotidine  10 mg Oral QHS   feeding supplement (GLUCERNA SHAKE)  237 mL Oral TID BM   fluconazole  100 mg Oral Daily   folic acid  1 mg Oral QPM   insulin aspart  0-5 Units Subcutaneous QHS   insulin aspart  0-9 Units Subcutaneous TID WC   insulin aspart  4 Units Subcutaneous TID WC   insulin detemir  4 Units Subcutaneous BID   levETIRAcetam  500 mg Oral BID   levothyroxine   200 mcg Oral QAC breakfast   lidocaine  1 patch Transdermal Q24H   losartan  50 mg Oral Daily   metoprolol tartrate  25 mg Oral BID   omega-3 acid ethyl esters  1 g Oral BID   predniSONE  12 mg Oral Q breakfast   sodium chloride flush  3 mL Intravenous Q12H   Continuous Infusions:  sodium chloride Stopped (01/18/22 0511)   ampicillin-sulbactam (UNASYN) IV 3 g (01/18/22 0511)   vancomycin Stopped (01/17/22 2306)      LOS: 2 days   Charlynne Cousins  [facility]  01/18/2022, 8:28 AM

## 2022-01-19 DIAGNOSIS — M3390 Dermatopolymyositis, unspecified, organ involvement unspecified: Secondary | ICD-10-CM | POA: Diagnosis not present

## 2022-01-19 DIAGNOSIS — I10 Essential (primary) hypertension: Secondary | ICD-10-CM | POA: Diagnosis not present

## 2022-01-19 DIAGNOSIS — E119 Type 2 diabetes mellitus without complications: Secondary | ICD-10-CM

## 2022-01-19 DIAGNOSIS — R778 Other specified abnormalities of plasma proteins: Secondary | ICD-10-CM | POA: Diagnosis not present

## 2022-01-19 DIAGNOSIS — B370 Candidal stomatitis: Secondary | ICD-10-CM

## 2022-01-19 DIAGNOSIS — Z794 Long term (current) use of insulin: Secondary | ICD-10-CM

## 2022-01-19 DIAGNOSIS — R55 Syncope and collapse: Secondary | ICD-10-CM | POA: Diagnosis not present

## 2022-01-19 DIAGNOSIS — J690 Pneumonitis due to inhalation of food and vomit: Secondary | ICD-10-CM

## 2022-01-19 DIAGNOSIS — J189 Pneumonia, unspecified organism: Secondary | ICD-10-CM

## 2022-01-19 LAB — GLUCOSE, CAPILLARY
Glucose-Capillary: 149 mg/dL — ABNORMAL HIGH (ref 70–99)
Glucose-Capillary: 164 mg/dL — ABNORMAL HIGH (ref 70–99)
Glucose-Capillary: 202 mg/dL — ABNORMAL HIGH (ref 70–99)
Glucose-Capillary: 135 mg/dL — ABNORMAL HIGH (ref 70–99)
Glucose-Capillary: 75 mg/dL (ref 70–99)

## 2022-01-19 LAB — CREATININE, SERUM
Creatinine, Ser: 0.6 mg/dL (ref 0.44–1.00)
GFR, Estimated: >60 mL/min (ref >60)

## 2022-01-19 MED ORDER — NYSTATIN 100000 UNIT/ML MT SUSP
5.0000 mL | Freq: Four times a day (QID) | OROMUCOSAL | Status: DC
  Administered 2022-01-19 – 2022-01-20 (×5): 500000 [IU] via ORAL
  Filled 2022-01-19 (×5): qty 5

## 2022-01-19 MED ORDER — INSULIN DETEMIR 100 UNIT/ML ~~LOC~~ SOLN
6.0000 [IU] | Freq: Two times a day (BID) | SUBCUTANEOUS | Status: DC
Start: 2022-01-19 — End: 2022-01-20
  Administered 2022-01-19 – 2022-01-20 (×2): 6 [IU] via SUBCUTANEOUS
  Filled 2022-01-19 (×4): qty 0.06

## 2022-01-19 NOTE — TOC Transition Note (Signed)
Transition of Care [facility]) - CM/SW Discharge Note   Patient Details  Name: [name] [name] MRN: [id number] Date of Birth: 10-Jul-1934  [facility] CM/SW Contact:  Joaquin Courts, RN Phone Number: 01/19/2022, 11:13 AM   Clinical Narrative:    CM spoke with Daughter regarding updated PT recommendations for SNF.  Daughter declines SNF, reports would like patient to return to home with HHPT/OT services, previously initiated with [facility].  CM spoke with Agency rep Montel Clock will accept patient for services following discharge.  MD notified of family choice, CM requested orders for HHPT/OT.    Daughter reports patient has all needed equipment in the home, including rolling walker, wheelchair, bedside commode, walk in shower with grab bars.    No further TOC needs identified at this time.   Final next level of care: [place] Barriers to Discharge: Continued Medical Work up   Patient Goals and CMS Choice Patient states their goals for this hospitalization and ongoing recovery are:: Home CMS Medicare.gov Compare Post Acute Care list provided to:: Patient Represenative (must comment) Edwin Cap dtr [phone]) Choice offered to / list presented to : Adult Children  Discharge Placement                       Discharge Plan and Services   Discharge Planning Services: CM Consult Post Acute Care Choice: Home Health                      [facility] Agency: [place] Date [place]: 01/19/22 Time [place]: [id number] Representative spoke with at [place]: [place] ([place]) Interventions     Readmission Risk Interventions    01/19/2022   11:11 AM  Readmission Risk Prevention Plan  Transportation Screening Complete  PCP or Specialist Appt within 3-5 Days Complete  HRI or [place] Complete  Social Work Consult for [place] Planning/Counseling Complete  Palliative Care  Screening Not Applicable  Medication Review Press photographer) Complete

## 2022-01-19 NOTE — Care Management Important Message (Signed)
Important Message  Patient Details IM Letter placed in Patients room for Daughter Name: [name] [name] MRN: [id number] Date of Birth: [birth year]   Medicare Important Message Given:  Yes     Kerin Salen 01/19/2022, 2:46 PM

## 2022-01-19 NOTE — Progress Notes (Signed)
Physical Therapy Treatment Patient Details Name: [name] [name] MRN: [id number] DOB: 1933-10-17 Today's Date: 01/19/2022   History of Present Illness Patient is 88 y.o. female who presented to ED from home on 6/27 from fainting while moving from bathroom to [facility] with daughter assisting. Daughter reports similar occurence 4 days prior. PMH significant for CVA, HTN, HLD, type II DM, PAD, hypothyroidism, recent diagnosis of dermatomyositis recently tappered off steroids.    PT Comments    Pt participated in therapy, however was unable to complete exercises due to cognitive deficits. Currently pt requires Max / Total assist for bed mobility, transfers, and gait. Daughter present and tried to motivate pt. Pt requires AD Charlaine Dalton) for majority mobility. Will continue to progress in acute setting.    Recommendations for follow up therapy are one component of a multi-disciplinary discharge planning process, led by the attending physician.  Recommendations may be updated based on patient status, additional functional criteria and insurance authorization.  Follow Up Recommendations  Home health PT Can patient physically be transported by private vehicle: No   Assistance Recommended at Discharge Frequent or constant Supervision/Assistance  Patient can return home with the following A lot of help with walking and/or transfers;A lot of help with bathing/dressing/bathroom;Assistance with cooking/housework;Assistance with feeding;Assist for transportation;Help with stairs or ramp for entrance;Direct supervision/assist for medications management;Direct supervision/assist for financial management   Equipment Recommendations  None recommended by PT    Recommendations for Other Services       Precautions / Restrictions Precautions Precautions: Fall Restrictions Weight Bearing Restrictions: No     Mobility  Bed Mobility               General bed mobility comments: Pt reported she did not want to  move to bed.    Transfers                   General transfer comment: No transfers were adminstrated, however pt needs stedy for transfers.    Ambulation/Gait                   Stairs             Wheelchair Mobility    Modified Rankin (Stroke Patients Only)       Balance Overall balance assessment: Needs assistance Sitting-balance support: Feet supported, Bilateral upper extremity supported Sitting balance-Leahy Scale: Poor Sitting balance - Comments: Pt demonstrated forward lean, required VC's to reposition head back in neutral position.                                    Cognition Arousal/Alertness: Awake/alert Behavior During Therapy: Agitated Overall Cognitive Status: Within Functional Limits for tasks assessed Area of Impairment: Following commands, Attention, Awareness                 Orientation Level: Situation Current Attention Level: Selective   Following Commands: Follows one step commands with increased time   Awareness: Intellectual Problem Solving: Slow processing, Requires verbal cues, Requires tactile cues, Decreased initiation General Comments: Pt demonstrated increased time with following commands, pt demonstrated comprehension of instructions but did not complete exercises.        Exercises General Exercises - Lower Extremity Long Arc Quad: 10 reps, AROM, Strengthening, Both Hip Flexion/Marching: AROM, Strengthening, Right, 5 reps, Seated Other Exercises Other Exercises: 5 reps Bil UE hand towel grab contralaterally and ipsilaterally.    General Comments  Pertinent Vitals/Pain      Home Living                          Prior Function            PT Goals (current goals can now be found in the care plan section) Acute Rehab PT Goals Patient Stated Goal: To go home PT Goal Formulation: With patient/family Time For Goal Achievement: 01/28/22 Potential to Achieve Goals:  Fair Progress towards PT goals: Not progressing toward goals - comment (Pt unable to complete exercises.)    Frequency    Min 3X/week      PT Plan Current plan remains appropriate    Co-evaluation              AM-PAC PT "6 Clicks" Mobility   Outcome Measure  Help needed turning from your back to your side while in a flat bed without using bedrails?: Total Help needed moving from lying on your back to sitting on the side of a flat bed without using bedrails?: Total Help needed moving to and from a bed to a chair (including a wheelchair)?: Total Help needed standing up from a chair using your arms (e.g., wheelchair or bedside chair)?: Total Help needed to walk in hospital room?: Total Help needed climbing 3-5 steps with a railing? : Total 6 Click Score: 6    End of Session   Activity Tolerance: Treatment limited secondary to agitation Patient left: in chair;with call bell/phone within reach;with family/visitor present Nurse Communication: Mobility status PT Visit Diagnosis: Unsteadiness on feet (R26.81);Other abnormalities of gait and mobility (R26.89);Muscle weakness (generalized) (M62.81);Adult, failure to thrive (R62.7)     Time: 4034-7425 PT Time Calculation (min) (ACUTE ONLY): 36 min  Charges:  $Therapeutic Exercise: 23-37 mins                     Margie Ege, SPT [place] 01/19/2022, 3:48 PM

## 2022-01-19 NOTE — Progress Notes (Signed)
[facility] PROGRESS NOTE    Progress Note  [name] [name]  ZTI:[id number] DOB: 30-Nov-1933 DOA: 01/13/2022 PCP: Lajean Manes, MD     Brief Narrative:   [name] [name] is an 88 y.o. female past medical history significant for essential hypertension, diabetes mellitus type 2 peripheral arterial disease, dermatomyositis comes in after a syncopal episode.  According to the daughter she was being assisted from the bathroom to the wheelchair, when she slumped over in the wheelchair and briefly lost consciousness there was no fall.  Here that she had a similar episode 4 days prior to admission.  Assessment/Plan:   Aspiration pneumonia: She is on chronic steroids which is being tapered as an outpatient. Continue IV vancomycin and Unasyn, chest x-ray showed new infiltrate. We will continue current regimen for an additional 24 hours can be transition to oral Augmentin on 01/20/2022.  Syncope: Cardiology interrogated the pacer showed bradycardia no atrial fibrillation Extensive work-up was negative. Physical therapy evaluated the patient and recommended skilled nursing facility.  Oral thrush: Oral thrush not improved she relates this morning still bothers her continue Diflucan we will add nystatin mouthwashes.  Sinus tach: Continue metoprolol sinus tach improved.  Dermatomyositis: Methotrexate was held, steroids were continued as she has been on them chronically. She is currently on prednisone 15.  Diabetes mellitus type 2 uncontrolled with hyperglycemia: A1c of 8.9, blood glucose is trending up after intake is improved. Increase long-acting insulin continue sliding scale.  Macrocytic anemia: Hemoglobin has been remaining stable, currently 10.6.  Hypothyroidism, acquired Continue current home regimen.  Essential hypertension: Home regimen was continued, blood pressure fairly controlled.   GERD: Continue current home regimen.  Elevated troponins: Denies chest pain,  twelve-lead EKG showed no signs of ischemia. Cardiac biomarkers basically remained flat.  Left buttock ulcer present on admission RN Pressure Injury Documentation: Pressure Injury 12/08/16 Stage II -  Partial thickness loss of dermis presenting as a shallow open ulcer with a red, pink wound bed without slough. (Active)  12/08/16 0810  Location: Buttocks  Location Orientation: Left  Staging: Stage II -  Partial thickness loss of dermis presenting as a shallow open ulcer with a red, pink wound bed without slough.  Wound Description (Comments):   Present on Admission: No    Estimated body mass index is 22.2 kg/m as calculated from the following:   Height as of this encounter: '5\' 7"'$  (1.702 m).   Weight as of this encounter: 64.3 kg.   DVT prophylaxis: lovenox Family Communication: Daughters Status is: Observation The patient remains OBS appropriate and will d/c before 2 midnights.    Code Status:     Code Status Orders  (From admission, onward)           Start     Ordered   01/13/22 1557  Do not attempt resuscitation (DNR)  Continuous       Question Answer Comment  In the event of cardiac or respiratory ARREST Do not call a "code blue"   In the event of cardiac or respiratory ARREST Do not perform Intubation, CPR, defibrillation or ACLS   In the event of cardiac or respiratory ARREST Use medication by any route, position, wound care, and other measures to relive pain and suffering. May use oxygen, suction and manual treatment of airway obstruction as needed for comfort.      01/13/22 1556           Code Status History     Date Active Date Inactive Code Status  Order ID Comments User Context   09/13/2021 1921 09/16/2021 1758 DNR [id number]  Lavina Hamman, MD ED   09/13/2021 1901 09/13/2021 1921 Full Code [id number]  Lavina Hamman, MD ED   10/08/2019 1040 10/12/2019 1639 Full Code [id number]  Vonzella Nipple, NP ED   08/21/2019 1118 08/23/2019 2332 Full Code [id number]   Norval Morton, MD ED   08/18/2019 0602 08/20/2019 1748 Full Code [id number]  Shela Leff, MD Inpatient   11/26/2016 0048 12/09/2016 1943 Full Code [id number]  Edwin Dada, MD Inpatient   06/10/2016 1355 06/11/2016 1537 Full Code [id number]  Rondel Jumbo, PA-C ED      Advance Directive Documentation    Flowsheet Row Most Recent Value  Type of Advance Directive Healthcare Power of Attorney  Pre-existing out of facility DNR order (yellow form or pink MOST form) --  "MOST" Form in Place? --         IV Access:   Peripheral IV   Procedures and diagnostic studies:   No results found.   Medical Consultants:   None.   Subjective:    [name] [name] she relates she is tolerating her diet   Objective:    Vitals:   01/19/22 0500 01/19/22 0524 01/19/22 0704 01/19/22 0800  BP:  (!) 145/61    Pulse:  65    Resp:  '18 15 17  '$ Temp:  98.9 F (37.2 C)    TempSrc:  Oral    SpO2:  95%    Weight: 64.3 kg     Height:       SpO2: 95 %   Intake/Output Summary (Last 24 hours) at 01/19/2022 0854 Last data filed at 01/19/2022 0836 Gross per 24 hour  Intake 1359.01 ml  Output 2700 ml  Net -1340.99 ml    Filed Weights   01/16/22 0323 01/18/22 0500 01/19/22 0500  Weight: 60 kg 59.9 kg 64.3 kg    Exam: General exam: In no acute distress. Respiratory system: Good air movement and clear to auscultation. Cardiovascular system: S1 & S2 heard, RRR. No JVD. Gastrointestinal system: Abdomen is nondistended, soft and nontender.  Extremities: No pedal edema. Skin: No rashes, lesions or ulcers Psychiatry: Judgement and insight appear normal. Mood & affect appropriate.  Data Reviewed:    Labs: Basic Metabolic Panel: Recent Labs  Lab 01/13/22 1208 01/15/22 0440 01/16/22 0453 01/19/22 0456  NA 136  --  133*  --   K 4.1  --  3.8  --   CL 99  --  100  --   CO2 24  --  25  --   GLUCOSE 312*  --  266*  --   BUN 26*  --  13  --   CREATININE 0.86 0.72 0.72  0.60  CALCIUM 9.9  --  9.5  --     GFR Estimated Creatinine Clearance: 50 mL/min (by C-G formula based on SCr of 0.6 mg/dL). Liver Function Tests: Recent Labs  Lab 01/13/22 1208 01/16/22 0453  AST 23 34  ALT 27 29  ALKPHOS 42 44  BILITOT 1.0 0.8  PROT 6.1* 5.4*  ALBUMIN 3.2* 2.5*    No results for input(s): "LIPASE", "AMYLASE" in the last 168 hours. No results for input(s): "AMMONIA" in the last 168 hours. Coagulation profile No results for input(s): "INR", "PROTIME" in the last 168 hours. COVID-19 Labs  No results for input(s): "DDIMER", "FERRITIN", "LDH", "CRP" in the last 72 hours.  Lab Results  Component Value Date  SARSCOV2NAA NEGATIVE 01/13/2022   SARSCOV2NAA NEGATIVE 11/21/2021   SARSCOV2NAA POSITIVE (A) 09/14/2021   SARSCOV2NAA POSITIVE (A) 09/13/2021    CBC: Recent Labs  Lab 01/13/22 1208 01/16/22 0453  WBC 11.4* 9.7  NEUTROABS 9.2*  --   HGB 10.6* 9.7*  HCT 32.2* 29.1*  MCV 102.5* 99.7  PLT 209 225    Cardiac Enzymes: No results for input(s): "CKTOTAL", "CKMB", "CKMBINDEX", "TROPONINI" in the last 168 hours. BNP (last 3 results) No results for input(s): "PROBNP" in the last 8760 hours. CBG: Recent Labs  Lab 01/18/22 1127 01/18/22 1557 01/18/22 2206 01/19/22 0526 01/19/22 0802  GLUCAP 220* 147* 113* 149* 164*    D-Dimer: No results for input(s): "DDIMER" in the last 72 hours. Hgb A1c: No results for input(s): "HGBA1C" in the last 72 hours.  Lipid Profile: No results for input(s): "CHOL", "HDL", "LDLCALC", "TRIG", "CHOLHDL", "LDLDIRECT" in the last 72 hours. Thyroid function studies: No results for input(s): "TSH", "T4TOTAL", "T3FREE", "THYROIDAB" in the last 72 hours.  Invalid input(s): "FREET3" Anemia work up: No results for input(s): "VITAMINB12", "FOLATE", "FERRITIN", "TIBC", "IRON", "RETICCTPCT" in the last 72 hours. Sepsis Labs: Recent Labs  Lab 01/13/22 1208 01/16/22 0453  WBC 11.4* 9.7  LATICACIDVEN 1.7  --      Microbiology Recent Results (from the past 240 hour(s))  Urine Culture     Status: Abnormal   Collection Time: 01/13/22  1:10 PM   Specimen: Urine, Clean Catch  Result Value Ref Range Status   Specimen Description   Final    URINE, CLEAN CATCH Performed at [facility], [place] [address] [place], [place] [address]    Special Requests   Final    URINE CUP ONLY Performed at [facility], [place] [address], [place], [place] [address]    Culture (A)  Final    <10,000 COLONIES/mL INSIGNIFICANT GROWTH Performed at [facility] [address] [place], [place] [address]    Report Status 01/15/2022 FINAL  Final  Resp Panel by RT-PCR (Flu A&B, Covid) Anterior Nasal Swab     Status: None   Collection Time: 01/13/22  1:28 PM   Specimen: Anterior Nasal Swab  Result Value Ref Range Status   SARS Coronavirus 2 by RT PCR NEGATIVE NEGATIVE Final    Comment: (NOTE) SARS-CoV-2 target nucleic acids are NOT DETECTED.  The SARS-CoV-2 RNA is generally detectable in upper respiratory specimens during the acute phase of infection. The lowest concentration of SARS-CoV-2 viral copies this assay can detect is 138 copies/mL. A negative result does not preclude SARS-Cov-2 infection and should not be used as the sole basis for treatment or other patient management decisions. A negative result may occur with  improper specimen collection/handling, submission of specimen other than nasopharyngeal swab, presence of viral mutation(s) within the areas targeted by this assay, and inadequate number of viral copies(<138 copies/mL). A negative result must be combined with clinical observations, patient history, and epidemiological information. The expected result is Negative.  Fact Sheet for Patients:  EntrepreneurPulse.com.au  Fact Sheet for Healthcare Providers:  IncredibleEmployment.be  This test is no t yet approved or  cleared by the [place] FDA and  has been authorized for detection and/or diagnosis of SARS-CoV-2 by FDA under an Emergency Use Authorization (EUA). This EUA will remain  in effect (meaning this test can be used) for the duration of the COVID-19 declaration under Section 564(b)(1) of the Act, 21 U.S.C.section 360bbb-3(b)(1), unless the authorization is terminated  or revoked  sooner.       Influenza A by PCR NEGATIVE NEGATIVE Final   Influenza B by PCR NEGATIVE NEGATIVE Final    Comment: (NOTE) The Xpert Xpress SARS-CoV-2/FLU/RSV plus assay is intended as an aid in the diagnosis of influenza from Nasopharyngeal swab specimens and should not be used as a sole basis for treatment. Nasal washings and aspirates are unacceptable for Xpert Xpress SARS-CoV-2/FLU/RSV testing.  Fact Sheet for Patients: EntrepreneurPulse.com.au  Fact Sheet for Healthcare Providers: IncredibleEmployment.be  This test is not yet approved or cleared by the [place] FDA and has been authorized for detection and/or diagnosis of SARS-CoV-2 by FDA under an Emergency Use Authorization (EUA). This EUA will remain in effect (meaning this test can be used) for the duration of the COVID-19 declaration under Section 564(b)(1) of the Act, 21 U.S.C. section 360bbb-3(b)(1), unless the authorization is terminated or revoked.  Performed at [facility], [place] [address] [place], [place] [address]   Culture, blood (routine x 2)     Status: None   Collection Time: 01/13/22  2:14 PM   Specimen: BLOOD  Result Value Ref Range Status   Specimen Description   Final    BLOOD BLOOD RIGHT ARM UPPER ARM Performed at [place] [address] [place], [place] [address]    Special Requests   Final    BOTTLES DRAWN AEROBIC AND ANAEROBIC Blood Culture adequate volume Performed at [place] [address], [place],  [place] [address]    Culture   Final    NO GROWTH 5 DAYS Performed at [facility], [place] [address], [place], [place] [address]    Report Status 01/18/2022 FINAL  Final  Culture, blood (routine x 2)     Status: None   Collection Time: 01/13/22  2:20 PM   Specimen: BLOOD LEFT WRIST  Result Value Ref Range Status   Specimen Description   Final    BLOOD LEFT WRIST Performed at [place] [address] [place], [place] [address]    Special Requests   Final    BOTTLES DRAWN AEROBIC AND ANAEROBIC Blood Culture adequate volume Performed at [place] [address], [place], [place] [address]    Culture   Final    NO GROWTH 5 DAYS Performed at [facility], [place] [address], [place], [place] [address]    Report Status 01/18/2022 FINAL  Final  Urine Culture     Status: None   Collection Time: 01/16/22 10:52 AM   Specimen: Urine, Clean Catch  Result Value Ref Range Status   Specimen Description   Final    URINE, CLEAN CATCH Performed at [facility], [place] [address] [place], [place] [address]    Special Requests   Final    NONE Performed at [facility], [place] [address] [place], [place] [address]    Culture   Final    NO GROWTH Performed at [facility], [place] [address] [place], [place] [address]    Report Status 01/17/2022 FINAL  Final  Culture, blood (Routine X 2) w Reflex to ID Panel     Status: None (Preliminary result)   Collection Time: 01/16/22 11:33 AM   Specimen: BLOOD  Result Value Ref Range Status   Specimen Description   Final    BLOOD LEFT ANTECUBITAL Performed at [facility] [address] [place], [place] [address]  Special Requests   Final    BOTTLES DRAWN AEROBIC ONLY Blood Culture adequate volume Performed at [place] [address], [place], [place] [address]    Culture   Final    NO GROWTH 2 DAYS Performed at [facility] [address], [place], [place] [address]    Report Status PENDING  Incomplete  Culture, blood (Routine X 2) w Reflex to ID Panel     Status: None (Preliminary result)   Collection Time: 01/16/22 11:33 AM   Specimen: BLOOD LEFT FOREARM  Result Value Ref Range Status   Specimen Description   Final    BLOOD LEFT FOREARM Performed at [facility] [address], [place], [place] [address]    Special Requests   Final    BOTTLES DRAWN AEROBIC ONLY Blood Culture adequate volume Performed at [place] [address], [place], [place] [address]    Culture   Final    NO GROWTH 2 DAYS Performed at [place] [address] [place], [place] [address]    Report Status PENDING  Incomplete     Medications:    amLODipine  5 mg Oral Daily   docusate sodium  100 mg Oral BID   famotidine  10 mg Oral QHS   feeding supplement (GLUCERNA SHAKE)  237 mL Oral TID BM   folic acid  1 mg Oral QPM   insulin aspart  0-5 Units Subcutaneous QHS   insulin aspart  0-9 Units Subcutaneous TID WC   insulin aspart  4 Units Subcutaneous TID WC   insulin detemir  4 Units Subcutaneous BID   levETIRAcetam  500 mg Oral BID   levothyroxine  200 mcg Oral QAC breakfast   lidocaine  1 patch Transdermal Q24H   losartan  50 mg Oral Daily   metoprolol tartrate  25 mg Oral BID   omega-3 acid ethyl esters  1 g Oral BID   predniSONE  12 mg Oral Q breakfast   sodium chloride flush  3 mL Intravenous Q12H   Continuous Infusions:  sodium chloride Stopped (01/18/22 0511)   ampicillin-sulbactam (UNASYN) IV 3 g (01/19/22 0512)   fluconazole (DIFLUCAN) IV 200 mg (01/18/22 0925)   vancomycin 1,000 mg (01/18/22 2256)      LOS: 3 days   Charlynne Cousins  [facility]  01/19/2022, 8:54 AM

## 2022-01-20 DIAGNOSIS — I10 Essential (primary) hypertension: Secondary | ICD-10-CM | POA: Diagnosis not present

## 2022-01-20 DIAGNOSIS — R778 Other specified abnormalities of plasma proteins: Secondary | ICD-10-CM | POA: Diagnosis not present

## 2022-01-20 DIAGNOSIS — R509 Fever, unspecified: Secondary | ICD-10-CM | POA: Diagnosis not present

## 2022-01-20 DIAGNOSIS — R55 Syncope and collapse: Secondary | ICD-10-CM | POA: Diagnosis not present

## 2022-01-20 DIAGNOSIS — J690 Pneumonitis due to inhalation of food and vomit: Secondary | ICD-10-CM | POA: Diagnosis not present

## 2022-01-20 DIAGNOSIS — M3390 Dermatopolymyositis, unspecified, organ involvement unspecified: Secondary | ICD-10-CM | POA: Diagnosis not present

## 2022-01-20 LAB — GLUCOSE, CAPILLARY
Glucose-Capillary: 290 mg/dL — ABNORMAL HIGH (ref 70–99)
Glucose-Capillary: 311 mg/dL — ABNORMAL HIGH (ref 70–99)
Glucose-Capillary: 222 mg/dL — ABNORMAL HIGH (ref 70–99)
Glucose-Capillary: 283 mg/dL — ABNORMAL HIGH (ref 70–99)

## 2022-01-20 MED ORDER — METHOTREXATE 2.5 MG PO TABS: 2.5000 mg | ORAL_TABLET | ORAL | 0 refills | Status: AC

## 2022-01-20 MED ORDER — METOPROLOL TARTRATE 25 MG PO TABS: 25.0000 mg | ORAL_TABLET | Freq: Two times a day (BID) | ORAL | 3 refills | Status: DC

## 2022-01-20 MED ORDER — NYSTATIN 100000 UNIT/ML MT SUSP
5.0000 mL | Freq: Four times a day (QID) | OROMUCOSAL | Status: DC
  Filled 2022-01-20: qty 5

## 2022-01-20 MED ORDER — FLUCONAZOLE 100 MG PO TABS: 200.0000 mg | ORAL_TABLET | Freq: Every day | ORAL | 0 refills | Status: DC

## 2022-01-20 MED ORDER — INSULIN ASPART 100 UNIT/ML IJ SOLN
7.0000 [IU] | Freq: Once | INTRAMUSCULAR | Status: AC
Start: 2022-01-20 — End: 2022-01-20
  Administered 2022-01-20: 7 [IU] via SUBCUTANEOUS

## 2022-01-20 MED ORDER — PREDNISONE 1 MG PO TABS: 12.0000 mg | ORAL_TABLET | Freq: Every day | ORAL | 1 refills | Status: DC

## 2022-01-20 MED ORDER — NYSTATIN 100000 UNIT/ML MT SUSP: 5.0000 mL | Freq: Four times a day (QID) | OROMUCOSAL | 0 refills | Status: DC

## 2022-01-20 NOTE — Discharge Summary (Addendum)
Physician Discharge Summary  KEYATTA TOLLES KJZ:[id number] DOB: [birth year] DOA: 01/13/2022  PCP: Lajean Manes, MD  Admit date: 01/13/2022 Discharge date: 01/20/2022  Admitted From: Home Disposition:  Home  Recommendations for Outpatient Follow-up:  Follow up with PCP in 1-2 weeks Please obtain BMP/CBC in one week   Home Health:Yes Equipment/Devices:none  Discharge Condition:Stable CODE STATUS:Full Diet recommendation: Heart Healthy   Brief/Interim Summary: 88 y.o. female past medical history significant for essential hypertension, diabetes mellitus type 2 peripheral arterial disease, dermatomyositis comes in after a syncopal episode.  According to the daughter she was being assisted from the bathroom to the wheelchair, when she slumped over in the wheelchair and briefly lost consciousness there was no fall.  Here that she had a similar episode 4 days prior to admission.  Discharge Diagnoses:  Principal Problem:   Syncope Active Problems:   Oral thrush   Hypothyroidism, acquired   Type 2 diabetes mellitus without complication, with long-term current use of insulin (HCC)   Essential hypertension   GERD   Hyperlipidemia   Sepsis (HCC)   Dermatopolymyositis, unspecified, organ involvement unspecified ([place])   Fever, unknown origin   Elevated troponin   Fever   Aspiration pneumonia ([place])  Aspiration pneumonia: She has been on steroids which have been tapered down quickly as an outpatient. Chest x-ray was done that showed new infiltrates she was started IV empirically Unasyn and vancomycin. Speech evaluated the patient recommended dysphagia 3 diet. She completed antibiotics in house. Sepsis was rules out.  Syncope: Cardiology interrogated pacer showed bradycardia no A-fib extensive work-up was negative. Physical therapy evaluated the patient recommended skilled nursing facility. Question due to aspiration pneumonia.  Oral thrush: In the setting of steroids and antibiotic  use she was placed on Diflucan which she had a slight improvement, nystatin was added she will continue nystatin and Diflucan as an outpatient.  Sinus tachycardia/hypertension: She will start on oral metoprolol her blood pressure improved tachycardia resolved.  Dermatomyositis: Methotrexate was held she was continue steroid and steroid with taper down to 12 mg which she tolerated well. She continue this as an outpatient.  Diabetes mellitus type 2 uncontrolled with hyperglycemia: A1c of 8.9 no changes made to her regimen she will continue on her current regimen as an outpatient.  Microcytic anemia: Hemoglobin remained stable at 10.6.  Acquired hypothyroidism: No change made to her medication continue Synthroid.  Central hypertension: Blood pressure fairly controlled she was continued on her current medication and metoprolol was added.  GERD: Continue current home regimen no changes made.  Elevated troponins: Likely demand ischemia in the setting of aspiration pneumonia she denies any chest pain.  Left buttock stage II ulcer present on admission:  Discharge Instructions  Discharge Instructions     Diet - low sodium heart healthy   Complete by: As directed    Increase activity slowly   Complete by: As directed       Allergies as of 01/20/2022       Reactions   Nsaids Other (See Comments)   No nsaids due to kidney function!!!! Especially ibuprofen or Aleve.   Biaxin [clarithromycin] Hives   Sulfa Antibiotics Hives, Other (See Comments)   NO sulfa-based meds!!   Bactrim [sulfamethoxazole-trimethoprim] Hives, Rash   Lisinopril Cough        Medication List     TAKE these medications    acetaminophen 650 MG CR tablet Commonly known as: TYLENOL Take 1,300 mg by mouth at bedtime as needed for pain.   amLODipine  2.5 MG tablet Commonly known as: NORVASC Take 2.5 mg by mouth in the morning.   azelastine 0.1 % nasal spray Commonly known as: ASTELIN Place 2 sprays  into both nostrils 2 (two) times daily. Use in each nostril as directed What changed:  when to take this reasons to take this additional instructions   B-D UF III MINI PEN NEEDLES 31G X 5 MM Misc Generic drug: Insulin Pen Needle USE AS DIRECTED TID   Bayer Low Dose 81 MG tablet Generic drug: aspirin EC Take 81 mg by mouth daily. Swallow whole.   cetirizine 10 MG tablet Commonly known as: ZYRTEC Take 10 mg by mouth daily as needed for allergies.   docusate sodium 100 MG capsule Commonly known as: Colace Take 1 capsule (100 mg total) by mouth 2 (two) times daily.   feeding supplement (GLUCERNA SHAKE) Liqd Take 237 mLs by mouth 3 (three) times daily between meals. What changed:  when to take this reasons to take this   fluconazole 100 MG tablet Commonly known as: Diflucan Take 2 tablets (200 mg total) by mouth daily for 10 days.   fluticasone 50 MCG/ACT nasal spray Commonly known as: FLONASE Place 1-2 sprays into both nostrils daily as needed for allergies or rhinitis.   folic acid 1 MG tablet Commonly known as: FOLVITE Take 1 mg by mouth every evening.   Fusion Plus Caps Take 1 capsule by mouth daily with breakfast.   Levemir FlexTouch 100 UNIT/ML FlexPen Generic drug: insulin detemir Inject 3-7 Units into the skin See admin instructions. Inject 7 units into the skin in the morning and 3 units at bedtime if BGL is 200 or below and 4 units if BGL is greater than 200   levETIRAcetam 500 MG tablet Commonly known as: KEPPRA Take 1 tablet (500 mg total) by mouth 2 (two) times daily. What changed: when to take this   liver oil-zinc oxide 40 % ointment Commonly known as: DESITIN Apply topically 3 (three) times daily. What changed:  how much to take when to take this reasons to take this   losartan 50 MG tablet Commonly known as: COZAAR Take 50 mg by mouth daily.   methotrexate 2.5 MG tablet Commonly known as: RHEUMATREX Take 1 tablet (2.5 mg total) by mouth  once a week. Caution:Chemotherapy. Protect from light. Start taking on: January 27, 2022   metoprolol tartrate 25 MG tablet Commonly known as: LOPRESSOR Take 1 tablet (25 mg total) by mouth 2 (two) times daily.   montelukast 10 MG tablet Commonly known as: Singulair Take 1 tablet (10 mg total) by mouth at bedtime.   NovoLOG FlexPen 100 UNIT/ML FlexPen Generic drug: insulin aspart Inject 2-10 Units into the skin See admin instructions. Inject 2-10 units into the skin three times a day with meals, per sliding scale:  Breakfast: BGL 80-199 = 8 units; 200-299 = 9 units; 300 or greater = 10 units Lunch: BGL 80-199 = 5 units; 200-299 = 6 units; 300 or greater = 7 units Supper/evening meal: BGL 80-199 = 2 units; 200-299 = 3 units; 300 or greater = 4 units   nystatin 100000 UNIT/ML suspension Commonly known as: MYCOSTATIN Take 5 mLs (500,000 Units total) by mouth 4 (four) times daily. What changed:  when to take this reasons to take this   omega-3 acid ethyl esters 1 g capsule Commonly known as: LOVAZA Take 1 g by mouth 2 (two) times daily.   One-A-Day Womens Formula Tabs Take 1 tablet by mouth daily.  OneTouch Delica Lancets 63J Misc IC-10 CODE:  E11.65  Check blood sugar   OneTouch Verio test strip Generic drug: glucose blood 1 each 3 (three) times daily.   PEPCID AC PO Take 1 tablet by mouth at bedtime.   polyethylene glycol 17 g packet Commonly known as: MIRALAX / GLYCOLAX Take 17 g by mouth daily. What changed:  when to take this reasons to take this   predniSONE 1 MG tablet Commonly known as: DELTASONE Take 12 tablets (12 mg total) by mouth daily with breakfast. Start taking on: January 21, 2022 What changed:  medication strength how much to take   Synthroid 200 MCG tablet Generic drug: levothyroxine Take 200 mcg by mouth daily before breakfast.   VITAMIN D-3 PO Take 10,000 Units by mouth daily.        Follow-up Information     [place], [place] Follow up.   Specialty: Home Health Services Why: [facility] physical therapy/occupational therapy Contact information: [place] [address] [phone]                Allergies  Allergen Reactions   Nsaids Other (See Comments)    No nsaids due to kidney function!!!! Especially ibuprofen or Aleve.   Biaxin [Clarithromycin] Hives   Sulfa Antibiotics Hives and Other (See Comments)    NO sulfa-based meds!!   Bactrim [Sulfamethoxazole-Trimethoprim] Hives and Rash   Lisinopril Cough    Consultations: None   Procedures/Studies: DG Chest 1 View  Result Date: 01/16/2022 CLINICAL DATA:  Provided history: Encounter for fever. EXAM: CHEST  1 VIEW COMPARISON:  Chest radiographs 01/13/2022 and earlier. FINDINGS: Loop recorder device. A small object overlies the right lung base, likely overlying the patient. Heart size within normal limits. Aortic atherosclerosis. Ill-defined opacities within the left greater than right lung bases which may reflect atelectasis and/or pneumonia. Small left pleural effusion. No acute bony abnormality identified. IMPRESSION: Ill-defined opacities within the left greater than right lung bases, new from the prior chest radiograph of 01/13/2022. Findings may reflect atelectasis and/or pneumonia. Small left pleural effusion, also new from the prior exam. Aortic Atherosclerosis (ICD10-I70.0). Electronically Signed   By: Kellie Simmering D.O.   On: 01/16/2022 11:27   ECHOCARDIOGRAM COMPLETE  Result Date: 01/14/2022    ECHOCARDIOGRAM REPORT   Patient Name:   LUPITA ROSALES Date of Exam: 01/14/2022 Medical Rec #:  [id number]       Height:       67.0 in Accession #:    [id number]      Weight:       117.7 lb Date of Birth:  1934-01-13       BSA:          1.614 m Patient Age:    52 years        BP:           160/69 mmHg Patient Gender: F               HR:           77 bpm. Exam Location:  Inpatient Procedure: 2D Echo, Cardiac Doppler  and Color Doppler Indications:    Syncope  History:        Patient has prior history of Echocardiogram examinations. Risk                 Factors:Hypertension and Diabetes.  Sonographer:    Jyl Heinz Referring Phys: [id number] Jonnie Finner  Sonographer Comments: Pt  supine IMPRESSIONS  1. Left ventricular ejection fraction, by estimation, is 60 to 65%. The left ventricle has normal function. The left ventricle has no regional wall motion abnormalities. Left ventricular diastolic parameters are consistent with Grade I diastolic dysfunction (impaired relaxation).  2. Right ventricular systolic function is normal. The right ventricular size is normal.  3. The mitral valve is degenerative. No evidence of mitral valve regurgitation. No evidence of mitral stenosis.  4. The aortic valve is normal in structure. Aortic valve regurgitation is not visualized. No aortic stenosis is present.  5. The inferior vena cava is normal in size with greater than 50% respiratory variability, suggesting right atrial pressure of 3 mmHg. FINDINGS  Left Ventricle: Left ventricular ejection fraction, by estimation, is 60 to 65%. The left ventricle has normal function. The left ventricle has no regional wall motion abnormalities. The left ventricular internal cavity size was normal in size. There is  no left ventricular hypertrophy. Left ventricular diastolic parameters are consistent with Grade I diastolic dysfunction (impaired relaxation). Right Ventricle: The right ventricular size is normal. No increase in right ventricular wall thickness. Right ventricular systolic function is normal. Left Atrium: Left atrial size was normal in size. Right Atrium: Right atrial size was normal in size. Pericardium: There is no evidence of pericardial effusion. Mitral Valve: The mitral valve is degenerative in appearance. There is mild thickening of the mitral valve leaflet(s). There is mild calcification of the mitral valve leaflet(s). Mild mitral annular  calcification. No evidence of mitral valve regurgitation. No evidence of mitral valve stenosis. Tricuspid Valve: The tricuspid valve is normal in structure. Tricuspid valve regurgitation is not demonstrated. No evidence of tricuspid stenosis. Aortic Valve: The aortic valve is normal in structure. Aortic valve regurgitation is not visualized. No aortic stenosis is present. Aortic valve peak gradient measures 8.0 mmHg. Pulmonic Valve: The pulmonic valve was normal in structure. Pulmonic valve regurgitation is not visualized. No evidence of pulmonic stenosis. Aorta: The aortic root is normal in size and structure. Venous: The inferior vena cava is normal in size with greater than 50% respiratory variability, suggesting right atrial pressure of 3 mmHg. IAS/Shunts: No atrial level shunt detected by color flow Doppler.  LEFT VENTRICLE PLAX 2D LVIDd:         3.50 cm     Diastology LVIDs:         2.30 cm     LV e' medial:    6.53 cm/s LV PW:         1.00 cm     LV E/e' medial:  10.1 LV IVS:        1.30 cm     LV e' lateral:   5.55 cm/s LVOT diam:     2.00 cm     LV E/e' lateral: 11.9 LV SV:         67 LV SV Index:   41 LVOT Area:     3.14 cm  LV Volumes (MOD) LV vol d, MOD A2C: 56.3 ml LV vol d, MOD A4C: 54.6 ml LV vol s, MOD A2C: 19.8 ml LV vol s, MOD A4C: 20.8 ml LV SV MOD A2C:     36.5 ml LV SV MOD A4C:     54.6 ml LV SV MOD BP:      34.9 ml RIGHT VENTRICLE             IVC RV Basal diam:  3.00 cm     IVC diam: 1.20 cm RV Mid diam:  2.60 cm RV S prime:     11.70 cm/s TAPSE (M-mode): 1.9 cm LEFT ATRIUM             Index        RIGHT ATRIUM           Index LA diam:        2.60 cm 1.61 cm/m   RA Area:     10.80 cm LA Vol (A2C):   30.6 ml 18.95 ml/m  RA Volume:   21.70 ml  13.44 ml/m LA Vol (A4C):   27.9 ml 17.28 ml/m LA Biplane Vol: 31.0 ml 19.20 ml/m  AORTIC VALVE AV Area (Vmax): 2.50 cm AV Vmax:        141.00 cm/s AV Peak Grad:   8.0 mmHg LVOT Vmax:      112.00 cm/s LVOT Vmean:     84.700 cm/s LVOT VTI:        0.212 m  AORTA Ao Root diam: 2.90 cm Ao Asc diam:  2.30 cm MITRAL VALVE               TRICUSPID VALVE MV Area (PHT): 3.68 cm    TR Peak grad:   35.8 mmHg MV Decel Time: 206 msec    TR Vmax:        299.00 cm/s MR Peak grad: 87.8 mmHg MR Vmax:      468.50 cm/s  SHUNTS MV E velocity: 66.00 cm/s  Systemic VTI:  0.21 m MV A velocity: 80.10 cm/s  Systemic Diam: 2.00 cm MV E/A ratio:  0.82 Candee Furbish MD Electronically signed by Candee Furbish MD Signature Date/Time: 01/14/2022/4:58:44 PM    Final    EEG adult  Result Date: 01/14/2022 Lora Havens, MD     01/14/2022  7:03 PM Patient Name: ANAH BILLARD MRN: [id number] Epilepsy Attending: Lora Havens Referring Physician/Provider: Jonnie Finner, DO Date: 01/14/2022 Duration: 22.36 mins Patient history: 87yo F with syncope. EEG to evaluate for seizure. Level of alertness: Awake AEDs during EEG study:LEV Technical aspects: This EEG study was done with scalp electrodes positioned according to the 10-20 International system of electrode placement. Electrical activity was acquired at a sampling rate of '500Hz'$  and reviewed with a high frequency filter of '70Hz'$  and a low frequency filter of '1Hz'$ . EEG data were recorded continuously and digitally stored. Description: The posterior dominant rhythm consists of 8 Hz activity of moderate voltage (25-35 uV) seen predominantly in posterior head regions, symmetric and reactive to eye opening and eye closing. EEG showed intermittent generalized 3 to 6 Hz theta-delta slowing. Hyperventilation and photic stimulation were not performed.   ABNORMALITY - Intermittent slow, generalized IMPRESSION: This study is suggestive of mild diffuse encephalopathy, nonspecific etiology. No seizures or epileptiform discharges were seen throughout the recording. Lora Havens   DG Chest Port 1 View  Result Date: 01/13/2022 CLINICAL DATA:  Weakness. EXAM: PORTABLE CHEST 1 VIEW COMPARISON:  Chest x-ray dated September 13, 2021. FINDINGS: Unchanged  loop recorder. The heart size and mediastinal contours are within normal limits. Normal pulmonary vascularity. No focal consolidation, pleural effusion, or pneumothorax. No acute osseous abnormality. IMPRESSION: 1. No active disease. Electronically Signed   By: Titus Dubin M.D.   On: 01/13/2022 11:07   (Echo, Carotid, EGD, Colonoscopy, ERCP)    Subjective: No complaints  Discharge Exam: Vitals:   01/20/22 0452 01/20/22 1153  BP: (!) 154/63 (!) 128/48  Pulse: 66 71  Resp:  18  Temp: 98.4 F (36.9 C)  98.3 F (36.8 C)  SpO2: 96%    Vitals:   01/19/22 2150 01/20/22 0452 01/20/22 0455 01/20/22 1153  BP: 137/61 (!) 154/63  (!) 128/48  Pulse: 66 66  71  Resp:    18  Temp: 99.2 F (37.3 C) 98.4 F (36.9 C)  98.3 F (36.8 C)  TempSrc: Oral Oral  Oral  SpO2: 97% 96%    Weight:   55.6 kg   Height:        General: Pt is alert, awake, not in acute distress Cardiovascular: RRR, S1/S2 +, no rubs, no gallops Respiratory: CTA bilaterally, no wheezing, no rhonchi Abdominal: Soft, NT, ND, bowel sounds + Extremities: no edema, no cyanosis    The results of significant diagnostics from this hospitalization (including imaging, microbiology, ancillary and laboratory) are listed below for reference.     Microbiology: Recent Results (from the past 240 hour(s))  Urine Culture     Status: Abnormal   Collection Time: 01/13/22  1:10 PM   Specimen: Urine, Clean Catch  Result Value Ref Range Status   Specimen Description   Final    URINE, CLEAN CATCH Performed at [facility], [place] [address] [place], [place] [address]    Special Requests   Final    URINE CUP ONLY Performed at [facility], [place] [address], [place], [place] [address]    Culture (A)  Final    <10,000 COLONIES/mL INSIGNIFICANT GROWTH Performed at [facility] [address] [place], [place] [address]    Report Status 01/15/2022 FINAL  Final  Resp Panel by RT-PCR (Flu A&B,  Covid) Anterior Nasal Swab     Status: None   Collection Time: 01/13/22  1:28 PM   Specimen: Anterior Nasal Swab  Result Value Ref Range Status   SARS Coronavirus 2 by RT PCR NEGATIVE NEGATIVE Final    Comment: (NOTE) SARS-CoV-2 target nucleic acids are NOT DETECTED.  The SARS-CoV-2 RNA is generally detectable in upper respiratory specimens during the acute phase of infection. The lowest concentration of SARS-CoV-2 viral copies this assay can detect is 138 copies/mL. A negative result does not preclude SARS-Cov-2 infection and should not be used as the sole basis for treatment or other patient management decisions. A negative result may occur with  improper specimen collection/handling, submission of specimen other than nasopharyngeal swab, presence of viral mutation(s) within the areas targeted by this assay, and inadequate number of viral copies(<138 copies/mL). A negative result must be combined with clinical observations, patient history, and epidemiological information. The expected result is Negative.  Fact Sheet for Patients:  EntrepreneurPulse.com.au  Fact Sheet for Healthcare Providers:  IncredibleEmployment.be  This test is no t yet approved or cleared by the [place] FDA and  has been authorized for detection and/or diagnosis of SARS-CoV-2 by FDA under an Emergency Use Authorization (EUA). This EUA will remain  in effect (meaning this test can be used) for the duration of the COVID-19 declaration under Section 564(b)(1) of the Act, 21 U.S.C.section 360bbb-3(b)(1), unless the authorization is terminated  or revoked sooner.       Influenza A by PCR NEGATIVE NEGATIVE Final   Influenza B by PCR NEGATIVE NEGATIVE Final    Comment: (NOTE) The Xpert Xpress SARS-CoV-2/FLU/RSV plus assay is intended as an aid in the diagnosis of influenza from Nasopharyngeal swab specimens and should not be used as a sole basis for treatment. Nasal  washings and aspirates are unacceptable for Xpert Xpress SARS-CoV-2/FLU/RSV testing.  Fact Sheet for Patients: EntrepreneurPulse.com.au  Fact Sheet for Healthcare Providers: IncredibleEmployment.be  This test is not yet approved or cleared by the [place] FDA and has been authorized for detection and/or diagnosis of SARS-CoV-2 by FDA under an Emergency Use Authorization (EUA). This EUA will remain in effect (meaning this test can be used) for the duration of the COVID-19 declaration under Section 564(b)(1) of the Act, 21 U.S.C. section 360bbb-3(b)(1), unless the authorization is terminated or revoked.  Performed at [facility], [place] [address] [place], [place] [address]   Culture, blood (routine x 2)     Status: None   Collection Time: 01/13/22  2:14 PM   Specimen: BLOOD  Result Value Ref Range Status   Specimen Description   Final    BLOOD BLOOD RIGHT ARM UPPER ARM Performed at [facility] [address], [place], [place] [address]    Special Requests   Final    BOTTLES DRAWN AEROBIC AND ANAEROBIC Blood Culture adequate volume Performed at [facility] [address] [place], [place] [address]    Culture   Final    NO GROWTH 5 DAYS Performed at [facility], [place] [address] [place], [place] [address]    Report Status 01/18/2022 FINAL  Final  Culture, blood (routine x 2)     Status: None   Collection Time: 01/13/22  2:20 PM   Specimen: BLOOD LEFT WRIST  Result Value Ref Range Status   Specimen Description   Final    BLOOD LEFT WRIST Performed at [facility] [address], [place], [place] [address]    Special Requests   Final    BOTTLES DRAWN AEROBIC AND ANAEROBIC Blood Culture adequate volume Performed at [facility] [address], [place], [place] [address]    Culture   Final    NO GROWTH 5 DAYS Performed at [facility], [place] [address] [place], [place] [address]    Report Status 01/18/2022 FINAL  Final  Urine Culture     Status: None   Collection Time: 01/16/22 10:52 AM   Specimen: Urine, Clean Catch  Result Value Ref Range Status   Specimen Description   Final    URINE, CLEAN CATCH Performed at [facility], [place] [address], [place], [place] [address]    Special Requests   Final    NONE Performed at [facility], [place] [address], [place], [place] [address]    Culture   Final    NO GROWTH Performed at [facility], [place] [address], [place], [place] [address]    Report Status 01/17/2022 FINAL  Final  Culture, blood (Routine X 2) w Reflex to ID Panel     Status: None (Preliminary result)   Collection Time: 01/16/22 11:33 AM   Specimen: BLOOD  Result Value Ref Range Status   Specimen Description   Final    BLOOD LEFT ANTECUBITAL Performed at [facility] [address] [place], [place] [address]    Special Requests   Final    BOTTLES DRAWN AEROBIC ONLY Blood Culture adequate volume Performed at [facility] [address], [place], [place] [address]    Culture   Final    NO GROWTH 4 DAYS Performed at [facility], [place] [address] [place],  [address]    Report Status PENDING  Incomplete  Culture, blood (Routine X 2) w  Reflex to ID Panel     Status: None (Preliminary result)   Collection Time: 01/16/22 11:33 AM   Specimen: BLOOD LEFT FOREARM  Result Value Ref Range Status   Specimen Description   Final    BLOOD LEFT FOREARM Performed at [place] [address], [place], [place] [address]    Special Requests   Final    BOTTLES DRAWN AEROBIC ONLY Blood Culture adequate volume Performed at [facility] [address], [place], [place] [address]    Culture   Final    NO GROWTH 4 DAYS Performed at [facility], [place] [address]  [place], [place] [address]    Report Status PENDING  Incomplete     Labs: BNP (last 3 results) No results for input(s): "BNP" in the last 8760 hours. Basic Metabolic Panel: Recent Labs  Lab 01/13/22 1208 01/15/22 0440 01/16/22 0453 01/19/22 0456  NA 136  --  133*  --   K 4.1  --  3.8  --   CL 99  --  100  --   CO2 24  --  25  --   GLUCOSE 312*  --  266*  --   BUN 26*  --  13  --   CREATININE 0.86 0.72 0.72 0.60  CALCIUM 9.9  --  9.5  --    Liver Function Tests: Recent Labs  Lab 01/13/22 1208 01/16/22 0453  AST 23 34  ALT 27 29  ALKPHOS 42 44  BILITOT 1.0 0.8  PROT 6.1* 5.4*  ALBUMIN 3.2* 2.5*   No results for input(s): "LIPASE", "AMYLASE" in the last 168 hours. No results for input(s): "AMMONIA" in the last 168 hours. CBC: Recent Labs  Lab 01/13/22 1208 01/16/22 0453  WBC 11.4* 9.7  NEUTROABS 9.2*  --   HGB 10.6* 9.7*  HCT 32.2* 29.1*  MCV 102.5* 99.7  PLT 209 225   Cardiac Enzymes: No results for input(s): "CKTOTAL", "CKMB", "CKMBINDEX", "TROPONINI" in the last 168 hours. BNP: Invalid input(s): "POCBNP" CBG: Recent Labs  Lab 01/19/22 2151 01/20/22 0445 01/20/22 0557 01/20/22 0820 01/20/22 1151  GLUCAP 75 290* 311* 222* 283*   D-Dimer No results for input(s): "DDIMER" in the last 72 hours. Hgb A1c No results for input(s): "HGBA1C" in the last 72 hours. Lipid Profile No results for input(s): "CHOL", "HDL", "LDLCALC", "TRIG", "CHOLHDL", "LDLDIRECT" in the last 72 hours. Thyroid function studies No results for input(s): "TSH", "T4TOTAL", "T3FREE", "THYROIDAB" in the last 72 hours.  Invalid input(s): "FREET3" Anemia work up No results for input(s): "VITAMINB12", "FOLATE", "FERRITIN", "TIBC", "IRON", "RETICCTPCT" in the last 72 hours. Urinalysis    Component Value Date/Time   COLORURINE YELLOW 01/16/2022 1051   APPEARANCEUR CLEAR 01/16/2022 1051   LABSPEC 1.015 01/16/2022 1051   PHURINE 6.0 01/16/2022 1051   GLUCOSEU 150 (A) 01/16/2022 1051    HGBUR NEGATIVE 01/16/2022 1051   BILIRUBINUR NEGATIVE 01/16/2022 1051   BILIRUBINUR neg 05/06/2015 1421   KETONESUR 5 (A) 01/16/2022 1051   PROTEINUR 100 (A) 01/16/2022 1051   UROBILINOGEN 1.0 05/06/2015 1421   UROBILINOGEN 0.2 12/25/2014 1206   NITRITE NEGATIVE 01/16/2022 1051   LEUKOCYTESUR NEGATIVE 01/16/2022 1051   Sepsis Labs Recent Labs  Lab 01/13/22 1208 01/16/22 0453  WBC 11.4* 9.7   Microbiology Recent Results (from the past 240 hour(s))  Urine Culture     Status: Abnormal   Collection Time: 01/13/22  1:10 PM   Specimen: Urine, Clean Catch  Result Value  Ref Range Status   Specimen Description   Final    URINE, CLEAN CATCH Performed at [facility], [place] [address] [place], [place] [address]    Special Requests   Final    URINE CUP ONLY Performed at [facility], [place] [address] [place], [place] [address]    Culture (A)  Final    <10,000 COLONIES/mL INSIGNIFICANT GROWTH Performed at [place] [address], [place], [place] [address]    Report Status 01/15/2022 FINAL  Final  Resp Panel by RT-PCR (Flu A&B, Covid) Anterior Nasal Swab     Status: None   Collection Time: 01/13/22  1:28 PM   Specimen: Anterior Nasal Swab  Result Value Ref Range Status   SARS Coronavirus 2 by RT PCR NEGATIVE NEGATIVE Final    Comment: (NOTE) SARS-CoV-2 target nucleic acids are NOT DETECTED.  The SARS-CoV-2 RNA is generally detectable in upper respiratory specimens during the acute phase of infection. The lowest concentration of SARS-CoV-2 viral copies this assay can detect is 138 copies/mL. A negative result does not preclude SARS-Cov-2 infection and should not be used as the sole basis for treatment or other patient management decisions. A negative result may occur with  improper specimen collection/handling, submission of specimen other than nasopharyngeal swab, presence of viral mutation(s) within the areas targeted by this  assay, and inadequate number of viral copies(<138 copies/mL). A negative result must be combined with clinical observations, patient history, and epidemiological information. The expected result is Negative.  Fact Sheet for Patients:  EntrepreneurPulse.com.au  Fact Sheet for Healthcare Providers:  IncredibleEmployment.be  This test is no t yet approved or cleared by the [place] FDA and  has been authorized for detection and/or diagnosis of SARS-CoV-2 by FDA under an Emergency Use Authorization (EUA). This EUA will remain  in effect (meaning this test can be used) for the duration of the COVID-19 declaration under Section 564(b)(1) of the Act, 21 U.S.C.section 360bbb-3(b)(1), unless the authorization is terminated  or revoked sooner.       Influenza A by PCR NEGATIVE NEGATIVE Final   Influenza B by PCR NEGATIVE NEGATIVE Final    Comment: (NOTE) The Xpert Xpress SARS-CoV-2/FLU/RSV plus assay is intended as an aid in the diagnosis of influenza from Nasopharyngeal swab specimens and should not be used as a sole basis for treatment. Nasal washings and aspirates are unacceptable for Xpert Xpress SARS-CoV-2/FLU/RSV testing.  Fact Sheet for Patients: EntrepreneurPulse.com.au  Fact Sheet for Healthcare Providers: IncredibleEmployment.be  This test is not yet approved or cleared by the [place] FDA and has been authorized for detection and/or diagnosis of SARS-CoV-2 by FDA under an Emergency Use Authorization (EUA). This EUA will remain in effect (meaning this test can be used) for the duration of the COVID-19 declaration under Section 564(b)(1) of the Act, 21 U.S.C. section 360bbb-3(b)(1), unless the authorization is terminated or revoked.  Performed at [facility], [place] [address], [place], [place] [address]   Culture, blood (routine x 2)     Status: None   Collection Time:  01/13/22  2:14 PM   Specimen: BLOOD  Result Value Ref Range Status   Specimen Description   Final    BLOOD BLOOD RIGHT ARM UPPER ARM Performed at [place] [address], [place], [place] [address]    Special Requests   Final    BOTTLES DRAWN AEROBIC AND ANAEROBIC Blood Culture adequate volume Performed at [facility],  [place] [address], [place], [place] [address]    Culture   Final    NO GROWTH 5 DAYS Performed at [facility], [place] [address], [place], [place] [address]    Report Status 01/18/2022 FINAL  Final  Culture, blood (routine x 2)     Status: None   Collection Time: 01/13/22  2:20 PM   Specimen: BLOOD LEFT WRIST  Result Value Ref Range Status   Specimen Description   Final    BLOOD LEFT WRIST Performed at [facility] [address] [place], [place] [address]    Special Requests   Final    BOTTLES DRAWN AEROBIC AND ANAEROBIC Blood Culture adequate volume Performed at [place] [address] [place], [place] [address]    Culture   Final    NO GROWTH 5 DAYS Performed at [facility], [place] [address] [place], [place] [address]    Report Status 01/18/2022 FINAL  Final  Urine Culture     Status: None   Collection Time: 01/16/22 10:52 AM   Specimen: Urine, Clean Catch  Result Value Ref Range Status   Specimen Description   Final    URINE, CLEAN CATCH Performed at [facility], [place] [address], [place], [place] [address]    Special Requests   Final    NONE Performed at [facility], [place] [address] [place], [place] [address]    Culture   Final    NO GROWTH Performed at [facility], [place] [address], [place], [place] [address]    Report Status 01/17/2022 FINAL  Final  Culture, blood (Routine X 2) w Reflex to ID Panel     Status: None (Preliminary result)   Collection Time: 01/16/22 11:33 AM   Specimen: BLOOD  Result Value  Ref Range Status   Specimen Description   Final    BLOOD LEFT ANTECUBITAL Performed at [place] [address] [place], [place] [address]    Special Requests   Final    BOTTLES DRAWN AEROBIC ONLY Blood Culture adequate volume Performed at [place] [address], [place], [place] [address]    Culture   Final    NO GROWTH 4 DAYS Performed at [facility], [place] [address] [place], [place] [address]    Report Status PENDING  Incomplete  Culture, blood (Routine X 2) w Reflex to ID Panel     Status: None (Preliminary result)   Collection Time: 01/16/22 11:33 AM   Specimen: BLOOD LEFT FOREARM  Result Value Ref Range Status   Specimen Description   Final    BLOOD LEFT FOREARM Performed at [facility] [address], [place], [place] [address]    Special Requests   Final    BOTTLES DRAWN AEROBIC ONLY Blood Culture adequate volume Performed at [place] [address], [place], [place] [address]    Culture   Final    NO GROWTH 4 DAYS Performed at [facility], [place] [address], [place], [place] [address]    Report Status PENDING  Incomplete    SIGNED:   Charlynne Cousins, MD  [facility] 01/20/2022, 12:04 PM Pager   If 7PM-7AM, please contact night-coverage [email or web address] Password [email or web address]

## 2022-01-20 NOTE — TOC Transition Note (Signed)
Transition of Care [facility]) - CM/SW Discharge Note   Patient Details  Name: [name] [name] MRN: [id number] Date of Birth: [birth year]  [facility] CM/SW Contact:  Ross Ludwig, LCSW Phone Number: 01/20/2022, 10:02 AM   Clinical Narrative:     Patient's family declining SNF placement.  They would prefer to go home with home health.  Patient will be going home with home health through [facility] signing off please reconsult with any other social work needs, home health agency has been notified of planned discharge.   Final next level of care: [place] Barriers to Discharge: Barriers Resolved   Patient Goals and CMS Choice Patient states their goals for this hospitalization and ongoing recovery are:: To return back home with home health. CMS Medicare.gov Compare Post Acute Care list provided to:: Patient Represenative (must comment) Choice offered to / list presented to : Adult Children  Discharge Placement  Patient discharging home with home health.                     Discharge Plan and Services   Discharge Planning Services: CM Consult Post Acute Care Choice: Home Health                    HH Arranged: PT, OT [place] Agency: Other - See comment Elliot Cousin) Date HH Agency Contacted: 01/19/22 Time [place]: [id number] Representative spoke with at [place]: [place] ([place]) Interventions     Readmission Risk Interventions    01/19/2022   11:11 AM  Readmission Risk Prevention Plan  Transportation Screening Complete  PCP or Specialist Appt within 3-5 Days Complete  HRI or [place] Complete  Social Work Consult for [place] Planning/Counseling Complete  Palliative Care Screening Not Applicable  Medication Review Press photographer) Complete

## 2022-01-20 NOTE — Progress Notes (Signed)
Patient discharging home. Vital signs stable at time of discharge as reflected in discharge summary. Discharge instructions given to daughters and verbal understanding returned. No question at this time.

## 2022-01-20 NOTE — Plan of Care (Signed)

## 2022-01-21 LAB — CULTURE, BLOOD (ROUTINE X 2)
Culture: NO GROWTH
Culture: NO GROWTH
Special Requests: ADEQUATE
Special Requests: ADEQUATE

## 2022-01-24 LAB — CUP PACEART REMOTE DEVICE CHECK
Date Time Interrogation Session: 20230703230455
Implantable Pulse Generator Implant Date: 20210203

## 2022-01-26 ENCOUNTER — Ambulatory Visit (INDEPENDENT_AMBULATORY_CARE_PROVIDER_SITE_OTHER): Payer: Medicare HMO

## 2022-01-26 DIAGNOSIS — I6389 Other cerebral infarction: Secondary | ICD-10-CM

## 2022-01-26 DIAGNOSIS — J690 Pneumonitis due to inhalation of food and vomit: Secondary | ICD-10-CM | POA: Diagnosis not present

## 2022-01-26 DIAGNOSIS — M3390 Dermatopolymyositis, unspecified, organ involvement unspecified: Secondary | ICD-10-CM | POA: Diagnosis not present

## 2022-01-26 DIAGNOSIS — B370 Candidal stomatitis: Secondary | ICD-10-CM | POA: Diagnosis not present

## 2022-01-26 DIAGNOSIS — R1310 Dysphagia, unspecified: Secondary | ICD-10-CM | POA: Diagnosis not present

## 2022-01-26 DIAGNOSIS — L89329 Pressure ulcer of left buttock, unspecified stage: Secondary | ICD-10-CM | POA: Diagnosis not present

## 2022-01-26 DIAGNOSIS — E782 Mixed hyperlipidemia: Secondary | ICD-10-CM | POA: Diagnosis not present

## 2022-01-30 ENCOUNTER — Encounter: Payer: Self-pay | Admitting: Physician Assistant

## 2022-01-30 ENCOUNTER — Ambulatory Visit (INDEPENDENT_AMBULATORY_CARE_PROVIDER_SITE_OTHER): Payer: Medicare HMO | Admitting: Physician Assistant

## 2022-01-30 VITALS — BP 116/40 | HR 66 | Ht 66.0 in | Wt 96.0 lb

## 2022-01-30 DIAGNOSIS — R55 Syncope and collapse: Secondary | ICD-10-CM | POA: Diagnosis not present

## 2022-01-30 DIAGNOSIS — Z4509 Encounter for adjustment and management of other cardiac device: Secondary | ICD-10-CM | POA: Diagnosis not present

## 2022-01-30 NOTE — Progress Notes (Signed)
Cardiology Office Note Date:  01/30/2022  Patient ID:  [name] [name] [birth year], MRN [id number] PCP:  Lajean Manes, MD  Electrophysiologist: Dr. Lovena Le     Chief Complaint: recurrent syncope  History of Present Illness: [name] [name] is a 88 y.o. female with history of DM, HTN, HLD, CKD (III), hypothyroidism, Chronic back pain, PVD, sTIA > loop implant, dementia, dermatomyositis   She has not been in the office before, loop implanted during a hospitalization with cryptogenic stroke and had loop implanted 2021.  Seeing neurology with progressive weakness to the point of needing help with ADLs, eventually requiring max assist, able help with transfers, can stand to do minimal tasks.  Feb 2023, admitted with syncope, hypoglycemic, hyponatremic Family gave CPR, EMS on arrival had pulse Apparently had a syncopal event with EMS< though I can not track down any cardiac/rhythm/rate data  She was admitted 01/13/22 - 01/20/22 with recurrent syncope. Treated for: Aspiration pneumonia, perhaps etiology of her syncope (?) Loop interrogated though not programmed for brady detection, reportedly no AFib noted Oral thrush (old dx) HTN/tachycardia and started on metoprolol Dermatomyositis (old dx)  Recommended it seem to f/u here to have her loop programmed appropriately now with syncope history  TODAY She comes today with her two daughters that she lives with. The daughters work from home and are helping with her care. They deny the hx of stroke, report that she has never been found to for sure have had a stroke (in further review was a TIA that prompted the loop) She has been suffering with recurrent episodes of syncope.  The most recent she had just finished on the commode, urinated and BM, during the process of finishing up, cleaning up, re-dressing she had sudden onset of stiffening/perhaps shaking arms and became lethargic, very difficult to arouse,  perhaps lasting 10  minutes  Another was on as she was being transferred to the toilet or just about to her daughter noticed she had a sweaty brow and then became lethargic, loss of tone in her neck, head tilted back, eyes seemed to roll back, moaning,   These are not new events it seems, but are happening more frequently  The only thing that seems the same is she feels hot 1st No other symptoms that she/they have noticed. She denies any CP, palpitations, SOB    Device information MDT LINQ II, implanted 08/23/2019   Past Medical History:  Diagnosis Date   Arthritis    "knees, legs" (06/10/2016)   Ascites    Chronic kidney disease    "related to my diabetes"   Chronic lower back pain    Diabetes mellitus without complication (HCC)    Eczema    GERD (gastroesophageal reflux disease)    High cholesterol    Hyperlipidemia    Hypertension    Hypothyroidism    OAB (overactive bladder)    Osteoporosis    Thyroid disease    Type II diabetes mellitus ([place])    Urticaria     Past Surgical History:  Procedure Laterality Date   ABDOMINAL HYSTERECTOMY     KNEE ARTHROSCOPY     LAPAROTOMY N/A 12/03/2016   Procedure: EXPLORATORY LAPAROTOMY, LYSIS OF ADHESIONS;  Surgeon: Armandina Gemma, MD;  Location: [facility];  Service: General;  Laterality: N/A;   LOOP RECORDER INSERTION N/A 08/23/2019   Procedure: LOOP RECORDER INSERTION;  Surgeon: Evans Lance, MD;  Location: [facility];  Service: Cardiovascular;  Laterality: N/A;   vocal cord polyps  Current Outpatient Medications  Medication Sig Dispense Refill   acetaminophen (TYLENOL) 650 MG CR tablet Take 1,300 mg by mouth at bedtime as needed for pain.     amLODipine (NORVASC) 2.5 MG tablet Take 2.5 mg by mouth in the morning.     azelastine (ASTELIN) 0.1 % nasal spray Place 2 sprays into both nostrils 2 (two) times daily. Use in each nostril as directed (Patient taking differently: Place 2 sprays into both nostrils 2 (two) times daily as needed for  allergies.) 30 mL 2   B-D UF III MINI PEN NEEDLES 31G X 5 MM MISC USE AS DIRECTED TID     BAYER LOW DOSE 81 MG EC tablet Take 81 mg by mouth daily. Swallow whole.     cetirizine (ZYRTEC) 10 MG tablet Take 10 mg by mouth daily as needed for allergies.     Cholecalciferol (VITAMIN D-3 PO) Take 10,000 Units by mouth daily.     docusate sodium (COLACE) 100 MG capsule Take 1 capsule (100 mg total) by mouth 2 (two) times daily. 10 capsule 0   Famotidine (PEPCID AC PO) Take 1 tablet by mouth at bedtime.     feeding supplement, GLUCERNA SHAKE, (GLUCERNA SHAKE) LIQD Take 237 mLs by mouth 3 (three) times daily between meals. (Patient taking differently: Take 237 mLs by mouth daily as needed.) 10000 mL 0   fluconazole (DIFLUCAN) 100 MG tablet Take 2 tablets (200 mg total) by mouth daily for 10 days. 20 tablet 0   fluticasone (FLONASE) 50 MCG/ACT nasal spray Place 1-2 sprays into both nostrils daily as needed for allergies or rhinitis.     folic acid (FOLVITE) 1 MG tablet Take 1 mg by mouth every evening.     insulin aspart (NOVOLOG FLEXPEN) 100 UNIT/ML FlexPen Inject 2-10 Units into the skin See admin instructions. Inject 2-10 units into the skin three times a day with meals, per sliding scale:  Breakfast: BGL 80-199 = 8 units; 200-299 = 9 units; 300 or greater = 10 units Lunch: BGL 80-199 = 5 units; 200-299 = 6 units; 300 or greater = 7 units Supper/evening meal: BGL 80-199 = 2 units; 200-299 = 3 units; 300 or greater = 4 units     insulin detemir (LEVEMIR FLEXTOUCH) 100 UNIT/ML FlexPen Inject 3-7 Units into the skin See admin instructions. Inject 7 units into the skin in the morning and 3 units at bedtime if BGL is 200 or below and 4 units if BGL is greater than 200     Iron-FA-B Cmp-C-Biot-Probiotic (FUSION PLUS) CAPS Take 1 capsule by mouth daily with breakfast.     levETIRAcetam (KEPPRA) 500 MG tablet Take 1 tablet (500 mg total) by mouth 2 (two) times daily. (Patient taking differently: Take 500 mg by  mouth in the morning and at bedtime.) 60 tablet 2   liver oil-zinc oxide (DESITIN) 40 % ointment Apply topically 3 (three) times daily. (Patient taking differently: Apply 1 Application topically daily as needed for irritation.) 56.7 g 0   losartan (COZAAR) 50 MG tablet Take 50 mg by mouth daily.     methotrexate (RHEUMATREX) 2.5 MG tablet Take 1 tablet (2.5 mg total) by mouth once a week. Caution:Chemotherapy. Protect from light. 4 tablet 0   metoprolol tartrate (LOPRESSOR) 25 MG tablet Take 1 tablet (25 mg total) by mouth 2 (two) times daily. 60 tablet 3   montelukast (SINGULAIR) 10 MG tablet Take 1 tablet (10 mg total) by mouth at bedtime. (Patient not taking: Reported on 09/13/2021)  30 tablet 5   Multiple Vitamins-Calcium (ONE-A-DAY WOMENS FORMULA) TABS Take 1 tablet by mouth daily.     nystatin (MYCOSTATIN) 100000 UNIT/ML suspension Take 5 mLs (500,000 Units total) by mouth 4 (four) times daily. 60 mL 0   omega-3 acid ethyl esters (LOVAZA) 1 g capsule Take 1 g by mouth 2 (two) times daily.     OneTouch Delica Lancets 16R MISC IC-10 CODE:  E11.65  Check blood sugar     ONETOUCH VERIO test strip 1 each 3 (three) times daily.     polyethylene glycol (MIRALAX / GLYCOLAX) 17 g packet Take 17 g by mouth daily. (Patient taking differently: Take 17 g by mouth daily as needed for mild constipation.) 14 each 0   predniSONE (DELTASONE) 1 MG tablet Take 12 tablets (12 mg total) by mouth daily with breakfast. 30 tablet 1   SYNTHROID 200 MCG tablet Take 200 mcg by mouth daily before breakfast.     No current facility-administered medications for this visit.    Allergies:   Nsaids, Biaxin [clarithromycin], Sulfa antibiotics, Bactrim [sulfamethoxazole-trimethoprim], and Lisinopril   Social History:  The patient  reports that she has never smoked. She quit smokeless tobacco use about 38 years ago.  Her smokeless tobacco use included chew. She reports that she does not drink alcohol and does not use drugs.    Family History:  The patient's family history includes Asthma in her father; Diabetes in her brother, mother, and sister; Hypertension in her mother; Stomach cancer in her sister.  ROS:  Please see the history of present illness.    All other systems are reviewed and otherwise negative.   PHYSICAL EXAM:  VS:  There were no vitals taken for this visit. BMI: There is no height or weight on file to calculate BMI. Well nourished, well developed, in no acute distress HEENT: normocephalic, atraumatic Neck: no JVD, carotid bruits or masses Cardiac:  RRR; no significant murmurs, no rubs, or gallops Lungs:  CTA b/l, no wheezing, rhonchi or rales Abd: soft, nontender MS: no deformity, advanced atrophy Ext: 1+ edema (reported as chronic) Skin: warm and dry, no rash Neuro:  No gross deficits appreciated Psych: euthymic mood, full affect  ILR site is stable, no tethering or discomfort   EKG:  not done today  Device interrogation done today and reviewed by myself:  Battery is good R waves 1/13 There are 7 logged (old ) episodes as Af, EGMs reviewed are ST/PACs, no true AFib None of the tachy episodes correlate with any hospitalizations Tachy/brady were turned on remotely (after her last hospitalization) I made these more sensitive at least temporarily with brady rate of 40 and tachy at 158  01/14/2022: TTE  1. Left ventricular ejection fraction, by estimation, is 60 to 65%. The  left ventricle has normal function. The left ventricle has no regional  wall motion abnormalities. Left ventricular diastolic parameters are  consistent with Grade I diastolic  dysfunction (impaired relaxation).   2. Right ventricular systolic function is normal. The right ventricular  size is normal.   3. The mitral valve is degenerative. No evidence of mitral valve  regurgitation. No evidence of mitral stenosis.   4. The aortic valve is normal in structure. Aortic valve regurgitation is  not visualized. No  aortic stenosis is present.   5. The inferior vena cava is normal in size with greater than 50%  respiratory variability, suggesting right atrial pressure of 3 mmHg.   Recent Labs: 09/13/2021: TSH 1.566 01/16/2022: ALT  29; BUN 13; Hemoglobin 9.7; Platelets 225; Potassium 3.8; Sodium 133 01/19/2022: Creatinine, Ser 0.60  No results found for requested labs within last 365 days.   Estimated Creatinine Clearance: 45.1 mL/min (by C-G formula based on SCr of 0.6 mg/dL).   Wt Readings from Last 3 Encounters:  01/20/22 122 lb 9.2 oz (55.6 kg)  09/13/21 114 lb 3.2 oz (51.8 kg)  08/06/21 125 lb (56.7 kg)     Other studies reviewed: Additional studies/records reviewed today include: summarized above  ASSESSMENT AND PLAN:  Cryptogenic TIA ILR No AF to date  Recurrent syncope Loop programmed as above Symptom activator provided and instructed on how to use Episodes can last several minutes, as much as 10 minutes or so, and do not suspect HR/rhythm, but will look Repeat BP is 120/50 by myself I have asked them to monitor her BP at home    Disposition: F/u with 6 weeks or so, sooner if needed.  Current medicines are reviewed at length with the patient today.  The patient did not have any concerns regarding medicines.  Venetia Night, PA-C 01/30/2022 7:34 AM     [place] [address] [phone] (office)  [phone] (fax)

## 2022-01-30 NOTE — Patient Instructions (Signed)
Medication Instructions:  Your physician recommends that you continue on your current medications as directed. Please refer to the Current Medication list given to you today.  *If you need a refill on your cardiac medications before your next appointment, please call your pharmacy*   Lab Work: None If you have labs (blood work) drawn today and your tests are completely normal, you will receive your results only by: [place] (if you have MyChart) OR A paper copy in the mail If you have any lab test that is abnormal or we need to change your treatment, we will call you to review the results.   Follow-Up: At [facility], you and your health needs are our priority.  As part of our continuing mission to provide you with exceptional heart care, we have created designated Provider Care Teams.  These Care Teams include your primary Cardiologist (physician) and Advanced Practice Providers (APPs -  Physician Assistants and Nurse Practitioners) who all work together to provide you with the care you need, when you need it.   Your next appointment:   03/13/2022  Other Instructions Monitor you blood pressure at home

## 2022-02-04 NOTE — Telephone Encounter (Signed)
Called pt daughter Edwin Cap) ok per DPR.  Reports around 11 am she went to pt room to check on her.  Pt was responding appropriately initially then stopped responding.  Eyes rolled up in head, broke out in a sweat and body became limp.  Daughter called 70 and while on phone pt came around and said Im okay.  Daughter rubbed pt chest prior to coming around.  Daughter then told operator pt did not want to go to hospital. Daughter pushed button for recorder but is unsure if heart tracing was captured as pt machine is not with pt.   No recent BP/HR available, blood sugar 115 prior to event and 121 while on phone with nurse.  Spoke with pt expresses not having any pain.  Speech noted to be slurred however daughter does not note anything abnormal reports mom is back to normal.   Advised daughter to have pt evaluated.  Daughter expresses she is not going to keep sending pt to the hospital when no one knows what is going on with pt.  Can't keep getting Dr bills when they are not getting any answers. Asked if  she has reached out to PCP; PCP contacted on my chart no reply as of time of phone call.  When asked if contacted neurology daughter asked why would she contact neurology. I explained that symptoms could be r/t neurological system per pt history. Daughter reply she wants Joseph Art to address.  Advised I will send a message to our device clinic to follow up.  Asked that she call or send in a my chart message once monitor is at pt side.  Also advised that if episode happens again to call 911.  Will route to [place], [place] to address.

## 2022-02-04 NOTE — Telephone Encounter (Signed)
Spoke with patients daughter, patients daughter stated patient has not been sleeping beside monitor as patient stays with daughter during the week, informed patients daughter that in order to receive updates patient needs to be beside monitor every night, patients daughter  stated that they are going to go get monitor from patients house informed her it would be tomorrow before any information is available.

## 2022-02-04 NOTE — Telephone Encounter (Signed)
Called daughter back advised of provider recommendations.  She has not received a reply from PMD at this time.  Reports pt saw neurology in 2022 for cervical stenosis.  Is open to seeing a neurologist if needed.  Denies facial droop, limb weakness and slurred speech.  Reports pt is currently doing okay.  Reports episode today is typical for pt when has syncopal episodes.  Expressed that this episode is mild for pt.   Encouraged her to get pt monitor and BP cuff ASAP.  She expresses will obtain soon.  Daughter had no further concerns.

## 2022-02-05 ENCOUNTER — Telehealth: Payer: Self-pay

## 2022-02-05 NOTE — Telephone Encounter (Signed)
Spoke to patients daughter. Daughter states she pressed the symptom activator yesterday, although I do not see one logged. Asked daughter to press now and we will check tomorrow 02/06/22 to see if it marked.  Symptom activator confirmed to be the correct one with ILR 2. Also advised of BP & HR education per Renee. Thanked me for call and advised someone will f/u tomorrow about symptom activator.

## 2022-02-05 NOTE — Telephone Encounter (Signed)
Device interrogation completed.

## 2022-02-05 NOTE — Telephone Encounter (Signed)
Spoke with patient's daughter Howell Rucks and scheduled a Mychart Palliative Consult for 02/12/22 @ 1:30 PM.  Consent obtained; updated Netsmart, Team List and Epic.

## 2022-02-06 DIAGNOSIS — I739 Peripheral vascular disease, unspecified: Secondary | ICD-10-CM | POA: Diagnosis not present

## 2022-02-06 DIAGNOSIS — E1151 Type 2 diabetes mellitus with diabetic peripheral angiopathy without gangrene: Secondary | ICD-10-CM | POA: Diagnosis not present

## 2022-02-06 DIAGNOSIS — L97522 Non-pressure chronic ulcer of other part of left foot with fat layer exposed: Secondary | ICD-10-CM | POA: Diagnosis not present

## 2022-02-06 DIAGNOSIS — L603 Nail dystrophy: Secondary | ICD-10-CM | POA: Diagnosis not present

## 2022-02-09 DIAGNOSIS — L89893 Pressure ulcer of other site, stage 3: Secondary | ICD-10-CM | POA: Diagnosis not present

## 2022-02-09 DIAGNOSIS — M3390 Dermatopolymyositis, unspecified, organ involvement unspecified: Secondary | ICD-10-CM | POA: Diagnosis not present

## 2022-02-09 DIAGNOSIS — Z794 Long term (current) use of insulin: Secondary | ICD-10-CM | POA: Diagnosis not present

## 2022-02-09 DIAGNOSIS — Z7952 Long term (current) use of systemic steroids: Secondary | ICD-10-CM | POA: Diagnosis not present

## 2022-02-09 DIAGNOSIS — E119 Type 2 diabetes mellitus without complications: Secondary | ICD-10-CM | POA: Diagnosis not present

## 2022-02-09 DIAGNOSIS — M4802 Spinal stenosis, cervical region: Secondary | ICD-10-CM | POA: Diagnosis not present

## 2022-02-09 DIAGNOSIS — I10 Essential (primary) hypertension: Secondary | ICD-10-CM | POA: Diagnosis not present

## 2022-02-09 DIAGNOSIS — M159 Polyosteoarthritis, unspecified: Secondary | ICD-10-CM | POA: Diagnosis not present

## 2022-02-09 DIAGNOSIS — Z7982 Long term (current) use of aspirin: Secondary | ICD-10-CM | POA: Diagnosis not present

## 2022-02-09 DIAGNOSIS — Z9181 History of falling: Secondary | ICD-10-CM | POA: Diagnosis not present

## 2022-02-12 ENCOUNTER — Encounter: Payer: Medicare HMO | Admitting: Nurse Practitioner

## 2022-02-12 NOTE — Progress Notes (Signed)
This encounter was created in error - please disregard.

## 2022-02-13 ENCOUNTER — Telehealth: Payer: Self-pay | Admitting: Nurse Practitioner

## 2022-02-13 NOTE — Telephone Encounter (Signed)
Attempted to call daughter [name] back to reschedule Palliative Consult (due to No Show), no answer - left VM requesting a return call to reschedule.

## 2022-02-17 ENCOUNTER — Telehealth: Payer: Self-pay

## 2022-02-17 NOTE — Telephone Encounter (Signed)
Successful telephone encounter to daughter to follow up on patient "episode". Per daughter patient has been having near syncopal episodes that begin with clammy sweats. Today patient had another episode and daughter reportedly pushed symptom activator. Upon review in carelink, it is noted that there was previously unsuccessful reprogramming to turn on activator. Confirmed brady and pause remain on for dx syncope. Reprogrammed again today to allow symptom activation and will await device connection with remote monitor to complete. Discussed with [employer] rep Kandra Nicolas who was assisted by [employer] tech support to determine cause of previous reprogramming failure.   Discussed with daughter that patient is unable to force transmission and will have to wait until tonight's transmission to assess for symptoms. Request call back to Daughter Nicholos Johns at [phone] once transmission has been reviewed in the morning. These episodes are not a new finding.

## 2022-02-18 NOTE — Telephone Encounter (Signed)
Spoke with patients daughters, the settings that were sent over via CareLink have still not processed, patients daughter agreeable to bring patient in tomorrow to check loop recorder in clinic as well as make programming changes/ patiens daughter instructed to bring home monitor. Will have industry come and check loop recorder/home monitor.

## 2022-02-19 ENCOUNTER — Ambulatory Visit (INDEPENDENT_AMBULATORY_CARE_PROVIDER_SITE_OTHER): Payer: Medicare HMO

## 2022-02-19 DIAGNOSIS — R55 Syncope and collapse: Secondary | ICD-10-CM

## 2022-02-19 DIAGNOSIS — R54 Age-related physical debility: Secondary | ICD-10-CM | POA: Diagnosis not present

## 2022-02-19 DIAGNOSIS — Z993 Dependence on wheelchair: Secondary | ICD-10-CM | POA: Diagnosis not present

## 2022-02-20 DIAGNOSIS — L97522 Non-pressure chronic ulcer of other part of left foot with fat layer exposed: Secondary | ICD-10-CM | POA: Diagnosis not present

## 2022-02-20 NOTE — Progress Notes (Signed)
In-Clinic LINQ check to ensure brady/pause and symptom are on, parameters programmed on, transmission received from home monitor the test symptom activator

## 2022-02-24 DIAGNOSIS — L89893 Pressure ulcer of other site, stage 3: Secondary | ICD-10-CM | POA: Diagnosis not present

## 2022-02-25 DIAGNOSIS — M3390 Dermatopolymyositis, unspecified, organ involvement unspecified: Secondary | ICD-10-CM | POA: Diagnosis not present

## 2022-02-25 DIAGNOSIS — R404 Transient alteration of awareness: Secondary | ICD-10-CM | POA: Diagnosis not present

## 2022-02-25 DIAGNOSIS — H6123 Impacted cerumen, bilateral: Secondary | ICD-10-CM | POA: Diagnosis not present

## 2022-02-25 NOTE — Progress Notes (Signed)
Carelink Summary Report / Loop Recorder 

## 2022-02-26 LAB — CUP PACEART REMOTE DEVICE CHECK
Date Time Interrogation Session: 20230805230752
Implantable Pulse Generator Implant Date: 20210203

## 2022-03-03 ENCOUNTER — Ambulatory Visit: Payer: Medicare HMO | Admitting: Nurse Practitioner

## 2022-03-03 ENCOUNTER — Encounter: Payer: Self-pay | Admitting: Nurse Practitioner

## 2022-03-03 DIAGNOSIS — R630 Anorexia: Secondary | ICD-10-CM | POA: Diagnosis not present

## 2022-03-03 DIAGNOSIS — Z515 Encounter for palliative care: Secondary | ICD-10-CM | POA: Diagnosis not present

## 2022-03-03 DIAGNOSIS — R634 Abnormal weight loss: Secondary | ICD-10-CM | POA: Diagnosis not present

## 2022-03-03 DIAGNOSIS — R5381 Other malaise: Secondary | ICD-10-CM | POA: Diagnosis not present

## 2022-03-03 NOTE — Progress Notes (Signed)
[name] [name] Consult Note Telephone: [phone]  Fax: [phone]    Date of encounter: 03/03/22 2:05 PM PATIENT NAME: [name] [name] [address] [place] [address]   [phone] (home)  DOB: 1934/07/17 MRN: [id number] PRIMARY CARE PROVIDER:    Lajean Manes, MD,  [place] [name] [name] [place] [address] [phone]  RESPONSIBLE PARTY:    Contact Information     Name Relation Home Work [name] [name] Daughter   [phone]   [name] [name] Daughter [phone]        Due to the COVID-19 crisis, this visit was done via telemedicine from my office and it was initiated and consent by this patient and or family.  I connected with [name] [name], [name] [name] daughter, with  [name] [name] on 03/03/22 by telephone as video not available enabled telemedicine application and verified that I am speaking with the correct person using two identifiers.   I discussed the limitations of evaluation and management by telemedicine. The patient expressed understanding and agreed to proceed. Palliative Care was asked to follow this patient by consultation request of  [name] Manes, MD to address advance care planning and complex medical decision making. This is a follow up visit.                                  ASSESSMENT AND PLAN / RECOMMENDATIONS:  Symptom Management/Plan: 1. Advance Care Planning;  full code, ongoing discussions  2. Goals of Care: Goals include to maximize quality of life and symptom management. Our advance care planning conversation included a discussion about:    The value and importance of advance care planning  Exploration of personal, cultural or spiritual beliefs that might influence medical decisions  Exploration of goals of care in the event of a sudden injury or illness  Identification and preparation of a healthcare agent  Review and updating or creation of an advance  directive document.  3. Debility secondary to deconditioning, discussed monitoring activity level, encourage mobility, talked about transfers. Fall risk.  4. Anorexia with weight loss; continue to monitor appetite.  06/03/2021 weight 125 lbs 01/30/2022 weight 96 lbs 29 lbs; BMI 15.49; 23.20% loss  5. Palliative care encounter; Palliative care encounter; Palliative medicine team will continue to support patient, patient's family, and medical team. Visit consisted of counseling and education dealing with the complex and emotionally intense issues of symptom management and palliative care in the setting of serious and potentially life-threatening illness  Follow up Palliative Care Visit: Palliative care will continue to follow for complex medical decision making, advance care planning, and clarification of goals. Return 8 weeks for [name] NP and 4 weeks for [name] RN or prn.  I spent 32 minutes providing this consultation. More than 50% of the time in this consultation was spent in counseling and care coordination. PPS: 40% Chief Complaint: Initial consult palliative consult for complex medical decision making, address goals, manage ongoing symptoms  HISTORY OF PRESENT ILLNESS:  [name] [name] is a 88 y.o. year old female  with multiple medical problems including HTN, DM, PAD, dermatomyositis, pacer, hypothyroidism, gerd, hypothyroidism. Hospitalized [birth year]13[birth year]87 yo 01/20/2022 for syncope episode, with aspiration pna, elevated troponin, fever, sepsis, dermatopolymyosis. Stabilized and d/c home. I called [name] [name] for telephonic, telemedicine pc initial visit. We talked about purpose of pc, last time [name] was independent which was this past January (7  months ago). We talked about hospitalization, past medical history, ros, functional and cognitive abilities. We talked about weights, nutrition. We talked about sleeping >16 hrs daily. We talked about medical goals. We talked about daily routine, quality of life. We  talked about overall decline. We talked about family dynamics family and social history reviewed. [name] does go between 2 daughters homes. We talked about f/u pc visit with PC RN in 1 to 2 weeks in person for further assessment, measurements. [name] in agreement. We scheduled a f/u visit, maybe eligible for hospice based on weight loss, will need further discussions, assessment. [name] in agreement, scheduled. Therapeutic listening, emotional support provided. Questions answered.   History obtained from review of EMR, discussion with [name] Partridge, [name] daughter with [name].  I reviewed available labs, medications, imaging, studies and related documents from the EMR.  Records reviewed and summarized above.   ROS 10 point system reviewed all negative except HPI  Physical Exam: Deferred Thank you for the opportunity to participate in the care of [name].  The palliative care team will continue to follow. Please call our office at [phone] if we can be of additional assistance.   [name] [name] [name] Gully, NP

## 2022-03-05 DIAGNOSIS — H6123 Impacted cerumen, bilateral: Secondary | ICD-10-CM | POA: Diagnosis not present

## 2022-03-05 DIAGNOSIS — L89893 Pressure ulcer of other site, stage 3: Secondary | ICD-10-CM | POA: Diagnosis not present

## 2022-03-06 DIAGNOSIS — L97522 Non-pressure chronic ulcer of other part of left foot with fat layer exposed: Secondary | ICD-10-CM | POA: Diagnosis not present

## 2022-03-10 DIAGNOSIS — Z79899 Other long term (current) drug therapy: Secondary | ICD-10-CM | POA: Diagnosis not present

## 2022-03-10 DIAGNOSIS — M4802 Spinal stenosis, cervical region: Secondary | ICD-10-CM | POA: Diagnosis not present

## 2022-03-10 DIAGNOSIS — M1991 Primary osteoarthritis, unspecified site: Secondary | ICD-10-CM | POA: Diagnosis not present

## 2022-03-10 DIAGNOSIS — R21 Rash and other nonspecific skin eruption: Secondary | ICD-10-CM | POA: Diagnosis not present

## 2022-03-10 DIAGNOSIS — Z681 Body mass index (BMI) 19 or less, adult: Secondary | ICD-10-CM | POA: Diagnosis not present

## 2022-03-10 DIAGNOSIS — M3390 Dermatopolymyositis, unspecified, organ involvement unspecified: Secondary | ICD-10-CM | POA: Diagnosis not present

## 2022-03-12 NOTE — Progress Notes (Deleted)
Cardiology Office Note Date:  03/12/2022  Patient ID:  [name] [name], [name] [name] 1934/07/18, MRN [id number] PCP:  Lajean Manes, MD  Electrophysiologist: Dr. Lovena Le     Chief Complaint: recurrent syncope  History of Present Illness: [name] [name] is a 88 y.o. female with history of DM, HTN, HLD, CKD (III), hypothyroidism, Chronic back pain, PVD, TIA (?? This is an unclear diagnosis/history by family's report)  > loop implant, dementia, dermatomyositis   She has not been in the office before, loop implanted during a hospitalization with cryptogenic stroke and had loop implanted 2021.  Seeing neurology with progressive weakness to the point of needing help with ADLs, eventually requiring max assist, able help with transfers, can stand to do minimal tasks.  Feb 2023, admitted with syncope, hypoglycemic, hyponatremic Family gave CPR, EMS on arrival had pulse Apparently had a syncopal event with EMS< though I can not track down any cardiac/rhythm/rate data  She was admitted 01/13/22 - 01/20/22 with recurrent syncope. Treated for: Aspiration pneumonia, perhaps etiology of her syncope (?) Loop interrogated though not programmed for brady detection, reportedly no AFib noted Oral thrush (old dx) HTN/tachycardia and started on metoprolol Dermatomyositis (old dx)  Recommended it seem to f/u here to have her loop programmed appropriately now with syncope history  I saw her 01/30/22 She comes today with her two daughters that she lives with. The daughters work from home and are helping with her care. They deny the hx of stroke, report that she has never been found to for sure have had a stroke (in further review was a TIA that prompted the loop) She has been suffering with recurrent episodes of syncope. The most recent she had just finished on the commode, urinated and BM, during the process of finishing up, cleaning up, re-dressing she had sudden onset of stiffening/perhaps shaking arms and  became lethargic, very difficult to arouse,  perhaps lasting 10 minutes Another was on as she was being transferred to the toilet or just about to her daughter noticed she had a sweaty brow and then became lethargic, loss of tone in her neck, head tilted back, eyes seemed to roll back, moaning,  These are not new events it seems, but are happening more frequently The only thing that seems the same is she feels hot 1st No other symptoms that she/they have noticed. She denies any CP, palpitations, SOB Loop: There are 7 logged (old ) episodes as Af, EGMs reviewed are ST/PACs, no true AFib None of the tachy episodes correlate with any hospitalizations Tachy/brady were turned on remotely (after her last hospitalization) I made these more sensitive at least temporarily with brady rate of 40 and tachy at 158 They were also provided symptom activator  Family has provided BP logs, lowish DBP 40's-50's, seems they have made some symptom episodes, but none were found Amlodipine was stopped  02/19/22, device clinic visit, to ensure programming and test symptom activator that worked.  *** symptoms since device clinic visit *** symptom episodes? *** BP off amlodipine   Device information MDT LINQ II, implanted 08/23/2019   Past Medical History:  Diagnosis Date   Arthritis    "knees, legs" (06/10/2016)   Ascites    Chronic kidney disease    "related to my diabetes"   Chronic lower back pain    Diabetes mellitus without complication (HCC)    Eczema    GERD (gastroesophageal reflux disease)    High cholesterol    Hyperlipidemia    Hypertension  Hypothyroidism    OAB (overactive bladder)    Osteoporosis    Thyroid disease    Type II diabetes mellitus ([place])    Urticaria     Past Surgical History:  Procedure Laterality Date   ABDOMINAL HYSTERECTOMY     KNEE ARTHROSCOPY     LAPAROTOMY N/A 12/03/2016   Procedure: EXPLORATORY LAPAROTOMY, LYSIS OF ADHESIONS;  Surgeon: Armandina Gemma, MD;   Location: [facility];  Service: General;  Laterality: N/A;   LOOP RECORDER INSERTION N/A 08/23/2019   Procedure: LOOP RECORDER INSERTION;  Surgeon: Evans Lance, MD;  Location: [facility];  Service: Cardiovascular;  Laterality: N/A;   vocal cord polyps      Current Outpatient Medications  Medication Sig Dispense Refill   acetaminophen (TYLENOL) 650 MG CR tablet Take 1,300 mg by mouth at bedtime as needed for pain.     B-D UF III MINI PEN NEEDLES 31G X 5 MM MISC USE AS DIRECTED TID     BAYER LOW DOSE 81 MG EC tablet Take 81 mg by mouth daily. Swallow whole.     cetirizine (ZYRTEC) 10 MG tablet Take 10 mg by mouth daily as needed for allergies.     Cholecalciferol (VITAMIN D-3 PO) Take 10,000 Units by mouth daily.     docusate sodium (COLACE) 100 MG capsule Take 100 mg by mouth daily as needed for mild constipation.     Famotidine (PEPCID AC PO) Take 1 tablet by mouth at bedtime.     feeding supplement, GLUCERNA SHAKE, (GLUCERNA SHAKE) LIQD Take 237 mLs by mouth 3 (three) times daily between meals. 10000 mL 0   fluconazole (DIFLUCAN) 200 MG tablet Take 200 mg by mouth 2 (two) times daily.     folic acid (FOLVITE) 1 MG tablet Take 1 mg by mouth every evening.     insulin aspart (NOVOLOG FLEXPEN) 100 UNIT/ML FlexPen Inject 2-10 Units into the skin See admin instructions. Inject 2-10 units into the skin three times a day with meals, per sliding scale:  Breakfast: BGL 80-199 = 8 units; 200-299 = 9 units; 300 or greater = 10 units Lunch: BGL 80-199 = 5 units; 200-299 = 6 units; 300 or greater = 7 units Supper/evening meal: BGL 80-199 = 2 units; 200-299 = 3 units; 300 or greater = 4 units     insulin detemir (LEVEMIR FLEXTOUCH) 100 UNIT/ML FlexPen Inject 3-7 Units into the skin See admin instructions. Inject 7 units into the skin in the morning and 3 units at bedtime if BGL is 200 or below and 4 units if BGL is greater than 200     Iron-FA-B Cmp-C-Biot-Probiotic (FUSION PLUS) CAPS Take 1 capsule  by mouth daily with breakfast.     levETIRAcetam (KEPPRA) 500 MG tablet Take 1 tablet (500 mg total) by mouth 2 (two) times daily. 60 tablet 2   liver oil-zinc oxide (DESITIN) 40 % ointment Apply topically 3 (three) times daily. 56.7 g 0   losartan (COZAAR) 50 MG tablet Take 50 mg by mouth daily.     methotrexate (RHEUMATREX) 2.5 MG tablet Take 1 tablet (2.5 mg total) by mouth once a week. Caution:Chemotherapy. Protect from light. (Patient not taking: Reported on 01/30/2022) 4 tablet 0   metoprolol tartrate (LOPRESSOR) 25 MG tablet Take 1 tablet (25 mg total) by mouth 2 (two) times daily. 60 tablet 3   Multiple Vitamins-Calcium (ONE-A-DAY WOMENS FORMULA) TABS Take 1 tablet by mouth daily.     nystatin (MYCOSTATIN) 100000 UNIT/ML suspension Take  1 mL by mouth 4 (four) times daily.     omega-3 acid ethyl esters (LOVAZA) 1 g capsule Take 1 g by mouth 2 (two) times daily.     OneTouch Delica Lancets 49Q MISC IC-10 CODE:  E11.65  Check blood sugar     ONETOUCH VERIO test strip 1 each 3 (three) times daily.     polyethylene glycol (MIRALAX / GLYCOLAX) 17 g packet Take 17 g by mouth daily. 14 each 0   polyethylene glycol (MIRALAX / GLYCOLAX) 17 g packet Take 17 g by mouth as needed for mild constipation or moderate constipation.     predniSONE (DELTASONE) 1 MG tablet Take 12 tablets (12 mg total) by mouth daily with breakfast. 30 tablet 1   SYNTHROID 200 MCG tablet Take 200 mcg by mouth daily before breakfast.     No current facility-administered medications for this visit.    Allergies:   Nsaids, Biaxin [clarithromycin], Sulfa antibiotics, Bactrim [sulfamethoxazole-trimethoprim], and Lisinopril   Social History:  The patient  reports that she has never smoked. She quit smokeless tobacco use about 38 years ago.  Her smokeless tobacco use included chew. She reports that she does not drink alcohol and does not use drugs.   Family History:  The patient's family history includes Asthma in her father;  Diabetes in her brother, mother, and sister; Hypertension in her mother; Stomach cancer in her sister.  ROS:  Please see the history of present illness.    All other systems are reviewed and otherwise negative.   PHYSICAL EXAM:  VS:  There were no vitals taken for this visit. BMI: There is no height or weight on file to calculate BMI. Well nourished, well developed, in no acute distress HEENT: normocephalic, atraumatic Neck: no JVD, carotid bruits or masses Cardiac:  *** RRR; no significant murmurs, no rubs, or gallops Lungs: *** CTA b/l, no wheezing, rhonchi or rales Abd: soft, nontender MS: no deformity, advanced atrophy Ext: *** 1+ edema (reported as chronic) Skin: warm and dry, no rash Neuro:  No gross deficits appreciated Psych: euthymic mood, full affect  *** ILR site is stable, no tethering or discomfort   EKG:  not done today  Device interrogation done today and reviewed by myself:  ***  01/14/2022: TTE  1. Left ventricular ejection fraction, by estimation, is 60 to 65%. The  left ventricle has normal function. The left ventricle has no regional  wall motion abnormalities. Left ventricular diastolic parameters are  consistent with Grade I diastolic  dysfunction (impaired relaxation).   2. Right ventricular systolic function is normal. The right ventricular  size is normal.   3. The mitral valve is degenerative. No evidence of mitral valve  regurgitation. No evidence of mitral stenosis.   4. The aortic valve is normal in structure. Aortic valve regurgitation is  not visualized. No aortic stenosis is present.   5. The inferior vena cava is normal in size with greater than 50%  respiratory variability, suggesting right atrial pressure of 3 mmHg.   Recent Labs: 09/13/2021: TSH 1.566 01/16/2022: ALT 29; BUN 13; Hemoglobin 9.7; Platelets 225; Potassium 3.8; Sodium 133 01/19/2022: Creatinine, Ser 0.60  No results found for requested labs within last 365 days.   CrCl cannot  be calculated (Patient's most recent lab result is older than the maximum 21 days allowed.).   Wt Readings from Last 3 Encounters:  01/30/22 96 lb (43.5 kg)  01/20/22 122 lb 9.2 oz (55.6 kg)  09/13/21 114 lb 3.2 oz (  51.8 kg)     Other studies reviewed: Additional studies/records reviewed today include: summarized above  ASSESSMENT AND PLAN:  Cryptogenic TIA ILR *** No AF to date  Recurrent syncope ***    Disposition: ***  Current medicines are reviewed at length with the patient today.  The patient did not have any concerns regarding medicines.  Venetia Night, PA-C 03/12/2022 6:15 PM     [address] [place]  [address] [phone] (office)  [phone] (fax)

## 2022-03-13 ENCOUNTER — Encounter: Payer: Medicare HMO | Admitting: Physician Assistant

## 2022-03-20 DIAGNOSIS — L97522 Non-pressure chronic ulcer of other part of left foot with fat layer exposed: Secondary | ICD-10-CM | POA: Diagnosis not present

## 2022-03-22 DIAGNOSIS — Z993 Dependence on wheelchair: Secondary | ICD-10-CM | POA: Diagnosis not present

## 2022-03-22 DIAGNOSIS — R54 Age-related physical debility: Secondary | ICD-10-CM | POA: Diagnosis not present

## 2022-03-24 DIAGNOSIS — E1165 Type 2 diabetes mellitus with hyperglycemia: Secondary | ICD-10-CM | POA: Diagnosis not present

## 2022-03-24 DIAGNOSIS — Z794 Long term (current) use of insulin: Secondary | ICD-10-CM | POA: Diagnosis not present

## 2022-03-24 DIAGNOSIS — E1140 Type 2 diabetes mellitus with diabetic neuropathy, unspecified: Secondary | ICD-10-CM | POA: Diagnosis not present

## 2022-03-24 DIAGNOSIS — E119 Type 2 diabetes mellitus without complications: Secondary | ICD-10-CM | POA: Diagnosis not present

## 2022-03-25 DIAGNOSIS — Z794 Long term (current) use of insulin: Secondary | ICD-10-CM | POA: Diagnosis not present

## 2022-03-25 DIAGNOSIS — E1140 Type 2 diabetes mellitus with diabetic neuropathy, unspecified: Secondary | ICD-10-CM | POA: Diagnosis not present

## 2022-03-25 DIAGNOSIS — E1165 Type 2 diabetes mellitus with hyperglycemia: Secondary | ICD-10-CM | POA: Diagnosis not present

## 2022-03-25 DIAGNOSIS — E119 Type 2 diabetes mellitus without complications: Secondary | ICD-10-CM | POA: Diagnosis not present

## 2022-03-26 DIAGNOSIS — Z794 Long term (current) use of insulin: Secondary | ICD-10-CM | POA: Diagnosis not present

## 2022-03-26 DIAGNOSIS — E1165 Type 2 diabetes mellitus with hyperglycemia: Secondary | ICD-10-CM | POA: Diagnosis not present

## 2022-03-26 DIAGNOSIS — E1140 Type 2 diabetes mellitus with diabetic neuropathy, unspecified: Secondary | ICD-10-CM | POA: Diagnosis not present

## 2022-03-26 DIAGNOSIS — E119 Type 2 diabetes mellitus without complications: Secondary | ICD-10-CM | POA: Diagnosis not present

## 2022-03-27 DIAGNOSIS — E119 Type 2 diabetes mellitus without complications: Secondary | ICD-10-CM | POA: Diagnosis not present

## 2022-03-27 DIAGNOSIS — E1165 Type 2 diabetes mellitus with hyperglycemia: Secondary | ICD-10-CM | POA: Diagnosis not present

## 2022-03-27 DIAGNOSIS — R197 Diarrhea, unspecified: Secondary | ICD-10-CM | POA: Diagnosis not present

## 2022-03-27 DIAGNOSIS — Z794 Long term (current) use of insulin: Secondary | ICD-10-CM | POA: Diagnosis not present

## 2022-03-27 DIAGNOSIS — E1140 Type 2 diabetes mellitus with diabetic neuropathy, unspecified: Secondary | ICD-10-CM | POA: Diagnosis not present

## 2022-03-28 DIAGNOSIS — E1140 Type 2 diabetes mellitus with diabetic neuropathy, unspecified: Secondary | ICD-10-CM | POA: Diagnosis not present

## 2022-03-28 DIAGNOSIS — Z794 Long term (current) use of insulin: Secondary | ICD-10-CM | POA: Diagnosis not present

## 2022-03-28 DIAGNOSIS — E1165 Type 2 diabetes mellitus with hyperglycemia: Secondary | ICD-10-CM | POA: Diagnosis not present

## 2022-03-28 DIAGNOSIS — E119 Type 2 diabetes mellitus without complications: Secondary | ICD-10-CM | POA: Diagnosis not present

## 2022-03-29 DIAGNOSIS — E1140 Type 2 diabetes mellitus with diabetic neuropathy, unspecified: Secondary | ICD-10-CM | POA: Diagnosis not present

## 2022-03-29 DIAGNOSIS — Z794 Long term (current) use of insulin: Secondary | ICD-10-CM | POA: Diagnosis not present

## 2022-03-29 DIAGNOSIS — E1165 Type 2 diabetes mellitus with hyperglycemia: Secondary | ICD-10-CM | POA: Diagnosis not present

## 2022-03-29 DIAGNOSIS — E119 Type 2 diabetes mellitus without complications: Secondary | ICD-10-CM | POA: Diagnosis not present

## 2022-03-30 DIAGNOSIS — E119 Type 2 diabetes mellitus without complications: Secondary | ICD-10-CM | POA: Diagnosis not present

## 2022-03-30 DIAGNOSIS — Z794 Long term (current) use of insulin: Secondary | ICD-10-CM | POA: Diagnosis not present

## 2022-03-30 DIAGNOSIS — E1165 Type 2 diabetes mellitus with hyperglycemia: Secondary | ICD-10-CM | POA: Diagnosis not present

## 2022-03-30 DIAGNOSIS — E1140 Type 2 diabetes mellitus with diabetic neuropathy, unspecified: Secondary | ICD-10-CM | POA: Diagnosis not present

## 2022-03-31 DIAGNOSIS — E1140 Type 2 diabetes mellitus with diabetic neuropathy, unspecified: Secondary | ICD-10-CM | POA: Diagnosis not present

## 2022-03-31 DIAGNOSIS — E1165 Type 2 diabetes mellitus with hyperglycemia: Secondary | ICD-10-CM | POA: Diagnosis not present

## 2022-03-31 DIAGNOSIS — Z794 Long term (current) use of insulin: Secondary | ICD-10-CM | POA: Diagnosis not present

## 2022-03-31 DIAGNOSIS — E119 Type 2 diabetes mellitus without complications: Secondary | ICD-10-CM | POA: Diagnosis not present

## 2022-04-03 ENCOUNTER — Other Ambulatory Visit: Payer: Medicare HMO

## 2022-04-03 ENCOUNTER — Telehealth: Payer: Medicare HMO | Admitting: Nurse Practitioner

## 2022-04-03 ENCOUNTER — Encounter: Payer: Self-pay | Admitting: Nurse Practitioner

## 2022-04-03 VITALS — BP 132/82 | HR 54 | Temp 97.4°F

## 2022-04-03 DIAGNOSIS — Z515 Encounter for palliative care: Secondary | ICD-10-CM

## 2022-04-03 DIAGNOSIS — Z7189 Other specified counseling: Secondary | ICD-10-CM

## 2022-04-03 DIAGNOSIS — R634 Abnormal weight loss: Secondary | ICD-10-CM | POA: Diagnosis not present

## 2022-04-03 DIAGNOSIS — R5381 Other malaise: Secondary | ICD-10-CM

## 2022-04-03 DIAGNOSIS — L97522 Non-pressure chronic ulcer of other part of left foot with fat layer exposed: Secondary | ICD-10-CM | POA: Diagnosis not present

## 2022-04-03 DIAGNOSIS — R630 Anorexia: Secondary | ICD-10-CM

## 2022-04-03 NOTE — Progress Notes (Signed)
[facility] Consult Note Telephone: [phone]  Fax: [phone]    Date of encounter: 04/03/22 2:49 PM PATIENT NAME: [name] [name] [address] [place] [address]   [phone] (home)  DOB: 04-21-1934 MRN: [id number] PRIMARY CARE PROVIDER:    Lajean Manes, MD,  [address] [place]  [address] [phone]  RESPONSIBLE PARTY:    Contact Information     Name Relation Home Work [place] Daughter   [phone]   Nickola Major Daughter [phone]        Due to the COVID-19 crisis, this visit was done via telemedicine from my office and it was initiated and consent by this patient and or family.  I connected with [name] [name] PC RN with daughter, [name] [name] and  [name] [name] [name] on 04/03/22 by a video enabled telemedicine application and verified that I am speaking with the correct person using two identifiers.   I discussed the limitations of evaluation and management by telemedicine. The patient expressed understanding and agreed to proceed. Palliative Care was asked to follow this patient by consultation request of  [name] Manes, MD to address advance care planning and complex medical decision making. This is a follow up visit.                                  ASSESSMENT AND PLAN / RECOMMENDATIONS:  Advance Care Planning/Goals of Care: Goals include to maximize quality of life and symptom management. Patient/health care surrogate gave his/her permission to discuss. Our advance care planning conversation included a discussion about:    The value and importance of advance care planning  Experiences with loved ones who have been seriously ill or have died  Exploration of personal, cultural or spiritual beliefs that might influence medical decisions  Exploration of goals of care in the event of a sudden injury or illness  Identification of a healthcare agent  Review and  updating or creation of an  advance directive document . Decision not to resuscitate or to de-escalate disease focused treatments due to poor prognosis. CODE STATUS: DNR Symptom Management/Plan: 1. Advance Care Planning;  reviewed medical goals, wishes are to be DNR, golden rod form completed, will put in vynca. Ongoing discussions of goc.   2. Goals of Care: Goals include to maximize quality of life and symptom management. Our advance care planning conversation included a discussion about:    The value and importance of advance care planning  Exploration of personal, cultural or spiritual beliefs that might influence medical decisions  Exploration of goals of care in the event of a sudden injury or illness  Identification and preparation of a healthcare agent  Review and updating or creation of an advance directive document.   3. Debility/generalized weakness secondary to deconditioning, discussed monitoring activity level, encourage mobility, talked about transfers. Fall risk.   4. Anorexia with weight loss; continue to monitor appetite.  06/03/2021 weight 125 lbs 01/30/2022 weight 96 lbs 5. Palliative care encounter; Palliative care encounter; Palliative medicine team will continue to support patient, patient's family, and medical team. Visit consisted of counseling and education dealing with the complex and emotionally intense issues of symptom management and palliative care in the setting of serious and potentially life-threatening illness   Follow up Palliative Care Visit: Palliative care will continue to follow for complex medical decision making, advance care planning, and clarification  of goals. Return 8 weeks prn   I spent 31 minutes providing this consultation. More than 50% of the time in this consultation was spent in counseling and care coordination. PPS: 40% Chief Complaint: Initial consult palliative consult for complex medical decision making, address goals, manage ongoing  symptoms   HISTORY OF PRESENT ILLNESS:  [name] [name] is a 88 y.o. year old female  with multiple medical problems including HTN, DM, PAD, dermatomyositis, pacer, hypothyroidism, gerd, hypothyroidism. Hospitalized [birth year]87 yo 01/20/2022 for syncope episode, with aspiration pna, elevated troponin, fever, sepsis, dermatopolymyosis. I connected with [name] [name] PC RN with daughter, [name] [name] and [name] [name], discussed PC visit. We reviewed ROS, weights, appetite, current functional abilities, goc, code status and wishes are to be DNR, golden rod completed. Medical goals reviewed. Talked about role pc in poc. [name] [name] endorses overall doing well with [name] [name] in agreement, Therapeutic listening, emotional support provided. Questions answered. PC follow up visit scheduled.  [name] [name] in agreement, scheduled. Therapeutic listening, emotional support provided. Questions answered.   History obtained from review of EMR, discussion with daughter, [name] [name] [facility] RN with [name] [name].  I reviewed available labs, medications, imaging, studies and related documents from the EMR.  Records reviewed and summarized above.   ROS 10 point reviewed all negative except HPI  Physical Exam: deferred Thank you for the opportunity to participate in the care of [name].  The palliative care team will continue to follow. Please call our office at [phone] if we can be of additional assistance.   [name] [name] [name] Gully, NP

## 2022-04-03 NOTE — Progress Notes (Signed)
PATIENT NAME: [name] [name] DOB: [birth year] MRN: [id number]  PRIMARY CARE PROVIDER: Lajean Manes, MD  RESPONSIBLE PARTY:  Acct ID - Guarantor Home Phone Work Phone Relationship Acct Type  [id number] - Hafner,BESS* [phone]  Self P/F     [address], [place], [place] [address]   ACP:  Discussion completed with patient and daughter [name].  Patient advised she would not resuscitation and is requesting a DNR.  Connected virtually with Christin Gusler, NP to complete DNR form. Will send blank MOST form for patient and family to review.   DM:  Blood sugar checks are done routinely and ranges from 120-222.   Diarrhea:  Daughter [name] and patient endorse ongoing issues with diarrhea.  They have tried the [employer] and did not notice a significant change.  Stool testing was completed and results are negative per daughter.  Uncertain what testing was completed though.  Family noticed changed when Glucerna was tried.  They will try holding this to see if any difference is noted.  Discussed further evaluation with GI if needed.     CODE STATUS: Desires DNR status ADVANCED DIRECTIVES: Yes but not on file. MOST FORM: No PPS: 40%   PHYSICAL EXAM:   VITALS: Today's Vitals   04/03/22 1301  BP: 132/82  Pulse: (!) 54  Temp: (!) 97.4 F (36.3 C)  SpO2: 92%    LUNGS: clear to auscultation  CARDIAC: Cor RRR}  EXTREMITIES: - for edema SKIN: Skin color, texture, turgor normal. No rashes or lesions or normal  NEURO: positive for gait problems       Lorenza Burton, RN

## 2022-04-06 ENCOUNTER — Ambulatory Visit (INDEPENDENT_AMBULATORY_CARE_PROVIDER_SITE_OTHER): Payer: Medicare HMO

## 2022-04-06 DIAGNOSIS — R55 Syncope and collapse: Secondary | ICD-10-CM | POA: Diagnosis not present

## 2022-04-07 LAB — CUP PACEART REMOTE DEVICE CHECK
Date Time Interrogation Session: 20230917230201
Implantable Pulse Generator Implant Date: 20210203

## 2022-04-17 DIAGNOSIS — L97522 Non-pressure chronic ulcer of other part of left foot with fat layer exposed: Secondary | ICD-10-CM | POA: Diagnosis not present

## 2022-04-18 NOTE — Progress Notes (Signed)
Carelink Summary Report / Loop Recorder 

## 2022-04-20 DIAGNOSIS — B370 Candidal stomatitis: Secondary | ICD-10-CM | POA: Diagnosis not present

## 2022-04-20 DIAGNOSIS — M3390 Dermatopolymyositis, unspecified, organ involvement unspecified: Secondary | ICD-10-CM | POA: Diagnosis not present

## 2022-04-20 DIAGNOSIS — L0100 Impetigo, unspecified: Secondary | ICD-10-CM | POA: Diagnosis not present

## 2022-04-20 DIAGNOSIS — K529 Noninfective gastroenteritis and colitis, unspecified: Secondary | ICD-10-CM | POA: Diagnosis not present

## 2022-04-20 DIAGNOSIS — R300 Dysuria: Secondary | ICD-10-CM | POA: Diagnosis not present

## 2022-04-20 DIAGNOSIS — Z993 Dependence on wheelchair: Secondary | ICD-10-CM | POA: Diagnosis not present

## 2022-04-21 DIAGNOSIS — Z993 Dependence on wheelchair: Secondary | ICD-10-CM | POA: Diagnosis not present

## 2022-04-21 DIAGNOSIS — R54 Age-related physical debility: Secondary | ICD-10-CM | POA: Diagnosis not present

## 2022-04-23 DIAGNOSIS — M3390 Dermatopolymyositis, unspecified, organ involvement unspecified: Secondary | ICD-10-CM | POA: Diagnosis not present

## 2022-04-23 DIAGNOSIS — L0889 Other specified local infections of the skin and subcutaneous tissue: Secondary | ICD-10-CM | POA: Diagnosis not present

## 2022-04-30 DIAGNOSIS — Z7984 Long term (current) use of oral hypoglycemic drugs: Secondary | ICD-10-CM | POA: Diagnosis not present

## 2022-04-30 DIAGNOSIS — H524 Presbyopia: Secondary | ICD-10-CM | POA: Diagnosis not present

## 2022-04-30 DIAGNOSIS — E119 Type 2 diabetes mellitus without complications: Secondary | ICD-10-CM | POA: Diagnosis not present

## 2022-04-30 DIAGNOSIS — Z794 Long term (current) use of insulin: Secondary | ICD-10-CM | POA: Diagnosis not present

## 2022-04-30 DIAGNOSIS — Z961 Presence of intraocular lens: Secondary | ICD-10-CM | POA: Diagnosis not present

## 2022-05-01 DIAGNOSIS — L97522 Non-pressure chronic ulcer of other part of left foot with fat layer exposed: Secondary | ICD-10-CM | POA: Diagnosis not present

## 2022-05-05 ENCOUNTER — Telehealth: Payer: Medicare HMO | Admitting: Nurse Practitioner

## 2022-05-07 DIAGNOSIS — Z79899 Other long term (current) drug therapy: Secondary | ICD-10-CM | POA: Diagnosis not present

## 2022-05-07 DIAGNOSIS — M3390 Dermatopolymyositis, unspecified, organ involvement unspecified: Secondary | ICD-10-CM | POA: Diagnosis not present

## 2022-05-07 DIAGNOSIS — M1991 Primary osteoarthritis, unspecified site: Secondary | ICD-10-CM | POA: Diagnosis not present

## 2022-05-07 DIAGNOSIS — M4802 Spinal stenosis, cervical region: Secondary | ICD-10-CM | POA: Diagnosis not present

## 2022-05-07 DIAGNOSIS — R21 Rash and other nonspecific skin eruption: Secondary | ICD-10-CM | POA: Diagnosis not present

## 2022-05-09 ENCOUNTER — Emergency Department (HOSPITAL_COMMUNITY): Payer: Medicare HMO

## 2022-05-09 ENCOUNTER — Encounter (HOSPITAL_COMMUNITY): Payer: Self-pay

## 2022-05-09 ENCOUNTER — Inpatient Hospital Stay (HOSPITAL_COMMUNITY)
Admission: EM | Admit: 2022-05-09 | Discharge: 2022-05-13 | DRG: 074 | Disposition: A | Discharge status: Home / Self Care | Payer: Medicare HMO | Attending: Internal Medicine | Admitting: Internal Medicine

## 2022-05-09 ENCOUNTER — Other Ambulatory Visit: Payer: Self-pay

## 2022-05-09 DIAGNOSIS — I10 Essential (primary) hypertension: Secondary | ICD-10-CM | POA: Diagnosis present

## 2022-05-09 DIAGNOSIS — R059 Cough, unspecified: Secondary | ICD-10-CM | POA: Diagnosis not present

## 2022-05-09 DIAGNOSIS — Z886 Allergy status to analgesic agent status: Secondary | ICD-10-CM

## 2022-05-09 DIAGNOSIS — E440 Moderate protein-calorie malnutrition: Secondary | ICD-10-CM | POA: Insufficient documentation

## 2022-05-09 DIAGNOSIS — E872 Acidosis, unspecified: Secondary | ICD-10-CM | POA: Diagnosis present

## 2022-05-09 DIAGNOSIS — E7800 Pure hypercholesterolemia, unspecified: Secondary | ICD-10-CM | POA: Diagnosis present

## 2022-05-09 DIAGNOSIS — Z800 Family history of malignant neoplasm of digestive organs: Secondary | ICD-10-CM

## 2022-05-09 DIAGNOSIS — E1165 Type 2 diabetes mellitus with hyperglycemia: Secondary | ICD-10-CM | POA: Diagnosis present

## 2022-05-09 DIAGNOSIS — Z8249 Family history of ischemic heart disease and other diseases of the circulatory system: Secondary | ICD-10-CM

## 2022-05-09 DIAGNOSIS — Z882 Allergy status to sulfonamides status: Secondary | ICD-10-CM

## 2022-05-09 DIAGNOSIS — E869 Volume depletion, unspecified: Secondary | ICD-10-CM | POA: Diagnosis present

## 2022-05-09 DIAGNOSIS — R946 Abnormal results of thyroid function studies: Secondary | ICD-10-CM | POA: Diagnosis not present

## 2022-05-09 DIAGNOSIS — Z8673 Personal history of transient ischemic attack (TIA), and cerebral infarction without residual deficits: Secondary | ICD-10-CM

## 2022-05-09 DIAGNOSIS — Z881 Allergy status to other antibiotic agents status: Secondary | ICD-10-CM

## 2022-05-09 DIAGNOSIS — G909 Disorder of the autonomic nervous system, unspecified: Principal | ICD-10-CM | POA: Diagnosis present

## 2022-05-09 DIAGNOSIS — R7989 Other specified abnormal findings of blood chemistry: Secondary | ICD-10-CM

## 2022-05-09 DIAGNOSIS — Z833 Family history of diabetes mellitus: Secondary | ICD-10-CM

## 2022-05-09 DIAGNOSIS — R627 Adult failure to thrive: Secondary | ICD-10-CM | POA: Diagnosis present

## 2022-05-09 DIAGNOSIS — L89152 Pressure ulcer of sacral region, stage 2: Secondary | ICD-10-CM | POA: Diagnosis present

## 2022-05-09 DIAGNOSIS — Z743 Need for continuous supervision: Secondary | ICD-10-CM | POA: Diagnosis not present

## 2022-05-09 DIAGNOSIS — M810 Age-related osteoporosis without current pathological fracture: Secondary | ICD-10-CM | POA: Diagnosis present

## 2022-05-09 DIAGNOSIS — R64 Cachexia: Secondary | ICD-10-CM | POA: Diagnosis present

## 2022-05-09 DIAGNOSIS — D509 Iron deficiency anemia, unspecified: Secondary | ICD-10-CM | POA: Diagnosis present

## 2022-05-09 DIAGNOSIS — R4182 Altered mental status, unspecified: Secondary | ICD-10-CM | POA: Diagnosis not present

## 2022-05-09 DIAGNOSIS — Z1152 Encounter for screening for COVID-19: Secondary | ICD-10-CM

## 2022-05-09 DIAGNOSIS — Z4509 Encounter for adjustment and management of other cardiac device: Secondary | ICD-10-CM

## 2022-05-09 DIAGNOSIS — R1310 Dysphagia, unspecified: Secondary | ICD-10-CM | POA: Diagnosis present

## 2022-05-09 DIAGNOSIS — Z79890 Hormone replacement therapy (postmenopausal): Secondary | ICD-10-CM

## 2022-05-09 DIAGNOSIS — E039 Hypothyroidism, unspecified: Secondary | ICD-10-CM | POA: Diagnosis present

## 2022-05-09 DIAGNOSIS — N179 Acute kidney failure, unspecified: Secondary | ICD-10-CM | POA: Diagnosis not present

## 2022-05-09 DIAGNOSIS — I700 Atherosclerosis of aorta: Secondary | ICD-10-CM | POA: Diagnosis not present

## 2022-05-09 DIAGNOSIS — Z7952 Long term (current) use of systemic steroids: Secondary | ICD-10-CM

## 2022-05-09 DIAGNOSIS — Z825 Family history of asthma and other chronic lower respiratory diseases: Secondary | ICD-10-CM

## 2022-05-09 DIAGNOSIS — Z681 Body mass index (BMI) 19 or less, adult: Secondary | ICD-10-CM

## 2022-05-09 DIAGNOSIS — Z79899 Other long term (current) drug therapy: Secondary | ICD-10-CM

## 2022-05-09 DIAGNOSIS — M3313 Other dermatomyositis without myopathy: Secondary | ICD-10-CM | POA: Diagnosis present

## 2022-05-09 DIAGNOSIS — R231 Pallor: Secondary | ICD-10-CM | POA: Diagnosis not present

## 2022-05-09 DIAGNOSIS — G8929 Other chronic pain: Secondary | ICD-10-CM | POA: Diagnosis present

## 2022-05-09 DIAGNOSIS — R55 Syncope and collapse: Secondary | ICD-10-CM | POA: Diagnosis not present

## 2022-05-09 DIAGNOSIS — M549 Dorsalgia, unspecified: Secondary | ICD-10-CM | POA: Diagnosis present

## 2022-05-09 DIAGNOSIS — E1143 Type 2 diabetes mellitus with diabetic autonomic (poly)neuropathy: Secondary | ICD-10-CM | POA: Diagnosis present

## 2022-05-09 DIAGNOSIS — Z888 Allergy status to other drugs, medicaments and biological substances status: Secondary | ICD-10-CM

## 2022-05-09 DIAGNOSIS — F0390 Unspecified dementia without behavioral disturbance: Secondary | ICD-10-CM | POA: Diagnosis present

## 2022-05-09 DIAGNOSIS — Z95818 Presence of other cardiac implants and grafts: Secondary | ICD-10-CM

## 2022-05-09 DIAGNOSIS — K219 Gastro-esophageal reflux disease without esophagitis: Secondary | ICD-10-CM | POA: Diagnosis present

## 2022-05-09 DIAGNOSIS — Z8701 Personal history of pneumonia (recurrent): Secondary | ICD-10-CM

## 2022-05-09 DIAGNOSIS — R739 Hyperglycemia, unspecified: Secondary | ICD-10-CM | POA: Diagnosis not present

## 2022-05-09 DIAGNOSIS — Z515 Encounter for palliative care: Secondary | ICD-10-CM

## 2022-05-09 DIAGNOSIS — Z794 Long term (current) use of insulin: Secondary | ICD-10-CM

## 2022-05-09 DIAGNOSIS — R1031 Right lower quadrant pain: Secondary | ICD-10-CM | POA: Diagnosis not present

## 2022-05-09 DIAGNOSIS — S41112A Laceration without foreign body of left upper arm, initial encounter: Secondary | ICD-10-CM | POA: Diagnosis present

## 2022-05-09 DIAGNOSIS — R339 Retention of urine, unspecified: Secondary | ICD-10-CM | POA: Diagnosis present

## 2022-05-09 DIAGNOSIS — Z79620 Long term (current) use of immunosuppressive biologic: Secondary | ICD-10-CM

## 2022-05-09 DIAGNOSIS — R29810 Facial weakness: Secondary | ICD-10-CM | POA: Diagnosis present

## 2022-05-09 DIAGNOSIS — Z87890 Personal history of sex reassignment: Secondary | ICD-10-CM

## 2022-05-09 DIAGNOSIS — W19XXXA Unspecified fall, initial encounter: Secondary | ICD-10-CM | POA: Diagnosis present

## 2022-05-09 LAB — CBC WITH DIFFERENTIAL/PLATELET
Abs Immature Granulocytes: 0.07 10*3/uL (ref 0.00–0.07)
Basophils Absolute: 0 10*3/uL (ref 0.0–0.1)
Basophils Relative: 0 %
Eosinophils Absolute: 0.2 10*3/uL (ref 0.0–0.5)
Eosinophils Relative: 2 %
HCT: 34.1 % — ABNORMAL LOW (ref 36.0–46.0)
Hemoglobin: 11.1 g/dL — ABNORMAL LOW (ref 12.0–15.0)
Immature Granulocytes: 1 %
Lymphocytes Relative: 5 %
Lymphs Abs: 0.3 10*3/uL — ABNORMAL LOW (ref 0.7–4.0)
MCH: 31.3 pg (ref 26.0–34.0)
MCHC: 32.6 g/dL (ref 30.0–36.0)
MCV: 96.1 fL (ref 80.0–100.0)
Monocytes Absolute: 0.4 10*3/uL (ref 0.1–1.0)
Monocytes Relative: 5 %
Neutro Abs: 5.9 10*3/uL (ref 1.7–7.7)
Neutrophils Relative %: 87 %
Platelets: 191 10*3/uL (ref 150–400)
RBC: 3.55 MIL/uL — ABNORMAL LOW (ref 3.87–5.11)
RDW: 13.4 % (ref 11.5–15.5)
WBC: 6.8 10*3/uL (ref 4.0–10.5)
nRBC: 0 % (ref 0.0–0.2)

## 2022-05-09 LAB — URINALYSIS, ROUTINE W REFLEX MICROSCOPIC
Bilirubin Urine: NEGATIVE
Glucose, UA: NEGATIVE mg/dL
Hgb urine dipstick: NEGATIVE
Ketones, ur: NEGATIVE mg/dL
Leukocytes,Ua: NEGATIVE
Nitrite: NEGATIVE
Protein, ur: 30 mg/dL — AB
Specific Gravity, Urine: <1.005 — ABNORMAL LOW (ref 1.005–1.030)
pH: 6 (ref 5.0–8.0)

## 2022-05-09 LAB — COMPREHENSIVE METABOLIC PANEL
ALT: 33 U/L (ref 0–44)
AST: 51 U/L — ABNORMAL HIGH (ref 15–41)
Albumin: 3.1 g/dL — ABNORMAL LOW (ref 3.5–5.0)
Alkaline Phosphatase: 74 U/L (ref 38–126)
Anion gap: 5 (ref 5–15)
BUN: 22 mg/dL (ref 8–23)
CO2: 30 mmol/L (ref 22–32)
Calcium: 9.9 mg/dL (ref 8.9–10.3)
Chloride: 101 mmol/L (ref 98–111)
Creatinine, Ser: 0.9 mg/dL (ref 0.44–1.00)
GFR, Estimated: >60 mL/min (ref >60)
Glucose, Bld: 191 mg/dL — ABNORMAL HIGH (ref 70–99)
Potassium: 3.7 mmol/L (ref 3.5–5.1)
Sodium: 136 mmol/L (ref 135–145)
Total Bilirubin: 0.4 mg/dL (ref 0.3–1.2)
Total Protein: 6.5 g/dL (ref 6.5–8.1)

## 2022-05-09 LAB — RESP PANEL BY RT-PCR (FLU A&B, COVID) ARPGX2
Influenza A by PCR: NEGATIVE
Influenza B by PCR: NEGATIVE
SARS Coronavirus 2 by RT PCR: NEGATIVE

## 2022-05-09 LAB — CBG MONITORING, ED
Glucose-Capillary: 180 mg/dL — ABNORMAL HIGH (ref 70–99)
Glucose-Capillary: 175 mg/dL — ABNORMAL HIGH (ref 70–99)

## 2022-05-09 LAB — TSH: TSH: 0.022 u[IU]/mL — ABNORMAL LOW (ref 0.350–4.500)

## 2022-05-09 LAB — LACTIC ACID, PLASMA
Lactic Acid, Venous: 3.5 mmol/L (ref 0.5–1.9)
Lactic Acid, Venous: 2.4 mmol/L (ref 0.5–1.9)

## 2022-05-09 LAB — TROPONIN I (HIGH SENSITIVITY)
Troponin I (High Sensitivity): 21 ng/L — ABNORMAL HIGH (ref <18)
Troponin I (High Sensitivity): 17 ng/L (ref <18)

## 2022-05-09 LAB — URINALYSIS, MICROSCOPIC (REFLEX)
Bacteria, UA: NONE SEEN
Squamous Epithelial / HPF: NONE SEEN (ref 0–5)

## 2022-05-09 LAB — LIPASE, BLOOD: Lipase: 119 U/L — ABNORMAL HIGH (ref 11–51)

## 2022-05-09 MED ORDER — INSULIN ASPART 100 UNIT/ML IJ SOLN
0.0000 [IU] | Freq: Every day | INTRAMUSCULAR | Status: DC
  Administered 2022-05-10: 5 [IU] via SUBCUTANEOUS
  Administered 2022-05-11: 3 [IU] via SUBCUTANEOUS
  Filled 2022-05-09: qty 0.05

## 2022-05-09 MED ORDER — IOHEXOL 300 MG/ML  SOLN
100.0000 mL | Freq: Once | INTRAMUSCULAR | Status: AC | PRN
  Administered 2022-05-09: 100 mL via INTRAVENOUS

## 2022-05-09 MED ORDER — METOPROLOL TARTRATE 25 MG PO TABS
25.0000 mg | ORAL_TABLET | Freq: Two times a day (BID) | ORAL | Status: DC
  Administered 2022-05-09 – 2022-05-13 (×8): 25 mg via ORAL
  Filled 2022-05-09 (×8): qty 1

## 2022-05-09 MED ORDER — LOSARTAN POTASSIUM 50 MG PO TABS
50.0000 mg | ORAL_TABLET | Freq: Every day | ORAL | Status: DC
  Administered 2022-05-10 – 2022-05-13 (×4): 50 mg via ORAL
  Filled 2022-05-09 (×4): qty 1

## 2022-05-09 MED ORDER — ENOXAPARIN SODIUM 30 MG/0.3ML IJ SOSY
30.0000 mg | PREFILLED_SYRINGE | INTRAMUSCULAR | Status: DC
  Administered 2022-05-09 – 2022-05-10 (×2): 30 mg via SUBCUTANEOUS
  Filled 2022-05-09 (×2): qty 0.3

## 2022-05-09 MED ORDER — HYDRALAZINE HCL 20 MG/ML IJ SOLN
5.0000 mg | Freq: Four times a day (QID) | INTRAMUSCULAR | Status: DC | PRN
  Administered 2022-05-10 – 2022-05-12 (×3): 5 mg via INTRAVENOUS
  Filled 2022-05-09 (×3): qty 1

## 2022-05-09 MED ORDER — SODIUM CHLORIDE 0.9 % IV BOLUS
1000.0000 mL | Freq: Once | INTRAVENOUS | Status: AC
  Administered 2022-05-09: 1000 mL via INTRAVENOUS

## 2022-05-09 MED ORDER — LEVOTHYROXINE SODIUM 100 MCG PO TABS
100.0000 ug | ORAL_TABLET | Freq: Every day | ORAL | Status: DC
  Administered 2022-05-10 – 2022-05-13 (×4): 100 ug via ORAL
  Filled 2022-05-09 (×4): qty 1

## 2022-05-09 MED ORDER — AMLODIPINE BESYLATE 5 MG PO TABS
2.5000 mg | ORAL_TABLET | Freq: Every day | ORAL | Status: DC
  Administered 2022-05-09 – 2022-05-13 (×5): 2.5 mg via ORAL
  Filled 2022-05-09 (×5): qty 1

## 2022-05-09 MED ORDER — LACTATED RINGERS IV BOLUS
1000.0000 mL | Freq: Once | INTRAVENOUS | Status: AC
  Administered 2022-05-09: 1000 mL via INTRAVENOUS

## 2022-05-09 MED ORDER — INSULIN ASPART 100 UNIT/ML IJ SOLN
0.0000 [IU] | Freq: Three times a day (TID) | INTRAMUSCULAR | Status: DC
  Administered 2022-05-10 (×3): 3 [IU] via SUBCUTANEOUS
  Administered 2022-05-11: 7 [IU] via SUBCUTANEOUS
  Administered 2022-05-11 (×2): 9 [IU] via SUBCUTANEOUS
  Administered 2022-05-12: 3 [IU] via SUBCUTANEOUS
  Administered 2022-05-12: 9 [IU] via SUBCUTANEOUS
  Administered 2022-05-12: 7 [IU] via SUBCUTANEOUS
  Filled 2022-05-09: qty 0.09

## 2022-05-09 NOTE — ED Notes (Signed)
Pt in CT.

## 2022-05-09 NOTE — ED Triage Notes (Signed)
Per EMS- patient is living with the daughter.  EMS reports that when they arrived , patient did not have a radial pulse, was lethargic, and drooling. Patient received NS 200 ml and reported that the patient was more awake at this time. EMS also report a history of a current staph infection to the left arm and she has a low sodium level.

## 2022-05-09 NOTE — ED Provider Notes (Signed)
[facility] DEPT Provider Note   CSN: [id number] Arrival date & time: 05/09/22  1412     History {Add pertinent medical, surgical, social history, OB history to HPI:1} Chief Complaint  Patient presents with   Loss of Consciousness    [name] [name] is a 88 y.o. female.  HPI     88yo female with history of DM, htn, hyperlipidemia, CKD, thyroid disease, loop recorder in place, dermatomyositis, who presents with concern for altered mental status/episodes of generalized weakness/near syncope.  Has had prior syncope admission 6/27-7/4 at which time she had pneumonia.  Daughter reports that her dermatomyositis has been flaring up over the last 1 to 2 weeks.  Reports that when she has these flareups she does have more difficulty swallowing thin liquids and has some aspiration.  Reports that she is concerned she did have some episodes of aspiration, and has had cough over the last week.  She has not had shortness of breath, complained of chest pain or complained of abdominal pain or urinary symptoms at home--- however here in the emergency department, does report lower suprapubic and right lower quadrant abdominal pain as well as dysuria.  Daughter reports that she had had a large bowel movement yesterday and today.  She describes 3 episodes of altered mentation.  She does not describe them as full syncopal episodes, but more as a slowing down and staring where she is slower to respond.  She had 1 episode yesterday, another episode today prior to having her morning medications, and then had a third episode today which led her to call EMS.  She did take her regular medications this morning after the initial episode.  Daughter reports that during one of the episodes she was walking with her walker, and started to go down and she was able to hold her up with her gait stability belts and prevent her from having a significant fall.  Noted she does have a left facial droop  right now, but believes that is chronic.  Denies any noted new focal weakness today, focal numbness, vision changes.  Daughter reports over the last 3 days, she has noted that towards the end of the day it seems like she has some left-sided weakness--not necessarily left arm or leg but left side.    Due to her flareup of dermatomyositis, she had increase in steroids, and has been taking more frequent Zyrtec over the last week. No other medication changes. No fevers. Eating and drinking ok. No hard falls.    Reports she has been admitted for these similar episodes. Is on keppra but has not been diagnosed with seizures.      Past Medical History:  Diagnosis Date   Arthritis    "knees, legs" (06/10/2016)   Ascites    Chronic kidney disease    "related to my diabetes"   Chronic lower back pain    Diabetes mellitus without complication (HCC)    Eczema    GERD (gastroesophageal reflux disease)    High cholesterol    Hyperlipidemia    Hypertension    Hypothyroidism    OAB (overactive bladder)    Osteoporosis    Thyroid disease    Type II diabetes mellitus ([place])    Urticaria      Home Medications Prior to Admission medications   Medication Sig Start Date End Date Taking? Authorizing Provider  acetaminophen (TYLENOL) 650 MG CR tablet Take 1,300 mg by mouth at bedtime as needed for pain.  [provider]  B-D UF III MINI PEN NEEDLES 31G X 5 MM MISC USE AS DIRECTED TID 02/14/19   [provider]  BAYER LOW DOSE 81 MG EC tablet Take 81 mg by mouth daily. Swallow whole.    [provider]  cetirizine (ZYRTEC) 10 MG tablet Take 10 mg by mouth daily as needed for allergies.    [provider]  Cholecalciferol (VITAMIN D-3 PO) Take 10,000 Units by mouth daily.    [provider]  docusate sodium (COLACE) 100 MG capsule Take 100 mg by mouth daily as needed for mild constipation.    [provider]  Famotidine (PEPCID AC PO) Take 1 tablet  by mouth at bedtime.    [provider]  feeding supplement, GLUCERNA SHAKE, (GLUCERNA SHAKE) LIQD Take 237 mLs by mouth 3 (three) times daily between meals. 09/16/21   Lavina Hamman, MD  fluconazole (DIFLUCAN) 200 MG tablet Take 200 mg by mouth 2 (two) times daily. 01/27/22   [provider]  folic acid (FOLVITE) 1 MG tablet Take 1 mg by mouth every evening.    [provider]  insulin aspart (NOVOLOG FLEXPEN) 100 UNIT/ML FlexPen Inject 2-10 Units into the skin See admin instructions. Inject 2-10 units into the skin three times a day with meals, per sliding scale:  Breakfast: BGL 80-199 = 8 units; 200-299 = 9 units; 300 or greater = 10 units Lunch: BGL 80-199 = 5 units; 200-299 = 6 units; 300 or greater = 7 units Supper/evening meal: BGL 80-199 = 2 units; 200-299 = 3 units; 300 or greater = 4 units    [provider]  insulin detemir (LEVEMIR FLEXTOUCH) 100 UNIT/ML FlexPen Inject 3-7 Units into the skin See admin instructions. Inject 7 units into the skin in the morning and 3 units at bedtime if BGL is 200 or below and 4 units if BGL is greater than 200    [provider]  Iron-FA-B Cmp-C-Biot-Probiotic (FUSION PLUS) CAPS Take 1 capsule by mouth daily with breakfast. 03/02/19   [provider]  levETIRAcetam (KEPPRA) 500 MG tablet Take 1 tablet (500 mg total) by mouth 2 (two) times daily. 10/11/19   Donzetta Starch, NP  liver oil-zinc oxide (DESITIN) 40 % ointment Apply topically 3 (three) times daily. 09/16/21   Lavina Hamman, MD  losartan (COZAAR) 50 MG tablet Take 50 mg by mouth daily.    [provider]  methotrexate (RHEUMATREX) 2.5 MG tablet Take 1 tablet (2.5 mg total) by mouth once a week. Caution:Chemotherapy. Protect from light. Patient not taking: Reported on 01/30/2022 01/27/22   Charlynne Cousins, MD  metoprolol tartrate (LOPRESSOR) 25 MG tablet Take 1 tablet (25 mg total) by mouth 2 (two) times daily. 01/20/22   Charlynne Cousins, MD  Multiple Vitamins-Calcium (ONE-A-DAY WOMENS FORMULA) TABS Take 1 tablet by mouth daily.    [provider]  nystatin (MYCOSTATIN) 100000 UNIT/ML suspension Take 1 mL by mouth 4 (four) times daily.    [provider]  omega-3 acid ethyl esters (LOVAZA) 1 g capsule Take 1 g by mouth 2 (two) times daily.    [provider]  OneTouch Delica Lancets 53I MISC IC-10 CODE:  E11.65  Check blood sugar    [provider]  ONETOUCH VERIO test strip 1 each 3 (three) times daily. 07/07/20   [provider]  polyethylene glycol (MIRALAX / GLYCOLAX) 17 g packet Take 17 g by mouth daily. 09/16/21  Lavina Hamman, MD  polyethylene glycol (MIRALAX / GLYCOLAX) 17 g packet Take 17 g by mouth as needed for mild constipation or moderate constipation.    [provider]  predniSONE (DELTASONE) 1 MG tablet Take 12 tablets (12 mg total) by mouth daily with breakfast. 01/21/22   Charlynne Cousins, MD  SYNTHROID 200 MCG tablet Take 200 mcg by mouth daily before breakfast. 04/23/21   [provider]      Allergies    Nsaids, Biaxin [clarithromycin], Sulfa antibiotics, Bactrim [sulfamethoxazole-trimethoprim], and Lisinopril    Review of Systems   Review of Systems  Physical Exam Updated Vital Signs BP (!) 158/62   Pulse 64   Temp (!) 97.5 F (36.4 C) (Oral)   Resp 18   SpO2 100%  Physical Exam Vitals and nursing note reviewed.  Constitutional:      General: She is not in acute distress.    Appearance: She is well-developed. She is not diaphoretic.  HENT:     Head: Normocephalic and atraumatic.  Eyes:     Conjunctiva/sclera: Conjunctivae normal.  Cardiovascular:     Rate and Rhythm: Normal rate and regular rhythm.     Heart sounds: Normal heart sounds. No murmur heard.    No friction rub. No gallop.  Pulmonary:     Effort: Pulmonary effort is normal. No respiratory distress.     Breath sounds: Normal breath sounds. No wheezing or  rales.  Abdominal:     General: There is no distension.     Palpations: Abdomen is soft.     Tenderness: There is no abdominal tenderness. There is no guarding.  Musculoskeletal:        General: No tenderness.     Cervical back: Normal range of motion.  Skin:    General: Skin is warm and dry.     Findings: No erythema or rash.     Comments: Multiple lesions secondary to dermatomyositis, no surrounding erythema  Neurological:     Mental Status: She is alert.     Comments: Reports it is Friday, we are at [facility], knows name Hard of hearing and can be slow to respond Left sided facial droop No pronator drift or drift on left with leg raise Tongue midline Normal sensation     ED Results / Procedures / Treatments   Labs (all labs ordered are listed, but only abnormal results are displayed) Labs Reviewed - No data to display  EKG None  Radiology No results found.  Procedures Procedures  {Document cardiac monitor, telemetry assessment procedure when appropriate:1}  Medications Ordered in ED Medications - No data to display  ED Course/ Medical Decision Making/ A&P     [phone] female with history of DM, htn, hyperlipidemia, CKD, thyroid disease, loop recorder in place, dermatomyositis, who presents with concern for altered mental status/episodes of generalized weakness/near syncope.   Differential diagnosis includes delirium/encephalopathy or near syncope secondary to anemia, electrolyte abnormality, cardiac abnormality, infection, hypothyroidism, other toxic/metabolic abnormalities.  History today does not sound consistent with seizure.   Given facial asymmetry (although per family this is baseline) altered episodes from [place]), CT head was completed and personally evaluated by me and shows no acute abnormalities.    Labs completed and personally evaluated by me show mild hyperglycemia, hgb 11.1, Na 136.  TSH is decreased to .022, free T4 pending, may be secondary to dosing  of levothyroxine.  Troponin elevated however less than what it was during previous admission and doubt ACS.  Does  report abdominal pain. Labs show no significant transaminitis, lipase elevated but not consistent with pancreatitis.  CT abdomen pelvis shows no acute abnormalities. Urinalysis ***.  CXR done given cough shows no sign of pneumonia or pulmonary edema. COVID/flu testing ***.  Lactic acid elevated to 3.5.  Given 2L of fluid, with ddx for this elevation including sepsis/dehydration/or even transient event leading to hypotension and hypoperfusion.  Will admit for continued monitoring in setting of near-syncopal episodes, elevated lactic acid.     {Document critical care time when appropriate:1} {Document review of labs and clinical decision tools ie heart score, Chads2Vasc2 etc:1}  {Document your independent review of radiology images, and any outside records:1} {Document your discussion with family members, caretakers, and with consultants:1} {Document social determinants of health affecting pt's care:1} {Document your decision making why or why not admission, treatments were needed:1} Final Clinical Impression(s) / ED Diagnoses Final diagnoses:  None    Rx / DC Orders ED Discharge Orders     None

## 2022-05-09 NOTE — H&P (Addendum)
History and Physical  [name] [name] STM:[id number] DOB: October 16, 1933 DOA: 05/09/2022  Referring physician: Dr. Gareth Morgan, [place]  PCP: Lajean Manes, MD  Outpatient Specialists: Cardiology Patient coming from: Home  Chief Complaint:  Near syncope  HPI: [name] [name] is a 88 y.o. female with medical history significant for near syncope s/p loop recorder implant, HTN, hypothyroidism, iron deficiency, who presents from home with an near syncope event.  Associated with generalized weakness.  Per her daughter, the patient has had a flareup of her dermatomyositis, and her home p.o. steroids dose was increased.  Her daughter is concerned that it may have contributed to her symptoms.  Has had difficulty swallowing thin liquids and there was also concern for aspiration.  She has had a cough for the last week.  Due to increased weakness EMS was activated.  Upon EMS arrival, the patient was lethargic and they could barely feel her radial pulse.  She was given 200 cc bolus of IV fluid and brought into the ED for further evaluation.  In the ED, work-up was unremarkable except for lactic acidosis 3.5 and low TSH 0.022, on home levothyroxine.  The patient received 2 L IV fluid bolus in the ED.  EDP requested admission for further management.  The patient was admitted by [facility], hospitalist service.  ED Course: Tmax 97.5.  BP 161/57, pulse 67, respiratory rate 13, O2 saturation 98% on room air.  Lab studies remarkable for TSH 0.022.  Lactic acid 3.5, 2.4.  Hemoglobin 11.1.  Serum glucose 191.  Lipase 119.  Review of Systems: Review of systems as noted in the HPI. All other systems reviewed and are negative.   Past Medical History:  Diagnosis Date   Arthritis    "knees, legs" (06/10/2016)   Ascites    Chronic kidney disease    "related to my diabetes"   Chronic lower back pain    Diabetes mellitus without complication (HCC)    Eczema    GERD (gastroesophageal reflux disease)    High cholesterol     Hyperlipidemia    Hypertension    Hypothyroidism    OAB (overactive bladder)    Osteoporosis    Thyroid disease    Type II diabetes mellitus ([place])    Urticaria    Past Surgical History:  Procedure Laterality Date   ABDOMINAL HYSTERECTOMY     KNEE ARTHROSCOPY     LAPAROTOMY N/A 12/03/2016   Procedure: EXPLORATORY LAPAROTOMY, LYSIS OF ADHESIONS;  Surgeon: Armandina Gemma, MD;  Location: [facility];  Service: General;  Laterality: N/A;   LOOP RECORDER INSERTION N/A 08/23/2019   Procedure: LOOP RECORDER INSERTION;  Surgeon: Evans Lance, MD;  Location: [place] [facility];  Service: Cardiovascular;  Laterality: N/A;   vocal cord polyps      Social History:  reports that she has never smoked. She quit smokeless tobacco use about 38 years ago.  Her smokeless tobacco use included chew. She reports that she does not drink alcohol and does not use drugs.   Allergies  Allergen Reactions   Nsaids Other (See Comments)    No NSAIDS due to kidney function- ESPECIALLY ibuprofen or Aleve   Biaxin [Clarithromycin] Hives   Bactrim [Sulfamethoxazole-Trimethoprim] Hives and Rash   Lisinopril Cough   Sulfa Antibiotics Hives, Rash and Other (See Comments)    NO sulfa-based meds!!     Family History  Problem Relation Age of Onset   Diabetes Mother    Hypertension Mother    Asthma Father  Diabetes Sister    Stomach cancer Sister    Diabetes Brother    Allergic rhinitis Neg Hx    Eczema Neg Hx    Urticaria Neg Hx       Prior to Admission medications   Medication Sig Start Date End Date Taking? Authorizing Provider  acetaminophen (TYLENOL) 650 MG CR tablet Take 1,300 mg by mouth at bedtime as needed for pain.    [provider]  B-D UF III MINI PEN NEEDLES 31G X 5 MM MISC USE AS DIRECTED TID 02/14/19   [provider]  BAYER LOW DOSE 81 MG EC tablet Take 81 mg by mouth daily. Swallow whole.    [provider]  cetirizine (ZYRTEC) 10 MG tablet Take 10 mg by mouth  daily as needed for allergies.    [provider]  Cholecalciferol (VITAMIN D-3 PO) Take 10,000 Units by mouth daily.    [provider]  docusate sodium (COLACE) 100 MG capsule Take 100 mg by mouth daily as needed for mild constipation.    [provider]  Famotidine (PEPCID AC PO) Take 1 tablet by mouth at bedtime.    [provider]  feeding supplement, GLUCERNA SHAKE, (GLUCERNA SHAKE) LIQD Take 237 mLs by mouth 3 (three) times daily between meals. 09/16/21   Lavina Hamman, MD  fluconazole (DIFLUCAN) 200 MG tablet Take 200 mg by mouth 2 (two) times daily. 01/27/22   [provider]  folic acid (FOLVITE) 1 MG tablet Take 1 mg by mouth every evening.    [provider]  insulin aspart (NOVOLOG FLEXPEN) 100 UNIT/ML FlexPen Inject 2-10 Units into the skin See admin instructions. Inject 2-10 units into the skin three times a day with meals, per sliding scale:  Breakfast: BGL 80-199 = 8 units; 200-299 = 9 units; 300 or greater = 10 units Lunch: BGL 80-199 = 5 units; 200-299 = 6 units; 300 or greater = 7 units Supper/evening meal: BGL 80-199 = 2 units; 200-299 = 3 units; 300 or greater = 4 units    [provider]  insulin detemir (LEVEMIR FLEXTOUCH) 100 UNIT/ML FlexPen Inject 3-7 Units into the skin See admin instructions. Inject 7 units into the skin in the morning and 3 units at bedtime if BGL is 200 or below and 4 units if BGL is greater than 200    [provider]  Iron-FA-B Cmp-C-Biot-Probiotic (FUSION PLUS) CAPS Take 1 capsule by mouth daily with breakfast. 03/02/19   [provider]  levETIRAcetam (KEPPRA) 500 MG tablet Take 1 tablet (500 mg total) by mouth 2 (two) times daily. 10/11/19   Donzetta Starch, NP  liver oil-zinc oxide (DESITIN) 40 % ointment Apply topically 3 (three) times daily. 09/16/21   Lavina Hamman, MD  losartan (COZAAR) 50 MG tablet Take 50 mg by mouth daily.    [provider]  methotrexate  (RHEUMATREX) 2.5 MG tablet Take 1 tablet (2.5 mg total) by mouth once a week. Caution:Chemotherapy. Protect from light. Patient not taking: Reported on 01/30/2022 01/27/22   Charlynne Cousins, MD  metoprolol tartrate (LOPRESSOR) 25 MG tablet Take 1 tablet (25 mg total) by mouth 2 (two) times daily. 01/20/22   Charlynne Cousins, MD  Multiple Vitamins-Calcium (ONE-A-DAY WOMENS FORMULA) TABS Take 1 tablet by mouth daily.    [provider]  nystatin (MYCOSTATIN) 100000 UNIT/ML suspension Take 1 mL by mouth 4 (four) times daily.    [provider]  omega-3 acid ethyl esters (  LOVAZA) 1 g capsule Take 1 g by mouth 2 (two) times daily.    [provider]  OneTouch Delica Lancets 17H MISC IC-10 CODE:  E11.65  Check blood sugar    [provider]  ONETOUCH VERIO test strip 1 each 3 (three) times daily. 07/07/20   [provider]  polyethylene glycol (MIRALAX / GLYCOLAX) 17 g packet Take 17 g by mouth daily. 09/16/21   Lavina Hamman, MD  polyethylene glycol (MIRALAX / GLYCOLAX) 17 g packet Take 17 g by mouth as needed for mild constipation or moderate constipation.    [provider]  predniSONE (DELTASONE) 1 MG tablet Take 12 tablets (12 mg total) by mouth daily with breakfast. 01/21/22   Charlynne Cousins, MD  SYNTHROID 200 MCG tablet Take 200 mcg by mouth daily before breakfast. 04/23/21   [provider]    Physical Exam: BP (!) 171/103   Pulse 78   Temp 98 F (36.7 C) (Oral)   Resp 13   Ht '5\' 5"'$  (1.651 m)   Wt 43.8 kg   SpO2 98%   BMI 16.07 kg/m   General: 88 y.o. year-old female well developed well nourished in no acute distress.  Alert and interactive. Cardiovascular: Regular rate and rhythm with no rubs or gallops.  No thyromegaly or JVD noted.  No lower extremity edema. 2/4 pulses in all 4 extremities. Respiratory: Clear to auscultation with no wheezes or rales. Good inspiratory effort. Abdomen: Soft nontender nondistended  with normal bowel sounds x4 quadrants. Muskuloskeletal: No cyanosis, clubbing or edema noted bilaterally Neuro: CN II-XII intact, strength, sensation, reflexes Skin: No ulcerative lesions noted.  Left arm tear, POA Psychiatry: Judgement and insight appear altered. Mood is appropriate for condition and setting          Labs on Admission:  Basic Metabolic Panel: Recent Labs  Lab 05/09/22 1629  NA 136  K 3.7  CL 101  CO2 30  GLUCOSE 191*  BUN 22  CREATININE 0.90  CALCIUM 9.9   Liver Function Tests: Recent Labs  Lab 05/09/22 1629  AST 51*  ALT 33  ALKPHOS 74  BILITOT 0.4  PROT 6.5  ALBUMIN 3.1*   Recent Labs  Lab 05/09/22 1629  LIPASE 119*   No results for input(s): "AMMONIA" in the last 168 hours. CBC: Recent Labs  Lab 05/09/22 1629  WBC 6.8  NEUTROABS 5.9  HGB 11.1*  HCT 34.1*  MCV 96.1  PLT 191   Cardiac Enzymes: No results for input(s): "CKTOTAL", "CKMB", "CKMBINDEX", "TROPONINI" in the last 168 hours.  BNP (last 3 results) No results for input(s): "BNP" in the last 8760 hours.  ProBNP (last 3 results) No results for input(s): "PROBNP" in the last 8760 hours.  CBG: Recent Labs  Lab 05/09/22 1558 05/09/22 2053  GLUCAP 180* 175*    Radiological Exams on Admission: CT Head Wo Contrast  Result Date: 05/09/2022 CLINICAL DATA:  Altered mental status. EXAM: CT HEAD WITHOUT CONTRAST TECHNIQUE: Contiguous axial images were obtained from the base of the skull through the vertex without intravenous contrast. RADIATION DOSE REDUCTION: This exam was performed according to the departmental dose-optimization program which includes automated exposure control, adjustment of the mA and/or kV according to patient size and/or use of iterative reconstruction technique. COMPARISON:  11/21/2021 FINDINGS: Brain: No evidence of intracranial hemorrhage, acute infarction, hydrocephalus, extra-axial collection, or mass lesion/mass effect. Stable moderate cerebral atrophy  and chronic small vessel disease. Vascular:  No hyperdense vessel or other acute  findings. Skull: No evidence of fracture or other significant bone abnormality. Sinuses/Orbits:  No acute findings. Other: None. IMPRESSION: No acute intracranial abnormality. Stable cerebral atrophy and chronic small vessel disease. Electronically Signed   By: Marlaine Hind M.D.   On: 05/09/2022 18:49   CT ABDOMEN PELVIS W CONTRAST  Result Date: 05/09/2022 CLINICAL DATA:  Right lower quadrant pain. EXAM: CT ABDOMEN AND PELVIS WITH CONTRAST TECHNIQUE: Multidetector CT imaging of the abdomen and pelvis was performed using the standard protocol following bolus administration of intravenous contrast. RADIATION DOSE REDUCTION: This exam was performed according to the departmental dose-optimization program which includes automated exposure control, adjustment of the mA and/or kV according to patient size and/or use of iterative reconstruction technique. CONTRAST:  130m OMNIPAQUE IOHEXOL 300 MG/ML  SOLN COMPARISON:  08/18/2019 FINDINGS: Lower Chest: No acute findings. Hepatobiliary: No hepatic masses identified. Gallbladder is unremarkable. No evidence of biliary ductal dilatation. Pancreas:  No mass or inflammatory changes. Spleen: Within normal limits in size and appearance. Adrenals/Urinary Tract: No suspicious masses identified. No evidence of ureteral calculi or hydronephrosis. Stomach/Bowel: No evidence of obstruction, inflammatory process or abnormal fluid collections. Although the appendix is not directly visualized, no inflammatory process seen in region of the cecum or elsewhere. Vascular/Lymphatic: No pathologically enlarged lymph nodes. No acute vascular findings. Aortic atherosclerotic calcification incidentally noted. Reproductive: Prior hysterectomy noted. Adnexal regions are unremarkable in appearance. Other:  None. Musculoskeletal:  No suspicious bone lesions identified. IMPRESSION: No acute findings within the abdomen  or pelvis. Aortic Atherosclerosis (ICD10-I70.0). Electronically Signed   By: JMarlaine HindM.D.   On: 05/09/2022 18:46   DG Chest 2 View  Result Date: 05/09/2022 CLINICAL DATA:  Cough. EXAM: CHEST - 2 VIEW COMPARISON:  01/16/2022 FINDINGS: The heart size and mediastinal contours are within normal limits. Cardiac loop recorder again noted in anterior chest wall. Aortic atherosclerotic calcification incidentally noted. Both lungs are clear. The visualized skeletal structures are unremarkable. IMPRESSION: No active cardiopulmonary disease. Electronically Signed   By: JMarlaine HindM.D.   On: 05/09/2022 17:06    EKG: I independently viewed the EKG done and my findings are as followed: Sinus rhythm rate of 72.  Nonspecific ST-T changes.  QTc 450.  Assessment/Plan Present on Admission:  Near syncope  Principal Problem:   Near syncope  Near syncope, unclear cause. Status post loop recorder implant.  Follow cardiology's interpretation of results. Obtain orthostatic vital signs Supportive care with gentle IV fluid hydration Fall precautions  Lactic acidosis, unclear etiology Presented with lactic acid 3.5 Repeat 2.4. No evidence of active infective process. Chest x-ray nonacute, UA negative for pyuria. Continue IV fluid hydration Repeat lactic acid in the morning  Hypothyroidism on home levothyroxine Presenting TSH 0.022, free T4 pending Reduce home levothyroxine dose to 100 mcg from 200 mcg for now. Follow free T4 and adjust dose if necessary.  Type 2 diabetes with hyperglycemia Obtain hemoglobin A1c Start insulin sliding scale  Possible dysphagia to liquids? Speech therapist evaluation Aspiration precautions with nectar thick liquid for now until passes swallow evaluation The patient was witnessed tolerating a solid diet fed by her daughter at bedside.  Essential hypertension, BP not at goal, elevated. Resume home oral antihypertensives IV hydralazine as needed with  parameters. Monitor vital signs  Physical debility PT OT assessment Fall precautions.  Left arm tear, POA Wound care per specialist Local wound care.   DVT prophylaxis: Subcu Lovenox daily  Code Status: Full code  Family Communication: Updated her daughter at bedside.  Disposition Plan: Admitted to progressive care unit.  Consults called: None.  Admission status: Observation status.      Kayleen Memos MD [facility] Pager [phone]  If 7PM-7AM, please contact night-coverage [email or web address] Password [facility]  05/09/2022, 11:54 PM

## 2022-05-10 ENCOUNTER — Observation Stay (HOSPITAL_COMMUNITY): Payer: Medicare HMO

## 2022-05-10 DIAGNOSIS — R55 Syncope and collapse: Secondary | ICD-10-CM | POA: Diagnosis not present

## 2022-05-10 LAB — CBC
HCT: 30.7 % — ABNORMAL LOW (ref 36.0–46.0)
Hemoglobin: 9.9 g/dL — ABNORMAL LOW (ref 12.0–15.0)
MCH: 31.1 pg (ref 26.0–34.0)
MCHC: 32.2 g/dL (ref 30.0–36.0)
MCV: 96.5 fL (ref 80.0–100.0)
Platelets: 174 10*3/uL (ref 150–400)
RBC: 3.18 MIL/uL — ABNORMAL LOW (ref 3.87–5.11)
RDW: 13.5 % (ref 11.5–15.5)
WBC: 6.6 10*3/uL (ref 4.0–10.5)
nRBC: 0 % (ref 0.0–0.2)

## 2022-05-10 LAB — T4, FREE: Free T4: 2.6 ng/dL — ABNORMAL HIGH (ref 0.61–1.12)

## 2022-05-10 LAB — COMPREHENSIVE METABOLIC PANEL
ALT: 31 U/L (ref 0–44)
AST: 47 U/L — ABNORMAL HIGH (ref 15–41)
Albumin: 2.8 g/dL — ABNORMAL LOW (ref 3.5–5.0)
Alkaline Phosphatase: 76 U/L (ref 38–126)
Anion gap: 9 (ref 5–15)
BUN: 22 mg/dL (ref 8–23)
CO2: 26 mmol/L (ref 22–32)
Calcium: 9.5 mg/dL (ref 8.9–10.3)
Chloride: 103 mmol/L (ref 98–111)
Creatinine, Ser: 0.75 mg/dL (ref 0.44–1.00)
GFR, Estimated: >60 mL/min (ref >60)
Glucose, Bld: 209 mg/dL — ABNORMAL HIGH (ref 70–99)
Potassium: 3.9 mmol/L (ref 3.5–5.1)
Sodium: 138 mmol/L (ref 135–145)
Total Bilirubin: 0.4 mg/dL (ref 0.3–1.2)
Total Protein: 6 g/dL — ABNORMAL LOW (ref 6.5–8.1)

## 2022-05-10 LAB — CBC WITH DIFFERENTIAL/PLATELET
Abs Immature Granulocytes: 0.09 10*3/uL — ABNORMAL HIGH (ref 0.00–0.07)
Basophils Absolute: 0 10*3/uL (ref 0.0–0.1)
Basophils Relative: 0 %
Eosinophils Absolute: 0.5 10*3/uL (ref 0.0–0.5)
Eosinophils Relative: 7 %
HCT: 32.5 % — ABNORMAL LOW (ref 36.0–46.0)
Hemoglobin: 10.8 g/dL — ABNORMAL LOW (ref 12.0–15.0)
Immature Granulocytes: 1 %
Lymphocytes Relative: 17 %
Lymphs Abs: 1.3 10*3/uL (ref 0.7–4.0)
MCH: 31.1 pg (ref 26.0–34.0)
MCHC: 33.2 g/dL (ref 30.0–36.0)
MCV: 93.7 fL (ref 80.0–100.0)
Monocytes Absolute: 1 10*3/uL (ref 0.1–1.0)
Monocytes Relative: 13 %
Neutro Abs: 5 10*3/uL (ref 1.7–7.7)
Neutrophils Relative %: 62 %
Platelets: 203 10*3/uL (ref 150–400)
RBC: 3.47 MIL/uL — ABNORMAL LOW (ref 3.87–5.11)
RDW: 13.4 % (ref 11.5–15.5)
WBC: 8 10*3/uL (ref 4.0–10.5)
nRBC: 0 % (ref 0.0–0.2)

## 2022-05-10 LAB — ECHOCARDIOGRAM COMPLETE
AR max vel: 2.72 cm2
AV Area VTI: 3.01 cm2
AV Area mean vel: 2.54 cm2
AV Mean grad: 4 mmHg
AV Peak grad: 6.9 mmHg
Ao pk vel: 1.31 m/s
Area-P 1/2: 3.17 cm2
Calc EF: 54.8 %
Height: 65 in
MV M vel: 3.94 m/s
MV Peak grad: 62.1 mmHg
S' Lateral: 2.8 cm
Single Plane A2C EF: 48.8 %
Single Plane A4C EF: 59.9 %
Weight: 1544.98 oz

## 2022-05-10 LAB — GLUCOSE, CAPILLARY
Glucose-Capillary: 187 mg/dL — ABNORMAL HIGH (ref 70–99)
Glucose-Capillary: 208 mg/dL — ABNORMAL HIGH (ref 70–99)
Glucose-Capillary: 212 mg/dL — ABNORMAL HIGH (ref 70–99)
Glucose-Capillary: 245 mg/dL — ABNORMAL HIGH (ref 70–99)
Glucose-Capillary: 357 mg/dL — ABNORMAL HIGH (ref 70–99)

## 2022-05-10 LAB — CREATININE, SERUM
Creatinine, Ser: 0.61 mg/dL (ref 0.44–1.00)
GFR, Estimated: >60 mL/min (ref >60)

## 2022-05-10 LAB — HEMOGLOBIN A1C
Hgb A1c MFr Bld: 8.9 % — ABNORMAL HIGH (ref 4.8–5.6)
Mean Plasma Glucose: 208.73 mg/dL

## 2022-05-10 LAB — MAGNESIUM: Magnesium: 1.8 mg/dL (ref 1.7–2.4)

## 2022-05-10 LAB — LACTIC ACID, PLASMA: Lactic Acid, Venous: 1.2 mmol/L (ref 0.5–1.9)

## 2022-05-10 LAB — PHOSPHORUS: Phosphorus: 2.9 mg/dL (ref 2.5–4.6)

## 2022-05-10 MED ORDER — FUSION PLUS PO CAPS: 1.0000 | ORAL_CAPSULE | Freq: Every day | ORAL | Status: DC

## 2022-05-10 MED ORDER — METHOTREXATE SODIUM 2.5 MG PO TABS: 20.0000 mg | ORAL_TABLET | ORAL | Status: DC

## 2022-05-10 MED ORDER — NYSTATIN 100000 UNIT/ML MT SUSP
5.0000 mL | Freq: Four times a day (QID) | OROMUCOSAL | Status: AC
  Administered 2022-05-10 – 2022-05-11 (×8): 500000 [IU] via ORAL
  Filled 2022-05-10 (×8): qty 5

## 2022-05-10 MED ORDER — FOLIC ACID 1 MG PO TABS: 1.0000 mg | ORAL_TABLET | Freq: Every day | ORAL | Status: DC

## 2022-05-10 MED ORDER — LEVETIRACETAM 500 MG PO TABS
500.0000 mg | ORAL_TABLET | Freq: Two times a day (BID) | ORAL | Status: DC
  Administered 2022-05-10 – 2022-05-13 (×8): 500 mg via ORAL
  Filled 2022-05-10 (×8): qty 1

## 2022-05-10 MED ORDER — VITAMIN D 25 MCG (1000 UNIT) PO TABS
10000.0000 [IU] | ORAL_TABLET | Freq: Every day | ORAL | Status: DC
  Filled 2022-05-10: qty 10

## 2022-05-10 MED ORDER — INSULIN DETEMIR 100 UNIT/ML ~~LOC~~ SOLN
3.0000 [IU] | Freq: Every day | SUBCUTANEOUS | Status: DC
  Administered 2022-05-10 – 2022-05-11 (×2): 3 [IU] via SUBCUTANEOUS
  Filled 2022-05-10 (×3): qty 0.03

## 2022-05-10 MED ORDER — SODIUM CHLORIDE 0.9 % IV SOLN: INTRAVENOUS | Status: AC

## 2022-05-10 MED ORDER — PREDNISONE 5 MG PO TABS: 10.0000 mg | ORAL_TABLET | Freq: Every day | ORAL | Status: DC

## 2022-05-10 MED ORDER — CEPHALEXIN 500 MG PO CAPS
500.0000 mg | ORAL_CAPSULE | Freq: Three times a day (TID) | ORAL | Status: DC
  Administered 2022-05-10 – 2022-05-13 (×10): 500 mg via ORAL
  Filled 2022-05-10 (×10): qty 1

## 2022-05-10 MED ORDER — PREDNISONE 5 MG PO TABS: 2.5000 mg | ORAL_TABLET | ORAL | Status: DC

## 2022-05-10 MED ORDER — METHOTREXATE 2.5 MG PO TABS
2.5000 mg | ORAL_TABLET | ORAL | Status: DC
  Filled 2022-05-10: qty 1

## 2022-05-10 MED ORDER — POLYETHYLENE GLYCOL 3350 17 G PO PACK: 17.0000 g | PACK | Freq: Every day | ORAL | Status: DC | PRN

## 2022-05-10 MED ORDER — PREDNISONE 5 MG PO TABS: 5.0000 mg | ORAL_TABLET | Freq: Every day | ORAL | Status: DC

## 2022-05-10 MED ORDER — LORATADINE 10 MG PO TABS
10.0000 mg | ORAL_TABLET | Freq: Every day | ORAL | Status: DC
  Administered 2022-05-10 – 2022-05-13 (×4): 10 mg via ORAL
  Filled 2022-05-10 (×4): qty 1

## 2022-05-10 MED ORDER — INSULIN DETEMIR 100 UNIT/ML FLEXPEN: 3.0000 [IU] | PEN_INJECTOR | SUBCUTANEOUS | Status: DC

## 2022-05-10 MED ORDER — MELATONIN 5 MG PO TABS: 5.0000 mg | ORAL_TABLET | Freq: Every evening | ORAL | Status: DC | PRN

## 2022-05-10 MED ORDER — INSULIN DETEMIR 100 UNIT/ML ~~LOC~~ SOLN
7.0000 [IU] | Freq: Every day | SUBCUTANEOUS | Status: DC
  Administered 2022-05-11 – 2022-05-13 (×3): 7 [IU] via SUBCUTANEOUS
  Filled 2022-05-10 (×4): qty 0.07

## 2022-05-10 MED ORDER — BETAMETHASONE DIPROPIONATE 0.05 % EX OINT
1.0000 "application " | TOPICAL_OINTMENT | Freq: Two times a day (BID) | CUTANEOUS | Status: DC
  Administered 2022-05-10 – 2022-05-13 (×6): 1 via TOPICAL

## 2022-05-10 MED ORDER — FOLIC ACID 1 MG PO TABS
1.0000 mg | ORAL_TABLET | Freq: Every day | ORAL | Status: DC
  Administered 2022-05-10 – 2022-05-12 (×4): 1 mg via ORAL
  Filled 2022-05-10 (×4): qty 1

## 2022-05-10 MED ORDER — ACETAMINOPHEN 325 MG PO TABS: 650.0000 mg | ORAL_TABLET | Freq: Four times a day (QID) | ORAL | Status: DC | PRN

## 2022-05-10 MED ORDER — FAMOTIDINE 20 MG PO TABS
40.0000 mg | ORAL_TABLET | Freq: Every day | ORAL | Status: DC
  Administered 2022-05-10: 40 mg via ORAL
  Filled 2022-05-10: qty 2

## 2022-05-10 MED ORDER — METHOTREXATE SODIUM 2.5 MG PO TABS
20.0000 mg | ORAL_TABLET | Freq: Every day | ORAL | Status: DC
  Administered 2022-05-10: 20 mg via ORAL
  Filled 2022-05-10: qty 8

## 2022-05-10 MED ORDER — PREDNISONE 5 MG PO TABS: 2.5000 mg | ORAL_TABLET | Freq: Every day | ORAL | Status: DC

## 2022-05-10 MED ORDER — TAB-A-VITE/IRON PO TABS
1.0000 | ORAL_TABLET | Freq: Every day | ORAL | Status: DC
  Administered 2022-05-11 – 2022-05-13 (×3): 1 via ORAL
  Filled 2022-05-10 (×4): qty 1

## 2022-05-10 MED ORDER — PREDNISONE 5 MG PO TABS
15.0000 mg | ORAL_TABLET | Freq: Every day | ORAL | Status: DC
  Administered 2022-05-10 – 2022-05-13 (×4): 15 mg via ORAL
  Filled 2022-05-10 (×4): qty 3

## 2022-05-10 NOTE — Progress Notes (Signed)
PROGRESS NOTE    [name] [name]  ZRA:[id number] DOB: 1933/12/14 DOA: 05/09/2022 PCP: Lajean Manes, MD   Brief Narrative:  Patient is an 88 year old African-American female with a past medical history significant for near syncope status post loop recorder implantation, hypertension, hypothyroidism, history of iron deficiency anemia as well as other comorbidities who presented from her home with syncope x2.  This was associated with generalized weakness and per the daughter patient had a flare of her dermatomyositis and her home p.o. steroids were increased.  Patient states that she passed out on the toilet initially and then passed out sitting in the chair.  Her daughter was concerned that her dermatomyositis and steroids have made it caused her symptoms.  She has had difficulty swallowing thin liquids and there is also concern for aspiration.  She did have a cough last week or so and given her increased weakness and EMS was called and patient was lethargic and they could barely feel her radial pulse.  Was given a 200 cc bolus and brought to the ED for further evaluation.  In the ED she was noted to have lactic acidosis and a low TSH but remains on levothyroxine.  She was given 2 L of IV fluid boluses and admitted for further evaluation for syncope.  Initial labs show that her lactic acid was 3.5 and repeat was then 2.4.  Echocardiogram was ordered as well as PT and OT.   Assessment and Plan:  Syncope -Had 2 episodes -Status post loop recorder implant.  Follow cardiology's interpretation of results. -Obtain orthostatic vital signs -Check echocardiogram and showed "Left ventricular ejection fraction, by estimation, is 65 to 70%. The left ventricle has normal function. The left ventricle has no regional  wall motion abnormalities. There is mild asymmetric left ventricular hypertrophy of the basal-septal segment. Left ventricular diastolic parameters are indeterminate. Right ventricular systolic  function is normal. The right ventricular size is normal. There is normal pulmonary artery systolic pressure. The estimated right ventricular systolic pressure is 54.5 mmHg. " -Initial troponin was 21 and repeat was 17 -Urinalysis was unremarkable urine culture still pending -Supportive care with gentle IV fluid hydration -Continue Fall precautions -PT OT to further evaluate and treat -May need to discuss with cardiology given that she does have a loop recorder   Lactic acidosis, unclear etiology patient is resolved -Presented with lactic acid 3.5 -> 2.4 -> 1.2 -No evidence of active infective process. -Chest x-ray nonacute, UA negative for pyuria. -Continue IV fluid hydration with normal saline at 50 MLS per hour -Repeat lactic acid has resolved   Hypothyroidism on home levothyroxine -Presenting TSH 0.022, free T4 pending -Reduced home levothyroxine dose to 100 mcg from 200 mcg for now. -Follow free T4 and was 2.60. -She will need repeat thyroid function studies within 2 weeks   Type 2 diabetes with hyperglycemia -Hemoglobin A1c of 8.9 -Start insulin sliding scale -Continue monitor CBGs per protocol and they have been ranging from 175-245   Possible dysphagia to liquids? -Speech therapist evaluation done and she had an MBS which found swallow initiation delayed with thin liquids but no aspiration.  SLP recommends to continue regular solids with nectar thick liquids and plan for MBS tomorrow to see if there is any change in her swallow function -The patient was witnessed tolerating a solid diet fed by her daughter at bedside.   Essential Hypertension -Resume home oral antihypertensives -IV hydralazine as needed with parameters. -Continue to monitor blood pressures per protocol and this is much  improved since yesterday as she had a blood pressure as high as 210/92 but is now 122/64   Physical debility -PT OT assessment pending -Fall precautions.   Left arm tear, POA -Wound care  Consult -Local wound care.   Slightly abnormal AST -Patient's AST was 47 and ALT was normal at 31 -Continue monitor and trend and repeat CMP in a.m. and if necessary will obtain a right upper quadrant ultrasound and acute hepatitis panel if not improving  Underweight -Estimated body mass index is 16.07 kg/m as calculated from the following:   Height as of this encounter: '5\' 5"'$  (1.651 m).   Weight as of this encounter: 43.8 kg. -Consult nutrition for further evaluation  DVT prophylaxis: enoxaparin (LOVENOX) injection 30 mg Start: 05/09/22 2200    Code Status: Full Code Family Communication: Discussed with daughter at bedside  Disposition Plan:  Level of care: Progressive Status is: Observation The patient will require care spanning > 2 midnights and should be moved to inpatient because: Needs an MBS tomorrow and needs further evaluation by PT and OT prior to safe discharge disposition   Consultants:  None  Procedures:  ECHOCARDIOGRAM IMPRESSIONS     1. Left ventricular ejection fraction, by estimation, is 65 to 70%. The  left ventricle has normal function. The left ventricle has no regional  wall motion abnormalities. There is mild asymmetric left ventricular  hypertrophy of the basal-septal segment.  Left ventricular diastolic parameters are indeterminate.   2. Right ventricular systolic function is normal. The right ventricular  size is normal. There is normal pulmonary artery systolic pressure. The  estimated right ventricular systolic pressure is 50.5 mmHg.   3. The mitral valve is abnormal. Trivial mitral valve regurgitation.   4. The aortic valve is tricuspid. Aortic valve regurgitation is not  visualized. Aortic valve sclerosis/calcification is present, without any  evidence of aortic stenosis.   5. The inferior vena cava is normal in size with greater than 50%  respiratory variability, suggesting right atrial pressure of 3 mmHg.   FINDINGS   Left Ventricle: Left  ventricular ejection fraction, by estimation, is 65  to 70%. The left ventricle has normal function. The left ventricle has no  regional wall motion abnormalities. The left ventricular internal cavity  size was normal in size. There is   mild asymmetric left ventricular hypertrophy of the basal-septal segment.  Left ventricular diastolic parameters are indeterminate.   Right Ventricle: The right ventricular size is normal. No increase in  right ventricular wall thickness. Right ventricular systolic function is  normal. There is normal pulmonary artery systolic pressure. The tricuspid  regurgitant velocity is 2.67 m/s, and   with an assumed right atrial pressure of 3 mmHg, the estimated right  ventricular systolic pressure is 39.7 mmHg.   Left Atrium: Left atrial size was normal in size.   Right Atrium: Right atrial size was normal in size.   Pericardium: There is no evidence of pericardial effusion.   Mitral Valve: The mitral valve is abnormal. Trivial mitral valve  regurgitation.   Tricuspid Valve: The tricuspid valve is normal in structure. Tricuspid  valve regurgitation is trivial.   Aortic Valve: The aortic valve is tricuspid. Aortic valve regurgitation is  not visualized. Aortic valve sclerosis/calcification is present, without  any evidence of aortic stenosis. Aortic valve mean gradient measures 4.0  mmHg. Aortic valve peak gradient  measures 6.9 mmHg. Aortic valve area, by VTI measures 3.01 cm.   Pulmonic Valve: The pulmonic valve was not well  visualized. Pulmonic valve  regurgitation is not visualized.   Aorta: The aortic root is normal in size and structure.   Venous: The inferior vena cava is normal in size with greater than 50%  respiratory variability, suggesting right atrial pressure of 3 mmHg.   IAS/Shunts: The interatrial septum was not well visualized.      LEFT VENTRICLE  PLAX 2D  LVIDd:         4.10 cm     Diastology  LVIDs:         2.80 cm     LV e'  medial:    5.00 cm/s  LV PW:         0.80 cm     LV E/e' medial:  15.9  LV IVS:        0.90 cm     LV e' lateral:   6.53 cm/s  LVOT diam:     2.05 cm     LV E/e' lateral: 12.1  LV SV:         83  LV SV Index:   57  LVOT Area:     3.30 cm     LV Volumes (MOD)  LV vol d, MOD A2C: 49.4 ml  LV vol d, MOD A4C: 59.6 ml  LV vol s, MOD A2C: 25.3 ml  LV vol s, MOD A4C: 23.9 ml  LV SV MOD A2C:     24.1 ml  LV SV MOD A4C:     59.6 ml  LV SV MOD BP:      29.7 ml   RIGHT VENTRICLE  RV Basal diam:  3.40 cm  RV Mid diam:    3.00 cm  RV S prime:     11.90 cm/s  TAPSE (M-mode): 2.7 cm   LEFT ATRIUM           Index        RIGHT ATRIUM           Index  LA diam:      2.40 cm 1.65 cm/m   RA Area:     11.90 cm  LA Vol (A4C): 32.0 ml 22.04 ml/m  RA Volume:   26.40 ml  18.19 ml/m   AORTIC VALVE                    PULMONIC VALVE  AV Area (Vmax):    2.72 cm     PV Vmax:       0.96 m/s  AV Area (Vmean):   2.54 cm     PV Peak grad:  3.7 mmHg  AV Area (VTI):     3.01 cm  AV Vmax:           131.00 cm/s  AV Vmean:          97.700 cm/s  AV VTI:            0.276 m  AV Peak Grad:      6.9 mmHg  AV Mean Grad:      4.0 mmHg  LVOT Vmax:         108.00 cm/s  LVOT Vmean:        75.300 cm/s  LVOT VTI:          0.251 m  LVOT/AV VTI ratio: 0.91     AORTA  Ao Root diam: 3.00 cm  Ao Asc diam:  3.00 cm   MITRAL VALVE  TRICUSPID VALVE  MV Area (PHT): 3.17 cm     TR Peak grad:   28.5 mmHg  MV Decel Time: 239 msec     TR Vmax:        267.00 cm/s  MR Peak grad: 62.1 mmHg  MR Vmax:      394.00 cm/s   SHUNTS  MV E velocity: 79.30 cm/s   Systemic VTI:  0.25 m  MV A velocity: 100.00 cm/s  Systemic Diam: 2.05 cm  MV E/A ratio:  0.79   Antimicrobials:  Anti-infectives (From admission, onward)    Start     Dose/Rate Route Frequency Ordered Stop   05/10/22 1000  cephALEXin (KEFLEX) capsule 500 mg        500 mg Oral 3 times daily 05/10/22 0130         Subjective: Seen and examined at  bedside" she states that she passed out twice.  Once when she was on the toilet and the sink, she was sitting in a chair.  She states that she feels okay now.  No nausea or vomiting.  Denies chest pain or shortness of breath.  No other concerns or complaints at this time.  Objective: Vitals:   05/10/22 1406 05/10/22 1437 05/10/22 1440 05/10/22 1443  BP: (!) 132/58 (!) 118/52 97/75 122/64  Pulse: 71 70 84 94  Resp: 12     Temp: 98.2 F (36.8 C)     TempSrc: Axillary     SpO2: 100% 100% 100%   Weight:      Height:        Intake/Output Summary (Last 24 hours) at 05/10/2022 1745 Last data filed at 05/10/2022 1617 Gross per 24 hour  Intake 3226.43 ml  Output 420 ml  Net 2806.43 ml   Filed Weights   05/09/22 1952  Weight: 43.8 kg   Examination: Physical Exam:  Constitutional: Thin cachectic chronically ill-appearing elderly African-American female in no acute distress appears calm  Respiratory: Diminished to auscultation bilaterally with coarse breath sounds, no wheezing, rales, rhonchi or crackles. Normal respiratory effort and patient is not tachypenic. No accessory muscle use.  Unlabored breathing Cardiovascular: RRR, no murmurs / rubs / gallops. S1 and S2 auscultated. No extremity edema Abdomen: Soft, non-tender, non-distended.Bowel sounds positive.  GU: Deferred. Musculoskeletal: No clubbing / cyanosis of digits/nails. No joint deformity upper and lower extremities.  Skin: No rashes, lesions, ulcers on limited skin evaluation. No induration; Warm and dry.  Neurologic: CN 2-12 grossly intact with no focal deficits. Romberg sign and cerebellar reflexes not assessed.  Psychiatric: Normal judgment and insight. Alert and oriented x 3. Normal mood and appropriate affect.   Data Reviewed: I have personally reviewed following labs and imaging studies  CBC: Recent Labs  Lab 05/09/22 1629 05/10/22 0011 05/10/22 0531  WBC 6.8 6.6 8.0  NEUTROABS 5.9  --  5.0  HGB 11.1* 9.9* 10.8*   HCT 34.1* 30.7* 32.5*  MCV 96.1 96.5 93.7  PLT 191 174 294   Basic Metabolic Panel: Recent Labs  Lab 05/09/22 1629 05/10/22 0011 05/10/22 0531  NA 136  --  138  K 3.7  --  3.9  CL 101  --  103  CO2 30  --  26  GLUCOSE 191*  --  209*  BUN 22  --  22  CREATININE 0.90 0.61 0.75  CALCIUM 9.9  --  9.5  MG  --   --  1.8  PHOS  --   --  2.9   GFR:  Estimated Creatinine Clearance: 34.9 mL/min (by C-G formula based on SCr of 0.75 mg/dL). Liver Function Tests: Recent Labs  Lab 05/09/22 1629 05/10/22 0531  AST 51* 47*  ALT 33 31  ALKPHOS 74 76  BILITOT 0.4 0.4  PROT 6.5 6.0*  ALBUMIN 3.1* 2.8*   Recent Labs  Lab 05/09/22 1629  LIPASE 119*   No results for input(s): "AMMONIA" in the last 168 hours. Coagulation Profile: No results for input(s): "INR", "PROTIME" in the last 168 hours. Cardiac Enzymes: No results for input(s): "CKTOTAL", "CKMB", "CKMBINDEX", "TROPONINI" in the last 168 hours. BNP (last 3 results) No results for input(s): "PROBNP" in the last 8760 hours. HbA1C: Recent Labs    05/10/22 0531  HGBA1C 8.9*   CBG: Recent Labs  Lab 05/09/22 2053 05/10/22 0133 05/10/22 0745 05/10/22 1142 05/10/22 1707  GLUCAP 175* 187* 208* 212* 245*   Lipid Profile: No results for input(s): "CHOL", "HDL", "LDLCALC", "TRIG", "CHOLHDL", "LDLDIRECT" in the last 72 hours. Thyroid Function Tests: Recent Labs    05/09/22 1629  TSH 0.022*  FREET4 2.60*   Anemia Panel: No results for input(s): "VITAMINB12", "FOLATE", "FERRITIN", "TIBC", "IRON", "RETICCTPCT" in the last 72 hours. Sepsis Labs: Recent Labs  Lab 05/09/22 1629 05/09/22 1945 05/10/22 0531  LATICACIDVEN 3.5* 2.4* 1.2    Recent Results (from the past 240 hour(s))  Resp Panel by RT-PCR (Flu A&B, Covid) Anterior Nasal Swab     Status: None   Collection Time: 05/09/22  5:58 PM   Specimen: Anterior Nasal Swab  Result Value Ref Range Status   SARS Coronavirus 2 by RT PCR NEGATIVE NEGATIVE Final     Comment: (NOTE) SARS-CoV-2 target nucleic acids are NOT DETECTED.  The SARS-CoV-2 RNA is generally detectable in upper respiratory specimens during the acute phase of infection. The lowest concentration of SARS-CoV-2 viral copies this assay can detect is 138 copies/mL. A negative result does not preclude SARS-Cov-2 infection and should not be used as the sole basis for treatment or other patient management decisions. A negative result may occur with  improper specimen collection/handling, submission of specimen other than nasopharyngeal swab, presence of viral mutation(s) within the areas targeted by this assay, and inadequate number of viral copies(<138 copies/mL). A negative result must be combined with clinical observations, patient history, and epidemiological information. The expected result is Negative.  Fact Sheet for Patients:  EntrepreneurPulse.com.au  Fact Sheet for Healthcare Providers:  IncredibleEmployment.be  This test is no t yet approved or cleared by the [place] FDA and  has been authorized for detection and/or diagnosis of SARS-CoV-2 by FDA under an Emergency Use Authorization (EUA). This EUA will remain  in effect (meaning this test can be used) for the duration of the COVID-19 declaration under Section 564(b)(1) of the Act, 21 U.S.C.section 360bbb-3(b)(1), unless the authorization is terminated  or revoked sooner.       Influenza A by PCR NEGATIVE NEGATIVE Final   Influenza B by PCR NEGATIVE NEGATIVE Final    Comment: (NOTE) The Xpert Xpress SARS-CoV-2/FLU/RSV plus assay is intended as an aid in the diagnosis of influenza from Nasopharyngeal swab specimens and should not be used as a sole basis for treatment. Nasal washings and aspirates are unacceptable for Xpert Xpress SARS-CoV-2/FLU/RSV testing.  Fact Sheet for Patients: EntrepreneurPulse.com.au  Fact Sheet for Healthcare  Providers: IncredibleEmployment.be  This test is not yet approved or cleared by the [place] FDA and has been authorized for detection and/or diagnosis of SARS-CoV-2 by FDA under an Emergency Use  Authorization (EUA). This EUA will remain in effect (meaning this test can be used) for the duration of the COVID-19 declaration under Section 564(b)(1) of the Act, 21 U.S.C. section 360bbb-3(b)(1), unless the authorization is terminated or revoked.  Performed at [facility], [place] [address], [place], [place] [address]     Radiology Studies: ECHOCARDIOGRAM COMPLETE  Result Date: 05/10/2022    ECHOCARDIOGRAM REPORT   Patient Name:   [name] [name] Date of Exam: 05/10/2022 Medical Rec #:  [id number]       Height:       65.0 in Accession #:    [id number]      Weight:       96.6 lb Date of Birth:  1934/04/24       BSA:          1.452 m Patient Age:    80 years        BP:           155/74 mmHg Patient Gender: F               HR:           72 bpm. Exam Location:  Inpatient Procedure: 2D Echo Indications:    Syncope  History:        Patient has prior history of Echocardiogram examinations, most                 recent 12/25/2021. Risk Factors:Hypertension and Dyslipidemia.  Sonographer:    Harvie Junior Referring Phys: [id number] Jina Olenick LATIF Dexter  1. Left ventricular ejection fraction, by estimation, is 65 to 70%. The left ventricle has normal function. The left ventricle has no regional wall motion abnormalities. There is mild asymmetric left ventricular hypertrophy of the basal-septal segment. Left ventricular diastolic parameters are indeterminate.  2. Right ventricular systolic function is normal. The right ventricular size is normal. There is normal pulmonary artery systolic pressure. The estimated right ventricular systolic pressure is 27.7 mmHg.  3. The mitral valve is abnormal. Trivial mitral valve regurgitation.  4. The aortic valve is tricuspid. Aortic  valve regurgitation is not visualized. Aortic valve sclerosis/calcification is present, without any evidence of aortic stenosis.  5. The inferior vena cava is normal in size with greater than 50% respiratory variability, suggesting right atrial pressure of 3 mmHg. FINDINGS  Left Ventricle: Left ventricular ejection fraction, by estimation, is 65 to 70%. The left ventricle has normal function. The left ventricle has no regional wall motion abnormalities. The left ventricular internal cavity size was normal in size. There is  mild asymmetric left ventricular hypertrophy of the basal-septal segment. Left ventricular diastolic parameters are indeterminate. Right Ventricle: The right ventricular size is normal. No increase in right ventricular wall thickness. Right ventricular systolic function is normal. There is normal pulmonary artery systolic pressure. The tricuspid regurgitant velocity is 2.67 m/s, and  with an assumed right atrial pressure of 3 mmHg, the estimated right ventricular systolic pressure is 82.4 mmHg. Left Atrium: Left atrial size was normal in size. Right Atrium: Right atrial size was normal in size. Pericardium: There is no evidence of pericardial effusion. Mitral Valve: The mitral valve is abnormal. Trivial mitral valve regurgitation. Tricuspid Valve: The tricuspid valve is normal in structure. Tricuspid valve regurgitation is trivial. Aortic Valve: The aortic valve is tricuspid. Aortic valve regurgitation is not visualized. Aortic valve sclerosis/calcification is present, without any evidence of aortic stenosis. Aortic valve mean gradient measures 4.0 mmHg. Aortic valve peak gradient measures 6.9  mmHg. Aortic valve area, by VTI measures 3.01 cm. Pulmonic Valve: The pulmonic valve was not well visualized. Pulmonic valve regurgitation is not visualized. Aorta: The aortic root is normal in size and structure. Venous: The inferior vena cava is normal in size with greater than 50% respiratory  variability, suggesting right atrial pressure of 3 mmHg. IAS/Shunts: The interatrial septum was not well visualized.  LEFT VENTRICLE PLAX 2D LVIDd:         4.10 cm     Diastology LVIDs:         2.80 cm     LV e' medial:    5.00 cm/s LV PW:         0.80 cm     LV E/e' medial:  15.9 LV IVS:        0.90 cm     LV e' lateral:   6.53 cm/s LVOT diam:     2.05 cm     LV E/e' lateral: 12.1 LV SV:         83 LV SV Index:   57 LVOT Area:     3.30 cm  LV Volumes (MOD) LV vol d, MOD A2C: 49.4 ml LV vol d, MOD A4C: 59.6 ml LV vol s, MOD A2C: 25.3 ml LV vol s, MOD A4C: 23.9 ml LV SV MOD A2C:     24.1 ml LV SV MOD A4C:     59.6 ml LV SV MOD BP:      29.7 ml RIGHT VENTRICLE RV Basal diam:  3.40 cm RV Mid diam:    3.00 cm RV S prime:     11.90 cm/s TAPSE (M-mode): 2.7 cm LEFT ATRIUM           Index        RIGHT ATRIUM           Index LA diam:      2.40 cm 1.65 cm/m   RA Area:     11.90 cm LA Vol (A4C): 32.0 ml 22.04 ml/m  RA Volume:   26.40 ml  18.19 ml/m  AORTIC VALVE                    PULMONIC VALVE AV Area (Vmax):    2.72 cm     PV Vmax:       0.96 m/s AV Area (Vmean):   2.54 cm     PV Peak grad:  3.7 mmHg AV Area (VTI):     3.01 cm AV Vmax:           131.00 cm/s AV Vmean:          97.700 cm/s AV VTI:            0.276 m AV Peak Grad:      6.9 mmHg AV Mean Grad:      4.0 mmHg LVOT Vmax:         108.00 cm/s LVOT Vmean:        75.300 cm/s LVOT VTI:          0.251 m LVOT/AV VTI ratio: 0.91  AORTA Ao Root diam: 3.00 cm Ao Asc diam:  3.00 cm MITRAL VALVE                TRICUSPID VALVE MV Area (PHT): 3.17 cm     TR Peak grad:   28.5 mmHg MV Decel Time: 239 msec     TR Vmax:        267.00 cm/s MR Peak  grad: 62.1 mmHg MR Vmax:      394.00 cm/s   SHUNTS MV E velocity: 79.30 cm/s   Systemic VTI:  0.25 m MV A velocity: 100.00 cm/s  Systemic Diam: 2.05 cm MV E/A ratio:  0.79 Oswaldo Milian MD Electronically signed by Oswaldo Milian MD Signature Date/Time: 05/10/2022/2:18:57 PM    Final    CT Head Wo Contrast  Result  Date: 05/09/2022 CLINICAL DATA:  Altered mental status. EXAM: CT HEAD WITHOUT CONTRAST TECHNIQUE: Contiguous axial images were obtained from the base of the skull through the vertex without intravenous contrast. RADIATION DOSE REDUCTION: This exam was performed according to the departmental dose-optimization program which includes automated exposure control, adjustment of the mA and/or kV according to patient size and/or use of iterative reconstruction technique. COMPARISON:  11/21/2021 FINDINGS: Brain: No evidence of intracranial hemorrhage, acute infarction, hydrocephalus, extra-axial collection, or mass lesion/mass effect. Stable moderate cerebral atrophy and chronic small vessel disease. Vascular:  No hyperdense vessel or other acute findings. Skull: No evidence of fracture or other significant bone abnormality. Sinuses/Orbits:  No acute findings. Other: None. IMPRESSION: No acute intracranial abnormality. Stable cerebral atrophy and chronic small vessel disease. Electronically Signed   By: Marlaine Hind M.D.   On: 05/09/2022 18:49   CT ABDOMEN PELVIS W CONTRAST  Result Date: 05/09/2022 CLINICAL DATA:  Right lower quadrant pain. EXAM: CT ABDOMEN AND PELVIS WITH CONTRAST TECHNIQUE: Multidetector CT imaging of the abdomen and pelvis was performed using the standard protocol following bolus administration of intravenous contrast. RADIATION DOSE REDUCTION: This exam was performed according to the departmental dose-optimization program which includes automated exposure control, adjustment of the mA and/or kV according to patient size and/or use of iterative reconstruction technique. CONTRAST:  171m OMNIPAQUE IOHEXOL 300 MG/ML  SOLN COMPARISON:  08/18/2019 FINDINGS: Lower Chest: No acute findings. Hepatobiliary: No hepatic masses identified. Gallbladder is unremarkable. No evidence of biliary ductal dilatation. Pancreas:  No mass or inflammatory changes. Spleen: Within normal limits in size and appearance.  Adrenals/Urinary Tract: No suspicious masses identified. No evidence of ureteral calculi or hydronephrosis. Stomach/Bowel: No evidence of obstruction, inflammatory process or abnormal fluid collections. Although the appendix is not directly visualized, no inflammatory process seen in region of the cecum or elsewhere. Vascular/Lymphatic: No pathologically enlarged lymph nodes. No acute vascular findings. Aortic atherosclerotic calcification incidentally noted. Reproductive: Prior hysterectomy noted. Adnexal regions are unremarkable in appearance. Other:  None. Musculoskeletal:  No suspicious bone lesions identified. IMPRESSION: No acute findings within the abdomen or pelvis. Aortic Atherosclerosis (ICD10-I70.0). Electronically Signed   By: JMarlaine HindM.D.   On: 05/09/2022 18:46   DG Chest 2 View  Result Date: 05/09/2022 CLINICAL DATA:  Cough. EXAM: CHEST - 2 VIEW COMPARISON:  01/16/2022 FINDINGS: The heart size and mediastinal contours are within normal limits. Cardiac loop recorder again noted in anterior chest wall. Aortic atherosclerotic calcification incidentally noted. Both lungs are clear. The visualized skeletal structures are unremarkable. IMPRESSION: No active cardiopulmonary disease. Electronically Signed   By: JMarlaine HindM.D.   On: 05/09/2022 17:06    Scheduled Meds:  amLODipine  2.5 mg Oral Daily   betamethasone dipropionate  1 application  Topical BID   cephALEXin  500 mg Oral TID   cholecalciferol  10,000 Units Oral Daily   enoxaparin (LOVENOX) injection  30 mg Subcutaneous Q24H   famotidine  40 mg Oral QHS   folic acid  1 mg Oral QHS   insulin aspart  0-5 Units Subcutaneous QHS   insulin aspart  0-9 Units Subcutaneous TID WC   insulin detemir  3 Units Subcutaneous QHS   [START ON 05/11/2022] insulin detemir  7 Units Subcutaneous Daily   levETIRAcetam  500 mg Oral BID   levothyroxine  100 mcg Oral Q0600   loratadine  10 mg Oral Daily   losartan  50 mg Oral Daily   [START ON  05/17/2022] methotrexate  20 mg Oral Weekly   metoprolol tartrate  25 mg Oral BID   multivitamins with iron  1 tablet Oral Daily   nystatin  5 mL Oral QID   predniSONE  15 mg Oral Q breakfast   Followed by   Derrill Memo ON 05/15/2022] predniSONE  10 mg Oral Q breakfast   Followed by   Derrill Memo ON 05/22/2022] predniSONE  5 mg Oral Q breakfast   Followed by   Derrill Memo ON 05/29/2022] predniSONE  2.5 mg Oral Q breakfast   Continuous Infusions:  sodium chloride 50 mL/hr at 05/10/22 1617    LOS: 0 days   Raiford Noble, DO [facility] Available via Epic secure chat 7am-7pm After these hours, please refer to coverage provider listed on [email or web address] 05/10/2022, 5:45 PM

## 2022-05-10 NOTE — Consult Note (Signed)
Central Nurse Consult Note: Reason for Consult:left arm skin tear and stage 2 pressure injury to sacrum, POA Wound type: Pressure plus moisture (sacrum), trauma (left arm skin tear sustained during fall) Pressure Injury POA: Yes Measurement: Sacrum: 3cm round x 0.1cm with pink moist wound bed and scant serous drainage Left arm skin tear to be measured by Bedside RN with next dressing change Wound bed:As noted above Drainage (amount, consistency, odor) As noted above Periwound:intact, dry, fragile Dressing procedure/placement/frequency: I have implemented the standing skin care order set for these two wounds with topical care including cleansing, patting dry, covering the lesions with folded layers of xeroform (antimicrobial nonadherent) gauze followed by securement with Kerlix roll gauze (arm) or a silicone foam dressing (sacrum). Turning and repositioning is in place; I have added guidance to minimize tie in the supine position. Bilateral pressure redistribution heel boots are provided. DermaTherapy bed linen system with a low friction coefficient (CoF) is in place.   [facility] nursing team will not follow, but will remain available to this patient, the nursing and medical teams.  Please re-consult if needed.  Thank you for inviting [place] to participate in this patient's Plan of Care.  Maudie Flakes, MSN, RN, CNS, [place], Serita Grammes, [employer], [employer] phone:  [phone]

## 2022-05-10 NOTE — Progress Notes (Signed)
Daughter brought cream in from home. She does not wish to follow hospital instructions for wound care. Stated she has everything from home and will do wound care according to pt's wound care team, who treats her bi-monthly. Daughter request nursing to not do cream, as they wish to continue their methods.

## 2022-05-10 NOTE — Evaluation (Signed)
Clinical/Bedside Swallow Evaluation Patient Details  Name: [name] [name] MRN: [id number] Date of Birth: 1934-03-13  Today's Date: 05/10/2022 Time: SLP Start Time (ACUTE ONLY): 4 SLP Stop Time (ACUTE ONLY): 5883 SLP Time Calculation (min) (ACUTE ONLY): 15 min  Past Medical History:  Past Medical History:  Diagnosis Date   Arthritis    "knees, legs" (06/10/2016)   Ascites    Chronic kidney disease    "related to my diabetes"   Chronic lower back pain    Diabetes mellitus without complication (HCC)    Eczema    GERD (gastroesophageal reflux disease)    High cholesterol    Hyperlipidemia    Hypertension    Hypothyroidism    OAB (overactive bladder)    Osteoporosis    Thyroid disease    Type II diabetes mellitus ([place])    Urticaria    Past Surgical History:  Past Surgical History:  Procedure Laterality Date   ABDOMINAL HYSTERECTOMY     KNEE ARTHROSCOPY     LAPAROTOMY N/A 12/03/2016   Procedure: EXPLORATORY LAPAROTOMY, LYSIS OF ADHESIONS;  Surgeon: Armandina Gemma, MD;  Location: [facility];  Service: General;  Laterality: N/A;   LOOP RECORDER INSERTION N/A 08/23/2019   Procedure: LOOP RECORDER INSERTION;  Surgeon: Evans Lance, MD;  Location: [facility];  Service: Cardiovascular;  Laterality: N/A;   vocal cord polyps     HPI:  Patient is an 88 y.o. female with PMH: HTN, hypothyroidism, iron deficiency, near syncope s/p loop recorder implant. 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 When EMS arrived, patient lethargic and they could barely feel her radial pulse. In ED, workup was unremarkable except for lactic acidosis 3.5 and low TSH .222. She was started on a regular solids, nectar thick liquids diet by MD, awaiting SLP evaluation of swallow function.    Assessment / Plan /  Recommendation  Clinical Impression  Patient presenting with clinical s/s of what appears to be a pharyngeal phase dysphagia with mildly delayed coughing with cup sips of thin liquids (water). Patient admits to getting strangled with liquids at times and daughter confirms this. Patient was seen by this SLP back in February of this year for OP MBS which found swallow initiation delays with thin liquids but no aspiration. SLP recommends to continue with regular solids, nectar thick liquids and plan for MBS next date to determine if any change in swallow function. SLP Visit Diagnosis: Dysphagia, unspecified (R13.10)    Aspiration Risk  Mild aspiration risk    Diet Recommendation Regular;Nectar-thick liquid   Liquid Administration via: Cup Medication Administration: Whole meds with puree Supervision: Patient able to self feed Compensations: Slow rate;Small sips/bites Postural Changes: Seated upright at 90 degrees    Other  Recommendations Oral Care Recommendations: Oral care BID    Recommendations for follow up therapy are one component of a multi-disciplinary discharge planning process, led by the attending physician.  Recommendations may be updated based on patient status, additional functional criteria and insurance authorization.  Follow up Recommendations Follow physician's recommendations for discharge plan and follow up therapies      Assistance Recommended at Discharge Intermittent Supervision/Assistance  Functional Status Assessment Patient has had a recent decline in their functional status and demonstrates the ability to make significant improvements in function in a reasonable and predictable amount of time.  Frequency and Duration min 2x/week  1 week       Prognosis Prognosis for Safe Diet Advancement: Good Barriers to Reach Goals: Severity of deficits;Time post onset      Swallow Study   General Date of Onset: 05/09/22 HPI: Patient is an 88 y.o. female with PMH: HTN,  hypothyroidism, iron deficiency, near syncope s/p loop recorder implant. 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 When EMS arrived, patient lethargic and they could barely feel her radial pulse. In ED, workup was unremarkable except for lactic acidosis 3.5 and low TSH .222. She was started on a regular solids, nectar thick liquids diet by MD, awaiting SLP evaluation of swallow function. Type of Study: Bedside Swallow Evaluation Previous Swallow Assessment: OP MBS February 2023 Diet Prior to this Study: Regular;Nectar-thick liquids Temperature Spikes Noted: No Respiratory Status: Room air History of Recent Intubation: No Behavior/Cognition: Alert;Cooperative;Pleasant mood Oral Cavity Assessment: Within Functional Limits Oral Care Completed by SLP: No Oral Cavity - Dentition: Adequate natural dentition Vision: Functional for self-feeding Self-Feeding Abilities: Able to feed self Patient Positioning: Upright in bed Baseline Vocal Quality: Low vocal intensity;Normal Volitional Cough: Weak Volitional Swallow: Able to elicit    Oral/Motor/Sensory Function Overall Oral Motor/Sensory Function: Generalized oral weakness   Ice Chips     Thin Liquid Thin Liquid: Impaired Presentation: Cup;Self Fed Pharyngeal  Phase Impairments: Suspected delayed Swallow;Cough - Delayed    Nectar Thick     Honey Thick     Puree     Solid            Sonia Baller, MA, CCC-SLP Speech Therapy

## 2022-05-10 NOTE — Progress Notes (Signed)
  Echocardiogram 2D Echocardiogram has been performed.  [name] [name] 05/10/2022, 2:06 PM

## 2022-05-11 ENCOUNTER — Observation Stay (HOSPITAL_COMMUNITY): Payer: Medicare HMO

## 2022-05-11 ENCOUNTER — Ambulatory Visit (INDEPENDENT_AMBULATORY_CARE_PROVIDER_SITE_OTHER): Payer: Medicare HMO

## 2022-05-11 DIAGNOSIS — E440 Moderate protein-calorie malnutrition: Secondary | ICD-10-CM | POA: Diagnosis not present

## 2022-05-11 DIAGNOSIS — N179 Acute kidney failure, unspecified: Secondary | ICD-10-CM

## 2022-05-11 DIAGNOSIS — R55 Syncope and collapse: Secondary | ICD-10-CM

## 2022-05-11 LAB — CBC WITH DIFFERENTIAL/PLATELET
Abs Immature Granulocytes: 0.05 10*3/uL (ref 0.00–0.07)
Basophils Absolute: 0 10*3/uL (ref 0.0–0.1)
Basophils Relative: 0 %
Eosinophils Absolute: 0.5 10*3/uL (ref 0.0–0.5)
Eosinophils Relative: 6 %
HCT: 31.3 % — ABNORMAL LOW (ref 36.0–46.0)
Hemoglobin: 10.4 g/dL — ABNORMAL LOW (ref 12.0–15.0)
Immature Granulocytes: 1 %
Lymphocytes Relative: 13 %
Lymphs Abs: 1 10*3/uL (ref 0.7–4.0)
MCH: 31.2 pg (ref 26.0–34.0)
MCHC: 33.2 g/dL (ref 30.0–36.0)
MCV: 94 fL (ref 80.0–100.0)
Monocytes Absolute: 1.5 10*3/uL — ABNORMAL HIGH (ref 0.1–1.0)
Monocytes Relative: 19 %
Neutro Abs: 4.6 10*3/uL (ref 1.7–7.7)
Neutrophils Relative %: 61 %
Platelets: 183 10*3/uL (ref 150–400)
RBC: 3.33 MIL/uL — ABNORMAL LOW (ref 3.87–5.11)
RDW: 13.6 % (ref 11.5–15.5)
WBC: 7.2 10*3/uL (ref 4.0–10.5)
nRBC: 0 % (ref 0.0–0.2)

## 2022-05-11 LAB — CUP PACEART REMOTE DEVICE CHECK
Date Time Interrogation Session: 20231021195900
Implantable Pulse Generator Implant Date: 20210203

## 2022-05-11 LAB — COMPREHENSIVE METABOLIC PANEL
ALT: 30 U/L (ref 0–44)
AST: 48 U/L — ABNORMAL HIGH (ref 15–41)
Albumin: 2.7 g/dL — ABNORMAL LOW (ref 3.5–5.0)
Alkaline Phosphatase: 70 U/L (ref 38–126)
Anion gap: 7 (ref 5–15)
BUN: 29 mg/dL — ABNORMAL HIGH (ref 8–23)
CO2: 24 mmol/L (ref 22–32)
Calcium: 9 mg/dL (ref 8.9–10.3)
Chloride: 102 mmol/L (ref 98–111)
Creatinine, Ser: 1.41 mg/dL — ABNORMAL HIGH (ref 0.44–1.00)
GFR, Estimated: 36 mL/min — ABNORMAL LOW (ref >60)
Glucose, Bld: 380 mg/dL — ABNORMAL HIGH (ref 70–99)
Potassium: 4.4 mmol/L (ref 3.5–5.1)
Sodium: 133 mmol/L — ABNORMAL LOW (ref 135–145)
Total Bilirubin: 0.3 mg/dL (ref 0.3–1.2)
Total Protein: 5.6 g/dL — ABNORMAL LOW (ref 6.5–8.1)

## 2022-05-11 LAB — URINALYSIS, COMPLETE (UACMP) WITH MICROSCOPIC
Bacteria, UA: NONE SEEN
Bilirubin Urine: NEGATIVE
Glucose, UA: >=500 mg/dL — AB
Hgb urine dipstick: NEGATIVE
Ketones, ur: NEGATIVE mg/dL
Leukocytes,Ua: NEGATIVE
Nitrite: NEGATIVE
Protein, ur: 100 mg/dL — AB
Specific Gravity, Urine: 1.018 (ref 1.005–1.030)
pH: 5 (ref 5.0–8.0)

## 2022-05-11 LAB — GLUCOSE, CAPILLARY
Glucose-Capillary: 367 mg/dL — ABNORMAL HIGH (ref 70–99)
Glucose-Capillary: 397 mg/dL — ABNORMAL HIGH (ref 70–99)
Glucose-Capillary: 335 mg/dL — ABNORMAL HIGH (ref 70–99)
Glucose-Capillary: 280 mg/dL — ABNORMAL HIGH (ref 70–99)

## 2022-05-11 LAB — PHOSPHORUS: Phosphorus: 3.5 mg/dL (ref 2.5–4.6)

## 2022-05-11 LAB — MAGNESIUM: Magnesium: 1.7 mg/dL (ref 1.7–2.4)

## 2022-05-11 LAB — URINE CULTURE: Culture: NO GROWTH

## 2022-05-11 LAB — SODIUM, URINE, RANDOM: Sodium, Ur: 38 mmol/L

## 2022-05-11 LAB — CREATININE, URINE, RANDOM: Creatinine, Urine: 90 mg/dL

## 2022-05-11 MED ORDER — FAMOTIDINE 20 MG PO TABS
20.0000 mg | ORAL_TABLET | Freq: Every day | ORAL | Status: DC
  Administered 2022-05-11 – 2022-05-12 (×2): 20 mg via ORAL
  Filled 2022-05-11 (×2): qty 1

## 2022-05-11 MED ORDER — SODIUM CHLORIDE 0.9 % IV SOLN: INTRAVENOUS | Status: AC

## 2022-05-11 MED ORDER — HEPARIN SODIUM (PORCINE) 5000 UNIT/ML IJ SOLN
5000.0000 [IU] | Freq: Three times a day (TID) | INTRAMUSCULAR | Status: DC
  Administered 2022-05-12 – 2022-05-13 (×4): 5000 [IU] via SUBCUTANEOUS
  Filled 2022-05-11 (×4): qty 1

## 2022-05-11 MED ORDER — HEPARIN SODIUM (PORCINE) 5000 UNIT/ML IJ SOLN: 5000.0000 [IU] | Freq: Three times a day (TID) | INTRAMUSCULAR | Status: DC

## 2022-05-11 MED ORDER — GLUCERNA SHAKE PO LIQD
237.0000 mL | Freq: Three times a day (TID) | ORAL | Status: DC
  Administered 2022-05-11 – 2022-05-13 (×5): 237 mL via ORAL
  Filled 2022-05-11 (×7): qty 237

## 2022-05-11 NOTE — Progress Notes (Addendum)
OT Cancellation Note  Patient Details Name: [name] [name] MRN: [id number] DOB: 07-28-1933   Cancelled Treatment:    Reason Eval/Treat Not Completed: Other (comment) Patient unwilling to participate in therapy. She stated she has already walked today, and was too tired to do anything at this time. OT will follow and check back as schedule allows.   Charlann Lange 05/11/2022, 12:57 PM

## 2022-05-11 NOTE — Progress Notes (Signed)
Initial Nutrition Assessment  DOCUMENTATION CODES:   Non-severe (moderate) malnutrition in context of chronic illness, Underweight  INTERVENTION:   -Glucerna Shake po TID, each supplement provides 220 kcal and 10 grams of protein   -Changed diet to CHO modified diet with nectar thick liquids with meal order assist given pt with low sodium and no other indications for heart healthy diet.   NUTRITION DIAGNOSIS:   Moderate Malnutrition related to chronic illness as evidenced by mild fat depletion, moderate muscle depletion, percent weight loss.  GOAL:   Patient will meet greater than or equal to 90% of their needs  MONITOR:   PO intake, Supplement acceptance, Labs, Weight trends, I & O's  REASON FOR ASSESSMENT:   Consult Assessment of nutrition requirement/status  ASSESSMENT:   88 year old African-American female with a past medical history significant for near syncope status post loop recorder implantation, hypertension, hypothyroidism, history of iron deficiency anemia as well as other comorbidities who presented from her home with syncope x2.  Patient sitting in chair, sleeping. No family present at time of visit. Pt not a good historian, mainly states she is tired. Pt did not answer some of my questions.  Given low sodium labs, will change diet to just CHO modified with nectar thick liquids. Pt will likely benefit from meal order with assist to ensure she orders all meals.  Pt states she didn't eat today d/t being too tired.  Will order Glucerna shakes given poor PO, however feel that elevated CBGs are related to prednisone.  Per weight records, pt has lost 26 lbs since 7/4 (21% wt loss x 3.5 months, significant for time frame).  Medications: Vitamin D, folic acid, Multivitamin with minerals daily, Prednisone  Labs reviewed: CBGs: 208-397 Low Na   NUTRITION - FOCUSED PHYSICAL EXAM:  Flowsheet Row Most Recent Value  Orbital Region Mild depletion  Upper Arm Region  Mild depletion  Thoracic and Lumbar Region Unable to assess  Buccal Region Moderate depletion  Temple Region Moderate depletion  Clavicle Bone Region Moderate depletion  Clavicle and Acromion Bone Region Moderate depletion  Scapular Bone Region Moderate depletion  Dorsal Hand Moderate depletion  Patellar Region Unable to assess  Anterior Thigh Region Unable to assess  Posterior Calf Region Unable to assess  Edema (RD Assessment) None  Hair Unable to assess  [covered]  Eyes Reviewed  Mouth Reviewed  Skin Reviewed       Diet Order:   Diet Order             Diet Carb Modified Fluid consistency: Nectar Thick; Room service appropriate? Yes with Assist  Diet effective now                   EDUCATION NEEDS:   No education needs have been identified at this time  Skin:  Skin Assessment: Skin Integrity Issues: Skin Integrity Issues:: Stage II, Other (Comment) Stage II: medial sacrum Other: non-pressure wound on left elbow  Last BM:  10/22  Height:   Ht Readings from Last 1 Encounters:  05/09/22 '5\' 5"'$  (1.651 m)    Weight:   Wt Readings from Last 1 Encounters:  05/09/22 43.8 kg    BMI:  Body mass index is 16.07 kg/m.  Estimated Nutritional Needs:   Kcal:  1500-1700  Protein:  70-85g  Fluid:  1.7L/day  Clayton Bibles, MS, RD, LDN Inpatient Clinical Dietitian Contact information available via Amion

## 2022-05-11 NOTE — TOC Initial Note (Signed)
Transition of Care [facility]) - Initial/Assessment Note    Patient Details  Name: [name] [name] MRN: [id number] Date of Birth: 1934-04-11  Transition of Care [facility]) CM/SW Contact:    Leeroy Cha, RN Phone Number: 05/11/2022, 9:25 AM  Clinical Narrative:                  Transition of Care [facility]) Screening Note   Patient Details  Name: [name] [name] Date of Birth: 06/10/1934   Transition of Care [facility]) CM/SW Contact:    Leeroy Cha, RN Phone Number: 05/11/2022, 9:26 AM    Transition of Care Department [facility]) has reviewed patient and no TOC needs have been identified at this time. We will continue to monitor patient advancement through interdisciplinary progression rounds. If new patient transition needs arise, please place a TOC consult.    Expected Discharge Plan: Home/Self Care Barriers to Discharge: Continued Medical Work up   Patient Goals and CMS Choice Patient states their goals for this hospitalization and ongoing recovery are:: to go home CMS Medicare.gov Compare Post Acute Care list provided to:: Patient    Expected Discharge Plan and Services Expected Discharge Plan: Home/Self Care   Discharge Planning Services: CM Consult   Living arrangements for the past 2 months: Single Family Home                                      Prior Living Arrangements/Services Living arrangements for the past 2 months: Single Family Home Lives with:: Self Patient language and need for interpreter reviewed:: Yes Do you feel safe going back to the place where you live?: Yes            Criminal Activity/Legal Involvement Pertinent to Current Situation/Hospitalization: No - Comment as needed  Activities of Daily Living Home Assistive Devices/Equipment: Eyeglasses, Wheelchair, Environmental consultant (specify type), Dentures (specify type), CBG Meter, Cane (specify quad or straight), Hearing aid ADL Screening (condition at time of admission) Patient's cognitive ability  adequate to safely complete daily activities?: Yes Is the patient deaf or have difficulty hearing?: No Does the patient have difficulty seeing, even when wearing glasses/contacts?: No Does the patient have difficulty concentrating, remembering, or making decisions?: Yes Patient able to express need for assistance with ADLs?: Yes Does the patient have difficulty dressing or bathing?: Yes Independently performs ADLs?: No Communication: Needs assistance Is this a change from baseline?: Pre-admission baseline Dressing (OT): Needs assistance Is this a change from baseline?: Pre-admission baseline Grooming: Needs assistance Is this a change from baseline?: Pre-admission baseline Feeding: Needs assistance Is this a change from baseline?: Pre-admission baseline Bathing: Needs assistance Is this a change from baseline?: Pre-admission baseline Toileting: Needs assistance Is this a change from baseline?: Pre-admission baseline In/Out Bed: Needs assistance Is this a change from baseline?: Pre-admission baseline Walks in Home: Needs assistance Is this a change from baseline?: Pre-admission baseline Does the patient have difficulty walking or climbing stairs?: Yes Weakness of Legs: Both Weakness of Arms/Hands: Both  Permission Sought/Granted                  Emotional Assessment Appearance:: Appears stated age Attitude/Demeanor/Rapport: Engaged Affect (typically observed): Calm Orientation: : Oriented to Self, Oriented to Place, Oriented to  Time, Oriented to Situation Alcohol / Substance Use: Never Used Psych Involvement: No (comment)  Admission diagnosis:  Lactic acidosis [E87.20] Near syncope [R55] Decreased thyroid stimulating hormone (TSH) level [R79.89]  Patient Active Problem List   Diagnosis Date Noted   Near syncope 05/09/2022   Aspiration pneumonia ([place]) 01/19/2022   Fever 01/16/2022   Syncope 01/13/2022   Fever, unknown origin 01/13/2022   Elevated troponin 01/13/2022    COVID-19 virus infection 09/14/2021   Syncope and collapse 09/13/2021   Dermatopolymyositis, unspecified, organ involvement unspecified ([place]) 09/13/2021   Poor appetite 09/13/2021   Hyponatremia 09/13/2021   Uncontrolled diabetes mellitus with hypoglycemia without coma, with long-term current use of insulin ([place]) 09/13/2021   Sacral ulcer ([place]) 09/13/2021   Allergic conjunctivitis of both eyes 03/15/2020   PAD (peripheral artery disease) ([place]) 10/24/2019   Malnutrition ([place]) 10/10/2019   Seizures ([place]) with prolonged postictal period 29/56/2130   Acute metabolic encephalopathy 86/57/8469   Right shoulder pain 08/21/2019   Leukocytosis 08/21/2019   Acute encephalopathy 08/18/2019   Enterocolitis 08/18/2019   Hyperglycemia 08/18/2019   Chronic kidney disease    Pressure injury of skin 12/09/2016   SBO (small bowel obstruction) s/p lysis of adhesions 12/03/2016 12/04/2016   Acute urinary retention 11/27/2016   Ascites 11/27/2016   Abdominal distension    Facial droop    Hyperkalemia 11/25/2016   Sepsis ([place]) 11/25/2016   Acute renal failure superimposed on stage 2 chronic kidney disease ([place]) 11/25/2016   Delirium 11/25/2016   Age-related osteoporosis without current pathological fracture 10/01/2016   Hyperlipidemia 06/10/2016   GERD (gastroesophageal reflux disease) 06/10/2016   Insomnia 06/10/2016   Viral gastroenteritis    Right hip pain 05/06/2015   Vaginal atrophy 11/01/2013   ECZEMA 07/24/2008   INTERMITTENT VERTIGO 07/24/2008   Constipation 11/16/2007   INSOMNIA 08/12/2007   Oral thrush 08/03/2007   Essential hypertension 02/14/2007   Allergic rhinitis due to allergen 02/14/2007   Hypothyroidism, acquired 01/26/2007   Type 2 diabetes mellitus without complication, with long-term current use of insulin ([place]) 01/26/2007   GERD 01/26/2007   DEGENERATIVE JOINT DISEASE 01/26/2007   PCP:  Lajean Manes, MD Pharmacy:   [facility] [phone] - [place], [place] [place] [address] Phone: [phone] Fax: [phone]     Social Determinants of Health (SDOH) Interventions    Readmission Risk Interventions   Row Labels 01/19/2022   11:11 AM  Readmission Risk Prevention Plan   Section Header. No data exists in this row.   Transportation Screening   Complete  PCP or Specialist Appt within 3-5 Days   Complete  HRI or [place]   Complete  Social Work Consult for [place] Planning/Counseling   Complete  Palliative Care Screening   Not Applicable  Medication Review Press [employer])   Complete

## 2022-05-11 NOTE — Progress Notes (Signed)
PROGRESS NOTE    [name] [name]  TIR:[id number] DOB: 03/13/1934 DOA: 05/09/2022 PCP: Lajean Manes, MD   Brief Narrative:  Patient is an 88 year old African-American female with a past medical history significant for near syncope status post loop recorder implantation, hypertension, hypothyroidism, history of iron deficiency anemia as well as other comorbidities who presented from her home with syncope x2.  This was associated with generalized weakness and per the daughter patient had a flare of her dermatomyositis and her home p.o. steroids were increased.  Patient states that she passed out on the toilet initially and then passed out sitting in the chair.  Her daughter was concerned that her dermatomyositis and steroids have made it caused her symptoms.  She has had difficulty swallowing thin liquids and there is also concern for aspiration.  She did have a cough last week or so and given her increased weakness and EMS was called and patient was lethargic and they could barely feel her radial pulse.  Was given a 200 cc bolus and brought to the ED for further evaluation.  In the ED she was noted to have lactic acidosis and a low TSH but remains on levothyroxine.  She was given 2 L of IV fluid boluses and admitted for further evaluation for syncope.  Initial labs show that her lactic acid was 3.5 and repeat was then 2.4.  Echocardiogram was ordered as well as PT and OT and showed no follow-up.  Overnight she developed an AKI so we will need to continue to hydrate and work-up further.  If AKI improves and she is stable possibly can be discharged home in the next 24 to 48 hours  Assessment and Plan:  Syncope -Had 2 episodes -Status post loop recorder implant.  Follow cardiology's interpretation of results. -Obtain orthostatic vital signs and she did drop from lying to sitting at 118/52 to 97/75 -Check echocardiogram and showed "Left ventricular ejection fraction, by estimation, is 65 to 70%. The left  ventricle has normal function. The left ventricle has no regional  wall motion abnormalities. There is mild asymmetric left ventricular hypertrophy of the basal-septal segment. Left ventricular diastolic parameters are indeterminate. Right ventricular systolic function is normal. The right ventricular size is normal. There is normal pulmonary artery systolic pressure. The estimated right ventricular systolic pressure is 67.6 mmHg. " -Initial troponin was 21 and repeat was 17 -Urinalysis was unremarkable urine culture still pending -Supportive care with gentle IV fluid hydration with gentle IV fluid hydration -Continue Fall precautions -We will order TED hose -PT OT to further evaluate and treat and recommending a follow-up -May need to discuss with cardiology given that she does have a loop recorder -Continue to monitor and will need to discuss with cardiology about the loop recorder finding   Lactic acidosis, unclear etiology patient is resolved -Presented with lactic acid 3.5 -> 2.4 -> 1.2 -No evidence of active infective process. -Chest x-ray nonacute, UA negative for pyuria. -Continue IV fluid hydration with normal saline at 50 MLS per hour -Repeat lactic acid has resolved   Hypothyroidism on home levothyroxine -Presenting TSH 0.022, free T4 pending -Reduced home levothyroxine dose to 100 mcg from 200 mcg for now. -Follow free T4 and was 2.60. -She will need repeat thyroid function studies within 4 weeks weeks   Type 2 diabetes with hyperglycemia -Hemoglobin A1c of 8.9 -Start insulin sliding scale -Continue monitor CBGs per protocol and they have been ranging from 280-397   Possible dysphagia to liquids? -Speech therapist evaluation done  and she had an MBS which found swallow initiation delayed with thin liquids but no aspiration.  SLP recommends to continue regular solids with nectar thick liquids and plan for MBS tomorrow to see if there is any change in her swallow function -The  patient was witnessed tolerating a solid diet fed by her daughter at bedside. -SLP evaluated and recommending dysphagia 3 diet with nectar thick liquids   Essential Hypertension -Resume home oral antihypertensives -IV hydralazine as needed with parameters. -Continue to monitor blood pressures per protocol and last blood pressure reading is now 157/61  AKI -Patient's BUN/Cr went from 22/0.75 -> 29/1.41 -Check Urinalysis, Urine Cr, Urine Osm, Urine Sodium and Renal U/S -Renal U/S No hydronephrosis. -Repeat urinalysis done showed greater than 500 glucose, 100 protein, urine specific gravity of 1.018, no bacteria seen, present on cath, present mucus, 0-5 RBCs per high-power field, 0-5 squamous epithelial cells and 6-10 WBCs -Continue gentle IV fluid hydration with normal saline at 50 MLS per hour -Avoid further nephrotoxic medications, contrast dyes, hypotension and dehydration to avoid renal hypoperfusion and will need to renally dose medications -Repeat CMP in a.m.  Physical debility -PT OT assessment recommending no follow up -Fall precautions.   Left arm tear, POA -Wound care Consult -Local wound care.   Slightly abnormal AST -Patient's AST is now 48 and ALT is now 30 -Continue monitor and trend and repeat CMP in a.m. and if necessary will obtain a right upper quadrant ultrasound and acute hepatitis panel if not improving   Severe Malnutrition/Underweight -Estimated body mass index is 16.07 kg/m as calculated from the following:   Height as of this encounter: '5\' 5"'$  (1.651 m).   Weight as of this encounter: 43.8 kg. -Consult nutrition for further evaluation -Nutrition Status: Nutrition Problem: Moderate Malnutrition Etiology: chronic illness Signs/Symptoms: mild fat depletion, moderate muscle depletion, percent weight loss Interventions: Glucerna shake, MVI, Liberalize Diet  DVT prophylaxis: enoxaparin (LOVENOX) injection 30 mg Start: 05/09/22 2200    Code Status: Full  Code Family Communication: Discussed with daughter at bedside  Disposition Plan:  Level of care: Progressive Status is: Observation The patient will require care spanning > 2 midnights and should be moved to inpatient because: She developed an AKI and is now being further worked up for this.   Consultants:  None  Procedures:  As delineated as above  Antimicrobials:  Anti-infectives (From admission, onward)    Start     Dose/Rate Route Frequency Ordered Stop   05/10/22 1000  cephALEXin (KEFLEX) capsule 500 mg        500 mg Oral 3 times daily 05/10/22 0130         Subjective: Seen and examined at bedside and she is doing fairly well sitting in the chair but renal function worsened so we will work her up.  She denies any complaints or chest pain.  Had no further syncopal episodes or did not feel dizzy.  No other concerns or points this time.  Objective: Vitals:   05/11/22 0809 05/11/22 1026 05/11/22 1120 05/11/22 2019  BP: (!) 180/93 (!) 141/74 (!) 150/62 (!) 157/61  Pulse:   82 88  Resp:   18 16  Temp:   98.2 F (36.8 C) 97.8 F (36.6 C)  TempSrc:   Oral Oral  SpO2:   100% 100%  Weight:      Height:        Intake/Output Summary (Last 24 hours) at 05/11/2022 2106 Last data filed at 05/11/2022 1736 Gross per 24  hour  Intake 1031.49 ml  Output 921 ml  Net 110.49 ml   Filed Weights   05/09/22 1952  Weight: 43.8 kg   Examination: Physical Exam:  Constitutional: Thin frail cachectic chronically ill-appearing African-American female currently no acute distress Respiratory: Diminished to auscultation bilaterally, no wheezing, rales, rhonchi or crackles. Normal respiratory effort and patient is not tachypenic. No accessory muscle use.  Unlabored breathing Cardiovascular: RRR, no murmurs / rubs / gallops. S1 and S2 auscultated. No extremity edema. Abdomen: Soft, non-tender, non-distended.  Bowel sounds positive.  GU: Deferred. Musculoskeletal: No clubbing / cyanosis of  digits/nails. No joint deformity upper and lower extremities. Skin: Has a skin tear.  No rashes or lesions on limited skin evaluation Neurologic: CN 2-12 grossly intact with no focal deficits.  Romberg sign and cerebellar reflexes not assessed.  Psychiatric: Normal judgment and insight. Alert and oriented x 3. Normal mood and appropriate affect.   Data Reviewed: I have personally reviewed following labs and imaging studies  CBC: Recent Labs  Lab 05/09/22 1629 05/10/22 0011 05/10/22 0531 05/11/22 0445  WBC 6.8 6.6 8.0 7.2  NEUTROABS 5.9  --  5.0 4.6  HGB 11.1* 9.9* 10.8* 10.4*  HCT 34.1* 30.7* 32.5* 31.3*  MCV 96.1 96.5 93.7 94.0  PLT 191 174 203 902   Basic Metabolic Panel: Recent Labs  Lab 05/09/22 1629 05/10/22 0011 05/10/22 0531 05/11/22 0445  NA 136  --  138 133*  K 3.7  --  3.9 4.4  CL 101  --  103 102  CO2 30  --  26 24  GLUCOSE 191*  --  209* 380*  BUN 22  --  22 29*  CREATININE 0.90 0.61 0.75 1.41*  CALCIUM 9.9  --  9.5 9.0  MG  --   --  1.8 1.7  PHOS  --   --  2.9 3.5   GFR: Estimated Creatinine Clearance: 19.8 mL/min (A) (by C-G formula based on SCr of 1.41 mg/dL (H)). Liver Function Tests: Recent Labs  Lab 05/09/22 1629 05/10/22 0531 05/11/22 0445  AST 51* 47* 48*  ALT 33 31 30  ALKPHOS 74 76 70  BILITOT 0.4 0.4 0.3  PROT 6.5 6.0* 5.6*  ALBUMIN 3.1* 2.8* 2.7*   Recent Labs  Lab 05/09/22 1629  LIPASE 119*   No results for input(s): "AMMONIA" in the last 168 hours. Coagulation Profile: No results for input(s): "INR", "PROTIME" in the last 168 hours. Cardiac Enzymes: No results for input(s): "CKTOTAL", "CKMB", "CKMBINDEX", "TROPONINI" in the last 168 hours. BNP (last 3 results) No results for input(s): "PROBNP" in the last 8760 hours. HbA1C: Recent Labs    05/10/22 0531  HGBA1C 8.9*   CBG: Recent Labs  Lab 05/10/22 2059 05/11/22 0739 05/11/22 1117 05/11/22 1738 05/11/22 2024  GLUCAP 357* 367* 397* 335* 280*   Lipid Profile: No  results for input(s): "CHOL", "HDL", "LDLCALC", "TRIG", "CHOLHDL", "LDLDIRECT" in the last 72 hours. Thyroid Function Tests: Recent Labs    05/09/22 1629  TSH 0.022*  FREET4 2.60*   Anemia Panel: No results for input(s): "VITAMINB12", "FOLATE", "FERRITIN", "TIBC", "IRON", "RETICCTPCT" in the last 72 hours. Sepsis Labs: Recent Labs  Lab 05/09/22 1629 05/09/22 1945 05/10/22 0531  LATICACIDVEN 3.5* 2.4* 1.2    Recent Results (from the past 240 hour(s))  Resp Panel by RT-PCR (Flu A&B, Covid) Anterior Nasal Swab     Status: None   Collection Time: 05/09/22  5:58 PM   Specimen: Anterior Nasal Swab  Result Value Ref  Range Status   SARS Coronavirus 2 by RT PCR NEGATIVE NEGATIVE Final    Comment: (NOTE) SARS-CoV-2 target nucleic acids are NOT DETECTED.  The SARS-CoV-2 RNA is generally detectable in upper respiratory specimens during the acute phase of infection. The lowest concentration of SARS-CoV-2 viral copies this assay can detect is 138 copies/mL. A negative result does not preclude SARS-Cov-2 infection and should not be used as the sole basis for treatment or other patient management decisions. A negative result may occur with  improper specimen collection/handling, submission of specimen other than nasopharyngeal swab, presence of viral mutation(s) within the areas targeted by this assay, and inadequate number of viral copies(<138 copies/mL). A negative result must be combined with clinical observations, patient history, and epidemiological information. The expected result is Negative.  Fact Sheet for Patients:  EntrepreneurPulse.com.au  Fact Sheet for Healthcare Providers:  IncredibleEmployment.be  This test is no t yet approved or cleared by the [place] FDA and  has been authorized for detection and/or diagnosis of SARS-CoV-2 by FDA under an Emergency Use Authorization (EUA). This EUA will remain  in effect (meaning this test  can be used) for the duration of the COVID-19 declaration under Section 564(b)(1) of the Act, 21 U.S.C.section 360bbb-3(b)(1), unless the authorization is terminated  or revoked sooner.       Influenza A by PCR NEGATIVE NEGATIVE Final   Influenza B by PCR NEGATIVE NEGATIVE Final    Comment: (NOTE) The Xpert Xpress SARS-CoV-2/FLU/RSV plus assay is intended as an aid in the diagnosis of influenza from Nasopharyngeal swab specimens and should not be used as a sole basis for treatment. Nasal washings and aspirates are unacceptable for Xpert Xpress SARS-CoV-2/FLU/RSV testing.  Fact Sheet for Patients: EntrepreneurPulse.com.au  Fact Sheet for Healthcare Providers: IncredibleEmployment.be  This test is not yet approved or cleared by the [place] FDA and has been authorized for detection and/or diagnosis of SARS-CoV-2 by FDA under an Emergency Use Authorization (EUA). This EUA will remain in effect (meaning this test can be used) for the duration of the COVID-19 declaration under Section 564(b)(1) of the Act, 21 U.S.C. section 360bbb-3(b)(1), unless the authorization is terminated or revoked.  Performed at [facility], [place] [address] [place], [place] [address]   Urine Culture     Status: None   Collection Time: 05/09/22  6:53 PM   Specimen: Urine, Clean Catch  Result Value Ref Range Status   Specimen Description   Final    URINE, CLEAN CATCH Performed at [facility], [place] [address], [place], [place] [address]    Special Requests   Final    NONE Performed at [facility], [place] [address] [place], [place] [address]    Culture   Final    NO GROWTH Performed at [facility], [place] [address] [place], [place] [address]    Report Status 05/11/2022 FINAL  Final    Radiology Studies: US RENAL  Result Date: 05/11/2022 CLINICAL DATA:  Acute kidney injury EXAM: RENAL / URINARY  TRACT ULTRASOUND COMPLETE COMPARISON:  CT abdomen and pelvis dated May 09, 2022 FINDINGS: Right Kidney: Renal measurements: 8.6 x 4.2 x 6.7 cm = volume: 128.7 mL. Echogenicity within normal limits. No mass or hydronephrosis visualized. Left Kidney: Renal measurements: 9.2 x 5.4 x 5.0 cm = volume: 127.9 mL. Echogenicity within normal limits. No mass or hydronephrosis visualized. Simple appearing cyst of the lower pole of the left kidney measuring 2.7 x 2.1 x 2.4  cm. Bladder: Appears normal for degree of bladder distention. Bilateral jets visualized. Other: Images suboptimal due to patient condition. IMPRESSION: No hydronephrosis. Electronically Signed   By: Yetta Glassman M.D.   On: 05/11/2022 15:26   DG Swallowing Func-Speech Pathology  Result Date: 05/11/2022 Table formatting from the original result was not included. Objective Swallowing Evaluation: Type of Study: MBS-Modified Barium Swallow Study  Patient Details Name: TAMIA DIAL MRN: [id number] Date of Birth: [birth year] Today's Date: 05/11/2022 Time: SLP Start Time (ACUTE ONLY): 1420 -SLP Stop Time (ACUTE ONLY): 1440 SLP Time Calculation (min) (ACUTE ONLY): 20 min Past Medical History: Past Medical History: Diagnosis Date  Arthritis   "knees, legs" (06/10/2016)  Ascites   Chronic kidney disease   "related to my diabetes"  Chronic lower back pain   Diabetes mellitus without complication (HCC)   Eczema   GERD (gastroesophageal reflux disease)   High cholesterol   Hyperlipidemia   Hypertension   Hypothyroidism   OAB (overactive bladder)   Osteoporosis   Thyroid disease   Type II diabetes mellitus ([place])   Urticaria  Past Surgical History: Past Surgical History: Procedure Laterality Date  ABDOMINAL HYSTERECTOMY    KNEE ARTHROSCOPY    LAPAROTOMY N/A 12/03/2016  Procedure: EXPLORATORY LAPAROTOMY, LYSIS OF ADHESIONS;  Surgeon: Armandina Gemma, MD;  Location: [facility];  Service: General;  Laterality: N/A;  LOOP RECORDER INSERTION N/A 08/23/2019  Procedure: LOOP  RECORDER INSERTION;  Surgeon: Evans Lance, MD;  Location: [facility];  Service: Cardiovascular;  Laterality: N/A;  vocal cord polyps   HPI: Patient is an 88 y.o. female with PMH: HTN, hypothyroidism, iron deficiency, near syncope s/p loop recorder implant. 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 When EMS arrived, patient lethargic and they could barely feel her radial pulse. In ED, workup was unremarkable except for lactic acidosis 3.5 and low TSH .222. She was started on a regular solids, nectar thick liquids diet by MD, awaiting SLP evaluation of swallow function.  Subjective: pleasant, awake and alert but per OT cancel note, patient refused OT session at 1257, stating she was tired  Recommendations for follow up therapy are one component of a multi-disciplinary discharge planning process, led by the attending physician.  Recommendations may be updated based on patient status, additional functional criteria and insurance authorization. Assessment / Plan / Recommendation   05/11/2022   3:02 PM Clinical Impressions Clinical Impression Patient presents with primary pharyngeal phase dysphagia as per this MBS. SLP tested her toleration of the following consistencies: thin, nectar thick, puree. Initially with thin liquids, patient's swallow appeared similar to presentation during February, 2023 MBS, however as testing continued, swallow appeared to decline. With first two sips of thin liquids, patient exhibited swallow initiation delays at level of pyriform sinus and trace flash penetration which did clear laryngeal vestibule, but by third sip of thin liquids, she exhibited deeper penetration above vocal cords which did not clear. Patient was able to clear penetrate several times with a cued cough/throat clear. As  evaluation progressed, patient started to exhibit silent aspiration of thin liquids during the swallow with penetration to the vocal cords occuring during and after the swallow. Chin tuck helped to reduce penetration intially, but when tried second time, patient had aspiration of thin liquids. Trace penetration but no aspiration occured with nectar thick liquids and no  penetration or aspiration occured with puree solids. All barium consistencies tested resulted in pharyngeal residuals, with puree solids exhibiting mild-mod vallecular residuals, nectar thick liquids exhibiting mild vallecular residuals and thin liquids exhibiting min vallecular and pyriform sinus residuals overall. (one instance of moderate amount of vallecular and mild-mod pyriform sinus residuals which cleared to min with second, dry swallow). Patient did not demonstrate adequate pharyngeal sensation as she did not notice when she had mild-moderate amount of barium contrast still in pharynx and she did not sense any aspiration or penetration events. At this time, SLP recommending continue nectar thick liquids with meals, solids can remain 'regular' as daughter orders her soft foods. Thin liquids can be consumed in between meals when patient sitting upright, fully awake and alert, no straws and with small cup sips. SLP will follow up to complete education with patient and daughter(s). SLP Visit Diagnosis Dysphagia, unspecified (R13.10) Impact on safety and function Mild aspiration risk;Moderate aspiration risk     05/11/2022   3:02 PM Treatment Recommendations Treatment Recommendations Therapy as outlined in treatment plan below     05/11/2022   3:16 PM Prognosis Prognosis for Safe Diet Advancement Fair Barriers to Reach Goals Time post onset   05/11/2022   3:02 PM Diet Recommendations SLP Diet Recommendations Regular solids;Dysphagia 3 (Mech soft) solids;Nectar thick liquid Liquid Administration via Cup;Straw Medication Administration Whole meds  with puree Compensations Slow rate;Small sips/bites     05/11/2022   3:02 PM Other Recommendations Oral Care Recommendations Oral care BID Follow Up Recommendations Follow physician's recommendations for discharge plan and follow up therapies Assistance recommended at discharge Intermittent Supervision/Assistance Functional Status Assessment Patient has had a recent decline in their functional status and demonstrates the ability to make significant improvements in function in a reasonable and predictable amount of time.   05/11/2022   3:02 PM Frequency and Duration  Speech Therapy Frequency (ACUTE ONLY) min 1 x/week Treatment Duration 1 week     05/11/2022   2:57 PM Oral Phase Oral Phase [facility]    05/11/2022   2:57 PM Pharyngeal Phase Pharyngeal Phase Impaired Pharyngeal- Nectar Cup Delayed swallow initiation-vallecula;Reduced laryngeal elevation;Reduced anterior laryngeal mobility;Penetration/Aspiration during swallow;Pharyngeal residue - valleculae;Pharyngeal residue - pyriform Pharyngeal Material enters airway, remains ABOVE vocal cords then ejected out Pharyngeal- Thin Cup Delayed swallow initiation-pyriform sinuses;Reduced laryngeal elevation;Reduced epiglottic inversion;Reduced tongue base retraction;Penetration/Aspiration during swallow;Pharyngeal residue - valleculae;Pharyngeal residue - pyriform Pharyngeal Material enters airway, passes BELOW cords without attempt by patient to eject out (silent aspiration);Material enters airway, CONTACTS cords and not ejected out Pharyngeal- Thin Straw Pharyngeal residue - valleculae;Pharyngeal residue - pyriform;Penetration/Aspiration during swallow;Reduced tongue base retraction;Reduced airway/laryngeal closure;Reduced laryngeal elevation;Reduced anterior laryngeal mobility;Delayed swallow initiation-pyriform sinuses Pharyngeal Material enters airway, passes BELOW cords without attempt by patient to eject out (silent aspiration);Material enters airway, CONTACTS cords and  not ejected out Pharyngeal- Puree Delayed swallow initiation-vallecula;Reduced anterior laryngeal mobility;Reduced tongue base retraction;Pharyngeal residue - valleculae Pharyngeal- Mechanical Soft NT    05/11/2022   3:00 PM Cervical Esophageal Phase  Cervical Esophageal Phase Impaired Cervical Esophageal Comment suspected cervical osteophytes (seen on February MBS) visualized, no radiologist present to confirm Sonia Baller, MA, CCC-SLP Speech Therapy                     ECHOCARDIOGRAM COMPLETE  Result Date: 05/10/2022    ECHOCARDIOGRAM REPORT   Patient Name:   BARBI KUMAGAI Date of Exam: 05/10/2022 Medical Rec #:  [id number]       Height:  65.0 in Accession #:    [id number]      Weight:       96.6 lb Date of Birth:  1933/07/29       BSA:          1.452 m Patient Age:    70 years        BP:           155/74 mmHg Patient Gender: F               HR:           72 bpm. Exam Location:  Inpatient Procedure: 2D Echo Indications:    Syncope  History:        Patient has prior history of Echocardiogram examinations, most                 recent 12/25/2021. Risk Factors:Hypertension and Dyslipidemia.  Sonographer:    Harvie Junior Referring Phys: [id number] Enedina Pair LATIF [place]  1. Left ventricular ejection fraction, by estimation, is 65 to 70%. The left ventricle has normal function. The left ventricle has no regional wall motion abnormalities. There is mild asymmetric left ventricular hypertrophy of the basal-septal segment. Left ventricular diastolic parameters are indeterminate.  2. Right ventricular systolic function is normal. The right ventricular size is normal. There is normal pulmonary artery systolic pressure. The estimated right ventricular systolic pressure is 08.6 mmHg.  3. The mitral valve is abnormal. Trivial mitral valve regurgitation.  4. The aortic valve is tricuspid. Aortic valve regurgitation is not visualized. Aortic valve sclerosis/calcification is present, without any evidence of aortic  stenosis.  5. The inferior vena cava is normal in size with greater than 50% respiratory variability, suggesting right atrial pressure of 3 mmHg. FINDINGS  Left Ventricle: Left ventricular ejection fraction, by estimation, is 65 to 70%. The left ventricle has normal function. The left ventricle has no regional wall motion abnormalities. The left ventricular internal cavity size was normal in size. There is  mild asymmetric left ventricular hypertrophy of the basal-septal segment. Left ventricular diastolic parameters are indeterminate. Right Ventricle: The right ventricular size is normal. No increase in right ventricular wall thickness. Right ventricular systolic function is normal. There is normal pulmonary artery systolic pressure. The tricuspid regurgitant velocity is 2.67 m/s, and  with an assumed right atrial pressure of 3 mmHg, the estimated right ventricular systolic pressure is 57.8 mmHg. Left Atrium: Left atrial size was normal in size. Right Atrium: Right atrial size was normal in size. Pericardium: There is no evidence of pericardial effusion. Mitral Valve: The mitral valve is abnormal. Trivial mitral valve regurgitation. Tricuspid Valve: The tricuspid valve is normal in structure. Tricuspid valve regurgitation is trivial. Aortic Valve: The aortic valve is tricuspid. Aortic valve regurgitation is not visualized. Aortic valve sclerosis/calcification is present, without any evidence of aortic stenosis. Aortic valve mean gradient measures 4.0 mmHg. Aortic valve peak gradient measures 6.9 mmHg. Aortic valve area, by VTI measures 3.01 cm. Pulmonic Valve: The pulmonic valve was not well visualized. Pulmonic valve regurgitation is not visualized. Aorta: The aortic root is normal in size and structure. Venous: The inferior vena cava is normal in size with greater than 50% respiratory variability, suggesting right atrial pressure of 3 mmHg. IAS/Shunts: The interatrial septum was not well visualized.  LEFT  VENTRICLE PLAX 2D LVIDd:         4.10 cm     Diastology LVIDs:         2.80 cm  LV e' medial:    5.00 cm/s LV PW:         0.80 cm     LV E/e' medial:  15.9 LV IVS:        0.90 cm     LV e' lateral:   6.53 cm/s LVOT diam:     2.05 cm     LV E/e' lateral: 12.1 LV SV:         83 LV SV Index:   57 LVOT Area:     3.30 cm  LV Volumes (MOD) LV vol d, MOD A2C: 49.4 ml LV vol d, MOD A4C: 59.6 ml LV vol s, MOD A2C: 25.3 ml LV vol s, MOD A4C: 23.9 ml LV SV MOD A2C:     24.1 ml LV SV MOD A4C:     59.6 ml LV SV MOD BP:      29.7 ml RIGHT VENTRICLE RV Basal diam:  3.40 cm RV Mid diam:    3.00 cm RV S prime:     11.90 cm/s TAPSE (M-mode): 2.7 cm LEFT ATRIUM           Index        RIGHT ATRIUM           Index LA diam:      2.40 cm 1.65 cm/m   RA Area:     11.90 cm LA Vol (A4C): 32.0 ml 22.04 ml/m  RA Volume:   26.40 ml  18.19 ml/m  AORTIC VALVE                    PULMONIC VALVE AV Area (Vmax):    2.72 cm     PV Vmax:       0.96 m/s AV Area (Vmean):   2.54 cm     PV Peak grad:  3.7 mmHg AV Area (VTI):     3.01 cm AV Vmax:           131.00 cm/s AV Vmean:          97.700 cm/s AV VTI:            0.276 m AV Peak Grad:      6.9 mmHg AV Mean Grad:      4.0 mmHg LVOT Vmax:         108.00 cm/s LVOT Vmean:        75.300 cm/s LVOT VTI:          0.251 m LVOT/AV VTI ratio: 0.91  AORTA Ao Root diam: 3.00 cm Ao Asc diam:  3.00 cm MITRAL VALVE                TRICUSPID VALVE MV Area (PHT): 3.17 cm     TR Peak grad:   28.5 mmHg MV Decel Time: 239 msec     TR Vmax:        267.00 cm/s MR Peak grad: 62.1 mmHg MR Vmax:      394.00 cm/s   SHUNTS MV E velocity: 79.30 cm/s   Systemic VTI:  0.25 m MV A velocity: 100.00 cm/s  Systemic Diam: 2.05 cm MV E/A ratio:  0.79 Oswaldo Milian MD Electronically signed by Oswaldo Milian MD Signature Date/Time: 05/10/2022/2:18:57 PM    Final     Scheduled Meds:  amLODipine  2.5 mg Oral Daily   betamethasone dipropionate  1 application  Topical BID   cephALEXin  500 mg Oral TID    cholecalciferol  10,000 Units Oral Daily  enoxaparin (LOVENOX) injection  30 mg Subcutaneous Q24H   famotidine  20 mg Oral QHS   feeding supplement (GLUCERNA SHAKE)  237 mL Oral TID BM   folic acid  1 mg Oral QHS   insulin aspart  0-5 Units Subcutaneous QHS   insulin aspart  0-9 Units Subcutaneous TID WC   insulin detemir  3 Units Subcutaneous QHS   insulin detemir  7 Units Subcutaneous Daily   levETIRAcetam  500 mg Oral BID   levothyroxine  100 mcg Oral Q0600   loratadine  10 mg Oral Daily   losartan  50 mg Oral Daily   [START ON 05/17/2022] methotrexate  20 mg Oral Weekly   metoprolol tartrate  25 mg Oral BID   multivitamins with iron  1 tablet Oral Daily   nystatin  5 mL Oral QID   predniSONE  15 mg Oral Q breakfast   Followed by   Derrill Memo ON 05/15/2022] predniSONE  10 mg Oral Q breakfast   Followed by   Derrill Memo ON 05/22/2022] predniSONE  5 mg Oral Q breakfast   Followed by   Derrill Memo ON 05/29/2022] predniSONE  2.5 mg Oral Q breakfast   Continuous Infusions:  sodium chloride 50 mL/hr at 05/11/22 1022    LOS: 0 days   Raiford Noble, DO [facility] Available via Epic secure chat 7am-7pm After these hours, please refer to coverage provider listed on [email or web address] 05/11/2022, 9:06 PM

## 2022-05-11 NOTE — Evaluation (Signed)
Physical Therapy Evaluation Patient Details Name: [name] [name] MRN: [id number] DOB: 31-Jan-1934 Today's Date: 05/11/2022  History of Present Illness  88 year old African-American female with a past medical history significant for near syncope status post loop recorder implantation, hypertension, hypothyroidism, history of iron deficiency anemia as well as other comorbidities who presented from her home with syncope x2.  This was associated with generalized weakness and per the daughter patient had a flare of her dermatomyositis and her home p.o. steroids were increased.  Clinical Impression  Pt admitted with above diagnosis.  Pt currently with functional limitations due to the deficits listed below (see PT Problem List). Pt will benefit from skilled PT to increase their independence and safety with mobility to allow discharge to the venue listed below.  Pt assisted with ambulating in hallway and reports weakness and fatigue.  HR 137 bpm upon sitting in recliner and SpO2 100% on room air.  [name] present and reports pt always has 24/7 assist at home.        Recommendations for follow up therapy are one component of a multi-disciplinary discharge planning process, led by the attending physician.  Recommendations may be updated based on patient status, additional functional criteria and insurance authorization.  Follow Up Recommendations No PT follow up      Assistance Recommended at Discharge PRN  Patient can return home with the following  A little help with walking and/or transfers;A little help with bathing/dressing/bathroom;Help with stairs or ramp for entrance    Equipment Recommendations None recommended by PT  Recommendations for Other Services       Functional Status Assessment Patient has had a recent decline in their functional status and demonstrates the ability to make significant improvements in function in a reasonable and predictable amount of time.     Precautions  / Restrictions Precautions Precautions: Fall Precaution Comments: monitor HR      Mobility  Bed Mobility               General bed mobility comments: pt in recliner    Transfers Overall transfer level: Needs assistance Equipment used: Rolling walker (2 wheels) Transfers: Sit to/from Stand Sit to Stand: Min guard           General transfer comment: multimodal cues for safe technique    Ambulation/Gait Ambulation/Gait assistance: Min guard, Min assist Gait Distance (Feet): 160 Feet Assistive device: Rolling walker (2 wheels) Gait Pattern/deviations: Step-through pattern, Decreased stride length, Trunk flexed, Drifts right/left Gait velocity: decr     General Gait Details: assist for negotiating obstacles, pt drifting in both directions, fatigued quickly; HR 137 bpm and SpO2 100% per dynamap (telemetry needed battery)  Stairs            Wheelchair Mobility    Modified Rankin (Stroke Patients Only)       Balance Overall balance assessment: Mild deficits observed, not formally tested                                           Pertinent Vitals/Pain Pain Assessment Pain Assessment: No/denies pain    Home Living Family/patient expects to be discharged to:: Private residence Living Arrangements: Children Available Help at Discharge: Family;Available 24 hours/day Type of Home: House Home Access: Stairs to enter;Ramped entrance Entrance Stairs-Rails: None Entrance Stairs-Number of Steps: 3+1   Home Layout: One level Home Equipment: Rollator (4 wheels);Wheelchair - [employer]  riser;Rolling Walker (2 wheels)      Prior Function Prior Level of Function : Needs assist       Physical Assist : Mobility (physical)     Mobility Comments: family assists with mobility for safety ADLs Comments: pt requires assist for bathing/dressing from daughters.     Hand Dominance        Extremity/Trunk Assessment        Lower  Extremity Assessment Lower Extremity Assessment: Generalized weakness    Cervical / Trunk Assessment Cervical / Trunk Assessment: Kyphotic  Communication   Communication: Expressive difficulties  Cognition Arousal/Alertness: Awake/alert Behavior During Therapy: WFL for tasks assessed/performed                                   General Comments: pt with dysarthric speech at times, occasional nonsensical statements, daughter in room and did not reports any changes in cognition        General Comments      Exercises     Assessment/Plan    PT Assessment Patient needs continued PT services  PT Problem List Decreased strength;Decreased balance;Decreased mobility;Decreased activity tolerance;Decreased knowledge of use of DME;Decreased safety awareness       PT Treatment Interventions Gait training;DME instruction;Therapeutic exercise;Functional mobility training;Therapeutic activities;Patient/family education;Neuromuscular re-education;Balance training    PT Goals (Current goals can be found in the Care Plan section)  Acute Rehab PT Goals PT Goal Formulation: With patient Time For Goal Achievement: 05/25/22 Potential to Achieve Goals: Good    Frequency Min 3X/week     Co-evaluation               AM-PAC PT "6 Clicks" Mobility  Outcome Measure Help needed turning from your back to your side while in a flat bed without using bedrails?: A Little Help needed moving from lying on your back to sitting on the side of a flat bed without using bedrails?: A Little Help needed moving to and from a bed to a chair (including a wheelchair)?: A Little Help needed standing up from a chair using your arms (e.g., wheelchair or bedside chair)?: A Little Help needed to walk in hospital room?: A Little Help needed climbing 3-5 steps with a railing? : A Lot 6 Click Score: 17    End of Session Equipment Utilized During Treatment: Gait belt Activity Tolerance: Patient  tolerated treatment well Patient left: in chair;with call bell/phone within reach;with family/visitor present Nurse Communication: Mobility status PT Visit Diagnosis: Difficulty in walking, not elsewhere classified (R26.2)    Time: 0950-1010 PT Time Calculation (min) (ACUTE ONLY): 20 min   Charges:   PT Evaluation $PT Eval Low Complexity: 1 Low        Kati PT, DPT Physical Therapist Acute Rehabilitation Services Preferred contact method: Secure Chat Weekend Pager Only: [phone] Office: [phone]   Myrtis Hopping [place] 05/11/2022, 2:41 PM

## 2022-05-11 NOTE — Inpatient Diabetes Management (Signed)
Inpatient Diabetes Program Recommendations  AACE/ADA: New Consensus Statement on Inpatient Glycemic Control (2015)  Target Ranges:  Prepandial:   less than 140 mg/dL      Peak postprandial:   less than 180 mg/dL (1-2 hours)      Critically ill patients:  140 - 180 mg/dL   Lab Results  Component Value Date   GLUCAP 367 (H) 05/11/2022   HGBA1C 8.9 (H) 05/10/2022    Review of Glycemic Control  Latest Reference Range & Units 05/10/22 11:42 05/10/22 17:07 05/10/22 20:59 05/11/22 07:39  Glucose-Capillary 70 - 99 mg/dL 212 (H) 245 (H) 357 (H) 367 (H)  (H): Data is abnormally high Diabetes history: Type 2 Dm Outpatient Diabetes medications: Novolog 2-10 units TID Current orders for Inpatient glycemic control: Levemir 7 units QD, Levemir 3 units QHS, Novolog 0-9 units & HS Prednisone 15 mg QD to taper   Inpatient Diabetes Program Recommendations:    In the setting of steroids, consider adding Novolog 3 units TID (assuming patient consuming >50% of meals).   Thanks, Bronson Curb, MSN, RNC-OB Diabetes Coordinator [phone] (8a-5p)

## 2022-05-11 NOTE — Progress Notes (Signed)
PHARMACY NOTE:  RENAL DOSAGE ADJUSTMENT  Renal Function: Estimated Creatinine Clearance: 19.8 mL/min (A) (by C-G formula based on SCr of 1.41 mg/dL (H)).   Famotidine 40 mg qhs reduced to famotidine 20 mg qhs  Eudelia Bunch, Pharm.D 05/11/2022 7:46 AM

## 2022-05-11 NOTE — Progress Notes (Signed)
Modified Barium Swallow Progress Note  Patient Details  Name: [name] [name] MRN: [id number] Date of Birth: Jun 10, 1934  Today's Date: 05/11/2022  Modified Barium Swallow completed.  Full report located under Chart Review in the Imaging Section.  Brief recommendations include the following:  Clinical Impression  Patient presents with primary pharyngeal phase dysphagia as per this MBS. SLP tested her toleration of the following consistencies: thin, nectar thick, puree. Initially with thin liquids, patient's swallow appeared similar to presentation during February, 2023 MBS, however as testing continued, swallow appeared to decline. With first two sips of thin liquids, patient exhibited swallow initiation delays at level of pyriform sinus and trace flash penetration which did clear laryngeal vestibule, but by third sip of thin liquids, she exhibited deeper penetration above vocal cords which did not clear. Patient was able to clear penetrate several times with a cued cough/throat clear. As evaluation progressed, patient started to exhibit silent aspiration of thin liquids during the swallow with penetration to the vocal cords occuring during and after the swallow. Chin tuck helped to reduce penetration intially, but when tried second time, patient had aspiration of thin liquids. Trace penetration but no aspiration occured with nectar thick liquids and no penetration or aspiration occured with puree solids. All barium consistencies tested resulted in pharyngeal residuals, with puree solids exhibiting mild-mod vallecular residuals, nectar thick liquids exhibiting mild vallecular residuals and thin liquids exhibiting min vallecular and pyriform sinus residuals overall. (one instance of moderate amount of vallecular and mild-mod pyriform sinus residuals which cleared to min with second, dry swallow). Patient did not demonstrate adequate pharyngeal sensation as she did not notice when she had mild-moderate  amount of barium contrast still in pharynx and she did not sense any aspiration or penetration events. At this time, SLP recommending continue nectar thick liquids with meals, solids can remain 'regular' as daughter orders her soft foods. Thin liquids can be consumed in between meals when patient sitting upright, fully awake and alert, no straws and with small cup sips. SLP will follow up to complete education with patient and daughter(s).   Swallow Evaluation Recommendations       SLP Diet Recommendations: Regular solids;Dysphagia 3 (Mech soft) solids;Nectar thick liquid   Liquid Administration via: Cup;Straw   Medication Administration: Whole meds with puree   Supervision: Patient able to self feed;Full supervision/cueing for compensatory strategies   Compensations: Slow rate;Small sips/bites       Oral Care Recommendations: Oral care BID       Sonia Baller, MA, CCC-SLP Speech Therapy

## 2022-05-12 ENCOUNTER — Inpatient Hospital Stay (HOSPITAL_COMMUNITY): Payer: Medicare HMO

## 2022-05-12 DIAGNOSIS — E039 Hypothyroidism, unspecified: Secondary | ICD-10-CM | POA: Diagnosis not present

## 2022-05-12 DIAGNOSIS — R627 Adult failure to thrive: Secondary | ICD-10-CM | POA: Diagnosis not present

## 2022-05-12 DIAGNOSIS — W19XXXA Unspecified fall, initial encounter: Secondary | ICD-10-CM | POA: Diagnosis not present

## 2022-05-12 DIAGNOSIS — Z515 Encounter for palliative care: Secondary | ICD-10-CM | POA: Diagnosis not present

## 2022-05-12 DIAGNOSIS — D509 Iron deficiency anemia, unspecified: Secondary | ICD-10-CM | POA: Diagnosis not present

## 2022-05-12 DIAGNOSIS — R55 Syncope and collapse: Secondary | ICD-10-CM | POA: Diagnosis not present

## 2022-05-12 DIAGNOSIS — N179 Acute kidney failure, unspecified: Secondary | ICD-10-CM | POA: Diagnosis not present

## 2022-05-12 DIAGNOSIS — M5126 Other intervertebral disc displacement, lumbar region: Secondary | ICD-10-CM | POA: Diagnosis not present

## 2022-05-12 DIAGNOSIS — I10 Essential (primary) hypertension: Secondary | ICD-10-CM | POA: Diagnosis not present

## 2022-05-12 DIAGNOSIS — R64 Cachexia: Secondary | ICD-10-CM | POA: Diagnosis not present

## 2022-05-12 DIAGNOSIS — Z8249 Family history of ischemic heart disease and other diseases of the circulatory system: Secondary | ICD-10-CM | POA: Diagnosis not present

## 2022-05-12 DIAGNOSIS — I6381 Other cerebral infarction due to occlusion or stenosis of small artery: Secondary | ICD-10-CM | POA: Diagnosis not present

## 2022-05-12 DIAGNOSIS — E872 Acidosis, unspecified: Secondary | ICD-10-CM | POA: Diagnosis not present

## 2022-05-12 DIAGNOSIS — Z681 Body mass index (BMI) 19 or less, adult: Secondary | ICD-10-CM | POA: Diagnosis not present

## 2022-05-12 DIAGNOSIS — R338 Other retention of urine: Secondary | ICD-10-CM | POA: Diagnosis not present

## 2022-05-12 DIAGNOSIS — Z1152 Encounter for screening for COVID-19: Secondary | ICD-10-CM | POA: Diagnosis not present

## 2022-05-12 DIAGNOSIS — M3313 Other dermatomyositis without myopathy: Secondary | ICD-10-CM | POA: Diagnosis not present

## 2022-05-12 DIAGNOSIS — S41112A Laceration without foreign body of left upper arm, initial encounter: Secondary | ICD-10-CM | POA: Diagnosis not present

## 2022-05-12 DIAGNOSIS — F0390 Unspecified dementia without behavioral disturbance: Secondary | ICD-10-CM | POA: Diagnosis not present

## 2022-05-12 DIAGNOSIS — M810 Age-related osteoporosis without current pathological fracture: Secondary | ICD-10-CM | POA: Diagnosis present

## 2022-05-12 DIAGNOSIS — Z794 Long term (current) use of insulin: Secondary | ICD-10-CM | POA: Diagnosis not present

## 2022-05-12 DIAGNOSIS — E1143 Type 2 diabetes mellitus with diabetic autonomic (poly)neuropathy: Secondary | ICD-10-CM | POA: Diagnosis not present

## 2022-05-12 DIAGNOSIS — L89152 Pressure ulcer of sacral region, stage 2: Secondary | ICD-10-CM | POA: Diagnosis not present

## 2022-05-12 DIAGNOSIS — G909 Disorder of the autonomic nervous system, unspecified: Secondary | ICD-10-CM | POA: Diagnosis not present

## 2022-05-12 DIAGNOSIS — R69 Illness, unspecified: Secondary | ICD-10-CM | POA: Diagnosis not present

## 2022-05-12 DIAGNOSIS — E1165 Type 2 diabetes mellitus with hyperglycemia: Secondary | ICD-10-CM | POA: Diagnosis not present

## 2022-05-12 DIAGNOSIS — E440 Moderate protein-calorie malnutrition: Secondary | ICD-10-CM | POA: Diagnosis not present

## 2022-05-12 DIAGNOSIS — E7800 Pure hypercholesterolemia, unspecified: Secondary | ICD-10-CM | POA: Diagnosis not present

## 2022-05-12 LAB — CBC WITH DIFFERENTIAL/PLATELET
Abs Immature Granulocytes: 0.04 10*3/uL (ref 0.00–0.07)
Basophils Absolute: 0 10*3/uL (ref 0.0–0.1)
Basophils Relative: 0 %
Eosinophils Absolute: 0.5 10*3/uL (ref 0.0–0.5)
Eosinophils Relative: 7 %
HCT: 32.3 % — ABNORMAL LOW (ref 36.0–46.0)
Hemoglobin: 10.3 g/dL — ABNORMAL LOW (ref 12.0–15.0)
Immature Granulocytes: 1 %
Lymphocytes Relative: 13 %
Lymphs Abs: 0.8 10*3/uL (ref 0.7–4.0)
MCH: 30.8 pg (ref 26.0–34.0)
MCHC: 31.9 g/dL (ref 30.0–36.0)
MCV: 96.7 fL (ref 80.0–100.0)
Monocytes Absolute: 0.6 10*3/uL (ref 0.1–1.0)
Monocytes Relative: 11 %
Neutro Abs: 4.2 10*3/uL (ref 1.7–7.7)
Neutrophils Relative %: 68 %
Platelets: 190 10*3/uL (ref 150–400)
RBC: 3.34 MIL/uL — ABNORMAL LOW (ref 3.87–5.11)
RDW: 13.7 % (ref 11.5–15.5)
WBC: 6.1 10*3/uL (ref 4.0–10.5)
nRBC: 0 % (ref 0.0–0.2)

## 2022-05-12 LAB — COMPREHENSIVE METABOLIC PANEL
ALT: 38 U/L (ref 0–44)
AST: 56 U/L — ABNORMAL HIGH (ref 15–41)
Albumin: 2.5 g/dL — ABNORMAL LOW (ref 3.5–5.0)
Alkaline Phosphatase: 65 U/L (ref 38–126)
Anion gap: 7 (ref 5–15)
BUN: 24 mg/dL — ABNORMAL HIGH (ref 8–23)
CO2: 25 mmol/L (ref 22–32)
Calcium: 9.2 mg/dL (ref 8.9–10.3)
Chloride: 105 mmol/L (ref 98–111)
Creatinine, Ser: 0.77 mg/dL (ref 0.44–1.00)
GFR, Estimated: >60 mL/min (ref >60)
Glucose, Bld: 177 mg/dL — ABNORMAL HIGH (ref 70–99)
Potassium: 3.5 mmol/L (ref 3.5–5.1)
Sodium: 137 mmol/L (ref 135–145)
Total Bilirubin: 0.5 mg/dL (ref 0.3–1.2)
Total Protein: 5.5 g/dL — ABNORMAL LOW (ref 6.5–8.1)

## 2022-05-12 LAB — GLUCOSE, CAPILLARY
Glucose-Capillary: 248 mg/dL — ABNORMAL HIGH (ref 70–99)
Glucose-Capillary: 315 mg/dL — ABNORMAL HIGH (ref 70–99)
Glucose-Capillary: 435 mg/dL — ABNORMAL HIGH (ref 70–99)
Glucose-Capillary: 399 mg/dL — ABNORMAL HIGH (ref 70–99)
Glucose-Capillary: 420 mg/dL — ABNORMAL HIGH (ref 70–99)

## 2022-05-12 LAB — OSMOLALITY, URINE: Osmolality, Ur: 536 mOsm/kg (ref 300–900)

## 2022-05-12 LAB — MAGNESIUM: Magnesium: 1.6 mg/dL — ABNORMAL LOW (ref 1.7–2.4)

## 2022-05-12 LAB — PHOSPHORUS: Phosphorus: 3.1 mg/dL (ref 2.5–4.6)

## 2022-05-12 MED ORDER — INSULIN ASPART 100 UNIT/ML IJ SOLN
6.0000 [IU] | Freq: Once | INTRAMUSCULAR | Status: AC
  Administered 2022-05-12: 6 [IU] via SUBCUTANEOUS

## 2022-05-12 MED ORDER — VITAMIN D 25 MCG (1000 UNIT) PO TABS
1000.0000 [IU] | ORAL_TABLET | Freq: Every day | ORAL | Status: DC
  Administered 2022-05-12 – 2022-05-13 (×2): 1000 [IU] via ORAL
  Filled 2022-05-12 (×2): qty 1

## 2022-05-12 MED ORDER — MIDODRINE HCL 5 MG PO TABS
5.0000 mg | ORAL_TABLET | Freq: Three times a day (TID) | ORAL | Status: DC
  Administered 2022-05-12 – 2022-05-13 (×4): 5 mg via ORAL
  Filled 2022-05-12 (×4): qty 1

## 2022-05-12 MED ORDER — INSULIN DETEMIR 100 UNIT/ML ~~LOC~~ SOLN
5.0000 [IU] | Freq: Every day | SUBCUTANEOUS | Status: DC
  Administered 2022-05-12: 5 [IU] via SUBCUTANEOUS
  Filled 2022-05-12 (×2): qty 0.05

## 2022-05-12 MED ORDER — INSULIN ASPART 100 UNIT/ML IJ SOLN: 0.0000 [IU] | Freq: Three times a day (TID) | INTRAMUSCULAR | Status: DC

## 2022-05-12 MED ORDER — SODIUM CHLORIDE 0.9 % IV SOLN: INTRAVENOUS | Status: DC

## 2022-05-12 MED ORDER — POTASSIUM CHLORIDE CRYS ER 20 MEQ PO TBCR
40.0000 meq | EXTENDED_RELEASE_TABLET | Freq: Two times a day (BID) | ORAL | Status: AC
  Administered 2022-05-12 (×2): 40 meq via ORAL
  Filled 2022-05-12 (×2): qty 2

## 2022-05-12 MED ORDER — INSULIN ASPART 100 UNIT/ML IJ SOLN
0.0000 [IU] | Freq: Every day | INTRAMUSCULAR | Status: DC
  Administered 2022-05-12: 5 [IU] via SUBCUTANEOUS

## 2022-05-12 MED ORDER — MAGNESIUM SULFATE 2 GM/50ML IV SOLN
2.0000 g | Freq: Once | INTRAVENOUS | Status: AC
  Administered 2022-05-12: 2 g via INTRAVENOUS
  Filled 2022-05-12: qty 50

## 2022-05-12 MED ORDER — INSULIN ASPART 100 UNIT/ML IJ SOLN: 3.0000 [IU] | Freq: Three times a day (TID) | INTRAMUSCULAR | Status: DC

## 2022-05-12 NOTE — Progress Notes (Signed)
PROGRESS NOTE    [name] [name]  OQH:[id number] DOB: 01-Jul-1934 DOA: 05/09/2022 PCP: Lajean Manes, MD   Brief Narrative:  Patient is an 88 year old African-American female with a past medical history significant for near syncope status post loop recorder implantation, hypertension, hypothyroidism, history of iron deficiency anemia as well as other comorbidities who presented from her home with syncope x2.  This was associated with generalized weakness and per the daughter patient had a flare of her dermatomyositis and her home p.o. steroids were increased.  Patient states that she passed out on the toilet initially and then passed out sitting in the chair.  Her daughter was concerned that her dermatomyositis and steroids have made it caused her symptoms.  She has had difficulty swallowing thin liquids and there is also concern for aspiration.  She did have a cough last week or so and given her increased weakness and EMS was called and patient was lethargic and they could barely feel her radial pulse.  Was given a 200 cc bolus and brought to the ED for further evaluation.  In the ED she was noted to have lactic acidosis and a low TSH but remains on levothyroxine.  She was given 2 L of IV fluid boluses and admitted for further evaluation for syncope.  Initial labs show that her lactic acid was 3.5 and repeat was then 2.4.  Echocardiogram was ordered as well as PT and OT and showed no follow-up.  Overnight she developed an AKI so we will need to continue to hydrate and work-up further.    AKI improved and she is going to be discharged home however she had a syncopal episode in the bathroom and it was likely another vasovagal episode however we asked cardiology to evaluate and they feel that she has autonomic dysfunction recommending TED hose abdominal binder and nutrition with boost of fluid with electrolytes as well as midodrine 5 mg 3 times daily and uptitrating as well as palliative care consultation.   Her implantable loop recorder was interrogated and showed no arrhythmias and no cardiac cause of her syncope.  Upon arrival when the patient had a syncopal episode at the rapid response nurse felt that she may have had some seizure-like activity so we will obtain an MRI of the brain and of the cervical spine to evaluate for any lesions causing autonomic dysfunction and EEG. PT OT continue to recommend home health.  We will obtain palliative care consultation as well   Assessment and Plan: No notes have been filed under this hospital service. Service: Hospitalist  Syncope in the setting of autonomic dysfunction and vasovagal syncope -Had 2 syncopal episodes prior to admission and had one today. -Status post loop recorder implant and she had no arrhythmias noted on the implantable loop recorder. -Obtain orthostatic vital signs and she did drop from lying to sitting at 118/52 to 97/75 a few days ago; will need to repeat orthostatics -Given a 1 L bolus today with her syncopal episode -Check echocardiogram and showed "Left ventricular ejection fraction, by estimation, is 65 to 70%. The left ventricle has normal function. The left ventricle has no regional  wall motion abnormalities. There is mild asymmetric left ventricular hypertrophy of the basal-septal segment. Left ventricular diastolic parameters are indeterminate. Right ventricular systolic function is normal. The right ventricular size is normal. There is normal pulmonary artery systolic pressure. The estimated right ventricular systolic pressure is 54.6 mmHg. " -Initial troponin was 21 and repeat was 17 -Urinalysis was unremarkable urine culture  still pending and showed no growth to date -Supportive care with gentle IV fluid hydration with gentle IV fluid hydration but received a 1 L bolus today and will continue maintenance IV fluid at 50 MLS per hour -Continue Fall precautions -We will order TED hose and also order an abdominal binder -PT OT  to further evaluate and treat pending home health now. -Given her acute syncopal episode today cardiology was consulted and she was placed on midodrine 5 mg p.o. 3 times daily and cardiology agrees with this -Given her concern for possible seizure-like activity and episode from the rapid response nurse we will obtain MRI of the brain and cervical spine to evaluate for any lesions that can cause autonomic dysfunction as well as an EEG -Palliative care has been consulted for further goals of care discussion given that she has dermatomyositis and extremely thin cachectic female   Lactic acidosis, unclear etiology patient is resolved -Presented with lactic acid 3.5 -> 2.4 -> 1.2 -No evidence of active infective process. -Chest x-ray nonacute, UA negative for pyuria. -Continue IV fluid hydration with normal saline at 50 MLS per hour -Repeat lactic acid has resolved   Hypothyroidism on home levothyroxine -Presenting TSH 0.022, free T4 pending -Reduced home levothyroxine dose to 100 mcg from 200 mcg for now. -Follow free T4 and was 2.60. -She will need repeat thyroid function studies within 4 weeks weeks   Type 2 diabetes with hyperglycemia -Hemoglobin A1c of 8.9 -Start insulin sliding scale and increase the dose and range to moderate sliding scale -Continue monitor CBGs per protocol and they have been ranging from 248-435 -We will increase her nighttime Levemir to 5 units nightly and add NovoLog 3 times daily 3 units she eats greater than 50%;   Possible dysphagia to liquids? -Speech therapist evaluation done and she had an MBS which found swallow initiation delayed with thin liquids but no aspiration.  SLP recommends to continue regular solids with nectar thick liquids and plan for MBS tomorrow to see if there is any change in her swallow function -The patient was witnessed tolerating a solid diet fed by her daughter at bedside. -SLP evaluated and recommending dysphagia 3 diet with nectar thick  liquids today and now recommending regular diet with nectar thick liquids today   Essential Hypertension -Resume home oral antihypertensives -IV hydralazine as needed with parameters. -Continue to monitor blood pressures per protocol and last blood pressure reading is now 157/61  Hypomagnesemia -Patient's mag level is 1.6 -Replete with IV mag sulfate 2 g -Continue monitor and trend and repeat CMP in a.m. -Repeat mag level in a.m.   AKI -Patient's BUN/Cr went from 22/0.75 -> 29/1.41 and is now further improved and back down and is now 24/0.77 -Check Urinalysis, Urine Cr, Urine Osm, Urine Sodium and Renal U/S -Renal U/S No hydronephrosis. -Repeat urinalysis done showed greater than 500 glucose, 100 protein, urine specific gravity of 1.018, no bacteria seen, present on cath, present mucus, 0-5 RBCs per high-power field, 0-5 squamous epithelial cells and 6-10 WBCs -Continue gentle IV fluid hydration with normal saline at 50 MLS per hour -Avoid further nephrotoxic medications, contrast dyes, hypotension and dehydration to avoid renal hypoperfusion and will need to renally dose medications -Repeat CMP in a.m.  Acute urinary retention -She continues to retain her urine so she was in and out cath -She is already had this done twice and if she happens a third time we will need to place a Foley and leave in for short-term -If Foley  catheter gets placed she can follow-up with urology in outpatient setting  Normocytic Anemia -Patient's Hgb/Hct went from 11.1/34.1 -> 10.8/32.5 -> 10.4/31.3 -> 10.3/32.3 -Check Anemia Panel in the AM  -Continue to Monitor for S/Sx of Bleeding; No overt Bleeding noted -Repeat CBC in the AM   Physical debility -PT OT assessment recommending no follow up -Fall precautions.   Left arm tear, POA -Wound care Consult -Local wound care.   Slightly abnormal AST -Patient's AST is now 48 -> 56 and ALT is now 30 -> 38 -Continue monitor and trend and repeat CMP in  a.m. and if necessary will obtain a right upper quadrant ultrasound and acute hepatitis panel if not improving   Severe Malnutrition/Underweight -Estimated body mass index is 16.07 kg/m as calculated from the following:   Height as of this encounter: '5\' 5"'$  (1.651 m).   Weight as of this encounter: 43.8 kg. -Nutrition Status: Nutrition Problem: Moderate Malnutrition Etiology: chronic illness Signs/Symptoms: mild fat depletion, moderate muscle depletion, percent weight loss Interventions: Glucerna shake, MVI, Liberalize Diet  DVT prophylaxis: heparin injection 5,000 Units Start: 05/12/22 0600 Place TED hose Start: 05/11/22 2115    Code Status: Full Code Family Communication: Discussed with daughter at bedside  Disposition Plan:  Level of care: Progressive Status is: Inpatient Remains inpatient appropriate because: Had a syncopal episode today needs further work-up and evaluation   Consultants:  Cardiology Palliative Care Medicine  Procedures:  As delineated as above  Antimicrobials:  Anti-infectives (From admission, onward)    Start     Dose/Rate Route Frequency Ordered Stop   05/10/22 1000  cephALEXin (KEFLEX) capsule 500 mg        500 mg Oral 3 times daily 05/10/22 0130         Subjective: Patient was seen at bedside after rapid response had not been actually had a syncopal episode in the bathroom.  Nursing states that she had a little bit of difficulty coming to but when I saw her she was awake alert and had improved.  She states that she feels okay denies chest pain or shortness of breath.  Nursing states that she was weaker her strength is improved on my examination.  No other other concerns or complaints at this time.  Objective: Vitals:   05/12/22 1030 05/12/22 1035 05/12/22 1047 05/12/22 1526  BP: (!) 67/41 (!) 144/56 (!) 157/67 (!) 131/55  Pulse:    73  Resp:    16  Temp:    98 F (36.7 C)  TempSrc:    Oral  SpO2:      Weight:      Height:         Intake/Output Summary (Last 24 hours) at 05/12/2022 2048 Last data filed at 05/12/2022 1539 Gross per 24 hour  Intake 240 ml  Output 1200 ml  Net -960 ml   Filed Weights   05/09/22 1952  Weight: 43.8 kg   Examination: Physical Exam:  Constitutional: Thin cachectic chronically ill appearing AAF in NAD appears slightly uncomfortable Respiratory: Diminished to auscultation bilaterally with coarse breath sounds, no wheezing, rales, rhonchi or crackles. Normal respiratory effort and patient is not tachypenic. No accessory muscle use. Unlabored breathing  Cardiovascular: RRR, no murmurs / rubs / gallops. S1 and S2 auscultated. No extremity edema.  Abdomen: Soft, non-tender, non-distended. Bowel sounds positive.  GU: Deferred. Musculoskeletal: No clubbing / cyanosis of digits/nails. No joint deformity upper and lower extremities.  Skin: Has skin changes due to dermatomyositis Neurologic: CN 2-12 grossly  intact with no focal deficits. Romberg sign and cerebellar reflexes not assessed.  Psychiatric: Normal judgment and insight. Alert and oriented x 3. Normal mood and appropriate affect.   Data Reviewed: I have personally reviewed following labs and imaging studies  CBC: Recent Labs  Lab 05/09/22 1629 05/10/22 0011 05/10/22 0531 05/11/22 0445 05/12/22 0453  WBC 6.8 6.6 8.0 7.2 6.1  NEUTROABS 5.9  --  5.0 4.6 4.2  HGB 11.1* 9.9* 10.8* 10.4* 10.3*  HCT 34.1* 30.7* 32.5* 31.3* 32.3*  MCV 96.1 96.5 93.7 94.0 96.7  PLT 191 174 203 183 267   Basic Metabolic Panel: Recent Labs  Lab 05/09/22 1629 05/10/22 0011 05/10/22 0531 05/11/22 0445 05/12/22 0453  NA 136  --  138 133* 137  K 3.7  --  3.9 4.4 3.5  CL 101  --  103 102 105  CO2 30  --  '26 24 25  '$ GLUCOSE 191*  --  209* 380* 177*  BUN 22  --  22 29* 24*  CREATININE 0.90 0.61 0.75 1.41* 0.77  CALCIUM 9.9  --  9.5 9.0 9.2  MG  --   --  1.8 1.7 1.6*  PHOS  --   --  2.9 3.5 3.1   GFR: Estimated Creatinine Clearance: 34.9  mL/min (by C-G formula based on SCr of 0.77 mg/dL). Liver Function Tests: Recent Labs  Lab 05/09/22 1629 05/10/22 0531 05/11/22 0445 05/12/22 0453  AST 51* 47* 48* 56*  ALT 33 31 30 38  ALKPHOS 74 76 70 65  BILITOT 0.4 0.4 0.3 0.5  PROT 6.5 6.0* 5.6* 5.5*  ALBUMIN 3.1* 2.8* 2.7* 2.5*   Recent Labs  Lab 05/09/22 1629  LIPASE 119*   No results for input(s): "AMMONIA" in the last 168 hours. Coagulation Profile: No results for input(s): "INR", "PROTIME" in the last 168 hours. Cardiac Enzymes: No results for input(s): "CKTOTAL", "CKMB", "CKMBINDEX", "TROPONINI" in the last 168 hours. BNP (last 3 results) No results for input(s): "PROBNP" in the last 8760 hours. HbA1C: Recent Labs    05/10/22 0531  HGBA1C 8.9*   CBG: Recent Labs  Lab 05/11/22 2024 05/12/22 0746 05/12/22 1215 05/12/22 1708 05/12/22 2041  GLUCAP 280* 248* 315* 435* 399*   Lipid Profile: No results for input(s): "CHOL", "HDL", "LDLCALC", "TRIG", "CHOLHDL", "LDLDIRECT" in the last 72 hours. Thyroid Function Tests: No results for input(s): "TSH", "T4TOTAL", "FREET4", "T3FREE", "THYROIDAB" in the last 72 hours. Anemia Panel: No results for input(s): "VITAMINB12", "FOLATE", "FERRITIN", "TIBC", "IRON", "RETICCTPCT" in the last 72 hours. Sepsis Labs: Recent Labs  Lab 05/09/22 1629 05/09/22 1945 05/10/22 0531  LATICACIDVEN 3.5* 2.4* 1.2    Recent Results (from the past 240 hour(s))  Resp Panel by RT-PCR (Flu A&B, Covid) Anterior Nasal Swab     Status: None   Collection Time: 05/09/22  5:58 PM   Specimen: Anterior Nasal Swab  Result Value Ref Range Status   SARS Coronavirus 2 by RT PCR NEGATIVE NEGATIVE Final    Comment: (NOTE) SARS-CoV-2 target nucleic acids are NOT DETECTED.  The SARS-CoV-2 RNA is generally detectable in upper respiratory specimens during the acute phase of infection. The lowest concentration of SARS-CoV-2 viral copies this assay can detect is 138 copies/mL. A negative result  does not preclude SARS-Cov-2 infection and should not be used as the sole basis for treatment or other patient management decisions. A negative result may occur with  improper specimen collection/handling, submission of specimen other than nasopharyngeal swab, presence of viral mutation(s) within the  areas targeted by this assay, and inadequate number of viral copies(<138 copies/mL). A negative result must be combined with clinical observations, patient history, and epidemiological information. The expected result is Negative.  Fact Sheet for Patients:  EntrepreneurPulse.com.au  Fact Sheet for Healthcare Providers:  IncredibleEmployment.be  This test is no t yet approved or cleared by the [place] FDA and  has been authorized for detection and/or diagnosis of SARS-CoV-2 by FDA under an Emergency Use Authorization (EUA). This EUA will remain  in effect (meaning this test can be used) for the duration of the COVID-19 declaration under Section 564(b)(1) of the Act, 21 U.S.C.section 360bbb-3(b)(1), unless the authorization is terminated  or revoked sooner.       Influenza A by PCR NEGATIVE NEGATIVE Final   Influenza B by PCR NEGATIVE NEGATIVE Final    Comment: (NOTE) The Xpert Xpress SARS-CoV-2/FLU/RSV plus assay is intended as an aid in the diagnosis of influenza from Nasopharyngeal swab specimens and should not be used as a sole basis for treatment. Nasal washings and aspirates are unacceptable for Xpert Xpress SARS-CoV-2/FLU/RSV testing.  Fact Sheet for Patients: EntrepreneurPulse.com.au  Fact Sheet for Healthcare Providers: IncredibleEmployment.be  This test is not yet approved or cleared by the [place] FDA and has been authorized for detection and/or diagnosis of SARS-CoV-2 by FDA under an Emergency Use Authorization (EUA). This EUA will remain in effect (meaning this test can be used) for  the duration of the COVID-19 declaration under Section 564(b)(1) of the Act, 21 U.S.C. section 360bbb-3(b)(1), unless the authorization is terminated or revoked.  Performed at [facility], [place] [address], [place], [place] [address]   Urine Culture     Status: None   Collection Time: 05/09/22  6:53 PM   Specimen: Urine, Clean Catch  Result Value Ref Range Status   Specimen Description   Final    URINE, CLEAN CATCH Performed at [facility], [place] [address], [place], [place] [address]    Special Requests   Final    NONE Performed at [facility], [place] [address], [place], [place] [address]    Culture   Final    NO GROWTH Performed at [facility], [place] [address], [place], [place] [address]    Report Status 05/11/2022 FINAL  Final     Radiology Studies: MR CERVICAL SPINE WO CONTRAST  Result Date: 05/12/2022 CLINICAL DATA:  Neck pain, cervical pathology suspected. EXAM: MRI CERVICAL SPINE WITHOUT CONTRAST TECHNIQUE: Multiplanar, multisequence MR imaging of the cervical spine was performed. No intravenous contrast was administered. COMPARISON:  Cervical spine CT 08/06/2021, cervical spine CT myelogram 08/19/2018, cervical spine MRI 03/22/2014. The report from a cervical spine MRI from 08/05/2018 is also available, but the images are not available for direct comparison. FINDINGS: Alignment: Normal. Vertebrae: Vertebral body heights are preserved. Background marrow signal is normal. There is no suspicious marrow signal abnormality. There is mild multilevel degenerative endplate marrow signal abnormality, most notable at C3-C4. There is degenerative/reactive edema in the left posterior elements at C3-C4. Cord: There is cord compression at C2-C3 through C4-C5 with signal abnormality greatest at C3-C4 likely reflecting myelomalacia, also described on the cervical spine MRI from 08/05/2018. Cord signal above and below these levels is  normal. Posterior Fossa, vertebral arteries, paraspinal tissues: Posterior fossa is assessed on the separately dictated brain MRI. The vertebral artery flow voids are normal. The paraspinal soft tissues are unremarkable. Disc levels: There is multilevel disc desiccation and narrowing, most  advanced at C3-C4 C2-C3: There is a posterior disc osteophyte complex and left worse than right uncovertebral ridging and facet arthropathy resulting in moderate to severe spinal canal stenosis with flattening of the dorsal and ventral cord surfaces and severe left and moderate right neural foraminal stenosis. C3-C4: There is a posterior disc osteophyte complex, prominent bilateral uncovertebral ridging, and bilateral facet arthropathy with ligamentum flavum thickening resulting in severe spinal canal stenosis with cord compression and severe bilateral neural foraminal stenosis. C4-C5: There is a posterior disc osteophyte complex with a prominent central protrusion/superiorly migrated extrusion left worse than right uncovertebral spurring, and left worse than right facet arthropathy with ligamentum flavum thickening resulting in severe spinal canal stenosis with cord compression and severe left and moderate right neural foraminal stenosis C5-C6: There is a broad-based posterior disc osteophyte complex, uncovertebral ridging, and mild bilateral facet arthropathy with ligamentum flavum thickening resulting in moderate spinal canal stenosis with flattening of the cord surfaces, and severe bilateral neural foraminal stenosis C6-C7: There is a broad-based posterior disc osteophyte complex slightly eccentric to the right common bilateral uncovertebral ridging, and mild bilateral facet arthropathy with ligamentum flavum thickening resulting in moderate spinal canal stenosis with flattening of the ventral cord surface worse on the right, and severe bilateral neural foraminal stenosis. C7-T1: There is a disc bulge, degenerative endplate  change, and bilateral facet arthropathy resulting in severe spinal bilateral neural foraminal stenosis without significant spinal canal stenosis. Mild disc bulges at T1-T2 and T2-T3 without high-grade spinal canal or neural foraminal stenosis. IMPRESSION: 1. Advanced degenerative change of the cervical spine as above with moderate to severe spinal canal stenosis at C2-C3 through C6-C7 (severe at C3-C4 and C4-C5), with cord compression and cord signal abnormality consistent with myelomalacia C3-C4, likely unchanged compared to the prior cervical spine MRI from 2020 based on that report (though note that these images are not available for direct comparison). 2. Multilevel severe neural foraminal stenosis bilaterally as above. 3. Mild edema in the left posterior elements at C3-C4, likely degenerative/reactive in nature but may reflect a source of pain. Electronically Signed   By: Valetta Mole M.D.   On: 05/12/2022 14:31   MR BRAIN WO CONTRAST  Result Date: 05/12/2022 CLINICAL DATA:  History of cryptogenic stroke in 2021 status post implantable loop recorder, dementia, syncopal episodes, mental status change. EXAM: MRI HEAD WITHOUT CONTRAST TECHNIQUE: Multiplanar, multiecho pulse sequences of the brain and surrounding structures were obtained without intravenous contrast. COMPARISON:  CT head 05/09/2022, brain MRI 10/09/2019 FINDINGS: Brain: There is no acute intracranial hemorrhage, extra-axial fluid collection, or acute infarct. There is moderate global parenchymal volume loss with prominence of the ventricular system and extra-axial CSF spaces, not significantly changed since the prior MRI from 2021. There is patchy and confluent FLAIR signal abnormality in the supratentorial brain which is nonspecific but likely reflects sequela of moderate underlying chronic small vessel ischemic change. There are small remote lacunar infarcts in the basal ganglia, thalami, and left cerebellar hemisphere. A right thalamic  infarct is new since 2021. There is no suspicious parenchymal signal abnormality. There is no mass lesion there is no mass effect or midline shift. Vascular: Normal flow voids. Skull and upper cervical spine: Normal marrow signal. Sinuses/Orbits: The paranasal sinuses are clear. Bilateral lens implants are in place. The globes and orbits are otherwise unremarkable. Other: There is a small left mastoid effusion, also present in 2021. The imaged nasopharynx is unremarkable. IMPRESSION: 1. No acute intracranial pathology. 2. Background parenchymal volume loss, chronic  small vessel ischemic change, and small remote lacunar infarcts as above. Electronically Signed   By: Valetta Mole M.D.   On: 05/12/2022 14:17   US RENAL  Result Date: 05/11/2022 CLINICAL DATA:  Acute kidney injury EXAM: RENAL / URINARY TRACT ULTRASOUND COMPLETE COMPARISON:  CT abdomen and pelvis dated May 09, 2022 FINDINGS: Right Kidney: Renal measurements: 8.6 x 4.2 x 6.7 cm = volume: 128.7 mL. Echogenicity within normal limits. No mass or hydronephrosis visualized. Left Kidney: Renal measurements: 9.2 x 5.4 x 5.0 cm = volume: 127.9 mL. Echogenicity within normal limits. No mass or hydronephrosis visualized. Simple appearing cyst of the lower pole of the left kidney measuring 2.7 x 2.1 x 2.4 cm. Bladder: Appears normal for degree of bladder distention. Bilateral jets visualized. Other: Images suboptimal due to patient condition. IMPRESSION: No hydronephrosis. Electronically Signed   By: Yetta Glassman M.D.   On: 05/11/2022 15:26   DG Swallowing Func-Speech Pathology  Result Date: 05/11/2022 Table formatting from the original result was not included. Objective Swallowing Evaluation: Type of Study: MBS-Modified Barium Swallow Study  Patient Details Name: SOMALY MARTENEY MRN: [id number] Date of Birth: 29-Feb-1934 Today's Date: 05/11/2022 Time: SLP Start Time (ACUTE ONLY): 1420 -SLP Stop Time (ACUTE ONLY): 1440 SLP Time Calculation (min) (ACUTE  ONLY): 20 min Past Medical History: Past Medical History: Diagnosis Date  Arthritis   "knees, legs" (06/10/2016)  Ascites   Chronic kidney disease   "related to my diabetes"  Chronic lower back pain   Diabetes mellitus without complication (HCC)   Eczema   GERD (gastroesophageal reflux disease)   High cholesterol   Hyperlipidemia   Hypertension   Hypothyroidism   OAB (overactive bladder)   Osteoporosis   Thyroid disease   Type II diabetes mellitus ([place])   Urticaria  Past Surgical History: Past Surgical History: Procedure Laterality Date  ABDOMINAL HYSTERECTOMY    KNEE ARTHROSCOPY    LAPAROTOMY N/A 12/03/2016  Procedure: EXPLORATORY LAPAROTOMY, LYSIS OF ADHESIONS;  Surgeon: Armandina Gemma, MD;  Location: [facility];  Service: General;  Laterality: N/A;  LOOP RECORDER INSERTION N/A 08/23/2019  Procedure: LOOP RECORDER INSERTION;  Surgeon: Evans Lance, MD;  Location: [facility];  Service: Cardiovascular;  Laterality: N/A;  vocal cord polyps   HPI: Patient is an 88 y.o. female with PMH: HTN, hypothyroidism, iron deficiency, near syncope s/p loop recorder implant. 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 When EMS arrived, patient lethargic and they could barely feel her radial pulse. In ED, workup was unremarkable except for lactic acidosis 3.5 and low TSH .222. She was started on a regular solids, nectar thick liquids diet by MD, awaiting SLP evaluation of swallow function.  Subjective: pleasant, awake and alert but per OT cancel note, patient refused OT session at 1257, stating she was tired  Recommendations for follow up therapy are one component of a multi-disciplinary discharge planning process, led by the attending physician.  Recommendations may be updated based on patient status, additional functional criteria and insurance  authorization. Assessment / Plan / Recommendation   05/11/2022   3:02 PM Clinical Impressions Clinical Impression Patient presents with primary pharyngeal phase dysphagia as per this MBS. SLP tested her toleration of the following consistencies: thin, nectar thick, puree. Initially with thin liquids, patient's swallow appeared similar to presentation during February, 2023 MBS, however as  testing continued, swallow appeared to decline. With first two sips of thin liquids, patient exhibited swallow initiation delays at level of pyriform sinus and trace flash penetration which did clear laryngeal vestibule, but by third sip of thin liquids, she exhibited deeper penetration above vocal cords which did not clear. Patient was able to clear penetrate several times with a cued cough/throat clear. As evaluation progressed, patient started to exhibit silent aspiration of thin liquids during the swallow with penetration to the vocal cords occuring during and after the swallow. Chin tuck helped to reduce penetration intially, but when tried second time, patient had aspiration of thin liquids. Trace penetration but no aspiration occured with nectar thick liquids and no penetration or aspiration occured with puree solids. All barium consistencies tested resulted in pharyngeal residuals, with puree solids exhibiting mild-mod vallecular residuals, nectar thick liquids exhibiting mild vallecular residuals and thin liquids exhibiting min vallecular and pyriform sinus residuals overall. (one instance of moderate amount of vallecular and mild-mod pyriform sinus residuals which cleared to min with second, dry swallow). Patient did not demonstrate adequate pharyngeal sensation as she did not notice when she had mild-moderate amount of barium contrast still in pharynx and she did not sense any aspiration or penetration events. At this time, SLP recommending continue nectar thick liquids with meals, solids can remain 'regular' as daughter  orders her soft foods. Thin liquids can be consumed in between meals when patient sitting upright, fully awake and alert, no straws and with small cup sips. SLP will follow up to complete education with patient and daughter(s). SLP Visit Diagnosis Dysphagia, unspecified (R13.10) Impact on safety and function Mild aspiration risk;Moderate aspiration risk     05/11/2022   3:02 PM Treatment Recommendations Treatment Recommendations Therapy as outlined in treatment plan below     05/11/2022   3:16 PM Prognosis Prognosis for Safe Diet Advancement Fair Barriers to Reach Goals Time post onset   05/11/2022   3:02 PM Diet Recommendations SLP Diet Recommendations Regular solids;Dysphagia 3 (Mech soft) solids;Nectar thick liquid Liquid Administration via Cup;Straw Medication Administration Whole meds with puree Compensations Slow rate;Small sips/bites     05/11/2022   3:02 PM Other Recommendations Oral Care Recommendations Oral care BID Follow Up Recommendations Follow physician's recommendations for discharge plan and follow up therapies Assistance recommended at discharge Intermittent Supervision/Assistance Functional Status Assessment Patient has had a recent decline in their functional status and demonstrates the ability to make significant improvements in function in a reasonable and predictable amount of time.   05/11/2022   3:02 PM Frequency and Duration  Speech Therapy Frequency (ACUTE ONLY) min 1 x/week Treatment Duration 1 week     05/11/2022   2:57 PM Oral Phase Oral Phase [facility]    05/11/2022   2:57 PM Pharyngeal Phase Pharyngeal Phase Impaired Pharyngeal- Nectar Cup Delayed swallow initiation-vallecula;Reduced laryngeal elevation;Reduced anterior laryngeal mobility;Penetration/Aspiration during swallow;Pharyngeal residue - valleculae;Pharyngeal residue - pyriform Pharyngeal Material enters airway, remains ABOVE vocal cords then ejected out Pharyngeal- Thin Cup Delayed swallow initiation-pyriform sinuses;Reduced  laryngeal elevation;Reduced epiglottic inversion;Reduced tongue base retraction;Penetration/Aspiration during swallow;Pharyngeal residue - valleculae;Pharyngeal residue - pyriform Pharyngeal Material enters airway, passes BELOW cords without attempt by patient to eject out (silent aspiration);Material enters airway, CONTACTS cords and not ejected out Pharyngeal- Thin Straw Pharyngeal residue - valleculae;Pharyngeal residue - pyriform;Penetration/Aspiration during swallow;Reduced tongue base retraction;Reduced airway/laryngeal closure;Reduced laryngeal elevation;Reduced anterior laryngeal mobility;Delayed swallow initiation-pyriform sinuses Pharyngeal Material enters airway, passes BELOW cords without attempt by patient to eject out (silent aspiration);Material enters airway, CONTACTS  cords and not ejected out Pharyngeal- Puree Delayed swallow initiation-vallecula;Reduced anterior laryngeal mobility;Reduced tongue base retraction;Pharyngeal residue - valleculae Pharyngeal- Mechanical Soft NT    05/11/2022   3:00 PM Cervical Esophageal Phase  Cervical Esophageal Phase Impaired Cervical Esophageal Comment suspected cervical osteophytes (seen on February MBS) visualized, no radiologist present to confirm Sonia Baller, MA, CCC-SLP Speech Therapy                      Scheduled Meds:  amLODipine  2.5 mg Oral Daily   betamethasone dipropionate  1 application  Topical BID   cephALEXin  500 mg Oral TID   cholecalciferol  1,000 Units Oral Daily   famotidine  20 mg Oral QHS   feeding supplement (GLUCERNA SHAKE)  237 mL Oral TID BM   folic acid  1 mg Oral QHS   heparin injection (subcutaneous)  5,000 Units Subcutaneous Q8H   [START ON 05/13/2022] insulin aspart  0-15 Units Subcutaneous TID WC   insulin aspart  0-5 Units Subcutaneous QHS   insulin detemir  3 Units Subcutaneous QHS   insulin detemir  7 Units Subcutaneous Daily   levETIRAcetam  500 mg Oral BID   levothyroxine  100 mcg Oral Q0600   loratadine  10  mg Oral Daily   losartan  50 mg Oral Daily   [START ON 05/17/2022] methotrexate  20 mg Oral Weekly   metoprolol tartrate  25 mg Oral BID   midodrine  5 mg Oral TID WC   multivitamins with iron  1 tablet Oral Daily   potassium chloride  40 mEq Oral BID   predniSONE  15 mg Oral Q breakfast   Followed by   Derrill Memo ON 05/15/2022] predniSONE  10 mg Oral Q breakfast   Followed by   Derrill Memo ON 05/22/2022] predniSONE  5 mg Oral Q breakfast   Followed by   Derrill Memo ON 05/29/2022] predniSONE  2.5 mg Oral Q breakfast   Continuous Infusions:  sodium chloride 50 mL/hr at 05/12/22 1416    LOS: 0 days   Raiford Noble, DO [facility] Available via Epic secure chat 7am-7pm After these hours, please refer to coverage provider listed on [email or web address] 05/12/2022, 8:48 PM

## 2022-05-12 NOTE — Inpatient Diabetes Management (Signed)
Inpatient Diabetes Program Recommendations  AACE/ADA: New Consensus Statement on Inpatient Glycemic Control (2015)  Target Ranges:  Prepandial:   less than 140 mg/dL      Peak postprandial:   less than 180 mg/dL (1-2 hours)      Critically ill patients:  140 - 180 mg/dL   Lab Results  Component Value Date   GLUCAP 248 (H) 05/12/2022   HGBA1C 8.9 (H) 05/10/2022    Review of Glycemic Control  Latest Reference Range & Units 05/11/22 11:17 05/11/22 17:38 05/11/22 20:24 05/12/22 07:46  Glucose-Capillary 70 - 99 mg/dL 397 (H) 335 (H) 280 (H) 248 (H)  (H): Data is abnormally high Diabetes history: Type 2 DM Outpatient Diabetes medications: Levemir 3 units QHS, 7 units QD, Novolog 2-10 units TID Current orders for Inpatient glycemic control: Levemir 3 units QHS, 7 units QD, Novolog 0-9 units TID & HS Prednisone 15 mg QD until 10/27  Inpatient Diabetes Program Recommendations:    Consider: -increasing Levemir to 5 units QHS -Adding Novolog 3 units TID (assuming patient is consuming >50% of meals)  Thanks, Bronson Curb, MSN, RNC-OB Diabetes Coordinator [phone] (8a-5p)

## 2022-05-12 NOTE — Progress Notes (Signed)
Around 10:30 patient had syncopal episode while on [facility].  Charge nurse and rapid response called. MD paged and came to bedside.  Patient moved to bed and gradually became more responsive.  Vitals improved.  No other episodes for remainder of shift.

## 2022-05-12 NOTE — Progress Notes (Signed)
Speech Language Pathology Treatment: Dysphagia  Patient Details Name: [name] [name] MRN: [id number] DOB: 30-May-1934 Today's Date: 05/12/2022 Time: 3005-1102 SLP Time Calculation (min) (ACUTE ONLY): 15 min  Assessment / Plan / Recommendation Clinical Impression  Patient seen by SLP for skilled treatment focused on dysphagia goals. Her daughter Howell Rucks was in the room and reported that patient had eaten well with her dinner. Patient had syncopal episode on [facility] this morning and per daughter, this has been happening more frequently in recent past even prior to this hospital admission. Patient was very fatigued and even told SLP, "I'll fall asleep on you". SLP therefore did not attempt any PO's. Session focused on educating daughter about using nectar thick thickener as well as reasoning from yesterday's MBS. SLP left starter kit for Simply Thick nectar thick in room for patient to take home. SLP explained to daughter that during [place], patient initially was not having aspiration with thin liquids but as study progressed, her swallow function declined. Recommendation continues to be regular solids (ordered by family as they care for her at baseline) and nectar thick liquids during meals but allow for thin liquids in between  meals. SLP will continue to follow for dysphagia goals and patient/family education.    HPI HPI: Patient is an 88 y.o. female with PMH: HTN, hypothyroidism, iron deficiency, near syncope s/p loop recorder implant. 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 When EMS arrived, patient lethargic and they could barely feel her radial pulse. In ED, workup was unremarkable except for lactic acidosis 3.5 and low TSH .222. She was started on a regular solids, nectar thick liquids diet by MD, awaiting SLP  evaluation of swallow function.      SLP Plan  Continue with current plan of care      Recommendations for follow up therapy are one component of a multi-disciplinary discharge planning process, led by the attending physician.  Recommendations may be updated based on patient status, additional functional criteria and insurance authorization.    Recommendations  Diet recommendations: Regular;Nectar-thick liquid Liquids provided via: Straw;Cup Medication Administration: Whole meds with puree Supervision: Full supervision/cueing for compensatory strategies;Staff to assist with self feeding;Patient able to self feed Compensations: Slow rate;Small sips/bites Postural Changes and/or Swallow Maneuvers: Seated upright 90 degrees                Oral Care Recommendations: Oral care BID Follow Up Recommendations: Home health SLP Assistance recommended at discharge: Intermittent Supervision/Assistance SLP Visit Diagnosis: Dysphagia, unspecified (R13.10) Plan: Continue with current plan of care           Sonia Baller, MA, CCC-SLP Speech Therapy

## 2022-05-12 NOTE — Evaluation (Signed)
Occupational Therapy Evaluation Patient Details Name: [name] [name] MRN: [id number] DOB: 1934/07/08 Today's Date: 05/12/2022   History of Present Illness [name] [name] is a 88 y.o. female with medical history significant for near syncope s/p loop recorder implant, HTN, hypothyroidism, iron deficiency, who presents from home with an near syncope event.  Associated with generalized weakness.  Per her daughter, the patient has had a flareup of her dermatomyositis, and her home p.o. steroids dose was increased.  Her daughter is concerned that it may have contributed to her symptoms.  Has had difficulty swallowing thin liquids and there was also concern for aspiration.  She has had a cough for the last week.  Due to increased weakness EMS was activated.  Upon EMS arrival, the patient was lethargic and they could barely feel her radial pulse.  She was given 200 cc bolus of IV fluid and brought into the ED for further evaluation.     In the ED, work-up was unremarkable except for lactic acidosis 3.5 and low TSH 0.022, on home levothyroxine.  The patient received 2 L IV fluid bolus in the ED.  EDP requested admission for further management.  The patient was admitted by [facility], hospitalist service.   Clinical Impression   Patient was received on the bedside commode. Her daughter were present during the session. Pt required min assist to stand using a RW, max assist for toileting management, and min guard assist to ambulate a few feet to the bed using a RW. Per the pt's daughter, the pt has chronic neuropathy causing associated symptoms in her hands and feet. The pt was also noted to be with slight deconditioning, reports of chronic B LE pain, mild generalized weakness, and intermittent unsteadiness in standing. She will benefit from further OT services to maximize her safety and independence with self-care tasks. The pt's daughters desire for the pt to return home at discharge with home health therapy services.       Recommendations for follow up therapy are one component of a multi-disciplinary discharge planning process, led by the attending physician.  Recommendations may be updated based on patient status, additional functional criteria and insurance authorization.   Follow Up Recommendations  Home health OT    Assistance Recommended at Discharge Frequent or constant Supervision/Assistance  Patient can return home with the following A little help with walking and/or transfers;Direct supervision/assist for medications management;Assist for transportation;Help with stairs or ramp for entrance;Assistance with cooking/housework    Functional Status Assessment  Patient has had a recent decline in their functional status and demonstrates the ability to make significant improvements in function in a reasonable and predictable amount of time.  Equipment Recommendations  None recommended by OT       Precautions / Restrictions Precautions Precautions: Fall Restrictions Weight Bearing Restrictions: No      Mobility Bed Mobility Overal bed mobility: Needs Assistance         Sit to supine: Min assist        Transfers Overall transfer level: Needs assistance Equipment used: Rolling walker (2 wheels) Transfers: Sit to/from Stand Sit to Stand: Min assist           General transfer comment: verbal cues for hand placement and trunk extension in standing      Balance       Sitting balance - Comments: Fair+       Standing balance comment: min guard to min assist using a RW  ADL either performed or assessed with clinical judgement   ADL   Eating/Feeding: Set up;Sitting Eating/Feeding Details (indicate cue type and reason): based on clinical judgement Grooming: Set up;Sitting Grooming Details (indicate cue type and reason): based on clinical judgement         Upper Body Dressing : Minimal assistance Upper Body Dressing Details (indicate cue type and reason):  simulated in sitting Lower Body Dressing: Maximal assistance       Toileting- Clothing Manipulation and Hygiene: Maximal assistance;Sit to/from stand Toileting - Clothing Manipulation Details (indicate cue type and reason): Performed at bedside commode level. PT required assist for clothing management, hygiene, and steadying in standing.                          Pertinent Vitals/Pain Pain Assessment Pain Assessment: Faces Pain Score: 3  Pain Location: Pt reported having chronic B LE pain. She denied the need for pain medication. Pain Intervention(s): Limited activity within patient's tolerance     Hand Dominance Right   Extremity/Trunk Assessment Upper Extremity Assessment Upper Extremity Assessment: Overall WFL for tasks assessed   Lower Extremity Assessment Lower Extremity Assessment: Generalized weakness       Communication     Cognition Arousal/Alertness: Awake/alert Behavior During Therapy: WFL for tasks assessed/performed                 General Comments: Pt was oriented to person, place, month, and year. She followed 1 step commands consistently, however needed occasional light repetition         [place] expects to be discharged to:: Private residence Living Arrangements: Children Available Help at Discharge: Family;Available 24 hours/day Type of Home: House Home Access: Stairs to enter [employer] of Steps: 2   Home Layout: One level     Bathroom Shower/Tub: Walk-in shower         Home Equipment: Conservation officer, nature (2 wheels);Shower seat - built in;Wheelchair - manual;BSC/3in1   Additional Comments: Pt is never alone, as she stays with one of her daughter's a couple days out of the week & her other daughter comes to the pt's home and stays the other days.      Prior Functioning/Environment Prior Level of Function : Needs assist             Mobility Comments: Family assists with mobility for safety; pt  uses a RW for household ambulation ADLs Comments: Pt requires assist for bathing, dressing,  and toileting hygiene from daughters; her daughters also manage the cooking and cleaning. Pt does not drive.        OT Problem List: Decreased strength;Decreased activity tolerance;Impaired balance (sitting and/or standing);Decreased cognition;Decreased knowledge of use of DME or AE;Pain      OT Treatment/Interventions: Self-care/ADL training;Therapeutic exercise;Therapeutic activities;Cognitive remediation/compensation;Energy conservation;DME and/or AE instruction;Patient/family education;Balance training    OT Goals(Current goals can be found in the care plan section) Acute Rehab OT Goals Patient Stated Goal: Patient's daughter desire for the pt to return home with home therapy upon discharge OT Goal Formulation: With patient/family Time For Goal Achievement: 05/26/22 Potential to Achieve Goals: Good ADL Goals Pt Will Perform Grooming: with set-up;sitting Pt Will Perform Upper Body Dressing: sitting;with supervision Pt Will Perform Lower Body Dressing: with min assist;sit to/from stand Pt Will Transfer to Toilet: with supervision;ambulating Pt Will Perform Toileting - Clothing Manipulation and hygiene: with min guard assist;sit to/from stand  OT Frequency: Min 2X/week       AM-PAC  OT "6 Clicks" Daily Activity     Outcome Measure Help from another person eating meals?: None Help from another person taking care of personal grooming?: A Little Help from another person toileting, which includes using toliet, bedpan, or urinal?: A Lot Help from another person bathing (including washing, rinsing, drying)?: A Lot Help from another person to put on and taking off regular upper body clothing?: A Little Help from another person to put on and taking off regular lower body clothing?: A Lot 6 Click Score: 16   End of Session Equipment Utilized During Treatment: Gait belt;Rolling walker (2  wheels) Nurse Communication: Mobility status  Activity Tolerance: Patient tolerated treatment well Patient left: in bed;with call bell/phone within reach;with bed alarm set;with family/visitor present  OT Visit Diagnosis: Unsteadiness on feet (R26.81);Muscle weakness (generalized) (M62.81)                Time: 8590-9311 OT Time Calculation (min): 24 min Charges:  OT General Charges $OT Visit: 1 Visit OT Evaluation $OT Eval Moderate Complexity: 1 Mod OT Treatments $Therapeutic Activity: 8-22 mins    Leota Sauers, OTR/L 05/12/2022, 5:00 PM

## 2022-05-12 NOTE — Consult Note (Addendum)
Cardiology Consultation   Patient ID: BLIMY NAPOLEON MRN: [id number]; DOB: 20-May-1934  Admit date: 05/09/2022 Date of Consult: 05/12/2022  PCP:  [name] [name], [place] Providers Cardiologist:  None   { Electrophysiologist: [name] [name]   Patient Profile:   [name] [name] is a 88 y.o. female with a hx of DM, HTN, HLD, CKD (III), hypothyroidism, Chronic back pain, PVD, sTIA > loop implant, dementia, dermatomyositis and recurrent syncope who is being seen 05/12/2022 for the evaluation of near syncope at the request of Dr. Alfredia [name].  History of cryptogenic stroke in 2021 s/p implantable loop recorder.   Admitted February 2023 for syncope, hypoglycemia and hyponatremia. Family gave CPR, EMS on arrival had pulse Apparently had a syncopal event with EMS< though I can not track down any cardiac/rhythm/rate data   She was admitted 01/13/22 - 01/20/22 with recurrent syncope. Treated for: Aspiration pneumonia, perhaps etiology of her syncope (?) Loop interrogated though not programmed for brady detection, reportedly no AFib noted Oral thrush (old dx) HTN/tachycardia and started on metoprolol Dermatomyositis (old dx) Echo with preserved LV function and grade 1 diastolic dysfunction  Seen by [name] [name] in clinic 01/30/2022. Device change "more sensitive at least temporarily with brady rate of 40 and tachy at 158"  History of Present Illness:   [name] [name] presented 10/21 with 2 episode of syncope with generalized weakness.  Patient had a dermatomyositis flare and started on steroid prior to syncope.  Per reports she passes out on toilet initially and then sitting on chair.  Patient with history of difficulty swallowing.  She was given fluids in ER. BP dropped from lying to sitting at 118/52 to 97/75.  Echocardiogram with LV function of 65 to 70%.  No regional wall motion abnormality.  Patient with a syncope episode this morning.  Patient slumped over after having bowel movement  on commode.  Daughter was there.  Patient arousable by nurse tach upon arrival.  Currently feeling well.  Laying on bed.  Telemetry with sinus tachycardia.  No arrhythmia noted.   Past Medical History:  Diagnosis Date   Arthritis    "knees, legs" (06/10/2016)   Ascites    Chronic kidney disease    "related to my diabetes"   Chronic lower back pain    Diabetes mellitus without complication (HCC)    Eczema    GERD (gastroesophageal reflux disease)    High cholesterol    Hyperlipidemia    Hypertension    Hypothyroidism    OAB (overactive bladder)    Osteoporosis    Thyroid disease    Type II diabetes mellitus ([place])    Urticaria     Past Surgical History:  Procedure Laterality Date   ABDOMINAL HYSTERECTOMY     KNEE ARTHROSCOPY     LAPAROTOMY N/A 12/03/2016   Procedure: EXPLORATORY LAPAROTOMY, LYSIS OF ADHESIONS;  Surgeon: Armandina Gemma, MD;  Location: [facility];  Service: General;  Laterality: N/A;   LOOP RECORDER INSERTION N/A 08/23/2019   Procedure: LOOP RECORDER INSERTION;  Surgeon: Evans Lance, MD;  Location: [place] [facility];  Service: Cardiovascular;  Laterality: N/A;   vocal cord polyps      Inpatient Medications: Scheduled Meds:  amLODipine  2.5 mg Oral Daily   betamethasone dipropionate  1 application  Topical BID   cephALEXin  500 mg Oral TID   cholecalciferol  1,000 Units Oral Daily   famotidine  20 mg Oral QHS   feeding supplement (GLUCERNA SHAKE)  237  mL Oral TID BM   folic acid  1 mg Oral QHS   heparin injection (subcutaneous)  5,000 Units Subcutaneous Q8H   insulin aspart  0-5 Units Subcutaneous QHS   insulin aspart  0-9 Units Subcutaneous TID WC   insulin detemir  3 Units Subcutaneous QHS   insulin detemir  7 Units Subcutaneous Daily   levETIRAcetam  500 mg Oral BID   levothyroxine  100 mcg Oral Q0600   loratadine  10 mg Oral Daily   losartan  50 mg Oral Daily   [START ON 05/17/2022] methotrexate  20 mg Oral Weekly   metoprolol tartrate  25 mg Oral  BID   midodrine  5 mg Oral TID WC   multivitamins with iron  1 tablet Oral Daily   potassium chloride  40 mEq Oral BID   predniSONE  15 mg Oral Q breakfast   Followed by   Derrill Memo ON 05/15/2022] predniSONE  10 mg Oral Q breakfast   Followed by   Derrill Memo ON 05/22/2022] predniSONE  5 mg Oral Q breakfast   Followed by   Derrill Memo ON 05/29/2022] predniSONE  2.5 mg Oral Q breakfast   Continuous Infusions:  magnesium sulfate bolus IVPB     PRN Meds: acetaminophen, hydrALAZINE, melatonin, polyethylene glycol  Allergies:    Allergies  Allergen Reactions   Nsaids Other (See Comments)    No NSAIDS due to kidney function- ESPECIALLY ibuprofen or Aleve   Biaxin [Clarithromycin] Hives   Bactrim [Sulfamethoxazole-Trimethoprim] Hives and Rash   Lisinopril Cough   Sulfa Antibiotics Hives, Rash and Other (See Comments)    NO sulfa-based meds!!     Social History:   Social History   Socioeconomic History   Marital status: Widowed    Spouse name: Not on file   Number of children: 5   Years of education: 22   Highest education level: Not on file  Occupational History   Not on file  Tobacco Use   Smoking status: Never   Smokeless tobacco: Former    Types: Chew    Quit date: 1985  Vaping Use   Vaping Use: Never used  Substance and Sexual Activity   Alcohol use: No   Drug use: No   Sexual activity: Not Currently    Birth control/protection: Surgical    Comment: hysterectomy  Other Topics Concern   Not on file  Social History Narrative   06/03/21 lives alone   Social Determinants of Health   Financial Resource Strain: Not on file  Food Insecurity: No Food Insecurity (05/10/2022)   Hunger Vital Sign    Worried About Running Out of Food in the Last Year: Never true    Ran Out of Food in the Last Year: Never true  Transportation Needs: No Transportation Needs (05/10/2022)   PRAPARE - Hydrologist (Medical): No    Lack of Transportation (Non-Medical):  No  Physical Activity: Not on file  Stress: Not on file  Social Connections: Not on file  Intimate Partner Violence: Not At Risk (05/10/2022)   Humiliation, Afraid, Rape, and Kick questionnaire    Fear of Current or Ex-Partner: No    Emotionally Abused: No    Physically Abused: No    Sexually Abused: No    Family History:    Family History  Problem Relation Age of Onset   Diabetes Mother    Hypertension Mother    Asthma Father    Diabetes Sister    Stomach cancer Sister  Diabetes Brother    Allergic rhinitis Neg Hx    Eczema Neg Hx    Urticaria Neg Hx      ROS:  Please see the history of present illness.  All other ROS reviewed and negative.     Physical Exam/Data:   Vitals:   05/12/22 0700 05/12/22 1030 05/12/22 1035 05/12/22 1047  BP: (!) 127/52 (!) 67/41 (!) 144/56 (!) 157/67  Pulse:      Resp:      Temp:      TempSrc:      SpO2:      Weight:      Height:        Intake/Output Summary (Last 24 hours) at 05/12/2022 1120 Last data filed at 05/12/2022 0703 Gross per 24 hour  Intake 611.49 ml  Output 1470 ml  Net -858.51 ml      05/09/2022    7:52 PM 01/30/2022    2:45 PM 01/20/2022    4:55 AM  Last 3 Weights  Weight (lbs) 96 lb 9 oz 96 lb 122 lb 9.2 oz  Weight (kg) 43.8 kg 43.545 kg 55.6 kg     Body mass index is 16.07 kg/m.  General: Ill-appearing elderly female in no acute distress HEENT: normal Neck: no JVD Vascular: No carotid bruits; Distal pulses 2+ bilaterally Cardiac:  normal S1, S2; regular tachycardic; no murmur  Lungs:  clear to auscultation bilaterally, no wheezing, rhonchi or rales  Abd: soft, nontender, no hepatomegaly  Ext: no edema Musculoskeletal:  No deformities, BUE and BLE strength normal and equal Skin: warm and dry  Neuro:  CNs 2-12 intact, no focal abnormalities noted Psych:  Normal affect   EKG:  The EKG was personally reviewed and demonstrates: Sinus rhythm Telemetry:  Telemetry was personally reviewed and  demonstrates: Sinus tachycardia  Relevant CV Studies:   Echo 05/10/22 1. Left ventricular ejection fraction, by estimation, is 65 to 70%. The  left ventricle has normal function. The left ventricle has no regional  wall motion abnormalities. There is mild asymmetric left ventricular  hypertrophy of the basal-septal segment.  Left ventricular diastolic parameters are indeterminate.   2. Right ventricular systolic function is normal. The right ventricular  size is normal. There is normal pulmonary artery systolic pressure. The  estimated right ventricular systolic pressure is 54.6 mmHg.   3. The mitral valve is abnormal. Trivial mitral valve regurgitation.   4. The aortic valve is tricuspid. Aortic valve regurgitation is not  visualized. Aortic valve sclerosis/calcification is present, without any  evidence of aortic stenosis.   5. The inferior vena cava is normal in size with greater than 50%  respiratory variability, suggesting right atrial pressure of 3 mmHg.   01/14/2022: TTE  1. Left ventricular ejection fraction, by estimation, is 60 to 65%. The  left ventricle has normal function. The left ventricle has no regional  wall motion abnormalities. Left ventricular diastolic parameters are  consistent with Grade I diastolic  dysfunction (impaired relaxation).   2. Right ventricular systolic function is normal. The right ventricular  size is normal.   3. The mitral valve is degenerative. No evidence of mitral valve  regurgitation. No evidence of mitral stenosis.   4. The aortic valve is normal in structure. Aortic valve regurgitation is  not visualized. No aortic stenosis is present.   5. The inferior vena cava is normal in size with greater than 50%  respiratory variability, suggesting right atrial pressure of 3 mmHg.     Laboratory Data:  High Sensitivity Troponin:   Recent Labs  Lab 05/09/22 1629 05/09/22 1945  TROPONINIHS 21* 17     Chemistry Recent Labs  Lab  05/10/22 0531 05-17-2022 0445 05/12/22 0453  NA 138 133* 137  K 3.9 4.4 3.5  CL 103 102 105  CO2 '26 24 25  '$ GLUCOSE 209* 380* 177*  BUN 22 29* 24*  CREATININE 0.75 1.41* 0.77  CALCIUM 9.5 9.0 9.2  MG 1.8 1.7 1.6*  GFRNONAA >60 36* >60  ANIONGAP '9 7 7    '$ Recent Labs  Lab 05/10/22 0531 05-17-22 0445 05/12/22 0453  PROT 6.0* 5.6* 5.5*  ALBUMIN 2.8* 2.7* 2.5*  AST 47* 48* 56*  ALT 31 30 38  ALKPHOS 76 70 65  BILITOT 0.4 0.3 0.5   Lipids No results for input(s): "CHOL", "TRIG", "HDL", "LABVLDL", "LDLCALC", "CHOLHDL" in the last 168 hours.  Hematology Recent Labs  Lab 05/10/22 0531 May 17, 2022 0445 05/12/22 0453  WBC 8.0 7.2 6.1  RBC 3.47* 3.33* 3.34*  HGB 10.8* 10.4* 10.3*  HCT 32.5* 31.3* 32.3*  MCV 93.7 94.0 96.7  MCH 31.1 31.2 30.8  MCHC 33.2 33.2 31.9  RDW 13.4 13.6 13.7  PLT 203 183 190   Thyroid  Recent Labs  Lab 05/09/22 1629  TSH 0.022*  FREET4 2.60*    BNPNo results for input(s): "BNP", "PROBNP" in the last 168 hours.  DDimer No results for input(s): "DDIMER" in the last 168 hours.   Radiology/Studies:  US RENAL  Result Date: 05-17-22 CLINICAL DATA:  Acute kidney injury EXAM: RENAL / URINARY TRACT ULTRASOUND COMPLETE COMPARISON:  CT abdomen and pelvis dated May 09, 2022 FINDINGS: Right Kidney: Renal measurements: 8.6 x 4.2 x 6.7 cm = volume: 128.7 mL. Echogenicity within normal limits. No mass or hydronephrosis visualized. Left Kidney: Renal measurements: 9.2 x 5.4 x 5.0 cm = volume: 127.9 mL. Echogenicity within normal limits. No mass or hydronephrosis visualized. Simple appearing cyst of the lower pole of the left kidney measuring 2.7 x 2.1 x 2.4 cm. Bladder: Appears normal for degree of bladder distention. Bilateral jets visualized. Other: Images suboptimal due to patient condition. IMPRESSION: No hydronephrosis. Electronically Signed   By: Yetta Glassman M.D.   On: May 17, 2022 15:26   DG Swallowing Func-Speech Pathology  Result Date:  17-May-2022 Table formatting from the original result was not included. Objective Swallowing Evaluation: Type of Study: MBS-Modified Barium Swallow Study  Patient Details Name: [name] [name] MRN: [id number] Date of Birth: 02-Nov-1933 Today's Date: 05/17/2022 Time: SLP Start Time (ACUTE ONLY): 5573 -SLP Stop Time (ACUTE ONLY): 1440 SLP Time Calculation (min) (ACUTE ONLY): 20 min Past Medical History: Past Medical History: Diagnosis Date  Arthritis   "knees, legs" (06/10/2016)  Ascites   Chronic kidney disease   "related to my diabetes"  Chronic lower back pain   Diabetes mellitus without complication (HCC)   Eczema   GERD (gastroesophageal reflux disease)   High cholesterol   Hyperlipidemia   Hypertension   Hypothyroidism   OAB (overactive bladder)   Osteoporosis   Thyroid disease   Type II diabetes mellitus ([place])   Urticaria  Past Surgical History: Past Surgical History: Procedure Laterality Date  ABDOMINAL HYSTERECTOMY    KNEE ARTHROSCOPY    LAPAROTOMY N/A 12/03/2016  Procedure: EXPLORATORY LAPAROTOMY, LYSIS OF ADHESIONS;  Surgeon: Armandina Gemma, MD;  Location: [facility];  Service: General;  Laterality: N/A;  LOOP RECORDER INSERTION N/A 08/23/2019  Procedure: LOOP RECORDER INSERTION;  Surgeon: Evans Lance, MD;  Location: [facility];  Service: Cardiovascular;  Laterality: N/A;  vocal cord polyps   HPI: Patient is an 88 y.o. female with PMH: HTN, hypothyroidism, iron deficiency, near syncope s/p loop recorder implant. 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 When EMS arrived, patient lethargic and they could barely feel her radial pulse. In ED, workup was unremarkable except for lactic acidosis 3.5 and low TSH .222. She was started on a regular solids, nectar thick liquids diet by MD, awaiting SLP evaluation of swallow  function.  Subjective: pleasant, awake and alert but per OT cancel note, patient refused OT session at 1257, stating she was tired  Recommendations for follow up therapy are one component of a multi-disciplinary discharge planning process, led by the attending physician.  Recommendations may be updated based on patient status, additional functional criteria and insurance authorization. Assessment / Plan / Recommendation   05/11/2022   3:02 PM Clinical Impressions Clinical Impression Patient presents with primary pharyngeal phase dysphagia as per this MBS. SLP tested her toleration of the following consistencies: thin, nectar thick, puree. Initially with thin liquids, patient's swallow appeared similar to presentation during February, 2023 MBS, however as testing continued, swallow appeared to decline. With first two sips of thin liquids, patient exhibited swallow initiation delays at level of pyriform sinus and trace flash penetration which did clear laryngeal vestibule, but by third sip of thin liquids, she exhibited deeper penetration above vocal cords which did not clear. Patient was able to clear penetrate several times with a cued cough/throat clear. As evaluation progressed, patient started to exhibit silent aspiration of thin liquids during the swallow with penetration to the vocal cords occuring during and after the swallow. Chin tuck helped to reduce penetration intially, but when tried second time, patient had aspiration of thin liquids. Trace penetration but no aspiration occured with nectar thick liquids and no penetration or aspiration occured with puree solids. All barium consistencies tested resulted in pharyngeal residuals, with puree solids exhibiting mild-mod vallecular residuals, nectar thick liquids exhibiting mild vallecular residuals and thin liquids exhibiting min vallecular and pyriform sinus residuals overall. (one instance of moderate amount of vallecular and mild-mod pyriform sinus  residuals which cleared to min with second, dry swallow). Patient did not demonstrate adequate pharyngeal sensation as she did not notice when she had mild-moderate amount of barium contrast still in pharynx and she did not sense any aspiration or penetration events. At this time, SLP recommending continue nectar thick liquids with meals, solids can remain 'regular' as daughter orders her soft foods. Thin liquids can be consumed in between meals when patient sitting upright, fully awake and alert, no straws and with small cup sips. SLP will follow up to complete education with patient and daughter(s). SLP Visit Diagnosis Dysphagia, unspecified (R13.10) Impact on safety and function Mild aspiration risk;Moderate aspiration risk     05/11/2022   3:02 PM Treatment Recommendations Treatment Recommendations Therapy as outlined in treatment plan below     05/11/2022   3:16 PM Prognosis Prognosis for Safe Diet Advancement Fair Barriers to Reach Goals Time post onset   05/11/2022   3:02 PM Diet Recommendations SLP Diet Recommendations Regular solids;Dysphagia 3 (Mech soft) solids;Nectar thick liquid Liquid Administration via Cup;Straw Medication Administration Whole meds with puree Compensations Slow rate;Small sips/bites     05/11/2022   3:02 PM Other Recommendations Oral Care  Recommendations Oral care BID Follow Up Recommendations Follow physician's recommendations for discharge plan and follow up therapies Assistance recommended at discharge Intermittent Supervision/Assistance Functional Status Assessment Patient has had a recent decline in their functional status and demonstrates the ability to make significant improvements in function in a reasonable and predictable amount of time.   05/11/2022   3:02 PM Frequency and Duration  Speech Therapy Frequency (ACUTE ONLY) min 1 x/week Treatment Duration 1 week     05/11/2022   2:57 PM Oral Phase Oral Phase [facility]    05/11/2022   2:57 PM Pharyngeal Phase Pharyngeal Phase Impaired  Pharyngeal- Nectar Cup Delayed swallow initiation-vallecula;Reduced laryngeal elevation;Reduced anterior laryngeal mobility;Penetration/Aspiration during swallow;Pharyngeal residue - valleculae;Pharyngeal residue - pyriform Pharyngeal Material enters airway, remains ABOVE vocal cords then ejected out Pharyngeal- Thin Cup Delayed swallow initiation-pyriform sinuses;Reduced laryngeal elevation;Reduced epiglottic inversion;Reduced tongue base retraction;Penetration/Aspiration during swallow;Pharyngeal residue - valleculae;Pharyngeal residue - pyriform Pharyngeal Material enters airway, passes BELOW cords without attempt by patient to eject out (silent aspiration);Material enters airway, CONTACTS cords and not ejected out Pharyngeal- Thin Straw Pharyngeal residue - valleculae;Pharyngeal residue - pyriform;Penetration/Aspiration during swallow;Reduced tongue base retraction;Reduced airway/laryngeal closure;Reduced laryngeal elevation;Reduced anterior laryngeal mobility;Delayed swallow initiation-pyriform sinuses Pharyngeal Material enters airway, passes BELOW cords without attempt by patient to eject out (silent aspiration);Material enters airway, CONTACTS cords and not ejected out Pharyngeal- Puree Delayed swallow initiation-vallecula;Reduced anterior laryngeal mobility;Reduced tongue base retraction;Pharyngeal residue - valleculae Pharyngeal- Mechanical Soft NT    05/11/2022   3:00 PM Cervical Esophageal Phase  Cervical Esophageal Phase Impaired Cervical Esophageal Comment suspected cervical osteophytes (seen on February MBS) visualized, no radiologist present to confirm Sonia Baller, MA, CCC-SLP Speech Therapy                     CUP PACEART REMOTE DEVICE CHECK  Result Date: 05/11/2022 ILR summary report received. Battery status OK. Normal device function. No new symptom, tachy, brady, or pause episodes. No new AF episodes. Monthly summary reports and ROV/PRN LA  ECHOCARDIOGRAM COMPLETE  Result Date:  05/10/2022    ECHOCARDIOGRAM REPORT   Patient Name:   [name] [name] Date of Exam: 05/10/2022 Medical Rec #:  [id number]       Height:       65.0 in Accession #:    [id number]      Weight:       96.6 lb Date of Birth:  09/15/1933       BSA:          1.452 m Patient Age:    56 years        BP:           155/74 mmHg Patient Gender: F               HR:           72 bpm. Exam Location:  Inpatient Procedure: 2D Echo Indications:    Syncope  History:        Patient has prior history of Echocardiogram examinations, most                 recent 12/25/2021. Risk Factors:Hypertension and Dyslipidemia.  Sonographer:    Harvie Junior Referring Phys: [id number] OMAIR LATIF [place]  1. Left ventricular ejection fraction, by estimation, is 65 to 70%. The left ventricle has normal function. The left ventricle has no regional wall motion abnormalities. There is mild asymmetric left ventricular hypertrophy of the basal-septal segment. Left ventricular diastolic parameters are indeterminate.  2. Right ventricular systolic function is normal. The right ventricular size is normal. There is normal pulmonary artery systolic pressure. The estimated right ventricular systolic pressure is 42.3 mmHg.  3. The mitral valve is abnormal. Trivial mitral valve regurgitation.  4. The aortic valve is tricuspid. Aortic valve regurgitation is not visualized. Aortic valve sclerosis/calcification is present, without any evidence of aortic stenosis.  5. The inferior vena cava is normal in size with greater than 50% respiratory variability, suggesting right atrial pressure of 3 mmHg. FINDINGS  Left Ventricle: Left ventricular ejection fraction, by estimation, is 65 to 70%. The left ventricle has normal function. The left ventricle has no regional wall motion abnormalities. The left ventricular internal cavity size was normal in size. There is  mild asymmetric left ventricular hypertrophy of the basal-septal segment. Left ventricular diastolic  parameters are indeterminate. Right Ventricle: The right ventricular size is normal. No increase in right ventricular wall thickness. Right ventricular systolic function is normal. There is normal pulmonary artery systolic pressure. The tricuspid regurgitant velocity is 2.67 m/s, and  with an assumed right atrial pressure of 3 mmHg, the estimated right ventricular systolic pressure is 53.6 mmHg. Left Atrium: Left atrial size was normal in size. Right Atrium: Right atrial size was normal in size. Pericardium: There is no evidence of pericardial effusion. Mitral Valve: The mitral valve is abnormal. Trivial mitral valve regurgitation. Tricuspid Valve: The tricuspid valve is normal in structure. Tricuspid valve regurgitation is trivial. Aortic Valve: The aortic valve is tricuspid. Aortic valve regurgitation is not visualized. Aortic valve sclerosis/calcification is present, without any evidence of aortic stenosis. Aortic valve mean gradient measures 4.0 mmHg. Aortic valve peak gradient measures 6.9 mmHg. Aortic valve area, by VTI measures 3.01 cm. Pulmonic Valve: The pulmonic valve was not well visualized. Pulmonic valve regurgitation is not visualized. Aorta: The aortic root is normal in size and structure. Venous: The inferior vena cava is normal in size with greater than 50% respiratory variability, suggesting right atrial pressure of 3 mmHg. IAS/Shunts: The interatrial septum was not well visualized.  LEFT VENTRICLE PLAX 2D LVIDd:         4.10 cm     Diastology LVIDs:         2.80 cm     LV e' medial:    5.00 cm/s LV PW:         0.80 cm     LV E/e' medial:  15.9 LV IVS:        0.90 cm     LV e' lateral:   6.53 cm/s LVOT diam:     2.05 cm     LV E/e' lateral: 12.1 LV SV:         83 LV SV Index:   57 LVOT Area:     3.30 cm  LV Volumes (MOD) LV vol d, MOD A2C: 49.4 ml LV vol d, MOD A4C: 59.6 ml LV vol s, MOD A2C: 25.3 ml LV vol s, MOD A4C: 23.9 ml LV SV MOD A2C:     24.1 ml LV SV MOD A4C:     59.6 ml LV SV MOD BP:       29.7 ml RIGHT VENTRICLE RV Basal diam:  3.40 cm RV Mid diam:    3.00 cm RV S prime:     11.90 cm/s TAPSE (M-mode): 2.7 cm LEFT ATRIUM           Index        RIGHT ATRIUM  Index LA diam:      2.40 cm 1.65 cm/m   RA Area:     11.90 cm LA Vol (A4C): 32.0 ml 22.04 ml/m  RA Volume:   26.40 ml  18.19 ml/m  AORTIC VALVE                    PULMONIC VALVE AV Area (Vmax):    2.72 cm     PV Vmax:       0.96 m/s AV Area (Vmean):   2.54 cm     PV Peak grad:  3.7 mmHg AV Area (VTI):     3.01 cm AV Vmax:           131.00 cm/s AV Vmean:          97.700 cm/s AV VTI:            0.276 m AV Peak Grad:      6.9 mmHg AV Mean Grad:      4.0 mmHg LVOT Vmax:         108.00 cm/s LVOT Vmean:        75.300 cm/s LVOT VTI:          0.251 m LVOT/AV VTI ratio: 0.91  AORTA Ao Root diam: 3.00 cm Ao Asc diam:  3.00 cm MITRAL VALVE                TRICUSPID VALVE MV Area (PHT): 3.17 cm     TR Peak grad:   28.5 mmHg MV Decel Time: 239 msec     TR Vmax:        267.00 cm/s MR Peak grad: 62.1 mmHg MR Vmax:      394.00 cm/s   SHUNTS MV E velocity: 79.30 cm/s   Systemic VTI:  0.25 m MV A velocity: 100.00 cm/s  Systemic Diam: 2.05 cm MV E/A ratio:  0.79 Oswaldo Milian MD Electronically signed by Oswaldo Milian MD Signature Date/Time: 05/10/2022/2:18:57 PM    Final    CT Head Wo Contrast  Result Date: 05/09/2022 CLINICAL DATA:  Altered mental status. EXAM: CT HEAD WITHOUT CONTRAST TECHNIQUE: Contiguous axial images were obtained from the base of the skull through the vertex without intravenous contrast. RADIATION DOSE REDUCTION: This exam was performed according to the departmental dose-optimization program which includes automated exposure control, adjustment of the mA and/or kV according to patient size and/or use of iterative reconstruction technique. COMPARISON:  11/21/2021 FINDINGS: Brain: No evidence of intracranial hemorrhage, acute infarction, hydrocephalus, extra-axial collection, or mass lesion/mass effect. Stable  moderate cerebral atrophy and chronic small vessel disease. Vascular:  No hyperdense vessel or other acute findings. Skull: No evidence of fracture or other significant bone abnormality. Sinuses/Orbits:  No acute findings. Other: None. IMPRESSION: No acute intracranial abnormality. Stable cerebral atrophy and chronic small vessel disease. Electronically Signed   By: Marlaine Hind M.D.   On: 05/09/2022 18:49   CT ABDOMEN PELVIS W CONTRAST  Result Date: 05/09/2022 CLINICAL DATA:  Right lower quadrant pain. EXAM: CT ABDOMEN AND PELVIS WITH CONTRAST TECHNIQUE: Multidetector CT imaging of the abdomen and pelvis was performed using the standard protocol following bolus administration of intravenous contrast. RADIATION DOSE REDUCTION: This exam was performed according to the departmental dose-optimization program which includes automated exposure control, adjustment of the mA and/or kV according to patient size and/or use of iterative reconstruction technique. CONTRAST:  128m OMNIPAQUE IOHEXOL 300 MG/ML  SOLN COMPARISON:  08/18/2019 FINDINGS: Lower Chest: No acute findings. Hepatobiliary: No hepatic masses  identified. Gallbladder is unremarkable. No evidence of biliary ductal dilatation. Pancreas:  No mass or inflammatory changes. Spleen: Within normal limits in size and appearance. Adrenals/Urinary Tract: No suspicious masses identified. No evidence of ureteral calculi or hydronephrosis. Stomach/Bowel: No evidence of obstruction, inflammatory process or abnormal fluid collections. Although the appendix is not directly visualized, no inflammatory process seen in region of the cecum or elsewhere. Vascular/Lymphatic: No pathologically enlarged lymph nodes. No acute vascular findings. Aortic atherosclerotic calcification incidentally noted. Reproductive: Prior hysterectomy noted. Adnexal regions are unremarkable in appearance. Other:  None. Musculoskeletal:  No suspicious bone lesions identified. IMPRESSION: No acute  findings within the abdomen or pelvis. Aortic Atherosclerosis (ICD10-I70.0). Electronically Signed   By: Marlaine Hind M.D.   On: 05/09/2022 18:46   DG Chest 2 View  Result Date: 05/09/2022 CLINICAL DATA:  Cough. EXAM: CHEST - 2 VIEW COMPARISON:  01/16/2022 FINDINGS: The heart size and mediastinal contours are within normal limits. Cardiac loop recorder again noted in anterior chest wall. Aortic atherosclerotic calcification incidentally noted. Both lungs are clear. The visualized skeletal structures are unremarkable. IMPRESSION: No active cardiopulmonary disease. Electronically Signed   By: Marlaine Hind M.D.   On: 05/09/2022 17:06     Assessment and Plan:   Near syncope/autonomic dysfunction/orthostatic hypotension/failure to thrive -Episode today consistent with vasovagal.  Near syncope/syncope after having bowel movement on commode.  Easily arousable by staff.  Telemetry with sinus tachycardia.  No arrhythmia noted. -She presented with 2 syncope episode and one of them occurred on commode.  Highly suspect vasovagal mediated.  However, we will interrogate device to rule out any abnormal rhythm. -Blood pressure dropped from lying to sitting at 118/52 to 97/75.  Consistent with orthostatic. -Trial of midodrine and abdominal binder -Also sees failure to thrive with low albumin.  She has dysphagia and dermatomyositis.  Needs to improve nutritional status. -Echo with preserved LV function without wall motion abnormality.  Would not consider echocardiogram with each near syncope episode.  -I do not think her symptoms is cardiogenic  2.  History of cryptogenic stroke -Has loop recorder  3.  Hypomagnesemia -We will supplement  For questions or updates, please contact [place] Please consult [email or web address] for contact info under    Jarrett Soho, PA  05/12/2022 11:20 AM

## 2022-05-12 NOTE — Plan of Care (Signed)
ILR shows no cardiac arrhythmia underlying her current syncopal event. See consult note from today. Otherwise cardiology will sign off.

## 2022-05-13 ENCOUNTER — Inpatient Hospital Stay (HOSPITAL_COMMUNITY)
Admit: 2022-05-13 | Discharge: 2022-05-13 | Disposition: A | Discharge status: Home / Self Care | Payer: Medicare HMO | Attending: Internal Medicine | Admitting: Internal Medicine

## 2022-05-13 DIAGNOSIS — R55 Syncope and collapse: Secondary | ICD-10-CM | POA: Diagnosis not present

## 2022-05-13 LAB — GLUCOSE, CAPILLARY
Glucose-Capillary: 67 mg/dL — ABNORMAL LOW (ref 70–99)
Glucose-Capillary: 97 mg/dL (ref 70–99)
Glucose-Capillary: 109 mg/dL — ABNORMAL HIGH (ref 70–99)
Glucose-Capillary: 282 mg/dL — ABNORMAL HIGH (ref 70–99)

## 2022-05-13 LAB — CBC WITH DIFFERENTIAL/PLATELET
Abs Immature Granulocytes: 0.04 10*3/uL (ref 0.00–0.07)
Basophils Absolute: 0 10*3/uL (ref 0.0–0.1)
Basophils Relative: 0 %
Eosinophils Absolute: 0.3 10*3/uL (ref 0.0–0.5)
Eosinophils Relative: 4 %
HCT: 29 % — ABNORMAL LOW (ref 36.0–46.0)
Hemoglobin: 9.7 g/dL — ABNORMAL LOW (ref 12.0–15.0)
Immature Granulocytes: 1 %
Lymphocytes Relative: 14 %
Lymphs Abs: 1 10*3/uL (ref 0.7–4.0)
MCH: 31.1 pg (ref 26.0–34.0)
MCHC: 33.4 g/dL (ref 30.0–36.0)
MCV: 92.9 fL (ref 80.0–100.0)
Monocytes Absolute: 0.8 10*3/uL (ref 0.1–1.0)
Monocytes Relative: 11 %
Neutro Abs: 5.2 10*3/uL (ref 1.7–7.7)
Neutrophils Relative %: 70 %
Platelets: 208 10*3/uL (ref 150–400)
RBC: 3.12 MIL/uL — ABNORMAL LOW (ref 3.87–5.11)
RDW: 13.8 % (ref 11.5–15.5)
WBC: 7.3 10*3/uL (ref 4.0–10.5)
nRBC: 0 % (ref 0.0–0.2)

## 2022-05-13 LAB — COMPREHENSIVE METABOLIC PANEL
ALT: 59 U/L — ABNORMAL HIGH (ref 0–44)
AST: 77 U/L — ABNORMAL HIGH (ref 15–41)
Albumin: 2.7 g/dL — ABNORMAL LOW (ref 3.5–5.0)
Alkaline Phosphatase: 65 U/L (ref 38–126)
Anion gap: 6 (ref 5–15)
BUN: 30 mg/dL — ABNORMAL HIGH (ref 8–23)
CO2: 24 mmol/L (ref 22–32)
Calcium: 9.2 mg/dL (ref 8.9–10.3)
Chloride: 107 mmol/L (ref 98–111)
Creatinine, Ser: 0.84 mg/dL (ref 0.44–1.00)
GFR, Estimated: >60 mL/min (ref >60)
Glucose, Bld: 68 mg/dL — ABNORMAL LOW (ref 70–99)
Potassium: 4.4 mmol/L (ref 3.5–5.1)
Sodium: 137 mmol/L (ref 135–145)
Total Bilirubin: 0.4 mg/dL (ref 0.3–1.2)
Total Protein: 5.4 g/dL — ABNORMAL LOW (ref 6.5–8.1)

## 2022-05-13 LAB — RETICULOCYTES
Immature Retic Fract: 3.3 % (ref 2.3–15.9)
RBC.: 3.1 MIL/uL — ABNORMAL LOW (ref 3.87–5.11)
Retic Count, Absolute: 36.3 10*3/uL (ref 19.0–186.0)
Retic Ct Pct: 1.2 % (ref 0.4–3.1)

## 2022-05-13 LAB — FERRITIN: Ferritin: 646 ng/mL — ABNORMAL HIGH (ref 11–307)

## 2022-05-13 LAB — MAGNESIUM: Magnesium: 2.1 mg/dL (ref 1.7–2.4)

## 2022-05-13 LAB — PHOSPHORUS: Phosphorus: 2.8 mg/dL (ref 2.5–4.6)

## 2022-05-13 LAB — IRON AND TIBC
Iron: 61 ug/dL (ref 28–170)
Saturation Ratios: 35 % — ABNORMAL HIGH (ref 10.4–31.8)
TIBC: 175 ug/dL — ABNORMAL LOW (ref 250–450)
UIBC: 114 ug/dL

## 2022-05-13 LAB — VITAMIN B12: Vitamin B-12: 1732 pg/mL — ABNORMAL HIGH (ref 180–914)

## 2022-05-13 LAB — FOLATE: Folate: >40 ng/mL (ref >5.9)

## 2022-05-13 MED ORDER — MIDODRINE HCL 5 MG PO TABS: 5.0000 mg | ORAL_TABLET | Freq: Three times a day (TID) | ORAL | 2 refills | Status: DC

## 2022-05-13 MED ORDER — PREDNISONE 1 MG PO TABS: 12.0000 mg | ORAL_TABLET | Freq: Every day | ORAL | 0 refills | Status: DC

## 2022-05-13 MED ORDER — PREDNISONE 5 MG PO TABS: 5.0000 mg | ORAL_TABLET | Freq: Every day | ORAL | 0 refills | Status: DC

## 2022-05-13 MED ORDER — ACETAMINOPHEN 325 MG PO TABS: 650.0000 mg | ORAL_TABLET | Freq: Four times a day (QID) | ORAL | Status: DC | PRN

## 2022-05-13 MED ORDER — PREDNISONE 10 MG PO TABS: 10.0000 mg | ORAL_TABLET | Freq: Every day | ORAL | 0 refills | Status: AC

## 2022-05-13 MED ORDER — PREDNISONE 10 MG PO TABS: 10.0000 mg | ORAL_TABLET | Freq: Every day | ORAL | 0 refills | Status: DC

## 2022-05-13 MED ORDER — PREDNISONE 1 MG PO TABS: 8.0000 mg | ORAL_TABLET | Freq: Every day | ORAL | 0 refills | Status: AC

## 2022-05-13 MED ORDER — PREDNISONE 5 MG PO TABS: 5.0000 mg | ORAL_TABLET | Freq: Every day | ORAL | 0 refills | Status: AC

## 2022-05-13 MED ORDER — PREDNISONE 1 MG PO TABS: 12.0000 mg | ORAL_TABLET | Freq: Every day | ORAL | 0 refills | Status: AC

## 2022-05-13 MED ORDER — PREDNISONE 5 MG PO TABS: 15.0000 mg | ORAL_TABLET | Freq: Every day | ORAL | 0 refills | Status: AC

## 2022-05-13 MED ORDER — PREDNISONE 1 MG PO TABS: 8.0000 mg | ORAL_TABLET | Freq: Every day | ORAL | 0 refills | Status: DC

## 2022-05-13 MED ORDER — INSULIN ASPART 100 UNIT/ML IJ SOLN
0.0000 [IU] | Freq: Three times a day (TID) | INTRAMUSCULAR | Status: DC
  Administered 2022-05-13: 5 [IU] via SUBCUTANEOUS

## 2022-05-13 NOTE — Discharge Instructions (Addendum)
You have been prescribed a very slow prednisone (steroid) taper as we discussed - the dose will be slowly decreased down to '5mg'$  daily (beginning 06/05/22) - after that time it will be up to your primary care physician to determine if the prednisone should continue at '5mg'$  a day or be slowly tapered further, or even be stopped altogether

## 2022-05-13 NOTE — Progress Notes (Signed)
EEG complete - results pending 

## 2022-05-13 NOTE — Progress Notes (Signed)
Hypoglycemic Event  CBG: 67  Treatment: 4 oz.orange juice   Symptoms: None  Follow-up CBG: Time: 0803 CBG Result:97  Possible Reasons for Event: decreased oral intake  Comments/MD notified: Thereasa Solo, MD notified.      Candie Mile

## 2022-05-13 NOTE — Discharge Summary (Signed)
DISCHARGE SUMMARY  [name] [name]  MR#: [id number]  DOB:07-28-1933  Date of Admission: 05/09/2022 Date of Discharge: 05/13/2022  Attending Physician:Tomesha Sargent Hennie Duos, MD  Patient's GQQ:[id number], Christiane Ha, MD  Disposition: D/C home   Follow-up Appts:  Follow-up Information     Shirley Friar, PA-C Follow up on 06/10/2022.   Specialty: Physician Assistant Why: '@9am'$  for hospital follow up Contact information: [place] [address] [phone]         Lajean Manes, MD. Schedule an appointment as soon as possible for a visit in 1 week(s).   Specialty: Internal Medicine Why: See your primary care doctor to take over prescribing your steroids and to continue to very slow taper. Contact information: [address] [place] [address] [phone]                 Tests Needing Follow-up: -Consider ultimate length of slow steroid taper and provide new prescription or instruct patient to stop prednisone as deemed appropriate based on follow-up exam  Discharge Diagnoses: Syncope due to autonomic dysfunction and vasovagal episodes Lactic acidosis Hypothyroidism DM2 Dysphagia with liquids HTN Hypomagnesemia Acute kidney injury Intermittent acute urinary retention Normocytic anemia Left arm skin tear present on admission Protein calorie malnutrition/underweight  Initial presentation: 88 year old with a history of dermatomyositis, near syncope status post loop recorder monitoring, HTN, hypothyroidism, chronic iron deficiency anemia, and DM2 who presented to the hospital with recurrent syncope x2 associated with severe generalized weakness.  She initially passed out while sitting on the toilet, and then had a recurrent episode when sitting in a chair.  She also had recently suffered a flare of her dermatomyositis requiring increase in her steroid dosing.  In the ER she was noted to be suffering with lactic  acidosis.   With volume resuscitation the patient improved nicely and participated with PT and OT without any indication for need for ongoing care.  The plan was for her to be discharged home and she suffered another syncopal episode in the bathroom at the hospital 10/24.  Cardiology was subsequently asked to evaluate the patient and felt that she was suffering with autonomic dysfunction.  The recommendation was to apply abdominal binder and initiate midodrine.  Her loop recorder was interrogated and confirmed no arrhythmia.  There was a question of possible seizure-like activity at the time of her syncopal spell.  Hospital Course:  Syncope due to autonomic dysfunction and vasovagal episodes No arrhythmias noted on interrogation of loop recorder -has responded well to volume resuscitation -TTE this admission without acute findings -troponins not significantly elevated -TED hose and abdominal binder initiated -midodrine initiated -EEG without evidence of seizure activity -MRI brain without acute findings -patient's family felt strongly that she has these type reactions anytime her steroid doses rapidly increased or decreased and requested a very slow taper of her steroids -I found this to be a reasonable request and prescribed a very long slow steroid taper accordingly   Lactic acidosis Likely simply a consequence of poor intake and volume depletion -no evidence of infection -resolved with simple volume resuscitation   Hypothyroidism Continue usual Synthroid dose   DM2 A1c 8.9 -CBG reasonably controlled during this hospital stay   Dysphagia with liquids Concern was raised for possible intermittent aspiration with thin liquids -SLP evaluated and an MBS has been completed -recommendation is for dysphagia 3 diet with nectar thick liquids -family has been educated   HTN Well-controlled at time of discharge  Hypomagnesemia Likely  due to poor intake -supplement as indicated   Acute kidney  injury Creatinine reached a peak of 1.41 during this hospital stay with a baseline of approximately 0.75 -renal ultrasound with no acute findings -renal function has stabilized with simple volume   Intermittent acute urinary retention Patient suffers intermittent episodes of urinary retention requiring in and out catheter -at the time for discharge she was urinating freely without use of a catheter   Normocytic anemia Likely due to poor nutritional state   Left arm skin tear present on admission Continue local wound care    Protein calorie malnutrition/underweight As evidenced by fat depletion, muscle depletion, and decreased BMI at 16 -felt to be due to chronic illness/advanced age  Allergies as of 05/13/2022       Reactions   Nsaids Other (See Comments)   No NSAIDS due to kidney function- ESPECIALLY ibuprofen or Aleve   Biaxin [clarithromycin] Hives   Bactrim [sulfamethoxazole-trimethoprim] Hives, Rash   Lisinopril Cough   Sulfa Antibiotics Hives, Rash, Other (See Comments)   NO sulfa-based meds!!        Medication List     STOP taking these medications    acetaminophen 650 MG CR tablet Commonly known as: TYLENOL Replaced by: acetaminophen 325 MG tablet   amLODipine 2.5 MG tablet Commonly known as: NORVASC   cephALEXin 500 MG capsule Commonly known as: KEFLEX       TAKE these medications    acetaminophen 325 MG tablet Commonly known as: TYLENOL Take 2 tablets (650 mg total) by mouth every 6 (six) hours as needed for mild pain, fever or headache. Replaces: acetaminophen 650 MG CR tablet   B-D UF III MINI PEN NEEDLES 31G X 5 MM Misc Generic drug: Insulin Pen Needle USE AS DIRECTED TID   Bayer Low Dose 81 MG tablet Generic drug: aspirin EC Take 81 mg by mouth daily. Swallow whole.   betamethasone dipropionate 0.05 % ointment Commonly known as: DIPROLENE Apply 1 application  topically See admin instructions. Apply to affected areas 2 times a day    cetirizine 10 MG tablet Commonly known as: ZYRTEC Take 2.5-5 mg by mouth See admin instructions. Take 2.5 mg by mouth once during the day and 5 mg at bedtime   folic acid 1 MG tablet Commonly known as: FOLVITE Take 1 mg by mouth at bedtime.   Fusion Plus Caps Take 1 capsule by mouth daily with breakfast.   Levemir FlexTouch 100 UNIT/ML FlexPen Generic drug: insulin detemir Inject 3-7 Units into the skin See admin instructions. Inject 7 units into the skin in the morning and 3 units at bedtime if BGL is 200 or below and 4 units if BGL is greater than 200   levETIRAcetam 500 MG tablet Commonly known as: KEPPRA Take 1 tablet (500 mg total) by mouth 2 (two) times daily.   liver oil-zinc oxide 40 % ointment Commonly known as: DESITIN Apply topically 3 (three) times daily.   loperamide 2 MG tablet Commonly known as: IMODIUM A-D Take 2 mg by mouth 4 (four) times daily as needed for diarrhea or loose stools.   losartan 50 MG tablet Commonly known as: COZAAR Take 50 mg by mouth daily.   methotrexate 2.5 MG tablet Commonly known as: RHEUMATREX Take 1 tablet (2.5 mg total) by mouth once a week. Caution:Chemotherapy. Protect from light. What changed:  how much to take when to take this   metoprolol tartrate 25 MG tablet Commonly known as: LOPRESSOR Take 1 tablet (25 mg  total) by mouth 2 (two) times daily.   midodrine 5 MG tablet Commonly known as: PROAMATINE Take 1 tablet (5 mg total) by mouth 3 (three) times daily with meals.   NovoLOG FlexPen 100 UNIT/ML FlexPen Generic drug: insulin aspart Inject 2-10 Units into the skin See admin instructions. Inject 2-10 units into the skin three times a day with meals, per sliding scale:  Breakfast: BGL 80-199 = 8 units; 200-299 = 9 units; 300 or greater = 10 units Lunch: BGL 80-199 = 5 units; 200-299 = 6 units; 300 or greater = 7 units Supper/evening meal: BGL 80-199 = 2 units; 200-299 = 3 units; 300 or greater = 4 units   omega-3  acid ethyl esters 1 g capsule Commonly known as: LOVAZA Take 1 g by mouth 2 (two) times daily.   OneTouch Delica Lancets 44H Misc IC-10 CODE:  E11.65  Check blood sugar   OneTouch Verio test strip Generic drug: glucose blood 1 each 3 (three) times daily.   PEPCID AC PO Take 1 tablet by mouth at bedtime.   polyethylene glycol 17 g packet Commonly known as: MIRALAX / GLYCOLAX Take 17 g by mouth daily. What changed:  when to take this reasons to take this   predniSONE 5 MG tablet Commonly known as: DELTASONE Take 3 tablets (15 mg total) by mouth daily with breakfast for 5 days. Start taking on: May 14, 2022 What changed:  how much to take when to take this additional instructions   predniSONE 1 MG tablet Commonly known as: DELTASONE Take 12 tablets (12 mg total) by mouth daily with breakfast for 7 days. Start taking on: May 19, 2022 What changed: You were already taking a medication with the same name, and this prescription was added. Make sure you understand how and when to take each.   predniSONE 10 MG tablet Commonly known as: DELTASONE Take 1 tablet (10 mg total) by mouth daily with breakfast for 7 days. Start taking on: May 26, 2022 What changed: You were already taking a medication with the same name, and this prescription was added. Make sure you understand how and when to take each.   predniSONE 1 MG tablet Commonly known as: DELTASONE Take 8 tablets (8 mg total) by mouth daily with breakfast for 7 days. Start taking on: June 02, 2022 What changed: You were already taking a medication with the same name, and this prescription was added. Make sure you understand how and when to take each.   predniSONE 5 MG tablet Commonly known as: DELTASONE Take 1 tablet (5 mg total) by mouth daily for 20 days. Start taking on: June 09, 2022 What changed: You were already taking a medication with the same name, and this prescription was added. Make sure you  understand how and when to take each.   Synthroid 200 MCG tablet Generic drug: levothyroxine Take 200 mcg by mouth daily before breakfast.   VITAMIN D-3 PO Take 1,000 Units by mouth daily.        Day of Discharge BP (!) 154/66 (BP Location: Right Arm)   Pulse 64   Temp 97.8 F (36.6 C) (Oral)   Resp 18   Ht '5\' 5"'$  (1.651 m)   Wt 43.8 kg   SpO2 99%   BMI 16.07 kg/m   Physical Exam: General: No acute respiratory distress Lungs: Clear to auscultation bilaterally without wheezes or crackles Cardiovascular: Regular rate and rhythm without murmur gallop or rub normal S1 and S2 Abdomen: Nontender, nondistended, soft,  bowel sounds positive, no rebound, no ascites, no appreciable mass Extremities: No significant cyanosis, clubbing, or edema bilateral lower extremities  Basic Metabolic Panel: Recent Labs  Lab 05/09/22 1629 05/10/22 0011 05/10/22 0531 05/11/22 0445 05/12/22 0453 05/13/22 0449  NA 136  --  138 133* 137 137  K 3.7  --  3.9 4.4 3.5 4.4  CL 101  --  103 102 105 107  CO2 30  --  '26 24 25 24  '$ GLUCOSE 191*  --  209* 380* 177* 68*  BUN 22  --  22 29* 24* 30*  CREATININE 0.90 0.61 0.75 1.41* 0.77 0.84  CALCIUM 9.9  --  9.5 9.0 9.2 9.2  MG  --   --  1.8 1.7 1.6* 2.1  PHOS  --   --  2.9 3.5 3.1 2.8   CBC: Recent Labs  Lab 05/09/22 1629 05/10/22 0011 05/10/22 0531 05/11/22 0445 05/12/22 0453 05/13/22 0449  WBC 6.8 6.6 8.0 7.2 6.1 7.3  NEUTROABS 5.9  --  5.0 4.6 4.2 5.2  HGB 11.1* 9.9* 10.8* 10.4* 10.3* 9.7*  HCT 34.1* 30.7* 32.5* 31.3* 32.3* 29.0*  MCV 96.1 96.5 93.7 94.0 96.7 92.9  PLT 191 174 203 183 190 208   Time spent in discharge (includes decision making & examination of pt): 35 minutes  05/13/2022, 5:22 PM   Cherene Altes, MD [facility] Office  [phone]

## 2022-05-13 NOTE — Inpatient Diabetes Management (Signed)
Inpatient Diabetes Program Recommendations  AACE/ADA: New Consensus Statement on Inpatient Glycemic Control (2015)  Target Ranges:  Prepandial:   less than 140 mg/dL      Peak postprandial:   less than 180 mg/dL (1-2 hours)      Critically ill patients:  140 - 180 mg/dL   Lab Results  Component Value Date   GLUCAP 97 05/13/2022   HGBA1C 8.9 (H) 05/10/2022    Review of Glycemic Control  Latest Reference Range & Units 05/12/22 17:08 05/12/22 20:41 05/12/22 21:20 05/13/22 07:44 05/13/22 08:03  Glucose-Capillary 70 - 99 mg/dL 435 (H) 399 (H) 420 (H) 67 (L) 97  (H): Data is abnormally high (L): Data is abnormally low Diabetes history: Type 2 DM Outpatient Diabetes medications: Levemir 3 units QHS, 7 units QD, Novolog 2-10 units TID Current orders for Inpatient glycemic control: Levemir 5 units QHS, 7 units QD, Novolog 0-15 units TID & HS, Novolog 3 units TID, Novolog 6 units x 1 Prednisone 15 mg QD until 10/27   Inpatient Diabetes Program Recommendations:    Noted mild hypoglycemia this am of 67 mg/dL.   Of note patient received supplements yesterday TID that provided unaccounted CHO and this was corrected with an additional Novolog 6 units and increasing correction scale to moderate.  Consider: -Decreasing correction back to Novolog 0-9 units TID  Thanks, Bronson Curb, MSN, RNC-OB Diabetes Coordinator [phone] (8a-5p)

## 2022-05-13 NOTE — Procedures (Signed)
Patient Name: [name] [name]  MRN: [id number]  Epilepsy Attending: Lora Havens  Referring Physician/Provider: Kerney Elbe, DO Date: 05/13/2022 Duration: 29.57 mins  Patient history: 88yo F with syncope. EEG to evaluate for seizure  Level of alertness: Awake, asleep  AEDs during EEG study: None  Technical aspects: This EEG study was done with scalp electrodes positioned according to the 10-20 International system of electrode placement. Electrical activity was reviewed with band pass filter of 1-'70Hz'$ , sensitivity of 7 uV/mm, display speed of 74m/sec with a '60Hz'$  notched filter applied as appropriate. EEG data were recorded continuously and digitally stored.  Video monitoring was available and reviewed as appropriate.  Description: The posterior dominant rhythm consists of 8 Hz activity of moderate voltage (25-35 uV) seen predominantly in posterior head regions, symmetric and reactive to eye opening and eye closing. Sleep was characterized by vertex waves, sleep spindles (12 to 14 Hz), maximal frontocentral region. Hyperventilation and photic stimulation were not performed.     IMPRESSION: This study is within normal limits. No seizures or epileptiform discharges were seen throughout the recording.  A normal interictal EEG does not exclude the diagnosis of epilepsy.  Dyanna Seiter [name]

## 2022-05-13 NOTE — TOC Transition Note (Signed)
Transition of Care [facility]) - CM/SW Discharge Note   Patient Details  Name: SHAYLE DONAHOO MRN: [id number] Date of Birth: July 15, 1934  [facility] CM/SW Contact:  Leeroy Cha, RN Phone Number: 05/13/2022, 2:09 PM   Clinical Narrative:    [id number] discharged to return home.  Chart reviewed for TOC needs.  None found.  Patient self care.   Final next level of care: Home/Self Care Barriers to Discharge: Barriers Resolved   Patient Goals and CMS Choice Patient states their goals for this hospitalization and ongoing recovery are:: to go home CMS Medicare.gov Compare Post Acute Care list provided to:: Patient    Discharge Placement                       Discharge Plan and Services   Discharge Planning Services: CM Consult                                 Social Determinants of Health (SDOH) Interventions     Readmission Risk Interventions   Row Labels 01/19/2022   11:11 AM  Readmission Risk Prevention Plan   Section Header. No data exists in this row.   Transportation Screening   Complete  PCP or Specialist Appt within 3-5 Days   Complete  HRI or [place]   Complete  Social Work Consult for [place] Planning/Counseling   Complete  Palliative Care Screening   Not Applicable  Medication Review [employer])   Complete

## 2022-05-14 ENCOUNTER — Telehealth: Payer: Medicare HMO | Admitting: Nurse Practitioner

## 2022-05-15 DIAGNOSIS — L97522 Non-pressure chronic ulcer of other part of left foot with fat layer exposed: Secondary | ICD-10-CM | POA: Diagnosis not present

## 2022-05-21 ENCOUNTER — Encounter: Payer: Self-pay | Admitting: Nurse Practitioner

## 2022-05-21 ENCOUNTER — Other Ambulatory Visit: Payer: Medicare HMO | Admitting: Nurse Practitioner

## 2022-05-21 DIAGNOSIS — R5381 Other malaise: Secondary | ICD-10-CM | POA: Diagnosis not present

## 2022-05-21 DIAGNOSIS — R634 Abnormal weight loss: Secondary | ICD-10-CM | POA: Diagnosis not present

## 2022-05-21 DIAGNOSIS — R630 Anorexia: Secondary | ICD-10-CM

## 2022-05-21 DIAGNOSIS — Z515 Encounter for palliative care: Secondary | ICD-10-CM

## 2022-05-21 NOTE — Progress Notes (Signed)
[place] Consult Note Telephone: [phone]  Fax: [phone]    Date of encounter: 05/21/22 7:28 PM PATIENT NAME: [name] [name] [address] [place] [address]   [phone] (home)  DOB: [birth year] MRN: [id number] PRIMARY CARE PROVIDER:    Lajean Manes, MD,  [address] [address] [phone]  RESPONSIBLE PARTY:    Contact Information     Name Relation Home Work [name] [name] Daughter   [phone]   [name] [name] Daughter [phone]       Due to the COVID-19 crisis, this visit was done via telemedicine from my office and it was initiated and consent by this patient and or family.   I connected with daughter, [name] [name] and  [name] [name] on 04/03/22 by telephone as video not available enabled telemedicine application and verified that I am speaking with the correct person using two identifiers.   I discussed the limitations of evaluation and management by telemedicine. The patient expressed understanding and agreed to proceed. Palliative Care was asked to follow this patient by consultation request of  [name] Manes, MD to address advance care planning and complex medical decision making. This is a follow up visit.                                  ASSESSMENT AND PLAN / RECOMMENDATIONS:  Symptom Management/Plan: 1. Advance Care Planning;  reviewed medical goals, wishes are to be DNR  PC NP to sign off due to program changes and have PC RN/PC SW continue to follow due to weight loss, possible once therapy completed if goc align with hospice possible be eligible.    2. Goals of Care: Goals include to maximize quality of life and symptom management. Our advance care planning conversation included a discussion about:    The value and importance of advance care planning  Exploration of personal, cultural or spiritual beliefs that might influence medical decisions   Exploration of goals of care in the event of a sudden injury or illness  Identification and preparation of a healthcare agent  Review and updating or creation of an advance directive document.   3. Debility/generalized weakness secondary to deconditioning, discussed monitoring activity level, encourage mobility, talked about transfers. Fall risk.   4. Anorexia with weight loss; continue to monitor appetite.  06/03/2021 weight 125 lbs 01/30/2022 weight 96 lbs 29 lbs weight loss,  No current weights;  5. Palliative care encounter; Palliative care encounter; Palliative medicine team will continue to support patient, patient's family, and medical team. Visit consisted of counseling and education dealing with the complex and emotionally intense issues of symptom management and palliative care in the setting of serious and potentially life-threatening illness    I spent 21 minutes providing this consultation. More than 50% of the time in this consultation was spent in counseling and care coordination. PPS: 40% Chief Complaint: Initial consult palliative consult for complex medical decision making, address goals, manage ongoing symptoms   HISTORY OF PRESENT ILLNESS:  [name] [name] is a 88 y.o. year old female  with multiple medical problems including HTN, DM, PAD, dermatomyositis, pacer, hypothyroidism, gerd, hypothyroidism. Hospitalized [birth year]87 yo 01/20/2022 for syncope episode, with aspiration pna, elevated troponin, fever, sepsis, dermatopolymyosis. Hospitalization 05/09/2022 to 05/13/2022 for syncope and vasovagal episodes while sitting on the toilet; Stabilized and d/c home with therapy. I called by telephone as  video not available for telemedicine f/u pc visit with daughter, [name] [name] and [name] [name], discussed PC visit. We reviewed ROS, weights, appetite, current functional abilities, goc, code status and wishes are to be DNR, [place] completed. Medical goals reviewed. Talked about role pc in poc.  [name] [name] endorses overall doing well with [name] [name] in agreement, Therapeutic listening, emotional support provided. Questions answered. Discussed with [name] Partridge,  [name] [name] doing well, PC NP to sign off due to program changes and have PC RN/PC SW continue to follow due to weight loss, possible once therapy completed if goc align with hospice possible be eligible. [name] [name] in agreement.  [name] [name] in agreement, scheduled. Therapeutic listening, emotional support provided. Questions answered.    History obtained from review of EMR, discussion with daughter, and [name] [name].  I reviewed available labs, medications, imaging, studies and related documents from the EMR.  Records reviewed and summarized above.    ROS 10 point reviewed all negative except HPI   Physical Exam: deferred Thank you for the opportunity to participate in the care of [name] [name].  The palliative care team will continue to follow. Please call our office at [phone] if we can be of additional assistance.   [name] Michna Ihor Gully, NP

## 2022-05-22 DIAGNOSIS — R54 Age-related physical debility: Secondary | ICD-10-CM | POA: Diagnosis not present

## 2022-05-22 DIAGNOSIS — Z993 Dependence on wheelchair: Secondary | ICD-10-CM | POA: Diagnosis not present

## 2022-05-25 NOTE — TOC Transition Note (Signed)
Transition of Care [facility]) - CM/SW Discharge Note   Patient Details  Name: AJIAH MCGLINN MRN: [id number] Date of Birth: 24-May-1934  Transition of Care [facility]) CM/SW Contact:  Leeroy Cha, RN Phone Number: 05/25/2022, 9:09 AM   Clinical Narrative:    Tct-home home message left for carmela to call the surgeon office =[facility]-to get any hhc added to her home care.   Final next level of care: Home/Self Care Barriers to Discharge: Barriers Resolved   Patient Goals and CMS Choice Patient states their goals for this hospitalization and ongoing recovery are:: to go home CMS Medicare.gov Compare Post Acute Care list provided to:: Patient    Discharge Placement                       Discharge Plan and Services   Discharge Planning Services: CM Consult                                 Social Determinants of Health (SDOH) Interventions     Readmission Risk Interventions   Row Labels 01/19/2022   11:11 AM  Readmission Risk Prevention Plan   Section Header. No data exists in this row.   Transportation Screening   Complete  PCP or Specialist Appt within 3-5 Days   Complete  HRI or [place]   Complete  Social Work Consult for [place] Planning/Counseling   Complete  Palliative Care Screening   Not Applicable  Medication Review [employer])   Complete

## 2022-05-27 DIAGNOSIS — L0889 Other specified local infections of the skin and subcutaneous tissue: Secondary | ICD-10-CM | POA: Diagnosis not present

## 2022-05-27 DIAGNOSIS — L011 Impetiginization of other dermatoses: Secondary | ICD-10-CM | POA: Diagnosis not present

## 2022-05-27 DIAGNOSIS — M3390 Dermatopolymyositis, unspecified, organ involvement unspecified: Secondary | ICD-10-CM | POA: Diagnosis not present

## 2022-05-29 DIAGNOSIS — L97522 Non-pressure chronic ulcer of other part of left foot with fat layer exposed: Secondary | ICD-10-CM | POA: Diagnosis not present

## 2022-05-29 DIAGNOSIS — I739 Peripheral vascular disease, unspecified: Secondary | ICD-10-CM | POA: Diagnosis not present

## 2022-05-29 DIAGNOSIS — E1151 Type 2 diabetes mellitus with diabetic peripheral angiopathy without gangrene: Secondary | ICD-10-CM | POA: Diagnosis not present

## 2022-05-29 DIAGNOSIS — L603 Nail dystrophy: Secondary | ICD-10-CM | POA: Diagnosis not present

## 2022-06-01 DIAGNOSIS — I959 Hypotension, unspecified: Secondary | ICD-10-CM | POA: Diagnosis not present

## 2022-06-03 NOTE — Progress Notes (Signed)
Carelink Summary Report / Loop Recorder 

## 2022-06-04 DIAGNOSIS — Z86010 Personal history of colonic polyps: Secondary | ICD-10-CM | POA: Diagnosis not present

## 2022-06-04 DIAGNOSIS — E782 Mixed hyperlipidemia: Secondary | ICD-10-CM | POA: Diagnosis not present

## 2022-06-04 DIAGNOSIS — Z9181 History of falling: Secondary | ICD-10-CM | POA: Diagnosis not present

## 2022-06-04 DIAGNOSIS — E1122 Type 2 diabetes mellitus with diabetic chronic kidney disease: Secondary | ICD-10-CM | POA: Diagnosis not present

## 2022-06-04 DIAGNOSIS — I129 Hypertensive chronic kidney disease with stage 1 through stage 4 chronic kidney disease, or unspecified chronic kidney disease: Secondary | ICD-10-CM | POA: Diagnosis not present

## 2022-06-04 DIAGNOSIS — M810 Age-related osteoporosis without current pathological fracture: Secondary | ICD-10-CM | POA: Diagnosis not present

## 2022-06-04 DIAGNOSIS — G40909 Epilepsy, unspecified, not intractable, without status epilepticus: Secondary | ICD-10-CM | POA: Diagnosis not present

## 2022-06-04 DIAGNOSIS — Z794 Long term (current) use of insulin: Secondary | ICD-10-CM | POA: Diagnosis not present

## 2022-06-04 DIAGNOSIS — M48061 Spinal stenosis, lumbar region without neurogenic claudication: Secondary | ICD-10-CM | POA: Diagnosis not present

## 2022-06-04 DIAGNOSIS — E1142 Type 2 diabetes mellitus with diabetic polyneuropathy: Secondary | ICD-10-CM | POA: Diagnosis not present

## 2022-06-04 DIAGNOSIS — I700 Atherosclerosis of aorta: Secondary | ICD-10-CM | POA: Diagnosis not present

## 2022-06-04 DIAGNOSIS — Z7982 Long term (current) use of aspirin: Secondary | ICD-10-CM | POA: Diagnosis not present

## 2022-06-04 DIAGNOSIS — R1310 Dysphagia, unspecified: Secondary | ICD-10-CM | POA: Diagnosis not present

## 2022-06-04 DIAGNOSIS — S91102D Unspecified open wound of left great toe without damage to nail, subsequent encounter: Secondary | ICD-10-CM | POA: Diagnosis not present

## 2022-06-04 DIAGNOSIS — S81802D Unspecified open wound, left lower leg, subsequent encounter: Secondary | ICD-10-CM | POA: Diagnosis not present

## 2022-06-04 DIAGNOSIS — E1151 Type 2 diabetes mellitus with diabetic peripheral angiopathy without gangrene: Secondary | ICD-10-CM | POA: Diagnosis not present

## 2022-06-04 DIAGNOSIS — D631 Anemia in chronic kidney disease: Secondary | ICD-10-CM | POA: Diagnosis not present

## 2022-06-04 DIAGNOSIS — M5030 Other cervical disc degeneration, unspecified cervical region: Secondary | ICD-10-CM | POA: Diagnosis not present

## 2022-06-04 DIAGNOSIS — N1832 Chronic kidney disease, stage 3b: Secondary | ICD-10-CM | POA: Diagnosis not present

## 2022-06-04 DIAGNOSIS — M1710 Unilateral primary osteoarthritis, unspecified knee: Secondary | ICD-10-CM | POA: Diagnosis not present

## 2022-06-04 DIAGNOSIS — E039 Hypothyroidism, unspecified: Secondary | ICD-10-CM | POA: Diagnosis not present

## 2022-06-10 ENCOUNTER — Ambulatory Visit: Payer: Medicare HMO | Admitting: Student

## 2022-06-10 DIAGNOSIS — L97522 Non-pressure chronic ulcer of other part of left foot with fat layer exposed: Secondary | ICD-10-CM | POA: Diagnosis not present

## 2022-06-15 ENCOUNTER — Ambulatory Visit (INDEPENDENT_AMBULATORY_CARE_PROVIDER_SITE_OTHER): Payer: Medicare HMO

## 2022-06-15 DIAGNOSIS — I6389 Other cerebral infarction: Secondary | ICD-10-CM | POA: Diagnosis not present

## 2022-06-15 DIAGNOSIS — M3390 Dermatopolymyositis, unspecified, organ involvement unspecified: Secondary | ICD-10-CM | POA: Diagnosis not present

## 2022-06-16 DIAGNOSIS — E1165 Type 2 diabetes mellitus with hyperglycemia: Secondary | ICD-10-CM | POA: Diagnosis not present

## 2022-06-16 DIAGNOSIS — E1140 Type 2 diabetes mellitus with diabetic neuropathy, unspecified: Secondary | ICD-10-CM | POA: Diagnosis not present

## 2022-06-16 DIAGNOSIS — E119 Type 2 diabetes mellitus without complications: Secondary | ICD-10-CM | POA: Diagnosis not present

## 2022-06-16 DIAGNOSIS — Z794 Long term (current) use of insulin: Secondary | ICD-10-CM | POA: Diagnosis not present

## 2022-06-16 LAB — CUP PACEART REMOTE DEVICE CHECK
Date Time Interrogation Session: 20231126231841
Implantable Pulse Generator Implant Date: 20210203

## 2022-06-18 DIAGNOSIS — E1165 Type 2 diabetes mellitus with hyperglycemia: Secondary | ICD-10-CM | POA: Diagnosis not present

## 2022-06-18 DIAGNOSIS — E1142 Type 2 diabetes mellitus with diabetic polyneuropathy: Secondary | ICD-10-CM | POA: Diagnosis not present

## 2022-06-18 DIAGNOSIS — E039 Hypothyroidism, unspecified: Secondary | ICD-10-CM | POA: Diagnosis not present

## 2022-06-18 DIAGNOSIS — Z794 Long term (current) use of insulin: Secondary | ICD-10-CM | POA: Diagnosis not present

## 2022-06-29 DIAGNOSIS — S81802D Unspecified open wound, left lower leg, subsequent encounter: Secondary | ICD-10-CM | POA: Diagnosis not present

## 2022-06-29 DIAGNOSIS — L89893 Pressure ulcer of other site, stage 3: Secondary | ICD-10-CM | POA: Diagnosis not present

## 2022-06-29 DIAGNOSIS — S91102D Unspecified open wound of left great toe without damage to nail, subsequent encounter: Secondary | ICD-10-CM | POA: Diagnosis not present

## 2022-07-06 DIAGNOSIS — R300 Dysuria: Secondary | ICD-10-CM | POA: Diagnosis not present

## 2022-07-06 DIAGNOSIS — R030 Elevated blood-pressure reading, without diagnosis of hypertension: Secondary | ICD-10-CM | POA: Diagnosis not present

## 2022-07-06 DIAGNOSIS — R64 Cachexia: Secondary | ICD-10-CM | POA: Diagnosis not present

## 2022-07-06 DIAGNOSIS — N898 Other specified noninflammatory disorders of vagina: Secondary | ICD-10-CM | POA: Diagnosis not present

## 2022-07-10 DIAGNOSIS — L97522 Non-pressure chronic ulcer of other part of left foot with fat layer exposed: Secondary | ICD-10-CM | POA: Diagnosis not present

## 2022-07-27 ENCOUNTER — Encounter (HOSPITAL_COMMUNITY): Payer: Self-pay | Admitting: *Deleted

## 2022-07-27 ENCOUNTER — Inpatient Hospital Stay (HOSPITAL_COMMUNITY)
Admission: EM | Admit: 2022-07-27 | Discharge: 2022-07-30 | DRG: 177 | Disposition: A | Discharge status: Hospice | Payer: Medicare HMO | Attending: Internal Medicine | Admitting: Internal Medicine

## 2022-07-27 ENCOUNTER — Ambulatory Visit: Payer: Medicare HMO | Attending: Internal Medicine | Admitting: Speech Pathology

## 2022-07-27 ENCOUNTER — Other Ambulatory Visit: Payer: Self-pay

## 2022-07-27 ENCOUNTER — Emergency Department (HOSPITAL_COMMUNITY): Payer: Medicare HMO

## 2022-07-27 ENCOUNTER — Other Ambulatory Visit: Payer: Self-pay | Admitting: Internal Medicine

## 2022-07-27 DIAGNOSIS — N3281 Overactive bladder: Secondary | ICD-10-CM | POA: Diagnosis not present

## 2022-07-27 DIAGNOSIS — Z1152 Encounter for screening for COVID-19: Secondary | ICD-10-CM | POA: Diagnosis not present

## 2022-07-27 DIAGNOSIS — N182 Chronic kidney disease, stage 2 (mild): Secondary | ICD-10-CM | POA: Diagnosis not present

## 2022-07-27 DIAGNOSIS — J690 Pneumonitis due to inhalation of food and vomit: Principal | ICD-10-CM | POA: Diagnosis present

## 2022-07-27 DIAGNOSIS — E7800 Pure hypercholesterolemia, unspecified: Secondary | ICD-10-CM | POA: Diagnosis not present

## 2022-07-27 DIAGNOSIS — N179 Acute kidney failure, unspecified: Secondary | ICD-10-CM | POA: Diagnosis not present

## 2022-07-27 DIAGNOSIS — R1312 Dysphagia, oropharyngeal phase: Secondary | ICD-10-CM

## 2022-07-27 DIAGNOSIS — E039 Hypothyroidism, unspecified: Secondary | ICD-10-CM | POA: Diagnosis not present

## 2022-07-27 DIAGNOSIS — Z825 Family history of asthma and other chronic lower respiratory diseases: Secondary | ICD-10-CM

## 2022-07-27 DIAGNOSIS — M3390 Dermatopolymyositis, unspecified, organ involvement unspecified: Secondary | ICD-10-CM | POA: Diagnosis present

## 2022-07-27 DIAGNOSIS — G40909 Epilepsy, unspecified, not intractable, without status epilepticus: Secondary | ICD-10-CM | POA: Diagnosis not present

## 2022-07-27 DIAGNOSIS — E119 Type 2 diabetes mellitus without complications: Secondary | ICD-10-CM

## 2022-07-27 DIAGNOSIS — E860 Dehydration: Secondary | ICD-10-CM | POA: Diagnosis present

## 2022-07-27 DIAGNOSIS — E1122 Type 2 diabetes mellitus with diabetic chronic kidney disease: Secondary | ICD-10-CM | POA: Diagnosis present

## 2022-07-27 DIAGNOSIS — E1169 Type 2 diabetes mellitus with other specified complication: Secondary | ICD-10-CM | POA: Diagnosis not present

## 2022-07-27 DIAGNOSIS — I2510 Atherosclerotic heart disease of native coronary artery without angina pectoris: Secondary | ICD-10-CM | POA: Diagnosis present

## 2022-07-27 DIAGNOSIS — G8929 Other chronic pain: Secondary | ICD-10-CM | POA: Diagnosis present

## 2022-07-27 DIAGNOSIS — F0390 Unspecified dementia without behavioral disturbance: Secondary | ICD-10-CM | POA: Diagnosis not present

## 2022-07-27 DIAGNOSIS — L299 Pruritus, unspecified: Secondary | ICD-10-CM | POA: Diagnosis not present

## 2022-07-27 DIAGNOSIS — Z79899 Other long term (current) drug therapy: Secondary | ICD-10-CM

## 2022-07-27 DIAGNOSIS — Z743 Need for continuous supervision: Secondary | ICD-10-CM | POA: Diagnosis not present

## 2022-07-27 DIAGNOSIS — M810 Age-related osteoporosis without current pathological fracture: Secondary | ICD-10-CM | POA: Diagnosis not present

## 2022-07-27 DIAGNOSIS — I152 Hypertension secondary to endocrine disorders: Secondary | ICD-10-CM | POA: Diagnosis present

## 2022-07-27 DIAGNOSIS — R4182 Altered mental status, unspecified: Secondary | ICD-10-CM | POA: Diagnosis not present

## 2022-07-27 DIAGNOSIS — I10 Essential (primary) hypertension: Secondary | ICD-10-CM | POA: Diagnosis present

## 2022-07-27 DIAGNOSIS — Z66 Do not resuscitate: Secondary | ICD-10-CM | POA: Diagnosis not present

## 2022-07-27 DIAGNOSIS — Z681 Body mass index (BMI) 19 or less, adult: Secondary | ICD-10-CM

## 2022-07-27 DIAGNOSIS — Z882 Allergy status to sulfonamides status: Secondary | ICD-10-CM

## 2022-07-27 DIAGNOSIS — L89891 Pressure ulcer of other site, stage 1: Secondary | ICD-10-CM | POA: Diagnosis present

## 2022-07-27 DIAGNOSIS — Z7189 Other specified counseling: Secondary | ICD-10-CM

## 2022-07-27 DIAGNOSIS — D649 Anemia, unspecified: Secondary | ICD-10-CM | POA: Diagnosis not present

## 2022-07-27 DIAGNOSIS — Z87891 Personal history of nicotine dependence: Secondary | ICD-10-CM

## 2022-07-27 DIAGNOSIS — G9341 Metabolic encephalopathy: Secondary | ICD-10-CM | POA: Diagnosis present

## 2022-07-27 DIAGNOSIS — Z888 Allergy status to other drugs, medicaments and biological substances status: Secondary | ICD-10-CM

## 2022-07-27 DIAGNOSIS — M545 Low back pain, unspecified: Secondary | ICD-10-CM | POA: Diagnosis present

## 2022-07-27 DIAGNOSIS — R1310 Dysphagia, unspecified: Secondary | ICD-10-CM | POA: Diagnosis present

## 2022-07-27 DIAGNOSIS — M3313 Other dermatomyositis without myopathy: Secondary | ICD-10-CM | POA: Diagnosis present

## 2022-07-27 DIAGNOSIS — E1159 Type 2 diabetes mellitus with other circulatory complications: Secondary | ICD-10-CM | POA: Diagnosis present

## 2022-07-27 DIAGNOSIS — G9340 Encephalopathy, unspecified: Secondary | ICD-10-CM

## 2022-07-27 DIAGNOSIS — J189 Pneumonia, unspecified organism: Secondary | ICD-10-CM | POA: Diagnosis not present

## 2022-07-27 DIAGNOSIS — Z833 Family history of diabetes mellitus: Secondary | ICD-10-CM

## 2022-07-27 DIAGNOSIS — Z800 Family history of malignant neoplasm of digestive organs: Secondary | ICD-10-CM

## 2022-07-27 DIAGNOSIS — Z7952 Long term (current) use of systemic steroids: Secondary | ICD-10-CM

## 2022-07-27 DIAGNOSIS — Z886 Allergy status to analgesic agent status: Secondary | ICD-10-CM

## 2022-07-27 DIAGNOSIS — I2699 Other pulmonary embolism without acute cor pulmonale: Secondary | ICD-10-CM | POA: Diagnosis not present

## 2022-07-27 DIAGNOSIS — Z87898 Personal history of other specified conditions: Secondary | ICD-10-CM

## 2022-07-27 DIAGNOSIS — Z794 Long term (current) use of insulin: Secondary | ICD-10-CM

## 2022-07-27 DIAGNOSIS — Z881 Allergy status to other antibiotic agents status: Secondary | ICD-10-CM

## 2022-07-27 DIAGNOSIS — Z515 Encounter for palliative care: Secondary | ICD-10-CM | POA: Diagnosis not present

## 2022-07-27 DIAGNOSIS — R0600 Dyspnea, unspecified: Secondary | ICD-10-CM | POA: Diagnosis not present

## 2022-07-27 DIAGNOSIS — M3320 Polymyositis, organ involvement unspecified: Secondary | ICD-10-CM | POA: Diagnosis not present

## 2022-07-27 DIAGNOSIS — R636 Underweight: Secondary | ICD-10-CM | POA: Diagnosis present

## 2022-07-27 DIAGNOSIS — Z79890 Hormone replacement therapy (postmenopausal): Secondary | ICD-10-CM

## 2022-07-27 DIAGNOSIS — K219 Gastro-esophageal reflux disease without esophagitis: Secondary | ICD-10-CM | POA: Diagnosis present

## 2022-07-27 DIAGNOSIS — R531 Weakness: Secondary | ICD-10-CM | POA: Diagnosis not present

## 2022-07-27 DIAGNOSIS — Z8249 Family history of ischemic heart disease and other diseases of the circulatory system: Secondary | ICD-10-CM

## 2022-07-27 DIAGNOSIS — R400 Somnolence: Secondary | ICD-10-CM | POA: Diagnosis not present

## 2022-07-27 LAB — CBC WITH DIFFERENTIAL/PLATELET
Abs Immature Granulocytes: 0.09 10*3/uL — ABNORMAL HIGH (ref 0.00–0.07)
Basophils Absolute: 0 10*3/uL (ref 0.0–0.1)
Basophils Relative: 0 %
Eosinophils Absolute: 0.2 10*3/uL (ref 0.0–0.5)
Eosinophils Relative: 1 %
HCT: 28.7 % — ABNORMAL LOW (ref 36.0–46.0)
Hemoglobin: 9.2 g/dL — ABNORMAL LOW (ref 12.0–15.0)
Immature Granulocytes: 1 %
Lymphocytes Relative: 5 %
Lymphs Abs: 0.6 10*3/uL — ABNORMAL LOW (ref 0.7–4.0)
MCH: 31.9 pg (ref 26.0–34.0)
MCHC: 32.1 g/dL (ref 30.0–36.0)
MCV: 99.7 fL (ref 80.0–100.0)
Monocytes Absolute: 0.5 10*3/uL (ref 0.1–1.0)
Monocytes Relative: 4 %
Neutro Abs: 9.8 10*3/uL — ABNORMAL HIGH (ref 1.7–7.7)
Neutrophils Relative %: 89 %
Platelets: 328 10*3/uL (ref 150–400)
RBC: 2.88 MIL/uL — ABNORMAL LOW (ref 3.87–5.11)
RDW: 15.8 % — ABNORMAL HIGH (ref 11.5–15.5)
WBC: 11.1 10*3/uL — ABNORMAL HIGH (ref 4.0–10.5)
nRBC: 0 % (ref 0.0–0.2)

## 2022-07-27 LAB — CBG MONITORING, ED
Glucose-Capillary: 153 mg/dL — ABNORMAL HIGH (ref 70–99)
Glucose-Capillary: 227 mg/dL — ABNORMAL HIGH (ref 70–99)

## 2022-07-27 LAB — URINALYSIS, ROUTINE W REFLEX MICROSCOPIC
Bilirubin Urine: NEGATIVE
Glucose, UA: NEGATIVE mg/dL
Hgb urine dipstick: NEGATIVE
Ketones, ur: NEGATIVE mg/dL
Leukocytes,Ua: NEGATIVE
Nitrite: NEGATIVE
Protein, ur: NEGATIVE mg/dL
Specific Gravity, Urine: 1.01 (ref 1.005–1.030)
pH: 7 (ref 5.0–8.0)

## 2022-07-27 LAB — TROPONIN I (HIGH SENSITIVITY)
Troponin I (High Sensitivity): 12 ng/L (ref <18)
Troponin I (High Sensitivity): 10 ng/L (ref <18)

## 2022-07-27 LAB — COMPREHENSIVE METABOLIC PANEL
ALT: 30 U/L (ref 0–44)
AST: 48 U/L — ABNORMAL HIGH (ref 15–41)
Albumin: 2.9 g/dL — ABNORMAL LOW (ref 3.5–5.0)
Alkaline Phosphatase: 92 U/L (ref 38–126)
Anion gap: 11 (ref 5–15)
BUN: 37 mg/dL — ABNORMAL HIGH (ref 8–23)
CO2: 28 mmol/L (ref 22–32)
Calcium: 9.8 mg/dL (ref 8.9–10.3)
Chloride: 96 mmol/L — ABNORMAL LOW (ref 98–111)
Creatinine, Ser: 1.11 mg/dL — ABNORMAL HIGH (ref 0.44–1.00)
GFR, Estimated: 48 mL/min — ABNORMAL LOW (ref >60)
Glucose, Bld: 225 mg/dL — ABNORMAL HIGH (ref 70–99)
Potassium: 4.6 mmol/L (ref 3.5–5.1)
Sodium: 135 mmol/L (ref 135–145)
Total Bilirubin: <0.1 mg/dL — ABNORMAL LOW (ref 0.3–1.2)
Total Protein: 6.4 g/dL — ABNORMAL LOW (ref 6.5–8.1)

## 2022-07-27 LAB — RESP PANEL BY RT-PCR (RSV, FLU A&B, COVID)  RVPGX2
Influenza A by PCR: NEGATIVE
Influenza B by PCR: NEGATIVE
Resp Syncytial Virus by PCR: NEGATIVE
SARS Coronavirus 2 by RT PCR: NEGATIVE

## 2022-07-27 LAB — TSH: TSH: 0.046 u[IU]/mL — ABNORMAL LOW (ref 0.350–4.500)

## 2022-07-27 LAB — LACTIC ACID, PLASMA
Lactic Acid, Venous: 1.9 mmol/L (ref 0.5–1.9)
Lactic Acid, Venous: 1.1 mmol/L (ref 0.5–1.9)

## 2022-07-27 MED ORDER — LEVETIRACETAM 500 MG PO TABS
500.0000 mg | ORAL_TABLET | Freq: Two times a day (BID) | ORAL | Status: DC
  Administered 2022-07-28 – 2022-07-30 (×5): 500 mg via ORAL
  Filled 2022-07-27 (×5): qty 1

## 2022-07-27 MED ORDER — SODIUM CHLORIDE 0.9 % IV SOLN
1.5000 g | Freq: Two times a day (BID) | INTRAVENOUS | Status: DC
  Administered 2022-07-28 – 2022-07-30 (×5): 1.5 g via INTRAVENOUS
  Filled 2022-07-27 (×5): qty 4

## 2022-07-27 MED ORDER — IOHEXOL 350 MG/ML SOLN
75.0000 mL | Freq: Once | INTRAVENOUS | Status: AC | PRN
  Administered 2022-07-27: 75 mL via INTRAVENOUS

## 2022-07-27 MED ORDER — SODIUM CHLORIDE 0.9 % IV BOLUS
1000.0000 mL | Freq: Once | INTRAVENOUS | Status: AC
  Administered 2022-07-27: 1000 mL via INTRAVENOUS

## 2022-07-27 MED ORDER — SODIUM CHLORIDE 0.9 % IV SOLN: INTRAVENOUS | Status: AC

## 2022-07-27 MED ORDER — LEVOTHYROXINE SODIUM 100 MCG PO TABS
200.0000 ug | ORAL_TABLET | Freq: Every day | ORAL | Status: DC
  Administered 2022-07-28 – 2022-07-30 (×3): 200 ug via ORAL
  Filled 2022-07-27 (×3): qty 2

## 2022-07-27 MED ORDER — ONDANSETRON HCL 4 MG PO TABS: 4.0000 mg | ORAL_TABLET | Freq: Four times a day (QID) | ORAL | Status: DC | PRN

## 2022-07-27 MED ORDER — SENNOSIDES-DOCUSATE SODIUM 8.6-50 MG PO TABS: 1.0000 | ORAL_TABLET | Freq: Every evening | ORAL | Status: DC | PRN

## 2022-07-27 MED ORDER — HYDRALAZINE HCL 20 MG/ML IJ SOLN: 10.0000 mg | Freq: Four times a day (QID) | INTRAMUSCULAR | Status: DC | PRN

## 2022-07-27 MED ORDER — ONDANSETRON HCL 4 MG/2ML IJ SOLN: 4.0000 mg | Freq: Four times a day (QID) | INTRAMUSCULAR | Status: DC | PRN

## 2022-07-27 MED ORDER — ACETAMINOPHEN 325 MG PO TABS
650.0000 mg | ORAL_TABLET | Freq: Four times a day (QID) | ORAL | Status: DC | PRN
  Administered 2022-07-29: 650 mg via ORAL
  Filled 2022-07-27: qty 2

## 2022-07-27 MED ORDER — ACETAMINOPHEN 650 MG RE SUPP: 650.0000 mg | Freq: Four times a day (QID) | RECTAL | Status: DC | PRN

## 2022-07-27 MED ORDER — PREDNISONE 10 MG PO TABS
5.0000 mg | ORAL_TABLET | Freq: Every day | ORAL | Status: DC
  Administered 2022-07-28 – 2022-07-30 (×3): 5 mg via ORAL
  Filled 2022-07-27 (×3): qty 1

## 2022-07-27 MED ORDER — HEPARIN SODIUM (PORCINE) 5000 UNIT/ML IJ SOLN
5000.0000 [IU] | Freq: Three times a day (TID) | INTRAMUSCULAR | Status: DC
  Administered 2022-07-28 – 2022-07-30 (×7): 5000 [IU] via SUBCUTANEOUS
  Filled 2022-07-27 (×7): qty 1

## 2022-07-27 MED ORDER — SODIUM CHLORIDE 0.9 % IV SOLN
3.0000 g | Freq: Once | INTRAVENOUS | Status: AC
  Administered 2022-07-27: 3 g via INTRAVENOUS
  Filled 2022-07-27: qty 8

## 2022-07-27 MED ORDER — INSULIN ASPART 100 UNIT/ML IJ SOLN
0.0000 [IU] | INTRAMUSCULAR | Status: DC
  Administered 2022-07-28: 1 [IU] via SUBCUTANEOUS
  Administered 2022-07-28 (×2): 3 [IU] via SUBCUTANEOUS
  Administered 2022-07-29: 7 [IU] via SUBCUTANEOUS
  Administered 2022-07-29 (×2): 3 [IU] via SUBCUTANEOUS
  Administered 2022-07-29: 1 [IU] via SUBCUTANEOUS
  Administered 2022-07-29: 7 [IU] via SUBCUTANEOUS
  Administered 2022-07-30: 3 [IU] via SUBCUTANEOUS
  Administered 2022-07-30: 9 [IU] via SUBCUTANEOUS
  Administered 2022-07-30: 3 [IU] via SUBCUTANEOUS
  Filled 2022-07-27: qty 0.09

## 2022-07-27 NOTE — Assessment & Plan Note (Signed)
Acute metabolic encephalopathy on background of dementia in setting of presumed aspiration pneumonia. -Continue antibiotics as above -Delirium precautions

## 2022-07-27 NOTE — Assessment & Plan Note (Signed)
Patient with dysphagia and concern for choking with aspiration during meals.  CTA with multifocal pulmonary infiltrates potentially from aspiration events. -Continue IV Unasyn -Keep n.p.o. tonight, SLP eval tomorrow -Aspiration precautions -Supplemental O2 if needed

## 2022-07-27 NOTE — Assessment & Plan Note (Signed)
Mild with creatinine up to 1.11 compared to baseline around 0.8. -Continue IV fluid hydration overnight and repeat labs in a.m. -Hold losartan

## 2022-07-27 NOTE — ED Triage Notes (Signed)
BIB EMS due to AMS. Pt is altered during triage. She responds to verbal, disoriented. Daughter states pt woke from sleep coughing and throwing up mucus then become combative.

## 2022-07-27 NOTE — Assessment & Plan Note (Signed)
Place on sensitive SSI every 4 hours while NPO.

## 2022-07-27 NOTE — Assessment & Plan Note (Signed)
Continue home [place].

## 2022-07-27 NOTE — Assessment & Plan Note (Signed)
TSH persistently suppressed.  Free T4 has been mildly elevated on prior labs.  Recheck free T4, if still elevated then consider reducing home Synthroid dose. -Continue usual Synthroid 200 mcg dose for now

## 2022-07-27 NOTE — Hospital Course (Signed)
[name] [name] is a 89 y.o. female with medical history significant for dementia, T2DM, HTN, HLD, hypothyroidism, normocytic anemia, history of seizure-like activity on Keppra, dermatomyositis on chronic prednisone and methotrexate, dysphagia, history of syncope due to autonomic dysfunction and vasovagal episodes who is admitted with aspiration pneumonia.

## 2022-07-27 NOTE — H&P (Signed)
History and Physical    [name] [name] INO:[id number] DOB: May 13, 1934 DOA: 07/27/2022  PCP: Lajean Manes, MD  Patient coming from: Home  I have personally briefly reviewed patient's old medical records in [place]  Chief Complaint: Altered mental status  HPI: [name] [name] is a 89 y.o. female with medical history significant for dementia, T2DM, HTN, HLD, hypothyroidism, normocytic anemia, history of seizure-like activity on Keppra, dermatomyositis on chronic prednisone and methotrexate, dysphagia, history of syncope due to autonomic dysfunction and vasovagal episodes who presented to the ED for evaluation of altered mental status.  History is limited from patient due to dementia and is otherwise supplemented by EDP, chart review, and daughter at bedside.  Patient has been having issues with dysphagia and choking sensation when eating meals.  Family has been concerned about aspiration.  She was seen by speech therapy earlier today.  Several hours later she was noted to have cough with thick mucus output and concern for aspiration.  She was noted to be increasingly confused from her baseline and less verbal than family are used to.  Family reports that she has been having progressive functional decline over the last month or so.  ED Course  Labs/Imaging on admission: I have personally reviewed following labs and imaging studies.  Initial vitals showed BP 165/60, pulse 71, RR 15, temp 97.7 F, SpO2 100% on room air.  Labs showed WBC 11.1, hemoglobin 9.2, platelets 328,000, sodium 135, potassium 4.6, bicarb 28, BUN 37, creatinine 1.11, serum glucose 225, troponin 12, lactic acid 1.9.  TSH 0.046.  COVID, influenza, RSV PCR negative.  Urinalysis in process.  Blood cultures in process.  Portable chest x-ray showed hyperinflated lung fields with loop recorder overlying the lower left thorax.  No acute cardiopulmonary disease.  CT head without contrast negative for acute intracranial  abnormalities.  CTA chest negative for acute PE.  Findings suggestive of residual chronic PE in the left lower lobar segmental pulmonary arteries noted.  Multivessel coronary artery calcification seen.  Multifocal peribronchial groundglass pulmonary infiltrates also noted.  Patient was given IV Unasyn due to suspicion for aspiration pneumonia.  She was also given 1 L normal saline.  The hospitalist service was consulted to admit for further evaluation and management.  Review of Systems:  Unable to obtain full review of systems due to dementia.   Past Medical History:  Diagnosis Date   Arthritis    "knees, legs" (06/10/2016)   Ascites    Chronic kidney disease    "related to my diabetes"   Chronic lower back pain    Diabetes mellitus without complication (HCC)    Eczema    GERD (gastroesophageal reflux disease)    High cholesterol    Hyperlipidemia    Hypertension    Hypothyroidism    OAB (overactive bladder)    Osteoporosis    Thyroid disease    Type II diabetes mellitus ([place])    Urticaria     Past Surgical History:  Procedure Laterality Date   ABDOMINAL HYSTERECTOMY     KNEE ARTHROSCOPY     LAPAROTOMY N/A 12/03/2016   Procedure: EXPLORATORY LAPAROTOMY, LYSIS OF ADHESIONS;  Surgeon: Armandina Gemma, MD;  Location: [facility];  Service: General;  Laterality: N/A;   LOOP RECORDER INSERTION N/A 08/23/2019   Procedure: LOOP RECORDER INSERTION;  Surgeon: Evans Lance, MD;  Location: [facility];  Service: Cardiovascular;  Laterality: N/A;   vocal cord polyps      Social History:  reports  that she has never smoked. She quit smokeless tobacco use about 39 years ago.  Her smokeless tobacco use included chew. She reports that she does not drink alcohol and does not use drugs.  Allergies  Allergen Reactions   Nsaids Other (See Comments)    No NSAIDS due to kidney function- ESPECIALLY ibuprofen or Aleve   Biaxin [Clarithromycin] Hives   Bactrim [Sulfamethoxazole-Trimethoprim]  Hives and Rash   Lisinopril Cough   Sulfa Antibiotics Hives, Rash and Other (See Comments)    NO sulfa-based meds!!     Family History  Problem Relation Age of Onset   Diabetes Mother    Hypertension Mother    Asthma Father    Diabetes Sister    Stomach cancer Sister    Diabetes Brother    Allergic rhinitis Neg Hx    Eczema Neg Hx    Urticaria Neg Hx      Prior to Admission medications   Medication Sig Start Date End Date Taking? Authorizing Provider  acetaminophen (TYLENOL) 325 MG tablet Take 2 tablets (650 mg total) by mouth every 6 (six) hours as needed for mild pain, fever or headache. 05/13/22   Cherene Altes, MD  B-D UF III MINI PEN NEEDLES 31G X 5 MM MISC USE AS DIRECTED TID 02/14/19   [provider]  BAYER LOW DOSE 81 MG EC tablet Take 81 mg by mouth daily. Swallow whole.    [provider]  betamethasone dipropionate (DIPROLENE) 0.05 % ointment Apply 1 application  topically See admin instructions. Apply to affected areas 2 times a day    [provider]  cetirizine (ZYRTEC) 10 MG tablet Take 2.5-5 mg by mouth See admin instructions. Take 2.5 mg by mouth once during the day and 5 mg at bedtime    [provider]  Cholecalciferol (VITAMIN D-3 PO) Take 1,000 Units by mouth daily.    [provider]  Famotidine (PEPCID AC PO) Take 1 tablet by mouth at bedtime.    [provider]  folic acid (FOLVITE) 1 MG tablet Take 1 mg by mouth at bedtime.    [provider]  insulin aspart (NOVOLOG FLEXPEN) 100 UNIT/ML FlexPen Inject 2-10 Units into the skin See admin instructions. Inject 2-10 units into the skin three times a day with meals, per sliding scale:  Breakfast: BGL 80-199 = 8 units; 200-299 = 9 units; 300 or greater = 10 units Lunch: BGL 80-199 = 5 units; 200-299 = 6 units; 300 or greater = 7 units Supper/evening meal: BGL 80-199 = 2 units; 200-299 = 3 units; 300 or greater = 4 units    [provider]   insulin detemir (LEVEMIR FLEXTOUCH) 100 UNIT/ML FlexPen Inject 3-7 Units into the skin See admin instructions. Inject 7 units into the skin in the morning and 3 units at bedtime if BGL is 200 or below and 4 units if BGL is greater than 200    [provider]  Iron-FA-B Cmp-C-Biot-Probiotic (FUSION PLUS) CAPS Take 1 capsule by mouth daily with breakfast. 03/02/19   [provider]  levETIRAcetam (KEPPRA) 500 MG tablet Take 1 tablet (500 mg total) by mouth 2 (two) times daily. 10/11/19   Donzetta Starch, NP  liver oil-zinc oxide (DESITIN) 40 % ointment Apply topically 3 (three) times daily. 09/16/21   Lavina Hamman, MD  loperamide (IMODIUM A-D) 2 MG tablet Take 2 mg by mouth 4 (four) times daily as needed for diarrhea or loose stools.  [provider]  losartan (COZAAR) 50 MG tablet Take 50 mg by mouth daily.    [provider]  methotrexate (RHEUMATREX) 2.5 MG tablet Take 1 tablet (2.5 mg total) by mouth once a week. Caution:Chemotherapy. Protect from light. Patient taking differently: Take 20 mg by mouth every Saturday. Caution:Chemotherapy. Protect from light. 01/27/22   Charlynne Cousins, MD  metoprolol tartrate (LOPRESSOR) 25 MG tablet Take 1 tablet (25 mg total) by mouth 2 (two) times daily. 01/20/22   Charlynne Cousins, MD  midodrine (PROAMATINE) 5 MG tablet Take 1 tablet (5 mg total) by mouth 3 (three) times daily with meals. 05/13/22   Cherene Altes, MD  omega-3 acid ethyl esters (LOVAZA) 1 g capsule Take 1 g by mouth 2 (two) times daily.    [provider]  OneTouch Delica Lancets 86P MISC IC-10 CODE:  E11.65  Check blood sugar    [provider]  ONETOUCH VERIO test strip 1 each 3 (three) times daily. 07/07/20   [provider]  polyethylene glycol (MIRALAX / GLYCOLAX) 17 g packet Take 17 g by mouth daily. Patient taking differently: Take 17 g by mouth daily as needed for mild constipation. 09/16/21   Lavina Hamman, MD   SYNTHROID 200 MCG tablet Take 200 mcg by mouth daily before breakfast. 04/23/21   [provider]    Physical Exam: Vitals:   07/27/22 2030 07/27/22 2148 07/27/22 2200 07/27/22 2232  BP: (!) 179/68 (!) 168/77 (!) 174/86 (!) 181/75  Pulse: (!) 142 69 71 70  Resp: '12 14 10 13  '$ Temp:    97.6 F (36.4 C)  TempSrc:    Oral  SpO2: 98% 100% 99% 100%   Constitutional: Elderly woman resting in bed with head elevated.  Appears fatigued but in NAD, calm, comfortable Eyes: EOMI, lids and conjunctivae normal ENMT: Mucous membranes are moist. Posterior pharynx clear of any exudate or lesions.Normal dentition.  Neck: normal, supple, no masses. Respiratory: Distant breath sounds, poor inspiratory effort.  No accessory muscle use.  Cardiovascular: Regular rate and rhythm, no murmurs / rubs / gallops. No extremity edema. 2+ pedal pulses. Abdomen: no tenderness, no masses palpated. Musculoskeletal: no clubbing / cyanosis. No joint deformity upper and lower extremities. Good ROM, no contractures. Normal muscle tone.  Skin: no rashes, lesions, ulcers. No induration Neurologic: Sensation intact. Strength equal bilaterally. Psychiatric: Alert and oriented to self.  EKG: Personally reviewed. Sinus rhythm, rate 73, PAC, no acute ischemic changes.  Assessment/Plan Principal Problem:   Aspiration pneumonia (HCC) Active Problems:   Acute metabolic encephalopathy   Acute renal failure superimposed on stage 2 chronic kidney disease (HCC)   Hypothyroidism, acquired   Insulin dependent type 2 diabetes mellitus ([place])   Hypertension associated with diabetes ([place])   History of seizure   Dermatopolymyositis, unspecified, organ involvement unspecified ([place])   Normocytic anemia   Dementia without behavioral disturbance ([place])   [name] [name] is a 89 y.o. female with medical history significant for dementia, T2DM, HTN, HLD, hypothyroidism, normocytic anemia, history of seizure-like activity on Keppra,  dermatomyositis on chronic prednisone and methotrexate, dysphagia, history of syncope due to autonomic dysfunction and vasovagal episodes who is admitted with aspiration pneumonia.  Assessment and Plan: * Aspiration pneumonia ([place]) Patient with dysphagia and concern for choking with aspiration during meals.  CTA with multifocal pulmonary infiltrates potentially from aspiration events. -Continue IV Unasyn -Keep n.p.o. tonight, SLP eval tomorrow -Aspiration precautions -Supplemental O2 if needed  Acute metabolic encephalopathy Acute  metabolic encephalopathy on background of dementia in setting of presumed aspiration pneumonia. -Continue antibiotics as above -Delirium precautions  Acute renal failure superimposed on stage 2 chronic kidney disease (HCC) Mild with creatinine up to 1.11 compared to baseline around 0.8. -Continue IV fluid hydration overnight and repeat labs in a.m. -Hold losartan  Dementia without behavioral disturbance ([place]) More confused than baseline in setting of aspiration pneumonia.  Continue delirium precautions.  Normocytic anemia Hemoglobin stable.  Dermatopolymyositis, unspecified, organ involvement unspecified ([place]) Continue home prednisone 5 mg daily.  Also on methotrexate every Saturday.  History of seizure Continue home [place].  Hypertension associated with diabetes ([place]) Losartan on hold in setting of mild AKI.  Insulin dependent type 2 diabetes mellitus ([place]) Place on sensitive SSI every 4 hours while NPO.  Hypothyroidism, acquired TSH persistently suppressed.  Free T4 has been mildly elevated on prior labs.  Recheck free T4, if still elevated then consider reducing home Synthroid dose. -Continue usual Synthroid 200 mcg dose for now  DVT prophylaxis: heparin injection 5,000 Units Start: 07/28/22 0600 Code Status: DNR, confirmed with daughter on admission Family Communication: Daughter at bedside Disposition Plan: From home, dispo pending clinical  progress Consults called: None Severity of Illness: The appropriate patient status for this patient is INPATIENT. Inpatient status is judged to be reasonable and necessary in order to provide the required intensity of service to ensure the patient's safety. The patient's presenting symptoms, physical exam findings, and initial radiographic and laboratory data in the context of their chronic comorbidities is felt to place them at high risk for further clinical deterioration. Furthermore, it is not anticipated that the patient will be medically stable for discharge from the hospital within 2 midnights of admission.   * I certify that at the point of admission it is my clinical judgment that the patient will require inpatient hospital care spanning beyond 2 midnights from the point of admission due to high intensity of service, high risk for further deterioration and high frequency of surveillance required.Zada Finders MD [facility]  If 7PM-7AM, please contact night-coverage [email or web address]  07/27/2022, 11:28 PM

## 2022-07-27 NOTE — Assessment & Plan Note (Signed)
More confused than baseline in setting of aspiration pneumonia.  Continue delirium precautions.

## 2022-07-27 NOTE — ED Provider Notes (Signed)
[facility] DEPT Provider Note   CSN: [id number] Arrival date & time: 07/27/22  1618     History  Chief Complaint  Patient presents with   Altered Mental Status    [name] [name] is a 89 y.o. female history of hypothyroidism, diabetes, dermatomyositis on chronic steroids, here presenting with near syncope and altered mental status.  Patient does have at home with her daughters.  Per one of her daughters, she had swallow test done this morning.  She then came home and she may have aspirated afterwards.  She was noted to be confused and altered.  She was also noted to have diminished breath sounds.  She is here with another daughter who also lives with her.  She states that mom was agitated initially when she woke up but now is very confused and less verbal than usual.  Denies any falls.  The history is provided by the patient.       Home Medications Prior to Admission medications   Medication Sig Start Date End Date Taking? Authorizing Provider  acetaminophen (TYLENOL) 325 MG tablet Take 2 tablets (650 mg total) by mouth every 6 (six) hours as needed for mild pain, fever or headache. 05/13/22   Cherene Altes, MD  B-D UF III MINI PEN NEEDLES 31G X 5 MM MISC USE AS DIRECTED TID 02/14/19   [provider]  BAYER LOW DOSE 81 MG EC tablet Take 81 mg by mouth daily. Swallow whole.    [provider]  betamethasone dipropionate (DIPROLENE) 0.05 % ointment Apply 1 application  topically See admin instructions. Apply to affected areas 2 times a day    [provider]  cetirizine (ZYRTEC) 10 MG tablet Take 2.5-5 mg by mouth See admin instructions. Take 2.5 mg by mouth once during the day and 5 mg at bedtime    [provider]  Cholecalciferol (VITAMIN D-3 PO) Take 1,000 Units by mouth daily.    [provider]  Famotidine (PEPCID AC PO) Take 1 tablet by mouth at bedtime.    [provider]  folic acid  (FOLVITE) 1 MG tablet Take 1 mg by mouth at bedtime.    [provider]  insulin aspart (NOVOLOG FLEXPEN) 100 UNIT/ML FlexPen Inject 2-10 Units into the skin See admin instructions. Inject 2-10 units into the skin three times a day with meals, per sliding scale:  Breakfast: BGL 80-199 = 8 units; 200-299 = 9 units; 300 or greater = 10 units Lunch: BGL 80-199 = 5 units; 200-299 = 6 units; 300 or greater = 7 units Supper/evening meal: BGL 80-199 = 2 units; 200-299 = 3 units; 300 or greater = 4 units    [provider]  insulin detemir (LEVEMIR FLEXTOUCH) 100 UNIT/ML FlexPen Inject 3-7 Units into the skin See admin instructions. Inject 7 units into the skin in the morning and 3 units at bedtime if BGL is 200 or below and 4 units if BGL is greater than 200    [provider]  Iron-FA-B Cmp-C-Biot-Probiotic (FUSION PLUS) CAPS Take 1 capsule by mouth daily with breakfast. 03/02/19   [provider]  levETIRAcetam (KEPPRA) 500 MG tablet Take 1 tablet (500 mg total) by mouth 2 (two) times daily. 10/11/19   Donzetta Starch, NP  liver oil-zinc oxide (DESITIN) 40 % ointment Apply topically 3 (three) times daily. 09/16/21   Lavina Hamman, MD  loperamide (IMODIUM A-D) 2 MG tablet Take 2 mg by mouth 4 (four)  times daily as needed for diarrhea or loose stools.    [provider]  losartan (COZAAR) 50 MG tablet Take 50 mg by mouth daily.    [provider]  methotrexate (RHEUMATREX) 2.5 MG tablet Take 1 tablet (2.5 mg total) by mouth once a week. Caution:Chemotherapy. Protect from light. Patient taking differently: Take 20 mg by mouth every Saturday. Caution:Chemotherapy. Protect from light. 01/27/22   Charlynne Cousins, MD  metoprolol tartrate (LOPRESSOR) 25 MG tablet Take 1 tablet (25 mg total) by mouth 2 (two) times daily. 01/20/22   Charlynne Cousins, MD  midodrine (PROAMATINE) 5 MG tablet Take 1 tablet (5 mg total) by mouth 3 (three) times daily with meals.  05/13/22   Cherene Altes, MD  omega-3 acid ethyl esters (LOVAZA) 1 g capsule Take 1 g by mouth 2 (two) times daily.    [provider]  OneTouch Delica Lancets 73X MISC IC-10 CODE:  E11.65  Check blood sugar    [provider]  ONETOUCH VERIO test strip 1 each 3 (three) times daily. 07/07/20   [provider]  polyethylene glycol (MIRALAX / GLYCOLAX) 17 g packet Take 17 g by mouth daily. Patient taking differently: Take 17 g by mouth daily as needed for mild constipation. 09/16/21   Lavina Hamman, MD  SYNTHROID 200 MCG tablet Take 200 mcg by mouth daily before breakfast. 04/23/21   [provider]      Allergies    Nsaids, Biaxin [clarithromycin], Bactrim [sulfamethoxazole-trimethoprim], Lisinopril, and Sulfa antibiotics    Review of Systems   Review of Systems  Respiratory:  Positive for cough and shortness of breath.   All other systems reviewed and are negative.   Physical Exam Updated Vital Signs BP (!) 141/63 (BP Location: Right Arm)   Pulse 70   Temp 97.7 F (36.5 C) (Oral)   Resp 14   SpO2 99%  Physical Exam Vitals and nursing note reviewed.  Constitutional:      Appearance: Normal appearance.  HENT:     Head: Normocephalic.     Nose: Nose normal.     Mouth/Throat:     Mouth: Mucous membranes are moist.  Eyes:     Extraocular Movements: Extraocular movements intact.     Pupils: Pupils are equal, round, and reactive to light.  Pulmonary:     Comments: Diminished bilaterally Abdominal:     General: Abdomen is flat.     Palpations: Abdomen is soft.  Musculoskeletal:        General: Normal range of motion.  Skin:    General: Skin is warm.     Capillary Refill: Capillary refill takes less than 2 seconds.  Neurological:     Mental Status: She is alert.     Comments: Demented and moving all extremities but nonverbal  Psychiatric:     Comments: Unable     ED Results / Procedures / Treatments   Labs (all labs ordered are  listed, but only abnormal results are displayed) Labs Reviewed  CULTURE, BLOOD (ROUTINE X 2)  CULTURE, BLOOD (ROUTINE X 2)  RESP PANEL BY RT-PCR (RSV, FLU A&B, COVID)  [id number]  COMPREHENSIVE METABOLIC PANEL  CBC WITH DIFFERENTIAL/PLATELET  URINALYSIS, ROUTINE W REFLEX MICROSCOPIC  TSH  LACTIC ACID, PLASMA  LACTIC ACID, PLASMA  CBG MONITORING, ED  TROPONIN I (HIGH SENSITIVITY)    EKG EKG Interpretation  Date/Time:  Monday July 27 2022 17:24:58 EST Ventricular Rate:  73 PR Interval:    QRS Duration:  101 QT Interval:  416 QTC Calculation: 459 R Axis:   39 Text Interpretation: sinus rhythm with PVC PVC new since previous Confirmed by Wandra Arthurs [phone]) on 07/27/2022 5:51:16 PM  Radiology No results found.  Procedures Procedures    Medications Ordered in ED Medications  sodium chloride 0.9 % bolus 1,000 mL (has no administration in time range)    ED Course/ Medical Decision Making/ A&P                           Medical Decision Making DEJHA KING is a 89 y.o. female here presenting with altered mental status and cough and trouble breathing.  Patient just had a swallow study done today and may have aspirated and became very altered.  Patient is difficult to arouse and at her baseline.  Concern for possible sepsis from aspiration pneumonia.  Plan to get CBC and CMP and lactate and cultures and chest x-ray and CT head.  If chest x-ray did not show any pneumonia may need CTA chest  10:44 PM Patient's white blood cell count is elevated.  Lactate is normal.  Chest x-ray is clear and CTA showed multifocal pneumonia.  I ordered Unasyn for possible aspiration pneumonia.  Hospitalist to admit for encephalopathy from pneumonia   Problems Addressed: Aspiration pneumonia, unspecified aspiration pneumonia type, unspecified laterality, unspecified part of lung ([place]): acute illness or injury Encephalopathy: acute illness or injury  Amount and/or Complexity of Data  Reviewed Labs: ordered. Decision-making details documented in ED Course. Radiology: ordered and independent interpretation performed. Decision-making details documented in ED Course.  Risk Prescription drug management.   Final Clinical Impression(s) / ED Diagnoses Final diagnoses:  None    Rx / DC Orders ED Discharge Orders     None         Drenda Freeze, MD 07/27/22 2245

## 2022-07-27 NOTE — Assessment & Plan Note (Signed)
Losartan on hold in setting of mild AKI.

## 2022-07-27 NOTE — Therapy (Signed)
OUTPATIENT SPEECH LANGUAGE PATHOLOGY SWALLOW EVALUATION   Patient Name: [name] [name] MRN: [id number] DOB:[birth year], 89 y.o., female Today's Date: 07/27/2022  PCP:  Lajean Manes MD REFERRING PROVIDER: Charlane Ferretti, MD  END OF SESSION:  End of Session - 07/27/22 1336     Visit Number 1    Number of Visits 8    Date for SLP Re-Evaluation 09/21/22    SLP Start Time 0930    SLP Stop Time  9629    SLP Time Calculation (min) 45 min    Activity Tolerance Patient tolerated treatment well             Past Medical History:  Diagnosis Date   Arthritis    "knees, legs" (06/10/2016)   Ascites    Chronic kidney disease    "related to my diabetes"   Chronic lower back pain    Diabetes mellitus without complication (HCC)    Eczema    GERD (gastroesophageal reflux disease)    High cholesterol    Hyperlipidemia    Hypertension    Hypothyroidism    OAB (overactive bladder)    Osteoporosis    Thyroid disease    Type II diabetes mellitus ([place])    Urticaria    Past Surgical History:  Procedure Laterality Date   ABDOMINAL HYSTERECTOMY     KNEE ARTHROSCOPY     LAPAROTOMY N/A 12/03/2016   Procedure: EXPLORATORY LAPAROTOMY, LYSIS OF ADHESIONS;  Surgeon: Armandina Gemma, MD;  Location: [facility];  Service: General;  Laterality: N/A;   LOOP RECORDER INSERTION N/A 08/23/2019   Procedure: LOOP RECORDER INSERTION;  Surgeon: Evans Lance, MD;  Location: [facility];  Service: Cardiovascular;  Laterality: N/A;   vocal cord polyps     Patient Active Problem List   Diagnosis Date Noted   Malnutrition of moderate degree 05/11/2022   Near syncope 05/09/2022   Aspiration pneumonia ([place]) 01/19/2022   Fever 01/16/2022   Syncope 01/13/2022   Fever, unknown origin 01/13/2022   Elevated troponin 01/13/2022   COVID-19 virus infection 09/14/2021   Syncope and collapse 09/13/2021   Dermatopolymyositis, unspecified, organ involvement unspecified ([place]) 09/13/2021   Poor appetite  09/13/2021   Hyponatremia 09/13/2021   Uncontrolled diabetes mellitus with hypoglycemia without coma, with long-term current use of insulin ([place]) 09/13/2021   Sacral ulcer ([place]) 09/13/2021   Allergic conjunctivitis of both eyes 03/15/2020   PAD (peripheral artery disease) ([place]) 10/24/2019   Malnutrition ([place]) 10/10/2019   Seizures ([place]) with prolonged postictal period 52/84/1324   Acute metabolic encephalopathy 40/04/2724   Right shoulder pain 08/21/2019   Leukocytosis 08/21/2019   Acute encephalopathy 08/18/2019   Enterocolitis 08/18/2019   Hyperglycemia 08/18/2019   Chronic kidney disease    Pressure injury of skin 12/09/2016   SBO (small bowel obstruction) s/p lysis of adhesions 12/03/2016 12/04/2016   Acute urinary retention 11/27/2016   Ascites 11/27/2016   Abdominal distension    Facial droop    Hyperkalemia 11/25/2016   Sepsis ([place]) 11/25/2016   Acute renal failure superimposed on stage 2 chronic kidney disease ([place]) 11/25/2016   Delirium 11/25/2016   Age-related osteoporosis without current pathological fracture 10/01/2016   Hyperlipidemia 06/10/2016   GERD (gastroesophageal reflux disease) 06/10/2016   Insomnia 06/10/2016   Viral gastroenteritis    Right hip pain 05/06/2015   Vaginal atrophy 11/01/2013   ECZEMA 07/24/2008   INTERMITTENT VERTIGO 07/24/2008   Constipation 11/16/2007   INSOMNIA 08/12/2007   Oral thrush 08/03/2007   Essential hypertension 02/14/2007  Allergic rhinitis due to allergen 02/14/2007   Hypothyroidism, acquired 01/26/2007   Type 2 diabetes mellitus without complication, with long-term current use of insulin ([place]) 01/26/2007   GERD 01/26/2007   DEGENERATIVE JOINT DISEASE 01/26/2007    ONSET DATE: 07/24/2022   REFERRING DIAG: R13.12 (ICD-10-CM) - Dysphagia, oropharyngeal phase  THERAPY DIAG:  Dysphagia, oropharyngeal phase - Plan: SLP plan of care cert/re-cert  Rationale for Evaluation and Treatment: Rehabilitation  SUBJECTIVE:    SUBJECTIVE STATEMENT: "She has declined overall since then" re: MBSS 04/2022 Pt accompanied by: family member Daughter Elisa, and over the phone daughter [name] PERTINENT HISTORY: Oropharyngeal dysphagia since 2022, they stopped thickening liquids. Pt with ongoing fatigue, sleeps most of the day.   PAIN:  Are you having pain?  FALLS: Has patient fallen in last 6 months?  Yes once, had HHPT, they felt she reached max potential  LIVING ENVIRONMENT: Lives with: lives with their family Lives in: House/apartment  PLOF:  Level of assistance: Needed assistance with ADLs, Needed assistance with IADLS Employment: Retired  PATIENT GOALS: To not get choked or need the heimlich   OBJECTIVE:     RECOMMENDATIONS FROM OBJECTIVE SWALLOW STUDY (MBSS/FEES):  Swallow declined as study progressed Silent Aspiration with thin liquids, consistent pharyngeal residue Objective swallow impairments: Regular solids;Dysphagia 3 (Mech soft) solids;Nectar thick liquid Liquid Administration via Cup;Straw Medication Administration Whole meds with puree Compensations Slow rate;Small sips/bites  COGNITION: Overall cognitive status: History of cognitive impairments - at baseline Areas of impairment:  Attention: Impaired: Sustained Memory: Impaired: Immediate Working Short term Production designer, theatre/television/film Awareness: Impaired: Intellectual Functional deficits:   ORAL MOTOR EXAMINATION: Overall status: Did not assess Comments: Pt inconsistently follows commands. Family reports dentures may be loose due to weight loss, not phonation or cough to command. No overt weakness  CLINICAL SWALLOW ASSESSMENT:   Current diet: regular and thin liquids Dentition: dentures (bottom) (Top) Patient directly observed with POs: Yes: dysphagia 1 (puree)  Feeding: able to feed self and needs assist Liquids provided by: teaspoon Oral phase signs and symptoms:  none, family reports signficiant pocketing at  home Pharyngeal phase signs and symptoms: suspected delayed swallow initiation, immediate throat clear, delayed throat clear, immediate cough, delayed cough, watery eyes, and sneezing    TODAY'S TREATMENT:                                                                                                                                         DATE:  07/27/22: Educated family that environmental controls, caregiver strategies will be most beneficial to Adriel due to her cognitive impairments and inconsistent alertness. Strategies include smaller snacks during awake periods rather than 3 larger meals. Maridel does well with breakfast, so no changes with this meal. Recommended Dys 2-3 in small bites to reduce choking risk, and including Dys 1 throughout the day such as full fat [place] yogurt, avocado. They are doing a great  job with offering Glucerna, sugar free powerade and supplementing foods with protein powder. Trained to use less verbal instructions and use gestures, mirror to give feedback to Stephaney that her mouth is full. They will attempt these strategies then give feedback as to what worked/did not work  PATIENT EDUCATION: Education details: See patient instructions and today's treatment Person educated: Child(ren) Education method: Explanation, Demonstration, Verbal cues, and Handouts Education comprehension: verbalized understanding, verbal cues required, and needs further education   ASSESSMENT:  CLINICAL IMPRESSION: Patient is a 89 y.o. female who was seen today for oropharyngeal dysphagia. Daughters report that swallow has declined since [facility] 04/2022.  Pt is pocketing multiple bites without swallowing and is not following their verbal cues to swallow. Carollyn self feeds but daughters occasionally feed her with reported reduced pocketing, coughing and choking, however Nahal does not consistently respond to being fed.  Zaneta has more frequent coughing with meals and thin liquids. They have  stopped thickening liquids due to dehydration, I agree. They report episode where she required heimlich and they report significant weight loss. Diet modification suggestions and feeding suggestions raised concerns with family re: blood sugar, alertness and aspiration. Educated that Emiyah is aspirating but seems to be tolerating some aspiration. At this point, we discussed pt and family goals including quality of life, adequate nutrition/hydration vs blood sugar and aspiration risks. Due to Malikah's cognition and alertness levels, goals of care were discussed, however no decision reached. I recommend skilled ST for ongoing training in compensatory strategies for safe swallow, family education on diet modifications and external swallow precautions to maximize safety and comfort of swallow. Continue palliative care.   OBJECTIVE IMPAIRMENTS: include attention, memory, awareness, and dysphagia. These impairments are limiting patient from safety when swallowing. Factors affecting potential to achieve goals and functional outcome are ability to learn/carryover information, co-morbidities, medical prognosis, previous level of function, and severity of impairments. Patient will benefit from skilled SLP services to address above impairments and improve overall function.  REHAB POTENTIAL: Fair due to cognition, inconsistent awareness   GOALS: Goals reviewed with patient? Yes   LONG TERM GOALS: Target date: 09/21/22  Family will report 25% reduction in coughing with meals/liquids subjectively over 1 week Baseline:  Goal status: INITIAL  2.  Family will carryover 3 strategies to improve ease and safety of swallow over 1 week Baseline:  Goal status: INITIAL  3.  Family will follow and Dysphagia 2-3 diet over 1 week with mod I Baseline:  Goal status: INITIAL   PLAN:  SLP FREQUENCY: 1x/week  SLP DURATION: 6 weeks  PLANNED INTERVENTIONS: Aspiration precaution training, Diet toleration management ,  Environmental controls, Trials of upgraded texture/liquids, Cueing hierachy, Internal/external aids, Multimodal communication approach, SLP instruction and feedback, Compensatory strategies, and Patient/family education    Iline Oven, [place] 07/27/2022, 1:36 PM

## 2022-07-27 NOTE — Patient Instructions (Signed)
   The doctor said no visitors during meals  Eliminate distractions during meals  You are doing a great job getting in the thicker protein drinks with extra fat and calories  Try using a mirror to show her that she has food in her mouth  Reduce extra instructions/verbalizations - keep directions short and sweet  There is a balance between getting enough nutrition/hydration, swallowing safely, blood sugar, quality of life and y'alls stress level  Let's try getting in as much calories at breakfast  Try high fat, high calorie snacks/lunch (avocado, greek yogurt mixed with protein powder  Rather than 2 full meals, 4-5 small high cal snacks are OK  Try a mirror to see if she notices she needs to swallow  Try taking the utensil until she swallows (this may be more confusing or frustrating though)  Try softer foods in small pieces (look up Dysphagia 2 and Dysphagia 3 foods on line)

## 2022-07-27 NOTE — ED Triage Notes (Signed)
BIB [facility] from home for AMS. Lives with daughter. More lethargic than usual. Arousable to verbal.  Intermittently verbal. H/o dementia. Pt had swallow test done this am. Concern for aspiration. LS rhonchi in upper lobes. EKG unremarkable. BS 256. BP 158/88.Some confused agitation PTA. No IV.

## 2022-07-27 NOTE — Progress Notes (Signed)
Carelink Summary Report / Loop Recorder 

## 2022-07-27 NOTE — Assessment & Plan Note (Signed)
Continue home prednisone 5 mg daily.  Also on methotrexate every Saturday.

## 2022-07-27 NOTE — Assessment & Plan Note (Signed)
Hemoglobin stable 

## 2022-07-28 DIAGNOSIS — Z7189 Other specified counseling: Secondary | ICD-10-CM | POA: Diagnosis not present

## 2022-07-28 DIAGNOSIS — Z515 Encounter for palliative care: Secondary | ICD-10-CM | POA: Diagnosis not present

## 2022-07-28 DIAGNOSIS — J690 Pneumonitis due to inhalation of food and vomit: Secondary | ICD-10-CM | POA: Diagnosis not present

## 2022-07-28 LAB — CBC
HCT: 29.3 % — ABNORMAL LOW (ref 36.0–46.0)
Hemoglobin: 9.7 g/dL — ABNORMAL LOW (ref 12.0–15.0)
MCH: 31.7 pg (ref 26.0–34.0)
MCHC: 33.1 g/dL (ref 30.0–36.0)
MCV: 95.8 fL (ref 80.0–100.0)
Platelets: 329 10*3/uL (ref 150–400)
RBC: 3.06 MIL/uL — ABNORMAL LOW (ref 3.87–5.11)
RDW: 15.5 % (ref 11.5–15.5)
WBC: 8.8 10*3/uL (ref 4.0–10.5)
nRBC: 0 % (ref 0.0–0.2)

## 2022-07-28 LAB — BASIC METABOLIC PANEL
Anion gap: 8 (ref 5–15)
BUN: 24 mg/dL — ABNORMAL HIGH (ref 8–23)
CO2: 27 mmol/L (ref 22–32)
Calcium: 9.6 mg/dL (ref 8.9–10.3)
Chloride: 102 mmol/L (ref 98–111)
Creatinine, Ser: 0.76 mg/dL (ref 0.44–1.00)
GFR, Estimated: >60 mL/min (ref >60)
Glucose, Bld: 85 mg/dL (ref 70–99)
Potassium: 4.1 mmol/L (ref 3.5–5.1)
Sodium: 137 mmol/L (ref 135–145)

## 2022-07-28 LAB — GLUCOSE, CAPILLARY
Glucose-Capillary: 116 mg/dL — ABNORMAL HIGH (ref 70–99)
Glucose-Capillary: 129 mg/dL — ABNORMAL HIGH (ref 70–99)
Glucose-Capillary: 136 mg/dL — ABNORMAL HIGH (ref 70–99)
Glucose-Capillary: 211 mg/dL — ABNORMAL HIGH (ref 70–99)
Glucose-Capillary: 214 mg/dL — ABNORMAL HIGH (ref 70–99)
Glucose-Capillary: 94 mg/dL (ref 70–99)

## 2022-07-28 LAB — T4, FREE: Free T4: 1.54 ng/dL — ABNORMAL HIGH (ref 0.61–1.12)

## 2022-07-28 MED ORDER — LOSARTAN POTASSIUM 50 MG PO TABS
50.0000 mg | ORAL_TABLET | Freq: Every day | ORAL | Status: DC
  Administered 2022-07-28: 50 mg via ORAL
  Filled 2022-07-28: qty 1

## 2022-07-28 MED ORDER — SODIUM CHLORIDE 0.9 % IV SOLN: INTRAVENOUS | Status: DC

## 2022-07-28 MED ORDER — TRIAMCINOLONE ACETONIDE 0.1 % EX OINT
1.0000 | TOPICAL_OINTMENT | Freq: Two times a day (BID) | CUTANEOUS | Status: DC | PRN
  Administered 2022-07-28: 1 via TOPICAL

## 2022-07-28 MED ORDER — NYSTATIN 100000 UNIT/ML MT SUSP
5.0000 mL | Freq: Four times a day (QID) | OROMUCOSAL | Status: DC
  Administered 2022-07-28 – 2022-07-30 (×6): 500000 [IU] via ORAL
  Filled 2022-07-28 (×8): qty 5

## 2022-07-28 MED ORDER — LORATADINE 10 MG PO TABS
10.0000 mg | ORAL_TABLET | Freq: Every day | ORAL | Status: DC | PRN
  Administered 2022-07-28 – 2022-07-29 (×2): 10 mg via ORAL
  Filled 2022-07-28 (×3): qty 1

## 2022-07-28 NOTE — Progress Notes (Signed)
PROGRESS NOTE  [name] [name]  RUE:[id number] DOB: 02-Sep-1933 DOA: 07/27/2022 PCP: Lajean Manes, MD   Brief Narrative: Patient is 89 year old female with history of dementia, type 2 diabetes, hypertension, hyperlipidemia, hypothyroidism, normocytic anemia ,seizure disorder on Keppra, dermatomyositis on chronic prednisone/methotrexate, dysphagia history of syncope due to autonomic dysfunction, vasovagal episodes who presented to the emergency department for the evaluation of altered mental status, brought by her daughter.  Having issues with dysphagia, choking sensation while eating meals, family concerned about aspiration.  She has progressive decline over the last month or so.  On presentation, she was mildly hypertensive, saturating fine on room air, lab work showed WBC count of 11.1.  COVID, flu negative.  Chest x-ray showed hyperinflated lung field, no acute cardiopulmonary disease.  CT head negative for any acute intracranial findings.  CT chest showed multifocal peribronchial groundglass pulmonary infiltrates.  Patient was started on Unasyn for suspicion of aspiration pneumonia.Speech consulted  Assessment & Plan:  Principal Problem:   Aspiration pneumonia ([place]) Active Problems:   Acute metabolic encephalopathy   Acute renal failure superimposed on stage 2 chronic kidney disease (HCC)   Hypothyroidism, acquired   Insulin dependent type 2 diabetes mellitus ([place])   Hypertension associated with diabetes ([place])   History of seizure   Dermatopolymyositis, unspecified, organ involvement unspecified ([place])   Normocytic anemia   Dementia without behavioral disturbance ([place])   Aspiration pneumonia: Presented with dysphagia, choking sensation.  History of chronic aspiration.  CTA showed multifocal pulmonary infiltrates likely aspiration pneumonia.  Started on Unasyn.  N.p.o. status.  Speech therapy evaluation requested.  Currently she remains on room air.  No coughing or dyspnea noted on  examination today.  Acute metabolic encephalopathy: Has dementia at baseline.  Increased confusion likely triggered by pneumonia.  Continue antibiotics.  Continue delirium precautions.  AKI in CKD stage II: Baseline creatinine is normal.  Elevated creatinine at 1.1 on presentation.  Started on gentle IV fluids. resolved  Normocytic anemia: Currently hemoglobin stable.  Dermatomyositis: Prednisone 5 mg daily.  On methotrexate every week.  History of seizure:On  Keppra  Hypertension: She was mildly hypertensive on presentation.  Losartan was held on admission.  Will restart  Insulin-dependent diabetes type 2: Currently on sliding scale insulin.  Monitor blood sugars  Hypothyroidism: Continue Synthyroid  Goals of care: Elderly patient with multiple comorbidities.  Now with aspiration pneumonia.  High chance of aspiration in the future. We consulted Palliative  care.  Patient lives with the daughter, mostly nonambulatory, on bed/sits on the couch most of the time     Pressure Injury 07/28/22 Toe (Comment  which one) Anterior;Left Stage 1 -  Intact skin with non-blanchable redness of a localized area usually over a bony prominence. (Active)  07/28/22 0205  Location: Toe (Comment  which one)  Location Orientation: Anterior;Left  Staging: Stage 1 -  Intact skin with non-blanchable redness of a localized area usually over a bony prominence.  Wound Description (Comments):   Present on Admission: Yes  Dressing Type Compression wrap 07/28/22 0219    DVT prophylaxis:heparin injection 5,000 Units Start: 07/28/22 0600     Code Status: DNR  Family Communication: Daughter is at bedside  Patient status:Inpatient  Patient is from :Home  Anticipated discharge JY:NWGN  Estimated DC date: In 1 to 2 days   Consultants: None  Procedures: None  Antimicrobials:  Anti-infectives (From admission, onward)    Start     Dose/Rate Route Frequency Ordered Stop   07/28/22 1000   ampicillin-sulbactam (UNASYN)  1.5 g in sodium chloride 0.9 % 100 mL IVPB        1.5 g 200 mL/hr over 30 Minutes Intravenous Every 12 hours 07/27/22 2325     07/27/22 2230  Ampicillin-Sulbactam (UNASYN) 3 g in sodium chloride 0.9 % 100 mL IVPB        3 g 200 mL/hr over 30 Minutes Intravenous  Once 07/27/22 2215 07/27/22 2307       Subjective: Patient seen and examined at bedside today.  Hemodynamically stable.  Lying in bed.  Comfortable.  Not coughing, not on oxygen, does not look dyspneic.  Daughter at bedside.  Pleasantly confused.  Knows that she is in the hospital.  Objective: Vitals:   07/27/22 2232 07/28/22 0217 07/28/22 0245 07/28/22 0607  BP: (!) 181/75 (!) 195/71  (!) 159/68  Pulse: 70 71  89  Resp: '13 17  17  '$ Temp: 97.6 F (36.4 C) 97.8 F (36.6 C)  97.7 F (36.5 C)  TempSrc: Oral Oral Oral Oral  SpO2: 100% 100%  97%  Weight:   49.9 kg   Height:   '5\' 6"'$  (1.676 m)     Intake/Output Summary (Last 24 hours) at 07/28/2022 0817 Last data filed at 07/28/2022 0320 Gross per 24 hour  Intake 1423.21 ml  Output --  Net 1423.21 ml   Filed Weights   07/28/22 0245  Weight: 49.9 kg    Examination:  General exam: Overall comfortable, not in distress, deconditioned, weak HEENT: PERRL Respiratory system: Diminished sounds bilaterally, no wheezes or crackles  Cardiovascular system: S1 & S2 heard, RRR.  Gastrointestinal system: Abdomen is nondistended, soft and nontender. Central nervous system: Alert and awake, obeys commands, oriented to place Extremities: No edema, no clubbing ,no cyanosis Skin: No rashes, no ulcers,no icterus     Data Reviewed: I have personally reviewed following labs and imaging studies  CBC: Recent Labs  Lab 07/27/22 1750 07/28/22 0404  WBC 11.1* 8.8  NEUTROABS 9.8*  --   HGB 9.2* 9.7*  HCT 28.7* 29.3*  MCV 99.7 95.8  PLT 328 237   Basic Metabolic Panel: Recent Labs  Lab 07/27/22 1750 07/28/22 0404  NA 135 137  K 4.6 4.1  CL 96* 102   CO2 28 27  GLUCOSE 225* 85  BUN 37* 24*  CREATININE 1.11* 0.76  CALCIUM 9.8 9.6     Recent Results (from the past 240 hour(s))  Resp panel by RT-PCR (RSV, Flu A&B, Covid) Anterior Nasal Swab     Status: None   Collection Time: 07/27/22  5:35 PM   Specimen: Anterior Nasal Swab  Result Value Ref Range Status   SARS Coronavirus 2 by RT PCR NEGATIVE NEGATIVE Final    Comment: (NOTE) SARS-CoV-2 target nucleic acids are NOT DETECTED.  The SARS-CoV-2 RNA is generally detectable in upper respiratory specimens during the acute phase of infection. The lowest concentration of SARS-CoV-2 viral copies this assay can detect is 138 copies/mL. A negative result does not preclude SARS-Cov-2 infection and should not be used as the sole basis for treatment or other patient management decisions. A negative result may occur with  improper specimen collection/handling, submission of specimen other than nasopharyngeal swab, presence of viral mutation(s) within the areas targeted by this assay, and inadequate number of viral copies(<138 copies/mL). A negative result must be combined with clinical observations, patient history, and epidemiological information. The expected result is Negative.  Fact Sheet for Patients:  EntrepreneurPulse.com.au  Fact Sheet for Healthcare Providers:  IncredibleEmployment.be  This  test is no t yet approved or cleared by the [place] and  has been authorized for detection and/or diagnosis of SARS-CoV-2 by FDA under an Emergency Use Authorization (EUA). This EUA will remain  in effect (meaning this test can be used) for the duration of the COVID-19 declaration under Section 564(b)(1) of the Act, 21 U.S.C.section 360bbb-3(b)(1), unless the authorization is terminated  or revoked sooner.       Influenza A by PCR NEGATIVE NEGATIVE Final   Influenza B by PCR NEGATIVE NEGATIVE Final    Comment: (NOTE) The Xpert Xpress  SARS-CoV-2/FLU/RSV plus assay is intended as an aid in the diagnosis of influenza from Nasopharyngeal swab specimens and should not be used as a sole basis for treatment. Nasal washings and aspirates are unacceptable for Xpert Xpress SARS-CoV-2/FLU/RSV testing.  Fact Sheet for Patients: EntrepreneurPulse.com.au  Fact Sheet for Healthcare Providers: IncredibleEmployment.be  This test is not yet approved or cleared by the [place] FDA and has been authorized for detection and/or diagnosis of SARS-CoV-2 by FDA under an Emergency Use Authorization (EUA). This EUA will remain in effect (meaning this test can be used) for the duration of the COVID-19 declaration under Section 564(b)(1) of the Act, 21 U.S.C. section 360bbb-3(b)(1), unless the authorization is terminated or revoked.     Resp Syncytial Virus by PCR NEGATIVE NEGATIVE Final    Comment: (NOTE) Fact Sheet for Patients: EntrepreneurPulse.com.au  Fact Sheet for Healthcare Providers: IncredibleEmployment.be  This test is not yet approved or cleared by the [place] FDA and has been authorized for detection and/or diagnosis of SARS-CoV-2 by FDA under an Emergency Use Authorization (EUA). This EUA will remain in effect (meaning this test can be used) for the duration of the COVID-19 declaration under Section 564(b)(1) of the Act, 21 U.S.C. section 360bbb-3(b)(1), unless the authorization is terminated or revoked.  Performed at [facility], [place] [address] [place], [place] [address]   Blood culture (routine x 2)     Status: None (Preliminary result)   Collection Time: 07/27/22  5:45 PM   Specimen: BLOOD  Result Value Ref Range Status   Specimen Description   Final    BLOOD BLOOD LEFT FOREARM Performed at [facility] [address], [place], [place] [address]    Special Requests   Final    BOTTLES DRAWN  AEROBIC AND ANAEROBIC Blood Culture results may not be optimal due to an inadequate volume of blood received in culture bottles Performed at [facility] [address], [place], [place] [address]    Culture   Final    NO GROWTH < 12 HOURS Performed at [facility] [address] [place], [place] [address]    Report Status PENDING  Incomplete  Blood culture (routine x 2)     Status: None (Preliminary result)   Collection Time: 07/27/22  5:50 PM   Specimen: BLOOD  Result Value Ref Range Status   Specimen Description   Final    BLOOD BLOOD RIGHT FOREARM Performed at [facility] [address] [place], [place] [address]    Special Requests   Final    BOTTLES DRAWN AEROBIC AND ANAEROBIC Blood Culture results may not be optimal due to an inadequate volume of blood received in culture bottles Performed at [facility] [address], [place], [place] [address]    Culture   Final    NO GROWTH < 12 HOURS Performed at [facility], [place] [address], [place], [place] [address]    Report Status PENDING  Incomplete     Radiology Studies: CT Angio Chest PE W and/or Wo Contrast  Result Date: 07/27/2022 CLINICAL DATA:  Pulmonary embolism (PE) suspected, high prob. Dyspnea, altered mental status EXAM: CT ANGIOGRAPHY CHEST WITH CONTRAST TECHNIQUE: Multidetector CT imaging of the chest was performed using the standard protocol during bolus administration of intravenous contrast. Multiplanar CT image reconstructions and MIPs were obtained to evaluate the vascular anatomy. RADIATION DOSE REDUCTION: This exam was performed according to the departmental dose-optimization program which includes automated exposure control, adjustment of the mA and/or kV according to patient size and/or use of iterative reconstruction technique. CONTRAST:  18m OMNIPAQUE IOHEXOL 350 MG/ML SOLN COMPARISON:  None Available. FINDINGS: Cardiovascular: There is  adequate opacification the pulmonary arterial tree. Eccentric filling defect and web within the left lower lobar segmental pulmonary arteries are in keeping with the residua of chronic pulmonary embolism. No acute pulmonary emboli are identified, however. Central pulmonary arteries are of normal caliber. Moderate multi-vessel coronary artery calcification. Global cardiac size within normal limits. No pericardial effusion. Mild atherosclerotic calcification within the thoracic aorta. No aortic aneurysm. Mediastinum/Nodes: No enlarged mediastinal, hilar, or axillary lymph nodes. Thyroid gland, trachea, and esophagus demonstrate no significant findings. Lungs/Pleura: The lungs are symmetrically well expanded. Multifocal peribronchial ground-glass pulmonary infiltrate is present, best appreciated within the right mid lung zone and left lower lobe most in keeping with changes of multifocal pneumonia in the appropriate clinical setting. No pneumothorax or pleural effusion. No central obstructing lesion. Upper Abdomen: No acute abnormality. Musculoskeletal: No acute bone abnormality. No lytic or blastic bone lesion. Implanted loop recorder seen within the a left anterior chest wall. Review of the MIP images confirms the above findings. IMPRESSION: 1. No acute pulmonary embolism. 2. Eccentric filling defect and web within the left lower lobar segmental pulmonary arteries in keeping with the residua of chronic pulmonary embolism. 3. Moderate multi-vessel coronary artery calcification. 4. Multifocal peribronchial ground-glass pulmonary infiltrate most in keeping with changes of multifocal pneumonia in the appropriate clinical setting. Aortic Atherosclerosis (ICD10-I70.0). Electronically Signed   By: AFidela SalisburyM.D.   On: 07/27/2022 22:03   CT HEAD WO CONTRAST (5MM)  Result Date: 07/27/2022 CLINICAL DATA:  Mental status change of unknown cause. EXAM: CT HEAD WITHOUT CONTRAST TECHNIQUE: Contiguous axial images were  obtained from the base of the skull through the vertex without intravenous contrast. RADIATION DOSE REDUCTION: This exam was performed according to the departmental dose-optimization program which includes automated exposure control, adjustment of the mA and/or kV according to patient size and/or use of iterative reconstruction technique. COMPARISON:  05/09/2022. FINDINGS: Brain: No evidence of acute infarction, hemorrhage, hydrocephalus, extra-axial collection or mass lesion/mass effect. Vascular: No hyperdense vessel or unexpected calcification. Skull: Normal. Negative for fracture or focal lesion. Sinuses/Orbits: Globes and orbits are unremarkable. Visualized sinuses are clear. Other: None. IMPRESSION: 1. No acute intracranial abnormalities. No change from the prior study. Electronically Signed   By: DLajean ManesM.D.   On: 07/27/2022 21:11   DG Chest Port 1 View  Result Date: 07/27/2022 CLINICAL DATA:  Altered mental status. EXAM: PORTABLE CHEST 1 VIEW COMPARISON:  X-ray 05/09/2022 and older FINDINGS: Hyperinflation. No consolidation, pneumothorax or effusion. No edema. Normal cardiopericardial silhouette. Calcified aorta. Overlapping cardiac leads. Osteopenia. Loop recorder overlying the lower left thorax. IMPRESSION: Hyperinflation.  No acute cardiopulmonary disease. Electronically Signed   By: AJill SideM.D.   On: 07/27/2022  18:18    Scheduled Meds:  heparin  5,000 Units Subcutaneous Q8H   insulin aspart  0-9 Units Subcutaneous Q4H   levETIRAcetam  500 mg Oral BID   levothyroxine  200 mcg Oral Q0600   predniSONE  5 mg Oral Q breakfast   Continuous Infusions:  sodium chloride 100 mL/hr at 07/28/22 0320   ampicillin-sulbactam (UNASYN) IV       LOS: 1 day   Shelly Coss, MD [facility] P1/03/2023, 8:17 AM

## 2022-07-28 NOTE — Consult Note (Signed)
Consultation Note Date: 07/28/2022   Patient Name: [name] [name]  DOB: [birth year]  MRN: [id number]  Age / Sex: 89 y.o., female  PCP: Lajean Manes, MD Referring Physician: Shelly Coss, MD  Reason for Consultation: {Reason for Consult:23484}  HPI/Patient Profile: 89 y.o. female  with past medical history of *** admitted on 07/27/2022 with ***.   Clinical Assessment and Goals of Care: ***  {Primary Decision PJASN:05397}    SUMMARY OF RECOMMENDATIONS   DNR Modified barium swallow to be completed on 07-29-2022 Call placed and briefly introduced scope of palliative services to both the patient's daughters today at the time of initial palliative consultation, PMT to follow-up on 07-29-2022 after MBS results are available for continuing goals of care discussions. Thank you for the consult. Code Status/Advance Care Planning: {Palliative Code status:23503}   Symptom Management:  ***  Palliative Prophylaxis:  {Palliative Prophylaxis:21015}  Additional Recommendations (Limitations, Scope, Preferences): {Recommended Scope and Preferences:21019}  Psycho-social/Spiritual:  Desire for further Chaplaincy support:{YES NO:22349} Additional Recommendations: {PAL SOCIAL:21064}  Prognosis:  {Palliative Care Prognosis:23504}  Discharge Planning: {Palliative dispostion:23505}      Primary Diagnoses: Present on Admission:  Aspiration pneumonia ([place])  Acute metabolic encephalopathy  Acute renal failure superimposed on stage 2 chronic kidney disease (HCC)  Dermatopolymyositis, unspecified, organ involvement unspecified ([place])  Hypertension associated with diabetes ([place])  Hypothyroidism, acquired  Normocytic anemia  Dementia without behavioral disturbance ([place])   I have reviewed the medical record, interviewed the patient and family, and examined the patient. The following aspects are  pertinent.  Past Medical History:  Diagnosis Date   Arthritis    "knees, legs" (06/10/2016)   Ascites    Chronic kidney disease    "related to my diabetes"   Chronic lower back pain    Diabetes mellitus without complication (HCC)    Eczema    GERD (gastroesophageal reflux disease)    High cholesterol    Hyperlipidemia    Hypertension    Hypothyroidism    OAB (overactive bladder)    Osteoporosis    Thyroid disease    Type II diabetes mellitus ([place])    Urticaria    Social History   Socioeconomic History   Marital status: Widowed    Spouse name: Not on file   Number of children: 5   Years of education: 69   Highest education level: Not on file  Occupational History   Not on file  Tobacco Use   Smoking status: Never   Smokeless tobacco: Former    Types: Chew    Quit date: 1985  Vaping Use   Vaping Use: Never used  Substance and Sexual Activity   Alcohol use: No   Drug use: No   Sexual activity: Not Currently    Birth control/protection: Surgical    Comment: hysterectomy  Other Topics Concern   Not on file  Social History Narrative   06/03/21 lives alone   Social Determinants of Health   Financial Resource Strain: Not on file  Food Insecurity: No Food Insecurity (07/28/2022)  Hunger Vital Sign    Worried About Running Out of Food in the Last Year: Never true    Ran Out of Food in the Last Year: Never true  Transportation Needs: No Transportation Needs (07/28/2022)   PRAPARE - Hydrologist (Medical): No    Lack of Transportation (Non-Medical): No  Physical Activity: Not on file  Stress: Not on file  Social Connections: Not on file   Family History  Problem Relation Age of Onset   Diabetes Mother    Hypertension Mother    Asthma Father    Diabetes Sister    Stomach cancer Sister    Diabetes Brother    Allergic rhinitis Neg Hx    Eczema Neg Hx    Urticaria Neg Hx    Scheduled Meds:  heparin  5,000 Units Subcutaneous Q8H    insulin aspart  0-9 Units Subcutaneous Q4H   levETIRAcetam  500 mg Oral BID   levothyroxine  200 mcg Oral Q0600   losartan  50 mg Oral Daily   predniSONE  5 mg Oral Q breakfast   Continuous Infusions:  ampicillin-sulbactam (UNASYN) IV 1.5 g (07/28/22 1027)   PRN Meds:.acetaminophen **OR** acetaminophen, hydrALAZINE, ondansetron **OR** ondansetron (ZOFRAN) IV, senna-docusate, triamcinolone ointment Medications Prior to Admission:  Prior to Admission medications   Medication Sig Start Date End Date Taking? Authorizing Provider  acetaminophen (TYLENOL) 650 MG CR tablet Take 1,300 mg by mouth every 12 (twelve) hours.   Yes [provider]  BAYER LOW DOSE 81 MG EC tablet Take 81 mg by mouth daily. Swallow whole.   Yes [provider]  cetirizine (ZYRTEC) 10 MG tablet Take 5 mg by mouth daily as needed (itching).   Yes [provider]  Cholecalciferol (VITAMIN D-3 PO) Take 1,000 Units by mouth daily.   Yes [provider]  Famotidine (PEPCID AC PO) Take 1 tablet by mouth at bedtime.   Yes [provider]  folic acid (FOLVITE) 1 MG tablet Take 1 mg by mouth at bedtime.   Yes [provider]  insulin aspart (NOVOLOG FLEXPEN) 100 UNIT/ML FlexPen Inject 2-10 Units into the skin See admin instructions. Inject 2-10 units into the skin three times a day with meals, per sliding scale:  Breakfast: BGL 80-199 = 8 units; 200-299 = 9 units; 300 or greater = 10 units Lunch: BGL 80-199 = 5 units; 200-299 = 6 units; 300 or greater = 7 units Supper/evening meal: BGL 80-199 = 2 units; 200-299 = 3 units; 300 or greater = 4 units   Yes [provider]  insulin detemir (LEVEMIR FLEXTOUCH) 100 UNIT/ML FlexPen Inject 3-7 Units into the skin See admin instructions. Inject 7 units into the skin in the morning and 3 units at bedtime if BGL is 200 or below and 4 units if BGL is greater than 200   Yes [provider]  Iron-FA-B Cmp-C-Biot-Probiotic  (FUSION PLUS) CAPS Take 1 capsule by mouth daily with breakfast. 03/02/19  Yes [provider]  levETIRAcetam (KEPPRA) 500 MG tablet Take 1 tablet (500 mg total) by mouth 2 (two) times daily. 10/11/19  Yes Donzetta Starch, NP  liver oil-zinc oxide (DESITIN) 40 % ointment Apply topically 3 (three) times daily. 09/16/21  Yes Lavina Hamman, MD  loperamide (IMODIUM A-D) 2 MG tablet Take 2 mg by mouth 4 (four) times daily as needed for diarrhea or loose stools.   Yes [provider]  losartan (COZAAR) 50 MG tablet Take  50 mg by mouth daily.   Yes [provider]  methotrexate (RHEUMATREX) 2.5 MG tablet Take 1 tablet (2.5 mg total) by mouth once a week. Caution:Chemotherapy. Protect from light. Patient taking differently: Take 15 mg by mouth every Saturday. Caution:Chemotherapy. Protect from light. 01/27/22  Yes Charlynne Cousins, MD  metoprolol tartrate (LOPRESSOR) 25 MG tablet Take 1 tablet (25 mg total) by mouth 2 (two) times daily. 01/20/22  Yes Charlynne Cousins, MD  midodrine (PROAMATINE) 5 MG tablet Take 1 tablet (5 mg total) by mouth 3 (three) times daily with meals. Patient taking differently: Take 5 mg by mouth 2 (two) times daily with a meal. 05/13/22  Yes Cherene Altes, MD  Omega 3 1000 MG CAPS Take 1,000 mg by mouth daily.   Yes [provider]  polyethylene glycol (MIRALAX / GLYCOLAX) 17 g packet Take 17 g by mouth daily. Patient taking differently: Take 17 g by mouth daily as needed for mild constipation. 09/16/21  Yes Lavina Hamman, MD  predniSONE (DELTASONE) 5 MG tablet Take 5 mg by mouth daily with breakfast.   Yes [provider]  SYNTHROID 200 MCG tablet Take 200 mcg by mouth daily before breakfast. 04/23/21  Yes [provider]  triamcinolone ointment (KENALOG) 0.1 % Apply 1 Application topically 2 (two) times daily as needed (itching, rash). 07/16/22  Yes [provider]  B-D UF III MINI PEN NEEDLES 31G X 5 MM MISC  USE AS DIRECTED TID 02/14/19   [provider]  OneTouch Delica Lancets 40X MISC IC-10 CODE:  E11.65  Check blood sugar    [provider]  ONETOUCH VERIO test strip 1 each 3 (three) times daily. 07/07/20   [provider]   Allergies  Allergen Reactions   Nsaids Other (See Comments)    No NSAIDS due to kidney function- ESPECIALLY ibuprofen or Aleve   Biaxin [Clarithromycin] Hives   Bactrim [Sulfamethoxazole-Trimethoprim] Hives and Rash   Lisinopril Cough   Sulfa Antibiotics Hives, Rash and Other (See Comments)    NO sulfa-based meds!!    Review of Systems  Physical Exam  Vital Signs: BP (!) 163/68 (BP Location: Left Arm)   Pulse 99   Temp 98.4 F (36.9 C)   Resp 15   Ht '5\' 6"'$  (1.676 m)   Wt 49.9 kg   SpO2 93%   BMI 17.76 kg/m  Pain Scale: 0-10   Pain Score: 0-No pain   SpO2: SpO2: 93 % O2 Device:SpO2: 93 % O2 Flow Rate: .   IO: Intake/output summary:  Intake/Output Summary (Last 24 hours) at 07/28/2022 1309 Last data filed at 07/28/2022 0320 Gross per 24 hour  Intake 1423.21 ml  Output --  Net 1423.21 ml    LBM: Last BM Date : 07/26/22 Baseline Weight: Weight: 49.9 kg Most recent weight: Weight: 49.9 kg     Palliative Assessment/Data:     Time In: *** Time Out: *** Time Total: *** Greater than 50%  of this time was spent counseling and coordinating care related to the above assessment and plan.  Signed by: Loistine Chance, MD   Please contact Palliative Medicine Team phone at [phone] for questions and concerns.  For individual provider: See Shea Evans

## 2022-07-28 NOTE — Evaluation (Signed)
Occupational Therapy Evaluation Patient Details Name: [name] [name] MRN: [id number] DOB: [birth year] Today's Date: 07/28/2022   History of Present Illness  (89 yr old female admitted with AMS & found to have aspiration PNA. PMH: DM, HTN, dysphagia, autonomic dysfunction, arthritis, OP, dementia)   Clinical Impression   Pt  was oriented to person and place, however not time and situation. She needed assist for problem solving more complex tasks, as well as needed occasional repetition for following multi-step commands. She appeared to have some R sided weakness, presumably reflective of her baseline. She required assist for bed mobility, sit to stand with RW, and to stand-pivot to the bedside commode for toileting. She appears to have good family assistance and support, therefore home health OT is recommended, as long as her family can manage her care and functional needs. OT will continue to follow her for further services during her acute hospital stay, to maximize her ADL performance and to decrease the risk for progressive weakness and deconditioning.      Recommendations for follow up therapy are one component of a multi-disciplinary discharge planning process, led by the attending physician.  Recommendations may be updated based on patient status, additional functional criteria and insurance authorization.   Follow Up Recommendations  Home health OT with family assist and supervision, if pt's family can manage pt's care needs. If unable to manage, then short-tern SNF is recommended     Assistance Recommended at Discharge Frequent or constant Supervision/Assistance  Patient can return home with the following Assist for transportation;Assistance with cooking/housework;Direct supervision/assist for medications management;A lot of help with bathing/dressing/bathroom;A lot of help with walking and/or transfers    Functional Status Assessment  Patient has had a recent decline in their  functional status and demonstrates the ability to make significant improvements in function in a reasonable and predictable amount of time.  Equipment Recommendations  None recommended by OT       Precautions / Restrictions Precautions Precautions: Fall Restrictions Weight Bearing Restrictions: No      Mobility Bed Mobility   Bed Mobility: Supine to Sit     Supine to sit: Mod assist          Transfers Overall transfer level: Needs assistance Equipment used: Rolling walker (2 wheels) Transfers: Sit to/from Stand, Bed to chair/wheelchair/BSC Sit to Stand: Mod assist, From elevated surface Stand pivot transfers: Mod assist         General transfer comment:  (required cues for walker placement, body posture and reaching back prior to sitting)      ADL either performed or assessed with clinical judgement   ADL Overall ADL's : Needs assistance/impaired Eating/Feeding: Minimal assistance;Bed level Eating/Feeding Details (indicate cue type and reason): based on clinical judgement Grooming: Minimal assistance;Bed level Grooming Details (indicate cue type and reason): based on clinical judgement         Upper Body Dressing : Maximal assistance;Sitting   Lower Body Dressing: Maximal assistance   Toilet Transfer: Moderate assistance;BSC/3in1;Rolling walker (2 wheels);Stand-pivot;Cueing for safety                                Pertinent Vitals/Pain Pain Assessment Pain Assessment: No/denies pain     Hand Dominance Right   Extremity/Trunk Assessment Upper Extremity Assessment Upper Extremity Assessment: BUE hand and elbow AROM WFL. Pt appeared to have baseline R sided weakness to some extent    Lower Extremity Assessment Lower Extremity Assessment: Generalized  weakness       Communication Communication Communication: Expressive difficulties (intermittent)   Cognition Arousal/Alertness: Awake/alert   Overall Cognitive Status: History of  cognitive impairments - at baseline              General Comments:  (slight cognitive processing deficits, able to follow 1 step commands with occasional repetition, difficulty with complex/multi step commands and complex problem solving tasks, oriented to person and place, disoriented to time and situation)                Home Living Family/patient expects to be discharged to:: Private residence Living Arrangements: Children Available Help at Discharge: Family Type of Home: House Home Access: Stairs to enter Technical brewer of Steps: 2 Entrance Stairs-Rails: None Home Layout: One level     Bathroom Shower/Tub: Walk-in shower         Home Equipment: Conservation officer, nature (2 wheels);Shower seat - built in;Wheelchair - manual;BSC/3in1   Additional Comments: Pt is never alone, as she stays with one of her daughter's a couple days out of the week & her other daughter comes to the pt's home and stays the other days. Info contained here was taken from a prior therapy evaluation from a couple months ago.       Prior Functioning/Environment Prior Level of Function : Needs assist             Mobility Comments: Family assists with mobility for safety; pt uses a RW for household ambulation (shorter distances recently) ADLs Comments: Pt requires assist for bathing, dressing,  and toileting hygiene from daughters; her daughters also manage the cooking and cleaning. Pt does not drive.        OT Problem List: Decreased strength;Decreased activity tolerance;Impaired balance (sitting and/or standing);Decreased cognition;Decreased knowledge of use of DME or AE      OT Treatment/Interventions: Self-care/ADL training;Therapeutic activities;Therapeutic exercise;Cognitive remediation/compensation;Energy conservation;Patient/family education;DME and/or AE instruction;Balance training    OT Goals(Current goals can be found in the care plan section) Acute Rehab OT Goals Patient Stated  Goal: to go home soon OT Goal Formulation: With patient Time For Goal Achievement: 08/11/22 Potential to Achieve Goals: Fair ADL Goals Pt Will Perform Grooming: with set-up;with supervision;sitting Pt Will Perform Upper Body Dressing: with min assist;sitting Pt Will Transfer to Toilet: with min guard assist;ambulating Pt/caregiver will Perform Home Exercise Program: Left upper extremity;Right Upper extremity;Increased strength;With theraband;With Supervision  OT Frequency: Min 2X/week       AM-PAC OT "6 Clicks" Daily Activity     Outcome Measure Help from another person eating meals?: A Little Help from another person taking care of personal grooming?: A Little Help from another person toileting, which includes using toliet, bedpan, or urinal?: A Lot Help from another person bathing (including washing, rinsing, drying)?: A Lot Help from another person to put on and taking off regular upper body clothing?: A Lot Help from another person to put on and taking off regular lower body clothing?: A Lot 6 Click Score: 14   End of Session Equipment Utilized During Treatment: Rolling walker (2 wheels) Nurse Communication: Mobility status  Activity Tolerance: Other (comment) (Fair tolerance) Patient left: Other (comment) (Pt left in [facility] with rehab tech and PT present in room)  OT Visit Diagnosis: Unsteadiness on feet (R26.81);Muscle weakness (generalized) (M62.81)                Time: 3846-6599 OT Time Calculation (min): 23 min Charges:  OT General Charges $OT Visit: 1 Visit OT Evaluation $OT  Eval Moderate Complexity: 1 Mod OT Treatments $Therapeutic Activity: 8-22 mins    Leota Sauers, OTR/L 07/28/2022, 4:53 PM

## 2022-07-28 NOTE — Evaluation (Signed)
Physical Therapy Evaluation Patient Details Name: [name] [name] MRN: [id number] DOB: 1933-12-06 Today's Date: 07/28/2022  History of Present Illness  89 y.o. female adm with altered mental status, CT chest showed multifocal peribronchial groundglass pulmonary infiltrates. admitted with aspiration pna.  medical history significant for dementia, T2DM, HTN, HLD, hypothyroidism, normocytic anemia, history of seizure-like activity on Keppra, dermatomyositis on chronic prednisone and methotrexate, dysphagia, history of syncope due to autonomic dysfunction and vasovagal episodes  Clinical Impression  Pt admitted with above diagnosis.  Pt agreeable to OOB and very short distance amb, per chart/dtr pt has been amb shorter distances/more sedentary lately. Will benefit from HHPT if dtr in agreement. Will follow and progress as able  in acute setting   Pt currently with functional limitations due to the deficits listed below (see PT Problem List). Pt will benefit from skilled PT to increase their independence and safety with mobility to allow discharge to the venue listed below.          Recommendations for follow up therapy are one component of a multi-disciplinary discharge planning process, led by the attending physician.  Recommendations may be updated based on patient status, additional functional criteria and insurance authorization.  Follow Up Recommendations Home health PT (vs no f/u)      Assistance Recommended at Discharge Frequent or constant Supervision/Assistance  Patient can return home with the following  A little help with walking and/or transfers;A little help with bathing/dressing/bathroom;Assistance with cooking/housework;Assist for transportation;Direct supervision/assist for financial management;Help with stairs or ramp for entrance;Direct supervision/assist for medications management    Equipment Recommendations None recommended by PT  Recommendations for Other Services        Functional Status Assessment Patient has had a recent decline in their functional status and demonstrates the ability to make significant improvements in function in a reasonable and predictable amount of time.     Precautions / Restrictions Precautions Precautions: Fall Restrictions Weight Bearing Restrictions: No      Mobility  Bed Mobility Overal bed mobility: Needs Assistance Bed Mobility: Sit to Supine       Sit to supine: Mod assist   General bed mobility comments: assist to lift LEs on to bed and control trunk descent    Transfers Overall transfer level: Needs assistance Equipment used: Rolling walker (2 wheels) Transfers: Sit to/from Stand, Bed to chair/wheelchair/BSC Sit to Stand: Min assist   Step pivot transfers: Min assist, +2 safety/equipment       General transfer comment: hand over hand for proper hand placement and safety. assist to balance and maneuver RW with wt shifting    Ambulation/Gait Ambulation/Gait assistance: Min assist Gait Distance (Feet): 6 Feet   Gait Pattern/deviations: Step-to pattern, Shuffle       General Gait Details: assist to balance and progress RW forward. pt declines to walk greater distance  Stairs            Wheelchair Mobility    Modified Rankin (Stroke Patients Only)       Balance Overall balance assessment: Needs assistance Sitting-balance support: Feet supported, No upper extremity supported Sitting balance-Leahy Scale: Fair     Standing balance support: During functional activity, Bilateral upper extremity supported, Reliant on assistive device for balance Standing balance-Leahy Scale: Poor                               Pertinent Vitals/Pain Pain Assessment Pain Assessment: Faces Faces Pain Scale: No hurt  Home Living Family/patient expects to be discharged to:: Private residence Living Arrangements: Children Available Help at Discharge: Family;Available 24 hours/day Type of  Home: House Home Access: Stairs to enter Entrance Stairs-Rails: None Entrance Stairs-Number of Steps: 2   Home Layout: One level Home Equipment: Conservation officer, nature (2 wheels);Shower seat - built in;Wheelchair - manual;BSC/3in1 Additional Comments: Pt is never alone, as she stays with one of her daughter's a couple days out of the week & her other daughter comes to the pt's home and stays the other days.    Prior Function               Mobility Comments: Family assists with mobility for safety; pt uses a RW for household ambulation (shorter distances recently)       Hand Dominance        Extremity/Trunk Assessment   Upper Extremity Assessment Upper Extremity Assessment: Defer to OT evaluation    Lower Extremity Assessment Lower Extremity Assessment: Generalized weakness       Communication   Communication: Expressive difficulties  Cognition Arousal/Alertness: Awake/alert Behavior During Therapy: WFL for tasks assessed/performed Overall Cognitive Status: History of cognitive impairments - at baseline                                 General Comments: pleasant, follows most one step commands with cues and incr time        General Comments      Exercises     Assessment/Plan    PT Assessment Patient needs continued PT services  PT Problem List Decreased strength;Decreased activity tolerance;Decreased balance;Decreased mobility;Decreased cognition       PT Treatment Interventions DME instruction;Therapeutic exercise;Gait training;Functional mobility training;Therapeutic activities;Patient/family education    PT Goals (Current goals can be found in the Care Plan section)  Acute Rehab PT Goals PT Goal Formulation: Patient unable to participate in goal setting Time For Goal Achievement: 08/11/22 Potential to Achieve Goals: Good    Frequency Min 3X/week     Co-evaluation               AM-PAC PT "6 Clicks" Mobility  Outcome Measure Help  needed turning from your back to your side while in a flat bed without using bedrails?: A Little Help needed moving from lying on your back to sitting on the side of a flat bed without using bedrails?: A Lot Help needed moving to and from a bed to a chair (including a wheelchair)?: A Little Help needed standing up from a chair using your arms (e.g., wheelchair or bedside chair)?: A Little Help needed to walk in hospital room?: A Little Help needed climbing 3-5 steps with a railing? : A Lot 6 Click Score: 16    End of Session Equipment Utilized During Treatment: Gait belt Activity Tolerance: Patient tolerated treatment well Patient left: in bed;with call bell/phone within reach;with bed alarm set   PT Visit Diagnosis: Other abnormalities of gait and mobility (R26.89);Difficulty in walking, not elsewhere classified (R26.2)    Time: 5329-9242 PT Time Calculation (min) (ACUTE ONLY): 8 min   Charges:   PT Evaluation $PT Eval Low Complexity: [place], PT  Acute Rehab Dept [facility]) [phone]  [facility] Weekend Pager [facility] only)  [phone]  07/28/2022   [facility] 07/28/2022, 4:19 PM

## 2022-07-28 NOTE — Evaluation (Signed)
Clinical/Bedside Swallow Evaluation Patient Details  Name: [name] [name] MRN: [id number] Date of Birth: 10-16-1933  Today's Date: 07/28/2022 Time: SLP Start Time (ACUTE ONLY): 1227 SLP Stop Time (ACUTE ONLY): 5284 SLP Time Calculation (min) (ACUTE ONLY): 11 min  Past Medical History:  Past Medical History:  Diagnosis Date   Arthritis    "knees, legs" (06/10/2016)   Ascites    Chronic kidney disease    "related to my diabetes"   Chronic lower back pain    Diabetes mellitus without complication (HCC)    Eczema    GERD (gastroesophageal reflux disease)    High cholesterol    Hyperlipidemia    Hypertension    Hypothyroidism    OAB (overactive bladder)    Osteoporosis    Thyroid disease    Type II diabetes mellitus ([place])    Urticaria    Past Surgical History:  Past Surgical History:  Procedure Laterality Date   ABDOMINAL HYSTERECTOMY     KNEE ARTHROSCOPY     LAPAROTOMY N/A 12/03/2016   Procedure: EXPLORATORY LAPAROTOMY, LYSIS OF ADHESIONS;  Surgeon: Armandina Gemma, MD;  Location: [facility];  Service: General;  Laterality: N/A;   LOOP RECORDER INSERTION N/A 08/23/2019   Procedure: LOOP RECORDER INSERTION;  Surgeon: Evans Lance, MD;  Location: [facility];  Service: Cardiovascular;  Laterality: N/A;   vocal cord polyps     HPI:  [name] [name] is a 89 y.o. female with medical history significant for dementia, dysphagia, T2DM, HTN, HLD, hypothyroidism, normocytic anemia, history of seizure-like activity on Keppra, dermatomyositis on chronic prednisone and methotrexate, dysphagia, history of syncope due to autonomic dysfunction and vasovagal episodes who presented to the ED for evaluation of altered mental status. hest CT showed multifocal pulmonary infiltrates potentially from aspiration events. Per chart, patient has been having issues with dysphagia and choking sensation when eating meals, family has been concerned about aspiration and was seen by SLP at outpatient facility.  Severl hours later she was noted to have cough with thick mucus output and concern for aspiration. Family reports that she has been having progressive functional decline over the last month or so. MBS 04/2022 with silent aspiration rec'ing nectar thick, regular (family ordered softer foods).    Assessment / Plan / Recommendation  Clinical Impression  Pt exhibited signs of a pharyngeal dysphagia in setting of having history of aspiration (sensed and silent) during MBS 04/2022. Today she is drowsy but able to wake adequately with significant lingual candidas, upper and lower dentures. Immediate cough with water indicative of possible altered airway protection. Trial applesauce swallow timely in succession without cough, throat clear. Pt has pna currently and would benefit from MBS (awaiting to hear from [facility] for today versus 1/10). Recommend she continue NPO status but may have bites applesauce with crushed meds after oral care. MBS likely be 1/10. Palliative care MD stated daughter, Edwin Cap would like to be present if possible and will pass this on to next SLP.  SLP Visit Diagnosis: Dysphagia, unspecified (R13.10)    Aspiration Risk  Moderate aspiration risk    Diet Recommendation NPO except meds (crush)   Medication Administration: Crushed with puree Supervision: Full supervision/cueing for compensatory strategies;Staff to assist with self feeding Compensations: Slow rate;Small sips/bites Postural Changes: Seated upright at 90 degrees    Other  Recommendations Oral Care Recommendations: Oral care BID    Recommendations for follow up therapy are one component of a multi-disciplinary discharge planning process, led by the attending physician.  Recommendations may be updated based on patient status, additional functional criteria and insurance authorization.  Follow up Recommendations  (TBD)      Assistance Recommended at Discharge    Functional Status Assessment Patient has had a recent decline  in their functional status and demonstrates the ability to make significant improvements in function in a reasonable and predictable amount of time.  Frequency and Duration min 2x/week  2 weeks       Prognosis Prognosis for Safe Diet Advancement: Good Barriers to Reach Goals: Cognitive deficits      Swallow Study   General Date of Onset: 07/27/22 HPI: [name] [name] is a 89 y.o. female with medical history significant for dementia, dysphagia, T2DM, HTN, HLD, hypothyroidism, normocytic anemia, history of seizure-like activity on Keppra, dermatomyositis on chronic prednisone and methotrexate, dysphagia, history of syncope due to autonomic dysfunction and vasovagal episodes who presented to the ED for evaluation of altered mental status. hest CT showed multifocal pulmonary infiltrates potentially from aspiration events. Per chart, patient has been having issues with dysphagia and choking sensation when eating meals, family has been concerned about aspiration and was seen by SLP at outpatient facility. Severl hours later she was noted to have cough with thick mucus output and concern for aspiration. Family reports that she has been having progressive functional decline over the last month or so. MBS 04/2022 with silent aspiration rec'ing nectar thick, regular (family ordered softer foods). Type of Study: Bedside Swallow Evaluation Previous Swallow Assessment:  (see HPI) Diet Prior to this Study: NPO Temperature Spikes Noted: No Respiratory Status: Room air History of Recent Intubation: No Behavior/Cognition: Other (Comment) (awake, little drowsy) Oral Cavity Assessment: Other (comment);Dry (lingual candidias) Oral Care Completed by SLP: Yes Oral Cavity - Dentition: Dentures, bottom;Dentures, top Vision: Functional for self-feeding Self-Feeding Abilities: Needs assist Patient Positioning: Upright in bed Baseline Vocal Quality: Normal Volitional Cough: Cognitively unable to elicit Volitional  Swallow: Unable to elicit    Oral/Motor/Sensory Function Overall Oral Motor/Sensory Function: Generalized oral weakness   Ice Chips Ice chips: Not tested   Thin Liquid Thin Liquid: Impaired Presentation: Cup;Spoon Pharyngeal  Phase Impairments: Cough - Immediate    Nectar Thick Nectar Thick Liquid: Not tested   Honey Thick Honey Thick Liquid: Not tested   Puree Puree: Within functional limits   Solid     Solid: Not tested      Houston Siren 07/28/2022,1:06 PM

## 2022-07-29 ENCOUNTER — Inpatient Hospital Stay (HOSPITAL_COMMUNITY): Payer: Medicare HMO

## 2022-07-29 DIAGNOSIS — Z515 Encounter for palliative care: Secondary | ICD-10-CM | POA: Diagnosis not present

## 2022-07-29 DIAGNOSIS — Z7189 Other specified counseling: Secondary | ICD-10-CM | POA: Diagnosis not present

## 2022-07-29 DIAGNOSIS — J690 Pneumonitis due to inhalation of food and vomit: Secondary | ICD-10-CM | POA: Diagnosis not present

## 2022-07-29 LAB — GLUCOSE, CAPILLARY
Glucose-Capillary: 229 mg/dL — ABNORMAL HIGH (ref 70–99)
Glucose-Capillary: 319 mg/dL — ABNORMAL HIGH (ref 70–99)
Glucose-Capillary: 226 mg/dL — ABNORMAL HIGH (ref 70–99)
Glucose-Capillary: 112 mg/dL — ABNORMAL HIGH (ref 70–99)
Glucose-Capillary: 336 mg/dL — ABNORMAL HIGH (ref 70–99)

## 2022-07-29 MED ORDER — HALOPERIDOL LACTATE 5 MG/ML IJ SOLN
1.0000 mg | Freq: Once | INTRAMUSCULAR | Status: AC
  Administered 2022-07-30: 1 mg via INTRAVENOUS
  Filled 2022-07-29: qty 0.2

## 2022-07-29 NOTE — Progress Notes (Signed)
Modified Barium Swallow Progress Note  Patient Details  Name: [name] [name] MRN: [id number] Date of Birth: 1934-02-22  Today's Date: 07/29/2022  Modified Barium Swallow completed.  Full report located under Chart Review in the Imaging Section.  Brief recommendations include the following:  Clinical Impression  Patient presents with moderately severe sensorimotor pharyngo-cervical esophageal dysphagia - Marginally worsened compared to [facility] 03/2022.  Dysphagia presumed secondary to polymyositis - resulting in poor pharyngeal motility with compromised UES opening causing gross retention without sensation nor ability to clear fully.  Retention was worse with increased viscosity- and was severe with pudding and cracker boluses.  Aspiration of thin and nectar noted after swallow due to retention spilling into open airway.  Various postures including chin tuck, head turn did not improve clearance.  HOB reclined to 45* allowed reservoir at pyriform sinus to retain barium mixed with secretions. Cued swallow was only conducted 10% of opportunities and was only marginally effective.  Cued cough and/or throat clear prompted reflexive swallow approx. 20% of the time. Patient did not "expectorate" on demand - to help clear vallecular retention.   Suspect pt's dermamyositis and deconditioning is causing exacerbation of baseline dysphagia and is a significant source of her progressive weight loss, dehydration and aspiration pneumonia.  Prognosis for swallow to return to functional level is guarded to poor in this SLP's opinion.  Educated pt's daughter extensively to findings and recommendations reviewing prior MBS flouro loops.   Using video monitor during testing for immediate feedback, pt educated.  SLP does not recommend any type of feeding tube for this pt as it will not change her outcomes.  Reasonable option is consideration for "comfort po" with precautions.  SLP reviewed MBS and concept of "comfort feeding" with  pt's daughter, [name] [name].  If she determine comfort po desired, recommend dys3/thin - if po diet planned with aggressive treatment - liquid diet would be tolerated best due to improved pharyngeal clearance.   Swallow Evaluation Recommendations       SLP Diet Recommendations: Other (Comment) (comfort po consideration advised)    Dys3/thin - COMFORT  Stop intake if pt coughing    Medication Administration: Via alternative means (or crushed with liquid - ? nectar thick?)       Compensations: Slow rate;Small sips/bites   Postural Changes: Other (Comment);Remain semi-upright after after feeds/meals (Comment)   Oral Care Recommendations: Oral care BID       Kathleen Lime, MS [facility] SLP Acute Rehab Services Office [phone] Pager [phone]  Macario Golds 07/29/2022,3:24 PM

## 2022-07-29 NOTE — Progress Notes (Signed)
Daily Progress Note   Patient Name: [name] [name]       Date: 07/29/2022 DOB: 29-Mar-1934  Age: 89 y.o. MRN#: [id number] Attending Physician: Shelly Coss, MD Primary Care Physician: Lajean Manes, MD Admit Date: 07/27/2022  Reason for Consultation/Follow-up: Establishing goals of care  Subjective: Back from MBS study  Length of Stay: 2  Current Medications: Scheduled Meds:   heparin  5,000 Units Subcutaneous Q8H   insulin aspart  0-9 Units Subcutaneous Q4H   levETIRAcetam  500 mg Oral BID   levothyroxine  200 mcg Oral Q0600   nystatin  5 mL Oral QID   predniSONE  5 mg Oral Q breakfast    Continuous Infusions:  sodium chloride 75 mL/hr at 07/29/22 1407   ampicillin-sulbactam (UNASYN) IV 1.5 g (07/29/22 0925)    PRN Meds: acetaminophen **OR** acetaminophen, hydrALAZINE, loratadine, ondansetron **OR** ondansetron (ZOFRAN) IV, senna-docusate, triamcinolone ointment  Physical Exam         pleasantly confused, very deconditioned  Diminished bases S 1 S 2 Abdomen not distended No edema  Vital Signs: BP (!) 147/66 (BP Location: Right Arm)   Pulse 80   Temp 97.8 F (36.6 C) (Oral)   Resp 16   Ht '5\' 6"'$  (1.676 m)   Wt 49.9 kg   SpO2 100%   BMI 17.76 kg/m  SpO2: SpO2: 100 % O2 Device: O2 Device: Room Air O2 Flow Rate:    Intake/output summary:  Intake/Output Summary (Last 24 hours) at 07/29/2022 1444 Last data filed at 07/29/2022 1407 Gross per 24 hour  Intake 2013.88 ml  Output --  Net 2013.88 ml   LBM: Last BM Date : 07/28/22 Baseline Weight: Weight: 49.9 kg Most recent weight: Weight: 49.9 kg       Palliative Assessment/Data:      Patient Active Problem List   Diagnosis Date Noted   Normocytic anemia 07/27/2022   Dementia without behavioral  disturbance ([place]) 07/27/2022   Malnutrition of moderate degree 05/11/2022   Near syncope 05/09/2022   Aspiration pneumonia ([place]) 01/19/2022   Fever 01/16/2022   Syncope 01/13/2022   Fever, unknown origin 01/13/2022   Elevated troponin 01/13/2022   COVID-19 virus infection 09/14/2021   Syncope and collapse 09/13/2021   Dermatopolymyositis, unspecified, organ involvement unspecified ([place]) 09/13/2021   Poor appetite 09/13/2021   Hyponatremia 09/13/2021  Uncontrolled diabetes mellitus with hypoglycemia without coma, with long-term current use of insulin ([place]) 09/13/2021   Sacral ulcer ([place]) 09/13/2021   Allergic conjunctivitis of both eyes 03/15/2020   PAD (peripheral artery disease) ([place]) 10/24/2019   Malnutrition ([place]) 10/10/2019   History of seizure 27/25/3664   Acute metabolic encephalopathy 40/34/7425   Right shoulder pain 08/21/2019   Leukocytosis 08/21/2019   Acute encephalopathy 08/18/2019   Enterocolitis 08/18/2019   Hyperglycemia 08/18/2019   Chronic kidney disease    Pressure injury of skin 12/09/2016   SBO (small bowel obstruction) s/p lysis of adhesions 12/03/2016 12/04/2016   Acute urinary retention 11/27/2016   Ascites 11/27/2016   Abdominal distension    Facial droop    Hyperkalemia 11/25/2016   Sepsis ([place]) 11/25/2016   Acute renal failure superimposed on stage 2 chronic kidney disease ([place]) 11/25/2016   Delirium 11/25/2016   Age-related osteoporosis without current pathological fracture 10/01/2016   Hyperlipidemia 06/10/2016   GERD (gastroesophageal reflux disease) 06/10/2016   Insomnia 06/10/2016   Viral gastroenteritis    Right hip pain 05/06/2015   Vaginal atrophy 11/01/2013   ECZEMA 07/24/2008   INTERMITTENT VERTIGO 07/24/2008   Constipation 11/16/2007   INSOMNIA 08/12/2007   Oral thrush 08/03/2007   Hypertension associated with diabetes ([place]) 02/14/2007   Allergic rhinitis due to allergen 02/14/2007   Hypothyroidism, acquired 01/26/2007   Insulin  dependent type 2 diabetes mellitus ([place]) 01/26/2007   GERD 01/26/2007   DEGENERATIVE JOINT DISEASE 01/26/2007    Palliative Care Assessment & Plan   Patient Profile:    Assessment: Aspiration pneumonia CT showing multifocal pulmonary infiltrates likely aspiration pneumonia Acute metabolic encephalopathy has underlying dementia Acute kidney injury on top of stage II chronic kidney disease History of tremor total myositis History of seizures hypertension and diabetes   Recommendations/Plan: Ongoing goals of care discussions with both the patient's daughters.  Modified barium swallow was done.  Discussed with SLP colleague about findings in relation to today's modified barium swallow study.  Overall recommendation from SLP perspective is at that the patient is at high risk for ongoing aspiration, is at high risk for ongoing dysphagia and development of aspiration pneumonias, as such, recommendation is made for consideration for comfort measures only.  Call placed and I was able to reach both daughters in a conference call.  We reviewed about the patient's underlying irreversible condition, we reviewed to the best of my ability about what CT scan of the chest is showing with regards to multifocal pulmonary infiltrates and recurrent aspiration pneumonia.  Discussed about concept of comfort feeds.  Discussed about her lack of evidence for any benefit in artificial feeding in the setting of advanced dementia.  Daughters are not in favor of PEG tube, daughters are not in favor of temporary feeding tube and are in agreement with the concept of comfort feedings.  Introduced hospice philosophy of care. Plan: Comfort feeds Home with hospice.  Will request TOC support.  Goals of Care and Additional Recommendations: Limitations on Scope of Treatment: No Artificial Feeding  Code Status:    Code Status Orders  (From admission, onward)           Start     Ordered   07/27/22 2316  Do not attempt  resuscitation (DNR)  Continuous       Question Answer Comment  If patient has no pulse and is not breathing Do Not Attempt Resuscitation   If patient has a pulse and/or is breathing: Medical Treatment Goals LIMITED ADDITIONAL INTERVENTIONS:  Use medication/IV fluids and cardiac monitoring as indicated; Do not use intubation or mechanical ventilation (DNI), also provide comfort medications.  Transfer to Progressive/Stepdown as indicated, avoid Intensive Care.   Consent: Discussion documented in EHR or advanced directives reviewed      07/27/22 2317           Code Status History     Date Active Date Inactive Code Status Order ID Comments User Context   05/09/2022 2153 05/13/2022 2041 Full Code [id number]  Kayleen Memos, DO ED   01/13/2022 1556 01/20/2022 1935 DNR [id number]  Jonnie Finner, DO Inpatient   09/13/2021 1921 09/16/2021 1758 DNR [id number]  Lavina Hamman, MD ED   09/13/2021 1901 09/13/2021 1921 Full Code [id number]  Lavina Hamman, MD ED   10/08/2019 1040 10/12/2019 1639 Full Code [id number]  Vonzella Nipple, NP ED   08/21/2019 1118 08/23/2019 2332 Full Code [id number]  Norval Morton, MD ED   08/18/2019 0602 08/20/2019 1748 Full Code [id number]  Shela Leff, MD Inpatient   11/26/2016 0048 12/09/2016 1943 Full Code [id number]  Edwin Dada, MD Inpatient   06/10/2016 1355 06/11/2016 1537 Full Code [id number]  Rondel Jumbo, PA-C ED      Advance Directive Documentation    Flowsheet Row Most Recent Value  Type of Advance Directive Out of facility DNR (pink MOST or yellow form)  Pre-existing out of facility DNR order (yellow form or pink MOST form) --  "MOST" Form in Place? --       Prognosis:  < 6 months  Discharge Planning: Home with Hospice  Care plan was discussed with daughters on phone in family meeting  Thank you for allowing the Palliative Medicine Team to assist in the care of this patient.  High MDM     Greater than 50%  of this time was spent  counseling and coordinating care related to the above assessment and plan.  Loistine Chance, MD  Please contact Palliative Medicine Team phone at [phone] for questions and concerns.

## 2022-07-29 NOTE — Progress Notes (Signed)
PROGRESS NOTE  [name] [name]  ZDG:[id number] DOB: October 27, 1933 DOA: 07/27/2022 PCP: Lajean Manes, MD   Brief Narrative: Patient is 89 year old female with history of dementia, type 2 diabetes, hypertension, hyperlipidemia, hypothyroidism, normocytic anemia ,seizure disorder on Keppra, dermatomyositis on chronic prednisone/methotrexate, dysphagia history of syncope due to autonomic dysfunction, vasovagal episodes who presented to the emergency department for the evaluation of altered mental status, brought by her daughter.  Having issues with dysphagia, choking sensation while eating meals, family concerned about aspiration.  She has progressive decline over the last month or so.  On presentation, she was mildly hypertensive, saturating fine on room air, lab work showed WBC count of 11.1.  COVID, flu negative.  Chest x-ray showed hyperinflated lung field, no acute cardiopulmonary disease.  CT head negative for any acute intracranial findings.  CT chest showed multifocal peribronchial groundglass pulmonary infiltrates.  Patient was started on Unasyn for suspicion of aspiration pneumonia.Speech consulted.  Plan for MBS, currently recommending NPO.  Assessment & Plan:  Principal Problem:   Aspiration pneumonia ([place]) Active Problems:   Acute metabolic encephalopathy   Acute renal failure superimposed on stage 2 chronic kidney disease (HCC)   Hypothyroidism, acquired   Insulin dependent type 2 diabetes mellitus ([place])   Hypertension associated with diabetes ([place])   History of seizure   Dermatopolymyositis, unspecified, organ involvement unspecified ([place])   Normocytic anemia   Dementia without behavioral disturbance ([place])   Aspiration pneumonia: Presented with dysphagia, choking sensation.  History of chronic aspiration.  CTA showed multifocal pulmonary infiltrates likely aspiration pneumonia.  Started on Unasyn. Speech therapy evaluation requested.  Currently she remains on room air.  No dyspnea  noted on examination today.  Speech therapy saw the patient and recommended n.p.o., plan for MBS  Acute metabolic encephalopathy: Has dementia at baseline.  Increased confusion likely triggered by pneumonia.  Continue antibiotics.  Continue delirium precautions.  Currently her mentation looks at baseline  AKI in CKD stage II: Baseline creatinine is normal.  Elevated creatinine at 1.1 on presentation.  Started on gentle IV fluids. resolved  Normocytic anemia: Currently hemoglobin stable.  Dermatomyositis: Prednisone 5 mg daily.  On methotrexate every week.  History of seizure:On  Keppra  Hypertension: Blood pressure soft today, losartan will be held  Insulin-dependent diabetes type 2: Currently on sliding scale insulin.  Monitor blood sugars  Hypothyroidism: Continue Synthyroid  Goals of care: Elderly patient with multiple comorbidities.  Now with aspiration pneumonia.  High chance of aspiration in the future. We consulted Palliative  care.  Patient lives with the daughter, mostly nonambulatory, on bed/sits on the couch most of the time.  CODE STATUS is DnR. PT recommended home health upon discharge     Pressure Injury 07/28/22 Toe (Comment  which one) Anterior;Left Stage 1 -  Intact skin with non-blanchable redness of a localized area usually over a bony prominence. (Active)  07/28/22 0205  Location: Toe (Comment  which one)  Location Orientation: Anterior;Left  Staging: Stage 1 -  Intact skin with non-blanchable redness of a localized area usually over a bony prominence.  Wound Description (Comments):   Present on Admission: Yes  Dressing Type Compression wrap 07/29/22 5956    DVT prophylaxis:heparin injection 5,000 Units Start: 07/28/22 0600     Code Status: DNR  Family Communication:: Discussed with daughter on phone on 1/10  Patient status:Inpatient  Patient is from :Home  Anticipated discharge LO:[facility]  Estimated DC date: In 1 to 2 days   Consultants: Palliative  care  Procedures:  None  Antimicrobials:  Anti-infectives (From admission, onward)    Start     Dose/Rate Route Frequency Ordered Stop   07/28/22 1000  ampicillin-sulbactam (UNASYN) 1.5 g in sodium chloride 0.9 % 100 mL IVPB        1.5 g 200 mL/hr over 30 Minutes Intravenous Every 12 hours 07/27/22 2325     07/27/22 2230  Ampicillin-Sulbactam (UNASYN) 3 g in sodium chloride 0.9 % 100 mL IVPB        3 g 200 mL/hr over 30 Minutes Intravenous  Once 07/27/22 2215 07/27/22 2307       Subjective: Patient seen and examined at bedside today hemodynamically stable.  On room air.  Looks comfortable, has few episodes of cough when I saw her in the bedside.  Not in any Distress.  Communicates but not oriented to time.  Objective: Vitals:   07/28/22 1010 07/28/22 1413 07/28/22 2001 07/29/22 0445  BP: (!) 163/68 (!) 170/66 (!) 169/71 (!) 93/57  Pulse: 99 96 86 (!) 120  Resp: '15 16 18   '$ Temp: 98.4 F (36.9 C) 98.7 F (37.1 C) 98.7 F (37.1 C)   TempSrc:  Oral Oral   SpO2: 93% 100% 100%   Weight:      Height:        Intake/Output Summary (Last 24 hours) at 07/29/2022 1140 Last data filed at 07/29/2022 0818 Gross per 24 hour  Intake 1390.88 ml  Output 500 ml  Net 890.88 ml   Filed Weights   07/28/22 0245  Weight: 49.9 kg    Examination:  General exam: Overall comfortable, not in distress, pleasantly confused, very deconditioned HEENT: PERRL Respiratory system: Diminished sounds bilaterally, no wheezes or crackles  Cardiovascular system: S1 & S2 heard, RRR.  Gastrointestinal system: Abdomen is nondistended, soft and nontender. Central nervous system: Alert and awake, obeys commands, oriented to place Extremities: No edema, no clubbing ,no cyanosis Skin: No rashes, no ulcers,no icterus     Data Reviewed: I have personally reviewed following labs and imaging studies  CBC: Recent Labs  Lab 07/27/22 1750 07/28/22 0404  WBC 11.1* 8.8  NEUTROABS 9.8*  --   HGB 9.2* 9.7*   HCT 28.7* 29.3*  MCV 99.7 95.8  PLT 328 846   Basic Metabolic Panel: Recent Labs  Lab 07/27/22 1750 07/28/22 0404  NA 135 137  K 4.6 4.1  CL 96* 102  CO2 28 27  GLUCOSE 225* 85  BUN 37* 24*  CREATININE 1.11* 0.76  CALCIUM 9.8 9.6     Recent Results (from the past 240 hour(s))  Resp panel by RT-PCR (RSV, Flu A&B, Covid) Anterior Nasal Swab     Status: None   Collection Time: 07/27/22  5:35 PM   Specimen: Anterior Nasal Swab  Result Value Ref Range Status   SARS Coronavirus 2 by RT PCR NEGATIVE NEGATIVE Final    Comment: (NOTE) SARS-CoV-2 target nucleic acids are NOT DETECTED.  The SARS-CoV-2 RNA is generally detectable in upper respiratory specimens during the acute phase of infection. The lowest concentration of SARS-CoV-2 viral copies this assay can detect is 138 copies/mL. A negative result does not preclude SARS-Cov-2 infection and should not be used as the sole basis for treatment or other patient management decisions. A negative result may occur with  improper specimen collection/handling, submission of specimen other than nasopharyngeal swab, presence of viral mutation(s) within the areas targeted by this assay, and inadequate number of viral copies(<138 copies/mL). A negative result must be combined with clinical  observations, patient history, and epidemiological information. The expected result is Negative.  Fact Sheet for Patients:  EntrepreneurPulse.com.au  Fact Sheet for Healthcare Providers:  IncredibleEmployment.be  This test is no t yet approved or cleared by the [place] FDA and  has been authorized for detection and/or diagnosis of SARS-CoV-2 by FDA under an Emergency Use Authorization (EUA). This EUA will remain  in effect (meaning this test can be used) for the duration of the COVID-19 declaration under Section 564(b)(1) of the Act, 21 U.S.C.section 360bbb-3(b)(1), unless the authorization is terminated   or revoked sooner.       Influenza A by PCR NEGATIVE NEGATIVE Final   Influenza B by PCR NEGATIVE NEGATIVE Final    Comment: (NOTE) The Xpert Xpress SARS-CoV-2/FLU/RSV plus assay is intended as an aid in the diagnosis of influenza from Nasopharyngeal swab specimens and should not be used as a sole basis for treatment. Nasal washings and aspirates are unacceptable for Xpert Xpress SARS-CoV-2/FLU/RSV testing.  Fact Sheet for Patients: EntrepreneurPulse.com.au  Fact Sheet for Healthcare Providers: IncredibleEmployment.be  This test is not yet approved or cleared by the [place] FDA and has been authorized for detection and/or diagnosis of SARS-CoV-2 by FDA under an Emergency Use Authorization (EUA). This EUA will remain in effect (meaning this test can be used) for the duration of the COVID-19 declaration under Section 564(b)(1) of the Act, 21 U.S.C. section 360bbb-3(b)(1), unless the authorization is terminated or revoked.     Resp Syncytial Virus by PCR NEGATIVE NEGATIVE Final    Comment: (NOTE) Fact Sheet for Patients: EntrepreneurPulse.com.au  Fact Sheet for Healthcare Providers: IncredibleEmployment.be  This test is not yet approved or cleared by the [place] FDA and has been authorized for detection and/or diagnosis of SARS-CoV-2 by FDA under an Emergency Use Authorization (EUA). This EUA will remain in effect (meaning this test can be used) for the duration of the COVID-19 declaration under Section 564(b)(1) of the Act, 21 U.S.C. section 360bbb-3(b)(1), unless the authorization is terminated or revoked.  Performed at [facility], [place] [address], [place], [place] [address]   Blood culture (routine x 2)     Status: None (Preliminary result)   Collection Time: 07/27/22  5:45 PM   Specimen: BLOOD  Result Value Ref Range Status   Specimen Description   Final     BLOOD BLOOD LEFT FOREARM Performed at [facility] [address] [place], [place] [address]    Special Requests   Final    BOTTLES DRAWN AEROBIC AND ANAEROBIC Blood Culture results may not be optimal due to an inadequate volume of blood received in culture bottles Performed at [facility] [address] [place], [place] [address]    Culture   Final    NO GROWTH 2 DAYS Performed at [facility] [address], [place], [place] [address]    Report Status PENDING  Incomplete  Blood culture (routine x 2)     Status: None (Preliminary result)   Collection Time: 07/27/22  5:50 PM   Specimen: BLOOD  Result Value Ref Range Status   Specimen Description   Final    BLOOD BLOOD RIGHT FOREARM Performed at [facility] [address], [place], [place] [address]    Special Requests   Final    BOTTLES DRAWN AEROBIC AND ANAEROBIC Blood Culture results may not be optimal due to an inadequate volume of blood received in culture bottles Performed at [facility], [place] [address], [place], [place] [address]    Culture   Final    NO GROWTH 2 DAYS Performed at [facility] [address] [place], [place] [address]    Report Status PENDING  Incomplete     Radiology Studies: CT Angio Chest PE W and/or Wo Contrast  Result Date: 07/27/2022 CLINICAL DATA:  Pulmonary embolism (PE) suspected, high prob. Dyspnea, altered mental status EXAM: CT ANGIOGRAPHY CHEST WITH CONTRAST TECHNIQUE: Multidetector CT imaging of the chest was performed using the standard protocol during bolus administration of intravenous contrast. Multiplanar CT image reconstructions and MIPs were obtained to evaluate the vascular anatomy. RADIATION DOSE REDUCTION: This exam was performed according to the departmental dose-optimization program which includes automated exposure control, adjustment of the mA and/or kV according to patient size and/or  use of iterative reconstruction technique. CONTRAST:  62m OMNIPAQUE IOHEXOL 350 MG/ML SOLN COMPARISON:  None Available. FINDINGS: Cardiovascular: There is adequate opacification the pulmonary arterial tree. Eccentric filling defect and web within the left lower lobar segmental pulmonary arteries are in keeping with the residua of chronic pulmonary embolism. No acute pulmonary emboli are identified, however. Central pulmonary arteries are of normal caliber. Moderate multi-vessel coronary artery calcification. Global cardiac size within normal limits. No pericardial effusion. Mild atherosclerotic calcification within the thoracic aorta. No aortic aneurysm. Mediastinum/Nodes: No enlarged mediastinal, hilar, or axillary lymph nodes. Thyroid gland, trachea, and esophagus demonstrate no significant findings. Lungs/Pleura: The lungs are symmetrically well expanded. Multifocal peribronchial ground-glass pulmonary infiltrate is present, best appreciated within the right mid lung zone and left lower lobe most in keeping with changes of multifocal pneumonia in the appropriate clinical setting. No pneumothorax or pleural effusion. No central obstructing lesion. Upper Abdomen: No acute abnormality. Musculoskeletal: No acute bone abnormality. No lytic or blastic bone lesion. Implanted loop recorder seen within the a left anterior chest wall. Review of the MIP images confirms the above findings. IMPRESSION: 1. No acute pulmonary embolism. 2. Eccentric filling defect and web within the left lower lobar segmental pulmonary arteries in keeping with the residua of chronic pulmonary embolism. 3. Moderate multi-vessel coronary artery calcification. 4. Multifocal peribronchial ground-glass pulmonary infiltrate most in keeping with changes of multifocal pneumonia in the appropriate clinical setting. Aortic Atherosclerosis (ICD10-I70.0). Electronically Signed   By: AFidela SalisburyM.D.   On: 07/27/2022 22:03   CT HEAD WO CONTRAST  (5MM)  Result Date: 07/27/2022 CLINICAL DATA:  Mental status change of unknown cause. EXAM: CT HEAD WITHOUT CONTRAST TECHNIQUE: Contiguous axial images were obtained from the base of the skull through the vertex without intravenous contrast. RADIATION DOSE REDUCTION: This exam was performed according to the departmental dose-optimization program which includes automated exposure control, adjustment of the mA and/or kV according to patient size and/or use of iterative reconstruction technique. COMPARISON:  05/09/2022. FINDINGS: Brain: No evidence of acute infarction, hemorrhage, hydrocephalus, extra-axial collection or mass lesion/mass effect. Vascular: No hyperdense vessel or unexpected calcification. Skull: Normal. Negative for fracture or focal lesion. Sinuses/Orbits: Globes and orbits are unremarkable. Visualized sinuses are clear. Other: None. IMPRESSION: 1. No acute intracranial abnormalities. No change from the prior study. Electronically Signed   By: DLajean ManesM.D.   On: 07/27/2022 21:11   DG Chest Port 1 View  Result Date: 07/27/2022 CLINICAL DATA:  Altered mental status. EXAM: PORTABLE CHEST 1 VIEW COMPARISON:  X-ray 05/09/2022 and older FINDINGS: Hyperinflation. No consolidation, pneumothorax or effusion. No edema. Normal cardiopericardial silhouette. Calcified aorta. Overlapping cardiac leads. Osteopenia. Loop  recorder overlying the lower left thorax. IMPRESSION: Hyperinflation.  No acute cardiopulmonary disease. Electronically Signed   By: Jill Side M.D.   On: 07/27/2022 18:18    Scheduled Meds:  heparin  5,000 Units Subcutaneous Q8H   insulin aspart  0-9 Units Subcutaneous Q4H   levETIRAcetam  500 mg Oral BID   levothyroxine  200 mcg Oral Q0600   nystatin  5 mL Oral QID   predniSONE  5 mg Oral Q breakfast   Continuous Infusions:  sodium chloride 75 mL/hr at 07/29/22 0548   ampicillin-sulbactam (UNASYN) IV 1.5 g (07/29/22 0925)     LOS: 2 days   Shelly Coss, MD [facility] P1/04/2023, 11:40 AM

## 2022-07-29 NOTE — Inpatient Diabetes Management (Signed)
Inpatient Diabetes Program Recommendations  AACE/ADA: New Consensus Statement on Inpatient Glycemic Control (2015)  Target Ranges:  Prepandial:   less than 140 mg/dL      Peak postprandial:   less than 180 mg/dL (1-2 hours)      Critically ill patients:  140 - 180 mg/dL   Lab Results  Component Value Date   GLUCAP 226 (H) 07/29/2022   HGBA1C 8.9 (H) 05/10/2022    Review of Glycemic Control  Diabetes history: DM2 Outpatient Diabetes medications: Levemir 7 in am, 3-4 units QHS, Novolog 2-10 TID Current orders for Inpatient glycemic control: Novolog 0-9 Q4H  Inpatient Diabetes Program Recommendations:    If appropriate, consider Semglee 5 units QHS  Thank you. Lorenda Peck, RD, LDN, [place] Inpatient Diabetes Coordinator [phone]

## 2022-07-30 DIAGNOSIS — Z515 Encounter for palliative care: Secondary | ICD-10-CM | POA: Diagnosis not present

## 2022-07-30 DIAGNOSIS — R531 Weakness: Secondary | ICD-10-CM

## 2022-07-30 DIAGNOSIS — Z7189 Other specified counseling: Secondary | ICD-10-CM | POA: Diagnosis not present

## 2022-07-30 DIAGNOSIS — J690 Pneumonitis due to inhalation of food and vomit: Secondary | ICD-10-CM | POA: Diagnosis not present

## 2022-07-30 LAB — CBC
HCT: 27.4 % — ABNORMAL LOW (ref 36.0–46.0)
Hemoglobin: 8.8 g/dL — ABNORMAL LOW (ref 12.0–15.0)
MCH: 31.7 pg (ref 26.0–34.0)
MCHC: 32.1 g/dL (ref 30.0–36.0)
MCV: 98.6 fL (ref 80.0–100.0)
Platelets: 321 10*3/uL (ref 150–400)
RBC: 2.78 MIL/uL — ABNORMAL LOW (ref 3.87–5.11)
RDW: 15.1 % (ref 11.5–15.5)
WBC: 9.9 10*3/uL (ref 4.0–10.5)
nRBC: 0 % (ref 0.0–0.2)

## 2022-07-30 LAB — GLUCOSE, CAPILLARY
Glucose-Capillary: 378 mg/dL — ABNORMAL HIGH (ref 70–99)
Glucose-Capillary: 218 mg/dL — ABNORMAL HIGH (ref 70–99)
Glucose-Capillary: 202 mg/dL — ABNORMAL HIGH (ref 70–99)

## 2022-07-30 LAB — BASIC METABOLIC PANEL
Anion gap: 9 (ref 5–15)
BUN: 17 mg/dL (ref 8–23)
CO2: 24 mmol/L (ref 22–32)
Calcium: 9.3 mg/dL (ref 8.9–10.3)
Chloride: 103 mmol/L (ref 98–111)
Creatinine, Ser: 0.73 mg/dL (ref 0.44–1.00)
GFR, Estimated: >60 mL/min (ref >60)
Glucose, Bld: 235 mg/dL — ABNORMAL HIGH (ref 70–99)
Potassium: 3.4 mmol/L — ABNORMAL LOW (ref 3.5–5.1)
Sodium: 136 mmol/L (ref 135–145)

## 2022-07-30 MED ORDER — POTASSIUM CHLORIDE 20 MEQ PO PACK
40.0000 meq | PACK | Freq: Once | ORAL | Status: AC
  Administered 2022-07-30: 40 meq via ORAL
  Filled 2022-07-30: qty 2

## 2022-07-30 MED ORDER — POTASSIUM CHLORIDE 20 MEQ PO PACK: 40.0000 meq | PACK | Freq: Every day | ORAL | 0 refills | Status: DC

## 2022-07-30 MED ORDER — INSULIN GLARGINE-YFGN 100 UNIT/ML ~~LOC~~ SOLN
5.0000 [IU] | Freq: Every day | SUBCUTANEOUS | Status: DC
  Administered 2022-07-30: 5 [IU] via SUBCUTANEOUS
  Filled 2022-07-30: qty 0.05

## 2022-07-30 MED ORDER — AMOXICILLIN-POT CLAVULANATE 875-125 MG PO TABS: 1.0000 | ORAL_TABLET | Freq: Two times a day (BID) | ORAL | 0 refills | Status: AC

## 2022-07-30 NOTE — Progress Notes (Addendum)
       CROSS COVER NOTE  NAME: [name] [name] MRN: [id number] DOB : 1933/10/25    Date of Service   07/30/2022   HPI/Events of Note   2330-  RN reports patient is agitated, restless, confused, and unable to follow commands.    She has tried multiple times to get out of bed.  High fall risk.  Sitter is not available at this time.  Patient is not redirectable, therefore not a candidate for TeleSitter.  Safety mitts ineffective.    Trial 1 mg Haldol now for increased agitation.   Interventions/ Plan   IV Haldol       Raenette Rover, DNP, [place]

## 2022-07-30 NOTE — Discharge Summary (Signed)
Physician Discharge Summary  [name] [name] MGN:[id number] DOB: [birth year] DOA: 07/27/2022  PCP: Lajean Manes, MD  Admit date: 07/27/2022 Discharge date: 07/30/2022  Admitted From: Home Disposition:  Home  Discharge Condition:Stable CODE STATUS:FULL, DNR, Comfort Care Diet recommendation: Heart Healthy / Carb Modified / Regular / Dysphagia   Brief/Interim Summary: Patient is 89 year old female with history of dementia, type 2 diabetes, hypertension, hyperlipidemia, hypothyroidism, normocytic anemia ,seizure disorder on Keppra, dermatomyositis on chronic prednisone/methotrexate, dysphagia history of syncope due to autonomic dysfunction, vasovagal episodes who presented to the emergency department for the evaluation of altered mental status, brought by her daughter.  Having issues with dysphagia, choking sensation while eating meals, family concerned about aspiration.  She has progressive decline over the last month or so.  On presentation, she was mildly hypertensive, saturating fine on room air, lab work showed WBC count of 11.1.  COVID, flu negative.  Chest x-ray showed hyperinflated lung field, no acute cardiopulmonary disease.  CT head negative for any acute intracranial findings.  CT chest showed multifocal peribronchial groundglass pulmonary infiltrates.  Patient was started on Unasyn for suspicion of aspiration pneumonia.Speech consulted.  Recommended dysphagia 3 diet.  Palliative care consulted for goals of care, recommend home with hospice.  Medically stable for discharge home today.  Following problems were addressed during the hospitalization:  Aspiration pneumonia: Presented with dysphagia, choking sensation.  History of chronic aspiration.  CTA showed multifocal pulmonary infiltrates likely aspiration pneumonia.  Started on Unasyn. Speech therapy evaluation requested.  Currently she remains on room air.  No dyspnea.  Speech therapy saw the patient and recommended dysphagia 3 diet after  MBS   Acute metabolic encephalopathy: Has dementia at baseline.   Currently her mentation looks at baseline   AKI in CKD stage II: Baseline creatinine is normal.  Elevated creatinine at 1.1 on presentation.  Started on gentle IV fluids. resolved   Normocytic anemia: Currently hemoglobin stable.   Dermatomyositis: Prednisone 5 mg daily.  On methotrexate every week.   History of seizure:On  Keppra   Hypertension: Blood pressure fluctuating during this hospitalization with hypertension/hypotension.  She takes losartan, amlodipine, midodrine at home.  Will discontinue losartan on discharge   Insulin-dependent diabetes type 2: Resume home insulin regimen   Hypothyroidism: Continue Synthyroid   Goals of care: Elderly patient with multiple comorbidities.  Now with aspiration pneumonia.  High chance of aspiration in the future. We consulted Palliative  care.  Patient lives with the daughter, mostly nonambulatory, on bed/sits on the couch most of the time.  CODE STATUS is DnR.  After discussion with palliative care, family agreeable for hospice at home.    Discharge Diagnoses:  Principal Problem:   Aspiration pneumonia ([place]) Active Problems:   Acute metabolic encephalopathy   Acute renal failure superimposed on stage 2 chronic kidney disease (HCC)   Hypothyroidism, acquired   Insulin dependent type 2 diabetes mellitus ([place])   Hypertension associated with diabetes ([place])   History of seizure   Dermatopolymyositis, unspecified, organ involvement unspecified ([place])   Normocytic anemia   Dementia without behavioral disturbance ([place])   Palliative care by specialist   Goals of care, counseling/discussion    Discharge Instructions  Discharge Instructions     Diet general   Complete by: As directed    Dysphagia 3 diet   Discharge instructions   Complete by: As directed    1)Please take prescribed medications as instructed 2)Follow with your PCP and Hospice services at home   Increase  activity slowly  Complete by: As directed    No wound care   Complete by: As directed       Allergies as of 07/30/2022       Reactions   Nsaids Other (See Comments)   No NSAIDS due to kidney function- ESPECIALLY ibuprofen or Aleve   Biaxin [clarithromycin] Hives   Bactrim [sulfamethoxazole-trimethoprim] Hives, Rash   Lisinopril Cough   Sulfa Antibiotics Hives, Rash, Other (See Comments)   NO sulfa-based meds!!        Medication List     STOP taking these medications    losartan 50 MG tablet Commonly known as: COZAAR       TAKE these medications    acetaminophen 650 MG CR tablet Commonly known as: TYLENOL Take 1,300 mg by mouth every 12 (twelve) hours.   amoxicillin-clavulanate 875-125 MG tablet Commonly known as: AUGMENTIN Take 1 tablet by mouth 2 (two) times daily for 3 days.   B-D UF III MINI PEN NEEDLES 31G X 5 MM Misc Generic drug: Insulin Pen Needle USE AS DIRECTED TID   Bayer Low Dose 81 MG tablet Generic drug: aspirin EC Take 81 mg by mouth daily. Swallow whole.   cetirizine 10 MG tablet Commonly known as: ZYRTEC Take 5 mg by mouth daily as needed (itching).   folic acid 1 MG tablet Commonly known as: FOLVITE Take 1 mg by mouth at bedtime.   Fusion Plus Caps Take 1 capsule by mouth daily with breakfast.   Levemir FlexTouch 100 UNIT/ML FlexPen Generic drug: insulin detemir Inject 3-7 Units into the skin See admin instructions. Inject 7 units into the skin in the morning and 3 units at bedtime if BGL is 200 or below and 4 units if BGL is greater than 200   levETIRAcetam 500 MG tablet Commonly known as: KEPPRA Take 1 tablet (500 mg total) by mouth 2 (two) times daily.   liver oil-zinc oxide 40 % ointment Commonly known as: DESITIN Apply topically 3 (three) times daily.   loperamide 2 MG tablet Commonly known as: IMODIUM A-D Take 2 mg by mouth 4 (four) times daily as needed for diarrhea or loose stools.   methotrexate 2.5 MG  tablet Commonly known as: RHEUMATREX Take 1 tablet (2.5 mg total) by mouth once a week. Caution:Chemotherapy. Protect from light. What changed:  how much to take when to take this   metoprolol tartrate 25 MG tablet Commonly known as: LOPRESSOR Take 1 tablet (25 mg total) by mouth 2 (two) times daily.   midodrine 5 MG tablet Commonly known as: PROAMATINE Take 1 tablet (5 mg total) by mouth 3 (three) times daily with meals. What changed: when to take this   NovoLOG FlexPen 100 UNIT/ML FlexPen Generic drug: insulin aspart Inject 2-10 Units into the skin See admin instructions. Inject 2-10 units into the skin three times a day with meals, per sliding scale:  Breakfast: BGL 80-199 = 8 units; 200-299 = 9 units; 300 or greater = 10 units Lunch: BGL 80-199 = 5 units; 200-299 = 6 units; 300 or greater = 7 units Supper/evening meal: BGL 80-199 = 2 units; 200-299 = 3 units; 300 or greater = 4 units   Omega 3 1000 MG Caps Take 1,000 mg by mouth daily.   OneTouch Delica Lancets 79G Misc IC-10 CODE:  E11.65  Check blood sugar   OneTouch Verio test strip Generic drug: glucose blood 1 each 3 (three) times daily.   PEPCID AC PO Take 1 tablet by mouth at bedtime.  polyethylene glycol 17 g packet Commonly known as: MIRALAX / GLYCOLAX Take 17 g by mouth daily. What changed:  when to take this reasons to take this   potassium chloride 20 MEQ packet Commonly known as: Klor-Con Take 40 mEq by mouth daily for 3 days.   predniSONE 5 MG tablet Commonly known as: DELTASONE Take 5 mg by mouth daily with breakfast.   Synthroid 200 MCG tablet Generic drug: levothyroxine Take 200 mcg by mouth daily before breakfast.   triamcinolone ointment 0.1 % Commonly known as: KENALOG Apply 1 Application topically 2 (two) times daily as needed (itching, rash).   VITAMIN D-3 PO Take 1,000 Units by mouth daily.        Follow-up Information     Stoneking, Hal, MD. Schedule an appointment as  soon as possible for a visit in 1 week(s).   Specialty: Internal Medicine Contact information: [address] [place] [address] [phone]                Allergies  Allergen Reactions   Nsaids Other (See Comments)    No NSAIDS due to kidney function- ESPECIALLY ibuprofen or Aleve   Biaxin [Clarithromycin] Hives   Bactrim [Sulfamethoxazole-Trimethoprim] Hives and Rash   Lisinopril Cough   Sulfa Antibiotics Hives, Rash and Other (See Comments)    NO sulfa-based meds!!     Consultations: Palliative care   Procedures/Studies: DG Swallowing Func-Speech Pathology  Result Date: 07/29/2022 Table formatting from the original result was not included. Objective Swallowing Evaluation: Type of Study: MBS-Modified Barium Swallow Study  Patient Details Name: [name] [name] MRN: [id number] Date of Birth: [birth year] Today's Date: 07/29/2022 Time: SLP Start Time (ACUTE ONLY): 18 -SLP Stop Time (ACUTE ONLY): 1200 SLP Time Calculation (min) (ACUTE ONLY): 37 min Past Medical History: Past Medical History: Diagnosis Date  Arthritis   "knees, legs" (06/10/2016)  Ascites   Chronic kidney disease   "related to my diabetes"  Chronic lower back pain   Diabetes mellitus without complication (HCC)   Eczema   GERD (gastroesophageal reflux disease)   High cholesterol   Hyperlipidemia   Hypertension   Hypothyroidism   OAB (overactive bladder)   Osteoporosis   Thyroid disease   Type II diabetes mellitus ([place])   Urticaria  Past Surgical History: Past Surgical History: Procedure Laterality Date  ABDOMINAL HYSTERECTOMY    KNEE ARTHROSCOPY    LAPAROTOMY N/A 12/03/2016  Procedure: EXPLORATORY LAPAROTOMY, LYSIS OF ADHESIONS;  Surgeon: Armandina Gemma, MD;  Location: [facility];  Service: General;  Laterality: N/A;  LOOP RECORDER INSERTION N/A 08/23/2019  Procedure: LOOP RECORDER INSERTION;  Surgeon: Evans Lance, MD;  Location: [facility];  Service: Cardiovascular;  Laterality: N/A;  vocal cord  polyps   HPI: [name] [name] is a 89 y.o. female with medical history significant for dementia, dysphagia, T2DM, HTN, HLD, hypothyroidism, normocytic anemia, history of seizure-like activity on Keppra, dermatomyositis on chronic prednisone and methotrexate, dysphagia, history of syncope due to autonomic dysfunction and vasovagal episodes who presented to the ED for evaluation of altered mental status. hest CT showed multifocal pulmonary infiltrates potentially from aspiration events. Per chart, patient has been having issues with dysphagia and choking sensation when eating meals, family has been concerned about aspiration and was seen by SLP at outpatient facility. Severl hours later she was noted to have cough with thick mucus output and concern for aspiration. Family reports that she has been having progressive functional decline over the last month or  so. MBS 04/2022 with silent aspiration rec'ing nectar thick, regular (family ordered softer foods).  Subjective: pt awake in swallow function chair  Recommendations for follow up therapy are one component of a multi-disciplinary discharge planning process, led by the attending physician.  Recommendations may be updated based on patient status, additional functional criteria and insurance authorization. Assessment / Plan / Recommendation   07/29/2022   2:24 PM Clinical Impressions Clinical Impression Patient presents with moderately severe sensorimotor pharyngo-cervical esophageal dysphagia - Marginally worsened compared to [facility] 03/2022.  Dysphagia presumed secondary to polymyositis - resulting in poor pharyngeal motility with compromised UES opening causing gross retention without sensation nor ability to clear fully. Retention was worse with increased viscosity- and was severe with pudding and cracker boluses.  Aspiration of thin and nectar noted after swallow due to retention spilling into open airway.  Various postures including chin tuck, head turn did not improve  clearance.  HOB reclined to 45* allowed reservoir at pyriform sinus to retain barium mixed with secretions. Cued swallow was only conducted 10% of opportunities and was only marginally effective.  Cued cough and/or throat clear prompted reflexive swallow approx. 20% of the time. Patient did not "expectorate" on demand - to help clear vallecular retention.   Suspect pt's dermamyositis and deconditioning is causing exacerbation of baseline dysphagia and is a significant source of her progressive weight loss, dehydration and aspiration pneumonia.  Prognosis for swallow to return to functional level is guarded to poor in this SLP's opinion.  Educated pt's daughter extensively to findings and recommendations reviewing prior MBS flouro loops.   Using video monitor during testing for immediate feedback, pt educated.  SLP does not recommend any type of feeding tube for this pt as it will not change her outcomes.  Reasonable option is consideration for "comfort po" with precautions.  SLP reviewed MBS and concept of "comfort feeding" with pt's daughter, [name].  If she determine comfort po desired, recommend dys3/thin - if po diet planned with aggressive treatment - liquid diet would be tolerated best due to improved pharyngeal clearance.  SLP Visit Diagnosis Dysphagia, pharyngoesophageal phase (R13.14) Impact on safety and function Risk for inadequate nutrition/hydration;Severe aspiration risk     07/29/2022   2:24 PM Treatment Recommendations Treatment Recommendations Therapy as outlined in treatment plan below     07/29/2022   3:21 PM Prognosis Prognosis for Safe Diet Advancement Good Barriers to Reach Goals Cognitive deficits   07/29/2022   2:24 PM Diet Recommendations SLP Diet Recommendations Other (Comment) Medication Administration Via alternative means Compensations Slow rate;Small sips/bites Postural Changes Other (Comment);Remain semi-upright after after feeds/meals (Comment)     07/29/2022   2:24 PM Other  Recommendations Oral Care Recommendations Oral care BID Follow Up Recommendations No SLP follow up Functional Status Assessment Patient has had a recent decline in their functional status and/or demonstrates limited ability to make significant improvements in function in a reasonable and predictable amount of time   07/29/2022   2:24 PM Frequency and Duration  Speech Therapy Frequency (ACUTE ONLY) min 2x/week Treatment Duration 2 weeks     07/29/2022   2:13 PM Oral Phase Oral Phase Impaired Oral - Nectar Teaspoon Reduced posterior propulsion;Weak lingual manipulation Oral - Nectar Cup Reduced posterior propulsion;Weak lingual manipulation Oral - Thin Teaspoon Reduced posterior propulsion;Weak lingual manipulation Oral - Thin Cup Reduced posterior propulsion;Weak lingual manipulation Oral - Thin Straw Reduced posterior propulsion;Weak lingual manipulation Oral - Puree Reduced posterior propulsion;Weak lingual manipulation Oral - Mech Soft Reduced posterior propulsion;Impaired  mastication;Weak lingual manipulation    07/29/2022   2:17 PM Pharyngeal Phase Pharyngeal Phase Impaired Pharyngeal- Nectar Cup Reduced pharyngeal peristalsis;Reduced epiglottic inversion;Reduced anterior laryngeal mobility;Reduced laryngeal elevation;Reduced tongue base retraction;Pharyngeal residue - valleculae;Pharyngeal residue - pyriform;Pharyngeal residue - posterior pharnyx;Penetration/Aspiration during swallow Pharyngeal Material enters airway, remains ABOVE vocal cords and not ejected out Pharyngeal- Thin Teaspoon Reduced pharyngeal peristalsis;Reduced epiglottic inversion;Reduced anterior laryngeal mobility;Reduced laryngeal elevation;Reduced airway/laryngeal closure;Reduced tongue base retraction;Pharyngeal residue - valleculae;Penetration/Aspiration during swallow;Pharyngeal residue - pyriform Pharyngeal Material enters airway, remains ABOVE vocal cords and not ejected out Pharyngeal- Thin Cup Reduced pharyngeal peristalsis;Reduced  epiglottic inversion;Penetration/Aspiration during swallow;Reduced anterior laryngeal mobility;Reduced laryngeal elevation;Reduced airway/laryngeal closure;Reduced tongue base retraction;Penetration/Apiration after swallow;Pharyngeal residue - valleculae;Pharyngeal residue - pyriform Pharyngeal Material enters airway, passes BELOW cords without attempt by patient to eject out (silent aspiration);Material enters airway, passes BELOW cords and not ejected out despite cough attempt by patient Pharyngeal- Thin Straw Reduced pharyngeal peristalsis;Reduced epiglottic inversion;Reduced anterior laryngeal mobility;Reduced laryngeal elevation;Reduced airway/laryngeal closure;Pharyngeal residue - valleculae;Pharyngeal residue - pyriform Pharyngeal Material enters airway, passes BELOW cords and not ejected out despite cough attempt by patient Pharyngeal- Puree Reduced anterior laryngeal mobility;Reduced laryngeal elevation;Reduced airway/laryngeal closure;Reduced tongue base retraction;Penetration/Aspiration before swallow;Delayed swallow initiation-vallecula;Pharyngeal residue - valleculae;Pharyngeal residue - pyriform Pharyngeal Material does not enter airway Pharyngeal- Mechanical Soft Reduced epiglottic inversion;Reduced anterior laryngeal mobility;Reduced laryngeal elevation;Reduced airway/laryngeal closure;Reduced tongue base retraction;Pharyngeal residue - valleculae;Pharyngeal residue - pyriform Pharyngeal Material does not enter airway    07/29/2022   2:23 PM Cervical Esophageal Phase  Cervical Esophageal Phase Impaired Cervical Esophageal Comment Impaired UES opening likely due to decreased traction on UES, impaired laryngeal elevation contributing to retention in pyriform sinus more than vallecular space.  Barium retention mixed with secretions. Kathleen Lime, MS [facility] SLP Acute Rehab Services Office [phone] Pager [phone] Macario Golds 07/29/2022, 3:26 PM                     CT Angio Chest PE W and/or Wo  Contrast  Result Date: 07/27/2022 CLINICAL DATA:  Pulmonary embolism (PE) suspected, high prob. Dyspnea, altered mental status EXAM: CT ANGIOGRAPHY CHEST WITH CONTRAST TECHNIQUE: Multidetector CT imaging of the chest was performed using the standard protocol during bolus administration of intravenous contrast. Multiplanar CT image reconstructions and MIPs were obtained to evaluate the vascular anatomy. RADIATION DOSE REDUCTION: This exam was performed according to the departmental dose-optimization program which includes automated exposure control, adjustment of the mA and/or kV according to patient size and/or use of iterative reconstruction technique. CONTRAST:  79m OMNIPAQUE IOHEXOL 350 MG/ML SOLN COMPARISON:  None Available. FINDINGS: Cardiovascular: There is adequate opacification the pulmonary arterial tree. Eccentric filling defect and web within the left lower lobar segmental pulmonary arteries are in keeping with the residua of chronic pulmonary embolism. No acute pulmonary emboli are identified, however. Central pulmonary arteries are of normal caliber. Moderate multi-vessel coronary artery calcification. Global cardiac size within normal limits. No pericardial effusion. Mild atherosclerotic calcification within the thoracic aorta. No aortic aneurysm. Mediastinum/Nodes: No enlarged mediastinal, hilar, or axillary lymph nodes. Thyroid gland, trachea, and esophagus demonstrate no significant findings. Lungs/Pleura: The lungs are symmetrically well expanded. Multifocal peribronchial ground-glass pulmonary infiltrate is present, best appreciated within the right mid lung zone and left lower lobe most in keeping with changes of multifocal pneumonia in the appropriate clinical setting. No pneumothorax or pleural effusion. No central obstructing lesion. Upper Abdomen: No acute abnormality. Musculoskeletal: No acute bone abnormality. No lytic or blastic bone lesion. Implanted loop recorder seen  within the a left  anterior chest wall. Review of the MIP images confirms the above findings. IMPRESSION: 1. No acute pulmonary embolism. 2. Eccentric filling defect and web within the left lower lobar segmental pulmonary arteries in keeping with the residua of chronic pulmonary embolism. 3. Moderate multi-vessel coronary artery calcification. 4. Multifocal peribronchial ground-glass pulmonary infiltrate most in keeping with changes of multifocal pneumonia in the appropriate clinical setting. Aortic Atherosclerosis (ICD10-I70.0). Electronically Signed   By: Fidela Salisbury M.D.   On: 07/27/2022 22:03   CT HEAD WO CONTRAST (5MM)  Result Date: 07/27/2022 CLINICAL DATA:  Mental status change of unknown cause. EXAM: CT HEAD WITHOUT CONTRAST TECHNIQUE: Contiguous axial images were obtained from the base of the skull through the vertex without intravenous contrast. RADIATION DOSE REDUCTION: This exam was performed according to the departmental dose-optimization program which includes automated exposure control, adjustment of the mA and/or kV according to patient size and/or use of iterative reconstruction technique. COMPARISON:  05/09/2022. FINDINGS: Brain: No evidence of acute infarction, hemorrhage, hydrocephalus, extra-axial collection or mass lesion/mass effect. Vascular: No hyperdense vessel or unexpected calcification. Skull: Normal. Negative for fracture or focal lesion. Sinuses/Orbits: Globes and orbits are unremarkable. Visualized sinuses are clear. Other: None. IMPRESSION: 1. No acute intracranial abnormalities. No change from the prior study. Electronically Signed   By: Lajean Manes M.D.   On: 07/27/2022 21:11   DG Chest Port 1 View  Result Date: 07/27/2022 CLINICAL DATA:  Altered mental status. EXAM: PORTABLE CHEST 1 VIEW COMPARISON:  X-ray 05/09/2022 and older FINDINGS: Hyperinflation. No consolidation, pneumothorax or effusion. No edema. Normal cardiopericardial silhouette. Calcified aorta. Overlapping cardiac leads.  Osteopenia. Loop recorder overlying the lower left thorax. IMPRESSION: Hyperinflation.  No acute cardiopulmonary disease. Electronically Signed   By: Jill Side M.D.   On: 07/27/2022 18:18      Subjective: Patient seen and examined at bedside today.  Hemodynamically stable.  On room air.  Actually she is alert and oriented today.  She knows the current month.  Not in any Distress.  Had a long discussion with daughter on phone twice about her discharge plan.  Discharge Exam: Vitals:   07/29/22 1227 07/29/22 1932  BP: (!) 147/66 (!) 160/69  Pulse: 80 (!) 102  Resp: 16 20  Temp: 97.8 F (36.6 C) 98.8 F (37.1 C)  SpO2: 100% 100%   Vitals:   07/28/22 2001 07/29/22 0445 07/29/22 1227 07/29/22 1932  BP: (!) 169/71 (!) 93/57 (!) 147/66 (!) 160/69  Pulse: 86 (!) 120 80 (!) 102  Resp: '18  16 20  '$ Temp: 98.7 F (37.1 C)  97.8 F (36.6 C) 98.8 F (37.1 C)  TempSrc: Oral  Oral Oral  SpO2: 100%  100% 100%  Weight:      Height:        General: Pt is alert, awake, not in acute distress,deconditioned,weak Cardiovascular: RRR, S1/S2 +, no rubs, no gallops Respiratory: CTA bilaterally, no wheezing, no rhonchi Abdominal: Soft, NT, ND, bowel sounds + Extremities: no edema, no cyanosis    The results of significant diagnostics from this hospitalization (including imaging, microbiology, ancillary and laboratory) are listed below for reference.     Microbiology: Recent Results (from the past 240 hour(s))  Resp panel by RT-PCR (RSV, Flu A&B, Covid) Anterior Nasal Swab     Status: None   Collection Time: 07/27/22  5:35 PM   Specimen: Anterior Nasal Swab  Result Value Ref Range Status   SARS Coronavirus 2 by RT PCR NEGATIVE NEGATIVE  Final    Comment: (NOTE) SARS-CoV-2 target nucleic acids are NOT DETECTED.  The SARS-CoV-2 RNA is generally detectable in upper respiratory specimens during the acute phase of infection. The lowest concentration of SARS-CoV-2 viral copies this assay can  detect is 138 copies/mL. A negative result does not preclude SARS-Cov-2 infection and should not be used as the sole basis for treatment or other patient management decisions. A negative result may occur with  improper specimen collection/handling, submission of specimen other than nasopharyngeal swab, presence of viral mutation(s) within the areas targeted by this assay, and inadequate number of viral copies(<138 copies/mL). A negative result must be combined with clinical observations, patient history, and epidemiological information. The expected result is Negative.  Fact Sheet for Patients:  EntrepreneurPulse.com.au  Fact Sheet for Healthcare Providers:  IncredibleEmployment.be  This test is no t yet approved or cleared by the [place] FDA and  has been authorized for detection and/or diagnosis of SARS-CoV-2 by FDA under an Emergency Use Authorization (EUA). This EUA will remain  in effect (meaning this test can be used) for the duration of the COVID-19 declaration under Section 564(b)(1) of the Act, 21 U.S.C.section 360bbb-3(b)(1), unless the authorization is terminated  or revoked sooner.       Influenza A by PCR NEGATIVE NEGATIVE Final   Influenza B by PCR NEGATIVE NEGATIVE Final    Comment: (NOTE) The Xpert Xpress SARS-CoV-2/FLU/RSV plus assay is intended as an aid in the diagnosis of influenza from Nasopharyngeal swab specimens and should not be used as a sole basis for treatment. Nasal washings and aspirates are unacceptable for Xpert Xpress SARS-CoV-2/FLU/RSV testing.  Fact Sheet for Patients: EntrepreneurPulse.com.au  Fact Sheet for Healthcare Providers: IncredibleEmployment.be  This test is not yet approved or cleared by the [place] FDA and has been authorized for detection and/or diagnosis of SARS-CoV-2 by FDA under an Emergency Use Authorization (EUA). This EUA will remain in  effect (meaning this test can be used) for the duration of the COVID-19 declaration under Section 564(b)(1) of the Act, 21 U.S.C. section 360bbb-3(b)(1), unless the authorization is terminated or revoked.     Resp Syncytial Virus by PCR NEGATIVE NEGATIVE Final    Comment: (NOTE) Fact Sheet for Patients: EntrepreneurPulse.com.au  Fact Sheet for Healthcare Providers: IncredibleEmployment.be  This test is not yet approved or cleared by the [place] FDA and has been authorized for detection and/or diagnosis of SARS-CoV-2 by FDA under an Emergency Use Authorization (EUA). This EUA will remain in effect (meaning this test can be used) for the duration of the COVID-19 declaration under Section 564(b)(1) of the Act, 21 U.S.C. section 360bbb-3(b)(1), unless the authorization is terminated or revoked.  Performed at [facility], [place] [address] [place], [place] [address]   Blood culture (routine x 2)     Status: None (Preliminary result)   Collection Time: 07/27/22  5:45 PM   Specimen: BLOOD  Result Value Ref Range Status   Specimen Description   Final    BLOOD BLOOD LEFT FOREARM Performed at [place] [address] [place], [place] [address]    Special Requests   Final    BOTTLES DRAWN AEROBIC AND ANAEROBIC Blood Culture results may not be optimal due to an inadequate volume of blood received in culture bottles Performed at [place] [address], [place],  [address]    Culture   Final    NO GROWTH 3 DAYS Performed at [facility], [address] [place], [place] [address]    Report Status PENDING  Incomplete  Blood culture (routine x 2)     Status: None (Preliminary result)   Collection Time: 07/27/22  5:50 PM   Specimen: BLOOD  Result Value Ref Range Status   Specimen Description   Final    BLOOD BLOOD RIGHT FOREARM Performed at [place] [address] [place], [place] [address]    Special Requests   Final    BOTTLES DRAWN AEROBIC AND ANAEROBIC Blood Culture results may not be optimal due to an inadequate volume of blood received in culture bottles Performed at [place] [address] [place], [place] [address]    Culture   Final    NO GROWTH 3 DAYS Performed at [facility], [place] [address] [place], [place] [address]    Report Status PENDING  Incomplete     Labs: BNP (last 3 results) No results for input(s): "BNP" in the last 8760 hours. Basic Metabolic Panel: Recent Labs  Lab 07/27/22 1750 07/28/22 0404 07/30/22 0958  NA 135 137 136  K 4.6 4.1 3.4*  CL 96* 102 103  CO2 '28 27 24  '$ GLUCOSE 225* 85 235*  BUN 37* 24* 17  CREATININE 1.11* 0.76 0.73  CALCIUM 9.8 9.6 9.3   Liver Function Tests: Recent Labs  Lab 07/27/22 1750  AST 48*  ALT 30  ALKPHOS 92  BILITOT <0.1*  PROT 6.4*  ALBUMIN 2.9*   No results for input(s): "LIPASE", "AMYLASE" in the last 168 hours. No results for input(s): "AMMONIA" in the last 168 hours. CBC: Recent Labs  Lab 07/27/22 1750 07/28/22 0404 07/30/22 0958  WBC 11.1* 8.8 9.9  NEUTROABS 9.8*  --   --   HGB 9.2* 9.7* 8.8*  HCT 28.7* 29.3* 27.4*  MCV 99.7 95.8 98.6  PLT 328 329 321   Cardiac Enzymes: No results for input(s): "CKTOTAL", "CKMB", "CKMBINDEX", "TROPONINI" in the last 168 hours. BNP: Invalid input(s): "POCBNP" CBG: Recent Labs  Lab 07/29/22 1618 07/29/22 1927 07/30/22 0410 07/30/22 0743 07/30/22 1159  GLUCAP 112* 336* 378* 218* 202*   D-Dimer No results for input(s): "DDIMER" in the last 72 hours. Hgb A1c No results for input(s): "HGBA1C" in the last 72 hours. Lipid Profile No results for input(s): "CHOL", "HDL", "LDLCALC", "TRIG", "CHOLHDL", "LDLDIRECT" in the last 72 hours. Thyroid function studies Recent Labs    07/27/22 1750  TSH 0.046*   Anemia work up No results for input(s): "VITAMINB12",  "FOLATE", "FERRITIN", "TIBC", "IRON", "RETICCTPCT" in the last 72 hours. Urinalysis    Component Value Date/Time   COLORURINE YELLOW 07/27/2022 2032   APPEARANCEUR CLEAR 07/27/2022 2032   LABSPEC 1.010 07/27/2022 2032   PHURINE 7.0 07/27/2022 2032   GLUCOSEU NEGATIVE 07/27/2022 2032   HGBUR NEGATIVE 07/27/2022 2032   BILIRUBINUR NEGATIVE 07/27/2022 2032   BILIRUBINUR neg 05/06/2015 1421   KETONESUR NEGATIVE 07/27/2022 2032   PROTEINUR NEGATIVE 07/27/2022 2032   UROBILINOGEN 1.0 05/06/2015 1421   UROBILINOGEN 0.2 12/25/2014 1206   NITRITE NEGATIVE 07/27/2022 2032   LEUKOCYTESUR NEGATIVE 07/27/2022 2032   Sepsis Labs Recent Labs  Lab 07/27/22 1750 07/28/22 0404 07/30/22 0958  WBC 11.1* 8.8 9.9   Microbiology Recent Results (from the past 240 hour(s))  Resp panel by RT-PCR (RSV, Flu A&B, Covid) Anterior Nasal Swab     Status: None   Collection Time: 07/27/22  5:35 PM   Specimen: Anterior Nasal Swab  Result Value Ref  Range Status   SARS Coronavirus 2 by RT PCR NEGATIVE NEGATIVE Final    Comment: (NOTE) SARS-CoV-2 target nucleic acids are NOT DETECTED.  The SARS-CoV-2 RNA is generally detectable in upper respiratory specimens during the acute phase of infection. The lowest concentration of SARS-CoV-2 viral copies this assay can detect is 138 copies/mL. A negative result does not preclude SARS-Cov-2 infection and should not be used as the sole basis for treatment or other patient management decisions. A negative result may occur with  improper specimen collection/handling, submission of specimen other than nasopharyngeal swab, presence of viral mutation(s) within the areas targeted by this assay, and inadequate number of viral copies(<138 copies/mL). A negative result must be combined with clinical observations, patient history, and epidemiological information. The expected result is Negative.  Fact Sheet for Patients:  EntrepreneurPulse.com.au  Fact  Sheet for Healthcare Providers:  IncredibleEmployment.be  This test is no t yet approved or cleared by the [place] FDA and  has been authorized for detection and/or diagnosis of SARS-CoV-2 by FDA under an Emergency Use Authorization (EUA). This EUA will remain  in effect (meaning this test can be used) for the duration of the COVID-19 declaration under Section 564(b)(1) of the Act, 21 U.S.C.section 360bbb-3(b)(1), unless the authorization is terminated  or revoked sooner.       Influenza A by PCR NEGATIVE NEGATIVE Final   Influenza B by PCR NEGATIVE NEGATIVE Final    Comment: (NOTE) The Xpert Xpress SARS-CoV-2/FLU/RSV plus assay is intended as an aid in the diagnosis of influenza from Nasopharyngeal swab specimens and should not be used as a sole basis for treatment. Nasal washings and aspirates are unacceptable for Xpert Xpress SARS-CoV-2/FLU/RSV testing.  Fact Sheet for Patients: EntrepreneurPulse.com.au  Fact Sheet for Healthcare Providers: IncredibleEmployment.be  This test is not yet approved or cleared by the [place] FDA and has been authorized for detection and/or diagnosis of SARS-CoV-2 by FDA under an Emergency Use Authorization (EUA). This EUA will remain in effect (meaning this test can be used) for the duration of the COVID-19 declaration under Section 564(b)(1) of the Act, 21 U.S.C. section 360bbb-3(b)(1), unless the authorization is terminated or revoked.     Resp Syncytial Virus by PCR NEGATIVE NEGATIVE Final    Comment: (NOTE) Fact Sheet for Patients: EntrepreneurPulse.com.au  Fact Sheet for Healthcare Providers: IncredibleEmployment.be  This test is not yet approved or cleared by the [place] FDA and has been authorized for detection and/or diagnosis of SARS-CoV-2 by FDA under an Emergency Use Authorization (EUA). This EUA will remain in effect  (meaning this test can be used) for the duration of the COVID-19 declaration under Section 564(b)(1) of the Act, 21 U.S.C. section 360bbb-3(b)(1), unless the authorization is terminated or revoked.  Performed at [facility], [place] [address] [place], [place] [address]   Blood culture (routine x 2)     Status: None (Preliminary result)   Collection Time: 07/27/22  5:45 PM   Specimen: BLOOD  Result Value Ref Range Status   Specimen Description   Final    BLOOD BLOOD LEFT FOREARM Performed at [facility] [address] [place], [place] [address]    Special Requests   Final    BOTTLES DRAWN AEROBIC AND ANAEROBIC Blood Culture results may not be optimal due to an inadequate volume of blood received in culture bottles Performed at [facility] [address] [place], [place] [address]    Culture   Final  NO GROWTH 3 DAYS Performed at [facility], [place] [address], [place], [place] [address]    Report Status PENDING  Incomplete  Blood culture (routine x 2)     Status: None (Preliminary result)   Collection Time: 07/27/22  5:50 PM   Specimen: BLOOD  Result Value Ref Range Status   Specimen Description   Final    BLOOD BLOOD RIGHT FOREARM Performed at [facility] [address], [place], [place] [address]    Special Requests   Final    BOTTLES DRAWN AEROBIC AND ANAEROBIC Blood Culture results may not be optimal due to an inadequate volume of blood received in culture bottles Performed at [place] [address] [place], [place] [address]    Culture   Final    NO GROWTH 3 DAYS Performed at [facility], [place] [address] [place],  [address]    Report Status PENDING  Incomplete    Please note: You were cared for by a hospitalist during your hospital stay. Once you are discharged, your primary care physician will handle any further medical issues. Please note  that NO REFILLS for any discharge medications will be authorized once you are discharged, as it is imperative that you return to your primary care physician (or establish a relationship with a primary care physician if you do not have one) for your post hospital discharge needs so that they can reassess your need for medications and monitor your lab values.    Time coordinating discharge: 40 minutes  SIGNED:   Shelly Coss, MD  [facility] 07/30/2022, 1:19 PM Pager [phone]  If 7PM-7AM, please contact night-coverage [email or web address] Password [email or web address]

## 2022-07-30 NOTE — Progress Notes (Signed)
Speech Language Pathology Treatment: Dysphagia  Patient Details Name: [name] [name] MRN: [id number] DOB: [birth year] Today's Date: 07/30/2022 Time: 6073-7106 SLP Time Calculation (min) (ACUTE ONLY): 34 min  Assessment / Plan / Recommendation Clinical Impression  SLP visit per MD request - for swallowing education/precaution review. SLP met [name] yesterday during MBS and today met with both daughters. Reviewed flouro loops of study - provided extensive information re: comfort feeding including starting intake with liquid, small bites/sips when pt requesting, when fully alert and accurate and only as tolerated. Provided pt and family with written and verbal precautions using teach back. Reiterated comfort po - as family inquiring about concern for pt "starving". SLP then inquired as to po amount prior to admission - to which family stated "she was trying". When inquired if she was "enjoying" intake, they advised no. Strict comfort po is few bites/sips as tolerated for enjoyment. Using teach back, family stated they will offer her a few bites/sips throughout the day. Reviewed silent aspiration and lack of sensation to pharyngeal residuals - thus is pt is clinically having difficulies *i.e coughing, gagging, have difficulty with breathing* encourage pt to cough to clear and stop intake. Goal is to prevent these difficulties from occuring with comfort intake. All education completed, no follow up needed.     HPI HPI: [name] [name] is a 89 y.o. female with medical history significant for dementia, dysphagia, T2DM, HTN, HLD, hypothyroidism, normocytic anemia, history of seizure-like activity on Keppra, dermatomyositis on chronic prednisone and methotrexate, dysphagia, history of syncope due to autonomic dysfunction and vasovagal episodes who presented to the ED for evaluation of altered mental status. hest CT showed multifocal pulmonary infiltrates potentially from aspiration events. Per chart, patient has  been having issues with dysphagia and choking sensation when eating meals, family has been concerned about aspiration and was seen by SLP at outpatient facility. Severl hours later she was noted to have cough with thick mucus output and concern for aspiration. Family reports that she has been having progressive functional decline over the last month or so. MBS 04/2022 with silent aspiration rec'ing nectar thick, regular (family ordered softer foods).  Family had determine pt appropriate for hospice/comfort.  MD contacted this SLP re: family having questions about diet recommendations.      SLP Plan  All goals met      Recommendations for follow up therapy are one component of a multi-disciplinary discharge planning process, led by the attending physician.  Recommendations may be updated based on patient status, additional functional criteria and insurance authorization.    Recommendations  Diet recommendations: Dysphagia 3 (mechanical soft);Thin liquid;Nectar-thick liquid (comfort po, few bites/sips only) Supervision: Patient able to self feed Compensations: Slow rate;Small sips/bites Postural Changes and/or Swallow Maneuvers: Upright 30-60 min after meal;Seated upright 90 degrees                Oral Care Recommendations: Oral care BID Follow Up Recommendations: No SLP follow up SLP Visit Diagnosis: Dysphagia, pharyngoesophageal phase (R13.14) Plan: All goals met         Kathleen Lime, MS [facility] SLP Acute Rehab Services Office [phone] Pager [phone]   Macario Golds  07/30/2022, 11:39 AM

## 2022-07-30 NOTE — Progress Notes (Signed)
[facility]) Hospital Liaison Note   Received request from [facility], [place], LCSW, for hospice services at home after discharge.    Spoke with patient's daughters, Edwin Cap and Howell Rucks, to initiate education related to hospice philosophy, services, and team approach to care. Daughters verbalized understanding of information given. Per discussion, the plan is for patient to discharge home via private car today.    DME needs discussed. Patient has the following equipment in the home (Purchased privately): Wheelchair, walker, bedside commode Patient requests the following equipment for delivery: None for now   Please send signed and completed DNR home with patient/family. Please provide prescriptions at discharge as needed to ensure ongoing symptom management.    [facility] information and contact numbers given to family & above information shared with TOC.   Please call with any questions/concerns.    Thank you for the opportunity to participate in this patient's care.   Zigmund Gottron  [facility] Liaison  [phone]

## 2022-07-30 NOTE — Plan of Care (Signed)
Problem: Education: Goal: Ability to describe self-care measures that may prevent or decrease complications (Diabetes Survival Skills Education) will improve 07/30/2022 1404 by Linus Salmons, RN Outcome: Adequate for Discharge 07/30/2022 1343 by Linus Salmons, RN Outcome: Progressing Goal: Individualized Educational Video(s) 07/30/2022 1404 by Linus Salmons, RN Outcome: Adequate for Discharge 07/30/2022 1343 by Linus Salmons, RN Outcome: Progressing   Problem: Coping: Goal: Ability to adjust to condition or change in health will improve 07/30/2022 1404 by Linus Salmons, RN Outcome: Adequate for Discharge 07/30/2022 1343 by Linus Salmons, RN Outcome: Progressing   Problem: Fluid Volume: Goal: Ability to maintain a balanced intake and output will improve 07/30/2022 1404 by Linus Salmons, RN Outcome: Adequate for Discharge 07/30/2022 1343 by Linus Salmons, RN Outcome: Progressing   Problem: Health Behavior/Discharge Planning: Goal: Ability to identify and utilize available resources and services will improve 07/30/2022 1404 by Linus Salmons, RN Outcome: Adequate for Discharge 07/30/2022 1343 by Linus Salmons, RN Outcome: Progressing Goal: Ability to manage health-related needs will improve 07/30/2022 1404 by Linus Salmons, RN Outcome: Adequate for Discharge 07/30/2022 1343 by Linus Salmons, RN Outcome: Progressing   Problem: Metabolic: Goal: Ability to maintain appropriate glucose levels will improve 07/30/2022 1404 by Linus Salmons, RN Outcome: Adequate for Discharge 07/30/2022 1343 by Linus Salmons, RN Outcome: Progressing   Problem: Nutritional: Goal: Maintenance of adequate nutrition will improve 07/30/2022 1404 by Linus Salmons, RN Outcome: Adequate for Discharge 07/30/2022 1343 by Linus Salmons, RN Outcome: Progressing Goal: Progress toward achieving an optimal weight will improve 07/30/2022 1404 by Linus Salmons, RN Outcome: Adequate for Discharge 07/30/2022  1343 by Linus Salmons, RN Outcome: Progressing   Problem: Skin Integrity: Goal: Risk for impaired skin integrity will decrease 07/30/2022 1404 by Linus Salmons, RN Outcome: Adequate for Discharge 07/30/2022 1343 by Linus Salmons, RN Outcome: Progressing   Problem: Tissue Perfusion: Goal: Adequacy of tissue perfusion will improve 07/30/2022 1404 by Linus Salmons, RN Outcome: Adequate for Discharge 07/30/2022 1343 by Linus Salmons, RN Outcome: Progressing   Problem: Education: Goal: Knowledge of General Education information will improve Description: Including pain rating scale, medication(s)/side effects and non-pharmacologic comfort measures 07/30/2022 1404 by Linus Salmons, RN Outcome: Adequate for Discharge 07/30/2022 1343 by Linus Salmons, RN Outcome: Progressing   Problem: Health Behavior/Discharge Planning: Goal: Ability to manage health-related needs will improve 07/30/2022 1404 by Linus Salmons, RN Outcome: Adequate for Discharge 07/30/2022 1343 by Linus Salmons, RN Outcome: Progressing   Problem: Clinical Measurements: Goal: Ability to maintain clinical measurements within normal limits will improve 07/30/2022 1404 by Linus Salmons, RN Outcome: Adequate for Discharge 07/30/2022 1343 by Linus Salmons, RN Outcome: Progressing Goal: Will remain free from infection 07/30/2022 1404 by Linus Salmons, RN Outcome: Adequate for Discharge 07/30/2022 1343 by Linus Salmons, RN Outcome: Progressing Goal: Diagnostic test results will improve 07/30/2022 1404 by Linus Salmons, RN Outcome: Adequate for Discharge 07/30/2022 1343 by Linus Salmons, RN Outcome: Progressing Goal: Respiratory complications will improve 07/30/2022 1404 by Linus Salmons, RN Outcome: Adequate for Discharge 07/30/2022 1343 by Linus Salmons, RN Outcome: Progressing Goal: Cardiovascular complication will be avoided 07/30/2022 1404 by Linus Salmons, RN Outcome: Adequate for Discharge 07/30/2022 1343  by Linus Salmons, RN Outcome: Progressing   Problem: Activity: Goal: Risk for activity intolerance will decrease 07/30/2022 1404 by Linus Salmons, RN Outcome: Adequate for Discharge  07/30/2022 1343 by Linus Salmons, RN Outcome: Progressing   Problem: Nutrition: Goal: Adequate nutrition will be maintained 07/30/2022 1404 by Linus Salmons, RN Outcome: Adequate for Discharge 07/30/2022 1343 by Linus Salmons, RN Outcome: Progressing   Problem: Coping: Goal: Level of anxiety will decrease 07/30/2022 1404 by Linus Salmons, RN Outcome: Adequate for Discharge 07/30/2022 1343 by Linus Salmons, RN Outcome: Progressing   Problem: Elimination: Goal: Will not experience complications related to bowel motility 07/30/2022 1404 by Linus Salmons, RN Outcome: Adequate for Discharge 07/30/2022 1343 by Linus Salmons, RN Outcome: Progressing Goal: Will not experience complications related to urinary retention 07/30/2022 1404 by Linus Salmons, RN Outcome: Adequate for Discharge 07/30/2022 1343 by Linus Salmons, RN Outcome: Progressing   Problem: Pain Managment: Goal: General experience of comfort will improve 07/30/2022 1404 by Linus Salmons, RN Outcome: Adequate for Discharge 07/30/2022 1343 by Linus Salmons, RN Outcome: Progressing   Problem: Safety: Goal: Ability to remain free from injury will improve 07/30/2022 1404 by Linus Salmons, RN Outcome: Adequate for Discharge 07/30/2022 1343 by Linus Salmons, RN Outcome: Progressing   Problem: Skin Integrity: Goal: Risk for impaired skin integrity will decrease 07/30/2022 1404 by Linus Salmons, RN Outcome: Adequate for Discharge 07/30/2022 1343 by Linus Salmons, RN Outcome: Progressing   Problem: Acute Rehab PT Goals(only PT should resolve) Goal: Pt Will Go Supine/Side To Sit Outcome: Adequate for Discharge Goal: Patient Will Transfer Sit To/From Stand Outcome: Adequate for Discharge Goal: Pt Will Ambulate Outcome: Adequate for  Discharge   Problem: Acute Rehab OT Goals (only OT should resolve) Goal: Pt. Will Perform Grooming Outcome: Adequate for Discharge Goal: Pt. Will Perform Upper Body Dressing Outcome: Adequate for Discharge Goal: Pt. Will Transfer To Toilet Outcome: Adequate for Discharge Goal: Pt/Caregiver Will Perform Home Exercise Program Outcome: Adequate for Discharge   Problem: Safety: Goal: Non-violent Restraint(s) 07/30/2022 1404 by Linus Salmons, RN Outcome: Adequate for Discharge 07/30/2022 1343 by Linus Salmons, RN Outcome: Progressing

## 2022-07-30 NOTE — Progress Notes (Signed)
Daily Progress Note   Patient Name: [name] [name]       Date: 07/30/2022 DOB: 09/21/1933  Age: 89 y.o. MRN#: [id number] Attending Physician: Shelly Coss, MD Primary Care Physician: Lajean Manes, MD Admit Date: 07/27/2022  Reason for Consultation/Follow-up: Establishing goals of care  Subjective: Resting in bed, awaiting d/c  Length of Stay: 3  Current Medications: Scheduled Meds:   heparin  5,000 Units Subcutaneous Q8H   insulin aspart  0-9 Units Subcutaneous Q4H   insulin glargine-yfgn  5 Units Subcutaneous Daily   levETIRAcetam  500 mg Oral BID   levothyroxine  200 mcg Oral Q0600   nystatin  5 mL Oral QID   predniSONE  5 mg Oral Q breakfast    Continuous Infusions:  ampicillin-sulbactam (UNASYN) IV 1.5 g (07/30/22 0937)    PRN Meds: acetaminophen **OR** acetaminophen, hydrALAZINE, loratadine, ondansetron **OR** ondansetron (ZOFRAN) IV, senna-docusate, triamcinolone ointment  Physical Exam         pleasantly confused, very deconditioned  Diminished bases S 1 S 2 Abdomen not distended No edema  Vital Signs: BP (!) 160/69 (BP Location: Right Arm)   Pulse (!) 102   Temp 98.8 F (37.1 C) (Oral)   Resp 20   Ht '5\' 6"'$  (1.676 m)   Wt 49.9 kg   SpO2 100%   BMI 17.76 kg/m  SpO2: SpO2: 100 % O2 Device: O2 Device: Room Air O2 Flow Rate:    Intake/output summary:  Intake/Output Summary (Last 24 hours) at 07/30/2022 1148 Last data filed at 07/30/2022 0900 Gross per 24 hour  Intake 1179.12 ml  Output 150 ml  Net 1029.12 ml    LBM: Last BM Date : 07/28/22 Baseline Weight: Weight: 49.9 kg Most recent weight: Weight: 49.9 kg       Palliative Assessment/Data:      Patient Active Problem List   Diagnosis Date Noted   Palliative care by specialist 07/29/2022    Goals of care, counseling/discussion 07/29/2022   Normocytic anemia 07/27/2022   Dementia without behavioral disturbance ([place]) 07/27/2022   Malnutrition of moderate degree 05/11/2022   Near syncope 05/09/2022   Aspiration pneumonia ([place]) 01/19/2022   Fever 01/16/2022   Syncope 01/13/2022   Fever, unknown origin 01/13/2022   Elevated troponin 01/13/2022   COVID-19 virus infection 09/14/2021   Syncope and collapse 09/13/2021  Dermatopolymyositis, unspecified, organ involvement unspecified ([place]) 09/13/2021   Poor appetite 09/13/2021   Hyponatremia 09/13/2021   Uncontrolled diabetes mellitus with hypoglycemia without coma, with long-term current use of insulin ([place]) 09/13/2021   Sacral ulcer ([place]) 09/13/2021   Allergic conjunctivitis of both eyes 03/15/2020   PAD (peripheral artery disease) ([place]) 10/24/2019   Malnutrition ([place]) 10/10/2019   History of seizure 34/74/2595   Acute metabolic encephalopathy 63/87/5643   Right shoulder pain 08/21/2019   Leukocytosis 08/21/2019   Acute encephalopathy 08/18/2019   Enterocolitis 08/18/2019   Hyperglycemia 08/18/2019   Chronic kidney disease    Pressure injury of skin 12/09/2016   SBO (small bowel obstruction) s/p lysis of adhesions 12/03/2016 12/04/2016   Acute urinary retention 11/27/2016   Ascites 11/27/2016   Abdominal distension    Facial droop    Hyperkalemia 11/25/2016   Sepsis ([place]) 11/25/2016   Acute renal failure superimposed on stage 2 chronic kidney disease ([place]) 11/25/2016   Delirium 11/25/2016   Age-related osteoporosis without current pathological fracture 10/01/2016   Hyperlipidemia 06/10/2016   GERD (gastroesophageal reflux disease) 06/10/2016   Insomnia 06/10/2016   Viral gastroenteritis    Right hip pain 05/06/2015   Vaginal atrophy 11/01/2013   ECZEMA 07/24/2008   INTERMITTENT VERTIGO 07/24/2008   Constipation 11/16/2007   INSOMNIA 08/12/2007   Oral thrush 08/03/2007   Hypertension associated with diabetes  ([place]) 02/14/2007   Allergic rhinitis due to allergen 02/14/2007   Hypothyroidism, acquired 01/26/2007   Insulin dependent type 2 diabetes mellitus ([place]) 01/26/2007   GERD 01/26/2007   DEGENERATIVE JOINT DISEASE 01/26/2007    Palliative Care Assessment & Plan   Patient Profile:    Assessment: Aspiration pneumonia CT showing multifocal pulmonary infiltrates likely aspiration pneumonia Acute metabolic encephalopathy has underlying dementia Acute kidney injury on top of stage II chronic kidney disease History of tremor total myositis History of seizures hypertension and diabetes   Recommendations/Plan:  Comfort feeds Home with hospice.  Appreciate hospice SLP and TOC support.  Goals of Care and Additional Recommendations: Limitations on Scope of Treatment: No Artificial Feeding  Code Status:    Code Status Orders  (From admission, onward)           Start     Ordered   07/27/22 2316  Do not attempt resuscitation (DNR)  Continuous       Question Answer Comment  If patient has no pulse and is not breathing Do Not Attempt Resuscitation   If patient has a pulse and/or is breathing: Medical Treatment Goals LIMITED ADDITIONAL INTERVENTIONS: Use medication/IV fluids and cardiac monitoring as indicated; Do not use intubation or mechanical ventilation (DNI), also provide comfort medications.  Transfer to Progressive/Stepdown as indicated, avoid Intensive Care.   Consent: Discussion documented in EHR or advanced directives reviewed      07/27/22 2317           Code Status History     Date Active Date Inactive Code Status Order ID Comments User Context   05/09/2022 2153 05/13/2022 2041 Full Code [id number]  Kayleen Memos, DO ED   01/13/2022 1556 01/20/2022 1935 DNR [id number]  Jonnie Finner, DO Inpatient   09/13/2021 1921 09/16/2021 1758 DNR [id number]  Lavina Hamman, MD ED   09/13/2021 1901 09/13/2021 1921 Full Code [id number]  Lavina Hamman, MD ED   10/08/2019 1040 10/12/2019  1639 Full Code [id number]  Vonzella Nipple, NP ED   08/21/2019 1118 08/23/2019 2332 Full Code [id number]  Norval Morton, MD ED  08/18/2019 0602 08/20/2019 1748 Full Code [id number]  Shela Leff, MD Inpatient   11/26/2016 0048 12/09/2016 1943 Full Code [id number]  Edwin Dada, MD Inpatient   06/10/2016 1355 06/11/2016 1537 Full Code [id number]  Elease Hashimoto ED      Advance Directive Documentation    Flowsheet Row Most Recent Value  Type of Advance Directive Out of facility DNR (pink MOST or yellow form)  Pre-existing out of facility DNR order (yellow form or pink MOST form) --  "MOST" Form in Place? --       Prognosis:  < 6 months  Discharge Planning: Home with Hospice  Care plan was discussed with IDT  Thank you for allowing the Palliative Medicine Team to assist in the care of this patient.  low MDM     Greater than 50%  of this time was spent counseling and coordinating care related to the above assessment and plan.  Loistine Chance, MD  Please contact Palliative Medicine Team phone at [phone] for questions and concerns.

## 2022-07-30 NOTE — Plan of Care (Signed)
  Problem: Education: Goal: Ability to describe self-care measures that may prevent or decrease complications (Diabetes Survival Skills Education) will improve Outcome: Progressing Goal: Individualized Educational Video(s) Outcome: Progressing   Problem: Coping: Goal: Ability to adjust to condition or change in health will improve Outcome: Progressing   Problem: Fluid Volume: Goal: Ability to maintain a balanced intake and output will improve Outcome: Progressing   Problem: Health Behavior/Discharge Planning: Goal: Ability to identify and utilize available resources and services will improve Outcome: Progressing Goal: Ability to manage health-related needs will improve Outcome: Progressing   Problem: Metabolic: Goal: Ability to maintain appropriate glucose levels will improve Outcome: Progressing   Problem: Nutritional: Goal: Maintenance of adequate nutrition will improve Outcome: Progressing Goal: Progress toward achieving an optimal weight will improve Outcome: Progressing   Problem: Skin Integrity: Goal: Risk for impaired skin integrity will decrease Outcome: Progressing   Problem: Tissue Perfusion: Goal: Adequacy of tissue perfusion will improve Outcome: Progressing   Problem: Education: Goal: Knowledge of General Education information will improve Description: Including pain rating scale, medication(s)/side effects and non-pharmacologic comfort measures Outcome: Progressing   Problem: Health Behavior/Discharge Planning: Goal: Ability to manage health-related needs will improve Outcome: Progressing   Problem: Clinical Measurements: Goal: Ability to maintain clinical measurements within normal limits will improve Outcome: Progressing Goal: Will remain free from infection Outcome: Progressing Goal: Diagnostic test results will improve Outcome: Progressing Goal: Respiratory complications will improve Outcome: Progressing Goal: Cardiovascular complication will  be avoided Outcome: Progressing   Problem: Activity: Goal: Risk for activity intolerance will decrease Outcome: Progressing   Problem: Nutrition: Goal: Adequate nutrition will be maintained Outcome: Progressing   Problem: Coping: Goal: Level of anxiety will decrease Outcome: Progressing   Problem: Elimination: Goal: Will not experience complications related to bowel motility Outcome: Progressing Goal: Will not experience complications related to urinary retention Outcome: Progressing   Problem: Pain Managment: Goal: General experience of comfort will improve Outcome: Progressing   Problem: Safety: Goal: Ability to remain free from injury will improve Outcome: Progressing   Problem: Skin Integrity: Goal: Risk for impaired skin integrity will decrease Outcome: Progressing   Problem: Safety: Goal: Non-violent Restraint(s) Outcome: Progressing

## 2022-07-30 NOTE — TOC Initial Note (Signed)
Transition of Care [facility]) - Initial/Assessment Note    Patient Details  Name: [name] [name] MRN: [id number] Date of Birth: 04-25-1934  Transition of Care [facility] CM/SW Contact:    Illene Regulus, LCSW Phone Number: 07/30/2022, 10:21 AM  Clinical Narrative:                 CSW received a consult regarding home hospice services. CSW spoke with pt's daughters [place] and Edwin Cap. The daughters reported no preference in agency. CSW made a referral to [facility] for Home hospice services. Pt's daughters did have concerns about pt's diet and whether the hospice agency can come to both of their homes, as the pt will be staying with both. CSW suggested pt's daughters to speak with the Hospice agency. TOC to follow.   Expected Discharge Plan: Home w Hospice Care Barriers to Discharge: Continued Medical Work up   Patient Goals and CMS Choice Patient states their goals for this hospitalization and ongoing recovery are:: return home CMS Medicare.gov Compare Post Acute Care list provided to:: Patient Represenative (must comment) Choice offered to / list presented to : Adult Children      Expected Discharge Plan and Services       Living arrangements for the past 2 months: Single Family Home                                      Prior Living Arrangements/Services Living arrangements for the past 2 months: Single Family Home Lives with:: Self, Adult Children Patient language and need for interpreter reviewed:: No Do you feel safe going back to the place where you live?: Yes      Need for Family Participation in Patient Care: Yes (Comment) Care giver support system in place?: Yes (comment)   Criminal Activity/Legal Involvement Pertinent to Current Situation/Hospitalization: No - Comment as needed  Activities of Daily Living Home Assistive Devices/Equipment: Environmental consultant (specify type), Wheelchair, Eyeglasses, Dentures (specify type) ADL Screening (condition at time of  admission) Patient's cognitive ability adequate to safely complete daily activities?: No Is the patient deaf or have difficulty hearing?: Yes Does the patient have difficulty seeing, even when wearing glasses/contacts?: No Does the patient have difficulty concentrating, remembering, or making decisions?: Yes Patient able to express need for assistance with ADLs?: No Does the patient have difficulty dressing or bathing?: Yes Independently performs ADLs?: No Communication: Dependent Is this a change from baseline?: Pre-admission baseline Dressing (OT): Dependent Is this a change from baseline?: Pre-admission baseline Grooming: Dependent Is this a change from baseline?: Pre-admission baseline Feeding: Dependent Is this a change from baseline?: Pre-admission baseline Bathing: Dependent Is this a change from baseline?: Pre-admission baseline Toileting: Dependent Is this a change from baseline?: Pre-admission baseline In/Out Bed: Dependent Is this a change from baseline?: Pre-admission baseline Walks in Home: Dependent Is this a change from baseline?: Pre-admission baseline Does the patient have difficulty walking or climbing stairs?: Yes Weakness of Legs: Both Weakness of Arms/Hands: None  Permission Sought/Granted      Share Information with NAME: McCorkle,Carmel, McCorkle,Alesia  Permission granted to share info w AGENCY: yes  Permission granted to share info w Relationship: daughters  Permission granted to share info w Contact Information: Marla Roe  Emotional Assessment Appearance:: Appears stated age Attitude/Demeanor/Rapport: Unable to Assess Affect (typically observed): Unable to Assess Orientation: : Oriented to Self Alcohol / Substance Use: Not Applicable Psych Involvement: No (comment)  Admission  diagnosis:  Aspiration pneumonia ([place]) [J69.0] Encephalopathy [G93.40] Aspiration pneumonia, unspecified aspiration pneumonia type, unspecified  laterality, unspecified part of lung ([place]) [J69.0] Patient Active Problem List   Diagnosis Date Noted   Palliative care by specialist 07/29/2022   Goals of care, counseling/discussion 07/29/2022   Normocytic anemia 07/27/2022   Dementia without behavioral disturbance ([place]) 07/27/2022   Malnutrition of moderate degree 05/11/2022   Near syncope 05/09/2022   Aspiration pneumonia ([place]) 01/19/2022   Fever 01/16/2022   Syncope 01/13/2022   Fever, unknown origin 01/13/2022   Elevated troponin 01/13/2022   COVID-19 virus infection 09/14/2021   Syncope and collapse 09/13/2021   Dermatopolymyositis, unspecified, organ involvement unspecified ([place]) 09/13/2021   Poor appetite 09/13/2021   Hyponatremia 09/13/2021   Uncontrolled diabetes mellitus with hypoglycemia without coma, with long-term current use of insulin ([place]) 09/13/2021   Sacral ulcer ([place]) 09/13/2021   Allergic conjunctivitis of both eyes 03/15/2020   PAD (peripheral artery disease) ([place]) 10/24/2019   Malnutrition ([place]) 10/10/2019   History of seizure 07/08/7587   Acute metabolic encephalopathy 32/54/9826   Right shoulder pain 08/21/2019   Leukocytosis 08/21/2019   Acute encephalopathy 08/18/2019   Enterocolitis 08/18/2019   Hyperglycemia 08/18/2019   Chronic kidney disease    Pressure injury of skin 12/09/2016   SBO (small bowel obstruction) s/p lysis of adhesions 12/03/2016 12/04/2016   Acute urinary retention 11/27/2016   Ascites 11/27/2016   Abdominal distension    Facial droop    Hyperkalemia 11/25/2016   Sepsis ([place]) 11/25/2016   Acute renal failure superimposed on stage 2 chronic kidney disease ([place]) 11/25/2016   Delirium 11/25/2016   Age-related osteoporosis without current pathological fracture 10/01/2016   Hyperlipidemia 06/10/2016   GERD (gastroesophageal reflux disease) 06/10/2016   Insomnia 06/10/2016   Viral gastroenteritis    Right hip pain 05/06/2015   Vaginal atrophy 11/01/2013   ECZEMA 07/24/2008    INTERMITTENT VERTIGO 07/24/2008   Constipation 11/16/2007   INSOMNIA 08/12/2007   Oral thrush 08/03/2007   Hypertension associated with diabetes ([place]) 02/14/2007   Allergic rhinitis due to allergen 02/14/2007   Hypothyroidism, acquired 01/26/2007   Insulin dependent type 2 diabetes mellitus ([place]) 01/26/2007   GERD 01/26/2007   DEGENERATIVE JOINT DISEASE 01/26/2007   PCP:  Lajean Manes, MD Pharmacy:   [facility] [place], [address] [place] [address] Phone: [phone] Fax: [phone]     Social Determinants of Health (SDOH) Social History: SDOH Screenings   Food Insecurity: No Food Insecurity (07/28/2022)  Housing: Low Risk  (07/28/2022)  Transportation Needs: No Transportation Needs (07/28/2022)  Utilities: Not At Risk (07/28/2022)  Tobacco Use: Medium Risk (07/27/2022)   SDOH Interventions:     Readmission Risk Interventions    01/19/2022   11:11 AM  Readmission Risk Prevention Plan  Transportation Screening Complete  PCP or Specialist Appt within 3-5 Days Complete  HRI or [place] Complete  Social Work Consult for [place] Planning/Counseling Complete  Palliative Care Screening Not Applicable  Medication Review Press photographer) Complete

## 2022-08-01 ENCOUNTER — Encounter (HOSPITAL_COMMUNITY): Payer: Self-pay

## 2022-08-01 ENCOUNTER — Emergency Department (HOSPITAL_COMMUNITY)
Admission: EM | Admit: 2022-08-01 | Discharge: 2022-08-01 | Disposition: A | Discharge status: Home / Self Care | Payer: Medicare HMO | Attending: Emergency Medicine | Admitting: Emergency Medicine

## 2022-08-01 ENCOUNTER — Emergency Department (HOSPITAL_COMMUNITY): Payer: Medicare HMO

## 2022-08-01 ENCOUNTER — Other Ambulatory Visit: Payer: Self-pay

## 2022-08-01 DIAGNOSIS — R4182 Altered mental status, unspecified: Secondary | ICD-10-CM | POA: Diagnosis not present

## 2022-08-01 DIAGNOSIS — N189 Chronic kidney disease, unspecified: Secondary | ICD-10-CM | POA: Diagnosis not present

## 2022-08-01 DIAGNOSIS — R404 Transient alteration of awareness: Secondary | ICD-10-CM | POA: Insufficient documentation

## 2022-08-01 DIAGNOSIS — R69 Illness, unspecified: Secondary | ICD-10-CM | POA: Diagnosis not present

## 2022-08-01 DIAGNOSIS — F0390 Unspecified dementia without behavioral disturbance: Secondary | ICD-10-CM | POA: Diagnosis not present

## 2022-08-01 DIAGNOSIS — E1122 Type 2 diabetes mellitus with diabetic chronic kidney disease: Secondary | ICD-10-CM | POA: Diagnosis not present

## 2022-08-01 DIAGNOSIS — J189 Pneumonia, unspecified organism: Secondary | ICD-10-CM | POA: Diagnosis not present

## 2022-08-01 DIAGNOSIS — R069 Unspecified abnormalities of breathing: Secondary | ICD-10-CM | POA: Diagnosis not present

## 2022-08-01 DIAGNOSIS — R55 Syncope and collapse: Secondary | ICD-10-CM | POA: Insufficient documentation

## 2022-08-01 DIAGNOSIS — Z794 Long term (current) use of insulin: Secondary | ICD-10-CM | POA: Insufficient documentation

## 2022-08-01 DIAGNOSIS — E039 Hypothyroidism, unspecified: Secondary | ICD-10-CM | POA: Diagnosis not present

## 2022-08-01 DIAGNOSIS — Z79890 Hormone replacement therapy (postmenopausal): Secondary | ICD-10-CM | POA: Diagnosis not present

## 2022-08-01 DIAGNOSIS — I10 Essential (primary) hypertension: Secondary | ICD-10-CM | POA: Diagnosis not present

## 2022-08-01 LAB — COMPREHENSIVE METABOLIC PANEL
ALT: 25 U/L (ref 0–44)
AST: 57 U/L — ABNORMAL HIGH (ref 15–41)
Albumin: 3 g/dL — ABNORMAL LOW (ref 3.5–5.0)
Alkaline Phosphatase: 72 U/L (ref 38–126)
Anion gap: 10 (ref 5–15)
BUN: 28 mg/dL — ABNORMAL HIGH (ref 8–23)
CO2: 24 mmol/L (ref 22–32)
Calcium: 9.7 mg/dL (ref 8.9–10.3)
Chloride: 102 mmol/L (ref 98–111)
Creatinine, Ser: 1.19 mg/dL — ABNORMAL HIGH (ref 0.44–1.00)
GFR, Estimated: 45 mL/min — ABNORMAL LOW (ref >60)
Glucose, Bld: 109 mg/dL — ABNORMAL HIGH (ref 70–99)
Potassium: 4.7 mmol/L (ref 3.5–5.1)
Sodium: 136 mmol/L (ref 135–145)
Total Bilirubin: 0.4 mg/dL (ref 0.3–1.2)
Total Protein: 5.8 g/dL — ABNORMAL LOW (ref 6.5–8.1)

## 2022-08-01 LAB — CBC WITH DIFFERENTIAL/PLATELET
Abs Immature Granulocytes: 0.16 10*3/uL — ABNORMAL HIGH (ref 0.00–0.07)
Basophils Absolute: 0 10*3/uL (ref 0.0–0.1)
Basophils Relative: 0 %
Eosinophils Absolute: 0 10*3/uL (ref 0.0–0.5)
Eosinophils Relative: 0 %
HCT: 28.6 % — ABNORMAL LOW (ref 36.0–46.0)
Hemoglobin: 9.2 g/dL — ABNORMAL LOW (ref 12.0–15.0)
Immature Granulocytes: 1 %
Lymphocytes Relative: 3 %
Lymphs Abs: 0.3 10*3/uL — ABNORMAL LOW (ref 0.7–4.0)
MCH: 32.3 pg (ref 26.0–34.0)
MCHC: 32.2 g/dL (ref 30.0–36.0)
MCV: 100.4 fL — ABNORMAL HIGH (ref 80.0–100.0)
Monocytes Absolute: 0.7 10*3/uL (ref 0.1–1.0)
Monocytes Relative: 6 %
Neutro Abs: 11.1 10*3/uL — ABNORMAL HIGH (ref 1.7–7.7)
Neutrophils Relative %: 90 %
Platelets: 263 10*3/uL (ref 150–400)
RBC: 2.85 MIL/uL — ABNORMAL LOW (ref 3.87–5.11)
RDW: 15.5 % (ref 11.5–15.5)
WBC: 12.3 10*3/uL — ABNORMAL HIGH (ref 4.0–10.5)
nRBC: 0 % (ref 0.0–0.2)

## 2022-08-01 LAB — CULTURE, BLOOD (ROUTINE X 2)
Culture: NO GROWTH
Culture: NO GROWTH

## 2022-08-01 LAB — CBG MONITORING, ED: Glucose-Capillary: 115 mg/dL — ABNORMAL HIGH (ref 70–99)

## 2022-08-01 LAB — MAGNESIUM: Magnesium: 2.1 mg/dL (ref 1.7–2.4)

## 2022-08-01 LAB — URINALYSIS, ROUTINE W REFLEX MICROSCOPIC
Bacteria, UA: NONE SEEN
Bilirubin Urine: NEGATIVE
Glucose, UA: NEGATIVE mg/dL
Hgb urine dipstick: NEGATIVE
Ketones, ur: 5 mg/dL — AB
Nitrite: NEGATIVE
Protein, ur: 100 mg/dL — AB
Specific Gravity, Urine: 1.017 (ref 1.005–1.030)
pH: 5 (ref 5.0–8.0)

## 2022-08-01 NOTE — ED Triage Notes (Signed)
Patient BIB [facility] from home. Recent hospitalization for pneumonia. Family called out for syncope and altered mental status. Patient alert to self.

## 2022-08-01 NOTE — ED Provider Notes (Signed)
[facility] DEPT Provider Note   CSN: [id number] Arrival date & time: 08/01/22  1547     History  Chief Complaint  Patient presents with   Altered Mental Status    [name] [name] is a 89 y.o. female.  Patient is an 89 year old female with a past medical history of dementia, diabetes, dysphagia, hypothyroidism, CKD presenting to the emergency department with altered mental status and a syncopal episode.  Of note the patient was recently discharged from the hospital after treatment for aspiration pneumonia.  Per patient's daughter, she has been doing well since being home and woke up today acting normal.  She states that she ate breakfast and during breakfast appeared to have a vasovagal episode where her eyes glassed over and then returned to her normal self.  She states that she helped her get to the couch and initially called 911 but by the time ambulance arrived, she reported that she did not want to go to the ER.  Her daughter states that she never fell out of the chair or had any injury.  She states that she then had a friend over and they helped readjust her mom on the couch and she was talking to them normally and took a nap.  They state that upon wakening she was jumbling her words and not making sense and appeared to be drooling.  She states that she has not talked or return to her baseline since then.  The history is provided by the EMS personnel and a relative. The history is limited by the condition of the patient (Dementia).  Altered Mental Status      Home Medications Prior to Admission medications   Medication Sig Start Date End Date Taking? Authorizing Provider  acetaminophen (TYLENOL) 650 MG CR tablet Take 1,300 mg by mouth every 12 (twelve) hours.    [provider]  amoxicillin-clavulanate (AUGMENTIN) 875-125 MG tablet Take 1 tablet by mouth 2 (two) times daily for 3 days. 07/30/22 08/02/22  Shelly Coss, MD  B-D UF III MINI  PEN NEEDLES 31G X 5 MM MISC USE AS DIRECTED TID 02/14/19   [provider]  BAYER LOW DOSE 81 MG EC tablet Take 81 mg by mouth daily. Swallow whole.    [provider]  cetirizine (ZYRTEC) 10 MG tablet Take 5 mg by mouth daily as needed (itching).    [provider]  Cholecalciferol (VITAMIN D-3 PO) Take 1,000 Units by mouth daily.    [provider]  Famotidine (PEPCID AC PO) Take 1 tablet by mouth at bedtime.    [provider]  folic acid (FOLVITE) 1 MG tablet Take 1 mg by mouth at bedtime.    [provider]  insulin aspart (NOVOLOG FLEXPEN) 100 UNIT/ML FlexPen Inject 2-10 Units into the skin See admin instructions. Inject 2-10 units into the skin three times a day with meals, per sliding scale:  Breakfast: BGL 80-199 = 8 units; 200-299 = 9 units; 300 or greater = 10 units Lunch: BGL 80-199 = 5 units; 200-299 = 6 units; 300 or greater = 7 units Supper/evening meal: BGL 80-199 = 2 units; 200-299 = 3 units; 300 or greater = 4 units    [provider]  insulin detemir (LEVEMIR FLEXTOUCH) 100 UNIT/ML FlexPen Inject 3-7 Units into the skin See admin instructions. Inject 7 units into the skin in the morning and 3 units at bedtime if BGL is 200 or below and 4 units if BGL is greater  than 200    [provider]  Iron-FA-B Cmp-C-Biot-Probiotic (FUSION PLUS) CAPS Take 1 capsule by mouth daily with breakfast. 03/02/19   [provider]  levETIRAcetam (KEPPRA) 500 MG tablet Take 1 tablet (500 mg total) by mouth 2 (two) times daily. 10/11/19   Donzetta Starch, NP  liver oil-zinc oxide (DESITIN) 40 % ointment Apply topically 3 (three) times daily. 09/16/21   Lavina Hamman, MD  loperamide (IMODIUM A-D) 2 MG tablet Take 2 mg by mouth 4 (four) times daily as needed for diarrhea or loose stools.    [provider]  methotrexate (RHEUMATREX) 2.5 MG tablet Take 1 tablet (2.5 mg total) by mouth once a week. Caution:Chemotherapy.  Protect from light. Patient taking differently: Take 15 mg by mouth every Saturday. Caution:Chemotherapy. Protect from light. 01/27/22   Charlynne Cousins, MD  metoprolol tartrate (LOPRESSOR) 25 MG tablet Take 1 tablet (25 mg total) by mouth 2 (two) times daily. 01/20/22   Charlynne Cousins, MD  midodrine (PROAMATINE) 5 MG tablet Take 1 tablet (5 mg total) by mouth 3 (three) times daily with meals. Patient taking differently: Take 5 mg by mouth 2 (two) times daily with a meal. 05/13/22   Cherene Altes, MD  Omega 3 1000 MG CAPS Take 1,000 mg by mouth daily.    [provider]  OneTouch Delica Lancets 35H MISC IC-10 CODE:  E11.65  Check blood sugar    [provider]  ONETOUCH VERIO test strip 1 each 3 (three) times daily. 07/07/20   [provider]  polyethylene glycol (MIRALAX / GLYCOLAX) 17 g packet Take 17 g by mouth daily. Patient taking differently: Take 17 g by mouth daily as needed for mild constipation. 09/16/21   Lavina Hamman, MD  potassium chloride (KLOR-CON) 20 MEQ packet Take 40 mEq by mouth daily for 3 days. 07/30/22 08/02/22  Shelly Coss, MD  predniSONE (DELTASONE) 5 MG tablet Take 5 mg by mouth daily with breakfast.    [provider]  SYNTHROID 200 MCG tablet Take 200 mcg by mouth daily before breakfast. 04/23/21   [provider]  triamcinolone ointment (KENALOG) 0.1 % Apply 1 Application topically 2 (two) times daily as needed (itching, rash). 07/16/22   [provider]      Allergies    Nsaids, Biaxin [clarithromycin], Bactrim [sulfamethoxazole-trimethoprim], Lisinopril, and Sulfa antibiotics    Review of Systems   Review of Systems  Physical Exam Updated Vital Signs BP (!) 161/52   Pulse 61   Temp 97.7 F (36.5 C) (Oral)   Resp 13   SpO2 100%  Physical Exam Vitals and nursing note reviewed.  Constitutional:      General: She is not in acute distress.    Appearance: Normal appearance.  HENT:      Head: Normocephalic and atraumatic.     Nose: Nose normal.     Mouth/Throat:     Mouth: Mucous membranes are moist.     Pharynx: Oropharynx is clear.  Eyes:     Extraocular Movements: Extraocular movements intact.     Conjunctiva/sclera: Conjunctivae normal.  Cardiovascular:     Rate and Rhythm: Normal rate and regular rhythm.     Pulses: Normal pulses.     Heart sounds: Normal heart sounds.  Pulmonary:     Effort: Pulmonary effort is normal.     Breath sounds: Normal breath sounds.  Abdominal:     General: Abdomen is flat.     Palpations: Abdomen  is soft.     Tenderness: There is no abdominal tenderness.  Musculoskeletal:        General: No swelling or deformity. Normal range of motion.     Cervical back: Normal range of motion and neck supple.     Right lower leg: No edema.     Left lower leg: No edema.  Skin:    General: Skin is warm and dry.  Neurological:     General: No focal deficit present.     Mental Status: She is alert.     Comments: Oriented x 0, following commands but not answering questions     ED Results / Procedures / Treatments   Labs (all labs ordered are listed, but only abnormal results are displayed) Labs Reviewed  CBC WITH DIFFERENTIAL/PLATELET - Abnormal; Notable for the following components:      Result Value   WBC 12.3 (*)    RBC 2.85 (*)    Hemoglobin 9.2 (*)    HCT 28.6 (*)    MCV 100.4 (*)    Neutro Abs 11.1 (*)    Lymphs Abs 0.3 (*)    Abs Immature Granulocytes 0.16 (*)    All other components within normal limits  URINALYSIS, ROUTINE W REFLEX MICROSCOPIC - Abnormal; Notable for the following components:   Color, Urine AMBER (*)    APPearance HAZY (*)    Ketones, ur 5 (*)    Protein, ur 100 (*)    Leukocytes,Ua MODERATE (*)    All other components within normal limits  COMPREHENSIVE METABOLIC PANEL - Abnormal; Notable for the following components:   Glucose, Bld 109 (*)    BUN 28 (*)    Creatinine, Ser 1.19 (*)    Total Protein  5.8 (*)    Albumin 3.0 (*)    AST 57 (*)    GFR, Estimated 45 (*)    All other components within normal limits  CBG MONITORING, ED - Abnormal; Notable for the following components:   Glucose-Capillary 115 (*)    All other components within normal limits  MAGNESIUM    EKG EKG Interpretation  Date/Time:  Saturday August 01 2022 16:05:21 EST Ventricular Rate:  65 PR Interval:  192 QRS Duration: 90 QT Interval:  434 QTC Calculation: 452 R Axis:   41 Text Interpretation: Sinus rhythm Probable left atrial enlargement No significant change since last tracing Confirmed by Leanord Asal ([id number]) on 08/01/2022 4:24:48 PM  Radiology CT Head Wo Contrast  Result Date: 08/01/2022 CLINICAL DATA:  Altered mental status EXAM: CT HEAD WITHOUT CONTRAST TECHNIQUE: Contiguous axial images were obtained from the base of the skull through the vertex without intravenous contrast. RADIATION DOSE REDUCTION: This exam was performed according to the departmental dose-optimization program which includes automated exposure control, adjustment of the mA and/or kV according to patient size and/or use of iterative reconstruction technique. COMPARISON:  07/27/2022 FINDINGS: Brain: No evidence of acute infarction, hemorrhage, hydrocephalus, extra-axial collection or mass lesion/mass effect. Scattered low-density changes within the periventricular and subcortical white matter compatible with chronic microvascular ischemic change. Mild diffuse cerebral volume loss with stable size and configuration of the ventricles. Vascular: Atherosclerotic calcifications involving the large vessels of the skull base. No unexpected hyperdense vessel. Skull: Normal. Negative for fracture or focal lesion. Sinuses/Orbits: No acute finding. Other: None. IMPRESSION: 1. No acute intracranial abnormality. 2. Chronic microvascular ischemic change and cerebral volume loss. Electronically Signed   By: Davina Poke D.O.   On: 08/01/2022 17:37    DG  Chest 1 View  Result Date: 08/01/2022 CLINICAL DATA:  Pneumonia, syncope EXAM: CHEST  1 VIEW COMPARISON:  07/27/2022 FINDINGS: Single frontal view of the chest demonstrates a stable cardiac silhouette. No airspace disease, effusion, or pneumothorax. No acute bony abnormality. IMPRESSION: 1. Stable chest, no acute process. Electronically Signed   By: Randa Ngo M.D.   On: 08/01/2022 17:17    Procedures Procedures    Medications Ordered in ED Medications - No data to display  ED Course/ Medical Decision Making/ A&P Clinical Course as of 08/01/22 1943  Sat Aug 01, 2022  1858 Moderate leuks but no bacteria or nitrites in UA making UTI less likely. [VK]  1940 Upon reassessment, the patient is awake and alert and well-appearing and answering yes and no questions in no acute distress.  Her workup showed a mild leukocytosis otherwise no source of infection and head CT was negative.  Spoke with her daughter who is comfortable with her discharge home with close primary care follow-up and she was given strict return precautions. [VK]    Clinical Course User Index [VK] Kemper Durie, DO                             Medical Decision Making This patient presents to the ED with chief complaint(s) of AMS, syncope with pertinent past medical history of dementia, hypothyroidism, DM, CKD, dysphagia, recent treatment of aspiration pneumonia which further complicates the presenting complaint. The complaint involves an extensive differential diagnosis and also carries with it a high risk of complications and morbidity.    The differential diagnosis includes CVA, ICH, mass effect, dehydration, electrolyte abnormality, aspiration, infection  Additional history obtained: Additional history obtained from family Records reviewed previous admission documents  ED Course and Reassessment: Upon patient's arrival to the emergency department she is awake and alert, following commands in all 4  extremities but is nonconversant on exam.  She will have labs and urine, chest x-ray and head CT performed to evaluate for cause of her altered mental status and will be closely reassessed.  Independent labs interpretation:  The following labs were independently interpreted: Within normal range  Independent visualization of imaging: - I independently visualized the following imaging with scope of interpretation limited to determining acute life threatening conditions related to emergency care: Chest x-ray, CT head, which revealed no acute disease  Consultation: - Consulted or discussed management/test interpretation w/ external professional: N/A  Consideration for admission or further workup: Patient has no emergent conditions requiring admission or further work-up at this time and is stable for discharge home with primary care follow-up  Social Determinants of health: N/A    Amount and/or Complexity of Data Reviewed Labs: ordered. Radiology: ordered.          Final Clinical Impression(s) / ED Diagnoses Final diagnoses:  Transient alteration of awareness    Rx / DC Orders ED Discharge Orders     None         Kemper Durie, DO 08/01/22 1943

## 2022-08-01 NOTE — Discharge Instructions (Signed)
You were seen in the emergency department for your confusion and near passing out episode.  Is unclear what caused your symptoms today but you had no signs of worsening infection, dehydration, stroke or abnormal electrolytes.  You should follow-up with your primary doctor in the next few days to have your symptoms rechecked.  You should return to the emergency department if you are having increasing drowsiness and hard to wake up, you are having fevers, you are not eating or drinking anything or if you have any other new or concerning symptoms.

## 2022-08-07 DIAGNOSIS — L97522 Non-pressure chronic ulcer of other part of left foot with fat layer exposed: Secondary | ICD-10-CM | POA: Diagnosis not present

## 2022-08-12 DIAGNOSIS — R1319 Other dysphagia: Secondary | ICD-10-CM | POA: Diagnosis not present

## 2022-08-12 DIAGNOSIS — N1832 Chronic kidney disease, stage 3b: Secondary | ICD-10-CM | POA: Diagnosis not present

## 2022-08-12 DIAGNOSIS — E1142 Type 2 diabetes mellitus with diabetic polyneuropathy: Secondary | ICD-10-CM | POA: Diagnosis not present

## 2022-08-12 DIAGNOSIS — G40909 Epilepsy, unspecified, not intractable, without status epilepticus: Secondary | ICD-10-CM | POA: Diagnosis not present

## 2022-08-12 DIAGNOSIS — I700 Atherosclerosis of aorta: Secondary | ICD-10-CM | POA: Diagnosis not present

## 2022-08-12 DIAGNOSIS — E11621 Type 2 diabetes mellitus with foot ulcer: Secondary | ICD-10-CM | POA: Diagnosis not present

## 2022-08-12 DIAGNOSIS — M3390 Dermatopolymyositis, unspecified, organ involvement unspecified: Secondary | ICD-10-CM | POA: Diagnosis not present

## 2022-08-12 DIAGNOSIS — L89159 Pressure ulcer of sacral region, unspecified stage: Secondary | ICD-10-CM | POA: Diagnosis not present

## 2022-08-20 DIAGNOSIS — H6123 Impacted cerumen, bilateral: Secondary | ICD-10-CM | POA: Diagnosis not present

## 2022-08-25 DIAGNOSIS — N390 Urinary tract infection, site not specified: Secondary | ICD-10-CM | POA: Diagnosis not present

## 2022-08-25 DIAGNOSIS — G40909 Epilepsy, unspecified, not intractable, without status epilepticus: Secondary | ICD-10-CM | POA: Diagnosis not present

## 2022-08-25 DIAGNOSIS — E1151 Type 2 diabetes mellitus with diabetic peripheral angiopathy without gangrene: Secondary | ICD-10-CM | POA: Diagnosis not present

## 2022-08-25 DIAGNOSIS — L97528 Non-pressure chronic ulcer of other part of left foot with other specified severity: Secondary | ICD-10-CM | POA: Diagnosis not present

## 2022-08-25 DIAGNOSIS — L89152 Pressure ulcer of sacral region, stage 2: Secondary | ICD-10-CM | POA: Diagnosis not present

## 2022-08-25 DIAGNOSIS — R1312 Dysphagia, oropharyngeal phase: Secondary | ICD-10-CM | POA: Diagnosis not present

## 2022-08-25 DIAGNOSIS — F0390 Unspecified dementia without behavioral disturbance: Secondary | ICD-10-CM | POA: Diagnosis not present

## 2022-08-25 DIAGNOSIS — D631 Anemia in chronic kidney disease: Secondary | ICD-10-CM | POA: Diagnosis not present

## 2022-08-25 DIAGNOSIS — L97518 Non-pressure chronic ulcer of other part of right foot with other specified severity: Secondary | ICD-10-CM | POA: Diagnosis not present

## 2022-08-25 DIAGNOSIS — E039 Hypothyroidism, unspecified: Secondary | ICD-10-CM | POA: Diagnosis not present

## 2022-08-25 DIAGNOSIS — G9341 Metabolic encephalopathy: Secondary | ICD-10-CM | POA: Diagnosis not present

## 2022-08-25 DIAGNOSIS — I152 Hypertension secondary to endocrine disorders: Secondary | ICD-10-CM | POA: Diagnosis not present

## 2022-08-25 DIAGNOSIS — E1122 Type 2 diabetes mellitus with diabetic chronic kidney disease: Secondary | ICD-10-CM | POA: Diagnosis not present

## 2022-08-25 DIAGNOSIS — Z7982 Long term (current) use of aspirin: Secondary | ICD-10-CM | POA: Diagnosis not present

## 2022-08-25 DIAGNOSIS — L89896 Pressure-induced deep tissue damage of other site: Secondary | ICD-10-CM | POA: Diagnosis not present

## 2022-08-25 DIAGNOSIS — A419 Sepsis, unspecified organism: Secondary | ICD-10-CM | POA: Diagnosis not present

## 2022-08-25 DIAGNOSIS — R69 Illness, unspecified: Secondary | ICD-10-CM | POA: Diagnosis not present

## 2022-08-25 DIAGNOSIS — E1159 Type 2 diabetes mellitus with other circulatory complications: Secondary | ICD-10-CM | POA: Diagnosis not present

## 2022-08-25 DIAGNOSIS — Z515 Encounter for palliative care: Secondary | ICD-10-CM | POA: Diagnosis not present

## 2022-08-25 DIAGNOSIS — N179 Acute kidney failure, unspecified: Secondary | ICD-10-CM | POA: Diagnosis not present

## 2022-08-25 DIAGNOSIS — E11621 Type 2 diabetes mellitus with foot ulcer: Secondary | ICD-10-CM | POA: Diagnosis not present

## 2022-08-25 DIAGNOSIS — J690 Pneumonitis due to inhalation of food and vomit: Secondary | ICD-10-CM | POA: Diagnosis not present

## 2022-08-25 DIAGNOSIS — E440 Moderate protein-calorie malnutrition: Secondary | ICD-10-CM | POA: Diagnosis not present

## 2022-08-25 DIAGNOSIS — Z79891 Long term (current) use of opiate analgesic: Secondary | ICD-10-CM | POA: Diagnosis not present

## 2022-08-25 DIAGNOSIS — E11649 Type 2 diabetes mellitus with hypoglycemia without coma: Secondary | ICD-10-CM | POA: Diagnosis not present

## 2022-08-25 DIAGNOSIS — Z794 Long term (current) use of insulin: Secondary | ICD-10-CM | POA: Diagnosis not present

## 2022-08-25 DIAGNOSIS — N182 Chronic kidney disease, stage 2 (mild): Secondary | ICD-10-CM | POA: Diagnosis not present

## 2022-08-28 DIAGNOSIS — L97522 Non-pressure chronic ulcer of other part of left foot with fat layer exposed: Secondary | ICD-10-CM | POA: Diagnosis not present

## 2022-09-01 DIAGNOSIS — D631 Anemia in chronic kidney disease: Secondary | ICD-10-CM | POA: Diagnosis not present

## 2022-09-01 DIAGNOSIS — J690 Pneumonitis due to inhalation of food and vomit: Secondary | ICD-10-CM | POA: Diagnosis not present

## 2022-09-01 DIAGNOSIS — Z794 Long term (current) use of insulin: Secondary | ICD-10-CM | POA: Diagnosis not present

## 2022-09-01 DIAGNOSIS — I152 Hypertension secondary to endocrine disorders: Secondary | ICD-10-CM | POA: Diagnosis not present

## 2022-09-01 DIAGNOSIS — E1122 Type 2 diabetes mellitus with diabetic chronic kidney disease: Secondary | ICD-10-CM | POA: Diagnosis not present

## 2022-09-01 DIAGNOSIS — N179 Acute kidney failure, unspecified: Secondary | ICD-10-CM | POA: Diagnosis not present

## 2022-09-01 DIAGNOSIS — E1159 Type 2 diabetes mellitus with other circulatory complications: Secondary | ICD-10-CM | POA: Diagnosis not present

## 2022-09-01 DIAGNOSIS — R69 Illness, unspecified: Secondary | ICD-10-CM | POA: Diagnosis not present

## 2022-09-01 DIAGNOSIS — Z79891 Long term (current) use of opiate analgesic: Secondary | ICD-10-CM | POA: Diagnosis not present

## 2022-09-01 DIAGNOSIS — N182 Chronic kidney disease, stage 2 (mild): Secondary | ICD-10-CM | POA: Diagnosis not present

## 2022-09-01 DIAGNOSIS — E1151 Type 2 diabetes mellitus with diabetic peripheral angiopathy without gangrene: Secondary | ICD-10-CM | POA: Diagnosis not present

## 2022-09-01 DIAGNOSIS — L89152 Pressure ulcer of sacral region, stage 2: Secondary | ICD-10-CM | POA: Diagnosis not present

## 2022-09-01 DIAGNOSIS — E039 Hypothyroidism, unspecified: Secondary | ICD-10-CM | POA: Diagnosis not present

## 2022-09-01 DIAGNOSIS — E440 Moderate protein-calorie malnutrition: Secondary | ICD-10-CM | POA: Diagnosis not present

## 2022-09-01 DIAGNOSIS — Z7982 Long term (current) use of aspirin: Secondary | ICD-10-CM | POA: Diagnosis not present

## 2022-09-01 DIAGNOSIS — G9341 Metabolic encephalopathy: Secondary | ICD-10-CM | POA: Diagnosis not present

## 2022-09-01 DIAGNOSIS — E11649 Type 2 diabetes mellitus with hypoglycemia without coma: Secondary | ICD-10-CM | POA: Diagnosis not present

## 2022-09-01 DIAGNOSIS — G40909 Epilepsy, unspecified, not intractable, without status epilepticus: Secondary | ICD-10-CM | POA: Diagnosis not present

## 2022-09-03 ENCOUNTER — Other Ambulatory Visit: Payer: Self-pay

## 2022-09-03 ENCOUNTER — Emergency Department (HOSPITAL_COMMUNITY)
Admission: EM | Admit: 2022-09-03 | Discharge: 2022-09-03 | Disposition: A | Discharge status: Home / Self Care | Payer: Medicare HMO | Attending: Emergency Medicine | Admitting: Emergency Medicine

## 2022-09-03 ENCOUNTER — Encounter (HOSPITAL_COMMUNITY): Payer: Self-pay

## 2022-09-03 ENCOUNTER — Emergency Department (HOSPITAL_COMMUNITY): Payer: Medicare HMO

## 2022-09-03 DIAGNOSIS — Z794 Long term (current) use of insulin: Secondary | ICD-10-CM | POA: Diagnosis not present

## 2022-09-03 DIAGNOSIS — E1122 Type 2 diabetes mellitus with diabetic chronic kidney disease: Secondary | ICD-10-CM | POA: Insufficient documentation

## 2022-09-03 DIAGNOSIS — R7989 Other specified abnormal findings of blood chemistry: Secondary | ICD-10-CM | POA: Diagnosis not present

## 2022-09-03 DIAGNOSIS — R55 Syncope and collapse: Secondary | ICD-10-CM | POA: Insufficient documentation

## 2022-09-03 DIAGNOSIS — Z79899 Other long term (current) drug therapy: Secondary | ICD-10-CM | POA: Diagnosis not present

## 2022-09-03 DIAGNOSIS — N3000 Acute cystitis without hematuria: Secondary | ICD-10-CM | POA: Insufficient documentation

## 2022-09-03 DIAGNOSIS — Z1152 Encounter for screening for COVID-19: Secondary | ICD-10-CM | POA: Diagnosis not present

## 2022-09-03 DIAGNOSIS — E039 Hypothyroidism, unspecified: Secondary | ICD-10-CM | POA: Insufficient documentation

## 2022-09-03 DIAGNOSIS — D649 Anemia, unspecified: Secondary | ICD-10-CM | POA: Diagnosis not present

## 2022-09-03 DIAGNOSIS — R0602 Shortness of breath: Secondary | ICD-10-CM | POA: Insufficient documentation

## 2022-09-03 DIAGNOSIS — F0390 Unspecified dementia without behavioral disturbance: Secondary | ICD-10-CM | POA: Diagnosis not present

## 2022-09-03 DIAGNOSIS — Z743 Need for continuous supervision: Secondary | ICD-10-CM | POA: Diagnosis not present

## 2022-09-03 DIAGNOSIS — R0902 Hypoxemia: Secondary | ICD-10-CM | POA: Diagnosis not present

## 2022-09-03 DIAGNOSIS — R739 Hyperglycemia, unspecified: Secondary | ICD-10-CM | POA: Diagnosis not present

## 2022-09-03 DIAGNOSIS — R231 Pallor: Secondary | ICD-10-CM | POA: Diagnosis not present

## 2022-09-03 DIAGNOSIS — N189 Chronic kidney disease, unspecified: Secondary | ICD-10-CM | POA: Insufficient documentation

## 2022-09-03 DIAGNOSIS — R69 Illness, unspecified: Secondary | ICD-10-CM | POA: Diagnosis not present

## 2022-09-03 LAB — COMPREHENSIVE METABOLIC PANEL
ALT: 16 U/L (ref 0–44)
AST: 28 U/L (ref 15–41)
Albumin: 3 g/dL — ABNORMAL LOW (ref 3.5–5.0)
Alkaline Phosphatase: 72 U/L (ref 38–126)
Anion gap: 17 — ABNORMAL HIGH (ref 5–15)
BUN: 16 mg/dL (ref 8–23)
CO2: 23 mmol/L (ref 22–32)
Calcium: 9.5 mg/dL (ref 8.9–10.3)
Chloride: 97 mmol/L — ABNORMAL LOW (ref 98–111)
Creatinine, Ser: 0.94 mg/dL (ref 0.44–1.00)
GFR, Estimated: 59 mL/min — ABNORMAL LOW (ref >60)
Glucose, Bld: 228 mg/dL — ABNORMAL HIGH (ref 70–99)
Potassium: 4 mmol/L (ref 3.5–5.1)
Sodium: 137 mmol/L (ref 135–145)
Total Bilirubin: 0.8 mg/dL (ref 0.3–1.2)
Total Protein: 6.1 g/dL — ABNORMAL LOW (ref 6.5–8.1)

## 2022-09-03 LAB — URINALYSIS, ROUTINE W REFLEX MICROSCOPIC
Bilirubin Urine: NEGATIVE
Glucose, UA: 150 mg/dL — AB
Hgb urine dipstick: NEGATIVE
Ketones, ur: 5 mg/dL — AB
Nitrite: NEGATIVE
Protein, ur: 30 mg/dL — AB
Specific Gravity, Urine: 1.01 (ref 1.005–1.030)
pH: 6 (ref 5.0–8.0)

## 2022-09-03 LAB — MAGNESIUM: Magnesium: 1.6 mg/dL — ABNORMAL LOW (ref 1.7–2.4)

## 2022-09-03 LAB — RESP PANEL BY RT-PCR (RSV, FLU A&B, COVID)  RVPGX2
Influenza A by PCR: NEGATIVE
Influenza B by PCR: NEGATIVE
Resp Syncytial Virus by PCR: NEGATIVE
SARS Coronavirus 2 by RT PCR: NEGATIVE

## 2022-09-03 LAB — CBC WITH DIFFERENTIAL/PLATELET
Abs Immature Granulocytes: 0.1 10*3/uL — ABNORMAL HIGH (ref 0.00–0.07)
Basophils Absolute: 0 10*3/uL (ref 0.0–0.1)
Basophils Relative: 1 %
Eosinophils Absolute: 0.5 10*3/uL (ref 0.0–0.5)
Eosinophils Relative: 7 %
HCT: 32.2 % — ABNORMAL LOW (ref 36.0–46.0)
Hemoglobin: 10.1 g/dL — ABNORMAL LOW (ref 12.0–15.0)
Immature Granulocytes: 1 %
Lymphocytes Relative: 9 %
Lymphs Abs: 0.7 10*3/uL (ref 0.7–4.0)
MCH: 31.6 pg (ref 26.0–34.0)
MCHC: 31.4 g/dL (ref 30.0–36.0)
MCV: 100.6 fL — ABNORMAL HIGH (ref 80.0–100.0)
Monocytes Absolute: 0.8 10*3/uL (ref 0.1–1.0)
Monocytes Relative: 11 %
Neutro Abs: 5.2 10*3/uL (ref 1.7–7.7)
Neutrophils Relative %: 71 %
Platelets: 301 10*3/uL (ref 150–400)
RBC: 3.2 MIL/uL — ABNORMAL LOW (ref 3.87–5.11)
RDW: 14.6 % (ref 11.5–15.5)
WBC: 7.3 10*3/uL (ref 4.0–10.5)
nRBC: 0 % (ref 0.0–0.2)

## 2022-09-03 LAB — T4, FREE: Free T4: 2.06 ng/dL — ABNORMAL HIGH (ref 0.61–1.12)

## 2022-09-03 LAB — TROPONIN I (HIGH SENSITIVITY)
Troponin I (High Sensitivity): 12 ng/L (ref <18)
Troponin I (High Sensitivity): 14 ng/L (ref <18)

## 2022-09-03 LAB — CBG MONITORING, ED: Glucose-Capillary: 232 mg/dL — ABNORMAL HIGH (ref 70–99)

## 2022-09-03 LAB — BRAIN NATRIURETIC PEPTIDE: B Natriuretic Peptide: 123 pg/mL — ABNORMAL HIGH (ref 0.0–100.0)

## 2022-09-03 LAB — TSH: TSH: 0.125 u[IU]/mL — ABNORMAL LOW (ref 0.350–4.500)

## 2022-09-03 MED ORDER — LACTATED RINGERS IV BOLUS
1000.0000 mL | Freq: Once | INTRAVENOUS | Status: AC
  Administered 2022-09-03: 1000 mL via INTRAVENOUS

## 2022-09-03 MED ORDER — CEPHALEXIN 500 MG PO CAPS: 500.0000 mg | ORAL_CAPSULE | Freq: Four times a day (QID) | ORAL | 0 refills | Status: DC

## 2022-09-03 MED ORDER — CEPHALEXIN 500 MG PO CAPS
1000.0000 mg | ORAL_CAPSULE | Freq: Once | ORAL | Status: AC
  Administered 2022-09-03: 1000 mg via ORAL
  Filled 2022-09-03: qty 2

## 2022-09-03 NOTE — ED Notes (Signed)
DG CHEST PORTABLE ! VIEW WAS ACCIDENTALLY CLICKED OFF

## 2022-09-03 NOTE — ED Notes (Signed)
Pt provided discharge instructions and prescription information. Pt was given the opportunity to ask questions and questions were answered.   

## 2022-09-03 NOTE — ED Triage Notes (Addendum)
Pt BIB [facility] coming from home. Pt had a witnessed syncopal episode and was assisted to the ground. Pt has a hx of same due to electrolytes abnormality. EMS admin 584m of NaCl. EMS report CBG of 292. Pt is A/Ox4 on arrival.

## 2022-09-03 NOTE — ED Notes (Signed)
Loop recorder interrogated at this time. Awaiting report form [employer].

## 2022-09-03 NOTE — ED Provider Notes (Signed)
[facility] Provider Note   CSN: [id number] Arrival date & time: 09/03/22  1145     History  Chief Complaint  Patient presents with   Loss of Consciousness    [name] [name] is a 89 y.o. female.  HPI   This is an 89 year old female with medical history of dementia, diabetes on insulin, dermatomycosis, hypothyroid, CKD presenting to the emergency department due to syncopal episode.  Patient has been seen in the ED multiple times in the last year for syncopal episodes.  States just yesterday she was tired and would not get out of bed, daughter reports decreased oral intake.  This morning patient awoke, was taking her breakfast medications but felt lightheaded and presyncopal.  Laid down on the floor, got back up and had a syncopal episode which was associated afterwards with emesis.  Patient denied any prodromal chest pain or shortness of breath, there is no lateralized weakness or tonic-clonic activity.  Patient's daughter states that she has not had any changes in medication recently.    History provided by the patient but she has dementia so level 5 caveat applies.  Also spoke with patient's daughter Estill Batten as well as EMS reports independent history.   Home Medications Prior to Admission medications   Medication Sig Start Date End Date Taking? Authorizing Provider  acetaminophen (TYLENOL) 650 MG CR tablet Take 650 mg by mouth every 8 (eight) hours as needed for pain.   Yes [provider]  BAYER LOW DOSE 81 MG EC tablet Take 81 mg by mouth daily. Swallow whole.   Yes [provider]  cephALEXin (KEFLEX) 500 MG capsule Take 1 capsule (500 mg total) by mouth 4 (four) times daily. 09/03/22  Yes Sherrill Raring, PA-C  cetirizine (ZYRTEC) 10 MG tablet Take 10 mg by mouth daily as needed (itching).   Yes [provider]  Cholecalciferol (VITAMIN D-3 PO) Take 1,000 Units by mouth daily.   Yes [provider]   Famotidine (PEPCID AC PO) Take 20 mg by mouth at bedtime.   Yes [provider]  folic acid (FOLVITE) 1 MG tablet Take 1 mg by mouth at bedtime.   Yes [provider]  insulin aspart (NOVOLOG FLEXPEN) 100 UNIT/ML FlexPen Inject 2-10 Units into the skin See admin instructions. Inject 2-10 units into the skin three times a day with meals, per sliding scale:  Breakfast: BGL 80-199 = 8 units; 200-299 = 9 units; 300 or greater = 10 units Lunch: BGL 80-199 = 5 units; 200-299 = 6 units; 300 or greater = 7 units Supper/evening meal: BGL 80-199 = 2 units; 200-299 = 3 units; 300 or greater = 4 units   Yes [provider]  insulin detemir (LEVEMIR FLEXTOUCH) 100 UNIT/ML FlexPen Inject 3-7 Units into the skin See admin instructions. Inject 7 units into the skin in the morning and 3 units at bedtime if BGL is 200 or below and 4 units if BGL is greater than 200   Yes [provider]  levETIRAcetam (KEPPRA) 500 MG tablet Take 1 tablet (500 mg total) by mouth 2 (two) times daily. 10/11/19  Yes Donzetta Starch, NP  liver oil-zinc oxide (DESITIN) 40 % ointment Apply topically 3 (three) times daily. Patient taking differently: Apply 1 Application topically as needed for irritation. 09/16/21  Yes Lavina Hamman, MD  loperamide (IMODIUM A-D) 2 MG tablet Take 2 mg by mouth 4 (four) times daily as needed for diarrhea or loose  stools.   Yes [provider]  methotrexate (RHEUMATREX) 2.5 MG tablet Take 1 tablet (2.5 mg total) by mouth once a week. Caution:Chemotherapy. Protect from light. Patient taking differently: Take 15 mg by mouth every Saturday. Caution:Chemotherapy. Protect from light. 01/27/22  Yes Charlynne Cousins, MD  metoprolol tartrate (LOPRESSOR) 25 MG tablet Take 1 tablet (25 mg total) by mouth 2 (two) times daily. 01/20/22  Yes Charlynne Cousins, MD  midodrine (PROAMATINE) 5 MG tablet Take 1 tablet (5 mg total) by mouth 3 (three) times daily with meals. Patient  taking differently: Take 5 mg by mouth 2 (two) times daily with a meal. 05/13/22  Yes Cherene Altes, MD  polyethylene glycol (MIRALAX / GLYCOLAX) 17 g packet Take 17 g by mouth daily. Patient taking differently: Take 17 g by mouth daily as needed for mild constipation. 09/16/21  Yes Lavina Hamman, MD  predniSONE (DELTASONE) 5 MG tablet Take 5 mg by mouth daily with breakfast.   Yes [provider]  SYNTHROID 200 MCG tablet Take 200 mcg by mouth daily before breakfast. 04/23/21  Yes [provider]  triamcinolone ointment (KENALOG) 0.1 % Apply 1 Application topically 2 (two) times daily as needed (itching, rash). 07/16/22  Yes [provider]      Allergies    Nsaids, Biaxin [clarithromycin], Bactrim [sulfamethoxazole-trimethoprim], Lisinopril, and Sulfa antibiotics    Review of Systems   Review of Systems  Physical Exam Updated Vital Signs BP (!) 161/75   Pulse 61   Temp (!) 97 F (36.1 C)   Resp 11   Ht 5' 6"$  (1.676 m)   Wt 49.9 kg   SpO2 99%   BMI 17.75 kg/m  Physical Exam Vitals and nursing note reviewed. Exam conducted with a chaperone present.  Constitutional:      Appearance: Normal appearance.     Comments: frail  HENT:     Head: Normocephalic and atraumatic.     Mouth/Throat:     Comments: Edentulous.  Oral thrush Eyes:     General: No scleral icterus.       Right eye: No discharge.        Left eye: No discharge.     Extraocular Movements: Extraocular movements intact.     Pupils: Pupils are equal, round, and reactive to light.  Cardiovascular:     Rate and Rhythm: Normal rate and regular rhythm.     Pulses: Normal pulses.     Heart sounds: Normal heart sounds.     No friction rub. No gallop.     Comments: Upper and lower extremity pulses are symmetric bilaterally Pulmonary:     Effort: Pulmonary effort is normal. No respiratory distress.     Comments: Globally diminished but not tachypneic, no rales or rhonchi Abdominal:      General: Abdomen is flat. Bowel sounds are normal. There is no distension.     Palpations: Abdomen is soft.     Tenderness: There is no abdominal tenderness.  Skin:    General: Skin is warm and dry.     Coloration: Skin is not jaundiced.  Neurological:     Mental Status: She is alert. Mental status is at baseline.     Coordination: Coordination normal.     Comments: Pleasantly demented, oriented to self and place     ED Results / Procedures / Treatments   Labs (all labs ordered are listed, but only abnormal results are displayed) Labs Reviewed  URINALYSIS, ROUTINE W REFLEX MICROSCOPIC -  Abnormal; Notable for the following components:      Result Value   Glucose, UA 150 (*)    Ketones, ur 5 (*)    Protein, ur 30 (*)    Leukocytes,Ua MODERATE (*)    Bacteria, UA RARE (*)    All other components within normal limits  CBC WITH DIFFERENTIAL/PLATELET - Abnormal; Notable for the following components:   RBC 3.20 (*)    Hemoglobin 10.1 (*)    HCT 32.2 (*)    MCV 100.6 (*)    Abs Immature Granulocytes 0.10 (*)    All other components within normal limits  COMPREHENSIVE METABOLIC PANEL - Abnormal; Notable for the following components:   Chloride 97 (*)    Glucose, Bld 228 (*)    Total Protein 6.1 (*)    Albumin 3.0 (*)    GFR, Estimated 59 (*)    Anion gap 17 (*)    All other components within normal limits  MAGNESIUM - Abnormal; Notable for the following components:   Magnesium 1.6 (*)    All other components within normal limits  TSH - Abnormal; Notable for the following components:   TSH 0.125 (*)    All other components within normal limits  BRAIN NATRIURETIC PEPTIDE - Abnormal; Notable for the following components:   B Natriuretic Peptide 123.0 (*)    All other components within normal limits  CBG MONITORING, ED - Abnormal; Notable for the following components:   Glucose-Capillary 232 (*)    All other components within normal limits  RESP PANEL BY RT-PCR (RSV, FLU A&B,  COVID)  RVPGX2  T4, FREE  CBG MONITORING, ED  TROPONIN I (HIGH SENSITIVITY)  TROPONIN I (HIGH SENSITIVITY)    EKG None  Radiology DG Chest Portable 1 View  Result Date: 09/03/2022 CLINICAL DATA:  Shortness of breath. EXAM: PORTABLE CHEST 1 VIEW COMPARISON:  August 01, 2022. FINDINGS: The heart size and mediastinal contours are within normal limits. Both lungs are clear. The visualized skeletal structures are unremarkable. IMPRESSION: No active disease. Electronically Signed   By: Marijo Conception M.D.   On: 09/03/2022 12:27    Procedures Procedures    Medications Ordered in ED Medications  lactated ringers bolus 1,000 mL ( Intravenous Stopped 09/03/22 1347)    ED Course/ Medical Decision Making/ A&P Clinical Course as of 09/03/22 1629  Thu Sep 03, 2022  1423 Urinalysis, Routine w reflex microscopic -Urine, Clean Catch(!) UTI [HS]  1423 CBC with Differential(!) Anemia but actually improved compared to baseline, no leukocytosis [HS]  1423 Resp panel by RT-PCR (RSV, Flu A&B, Covid) Anterior Nasal Swab Negative for COVID flu [HS]  1423 Comprehensive metabolic panel(!) No gross electrolyte derangement, slightly hyperglycemic.  Mild anion gap of 17, not in DKA.  [HS]  1424 Magnesium(!) Mild decrease [HS]  1424 Brain natriuretic peptide(!) Mild elevation  [HS]  1429 DG Chest Portable 1 View Neg  [HS]  1447 Stable syncope episode. Likely UTI. Has been evaluated multiple times for syncope and not cardiac in origin and has a loop recorder. Todays interrogation is reassuring.  Echo last year ok. Poor PO intake  [CC]  1508 Troponin I (High Sensitivity) Negative troponin, no ischemic change on EKG not consistent with ACS [HS]    Clinical Course User Index [CC] Tretha Sciara, MD [HS] Sherrill Raring, [place]                             Medical  Decision Making Amount and/or Complexity of Data Reviewed Independent Historian: guardian and EMS    Details: Daughter alecia, ems - see  HPI External Data Reviewed: labs, radiology, ECG and notes.    Details: See HPI/MDM Labs: ordered. Decision-making details documented in ED Course. Radiology: ordered. Decision-making details documented in ED Course. ECG/medicine tests: ordered. Decision-making details documented in ED Course.  Risk Prescription drug management.   This is a 89 year old female presenting to the emergency department due to syncopal event.  Differential is broad and includes but not limited to vasovagal, electrolyte derangement, dehydration, AKI, sepsis, pneumonia, UTI, ACS, dissection, seizure, hypothyroid, arrhythmia, medication reaction.  Presentation does not sound like a seizure and there was no postictal activity, there is no prodromal symptoms although ACS is still consideration will check EKG, troponin and chest x-ray.  Patient has a pacemaker, will interrogate device.  Presentation does not appear consistent with dissection, no lateralized weakness or signs of TIA/CVA.  Will check basic labs, thyroid function, COVID, electrolytes.  There were no SIRS criteria, clinically patient is not septic.  Reviewed external medical records.  Patient's last echocardiogram 05/10/2022 with LVEF 65 to 75%.   [employer] rep reviewed loop recorder report, no cardiac events today or yesterday.    I reevaluate patient multiple times, resting comfortably without any changes in mentation.  Clinically, patient is not septic.  Her TSH is slightly low which could be secondary to weight loss, she will need to follow-up with her primary regarding this, I will not make any changes Synthroid at this time.  Encouraged p.o. intake at home, antibiotics for the UTI and show return precautions.  She has been seen multiple times for syncopal events including 5 EDvisits in the last year.  Loop recorder report was reassuring, negative troponin and EKGs without ischemic changes so I do not think this is ACS or an arrhythmia. Do not think this  is cardiac.  According to patient's daughter she actually has been somewhat more altered/confused than baseline is hard to gauge given history of dementia.  Considered admission for worsening confusion secondary to UTI but after shared decision making patient family here would prefer to go home.  I think that is reasonable, strict return precautions were discussed with the patient verbalized understanding.  Ultimately suspect patient syncopal episode was multifactorial including hypovolemia due to dehydration as well as underlying UTI.  Discussed HPI, physical exam and plan of care for this patient with attending The attending physician evaluated this patient as part of a shared visit and agrees with plan of care.         Final Clinical Impression(s) / ED Diagnoses Final diagnoses:  Syncope and collapse  Acute cystitis without hematuria    Rx / DC Orders ED Discharge Orders          Ordered    cephALEXin (KEFLEX) 500 MG capsule  4 times daily        09/03/22 1507              Sherrill Raring, PA-C 09/03/22 1629    Tretha Sciara, MD 09/04/22 1623

## 2022-09-03 NOTE — Discharge Instructions (Signed)
You are seen today in the emergency department due to loss of consciousness.  Your workup today was overall reassuring, you do have a urinary tract infection take the antibiotic Keflex 1000 mg twice daily for the next 5 days.  Drink plenty of fluids, follow-up with your primary in the next 2 days for reevaluation.  Your thyroid stimulating hormone was slightly low today, they may need to make adjustments to your Synthroid so please follow-up regarding this as well.  Return to the emergency department if you have chest pain, lateralized weakness, another syncopal episode, confusion, fevers or new or concerning symptoms.

## 2022-09-04 DIAGNOSIS — E039 Hypothyroidism, unspecified: Secondary | ICD-10-CM | POA: Diagnosis not present

## 2022-09-04 DIAGNOSIS — E1122 Type 2 diabetes mellitus with diabetic chronic kidney disease: Secondary | ICD-10-CM | POA: Diagnosis not present

## 2022-09-04 DIAGNOSIS — Z79891 Long term (current) use of opiate analgesic: Secondary | ICD-10-CM | POA: Diagnosis not present

## 2022-09-04 DIAGNOSIS — G9341 Metabolic encephalopathy: Secondary | ICD-10-CM | POA: Diagnosis not present

## 2022-09-04 DIAGNOSIS — E440 Moderate protein-calorie malnutrition: Secondary | ICD-10-CM | POA: Diagnosis not present

## 2022-09-04 DIAGNOSIS — N179 Acute kidney failure, unspecified: Secondary | ICD-10-CM | POA: Diagnosis not present

## 2022-09-04 DIAGNOSIS — E1159 Type 2 diabetes mellitus with other circulatory complications: Secondary | ICD-10-CM | POA: Diagnosis not present

## 2022-09-04 DIAGNOSIS — I152 Hypertension secondary to endocrine disorders: Secondary | ICD-10-CM | POA: Diagnosis not present

## 2022-09-04 DIAGNOSIS — N182 Chronic kidney disease, stage 2 (mild): Secondary | ICD-10-CM | POA: Diagnosis not present

## 2022-09-04 DIAGNOSIS — J690 Pneumonitis due to inhalation of food and vomit: Secondary | ICD-10-CM | POA: Diagnosis not present

## 2022-09-04 DIAGNOSIS — E1151 Type 2 diabetes mellitus with diabetic peripheral angiopathy without gangrene: Secondary | ICD-10-CM | POA: Diagnosis not present

## 2022-09-04 DIAGNOSIS — E11649 Type 2 diabetes mellitus with hypoglycemia without coma: Secondary | ICD-10-CM | POA: Diagnosis not present

## 2022-09-04 DIAGNOSIS — L89152 Pressure ulcer of sacral region, stage 2: Secondary | ICD-10-CM | POA: Diagnosis not present

## 2022-09-04 DIAGNOSIS — Z794 Long term (current) use of insulin: Secondary | ICD-10-CM | POA: Diagnosis not present

## 2022-09-04 DIAGNOSIS — Z7982 Long term (current) use of aspirin: Secondary | ICD-10-CM | POA: Diagnosis not present

## 2022-09-04 DIAGNOSIS — R69 Illness, unspecified: Secondary | ICD-10-CM | POA: Diagnosis not present

## 2022-09-04 DIAGNOSIS — G40909 Epilepsy, unspecified, not intractable, without status epilepticus: Secondary | ICD-10-CM | POA: Diagnosis not present

## 2022-09-04 DIAGNOSIS — D631 Anemia in chronic kidney disease: Secondary | ICD-10-CM | POA: Diagnosis not present

## 2022-09-08 DIAGNOSIS — E1140 Type 2 diabetes mellitus with diabetic neuropathy, unspecified: Secondary | ICD-10-CM | POA: Diagnosis not present

## 2022-09-08 DIAGNOSIS — E1165 Type 2 diabetes mellitus with hyperglycemia: Secondary | ICD-10-CM | POA: Diagnosis not present

## 2022-09-08 DIAGNOSIS — Z794 Long term (current) use of insulin: Secondary | ICD-10-CM | POA: Diagnosis not present

## 2022-09-08 DIAGNOSIS — E119 Type 2 diabetes mellitus without complications: Secondary | ICD-10-CM | POA: Diagnosis not present

## 2022-09-09 DIAGNOSIS — Z79891 Long term (current) use of opiate analgesic: Secondary | ICD-10-CM | POA: Diagnosis not present

## 2022-09-09 DIAGNOSIS — E039 Hypothyroidism, unspecified: Secondary | ICD-10-CM | POA: Diagnosis not present

## 2022-09-09 DIAGNOSIS — I152 Hypertension secondary to endocrine disorders: Secondary | ICD-10-CM | POA: Diagnosis not present

## 2022-09-09 DIAGNOSIS — R69 Illness, unspecified: Secondary | ICD-10-CM | POA: Diagnosis not present

## 2022-09-09 DIAGNOSIS — E1159 Type 2 diabetes mellitus with other circulatory complications: Secondary | ICD-10-CM | POA: Diagnosis not present

## 2022-09-09 DIAGNOSIS — J690 Pneumonitis due to inhalation of food and vomit: Secondary | ICD-10-CM | POA: Diagnosis not present

## 2022-09-09 DIAGNOSIS — G9341 Metabolic encephalopathy: Secondary | ICD-10-CM | POA: Diagnosis not present

## 2022-09-09 DIAGNOSIS — L89152 Pressure ulcer of sacral region, stage 2: Secondary | ICD-10-CM | POA: Diagnosis not present

## 2022-09-09 DIAGNOSIS — E11649 Type 2 diabetes mellitus with hypoglycemia without coma: Secondary | ICD-10-CM | POA: Diagnosis not present

## 2022-09-09 DIAGNOSIS — Z794 Long term (current) use of insulin: Secondary | ICD-10-CM | POA: Diagnosis not present

## 2022-09-09 DIAGNOSIS — N182 Chronic kidney disease, stage 2 (mild): Secondary | ICD-10-CM | POA: Diagnosis not present

## 2022-09-09 DIAGNOSIS — E440 Moderate protein-calorie malnutrition: Secondary | ICD-10-CM | POA: Diagnosis not present

## 2022-09-09 DIAGNOSIS — E1151 Type 2 diabetes mellitus with diabetic peripheral angiopathy without gangrene: Secondary | ICD-10-CM | POA: Diagnosis not present

## 2022-09-09 DIAGNOSIS — Z7982 Long term (current) use of aspirin: Secondary | ICD-10-CM | POA: Diagnosis not present

## 2022-09-09 DIAGNOSIS — D631 Anemia in chronic kidney disease: Secondary | ICD-10-CM | POA: Diagnosis not present

## 2022-09-09 DIAGNOSIS — N179 Acute kidney failure, unspecified: Secondary | ICD-10-CM | POA: Diagnosis not present

## 2022-09-09 DIAGNOSIS — G40909 Epilepsy, unspecified, not intractable, without status epilepticus: Secondary | ICD-10-CM | POA: Diagnosis not present

## 2022-09-09 DIAGNOSIS — E1122 Type 2 diabetes mellitus with diabetic chronic kidney disease: Secondary | ICD-10-CM | POA: Diagnosis not present

## 2022-09-10 ENCOUNTER — Emergency Department (HOSPITAL_COMMUNITY): Payer: Medicare HMO

## 2022-09-10 ENCOUNTER — Observation Stay (HOSPITAL_COMMUNITY): Payer: Medicare HMO

## 2022-09-10 ENCOUNTER — Observation Stay (HOSPITAL_COMMUNITY)
Admission: EM | Admit: 2022-09-10 | Discharge: 2022-09-11 | Disposition: A | Discharge status: Home / Self Care | Payer: Medicare HMO | Attending: Internal Medicine | Admitting: Internal Medicine

## 2022-09-10 DIAGNOSIS — R29818 Other symptoms and signs involving the nervous system: Secondary | ICD-10-CM | POA: Insufficient documentation

## 2022-09-10 DIAGNOSIS — M6281 Muscle weakness (generalized): Secondary | ICD-10-CM | POA: Diagnosis not present

## 2022-09-10 DIAGNOSIS — B3731 Acute candidiasis of vulva and vagina: Secondary | ICD-10-CM | POA: Insufficient documentation

## 2022-09-10 DIAGNOSIS — R2689 Other abnormalities of gait and mobility: Secondary | ICD-10-CM | POA: Diagnosis not present

## 2022-09-10 DIAGNOSIS — I129 Hypertensive chronic kidney disease with stage 1 through stage 4 chronic kidney disease, or unspecified chronic kidney disease: Secondary | ICD-10-CM | POA: Diagnosis not present

## 2022-09-10 DIAGNOSIS — E1122 Type 2 diabetes mellitus with diabetic chronic kidney disease: Secondary | ICD-10-CM | POA: Diagnosis not present

## 2022-09-10 DIAGNOSIS — R1310 Dysphagia, unspecified: Secondary | ICD-10-CM | POA: Diagnosis not present

## 2022-09-10 DIAGNOSIS — Z1152 Encounter for screening for COVID-19: Secondary | ICD-10-CM | POA: Diagnosis not present

## 2022-09-10 DIAGNOSIS — R4182 Altered mental status, unspecified: Secondary | ICD-10-CM

## 2022-09-10 DIAGNOSIS — R262 Difficulty in walking, not elsewhere classified: Secondary | ICD-10-CM | POA: Diagnosis not present

## 2022-09-10 DIAGNOSIS — N189 Chronic kidney disease, unspecified: Secondary | ICD-10-CM | POA: Insufficient documentation

## 2022-09-10 DIAGNOSIS — E039 Hypothyroidism, unspecified: Secondary | ICD-10-CM | POA: Insufficient documentation

## 2022-09-10 DIAGNOSIS — Z743 Need for continuous supervision: Secondary | ICD-10-CM | POA: Diagnosis not present

## 2022-09-10 DIAGNOSIS — Z79899 Other long term (current) drug therapy: Secondary | ICD-10-CM | POA: Insufficient documentation

## 2022-09-10 DIAGNOSIS — R404 Transient alteration of awareness: Secondary | ICD-10-CM | POA: Diagnosis not present

## 2022-09-10 DIAGNOSIS — Z8673 Personal history of transient ischemic attack (TIA), and cerebral infarction without residual deficits: Secondary | ICD-10-CM | POA: Diagnosis not present

## 2022-09-10 DIAGNOSIS — F0390 Unspecified dementia without behavioral disturbance: Secondary | ICD-10-CM | POA: Diagnosis present

## 2022-09-10 DIAGNOSIS — Z87891 Personal history of nicotine dependence: Secondary | ICD-10-CM | POA: Insufficient documentation

## 2022-09-10 DIAGNOSIS — G459 Transient cerebral ischemic attack, unspecified: Secondary | ICD-10-CM | POA: Diagnosis not present

## 2022-09-10 DIAGNOSIS — Z794 Long term (current) use of insulin: Secondary | ICD-10-CM | POA: Diagnosis not present

## 2022-09-10 DIAGNOSIS — G9340 Encephalopathy, unspecified: Principal | ICD-10-CM | POA: Insufficient documentation

## 2022-09-10 DIAGNOSIS — R531 Weakness: Secondary | ICD-10-CM | POA: Diagnosis not present

## 2022-09-10 DIAGNOSIS — I10 Essential (primary) hypertension: Secondary | ICD-10-CM | POA: Diagnosis not present

## 2022-09-10 DIAGNOSIS — Z87898 Personal history of other specified conditions: Secondary | ICD-10-CM

## 2022-09-10 DIAGNOSIS — I6523 Occlusion and stenosis of bilateral carotid arteries: Secondary | ICD-10-CM | POA: Diagnosis not present

## 2022-09-10 DIAGNOSIS — R69 Illness, unspecified: Secondary | ICD-10-CM | POA: Diagnosis not present

## 2022-09-10 DIAGNOSIS — I959 Hypotension, unspecified: Secondary | ICD-10-CM | POA: Diagnosis not present

## 2022-09-10 DIAGNOSIS — Z0389 Encounter for observation for other suspected diseases and conditions ruled out: Secondary | ICD-10-CM | POA: Diagnosis not present

## 2022-09-10 LAB — DIFFERENTIAL
Abs Immature Granulocytes: 0.07 10*3/uL (ref 0.00–0.07)
Basophils Absolute: 0 10*3/uL (ref 0.0–0.1)
Basophils Relative: 0 %
Eosinophils Absolute: 0.5 10*3/uL (ref 0.0–0.5)
Eosinophils Relative: 5 %
Immature Granulocytes: 1 %
Lymphocytes Relative: 9 %
Lymphs Abs: 0.8 10*3/uL (ref 0.7–4.0)
Monocytes Absolute: 0.5 10*3/uL (ref 0.1–1.0)
Monocytes Relative: 6 %
Neutro Abs: 7.3 10*3/uL (ref 1.7–7.7)
Neutrophils Relative %: 79 %

## 2022-09-10 LAB — CBC
HCT: 27.6 % — ABNORMAL LOW (ref 36.0–46.0)
Hemoglobin: 9.5 g/dL — ABNORMAL LOW (ref 12.0–15.0)
MCH: 33.1 pg (ref 26.0–34.0)
MCHC: 34.4 g/dL (ref 30.0–36.0)
MCV: 96.2 fL (ref 80.0–100.0)
Platelets: 260 10*3/uL (ref 150–400)
RBC: 2.87 MIL/uL — ABNORMAL LOW (ref 3.87–5.11)
RDW: 14.2 % (ref 11.5–15.5)
WBC: 9.2 10*3/uL (ref 4.0–10.5)
nRBC: 0 % (ref 0.0–0.2)

## 2022-09-10 LAB — COMPREHENSIVE METABOLIC PANEL
ALT: 24 U/L (ref 0–44)
AST: 38 U/L (ref 15–41)
Albumin: 2.7 g/dL — ABNORMAL LOW (ref 3.5–5.0)
Alkaline Phosphatase: 76 U/L (ref 38–126)
Anion gap: 12 (ref 5–15)
BUN: 17 mg/dL (ref 8–23)
CO2: 25 mmol/L (ref 22–32)
Calcium: 9.4 mg/dL (ref 8.9–10.3)
Chloride: 97 mmol/L — ABNORMAL LOW (ref 98–111)
Creatinine, Ser: 0.86 mg/dL (ref 0.44–1.00)
GFR, Estimated: >60 mL/min (ref >60)
Glucose, Bld: 93 mg/dL (ref 70–99)
Potassium: 3.5 mmol/L (ref 3.5–5.1)
Sodium: 134 mmol/L — ABNORMAL LOW (ref 135–145)
Total Bilirubin: 0.5 mg/dL (ref 0.3–1.2)
Total Protein: 5.7 g/dL — ABNORMAL LOW (ref 6.5–8.1)

## 2022-09-10 LAB — URINALYSIS, ROUTINE W REFLEX MICROSCOPIC
Bacteria, UA: NONE SEEN
Bilirubin Urine: NEGATIVE
Glucose, UA: 50 mg/dL — AB
Hgb urine dipstick: NEGATIVE
Ketones, ur: 5 mg/dL — AB
Nitrite: NEGATIVE
Protein, ur: NEGATIVE mg/dL
Specific Gravity, Urine: 1.02 (ref 1.005–1.030)
pH: 6 (ref 5.0–8.0)

## 2022-09-10 LAB — RESP PANEL BY RT-PCR (RSV, FLU A&B, COVID)  RVPGX2
Influenza A by PCR: NEGATIVE
Influenza B by PCR: NEGATIVE
Resp Syncytial Virus by PCR: NEGATIVE
SARS Coronavirus 2 by RT PCR: NEGATIVE

## 2022-09-10 LAB — TSH: TSH: 0.069 u[IU]/mL — ABNORMAL LOW (ref 0.350–4.500)

## 2022-09-10 LAB — CBG MONITORING, ED: Glucose-Capillary: 85 mg/dL (ref 70–99)

## 2022-09-10 LAB — RAPID URINE DRUG SCREEN, HOSP PERFORMED
Amphetamines: NOT DETECTED
Barbiturates: NOT DETECTED
Benzodiazepines: NOT DETECTED
Cocaine: NOT DETECTED
Opiates: NOT DETECTED
Tetrahydrocannabinol: NOT DETECTED

## 2022-09-10 LAB — I-STAT CHEM 8, ED
BUN: 19 mg/dL (ref 8–23)
Calcium, Ion: 1.24 mmol/L (ref 1.15–1.40)
Chloride: 97 mmol/L — ABNORMAL LOW (ref 98–111)
Creatinine, Ser: 0.8 mg/dL (ref 0.44–1.00)
Glucose, Bld: 90 mg/dL (ref 70–99)
HCT: 30 % — ABNORMAL LOW (ref 36.0–46.0)
Hemoglobin: 10.2 g/dL — ABNORMAL LOW (ref 12.0–15.0)
Potassium: 3.6 mmol/L (ref 3.5–5.1)
Sodium: 133 mmol/L — ABNORMAL LOW (ref 135–145)
TCO2: 28 mmol/L (ref 22–32)

## 2022-09-10 LAB — ETHANOL: Alcohol, Ethyl (B): <10 mg/dL (ref <10)

## 2022-09-10 LAB — GLUCOSE, CAPILLARY
Glucose-Capillary: 176 mg/dL — ABNORMAL HIGH (ref 70–99)
Glucose-Capillary: 322 mg/dL — ABNORMAL HIGH (ref 70–99)

## 2022-09-10 LAB — PROTIME-INR
INR: 1.1 (ref 0.8–1.2)
Prothrombin Time: 13.6 seconds (ref 11.4–15.2)

## 2022-09-10 LAB — APTT: aPTT: 29 seconds (ref 24–36)

## 2022-09-10 MED ORDER — LEVOTHYROXINE SODIUM 75 MCG PO TABS: 150.0000 ug | ORAL_TABLET | Freq: Every day | ORAL | Status: DC

## 2022-09-10 MED ORDER — ASPIRIN 81 MG PO TBEC
81.0000 mg | DELAYED_RELEASE_TABLET | Freq: Every day | ORAL | Status: DC
  Administered 2022-09-11: 81 mg via ORAL
  Filled 2022-09-10: qty 1

## 2022-09-10 MED ORDER — SODIUM CHLORIDE 0.9 % IV SOLN
75.0000 mL/h | INTRAVENOUS | Status: DC
  Administered 2022-09-10 – 2022-09-11 (×2): 75 mL/h via INTRAVENOUS

## 2022-09-10 MED ORDER — ORAL CARE MOUTH RINSE: 15.0000 mL | OROMUCOSAL | Status: DC | PRN

## 2022-09-10 MED ORDER — POLYETHYLENE GLYCOL 3350 17 G PO PACK: 17.0000 g | PACK | Freq: Every day | ORAL | Status: DC | PRN

## 2022-09-10 MED ORDER — PREDNISONE 5 MG PO TABS
5.0000 mg | ORAL_TABLET | Freq: Every day | ORAL | Status: DC
  Administered 2022-09-11: 5 mg via ORAL
  Filled 2022-09-10: qty 1

## 2022-09-10 MED ORDER — LORATADINE 10 MG PO TABS
10.0000 mg | ORAL_TABLET | Freq: Every day | ORAL | Status: DC
  Administered 2022-09-10 – 2022-09-11 (×2): 10 mg via ORAL
  Filled 2022-09-10 (×2): qty 1

## 2022-09-10 MED ORDER — LOPERAMIDE HCL 2 MG PO CAPS: 2.0000 mg | ORAL_CAPSULE | Freq: Four times a day (QID) | ORAL | Status: DC | PRN

## 2022-09-10 MED ORDER — LEVETIRACETAM 500 MG PO TABS: 500.0000 mg | ORAL_TABLET | Freq: Two times a day (BID) | ORAL | Status: DC

## 2022-09-10 MED ORDER — SODIUM CHLORIDE 0.9 % IV BOLUS
500.0000 mL | Freq: Once | INTRAVENOUS | Status: AC
  Administered 2022-09-10: 500 mL via INTRAVENOUS

## 2022-09-10 MED ORDER — METOPROLOL TARTRATE 25 MG PO TABS
25.0000 mg | ORAL_TABLET | Freq: Two times a day (BID) | ORAL | Status: DC
  Administered 2022-09-10 – 2022-09-11 (×3): 25 mg via ORAL
  Filled 2022-09-10 (×3): qty 1

## 2022-09-10 MED ORDER — LEVOTHYROXINE SODIUM 100 MCG PO TABS: 200.0000 ug | ORAL_TABLET | Freq: Every day | ORAL | Status: DC

## 2022-09-10 MED ORDER — ORAL CARE MOUTH RINSE
15.0000 mL | OROMUCOSAL | Status: DC
  Administered 2022-09-10 – 2022-09-11 (×8): 15 mL via OROMUCOSAL

## 2022-09-10 MED ORDER — MIDODRINE HCL 5 MG PO TABS: 5.0000 mg | ORAL_TABLET | Freq: Two times a day (BID) | ORAL | Status: DC

## 2022-09-10 MED ORDER — FLUCONAZOLE 150 MG PO TABS
150.0000 mg | ORAL_TABLET | Freq: Once | ORAL | Status: AC
  Administered 2022-09-10: 150 mg via ORAL
  Filled 2022-09-10: qty 1

## 2022-09-10 MED ORDER — FAMOTIDINE 20 MG PO TABS
20.0000 mg | ORAL_TABLET | Freq: Every day | ORAL | Status: DC
  Administered 2022-09-10: 20 mg via ORAL
  Filled 2022-09-10: qty 1

## 2022-09-10 MED ORDER — IOHEXOL 350 MG/ML SOLN
100.0000 mL | Freq: Once | INTRAVENOUS | Status: AC | PRN
  Administered 2022-09-10: 100 mL via INTRAVENOUS

## 2022-09-10 MED ORDER — INSULIN ASPART 100 UNIT/ML IJ SOLN
0.0000 [IU] | Freq: Three times a day (TID) | INTRAMUSCULAR | Status: DC
  Administered 2022-09-10: 2 [IU] via SUBCUTANEOUS
  Administered 2022-09-11: 9 [IU] via SUBCUTANEOUS
  Administered 2022-09-11: 5 [IU] via SUBCUTANEOUS

## 2022-09-10 MED ORDER — ACETAMINOPHEN 325 MG PO TABS: 650.0000 mg | ORAL_TABLET | Freq: Three times a day (TID) | ORAL | Status: DC | PRN

## 2022-09-10 MED ORDER — LEVETIRACETAM 750 MG PO TABS
750.0000 mg | ORAL_TABLET | Freq: Two times a day (BID) | ORAL | Status: DC
  Administered 2022-09-10 – 2022-09-11 (×3): 750 mg via ORAL
  Filled 2022-09-10 (×3): qty 1

## 2022-09-10 MED ORDER — FOLIC ACID 1 MG PO TABS
1.0000 mg | ORAL_TABLET | Freq: Every day | ORAL | Status: DC
  Administered 2022-09-10: 1 mg via ORAL
  Filled 2022-09-10: qty 1

## 2022-09-10 MED ORDER — LEVOTHYROXINE SODIUM 100 MCG PO TABS
100.0000 ug | ORAL_TABLET | Freq: Every day | ORAL | Status: DC
  Administered 2022-09-11: 100 ug via ORAL
  Filled 2022-09-10: qty 1

## 2022-09-10 MED ORDER — HEPARIN SODIUM (PORCINE) 5000 UNIT/ML IJ SOLN
5000.0000 [IU] | Freq: Two times a day (BID) | INTRAMUSCULAR | Status: DC
  Administered 2022-09-10 – 2022-09-11 (×3): 5000 [IU] via SUBCUTANEOUS
  Filled 2022-09-10 (×3): qty 1

## 2022-09-10 NOTE — ED Triage Notes (Addendum)
Pt bib ems from from home; family reports pt did not get out of bed yesterday (normally oob) and stopped speaking around 0830 this am; pt alert, responsive to painful stimuli, not following commands; VSS; bp 140/96;  cbg 90

## 2022-09-10 NOTE — Consult Note (Signed)
Neurology Consultation  Reason for Consult: Code Stroke Referring Physician: Melina Copa  CC: unresponsive, right gaze  History is obtained from: Family, EMS  HPI: [name] [name] is a 89 y.o. female with a past medical history of cryptogenic stroke in 2021, seizure on keppra, DM, GERD, HLD, HTN, hypothyroidism, osteoporosis, CKD, and ascites presenting unresponsive and with right gaze deviation. Per daughters she was talking this morning, but she did not get out of bed yesterday which is abnormal for her. They report a couple of days of increasing fatigue. At baseline she does occasionally get up into her wheelchair, but generally she needs help with ADLs. Additionally she has a history of "vasovagal episodes" that present with right gaze deviation, while she is sitting or standing and she has confusion after. Her daughter states that this episode was longer than normal. She is compliant with her medications including keppra and ASA.   LKW: 0830? tpa given?: No Premorbid modified Rankin scale (mRS): 4  ROS: Unable to obtain due to altered mental status.   Past Medical History:  Diagnosis Date   Arthritis    "knees, legs" (06/10/2016)   Ascites    Chronic kidney disease    "related to my diabetes"   Chronic lower back pain    Diabetes mellitus without complication (HCC)    Eczema    GERD (gastroesophageal reflux disease)    High cholesterol    Hyperlipidemia    Hypertension    Hypothyroidism    OAB (overactive bladder)    Osteoporosis    Thyroid disease    Type II diabetes mellitus ([place])    Urticaria      Family History  Problem Relation Age of Onset   Diabetes Mother    Hypertension Mother    Asthma Father    Diabetes Sister    Stomach cancer Sister    Diabetes Brother    Allergic rhinitis Neg Hx    Eczema Neg Hx    Urticaria Neg Hx      Social History:   reports that she has never smoked. She quit smokeless tobacco use about 39 years ago.  Her smokeless tobacco use  included chew. She reports that she does not drink alcohol and does not use drugs.  Medications No current facility-administered medications for this encounter.  Current Outpatient Medications:    acetaminophen (TYLENOL) 650 MG CR tablet, Take 650 mg by mouth every 8 (eight) hours as needed for pain., Disp: , Rfl:    BAYER LOW DOSE 81 MG EC tablet, Take 81 mg by mouth daily. Swallow whole., Disp: , Rfl:    cephALEXin (KEFLEX) 500 MG capsule, Take 1 capsule (500 mg total) by mouth 4 (four) times daily., Disp: 20 capsule, Rfl: 0   cetirizine (ZYRTEC) 10 MG tablet, Take 10 mg by mouth daily as needed (itching)., Disp: , Rfl:    Cholecalciferol (VITAMIN D-3 PO), Take 1,000 Units by mouth daily., Disp: , Rfl:    Famotidine (PEPCID AC PO), Take 20 mg by mouth at bedtime., Disp: , Rfl:    folic acid (FOLVITE) 1 MG tablet, Take 1 mg by mouth at bedtime., Disp: , Rfl:    insulin aspart (NOVOLOG FLEXPEN) 100 UNIT/ML FlexPen, Inject 2-10 Units into the skin See admin instructions. Inject 2-10 units into the skin three times a day with meals, per sliding scale:  Breakfast: BGL 80-199 = 8 units; 200-299 = 9 units; 300 or greater = 10 units Lunch: BGL 80-199 = 5 units; 200-299 =  6 units; 300 or greater = 7 units Supper/evening meal: BGL 80-199 = 2 units; 200-299 = 3 units; 300 or greater = 4 units, Disp: , Rfl:    insulin detemir (LEVEMIR FLEXTOUCH) 100 UNIT/ML FlexPen, Inject 3-7 Units into the skin See admin instructions. Inject 7 units into the skin in the morning and 3 units at bedtime if BGL is 200 or below and 4 units if BGL is greater than 200, Disp: , Rfl:    levETIRAcetam (KEPPRA) 500 MG tablet, Take 1 tablet (500 mg total) by mouth 2 (two) times daily., Disp: 60 tablet, Rfl: 2   liver oil-zinc oxide (DESITIN) 40 % ointment, Apply topically 3 (three) times daily. (Patient taking differently: Apply 1 Application topically as needed for irritation.), Disp: 56.7 g, Rfl: 0   loperamide (IMODIUM A-D) 2 MG  tablet, Take 2 mg by mouth 4 (four) times daily as needed for diarrhea or loose stools., Disp: , Rfl:    methotrexate (RHEUMATREX) 2.5 MG tablet, Take 1 tablet (2.5 mg total) by mouth once a week. Caution:Chemotherapy. Protect from light. (Patient taking differently: Take 15 mg by mouth every Saturday. Caution:Chemotherapy. Protect from light.), Disp: 4 tablet, Rfl: 0   metoprolol tartrate (LOPRESSOR) 25 MG tablet, Take 1 tablet (25 mg total) by mouth 2 (two) times daily., Disp: 60 tablet, Rfl: 3   midodrine (PROAMATINE) 5 MG tablet, Take 1 tablet (5 mg total) by mouth 3 (three) times daily with meals. (Patient taking differently: Take 5 mg by mouth 2 (two) times daily with a meal.), Disp: 90 tablet, Rfl: 2   polyethylene glycol (MIRALAX / GLYCOLAX) 17 g packet, Take 17 g by mouth daily. (Patient taking differently: Take 17 g by mouth daily as needed for mild constipation.), Disp: 14 each, Rfl: 0   predniSONE (DELTASONE) 5 MG tablet, Take 5 mg by mouth daily with breakfast., Disp: , Rfl:    SYNTHROID 200 MCG tablet, Take 200 mcg by mouth daily before breakfast., Disp: , Rfl:    triamcinolone ointment (KENALOG) 0.1 %, Apply 1 Application topically 2 (two) times daily as needed (itching, rash)., Disp: , Rfl:    Exam: Current vital signs: Wt 46.6 kg   BMI 16.58 kg/m  Vital signs in last 24 hours: Weight:  [46.6 kg] 46.6 kg (02/22 1100)  GENERAL: Awake, NAD HEENT: - Normocephalic and atraumatic, dry mm, no LN++, no Thyromegally LUNGS - Clear to auscultation bilaterally with no wheezes CV - S1S2 RRR, no m/r/g, equal pulses bilaterally. ABDOMEN - Soft, nontender, nondistended with normoactive BS Ext: warm, well perfused, intact peripheral pulses, no edema  NEURO:  Mental Status: Awake, eyes open does not follow commands.  Language: speech is absent, she does moan and say "ow" on second exam Cranial Nerves: PERRL. Eyes initially not crossing midline to the right. Midline on second exam, does not  consistently blink to threat on the right, right facial droop, hearing intact, tongue/uvula/soft palate midline, nhead is midline, does not protrude tongue to command Motor:  Bilateral upper extremities fall to bed in 5 seconds, but she is able to lift them antigravity Does not lift legs antigravity  -improves throughout exam Sensation- withdraws to painful stimuli in all extremities Coordination: No obvious ataxia on exam, unable to complete FNF or HKS  Gait- deferred    NIHSS components Score: Comment  1a Level of Conscious 0[x]$  1[]$  2[]$  3[]$         1b LOC Questions 0[]$  1[]$  2[x]$   1c LOC Commands 0[]$  1[]$  2[x]$           2 Best Gaze 0[x]$  1[]$  2[]$           3 Visual 0[x]$  1[]$  2[]$  3[]$         4 Facial Palsy 0[]$  1[x]$  2[]$  3[]$         5a Motor Arm - left 0[]$  1[]$  2[x]$  3[]$  4[]$  UN[]$     5b Motor Arm - Right 0[]$  1[]$  2[x]$  3[]$  4[]$  UN[]$     6a Motor Leg - Left 0[]$  1[]$  2[]$  3[x]$  4[]$  UN[]$     6b Motor Leg - Right 0[]$  1[]$  2[]$  3[x]$  4[]$  UN[]$     7 Limb Ataxia 0[x]$  1[]$  2[]$  3[]$  UN[]$       8 Sensory 0[x]$  1[]$  2[]$  UN[]$         9 Best Language 0[]$  1[]$  2[]$  3[x]$         10 Dysarthria 0[]$  1[]$  2[x]$  UN[]$         11 Extinct. and Inattention 0[x]$  1[]$  2[]$           TOTAL: 20        Labs I have reviewed labs in epic and the results pertinent to this consultation are:  CBC    Component Value Date/Time   WBC 7.3 09/03/2022 1230   RBC 3.20 (L) 09/03/2022 1230   HGB 10.2 (L) 09/10/2022 1123   HGB 11.2 (L) 10/10/2008 1305   HCT 30.0 (L) 09/10/2022 1123   HCT 33.3 (L) 10/10/2008 1305   PLT 301 09/03/2022 1230   PLT 381 10/10/2008 1305   MCV 100.6 (H) 09/03/2022 1230   MCV 89.6 10/10/2008 1305   MCH 31.6 09/03/2022 1230   MCHC 31.4 09/03/2022 1230   RDW 14.6 09/03/2022 1230   RDW 13.3 10/10/2008 1305   LYMPHSABS 0.7 09/03/2022 1230   LYMPHSABS 0.9 10/10/2008 1305   MONOABS 0.8 09/03/2022 1230   MONOABS 0.3 10/10/2008 1305   EOSABS 0.5 09/03/2022 1230   EOSABS 0.0 10/10/2008 1305   BASOSABS 0.0 09/03/2022 1230    BASOSABS 0.0 10/10/2008 1305    CMP     Component Value Date/Time   NA 133 (L) 09/10/2022 1123   K 3.6 09/10/2022 1123   CL 97 (L) 09/10/2022 1123   CO2 23 09/03/2022 1230   GLUCOSE 90 09/10/2022 1123   BUN 19 09/10/2022 1123   CREATININE 0.80 09/10/2022 1123   CREATININE 0.98 09/25/2014 1654   CALCIUM 9.5 09/03/2022 1230   PROT 6.1 (L) 09/03/2022 1230   ALBUMIN 3.0 (L) 09/03/2022 1230   AST 28 09/03/2022 1230   ALT 16 09/03/2022 1230   ALKPHOS 72 09/03/2022 1230   BILITOT 0.8 09/03/2022 1230   GFRNONAA 59 (L) 09/03/2022 1230   GFRNONAA 55 (L) 09/25/2014 1654   GFRAA 54 (L) 10/12/2019 0236   GFRAA 64 09/25/2014 1654    Lipid Panel     Component Value Date/Time   CHOL 272 (H) 10/09/2019 0309   TRIG 66 10/09/2019 0309   TRIG 68 10/09/2019 0309   HDL 117 10/09/2019 0309   CHOLHDL 2.3 10/09/2019 0309   VLDL 13 10/09/2019 0309   LDLCALC 142 (H) 10/09/2019 0309   LDLDIRECT 101.2 01/28/2007 0854     Imaging I have reviewed the images obtained:  CT-head No evidence of acute intracranial abnormality. ASPECTS is 10.   CTA Head and Neck 1. No emergent large vessel occlusion. 2. Severe left M2 MCA branch stenosis. 3. Moderate right P2 PCA stenosis.  MRI examination of the brain  No acute abnormality  Assessment:  89 y.o. female past medical history of cryptogenic stroke in 2021, seizure on keppra, dermatomyositis, DM, GERD, HLD, HTN, hypothyroidism, osteoporosis, CKD, and ascites presenting unresponsive and with right gaze deviation. Her history of vasovagal episodes with confusion and gaze deviation is concerning for seizures. MRI was negative for acute infarct. She does currently take keppra 516m BID.   Impression: Seizure  Recommendations: - EEG - Increase keppra dose  - Keppra 759mBID. Adjust as needed for renal function:  Estimated Creatinine Clearance: 37.1 mL/min (by C-G formula based on SCr of 0.8 mg/dL).   CrCl 80 to 130 mL/minute/1.73 m2: 500 mg to  1.5 g every 12 hours.  CrCl 50 to <80 mL/minute/1.73 m2: 500 mg to 1 g every 12 hours.  CrCl 30 to <50 mL/minute/1.73 m2: 250 to 750 mg every 12 hours.  CrCl 15 to <30 mL/minute/1.73 m2: 250 to 500 mg every 12 hours.  CrCl <15 mL/minute/1.73 m2: 250 to 500 mg every 24 hours (expert opinion).    Patient seen and examined by NP/APP with MD. MD to update note as needed.   DeJanine OresDNP, FNP-BC [facility] Pager: [phone] have seen the patient and reviewed the above note.  The episodes that she describes with right gaze deviation, syncope and postictal exhaustion are concerning for partial seizures.  She has been started on Keppra at some point in the past and I think that her current presentation is most likely postictal in nature given the description.  She had a similar presentation in 2021 for which I did give her thrombolytics.  Her MRI was negative at that time and she was started on Keppra for that episode.  She has reportedly been compliant since that time.  My suspicion is that she had a seizure this morning.  She is still not talking, though she is more interactive than she was when she first arrived.  My suspicion is that she is postictal and she has a history of prolonged postictal episodes in the past.   I would favor getting STAT EEG, but if negative for ongoing seizure, would simply increase keppra and monitor for clinical improvement.   McRoland RackMD [facility] [phone] 7pm- 7am, please page neurology on call as listed in [place]

## 2022-09-10 NOTE — ED Notes (Signed)
ED TO INPATIENT HANDOFF REPORT  ED Nurse Name and Phone #: Richardson Landry [phone]  S Name/Age/Gender [name] [name] 89 y.o. female Room/Bed: 042C/042C  Code Status   Code Status: DNR  Home/SNF/Other Home Patient oriented to: self and place Is this baseline? No   Triage Complete: Triage complete  Chief Complaint AMS (altered mental status) [R41.82]  Triage Note Pt bib ems from from home; family reports pt did not get out of bed yesterday (normally oob) and stopped speaking around 0830 this am; pt alert, responsive to painful stimuli, not following commands; VSS; bp 140/96;  cbg 90   Allergies Allergies  Allergen Reactions   Nsaids Other (See Comments)    No NSAIDS due to kidney function- ESPECIALLY ibuprofen or Aleve   Biaxin [Clarithromycin] Hives   Bactrim [Sulfamethoxazole-Trimethoprim] Hives and Rash   Lisinopril Cough   Sulfa Antibiotics Hives, Rash and Other (See Comments)    NO sulfa-based meds!!     Level of Care/Admitting Diagnosis ED Disposition     ED Disposition  Admit   Condition  --   [place]: [place] [[id number]]  Level of Care: Telemetry Medical [104]  May place patient in observation at [facility] or [place] if equivalent level of care is available:: No  Covid Evaluation: Asymptomatic - no recent exposure (last 10 days) testing not required  Diagnosis: AMS (altered mental status) [id number]  Admitting Physician: Lequita Halt [id number]  Attending Physician: Lequita Halt [id number]          B Medical/Surgery History Past Medical History:  Diagnosis Date   Arthritis    "knees, legs" (06/10/2016)   Ascites    Chronic kidney disease    "related to my diabetes"   Chronic lower back pain    Diabetes mellitus without complication (HCC)    Eczema    GERD (gastroesophageal reflux disease)    High cholesterol    Hyperlipidemia    Hypertension    Hypothyroidism    OAB (overactive bladder)    Osteoporosis     Thyroid disease    Type II diabetes mellitus ([place])    Urticaria    Past Surgical History:  Procedure Laterality Date   ABDOMINAL HYSTERECTOMY     KNEE ARTHROSCOPY     LAPAROTOMY N/A 12/03/2016   Procedure: EXPLORATORY LAPAROTOMY, LYSIS OF ADHESIONS;  Surgeon: Armandina Gemma, MD;  Location: [facility];  Service: General;  Laterality: N/A;   LOOP RECORDER INSERTION N/A 08/23/2019   Procedure: LOOP RECORDER INSERTION;  Surgeon: Evans Lance, MD;  Location: [place] [facility];  Service: Cardiovascular;  Laterality: N/A;   vocal cord polyps       A IV Location/Drains/Wounds Patient Lines/Drains/Airways Status     Active Line/Drains/Airways     Name Placement date Placement time Site Days   Peripheral IV 09/10/22 18 G Left Antecubital 09/10/22  1141  Antecubital  less than 1   Peripheral IV 09/10/22 20 G Anterior;Left Forearm 09/10/22  --  Forearm  less than 1   External Urinary Catheter 09/10/22  1247  --  less than 1            Intake/Output Last 24 hours No intake or output data in the 24 hours ending 09/10/22 1529  Labs/Imaging Results for orders placed or performed during the hospital encounter of 09/10/22 (from the past 48 hour(s))  CBG monitoring, ED     Status: None   Collection Time: 09/10/22 11:17 AM  Result  Value Ref Range   Glucose-Capillary 85 70 - 99 mg/dL    Comment: Glucose reference range applies only to samples taken after fasting for at least 8 hours.  Ethanol     Status: None   Collection Time: 09/10/22 11:20 AM  Result Value Ref Range   Alcohol, Ethyl (B) <10 <10 mg/dL    Comment: (NOTE) Lowest detectable limit for serum alcohol is 10 mg/dL.  For medical purposes only. Performed at [facility], [place] [address] [place], [place] [address]   Protime-INR     Status: None   Collection Time: 09/10/22 11:20 AM  Result Value Ref Range   Prothrombin Time 13.6 11.4 - 15.2 seconds   INR 1.1 0.8 - 1.2    Comment: (NOTE) INR goal varies based on device  and disease states. Performed at [facility], [place] [address] [place], [place] [address]   APTT     Status: None   Collection Time: 09/10/22 11:20 AM  Result Value Ref Range   aPTT 29 24 - 36 seconds    Comment: Performed at [facility] [address] [place], [place] [address]  CBC     Status: Abnormal   Collection Time: 09/10/22 11:20 AM  Result Value Ref Range   WBC 9.2 4.0 - 10.5 K/uL   RBC 2.87 (L) 3.87 - 5.11 MIL/uL   Hemoglobin 9.5 (L) 12.0 - 15.0 g/dL   HCT 27.6 (L) 36.0 - 46.0 %   MCV 96.2 80.0 - 100.0 fL   MCH 33.1 26.0 - 34.0 pg   MCHC 34.4 30.0 - 36.0 g/dL   RDW 14.2 11.5 - 15.5 %   Platelets 260 150 - 400 K/uL   nRBC 0.0 0.0 - 0.2 %    Comment: Performed at [facility], [place] [address], [place], [place] [address]  Differential     Status: None   Collection Time: 09/10/22 11:20 AM  Result Value Ref Range   Neutrophils Relative % 79 %   Neutro Abs 7.3 1.7 - 7.7 K/uL   Lymphocytes Relative 9 %   Lymphs Abs 0.8 0.7 - 4.0 K/uL   Monocytes Relative 6 %   Monocytes Absolute 0.5 0.1 - 1.0 K/uL   Eosinophils Relative 5 %   Eosinophils Absolute 0.5 0.0 - 0.5 K/uL   Basophils Relative 0 %   Basophils Absolute 0.0 0.0 - 0.1 K/uL   Immature Granulocytes 1 %   Abs Immature Granulocytes 0.07 0.00 - 0.07 K/uL    Comment: Performed at [facility] [address], [place], [place] [address]  Comprehensive metabolic panel     Status: Abnormal   Collection Time: 09/10/22 11:20 AM  Result Value Ref Range   Sodium 134 (L) 135 - 145 mmol/L   Potassium 3.5 3.5 - 5.1 mmol/L   Chloride 97 (L) 98 - 111 mmol/L   CO2 25 22 - 32 mmol/L   Glucose, Bld 93 70 - 99 mg/dL    Comment: Glucose reference range applies only to samples taken after fasting for at least 8 hours.   BUN 17 8 - 23 mg/dL   Creatinine, Ser 0.86 0.44 - 1.00 mg/dL   Calcium 9.4 8.9 - 10.3 mg/dL   Total Protein 5.7 (L) 6.5 - 8.1 g/dL   Albumin 2.7 (L) 3.5 - 5.0 g/dL   AST 38 15 - 41 U/L    ALT 24 0 - 44 U/L   Alkaline Phosphatase 76  38 - 126 U/L   Total Bilirubin 0.5 0.3 - 1.2 mg/dL   GFR, Estimated >60 >60 mL/min    Comment: (NOTE) Calculated using the CKD-EPI Creatinine Equation (2021)    Anion gap 12 5 - 15    Comment: Performed at [facility] [address], [place], [place] [address]  I-stat chem 8, ED     Status: Abnormal   Collection Time: 09/10/22 11:23 AM  Result Value Ref Range   Sodium 133 (L) 135 - 145 mmol/L   Potassium 3.6 3.5 - 5.1 mmol/L   Chloride 97 (L) 98 - 111 mmol/L   BUN 19 8 - 23 mg/dL   Creatinine, Ser 0.80 0.44 - 1.00 mg/dL   Glucose, Bld 90 70 - 99 mg/dL    Comment: Glucose reference range applies only to samples taken after fasting for at least 8 hours.   Calcium, Ion 1.24 1.15 - 1.40 mmol/L   TCO2 28 22 - 32 mmol/L   Hemoglobin 10.2 (L) 12.0 - 15.0 g/dL   HCT 30.0 (L) 36.0 - 46.0 %  Resp panel by RT-PCR (RSV, Flu A&B, Covid) Anterior Nasal Swab     Status: None   Collection Time: 09/10/22 12:52 PM   Specimen: Anterior Nasal Swab  Result Value Ref Range   SARS Coronavirus 2 by RT PCR NEGATIVE NEGATIVE   Influenza A by PCR NEGATIVE NEGATIVE   Influenza B by PCR NEGATIVE NEGATIVE    Comment: (NOTE) The Xpert Xpress SARS-CoV-2/FLU/RSV plus assay is intended as an aid in the diagnosis of influenza from Nasopharyngeal swab specimens and should not be used as a sole basis for treatment. Nasal washings and aspirates are unacceptable for Xpert Xpress SARS-CoV-2/FLU/RSV testing.  Fact Sheet for Patients: EntrepreneurPulse.com.au  Fact Sheet for Healthcare Providers: IncredibleEmployment.be  This test is not yet approved or cleared by the [place] FDA and has been authorized for detection and/or diagnosis of SARS-CoV-2 by FDA under an Emergency Use Authorization (EUA). This EUA will remain in effect (meaning this test can be used) for the duration of the COVID-19 declaration under Section  564(b)(1) of the Act, 21 U.S.C. section 360bbb-3(b)(1), unless the authorization is terminated or revoked.     Resp Syncytial Virus by PCR NEGATIVE NEGATIVE    Comment: (NOTE) Fact Sheet for Patients: EntrepreneurPulse.com.au  Fact Sheet for Healthcare Providers: IncredibleEmployment.be  This test is not yet approved or cleared by the [place] FDA and has been authorized for detection and/or diagnosis of SARS-CoV-2 by FDA under an Emergency Use Authorization (EUA). This EUA will remain in effect (meaning this test can be used) for the duration of the COVID-19 declaration under Section 564(b)(1) of the Act, 21 U.S.C. section 360bbb-3(b)(1), unless the authorization is terminated or revoked.  Performed at [facility], [place] [address] [place], [place] [address]   Urine rapid drug screen (hosp performed)     Status: None   Collection Time: 09/10/22  2:02 PM  Result Value Ref Range   Opiates NONE DETECTED NONE DETECTED   Cocaine NONE DETECTED NONE DETECTED   Benzodiazepines NONE DETECTED NONE DETECTED   Amphetamines NONE DETECTED NONE DETECTED   Tetrahydrocannabinol NONE DETECTED NONE DETECTED   Barbiturates NONE DETECTED NONE DETECTED    Comment: (NOTE) DRUG SCREEN FOR MEDICAL PURPOSES ONLY.  IF CONFIRMATION IS NEEDED FOR ANY PURPOSE, NOTIFY LAB WITHIN 5 DAYS.  LOWEST DETECTABLE LIMITS FOR URINE DRUG SCREEN Drug Class  Cutoff (ng/mL) Amphetamine and metabolites    1000 Barbiturate and metabolites    200 Benzodiazepine                 200 Opiates and metabolites        300 Cocaine and metabolites        300 THC                            50 Performed at [facility], [place] [address] [place], [place] [address]   Urinalysis, Routine w reflex microscopic -Urine, Clean Catch     Status: Abnormal   Collection Time: 09/10/22  2:02 PM  Result Value Ref Range   Color, Urine COLORLESS (A) YELLOW    APPearance CLEAR CLEAR   Specific Gravity, Urine 1.020 1.005 - 1.030   pH 6.0 5.0 - 8.0   Glucose, UA 50 (A) NEGATIVE mg/dL   Hgb urine dipstick NEGATIVE NEGATIVE   Bilirubin Urine NEGATIVE NEGATIVE   Ketones, ur 5 (A) NEGATIVE mg/dL   Protein, ur NEGATIVE NEGATIVE mg/dL   Nitrite NEGATIVE NEGATIVE   Leukocytes,Ua MODERATE (A) NEGATIVE   RBC / HPF 0-5 0 - 5 RBC/hpf   WBC, UA 11-20 0 - 5 WBC/hpf   Bacteria, UA NONE SEEN NONE SEEN   Squamous Epithelial / HPF 0-5 0 - 5 /HPF    Comment: Performed at [facility], [place] [address], [place], [place] [address]   EEG adult  Result Date: 09/10/2022 Lora Havens, MD     09/10/2022  2:43 PM Patient Name: [name] [name] MRN: [id number] Epilepsy Attending: Lora Havens Referring Physician/Provider: Janine Ores, NP Date: 09/10/2022 Duration: 22.06 mins Patient history: 89 y.o. female with a past medical history of cryptogenic stroke in 2021, seizure on keppra, DM, GERD, HLD, HTN, hypothyroidism, osteoporosis, CKD, and ascites presenting unresponsive and with right gaze deviation. EEG to evaluate for seizure. Level of alertness: Awake AEDs during EEG study: LEV Technical aspects: This EEG study was done with scalp electrodes positioned according to the 10-20 International system of electrode placement. Electrical activity was reviewed with band pass filter of 1-70Hz$ , sensitivity of 7 uV/mm, display speed of 59m/sec with a 60Hz$  notched filter applied as appropriate. EEG data were recorded continuously and digitally stored.  Video monitoring was available and reviewed as appropriate. Description: The posterior dominant rhythm consists of 8 Hz activity of moderate voltage (25-35 uV) seen predominantly in posterior head regions, symmetric and reactive to eye opening and eye closing. EEG showed intermittent generalized polymorphic 3 to 6 Hz theta-delta slowing, at times with triphasic morphology..  Hyperventilation and photic stimulation were not  performed.   ABNORMALITY - Intermittent slow, generalized IMPRESSION: This study is suggestive of mild diffuse encephalopathy, nonspecific etiology.  No seizures or definite epileptiform discharges were seen throughout the recording. Please note that lack of epileptiform activity during interictal EEG does not exclude the diagnosis of epilepsy. PLora Havens  DG Chest Port 1 View  Result Date: 09/10/2022 CLINICAL DATA:  weakness EXAM: PORTABLE CHEST - 1 VIEW COMPARISON:  09/03/2022 FINDINGS: Cardiac silhouette is unremarkable. No pneumothorax or pleural effusion. The lungs are clear. Aorta is calcified. The visualized skeletal structures are unremarkable. There is an implanted cardiac monitor. IMPRESSION: No acute cardiopulmonary process. Electronically Signed   By: JSammie BenchM.D.   On: 09/10/2022 13:33   MR BRAIN WO CONTRAST  Result Date: 09/10/2022 CLINICAL DATA:  Code stroke EXAM: MRI HEAD WITHOUT CONTRAST TECHNIQUE: Multiplanar, multiecho pulse sequences of the brain and surrounding structures were obtained without intravenous contrast. COMPARISON:  None Available. FINDINGS: Brain: No acute infarction, hemorrhage, hydrocephalus, extra-axial collection or mass lesion. Sequela of moderate chronic microvascular ischemic change. Generalized volume loss. Vascular: Normal flow voids. Skull and upper cervical spine: Normal marrow signal. Sinuses/Orbits: Negative.  Bilateral lens replacement. Other: None. IMPRESSION: No acute intracranial abnormality Electronically Signed   By: Marin Roberts M.D.   On: 09/10/2022 12:44   CT ANGIO HEAD NECK W WO CM W PERF (CODE STROKE)  Result Date: 09/10/2022 CLINICAL DATA:  Neuro deficit, acute, stroke suspected EXAM: CT ANGIOGRAPHY HEAD AND NECK CT PERFUSION BRAIN TECHNIQUE: Multidetector CT imaging of the head and neck was performed using the standard protocol during bolus administration of intravenous contrast. Multiplanar CT image reconstructions and MIPs  were obtained to evaluate the vascular anatomy. Carotid stenosis measurements (when applicable) are obtained utilizing NASCET criteria, using the distal internal carotid diameter as the denominator. Multiphase CT imaging of the brain was performed following IV bolus contrast injection. Subsequent parametric perfusion maps were calculated using RAPID software. RADIATION DOSE REDUCTION: This exam was performed according to the departmental dose-optimization program which includes automated exposure control, adjustment of the mA and/or kV according to patient size and/or use of iterative reconstruction technique. COMPARISON:  None Available. FINDINGS: CTA NECK FINDINGS Aortic arch: Visualized great vessel origins are patent without significant stenosis. Atherosclerosis. Right carotid system: No evidence of dissection, stenosis (50% or greater) or occlusion. Left carotid system: No evidence of dissection, stenosis (50% or greater) or occlusion. Moderate atherosclerosis at the carotid bifurcation. Vertebral arteries: Right-dominant. No evidence of dissection, stenosis (50% or greater) or occlusion. Skeleton: Multilevel degenerative change. Other neck: No acute findings on limited assessment. Upper chest: Approximately 6 mm pulmonary nodule in the partially visualized left upper lobe. Review of the MIP images confirms the above findings CTA HEAD FINDINGS Anterior circulation: Calcific atherosclerosis of the intracranial ICAs without significant stenosis. Bilateral MCAs and ACAs are patent. Severe stenosis of a left M2 MCA branch. Posterior circulation: Bilateral intradural vertebral arteries colo basilar artery and bilateral posterior cerebral arteries are patent. Moderate right P2 PCA stenosis. Venous sinuses: Not well assessed due to arterial contrast timing. Review of the MIP images confirms the above findings CT Brain Perfusion Findings: ASPECTS: 10. CBF (<30%) Volume: 96m Perfusion (Tmax>6.0s) volume: 09mMismatch  Volume: 0 mL Infarction Location:None identified. IMPRESSION: 1. No emergent large vessel occlusion. 2. Severe left M2 MCA branch stenosis. 3. Moderate right P2 PCA stenosis. 4. Approximately 6 mm pulmonary nodule in the partially visualized left upper lobe. Non-contrast chest CT at 6-12 months is recommended. If the nodule is stable at time of repeat CT, then future CT at 18-24 months (from today's scan) is considered optional for low-risk patients, but is recommended for high-risk patients. This recommendation follows the consensus statement: Guidelines for Management of Incidental Pulmonary Nodules Detected on CT Images: From the Fleischner Society 2017; Radiology 2017; 284:228-243. Preliminary findings discussed with Dr. KiLeonel Ramsayia telephone at 11:35 a.m. Electronically Signed   By: FrMargaretha Sheffield.D.   On: 09/10/2022 11:47   CT HEAD CODE STROKE WO CONTRAST  Result Date: 09/10/2022 CLINICAL DATA:  Code stroke.  Neuro deficit, acute, stroke suspected EXAM: CT HEAD WITHOUT CONTRAST TECHNIQUE: Contiguous axial images were obtained from the base of the skull through the vertex without intravenous contrast. RADIATION DOSE REDUCTION: This exam was performed according to the  departmental dose-optimization program which includes automated exposure control, adjustment of the mA and/or kV according to patient size and/or use of iterative reconstruction technique. COMPARISON:  CT head 08/01/2022. FINDINGS: Brain: No evidence of acute large vascular territory infarction, hemorrhage, hydrocephalus, extra-axial collection or mass lesion/mass effect. Similar patchy white matter hypodensities, nonspecific but compatible with chronic microvascular ischemic disease. Similar cerebral atrophy. Vascular: No hyperdense vessel identified. Skull: No acute fracture. Sinuses/Orbits: Clear sinuses.  No acute orbital findings. Other: No sizable mastoid effusions. ASPECTS [facility] Stroke Program Early CT Score) total score  (0-10 with 10 being normal): 10. IMPRESSION: No evidence of acute intracranial abnormality. ASPECTS is 10. Code stroke imaging results were communicated on 09/10/2022 at 11:30 am to provider Dr. Leonel Ramsay via secure text paging. Electronically Signed   By: Margaretha Sheffield M.D.   On: 09/10/2022 11:30    Pending Labs Unresulted Labs (From admission, onward)     Start     Ordered   09/10/22 1449  Levetiracetam level  Add-on,   AD        09/10/22 1448   09/10/22 1408  TSH  Add-on,   AD        09/10/22 1409            Vitals/Pain Today's Vitals   09/10/22 1300 09/10/22 1315 09/10/22 1400 09/10/22 1515  BP: (!) 155/74  (!) 167/72 (!) 143/65  Pulse:  67 72 72  Resp: 12 11 13 15  $ Temp:      TempSrc:      SpO2:  100% 100% 100%  Weight:        Isolation Precautions Airborne and Contact precautions  Medications Medications  acetaminophen (TYLENOL) tablet 650 mg (has no administration in time range)  aspirin EC tablet 81 mg (has no administration in time range)  metoprolol tartrate (LOPRESSOR) tablet 25 mg (has no administration in time range)  midodrine (PROAMATINE) tablet 5 mg (has no administration in time range)  predniSONE (DELTASONE) tablet 5 mg (has no administration in time range)  famotidine (PEPCID) tablet 20 mg (has no administration in time range)  loperamide (IMODIUM) capsule 2 mg (has no administration in time range)  polyethylene glycol (MIRALAX / GLYCOLAX) packet 17 g (has no administration in time range)  folic acid (FOLVITE) tablet 1 mg (has no administration in time range)  loratadine (CLARITIN) tablet 10 mg (has no administration in time range)  Oral care mouth rinse (has no administration in time range)  Oral care mouth rinse (has no administration in time range)  0.9 %  sodium chloride infusion (has no administration in time range)  heparin injection 5,000 Units (has no administration in time range)  levETIRAcetam (KEPPRA) tablet 750 mg (has no  administration in time range)  levothyroxine (SYNTHROID) tablet 150 mcg (has no administration in time range)  insulin aspart (novoLOG) injection 0-9 Units (has no administration in time range)  fluconazole (DIFLUCAN) tablet 150 mg (has no administration in time range)  iohexol (OMNIPAQUE) 350 MG/ML injection 100 mL (100 mLs Intravenous Contrast Given 09/10/22 1137)  sodium chloride 0.9 % bolus 500 mL (0 mLs Intravenous Stopped 09/10/22 1451)    Mobility Can stand and pivot to wheelchair with assistance=     Focused Assessments Neuro Assessment Handoff:  Swallow screen pass? No    NIH Stroke Scale  Interval: Initial Level of Consciousness (1a.)   : Alert, keenly responsive LOC Questions (1b. )   : Answers neither question correctly LOC Commands (1c. )   : Performs  neither task correctly Best Gaze (2. )  : Normal Visual (3. )  : No visual loss Facial Palsy (4. )    : Minor paralysis Motor Arm, Left (5a. )   : Some effort against gravity Motor Arm, Right (5b. ) : Some effort against gravity Motor Leg, Left (6a. )  : No effort against gravity Motor Leg, Right (6b. ) : No effort against gravity Limb Ataxia (7. ): Absent Sensory (8. )  : Normal, no sensory loss Best Language (9. )  : Mute, global aphasia Dysarthria (10. ): Severe dysarthria, patient's speech is so slurred as to be unintelligible in the absence of or out of proportion to any dysphasia, or is mute/anarthric Extinction/Inattention (11.)   : No Abnormality Complete NIHSS TOTAL: 20 Last date known well: 09/08/22   Neuro Assessment: Exceptions to [facility] Neuro Checks:   Initial (09/10/22 1115)  Has TPA been given? No If patient is a Neuro Trauma and patient is going to OR before floor call report to [facility] nurse: [phone] or [phone]   R Recommendations: See Admitting Provider Note  Report given to:   Additional Notes:

## 2022-09-10 NOTE — Procedures (Signed)
Patient Name: [name] [name]  MRN: [id number]  Epilepsy Attending: Lora Havens  Referring Physician/Provider: Janine Ores, NP  Date: 09/10/2022 Duration: 22.06 mins  Patient history: 89 y.o. female with a past medical history of cryptogenic stroke in 2021, seizure on keppra, DM, GERD, HLD, HTN, hypothyroidism, osteoporosis, CKD, and ascites presenting unresponsive and with right gaze deviation. EEG to evaluate for seizure.  Level of alertness: Awake  AEDs during EEG study: LEV  Technical aspects: This EEG study was done with scalp electrodes positioned according to the 10-20 International system of electrode placement. Electrical activity was reviewed with band pass filter of 1-70Hz$ , sensitivity of 7 uV/mm, display speed of 70m/sec with a 60Hz$  notched filter applied as appropriate. EEG data were recorded continuously and digitally stored.  Video monitoring was available and reviewed as appropriate.  Description: The posterior dominant rhythm consists of 8 Hz activity of moderate voltage (25-35 uV) seen predominantly in posterior head regions, symmetric and reactive to eye opening and eye closing. EEG showed intermittent generalized polymorphic 3 to 6 Hz theta-delta slowing, at times with triphasic morphology..  Hyperventilation and photic stimulation were not performed.     ABNORMALITY - Intermittent slow, generalized  IMPRESSION: This study is suggestive of mild diffuse encephalopathy, nonspecific etiology.  No seizures or definite epileptiform discharges were seen throughout the recording.  Please note that lack of epileptiform activity during interictal EEG does not exclude the diagnosis of epilepsy.  [name] [name]

## 2022-09-10 NOTE — ED Provider Notes (Signed)
[facility] Provider Note   CSN: [id number] Arrival date & time: 09/10/22  1114     History  No chief complaint on file.   [name] [name] is a 89 y.o. female.  He is brought in by EMS for possible code stroke.  Last known well was 8:30 AM.  Family noted now she is unable to speak not seemingly aware of anything.  Level 5 caveat due to patient's decreased responsiveness.  Patient has significant medical history including dementia diabetes CKD.  Reported by EMS to be verbal at baseline.  The history is provided by the EMS personnel.  Altered Mental Status Presenting symptoms: partial responsiveness   Most recent episode:  Today Episode history:  Continuous Timing:  Constant Progression:  Unchanged Context: dementia        Home Medications Prior to Admission medications   Medication Sig Start Date End Date Taking? Authorizing Provider  acetaminophen (TYLENOL) 650 MG CR tablet Take 650 mg by mouth every 8 (eight) hours as needed for pain.    [provider]  BAYER LOW DOSE 81 MG EC tablet Take 81 mg by mouth daily. Swallow whole.    [provider]  cephALEXin (KEFLEX) 500 MG capsule Take 1 capsule (500 mg total) by mouth 4 (four) times daily. 09/03/22   Sherrill Raring, PA-C  cetirizine (ZYRTEC) 10 MG tablet Take 10 mg by mouth daily as needed (itching).    [provider]  Cholecalciferol (VITAMIN D-3 PO) Take 1,000 Units by mouth daily.    [provider]  Famotidine (PEPCID AC PO) Take 20 mg by mouth at bedtime.    [provider]  folic acid (FOLVITE) 1 MG tablet Take 1 mg by mouth at bedtime.    [provider]  insulin aspart (NOVOLOG FLEXPEN) 100 UNIT/ML FlexPen Inject 2-10 Units into the skin See admin instructions. Inject 2-10 units into the skin three times a day with meals, per sliding scale:  Breakfast: BGL 80-199 = 8 units; 200-299 = 9 units; 300 or greater = 10 units Lunch:  BGL 80-199 = 5 units; 200-299 = 6 units; 300 or greater = 7 units Supper/evening meal: BGL 80-199 = 2 units; 200-299 = 3 units; 300 or greater = 4 units    [provider]  insulin detemir (LEVEMIR FLEXTOUCH) 100 UNIT/ML FlexPen Inject 3-7 Units into the skin See admin instructions. Inject 7 units into the skin in the morning and 3 units at bedtime if BGL is 200 or below and 4 units if BGL is greater than 200    [provider]  levETIRAcetam (KEPPRA) 500 MG tablet Take 1 tablet (500 mg total) by mouth 2 (two) times daily. 10/11/19   Donzetta Starch, NP  liver oil-zinc oxide (DESITIN) 40 % ointment Apply topically 3 (three) times daily. Patient taking differently: Apply 1 Application topically as needed for irritation. 09/16/21   Lavina Hamman, MD  loperamide (IMODIUM A-D) 2 MG tablet Take 2 mg by mouth 4 (four) times daily as needed for diarrhea or loose stools.    [provider]  methotrexate (RHEUMATREX) 2.5 MG tablet Take 1 tablet (2.5 mg total) by mouth once a week. Caution:Chemotherapy. Protect from light. Patient taking differently: Take 15 mg by mouth every Saturday. Caution:Chemotherapy. Protect from light. 01/27/22   Charlynne Cousins, MD  metoprolol tartrate (LOPRESSOR) 25 MG tablet Take 1 tablet (25 mg total) by mouth 2 (two) times daily. 01/20/22  Charlynne Cousins, MD  midodrine (PROAMATINE) 5 MG tablet Take 1 tablet (5 mg total) by mouth 3 (three) times daily with meals. Patient taking differently: Take 5 mg by mouth 2 (two) times daily with a meal. 05/13/22   Cherene Altes, MD  polyethylene glycol (MIRALAX / GLYCOLAX) 17 g packet Take 17 g by mouth daily. Patient taking differently: Take 17 g by mouth daily as needed for mild constipation. 09/16/21   Lavina Hamman, MD  predniSONE (DELTASONE) 5 MG tablet Take 5 mg by mouth daily with breakfast.    [provider]  SYNTHROID 200 MCG tablet Take 200 mcg by mouth daily before breakfast. 04/23/21    [provider]  triamcinolone ointment (KENALOG) 0.1 % Apply 1 Application topically 2 (two) times daily as needed (itching, rash). 07/16/22   [provider]      Allergies    Nsaids, Biaxin [clarithromycin], Bactrim [sulfamethoxazole-trimethoprim], Lisinopril, and Sulfa antibiotics    Review of Systems   Review of Systems  Unable to perform ROS: Mental status change    Physical Exam Updated Vital Signs BP (!) 143/65   Pulse 72   Temp 97.7 F (36.5 C) (Temporal)   Resp 15   Wt 46.6 kg   SpO2 100%   BMI 16.58 kg/m  Physical Exam Vitals and nursing note reviewed.  Constitutional:      General: She is not in acute distress.    Appearance: Normal appearance. She is well-developed.  HENT:     Head: Normocephalic and atraumatic.  Eyes:     Conjunctiva/sclera: Conjunctivae normal.  Cardiovascular:     Rate and Rhythm: Normal rate and regular rhythm.     Heart sounds: No murmur heard. Pulmonary:     Effort: Pulmonary effort is normal. No respiratory distress.     Breath sounds: Normal breath sounds.  Abdominal:     Palpations: Abdomen is soft.     Tenderness: There is no abdominal tenderness. There is no guarding or rebound.  Musculoskeletal:        General: No swelling.     Cervical back: Neck supple.  Skin:    General: Skin is warm and dry.     Capillary Refill: Capillary refill takes less than 2 seconds.  Neurological:     Comments: She is awake.  She is not following commands and unable to participate in neurologic exam.  She appears to have slight facial droop on the right.  Unclear if she is blinking to confrontation or not.     ED Results / Procedures / Treatments   Labs (all labs ordered are listed, but only abnormal results are displayed) Labs Reviewed  CBC - Abnormal; Notable for the following components:      Result Value   RBC 2.87 (*)    Hemoglobin 9.5 (*)    HCT 27.6 (*)    All other components within normal limits   COMPREHENSIVE METABOLIC PANEL - Abnormal; Notable for the following components:   Sodium 134 (*)    Chloride 97 (*)    Total Protein 5.7 (*)    Albumin 2.7 (*)    All other components within normal limits  URINALYSIS, ROUTINE W REFLEX MICROSCOPIC - Abnormal; Notable for the following components:   Color, Urine COLORLESS (*)    Glucose, UA 50 (*)    Ketones, ur 5 (*)    Leukocytes,Ua MODERATE (*)    All other components within normal limits  I-STAT CHEM 8, ED -  Abnormal; Notable for the following components:   Sodium 133 (*)    Chloride 97 (*)    Hemoglobin 10.2 (*)    HCT 30.0 (*)    All other components within normal limits  RESP PANEL BY RT-PCR (RSV, FLU A&B, COVID)  RVPGX2  ETHANOL  PROTIME-INR  APTT  DIFFERENTIAL  RAPID URINE DRUG SCREEN, HOSP PERFORMED  TSH  LEVETIRACETAM LEVEL  CBG MONITORING, ED    EKG EKG Interpretation  Date/Time:  Thursday September 10 2022 12:18:58 EST Ventricular Rate:  98 PR Interval:  201 QRS Duration: 100 QT Interval:  383 QTC Calculation: 489 R Axis:   56 Text Interpretation: Sinus rhythm Nonspecific T abnormalities, lateral leads Borderline prolonged QT interval No significant change since prior 1/24 Confirmed by Aletta Edouard [phone]) on 09/10/2022 12:26:09 PM  Radiology EEG adult  Result Date: 09/10/2022 Lora Havens, MD     09/10/2022  2:43 PM Patient Name: [name] [name] MRN: [id number] Epilepsy Attending: Lora Havens Referring Physician/Provider: Janine Ores, NP Date: 09/10/2022 Duration: 22.06 mins Patient history: 89 y.o. female with a past medical history of cryptogenic stroke in 2021, seizure on keppra, DM, GERD, HLD, HTN, hypothyroidism, osteoporosis, CKD, and ascites presenting unresponsive and with right gaze deviation. EEG to evaluate for seizure. Level of alertness: Awake AEDs during EEG study: LEV Technical aspects: This EEG study was done with scalp electrodes positioned according to the 10-20 International  system of electrode placement. Electrical activity was reviewed with band pass filter of 1-70Hz$ , sensitivity of 7 uV/mm, display speed of 72m/sec with a 60Hz$  notched filter applied as appropriate. EEG data were recorded continuously and digitally stored.  Video monitoring was available and reviewed as appropriate. Description: The posterior dominant rhythm consists of 8 Hz activity of moderate voltage (25-35 uV) seen predominantly in posterior head regions, symmetric and reactive to eye opening and eye closing. EEG showed intermittent generalized polymorphic 3 to 6 Hz theta-delta slowing, at times with triphasic morphology..  Hyperventilation and photic stimulation were not performed.   ABNORMALITY - Intermittent slow, generalized IMPRESSION: This study is suggestive of mild diffuse encephalopathy, nonspecific etiology.  No seizures or definite epileptiform discharges were seen throughout the recording. Please note that lack of epileptiform activity during interictal EEG does not exclude the diagnosis of epilepsy. PLora Havens  DG Chest Port 1 View  Result Date: 09/10/2022 CLINICAL DATA:  weakness EXAM: PORTABLE CHEST - 1 VIEW COMPARISON:  09/03/2022 FINDINGS: Cardiac silhouette is unremarkable. No pneumothorax or pleural effusion. The lungs are clear. Aorta is calcified. The visualized skeletal structures are unremarkable. There is an implanted cardiac monitor. IMPRESSION: No acute cardiopulmonary process. Electronically Signed   By: JSammie BenchM.D.   On: 09/10/2022 13:33   MR BRAIN WO CONTRAST  Result Date: 09/10/2022 CLINICAL DATA:  Code stroke EXAM: MRI HEAD WITHOUT CONTRAST TECHNIQUE: Multiplanar, multiecho pulse sequences of the brain and surrounding structures were obtained without intravenous contrast. COMPARISON:  None Available. FINDINGS: Brain: No acute infarction, hemorrhage, hydrocephalus, extra-axial collection or mass lesion. Sequela of moderate chronic microvascular ischemic  change. Generalized volume loss. Vascular: Normal flow voids. Skull and upper cervical spine: Normal marrow signal. Sinuses/Orbits: Negative.  Bilateral lens replacement. Other: None. IMPRESSION: No acute intracranial abnormality Electronically Signed   By: HMarin RobertsM.D.   On: 09/10/2022 12:44   CT ANGIO HEAD NECK W WO CM W PERF (CODE STROKE)  Result Date: 09/10/2022 CLINICAL DATA:  Neuro deficit, acute, stroke suspected EXAM: CT ANGIOGRAPHY HEAD  AND NECK CT PERFUSION BRAIN TECHNIQUE: Multidetector CT imaging of the head and neck was performed using the standard protocol during bolus administration of intravenous contrast. Multiplanar CT image reconstructions and MIPs were obtained to evaluate the vascular anatomy. Carotid stenosis measurements (when applicable) are obtained utilizing NASCET criteria, using the distal internal carotid diameter as the denominator. Multiphase CT imaging of the brain was performed following IV bolus contrast injection. Subsequent parametric perfusion maps were calculated using RAPID software. RADIATION DOSE REDUCTION: This exam was performed according to the departmental dose-optimization program which includes automated exposure control, adjustment of the mA and/or kV according to patient size and/or use of iterative reconstruction technique. COMPARISON:  None Available. FINDINGS: CTA NECK FINDINGS Aortic arch: Visualized great vessel origins are patent without significant stenosis. Atherosclerosis. Right carotid system: No evidence of dissection, stenosis (50% or greater) or occlusion. Left carotid system: No evidence of dissection, stenosis (50% or greater) or occlusion. Moderate atherosclerosis at the carotid bifurcation. Vertebral arteries: Right-dominant. No evidence of dissection, stenosis (50% or greater) or occlusion. Skeleton: Multilevel degenerative change. Other neck: No acute findings on limited assessment. Upper chest: Approximately 6 mm pulmonary nodule in the  partially visualized left upper lobe. Review of the MIP images confirms the above findings CTA HEAD FINDINGS Anterior circulation: Calcific atherosclerosis of the intracranial ICAs without significant stenosis. Bilateral MCAs and ACAs are patent. Severe stenosis of a left M2 MCA branch. Posterior circulation: Bilateral intradural vertebral arteries colo basilar artery and bilateral posterior cerebral arteries are patent. Moderate right P2 PCA stenosis. Venous sinuses: Not well assessed due to arterial contrast timing. Review of the MIP images confirms the above findings CT Brain Perfusion Findings: ASPECTS: 10. CBF (<30%) Volume: 68m Perfusion (Tmax>6.0s) volume: 059mMismatch Volume: 0 mL Infarction Location:None identified. IMPRESSION: 1. No emergent large vessel occlusion. 2. Severe left M2 MCA branch stenosis. 3. Moderate right P2 PCA stenosis. 4. Approximately 6 mm pulmonary nodule in the partially visualized left upper lobe. Non-contrast chest CT at 6-12 months is recommended. If the nodule is stable at time of repeat CT, then future CT at 18-24 months (from today's scan) is considered optional for low-risk patients, but is recommended for high-risk patients. This recommendation follows the consensus statement: Guidelines for Management of Incidental Pulmonary Nodules Detected on CT Images: From the Fleischner Society 2017; Radiology 2017; 284:228-243. Preliminary findings discussed with Dr. KiLeonel Ramsayia telephone at 11:35 a.m. Electronically Signed   By: FrMargaretha Sheffield.D.   On: 09/10/2022 11:47   CT HEAD CODE STROKE WO CONTRAST  Result Date: 09/10/2022 CLINICAL DATA:  Code stroke.  Neuro deficit, acute, stroke suspected EXAM: CT HEAD WITHOUT CONTRAST TECHNIQUE: Contiguous axial images were obtained from the base of the skull through the vertex without intravenous contrast. RADIATION DOSE REDUCTION: This exam was performed according to the departmental dose-optimization program which includes  automated exposure control, adjustment of the mA and/or kV according to patient size and/or use of iterative reconstruction technique. COMPARISON:  CT head 08/01/2022. FINDINGS: Brain: No evidence of acute large vascular territory infarction, hemorrhage, hydrocephalus, extra-axial collection or mass lesion/mass effect. Similar patchy white matter hypodensities, nonspecific but compatible with chronic microvascular ischemic disease. Similar cerebral atrophy. Vascular: No hyperdense vessel identified. Skull: No acute fracture. Sinuses/Orbits: Clear sinuses.  No acute orbital findings. Other: No sizable mastoid effusions. ASPECTS (AThe Orthopaedic Surgery Centertroke Program Early CT Score) total score (0-10 with 10 being normal): 10. IMPRESSION: No evidence of acute intracranial abnormality. ASPECTS is 10. Code stroke imaging results were communicated on  09/10/2022 at 11:30 am to provider Dr. Leonel Ramsay via secure text paging. Electronically Signed   By: Margaretha Sheffield M.D.   On: 09/10/2022 11:30    Procedures .Critical Care  Performed by: Hayden Rasmussen, MD Authorized by: Hayden Rasmussen, MD   Critical care provider statement:    Critical care time (minutes):  45   Critical care time was exclusive of:  Separately billable procedures and treating other patients   Critical care was necessary to treat or prevent imminent or life-threatening deterioration of the following conditions:  CNS failure or compromise   Critical care was time spent personally by me on the following activities:  Development of treatment plan with patient or surrogate, discussions with consultants, evaluation of patient's response to treatment, examination of patient, obtaining history from patient or surrogate, ordering and performing treatments and interventions, ordering and review of laboratory studies, ordering and review of radiographic studies, pulse oximetry, re-evaluation of patient's condition and review of old charts   I assumed direction  of critical care for this patient from another provider in my specialty: no       Medications Ordered in ED Medications  acetaminophen (TYLENOL) tablet 650 mg (has no administration in time range)  aspirin EC tablet 81 mg (has no administration in time range)  metoprolol tartrate (LOPRESSOR) tablet 25 mg (has no administration in time range)  midodrine (PROAMATINE) tablet 5 mg (has no administration in time range)  predniSONE (DELTASONE) tablet 5 mg (has no administration in time range)  famotidine (PEPCID) tablet 20 mg (has no administration in time range)  loperamide (IMODIUM) capsule 2 mg (has no administration in time range)  polyethylene glycol (MIRALAX / GLYCOLAX) packet 17 g (has no administration in time range)  folic acid (FOLVITE) tablet 1 mg (has no administration in time range)  loratadine (CLARITIN) tablet 10 mg (has no administration in time range)  Oral care mouth rinse (has no administration in time range)  Oral care mouth rinse (has no administration in time range)  0.9 %  sodium chloride infusion (has no administration in time range)  heparin injection 5,000 Units (5,000 Units Subcutaneous Given 09/10/22 1603)  levETIRAcetam (KEPPRA) tablet 750 mg (750 mg Oral Given 09/10/22 1603)  levothyroxine (SYNTHROID) tablet 150 mcg (has no administration in time range)  insulin aspart (novoLOG) injection 0-9 Units (has no administration in time range)  iohexol (OMNIPAQUE) 350 MG/ML injection 100 mL (100 mLs Intravenous Contrast Given 09/10/22 1137)  sodium chloride 0.9 % bolus 500 mL (0 mLs Intravenous Stopped 09/10/22 1451)  fluconazole (DIFLUCAN) tablet 150 mg (150 mg Oral Given 09/10/22 1603)    ED Course/ Medical Decision Making/ A&P Clinical Course as of 09/10/22 1631  Thu Sep 10, 2022  1251 His daughter is here now and was able to provide more history.  Patient had a rough day yesterday with elevated blood sugars and general weakness not eating or drinking.  Did not really  want to ambulate.  Today she was speaking but wanted to rest in bed and when family checked on her later she was staring and not responding to them.  She has never had a spell like that before.  There was no observed seizure activity.  The patient herself is looking around the room now and appears more alert although still not speaking. [MB]  1252 Stroke workup has been fairly unremarkable and has been counseled by neurology.  They are still concerned about a seizure and have asked to get an EEG. [MB]  1357 discussed with Dr. Roosevelt Locks [facility] hospitalist will evaluate patient for admission. [MB]    Clinical Course User Index [MB] Hayden Rasmussen, MD                             Medical Decision Making Amount and/or Complexity of Data Reviewed Labs: ordered. Radiology: ordered.  Risk Decision regarding hospitalization.   This patient complains of decreased responsiveness not speaking; this involves an extensive number of treatment Options and is a complaint that carries with it a high risk of complications and morbidity. The differential includes stroke, bleed, hypoglycemia, seizure, metabolic derangement  I ordered, reviewed and interpreted labs, which included CBC with normal white count, hemoglobin low stable from priors, chemistries fairly unremarkable, urinalysis without clear signs of infection I ordered imaging studies which included CT head CT angio head and neck MRI brain chest x-ray and I independently    visualized and interpreted imaging which showed no acute findings Additional history obtained from EMS and patient's daughter Previous records obtained and reviewed recent visits in epic I consulted neurology Dr. Leonel Ramsay and Dr. Roosevelt Locks [facility] hospitalist and discussed lab and imaging findings and discussed disposition.  Cardiac monitoring reviewed, normal sinus rhythm Social determinants considered, no significant barriers Critical Interventions: Stroke activation bedside  management multiple reassessments and complex imaging and testing evaluation  After the interventions stated above, I reevaluated the patient and found patient seems to be a little more alert and is looking around the room and is focusing although still not speaking Admission and further testing considered, patient will need admission for further workup including EEG.         Final Clinical Impression(s) / ED Diagnoses Final diagnoses:  Altered mental status, unspecified altered mental status type  Acute encephalopathy    Rx / DC Orders ED Discharge Orders     None         Hayden Rasmussen, MD 09/10/22 [phone]

## 2022-09-10 NOTE — Evaluation (Addendum)
Clinical/Bedside Swallow Evaluation Patient Details  Name: [name] [name] MRN: [id number] Date of Birth: [birth year]  Today's Date: 09/10/2022 Time: SLP Start Time (ACUTE ONLY): 27 SLP Stop Time (ACUTE ONLY): 1330 SLP Time Calculation (min) (ACUTE ONLY): 20 min  Past Medical History:  Past Medical History:  Diagnosis Date   Arthritis    "knees, legs" (06/10/2016)   Ascites    Chronic kidney disease    "related to my diabetes"   Chronic lower back pain    Diabetes mellitus without complication (HCC)    Eczema    GERD (gastroesophageal reflux disease)    High cholesterol    Hyperlipidemia    Hypertension    Hypothyroidism    OAB (overactive bladder)    Osteoporosis    Thyroid disease    Type II diabetes mellitus ([place])    Urticaria    Past Surgical History:  Past Surgical History:  Procedure Laterality Date   ABDOMINAL HYSTERECTOMY     KNEE ARTHROSCOPY     LAPAROTOMY N/A 12/03/2016   Procedure: EXPLORATORY LAPAROTOMY, LYSIS OF ADHESIONS;  Surgeon: Armandina Gemma, MD;  Location: [facility];  Service: General;  Laterality: N/A;   LOOP RECORDER INSERTION N/A 08/23/2019   Procedure: LOOP RECORDER INSERTION;  Surgeon: Evans Lance, MD;  Location: [place] [facility];  Service: Cardiovascular;  Laterality: N/A;   vocal cord polyps     HPI:  [name] [name] is a 89 y.o. female with a past medical history of polymyositis, dysphagia, cryptogenic stroke in 2021, seizure, DM, GERD, HLD, HTN, hypothyroidism, osteoporosis, CKD, and ascites presenting unresponsive and with right gaze deviation.. Per chart she has a history of "vasovagal episodes" that present with right gaze deviation, while she is sitting or standing and she has confusion. MRI no acute intracranial abnormality. Has significant pharyngo-cervical esophageal dysphagia following MBS 07/29/22 (poor pharyngeal motility with gross retention, compromised UES opening, aspiration of thin and nectar from retention). The following day, family  desired to accept aspiration risk and pt was started on comfort feeds.    Assessment / Plan / Recommendation  Clinical Impression  Pt known to ST services from [facility] 07/29/22 with a history of aspiration and followed by Hospice. She eats at home with known aspiration risks and comfort feeds. Daughter [name] [name], reports pt wears her dentures to eat mostly regular food textures following compensatory strategies of only allowing her to eat when adequately alert, small amounts throughout the day. She reports she coughs at home mostly with thin liquids. Pt was alert, followed commands for oromotor exam without significant weakness noted. Pt did have an inconsistent immediate cough with thin liquids via teaspoon and straw that decreased when therapist limited bolus size. No significant impairments noted with applesauce boluses and solid not given as pt's dentures not present. Recommend initiate Dys 1 (puree) texture and therapist will plan to observe with higher texture once pt has her dentures (family to bring in tomorrow). Thin liquids, small sips, crush pills, sit upright and po's only when alert. [name] in agreement with plan and wishes to continue with comfort feeds for her mom. SLP Visit Diagnosis: Dysphagia, pharyngoesophageal phase (R13.14)    Aspiration Risk  Severe aspiration risk;Risk for inadequate nutrition/hydration    Diet Recommendation Dysphagia 1 (Puree);Thin liquid   Liquid Administration via: Straw;Cup Medication Administration: Crushed with puree Supervision: Staff to assist with self feeding;Full supervision/cueing for compensatory strategies Compensations: Slow rate;Small sips/bites Postural Changes: Seated upright at 90 degrees    Other  Recommendations  Oral Care Recommendations: Oral care BID    Recommendations for follow up therapy are one component of a multi-disciplinary discharge planning process, led by the attending physician.  Recommendations may be updated based on  patient status, additional functional criteria and insurance authorization.  Follow up Recommendations No SLP follow up      Assistance Recommended at Discharge    Functional Status Assessment Patient has had a recent decline in their functional status and demonstrates the ability to make significant improvements in function in a reasonable and predictable amount of time.  Frequency and Duration min 2x/week  2 weeks       Prognosis Prognosis for improved oropharyngeal function: Fair Barriers to Reach Goals: Severity of deficits      Swallow Study   General Date of Onset: 09/10/22 HPI: [name] [name] is a 89 y.o. female with a past medical history of polymyositis, dysphagia, cryptogenic stroke in 2021, seizure, DM, GERD, HLD, HTN, hypothyroidism, osteoporosis, CKD, and ascites presenting unresponsive and with right gaze deviation.. Per chart she has a history of "vasovagal episodes" that present with right gaze deviation, while she is sitting or standing and she has confusion. MRI no acute intracranial abnormality. Has significant pharyngo-cervical esophageal dysphagia following MBS 07/29/22 (poor pharyngeal motility with gross retention, compromised UES opening, aspiration of thin and nectar from retention). The following day, family desired to accept aspiration risk and pt was started on comfort feeds. Type of Study: Bedside Swallow Evaluation Previous Swallow Assessment:  (see HPI) Diet Prior to this Study: Thin liquids (Level 0);Regular Temperature Spikes Noted: No Respiratory Status: Room air History of Recent Intubation: No Behavior/Cognition: Alert;Cooperative;Pleasant mood Oral Cavity Assessment: Dried secretions Oral Care Completed by SLP: No Oral Cavity - Dentition: Dentures, not available;Edentulous (dtr will bring dentures) Vision: Functional for self-feeding Self-Feeding Abilities: Needs assist Patient Positioning: Upright in bed Baseline Vocal Quality: Low vocal  intensity Volitional Cough: Weak    Oral/Motor/Sensory Function Overall Oral Motor/Sensory Function: Within functional limits   Ice Chips Ice chips: Not tested   Thin Liquid Thin Liquid: Impaired Presentation: Cup;Straw Pharyngeal  Phase Impairments: Cough - Immediate    Nectar Thick Nectar Thick Liquid: Not tested   Honey Thick Honey Thick Liquid: Not tested   Puree Puree: Within functional limits   Solid     Solid: Not tested (eats with dentures (not present))      Mick Sell, Orbie Pyo 09/10/2022,2:28 PM

## 2022-09-10 NOTE — Progress Notes (Signed)
TSH almost undetectable, further decreased Synthyroid dosage to 100 mcg daily.

## 2022-09-10 NOTE — ED Notes (Signed)
Pt incontinent of urine; pt cleaned, brief changed

## 2022-09-10 NOTE — H&P (Signed)
History and Physical    DOROTHIE SMYLY [id number] DOB: [birth year] DOA: 09/10/2022  PCP: Charlane Ferretti, MD (Confirm with patient/family/NH records and if not entered, this has to be entered at [facility] point of entry) Patient coming from: Home  I have personally briefly reviewed patient's old medical records in [place]  Chief Complaint: Patient is confused  HPI: [name] [name] is a 89 y.o. female with medical history significant of IDDM, HTN, hypothyroidism, seizure disorder on Keppra, dementia, dermatomyositis on chronic prednisone and methotrexate, dysphagia, orthostatic hypotension, brought in by family member for evaluation of altered mentations.  Patient is confused, unable to provide any history, or history provided by daughter at bedside.  Daughter reported that the patient at baseline is active and responds to family member and can engage in short conversation.  7 days ago, patient became confused and not talking, when she was diagnosed with UTI, for which patient just completed 7 days course of Keflex last night.  Yesterday patient started to complain about new onset of vaginal itchiness, which family suspect patient had Candida infection along with antibiotic as what has happened before.  This morning, patient woke up, but not talking, asked to be stand up however complained about dizziness.  Family member checked her blood pressure was SBP 130.  And daughter reported that yesterday patient SBP 170s.  Patient has been taking 200 mcg of Synthroid chronically and recent TSH reading has been below normal limits, for which patient supposed to see PCP next month to adjust Synthroid dosage.  Patient does have chronic dysphagia and remote history of aspiration but no recent aspiration observed by family member.  Patient has loose bowel movement once a day for a long time and has lost 30 pounds in 3 months. ED Course: Afebrile, blood pressure 160/80, heart rate 100, O2 saturation 100% on  room air.  Code stroke triggered, brain MRI negative for acute findings, CTA of brain and neck showed no large vessel occlusion, severe left M2 MCA stenosis and moderate right P2 PCA stenosis.  Neurology saw patient and order EEG study.  Review of Systems: Unable to perform, patient is confused.  Past Medical History:  Diagnosis Date   Arthritis    "knees, legs" (06/10/2016)   Ascites    Chronic kidney disease    "related to my diabetes"   Chronic lower back pain    Diabetes mellitus without complication (HCC)    Eczema    GERD (gastroesophageal reflux disease)    High cholesterol    Hyperlipidemia    Hypertension    Hypothyroidism    OAB (overactive bladder)    Osteoporosis    Thyroid disease    Type II diabetes mellitus ([place])    Urticaria     Past Surgical History:  Procedure Laterality Date   ABDOMINAL HYSTERECTOMY     KNEE ARTHROSCOPY     LAPAROTOMY N/A 12/03/2016   Procedure: EXPLORATORY LAPAROTOMY, LYSIS OF ADHESIONS;  Surgeon: Armandina Gemma, MD;  Location: [facility];  Service: General;  Laterality: N/A;   LOOP RECORDER INSERTION N/A 08/23/2019   Procedure: LOOP RECORDER INSERTION;  Surgeon: Evans Lance, MD;  Location: [facility] [place];  Service: Cardiovascular;  Laterality: N/A;   vocal cord polyps       reports that she has never smoked. She quit smokeless tobacco use about 39 years ago.  Her smokeless tobacco use included chew. She reports that she does not drink alcohol and does not use drugs.  Allergies  Allergen Reactions   Nsaids Other (See Comments)    No NSAIDS due to kidney function- ESPECIALLY ibuprofen or Aleve   Biaxin [Clarithromycin] Hives   Bactrim [Sulfamethoxazole-Trimethoprim] Hives and Rash   Lisinopril Cough   Sulfa Antibiotics Hives, Rash and Other (See Comments)    NO sulfa-based meds!!     Family History  Problem Relation Age of Onset   Diabetes Mother    Hypertension Mother    Asthma Father    Diabetes Sister    Stomach cancer  Sister    Diabetes Brother    Allergic rhinitis Neg Hx    Eczema Neg Hx    Urticaria Neg Hx      Prior to Admission medications   Medication Sig Start Date End Date Taking? Authorizing Provider  acetaminophen (TYLENOL) 650 MG CR tablet Take 650 mg by mouth every 8 (eight) hours as needed for pain.    [provider]  BAYER LOW DOSE 81 MG EC tablet Take 81 mg by mouth daily. Swallow whole.    [provider]  cephALEXin (KEFLEX) 500 MG capsule Take 1 capsule (500 mg total) by mouth 4 (four) times daily. 09/03/22   Sherrill Raring, PA-C  cetirizine (ZYRTEC) 10 MG tablet Take 10 mg by mouth daily as needed (itching).    [provider]  Cholecalciferol (VITAMIN D-3 PO) Take 1,000 Units by mouth daily.    [provider]  Famotidine (PEPCID AC PO) Take 20 mg by mouth at bedtime.    [provider]  folic acid (FOLVITE) 1 MG tablet Take 1 mg by mouth at bedtime.    [provider]  insulin aspart (NOVOLOG FLEXPEN) 100 UNIT/ML FlexPen Inject 2-10 Units into the skin See admin instructions. Inject 2-10 units into the skin three times a day with meals, per sliding scale:  Breakfast: BGL 80-199 = 8 units; 200-299 = 9 units; 300 or greater = 10 units Lunch: BGL 80-199 = 5 units; 200-299 = 6 units; 300 or greater = 7 units Supper/evening meal: BGL 80-199 = 2 units; 200-299 = 3 units; 300 or greater = 4 units    [provider]  insulin detemir (LEVEMIR FLEXTOUCH) 100 UNIT/ML FlexPen Inject 3-7 Units into the skin See admin instructions. Inject 7 units into the skin in the morning and 3 units at bedtime if BGL is 200 or below and 4 units if BGL is greater than 200    [provider]  levETIRAcetam (KEPPRA) 500 MG tablet Take 1 tablet (500 mg total) by mouth 2 (two) times daily. 10/11/19   Donzetta Starch, NP  liver oil-zinc oxide (DESITIN) 40 % ointment Apply topically 3 (three) times daily. Patient taking differently: Apply 1 Application  topically as needed for irritation. 09/16/21   Lavina Hamman, MD  loperamide (IMODIUM A-D) 2 MG tablet Take 2 mg by mouth 4 (four) times daily as needed for diarrhea or loose stools.    [provider]  methotrexate (RHEUMATREX) 2.5 MG tablet Take 1 tablet (2.5 mg total) by mouth once a week. Caution:Chemotherapy. Protect from light. Patient taking differently: Take 15 mg by mouth every Saturday. Caution:Chemotherapy. Protect from light. 01/27/22   Charlynne Cousins, MD  metoprolol tartrate (LOPRESSOR) 25 MG tablet Take 1 tablet (25 mg total) by mouth 2 (two) times daily. 01/20/22   Charlynne Cousins, MD  midodrine (PROAMATINE) 5 MG tablet Take 1 tablet (5 mg total) by mouth 3 (three) times daily with meals.  Patient taking differently: Take 5 mg by mouth 2 (two) times daily with a meal. 05/13/22   Cherene Altes, MD  polyethylene glycol (MIRALAX / GLYCOLAX) 17 g packet Take 17 g by mouth daily. Patient taking differently: Take 17 g by mouth daily as needed for mild constipation. 09/16/21   Lavina Hamman, MD  predniSONE (DELTASONE) 5 MG tablet Take 5 mg by mouth daily with breakfast.    [provider]  SYNTHROID 200 MCG tablet Take 200 mcg by mouth daily before breakfast. 04/23/21   [provider]  triamcinolone ointment (KENALOG) 0.1 % Apply 1 Application topically 2 (two) times daily as needed (itching, rash). 07/16/22   [provider]    Physical Exam: Vitals:   09/10/22 1249 09/10/22 1300 09/10/22 1315 09/10/22 1400  BP:  (!) 155/74  (!) 167/72  Pulse:   67 72  Resp:  12 11 13  $ Temp: 97.7 F (36.5 C)     TempSrc: Temporal     SpO2:   100% 100%  Weight:        Constitutional: NAD, calm, comfortable Vitals:   09/10/22 1249 09/10/22 1300 09/10/22 1315 09/10/22 1400  BP:  (!) 155/74  (!) 167/72  Pulse:   67 72  Resp:  12 11 13  $ Temp: 97.7 F (36.5 C)     TempSrc: Temporal     SpO2:   100% 100%  Weight:       Eyes: PERRL, lids and  conjunctivae normal ENMT: Mucous membranes are moist. Posterior pharynx clear of any exudate or lesions.Normal dentition.  Neck: normal, supple, no masses, no thyromegaly Respiratory: clear to auscultation bilaterally, no wheezing, no crackles. Normal respiratory effort. No accessory muscle use.  Cardiovascular: Regular rate and rhythm, no murmurs / rubs / gallops. No extremity edema. 2+ pedal pulses. No carotid bruits.  Abdomen: no tenderness, no masses palpated. No hepatosplenomegaly. Bowel sounds positive.  Musculoskeletal: no clubbing / cyanosis. No joint deformity upper and lower extremities. Good ROM, no contractures. Normal muscle tone.  Skin: no rashes, lesions, ulcers. No induration Neurologic: No facial droops, moving all limbs, following simple commands Psychiatric: Awake, confused.    Labs on Admission: I have personally reviewed following labs and imaging studies  CBC: Recent Labs  Lab 09/10/22 1120 09/10/22 1123  WBC 9.2  --   NEUTROABS 7.3  --   HGB 9.5* 10.2*  HCT 27.6* 30.0*  MCV 96.2  --   PLT 260  --    Basic Metabolic Panel: Recent Labs  Lab 09/10/22 1120 09/10/22 1123  NA 134* 133*  K 3.5 3.6  CL 97* 97*  CO2 25  --   GLUCOSE 93 90  BUN 17 19  CREATININE 0.86 0.80  CALCIUM 9.4  --    GFR: Estimated Creatinine Clearance: 37.1 mL/min (by C-G formula based on SCr of 0.8 mg/dL). Liver Function Tests: Recent Labs  Lab 09/10/22 1120  AST 38  ALT 24  ALKPHOS 76  BILITOT 0.5  PROT 5.7*  ALBUMIN 2.7*   No results for input(s): "LIPASE", "AMYLASE" in the last 168 hours. No results for input(s): "AMMONIA" in the last 168 hours. Coagulation Profile: Recent Labs  Lab 09/10/22 1120  INR 1.1   Cardiac Enzymes: No results for input(s): "CKTOTAL", "CKMB", "CKMBINDEX", "TROPONINI" in the last 168 hours. BNP (last 3 results) No results for input(s): "PROBNP" in the last 8760 hours. HbA1C: No results for input(s): "HGBA1C" in the last 72  hours. CBG: Recent Labs  Lab 09/10/22 1117  GLUCAP 85   Lipid Profile: No results for input(s): "CHOL", "HDL", "LDLCALC", "TRIG", "CHOLHDL", "LDLDIRECT" in the last 72 hours. Thyroid Function Tests: No results for input(s): "TSH", "T4TOTAL", "FREET4", "T3FREE", "THYROIDAB" in the last 72 hours. Anemia Panel: No results for input(s): "VITAMINB12", "FOLATE", "FERRITIN", "TIBC", "IRON", "RETICCTPCT" in the last 72 hours. Urine analysis:    Component Value Date/Time   COLORURINE YELLOW 09/03/2022 [place] 09/03/2022 1353   LABSPEC 1.010 09/03/2022 1353   PHURINE 6.0 09/03/2022 1353   GLUCOSEU 150 (A) 09/03/2022 1353   HGBUR NEGATIVE 09/03/2022 1353   BILIRUBINUR NEGATIVE 09/03/2022 1353   BILIRUBINUR neg 05/06/2015 1421   KETONESUR 5 (A) 09/03/2022 1353   PROTEINUR 30 (A) 09/03/2022 1353   UROBILINOGEN 1.0 05/06/2015 1421   UROBILINOGEN 0.2 12/25/2014 1206   NITRITE NEGATIVE 09/03/2022 1353   LEUKOCYTESUR MODERATE (A) 09/03/2022 1353    Radiological Exams on Admission: DG Chest Port 1 View  Result Date: 09/10/2022 CLINICAL DATA:  weakness EXAM: PORTABLE CHEST - 1 VIEW COMPARISON:  09/03/2022 FINDINGS: Cardiac silhouette is unremarkable. No pneumothorax or pleural effusion. The lungs are clear. Aorta is calcified. The visualized skeletal structures are unremarkable. There is an implanted cardiac monitor. IMPRESSION: No acute cardiopulmonary process. Electronically Signed   By: Sammie Bench M.D.   On: 09/10/2022 13:33   MR BRAIN WO CONTRAST  Result Date: 09/10/2022 CLINICAL DATA:  Code stroke EXAM: MRI HEAD WITHOUT CONTRAST TECHNIQUE: Multiplanar, multiecho pulse sequences of the brain and surrounding structures were obtained without intravenous contrast. COMPARISON:  None Available. FINDINGS: Brain: No acute infarction, hemorrhage, hydrocephalus, extra-axial collection or mass lesion. Sequela of moderate chronic microvascular ischemic change. Generalized volume  loss. Vascular: Normal flow voids. Skull and upper cervical spine: Normal marrow signal. Sinuses/Orbits: Negative.  Bilateral lens replacement. Other: None. IMPRESSION: No acute intracranial abnormality Electronically Signed   By: Marin Roberts M.D.   On: 09/10/2022 12:44   CT ANGIO HEAD NECK W WO CM W PERF (CODE STROKE)  Result Date: 09/10/2022 CLINICAL DATA:  Neuro deficit, acute, stroke suspected EXAM: CT ANGIOGRAPHY HEAD AND NECK CT PERFUSION BRAIN TECHNIQUE: Multidetector CT imaging of the head and neck was performed using the standard protocol during bolus administration of intravenous contrast. Multiplanar CT image reconstructions and MIPs were obtained to evaluate the vascular anatomy. Carotid stenosis measurements (when applicable) are obtained utilizing NASCET criteria, using the distal internal carotid diameter as the denominator. Multiphase CT imaging of the brain was performed following IV bolus contrast injection. Subsequent parametric perfusion maps were calculated using RAPID software. RADIATION DOSE REDUCTION: This exam was performed according to the departmental dose-optimization program which includes automated exposure control, adjustment of the mA and/or kV according to patient size and/or use of iterative reconstruction technique. COMPARISON:  None Available. FINDINGS: CTA NECK FINDINGS Aortic arch: Visualized great vessel origins are patent without significant stenosis. Atherosclerosis. Right carotid system: No evidence of dissection, stenosis (50% or greater) or occlusion. Left carotid system: No evidence of dissection, stenosis (50% or greater) or occlusion. Moderate atherosclerosis at the carotid bifurcation. Vertebral arteries: Right-dominant. No evidence of dissection, stenosis (50% or greater) or occlusion. Skeleton: Multilevel degenerative change. Other neck: No acute findings on limited assessment. Upper chest: Approximately 6 mm pulmonary nodule in the partially visualized left  upper lobe. Review of the MIP images confirms the above findings CTA HEAD FINDINGS Anterior circulation: Calcific atherosclerosis of the intracranial ICAs without significant stenosis. Bilateral MCAs and ACAs are patent. Severe stenosis  of a left M2 MCA branch. Posterior circulation: Bilateral intradural vertebral arteries colo basilar artery and bilateral posterior cerebral arteries are patent. Moderate right P2 PCA stenosis. Venous sinuses: Not well assessed due to arterial contrast timing. Review of the MIP images confirms the above findings CT Brain Perfusion Findings: ASPECTS: 10. CBF (<30%) Volume: 53m Perfusion (Tmax>6.0s) volume: 067mMismatch Volume: 0 mL Infarction Location:None identified. IMPRESSION: 1. No emergent large vessel occlusion. 2. Severe left M2 MCA branch stenosis. 3. Moderate right P2 PCA stenosis. 4. Approximately 6 mm pulmonary nodule in the partially visualized left upper lobe. Non-contrast chest CT at 6-12 months is recommended. If the nodule is stable at time of repeat CT, then future CT at 18-24 months (from today's scan) is considered optional for low-risk patients, but is recommended for high-risk patients. This recommendation follows the consensus statement: Guidelines for Management of Incidental Pulmonary Nodules Detected on CT Images: From the Fleischner Society 2017; Radiology 2017; 284:228-243. Preliminary findings discussed with Dr. KiLeonel Ramsayia telephone at 11:35 a.m. Electronically Signed   By: FrMargaretha Sheffield.D.   On: 09/10/2022 11:47   CT HEAD CODE STROKE WO CONTRAST  Result Date: 09/10/2022 CLINICAL DATA:  Code stroke.  Neuro deficit, acute, stroke suspected EXAM: CT HEAD WITHOUT CONTRAST TECHNIQUE: Contiguous axial images were obtained from the base of the skull through the vertex without intravenous contrast. RADIATION DOSE REDUCTION: This exam was performed according to the departmental dose-optimization program which includes automated exposure control,  adjustment of the mA and/or kV according to patient size and/or use of iterative reconstruction technique. COMPARISON:  CT head 08/01/2022. FINDINGS: Brain: No evidence of acute large vascular territory infarction, hemorrhage, hydrocephalus, extra-axial collection or mass lesion/mass effect. Similar patchy white matter hypodensities, nonspecific but compatible with chronic microvascular ischemic disease. Similar cerebral atrophy. Vascular: No hyperdense vessel identified. Skull: No acute fracture. Sinuses/Orbits: Clear sinuses.  No acute orbital findings. Other: No sizable mastoid effusions. ASPECTS ([facility] Program Early CT Score) total score (0-10 with 10 being normal): 10. IMPRESSION: No evidence of acute intracranial abnormality. ASPECTS is 10. Code stroke imaging results were communicated on 09/10/2022 at 11:30 am to provider Dr. KiLeonel Ramsayia secure text paging. Electronically Signed   By: FrMargaretha Sheffield.D.   On: 09/10/2022 11:30    EKG: Independently reviewed.  Sinus rhythm, no acute ST changes.  Assessment/Plan Principal Problem:   AMS (altered mental status) Active Problems:   Acute encephalopathy   History of seizure   Dementia without behavioral disturbance (HCC)  (please populate well all problems here in Problem List. (For example, if patient is on BP meds at home and you resume or decide to hold them, it is a problem that needs to be her. Same for CAD, COPD, HLD and so on)  Acute encephalopathy -Rule out breakthrough seizure -EEG pending -Family does report that the patient sleeps cycle has been irregular, and as a result she sometimes takes daily two doses of Keppra in the 8-hour interval instead of 10 to 12 hours interval.  Will check Keppra level. -Other DDx, UTI unlikely as patient just completed Keflex 7 days yesterday -Other Ddx, clinically suspect patient now has iatrogenic hyperthyroidism as her TSH significantly decreased on recent blood work on 09/03/2022.  I  recommend cut down her Synthroid dosage from 200 mcg to 150 mcg and recheck TSH in 5 to 6 weeks, daughter at bedside agreed.  Vaginal candidiasis -Likely related to recent antibiotic usage, 1 dose of p.o. Diflucan ordered  IDDM -Daughter  reported hyperglycemia the day before yesterday, today's glucose= 97, -Will hold off home insulin regimen, start patient on sliding scale.  Hypothyroidism -Adjust Synthroid doses as above  History of dermatomyositis -Reasonably controlled as per daughter, intermittent bilateral shoulder and bilateral knee pain -Continue steroid and weekly methotrexate  HTN with history of orthostatic hypotension -Continue home BP meds and home dose of midodrine   DVT prophylaxis: Heparin subcu Code Status: dNR Family Communication: Daughter at bedside Disposition Plan: Expect less than 2 midnight hospital stay Consults called: Neuro Admission status: Tele obs   Lequita Halt MD [facility] Pager [phone]  09/10/2022, 2:16 PM

## 2022-09-10 NOTE — Evaluation (Signed)
Physical Therapy Evaluation Patient Details Name: [name] [name] MRN: [id number] DOB: September 17, 1933 Today's Date: 09/10/2022  History of Present Illness  Pt is an 89 y.o. female who presented 09/10/22 with AMS and right gaze deviation. MRI negative for acute intracranial abnormality. Of note, pt recently finished 7 days course of Keflex for UTI. Admitted with acute encephalopathy. PMH: cryptogenic stroke in 2021, seizure on keppra, DM, GERD, HLD, HTN, hypothyroidism, osteoporosis, CKD, dementia, and ascites   Clinical Impression  Pt presents with condition above and deficits mentioned below, see PT Problem List. PT minimally verbally responding to questions, only ~20% of time, and no family present to confirm PLOF or home set-up. Information obtained from chart review and carried over from prior entry 07/28/22 revealed pt has been living with her daughters in a 1-level house with 2 STE and she was receiving assistance for ADLs and household ambulation using a RW or w/c. Currently, pt is demonstrating deficits in communication, cognition, balance, overall strength, power, and activity tolerance. She required maxA for bed mobility and to take x1 step along EOB with HHA and modA to transfer to stand, demonstrating a posterior bias. She is at high risk for falls. If family can provide the level of care pt needs, she could d/c home with family support and HHPT. However, if they cannot, then recommend short-term rehab at a SNF. Will continue to follow acutely.     Recommendations for follow up therapy are one component of a multi-disciplinary discharge planning process, led by the attending physician.  Recommendations may be updated based on patient status, additional functional criteria and insurance authorization.  Follow Up Recommendations Skilled nursing-short term rehab (<3 hours/day) (can go home with HHPT if family can provide level of care pt needs) Can patient physically be transported by private  vehicle: No    Assistance Recommended at Discharge Frequent or constant Supervision/Assistance  Patient can return home with the following  Two people to help with walking and/or transfers;A lot of help with bathing/dressing/bathroom;Assistance with cooking/housework;Direct supervision/assist for medications management;Direct supervision/assist for financial management;Assist for transportation;Help with stairs or ramp for entrance    Equipment Recommendations Hospital bed;Rolling walker (2 wheels);BSC/3in1;Wheelchair (measurements PT);Wheelchair cushion (measurements PT) (pending progress and DME at home)  Recommendations for Other Services  OT consult    Functional Status Assessment Patient has had a recent decline in their functional status and demonstrates the ability to make significant improvements in function in a reasonable and predictable amount of time.     Precautions / Restrictions Precautions Precautions: Fall Restrictions Weight Bearing Restrictions: No      Mobility  Bed Mobility Overal bed mobility: Needs Assistance Bed Mobility: Supine to Sit, Sit to Supine     Supine to sit: Max assist, HOB elevated Sit to supine: Max assist, HOB elevated   General bed mobility comments: Poor initiation by pt, needing maxA to manage trunk and legs    Transfers Overall transfer level: Needs assistance Equipment used: 1 person hand held assist Transfers: Sit to/from Stand Sit to Stand: Mod assist           General transfer comment: Pt pulling up on therapist with face-to-face approach, modA to stand from EOB with posterior lean and flexed posture    Ambulation/Gait Ambulation/Gait assistance: Max assist Gait Distance (Feet): 1 Feet Assistive device: 1 person hand held assist Gait Pattern/deviations: Step-to pattern, Decreased step length - right, Decreased step length - left, Decreased stride length, Decreased weight shift to right, Decreased weight shift to  left,  Trunk flexed, Leaning posteriorly Gait velocity: reduced Gait velocity interpretation: <1.31 ft/sec, indicative of household ambulator   General Gait Details: Pt with posterior lean and flexed posture with bil UE support on therapist with face-to-face approach. MaxA for stability, weight shifting pt, and sliding pt's feet laterally to step along EOB as pt unable to initiate on her own.  Stairs            Wheelchair Mobility    Modified Rankin (Stroke Patients Only) Modified Rankin (Stroke Patients Only) Pre-Morbid Rankin Score: Moderately severe disability Modified Rankin: Severe disability     Balance Overall balance assessment: Needs assistance Sitting-balance support: Feet supported, Bilateral upper extremity supported Sitting balance-Leahy Scale: Poor Sitting balance - Comments: Posterior lean, needing min-modA for static sitting balance Postural control: Posterior lean Standing balance support: Bilateral upper extremity supported, During functional activity Standing balance-Leahy Scale: Poor Standing balance comment: Reliant on UE support and mod-maxA for standing balance                             Pertinent Vitals/Pain Pain Assessment Pain Assessment: Faces Faces Pain Scale: No hurt Pain Intervention(s): Monitored during session    Home Living Family/patient expects to be discharged to:: Private residence Living Arrangements: Children Available Help at Discharge: Family;Available 24 hours/day Type of Home: House Home Access: Stairs to enter Entrance Stairs-Rails: None Entrance Stairs-Number of Steps: 2   Home Layout: One level Home Equipment: Conservation officer, nature (2 wheels);Shower seat - built in;Wheelchair - manual;BSC/3in1 Additional Comments: Pt is never alone, as she stays with one of her daughter's a couple days out of the week & her other daughter comes to the pt's home and stays the other days. (all info of PLOF and home was carried over from  entry 07/28/22 as pt minimally verbal and no family present to confirm)    Prior Function Prior Level of Function : Needs assist (all info of PLOF and home was carried over from entry 07/28/22 as pt minimally verbal and no family present to confirm)             Mobility Comments: Family assists with mobility for safety; pt uses a RW for household ambulation (shorter distances recently) ADLs Comments: Pt requires assist for bathing, dressing,  and toileting hygiene from daughters; her daughters also manage the cooking and cleaning. Pt does not drive.     Hand Dominance        Extremity/Trunk Assessment   Upper Extremity Assessment Upper Extremity Assessment: Defer to OT evaluation    Lower Extremity Assessment Lower Extremity Assessment: Generalized weakness;Difficult to assess due to impaired cognition    Cervical / Trunk Assessment Cervical / Trunk Assessment: Kyphotic  Communication   Communication: Expressive difficulties (minimally verbal with soft speech today)  Cognition Arousal/Alertness: Awake/alert, Lethargic Behavior During Therapy: Flat affect Overall Cognitive Status: History of cognitive impairments - at baseline                                 General Comments: Chart reports pt has hx of dementia. No family present to confirm baseline, but pt appears to likely be below her baseline as she is minimally verbal and only stating her birth month and her name when asking orientation questions. Intermittently answers other questions, ~20% of time. Slow to process cues with poor sequencing.        General  Comments      Exercises     Assessment/Plan    PT Assessment Patient needs continued PT services  PT Problem List Decreased strength;Decreased activity tolerance;Decreased balance;Decreased mobility;Decreased coordination;Decreased cognition;Decreased safety awareness       PT Treatment Interventions DME instruction;Gait training;Functional  mobility training;Stair training;Therapeutic activities;Therapeutic exercise;Balance training;Neuromuscular re-education;Cognitive remediation;Wheelchair mobility training;Patient/family education    PT Goals (Current goals can be found in the Care Plan section)  Acute Rehab PT Goals Patient Stated Goal: did not state PT Goal Formulation: With patient Time For Goal Achievement: 09/24/22 Potential to Achieve Goals: Fair    Frequency Min 3X/week     Co-evaluation               AM-PAC PT "6 Clicks" Mobility  Outcome Measure Help needed turning from your back to your side while in a flat bed without using bedrails?: A Lot Help needed moving from lying on your back to sitting on the side of a flat bed without using bedrails?: A Lot Help needed moving to and from a bed to a chair (including a wheelchair)?: A Lot Help needed standing up from a chair using your arms (e.g., wheelchair or bedside chair)?: A Lot Help needed to walk in hospital room?: Total Help needed climbing 3-5 steps with a railing? : Total 6 Click Score: 10    End of Session Equipment Utilized During Treatment: Gait belt Activity Tolerance: Patient tolerated treatment well Patient left: in bed;with call bell/phone within reach;with bed alarm set;with nursing/sitter in room Nurse Communication: Mobility status PT Visit Diagnosis: Unsteadiness on feet (R26.81);Other abnormalities of gait and mobility (R26.89);Muscle weakness (generalized) (M62.81);Difficulty in walking, not elsewhere classified (R26.2)    Time: 1715-1730 PT Time Calculation (min) (ACUTE ONLY): 15 min   Charges:   PT Evaluation $PT Eval Moderate Complexity: 1 Mod          Moishe Spice, PT, DPT Acute Rehabilitation Services  Office: [phone]   Orvan Falconer 09/10/2022, 5:51 PM

## 2022-09-10 NOTE — Progress Notes (Signed)
EEG complete - results pending 

## 2022-09-11 ENCOUNTER — Other Ambulatory Visit (HOSPITAL_COMMUNITY): Payer: Self-pay

## 2022-09-11 DIAGNOSIS — R4182 Altered mental status, unspecified: Secondary | ICD-10-CM | POA: Diagnosis not present

## 2022-09-11 DIAGNOSIS — G9340 Encephalopathy, unspecified: Secondary | ICD-10-CM | POA: Diagnosis not present

## 2022-09-11 DIAGNOSIS — G40919 Epilepsy, unspecified, intractable, without status epilepticus: Secondary | ICD-10-CM | POA: Diagnosis not present

## 2022-09-11 LAB — GLUCOSE, CAPILLARY
Glucose-Capillary: 354 mg/dL — ABNORMAL HIGH (ref 70–99)
Glucose-Capillary: 276 mg/dL — ABNORMAL HIGH (ref 70–99)

## 2022-09-11 LAB — LEVETIRACETAM LEVEL: Levetiracetam Lvl: 48.6 ug/mL — ABNORMAL HIGH (ref 10.0–40.0)

## 2022-09-11 MED ORDER — LEVOTHYROXINE SODIUM 100 MCG PO TABS
100.0000 ug | ORAL_TABLET | Freq: Every day | ORAL | 0 refills | Status: DC
  Filled 2022-09-11: qty 30, 30d supply, fill #0

## 2022-09-11 MED ORDER — LEVETIRACETAM 100 MG/ML PO SOLN
750.0000 mg | Freq: Two times a day (BID) | ORAL | 0 refills | Status: DC
  Filled 2022-09-11: qty 473, 32d supply, fill #0

## 2022-09-11 MED ORDER — INSULIN DETEMIR 100 UNIT/ML ~~LOC~~ SOLN
5.0000 [IU] | Freq: Every day | SUBCUTANEOUS | Status: DC
  Filled 2022-09-11: qty 0.05

## 2022-09-11 MED ORDER — LEVETIRACETAM 750 MG PO TABS
750.0000 mg | ORAL_TABLET | Freq: Two times a day (BID) | ORAL | 0 refills | Status: DC
  Filled 2022-09-11: qty 60, 30d supply, fill #0

## 2022-09-11 MED ORDER — MEDIHONEY WOUND/BURN DRESSING EX PSTE
1.0000 | PASTE | Freq: Every day | CUTANEOUS | Status: DC
  Administered 2022-09-11: 1 via TOPICAL
  Filled 2022-09-11: qty 44

## 2022-09-11 NOTE — Care Management Obs Status (Signed)
[place] NOTIFICATION   Patient Details  Name: [name] [name] MRN: [id number] Date of Birth: 1933-12-04   Medicare Observation Status Notification Given:  Yes    Pollie Friar, RN 09/11/2022, 1:31 PM

## 2022-09-11 NOTE — Progress Notes (Signed)
Neurology Progress Note  Brief HPI:  [name] [name] is a 89 y.o. female with a past medical history of cryptogenic stroke in 2021, seizure on keppra, DM, GERD, HLD, HTN, hypothyroidism, osteoporosis, CKD, and ascites presenting unresponsive and with right gaze deviation. Per daughters she was talking this morning, but she did not get out of bed yesterday which is abnormal for her. They report a couple of days of increasing fatigue. At baseline she does occasionally get up into her wheelchair, but generally she needs help with ADLs. Additionally she has a history of "vasovagal episodes" that present with right gaze deviation, while she is sitting or standing and she has confusion after. Her daughter states that this episode was longer than normal. She is compliant with her medications including keppra and ASA.    Subjective: Patient seen in room. She is sleeping. She is currently on a mechanical soft diet with thins, she is an aspiration risk and is on comfort feeds at home. After some stimulation and her daughters arrival she is more awake and intermittently speaking. She is able to feed herself while we are in the room and initially has trouble with her fork, but is able to self correct.   Exam: Vitals:   09/11/22 0846 09/11/22 1211  BP: (!) 145/52 (!) 143/64  Pulse: 68 (!) 54  Resp: 16 16  Temp: 97.6 F (36.4 C) (!) 97.4 F (36.3 C)  SpO2: 100% 100%   Gen: In bed, NAD Resp: non-labored breathing, no acute distress Abd: soft, nt  NEURO:  Mental Status: Awake, alert to self, eyes open. Participation is limited due to cooperation.  Language: speech is sparse, but fluent. Exam limited by cooperation  Cranial Nerves: PERRL. eyes are midline, face is asymmetric while asleep but symmetric while awake. Voice is normal, hard of hearing at baseline. Tongue protrudes midline Motor: Moves all extremities antigravity Sensation- withdraws to painful stimuli in all extremities Coordination: No  obvious ataxia on exam, able to use her fork to eat without difficulty Gait- deferred  Pertinent Labs: TSH- 0.069 -> synthroid adjusted by primary team  Imaging Reviewed: EEG- 09/10/2022 This study is suggestive of mild diffuse encephalopathy, nonspecific etiology.  No seizures or definite epileptiform discharges were seen throughout the recording.  MRI No acute intracranial abnormality  CT-head No evidence of acute intracranial abnormality. ASPECTS is 10.    CTA Head and Neck 1. No emergent large vessel occlusion. 2. Severe left M2 MCA branch stenosis. 3. Moderate right P2 PCA stenosis.  Assessment:  89 y.o. female past medical history of cryptogenic stroke in 2021, seizure on keppra, dermatomyositis, DM, GERD, HLD, HTN, hypothyroidism, osteoporosis, CKD, and ascites presenting unresponsive and with right gaze deviation. Her history of vasovagal episodes with confusion and gaze deviation is concerning for seizures. MRI was negative for acute infarct.  EEG was negative for active seizure, but we will increase her keppra to '750mg'$  BID. She has has a similar presentation in 2021 and was given thrombolytics at that time, however her MRI was negative and that is when she was started on keppra initially.   Impression:  Breakthrough seizure  Recommendations: - Continue Keppra '750mg'$  BID. Adjust as needed for renal function:  Estimated Creatinine Clearance: 37.1 mL/min (by C-G formula based on SCr of 0.8 mg/dL).   CrCl 80 to 130 mL/minute/1.73 m2: 500 mg to 1.5 g every 12 hours.  CrCl 50 to <80 mL/minute/1.73 m2: 500 mg to 1 g every 12 hours.  CrCl 30 to <50  mL/minute/1.73 m2: 250 to 750 mg every 12 hours.  CrCl 15 to <30 mL/minute/1.73 m2: 250 to 500 mg every 12 hours.  CrCl <15 mL/minute/1.73 m2: 250 to 500 mg every 24 hours (expert opinion). - Neurology will be available as needed, reach out with any questions or concerns - Please include seizure precautions in discharge instructions  (family already providing support appropriate for precautions)  Standard seizure precautions: Per [facility] statutes, patients with seizures are not allowed to drive until  they have been seizure-free for six months. Use caution when using heavy equipment or power tools. Avoid working on ladders or at heights. Take showers instead of baths. Ensure the water temperature is not too high on the home water heater. Do not go swimming alone. When caring for infants or small children, sit down when holding, feeding, or changing them to minimize risk of injury to the child in the event you have a seizure.  To reduce risk of seizures, maintain good sleep hygiene avoid alcohol and illicit drug use, take all anti-seizure medications as prescribed.   Patient seen and examined by NP/APP with MD. MD to update note as needed.   Janine Ores, DNP, FNP-BC [facility] Pager: [phone]  Attending Neurologist's note:  I personally saw this patient, gathering history, performing a full neurologic examination, reviewing relevant labs, personally reviewing relevant imaging, and formulated the assessment and plan, adding the note above for completeness and clarity to accurately reflect my thoughts   Lesleigh Noe MD-PhD [facility] [phone]

## 2022-09-11 NOTE — TOC Transition Note (Signed)
Transition of Care [facility]) - CM/SW Discharge Note   Patient Details  Name: [name] [name] MRN: [id number] Date of Birth: Mar 16, 1934  [facility] CM/SW Contact:  Pollie Friar, RN Phone Number: 09/11/2022, 1:43 PM   Clinical Narrative:    CM met with the patient and her daughter at the bedside. Per daughter, she and her sister provide needed care at home. They provide needed transportation and manage her medications. She has: cane/ walker/ shower seat/ transport chair/ bed rails/ wheelchair CM inquired about HH and they decline these services. Family plans to have her d/c home and continue with the current care at home.  Family will transport home when medically ready.   Final next level of care: Home/Self Care Barriers to Discharge: No Barriers Identified   Patient Goals and CMS Choice      Discharge Placement                         Discharge Plan and Services Additional resources added to the After Visit Summary for                                       Social Determinants of Health (SDOH) Interventions SDOH Screenings   Food Insecurity: No Food Insecurity (07/28/2022)  Housing: Low Risk  (07/28/2022)  Transportation Needs: No Transportation Needs (07/28/2022)  Utilities: Not At Risk (07/28/2022)  Tobacco Use: Medium Risk (09/03/2022)     Readmission Risk Interventions    01/19/2022   11:11 AM  Readmission Risk Prevention Plan  Transportation Screening Complete  PCP or Specialist Appt within 3-5 Days Complete  HRI or [place] Complete  Social Work Consult for [place] Planning/Counseling Complete  Palliative Care Screening Not Applicable  Medication Review Press photographer) Complete

## 2022-09-11 NOTE — Progress Notes (Signed)
Speech Language Pathology Treatment: Dysphagia  Patient Details Name: ANTANIQUE GIUDICI MRN: [id number] DOB: 1934-06-22 Today's Date: 09/11/2022 Time: 0947-1000 SLP Time Calculation (min) (ACUTE ONLY): 13 min  Assessment / Plan / Recommendation Clinical Impression  Pt seen for dysphagia tx with daughter at bedside who brought pt's dentures from home, cleaned and donned for potential texture upgrade. She consumed straw sips thin water and bites saltine cracker with intermittent delayed coughs x 2. Pt is a known aspirator who is followed by Hospice, on comfort feeds of mostly regular texture. Family accepted aspiration risk following MBS last month and continue to desire for pt to eat/drink. She was able to masticate regular texture timely however daughter prefers chopped meats. Daughter appropriately cues pt to remember to take small sips which she needs help to remove straw from oral cavity at times. SLP will order Dys 3 (chopped meats) per family request, thin liquids, straws allowed and crush pills in puree. ST will sign off at this time and daughter is in agreement with plan. Reviewed menu with daughter to order food for pt if desires and educated RN re: pt's intermittent coughing and known aspiration risk (signs hung in room).    HPI HPI: MIAJA NORCOTT is a 89 y.o. female with a past medical history of polymyositis, dysphagia, cryptogenic stroke in 2021, seizure, DM, GERD, HLD, HTN, hypothyroidism, osteoporosis, CKD, and ascites presenting unresponsive and with right gaze deviation.. Per chart she has a history of "vasovagal episodes" that present with right gaze deviation, while she is sitting or standing and she has confusion. MRI no acute intracranial abnormality. Has significant pharyngo-cervical esophageal dysphagia following MBS 07/29/22 (poor pharyngeal motility with gross retention, compromised UES opening, aspiration of thin and nectar from retention). The following day, family desired to  accept aspiration risk and pt was started on comfort feeds.      SLP Plan  All goals met;Discharge SLP treatment due to (comment)      Recommendations for follow up therapy are one component of a multi-disciplinary discharge planning process, led by the attending physician.  Recommendations may be updated based on patient status, additional functional criteria and insurance authorization.    Recommendations  Diet recommendations: Dysphagia 3 (mechanical soft);Thin liquid Liquids provided via: Straw;Cup Medication Administration: Crushed with puree Supervision: Staff to assist with self feeding;Full supervision/cueing for compensatory strategies Compensations: Slow rate;Small sips/bites Postural Changes and/or Swallow Maneuvers: Seated upright 90 degrees                Oral Care Recommendations: Oral care BID Follow Up Recommendations: No SLP follow up Assistance recommended at discharge: None SLP Visit Diagnosis: Dysphagia, pharyngoesophageal phase (R13.14) Plan: All goals met;Discharge SLP treatment due to (comment)           Houston Siren  09/11/2022, 10:09 AM

## 2022-09-11 NOTE — Code Documentation (Signed)
Stroke Response Nurse Documentation Code Documentation  Tonye DUBLIN HIGHSMITH is a 89 y.o. female arriving to [facility]  via [facility] on 09/10/2022 with past medical hx of Seizure, HTN, DM, CKD. Code stroke was activated by EMS.   Patient from home where she was reported to have a sudden onset of unresponsiveness and right gaze per family, but she had not been out of the bed since Tuesday. Last Known Well would be Tuesday, 2/20 per MD Leonel Ramsay. This morning, pt was talking with family and started having trouble speaking at 0830 and then would not respond. EMS called and activated a Code Stroke.   Stroke team at the bedside on patient arrival. Labs drawn and patient cleared for CT by Dr. Melina Copa. Patient to CT with team. NIHSS 20, see documentation for details and code stroke times. Patient with disoriented, not following commands, right facial droop, bilateral arm weakness, bilateral leg weakness, Global aphasia , and dysarthria  on exam. The following imaging was completed:  CT Head, CTA, CTP, and MRI. Patient is not a candidate for IV Thrombolytic due to being outside window and stroke not suspected. Patient is not a candidate for IR due to no LVO noted on imaging.   Bedside handoff with ED RN Hannie.    Kathrin Greathouse  Stroke Response RN

## 2022-09-11 NOTE — Progress Notes (Signed)
Physical Therapy Treatment Patient Details Name: [name] [name] MRN: [id number] DOB: 09/26/1933 Today's Date: 09/11/2022   History of Present Illness Pt is an 89 y.o. female who presented 09/10/22 with AMS and right gaze deviation. MRI negative for acute intracranial abnormality. Of note, pt recently finished 7 days course of Keflex for UTI. Admitted with acute encephalopathy. PMH: cryptogenic stroke in 2021, seizure on keppra, DM, GERD, HLD, HTN, hypothyroidism, osteoporosis, CKD, dementia, and ascites    PT Comments    Pt demonstrated improved initiation and active participation today along with more clear verbal communication. She does display gross weakness that results in her requiring up to modAx2 for bed mobility, transfers, and to take a few side steps along EOB with a RW today. [name] present, reporting pt is presenting closer to baseline but still seems to be weaker than normal at this time. Educated pt's daughter on pressure relief techniques, rolling schedules, and use of prevalon boots. Daughter confirmed they have 24/7 care available for pt and they can physically manage her at home and prefer to take her home with hospice. She confirmed they have a ramped entrance to the home and have all needed DME. Updated d/c recs to reflect this information. Will continue to follow acutely.     Recommendations for follow up therapy are one component of a multi-disciplinary discharge planning process, led by the attending physician.  Recommendations may be updated based on patient status, additional functional criteria and insurance authorization.  Follow Up Recommendations  No PT follow up (plans to d/c home with hospice) Can patient physically be transported by private vehicle: Yes   Assistance Recommended at Discharge Frequent or constant Supervision/Assistance  Patient can return home with the following Two people to help with walking and/or transfers;A lot of help with  bathing/dressing/bathroom;Assistance with cooking/housework;Direct supervision/assist for medications management;Direct supervision/assist for financial management;Assist for transportation;Help with stairs or ramp for entrance;Assistance with feeding   Equipment Recommendations  None recommended by PT (daughter has all needed DME)    Recommendations for Other Services       Precautions / Restrictions Precautions Precautions: Fall Restrictions Weight Bearing Restrictions: No     Mobility  Bed Mobility Overal bed mobility: Needs Assistance Bed Mobility: Supine to Sit, Sit to Supine     Supine to sit: Mod assist, +2 for physical assistance, +2 for safety/equipment, HOB elevated Sit to supine: HOB elevated, Mod assist, +2 for safety/equipment, +2 for physical assistance   General bed mobility comments: Improved initiation, but benefitting from +2 assist for safety, assist required at legs and trunk in and out of bed    Transfers Overall transfer level: Needs assistance Equipment used: Rolling walker (2 wheels) Transfers: Sit to/from Stand Sit to Stand: Min assist, +2 physical assistance, +2 safety/equipment, Mod assist           General transfer comment: Min to Mod A of 2 to come into standing, posterior lean noted throughout and flexed knees. Able to take a few steps to [facility] with mod A of 2 and PT/OT advancing RW    Ambulation/Gait Ambulation/Gait assistance: Mod assist, +2 safety/equipment, +2 physical assistance Gait Distance (Feet): 2 Feet Assistive device: Rolling walker (2 wheels) Gait Pattern/deviations: Step-to pattern, Decreased step length - right, Decreased step length - left, Decreased stride length, Decreased weight shift to left, Trunk flexed, Leaning posteriorly, Decreased weight shift to right, Knee flexed in stance - left, Knee flexed in stance - right Gait velocity: reduced Gait velocity interpretation: <1.31 ft/sec,  indicative of household ambulator    General Gait Details: posterior lean noted throughout and flexed knees, needing modAx2 for balance and to cue weight shifting to step laterally along EOB to HOB. Assistance for RW management.   Stairs             Wheelchair Mobility    Modified Rankin (Stroke Patients Only) Modified Rankin (Stroke Patients Only) Pre-Morbid Rankin Score: Moderately severe disability Modified Rankin: Moderately severe disability     Balance Overall balance assessment: Needs assistance Sitting-balance support: Feet supported, Bilateral upper extremity supported Sitting balance-Leahy Scale: Poor Sitting balance - Comments: Posterior lean, needing min-modA for static sitting balance Postural control: Posterior lean Standing balance support: Bilateral upper extremity supported, During functional activity Standing balance-Leahy Scale: Poor Standing balance comment: Reliant on UE support and physical assistance for standing balance                            Cognition Arousal/Alertness: Awake/alert, Lethargic Behavior During Therapy: Flat affect Overall Cognitive Status: History of cognitive impairments - at baseline                                 General Comments: More participatory with daughter present, did not talk much, but was appropriate 80% of the time, daughter reports this is her baseline        Exercises      General Comments General comments (skin integrity, edema, etc.): educated daughter on pressure relief techniques and rolling schedules along with utilization of prevalon boots; daughter reporting no further questions or any DME needs at this time      Pertinent Vitals/Pain Pain Assessment Pain Assessment: Faces Faces Pain Scale: No hurt Pain Intervention(s): Monitored during session    Home Living Family/patient expects to be discharged to:: Private residence Living Arrangements: Children Available Help at Discharge: Family;Available 24  hours/day Type of Home: House Home Access: Stairs to enter Entrance Stairs-Rails: None Entrance Stairs-Number of Steps: 2   Home Layout: One level Home Equipment: Conservation officer, nature (2 wheels);Shower seat - built in;Wheelchair - manual;BSC/3in1 Additional Comments: Pt is never alone, as she stays with one of her daughter's a couple days out of the week & her other daughter comes to the pt's home and stays the other days. (all info of PLOF and home was carried over from entry 07/28/22 as pt minimally verbal and no family present to confirm)    Prior Function            PT Goals (current goals can now be found in the care plan section) Acute Rehab PT Goals Patient Stated Goal: daughter plans to take pt home on hospice PT Goal Formulation: With patient/family Time For Goal Achievement: 09/24/22 Potential to Achieve Goals: Fair Progress towards PT goals: Progressing toward goals    Frequency    Min 3X/week      PT Plan Discharge plan needs to be updated;Equipment recommendations need to be updated    Co-evaluation PT/OT/SLP Co-Evaluation/Treatment: Yes Reason for Co-Treatment: Necessary to address cognition/behavior during functional activity;For patient/therapist safety;To address functional/ADL transfers PT goals addressed during session: Mobility/safety with mobility;Balance;Strengthening/ROM;Proper use of DME        AM-PAC PT "6 Clicks" Mobility   Outcome Measure  Help needed turning from your back to your side while in a flat bed without using bedrails?: A Lot Help needed moving from lying on your  back to sitting on the side of a flat bed without using bedrails?: Total Help needed moving to and from a bed to a chair (including a wheelchair)?: Total Help needed standing up from a chair using your arms (e.g., wheelchair or bedside chair)?: Total Help needed to walk in hospital room?: Total Help needed climbing 3-5 steps with a railing? : Total 6 Click Score: 7    End of  Session Equipment Utilized During Treatment: Gait belt Activity Tolerance: Patient tolerated treatment well Patient left: in bed;with call bell/phone within reach;with bed alarm set;with family/visitor present   PT Visit Diagnosis: Unsteadiness on feet (R26.81);Other abnormalities of gait and mobility (R26.89);Muscle weakness (generalized) (M62.81);Difficulty in walking, not elsewhere classified (R26.2)     Time: [id number] PT Time Calculation (min) (ACUTE ONLY): 23 min  Charges:  $Therapeutic Activity: 8-22 mins                     Moishe Spice, PT, DPT Acute Rehabilitation Services  Office: [phone]    Orvan Falconer 09/11/2022, 12:24 PM

## 2022-09-11 NOTE — Consult Note (Incomplete)
wound

## 2022-09-11 NOTE — Evaluation (Signed)
Occupational Therapy Evaluation Patient Details Name: [name] [name] MRN: [id number] DOB: 1934-02-17 Today's Date: 09/11/2022   History of Present Illness Pt is an 89 y.o. female who presented 09/10/22 with AMS and right gaze deviation. MRI negative for acute intracranial abnormality. Of note, pt recently finished 7 days course of Keflex for UTI. Admitted with acute encephalopathy. PMH: cryptogenic stroke in 2021, seizure on keppra, DM, GERD, HLD, HTN, hypothyroidism, osteoporosis, CKD, dementia, and ascites   Clinical Impression   Prior to this admission, patient at home with hospice with daughters as caretakers. Patient needed assist for all aspects of care, but could participate minimally in bathing, dressing, and feeding, and when she felt strong, could take a few steps with RW with need for support to come into standing from daughter. Currently, patient is presenting with decreased activity tolerance, minimal cognitive deficits (but cognitive assessments at baseline) and need for increased assist for ADLs and transfers. Patient min-mod of 2 to come EOB, min A to maintain sitting balance, and min to mod of 2 to come into standing. OT will follow acutely to maintain current level of function, but recommending return home with hospice once medically stable.      Recommendations for follow up therapy are one component of a multi-disciplinary discharge planning process, led by the attending physician.  Recommendations may be updated based on patient status, additional functional criteria and insurance authorization.   Follow Up Recommendations  No OT follow up (Return to home with hospice)     Assistance Recommended at Discharge Frequent or constant Supervision/Assistance  Patient can return home with the following A lot of help with walking and/or transfers;A lot of help with bathing/dressing/bathroom;Assistance with feeding;Direct supervision/assist for medications management;Direct  supervision/assist for financial management;Assist for transportation;Help with stairs or ramp for entrance;Assistance with cooking/housework    Functional Status Assessment  Patient has had a recent decline in their functional status and demonstrates the ability to make significant improvements in function in a reasonable and predictable amount of time.  Equipment Recommendations  None recommended by OT (Family has DME needed)    Recommendations for Other Services       Precautions / Restrictions Precautions Precautions: Fall Restrictions Weight Bearing Restrictions: No      Mobility Bed Mobility Overal bed mobility: Needs Assistance Bed Mobility: Supine to Sit, Sit to Supine     Supine to sit: Mod assist, +2 for physical assistance, +2 for safety/equipment, HOB elevated Sit to supine: HOB elevated, Mod assist, +2 for safety/equipment, +2 for physical assistance   General bed mobility comments: Improved initiation, but benefitting from +2 assist for safety, assist required at legs and trunk in and out of bed    Transfers Overall transfer level: Needs assistance Equipment used: Rolling walker (2 wheels) Transfers: Sit to/from Stand Sit to Stand: Min assist, +2 physical assistance, +2 safety/equipment, Mod assist           General transfer comment: Min to Mod A of 2 to come into standing, posterior lean noted throughout and flexed knees. Able to take a few steps to [facility] with mod A of 2 and PT/OT advancing RW      Balance Overall balance assessment: Needs assistance Sitting-balance support: Feet supported, Bilateral upper extremity supported Sitting balance-Leahy Scale: Poor Sitting balance - Comments: Posterior lean, needing min-modA for static sitting balance Postural control: Posterior lean Standing balance support: Bilateral upper extremity supported, During functional activity Standing balance-Leahy Scale: Poor Standing balance comment: Reliant on UE support and  mod-maxA for standing balance                           ADL either performed or assessed with clinical judgement   ADL Overall ADL's : Needs assistance/impaired Eating/Feeding: Moderate assistance;Sitting Eating/Feeding Details (indicate cue type and reason): due to known aspiration Grooming: Sitting;Minimal assistance   Upper Body Bathing: Maximal assistance;Sitting   Lower Body Bathing: Maximal assistance;Total assistance;Sit to/from stand;Sitting/lateral leans   Upper Body Dressing : Maximal assistance;Sitting   Lower Body Dressing: Maximal assistance;Total assistance;Sitting/lateral leans;Sit to/from stand   Toilet Transfer: Moderate assistance;+2 for physical assistance;+2 for safety/equipment;Rolling walker (2 wheels) Toilet Transfer Details (indicate cue type and reason): mod A to come into standing Toileting- Clothing Manipulation and Hygiene: Maximal assistance;Total assistance;Sitting/lateral lean;Sit to/from stand Toileting - Clothing Manipulation Details (indicate cue type and reason): from standing     Functional mobility during ADLs: Moderate assistance;+2 for physical assistance;+2 for safety/equipment;Cueing for safety;Cueing for sequencing General ADL Comments: Patient presenting with decreased activity tolerance, minimal cognitive deficits (but cognitive assessments at baseline) and need for increased assist for ADLs and transfers.     Vision Baseline Vision/History: 0 No visual deficits Ability to See in Adequate Light: 0 Adequate Patient Visual Report: No change from baseline Vision Assessment?: No apparent visual deficits     Perception     Praxis      Pertinent Vitals/Pain Pain Assessment Pain Assessment: Faces Faces Pain Scale: No hurt Pain Intervention(s): Monitored during session, Repositioned     Hand Dominance Right   Extremity/Trunk Assessment Upper Extremity Assessment Upper Extremity Assessment: Generalized weakness   Lower  Extremity Assessment Lower Extremity Assessment: Defer to PT evaluation   Cervical / Trunk Assessment Cervical / Trunk Assessment: Kyphotic   Communication Communication Communication: Expressive difficulties (minimally verbal with soft speech today)   Cognition Arousal/Alertness: Awake/alert, Lethargic Behavior During Therapy: Flat affect Overall Cognitive Status: History of cognitive impairments - at baseline                                 General Comments: More participatory with daughter present, did not talk much, but was appropriate 80% of the time, daughter reports this is her baseline     General Comments       Exercises     Shoulder Instructions      Home Living Family/patient expects to be discharged to:: Private residence Living Arrangements: Children Available Help at Discharge: Family;Available 24 hours/day Type of Home: House Home Access: Stairs to enter [employer] of Steps: 2 Entrance Stairs-Rails: None Home Layout: One level     Bathroom Shower/Tub: Occupational psychologist: Standard Bathroom Accessibility: Yes   Home Equipment: Conservation officer, nature (2 wheels);Shower seat - built in;Wheelchair - manual;BSC/3in1   Additional Comments: Pt is never alone, as she stays with one of her daughter's a couple days out of the week & her other daughter comes to the pt's home and stays the other days. (all info of PLOF and home was carried over from entry 07/28/22 as pt minimally verbal and no family present to confirm)      Prior Functioning/Environment Prior Level of Function : Needs assist (all info of PLOF and home was carried over from entry 07/28/22 as pt minimally verbal and no family present to confirm)             Mobility Comments: Family assists  with mobility for safety; pt uses a RW for household ambulation (shorter distances recently) ADLs Comments: Pt requires assist for bathing, dressing,  and toileting hygiene from  daughters; her daughters also manage the cooking and cleaning. Pt does not drive.        OT Problem List: Decreased strength;Decreased range of motion;Decreased activity tolerance;Impaired balance (sitting and/or standing);Decreased coordination;Decreased cognition;Decreased safety awareness;Decreased knowledge of use of DME or AE      OT Treatment/Interventions: Self-care/ADL training;Therapeutic exercise;Neuromuscular education;Energy conservation;DME and/or AE instruction;Manual therapy;Therapeutic activities;Cognitive remediation/compensation;Patient/family education;Balance training    OT Goals(Current goals can be found in the care plan section) Acute Rehab OT Goals Patient Stated Goal: to go home OT Goal Formulation: With patient/family Time For Goal Achievement: 09/25/22 Potential to Achieve Goals: Good ADL Goals Pt Will Perform Lower Body Bathing: with max assist;sit to/from stand;sitting/lateral leans Pt Will Perform Lower Body Dressing: with max assist;sit to/from stand;sitting/lateral leans Pt Will Transfer to Toilet: with mod assist;stand pivot transfer;bedside commode Pt Will Perform Toileting - Clothing Manipulation and hygiene: with mod assist;sitting/lateral leans;sit to/from stand Additional ADL Goal #1: Patient will be able to sit EOB for 3-5 minutes without need for min A to maintain upright seated position.  OT Frequency: Min 2X/week    Co-evaluation              AM-PAC OT "6 Clicks" Daily Activity     Outcome Measure Help from another person eating meals?: A Lot Help from another person taking care of personal grooming?: A Little Help from another person toileting, which includes using toliet, bedpan, or urinal?: A Lot Help from another person bathing (including washing, rinsing, drying)?: A Lot Help from another person to put on and taking off regular upper body clothing?: A Lot Help from another person to put on and taking off regular lower body clothing?:  A Lot 6 Click Score: 13   End of Session Equipment Utilized During Treatment: Gait belt;Rolling walker (2 wheels) Nurse Communication: Mobility status  Activity Tolerance: Patient tolerated treatment well Patient left: in bed;with call bell/phone within reach;with family/visitor present (chair position)  OT Visit Diagnosis: Unsteadiness on feet (R26.81);Other abnormalities of gait and mobility (R26.89);Muscle weakness (generalized) (M62.81);Other symptoms and signs involving cognitive function                Time: [id number] OT Time Calculation (min): 21 min Charges:  OT General Charges $OT Visit: 1 Visit OT Evaluation $OT Eval Moderate Complexity: 1 Mod  Corinne Ports E. Jeovany Huitron, OTR/L Acute Rehabilitation Services [phone]   Ascencion Dike 09/11/2022, 12:11 PM

## 2022-09-11 NOTE — Progress Notes (Signed)
Discharge instructions discussed with patient daughter [name]. All questions answered. Iv removed and telemetry discontinued. Patient belongings packed and sent with patient. Patient wheeled off unit for transport home.  Gwendolyn Grant, RN

## 2022-09-11 NOTE — Progress Notes (Addendum)
Inpatient Diabetes Program Recommendations  AACE/ADA: New Consensus Statement on Inpatient Glycemic Control (2015)  Target Ranges:  Prepandial:   less than 140 mg/dL      Peak postprandial:   less than 180 mg/dL (1-2 hours)      Critically ill patients:  140 - 180 mg/dL   Lab Results  Component Value Date   GLUCAP 276 (H) 09/11/2022   HGBA1C 8.9 (H) 05/10/2022    Review of Glycemic Control  Latest Reference Range & Units 09/10/22 11:17 09/10/22 17:22 09/10/22 21:12 09/11/22 06:02 09/11/22 11:22  Glucose-Capillary 70 - 99 mg/dL 85 176 (H) 322 (H) 354 (H) 276 (H)   Diabetes history:  DM 2 Outpatient Diabetes medications:  Levemir 7 units q AM and 3 units q HS Novolog 2-10 units tid with meals Current orders for Inpatient glycemic control:  Novolog 0-9 units tid with meals  Inpatient Diabetes Program Recommendations:    Consider adding Levemir 5 units q HS.   Thanks,  Adah Perl, RN, BC-ADM Inpatient Diabetes Coordinator Pager [phone]  (8a-5p)

## 2022-09-11 NOTE — Consult Note (Signed)
WOC Nurse Consult Note: Reason for Consult:Bilateral great toe wounds and lesion to left hallux valgus (bunion) Wound type:full thickness Pressure Injury POA: N/A Measurement:To be obtained with next dressing application by Bedside RN and entered onto Nursing Flow Sheet Wound bed:Per Nursing Flow Sheet: pink, dry, clean Drainage (amount, consistency, odor) none Periwound:intact Dressing procedure/placement/frequency:I have provided guidance for Nursing fo the daily care of these lesions using a soap and water cleanse, rinse and dry followed by a thin application of leptospermum Manuka honey (MediHoney) for antimicrobial moist wound healing. This is to be topped with a dry gauze dressing and changed daily. Bilateral heel pressure redistribution heel boots are provided. A sacral foam is to be placed prophylactically for PI prevention. Turning and repositioning I sin place and will continue. Guidance has been provided to minimize time in the supine position.  [place] nursing team will not follow, but will remain available to this patient, the nursing and medical teams.  Please re-consult if needed.  Thank you for inviting [place] to participate in this patient's Plan of Care.  Maudie Flakes, MSN, RN, CNS, [place], Serita Grammes, [employer], [employer] phone:  [phone]

## 2022-09-11 NOTE — Discharge Summary (Signed)
Physician Discharge Summary   Patient: [name] [name] MRN: [id number] DOB: December 21, 1933  Admit date:     09/10/2022  Discharge date: 09/11/22  Discharge Physician: Marylu Lund   PCP: Charlane Ferretti, MD   Recommendations at discharge:    Follow up with PCPi n 1-2 weeks Recommend repeat TSH in 4-6 weeks  Discharge Diagnoses: Principal Problem:   AMS (altered mental status) Active Problems:   Acute encephalopathy   History of seizure   Dementia without behavioral disturbance (HCC)  Resolved Problems:   * No resolved hospital problems. *  Hospital Course: 89 y.o. female with medical history significant of IDDM, HTN, hypothyroidism, seizure disorder on Keppra, dementia, dermatomyositis on chronic prednisone and methotrexate, dysphagia, orthostatic hypotension, brought in by family member for evaluation of altered mentations. Pt was found to have neg acute findings on brain MRI, CTA head/neck without large vessel occlusion, severe L M2 MCA stenosis and mod R P2 PCA stenosis. Pt later found to have very low TSH and elevated free T4.[facility] Neurology was also consulted  Assessment and Plan: Acute encephalopathy -Neurology consulted -EEG was neg for active seizure -Per Neurology, recommendation to increase Keppra to '750mg'$  bid -Pt was found to be hyperthyroid, see below. Thyroid dosage was changed -by day of discharge, family reports pt's mentation is back to her baseline   Vaginal candidiasis -Likely related to recent antibiotic usage, 1 dose of p.o. Diflucan ordered   IDDM -continue home regimen on d/c   Hypothyroidism, hyperthyroid -History of hypothyroid noted -TSH noted to be low at 0.069 with a recent free T4 elevated at 2.06 -Thyroid hormone dosage decreased from 224mg to 1065m at time of d/c -Recommend repeat TSH in 4-6 weeks   History of dermatomyositis -Reasonably controlled as per daughter, intermittent bilateral shoulder and bilateral knee pain -Continue steroid and  weekly methotrexate   HTN with history of orthostatic hypotension -Continue home BP meds and home dose of midodrine       Consultants: Neurology Procedures performed:   Disposition: Home Diet recommendation:  Dysphagia type 3 thin Liquid DISCHARGE MEDICATION: Allergies as of 09/11/2022       Reactions   Nsaids Other (See Comments)   No NSAIDS due to kidney function- ESPECIALLY ibuprofen or Aleve   Biaxin [clarithromycin] Hives   Bactrim [sulfamethoxazole-trimethoprim] Hives, Rash   Lisinopril Cough   Sulfa Antibiotics Hives, Rash, Other (See Comments)   NO sulfa-based meds!!        Medication List     STOP taking these medications    cephALEXin 500 MG capsule Commonly known as: KEFLEX       TAKE these medications    acetaminophen 650 MG CR tablet Commonly known as: TYLENOL Take 650 mg by mouth every 8 (eight) hours as needed for pain.   Bayer Low Dose 81 MG tablet Generic drug: aspirin EC Take 81 mg by mouth daily. Swallow whole.   cetirizine 10 MG tablet Commonly known as: ZYRTEC Take 10 mg by mouth daily as needed (itching).   folic acid 1 MG tablet Commonly known as: FOLVITE Take 1 mg by mouth at bedtime.   Levemir FlexTouch 100 UNIT/ML FlexPen Generic drug: insulin detemir Inject 3-7 Units into the skin See admin instructions. Inject 7 units into the skin in the morning and 3 units at bedtime if BGL is 200 or below and 4 units if BGL is greater than 200   levETIRAcetam 750 MG tablet Commonly known as: KEPPRA Take 1 tablet (750 mg total) by  mouth 2 (two) times daily. What changed:  medication strength how much to take   levothyroxine 100 MCG tablet Commonly known as: SYNTHROID Take 1 tablet (100 mcg total) by mouth daily before breakfast. Start taking on: September 12, 2022 What changed:  medication strength how much to take   liver oil-zinc oxide 40 % ointment Commonly known as: DESITIN Apply topically 3 (three) times daily. What  changed:  how much to take when to take this reasons to take this   loperamide 2 MG tablet Commonly known as: IMODIUM A-D Take 2 mg by mouth 4 (four) times daily as needed for diarrhea or loose stools.   methotrexate 2.5 MG tablet Commonly known as: RHEUMATREX Take 1 tablet (2.5 mg total) by mouth once a week. Caution:Chemotherapy. Protect from light. What changed:  how much to take when to take this   metoprolol tartrate 25 MG tablet Commonly known as: LOPRESSOR Take 1 tablet (25 mg total) by mouth 2 (two) times daily.   midodrine 5 MG tablet Commonly known as: PROAMATINE Take 1 tablet (5 mg total) by mouth 3 (three) times daily with meals. What changed: when to take this   NovoLOG FlexPen 100 UNIT/ML FlexPen Generic drug: insulin aspart Inject 2-10 Units into the skin See admin instructions. Inject 2-10 units into the skin three times a day with meals, per sliding scale:  Breakfast: BGL 80-199 = 8 units; 200-299 = 9 units; 300 or greater = 10 units Lunch: BGL 80-199 = 5 units; 200-299 = 6 units; 300 or greater = 7 units Supper/evening meal: BGL 80-199 = 2 units; 200-299 = 3 units; 300 or greater = 4 units   PEPCID AC PO Take 20 mg by mouth at bedtime.   polyethylene glycol 17 g packet Commonly known as: MIRALAX / GLYCOLAX Take 17 g by mouth daily. What changed:  when to take this reasons to take this   predniSONE 5 MG tablet Commonly known as: DELTASONE Take 5 mg by mouth daily with breakfast.   triamcinolone ointment 0.1 % Commonly known as: KENALOG Apply 1 Application topically 2 (two) times daily as needed (itching, rash).   VITAMIN D-3 PO Take 1,000 Units by mouth daily.        Follow-up Information     Charlane Ferretti, MD Follow up in 2 week(s).   Specialty: Internal Medicine Why: Hospital follow up Contact information: [address] [place] [address] [phone]                Discharge Exam: Danley Danker Weights   2022-09-13  1100  Weight: 46.6 kg  General exam: Awake, laying in bed, in nad Respiratory system: Normal respiratory effort, no wheezing Cardiovascular system: regular rate, s1, s2 Gastrointestinal system: Soft, nondistended, positive BS Central nervous system: CN2-12 grossly intact, strength intact Extremities: Perfused, no clubbing Skin: Normal skin turgor, no notable skin lesions seen Psychiatry: Mood normal // no visual hallucinations   Condition at discharge: fair  The results of significant diagnostics from this hospitalization (including imaging, microbiology, ancillary and laboratory) are listed below for reference.   Imaging Studies: EEG adult  Result Date: 13-Sep-2022 Lora Havens, MD     09-13-22  2:43 PM Patient Name: SAMANTHAMARIE GALUSKA MRN: [id number] Epilepsy Attending: Lora Havens Referring Physician/Provider: Janine Ores, NP Date: 09/13/22 Duration: 22.06 mins Patient history: 89 y.o. female with a past medical history of cryptogenic stroke in 2021, seizure on keppra, DM, GERD, HLD, HTN, hypothyroidism, osteoporosis, CKD,  and ascites presenting unresponsive and with right gaze deviation. EEG to evaluate for seizure. Level of alertness: Awake AEDs during EEG study: LEV Technical aspects: This EEG study was done with scalp electrodes positioned according to the 10-20 International system of electrode placement. Electrical activity was reviewed with band pass filter of 1-'70Hz'$ , sensitivity of 7 uV/mm, display speed of 16m/sec with a '60Hz'$  notched filter applied as appropriate. EEG data were recorded continuously and digitally stored.  Video monitoring was available and reviewed as appropriate. Description: The posterior dominant rhythm consists of 8 Hz activity of moderate voltage (25-35 uV) seen predominantly in posterior head regions, symmetric and reactive to eye opening and eye closing. EEG showed intermittent generalized polymorphic 3 to 6 Hz theta-delta slowing, at times with  triphasic morphology..  Hyperventilation and photic stimulation were not performed.   ABNORMALITY - Intermittent slow, generalized IMPRESSION: This study is suggestive of mild diffuse encephalopathy, nonspecific etiology.  No seizures or definite epileptiform discharges were seen throughout the recording. Please note that lack of epileptiform activity during interictal EEG does not exclude the diagnosis of epilepsy. PLora Havens  DG Chest Port 1 View  Result Date: 09/10/2022 CLINICAL DATA:  weakness EXAM: PORTABLE CHEST - 1 VIEW COMPARISON:  09/03/2022 FINDINGS: Cardiac silhouette is unremarkable. No pneumothorax or pleural effusion. The lungs are clear. Aorta is calcified. The visualized skeletal structures are unremarkable. There is an implanted cardiac monitor. IMPRESSION: No acute cardiopulmonary process. Electronically Signed   By: JSammie BenchM.D.   On: 09/10/2022 13:33   MR BRAIN WO CONTRAST  Result Date: 09/10/2022 CLINICAL DATA:  Code stroke EXAM: MRI HEAD WITHOUT CONTRAST TECHNIQUE: Multiplanar, multiecho pulse sequences of the brain and surrounding structures were obtained without intravenous contrast. COMPARISON:  None Available. FINDINGS: Brain: No acute infarction, hemorrhage, hydrocephalus, extra-axial collection or mass lesion. Sequela of moderate chronic microvascular ischemic change. Generalized volume loss. Vascular: Normal flow voids. Skull and upper cervical spine: Normal marrow signal. Sinuses/Orbits: Negative.  Bilateral lens replacement. Other: None. IMPRESSION: No acute intracranial abnormality Electronically Signed   By: HMarin RobertsM.D.   On: 09/10/2022 12:44   CT ANGIO HEAD NECK W WO CM W PERF (CODE STROKE)  Result Date: 09/10/2022 CLINICAL DATA:  Neuro deficit, acute, stroke suspected EXAM: CT ANGIOGRAPHY HEAD AND NECK CT PERFUSION BRAIN TECHNIQUE: Multidetector CT imaging of the head and neck was performed using the standard protocol during bolus administration  of intravenous contrast. Multiplanar CT image reconstructions and MIPs were obtained to evaluate the vascular anatomy. Carotid stenosis measurements (when applicable) are obtained utilizing NASCET criteria, using the distal internal carotid diameter as the denominator. Multiphase CT imaging of the brain was performed following IV bolus contrast injection. Subsequent parametric perfusion maps were calculated using RAPID software. RADIATION DOSE REDUCTION: This exam was performed according to the departmental dose-optimization program which includes automated exposure control, adjustment of the mA and/or kV according to patient size and/or use of iterative reconstruction technique. COMPARISON:  None Available. FINDINGS: CTA NECK FINDINGS Aortic arch: Visualized great vessel origins are patent without significant stenosis. Atherosclerosis. Right carotid system: No evidence of dissection, stenosis (50% or greater) or occlusion. Left carotid system: No evidence of dissection, stenosis (50% or greater) or occlusion. Moderate atherosclerosis at the carotid bifurcation. Vertebral arteries: Right-dominant. No evidence of dissection, stenosis (50% or greater) or occlusion. Skeleton: Multilevel degenerative change. Other neck: No acute findings on limited assessment. Upper chest: Approximately 6 mm pulmonary nodule in the partially visualized left upper lobe. Review  of the MIP images confirms the above findings CTA HEAD FINDINGS Anterior circulation: Calcific atherosclerosis of the intracranial ICAs without significant stenosis. Bilateral MCAs and ACAs are patent. Severe stenosis of a left M2 MCA branch. Posterior circulation: Bilateral intradural vertebral arteries colo basilar artery and bilateral posterior cerebral arteries are patent. Moderate right P2 PCA stenosis. Venous sinuses: Not well assessed due to arterial contrast timing. Review of the MIP images confirms the above findings CT Brain Perfusion Findings: ASPECTS:  10. CBF (<30%) Volume: 62m Perfusion (Tmax>6.0s) volume: 038mMismatch Volume: 0 mL Infarction Location:None identified. IMPRESSION: 1. No emergent large vessel occlusion. 2. Severe left M2 MCA branch stenosis. 3. Moderate right P2 PCA stenosis. 4. Approximately 6 mm pulmonary nodule in the partially visualized left upper lobe. Non-contrast chest CT at 6-12 months is recommended. If the nodule is stable at time of repeat CT, then future CT at 18-24 months (from today's scan) is considered optional for low-risk patients, but is recommended for high-risk patients. This recommendation follows the consensus statement: Guidelines for Management of Incidental Pulmonary Nodules Detected on CT Images: From the Fleischner Society 2017; Radiology 2017; 284:228-243. Preliminary findings discussed with Dr. KiLeonel Ramsayia telephone at 11:35 a.m. Electronically Signed   By: FrMargaretha Sheffield.D.   On: 09/10/2022 11:47   CT HEAD CODE STROKE WO CONTRAST  Result Date: 09/10/2022 CLINICAL DATA:  Code stroke.  Neuro deficit, acute, stroke suspected EXAM: CT HEAD WITHOUT CONTRAST TECHNIQUE: Contiguous axial images were obtained from the base of the skull through the vertex without intravenous contrast. RADIATION DOSE REDUCTION: This exam was performed according to the departmental dose-optimization program which includes automated exposure control, adjustment of the mA and/or kV according to patient size and/or use of iterative reconstruction technique. COMPARISON:  CT head 08/01/2022. FINDINGS: Brain: No evidence of acute large vascular territory infarction, hemorrhage, hydrocephalus, extra-axial collection or mass lesion/mass effect. Similar patchy white matter hypodensities, nonspecific but compatible with chronic microvascular ischemic disease. Similar cerebral atrophy. Vascular: No hyperdense vessel identified. Skull: No acute fracture. Sinuses/Orbits: Clear sinuses.  No acute orbital findings. Other: No sizable mastoid  effusions. ASPECTS ([facility] Institut[facility]roke Program Early CT Score) total score (0-10 with 10 being normal): 10. IMPRESSION: No evidence of acute intracranial abnormality. ASPECTS is 10. Code stroke imaging results were communicated on 09/10/2022 at 11:30 am to provider Dr. KiLeonel Ramsayia secure text paging. Electronically Signed   By: FrMargaretha Sheffield.D.   On: 09/10/2022 11:30   DG Chest Portable 1 View  Result Date: 09/03/2022 CLINICAL DATA:  Shortness of breath. EXAM: PORTABLE CHEST 1 VIEW COMPARISON:  August 01, 2022. FINDINGS: The heart size and mediastinal contours are within normal limits. Both lungs are clear. The visualized skeletal structures are unremarkable. IMPRESSION: No active disease. Electronically Signed   By: JaMarijo Conception.D.   On: 09/03/2022 12:27    Microbiology: Results for orders placed or performed during the hospital encounter of 09/10/22  Resp panel by RT-PCR (RSV, Flu A&B, Covid) Anterior Nasal Swab     Status: None   Collection Time: 09/10/22 12:52 PM   Specimen: Anterior Nasal Swab  Result Value Ref Range Status   SARS Coronavirus 2 by RT PCR NEGATIVE NEGATIVE Final   Influenza A by PCR NEGATIVE NEGATIVE Final   Influenza B by PCR NEGATIVE NEGATIVE Final    Comment: (NOTE) The Xpert Xpress SARS-CoV-2/FLU/RSV plus assay is intended as an aid in the diagnosis of influenza from Nasopharyngeal swab specimens and should not be used as  a sole basis for treatment. Nasal washings and aspirates are unacceptable for Xpert Xpress SARS-CoV-2/FLU/RSV testing.  Fact Sheet for Patients: EntrepreneurPulse.com.au  Fact Sheet for Healthcare Providers: IncredibleEmployment.be  This test is not yet approved or cleared by the [place] FDA and has been authorized for detection and/or diagnosis of SARS-CoV-2 by FDA under an Emergency Use Authorization (EUA). This EUA will remain in effect (meaning this test can be used) for the duration of  the COVID-19 declaration under Section 564(b)(1) of the Act, 21 U.S.C. section 360bbb-3(b)(1), unless the authorization is terminated or revoked.     Resp Syncytial Virus by PCR NEGATIVE NEGATIVE Final    Comment: (NOTE) Fact Sheet for Patients: EntrepreneurPulse.com.au  Fact Sheet for Healthcare Providers: IncredibleEmployment.be  This test is not yet approved or cleared by the [place] FDA and has been authorized for detection and/or diagnosis of SARS-CoV-2 by FDA under an Emergency Use Authorization (EUA). This EUA will remain in effect (meaning this test can be used) for the duration of the COVID-19 declaration under Section 564(b)(1) of the Act, 21 U.S.C. section 360bbb-3(b)(1), unless the authorization is terminated or revoked.  Performed at [facility], [place] [address], [place], [place] [address]     Labs: CBC: Recent Labs  Lab 09/10/22 1120 09/10/22 1123  WBC 9.2  --   NEUTROABS 7.3  --   HGB 9.5* 10.2*  HCT 27.6* 30.0*  MCV 96.2  --   PLT 260  --    Basic Metabolic Panel: Recent Labs  Lab 09/10/22 1120 09/10/22 1123  NA 134* 133*  K 3.5 3.6  CL 97* 97*  CO2 25  --   GLUCOSE 93 90  BUN 17 19  CREATININE 0.86 0.80  CALCIUM 9.4  --    Liver Function Tests: Recent Labs  Lab 09/10/22 1120  AST 38  ALT 24  ALKPHOS 76  BILITOT 0.5  PROT 5.7*  ALBUMIN 2.7*   CBG: Recent Labs  Lab 09/10/22 1117 09/10/22 1722 09/10/22 2112 09/11/22 0602 09/11/22 1122  GLUCAP 85 176* 322* 354* 276*    Discharge time spent: less than 30 minutes.  Signed: Marylu Lund, MD [facility] 09/11/2022

## 2022-09-11 NOTE — Hospital Course (Signed)
89 y.o. female with medical history significant of IDDM, HTN, hypothyroidism, seizure disorder on Keppra, dementia, dermatomyositis on chronic prednisone and methotrexate, dysphagia, orthostatic hypotension, brought in by family member for evaluation of altered mentations. Pt was found to have neg acute findings on brain MRI, CTA head/neck without large vessel occlusion, severe L M2 MCA stenosis and mod R P2 PCA stenosis. Pt later found to have very low TSH and elevated free T4.Marland Kitchen Neurology was also consulted

## 2022-09-11 NOTE — Progress Notes (Addendum)
Patient ready for discharge to home; discharge instructions given and reviewed with patient and daughter by [place]. Patient dressed and transferred into the wheelchair; daughter to take patient home. Rx's delivered from [place].

## 2022-09-16 DIAGNOSIS — E1151 Type 2 diabetes mellitus with diabetic peripheral angiopathy without gangrene: Secondary | ICD-10-CM | POA: Diagnosis not present

## 2022-09-16 DIAGNOSIS — Z79891 Long term (current) use of opiate analgesic: Secondary | ICD-10-CM | POA: Diagnosis not present

## 2022-09-16 DIAGNOSIS — J690 Pneumonitis due to inhalation of food and vomit: Secondary | ICD-10-CM | POA: Diagnosis not present

## 2022-09-16 DIAGNOSIS — N182 Chronic kidney disease, stage 2 (mild): Secondary | ICD-10-CM | POA: Diagnosis not present

## 2022-09-16 DIAGNOSIS — E440 Moderate protein-calorie malnutrition: Secondary | ICD-10-CM | POA: Diagnosis not present

## 2022-09-16 DIAGNOSIS — E1159 Type 2 diabetes mellitus with other circulatory complications: Secondary | ICD-10-CM | POA: Diagnosis not present

## 2022-09-16 DIAGNOSIS — Z794 Long term (current) use of insulin: Secondary | ICD-10-CM | POA: Diagnosis not present

## 2022-09-16 DIAGNOSIS — G9341 Metabolic encephalopathy: Secondary | ICD-10-CM | POA: Diagnosis not present

## 2022-09-16 DIAGNOSIS — E1122 Type 2 diabetes mellitus with diabetic chronic kidney disease: Secondary | ICD-10-CM | POA: Diagnosis not present

## 2022-09-16 DIAGNOSIS — E11649 Type 2 diabetes mellitus with hypoglycemia without coma: Secondary | ICD-10-CM | POA: Diagnosis not present

## 2022-09-16 DIAGNOSIS — G40909 Epilepsy, unspecified, not intractable, without status epilepticus: Secondary | ICD-10-CM | POA: Diagnosis not present

## 2022-09-16 DIAGNOSIS — E039 Hypothyroidism, unspecified: Secondary | ICD-10-CM | POA: Diagnosis not present

## 2022-09-16 DIAGNOSIS — R69 Illness, unspecified: Secondary | ICD-10-CM | POA: Diagnosis not present

## 2022-09-16 DIAGNOSIS — I152 Hypertension secondary to endocrine disorders: Secondary | ICD-10-CM | POA: Diagnosis not present

## 2022-09-16 DIAGNOSIS — D631 Anemia in chronic kidney disease: Secondary | ICD-10-CM | POA: Diagnosis not present

## 2022-09-16 DIAGNOSIS — L89152 Pressure ulcer of sacral region, stage 2: Secondary | ICD-10-CM | POA: Diagnosis not present

## 2022-09-16 DIAGNOSIS — N179 Acute kidney failure, unspecified: Secondary | ICD-10-CM | POA: Diagnosis not present

## 2022-09-16 DIAGNOSIS — Z7982 Long term (current) use of aspirin: Secondary | ICD-10-CM | POA: Diagnosis not present

## 2022-09-22 DIAGNOSIS — E039 Hypothyroidism, unspecified: Secondary | ICD-10-CM | POA: Diagnosis not present

## 2022-09-22 DIAGNOSIS — N179 Acute kidney failure, unspecified: Secondary | ICD-10-CM | POA: Diagnosis not present

## 2022-09-22 DIAGNOSIS — Z794 Long term (current) use of insulin: Secondary | ICD-10-CM | POA: Diagnosis not present

## 2022-09-22 DIAGNOSIS — G9341 Metabolic encephalopathy: Secondary | ICD-10-CM | POA: Diagnosis not present

## 2022-09-22 DIAGNOSIS — Z7982 Long term (current) use of aspirin: Secondary | ICD-10-CM | POA: Diagnosis not present

## 2022-09-22 DIAGNOSIS — Z79891 Long term (current) use of opiate analgesic: Secondary | ICD-10-CM | POA: Diagnosis not present

## 2022-09-22 DIAGNOSIS — L89152 Pressure ulcer of sacral region, stage 2: Secondary | ICD-10-CM | POA: Diagnosis not present

## 2022-09-22 DIAGNOSIS — I152 Hypertension secondary to endocrine disorders: Secondary | ICD-10-CM | POA: Diagnosis not present

## 2022-09-22 DIAGNOSIS — D631 Anemia in chronic kidney disease: Secondary | ICD-10-CM | POA: Diagnosis not present

## 2022-09-22 DIAGNOSIS — E11649 Type 2 diabetes mellitus with hypoglycemia without coma: Secondary | ICD-10-CM | POA: Diagnosis not present

## 2022-09-22 DIAGNOSIS — E1151 Type 2 diabetes mellitus with diabetic peripheral angiopathy without gangrene: Secondary | ICD-10-CM | POA: Diagnosis not present

## 2022-09-22 DIAGNOSIS — G40909 Epilepsy, unspecified, not intractable, without status epilepticus: Secondary | ICD-10-CM | POA: Diagnosis not present

## 2022-09-22 DIAGNOSIS — N182 Chronic kidney disease, stage 2 (mild): Secondary | ICD-10-CM | POA: Diagnosis not present

## 2022-09-22 DIAGNOSIS — E1159 Type 2 diabetes mellitus with other circulatory complications: Secondary | ICD-10-CM | POA: Diagnosis not present

## 2022-09-22 DIAGNOSIS — R69 Illness, unspecified: Secondary | ICD-10-CM | POA: Diagnosis not present

## 2022-09-22 DIAGNOSIS — E1122 Type 2 diabetes mellitus with diabetic chronic kidney disease: Secondary | ICD-10-CM | POA: Diagnosis not present

## 2022-09-22 DIAGNOSIS — E440 Moderate protein-calorie malnutrition: Secondary | ICD-10-CM | POA: Diagnosis not present

## 2022-09-22 DIAGNOSIS — J690 Pneumonitis due to inhalation of food and vomit: Secondary | ICD-10-CM | POA: Diagnosis not present

## 2022-09-24 DIAGNOSIS — E440 Moderate protein-calorie malnutrition: Secondary | ICD-10-CM | POA: Diagnosis not present

## 2022-09-24 DIAGNOSIS — Z794 Long term (current) use of insulin: Secondary | ICD-10-CM | POA: Diagnosis not present

## 2022-09-24 DIAGNOSIS — I152 Hypertension secondary to endocrine disorders: Secondary | ICD-10-CM | POA: Diagnosis not present

## 2022-09-24 DIAGNOSIS — G40909 Epilepsy, unspecified, not intractable, without status epilepticus: Secondary | ICD-10-CM | POA: Diagnosis not present

## 2022-09-24 DIAGNOSIS — R69 Illness, unspecified: Secondary | ICD-10-CM | POA: Diagnosis not present

## 2022-09-24 DIAGNOSIS — Z79891 Long term (current) use of opiate analgesic: Secondary | ICD-10-CM | POA: Diagnosis not present

## 2022-09-24 DIAGNOSIS — J690 Pneumonitis due to inhalation of food and vomit: Secondary | ICD-10-CM | POA: Diagnosis not present

## 2022-09-24 DIAGNOSIS — G9341 Metabolic encephalopathy: Secondary | ICD-10-CM | POA: Diagnosis not present

## 2022-09-24 DIAGNOSIS — E11649 Type 2 diabetes mellitus with hypoglycemia without coma: Secondary | ICD-10-CM | POA: Diagnosis not present

## 2022-09-24 DIAGNOSIS — E1151 Type 2 diabetes mellitus with diabetic peripheral angiopathy without gangrene: Secondary | ICD-10-CM | POA: Diagnosis not present

## 2022-09-24 DIAGNOSIS — N390 Urinary tract infection, site not specified: Secondary | ICD-10-CM | POA: Diagnosis not present

## 2022-09-24 DIAGNOSIS — N179 Acute kidney failure, unspecified: Secondary | ICD-10-CM | POA: Diagnosis not present

## 2022-09-24 DIAGNOSIS — E1122 Type 2 diabetes mellitus with diabetic chronic kidney disease: Secondary | ICD-10-CM | POA: Diagnosis not present

## 2022-09-24 DIAGNOSIS — D631 Anemia in chronic kidney disease: Secondary | ICD-10-CM | POA: Diagnosis not present

## 2022-09-24 DIAGNOSIS — L89152 Pressure ulcer of sacral region, stage 2: Secondary | ICD-10-CM | POA: Diagnosis not present

## 2022-09-24 DIAGNOSIS — N182 Chronic kidney disease, stage 2 (mild): Secondary | ICD-10-CM | POA: Diagnosis not present

## 2022-09-24 DIAGNOSIS — E039 Hypothyroidism, unspecified: Secondary | ICD-10-CM | POA: Diagnosis not present

## 2022-09-24 DIAGNOSIS — E1159 Type 2 diabetes mellitus with other circulatory complications: Secondary | ICD-10-CM | POA: Diagnosis not present

## 2022-09-24 DIAGNOSIS — Z7982 Long term (current) use of aspirin: Secondary | ICD-10-CM | POA: Diagnosis not present

## 2022-09-24 DIAGNOSIS — F0390 Unspecified dementia without behavioral disturbance: Secondary | ICD-10-CM | POA: Diagnosis not present

## 2022-09-25 ENCOUNTER — Ambulatory Visit: Payer: Medicare HMO

## 2022-09-25 DIAGNOSIS — I6389 Other cerebral infarction: Secondary | ICD-10-CM | POA: Diagnosis not present

## 2022-09-25 LAB — CUP PACEART REMOTE DEVICE CHECK
Date Time Interrogation Session: 20240307230512
Implantable Pulse Generator Implant Date: 20210203

## 2022-09-28 DIAGNOSIS — L89152 Pressure ulcer of sacral region, stage 2: Secondary | ICD-10-CM | POA: Diagnosis not present

## 2022-09-28 DIAGNOSIS — N179 Acute kidney failure, unspecified: Secondary | ICD-10-CM | POA: Diagnosis not present

## 2022-09-28 DIAGNOSIS — I152 Hypertension secondary to endocrine disorders: Secondary | ICD-10-CM | POA: Diagnosis not present

## 2022-09-28 DIAGNOSIS — Z794 Long term (current) use of insulin: Secondary | ICD-10-CM | POA: Diagnosis not present

## 2022-09-28 DIAGNOSIS — R69 Illness, unspecified: Secondary | ICD-10-CM | POA: Diagnosis not present

## 2022-09-28 DIAGNOSIS — E1159 Type 2 diabetes mellitus with other circulatory complications: Secondary | ICD-10-CM | POA: Diagnosis not present

## 2022-09-28 DIAGNOSIS — E1151 Type 2 diabetes mellitus with diabetic peripheral angiopathy without gangrene: Secondary | ICD-10-CM | POA: Diagnosis not present

## 2022-09-28 DIAGNOSIS — G40909 Epilepsy, unspecified, not intractable, without status epilepticus: Secondary | ICD-10-CM | POA: Diagnosis not present

## 2022-09-28 DIAGNOSIS — Z7982 Long term (current) use of aspirin: Secondary | ICD-10-CM | POA: Diagnosis not present

## 2022-09-28 DIAGNOSIS — F0390 Unspecified dementia without behavioral disturbance: Secondary | ICD-10-CM | POA: Diagnosis not present

## 2022-09-28 DIAGNOSIS — N182 Chronic kidney disease, stage 2 (mild): Secondary | ICD-10-CM | POA: Diagnosis not present

## 2022-09-28 DIAGNOSIS — E11649 Type 2 diabetes mellitus with hypoglycemia without coma: Secondary | ICD-10-CM | POA: Diagnosis not present

## 2022-09-28 DIAGNOSIS — E1122 Type 2 diabetes mellitus with diabetic chronic kidney disease: Secondary | ICD-10-CM | POA: Diagnosis not present

## 2022-09-28 DIAGNOSIS — G9341 Metabolic encephalopathy: Secondary | ICD-10-CM | POA: Diagnosis not present

## 2022-09-28 DIAGNOSIS — J690 Pneumonitis due to inhalation of food and vomit: Secondary | ICD-10-CM | POA: Diagnosis not present

## 2022-09-28 DIAGNOSIS — E039 Hypothyroidism, unspecified: Secondary | ICD-10-CM | POA: Diagnosis not present

## 2022-09-28 DIAGNOSIS — Z79891 Long term (current) use of opiate analgesic: Secondary | ICD-10-CM | POA: Diagnosis not present

## 2022-09-28 DIAGNOSIS — D631 Anemia in chronic kidney disease: Secondary | ICD-10-CM | POA: Diagnosis not present

## 2022-09-28 DIAGNOSIS — E440 Moderate protein-calorie malnutrition: Secondary | ICD-10-CM | POA: Diagnosis not present

## 2022-09-29 DIAGNOSIS — E1142 Type 2 diabetes mellitus with diabetic polyneuropathy: Secondary | ICD-10-CM | POA: Diagnosis not present

## 2022-09-29 DIAGNOSIS — E1122 Type 2 diabetes mellitus with diabetic chronic kidney disease: Secondary | ICD-10-CM | POA: Diagnosis not present

## 2022-09-29 DIAGNOSIS — E43 Unspecified severe protein-calorie malnutrition: Secondary | ICD-10-CM | POA: Diagnosis not present

## 2022-09-29 DIAGNOSIS — N1832 Chronic kidney disease, stage 3b: Secondary | ICD-10-CM | POA: Diagnosis not present

## 2022-09-29 DIAGNOSIS — G319 Degenerative disease of nervous system, unspecified: Secondary | ICD-10-CM | POA: Diagnosis not present

## 2022-09-29 DIAGNOSIS — L89151 Pressure ulcer of sacral region, stage 1: Secondary | ICD-10-CM | POA: Diagnosis not present

## 2022-09-29 DIAGNOSIS — L89892 Pressure ulcer of other site, stage 2: Secondary | ICD-10-CM | POA: Diagnosis not present

## 2022-09-29 DIAGNOSIS — G40909 Epilepsy, unspecified, not intractable, without status epilepticus: Secondary | ICD-10-CM | POA: Diagnosis not present

## 2022-09-29 DIAGNOSIS — I700 Atherosclerosis of aorta: Secondary | ICD-10-CM | POA: Diagnosis not present

## 2022-09-29 DIAGNOSIS — R64 Cachexia: Secondary | ICD-10-CM | POA: Diagnosis not present

## 2022-09-29 DIAGNOSIS — G9520 Unspecified cord compression: Secondary | ICD-10-CM | POA: Diagnosis not present

## 2022-09-30 DIAGNOSIS — R197 Diarrhea, unspecified: Secondary | ICD-10-CM | POA: Diagnosis not present

## 2022-10-01 DIAGNOSIS — G9341 Metabolic encephalopathy: Secondary | ICD-10-CM | POA: Diagnosis not present

## 2022-10-01 DIAGNOSIS — E1151 Type 2 diabetes mellitus with diabetic peripheral angiopathy without gangrene: Secondary | ICD-10-CM | POA: Diagnosis not present

## 2022-10-01 DIAGNOSIS — Z794 Long term (current) use of insulin: Secondary | ICD-10-CM | POA: Diagnosis not present

## 2022-10-01 DIAGNOSIS — L89152 Pressure ulcer of sacral region, stage 2: Secondary | ICD-10-CM | POA: Diagnosis not present

## 2022-10-01 DIAGNOSIS — E039 Hypothyroidism, unspecified: Secondary | ICD-10-CM | POA: Diagnosis not present

## 2022-10-01 DIAGNOSIS — J690 Pneumonitis due to inhalation of food and vomit: Secondary | ICD-10-CM | POA: Diagnosis not present

## 2022-10-01 DIAGNOSIS — E440 Moderate protein-calorie malnutrition: Secondary | ICD-10-CM | POA: Diagnosis not present

## 2022-10-01 DIAGNOSIS — Z79891 Long term (current) use of opiate analgesic: Secondary | ICD-10-CM | POA: Diagnosis not present

## 2022-10-01 DIAGNOSIS — E1159 Type 2 diabetes mellitus with other circulatory complications: Secondary | ICD-10-CM | POA: Diagnosis not present

## 2022-10-01 DIAGNOSIS — N182 Chronic kidney disease, stage 2 (mild): Secondary | ICD-10-CM | POA: Diagnosis not present

## 2022-10-01 DIAGNOSIS — G40909 Epilepsy, unspecified, not intractable, without status epilepticus: Secondary | ICD-10-CM | POA: Diagnosis not present

## 2022-10-01 DIAGNOSIS — F0390 Unspecified dementia without behavioral disturbance: Secondary | ICD-10-CM | POA: Diagnosis not present

## 2022-10-01 DIAGNOSIS — E11649 Type 2 diabetes mellitus with hypoglycemia without coma: Secondary | ICD-10-CM | POA: Diagnosis not present

## 2022-10-01 DIAGNOSIS — D631 Anemia in chronic kidney disease: Secondary | ICD-10-CM | POA: Diagnosis not present

## 2022-10-01 DIAGNOSIS — Z7982 Long term (current) use of aspirin: Secondary | ICD-10-CM | POA: Diagnosis not present

## 2022-10-01 DIAGNOSIS — R69 Illness, unspecified: Secondary | ICD-10-CM | POA: Diagnosis not present

## 2022-10-01 DIAGNOSIS — E1122 Type 2 diabetes mellitus with diabetic chronic kidney disease: Secondary | ICD-10-CM | POA: Diagnosis not present

## 2022-10-01 DIAGNOSIS — N179 Acute kidney failure, unspecified: Secondary | ICD-10-CM | POA: Diagnosis not present

## 2022-10-01 DIAGNOSIS — I152 Hypertension secondary to endocrine disorders: Secondary | ICD-10-CM | POA: Diagnosis not present

## 2022-10-02 ENCOUNTER — Other Ambulatory Visit: Payer: Self-pay

## 2022-10-02 ENCOUNTER — Emergency Department (HOSPITAL_COMMUNITY): Payer: Medicare HMO

## 2022-10-02 ENCOUNTER — Encounter (HOSPITAL_COMMUNITY): Payer: Self-pay

## 2022-10-02 ENCOUNTER — Inpatient Hospital Stay (HOSPITAL_COMMUNITY)
Admission: EM | Admit: 2022-10-02 | Discharge: 2022-10-05 | DRG: 872 | Disposition: A | Discharge status: Home Health | Payer: Medicare HMO | Attending: Internal Medicine | Admitting: Internal Medicine

## 2022-10-02 DIAGNOSIS — Z794 Long term (current) use of insulin: Secondary | ICD-10-CM | POA: Diagnosis not present

## 2022-10-02 DIAGNOSIS — G9341 Metabolic encephalopathy: Secondary | ICD-10-CM | POA: Diagnosis not present

## 2022-10-02 DIAGNOSIS — Z888 Allergy status to other drugs, medicaments and biological substances status: Secondary | ICD-10-CM

## 2022-10-02 DIAGNOSIS — F0390 Unspecified dementia without behavioral disturbance: Secondary | ICD-10-CM | POA: Diagnosis present

## 2022-10-02 DIAGNOSIS — I951 Orthostatic hypotension: Secondary | ICD-10-CM | POA: Diagnosis not present

## 2022-10-02 DIAGNOSIS — Z833 Family history of diabetes mellitus: Secondary | ICD-10-CM

## 2022-10-02 DIAGNOSIS — Z7982 Long term (current) use of aspirin: Secondary | ICD-10-CM | POA: Diagnosis not present

## 2022-10-02 DIAGNOSIS — Z87898 Personal history of other specified conditions: Secondary | ICD-10-CM

## 2022-10-02 DIAGNOSIS — E1159 Type 2 diabetes mellitus with other circulatory complications: Secondary | ICD-10-CM | POA: Diagnosis present

## 2022-10-02 DIAGNOSIS — Z79890 Hormone replacement therapy (postmenopausal): Secondary | ICD-10-CM

## 2022-10-02 DIAGNOSIS — L89621 Pressure ulcer of left heel, stage 1: Secondary | ICD-10-CM | POA: Diagnosis present

## 2022-10-02 DIAGNOSIS — M3390 Dermatopolymyositis, unspecified, organ involvement unspecified: Secondary | ICD-10-CM | POA: Diagnosis present

## 2022-10-02 DIAGNOSIS — I10 Essential (primary) hypertension: Secondary | ICD-10-CM | POA: Diagnosis present

## 2022-10-02 DIAGNOSIS — Z87891 Personal history of nicotine dependence: Secondary | ICD-10-CM

## 2022-10-02 DIAGNOSIS — E039 Hypothyroidism, unspecified: Secondary | ICD-10-CM | POA: Diagnosis present

## 2022-10-02 DIAGNOSIS — I152 Hypertension secondary to endocrine disorders: Secondary | ICD-10-CM | POA: Diagnosis not present

## 2022-10-02 DIAGNOSIS — L89121 Pressure ulcer of left upper back, stage 1: Secondary | ICD-10-CM | POA: Diagnosis present

## 2022-10-02 DIAGNOSIS — N179 Acute kidney failure, unspecified: Secondary | ICD-10-CM | POA: Diagnosis not present

## 2022-10-02 DIAGNOSIS — M810 Age-related osteoporosis without current pathological fracture: Secondary | ICD-10-CM | POA: Diagnosis present

## 2022-10-02 DIAGNOSIS — Z79899 Other long term (current) drug therapy: Secondary | ICD-10-CM

## 2022-10-02 DIAGNOSIS — E119 Type 2 diabetes mellitus without complications: Secondary | ICD-10-CM | POA: Diagnosis present

## 2022-10-02 DIAGNOSIS — E871 Hypo-osmolality and hyponatremia: Secondary | ICD-10-CM | POA: Diagnosis present

## 2022-10-02 DIAGNOSIS — Z66 Do not resuscitate: Secondary | ICD-10-CM | POA: Diagnosis present

## 2022-10-02 DIAGNOSIS — N3000 Acute cystitis without hematuria: Secondary | ICD-10-CM

## 2022-10-02 DIAGNOSIS — D631 Anemia in chronic kidney disease: Secondary | ICD-10-CM | POA: Diagnosis not present

## 2022-10-02 DIAGNOSIS — E7800 Pure hypercholesterolemia, unspecified: Secondary | ICD-10-CM | POA: Diagnosis present

## 2022-10-02 DIAGNOSIS — R051 Acute cough: Secondary | ICD-10-CM

## 2022-10-02 DIAGNOSIS — Z8249 Family history of ischemic heart disease and other diseases of the circulatory system: Secondary | ICD-10-CM

## 2022-10-02 DIAGNOSIS — M21611 Bunion of right foot: Secondary | ICD-10-CM | POA: Diagnosis present

## 2022-10-02 DIAGNOSIS — G8929 Other chronic pain: Secondary | ICD-10-CM | POA: Diagnosis present

## 2022-10-02 DIAGNOSIS — Z681 Body mass index (BMI) 19 or less, adult: Secondary | ICD-10-CM

## 2022-10-02 DIAGNOSIS — L89892 Pressure ulcer of other site, stage 2: Secondary | ICD-10-CM | POA: Diagnosis present

## 2022-10-02 DIAGNOSIS — R1312 Dysphagia, oropharyngeal phase: Secondary | ICD-10-CM | POA: Diagnosis present

## 2022-10-02 DIAGNOSIS — Z79891 Long term (current) use of opiate analgesic: Secondary | ICD-10-CM | POA: Diagnosis not present

## 2022-10-02 DIAGNOSIS — Z7952 Long term (current) use of systemic steroids: Secondary | ICD-10-CM

## 2022-10-02 DIAGNOSIS — G40909 Epilepsy, unspecified, not intractable, without status epilepticus: Secondary | ICD-10-CM | POA: Diagnosis present

## 2022-10-02 DIAGNOSIS — Z886 Allergy status to analgesic agent status: Secondary | ICD-10-CM

## 2022-10-02 DIAGNOSIS — L89152 Pressure ulcer of sacral region, stage 2: Secondary | ICD-10-CM | POA: Diagnosis present

## 2022-10-02 DIAGNOSIS — R059 Cough, unspecified: Secondary | ICD-10-CM | POA: Diagnosis not present

## 2022-10-02 DIAGNOSIS — Z743 Need for continuous supervision: Secondary | ICD-10-CM | POA: Diagnosis not present

## 2022-10-02 DIAGNOSIS — E1151 Type 2 diabetes mellitus with diabetic peripheral angiopathy without gangrene: Secondary | ICD-10-CM | POA: Diagnosis not present

## 2022-10-02 DIAGNOSIS — J690 Pneumonitis due to inhalation of food and vomit: Secondary | ICD-10-CM | POA: Diagnosis not present

## 2022-10-02 DIAGNOSIS — R5383 Other fatigue: Secondary | ICD-10-CM | POA: Diagnosis not present

## 2022-10-02 DIAGNOSIS — R64 Cachexia: Secondary | ICD-10-CM | POA: Diagnosis present

## 2022-10-02 DIAGNOSIS — Z90710 Acquired absence of both cervix and uterus: Secondary | ICD-10-CM

## 2022-10-02 DIAGNOSIS — K219 Gastro-esophageal reflux disease without esophagitis: Secondary | ICD-10-CM | POA: Diagnosis present

## 2022-10-02 DIAGNOSIS — E1122 Type 2 diabetes mellitus with diabetic chronic kidney disease: Secondary | ICD-10-CM | POA: Diagnosis not present

## 2022-10-02 DIAGNOSIS — Z1152 Encounter for screening for COVID-19: Secondary | ICD-10-CM

## 2022-10-02 DIAGNOSIS — Z882 Allergy status to sulfonamides status: Secondary | ICD-10-CM

## 2022-10-02 DIAGNOSIS — N390 Urinary tract infection, site not specified: Secondary | ICD-10-CM | POA: Diagnosis not present

## 2022-10-02 DIAGNOSIS — R69 Illness, unspecified: Secondary | ICD-10-CM | POA: Diagnosis not present

## 2022-10-02 DIAGNOSIS — E11649 Type 2 diabetes mellitus with hypoglycemia without coma: Secondary | ICD-10-CM | POA: Diagnosis not present

## 2022-10-02 DIAGNOSIS — M545 Low back pain, unspecified: Secondary | ICD-10-CM | POA: Diagnosis present

## 2022-10-02 DIAGNOSIS — M3313 Other dermatomyositis without myopathy: Secondary | ICD-10-CM | POA: Diagnosis present

## 2022-10-02 DIAGNOSIS — B370 Candidal stomatitis: Secondary | ICD-10-CM | POA: Diagnosis present

## 2022-10-02 DIAGNOSIS — D649 Anemia, unspecified: Secondary | ICD-10-CM | POA: Diagnosis present

## 2022-10-02 DIAGNOSIS — B952 Enterococcus as the cause of diseases classified elsewhere: Secondary | ICD-10-CM | POA: Diagnosis present

## 2022-10-02 DIAGNOSIS — Z881 Allergy status to other antibiotic agents status: Secondary | ICD-10-CM

## 2022-10-02 DIAGNOSIS — E440 Moderate protein-calorie malnutrition: Secondary | ICD-10-CM | POA: Diagnosis not present

## 2022-10-02 DIAGNOSIS — N182 Chronic kidney disease, stage 2 (mild): Secondary | ICD-10-CM | POA: Diagnosis not present

## 2022-10-02 DIAGNOSIS — A419 Sepsis, unspecified organism: Secondary | ICD-10-CM | POA: Diagnosis not present

## 2022-10-02 DIAGNOSIS — R8290 Unspecified abnormal findings in urine: Secondary | ICD-10-CM

## 2022-10-02 DIAGNOSIS — R509 Fever, unspecified: Secondary | ICD-10-CM | POA: Diagnosis not present

## 2022-10-02 DIAGNOSIS — E785 Hyperlipidemia, unspecified: Secondary | ICD-10-CM | POA: Diagnosis present

## 2022-10-02 DIAGNOSIS — E876 Hypokalemia: Secondary | ICD-10-CM | POA: Diagnosis not present

## 2022-10-02 DIAGNOSIS — I959 Hypotension, unspecified: Secondary | ICD-10-CM | POA: Diagnosis not present

## 2022-10-02 LAB — COMPREHENSIVE METABOLIC PANEL
ALT: 15 U/L (ref 0–44)
AST: 26 U/L (ref 15–41)
Albumin: 3 g/dL — ABNORMAL LOW (ref 3.5–5.0)
Alkaline Phosphatase: 86 U/L (ref 38–126)
Anion gap: 6 (ref 5–15)
BUN: 15 mg/dL (ref 8–23)
CO2: 27 mmol/L (ref 22–32)
Calcium: 9.5 mg/dL (ref 8.9–10.3)
Chloride: 98 mmol/L (ref 98–111)
Creatinine, Ser: 0.72 mg/dL (ref 0.44–1.00)
GFR, Estimated: >60 mL/min (ref >60)
Glucose, Bld: 224 mg/dL — ABNORMAL HIGH (ref 70–99)
Potassium: 4.6 mmol/L (ref 3.5–5.1)
Sodium: 131 mmol/L — ABNORMAL LOW (ref 135–145)
Total Bilirubin: 0.6 mg/dL (ref 0.3–1.2)
Total Protein: 6.8 g/dL (ref 6.5–8.1)

## 2022-10-02 LAB — URINALYSIS, ROUTINE W REFLEX MICROSCOPIC
Bilirubin Urine: NEGATIVE
Glucose, UA: NEGATIVE mg/dL
Hgb urine dipstick: NEGATIVE
Ketones, ur: NEGATIVE mg/dL
Nitrite: POSITIVE — AB
Protein, ur: NEGATIVE mg/dL
Specific Gravity, Urine: 1.005 (ref 1.005–1.030)
WBC, UA: >50 WBC/hpf (ref 0–5)
pH: 7 (ref 5.0–8.0)

## 2022-10-02 LAB — CBC WITH DIFFERENTIAL/PLATELET
Abs Immature Granulocytes: 0.22 10*3/uL — ABNORMAL HIGH (ref 0.00–0.07)
Basophils Absolute: 0 10*3/uL (ref 0.0–0.1)
Basophils Relative: 0 %
Eosinophils Absolute: 0.1 10*3/uL (ref 0.0–0.5)
Eosinophils Relative: 0 %
HCT: 30 % — ABNORMAL LOW (ref 36.0–46.0)
Hemoglobin: 10 g/dL — ABNORMAL LOW (ref 12.0–15.0)
Immature Granulocytes: 1 %
Lymphocytes Relative: 4 %
Lymphs Abs: 0.8 10*3/uL (ref 0.7–4.0)
MCH: 32.4 pg (ref 26.0–34.0)
MCHC: 33.3 g/dL (ref 30.0–36.0)
MCV: 97.1 fL (ref 80.0–100.0)
Monocytes Absolute: 0.5 10*3/uL (ref 0.1–1.0)
Monocytes Relative: 3 %
Neutro Abs: 17 10*3/uL — ABNORMAL HIGH (ref 1.7–7.7)
Neutrophils Relative %: 92 %
Platelets: 383 10*3/uL (ref 150–400)
RBC: 3.09 MIL/uL — ABNORMAL LOW (ref 3.87–5.11)
RDW: 14.5 % (ref 11.5–15.5)
WBC: 18.6 10*3/uL — ABNORMAL HIGH (ref 4.0–10.5)
nRBC: 0 % (ref 0.0–0.2)

## 2022-10-02 LAB — TSH: TSH: 0.607 u[IU]/mL (ref 0.350–4.500)

## 2022-10-02 LAB — RESP PANEL BY RT-PCR (RSV, FLU A&B, COVID)  RVPGX2
Influenza A by PCR: NEGATIVE
Influenza B by PCR: NEGATIVE
Resp Syncytial Virus by PCR: NEGATIVE
SARS Coronavirus 2 by RT PCR: NEGATIVE

## 2022-10-02 LAB — GLUCOSE, CAPILLARY: Glucose-Capillary: 228 mg/dL — ABNORMAL HIGH (ref 70–99)

## 2022-10-02 LAB — LACTIC ACID, PLASMA: Lactic Acid, Venous: 1.8 mmol/L (ref 0.5–1.9)

## 2022-10-02 MED ORDER — INSULIN ASPART 100 UNIT/ML IJ SOLN
0.0000 [IU] | Freq: Three times a day (TID) | INTRAMUSCULAR | Status: DC
  Administered 2022-10-03: 1 [IU] via SUBCUTANEOUS
  Administered 2022-10-03: 5 [IU] via SUBCUTANEOUS
  Administered 2022-10-03: 3 [IU] via SUBCUTANEOUS
  Administered 2022-10-04: 1 [IU] via SUBCUTANEOUS
  Administered 2022-10-04: 7 [IU] via SUBCUTANEOUS
  Filled 2022-10-02: qty 0.09

## 2022-10-02 MED ORDER — INSULIN ASPART 100 UNIT/ML IJ SOLN
0.0000 [IU] | Freq: Every day | INTRAMUSCULAR | Status: DC
  Administered 2022-10-02 – 2022-10-03 (×2): 2 [IU] via SUBCUTANEOUS
  Filled 2022-10-02: qty 0.05

## 2022-10-02 MED ORDER — SODIUM CHLORIDE 0.9 % IV SOLN: INTRAVENOUS | Status: DC

## 2022-10-02 MED ORDER — ACETAMINOPHEN 650 MG RE SUPP: 650.0000 mg | Freq: Four times a day (QID) | RECTAL | Status: DC | PRN

## 2022-10-02 MED ORDER — SODIUM CHLORIDE 0.9 % IV SOLN
1.0000 g | Freq: Once | INTRAVENOUS | Status: AC
  Administered 2022-10-02: 1 g via INTRAVENOUS
  Filled 2022-10-02: qty 10

## 2022-10-02 MED ORDER — SODIUM CHLORIDE 0.9 % IV BOLUS
1000.0000 mL | Freq: Once | INTRAVENOUS | Status: AC
  Administered 2022-10-02: 1000 mL via INTRAVENOUS

## 2022-10-02 MED ORDER — ENOXAPARIN SODIUM 40 MG/0.4ML IJ SOSY
40.0000 mg | PREFILLED_SYRINGE | INTRAMUSCULAR | Status: DC
  Administered 2022-10-02 – 2022-10-04 (×3): 40 mg via SUBCUTANEOUS
  Filled 2022-10-02 (×3): qty 0.4

## 2022-10-02 MED ORDER — ONDANSETRON HCL 4 MG/2ML IJ SOLN: 4.0000 mg | Freq: Four times a day (QID) | INTRAMUSCULAR | Status: DC | PRN

## 2022-10-02 MED ORDER — ACETAMINOPHEN 325 MG PO TABS: 650.0000 mg | ORAL_TABLET | Freq: Four times a day (QID) | ORAL | Status: DC | PRN

## 2022-10-02 MED ORDER — SODIUM CHLORIDE 0.9 % IV SOLN
1.0000 g | INTRAVENOUS | Status: DC
  Administered 2022-10-03 – 2022-10-04 (×2): 1 g via INTRAVENOUS
  Filled 2022-10-02 (×2): qty 10

## 2022-10-02 MED ORDER — ONDANSETRON HCL 4 MG PO TABS: 4.0000 mg | ORAL_TABLET | Freq: Four times a day (QID) | ORAL | Status: DC | PRN

## 2022-10-02 NOTE — Assessment & Plan Note (Addendum)
Aspiration precautions Dysphagia 3 diet

## 2022-10-02 NOTE — Progress Notes (Signed)
Patient admitted, she is alert and oriented to self. No sign of pain or distress. Medical and ADL information obtained from daughter at bedside. Safety measures in place.Will continue to observe.

## 2022-10-02 NOTE — Assessment & Plan Note (Signed)
A1C 8.9 in 10/23, update pending On SSI at home. SSI and bedtime SS with accuchecks QAC/HS

## 2022-10-02 NOTE — Assessment & Plan Note (Addendum)
89 year old presenting with one day history of lethargy and foul smelling urine found to be septic from UTI; however ,she is on chronic steroids which may falsely elevated WBC  -obs to telemetry  -given IVF in ED, continue gentle IVF -lactic acid wnl, cultures pending -continue rocephin, f/u on urine culture

## 2022-10-02 NOTE — Assessment & Plan Note (Addendum)
TSH wnl, continue home synthroid, 133mcg daily  At last hospitalization was found to be hyperthyroid and dose decreased TSH wnl, but would recheck in 4-6 weeks outpatient with free T4.

## 2022-10-02 NOTE — Assessment & Plan Note (Signed)
Delirium precautions 

## 2022-10-02 NOTE — Assessment & Plan Note (Signed)
Continue steroid and methotrexate

## 2022-10-02 NOTE — Assessment & Plan Note (Signed)
At baseline, continue to monitor  Family reported 2BM with blood, fecal occult pending

## 2022-10-02 NOTE — ED Provider Notes (Signed)
89 yo female presenting with complaint of fever at home, fatigue, low energy, sleeping all day  Some dementia at baseline, worse at night Has chronic dysphagia, aspiration risk  Not hypoxic in the ED  Pending labs, viral testing, UA testing, xray  Physical Exam  BP (!) 145/69   Pulse (!) 102   Temp 99.6 F (37.6 C) (Rectal)   Resp 18   Ht 5\' 6"  (1.676 m)   Wt 45.4 kg   SpO2 98%   BMI 16.14 kg/m   Physical Exam  Procedures  Procedures  ED Course / MDM    Medical Decision Making Amount and/or Complexity of Data Reviewed Labs: ordered. Radiology: ordered.  Risk Decision regarding hospitalization.   UA is consistent with infection.  Given her foul-smelling urine I think this is reasonable to treat as a source of infection.  The patient does meet SIRS criteria.  Blood cultures were already sent.  Lactate is within normal limits.  I doubt severe sepsis.  No evidence of septic shock.  She already received 1 L of fluid, we will start her on Rocephin and admit her to the hospital.       Wyvonnia Dusky, MD 10/02/22 [phone]

## 2022-10-02 NOTE — ED Provider Notes (Signed)
[place] AT [facility] Provider Note   CSN: [id number] Arrival date & time: 10/02/22  1402     History  Chief Complaint  Patient presents with   Fever    [name] [name] is a 89 y.o. female.  The history is provided by the patient and medical records. No language interpreter was used.  Fever Max temp prior to arrival:  100.3 Temp source:  Subjective Severity:  Moderate Onset quality:  Gradual Duration:  3 days Timing:  Constant Progression:  Waxing and waning Chronicity:  New Worsened by:  Nothing Associated symptoms: chills, cough and somnolence   Associated symptoms: no chest pain, no confusion, no congestion, no diarrhea, no dysuria (foul smelling urine per family), no headaches, no nausea, no rash and no vomiting        Home Medications Prior to Admission medications   Medication Sig Start Date End Date Taking? Authorizing Provider  acetaminophen (TYLENOL) 650 MG CR tablet Take 650 mg by mouth every 8 (eight) hours as needed for pain.    [provider]  BAYER LOW DOSE 81 MG EC tablet Take 81 mg by mouth daily. Swallow whole.    [provider]  cetirizine (ZYRTEC) 10 MG tablet Take 10 mg by mouth daily as needed (itching).    [provider]  Cholecalciferol (VITAMIN D-3 PO) Take 1,000 Units by mouth daily.    [provider]  Famotidine (PEPCID AC PO) Take 20 mg by mouth at bedtime.    [provider]  folic acid (FOLVITE) 1 MG tablet Take 1 mg by mouth at bedtime.    [provider]  insulin aspart (NOVOLOG FLEXPEN) 100 UNIT/ML FlexPen Inject 2-10 Units into the skin See admin instructions. Inject 2-10 units into the skin three times a day with meals, per sliding scale:  Breakfast: BGL 80-199 = 8 units; 200-299 = 9 units; 300 or greater = 10 units Lunch: BGL 80-199 = 5 units; 200-299 = 6 units; 300 or greater = 7 units Supper/evening meal: BGL 80-199 = 2 units; 200-299 = 3  units; 300 or greater = 4 units    [provider]  insulin detemir (LEVEMIR FLEXTOUCH) 100 UNIT/ML FlexPen Inject 3-7 Units into the skin See admin instructions. Inject 7 units into the skin in the morning and 3 units at bedtime if BGL is 200 or below and 4 units if BGL is greater than 200    [provider]  levETIRAcetam (KEPPRA) 100 MG/ML solution Take 7.5 mLs (750 mg total) by mouth 2 (two) times daily. 09/11/22   Donne Hazel, MD  levothyroxine (SYNTHROID) 100 MCG tablet Take 1 tablet (100 mcg total) by mouth daily before breakfast. 09/12/22 10/12/22  Donne Hazel, MD  liver oil-zinc oxide (DESITIN) 40 % ointment Apply topically 3 (three) times daily. Patient taking differently: Apply 1 Application topically as needed for irritation. 09/16/21   Lavina Hamman, MD  loperamide (IMODIUM A-D) 2 MG tablet Take 2 mg by mouth 4 (four) times daily as needed for diarrhea or loose stools.    [provider]  methotrexate (RHEUMATREX) 2.5 MG tablet Take 1 tablet (2.5 mg total) by mouth once a week. Caution:Chemotherapy. Protect from light. Patient taking differently: Take 15 mg by mouth every Saturday. Caution:Chemotherapy. Protect from light. 01/27/22   Charlynne Cousins, MD  metoprolol tartrate (LOPRESSOR) 25 MG tablet Take 1 tablet (25 mg total) by mouth 2 (two) times daily. 01/20/22  Charlynne Cousins, MD  midodrine (PROAMATINE) 5 MG tablet Take 1 tablet (5 mg total) by mouth 3 (three) times daily with meals. Patient taking differently: Take 5 mg by mouth 2 (two) times daily with a meal. 05/13/22   Cherene Altes, MD  polyethylene glycol (MIRALAX / GLYCOLAX) 17 g packet Take 17 g by mouth daily. Patient taking differently: Take 17 g by mouth daily as needed for mild constipation. 09/16/21   Lavina Hamman, MD  predniSONE (DELTASONE) 5 MG tablet Take 5 mg by mouth daily with breakfast.    [provider]  triamcinolone ointment (KENALOG) 0.1 % Apply 1  Application topically 2 (two) times daily as needed (itching, rash). 07/16/22   [provider]      Allergies    Nsaids, Biaxin [clarithromycin], Bactrim [sulfamethoxazole-trimethoprim], Lisinopril, and Sulfa antibiotics    Review of Systems   Review of Systems  Constitutional:  Positive for chills, fatigue and fever. Negative for diaphoresis.  HENT:  Negative for congestion.   Respiratory:  Positive for cough. Negative for chest tightness, shortness of breath and wheezing.   Cardiovascular:  Negative for chest pain, palpitations and leg swelling.  Gastrointestinal:  Negative for abdominal pain, constipation, diarrhea, nausea and vomiting.  Genitourinary:  Negative for dysuria (foul smelling urine per family), flank pain and frequency.       Foul smelling urine and darker  Musculoskeletal:  Negative for back pain and neck pain.  Skin:  Negative for rash.  Neurological:  Negative for dizziness, light-headedness and headaches.  Psychiatric/Behavioral:  Negative for confusion.   All other systems reviewed and are negative.   Physical Exam Updated Vital Signs BP (!) 145/69   Pulse (!) 102   Temp 99.6 F (37.6 C) (Rectal)   Resp 18   Ht 5\' 6"  (1.676 m)   Wt 45.4 kg   SpO2 98%   BMI 16.14 kg/m  Physical Exam Vitals and nursing note reviewed.  Constitutional:      General: She is not in acute distress.    Appearance: She is well-developed. She is not ill-appearing, toxic-appearing or diaphoretic.     Comments: Sleepy but answer questions  HENT:     Head: Normocephalic and atraumatic.     Nose: No congestion or rhinorrhea.     Mouth/Throat:     Mouth: Mucous membranes are dry.     Pharynx: No oropharyngeal exudate or posterior oropharyngeal erythema.  Eyes:     Extraocular Movements: Extraocular movements intact.     Conjunctiva/sclera: Conjunctivae normal.     Pupils: Pupils are equal, round, and reactive to light.  Cardiovascular:     Rate and Rhythm: Regular  rhythm. Tachycardia present.     Pulses: Normal pulses.     Heart sounds: No murmur heard. Pulmonary:     Effort: Pulmonary effort is normal. No respiratory distress.     Breath sounds: Rhonchi present. No wheezing or rales.  Chest:     Chest wall: No tenderness.  Abdominal:     General: Abdomen is flat.     Palpations: Abdomen is soft.     Tenderness: There is no abdominal tenderness. There is no guarding or rebound.  Musculoskeletal:        General: No swelling or tenderness.     Cervical back: Neck supple. No tenderness.     Right lower leg: No edema.     Left lower leg: No edema.  Skin:    General: Skin is warm and  dry.     Capillary Refill: Capillary refill takes less than 2 seconds.     Findings: No erythema or rash.  Neurological:     General: No focal deficit present.     Sensory: No sensory deficit.     Motor: No weakness.  Psychiatric:        Mood and Affect: Mood normal.     ED Results / Procedures / Treatments   Labs (all labs ordered are listed, but only abnormal results are displayed) Labs Reviewed  URINE CULTURE  CULTURE, BLOOD (ROUTINE X 2)  CULTURE, BLOOD (ROUTINE X 2)  RESP PANEL BY RT-PCR (RSV, FLU A&B, COVID)  RVPGX2  CBC WITH DIFFERENTIAL/PLATELET  COMPREHENSIVE METABOLIC PANEL  LACTIC ACID, PLASMA  LACTIC ACID, PLASMA  URINALYSIS, ROUTINE W REFLEX MICROSCOPIC  TSH    EKG None  Radiology No results found.  Procedures Procedures    Medications Ordered in ED Medications  sodium chloride 0.9 % bolus 1,000 mL (1,000 mLs Intravenous New Bag/Given 10/02/22 1617)    ED Course/ Medical Decision Making/ A&P                             Medical Decision Making Amount and/or Complexity of Data Reviewed Labs: ordered. Radiology: ordered.   Adryan MEREDETH MELLEMA is a 89 y.o. female with a past medical history significant for hypertension, diabetes, GERD, vertigo, hyperlipidemia, previous small bowel obstruction, CKD, PAD, dementia per family,  previous aspiration pneumonia, and chronic dysphagia who presents with fevers, chills, cough, foul-smelling urine, altered mental status, and somnolence.  According to patient's family, for the last few days, patient has been not acting normally.  Patient has been for fatigued and tired and sleeping all day.  She is not taking as much oral intake and was febrile earlier at 100.3 orally.  Patient has not had nausea or vomiting but has had persistent cough.  She has not complained of chest pain or shortness of breath and is not complaining of headache or neck pain.  Patient has had no falls or trauma.  Patient's urine has smelled and look different per family.  No reported constipation or diarrhea.  No sick contacts.  Family is concerned due to altered mental status and not being her normal self.  She is somnolent.  Family reports that with her chronic dysphagia she occasionally has aspiration and although they do not know a specific episode they are concerned it could be recurrent aspiration pneumonia with her cough and fevers.  On my exam, lungs were coarse bilaterally.  Patient denies any chest pain or shortness of breath for me but did have some coughing.  Chest and abdomen nontender.  Pulses present in extremities.  Patient was able to feel and move both feet and could squeeze both hands.  Dry mucous membranes.  Patient resting but was tachycardic and tachypneic for me.  She is warm to the touch but rectal temperature was only 99.  Clinically suspect she has either UTI or pneumonia causing her altered mental status.  Will get screening labs, chest x-ray, and urinalysis.  We had a shared decision-making conversation given lack of any headache, neck pain, or neurologic deficits, we agreed to hold on CT head at this time.  Family agrees.  Anticipate reassessment after workup to determine disposition but given altered mental status and somnolence with her age, patient may require admission.        Final  Clinical Impression(s) /  ED Diagnoses Final diagnoses:  Somnolence  Fever, unspecified fever cause  Fatigue, unspecified type  Acute cough  Foul smelling urine     Clinical Impression: 1. Somnolence   2. Fever, unspecified fever cause   3. Fatigue, unspecified type   4. Acute cough   5. Foul smelling urine     Disposition: Care transferred: Continue to await reassessment after workup completion.  Due to altered mental status and suspected infection, anticipate admission.  This note was prepared with assistance of Systems analyst. Occasional wrong-word or sound-a-like substitutions may have occurred due to the inherent limitations of voice recognition software.     Antonieta Slaven, Gwenyth Allegra, MD 10/02/22 [phone]

## 2022-10-02 NOTE — Assessment & Plan Note (Addendum)
Continue keppra, recently had dose increased to 750mg  BID after hospitalization a few weeks ago for AMS; however, she can not tolerate this dose at home and has SE so PCP decreased back down to 500mg  BID  EEG with no seizure activity Seizure precautions

## 2022-10-02 NOTE — H&P (Signed)
History and Physical    Patient: [name] [name] [id number] DOB: 29-Apr-1934 DOA: 10/02/2022 DOS: the patient was seen and examined on 10/03/2022 PCP: Charlane Ferretti, MD  Patient coming from: Home - lives with her daughter.  Uses a walker and cane to ambulate.    Chief Complaint: confusion/weakness  HPI: ABBRIELLA [name] is a 89 y.o. female with medical history significant of CKD, T2DM, GERD, HLD, HTN, hypothyroidism, OAB, chronic dysphagia, history of seizures, dementia who presented with AMS and lethargy.  Family also noticed her urine was cloudy at home sometime last week, but home test was negative for UTI. She also states she has pain with urinating. No CVA tenderness. Her daughter tells me that symptoms started yesterday.  She was lethargic and sluggish and woke up much later in the day. Today she started to have some confusion and had a hard time communicating. She was weak and hot to touch and they were concerned for possible fever. Her appetite is about baseline.   Family also states she had 2BM with some blood, but have not seen any since that time.   Baseline cognitively: She can talk and respond. Follows commands. She does sundown.   Denies any vision changes/headaches, chest pain or palpitations, shortness of breath or cough, abdominal pain, N/V/D, or leg swelling.    She does not smoke or drink alcohol.   ER Course:  vitals: temp: 99.6, bp: 145/69, HR: 102, RR: 18, oxygen: 98%RA Pertinent labs: WBC: 18.6, hgb: 10.0, sodium: 131, UA: +nitrite, large LE,  CXR: no acute finding In ED: given 1L IVF bolus and started on rocephin   Review of Systems: As mentioned in the history of present illness. All other systems reviewed and are negative. Past Medical History:  Diagnosis Date   Arthritis    "knees, legs" (06/10/2016)   Ascites    Chronic kidney disease    "related to my diabetes"   Chronic lower back pain    Diabetes mellitus without complication (HCC)    Eczema     GERD (gastroesophageal reflux disease)    High cholesterol    Hyperlipidemia    Hypertension    Hypothyroidism    OAB (overactive bladder)    Osteoporosis    Thyroid disease    Type II diabetes mellitus ([place])    Urticaria    Past Surgical History:  Procedure Laterality Date   ABDOMINAL HYSTERECTOMY     KNEE ARTHROSCOPY     LAPAROTOMY N/A 12/03/2016   Procedure: EXPLORATORY LAPAROTOMY, LYSIS OF ADHESIONS;  Surgeon: Armandina Gemma, MD;  Location: [facility];  Service: General;  Laterality: N/A;   LOOP RECORDER INSERTION N/A 08/23/2019   Procedure: LOOP RECORDER INSERTION;  Surgeon: Evans Lance, MD;  Location: [facility];  Service: Cardiovascular;  Laterality: N/A;   vocal cord polyps     Social History:  reports that she has never smoked. She quit smokeless tobacco use about 39 years ago.  Her smokeless tobacco use included chew. She reports that she does not drink alcohol and does not use drugs.  Allergies  Allergen Reactions   Nsaids Other (See Comments)    No NSAIDS due to kidney function- ESPECIALLY ibuprofen or Aleve   Biaxin [Clarithromycin] Hives   Bactrim [Sulfamethoxazole-Trimethoprim] Hives and Rash   Lisinopril Cough   Sulfa Antibiotics Hives, Rash and Other (See Comments)    NO sulfa-based meds!!     Family History  Problem Relation Age of Onset   Diabetes Mother  Hypertension Mother    Asthma Father    Diabetes Sister    Stomach cancer Sister    Diabetes Brother    Allergic rhinitis Neg Hx    Eczema Neg Hx    Urticaria Neg Hx     Prior to Admission medications   Medication Sig Start Date End Date Taking? Authorizing Provider  acetaminophen (TYLENOL) 650 MG CR tablet Take 650 mg by mouth every 8 (eight) hours as needed for pain.    [provider]  BAYER LOW DOSE 81 MG EC tablet Take 81 mg by mouth daily. Swallow whole.    [provider]  cetirizine (ZYRTEC) 10 MG tablet Take 10 mg by mouth daily as needed (itching).    [provider]  Cholecalciferol (VITAMIN D-3 PO) Take 1,000 Units by mouth daily.    [provider]  Famotidine (PEPCID AC PO) Take 20 mg by mouth at bedtime.    [provider]  folic acid (FOLVITE) 1 MG tablet Take 1 mg by mouth at bedtime.    [provider]  insulin aspart (NOVOLOG FLEXPEN) 100 UNIT/ML FlexPen Inject 2-10 Units into the skin See admin instructions. Inject 2-10 units into the skin three times a day with meals, per sliding scale:  Breakfast: BGL 80-199 = 8 units; 200-299 = 9 units; 300 or greater = 10 units Lunch: BGL 80-199 = 5 units; 200-299 = 6 units; 300 or greater = 7 units Supper/evening meal: BGL 80-199 = 2 units; 200-299 = 3 units; 300 or greater = 4 units    [provider]  insulin detemir (LEVEMIR FLEXTOUCH) 100 UNIT/ML FlexPen Inject 3-7 Units into the skin See admin instructions. Inject 7 units into the skin in the morning and 3 units at bedtime if BGL is 200 or below and 4 units if BGL is greater than 200    [provider]  levETIRAcetam (KEPPRA) 100 MG/ML solution Take 7.5 mLs (750 mg total) by mouth 2 (two) times daily. 09/11/22   Donne Hazel, MD  levothyroxine (SYNTHROID) 100 MCG tablet Take 1 tablet (100 mcg total) by mouth daily before breakfast. 09/12/22 10/12/22  Donne Hazel, MD  liver oil-zinc oxide (DESITIN) 40 % ointment Apply topically 3 (three) times daily. Patient taking differently: Apply 1 Application topically as needed for irritation. 09/16/21   Lavina Hamman, MD  loperamide (IMODIUM A-D) 2 MG tablet Take 2 mg by mouth 4 (four) times daily as needed for diarrhea or loose stools.    [provider]  methotrexate (RHEUMATREX) 2.5 MG tablet Take 1 tablet (2.5 mg total) by mouth once a week. Caution:Chemotherapy. Protect from light. Patient taking differently: Take 15 mg by mouth every Saturday. Caution:Chemotherapy. Protect from light. 01/27/22   Charlynne Cousins, MD  metoprolol tartrate  (LOPRESSOR) 25 MG tablet Take 1 tablet (25 mg total) by mouth 2 (two) times daily. 01/20/22   Charlynne Cousins, MD  midodrine (PROAMATINE) 5 MG tablet Take 1 tablet (5 mg total) by mouth 3 (three) times daily with meals. Patient taking differently: Take 5 mg by mouth 2 (two) times daily with a meal. 05/13/22   Cherene Altes, MD  polyethylene glycol (MIRALAX / GLYCOLAX) 17 g packet Take 17 g by mouth daily. Patient taking differently: Take 17 g by mouth daily as needed for mild constipation. 09/16/21   Lavina Hamman, MD  predniSONE (DELTASONE) 5 MG tablet Take 5 mg by mouth daily with breakfast.  [provider]  triamcinolone ointment (KENALOG) 0.1 % Apply 1 Application topically 2 (two) times daily as needed (itching, rash). 07/16/22   [provider]    Physical Exam: Vitals:   10/02/22 1420 10/02/22 1700 10/02/22 1833 10/02/22 2017  BP:  (!) 153/64 (!) 146/72 (!) 166/68  Pulse:  70 70 72  Resp:  12 19 18   Temp: 99.6 F (37.6 C)  98.5 F (36.9 C) 98.1 F (36.7 C)  TempSrc: Rectal  Oral Oral  SpO2:  98% 96% 95%  Weight:      Height:       General:  Appears calm and comfortable and is in NAD. cachetic Eyes:  PERRL, EOMI, normal lids, iris ENT:  grossly normal hearing, lips & tongue, mmm; edentulous. Thrush on tongue  Neck:  no LAD, masses or thyromegaly; no carotid bruits Cardiovascular:  RRR, no m/r/g. No LE edema.  Respiratory:   CTA bilaterally with no wheezes/rales/rhonchi.  Normal respiratory effort. Abdomen:  soft, NT, ND, NABS Back:   normal alignment, no CVAT Skin:  no rash or induration seen on limited exam Musculoskeletal:  grossly normal tone BUE/BLE, good ROM, no bony abnormality Lower extremity:  No LE edema.  Limited foot exam with no ulcerations.  2+ distal pulses. Psychiatric:  grossly normal mood and affect, speech fluent and appropriate, AOx3 Neurologic:  CN 2-12 grossly intact, moves all extremities in coordinated fashion, sensation  intact   Radiological Exams on Admission: Independently reviewed - see discussion in A/P where applicable  DG Chest Portable 1 View  Result Date: 10/02/2022 CLINICAL DATA:  Cough, fever, possible aspiration, chronic dysphagia EXAM: PORTABLE CHEST 1 VIEW COMPARISON:  09/10/2022 FINDINGS: The heart size and mediastinal contours are within normal limits. Left chest implantable loop recorder. Both lungs are clear. The visualized skeletal structures are unremarkable. IMPRESSION: No acute abnormality of the lungs in AP portable projection. Electronically Signed   By: Delanna Ahmadi M.D.   On: 10/02/2022 17:30    EKG: pending    Labs on Admission: I have personally reviewed the available labs and imaging studies at the time of the admission.  Pertinent labs:    WBC: 18.6,  hgb: 10.0,  sodium: 131,  UA: +nitrite, large LE,  Assessment and Plan: Principal Problem:   Sepsis due to urinary tract infection (HCC) Active Problems:   Hyponatremia   Oral thrush   Normocytic anemia   History of seizure   Oropharyngeal dysphagia   Insulin dependent type 2 diabetes mellitus ([place])   Hypertension with hx of orthostatic hypotension   Hypothyroidism, acquired   Dermatopolymyositis, unspecified, organ involvement unspecified ([place])   GERD   Dementia without behavioral disturbance (HCC)    Assessment and Plan: * Sepsis due to urinary tract infection ([place]) 89 year old presenting with one day history of lethargy and foul smelling urine found to be septic from UTI; however ,she is on chronic steroids which may falsely elevated WBC  -obs to telemetry  -given IVF in ED, continue gentle IVF -lactic acid wnl, cultures pending -continue rocephin, f/u on urine culture    Hyponatremia Sodium corrects to 134 for glucose Gentle hydration, trend   Oral thrush Nystatin swish and swallow   Normocytic anemia At baseline, continue to monitor  Family reported 2BM with blood, fecal occult pending    History of seizure Continue keppra, recently had dose increased to 750mg  BID after hospitalization a few weeks ago for AMS; however, she can not tolerate this dose at home  and has SE so PCP decreased back down to 500mg  BID  EEG with no seizure activity Seizure precautions   Oropharyngeal dysphagia Aspiration precautions Dysphagia 3 diet   Insulin dependent type 2 diabetes mellitus (HCC) A1C 8.9 in 10/23, update pending On SSI at home. SSI and bedtime SS with accuchecks QAC/HS  Hypertension with hx of orthostatic hypotension Lopressor recently stopped due to soft BP Continue midodrine   Hypothyroidism, acquired TSH wnl, continue home synthroid, 130mcg daily  At last hospitalization was found to be hyperthyroid and dose decreased TSH wnl, but would recheck in 4-6 weeks outpatient with free T4.   Dermatopolymyositis, unspecified, organ involvement unspecified ([place]) Continue steroid and methotrexate   GERD Continue pepcid AC   Dementia without behavioral disturbance ([place]) Delirium precautions     Advance Care Planning:   Code Status: DNR   Consults: none   DVT Prophylaxis: lovenox   Family Communication: updated daughters by phone.   Severity of Illness: The appropriate patient status for this patient is OBSERVATION. Observation status is judged to be reasonable and necessary in order to provide the required intensity of service to ensure the patient's safety. The patient's presenting symptoms, physical exam findings, and initial radiographic and laboratory data in the context of their medical condition is felt to place them at decreased risk for further clinical deterioration. Furthermore, it is anticipated that the patient will be medically stable for discharge from the hospital within 2 midnights of admission.   Author: Orma Flaming, MD 10/03/2022 12:18 AM  For on call review [email or web address].

## 2022-10-02 NOTE — ED Triage Notes (Signed)
Patient brought in by EMS due to fever. Family reports patient is more lethargic than normal. Per EMS report, patient had low grade fever and was tachycardic. Received 1L LR in route. Pt has no complaints.

## 2022-10-02 NOTE — Assessment & Plan Note (Signed)
Sodium corrects to 134 for glucose Gentle hydration, trend

## 2022-10-02 NOTE — Assessment & Plan Note (Signed)
Continue pepcid AC

## 2022-10-02 NOTE — Assessment & Plan Note (Addendum)
Lopressor recently stopped due to soft BP Continue midodrine

## 2022-10-03 DIAGNOSIS — B952 Enterococcus as the cause of diseases classified elsewhere: Secondary | ICD-10-CM | POA: Diagnosis not present

## 2022-10-03 DIAGNOSIS — I10 Essential (primary) hypertension: Secondary | ICD-10-CM | POA: Diagnosis not present

## 2022-10-03 DIAGNOSIS — L89621 Pressure ulcer of left heel, stage 1: Secondary | ICD-10-CM | POA: Diagnosis not present

## 2022-10-03 DIAGNOSIS — D649 Anemia, unspecified: Secondary | ICD-10-CM | POA: Diagnosis not present

## 2022-10-03 DIAGNOSIS — L89152 Pressure ulcer of sacral region, stage 2: Secondary | ICD-10-CM | POA: Diagnosis not present

## 2022-10-03 DIAGNOSIS — B370 Candidal stomatitis: Secondary | ICD-10-CM | POA: Diagnosis not present

## 2022-10-03 DIAGNOSIS — E039 Hypothyroidism, unspecified: Secondary | ICD-10-CM | POA: Diagnosis present

## 2022-10-03 DIAGNOSIS — E119 Type 2 diabetes mellitus without complications: Secondary | ICD-10-CM | POA: Diagnosis not present

## 2022-10-03 DIAGNOSIS — N390 Urinary tract infection, site not specified: Secondary | ICD-10-CM | POA: Diagnosis present

## 2022-10-03 DIAGNOSIS — M3390 Dermatopolymyositis, unspecified, organ involvement unspecified: Secondary | ICD-10-CM | POA: Diagnosis not present

## 2022-10-03 DIAGNOSIS — N3000 Acute cystitis without hematuria: Secondary | ICD-10-CM | POA: Diagnosis not present

## 2022-10-03 DIAGNOSIS — E871 Hypo-osmolality and hyponatremia: Secondary | ICD-10-CM | POA: Diagnosis not present

## 2022-10-03 DIAGNOSIS — Z794 Long term (current) use of insulin: Secondary | ICD-10-CM | POA: Diagnosis not present

## 2022-10-03 DIAGNOSIS — A419 Sepsis, unspecified organism: Secondary | ICD-10-CM | POA: Diagnosis not present

## 2022-10-03 DIAGNOSIS — F0390 Unspecified dementia without behavioral disturbance: Secondary | ICD-10-CM | POA: Diagnosis not present

## 2022-10-03 DIAGNOSIS — G40909 Epilepsy, unspecified, not intractable, without status epilepticus: Secondary | ICD-10-CM | POA: Diagnosis not present

## 2022-10-03 DIAGNOSIS — R1312 Dysphagia, oropharyngeal phase: Secondary | ICD-10-CM | POA: Diagnosis not present

## 2022-10-03 DIAGNOSIS — E876 Hypokalemia: Secondary | ICD-10-CM | POA: Diagnosis not present

## 2022-10-03 DIAGNOSIS — L89892 Pressure ulcer of other site, stage 2: Secondary | ICD-10-CM | POA: Diagnosis not present

## 2022-10-03 DIAGNOSIS — R64 Cachexia: Secondary | ICD-10-CM | POA: Diagnosis not present

## 2022-10-03 DIAGNOSIS — E7800 Pure hypercholesterolemia, unspecified: Secondary | ICD-10-CM | POA: Diagnosis not present

## 2022-10-03 DIAGNOSIS — L89121 Pressure ulcer of left upper back, stage 1: Secondary | ICD-10-CM | POA: Diagnosis not present

## 2022-10-03 DIAGNOSIS — Z66 Do not resuscitate: Secondary | ICD-10-CM | POA: Diagnosis not present

## 2022-10-03 DIAGNOSIS — Z681 Body mass index (BMI) 19 or less, adult: Secondary | ICD-10-CM | POA: Diagnosis not present

## 2022-10-03 DIAGNOSIS — M21611 Bunion of right foot: Secondary | ICD-10-CM | POA: Diagnosis not present

## 2022-10-03 DIAGNOSIS — Z1152 Encounter for screening for COVID-19: Secondary | ICD-10-CM | POA: Diagnosis not present

## 2022-10-03 LAB — CBC
HCT: 28.1 % — ABNORMAL LOW (ref 36.0–46.0)
Hemoglobin: 9.4 g/dL — ABNORMAL LOW (ref 12.0–15.0)
MCH: 32.6 pg (ref 26.0–34.0)
MCHC: 33.5 g/dL (ref 30.0–36.0)
MCV: 97.6 fL (ref 80.0–100.0)
Platelets: 348 10*3/uL (ref 150–400)
RBC: 2.88 MIL/uL — ABNORMAL LOW (ref 3.87–5.11)
RDW: 14.4 % (ref 11.5–15.5)
WBC: 14 10*3/uL — ABNORMAL HIGH (ref 4.0–10.5)
nRBC: 0 % (ref 0.0–0.2)

## 2022-10-03 LAB — BASIC METABOLIC PANEL
Anion gap: 8 (ref 5–15)
BUN: 13 mg/dL (ref 8–23)
CO2: 24 mmol/L (ref 22–32)
Calcium: 9.1 mg/dL (ref 8.9–10.3)
Chloride: 97 mmol/L — ABNORMAL LOW (ref 98–111)
Creatinine, Ser: 0.71 mg/dL (ref 0.44–1.00)
GFR, Estimated: >60 mL/min (ref >60)
Glucose, Bld: 312 mg/dL — ABNORMAL HIGH (ref 70–99)
Potassium: 4 mmol/L (ref 3.5–5.1)
Sodium: 129 mmol/L — ABNORMAL LOW (ref 135–145)

## 2022-10-03 LAB — GLUCOSE, CAPILLARY
Glucose-Capillary: 147 mg/dL — ABNORMAL HIGH (ref 70–99)
Glucose-Capillary: 235 mg/dL — ABNORMAL HIGH (ref 70–99)
Glucose-Capillary: 296 mg/dL — ABNORMAL HIGH (ref 70–99)
Glucose-Capillary: 291 mg/dL — ABNORMAL HIGH (ref 70–99)
Glucose-Capillary: 204 mg/dL — ABNORMAL HIGH (ref 70–99)

## 2022-10-03 MED ORDER — LEVOTHYROXINE SODIUM 100 MCG PO TABS
100.0000 ug | ORAL_TABLET | Freq: Every day | ORAL | Status: DC
  Administered 2022-10-03 – 2022-10-05 (×3): 100 ug via ORAL
  Filled 2022-10-03 (×3): qty 1

## 2022-10-03 MED ORDER — NYSTATIN 100000 UNIT/ML MT SUSP
5.0000 mL | Freq: Four times a day (QID) | OROMUCOSAL | Status: DC
  Administered 2022-10-03 – 2022-10-05 (×9): 500000 [IU] via ORAL
  Filled 2022-10-03 (×8): qty 5

## 2022-10-03 MED ORDER — FAMOTIDINE 20 MG PO TABS
20.0000 mg | ORAL_TABLET | Freq: Every day | ORAL | Status: DC
  Administered 2022-10-03 – 2022-10-04 (×2): 20 mg via ORAL
  Filled 2022-10-03 (×2): qty 1

## 2022-10-03 MED ORDER — PHENOL 1.4 % MT LIQD: 1.0000 | OROMUCOSAL | Status: DC | PRN

## 2022-10-03 MED ORDER — LORATADINE 10 MG PO TABS
10.0000 mg | ORAL_TABLET | Freq: Every day | ORAL | Status: DC
  Administered 2022-10-03 – 2022-10-04 (×2): 10 mg via ORAL
  Filled 2022-10-03: qty 1

## 2022-10-03 MED ORDER — MIDODRINE HCL 5 MG PO TABS
2.5000 mg | ORAL_TABLET | Freq: Three times a day (TID) | ORAL | Status: DC
  Administered 2022-10-03 – 2022-10-05 (×7): 2.5 mg via ORAL
  Filled 2022-10-03 (×7): qty 1

## 2022-10-03 MED ORDER — FOLIC ACID 1 MG PO TABS
1.0000 mg | ORAL_TABLET | Freq: Every day | ORAL | Status: DC
  Administered 2022-10-03 – 2022-10-04 (×2): 1 mg via ORAL
  Filled 2022-10-03 (×2): qty 1

## 2022-10-03 MED ORDER — SODIUM CHLORIDE 0.9 % IV SOLN: INTRAVENOUS | Status: DC

## 2022-10-03 MED ORDER — LEVETIRACETAM 100 MG/ML PO SOLN
500.0000 mg | Freq: Two times a day (BID) | ORAL | Status: DC
  Administered 2022-10-03 – 2022-10-05 (×6): 500 mg via ORAL
  Filled 2022-10-03 (×6): qty 5

## 2022-10-03 MED ORDER — PREDNISONE 5 MG PO TABS
5.0000 mg | ORAL_TABLET | Freq: Every day | ORAL | Status: DC
  Administered 2022-10-03 – 2022-10-05 (×3): 5 mg via ORAL
  Filled 2022-10-03 (×3): qty 1

## 2022-10-03 MED ORDER — METHOTREXATE 2.5 MG PO TABS: 15.0000 mg | ORAL_TABLET | ORAL | Status: DC

## 2022-10-03 MED ORDER — MIDODRINE HCL 5 MG PO TABS: 5.0000 mg | ORAL_TABLET | Freq: Three times a day (TID) | ORAL | Status: DC

## 2022-10-03 MED ORDER — ASPIRIN 81 MG PO TBEC
81.0000 mg | DELAYED_RELEASE_TABLET | Freq: Every day | ORAL | Status: DC
  Administered 2022-10-03 – 2022-10-05 (×3): 81 mg via ORAL
  Filled 2022-10-03 (×3): qty 1

## 2022-10-03 NOTE — Progress Notes (Signed)
OT Cancellation Note  Patient Details Name: [name] [name] MRN: [id number] DOB: 1933/10/31   Cancelled Treatment:    Reason Eval/Treat Not Completed: Fatigue/lethargy limiting ability to participate Patient in deep sleep in room. Nurse reporting that patient barely woke up for insulin shot earlier. OT to continue to follow and check back as schedule will allow.  Rennie Plowman, [place] [facility] Office# [phone] 10/03/2022, 2:04 PM

## 2022-10-03 NOTE — Assessment & Plan Note (Signed)
Nystatin swish and swallow

## 2022-10-03 NOTE — Progress Notes (Addendum)
[facility] PROGRESS NOTE   [name] [name] [id number] DOB: 22-Oct-1933 DOA: 10/02/2022  PCP: Charlane Ferretti, MD  Brief History/Interval Summary: 89 y.o. female with medical history significant of CKD, T2DM, GERD, HLD, HTN, hypothyroidism, OAB, chronic dysphagia, history of seizures, dementia who presented with AMS and lethargy.  Family also noticed her urine was cloudy at home sometime last week, but home test was negative for UTI. She also states she has pain with urinating. No CVA tenderness.  Her lethargy worsened.  She was brought into the emergency department.  She was diagnosed with UTI and was hospitalized for further management.    Consultants: None  Procedures: None    Subjective/Interval History: Patient somewhat distracted but does not appear to be in any discomfort.  Denies any pain.  No nausea or vomiting.     Assessment/Plan:  Urinary tract infection with sepsis present on admission Patient was noted to be confused, had leukocytosis and was tachycardic. UA was noted to be grossly abnormal.  Urine cultures are pending.  Patient was started on ceftriaxone.  IV fluids were given.  Hyponatremia Given IV fluids.  Repeat labs are pending from this morning.  Oral thrush Continue with nystatin.  Normocytic anemia Hemoglobin is stable.  No evidence of overt bleeding.  Seizure disorder Continue with Keppra.  Dose was recently increased to 750 mg twice a day however cut back to 500 mg twice a day by her PCP due to lethargy at home. Continue Keppra 500 mg twice a day.  Oropharyngeal dysphagia Continue with dysphagia 3 diet.  Orthostatic hypotension Lopressor was recently discontinued.  Started on midodrine.  Blood pressure is noted to be quite elevated.  Will decrease the dose of midodrine.  Diabetes mellitus type 2, insulin-dependent HbA1c 8.9 in October.  Just on SSI for now.  Monitor CBGs. At home she is supposed to be on Levemir and  NovoLog.  Hypothyroidism Continue levothyroxine. During previous hospitalization she was noted to have suppressed TSH with elevated free T4.  Dose of levothyroxine was decreased at that time.  She was on 200 mcg daily.  Cut down to 100 mcg daily at discharge.  TSH and free T4 levels will be checked.  History of dermatomyositis  Chronically on steroids and methotrexate.  Methotrexate currently on hold.  Continue with prednisone.  GERD Continue Pepcid.  History of dementia without behavioral disturbances Seems to be stable.  Delirium precautions.  Stage II decubitus coccyx, stage I pressure injury left heel Pressure Injury 10/02/22 Coccyx Medial Stage 2 -  Partial thickness loss of dermis presenting as a shallow open injury with a red, pink wound bed without slough. (Active)  10/02/22 2055  Location: Coccyx  Location Orientation: Medial  Staging: Stage 2 -  Partial thickness loss of dermis presenting as a shallow open injury with a red, pink wound bed without slough.  Wound Description (Comments):   Present on Admission: Yes     Pressure Injury 10/02/22 Heel Left;Right Stage 1 -  Intact skin with non-blanchable redness of a localized area usually over a bony prominence. Bilateral heel redness, prophylactic drsg applied, heels floating (Active)  10/02/22 2055  Location: Heel  Location Orientation: Left;Right  Staging: Stage 1 -  Intact skin with non-blanchable redness of a localized area usually over a bony prominence.  Wound Description (Comments): Bilateral heel redness, prophylactic drsg applied, heels floating  Present on Admission: Yes     Pressure Injury 10/02/22 Knee Anterior;Left Stage 2 -  Partial thickness loss  of dermis presenting as a shallow open injury with a red, pink wound bed without slough. 2 open areas above left knee (Active)  10/02/22 2055  Location: Knee  Location Orientation: Anterior;Left  Staging: Stage 2 -  Partial thickness loss of dermis presenting as a  shallow open injury with a red, pink wound bed without slough.  Wound Description (Comments): 2 open areas above left knee  Present on Admission: Yes     Pressure Injury 10/02/22 Toe (Comment  which one) Anterior;Right Stage 1 -  Intact skin with non-blanchable redness of a localized area usually over a bony prominence. right bunion redness (Active)  10/02/22 2055  Location: Toe (Comment  which one)  Location Orientation: Anterior;Right  Staging: Stage 1 -  Intact skin with non-blanchable redness of a localized area usually over a bony prominence.  Wound Description (Comments): right bunion redness  Present on Admission: Yes     Pressure Injury 10/02/22 Back Left;Upper Stage 1 -  Intact skin with non-blanchable redness of a localized area usually over a bony prominence. erythema to bony prominence of left shoulder blade (Active)  10/02/22 2055  Location: Back  Location Orientation: Left;Upper  Staging: Stage 1 -  Intact skin with non-blanchable redness of a localized area usually over a bony prominence.  Wound Description (Comments): erythema to bony prominence of left shoulder blade  Present on Admission: Yes    DVT Prophylaxis: Lovenox Code Status: DNR Family Communication: No family at bedside Disposition Plan: Hopefully return home when improved.  PT and OT evaluation.  Status is: Observation The patient will require care spanning > 2 midnights and should be moved to inpatient because: Confusion, UTI      Medications: Scheduled:  aspirin EC  81 mg Oral Daily   enoxaparin (LOVENOX) injection  40 mg Subcutaneous Q24H   famotidine  20 mg Oral QHS   folic acid  1 mg Oral QHS   insulin aspart  0-5 Units Subcutaneous QHS   insulin aspart  0-9 Units Subcutaneous TID WC   levETIRAcetam  500 mg Oral BID   levothyroxine  100 mcg Oral Q0600   loratadine  10 mg Oral QHS   midodrine  5 mg Oral TID WC   nystatin  5 mL Oral QID   predniSONE  5 mg Oral Q breakfast   Continuous:   cefTRIAXone (ROCEPHIN)  IV     [id number] **OR** acetaminophen, ondansetron **OR** ondansetron (ZOFRAN) IV, phenol  Antibiotics: Anti-infectives (From admission, onward)    Start     Dose/Rate Route Frequency Ordered Stop   10/03/22 1800  cefTRIAXone (ROCEPHIN) 1 g in sodium chloride 0.9 % 100 mL IVPB        1 g 200 mL/hr over 30 Minutes Intravenous Every 24 hours 10/02/22 1927     10/02/22 1830  cefTRIAXone (ROCEPHIN) 1 g in sodium chloride 0.9 % 100 mL IVPB        1 g 200 mL/hr over 30 Minutes Intravenous  Once 10/02/22 1828 10/02/22 1938       Objective:  Vital Signs  Vitals:   10/02/22 1833 10/02/22 2017 10/03/22 0214 10/03/22 0606  BP: (!) 146/72 (!) 166/68 (!) 174/75 (!) 159/80  Pulse: 70 72 68 67  Resp: 19 18 16 17   Temp: 98.5 F (36.9 C) 98.1 F (36.7 C) 98.3 F (36.8 C) 98.3 F (36.8 C)  TempSrc: Oral Oral Oral Oral  SpO2: 96% 95% 100% 100%  Weight:      Height:  Intake/Output Summary (Last 24 hours) at 10/03/2022 0919 Last data filed at 10/03/2022 0600 Gross per 24 hour  Intake 651.94 ml  Output 1300 ml  Net -648.06 ml   Filed Weights   10/02/22 1414  Weight: 45.4 kg    General appearance: Awake alert.  In no distress.  Pleasantly confused. Resp: Clear to auscultation bilaterally.  Normal effort Cardio: S1-S2 is normal regular.  No S3-S4.  No rubs murmurs or bruit GI: Abdomen is soft.  Nontender nondistended.  Bowel sounds are present normal.  No masses organomegaly Extremities: No edema.  Full range of motion of lower extremities. Neurologic:  No focal neurological deficits.    Lab Results:  Data Reviewed: I have personally reviewed following labs and reports of the imaging studies  CBC: Recent Labs  Lab 10/02/22 1625  WBC 18.6*  NEUTROABS 17.0*  HGB 10.0*  HCT 30.0*  MCV 97.1  PLT [id number]    Basic Metabolic Panel: Recent Labs  Lab 10/02/22 1625  NA 131*  K 4.6  CL 98  CO2 27  GLUCOSE 224*  BUN 15  CREATININE 0.72   CALCIUM 9.5    GFR: Estimated Creatinine Clearance: 36.2 mL/min (by C-G formula based on SCr of 0.72 mg/dL).  Liver Function Tests: Recent Labs  Lab 10/02/22 1625  AST 26  ALT 15  ALKPHOS 86  BILITOT 0.6  PROT 6.8  ALBUMIN 3.0*     CBG: Recent Labs  Lab 10/02/22 2211 10/03/22 0813  GLUCAP 228* 147*    Thyroid Function Tests: Recent Labs    10/02/22 1625  TSH 0.607     Recent Results (from the past 240 hour(s))  Resp panel by RT-PCR (RSV, Flu A&B, Covid) Anterior Nasal Swab     Status: None   Collection Time: 10/02/22  4:20 PM   Specimen: Anterior Nasal Swab  Result Value Ref Range Status   SARS Coronavirus 2 by RT PCR NEGATIVE NEGATIVE Final    Comment: (NOTE) SARS-CoV-2 target nucleic acids are NOT DETECTED.  The SARS-CoV-2 RNA is generally detectable in upper respiratory specimens during the acute phase of infection. The lowest concentration of SARS-CoV-2 viral copies this assay can detect is 138 copies/mL. A negative result does not preclude SARS-Cov-2 infection and should not be used as the sole basis for treatment or other patient management decisions. A negative result may occur with  improper specimen collection/handling, submission of specimen other than nasopharyngeal swab, presence of viral mutation(s) within the areas targeted by this assay, and inadequate number of viral copies(<138 copies/mL). A negative result must be combined with clinical observations, patient history, and epidemiological information. The expected result is Negative.  Fact Sheet for Patients:  EntrepreneurPulse.com.au  Fact Sheet for Healthcare Providers:  IncredibleEmployment.be  This test is no t yet approved or cleared by the [place] FDA and  has been authorized for detection and/or diagnosis of SARS-CoV-2 by FDA under an Emergency Use Authorization (EUA). This EUA will remain  in effect (meaning this test can be used) for  the duration of the COVID-19 declaration under Section 564(b)(1) of the Act, 21 U.S.C.section 360bbb-3(b)(1), unless the authorization is terminated  or revoked sooner.       Influenza A by PCR NEGATIVE NEGATIVE Final   Influenza B by PCR NEGATIVE NEGATIVE Final    Comment: (NOTE) The Xpert Xpress SARS-CoV-2/FLU/RSV plus assay is intended as an aid in the diagnosis of influenza from Nasopharyngeal swab specimens and should not be used as a sole basis for  treatment. Nasal washings and aspirates are unacceptable for Xpert Xpress SARS-CoV-2/FLU/RSV testing.  Fact Sheet for Patients: EntrepreneurPulse.com.au  Fact Sheet for Healthcare Providers: IncredibleEmployment.be  This test is not yet approved or cleared by the [place] FDA and has been authorized for detection and/or diagnosis of SARS-CoV-2 by FDA under an Emergency Use Authorization (EUA). This EUA will remain in effect (meaning this test can be used) for the duration of the COVID-19 declaration under Section 564(b)(1) of the Act, 21 U.S.C. section 360bbb-3(b)(1), unless the authorization is terminated or revoked.     Resp Syncytial Virus by PCR NEGATIVE NEGATIVE Final    Comment: (NOTE) Fact Sheet for Patients: EntrepreneurPulse.com.au  Fact Sheet for Healthcare Providers: IncredibleEmployment.be  This test is not yet approved or cleared by the [place] FDA and has been authorized for detection and/or diagnosis of SARS-CoV-2 by FDA under an Emergency Use Authorization (EUA). This EUA will remain in effect (meaning this test can be used) for the duration of the COVID-19 declaration under Section 564(b)(1) of the Act, 21 U.S.C. section 360bbb-3(b)(1), unless the authorization is terminated or revoked.  Performed at [facility], [place] [address] [place], [place] [address]       Radiology Studies: DG Chest Portable  1 View  Result Date: 10/02/2022 CLINICAL DATA:  Cough, fever, possible aspiration, chronic dysphagia EXAM: PORTABLE CHEST 1 VIEW COMPARISON:  09/10/2022 FINDINGS: The heart size and mediastinal contours are within normal limits. Left chest implantable loop recorder. Both lungs are clear. The visualized skeletal structures are unremarkable. IMPRESSION: No acute abnormality of the lungs in AP portable projection. Electronically Signed   By: Delanna Ahmadi M.D.   On: 10/02/2022 17:30       LOS: 0 days   [facility] Pager on [email or web address]  10/03/2022, 9:19 AM

## 2022-10-03 NOTE — Progress Notes (Signed)
PHARMACY - METHOTREXATE  Patient's home medication Methotrexate 15mg  po every Saturday ordered on admission.    Methotrexate (Trexall; Rheumatrex) hold criteria Hgb < 8 WBC < 3 Pltc < 100K SCr > 1.5x baseline (or > 2 if baseline unknown) AST or ALT >3x ULN Bili > 1.5x ULN Ascites or pleural effusion Diarrhea - Grade 2 or higher Ulcerative stomatitis Unexplained pneumonitis / hypoxemia Active infection  Patient admitted with sepsis due to urinary tract infection and currently on Ceftriaxone.  Methotrexate order has been d/c'ed per hold criteria.  Will follow for appropriate time to resume therapy.  Leone Haven, PharmD

## 2022-10-03 NOTE — Progress Notes (Signed)
Patient refused lab draw x2. Daughter Edwin Cap made aware, oncoming RN alerted.

## 2022-10-04 DIAGNOSIS — G40909 Epilepsy, unspecified, not intractable, without status epilepticus: Secondary | ICD-10-CM | POA: Diagnosis not present

## 2022-10-04 DIAGNOSIS — E876 Hypokalemia: Secondary | ICD-10-CM | POA: Diagnosis not present

## 2022-10-04 DIAGNOSIS — A419 Sepsis, unspecified organism: Secondary | ICD-10-CM | POA: Diagnosis not present

## 2022-10-04 DIAGNOSIS — B370 Candidal stomatitis: Secondary | ICD-10-CM

## 2022-10-04 LAB — CBC
HCT: 29.7 % — ABNORMAL LOW (ref 36.0–46.0)
Hemoglobin: 9.5 g/dL — ABNORMAL LOW (ref 12.0–15.0)
MCH: 32.1 pg (ref 26.0–34.0)
MCHC: 32 g/dL (ref 30.0–36.0)
MCV: 100.3 fL — ABNORMAL HIGH (ref 80.0–100.0)
Platelets: 344 10*3/uL (ref 150–400)
RBC: 2.96 MIL/uL — ABNORMAL LOW (ref 3.87–5.11)
RDW: 14.3 % (ref 11.5–15.5)
WBC: 10.4 10*3/uL (ref 4.0–10.5)
nRBC: 0 % (ref 0.0–0.2)

## 2022-10-04 LAB — BASIC METABOLIC PANEL
Anion gap: 12 (ref 5–15)
BUN: 11 mg/dL (ref 8–23)
CO2: 24 mmol/L (ref 22–32)
Calcium: 9.1 mg/dL (ref 8.9–10.3)
Chloride: 99 mmol/L (ref 98–111)
Creatinine, Ser: 0.61 mg/dL (ref 0.44–1.00)
GFR, Estimated: >60 mL/min (ref >60)
Glucose, Bld: 77 mg/dL (ref 70–99)
Potassium: 3.1 mmol/L — ABNORMAL LOW (ref 3.5–5.1)
Sodium: 135 mmol/L (ref 135–145)

## 2022-10-04 LAB — GLUCOSE, CAPILLARY
Glucose-Capillary: 141 mg/dL — ABNORMAL HIGH (ref 70–99)
Glucose-Capillary: 341 mg/dL — ABNORMAL HIGH (ref 70–99)
Glucose-Capillary: 455 mg/dL — ABNORMAL HIGH (ref 70–99)
Glucose-Capillary: 412 mg/dL — ABNORMAL HIGH (ref 70–99)

## 2022-10-04 LAB — MAGNESIUM: Magnesium: 1.6 mg/dL — ABNORMAL LOW (ref 1.7–2.4)

## 2022-10-04 LAB — T4, FREE: Free T4: 1.19 ng/dL — ABNORMAL HIGH (ref 0.61–1.12)

## 2022-10-04 MED ORDER — INSULIN ASPART 100 UNIT/ML IJ SOLN
1.0000 [IU] | Freq: Once | INTRAMUSCULAR | Status: AC
  Administered 2022-10-04: 1 [IU] via SUBCUTANEOUS

## 2022-10-04 MED ORDER — ORAL CARE MOUTH RINSE
15.0000 mL | OROMUCOSAL | Status: DC
  Administered 2022-10-04 – 2022-10-05 (×6): 15 mL via OROMUCOSAL

## 2022-10-04 MED ORDER — INSULIN ASPART 100 UNIT/ML IJ SOLN
15.0000 [IU] | Freq: Once | INTRAMUSCULAR | Status: AC
  Administered 2022-10-04: 15 [IU] via SUBCUTANEOUS

## 2022-10-04 MED ORDER — INSULIN ASPART 100 UNIT/ML IJ SOLN
0.0000 [IU] | Freq: Every day | INTRAMUSCULAR | Status: DC
  Administered 2022-10-04: 5 [IU] via SUBCUTANEOUS

## 2022-10-04 MED ORDER — INSULIN DETEMIR 100 UNIT/ML ~~LOC~~ SOLN
5.0000 [IU] | Freq: Two times a day (BID) | SUBCUTANEOUS | Status: DC
  Administered 2022-10-04 – 2022-10-05 (×2): 5 [IU] via SUBCUTANEOUS
  Filled 2022-10-04 (×3): qty 0.05

## 2022-10-04 MED ORDER — MAGNESIUM SULFATE 4 GM/100ML IV SOLN
4.0000 g | Freq: Once | INTRAVENOUS | Status: AC
  Administered 2022-10-04: 4 g via INTRAVENOUS
  Filled 2022-10-04: qty 100

## 2022-10-04 MED ORDER — ORAL CARE MOUTH RINSE: 15.0000 mL | OROMUCOSAL | Status: DC | PRN

## 2022-10-04 MED ORDER — INSULIN ASPART 100 UNIT/ML IJ SOLN: 0.0000 [IU] | Freq: Three times a day (TID) | INTRAMUSCULAR | Status: DC

## 2022-10-04 MED ORDER — FLUCONAZOLE 100 MG PO TABS
100.0000 mg | ORAL_TABLET | Freq: Every day | ORAL | Status: DC
  Administered 2022-10-04 – 2022-10-05 (×2): 100 mg via ORAL
  Filled 2022-10-04 (×2): qty 1

## 2022-10-04 MED ORDER — POTASSIUM CHLORIDE CRYS ER 20 MEQ PO TBCR
40.0000 meq | EXTENDED_RELEASE_TABLET | Freq: Three times a day (TID) | ORAL | Status: AC
  Administered 2022-10-04 (×2): 40 meq via ORAL
  Filled 2022-10-04 (×2): qty 2

## 2022-10-04 NOTE — Evaluation (Addendum)
Occupational Therapy Evaluation Patient Details Name: [name] [name] MRN: [id number] DOB: [birth year] Today's Date: 10/04/2022   History of Present Illness Patient is a 89 year old female who presented with lethargy and AMS. Patient was found to have UTI with sepsis, hyponatremia, oral thrush. [id number] anemia, seizure disorder, oropharyngeal dysphagia, orthostatic hypotension,   Clinical Impression   Patient evaluated by Occupational Therapy with no further acute OT needs identified. All education has been completed and the patient has no further questions. Daughter was present assisting patient with self feeding tasks. Patients daughter was educated on position strategies to avoid areas most likely to get build pressure and importance of gentle ROM of BUE and BLE daily. Patients daughter verbalized understanding. Patients daughter reported that they transitioned home from [facility] in [facility] with hospice but declined services after they came for the first session. Patient's daughter reported patient have 24/7 caregiver support and physical A at home and did not need therapy at this time.  See below for any follow-up Occupational Therapy or equipment needs. OT is signing off. Thank you for this referral.       Recommendations for follow up therapy are one component of a multi-disciplinary discharge planning process, led by the attending physician.  Recommendations may be updated based on patient status, additional functional criteria and insurance authorization.   Follow Up Recommendations  No OT follow up     Assistance Recommended at Discharge Frequent or constant Supervision/Assistance  Patient can return home with the following Assistance with feeding;Direct supervision/assist for medications management;Direct supervision/assist for financial management;Assist for transportation;Help with stairs or ramp for entrance;Assistance with cooking/housework;Two people to help with walking and/or  transfers;Two people to help with bathing/dressing/bathroom    Functional Status Assessment  Patient has had a recent decline in their functional status and demonstrates the ability to make significant improvements in function in a reasonable and predictable amount of time.  Equipment Recommendations  None recommended by OT (family has all needed items)       Precautions / Restrictions Precautions Precautions: Fall Restrictions Weight Bearing Restrictions: No      Mobility Bed Mobility               General bed mobility comments: patient was in chair position in bed with daughter reporting that patient does not typically get out of bed at home. then later in converstaion reported sometimes they help her sit up in living room           ADL either performed or assessed with clinical judgement   ADL Overall ADL's : Needs assistance/impaired Eating/Feeding: Maximal assistance Eating/Feeding Details (indicate cue type and reason): Patient has physical A for self feeding to slow down intake and ensure that pocketing is limited and addressed when noted. patient's daughter was present in room assisting patient with self feeding with appropraite bite sizes noted. patients daughter reported patient is at baseline at this time and did not need therapy services. Grooming: Bed level;Total assistance   Upper Body Bathing: Bed level;Total assistance   Lower Body Bathing: Bed level;Total assistance   Upper Body Dressing : Bed level;Total assistance   Lower Body Dressing: Bed level;Total assistance   Toilet Transfer: +2 for safety/equipment;+2 for physical assistance   Toileting- Clothing Manipulation and Hygiene: Bed level;Total assistance                Pertinent Vitals/Pain Pain Assessment Pain Assessment: Faces Faces Pain Scale: Hurts a little bit Pain Location: R big toe at rest (noted  to be red and have dark discoloration at top of large toe) Pain Descriptors /  Indicators: Grimacing (reporting my toe hurts) Pain Intervention(s): Monitored during session, Other (comment) (nurse made aware)     Hand Dominance Right   Extremity/Trunk Assessment             Communication     Cognition Arousal/Alertness: Awake/alert Behavior During Therapy: Flat affect Overall Cognitive Status: History of cognitive impairments - at baseline       General Comments: patient is oriented to self, patient attempted to participate in conversation but noted to be off topic at times. patients daughter present in room reported patient is at baseline. daughter able to provide [place] expects to be discharged to:: Private residence Living Arrangements: Children Available Help at Discharge: Family;Available 24 hours/day Type of Home: House Home Access: Stairs to enter [employer] of Steps: 2                       Additional Comments: Pt is never alone, as she stays with one of her daughter's a couple days out of the week & her other daughter comes to the pt's home and stays the other days.      Prior Functioning/Environment Prior Level of Function : Needs assist       Physical Assist : ADLs (physical);Mobility (physical) Mobility (physical): Gait;Transfers;Bed mobility ADLs (physical): Feeding;Grooming;Bathing;Dressing;IADLs;Toileting   ADLs Comments: patients daughter reported having nurse and other person come to bath patient during the week and having support for self feeding secondary to patient shoveling food into mouth and not having time to chew and swallow unable to follow commands. so family has taken over feeding.        OT Problem List:        OT Treatment/Interventions:      OT Goals(Current goals can be found in the care plan section) Acute Rehab OT Goals OT Goal Formulation: All assessment and education complete, DC therapy  OT Frequency:         AM-PAC OT "6 Clicks"  Daily Activity     Outcome Measure Help from another person eating meals?: Total Help from another person taking care of personal grooming?: Total Help from another person toileting, which includes using toliet, bedpan, or urinal?: Total Help from another person bathing (including washing, rinsing, drying)?: Total Help from another person to put on and taking off regular upper body clothing?: Total Help from another person to put on and taking off regular lower body clothing?: Total 6 Click Score: 6   End of Session Nurse Communication: Other (comment) (daughters concerns over big toe and therapy signing off)  Activity Tolerance: Patient tolerated treatment well Patient left: in bed;with call bell/phone within reach;with family/visitor present                   Time: [id number] OT Time Calculation (min): 9 min Charges:  OT General Charges $OT Visit: 1 Visit OT Evaluation $OT Eval Low Complexity: 1 Low  Remberto Lienhard OTR/L, MS Acute Rehabilitation Department Office# [phone]   Willa Rough 10/04/2022, 9:06 AM

## 2022-10-04 NOTE — Progress Notes (Signed)
PT Cancellation Note/ Screen  Patient Details Name: [name] [name] MRN: [id number] DOB: 11/26/1933   Cancelled Treatment:    Reason Eval/Treat Not Completed: PT screened, no needs identified, will sign off Please see OT note.  Family reports 24/7 caregiver and assist and declines therapy.  PT to sign off.   Myrtis Hopping [place] 10/04/2022, 9:10 AM Arlyce Dice, DPT Physical Therapist Acute Rehabilitation Services Preferred contact method: Secure Chat Weekend Pager Only: [phone] Office: [phone]

## 2022-10-04 NOTE — Progress Notes (Addendum)
[facility] PROGRESS NOTE   [name] [name] [id number] DOB: [birth year] DOA: 10/02/2022  PCP: Charlane Ferretti, MD  Brief History/Interval Summary: 89 y.o. female with medical history significant of CKD, T2DM, GERD, HLD, HTN, hypothyroidism, OAB, chronic dysphagia, history of seizures, dementia who presented with AMS and lethargy.  Family also noticed her urine was cloudy at home sometime last week, but home test was negative for UTI. She also states she has pain with urinating. No CVA tenderness.  Her lethargy worsened.  She was brought into the emergency department.  She was diagnosed with UTI and was hospitalized for further management.    Consultants: None  Procedures: None    Subjective/Interval History: Patient remains confused and distracted.  Her daughter is at the bedside.  No complaints offered.    Assessment/Plan:  Urinary tract infection with sepsis present on admission Patient was noted to be confused, had leukocytosis and was tachycardic. UA was noted to be grossly abnormal.  Urine cultures are pending.  Continue with ceftriaxone.  Encourage oral intake.  Okay to stop IV fluids.  Hyponatremia Improved with IV fluids.  Hypokalemia Will be repleted.  Check magnesium level.  Oral thrush Continue with nystatin.  Diflucan added for 5 days.  Normocytic anemia Hemoglobin is stable.  No evidence of overt bleeding.  Seizure disorder Continue with Keppra.  Dose was recently increased to 750 mg twice a day however cut back to 500 mg twice a day by her PCP due to lethargy at home. Continue Keppra 500 mg twice a day.  Oropharyngeal dysphagia Continue with dysphagia 3 diet.  Orthostatic hypotension Lopressor was recently discontinued.  Started on midodrine.  Blood pressure was noted to be quite elevated.  Midodrine dose has been decreased.  Continue to monitor for now.    Diabetes mellitus type 2, insulin-dependent HbA1c 8.9 in October.  Just on SSI for now.   Monitor CBGs. At home she is supposed to be on Levemir and NovoLog.  Hypothyroidism Continue levothyroxine. During previous hospitalization she was noted to have suppressed TSH with elevated free T4.  Dose of levothyroxine was decreased at that time.  She was on 200 mcg daily.  Cut down to 100 mcg daily at discharge. Repeat TSH is normal and free T4 is mildly elevated at 1.19 but better than 2.06 when checked during last admission. Will not change dose at this time. Recheck levels in 3 weeks.   History of dermatomyositis  Chronically on steroids and methotrexate.  Methotrexate currently on hold.  Continue with prednisone.  GERD Continue Pepcid.  History of dementia without behavioral disturbances Seems to be stable.  Delirium precautions.  Stage II decubitus coccyx, stage I pressure injury left heel Pressure Injury 10/02/22 Coccyx Medial Stage 2 -  Partial thickness loss of dermis presenting as a shallow open injury with a red, pink wound bed without slough. (Active)  10/02/22 2055  Location: Coccyx  Location Orientation: Medial  Staging: Stage 2 -  Partial thickness loss of dermis presenting as a shallow open injury with a red, pink wound bed without slough.  Wound Description (Comments):   Present on Admission: Yes     Pressure Injury 10/02/22 Heel Left;Right Stage 1 -  Intact skin with non-blanchable redness of a localized area usually over a bony prominence. Bilateral heel redness, prophylactic drsg applied, heels floating (Active)  10/02/22 2055  Location: Heel  Location Orientation: Left;Right  Staging: Stage 1 -  Intact skin with non-blanchable redness of a localized area usually  over a bony prominence.  Wound Description (Comments): Bilateral heel redness, prophylactic drsg applied, heels floating  Present on Admission: Yes     Pressure Injury 10/02/22 Knee Anterior;Left Stage 2 -  Partial thickness loss of dermis presenting as a shallow open injury with a red, pink wound  bed without slough. 2 open areas above left knee (Active)  10/02/22 2055  Location: Knee  Location Orientation: Anterior;Left  Staging: Stage 2 -  Partial thickness loss of dermis presenting as a shallow open injury with a red, pink wound bed without slough.  Wound Description (Comments): 2 open areas above left knee  Present on Admission: Yes     Pressure Injury 10/02/22 Toe (Comment  which one) Anterior;Right Stage 1 -  Intact skin with non-blanchable redness of a localized area usually over a bony prominence. right bunion redness (Active)  10/02/22 2055  Location: Toe (Comment  which one)  Location Orientation: Anterior;Right  Staging: Stage 1 -  Intact skin with non-blanchable redness of a localized area usually over a bony prominence.  Wound Description (Comments): right bunion redness  Present on Admission: Yes     Pressure Injury 10/02/22 Back Left;Upper Stage 1 -  Intact skin with non-blanchable redness of a localized area usually over a bony prominence. erythema to bony prominence of left shoulder blade (Active)  10/02/22 2055  Location: Back  Location Orientation: Left;Upper  Staging: Stage 1 -  Intact skin with non-blanchable redness of a localized area usually over a bony prominence.  Wound Description (Comments): erythema to bony prominence of left shoulder blade  Present on Admission: Yes    DVT Prophylaxis: Lovenox Code Status: DNR Family Communication: Discussed with daughter who was at the bedside. Disposition Plan: Hopefully return home when improved.  PT and OT evaluation.  Status is: Inpatient Remains inpatient appropriate because: Urinary tract infection, hyponatremia,      Medications: Scheduled:  aspirin EC  81 mg Oral Daily   enoxaparin (LOVENOX) injection  40 mg Subcutaneous Q24H   famotidine  20 mg Oral QHS   fluconazole  100 mg Oral Daily   folic acid  1 mg Oral QHS   insulin aspart  0-5 Units Subcutaneous QHS   insulin aspart  0-9 Units  Subcutaneous TID WC   levETIRAcetam  500 mg Oral BID   levothyroxine  100 mcg Oral Q0600   loratadine  10 mg Oral QHS   midodrine  2.5 mg Oral TID WC   nystatin  5 mL Oral QID   mouth rinse  15 mL Mouth Rinse 4 times per day   potassium chloride  40 mEq Oral TID   predniSONE  5 mg Oral Q breakfast   Continuous:  sodium chloride 75 mL/hr at 10/04/22 0617   cefTRIAXone (ROCEPHIN)  IV Stopped (10/03/22 1812)   [id number] **OR** acetaminophen, ondansetron **OR** ondansetron (ZOFRAN) IV, mouth rinse, phenol  Antibiotics: Anti-infectives (From admission, onward)    Start     Dose/Rate Route Frequency Ordered Stop   10/04/22 1000  fluconazole (DIFLUCAN) tablet 100 mg        100 mg Oral Daily 10/04/22 0846 10/09/22 0959   10/03/22 1800  cefTRIAXone (ROCEPHIN) 1 g in sodium chloride 0.9 % 100 mL IVPB        1 g 200 mL/hr over 30 Minutes Intravenous Every 24 hours 10/02/22 1927     10/02/22 1830  cefTRIAXone (ROCEPHIN) 1 g in sodium chloride 0.9 % 100 mL IVPB  1 g 200 mL/hr over 30 Minutes Intravenous  Once 10/02/22 1828 10/02/22 1938       Objective:  Vital Signs  Vitals:   10/03/22 1354 10/03/22 1646 10/03/22 2133 10/04/22 0441  BP: (!) 149/72 134/72 (!) 166/68 (!) 162/85  Pulse: 96 89 75 73  Resp: (!) 21 18 16    Temp:  98.5 F (36.9 C) 98.3 F (36.8 C) 97.9 F (36.6 C)  TempSrc:  Oral Oral Oral  SpO2: 100% 98% 100% 99%  Weight:      Height:        Intake/Output Summary (Last 24 hours) at 10/04/2022 0930 Last data filed at 10/04/2022 0736 Gross per 24 hour  Intake 1126.61 ml  Output 1700 ml  Net -573.39 ml    Filed Weights   10/02/22 1414  Weight: 45.4 kg    General appearance: Awake alert.  In no distress.  Distracted. Resp: Clear to auscultation bilaterally.  Normal effort Cardio: S1-S2 is normal regular.  No S3-S4.  No rubs murmurs or bruit GI: Abdomen is soft.  Nontender nondistended.  Bowel sounds are present normal.  No masses  organomegaly Extremities: No edema.    Lab Results:  Data Reviewed: I have personally reviewed following labs and reports of the imaging studies  CBC: Recent Labs  Lab 10/02/22 1625 10/03/22 1209 10/04/22 0431  WBC 18.6* 14.0* 10.4  NEUTROABS 17.0*  --   --   HGB 10.0* 9.4* 9.5*  HCT 30.0* 28.1* 29.7*  MCV 97.1 97.6 100.3*  PLT 383 348 344     Basic Metabolic Panel: Recent Labs  Lab 10/02/22 1625 10/03/22 1209 10/04/22 0431  NA 131* 129* 135  K 4.6 4.0 3.1*  CL 98 97* 99  CO2 27 24 24   GLUCOSE 224* 312* 77  BUN 15 13 11   CREATININE 0.72 0.71 0.61  CALCIUM 9.5 9.1 9.1     GFR: Estimated Creatinine Clearance: 36.2 mL/min (by C-G formula based on SCr of 0.61 mg/dL).  Liver Function Tests: Recent Labs  Lab 10/02/22 1625  AST 26  ALT 15  ALKPHOS 86  BILITOT 0.6  PROT 6.8  ALBUMIN 3.0*      CBG: Recent Labs  Lab 10/03/22 1112 10/03/22 1643 10/03/22 2017 10/03/22 2245 10/04/22 0733  GLUCAP 235* 296* 291* 204* 141*     Thyroid Function Tests: Recent Labs    10/02/22 1625  TSH 0.607      Recent Results (from the past 240 hour(s))  Urine Culture     Status: None (Preliminary result)   Collection Time: 10/02/22  3:30 PM   Specimen: Urine, Clean Catch  Result Value Ref Range Status   Specimen Description   Final    URINE, CLEAN CATCH Performed at [facility], [place] [address], [place], [place] [address]    Special Requests   Final    NONE Performed at [facility], [place] [address] [place], [place] [address]    Culture   Final    CULTURE REINCUBATED FOR BETTER GROWTH Performed at [facility], [place] [address] [place],  [address]    Report Status PENDING  Incomplete  Resp panel by RT-PCR (RSV, Flu A&B, Covid) Anterior Nasal Swab     Status: None   Collection Time: 10/02/22  4:20 PM   Specimen: Anterior Nasal Swab  Result Value Ref Range Status   SARS Coronavirus 2 by RT PCR NEGATIVE  NEGATIVE Final    Comment: (NOTE) SARS-CoV-2 target  nucleic acids are NOT DETECTED.  The SARS-CoV-2 RNA is generally detectable in upper respiratory specimens during the acute phase of infection. The lowest concentration of SARS-CoV-2 viral copies this assay can detect is 138 copies/mL. A negative result does not preclude SARS-Cov-2 infection and should not be used as the sole basis for treatment or other patient management decisions. A negative result may occur with  improper specimen collection/handling, submission of specimen other than nasopharyngeal swab, presence of viral mutation(s) within the areas targeted by this assay, and inadequate number of viral copies(<138 copies/mL). A negative result must be combined with clinical observations, patient history, and epidemiological information. The expected result is Negative.  Fact Sheet for Patients:  EntrepreneurPulse.com.au  Fact Sheet for Healthcare Providers:  IncredibleEmployment.be  This test is no t yet approved or cleared by the [place] FDA and  has been authorized for detection and/or diagnosis of SARS-CoV-2 by FDA under an Emergency Use Authorization (EUA). This EUA will remain  in effect (meaning this test can be used) for the duration of the COVID-19 declaration under Section 564(b)(1) of the Act, 21 U.S.C.section 360bbb-3(b)(1), unless the authorization is terminated  or revoked sooner.       Influenza A by PCR NEGATIVE NEGATIVE Final   Influenza B by PCR NEGATIVE NEGATIVE Final    Comment: (NOTE) The Xpert Xpress SARS-CoV-2/FLU/RSV plus assay is intended as an aid in the diagnosis of influenza from Nasopharyngeal swab specimens and should not be used as a sole basis for treatment. Nasal washings and aspirates are unacceptable for Xpert Xpress SARS-CoV-2/FLU/RSV testing.  Fact Sheet for Patients: EntrepreneurPulse.com.au  Fact Sheet for Healthcare  Providers: IncredibleEmployment.be  This test is not yet approved or cleared by the [place] FDA and has been authorized for detection and/or diagnosis of SARS-CoV-2 by FDA under an Emergency Use Authorization (EUA). This EUA will remain in effect (meaning this test can be used) for the duration of the COVID-19 declaration under Section 564(b)(1) of the Act, 21 U.S.C. section 360bbb-3(b)(1), unless the authorization is terminated or revoked.     Resp Syncytial Virus by PCR NEGATIVE NEGATIVE Final    Comment: (NOTE) Fact Sheet for Patients: EntrepreneurPulse.com.au  Fact Sheet for Healthcare Providers: IncredibleEmployment.be  This test is not yet approved or cleared by the [place] FDA and has been authorized for detection and/or diagnosis of SARS-CoV-2 by FDA under an Emergency Use Authorization (EUA). This EUA will remain in effect (meaning this test can be used) for the duration of the COVID-19 declaration under Section 564(b)(1) of the Act, 21 U.S.C. section 360bbb-3(b)(1), unless the authorization is terminated or revoked.  Performed at [facility], [place] [address] [place], [place] [address]   Blood culture (routine x 2)     Status: None (Preliminary result)   Collection Time: 10/02/22  4:25 PM   Specimen: BLOOD  Result Value Ref Range Status   Specimen Description   Final    BLOOD SITE NOT SPECIFIED Performed at [facility] [address] [place], [place] [address]    Special Requests   Final    BOTTLES DRAWN AEROBIC AND ANAEROBIC Blood Culture results may not be optimal due to an inadequate volume of blood received in culture bottles Performed at [facility] [address] [place], [place] [address]    Culture   Final    NO GROWTH 2 DAYS Performed at [facility] [address] [place],  [address]  Report Status PENDING   Incomplete  Blood culture (routine x 2)     Status: None (Preliminary result)   Collection Time: 10/02/22  4:45 PM   Specimen: BLOOD  Result Value Ref Range Status   Specimen Description   Final    BLOOD RIGHT ANTECUBITAL Performed at [facility] [address] [place], [place] [address]    Special Requests   Final    BOTTLES DRAWN AEROBIC AND ANAEROBIC Blood Culture adequate volume Performed at [facility] [address], [place], [place] [address]    Culture   Final    NO GROWTH 2 DAYS Performed at [facility] [address] [place], [place] [address]    Report Status PENDING  Incomplete      Radiology Studies: DG Chest Portable 1 View  Result Date: 10/02/2022 CLINICAL DATA:  Cough, fever, possible aspiration, chronic dysphagia EXAM: PORTABLE CHEST 1 VIEW COMPARISON:  09/10/2022 FINDINGS: The heart size and mediastinal contours are within normal limits. Left chest implantable loop recorder. Both lungs are clear. The visualized skeletal structures are unremarkable. IMPRESSION: No acute abnormality of the lungs in AP portable projection. Electronically Signed   By: Delanna Ahmadi M.D.   On: 10/02/2022 17:30       LOS: 1 day   [facility] Pager on [email or web address]  10/04/2022, 9:30 AM

## 2022-10-04 NOTE — TOC Initial Note (Signed)
Transition of Care [facility]) - Initial/Assessment Note    Patient Details  Name: [name] [name] MRN: [id number] Date of Birth: 1933/12/25  [facility] CM/SW Contact:    [name] Dine, RN Phone Number: 10/04/2022, 11:07 AM  Clinical Narrative:                 [facility] consult for d/c planning; pt oriented x 1 to self; called pt's dtr/POC [name] [name] [phone]); she says pt is from home and she plans for pt to return at d/c; she denies pt experiencing IPV, food insecurity, and difficulty paying utilities; she says pt has transportation; her dtr says pt has glass, bilateral HA, and dentures (upper/lower); she also says pt has cane, walker, wheel chair, and BSC; pt's dtr says pt has bars in shower; pt  has HHPT/RN/Aide w' [facility], and her dtr plans to con't services w/ agency at d/c; she also says pt does not have home oxygen; TOC will follow.  Expected Discharge Plan: [place] Barriers to Discharge: Continued Medical Work up   Patient Goals and CMS Choice Patient states their goals for this hospitalization and ongoing recovery are:: home per pt's dtr [name] [name]          Expected Discharge Plan and Services   Discharge Planning Services: CM Consult Post Acute Care Choice: Resumption of Svcs/PTA Provider (HHPT/RN/Aide w/ [facility]) Living arrangements for the past 2 months: Single Family Home                                      Prior Living Arrangements/Services Living arrangements for the past 2 months: Single Family Home Lives with:: Self Patient language and need for interpreter reviewed:: Yes Do you feel safe going back to the place where you live?: Yes      Need for Family Participation in Patient Care: Yes (Comment) Care giver support system in place?: Yes (comment) Current home services: DME, Homehealth aide, Home PT, Home RN (cane, walker, wheelchair,BSC; [place] services w/ [facility]) Criminal Activity/Legal  Involvement Pertinent to Current Situation/Hospitalization: No - Comment as needed  Activities of Daily Living Home Assistive Devices/Equipment: CBG Meter, Hearing aid, Walker (specify type), Wheelchair, Bedside commode/3-in-1, Transfer belt, Blood pressure cuff, Shower chair with back, Built-in shower seat, Tub transfer bench, Dentures (specify type) ADL Screening (condition at time of admission) Patient's cognitive ability adequate to safely complete daily activities?: No Is the patient deaf or have difficulty hearing?: No Does the patient have difficulty seeing, even when wearing glasses/contacts?: No Does the patient have difficulty concentrating, remembering, or making decisions?: Yes Patient able to express need for assistance with ADLs?: No Does the patient have difficulty dressing or bathing?: Yes Independently performs ADLs?: No Communication: Dependent Is this a change from baseline?: Pre-admission baseline Dressing (OT): Dependent Is this a change from baseline?: Pre-admission baseline Grooming: Dependent Is this a change from baseline?: Pre-admission baseline Feeding: Dependent Is this a change from baseline?: Pre-admission baseline Bathing: Dependent Is this a change from baseline?: Pre-admission baseline Toileting: Dependent Is this a change from baseline?: Pre-admission baseline In/Out Bed: Dependent Is this a change from baseline?: Pre-admission baseline Walks in Home: Dependent Is this a change from baseline?: Pre-admission baseline Does the patient have difficulty walking or climbing stairs?: Yes Weakness of Legs: Both Weakness of Arms/Hands: Both  Permission Sought/Granted Permission sought to share information with : Case Manager Permission granted to  share information with : Yes, Verbal Permission Granted  Share Information with NAME: [name] Coffin, RN, CM     Permission granted to share info w Relationship: [name] [name] (dtr) [phone]      Emotional Assessment Appearance:: Other (Comment Required (unable to assess) Attitude/Demeanor/Rapport: Unable to Assess Affect (typically observed): Unable to Assess Orientation: : Oriented to Self Alcohol / Substance Use: Not Applicable Psych Involvement: No (comment)  Admission diagnosis:  Acute cystitis without hematuria [N30.00] Foul smelling urine [R82.90] Sepsis due to urinary tract infection ([place]) [A41.9, N39.0] Sepsis, due to unspecified organism, unspecified whether acute organ dysfunction present ([place]) [A41.9] Acute cough [R05.1] UTI (urinary tract infection) [N39.0] Patient Active Problem List   Diagnosis Date Noted   UTI (urinary tract infection) 10/03/2022   Sepsis due to urinary tract infection ([place]) 10/02/2022   Normocytic anemia 07/27/2022   Dementia without behavioral disturbance (HCC) 07/27/2022   Malnutrition of moderate degree 05/11/2022   Syncope and collapse 09/13/2021   Dermatopolymyositis, unspecified, organ involvement unspecified ([place]) 09/13/2021   Poor appetite 09/13/2021   Hyponatremia 09/13/2021   Uncontrolled diabetes mellitus with hypoglycemia without coma, with long-term current use of insulin ([place]) 09/13/2021   Sacral ulcer ([place]) 09/13/2021   Oropharyngeal dysphagia 05/15/2021   Allergic conjunctivitis of both eyes 03/15/2020   PAD (peripheral artery disease) ([place]) 10/24/2019   Malnutrition ([place]) 10/10/2019   History of seizure [id number]   Acute metabolic encephalopathy [id number]   Right shoulder pain 08/21/2019   Leukocytosis 08/21/2019   Acute encephalopathy 08/18/2019   Enterocolitis 08/18/2019   Hyperglycemia 08/18/2019   Chronic kidney disease    Pressure injury of skin 12/09/2016   SBO (small bowel obstruction) s/p lysis of adhesions 12/03/2016 12/04/2016   Acute urinary retention 11/27/2016   Ascites 11/27/2016   Abdominal distension    Facial droop    Hyperkalemia 11/25/2016   Sepsis ([place]) 11/25/2016   Acute renal failure  superimposed on stage 2 chronic kidney disease ([place]) 11/25/2016   Delirium 11/25/2016   Age-related osteoporosis without current pathological fracture 10/01/2016   GERD (gastroesophageal reflux disease) 06/10/2016   Insomnia 06/10/2016   Viral gastroenteritis    Right hip pain 05/06/2015   Vaginal atrophy 11/01/2013   ECZEMA 07/24/2008   INTERMITTENT VERTIGO 07/24/2008   Constipation 11/16/2007   INSOMNIA 08/12/2007   Oral thrush 08/03/2007   Hypertension with hx of orthostatic hypotension 02/14/2007   Allergic rhinitis due to allergen 02/14/2007   Hypothyroidism, acquired 01/26/2007   Insulin dependent type 2 diabetes mellitus ([place]) 01/26/2007   GERD 01/26/2007   DEGENERATIVE JOINT DISEASE 01/26/2007   PCP:  Charlane Ferretti, MD Pharmacy:   [facility] [place], [place] AT [place] [address] Phone: [phone] Fax: [phone]  [facility] [address] [address] Phone: [phone] Fax: [phone]     Social Determinants of Health (SDOH) Social History: SDOH Screenings   Food Insecurity: No Food Insecurity (10/02/2022)  Housing: Low Risk  (10/02/2022)  Transportation Needs: No Transportation Needs (10/02/2022)  Utilities: Not At Risk (10/02/2022)  Tobacco Use: Medium Risk (10/02/2022)   SDOH Interventions:     Readmission Risk Interventions    01/19/2022   11:11 AM  Readmission Risk Prevention Plan  Transportation Screening Complete  PCP or Specialist Appt within 3-5 Days Complete  HRI or [place] Complete  Social Work Consult for [place] Planning/Counseling Complete  Palliative Care Screening Not Applicable  Medication Review (RN Care Manager) Complete

## 2022-10-04 NOTE — Progress Notes (Signed)
Pt HS CBG 412.  Spoke with coverage NP j. Olena Heckle.  Orders given to give novolog 5 units per sliding scale and add 1 additional unit for coverage tonight.  Will spot check CBG later this shift to make sure she doesn't drop CBG overnight.  All orders placed.

## 2022-10-05 DIAGNOSIS — A419 Sepsis, unspecified organism: Secondary | ICD-10-CM | POA: Diagnosis not present

## 2022-10-05 DIAGNOSIS — N390 Urinary tract infection, site not specified: Secondary | ICD-10-CM | POA: Diagnosis not present

## 2022-10-05 LAB — URINE CULTURE: Culture: >=100000 — AB

## 2022-10-05 LAB — CBC
HCT: 29.4 % — ABNORMAL LOW (ref 36.0–46.0)
Hemoglobin: 9.6 g/dL — ABNORMAL LOW (ref 12.0–15.0)
MCH: 31.8 pg (ref 26.0–34.0)
MCHC: 32.7 g/dL (ref 30.0–36.0)
MCV: 97.4 fL (ref 80.0–100.0)
Platelets: 380 10*3/uL (ref 150–400)
RBC: 3.02 MIL/uL — ABNORMAL LOW (ref 3.87–5.11)
RDW: 14.3 % (ref 11.5–15.5)
WBC: 9.4 10*3/uL (ref 4.0–10.5)
nRBC: 0 % (ref 0.0–0.2)

## 2022-10-05 LAB — BASIC METABOLIC PANEL
Anion gap: 9 (ref 5–15)
BUN: 13 mg/dL (ref 8–23)
CO2: 23 mmol/L (ref 22–32)
Calcium: 9.2 mg/dL (ref 8.9–10.3)
Chloride: 102 mmol/L (ref 98–111)
Creatinine, Ser: 0.61 mg/dL (ref 0.44–1.00)
GFR, Estimated: >60 mL/min (ref >60)
Glucose, Bld: 96 mg/dL (ref 70–99)
Potassium: 4 mmol/L (ref 3.5–5.1)
Sodium: 134 mmol/L — ABNORMAL LOW (ref 135–145)

## 2022-10-05 LAB — HEMOGLOBIN A1C
Hgb A1c MFr Bld: 8.6 % — ABNORMAL HIGH (ref 4.8–5.6)
Mean Plasma Glucose: 200 mg/dL

## 2022-10-05 LAB — GLUCOSE, CAPILLARY
Glucose-Capillary: 103 mg/dL — ABNORMAL HIGH (ref 70–99)
Glucose-Capillary: 115 mg/dL — ABNORMAL HIGH (ref 70–99)
Glucose-Capillary: 169 mg/dL — ABNORMAL HIGH (ref 70–99)
Glucose-Capillary: 97 mg/dL (ref 70–99)

## 2022-10-05 MED ORDER — LEVETIRACETAM 100 MG/ML PO SOLN: 500.0000 mg | Freq: Two times a day (BID) | ORAL | Status: DC

## 2022-10-05 MED ORDER — AMOXICILLIN 250 MG/5ML PO SUSR
500.0000 mg | Freq: Three times a day (TID) | ORAL | Status: DC
  Administered 2022-10-05: 500 mg via ORAL
  Filled 2022-10-05: qty 10

## 2022-10-05 MED ORDER — AMOXICILLIN 250 MG PO CAPS: 500.0000 mg | ORAL_CAPSULE | Freq: Three times a day (TID) | ORAL | Status: DC

## 2022-10-05 MED ORDER — AMOXICILLIN 250 MG/5ML PO SUSR: 500.0000 mg | Freq: Three times a day (TID) | ORAL | Status: DC

## 2022-10-05 MED ORDER — AMOXICILLIN 250 MG/5ML PO SUSR: 500.0000 mg | Freq: Three times a day (TID) | ORAL | 0 refills | Status: AC

## 2022-10-05 MED ORDER — SACCHAROMYCES BOULARDII 250 MG PO CAPS
250.0000 mg | ORAL_CAPSULE | Freq: Two times a day (BID) | ORAL | Status: DC
  Administered 2022-10-05: 250 mg via ORAL
  Filled 2022-10-05: qty 1

## 2022-10-05 MED ORDER — MIDODRINE HCL 5 MG PO TABS: 2.5000 mg | ORAL_TABLET | Freq: Three times a day (TID) | ORAL | 2 refills | Status: DC

## 2022-10-05 MED ORDER — NYSTATIN 100000 UNIT/ML MT SUSP: 5.0000 mL | Freq: Four times a day (QID) | OROMUCOSAL | 0 refills | Status: DC

## 2022-10-05 MED ORDER — FLUCONAZOLE 100 MG PO TABS: 100.0000 mg | ORAL_TABLET | Freq: Every day | ORAL | 0 refills | Status: AC

## 2022-10-05 NOTE — Discharge Summary (Signed)
[facility]  Physician Discharge Summary   Patient ID: [name] [name] MRN: [id number] DOB/AGE: February 03, 1934 89 y.o.  Admit date: 10/02/2022 Discharge date:   10/05/2022   PCP: Charlane Ferretti, MD  DISCHARGE DIAGNOSES:    Sepsis due to urinary tract infection ([place])   Hyponatremia   Oral thrush   Normocytic anemia   History of seizure   Oropharyngeal dysphagia   Insulin dependent type 2 diabetes mellitus ([place])   Hypertension with hx of orthostatic hypotension   Hypothyroidism, acquired   Dermatopolymyositis, unspecified, organ involvement unspecified ([place])   GERD   Dementia without behavioral disturbance ([place])   UTI (urinary tract infection)   RECOMMENDATIONS FOR OUTPATIENT FOLLOW UP: Methotrexate can be resumed from Saturday, March 23.   Recheck TSH and free T4 in 3 weeks   Home Health: None Equipment/Devices: None  CODE STATUS: DNR  DISCHARGE CONDITION: fair  Diet recommendation: As before  INITIAL HISTORY: 89 y.o. female with medical history significant of CKD, T2DM, GERD, HLD, HTN, hypothyroidism, OAB, chronic dysphagia, history of seizures, dementia who presented with AMS and lethargy.  Family also noticed her urine was cloudy at home sometime last week, but home test was negative for UTI. She also states she has pain with urinating. No CVA tenderness.  Her lethargy worsened.  She was brought into the emergency department.  She was diagnosed with UTI and was hospitalized for further management.     HOSPITAL COURSE:   Urinary tract infection with sepsis present on admission Patient was noted to be confused, had leukocytosis and was tachycardic. UA was noted to be grossly abnormal.  Initially treated with ceftriaxone.  Urine culture positive for Enterococcus faecalis.  Sensitivities reviewed.  She will be discharged on amoxicillin.     Hyponatremia Improved with IV fluids.   Hypokalemia Repleted   Oral thrush Continue with nystatin.  Diflucan added for  5 days.   Normocytic anemia Hemoglobin is stable.  No evidence of overt bleeding.   Seizure disorder Continue with Keppra.  Dose was recently increased to 750 mg twice a day however cut back to 500 mg twice a day by her PCP due to lethargy at home. Continue Keppra 500 mg twice a day.   Oropharyngeal dysphagia Continue with dysphagia 3 diet.   Orthostatic hypotension Lopressor was recently discontinued.  Started on midodrine.  Blood pressure was noted to be quite elevated.  Midodrine dose has been decreased.    Diabetes mellitus type 2, insulin-dependent Continue home medications   Hypothyroidism Continue levothyroxine. During previous hospitalization she was noted to have suppressed TSH with elevated free T4.  Dose of levothyroxine was decreased at that time.  She was on 200 mcg daily.  Cut down to 100 mcg daily at discharge. Repeat TSH is normal and free T4 is mildly elevated at 1.19 but better than 2.06 when checked during last admission. Will not change dose at this time. Recheck levels in 3 weeks.    History of dermatomyositis  Chronically on steroids and methotrexate.  Methotrexate was kept on hold.  Can resume from Saturday.   GERD Continue Pepcid.   History of dementia without behavioral disturbances Seems to be stable.  Delirium precautions.   Stage II decubitus coccyx, stage I pressure injury left heel Pressure Injury 10/02/22 Coccyx Medial Stage 2 -  Partial thickness loss of dermis presenting as a shallow open injury with a red, pink wound bed without slough. (Active)  10/02/22 2055  Location: Coccyx  Location Orientation: Medial  Staging: Stage 2 -  Partial thickness loss of dermis presenting as a shallow open injury with a red, pink wound bed without slough.  Wound Description (Comments):   Present on Admission: Yes     Pressure Injury 10/02/22 Heel Left;Right Stage 1 -  Intact skin with non-blanchable redness of a localized area usually over a bony prominence.  Bilateral heel redness, prophylactic drsg applied, heels floating (Active)  10/02/22 2055  Location: Heel  Location Orientation: Left;Right  Staging: Stage 1 -  Intact skin with non-blanchable redness of a localized area usually over a bony prominence.  Wound Description (Comments): Bilateral heel redness, prophylactic drsg applied, heels floating  Present on Admission: Yes     Pressure Injury 10/02/22 Knee Anterior;Left Stage 2 -  Partial thickness loss of dermis presenting as a shallow open injury with a red, pink wound bed without slough. 2 open areas above left knee (Active)  10/02/22 2055  Location: Knee  Location Orientation: Anterior;Left  Staging: Stage 2 -  Partial thickness loss of dermis presenting as a shallow open injury with a red, pink wound bed without slough.  Wound Description (Comments): 2 open areas above left knee  Present on Admission: Yes     Pressure Injury 10/02/22 Toe (Comment  which one) Anterior;Right Stage 1 -  Intact skin with non-blanchable redness of a localized area usually over a bony prominence. right bunion redness (Active)  10/02/22 2055  Location: Toe (Comment  which one)  Location Orientation: Anterior;Right  Staging: Stage 1 -  Intact skin with non-blanchable redness of a localized area usually over a bony prominence.  Wound Description (Comments): right bunion redness  Present on Admission: Yes     Pressure Injury 10/02/22 Back Left;Upper Stage 1 -  Intact skin with non-blanchable redness of a localized area usually over a bony prominence. erythema to bony prominence of left shoulder blade (Active)  10/02/22 2055  Location: Back  Location Orientation: Left;Upper  Staging: Stage 1 -  Intact skin with non-blanchable redness of a localized area usually over a bony prominence.  Wound Description (Comments): erythema to bony prominence of left shoulder blade  Present on Admission: Yes     Patient is stable.  Okay for discharge home today.   Discussed with her daughter.   PERTINENT LABS:  The results of significant diagnostics from this hospitalization (including imaging, microbiology, ancillary and laboratory) are listed below for reference.    Microbiology: Recent Results (from the past 240 hour(s))  Urine Culture     Status: Abnormal   Collection Time: 10/02/22  3:30 PM   Specimen: Urine, Clean Catch  Result Value Ref Range Status   Specimen Description   Final    URINE, CLEAN CATCH Performed at [facility], [place] [address], [place], [place] [address]    Special Requests   Final    NONE Performed at [facility], [place] [address] [place], [place] [address]    Culture >=100,000 COLONIES/mL ENTEROCOCCUS FAECALIS (A)  Final   Report Status 10/05/2022 FINAL  Final   Organism ID, Bacteria ENTEROCOCCUS FAECALIS (A)  Final      Susceptibility   Enterococcus faecalis - MIC*    AMPICILLIN <=2 SENSITIVE Sensitive     NITROFURANTOIN <=16 SENSITIVE Sensitive     VANCOMYCIN 1 SENSITIVE Sensitive     LINEZOLID 2 SENSITIVE Sensitive     * >=100,000 COLONIES/mL ENTEROCOCCUS FAECALIS  Resp panel by RT-PCR (RSV, Flu A&B, Covid) Anterior Nasal Swab  Status: None   Collection Time: 10/02/22  4:20 PM   Specimen: Anterior Nasal Swab  Result Value Ref Range Status   SARS Coronavirus 2 by RT PCR NEGATIVE NEGATIVE Final    Comment: (NOTE) SARS-CoV-2 target nucleic acids are NOT DETECTED.  The SARS-CoV-2 RNA is generally detectable in upper respiratory specimens during the acute phase of infection. The lowest concentration of SARS-CoV-2 viral copies this assay can detect is 138 copies/mL. A negative result does not preclude SARS-Cov-2 infection and should not be used as the sole basis for treatment or other patient management decisions. A negative result may occur with  improper specimen collection/handling, submission of specimen other than nasopharyngeal swab, presence of viral mutation(s)  within the areas targeted by this assay, and inadequate number of viral copies(<138 copies/mL). A negative result must be combined with clinical observations, patient history, and epidemiological information. The expected result is Negative.  Fact Sheet for Patients:  EntrepreneurPulse.com.au  Fact Sheet for Healthcare Providers:  IncredibleEmployment.be  This test is no t yet approved or cleared by the [place] FDA and  has been authorized for detection and/or diagnosis of SARS-CoV-2 by FDA under an Emergency Use Authorization (EUA). This EUA will remain  in effect (meaning this test can be used) for the duration of the COVID-19 declaration under Section 564(b)(1) of the Act, 21 U.S.C.section 360bbb-3(b)(1), unless the authorization is terminated  or revoked sooner.       Influenza A by PCR NEGATIVE NEGATIVE Final   Influenza B by PCR NEGATIVE NEGATIVE Final    Comment: (NOTE) The Xpert Xpress SARS-CoV-2/FLU/RSV plus assay is intended as an aid in the diagnosis of influenza from Nasopharyngeal swab specimens and should not be used as a sole basis for treatment. Nasal washings and aspirates are unacceptable for Xpert Xpress SARS-CoV-2/FLU/RSV testing.  Fact Sheet for Patients: EntrepreneurPulse.com.au  Fact Sheet for Healthcare Providers: IncredibleEmployment.be  This test is not yet approved or cleared by the [place] FDA and has been authorized for detection and/or diagnosis of SARS-CoV-2 by FDA under an Emergency Use Authorization (EUA). This EUA will remain in effect (meaning this test can be used) for the duration of the COVID-19 declaration under Section 564(b)(1) of the Act, 21 U.S.C. section 360bbb-3(b)(1), unless the authorization is terminated or revoked.     Resp Syncytial Virus by PCR NEGATIVE NEGATIVE Final    Comment: (NOTE) Fact Sheet for  Patients: EntrepreneurPulse.com.au  Fact Sheet for Healthcare Providers: IncredibleEmployment.be  This test is not yet approved or cleared by the [place] FDA and has been authorized for detection and/or diagnosis of SARS-CoV-2 by FDA under an Emergency Use Authorization (EUA). This EUA will remain in effect (meaning this test can be used) for the duration of the COVID-19 declaration under Section 564(b)(1) of the Act, 21 U.S.C. section 360bbb-3(b)(1), unless the authorization is terminated or revoked.  Performed at [facility], [place] [address] [place], [place] [address]   Blood culture (routine x 2)     Status: None (Preliminary result)   Collection Time: 10/02/22  4:25 PM   Specimen: BLOOD  Result Value Ref Range Status   Specimen Description   Final    BLOOD SITE NOT SPECIFIED Performed at [place] [address] [place], [place] [address]    Special Requests   Final    BOTTLES DRAWN AEROBIC AND ANAEROBIC Blood Culture results may not be optimal due to an inadequate volume of blood received in culture bottles Performed at  [facility], [place] [address], [place], [place] [address]    Culture   Final    NO GROWTH 3 DAYS Performed at [facility], [place] [address], [place], [place] [address]    Report Status PENDING  Incomplete  Blood culture (routine x 2)     Status: None (Preliminary result)   Collection Time: 10/02/22  4:45 PM   Specimen: BLOOD  Result Value Ref Range Status   Specimen Description   Final    BLOOD RIGHT ANTECUBITAL Performed at [place] [address] [place], [place] [address]    Special Requests   Final    BOTTLES DRAWN AEROBIC AND ANAEROBIC Blood Culture adequate volume Performed at [place] [address] [place], [place] [address]    Culture   Final    NO GROWTH 3 DAYS Performed at [facility], [place] [address], [place], [place] [address]    Report Status PENDING  Incomplete     Labs:   Basic Metabolic Panel: Recent Labs  Lab 10/02/22 1625 10/03/22 1209 10/04/22 0431 10/05/22 0419  NA 131* 129* 135 134*  K 4.6 4.0 3.1* 4.0  CL 98 97* 99 102  CO2 27 24 24 23   GLUCOSE 224* 312* 77 96  BUN 15 13 11 13   CREATININE 0.72 0.71 0.61 0.61  CALCIUM 9.5 9.1 9.1 9.2  MG  --   --  1.6*  --    Liver Function Tests: Recent Labs  Lab 10/02/22 1625  AST 26  ALT 15  ALKPHOS 86  BILITOT 0.6  PROT 6.8  ALBUMIN 3.0*    CBC: Recent Labs  Lab 10/02/22 1625 10/03/22 1209 10/04/22 0431 10/05/22 0419  WBC 18.6* 14.0* 10.4 9.4  NEUTROABS 17.0*  --   --   --   HGB 10.0* 9.4* 9.5* 9.6*  HCT 30.0* 28.1* 29.7* 29.4*  MCV 97.1 97.6 100.3* 97.4  PLT 383 348 344 380    CBG: Recent Labs  Lab 10/04/22 1638 10/04/22 2032 10/05/22 0044 10/05/22 0433 10/05/22 0804  GLUCAP 455* 412* 169* 103* 97     IMAGING STUDIES DG Chest Portable 1 View  Result Date: 10/02/2022 CLINICAL DATA:  Cough, fever, possible aspiration, chronic dysphagia EXAM: PORTABLE CHEST 1 VIEW COMPARISON:  09/10/2022 FINDINGS: The heart size and mediastinal contours are within normal limits. Left chest implantable loop recorder. Both lungs are clear. The visualized skeletal structures are unremarkable. IMPRESSION: No acute abnormality of the lungs in AP portable projection. Electronically Signed   By: Delanna Ahmadi M.D.   On: 10/02/2022 17:30    DISCHARGE EXAMINATION: Vitals:   10/04/22 0441 10/04/22 1228 10/04/22 2037 10/05/22 0632  BP: (!) 162/85 (!) 152/71 (!) 150/77 (!) 145/73  Pulse: 73 95 71 94  Resp:  17 18 18   Temp: 97.9 F (36.6 C) 97.9 F (36.6 C) 97.7 F (36.5 C) 98.4 F (36.9 C)  TempSrc: Oral  Oral Oral  SpO2: 99% 100% 98% 97%  Weight:      Height:       General appearance: Awake alert.  In no distress Resp: Clear to auscultation bilaterally.  Normal effort Cardio:  S1-S2 is normal regular.  No S3-S4.  No rubs murmurs or bruit GI: Abdomen is soft.  Nontender nondistended.  Bowel sounds are present normal.  No masses organomegaly   DISPOSITION: Home with family  Discharge Instructions     Call MD for:  difficulty breathing, headache  or visual disturbances   Complete by: As directed    Call MD for:  extreme fatigue   Complete by: As directed    Call MD for:  persistant dizziness or light-headedness   Complete by: As directed    Call MD for:  persistant nausea and vomiting   Complete by: As directed    Call MD for:  severe uncontrolled pain   Complete by: As directed    Call MD for:  temperature >100.4   Complete by: As directed    Discharge instructions   Complete by: As directed    Please take your medications as prescribed.  You may resume your methotrexate from Saturday March 23rd.  You were cared for by a hospitalist during your hospital stay. If you have any questions about your discharge medications or the care you received while you were in the hospital after you are discharged, you can call the unit and asked to speak with the hospitalist on call if the hospitalist that took care of you is not available. Once you are discharged, your primary care physician will handle any further medical issues. Please note that NO REFILLS for any discharge medications will be authorized once you are discharged, as it is imperative that you return to your primary care physician (or establish a relationship with a primary care physician if you do not have one) for your aftercare needs so that they can reassess your need for medications and monitor your lab values. If you do not have a primary care physician, you can call [phone] for a physician referral.   Increase activity slowly   Complete by: As directed    No wound care   Complete by: As directed           Allergies as of 10/05/2022       Reactions   Nsaids Other (See Comments)   No NSAIDS due to  kidney function- ESPECIALLY ibuprofen or Aleve   Biaxin [clarithromycin] Hives   Bactrim [sulfamethoxazole-trimethoprim] Hives, Rash   Lisinopril Cough   Sulfa Antibiotics Hives, Rash, Other (See Comments)   NO sulfa-based meds!!        Medication List     TAKE these medications    acetaminophen 650 MG CR tablet Commonly known as: TYLENOL Take 650 mg by mouth every 8 (eight) hours as needed for pain.   amoxicillin 250 MG/5ML suspension Commonly known as: AMOXIL Take 10 mLs (500 mg total) by mouth every 8 (eight) hours for 5 days.   Bayer Low Dose 81 MG tablet Generic drug: aspirin EC Take 81 mg by mouth daily. Swallow whole.   cetirizine 10 MG tablet Commonly known as: ZYRTEC Take 10 mg by mouth in the morning, at noon, and at bedtime.   fluconazole 100 MG tablet Commonly known as: DIFLUCAN Take 1 tablet (100 mg total) by mouth daily for 3 days. Start taking on: March 19, 123456   folic acid 1 MG tablet Commonly known as: FOLVITE Take 1 mg by mouth at bedtime.   Levemir FlexTouch 100 UNIT/ML FlexPen Generic drug: insulin detemir Inject 3-7 Units into the skin See admin instructions. Inject 7 units into the skin in the morning and 3 units at bedtime if BGL is 200 or below and 4 units if BGL is greater than 200   levETIRAcetam 100 MG/ML solution Commonly known as: Keppra Take 5 mLs (500 mg total) by mouth 2 (two) times daily.   levothyroxine 100 MCG tablet Commonly known as: SYNTHROID  Take 1 tablet (100 mcg total) by mouth daily before breakfast.   liver oil-zinc oxide 40 % ointment Commonly known as: DESITIN Apply topically 3 (three) times daily. What changed:  how much to take when to take this reasons to take this   loperamide 2 MG tablet Commonly known as: IMODIUM A-D Take 2 mg by mouth 4 (four) times daily as needed for diarrhea or loose stools.   methotrexate 2.5 MG tablet Commonly known as: RHEUMATREX Take 1 tablet (2.5 mg total) by mouth once a  week. Caution:Chemotherapy. Protect from light. What changed:  how much to take when to take this   metoprolol tartrate 25 MG tablet Commonly known as: LOPRESSOR Take 1 tablet (25 mg total) by mouth 2 (two) times daily. What changed:  how much to take when to take this reasons to take this   midodrine 5 MG tablet Commonly known as: PROAMATINE Take 0.5 tablets (2.5 mg total) by mouth 3 (three) times daily with meals. What changed: how much to take   NovoLOG FlexPen 100 UNIT/ML FlexPen Generic drug: insulin aspart Inject 2-10 Units into the skin See admin instructions. Inject 2-10 units into the skin three times a day with meals, per sliding scale:  Breakfast: BGL 80-199 = 8 units; 200-299 = 9 units; 300 or greater = 10 units Lunch: BGL 80-199 = 5 units; 200-299 = 6 units; 300 or greater = 7 units Supper/evening meal: BGL 80-199 = 2 units; 200-299 = 3 units; 300 or greater = 4 units   nystatin 100000 UNIT/ML suspension Commonly known as: MYCOSTATIN Take 5 mLs (500,000 Units total) by mouth 4 (four) times daily.   PEPCID AC PO Take 20 mg by mouth at bedtime.   polyethylene glycol 17 g packet Commonly known as: MIRALAX / GLYCOLAX Take 17 g by mouth daily. What changed:  when to take this reasons to take this   predniSONE 5 MG tablet Commonly known as: DELTASONE Take 5 mg by mouth daily with breakfast.   triamcinolone ointment 0.1 % Commonly known as: KENALOG Apply 1 Application topically 2 (two) times daily as needed (itching, rash).   VITAMIN D-3 PO Take 1,000 Units by mouth daily.          Follow-up Information     Charlane Ferretti, MD. Schedule an appointment as soon as possible for a visit in 1 week(s).   Specialty: Internal Medicine Why: post hospitalization follow up Contact information: [address] [address] [phone]                 TOTAL DISCHARGE TIME: 48 minutes  [place]  [facility] Pager  on [email or web address]  10/05/2022, 11:45 AM

## 2022-10-07 DIAGNOSIS — E440 Moderate protein-calorie malnutrition: Secondary | ICD-10-CM | POA: Diagnosis not present

## 2022-10-07 DIAGNOSIS — G9341 Metabolic encephalopathy: Secondary | ICD-10-CM | POA: Diagnosis not present

## 2022-10-07 DIAGNOSIS — Z7982 Long term (current) use of aspirin: Secondary | ICD-10-CM | POA: Diagnosis not present

## 2022-10-07 DIAGNOSIS — Z79899 Other long term (current) drug therapy: Secondary | ICD-10-CM | POA: Diagnosis not present

## 2022-10-07 DIAGNOSIS — R69 Illness, unspecified: Secondary | ICD-10-CM | POA: Diagnosis not present

## 2022-10-07 DIAGNOSIS — N182 Chronic kidney disease, stage 2 (mild): Secondary | ICD-10-CM | POA: Diagnosis not present

## 2022-10-07 DIAGNOSIS — E1159 Type 2 diabetes mellitus with other circulatory complications: Secondary | ICD-10-CM | POA: Diagnosis not present

## 2022-10-07 DIAGNOSIS — Z79891 Long term (current) use of opiate analgesic: Secondary | ICD-10-CM | POA: Diagnosis not present

## 2022-10-07 DIAGNOSIS — N179 Acute kidney failure, unspecified: Secondary | ICD-10-CM | POA: Diagnosis not present

## 2022-10-07 DIAGNOSIS — M1991 Primary osteoarthritis, unspecified site: Secondary | ICD-10-CM | POA: Diagnosis not present

## 2022-10-07 DIAGNOSIS — L89152 Pressure ulcer of sacral region, stage 2: Secondary | ICD-10-CM | POA: Diagnosis not present

## 2022-10-07 DIAGNOSIS — Z794 Long term (current) use of insulin: Secondary | ICD-10-CM | POA: Diagnosis not present

## 2022-10-07 DIAGNOSIS — I152 Hypertension secondary to endocrine disorders: Secondary | ICD-10-CM | POA: Diagnosis not present

## 2022-10-07 DIAGNOSIS — D631 Anemia in chronic kidney disease: Secondary | ICD-10-CM | POA: Diagnosis not present

## 2022-10-07 DIAGNOSIS — E039 Hypothyroidism, unspecified: Secondary | ICD-10-CM | POA: Diagnosis not present

## 2022-10-07 DIAGNOSIS — E11649 Type 2 diabetes mellitus with hypoglycemia without coma: Secondary | ICD-10-CM | POA: Diagnosis not present

## 2022-10-07 DIAGNOSIS — R21 Rash and other nonspecific skin eruption: Secondary | ICD-10-CM | POA: Diagnosis not present

## 2022-10-07 DIAGNOSIS — F0390 Unspecified dementia without behavioral disturbance: Secondary | ICD-10-CM | POA: Diagnosis not present

## 2022-10-07 DIAGNOSIS — M4802 Spinal stenosis, cervical region: Secondary | ICD-10-CM | POA: Diagnosis not present

## 2022-10-07 DIAGNOSIS — M3390 Dermatopolymyositis, unspecified, organ involvement unspecified: Secondary | ICD-10-CM | POA: Diagnosis not present

## 2022-10-07 DIAGNOSIS — J690 Pneumonitis due to inhalation of food and vomit: Secondary | ICD-10-CM | POA: Diagnosis not present

## 2022-10-07 DIAGNOSIS — G40909 Epilepsy, unspecified, not intractable, without status epilepticus: Secondary | ICD-10-CM | POA: Diagnosis not present

## 2022-10-07 DIAGNOSIS — E1122 Type 2 diabetes mellitus with diabetic chronic kidney disease: Secondary | ICD-10-CM | POA: Diagnosis not present

## 2022-10-07 DIAGNOSIS — E1151 Type 2 diabetes mellitus with diabetic peripheral angiopathy without gangrene: Secondary | ICD-10-CM | POA: Diagnosis not present

## 2022-10-07 DIAGNOSIS — M6281 Muscle weakness (generalized): Secondary | ICD-10-CM | POA: Diagnosis not present

## 2022-10-07 LAB — CULTURE, BLOOD (ROUTINE X 2)
Culture: NO GROWTH
Culture: NO GROWTH
Special Requests: ADEQUATE

## 2022-10-09 DIAGNOSIS — J690 Pneumonitis due to inhalation of food and vomit: Secondary | ICD-10-CM | POA: Diagnosis not present

## 2022-10-09 DIAGNOSIS — G40909 Epilepsy, unspecified, not intractable, without status epilepticus: Secondary | ICD-10-CM | POA: Diagnosis not present

## 2022-10-09 DIAGNOSIS — Z7982 Long term (current) use of aspirin: Secondary | ICD-10-CM | POA: Diagnosis not present

## 2022-10-09 DIAGNOSIS — R69 Illness, unspecified: Secondary | ICD-10-CM | POA: Diagnosis not present

## 2022-10-09 DIAGNOSIS — E1159 Type 2 diabetes mellitus with other circulatory complications: Secondary | ICD-10-CM | POA: Diagnosis not present

## 2022-10-09 DIAGNOSIS — I152 Hypertension secondary to endocrine disorders: Secondary | ICD-10-CM | POA: Diagnosis not present

## 2022-10-09 DIAGNOSIS — L89152 Pressure ulcer of sacral region, stage 2: Secondary | ICD-10-CM | POA: Diagnosis not present

## 2022-10-09 DIAGNOSIS — F0390 Unspecified dementia without behavioral disturbance: Secondary | ICD-10-CM | POA: Diagnosis not present

## 2022-10-09 DIAGNOSIS — E039 Hypothyroidism, unspecified: Secondary | ICD-10-CM | POA: Diagnosis not present

## 2022-10-09 DIAGNOSIS — Z79891 Long term (current) use of opiate analgesic: Secondary | ICD-10-CM | POA: Diagnosis not present

## 2022-10-09 DIAGNOSIS — E440 Moderate protein-calorie malnutrition: Secondary | ICD-10-CM | POA: Diagnosis not present

## 2022-10-09 DIAGNOSIS — E11649 Type 2 diabetes mellitus with hypoglycemia without coma: Secondary | ICD-10-CM | POA: Diagnosis not present

## 2022-10-09 DIAGNOSIS — D631 Anemia in chronic kidney disease: Secondary | ICD-10-CM | POA: Diagnosis not present

## 2022-10-09 DIAGNOSIS — N182 Chronic kidney disease, stage 2 (mild): Secondary | ICD-10-CM | POA: Diagnosis not present

## 2022-10-09 DIAGNOSIS — G9341 Metabolic encephalopathy: Secondary | ICD-10-CM | POA: Diagnosis not present

## 2022-10-09 DIAGNOSIS — E1151 Type 2 diabetes mellitus with diabetic peripheral angiopathy without gangrene: Secondary | ICD-10-CM | POA: Diagnosis not present

## 2022-10-09 DIAGNOSIS — E1122 Type 2 diabetes mellitus with diabetic chronic kidney disease: Secondary | ICD-10-CM | POA: Diagnosis not present

## 2022-10-09 DIAGNOSIS — N179 Acute kidney failure, unspecified: Secondary | ICD-10-CM | POA: Diagnosis not present

## 2022-10-09 DIAGNOSIS — Z794 Long term (current) use of insulin: Secondary | ICD-10-CM | POA: Diagnosis not present

## 2022-10-12 DIAGNOSIS — E1151 Type 2 diabetes mellitus with diabetic peripheral angiopathy without gangrene: Secondary | ICD-10-CM | POA: Diagnosis not present

## 2022-10-12 DIAGNOSIS — D631 Anemia in chronic kidney disease: Secondary | ICD-10-CM | POA: Diagnosis not present

## 2022-10-12 DIAGNOSIS — E440 Moderate protein-calorie malnutrition: Secondary | ICD-10-CM | POA: Diagnosis not present

## 2022-10-12 DIAGNOSIS — E11649 Type 2 diabetes mellitus with hypoglycemia without coma: Secondary | ICD-10-CM | POA: Diagnosis not present

## 2022-10-12 DIAGNOSIS — Z794 Long term (current) use of insulin: Secondary | ICD-10-CM | POA: Diagnosis not present

## 2022-10-12 DIAGNOSIS — Z7982 Long term (current) use of aspirin: Secondary | ICD-10-CM | POA: Diagnosis not present

## 2022-10-12 DIAGNOSIS — N182 Chronic kidney disease, stage 2 (mild): Secondary | ICD-10-CM | POA: Diagnosis not present

## 2022-10-12 DIAGNOSIS — G40909 Epilepsy, unspecified, not intractable, without status epilepticus: Secondary | ICD-10-CM | POA: Diagnosis not present

## 2022-10-12 DIAGNOSIS — G9341 Metabolic encephalopathy: Secondary | ICD-10-CM | POA: Diagnosis not present

## 2022-10-12 DIAGNOSIS — L89152 Pressure ulcer of sacral region, stage 2: Secondary | ICD-10-CM | POA: Diagnosis not present

## 2022-10-12 DIAGNOSIS — E1122 Type 2 diabetes mellitus with diabetic chronic kidney disease: Secondary | ICD-10-CM | POA: Diagnosis not present

## 2022-10-12 DIAGNOSIS — R69 Illness, unspecified: Secondary | ICD-10-CM | POA: Diagnosis not present

## 2022-10-12 DIAGNOSIS — E039 Hypothyroidism, unspecified: Secondary | ICD-10-CM | POA: Diagnosis not present

## 2022-10-12 DIAGNOSIS — E1159 Type 2 diabetes mellitus with other circulatory complications: Secondary | ICD-10-CM | POA: Diagnosis not present

## 2022-10-12 DIAGNOSIS — Z79891 Long term (current) use of opiate analgesic: Secondary | ICD-10-CM | POA: Diagnosis not present

## 2022-10-12 DIAGNOSIS — J690 Pneumonitis due to inhalation of food and vomit: Secondary | ICD-10-CM | POA: Diagnosis not present

## 2022-10-12 DIAGNOSIS — I152 Hypertension secondary to endocrine disorders: Secondary | ICD-10-CM | POA: Diagnosis not present

## 2022-10-12 DIAGNOSIS — F0390 Unspecified dementia without behavioral disturbance: Secondary | ICD-10-CM | POA: Diagnosis not present

## 2022-10-12 DIAGNOSIS — N179 Acute kidney failure, unspecified: Secondary | ICD-10-CM | POA: Diagnosis not present

## 2022-10-13 DIAGNOSIS — Z79891 Long term (current) use of opiate analgesic: Secondary | ICD-10-CM | POA: Diagnosis not present

## 2022-10-13 DIAGNOSIS — G40909 Epilepsy, unspecified, not intractable, without status epilepticus: Secondary | ICD-10-CM | POA: Diagnosis not present

## 2022-10-13 DIAGNOSIS — I152 Hypertension secondary to endocrine disorders: Secondary | ICD-10-CM | POA: Diagnosis not present

## 2022-10-13 DIAGNOSIS — E1151 Type 2 diabetes mellitus with diabetic peripheral angiopathy without gangrene: Secondary | ICD-10-CM | POA: Diagnosis not present

## 2022-10-13 DIAGNOSIS — E11649 Type 2 diabetes mellitus with hypoglycemia without coma: Secondary | ICD-10-CM | POA: Diagnosis not present

## 2022-10-13 DIAGNOSIS — J690 Pneumonitis due to inhalation of food and vomit: Secondary | ICD-10-CM | POA: Diagnosis not present

## 2022-10-13 DIAGNOSIS — D631 Anemia in chronic kidney disease: Secondary | ICD-10-CM | POA: Diagnosis not present

## 2022-10-13 DIAGNOSIS — E1122 Type 2 diabetes mellitus with diabetic chronic kidney disease: Secondary | ICD-10-CM | POA: Diagnosis not present

## 2022-10-13 DIAGNOSIS — G9341 Metabolic encephalopathy: Secondary | ICD-10-CM | POA: Diagnosis not present

## 2022-10-13 DIAGNOSIS — Z794 Long term (current) use of insulin: Secondary | ICD-10-CM | POA: Diagnosis not present

## 2022-10-13 DIAGNOSIS — F0390 Unspecified dementia without behavioral disturbance: Secondary | ICD-10-CM | POA: Diagnosis not present

## 2022-10-13 DIAGNOSIS — E1159 Type 2 diabetes mellitus with other circulatory complications: Secondary | ICD-10-CM | POA: Diagnosis not present

## 2022-10-13 DIAGNOSIS — E440 Moderate protein-calorie malnutrition: Secondary | ICD-10-CM | POA: Diagnosis not present

## 2022-10-13 DIAGNOSIS — Z7982 Long term (current) use of aspirin: Secondary | ICD-10-CM | POA: Diagnosis not present

## 2022-10-13 DIAGNOSIS — R69 Illness, unspecified: Secondary | ICD-10-CM | POA: Diagnosis not present

## 2022-10-13 DIAGNOSIS — N182 Chronic kidney disease, stage 2 (mild): Secondary | ICD-10-CM | POA: Diagnosis not present

## 2022-10-13 DIAGNOSIS — L89152 Pressure ulcer of sacral region, stage 2: Secondary | ICD-10-CM | POA: Diagnosis not present

## 2022-10-13 DIAGNOSIS — E039 Hypothyroidism, unspecified: Secondary | ICD-10-CM | POA: Diagnosis not present

## 2022-10-13 DIAGNOSIS — N179 Acute kidney failure, unspecified: Secondary | ICD-10-CM | POA: Diagnosis not present

## 2022-10-14 ENCOUNTER — Emergency Department (HOSPITAL_COMMUNITY): Payer: Medicare HMO

## 2022-10-14 ENCOUNTER — Encounter (HOSPITAL_COMMUNITY): Payer: Self-pay

## 2022-10-14 ENCOUNTER — Other Ambulatory Visit: Payer: Self-pay

## 2022-10-14 ENCOUNTER — Emergency Department (HOSPITAL_COMMUNITY)
Admission: EM | Admit: 2022-10-14 | Discharge: 2022-10-14 | Disposition: A | Discharge status: Home / Self Care | Payer: Medicare HMO | Attending: Emergency Medicine | Admitting: Emergency Medicine

## 2022-10-14 DIAGNOSIS — Z79899 Other long term (current) drug therapy: Secondary | ICD-10-CM | POA: Insufficient documentation

## 2022-10-14 DIAGNOSIS — E1165 Type 2 diabetes mellitus with hyperglycemia: Secondary | ICD-10-CM | POA: Insufficient documentation

## 2022-10-14 DIAGNOSIS — R509 Fever, unspecified: Secondary | ICD-10-CM | POA: Diagnosis not present

## 2022-10-14 DIAGNOSIS — N189 Chronic kidney disease, unspecified: Secondary | ICD-10-CM | POA: Insufficient documentation

## 2022-10-14 DIAGNOSIS — R4182 Altered mental status, unspecified: Secondary | ICD-10-CM | POA: Insufficient documentation

## 2022-10-14 DIAGNOSIS — I959 Hypotension, unspecified: Secondary | ICD-10-CM | POA: Diagnosis not present

## 2022-10-14 DIAGNOSIS — E860 Dehydration: Secondary | ICD-10-CM | POA: Diagnosis not present

## 2022-10-14 DIAGNOSIS — Z794 Long term (current) use of insulin: Secondary | ICD-10-CM | POA: Diagnosis not present

## 2022-10-14 DIAGNOSIS — E1122 Type 2 diabetes mellitus with diabetic chronic kidney disease: Secondary | ICD-10-CM | POA: Diagnosis not present

## 2022-10-14 DIAGNOSIS — R0902 Hypoxemia: Secondary | ICD-10-CM | POA: Diagnosis not present

## 2022-10-14 DIAGNOSIS — E039 Hypothyroidism, unspecified: Secondary | ICD-10-CM | POA: Insufficient documentation

## 2022-10-14 DIAGNOSIS — Z743 Need for continuous supervision: Secondary | ICD-10-CM | POA: Diagnosis not present

## 2022-10-14 DIAGNOSIS — I129 Hypertensive chronic kidney disease with stage 1 through stage 4 chronic kidney disease, or unspecified chronic kidney disease: Secondary | ICD-10-CM | POA: Diagnosis not present

## 2022-10-14 LAB — URINALYSIS, ROUTINE W REFLEX MICROSCOPIC
Bacteria, UA: NONE SEEN
Bilirubin Urine: NEGATIVE
Glucose, UA: NEGATIVE mg/dL
Hgb urine dipstick: NEGATIVE
Ketones, ur: NEGATIVE mg/dL
Leukocytes,Ua: NEGATIVE
Nitrite: NEGATIVE
Protein, ur: 30 mg/dL — AB
Specific Gravity, Urine: 1.008 (ref 1.005–1.030)
pH: 7 (ref 5.0–8.0)

## 2022-10-14 LAB — CBC WITH DIFFERENTIAL/PLATELET
Abs Immature Granulocytes: 0.09 10*3/uL — ABNORMAL HIGH (ref 0.00–0.07)
Basophils Absolute: 0 10*3/uL (ref 0.0–0.1)
Basophils Relative: 0 %
Eosinophils Absolute: 0.1 10*3/uL (ref 0.0–0.5)
Eosinophils Relative: 1 %
HCT: 34.7 % — ABNORMAL LOW (ref 36.0–46.0)
Hemoglobin: 11 g/dL — ABNORMAL LOW (ref 12.0–15.0)
Immature Granulocytes: 1 %
Lymphocytes Relative: 3 %
Lymphs Abs: 0.5 10*3/uL — ABNORMAL LOW (ref 0.7–4.0)
MCH: 31.3 pg (ref 26.0–34.0)
MCHC: 31.7 g/dL (ref 30.0–36.0)
MCV: 98.6 fL (ref 80.0–100.0)
Monocytes Absolute: 0.5 10*3/uL (ref 0.1–1.0)
Monocytes Relative: 3 %
Neutro Abs: 13.2 10*3/uL — ABNORMAL HIGH (ref 1.7–7.7)
Neutrophils Relative %: 92 %
Platelets: 385 10*3/uL (ref 150–400)
RBC: 3.52 MIL/uL — ABNORMAL LOW (ref 3.87–5.11)
RDW: 14.3 % (ref 11.5–15.5)
WBC: 14.3 10*3/uL — ABNORMAL HIGH (ref 4.0–10.5)
nRBC: 0 % (ref 0.0–0.2)

## 2022-10-14 LAB — COMPREHENSIVE METABOLIC PANEL
ALT: 15 U/L (ref 0–44)
AST: 40 U/L (ref 15–41)
Albumin: 3.4 g/dL — ABNORMAL LOW (ref 3.5–5.0)
Alkaline Phosphatase: 84 U/L (ref 38–126)
Anion gap: 10 (ref 5–15)
BUN: 13 mg/dL (ref 8–23)
CO2: 27 mmol/L (ref 22–32)
Calcium: 9.7 mg/dL (ref 8.9–10.3)
Chloride: 95 mmol/L — ABNORMAL LOW (ref 98–111)
Creatinine, Ser: 0.7 mg/dL (ref 0.44–1.00)
GFR, Estimated: >60 mL/min (ref >60)
Glucose, Bld: 218 mg/dL — ABNORMAL HIGH (ref 70–99)
Potassium: 4.6 mmol/L (ref 3.5–5.1)
Sodium: 132 mmol/L — ABNORMAL LOW (ref 135–145)
Total Bilirubin: 0.9 mg/dL (ref 0.3–1.2)
Total Protein: 7.4 g/dL (ref 6.5–8.1)

## 2022-10-14 LAB — CBG MONITORING, ED: Glucose-Capillary: 193 mg/dL — ABNORMAL HIGH (ref 70–99)

## 2022-10-14 LAB — LACTIC ACID, PLASMA: Lactic Acid, Venous: 2.1 mmol/L (ref 0.5–1.9)

## 2022-10-14 MED ORDER — SODIUM CHLORIDE 0.9 % IV BOLUS
500.0000 mL | Freq: Once | INTRAVENOUS | Status: AC
  Administered 2022-10-14: 500 mL via INTRAVENOUS

## 2022-10-14 NOTE — ED Provider Notes (Signed)
[place] EMERGENCY DEPARTMENT AT [facility] Provider Note   CSN: [id number] Arrival date & time: 10/14/22  1122     History  Chief Complaint  Patient presents with   Altered Mental Status    [name] [name] is a 89 y.o. female.  Pt is a 89 yo female with pmhx significant for hypothyroidism, seizure d/o, overactive bladder, dm, hld, gerd, htn, hld, dm2, arthritis, ckd, and dermatopolymyositis.  Pt is from home.  Family called EMS due to [place].  She had a fever at home.  She was admitted from 3/15-18 for UTI.  Pt is unable to give me any hx.  Daughter said pt had a seizure at home, became sweaty, and altered.  This is what made her call EMS.       Home Medications Prior to Admission medications   Medication Sig Start Date End Date Taking? Authorizing Provider  acetaminophen (TYLENOL) 650 MG CR tablet Take 650 mg by mouth every 8 (eight) hours as needed for pain.    [provider]  BAYER LOW DOSE 81 MG EC tablet Take 81 mg by mouth daily. Swallow whole.    [provider]  cetirizine (ZYRTEC) 10 MG tablet Take 10 mg by mouth in the morning, at noon, and at bedtime.    [provider]  Cholecalciferol (VITAMIN D-3 PO) Take 1,000 Units by mouth daily.    [provider]  Famotidine (PEPCID AC PO) Take 20 mg by mouth at bedtime.    [provider]  folic acid (FOLVITE) 1 MG tablet Take 1 mg by mouth at bedtime.    [provider]  insulin aspart (NOVOLOG FLEXPEN) 100 UNIT/ML FlexPen Inject 2-10 Units into the skin See admin instructions. Inject 2-10 units into the skin three times a day with meals, per sliding scale:  Breakfast: BGL 80-199 = 8 units; 200-299 = 9 units; 300 or greater = 10 units Lunch: BGL 80-199 = 5 units; 200-299 = 6 units; 300 or greater = 7 units Supper/evening meal: BGL 80-199 = 2 units; 200-299 = 3 units; 300 or greater = 4 units    [provider]  insulin detemir (LEVEMIR FLEXTOUCH) 100  UNIT/ML FlexPen Inject 3-7 Units into the skin See admin instructions. Inject 7 units into the skin in the morning and 3 units at bedtime if BGL is 200 or below and 4 units if BGL is greater than 200    [provider]  levETIRAcetam (KEPPRA) 100 MG/ML solution Take 5 mLs (500 mg total) by mouth 2 (two) times daily. 10/05/22   Bonnielee Haff, MD  levothyroxine (SYNTHROID) 100 MCG tablet Take 1 tablet (100 mcg total) by mouth daily before breakfast. 09/12/22 10/12/22  Donne Hazel, MD  liver oil-zinc oxide (DESITIN) 40 % ointment Apply topically 3 (three) times daily. Patient taking differently: Apply 1 Application topically as needed for irritation. 09/16/21   Lavina Hamman, MD  loperamide (IMODIUM A-D) 2 MG tablet Take 2 mg by mouth 4 (four) times daily as needed for diarrhea or loose stools.    [provider]  methotrexate (RHEUMATREX) 2.5 MG tablet Take 1 tablet (2.5 mg total) by mouth once a week. Caution:Chemotherapy. Protect from light. Patient taking differently: Take 15 mg by mouth every Saturday. Caution:Chemotherapy. Protect from light. 01/27/22   Charlynne Cousins, MD  metoprolol tartrate (LOPRESSOR) 25 MG tablet Take 1 tablet (25 mg total) by mouth 2 (two) times daily. Patient taking differently: Take 12.5 mg  by mouth as needed (high blood pressure). 01/20/22   Charlynne Cousins, MD  midodrine (PROAMATINE) 5 MG tablet Take 0.5 tablets (2.5 mg total) by mouth 3 (three) times daily with meals. 10/05/22   Bonnielee Haff, MD  nystatin (MYCOSTATIN) 100000 UNIT/ML suspension Take 5 mLs (500,000 Units total) by mouth 4 (four) times daily. 10/05/22   Bonnielee Haff, MD  polyethylene glycol (MIRALAX / GLYCOLAX) 17 g packet Take 17 g by mouth daily. Patient taking differently: Take 17 g by mouth daily as needed for mild constipation. 09/16/21   Lavina Hamman, MD  predniSONE (DELTASONE) 5 MG tablet Take 5 mg by mouth daily with breakfast.    [provider]   triamcinolone ointment (KENALOG) 0.1 % Apply 1 Application topically 2 (two) times daily as needed (itching, rash). 07/16/22   [provider]      Allergies    Nsaids, Biaxin [clarithromycin], Bactrim [sulfamethoxazole-trimethoprim], Lisinopril, and Sulfa antibiotics    Review of Systems   Review of Systems  All other systems reviewed and are negative.   Physical Exam Updated Vital Signs BP (!) 155/81   Pulse (!) 107   Temp 98.9 F (37.2 C) (Rectal)   Resp 18   Ht 5\' 6"  (1.676 m)   Wt 45.4 kg   SpO2 100%   BMI 16.15 kg/m  Physical Exam Vitals and nursing note reviewed.  Constitutional:      Appearance: Normal appearance.  HENT:     Head: Normocephalic and atraumatic.     Right Ear: External ear normal.     Left Ear: External ear normal.     Nose: Nose normal.     Mouth/Throat:     Mouth: Mucous membranes are dry.  Eyes:     Extraocular Movements: Extraocular movements intact.     Conjunctiva/sclera: Conjunctivae normal.     Pupils: Pupils are equal, round, and reactive to light.  Cardiovascular:     Rate and Rhythm: Normal rate and regular rhythm.     Pulses: Normal pulses.     Heart sounds: Normal heart sounds.  Pulmonary:     Effort: Pulmonary effort is normal.     Breath sounds: Normal breath sounds.  Abdominal:     General: Abdomen is flat. Bowel sounds are normal.     Palpations: Abdomen is soft.  Musculoskeletal:        General: Normal range of motion.     Cervical back: Normal range of motion and neck supple.  Skin:    General: Skin is warm.     Capillary Refill: Capillary refill takes less than 2 seconds.  Neurological:     General: No focal deficit present.     Mental Status: She is alert. She is disoriented.  Psychiatric:        Mood and Affect: Mood normal.        Behavior: Behavior normal.     ED Results / Procedures / Treatments   Labs (all labs ordered are listed, but only abnormal results are displayed) Labs Reviewed  CBC  WITH DIFFERENTIAL/PLATELET - Abnormal; Notable for the following components:      Result Value   WBC 14.3 (*)    RBC 3.52 (*)    Hemoglobin 11.0 (*)    HCT 34.7 (*)    Neutro Abs 13.2 (*)    Lymphs Abs 0.5 (*)    Abs Immature Granulocytes 0.09 (*)    All other components within normal limits  COMPREHENSIVE METABOLIC PANEL -  Abnormal; Notable for the following components:   Sodium 132 (*)    Chloride 95 (*)    Glucose, Bld 218 (*)    Albumin 3.4 (*)    All other components within normal limits  URINALYSIS, ROUTINE W REFLEX MICROSCOPIC - Abnormal; Notable for the following components:   Color, Urine STRAW (*)    Protein, ur 30 (*)    All other components within normal limits  LACTIC ACID, PLASMA - Abnormal; Notable for the following components:   Lactic Acid, Venous 2.1 (*)    All other components within normal limits  CBG MONITORING, ED - Abnormal; Notable for the following components:   Glucose-Capillary 193 (*)    All other components within normal limits  CULTURE, BLOOD (ROUTINE X 2)  CULTURE, BLOOD (ROUTINE X 2)    EKG EKG Interpretation  Date/Time:  Wednesday October 14 2022 11:44:22 EDT Ventricular Rate:  102 PR Interval:  199 QRS Duration: 83 QT Interval:  368 QTC Calculation: 480 R Axis:   -19 Text Interpretation: Sinus tachycardia Borderline left axis deviation Nonspecific T abnormalities, lateral leads Since last tracing rate faster Confirmed by Isla Pence [phone]) on 10/14/2022 12:08:53 PM  Radiology DG Chest Portable 1 View  Result Date: 10/14/2022 CLINICAL DATA:  Altered mental status EXAM: PORTABLE CHEST 1 VIEW COMPARISON:  CXR 10/02/22 FINDINGS: Loop recorder projects over the left hemithorax. No pleural effusion. No pneumothorax. No focal airspace opacity. Normal cardiac and mediastinal contours. No radiographically apparent displaced rib fractures. Visualized upper abdomen is unremarkable. IMPRESSION: No focal airspace opacity. Electronically Signed   By:  Marin Roberts M.D.   On: 10/14/2022 12:11    Procedures Procedures    Medications Ordered in ED Medications  sodium chloride 0.9 % bolus 500 mL (0 mLs Intravenous Stopped 10/14/22 1300)  sodium chloride 0.9 % bolus 500 mL (500 mLs Intravenous New Bag/Given 10/14/22 1339)    ED Course/ Medical Decision Making/ A&P                             Medical Decision Making Amount and/or Complexity of Data Reviewed Labs: ordered. Radiology: ordered.   This patient presents to the ED for concern of ams, this involves an extensive number of treatment options, and is a complaint that carries with it a high risk of complications and morbidity.  The differential diagnosis includes infection, electrolyte abn   Co morbidities that complicate the patient evaluation  hypothyroidism, overactive bladder, dm, hld, gerd, htn, hld, dm2, arthritis, ckd, and dermatopolymyositis   Additional history obtained:  Additional history obtained from epic chart review External records from outside source obtained and reviewed including EMS report   Lab Tests:  I Ordered, and personally interpreted labs.  The pertinent results include:  ua + protein, cmp with glucose elevated at 218, lactic elevated at 2.1, cbc with hgb 11   Imaging Studies ordered:  I ordered imaging studies including cxr  I independently visualized and interpreted imaging which showed No focal airspace opacity.  I agree with the radiologist interpretation   Cardiac Monitoring:  The patient was maintained on a cardiac monitor.  I personally viewed and interpreted the cardiac monitored which showed an underlying rhythm of: st initially, but now nsr   Medicines ordered and prescription drug management:  I ordered medication including ivfs  for dehydration  Reevaluation of the patient after these medicines showed that the patient improved I have reviewed the patients home medicines  and have made adjustments as needed   Test  Considered:  ct   Critical Interventions:  ivfs   Problem List / ED Course:  AMS:  improved after IVFs.  No infections.  Possible seizure at home.  She is back to baseline according to daughter.  Pt is stable for d/c.  Return if worse.    Reevaluation:  After the interventions noted above, I reevaluated the patient and found that they have :improved   Social Determinants of Health:  Lives at home with family   Dispostion:  After consideration of the diagnostic results and the patients response to treatment, I feel that the patent would benefit from discharge with outpatient f/u.          Final Clinical Impression(s) / ED Diagnoses Final diagnoses:  Dehydration    Rx / DC Orders ED Discharge Orders     None         Isla Pence, MD 10/14/22 1444

## 2022-10-14 NOTE — ED Triage Notes (Signed)
Patient brought in by EMS due to altered mental status and fever. Pt is altered from baseline per the family.

## 2022-10-15 LAB — BLOOD CULTURE ID PANEL (REFLEXED) - BCID2

## 2022-10-16 ENCOUNTER — Emergency Department (HOSPITAL_COMMUNITY)
Admission: EM | Admit: 2022-10-16 | Discharge: 2022-10-16 | Disposition: A | Discharge status: Home / Self Care | Payer: Medicare HMO | Attending: Emergency Medicine | Admitting: Emergency Medicine

## 2022-10-16 ENCOUNTER — Encounter (HOSPITAL_COMMUNITY): Payer: Self-pay

## 2022-10-16 DIAGNOSIS — R7881 Bacteremia: Secondary | ICD-10-CM | POA: Diagnosis not present

## 2022-10-16 DIAGNOSIS — M3390 Dermatopolymyositis, unspecified, organ involvement unspecified: Secondary | ICD-10-CM | POA: Diagnosis not present

## 2022-10-16 DIAGNOSIS — E1122 Type 2 diabetes mellitus with diabetic chronic kidney disease: Secondary | ICD-10-CM | POA: Insufficient documentation

## 2022-10-16 DIAGNOSIS — Z79899 Other long term (current) drug therapy: Secondary | ICD-10-CM | POA: Diagnosis not present

## 2022-10-16 DIAGNOSIS — Z79890 Hormone replacement therapy (postmenopausal): Secondary | ICD-10-CM | POA: Insufficient documentation

## 2022-10-16 DIAGNOSIS — R799 Abnormal finding of blood chemistry, unspecified: Secondary | ICD-10-CM

## 2022-10-16 DIAGNOSIS — D649 Anemia, unspecified: Secondary | ICD-10-CM | POA: Diagnosis not present

## 2022-10-16 DIAGNOSIS — I129 Hypertensive chronic kidney disease with stage 1 through stage 4 chronic kidney disease, or unspecified chronic kidney disease: Secondary | ICD-10-CM | POA: Insufficient documentation

## 2022-10-16 DIAGNOSIS — N189 Chronic kidney disease, unspecified: Secondary | ICD-10-CM | POA: Insufficient documentation

## 2022-10-16 DIAGNOSIS — R9431 Abnormal electrocardiogram [ECG] [EKG]: Secondary | ICD-10-CM | POA: Diagnosis not present

## 2022-10-16 DIAGNOSIS — E039 Hypothyroidism, unspecified: Secondary | ICD-10-CM | POA: Diagnosis not present

## 2022-10-16 DIAGNOSIS — F0390 Unspecified dementia without behavioral disturbance: Secondary | ICD-10-CM | POA: Insufficient documentation

## 2022-10-16 DIAGNOSIS — R54 Age-related physical debility: Secondary | ICD-10-CM | POA: Diagnosis not present

## 2022-10-16 DIAGNOSIS — Z794 Long term (current) use of insulin: Secondary | ICD-10-CM | POA: Diagnosis not present

## 2022-10-16 DIAGNOSIS — M810 Age-related osteoporosis without current pathological fracture: Secondary | ICD-10-CM | POA: Diagnosis not present

## 2022-10-16 DIAGNOSIS — R69 Illness, unspecified: Secondary | ICD-10-CM | POA: Diagnosis not present

## 2022-10-16 LAB — COMPREHENSIVE METABOLIC PANEL
ALT: 14 U/L (ref 0–44)
AST: 25 U/L (ref 15–41)
Albumin: 3.2 g/dL — ABNORMAL LOW (ref 3.5–5.0)
Alkaline Phosphatase: 71 U/L (ref 38–126)
Anion gap: 8 (ref 5–15)
BUN: 16 mg/dL (ref 8–23)
CO2: 29 mmol/L (ref 22–32)
Calcium: 9.4 mg/dL (ref 8.9–10.3)
Chloride: 98 mmol/L (ref 98–111)
Creatinine, Ser: 0.74 mg/dL (ref 0.44–1.00)
GFR, Estimated: >60 mL/min (ref >60)
Glucose, Bld: 156 mg/dL — ABNORMAL HIGH (ref 70–99)
Potassium: 4.3 mmol/L (ref 3.5–5.1)
Sodium: 135 mmol/L (ref 135–145)
Total Bilirubin: 0.2 mg/dL — ABNORMAL LOW (ref 0.3–1.2)
Total Protein: 6.8 g/dL (ref 6.5–8.1)

## 2022-10-16 LAB — CBC WITH DIFFERENTIAL/PLATELET
Abs Immature Granulocytes: 0.09 10*3/uL — ABNORMAL HIGH (ref 0.00–0.07)
Basophils Absolute: 0 10*3/uL (ref 0.0–0.1)
Basophils Relative: 0 %
Eosinophils Absolute: 0.5 10*3/uL (ref 0.0–0.5)
Eosinophils Relative: 6 %
HCT: 27.9 % — ABNORMAL LOW (ref 36.0–46.0)
Hemoglobin: 8.8 g/dL — ABNORMAL LOW (ref 12.0–15.0)
Immature Granulocytes: 1 %
Lymphocytes Relative: 8 %
Lymphs Abs: 0.8 10*3/uL (ref 0.7–4.0)
MCH: 31.1 pg (ref 26.0–34.0)
MCHC: 31.5 g/dL (ref 30.0–36.0)
MCV: 98.6 fL (ref 80.0–100.0)
Monocytes Absolute: 0.5 10*3/uL (ref 0.1–1.0)
Monocytes Relative: 5 %
Neutro Abs: 7.5 10*3/uL (ref 1.7–7.7)
Neutrophils Relative %: 80 %
Platelets: 340 10*3/uL (ref 150–400)
RBC: 2.83 MIL/uL — ABNORMAL LOW (ref 3.87–5.11)
RDW: 14.5 % (ref 11.5–15.5)
WBC: 9.3 10*3/uL (ref 4.0–10.5)
nRBC: 0 % (ref 0.0–0.2)

## 2022-10-16 LAB — POC OCCULT BLOOD, ED: Fecal Occult Bld: NEGATIVE

## 2022-10-16 LAB — LACTIC ACID, PLASMA: Lactic Acid, Venous: 1.8 mmol/L (ref 0.5–1.9)

## 2022-10-16 NOTE — ED Notes (Addendum)
Called patients home phone number listed in the chart. Daughter picked up. Got report from night shift charge Charlean Sanfilippo, that patients blood cultures were positive. Told daughter that the doctors recommend for patient to come back for further evaluation and treatment.

## 2022-10-16 NOTE — ED Notes (Signed)
Rectal exam performed by Dr. Billy Fischer with this RN. Pt brief changed

## 2022-10-16 NOTE — ED Provider Notes (Signed)
[place] EMERGENCY DEPARTMENT AT [facility] Provider Note   CSN: [id number] Arrival date & time: 10/16/22  1456     History {Add pertinent medical, surgical, social history, OB history to HPI:1} Chief Complaint  Patient presents with   Abnormal Lab    [name] [name] is a 89 y.o. female.  HPI     89yo female with history of CKD, type 2 diabetes, GERD, hypertension, hypothyroidism, overactive bladder, chronic dysphagia, history of seizures, dementia, admission March 15 to March 18 with concern for urinary tract infection with sepsis and altered mental status, recent emergency department visit March 27 for altered mental status, seizure at home during which time she had blood cultures that were drawn, who presents after she was called regarding positive blood cultures.  Blood culture shows gram-positive cocci in clusters in 1 out of 2 bottles.  This culture has not speciated yet. (NOT staph aureus, epidermidis, lugdunensis)   Labs reviewed from recent visit show leukocytosis no sign of UTI     Past Medical History:  Diagnosis Date   Arthritis    "knees, legs" (06/10/2016)   Ascites    Chronic kidney disease    "related to my diabetes"   Chronic lower back pain    Diabetes mellitus without complication (HCC)    Eczema    GERD (gastroesophageal reflux disease)    High cholesterol    Hyperlipidemia    Hypertension    Hypothyroidism    OAB (overactive bladder)    Osteoporosis    Thyroid disease    Type II diabetes mellitus ([place])    Urticaria      Home Medications Prior to Admission medications   Medication Sig Start Date End Date Taking? Authorizing Provider  acetaminophen (TYLENOL) 650 MG CR tablet Take 650 mg by mouth every 8 (eight) hours as needed for pain.    [provider]  BAYER LOW DOSE 81 MG EC tablet Take 81 mg by mouth daily. Swallow whole.    [provider]  cetirizine (ZYRTEC) 10 MG tablet Take 10 mg by mouth in the  morning, at noon, and at bedtime.    [provider]  Cholecalciferol (VITAMIN D-3 PO) Take 1,000 Units by mouth daily.    [provider]  Famotidine (PEPCID AC PO) Take 20 mg by mouth at bedtime.    [provider]  folic acid (FOLVITE) 1 MG tablet Take 1 mg by mouth at bedtime.    [provider]  insulin aspart (NOVOLOG FLEXPEN) 100 UNIT/ML FlexPen Inject 2-10 Units into the skin See admin instructions. Inject 2-10 units into the skin three times a day with meals, per sliding scale:  Breakfast: BGL 80-199 = 8 units; 200-299 = 9 units; 300 or greater = 10 units Lunch: BGL 80-199 = 5 units; 200-299 = 6 units; 300 or greater = 7 units Supper/evening meal: BGL 80-199 = 2 units; 200-299 = 3 units; 300 or greater = 4 units    [provider]  insulin detemir (LEVEMIR FLEXTOUCH) 100 UNIT/ML FlexPen Inject 3-7 Units into the skin See admin instructions. Inject 7 units into the skin in the morning and 3 units at bedtime if BGL is 200 or below and 4 units if BGL is greater than 200    [provider]  levETIRAcetam (KEPPRA) 100 MG/ML solution Take 5 mLs (500 mg total) by mouth 2 (two) times daily. 10/05/22   Bonnielee Haff, MD  levothyroxine (SYNTHROID) 100 MCG tablet Take 1  tablet (100 mcg total) by mouth daily before breakfast. 09/12/22 10/12/22  Donne Hazel, MD  liver oil-zinc oxide (DESITIN) 40 % ointment Apply topically 3 (three) times daily. Patient taking differently: Apply 1 Application topically as needed for irritation. 09/16/21   Lavina Hamman, MD  loperamide (IMODIUM A-D) 2 MG tablet Take 2 mg by mouth 4 (four) times daily as needed for diarrhea or loose stools.    [provider]  methotrexate (RHEUMATREX) 2.5 MG tablet Take 1 tablet (2.5 mg total) by mouth once a week. Caution:Chemotherapy. Protect from light. Patient taking differently: Take 15 mg by mouth every Saturday. Caution:Chemotherapy. Protect from light. 01/27/22    Charlynne Cousins, MD  metoprolol tartrate (LOPRESSOR) 25 MG tablet Take 1 tablet (25 mg total) by mouth 2 (two) times daily. Patient taking differently: Take 12.5 mg by mouth as needed (high blood pressure). 01/20/22   Charlynne Cousins, MD  midodrine (PROAMATINE) 5 MG tablet Take 0.5 tablets (2.5 mg total) by mouth 3 (three) times daily with meals. 10/05/22   Bonnielee Haff, MD  nystatin (MYCOSTATIN) 100000 UNIT/ML suspension Take 5 mLs (500,000 Units total) by mouth 4 (four) times daily. 10/05/22   Bonnielee Haff, MD  polyethylene glycol (MIRALAX / GLYCOLAX) 17 g packet Take 17 g by mouth daily. Patient taking differently: Take 17 g by mouth daily as needed for mild constipation. 09/16/21   Lavina Hamman, MD  predniSONE (DELTASONE) 5 MG tablet Take 5 mg by mouth daily with breakfast.    [provider]  triamcinolone ointment (KENALOG) 0.1 % Apply 1 Application topically 2 (two) times daily as needed (itching, rash). 07/16/22   [provider]      Allergies    Nsaids, Biaxin [clarithromycin], Bactrim [sulfamethoxazole-trimethoprim], Lisinopril, and Sulfa antibiotics    Review of Systems   Review of Systems  Physical Exam Updated Vital Signs BP (!) 140/66   Pulse 68   Temp 98.3 F (36.8 C) (Oral)   Resp 17   Ht 5\' 6"  (1.676 m)   Wt 45.4 kg   SpO2 99%   BMI 16.14 kg/m  Physical Exam  ED Results / Procedures / Treatments   Labs (all labs ordered are listed, but only abnormal results are displayed) Labs Reviewed - No data to display  EKG None  Radiology No results found.  Procedures Procedures  {Document cardiac monitor, telemetry assessment procedure when appropriate:1}  Medications Ordered in ED Medications - No data to display  ED Course/ Medical Decision Making/ A&P   {   Click here for ABCD2, HEART and other calculatorsREFRESH Note before signing :1}                          Medical Decision Making  ***  {Document critical care  time when appropriate:1} {Document review of labs and clinical decision tools ie heart score, Chads2Vasc2 etc:1}  {Document your independent review of radiology images, and any outside records:1} {Document your discussion with family members, caretakers, and with consultants:1} {Document social determinants of health affecting pt's care:1} {Document your decision making why or why not admission, treatments were needed:1} Final Clinical Impression(s) / ED Diagnoses Final diagnoses:  None    Rx / DC Orders ED Discharge Orders     None

## 2022-10-16 NOTE — ED Triage Notes (Signed)
Pt arrives today after beign told to return for + blood cultures. Pt denies any new symptoms, no fever/chills, weakness, etc. Pt acting at baseline per family

## 2022-10-19 DIAGNOSIS — E1159 Type 2 diabetes mellitus with other circulatory complications: Secondary | ICD-10-CM | POA: Diagnosis not present

## 2022-10-19 DIAGNOSIS — I152 Hypertension secondary to endocrine disorders: Secondary | ICD-10-CM | POA: Diagnosis not present

## 2022-10-19 DIAGNOSIS — E1151 Type 2 diabetes mellitus with diabetic peripheral angiopathy without gangrene: Secondary | ICD-10-CM | POA: Diagnosis not present

## 2022-10-19 DIAGNOSIS — E440 Moderate protein-calorie malnutrition: Secondary | ICD-10-CM | POA: Diagnosis not present

## 2022-10-19 DIAGNOSIS — E11649 Type 2 diabetes mellitus with hypoglycemia without coma: Secondary | ICD-10-CM | POA: Diagnosis not present

## 2022-10-19 DIAGNOSIS — R69 Illness, unspecified: Secondary | ICD-10-CM | POA: Diagnosis not present

## 2022-10-19 DIAGNOSIS — L89893 Pressure ulcer of other site, stage 3: Secondary | ICD-10-CM | POA: Diagnosis not present

## 2022-10-19 DIAGNOSIS — Z7982 Long term (current) use of aspirin: Secondary | ICD-10-CM | POA: Diagnosis not present

## 2022-10-19 DIAGNOSIS — D631 Anemia in chronic kidney disease: Secondary | ICD-10-CM | POA: Diagnosis not present

## 2022-10-19 DIAGNOSIS — L89152 Pressure ulcer of sacral region, stage 2: Secondary | ICD-10-CM | POA: Diagnosis not present

## 2022-10-19 DIAGNOSIS — Z794 Long term (current) use of insulin: Secondary | ICD-10-CM | POA: Diagnosis not present

## 2022-10-19 DIAGNOSIS — S81802D Unspecified open wound, left lower leg, subsequent encounter: Secondary | ICD-10-CM | POA: Diagnosis not present

## 2022-10-19 DIAGNOSIS — S91102D Unspecified open wound of left great toe without damage to nail, subsequent encounter: Secondary | ICD-10-CM | POA: Diagnosis not present

## 2022-10-19 DIAGNOSIS — N179 Acute kidney failure, unspecified: Secondary | ICD-10-CM | POA: Diagnosis not present

## 2022-10-19 DIAGNOSIS — E039 Hypothyroidism, unspecified: Secondary | ICD-10-CM | POA: Diagnosis not present

## 2022-10-19 DIAGNOSIS — G40909 Epilepsy, unspecified, not intractable, without status epilepticus: Secondary | ICD-10-CM | POA: Diagnosis not present

## 2022-10-19 DIAGNOSIS — J690 Pneumonitis due to inhalation of food and vomit: Secondary | ICD-10-CM | POA: Diagnosis not present

## 2022-10-19 DIAGNOSIS — E1122 Type 2 diabetes mellitus with diabetic chronic kidney disease: Secondary | ICD-10-CM | POA: Diagnosis not present

## 2022-10-19 DIAGNOSIS — F0390 Unspecified dementia without behavioral disturbance: Secondary | ICD-10-CM | POA: Diagnosis not present

## 2022-10-19 DIAGNOSIS — G9341 Metabolic encephalopathy: Secondary | ICD-10-CM | POA: Diagnosis not present

## 2022-10-19 DIAGNOSIS — N182 Chronic kidney disease, stage 2 (mild): Secondary | ICD-10-CM | POA: Diagnosis not present

## 2022-10-19 DIAGNOSIS — Z79891 Long term (current) use of opiate analgesic: Secondary | ICD-10-CM | POA: Diagnosis not present

## 2022-10-19 LAB — CULTURE, BLOOD (ROUTINE X 2): Culture: NO GROWTH

## 2022-10-20 DIAGNOSIS — Z79891 Long term (current) use of opiate analgesic: Secondary | ICD-10-CM | POA: Diagnosis not present

## 2022-10-20 DIAGNOSIS — E11649 Type 2 diabetes mellitus with hypoglycemia without coma: Secondary | ICD-10-CM | POA: Diagnosis not present

## 2022-10-20 DIAGNOSIS — E039 Hypothyroidism, unspecified: Secondary | ICD-10-CM | POA: Diagnosis not present

## 2022-10-20 DIAGNOSIS — N182 Chronic kidney disease, stage 2 (mild): Secondary | ICD-10-CM | POA: Diagnosis not present

## 2022-10-20 DIAGNOSIS — Z7982 Long term (current) use of aspirin: Secondary | ICD-10-CM | POA: Diagnosis not present

## 2022-10-20 DIAGNOSIS — D631 Anemia in chronic kidney disease: Secondary | ICD-10-CM | POA: Diagnosis not present

## 2022-10-20 DIAGNOSIS — E1151 Type 2 diabetes mellitus with diabetic peripheral angiopathy without gangrene: Secondary | ICD-10-CM | POA: Diagnosis not present

## 2022-10-20 DIAGNOSIS — N179 Acute kidney failure, unspecified: Secondary | ICD-10-CM | POA: Diagnosis not present

## 2022-10-20 DIAGNOSIS — F0390 Unspecified dementia without behavioral disturbance: Secondary | ICD-10-CM | POA: Diagnosis not present

## 2022-10-20 DIAGNOSIS — G9341 Metabolic encephalopathy: Secondary | ICD-10-CM | POA: Diagnosis not present

## 2022-10-20 DIAGNOSIS — Z794 Long term (current) use of insulin: Secondary | ICD-10-CM | POA: Diagnosis not present

## 2022-10-20 DIAGNOSIS — I152 Hypertension secondary to endocrine disorders: Secondary | ICD-10-CM | POA: Diagnosis not present

## 2022-10-20 DIAGNOSIS — E1159 Type 2 diabetes mellitus with other circulatory complications: Secondary | ICD-10-CM | POA: Diagnosis not present

## 2022-10-20 DIAGNOSIS — R69 Illness, unspecified: Secondary | ICD-10-CM | POA: Diagnosis not present

## 2022-10-20 DIAGNOSIS — E440 Moderate protein-calorie malnutrition: Secondary | ICD-10-CM | POA: Diagnosis not present

## 2022-10-20 DIAGNOSIS — E1122 Type 2 diabetes mellitus with diabetic chronic kidney disease: Secondary | ICD-10-CM | POA: Diagnosis not present

## 2022-10-20 DIAGNOSIS — J690 Pneumonitis due to inhalation of food and vomit: Secondary | ICD-10-CM | POA: Diagnosis not present

## 2022-10-20 DIAGNOSIS — G40909 Epilepsy, unspecified, not intractable, without status epilepticus: Secondary | ICD-10-CM | POA: Diagnosis not present

## 2022-10-20 DIAGNOSIS — L89152 Pressure ulcer of sacral region, stage 2: Secondary | ICD-10-CM | POA: Diagnosis not present

## 2022-10-20 LAB — CULTURE, BLOOD (ROUTINE X 2): Special Requests: ADEQUATE

## 2022-10-21 ENCOUNTER — Telehealth (HOSPITAL_BASED_OUTPATIENT_CLINIC_OR_DEPARTMENT_OTHER): Payer: Self-pay | Admitting: Emergency Medicine

## 2022-10-21 LAB — CULTURE, BLOOD (ROUTINE X 2)
Culture: NO GROWTH
Culture: NO GROWTH

## 2022-10-21 NOTE — Telephone Encounter (Signed)
Post ED Visit - Positive Culture Follow-up  Culture report reviewed by antimicrobial stewardship pharmacist: [place] Team []  Elenor Quinones, Pharm.D. []  Heide Guile, Pharm.D., BCPS AQ-ID []  Parks Neptune, Pharm.D., BCPS []  Alycia Rossetti, Pharm.D., BCPS []  [place], [place].D., BCPS, AAHIVP []  Legrand Como, Pharm.D., BCPS, AAHIVP []  Salome Arnt, PharmD, BCPS []  Johnnette Gourd, PharmD, BCPS []  Hughes Better, PharmD, BCPS []  Leeroy Cha, PharmD []  Laqueta Linden, PharmD, BCPS []  Albertina Parr, PharmD  [place] Team []  Leodis Sias, PharmD []  Lindell Spar, PharmD []  Royetta Asal, PharmD []  Graylin Shiver, Rph []  Rema Fendt) Glennon Mac, PharmD []  Arlyn Dunning, PharmD []  Netta Cedars, PharmD []  Dia Sitter, PharmD []  Leone Haven, PharmD []  Gretta Arab, PharmD []  Theodis Shove, PharmD []  Peggyann Juba, PharmD []  Reuel Boom, PharmD   Positive blood culture Treated with none, recultured negative, asymptomatic,  no further patient follow-up is required at this time.  Hazle Nordmann 10/21/2022, 9:49 AM

## 2022-10-22 DIAGNOSIS — D631 Anemia in chronic kidney disease: Secondary | ICD-10-CM | POA: Diagnosis not present

## 2022-10-22 DIAGNOSIS — G9341 Metabolic encephalopathy: Secondary | ICD-10-CM | POA: Diagnosis not present

## 2022-10-22 DIAGNOSIS — I152 Hypertension secondary to endocrine disorders: Secondary | ICD-10-CM | POA: Diagnosis not present

## 2022-10-22 DIAGNOSIS — Z794 Long term (current) use of insulin: Secondary | ICD-10-CM | POA: Diagnosis not present

## 2022-10-22 DIAGNOSIS — L89152 Pressure ulcer of sacral region, stage 2: Secondary | ICD-10-CM | POA: Diagnosis not present

## 2022-10-22 DIAGNOSIS — E1151 Type 2 diabetes mellitus with diabetic peripheral angiopathy without gangrene: Secondary | ICD-10-CM | POA: Diagnosis not present

## 2022-10-22 DIAGNOSIS — L97522 Non-pressure chronic ulcer of other part of left foot with fat layer exposed: Secondary | ICD-10-CM | POA: Diagnosis not present

## 2022-10-22 DIAGNOSIS — G40909 Epilepsy, unspecified, not intractable, without status epilepticus: Secondary | ICD-10-CM | POA: Diagnosis not present

## 2022-10-22 DIAGNOSIS — E1122 Type 2 diabetes mellitus with diabetic chronic kidney disease: Secondary | ICD-10-CM | POA: Diagnosis not present

## 2022-10-22 DIAGNOSIS — L97512 Non-pressure chronic ulcer of other part of right foot with fat layer exposed: Secondary | ICD-10-CM | POA: Diagnosis not present

## 2022-10-22 DIAGNOSIS — R69 Illness, unspecified: Secondary | ICD-10-CM | POA: Diagnosis not present

## 2022-10-22 DIAGNOSIS — I739 Peripheral vascular disease, unspecified: Secondary | ICD-10-CM | POA: Diagnosis not present

## 2022-10-22 DIAGNOSIS — E1159 Type 2 diabetes mellitus with other circulatory complications: Secondary | ICD-10-CM | POA: Diagnosis not present

## 2022-10-22 DIAGNOSIS — N182 Chronic kidney disease, stage 2 (mild): Secondary | ICD-10-CM | POA: Diagnosis not present

## 2022-10-22 DIAGNOSIS — Z79891 Long term (current) use of opiate analgesic: Secondary | ICD-10-CM | POA: Diagnosis not present

## 2022-10-22 DIAGNOSIS — J690 Pneumonitis due to inhalation of food and vomit: Secondary | ICD-10-CM | POA: Diagnosis not present

## 2022-10-22 DIAGNOSIS — F0390 Unspecified dementia without behavioral disturbance: Secondary | ICD-10-CM | POA: Diagnosis not present

## 2022-10-22 DIAGNOSIS — E11649 Type 2 diabetes mellitus with hypoglycemia without coma: Secondary | ICD-10-CM | POA: Diagnosis not present

## 2022-10-22 DIAGNOSIS — L603 Nail dystrophy: Secondary | ICD-10-CM | POA: Diagnosis not present

## 2022-10-22 DIAGNOSIS — N179 Acute kidney failure, unspecified: Secondary | ICD-10-CM | POA: Diagnosis not present

## 2022-10-22 DIAGNOSIS — Z7982 Long term (current) use of aspirin: Secondary | ICD-10-CM | POA: Diagnosis not present

## 2022-10-22 DIAGNOSIS — E440 Moderate protein-calorie malnutrition: Secondary | ICD-10-CM | POA: Diagnosis not present

## 2022-10-22 DIAGNOSIS — E039 Hypothyroidism, unspecified: Secondary | ICD-10-CM | POA: Diagnosis not present

## 2022-10-23 DIAGNOSIS — E1142 Type 2 diabetes mellitus with diabetic polyneuropathy: Secondary | ICD-10-CM | POA: Diagnosis not present

## 2022-10-23 DIAGNOSIS — Z794 Long term (current) use of insulin: Secondary | ICD-10-CM | POA: Diagnosis not present

## 2022-10-23 DIAGNOSIS — L97529 Non-pressure chronic ulcer of other part of left foot with unspecified severity: Secondary | ICD-10-CM | POA: Diagnosis not present

## 2022-10-23 DIAGNOSIS — E1165 Type 2 diabetes mellitus with hyperglycemia: Secondary | ICD-10-CM | POA: Diagnosis not present

## 2022-10-23 DIAGNOSIS — E11621 Type 2 diabetes mellitus with foot ulcer: Secondary | ICD-10-CM | POA: Diagnosis not present

## 2022-10-23 DIAGNOSIS — E039 Hypothyroidism, unspecified: Secondary | ICD-10-CM | POA: Diagnosis not present

## 2022-10-26 NOTE — Progress Notes (Signed)
Carelink Summary Report / Loop Recorder 

## 2022-10-27 DIAGNOSIS — E039 Hypothyroidism, unspecified: Secondary | ICD-10-CM | POA: Diagnosis not present

## 2022-10-27 DIAGNOSIS — N182 Chronic kidney disease, stage 2 (mild): Secondary | ICD-10-CM | POA: Diagnosis not present

## 2022-10-27 DIAGNOSIS — E11649 Type 2 diabetes mellitus with hypoglycemia without coma: Secondary | ICD-10-CM | POA: Diagnosis not present

## 2022-10-27 DIAGNOSIS — E440 Moderate protein-calorie malnutrition: Secondary | ICD-10-CM | POA: Diagnosis not present

## 2022-10-27 DIAGNOSIS — E1159 Type 2 diabetes mellitus with other circulatory complications: Secondary | ICD-10-CM | POA: Diagnosis not present

## 2022-10-27 DIAGNOSIS — L89896 Pressure-induced deep tissue damage of other site: Secondary | ICD-10-CM | POA: Diagnosis not present

## 2022-10-27 DIAGNOSIS — F0390 Unspecified dementia without behavioral disturbance: Secondary | ICD-10-CM | POA: Diagnosis not present

## 2022-10-27 DIAGNOSIS — A419 Sepsis, unspecified organism: Secondary | ICD-10-CM | POA: Diagnosis not present

## 2022-10-27 DIAGNOSIS — E1151 Type 2 diabetes mellitus with diabetic peripheral angiopathy without gangrene: Secondary | ICD-10-CM | POA: Diagnosis not present

## 2022-10-27 DIAGNOSIS — N179 Acute kidney failure, unspecified: Secondary | ICD-10-CM | POA: Diagnosis not present

## 2022-10-27 DIAGNOSIS — Z79891 Long term (current) use of opiate analgesic: Secondary | ICD-10-CM | POA: Diagnosis not present

## 2022-10-27 DIAGNOSIS — I152 Hypertension secondary to endocrine disorders: Secondary | ICD-10-CM | POA: Diagnosis not present

## 2022-10-27 DIAGNOSIS — R1312 Dysphagia, oropharyngeal phase: Secondary | ICD-10-CM | POA: Diagnosis not present

## 2022-10-27 DIAGNOSIS — J690 Pneumonitis due to inhalation of food and vomit: Secondary | ICD-10-CM | POA: Diagnosis not present

## 2022-10-27 DIAGNOSIS — E1122 Type 2 diabetes mellitus with diabetic chronic kidney disease: Secondary | ICD-10-CM | POA: Diagnosis not present

## 2022-10-27 DIAGNOSIS — Z794 Long term (current) use of insulin: Secondary | ICD-10-CM | POA: Diagnosis not present

## 2022-10-27 DIAGNOSIS — G9341 Metabolic encephalopathy: Secondary | ICD-10-CM | POA: Diagnosis not present

## 2022-10-27 DIAGNOSIS — N390 Urinary tract infection, site not specified: Secondary | ICD-10-CM | POA: Diagnosis not present

## 2022-10-27 DIAGNOSIS — Z7982 Long term (current) use of aspirin: Secondary | ICD-10-CM | POA: Diagnosis not present

## 2022-10-27 DIAGNOSIS — D631 Anemia in chronic kidney disease: Secondary | ICD-10-CM | POA: Diagnosis not present

## 2022-10-27 DIAGNOSIS — L89152 Pressure ulcer of sacral region, stage 2: Secondary | ICD-10-CM | POA: Diagnosis not present

## 2022-10-27 DIAGNOSIS — G40909 Epilepsy, unspecified, not intractable, without status epilepticus: Secondary | ICD-10-CM | POA: Diagnosis not present

## 2022-10-28 ENCOUNTER — Ambulatory Visit: Payer: Medicare HMO

## 2022-10-28 DIAGNOSIS — R1312 Dysphagia, oropharyngeal phase: Secondary | ICD-10-CM | POA: Diagnosis not present

## 2022-10-28 DIAGNOSIS — Z7982 Long term (current) use of aspirin: Secondary | ICD-10-CM | POA: Diagnosis not present

## 2022-10-28 DIAGNOSIS — E1151 Type 2 diabetes mellitus with diabetic peripheral angiopathy without gangrene: Secondary | ICD-10-CM | POA: Diagnosis not present

## 2022-10-28 DIAGNOSIS — L89896 Pressure-induced deep tissue damage of other site: Secondary | ICD-10-CM | POA: Diagnosis not present

## 2022-10-28 DIAGNOSIS — L89152 Pressure ulcer of sacral region, stage 2: Secondary | ICD-10-CM | POA: Diagnosis not present

## 2022-10-28 DIAGNOSIS — J690 Pneumonitis due to inhalation of food and vomit: Secondary | ICD-10-CM | POA: Diagnosis not present

## 2022-10-28 DIAGNOSIS — N179 Acute kidney failure, unspecified: Secondary | ICD-10-CM | POA: Diagnosis not present

## 2022-10-28 DIAGNOSIS — E440 Moderate protein-calorie malnutrition: Secondary | ICD-10-CM | POA: Diagnosis not present

## 2022-10-28 DIAGNOSIS — D631 Anemia in chronic kidney disease: Secondary | ICD-10-CM | POA: Diagnosis not present

## 2022-10-28 DIAGNOSIS — N390 Urinary tract infection, site not specified: Secondary | ICD-10-CM | POA: Diagnosis not present

## 2022-10-28 DIAGNOSIS — N182 Chronic kidney disease, stage 2 (mild): Secondary | ICD-10-CM | POA: Diagnosis not present

## 2022-10-28 DIAGNOSIS — E1122 Type 2 diabetes mellitus with diabetic chronic kidney disease: Secondary | ICD-10-CM | POA: Diagnosis not present

## 2022-10-28 DIAGNOSIS — Z79891 Long term (current) use of opiate analgesic: Secondary | ICD-10-CM | POA: Diagnosis not present

## 2022-10-28 DIAGNOSIS — A419 Sepsis, unspecified organism: Secondary | ICD-10-CM | POA: Diagnosis not present

## 2022-10-28 DIAGNOSIS — G9341 Metabolic encephalopathy: Secondary | ICD-10-CM | POA: Diagnosis not present

## 2022-10-28 DIAGNOSIS — E11649 Type 2 diabetes mellitus with hypoglycemia without coma: Secondary | ICD-10-CM | POA: Diagnosis not present

## 2022-10-28 DIAGNOSIS — G40909 Epilepsy, unspecified, not intractable, without status epilepticus: Secondary | ICD-10-CM | POA: Diagnosis not present

## 2022-10-28 DIAGNOSIS — Z794 Long term (current) use of insulin: Secondary | ICD-10-CM | POA: Diagnosis not present

## 2022-10-28 DIAGNOSIS — E1159 Type 2 diabetes mellitus with other circulatory complications: Secondary | ICD-10-CM | POA: Diagnosis not present

## 2022-10-28 DIAGNOSIS — F0390 Unspecified dementia without behavioral disturbance: Secondary | ICD-10-CM | POA: Diagnosis not present

## 2022-10-28 DIAGNOSIS — I152 Hypertension secondary to endocrine disorders: Secondary | ICD-10-CM | POA: Diagnosis not present

## 2022-10-28 DIAGNOSIS — E039 Hypothyroidism, unspecified: Secondary | ICD-10-CM | POA: Diagnosis not present

## 2022-10-30 DIAGNOSIS — J690 Pneumonitis due to inhalation of food and vomit: Secondary | ICD-10-CM | POA: Diagnosis not present

## 2022-10-30 DIAGNOSIS — R1312 Dysphagia, oropharyngeal phase: Secondary | ICD-10-CM | POA: Diagnosis not present

## 2022-10-30 DIAGNOSIS — G40909 Epilepsy, unspecified, not intractable, without status epilepticus: Secondary | ICD-10-CM | POA: Diagnosis not present

## 2022-10-30 DIAGNOSIS — I152 Hypertension secondary to endocrine disorders: Secondary | ICD-10-CM | POA: Diagnosis not present

## 2022-10-30 DIAGNOSIS — N390 Urinary tract infection, site not specified: Secondary | ICD-10-CM | POA: Diagnosis not present

## 2022-10-30 DIAGNOSIS — D631 Anemia in chronic kidney disease: Secondary | ICD-10-CM | POA: Diagnosis not present

## 2022-10-30 DIAGNOSIS — N182 Chronic kidney disease, stage 2 (mild): Secondary | ICD-10-CM | POA: Diagnosis not present

## 2022-10-30 DIAGNOSIS — Z7982 Long term (current) use of aspirin: Secondary | ICD-10-CM | POA: Diagnosis not present

## 2022-10-30 DIAGNOSIS — N179 Acute kidney failure, unspecified: Secondary | ICD-10-CM | POA: Diagnosis not present

## 2022-10-30 DIAGNOSIS — Z794 Long term (current) use of insulin: Secondary | ICD-10-CM | POA: Diagnosis not present

## 2022-10-30 DIAGNOSIS — E039 Hypothyroidism, unspecified: Secondary | ICD-10-CM | POA: Diagnosis not present

## 2022-10-30 DIAGNOSIS — E1122 Type 2 diabetes mellitus with diabetic chronic kidney disease: Secondary | ICD-10-CM | POA: Diagnosis not present

## 2022-10-30 DIAGNOSIS — A419 Sepsis, unspecified organism: Secondary | ICD-10-CM | POA: Diagnosis not present

## 2022-10-30 DIAGNOSIS — E1151 Type 2 diabetes mellitus with diabetic peripheral angiopathy without gangrene: Secondary | ICD-10-CM | POA: Diagnosis not present

## 2022-10-30 DIAGNOSIS — E11649 Type 2 diabetes mellitus with hypoglycemia without coma: Secondary | ICD-10-CM | POA: Diagnosis not present

## 2022-10-30 DIAGNOSIS — L89896 Pressure-induced deep tissue damage of other site: Secondary | ICD-10-CM | POA: Diagnosis not present

## 2022-10-30 DIAGNOSIS — E440 Moderate protein-calorie malnutrition: Secondary | ICD-10-CM | POA: Diagnosis not present

## 2022-10-30 DIAGNOSIS — F0390 Unspecified dementia without behavioral disturbance: Secondary | ICD-10-CM | POA: Diagnosis not present

## 2022-10-30 DIAGNOSIS — G9341 Metabolic encephalopathy: Secondary | ICD-10-CM | POA: Diagnosis not present

## 2022-10-30 DIAGNOSIS — L89152 Pressure ulcer of sacral region, stage 2: Secondary | ICD-10-CM | POA: Diagnosis not present

## 2022-10-30 DIAGNOSIS — E1159 Type 2 diabetes mellitus with other circulatory complications: Secondary | ICD-10-CM | POA: Diagnosis not present

## 2022-10-30 DIAGNOSIS — Z79891 Long term (current) use of opiate analgesic: Secondary | ICD-10-CM | POA: Diagnosis not present

## 2022-11-02 DIAGNOSIS — G9341 Metabolic encephalopathy: Secondary | ICD-10-CM | POA: Diagnosis not present

## 2022-11-02 DIAGNOSIS — J690 Pneumonitis due to inhalation of food and vomit: Secondary | ICD-10-CM | POA: Diagnosis not present

## 2022-11-02 DIAGNOSIS — E11649 Type 2 diabetes mellitus with hypoglycemia without coma: Secondary | ICD-10-CM | POA: Diagnosis not present

## 2022-11-02 DIAGNOSIS — Z794 Long term (current) use of insulin: Secondary | ICD-10-CM | POA: Diagnosis not present

## 2022-11-02 DIAGNOSIS — Z79891 Long term (current) use of opiate analgesic: Secondary | ICD-10-CM | POA: Diagnosis not present

## 2022-11-02 DIAGNOSIS — E440 Moderate protein-calorie malnutrition: Secondary | ICD-10-CM | POA: Diagnosis not present

## 2022-11-02 DIAGNOSIS — E039 Hypothyroidism, unspecified: Secondary | ICD-10-CM | POA: Diagnosis not present

## 2022-11-02 DIAGNOSIS — L89152 Pressure ulcer of sacral region, stage 2: Secondary | ICD-10-CM | POA: Diagnosis not present

## 2022-11-02 DIAGNOSIS — L89896 Pressure-induced deep tissue damage of other site: Secondary | ICD-10-CM | POA: Diagnosis not present

## 2022-11-02 DIAGNOSIS — E1151 Type 2 diabetes mellitus with diabetic peripheral angiopathy without gangrene: Secondary | ICD-10-CM | POA: Diagnosis not present

## 2022-11-02 DIAGNOSIS — D631 Anemia in chronic kidney disease: Secondary | ICD-10-CM | POA: Diagnosis not present

## 2022-11-02 DIAGNOSIS — N179 Acute kidney failure, unspecified: Secondary | ICD-10-CM | POA: Diagnosis not present

## 2022-11-02 DIAGNOSIS — E1122 Type 2 diabetes mellitus with diabetic chronic kidney disease: Secondary | ICD-10-CM | POA: Diagnosis not present

## 2022-11-02 DIAGNOSIS — E1159 Type 2 diabetes mellitus with other circulatory complications: Secondary | ICD-10-CM | POA: Diagnosis not present

## 2022-11-02 DIAGNOSIS — G40909 Epilepsy, unspecified, not intractable, without status epilepticus: Secondary | ICD-10-CM | POA: Diagnosis not present

## 2022-11-02 DIAGNOSIS — N182 Chronic kidney disease, stage 2 (mild): Secondary | ICD-10-CM | POA: Diagnosis not present

## 2022-11-02 DIAGNOSIS — N390 Urinary tract infection, site not specified: Secondary | ICD-10-CM | POA: Diagnosis not present

## 2022-11-02 DIAGNOSIS — F0390 Unspecified dementia without behavioral disturbance: Secondary | ICD-10-CM | POA: Diagnosis not present

## 2022-11-02 DIAGNOSIS — I152 Hypertension secondary to endocrine disorders: Secondary | ICD-10-CM | POA: Diagnosis not present

## 2022-11-02 DIAGNOSIS — A419 Sepsis, unspecified organism: Secondary | ICD-10-CM | POA: Diagnosis not present

## 2022-11-02 DIAGNOSIS — Z7982 Long term (current) use of aspirin: Secondary | ICD-10-CM | POA: Diagnosis not present

## 2022-11-02 DIAGNOSIS — R1312 Dysphagia, oropharyngeal phase: Secondary | ICD-10-CM | POA: Diagnosis not present

## 2022-11-03 DIAGNOSIS — G9341 Metabolic encephalopathy: Secondary | ICD-10-CM | POA: Diagnosis not present

## 2022-11-03 DIAGNOSIS — E11649 Type 2 diabetes mellitus with hypoglycemia without coma: Secondary | ICD-10-CM | POA: Diagnosis not present

## 2022-11-03 DIAGNOSIS — Z79891 Long term (current) use of opiate analgesic: Secondary | ICD-10-CM | POA: Diagnosis not present

## 2022-11-03 DIAGNOSIS — G40909 Epilepsy, unspecified, not intractable, without status epilepticus: Secondary | ICD-10-CM | POA: Diagnosis not present

## 2022-11-03 DIAGNOSIS — I152 Hypertension secondary to endocrine disorders: Secondary | ICD-10-CM | POA: Diagnosis not present

## 2022-11-03 DIAGNOSIS — N182 Chronic kidney disease, stage 2 (mild): Secondary | ICD-10-CM | POA: Diagnosis not present

## 2022-11-03 DIAGNOSIS — R1312 Dysphagia, oropharyngeal phase: Secondary | ICD-10-CM | POA: Diagnosis not present

## 2022-11-03 DIAGNOSIS — N390 Urinary tract infection, site not specified: Secondary | ICD-10-CM | POA: Diagnosis not present

## 2022-11-03 DIAGNOSIS — E1151 Type 2 diabetes mellitus with diabetic peripheral angiopathy without gangrene: Secondary | ICD-10-CM | POA: Diagnosis not present

## 2022-11-03 DIAGNOSIS — L89896 Pressure-induced deep tissue damage of other site: Secondary | ICD-10-CM | POA: Diagnosis not present

## 2022-11-03 DIAGNOSIS — Z7982 Long term (current) use of aspirin: Secondary | ICD-10-CM | POA: Diagnosis not present

## 2022-11-03 DIAGNOSIS — F0390 Unspecified dementia without behavioral disturbance: Secondary | ICD-10-CM | POA: Diagnosis not present

## 2022-11-03 DIAGNOSIS — N179 Acute kidney failure, unspecified: Secondary | ICD-10-CM | POA: Diagnosis not present

## 2022-11-03 DIAGNOSIS — Z794 Long term (current) use of insulin: Secondary | ICD-10-CM | POA: Diagnosis not present

## 2022-11-03 DIAGNOSIS — L89152 Pressure ulcer of sacral region, stage 2: Secondary | ICD-10-CM | POA: Diagnosis not present

## 2022-11-03 DIAGNOSIS — E1159 Type 2 diabetes mellitus with other circulatory complications: Secondary | ICD-10-CM | POA: Diagnosis not present

## 2022-11-03 DIAGNOSIS — E1122 Type 2 diabetes mellitus with diabetic chronic kidney disease: Secondary | ICD-10-CM | POA: Diagnosis not present

## 2022-11-03 DIAGNOSIS — E440 Moderate protein-calorie malnutrition: Secondary | ICD-10-CM | POA: Diagnosis not present

## 2022-11-03 DIAGNOSIS — E039 Hypothyroidism, unspecified: Secondary | ICD-10-CM | POA: Diagnosis not present

## 2022-11-03 DIAGNOSIS — D631 Anemia in chronic kidney disease: Secondary | ICD-10-CM | POA: Diagnosis not present

## 2022-11-03 DIAGNOSIS — A419 Sepsis, unspecified organism: Secondary | ICD-10-CM | POA: Diagnosis not present

## 2022-11-03 DIAGNOSIS — J690 Pneumonitis due to inhalation of food and vomit: Secondary | ICD-10-CM | POA: Diagnosis not present

## 2022-11-04 DIAGNOSIS — N182 Chronic kidney disease, stage 2 (mild): Secondary | ICD-10-CM | POA: Diagnosis not present

## 2022-11-04 DIAGNOSIS — G9341 Metabolic encephalopathy: Secondary | ICD-10-CM | POA: Diagnosis not present

## 2022-11-04 DIAGNOSIS — E440 Moderate protein-calorie malnutrition: Secondary | ICD-10-CM | POA: Diagnosis not present

## 2022-11-04 DIAGNOSIS — I152 Hypertension secondary to endocrine disorders: Secondary | ICD-10-CM | POA: Diagnosis not present

## 2022-11-04 DIAGNOSIS — J690 Pneumonitis due to inhalation of food and vomit: Secondary | ICD-10-CM | POA: Diagnosis not present

## 2022-11-04 DIAGNOSIS — N179 Acute kidney failure, unspecified: Secondary | ICD-10-CM | POA: Diagnosis not present

## 2022-11-04 DIAGNOSIS — G40909 Epilepsy, unspecified, not intractable, without status epilepticus: Secondary | ICD-10-CM | POA: Diagnosis not present

## 2022-11-04 DIAGNOSIS — E1122 Type 2 diabetes mellitus with diabetic chronic kidney disease: Secondary | ICD-10-CM | POA: Diagnosis not present

## 2022-11-04 DIAGNOSIS — E1159 Type 2 diabetes mellitus with other circulatory complications: Secondary | ICD-10-CM | POA: Diagnosis not present

## 2022-11-04 DIAGNOSIS — L89896 Pressure-induced deep tissue damage of other site: Secondary | ICD-10-CM | POA: Diagnosis not present

## 2022-11-04 DIAGNOSIS — E1151 Type 2 diabetes mellitus with diabetic peripheral angiopathy without gangrene: Secondary | ICD-10-CM | POA: Diagnosis not present

## 2022-11-04 DIAGNOSIS — D631 Anemia in chronic kidney disease: Secondary | ICD-10-CM | POA: Diagnosis not present

## 2022-11-04 DIAGNOSIS — F0390 Unspecified dementia without behavioral disturbance: Secondary | ICD-10-CM | POA: Diagnosis not present

## 2022-11-04 DIAGNOSIS — L89152 Pressure ulcer of sacral region, stage 2: Secondary | ICD-10-CM | POA: Diagnosis not present

## 2022-11-04 DIAGNOSIS — Z794 Long term (current) use of insulin: Secondary | ICD-10-CM | POA: Diagnosis not present

## 2022-11-04 DIAGNOSIS — R1312 Dysphagia, oropharyngeal phase: Secondary | ICD-10-CM | POA: Diagnosis not present

## 2022-11-04 DIAGNOSIS — N390 Urinary tract infection, site not specified: Secondary | ICD-10-CM | POA: Diagnosis not present

## 2022-11-04 DIAGNOSIS — A419 Sepsis, unspecified organism: Secondary | ICD-10-CM | POA: Diagnosis not present

## 2022-11-04 DIAGNOSIS — Z7982 Long term (current) use of aspirin: Secondary | ICD-10-CM | POA: Diagnosis not present

## 2022-11-04 DIAGNOSIS — Z79891 Long term (current) use of opiate analgesic: Secondary | ICD-10-CM | POA: Diagnosis not present

## 2022-11-04 DIAGNOSIS — E039 Hypothyroidism, unspecified: Secondary | ICD-10-CM | POA: Diagnosis not present

## 2022-11-04 DIAGNOSIS — E11649 Type 2 diabetes mellitus with hypoglycemia without coma: Secondary | ICD-10-CM | POA: Diagnosis not present

## 2022-11-05 DIAGNOSIS — L97512 Non-pressure chronic ulcer of other part of right foot with fat layer exposed: Secondary | ICD-10-CM | POA: Diagnosis not present

## 2022-11-05 DIAGNOSIS — L97522 Non-pressure chronic ulcer of other part of left foot with fat layer exposed: Secondary | ICD-10-CM | POA: Diagnosis not present

## 2022-11-06 DIAGNOSIS — E1159 Type 2 diabetes mellitus with other circulatory complications: Secondary | ICD-10-CM | POA: Diagnosis not present

## 2022-11-06 DIAGNOSIS — N182 Chronic kidney disease, stage 2 (mild): Secondary | ICD-10-CM | POA: Diagnosis not present

## 2022-11-06 DIAGNOSIS — Z7982 Long term (current) use of aspirin: Secondary | ICD-10-CM | POA: Diagnosis not present

## 2022-11-06 DIAGNOSIS — E1122 Type 2 diabetes mellitus with diabetic chronic kidney disease: Secondary | ICD-10-CM | POA: Diagnosis not present

## 2022-11-06 DIAGNOSIS — J690 Pneumonitis due to inhalation of food and vomit: Secondary | ICD-10-CM | POA: Diagnosis not present

## 2022-11-06 DIAGNOSIS — G40909 Epilepsy, unspecified, not intractable, without status epilepticus: Secondary | ICD-10-CM | POA: Diagnosis not present

## 2022-11-06 DIAGNOSIS — E1151 Type 2 diabetes mellitus with diabetic peripheral angiopathy without gangrene: Secondary | ICD-10-CM | POA: Diagnosis not present

## 2022-11-06 DIAGNOSIS — E440 Moderate protein-calorie malnutrition: Secondary | ICD-10-CM | POA: Diagnosis not present

## 2022-11-06 DIAGNOSIS — F0390 Unspecified dementia without behavioral disturbance: Secondary | ICD-10-CM | POA: Diagnosis not present

## 2022-11-06 DIAGNOSIS — L89152 Pressure ulcer of sacral region, stage 2: Secondary | ICD-10-CM | POA: Diagnosis not present

## 2022-11-06 DIAGNOSIS — N179 Acute kidney failure, unspecified: Secondary | ICD-10-CM | POA: Diagnosis not present

## 2022-11-06 DIAGNOSIS — Z79891 Long term (current) use of opiate analgesic: Secondary | ICD-10-CM | POA: Diagnosis not present

## 2022-11-06 DIAGNOSIS — E039 Hypothyroidism, unspecified: Secondary | ICD-10-CM | POA: Diagnosis not present

## 2022-11-06 DIAGNOSIS — A419 Sepsis, unspecified organism: Secondary | ICD-10-CM | POA: Diagnosis not present

## 2022-11-06 DIAGNOSIS — G9341 Metabolic encephalopathy: Secondary | ICD-10-CM | POA: Diagnosis not present

## 2022-11-06 DIAGNOSIS — E11649 Type 2 diabetes mellitus with hypoglycemia without coma: Secondary | ICD-10-CM | POA: Diagnosis not present

## 2022-11-06 DIAGNOSIS — Z794 Long term (current) use of insulin: Secondary | ICD-10-CM | POA: Diagnosis not present

## 2022-11-06 DIAGNOSIS — D631 Anemia in chronic kidney disease: Secondary | ICD-10-CM | POA: Diagnosis not present

## 2022-11-06 DIAGNOSIS — I152 Hypertension secondary to endocrine disorders: Secondary | ICD-10-CM | POA: Diagnosis not present

## 2022-11-06 DIAGNOSIS — N390 Urinary tract infection, site not specified: Secondary | ICD-10-CM | POA: Diagnosis not present

## 2022-11-06 DIAGNOSIS — R1312 Dysphagia, oropharyngeal phase: Secondary | ICD-10-CM | POA: Diagnosis not present

## 2022-11-06 DIAGNOSIS — L89896 Pressure-induced deep tissue damage of other site: Secondary | ICD-10-CM | POA: Diagnosis not present

## 2022-11-09 DIAGNOSIS — L97111 Non-pressure chronic ulcer of right thigh limited to breakdown of skin: Secondary | ICD-10-CM | POA: Diagnosis not present

## 2022-11-10 DIAGNOSIS — E1122 Type 2 diabetes mellitus with diabetic chronic kidney disease: Secondary | ICD-10-CM | POA: Diagnosis not present

## 2022-11-10 DIAGNOSIS — N390 Urinary tract infection, site not specified: Secondary | ICD-10-CM | POA: Diagnosis not present

## 2022-11-10 DIAGNOSIS — G40909 Epilepsy, unspecified, not intractable, without status epilepticus: Secondary | ICD-10-CM | POA: Diagnosis not present

## 2022-11-10 DIAGNOSIS — E1159 Type 2 diabetes mellitus with other circulatory complications: Secondary | ICD-10-CM | POA: Diagnosis not present

## 2022-11-10 DIAGNOSIS — R1312 Dysphagia, oropharyngeal phase: Secondary | ICD-10-CM | POA: Diagnosis not present

## 2022-11-10 DIAGNOSIS — E039 Hypothyroidism, unspecified: Secondary | ICD-10-CM | POA: Diagnosis not present

## 2022-11-10 DIAGNOSIS — A419 Sepsis, unspecified organism: Secondary | ICD-10-CM | POA: Diagnosis not present

## 2022-11-10 DIAGNOSIS — F0390 Unspecified dementia without behavioral disturbance: Secondary | ICD-10-CM | POA: Diagnosis not present

## 2022-11-10 DIAGNOSIS — Z79891 Long term (current) use of opiate analgesic: Secondary | ICD-10-CM | POA: Diagnosis not present

## 2022-11-10 DIAGNOSIS — G9341 Metabolic encephalopathy: Secondary | ICD-10-CM | POA: Diagnosis not present

## 2022-11-10 DIAGNOSIS — Z7982 Long term (current) use of aspirin: Secondary | ICD-10-CM | POA: Diagnosis not present

## 2022-11-10 DIAGNOSIS — E1151 Type 2 diabetes mellitus with diabetic peripheral angiopathy without gangrene: Secondary | ICD-10-CM | POA: Diagnosis not present

## 2022-11-10 DIAGNOSIS — J690 Pneumonitis due to inhalation of food and vomit: Secondary | ICD-10-CM | POA: Diagnosis not present

## 2022-11-10 DIAGNOSIS — L89896 Pressure-induced deep tissue damage of other site: Secondary | ICD-10-CM | POA: Diagnosis not present

## 2022-11-10 DIAGNOSIS — E11649 Type 2 diabetes mellitus with hypoglycemia without coma: Secondary | ICD-10-CM | POA: Diagnosis not present

## 2022-11-10 DIAGNOSIS — N179 Acute kidney failure, unspecified: Secondary | ICD-10-CM | POA: Diagnosis not present

## 2022-11-10 DIAGNOSIS — E440 Moderate protein-calorie malnutrition: Secondary | ICD-10-CM | POA: Diagnosis not present

## 2022-11-10 DIAGNOSIS — Z794 Long term (current) use of insulin: Secondary | ICD-10-CM | POA: Diagnosis not present

## 2022-11-10 DIAGNOSIS — L89152 Pressure ulcer of sacral region, stage 2: Secondary | ICD-10-CM | POA: Diagnosis not present

## 2022-11-10 DIAGNOSIS — N182 Chronic kidney disease, stage 2 (mild): Secondary | ICD-10-CM | POA: Diagnosis not present

## 2022-11-10 DIAGNOSIS — I152 Hypertension secondary to endocrine disorders: Secondary | ICD-10-CM | POA: Diagnosis not present

## 2022-11-10 DIAGNOSIS — D631 Anemia in chronic kidney disease: Secondary | ICD-10-CM | POA: Diagnosis not present

## 2022-11-13 DIAGNOSIS — I152 Hypertension secondary to endocrine disorders: Secondary | ICD-10-CM | POA: Diagnosis not present

## 2022-11-13 DIAGNOSIS — A419 Sepsis, unspecified organism: Secondary | ICD-10-CM | POA: Diagnosis not present

## 2022-11-13 DIAGNOSIS — E1151 Type 2 diabetes mellitus with diabetic peripheral angiopathy without gangrene: Secondary | ICD-10-CM | POA: Diagnosis not present

## 2022-11-13 DIAGNOSIS — D631 Anemia in chronic kidney disease: Secondary | ICD-10-CM | POA: Diagnosis not present

## 2022-11-13 DIAGNOSIS — J690 Pneumonitis due to inhalation of food and vomit: Secondary | ICD-10-CM | POA: Diagnosis not present

## 2022-11-13 DIAGNOSIS — E1159 Type 2 diabetes mellitus with other circulatory complications: Secondary | ICD-10-CM | POA: Diagnosis not present

## 2022-11-13 DIAGNOSIS — N390 Urinary tract infection, site not specified: Secondary | ICD-10-CM | POA: Diagnosis not present

## 2022-11-13 DIAGNOSIS — E11649 Type 2 diabetes mellitus with hypoglycemia without coma: Secondary | ICD-10-CM | POA: Diagnosis not present

## 2022-11-13 DIAGNOSIS — N179 Acute kidney failure, unspecified: Secondary | ICD-10-CM | POA: Diagnosis not present

## 2022-11-13 DIAGNOSIS — G9341 Metabolic encephalopathy: Secondary | ICD-10-CM | POA: Diagnosis not present

## 2022-11-13 DIAGNOSIS — E1122 Type 2 diabetes mellitus with diabetic chronic kidney disease: Secondary | ICD-10-CM | POA: Diagnosis not present

## 2022-11-13 DIAGNOSIS — F0390 Unspecified dementia without behavioral disturbance: Secondary | ICD-10-CM | POA: Diagnosis not present

## 2022-11-13 DIAGNOSIS — R1312 Dysphagia, oropharyngeal phase: Secondary | ICD-10-CM | POA: Diagnosis not present

## 2022-11-13 DIAGNOSIS — Z794 Long term (current) use of insulin: Secondary | ICD-10-CM | POA: Diagnosis not present

## 2022-11-13 DIAGNOSIS — Z7982 Long term (current) use of aspirin: Secondary | ICD-10-CM | POA: Diagnosis not present

## 2022-11-13 DIAGNOSIS — E039 Hypothyroidism, unspecified: Secondary | ICD-10-CM | POA: Diagnosis not present

## 2022-11-13 DIAGNOSIS — L89152 Pressure ulcer of sacral region, stage 2: Secondary | ICD-10-CM | POA: Diagnosis not present

## 2022-11-13 DIAGNOSIS — G40909 Epilepsy, unspecified, not intractable, without status epilepticus: Secondary | ICD-10-CM | POA: Diagnosis not present

## 2022-11-13 DIAGNOSIS — Z79891 Long term (current) use of opiate analgesic: Secondary | ICD-10-CM | POA: Diagnosis not present

## 2022-11-13 DIAGNOSIS — N182 Chronic kidney disease, stage 2 (mild): Secondary | ICD-10-CM | POA: Diagnosis not present

## 2022-11-13 DIAGNOSIS — L89896 Pressure-induced deep tissue damage of other site: Secondary | ICD-10-CM | POA: Diagnosis not present

## 2022-11-13 DIAGNOSIS — E440 Moderate protein-calorie malnutrition: Secondary | ICD-10-CM | POA: Diagnosis not present

## 2022-11-16 DIAGNOSIS — I152 Hypertension secondary to endocrine disorders: Secondary | ICD-10-CM | POA: Diagnosis not present

## 2022-11-16 DIAGNOSIS — E1122 Type 2 diabetes mellitus with diabetic chronic kidney disease: Secondary | ICD-10-CM | POA: Diagnosis not present

## 2022-11-16 DIAGNOSIS — E1151 Type 2 diabetes mellitus with diabetic peripheral angiopathy without gangrene: Secondary | ICD-10-CM | POA: Diagnosis not present

## 2022-11-16 DIAGNOSIS — M810 Age-related osteoporosis without current pathological fracture: Secondary | ICD-10-CM | POA: Diagnosis not present

## 2022-11-16 DIAGNOSIS — F0390 Unspecified dementia without behavioral disturbance: Secondary | ICD-10-CM | POA: Diagnosis not present

## 2022-11-16 DIAGNOSIS — R1312 Dysphagia, oropharyngeal phase: Secondary | ICD-10-CM | POA: Diagnosis not present

## 2022-11-16 DIAGNOSIS — R54 Age-related physical debility: Secondary | ICD-10-CM | POA: Diagnosis not present

## 2022-11-16 DIAGNOSIS — L89152 Pressure ulcer of sacral region, stage 2: Secondary | ICD-10-CM | POA: Diagnosis not present

## 2022-11-16 DIAGNOSIS — G9341 Metabolic encephalopathy: Secondary | ICD-10-CM | POA: Diagnosis not present

## 2022-11-16 DIAGNOSIS — E039 Hypothyroidism, unspecified: Secondary | ICD-10-CM | POA: Diagnosis not present

## 2022-11-16 DIAGNOSIS — M3390 Dermatopolymyositis, unspecified, organ involvement unspecified: Secondary | ICD-10-CM | POA: Diagnosis not present

## 2022-11-16 DIAGNOSIS — E11649 Type 2 diabetes mellitus with hypoglycemia without coma: Secondary | ICD-10-CM | POA: Diagnosis not present

## 2022-11-16 DIAGNOSIS — N182 Chronic kidney disease, stage 2 (mild): Secondary | ICD-10-CM | POA: Diagnosis not present

## 2022-11-16 DIAGNOSIS — N390 Urinary tract infection, site not specified: Secondary | ICD-10-CM | POA: Diagnosis not present

## 2022-11-16 DIAGNOSIS — E1159 Type 2 diabetes mellitus with other circulatory complications: Secondary | ICD-10-CM | POA: Diagnosis not present

## 2022-11-16 DIAGNOSIS — N179 Acute kidney failure, unspecified: Secondary | ICD-10-CM | POA: Diagnosis not present

## 2022-11-16 DIAGNOSIS — Z794 Long term (current) use of insulin: Secondary | ICD-10-CM | POA: Diagnosis not present

## 2022-11-16 DIAGNOSIS — D631 Anemia in chronic kidney disease: Secondary | ICD-10-CM | POA: Diagnosis not present

## 2022-11-16 DIAGNOSIS — Z79891 Long term (current) use of opiate analgesic: Secondary | ICD-10-CM | POA: Diagnosis not present

## 2022-11-16 DIAGNOSIS — Z7982 Long term (current) use of aspirin: Secondary | ICD-10-CM | POA: Diagnosis not present

## 2022-11-16 DIAGNOSIS — E440 Moderate protein-calorie malnutrition: Secondary | ICD-10-CM | POA: Diagnosis not present

## 2022-11-16 DIAGNOSIS — L89896 Pressure-induced deep tissue damage of other site: Secondary | ICD-10-CM | POA: Diagnosis not present

## 2022-11-16 DIAGNOSIS — G40909 Epilepsy, unspecified, not intractable, without status epilepticus: Secondary | ICD-10-CM | POA: Diagnosis not present

## 2022-11-16 DIAGNOSIS — J690 Pneumonitis due to inhalation of food and vomit: Secondary | ICD-10-CM | POA: Diagnosis not present

## 2022-11-16 DIAGNOSIS — A419 Sepsis, unspecified organism: Secondary | ICD-10-CM | POA: Diagnosis not present

## 2022-11-17 DIAGNOSIS — N390 Urinary tract infection, site not specified: Secondary | ICD-10-CM | POA: Diagnosis not present

## 2022-11-17 DIAGNOSIS — N182 Chronic kidney disease, stage 2 (mild): Secondary | ICD-10-CM | POA: Diagnosis not present

## 2022-11-17 DIAGNOSIS — E1159 Type 2 diabetes mellitus with other circulatory complications: Secondary | ICD-10-CM | POA: Diagnosis not present

## 2022-11-17 DIAGNOSIS — Z79891 Long term (current) use of opiate analgesic: Secondary | ICD-10-CM | POA: Diagnosis not present

## 2022-11-17 DIAGNOSIS — A419 Sepsis, unspecified organism: Secondary | ICD-10-CM | POA: Diagnosis not present

## 2022-11-17 DIAGNOSIS — E440 Moderate protein-calorie malnutrition: Secondary | ICD-10-CM | POA: Diagnosis not present

## 2022-11-17 DIAGNOSIS — N179 Acute kidney failure, unspecified: Secondary | ICD-10-CM | POA: Diagnosis not present

## 2022-11-17 DIAGNOSIS — Z7982 Long term (current) use of aspirin: Secondary | ICD-10-CM | POA: Diagnosis not present

## 2022-11-17 DIAGNOSIS — L89152 Pressure ulcer of sacral region, stage 2: Secondary | ICD-10-CM | POA: Diagnosis not present

## 2022-11-17 DIAGNOSIS — Z794 Long term (current) use of insulin: Secondary | ICD-10-CM | POA: Diagnosis not present

## 2022-11-17 DIAGNOSIS — R1312 Dysphagia, oropharyngeal phase: Secondary | ICD-10-CM | POA: Diagnosis not present

## 2022-11-17 DIAGNOSIS — F0390 Unspecified dementia without behavioral disturbance: Secondary | ICD-10-CM | POA: Diagnosis not present

## 2022-11-17 DIAGNOSIS — I152 Hypertension secondary to endocrine disorders: Secondary | ICD-10-CM | POA: Diagnosis not present

## 2022-11-17 DIAGNOSIS — E11649 Type 2 diabetes mellitus with hypoglycemia without coma: Secondary | ICD-10-CM | POA: Diagnosis not present

## 2022-11-17 DIAGNOSIS — J690 Pneumonitis due to inhalation of food and vomit: Secondary | ICD-10-CM | POA: Diagnosis not present

## 2022-11-17 DIAGNOSIS — G40909 Epilepsy, unspecified, not intractable, without status epilepticus: Secondary | ICD-10-CM | POA: Diagnosis not present

## 2022-11-17 DIAGNOSIS — G9341 Metabolic encephalopathy: Secondary | ICD-10-CM | POA: Diagnosis not present

## 2022-11-17 DIAGNOSIS — D631 Anemia in chronic kidney disease: Secondary | ICD-10-CM | POA: Diagnosis not present

## 2022-11-17 DIAGNOSIS — E039 Hypothyroidism, unspecified: Secondary | ICD-10-CM | POA: Diagnosis not present

## 2022-11-17 DIAGNOSIS — E1122 Type 2 diabetes mellitus with diabetic chronic kidney disease: Secondary | ICD-10-CM | POA: Diagnosis not present

## 2022-11-17 DIAGNOSIS — E1151 Type 2 diabetes mellitus with diabetic peripheral angiopathy without gangrene: Secondary | ICD-10-CM | POA: Diagnosis not present

## 2022-11-17 DIAGNOSIS — L89896 Pressure-induced deep tissue damage of other site: Secondary | ICD-10-CM | POA: Diagnosis not present

## 2022-11-19 DIAGNOSIS — J690 Pneumonitis due to inhalation of food and vomit: Secondary | ICD-10-CM | POA: Diagnosis not present

## 2022-11-19 DIAGNOSIS — L97512 Non-pressure chronic ulcer of other part of right foot with fat layer exposed: Secondary | ICD-10-CM | POA: Diagnosis not present

## 2022-11-19 DIAGNOSIS — L89152 Pressure ulcer of sacral region, stage 2: Secondary | ICD-10-CM | POA: Diagnosis not present

## 2022-11-19 DIAGNOSIS — E440 Moderate protein-calorie malnutrition: Secondary | ICD-10-CM | POA: Diagnosis not present

## 2022-11-19 DIAGNOSIS — E11649 Type 2 diabetes mellitus with hypoglycemia without coma: Secondary | ICD-10-CM | POA: Diagnosis not present

## 2022-11-19 DIAGNOSIS — E1122 Type 2 diabetes mellitus with diabetic chronic kidney disease: Secondary | ICD-10-CM | POA: Diagnosis not present

## 2022-11-19 DIAGNOSIS — R1312 Dysphagia, oropharyngeal phase: Secondary | ICD-10-CM | POA: Diagnosis not present

## 2022-11-19 DIAGNOSIS — Z79891 Long term (current) use of opiate analgesic: Secondary | ICD-10-CM | POA: Diagnosis not present

## 2022-11-19 DIAGNOSIS — E1159 Type 2 diabetes mellitus with other circulatory complications: Secondary | ICD-10-CM | POA: Diagnosis not present

## 2022-11-19 DIAGNOSIS — N390 Urinary tract infection, site not specified: Secondary | ICD-10-CM | POA: Diagnosis not present

## 2022-11-19 DIAGNOSIS — Z794 Long term (current) use of insulin: Secondary | ICD-10-CM | POA: Diagnosis not present

## 2022-11-19 DIAGNOSIS — D631 Anemia in chronic kidney disease: Secondary | ICD-10-CM | POA: Diagnosis not present

## 2022-11-19 DIAGNOSIS — L89896 Pressure-induced deep tissue damage of other site: Secondary | ICD-10-CM | POA: Diagnosis not present

## 2022-11-19 DIAGNOSIS — Z7982 Long term (current) use of aspirin: Secondary | ICD-10-CM | POA: Diagnosis not present

## 2022-11-19 DIAGNOSIS — G9341 Metabolic encephalopathy: Secondary | ICD-10-CM | POA: Diagnosis not present

## 2022-11-19 DIAGNOSIS — N182 Chronic kidney disease, stage 2 (mild): Secondary | ICD-10-CM | POA: Diagnosis not present

## 2022-11-19 DIAGNOSIS — I152 Hypertension secondary to endocrine disorders: Secondary | ICD-10-CM | POA: Diagnosis not present

## 2022-11-19 DIAGNOSIS — N179 Acute kidney failure, unspecified: Secondary | ICD-10-CM | POA: Diagnosis not present

## 2022-11-19 DIAGNOSIS — L97522 Non-pressure chronic ulcer of other part of left foot with fat layer exposed: Secondary | ICD-10-CM | POA: Diagnosis not present

## 2022-11-19 DIAGNOSIS — A419 Sepsis, unspecified organism: Secondary | ICD-10-CM | POA: Diagnosis not present

## 2022-11-19 DIAGNOSIS — E039 Hypothyroidism, unspecified: Secondary | ICD-10-CM | POA: Diagnosis not present

## 2022-11-19 DIAGNOSIS — F0390 Unspecified dementia without behavioral disturbance: Secondary | ICD-10-CM | POA: Diagnosis not present

## 2022-11-19 DIAGNOSIS — E1151 Type 2 diabetes mellitus with diabetic peripheral angiopathy without gangrene: Secondary | ICD-10-CM | POA: Diagnosis not present

## 2022-11-19 DIAGNOSIS — G40909 Epilepsy, unspecified, not intractable, without status epilepticus: Secondary | ICD-10-CM | POA: Diagnosis not present

## 2022-11-20 DIAGNOSIS — D631 Anemia in chronic kidney disease: Secondary | ICD-10-CM | POA: Diagnosis not present

## 2022-11-20 DIAGNOSIS — E440 Moderate protein-calorie malnutrition: Secondary | ICD-10-CM | POA: Diagnosis not present

## 2022-11-20 DIAGNOSIS — I152 Hypertension secondary to endocrine disorders: Secondary | ICD-10-CM | POA: Diagnosis not present

## 2022-11-20 DIAGNOSIS — G9341 Metabolic encephalopathy: Secondary | ICD-10-CM | POA: Diagnosis not present

## 2022-11-20 DIAGNOSIS — J690 Pneumonitis due to inhalation of food and vomit: Secondary | ICD-10-CM | POA: Diagnosis not present

## 2022-11-20 DIAGNOSIS — E1159 Type 2 diabetes mellitus with other circulatory complications: Secondary | ICD-10-CM | POA: Diagnosis not present

## 2022-11-20 DIAGNOSIS — N390 Urinary tract infection, site not specified: Secondary | ICD-10-CM | POA: Diagnosis not present

## 2022-11-20 DIAGNOSIS — A419 Sepsis, unspecified organism: Secondary | ICD-10-CM | POA: Diagnosis not present

## 2022-11-20 DIAGNOSIS — L89152 Pressure ulcer of sacral region, stage 2: Secondary | ICD-10-CM | POA: Diagnosis not present

## 2022-11-20 DIAGNOSIS — E039 Hypothyroidism, unspecified: Secondary | ICD-10-CM | POA: Diagnosis not present

## 2022-11-20 DIAGNOSIS — N182 Chronic kidney disease, stage 2 (mild): Secondary | ICD-10-CM | POA: Diagnosis not present

## 2022-11-20 DIAGNOSIS — R1312 Dysphagia, oropharyngeal phase: Secondary | ICD-10-CM | POA: Diagnosis not present

## 2022-11-20 DIAGNOSIS — E11649 Type 2 diabetes mellitus with hypoglycemia without coma: Secondary | ICD-10-CM | POA: Diagnosis not present

## 2022-11-20 DIAGNOSIS — Z794 Long term (current) use of insulin: Secondary | ICD-10-CM | POA: Diagnosis not present

## 2022-11-20 DIAGNOSIS — Z79891 Long term (current) use of opiate analgesic: Secondary | ICD-10-CM | POA: Diagnosis not present

## 2022-11-20 DIAGNOSIS — G40909 Epilepsy, unspecified, not intractable, without status epilepticus: Secondary | ICD-10-CM | POA: Diagnosis not present

## 2022-11-20 DIAGNOSIS — N179 Acute kidney failure, unspecified: Secondary | ICD-10-CM | POA: Diagnosis not present

## 2022-11-20 DIAGNOSIS — F0390 Unspecified dementia without behavioral disturbance: Secondary | ICD-10-CM | POA: Diagnosis not present

## 2022-11-20 DIAGNOSIS — L89896 Pressure-induced deep tissue damage of other site: Secondary | ICD-10-CM | POA: Diagnosis not present

## 2022-11-20 DIAGNOSIS — E1151 Type 2 diabetes mellitus with diabetic peripheral angiopathy without gangrene: Secondary | ICD-10-CM | POA: Diagnosis not present

## 2022-11-20 DIAGNOSIS — E1122 Type 2 diabetes mellitus with diabetic chronic kidney disease: Secondary | ICD-10-CM | POA: Diagnosis not present

## 2022-11-20 DIAGNOSIS — Z7982 Long term (current) use of aspirin: Secondary | ICD-10-CM | POA: Diagnosis not present

## 2022-11-23 DIAGNOSIS — L89152 Pressure ulcer of sacral region, stage 2: Secondary | ICD-10-CM | POA: Diagnosis not present

## 2022-11-23 DIAGNOSIS — I152 Hypertension secondary to endocrine disorders: Secondary | ICD-10-CM | POA: Diagnosis not present

## 2022-11-23 DIAGNOSIS — E440 Moderate protein-calorie malnutrition: Secondary | ICD-10-CM | POA: Diagnosis not present

## 2022-11-23 DIAGNOSIS — N179 Acute kidney failure, unspecified: Secondary | ICD-10-CM | POA: Diagnosis not present

## 2022-11-23 DIAGNOSIS — N182 Chronic kidney disease, stage 2 (mild): Secondary | ICD-10-CM | POA: Diagnosis not present

## 2022-11-23 DIAGNOSIS — E1159 Type 2 diabetes mellitus with other circulatory complications: Secondary | ICD-10-CM | POA: Diagnosis not present

## 2022-11-23 DIAGNOSIS — D631 Anemia in chronic kidney disease: Secondary | ICD-10-CM | POA: Diagnosis not present

## 2022-11-23 DIAGNOSIS — E1122 Type 2 diabetes mellitus with diabetic chronic kidney disease: Secondary | ICD-10-CM | POA: Diagnosis not present

## 2022-11-23 DIAGNOSIS — L89896 Pressure-induced deep tissue damage of other site: Secondary | ICD-10-CM | POA: Diagnosis not present

## 2022-11-23 DIAGNOSIS — F0390 Unspecified dementia without behavioral disturbance: Secondary | ICD-10-CM | POA: Diagnosis not present

## 2022-11-24 DIAGNOSIS — L89896 Pressure-induced deep tissue damage of other site: Secondary | ICD-10-CM | POA: Diagnosis not present

## 2022-11-24 DIAGNOSIS — I152 Hypertension secondary to endocrine disorders: Secondary | ICD-10-CM | POA: Diagnosis not present

## 2022-11-24 DIAGNOSIS — F0390 Unspecified dementia without behavioral disturbance: Secondary | ICD-10-CM | POA: Diagnosis not present

## 2022-11-24 DIAGNOSIS — N182 Chronic kidney disease, stage 2 (mild): Secondary | ICD-10-CM | POA: Diagnosis not present

## 2022-11-24 DIAGNOSIS — E440 Moderate protein-calorie malnutrition: Secondary | ICD-10-CM | POA: Diagnosis not present

## 2022-11-24 DIAGNOSIS — E1122 Type 2 diabetes mellitus with diabetic chronic kidney disease: Secondary | ICD-10-CM | POA: Diagnosis not present

## 2022-11-24 DIAGNOSIS — E1159 Type 2 diabetes mellitus with other circulatory complications: Secondary | ICD-10-CM | POA: Diagnosis not present

## 2022-11-24 DIAGNOSIS — N179 Acute kidney failure, unspecified: Secondary | ICD-10-CM | POA: Diagnosis not present

## 2022-11-24 DIAGNOSIS — L89152 Pressure ulcer of sacral region, stage 2: Secondary | ICD-10-CM | POA: Diagnosis not present

## 2022-11-24 DIAGNOSIS — D631 Anemia in chronic kidney disease: Secondary | ICD-10-CM | POA: Diagnosis not present

## 2022-11-25 DIAGNOSIS — E440 Moderate protein-calorie malnutrition: Secondary | ICD-10-CM | POA: Diagnosis not present

## 2022-11-25 DIAGNOSIS — E1159 Type 2 diabetes mellitus with other circulatory complications: Secondary | ICD-10-CM | POA: Diagnosis not present

## 2022-11-25 DIAGNOSIS — D631 Anemia in chronic kidney disease: Secondary | ICD-10-CM | POA: Diagnosis not present

## 2022-11-25 DIAGNOSIS — I152 Hypertension secondary to endocrine disorders: Secondary | ICD-10-CM | POA: Diagnosis not present

## 2022-11-25 DIAGNOSIS — E1122 Type 2 diabetes mellitus with diabetic chronic kidney disease: Secondary | ICD-10-CM | POA: Diagnosis not present

## 2022-11-25 DIAGNOSIS — F0390 Unspecified dementia without behavioral disturbance: Secondary | ICD-10-CM | POA: Diagnosis not present

## 2022-11-25 DIAGNOSIS — N182 Chronic kidney disease, stage 2 (mild): Secondary | ICD-10-CM | POA: Diagnosis not present

## 2022-11-25 DIAGNOSIS — N179 Acute kidney failure, unspecified: Secondary | ICD-10-CM | POA: Diagnosis not present

## 2022-11-25 DIAGNOSIS — L89896 Pressure-induced deep tissue damage of other site: Secondary | ICD-10-CM | POA: Diagnosis not present

## 2022-11-25 DIAGNOSIS — L89152 Pressure ulcer of sacral region, stage 2: Secondary | ICD-10-CM | POA: Diagnosis not present

## 2022-11-27 DIAGNOSIS — D631 Anemia in chronic kidney disease: Secondary | ICD-10-CM | POA: Diagnosis not present

## 2022-11-27 DIAGNOSIS — N182 Chronic kidney disease, stage 2 (mild): Secondary | ICD-10-CM | POA: Diagnosis not present

## 2022-11-27 DIAGNOSIS — E440 Moderate protein-calorie malnutrition: Secondary | ICD-10-CM | POA: Diagnosis not present

## 2022-11-27 DIAGNOSIS — N179 Acute kidney failure, unspecified: Secondary | ICD-10-CM | POA: Diagnosis not present

## 2022-11-27 DIAGNOSIS — L89152 Pressure ulcer of sacral region, stage 2: Secondary | ICD-10-CM | POA: Diagnosis not present

## 2022-11-27 DIAGNOSIS — E1122 Type 2 diabetes mellitus with diabetic chronic kidney disease: Secondary | ICD-10-CM | POA: Diagnosis not present

## 2022-11-27 DIAGNOSIS — E1159 Type 2 diabetes mellitus with other circulatory complications: Secondary | ICD-10-CM | POA: Diagnosis not present

## 2022-11-27 DIAGNOSIS — F0390 Unspecified dementia without behavioral disturbance: Secondary | ICD-10-CM | POA: Diagnosis not present

## 2022-11-27 DIAGNOSIS — I152 Hypertension secondary to endocrine disorders: Secondary | ICD-10-CM | POA: Diagnosis not present

## 2022-11-27 DIAGNOSIS — L89896 Pressure-induced deep tissue damage of other site: Secondary | ICD-10-CM | POA: Diagnosis not present

## 2022-11-30 ENCOUNTER — Ambulatory Visit: Payer: Medicare HMO

## 2022-11-30 DIAGNOSIS — E440 Moderate protein-calorie malnutrition: Secondary | ICD-10-CM | POA: Diagnosis not present

## 2022-11-30 DIAGNOSIS — N182 Chronic kidney disease, stage 2 (mild): Secondary | ICD-10-CM | POA: Diagnosis not present

## 2022-11-30 DIAGNOSIS — E1159 Type 2 diabetes mellitus with other circulatory complications: Secondary | ICD-10-CM | POA: Diagnosis not present

## 2022-11-30 DIAGNOSIS — M3390 Dermatopolymyositis, unspecified, organ involvement unspecified: Secondary | ICD-10-CM | POA: Diagnosis not present

## 2022-11-30 DIAGNOSIS — F0390 Unspecified dementia without behavioral disturbance: Secondary | ICD-10-CM | POA: Diagnosis not present

## 2022-11-30 DIAGNOSIS — I152 Hypertension secondary to endocrine disorders: Secondary | ICD-10-CM | POA: Diagnosis not present

## 2022-11-30 DIAGNOSIS — L89896 Pressure-induced deep tissue damage of other site: Secondary | ICD-10-CM | POA: Diagnosis not present

## 2022-11-30 DIAGNOSIS — L89152 Pressure ulcer of sacral region, stage 2: Secondary | ICD-10-CM | POA: Diagnosis not present

## 2022-11-30 DIAGNOSIS — N179 Acute kidney failure, unspecified: Secondary | ICD-10-CM | POA: Diagnosis not present

## 2022-11-30 DIAGNOSIS — D631 Anemia in chronic kidney disease: Secondary | ICD-10-CM | POA: Diagnosis not present

## 2022-11-30 DIAGNOSIS — E1122 Type 2 diabetes mellitus with diabetic chronic kidney disease: Secondary | ICD-10-CM | POA: Diagnosis not present

## 2022-12-01 DIAGNOSIS — N179 Acute kidney failure, unspecified: Secondary | ICD-10-CM | POA: Diagnosis not present

## 2022-12-01 DIAGNOSIS — L89152 Pressure ulcer of sacral region, stage 2: Secondary | ICD-10-CM | POA: Diagnosis not present

## 2022-12-01 DIAGNOSIS — I152 Hypertension secondary to endocrine disorders: Secondary | ICD-10-CM | POA: Diagnosis not present

## 2022-12-01 DIAGNOSIS — E1122 Type 2 diabetes mellitus with diabetic chronic kidney disease: Secondary | ICD-10-CM | POA: Diagnosis not present

## 2022-12-01 DIAGNOSIS — L89896 Pressure-induced deep tissue damage of other site: Secondary | ICD-10-CM | POA: Diagnosis not present

## 2022-12-01 DIAGNOSIS — E440 Moderate protein-calorie malnutrition: Secondary | ICD-10-CM | POA: Diagnosis not present

## 2022-12-01 DIAGNOSIS — E1159 Type 2 diabetes mellitus with other circulatory complications: Secondary | ICD-10-CM | POA: Diagnosis not present

## 2022-12-01 DIAGNOSIS — N182 Chronic kidney disease, stage 2 (mild): Secondary | ICD-10-CM | POA: Diagnosis not present

## 2022-12-01 DIAGNOSIS — F0390 Unspecified dementia without behavioral disturbance: Secondary | ICD-10-CM | POA: Diagnosis not present

## 2022-12-01 DIAGNOSIS — D631 Anemia in chronic kidney disease: Secondary | ICD-10-CM | POA: Diagnosis not present

## 2022-12-03 DIAGNOSIS — N179 Acute kidney failure, unspecified: Secondary | ICD-10-CM | POA: Diagnosis not present

## 2022-12-03 DIAGNOSIS — N182 Chronic kidney disease, stage 2 (mild): Secondary | ICD-10-CM | POA: Diagnosis not present

## 2022-12-03 DIAGNOSIS — D631 Anemia in chronic kidney disease: Secondary | ICD-10-CM | POA: Diagnosis not present

## 2022-12-03 DIAGNOSIS — E1159 Type 2 diabetes mellitus with other circulatory complications: Secondary | ICD-10-CM | POA: Diagnosis not present

## 2022-12-03 DIAGNOSIS — L89896 Pressure-induced deep tissue damage of other site: Secondary | ICD-10-CM | POA: Diagnosis not present

## 2022-12-03 DIAGNOSIS — F0390 Unspecified dementia without behavioral disturbance: Secondary | ICD-10-CM | POA: Diagnosis not present

## 2022-12-03 DIAGNOSIS — L89152 Pressure ulcer of sacral region, stage 2: Secondary | ICD-10-CM | POA: Diagnosis not present

## 2022-12-03 DIAGNOSIS — I152 Hypertension secondary to endocrine disorders: Secondary | ICD-10-CM | POA: Diagnosis not present

## 2022-12-03 DIAGNOSIS — E440 Moderate protein-calorie malnutrition: Secondary | ICD-10-CM | POA: Diagnosis not present

## 2022-12-03 DIAGNOSIS — E1122 Type 2 diabetes mellitus with diabetic chronic kidney disease: Secondary | ICD-10-CM | POA: Diagnosis not present

## 2022-12-04 DIAGNOSIS — N179 Acute kidney failure, unspecified: Secondary | ICD-10-CM | POA: Diagnosis not present

## 2022-12-04 DIAGNOSIS — E1159 Type 2 diabetes mellitus with other circulatory complications: Secondary | ICD-10-CM | POA: Diagnosis not present

## 2022-12-04 DIAGNOSIS — E440 Moderate protein-calorie malnutrition: Secondary | ICD-10-CM | POA: Diagnosis not present

## 2022-12-04 DIAGNOSIS — N182 Chronic kidney disease, stage 2 (mild): Secondary | ICD-10-CM | POA: Diagnosis not present

## 2022-12-04 DIAGNOSIS — I152 Hypertension secondary to endocrine disorders: Secondary | ICD-10-CM | POA: Diagnosis not present

## 2022-12-04 DIAGNOSIS — F0390 Unspecified dementia without behavioral disturbance: Secondary | ICD-10-CM | POA: Diagnosis not present

## 2022-12-04 DIAGNOSIS — L89152 Pressure ulcer of sacral region, stage 2: Secondary | ICD-10-CM | POA: Diagnosis not present

## 2022-12-04 DIAGNOSIS — L89896 Pressure-induced deep tissue damage of other site: Secondary | ICD-10-CM | POA: Diagnosis not present

## 2022-12-04 DIAGNOSIS — E1122 Type 2 diabetes mellitus with diabetic chronic kidney disease: Secondary | ICD-10-CM | POA: Diagnosis not present

## 2022-12-04 DIAGNOSIS — D631 Anemia in chronic kidney disease: Secondary | ICD-10-CM | POA: Diagnosis not present

## 2022-12-07 DIAGNOSIS — E119 Type 2 diabetes mellitus without complications: Secondary | ICD-10-CM | POA: Diagnosis not present

## 2022-12-07 DIAGNOSIS — E1165 Type 2 diabetes mellitus with hyperglycemia: Secondary | ICD-10-CM | POA: Diagnosis not present

## 2022-12-07 DIAGNOSIS — Z794 Long term (current) use of insulin: Secondary | ICD-10-CM | POA: Diagnosis not present

## 2022-12-08 DIAGNOSIS — N179 Acute kidney failure, unspecified: Secondary | ICD-10-CM | POA: Diagnosis not present

## 2022-12-08 DIAGNOSIS — F0390 Unspecified dementia without behavioral disturbance: Secondary | ICD-10-CM | POA: Diagnosis not present

## 2022-12-08 DIAGNOSIS — L89152 Pressure ulcer of sacral region, stage 2: Secondary | ICD-10-CM | POA: Diagnosis not present

## 2022-12-08 DIAGNOSIS — E1159 Type 2 diabetes mellitus with other circulatory complications: Secondary | ICD-10-CM | POA: Diagnosis not present

## 2022-12-08 DIAGNOSIS — E440 Moderate protein-calorie malnutrition: Secondary | ICD-10-CM | POA: Diagnosis not present

## 2022-12-08 DIAGNOSIS — I152 Hypertension secondary to endocrine disorders: Secondary | ICD-10-CM | POA: Diagnosis not present

## 2022-12-08 DIAGNOSIS — D631 Anemia in chronic kidney disease: Secondary | ICD-10-CM | POA: Diagnosis not present

## 2022-12-08 DIAGNOSIS — E1122 Type 2 diabetes mellitus with diabetic chronic kidney disease: Secondary | ICD-10-CM | POA: Diagnosis not present

## 2022-12-08 DIAGNOSIS — L89896 Pressure-induced deep tissue damage of other site: Secondary | ICD-10-CM | POA: Diagnosis not present

## 2022-12-08 DIAGNOSIS — N182 Chronic kidney disease, stage 2 (mild): Secondary | ICD-10-CM | POA: Diagnosis not present

## 2022-12-10 ENCOUNTER — Encounter (HOSPITAL_BASED_OUTPATIENT_CLINIC_OR_DEPARTMENT_OTHER): Payer: Medicare HMO | Attending: Internal Medicine | Admitting: Internal Medicine

## 2022-12-10 ENCOUNTER — Institutional Professional Consult (permissible substitution): Payer: Medicare HMO | Admitting: Diagnostic Neuroimaging

## 2022-12-10 DIAGNOSIS — L97518 Non-pressure chronic ulcer of other part of right foot with other specified severity: Secondary | ICD-10-CM | POA: Insufficient documentation

## 2022-12-10 DIAGNOSIS — E1122 Type 2 diabetes mellitus with diabetic chronic kidney disease: Secondary | ICD-10-CM | POA: Insufficient documentation

## 2022-12-10 DIAGNOSIS — E785 Hyperlipidemia, unspecified: Secondary | ICD-10-CM | POA: Insufficient documentation

## 2022-12-10 DIAGNOSIS — F0390 Unspecified dementia without behavioral disturbance: Secondary | ICD-10-CM | POA: Diagnosis not present

## 2022-12-10 DIAGNOSIS — E1151 Type 2 diabetes mellitus with diabetic peripheral angiopathy without gangrene: Secondary | ICD-10-CM | POA: Insufficient documentation

## 2022-12-10 DIAGNOSIS — L97528 Non-pressure chronic ulcer of other part of left foot with other specified severity: Secondary | ICD-10-CM | POA: Diagnosis not present

## 2022-12-10 DIAGNOSIS — N182 Chronic kidney disease, stage 2 (mild): Secondary | ICD-10-CM | POA: Diagnosis not present

## 2022-12-10 DIAGNOSIS — K219 Gastro-esophageal reflux disease without esophagitis: Secondary | ICD-10-CM | POA: Insufficient documentation

## 2022-12-10 DIAGNOSIS — I129 Hypertensive chronic kidney disease with stage 1 through stage 4 chronic kidney disease, or unspecified chronic kidney disease: Secondary | ICD-10-CM | POA: Diagnosis not present

## 2022-12-10 DIAGNOSIS — E039 Hypothyroidism, unspecified: Secondary | ICD-10-CM | POA: Insufficient documentation

## 2022-12-10 DIAGNOSIS — I739 Peripheral vascular disease, unspecified: Secondary | ICD-10-CM | POA: Diagnosis not present

## 2022-12-10 DIAGNOSIS — G40909 Epilepsy, unspecified, not intractable, without status epilepticus: Secondary | ICD-10-CM | POA: Insufficient documentation

## 2022-12-10 DIAGNOSIS — E11621 Type 2 diabetes mellitus with foot ulcer: Secondary | ICD-10-CM | POA: Diagnosis not present

## 2022-12-11 DIAGNOSIS — D631 Anemia in chronic kidney disease: Secondary | ICD-10-CM | POA: Diagnosis not present

## 2022-12-11 DIAGNOSIS — L89152 Pressure ulcer of sacral region, stage 2: Secondary | ICD-10-CM | POA: Diagnosis not present

## 2022-12-11 DIAGNOSIS — N179 Acute kidney failure, unspecified: Secondary | ICD-10-CM | POA: Diagnosis not present

## 2022-12-11 DIAGNOSIS — F0390 Unspecified dementia without behavioral disturbance: Secondary | ICD-10-CM | POA: Diagnosis not present

## 2022-12-11 DIAGNOSIS — I152 Hypertension secondary to endocrine disorders: Secondary | ICD-10-CM | POA: Diagnosis not present

## 2022-12-11 DIAGNOSIS — E1122 Type 2 diabetes mellitus with diabetic chronic kidney disease: Secondary | ICD-10-CM | POA: Diagnosis not present

## 2022-12-11 DIAGNOSIS — E440 Moderate protein-calorie malnutrition: Secondary | ICD-10-CM | POA: Diagnosis not present

## 2022-12-11 DIAGNOSIS — L89896 Pressure-induced deep tissue damage of other site: Secondary | ICD-10-CM | POA: Diagnosis not present

## 2022-12-11 DIAGNOSIS — N182 Chronic kidney disease, stage 2 (mild): Secondary | ICD-10-CM | POA: Diagnosis not present

## 2022-12-11 DIAGNOSIS — E1159 Type 2 diabetes mellitus with other circulatory complications: Secondary | ICD-10-CM | POA: Diagnosis not present

## 2022-12-11 NOTE — Progress Notes (Signed)
NATUSHA, ALLISTON ([id number]) [phone] [id number] Page 1 of 4 Visit Report for 12/10/2022 Abuse Risk Screen Details Patient Name: Date of Service: [name] [name], [name] [name] 12/10/2022 8:00 A M Medical Record Number: [id number] Patient Account Number: [id number] Date of Birth/Sex: Treating RN: 12/23/1933 (89 y.o. Katrinka Blazing Primary Care [name] [name]: Hillard Danker Other Clinician: Referring Demario Faniel: Treating Percell Lamboy/Extender: Kizzie Furnish Weeks in Treatment: 0 Abuse Risk Screen Items Answer ABUSE RISK SCREEN: Has anyone close to you tried to hurt or harm you recentlyo No Do you feel uncomfortable with anyone in your familyo No Has anyone forced you do things that you didnt want to doo No Electronic Signature(s) Signed: 12/10/2022 6:34:43 PM By: Karie Schwalbe RN Entered By: Karie Schwalbe on 12/10/2022 08:31:08 -------------------------------------------------------------------------------- Activities of Daily Living Details Patient Name: Date of Service: [name] [name], [name] [name] 12/10/2022 8:00 A M Medical Record Number: [id number] Patient Account Number: [id number] Date of Birth/Sex: Treating RN: Jan 22, 1934 (89 y.o. Katrinka Blazing Primary Care Jabreel Chimento: Hillard Danker Other Clinician: Referring Danny Yackley: Treating Amanii Snethen/Extender: Kizzie Furnish Weeks in Treatment: 0 Activities of Daily Living Items Answer Activities of Daily Living (Please select one for each item) Drive Automobile Not Able T Medications ake Need Assistance Use T elephone Need Assistance Care for Appearance Need Assistance Use T oilet Need Assistance Bath / Shower Need Assistance Dress Self Need Assistance Feed Self Need Assistance Walk Need Assistance Get In / Out Bed Need Assistance Housework Not Able Prepare Meals Need Assistance Handle Money Not Able Shop for Self Not Able Electronic Signature(s) Signed: 12/10/2022 6:34:43 PM By: Karie Schwalbe  RN Entered By: Karie Schwalbe on 12/10/2022 08:31:48 -------------------------------------------------------------------------------- Education Screening Details Patient Name: Date of Service: [name] Spruce M. 12/10/2022 8:00 A M Medical Record Number: [id number] Patient Account Number: [id number] Date of Birth/Sex: Treating RN: 17-Nov-1933 (89 y.o. Katrinka Blazing Primary Care Ross Bender: Hillard Danker Other Clinician: Referring Wandalene Abrams: Treating Bryan Goin/Extender: Kizzie Furnish Weeks in Treatment: 0 WINNA, VOLLE ([id number]) [id number] Page 2 of 4 Primary Learner Assessed: Caregiver Reason Patient is not Primary Learner: Has a Hx: Dementia Learning Preferences/Education Level/Primary Language Learning Preference: Explanation, Demonstration, Printed Material Highest Education Level: High School Preferred Language: Economist Language Barrier: No Translator Needed: No Memory Deficit: No Emotional Barrier: No Cultural/Religious Beliefs Affecting Medical Care: No Physical Barrier Impaired Vision: No Impaired Hearing: Yes Hard of hearing -Bilateral ears Decreased Hand dexterity: No Knowledge/Comprehension Knowledge Level: High Comprehension Level: High Ability to understand written instructions: High Ability to understand verbal instructions: High Motivation Anxiety Level: Calm Cooperation: Cooperative Education Importance: Acknowledges Need Interest in Health Problems: Asks Questions Perception: Coherent Willingness to Engage in Self-Management High Activities: Readiness to Engage in Self-Management High Activities: Electronic Signature(s) Signed: 12/10/2022 6:34:43 PM By: Karie Schwalbe RN Entered By: Karie Schwalbe on 12/10/2022 08:33:09 -------------------------------------------------------------------------------- Fall Risk Assessment Details Patient Name: Date of Service: [name] [name], [name] M.  12/10/2022 8:00 A M Medical Record Number: [id number] Patient Account Number: [id number] Date of Birth/Sex: Treating RN: [birth year] (89 y.o. Katrinka Blazing Primary Care Ashlye Oviedo: Hillard Danker Other Clinician: Referring Ellisha Bankson: Treating Marry Kusch/Extender: Kizzie Furnish Weeks in Treatment: 0 Fall Risk Assessment Items Have you had 2 or more falls in the last 12 monthso 0 No Have you had any fall that resulted in injury in the last 12 monthso 0 No FALLS RISK SCREEN History of falling - immediate or within 3 months 0 No Secondary diagnosis (Do you have 2 or more medical  diagnoseso) 0 No Ambulatory aid None/bed rest/wheelchair/nurse 0 No Crutches/cane/walker 0 No Furniture 0 No Intravenous therapy Access/Saline/Heparin Lock 0 No Gait/Transferring Normal/ bed rest/ wheelchair 0 No Weak (short steps with or without shuffle, stooped but able to lift head while walking, may seek 0 No support from furniture) Impaired (short steps with shuffle, may have difficulty arising from chair, head down, impaired 0 No balance) Mental Status Oriented to own ability 0 No Overestimates or forgets limitations 0 No Electronic Signature(s) Signed: 12/10/2022 6:34:43 PM By: Karie Schwalbe RN Entered By: Karie Schwalbe on 12/10/2022 08:33:44 -------------------------------------------------------------------------------- Foot Assessment Details Patient Name: Date of Service: [name] Spruce M. 12/10/2022 8:00 A M Medical Record Number: [id number] Patient Account Number: [id number] Date of Birth/Sex: Treating RN: 08-19-1933 (89 y.o. Katrinka Blazing Primary Care Crissie Aloi: Hillard Danker Other Clinician: Referring Tamra Koos: Treating Markea Ruzich/Extender: Kizzie Furnish Weeks in Treatment: 0 Foot Assessment Items Site Locations + = Sensation present, - = Sensation absent, C = Callus, U = Ulcer R = Redness, W = Warmth, M = Maceration, PU = Pre-ulcerative lesion F = Fissure, S  = Swelling, D = Dryness Assessment Right: Left: Other Deformity: No No Prior Foot Ulcer: No No Prior Amputation: No No Charcot Joint: No No Ambulatory Status: Ambulatory With Help Assistance Device: Wheelchair Gait: Steady Electronic Signature(s) Signed: 12/10/2022 6:34:43 PM By: Karie Schwalbe RN Entered By: Karie Schwalbe on 12/10/2022 08:36:59 -------------------------------------------------------------------------------- Nutrition Risk Screening Details Patient Name: Date of Service: [name] [name], [name] [name] 12/10/2022 8:00 A M Medical Record Number: [id number] Patient Account Number: [id number] Date of Birth/Sex: Treating RN: 06-May-1934 (89 y.o. Katrinka Blazing Primary Care Bernell Haynie: Hillard Danker Other Clinician: Referring Lakaisha Danish: Treating Ghadeer Kastelic/Extender: Kizzie Furnish Weeks in Treatment: 0 Height (in): 66 Weight (lbs): 110 Body Mass Index (BMI): 17.8 [name], Hind M ([id number]) [id number] Page 4 of 4 Nutrition Risk Screening Items Score Screening NUTRITION RISK SCREEN: I have an illness or condition that made me change the kind and/or amount of food I eat 0 No I eat fewer than two meals per day 0 No I eat few fruits and vegetables, or milk products 0 No I have three or more drinks of beer, liquor or wine almost every day 0 No I have tooth or mouth problems that make it hard for me to eat 0 No I don't always have enough money to buy the food I need 0 No I eat alone most of the time 0 No I take three or more different prescribed or over-the-counter drugs a day 0 No Without wanting to, I have lost or gained 10 pounds in the last six months 0 No I am not always physically able to shop, cook and/or feed myself 0 No Nutrition Protocols Good Risk Protocol 0 No interventions needed Moderate Risk Protocol High Risk Proctocol Risk Level: Good Risk Score: 0 Electronic Signature(s) Signed: 12/10/2022 6:34:43 PM By: Karie Schwalbe RN Entered By: Karie Schwalbe on 12/10/2022 08:33:54

## 2022-12-15 DIAGNOSIS — E1159 Type 2 diabetes mellitus with other circulatory complications: Secondary | ICD-10-CM | POA: Diagnosis not present

## 2022-12-15 DIAGNOSIS — N179 Acute kidney failure, unspecified: Secondary | ICD-10-CM | POA: Diagnosis not present

## 2022-12-15 DIAGNOSIS — F0390 Unspecified dementia without behavioral disturbance: Secondary | ICD-10-CM | POA: Diagnosis not present

## 2022-12-15 DIAGNOSIS — L89896 Pressure-induced deep tissue damage of other site: Secondary | ICD-10-CM | POA: Diagnosis not present

## 2022-12-15 DIAGNOSIS — D631 Anemia in chronic kidney disease: Secondary | ICD-10-CM | POA: Diagnosis not present

## 2022-12-15 DIAGNOSIS — I152 Hypertension secondary to endocrine disorders: Secondary | ICD-10-CM | POA: Diagnosis not present

## 2022-12-15 DIAGNOSIS — N182 Chronic kidney disease, stage 2 (mild): Secondary | ICD-10-CM | POA: Diagnosis not present

## 2022-12-15 DIAGNOSIS — E440 Moderate protein-calorie malnutrition: Secondary | ICD-10-CM | POA: Diagnosis not present

## 2022-12-15 DIAGNOSIS — E1122 Type 2 diabetes mellitus with diabetic chronic kidney disease: Secondary | ICD-10-CM | POA: Diagnosis not present

## 2022-12-15 DIAGNOSIS — L89152 Pressure ulcer of sacral region, stage 2: Secondary | ICD-10-CM | POA: Diagnosis not present

## 2022-12-16 DIAGNOSIS — M810 Age-related osteoporosis without current pathological fracture: Secondary | ICD-10-CM | POA: Diagnosis not present

## 2022-12-16 DIAGNOSIS — M3390 Dermatopolymyositis, unspecified, organ involvement unspecified: Secondary | ICD-10-CM | POA: Diagnosis not present

## 2022-12-16 DIAGNOSIS — R54 Age-related physical debility: Secondary | ICD-10-CM | POA: Diagnosis not present

## 2022-12-18 ENCOUNTER — Telehealth: Payer: Self-pay | Admitting: Physician Assistant

## 2022-12-18 DIAGNOSIS — N179 Acute kidney failure, unspecified: Secondary | ICD-10-CM | POA: Diagnosis not present

## 2022-12-18 DIAGNOSIS — L89896 Pressure-induced deep tissue damage of other site: Secondary | ICD-10-CM | POA: Diagnosis not present

## 2022-12-18 DIAGNOSIS — N182 Chronic kidney disease, stage 2 (mild): Secondary | ICD-10-CM | POA: Diagnosis not present

## 2022-12-18 DIAGNOSIS — F0390 Unspecified dementia without behavioral disturbance: Secondary | ICD-10-CM | POA: Diagnosis not present

## 2022-12-18 DIAGNOSIS — E1122 Type 2 diabetes mellitus with diabetic chronic kidney disease: Secondary | ICD-10-CM | POA: Diagnosis not present

## 2022-12-18 DIAGNOSIS — L89152 Pressure ulcer of sacral region, stage 2: Secondary | ICD-10-CM | POA: Diagnosis not present

## 2022-12-18 DIAGNOSIS — E440 Moderate protein-calorie malnutrition: Secondary | ICD-10-CM | POA: Diagnosis not present

## 2022-12-18 DIAGNOSIS — E1159 Type 2 diabetes mellitus with other circulatory complications: Secondary | ICD-10-CM | POA: Diagnosis not present

## 2022-12-18 DIAGNOSIS — I152 Hypertension secondary to endocrine disorders: Secondary | ICD-10-CM | POA: Diagnosis not present

## 2022-12-18 DIAGNOSIS — D631 Anemia in chronic kidney disease: Secondary | ICD-10-CM | POA: Diagnosis not present

## 2022-12-18 NOTE — Telephone Encounter (Signed)
[name] called to let office know they received a letter in the mail regarding a device transmission that's supposed to be sent but she stated they've been over this twice already. Service has been discontinued since February and she doesn't want to be charged for anything.She's like a callback to discuss further. Please advise

## 2022-12-22 ENCOUNTER — Encounter (HOSPITAL_BASED_OUTPATIENT_CLINIC_OR_DEPARTMENT_OTHER): Payer: Medicare HMO | Attending: Internal Medicine | Admitting: Internal Medicine

## 2022-12-22 DIAGNOSIS — M1990 Unspecified osteoarthritis, unspecified site: Secondary | ICD-10-CM | POA: Insufficient documentation

## 2022-12-22 DIAGNOSIS — L97518 Non-pressure chronic ulcer of other part of right foot with other specified severity: Secondary | ICD-10-CM | POA: Diagnosis not present

## 2022-12-22 DIAGNOSIS — E11621 Type 2 diabetes mellitus with foot ulcer: Secondary | ICD-10-CM | POA: Diagnosis not present

## 2022-12-22 DIAGNOSIS — Z794 Long term (current) use of insulin: Secondary | ICD-10-CM | POA: Diagnosis not present

## 2022-12-22 DIAGNOSIS — E1122 Type 2 diabetes mellitus with diabetic chronic kidney disease: Secondary | ICD-10-CM | POA: Diagnosis not present

## 2022-12-22 DIAGNOSIS — I129 Hypertensive chronic kidney disease with stage 1 through stage 4 chronic kidney disease, or unspecified chronic kidney disease: Secondary | ICD-10-CM | POA: Diagnosis not present

## 2022-12-22 DIAGNOSIS — E440 Moderate protein-calorie malnutrition: Secondary | ICD-10-CM | POA: Diagnosis not present

## 2022-12-22 DIAGNOSIS — D631 Anemia in chronic kidney disease: Secondary | ICD-10-CM | POA: Diagnosis not present

## 2022-12-22 DIAGNOSIS — N179 Acute kidney failure, unspecified: Secondary | ICD-10-CM | POA: Diagnosis not present

## 2022-12-22 DIAGNOSIS — L97528 Non-pressure chronic ulcer of other part of left foot with other specified severity: Secondary | ICD-10-CM | POA: Insufficient documentation

## 2022-12-22 DIAGNOSIS — E1151 Type 2 diabetes mellitus with diabetic peripheral angiopathy without gangrene: Secondary | ICD-10-CM | POA: Insufficient documentation

## 2022-12-22 DIAGNOSIS — G40909 Epilepsy, unspecified, not intractable, without status epilepticus: Secondary | ICD-10-CM | POA: Insufficient documentation

## 2022-12-22 DIAGNOSIS — F0390 Unspecified dementia without behavioral disturbance: Secondary | ICD-10-CM | POA: Diagnosis not present

## 2022-12-22 DIAGNOSIS — I152 Hypertension secondary to endocrine disorders: Secondary | ICD-10-CM | POA: Diagnosis not present

## 2022-12-22 DIAGNOSIS — E1159 Type 2 diabetes mellitus with other circulatory complications: Secondary | ICD-10-CM | POA: Diagnosis not present

## 2022-12-22 DIAGNOSIS — L97512 Non-pressure chronic ulcer of other part of right foot with fat layer exposed: Secondary | ICD-10-CM | POA: Diagnosis not present

## 2022-12-22 DIAGNOSIS — E785 Hyperlipidemia, unspecified: Secondary | ICD-10-CM | POA: Insufficient documentation

## 2022-12-22 DIAGNOSIS — L89152 Pressure ulcer of sacral region, stage 2: Secondary | ICD-10-CM | POA: Diagnosis not present

## 2022-12-22 DIAGNOSIS — L89896 Pressure-induced deep tissue damage of other site: Secondary | ICD-10-CM | POA: Diagnosis not present

## 2022-12-22 DIAGNOSIS — N182 Chronic kidney disease, stage 2 (mild): Secondary | ICD-10-CM | POA: Diagnosis not present

## 2022-12-22 DIAGNOSIS — N189 Chronic kidney disease, unspecified: Secondary | ICD-10-CM | POA: Insufficient documentation

## 2022-12-22 DIAGNOSIS — E039 Hypothyroidism, unspecified: Secondary | ICD-10-CM | POA: Insufficient documentation

## 2022-12-22 DIAGNOSIS — K219 Gastro-esophageal reflux disease without esophagitis: Secondary | ICD-10-CM | POA: Diagnosis not present

## 2022-12-24 DIAGNOSIS — E1151 Type 2 diabetes mellitus with diabetic peripheral angiopathy without gangrene: Secondary | ICD-10-CM | POA: Diagnosis not present

## 2022-12-24 DIAGNOSIS — I129 Hypertensive chronic kidney disease with stage 1 through stage 4 chronic kidney disease, or unspecified chronic kidney disease: Secondary | ICD-10-CM | POA: Diagnosis not present

## 2022-12-24 DIAGNOSIS — Z1331 Encounter for screening for depression: Secondary | ICD-10-CM | POA: Diagnosis not present

## 2022-12-24 DIAGNOSIS — Z0000 Encounter for general adult medical examination without abnormal findings: Secondary | ICD-10-CM | POA: Diagnosis not present

## 2022-12-24 DIAGNOSIS — E43 Unspecified severe protein-calorie malnutrition: Secondary | ICD-10-CM | POA: Diagnosis not present

## 2022-12-24 DIAGNOSIS — I700 Atherosclerosis of aorta: Secondary | ICD-10-CM | POA: Diagnosis not present

## 2022-12-24 DIAGNOSIS — D649 Anemia, unspecified: Secondary | ICD-10-CM | POA: Diagnosis not present

## 2022-12-24 DIAGNOSIS — L89159 Pressure ulcer of sacral region, unspecified stage: Secondary | ICD-10-CM | POA: Diagnosis not present

## 2022-12-24 DIAGNOSIS — M3390 Dermatopolymyositis, unspecified, organ involvement unspecified: Secondary | ICD-10-CM | POA: Diagnosis not present

## 2022-12-24 DIAGNOSIS — G40909 Epilepsy, unspecified, not intractable, without status epilepticus: Secondary | ICD-10-CM | POA: Diagnosis not present

## 2022-12-24 DIAGNOSIS — E1142 Type 2 diabetes mellitus with diabetic polyneuropathy: Secondary | ICD-10-CM | POA: Diagnosis not present

## 2022-12-24 DIAGNOSIS — E039 Hypothyroidism, unspecified: Secondary | ICD-10-CM | POA: Diagnosis not present

## 2022-12-24 DIAGNOSIS — E871 Hypo-osmolality and hyponatremia: Secondary | ICD-10-CM | POA: Diagnosis not present

## 2022-12-24 DIAGNOSIS — J439 Emphysema, unspecified: Secondary | ICD-10-CM | POA: Diagnosis not present

## 2022-12-29 DIAGNOSIS — L89152 Pressure ulcer of sacral region, stage 2: Secondary | ICD-10-CM | POA: Diagnosis not present

## 2022-12-29 DIAGNOSIS — E1122 Type 2 diabetes mellitus with diabetic chronic kidney disease: Secondary | ICD-10-CM | POA: Diagnosis not present

## 2022-12-29 DIAGNOSIS — E1159 Type 2 diabetes mellitus with other circulatory complications: Secondary | ICD-10-CM | POA: Diagnosis not present

## 2022-12-29 DIAGNOSIS — I152 Hypertension secondary to endocrine disorders: Secondary | ICD-10-CM | POA: Diagnosis not present

## 2022-12-29 DIAGNOSIS — L97518 Non-pressure chronic ulcer of other part of right foot with other specified severity: Secondary | ICD-10-CM | POA: Diagnosis not present

## 2022-12-29 DIAGNOSIS — L97528 Non-pressure chronic ulcer of other part of left foot with other specified severity: Secondary | ICD-10-CM | POA: Diagnosis not present

## 2022-12-29 DIAGNOSIS — D631 Anemia in chronic kidney disease: Secondary | ICD-10-CM | POA: Diagnosis not present

## 2022-12-29 DIAGNOSIS — E11621 Type 2 diabetes mellitus with foot ulcer: Secondary | ICD-10-CM | POA: Diagnosis not present

## 2022-12-29 DIAGNOSIS — L89896 Pressure-induced deep tissue damage of other site: Secondary | ICD-10-CM | POA: Diagnosis not present

## 2022-12-29 DIAGNOSIS — N182 Chronic kidney disease, stage 2 (mild): Secondary | ICD-10-CM | POA: Diagnosis not present

## 2022-12-30 DIAGNOSIS — E1159 Type 2 diabetes mellitus with other circulatory complications: Secondary | ICD-10-CM | POA: Diagnosis not present

## 2022-12-30 DIAGNOSIS — D631 Anemia in chronic kidney disease: Secondary | ICD-10-CM | POA: Diagnosis not present

## 2022-12-30 DIAGNOSIS — E11621 Type 2 diabetes mellitus with foot ulcer: Secondary | ICD-10-CM | POA: Diagnosis not present

## 2022-12-30 DIAGNOSIS — L89896 Pressure-induced deep tissue damage of other site: Secondary | ICD-10-CM | POA: Diagnosis not present

## 2022-12-30 DIAGNOSIS — N182 Chronic kidney disease, stage 2 (mild): Secondary | ICD-10-CM | POA: Diagnosis not present

## 2022-12-30 DIAGNOSIS — L97528 Non-pressure chronic ulcer of other part of left foot with other specified severity: Secondary | ICD-10-CM | POA: Diagnosis not present

## 2022-12-30 DIAGNOSIS — I152 Hypertension secondary to endocrine disorders: Secondary | ICD-10-CM | POA: Diagnosis not present

## 2022-12-30 DIAGNOSIS — E1122 Type 2 diabetes mellitus with diabetic chronic kidney disease: Secondary | ICD-10-CM | POA: Diagnosis not present

## 2022-12-30 DIAGNOSIS — L97518 Non-pressure chronic ulcer of other part of right foot with other specified severity: Secondary | ICD-10-CM | POA: Diagnosis not present

## 2022-12-30 DIAGNOSIS — L89152 Pressure ulcer of sacral region, stage 2: Secondary | ICD-10-CM | POA: Diagnosis not present

## 2022-12-31 ENCOUNTER — Encounter (HOSPITAL_BASED_OUTPATIENT_CLINIC_OR_DEPARTMENT_OTHER): Payer: Medicare HMO | Admitting: Internal Medicine

## 2022-12-31 DIAGNOSIS — N189 Chronic kidney disease, unspecified: Secondary | ICD-10-CM | POA: Diagnosis not present

## 2022-12-31 DIAGNOSIS — E1159 Type 2 diabetes mellitus with other circulatory complications: Secondary | ICD-10-CM | POA: Diagnosis not present

## 2022-12-31 DIAGNOSIS — I739 Peripheral vascular disease, unspecified: Secondary | ICD-10-CM | POA: Diagnosis not present

## 2022-12-31 DIAGNOSIS — L97528 Non-pressure chronic ulcer of other part of left foot with other specified severity: Secondary | ICD-10-CM

## 2022-12-31 DIAGNOSIS — E1122 Type 2 diabetes mellitus with diabetic chronic kidney disease: Secondary | ICD-10-CM | POA: Diagnosis not present

## 2022-12-31 DIAGNOSIS — L89896 Pressure-induced deep tissue damage of other site: Secondary | ICD-10-CM | POA: Diagnosis not present

## 2022-12-31 DIAGNOSIS — E11621 Type 2 diabetes mellitus with foot ulcer: Secondary | ICD-10-CM | POA: Diagnosis not present

## 2022-12-31 DIAGNOSIS — L97518 Non-pressure chronic ulcer of other part of right foot with other specified severity: Secondary | ICD-10-CM

## 2022-12-31 DIAGNOSIS — I152 Hypertension secondary to endocrine disorders: Secondary | ICD-10-CM | POA: Diagnosis not present

## 2022-12-31 DIAGNOSIS — E785 Hyperlipidemia, unspecified: Secondary | ICD-10-CM | POA: Diagnosis not present

## 2022-12-31 DIAGNOSIS — E1151 Type 2 diabetes mellitus with diabetic peripheral angiopathy without gangrene: Secondary | ICD-10-CM | POA: Diagnosis not present

## 2022-12-31 DIAGNOSIS — N182 Chronic kidney disease, stage 2 (mild): Secondary | ICD-10-CM | POA: Diagnosis not present

## 2022-12-31 DIAGNOSIS — D631 Anemia in chronic kidney disease: Secondary | ICD-10-CM | POA: Diagnosis not present

## 2022-12-31 DIAGNOSIS — E039 Hypothyroidism, unspecified: Secondary | ICD-10-CM | POA: Diagnosis not present

## 2022-12-31 DIAGNOSIS — I129 Hypertensive chronic kidney disease with stage 1 through stage 4 chronic kidney disease, or unspecified chronic kidney disease: Secondary | ICD-10-CM | POA: Diagnosis not present

## 2022-12-31 DIAGNOSIS — G40909 Epilepsy, unspecified, not intractable, without status epilepticus: Secondary | ICD-10-CM | POA: Diagnosis not present

## 2022-12-31 DIAGNOSIS — L89152 Pressure ulcer of sacral region, stage 2: Secondary | ICD-10-CM | POA: Diagnosis not present

## 2022-12-31 DIAGNOSIS — N390 Urinary tract infection, site not specified: Secondary | ICD-10-CM | POA: Diagnosis not present

## 2023-01-04 ENCOUNTER — Emergency Department (HOSPITAL_COMMUNITY): Payer: Medicare HMO

## 2023-01-04 ENCOUNTER — Encounter (HOSPITAL_COMMUNITY): Payer: Self-pay

## 2023-01-04 ENCOUNTER — Emergency Department (HOSPITAL_COMMUNITY)
Admission: EM | Admit: 2023-01-04 | Discharge: 2023-01-04 | Disposition: A | Discharge status: Home / Self Care | Payer: Medicare HMO | Attending: Emergency Medicine | Admitting: Emergency Medicine

## 2023-01-04 ENCOUNTER — Other Ambulatory Visit: Payer: Self-pay

## 2023-01-04 ENCOUNTER — Ambulatory Visit: Payer: Medicare HMO

## 2023-01-04 DIAGNOSIS — M4802 Spinal stenosis, cervical region: Secondary | ICD-10-CM | POA: Insufficient documentation

## 2023-01-04 DIAGNOSIS — I10 Essential (primary) hypertension: Secondary | ICD-10-CM | POA: Diagnosis not present

## 2023-01-04 DIAGNOSIS — L97518 Non-pressure chronic ulcer of other part of right foot with other specified severity: Secondary | ICD-10-CM | POA: Diagnosis not present

## 2023-01-04 DIAGNOSIS — Z743 Need for continuous supervision: Secondary | ICD-10-CM | POA: Diagnosis not present

## 2023-01-04 DIAGNOSIS — I152 Hypertension secondary to endocrine disorders: Secondary | ICD-10-CM | POA: Diagnosis not present

## 2023-01-04 DIAGNOSIS — Z794 Long term (current) use of insulin: Secondary | ICD-10-CM | POA: Diagnosis not present

## 2023-01-04 DIAGNOSIS — E039 Hypothyroidism, unspecified: Secondary | ICD-10-CM | POA: Insufficient documentation

## 2023-01-04 DIAGNOSIS — R531 Weakness: Secondary | ICD-10-CM

## 2023-01-04 DIAGNOSIS — E1122 Type 2 diabetes mellitus with diabetic chronic kidney disease: Secondary | ICD-10-CM | POA: Diagnosis not present

## 2023-01-04 DIAGNOSIS — F0390 Unspecified dementia without behavioral disturbance: Secondary | ICD-10-CM | POA: Insufficient documentation

## 2023-01-04 DIAGNOSIS — L89896 Pressure-induced deep tissue damage of other site: Secondary | ICD-10-CM | POA: Diagnosis not present

## 2023-01-04 DIAGNOSIS — D631 Anemia in chronic kidney disease: Secondary | ICD-10-CM | POA: Diagnosis not present

## 2023-01-04 DIAGNOSIS — Z1152 Encounter for screening for COVID-19: Secondary | ICD-10-CM | POA: Diagnosis not present

## 2023-01-04 DIAGNOSIS — E119 Type 2 diabetes mellitus without complications: Secondary | ICD-10-CM | POA: Insufficient documentation

## 2023-01-04 DIAGNOSIS — R7989 Other specified abnormal findings of blood chemistry: Secondary | ICD-10-CM | POA: Insufficient documentation

## 2023-01-04 DIAGNOSIS — G8220 Paraplegia, unspecified: Secondary | ICD-10-CM | POA: Insufficient documentation

## 2023-01-04 DIAGNOSIS — N182 Chronic kidney disease, stage 2 (mild): Secondary | ICD-10-CM | POA: Diagnosis not present

## 2023-01-04 DIAGNOSIS — E1159 Type 2 diabetes mellitus with other circulatory complications: Secondary | ICD-10-CM | POA: Diagnosis not present

## 2023-01-04 DIAGNOSIS — M5412 Radiculopathy, cervical region: Secondary | ICD-10-CM | POA: Insufficient documentation

## 2023-01-04 DIAGNOSIS — E11621 Type 2 diabetes mellitus with foot ulcer: Secondary | ICD-10-CM | POA: Diagnosis not present

## 2023-01-04 DIAGNOSIS — N3000 Acute cystitis without hematuria: Secondary | ICD-10-CM | POA: Diagnosis not present

## 2023-01-04 DIAGNOSIS — L89152 Pressure ulcer of sacral region, stage 2: Secondary | ICD-10-CM | POA: Diagnosis not present

## 2023-01-04 DIAGNOSIS — L97528 Non-pressure chronic ulcer of other part of left foot with other specified severity: Secondary | ICD-10-CM | POA: Diagnosis not present

## 2023-01-04 DIAGNOSIS — R5383 Other fatigue: Secondary | ICD-10-CM | POA: Diagnosis not present

## 2023-01-04 DIAGNOSIS — D72829 Elevated white blood cell count, unspecified: Secondary | ICD-10-CM | POA: Diagnosis not present

## 2023-01-04 LAB — URINALYSIS, ROUTINE W REFLEX MICROSCOPIC
Bilirubin Urine: NEGATIVE
Glucose, UA: NEGATIVE mg/dL
Hgb urine dipstick: NEGATIVE
Ketones, ur: NEGATIVE mg/dL
Nitrite: POSITIVE — AB
Protein, ur: NEGATIVE mg/dL
Specific Gravity, Urine: 1.004 — ABNORMAL LOW (ref 1.005–1.030)
WBC, UA: >50 WBC/hpf (ref 0–5)
pH: 7 (ref 5.0–8.0)

## 2023-01-04 LAB — COMPREHENSIVE METABOLIC PANEL
ALT: 27 U/L (ref 0–44)
AST: 33 U/L (ref 15–41)
Albumin: 3.3 g/dL — ABNORMAL LOW (ref 3.5–5.0)
Alkaline Phosphatase: 75 U/L (ref 38–126)
Anion gap: 8 (ref 5–15)
BUN: 14 mg/dL (ref 8–23)
CO2: 29 mmol/L (ref 22–32)
Calcium: 9.7 mg/dL (ref 8.9–10.3)
Chloride: 95 mmol/L — ABNORMAL LOW (ref 98–111)
Creatinine, Ser: 0.74 mg/dL (ref 0.44–1.00)
GFR, Estimated: >60 mL/min (ref >60)
Glucose, Bld: 167 mg/dL — ABNORMAL HIGH (ref 70–99)
Potassium: 4.3 mmol/L (ref 3.5–5.1)
Sodium: 132 mmol/L — ABNORMAL LOW (ref 135–145)
Total Bilirubin: 0.6 mg/dL (ref 0.3–1.2)
Total Protein: 6.9 g/dL (ref 6.5–8.1)

## 2023-01-04 LAB — CBC WITH DIFFERENTIAL/PLATELET
Abs Immature Granulocytes: 0.08 10*3/uL — ABNORMAL HIGH (ref 0.00–0.07)
Basophils Absolute: 0 10*3/uL (ref 0.0–0.1)
Basophils Relative: 0 %
Eosinophils Absolute: 0.2 10*3/uL (ref 0.0–0.5)
Eosinophils Relative: 2 %
HCT: 31.6 % — ABNORMAL LOW (ref 36.0–46.0)
Hemoglobin: 10.3 g/dL — ABNORMAL LOW (ref 12.0–15.0)
Immature Granulocytes: 1 %
Lymphocytes Relative: 3 %
Lymphs Abs: 0.4 10*3/uL — ABNORMAL LOW (ref 0.7–4.0)
MCH: 32.1 pg (ref 26.0–34.0)
MCHC: 32.6 g/dL (ref 30.0–36.0)
MCV: 98.4 fL (ref 80.0–100.0)
Monocytes Absolute: 0.5 10*3/uL (ref 0.1–1.0)
Monocytes Relative: 4 %
Neutro Abs: 12.3 10*3/uL — ABNORMAL HIGH (ref 1.7–7.7)
Neutrophils Relative %: 90 %
Platelets: 303 10*3/uL (ref 150–400)
RBC: 3.21 MIL/uL — ABNORMAL LOW (ref 3.87–5.11)
RDW: 15.5 % (ref 11.5–15.5)
WBC: 13.5 10*3/uL — ABNORMAL HIGH (ref 4.0–10.5)
nRBC: 0 % (ref 0.0–0.2)

## 2023-01-04 LAB — RESP PANEL BY RT-PCR (RSV, FLU A&B, COVID)  RVPGX2
Influenza A by PCR: NEGATIVE
Influenza B by PCR: NEGATIVE
Resp Syncytial Virus by PCR: NEGATIVE
SARS Coronavirus 2 by RT PCR: NEGATIVE

## 2023-01-04 LAB — MAGNESIUM: Magnesium: 1.7 mg/dL (ref 1.7–2.4)

## 2023-01-04 LAB — TROPONIN I (HIGH SENSITIVITY)
Troponin I (High Sensitivity): 9 ng/L (ref <18)
Troponin I (High Sensitivity): 9 ng/L (ref <18)

## 2023-01-04 LAB — CBG MONITORING, ED: Glucose-Capillary: 139 mg/dL — ABNORMAL HIGH (ref 70–99)

## 2023-01-04 MED ORDER — LACTATED RINGERS IV BOLUS
500.0000 mL | Freq: Once | INTRAVENOUS | Status: AC
  Administered 2023-01-04: 500 mL via INTRAVENOUS

## 2023-01-04 MED ORDER — SODIUM CHLORIDE 0.9 % IV SOLN
1.0000 g | Freq: Once | INTRAVENOUS | Status: AC
  Administered 2023-01-04: 1 g via INTRAVENOUS
  Filled 2023-01-04: qty 10

## 2023-01-04 MED ORDER — CEFPODOXIME PROXETIL 100 MG PO TABS: 100.0000 mg | ORAL_TABLET | Freq: Two times a day (BID) | ORAL | 0 refills | Status: AC

## 2023-01-04 NOTE — ED Provider Notes (Signed)
[facility] EMERGENCY DEPARTMENT AT [facility] Provider Note   CSN: [id number] Arrival date & time: 01/04/23  1055     History  Chief Complaint  Patient presents with   Fatigue    [name] [name] is a 89 y.o. female with T2DM on insulin, HTN, hypothyroidism, GERD, constipation, dementia, chronic dysphagia, sacral ulcer who presents with fatigue. Accompanied by daughter who provides history. Patient lives with her two daughter who are her caretakers. Daughter reports increased fatigue, sleepiness, decreased PO intake, and generalized weakness for the last two days. Unlike her mother. No f/c, N/V/D/C, CP, SOB, cough, flu-like symptoms, abd pain. Patient endorses BL leg pain at night which is a chronic problem for her. +Decreased PO intake. Daughter states that it's typically dehydration/electrolyte derangements when this has happened in the past. Per chart review was seen for lethargy/cloudy urine in March and had urosepsis. Has small wounds on toes managed by wound care o/p.  HPI     Home Medications Prior to Admission medications   Medication Sig Start Date End Date Taking? Authorizing Provider  cefpodoxime (VANTIN) 100 MG tablet Take 1 tablet (100 mg total) by mouth 2 (two) times daily for 7 days. 01/04/23 01/11/23 Yes Loetta Rough, MD  acetaminophen (TYLENOL) 650 MG CR tablet Take 650 mg by mouth every 8 (eight) hours as needed for pain.    [provider]  BAYER LOW DOSE 81 MG EC tablet Take 81 mg by mouth daily. Swallow whole.    [provider]  cetirizine (ZYRTEC) 10 MG tablet Take 10 mg by mouth in the morning, at noon, and at bedtime.    [provider]  Cholecalciferol (VITAMIN D-3 PO) Take 1,000 Units by mouth daily.    [provider]  Famotidine (PEPCID AC PO) Take 20 mg by mouth at bedtime.    [provider]  folic acid (FOLVITE) 1 MG tablet Take 1 mg by mouth at bedtime.    [provider]  insulin aspart  (NOVOLOG FLEXPEN) 100 UNIT/ML FlexPen Inject 2-10 Units into the skin See admin instructions. Inject 2-10 units into the skin three times a day with meals, per sliding scale:  Breakfast: BGL 80-199 = 8 units; 200-299 = 9 units; 300 or greater = 10 units Lunch: BGL 80-199 = 5 units; 200-299 = 6 units; 300 or greater = 7 units Supper/evening meal: BGL 80-199 = 2 units; 200-299 = 3 units; 300 or greater = 4 units    [provider]  insulin detemir (LEVEMIR FLEXTOUCH) 100 UNIT/ML FlexPen Inject 3-7 Units into the skin See admin instructions. Inject 7 units into the skin in the morning and 3 units at bedtime if BGL is 200 or below and 4 units if BGL is greater than 200    [provider]  levETIRAcetam (KEPPRA) 100 MG/ML solution Take 5 mLs (500 mg total) by mouth 2 (two) times daily. 10/05/22   Osvaldo Shipper, MD  levothyroxine (SYNTHROID) 100 MCG tablet Take 1 tablet (100 mcg total) by mouth daily before breakfast. 09/12/22 10/12/22  Jerald Kief, MD  liver oil-zinc oxide (DESITIN) 40 % ointment Apply topically 3 (three) times daily. Patient taking differently: Apply 1 Application topically as needed for irritation. 09/16/21   Rolly Salter, MD  loperamide (IMODIUM A-D) 2 MG tablet Take 2 mg by mouth 4 (four) times daily as needed for diarrhea or loose stools.    [provider]  methotrexate (RHEUMATREX) 2.5 MG tablet Take 1  tablet (2.5 mg total) by mouth once a week. Caution:Chemotherapy. Protect from light. Patient taking differently: Take 15 mg by mouth every Saturday. Caution:Chemotherapy. Protect from light. 01/27/22   Marinda Elk, MD  metoprolol tartrate (LOPRESSOR) 25 MG tablet Take 1 tablet (25 mg total) by mouth 2 (two) times daily. Patient taking differently: Take 12.5 mg by mouth as needed (high blood pressure). 01/20/22   Marinda Elk, MD  midodrine (PROAMATINE) 5 MG tablet Take 0.5 tablets (2.5 mg total) by mouth 3 (three) times daily with meals.  10/05/22   Osvaldo Shipper, MD  nystatin (MYCOSTATIN) 100000 UNIT/ML suspension Take 5 mLs (500,000 Units total) by mouth 4 (four) times daily. 10/05/22   Osvaldo Shipper, MD  polyethylene glycol (MIRALAX / GLYCOLAX) 17 g packet Take 17 g by mouth daily. Patient taking differently: Take 17 g by mouth daily as needed for mild constipation. 09/16/21   Rolly Salter, MD  predniSONE (DELTASONE) 5 MG tablet Take 5 mg by mouth daily with breakfast.    [provider]  triamcinolone ointment (KENALOG) 0.1 % Apply 1 Application topically 2 (two) times daily as needed (itching, rash). 07/16/22   [provider]      Allergies    Nsaids, Biaxin [clarithromycin], Bactrim [sulfamethoxazole-trimethoprim], Lisinopril, and Sulfa antibiotics    Review of Systems   Review of Systems A 10 point review of systems was performed and is negative unless otherwise reported in HPI.  Physical Exam Updated Vital Signs BP (!) 142/52   Pulse 65   Temp 98 F (36.7 C)   Resp 14   SpO2 100%  Physical Exam General: Frail-appearing female, lying in bed.  HEENT: PERRLA, Sclera anicteric, dry mucous membranes, trachea midline.  Cardiology: RRR, no murmurs/rubs/gallops. BL radial and DP pulses equal bilaterally.  Resp: Normal respiratory rate and effort. CTAB, no wheezes, rhonchi, crackles.  Abd: Soft, non-tender, non-distended. No rebound tenderness or guarding.  MSK: No peripheral edema or signs of trauma. Extremities without deformity or TTP. No cyanosis or clubbing. Skin: warm, dry. No rashes or lesions. Neuro: Sleepy but easily arousable to voice. Alert but not oriented (baseline), CNs II-XII grossly intact. MAEs. Sensation grossly intact.   ED Results / Procedures / Treatments   Labs (all labs ordered are listed, but only abnormal results are displayed) Labs Reviewed  CBC WITH DIFFERENTIAL/PLATELET - Abnormal; Notable for the following components:      Result Value   WBC 13.5 (*)    RBC  3.21 (*)    Hemoglobin 10.3 (*)    HCT 31.6 (*)    Neutro Abs 12.3 (*)    Lymphs Abs 0.4 (*)    Abs Immature Granulocytes 0.08 (*)    All other components within normal limits  COMPREHENSIVE METABOLIC PANEL - Abnormal; Notable for the following components:   Sodium 132 (*)    Chloride 95 (*)    Glucose, Bld 167 (*)    Albumin 3.3 (*)    All other components within normal limits  URINALYSIS, ROUTINE W REFLEX MICROSCOPIC - Abnormal; Notable for the following components:   APPearance HAZY (*)    Specific Gravity, Urine 1.004 (*)    Nitrite POSITIVE (*)    Leukocytes,Ua LARGE (*)    Bacteria, UA MANY (*)    All other components within normal limits  CBG MONITORING, ED - Abnormal; Notable for the following components:   Glucose-Capillary 139 (*)    All other components within normal limits  RESP PANEL BY RT-PCR (  RSV, FLU A&B, COVID)  RVPGX2  MAGNESIUM  TROPONIN I (HIGH SENSITIVITY)  TROPONIN I (HIGH SENSITIVITY)    EKG EKG Interpretation  Date/Time:  Monday January 04 2023 11:25:46 EDT Ventricular Rate:  77 PR Interval:  28 QRS Duration: 101 QT Interval:  393 QTC Calculation: 445 R Axis:   70 Text Interpretation: Sinus rhythm Confirmed by Vivi Barrack [phone]) on 01/04/2023 1:40:36 PM  Radiology DG Chest Portable 1 View  Result Date: 01/04/2023 CLINICAL DATA:  Weakness and leukocytosis. EXAM: PORTABLE CHEST 1 VIEW COMPARISON:  Chest x-ray dated October 14, 2022. FINDINGS: Unchanged loop recorder. The heart size and mediastinal contours are within normal limits. Normal pulmonary vascularity. No focal consolidation, pleural effusion, or pneumothorax. No acute osseous abnormality. IMPRESSION: No active disease. Electronically Signed   By: Obie Dredge M.D.   On: 01/04/2023 15:17    Procedures Procedures    Medications Ordered in ED Medications  cefTRIAXone (ROCEPHIN) 1 g in sodium chloride 0.9 % 100 mL IVPB (1 g Intravenous New Bag/Given 01/04/23 1636)  lactated ringers bolus  500 mL (0 mLs Intravenous Stopped 01/04/23 1637)    ED Course/ Medical Decision Making/ A&P                          Medical Decision Making Amount and/or Complexity of Data Reviewed Labs: ordered. Decision-making details documented in ED Course. Radiology: ordered.  Risk Prescription drug management.    This patient presents to the ED for concern of generalized weakness/fatigue/decreased PO intake, this involves an extensive number of treatment options, and is a complaint that carries with it a high risk of complications and morbidity.  I considered the following differential and admission for this acute, potentially life threatening condition. T 99.3.F, HDS.   MDM:    DDX for generalized weakness includes but is not limited to:  Infectious processes such as viral syndrome, UTI, or PNA; severe metabolic derangements or electrolyte abnormalities, ischemia/ACS though no CP/SOB, heart failure though no leg swelling, anemia. Considered intracranial/central processes but think these are unlikely given the history and physical exam, no FNDs, h/o dementia at baseline.    Clinical Course as of 01/04/23 1653  Mon Jan 04, 2023  1339 Glucose-Capillary(!): 139 unremarkable [HN]  1339 Comprehensive metabolic panel(!) Unremarkable in the context of this patient's presentation  [HN]  1339 WBC(!): 13.5 +Leukocytosis [HN]  1339 Hemoglobin(!): 10.3 Increased from 8 [HN]  1339 Troponin I (High Sensitivity): 9 wnl [HN]  1629 Urinalysis, Routine w reflex microscopic -Urine, Clean Catch(!) +UTI [HN]    Clinical Course User Index [HN] Loetta Rough, MD    Labs: I Ordered, and personally interpreted labs.  The pertinent results include:  those listed above  Imaging Studies ordered: I ordered imaging studies including CXR I independently visualized and interpreted imaging. I agree with the radiologist interpretation  Additional history obtained from chart review  Cardiac  Monitoring: The patient was maintained on a cardiac monitor.  I personally viewed and interpreted the cardiac monitored which showed an underlying rhythm of: NSR  Reevaluation: After the interventions noted above, I reevaluated the patient and found that they have :improved  Social Determinants of Health: Patient lives with 2 daughters who are caretakers  Disposition:  Pt with UTI and gen weakness. Will treat with ceftriaxone here and DC w/ cefpodoxime x 7 days. Daughter at bedside made aware.  I considered admission for this patient but she is hemodynamically stable with leukocytosis but without any  signs of sepsis.  Gave the daughter discharge instructions and return precautions, all questions answered to her satisfaction.   Co morbidities that complicate the patient evaluation  Past Medical History:  Diagnosis Date   Arthritis    "knees, legs" (06/10/2016)   Ascites    Chronic kidney disease    "related to my diabetes"   Chronic lower back pain    Diabetes mellitus without complication (HCC)    Eczema    GERD (gastroesophageal reflux disease)    High cholesterol    Hyperlipidemia    Hypertension    Hypothyroidism    OAB (overactive bladder)    Osteoporosis    Thyroid disease    Type II diabetes mellitus (HCC)    Urticaria      Medicines Meds ordered this encounter  Medications   lactated ringers bolus 500 mL   cefTRIAXone (ROCEPHIN) 1 g in sodium chloride 0.9 % 100 mL IVPB    Order Specific Question:   Antibiotic Indication:    Answer:   UTI   cefpodoxime (VANTIN) 100 MG tablet    Sig: Take 1 tablet (100 mg total) by mouth 2 (two) times daily for 7 days.    Dispense:  14 tablet    Refill:  0    I have reviewed the patients home medicines and have made adjustments as needed  Problem List / ED Course: Problem List Items Addressed This Visit       Genitourinary   UTI (urinary tract infection) - Primary   Relevant Medications   cefTRIAXone (ROCEPHIN) 1 g in  sodium chloride 0.9 % 100 mL IVPB   cefpodoxime (VANTIN) 100 MG tablet   Other Visit Diagnoses     Generalized weakness                       This note was created using dictation software, which may contain spelling or grammatical errors.    Loetta Rough, MD 01/04/23 [phone]

## 2023-01-04 NOTE — ED Notes (Signed)
Patient is currently on rocephin discharge is on hold.

## 2023-01-04 NOTE — ED Notes (Signed)
Attempt made to collect urine when notified of the patient's need to void. Patient emptied her bladder before staff was able to get a bed pan under her. While try again to collect a sample later.

## 2023-01-04 NOTE — Discharge Instructions (Addendum)
Thank you for coming to [facility] Emergency Department. You were seen for fatigue, generalized weakness. We did an exam, labs, and imaging, and these showed UTI.  We gave you IV antibiotics here in the emergency department and discharged with cefpodoxime 100 mg twice per day for 7 days which you can start tomorrow.  Please follow up with your primary care provider within 1-2 weeks as needed or if you don't improve.  Do not hesitate to return to the ED or call 911 if you experience: -Worsening symptoms -Lightheadedness, passing out -Fevers/chills -Anything else that concerns you

## 2023-01-04 NOTE — Progress Notes (Signed)
[name] [name], [name] [name] ([id number]) [id number] Page 1 of 11 Visit Report for 12/31/2022 Arrival Information Details Patient Name: Date of Service: [name] [name], [name] [name] 12/31/2022 2:15 PM Medical Record Number: [id number] Patient Account Number: [id number] Date of Birth/Sex: Treating RN: 09-16-1933 (89 y.o. [name] Primary Care Elham Fini: Hillard Danker Other Clinician: Referring Takara Sermons: Treating Ramzi Brathwaite/Extender: Kizzie Furnish Weeks in Treatment: 3 Visit Information History Since Last Visit Added or deleted any medications: No Patient Arrived: Wheel Chair Any new allergies or adverse reactions: No Arrival Time: 14:11 Had a fall or experienced change in No Accompanied By: daughter activities of daily living that may affect Transfer Assistance: Manual risk of falls: Patient Identification Verified: Yes Signs or symptoms of abuse/neglect since last visito No Secondary Verification Process Completed: Yes Hospitalized since last visit: No Patient Requires Transmission-Based Precautions: No Implantable device outside of the clinic excluding No Patient Has Alerts: No cellular tissue based products placed in the center since last visit: Has Dressing in Place as Prescribed: Yes Pain Present Now: Yes Electronic Signature(s) Signed: 12/31/2022 5:31:32 PM By: Shawn Stall RN, BSN Entered By: Shawn Stall on 12/31/2022 14:19:27 -------------------------------------------------------------------------------- Encounter Discharge Information Details Patient Name: Date of Service: [name] [name], [name] 12/31/2022 2:15 PM Medical Record Number: [id number] Patient Account Number: [id number] Date of Birth/Sex: Treating RN: 09/06/1933 (89 y.o. [name] Primary Care Brooklinn Longbottom: Hillard Danker Other Clinician: Referring Beatrice Ziehm: Treating Johany Hansman/Extender: Kizzie Furnish Weeks in Treatment: 3 Encounter Discharge Information Items Post  Procedure Vitals Discharge Condition: Stable Temperature (F): 97.7 Ambulatory Status: Wheelchair Pulse (bpm): 60 Discharge Destination: Home Respiratory Rate (breaths/min): 16 Transportation: Private Auto Blood Pressure (mmHg): 151/74 Accompanied By: daughter Schedule Follow-up Appointment: Yes Clinical Summary of Care: Electronic Signature(s) Signed: 12/31/2022 5:31:32 PM By: Shawn Stall RN, BSN Entered By: Shawn Stall on 12/31/2022 14:48:13 [name] [name], [name] [name] ([id number]) [id number] Page 2 of 11 -------------------------------------------------------------------------------- Lower Extremity Assessment Details Patient Name: Date of Service: [name] XAVIANNA, SUNDERMAN 12/31/2022 2:15 PM Medical Record Number: [id number] Patient Account Number: [id number] Date of Birth/Sex: Treating RN: 21-Aug-1933 (89 y.o. [name] Primary Care Rosiland Sen: Hillard Danker Other Clinician: Referring Dmario Russom: Treating Izzy Doubek/Extender: Kizzie Furnish Weeks in Treatment: 3 Edema Assessment Assessed: [Left: Yes] [Right: Yes] Edema: [Left: No] [Right: No] Calf Left: Right: Point of Measurement: 29 cm From Medial Instep Ankle Left: Right: Point of Measurement: 10 cm From Medial Instep Vascular Assessment Pulses: Dorsalis Pedis Palpable: [Left:Yes] [Right:Yes] Electronic Signature(s) Signed: 12/31/2022 5:31:32 PM By: Shawn Stall RN, BSN Entered By: Shawn Stall on 12/31/2022 14:22:29 -------------------------------------------------------------------------------- Multi Wound Chart Details Patient Name: Date of Service: [name] [name], [name] 12/31/2022 2:15 PM Medical Record Number: [id number] Patient Account Number: [id number] Date of Birth/Sex: Treating RN: February 09, 1934 (89 y.o. F) Primary Care Clarann Helvey: Hillard Danker Other Clinician: Referring Coral Soler: Treating Yariel Ferraris/Extender: Kizzie Furnish Weeks in Treatment: 3 Vital Signs Height(in):  66 Pulse(bpm): 60 Weight(lbs): 110 Blood Pressure(mmHg): 151/74 Body Mass Index(BMI): 17.8 Temperature(F): 97.7 Respiratory Rate(breaths/min): 16 [3:Photos:] [5:[id number] Page 3 of 11] Right, Distal T Great oe Left, Medial Foot Left T Great oe Wound Location: Gradually Appeared Gradually Appeared Gradually Appeared Wounding Event: Diabetic Wound/Ulcer of the Lower Diabetic Wound/Ulcer of the Lower Diabetic Wound/Ulcer of the Lower Primary Etiology: Extremity Extremity Extremity Hypertension, Hypotension, Peripheral Hypertension, Hypotension, Peripheral Hypertension, Hypotension, Peripheral Comorbid History: Arterial Disease, Peripheral Venous Arterial Disease, Peripheral Venous Arterial Disease, Peripheral Venous Disease, Colitis, Type II Diabetes, Disease, Colitis, Type II Diabetes, Disease, Colitis, Type II Diabetes, Dementia, Seizure  Disorder Dementia, Seizure Disorder Dementia, Seizure Disorder 12/09/2021 12/09/2021 12/09/2021 Date Acquired: 3 3 3  Weeks of Treatment: Open Open Open Wound Status: No No No Wound Recurrence: 0.5x0.7x0.1 0.2x0.2x0.1 0.2x0.2x0.1 Measurements L x W x D (cm) 0.275 0.031 0.031 A (cm) : rea 0.027 0.003 0.003 Volume (cm) : 56.20% 67.00% 75.40% % Reduction in A rea: 57.10% 66.70% 76.90% % Reduction in Volume: Grade 1 Grade 1 Grade 1 Classification: Medium None Present None Present Exudate A mount: Serosanguineous N/A N/A Exudate Type: red, brown N/A N/A Exudate Color: Fibrotic scar, thickened scar Distinct, outline attached Distinct, outline attached Wound Margin: None Present (0%) None Present (0%) None Present (0%) Granulation A mount: Large (67-100%) Large (67-100%) Large (67-100%) Necrotic A mount: Fat Layer (Subcutaneous Tissue): Yes Fascia: No Fat Layer (Subcutaneous Tissue): Yes Exposed Structures: Fascia: No Fat Layer (Subcutaneous Tissue): No Fascia: No Tendon: No Tendon: No Tendon: No Muscle:  No Muscle: No Muscle: No Joint: No Joint: No Joint: No Bone: No Bone: No Bone: No None None None Epithelialization: Chemical/Enzymatic/Mechanical Chemical/Enzymatic/Mechanical Chemical/Enzymatic/Mechanical Debridement: N/A N/A N/A Instrument: None None None Bleeding: Debridement Treatment Response: Procedure was tolerated well Procedure was tolerated well Procedure was tolerated well Post Debridement Measurements L x 0.5x0.7x0.1 0.2x0.2x0.1 0.2x0.2x0.1 W x D (cm) 0.027 0.003 0.003 Post Debridement Volume: (cm) No Abnormalities Noted No Abnormalities Noted No Abnormalities Noted Periwound Skin Texture: No Abnormalities Noted No Abnormalities Noted No Abnormalities Noted Periwound Skin Moisture: No Abnormalities Noted No Abnormalities Noted No Abnormalities Noted Periwound Skin Color: No Abnormality No Abnormality No Abnormality Temperature: Debridement Debridement Debridement Procedures Performed: Treatment Notes Wound #3 (Toe Great) Wound Laterality: Right, Distal Cleanser Soap and Water Discharge Instruction: May shower and wash wound with dial antibacterial soap and water prior to dressing change. Wound Cleanser Discharge Instruction: Cleanse the wound with wound cleanser prior to applying a clean dressing using gauze sponges, not tissue or cotton balls. Peri-Wound Care Topical Primary Dressing Hydrofera Blue Ready Transfer Foam, 2.5x2.5 (in/in) Discharge Instruction: Apply directly to wound bed as directed Santyl Ointment Discharge Instruction: Apply nickel thick amount to wound bed as instructed Secondary Dressing Secured With Conforming Stretch Gauze Bandage, Sterile 2x75 (in/in) Discharge Instruction: Secure with stretch gauze as directed. 70M Medipore Soft Cloth Surgical T 2x10 (in/yd) ape Discharge Instruction: Secure with tape as directed. Compression Wrap Compression Stockings Add-Ons Wound #4 (Foot) Wound Laterality: Left, Medial ATZIN, SYLVIS  ([id number]) [id number] Page 4 of 11 Cleanser Soap and Water Discharge Instruction: May shower and wash wound with dial antibacterial soap and water prior to dressing change. Wound Cleanser Discharge Instruction: Cleanse the wound with wound cleanser prior to applying a clean dressing using gauze sponges, not tissue or cotton balls. Peri-Wound Care Topical Primary Dressing Hydrofera Blue Ready Transfer Foam, 2.5x2.5 (in/in) Discharge Instruction: Apply directly to wound bed as directed Santyl Ointment Discharge Instruction: Apply nickel thick amount to wound bed as instructed Secondary Dressing Bordered Gauze, 2x2 in Discharge Instruction: Apply over primary dressing as directed. Secured With Compression Wrap Compression Stockings Add-Ons Wound #5 (Toe Great) Wound Laterality: Left Cleanser Soap and Water Discharge Instruction: May shower and wash wound with dial antibacterial soap and water prior to dressing change. Wound Cleanser Discharge Instruction: Cleanse the wound with wound cleanser prior to applying a clean dressing using gauze sponges, not tissue or cotton balls. Peri-Wound Care Topical Primary Dressing Hydrofera Blue Ready Transfer Foam, 2.5x2.5 (in/in) Discharge Instruction: Apply directly to wound bed as directed Santyl Ointment Discharge Instruction: Apply nickel thick amount to wound  bed as instructed Secondary Dressing Secured With Conforming Stretch Gauze Bandage, Sterile 2x75 (in/in) Discharge Instruction: Secure with stretch gauze as directed. 3M Medipore Soft Cloth Surgical T 2x10 (in/yd) ape Discharge Instruction: Secure with tape as directed. Compression Wrap Compression Stockings Add-Ons Electronic Signature(s) Signed: 01/04/2023 12:26:35 PM By: Geralyn Corwin DO Entered By: Geralyn Corwin on 12/31/2022 14:48:34 Multi-Disciplinary Care Plan  Details -------------------------------------------------------------------------------- [name] [name] ([id number]) [id number] Page 5 of 11 Patient Name: Date of Service: [name] [name], [name] [name] 12/31/2022 2:15 PM Medical Record Number: [id number] Patient Account Number: [id number] Date of Birth/Sex: Treating RN: 04-Nov-1933 (89 y.o. [name] Primary Care Taylie Helder: Hillard Danker Other Clinician: Referring Nollie Terlizzi: Treating Alexander Mcauley/Extender: Kizzie Furnish Weeks in Treatment: 3 Active Inactive Wound/Skin Impairment Nursing Diagnoses: Impaired tissue integrity Goals: Patient/caregiver will verbalize understanding of skin care regimen Date Initiated: 12/10/2022 Target Resolution Date: 02/17/2023 Goal Status: Active Interventions: Assess ulceration(s) every visit Treatment Activities: Skin care regimen initiated : 12/10/2022 Notes: Electronic Signature(s) Signed: 12/31/2022 5:31:32 PM By: Shawn Stall RN, BSN Entered By: Shawn Stall on 12/31/2022 14:28:04 -------------------------------------------------------------------------------- Pain Assessment Details Patient Name: Date of Service: [name] Spruce M. 12/31/2022 2:15 PM Medical Record Number: [id number] Patient Account Number: [id number] Date of Birth/Sex: Treating RN: 1934/02/08 (89 y.o. [name] Primary Care Paylin Hailu: Hillard Danker Other Clinician: Referring Rozell Theiler: Treating Awanda Wilcock/Extender: Kizzie Furnish Weeks in Treatment: 3 Active Problems Location of Pain Severity and Description of Pain Patient Has Paino Yes Site Locations Pain Location: Generalized Pain, Pain in Ulcers Rate the pain. Current Pain Level: 5 Character of Pain Describe the Pain: Vonette, Malczewski ([id number]) [id number] Page 6 of 11 Pain Management and Medication Current Pain Management: Medication: No Cold Application: No Rest:  No Massage: No Activity: No T.E.N.S.: No Heat Application: No Leg drop or elevation: No Is the Current Pain Management Adequate: Adequate How does your wound impact your activities of daily livingo Sleep: No Bathing: No Appetite: No Relationship With Others: No Bladder Continence: No Emotions: No Bowel Continence: No Work: No Toileting: No Drive: No Dressing: No Hobbies: No Psychologist, prison and probation services) Signed: 12/31/2022 5:31:32 PM By: Shawn Stall RN, BSN Entered By: Shawn Stall on 12/31/2022 14:19:57 -------------------------------------------------------------------------------- Patient/Caregiver Education Details Patient Name: Date of Service: [name] [name] 6/13/2024andnbsp2:15 PM Medical Record Number: [id number] Patient Account Number: [id number] Date of Birth/Gender: Treating RN: February 13, 1934 (88 y.o. [name] Primary Care Physician: Hillard Danker Other Clinician: Referring Physician: Treating Physician/Extender: Kizzie Furnish Weeks in Treatment: 3 Education Assessment Education Provided To: Patient Education Topics Provided Wound/Skin Impairment: Handouts: Caring for Your Ulcer Methods: Explain/Verbal Responses: Reinforcements needed Electronic Signature(s) Signed: 12/31/2022 5:31:32 PM By: Shawn Stall RN, BSN Entered By: Shawn Stall on 12/31/2022 14:28:14 -------------------------------------------------------------------------------- Wound Assessment Details Patient Name: Date of Service: [name] [name], [name] 12/31/2022 2:15 PM Medical Record Number: [id number] Patient Account Number: [id number] Date of Birth/Sex: Treating RN: March 28, 1934 (89 y.o. [name] Primary Care Kelyse Pask: Hillard Danker Other Clinician: Referring Sarann Tregre: Treating Marieann Zipp/Extender: Kizzie Furnish Weeks in Treatment: 3 Wound Status [name] [name], [name] [name] ([id number]) [id number] Page 7 of 11 Wound Number: 3 Primary  Diabetic Wound/Ulcer of the Lower Extremity Etiology: Wound Location: Right, Distal T Great oe Wound Open Wounding Event: Gradually Appeared Status: Date Acquired: 12/09/2021 Comorbid Hypertension, Hypotension, Peripheral Arterial Disease, Peripheral Weeks Of Treatment: 3 History: Venous Disease, Colitis, Type II Diabetes, Dementia, Seizure Clustered Wound: No Disorder Photos Wound Measurements Length: (cm) 0.5 Width: (cm) 0.7 Depth: (cm) 0.1  Area: (cm) 0.275 Volume: (cm) 0.027 % Reduction in Area: 56.2% % Reduction in Volume: 57.1% Epithelialization: None Tunneling: No Undermining: No Wound Description Classification: Grade 1 Wound Margin: Fibrotic scar, thickened scar Exudate Amount: Medium Exudate Type: Serosanguineous Exudate Color: red, brown Foul Odor After Cleansing: No Slough/Fibrino Yes Wound Bed Granulation Amount: None Present (0%) Exposed Structure Necrotic Amount: Large (67-100%) Fascia Exposed: No Necrotic Quality: Adherent Slough Fat Layer (Subcutaneous Tissue) Exposed: Yes Tendon Exposed: No Muscle Exposed: No Joint Exposed: No Bone Exposed: No Periwound Skin Texture Texture Color No Abnormalities Noted: Yes No Abnormalities Noted: Yes Moisture Temperature / Pain No Abnormalities Noted: Yes Temperature: No Abnormality Treatment Notes Wound #3 (Toe Great) Wound Laterality: Right, Distal Cleanser Soap and Water Discharge Instruction: May shower and wash wound with dial antibacterial soap and water prior to dressing change. Wound Cleanser Discharge Instruction: Cleanse the wound with wound cleanser prior to applying a clean dressing using gauze sponges, not tissue or cotton balls. Peri-Wound Care Topical Primary Dressing Hydrofera Blue Ready Transfer Foam, 2.5x2.5 (in/in) Discharge Instruction: Apply directly to wound bed as directed Santyl Ointment Discharge Instruction: Apply nickel thick amount to wound bed as instructed Secondary  Dressing Secured With HASTI, VAUGHNS ([id number]) [id number] Page 8 of 11 Conforming Stretch Gauze Bandage, Sterile 2x75 (in/in) Discharge Instruction: Secure with stretch gauze as directed. 68M Medipore Soft Cloth Surgical T 2x10 (in/yd) ape Discharge Instruction: Secure with tape as directed. Compression Wrap Compression Stockings Add-Ons Electronic Signature(s) Signed: 12/31/2022 5:31:32 PM By: Shawn Stall RN, BSN Entered By: Shawn Stall on 12/31/2022 14:26:19 -------------------------------------------------------------------------------- Wound Assessment Details Patient Name: Date of Service: [name] [name], [name] 12/31/2022 2:15 PM Medical Record Number: [id number] Patient Account Number: [id number] Date of Birth/Sex: Treating RN: 12/12/1933 (89 y.o. [name], [name] Primary Care Delshon Blanchfield: Hillard Danker Other Clinician: Referring Jesse Nosbisch: Treating Tambra Muller/Extender: Kizzie Furnish Weeks in Treatment: 3 Wound Status Wound Number: 4 Primary Diabetic Wound/Ulcer of the Lower Extremity Etiology: Wound Location: Left, Medial Foot Wound Open Wounding Event: Gradually Appeared Status: Date Acquired: 12/09/2021 Comorbid Hypertension, Hypotension, Peripheral Arterial Disease, Peripheral Weeks Of Treatment: 3 History: Venous Disease, Colitis, Type II Diabetes, Dementia, Seizure Clustered Wound: No Disorder Photos Wound Measurements Length: (cm) 0.2 Width: (cm) 0.2 Depth: (cm) 0.1 Area: (cm) 0.031 Volume: (cm) 0.003 % Reduction in Area: 67% % Reduction in Volume: 66.7% Epithelialization: None Tunneling: No Undermining: No Wound Description Classification: Grade 1 Wound Margin: Distinct, outline attached Exudate Amount: None Present Foul Odor After Cleansing: No Slough/Fibrino Yes Wound Bed Granulation Amount: None Present (0%) Exposed Structure Necrotic Amount: Large (67-100%) Fascia Exposed: No Necrotic Quality: Adherent  Slough Fat Layer (Subcutaneous Tissue) Exposed: No Tendon Exposed: No Muscle Exposed: No Joint Exposed: No Bone Exposed: No [name] [name], [name] [name] ([id number]) [id number] Page 9 of 11 Periwound Skin Texture Texture Color No Abnormalities Noted: Yes No Abnormalities Noted: Yes Moisture Temperature / Pain No Abnormalities Noted: Yes Temperature: No Abnormality Treatment Notes Wound #4 (Foot) Wound Laterality: Left, Medial Cleanser Soap and Water Discharge Instruction: May shower and wash wound with dial antibacterial soap and water prior to dressing change. Wound Cleanser Discharge Instruction: Cleanse the wound with wound cleanser prior to applying a clean dressing using gauze sponges, not tissue or cotton balls. Peri-Wound Care Topical Primary Dressing Hydrofera Blue Ready Transfer Foam, 2.5x2.5 (in/in) Discharge Instruction: Apply directly to wound bed as directed Santyl Ointment Discharge Instruction: Apply nickel thick amount to wound bed as instructed Secondary Dressing Bordered Gauze, 2x2 in Discharge Instruction: Apply over  primary dressing as directed. Secured With Compression Wrap Compression Stockings Facilities manager) Signed: 12/31/2022 5:31:32 PM By: Shawn Stall RN, BSN Entered By: Shawn Stall on 12/31/2022 14:26:38 -------------------------------------------------------------------------------- Wound Assessment Details Patient Name: Date of Service: [name] [name], [name] 12/31/2022 2:15 PM Medical Record Number: [id number] Patient Account Number: [id number] Date of Birth/Sex: Treating RN: 04/08/1934 (89 y.o. [name] Primary Care Genevia Bouldin: Hillard Danker Other Clinician: Referring Rayson Rando: Treating Gustie Bobb/Extender: Kizzie Furnish Weeks in Treatment: 3 Wound Status Wound Number: 5 Primary Diabetic Wound/Ulcer of the Lower Extremity Etiology: Wound Location: Left T Great oe Wound Open Wounding  Event: Gradually Appeared Status: Date Acquired: 12/09/2021 Comorbid Hypertension, Hypotension, Peripheral Arterial Disease, Peripheral Weeks Of Treatment: 3 History: Venous Disease, Colitis, Type II Diabetes, Dementia, Seizure Clustered Wound: No Disorder Photos [name] [name], [name] [name] ([id number]) [id number] Page 10 of 11 Wound Measurements Length: (cm) 0.2 Width: (cm) 0.2 Depth: (cm) 0.1 Area: (cm) 0.031 Volume: (cm) 0.003 % Reduction in Area: 75.4% % Reduction in Volume: 76.9% Epithelialization: None Tunneling: No Undermining: No Wound Description Classification: Grade 1 Wound Margin: Distinct, outline attached Exudate Amount: None Present Foul Odor After Cleansing: No Slough/Fibrino Yes Wound Bed Granulation Amount: None Present (0%) Exposed Structure Necrotic Amount: Large (67-100%) Fascia Exposed: No Necrotic Quality: Adherent Slough Fat Layer (Subcutaneous Tissue) Exposed: Yes Tendon Exposed: No Muscle Exposed: No Joint Exposed: No Bone Exposed: No Periwound Skin Texture Texture Color No Abnormalities Noted: Yes No Abnormalities Noted: Yes Moisture Temperature / Pain No Abnormalities Noted: Yes Temperature: No Abnormality Treatment Notes Wound #5 (Toe Great) Wound Laterality: Left Cleanser Soap and Water Discharge Instruction: May shower and wash wound with dial antibacterial soap and water prior to dressing change. Wound Cleanser Discharge Instruction: Cleanse the wound with wound cleanser prior to applying a clean dressing using gauze sponges, not tissue or cotton balls. Peri-Wound Care Topical Primary Dressing Hydrofera Blue Ready Transfer Foam, 2.5x2.5 (in/in) Discharge Instruction: Apply directly to wound bed as directed Santyl Ointment Discharge Instruction: Apply nickel thick amount to wound bed as instructed Secondary Dressing Secured With Conforming Stretch Gauze Bandage, Sterile 2x75 (in/in) Discharge Instruction: Secure  with stretch gauze as directed. 60M Medipore Soft Cloth Surgical T 2x10 (in/yd) ape Discharge Instruction: Secure with tape as directed. Compression Wrap Compression Stockings Add-Ons [name] [name], [name] [name] ([id number]) [id number] Page 11 of 11 Electronic Signature(s) Signed: 12/31/2022 5:31:32 PM By: Shawn Stall RN, BSN Entered By: Shawn Stall on 12/31/2022 14:26:58 -------------------------------------------------------------------------------- Vitals Details Patient Name: Date of Service: [name] [name], Dorann M. 12/31/2022 2:15 PM Medical Record Number: [id number] Patient Account Number: [id number] Date of Birth/Sex: Treating RN: 1934-06-10 (89 y.o. [name], [name] Primary Care Alyssabeth Bruster: Hillard Danker Other Clinician: Referring Kenzleigh Sedam: Treating Madiha Bambrick/Extender: Kizzie Furnish Weeks in Treatment: 3 Vital Signs Time Taken: 14:19 Temperature (F): 97.7 Height (in): 66 Pulse (bpm): 60 Weight (lbs): 110 Respiratory Rate (breaths/min): 16 Body Mass Index (BMI): 17.8 Blood Pressure (mmHg): 151/74 Reference Range: 80 - 120 mg / dl Electronic Signature(s) Signed: 12/31/2022 5:31:32 PM By: Shawn Stall RN, BSN Entered By: Shawn Stall on 12/31/2022 14:19:45

## 2023-01-04 NOTE — ED Notes (Signed)
Patient is having an ultrasound IV. Not able to get access.

## 2023-01-04 NOTE — Progress Notes (Signed)
[name] [name] ([id number]) [id number] Page 1 of 11 Visit Report for 12/31/2022 Chief Complaint Document Details Patient Name: Date of Service: [name] [name], [name] [name] 12/31/2022 2:15 PM Medical Record Number: [id number] Patient Account Number: [id number] Date of Birth/Sex: Treating [name]: 11-16-1933 (89 y.o. F) Primary Care Provider: Hillard [name] Other Clinician: Referring Provider: Treating Provider/Extender: Kizzie Furnish Weeks in Treatment: 3 Information Obtained from: Patient Chief Complaint 12/10/2022; small open wounds to the feet bilaterally Electronic Signature(s) Signed: 01/04/2023 12:26:35 PM By: [name] Corwin DO Entered By: [name] [name] on 12/31/2022 14:48:43 -------------------------------------------------------------------------------- Debridement Details Patient Name: Date of Service: [name] [name], [name] 12/31/2022 2:15 PM Medical Record Number: [id number] Patient Account Number: [id number] Date of Birth/Sex: Treating [name]: 10-Jul-1934 (89 y.o. [name] Primary Care Provider: Hillard [name] Other Clinician: Referring Provider: Treating Provider/Extender: Kizzie Furnish Weeks in Treatment: 3 Debridement Performed for Assessment: Wound #3 Right,Distal T Great oe Performed By: [name] [name] Debridement Type: Chemical/Enzymatic/Mechanical Agent Used: Santyl Severity of Tissue Pre Debridement: Limited to breakdown of skin Level of Consciousness (Pre-procedure): Awake and Alert Pre-procedure Verification/Time Out No Taken: Percent of Wound Bed Debrided: Bleeding: None Response to Treatment: Procedure was tolerated well Level of Consciousness (Post- Awake and Alert procedure): Post Debridement Measurements of Total Wound Length: (cm) 0.5 Width: (cm) 0.7 Depth: (cm) 0.1 Volume: (cm) 0.027 Character of Wound/Ulcer Post Debridement: Requires Further Debridement Severity of Tissue Post Debridement:  Limited to breakdown of skin Post Procedure Diagnosis Same as Pre-procedure Electronic Signature(s) Signed: 12/31/2022 5:31:32 PM By: [name] Stall [name], BSN Signed: 01/04/2023 12:26:35 PM By: [name] [name], Pincus Sanes ([id number]) [id number] Page 2 of 11 Entered By: [name] [name] on 12/31/2022 14:46:50 -------------------------------------------------------------------------------- Debridement Details Patient Name: Date of Service: [name] [name] 12/31/2022 2:15 PM Medical Record Number: [id number] Patient Account Number: [id number] Date of Birth/Sex: Treating [name]: March 12, 1934 (89 y.o. [name] Primary Care Provider: Hillard [name] Other Clinician: Referring Provider: Treating Provider/Extender: Kizzie Furnish Weeks in Treatment: 3 Debridement Performed for Assessment: Wound #4 Left,Medial Foot Performed By: Clinician [name] Stall, [name] Debridement Type: Chemical/Enzymatic/Mechanical Agent Used: Santyl Severity of Tissue Pre Debridement: Limited to breakdown of skin Level of Consciousness (Pre-procedure): Awake and Alert Pre-procedure Verification/Time Out No Taken: Percent of Wound Bed Debrided: Bleeding: None Response to Treatment: Procedure was tolerated well Level of Consciousness (Post- Awake and Alert procedure): Post Debridement Measurements of Total Wound Length: (cm) 0.2 Width: (cm) 0.2 Depth: (cm) 0.1 Volume: (cm) 0.003 Character of Wound/Ulcer Post Debridement: Requires Further Debridement Severity of Tissue Post Debridement: Limited to breakdown of skin Post Procedure Diagnosis Same as Pre-procedure Electronic Signature(s) Signed: 12/31/2022 5:31:32 PM By: [name] Stall [name], BSN Signed: 01/04/2023 12:26:35 PM By: [name] Corwin DO Entered By: [name] [name] on 12/31/2022 14:47:12 -------------------------------------------------------------------------------- Debridement Details Patient Name: Date of  Service: [name] [name], [name] 12/31/2022 2:15 PM Medical Record Number: [id number] Patient Account Number: [id number] Date of Birth/Sex: Treating [name]: [birth year] (89 y.o. [name] Primary Care Provider: Hillard [name] Other Clinician: Referring Provider: Treating Provider/Extender: Kizzie Furnish Weeks in Treatment: 3 Debridement Performed for Assessment: Wound #5 Left T Great oe Performed By: [name] [name] Debridement Type: Chemical/Enzymatic/Mechanical Agent Used: Santyl Severity of Tissue Pre Debridement: Limited to breakdown of skin Level of Consciousness (Pre-procedure): Awake and Alert Pre-procedure Verification/Time Out No Taken: Percent of Wound Bed Debrided: Bleeding: None Response to Treatment: Procedure was tolerated well Level of Consciousness [address][name] [name] ([id number]) [id number] Page  3 of 11 Level of Consciousness (Post- Awake and Alert procedure): Post Debridement Measurements of Total Wound Length: (cm) 0.2 Width: (cm) 0.2 Depth: (cm) 0.1 Volume: (cm) 0.003 Character of Wound/Ulcer Post Debridement: Requires Further Debridement Severity of Tissue Post Debridement: Limited to breakdown of skin Post Procedure Diagnosis Same as Pre-procedure Electronic Signature(s) Signed: 12/31/2022 5:31:32 PM By: [name] Stall [name], BSN Signed: 01/04/2023 12:26:35 PM By: [name] Corwin DO Entered By: [name] [name] on 12/31/2022 14:47:27 -------------------------------------------------------------------------------- HPI Details Patient Name: Date of Service: [name] [name], [name] 12/31/2022 2:15 PM Medical Record Number: [id number] Patient Account Number: [id number] Date of Birth/Sex: Treating [name]: 02/04/1934 (89 y.o. F) Primary Care Provider: Hillard [name] Other Clinician: Referring Provider: Treating Provider/Extender: Kizzie Furnish Weeks in Treatment: 3 History of Present Illness HPI Description:  12/10/2022 [name] [name] is an 89 year old female with a past medical history of PAD, dementia, uncontrolled type 2 diabetes on insulin that presents the clinic for a 1 year history of wounds to her feet bilaterally. Daughter is present and helps provide the history. How the wounds have started is unclear. She has been using surgical shoes to offload the wounds. She has been using Santyl to the wound beds. She has slight erythema that daughter states is new around the periwound on the right and left great toe. She has a history of PAD and is followed with vein and vascular for this last seeing them on 10/2019. She had no wounds at that time but ABIs showed moderate right infrapopliteal artery occlusive disease and moderate multisegmental left lower extremity arterial occlusive disease. Patient has chronic pain to the wound sites. 6/4; send patient with wounds at the tip of her left and right first toes and the left medial MTP. We have been using Santyl to the wounds. 6/13; patient presents for follow-up. Patient has been using Santyl and Hydrofera Blue. She has no issues or complaints. She is scheduled to see vein and vascular on 7/10. Electronic Signature(s) Signed: 01/04/2023 12:26:35 PM By: [name] Corwin DO Entered By: [name] [name] on 12/31/2022 14:49:35 -------------------------------------------------------------------------------- Physical Exam Details Patient Name: Date of Service: [name] [name], Tannis M. 12/31/2022 2:15 PM Medical Record Number: [id number] Patient Account Number: [id number] Date of Birth/Sex: Treating [name]: 08-24-1933 (89 y.o. F) Primary Care Provider: Hillard [name] Other Clinician: Referring Provider: Treating Provider/Extender: Kizzie Furnish Weeks in Treatment: 3 Constitutional respirations regular, non-labored and within target range for patient.Marland Kitchen LEELU, NISLY ([id number]) [id number] Page 4 of 11 Cardiovascular 2+  dorsalis pedis/posterior tibialis pulses. Psychiatric pleasant and cooperative. Notes Wounds to the distal aspect of the great toes with nonviable surface R>L. T the medial left foot over a bunion there is a small open wound with No increased o warmth, erythema or purulent drainage. Electronic Signature(s) Signed: 01/04/2023 12:26:35 PM By: [name] Corwin DO Entered By: [name] [name] on 12/31/2022 14:51:48 -------------------------------------------------------------------------------- Physician Orders Details Patient Name: Date of Service: [name] [name], Tanelle M. 12/31/2022 2:15 PM Medical Record Number: [id number] Patient Account Number: [id number] Date of Birth/Sex: Treating [name]: [birth year] (89 y.o. [name], [name] Primary Care Provider: Hillard [name] Other Clinician: Referring Provider: Treating Provider/Extender: Kizzie Furnish Weeks in Treatment: 3 Verbal / Phone Orders: No Diagnosis Coding ICD-10 Coding Code Description L97.518 Non-pressure chronic ulcer of other part of right foot with other specified severity L97.528 Non-pressure chronic ulcer of other part of left foot with other specified severity E11.621 Type 2 diabetes mellitus with foot ulcer F03.90 Unspecified dementia, unspecified severity, without behavioral disturbance, psychotic  disturbance, mood disturbance, and anxiety I73.9 Peripheral vascular disease, unspecified Follow-up Appointments ppointment in 2 weeks. - Dr. Mikey Bussing Monday 01/18/2023 3pm room 8 Return A Other: - follow up with Vein and Vascular 01/27/2023 Anesthetic (In clinic) Topical Lidocaine 4% applied to wound bed Bathing/ Shower/ Hygiene Other Bathing/Shower/Hygiene Orders/Instructions: - Bathe feet with soap and water as tolerated. (please do not soak the feet) Off-Loading Other: - Float feet with a pillow under the calf while in bed Home Health New wound care orders this week; continue Home Health for wound care. May utilize  formulary equivalent dressing for wound treatment orders unless otherwise specified. - Please use Santyl on wound bed and then place Hydrafera Blue Ready on Santyl for the 3 wounds. Dressing changes to be completed by Home Health on Monday / Wednesday / Friday except when patient has scheduled visit at [facility]. - Or 3 times a week for Home Health Other Home Health Orders/Instructions: - [facility] Wound #3 - T Great oe Wound Laterality: Right, Distal Cleanser: Soap and Water 1 x Per Day/30 Days Discharge Instructions: May shower and wash wound with dial antibacterial soap and water prior to dressing change. Cleanser: Wound Cleanser 1 x Per Day/30 Days Discharge Instructions: Cleanse the wound with wound cleanser prior to applying a clean dressing using gauze sponges, not tissue or cotton balls. Prim Dressing: Hydrofera Blue Ready Transfer Foam, 2.5x2.5 (in/in) 1 x Per Day/30 Days ary Discharge Instructions: Apply directly to wound bed as directed Prim Dressing: Santyl Ointment ary 1 x Per Day/30 Days VALYNCIA, ZIRPOLI ([id number]) [id number] Page 5 of 11 Discharge Instructions: Apply nickel thick amount to wound bed as instructed Secured With: Conforming Stretch Gauze Bandage, Sterile 2x75 (in/in) 1 x Per Day/30 Days Discharge Instructions: Secure with stretch gauze as directed. Secured With: 52M Medipore Scientist, research (life sciences) Surgical T 2x10 (in/yd) 1 x Per Day/30 Days ape Discharge Instructions: Secure with tape as directed. Wound #4 - Foot Wound Laterality: Left, Medial Cleanser: Soap and Water 1 x Per Day/30 Days Discharge Instructions: May shower and wash wound with dial antibacterial soap and water prior to dressing change. Cleanser: Wound Cleanser 1 x Per Day/30 Days Discharge Instructions: Cleanse the wound with wound cleanser prior to applying a clean dressing using gauze sponges, not tissue or cotton balls. Prim Dressing: Hydrofera Blue  Ready Transfer Foam, 2.5x2.5 (in/in) 1 x Per Day/30 Days ary Discharge Instructions: Apply directly to wound bed as directed Prim Dressing: Santyl Ointment 1 x Per Day/30 Days ary Discharge Instructions: Apply nickel thick amount to wound bed as instructed Secondary Dressing: Bordered Gauze, 2x2 in 1 x Per Day/30 Days Discharge Instructions: Apply over primary dressing as directed. Wound #5 - T Great oe Wound Laterality: Left Cleanser: Soap and Water 1 x Per Day/30 Days Discharge Instructions: May shower and wash wound with dial antibacterial soap and water prior to dressing change. Cleanser: Wound Cleanser 1 x Per Day/30 Days Discharge Instructions: Cleanse the wound with wound cleanser prior to applying a clean dressing using gauze sponges, not tissue or cotton balls. Prim Dressing: Hydrofera Blue Ready Transfer Foam, 2.5x2.5 (in/in) 1 x Per Day/30 Days ary Discharge Instructions: Apply directly to wound bed as directed Prim Dressing: Santyl Ointment 1 x Per Day/30 Days ary Discharge Instructions: Apply nickel thick amount to wound bed as instructed Secured With: Conforming Stretch Gauze Bandage, Sterile 2x75 (in/in) 1 x Per Day/30 Days Discharge Instructions: Secure with stretch gauze as directed. Secured With: Warehouse manager Surgical  T 2x10 (in/yd) 1 x Per Day/30 Days ape Discharge Instructions: Secure with tape as directed. Electronic Signature(s) Signed: 01/04/2023 12:26:35 PM By: [name] Corwin DO Entered By: [name] [name] on 12/31/2022 14:51:57 -------------------------------------------------------------------------------- Problem List Details Patient Name: Date of Service: [name] [name], [name] 12/31/2022 2:15 PM Medical Record Number: [id number] Patient Account Number: [id number] Date of Birth/Sex: Treating [name]: 05-Sep-1933 (89 y.o. [name] Primary Care Provider: Hillard [name] Other Clinician: Referring Provider: Treating Provider/Extender: Kizzie Furnish Weeks in Treatment: 3 Active Problems ICD-10 Encounter Code Description Active Date MDM Diagnosis L97.518 Non-pressure chronic ulcer of other part of right foot with other specified 12/10/2022 No Yes severity BORGHILD, WIDDER ([id number]) [id number] Page 6 of 11 L97.528 Non-pressure chronic ulcer of other part of left foot with other specified 12/10/2022 No Yes severity E11.621 Type 2 diabetes mellitus with foot ulcer 12/10/2022 No Yes F03.90 Unspecified dementia, unspecified severity, without behavioral disturbance, 12/10/2022 No Yes psychotic disturbance, mood disturbance, and anxiety I73.9 Peripheral vascular disease, unspecified 12/10/2022 No Yes Inactive Problems Resolved Problems Electronic Signature(s) Signed: 01/04/2023 12:26:35 PM By: [name] Corwin DO Entered By: [name] [name] on 12/31/2022 14:47:37 -------------------------------------------------------------------------------- Progress Note Details Patient Name: Date of Service: [name] [name], Affie M. 12/31/2022 2:15 PM Medical Record Number: [id number] Patient Account Number: [id number] Date of Birth/Sex: Treating [name]: 1933/08/08 (89 y.o. F) Primary Care Provider: Hillard [name] Other Clinician: Referring Provider: Treating Provider/Extender: Kizzie Furnish Weeks in Treatment: 3 Subjective Chief Complaint Information obtained from Patient 12/10/2022; small open wounds to the feet bilaterally History of Present Illness (HPI) 12/10/2022 Ms. Charmika Mervine is an 89 year old female with a past medical history of PAD, dementia, uncontrolled type 2 diabetes on insulin that presents the clinic for a 1 year history of wounds to her feet bilaterally. Daughter is present and helps provide the history. How the wounds have started is unclear. She has been using surgical shoes to offload the wounds. She has been using Santyl to the wound beds. She has slight erythema that  daughter states is new around the periwound on the right and left great toe. She has a history of PAD and is followed with vein and vascular for this last seeing them on 10/2019. She had no wounds at that time but ABIs showed moderate right infrapopliteal artery occlusive disease and moderate multisegmental left lower extremity arterial occlusive disease. Patient has chronic pain to the wound sites. 6/4; send patient with wounds at the tip of her left and right first toes and the left medial MTP. We have been using Santyl to the wounds. 6/13; patient presents for follow-up. Patient has been using Santyl and Hydrofera Blue. She has no issues or complaints. She is scheduled to see vein and vascular on 7/10. Patient History Information obtained from Caregiver, Chart. Family History Cancer - Siblings, Diabetes - Mother,Siblings, Hypertension - Mother, Lung Disease - Father. Social History Never smoker, Marital Status - Widowed, Alcohol Use - Never, Drug Use - No History, Caffeine Use - Rarely - coffee. Medical History Cardiovascular Patient has history of Hypertension, Hypotension - orthostatic, Peripheral Arterial Disease, Peripheral Venous Disease Gastrointestinal Patient has history of Colitis [name] [name], [name] [name] ([id number]) [id number] Page 7 of 11 Endocrine Patient has history of Type II Diabetes Neurologic Patient has history of Dementia, Seizure Disorder Hospitalization/Surgery History - loop recorder insertion 2021. - 2018 laparotomy. - abdominal hysterectomy. - knee arthroscopy. - vocal cord polyps. Medical A Surgical History Notes nd Ear/Nose/Mouth/Throat dysphagia Cardiovascular hyperlipidemia, Gastrointestinal GERD viral gastroenteritis small bower  obstruction 2018 Endocrine hypothyroidism, Genitourinary CKD II urinary retention Integumentary (Skin) eczema Musculoskeletal degenerative joint disease osteoporosis Neurologic delirium  encephalopathy Objective Constitutional respirations regular, non-labored and within target range for patient.. Vitals Time Taken: 2:19 PM, Height: 66 in, Weight: 110 lbs, BMI: 17.8, Temperature: 97.7 F, Pulse: 60 bpm, Respiratory Rate: 16 breaths/min, Blood Pressure: 151/74 mmHg. Cardiovascular 2+ dorsalis pedis/posterior tibialis pulses. Psychiatric pleasant and cooperative. General Notes: Wounds to the distal aspect of the great toes with nonviable surface R>L. T the medial left foot over a bunion there is a small open wound o with No increased warmth, erythema or purulent drainage. Integumentary (Hair, Skin) Wound #3 status is Open. Original cause of wound was Gradually Appeared. The date acquired was: 12/09/2021. The wound has been in treatment 3 weeks. The wound is located on the Right,Distal T [place]. The wound measures 0.5cm length x 0.7cm width x 0.1cm depth; 0.275cm^2 area and 0.027cm^3 volume. There oe is Fat Layer (Subcutaneous Tissue) exposed. There is no tunneling or undermining noted. There is a medium amount of serosanguineous drainage noted. The wound margin is fibrotic, thickened scar. There is no granulation within the wound bed. There is a large (67-100%) amount of necrotic tissue within the wound bed including Adherent Slough. The periwound skin appearance had no abnormalities noted for texture. The periwound skin appearance had no abnormalities noted for moisture. The periwound skin appearance had no abnormalities noted for color. Periwound temperature was noted as No Abnormality. Wound #4 status is Open. Original cause of wound was Gradually Appeared. The date acquired was: 12/09/2021. The wound has been in treatment 3 weeks. The wound is located on the Left,Medial Foot. The wound measures 0.2cm length x 0.2cm width x 0.1cm depth; 0.031cm^2 area and 0.003cm^3 volume. There is no tunneling or undermining noted. There is a none present amount of drainage noted. The wound  margin is distinct with the outline attached to the wound base. There is no granulation within the wound bed. There is a large (67-100%) amount of necrotic tissue within the wound bed including Adherent Slough. The periwound skin appearance had no abnormalities noted for texture. The periwound skin appearance had no abnormalities noted for moisture. The periwound skin appearance had no abnormalities noted for color. Periwound temperature was noted as No Abnormality. Wound #5 status is Open. Original cause of wound was Gradually Appeared. The date acquired was: 12/09/2021. The wound has been in treatment 3 weeks. The wound is located on the Left T Great. The wound measures 0.2cm length x 0.2cm width x 0.1cm depth; 0.031cm^2 area and 0.003cm^3 volume. There is Fat oe Layer (Subcutaneous Tissue) exposed. There is no tunneling or undermining noted. There is a none present amount of drainage noted. The wound margin is distinct with the outline attached to the wound base. There is no granulation within the wound bed. There is a large (67-100%) amount of necrotic tissue within the wound bed including Adherent Slough. The periwound skin appearance had no abnormalities noted for texture. The periwound skin appearance had no abnormalities noted for moisture. The periwound skin appearance had no abnormalities noted for color. Periwound temperature was noted as No Abnormality. Assessment Active Problems ICD-10 Non-pressure chronic ulcer of other part of right foot with other specified severity Non-pressure chronic ulcer of other part of left foot with other specified severity Type 2 diabetes mellitus with foot ulcer Unspecified dementia, unspecified severity, without behavioral disturbance, psychotic disturbance, mood disturbance, and anxiety Peripheral vascular disease, unspecified MARGARETMARY, [name] [name] ([id number]) [id number] Page  8 of 11 Wounds are stable. I recommended continuing  Santyl and Hydrofera Blue. She is scheduled to see vein and vascular next month to assess blood flow status. Follow-up in 2 weeks. Procedures Wound #3 Pre-procedure diagnosis of Wound #3 is a Diabetic Wound/Ulcer of the Lower Extremity located on the Right,Distal T Great .Severity of Tissue Pre oe Debridement is: Limited to breakdown of skin. There was a Chemical/Enzymatic/Mechanical debridement performed by [name] Stall, [name].Marland Kitchen Agent used was [employer]. There was no bleeding. The procedure was tolerated well. Post Debridement Measurements: 0.5cm length x 0.7cm width x 0.1cm depth; 0.027cm^3 volume. Character of Wound/Ulcer Post Debridement requires further debridement. Severity of Tissue Post Debridement is: Limited to breakdown of skin. Post procedure Diagnosis Wound #3: Same as Pre-Procedure Wound #4 Pre-procedure diagnosis of Wound #4 is a Diabetic Wound/Ulcer of the Lower Extremity located on the Left,Medial Foot .Severity of Tissue Pre Debridement is: Limited to breakdown of skin. There was a Chemical/Enzymatic/Mechanical debridement performed by [name] Stall, [name].Marland Kitchen Agent used was [employer]. There was no bleeding. The procedure was tolerated well. Post Debridement Measurements: 0.2cm length x 0.2cm width x 0.1cm depth; 0.003cm^3 volume. Character of Wound/Ulcer Post Debridement requires further debridement. Severity of Tissue Post Debridement is: Limited to breakdown of skin. Post procedure Diagnosis Wound #4: Same as Pre-Procedure Wound #5 Pre-procedure diagnosis of Wound #5 is a Diabetic Wound/Ulcer of the Lower Extremity located on the Left T Great .Severity of Tissue Pre Debridement is: oe Limited to breakdown of skin. There was a Chemical/Enzymatic/Mechanical debridement performed by [name] Stall, [name].Marland Kitchen Agent used was [employer]. There was no bleeding. The procedure was tolerated well. Post Debridement Measurements: 0.2cm length x 0.2cm width x 0.1cm depth; 0.003cm^3 volume. Character of  Wound/Ulcer Post Debridement requires further debridement. Severity of Tissue Post Debridement is: Limited to breakdown of skin. Post procedure Diagnosis Wound #5: Same as Pre-Procedure Plan Follow-up Appointments: Return Appointment in 2 weeks. - Dr. Mikey Bussing Monday 01/18/2023 3pm room 8 Other: - follow up with Vein and Vascular 01/27/2023 Anesthetic: (In clinic) Topical Lidocaine 4% applied to wound bed Bathing/ Shower/ Hygiene: Other Bathing/Shower/Hygiene Orders/Instructions: - Bathe feet with soap and water as tolerated. (please do not soak the feet) Off-Loading: Other: - Float feet with a pillow under the calf while in bed Home Health: New wound care orders this week; continue Home Health for wound care. May utilize formulary equivalent dressing for wound treatment orders unless otherwise specified. - Please use Santyl on wound bed and then place Hydrafera Blue Ready on Santyl for the 3 wounds. Dressing changes to be completed by Home Health on Monday / Wednesday / Friday except when patient has scheduled visit at [facility]. - Or 3 times a week for Home Health Other Home Health Orders/Instructions: - [place] WOUND #3: - T Great Wound Laterality: Right, Distal oe Cleanser: Soap and Water 1 x Per Day/30 Days Discharge Instructions: May shower and wash wound with dial antibacterial soap and water prior to dressing change. Cleanser: Wound Cleanser 1 x Per Day/30 Days Discharge Instructions: Cleanse the wound with wound cleanser prior to applying a clean dressing using gauze sponges, not tissue or cotton balls. Prim Dressing: Hydrofera Blue Ready Transfer Foam, 2.5x2.5 (in/in) 1 x Per Day/30 Days ary Discharge Instructions: Apply directly to wound bed as directed Prim Dressing: Santyl Ointment 1 x Per Day/30 Days ary Discharge Instructions: Apply nickel thick amount to wound bed as instructed Secured With: Conforming Stretch Gauze Bandage, Sterile 2x75 (in/in) 1 x Per Day/30  Days Discharge Instructions: Secure with stretch gauze as directed. Secured With: 27M Medipore Scientist, research (life sciences) Surgical T 2x10 (in/yd) 1 x Per Day/30 Days ape Discharge Instructions: Secure with tape as directed. WOUND #4: - Foot Wound Laterality: Left, Medial Cleanser: Soap and Water 1 x Per Day/30 Days Discharge Instructions: May shower and wash wound with dial antibacterial soap and water prior to dressing change. Cleanser: Wound Cleanser 1 x Per Day/30 Days Discharge Instructions: Cleanse the wound with wound cleanser prior to applying a clean dressing using gauze sponges, not tissue or cotton balls. Prim Dressing: Hydrofera Blue Ready Transfer Foam, 2.5x2.5 (in/in) 1 x Per Day/30 Days ary Discharge Instructions: Apply directly to wound bed as directed Prim Dressing: Santyl Ointment 1 x Per Day/30 Days ary Discharge Instructions: Apply nickel thick amount to wound bed as instructed Secondary Dressing: Bordered Gauze, 2x2 in 1 x Per Day/30 Days Discharge Instructions: Apply over primary dressing as directed. WOUND #5: - T Great Wound Laterality: Left oe Cleanser: Soap and Water 1 x Per Day/30 Days Discharge Instructions: May shower and wash wound with dial antibacterial soap and water prior to dressing change. Cleanser: Wound Cleanser 1 x Per Day/30 Days Discharge Instructions: Cleanse the wound with wound cleanser prior to applying a clean dressing using gauze sponges, not tissue or cotton balls. Prim Dressing: Hydrofera Blue Ready Transfer Foam, 2.5x2.5 (in/in) 1 x Per Day/30 Days ary Discharge Instructions: Apply directly to wound bed as directed Prim Dressing: Santyl Ointment 1 x Per Day/30 Days ary Discharge Instructions: Apply nickel thick amount to wound bed as instructed [name] [name], [name] [name] ([id number]) [id number] Page 9 of 11 Secured With: Insurance underwriter, Sterile 2x75 (in/in) 1 x Per Day/30 Days Discharge Instructions: Secure with  stretch gauze as directed. Secured With: 27M Medipore Scientist, research (life sciences) Surgical T 2x10 (in/yd) 1 x Per Day/30 Days ape Discharge Instructions: Secure with tape as directed. 1. Santyl and Hydrofera Blue 2. Aggressive offloadingsurgical shoes 3. Follow-up in 2 weeks Electronic Signature(s) Signed: 01/04/2023 12:26:35 PM By: [name] Corwin DO Entered By: [name] [name] on 12/31/2022 14:52:47 -------------------------------------------------------------------------------- HxROS Details Patient Name: Date of Service: [name] [name], [name] [name] M. 12/31/2022 2:15 PM Medical Record Number: [id number] Patient Account Number: [id number] Date of Birth/Sex: Treating [name]: Jul 17, 1934 (89 y.o. F) Primary Care Provider: Hillard [name] Other Clinician: Referring Provider: Treating Provider/Extender: Kizzie Furnish Weeks in Treatment: 3 Information Obtained From Caregiver Chart Ear/Nose/Mouth/Throat Medical History: Past Medical History Notes: dysphagia Cardiovascular Medical History: Positive for: Hypertension; Hypotension - orthostatic; Peripheral Arterial Disease; Peripheral Venous Disease Past Medical History Notes: hyperlipidemia, Gastrointestinal Medical History: Positive for: Colitis Past Medical History Notes: GERD viral gastroenteritis small bower obstruction 2018 Endocrine Medical History: Positive for: Type II Diabetes Past Medical History Notes: hypothyroidism, Time with diabetes: 40 years Treated with: Insulin Blood sugar tested every day: Yes Tested : 3 x day Blood sugar testing results: Breakfast: 200 Genitourinary Medical History: Past Medical History Notes: CKD II urinary retention Integumentary (Skin) AYDIN, SEFTON ([id number]) [id number] Page 10 of 11 Medical History: Past Medical History Notes: eczema Musculoskeletal Medical History: Past Medical History Notes: degenerative joint disease osteoporosis Neurologic Medical  History: Positive for: Dementia; Seizure Disorder Past Medical History Notes: delirium encephalopathy Immunizations Pneumococcal Vaccine: Received Pneumococcal Vaccination: Yes Received Pneumococcal Vaccination On or After 60th Birthday: Yes Implantable Devices Yes Hospitalization / Surgery History Type of Hospitalization/Surgery loop recorder insertion 2021 2018 laparotomy abdominal hysterectomy knee arthroscopy vocal cord polyps Family and Social History Cancer: Yes - Siblings; Diabetes: Yes -  Mother,Siblings; Hypertension: Yes - Mother; Lung Disease: Yes - Father; Never smoker; Marital Status - Widowed; Alcohol Use: Never; Drug Use: No History; Caffeine Use: Rarely - coffee; Financial Concerns: No; Food, Clothing or Shelter Needs: No; Support System Lacking: No; Transportation Concerns: No Electronic Signature(s) Signed: 01/04/2023 12:26:35 PM By: [name] Corwin DO Entered By: [name] [name] on 12/31/2022 14:49:41 -------------------------------------------------------------------------------- SuperBill Details Patient Name: Date of Service: [name] [name], Lorraina M. 12/31/2022 Medical Record Number: [id number] Patient Account Number: [id number] Date of Birth/Sex: Treating [name]: [birth year] (89 y.o. [name], [name] Primary Care Provider: Hillard [name] Other Clinician: Referring Provider: Treating Provider/Extender: Kizzie Furnish Weeks in Treatment: 3 Diagnosis Coding ICD-10 Codes Code Description [phone] Non-pressure chronic ulcer of other part of right foot with other specified severity L97.528 Non-pressure chronic ulcer of other part of left foot with other specified severity E11.621 Type 2 diabetes mellitus with foot ulcer F03.90 Unspecified dementia, unspecified severity, without behavioral disturbance, psychotic disturbance, mood disturbance, and anxiety I73.9 Peripheral vascular disease, unspecified Facility Procedures : [place], [facility] CPT4 CodeMelina Copa  ([id number]) [id number] Description: [phone] 2 - DEBRIDE W/O ANES NON SELECT Modifier: [id number] [id number] Quantity: pdf Page 11 of 11 Physician Procedures : CPT4 Code Description Modifier [id number] - WC PHYS LEVEL 3 - EST PT ICD-10 Diagnosis Description L97.518 Non-pressure chronic ulcer of other part of right foot with other specified severity L97.528 Non-pressure chronic ulcer of other part of left  foot with other specified severity E11.621 Type 2 diabetes mellitus with foot ulcer I73.9 Peripheral vascular disease, unspecified Quantity: 1 Electronic Signature(s) Signed: 01/04/2023 12:26:35 PM By: [name] Corwin DO Entered By: [name] [name] on 12/31/2022 14:53:04

## 2023-01-04 NOTE — ED Triage Notes (Signed)
Ems reports patient coming from home.  Appetite has decreased since Friday.'  Reporting :Weakness HX of dementia . BP 136/90 Temp 98.3 [id number] 80 HR

## 2023-01-05 DIAGNOSIS — L97528 Non-pressure chronic ulcer of other part of left foot with other specified severity: Secondary | ICD-10-CM | POA: Diagnosis not present

## 2023-01-05 DIAGNOSIS — N3000 Acute cystitis without hematuria: Secondary | ICD-10-CM | POA: Diagnosis not present

## 2023-01-05 DIAGNOSIS — D631 Anemia in chronic kidney disease: Secondary | ICD-10-CM | POA: Diagnosis not present

## 2023-01-05 DIAGNOSIS — L97518 Non-pressure chronic ulcer of other part of right foot with other specified severity: Secondary | ICD-10-CM | POA: Diagnosis not present

## 2023-01-05 DIAGNOSIS — I152 Hypertension secondary to endocrine disorders: Secondary | ICD-10-CM | POA: Diagnosis not present

## 2023-01-05 DIAGNOSIS — L89896 Pressure-induced deep tissue damage of other site: Secondary | ICD-10-CM | POA: Diagnosis not present

## 2023-01-05 DIAGNOSIS — E1122 Type 2 diabetes mellitus with diabetic chronic kidney disease: Secondary | ICD-10-CM | POA: Diagnosis not present

## 2023-01-05 DIAGNOSIS — E11621 Type 2 diabetes mellitus with foot ulcer: Secondary | ICD-10-CM | POA: Diagnosis not present

## 2023-01-05 DIAGNOSIS — L89152 Pressure ulcer of sacral region, stage 2: Secondary | ICD-10-CM | POA: Diagnosis not present

## 2023-01-05 DIAGNOSIS — E1159 Type 2 diabetes mellitus with other circulatory complications: Secondary | ICD-10-CM | POA: Diagnosis not present

## 2023-01-05 DIAGNOSIS — N182 Chronic kidney disease, stage 2 (mild): Secondary | ICD-10-CM | POA: Diagnosis not present

## 2023-01-06 ENCOUNTER — Institutional Professional Consult (permissible substitution): Payer: Medicare HMO | Admitting: Diagnostic Neuroimaging

## 2023-01-06 ENCOUNTER — Encounter: Payer: Self-pay | Admitting: Diagnostic Neuroimaging

## 2023-01-08 DIAGNOSIS — E11621 Type 2 diabetes mellitus with foot ulcer: Secondary | ICD-10-CM | POA: Diagnosis not present

## 2023-01-08 DIAGNOSIS — L97528 Non-pressure chronic ulcer of other part of left foot with other specified severity: Secondary | ICD-10-CM | POA: Diagnosis not present

## 2023-01-08 DIAGNOSIS — E1122 Type 2 diabetes mellitus with diabetic chronic kidney disease: Secondary | ICD-10-CM | POA: Diagnosis not present

## 2023-01-08 DIAGNOSIS — D631 Anemia in chronic kidney disease: Secondary | ICD-10-CM | POA: Diagnosis not present

## 2023-01-08 DIAGNOSIS — L97518 Non-pressure chronic ulcer of other part of right foot with other specified severity: Secondary | ICD-10-CM | POA: Diagnosis not present

## 2023-01-08 DIAGNOSIS — L89152 Pressure ulcer of sacral region, stage 2: Secondary | ICD-10-CM | POA: Diagnosis not present

## 2023-01-08 DIAGNOSIS — E1159 Type 2 diabetes mellitus with other circulatory complications: Secondary | ICD-10-CM | POA: Diagnosis not present

## 2023-01-08 DIAGNOSIS — I152 Hypertension secondary to endocrine disorders: Secondary | ICD-10-CM | POA: Diagnosis not present

## 2023-01-08 DIAGNOSIS — L89896 Pressure-induced deep tissue damage of other site: Secondary | ICD-10-CM | POA: Diagnosis not present

## 2023-01-08 DIAGNOSIS — N182 Chronic kidney disease, stage 2 (mild): Secondary | ICD-10-CM | POA: Diagnosis not present

## 2023-01-11 DIAGNOSIS — I152 Hypertension secondary to endocrine disorders: Secondary | ICD-10-CM | POA: Diagnosis not present

## 2023-01-11 DIAGNOSIS — L89152 Pressure ulcer of sacral region, stage 2: Secondary | ICD-10-CM | POA: Diagnosis not present

## 2023-01-11 DIAGNOSIS — L97528 Non-pressure chronic ulcer of other part of left foot with other specified severity: Secondary | ICD-10-CM | POA: Diagnosis not present

## 2023-01-11 DIAGNOSIS — E1159 Type 2 diabetes mellitus with other circulatory complications: Secondary | ICD-10-CM | POA: Diagnosis not present

## 2023-01-11 DIAGNOSIS — E11621 Type 2 diabetes mellitus with foot ulcer: Secondary | ICD-10-CM | POA: Diagnosis not present

## 2023-01-11 DIAGNOSIS — N182 Chronic kidney disease, stage 2 (mild): Secondary | ICD-10-CM | POA: Diagnosis not present

## 2023-01-11 DIAGNOSIS — D631 Anemia in chronic kidney disease: Secondary | ICD-10-CM | POA: Diagnosis not present

## 2023-01-11 DIAGNOSIS — L89896 Pressure-induced deep tissue damage of other site: Secondary | ICD-10-CM | POA: Diagnosis not present

## 2023-01-11 DIAGNOSIS — L97518 Non-pressure chronic ulcer of other part of right foot with other specified severity: Secondary | ICD-10-CM | POA: Diagnosis not present

## 2023-01-11 DIAGNOSIS — E1122 Type 2 diabetes mellitus with diabetic chronic kidney disease: Secondary | ICD-10-CM | POA: Diagnosis not present

## 2023-01-12 ENCOUNTER — Other Ambulatory Visit: Payer: Self-pay | Admitting: *Deleted

## 2023-01-12 DIAGNOSIS — E1159 Type 2 diabetes mellitus with other circulatory complications: Secondary | ICD-10-CM | POA: Diagnosis not present

## 2023-01-12 DIAGNOSIS — E871 Hypo-osmolality and hyponatremia: Secondary | ICD-10-CM | POA: Diagnosis not present

## 2023-01-12 DIAGNOSIS — N390 Urinary tract infection, site not specified: Secondary | ICD-10-CM | POA: Diagnosis not present

## 2023-01-12 DIAGNOSIS — L97528 Non-pressure chronic ulcer of other part of left foot with other specified severity: Secondary | ICD-10-CM | POA: Diagnosis not present

## 2023-01-12 DIAGNOSIS — E11621 Type 2 diabetes mellitus with foot ulcer: Secondary | ICD-10-CM | POA: Diagnosis not present

## 2023-01-12 DIAGNOSIS — I152 Hypertension secondary to endocrine disorders: Secondary | ICD-10-CM | POA: Diagnosis not present

## 2023-01-12 DIAGNOSIS — L89896 Pressure-induced deep tissue damage of other site: Secondary | ICD-10-CM | POA: Diagnosis not present

## 2023-01-12 DIAGNOSIS — D631 Anemia in chronic kidney disease: Secondary | ICD-10-CM | POA: Diagnosis not present

## 2023-01-12 DIAGNOSIS — E1122 Type 2 diabetes mellitus with diabetic chronic kidney disease: Secondary | ICD-10-CM | POA: Diagnosis not present

## 2023-01-12 DIAGNOSIS — N182 Chronic kidney disease, stage 2 (mild): Secondary | ICD-10-CM | POA: Diagnosis not present

## 2023-01-12 DIAGNOSIS — I739 Peripheral vascular disease, unspecified: Secondary | ICD-10-CM

## 2023-01-12 DIAGNOSIS — E860 Dehydration: Secondary | ICD-10-CM | POA: Diagnosis not present

## 2023-01-12 DIAGNOSIS — L89152 Pressure ulcer of sacral region, stage 2: Secondary | ICD-10-CM | POA: Diagnosis not present

## 2023-01-12 DIAGNOSIS — L97518 Non-pressure chronic ulcer of other part of right foot with other specified severity: Secondary | ICD-10-CM | POA: Diagnosis not present

## 2023-01-14 DIAGNOSIS — E11621 Type 2 diabetes mellitus with foot ulcer: Secondary | ICD-10-CM | POA: Diagnosis not present

## 2023-01-14 DIAGNOSIS — E1122 Type 2 diabetes mellitus with diabetic chronic kidney disease: Secondary | ICD-10-CM | POA: Diagnosis not present

## 2023-01-14 DIAGNOSIS — I152 Hypertension secondary to endocrine disorders: Secondary | ICD-10-CM | POA: Diagnosis not present

## 2023-01-14 DIAGNOSIS — L89896 Pressure-induced deep tissue damage of other site: Secondary | ICD-10-CM | POA: Diagnosis not present

## 2023-01-14 DIAGNOSIS — L97518 Non-pressure chronic ulcer of other part of right foot with other specified severity: Secondary | ICD-10-CM | POA: Diagnosis not present

## 2023-01-14 DIAGNOSIS — D631 Anemia in chronic kidney disease: Secondary | ICD-10-CM | POA: Diagnosis not present

## 2023-01-14 DIAGNOSIS — E1159 Type 2 diabetes mellitus with other circulatory complications: Secondary | ICD-10-CM | POA: Diagnosis not present

## 2023-01-14 DIAGNOSIS — L89152 Pressure ulcer of sacral region, stage 2: Secondary | ICD-10-CM | POA: Diagnosis not present

## 2023-01-14 DIAGNOSIS — N182 Chronic kidney disease, stage 2 (mild): Secondary | ICD-10-CM | POA: Diagnosis not present

## 2023-01-14 DIAGNOSIS — L97528 Non-pressure chronic ulcer of other part of left foot with other specified severity: Secondary | ICD-10-CM | POA: Diagnosis not present

## 2023-01-16 DIAGNOSIS — R54 Age-related physical debility: Secondary | ICD-10-CM | POA: Diagnosis not present

## 2023-01-16 DIAGNOSIS — M810 Age-related osteoporosis without current pathological fracture: Secondary | ICD-10-CM | POA: Diagnosis not present

## 2023-01-16 DIAGNOSIS — M3390 Dermatopolymyositis, unspecified, organ involvement unspecified: Secondary | ICD-10-CM | POA: Diagnosis not present

## 2023-01-18 ENCOUNTER — Encounter (HOSPITAL_BASED_OUTPATIENT_CLINIC_OR_DEPARTMENT_OTHER): Payer: Medicare HMO | Admitting: Internal Medicine

## 2023-01-18 NOTE — Progress Notes (Signed)
[name] [name], CABANILLA ([id number]) [id number] Page 1 of 12 Visit Report for 12/10/2022 Chief Complaint Document Details Patient Name: Date of Service: [name] TAVONNA, RULLI. 12/10/2022 8:00 A M Medical Record Number: [id number] Patient Account Number: [id number] Date of Birth/Sex: Treating RN: Sep 06, 1933 (89 y.o. F) Primary Care Provider: Hillard Danker Other Clinician: Referring Provider: Treating Provider/Extender: Kizzie Furnish Weeks in Treatment: 0 Information Obtained from: Patient Chief Complaint 12/10/2022; small open wounds to the feet bilaterally Electronic Signature(s) Signed: 12/10/2022 12:56:08 PM By: Geralyn Corwin DO Entered By: Geralyn Corwin on 12/10/2022 10:20:38 -------------------------------------------------------------------------------- Debridement Details Patient Name: Date of Service: [name] RRIS, [name] 12/10/2022 8:00 A M Medical Record Number: [id number] Patient Account Number: [id number] Date of Birth/Sex: Treating RN: 03/10/1934 (89 y.o. Katrinka Blazing Primary Care Provider: Hillard Danker Other Clinician: Referring Provider: Treating Provider/Extender: Kizzie Furnish Weeks in Treatment: 0 Debridement Performed for Assessment: Wound #3 Right,Distal T Great oe Performed By: Physician Geralyn Corwin, DO Debridement Type: Chemical/Enzymatic/Mechanical Agent Used: Santyl Severity of Tissue Pre Debridement: Fat layer exposed Level of Consciousness (Pre-procedure): Awake and Alert Pre-procedure Verification/Time Out Yes - 09:35 Taken: Start Time: 09:35 Pain Control: Lidocaine 5% topical ointment Percent of Wound Bed Debrided: Instrument: Other : No instrument Bleeding: None End Time: 09:37 Procedural Pain: 0 Post Procedural Pain: 0 Response to Treatment: Procedure was tolerated well Level of Consciousness (Post- Awake and Alert procedure): Post Debridement Measurements of Total Wound Length: (cm)  0.8 Width: (cm) 1 Depth: (cm) 0.1 Volume: (cm) 0.063 Character of Wound/Ulcer Post Debridement: Improved Severity of Tissue Post Debridement: Fat layer exposed Post Procedure Diagnosis Same as Pre-procedure VINNIE, CARDOSA ([id number]) [id number] Page 2 of 12 Notes Scribed for Dr. Mikey Bussing by J.S. Electronic Signature(s) Signed: 12/10/2022 12:56:08 PM By: Geralyn Corwin DO Signed: 12/10/2022 6:34:43 PM By: Karie Schwalbe RN Entered By: Karie Schwalbe on 12/10/2022 09:39:38 -------------------------------------------------------------------------------- Debridement Details Patient Name: Date of Service: [name] Spruce M. 12/10/2022 8:00 A M Medical Record Number: [id number] Patient Account Number: [id number] Date of Birth/Sex: Treating RN: 1934-03-31 (89 y.o. Katrinka Blazing Primary Care Provider: Hillard Danker Other Clinician: Referring Provider: Treating Provider/Extender: Kizzie Furnish Weeks in Treatment: 0 Debridement Performed for Assessment: Wound #4 Left,Medial Foot Performed By: Physician Geralyn Corwin, DO Debridement Type: Chemical/Enzymatic/Mechanical Agent Used: Santyl Severity of Tissue Pre Debridement: Fat layer exposed Level of Consciousness (Pre-procedure): Awake and Alert Pre-procedure Verification/Time Out Yes - 09:35 Taken: Start Time: 09:35 Pain Control: Lidocaine 5% topical ointment Percent of Wound Bed Debrided: Instrument: Other : No instrument Bleeding: None End Time: 09:37 Procedural Pain: 0 Post Procedural Pain: 0 Response to Treatment: Procedure was tolerated well Level of Consciousness (Post- Awake and Alert procedure): Post Debridement Measurements of Total Wound Length: (cm) 0.4 Width: (cm) 0.3 Depth: (cm) 0.1 Volume: (cm) 0.009 Character of Wound/Ulcer Post Debridement: Improved Severity of Tissue Post Debridement: Fat layer exposed Post Procedure Diagnosis Same as  Pre-procedure Notes Scribed for Dr. Mikey Bussing by J.S. Electronic Signature(s) Signed: 12/10/2022 12:56:08 PM By: Geralyn Corwin DO Signed: 12/10/2022 6:34:43 PM By: Karie Schwalbe RN Entered By: Karie Schwalbe on 12/10/2022 09:40:33 Debridement Details -------------------------------------------------------------------------------- Dianah Field ([id number]) [id number] Page 3 of 12 Patient Name: Date of Service: [name] [name], [name] [name] 12/10/2022 8:00 A M Medical Record Number: [id number] Patient Account Number: [id number] Date of Birth/Sex: Treating RN: Oct 31, 1933 (89 y.o. Katrinka Blazing Primary Care Provider: Hillard Danker Other Clinician: Referring Provider: Treating Provider/Extender: Kizzie Furnish Weeks in Treatment: 0 Debridement Performed for  Assessment: Wound #5 Left T Great oe Performed By: Physician Geralyn Corwin, DO Debridement Type: Chemical/Enzymatic/Mechanical Agent Used: Santyl Severity of Tissue Pre Debridement: Limited to breakdown of skin Level of Consciousness (Pre-procedure): Awake and Alert Pre-procedure Verification/Time Out Yes - 09:35 Taken: Start Time: 09:35 Pain Control: Lidocaine 5% topical ointment Percent of Wound Bed Debrided: Instrument: Other : No instrument Bleeding: None End Time: 09:37 Procedural Pain: 0 Post Procedural Pain: 0 Response to Treatment: Procedure was tolerated well Level of Consciousness (Post- Awake and Alert procedure): Post Debridement Measurements of Total Wound Length: (cm) 0.4 Width: (cm) 0.4 Depth: (cm) 0.1 Volume: (cm) 0.013 Character of Wound/Ulcer Post Debridement: Improved Severity of Tissue Post Debridement: Fat layer exposed Post Procedure Diagnosis Same as Pre-procedure Notes Scribed for Dr. Mikey Bussing by J.Scotton Electronic Signature(s) Signed: 12/10/2022 12:56:08 PM By: Geralyn Corwin DO Signed: 12/10/2022 6:34:43 PM By: Karie Schwalbe RN Entered By:  Karie Schwalbe on 12/10/2022 09:41:21 -------------------------------------------------------------------------------- HPI Details Patient Name: Date of Service: [name] RRIS, Priscilla M. 12/10/2022 8:00 A M Medical Record Number: [id number] Patient Account Number: [id number] Date of Birth/Sex: Treating RN: 08/17/1933 (89 y.o. F) Primary Care Provider: Hillard Danker Other Clinician: Referring Provider: Treating Provider/Extender: Kizzie Furnish Weeks in Treatment: 0 History of Present Illness HPI Description: 12/10/2022 Ms. Adeana Jaglowski is an 89 year old female with a past medical history of PAD, dementia, uncontrolled type 2 diabetes on insulin that presents the clinic for a 1 year history of wounds to her feet bilaterally. Daughter is present and helps provide the history. How the wounds have started is unclear. She has been using surgical shoes to offload the wounds. She has been using Santyl to the wound beds. She has slight erythema that daughter states is new around the periwound on the right and left great toe. She has a history of PAD and is followed with vein and vascular for this last seeing them on 10/2019. She had no wounds at that time but ABIs showed moderate right infrapopliteal artery occlusive disease and moderate multisegmental left lower extremity arterial occlusive disease. Patient has chronic pain to the wound sites. Electronic Signature(s) Signed: 12/10/2022 12:56:08 PM By: Geralyn Corwin DO Losurdo, BESSIE5/23/2024 12:56:08 PM By: Geralyn Corwin DO SignedJudie [name] ([id number]) [id number] Page 4 of 12 Entered By: Geralyn Corwin on 12/10/2022 10:25:37 -------------------------------------------------------------------------------- Physical Exam Details Patient Name: Date of Service: [name] EMMER, LOWMASTER 12/10/2022 8:00 A M Medical Record Number: [id number] Patient Account Number: [id number] Date of Birth/Sex: Treating RN: Feb 20, 1934 (89 y.o.  F) Primary Care Provider: Hillard Danker Other Clinician: Referring Provider: Treating Provider/Extender: Kizzie Furnish Weeks in Treatment: 0 Constitutional respirations regular, non-labored and within target range for patient.. Cardiovascular 2+ dorsalis pedis/posterior tibialis pulses. Psychiatric pleasant and cooperative. Notes Wounds to the distal aspect of the great toes with nonviable surface. T the medial left foot over a bunion there is a small open wound with fibrinous tissue. o Mild erythema to the periwounds. No increased warmth or purulent drainage. Electronic Signature(s) Signed: 12/10/2022 12:56:08 PM By: Geralyn Corwin DO Entered By: Geralyn Corwin on 12/10/2022 10:27:19 -------------------------------------------------------------------------------- Physician Orders Details Patient Name: Date of Service: [name] RRIS, Elbert M. 12/10/2022 8:00 A M Medical Record Number: [id number] Patient Account Number: [id number] Date of Birth/Sex: Treating RN: 11/22/1933 (89 y.o. Katrinka Blazing Primary Care Provider: Hillard Danker Other Clinician: Referring Provider: Treating Provider/Extender: Kizzie Furnish Weeks in Treatment: 0 Verbal / Phone Orders: No Diagnosis Coding ICD-10 Coding Code Description L97.518 Non-pressure chronic ulcer of other part  of right foot with other specified severity L97.528 Non-pressure chronic ulcer of other part of left foot with other specified severity E11.621 Type 2 diabetes mellitus with foot ulcer Follow-up Appointments ppointment in 1 week. - +++ Extra Time++++Dr. Mikey Bussing Room 8 Return A Other: - ++ Remember to pick up antibiotics from Pharmacy+++ Anesthetic (In clinic) Topical Lidocaine 5% applied to wound bed - Used in Clinic Prior to debridement (In clinic) Topical Lidocaine 4% applied to wound bed Bathing/ Shower/ Hygiene Other Bathing/Shower/Hygiene Orders/Instructions: - Bathe feet with soap and water as  tolerated. (please do not soak the feet) Off-Loading IDOLINA, KAPPENMAN ([id number]) [id number] Page 5 of 12 Other: - Float feet with a pillow under the calf while in bed Home Health New wound care orders this week; continue Home Health for wound care. May utilize formulary equivalent dressing for wound treatment orders unless otherwise specified. - Please use Santyl on wound bed and then place Hydrafera Blue Ready on Santyl for the 3 wounds. Dressing changes to be completed by Home Health on Monday / Wednesday / Friday except when patient has scheduled visit at [facility]. - Or 3 times a week for Home Health Other Home Health Orders/Instructions: - [facility] Wound #3 - T Great oe Wound Laterality: Right, Distal Cleanser: Soap and Water 1 x Per Day/30 Days Discharge Instructions: May shower and wash wound with dial antibacterial soap and water prior to dressing change. Cleanser: Wound Cleanser 1 x Per Day/30 Days Discharge Instructions: Cleanse the wound with wound cleanser prior to applying a clean dressing using gauze sponges, not tissue or cotton balls. Prim Dressing: Hydrofera Blue Ready Transfer Foam, 2.5x2.5 (in/in) 1 x Per Day/30 Days ary Discharge Instructions: Apply directly to wound bed as directed Prim Dressing: Santyl Ointment 1 x Per Day/30 Days ary Discharge Instructions: Apply nickel thick amount to wound bed as instructed Secured With: Conforming Stretch Gauze Bandage, Sterile 2x75 (in/in) 1 x Per Day/30 Days Discharge Instructions: Secure with stretch gauze as directed. Secured With: 18M Medipore Scientist, research (life sciences) Surgical T 2x10 (in/yd) 1 x Per Day/30 Days ape Discharge Instructions: Secure with tape as directed. Wound #4 - Foot Wound Laterality: Left, Medial Cleanser: Soap and Water 1 x Per Day/30 Days Discharge Instructions: May shower and wash wound with dial antibacterial soap and water prior to dressing change. Cleanser:  Wound Cleanser 1 x Per Day/30 Days Discharge Instructions: Cleanse the wound with wound cleanser prior to applying a clean dressing using gauze sponges, not tissue or cotton balls. Prim Dressing: Hydrofera Blue Ready Transfer Foam, 2.5x2.5 (in/in) 1 x Per Day/30 Days ary Discharge Instructions: Apply directly to wound bed as directed Prim Dressing: Santyl Ointment 1 x Per Day/30 Days ary Discharge Instructions: Apply nickel thick amount to wound bed as instructed Secondary Dressing: Bordered Gauze, 2x2 in 1 x Per Day/30 Days Discharge Instructions: Apply over primary dressing as directed. Wound #5 - T Great oe Wound Laterality: Left Cleanser: Soap and Water 1 x Per Day/30 Days Discharge Instructions: May shower and wash wound with dial antibacterial soap and water prior to dressing change. Cleanser: Wound Cleanser 1 x Per Day/30 Days Discharge Instructions: Cleanse the wound with wound cleanser prior to applying a clean dressing using gauze sponges, not tissue or cotton balls. Prim Dressing: Hydrofera Blue Ready Transfer Foam, 2.5x2.5 (in/in) 1 x Per Day/30 Days ary Discharge Instructions: Apply directly to wound bed as directed Prim Dressing: Santyl Ointment 1 x Per Day/30 Days ary Discharge Instructions: Apply nickel thick  amount to wound bed as instructed Secured With: Conforming Stretch Gauze Bandage, Sterile 2x75 (in/in) 1 x Per Day/30 Days Discharge Instructions: Secure with stretch gauze as directed. Secured With: 41M Medipore Scientist, research (life sciences) Surgical T 2x10 (in/yd) 1 x Per Day/30 Days ape Discharge Instructions: Secure with tape as directed. Consults Vascular - Evaluate and Treat related to history of PAD. - (ICD10 L97.518 - Non-pressure chronic ulcer of other part of right foot with other specified severity) Patient Medications llergies: NSAIDS (Non-Steroidal Anti-Inflammatory Drug), Biaxin, Bactrim, lisinopril, Sulfa (Sulfonamide Antibiotics) A Notifications Medication Indication  Start End 12/10/2022 doxycycline hyclate DOSE 1 - oral 100 mg tablet - 1 tablet oral twice a day x 7 days ITALIA, BROWNBACK ([id number]) [id number] Page 6 of 12 Electronic Signature(s) Signed: 12/10/2022 6:34:43 PM By: Karie Schwalbe RN Signed: 12/11/2022 11:26:15 AM By: Geralyn Corwin DO Previous Signature: 12/10/2022 12:56:08 PM Version By: Geralyn Corwin DO Previous Signature: 12/10/2022 10:28:55 AM Version By: Geralyn Corwin DO Entered By: Karie Schwalbe on 12/10/2022 18:22:26 Prescription 12/10/2022 -------------------------------------------------------------------------------- Dianah Field. Geralyn Corwin DO Patient Name: Provider: 12/24/1933 [id number] Date of Birth: NPI#: [id number] Sex: DEA #: [phone] Phone #: License #: UPN: Patient Address: [address] Eligha Bridegroom [facility] [place] [address] , [address] [place], [place] [address] [phone] Allergies NSAIDS (Non-Steroidal Anti-Inflammatory Drug); Biaxin; Bactrim; lisinopril; Sulfa (Sulfonamide Antibiotics) Provider's Orders Vascular - ICD10: L97.518 - Evaluate and Treat related to history of PAD. Hand Signature: Date(s): Electronic Signature(s) Signed: 12/10/2022 6:34:43 PM By: Karie Schwalbe RN Signed: 12/11/2022 11:26:15 AM By: Geralyn Corwin DO Entered By: Karie Schwalbe on 12/10/2022 18:22:26 -------------------------------------------------------------------------------- Problem List Details Patient Name: Date of Service: [name] Spruce M. 12/10/2022 8:00 A M Medical Record Number: [id number] Patient Account Number: [id number] Date of Birth/Sex: Treating RN: 07-May-1934 (89 y.o. F) Primary Care Provider: Hillard Danker Other Clinician: Referring Provider: Treating Provider/Extender: Kizzie Furnish Weeks in Treatment: 0 Active Problems ICD-10 Encounter Code Description Active Date  MDM Diagnosis L97.518 Non-pressure chronic ulcer of other part of right foot with other specified 12/10/2022 No Yes severity L97.528 Non-pressure chronic ulcer of other part of left foot with other specified 12/10/2022 No Yes LAIBA, GOLZ ([id number]) [id number] Page 7 of 12 severity E11.621 Type 2 diabetes mellitus with foot ulcer 12/10/2022 No Yes F03.90 Unspecified dementia, unspecified severity, without behavioral disturbance, 12/10/2022 No Yes psychotic disturbance, mood disturbance, and anxiety I73.9 Peripheral vascular disease, unspecified 12/10/2022 No Yes Inactive Problems Resolved Problems Electronic Signature(s) Signed: 12/10/2022 12:56:08 PM By: Geralyn Corwin DO Entered By: Geralyn Corwin on 12/10/2022 10:15:05 -------------------------------------------------------------------------------- Progress Note Details Patient Name: Date of Service: [name] RRIS, Deven M. 12/10/2022 8:00 A M Medical Record Number: [id number] Patient Account Number: [id number] Date of Birth/Sex: Treating RN: 01/20/1934 (89 y.o. F) Primary Care Provider: Hillard Danker Other Clinician: Referring Provider: Treating Provider/Extender: Kizzie Furnish Weeks in Treatment: 0 Subjective Chief Complaint Information obtained from Patient 12/10/2022; small open wounds to the feet bilaterally History of Present Illness (HPI) 12/10/2022 Ms. Mixtli Towles is an 89 year old female with a past medical history of PAD, dementia, uncontrolled type 2 diabetes on insulin that presents the clinic for a 1 year history of wounds to her feet bilaterally. Daughter is present and helps provide the history. How the wounds have started is unclear. She has been using surgical shoes to offload the wounds. She has been using Santyl to the wound beds. She has slight erythema that daughter  states is new around the periwound on the right and left great toe. She has a history of PAD and is  followed with vein and vascular for this last seeing them on 10/2019. She had no wounds at that time but ABIs showed moderate right infrapopliteal artery occlusive disease and moderate multisegmental left lower extremity arterial occlusive disease. Patient has chronic pain to the wound sites. Patient History Information obtained from Caregiver, Chart. Allergies NSAIDS (Non-Steroidal Anti-Inflammatory Drug), Biaxin (Reaction: hives), Bactrim (Reaction: hives, rash), lisinopril (Reaction: cough), Sulfa (Sulfonamide Antibiotics) (Reaction: hives, rash) Family History Cancer - Siblings, Diabetes - Mother,Siblings, Hypertension - Mother, Lung Disease - Father. Social History Never smoker, Marital Status - Widowed, Alcohol Use - Never, Drug Use - No History, Caffeine Use - Rarely - coffee. Medical History Cardiovascular Patient has history of Hypertension, Hypotension - orthostatic, Peripheral Arterial Disease, Peripheral Venous Disease Gastrointestinal Patient has history of Colitis Endocrine Patient has history of Type II Diabetes KIRAN, LUPOLI ([id number]) [id number] Page 8 of 12 Neurologic Patient has history of Dementia, Seizure Disorder Patient is treated with Insulin. Blood sugar is tested. Blood sugar results noted at the following times: Breakfast - 200. Hospitalization/Surgery History - loop recorder insertion 2021. - 2018 laparotomy. - abdominal hysterectomy. - knee arthroscopy. - vocal cord polyps. Medical A Surgical History Notes nd Ear/Nose/Mouth/Throat dysphagia Cardiovascular hyperlipidemia, Gastrointestinal GERD viral gastroenteritis small bower obstruction 2018 Endocrine hypothyroidism, Genitourinary CKD II urinary retention Integumentary (Skin) eczema Musculoskeletal degenerative joint disease osteoporosis Neurologic delirium encephalopathy Review of Systems (ROS) Ear/Nose/Mouth/Throat Complains or has symptoms of Chronic sinus  problems or rhinitis - allergic rhinitis. Endocrine Complains or has symptoms of Heat/cold intolerance - cold. Objective Constitutional respirations regular, non-labored and within target range for patient.. Vitals Time Taken: 8:15 AM, Height: 66 in, Weight: 110 lbs, BMI: 17.8, Temperature: 97.6 F, Pulse: 73 bpm, Respiratory Rate: 16 breaths/min, Blood Pressure: 183/72 mmHg. Cardiovascular 2+ dorsalis pedis/posterior tibialis pulses. Psychiatric pleasant and cooperative. General Notes: Wounds to the distal aspect of the great toes with nonviable surface. T the medial left foot over a bunion there is a small open wound with o fibrinous tissue. Mild erythema to the periwounds. No increased warmth or purulent drainage. Integumentary (Hair, Skin) Wound #3 status is Open. Original cause of wound was Gradually Appeared. The date acquired was: 12/09/2021. The wound is located on the Right,Distal T oe Great. The wound measures 0.8cm length x 1cm width x 0.1cm depth; 0.628cm^2 area and 0.063cm^3 volume. There is Fat Layer (Subcutaneous Tissue) exposed. There is no tunneling or undermining noted. There is a medium amount of serosanguineous drainage noted. The wound margin is fibrotic, thickened scar. There is small (1-33%) pink granulation within the wound bed. There is a large (67-100%) amount of necrotic tissue within the wound bed including Eschar and Adherent Slough. The periwound skin appearance had no abnormalities noted for texture. The periwound skin appearance had no abnormalities noted for moisture. The periwound skin appearance had no abnormalities noted for color. Periwound temperature was noted as No Abnormality. Wound #4 status is Open. Original cause of wound was Gradually Appeared. The date acquired was: 12/09/2021. The wound is located on the Left,Medial Foot. The wound measures 0.4cm length x 0.3cm width x 0.1cm depth; 0.094cm^2 area and 0.009cm^3 volume. There is no tunneling or  undermining noted. There is a medium amount of serosanguineous drainage noted. There is small (1-33%) pink granulation within the wound bed. There is a large (67-100%) amount of necrotic tissue within the wound bed including  Eschar and Adherent Slough. The periwound skin appearance had no abnormalities noted for texture. The periwound skin appearance had no abnormalities noted for moisture. The periwound skin appearance had no abnormalities noted for color. Periwound temperature was noted as No Abnormality. Wound #5 status is Open. Original cause of wound was Gradually Appeared. The date acquired was: 12/09/2021. The wound is located on the Left T Great. The oe wound measures 0.4cm length x 0.4cm width x 0.1cm depth; 0.126cm^2 area and 0.013cm^3 volume. There is Fat Layer (Subcutaneous Tissue) exposed. There is no tunneling or undermining noted. There is a medium amount of serosanguineous drainage noted. There is small (1-33%) pink granulation within the wound bed. There is a large (67-100%) amount of necrotic tissue within the wound bed including Eschar and Adherent Slough. The periwound skin appearance had no abnormalities noted for texture. The periwound skin appearance had no abnormalities noted for moisture. The periwound skin appearance had no abnormalities noted for color. Periwound temperature was noted as No Abnormality. Assessment Active Problems ICD-10 Non-pressure chronic ulcer of other part of right foot with other specified severity Non-pressure chronic ulcer of other part of left foot with other specified severity Type 2 diabetes mellitus with foot ulcer ADABELLE, OWENSBY ([id number]) [id number] Page 9 of 12 Unspecified dementia, unspecified severity, without behavioral disturbance, psychotic disturbance, mood disturbance, and anxiety Peripheral vascular disease, unspecified Patient presents with a 1 year history of nonhealing ulcers to her feet  bilaterally in the setting of diabetes and peripheral arterial disease. These are fairly small areas and I recommended continuing Santyl and adding Hydrofera Blue. Continue aggressive offloading and she has surgical shoes for this. I recommended she follow-up with vein and vascular and we will send a referral. She did have erythema to the periwound's and I recommended a week of doxycycline. She has been soaking her feet and Epson bath salts and I recommended stopping this. Follow-up in 1 week. Procedures Wound #3 Pre-procedure diagnosis of Wound #3 is a Diabetic Wound/Ulcer of the Lower Extremity located on the Right,Distal T Great .Severity of Tissue Pre oe Debridement is: Fat layer exposed. There was a Chemical/Enzymatic/Mechanical debridement performed by Geralyn Corwin, DO. With the following instrument(s): No instrument after achieving pain control using Lidocaine 5% topical ointment. Agent used was [employer]. A time out was conducted at 09:35, prior to the start of the procedure. There was no bleeding. The procedure was tolerated well with a pain level of 0 throughout and a pain level of 0 following the procedure. Post Debridement Measurements: 0.8cm length x 1cm width x 0.1cm depth; 0.063cm^3 volume. Character of Wound/Ulcer Post Debridement is improved. Severity of Tissue Post Debridement is: Fat layer exposed. Post procedure Diagnosis Wound #3: Same as Pre-Procedure General Notes: Scribed for Dr. Mikey Bussing by Shela Commons.S.. Wound #4 Pre-procedure diagnosis of Wound #4 is a Diabetic Wound/Ulcer of the Lower Extremity located on the Left,Medial Foot .Severity of Tissue Pre Debridement is: Fat layer exposed. There was a Chemical/Enzymatic/Mechanical debridement performed by Geralyn Corwin, DO. With the following instrument(s): No instrument after achieving pain control using Lidocaine 5% topical ointment. Agent used was [employer]. A time out was conducted at 09:35, prior to the start of the procedure.  There was no bleeding. The procedure was tolerated well with a pain level of 0 throughout and a pain level of 0 following the procedure. Post Debridement Measurements: 0.4cm length x 0.3cm width x 0.1cm depth; 0.009cm^3 volume. Character of Wound/Ulcer Post Debridement is improved. Severity of Tissue Post Debridement is: Fat  layer exposed. Post procedure Diagnosis Wound #4: Same as Pre-Procedure General Notes: Scribed for Dr. Mikey Bussing by Shela Commons.S.. Wound #5 Pre-procedure diagnosis of Wound #5 is a Diabetic Wound/Ulcer of the Lower Extremity located on the Left T Great .Severity of Tissue Pre Debridement is: oe Limited to breakdown of skin. There was a Chemical/Enzymatic/Mechanical debridement performed by Geralyn Corwin, DO. With the following instrument(s): No instrument after achieving pain control using Lidocaine 5% topical ointment. Agent used was [employer]. A time out was conducted at 09:35, prior to the start of the procedure. There was no bleeding. The procedure was tolerated well with a pain level of 0 throughout and a pain level of 0 following the procedure. Post Debridement Measurements: 0.4cm length x 0.4cm width x 0.1cm depth; 0.013cm^3 volume. Character of Wound/Ulcer Post Debridement is improved. Severity of Tissue Post Debridement is: Fat layer exposed. Post procedure Diagnosis Wound #5: Same as Pre-Procedure General Notes: Scribed for Dr. Mikey Bussing by J.Scotton. Plan Follow-up Appointments: Return Appointment in 1 week. - +++ Extra Time++++Dr. Mikey Bussing Room 8 Other: - ++ Remember to pick up antibiotics from Pharmacy+++ Anesthetic: (In clinic) Topical Lidocaine 5% applied to wound bed - Used in Clinic Prior to debridement (In clinic) Topical Lidocaine 4% applied to wound bed Bathing/ Shower/ Hygiene: Other Bathing/Shower/Hygiene Orders/Instructions: - Bathe feet with soap and water as tolerated. (please do not soak the feet) Off-Loading: Other: - Float feet with a pillow under the calf  while in bed Home Health: New wound care orders this week; continue Home Health for wound care. May utilize formulary equivalent dressing for wound treatment orders unless otherwise specified. - Please use Santyl on wound bed and then place Hydrafera Blue Ready on Santyl for the 3 wounds. Dressing changes to be completed by Home Health on Monday / Wednesday / Friday except when patient has scheduled visit at [facility]. - Or 3 times a week for Home Health Other Home Health Orders/Instructions: - [facility] Consults ordered were: Vascular - Evaluate and Treat related to history of PAD. The following medication(s) was prescribed: doxycycline hyclate oral 100 mg tablet 1 1 tablet oral twice a day x 7 days starting 12/10/2022 WOUND #3: - T Great Wound Laterality: Right, Distal oe Cleanser: Soap and Water 1 x Per Day/30 Days Discharge Instructions: May shower and wash wound with dial antibacterial soap and water prior to dressing change. Cleanser: Wound Cleanser 1 x Per Day/30 Days Discharge Instructions: Cleanse the wound with wound cleanser prior to applying a clean dressing using gauze sponges, not tissue or cotton balls. Prim Dressing: Hydrofera Blue Ready Transfer Foam, 2.5x2.5 (in/in) 1 x Per Day/30 Days ary Discharge Instructions: Apply directly to wound bed as directed Prim Dressing: Santyl Ointment 1 x Per Day/30 Days ary Discharge Instructions: Apply nickel thick amount to wound bed as instructed Secured With: Conforming Stretch Gauze Bandage, Sterile 2x75 (in/in) 1 x Per Day/30 Days Discharge Instructions: Secure with stretch gauze as directed. Secured With: 43M Medipore Scientist, research (life sciences) Surgical T 2x10 (in/yd) 1 x Per Day/30 Days ape Discharge Instructions: Secure with tape as directed. WOUND #4: - Foot Wound Laterality: Left, Medial Cleanser: Soap and Water 1 x Per Day/30 Days Discharge Instructions: May shower and wash wound with dial antibacterial soap and water prior to dressing  change. Cleanser: Wound Cleanser 1 x Per Day/30 Days Discharge Instructions: Cleanse the wound with wound cleanser prior to applying a clean dressing using gauze sponges, not tissue or cotton balls. LILIANN, DERKS ([id number]) [id number] Page 10 of  12 Prim Dressing: Hydrofera Blue Ready Transfer Foam, 2.5x2.5 (in/in) 1 x Per Day/30 Days ary Discharge Instructions: Apply directly to wound bed as directed Prim Dressing: Santyl Ointment 1 x Per Day/30 Days ary Discharge Instructions: Apply nickel thick amount to wound bed as instructed Secondary Dressing: Bordered Gauze, 2x2 in 1 x Per Day/30 Days Discharge Instructions: Apply over primary dressing as directed. WOUND #5: - T Great Wound Laterality: Left oe Cleanser: Soap and Water 1 x Per Day/30 Days Discharge Instructions: May shower and wash wound with dial antibacterial soap and water prior to dressing change. Cleanser: Wound Cleanser 1 x Per Day/30 Days Discharge Instructions: Cleanse the wound with wound cleanser prior to applying a clean dressing using gauze sponges, not tissue or cotton balls. Prim Dressing: Hydrofera Blue Ready Transfer Foam, 2.5x2.5 (in/in) 1 x Per Day/30 Days ary Discharge Instructions: Apply directly to wound bed as directed Prim Dressing: Santyl Ointment 1 x Per Day/30 Days ary Discharge Instructions: Apply nickel thick amount to wound bed as instructed Secured With: Conforming Stretch Gauze Bandage, Sterile 2x75 (in/in) 1 x Per Day/30 Days Discharge Instructions: Secure with stretch gauze as directed. Secured With: 71M Medipore Scientist, research (life sciences) Surgical T 2x10 (in/yd) 1 x Per Day/30 Days ape Discharge Instructions: Secure with tape as directed. 1. Santyl and Hydrofera Blue 2. Referral to vein and vascular 3. Follow-up in 1 week 4. Doxycycline Electronic Signature(s) Signed: 01/18/2023 1:56:41 PM By: Pearletha Alfred Signed: 01/18/2023 2:57:08 PM By: Geralyn Corwin DO Previous Signature:  12/17/2022 9:31:56 AM Version By: Shawn Stall RN, BSN Previous Signature: 12/17/2022 11:25:45 AM Version By: Geralyn Corwin DO Previous Signature: 12/10/2022 12:56:08 PM Version By: Geralyn Corwin DO Entered By: Pearletha Alfred on 01/18/2023 13:56:41 -------------------------------------------------------------------------------- HxROS Details Patient Name: Date of Service: [name] RRIS, [name] M. 12/10/2022 8:00 A M Medical Record Number: [id number] Patient Account Number: [id number] Date of Birth/Sex: Treating RN: 1934-04-26 (89 y.o. Orville Govern Primary Care Provider: Hillard Danker Other Clinician: Referring Provider: Treating Provider/Extender: Kizzie Furnish Weeks in Treatment: 0 Information Obtained From Caregiver Chart Ear/Nose/Mouth/Throat Complaints and Symptoms: Positive for: Chronic sinus problems or rhinitis - allergic rhinitis Medical History: Past Medical History Notes: dysphagia Endocrine Complaints and Symptoms: Positive for: Heat/cold intolerance - cold Medical History: Positive for: Type II Diabetes Past Medical History Notes: hypothyroidism, Time with diabetes: 40 years Treated with: Insulin Blood sugar tested every day: Yes Tested : 3 x day Blood sugar testing results: Breakfast: 200 Cardiovascular Medical HistoryEVANNA, [name] [name] ([id number]) [id number] Page 11 of 12 Positive for: Hypertension; Hypotension - orthostatic; Peripheral Arterial Disease; Peripheral Venous Disease Past Medical History Notes: hyperlipidemia, Gastrointestinal Medical History: Positive for: Colitis Past Medical History Notes: GERD viral gastroenteritis small bower obstruction 2018 Genitourinary Medical History: Past Medical History Notes: CKD II urinary retention Integumentary (Skin) Medical History: Past Medical History Notes: eczema Musculoskeletal Medical History: Past Medical History Notes: degenerative joint  disease osteoporosis Neurologic Medical History: Positive for: Dementia; Seizure Disorder Past Medical History Notes: delirium encephalopathy Immunizations Pneumococcal Vaccine: Received Pneumococcal Vaccination: Yes Received Pneumococcal Vaccination On or After 60th Birthday: Yes Implantable Devices Yes Hospitalization / Surgery History Type of Hospitalization/Surgery loop recorder insertion 2021 2018 laparotomy abdominal hysterectomy knee arthroscopy vocal cord polyps Family and Social History Cancer: Yes - Siblings; Diabetes: Yes - Mother,Siblings; Hypertension: Yes - Mother; Lung Disease: Yes - Father; Never smoker; Marital Status - Widowed; Alcohol Use: Never; Drug Use: No History; Caffeine Use: Rarely - coffee; Financial Concerns: No; Food, Clothing or Shelter Needs: No;  Support System Lacking: No; Transportation Concerns: No Electronic Signature(s) Signed: 12/10/2022 12:56:08 PM By: Geralyn Corwin DO Signed: 12/10/2022 5:26:09 PM By: Redmond Pulling RN, BSN Signed: 12/10/2022 6:34:43 PM By: Karie Schwalbe RN Entered By: Karie Schwalbe on 12/10/2022 08:31:01 Myeshia, Hoiland Pincus Sanes ([id number]) [id number] Page 12 of 12 -------------------------------------------------------------------------------- SuperBill Details Patient Name: Date of Service: [name] REIA, CAHAN 12/10/2022 Medical Record Number: [id number] Patient Account Number: [id number] Date of Birth/Sex: Treating RN: Jun 03, 1934 (89 y.o. F) Primary Care Provider: Hillard Danker Other Clinician: Referring Provider: Treating Provider/Extender: Kizzie Furnish Weeks in Treatment: 0 Diagnosis Coding ICD-10 Codes Code Description [phone] Non-pressure chronic ulcer of other part of right foot with other specified severity L97.528 Non-pressure chronic ulcer of other part of left foot with other specified severity E11.621 Type 2 diabetes mellitus with foot ulcer F03.90 Unspecified  dementia, unspecified severity, without behavioral disturbance, psychotic disturbance, mood disturbance, and anxiety I73.9 Peripheral vascular disease, unspecified Facility Procedures : CPT4 Code: 69629528 Description: 99214 - WOUND CARE VISIT-LEV 4 EST PT Modifier: 25 Quantity: 1 : CPT4 Code: 41324401 Description: 02725 - DEBRIDE W/O ANES NON SELECT Modifier: Quantity: 1 Physician Procedures : CPT4 Code Description Modifier [id number] 47425 - WC PHYS LEVEL 4 - NEW PT ICD-10 Diagnosis Description L97.518 Non-pressure chronic ulcer of other part of right foot with other specified severity L97.528 Non-pressure chronic ulcer of other part of left  foot with other specified severity E11.621 Type 2 diabetes mellitus with foot ulcer I73.9 Peripheral vascular disease, unspecified Quantity: 1 Electronic Signature(s) Signed: 12/10/2022 6:34:43 PM By: Karie Schwalbe RN Signed: 12/11/2022 11:26:15 AM By: Geralyn Corwin DO Previous Signature: 12/10/2022 12:56:08 PM Version By: Geralyn Corwin DO Entered By: Karie Schwalbe on 12/10/2022 18:19:07

## 2023-01-19 DIAGNOSIS — L89896 Pressure-induced deep tissue damage of other site: Secondary | ICD-10-CM | POA: Diagnosis not present

## 2023-01-19 DIAGNOSIS — I152 Hypertension secondary to endocrine disorders: Secondary | ICD-10-CM | POA: Diagnosis not present

## 2023-01-19 DIAGNOSIS — E1122 Type 2 diabetes mellitus with diabetic chronic kidney disease: Secondary | ICD-10-CM | POA: Diagnosis not present

## 2023-01-19 DIAGNOSIS — L97528 Non-pressure chronic ulcer of other part of left foot with other specified severity: Secondary | ICD-10-CM | POA: Diagnosis not present

## 2023-01-19 DIAGNOSIS — D631 Anemia in chronic kidney disease: Secondary | ICD-10-CM | POA: Diagnosis not present

## 2023-01-19 DIAGNOSIS — L97518 Non-pressure chronic ulcer of other part of right foot with other specified severity: Secondary | ICD-10-CM | POA: Diagnosis not present

## 2023-01-19 DIAGNOSIS — E11621 Type 2 diabetes mellitus with foot ulcer: Secondary | ICD-10-CM | POA: Diagnosis not present

## 2023-01-19 DIAGNOSIS — L89152 Pressure ulcer of sacral region, stage 2: Secondary | ICD-10-CM | POA: Diagnosis not present

## 2023-01-19 DIAGNOSIS — N182 Chronic kidney disease, stage 2 (mild): Secondary | ICD-10-CM | POA: Diagnosis not present

## 2023-01-19 DIAGNOSIS — E1159 Type 2 diabetes mellitus with other circulatory complications: Secondary | ICD-10-CM | POA: Diagnosis not present

## 2023-01-20 DIAGNOSIS — Z79899 Other long term (current) drug therapy: Secondary | ICD-10-CM | POA: Diagnosis not present

## 2023-01-20 DIAGNOSIS — M1991 Primary osteoarthritis, unspecified site: Secondary | ICD-10-CM | POA: Diagnosis not present

## 2023-01-20 DIAGNOSIS — M6281 Muscle weakness (generalized): Secondary | ICD-10-CM | POA: Diagnosis not present

## 2023-01-20 DIAGNOSIS — M4802 Spinal stenosis, cervical region: Secondary | ICD-10-CM | POA: Diagnosis not present

## 2023-01-20 DIAGNOSIS — M3390 Dermatopolymyositis, unspecified, organ involvement unspecified: Secondary | ICD-10-CM | POA: Diagnosis not present

## 2023-01-22 DIAGNOSIS — L97518 Non-pressure chronic ulcer of other part of right foot with other specified severity: Secondary | ICD-10-CM | POA: Diagnosis not present

## 2023-01-22 DIAGNOSIS — E440 Moderate protein-calorie malnutrition: Secondary | ICD-10-CM | POA: Diagnosis not present

## 2023-01-22 DIAGNOSIS — E11621 Type 2 diabetes mellitus with foot ulcer: Secondary | ICD-10-CM | POA: Diagnosis not present

## 2023-01-22 DIAGNOSIS — D631 Anemia in chronic kidney disease: Secondary | ICD-10-CM | POA: Diagnosis not present

## 2023-01-22 DIAGNOSIS — A419 Sepsis, unspecified organism: Secondary | ICD-10-CM | POA: Diagnosis not present

## 2023-01-22 DIAGNOSIS — G40909 Epilepsy, unspecified, not intractable, without status epilepticus: Secondary | ICD-10-CM | POA: Diagnosis not present

## 2023-01-22 DIAGNOSIS — F0390 Unspecified dementia without behavioral disturbance: Secondary | ICD-10-CM | POA: Diagnosis not present

## 2023-01-22 DIAGNOSIS — E039 Hypothyroidism, unspecified: Secondary | ICD-10-CM | POA: Diagnosis not present

## 2023-01-22 DIAGNOSIS — E1122 Type 2 diabetes mellitus with diabetic chronic kidney disease: Secondary | ICD-10-CM | POA: Diagnosis not present

## 2023-01-22 DIAGNOSIS — E11649 Type 2 diabetes mellitus with hypoglycemia without coma: Secondary | ICD-10-CM | POA: Diagnosis not present

## 2023-01-22 DIAGNOSIS — Z794 Long term (current) use of insulin: Secondary | ICD-10-CM | POA: Diagnosis not present

## 2023-01-22 DIAGNOSIS — N179 Acute kidney failure, unspecified: Secondary | ICD-10-CM | POA: Diagnosis not present

## 2023-01-22 DIAGNOSIS — E1159 Type 2 diabetes mellitus with other circulatory complications: Secondary | ICD-10-CM | POA: Diagnosis not present

## 2023-01-22 DIAGNOSIS — L89896 Pressure-induced deep tissue damage of other site: Secondary | ICD-10-CM | POA: Diagnosis not present

## 2023-01-22 DIAGNOSIS — Z7982 Long term (current) use of aspirin: Secondary | ICD-10-CM | POA: Diagnosis not present

## 2023-01-22 DIAGNOSIS — N390 Urinary tract infection, site not specified: Secondary | ICD-10-CM | POA: Diagnosis not present

## 2023-01-22 DIAGNOSIS — N182 Chronic kidney disease, stage 2 (mild): Secondary | ICD-10-CM | POA: Diagnosis not present

## 2023-01-22 DIAGNOSIS — G9341 Metabolic encephalopathy: Secondary | ICD-10-CM | POA: Diagnosis not present

## 2023-01-22 DIAGNOSIS — L89152 Pressure ulcer of sacral region, stage 2: Secondary | ICD-10-CM | POA: Diagnosis not present

## 2023-01-22 DIAGNOSIS — I152 Hypertension secondary to endocrine disorders: Secondary | ICD-10-CM | POA: Diagnosis not present

## 2023-01-22 DIAGNOSIS — L97528 Non-pressure chronic ulcer of other part of left foot with other specified severity: Secondary | ICD-10-CM | POA: Diagnosis not present

## 2023-01-22 DIAGNOSIS — E1151 Type 2 diabetes mellitus with diabetic peripheral angiopathy without gangrene: Secondary | ICD-10-CM | POA: Diagnosis not present

## 2023-01-25 DIAGNOSIS — L89152 Pressure ulcer of sacral region, stage 2: Secondary | ICD-10-CM | POA: Diagnosis not present

## 2023-01-25 DIAGNOSIS — L97518 Non-pressure chronic ulcer of other part of right foot with other specified severity: Secondary | ICD-10-CM | POA: Diagnosis not present

## 2023-01-25 DIAGNOSIS — I152 Hypertension secondary to endocrine disorders: Secondary | ICD-10-CM | POA: Diagnosis not present

## 2023-01-25 DIAGNOSIS — G40909 Epilepsy, unspecified, not intractable, without status epilepticus: Secondary | ICD-10-CM | POA: Diagnosis not present

## 2023-01-25 DIAGNOSIS — Z794 Long term (current) use of insulin: Secondary | ICD-10-CM | POA: Diagnosis not present

## 2023-01-25 DIAGNOSIS — L89896 Pressure-induced deep tissue damage of other site: Secondary | ICD-10-CM | POA: Diagnosis not present

## 2023-01-25 DIAGNOSIS — E11649 Type 2 diabetes mellitus with hypoglycemia without coma: Secondary | ICD-10-CM | POA: Diagnosis not present

## 2023-01-25 DIAGNOSIS — E440 Moderate protein-calorie malnutrition: Secondary | ICD-10-CM | POA: Diagnosis not present

## 2023-01-25 DIAGNOSIS — E1151 Type 2 diabetes mellitus with diabetic peripheral angiopathy without gangrene: Secondary | ICD-10-CM | POA: Diagnosis not present

## 2023-01-25 DIAGNOSIS — E1122 Type 2 diabetes mellitus with diabetic chronic kidney disease: Secondary | ICD-10-CM | POA: Diagnosis not present

## 2023-01-25 DIAGNOSIS — D631 Anemia in chronic kidney disease: Secondary | ICD-10-CM | POA: Diagnosis not present

## 2023-01-25 DIAGNOSIS — E1159 Type 2 diabetes mellitus with other circulatory complications: Secondary | ICD-10-CM | POA: Diagnosis not present

## 2023-01-25 DIAGNOSIS — N390 Urinary tract infection, site not specified: Secondary | ICD-10-CM | POA: Diagnosis not present

## 2023-01-25 DIAGNOSIS — A419 Sepsis, unspecified organism: Secondary | ICD-10-CM | POA: Diagnosis not present

## 2023-01-25 DIAGNOSIS — G9341 Metabolic encephalopathy: Secondary | ICD-10-CM | POA: Diagnosis not present

## 2023-01-25 DIAGNOSIS — E11621 Type 2 diabetes mellitus with foot ulcer: Secondary | ICD-10-CM | POA: Diagnosis not present

## 2023-01-25 DIAGNOSIS — Z7982 Long term (current) use of aspirin: Secondary | ICD-10-CM | POA: Diagnosis not present

## 2023-01-25 DIAGNOSIS — L97528 Non-pressure chronic ulcer of other part of left foot with other specified severity: Secondary | ICD-10-CM | POA: Diagnosis not present

## 2023-01-25 DIAGNOSIS — E039 Hypothyroidism, unspecified: Secondary | ICD-10-CM | POA: Diagnosis not present

## 2023-01-25 DIAGNOSIS — N182 Chronic kidney disease, stage 2 (mild): Secondary | ICD-10-CM | POA: Diagnosis not present

## 2023-01-25 DIAGNOSIS — N179 Acute kidney failure, unspecified: Secondary | ICD-10-CM | POA: Diagnosis not present

## 2023-01-25 DIAGNOSIS — F0390 Unspecified dementia without behavioral disturbance: Secondary | ICD-10-CM | POA: Diagnosis not present

## 2023-01-27 ENCOUNTER — Encounter: Payer: Self-pay | Admitting: Vascular Surgery

## 2023-01-27 ENCOUNTER — Ambulatory Visit: Payer: Medicare HMO | Admitting: Vascular Surgery

## 2023-01-27 ENCOUNTER — Ambulatory Visit (HOSPITAL_COMMUNITY)
Admission: RE | Admit: 2023-01-27 | Discharge: 2023-01-27 | Disposition: A | Discharge status: Home / Self Care | Payer: Medicare HMO | Source: Ambulatory Visit | Attending: Vascular Surgery | Admitting: Vascular Surgery

## 2023-01-27 VITALS — BP 171/79 | HR 75 | Temp 98.2°F | Resp 20 | Ht 66.0 in | Wt 100.0 lb

## 2023-01-27 DIAGNOSIS — I7025 Atherosclerosis of native arteries of other extremities with ulceration: Secondary | ICD-10-CM

## 2023-01-27 DIAGNOSIS — I739 Peripheral vascular disease, unspecified: Secondary | ICD-10-CM | POA: Insufficient documentation

## 2023-01-27 LAB — VAS US ABI WITH/WO TBI

## 2023-01-27 NOTE — H&P (View-Only) (Signed)
Patient ID: [name] [name], female   DOB: 09-16-1933, 89 y.o.   MRN: [id number]  Reason for Consult: New Patient (Initial Visit)   Referred by [name], MD  Subjective:     HPI:  [name] [name] is a 89 y.o. female with known history of peripheral arterial disease provide here for decreased left lower extremity ABI over 3 years ago.  More recently she developed a left first hallux wound and now bilateral great toe ulceration.  She has risk factors of hypertension hyperlipidemia and diabetes.  She is on methotrexate and steroids for skin disease.  She is now here for evaluation of possible arterial insufficiency leading to lower extremity wounds.  She is followed at the wound care center.  Patient is able to transfer she does not walk much and is in a wheelchair today.  Past Medical History:  Diagnosis Date   Arthritis    "knees, legs" (06/10/2016)   Ascites    Chronic kidney disease    "related to my diabetes"   Chronic lower back pain    Diabetes mellitus without complication (HCC)    Eczema    GERD (gastroesophageal reflux disease)    High cholesterol    Hyperlipidemia    Hypertension    Hypothyroidism    OAB (overactive bladder)    Osteoporosis    Thyroid disease    Type II diabetes mellitus (HCC)    Urticaria    Family History  Problem Relation Age of Onset   Diabetes Mother    Hypertension Mother    Asthma Father    Diabetes Sister    Stomach cancer Sister    Diabetes Brother    Allergic rhinitis Neg Hx    Eczema Neg Hx    Urticaria Neg Hx    Past Surgical History:  Procedure Laterality Date   ABDOMINAL HYSTERECTOMY     KNEE ARTHROSCOPY     LAPAROTOMY N/A 12/03/2016   Procedure: EXPLORATORY LAPAROTOMY, LYSIS OF ADHESIONS;  Surgeon: Darnell Level, MD;  Location: [facility];  Service: General;  Laterality: N/A;   LOOP RECORDER INSERTION N/A 08/23/2019   Procedure: LOOP RECORDER INSERTION;  Surgeon: Marinus Maw, MD;  Location: [facility];  Service:  Cardiovascular;  Laterality: N/A;   vocal cord polyps      Short Social History:  Social History   Tobacco Use   Smoking status: Never   Smokeless tobacco: Former    Types: Chew    Quit date: 1985  Substance Use Topics   Alcohol use: No    Allergies  Allergen Reactions   Nsaids Other (See Comments)    No NSAIDS due to kidney function- ESPECIALLY ibuprofen or Aleve   Biaxin [Clarithromycin] Hives   Bactrim [Sulfamethoxazole-Trimethoprim] Hives and Rash   Lisinopril Cough   Sulfa Antibiotics Hives, Rash and Other (See Comments)    NO sulfa-based meds!!     Current Outpatient Medications  Medication Sig Dispense Refill   acetaminophen (TYLENOL) 650 MG CR tablet Take 650 mg by mouth every 8 (eight) hours as needed for pain.     BAYER LOW DOSE 81 MG EC tablet Take 81 mg by mouth daily. Swallow whole.     cetirizine (ZYRTEC) 10 MG tablet Take 10 mg by mouth in the morning, at noon, and at bedtime.     Cholecalciferol (VITAMIN D-3 PO) Take 1,000 Units by mouth daily.     doxycycline (VIBRA-TABS) 100 MG tablet Take 100 mg by mouth 2 (  two) times daily.     Famotidine (PEPCID AC PO) Take 20 mg by mouth at bedtime.     folic acid (FOLVITE) 1 MG tablet Take 1 mg by mouth at bedtime.     insulin aspart (NOVOLOG FLEXPEN) 100 UNIT/ML FlexPen Inject 2-10 Units into the skin See admin instructions. Inject 2-10 units into the skin three times a day with meals, per sliding scale:  Breakfast: BGL 80-199 = 8 units; 200-299 = 9 units; 300 or greater = 10 units Lunch: BGL 80-199 = 5 units; 200-299 = 6 units; 300 or greater = 7 units Supper/evening meal: BGL 80-199 = 2 units; 200-299 = 3 units; 300 or greater = 4 units     insulin detemir (LEVEMIR FLEXTOUCH) 100 UNIT/ML FlexPen Inject 3-7 Units into the skin See admin instructions. Inject 7 units into the skin in the morning and 3 units at bedtime if BGL is 200 or below and 4 units if BGL is greater than 200     levETIRAcetam (KEPPRA) 100 MG/ML  solution Take 5 mLs (500 mg total) by mouth 2 (two) times daily.     liver oil-zinc oxide (DESITIN) 40 % ointment Apply topically 3 (three) times daily. (Patient taking differently: Apply 1 Application topically as needed for irritation.) 56.7 g 0   loperamide (IMODIUM A-D) 2 MG tablet Take 2 mg by mouth 4 (four) times daily as needed for diarrhea or loose stools.     methotrexate (RHEUMATREX) 2.5 MG tablet Take 1 tablet (2.5 mg total) by mouth once a week. Caution:Chemotherapy. Protect from light. (Patient taking differently: Take 15 mg by mouth every Saturday. Caution:Chemotherapy. Protect from light.) 4 tablet 0   metoprolol tartrate (LOPRESSOR) 25 MG tablet Take 1 tablet (25 mg total) by mouth 2 (two) times daily. (Patient taking differently: Take 12.5 mg by mouth as needed (high blood pressure).) 60 tablet 3   midodrine (PROAMATINE) 5 MG tablet Take 0.5 tablets (2.5 mg total) by mouth 3 (three) times daily with meals. 90 tablet 2   nystatin (MYCOSTATIN) 100000 UNIT/ML suspension Take 5 mLs (500,000 Units total) by mouth 4 (four) times daily. 60 mL 0   polyethylene glycol (MIRALAX / GLYCOLAX) 17 g packet Take 17 g by mouth daily. (Patient taking differently: Take 17 g by mouth daily as needed for mild constipation.) 14 each 0   predniSONE (DELTASONE) 5 MG tablet Take 5 mg by mouth daily with breakfast.     triamcinolone ointment (KENALOG) 0.1 % Apply 1 Application topically 2 (two) times daily as needed (itching, rash).     levothyroxine (SYNTHROID) 100 MCG tablet Take 1 tablet (100 mcg total) by mouth daily before breakfast. 30 tablet 0   No current facility-administered medications for this visit.    Review of Systems  Constitutional:  Constitutional negative. HENT: HENT negative.  Eyes: Eyes negative.  Respiratory: Respiratory negative.  GI: Gastrointestinal negative.  Skin: Positive for wound.  Neurological: Neurological negative. Hematologic: Hematologic/lymphatic negative.   Psychiatric: Psychiatric negative.        Objective:  Objective   Vitals:   01/27/23 1025  BP: (!) 171/79  Pulse: 75  Resp: 20  Temp: 98.2 F (36.8 C)  SpO2: 96%  Weight: 100 lb (45.4 kg)  Height: 5\' 6"  (1.676 m)   Body mass index is 16.14 kg/m.  Physical Exam HENT:     Head: Normocephalic.     Nose: Nose normal.  Eyes:     Pupils: Pupils are equal, round, and reactive to  light.  Cardiovascular:     Pulses:          Femoral pulses are 2+ on the right side and 2+ on the left side.      Popliteal pulses are 2+ on the right side and 0 on the left side.       Dorsalis pedis pulses are 0 on the right side and 0 on the left side.       Posterior tibial pulses are 0 on the right side and 0 on the left side.  Abdominal:     General: Abdomen is flat.     Palpations: Abdomen is soft.  Musculoskeletal:     Right lower leg: No edema.     Left lower leg: No edema.  Skin:    Comments: Tight Wounds of right great toe and left great toe and left first hallux with dressings in place  Neurological:     General: No focal deficit present.     Mental Status: She is alert.  Psychiatric:        Mood and Affect: Mood normal.     Data: ABI Findings:  +-----+------------------+-----+----------+----------------+  RightRt Pressure (mmHg)IndexWaveform  Comment           +-----+------------------+-----+----------+----------------+  PTA                        monophasicnon-compressible  +-----+------------------+-----+----------+----------------+  DP                         monophasicnon-compressible  +-----+------------------+-----+----------+----------------+   +--------+------------------+-----+----------+----------------+  Left   Lt Pressure (mmHg)IndexWaveform  Comment           +--------+------------------+-----+----------+----------------+  Brachial160                                                +--------+------------------+-----+----------+----------------+  PTA                           monophasicnon-compressible  +--------+------------------+-----+----------+----------------+  DP                            monophasicnon-compressible  +--------+------------------+-----+----------+----------------+   +-------+-----------+-----------+------------+------------+  ABI/TBIToday's ABIToday's TBIPrevious ABIPrevious TBI  +-------+-----------+-----------+------------+------------+  Right [name] [name]         bandages                             +-------+-----------+-----------+------------+------------+  Left  [name] [name]         bandages                             +-------+-----------+-----------+------------+------------+     TOES Findings:  +----------+---------------+--------+--------+  Right ToesPressure (mmHg)WaveformComment   +----------+---------------+--------+--------+  2nd Digit                        dampened  +----------+---------------+--------+--------+      +---------+---------------+--------+--------+  Left ToesPressure (mmHg)WaveformComment   +---------+---------------+--------+--------+  2nd Digit                       dampened  +---------+---------------+--------+--------+           Summary:  Right: Resting  right ankle-brachial index indicates noncompressible right  lower extremity arteries.  Waveforms are monophasic.   Left: Resting left ankle-brachial index indicates noncompressible left  lower extremity arteries.  Waveforms are monophasic.      Assessment/Plan:    89 year old female with chronic bilateral lower extremity limb threatening ischemia with left side worse than right without palpable popliteal pulse on the left or any pulses distal and dampened toe pressures with previous ABI 3 years ago 0.66.  On the right side she does have a palpable popliteal pulse likely has tibial disease.  Possible concomitant  tibial disease on the left.  I discussed proceeding with angiography from right common femoral approach to evaluate both lower extremities and possibly intervene on the left.  I discussed the risk benefits alternatives with the patient and her daughter and they demonstrate good understanding.  Hopefully will be able to avoid any need for surgical intervention.  Jalayiah ARLOA PRAK has atherosclerosis of the native arteries of the Bilateral lower extremities causing ulceration. The patient is on best medical therapy for peripheral arterial disease. The patient has been counseled about the risks of tobacco use in atherosclerotic disease. The patient has been counseled to abstain from any tobacco use. An aortogram with bilateral lower extremity runoff angiography and Bilateral lower extremity intervention and is indicated to better evaluate the patient's lower extremity circulation because of the  limb threatening nature of the patient's diagnosis. Based on the patient's clinical exam and non-invasive data, we anticipate an endovascular intervention in the femoropopliteal and tibial vessels. Stenting and/or athrectomy would be favored because of the improved primary patency of these interventions as compared to plain balloon angioplasty.        Maeola Harman MD [facility]

## 2023-01-27 NOTE — Progress Notes (Signed)
Patient ID: [name] [name], female   DOB: 09-16-1933, 89 y.o.   MRN: [id number]  Reason for Consult: New Patient (Initial Visit)   Referred by [name], MD  Subjective:     HPI:  [name] [name] is a 89 y.o. female with known history of peripheral arterial disease provide here for decreased left lower extremity ABI over 3 years ago.  More recently she developed a left first hallux wound and now bilateral great toe ulceration.  She has risk factors of hypertension hyperlipidemia and diabetes.  She is on methotrexate and steroids for skin disease.  She is now here for evaluation of possible arterial insufficiency leading to lower extremity wounds.  She is followed at the wound care center.  Patient is able to transfer she does not walk much and is in a wheelchair today.  Past Medical History:  Diagnosis Date   Arthritis    "knees, legs" (06/10/2016)   Ascites    Chronic kidney disease    "related to my diabetes"   Chronic lower back pain    Diabetes mellitus without complication (HCC)    Eczema    GERD (gastroesophageal reflux disease)    High cholesterol    Hyperlipidemia    Hypertension    Hypothyroidism    OAB (overactive bladder)    Osteoporosis    Thyroid disease    Type II diabetes mellitus (HCC)    Urticaria    Family History  Problem Relation Age of Onset   Diabetes Mother    Hypertension Mother    Asthma Father    Diabetes Sister    Stomach cancer Sister    Diabetes Brother    Allergic rhinitis Neg Hx    Eczema Neg Hx    Urticaria Neg Hx    Past Surgical History:  Procedure Laterality Date   ABDOMINAL HYSTERECTOMY     KNEE ARTHROSCOPY     LAPAROTOMY N/A 12/03/2016   Procedure: EXPLORATORY LAPAROTOMY, LYSIS OF ADHESIONS;  Surgeon: Darnell Level, MD;  Location: [facility];  Service: General;  Laterality: N/A;   LOOP RECORDER INSERTION N/A 08/23/2019   Procedure: LOOP RECORDER INSERTION;  Surgeon: Marinus Maw, MD;  Location: [facility];  Service:  Cardiovascular;  Laterality: N/A;   vocal cord polyps      Short Social History:  Social History   Tobacco Use   Smoking status: Never   Smokeless tobacco: Former    Types: Chew    Quit date: 1985  Substance Use Topics   Alcohol use: No    Allergies  Allergen Reactions   Nsaids Other (See Comments)    No NSAIDS due to kidney function- ESPECIALLY ibuprofen or Aleve   Biaxin [Clarithromycin] Hives   Bactrim [Sulfamethoxazole-Trimethoprim] Hives and Rash   Lisinopril Cough   Sulfa Antibiotics Hives, Rash and Other (See Comments)    NO sulfa-based meds!!     Current Outpatient Medications  Medication Sig Dispense Refill   acetaminophen (TYLENOL) 650 MG CR tablet Take 650 mg by mouth every 8 (eight) hours as needed for pain.     BAYER LOW DOSE 81 MG EC tablet Take 81 mg by mouth daily. Swallow whole.     cetirizine (ZYRTEC) 10 MG tablet Take 10 mg by mouth in the morning, at noon, and at bedtime.     Cholecalciferol (VITAMIN D-3 PO) Take 1,000 Units by mouth daily.     doxycycline (VIBRA-TABS) 100 MG tablet Take 100 mg by mouth 2 (  two) times daily.     Famotidine (PEPCID AC PO) Take 20 mg by mouth at bedtime.     folic acid (FOLVITE) 1 MG tablet Take 1 mg by mouth at bedtime.     insulin aspart (NOVOLOG FLEXPEN) 100 UNIT/ML FlexPen Inject 2-10 Units into the skin See admin instructions. Inject 2-10 units into the skin three times a day with meals, per sliding scale:  Breakfast: BGL 80-199 = 8 units; 200-299 = 9 units; 300 or greater = 10 units Lunch: BGL 80-199 = 5 units; 200-299 = 6 units; 300 or greater = 7 units Supper/evening meal: BGL 80-199 = 2 units; 200-299 = 3 units; 300 or greater = 4 units     insulin detemir (LEVEMIR FLEXTOUCH) 100 UNIT/ML FlexPen Inject 3-7 Units into the skin See admin instructions. Inject 7 units into the skin in the morning and 3 units at bedtime if BGL is 200 or below and 4 units if BGL is greater than 200     levETIRAcetam (KEPPRA) 100 MG/ML  solution Take 5 mLs (500 mg total) by mouth 2 (two) times daily.     liver oil-zinc oxide (DESITIN) 40 % ointment Apply topically 3 (three) times daily. (Patient taking differently: Apply 1 Application topically as needed for irritation.) 56.7 g 0   loperamide (IMODIUM A-D) 2 MG tablet Take 2 mg by mouth 4 (four) times daily as needed for diarrhea or loose stools.     methotrexate (RHEUMATREX) 2.5 MG tablet Take 1 tablet (2.5 mg total) by mouth once a week. Caution:Chemotherapy. Protect from light. (Patient taking differently: Take 15 mg by mouth every Saturday. Caution:Chemotherapy. Protect from light.) 4 tablet 0   metoprolol tartrate (LOPRESSOR) 25 MG tablet Take 1 tablet (25 mg total) by mouth 2 (two) times daily. (Patient taking differently: Take 12.5 mg by mouth as needed (high blood pressure).) 60 tablet 3   midodrine (PROAMATINE) 5 MG tablet Take 0.5 tablets (2.5 mg total) by mouth 3 (three) times daily with meals. 90 tablet 2   nystatin (MYCOSTATIN) 100000 UNIT/ML suspension Take 5 mLs (500,000 Units total) by mouth 4 (four) times daily. 60 mL 0   polyethylene glycol (MIRALAX / GLYCOLAX) 17 g packet Take 17 g by mouth daily. (Patient taking differently: Take 17 g by mouth daily as needed for mild constipation.) 14 each 0   predniSONE (DELTASONE) 5 MG tablet Take 5 mg by mouth daily with breakfast.     triamcinolone ointment (KENALOG) 0.1 % Apply 1 Application topically 2 (two) times daily as needed (itching, rash).     levothyroxine (SYNTHROID) 100 MCG tablet Take 1 tablet (100 mcg total) by mouth daily before breakfast. 30 tablet 0   No current facility-administered medications for this visit.    Review of Systems  Constitutional:  Constitutional negative. HENT: HENT negative.  Eyes: Eyes negative.  Respiratory: Respiratory negative.  GI: Gastrointestinal negative.  Skin: Positive for wound.  Neurological: Neurological negative. Hematologic: Hematologic/lymphatic negative.   Psychiatric: Psychiatric negative.        Objective:  Objective   Vitals:   01/27/23 1025  BP: (!) 171/79  Pulse: 75  Resp: 20  Temp: 98.2 F (36.8 C)  SpO2: 96%  Weight: 100 lb (45.4 kg)  Height: 5\' 6"  (1.676 m)   Body mass index is 16.14 kg/m.  Physical Exam HENT:     Head: Normocephalic.     Nose: Nose normal.  Eyes:     Pupils: Pupils are equal, round, and reactive to  light.  Cardiovascular:     Pulses:          Femoral pulses are 2+ on the right side and 2+ on the left side.      Popliteal pulses are 2+ on the right side and 0 on the left side.       Dorsalis pedis pulses are 0 on the right side and 0 on the left side.       Posterior tibial pulses are 0 on the right side and 0 on the left side.  Abdominal:     General: Abdomen is flat.     Palpations: Abdomen is soft.  Musculoskeletal:     Right lower leg: No edema.     Left lower leg: No edema.  Skin:    Comments: Tight Wounds of right great toe and left great toe and left first hallux with dressings in place  Neurological:     General: No focal deficit present.     Mental Status: She is alert.  Psychiatric:        Mood and Affect: Mood normal.     Data: ABI Findings:  +-----+------------------+-----+----------+----------------+  RightRt Pressure (mmHg)IndexWaveform  Comment           +-----+------------------+-----+----------+----------------+  PTA                        monophasicnon-compressible  +-----+------------------+-----+----------+----------------+  DP                         monophasicnon-compressible  +-----+------------------+-----+----------+----------------+   +--------+------------------+-----+----------+----------------+  Left   Lt Pressure (mmHg)IndexWaveform  Comment           +--------+------------------+-----+----------+----------------+  [id number]                                                +--------+------------------+-----+----------+----------------+  PTA                           monophasicnon-compressible  +--------+------------------+-----+----------+----------------+  DP                            monophasicnon-compressible  +--------+------------------+-----+----------+----------------+   +-------+-----------+-----------+------------+------------+  ABI/TBIToday's ABIToday's TBIPrevious ABIPrevious TBI  +-------+-----------+-----------+------------+------------+  Right [name] [name]         bandages                             +-------+-----------+-----------+------------+------------+  Left  [name] [name]         bandages                             +-------+-----------+-----------+------------+------------+     TOES Findings:  +----------+---------------+--------+--------+  Right ToesPressure (mmHg)WaveformComment   +----------+---------------+--------+--------+  2nd Digit                        dampened  +----------+---------------+--------+--------+      +---------+---------------+--------+--------+  Left ToesPressure (mmHg)WaveformComment   +---------+---------------+--------+--------+  2nd Digit                       dampened  +---------+---------------+--------+--------+           Summary:  Right: Resting  right ankle-brachial index indicates noncompressible right  lower extremity arteries.  Waveforms are monophasic.   Left: Resting left ankle-brachial index indicates noncompressible left  lower extremity arteries.  Waveforms are monophasic.      Assessment/Plan:    89 year old female with chronic bilateral lower extremity limb threatening ischemia with left side worse than right without palpable popliteal pulse on the left or any pulses distal and dampened toe pressures with previous ABI 3 years ago 0.66.  On the right side she does have a palpable popliteal pulse likely has tibial disease.  Possible concomitant  tibial disease on the left.  I discussed proceeding with angiography from right common femoral approach to evaluate both lower extremities and possibly intervene on the left.  I discussed the risk benefits alternatives with the patient and her daughter and they demonstrate good understanding.  Hopefully will be able to avoid any need for surgical intervention.  Jalayiah ARLOA PRAK has atherosclerosis of the native arteries of the Bilateral lower extremities causing ulceration. The patient is on best medical therapy for peripheral arterial disease. The patient has been counseled about the risks of tobacco use in atherosclerotic disease. The patient has been counseled to abstain from any tobacco use. An aortogram with bilateral lower extremity runoff angiography and Bilateral lower extremity intervention and is indicated to better evaluate the patient's lower extremity circulation because of the  limb threatening nature of the patient's diagnosis. Based on the patient's clinical exam and non-invasive data, we anticipate an endovascular intervention in the femoropopliteal and tibial vessels. Stenting and/or athrectomy would be favored because of the improved primary patency of these interventions as compared to plain balloon angioplasty.        Maeola Harman MD [facility]

## 2023-01-28 DIAGNOSIS — F0390 Unspecified dementia without behavioral disturbance: Secondary | ICD-10-CM | POA: Diagnosis not present

## 2023-01-28 DIAGNOSIS — L89896 Pressure-induced deep tissue damage of other site: Secondary | ICD-10-CM | POA: Diagnosis not present

## 2023-01-28 DIAGNOSIS — E11621 Type 2 diabetes mellitus with foot ulcer: Secondary | ICD-10-CM | POA: Diagnosis not present

## 2023-01-28 DIAGNOSIS — E11649 Type 2 diabetes mellitus with hypoglycemia without coma: Secondary | ICD-10-CM | POA: Diagnosis not present

## 2023-01-28 DIAGNOSIS — E1151 Type 2 diabetes mellitus with diabetic peripheral angiopathy without gangrene: Secondary | ICD-10-CM | POA: Diagnosis not present

## 2023-01-28 DIAGNOSIS — G9341 Metabolic encephalopathy: Secondary | ICD-10-CM | POA: Diagnosis not present

## 2023-01-28 DIAGNOSIS — L89152 Pressure ulcer of sacral region, stage 2: Secondary | ICD-10-CM | POA: Diagnosis not present

## 2023-01-28 DIAGNOSIS — D631 Anemia in chronic kidney disease: Secondary | ICD-10-CM | POA: Diagnosis not present

## 2023-01-28 DIAGNOSIS — E440 Moderate protein-calorie malnutrition: Secondary | ICD-10-CM | POA: Diagnosis not present

## 2023-01-28 DIAGNOSIS — N179 Acute kidney failure, unspecified: Secondary | ICD-10-CM | POA: Diagnosis not present

## 2023-01-28 DIAGNOSIS — A419 Sepsis, unspecified organism: Secondary | ICD-10-CM | POA: Diagnosis not present

## 2023-01-28 DIAGNOSIS — G40909 Epilepsy, unspecified, not intractable, without status epilepticus: Secondary | ICD-10-CM | POA: Diagnosis not present

## 2023-01-28 DIAGNOSIS — E1122 Type 2 diabetes mellitus with diabetic chronic kidney disease: Secondary | ICD-10-CM | POA: Diagnosis not present

## 2023-01-28 DIAGNOSIS — L97528 Non-pressure chronic ulcer of other part of left foot with other specified severity: Secondary | ICD-10-CM | POA: Diagnosis not present

## 2023-01-28 DIAGNOSIS — Z794 Long term (current) use of insulin: Secondary | ICD-10-CM | POA: Diagnosis not present

## 2023-01-28 DIAGNOSIS — Z7982 Long term (current) use of aspirin: Secondary | ICD-10-CM | POA: Diagnosis not present

## 2023-01-28 DIAGNOSIS — E1159 Type 2 diabetes mellitus with other circulatory complications: Secondary | ICD-10-CM | POA: Diagnosis not present

## 2023-01-28 DIAGNOSIS — N182 Chronic kidney disease, stage 2 (mild): Secondary | ICD-10-CM | POA: Diagnosis not present

## 2023-01-28 DIAGNOSIS — I152 Hypertension secondary to endocrine disorders: Secondary | ICD-10-CM | POA: Diagnosis not present

## 2023-01-28 DIAGNOSIS — N390 Urinary tract infection, site not specified: Secondary | ICD-10-CM | POA: Diagnosis not present

## 2023-01-28 DIAGNOSIS — E039 Hypothyroidism, unspecified: Secondary | ICD-10-CM | POA: Diagnosis not present

## 2023-01-28 DIAGNOSIS — L97518 Non-pressure chronic ulcer of other part of right foot with other specified severity: Secondary | ICD-10-CM | POA: Diagnosis not present

## 2023-01-29 ENCOUNTER — Other Ambulatory Visit: Payer: Self-pay

## 2023-01-29 ENCOUNTER — Encounter (HOSPITAL_BASED_OUTPATIENT_CLINIC_OR_DEPARTMENT_OTHER): Payer: Medicare HMO | Attending: Internal Medicine | Admitting: Internal Medicine

## 2023-01-29 DIAGNOSIS — E11621 Type 2 diabetes mellitus with foot ulcer: Secondary | ICD-10-CM | POA: Insufficient documentation

## 2023-01-29 DIAGNOSIS — L97518 Non-pressure chronic ulcer of other part of right foot with other specified severity: Secondary | ICD-10-CM | POA: Insufficient documentation

## 2023-01-29 DIAGNOSIS — N182 Chronic kidney disease, stage 2 (mild): Secondary | ICD-10-CM | POA: Diagnosis not present

## 2023-01-29 DIAGNOSIS — F0390 Unspecified dementia without behavioral disturbance: Secondary | ICD-10-CM | POA: Insufficient documentation

## 2023-01-29 DIAGNOSIS — E1151 Type 2 diabetes mellitus with diabetic peripheral angiopathy without gangrene: Secondary | ICD-10-CM | POA: Insufficient documentation

## 2023-01-29 DIAGNOSIS — L97528 Non-pressure chronic ulcer of other part of left foot with other specified severity: Secondary | ICD-10-CM | POA: Diagnosis not present

## 2023-01-29 DIAGNOSIS — I7025 Atherosclerosis of native arteries of other extremities with ulceration: Secondary | ICD-10-CM

## 2023-01-29 DIAGNOSIS — I739 Peripheral vascular disease, unspecified: Secondary | ICD-10-CM | POA: Diagnosis not present

## 2023-01-29 DIAGNOSIS — I129 Hypertensive chronic kidney disease with stage 1 through stage 4 chronic kidney disease, or unspecified chronic kidney disease: Secondary | ICD-10-CM | POA: Insufficient documentation

## 2023-01-29 DIAGNOSIS — E1122 Type 2 diabetes mellitus with diabetic chronic kidney disease: Secondary | ICD-10-CM | POA: Diagnosis not present

## 2023-01-29 NOTE — Progress Notes (Addendum)
HANA, ROCHFORD ([id number]) [id number] Page 1 of 9 Visit Report for 01/29/2023 Chief Complaint Document Details Patient Name: Date of Service: [name] [name], [name] [name] 01/29/2023 10:45 A M Medical Record Number: [id number] Patient Account Number: [id number] Date of Birth/Sex: Treating RN: December 20, 1933 (89 y.o. F) Primary Care Provider: Hillard Danker Other Clinician: Referring Provider: Treating Provider/Extender: Kizzie Furnish Weeks in Treatment: 7 Information Obtained from: Patient Chief Complaint 12/10/2022; small open wounds to the feet bilaterally Electronic Signature(s) Signed: 01/29/2023 12:42:51 PM By: Geralyn Corwin DO Entered By: Geralyn Corwin on 01/29/2023 11:49:49 -------------------------------------------------------------------------------- Debridement Details Patient Name: Date of Service: [name] Spruce M. 01/29/2023 10:45 A M Medical Record Number: [id number] Patient Account Number: [id number] Date of Birth/Sex: Treating RN: June 12, 1934 (89 y.o. F) Primary Care Provider: Hillard Danker Other Clinician: Referring Provider: Treating Provider/Extender: Kizzie Furnish Weeks in Treatment: 7 Debridement Performed for Assessment: Wound #4 Left,Medial Foot Performed By: Clinician Shawn Stall, RN Debridement Type: Chemical/Enzymatic/Mechanical Agent Used: Severity of Tissue Pre Debridement: Fat layer exposed Level of Consciousness (Pre-procedure): Awake and Alert Pre-procedure Verification/Time Out Yes - 11:05 Taken: Percent of Wound Bed Debrided: Instrument: Other : santyl Bleeding: None Response to Treatment: Procedure was tolerated well Level of Consciousness (Post- Awake and Alert procedure): Post Debridement Measurements of Total Wound Length: (cm) 0.2 Width: (cm) 0.2 Depth: (cm) 0.1 Volume: (cm) 0.003 Character of Wound/Ulcer Post Debridement: Stable Severity of Tissue Post Debridement: Fat layer  exposed Post Procedure Diagnosis Same as Pre-procedure Electronic Signature(s) Signed: 02/11/2023 10:45:04 AM By: Pearletha Alfred Signed: 02/11/2023 3:41:47 PM By: Janyce Llanos, BESSIE7/25/2024 3:41:47 PM By: Geralyn Corwin DO SignedJudie Petit ([id number]) [id number] Page 2 of 9 Entered By: Pearletha Alfred on 02/11/2023 10:45:04 -------------------------------------------------------------------------------- HPI Details Patient Name: Date of Service: [name] [name] 01/29/2023 10:45 A M Medical Record Number: [id number] Patient Account Number: [id number] Date of Birth/Sex: Treating RN: [birth year] (89 y.o. F) Primary Care Provider: Hillard Danker Other Clinician: Referring Provider: Treating Provider/Extender: Kizzie Furnish Weeks in Treatment: 7 History of Present Illness HPI Description: 12/10/2022 Ms. [name] is an 89 year old female with a past medical history of PAD, dementia, uncontrolled type 2 diabetes on insulin that presents the clinic for a 1 year history of wounds to her feet bilaterally. Daughter is present and helps provide the history. How the wounds have started is unclear. She has been using surgical shoes to offload the wounds. She has been using Santyl to the wound beds. She has slight erythema that daughter states is new around the periwound on the right and left great toe. She has a history of PAD and is followed with vein and vascular for this last seeing them on 10/2019. She had no wounds at that time but ABIs showed moderate right infrapopliteal artery occlusive disease and moderate multisegmental left lower extremity arterial occlusive disease. Patient has chronic pain to the wound sites. 6/4; send patient with wounds at the tip of her left and right first toes and the left medial MTP. We have been using Santyl to the wounds. 6/13; patient presents for follow-up. Patient has been using Santyl and Hydrofera Blue. She has  no issues or complaints. She is scheduled to see vein and vascular on 7/10. 7/12; patient presents for follow-up. She has been using Santyl and Hydrofera Blue to the wound beds. She saw vein and vascular on 7/10 and She had repeat ABIs that showed monophasic waveforms throughout the right and left lower extremity. Left worse than right. She had  dampened waveforms on the right and left toes. plan is for angiogram. She denies signs of infection. Left great toe wound is healed. Electronic Signature(s) Signed: 01/29/2023 12:42:51 PM By: Geralyn Corwin DO Entered By: Geralyn Corwin on 01/29/2023 11:50:50 -------------------------------------------------------------------------------- Physical Exam Details Patient Name: Date of Service: [name], [name] 01/29/2023 10:45 A M Medical Record Number: [id number] Patient Account Number: [id number] Date of Birth/Sex: Treating RN: 01/08/1934 (89 y.o. F) Primary Care Provider: Hillard Danker Other Clinician: Referring Provider: Treating Provider/Extender: Kizzie Furnish Weeks in Treatment: 7 Constitutional respirations regular, non-labored and within target range for patient.Marland Kitchen Psychiatric pleasant and cooperative. Notes T the left great toe there is epithelization to the previous wound site. T the medial left foot there is a bunion and a small open wound there with granulation o o tissue. Also to the right great toe distal aspect there is an open wound with granulation tissue present. No signs of surrounding infection including increased warmth, erythema or purulent drainage. Both feet are warm but difficult to palpate pedal pulses on the left. Can palpate pedal pulses on the right. Electronic Signature(s) Signed: 01/29/2023 12:42:51 PM By: Geralyn Corwin DO Entered By: Geralyn Corwin on 01/29/2023 11:54:41 Stoltzfus, Pincus Sanes ([id number]) [id number] Page 3 of  9 -------------------------------------------------------------------------------- Physician Orders Details Patient Name: Date of Service: [name] NATALLIA, KAPNER 01/29/2023 10:45 A M Medical Record Number: [id number] Patient Account Number: [id number] Date of Birth/Sex: Treating RN: Oct 05, 1933 (89 y.o. [name], [name] Primary Care Provider: Hillard Danker Other Clinician: Referring Provider: Treating Provider/Extender: Kizzie Furnish Weeks in Treatment: 7 Verbal / Phone Orders: No Diagnosis Coding ICD-10 Coding Code Description L97.518 Non-pressure chronic ulcer of other part of right foot with other specified severity L97.528 Non-pressure chronic ulcer of other part of left foot with other specified severity E11.621 Type 2 diabetes mellitus with foot ulcer F03.90 Unspecified dementia, unspecified severity, without behavioral disturbance, psychotic disturbance, mood disturbance, and anxiety I73.9 Peripheral vascular disease, unspecified Follow-up Appointments ppointment in 2 weeks. - Dr. Mikey Bussing ROOM 8 Return A Thursday 02/11/2023 1:30pm Anesthetic (In clinic) Topical Lidocaine 4% applied to wound bed Bathing/ Shower/ Hygiene Other Bathing/Shower/Hygiene Orders/Instructions: - Bathe feet with soap and water as tolerated. (please do not soak the feet) Off-Loading Other: - Float feet with a pillow under the calf while in bed Home Health No change in wound care orders this week; continue Home Health for wound care. May utilize formulary equivalent dressing for wound treatment orders unless otherwise specified. - Please use Santyl on wound bed and then place Hydrafera Blue Ready on Santyl for the 3 wounds. Dressing changes to be completed by Home Health on Monday / Wednesday / Friday except when patient has scheduled visit [facility]. - Or 3 times a week for Home Health Other Home Health Orders/Instructions: - [facility] Wound Treatment Wound #3 - T Great oe Wound  Laterality: Right, Distal Cleanser: Soap and Water 1 x Per Day/30 Days Discharge Instructions: May shower and wash wound with dial antibacterial soap and water prior to dressing change. Cleanser: Wound Cleanser 1 x Per Day/30 Days Discharge Instructions: Cleanse the wound with wound cleanser prior to applying a clean dressing using gauze sponges, not tissue or cotton balls. Prim Dressing: Hydrofera Blue Ready Transfer Foam, 2.5x2.5 (in/in) 1 x Per Day/30 Days ary Discharge Instructions: Apply directly to wound bed as directed Prim Dressing: Santyl Ointment 1 x Per Day/30 Days ary Discharge Instructions: Apply nickel thick amount to wound bed as instructed Secured  With: Conforming Stretch Gauze Bandage, Sterile 2x75 (in/in) 1 x Per Day/30 Days Discharge Instructions: Secure with stretch gauze as directed. Secured With: 98M Medipore Scientist, research (life sciences) Surgical T 2x10 (in/yd) 1 x Per Day/30 Days ape Discharge Instructions: Secure with tape as directed. Wound #4 - Foot Wound Laterality: Left, Medial Cleanser: Soap and Water 1 x Per Day/30 Days Discharge Instructions: May shower and wash wound with dial antibacterial soap and water prior to dressing change. Cleanser: Wound Cleanser 1 x Per Day/30 Days Discharge Instructions: Cleanse the wound with wound cleanser prior to applying a clean dressing using gauze sponges, not tissue or cotton balls. Prim Dressing: Hydrofera Blue Ready Transfer Foam, 2.5x2.5 (in/in) ary 1 x Per Day/30 Days LASHANNON, TORTORICH ([id number]) [id number] Page 4 of 9 Discharge Instructions: Apply directly to wound bed as directed Prim Dressing: Santyl Ointment 1 x Per Day/30 Days ary Discharge Instructions: Apply nickel thick amount to wound bed as instructed Secondary Dressing: Bordered Gauze, 2x2 in 1 x Per Day/30 Days Discharge Instructions: Apply over primary dressing as directed. Electronic Signature(s) Signed: 01/29/2023 12:42:51 PM By: Geralyn Corwin DO Entered By: Geralyn Corwin on 01/29/2023 11:54:49 -------------------------------------------------------------------------------- Problem List Details Patient Name: Date of Service: [name] Spruce M. 01/29/2023 10:45 A M Medical Record Number: [id number] Patient Account Number: [id number] Date of Birth/Sex: Treating RN: [birth year] (89 y.o. F) Primary Care Provider: Hillard Danker Other Clinician: Referring Provider: Treating Provider/Extender: Kizzie Furnish Weeks in Treatment: 7 Active Problems ICD-10 Encounter Code Description Active Date MDM Diagnosis L97.518 Non-pressure chronic ulcer of other part of right foot with other specified 12/10/2022 No Yes severity L97.528 Non-pressure chronic ulcer of other part of left foot with other specified 12/10/2022 No Yes severity E11.621 Type 2 diabetes mellitus with foot ulcer 12/10/2022 No Yes F03.90 Unspecified dementia, unspecified severity, without behavioral disturbance, 12/10/2022 No Yes psychotic disturbance, mood disturbance, and anxiety I73.9 Peripheral vascular disease, unspecified 12/10/2022 No Yes Inactive Problems Resolved Problems Electronic Signature(s) Signed: 01/29/2023 12:42:51 PM By: Geralyn Corwin DO Entered By: Geralyn Corwin on 01/29/2023 11:49:35 Rauf, Pincus Sanes ([id number]) [id number] Page 5 of 9 -------------------------------------------------------------------------------- Progress Note Details Patient Name: Date of Service: [name] MADISEN, ZDROJEWSKI 01/29/2023 10:45 A M Medical Record Number: [id number] Patient Account Number: [id number] Date of Birth/Sex: Treating RN: 1934-01-08 (89 y.o. F) Primary Care Provider: Hillard Danker Other Clinician: Referring Provider: Treating Provider/Extender: Kizzie Furnish Weeks in Treatment: 7 Subjective Chief Complaint Information obtained from Patient 12/10/2022; small open wounds to the feet bilaterally History  of Present Illness (HPI) 12/10/2022 Ms. Chylee Konkel is an 89 year old female with a past medical history of PAD, dementia, uncontrolled type 2 diabetes on insulin that presents the clinic for a 1 year history of wounds to her feet bilaterally. Daughter is present and helps provide the history. How the wounds have started is unclear. She has been using surgical shoes to offload the wounds. She has been using Santyl to the wound beds. She has slight erythema that daughter states is new around the periwound on the right and left great toe. She has a history of PAD and is followed with vein and vascular for this last seeing them on 10/2019. She had no wounds at that time but ABIs showed moderate right infrapopliteal artery occlusive disease and moderate multisegmental left lower extremity arterial occlusive disease. Patient has chronic pain to the wound sites. 6/4; send patient with wounds at the tip of her left and right first toes and the left medial MTP. We  have been using Santyl to the wounds. 6/13; patient presents for follow-up. Patient has been using Santyl and Hydrofera Blue. She has no issues or complaints. She is scheduled to see vein and vascular on 7/10. 7/12; patient presents for follow-up. She has been using Santyl and Hydrofera Blue to the wound beds. She saw vein and vascular on 7/10 and She had repeat ABIs that showed monophasic waveforms throughout the right and left lower extremity. Left worse than right. She had dampened waveforms on the right and left toes. plan is for angiogram. She denies signs of infection. Left great toe wound is healed. Patient History Information obtained from Caregiver, Chart. Family History Cancer - Siblings, Diabetes - Mother,Siblings, Hypertension - Mother, Lung Disease - Father. Social History Never smoker, Marital Status - Widowed, Alcohol Use - Never, Drug Use - No History, Caffeine Use - Rarely - coffee. Medical History Cardiovascular Patient has  history of Hypertension, Hypotension - orthostatic, Peripheral Arterial Disease, Peripheral Venous Disease Gastrointestinal Patient has history of Colitis Endocrine Patient has history of Type II Diabetes Neurologic Patient has history of Dementia, Seizure Disorder Hospitalization/Surgery History - loop recorder insertion 2021. - 2018 laparotomy. - abdominal hysterectomy. - knee arthroscopy. - vocal cord polyps. Medical A Surgical History Notes nd Ear/Nose/Mouth/Throat dysphagia Cardiovascular hyperlipidemia, Gastrointestinal GERD viral gastroenteritis small bower obstruction 2018 Endocrine hypothyroidism, Genitourinary CKD II urinary retention Integumentary (Skin) eczema Musculoskeletal degenerative joint disease osteoporosis Neurologic delirium encephalopathy TAKYRAH, HOLLINGSWORTH ([id number]) [id number] Page 6 of 9 Objective Constitutional respirations regular, non-labored and within target range for patient.. Vitals Time Taken: 11:26 AM, Height: 66 in, Weight: 110 lbs, BMI: 17.8, Temperature: 98.6 F, Pulse: 72 bpm, Respiratory Rate: 16 breaths/min, Blood Pressure: 158/76 mmHg. Psychiatric pleasant and cooperative. General Notes: T the left great toe there is epithelization to the previous wound site. T the medial left foot there is a bunion and a small open wound there with o o granulation tissue. Also to the right great toe distal aspect there is an open wound with granulation tissue present. No signs of surrounding infection including increased warmth, erythema or purulent drainage. Both feet are warm but difficult to palpate pedal pulses on the left. Can palpate pedal pulses on the right. Integumentary (Hair, Skin) Wound #3 status is Open. Original cause of wound was Gradually Appeared. The date acquired was: 12/09/2021. The wound has been in treatment 7 weeks. The wound is located on the Right,Distal T [place]. The wound measures 0.4cm length x  0.5cm width x 0.1cm depth; 0.157cm^2 area and 0.016cm^3 volume. There oe is Fat Layer (Subcutaneous Tissue) exposed. There is no tunneling or undermining noted. There is a medium amount of serosanguineous drainage noted. The wound margin is fibrotic, thickened scar. There is small (1-33%) red granulation within the wound bed. There is a large (67-100%) amount of necrotic tissue within the wound bed including Adherent Slough. The periwound skin appearance had no abnormalities noted for texture. The periwound skin appearance had no abnormalities noted for moisture. The periwound skin appearance had no abnormalities noted for color. Periwound temperature was noted as No Abnormality. Wound #4 status is Open. Original cause of wound was Gradually Appeared. The date acquired was: 12/09/2021. The wound has been in treatment 7 weeks. The wound is located on the Left,Medial Foot. The wound measures 0.2cm length x 0.2cm width x 0.1cm depth; 0.031cm^2 area and 0.003cm^3 volume. There is no tunneling or undermining noted. There is a none present amount of drainage noted. The wound margin is distinct with  the outline attached to the wound base. There is medium (34-66%) red granulation within the wound bed. There is a medium (34-66%) amount of necrotic tissue within the wound bed including Adherent Slough. The periwound skin appearance had no abnormalities noted for texture. The periwound skin appearance had no abnormalities noted for moisture. The periwound skin appearance had no abnormalities noted for color. Periwound temperature was noted as No Abnormality. Wound #5 status is Healed - Epithelialized. Original cause of wound was Gradually Appeared. The date acquired was: 12/09/2021. The wound has been in treatment 7 weeks. The wound is located on the Left T Great. The wound measures 0cm length x 0cm width x 0cm depth; 0cm^2 area and 0cm^3 volume. oe There is no tunneling or undermining noted. There is a none  present amount of drainage noted. The wound margin is distinct with the outline attached to the wound base. There is no granulation within the wound bed. There is no necrotic tissue within the wound bed. The periwound skin appearance had no abnormalities noted for texture. The periwound skin appearance had no abnormalities noted for moisture. The periwound skin appearance had no abnormalities noted for color. Periwound temperature was noted as No Abnormality. Assessment Active Problems ICD-10 Non-pressure chronic ulcer of other part of right foot with other specified severity Non-pressure chronic ulcer of other part of left foot with other specified severity Type 2 diabetes mellitus with foot ulcer Unspecified dementia, unspecified severity, without behavioral disturbance, psychotic disturbance, mood disturbance, and anxiety Peripheral vascular disease, unspecified Patient's left great toe wound appears healed. The other remaining wounds appear stable. No signs of infection. She saw vein and vascular on 7/10 and due to ABI results patient is scheduled to have an arteriogram of the lower extremities for potential revascularization. For now I recommended continuing the course with Santyl and Hydrofera Blue. Continue aggressive offloading with surgical shoes. Follow-up in 2 to 3 weeks. Plan Follow-up Appointments: Return Appointment in 2 weeks. - Dr. Mikey Bussing ROOM 8 Thursday 02/11/2023 1:30pm Anesthetic: (In clinic) Topical Lidocaine 4% applied to wound bed Bathing/ Shower/ Hygiene: Other Bathing/Shower/Hygiene Orders/Instructions: - Bathe feet with soap and water as tolerated. (please do not soak the feet) Off-Loading: Other: - Float feet with a pillow under the calf while in bed Home Health: No change in wound care orders this week; continue Home Health for wound care. May utilize formulary equivalent dressing for wound treatment orders unless otherwise specified. - Please use Santyl on wound  bed and then place Hydrafera Blue Ready on Santyl for the 3 wounds. Dressing changes to be completed by Home Health on Monday / Wednesday / Friday except when patient has scheduled visit at [facility]. - Or 3 times a week for Home Health Other Home Health Orders/Instructions: - [facility] WOUND #3: - T Great Wound Laterality: Right, Distal oe Cleanser: Soap and Water 1 x Per Day/30 Days Discharge Instructions: May shower and wash wound with dial antibacterial soap and water prior to dressing change. Cleanser: Wound Cleanser 1 x Per Day/30 Days Discharge Instructions: Cleanse the wound with wound cleanser prior to applying a clean dressing using gauze sponges, not tissue or cotton balls. Prim Dressing: Hydrofera Blue Ready Transfer Foam, 2.5x2.5 (in/in) 1 x Per Day/30 Days ary Discharge Instructions: Apply directly to wound bed as directed Prim Dressing: Santyl Ointment 1 x Per Day/30 Days ary Discharge Instructions: Apply nickel thick amount to wound bed as instructed LATISIA, WRZESINSKI ([id number]) [id number] Page 7 of 9 Secured With: Materials engineer  Gauze Bandage, Sterile 2x75 (in/in) 1 x Per Day/30 Days Discharge Instructions: Secure with stretch gauze as directed. Secured With: 77M Medipore Scientist, research (life sciences) Surgical T 2x10 (in/yd) 1 x Per Day/30 Days ape Discharge Instructions: Secure with tape as directed. WOUND #4: - Foot Wound Laterality: Left, Medial Cleanser: Soap and Water 1 x Per Day/30 Days Discharge Instructions: May shower and wash wound with dial antibacterial soap and water prior to dressing change. Cleanser: Wound Cleanser 1 x Per Day/30 Days Discharge Instructions: Cleanse the wound with wound cleanser prior to applying a clean dressing using gauze sponges, not tissue or cotton balls. Prim Dressing: Hydrofera Blue Ready Transfer Foam, 2.5x2.5 (in/in) 1 x Per Day/30 Days ary Discharge Instructions: Apply directly to wound bed as directed Prim  Dressing: Santyl Ointment 1 x Per Day/30 Days ary Discharge Instructions: Apply nickel thick amount to wound bed as instructed Secondary Dressing: Bordered Gauze, 2x2 in 1 x Per Day/30 Days Discharge Instructions: Apply over primary dressing as directed. 1. Hydrofera Blue and Santyl 2. Aggressive offloading 3. Follow-up in 2 to 3 weeks Electronic Signature(s) Signed: 01/29/2023 12:42:51 PM By: Geralyn Corwin DO Entered By: Geralyn Corwin on 01/29/2023 11:55:43 -------------------------------------------------------------------------------- HxROS Details Patient Name: Date of Service: [name] RRIS, Kyarah M. 01/29/2023 10:45 A M Medical Record Number: [id number] Patient Account Number: [id number] Date of Birth/Sex: Treating RN: March 31, 1934 (88 y.o. F) Primary Care Provider: Hillard Danker Other Clinician: Referring Provider: Treating Provider/Extender: Kizzie Furnish Weeks in Treatment: 7 Information Obtained From Caregiver Chart Ear/Nose/Mouth/Throat Medical History: Past Medical History Notes: dysphagia Cardiovascular Medical History: Positive for: Hypertension; Hypotension - orthostatic; Peripheral Arterial Disease; Peripheral Venous Disease Past Medical History Notes: hyperlipidemia, Gastrointestinal Medical History: Positive for: Colitis Past Medical History Notes: GERD viral gastroenteritis small bower obstruction 2018 Endocrine Medical History: Positive for: Type II Diabetes Past Medical History Notes: hypothyroidism, Time with diabetes: 40 years Treated with: Insulin Blood sugar tested every day: Yes Tested : 3 x day Blood sugar testing results: Breakfast: 200 MINELLY, CAROL ([id number]) [id number] Page 8 of 9 Genitourinary Medical History: Past Medical History Notes: CKD II urinary retention Integumentary (Skin) Medical History: Past Medical History Notes: eczema Musculoskeletal Medical History: Past Medical  History Notes: degenerative joint disease osteoporosis Neurologic Medical History: Positive for: Dementia; Seizure Disorder Past Medical History Notes: delirium encephalopathy Immunizations Pneumococcal Vaccine: Received Pneumococcal Vaccination: Yes Received Pneumococcal Vaccination On or After 60th Birthday: Yes Implantable Devices Yes Hospitalization / Surgery History Type of Hospitalization/Surgery loop recorder insertion 2021 2018 laparotomy abdominal hysterectomy knee arthroscopy vocal cord polyps Family and Social History Cancer: Yes - Siblings; Diabetes: Yes - Mother,Siblings; Hypertension: Yes - Mother; Lung Disease: Yes - Father; Never smoker; Marital Status - Widowed; Alcohol Use: Never; Drug Use: No History; Caffeine Use: Rarely - coffee; Financial Concerns: No; Food, Clothing or Shelter Needs: No; Support System Lacking: No; Transportation Concerns: No Electronic Signature(s) Signed: 01/29/2023 12:42:51 PM By: Geralyn Corwin DO Entered By: Geralyn Corwin on 01/29/2023 11:50:54 -------------------------------------------------------------------------------- SuperBill Details Patient Name: Date of Service: [name] RRIS, Lena M. 01/29/2023 Medical Record Number: [id number] Patient Account Number: [id number] Date of Birth/Sex: Treating RN: 1934-03-21 (89 y.o. F) Primary Care Provider: Hillard Danker Other Clinician: Referring Provider: Treating Provider/Extender: Kizzie Furnish Weeks in Treatment: 7 Diagnosis Coding ICD-10 Codes Code Description L97.518 Non-pressure chronic ulcer of other part of right foot with other specified severity SHARY, FENG ([id number]) [id number] Page 9 of 9 L97.528 Non-pressure chronic ulcer of other part of left foot with other specified severity  E11.621 Type 2 diabetes mellitus with foot ulcer F03.90 Unspecified dementia, unspecified severity, without behavioral disturbance, psychotic  disturbance, mood disturbance, and anxiety I73.9 Peripheral vascular disease, unspecified Facility Procedures : CPT4 Code: [id number] Description: [phone] - DEBRIDE W/O ANES NON SELECT ICD-10 Diagnosis Description L97.518 Non-pressure chronic ulcer of other part of right foot with other specified seve L97.528 Non-pressure chronic ulcer of other part of left foot with other specified  sever E11.621 Type 2 diabetes mellitus with foot ulcer I73.9 Peripheral vascular disease, unspecified Modifier: rity ity Quantity: 1 Physician Procedures : CPT4 Code Description Modifier [id number] - WC PHYS LEVEL 3 - EST PT ICD-10 Diagnosis Description L97.518 Non-pressure chronic ulcer of other part of right foot with other specified severity L97.528 Non-pressure chronic ulcer of other part of left  foot with other specified severity E11.621 Type 2 diabetes mellitus with foot ulcer I73.9 Peripheral vascular disease, unspecified Quantity: 1 Electronic Signature(s) Signed: 02/11/2023 10:45:37 AM By: Pearletha Alfred Signed: 02/11/2023 3:41:47 PM By: Geralyn Corwin DO Previous Signature: 01/29/2023 12:42:51 PM Version By: Geralyn Corwin DO Entered By: Pearletha Alfred on 02/11/2023 10:45:37

## 2023-01-29 NOTE — Progress Notes (Signed)
DIVIJA, TROUTT ([id number]) [id number] Page 1 of 9 Visit Report for 01/29/2023 Arrival Information Details Patient Name: Date of Service: [name] [name], [name] [name] 01/29/2023 10:45 A M Medical Record Number: [id number] Patient Account Number: [id number] Date of Birth/Sex: Treating RN: [name] (89 y.o. F) Primary Care [name] [name]: [name] [name] Other Clinician: Referring [name] [name]: Treating [name] [name]/[name] [name]: [name] [name] Weeks in Treatment: 7 Visit Information History Since Last Visit Added or deleted any medications: No Patient Arrived: Wheel Chair Any new allergies or adverse reactions: No Arrival Time: 11:22 Had a fall or experienced change in No Accompanied By: caregiver activities of daily living that may affect Transfer Assistance: None risk of falls: Patient Identification Verified: Yes Signs or symptoms of abuse/neglect since last visito No Secondary Verification Process Completed: Yes Hospitalized since last visit: No Patient Requires Transmission-Based Precautions: No Implantable device outside of the clinic excluding No Patient Has Alerts: No cellular tissue based products placed in the center since last visit: Has Dressing in Place as Prescribed: Yes Pain Present Now: Yes Electronic Signature(s) Signed: 01/29/2023 1:10:34 PM By: [name] [name] Entered By: [name] [name] on 01/29/2023 11:23:55 -------------------------------------------------------------------------------- Encounter Discharge Information Details Patient Name: Date of Service: [name] Spruce M. 01/29/2023 10:45 A M Medical Record Number: [id number] Patient Account Number: [id number] Date of Birth/Sex: Treating RN: [name] [name] (89 y.o. [name] [name] Primary Care Ken Bonn: [name] [name] Other Clinician: Referring [name] [name]: Treating [name] [name]/[name] [name]: [name] [name] Weeks in Treatment: 7 Encounter Discharge Information Items Discharge Condition:  Stable Ambulatory Status: Wheelchair Discharge Destination: Home Transportation: Private Auto Accompanied By: daughter Schedule Follow-up Appointment: Yes Clinical Summary of Care: Patient Declined Electronic Signature(s) Signed: 01/29/2023 1:48:17 PM By: [name] Schwalbe RN Entered By: [name] [name] on 01/29/2023 13:25:31 Wery, [name] [name] ([id number]) [id number] Page 2 of 9 -------------------------------------------------------------------------------- Lower Extremity Assessment Details Patient Name: Date of Service: [name] [name], [name] [name] 01/29/2023 10:45 A M Medical Record Number: [id number] Patient Account Number: [id number] Date of Birth/Sex: Treating RN: [name] (89 y.o. F) Primary Care [name] [name]: [name] [name] Other Clinician: Referring [name] [name]: Treating [name] [name]/[name] [name]: [name] [name] Weeks in Treatment: 7 Edema Assessment Assessed: [Left: No] [Right: No] Edema: [Left: No] [Right: No] Calf Left: Right: Point of Measurement: 29 cm From Medial Instep 24.5 cm 25 cm Ankle Left: Right: Point of Measurement: 10 cm From Medial Instep 17.5 cm 17 cm Vascular Assessment Pulses: Dorsalis Pedis Palpable: [Left:Yes] [Right:Yes] Extremity colors, hair growth, and conditions: Extremity Color: [Left:Normal] [Right:Normal] Hair Growth on Extremity: [Left:No] [Right:No] Temperature of Extremity: [Left:Cool] [Right:Cool] Capillary Refill: [Left:< 3 seconds] [Right:< 3 seconds] Dependent Rubor: [Left:No] [Right:No] Blanched when Elevated: [Left:No No] [Right:No No] Toe Nail Assessment Left: Right: Thick: No No Discolored: No No Deformed: No No Improper Length and Hygiene: No No Electronic Signature(s) Signed: 01/29/2023 1:10:34 PM By: [name] [name] Entered By: [name] [name] on 01/29/2023 11:32:08 -------------------------------------------------------------------------------- Multi Wound Chart Details Patient Name: Date of Service: [name] Spruce M. 01/29/2023 10:45 A M Medical Record Number: [id number] Patient Account Number: [id number] Date of Birth/Sex: Treating RN: [name] (89 y.o. F) Primary Care Dellis [name]: [name] [name] Other Clinician: Referring [name] [name]: Treating [name] [name]/[name] [name]: [name] [name] Weeks in Treatment: 7 Vital Signs Height(in): 66 Pulse(bpm): 72 Weight(lbs): 110 Blood Pressure(mmHg): 158/76 [name] [name] ([id number]) [phone].pdf Page 3 of 9 Body Mass Index(BMI): 17.8 Temperature(F): 98.6 Respiratory Rate(breaths/min): 16 [3:Photos:] Right, Distal T Great oe Left, Medial Foot Left T Great oe Wound Location: Gradually Appeared Gradually Appeared Gradually Appeared Wounding Event: Diabetic Wound/Ulcer of the Lower Diabetic Wound/Ulcer of  the Lower Diabetic Wound/Ulcer of the Lower Primary Etiology: Extremity Extremity Extremity Hypertension, Hypotension, Peripheral Hypertension, Hypotension, Peripheral Hypertension, Hypotension, Peripheral Comorbid History: Arterial Disease, Peripheral Venous Arterial Disease, Peripheral Venous Arterial Disease, Peripheral Venous Disease, Colitis, Type II Diabetes, Disease, Colitis, Type II Diabetes, Disease, Colitis, Type II Diabetes, Dementia, Seizure Disorder Dementia, Seizure Disorder Dementia, Seizure Disorder 12/09/2021 12/09/2021 12/09/2021 Date Acquired: 7 7 7  Weeks of Treatment: Open Open Healed - Epithelialized Wound Status: No No No Wound Recurrence: 0.4x0.5x0.1 0.2x0.2x0.1 0x0x0 Measurements L x W x D (cm) 0.157 0.031 0 A (cm) : rea 0.016 0.003 0 Volume (cm) : 75.00% 67.00% 100.00% % Reduction in A rea: 74.60% 66.70% 100.00% % Reduction in Volume: Grade 1 Grade 1 Grade 1 Classification: Medium None Present None Present Exudate A mount: Serosanguineous N/A N/A Exudate Type: red, brown N/A N/A Exudate Color: Fibrotic scar, thickened scar Distinct, outline attached Distinct, outline  attached Wound Margin: Small (1-33%) Medium (34-66%) None Present (0%) Granulation A mount: Red Red N/A Granulation Quality: Large (67-100%) Medium (34-66%) None Present (0%) Necrotic A mount: Fat Layer (Subcutaneous Tissue): Yes Fascia: No Fascia: No Exposed Structures: Fascia: No Fat Layer (Subcutaneous Tissue): No Fat Layer (Subcutaneous Tissue): No Tendon: No Tendon: No Tendon: No Muscle: No Muscle: No Muscle: No Joint: No Joint: No Joint: No Bone: No Bone: No Bone: No None None None Epithelialization: No Abnormalities Noted No Abnormalities Noted No Abnormalities Noted Periwound Skin Texture: No Abnormalities Noted No Abnormalities Noted No Abnormalities Noted Periwound Skin Moisture: No Abnormalities Noted No Abnormalities Noted No Abnormalities Noted Periwound Skin Color: No Abnormality No Abnormality No Abnormality Temperature: Treatment Notes Electronic Signature(s) Signed: 01/29/2023 12:42:51 PM By: Geralyn Corwin DO Entered By: Geralyn Corwin on 01/29/2023 11:49:41 -------------------------------------------------------------------------------- Multi-Disciplinary Care Plan Details Patient Name: Date of Service: [name] Spruce M. 01/29/2023 10:45 A M Medical Record Number: [id number] Patient Account Number: [id number] Date of Birth/Sex: Treating RN: [name] (89 y.o. [name] Primary Care Shiv Shuey: [name] [name] Other Clinician: Referring Emanuela Runnion: Treating Nikkita Adeyemi/[name] [name]: [name] [name] Weeks in Treatment: [address] AILAINA, KALP [place] ([id number]) [id number] Page 4 of 9 Wound/Skin Impairment Nursing Diagnoses: Impaired tissue integrity Goals: Patient/caregiver will verbalize understanding of skin care regimen Date Initiated: 12/10/2022 Target Resolution Date: 02/17/2023 Goal Status: Active Interventions: Assess ulceration(s) every visit Treatment Activities: Skin care regimen initiated :  12/10/2022 Notes: Electronic Signature(s) Signed: 01/29/2023 1:53:12 PM By: Shawn Stall RN, BSN Entered By: Shawn Stall on 01/29/2023 11:40:50 -------------------------------------------------------------------------------- Pain Assessment Details Patient Name: Date of Service: [name] Spruce M. 01/29/2023 10:45 A M Medical Record Number: [id number] Patient Account Number: [id number] Date of Birth/Sex: Treating RN: [name] (89 y.o. F) Primary Care Aahil Fredin: [name] [name] Other Clinician: Referring Trenika Hudson: Treating Dajha Urquilla/[name] [name]: [name] [name] Weeks in Treatment: 7 Active Problems Location of Pain Severity and Description of Pain Patient Has Paino Yes Site Locations Pain Location: Pain in Ulcers Rate the pain. Current Pain Level: 8 Pain Management and Medication Current Pain Management: Electronic Signature(s) Signed: 01/29/2023 1:10:34 PM By: [name] [name] Entered By: [name] [name] on 01/29/2023 11:24:19 Dianah Field ([id number]) [id number] Page 5 of 9 -------------------------------------------------------------------------------- Patient/Caregiver Education Details Patient Name: Date of Service: [name] [name], [name] [name] 7/12/2024andnbsp10:45 A M Medical Record Number: [id number] Patient Account Number: [id number] Date of Birth/Gender: Treating RN: [name] (89 y.o. [name] Primary Care Physician: [name] [name] Other Clinician: Referring Physician: Treating Physician/[name] [name]: [name] [name] Weeks in Treatment: 7 Education Assessment Education Provided To: Patient Education Topics Provided Wound/Skin  Impairment: Handouts: Caring for Your Ulcer Methods: Explain/Verbal Responses: Reinforcements needed Electronic Signature(s) Signed: 01/29/2023 1:53:12 PM By: Shawn Stall RN, BSN Entered By: Shawn Stall on 01/29/2023  11:41:01 -------------------------------------------------------------------------------- Wound Assessment Details Patient Name: Date of Service: [name] Spruce M. 01/29/2023 10:45 A M Medical Record Number: [id number] Patient Account Number: [id number] Date of Birth/Sex: Treating RN: [name] (89 y.o. F) Primary Care Meya Clutter: [name] [name] Other Clinician: Referring Gentry Seeber: Treating Seith Aikey/[name] [name]: [name] [name] Weeks in Treatment: 7 Wound Status Wound Number: 3 Primary Diabetic Wound/Ulcer of the Lower Extremity Etiology: Wound Location: Right, Distal T Great oe Wound Open Wounding Event: Gradually Appeared Status: Date Acquired: 12/09/2021 Comorbid Hypertension, Hypotension, Peripheral Arterial Disease, Peripheral Weeks Of Treatment: 7 History: Venous Disease, Colitis, Type II Diabetes, Dementia, Seizure Clustered Wound: No Disorder Photos Wound Measurements Length: (cm) 0.4 Leazer, Tempestt M ([id number]) Width: (cm) 0 Depth: (cm) 0 Area: (cm) Volume: (cm) % Reduction in Area: 75% [id number] Page 6 of 9 .5 % Reduction in Volume: 74.6% .1 Epithelialization: None 0.157 Tunneling: No 0.016 Undermining: No Wound Description Classification: Grade 1 Wound Margin: Fibrotic scar, thickened scar Exudate Amount: Medium Exudate Type: Serosanguineous Exudate Color: red, brown Foul Odor After Cleansing: No Slough/Fibrino Yes Wound Bed Granulation Amount: Small (1-33%) Exposed Structure Granulation Quality: Red Fascia Exposed: No Necrotic Amount: Large (67-100%) Fat Layer (Subcutaneous Tissue) Exposed: Yes Necrotic Quality: Adherent Slough Tendon Exposed: No Muscle Exposed: No Joint Exposed: No Bone Exposed: No Periwound Skin Texture Texture Color No Abnormalities Noted: Yes No Abnormalities Noted: Yes Moisture Temperature / Pain No Abnormalities Noted: Yes Temperature: No Abnormality Treatment Notes Wound #3 (Toe  Great) Wound Laterality: Right, Distal Cleanser Soap and Water Discharge Instruction: May shower and wash wound with dial antibacterial soap and water prior to dressing change. Wound Cleanser Discharge Instruction: Cleanse the wound with wound cleanser prior to applying a clean dressing using gauze sponges, not tissue or cotton balls. Peri-Wound Care Topical Primary Dressing Hydrofera Blue Ready Transfer Foam, 2.5x2.5 (in/in) Discharge Instruction: Apply directly to wound bed as directed Santyl Ointment Discharge Instruction: Apply nickel thick amount to wound bed as instructed Secondary Dressing Secured With Conforming Stretch Gauze Bandage, Sterile 2x75 (in/in) Discharge Instruction: Secure with stretch gauze as directed. 3M Medipore Soft Cloth Surgical T 2x10 (in/yd) ape Discharge Instruction: Secure with tape as directed. Compression Wrap Compression Stockings Add-Ons Electronic Signature(s) Signed: 01/29/2023 1:48:17 PM By: [name] Schwalbe RN Entered By: [name] [name] on 01/29/2023 11:46:02 Flamenco, [name] [name] ([id number]) [id number] Page 7 of 9 -------------------------------------------------------------------------------- Wound Assessment Details Patient Name: Date of Service: [name] [name], [name] [name] 01/29/2023 10:45 A M Medical Record Number: [id number] Patient Account Number: [id number] Date of Birth/Sex: Treating RN: [name] (89 y.o. F) Primary Care Zaylon Bossier: [name] [name] Other Clinician: Referring Sherrine Salberg: Treating Mohan Erven/[name] [name]: [name] [name] Weeks in Treatment: 7 Wound Status Wound Number: 4 Primary Diabetic Wound/Ulcer of the Lower Extremity Etiology: Wound Location: Left, Medial Foot Wound Open Wounding Event: Gradually Appeared Status: Date Acquired: 12/09/2021 Comorbid Hypertension, Hypotension, Peripheral Arterial Disease, Peripheral Weeks Of Treatment: 7 History: Venous Disease, Colitis, Type II Diabetes,  Dementia, Seizure Clustered Wound: No Disorder Photos Wound Measurements Length: (cm) 0.2 Width: (cm) 0.2 Depth: (cm) 0.1 Area: (cm) 0.031 Volume: (cm) 0.003 % Reduction in Area: 67% % Reduction in Volume: 66.7% Epithelialization: None Tunneling: No Undermining: No Wound Description Classification: Grade 1 Wound Margin: Distinct, outline attached Exudate Amount: None Present Foul Odor After Cleansing: No Slough/Fibrino Yes Wound Bed Granulation Amount: Medium (34-66%) Exposed Structure Granulation  Quality: Red Fascia Exposed: No Necrotic Amount: Medium (34-66%) Fat Layer (Subcutaneous Tissue) Exposed: No Necrotic Quality: Adherent Slough Tendon Exposed: No Muscle Exposed: No Joint Exposed: No Bone Exposed: No Periwound Skin Texture Texture Color No Abnormalities Noted: Yes No Abnormalities Noted: Yes Moisture Temperature / Pain No Abnormalities Noted: Yes Temperature: No Abnormality Treatment Notes Wound #4 (Foot) Wound Laterality: Left, Medial Cleanser Soap and Water Discharge Instruction: May shower and wash wound with dial antibacterial soap and water prior to dressing change. Wound Cleanser Discharge Instruction: Cleanse the wound with wound cleanser prior to applying a clean dressing using gauze sponges, not tissue or cotton balls. Peri-Wound Care Topical ARIELY, NOSTRAND ([id number]) [id number] Page 8 of 9 Primary Dressing Hydrofera Blue Ready Transfer Foam, 2.5x2.5 (in/in) Discharge Instruction: Apply directly to wound bed as directed Santyl Ointment Discharge Instruction: Apply nickel thick amount to wound bed as instructed Secondary Dressing Bordered Gauze, 2x2 in Discharge Instruction: Apply over primary dressing as directed. Secured With Compression Wrap Compression Stockings Facilities manager) Signed: 01/29/2023 1:10:34 PM By: [name] [name] Entered By: [name] [name] on 01/29/2023  11:33:10 -------------------------------------------------------------------------------- Wound Assessment Details Patient Name: Date of Service: [name] [name], [name] [name] 01/29/2023 10:45 A M Medical Record Number: [id number] Patient Account Number: [id number] Date of Birth/Sex: Treating RN: [name][birth year] (89 y.o. F) Primary Care Chaunice Obie: [name] [name] Other Clinician: Referring Kizzy Olafson: Treating Mayte Diers/[name] [name]: [name] [name] Weeks in Treatment: 7 Wound Status Wound Number: 5 Primary Diabetic Wound/Ulcer of the Lower Extremity Etiology: Wound Location: Left T Great oe Wound Healed - Epithelialized Wounding Event: Gradually Appeared Status: Date Acquired: 12/09/2021 Comorbid Hypertension, Hypotension, Peripheral Arterial Disease, Peripheral Weeks Of Treatment: 7 History: Venous Disease, Colitis, Type II Diabetes, Dementia, Seizure Clustered Wound: No Disorder Photos Wound Measurements Length: (cm) Width: (cm) Depth: (cm) Area: (cm) Volume: (cm) 0 % Reduction in Area: 100% 0 % Reduction in Volume: 100% 0 Epithelialization: None 0 Tunneling: No 0 Undermining: No Wound Description Classification: Grade 1 Wound Margin: Distinct, outline attached Exudate Amount: None Present Foul Odor After Cleansing: No Slough/Fibrino No Wound Bed BLAIKLEY, LEGROW ([id number]) [id number] Page 9 of 9 Granulation Amount: None Present (0%) Exposed Structure Necrotic Amount: None Present (0%) Fascia Exposed: No Fat Layer (Subcutaneous Tissue) Exposed: No Tendon Exposed: No Muscle Exposed: No Joint Exposed: No Bone Exposed: No Periwound Skin Texture Texture Color No Abnormalities Noted: Yes No Abnormalities Noted: Yes Moisture Temperature / Pain No Abnormalities Noted: Yes Temperature: No Abnormality Electronic Signature(s) Signed: 01/29/2023 1:48:17 PM By: [name] Schwalbe RN Entered By: [name] [name] on 01/29/2023  11:45:47 -------------------------------------------------------------------------------- Vitals Details Patient Name: Date of Service: [name] Spruce M. 01/29/2023 10:45 A M Medical Record Number: [id number] Patient Account Number: [id number] Date of Birth/Sex: Treating RN: [name] (89 y.o. F) Primary Care Marcelle Hepner: [name] [name] Other Clinician: Referring Sherle Mello: Treating Jr Milliron/[name] [name]: [name] [name] Weeks in Treatment: 7 Vital Signs Time Taken: 11:26 Temperature (F): 98.6 Height (in): 66 Pulse (bpm): 72 Weight (lbs): 110 Respiratory Rate (breaths/min): 16 Body Mass Index (BMI): 17.8 Blood Pressure (mmHg): 158/76 Reference Range: 80 - 120 mg / dl Electronic Signature(s) Signed: 01/29/2023 1:10:34 PM By: [name] [name] Entered By: [name] [name] on 01/29/2023 11:26:14

## 2023-02-02 DIAGNOSIS — E039 Hypothyroidism, unspecified: Secondary | ICD-10-CM | POA: Diagnosis not present

## 2023-02-02 DIAGNOSIS — E1151 Type 2 diabetes mellitus with diabetic peripheral angiopathy without gangrene: Secondary | ICD-10-CM | POA: Diagnosis not present

## 2023-02-02 DIAGNOSIS — L89896 Pressure-induced deep tissue damage of other site: Secondary | ICD-10-CM | POA: Diagnosis not present

## 2023-02-02 DIAGNOSIS — L97518 Non-pressure chronic ulcer of other part of right foot with other specified severity: Secondary | ICD-10-CM | POA: Diagnosis not present

## 2023-02-02 DIAGNOSIS — N390 Urinary tract infection, site not specified: Secondary | ICD-10-CM | POA: Diagnosis not present

## 2023-02-02 DIAGNOSIS — Z794 Long term (current) use of insulin: Secondary | ICD-10-CM | POA: Diagnosis not present

## 2023-02-02 DIAGNOSIS — A419 Sepsis, unspecified organism: Secondary | ICD-10-CM | POA: Diagnosis not present

## 2023-02-02 DIAGNOSIS — E1159 Type 2 diabetes mellitus with other circulatory complications: Secondary | ICD-10-CM | POA: Diagnosis not present

## 2023-02-02 DIAGNOSIS — I152 Hypertension secondary to endocrine disorders: Secondary | ICD-10-CM | POA: Diagnosis not present

## 2023-02-02 DIAGNOSIS — Z7982 Long term (current) use of aspirin: Secondary | ICD-10-CM | POA: Diagnosis not present

## 2023-02-02 DIAGNOSIS — G9341 Metabolic encephalopathy: Secondary | ICD-10-CM | POA: Diagnosis not present

## 2023-02-02 DIAGNOSIS — D631 Anemia in chronic kidney disease: Secondary | ICD-10-CM | POA: Diagnosis not present

## 2023-02-02 DIAGNOSIS — E11649 Type 2 diabetes mellitus with hypoglycemia without coma: Secondary | ICD-10-CM | POA: Diagnosis not present

## 2023-02-02 DIAGNOSIS — E1122 Type 2 diabetes mellitus with diabetic chronic kidney disease: Secondary | ICD-10-CM | POA: Diagnosis not present

## 2023-02-02 DIAGNOSIS — N182 Chronic kidney disease, stage 2 (mild): Secondary | ICD-10-CM | POA: Diagnosis not present

## 2023-02-02 DIAGNOSIS — G40909 Epilepsy, unspecified, not intractable, without status epilepticus: Secondary | ICD-10-CM | POA: Diagnosis not present

## 2023-02-02 DIAGNOSIS — N179 Acute kidney failure, unspecified: Secondary | ICD-10-CM | POA: Diagnosis not present

## 2023-02-02 DIAGNOSIS — E440 Moderate protein-calorie malnutrition: Secondary | ICD-10-CM | POA: Diagnosis not present

## 2023-02-02 DIAGNOSIS — F0390 Unspecified dementia without behavioral disturbance: Secondary | ICD-10-CM | POA: Diagnosis not present

## 2023-02-02 DIAGNOSIS — L89152 Pressure ulcer of sacral region, stage 2: Secondary | ICD-10-CM | POA: Diagnosis not present

## 2023-02-02 DIAGNOSIS — E11621 Type 2 diabetes mellitus with foot ulcer: Secondary | ICD-10-CM | POA: Diagnosis not present

## 2023-02-02 DIAGNOSIS — L97528 Non-pressure chronic ulcer of other part of left foot with other specified severity: Secondary | ICD-10-CM | POA: Diagnosis not present

## 2023-02-04 DIAGNOSIS — F0390 Unspecified dementia without behavioral disturbance: Secondary | ICD-10-CM | POA: Diagnosis not present

## 2023-02-04 DIAGNOSIS — I152 Hypertension secondary to endocrine disorders: Secondary | ICD-10-CM | POA: Diagnosis not present

## 2023-02-04 DIAGNOSIS — N179 Acute kidney failure, unspecified: Secondary | ICD-10-CM | POA: Diagnosis not present

## 2023-02-04 DIAGNOSIS — E440 Moderate protein-calorie malnutrition: Secondary | ICD-10-CM | POA: Diagnosis not present

## 2023-02-04 DIAGNOSIS — E039 Hypothyroidism, unspecified: Secondary | ICD-10-CM | POA: Diagnosis not present

## 2023-02-04 DIAGNOSIS — L89896 Pressure-induced deep tissue damage of other site: Secondary | ICD-10-CM | POA: Diagnosis not present

## 2023-02-04 DIAGNOSIS — Z794 Long term (current) use of insulin: Secondary | ICD-10-CM | POA: Diagnosis not present

## 2023-02-04 DIAGNOSIS — Z7982 Long term (current) use of aspirin: Secondary | ICD-10-CM | POA: Diagnosis not present

## 2023-02-04 DIAGNOSIS — E11649 Type 2 diabetes mellitus with hypoglycemia without coma: Secondary | ICD-10-CM | POA: Diagnosis not present

## 2023-02-04 DIAGNOSIS — L89152 Pressure ulcer of sacral region, stage 2: Secondary | ICD-10-CM | POA: Diagnosis not present

## 2023-02-04 DIAGNOSIS — N182 Chronic kidney disease, stage 2 (mild): Secondary | ICD-10-CM | POA: Diagnosis not present

## 2023-02-04 DIAGNOSIS — E11621 Type 2 diabetes mellitus with foot ulcer: Secondary | ICD-10-CM | POA: Diagnosis not present

## 2023-02-04 DIAGNOSIS — E1159 Type 2 diabetes mellitus with other circulatory complications: Secondary | ICD-10-CM | POA: Diagnosis not present

## 2023-02-04 DIAGNOSIS — D631 Anemia in chronic kidney disease: Secondary | ICD-10-CM | POA: Diagnosis not present

## 2023-02-04 DIAGNOSIS — G9341 Metabolic encephalopathy: Secondary | ICD-10-CM | POA: Diagnosis not present

## 2023-02-04 DIAGNOSIS — L97518 Non-pressure chronic ulcer of other part of right foot with other specified severity: Secondary | ICD-10-CM | POA: Diagnosis not present

## 2023-02-04 DIAGNOSIS — E1151 Type 2 diabetes mellitus with diabetic peripheral angiopathy without gangrene: Secondary | ICD-10-CM | POA: Diagnosis not present

## 2023-02-04 DIAGNOSIS — L97528 Non-pressure chronic ulcer of other part of left foot with other specified severity: Secondary | ICD-10-CM | POA: Diagnosis not present

## 2023-02-04 DIAGNOSIS — E1122 Type 2 diabetes mellitus with diabetic chronic kidney disease: Secondary | ICD-10-CM | POA: Diagnosis not present

## 2023-02-04 DIAGNOSIS — G40909 Epilepsy, unspecified, not intractable, without status epilepticus: Secondary | ICD-10-CM | POA: Diagnosis not present

## 2023-02-04 DIAGNOSIS — N390 Urinary tract infection, site not specified: Secondary | ICD-10-CM | POA: Diagnosis not present

## 2023-02-04 DIAGNOSIS — A419 Sepsis, unspecified organism: Secondary | ICD-10-CM | POA: Diagnosis not present

## 2023-02-05 DIAGNOSIS — I152 Hypertension secondary to endocrine disorders: Secondary | ICD-10-CM | POA: Diagnosis not present

## 2023-02-05 DIAGNOSIS — L97518 Non-pressure chronic ulcer of other part of right foot with other specified severity: Secondary | ICD-10-CM | POA: Diagnosis not present

## 2023-02-05 DIAGNOSIS — D631 Anemia in chronic kidney disease: Secondary | ICD-10-CM | POA: Diagnosis not present

## 2023-02-05 DIAGNOSIS — F0390 Unspecified dementia without behavioral disturbance: Secondary | ICD-10-CM | POA: Diagnosis not present

## 2023-02-05 DIAGNOSIS — E1122 Type 2 diabetes mellitus with diabetic chronic kidney disease: Secondary | ICD-10-CM | POA: Diagnosis not present

## 2023-02-05 DIAGNOSIS — G9341 Metabolic encephalopathy: Secondary | ICD-10-CM | POA: Diagnosis not present

## 2023-02-05 DIAGNOSIS — L97528 Non-pressure chronic ulcer of other part of left foot with other specified severity: Secondary | ICD-10-CM | POA: Diagnosis not present

## 2023-02-05 DIAGNOSIS — N182 Chronic kidney disease, stage 2 (mild): Secondary | ICD-10-CM | POA: Diagnosis not present

## 2023-02-05 DIAGNOSIS — Z794 Long term (current) use of insulin: Secondary | ICD-10-CM | POA: Diagnosis not present

## 2023-02-05 DIAGNOSIS — E11621 Type 2 diabetes mellitus with foot ulcer: Secondary | ICD-10-CM | POA: Diagnosis not present

## 2023-02-05 DIAGNOSIS — N179 Acute kidney failure, unspecified: Secondary | ICD-10-CM | POA: Diagnosis not present

## 2023-02-05 DIAGNOSIS — L89152 Pressure ulcer of sacral region, stage 2: Secondary | ICD-10-CM | POA: Diagnosis not present

## 2023-02-05 DIAGNOSIS — G40909 Epilepsy, unspecified, not intractable, without status epilepticus: Secondary | ICD-10-CM | POA: Diagnosis not present

## 2023-02-05 DIAGNOSIS — E039 Hypothyroidism, unspecified: Secondary | ICD-10-CM | POA: Diagnosis not present

## 2023-02-05 DIAGNOSIS — E440 Moderate protein-calorie malnutrition: Secondary | ICD-10-CM | POA: Diagnosis not present

## 2023-02-05 DIAGNOSIS — E1159 Type 2 diabetes mellitus with other circulatory complications: Secondary | ICD-10-CM | POA: Diagnosis not present

## 2023-02-05 DIAGNOSIS — Z7982 Long term (current) use of aspirin: Secondary | ICD-10-CM | POA: Diagnosis not present

## 2023-02-05 DIAGNOSIS — A419 Sepsis, unspecified organism: Secondary | ICD-10-CM | POA: Diagnosis not present

## 2023-02-05 DIAGNOSIS — E11649 Type 2 diabetes mellitus with hypoglycemia without coma: Secondary | ICD-10-CM | POA: Diagnosis not present

## 2023-02-05 DIAGNOSIS — N390 Urinary tract infection, site not specified: Secondary | ICD-10-CM | POA: Diagnosis not present

## 2023-02-05 DIAGNOSIS — E1151 Type 2 diabetes mellitus with diabetic peripheral angiopathy without gangrene: Secondary | ICD-10-CM | POA: Diagnosis not present

## 2023-02-05 DIAGNOSIS — L89896 Pressure-induced deep tissue damage of other site: Secondary | ICD-10-CM | POA: Diagnosis not present

## 2023-02-08 ENCOUNTER — Encounter (HOSPITAL_COMMUNITY)
Admission: RE | Disposition: A | Discharge status: Home / Self Care | Payer: Self-pay | Source: Home / Self Care | Attending: Vascular Surgery

## 2023-02-08 ENCOUNTER — Other Ambulatory Visit: Payer: Self-pay

## 2023-02-08 ENCOUNTER — Ambulatory Visit: Payer: Medicare HMO

## 2023-02-08 ENCOUNTER — Observation Stay (HOSPITAL_COMMUNITY)
Admission: RE | Admit: 2023-02-08 | Discharge: 2023-02-09 | Disposition: A | Discharge status: Home / Self Care | Payer: Medicare HMO | Attending: Vascular Surgery | Admitting: Vascular Surgery

## 2023-02-08 DIAGNOSIS — Z794 Long term (current) use of insulin: Secondary | ICD-10-CM | POA: Insufficient documentation

## 2023-02-08 DIAGNOSIS — E039 Hypothyroidism, unspecified: Secondary | ICD-10-CM | POA: Insufficient documentation

## 2023-02-08 DIAGNOSIS — I129 Hypertensive chronic kidney disease with stage 1 through stage 4 chronic kidney disease, or unspecified chronic kidney disease: Secondary | ICD-10-CM | POA: Insufficient documentation

## 2023-02-08 DIAGNOSIS — N189 Chronic kidney disease, unspecified: Secondary | ICD-10-CM | POA: Diagnosis not present

## 2023-02-08 DIAGNOSIS — I70245 Atherosclerosis of native arteries of left leg with ulceration of other part of foot: Secondary | ICD-10-CM | POA: Diagnosis not present

## 2023-02-08 DIAGNOSIS — I739 Peripheral vascular disease, unspecified: Secondary | ICD-10-CM | POA: Diagnosis present

## 2023-02-08 DIAGNOSIS — Z87891 Personal history of nicotine dependence: Secondary | ICD-10-CM | POA: Diagnosis not present

## 2023-02-08 DIAGNOSIS — R2689 Other abnormalities of gait and mobility: Secondary | ICD-10-CM | POA: Insufficient documentation

## 2023-02-08 DIAGNOSIS — Z79899 Other long term (current) drug therapy: Secondary | ICD-10-CM | POA: Diagnosis not present

## 2023-02-08 DIAGNOSIS — I7025 Atherosclerosis of native arteries of other extremities with ulceration: Secondary | ICD-10-CM

## 2023-02-08 DIAGNOSIS — E1122 Type 2 diabetes mellitus with diabetic chronic kidney disease: Secondary | ICD-10-CM | POA: Diagnosis not present

## 2023-02-08 DIAGNOSIS — M6281 Muscle weakness (generalized): Secondary | ICD-10-CM | POA: Diagnosis not present

## 2023-02-08 HISTORY — PX: PERIPHERAL VASCULAR INTERVENTION: CATH118257

## 2023-02-08 HISTORY — PX: ABDOMINAL AORTOGRAM W/LOWER EXTREMITY: CATH118223

## 2023-02-08 LAB — POCT I-STAT, CHEM 8
BUN: 17 mg/dL (ref 8–23)
Calcium, Ion: 1.31 mmol/L (ref 1.15–1.40)
Chloride: 93 mmol/L — ABNORMAL LOW (ref 98–111)
Creatinine, Ser: 1 mg/dL (ref 0.44–1.00)
Glucose, Bld: 248 mg/dL — ABNORMAL HIGH (ref 70–99)
HCT: 32 % — ABNORMAL LOW (ref 36.0–46.0)
Hemoglobin: 10.9 g/dL — ABNORMAL LOW (ref 12.0–15.0)
Potassium: 4.1 mmol/L (ref 3.5–5.1)
Sodium: 129 mmol/L — ABNORMAL LOW (ref 135–145)
TCO2: 33 mmol/L — ABNORMAL HIGH (ref 22–32)

## 2023-02-08 LAB — POCT ACTIVATED CLOTTING TIME
Activated Clotting Time: 238 seconds
Activated Clotting Time: 238 seconds
Activated Clotting Time: 214 seconds
Activated Clotting Time: 171 seconds
Activated Clotting Time: 183 seconds

## 2023-02-08 LAB — GLUCOSE, CAPILLARY: Glucose-Capillary: 269 mg/dL — ABNORMAL HIGH (ref 70–99)

## 2023-02-08 SURGERY — ABDOMINAL AORTOGRAM W/LOWER EXTREMITY
Anesthesia: LOCAL

## 2023-02-08 MED ORDER — CLOPIDOGREL BISULFATE 300 MG PO TABS
ORAL_TABLET | ORAL | Status: AC
  Filled 2023-02-08: qty 1

## 2023-02-08 MED ORDER — HYDRALAZINE HCL 20 MG/ML IJ SOLN
INTRAMUSCULAR | Status: AC
  Filled 2023-02-08: qty 1

## 2023-02-08 MED ORDER — LEVETIRACETAM 500 MG PO TABS
500.0000 mg | ORAL_TABLET | Freq: Two times a day (BID) | ORAL | Status: DC
  Administered 2023-02-08 – 2023-02-09 (×2): 500 mg via ORAL
  Filled 2023-02-08 (×2): qty 1

## 2023-02-08 MED ORDER — IODIXANOL 320 MG/ML IV SOLN
INTRAVENOUS | Status: DC | PRN
  Administered 2023-02-08: 110 mL via INTRA_ARTERIAL

## 2023-02-08 MED ORDER — INSULIN ASPART 100 UNIT/ML IJ SOLN: 0.0000 [IU] | Freq: Every day | INTRAMUSCULAR | Status: DC

## 2023-02-08 MED ORDER — LIDOCAINE HCL (PF) 1 % IJ SOLN
INTRAMUSCULAR | Status: DC | PRN
  Administered 2023-02-08: 15 mL via INTRADERMAL
  Administered 2023-02-08: 20 mL via INTRADERMAL
  Administered 2023-02-08: 15 mL via INTRADERMAL

## 2023-02-08 MED ORDER — INSULIN ASPART 100 UNIT/ML IJ SOLN
0.0000 [IU] | Freq: Three times a day (TID) | INTRAMUSCULAR | Status: DC
  Administered 2023-02-09: 5 [IU] via SUBCUTANEOUS

## 2023-02-08 MED ORDER — LEVOTHYROXINE SODIUM 100 MCG PO TABS
100.0000 ug | ORAL_TABLET | Freq: Every day | ORAL | Status: DC
  Administered 2023-02-09: 100 ug via ORAL
  Filled 2023-02-08: qty 1

## 2023-02-08 MED ORDER — HEPARIN SODIUM (PORCINE) 1000 UNIT/ML IJ SOLN
INTRAMUSCULAR | Status: DC | PRN
  Administered 2023-02-08: 6000 [IU] via INTRAVENOUS
  Administered 2023-02-08: 2000 [IU] via INTRAVENOUS

## 2023-02-08 MED ORDER — SODIUM CHLORIDE 0.9 % IV SOLN
INTRAVENOUS | Status: AC | PRN
  Administered 2023-02-08: 500 mL via INTRAVENOUS

## 2023-02-08 MED ORDER — ACETAMINOPHEN 325 MG PO TABS: 650.0000 mg | ORAL_TABLET | ORAL | Status: DC | PRN

## 2023-02-08 MED ORDER — LORATADINE 10 MG PO TABS
10.0000 mg | ORAL_TABLET | Freq: Every day | ORAL | Status: AC
  Administered 2023-02-08 – 2023-02-09 (×2): 10 mg via ORAL
  Filled 2023-02-08 (×2): qty 1

## 2023-02-08 MED ORDER — HEPARIN SODIUM (PORCINE) 1000 UNIT/ML IJ SOLN
INTRAMUSCULAR | Status: AC
  Filled 2023-02-08: qty 10

## 2023-02-08 MED ORDER — SODIUM CHLORIDE 0.9% FLUSH
3.0000 mL | Freq: Two times a day (BID) | INTRAVENOUS | Status: DC
  Administered 2023-02-09: 3 mL via INTRAVENOUS

## 2023-02-08 MED ORDER — CLOPIDOGREL BISULFATE 75 MG PO TABS
75.0000 mg | ORAL_TABLET | Freq: Every day | ORAL | Status: DC
  Administered 2023-02-09: 75 mg via ORAL
  Filled 2023-02-08: qty 1

## 2023-02-08 MED ORDER — SODIUM CHLORIDE 0.9 % IV SOLN: 250.0000 mL | INTRAVENOUS | Status: DC | PRN

## 2023-02-08 MED ORDER — LABETALOL HCL 5 MG/ML IV SOLN: 10.0000 mg | INTRAVENOUS | Status: DC | PRN

## 2023-02-08 MED ORDER — LIDOCAINE HCL (PF) 1 % IJ SOLN
INTRAMUSCULAR | Status: AC
  Filled 2023-02-08: qty 30

## 2023-02-08 MED ORDER — HYDRALAZINE HCL 20 MG/ML IJ SOLN
INTRAMUSCULAR | Status: DC | PRN
  Administered 2023-02-08: 10 mg via INTRAVENOUS

## 2023-02-08 MED ORDER — FOLIC ACID 1 MG PO TABS
1.0000 mg | ORAL_TABLET | Freq: Every day | ORAL | Status: DC
  Administered 2023-02-08 – 2023-02-09 (×2): 1 mg via ORAL
  Filled 2023-02-08 (×2): qty 1

## 2023-02-08 MED ORDER — SODIUM CHLORIDE 0.9% FLUSH: 3.0000 mL | INTRAVENOUS | Status: DC | PRN

## 2023-02-08 MED ORDER — CLOPIDOGREL BISULFATE 300 MG PO TABS
ORAL_TABLET | ORAL | Status: DC | PRN
  Administered 2023-02-08: 300 mg via ORAL

## 2023-02-08 MED ORDER — ONDANSETRON HCL 4 MG/2ML IJ SOLN
INTRAMUSCULAR | Status: AC
  Filled 2023-02-08: qty 2

## 2023-02-08 MED ORDER — INSULIN DETEMIR 100 UNIT/ML ~~LOC~~ SOLN
3.0000 [IU] | Freq: Every day | SUBCUTANEOUS | Status: DC
  Administered 2023-02-08: 3 [IU] via SUBCUTANEOUS
  Filled 2023-02-08 (×2): qty 0.03

## 2023-02-08 MED ORDER — PREDNISONE 5 MG PO TABS
5.0000 mg | ORAL_TABLET | Freq: Every day | ORAL | Status: DC
  Administered 2023-02-09: 5 mg via ORAL
  Filled 2023-02-08: qty 1

## 2023-02-08 MED ORDER — HEPARIN (PORCINE) IN NACL 1000-0.9 UT/500ML-% IV SOLN
INTRAVENOUS | Status: DC | PRN
  Administered 2023-02-08 (×2): 500 mL

## 2023-02-08 MED ORDER — SODIUM CHLORIDE 0.9 % IV SOLN: INTRAVENOUS | Status: DC

## 2023-02-08 MED ORDER — HYDRALAZINE HCL 20 MG/ML IJ SOLN: 5.0000 mg | INTRAMUSCULAR | Status: DC | PRN

## 2023-02-08 MED ORDER — SODIUM CHLORIDE 0.9 % WEIGHT BASED INFUSION
1.0000 mL/kg/h | INTRAVENOUS | Status: AC
  Administered 2023-02-08: 1 mL/kg/h via INTRAVENOUS

## 2023-02-08 MED ORDER — ONDANSETRON HCL 4 MG/2ML IJ SOLN: 4.0000 mg | Freq: Four times a day (QID) | INTRAMUSCULAR | Status: DC | PRN

## 2023-02-08 MED ORDER — INSULIN DETEMIR 100 UNIT/ML ~~LOC~~ SOLN
7.0000 [IU] | Freq: Every day | SUBCUTANEOUS | Status: DC
  Administered 2023-02-09: 7 [IU] via SUBCUTANEOUS
  Filled 2023-02-08: qty 0.07

## 2023-02-08 MED ORDER — ROSUVASTATIN CALCIUM 5 MG PO TABS
10.0000 mg | ORAL_TABLET | Freq: Every day | ORAL | Status: DC
  Administered 2023-02-08: 10 mg via ORAL
  Filled 2023-02-08: qty 2

## 2023-02-08 MED ORDER — ONDANSETRON HCL 4 MG/2ML IJ SOLN
INTRAMUSCULAR | Status: AC
  Administered 2023-02-08: 4 mg via INTRAMUSCULAR
  Filled 2023-02-08: qty 2

## 2023-02-08 MED ORDER — ONDANSETRON HCL 4 MG/2ML IJ SOLN
INTRAMUSCULAR | Status: DC | PRN
  Administered 2023-02-08: 4 mg via INTRAVENOUS

## 2023-02-08 SURGICAL SUPPLY — 25 items
BAG SNAP BAND KOVER 36X36 (MISCELLANEOUS) IMPLANT
BALLN MUSTANG 9X20X75 (BALLOONS) ×4
BALLOON MUSTANG 9X20X75 (BALLOONS) IMPLANT
CATH ANGIO 5F BER2 65CM (CATHETERS) IMPLANT
CATH ANGIO 5F PIGTAIL 65CM (CATHETERS) IMPLANT
COVER DOME SNAP 22 D (MISCELLANEOUS) IMPLANT
GUIDEWIRE ANGLED .035X150CM (WIRE) IMPLANT
KIT ENCORE 26 ADVANTAGE (KITS) IMPLANT
KIT MICROPUNCTURE NIT STIFF (SHEATH) IMPLANT
KIT PV (KITS) ×2 IMPLANT
PROTECTION STATION PRESSURIZED (MISCELLANEOUS) ×2
SET ATX-X65L (MISCELLANEOUS) IMPLANT
SHEATH BRITE TIP 7FR 35CM (SHEATH) IMPLANT
SHEATH PINNACLE 5F 10CM (SHEATH) IMPLANT
SHEATH PROBE COVER 6X72 (BAG) IMPLANT
STATION PROTECTION PRESSURIZED (MISCELLANEOUS) IMPLANT
STENT VIABAHN 29XCATH 80 (Permanent Stent) IMPLANT
STENT VIABAHN 39XCATH 80 (Permanent Stent) IMPLANT
STENT VIABAHN 8X29 7FR 80 (Permanent Stent) ×2 IMPLANT
STENT VIABAHN 8X39 7FR 80 (Permanent Stent) ×2 IMPLANT
SYR MEDRAD MARK 7 150ML (SYRINGE) ×2 IMPLANT
TRANSDUCER W/STOPCOCK (MISCELLANEOUS) ×2 IMPLANT
TRAY PV CATH (CUSTOM PROCEDURE TRAY) ×2 IMPLANT
WIRE BENTSON .035X145CM (WIRE) IMPLANT
WIRE ROSEN-J .035X180CM (WIRE) IMPLANT

## 2023-02-08 NOTE — Progress Notes (Signed)
Pt arrived to unit from cath lab  VSS, A/O x 4,  CCMD called ,CHG given, pt oriented to unit,Will continue to monitor. Cv   [name] Christmas Korynne Dols, RN   02/08/23 1730  Vitals  Temp 98 F (36.7 C)  Temp Source Oral  BP 134/65  MAP (mmHg) 84  BP Location Right Arm  BP Method Automatic  Patient Position (if appropriate) Lying  Pulse Rate 70  Pulse Rate Source Monitor  ECG Heart Rate 73  Resp 13  Level of Consciousness  Level of Consciousness Alert  Oxygen Therapy  SpO2 100 %  O2 Device Room Air  O2 Flow Rate (L/min) 0 L/min  Pain Assessment  Pain Scale 0-10  Pain Score 0  MEWS Score  MEWS Temp 0  MEWS Systolic 0  MEWS Pulse 0  MEWS RR 1  MEWS LOC 0  MEWS Score 1  MEWS Score Color Green

## 2023-02-08 NOTE — Interval H&P Note (Signed)
History and Physical Interval Note:  02/08/2023 9:56 AM  [name] [name]  has presented today for surgery, with the diagnosis of Atherosclerosis of native arteries of the extremities with ulceration.  The various methods of treatment have been discussed with the patient and family. After consideration of risks, benefits and other options for treatment, the patient has consented to  Procedure(s): ABDOMINAL AORTOGRAM W/LOWER EXTREMITY (N/A) as a surgical intervention.  The patient's history has been reviewed, patient examined, no change in status, stable for surgery.  I have reviewed the patient's chart and labs.  Questions were answered to the patient's satisfaction.     [name]

## 2023-02-08 NOTE — Interval H&P Note (Signed)
History and Physical Interval Note:  02/08/2023 8:50 AM  [name] [name]  has presented today for surgery, with the diagnosis of Atherosclerosis of native arteries of the extremities with ulceration.  The various methods of treatment have been discussed with the patient and family. After consideration of risks, benefits and other options for treatment, the patient has consented to  Procedure(s): ABDOMINAL AORTOGRAM W/LOWER EXTREMITY (N/A) as a surgical intervention.  The patient's history has been reviewed, patient examined, no change in status, stable for surgery.  I have reviewed the patient's chart and labs.  Questions were answered to the patient's satisfaction.     Lemar Livings

## 2023-02-08 NOTE — Progress Notes (Signed)
1550 7Fr sheath removed from rt. Groin after aspiration.  Manual pressure held for 30 minutes.  Drsg applied.  No hematoma noted. Act prior to sheath pull 171. Positive pulses with doppler on both feet.  1555 66fr sheath pulled form lft. Groin.  Manual pressure held for 30 minutes   drsg applied no hematoma noted.  Bedrest started at 1630

## 2023-02-08 NOTE — Plan of Care (Signed)

## 2023-02-08 NOTE — Progress Notes (Signed)
Report and care received from [facility] Patient resting in bed at this time. 625F sheath in Rt Femoral Artery and 625F sheath in Lt Femoral Artery. Sites soft with no active bleeding or hemtoma noted. VSS. Will continue to monitor patient.

## 2023-02-08 NOTE — Progress Notes (Addendum)
During sheath removal hold, patient dropped her HR fro 80bpm to low 40's.  Patient not responsive for about 20 seconds. Airway positioned, patients HR quickly returned to 80's before saline or atropine were needed.  Patient was nauseated and had some clear emesis , 4mg  zofran given iv.  Dr. Edilia Bo notified.  Patient feels better, no nausea.HR 76, BP 135/69 SPO2 94

## 2023-02-08 NOTE — Progress Notes (Signed)
Bilateral groin sites intact; dressings (bilateral) clean, dry, intact. Bilateral DP with doppler. Call bell at r ; siderails up X2 bed in lowest position; monitoring

## 2023-02-08 NOTE — Op Note (Signed)
PATIENT: [name] [name]      MRN: [id number] DOB: Sep 24, 1933    DATE OF PROCEDURE: 02/08/2023  INDICATIONS:    [name] [name] is a 89 y.o. female who presents with a wound on her left great toe.  She presents for arteriography and possible intervention.  PROCEDURE:    Ultrasound-guided access to the right common femoral artery Aortogram with bilateral iliac arteriogram Ultrasound-guided access in the left common femoral artery Kissing balloon angioplasty of bilateral common iliac artery stenoses (right common iliac artery 8 x 29 VBX stent, left common iliac 8 x 39 VBX stent, postdilatation with 9 mm x 2 cm balloon Bilateral lower extremity runoff  SURGEON: Di Kindle. Edilia Bo, MD, FACS  ANESTHESIA: Local  EBL: Minimal  TECHNIQUE:   Both groins were prepped and draped in the usual sterile fashion.  Under ultrasound guidance, after the skin was anesthetized, I cannulated the right common femoral artery with a micropuncture needle and a micropuncture sheath was introduced over a wire.  This was exchanged for a 5 [place] sheath over a Bentson wire.  By ultrasound the femoral artery was patent. A real-time image was obtained and sent to the server.  The pigtail catheter was positioned at the L1 vertebral body and flush aortogram obtained.  Next the catheter was positioned above the bifurcation and an oblique iliac projection was obtained.  Next bilateral lower extremity runoff films were obtained.  Given that the patient had a slightly diminished left femoral pulse and given the bulky calcific plaque in the common iliac artery elected to address this given that this patient had limb threatening ischemia.  Under ultrasound guidance, after the skin was anesthetized, the left common femoral artery was cannulated with a micropuncture needle and a micropuncture sheath introduced over a wire.  I was then unable to get a Bentson wire through the area of disease but using a Berenstein catheter and  angled Glidewire I was ultimately able to get through the disease in the common iliac artery.  A 5 French sheath was placed on the left.  Patient was heparinized and ACT was monitored throughout the procedure.  The patient had a bulky plaque that extended up at the bifurcation and therefore I felt kissing balloons was indicated.  7 French sheaths were placed on both sides.  On the right side and 8 mm x 29 mm VBX stent was selected and was positioned across the stenosis extending slightly up into the aorta.  On the left side an 8 mm x 39 mm VBX stent was positioned to match the right common iliac stent at the top.  These were deployed.  Completion film showed that they were would not fully in apposition and therefore I went back with a 9 mm x 2 cm balloon and further dilated both stents.  Although the stents were still slightly undersized given her age I felt it would be safe to stop at this point rather than risk dissection or injury to the aorta or iliac arteries.  The stents were both widely patent with no significant inflow disease.  I did shoot a distal subtraction shot of the left foot to determine if the patient would be a candidate for retrograde intervention if indicated.  Patient was transferred to the holding area for removal of the sheath.  No immediate complications were noted.  FINDINGS:   Single arteries bilaterally with no significant renal artery stenosis identified.  Single renal arteries bilaterally with no significant renal artery stenosis identified. Plaque  extending into the aortic bifurcation with moderate plaque in the right common iliac artery and a bulky calcific plaque in the left common iliac artery..  These were addressed with kissing balloon angioplasty and stenting as described above. On the left side, there is moderate disease throughout the superficial femoral artery and then a tight stenosis at the in the popliteal artery at the level of the knee.  The proximal tibial  vessels are all occluded.  There is reconstitution of a small posterior tibial artery on the left a small peroneal and small anterior tibial artery and dorsalis pedis artery. On the right side the common femoral, deep femoral, and superficial femoral artery are patent.  The tibial vessels are all occluded proximally with reconstitution of the anterior tibial artery.  The posterior tibial artery reconstitutes but occludes above the ankle.  The peroneal artery is occluded.  CLINICAL NOTE: Her inflow disease on the left has been addressed.  If she continues to have problems with her foot the only remaining option would be a retrograde intervention on the left although these arteries are quite small.  I will review the films with Dr. Randie Heinz.  [name], MD, FACS [facility]  DATE OF DICTATION:   02/08/2023

## 2023-02-09 ENCOUNTER — Encounter (HOSPITAL_COMMUNITY): Payer: Self-pay | Admitting: Vascular Surgery

## 2023-02-09 DIAGNOSIS — M6281 Muscle weakness (generalized): Secondary | ICD-10-CM | POA: Diagnosis not present

## 2023-02-09 DIAGNOSIS — N189 Chronic kidney disease, unspecified: Secondary | ICD-10-CM | POA: Diagnosis not present

## 2023-02-09 DIAGNOSIS — I739 Peripheral vascular disease, unspecified: Secondary | ICD-10-CM | POA: Diagnosis not present

## 2023-02-09 DIAGNOSIS — R2689 Other abnormalities of gait and mobility: Secondary | ICD-10-CM | POA: Diagnosis not present

## 2023-02-09 DIAGNOSIS — Z794 Long term (current) use of insulin: Secondary | ICD-10-CM | POA: Diagnosis not present

## 2023-02-09 DIAGNOSIS — E039 Hypothyroidism, unspecified: Secondary | ICD-10-CM | POA: Diagnosis not present

## 2023-02-09 DIAGNOSIS — Z79899 Other long term (current) drug therapy: Secondary | ICD-10-CM | POA: Diagnosis not present

## 2023-02-09 DIAGNOSIS — E1122 Type 2 diabetes mellitus with diabetic chronic kidney disease: Secondary | ICD-10-CM | POA: Diagnosis not present

## 2023-02-09 DIAGNOSIS — I129 Hypertensive chronic kidney disease with stage 1 through stage 4 chronic kidney disease, or unspecified chronic kidney disease: Secondary | ICD-10-CM | POA: Diagnosis not present

## 2023-02-09 DIAGNOSIS — Z87891 Personal history of nicotine dependence: Secondary | ICD-10-CM | POA: Diagnosis not present

## 2023-02-09 LAB — LIPID PANEL
Cholesterol: 306 mg/dL — ABNORMAL HIGH (ref 0–200)
HDL: 95 mg/dL (ref >40)
LDL Cholesterol: 201 mg/dL — ABNORMAL HIGH (ref 0–99)
Total CHOL/HDL Ratio: 3.2 RATIO
Triglycerides: 51 mg/dL (ref <150)
VLDL: 10 mg/dL (ref 0–40)

## 2023-02-09 LAB — BASIC METABOLIC PANEL
Anion gap: 6 (ref 5–15)
BUN: 16 mg/dL (ref 8–23)
CO2: 26 mmol/L (ref 22–32)
Calcium: 8.9 mg/dL (ref 8.9–10.3)
Chloride: 96 mmol/L — ABNORMAL LOW (ref 98–111)
Creatinine, Ser: 0.82 mg/dL (ref 0.44–1.00)
GFR, Estimated: >60 mL/min (ref >60)
Glucose, Bld: 315 mg/dL — ABNORMAL HIGH (ref 70–99)
Potassium: 4.3 mmol/L (ref 3.5–5.1)
Sodium: 128 mmol/L — ABNORMAL LOW (ref 135–145)

## 2023-02-09 LAB — GLUCOSE, CAPILLARY
Glucose-Capillary: 235 mg/dL — ABNORMAL HIGH (ref 70–99)
Glucose-Capillary: 222 mg/dL — ABNORMAL HIGH (ref 70–99)

## 2023-02-09 MED ORDER — EZETIMIBE 10 MG PO TABS: 10.0000 mg | ORAL_TABLET | Freq: Every day | ORAL | 11 refills | Status: DC

## 2023-02-09 MED ORDER — ROSUVASTATIN CALCIUM 10 MG PO TABS: 10.0000 mg | ORAL_TABLET | Freq: Every day | ORAL | 11 refills | Status: DC

## 2023-02-09 MED ORDER — CLOPIDOGREL BISULFATE 75 MG PO TABS: 75.0000 mg | ORAL_TABLET | Freq: Every day | ORAL | 2 refills | Status: DC

## 2023-02-09 NOTE — Progress Notes (Signed)
  VASCULAR SURGERY ASSESSMENT & PLAN:   POD 1 KISSING COMMON ILIAC ARTERY ANGIOPLASTY AND STENTING: The patient has good femoral pulses and fairly brisk Doppler signals in both feet.  The wounds on the toes are unchanged.  Ready for discharge today.  I believe her wounds are followed in the wound care center.  I can see her back in 6 weeks and check on her ABIs at that time.  I have arranged follow-up.  SUBJECTIVE:   No complaints this morning.  PHYSICAL EXAM:   Vitals:   02/08/23 1900 02/08/23 2000 02/08/23 2300 02/09/23 0255  BP: (!) 140/63 (!) 144/67 (!) 145/73 (!) 140/67  Pulse: 69 71 69 80  Resp: 16 18 11 15   Temp: 98.1 F (36.7 C)  98 F (36.7 C) 98.3 F (36.8 C)  TempSrc: Oral  Oral Oral  SpO2: 100% 100% 100% 100%  Weight:      Height:       Normal femoral pulses. Both groin sites look fine. Brisk Doppler signals in both feet.  LABS:   Lab Results  Component Value Date   WBC 13.5 (H) 01/04/2023   HGB 10.9 (L) 02/08/2023   HCT 32.0 (L) 02/08/2023   MCV 98.4 01/04/2023   PLT 303 01/04/2023   Lab Results  Component Value Date   CREATININE 0.82 02/09/2023   Lab Results  Component Value Date   INR 1.1 09/10/2022   CBG (last 3)  Recent Labs    02/08/23 1748 02/08/23 2008 02/09/23 0543  GLUCAP 269* 235* 222*    PROBLEM LIST:    Principal Problem:   PAD (peripheral artery disease) (HCC)   CURRENT MEDS:    clopidogrel  75 mg Oral Q breakfast   folic acid  1 mg Oral Daily   insulin aspart  0-15 Units Subcutaneous TID WC   insulin aspart  0-5 Units Subcutaneous QHS   insulin detemir  3 Units Subcutaneous QHS   insulin detemir  7 Units Subcutaneous Daily   levETIRAcetam  500 mg Oral BID   levothyroxine  100 mcg Oral Q0600   loratadine  10 mg Oral Daily   predniSONE  5 mg Oral Q breakfast   rosuvastatin  10 mg Oral Daily   sodium chloride flush  3 mL Intravenous Q12H    [name] Office: [phone] 02/09/2023

## 2023-02-09 NOTE — Progress Notes (Signed)
PHARMACIST LIPID MONITORING [name] [name] is a 89 y.o. female admitted on 02/08/2023 with PVD.  Pharmacy has been consulted to optimize lipid-lowering therapy with the indication of secondary prevention for clinical ASCVD.  Recent Labs:  Lipid Panel (last 6 months):   Lab Results  Component Value Date   CHOL 306 (H) 02/09/2023   TRIG 51 02/09/2023   HDL 95 02/09/2023   CHOLHDL 3.2 02/09/2023   VLDL 10 02/09/2023   LDLCALC 201 (H) 02/09/2023    Hepatic function panel (last 6 months):   Lab Results  Component Value Date   AST 33 01/04/2023   ALT 27 01/04/2023   ALKPHOS 75 01/04/2023   BILITOT 0.6 01/04/2023    SCr (since admission):   Serum creatinine: 0.82 mg/dL 86/57/84 6962 Estimated creatinine clearance: 42.5 mL/min  Current therapy and lipid therapy tolerance Current lipid-lowering therapy: none Previous lipid-lowering therapies (if applicable): atorvastatin Documented or reported allergies or intolerances to lipid-lowering therapies (if applicable): muscle pain with statins - family does not want patient to be on statin therapy  Assessment:   Patient agrees with changes to lipid-lowering therapy  Plan:    1.Statin intensity (high intensity recommended for all patients regardless of the LDL):  Statin intolerance noted. No statin changes due to serious side effects (ex. Myalgias with at least 2 different statins).  2.Add ezetimibe (if any one of the following):   Cannot tolerate statin at any dose.  3.Refer to lipid clinic:   No  4.Follow-up with:  Primary care provider - [name], MD  5.Follow-up labs after discharge:  Changes in lipid therapy were made. Check a lipid panel in 8-12 weeks then annually.     Jenita Seashore, PharmD 02/09/2023, 10:16 AM

## 2023-02-09 NOTE — Evaluation (Signed)
Occupational Therapy Evaluation Patient Details Name: [name] [name] MRN: [id number] DOB: Sep 16, 1933 Today's Date: 02/09/2023   History of Present Illness [name] [name] is a 89 y.o. female who presents with a wound on her left great toe.  She presents for arteriography and possible intervention   Clinical Impression   Pt presents at total A baseline level of function with ADLs and selfcare, mod A with SPTs. PTA pt lived at home with her daughter, has another daughter that also assists, [facility] nurse per pt daughter Helmut Muster. All education completed and no further acute OT services are indicated at this time.      Recommendations for follow up therapy are one component of a multi-disciplinary discharge planning process, led by the attending physician.  Recommendations may be updated based on patient status, additional functional criteria and insurance authorization.   Assistance Recommended at Discharge Frequent or constant Supervision/Assistance  Patient can return home with the following A lot of help with bathing/dressing/bathroom;A little help with walking and/or transfers;Direct supervision/assist for medications management;Assistance with cooking/housework;Assist for transportation;Direct supervision/assist for financial management    Functional Status Assessment  Patient has not had a recent decline in their functional status  Equipment Recommendations  None recommended by OT    Recommendations for Other Services       Precautions / Restrictions Precautions Precautions: Fall Restrictions Weight Bearing Restrictions: No      Mobility Bed Mobility Overal bed mobility: Needs Assistance Bed Mobility: Supine to Sit, Sit to Supine     Supine to sit: Min assist, HOB elevated Sit to supine: Min assist   General bed mobility comments: increased time, used rail    Transfers Overall transfer level: Needs assistance   Transfers: Sit to/from Stand, Bed to  chair/wheelchair/BSC Sit to Stand: Mod assist Stand pivot transfers: Mod assist         General transfer comment: at baseline mod A      Balance Overall balance assessment: Needs assistance Sitting-balance support: Bilateral upper extremity supported, No upper extremity supported Sitting balance-Leahy Scale: Fair   Postural control: Posterior lean Standing balance support: Bilateral upper extremity supported Standing balance-Leahy Scale: Poor                             ADL either performed or assessed with clinical judgement   ADL Overall ADL's : At baseline                                       General ADL Comments: at baseline total A, feeds self     Vision Ability to See in Adequate Light: 0 Adequate Patient Visual Report: No change from baseline       Perception     Praxis      Pertinent Vitals/Pain Pain Assessment Pain Assessment: Faces Faces Pain Scale: Hurts a little bit Pain Location: R LE surgical site Pain Descriptors / Indicators: Sore Pain Intervention(s): Monitored during session, Repositioned     Hand Dominance Right   Extremity/Trunk Assessment Upper Extremity Assessment Upper Extremity Assessment: Generalized weakness   Lower Extremity Assessment Lower Extremity Assessment: Defer to PT evaluation       Communication Communication Communication: No difficulties   Cognition Arousal/Alertness: Awake/alert Behavior During Therapy: Flat affect Overall Cognitive Status: History of cognitive impairments - at baseline  General Comments: at baseline per pt's daughter     General Comments       Exercises     Shoulder Instructions      Home Living Family/patient expects to be discharged to:: Private residence Living Arrangements: Children Available Help at Discharge: Family;Available 24 hours/day Type of Home: House Home Access: Stairs to enter [employer] of Steps: 2 Entrance Stairs-Rails: None Home Layout: One level     Bathroom Shower/Tub: Producer, television/film/video: Standard Bathroom Accessibility: Yes   Home Equipment: Agricultural consultant (2 wheels);Shower seat - built in;Wheelchair - manual;BSC/3in1   Additional Comments: Pt is never alone, as she stays with one of her daughter's a couple days out of the week & her other daughter comes to the pt's home and stays the other days.      Prior Functioning/Environment Prior Level of Function : Needs assist             Mobility Comments: Family assists with mobility for safety; uses w/c, SPTs ADLs Comments: total A with ADLs/selfcare, feeds herslef per her daughter        OT Problem List: Impaired balance (sitting and/or standing);Decreased activity tolerance;Decreased cognition      OT Treatment/Interventions:      OT Goals(Current goals can be found in the care plan section) Acute Rehab OT Goals Patient Stated Goal: go home  OT Frequency:      Co-evaluation              AM-PAC OT "6 Clicks" Daily Activity     Outcome Measure Help from another person eating meals?: A Little Help from another person taking care of personal grooming?: Total Help from another person toileting, which includes using toliet, bedpan, or urinal?: Total Help from another person bathing (including washing, rinsing, drying)?: Total Help from another person to put on and taking off regular upper body clothing?: Total Help from another person to put on and taking off regular lower body clothing?: Total 6 Click Score: 8   End of Session Equipment Utilized During Treatment: Gait belt  Activity Tolerance: Patient tolerated treatment well Patient left: in bed;with call bell/phone within reach;with bed alarm set;with family/visitor present  OT Visit Diagnosis: Muscle weakness (generalized) (M62.81);Other abnormalities of gait and mobility (R26.89)                Time:  1610-9604 OT Time Calculation (min): 25 min Charges:  OT General Charges $OT Visit: 1 Visit OT Evaluation $OT Eval Low Complexity: 1 Low OT Treatments $Therapeutic Activity: 8-22 mins    Galen Manila 02/09/2023, 1:11 PM

## 2023-02-09 NOTE — Progress Notes (Signed)
Discharge instructions (including medications) discussed with and copy provided to patient/caregiver 

## 2023-02-09 NOTE — Discharge Instructions (Signed)
  [facility] [place]  Discharge Instructions  Lower Extremity Angiogram; Angioplasty/Stenting  Please refer to the following instructions for your post-procedure care. Your surgeon or physician assistant will discuss any changes with you.  Activity  Avoid lifting more than 8 pounds (1 gallons of milk) for 5 days after your procedure. You may walk as much as you can tolerate. It's OK to drive after 72 hours.  Bathing/Showering  You may shower the day after your procedure. If you have a bandage, you may remove it at 24- 48 hours. Clean your incision site with mild soap and water. Pat the area dry with a clean towel.  Diet  Resume your pre-procedure diet. There are no special food restrictions following this procedure. All patients with peripheral vascular disease should follow a low fat/low cholesterol diet. In order to heal from your surgery, it is CRITICAL to get adequate nutrition. Your body requires vitamins, minerals, and protein. Vegetables are the best source of vitamins and minerals. Vegetables also provide the perfect balance of protein. Processed food has little nutritional value, so try to avoid this.  Medications  Resume taking all of your medications unless your doctor tells you not to. If your incision is causing pain, you may take over-the-counter pain relievers such as acetaminophen (Tylenol)  Follow Up  Follow up will be arranged at the time of your procedure. You may have an office visit scheduled or may be scheduled for surgery. Ask your surgeon if you have any questions.  Please call us immediately for any of the following conditions: .Severe or worsening pain your legs or feet at rest or with walking. .Increased pain, redness, drainage at your groin puncture site. .Fever of 101 degrees or higher. .If you have any mild or slow bleeding from your puncture site: lie down, apply firm constant pressure over the area with a piece of gauze or a  clean wash cloth for 30 minutes- no peeking!, call 911 right away if you are still bleeding after 30 minutes, or if the bleeding is heavy and unmanageable.  Reduce your risk factors of vascular disease:  . Stop smoking. If you would like help call [facility] at 1-800-QUIT-NOW (1-800-784-8669) or [place] at [phone]. . Manage your cholesterol . Maintain a desired weight . Control your diabetes . Keep your blood pressure down .  If you have any questions, please call the office at [phone] 

## 2023-02-09 NOTE — Plan of Care (Signed)
  Problem: Education: Goal: Understanding of CV disease, CV risk reduction, and recovery process will improve Outcome: Adequate for Discharge Goal: Individualized Educational Video(s) Outcome: Adequate for Discharge   Problem: Activity: Goal: Ability to return to baseline activity level will improve Outcome: Adequate for Discharge   Problem: Cardiovascular: Goal: Ability to achieve and maintain adequate cardiovascular perfusion will improve Outcome: Adequate for Discharge Goal: Vascular access site(s) Level 0-1 will be maintained Outcome: Adequate for Discharge   Problem: Health Behavior/Discharge Planning: Goal: Ability to safely manage health-related needs after discharge will improve Outcome: Adequate for Discharge   Problem: Education: Goal: Ability to describe self-care measures that may prevent or decrease complications (Diabetes Survival Skills Education) will improve Outcome: Adequate for Discharge Goal: Individualized Educational Video(s) Outcome: Adequate for Discharge   Problem: Coping: Goal: Ability to adjust to condition or change in health will improve Outcome: Adequate for Discharge   Problem: Fluid Volume: Goal: Ability to maintain a balanced intake and output will improve Outcome: Adequate for Discharge   Problem: Health Behavior/Discharge Planning: Goal: Ability to identify and utilize available resources and services will improve Outcome: Adequate for Discharge Goal: Ability to manage health-related needs will improve Outcome: Adequate for Discharge   Problem: Metabolic: Goal: Ability to maintain appropriate glucose levels will improve Outcome: Adequate for Discharge   Problem: Nutritional: Goal: Maintenance of adequate nutrition will improve Outcome: Adequate for Discharge Goal: Progress toward achieving an optimal weight will improve Outcome: Adequate for Discharge   Problem: Skin Integrity: Goal: Risk for impaired skin integrity will  decrease Outcome: Adequate for Discharge   Problem: Tissue Perfusion: Goal: Adequacy of tissue perfusion will improve Outcome: Adequate for Discharge   Problem: Education: Goal: Knowledge of General Education information will improve Description: Including pain rating scale, medication(s)/side effects and non-pharmacologic comfort measures Outcome: Adequate for Discharge   Problem: Health Behavior/Discharge Planning: Goal: Ability to manage health-related needs will improve Outcome: Adequate for Discharge   Problem: Clinical Measurements: Goal: Ability to maintain clinical measurements within normal limits will improve Outcome: Adequate for Discharge Goal: Will remain free from infection Outcome: Adequate for Discharge Goal: Diagnostic test results will improve Outcome: Adequate for Discharge Goal: Respiratory complications will improve Outcome: Adequate for Discharge Goal: Cardiovascular complication will be avoided Outcome: Adequate for Discharge   Problem: Activity: Goal: Risk for activity intolerance will decrease Outcome: Adequate for Discharge   Problem: Nutrition: Goal: Adequate nutrition will be maintained Outcome: Adequate for Discharge   Problem: Coping: Goal: Level of anxiety will decrease Outcome: Adequate for Discharge   Problem: Elimination: Goal: Will not experience complications related to bowel motility Outcome: Adequate for Discharge Goal: Will not experience complications related to urinary retention Outcome: Adequate for Discharge   Problem: Pain Managment: Goal: General experience of comfort will improve Outcome: Adequate for Discharge   Problem: Safety: Goal: Ability to remain free from injury will improve Outcome: Adequate for Discharge   Problem: Skin Integrity: Goal: Risk for impaired skin integrity will decrease Outcome: Adequate for Discharge   

## 2023-02-10 DIAGNOSIS — N182 Chronic kidney disease, stage 2 (mild): Secondary | ICD-10-CM | POA: Diagnosis not present

## 2023-02-10 DIAGNOSIS — F0390 Unspecified dementia without behavioral disturbance: Secondary | ICD-10-CM | POA: Diagnosis not present

## 2023-02-10 DIAGNOSIS — A419 Sepsis, unspecified organism: Secondary | ICD-10-CM | POA: Diagnosis not present

## 2023-02-10 DIAGNOSIS — E039 Hypothyroidism, unspecified: Secondary | ICD-10-CM | POA: Diagnosis not present

## 2023-02-10 DIAGNOSIS — E440 Moderate protein-calorie malnutrition: Secondary | ICD-10-CM | POA: Diagnosis not present

## 2023-02-10 DIAGNOSIS — N179 Acute kidney failure, unspecified: Secondary | ICD-10-CM | POA: Diagnosis not present

## 2023-02-10 DIAGNOSIS — Z7982 Long term (current) use of aspirin: Secondary | ICD-10-CM | POA: Diagnosis not present

## 2023-02-10 DIAGNOSIS — G40909 Epilepsy, unspecified, not intractable, without status epilepticus: Secondary | ICD-10-CM | POA: Diagnosis not present

## 2023-02-10 DIAGNOSIS — E1151 Type 2 diabetes mellitus with diabetic peripheral angiopathy without gangrene: Secondary | ICD-10-CM | POA: Diagnosis not present

## 2023-02-10 DIAGNOSIS — E1159 Type 2 diabetes mellitus with other circulatory complications: Secondary | ICD-10-CM | POA: Diagnosis not present

## 2023-02-10 DIAGNOSIS — L97518 Non-pressure chronic ulcer of other part of right foot with other specified severity: Secondary | ICD-10-CM | POA: Diagnosis not present

## 2023-02-10 DIAGNOSIS — L89896 Pressure-induced deep tissue damage of other site: Secondary | ICD-10-CM | POA: Diagnosis not present

## 2023-02-10 DIAGNOSIS — E11621 Type 2 diabetes mellitus with foot ulcer: Secondary | ICD-10-CM | POA: Diagnosis not present

## 2023-02-10 DIAGNOSIS — L89152 Pressure ulcer of sacral region, stage 2: Secondary | ICD-10-CM | POA: Diagnosis not present

## 2023-02-10 DIAGNOSIS — L97528 Non-pressure chronic ulcer of other part of left foot with other specified severity: Secondary | ICD-10-CM | POA: Diagnosis not present

## 2023-02-10 DIAGNOSIS — Z794 Long term (current) use of insulin: Secondary | ICD-10-CM | POA: Diagnosis not present

## 2023-02-10 DIAGNOSIS — N390 Urinary tract infection, site not specified: Secondary | ICD-10-CM | POA: Diagnosis not present

## 2023-02-10 DIAGNOSIS — I152 Hypertension secondary to endocrine disorders: Secondary | ICD-10-CM | POA: Diagnosis not present

## 2023-02-10 DIAGNOSIS — G9341 Metabolic encephalopathy: Secondary | ICD-10-CM | POA: Diagnosis not present

## 2023-02-10 DIAGNOSIS — E1122 Type 2 diabetes mellitus with diabetic chronic kidney disease: Secondary | ICD-10-CM | POA: Diagnosis not present

## 2023-02-10 DIAGNOSIS — E11649 Type 2 diabetes mellitus with hypoglycemia without coma: Secondary | ICD-10-CM | POA: Diagnosis not present

## 2023-02-10 DIAGNOSIS — D631 Anemia in chronic kidney disease: Secondary | ICD-10-CM | POA: Diagnosis not present

## 2023-02-10 NOTE — Discharge Summary (Signed)
Discharge Summary  Patient ID: [name] [name] [id number] 89 y.o. May 29, 1934  Admit date: 02/08/2023  Discharge date and time: 02/09/2023 10:36 AM   Admitting Physician: Chuck Hint, MD   Discharge Physician: [name], MD  Admission Diagnoses: PAD (peripheral artery disease) (HCC) [I73.9]  Discharge Diagnoses: PAD (peripheral artery disease) (HCC) [I73.9]  Admission Condition: stable  Discharged Condition: good  Indication for Admission: [name] [name] is a 89 y.o. female who presents with a wound on her left great toe.  She presents for arteriography and possible intervention.   Hospital Course: Ms/ Rodak was admitted on 02/08/23 and underwent  1. Ultrasound-guided access to the right common femoral artery 2. Aortogram with bilateral iliac arteriogram 3. Ultrasound-guided access in the left common femoral artery 4. Kissing balloon angioplasty of bilateral common iliac artery stenoses (right common iliac artery 8 x 29 VBX stent, left common iliac 8 x 39 VBX stent, postdilatation with 9 mm x 2 cm balloon 5. Bilateral lower extremity runoff by Dr. Edilia Bo. She was kept for overnight observation.  She did very well overnight. No common femoral access site issues in the left groin. No swelling or hematoma. Her bilateral lower extremities remained well perfused and warm. Palpable femoral pulses bilaterally with brisk doppler signals in both feet. Her toe wounds are unchanged. She will follow up with the wound care center for management of these in outpatient setting. She otherwise remained stable for discharge home. She comes from home and lives with her daughter who is available for assistance. OT evaluated her and no follow up needed. She will resume all her home medications as prescribed. New prescriptions for Clopidogrel and ezetimibe were sent to her pharmacy. She will have follow up with Dr. Edilia Bo in 6 weeks with ABI.   Consults: None  Treatments: therapies: OT  and surgery:1. Ultrasound-guided access to the right common femoral artery 2. Aortogram with bilateral iliac arteriogram 3. Ultrasound-guided access in the left common femoral artery 4. Kissing balloon angioplasty of bilateral common iliac artery stenoses (right common iliac artery 8 x 29 VBX stent, left common iliac 8 x 39 VBX stent, postdilatation with 9 mm x 2 cm balloon 5. Bilateral lower extremity runoff   Disposition: Discharge disposition: 01-Home or Self Care       Patient Instructions:  Allergies as of 02/09/2023       Reactions   Nsaids Other (See Comments)   No NSAIDS due to kidney function- ESPECIALLY ibuprofen or Aleve   Biaxin [clarithromycin] Hives   Statins Other (See Comments)   Muscle pain (myositis per daugher)   Bactrim [sulfamethoxazole-trimethoprim] Hives, Rash   Lisinopril Cough   Sulfa Antibiotics Hives, Rash, Other (See Comments)   NO sulfa-based meds!!        Medication List     TAKE these medications    acetaminophen 650 MG CR tablet Commonly known as: TYLENOL Take 650 mg by mouth every 8 (eight) hours as needed for pain.   Bayer Low Dose 81 MG tablet Generic drug: aspirin EC Take 81 mg by mouth in the morning. Swallow whole.   cetirizine 10 MG tablet Commonly known as: ZYRTEC Take 10 mg by mouth in the morning, at noon, and at bedtime.   clopidogrel 75 MG tablet Commonly known as: PLAVIX Take 1 tablet (75 mg total) by mouth daily with breakfast.   collagenase 250 UNIT/GM ointment Commonly known as: SANTYL Apply 1 Application topically 2 (two) times a week. Applied to affected areas of toes/feet.  ezetimibe 10 MG tablet Commonly known as: Zetia Take 1 tablet (10 mg total) by mouth daily.   ferrous sulfate 220 (44 Fe) MG/5ML solution Take 330 mg by mouth 3 (three) times a week.   folic acid 1 MG tablet Commonly known as: FOLVITE Take 1 mg by mouth at bedtime.   Levemir FlexTouch 100 UNIT/ML FlexTouch Pen Generic drug: insulin  detemir Inject 3-7 Units into the skin See admin instructions. Inject 7 units into the skin in the morning and 3 units at bedtime   levETIRAcetam 500 MG tablet Commonly known as: KEPPRA Take 500 mg by mouth 2 (two) times daily.   levothyroxine 100 MCG tablet Commonly known as: SYNTHROID Take 1 tablet (100 mcg total) by mouth daily before breakfast.   liver oil-zinc oxide 40 % ointment Commonly known as: DESITIN Apply topically 3 (three) times daily. What changed:  how much to take when to take this reasons to take this   loperamide 2 MG tablet Commonly known as: IMODIUM A-D Take 2 mg by mouth 4 (four) times daily as needed for diarrhea or loose stools.   methotrexate 2.5 MG tablet Commonly known as: RHEUMATREX Take 1 tablet (2.5 mg total) by mouth once a week. Caution:Chemotherapy. Protect from light. What changed:  how much to take when to take this   metoprolol tartrate 25 MG tablet Commonly known as: LOPRESSOR Take 1 tablet (25 mg total) by mouth 2 (two) times daily. What changed:  how much to take when to take this reasons to take this   midodrine 5 MG tablet Commonly known as: PROAMATINE Take 0.5 tablets (2.5 mg total) by mouth 3 (three) times daily with meals. What changed:  when to take this reasons to take this   multivitamin with minerals Tabs tablet Take 1 tablet by mouth in the morning.   NovoLOG FlexPen 100 UNIT/ML FlexPen Generic drug: insulin aspart Inject 2-10 Units into the skin See admin instructions. Inject 2-10 units into the skin three times a day with meals, per sliding scale:  Breakfast: BGL 80-199 = 8 units; 200-299 = 9 units; 300 or greater = 10 units Lunch: BGL 80-199 = 5 units; 200-299 = 6 units; 300 or greater = 7 units Supper/evening meal: BGL 80-199 = 2 units; 200-299 = 3 units; 300 or greater = 4 units   PEPCID AC PO Take 20 mg by mouth at bedtime.   Plaquenil 200 MG tablet Generic drug: hydroxychloroquine Take 200 mg by mouth  at bedtime.   polyethylene glycol 17 g packet Commonly known as: MIRALAX / GLYCOLAX Take 17 g by mouth daily. What changed:  when to take this reasons to take this   predniSONE 5 MG tablet Commonly known as: DELTASONE Take 5 mg by mouth daily with breakfast.   triamcinolone ointment 0.1 % Commonly known as: KENALOG Apply 1 Application topically 2 (two) times daily.   VITAMIN D-3 PO Take 2,000 Units by mouth daily.       Activity: activity as tolerated, activity as tolerated and no driving for today, and no heavy lifting for 1 weeks Diet: cardiac diet and low fat, low cholesterol diet Wound Care:  Keep dressing in place for 24 hours. You can then remove it and shower as normal. DO not soak in bathtub  Follow-up with Dr. Edilia Bo in 6 weeks with ABIs  Signed: Graceann Congress, PA-C 02/10/2023 2:19 PM [facility] Office: [phone]

## 2023-02-11 ENCOUNTER — Encounter (HOSPITAL_BASED_OUTPATIENT_CLINIC_OR_DEPARTMENT_OTHER): Payer: Medicare HMO | Admitting: Internal Medicine

## 2023-02-11 DIAGNOSIS — G40909 Epilepsy, unspecified, not intractable, without status epilepticus: Secondary | ICD-10-CM | POA: Diagnosis not present

## 2023-02-11 DIAGNOSIS — E039 Hypothyroidism, unspecified: Secondary | ICD-10-CM | POA: Diagnosis not present

## 2023-02-11 DIAGNOSIS — E11621 Type 2 diabetes mellitus with foot ulcer: Secondary | ICD-10-CM | POA: Diagnosis not present

## 2023-02-11 DIAGNOSIS — N179 Acute kidney failure, unspecified: Secondary | ICD-10-CM | POA: Diagnosis not present

## 2023-02-11 DIAGNOSIS — A419 Sepsis, unspecified organism: Secondary | ICD-10-CM | POA: Diagnosis not present

## 2023-02-11 DIAGNOSIS — N390 Urinary tract infection, site not specified: Secondary | ICD-10-CM | POA: Diagnosis not present

## 2023-02-11 DIAGNOSIS — G9341 Metabolic encephalopathy: Secondary | ICD-10-CM | POA: Diagnosis not present

## 2023-02-11 DIAGNOSIS — Z7982 Long term (current) use of aspirin: Secondary | ICD-10-CM | POA: Diagnosis not present

## 2023-02-11 DIAGNOSIS — E1159 Type 2 diabetes mellitus with other circulatory complications: Secondary | ICD-10-CM | POA: Diagnosis not present

## 2023-02-11 DIAGNOSIS — E440 Moderate protein-calorie malnutrition: Secondary | ICD-10-CM | POA: Diagnosis not present

## 2023-02-11 DIAGNOSIS — I152 Hypertension secondary to endocrine disorders: Secondary | ICD-10-CM | POA: Diagnosis not present

## 2023-02-11 DIAGNOSIS — E11649 Type 2 diabetes mellitus with hypoglycemia without coma: Secondary | ICD-10-CM | POA: Diagnosis not present

## 2023-02-11 DIAGNOSIS — L97518 Non-pressure chronic ulcer of other part of right foot with other specified severity: Secondary | ICD-10-CM | POA: Diagnosis not present

## 2023-02-11 DIAGNOSIS — L89152 Pressure ulcer of sacral region, stage 2: Secondary | ICD-10-CM | POA: Diagnosis not present

## 2023-02-11 DIAGNOSIS — L97528 Non-pressure chronic ulcer of other part of left foot with other specified severity: Secondary | ICD-10-CM | POA: Diagnosis not present

## 2023-02-11 DIAGNOSIS — E1151 Type 2 diabetes mellitus with diabetic peripheral angiopathy without gangrene: Secondary | ICD-10-CM | POA: Diagnosis not present

## 2023-02-11 DIAGNOSIS — Z794 Long term (current) use of insulin: Secondary | ICD-10-CM | POA: Diagnosis not present

## 2023-02-11 DIAGNOSIS — F0390 Unspecified dementia without behavioral disturbance: Secondary | ICD-10-CM | POA: Diagnosis not present

## 2023-02-11 DIAGNOSIS — N182 Chronic kidney disease, stage 2 (mild): Secondary | ICD-10-CM | POA: Diagnosis not present

## 2023-02-11 DIAGNOSIS — L89896 Pressure-induced deep tissue damage of other site: Secondary | ICD-10-CM | POA: Diagnosis not present

## 2023-02-11 DIAGNOSIS — E1122 Type 2 diabetes mellitus with diabetic chronic kidney disease: Secondary | ICD-10-CM | POA: Diagnosis not present

## 2023-02-11 DIAGNOSIS — D631 Anemia in chronic kidney disease: Secondary | ICD-10-CM | POA: Diagnosis not present

## 2023-02-14 ENCOUNTER — Emergency Department (HOSPITAL_COMMUNITY): Payer: Medicare HMO

## 2023-02-14 ENCOUNTER — Inpatient Hospital Stay (HOSPITAL_COMMUNITY)
Admission: EM | Admit: 2023-02-14 | Discharge: 2023-02-17 | DRG: 193 | Disposition: A | Discharge status: Home / Self Care | Payer: Medicare HMO | Attending: Internal Medicine | Admitting: Internal Medicine

## 2023-02-14 DIAGNOSIS — Z87898 Personal history of other specified conditions: Secondary | ICD-10-CM

## 2023-02-14 DIAGNOSIS — M545 Low back pain, unspecified: Secondary | ICD-10-CM | POA: Diagnosis present

## 2023-02-14 DIAGNOSIS — L89621 Pressure ulcer of left heel, stage 1: Secondary | ICD-10-CM | POA: Diagnosis present

## 2023-02-14 DIAGNOSIS — I10 Essential (primary) hypertension: Secondary | ICD-10-CM | POA: Diagnosis present

## 2023-02-14 DIAGNOSIS — Z825 Family history of asthma and other chronic lower respiratory diseases: Secondary | ICD-10-CM

## 2023-02-14 DIAGNOSIS — M810 Age-related osteoporosis without current pathological fracture: Secondary | ICD-10-CM | POA: Diagnosis present

## 2023-02-14 DIAGNOSIS — L89152 Pressure ulcer of sacral region, stage 2: Secondary | ICD-10-CM | POA: Diagnosis present

## 2023-02-14 DIAGNOSIS — E1151 Type 2 diabetes mellitus with diabetic peripheral angiopathy without gangrene: Secondary | ICD-10-CM | POA: Diagnosis present

## 2023-02-14 DIAGNOSIS — Z7902 Long term (current) use of antithrombotics/antiplatelets: Secondary | ICD-10-CM

## 2023-02-14 DIAGNOSIS — M3313 Other dermatomyositis without myopathy: Secondary | ICD-10-CM | POA: Diagnosis present

## 2023-02-14 DIAGNOSIS — R4182 Altered mental status, unspecified: Secondary | ICD-10-CM | POA: Diagnosis not present

## 2023-02-14 DIAGNOSIS — D509 Iron deficiency anemia, unspecified: Secondary | ICD-10-CM | POA: Diagnosis present

## 2023-02-14 DIAGNOSIS — J181 Lobar pneumonia, unspecified organism: Secondary | ICD-10-CM | POA: Diagnosis not present

## 2023-02-14 DIAGNOSIS — R404 Transient alteration of awareness: Secondary | ICD-10-CM | POA: Diagnosis not present

## 2023-02-14 DIAGNOSIS — E871 Hypo-osmolality and hyponatremia: Secondary | ICD-10-CM | POA: Diagnosis present

## 2023-02-14 DIAGNOSIS — Z800 Family history of malignant neoplasm of digestive organs: Secondary | ICD-10-CM

## 2023-02-14 DIAGNOSIS — R1310 Dysphagia, unspecified: Secondary | ICD-10-CM | POA: Diagnosis present

## 2023-02-14 DIAGNOSIS — G40909 Epilepsy, unspecified, not intractable, without status epilepticus: Secondary | ICD-10-CM | POA: Diagnosis present

## 2023-02-14 DIAGNOSIS — N3281 Overactive bladder: Secondary | ICD-10-CM | POA: Diagnosis present

## 2023-02-14 DIAGNOSIS — L89611 Pressure ulcer of right heel, stage 1: Secondary | ICD-10-CM | POA: Diagnosis present

## 2023-02-14 DIAGNOSIS — Z886 Allergy status to analgesic agent status: Secondary | ICD-10-CM

## 2023-02-14 DIAGNOSIS — I739 Peripheral vascular disease, unspecified: Secondary | ICD-10-CM | POA: Diagnosis present

## 2023-02-14 DIAGNOSIS — Z833 Family history of diabetes mellitus: Secondary | ICD-10-CM

## 2023-02-14 DIAGNOSIS — Z993 Dependence on wheelchair: Secondary | ICD-10-CM

## 2023-02-14 DIAGNOSIS — Z743 Need for continuous supervision: Secondary | ICD-10-CM | POA: Diagnosis not present

## 2023-02-14 DIAGNOSIS — E119 Type 2 diabetes mellitus without complications: Secondary | ICD-10-CM

## 2023-02-14 DIAGNOSIS — L309 Dermatitis, unspecified: Secondary | ICD-10-CM | POA: Diagnosis present

## 2023-02-14 DIAGNOSIS — L509 Urticaria, unspecified: Secondary | ICD-10-CM | POA: Diagnosis present

## 2023-02-14 DIAGNOSIS — L89892 Pressure ulcer of other site, stage 2: Secondary | ICD-10-CM | POA: Diagnosis present

## 2023-02-14 DIAGNOSIS — I959 Hypotension, unspecified: Secondary | ICD-10-CM | POA: Diagnosis present

## 2023-02-14 DIAGNOSIS — R55 Syncope and collapse: Secondary | ICD-10-CM | POA: Diagnosis not present

## 2023-02-14 DIAGNOSIS — R9431 Abnormal electrocardiogram [ECG] [EKG]: Secondary | ICD-10-CM | POA: Diagnosis not present

## 2023-02-14 DIAGNOSIS — E7800 Pure hypercholesterolemia, unspecified: Secondary | ICD-10-CM | POA: Diagnosis present

## 2023-02-14 DIAGNOSIS — Z87440 Personal history of urinary (tract) infections: Secondary | ICD-10-CM

## 2023-02-14 DIAGNOSIS — E039 Hypothyroidism, unspecified: Secondary | ICD-10-CM | POA: Diagnosis present

## 2023-02-14 DIAGNOSIS — Z66 Do not resuscitate: Secondary | ICD-10-CM | POA: Diagnosis present

## 2023-02-14 DIAGNOSIS — G9341 Metabolic encephalopathy: Secondary | ICD-10-CM | POA: Diagnosis present

## 2023-02-14 DIAGNOSIS — M1990 Unspecified osteoarthritis, unspecified site: Secondary | ICD-10-CM | POA: Diagnosis present

## 2023-02-14 DIAGNOSIS — R569 Unspecified convulsions: Secondary | ICD-10-CM | POA: Diagnosis not present

## 2023-02-14 DIAGNOSIS — Z8249 Family history of ischemic heart disease and other diseases of the circulatory system: Secondary | ICD-10-CM

## 2023-02-14 DIAGNOSIS — R918 Other nonspecific abnormal finding of lung field: Secondary | ICD-10-CM | POA: Diagnosis not present

## 2023-02-14 DIAGNOSIS — R739 Hyperglycemia, unspecified: Secondary | ICD-10-CM | POA: Diagnosis not present

## 2023-02-14 DIAGNOSIS — E1165 Type 2 diabetes mellitus with hyperglycemia: Secondary | ICD-10-CM | POA: Diagnosis present

## 2023-02-14 DIAGNOSIS — Z720 Tobacco use: Secondary | ICD-10-CM

## 2023-02-14 DIAGNOSIS — E440 Moderate protein-calorie malnutrition: Secondary | ICD-10-CM | POA: Diagnosis present

## 2023-02-14 DIAGNOSIS — Z882 Allergy status to sulfonamides status: Secondary | ICD-10-CM

## 2023-02-14 DIAGNOSIS — Z79899 Other long term (current) drug therapy: Secondary | ICD-10-CM

## 2023-02-14 DIAGNOSIS — Z6821 Body mass index (BMI) 21.0-21.9, adult: Secondary | ICD-10-CM

## 2023-02-14 DIAGNOSIS — K219 Gastro-esophageal reflux disease without esophagitis: Secondary | ICD-10-CM | POA: Diagnosis present

## 2023-02-14 DIAGNOSIS — Z888 Allergy status to other drugs, medicaments and biological substances status: Secondary | ICD-10-CM

## 2023-02-14 DIAGNOSIS — Z794 Long term (current) use of insulin: Secondary | ICD-10-CM

## 2023-02-14 DIAGNOSIS — F0390 Unspecified dementia without behavioral disturbance: Secondary | ICD-10-CM | POA: Diagnosis present

## 2023-02-14 DIAGNOSIS — I700 Atherosclerosis of aorta: Secondary | ICD-10-CM | POA: Diagnosis not present

## 2023-02-14 DIAGNOSIS — Z1152 Encounter for screening for COVID-19: Secondary | ICD-10-CM

## 2023-02-14 DIAGNOSIS — J189 Pneumonia, unspecified organism: Secondary | ICD-10-CM

## 2023-02-14 DIAGNOSIS — I129 Hypertensive chronic kidney disease with stage 1 through stage 4 chronic kidney disease, or unspecified chronic kidney disease: Secondary | ICD-10-CM | POA: Diagnosis present

## 2023-02-14 DIAGNOSIS — B370 Candidal stomatitis: Secondary | ICD-10-CM | POA: Diagnosis present

## 2023-02-14 DIAGNOSIS — L89121 Pressure ulcer of left upper back, stage 1: Secondary | ICD-10-CM | POA: Diagnosis present

## 2023-02-14 DIAGNOSIS — J9601 Acute respiratory failure with hypoxia: Secondary | ICD-10-CM | POA: Diagnosis present

## 2023-02-14 DIAGNOSIS — E1159 Type 2 diabetes mellitus with other circulatory complications: Secondary | ICD-10-CM | POA: Diagnosis present

## 2023-02-14 DIAGNOSIS — Z79890 Hormone replacement therapy (postmenopausal): Secondary | ICD-10-CM

## 2023-02-14 DIAGNOSIS — G8929 Other chronic pain: Secondary | ICD-10-CM | POA: Diagnosis present

## 2023-02-14 LAB — COMPREHENSIVE METABOLIC PANEL
ALT: 20 U/L (ref 0–44)
AST: 25 U/L (ref 15–41)
Albumin: 3.1 g/dL — ABNORMAL LOW (ref 3.5–5.0)
Alkaline Phosphatase: 74 U/L (ref 38–126)
Anion gap: 8 (ref 5–15)
BUN: 22 mg/dL (ref 8–23)
CO2: 26 mmol/L (ref 22–32)
Calcium: 9.1 mg/dL (ref 8.9–10.3)
Chloride: 92 mmol/L — ABNORMAL LOW (ref 98–111)
Creatinine, Ser: 0.72 mg/dL (ref 0.44–1.00)
GFR, Estimated: >60 mL/min (ref >60)
Glucose, Bld: 227 mg/dL — ABNORMAL HIGH (ref 70–99)
Potassium: 4 mmol/L (ref 3.5–5.1)
Sodium: 126 mmol/L — ABNORMAL LOW (ref 135–145)
Total Bilirubin: 0.4 mg/dL (ref 0.3–1.2)
Total Protein: 6.4 g/dL — ABNORMAL LOW (ref 6.5–8.1)

## 2023-02-14 LAB — CBC WITH DIFFERENTIAL/PLATELET
Abs Immature Granulocytes: 0.04 10*3/uL (ref 0.00–0.07)
Basophils Absolute: 0 10*3/uL (ref 0.0–0.1)
Basophils Relative: 0 %
Eosinophils Absolute: 0.2 10*3/uL (ref 0.0–0.5)
Eosinophils Relative: 3 %
HCT: 26.7 % — ABNORMAL LOW (ref 36.0–46.0)
Hemoglobin: 8.8 g/dL — ABNORMAL LOW (ref 12.0–15.0)
Immature Granulocytes: 1 %
Lymphocytes Relative: 9 %
Lymphs Abs: 0.5 10*3/uL — ABNORMAL LOW (ref 0.7–4.0)
MCH: 32.8 pg (ref 26.0–34.0)
MCHC: 33 g/dL (ref 30.0–36.0)
MCV: 99.6 fL (ref 80.0–100.0)
Monocytes Absolute: 0.3 10*3/uL (ref 0.1–1.0)
Monocytes Relative: 6 %
Neutro Abs: 4.5 10*3/uL (ref 1.7–7.7)
Neutrophils Relative %: 81 %
Platelets: 277 10*3/uL (ref 150–400)
RBC: 2.68 MIL/uL — ABNORMAL LOW (ref 3.87–5.11)
RDW: 14.2 % (ref 11.5–15.5)
WBC: 5.6 10*3/uL (ref 4.0–10.5)
nRBC: 0 % (ref 0.0–0.2)

## 2023-02-14 LAB — AMMONIA: Ammonia: <10 umol/L (ref 9–35)

## 2023-02-14 LAB — ETHANOL: Alcohol, Ethyl (B): <10 mg/dL (ref <10)

## 2023-02-14 NOTE — ED Provider Notes (Signed)
[place] EMERGENCY DEPARTMENT AT [facility] Provider Note   CSN: [id number] Arrival date & time: 02/14/23  2202     History {Add pertinent medical, surgical, social history, OB history to HPI:1} Chief Complaint  Patient presents with   Altered Mental Status    [name] [name] is a 89 y.o. female.  The history is provided by the patient and medical records.  Altered Mental Status  LEVEL V CAVEAT:  AMS, DEMENTIA  89 year old female with history of dementia, chronic kidney disease, GERD, history of seizures, hypertension, hypothyroidism, peripheral arterial disease, presenting to the ED for altered mental status.  Family called EMS as patient had acute change in mental status at home tonight.  She does have history of focal seizures, however no mention of observed seizure by family.  Upon EMS arrival she was hypoxic into the 80s but improved with nonrebreather.  She was also hypotensive and was given 500 cc fluid in route.  On arrival to the ED patient is awake but is not answering questions and does not follow commands.  No family present at bedside currently for additional history.  Home Medications Prior to Admission medications   Medication Sig Start Date End Date Taking? Authorizing Provider  acetaminophen (TYLENOL) 650 MG CR tablet Take 650 mg by mouth every 8 (eight) hours as needed for pain.    [provider]  BAYER LOW DOSE 81 MG EC tablet Take 81 mg by mouth in the morning. Swallow whole.    [provider]  cetirizine (ZYRTEC) 10 MG tablet Take 10 mg by mouth in the morning, at noon, and at bedtime.    [provider]  Cholecalciferol (VITAMIN D-3 PO) Take 2,000 Units by mouth daily.    [provider]  clopidogrel (PLAVIX) 75 MG tablet Take 1 tablet (75 mg total) by mouth daily with breakfast. 02/09/23   Baglia, Corrina, PA-C  collagenase (SANTYL) 250 UNIT/GM ointment Apply 1 Application topically 2 (two) times a week. Applied  to affected areas of toes/feet.    [provider]  ezetimibe (ZETIA) 10 MG tablet Take 1 tablet (10 mg total) by mouth daily. 02/09/23 02/09/24  Chuck Hint, MD  Famotidine (PEPCID AC PO) Take 20 mg by mouth at bedtime.    [provider]  ferrous sulfate 220 (44 Fe) MG/5ML solution Take 330 mg by mouth 3 (three) times a week. 12/27/22   [provider]  folic acid (FOLVITE) 1 MG tablet Take 1 mg by mouth at bedtime.    [provider]  insulin aspart (NOVOLOG FLEXPEN) 100 UNIT/ML FlexPen Inject 2-10 Units into the skin See admin instructions. Inject 2-10 units into the skin three times a day with meals, per sliding scale:  Breakfast: BGL 80-199 = 8 units; 200-299 = 9 units; 300 or greater = 10 units Lunch: BGL 80-199 = 5 units; 200-299 = 6 units; 300 or greater = 7 units Supper/evening meal: BGL 80-199 = 2 units; 200-299 = 3 units; 300 or greater = 4 units    [provider]  insulin detemir (LEVEMIR FLEXTOUCH) 100 UNIT/ML FlexPen Inject 3-7 Units into the skin See admin instructions. Inject 7 units into the skin in the morning and 3 units at bedtime    [provider]  levETIRAcetam (KEPPRA) 500 MG tablet Take 500 mg by mouth 2 (two) times daily. 01/11/23   [provider]  levothyroxine (SYNTHROID) 100 MCG tablet Take 1 tablet (100 mcg total) by mouth daily  before breakfast. 09/12/22 02/03/25  Jerald Kief, MD  liver oil-zinc oxide (DESITIN) 40 % ointment Apply topically 3 (three) times daily. Patient taking differently: Apply 1 Application topically as needed for irritation. 09/16/21   Rolly Salter, MD  loperamide (IMODIUM A-D) 2 MG tablet Take 2 mg by mouth 4 (four) times daily as needed for diarrhea or loose stools.    [provider]  methotrexate (RHEUMATREX) 2.5 MG tablet Take 1 tablet (2.5 mg total) by mouth once a week. Caution:Chemotherapy. Protect from light. Patient taking differently: Take 12.5 mg by  mouth every Saturday. Caution:Chemotherapy. Protect from light. 01/27/22   Marinda Elk, MD  metoprolol tartrate (LOPRESSOR) 25 MG tablet Take 1 tablet (25 mg total) by mouth 2 (two) times daily. Patient taking differently: Take 12.5 mg by mouth as needed (high blood pressure). 01/20/22   Marinda Elk, MD  midodrine (PROAMATINE) 5 MG tablet Take 0.5 tablets (2.5 mg total) by mouth 3 (three) times daily with meals. Patient taking differently: Take 2.5 mg by mouth 3 (three) times daily as needed (low blood pressure). 10/05/22   Osvaldo Shipper, MD  Multiple Vitamin (MULTIVITAMIN WITH MINERALS) TABS tablet Take 1 tablet by mouth in the morning.    [provider]  PLAQUENIL 200 MG tablet Take 200 mg by mouth at bedtime. 10/07/22   [provider]  polyethylene glycol (MIRALAX / GLYCOLAX) 17 g packet Take 17 g by mouth daily. Patient taking differently: Take 17 g by mouth daily as needed for mild constipation. 09/16/21   Rolly Salter, MD  predniSONE (DELTASONE) 5 MG tablet Take 5 mg by mouth daily with breakfast.    [provider]  triamcinolone ointment (KENALOG) 0.1 % Apply 1 Application topically 2 (two) times daily. 07/16/22   [provider]      Allergies    Nsaids, Biaxin [clarithromycin], Statins, Bactrim [sulfamethoxazole-trimethoprim], Lisinopril, and Sulfa antibiotics    Review of Systems   Review of Systems  Unable to perform ROS: Dementia    Physical Exam Updated Vital Signs BP (!) 150/55 (BP Location: Left Arm)   Pulse (!) 102   Temp 98.2 F (36.8 C) (Oral)   Resp 16   SpO2 98%   Physical Exam Vitals and nursing note reviewed.  Constitutional:      Appearance: She is well-developed.     Comments: Elderly, awake   HENT:     Head: Normocephalic and atraumatic.  Eyes:     Conjunctiva/sclera: Conjunctivae normal.     Pupils: Pupils are equal, round, and reactive to light.  Cardiovascular:     Rate and Rhythm: Normal rate  and regular rhythm.     Heart sounds: Normal heart sounds.  Pulmonary:     Effort: Pulmonary effort is normal.     Breath sounds: Normal breath sounds.  Abdominal:     General: Bowel sounds are normal.     Palpations: Abdomen is soft.  Musculoskeletal:        General: Normal range of motion.     Cervical back: Normal range of motion.  Skin:    General: Skin is warm and dry.  Neurological:     Comments: Awake, looking around room but does not answer questions or follow commands when prompted, no focal seizure activity observed     ED Results / Procedures / Treatments   Labs (all labs ordered are listed, but only abnormal results are displayed) Labs Reviewed  CBC WITH DIFFERENTIAL/PLATELET - Abnormal; Notable  for the following components:      Result Value   RBC 2.68 (*)    Hemoglobin 8.8 (*)    HCT 26.7 (*)    Lymphs Abs 0.5 (*)    All other components within normal limits  COMPREHENSIVE METABOLIC PANEL - Abnormal; Notable for the following components:   Sodium 126 (*)    Chloride 92 (*)    Glucose, Bld 227 (*)    Total Protein 6.4 (*)    Albumin 3.1 (*)    All other components within normal limits  RESP PANEL BY RT-PCR (RSV, FLU A&B, COVID)  [id number]  ETHANOL  AMMONIA  URINALYSIS, W/ REFLEX TO CULTURE (INFECTION SUSPECTED)  TSH  T4, FREE  LEVETIRACETAM LEVEL  RAPID URINE DRUG SCREEN, HOSP PERFORMED    EKG None  Radiology DG Chest Port 1 View  Result Date: 02/14/2023 CLINICAL DATA:  AMS EXAM: PORTABLE CHEST 1 VIEW COMPARISON:  Chest x-ray 01/04/2023 FINDINGS: Correlate cardiac device overlying the left chest The heart and mediastinal contours are within normal limits. Aortic calcification. Biapical pleural/pulmonary scarring. Patchy airspace opacity within the left lower lobe. No pulmonary edema. No pleural effusion. No pneumothorax. No acute osseous abnormality. IMPRESSION: 1. Patchy airspace opacity within the left lower lobe. Followup PA and lateral chest X-ray is  recommended in 3-4 weeks following therapy to ensure resolution and exclude underlying malignancy. 2.  Aortic Atherosclerosis (ICD10-I70.0). Electronically Signed   By: Tish Frederickson M.D.   On: 02/14/2023 23:02   CT HEAD WO CONTRAST ( )  Result Date: 02/14/2023 CLINICAL DATA:  Seizure and altered mental status EXAM: CT HEAD WITHOUT CONTRAST TECHNIQUE: Contiguous axial images were obtained from the base of the skull through the vertex without intravenous contrast. RADIATION DOSE REDUCTION: This exam was performed according to the departmental dose-optimization program which includes automated exposure control, adjustment of the mA and/or kV according to patient size and/or use of iterative reconstruction technique. COMPARISON:  None Available. FINDINGS: Brain: There is no mass, hemorrhage or extra-axial collection. The size and configuration of the ventricles and extra-axial CSF spaces are normal. There is hypoattenuation of the white matter, most commonly indicating chronic small vessel disease. Vascular: No abnormal hyperdensity of the major intracranial arteries or dural venous sinuses. No intracranial atherosclerosis. Skull: The visualized skull base, calvarium and extracranial soft tissues are normal. Sinuses/Orbits: No fluid levels or advanced mucosal thickening of the visualized paranasal sinuses. No mastoid or middle ear effusion. The orbits are normal. IMPRESSION: Chronic small vessel disease without acute intracranial abnormality. Electronically Signed   By: Deatra Robinson M.D.   On: 02/14/2023 22:44    Procedures Procedures  {Document cardiac monitor, telemetry assessment procedure when appropriate:1}  Medications Ordered in ED Medications  levofloxacin (LEVAQUIN) IVPB 500 mg (has no administration in time range)    ED Course/ Medical Decision Making/ A&P   {   Click here for ABCD2, HEART and other calculatorsREFRESH Note before signing :1}                          Medical Decision  Making Amount and/or Complexity of Data Reviewed Labs: ordered. Radiology: ordered and independent interpretation performed. ECG/medicine tests: ordered and independent interpretation performed.     11:55 PM Family arrived at bedside.  Patient has been having cough/congestion recently.  No fevers.  Confirms no focal seizure activity witnessed today.  Will start on abx for PNA and plan to admit for ongoing care.    Discussed  with pharmacy given recurrent nature of her PNA and allergies, recommended Levaquin 500mg  Q24H.  Final Clinical Impression(s) / ED Diagnoses Final diagnoses:  None    Rx / DC Orders ED Discharge Orders     None

## 2023-02-14 NOTE — ED Triage Notes (Signed)
Patient arrived via [facility] after family reports altered mental status. States hx of seizures. Upon EMS arrival patient hypotensive and O2 reading in the 80s, placed on NRB and saturations normalized by hospital arrival. Given 500cc fluids en route.

## 2023-02-15 ENCOUNTER — Encounter (HOSPITAL_COMMUNITY): Payer: Self-pay | Admitting: Emergency Medicine

## 2023-02-15 ENCOUNTER — Inpatient Hospital Stay (HOSPITAL_COMMUNITY): Payer: Medicare HMO

## 2023-02-15 DIAGNOSIS — Z794 Long term (current) use of insulin: Secondary | ICD-10-CM | POA: Diagnosis not present

## 2023-02-15 DIAGNOSIS — E871 Hypo-osmolality and hyponatremia: Secondary | ICD-10-CM | POA: Diagnosis not present

## 2023-02-15 DIAGNOSIS — E1151 Type 2 diabetes mellitus with diabetic peripheral angiopathy without gangrene: Secondary | ICD-10-CM | POA: Diagnosis not present

## 2023-02-15 DIAGNOSIS — G9341 Metabolic encephalopathy: Secondary | ICD-10-CM | POA: Diagnosis not present

## 2023-02-15 DIAGNOSIS — E039 Hypothyroidism, unspecified: Secondary | ICD-10-CM | POA: Diagnosis not present

## 2023-02-15 DIAGNOSIS — J181 Lobar pneumonia, unspecified organism: Secondary | ICD-10-CM | POA: Diagnosis not present

## 2023-02-15 DIAGNOSIS — R112 Nausea with vomiting, unspecified: Secondary | ICD-10-CM | POA: Diagnosis not present

## 2023-02-15 DIAGNOSIS — R4182 Altered mental status, unspecified: Secondary | ICD-10-CM | POA: Diagnosis present

## 2023-02-15 DIAGNOSIS — L89152 Pressure ulcer of sacral region, stage 2: Secondary | ICD-10-CM | POA: Diagnosis not present

## 2023-02-15 DIAGNOSIS — M810 Age-related osteoporosis without current pathological fracture: Secondary | ICD-10-CM | POA: Diagnosis not present

## 2023-02-15 DIAGNOSIS — L89121 Pressure ulcer of left upper back, stage 1: Secondary | ICD-10-CM | POA: Diagnosis not present

## 2023-02-15 DIAGNOSIS — I959 Hypotension, unspecified: Secondary | ICD-10-CM | POA: Diagnosis not present

## 2023-02-15 DIAGNOSIS — I129 Hypertensive chronic kidney disease with stage 1 through stage 4 chronic kidney disease, or unspecified chronic kidney disease: Secondary | ICD-10-CM | POA: Diagnosis not present

## 2023-02-15 DIAGNOSIS — R54 Age-related physical debility: Secondary | ICD-10-CM | POA: Diagnosis not present

## 2023-02-15 DIAGNOSIS — E1165 Type 2 diabetes mellitus with hyperglycemia: Secondary | ICD-10-CM | POA: Diagnosis not present

## 2023-02-15 DIAGNOSIS — G40909 Epilepsy, unspecified, not intractable, without status epilepticus: Secondary | ICD-10-CM | POA: Diagnosis not present

## 2023-02-15 DIAGNOSIS — L89892 Pressure ulcer of other site, stage 2: Secondary | ICD-10-CM | POA: Diagnosis not present

## 2023-02-15 DIAGNOSIS — Z66 Do not resuscitate: Secondary | ICD-10-CM | POA: Diagnosis not present

## 2023-02-15 DIAGNOSIS — B370 Candidal stomatitis: Secondary | ICD-10-CM | POA: Diagnosis not present

## 2023-02-15 DIAGNOSIS — E440 Moderate protein-calorie malnutrition: Secondary | ICD-10-CM | POA: Diagnosis not present

## 2023-02-15 DIAGNOSIS — M3390 Dermatopolymyositis, unspecified, organ involvement unspecified: Secondary | ICD-10-CM | POA: Diagnosis not present

## 2023-02-15 DIAGNOSIS — F0390 Unspecified dementia without behavioral disturbance: Secondary | ICD-10-CM | POA: Diagnosis not present

## 2023-02-15 DIAGNOSIS — J189 Pneumonia, unspecified organism: Secondary | ICD-10-CM | POA: Diagnosis not present

## 2023-02-15 DIAGNOSIS — J9601 Acute respiratory failure with hypoxia: Secondary | ICD-10-CM | POA: Diagnosis not present

## 2023-02-15 DIAGNOSIS — K219 Gastro-esophageal reflux disease without esophagitis: Secondary | ICD-10-CM | POA: Diagnosis not present

## 2023-02-15 DIAGNOSIS — D509 Iron deficiency anemia, unspecified: Secondary | ICD-10-CM | POA: Diagnosis not present

## 2023-02-15 DIAGNOSIS — Z1152 Encounter for screening for COVID-19: Secondary | ICD-10-CM | POA: Diagnosis not present

## 2023-02-15 DIAGNOSIS — E7800 Pure hypercholesterolemia, unspecified: Secondary | ICD-10-CM | POA: Diagnosis not present

## 2023-02-15 DIAGNOSIS — M3313 Other dermatomyositis without myopathy: Secondary | ICD-10-CM | POA: Diagnosis not present

## 2023-02-15 LAB — CBC
HCT: 25.5 % — ABNORMAL LOW (ref 36.0–46.0)
Hemoglobin: 8.6 g/dL — ABNORMAL LOW (ref 12.0–15.0)
MCH: 33.1 pg (ref 26.0–34.0)
MCHC: 33.7 g/dL (ref 30.0–36.0)
MCV: 98.1 fL (ref 80.0–100.0)
Platelets: 291 10*3/uL (ref 150–400)
RBC: 2.6 MIL/uL — ABNORMAL LOW (ref 3.87–5.11)
RDW: 13.6 % (ref 11.5–15.5)
WBC: 9.4 10*3/uL (ref 4.0–10.5)
nRBC: 0 % (ref 0.0–0.2)

## 2023-02-15 LAB — COMPREHENSIVE METABOLIC PANEL
ALT: 20 U/L (ref 0–44)
AST: 27 U/L (ref 15–41)
Albumin: 3 g/dL — ABNORMAL LOW (ref 3.5–5.0)
Alkaline Phosphatase: 69 U/L (ref 38–126)
Anion gap: 8 (ref 5–15)
BUN: 19 mg/dL (ref 8–23)
CO2: 26 mmol/L (ref 22–32)
Calcium: 9 mg/dL (ref 8.9–10.3)
Chloride: 93 mmol/L — ABNORMAL LOW (ref 98–111)
Creatinine, Ser: 0.78 mg/dL (ref 0.44–1.00)
GFR, Estimated: >60 mL/min (ref >60)
Glucose, Bld: 276 mg/dL — ABNORMAL HIGH (ref 70–99)
Potassium: 4.1 mmol/L (ref 3.5–5.1)
Sodium: 127 mmol/L — ABNORMAL LOW (ref 135–145)
Total Bilirubin: 0.5 mg/dL (ref 0.3–1.2)
Total Protein: 6.3 g/dL — ABNORMAL LOW (ref 6.5–8.1)

## 2023-02-15 LAB — CBG MONITORING, ED
Glucose-Capillary: 370 mg/dL — ABNORMAL HIGH (ref 70–99)
Glucose-Capillary: 262 mg/dL — ABNORMAL HIGH (ref 70–99)

## 2023-02-15 LAB — FERRITIN: Ferritin: 312 ng/mL — ABNORMAL HIGH (ref 11–307)

## 2023-02-15 LAB — RETICULOCYTES
Immature Retic Fract: 9.6 % (ref 2.3–15.9)
RBC.: 2.63 MIL/uL — ABNORMAL LOW (ref 3.87–5.11)
Retic Count, Absolute: 58.1 10*3/uL (ref 19.0–186.0)
Retic Ct Pct: 2.2 % (ref 0.4–3.1)

## 2023-02-15 LAB — BLOOD GAS, VENOUS
Acid-base deficit: 0.8 mmol/L (ref 0.0–2.0)
Bicarbonate: 24.9 mmol/L (ref 20.0–28.0)
O2 Saturation: 64.3 %
Patient temperature: 36.1
pCO2, Ven: 42 mmHg — ABNORMAL LOW (ref 44–60)
pH, Ven: 7.37 (ref 7.25–7.43)
pO2, Ven: 36 mmHg (ref 32–45)

## 2023-02-15 LAB — GLUCOSE, CAPILLARY
Glucose-Capillary: 100 mg/dL — ABNORMAL HIGH (ref 70–99)
Glucose-Capillary: 108 mg/dL — ABNORMAL HIGH (ref 70–99)

## 2023-02-15 LAB — IRON AND TIBC
Iron: 14 ug/dL — ABNORMAL LOW (ref 28–170)
Saturation Ratios: 7 % — ABNORMAL LOW (ref 10.4–31.8)
TIBC: 209 ug/dL — ABNORMAL LOW (ref 250–450)
UIBC: 195 ug/dL

## 2023-02-15 LAB — PHOSPHORUS: Phosphorus: 2.9 mg/dL (ref 2.5–4.6)

## 2023-02-15 LAB — MRSA NEXT GEN BY PCR, NASAL: MRSA by PCR Next Gen: NOT DETECTED

## 2023-02-15 LAB — FOLATE: Folate: 11.4 ng/mL (ref >5.9)

## 2023-02-15 LAB — MAGNESIUM: Magnesium: 1.6 mg/dL — ABNORMAL LOW (ref 1.7–2.4)

## 2023-02-15 LAB — VITAMIN B12: Vitamin B-12: 1299 pg/mL — ABNORMAL HIGH (ref 180–914)

## 2023-02-15 MED ORDER — CETIRIZINE HCL 5 MG/5ML PO SOLN
5.0000 mg | Freq: Once | ORAL | Status: DC
  Filled 2023-02-15: qty 5

## 2023-02-15 MED ORDER — INSULIN ASPART 100 UNIT/ML IJ SOLN
0.0000 [IU] | Freq: Three times a day (TID) | INTRAMUSCULAR | Status: DC
  Administered 2023-02-15: 3 [IU] via SUBCUTANEOUS
  Administered 2023-02-15: 5 [IU] via SUBCUTANEOUS
  Administered 2023-02-16 (×3): 1 [IU] via SUBCUTANEOUS
  Administered 2023-02-17: 5 [IU] via SUBCUTANEOUS
  Filled 2023-02-15: qty 0.06

## 2023-02-15 MED ORDER — HYDROXYCHLOROQUINE SULFATE 200 MG PO TABS
200.0000 mg | ORAL_TABLET | Freq: Every day | ORAL | Status: DC
  Administered 2023-02-16: 200 mg via ORAL
  Filled 2023-02-15 (×3): qty 1

## 2023-02-15 MED ORDER — HYDRALAZINE HCL 20 MG/ML IJ SOLN: 5.0000 mg | Freq: Four times a day (QID) | INTRAMUSCULAR | Status: DC | PRN

## 2023-02-15 MED ORDER — ADULT MULTIVITAMIN W/MINERALS CH
1.0000 | ORAL_TABLET | Freq: Every day | ORAL | Status: DC
  Administered 2023-02-17: 1 via ORAL
  Filled 2023-02-15 (×2): qty 1

## 2023-02-15 MED ORDER — FLUCONAZOLE IN SODIUM CHLORIDE 200-0.9 MG/100ML-% IV SOLN
200.0000 mg | INTRAVENOUS | Status: DC
  Administered 2023-02-15 – 2023-02-16 (×2): 200 mg via INTRAVENOUS
  Filled 2023-02-15 (×3): qty 100

## 2023-02-15 MED ORDER — MAGNESIUM SULFATE 2 GM/50ML IV SOLN
2.0000 g | Freq: Once | INTRAVENOUS | Status: AC
  Administered 2023-02-15: 2 g via INTRAVENOUS
  Filled 2023-02-15: qty 50

## 2023-02-15 MED ORDER — ENOXAPARIN SODIUM 40 MG/0.4ML IJ SOSY
40.0000 mg | PREFILLED_SYRINGE | INTRAMUSCULAR | Status: DC
  Administered 2023-02-15 – 2023-02-17 (×3): 40 mg via SUBCUTANEOUS
  Filled 2023-02-15 (×3): qty 0.4

## 2023-02-15 MED ORDER — THIAMINE HCL 100 MG/ML IJ SOLN
500.0000 mg | Freq: Every day | INTRAVENOUS | Status: AC
  Administered 2023-02-15 – 2023-02-17 (×3): 500 mg via INTRAVENOUS
  Filled 2023-02-15 (×3): qty 5

## 2023-02-15 MED ORDER — LEVETIRACETAM IN NACL 500 MG/100ML IV SOLN
500.0000 mg | Freq: Two times a day (BID) | INTRAVENOUS | Status: DC
  Administered 2023-02-15 – 2023-02-17 (×6): 500 mg via INTRAVENOUS
  Filled 2023-02-15 (×6): qty 100

## 2023-02-15 MED ORDER — LEVOFLOXACIN IN D5W 500 MG/100ML IV SOLN: 500.0000 mg | INTRAVENOUS | Status: DC

## 2023-02-15 MED ORDER — COLLAGENASE 250 UNIT/GM EX OINT
TOPICAL_OINTMENT | Freq: Every day | CUTANEOUS | Status: DC
  Filled 2023-02-15: qty 30

## 2023-02-15 MED ORDER — FOLIC ACID 1 MG PO TABS
1.0000 mg | ORAL_TABLET | Freq: Every day | ORAL | Status: DC
  Administered 2023-02-16: 1 mg via ORAL
  Filled 2023-02-15: qty 1

## 2023-02-15 MED ORDER — INSULIN ASPART 100 UNIT/ML IJ SOLN
0.0000 [IU] | Freq: Three times a day (TID) | INTRAMUSCULAR | Status: DC
  Filled 2023-02-15: qty 0.06

## 2023-02-15 MED ORDER — EZETIMIBE 10 MG PO TABS
10.0000 mg | ORAL_TABLET | Freq: Every day | ORAL | Status: DC
  Administered 2023-02-17: 10 mg via ORAL
  Filled 2023-02-15 (×3): qty 1

## 2023-02-15 MED ORDER — POLYETHYLENE GLYCOL 3350 17 G PO PACK: 17.0000 g | PACK | Freq: Every day | ORAL | Status: DC | PRN

## 2023-02-15 MED ORDER — SODIUM CHLORIDE 0.9 % IV SOLN
1.5000 g | Freq: Four times a day (QID) | INTRAVENOUS | Status: DC
  Administered 2023-02-15 – 2023-02-17 (×10): 1.5 g via INTRAVENOUS
  Filled 2023-02-15 (×12): qty 4

## 2023-02-15 MED ORDER — LEVOTHYROXINE SODIUM 100 MCG PO TABS
100.0000 ug | ORAL_TABLET | Freq: Every day | ORAL | Status: DC
  Administered 2023-02-17: 100 ug via ORAL
  Filled 2023-02-15: qty 1

## 2023-02-15 MED ORDER — PREDNISONE 5 MG PO TABS
5.0000 mg | ORAL_TABLET | Freq: Every day | ORAL | Status: DC
  Administered 2023-02-17: 5 mg via ORAL
  Filled 2023-02-15 (×2): qty 1

## 2023-02-15 MED ORDER — TRIAMCINOLONE ACETONIDE 0.1 % EX OINT: 1.0000 | TOPICAL_OINTMENT | Freq: Two times a day (BID) | CUTANEOUS | Status: DC | PRN

## 2023-02-15 MED ORDER — ACETAMINOPHEN 325 MG PO TABS: 650.0000 mg | ORAL_TABLET | Freq: Four times a day (QID) | ORAL | Status: DC | PRN

## 2023-02-15 MED ORDER — FERROUS SULFATE 300 (60 FE) MG/5ML PO SOLN
300.0000 mg | ORAL | Status: DC
  Administered 2023-02-17: 300 mg via ORAL
  Filled 2023-02-15 (×2): qty 5

## 2023-02-15 MED ORDER — CLOPIDOGREL BISULFATE 75 MG PO TABS
75.0000 mg | ORAL_TABLET | Freq: Every day | ORAL | Status: DC
  Administered 2023-02-17: 75 mg via ORAL
  Filled 2023-02-15 (×3): qty 1

## 2023-02-15 MED ORDER — MIDODRINE HCL 5 MG PO TABS: 2.5000 mg | ORAL_TABLET | Freq: Three times a day (TID) | ORAL | Status: DC

## 2023-02-15 MED ORDER — SODIUM CHLORIDE 0.9 % IV SOLN: INTRAVENOUS | Status: DC

## 2023-02-15 MED ORDER — LEVOFLOXACIN IN D5W 500 MG/100ML IV SOLN
500.0000 mg | Freq: Once | INTRAVENOUS | Status: AC
  Administered 2023-02-15: 500 mg via INTRAVENOUS
  Filled 2023-02-15: qty 100

## 2023-02-15 MED ORDER — PROCHLORPERAZINE EDISYLATE 10 MG/2ML IJ SOLN: 5.0000 mg | Freq: Four times a day (QID) | INTRAMUSCULAR | Status: DC | PRN

## 2023-02-15 NOTE — H&P (Addendum)
History and Physical  [name] [name]:[id number] DOB: 28-Mar-1934 DOA: 02/14/2023  Referring physician: Sharilyn Sites, PA-EDP  PCP: [name], MD  Outpatient Specialists: Vascular surgery. Patient coming from: Home.  Chief Complaint: Altered mental status.  HPI: [name] [name] is a 89 y.o. female with medical history significant for eczema, urticaria, seizure disorder on Keppra, type 2 diabetes, hyperlipidemia, hypertension, hypothyroidism, peripheral vascular disease, dysphagia, history of recurrent aspiration pneumonia, pansensitive Enterococcus UTI, who presents due to altered mental status today.  Associated with nausea and vomiting x 1.  No reported subjective fevers or chills.  Has had an occasional nonproductive cough.  The patient is in the room accompanied by her 2 daughters.  They report that since her vascular procedure on Monday, after returning home, she has been sleeping more.  More fatigued and generally weaker.  They thought her symptoms were due to her new medications, Plavix and ezetimibe.    Today, the patient who is wheelchair-bound and is able to assist with transferring at baseline was a lot weaker.  After drinking her home Ensure, she had a large emesis.  No seizure activity observed by family.  EMS was called.    Reportedly, upon EMS arrival, the patient was hypoxic with O2 saturation in the 80s and was placed on nonrebreather.  She was also hypotensive and received IV fluid bolus en route.  The patient was brought into the ED for further evaluation.  ED Course: Temperature 98.2.  BP 187/98, pulse 87, respiratory rate 20, saturation 99% on room air.  Review of Systems: Review of systems as noted in the HPI. All other systems reviewed and are negative.   Past Medical History:  Diagnosis Date   Arthritis    "knees, legs" (06/10/2016)   Ascites    Chronic kidney disease    "related to my diabetes"   Chronic lower back pain    Diabetes mellitus without  complication (HCC)    Eczema    GERD (gastroesophageal reflux disease)    High cholesterol    Hyperlipidemia    Hypertension    Hypothyroidism    OAB (overactive bladder)    Osteoporosis    Thyroid disease    Type II diabetes mellitus (HCC)    Urticaria    Past Surgical History:  Procedure Laterality Date   ABDOMINAL AORTOGRAM W/LOWER EXTREMITY N/A 02/08/2023   Procedure: ABDOMINAL AORTOGRAM W/LOWER EXTREMITY;  Surgeon: Chuck Hint, MD;  Location[facility] INVASIVE CV LAB;  Service: Vascular;  Laterality: N/A;   ABDOMINAL HYSTERECTOMY     KNEE ARTHROSCOPY     LAPAROTOMY N/A 12/03/2016   Procedure: EXPLORATORY LAPAROTOMY, LYSIS OF ADHESIONS;  Surgeon: Darnell Level, MD;  Location: [facility] ORS;  Service: General;  Laterality: N/A;   LOOP RECORDER INSERTION N/A 08/23/2019   Procedure: LOOP RECORDER INSERTION;  Surgeon: Marinus Maw, MD;  Location: [facility] INVASIVE CV LAB;  Service: Cardiovascular;  Laterality: N/A;   PERIPHERAL VASCULAR INTERVENTION  02/08/2023   Procedure: PERIPHERAL VASCULAR INTERVENTION;  Surgeon: Chuck Hint, MD;  Location: [facility] INVASIVE CV LAB;  Service: Vascular;;   vocal cord polyps      Social History:  reports that she has never smoked. She quit smokeless tobacco use about 39 years ago.  Her smokeless tobacco use included chew. She reports that she does not drink alcohol and does not use drugs.   Allergies  Allergen Reactions   Nsaids Other (See Comments)    No NSAIDS due to kidney function- ESPECIALLY ibuprofen  or Aleve   Biaxin [Clarithromycin] Hives   Statins Other (See Comments)    Muscle pain (myositis per daugher)   Bactrim [Sulfamethoxazole-Trimethoprim] Hives and Rash   Lisinopril Cough   Sulfa Antibiotics Hives, Rash and Other (See Comments)    NO sulfa-based meds!!     Family History  Problem Relation Age of Onset   Diabetes Mother    Hypertension Mother    Asthma Father    Diabetes Sister    Stomach cancer Sister    Diabetes  Brother    Allergic rhinitis Neg Hx    Eczema Neg Hx    Urticaria Neg Hx       Prior to Admission medications   Medication Sig Start Date End Date Taking? Authorizing Provider  acetaminophen (TYLENOL) 650 MG CR tablet Take 650 mg by mouth every 8 (eight) hours as needed for pain.   Yes [provider]  BAYER LOW DOSE 81 MG EC tablet Take 81 mg by mouth in the morning. Swallow whole.   Yes [provider]  cetirizine (ZYRTEC) 10 MG tablet Take 10 mg by mouth in the morning, at noon, and at bedtime.   Yes [provider]  Cholecalciferol (VITAMIN D-3 PO) Take 2,000 Units by mouth daily.   Yes [provider]  clopidogrel (PLAVIX) 75 MG tablet Take 1 tablet (75 mg total) by mouth daily with breakfast. 02/09/23  Yes Baglia, Corrina, PA-C  collagenase (SANTYL) 250 UNIT/GM ointment Apply 1 Application topically 2 (two) times a week. Applied to affected areas of toes/feet.   Yes [provider]  ezetimibe (ZETIA) 10 MG tablet Take 1 tablet (10 mg total) by mouth daily. 02/09/23 02/09/24 Yes Chuck Hint, MD  Famotidine (PEPCID AC PO) Take 20 mg by mouth at bedtime.   Yes [provider]  ferrous sulfate 220 (44 Fe) MG/5ML solution Take 330 mg by mouth 3 (three) times a week. 12/27/22  Yes [provider]  folic acid (FOLVITE) 1 MG tablet Take 1 mg by mouth at bedtime.   Yes [provider]  insulin aspart (NOVOLOG FLEXPEN) 100 UNIT/ML FlexPen Inject 2-10 Units into the skin See admin instructions. Inject 2-10 units into the skin three times a day with meals, per sliding scale:  Breakfast: BGL 80-199 = 8 units; 200-299 = 9 units; 300 or greater = 10 units Lunch: BGL 80-199 = 5 units; 200-299 = 6 units; 300 or greater = 7 units Supper/evening meal: BGL 80-199 = 2 units; 200-299 = 3 units; 300 or greater = 4 units   Yes [provider]  insulin detemir (LEVEMIR FLEXTOUCH) 100 UNIT/ML FlexPen Inject 3-7 Units into the  skin See admin instructions. Inject 7 units into the skin in the morning and 3 units at bedtime   Yes [provider]  levETIRAcetam (KEPPRA) 500 MG tablet Take 500 mg by mouth 2 (two) times daily. 01/11/23  Yes [provider]  levothyroxine (SYNTHROID) 100 MCG tablet Take 1 tablet (100 mcg total) by mouth daily before breakfast. 09/12/22 02/03/25 Yes Jerald Kief, MD  liver oil-zinc oxide (DESITIN) 40 % ointment Apply topically 3 (three) times daily. Patient taking differently: Apply 1 Application topically as needed for irritation. 09/16/21  Yes Rolly Salter, MD  loperamide (IMODIUM A-D) 2 MG tablet Take 2 mg by mouth 4 (four) times daily as needed for diarrhea or loose stools.   Yes [provider]  methotrexate (RHEUMATREX) 2.5 MG tablet Take 1 tablet (2.5 mg  total) by mouth once a week. Caution:Chemotherapy. Protect from light. Patient taking differently: Take 12.5 mg by mouth once a week. Caution:Chemotherapy. Protect from light. 01/27/22  Yes Marinda Elk, MD  metoprolol tartrate (LOPRESSOR) 25 MG tablet Take 1 tablet (25 mg total) by mouth 2 (two) times daily. Patient taking differently: Take 12.5 mg by mouth as needed (high blood pressure). 01/20/22  Yes Marinda Elk, MD  midodrine (PROAMATINE) 5 MG tablet Take 0.5 tablets (2.5 mg total) by mouth 3 (three) times daily with meals. Patient taking differently: Take 2.5 mg by mouth 3 (three) times daily as needed (low blood pressure). 10/05/22  Yes Osvaldo Shipper, MD  Multiple Vitamin (MULTIVITAMIN WITH MINERALS) TABS tablet Take 1 tablet by mouth in the morning.   Yes [provider]  PLAQUENIL 200 MG tablet Take 200 mg by mouth at bedtime. 10/07/22  Yes [provider]  polyethylene glycol (MIRALAX / GLYCOLAX) 17 g packet Take 17 g by mouth daily. Patient taking differently: Take 17 g by mouth daily as needed for mild constipation. 09/16/21  Yes Rolly Salter, MD  predniSONE  (DELTASONE) 5 MG tablet Take 5 mg by mouth daily with breakfast.   Yes [provider]  triamcinolone ointment (KENALOG) 0.1 % Apply 1 Application topically 2 (two) times daily as needed (skin irritation). 07/16/22  Yes [provider]    Physical Exam: BP (!) 150/55 (BP Location: Left Arm)   Pulse (!) 102   Temp 98.2 F (36.8 C)   Resp 16   SpO2 98%   General: 89 y.o. year-old female frail-appearing in no acute distress.  Somnolent but is arousable to voice. Cardiovascular: Regular rate and rhythm with no rubs or gallops.  No thyromegaly or JVD noted.  No lower extremity edema. 2/4 pulses in all 4 extremities. Respiratory: Mild rales at bases.  No wheezing noted.  Poor inspiratory effort. Abdomen: Soft nontender nondistended with normal bowel sounds x4 quadrants. Muskuloskeletal: No cyanosis, clubbing or edema noted bilaterally Neuro: CN II-XII intact, strength, sensation, reflexes Skin: No ulcerative lesions noted or rashes Psychiatry: Mood is appropriate for condition and setting          Labs on Admission:  Basic Metabolic Panel: Recent Labs  Lab 02/08/23 0911 02/09/23 0054 02/14/23 2232  NA 129* 128* 126*  K 4.1 4.3 4.0  CL 93* 96* 92*  CO2  --  26 26  GLUCOSE 248* 315* 227*  BUN 17 16 22   CREATININE 1.00 0.82 0.72  CALCIUM  --  8.9 9.1   Liver Function Tests: Recent Labs  Lab 02/14/23 2232  AST 25  ALT 20  ALKPHOS 74  BILITOT 0.4  PROT 6.4*  ALBUMIN 3.1*   No results for input(s): "LIPASE", "AMYLASE" in the last 168 hours. Recent Labs  Lab 02/14/23 2232  AMMONIA <10   CBC: Recent Labs  Lab 02/08/23 0911 02/14/23 2232  WBC  --  5.6  NEUTROABS  --  4.5  HGB 10.9* 8.8*  HCT 32.0* 26.7*  MCV  --  99.6  PLT  --  277   Cardiac Enzymes: No results for input(s): "CKTOTAL", "CKMB", "CKMBINDEX", "TROPONINI" in the last 168 hours.  BNP (last 3 results) Recent Labs    09/03/22 1230  BNP 123.0*    ProBNP (last 3 results) No  results for input(s): "PROBNP" in the last 8760 hours.  CBG: Recent Labs  Lab 02/08/23 1748 02/08/23 2008 02/09/23 0543  GLUCAP 269* 235* 222*  Radiological Exams on Admission: DG Chest Port 1 View  Result Date: 02/14/2023 CLINICAL DATA:  AMS EXAM: PORTABLE CHEST 1 VIEW COMPARISON:  Chest x-ray 01/04/2023 FINDINGS: Correlate cardiac device overlying the left chest The heart and mediastinal contours are within normal limits. Aortic calcification. Biapical pleural/pulmonary scarring. Patchy airspace opacity within the left lower lobe. No pulmonary edema. No pleural effusion. No pneumothorax. No acute osseous abnormality. IMPRESSION: 1. Patchy airspace opacity within the left lower lobe. Followup PA and lateral chest X-ray is recommended in 3-4 weeks following therapy to ensure resolution and exclude underlying malignancy. 2.  Aortic Atherosclerosis (ICD10-I70.0). Electronically Signed   By: Tish Frederickson M.D.   On: 02/14/2023 23:02   CT HEAD WO CONTRAST ( )  Result Date: 02/14/2023 CLINICAL DATA:  Seizure and altered mental status EXAM: CT HEAD WITHOUT CONTRAST TECHNIQUE: Contiguous axial images were obtained from the base of the skull through the vertex without intravenous contrast. RADIATION DOSE REDUCTION: This exam was performed according to the departmental dose-optimization program which includes automated exposure control, adjustment of the mA and/or kV according to patient size and/or use of iterative reconstruction technique. COMPARISON:  None Available. FINDINGS: Brain: There is no mass, hemorrhage or extra-axial collection. The size and configuration of the ventricles and extra-axial CSF spaces are normal. There is hypoattenuation of the white matter, most commonly indicating chronic small vessel disease. Vascular: No abnormal hyperdensity of the major intracranial arteries or dural venous sinuses. No intracranial atherosclerosis. Skull: The visualized skull base, calvarium and  extracranial soft tissues are normal. Sinuses/Orbits: No fluid levels or advanced mucosal thickening of the visualized paranasal sinuses. No mastoid or middle ear effusion. The orbits are normal. IMPRESSION: Chronic small vessel disease without acute intracranial abnormality. Electronically Signed   By: Deatra Robinson M.D.   On: 02/14/2023 22:44    EKG: I independently viewed the EKG done and my findings are as followed: Sinus rhythm rate of 71.  Nonspecific ST-T changes.  QTc 480.  Assessment/Plan Present on Admission:  AMS (altered mental status)  Active Problems:   AMS (altered mental status)  Acute metabolic encephalopathy suspect multifactorial secondary to left lower lobe pneumonia rule out UTI Treat underlying condition Reorient as needed  Left lower lobe pneumonia, POA History of recurrent aspiration pneumonia Continue IV antibiotics started in the ED On IV Unasyn and IV Levaquin Obtain baseline procalcitonin Monitor fever curve and WBCs  Resolved acute hypoxic respiratory failure Appears to be saturating well on room air at this time Bronchodilators Maintain O2 saturation above 92%.  Eczema/urticaria Resume home regimen  Moderate protein calorie malnutrition BMI 21, abdomen 3.1 on 02/14/2023. Encourage oral intake as tolerated  Nausea and vomiting x 1 Supportive care Antiemetics Gentle IV fluid hydration  History of dysphagia Speech therapist evaluation At home on dysphagia 3 diet and thickened liquids N.p.o. until passes a bedside swallow evaluation.  History of pansensitive Enterococcus UTI Follow urine analysis and urine culture  Pressure wounds Wheelchair-bound Wound care specialist consulted Continue local wound care Overlay mattress    Time: 75 minutes.    DVT prophylaxis: Subcu Lovenox daily  Code Status: DNR stated by her daughters at bedside.  Family Communication: Updated patient's daughters x 2 at bedside.  Disposition Plan: Admitted  to progressive care unit  Consults called: None  Admission status: Inpatient status.    Status is: Inpatient The patient requires at least 2 midnight for further evaluation and treatment of present condition.   Darlin Drop MD [facility] Pager [phone]  If 7PM-7AM, please contact night-coverage [email or web address] Password [email or web address]  02/15/2023, 1:24 AM

## 2023-02-15 NOTE — Progress Notes (Signed)
Pharmacy Antibiotic Note  [name] [name] is a 89 y.o. female admitted on 02/14/2023 with  history of dementia, chronic kidney disease, GERD, history of seizures, hypertension, hypothyroidism, peripheral arterial disease, presenting to the ED for altered mental status.  .  Pharmacy has been consulted to dose unasyn Hx of eneterococcous faecalis UTI  Plan: Unasyn 1.5gm IV q6h Follow renal function, cultures and clinical course     Temp (24hrs), Avg:98.2 F (36.8 C), Min:98.2 F (36.8 C), Max:98.2 F (36.8 C)  Recent Labs  Lab 02/08/23 0911 02/09/23 0054 02/14/23 2232  WBC  --   --  5.6  CREATININE 1.00 0.82 0.72    Estimated Creatinine Clearance: 43.6 mL/min (by C-G formula based on SCr of 0.72 mg/dL).    Allergies  Allergen Reactions   Nsaids Other (See Comments)    No NSAIDS due to kidney function- ESPECIALLY ibuprofen or Aleve   Biaxin [Clarithromycin] Hives   Statins Other (See Comments)    Muscle pain (myositis per daugher)   Bactrim [Sulfamethoxazole-Trimethoprim] Hives and Rash   Lisinopril Cough   Sulfa Antibiotics Hives, Rash and Other (See Comments)    NO sulfa-based meds!!      Thank you for allowing pharmacy to be a part of this patient's care.  Arley Phenix RPh 02/15/2023, 1:20 AM

## 2023-02-15 NOTE — ED Notes (Signed)
Repositioned pt on to right side, pillows placed behind pt and blankets provided

## 2023-02-15 NOTE — ED Notes (Signed)
Pt was wet and had L.bm. pt has been changed as well as purewick put in place. Pt linens were also changed and has been turned on left side per family request.

## 2023-02-15 NOTE — Progress Notes (Signed)
SLP Cancellation Note  Patient Details Name: [name] [name] MRN: [id number] DOB: Jun 18, 1934   Cancelled treatment:       Reason Eval/Treat Not Completed: Patient's level of consciousness SLP spoke with patient's daughter [name] [name], who had stepped out of patient's room. She indicated that patient is currently not alert enough to even try PO's. Patient is known to SLP service as she has been hospitalized multiple times since 2021 and has known dysphagia. SLP reviewed with [name] [name] that unfortunately, patient's swallow function has declined with MBS in February of 2023 indicating mild oropharyngeal dysphagia with no aspiration, October 2023 MBS indicating worsening swallow function with silent aspiration and pharyngeal retention of boluses and January 2024 MBS indicating moderate to severe oropharyngeal dysphagia with decreased opening of PES. Patient was most recently seen by SLP in February of 2024 and SLP notes indicate that family opted for comfort PO's with known aspiration risk. [name] [name] reported that family declined Hospice back in February. She also stated that patient had been doing well since last week. She did indicate that patient coughs with drinking but not like "I'm choking" kind of cough. [name] [name] went on to say that patient has been more lethargic since vascular surgery on Monday. SLP briefly discussed that a repeat MBS would likely either show that her swallow function is unchanged since January, or that it has worsened. As patient is not awake/alert for any PO's at this time, SLP recommending continue NPO status and will f/u later this date if patient's alertness changes.   Angela Nevin, MA, CCC-SLP Speech Therapy

## 2023-02-15 NOTE — Consult Note (Signed)
WOC Nurse Consult Note: Reason for Consult: LE wounds, pressure injuries Wound type: ischemic ulceration related to PAD on the Left foot and the right great toe Patient underwent balloon angioplasty 02/08/23 and has been followed outpatient by [facility].  Followed as well by the [facility] for the last seven weeks. Left great toe has healed, last seen 7/12 per Dr. Mikey Bussing  Pressure Injury POA: NA Measurement: see nursing flow sheets  Left medial foot wound reported to be 0.2cm c 0.2cm x 0.1cm at last [facility] visit. Small open area on the right great toe.  Wound [id number] Drainage (amount, consistency, odor) see nursing flowsheets Periwound:  intact Dressing procedure/placement/frequency: Continue with POC established outpatient, enzymatic debridement ointment daily, cover with foam or dry dressing.   *using hydroferra blue outpatient, this is non inpatient formulary item. Will substitute silicone foam.   Patient to continue to FU with [facility] and [facility] as scheduled. (08/02 and 09/05 respectively)   Re consult if needed, will not follow at this time. Thanks  Liara Holm [employer], RN,CWOCN, CNS, CWON-AP [phone])

## 2023-02-15 NOTE — Progress Notes (Signed)
PROGRESS NOTE    [name] [name]  OZD:[id number] DOB: 09-01-1933 DOA: 02/14/2023 PCP: [name], MD   Brief Narrative: 89 year old with past medical history significant for eczema, urticaria, seizure disorder, diabetes type 2, hyperlipidemia, hypertension, hypothyroidism, peripheral vascular disease, dysphagia, history of recurrent aspiration pneumonia, pansensitive Enterococcus UTI who presented with altered mental status, associated nausea and vomiting.  Reports of occasional cough.  Patient had a vascular procedure on Monday since then she has been more sleepy, generally weak care.  Patient recently started on Plavix and ezetimibe.  Patient at baseline is wheelchair-bound and is able to assist with transferring at baseline but today she was a lot of weaker. On EMS evaluation patient was noted to be hypoxic oxygen saturation in the 80s, she was placed on a nonrebreather.  She was also hypotensive and received fluids and routes.  Patient admitted with altered mental status acute metabolic encephalopathy in the setting of pneumonia and UTI.Marland Kitchen    Assessment & Plan:   Active Problems:   PNA (pneumonia)   Acute metabolic encephalopathy   Hyponatremia   History of seizure   Insulin dependent type 2 diabetes mellitus (HCC)   Hypertension with hx of orthostatic hypotension   Hypothyroidism, acquired   GERD (gastroesophageal reflux disease)   PAD (peripheral artery disease) (HCC)   AMS (altered mental status)   1-Acute Metabolic Encephalopathy Multifactorial in the setting of infection pneumonia:  -Ammonia less than 10, TSH 4.3.  -Support care.  -She continue to be sleepy.  -CT head: Chronic small vessel disease without acute intracranial abnormality.  -Check B 12, Thiamine. Start High dose thiamine. PCo2 not significantly elevated.   2-Left Lower Lobe Pneumonia:  History of recurrent aspiration pneumonia Chest X ray: Patchy airspace opacity within the left lower lobe. Followup PA  and lateral chest X-ray is recommended in 3-4 weeks following -MRSA Negative,  -Speech consulted.  -Continue with IV Unasyn.   3-Resolved Acute hypoxic respiratory failure: -In setting of PNA. Oxygen sat 80 % on evaluation by EMS>  -Support care, treatment of PNA. -  4-Moderate protein caloric malnutrition -start supplement when she tolerates diet.   5-History of dysphagia -speech consulted.   6-History of pansensitive Enterococcus UTI -Follow UA, Urine culture.   7-Episode of nausea and vomiting: -Monitor.  -PRN compazine.  -check KUB:   8-Eczema/Urticaria: On Triamcinolone.   9-DM type 2: Hyperglycemia;  She is NPO, unable to start diet to AMS.  Start with SSI. Depending on CBG could consider Low dose levemir 7/30  PVD; recent procedure 1 week ago. Continue with plavix.  Hyponatremia; Continue with IV fluids.  Hypomagnesemia; Replete IV>  Hypothyroidism: Continue with Synthroid.  History Seizure; Continue with Keppra.  Pressure wound, wheelchair-bound Wound care consulted.   Dermatomyositis; on low dose prednisone.          Estimated body mass index is 21.46 kg/m as calculated from the following:   Height as of 02/08/23: 5\' 4"  (1.626 m).   Weight as of 02/08/23: 56.7 kg.   DVT prophylaxis: Lovenox Code Status: DNR Family Communication: Daughter at bedside.  Disposition Plan:  Status is: Inpatient Remains inpatient appropriate because: management of encephalopathy     Consultants:    Procedures:    Antimicrobials:    Subjective: Sleepy, only open eyes to voice, fall back to sleep  Objective: Vitals:   02/15/23 0354 02/15/23 0400 02/15/23 0455 02/15/23 0630  BP: 114/65 (!) 151/104  (!) 129/51  Pulse:  77  77  Resp: 19 (!)  27  (!) 21  Temp:   97.8 F (36.6 C)   TempSrc:   Oral   SpO2:  100%  99%   No intake or output data in the 24 hours ending 02/15/23 0730 There were no vitals filed for this visit.  Examination:  General exam:  Appears calm and comfortable  Respiratory system: Clear to auscultation. Respiratory effort normal. Cardiovascular system: S1 & S2 heard, RRR.  Gastrointestinal system: Abdomen is nondistended, soft and nontender. Central nervous system: sleepy.  Extremities: trace edema,    Data Reviewed: I have personally reviewed following labs and imaging studies  CBC: Recent Labs  Lab 02/08/23 0911 02/14/23 2232 02/15/23 0418  WBC  --  5.6 9.4  NEUTROABS  --  4.5  --   HGB 10.9* 8.8* 8.6*  HCT 32.0* 26.7* 25.5*  MCV  --  99.6 98.1  PLT  --  277 291   Basic Metabolic Panel: Recent Labs  Lab 02/08/23 0911 02/09/23 0054 02/14/23 2232 02/15/23 0418  NA 129* 128* 126* 127*  K 4.1 4.3 4.0 4.1  CL 93* 96* 92* 93*  CO2  --  26 26 26   GLUCOSE 248* 315* 227* 276*  BUN 17 16 22 19   CREATININE 1.00 0.82 0.72 0.78  CALCIUM  --  8.9 9.1 9.0  MG  --   --   --  1.6*  PHOS  --   --   --  2.9   GFR: Estimated Creatinine Clearance: 43.6 mL/min (by C-G formula based on SCr of 0.78 mg/dL). Liver Function Tests: Recent Labs  Lab 02/14/23 2232 02/15/23 0418  AST 25 27  ALT 20 20  ALKPHOS 74 69  BILITOT 0.4 0.5  PROT 6.4* 6.3*  ALBUMIN 3.1* 3.0*   No results for input(s): "LIPASE", "AMYLASE" in the last 168 hours. Recent Labs  Lab 02/14/23 2232  AMMONIA <10   Coagulation Profile: No results for input(s): "INR", "PROTIME" in the last 168 hours. Cardiac Enzymes: No results for input(s): "CKTOTAL", "CKMB", "CKMBINDEX", "TROPONINI" in the last 168 hours. BNP (last 3 results) No results for input(s): "PROBNP" in the last 8760 hours. HbA1C: No results for input(s): "HGBA1C" in the last 72 hours. CBG: Recent Labs  Lab 02/08/23 1748 02/08/23 2008 02/09/23 0543  GLUCAP 269* 235* 222*   Lipid Profile: No results for input(s): "CHOL", "HDL", "LDLCALC", "TRIG", "CHOLHDL", "LDLDIRECT" in the last 72 hours. Thyroid Function Tests: Recent Labs    02/15/23 0041 02/15/23 0042  TSH  4.336  --   FREET4  --  1.16*   Anemia Panel: No results for input(s): "VITAMINB12", "FOLATE", "FERRITIN", "TIBC", "IRON", "RETICCTPCT" in the last 72 hours. Sepsis Labs: No results for input(s): "PROCALCITON", "LATICACIDVEN" in the last 168 hours.  Recent Results (from the past 240 hour(s))  Resp panel by RT-PCR (RSV, Flu A&B, Covid) Anterior Nasal Swab     Status: None   Collection Time: 02/14/23 11:07 PM   Specimen: Anterior Nasal Swab  Result Value Ref Range Status   SARS Coronavirus 2 by RT PCR NEGATIVE NEGATIVE Final    Comment: (NOTE) SARS-CoV-2 target nucleic acids are NOT DETECTED.  The SARS-CoV-2 RNA is generally detectable in upper respiratory specimens during the acute phase of infection. The lowest concentration of SARS-CoV-2 viral copies this assay can detect is 138 copies/mL. A negative result does not preclude SARS-Cov-2 infection and should not be used as the sole basis for treatment or other patient management decisions. A negative result may occur with  improper specimen collection/handling, submission of specimen other than nasopharyngeal swab, presence of viral mutation(s) within the areas targeted by this assay, and inadequate number of viral copies(<138 copies/mL). A negative result must be combined with clinical observations, patient history, and epidemiological information. The expected result is Negative.  Fact Sheet for Patients:  BloggerCourse.com  Fact Sheet for Healthcare Providers:  SeriousBroker.it  This test is no t yet approved or cleared by the [place] FDA and  has been authorized for detection and/or diagnosis of SARS-CoV-2 by FDA under an Emergency Use Authorization (EUA). This EUA will remain  in effect (meaning this test can be used) for the duration of the COVID-19 declaration under Section 564(b)(1) of the Act, 21 U.S.C.section 360bbb-3(b)(1), unless the authorization is terminated   or revoked sooner.       Influenza A by PCR NEGATIVE NEGATIVE Final   Influenza B by PCR NEGATIVE NEGATIVE Final    Comment: (NOTE) The Xpert Xpress SARS-CoV-2/FLU/RSV plus assay is intended as an aid in the diagnosis of influenza from Nasopharyngeal swab specimens and should not be used as a sole basis for treatment. Nasal washings and aspirates are unacceptable for Xpert Xpress SARS-CoV-2/FLU/RSV testing.  Fact Sheet for Patients: BloggerCourse.com  Fact Sheet for Healthcare Providers: SeriousBroker.it  This test is not yet approved or cleared by the [place] FDA and has been authorized for detection and/or diagnosis of SARS-CoV-2 by FDA under an Emergency Use Authorization (EUA). This EUA will remain in effect (meaning this test can be used) for the duration of the COVID-19 declaration under Section 564(b)(1) of the Act, 21 U.S.C. section 360bbb-3(b)(1), unless the authorization is terminated or revoked.     Resp Syncytial Virus by PCR NEGATIVE NEGATIVE Final    Comment: (NOTE) Fact Sheet for Patients: BloggerCourse.com  Fact Sheet for Healthcare Providers: SeriousBroker.it  This test is not yet approved or cleared by the [place] FDA and has been authorized for detection and/or diagnosis of SARS-CoV-2 by FDA under an Emergency Use Authorization (EUA). This EUA will remain in effect (meaning this test can be used) for the duration of the COVID-19 declaration under Section 564(b)(1) of the Act, 21 U.S.C. section 360bbb-3(b)(1), unless the authorization is terminated or revoked.  Performed at [facility], [address], [place], [place] [address]   MRSA Next Gen by PCR, Nasal     Status: None   Collection Time: 02/15/23  1:13 AM   Specimen: Nasal Mucosa; Nasal Swab  Result Value Ref Range Status   MRSA by PCR Next Gen NOT DETECTED NOT  DETECTED Final    Comment: (NOTE) The GeneXpert MRSA Assay (FDA approved for NASAL specimens only), is one component of a comprehensive MRSA colonization surveillance program. It is not intended to diagnose MRSA infection nor to guide or monitor treatment for MRSA infections. Test performance is not FDA approved in patients less than 55 years old. Performed at [facility], [address], [place], [place] [address]          Radiology Studies: DG Chest Port 1 View  Result Date: 02/14/2023 CLINICAL DATA:  AMS EXAM: PORTABLE CHEST 1 VIEW COMPARISON:  Chest x-ray 01/04/2023 FINDINGS: Correlate cardiac device overlying the left chest The heart and mediastinal contours are within normal limits. Aortic calcification. Biapical pleural/pulmonary scarring. Patchy airspace opacity within the left lower lobe. No pulmonary edema. No pleural effusion. No pneumothorax. No acute osseous abnormality. IMPRESSION: 1. Patchy airspace opacity within the left lower lobe. Followup PA  and lateral chest X-ray is recommended in 3-4 weeks following therapy to ensure resolution and exclude underlying malignancy. 2.  Aortic Atherosclerosis (ICD10-I70.0). Electronically Signed   By: Tish Frederickson M.D.   On: 02/14/2023 23:02   CT HEAD WO CONTRAST ( )  Result Date: 02/14/2023 CLINICAL DATA:  Seizure and altered mental status EXAM: CT HEAD WITHOUT CONTRAST TECHNIQUE: Contiguous axial images were obtained from the base of the skull through the vertex without intravenous contrast. RADIATION DOSE REDUCTION: This exam was performed according to the departmental dose-optimization program which includes automated exposure control, adjustment of the mA and/or kV according to patient size and/or use of iterative reconstruction technique. COMPARISON:  None Available. FINDINGS: Brain: There is no mass, hemorrhage or extra-axial collection. The size and configuration of the ventricles and extra-axial CSF spaces  are normal. There is hypoattenuation of the white matter, most commonly indicating chronic small vessel disease. Vascular: No abnormal hyperdensity of the major intracranial arteries or dural venous sinuses. No intracranial atherosclerosis. Skull: The visualized skull base, calvarium and extracranial soft tissues are normal. Sinuses/Orbits: No fluid levels or advanced mucosal thickening of the visualized paranasal sinuses. No mastoid or middle ear effusion. The orbits are normal. IMPRESSION: Chronic small vessel disease without acute intracranial abnormality. Electronically Signed   By: Deatra Robinson M.D.   On: 02/14/2023 22:44        Scheduled Meds:  cetirizine HCl  5 mg Oral Once   clopidogrel  75 mg Oral Q breakfast   enoxaparin (LOVENOX) injection  40 mg Subcutaneous Q24H   ezetimibe  10 mg Oral Daily   ferrous sulfate  300 mg Oral Once per day on Monday Wednesday Friday   folic acid  1 mg Oral QHS   hydroxychloroquine  200 mg Oral QHS   levothyroxine  100 mcg Oral Q0600   multivitamin with minerals  1 tablet Oral Daily   predniSONE  5 mg Oral Q breakfast   Continuous Infusions:  sodium chloride     ampicillin-sulbactam (UNASYN) IV Stopped (02/15/23 0243)   levETIRAcetam Stopped (02/15/23 0244)   magnesium sulfate bolus IVPB       LOS: 0 days    Time spent: 35 minutes    [name] [name] A [name] Meikle, MD [facility]   If 7PM-7AM, please contact night-coverage [email or web address]  02/15/2023, 7:30 AM

## 2023-02-15 NOTE — ED Notes (Signed)
ED TO INPATIENT HANDOFF REPORT  Name/Age/Gender [name] [name] 89 y.o. female  Code Status    Code Status Orders  (From admission, onward)           Start     Ordered   02/15/23 0121  Do not attempt resuscitation (DNR)  Continuous       Question Answer Comment  If patient has no pulse and is not breathing Do Not Attempt Resuscitation   If patient has a pulse and/or is breathing: Medical Treatment Goals LIMITED ADDITIONAL INTERVENTIONS: Use medication/IV fluids and cardiac monitoring as indicated; Do not use intubation or mechanical ventilation (DNI), also provide comfort medications.  Transfer to Progressive/Stepdown as indicated, avoid Intensive Care.   Consent: Discussion documented in EHR or advanced directives reviewed      02/15/23 0120           Code Status History     Date Active Date Inactive Code Status Order ID Comments User Context   02/08/2023 1730 02/09/2023 1537 Full Code [id number]  Chuck Hint, MD Inpatient   10/02/2022 1929 10/05/2022 1859 DNR [id number]  Orland Mustard, MD ED   09/10/2022 1409 09/11/2022 2127 DNR [id number]  Emeline General, MD ED   07/27/2022 2317 07/30/2022 2004 DNR [id number]  Charlsie Quest, MD ED   05/09/2022 2153 05/13/2022 2041 Full Code [id number]  Darlin Drop, DO ED   01/13/2022 1556 01/20/2022 1935 DNR [id number]  Teddy Spike, DO Inpatient   09/13/2021 1921 09/16/2021 1758 DNR [id number]  Rolly Salter, MD ED   09/13/2021 1901 09/13/2021 1921 Full Code [id number]  Rolly Salter, MD ED   10/08/2019 1040 10/12/2019 1639 Full Code [id number]  Arline Asp, NP ED   08/21/2019 1118 08/23/2019 2332 Full Code [id number]  Clydie Braun, MD ED   08/18/2019 0602 08/20/2019 1748 Full Code [id number]  John Giovanni, MD Inpatient   11/26/2016 0048 12/09/2016 1943 Full Code [id number]  Alberteen Sam, MD Inpatient   06/10/2016 1355 06/11/2016 1537 Full Code [id number]  [facility]       Home/SNF/Other Home  Chief  Complaint AMS (altered mental status) [R41.82]  Level of Care/Admitting Diagnosis ED Disposition     ED Disposition  Admit   Condition  --   Comment  Hospital Area: [facility] [[id number]]  Level of Care: Progressive [102]  Admit to Progressive based on following criteria: NEUROLOGICAL AND NEUROSURGICAL complex patients with significant risk of instability, who do not meet ICU criteria, yet require close observation or frequent assessment (< / = every 2 - 4 hours) with medical / nursing intervention.  Admit to Progressive based on following criteria: GI, ENDOCRINE disease patients with GI bleeding, acute liver failure or pancreatitis, stable with diabetic ketoacidosis or thyrotoxicosis (hypothyroid) state.  May admit patient to [facility] or [facility] if equivalent level of care is available:: Yes  Covid Evaluation: Asymptomatic - no recent exposure (last 10 days) testing not required  Diagnosis: AMS (altered mental status) [[id number]]  Admitting Physician: Darlin Drop [[id number]]  Attending Physician: Darlin Drop [[id number]]  Certification:: I certify this patient will need inpatient services for at least 2 midnights  Estimated Length of Stay: 2          Medical History Past Medical History:  Diagnosis Date   Arthritis    "knees, legs" (06/10/2016)   Ascites    Chronic kidney disease    "related to my diabetes"  Chronic lower back pain    Diabetes mellitus without complication (HCC)    Eczema    GERD (gastroesophageal reflux disease)    High cholesterol    Hyperlipidemia    Hypertension    Hypothyroidism    OAB (overactive bladder)    Osteoporosis    Thyroid disease    Type II diabetes mellitus (HCC)    Urticaria     Allergies Allergies  Allergen Reactions   Nsaids Other (See Comments)    No NSAIDS due to kidney function- ESPECIALLY ibuprofen or Aleve   Biaxin [Clarithromycin] Hives   Statins Other (See Comments)    Muscle pain (myositis  per daugher)   Bactrim [Sulfamethoxazole-Trimethoprim] Hives and Rash   Lisinopril Cough   Sulfa Antibiotics Hives, Rash and Other (See Comments)    NO sulfa-based meds!!     IV Location/Drains/Wounds Patient Lines/Drains/Airways Status     Active Line/Drains/Airways     Name Placement date Placement time Site Days   Peripheral IV 02/15/23 20 G 1" Anterior;Proximal;Right Forearm 02/15/23  0000  Forearm  less than 1   Peripheral IV 02/15/23 18 G Anterior;Left;Proximal Forearm 02/15/23  0011  Forearm  less than 1   Pressure Injury 10/02/22 Coccyx Medial Stage 2 -  Partial thickness loss of dermis presenting as a shallow open injury with a red, pink wound bed without slough. 10/02/22  2055  -- 136   Pressure Injury 10/02/22 Heel Left;Right Stage 1 -  Intact skin with non-blanchable redness of a localized area usually over a bony prominence. Bilateral heel redness, prophylactic drsg applied, heels floating 10/02/22  2055  -- 136   Pressure Injury 10/02/22 Knee Anterior;Left Stage 2 -  Partial thickness loss of dermis presenting as a shallow open injury with a red, pink wound bed without slough. 2 open areas above left knee 10/02/22  2055  -- 136   Pressure Injury 10/02/22 Toe (Comment  which one) Anterior;Right Stage 1 -  Intact skin with non-blanchable redness of a localized area usually over a bony prominence. right bunion redness 10/02/22  2055  -- 136   Pressure Injury 10/02/22 Back Left;Upper Stage 1 -  Intact skin with non-blanchable redness of a localized area usually over a bony prominence. erythema to bony prominence of left shoulder blade 10/02/22  2055  -- 136   Wound / Incision (Open or Dehisced) 09/10/22 Toe (Comment  which one) Anterior;Right sore on tip of big toe 09/10/22  1725  Toe (Comment  which one)  158   Wound / Incision (Open or Dehisced) 09/10/22 Toe (Comment  which one) Anterior;Left Wound on tip of big toe 09/10/22  1725  Toe (Comment  which one)  158   Wound / Incision  (Open or Dehisced) 09/10/22 Foot Anterior;Left "bunion" per family, with open area 09/10/22  1725  Foot  158   Wound / Incision (Open or Dehisced) 09/10/22 Coccyx 09/10/22  1725  Coccyx  158            Labs/Imaging Results for orders placed or performed during the hospital encounter of 02/14/23 (from the past 48 hour(s))  CBC with Differential     Status: Abnormal   Collection Time: 02/14/23 10:32 PM  Result Value Ref Range   WBC 5.6 4.0 - 10.5 K/uL   RBC 2.68 (L) 3.87 - 5.11 MIL/uL   Hemoglobin 8.8 (L) 12.0 - 15.0 g/dL   HCT 40.9 (L) 81.1 - 91.4 %   MCV 99.6 80.0 - 100.0 fL  MCH 32.8 26.0 - 34.0 pg   MCHC 33.0 30.0 - 36.0 g/dL   RDW 40.9 81.1 - 91.4 %   Platelets 277 150 - 400 K/uL   nRBC 0.0 0.0 - 0.2 %   Neutrophils Relative % 81 %   Neutro Abs 4.5 1.7 - 7.7 K/uL   Lymphocytes Relative 9 %   Lymphs Abs 0.5 (L) 0.7 - 4.0 K/uL   Monocytes Relative 6 %   Monocytes Absolute 0.3 0.1 - 1.0 K/uL   Eosinophils Relative 3 %   Eosinophils Absolute 0.2 0.0 - 0.5 K/uL   Basophils Relative 0 %   Basophils Absolute 0.0 0.0 - 0.1 K/uL   Immature Granulocytes 1 %   Abs Immature Granulocytes 0.04 0.00 - 0.07 K/uL    Comment: Performed at [facility], [address] [place], [place] [address]  Comprehensive metabolic panel     Status: Abnormal   Collection Time: 02/14/23 10:32 PM  Result Value Ref Range   Sodium 126 (L) 135 - 145 mmol/L   Potassium 4.0 3.5 - 5.1 mmol/L   Chloride 92 (L) 98 - 111 mmol/L   CO2 26 22 - 32 mmol/L   Glucose, Bld 227 (H) 70 - 99 mg/dL    Comment: Glucose reference range applies only to samples taken after fasting for at least 8 hours.   BUN 22 8 - 23 mg/dL   Creatinine, Ser 6.21 0.44 - 1.00 mg/dL   Calcium 9.1 8.9 - 30.8 mg/dL   Total Protein 6.4 (L) 6.5 - 8.1 g/dL   Albumin 3.1 (L) 3.5 - 5.0 g/dL   AST 25 15 - 41 U/L   ALT 20 0 - 44 U/L   Alkaline Phosphatase 74 38 - 126 U/L   Total Bilirubin 0.4 0.3 - 1.2 mg/dL   GFR,  Estimated >65 >78 mL/min    Comment: (NOTE) Calculated using the CKD-EPI Creatinine Equation (2021)    Anion gap 8 5 - 15    Comment: Performed at [facility], [address] [place], [place] [address]  Ethanol     Status: None   Collection Time: 02/14/23 10:32 PM  Result Value Ref Range   Alcohol, Ethyl (B) <10 <10 mg/dL    Comment: (NOTE) Lowest detectable limit for serum alcohol is 10 mg/dL.  For medical purposes only. Performed at [facility], [address], [place], [place] [address]   Ammonia     Status: None   Collection Time: 02/14/23 10:32 PM  Result Value Ref Range   Ammonia <10 9 - 35 umol/L    Comment: Performed at [facility], [address], [place], [place] [address]  Resp panel by RT-PCR (RSV, Flu A&B, Covid) Anterior Nasal Swab     Status: None   Collection Time: 02/14/23 11:07 PM   Specimen: Anterior Nasal Swab  Result Value Ref Range   SARS Coronavirus 2 by RT PCR NEGATIVE NEGATIVE    Comment: (NOTE) SARS-CoV-2 target nucleic acids are NOT DETECTED.  The SARS-CoV-2 RNA is generally detectable in upper respiratory specimens during the acute phase of infection. The lowest concentration of SARS-CoV-2 viral copies this assay can detect is 138 copies/mL. A negative result does not preclude SARS-Cov-2 infection and should not be used as the sole basis for treatment or other patient management decisions. A negative result may occur with  improper specimen collection/handling, submission of specimen other than nasopharyngeal swab, presence of viral mutation(s) within the areas  targeted by this assay, and inadequate number of viral copies(<138 copies/mL). A negative result must be combined with clinical observations, patient history, and epidemiological information. The expected result is Negative.  Fact Sheet for Patients:  BloggerCourse.com  Fact Sheet for Healthcare  Providers:  SeriousBroker.it  This test is no t yet approved or cleared by the [place] FDA and  has been authorized for detection and/or diagnosis of SARS-CoV-2 by FDA under an Emergency Use Authorization (EUA). This EUA will remain  in effect (meaning this test can be used) for the duration of the COVID-19 declaration under Section 564(b)(1) of the Act, 21 U.S.C.section 360bbb-3(b)(1), unless the authorization is terminated  or revoked sooner.       Influenza A by PCR NEGATIVE NEGATIVE   Influenza B by PCR NEGATIVE NEGATIVE    Comment: (NOTE) The Xpert Xpress SARS-CoV-2/FLU/RSV plus assay is intended as an aid in the diagnosis of influenza from Nasopharyngeal swab specimens and should not be used as a sole basis for treatment. Nasal washings and aspirates are unacceptable for Xpert Xpress SARS-CoV-2/FLU/RSV testing.  Fact Sheet for Patients: BloggerCourse.com  Fact Sheet for Healthcare Providers: SeriousBroker.it  This test is not yet approved or cleared by the [place] FDA and has been authorized for detection and/or diagnosis of SARS-CoV-2 by FDA under an Emergency Use Authorization (EUA). This EUA will remain in effect (meaning this test can be used) for the duration of the COVID-19 declaration under Section 564(b)(1) of the Act, 21 U.S.C. section 360bbb-3(b)(1), unless the authorization is terminated or revoked.     Resp Syncytial Virus by PCR NEGATIVE NEGATIVE    Comment: (NOTE) Fact Sheet for Patients: BloggerCourse.com  Fact Sheet for Healthcare Providers: SeriousBroker.it  This test is not yet approved or cleared by the [place] FDA and has been authorized for detection and/or diagnosis of SARS-CoV-2 by FDA under an Emergency Use Authorization (EUA). This EUA will remain in effect (meaning this test can be used) for the  duration of the COVID-19 declaration under Section 564(b)(1) of the Act, 21 U.S.C. section 360bbb-3(b)(1), unless the authorization is terminated or revoked.  Performed at [facility], [address], [place], [place] [address]   TSH     Status: None   Collection Time: 02/15/23 12:41 AM  Result Value Ref Range   TSH 4.336 0.350 - 4.500 uIU/mL    Comment: Performed by a 3rd Generation assay with a functional sensitivity of <=0.01 uIU/mL. Performed at [facility], [address] [place], [place] [address]   T4, free     Status: Abnormal   Collection Time: 02/15/23 12:42 AM  Result Value Ref Range   Free T4 1.16 (H) 0.61 - 1.12 ng/dL    Comment: (NOTE) Biotin ingestion may interfere with free T4 tests. If the results are inconsistent with the TSH level, previous test results, or the clinical presentation, then consider biotin interference. If needed, order repeat testing after stopping biotin. Performed at [facility], [address] [place], [place] [address]   MRSA Next Gen by PCR, Nasal     Status: None   Collection Time: 02/15/23  1:13 AM   Specimen: Nasal Mucosa; Nasal Swab  Result Value Ref Range   MRSA by PCR Next Gen NOT DETECTED NOT DETECTED    Comment: (NOTE) The GeneXpert MRSA Assay (FDA approved for NASAL specimens only), is one component of a comprehensive MRSA colonization surveillance program. It is not intended to diagnose MRSA infection  nor to guide or monitor treatment for MRSA infections. Test performance is not FDA approved in patients less than 80 years old. Performed at [facility], [address], [place], [place] [address]   Blood gas, venous     Status: Abnormal   Collection Time: 02/15/23  1:50 AM  Result Value Ref Range   pH, Ven 7.37 7.25 - 7.43   pCO2, Ven 42 (L) 44 - 60 mmHg   pO2, Ven 36 32 - 45 mmHg   Bicarbonate 24.9 20.0 - 28.0 mmol/L   Acid-base deficit 0.8 0.0 - 2.0  mmol/L   O2 Saturation 64.3 %   Patient temperature 36.1     Comment: Performed at [facility], [address], [place], [place] [address]  CBC     Status: Abnormal   Collection Time: 02/15/23  4:18 AM  Result Value Ref Range   WBC 9.4 4.0 - 10.5 K/uL   RBC 2.60 (L) 3.87 - 5.11 MIL/uL   Hemoglobin 8.6 (L) 12.0 - 15.0 g/dL   HCT 03.5 (L) 00.9 - 38.1 %   MCV 98.1 80.0 - 100.0 fL   MCH 33.1 26.0 - 34.0 pg   MCHC 33.7 30.0 - 36.0 g/dL   RDW 82.9 93.7 - 16.9 %   Platelets 291 150 - 400 K/uL   nRBC 0.0 0.0 - 0.2 %    Comment: Performed at [facility], [address] [place], [place] [address]  Comprehensive metabolic panel     Status: Abnormal   Collection Time: 02/15/23  4:18 AM  Result Value Ref Range   Sodium 127 (L) 135 - 145 mmol/L   Potassium 4.1 3.5 - 5.1 mmol/L   Chloride 93 (L) 98 - 111 mmol/L   CO2 26 22 - 32 mmol/L   Glucose, Bld 276 (H) 70 - 99 mg/dL    Comment: Glucose reference range applies only to samples taken after fasting for at least 8 hours.   BUN 19 8 - 23 mg/dL   Creatinine, Ser 8.10 0.44 - 1.00 mg/dL   Calcium 9.0 8.9 - 17.5 mg/dL   Total Protein 6.3 (L) 6.5 - 8.1 g/dL   Albumin 3.0 (L) 3.5 - 5.0 g/dL   AST 27 15 - 41 U/L   ALT 20 0 - 44 U/L   Alkaline Phosphatase 69 38 - 126 U/L   Total Bilirubin 0.5 0.3 - 1.2 mg/dL   GFR, Estimated >10 >25 mL/min    Comment: (NOTE) Calculated using the CKD-EPI Creatinine Equation (2021)    Anion gap 8 5 - 15    Comment: Performed at [facility], [address], [place], [place] [address]  Magnesium     Status: Abnormal   Collection Time: 02/15/23  4:18 AM  Result Value Ref Range   Magnesium 1.6 (L) 1.7 - 2.4 mg/dL    Comment: Performed at [facility], [address], [place], [place] [address]  Phosphorus     Status: None   Collection Time: 02/15/23  4:18 AM  Result Value Ref Range   Phosphorus 2.9 2.5 - 4.6 mg/dL    Comment:  Performed at [facility], [address], [place], [place] [address]  Reticulocytes     Status: Abnormal   Collection Time: 02/15/23  4:18 AM  Result Value Ref Range   Retic Ct Pct 2.2 0.4 - 3.1 %   RBC. 2.63 (L) 3.87 - 5.11 MIL/uL   Retic  Count, Absolute 58.1 19.0 - 186.0 K/uL   Immature Retic Fract 9.6 2.3 - 15.9 %    Comment: Performed at [facility], [address], [place], [place] [address]  CBG monitoring, ED     Status: Abnormal   Collection Time: 02/15/23  8:06 AM  Result Value Ref Range   Glucose-Capillary 370 (H) 70 - 99 mg/dL    Comment: Glucose reference range applies only to samples taken after fasting for at least 8 hours.  Vitamin B12     Status: Abnormal   Collection Time: 02/15/23 10:27 AM  Result Value Ref Range   Vitamin B-12 1,299 (H) 180 - 914 pg/mL    Comment: (NOTE) This assay is not validated for testing neonatal or myeloproliferative syndrome specimens for Vitamin B12 levels. Performed at [facility], [address] [place], [place] [address]   Folate     Status: None   Collection Time: 02/15/23 10:27 AM  Result Value Ref Range   Folate 11.4 >5.9 ng/mL    Comment: Performed at [facility], [address] [place], [place] [address]  Iron and TIBC     Status: Abnormal   Collection Time: 02/15/23 10:27 AM  Result Value Ref Range   Iron 14 (L) 28 - 170 ug/dL   TIBC 914 (L) 782 - 956 ug/dL   Saturation Ratios 7 (L) 10.4 - 31.8 %   UIBC 195 ug/dL    Comment: Performed at [facility], [address], [place], [place] [address]  Ferritin     Status: Abnormal   Collection Time: 02/15/23 10:27 AM  Result Value Ref Range   Ferritin 312 (H) 11 - 307 ng/mL    Comment: Performed at [facility], [address], [place], [place] [address]  CBG monitoring, ED     Status: Abnormal   Collection Time: 02/15/23 11:32 AM  Result Value Ref Range    Glucose-Capillary 262 (H) 70 - 99 mg/dL    Comment: Glucose reference range applies only to samples taken after fasting for at least 8 hours.   DG Chest Port 1 View  Result Date: 02/14/2023 CLINICAL DATA:  AMS EXAM: PORTABLE CHEST 1 VIEW COMPARISON:  Chest x-ray 01/04/2023 FINDINGS: Correlate cardiac device overlying the left chest The heart and mediastinal contours are within normal limits. Aortic calcification. Biapical pleural/pulmonary scarring. Patchy airspace opacity within the left lower lobe. No pulmonary edema. No pleural effusion. No pneumothorax. No acute osseous abnormality. IMPRESSION: 1. Patchy airspace opacity within the left lower lobe. Followup PA and lateral chest X-ray is recommended in 3-4 weeks following therapy to ensure resolution and exclude underlying malignancy. 2.  Aortic Atherosclerosis (ICD10-I70.0). Electronically Signed   By: Tish Frederickson M.D.   On: 02/14/2023 23:02   CT HEAD WO CONTRAST ( )  Result Date: 02/14/2023 CLINICAL DATA:  Seizure and altered mental status EXAM: CT HEAD WITHOUT CONTRAST TECHNIQUE: Contiguous axial images were obtained from the base of the skull through the vertex without intravenous contrast. RADIATION DOSE REDUCTION: This exam was performed according to the departmental dose-optimization program which includes automated exposure control, adjustment of the mA and/or kV according to patient size and/or use of iterative reconstruction technique. COMPARISON:  None Available. FINDINGS: Brain: There is no mass, hemorrhage or extra-axial collection. The size and configuration of the ventricles and extra-axial CSF spaces are normal. There is hypoattenuation of the white matter, most commonly indicating chronic small vessel disease. Vascular: No abnormal hyperdensity  of the major intracranial arteries or dural venous sinuses. No intracranial atherosclerosis. Skull: The visualized skull base, calvarium and extracranial soft tissues are normal.  Sinuses/Orbits: No fluid levels or advanced mucosal thickening of the visualized paranasal sinuses. No mastoid or middle ear effusion. The orbits are normal. IMPRESSION: Chronic small vessel disease without acute intracranial abnormality. Electronically Signed   By: Deatra Robinson M.D.   On: 02/14/2023 22:44    Pending Labs Unresulted Labs (From admission, onward)     Start     Ordered   02/22/23 0500  Creatinine, serum  (enoxaparin (LOVENOX)    CrCl >/= 30 ml/min)  Weekly,   R     Comments: while on enoxaparin therapy    02/15/23 0120   02/15/23 0827  Vitamin B1  Once,   R        02/15/23 0826   02/14/23 2224  Rapid urine drug screen (hospital performed)  ONCE - STAT,   STAT        02/14/23 2223   02/14/23 2223  Levetiracetam level  Once,   URGENT        02/14/23 2223   02/14/23 2220  Urinalysis, w/ Reflex to Culture (Infection Suspected) -Urine, Clean Catch  Once,   URGENT       Question:  Specimen Source  Answer:  Urine, Clean Catch   02/14/23 2223            Vitals/Pain Today's Vitals   02/15/23 0930 02/15/23 1100 02/15/23 1230 02/15/23 1330  BP: 113/87 (!) 147/59 138/68 (!) 151/73  Pulse: 78 89 84 81  Resp: (!) 23 18 15 16   Temp:   98.7 F (37.1 C)   TempSrc:      SpO2: 100% 100% 100% 100%  PainSc:        Isolation Precautions No active isolations  Medications Medications  enoxaparin (LOVENOX) injection 40 mg (40 mg Subcutaneous Given 02/15/23 1031)  levETIRAcetam (KEPPRA) IVPB 500 mg/100 mL premix (0 mg Intravenous Stopped 02/15/23 1050)  ampicillin-sulbactam (UNASYN) 1.5 g in sodium chloride 0.9 % 100 mL IVPB (0 g Intravenous Stopped 02/15/23 0827)  clopidogrel (PLAVIX) tablet 75 mg (75 mg Oral Not Given 02/15/23 0959)  ezetimibe (ZETIA) tablet 10 mg (10 mg Oral Not Given 02/15/23 1033)  ferrous sulfate 300 (60 Fe) MG/5ML syrup 300 mg (300 mg Oral Not Given 02/15/23 1031)  folic acid (FOLVITE) tablet 1 mg (1 mg Oral Not Given 02/15/23 0127)  levothyroxine (SYNTHROID)  tablet 100 mcg (100 mcg Oral Not Given 02/15/23 0514)  hydroxychloroquine (PLAQUENIL) tablet 200 mg (200 mg Oral Not Given 02/15/23 0126)  multivitamin with minerals tablet 1 tablet (1 tablet Oral Not Given 02/15/23 1033)  predniSONE (DELTASONE) tablet 5 mg (5 mg Oral Not Given 02/15/23 1000)  triamcinolone ointment (KENALOG) 0.1 % 1 Application (has no administration in time range)  acetaminophen (TYLENOL) tablet 650 mg (has no administration in time range)  prochlorperazine (COMPAZINE) injection 5 mg (has no administration in time range)  polyethylene glycol (MIRALAX / GLYCOLAX) packet 17 g (has no administration in time range)  hydrALAZINE (APRESOLINE) injection 5 mg (has no administration in time range)  cetirizine HCl (Zyrtec) 5 MG/5ML solution 5 mg (0 mg Oral Hold 02/15/23 0423)  0.9 %  sodium chloride infusion ( Intravenous New Bag/Given 02/15/23 0753)  insulin aspart (novoLOG) injection 0-6 Units (3 Units Subcutaneous Given 02/15/23 1149)  thiamine (VITAMIN B1) 500 mg in sodium chloride 0.9 % 50 mL IVPB (500 mg Intravenous New Bag/Given  02/15/23 1135)  collagenase (SANTYL) ointment (has no administration in time range)  levofloxacin (LEVAQUIN) IVPB 500 mg (0 mg Intravenous Stopped 02/15/23 0112)  magnesium sulfate IVPB 2 g 50 mL (0 g Intravenous Stopped 02/15/23 1105)    Mobility non-ambulatory

## 2023-02-15 NOTE — ED Notes (Signed)
Request from pt daughter Essie Hart [phone] requesting contact from RN when pt status/updates available. [employer]

## 2023-02-15 NOTE — Inpatient Diabetes Management (Signed)
Inpatient Diabetes Program Recommendations  AACE/ADA: New Consensus Statement on Inpatient Glycemic Control (2015)  Target Ranges:  Prepandial:   less than 140 mg/dL      Peak postprandial:   less than 180 mg/dL (1-2 hours)      Critically ill patients:  140 - 180 mg/dL    Latest Reference Range & Units 02/15/23 08:06 02/15/23 11:32  Glucose-Capillary 70 - 99 mg/dL 409 (H)  5 units Novolog  262 (H)  3 units Novolog   (H): Data is abnormally high     Home DM Meds: Levemir 7 units AM/ 3 units PM      Novolog: Breakfast: BGL 80-199 = 8 units; 200-299 = 9 units; 300 or greater = 10 units       Lunch: BGL 80-199 = 5 units; 200-299 = 6 units; 300 or greater = 7 units-       Supper/evening meal: BGL 80-199 = 2 units; 200-299 = 3 units; 300 or greater = 4 units    Current Orders: Novolog 0-6 units TID (started this AM)     MD- Note CBGs >250 today  Please consider starting home doses of Levemir: 7 units QAM + 3 units at bedtime      --Will follow patient during hospitalization--  Ambrose Finland RN, MSN, CDCES Diabetes Coordinator Inpatient Glycemic Control Team Team Pager: [phone] (8a-5p)

## 2023-02-16 DIAGNOSIS — R4182 Altered mental status, unspecified: Secondary | ICD-10-CM | POA: Diagnosis not present

## 2023-02-16 LAB — CBC
HCT: 25.5 % — ABNORMAL LOW (ref 36.0–46.0)
Hemoglobin: 8.5 g/dL — ABNORMAL LOW (ref 12.0–15.0)
MCH: 32.8 pg (ref 26.0–34.0)
MCHC: 33.3 g/dL (ref 30.0–36.0)
MCV: 98.5 fL (ref 80.0–100.0)
Platelets: 310 10*3/uL (ref 150–400)
RBC: 2.59 MIL/uL — ABNORMAL LOW (ref 3.87–5.11)
RDW: 14 % (ref 11.5–15.5)
WBC: 10.6 10*3/uL — ABNORMAL HIGH (ref 4.0–10.5)
nRBC: 0 % (ref 0.0–0.2)

## 2023-02-16 LAB — BASIC METABOLIC PANEL
Anion gap: 10 (ref 5–15)
BUN: 16 mg/dL (ref 8–23)
CO2: 25 mmol/L (ref 22–32)
Calcium: 9.2 mg/dL (ref 8.9–10.3)
Chloride: 97 mmol/L — ABNORMAL LOW (ref 98–111)
Creatinine, Ser: 0.65 mg/dL (ref 0.44–1.00)
GFR, Estimated: >60 mL/min (ref >60)
Glucose, Bld: 205 mg/dL — ABNORMAL HIGH (ref 70–99)
Potassium: 3.8 mmol/L (ref 3.5–5.1)
Sodium: 132 mmol/L — ABNORMAL LOW (ref 135–145)

## 2023-02-16 LAB — GLUCOSE, CAPILLARY
Glucose-Capillary: 180 mg/dL — ABNORMAL HIGH (ref 70–99)
Glucose-Capillary: 154 mg/dL — ABNORMAL HIGH (ref 70–99)
Glucose-Capillary: 197 mg/dL — ABNORMAL HIGH (ref 70–99)
Glucose-Capillary: 459 mg/dL — ABNORMAL HIGH (ref 70–99)

## 2023-02-16 LAB — MAGNESIUM: Magnesium: 2 mg/dL (ref 1.7–2.4)

## 2023-02-16 MED ORDER — ENSURE ENLIVE PO LIQD
237.0000 mL | Freq: Three times a day (TID) | ORAL | Status: DC
  Administered 2023-02-16 – 2023-02-17 (×4): 237 mL via ORAL

## 2023-02-16 MED ORDER — INSULIN ASPART 100 UNIT/ML IJ SOLN
7.0000 [IU] | Freq: Once | INTRAMUSCULAR | Status: AC
  Administered 2023-02-16: 7 [IU] via SUBCUTANEOUS

## 2023-02-16 NOTE — Progress Notes (Addendum)
PROGRESS NOTE    [name] [name]  ZOX:[id number] DOB: August 23, 1933 DOA: 02/14/2023 PCP: [name], MD   Brief Narrative: 89 year old with past medical history significant for eczema, urticaria, seizure disorder, diabetes type 2, hyperlipidemia, hypertension, hypothyroidism, peripheral vascular disease, dysphagia, history of recurrent aspiration pneumonia, pansensitive Enterococcus UTI who presented with altered mental status, associated nausea and vomiting.  Reports of occasional cough.  Patient had a vascular procedure on Monday since then she has been more sleepy, generally weak care.  Patient recently started on Plavix and ezetimibe.  Patient at baseline is wheelchair-bound and is able to assist with transferring at baseline but today she was a lot of weaker. On EMS evaluation patient was noted to be hypoxic oxygen saturation in the 80s, she was placed on a nonrebreather.  She was also hypotensive and received fluids and routes.  Patient admitted with altered mental status acute metabolic encephalopathy in the setting of pneumonia and Hyponatremia    Assessment & Plan:   Active Problems:   PNA (pneumonia)   Acute metabolic encephalopathy   Hyponatremia   History of seizure   Insulin dependent type 2 diabetes mellitus (HCC)   Hypertension with hx of orthostatic hypotension   Hypothyroidism, acquired   GERD (gastroesophageal reflux disease)   PAD (peripheral artery disease) (HCC)   AMS (altered mental status)   1-Acute Metabolic Encephalopathy Multifactorial in the setting of infection pneumonia:  -Ammonia less than 10, TSH 4.3.  -Support care.  -CT head: Chronic small vessel disease without acute intracranial abnormality.  - B 12,: 1299  Thiamine Pending. Started High dose thiamine. PCo2 not significantly elevated.  -She is more alert, she is answering questions appropriately.  Suspect related to hyponatremia and infection. Improved.   2-Left Lower Lobe Pneumonia:  History  of recurrent aspiration pneumonia Chest X ray: Patchy airspace opacity within the left lower lobe. Followup PA and lateral chest X-ray is recommended in 3-4 weeks following -MRSA Negative,  -Speech consulted. Regular diet, aspiration precaution.  -Continue with IV Unasyn.   3-Resolved Acute hypoxic respiratory failure: -In setting of PNA. Oxygen sat 80 % on evaluation by EMS>  -Support care, treatment of PNA. -  4-Moderate protein caloric malnutrition -start supplement when she tolerates diet.   5-History of dysphagia -speech consulted. Regular diet recommend.   6-History of pansensitive Enterococcus UTI -UA has not been send  7-Episode of nausea and vomiting: -Monitor.  -PRN compazine.  -KUB: non obstructive Bowel.  -no further vomiting.   8-Eczema/Urticaria: On Triamcinolone.   9-DM type 2: Hyperglycemia;  She is NPO, unable to start diet to AMS.  Start with SSI. Depending on CBG could consider Low dose levemir 7/30  Oral thrush: start Statins, on Diflucan PVD; recent procedure 1 week ago. Continue with plavix.  Hyponatremia; Continue with IV fluids. Improved.  Hypomagnesemia; Replaced.  Hypothyroidism: Continue with Synthroid.  History Seizure; Continue with Keppra. Iron deficiency anemia; on Iron supplement.  Pressure wound, wheelchair-bound Wound care consulted.   Dermatomyositis; on low dose prednisone.  Signs/Symptoms: moderate fat depletion, severe muscle depletion    Interventions: Refer to RD note for recommendations  Estimated body mass index is 21.65 kg/m as calculated from the following:   Height as of this encounter: 5\' 4"  (1.626 m).   Weight as of this encounter: 57.2 kg.   DVT prophylaxis: Lovenox Code Status: DNR Family Communication: Daughter at bedside.  Disposition Plan:  Status is: Inpatient Remains inpatient appropriate because: management of encephalopathy     Consultants:  Procedures:    Antimicrobials:     Subjective: She is alert, she answer questions. She denies pain. She had BM yesterday   Objective: Vitals:   02/15/23 2136 02/16/23 0032 02/16/23 0446 02/16/23 1256  BP: (!) 139/51 (!) 133/57 (!) 137/58 117/73  Pulse: 65 62 65 80  Resp: 18 18 17 16   Temp: 98.5 F (36.9 C) 98.2 F (36.8 C) 98.2 F (36.8 C) 98.2 F (36.8 C)  TempSrc: Oral Oral Oral Oral  SpO2: 100% 97% 98% 99%  Weight:      Height:        Intake/Output Summary (Last 24 hours) at 02/16/2023 1558 Last data filed at 02/16/2023 0920 Gross per 24 hour  Intake 702.5 ml  Output 450 ml  Net 252.5 ml   Filed Weights   02/15/23 1625  Weight: 57.2 kg    Examination:  General exam: NAD, thin appearing Respiratory system: CTA Cardiovascular system: S 1, S 2 RRR Gastrointestinal system: BS present, soft,nt Central nervous system: alert, answer questions.  Extremities: trace edema   Data Reviewed: I have personally reviewed following labs and imaging studies  CBC: Recent Labs  Lab 02/14/23 2232 02/15/23 0418 02/16/23 0825  WBC 5.6 9.4 10.6*  NEUTROABS 4.5  --   --   HGB 8.8* 8.6* 8.5*  HCT 26.7* 25.5* 25.5*  MCV 99.6 98.1 98.5  PLT 277 291 310   Basic Metabolic Panel: Recent Labs  Lab 02/14/23 2232 02/15/23 0418 02/16/23 0825  NA 126* 127* 132*  K 4.0 4.1 3.8  CL 92* 93* 97*  CO2 26 26 25   GLUCOSE 227* 276* 205*  BUN 22 19 16   CREATININE 0.72 0.78 0.65  CALCIUM 9.1 9.0 9.2  MG  --  1.6* 2.0  PHOS  --  2.9  --    GFR: Estimated Creatinine Clearance: 43.6 mL/min (by C-G formula based on SCr of 0.65 mg/dL). Liver Function Tests: Recent Labs  Lab 02/14/23 2232 02/15/23 0418  AST 25 27  ALT 20 20  ALKPHOS 74 69  BILITOT 0.4 0.5  PROT 6.4* 6.3*  ALBUMIN 3.1* 3.0*   No results for input(s): "LIPASE", "AMYLASE" in the last 168 hours. Recent Labs  Lab 02/14/23 2232  AMMONIA <10   Coagulation Profile: No results for input(s): "INR", "PROTIME" in the last 168 hours. Cardiac  Enzymes: No results for input(s): "CKTOTAL", "CKMB", "CKMBINDEX", "TROPONINI" in the last 168 hours. BNP (last 3 results) No results for input(s): "PROBNP" in the last 8760 hours. HbA1C: No results for input(s): "HGBA1C" in the last 72 hours. CBG: Recent Labs  Lab 02/15/23 1132 02/15/23 1646 02/15/23 2137 02/16/23 0724 02/16/23 1143  GLUCAP 262* 100* 108* 180* 154*   Lipid Profile: No results for input(s): "CHOL", "HDL", "LDLCALC", "TRIG", "CHOLHDL", "LDLDIRECT" in the last 72 hours. Thyroid Function Tests: Recent Labs    02/15/23 0041 02/15/23 0042  TSH 4.336  --   FREET4  --  1.16*   Anemia Panel: Recent Labs    02/15/23 0418 02/15/23 1027  VITAMINB12  --  1,299*  FOLATE  --  11.4  FERRITIN  --  312*  TIBC  --  209*  IRON  --  14*  RETICCTPCT 2.2  --    Sepsis Labs: No results for input(s): "PROCALCITON", "LATICACIDVEN" in the last 168 hours.  Recent Results (from the past 240 hour(s))  Resp panel by RT-PCR (RSV, Flu A&B, Covid) Anterior Nasal Swab     Status: None   Collection Time: 02/14/23  11:07 PM   Specimen: Anterior Nasal Swab  Result Value Ref Range Status   SARS Coronavirus 2 by RT PCR NEGATIVE NEGATIVE Final    Comment: (NOTE) SARS-CoV-2 target nucleic acids are NOT DETECTED.  The SARS-CoV-2 RNA is generally detectable in upper respiratory specimens during the acute phase of infection. The lowest concentration of SARS-CoV-2 viral copies this assay can detect is 138 copies/mL. A negative result does not preclude SARS-Cov-2 infection and should not be used as the sole basis for treatment or other patient management decisions. A negative result may occur with  improper specimen collection/handling, submission of specimen other than nasopharyngeal swab, presence of viral mutation(s) within the areas targeted by this assay, and inadequate number of viral copies(<138 copies/mL). A negative result must be combined with clinical observations, patient  history, and epidemiological information. The expected result is Negative.  Fact Sheet for Patients:  BloggerCourse.com  Fact Sheet for Healthcare Providers:  SeriousBroker.it  This test is no t yet approved or cleared by the [place] FDA and  has been authorized for detection and/or diagnosis of SARS-CoV-2 by FDA under an Emergency Use Authorization (EUA). This EUA will remain  in effect (meaning this test can be used) for the duration of the COVID-19 declaration under Section 564(b)(1) of the Act, 21 U.S.C.section 360bbb-3(b)(1), unless the authorization is terminated  or revoked sooner.       Influenza A by PCR NEGATIVE NEGATIVE Final   Influenza B by PCR NEGATIVE NEGATIVE Final    Comment: (NOTE) The Xpert Xpress SARS-CoV-2/FLU/RSV plus assay is intended as an aid in the diagnosis of influenza from Nasopharyngeal swab specimens and should not be used as a sole basis for treatment. Nasal washings and aspirates are unacceptable for Xpert Xpress SARS-CoV-2/FLU/RSV testing.  Fact Sheet for Patients: BloggerCourse.com  Fact Sheet for Healthcare Providers: SeriousBroker.it  This test is not yet approved or cleared by the [place] FDA and has been authorized for detection and/or diagnosis of SARS-CoV-2 by FDA under an Emergency Use Authorization (EUA). This EUA will remain in effect (meaning this test can be used) for the duration of the COVID-19 declaration under Section 564(b)(1) of the Act, 21 U.S.C. section 360bbb-3(b)(1), unless the authorization is terminated or revoked.     Resp Syncytial Virus by PCR NEGATIVE NEGATIVE Final    Comment: (NOTE) Fact Sheet for Patients: BloggerCourse.com  Fact Sheet for Healthcare Providers: SeriousBroker.it  This test is not yet approved or cleared by the [place] FDA  and has been authorized for detection and/or diagnosis of SARS-CoV-2 by FDA under an Emergency Use Authorization (EUA). This EUA will remain in effect (meaning this test can be used) for the duration of the COVID-19 declaration under Section 564(b)(1) of the Act, 21 U.S.C. section 360bbb-3(b)(1), unless the authorization is terminated or revoked.  Performed at [facility], [address], [place], [place] [address]   MRSA Next Gen by PCR, Nasal     Status: None   Collection Time: 02/15/23  1:13 AM   Specimen: Nasal Mucosa; Nasal Swab  Result Value Ref Range Status   MRSA by PCR Next Gen NOT DETECTED NOT DETECTED Final    Comment: (NOTE) The GeneXpert MRSA Assay (FDA approved for NASAL specimens only), is one component of a comprehensive MRSA colonization surveillance program. It is not intended to diagnose MRSA infection nor to guide or monitor treatment for MRSA infections. Test performance is not FDA approved in patients less than 59 years old. Performed at  [facility], [address], [place], [place] [address]          Radiology Studies: DG Abd 1 View  Result Date: 02/15/2023 CLINICAL DATA:  [id number] Nausea AND vomiting [id number] EXAM: ABDOMEN - 1 VIEW COMPARISON:  09/15/2021. FINDINGS: The bowel gas pattern is non-obstructive. No evidence of pneumoperitoneum, within the limitations of a supine film. No acute osseous abnormalities. There is old fracture deformity of the pubic symphysis and bilateral pubic rami. The soft tissues are within normal limits. Surgical changes, devices, tubes and lines: Bilateral common iliac artery stents noted. IMPRESSION: *Nonobstructive bowel gas pattern. Electronically Signed   By: Jules Schick M.D.   On: 02/15/2023 16:42   DG Chest Port 1 View  Result Date: 02/14/2023 CLINICAL DATA:  AMS EXAM: PORTABLE CHEST 1 VIEW COMPARISON:  Chest x-ray 01/04/2023 FINDINGS: Correlate cardiac device overlying the left  chest The heart and mediastinal contours are within normal limits. Aortic calcification. Biapical pleural/pulmonary scarring. Patchy airspace opacity within the left lower lobe. No pulmonary edema. No pleural effusion. No pneumothorax. No acute osseous abnormality. IMPRESSION: 1. Patchy airspace opacity within the left lower lobe. Followup PA and lateral chest X-ray is recommended in 3-4 weeks following therapy to ensure resolution and exclude underlying malignancy. 2.  Aortic Atherosclerosis (ICD10-I70.0). Electronically Signed   By: Tish Frederickson M.D.   On: 02/14/2023 23:02   CT HEAD WO CONTRAST ( )  Result Date: 02/14/2023 CLINICAL DATA:  Seizure and altered mental status EXAM: CT HEAD WITHOUT CONTRAST TECHNIQUE: Contiguous axial images were obtained from the base of the skull through the vertex without intravenous contrast. RADIATION DOSE REDUCTION: This exam was performed according to the departmental dose-optimization program which includes automated exposure control, adjustment of the mA and/or kV according to patient size and/or use of iterative reconstruction technique. COMPARISON:  None Available. FINDINGS: Brain: There is no mass, hemorrhage or extra-axial collection. The size and configuration of the ventricles and extra-axial CSF spaces are normal. There is hypoattenuation of the white matter, most commonly indicating chronic small vessel disease. Vascular: No abnormal hyperdensity of the major intracranial arteries or dural venous sinuses. No intracranial atherosclerosis. Skull: The visualized skull base, calvarium and extracranial soft tissues are normal. Sinuses/Orbits: No fluid levels or advanced mucosal thickening of the visualized paranasal sinuses. No mastoid or middle ear effusion. The orbits are normal. IMPRESSION: Chronic small vessel disease without acute intracranial abnormality. Electronically Signed   By: Deatra Robinson M.D.   On: 02/14/2023 22:44        Scheduled Meds:   cetirizine HCl  5 mg Oral Once   clopidogrel  75 mg Oral Q breakfast   collagenase   Topical Daily   enoxaparin (LOVENOX) injection  40 mg Subcutaneous Q24H   ezetimibe  10 mg Oral Daily   feeding supplement  237 mL Oral TID BM   ferrous sulfate  300 mg Oral Once per day on Monday Wednesday Friday   folic acid  1 mg Oral QHS   hydroxychloroquine  200 mg Oral QHS   insulin aspart  0-6 Units Subcutaneous TID WC   levothyroxine  100 mcg Oral Q0600   multivitamin with minerals  1 tablet Oral Daily   predniSONE  5 mg Oral Q breakfast   Continuous Infusions:  sodium chloride 75 mL/hr at 02/16/23 0552   ampicillin-sulbactam (UNASYN) IV 1.5 g (02/16/23 1343)   fluconazole (DIFLUCAN) IV 200 mg (02/15/23 2029)   levETIRAcetam 500 mg (02/16/23 1013)   thiamine (VITAMIN B1)  injection 500 mg (02/16/23 1059)     LOS: 1 day    Time spent: 35 minutes    Amery Minasyan A Johnmark Geiger, MD [facility]   If 7PM-7AM, please contact night-coverage [email or web address]  02/16/2023, 3:58 PM

## 2023-02-16 NOTE — Progress Notes (Signed)
Initial Nutrition Assessment  DOCUMENTATION CODES:   Non-severe (moderate) malnutrition in context of chronic illness  INTERVENTION:   Once diet advanced: -Ensure Plus High Protein po TID, each supplement provides 350 kcal and 20 grams of protein.  -Multivitamin with minerals daily  NUTRITION DIAGNOSIS:   Moderate Malnutrition related to chronic illness, dysphagia (dementia) as evidenced by moderate fat depletion, severe muscle depletion.  GOAL:   Patient will meet greater than or equal to 90% of their needs  MONITOR:   Labs, Weight trends, I & O's, Diet advancement  REASON FOR ASSESSMENT:   Malnutrition Screening Tool    ASSESSMENT:   89 year old with past medical history significant for eczema, urticaria, seizure disorder, diabetes type 2, hyperlipidemia, hypertension, hypothyroidism, peripheral vascular disease, dysphagia, history of recurrent aspiration pneumonia, pansensitive Enterococcus UTI who presented with altered mental status, associated nausea and vomiting. Patient admitted with altered mental status acute metabolic encephalopathy in the setting of pneumonia and UTI.  Patient in room, HOH. Pt with dementia. Family present to assist with history. Family reports pt has been struggling with dysphagia since January, she was placed on a dysphagia 3 diet which worked for her. She cannot chew most meats unless ground up. Pt consumes homemade protein shakes, Premier Protein or Boost if blood sugars are controlled.  Pt currently NPO, awaiting speech evaluation. Will monitor if diet is advanced. Pt would like strawberry Ensure once diet is advanced.   Pt weighed on bed scale and weight was 111 lbs (50.5 kg). Pt's family states she was 113 lbs but still up from where she was, ~100 lbs earlier this year.   Medications: Ferrous sulfate, folic acid, Multivitamin with minerals daily, Thiamine  Labs reviewed: CBGs: 100-370 Low Na  Low iron  NUTRITION - FOCUSED PHYSICAL  EXAM:  Flowsheet Row Most Recent Value  Orbital Region Moderate depletion  Upper Arm Region Severe depletion  Thoracic and Lumbar Region Unable to assess  Buccal Region Moderate depletion  Temple Region Severe depletion  Clavicle Bone Region Severe depletion  Clavicle and Acromion Bone Region Severe depletion  Scapular Bone Region Severe depletion  Dorsal Hand Severe depletion  Patellar Region Severe depletion  Anterior Thigh Region Severe depletion  Posterior Calf Region Severe depletion  Edema (RD Assessment) None  Hair Unable to assess  [covered]  Eyes Reviewed  Mouth Reviewed  Skin Reviewed  Nails Reviewed       Diet Order:   Diet Order             Diet NPO time specified Except for: Sips with Meds  Diet effective now                   EDUCATION NEEDS:   No education needs have been identified at this time  Skin:  Skin Assessment: Reviewed RN Assessment  Last BM:  7/29  Height:   Ht Readings from Last 1 Encounters:  02/15/23 5\' 4"  (1.626 m)    Weight:   Wt Readings from Last 1 Encounters:  02/15/23 57.2 kg    BMI:  Body mass index is 21.65 kg/m.  Estimated Nutritional Needs:   Kcal:  1500-1700  Protein:  75-85g  Fluid:  1.7L/day   Tilda Franco, MS, RD, LDN Inpatient Clinical Dietitian Contact information available via Amion

## 2023-02-16 NOTE — Progress Notes (Signed)
   02/16/23 1537  SLP Assessment  Clinical Impression Statement (ACUTE ONLY) Patient well-known to this SLP from prior visits.  SLP set up oral suction and had patient utilize after patient brushed her tongue.  Daughter helped to clean patient's dentures and patient was able to place some further p.o. trials today she was fully alert and able to feed herself at times using hand overhand assist.  Mild facial asymmetry on the left noted but patient also turned toward the left.  Minimal prolonged mastication and oral transiting solids with patient benefiting from applesauce or ice cream to help orally transit masticated foods.  No clinical indication of aspiration across all p.o. Fortunately patient has been tolerating p.o. intake evidenced by all of her prior chest x-rays this year being negative.  Recommend return to regular diet to allow family to order foods that patient enjoys and that she can tolerate.  They advised that they would order all of the patient's meals.  Advised they avoid hard meats given patient's oral deficits.  Daughter reports patient does cough sometimes with liquids at home and they do use pured, etc. to help clear solids into his throat.  They said it took them a long time to get the patient to swallow her foods before using the liquids to wash them down.  No SLP follow-up indicated as patient's family has been managing patient's dysphagia beautifully.  Thanks for this consult  SLP Visit Diagnosis Dysphagia, oropharyngeal phase (R13.12)  Impact on safety and function Mild aspiration risk  Other Related Risk Factors History of dysphagia;History of pneumonia;History of GERD;Cognitive impairment;Prolonged intubation;Deconditioning  Swallow Evaluation Recommendations  SLP Diet Recommendations Regular;Thin liquid  Liquid Administration via Cup;Straw  Medication Administration Crushed with puree  Supervision Full supervision/cueing for compensatory strategies  Compensations Slow  rate;Small sips/bites;Other (Comment) (use of puree to help orally transit foods into pharynx)  Postural Changes Seated upright at 90 degrees;Remain upright for at least 30 minutes after po intake  Treatment Plan  Oral Care Recommendations Oral care BID  Caregiver Recommendations Have oral suction available  Treatment Recommendations No treatment recommended at this time  Follow Up Recommendations No SLP follow up  Functional Status Assessment Patient has had a recent decline in their functional status and demonstrates the ability to make significant improvements in function in a reasonable and predictable amount of time.  Individuals Consulted  Consulted and Agree with Results and Recommendations Patient;Family member/caregiver  Family Member Consulted daughter  Report Sent to  Referring physician  SLP Time Calculation  SLP Start Time (ACUTE ONLY) 1405  SLP Stop Time (ACUTE ONLY) 1445  SLP Time Calculation (min) (ACUTE ONLY) 40 min  SLP Evaluations  $ SLP Speech Visit 1 Visit  SLP Evaluations  $BSS Swallow 1 Procedure  $Swallowing Treatment 1 Procedure   Rolena Infante, MS [facility] SLP Acute Rehab Services Office [phone]

## 2023-02-16 NOTE — Plan of Care (Signed)
  Problem: Activity: Goal: Ability to return to baseline activity level will improve Outcome: Progressing   Problem: Cardiovascular: Goal: Ability to achieve and maintain adequate cardiovascular perfusion will improve Outcome: Progressing   Problem: Health Behavior/Discharge Planning: Goal: Ability to safely manage health-related needs after discharge will improve Outcome: Progressing   Problem: Education: Goal: Ability to describe self-care measures that may prevent or decrease complications (Diabetes Survival Skills Education) will improve Outcome: Progressing   Problem: Coping: Goal: Ability to adjust to condition or change in health will improve Outcome: Progressing   Problem: Nutritional: Goal: Maintenance of adequate nutrition will improve Outcome: Progressing

## 2023-02-16 NOTE — Progress Notes (Signed)
Transition of Care [facility]) - Inpatient Brief Assessment   Patient Details  Name: [name] [name] MRN: [id number] Date of Birth: 1934-05-06  Transition of [facility]) CM/SW Contact:    Coralyn Helling, LCSW Phone Number: 02/16/2023, 11:05 AM   Clinical Narrative: Per last admission notes. Patient has home health RN and family helps. Patient in wheelchair at baseline and can assist with transfers. TOC to follow.   Transition of Care Asessment: Insurance and Status: (P) Insurance coverage has been reviewed Patient has primary care physician: (P) Yes Home environment has been reviewed: (P) Home with family Prior level of function:: (P) Needs assistance, wheelchair at baseline Prior/Current Home Services: (P) Current home services [facility]) Social Determinants of Health Reivew: (P) SDOH reviewed no interventions necessary Readmission risk has been reviewed: (P) Yes Transition of care needs: (P) transition of care needs identified, TOC will continue to follow

## 2023-02-16 NOTE — Plan of Care (Signed)
  Problem: Cardiovascular: Goal: Ability to achieve and maintain adequate cardiovascular perfusion will improve Outcome: Progressing   Problem: Coping: Goal: Ability to adjust to condition or change in health will improve Outcome: Progressing   Problem: Metabolic: Goal: Ability to maintain appropriate glucose levels will improve Outcome: Progressing   Problem: Skin Integrity: Goal: Risk for impaired skin integrity will decrease Outcome: Progressing   Problem: Pain Managment: Goal: General experience of comfort will improve Outcome: Progressing

## 2023-02-17 DIAGNOSIS — G9341 Metabolic encephalopathy: Secondary | ICD-10-CM

## 2023-02-17 DIAGNOSIS — J189 Pneumonia, unspecified organism: Secondary | ICD-10-CM | POA: Diagnosis not present

## 2023-02-17 LAB — GLUCOSE, CAPILLARY
Glucose-Capillary: 368 mg/dL — ABNORMAL HIGH (ref 70–99)
Glucose-Capillary: 395 mg/dL — ABNORMAL HIGH (ref 70–99)

## 2023-02-17 MED ORDER — INSULIN ASPART 100 UNIT/ML IJ SOLN: 0.0000 [IU] | Freq: Every day | INTRAMUSCULAR | Status: DC

## 2023-02-17 MED ORDER — INSULIN ASPART 100 UNIT/ML IJ SOLN
0.0000 [IU] | Freq: Three times a day (TID) | INTRAMUSCULAR | Status: DC
  Administered 2023-02-17: 15 [IU] via SUBCUTANEOUS

## 2023-02-17 MED ORDER — FLUCONAZOLE 150 MG PO TABS: 150.0000 mg | ORAL_TABLET | Freq: Every day | ORAL | 0 refills | Status: AC

## 2023-02-17 MED ORDER — AMOXICILLIN-POT CLAVULANATE 875-125 MG PO TABS: 1.0000 | ORAL_TABLET | Freq: Two times a day (BID) | ORAL | 0 refills | Status: AC

## 2023-02-17 MED ORDER — INSULIN GLARGINE-YFGN 100 UNIT/ML ~~LOC~~ SOLN
10.0000 [IU] | Freq: Every day | SUBCUTANEOUS | Status: DC
  Administered 2023-02-17: 10 [IU] via SUBCUTANEOUS
  Filled 2023-02-17: qty 0.1

## 2023-02-17 MED ORDER — SANTYL 250 UNIT/GM EX OINT: 1.0000 | TOPICAL_OINTMENT | Freq: Every day | CUTANEOUS | 0 refills | Status: DC

## 2023-02-17 NOTE — Plan of Care (Signed)
  Problem: Cardiovascular: Goal: Ability to achieve and maintain adequate cardiovascular perfusion will improve Outcome: Progressing   Problem: Nutritional: Goal: Maintenance of adequate nutrition will improve Outcome: Progressing   Problem: Skin Integrity: Goal: Risk for impaired skin integrity will decrease Outcome: Progressing   Problem: Tissue Perfusion: Goal: Adequacy of tissue perfusion will improve Outcome: Progressing   Problem: Pain Managment: Goal: General experience of comfort will improve Outcome: Progressing   Problem: Safety: Goal: Ability to remain free from injury will improve Outcome: Progressing

## 2023-02-17 NOTE — Plan of Care (Signed)
  Problem: Education: Goal: Understanding of CV disease, CV risk reduction, and recovery process will improve Outcome: Adequate for Discharge Goal: Individualized Educational Video(s) Outcome: Adequate for Discharge   Problem: Activity: Goal: Ability to return to baseline activity level will improve Outcome: Adequate for Discharge   Problem: Cardiovascular: Goal: Ability to achieve and maintain adequate cardiovascular perfusion will improve Outcome: Adequate for Discharge Goal: Vascular access site(s) Level 0-1 will be maintained Outcome: Adequate for Discharge   Problem: Health Behavior/Discharge Planning: Goal: Ability to safely manage health-related needs after discharge will improve Outcome: Adequate for Discharge   Problem: Education: Goal: Ability to describe self-care measures that may prevent or decrease complications (Diabetes Survival Skills Education) will improve Outcome: Adequate for Discharge Goal: Individualized Educational Video(s) Outcome: Adequate for Discharge   Problem: Coping: Goal: Ability to adjust to condition or change in health will improve Outcome: Adequate for Discharge   Problem: Fluid Volume: Goal: Ability to maintain a balanced intake and output will improve Outcome: Adequate for Discharge   Problem: Health Behavior/Discharge Planning: Goal: Ability to identify and utilize available resources and services will improve Outcome: Adequate for Discharge Goal: Ability to manage health-related needs will improve Outcome: Adequate for Discharge   Problem: Metabolic: Goal: Ability to maintain appropriate glucose levels will improve Outcome: Adequate for Discharge   Problem: Nutritional: Goal: Maintenance of adequate nutrition will improve Outcome: Adequate for Discharge Goal: Progress toward achieving an optimal weight will improve Outcome: Adequate for Discharge   Problem: Skin Integrity: Goal: Risk for impaired skin integrity will  decrease Outcome: Adequate for Discharge   Problem: Tissue Perfusion: Goal: Adequacy of tissue perfusion will improve Outcome: Adequate for Discharge   Problem: Education: Goal: Knowledge of General Education information will improve Description: Including pain rating scale, medication(s)/side effects and non-pharmacologic comfort measures Outcome: Adequate for Discharge   Problem: Health Behavior/Discharge Planning: Goal: Ability to manage health-related needs will improve Outcome: Adequate for Discharge   Problem: Clinical Measurements: Goal: Ability to maintain clinical measurements within normal limits will improve Outcome: Adequate for Discharge Goal: Will remain free from infection Outcome: Adequate for Discharge Goal: Diagnostic test results will improve Outcome: Adequate for Discharge Goal: Respiratory complications will improve Outcome: Adequate for Discharge Goal: Cardiovascular complication will be avoided Outcome: Adequate for Discharge   Problem: Activity: Goal: Risk for activity intolerance will decrease Outcome: Adequate for Discharge   Problem: Nutrition: Goal: Adequate nutrition will be maintained Outcome: Adequate for Discharge   Problem: Coping: Goal: Level of anxiety will decrease Outcome: Adequate for Discharge   Problem: Elimination: Goal: Will not experience complications related to bowel motility Outcome: Adequate for Discharge Goal: Will not experience complications related to urinary retention Outcome: Adequate for Discharge   Problem: Pain Managment: Goal: General experience of comfort will improve Outcome: Adequate for Discharge   Problem: Safety: Goal: Ability to remain free from injury will improve Outcome: Adequate for Discharge   Problem: Skin Integrity: Goal: Risk for impaired skin integrity will decrease Outcome: Adequate for Discharge   

## 2023-02-17 NOTE — Progress Notes (Signed)
Patient and family provided with discharge education, patient and family verbalized understanding. IV removed.

## 2023-02-17 NOTE — Inpatient Diabetes Management (Signed)
Inpatient Diabetes Program Recommendations  AACE/ADA: New Consensus Statement on Inpatient Glycemic Control (2015)  Target Ranges:  Prepandial:   less than 140 mg/dL      Peak postprandial:   less than 180 mg/dL (1-2 hours)      Critically ill patients:  140 - 180 mg/dL   Lab Results  Component Value Date   GLUCAP 395 (H) 02/17/2023   HGBA1C 8.6 (H) 10/02/2022    Review of Glycemic Control  Latest Reference Range & Units 02/16/23 07:24 02/16/23 11:43 02/16/23 16:28 02/16/23 22:47 02/17/23 07:51 02/17/23 11:24  Glucose-Capillary 70 - 99 mg/dL 401 (H) 027 (H) 253 (H) 459 (H) 368 (H) 395 (H)  (H): Data is abnormally high  Diabetes history: DM2 Outpatient Diabetes medications:  Levemir 7 units QAM, 3 units at bedtime Novolog SSI Current orders for Inpatient glycemic control:  Novolog 0-6 units TID Prednisone 5 mg QAM  Inpatient Diabetes Program Recommendations:    If she is to remain inpatient and on steroids, please consider:  Semglee 10 units every day Novolog 0-15 units TID and 0-5 units QHS  Will continue to follow while inpatient.  Thank you, Dulce Sellar, MSN, CDCES Diabetes Coordinator Inpatient Diabetes Program [phone] (team pager from 8a-5p)

## 2023-02-18 NOTE — Discharge Summary (Signed)
Physician Discharge Summary   NAVLEEN GAYMON ZOX:[id number] DOB: May 19, 1934 DOA: 02/14/2023  PCP: [name], MD  Admit date: 02/14/2023 Discharge date: 02/17/2023   Admitted From: Home Disposition:  Home Discharging physician: Lewie Chamber, MD Barriers to discharge: none  Recommendations at discharge: Repeat CXR at follow up   Discharge Condition: stable Diet recommendation:  Diet Orders (From admission, onward)     Start     Ordered   02/17/23 0000  Diet general        02/17/23 1357            Hospital Course:  Acute Metabolic Encephalopathy Multifactorial in the setting of infection pneumonia- resolved  -CT head: Chronic small vessel disease without acute intracranial abnormality.  - B12,: 1299;  Started High dose thiamine. PCo2 not significantly elevated.  -She is more alert, she is answering questions appropriately.  Suspect related to hyponatremia and infection. Improved.    Left Lower Lobe Pneumonia:  History of recurrent aspiration pneumonia Chest X ray: Patchy airspace opacity within the left lower lobe.  X-ray is recommended in 3-4 weeks following -MRSA Negative -Speech consulted. Regular diet, aspiration precaution.  -Continue with IV Unasyn.    Resolved Acute hypoxic respiratory failure: -In setting of PNA. Oxygen sat 80 % on evaluation by EMS> -Support care, treatment of PNA. - weaned to RA prior to d/c    Moderate protein caloric malnutrition - continue diet     History of dysphagia -speech consulted. Regular diet recommend.    History of pansensitive Enterococcus UTI - no symptoms    Episode of nausea and vomiting -PRN compazine.  -KUB: non obstructive Bowel.  -no further vomiting.    Eczema/Urticaria On Triamcinolone   DM type 2: Hyperglycemia;  - resume home regimen    Oral thrush:on Diflucan PVD; recent procedure 1 week ago. Continue with plavix.  Hyponatremia; Continued with IV fluids. Improved.  Hypomagnesemia; Replaced.   Hypothyroidism: Continue with Synthroid.  History Seizure; Continue with Keppra. Iron deficiency anemia; on Iron supplement.  Pressure wound, wheelchair-bound Wound care consulted.    Dermatomyositis; on low dose prednisone.  Signs/Symptoms: moderate fat depletion, severe muscle depletion   Interventions: Refer to RD note for recommendations   Estimated body mass index is 21.65 kg/m as calculated from the following:   Height as of this encounter: 5\' 4"  (1.626 m).   Weight as of this encounter: 57.2 kg.   The patient's chronic medical conditions were treated accordingly per the patient's home medication regimen except as noted.  On day of discharge, patient was felt deemed stable for discharge. Patient/family member advised to call PCP or come back to ER if needed.   Principal Diagnosis: Acute metabolic encephalopathy  Discharge Diagnoses: Active Hospital Problems   Diagnosis Date Noted   PNA (pneumonia) 01/19/2022    Priority: 1.   Hyponatremia 09/13/2021    Priority: 2.   History of seizure 10/08/2019    Priority: 5.   Insulin dependent type 2 diabetes mellitus (HCC) 01/26/2007    Priority: 7.   Hypertension with hx of orthostatic hypotension 02/14/2007    Priority: 8.   Hypothyroidism, acquired 01/26/2007    Priority: 9.   AMS (altered mental status) 02/15/2023   PAD (peripheral artery disease) (HCC) 10/24/2019   GERD (gastroesophageal reflux disease) 06/10/2016    Resolved Hospital Problems   Diagnosis Date Noted Date Resolved   Acute metabolic encephalopathy 08/21/2019 02/18/2023    Priority: 2.     Discharge Instructions  Diet general   Complete by: As directed    Discharge wound care:   Complete by: As directed    Cleanse both feet/toe wounds with saline or salt water and pat dry. Apply 1/4" layer of Santyl to each wound, top with dry dressing. Change daily.   Increase activity slowly   Complete by: As directed       Allergies as of 02/17/2023        Reactions   Nsaids Other (See Comments)   No NSAIDS due to kidney function- ESPECIALLY ibuprofen or Aleve   Biaxin [clarithromycin] Hives   Statins Other (See Comments)   Muscle pain (myositis per daugher)   Bactrim [sulfamethoxazole-trimethoprim] Hives, Rash   Lisinopril Cough   Sulfa Antibiotics Hives, Rash, Other (See Comments)   NO sulfa-based meds!!        Medication List     TAKE these medications    acetaminophen 650 MG CR tablet Commonly known as: TYLENOL Take 650 mg by mouth every 8 (eight) hours as needed for pain.   amoxicillin-clavulanate 875-125 MG tablet Commonly known as: AUGMENTIN Take 1 tablet by mouth 2 (two) times daily for 4 days.   Bayer Low Dose 81 MG tablet Generic drug: aspirin EC Take 81 mg by mouth in the morning. Swallow whole.   cetirizine 10 MG tablet Commonly known as: ZYRTEC Take 10 mg by mouth in the morning, at noon, and at bedtime.   clopidogrel 75 MG tablet Commonly known as: PLAVIX Take 1 tablet (75 mg total) by mouth daily with breakfast.   collagenase 250 UNIT/GM ointment Commonly known as: SANTYL Apply 1 Application topically 2 (two) times a week. Applied to affected areas of toes/feet. What changed: Another medication with the same name was added. Make sure you understand how and when to take each.   Santyl 250 UNIT/GM ointment Generic drug: collagenase Apply 1 Application topically daily. What changed: You were already taking a medication with the same name, and this prescription was added. Make sure you understand how and when to take each.   ezetimibe 10 MG tablet Commonly known as: Zetia Take 1 tablet (10 mg total) by mouth daily.   ferrous sulfate 220 (44 Fe) MG/5ML solution Take 330 mg by mouth 3 (three) times a week.   fluconazole 150 MG tablet Commonly known as: DIFLUCAN Take 1 tablet (150 mg total) by mouth daily for 5 days.   folic acid 1 MG tablet Commonly known as: FOLVITE Take 1 mg by mouth at  bedtime.   Levemir FlexTouch 100 UNIT/ML FlexTouch Pen Generic drug: insulin detemir Inject 3-7 Units into the skin See admin instructions. Inject 7 units into the skin in the morning and 3 units at bedtime   levETIRAcetam 500 MG tablet Commonly known as: KEPPRA Take 500 mg by mouth 2 (two) times daily.   levothyroxine 100 MCG tablet Commonly known as: SYNTHROID Take 1 tablet (100 mcg total) by mouth daily before breakfast.   liver oil-zinc oxide 40 % ointment Commonly known as: DESITIN Apply topically 3 (three) times daily. What changed:  how much to take when to take this reasons to take this   loperamide 2 MG tablet Commonly known as: IMODIUM A-D Take 2 mg by mouth 4 (four) times daily as needed for diarrhea or loose stools.   methotrexate 2.5 MG tablet Commonly known as: RHEUMATREX Take 1 tablet (2.5 mg total) by mouth once a week. Caution:Chemotherapy. Protect from light. What changed: how much to take  metoprolol tartrate 25 MG tablet Commonly known as: LOPRESSOR Take 1 tablet (25 mg total) by mouth 2 (two) times daily. What changed:  how much to take when to take this reasons to take this   midodrine 5 MG tablet Commonly known as: PROAMATINE Take 0.5 tablets (2.5 mg total) by mouth 3 (three) times daily with meals. What changed:  when to take this reasons to take this   multivitamin with minerals Tabs tablet Take 1 tablet by mouth in the morning.   NovoLOG FlexPen 100 UNIT/ML FlexPen Generic drug: insulin aspart Inject 2-10 Units into the skin See admin instructions. Inject 2-10 units into the skin three times a day with meals, per sliding scale:  Breakfast: BGL 80-199 = 8 units; 200-299 = 9 units; 300 or greater = 10 units Lunch: BGL 80-199 = 5 units; 200-299 = 6 units; 300 or greater = 7 units Supper/evening meal: BGL 80-199 = 2 units; 200-299 = 3 units; 300 or greater = 4 units   PEPCID AC PO Take 20 mg by mouth at bedtime.   Plaquenil 200 MG  tablet Generic drug: hydroxychloroquine Take 200 mg by mouth at bedtime.   polyethylene glycol 17 g packet Commonly known as: MIRALAX / GLYCOLAX Take 17 g by mouth daily. What changed:  when to take this reasons to take this   predniSONE 5 MG tablet Commonly known as: DELTASONE Take 5 mg by mouth daily with breakfast.   triamcinolone ointment 0.1 % Commonly known as: KENALOG Apply 1 Application topically 2 (two) times daily as needed (skin irritation).   VITAMIN D-3 PO Take 2,000 Units by mouth daily.               Discharge Care Instructions  (From admission, onward)           Start     Ordered   02/17/23 0000  Discharge wound care:       Comments: Cleanse both feet/toe wounds with saline or salt water and pat dry. Apply 1/4" layer of Santyl to each wound, top with dry dressing. Change daily.   02/17/23 1357            Allergies  Allergen Reactions   Nsaids Other (See Comments)    No NSAIDS due to kidney function- ESPECIALLY ibuprofen or Aleve   Biaxin [Clarithromycin] Hives   Statins Other (See Comments)    Muscle pain (myositis per daugher)   Bactrim [Sulfamethoxazole-Trimethoprim] Hives and Rash   Lisinopril Cough   Sulfa Antibiotics Hives, Rash and Other (See Comments)    NO sulfa-based meds!!     Consultations:   Procedures:   Discharge Exam: BP (!) 156/58 (BP Location: Right Arm)   Pulse 83   Temp 98.5 F (36.9 C)   Resp 18   Ht 5\' 4"  (1.626 m)   Wt 57.2 kg   SpO2 97%   BMI 21.65 kg/m  Physical Exam Constitutional:      Appearance: Normal appearance.  HENT:     Head: Normocephalic and atraumatic.     Mouth/Throat:     Mouth: Mucous membranes are moist.  Eyes:     Extraocular Movements: Extraocular movements intact.  Cardiovascular:     Rate and Rhythm: Normal rate.  Pulmonary:     Effort: Pulmonary effort is normal. No respiratory distress.  Abdominal:     General: Bowel sounds are normal. There is no distension.      Palpations: Abdomen is soft.     Tenderness: There  is no abdominal tenderness.  Musculoskeletal:        General: Normal range of motion.     Cervical back: Normal range of motion and neck supple.  Skin:    General: Skin is warm and dry.  Neurological:     Mental Status: She is alert. Mental status is at baseline.  Psychiatric:        Mood and Affect: Mood normal.      The results of significant diagnostics from this hospitalization (including imaging, microbiology, ancillary and laboratory) are listed below for reference.   Microbiology: Recent Results (from the past 240 hour(s))  Resp panel by RT-PCR (RSV, Flu A&B, Covid) Anterior Nasal Swab     Status: None   Collection Time: 02/14/23 11:07 PM   Specimen: Anterior Nasal Swab  Result Value Ref Range Status   SARS Coronavirus 2 by RT PCR NEGATIVE NEGATIVE Final    Comment: (NOTE) SARS-CoV-2 target nucleic acids are NOT DETECTED.  The SARS-CoV-2 RNA is generally detectable in upper respiratory specimens during the acute phase of infection. The lowest concentration of SARS-CoV-2 viral copies this assay can detect is 138 copies/mL. A negative result does not preclude SARS-Cov-2 infection and should not be used as the sole basis for treatment or other patient management decisions. A negative result may occur with  improper specimen collection/handling, submission of specimen other than nasopharyngeal swab, presence of viral mutation(s) within the areas targeted by this assay, and inadequate number of viral copies(<138 copies/mL). A negative result must be combined with clinical observations, patient history, and epidemiological information. The expected result is Negative.  Fact Sheet for Patients:  BloggerCourse.com  Fact Sheet for Healthcare Providers:  SeriousBroker.it  This test is no t yet approved or cleared by the [place] FDA and  has been authorized for detection  and/or diagnosis of SARS-CoV-2 by FDA under an Emergency Use Authorization (EUA). This EUA will remain  in effect (meaning this test can be used) for the duration of the COVID-19 declaration under Section 564(b)(1) of the Act, 21 U.S.C.section 360bbb-3(b)(1), unless the authorization is terminated  or revoked sooner.       Influenza A by PCR NEGATIVE NEGATIVE Final   Influenza B by PCR NEGATIVE NEGATIVE Final    Comment: (NOTE) The Xpert Xpress SARS-CoV-2/FLU/RSV plus assay is intended as an aid in the diagnosis of influenza from Nasopharyngeal swab specimens and should not be used as a sole basis for treatment. Nasal washings and aspirates are unacceptable for Xpert Xpress SARS-CoV-2/FLU/RSV testing.  Fact Sheet for Patients: BloggerCourse.com  Fact Sheet for Healthcare Providers: SeriousBroker.it  This test is not yet approved or cleared by the [place] FDA and has been authorized for detection and/or diagnosis of SARS-CoV-2 by FDA under an Emergency Use Authorization (EUA). This EUA will remain in effect (meaning this test can be used) for the duration of the COVID-19 declaration under Section 564(b)(1) of the Act, 21 U.S.C. section 360bbb-3(b)(1), unless the authorization is terminated or revoked.     Resp Syncytial Virus by PCR NEGATIVE NEGATIVE Final    Comment: (NOTE) Fact Sheet for Patients: BloggerCourse.com  Fact Sheet for Healthcare Providers: SeriousBroker.it  This test is not yet approved or cleared by the [place] FDA and has been authorized for detection and/or diagnosis of SARS-CoV-2 by FDA under an Emergency Use Authorization (EUA). This EUA will remain in effect (meaning this test can be used) for the duration of the COVID-19 declaration under Section 564(b)(1) of the Act, 21  U.S.C. section 360bbb-3(b)(1), unless the authorization is terminated  or revoked.  Performed at [facility], [address] [place], [place] [address]   MRSA Next Gen by PCR, Nasal     Status: None   Collection Time: 02/15/23  1:13 AM   Specimen: Nasal Mucosa; Nasal Swab  Result Value Ref Range Status   MRSA by PCR Next Gen NOT DETECTED NOT DETECTED Final    Comment: (NOTE) The GeneXpert MRSA Assay (FDA approved for NASAL specimens only), is one component of a comprehensive MRSA colonization surveillance program. It is not intended to diagnose MRSA infection nor to guide or monitor treatment for MRSA infections. Test performance is not FDA approved in patients less than 38 years old. Performed at [facility], [address] [place], [place] [address]      Labs: BNP (last 3 results) Recent Labs    09/03/22 1230  BNP 123.0*   Basic Metabolic Panel: Recent Labs  Lab 02/14/23 2232 02/15/23 0418 02/16/23 0825 02/17/23 0558  NA 126* 127* 132* 133*  K 4.0 4.1 3.8 3.5  CL 92* 93* 97* 101  CO2 26 26 25 24   GLUCOSE 227* 276* 205* 338*  BUN 22 19 16 14   CREATININE 0.72 0.78 0.65 0.84  CALCIUM 9.1 9.0 9.2 8.6*  MG  --  1.6* 2.0  --   PHOS  --  2.9  --   --    Liver Function Tests: Recent Labs  Lab 02/14/23 2232 02/15/23 0418  AST 25 27  ALT 20 20  ALKPHOS 74 69  BILITOT 0.4 0.5  PROT 6.4* 6.3*  ALBUMIN 3.1* 3.0*   No results for input(s): "LIPASE", "AMYLASE" in the last 168 hours. Recent Labs  Lab 02/14/23 2232  AMMONIA <10   CBC: Recent Labs  Lab 02/14/23 2232 02/15/23 0418 02/16/23 0825 02/17/23 0558  WBC 5.6 9.4 10.6* 8.2  NEUTROABS 4.5  --   --   --   HGB 8.8* 8.6* 8.5* 7.4*  HCT 26.7* 25.5* 25.5* 22.7*  MCV 99.6 98.1 98.5 100.4*  PLT 277 291 310 274   Cardiac Enzymes: No results for input(s): "CKTOTAL", "CKMB", "CKMBINDEX", "TROPONINI" in the last 168 hours. BNP: Invalid input(s): "POCBNP" CBG: Recent Labs  Lab 02/16/23 1143 02/16/23 1628 02/16/23 2247  02/17/23 0751 02/17/23 1124  GLUCAP 154* 197* 459* 368* 395*   D-Dimer No results for input(s): "DDIMER" in the last 72 hours. Hgb A1c No results for input(s): "HGBA1C" in the last 72 hours. Lipid Profile No results for input(s): "CHOL", "HDL", "LDLCALC", "TRIG", "CHOLHDL", "LDLDIRECT" in the last 72 hours. Thyroid function studies No results for input(s): "TSH", "T4TOTAL", "T3FREE", "THYROIDAB" in the last 72 hours.  Invalid input(s): "FREET3" Anemia work up No results for input(s): "VITAMINB12", "FOLATE", "FERRITIN", "TIBC", "IRON", "RETICCTPCT" in the last 72 hours. Urinalysis    Component Value Date/Time   COLORURINE YELLOW 01/04/2023 1430   APPEARANCEUR HAZY (A) 01/04/2023 1430   LABSPEC 1.004 (L) 01/04/2023 1430   PHURINE 7.0 01/04/2023 1430   GLUCOSEU NEGATIVE 01/04/2023 1430   HGBUR NEGATIVE 01/04/2023 1430   BILIRUBINUR NEGATIVE 01/04/2023 1430   BILIRUBINUR neg 05/06/2015 1421   KETONESUR NEGATIVE 01/04/2023 1430   PROTEINUR NEGATIVE 01/04/2023 1430   UROBILINOGEN 1.0 05/06/2015 1421   UROBILINOGEN 0.2 12/25/2014 1206   NITRITE POSITIVE (A) 01/04/2023 1430   LEUKOCYTESUR LARGE (A) 01/04/2023 1430   Sepsis Labs Recent Labs  Lab 02/14/23 2232 02/15/23 0418 02/16/23 0825 02/17/23 0558  WBC  5.6 9.4 10.6* 8.2   Microbiology Recent Results (from the past 240 hour(s))  Resp panel by RT-PCR (RSV, Flu A&B, Covid) Anterior Nasal Swab     Status: None   Collection Time: 02/14/23 11:07 PM   Specimen: Anterior Nasal Swab  Result Value Ref Range Status   SARS Coronavirus 2 by RT PCR NEGATIVE NEGATIVE Final    Comment: (NOTE) SARS-CoV-2 target nucleic acids are NOT DETECTED.  The SARS-CoV-2 RNA is generally detectable in upper respiratory specimens during the acute phase of infection. The lowest concentration of SARS-CoV-2 viral copies this assay can detect is 138 copies/mL. A negative result does not preclude SARS-Cov-2 infection and should not be used as the  sole basis for treatment or other patient management decisions. A negative result may occur with  improper specimen collection/handling, submission of specimen other than nasopharyngeal swab, presence of viral mutation(s) within the areas targeted by this assay, and inadequate number of viral copies(<138 copies/mL). A negative result must be combined with clinical observations, patient history, and epidemiological information. The expected result is Negative.  Fact Sheet for Patients:  BloggerCourse.com  Fact Sheet for Healthcare Providers:  SeriousBroker.it  This test is no t yet approved or cleared by the [place] FDA and  has been authorized for detection and/or diagnosis of SARS-CoV-2 by FDA under an Emergency Use Authorization (EUA). This EUA will remain  in effect (meaning this test can be used) for the duration of the COVID-19 declaration under Section 564(b)(1) of the Act, 21 U.S.C.section 360bbb-3(b)(1), unless the authorization is terminated  or revoked sooner.       Influenza A by PCR NEGATIVE NEGATIVE Final   Influenza B by PCR NEGATIVE NEGATIVE Final    Comment: (NOTE) The Xpert Xpress SARS-CoV-2/FLU/RSV plus assay is intended as an aid in the diagnosis of influenza from Nasopharyngeal swab specimens and should not be used as a sole basis for treatment. Nasal washings and aspirates are unacceptable for Xpert Xpress SARS-CoV-2/FLU/RSV testing.  Fact Sheet for Patients: BloggerCourse.com  Fact Sheet for Healthcare Providers: SeriousBroker.it  This test is not yet approved or cleared by the [place] FDA and has been authorized for detection and/or diagnosis of SARS-CoV-2 by FDA under an Emergency Use Authorization (EUA). This EUA will remain in effect (meaning this test can be used) for the duration of the COVID-19 declaration under Section 564(b)(1) of the  Act, 21 U.S.C. section 360bbb-3(b)(1), unless the authorization is terminated or revoked.     Resp Syncytial Virus by PCR NEGATIVE NEGATIVE Final    Comment: (NOTE) Fact Sheet for Patients: BloggerCourse.com  Fact Sheet for Healthcare Providers: SeriousBroker.it  This test is not yet approved or cleared by the [place] FDA and has been authorized for detection and/or diagnosis of SARS-CoV-2 by FDA under an Emergency Use Authorization (EUA). This EUA will remain in effect (meaning this test can be used) for the duration of the COVID-19 declaration under Section 564(b)(1) of the Act, 21 U.S.C. section 360bbb-3(b)(1), unless the authorization is terminated or revoked.  Performed at [facility], [address] [place], [place] [address]   MRSA Next Gen by PCR, Nasal     Status: None   Collection Time: 02/15/23  1:13 AM   Specimen: Nasal Mucosa; Nasal Swab  Result Value Ref Range Status   MRSA by PCR Next Gen NOT DETECTED NOT DETECTED Final    Comment: (NOTE) The GeneXpert MRSA Assay (FDA approved for NASAL specimens only), is one component of a  comprehensive MRSA colonization surveillance program. It is not intended to diagnose MRSA infection nor to guide or monitor treatment for MRSA infections. Test performance is not FDA approved in patients less than 52 years old. Performed at [facility], [address] [place], [place] [address]     Procedures/Studies: Varney Biles Abd 1 View  Result Date: 02/15/2023 CLINICAL DATA:  [id number] Nausea AND vomiting [id number] EXAM: ABDOMEN - 1 VIEW COMPARISON:  09/15/2021. FINDINGS: The bowel gas pattern is non-obstructive. No evidence of pneumoperitoneum, within the limitations of a supine film. No acute osseous abnormalities. There is old fracture deformity of the pubic symphysis and bilateral pubic rami. The soft tissues are within normal limits. Surgical  changes, devices, tubes and lines: Bilateral common iliac artery stents noted. IMPRESSION: *Nonobstructive bowel gas pattern. Electronically Signed   By: Jules Schick M.D.   On: 02/15/2023 16:42   DG Chest Port 1 View  Result Date: 02/14/2023 CLINICAL DATA:  AMS EXAM: PORTABLE CHEST 1 VIEW COMPARISON:  Chest x-ray 01/04/2023 FINDINGS: Correlate cardiac device overlying the left chest The heart and mediastinal contours are within normal limits. Aortic calcification. Biapical pleural/pulmonary scarring. Patchy airspace opacity within the left lower lobe. No pulmonary edema. No pleural effusion. No pneumothorax. No acute osseous abnormality. IMPRESSION: 1. Patchy airspace opacity within the left lower lobe. Followup PA and lateral chest X-ray is recommended in 3-4 weeks following therapy to ensure resolution and exclude underlying malignancy. 2.  Aortic Atherosclerosis (ICD10-I70.0). Electronically Signed   By: Tish Frederickson M.D.   On: 02/14/2023 23:02   CT HEAD WO CONTRAST ( )  Result Date: 02/14/2023 CLINICAL DATA:  Seizure and altered mental status EXAM: CT HEAD WITHOUT CONTRAST TECHNIQUE: Contiguous axial images were obtained from the base of the skull through the vertex without intravenous contrast. RADIATION DOSE REDUCTION: This exam was performed according to the departmental dose-optimization program which includes automated exposure control, adjustment of the mA and/or kV according to patient size and/or use of iterative reconstruction technique. COMPARISON:  None Available. FINDINGS: Brain: There is no mass, hemorrhage or extra-axial collection. The size and configuration of the ventricles and extra-axial CSF spaces are normal. There is hypoattenuation of the white matter, most commonly indicating chronic small vessel disease. Vascular: No abnormal hyperdensity of the major intracranial arteries or dural venous sinuses. No intracranial atherosclerosis. Skull: The visualized skull base, calvarium  and extracranial soft tissues are normal. Sinuses/Orbits: No fluid levels or advanced mucosal thickening of the visualized paranasal sinuses. No mastoid or middle ear effusion. The orbits are normal. IMPRESSION: Chronic small vessel disease without acute intracranial abnormality. Electronically Signed   By: Deatra Robinson M.D.   On: 02/14/2023 22:44   PERIPHERAL VASCULAR CATHETERIZATION  Result Date: 02/08/2023 Table formatting from the original result was not included. PATIENT: JANERA BRIEN      MRN: [id number] DOB: [birth year]    DATE OF PROCEDURE: 02/08/2023 INDICATIONS:  VALINA HOEPPNER is a 89 y.o. female who presents with a wound on her left great toe.  She presents for arteriography and possible intervention. PROCEDURE:  Ultrasound-guided access to the right common femoral artery Aortogram with bilateral iliac arteriogram Ultrasound-guided access in the left common femoral artery Kissing balloon angioplasty of bilateral common iliac artery stenoses (right common iliac artery 8 x 29 VBX stent, left common iliac 8 x 39 VBX stent, postdilatation with 9 mm x 2 cm balloon Bilateral lower extremity runoff SURGEON: Di Kindle. Edilia Bo, MD, FACS ANESTHESIA: Local EBL: Minimal TECHNIQUE: Both groins were  prepped and draped in the usual sterile fashion.  Under ultrasound guidance, after the skin was anesthetized, I cannulated the right common femoral artery with a micropuncture needle and a micropuncture sheath was introduced over a wire.  This was exchanged for a 5 [place] sheath over a Bentson wire.  By ultrasound the femoral artery was patent. A real-time image was obtained and sent to the server. The pigtail catheter was positioned at the L1 vertebral body and flush aortogram obtained.  Next the catheter was positioned above the bifurcation and an oblique iliac projection was obtained.  Next bilateral lower extremity runoff films were obtained. Given that the patient had a slightly diminished left femoral pulse  and given the bulky calcific plaque in the common iliac artery elected to address this given that this patient had limb threatening ischemia.  Under ultrasound guidance, after the skin was anesthetized, the left common femoral artery was cannulated with a micropuncture needle and a micropuncture sheath introduced over a wire.  I was then unable to get a Bentson wire through the area of disease but using a Berenstein catheter and angled Glidewire I was ultimately able to get through the disease in the common iliac artery.  A 5 French sheath was placed on the left.  Patient was heparinized and ACT was monitored throughout the procedure. The patient had a bulky plaque that extended up at the bifurcation and therefore I felt kissing balloons was indicated.  7 French sheaths were placed on both sides.  On the right side and 8 mm x 29 mm VBX stent was selected and was positioned across the stenosis extending slightly up into the aorta.  On the left side an 8 mm x 39 mm VBX stent was positioned to match the right common iliac stent at the top.  These were deployed.  Completion film showed that they were would not fully in apposition and therefore I went back with a 9 mm x 2 cm balloon and further dilated both stents.  Although the stents were still slightly undersized given her age I felt it would be safe to stop at this point rather than risk dissection or injury to the aorta or iliac arteries.  The stents were both widely patent with no significant inflow disease.  I did shoot a distal subtraction shot of the left foot to determine if the patient would be a candidate for retrograde intervention if indicated. Patient was transferred to the holding area for removal of the sheath.  No immediate complications were noted. FINDINGS: Single arteries bilaterally with no significant renal artery stenosis identified.  Single renal arteries bilaterally with no significant renal artery stenosis identified. Plaque extending into the  aortic bifurcation with moderate plaque in the right common iliac artery and a bulky calcific plaque in the left common iliac artery..  These were addressed with kissing balloon angioplasty and stenting as described above. On the left side, there is moderate disease throughout the superficial femoral artery and then a tight stenosis at the in the popliteal artery at the level of the knee.  The proximal tibial vessels are all occluded.  There is reconstitution of a small posterior tibial artery on the left a small peroneal and small anterior tibial artery and dorsalis pedis artery. On the right side the common femoral, deep femoral, and superficial femoral artery are patent.  The tibial vessels are all occluded proximally with reconstitution of the anterior tibial artery.  The posterior tibial artery reconstitutes but occludes above the ankle.  The peroneal artery is occluded. CLINICAL NOTE: Her inflow disease on the left has been addressed.  If she continues to have problems with her foot the only remaining option would be a retrograde intervention on the left although these arteries are quite small.  I will review the films with Dr. Randie Heinz. [name], MD, FACS [facility]   VAS [place] ABI WITH/WO TBI  Result Date: 01/27/2023  LOWER EXTREMITY DOPPLER STUDY Patient Name:  ALYEA BARGE  Date of Exam:   01/27/2023 Medical Rec #: [id number]        Accession #:    [id number] Date of Birth: 25-Mar-1934        Patient Gender: F Patient Age:   8 years Exam Location:  [facility] Procedure:      VAS [place] ABI WITH/WO TBI Referring Phys: --------------------------------------------------------------------------------  Indications: Bilateral great toe ulcerations present > 6 months.  Limitations: Today's exam was limited due to bandages. Performing Technologist: Dorthula Matas RVS, RCS  Examination Guidelines: A complete evaluation includes at minimum, Doppler waveform  signals and systolic blood pressure reading at the level of bilateral brachial, anterior tibial, and posterior tibial arteries, when vessel segments are accessible. Bilateral testing is considered an integral part of a complete examination. Photoelectric Plethysmograph (PPG) waveforms and toe systolic pressure readings are included as required and additional duplex testing as needed. Limited examinations for reoccurring indications may be performed as noted.  ABI Findings: +-----+------------------+-----+----------+----------------+ RightRt Pressure (mmHg)IndexWaveform  Comment          +-----+------------------+-----+----------+----------------+ PTA                         monophasicnon-compressible +-----+------------------+-----+----------+----------------+ DP                          monophasicnon-compressible +-----+------------------+-----+----------+----------------+ +--------+------------------+-----+----------+----------------+ Left    Lt Pressure (mmHg)IndexWaveform  Comment          +--------+------------------+-----+----------+----------------+ [id number]                                               +--------+------------------+-----+----------+----------------+ PTA                            monophasicnon-compressible +--------+------------------+-----+----------+----------------+ DP                             monophasicnon-compressible +--------+------------------+-----+----------+----------------+ +-------+-----------+-----------+------------+------------+ ABI/TBIToday's ABIToday's TBIPrevious ABIPrevious TBI +-------+-----------+-----------+------------+------------+ Right  [place]         bandages                            +-------+-----------+-----------+------------+------------+ Left   [place]         bandages                            +-------+-----------+-----------+------------+------------+ TOES Findings:  +----------+---------------+--------+--------+ Right ToesPressure (mmHg)WaveformComment  +----------+---------------+--------+--------+ 2nd Digit                        dampened +----------+---------------+--------+--------+  +---------+---------------+--------+--------+ Left ToesPressure (mmHg)WaveformComment  +---------+---------------+--------+--------+ 2nd Digit  dampened +---------+---------------+--------+--------+    Summary: Right: Resting right ankle-brachial index indicates noncompressible right lower extremity arteries. Waveforms are monophasic. Left: Resting left ankle-brachial index indicates noncompressible left lower extremity arteries. Waveforms are monophasic. *See table(s) above for measurements and observations.  Electronically signed by Lemar Livings MD on 01/27/2023 at 3:46:48 PM.    Final      Time coordinating discharge: Over 30 minutes    Lewie Chamber, MD  [facility] 02/18/2023, 5:12 PM

## 2023-02-19 ENCOUNTER — Ambulatory Visit (HOSPITAL_BASED_OUTPATIENT_CLINIC_OR_DEPARTMENT_OTHER): Payer: Medicare HMO | Admitting: Internal Medicine

## 2023-02-24 ENCOUNTER — Emergency Department (HOSPITAL_COMMUNITY): Payer: Medicare HMO

## 2023-02-24 ENCOUNTER — Inpatient Hospital Stay (HOSPITAL_COMMUNITY)
Admission: EM | Admit: 2023-02-24 | Discharge: 2023-03-01 | DRG: 178 | Disposition: A | Discharge status: Home Health | Payer: Medicare HMO | Attending: Internal Medicine | Admitting: Internal Medicine

## 2023-02-24 ENCOUNTER — Other Ambulatory Visit: Payer: Self-pay

## 2023-02-24 DIAGNOSIS — R531 Weakness: Secondary | ICD-10-CM | POA: Diagnosis not present

## 2023-02-24 DIAGNOSIS — E876 Hypokalemia: Secondary | ICD-10-CM | POA: Diagnosis present

## 2023-02-24 DIAGNOSIS — I959 Hypotension, unspecified: Secondary | ICD-10-CM | POA: Diagnosis present

## 2023-02-24 DIAGNOSIS — Z87898 Personal history of other specified conditions: Secondary | ICD-10-CM | POA: Diagnosis not present

## 2023-02-24 DIAGNOSIS — Z87891 Personal history of nicotine dependence: Secondary | ICD-10-CM

## 2023-02-24 DIAGNOSIS — I10 Essential (primary) hypertension: Secondary | ICD-10-CM | POA: Diagnosis present

## 2023-02-24 DIAGNOSIS — R4182 Altered mental status, unspecified: Secondary | ICD-10-CM | POA: Diagnosis not present

## 2023-02-24 DIAGNOSIS — M810 Age-related osteoporosis without current pathological fracture: Secondary | ICD-10-CM | POA: Diagnosis present

## 2023-02-24 DIAGNOSIS — I739 Peripheral vascular disease, unspecified: Secondary | ICD-10-CM | POA: Diagnosis present

## 2023-02-24 DIAGNOSIS — N182 Chronic kidney disease, stage 2 (mild): Secondary | ICD-10-CM | POA: Diagnosis present

## 2023-02-24 DIAGNOSIS — S0990XA Unspecified injury of head, initial encounter: Secondary | ICD-10-CM | POA: Diagnosis not present

## 2023-02-24 DIAGNOSIS — E871 Hypo-osmolality and hyponatremia: Secondary | ICD-10-CM | POA: Diagnosis present

## 2023-02-24 DIAGNOSIS — M3390 Dermatopolymyositis, unspecified, organ involvement unspecified: Secondary | ICD-10-CM | POA: Diagnosis present

## 2023-02-24 DIAGNOSIS — Z7902 Long term (current) use of antithrombotics/antiplatelets: Secondary | ICD-10-CM

## 2023-02-24 DIAGNOSIS — E1165 Type 2 diabetes mellitus with hyperglycemia: Secondary | ICD-10-CM | POA: Diagnosis present

## 2023-02-24 DIAGNOSIS — I130 Hypertensive heart and chronic kidney disease with heart failure and stage 1 through stage 4 chronic kidney disease, or unspecified chronic kidney disease: Secondary | ICD-10-CM | POA: Diagnosis present

## 2023-02-24 DIAGNOSIS — Z66 Do not resuscitate: Secondary | ICD-10-CM | POA: Diagnosis present

## 2023-02-24 DIAGNOSIS — Z833 Family history of diabetes mellitus: Secondary | ICD-10-CM

## 2023-02-24 DIAGNOSIS — L89891 Pressure ulcer of other site, stage 1: Secondary | ICD-10-CM | POA: Diagnosis present

## 2023-02-24 DIAGNOSIS — Z79899 Other long term (current) drug therapy: Secondary | ICD-10-CM

## 2023-02-24 DIAGNOSIS — F0390 Unspecified dementia without behavioral disturbance: Secondary | ICD-10-CM | POA: Diagnosis present

## 2023-02-24 DIAGNOSIS — L8990 Pressure ulcer of unspecified site, unspecified stage: Secondary | ICD-10-CM | POA: Diagnosis present

## 2023-02-24 DIAGNOSIS — E1159 Type 2 diabetes mellitus with other circulatory complications: Secondary | ICD-10-CM | POA: Diagnosis present

## 2023-02-24 DIAGNOSIS — I5032 Chronic diastolic (congestive) heart failure: Secondary | ICD-10-CM | POA: Diagnosis present

## 2023-02-24 DIAGNOSIS — E861 Hypovolemia: Secondary | ICD-10-CM | POA: Diagnosis present

## 2023-02-24 DIAGNOSIS — Z8249 Family history of ischemic heart disease and other diseases of the circulatory system: Secondary | ICD-10-CM

## 2023-02-24 DIAGNOSIS — U071 COVID-19: Principal | ICD-10-CM | POA: Diagnosis present

## 2023-02-24 DIAGNOSIS — E039 Hypothyroidism, unspecified: Secondary | ICD-10-CM | POA: Diagnosis not present

## 2023-02-24 DIAGNOSIS — E7800 Pure hypercholesterolemia, unspecified: Secondary | ICD-10-CM | POA: Diagnosis present

## 2023-02-24 DIAGNOSIS — L89301 Pressure ulcer of unspecified buttock, stage 1: Secondary | ICD-10-CM | POA: Diagnosis present

## 2023-02-24 DIAGNOSIS — Z8701 Personal history of pneumonia (recurrent): Secondary | ICD-10-CM

## 2023-02-24 DIAGNOSIS — I4430 Unspecified atrioventricular block: Secondary | ICD-10-CM | POA: Diagnosis not present

## 2023-02-24 DIAGNOSIS — E1122 Type 2 diabetes mellitus with diabetic chronic kidney disease: Secondary | ICD-10-CM | POA: Diagnosis present

## 2023-02-24 DIAGNOSIS — Z882 Allergy status to sulfonamides status: Secondary | ICD-10-CM

## 2023-02-24 DIAGNOSIS — Z888 Allergy status to other drugs, medicaments and biological substances status: Secondary | ICD-10-CM

## 2023-02-24 DIAGNOSIS — K219 Gastro-esophageal reflux disease without esophagitis: Secondary | ICD-10-CM | POA: Diagnosis present

## 2023-02-24 DIAGNOSIS — Z794 Long term (current) use of insulin: Secondary | ICD-10-CM

## 2023-02-24 DIAGNOSIS — I499 Cardiac arrhythmia, unspecified: Secondary | ICD-10-CM | POA: Diagnosis not present

## 2023-02-24 DIAGNOSIS — E860 Dehydration: Secondary | ICD-10-CM | POA: Diagnosis present

## 2023-02-24 DIAGNOSIS — E440 Moderate protein-calorie malnutrition: Secondary | ICD-10-CM | POA: Diagnosis not present

## 2023-02-24 DIAGNOSIS — I152 Hypertension secondary to endocrine disorders: Secondary | ICD-10-CM | POA: Diagnosis present

## 2023-02-24 DIAGNOSIS — L89151 Pressure ulcer of sacral region, stage 1: Secondary | ICD-10-CM | POA: Diagnosis present

## 2023-02-24 DIAGNOSIS — J189 Pneumonia, unspecified organism: Secondary | ICD-10-CM | POA: Diagnosis not present

## 2023-02-24 DIAGNOSIS — Z79890 Hormone replacement therapy (postmenopausal): Secondary | ICD-10-CM

## 2023-02-24 DIAGNOSIS — Z886 Allergy status to analgesic agent status: Secondary | ICD-10-CM

## 2023-02-24 DIAGNOSIS — Z825 Family history of asthma and other chronic lower respiratory diseases: Secondary | ICD-10-CM

## 2023-02-24 DIAGNOSIS — E1151 Type 2 diabetes mellitus with diabetic peripheral angiopathy without gangrene: Secondary | ICD-10-CM | POA: Diagnosis present

## 2023-02-24 DIAGNOSIS — Z7952 Long term (current) use of systemic steroids: Secondary | ICD-10-CM

## 2023-02-24 DIAGNOSIS — M3313 Other dermatomyositis without myopathy: Secondary | ICD-10-CM | POA: Diagnosis present

## 2023-02-24 DIAGNOSIS — E11649 Type 2 diabetes mellitus with hypoglycemia without coma: Secondary | ICD-10-CM | POA: Diagnosis not present

## 2023-02-24 DIAGNOSIS — Z800 Family history of malignant neoplasm of digestive organs: Secondary | ICD-10-CM

## 2023-02-24 DIAGNOSIS — Z7401 Bed confinement status: Secondary | ICD-10-CM

## 2023-02-24 DIAGNOSIS — Z8673 Personal history of transient ischemic attack (TIA), and cerebral infarction without residual deficits: Secondary | ICD-10-CM

## 2023-02-24 DIAGNOSIS — R918 Other nonspecific abnormal finding of lung field: Secondary | ICD-10-CM | POA: Diagnosis not present

## 2023-02-24 DIAGNOSIS — Z7982 Long term (current) use of aspirin: Secondary | ICD-10-CM

## 2023-02-24 DIAGNOSIS — B370 Candidal stomatitis: Secondary | ICD-10-CM | POA: Diagnosis present

## 2023-02-24 LAB — CBC
HCT: 26.6 % — ABNORMAL LOW (ref 36.0–46.0)
Hemoglobin: 8.6 g/dL — ABNORMAL LOW (ref 12.0–15.0)
MCH: 32.2 pg (ref 26.0–34.0)
MCHC: 32.3 g/dL (ref 30.0–36.0)
MCV: 99.6 fL (ref 80.0–100.0)
Platelets: 456 10*3/uL — ABNORMAL HIGH (ref 150–400)
RBC: 2.67 MIL/uL — ABNORMAL LOW (ref 3.87–5.11)
RDW: 14.6 % (ref 11.5–15.5)
WBC: 8.1 10*3/uL (ref 4.0–10.5)
nRBC: 0 % (ref 0.0–0.2)

## 2023-02-24 LAB — BASIC METABOLIC PANEL
Anion gap: 10 (ref 5–15)
BUN: 10 mg/dL (ref 8–23)
CO2: 27 mmol/L (ref 22–32)
Calcium: 9.2 mg/dL (ref 8.9–10.3)
Chloride: 96 mmol/L — ABNORMAL LOW (ref 98–111)
Creatinine, Ser: 0.81 mg/dL (ref 0.44–1.00)
GFR, Estimated: >60 mL/min (ref >60)
Glucose, Bld: 180 mg/dL — ABNORMAL HIGH (ref 70–99)
Potassium: 4.3 mmol/L (ref 3.5–5.1)
Sodium: 133 mmol/L — ABNORMAL LOW (ref 135–145)

## 2023-02-24 LAB — URINALYSIS, ROUTINE W REFLEX MICROSCOPIC
Bilirubin Urine: NEGATIVE
Glucose, UA: NEGATIVE mg/dL
Hgb urine dipstick: NEGATIVE
Ketones, ur: NEGATIVE mg/dL
Leukocytes,Ua: NEGATIVE
Nitrite: NEGATIVE
Protein, ur: NEGATIVE mg/dL
Specific Gravity, Urine: 1.004 — ABNORMAL LOW (ref 1.005–1.030)
pH: 8 (ref 5.0–8.0)

## 2023-02-24 LAB — HEPATIC FUNCTION PANEL
ALT: 22 U/L (ref 0–44)
AST: 35 U/L (ref 15–41)
Albumin: 3 g/dL — ABNORMAL LOW (ref 3.5–5.0)
Alkaline Phosphatase: 68 U/L (ref 38–126)
Bilirubin, Direct: 0.1 mg/dL (ref 0.0–0.2)
Indirect Bilirubin: 0.5 mg/dL (ref 0.3–0.9)
Total Bilirubin: 0.6 mg/dL (ref 0.3–1.2)
Total Protein: 6.6 g/dL (ref 6.5–8.1)

## 2023-02-24 LAB — CBG MONITORING, ED
Glucose-Capillary: 174 mg/dL — ABNORMAL HIGH (ref 70–99)
Glucose-Capillary: 51 mg/dL — ABNORMAL LOW (ref 70–99)

## 2023-02-24 LAB — SARS CORONAVIRUS 2 BY RT PCR: SARS Coronavirus 2 by RT PCR: POSITIVE — AB

## 2023-02-24 MED ORDER — DEXTROSE 50 % IV SOLN
1.0000 | Freq: Once | INTRAVENOUS | Status: AC
  Administered 2023-02-24: 50 mL via INTRAVENOUS
  Filled 2023-02-24: qty 50

## 2023-02-24 MED ORDER — EZETIMIBE 10 MG PO TABS
10.0000 mg | ORAL_TABLET | Freq: Every day | ORAL | Status: DC
  Administered 2023-02-25 – 2023-03-01 (×5): 10 mg via ORAL
  Filled 2023-02-24 (×5): qty 1

## 2023-02-24 MED ORDER — MIDODRINE HCL 5 MG PO TABS
2.5000 mg | ORAL_TABLET | Freq: Three times a day (TID) | ORAL | Status: DC
  Administered 2023-02-25 – 2023-03-01 (×13): 2.5 mg via ORAL
  Filled 2023-02-24 (×12): qty 1

## 2023-02-24 MED ORDER — FERROUS SULFATE 300 (60 FE) MG/5ML PO SOLN
330.0000 mg | ORAL | Status: DC
  Administered 2023-02-26 – 2023-03-01 (×2): 330 mg via ORAL
  Filled 2023-02-24 (×3): qty 10

## 2023-02-24 MED ORDER — LORATADINE 10 MG PO TABS
10.0000 mg | ORAL_TABLET | Freq: Every day | ORAL | Status: DC
  Administered 2023-02-25 – 2023-03-01 (×5): 10 mg via ORAL
  Filled 2023-02-24 (×6): qty 1

## 2023-02-24 MED ORDER — ASPIRIN 81 MG PO TBEC
81.0000 mg | DELAYED_RELEASE_TABLET | Freq: Every day | ORAL | Status: DC
  Administered 2023-02-25 – 2023-03-01 (×5): 81 mg via ORAL
  Filled 2023-02-24 (×5): qty 1

## 2023-02-24 MED ORDER — LACTATED RINGERS IV BOLUS
1000.0000 mL | Freq: Once | INTRAVENOUS | Status: AC
  Administered 2023-02-24: 1000 mL via INTRAVENOUS

## 2023-02-24 MED ORDER — FOLIC ACID 1 MG PO TABS
1.0000 mg | ORAL_TABLET | Freq: Every day | ORAL | Status: DC
  Administered 2023-02-24 – 2023-02-28 (×5): 1 mg via ORAL
  Filled 2023-02-24 (×5): qty 1

## 2023-02-24 MED ORDER — ACETAMINOPHEN 325 MG PO TABS
650.0000 mg | ORAL_TABLET | Freq: Three times a day (TID) | ORAL | Status: DC | PRN
  Administered 2023-02-25: 650 mg via ORAL
  Filled 2023-02-24: qty 2

## 2023-02-24 MED ORDER — FAMOTIDINE 20 MG PO TABS
20.0000 mg | ORAL_TABLET | Freq: Every day | ORAL | Status: DC
  Administered 2023-02-24 – 2023-02-28 (×5): 20 mg via ORAL
  Filled 2023-02-24 (×5): qty 1

## 2023-02-24 MED ORDER — PREDNISONE 5 MG PO TABS
5.0000 mg | ORAL_TABLET | Freq: Every day | ORAL | Status: DC
  Administered 2023-02-25 – 2023-03-01 (×5): 5 mg via ORAL
  Filled 2023-02-24 (×5): qty 1

## 2023-02-24 MED ORDER — CLOPIDOGREL BISULFATE 75 MG PO TABS
75.0000 mg | ORAL_TABLET | Freq: Every day | ORAL | Status: DC
  Administered 2023-02-25 – 2023-03-01 (×5): 75 mg via ORAL
  Filled 2023-02-24 (×6): qty 1

## 2023-02-24 MED ORDER — LEVOTHYROXINE SODIUM 100 MCG PO TABS
100.0000 ug | ORAL_TABLET | Freq: Every day | ORAL | Status: DC
  Administered 2023-02-25 – 2023-03-01 (×5): 100 ug via ORAL
  Filled 2023-02-24 (×5): qty 1

## 2023-02-24 MED ORDER — LEVETIRACETAM 500 MG PO TABS
500.0000 mg | ORAL_TABLET | Freq: Two times a day (BID) | ORAL | Status: DC
  Administered 2023-02-24 – 2023-02-25 (×3): 500 mg via ORAL
  Filled 2023-02-24 (×4): qty 1

## 2023-02-24 NOTE — ED Notes (Signed)
Pt sleeping comfortably in bed.

## 2023-02-24 NOTE — ED Notes (Signed)
Gave orange juice due to low blood sugar.

## 2023-02-24 NOTE — Assessment & Plan Note (Signed)
Continue midodrine  

## 2023-02-24 NOTE — Assessment & Plan Note (Signed)
--  continue Keppra 

## 2023-02-24 NOTE — ED Triage Notes (Signed)
Pt BIB [facility] from home with reports of increased weakness. Per EMS pt was recently treated for pneumonia and since then pt has been increasingly weak. Pt with hx of dementia.

## 2023-02-24 NOTE — Assessment & Plan Note (Signed)
-  start Paxlovid  -not hypoxic -symptomatic managment

## 2023-02-24 NOTE — Assessment & Plan Note (Signed)
-  continue levothyroxine -recent TSH last week was normal limits

## 2023-02-24 NOTE — Assessment & Plan Note (Signed)
-  Secondary to COVID and recent hospitalization for pneumonia

## 2023-02-24 NOTE — Assessment & Plan Note (Signed)
-  hold immunosuppressives for now with COVID -continue chronic prednisone

## 2023-02-24 NOTE — ED Provider Notes (Signed)
[place] EMERGENCY DEPARTMENT AT [facility] Provider Note   CSN: [id number] Arrival date & time: 02/24/23  1441     History Chief Complaint  Patient presents with   Weakness    HPI [name] [name] is a 89 y.o. female presenting for chief complaint of AMS.  She is coming from home.  Extensive medical history.  Extensive history of multiple comorbid medical problems, CKD, PAD, urinary retention, hx of encephalopathy 2/2 UTI. DNR per admission 8 days ago for CAP.  Patient's recorded medical, surgical, social, medication list and allergies were reviewed in the Snapshot window as part of the initial history.   Review of Systems   Review of Systems  Unable to perform ROS: Dementia    Physical Exam Updated Vital Signs BP (!) 170/60   Pulse (!) 59   Temp 99 F (37.2 C) (Oral)   Resp 15   SpO2 98%  Physical Exam Vitals and nursing note reviewed.  Constitutional:      General: She is not in acute distress.    Appearance: She is well-developed. She is ill-appearing.  HENT:     Head: Normocephalic and atraumatic.  Eyes:     Conjunctiva/sclera: Conjunctivae normal.  Cardiovascular:     Rate and Rhythm: Normal rate and regular rhythm.     Heart sounds: No murmur heard. Pulmonary:     Effort: Pulmonary effort is normal. No respiratory distress.     Breath sounds: Normal breath sounds.  Abdominal:     General: There is no distension.     Palpations: Abdomen is soft.     Tenderness: There is no abdominal tenderness. There is no right CVA tenderness or left CVA tenderness.  Musculoskeletal:        General: No swelling or tenderness. Normal range of motion.     Cervical back: Neck supple.  Skin:    General: Skin is warm and dry.  Neurological:     General: No focal deficit present.     Mental Status: She is alert. She is disoriented.     Cranial Nerves: No cranial nerve deficit.      ED Course/ Medical Decision Making/ A&P    Procedures Procedures    Medications Ordered in ED Medications  lactated ringers bolus 1,000 mL (1,000 mLs Intravenous New Bag/Given 02/24/23 1629)  Medical Decision Making:   ALEYDA [name] is a 89 y.o. female who presented to the ED today with altered mental status detailed above.    Additional history discussed with patient's family/caregivers.  Patient placed on continuous vitals and telemetry monitoring while in ED which was reviewed periodically.  Complete initial physical exam performed, notably the patient  was HDS in NAD.    Reviewed and confirmed nursing documentation for past medical history, family history, social history.    Initial Assessment:   With the patient's presentation of altered mental status, most likely diagnosis is delerium 2/2 infectious etiology (UTI/CAP/URI) vs metabolic abnormality (Na/K/Mg/Ca) vs nonspecific etiology. Other diagnoses were considered including (but not limited to) CVA, ICH, intracranial mass, critical dehydration, heptatic dysfunction, uremia, hypercarbia, intoxication, endrocrine abnormality, toxidrome. These are considered less likely due to history of present illness and physical exam findings.   This is most consistent with an acute life/limb threatening illness complicated by underlying chronic conditions.  Initial Plan:  Screening labs including CBC and Metabolic panel to evaluate for infectious or metabolic etiology of disease.  Urinalysis with reflex culture ordered to evaluate for UTI or relevant urologic/nephrologic  pathology.  CXR to evaluate for structural/infectious intrathoracic pathology.  EKG to evaluate for cardiac pathology CT head to evaluate for structural intracranial abnormality Viral screening for alternative etiologies of the patient's altered mentation. Objective evaluation as below reviewed   Initial Study Results:   Laboratory  All laboratory results reviewed without evidence of clinically relevant pathology.     EKG EKG was reviewed  independently. Rate, rhythm, axis, intervals all examined and without medically relevant abnormality. ST segments without concerns for elevations.    Radiology:  All images reviewed independently. Agree with radiology report at this time.   CT HEAD WO CONTRAST ( )  Result Date: 02/24/2023 CLINICAL DATA:  Head trauma, minor (Age >= 65y) Mental status change, unknown cause EXAM: CT HEAD WITHOUT CONTRAST TECHNIQUE: Contiguous axial images were obtained from the base of the skull through the vertex without intravenous contrast. RADIATION DOSE REDUCTION: This exam was performed according to the departmental dose-optimization program which includes automated exposure control, adjustment of the mA and/or kV according to patient size and/or use of iterative reconstruction technique. COMPARISON:  02/14/2023 FINDINGS: Brain: Mild parenchymal volume loss is commensurate with the patient's age. Moderate bilateral periventricular and deep white matter changes are present likely reflecting the sequela of small vessel ischemia. These appear stable since prior examination. Remote bilateral thalamic lacunar infarcts again noted. No evidence of acute intracranial hemorrhage or infarct. No abnormal mass effect or midline shift. No abnormal intra or extra-axial mass lesion or fluid collection. Ventricular size is normal. Cerebellum is unremarkable. Vascular: No hyperdense vessel or unexpected calcification. Skull: Normal. Negative for fracture or focal lesion. Sinuses/Orbits: No acute finding. Other: Mastoid air cells and middle ear cavities are clear. IMPRESSION: 1. No acute intracranial hemorrhage or infarct. 2. Stable senescent change. 3. Remote bilateral thalamic lacunar infarcts. Electronically Signed   By: Helyn Numbers M.D.   On: 02/24/2023 20:16   DG Chest Port 1 View  Result Date: 02/24/2023 CLINICAL DATA:  Altered mental status EXAM: PORTABLE CHEST 1 VIEW COMPARISON:  02/14/2023 x-ray FINDINGS: Hyperinflation. No  consolidation, pneumothorax or effusion. No edema. Normal cardiopericardial silhouette with a calcified aorta. The previous opacity left lung base is no longer seen. Loop recorder overlying the lower left thorax. IMPRESSION: Improved left lung base opacity. Hyperinflation.  No effusion or edema. Loop recorder Electronically Signed   By: Karen Kays M.D.   On: 02/24/2023 17:11   DG Abd 1 View  Result Date: 02/15/2023 CLINICAL DATA:  [id number] Nausea AND vomiting [id number] EXAM: ABDOMEN - 1 VIEW COMPARISON:  09/15/2021. FINDINGS: The bowel gas pattern is non-obstructive. No evidence of pneumoperitoneum, within the limitations of a supine film. No acute osseous abnormalities. There is old fracture deformity of the pubic symphysis and bilateral pubic rami. The soft tissues are within normal limits. Surgical changes, devices, tubes and lines: Bilateral common iliac artery stents noted. IMPRESSION: *Nonobstructive bowel gas pattern. Electronically Signed   By: Jules Schick M.D.   On: 02/15/2023 16:42   DG Chest Port 1 View  Result Date: 02/14/2023 CLINICAL DATA:  AMS EXAM: PORTABLE CHEST 1 VIEW COMPARISON:  Chest x-ray 01/04/2023 FINDINGS: Correlate cardiac device overlying the left chest The heart and mediastinal contours are within normal limits. Aortic calcification. Biapical pleural/pulmonary scarring. Patchy airspace opacity within the left lower lobe. No pulmonary edema. No pleural effusion. No pneumothorax. No acute osseous abnormality. IMPRESSION: 1. Patchy airspace opacity within the left lower lobe. Followup PA and lateral chest X-ray is recommended in 3-4 weeks following therapy to  ensure resolution and exclude underlying malignancy. 2.  Aortic Atherosclerosis (ICD10-I70.0). Electronically Signed   By: Tish Frederickson M.D.   On: 02/14/2023 23:02   CT HEAD WO CONTRAST ( )  Result Date: 02/14/2023 CLINICAL DATA:  Seizure and altered mental status EXAM: CT HEAD WITHOUT CONTRAST TECHNIQUE: Contiguous  axial images were obtained from the base of the skull through the vertex without intravenous contrast. RADIATION DOSE REDUCTION: This exam was performed according to the departmental dose-optimization program which includes automated exposure control, adjustment of the mA and/or kV according to patient size and/or use of iterative reconstruction technique. COMPARISON:  None Available. FINDINGS: Brain: There is no mass, hemorrhage or extra-axial collection. The size and configuration of the ventricles and extra-axial CSF spaces are normal. There is hypoattenuation of the white matter, most commonly indicating chronic small vessel disease. Vascular: No abnormal hyperdensity of the major intracranial arteries or dural venous sinuses. No intracranial atherosclerosis. Skull: The visualized skull base, calvarium and extracranial soft tissues are normal. Sinuses/Orbits: No fluid levels or advanced mucosal thickening of the visualized paranasal sinuses. No mastoid or middle ear effusion. The orbits are normal. IMPRESSION: Chronic small vessel disease without acute intracranial abnormality. Electronically Signed   By: Deatra Robinson M.D.   On: 02/14/2023 22:44   PERIPHERAL VASCULAR CATHETERIZATION  Result Date: 02/08/2023 Table formatting from the original result was not included. PATIENT: MAIDEN COFFEY      MRN: [id number] DOB: 1934/04/25    DATE OF PROCEDURE: 02/08/2023 INDICATIONS:  NORMAL KUZMICH is a 89 y.o. female who presents with a wound on her left great toe.  She presents for arteriography and possible intervention. PROCEDURE:  Ultrasound-guided access to the right common femoral artery Aortogram with bilateral iliac arteriogram Ultrasound-guided access in the left common femoral artery Kissing balloon angioplasty of bilateral common iliac artery stenoses (right common iliac artery 8 x 29 VBX stent, left common iliac 8 x 39 VBX stent, postdilatation with 9 mm x 2 cm balloon Bilateral lower extremity runoff  SURGEON: Di Kindle. Edilia Bo, MD, FACS ANESTHESIA: Local EBL: Minimal TECHNIQUE: Both groins were prepped and draped in the usual sterile fashion.  Under ultrasound guidance, after the skin was anesthetized, I cannulated the right common femoral artery with a micropuncture needle and a micropuncture sheath was introduced over a wire.  This was exchanged for a 5 [place] sheath over a Bentson wire.  By ultrasound the femoral artery was patent. A real-time image was obtained and sent to the server. The pigtail catheter was positioned at the L1 vertebral body and flush aortogram obtained.  Next the catheter was positioned above the bifurcation and an oblique iliac projection was obtained.  Next bilateral lower extremity runoff films were obtained. Given that the patient had a slightly diminished left femoral pulse and given the bulky calcific plaque in the common iliac artery elected to address this given that this patient had limb threatening ischemia.  Under ultrasound guidance, after the skin was anesthetized, the left common femoral artery was cannulated with a micropuncture needle and a micropuncture sheath introduced over a wire.  I was then unable to get a Bentson wire through the area of disease but using a Berenstein catheter and angled Glidewire I was ultimately able to get through the disease in the common iliac artery.  A 5 French sheath was placed on the left.  Patient was heparinized and ACT was monitored throughout the procedure. The patient had a bulky plaque that extended up at the bifurcation and therefore  I felt kissing balloons was indicated.  7 French sheaths were placed on both sides.  On the right side and 8 mm x 29 mm VBX stent was selected and was positioned across the stenosis extending slightly up into the aorta.  On the left side an 8 mm x 39 mm VBX stent was positioned to match the right common iliac stent at the top.  These were deployed.  Completion film showed that they were would not  fully in apposition and therefore I went back with a 9 mm x 2 cm balloon and further dilated both stents.  Although the stents were still slightly undersized given her age I felt it would be safe to stop at this point rather than risk dissection or injury to the aorta or iliac arteries.  The stents were both widely patent with no significant inflow disease.  I did shoot a distal subtraction shot of the left foot to determine if the patient would be a candidate for retrograde intervention if indicated. Patient was transferred to the holding area for removal of the sheath.  No immediate complications were noted. FINDINGS: Single arteries bilaterally with no significant renal artery stenosis identified.  Single renal arteries bilaterally with no significant renal artery stenosis identified. Plaque extending into the aortic bifurcation with moderate plaque in the right common iliac artery and a bulky calcific plaque in the left common iliac artery..  These were addressed with kissing balloon angioplasty and stenting as described above. On the left side, there is moderate disease throughout the superficial femoral artery and then a tight stenosis at the in the popliteal artery at the level of the knee.  The proximal tibial vessels are all occluded.  There is reconstitution of a small posterior tibial artery on the left a small peroneal and small anterior tibial artery and dorsalis pedis artery. On the right side the common femoral, deep femoral, and superficial femoral artery are patent.  The tibial vessels are all occluded proximally with reconstitution of the anterior tibial artery.  The posterior tibial artery reconstitutes but occludes above the ankle.  The peroneal artery is occluded. CLINICAL NOTE: Her inflow disease on the left has been addressed.  If she continues to have problems with her foot the only remaining option would be a retrograde intervention on the left although these arteries are quite small.  I  will review the films with Dr. Randie Heinz. [name], MD, FACS [facility]   VAS [place] ABI WITH/WO TBI  Result Date: 01/27/2023  LOWER EXTREMITY DOPPLER STUDY Patient Name:  ZARAH BOYS  Date of Exam:   01/27/2023 Medical Rec #: [id number]        Accession #:    [id number] Date of Birth: Oct 12, 1933        Patient Gender: F Patient Age:   52 years Exam Location:  [facility] Procedure:      VAS [place] ABI WITH/WO TBI Referring Phys: --------------------------------------------------------------------------------  Indications: Bilateral great toe ulcerations present > 6 months.  Limitations: Today's exam was limited due to bandages. Performing Technologist: Dorthula Matas RVS, RCS  Examination Guidelines: A complete evaluation includes at minimum, Doppler waveform signals and systolic blood pressure reading at the level of bilateral brachial, anterior tibial, and posterior tibial arteries, when vessel segments are accessible. Bilateral testing is considered an integral part of a complete examination. Photoelectric Plethysmograph (PPG) waveforms and toe systolic pressure readings are included as required and additional duplex testing as needed. Limited  examinations for reoccurring indications may be performed as noted.  ABI Findings: +-----+------------------+-----+----------+----------------+ RightRt Pressure (mmHg)IndexWaveform  Comment          +-----+------------------+-----+----------+----------------+ PTA                         monophasicnon-compressible +-----+------------------+-----+----------+----------------+ DP                          monophasicnon-compressible +-----+------------------+-----+----------+----------------+ +--------+------------------+-----+----------+----------------+ Left    Lt Pressure (mmHg)IndexWaveform  Comment          +--------+------------------+-----+----------+----------------+ [id number]                                                +--------+------------------+-----+----------+----------------+ PTA                            monophasicnon-compressible +--------+------------------+-----+----------+----------------+ DP                             monophasicnon-compressible +--------+------------------+-----+----------+----------------+ +-------+-----------+-----------+------------+------------+ ABI/TBIToday's ABIToday's TBIPrevious ABIPrevious TBI +-------+-----------+-----------+------------+------------+ Right  [place]         bandages                            +-------+-----------+-----------+------------+------------+ Left   [place]         bandages                            +-------+-----------+-----------+------------+------------+ TOES Findings: +----------+---------------+--------+--------+ Right ToesPressure (mmHg)WaveformComment  +----------+---------------+--------+--------+ 2nd Digit                        dampened +----------+---------------+--------+--------+  +---------+---------------+--------+--------+ Left ToesPressure (mmHg)WaveformComment  +---------+---------------+--------+--------+ 2nd Digit                       dampened +---------+---------------+--------+--------+    Summary: Right: Resting right ankle-brachial index indicates noncompressible right lower extremity arteries. Waveforms are monophasic. Left: Resting left ankle-brachial index indicates noncompressible left lower extremity arteries. Waveforms are monophasic. *See table(s) above for measurements and observations.  Electronically signed by Lemar Livings MD on 01/27/2023 at 3:46:48 PM.    Final       Final Assessment and Plan:   Patient COVID-positive, continues to be fairly diffusely confused on serial reassessments.  Discussed with family the patient is likely to require rehabilitation stay due to multiple serial hospitalizations with compounding deconditioning in the setting of  COVID infection she is high risk for prolonged hospitalizations and recurrent hospitalizations.  Family expressed understanding and would be willing to consider.  Discussed with hospitalist who agreed for admission for COVID treatment.    Clinical Impression:  1. COVID      Admit   Final Clinical Impression(s) / ED Diagnoses Final diagnoses:  COVID    Rx / DC Orders ED Discharge Orders     None         Glyn Ade, MD 02/24/23 2306

## 2023-02-24 NOTE — Assessment & Plan Note (Signed)
-  BG of 50s  -hold any insulin or sliding scale  -last HbA1C of 8.4

## 2023-02-24 NOTE — H&P (Signed)
History and Physical    Patient: [name] [name] YTK:[id number] DOB: 04-28-1934 DOA: 02/24/2023 DOS: the patient was seen and examined on 02/25/2023 PCP: [name], MD  Patient coming from: Home  Chief Complaint:  Chief Complaint  Patient presents with   Weakness   HPI: [name] [name] is a 89 y.o. female with medical history significant of dementia, CHF, insulin-dependent type 2 diabetes, CVA, PAD, hypothyroidism, CKD 2, and GERD who presents with worsening weakness.  Daughter at beside provides history.  Patient was just discharged with aspiration pneumonia last week.  Has completed her course of antibiotics. Several days ago became more lethargic and fatigued. Has worsening productive cough. Denies shortness of breath. Hurts all over. She is on 2 immunosuppressives and chronic prednisone for dermatopolymyositis. Pt mostly bedbound at baseline and requires assistance with transfers and ADLS. Has two daughters at home that provide 24/7 care.   In the ED, she was afebrile, BP elevated to 170/60.  She was noted to be positive for COVID.  No other visit process.  Chest x-ray showed improved left lung base opacity. No significant electrolyte abnormalities and BMP. CT head negative for acute findings.  Patient was given 1 L bolus of LR.  Hospital then consulted for weakness from COVID and dehydration.  Review of Systems: As mentioned in the history of present illness. All other systems reviewed and are negative. Past Medical History:  Diagnosis Date   Arthritis    "knees, legs" (06/10/2016)   Ascites    Chronic kidney disease    "related to my diabetes"   Chronic lower back pain    Diabetes mellitus without complication (HCC)    Eczema    GERD (gastroesophageal reflux disease)    High cholesterol    Hyperlipidemia    Hypertension    Hypothyroidism    OAB (overactive bladder)    Osteoporosis    Thyroid disease    Type II diabetes mellitus (HCC)    Urticaria    Past Surgical  History:  Procedure Laterality Date   ABDOMINAL AORTOGRAM W/LOWER EXTREMITY N/A 02/08/2023   Procedure: ABDOMINAL AORTOGRAM W/LOWER EXTREMITY;  Surgeon: Chuck Hint, MD;  Location: [facility] INVASIVE CV LAB;  Service: Vascular;  Laterality: N/A;   ABDOMINAL HYSTERECTOMY     KNEE ARTHROSCOPY     LAPAROTOMY N/A 12/03/2016   Procedure: EXPLORATORY LAPAROTOMY, LYSIS OF ADHESIONS;  Surgeon: Darnell Level, MD;  Location: [facility] ORS;  Service: General;  Laterality: N/A;   LOOP RECORDER INSERTION N/A 08/23/2019   Procedure: LOOP RECORDER INSERTION;  Surgeon: Marinus Maw, MD;  Location: [facility] INVASIVE CV LAB;  Service: Cardiovascular;  Laterality: N/A;   PERIPHERAL VASCULAR INTERVENTION  02/08/2023   Procedure: PERIPHERAL VASCULAR INTERVENTION;  Surgeon: Chuck Hint, MD;  Location: [facility] INVASIVE CV LAB;  Service: Vascular;;   vocal cord polyps     Social History:  reports that she has never smoked. She quit smokeless tobacco use about 39 years ago.  Her smokeless tobacco use included chew. She reports that she does not drink alcohol and does not use drugs.  Allergies  Allergen Reactions   Nsaids Other (See Comments)    No NSAIDS due to kidney function- ESPECIALLY ibuprofen or Aleve   Biaxin [Clarithromycin] Hives   Statins Other (See Comments)    Muscle pain (myositis per daugher)   Bactrim [Sulfamethoxazole-Trimethoprim] Hives and Rash   Lisinopril Cough   Sulfa Antibiotics Hives, Rash and Other (See Comments)    NO sulfa-based meds!!  Family History  Problem Relation Age of Onset   Diabetes Mother    Hypertension Mother    Asthma Father    Diabetes Sister    Stomach cancer Sister    Diabetes Brother    Allergic rhinitis Neg Hx    Eczema Neg Hx    Urticaria Neg Hx     Prior to Admission medications   Medication Sig Start Date End Date Taking? Authorizing Provider  acetaminophen (TYLENOL) 650 MG CR tablet Take 650 mg by mouth every 8 (eight) hours as needed for pain.     [provider]  BAYER LOW DOSE 81 MG EC tablet Take 81 mg by mouth in the morning. Swallow whole.    [provider]  cetirizine (ZYRTEC) 10 MG tablet Take 10 mg by mouth in the morning, at noon, and at bedtime.    [provider]  Cholecalciferol (VITAMIN D-3 PO) Take 2,000 Units by mouth daily.    [provider]  clopidogrel (PLAVIX) 75 MG tablet Take 1 tablet (75 mg total) by mouth daily with breakfast. 02/09/23   Baglia, Corrina, PA-C  collagenase (SANTYL) 250 UNIT/GM ointment Apply 1 Application topically 2 (two) times a week. Applied to affected areas of toes/feet.    [provider]  collagenase (SANTYL) 250 UNIT/GM ointment Apply 1 Application topically daily. 02/17/23   Lewie Chamber, MD  ezetimibe (ZETIA) 10 MG tablet Take 1 tablet (10 mg total) by mouth daily. 02/09/23 02/09/24  Chuck Hint, MD  Famotidine (PEPCID AC PO) Take 20 mg by mouth at bedtime.    [provider]  ferrous sulfate 220 (44 Fe) MG/5ML solution Take 330 mg by mouth 3 (three) times a week. 12/27/22   [provider]  folic acid (FOLVITE) 1 MG tablet Take 1 mg by mouth at bedtime.    [provider]  insulin aspart (NOVOLOG FLEXPEN) 100 UNIT/ML FlexPen Inject 2-10 Units into the skin See admin instructions. Inject 2-10 units into the skin three times a day with meals, per sliding scale:  Breakfast: BGL 80-199 = 8 units; 200-299 = 9 units; 300 or greater = 10 units Lunch: BGL 80-199 = 5 units; 200-299 = 6 units; 300 or greater = 7 units Supper/evening meal: BGL 80-199 = 2 units; 200-299 = 3 units; 300 or greater = 4 units    [provider]  insulin detemir (LEVEMIR FLEXTOUCH) 100 UNIT/ML FlexPen Inject 3-7 Units into the skin See admin instructions. Inject 7 units into the skin in the morning and 3 units at bedtime    [provider]  levETIRAcetam (KEPPRA) 500 MG tablet Take 500 mg by mouth 2 (two) times daily. 01/11/23    [provider]  levothyroxine (SYNTHROID) 100 MCG tablet Take 1 tablet (100 mcg total) by mouth daily before breakfast. 09/12/22 02/03/25  Jerald Kief, MD  liver oil-zinc oxide (DESITIN) 40 % ointment Apply topically 3 (three) times daily. Patient taking differently: Apply 1 Application topically as needed for irritation. 09/16/21   Rolly Salter, MD  loperamide (IMODIUM A-D) 2 MG tablet Take 2 mg by mouth 4 (four) times daily as needed for diarrhea or loose stools.    [provider]  methotrexate (RHEUMATREX) 2.5 MG tablet Take 1 tablet (2.5 mg total) by mouth once a week. Caution:Chemotherapy. Protect from light. Patient taking differently: Take 12.5 mg by mouth once a week. Caution:Chemotherapy. Protect from light. 01/27/22   Marinda Elk, MD  metoprolol tartrate (LOPRESSOR) 25  MG tablet Take 1 tablet (25 mg total) by mouth 2 (two) times daily. Patient taking differently: Take 12.5 mg by mouth as needed (high blood pressure). 01/20/22   Marinda Elk, MD  midodrine (PROAMATINE) 5 MG tablet Take 0.5 tablets (2.5 mg total) by mouth 3 (three) times daily with meals. Patient taking differently: Take 2.5 mg by mouth 3 (three) times daily as needed (low blood pressure). 10/05/22   Osvaldo Shipper, MD  Multiple Vitamin (MULTIVITAMIN WITH MINERALS) TABS tablet Take 1 tablet by mouth in the morning.    [provider]  PLAQUENIL 200 MG tablet Take 200 mg by mouth at bedtime. 10/07/22   [provider]  polyethylene glycol (MIRALAX / GLYCOLAX) 17 g packet Take 17 g by mouth daily. Patient taking differently: Take 17 g by mouth daily as needed for mild constipation. 09/16/21   Rolly Salter, MD  predniSONE (DELTASONE) 5 MG tablet Take 5 mg by mouth daily with breakfast.    [provider]  triamcinolone ointment (KENALOG) 0.1 % Apply 1 Application topically 2 (two) times daily as needed (skin irritation). 07/16/22   [provider]     Physical Exam: Vitals:   02/24/23 1730 02/24/23 1901 02/24/23 2254 02/24/23 2330  BP: (!) 170/60   129/66  Pulse: (!) 59   68  Resp: 15   18  Temp:  98.7 F (37.1 C) 99 F (37.2 C)   TempSrc:  Oral Oral   SpO2: 98%   98%   Constitutional: NAD, calm, comfortable, chronically ill appearing thin frail elderly female laying in bed Eyes: lids and conjunctivae normal ENMT: Mucous membranes are moist.  Neck: normal, supple Respiratory: clear to auscultation bilaterally, no wheezing, no crackles. Normal respiratory effort. No accessory muscle use. Occasional productive cough.  Cardiovascular: Regular rate and rhythm, no murmurs / rubs / gallops. No extremity edema.  Abdomen: no tenderness Musculoskeletal: no clubbing / cyanosis. No joint deformity upper and lower extremities. Muscle wasting on all extremities  Skin:several small superficial erythematous pressure ulcers to right buttocks/sacral region. Small pinpoint ulcer to left lateral great toe. Small pinpoint SQ ulcer to right distal tip of great toe.  Neurologic: CN 2-12 grossly intact.  Psychiatric: Alert and oriented x 3. Normal mood.   Data Reviewed:  See HPI  Assessment and Plan: * COVID-19 -start Paxlovid  -not hypoxic -symptomatic managment  History of seizure -continue Keppra  Uncontrolled type 2 diabetes mellitus with hypoglycemia, with long-term current use of insulin (HCC) -BG of 50s  -hold any insulin or sliding scale  -last HbA1C of 8.4  Hypertension with hx of orthostatic hypotension -Continue midodrine  Hypothyroidism, acquired -continue levothyroxine -recent TSH last week was normal limits   Dermatopolymyositis, unspecified, organ involvement unspecified (HCC) -hold immunosuppressives for now with COVID -continue chronic prednisone  Dementia without behavioral disturbance (HCC) Pt alert and oriented to self, place and time on admission  Weakness -Secondary to COVID and recent hospitalization  for pneumonia  Malnutrition of moderate degree -ensure with meals  PAD (peripheral artery disease) (HCC) -s/p right common iliac artery stent, left common iliac stent on 7/22 -continue aspirin and plavix  Pressure injury of skin -stage 1 pressure injury to buttocks sacral region -has pinpoint ulcerations to left lateral great toe and distal end of right great toe      Advance Care Planning: DNR- on record and confirmed by daughter at bedside  Consults: none  Family Communication: daughter at bedside  Severity of Illness: The appropriate  patient status for this patient is OBSERVATION. Observation status is judged to be reasonable and necessary in order to provide the required intensity of service to ensure the patient's safety. The patient's presenting symptoms, physical exam findings, and initial radiographic and laboratory data in the context of their medical condition is felt to place them at decreased risk for further clinical deterioration. Furthermore, it is anticipated that the patient will be medically stable for discharge from the hospital within 2 midnights of admission.   Author: Anselm Jungling, DO 02/25/2023 12:16 AM  For on call review [email or web address].

## 2023-02-25 DIAGNOSIS — I152 Hypertension secondary to endocrine disorders: Secondary | ICD-10-CM | POA: Diagnosis not present

## 2023-02-25 DIAGNOSIS — E1159 Type 2 diabetes mellitus with other circulatory complications: Secondary | ICD-10-CM | POA: Diagnosis not present

## 2023-02-25 DIAGNOSIS — Z794 Long term (current) use of insulin: Secondary | ICD-10-CM | POA: Diagnosis not present

## 2023-02-25 DIAGNOSIS — E11649 Type 2 diabetes mellitus with hypoglycemia without coma: Secondary | ICD-10-CM | POA: Diagnosis not present

## 2023-02-25 DIAGNOSIS — E039 Hypothyroidism, unspecified: Secondary | ICD-10-CM | POA: Diagnosis not present

## 2023-02-25 DIAGNOSIS — U071 COVID-19: Secondary | ICD-10-CM | POA: Diagnosis not present

## 2023-02-25 LAB — CBG MONITORING, ED
Glucose-Capillary: 150 mg/dL — ABNORMAL HIGH (ref 70–99)
Glucose-Capillary: 204 mg/dL — ABNORMAL HIGH (ref 70–99)
Glucose-Capillary: 207 mg/dL — ABNORMAL HIGH (ref 70–99)
Glucose-Capillary: 238 mg/dL — ABNORMAL HIGH (ref 70–99)
Glucose-Capillary: 303 mg/dL — ABNORMAL HIGH (ref 70–99)
Glucose-Capillary: 313 mg/dL — ABNORMAL HIGH (ref 70–99)

## 2023-02-25 LAB — CBC
HCT: 22.4 % — ABNORMAL LOW (ref 36.0–46.0)
Hemoglobin: 7.1 g/dL — ABNORMAL LOW (ref 12.0–15.0)
MCH: 32.1 pg (ref 26.0–34.0)
MCHC: 31.7 g/dL (ref 30.0–36.0)
MCV: 101.4 fL — ABNORMAL HIGH (ref 80.0–100.0)
Platelets: 272 10*3/uL (ref 150–400)
RBC: 2.21 MIL/uL — ABNORMAL LOW (ref 3.87–5.11)
RDW: 14.4 % (ref 11.5–15.5)
WBC: 7 10*3/uL (ref 4.0–10.5)
nRBC: 0 % (ref 0.0–0.2)

## 2023-02-25 LAB — GLUCOSE, CAPILLARY: Glucose-Capillary: 230 mg/dL — ABNORMAL HIGH (ref 70–99)

## 2023-02-25 LAB — BASIC METABOLIC PANEL
Anion gap: 8 (ref 5–15)
BUN: 7 mg/dL — ABNORMAL LOW (ref 8–23)
CO2: 25 mmol/L (ref 22–32)
Calcium: 8 mg/dL — ABNORMAL LOW (ref 8.9–10.3)
Chloride: 104 mmol/L (ref 98–111)
Creatinine, Ser: 0.49 mg/dL (ref 0.44–1.00)
GFR, Estimated: >60 mL/min (ref >60)
Glucose, Bld: 133 mg/dL — ABNORMAL HIGH (ref 70–99)
Potassium: 3.1 mmol/L — ABNORMAL LOW (ref 3.5–5.1)
Sodium: 137 mmol/L (ref 135–145)

## 2023-02-25 MED ORDER — ENSURE ENLIVE PO LIQD
237.0000 mL | Freq: Two times a day (BID) | ORAL | Status: DC
  Administered 2023-02-26 – 2023-03-01 (×7): 237 mL via ORAL
  Filled 2023-02-25 (×2): qty 237

## 2023-02-25 MED ORDER — SODIUM CHLORIDE 0.9 % IV SOLN: INTRAVENOUS | Status: DC

## 2023-02-25 MED ORDER — NYSTATIN 100000 UNIT/ML MT SUSP
5.0000 mL | Freq: Four times a day (QID) | OROMUCOSAL | Status: DC
  Administered 2023-02-25 – 2023-03-01 (×16): 500000 [IU] via ORAL
  Filled 2023-02-25 (×15): qty 5

## 2023-02-25 MED ORDER — INSULIN ASPART 100 UNIT/ML IJ SOLN
2.0000 [IU] | Freq: Three times a day (TID) | INTRAMUSCULAR | Status: DC
  Administered 2023-02-25 (×3): 2 [IU] via SUBCUTANEOUS
  Filled 2023-02-25: qty 0.02

## 2023-02-25 MED ORDER — INSULIN ASPART 100 UNIT/ML IJ SOLN
3.0000 [IU] | Freq: Three times a day (TID) | INTRAMUSCULAR | Status: DC
  Administered 2023-02-26 – 2023-03-01 (×8): 3 [IU] via SUBCUTANEOUS
  Filled 2023-02-25: qty 0.03

## 2023-02-25 MED ORDER — FLUCONAZOLE 100MG IVPB
100.0000 mg | INTRAVENOUS | Status: DC
Start: 2023-02-26 — End: 2023-02-25

## 2023-02-25 MED ORDER — INSULIN ASPART 100 UNIT/ML IJ SOLN
0.0000 [IU] | Freq: Three times a day (TID) | INTRAMUSCULAR | Status: DC
  Administered 2023-02-25 (×2): 4 [IU] via SUBCUTANEOUS
  Filled 2023-02-25: qty 0.06

## 2023-02-25 MED ORDER — ENOXAPARIN SODIUM 40 MG/0.4ML IJ SOSY
40.0000 mg | PREFILLED_SYRINGE | INTRAMUSCULAR | Status: DC
  Administered 2023-02-25 – 2023-03-01 (×5): 40 mg via SUBCUTANEOUS
  Filled 2023-02-25 (×5): qty 0.4

## 2023-02-25 MED ORDER — INSULIN ASPART 100 UNIT/ML IJ SOLN
0.0000 [IU] | Freq: Three times a day (TID) | INTRAMUSCULAR | Status: DC
  Administered 2023-02-26: 3 [IU] via SUBCUTANEOUS
  Administered 2023-02-26: 5 [IU] via SUBCUTANEOUS
  Administered 2023-02-27: 3 [IU] via SUBCUTANEOUS
  Administered 2023-02-27: 7 [IU] via SUBCUTANEOUS
  Administered 2023-02-27: 2 [IU] via SUBCUTANEOUS
  Administered 2023-02-28: 9 [IU] via SUBCUTANEOUS
  Administered 2023-02-28: 3 [IU] via SUBCUTANEOUS
  Administered 2023-03-01: 7 [IU] via SUBCUTANEOUS
  Filled 2023-02-25: qty 0.09

## 2023-02-25 MED ORDER — SODIUM CHLORIDE 0.9 % IV SOLN: INTRAVENOUS | Status: AC

## 2023-02-25 MED ORDER — FLUCONAZOLE 100 MG PO TABS
100.0000 mg | ORAL_TABLET | Freq: Every day | ORAL | Status: DC
  Administered 2023-02-26 – 2023-03-01 (×4): 100 mg via ORAL
  Filled 2023-02-25 (×4): qty 1

## 2023-02-25 MED ORDER — INSULIN ASPART 100 UNIT/ML IJ SOLN
0.0000 [IU] | Freq: Every day | INTRAMUSCULAR | Status: DC
  Administered 2023-02-25: 2 [IU] via SUBCUTANEOUS
  Filled 2023-02-25: qty 0.05

## 2023-02-25 MED ORDER — NIRMATRELVIR/RITONAVIR (PAXLOVID)TABLET: 3.0000 | ORAL_TABLET | Freq: Two times a day (BID) | ORAL | Status: DC

## 2023-02-25 MED ORDER — NIRMATRELVIR/RITONAVIR (PAXLOVID) TABLET (RENAL DOSING)
2.0000 | ORAL_TABLET | Freq: Two times a day (BID) | ORAL | Status: AC
  Administered 2023-02-25 – 2023-02-27 (×7): 2 via ORAL
  Filled 2023-02-25: qty 20

## 2023-02-25 MED ORDER — INSULIN DETEMIR 100 UNIT/ML ~~LOC~~ SOLN
5.0000 [IU] | Freq: Two times a day (BID) | SUBCUTANEOUS | Status: DC
  Administered 2023-02-25: 5 [IU] via SUBCUTANEOUS
  Filled 2023-02-25 (×2): qty 0.05

## 2023-02-25 MED ORDER — FLUCONAZOLE IN SODIUM CHLORIDE 200-0.9 MG/100ML-% IV SOLN
200.0000 mg | Freq: Once | INTRAVENOUS | Status: AC
  Administered 2023-02-25: 200 mg via INTRAVENOUS
  Filled 2023-02-25: qty 100

## 2023-02-25 NOTE — Progress Notes (Addendum)
[facility] PROGRESS NOTE    Progress Note  [name] [name]  ZOX:[id number] DOB: Sep 12, 1933 DOA: 02/24/2023 PCP: [name], MD     Brief Narrative:   [name] [name] is an 89 y.o. female past medical history significant for dementia, on steroids and Plaquenil for dermatomyositis chronic diastolic heart failure, insulin-dependent diabetes mellitus type 2, history of CVA chronic kidney disease stage II, recently discharged from the hospital for aspiration pneumonia last week he completed a course of antibiotics comes in with weakness, most of the history was obtained from the daughter relates she has had a productive cough, she requires assistance for transfers and her ADLs mostly bedbound, was COVID-positive in the ED   Assessment/Plan:   COVID-19 She is not hypoxic steroids were not started. She was started on Paxil bid. She has remained afebrile, with no leukocytosis. Continue conservative management.  Recurrent Oral thrush: Start Diflucan and oral nystatin.  Uncontrolled type 2 diabetes mellitus with hypoglycemia, with long-term current use  of insulin (HCC) Hold all oral hypoglycemic agents. Her last A1c was 8.4. Episode of hypoglycemia was given orange juice and a cane right up.  Hypertension with hx of orthostatic hypotension Continue midodrine.  Hypothyroidism, acquired Continue Synthroid.  Dermatopolymyositis, unspecified, organ involvement unspecified (HCC) Hold immunosuppressive therapy Plaquenil in the setting of COVID. Continue chronic prednisone.  Generalized weakness: Multifactorial due to recent pneumonia and new COVID-19.  Moderate protein caloric malnutrition: Continue Ensure with meals.  History of seizure Continue Keppra.  Peripheral arterial disease: Continue aspirin and Plavix.  Stage I sacral decubitus ulcer present on admission: Turn patient every 2 hours and try to mobilize out of bed to chair. Consult physical therapy. RN  Pressure Injury Documentation: Pressure Injury 10/02/22 Coccyx Medial Stage 2 -  Partial thickness loss of dermis presenting as a shallow open injury with a red, pink wound bed without slough. (Active)  10/02/22 2055  Location: Coccyx  Location Orientation: Medial  Staging: Stage 2 -  Partial thickness loss of dermis presenting as a shallow open injury with a red, pink wound bed without slough.  Wound Description (Comments):   Present on Admission: Yes     Pressure Injury 10/02/22 Heel Left;Right Stage 1 -  Intact skin with non-blanchable redness of a localized area usually over a bony prominence. Bilateral heel redness, prophylactic drsg applied, heels floating (Active)  10/02/22 2055  Location: Heel  Location Orientation: Left;Right  Staging: Stage 1 -  Intact skin with non-blanchable redness of a localized area usually over a bony prominence.  Wound Description (Comments): Bilateral heel redness, prophylactic drsg applied, heels floating  Present on Admission: Yes     Pressure Injury 10/02/22 Knee Anterior;Left Stage 2 -  Partial thickness loss of dermis presenting as a shallow open injury with a red, pink wound bed without slough. 2 open areas above left knee (Active)  10/02/22 2055  Location: Knee  Location Orientation: Anterior;Left  Staging: Stage 2 -  Partial thickness loss of dermis presenting as a shallow open injury with a red, pink wound bed without slough.  Wound Description (Comments): 2 open areas above left knee  Present on Admission: Yes     Pressure Injury 10/02/22 Toe (Comment  which one) Anterior;Right Stage 1 -  Intact skin with non-blanchable redness of a localized area usually over a bony prominence. right bunion redness (Active)  10/02/22 2055  Location: Toe (Comment  which one)  Location Orientation: Anterior;Right  Staging: Stage 1 -  Intact skin with non-blanchable  redness of a localized area usually over a bony prominence.  Wound Description (Comments): right  bunion redness  Present on Admission: Yes     Pressure Injury 10/02/22 Back Left;Upper Stage 1 -  Intact skin with non-blanchable redness of a localized area usually over a bony prominence. erythema to bony prominence of left shoulder blade (Active)  10/02/22 2055  Location: Back  Location Orientation: Left;Upper  Staging: Stage 1 -  Intact skin with non-blanchable redness of a localized area usually over a bony prominence.  Wound Description (Comments): erythema to bony prominence of left shoulder blade  Present on Admission: Yes    DVT prophylaxis: lovenox Family Communication:daughter Status is: Observation The patient remains OBS appropriate and will d/c before 2 midnights.    Code Status:     Code Status Orders  (From admission, onward)           Start     Ordered   02/25/23 0006  Do not attempt resuscitation (DNR)  Continuous       Question Answer Comment  If patient has no pulse and is not breathing Do Not Attempt Resuscitation   If patient has a pulse and/or is breathing: Medical Treatment Goals LIMITED ADDITIONAL INTERVENTIONS: Use medication/IV fluids and cardiac monitoring as indicated; Do not use intubation or mechanical ventilation (DNI), also provide comfort medications.  Transfer to Progressive/Stepdown as indicated, avoid Intensive Care.   Consent: Discussion documented in EHR or advanced directives reviewed      02/25/23 0006           Code Status History     Date Active Date Inactive Code Status Order ID Comments User Context   02/15/2023 0120 02/17/2023 2007 DNR [id number]  Darlin Drop, DO ED   02/08/2023 1730 02/09/2023 1537 Full Code [id number]  Chuck Hint, MD Inpatient   10/02/2022 1929 10/05/2022 1859 DNR [id number]  Orland Mustard, MD ED   09/10/2022 1409 09/11/2022 2127 DNR [id number]  Emeline General, MD ED   07/27/2022 2317 07/30/2022 2004 DNR [id number]  Charlsie Quest, MD ED   05/09/2022 2153 05/13/2022 2041 Full Code [id number]  Darlin Drop, DO ED   01/13/2022 1556 01/20/2022 1935 DNR [id number]  Teddy Spike, DO Inpatient   09/13/2021 1921 09/16/2021 1758 DNR [id number]  Rolly Salter, MD ED   09/13/2021 1901 09/13/2021 1921 Full Code [id number]  Rolly Salter, MD ED   10/08/2019 1040 10/12/2019 1639 Full Code [id number]  Arline Asp, NP ED   08/21/2019 1118 08/23/2019 2332 Full Code [id number]  Clydie Braun, MD ED   08/18/2019 0602 08/20/2019 1748 Full Code [id number]  John Giovanni, MD Inpatient   11/26/2016 0048 12/09/2016 1943 Full Code [id number]  Alberteen Sam, MD Inpatient   06/10/2016 1355 06/11/2016 1537 Full Code [id number]  Marcos Eke, PA-C ED         IV Access:   Peripheral IV   Procedures and diagnostic studies:   CT HEAD WO CONTRAST ( )  Result Date: 02/24/2023 CLINICAL DATA:  Head trauma, minor (Age >= 65y) Mental status change, unknown cause EXAM: CT HEAD WITHOUT CONTRAST TECHNIQUE: Contiguous axial images were obtained from the base of the skull through the vertex without intravenous contrast. RADIATION DOSE REDUCTION: This exam was performed according to the departmental dose-optimization program which includes automated exposure control, adjustment of the mA and/or kV according to patient size and/or use of iterative reconstruction technique. COMPARISON:  02/14/2023 FINDINGS: Brain: Mild parenchymal volume loss  is commensurate with the patient's age. Moderate bilateral periventricular and deep white matter changes are present likely reflecting the sequela of small vessel ischemia. These appear stable since prior examination. Remote bilateral thalamic lacunar infarcts again noted. No evidence of acute intracranial hemorrhage or infarct. No abnormal mass effect or midline shift. No abnormal intra or extra-axial mass lesion or fluid collection. Ventricular size is normal. Cerebellum is unremarkable. Vascular: No hyperdense vessel or unexpected calcification. Skull: Normal. Negative for  fracture or focal lesion. Sinuses/Orbits: No acute finding. Other: Mastoid air cells and middle ear cavities are clear. IMPRESSION: 1. No acute intracranial hemorrhage or infarct. 2. Stable senescent change. 3. Remote bilateral thalamic lacunar infarcts. Electronically Signed   By: Helyn Numbers M.D.   On: 02/24/2023 20:16   DG Chest Port 1 View  Result Date: 02/24/2023 CLINICAL DATA:  Altered mental status EXAM: PORTABLE CHEST 1 VIEW COMPARISON:  02/14/2023 x-ray FINDINGS: Hyperinflation. No consolidation, pneumothorax or effusion. No edema. Normal cardiopericardial silhouette with a calcified aorta. The previous opacity left lung base is no longer seen. Loop recorder overlying the lower left thorax. IMPRESSION: Improved left lung base opacity. Hyperinflation.  No effusion or edema. Loop recorder Electronically Signed   By: Karen Kays M.D.   On: 02/24/2023 17:11     Medical Consultants:   None.   Subjective:    [name] [name] relates feels weakness  Objective:    Vitals:   02/24/23 2330 02/25/23 0215 02/25/23 0248 02/25/23 0515  BP: 129/66 (!) 162/65  (!) 141/61  Pulse: 68 63  67  Resp: 18 15  16   Temp:   98.7 F (37.1 C)   TempSrc:   Oral   SpO2: 98% 100%  96%   SpO2: 96 %  No intake or output data in the 24 hours ending 02/25/23 0657 There were no vitals filed for this visit.  Exam: General exam: In no acute distress. Respiratory system: Good air movement and clear to auscultation. Cardiovascular system: S1 & S2 heard, RRR. No JVD. Gastrointestinal system: Abdomen is nondistended, soft and nontender.  Extremities: No pedal edema. Skin: No rashes, lesions or ulcers Psychiatry: Judgement and insight appear normal. Mood & affect appropriate.    Data Reviewed:    Labs: Basic Metabolic Panel: Recent Labs  Lab 02/24/23 1454  NA 133*  K 4.3  CL 96*  CO2 27  GLUCOSE 180*  BUN 10  CREATININE 0.81  CALCIUM 9.2   GFR Estimated Creatinine Clearance: 43.1  mL/min (by C-G formula based on SCr of 0.81 mg/dL). Liver Function Tests: Recent Labs  Lab 02/24/23 1630  AST 35  ALT 22  ALKPHOS 68  BILITOT 0.6  PROT 6.6  ALBUMIN 3.0*   No results for input(s): "LIPASE", "AMYLASE" in the last 168 hours. No results for input(s): "AMMONIA" in the last 168 hours. Coagulation profile No results for input(s): "INR", "PROTIME" in the last 168 hours. COVID-19 Labs  No results for input(s): "DDIMER", "FERRITIN", "LDH", "CRP" in the last 72 hours.  Lab Results  Component Value Date   SARSCOV2NAA POSITIVE (A) 02/24/2023   SARSCOV2NAA NEGATIVE 02/14/2023   SARSCOV2NAA NEGATIVE 01/04/2023   SARSCOV2NAA NEGATIVE 10/02/2022    CBC: Recent Labs  Lab 02/24/23 1454  WBC 8.1  HGB 8.6*  HCT 26.6*  MCV 99.6  PLT 456*   Cardiac Enzymes: No results for input(s): "CKTOTAL", "CKMB", "CKMBINDEX", "TROPONINI" in the last 168 hours. BNP (last 3 results) No results for input(s): "PROBNP" in the last 8760 hours.  CBG: Recent Labs  Lab 02/24/23 1454 02/24/23 2329 02/25/23 0034 02/25/23 0214  GLUCAP 174* 51* 207* 204*   D-Dimer: No results for input(s): "DDIMER" in the last 72 hours. Hgb A1c: No results for input(s): "HGBA1C" in the last 72 hours. Lipid Profile: No results for input(s): "CHOL", "HDL", "LDLCALC", "TRIG", "CHOLHDL", "LDLDIRECT" in the last 72 hours. Thyroid function studies: No results for input(s): "TSH", "T4TOTAL", "T3FREE", "THYROIDAB" in the last 72 hours.  Invalid input(s): "FREET3" Anemia work up: No results for input(s): "VITAMINB12", "FOLATE", "FERRITIN", "TIBC", "IRON", "RETICCTPCT" in the last 72 hours. Sepsis Labs: Recent Labs  Lab 02/24/23 1454  WBC 8.1   Microbiology Recent Results (from the past 240 hour(s))  SARS Coronavirus 2 by RT PCR (hospital order, performed in [facility] hospital lab) *cepheid single result test* Anterior Nasal Swab     Status: Abnormal   Collection Time: 02/24/23  4:26 PM   Specimen:  Anterior Nasal Swab  Result Value Ref Range Status   SARS Coronavirus 2 by RT PCR POSITIVE (A) NEGATIVE Final    Comment: (NOTE) SARS-CoV-2 target nucleic acids are DETECTED  SARS-CoV-2 RNA is generally detectable in upper respiratory specimens  during the acute phase of infection.  Positive results are indicative  of the presence of the identified virus, but do not rule out bacterial infection or co-infection with other pathogens not detected by the test.  Clinical correlation with patient history and  other diagnostic information is necessary to determine patient infection status.  The expected result is negative.  Fact Sheet for Patients:   RoadLapTop.co.za   Fact Sheet for Healthcare Providers:   http://kim-miller.com/    This test is not yet approved or cleared by the [place] FDA and  has been authorized for detection and/or diagnosis of SARS-CoV-2 by FDA under an Emergency Use Authorization (EUA).  This EUA will remain in effect (meaning this test can be used) for the duration of  the COVID-19 declaration under Section 564(b)(1)  of the Act, 21 U.S.C. section 360-bbb-3(b)(1), unless the authorization is terminated or revoked sooner.   Performed at [facility], [address], [place], [place] [address]      Medications:    aspirin EC  81 mg Oral Daily   clopidogrel  75 mg Oral Q breakfast   enoxaparin (LOVENOX) injection  40 mg Subcutaneous Q24H   ezetimibe  10 mg Oral Daily   famotidine  20 mg Oral QHS   feeding supplement  237 mL Oral BID BM   [START ON 02/26/2023] ferrous sulfate  330 mg Oral Once per day on Monday Wednesday Friday   folic acid  1 mg Oral QHS   levETIRAcetam  500 mg Oral BID   levothyroxine  100 mcg Oral Q0600   loratadine  10 mg Oral Daily   midodrine  2.5 mg Oral TID WC   nirmatrelvir/ritonavir (renal dosing)  2 tablet Oral BID   predniSONE  5 mg Oral Q breakfast    Continuous Infusions:  sodium chloride 75 mL/hr at 02/25/23 0204      LOS: 0 days   Marinda Elk  [facility]  02/25/2023, 6:57 AM

## 2023-02-25 NOTE — Assessment & Plan Note (Signed)
-  stage 1 pressure injury to buttocks sacral region -has pinpoint ulcerations to left lateral great toe and distal end of right great toe

## 2023-02-25 NOTE — Assessment & Plan Note (Signed)
-  ensure with meals

## 2023-02-25 NOTE — ED Notes (Signed)
ED TO INPATIENT HANDOFF REPORT  Name/Age/Gender [name] [name] 89 y.o. female  Code Status    Code Status Orders  (From admission, onward)           Start     Ordered   02/25/23 0006  Do not attempt resuscitation (DNR)  Continuous       Question Answer Comment  If patient has no pulse and is not breathing Do Not Attempt Resuscitation   If patient has a pulse and/or is breathing: Medical Treatment Goals LIMITED ADDITIONAL INTERVENTIONS: Use medication/IV fluids and cardiac monitoring as indicated; Do not use intubation or mechanical ventilation (DNI), also provide comfort medications.  Transfer to Progressive/Stepdown as indicated, avoid Intensive Care.   Consent: Discussion documented in EHR or advanced directives reviewed      02/25/23 0006           Code Status History     Date Active Date Inactive Code Status Order ID Comments User Context   02/15/2023 0120 02/17/2023 2007 DNR [id number]  Darlin Drop, DO ED   02/08/2023 1730 02/09/2023 1537 Full Code [id number]  Chuck Hint, MD Inpatient   10/02/2022 1929 10/05/2022 1859 DNR [id number]  Orland Mustard, MD ED   09/10/2022 1409 09/11/2022 2127 DNR [id number]  Emeline General, MD ED   07/27/2022 2317 07/30/2022 2004 DNR [id number]  Charlsie Quest, MD ED   05/09/2022 2153 05/13/2022 2041 Full Code [id number]  Darlin Drop, DO ED   01/13/2022 1556 01/20/2022 1935 DNR [id number]  Teddy Spike, DO Inpatient   09/13/2021 1921 09/16/2021 1758 DNR [id number]  Rolly Salter, MD ED   09/13/2021 1901 09/13/2021 1921 Full Code [id number]  Rolly Salter, MD ED   10/08/2019 1040 10/12/2019 1639 Full Code [id number]  Arline Asp, NP ED   08/21/2019 1118 08/23/2019 2332 Full Code [id number]  Clydie Braun, MD ED   08/18/2019 0602 08/20/2019 1748 Full Code [id number]  John Giovanni, MD Inpatient   11/26/2016 0048 12/09/2016 1943 Full Code [id number]  Alberteen Sam, MD Inpatient   06/10/2016 1355 06/11/2016 1537 Full Code  [id number]  [facility] ED       Home/SNF/Other Home  Chief Complaint COVID-19 [U07.1]  Level of Care/Admitting Diagnosis ED Disposition     ED Disposition  Admit   Condition  --   Comment  Hospital Area: [facility] [[id number]]  Level of Care: Med-Surg [16]  May place patient in observation at [facility] or [facility] if equivalent level of care is available:: No  Covid Evaluation: Asymptomatic - no recent exposure (last 10 days) testing not required  Diagnosis: COVID-19 [[id number]]  Admitting Physician: Anselm Jungling [[id number]]  Attending Physician: Anselm Jungling [[id number]]          Medical History Past Medical History:  Diagnosis Date   Arthritis    "knees, legs" (06/10/2016)   Ascites    Chronic kidney disease    "related to my diabetes"   Chronic lower back pain    Diabetes mellitus without complication (HCC)    Eczema    GERD (gastroesophageal reflux disease)    High cholesterol    Hyperlipidemia    Hypertension    Hypothyroidism    OAB (overactive bladder)    Osteoporosis    Thyroid disease    Type II diabetes mellitus (HCC)    Urticaria     Allergies Allergies  Allergen Reactions   Nsaids Other (See Comments)  No NSAIDS due to kidney function- ESPECIALLY ibuprofen or Aleve   Biaxin [Clarithromycin] Hives   Statins Other (See Comments)    Muscle pain (myositis per daugher)   Bactrim [Sulfamethoxazole-Trimethoprim] Hives and Rash   Lisinopril Cough   Sulfa Antibiotics Hives, Rash and Other (See Comments)    NO sulfa-based meds!!     IV Location/Drains/Wounds Patient Lines/Drains/Airways Status     Active Line/Drains/Airways     Name Placement date Placement time Site Days   Peripheral IV 02/24/23 20 G Anterior;Left;Proximal Forearm 02/24/23  1600  Forearm  1   External Urinary Catheter 02/24/23  1750  --  1   Pressure Injury 10/02/22 Coccyx Medial Stage 2 -  Partial thickness loss of dermis presenting as a  shallow open injury with a red, pink wound bed without slough. 10/02/22  2055  -- 146   Pressure Injury 10/02/22 Heel Left;Right Stage 1 -  Intact skin with non-blanchable redness of a localized area usually over a bony prominence. Bilateral heel redness, prophylactic drsg applied, heels floating 10/02/22  2055  -- 146   Pressure Injury 10/02/22 Knee Anterior;Left Stage 2 -  Partial thickness loss of dermis presenting as a shallow open injury with a red, pink wound bed without slough. 2 open areas above left knee 10/02/22  2055  -- 146   Pressure Injury 10/02/22 Toe (Comment  which one) Anterior;Right Stage 1 -  Intact skin with non-blanchable redness of a localized area usually over a bony prominence. right bunion redness 10/02/22  2055  -- 146   Pressure Injury 10/02/22 Back Left;Upper Stage 1 -  Intact skin with non-blanchable redness of a localized area usually over a bony prominence. erythema to bony prominence of left shoulder blade 10/02/22  2055  -- 146   Wound / Incision (Open or Dehisced) 09/10/22 Toe (Comment  which one) Anterior;Right sore on tip of big toe 09/10/22  1725  Toe (Comment  which one)  168   Wound / Incision (Open or Dehisced) 09/10/22 Toe (Comment  which one) Anterior;Left Wound on tip of big toe 09/10/22  1725  Toe (Comment  which one)  168   Wound / Incision (Open or Dehisced) 09/10/22 Foot Anterior;Left "bunion" per family, with open area 09/10/22  1725  Foot  168   Wound / Incision (Open or Dehisced) 09/10/22 Coccyx 09/10/22  1725  Coccyx  168            Labs/Imaging Results for orders placed or performed during the hospital encounter of 02/24/23 (from the past 48 hour(s))  Basic metabolic panel     Status: Abnormal   Collection Time: 02/24/23  2:54 PM  Result Value Ref Range   Sodium 133 (L) 135 - 145 mmol/L   Potassium 4.3 3.5 - 5.1 mmol/L    Comment: HEMOLYSIS AT THIS LEVEL MAY AFFECT RESULT   Chloride 96 (L) 98 - 111 mmol/L   CO2 27 22 - 32 mmol/L    Glucose, Bld 180 (H) 70 - 99 mg/dL    Comment: Glucose reference range applies only to samples taken after fasting for at least 8 hours.   BUN 10 8 - 23 mg/dL   Creatinine, Ser 1.61 0.44 - 1.00 mg/dL   Calcium 9.2 8.9 - 09.6 mg/dL   GFR, Estimated >04 >54 mL/min    Comment: (NOTE) Calculated using the CKD-EPI Creatinine Equation (2021)    Anion gap 10 5 - 15    Comment: Performed at [facility],  [address], [place], [place] [address]  CBC     Status: Abnormal   Collection Time: 02/24/23  2:54 PM  Result Value Ref Range   WBC 8.1 4.0 - 10.5 K/uL   RBC 2.67 (L) 3.87 - 5.11 MIL/uL   Hemoglobin 8.6 (L) 12.0 - 15.0 g/dL   HCT 43.3 (L) 29.5 - 18.8 %   MCV 99.6 80.0 - 100.0 fL   MCH 32.2 26.0 - 34.0 pg   MCHC 32.3 30.0 - 36.0 g/dL   RDW 41.6 60.6 - 30.1 %   Platelets 456 (H) 150 - 400 K/uL   nRBC 0.0 0.0 - 0.2 %    Comment: Performed at [facility], [address] [place], [place] [address]  CBG monitoring, ED     Status: Abnormal   Collection Time: 02/24/23  2:54 PM  Result Value Ref Range   Glucose-Capillary 174 (H) 70 - 99 mg/dL    Comment: Glucose reference range applies only to samples taken after fasting for at least 8 hours.  SARS Coronavirus 2 by RT PCR (hospital order, performed in [facility] hospital lab) *cepheid single result test* Anterior Nasal Swab     Status: Abnormal   Collection Time: 02/24/23  4:26 PM   Specimen: Anterior Nasal Swab  Result Value Ref Range   SARS Coronavirus 2 by RT PCR POSITIVE (A) NEGATIVE    Comment: (NOTE) SARS-CoV-2 target nucleic acids are DETECTED  SARS-CoV-2 RNA is generally detectable in upper respiratory specimens  during the acute phase of infection.  Positive results are indicative  of the presence of the identified virus, but do not rule out bacterial infection or co-infection with other pathogens not detected by the test.  Clinical correlation with patient history and  other diagnostic  information is necessary to determine patient infection status.  The expected result is negative.  Fact Sheet for Patients:   RoadLapTop.co.za   Fact Sheet for Healthcare Providers:   http://kim-miller.com/    This test is not yet approved or cleared by the [place] FDA and  has been authorized for detection and/or diagnosis of SARS-CoV-2 by FDA under an Emergency Use Authorization (EUA).  This EUA will remain in effect (meaning this test can be used) for the duration of  the COVID-19 declaration under Section 564(b)(1)  of the Act, 21 U.S.C. section 360-bbb-3(b)(1), unless the authorization is terminated or revoked sooner.   Performed at [facility], [address] [place], [place] [address]   Hepatic function panel     Status: Abnormal   Collection Time: 02/24/23  4:30 PM  Result Value Ref Range   Total Protein 6.6 6.5 - 8.1 g/dL   Albumin 3.0 (L) 3.5 - 5.0 g/dL   AST 35 15 - 41 U/L    Comment: HEMOLYSIS AT THIS LEVEL MAY AFFECT RESULT   ALT 22 0 - 44 U/L    Comment: HEMOLYSIS AT THIS LEVEL MAY AFFECT RESULT   Alkaline Phosphatase 68 38 - 126 U/L   Total Bilirubin 0.6 0.3 - 1.2 mg/dL    Comment: HEMOLYSIS AT THIS LEVEL MAY AFFECT RESULT   Bilirubin, Direct 0.1 0.0 - 0.2 mg/dL   Indirect Bilirubin 0.5 0.3 - 0.9 mg/dL    Comment: Performed at [facility], [address] [place], [place] [address]  Urinalysis, Routine w reflex microscopic -Urine, Catheterized     Status: Abnormal   Collection Time: 02/24/23  7:20 PM  Result Value Ref Range  Color, Urine STRAW (A) YELLOW   APPearance CLEAR CLEAR   Specific Gravity, Urine 1.004 (L) 1.005 - 1.030   pH 8.0 5.0 - 8.0   Glucose, UA NEGATIVE NEGATIVE mg/dL   Hgb urine dipstick NEGATIVE NEGATIVE   Bilirubin Urine NEGATIVE NEGATIVE   Ketones, ur NEGATIVE NEGATIVE mg/dL   Protein, ur NEGATIVE NEGATIVE mg/dL   Nitrite NEGATIVE NEGATIVE    Leukocytes,Ua NEGATIVE NEGATIVE    Comment: Performed at [facility], [address] [place], [place] [address]  CBG monitoring, ED     Status: Abnormal   Collection Time: 02/24/23 11:29 PM  Result Value Ref Range   Glucose-Capillary 51 (L) 70 - 99 mg/dL    Comment: Glucose reference range applies only to samples taken after fasting for at least 8 hours.  CBG monitoring, ED     Status: Abnormal   Collection Time: 02/25/23 12:34 AM  Result Value Ref Range   Glucose-Capillary 207 (H) 70 - 99 mg/dL    Comment: Glucose reference range applies only to samples taken after fasting for at least 8 hours.  CBG monitoring, ED     Status: Abnormal   Collection Time: 02/25/23  2:14 AM  Result Value Ref Range   Glucose-Capillary 204 (H) 70 - 99 mg/dL    Comment: Glucose reference range applies only to samples taken after fasting for at least 8 hours.  CBC     Status: Abnormal   Collection Time: 02/25/23  7:02 AM  Result Value Ref Range   WBC 7.0 4.0 - 10.5 K/uL   RBC 2.21 (L) 3.87 - 5.11 MIL/uL   Hemoglobin 7.1 (L) 12.0 - 15.0 g/dL   HCT 69.6 (L) 29.5 - 28.4 %   MCV 101.4 (H) 80.0 - 100.0 fL   MCH 32.1 26.0 - 34.0 pg   MCHC 31.7 30.0 - 36.0 g/dL   RDW 13.2 44.0 - 10.2 %   Platelets 272 150 - 400 K/uL   nRBC 0.0 0.0 - 0.2 %    Comment: Performed at [facility], [address] [place], [place] [address]  Basic metabolic panel     Status: Abnormal   Collection Time: 02/25/23  7:02 AM  Result Value Ref Range   Sodium 137 135 - 145 mmol/L   Potassium 3.1 (L) 3.5 - 5.1 mmol/L   Chloride 104 98 - 111 mmol/L   CO2 25 22 - 32 mmol/L   Glucose, Bld 133 (H) 70 - 99 mg/dL    Comment: Glucose reference range applies only to samples taken after fasting for at least 8 hours.   BUN 7 (L) 8 - 23 mg/dL   Creatinine, Ser 6.44 0.44 - 1.00 mg/dL   Calcium 8.0 (L) 8.9 - 10.3 mg/dL   GFR, Estimated >03 >47 mL/min    Comment: (NOTE) Calculated using the CKD-EPI  Creatinine Equation (2021)    Anion gap 8 5 - 15    Comment: Performed at [facility], [address], [place], [place] [address]  CBG monitoring, ED     Status: Abnormal   Collection Time: 02/25/23  8:12 AM  Result Value Ref Range   Glucose-Capillary 150 (H) 70 - 99 mg/dL    Comment: Glucose reference range applies only to samples taken after fasting for at least 8 hours.  CBG monitoring, ED     Status: Abnormal   Collection Time: 02/25/23 12:34 PM  Result Value Ref Range   Glucose-Capillary 313 (H) 70 -  99 mg/dL    Comment: Glucose reference range applies only to samples taken after fasting for at least 8 hours.  CBG monitoring, ED     Status: Abnormal   Collection Time: 02/25/23  4:02 PM  Result Value Ref Range   Glucose-Capillary 303 (H) 70 - 99 mg/dL    Comment: Glucose reference range applies only to samples taken after fasting for at least 8 hours.   CT HEAD WO CONTRAST ( )  Result Date: 02/24/2023 CLINICAL DATA:  Head trauma, minor (Age >= 65y) Mental status change, unknown cause EXAM: CT HEAD WITHOUT CONTRAST TECHNIQUE: Contiguous axial images were obtained from the base of the skull through the vertex without intravenous contrast. RADIATION DOSE REDUCTION: This exam was performed according to the departmental dose-optimization program which includes automated exposure control, adjustment of the mA and/or kV according to patient size and/or use of iterative reconstruction technique. COMPARISON:  02/14/2023 FINDINGS: Brain: Mild parenchymal volume loss is commensurate with the patient's age. Moderate bilateral periventricular and deep white matter changes are present likely reflecting the sequela of small vessel ischemia. These appear stable since prior examination. Remote bilateral thalamic lacunar infarcts again noted. No evidence of acute intracranial hemorrhage or infarct. No abnormal mass effect or midline shift. No abnormal intra or extra-axial mass lesion or  fluid collection. Ventricular size is normal. Cerebellum is unremarkable. Vascular: No hyperdense vessel or unexpected calcification. Skull: Normal. Negative for fracture or focal lesion. Sinuses/Orbits: No acute finding. Other: Mastoid air cells and middle ear cavities are clear. IMPRESSION: 1. No acute intracranial hemorrhage or infarct. 2. Stable senescent change. 3. Remote bilateral thalamic lacunar infarcts. Electronically Signed   By: Helyn Numbers M.D.   On: 02/24/2023 20:16   DG Chest Port 1 View  Result Date: 02/24/2023 CLINICAL DATA:  Altered mental status EXAM: PORTABLE CHEST 1 VIEW COMPARISON:  02/14/2023 x-ray FINDINGS: Hyperinflation. No consolidation, pneumothorax or effusion. No edema. Normal cardiopericardial silhouette with a calcified aorta. The previous opacity left lung base is no longer seen. Loop recorder overlying the lower left thorax. IMPRESSION: Improved left lung base opacity. Hyperinflation.  No effusion or edema. Loop recorder Electronically Signed   By: Karen Kays M.D.   On: 02/24/2023 17:11    Pending Labs Unresulted Labs (From admission, onward)     Start     Ordered   02/25/23 1702  Hemoglobin A1c  Once,   R       Comments: To assess prior glycemic control    02/25/23 1702            Vitals/Pain Today's Vitals   02/25/23 1300 02/25/23 1308 02/25/23 1500 02/25/23 1600  BP: (!) 175/58  (!) 149/61 (!) 153/86  Pulse: 63  78 89  Resp: 15  19 (!) 26  Temp:  98.8 F (37.1 C)  98.1 F (36.7 C)  TempSrc:  Oral  Oral  SpO2: 100%  98% 100%    Isolation Precautions Airborne and Contact precautions  Medications Medications  acetaminophen (TYLENOL) tablet 650 mg (has no administration in time range)  aspirin EC tablet 81 mg (81 mg Oral Given 02/25/23 1215)  ezetimibe (ZETIA) tablet 10 mg (10 mg Oral Given 02/25/23 1206)  midodrine (PROAMATINE) tablet 2.5 mg (2.5 mg Oral Given 02/25/23 1654)  levothyroxine (SYNTHROID) tablet 100 mcg (100 mcg Oral Given 02/25/23  0647)  predniSONE (DELTASONE) tablet 5 mg (5 mg Oral Given 02/25/23 0822)  famotidine (PEPCID) tablet 20 mg (20 mg Oral Given 02/24/23 2339)  clopidogrel (PLAVIX)  tablet 75 mg (75 mg Oral Given 02/25/23 0820)  ferrous sulfate 300 (60 Fe) MG/5ML syrup 330 mg (has no administration in time range)  folic acid (FOLVITE) tablet 1 mg (1 mg Oral Given 02/24/23 2339)  levETIRAcetam (KEPPRA) tablet 500 mg (500 mg Oral Given 02/25/23 1051)  loratadine (CLARITIN) tablet 10 mg (10 mg Oral Given 02/25/23 1247)  enoxaparin (LOVENOX) injection 40 mg (40 mg Subcutaneous Given 02/25/23 1031)  nirmatrelvir/ritonavir (renal dosing) (PAXLOVID) 2 tablet (2 tablets Oral Given 02/25/23 1058)  feeding supplement (ENSURE ENLIVE / ENSURE PLUS) liquid 237 mL (237 mLs Oral Patient Refused/Not Given 02/25/23 1555)  nystatin (MYCOSTATIN) 100000 UNIT/ML suspension 500,000 Units (500,000 Units Oral Given 02/25/23 1555)  0.9 %  sodium chloride infusion (0 mLs Intravenous Stopped 02/25/23 1555)  fluconazole (DIFLUCAN) tablet 100 mg (has no administration in time range)  insulin detemir (LEVEMIR) injection 5 Units (has no administration in time range)  insulin aspart (novoLOG) injection 0-9 Units (has no administration in time range)  insulin aspart (novoLOG) injection 0-5 Units (has no administration in time range)  insulin aspart (novoLOG) injection 3 Units (has no administration in time range)  lactated ringers bolus 1,000 mL (0 mLs Intravenous Stopped 02/25/23 0204)  dextrose 50 % solution 50 mL (50 mLs Intravenous Given 02/24/23 2333)  fluconazole (DIFLUCAN) IVPB 200 mg (0 mg Intravenous Stopped 02/25/23 1218)    Mobility non-ambulatory

## 2023-02-25 NOTE — Assessment & Plan Note (Signed)
-  s/p right common iliac artery stent, left common iliac stent on 7/22 -continue aspirin and plavix

## 2023-02-25 NOTE — Assessment & Plan Note (Signed)
Pt alert and oriented to self, place and time on admission

## 2023-02-25 NOTE — Progress Notes (Signed)
OT Cancellation Note  Patient Details Name: [name] [name] MRN: [id number] DOB: [birth year]   Cancelled Treatment:    Reason Eval/Treat Not Completed: OT screen initiated and pt determined to be without acute therapy needs. OT spoke with the pt's daughter who was present in the room with the pt. [name] stated the pt has been requiring total assist for ADLs, except for feeding, as the pt has demonstrated gradual functional decline. She also stated the pt has received therapy services before, however the pt did not appear to benefit from such services in the recent past, given her functional limitations. Overall, the pt appears to be at her baseline level of functioning for self-care management, with decreased ability to demonstrate significant functional gains from receiving therapy services in the acute setting. Pt's family desires to take the pt home once she's discharged from the hospital. OT will sign off. Thank you.    Reuben Likes, OTR/L 02/25/2023, 4:04 PM

## 2023-02-26 ENCOUNTER — Encounter (HOSPITAL_COMMUNITY): Payer: Self-pay | Admitting: Family Medicine

## 2023-02-26 DIAGNOSIS — E11649 Type 2 diabetes mellitus with hypoglycemia without coma: Secondary | ICD-10-CM | POA: Diagnosis not present

## 2023-02-26 DIAGNOSIS — Z794 Long term (current) use of insulin: Secondary | ICD-10-CM | POA: Diagnosis not present

## 2023-02-26 DIAGNOSIS — E039 Hypothyroidism, unspecified: Secondary | ICD-10-CM | POA: Diagnosis not present

## 2023-02-26 DIAGNOSIS — I152 Hypertension secondary to endocrine disorders: Secondary | ICD-10-CM | POA: Diagnosis not present

## 2023-02-26 DIAGNOSIS — E1159 Type 2 diabetes mellitus with other circulatory complications: Secondary | ICD-10-CM | POA: Diagnosis not present

## 2023-02-26 DIAGNOSIS — U071 COVID-19: Secondary | ICD-10-CM | POA: Diagnosis not present

## 2023-02-26 LAB — GLUCOSE, CAPILLARY
Glucose-Capillary: 52 mg/dL — ABNORMAL LOW (ref 70–99)
Glucose-Capillary: 96 mg/dL (ref 70–99)
Glucose-Capillary: 292 mg/dL — ABNORMAL HIGH (ref 70–99)
Glucose-Capillary: 232 mg/dL — ABNORMAL HIGH (ref 70–99)
Glucose-Capillary: 126 mg/dL — ABNORMAL HIGH (ref 70–99)

## 2023-02-26 MED ORDER — LEVETIRACETAM 100 MG/ML PO SOLN
500.0000 mg | Freq: Two times a day (BID) | ORAL | Status: DC
  Administered 2023-02-26 – 2023-03-01 (×7): 500 mg via ORAL
  Filled 2023-02-26 (×7): qty 5

## 2023-02-26 MED ORDER — INSULIN DETEMIR 100 UNIT/ML ~~LOC~~ SOLN
5.0000 [IU] | Freq: Every day | SUBCUTANEOUS | Status: DC
  Administered 2023-02-26 – 2023-02-28 (×3): 5 [IU] via SUBCUTANEOUS
  Filled 2023-02-26 (×4): qty 0.05

## 2023-02-26 NOTE — Evaluation (Signed)
Physical Therapy Evaluation Patient Details Name: [name] [name] MRN: [id number] DOB: 04-Nov-1933 Today's Date: 02/26/2023  History of Present Illness  Patient is 89 y.o. female who presented to ED from home on 02/24/2023 due to worsening cough and progressive weakness. Pt recently hospitalized due to aspiration PNA. Pt tested positive for COVID. Pt PMH includes but is not limited to: CVA, HTN, HLD, type II DM, PAD, hypothyroidism, rdermatomyositis, recurrent thrush, and seizures.  Clinical Impression     Pt admitted with above diagnosis.  Pt currently with functional limitations due to the deficits listed below (see PT Problem List). Family had declined OT services and PT inquired in am if pt/family would like to have PT intervention and agreeable. PT returned later in the day, family present and pt agreeable to PT evaluation. Family expressed concern per increased physical assist, poor core stability and sitting balance with functional mobility tasks and indicated pt is no longer ambulatory. Pt required max A for supine <> sit. Pt is able to bridge to assist with repositioning in bed. Pt required CGA to mod A for balance with difficulty implementing self recovery strategies, cues for attention to midline and trunk flexion as well as maintaining B LE on floor throughout eval. Pt able to participate with reaching tasks outside BOS in unsupported sitting EOB with min A and ongoing cues. Pt required max A for sit to stand  from EOB x 3 for repositioning toward [facility], pt is able to engage with power up and B LE extension. Pt left in bed all needs in place use of pillows to offload L side and float heels. Family ed provided on AA LE TE and recommendation for foot cradle for hospital bed in home setting to limit pressure on feet secondary to toe ulcers/wounds. Family able to continue to provide 24/7 supervision and assist in home setting and anticipate d/c home with [facility] services.  Pt will benefit from acute skilled PT  to increase their independence and safety with mobility to allow discharge.         If plan is discharge home, recommend the following: Two people to help with walking and/or transfers;A lot of help with bathing/dressing/bathroom;Assistance with cooking/housework;Direct supervision/assist for medications management;Direct supervision/assist for financial management;Assist for transportation;Help with stairs or ramp for entrance;Supervision due to cognitive status   Can travel by private vehicle        Equipment Recommendations None recommended by PT  Recommendations for Other Services       Functional Status Assessment Patient has had a recent decline in their functional status and demonstrates the ability to make significant improvements in function in a reasonable and predictable amount of time.     Precautions / Restrictions Precautions Precautions: Fall Restrictions Weight Bearing Restrictions: No      Mobility  Bed Mobility Overal bed mobility: Needs Assistance Bed Mobility: Supine to Sit, Sit to Supine     Supine to sit: HOB elevated, Max assist Sit to supine: Max assist   General bed mobility comments: increased time and multimodal cues. daughter reports sliding legs to EOB and assisting with trunk wtih pt pulling up or use of bed pad with hosptial bed at baseline    Transfers Overall transfer level: Needs assistance Equipment used: None Transfers: Sit to/from Stand Sit to Stand: Max assist, From elevated surface           General transfer comment: pt required max A for sit to stand from EOB, pt able to assist with extension and  power up and once standing min A to maintain    Ambulation/Gait               General Gait Details: NT limited to no ambulation at baseline and per daugthers request no ambulation at eval  Stairs            Wheelchair Mobility     Tilt Bed    Modified Rankin (Stroke Patients Only)       Balance Overall  balance assessment: Needs assistance Sitting-balance support: Bilateral upper extremity supported, No upper extremity supported Sitting balance-Leahy Scale: Poor Sitting balance - Comments: intermittent abiltiy to maintain static unsupported sitting balance EOB with multimodal cues with CGA to mod A, cues for attention to midline and trunk flexion as well as maintaining LEs on floor Postural control: Posterior lean, Right lateral lean Standing balance support: Bilateral upper extremity supported (with therapist) Standing balance-Leahy Scale: Poor                               Pertinent Vitals/Pain Pain Assessment Pain Assessment: No/denies pain    Home Living Family/patient expects to be discharged to:: Private residence Living Arrangements: Children Available Help at Discharge: Family;Available 24 hours/day Type of Home: House Home Access: Stairs to enter Entrance Stairs-Rails: None Entrance Stairs-Number of Steps: 2   Home Layout: One level Home Equipment: Agricultural consultant (2 wheels);Shower seat - built in;Wheelchair - manual;BSC/3in1;Hospital bed Additional Comments: family provides 27/7 supervision and assist    Prior Function Prior Level of Function : Needs assist       Physical Assist : ADLs (physical);Mobility (physical) Mobility (physical): Gait;Transfers;Bed mobility ADLs (physical): Feeding;Grooming;Bathing;Dressing;IADLs;Toileting Mobility Comments: Family assists with mobility for safety; uses w/c, SPTs ADLs Comments: total A with ADLs/selfcare, feeds herself per her daughter     Extremity/Trunk Assessment        Lower Extremity Assessment Lower Extremity Assessment: Generalized weakness    Cervical / Trunk Assessment Cervical / Trunk Assessment:  (head forward)  Communication   Communication Communication: No apparent difficulties  Cognition Arousal: Alert Behavior During Therapy: WFL for tasks assessed/performed Overall Cognitive Status:  History of cognitive impairments - at baseline                                 General Comments: at baseline per pt's daughter        General Comments      Exercises General Exercises - Lower Extremity Heel Slides: AROM, Both, 5 reps   Assessment/Plan    PT Assessment Patient needs continued PT services  PT Problem List Decreased strength;Decreased activity tolerance;Decreased balance;Decreased mobility;Decreased coordination       PT Treatment Interventions DME instruction;Functional mobility training;Therapeutic activities;Therapeutic exercise;Balance training;Neuromuscular re-education;Patient/family education    PT Goals (Current goals can be found in the Care Plan section)  Acute Rehab PT Goals Patient Stated Goal: go home PT Goal Formulation: With patient/family Time For Goal Achievement: 03/12/23 Potential to Achieve Goals: Good    Frequency Min 1X/week     Co-evaluation               AM-PAC PT "6 Clicks" Mobility  Outcome Measure Help needed turning from your back to your side while in a flat bed without using bedrails?: A Little Help needed moving from lying on your back to sitting on the side of a flat bed without using bedrails?:  A Lot Help needed moving to and from a bed to a chair (including a wheelchair)?: A Lot Help needed standing up from a chair using your arms (e.g., wheelchair or bedside chair)?: A Lot Help needed to walk in hospital room?: Total Help needed climbing 3-5 steps with a railing? : Total 6 Click Score: 11    End of Session   Activity Tolerance: Patient limited by fatigue Patient left: in bed;with call bell/phone within reach;with family/visitor present Nurse Communication: Mobility status PT Visit Diagnosis: Unsteadiness on feet (R26.81);Muscle weakness (generalized) (M62.81);Difficulty in walking, not elsewhere classified (R26.2)    Time: 0347-4259 PT Time Calculation (min) (ACUTE ONLY): 24 min   Charges:    PT Evaluation $PT Eval Low Complexity: 1 Low PT Treatments $Therapeutic Activity: 8-22 mins PT General Charges $$ ACUTE PT VISIT: 1 Visit         Johnny Bridge, PT Acute Rehab   Jacqualyn Posey 02/26/2023, 6:35 PM

## 2023-02-26 NOTE — TOC Initial Note (Signed)
Transition of Care [facility]) - Initial/Assessment Note    Patient Details  Name: [name] [name] MRN: [id number] Date of Birth: 1933-11-15  Transition [facility]) CM/SW Contact:    Otelia Santee, LCSW Phone Number: 02/26/2023, 11:10 AM  Clinical Narrative:                 Spoke with pt's daughter via t/c. Pt currently resides at home with her adult children. Pt has a cane, RW, wheelchair, and BSC at home however, has been primarily bed bound. Pt is receiving HHPT/OT/RN with [facility] and roc orders will need to be placed prior to discharge. Family share that they will transport pt home at discharge.  TOC will continue to follow for needs.  Expected Discharge Plan: Home w Home Health Services Barriers to Discharge: No Barriers Identified   Patient Goals and CMS Choice Patient states their goals for this hospitalization and ongoing recovery are:: For pt to return home CMS Medicare.gov Compare Post Acute Care list provided to:: Legal Guardian Choice offered to / list presented to : [facility] POA / Guardian, Adult Children [place] ownership interest in [facility].provided to:: [facility] POA / Guardian    Expected Discharge Plan and Services In-house Referral: NA Discharge Planning Services: NA Post Acute Care Choice: Home Health, Resumption of Svcs/PTA Provider Living arrangements for the past 2 months: Single Family Home                 DME Arranged: N/A DME Agency: NA                  Prior Living Arrangements/Services Living arrangements for the past 2 months: Single Family Home Lives with:: Adult Children Patient language and need for interpreter reviewed:: Yes Do you feel safe going back to the place where you live?: Yes      Need for Family Participation in Patient Care: Yes (Comment) Care giver support system in place?: Yes (comment) Current home services: DME (Cane, RW, BSC, wheelchair, HH w/ [facility]) Criminal Activity/Legal Involvement Pertinent to Current  Situation/Hospitalization: No - Comment as needed  Activities of Daily Living Home Assistive Devices/Equipment: Dentures (specify type) ADL Screening (condition at time of admission) Patient's cognitive ability adequate to safely complete daily activities?: No Is the patient deaf or have difficulty hearing?: No Does the patient have difficulty seeing, even when wearing glasses/contacts?: No Does the patient have difficulty concentrating, remembering, or making decisions?: Yes Patient able to express need for assistance with ADLs?: Yes Does the patient have difficulty dressing or bathing?: Yes Independently performs ADLs?: No Communication: Independent Dressing (OT): Dependent Is this a change from baseline?: Pre-admission baseline Grooming: Dependent Is this a change from baseline?: Pre-admission baseline Feeding: Needs assistance Is this a change from baseline?: Pre-admission baseline Bathing: Dependent Is this a change from baseline?: Pre-admission baseline Toileting: Dependent Is this a change from baseline?: Pre-admission baseline In/Out Bed: Dependent Is this a change from baseline?: Pre-admission baseline Walks in Home: Dependent Is this a change from baseline?: Pre-admission baseline Does the patient have difficulty walking or climbing stairs?: Yes Weakness of Legs: Both Weakness of Arms/Hands: Both  Permission Sought/Granted Permission sought to share information with : Family Supports, Oceanographer granted to share information with : Yes, Verbal Permission Granted     Permission granted to share info w AGENCY: [facility]        Emotional Assessment   Attitude/Demeanor/Rapport: Unable to Assess Affect (typically observed): Unable to Assess Orientation: : Oriented  to Self Alcohol / Substance Use: Not Applicable Psych Involvement: No (comment)  Admission diagnosis:  COVID [U07.1] COVID-19 [U07.1] Patient Active Problem List    Diagnosis Date Noted   COVID-19 02/24/2023   Weakness 02/24/2023   AMS (altered mental status) 02/15/2023   Paraparesis (HCC) 01/04/2023   Other specified abnormal findings of blood chemistry 01/04/2023   Cervical radiculitis 01/04/2023   Cervical spinal stenosis 01/04/2023   UTI (urinary tract infection) 10/03/2022   Sepsis due to urinary tract infection (HCC) 10/02/2022   Normocytic anemia 07/27/2022   Dementia without behavioral disturbance (HCC) 07/27/2022   Malnutrition of moderate degree 05/11/2022   PNA (pneumonia) 01/19/2022   Syncope and collapse 09/13/2021   Dermatopolymyositis, unspecified, organ involvement unspecified (HCC) 09/13/2021   Poor appetite 09/13/2021   Hyponatremia 09/13/2021   Uncontrolled diabetes mellitus with hypoglycemia without coma, with long-term current use of insulin (HCC) 09/13/2021   Sacral ulcer (HCC) 09/13/2021   Oropharyngeal dysphagia 05/15/2021   Allergic conjunctivitis of both eyes 03/15/2020   PAD (peripheral artery disease) (HCC) 10/24/2019   Malnutrition (HCC) 10/10/2019   History of seizure 10/08/2019   Right shoulder pain 08/21/2019   Leukocytosis 08/21/2019   Acute encephalopathy 08/18/2019   Enterocolitis 08/18/2019   Hyperglycemia 08/18/2019   Chronic kidney disease    Pressure injury of skin 12/09/2016   SBO (small bowel obstruction) s/p lysis of adhesions 12/03/2016 12/04/2016   Acute urinary retention 11/27/2016   Ascites 11/27/2016   Abdominal distension    Facial droop    Hyperkalemia 11/25/2016   Sepsis (HCC) 11/25/2016   Acute renal failure superimposed on stage 2 chronic kidney disease (HCC) 11/25/2016   Delirium 11/25/2016   Age-related osteoporosis without current pathological fracture 10/01/2016   GERD (gastroesophageal reflux disease) 06/10/2016   Insomnia 06/10/2016   Viral gastroenteritis    Right hip pain 05/06/2015   Vaginal atrophy 11/01/2013   ECZEMA 07/24/2008   INTERMITTENT VERTIGO 07/24/2008    Constipation 11/16/2007   INSOMNIA 08/12/2007   Oral thrush 08/03/2007   Hypertension with hx of orthostatic hypotension 02/14/2007   Allergic rhinitis due to allergen 02/14/2007   Hypothyroidism, acquired 01/26/2007   Uncontrolled type 2 diabetes mellitus with hypoglycemia, with long-term current use of insulin (HCC) 01/26/2007   GERD 01/26/2007   DEGENERATIVE JOINT DISEASE 01/26/2007   PCP:  [name], MD Pharmacy:   [id number] - [place], [place] - [address] AT [facility] [address] [place] [address] Phone: [phone] Fax: [phone]     Social Determinants of Health (SDOH) Social History: SDOH Screenings   Food Insecurity: No Food Insecurity (02/26/2023)  Housing: Low Risk  (02/26/2023)  Transportation Needs: No Transportation Needs (02/26/2023)  Utilities: Not At Risk (02/26/2023)  Social Connections: Unknown (12/02/2021)   Received from [facility]  Tobacco Use: Medium Risk (02/26/2023)   SDOH Interventions:     Readmission Risk Interventions    02/16/2023   11:04 AM 10/04/2022   11:17 AM 01/19/2022   11:11 AM  Readmission Risk Prevention Plan  Transportation Screening Complete Complete Complete  PCP or Specialist Appt within 3-5 Days  Complete Complete  HRI or Home Care Consult  Complete Complete  Social Work Consult for Recovery Care Planning/Counseling  Complete Complete  Palliative Care Screening  Not Applicable Not Applicable  Medication Review Oceanographer)  Complete Complete  HRI or Home Care Consult Complete    SW Recovery Care/Counseling Consult Complete

## 2023-02-26 NOTE — Progress Notes (Signed)
[facility] PROGRESS NOTE    Progress Note  [name] [name]  ZDG:[id number] DOB: 05/29/1934 DOA: 02/24/2023 PCP: [name], MD     Brief Narrative:   [name] [name] is an 89 y.o. female past medical history significant for dementia, on steroids and Plaquenil for dermatomyositis chronic diastolic heart failure, insulin-dependent diabetes mellitus type 2, history of CVA chronic kidney disease stage II, recently discharged from the hospital for aspiration pneumonia last week he completed a course of antibiotics comes in with weakness, most of the history was obtained from the daughter relates she has had a productive cough, she requires assistance for transfers and her ADLs mostly bedbound, was COVID-positive in the ED   Assessment/Plan:   COVID-19 She is not hypoxic steroids were not started. She was started on Paxil. She has remained afebrile, with no leukocytosis. Continue conservative management. Can show PT OT.  Recurrent Oral thrush: Oral thrush is improved continue Diflucan and oral nystatin.  Uncontrolled type 2 diabetes mellitus with hypoglycemia, with long-term current use  of insulin (HCC) Her last A1c was 8.4. Low blood glucose this morning decrease long-acting insulin continue sliding scale.  Hypertension with hx of orthostatic hypotension Continue midodrine.  Hypothyroidism, acquired Continue Synthroid.  Dermatopolymyositis, unspecified, organ involvement unspecified (HCC) Hold immunosuppressive therapy Plaquenil in the setting of COVID. Continue chronic prednisone.  Generalized weakness: Multifactorial due to recent pneumonia and new COVID-19.  Moderate protein caloric malnutrition: Continue Ensure with meals.  History of seizure Continue Keppra.  Peripheral arterial disease: Continue aspirin and Plavix.  Stage I sacral decubitus ulcer present on admission: Turn patient every 2 hours and try to mobilize out of bed to chair. Consult physical  therapy. RN Pressure Injury Documentation: Pressure Injury 10/02/22 Coccyx Medial Stage 2 -  Partial thickness loss of dermis presenting as a shallow open injury with a red, pink wound bed without slough. (Active)  10/02/22 2055  Location: Coccyx  Location Orientation: Medial  Staging: Stage 2 -  Partial thickness loss of dermis presenting as a shallow open injury with a red, pink wound bed without slough.  Wound Description (Comments):   Present on Admission: Yes     Pressure Injury 10/02/22 Heel Left;Right Stage 1 -  Intact skin with non-blanchable redness of a localized area usually over a bony prominence. Bilateral heel redness, prophylactic drsg applied, heels floating (Active)  10/02/22 2055  Location: Heel  Location Orientation: Left;Right  Staging: Stage 1 -  Intact skin with non-blanchable redness of a localized area usually over a bony prominence.  Wound Description (Comments): Bilateral heel redness, prophylactic drsg applied, heels floating  Present on Admission: Yes     Pressure Injury 10/02/22 Knee Anterior;Left Stage 2 -  Partial thickness loss of dermis presenting as a shallow open injury with a red, pink wound bed without slough. 2 open areas above left knee (Active)  10/02/22 2055  Location: Knee  Location Orientation: Anterior;Left  Staging: Stage 2 -  Partial thickness loss of dermis presenting as a shallow open injury with a red, pink wound bed without slough.  Wound Description (Comments): 2 open areas above left knee  Present on Admission: Yes     Pressure Injury 10/02/22 Toe (Comment  which one) Anterior;Right Stage 1 -  Intact skin with non-blanchable redness of a localized area usually over a bony prominence. right bunion redness (Active)  10/02/22 2055  Location: Toe (Comment  which one)  Location Orientation: Anterior;Right  Staging: Stage 1 -  Intact skin with  non-blanchable redness of a localized area usually over a bony prominence.  Wound Description  (Comments): right bunion redness  Present on Admission: Yes     Pressure Injury 10/02/22 Back Left;Upper Stage 1 -  Intact skin with non-blanchable redness of a localized area usually over a bony prominence. erythema to bony prominence of left shoulder blade (Active)  10/02/22 2055  Location: Back  Location Orientation: Left;Upper  Staging: Stage 1 -  Intact skin with non-blanchable redness of a localized area usually over a bony prominence.  Wound Description (Comments): erythema to bony prominence of left shoulder blade  Present on Admission: Yes    DVT prophylaxis: lovenox Family Communication:daughter Status is: Observation The patient remains OBS appropriate and will d/c before 2 midnights.    Code Status:     Code Status Orders  (From admission, onward)           Start     Ordered   02/25/23 0006  Do not attempt resuscitation (DNR)  Continuous       Question Answer Comment  If patient has no pulse and is not breathing Do Not Attempt Resuscitation   If patient has a pulse and/or is breathing: Medical Treatment Goals LIMITED ADDITIONAL INTERVENTIONS: Use medication/IV fluids and cardiac monitoring as indicated; Do not use intubation or mechanical ventilation (DNI), also provide comfort medications.  Transfer to Progressive/Stepdown as indicated, avoid Intensive Care.   Consent: Discussion documented in EHR or advanced directives reviewed      02/25/23 0006           Code Status History     Date Active Date Inactive Code Status Order ID Comments User Context   02/15/2023 0120 02/17/2023 2007 DNR [id number]  Darlin Drop, DO ED   02/08/2023 1730 02/09/2023 1537 Full Code [id number]  Chuck Hint, MD Inpatient   10/02/2022 1929 10/05/2022 1859 DNR [id number]  Orland Mustard, MD ED   09/10/2022 1409 09/11/2022 2127 DNR [id number]  Emeline General, MD ED   07/27/2022 2317 07/30/2022 2004 DNR [id number]  Charlsie Quest, MD ED   05/09/2022 2153 05/13/2022 2041 Full Code  [id number]  Darlin Drop, DO ED   01/13/2022 1556 01/20/2022 1935 DNR [id number]  Teddy Spike, DO Inpatient   09/13/2021 1921 09/16/2021 1758 DNR [id number]  Rolly Salter, MD ED   09/13/2021 1901 09/13/2021 1921 Full Code [id number]  Rolly Salter, MD ED   10/08/2019 1040 10/12/2019 1639 Full Code [id number]  Arline Asp, NP ED   08/21/2019 1118 08/23/2019 2332 Full Code [id number]  Clydie Braun, MD ED   08/18/2019 0602 08/20/2019 1748 Full Code [id number]  John Giovanni, MD Inpatient   11/26/2016 0048 12/09/2016 1943 Full Code [id number]  Alberteen Sam, MD Inpatient   06/10/2016 1355 06/11/2016 1537 Full Code [id number]  Marcos Eke, PA-C ED         IV Access:   Peripheral IV   Procedures and diagnostic studies:   CT HEAD WO CONTRAST ( )  Result Date: 02/24/2023 CLINICAL DATA:  Head trauma, minor (Age >= 65y) Mental status change, unknown cause EXAM: CT HEAD WITHOUT CONTRAST TECHNIQUE: Contiguous axial images were obtained from the base of the skull through the vertex without intravenous contrast. RADIATION DOSE REDUCTION: This exam was performed according to the departmental dose-optimization program which includes automated exposure control, adjustment of the mA and/or kV according to patient size and/or use of iterative reconstruction technique. COMPARISON:  02/14/2023 FINDINGS: Brain: Mild parenchymal volume  loss is commensurate with the patient's age. Moderate bilateral periventricular and deep white matter changes are present likely reflecting the sequela of small vessel ischemia. These appear stable since prior examination. Remote bilateral thalamic lacunar infarcts again noted. No evidence of acute intracranial hemorrhage or infarct. No abnormal mass effect or midline shift. No abnormal intra or extra-axial mass lesion or fluid collection. Ventricular size is normal. Cerebellum is unremarkable. Vascular: No hyperdense vessel or unexpected calcification. Skull: Normal.  Negative for fracture or focal lesion. Sinuses/Orbits: No acute finding. Other: Mastoid air cells and middle ear cavities are clear. IMPRESSION: 1. No acute intracranial hemorrhage or infarct. 2. Stable senescent change. 3. Remote bilateral thalamic lacunar infarcts. Electronically Signed   By: Helyn Numbers M.D.   On: 02/24/2023 20:16   DG Chest Port 1 View  Result Date: 02/24/2023 CLINICAL DATA:  Altered mental status EXAM: PORTABLE CHEST 1 VIEW COMPARISON:  02/14/2023 x-ray FINDINGS: Hyperinflation. No consolidation, pneumothorax or effusion. No edema. Normal cardiopericardial silhouette with a calcified aorta. The previous opacity left lung base is no longer seen. Loop recorder overlying the lower left thorax. IMPRESSION: Improved left lung base opacity. Hyperinflation.  No effusion or edema. Loop recorder Electronically Signed   By: Karen Kays M.D.   On: 02/24/2023 17:11     Medical Consultants:   None.   Subjective:    [name] [name] she relates her urine is coming back along with her appetite.  Objective:    Vitals:   02/25/23 1944 02/25/23 2020 02/26/23 0108 02/26/23 0447  BP:  (!) 186/76 (!) 163/60 (!) 130/51  Pulse:  71 (!) 54 (!) 58  Resp:  16 16 16   Temp: 99.7 F (37.6 C) 99.2 F (37.3 C) 98.3 F (36.8 C) 98.8 F (37.1 C)  TempSrc: Oral Oral Oral   SpO2:  99% 100% 100%   SpO2: 100 %   Intake/Output Summary (Last 24 hours) at 02/26/2023 0825 Last data filed at 02/26/2023 0537 Gross per 24 hour  Intake 962.01 ml  Output 500 ml  Net 462.01 ml   There were no vitals filed for this visit.  Exam: General exam: In no acute distress. Respiratory system: Good air movement and clear to auscultation. Cardiovascular system: S1 & S2 heard, RRR. No JVD. Gastrointestinal system: Abdomen is nondistended, soft and nontender.  Extremities: No pedal edema. Skin: No rashes, lesions or ulcers Psychiatry: Judgement and insight appear normal. Mood & affect appropriate.  Data  Reviewed:    Labs: Basic Metabolic Panel: Recent Labs  Lab 02/24/23 1454 02/25/23 0702  NA 133* 137  K 4.3 3.1*  CL 96* 104  CO2 27 25  GLUCOSE 180* 133*  BUN 10 7*  CREATININE 0.81 0.49  CALCIUM 9.2 8.0*   GFR Estimated Creatinine Clearance: 43.6 mL/min (by C-G formula based on SCr of 0.49 mg/dL). Liver Function Tests: Recent Labs  Lab 02/24/23 1630  AST 35  ALT 22  ALKPHOS 68  BILITOT 0.6  PROT 6.6  ALBUMIN 3.0*   No results for input(s): "LIPASE", "AMYLASE" in the last 168 hours. No results for input(s): "AMMONIA" in the last 168 hours. Coagulation profile No results for input(s): "INR", "PROTIME" in the last 168 hours. COVID-19 Labs  No results for input(s): "DDIMER", "FERRITIN", "LDH", "CRP" in the last 72 hours.  Lab Results  Component Value Date   SARSCOV2NAA POSITIVE (A) 02/24/2023   SARSCOV2NAA NEGATIVE 02/14/2023   SARSCOV2NAA NEGATIVE 01/04/2023   SARSCOV2NAA NEGATIVE 10/02/2022    CBC: Recent Labs  Lab 02/24/23 1454 02/25/23 0702  WBC 8.1 7.0  HGB 8.6* 7.1*  HCT 26.6* 22.4*  MCV 99.6 101.4*  PLT 456* 272   Cardiac Enzymes: No results for input(s): "CKTOTAL", "CKMB", "CKMBINDEX", "TROPONINI" in the last 168 hours. BNP (last 3 results) No results for input(s): "PROBNP" in the last 8760 hours. CBG: Recent Labs  Lab 02/25/23 1234 02/25/23 1602 02/25/23 1941 02/25/23 2031 02/26/23 0741  GLUCAP 313* 303* 238* 230* 52*   D-Dimer: No results for input(s): "DDIMER" in the last 72 hours. Hgb A1c: Recent Labs    02/26/23 0055  HGBA1C 8.8*   Lipid Profile: No results for input(s): "CHOL", "HDL", "LDLCALC", "TRIG", "CHOLHDL", "LDLDIRECT" in the last 72 hours. Thyroid function studies: No results for input(s): "TSH", "T4TOTAL", "T3FREE", "THYROIDAB" in the last 72 hours.  Invalid input(s): "FREET3" Anemia work up: No results for input(s): "VITAMINB12", "FOLATE", "FERRITIN", "TIBC", "IRON", "RETICCTPCT" in the last 72 hours. Sepsis  Labs: Recent Labs  Lab 02/24/23 1454 02/25/23 0702  WBC 8.1 7.0   Microbiology Recent Results (from the past 240 hour(s))  SARS Coronavirus 2 by RT PCR (hospital order, performed in [facility] hospital lab) *cepheid single result test* Anterior Nasal Swab     Status: Abnormal   Collection Time: 02/24/23  4:26 PM   Specimen: Anterior Nasal Swab  Result Value Ref Range Status   SARS Coronavirus 2 by RT PCR POSITIVE (A) NEGATIVE Final    Comment: (NOTE) SARS-CoV-2 target nucleic acids are DETECTED  SARS-CoV-2 RNA is generally detectable in upper respiratory specimens  during the acute phase of infection.  Positive results are indicative  of the presence of the identified virus, but do not rule out bacterial infection or co-infection with other pathogens not detected by the test.  Clinical correlation with patient history and  other diagnostic information is necessary to determine patient infection status.  The expected result is negative.  Fact Sheet for Patients:   RoadLapTop.co.za   Fact Sheet for Healthcare Providers:   http://kim-miller.com/    This test is not yet approved or cleared by the [place] FDA and  has been authorized for detection and/or diagnosis of SARS-CoV-2 by FDA under an Emergency Use Authorization (EUA).  This EUA will remain in effect (meaning this test can be used) for the duration of  the COVID-19 declaration under Section 564(b)(1)  of the Act, 21 U.S.C. section 360-bbb-3(b)(1), unless the authorization is terminated or revoked sooner.   Performed at [facility], [address] [place], [place] [address]      Medications:    aspirin EC  81 mg Oral Daily   clopidogrel  75 mg Oral Q breakfast   enoxaparin (LOVENOX) injection  40 mg Subcutaneous Q24H   ezetimibe  10 mg Oral Daily   famotidine  20 mg Oral QHS   feeding supplement  237 mL Oral BID BM   ferrous sulfate  330  mg Oral Once per day on Monday Wednesday Friday   fluconazole  100 mg Oral Daily   folic acid  1 mg Oral QHS   insulin aspart  0-5 Units Subcutaneous QHS   insulin aspart  0-9 Units Subcutaneous TID WC   insulin aspart  3 Units Subcutaneous TID WC   insulin detemir  5 Units Subcutaneous BID   levETIRAcetam  500 mg Oral BID   levothyroxine  100 mcg Oral Q0600   loratadine  10 mg Oral Daily   midodrine  2.5 mg Oral TID WC  nirmatrelvir/ritonavir (renal dosing)  2 tablet Oral BID   nystatin  5 mL Oral QID   predniSONE  5 mg Oral Q breakfast   Continuous Infusions:      LOS: 0 days   Marinda Elk  [facility]  02/26/2023, 8:25 AM

## 2023-02-27 DIAGNOSIS — F0390 Unspecified dementia without behavioral disturbance: Secondary | ICD-10-CM | POA: Diagnosis not present

## 2023-02-27 DIAGNOSIS — E440 Moderate protein-calorie malnutrition: Secondary | ICD-10-CM | POA: Diagnosis not present

## 2023-02-27 DIAGNOSIS — E1151 Type 2 diabetes mellitus with diabetic peripheral angiopathy without gangrene: Secondary | ICD-10-CM | POA: Diagnosis not present

## 2023-02-27 DIAGNOSIS — Z8249 Family history of ischemic heart disease and other diseases of the circulatory system: Secondary | ICD-10-CM | POA: Diagnosis not present

## 2023-02-27 DIAGNOSIS — E1159 Type 2 diabetes mellitus with other circulatory complications: Secondary | ICD-10-CM | POA: Diagnosis not present

## 2023-02-27 DIAGNOSIS — E039 Hypothyroidism, unspecified: Secondary | ICD-10-CM | POA: Diagnosis not present

## 2023-02-27 DIAGNOSIS — E871 Hypo-osmolality and hyponatremia: Secondary | ICD-10-CM | POA: Diagnosis not present

## 2023-02-27 DIAGNOSIS — Z8673 Personal history of transient ischemic attack (TIA), and cerebral infarction without residual deficits: Secondary | ICD-10-CM | POA: Diagnosis not present

## 2023-02-27 DIAGNOSIS — U071 COVID-19: Secondary | ICD-10-CM | POA: Diagnosis not present

## 2023-02-27 DIAGNOSIS — Z79890 Hormone replacement therapy (postmenopausal): Secondary | ICD-10-CM | POA: Diagnosis not present

## 2023-02-27 DIAGNOSIS — E1122 Type 2 diabetes mellitus with diabetic chronic kidney disease: Secondary | ICD-10-CM | POA: Diagnosis not present

## 2023-02-27 DIAGNOSIS — I152 Hypertension secondary to endocrine disorders: Secondary | ICD-10-CM | POA: Diagnosis not present

## 2023-02-27 DIAGNOSIS — Z66 Do not resuscitate: Secondary | ICD-10-CM | POA: Diagnosis not present

## 2023-02-27 DIAGNOSIS — M3390 Dermatopolymyositis, unspecified, organ involvement unspecified: Secondary | ICD-10-CM | POA: Diagnosis not present

## 2023-02-27 DIAGNOSIS — I130 Hypertensive heart and chronic kidney disease with heart failure and stage 1 through stage 4 chronic kidney disease, or unspecified chronic kidney disease: Secondary | ICD-10-CM | POA: Diagnosis not present

## 2023-02-27 DIAGNOSIS — I959 Hypotension, unspecified: Secondary | ICD-10-CM | POA: Diagnosis not present

## 2023-02-27 DIAGNOSIS — E1165 Type 2 diabetes mellitus with hyperglycemia: Secondary | ICD-10-CM | POA: Diagnosis not present

## 2023-02-27 DIAGNOSIS — B370 Candidal stomatitis: Secondary | ICD-10-CM | POA: Diagnosis not present

## 2023-02-27 DIAGNOSIS — Z794 Long term (current) use of insulin: Secondary | ICD-10-CM | POA: Diagnosis not present

## 2023-02-27 DIAGNOSIS — E876 Hypokalemia: Secondary | ICD-10-CM | POA: Diagnosis not present

## 2023-02-27 DIAGNOSIS — E11649 Type 2 diabetes mellitus with hypoglycemia without coma: Secondary | ICD-10-CM | POA: Diagnosis not present

## 2023-02-27 DIAGNOSIS — E861 Hypovolemia: Secondary | ICD-10-CM | POA: Diagnosis not present

## 2023-02-27 DIAGNOSIS — L89151 Pressure ulcer of sacral region, stage 1: Secondary | ICD-10-CM | POA: Diagnosis not present

## 2023-02-27 DIAGNOSIS — N182 Chronic kidney disease, stage 2 (mild): Secondary | ICD-10-CM | POA: Diagnosis not present

## 2023-02-27 DIAGNOSIS — I5032 Chronic diastolic (congestive) heart failure: Secondary | ICD-10-CM | POA: Diagnosis not present

## 2023-02-27 DIAGNOSIS — E7800 Pure hypercholesterolemia, unspecified: Secondary | ICD-10-CM | POA: Diagnosis not present

## 2023-02-27 LAB — GLUCOSE, CAPILLARY
Glucose-Capillary: 168 mg/dL — ABNORMAL HIGH (ref 70–99)
Glucose-Capillary: 335 mg/dL — ABNORMAL HIGH (ref 70–99)
Glucose-Capillary: 220 mg/dL — ABNORMAL HIGH (ref 70–99)
Glucose-Capillary: 150 mg/dL — ABNORMAL HIGH (ref 70–99)

## 2023-02-27 MED ORDER — POLYETHYLENE GLYCOL 3350 17 G PO PACK
17.0000 g | PACK | Freq: Two times a day (BID) | ORAL | Status: DC
  Administered 2023-02-27: 17 g via ORAL
  Filled 2023-02-27 (×2): qty 1

## 2023-02-27 MED ORDER — SODIUM CHLORIDE 0.9 % IV SOLN: INTRAVENOUS | Status: AC

## 2023-02-27 MED ORDER — POTASSIUM CHLORIDE CRYS ER 20 MEQ PO TBCR
40.0000 meq | EXTENDED_RELEASE_TABLET | Freq: Two times a day (BID) | ORAL | Status: AC
  Administered 2023-02-27 (×2): 40 meq via ORAL
  Filled 2023-02-27 (×2): qty 2

## 2023-02-27 MED ORDER — GUAIFENESIN 100 MG/5ML PO LIQD
5.0000 mL | ORAL | Status: DC | PRN
  Administered 2023-02-27 – 2023-02-28 (×2): 5 mL via ORAL
  Filled 2023-02-27 (×2): qty 10

## 2023-02-27 NOTE — Progress Notes (Signed)
[facility] PROGRESS NOTE    Progress Note  [name] [name]  ATF:[id number] DOB: 03-Jul-1934 DOA: 02/24/2023 PCP: [name], MD     Brief Narrative:   [name] [name] is an 89 y.o. female past medical history significant for dementia, on steroids and Plaquenil for dermatomyositis chronic diastolic heart failure, insulin-dependent diabetes mellitus type 2, history of CVA chronic kidney disease stage II, recently discharged from the hospital for aspiration pneumonia last week he completed a course of antibiotics comes in with weakness, most of the history was obtained from the daughter relates she has had a productive cough, she requires assistance for transfers and her ADLs mostly bedbound, was COVID-positive in the ED   Assessment/Plan:   COVID-19 She is not hypoxic steroids were not started. Cont on Paxil.  Her eating is not excellent. She has remained afebrile, with no leukocytosis. Continue conservative management. PT OT eval, will need home health.  Recurrent Oral thrush: Oral thrush is improved continue Diflucan and oral nystatin.  Hypokalemia:  Replete orally.  Hypovolemic hyponatremia: Started on IV fluids.  Uncontrolled type 2 diabetes mellitus with hypoglycemia, with long-term current use  of insulin (HCC) Her last A1c was 8.4. Low blood glucose this morning decrease long-acting insulin continue sliding scale.  Hypertension with hx of orthostatic hypotension Continue midodrine.  Hypothyroidism, acquired Continue Synthroid.  Dermatopolymyositis, unspecified, organ involvement unspecified (HCC) Hold immunosuppressive therapy Plaquenil in the setting of COVID. Continue chronic prednisone.  Generalized weakness: Multifactorial due to recent pneumonia and new COVID-19.  Moderate protein caloric malnutrition: Continue Ensure with meals.  History of seizure Continue Keppra.  Peripheral arterial disease: Continue aspirin and Plavix.  Stage I sacral  decubitus ulcer present on admission: Turn patient every 2 hours and try to mobilize out of bed to chair. Consult physical therapy. RN Pressure Injury Documentation: Pressure Injury 10/02/22 Coccyx Medial Stage 2 -  Partial thickness loss of dermis presenting as a shallow open injury with a red, pink wound bed without slough. (Active)  10/02/22 2055  Location: Coccyx  Location Orientation: Medial  Staging: Stage 2 -  Partial thickness loss of dermis presenting as a shallow open injury with a red, pink wound bed without slough.  Wound Description (Comments):   Present on Admission: Yes     Pressure Injury 10/02/22 Heel Left;Right Stage 1 -  Intact skin with non-blanchable redness of a localized area usually over a bony prominence. Bilateral heel redness, prophylactic drsg applied, heels floating (Active)  10/02/22 2055  Location: Heel  Location Orientation: Left;Right  Staging: Stage 1 -  Intact skin with non-blanchable redness of a localized area usually over a bony prominence.  Wound Description (Comments): Bilateral heel redness, prophylactic drsg applied, heels floating  Present on Admission: Yes     Pressure Injury 10/02/22 Knee Anterior;Left Stage 2 -  Partial thickness loss of dermis presenting as a shallow open injury with a red, pink wound bed without slough. 2 open areas above left knee (Active)  10/02/22 2055  Location: Knee  Location Orientation: Anterior;Left  Staging: Stage 2 -  Partial thickness loss of dermis presenting as a shallow open injury with a red, pink wound bed without slough.  Wound Description (Comments): 2 open areas above left knee  Present on Admission: Yes     Pressure Injury 10/02/22 Toe (Comment  which one) Anterior;Right Stage 1 -  Intact skin with non-blanchable redness of a localized area usually over a bony prominence. right bunion redness (Active)  10/02/22 2055  Location: Toe (Comment  which one)  Location Orientation: Anterior;Right  Staging:  Stage 1 -  Intact skin with non-blanchable redness of a localized area usually over a bony prominence.  Wound Description (Comments): right bunion redness  Present on Admission: Yes     Pressure Injury 10/02/22 Back Left;Upper Stage 1 -  Intact skin with non-blanchable redness of a localized area usually over a bony prominence. erythema to bony prominence of left shoulder blade (Active)  10/02/22 2055  Location: Back  Location Orientation: Left;Upper  Staging: Stage 1 -  Intact skin with non-blanchable redness of a localized area usually over a bony prominence.  Wound Description (Comments): erythema to bony prominence of left shoulder blade  Present on Admission: Yes    DVT prophylaxis: lovenox Family Communication:daughter Status is: Observation The patient remains OBS appropriate and will d/c before 2 midnights.    Code Status:     Code Status Orders  (From admission, onward)           Start     Ordered   02/25/23 0006  Do not attempt resuscitation (DNR)  Continuous       Question Answer Comment  If patient has no pulse and is not breathing Do Not Attempt Resuscitation   If patient has a pulse and/or is breathing: Medical Treatment Goals LIMITED ADDITIONAL INTERVENTIONS: Use medication/IV fluids and cardiac monitoring as indicated; Do not use intubation or mechanical ventilation (DNI), also provide comfort medications.  Transfer to Progressive/Stepdown as indicated, avoid Intensive Care.   Consent: Discussion documented in EHR or advanced directives reviewed      02/25/23 0006           Code Status History     Date Active Date Inactive Code Status Order ID Comments User Context   02/15/2023 0120 02/17/2023 2007 DNR [id number]  Darlin Drop, DO ED   02/08/2023 1730 02/09/2023 1537 Full Code [id number]  Chuck Hint, MD Inpatient   10/02/2022 1929 10/05/2022 1859 DNR [id number]  Orland Mustard, MD ED   09/10/2022 1409 09/11/2022 2127 DNR [id number]  Emeline General,  MD ED   07/27/2022 2317 07/30/2022 2004 DNR [id number]  Charlsie Quest, MD ED   05/09/2022 2153 05/13/2022 2041 Full Code [id number]  Darlin Drop, DO ED   01/13/2022 1556 01/20/2022 1935 DNR [id number]  Teddy Spike, DO Inpatient   09/13/2021 1921 09/16/2021 1758 DNR [id number]  Rolly Salter, MD ED   09/13/2021 1901 09/13/2021 1921 Full Code [id number]  Rolly Salter, MD ED   10/08/2019 1040 10/12/2019 1639 Full Code [id number]  Arline Asp, NP ED   08/21/2019 1118 08/23/2019 2332 Full Code [id number]  Clydie Braun, MD ED   08/18/2019 0602 08/20/2019 1748 Full Code [id number]  John Giovanni, MD Inpatient   11/26/2016 0048 12/09/2016 1943 Full Code [id number]  Alberteen Sam, MD Inpatient   06/10/2016 1355 06/11/2016 1537 Full Code [id number]  Marcos Eke, PA-C ED         IV Access:   Peripheral IV   Procedures and diagnostic studies:   No results found.   Medical Consultants:   None.   Subjective:    Diera M Burcher sleepy this morning but awake has no new complaints.  Objective:    Vitals:   02/26/23 1034 02/26/23 1948 02/26/23 2219 02/27/23 0523  BP: (!) 163/59 (!) 157/69 (!) 161/73 (!) 161/60  Pulse: 65 64 (!) 59 66  Resp: 17 18 17 17   Temp:  98.5 F (36.9 C) 98.2 F (36.8 C) 98.3 F (36.8 C) 98.9 F (37.2 C)  TempSrc:  Oral    SpO2: 100% 100% 100% 99%   SpO2: 99 %   Intake/Output Summary (Last 24 hours) at 02/27/2023 8413 Last data filed at 02/27/2023 0530 Gross per 24 hour  Intake 300 ml  Output 2400 ml  Net -2100 ml   There were no vitals filed for this visit.  Exam: General exam: In no acute distress. Respiratory system: Good air movement and clear to auscultation. Cardiovascular system: S1 & S2 heard, RRR. No JVD. Gastrointestinal system: Abdomen is nondistended, soft and nontender.  Extremities: No pedal edema. Skin: No rashes, lesions or ulcers Psychiatry: Judgement and insight appear normal. Mood & affect appropriate.  Data  Reviewed:    Labs: Basic Metabolic Panel: Recent Labs  Lab 02/24/23 1454 02/25/23 0702 02/27/23 0545  NA 133* 137 134*  K 4.3 3.1* 3.3*  CL 96* 104 97*  CO2 27 25 26   GLUCOSE 180* 133* 124*  BUN 10 7* 12  CREATININE 0.81 0.49 0.74  CALCIUM 9.2 8.0* 9.0   GFR Estimated Creatinine Clearance: 43.6 mL/min (by C-G formula based on SCr of 0.74 mg/dL). Liver Function Tests: Recent Labs  Lab 02/24/23 1630  AST 35  ALT 22  ALKPHOS 68  BILITOT 0.6  PROT 6.6  ALBUMIN 3.0*   No results for input(s): "LIPASE", "AMYLASE" in the last 168 hours. No results for input(s): "AMMONIA" in the last 168 hours. Coagulation profile No results for input(s): "INR", "PROTIME" in the last 168 hours. COVID-19 Labs  No results for input(s): "DDIMER", "FERRITIN", "LDH", "CRP" in the last 72 hours.  Lab Results  Component Value Date   SARSCOV2NAA POSITIVE (A) 02/24/2023   SARSCOV2NAA NEGATIVE 02/14/2023   SARSCOV2NAA NEGATIVE 01/04/2023   SARSCOV2NAA NEGATIVE 10/02/2022    CBC: Recent Labs  Lab 02/24/23 1454 02/25/23 0702  WBC 8.1 7.0  HGB 8.6* 7.1*  HCT 26.6* 22.4*  MCV 99.6 101.4*  PLT 456* 272   Cardiac Enzymes: No results for input(s): "CKTOTAL", "CKMB", "CKMBINDEX", "TROPONINI" in the last 168 hours. BNP (last 3 results) No results for input(s): "PROBNP" in the last 8760 hours. CBG: Recent Labs  Lab 02/26/23 0741 02/26/23 0847 02/26/23 1211 02/26/23 1629 02/26/23 2216  GLUCAP 52* 96 292* 232* 126*   D-Dimer: No results for input(s): "DDIMER" in the last 72 hours. Hgb A1c: Recent Labs    02/26/23 0055  HGBA1C 8.8*   Lipid Profile: No results for input(s): "CHOL", "HDL", "LDLCALC", "TRIG", "CHOLHDL", "LDLDIRECT" in the last 72 hours. Thyroid function studies: No results for input(s): "TSH", "T4TOTAL", "T3FREE", "THYROIDAB" in the last 72 hours.  Invalid input(s): "FREET3" Anemia work up: No results for input(s): "VITAMINB12", "FOLATE", "FERRITIN", "TIBC",  "IRON", "RETICCTPCT" in the last 72 hours. Sepsis Labs: Recent Labs  Lab 02/24/23 1454 02/25/23 0702  WBC 8.1 7.0   Microbiology Recent Results (from the past 240 hour(s))  SARS Coronavirus 2 by RT PCR (hospital order, performed in [facility] hospital lab) *cepheid single result test* Anterior Nasal Swab     Status: Abnormal   Collection Time: 02/24/23  4:26 PM   Specimen: Anterior Nasal Swab  Result Value Ref Range Status   SARS Coronavirus 2 by RT PCR POSITIVE (A) NEGATIVE Final    Comment: (NOTE) SARS-CoV-2 target nucleic acids are DETECTED  SARS-CoV-2 RNA is generally detectable in upper respiratory specimens  during the acute phase of infection.  Positive results are indicative  of the presence of the identified virus, but do not rule out bacterial infection or co-infection with other pathogens not detected by the test.  Clinical correlation with patient history and  other diagnostic information is necessary to determine patient infection status.  The expected result is negative.  Fact Sheet for Patients:   RoadLapTop.co.za   Fact Sheet for Healthcare Providers:   http://kim-miller.com/    This test is not yet approved or cleared by the [place] FDA and  has been authorized for detection and/or diagnosis of SARS-CoV-2 by FDA under an Emergency Use Authorization (EUA).  This EUA will remain in effect (meaning this test can be used) for the duration of  the COVID-19 declaration under Section 564(b)(1)  of the Act, 21 U.S.C. section 360-bbb-3(b)(1), unless the authorization is terminated or revoked sooner.   Performed at [facility], [address] [place], [place] [address]      Medications:    aspirin EC  81 mg Oral Daily   clopidogrel  75 mg Oral Q breakfast   enoxaparin (LOVENOX) injection  40 mg Subcutaneous Q24H   ezetimibe  10 mg Oral Daily   famotidine  20 mg Oral QHS   feeding  supplement  237 mL Oral BID BM   ferrous sulfate  330 mg Oral Once per day on Monday Wednesday Friday   fluconazole  100 mg Oral Daily   folic acid  1 mg Oral QHS   insulin aspart  0-5 Units Subcutaneous QHS   insulin aspart  0-9 Units Subcutaneous TID WC   insulin aspart  3 Units Subcutaneous TID WC   insulin detemir  5 Units Subcutaneous Daily   levETIRAcetam  500 mg Oral BID   levothyroxine  100 mcg Oral Q0600   loratadine  10 mg Oral Daily   midodrine  2.5 mg Oral TID WC   nirmatrelvir/ritonavir (renal dosing)  2 tablet Oral BID   nystatin  5 mL Oral QID   predniSONE  5 mg Oral Q breakfast   Continuous Infusions:      LOS: 0 days   Marinda Elk  [facility]  02/27/2023, 7:21 AM

## 2023-02-28 DIAGNOSIS — U071 COVID-19: Secondary | ICD-10-CM | POA: Diagnosis not present

## 2023-02-28 DIAGNOSIS — E876 Hypokalemia: Secondary | ICD-10-CM

## 2023-02-28 DIAGNOSIS — E871 Hypo-osmolality and hyponatremia: Secondary | ICD-10-CM | POA: Diagnosis not present

## 2023-02-28 LAB — BASIC METABOLIC PANEL WITH GFR
Anion gap: 9 (ref 5–15)
BUN: 12 mg/dL (ref 8–23)
CO2: 26 mmol/L (ref 22–32)
Calcium: 9.1 mg/dL (ref 8.9–10.3)
Chloride: 102 mmol/L (ref 98–111)
Creatinine, Ser: 0.5 mg/dL (ref 0.44–1.00)
GFR, Estimated: >60 mL/min (ref >60)
Glucose, Bld: 48 mg/dL — ABNORMAL LOW (ref 70–99)
Potassium: 3.1 mmol/L — ABNORMAL LOW (ref 3.5–5.1)
Sodium: 137 mmol/L (ref 135–145)

## 2023-02-28 LAB — GLUCOSE, CAPILLARY
Glucose-Capillary: 76 mg/dL (ref 70–99)
Glucose-Capillary: 394 mg/dL — ABNORMAL HIGH (ref 70–99)
Glucose-Capillary: 210 mg/dL — ABNORMAL HIGH (ref 70–99)
Glucose-Capillary: 45 mg/dL — ABNORMAL LOW (ref 70–99)
Glucose-Capillary: 125 mg/dL — ABNORMAL HIGH (ref 70–99)
Glucose-Capillary: 63 mg/dL — ABNORMAL LOW (ref 70–99)

## 2023-02-28 MED ORDER — POTASSIUM CHLORIDE CRYS ER 20 MEQ PO TBCR
40.0000 meq | EXTENDED_RELEASE_TABLET | Freq: Two times a day (BID) | ORAL | Status: AC
  Administered 2023-02-28 (×2): 40 meq via ORAL
  Filled 2023-02-28 (×2): qty 2

## 2023-02-28 NOTE — Plan of Care (Signed)
  Problem: Respiratory: Goal: Will maintain a patent airway Outcome: Progressing   Problem: Nutritional: Goal: Maintenance of adequate nutrition will improve Outcome: Progressing   Problem: Nutritional: Goal: Progress toward achieving an optimal weight will improve Outcome: Progressing   Problem: Skin Integrity: Goal: Risk for impaired skin integrity will decrease Outcome: Progressing   Problem: Tissue Perfusion: Goal: Adequacy of tissue perfusion will improve Outcome: Progressing   Problem: Pain Managment: Goal: General experience of comfort will improve Outcome: Progressing   Problem: Safety: Goal: Ability to remain free from injury will improve Outcome: Progressing

## 2023-02-28 NOTE — Progress Notes (Signed)
[facility] PROGRESS NOTE    Progress Note  [name] [name]  XWR:[id number] DOB: 1933-08-02 DOA: 02/24/2023 PCP: [name], MD     Brief Narrative:   [name] [name] is an 89 y.o. female past medical history significant for dementia, on steroids and Plaquenil for dermatomyositis chronic diastolic heart failure, insulin-dependent diabetes mellitus type 2, history of CVA chronic kidney disease stage II, recently discharged from the hospital for aspiration pneumonia last week he completed a course of antibiotics comes in with weakness, most of the history was obtained from the daughter relates she has had a productive cough, she requires assistance for transfers and her ADLs mostly bedbound, was COVID-positive in the ED   Assessment/Plan:   COVID-19 She is not hypoxic steroids were not started. We have discontinued Paxil as it interferes with Plavix she has remained satting greater 90% on room air. She has remained afebrile, with no leukocytosis. Continue conservative management. PT OT eval, will need home health. Hopefully home tomorrow.  Recurrent Oral thrush: Oral thrush is improved continue Diflucan and oral nystatin.  Hypokalemia:  This to be significantly low, replete orally recheck in the morning.  Hypovolemic hyponatremia: Started on IV fluids.  Uncontrolled type 2 diabetes mellitus with hypoglycemia, with long-term current use  of insulin (HCC) Her last A1c was 8.4. Continue long-acting insulin plus sliding scale.  Hypertension with hx of orthostatic hypotension Continue midodrine.  Hypothyroidism, acquired Continue Synthroid.  Dermatopolymyositis, unspecified, organ involvement unspecified (HCC) Hold immunosuppressive therapy Plaquenil in the setting of COVID. Continue chronic prednisone.  Generalized weakness: Multifactorial due to recent pneumonia and new COVID-19.  Moderate protein caloric malnutrition: Continue Ensure with meals.  History of  seizure Continue Keppra.  Peripheral arterial disease: Continue aspirin and Plavix.  Stage I sacral decubitus ulcer present on admission: Turn patient every 2 hours and try to mobilize out of bed to chair. Consult physical therapy. RN Pressure Injury Documentation: Pressure Injury 10/02/22 Coccyx Medial Stage 2 -  Partial thickness loss of dermis presenting as a shallow open injury with a red, pink wound bed without slough. (Active)  10/02/22 2055  Location: Coccyx  Location Orientation: Medial  Staging: Stage 2 -  Partial thickness loss of dermis presenting as a shallow open injury with a red, pink wound bed without slough.  Wound Description (Comments):   Present on Admission: Yes     Pressure Injury 10/02/22 Heel Left;Right Stage 1 -  Intact skin with non-blanchable redness of a localized area usually over a bony prominence. Bilateral heel redness, prophylactic drsg applied, heels floating (Active)  10/02/22 2055  Location: Heel  Location Orientation: Left;Right  Staging: Stage 1 -  Intact skin with non-blanchable redness of a localized area usually over a bony prominence.  Wound Description (Comments): Bilateral heel redness, prophylactic drsg applied, heels floating  Present on Admission: Yes     Pressure Injury 10/02/22 Knee Anterior;Left Stage 2 -  Partial thickness loss of dermis presenting as a shallow open injury with a red, pink wound bed without slough. 2 open areas above left knee (Active)  10/02/22 2055  Location: Knee  Location Orientation: Anterior;Left  Staging: Stage 2 -  Partial thickness loss of dermis presenting as a shallow open injury with a red, pink wound bed without slough.  Wound Description (Comments): 2 open areas above left knee  Present on Admission: Yes     Pressure Injury 10/02/22 Toe (Comment  which one) Anterior;Right Stage 1 -  Intact skin with non-blanchable redness of  a localized area usually over a bony prominence. right bunion redness (Active)   10/02/22 2055  Location: Toe (Comment  which one)  Location Orientation: Anterior;Right  Staging: Stage 1 -  Intact skin with non-blanchable redness of a localized area usually over a bony prominence.  Wound Description (Comments): right bunion redness  Present on Admission: Yes     Pressure Injury 10/02/22 Back Left;Upper Stage 1 -  Intact skin with non-blanchable redness of a localized area usually over a bony prominence. erythema to bony prominence of left shoulder blade (Active)  10/02/22 2055  Location: Back  Location Orientation: Left;Upper  Staging: Stage 1 -  Intact skin with non-blanchable redness of a localized area usually over a bony prominence.  Wound Description (Comments): erythema to bony prominence of left shoulder blade  Present on Admission: Yes    DVT prophylaxis: lovenox Family Communication:daughter Status is: Observation The patient remains OBS appropriate and will d/c before 2 midnights.    Code Status:     Code Status Orders  (From admission, onward)           Start     Ordered   02/25/23 0006  Do not attempt resuscitation (DNR)  Continuous       Question Answer Comment  If patient has no pulse and is not breathing Do Not Attempt Resuscitation   If patient has a pulse and/or is breathing: Medical Treatment Goals LIMITED ADDITIONAL INTERVENTIONS: Use medication/IV fluids and cardiac monitoring as indicated; Do not use intubation or mechanical ventilation (DNI), also provide comfort medications.  Transfer to Progressive/Stepdown as indicated, avoid Intensive Care.   Consent: Discussion documented in EHR or advanced directives reviewed      02/25/23 0006           Code Status History     Date Active Date Inactive Code Status Order ID Comments User Context   02/15/2023 0120 02/17/2023 2007 DNR [id number]  Darlin Drop, DO ED   02/08/2023 1730 02/09/2023 1537 Full Code [id number]  Chuck Hint, MD Inpatient   10/02/2022 1929 10/05/2022  1859 DNR [id number]  Orland Mustard, MD ED   09/10/2022 1409 09/11/2022 2127 DNR [id number]  Emeline General, MD ED   07/27/2022 2317 07/30/2022 2004 DNR [id number]  Charlsie Quest, MD ED   05/09/2022 2153 05/13/2022 2041 Full Code [id number]  Darlin Drop, DO ED   01/13/2022 1556 01/20/2022 1935 DNR [id number]  Teddy Spike, DO Inpatient   09/13/2021 1921 09/16/2021 1758 DNR [id number]  Rolly Salter, MD ED   09/13/2021 1901 09/13/2021 1921 Full Code [id number]  Rolly Salter, MD ED   10/08/2019 1040 10/12/2019 1639 Full Code [id number]  Arline Asp, NP ED   08/21/2019 1118 08/23/2019 2332 Full Code [id number]  Clydie Braun, MD ED   08/18/2019 0602 08/20/2019 1748 Full Code [id number]  John Giovanni, MD Inpatient   11/26/2016 0048 12/09/2016 1943 Full Code [id number]  Alberteen Sam, MD Inpatient   06/10/2016 1355 06/11/2016 1537 Full Code [id number]  Marcos Eke, PA-C ED         IV Access:   Peripheral IV   Procedures and diagnostic studies:   No results found.   Medical Consultants:   None.   Subjective:    Dianah Field complaints today..  Objective:    Vitals:   02/27/23 0523 02/27/23 1235 02/27/23 2121 02/28/23 0639  BP: (!) 161/60 (!) 155/73 (!) 155/69 (!) 153/58  Pulse: 66 71 (!) 57  63  Resp: 17 18 16 18   Temp: 98.9 F (37.2 C) 98.7 F (37.1 C) 98.1 F (36.7 C) 98.7 F (37.1 C)  TempSrc:  Oral    SpO2: 99% 100% 100% 100%   SpO2: 100 %   Intake/Output Summary (Last 24 hours) at 02/28/2023 0853 Last data filed at 02/28/2023 0644 Gross per 24 hour  Intake 2033.85 ml  Output 1500 ml  Net 533.85 ml   There were no vitals filed for this visit.  Exam: General exam: In no acute distress. Respiratory system: Good air movement and clear to auscultation. Cardiovascular system: S1 & S2 heard, RRR. No JVD. Gastrointestinal system: Abdomen is nondistended, soft and nontender.  Extremities: No pedal edema. Skin: No rashes, lesions or  ulcers Psychiatry: Judgement and insight appear normal. Mood & affect appropriate.  Data Reviewed:    Labs: Basic Metabolic Panel: Recent Labs  Lab 02/24/23 1454 02/25/23 0702 02/27/23 0545 02/28/23 0516  NA 133* 137 134* 137  K 4.3 3.1* 3.3* 3.1*  CL 96* 104 97* 102  CO2 27 25 26 26   GLUCOSE 180* 133* 124* 48*  BUN 10 7* 12 12  CREATININE 0.81 0.49 0.74 0.50  CALCIUM 9.2 8.0* 9.0 9.1   GFR Estimated Creatinine Clearance: 43.6 mL/min (by C-G formula based on SCr of 0.5 mg/dL). Liver Function Tests: Recent Labs  Lab 02/24/23 1630  AST 35  ALT 22  ALKPHOS 68  BILITOT 0.6  PROT 6.6  ALBUMIN 3.0*   No results for input(s): "LIPASE", "AMYLASE" in the last 168 hours. No results for input(s): "AMMONIA" in the last 168 hours. Coagulation profile No results for input(s): "INR", "PROTIME" in the last 168 hours. COVID-19 Labs  No results for input(s): "DDIMER", "FERRITIN", "LDH", "CRP" in the last 72 hours.  Lab Results  Component Value Date   SARSCOV2NAA POSITIVE (A) 02/24/2023   SARSCOV2NAA NEGATIVE 02/14/2023   SARSCOV2NAA NEGATIVE 01/04/2023   SARSCOV2NAA NEGATIVE 10/02/2022    CBC: Recent Labs  Lab 02/24/23 1454 02/25/23 0702  WBC 8.1 7.0  HGB 8.6* 7.1*  HCT 26.6* 22.4*  MCV 99.6 101.4*  PLT 456* 272   Cardiac Enzymes: No results for input(s): "CKTOTAL", "CKMB", "CKMBINDEX", "TROPONINI" in the last 168 hours. BNP (last 3 results) No results for input(s): "PROBNP" in the last 8760 hours. CBG: Recent Labs  Lab 02/27/23 0812 02/27/23 1231 02/27/23 1659 02/27/23 2116 02/28/23 0741  GLUCAP 168* 335* 220* 150* 76   D-Dimer: No results for input(s): "DDIMER" in the last 72 hours. Hgb A1c: Recent Labs    02/26/23 0055  HGBA1C 8.8*   Lipid Profile: No results for input(s): "CHOL", "HDL", "LDLCALC", "TRIG", "CHOLHDL", "LDLDIRECT" in the last 72 hours. Thyroid function studies: No results for input(s): "TSH", "T4TOTAL", "T3FREE", "THYROIDAB" in  the last 72 hours.  Invalid input(s): "FREET3" Anemia work up: No results for input(s): "VITAMINB12", "FOLATE", "FERRITIN", "TIBC", "IRON", "RETICCTPCT" in the last 72 hours. Sepsis Labs: Recent Labs  Lab 02/24/23 1454 02/25/23 0702  WBC 8.1 7.0   Microbiology Recent Results (from the past 240 hour(s))  SARS Coronavirus 2 by RT PCR (hospital order, performed in [facility] hospital lab) *cepheid single result test* Anterior Nasal Swab     Status: Abnormal   Collection Time: 02/24/23  4:26 PM   Specimen: Anterior Nasal Swab  Result Value Ref Range Status   SARS Coronavirus 2 by RT PCR POSITIVE (A) NEGATIVE Final    Comment: (NOTE) SARS-CoV-2 target nucleic acids are DETECTED  SARS-CoV-2 RNA  is generally detectable in upper respiratory specimens  during the acute phase of infection.  Positive results are indicative  of the presence of the identified virus, but do not rule out bacterial infection or co-infection with other pathogens not detected by the test.  Clinical correlation with patient history and  other diagnostic information is necessary to determine patient infection status.  The expected result is negative.  Fact Sheet for Patients:   RoadLapTop.co.za   Fact Sheet for Healthcare Providers:   http://kim-miller.com/    This test is not yet approved or cleared by the [place] FDA and  has been authorized for detection and/or diagnosis of SARS-CoV-2 by FDA under an Emergency Use Authorization (EUA).  This EUA will remain in effect (meaning this test can be used) for the duration of  the COVID-19 declaration under Section 564(b)(1)  of the Act, 21 U.S.C. section 360-bbb-3(b)(1), unless the authorization is terminated or revoked sooner.   Performed at [facility], [address], [place], [place] [address]      Medications:    aspirin EC  81 mg Oral Daily   clopidogrel  75 mg Oral Q breakfast    enoxaparin (LOVENOX) injection  40 mg Subcutaneous Q24H   ezetimibe  10 mg Oral Daily   famotidine  20 mg Oral QHS   feeding supplement  237 mL Oral BID BM   ferrous sulfate  330 mg Oral Once per day on Monday Wednesday Friday   fluconazole  100 mg Oral Daily   folic acid  1 mg Oral QHS   insulin aspart  0-5 Units Subcutaneous QHS   insulin aspart  0-9 Units Subcutaneous TID WC   insulin aspart  3 Units Subcutaneous TID WC   insulin detemir  5 Units Subcutaneous Daily   levETIRAcetam  500 mg Oral BID   levothyroxine  100 mcg Oral Q0600   loratadine  10 mg Oral Daily   midodrine  2.5 mg Oral TID WC   nystatin  5 mL Oral QID   polyethylene glycol  17 g Oral BID   potassium chloride  40 mEq Oral BID   predniSONE  5 mg Oral Q breakfast   Continuous Infusions:      LOS: 1 day   Marinda Elk  [facility]  02/28/2023, 8:53 AM

## 2023-03-01 DIAGNOSIS — E11649 Type 2 diabetes mellitus with hypoglycemia without coma: Secondary | ICD-10-CM | POA: Diagnosis not present

## 2023-03-01 DIAGNOSIS — U071 COVID-19: Secondary | ICD-10-CM | POA: Diagnosis not present

## 2023-03-01 DIAGNOSIS — E1159 Type 2 diabetes mellitus with other circulatory complications: Secondary | ICD-10-CM | POA: Diagnosis not present

## 2023-03-01 DIAGNOSIS — Z794 Long term (current) use of insulin: Secondary | ICD-10-CM | POA: Diagnosis not present

## 2023-03-01 LAB — GLUCOSE, CAPILLARY
Glucose-Capillary: 328 mg/dL — ABNORMAL HIGH (ref 70–99)
Glucose-Capillary: 214 mg/dL — ABNORMAL HIGH (ref 70–99)

## 2023-03-01 MED ORDER — FLUCONAZOLE 100 MG PO TABS: 100.0000 mg | ORAL_TABLET | Freq: Every day | ORAL | 0 refills | Status: AC

## 2023-03-01 MED ORDER — INSULIN DETEMIR 100 UNIT/ML ~~LOC~~ SOLN
5.0000 [IU] | Freq: Two times a day (BID) | SUBCUTANEOUS | Status: DC
  Administered 2023-03-01: 5 [IU] via SUBCUTANEOUS
  Filled 2023-03-01 (×2): qty 0.05

## 2023-03-01 NOTE — Discharge Summary (Signed)
Physician Discharge Summary  [name] [name] ZOX:[id number] DOB: [birth year] DOA: 02/24/2023  PCP: [name], MD  Admit date: 02/24/2023 Discharge date: 03/01/2023  Admitted From: Home Disposition:  home  Recommendations for Outpatient Follow-up:  Follow up with PCP in 1-2 weeks Please obtain BMP/CBC in one week   Home Health:Yes Equipment/Devices:None  Discharge Condition:Guarded CODE STATUS: DNR Diet recommendation: Heart Healthy   Brief/Interim Summary: 89 y.o. female past medical history significant for dementia, on steroids and Plaquenil for dermatomyositis chronic diastolic heart failure, insulin-dependent diabetes mellitus type 2, history of CVA chronic kidney disease stage II, recently discharged from the hospital for aspiration pneumonia last week he completed a course of antibiotics comes in with weakness, most of the history was obtained from the daughter relates she has had a productive cough, she requires assistance for transfers and her ADLs mostly bedbound, was COVID-positive in the ED   Discharge Diagnoses:  Principal Problem:   COVID-19 Active Problems:   History of seizure   Uncontrolled type 2 diabetes mellitus with hypoglycemia, with long-term current use of insulin (HCC)   Hypertension with hx of orthostatic hypotension   Hypothyroidism, acquired   Dermatopolymyositis, unspecified, organ involvement unspecified (HCC)   Dementia without behavioral disturbance (HCC)   Pressure injury of skin   PAD (peripheral artery disease) (HCC)   Malnutrition of moderate degree   Weakness  COVID-19 infection: She was not hypoxic on admission so steroids were not started. She was started on Paxlovid but then had to be discontinued as it has interaction with Plavix. She was kept on room air remain afebrile appetite improved. PT evaluated the patient, will need to go home with home health.  Recurrent oral thrush: Was started on Diflucan and nystatin. Improved swallowing  also improved. She will continue Diflucan as an outpatient.  Hypovolemic hyponatremia: Resolved with IV fluids.  Uncontrolled diabetes mellitus type 2: With a last A1c of 8.4: No changes made to her medication.  Static hypotension: No change made to her medication.  Hypothyroidism continue Synthroid.  Dermatop polymyositis: Immunosuppressive therapy held on admission she was continue steroids, she will resume her Plaquenil as an outpatient.  Generalized weakness: Likely due to COVID-19 infection.  Moderate protein caloric malnutrition: Continue Ensure 3 times daily.  History of seizure: Continue Keppra.  Peripheral artery disease: Continue aspirin and Plavix.  Sick decubitus ulcer stage I present on admission: Noted.   Discharge Instructions  Discharge Instructions     Diet - low sodium heart healthy   Complete by: As directed    Discharge wound care:   Complete by: As directed    Per wound care instructions nurse   Increase activity slowly   Complete by: As directed       Allergies as of 03/01/2023       Reactions   Nsaids Other (See Comments)   No NSAIDS due to kidney function- ESPECIALLY ibuprofen or Aleve   Biaxin [clarithromycin] Hives   Statins Other (See Comments)   Muscle pain (myositis per daugher)   Bactrim [sulfamethoxazole-trimethoprim] Hives, Rash   Lisinopril Cough   Sulfa Antibiotics Hives, Rash, Other (See Comments)   NO sulfa-based meds!!        Medication List     TAKE these medications    acetaminophen 650 MG CR tablet Commonly known as: TYLENOL Take 650 mg by mouth every 8 (eight) hours as needed for pain.   Bayer Low Dose 81 MG tablet Generic drug: aspirin EC Take 81 mg by mouth in  the morning. Swallow whole.   cetirizine 10 MG tablet Commonly known as: ZYRTEC Take 10 mg by mouth in the morning, at noon, and at bedtime.   clopidogrel 75 MG tablet Commonly known as: PLAVIX Take 1 tablet (75 mg total) by mouth daily  with breakfast.   collagenase 250 UNIT/GM ointment Commonly known as: SANTYL Apply 1 Application topically 2 (two) times a week. Applied to affected areas of toes/feet.   ezetimibe 10 MG tablet Commonly known as: Zetia Take 1 tablet (10 mg total) by mouth daily.   ferrous sulfate 220 (44 Fe) MG/5ML solution Take 330 mg by mouth 3 (three) times a week.   fluconazole 100 MG tablet Commonly known as: DIFLUCAN Take 1 tablet (100 mg total) by mouth daily for 4 days.   folic acid 1 MG tablet Commonly known as: FOLVITE Take 1 mg by mouth at bedtime.   Levemir FlexTouch 100 UNIT/ML FlexTouch Pen Generic drug: insulin detemir Inject 3-7 Units into the skin 2 (two) times daily. Inject 7 units into the skin in the morning and 3 units at bedtime   levETIRAcetam 500 MG tablet Commonly known as: KEPPRA Take 500 mg by mouth 2 (two) times daily.   levothyroxine 100 MCG tablet Commonly known as: SYNTHROID Take 1 tablet (100 mcg total) by mouth daily before breakfast.   liver oil-zinc oxide 40 % ointment Commonly known as: DESITIN Apply topically 3 (three) times daily. What changed:  how much to take when to take this reasons to take this   methotrexate 2.5 MG tablet Commonly known as: RHEUMATREX Take 1 tablet (2.5 mg total) by mouth once a week. Caution:Chemotherapy. Protect from light. What changed: how much to take   metoprolol tartrate 25 MG tablet Commonly known as: LOPRESSOR Take 1 tablet (25 mg total) by mouth 2 (two) times daily. What changed:  how much to take when to take this reasons to take this   midodrine 5 MG tablet Commonly known as: PROAMATINE Take 0.5 tablets (2.5 mg total) by mouth 3 (three) times daily with meals.   multivitamin with minerals Tabs tablet Take 1 tablet by mouth in the morning.   NovoLOG FlexPen 100 UNIT/ML FlexPen Generic drug: insulin aspart Inject 2-10 Units into the skin See admin instructions. Inject 2-10 units into the skin three  times a day with meals, per sliding scale:  Breakfast: BGL 80-199 = 8 units; 200-299 = 9 units; 300 or greater = 10 units Lunch: BGL 80-199 = 5 units; 200-299 = 6 units; 300 or greater = 7 units Supper/evening meal: BGL 80-199 = 2 units; 200-299 = 3 units; 300 or greater = 4 units   PEPCID AC PO Take 20 mg by mouth at bedtime.   Plaquenil 200 MG tablet Generic drug: hydroxychloroquine Take 200 mg by mouth at bedtime.   predniSONE 5 MG tablet Commonly known as: DELTASONE Take 5 mg by mouth daily with breakfast.   triamcinolone ointment 0.1 % Commonly known as: KENALOG Apply 1 Application topically 2 (two) times daily as needed (skin irritation).   VITAMIN D-3 PO Take 2,000 Units by mouth daily.               Discharge Care Instructions  (From admission, onward)           Start     Ordered   03/01/23 0000  Discharge wound care:       Comments: Per wound care instructions nurse   03/01/23 [phone]  Allergies  Allergen Reactions   Nsaids Other (See Comments)    No NSAIDS due to kidney function- ESPECIALLY ibuprofen or Aleve   Biaxin [Clarithromycin] Hives   Statins Other (See Comments)    Muscle pain (myositis per daugher)   Bactrim [Sulfamethoxazole-Trimethoprim] Hives and Rash   Lisinopril Cough   Sulfa Antibiotics Hives, Rash and Other (See Comments)    NO sulfa-based meds!!     Consultations: None   Procedures/Studies: CT HEAD WO CONTRAST ( )  Result Date: 02/24/2023 CLINICAL DATA:  Head trauma, minor (Age >= 65y) Mental status change, unknown cause EXAM: CT HEAD WITHOUT CONTRAST TECHNIQUE: Contiguous axial images were obtained from the base of the skull through the vertex without intravenous contrast. RADIATION DOSE REDUCTION: This exam was performed according to the departmental dose-optimization program which includes automated exposure control, adjustment of the mA and/or kV according to patient size and/or use of iterative  reconstruction technique. COMPARISON:  02/14/2023 FINDINGS: Brain: Mild parenchymal volume loss is commensurate with the patient's age. Moderate bilateral periventricular and deep white matter changes are present likely reflecting the sequela of small vessel ischemia. These appear stable since prior examination. Remote bilateral thalamic lacunar infarcts again noted. No evidence of acute intracranial hemorrhage or infarct. No abnormal mass effect or midline shift. No abnormal intra or extra-axial mass lesion or fluid collection. Ventricular size is normal. Cerebellum is unremarkable. Vascular: No hyperdense vessel or unexpected calcification. Skull: Normal. Negative for fracture or focal lesion. Sinuses/Orbits: No acute finding. Other: Mastoid air cells and middle ear cavities are clear. IMPRESSION: 1. No acute intracranial hemorrhage or infarct. 2. Stable senescent change. 3. Remote bilateral thalamic lacunar infarcts. Electronically Signed   By: Helyn Numbers M.D.   On: 02/24/2023 20:16   DG Chest Port 1 View  Result Date: 02/24/2023 CLINICAL DATA:  Altered mental status EXAM: PORTABLE CHEST 1 VIEW COMPARISON:  02/14/2023 x-ray FINDINGS: Hyperinflation. No consolidation, pneumothorax or effusion. No edema. Normal cardiopericardial silhouette with a calcified aorta. The previous opacity left lung base is no longer seen. Loop recorder overlying the lower left thorax. IMPRESSION: Improved left lung base opacity. Hyperinflation.  No effusion or edema. Loop recorder Electronically Signed   By: Karen Kays M.D.   On: 02/24/2023 17:11   DG Abd 1 View  Result Date: 02/15/2023 CLINICAL DATA:  [id number] Nausea AND vomiting [id number] EXAM: ABDOMEN - 1 VIEW COMPARISON:  09/15/2021. FINDINGS: The bowel gas pattern is non-obstructive. No evidence of pneumoperitoneum, within the limitations of a supine film. No acute osseous abnormalities. There is old fracture deformity of the pubic symphysis and bilateral pubic rami. The  soft tissues are within normal limits. Surgical changes, devices, tubes and lines: Bilateral common iliac artery stents noted. IMPRESSION: *Nonobstructive bowel gas pattern. Electronically Signed   By: Jules Schick M.D.   On: 02/15/2023 16:42   DG Chest Port 1 View  Result Date: 02/14/2023 CLINICAL DATA:  AMS EXAM: PORTABLE CHEST 1 VIEW COMPARISON:  Chest x-ray 01/04/2023 FINDINGS: Correlate cardiac device overlying the left chest The heart and mediastinal contours are within normal limits. Aortic calcification. Biapical pleural/pulmonary scarring. Patchy airspace opacity within the left lower lobe. No pulmonary edema. No pleural effusion. No pneumothorax. No acute osseous abnormality. IMPRESSION: 1. Patchy airspace opacity within the left lower lobe. Followup PA and lateral chest X-ray is recommended in 3-4 weeks following therapy to ensure resolution and exclude underlying malignancy. 2.  Aortic Atherosclerosis (ICD10-I70.0). Electronically Signed   By: Tish Frederickson M.D.   On: 02/14/2023  23:02   CT HEAD WO CONTRAST ( )  Result Date: 02/14/2023 CLINICAL DATA:  Seizure and altered mental status EXAM: CT HEAD WITHOUT CONTRAST TECHNIQUE: Contiguous axial images were obtained from the base of the skull through the vertex without intravenous contrast. RADIATION DOSE REDUCTION: This exam was performed according to the departmental dose-optimization program which includes automated exposure control, adjustment of the mA and/or kV according to patient size and/or use of iterative reconstruction technique. COMPARISON:  None Available. FINDINGS: Brain: There is no mass, hemorrhage or extra-axial collection. The size and configuration of the ventricles and extra-axial CSF spaces are normal. There is hypoattenuation of the white matter, most commonly indicating chronic small vessel disease. Vascular: No abnormal hyperdensity of the major intracranial arteries or dural venous sinuses. No intracranial  atherosclerosis. Skull: The visualized skull base, calvarium and extracranial soft tissues are normal. Sinuses/Orbits: No fluid levels or advanced mucosal thickening of the visualized paranasal sinuses. No mastoid or middle ear effusion. The orbits are normal. IMPRESSION: Chronic small vessel disease without acute intracranial abnormality. Electronically Signed   By: Deatra Robinson M.D.   On: 02/14/2023 22:44   PERIPHERAL VASCULAR CATHETERIZATION  Result Date: 02/08/2023 Table formatting from the original result was not included. PATIENT: EDMEE STONEY      MRN: [id number] DOB: [birth year]    DATE OF PROCEDURE: 02/08/2023 INDICATIONS:  YSABELLE WEDDING is a 89 y.o. female who presents with a wound on her left great toe.  She presents for arteriography and possible intervention. PROCEDURE:  Ultrasound-guided access to the right common femoral artery Aortogram with bilateral iliac arteriogram Ultrasound-guided access in the left common femoral artery Kissing balloon angioplasty of bilateral common iliac artery stenoses (right common iliac artery 8 x 29 VBX stent, left common iliac 8 x 39 VBX stent, postdilatation with 9 mm x 2 cm balloon Bilateral lower extremity runoff SURGEON: Di Kindle. Edilia Bo, MD, FACS ANESTHESIA: Local EBL: Minimal TECHNIQUE: Both groins were prepped and draped in the usual sterile fashion.  Under ultrasound guidance, after the skin was anesthetized, I cannulated the right common femoral artery with a micropuncture needle and a micropuncture sheath was introduced over a wire.  This was exchanged for a 5 [place] sheath over a Bentson wire.  By ultrasound the femoral artery was patent. A real-time image was obtained and sent to the server. The pigtail catheter was positioned at the L1 vertebral body and flush aortogram obtained.  Next the catheter was positioned above the bifurcation and an oblique iliac projection was obtained.  Next bilateral lower extremity runoff films were obtained. Given  that the patient had a slightly diminished left femoral pulse and given the bulky calcific plaque in the common iliac artery elected to address this given that this patient had limb threatening ischemia.  Under ultrasound guidance, after the skin was anesthetized, the left common femoral artery was cannulated with a micropuncture needle and a micropuncture sheath introduced over a wire.  I was then unable to get a Bentson wire through the area of disease but using a Berenstein catheter and angled Glidewire I was ultimately able to get through the disease in the common iliac artery.  A 5 French sheath was placed on the left.  Patient was heparinized and ACT was monitored throughout the procedure. The patient had a bulky plaque that extended up at the bifurcation and therefore I felt kissing balloons was indicated.  7 French sheaths were placed on both sides.  On the right side and 8 mm x  29 mm VBX stent was selected and was positioned across the stenosis extending slightly up into the aorta.  On the left side an 8 mm x 39 mm VBX stent was positioned to match the right common iliac stent at the top.  These were deployed.  Completion film showed that they were would not fully in apposition and therefore I went back with a 9 mm x 2 cm balloon and further dilated both stents.  Although the stents were still slightly undersized given her age I felt it would be safe to stop at this point rather than risk dissection or injury to the aorta or iliac arteries.  The stents were both widely patent with no significant inflow disease.  I did shoot a distal subtraction shot of the left foot to determine if the patient would be a candidate for retrograde intervention if indicated. Patient was transferred to the holding area for removal of the sheath.  No immediate complications were noted. FINDINGS: Single arteries bilaterally with no significant renal artery stenosis identified.  Single renal arteries bilaterally with no  significant renal artery stenosis identified. Plaque extending into the aortic bifurcation with moderate plaque in the right common iliac artery and a bulky calcific plaque in the left common iliac artery..  These were addressed with kissing balloon angioplasty and stenting as described above. On the left side, there is moderate disease throughout the superficial femoral artery and then a tight stenosis at the in the popliteal artery at the level of the knee.  The proximal tibial vessels are all occluded.  There is reconstitution of a small posterior tibial artery on the left a small peroneal and small anterior tibial artery and dorsalis pedis artery. On the right side the common femoral, deep femoral, and superficial femoral artery are patent.  The tibial vessels are all occluded proximally with reconstitution of the anterior tibial artery.  The posterior tibial artery reconstitutes but occludes above the ankle.  The peroneal artery is occluded. CLINICAL NOTE: Her inflow disease on the left has been addressed.  If she continues to have problems with her foot the only remaining option would be a retrograde intervention on the left although these arteries are quite small.  I will review the films with Dr. Randie Heinz. [name], MD, FACS [facility]   (Echo, Carotid, EGD, Colonoscopy, ERCP)    Subjective: No complaints  Discharge Exam: Vitals:   02/28/23 1146 02/28/23 1928  BP: (!) 163/62 (!) 148/70  Pulse: 71 69  Resp: 18 16  Temp: 99.6 F (37.6 C) 98.8 F (37.1 C)  SpO2: 99% 100%   Vitals:   02/27/23 2121 02/28/23 0639 02/28/23 1146 02/28/23 1928  BP: (!) 155/69 (!) 153/58 (!) 163/62 (!) 148/70  Pulse: (!) 57 63 71 69  Resp: 16 18 18 16   Temp: 98.1 F (36.7 C) 98.7 F (37.1 C) 99.6 F (37.6 C) 98.8 F (37.1 C)  TempSrc:   Oral Oral  SpO2: 100% 100% 99% 100%    General: Pt is alert, awake, not in acute distress Cardiovascular: RRR, S1/S2 +, no rubs,  no gallops Respiratory: CTA bilaterally, no wheezing, no rhonchi Abdominal: Soft, NT, ND, bowel sounds + Extremities: no edema, no cyanosis    The results of significant diagnostics from this hospitalization (including imaging, microbiology, ancillary and laboratory) are listed below for reference.     Microbiology: Recent Results (from the past 240 hour(s))  SARS Coronavirus 2 by RT PCR (hospital order, performed in  [facility] hospital lab) *cepheid single result test* Anterior Nasal Swab     Status: Abnormal   Collection Time: 02/24/23  4:26 PM   Specimen: Anterior Nasal Swab  Result Value Ref Range Status   SARS Coronavirus 2 by RT PCR POSITIVE (A) NEGATIVE Final    Comment: (NOTE) SARS-CoV-2 target nucleic acids are DETECTED  SARS-CoV-2 RNA is generally detectable in upper respiratory specimens  during the acute phase of infection.  Positive results are indicative  of the presence of the identified virus, but do not rule out bacterial infection or co-infection with other pathogens not detected by the test.  Clinical correlation with patient history and  other diagnostic information is necessary to determine patient infection status.  The expected result is negative.  Fact Sheet for Patients:   RoadLapTop.co.za   Fact Sheet for Healthcare Providers:   http://kim-miller.com/    This test is not yet approved or cleared by the [place] FDA and  has been authorized for detection and/or diagnosis of SARS-CoV-2 by FDA under an Emergency Use Authorization (EUA).  This EUA will remain in effect (meaning this test can be used) for the duration of  the COVID-19 declaration under Section 564(b)(1)  of the Act, 21 U.S.C. section 360-bbb-3(b)(1), unless the authorization is terminated or revoked sooner.   Performed at [facility], [address], [place], [place] [address]      Labs: BNP (last 3  results) Recent Labs    09/03/22 1230  BNP 123.0*   Basic Metabolic Panel: Recent Labs  Lab 02/24/23 1454 02/25/23 0702 02/27/23 0545 02/28/23 0516  NA 133* 137 134* 137  K 4.3 3.1* 3.3* 3.1*  CL 96* 104 97* 102  CO2 27 25 26 26   GLUCOSE 180* 133* 124* 48*  BUN 10 7* 12 12  CREATININE 0.81 0.49 0.74 0.50  CALCIUM 9.2 8.0* 9.0 9.1   Liver Function Tests: Recent Labs  Lab 02/24/23 1630  AST 35  ALT 22  ALKPHOS 68  BILITOT 0.6  PROT 6.6  ALBUMIN 3.0*   No results for input(s): "LIPASE", "AMYLASE" in the last 168 hours. No results for input(s): "AMMONIA" in the last 168 hours. CBC: Recent Labs  Lab 02/24/23 1454 02/25/23 0702  WBC 8.1 7.0  HGB 8.6* 7.1*  HCT 26.6* 22.4*  MCV 99.6 101.4*  PLT 456* 272   Cardiac Enzymes: No results for input(s): "CKTOTAL", "CKMB", "CKMBINDEX", "TROPONINI" in the last 168 hours. BNP: Invalid input(s): "POCBNP" CBG: Recent Labs  Lab 02/28/23 1629 02/28/23 2016 02/28/23 2031 02/28/23 2048 03/01/23 0748  GLUCAP 210* 45* 63* 125* 328*   D-Dimer No results for input(s): "DDIMER" in the last 72 hours. Hgb A1c No results for input(s): "HGBA1C" in the last 72 hours. Lipid Profile No results for input(s): "CHOL", "HDL", "LDLCALC", "TRIG", "CHOLHDL", "LDLDIRECT" in the last 72 hours. Thyroid function studies No results for input(s): "TSH", "T4TOTAL", "T3FREE", "THYROIDAB" in the last 72 hours.  Invalid input(s): "FREET3" Anemia work up No results for input(s): "VITAMINB12", "FOLATE", "FERRITIN", "TIBC", "IRON", "RETICCTPCT" in the last 72 hours. Urinalysis    Component Value Date/Time   COLORURINE STRAW (A) 02/24/2023 1920   APPEARANCEUR CLEAR 02/24/2023 1920   LABSPEC 1.004 (L) 02/24/2023 1920   PHURINE 8.0 02/24/2023 1920   GLUCOSEU NEGATIVE 02/24/2023 1920   HGBUR NEGATIVE 02/24/2023 1920   BILIRUBINUR NEGATIVE 02/24/2023 1920   BILIRUBINUR neg 05/06/2015 1421   KETONESUR NEGATIVE 02/24/2023 1920   PROTEINUR  NEGATIVE 02/24/2023 1920  UROBILINOGEN 1.0 05/06/2015 1421   UROBILINOGEN 0.2 12/25/2014 1206   NITRITE NEGATIVE 02/24/2023 1920   LEUKOCYTESUR NEGATIVE 02/24/2023 1920   Sepsis Labs Recent Labs  Lab 02/24/23 1454 02/25/23 0702  WBC 8.1 7.0   Microbiology Recent Results (from the past 240 hour(s))  SARS Coronavirus 2 by RT PCR (hospital order, performed in [facility] hospital lab) *cepheid single result test* Anterior Nasal Swab     Status: Abnormal   Collection Time: 02/24/23  4:26 PM   Specimen: Anterior Nasal Swab  Result Value Ref Range Status   SARS Coronavirus 2 by RT PCR POSITIVE (A) NEGATIVE Final    Comment: (NOTE) SARS-CoV-2 target nucleic acids are DETECTED  SARS-CoV-2 RNA is generally detectable in upper respiratory specimens  during the acute phase of infection.  Positive results are indicative  of the presence of the identified virus, but do not rule out bacterial infection or co-infection with other pathogens not detected by the test.  Clinical correlation with patient history and  other diagnostic information is necessary to determine patient infection status.  The expected result is negative.  Fact Sheet for Patients:   RoadLapTop.co.za   Fact Sheet for Healthcare Providers:   http://kim-miller.com/    This test is not yet approved or cleared by the [place] FDA and  has been authorized for detection and/or diagnosis of SARS-CoV-2 by FDA under an Emergency Use Authorization (EUA).  This EUA will remain in effect (meaning this test can be used) for the duration of  the COVID-19 declaration under Section 564(b)(1)  of the Act, 21 U.S.C. section 360-bbb-3(b)(1), unless the authorization is terminated or revoked sooner.   Performed at [facility], [address], [place], [place] [address]    SIGNED:   Marinda Elk, MD  [facility] 03/01/2023, 9:46 AM Pager   If  7PM-7AM, please contact night-coverage [email or web address] Password [email or web address]

## 2023-03-01 NOTE — Plan of Care (Signed)
  Problem: Activity: Goal: Ability to return to baseline activity level will improve Outcome: Progressing   Problem: Respiratory: Goal: Will maintain a patent airway Outcome: Progressing   Problem: Metabolic: Goal: Ability to maintain appropriate glucose levels will improve Outcome: Progressing   Problem: Nutritional: Goal: Maintenance of adequate nutrition will improve Outcome: Progressing   Problem: Activity: Goal: Risk for activity intolerance will decrease Outcome: Progressing   Problem: Nutrition: Goal: Adequate nutrition will be maintained Outcome: Progressing   Problem: Coping: Goal: Level of anxiety will decrease Outcome: Progressing

## 2023-03-02 DIAGNOSIS — E1165 Type 2 diabetes mellitus with hyperglycemia: Secondary | ICD-10-CM | POA: Diagnosis not present

## 2023-03-02 DIAGNOSIS — E119 Type 2 diabetes mellitus without complications: Secondary | ICD-10-CM | POA: Diagnosis not present

## 2023-03-02 DIAGNOSIS — Z794 Long term (current) use of insulin: Secondary | ICD-10-CM | POA: Diagnosis not present

## 2023-03-03 DIAGNOSIS — E1151 Type 2 diabetes mellitus with diabetic peripheral angiopathy without gangrene: Secondary | ICD-10-CM | POA: Diagnosis not present

## 2023-03-03 DIAGNOSIS — E440 Moderate protein-calorie malnutrition: Secondary | ICD-10-CM | POA: Diagnosis not present

## 2023-03-03 DIAGNOSIS — U071 COVID-19: Secondary | ICD-10-CM | POA: Diagnosis not present

## 2023-03-03 DIAGNOSIS — Z7982 Long term (current) use of aspirin: Secondary | ICD-10-CM | POA: Diagnosis not present

## 2023-03-03 DIAGNOSIS — E11649 Type 2 diabetes mellitus with hypoglycemia without coma: Secondary | ICD-10-CM | POA: Diagnosis not present

## 2023-03-03 DIAGNOSIS — M810 Age-related osteoporosis without current pathological fracture: Secondary | ICD-10-CM | POA: Diagnosis not present

## 2023-03-03 DIAGNOSIS — L89152 Pressure ulcer of sacral region, stage 2: Secondary | ICD-10-CM | POA: Diagnosis not present

## 2023-03-03 DIAGNOSIS — Z87440 Personal history of urinary (tract) infections: Secondary | ICD-10-CM | POA: Diagnosis not present

## 2023-03-03 DIAGNOSIS — F0280 Dementia in other diseases classified elsewhere without behavioral disturbance: Secondary | ICD-10-CM | POA: Diagnosis not present

## 2023-03-03 DIAGNOSIS — N182 Chronic kidney disease, stage 2 (mild): Secondary | ICD-10-CM | POA: Diagnosis not present

## 2023-03-03 DIAGNOSIS — Z794 Long term (current) use of insulin: Secondary | ICD-10-CM | POA: Diagnosis not present

## 2023-03-03 DIAGNOSIS — Z993 Dependence on wheelchair: Secondary | ICD-10-CM | POA: Diagnosis not present

## 2023-03-03 DIAGNOSIS — D649 Anemia, unspecified: Secondary | ICD-10-CM | POA: Diagnosis not present

## 2023-03-03 DIAGNOSIS — G8220 Paraplegia, unspecified: Secondary | ICD-10-CM | POA: Diagnosis not present

## 2023-03-03 DIAGNOSIS — L89891 Pressure ulcer of other site, stage 1: Secondary | ICD-10-CM | POA: Diagnosis not present

## 2023-03-03 DIAGNOSIS — E039 Hypothyroidism, unspecified: Secondary | ICD-10-CM | POA: Diagnosis not present

## 2023-03-03 DIAGNOSIS — I5032 Chronic diastolic (congestive) heart failure: Secondary | ICD-10-CM | POA: Diagnosis not present

## 2023-03-03 DIAGNOSIS — E1122 Type 2 diabetes mellitus with diabetic chronic kidney disease: Secondary | ICD-10-CM | POA: Diagnosis not present

## 2023-03-03 DIAGNOSIS — I700 Atherosclerosis of aorta: Secondary | ICD-10-CM | POA: Diagnosis not present

## 2023-03-03 DIAGNOSIS — Z7902 Long term (current) use of antithrombotics/antiplatelets: Secondary | ICD-10-CM | POA: Diagnosis not present

## 2023-03-04 DIAGNOSIS — N182 Chronic kidney disease, stage 2 (mild): Secondary | ICD-10-CM | POA: Diagnosis not present

## 2023-03-04 DIAGNOSIS — E1122 Type 2 diabetes mellitus with diabetic chronic kidney disease: Secondary | ICD-10-CM | POA: Diagnosis not present

## 2023-03-04 DIAGNOSIS — E1151 Type 2 diabetes mellitus with diabetic peripheral angiopathy without gangrene: Secondary | ICD-10-CM | POA: Diagnosis not present

## 2023-03-04 DIAGNOSIS — Z993 Dependence on wheelchair: Secondary | ICD-10-CM | POA: Diagnosis not present

## 2023-03-04 DIAGNOSIS — U071 COVID-19: Secondary | ICD-10-CM | POA: Diagnosis not present

## 2023-03-04 DIAGNOSIS — E11649 Type 2 diabetes mellitus with hypoglycemia without coma: Secondary | ICD-10-CM | POA: Diagnosis not present

## 2023-03-04 DIAGNOSIS — Z794 Long term (current) use of insulin: Secondary | ICD-10-CM | POA: Diagnosis not present

## 2023-03-04 DIAGNOSIS — Z7902 Long term (current) use of antithrombotics/antiplatelets: Secondary | ICD-10-CM | POA: Diagnosis not present

## 2023-03-04 DIAGNOSIS — Z7982 Long term (current) use of aspirin: Secondary | ICD-10-CM | POA: Diagnosis not present

## 2023-03-04 DIAGNOSIS — D649 Anemia, unspecified: Secondary | ICD-10-CM | POA: Diagnosis not present

## 2023-03-04 DIAGNOSIS — L89891 Pressure ulcer of other site, stage 1: Secondary | ICD-10-CM | POA: Diagnosis not present

## 2023-03-04 DIAGNOSIS — G8220 Paraplegia, unspecified: Secondary | ICD-10-CM | POA: Diagnosis not present

## 2023-03-04 DIAGNOSIS — Z87440 Personal history of urinary (tract) infections: Secondary | ICD-10-CM | POA: Diagnosis not present

## 2023-03-04 DIAGNOSIS — M810 Age-related osteoporosis without current pathological fracture: Secondary | ICD-10-CM | POA: Diagnosis not present

## 2023-03-04 DIAGNOSIS — I5032 Chronic diastolic (congestive) heart failure: Secondary | ICD-10-CM | POA: Diagnosis not present

## 2023-03-04 DIAGNOSIS — L89152 Pressure ulcer of sacral region, stage 2: Secondary | ICD-10-CM | POA: Diagnosis not present

## 2023-03-04 DIAGNOSIS — I700 Atherosclerosis of aorta: Secondary | ICD-10-CM | POA: Diagnosis not present

## 2023-03-04 DIAGNOSIS — E039 Hypothyroidism, unspecified: Secondary | ICD-10-CM | POA: Diagnosis not present

## 2023-03-04 DIAGNOSIS — F0280 Dementia in other diseases classified elsewhere without behavioral disturbance: Secondary | ICD-10-CM | POA: Diagnosis not present

## 2023-03-04 DIAGNOSIS — E440 Moderate protein-calorie malnutrition: Secondary | ICD-10-CM | POA: Diagnosis not present

## 2023-03-05 DIAGNOSIS — F0280 Dementia in other diseases classified elsewhere without behavioral disturbance: Secondary | ICD-10-CM | POA: Diagnosis not present

## 2023-03-05 DIAGNOSIS — G8220 Paraplegia, unspecified: Secondary | ICD-10-CM | POA: Diagnosis not present

## 2023-03-05 DIAGNOSIS — Z7902 Long term (current) use of antithrombotics/antiplatelets: Secondary | ICD-10-CM | POA: Diagnosis not present

## 2023-03-05 DIAGNOSIS — E1151 Type 2 diabetes mellitus with diabetic peripheral angiopathy without gangrene: Secondary | ICD-10-CM | POA: Diagnosis not present

## 2023-03-05 DIAGNOSIS — Z794 Long term (current) use of insulin: Secondary | ICD-10-CM | POA: Diagnosis not present

## 2023-03-05 DIAGNOSIS — Z7982 Long term (current) use of aspirin: Secondary | ICD-10-CM | POA: Diagnosis not present

## 2023-03-05 DIAGNOSIS — U071 COVID-19: Secondary | ICD-10-CM | POA: Diagnosis not present

## 2023-03-05 DIAGNOSIS — E039 Hypothyroidism, unspecified: Secondary | ICD-10-CM | POA: Diagnosis not present

## 2023-03-05 DIAGNOSIS — E1122 Type 2 diabetes mellitus with diabetic chronic kidney disease: Secondary | ICD-10-CM | POA: Diagnosis not present

## 2023-03-05 DIAGNOSIS — N182 Chronic kidney disease, stage 2 (mild): Secondary | ICD-10-CM | POA: Diagnosis not present

## 2023-03-05 DIAGNOSIS — D649 Anemia, unspecified: Secondary | ICD-10-CM | POA: Diagnosis not present

## 2023-03-05 DIAGNOSIS — I700 Atherosclerosis of aorta: Secondary | ICD-10-CM | POA: Diagnosis not present

## 2023-03-05 DIAGNOSIS — Z87440 Personal history of urinary (tract) infections: Secondary | ICD-10-CM | POA: Diagnosis not present

## 2023-03-05 DIAGNOSIS — M810 Age-related osteoporosis without current pathological fracture: Secondary | ICD-10-CM | POA: Diagnosis not present

## 2023-03-05 DIAGNOSIS — I5032 Chronic diastolic (congestive) heart failure: Secondary | ICD-10-CM | POA: Diagnosis not present

## 2023-03-05 DIAGNOSIS — L89152 Pressure ulcer of sacral region, stage 2: Secondary | ICD-10-CM | POA: Diagnosis not present

## 2023-03-05 DIAGNOSIS — Z993 Dependence on wheelchair: Secondary | ICD-10-CM | POA: Diagnosis not present

## 2023-03-05 DIAGNOSIS — E440 Moderate protein-calorie malnutrition: Secondary | ICD-10-CM | POA: Diagnosis not present

## 2023-03-05 DIAGNOSIS — L89891 Pressure ulcer of other site, stage 1: Secondary | ICD-10-CM | POA: Diagnosis not present

## 2023-03-05 DIAGNOSIS — E11649 Type 2 diabetes mellitus with hypoglycemia without coma: Secondary | ICD-10-CM | POA: Diagnosis not present

## 2023-03-08 DIAGNOSIS — E1122 Type 2 diabetes mellitus with diabetic chronic kidney disease: Secondary | ICD-10-CM | POA: Diagnosis not present

## 2023-03-08 DIAGNOSIS — N182 Chronic kidney disease, stage 2 (mild): Secondary | ICD-10-CM | POA: Diagnosis not present

## 2023-03-08 DIAGNOSIS — G8220 Paraplegia, unspecified: Secondary | ICD-10-CM | POA: Diagnosis not present

## 2023-03-08 DIAGNOSIS — Z993 Dependence on wheelchair: Secondary | ICD-10-CM | POA: Diagnosis not present

## 2023-03-08 DIAGNOSIS — I700 Atherosclerosis of aorta: Secondary | ICD-10-CM | POA: Diagnosis not present

## 2023-03-08 DIAGNOSIS — M810 Age-related osteoporosis without current pathological fracture: Secondary | ICD-10-CM | POA: Diagnosis not present

## 2023-03-08 DIAGNOSIS — F0280 Dementia in other diseases classified elsewhere without behavioral disturbance: Secondary | ICD-10-CM | POA: Diagnosis not present

## 2023-03-08 DIAGNOSIS — E1151 Type 2 diabetes mellitus with diabetic peripheral angiopathy without gangrene: Secondary | ICD-10-CM | POA: Diagnosis not present

## 2023-03-08 DIAGNOSIS — L89152 Pressure ulcer of sacral region, stage 2: Secondary | ICD-10-CM | POA: Diagnosis not present

## 2023-03-08 DIAGNOSIS — E440 Moderate protein-calorie malnutrition: Secondary | ICD-10-CM | POA: Diagnosis not present

## 2023-03-08 DIAGNOSIS — I5032 Chronic diastolic (congestive) heart failure: Secondary | ICD-10-CM | POA: Diagnosis not present

## 2023-03-08 DIAGNOSIS — Z7982 Long term (current) use of aspirin: Secondary | ICD-10-CM | POA: Diagnosis not present

## 2023-03-08 DIAGNOSIS — L89891 Pressure ulcer of other site, stage 1: Secondary | ICD-10-CM | POA: Diagnosis not present

## 2023-03-08 DIAGNOSIS — Z7902 Long term (current) use of antithrombotics/antiplatelets: Secondary | ICD-10-CM | POA: Diagnosis not present

## 2023-03-08 DIAGNOSIS — Z87440 Personal history of urinary (tract) infections: Secondary | ICD-10-CM | POA: Diagnosis not present

## 2023-03-08 DIAGNOSIS — D649 Anemia, unspecified: Secondary | ICD-10-CM | POA: Diagnosis not present

## 2023-03-08 DIAGNOSIS — U071 COVID-19: Secondary | ICD-10-CM | POA: Diagnosis not present

## 2023-03-08 DIAGNOSIS — Z794 Long term (current) use of insulin: Secondary | ICD-10-CM | POA: Diagnosis not present

## 2023-03-08 DIAGNOSIS — E11649 Type 2 diabetes mellitus with hypoglycemia without coma: Secondary | ICD-10-CM | POA: Diagnosis not present

## 2023-03-08 DIAGNOSIS — E039 Hypothyroidism, unspecified: Secondary | ICD-10-CM | POA: Diagnosis not present

## 2023-03-11 DIAGNOSIS — Z7982 Long term (current) use of aspirin: Secondary | ICD-10-CM | POA: Diagnosis not present

## 2023-03-11 DIAGNOSIS — I700 Atherosclerosis of aorta: Secondary | ICD-10-CM | POA: Diagnosis not present

## 2023-03-11 DIAGNOSIS — E1122 Type 2 diabetes mellitus with diabetic chronic kidney disease: Secondary | ICD-10-CM | POA: Diagnosis not present

## 2023-03-11 DIAGNOSIS — L89152 Pressure ulcer of sacral region, stage 2: Secondary | ICD-10-CM | POA: Diagnosis not present

## 2023-03-11 DIAGNOSIS — Z87440 Personal history of urinary (tract) infections: Secondary | ICD-10-CM | POA: Diagnosis not present

## 2023-03-11 DIAGNOSIS — L89891 Pressure ulcer of other site, stage 1: Secondary | ICD-10-CM | POA: Diagnosis not present

## 2023-03-11 DIAGNOSIS — U071 COVID-19: Secondary | ICD-10-CM | POA: Diagnosis not present

## 2023-03-11 DIAGNOSIS — E11649 Type 2 diabetes mellitus with hypoglycemia without coma: Secondary | ICD-10-CM | POA: Diagnosis not present

## 2023-03-11 DIAGNOSIS — Z794 Long term (current) use of insulin: Secondary | ICD-10-CM | POA: Diagnosis not present

## 2023-03-11 DIAGNOSIS — Z993 Dependence on wheelchair: Secondary | ICD-10-CM | POA: Diagnosis not present

## 2023-03-11 DIAGNOSIS — F0280 Dementia in other diseases classified elsewhere without behavioral disturbance: Secondary | ICD-10-CM | POA: Diagnosis not present

## 2023-03-11 DIAGNOSIS — E039 Hypothyroidism, unspecified: Secondary | ICD-10-CM | POA: Diagnosis not present

## 2023-03-11 DIAGNOSIS — Z7902 Long term (current) use of antithrombotics/antiplatelets: Secondary | ICD-10-CM | POA: Diagnosis not present

## 2023-03-11 DIAGNOSIS — D649 Anemia, unspecified: Secondary | ICD-10-CM | POA: Diagnosis not present

## 2023-03-11 DIAGNOSIS — E1151 Type 2 diabetes mellitus with diabetic peripheral angiopathy without gangrene: Secondary | ICD-10-CM | POA: Diagnosis not present

## 2023-03-11 DIAGNOSIS — E440 Moderate protein-calorie malnutrition: Secondary | ICD-10-CM | POA: Diagnosis not present

## 2023-03-11 DIAGNOSIS — I5032 Chronic diastolic (congestive) heart failure: Secondary | ICD-10-CM | POA: Diagnosis not present

## 2023-03-11 DIAGNOSIS — G8220 Paraplegia, unspecified: Secondary | ICD-10-CM | POA: Diagnosis not present

## 2023-03-11 DIAGNOSIS — M810 Age-related osteoporosis without current pathological fracture: Secondary | ICD-10-CM | POA: Diagnosis not present

## 2023-03-11 DIAGNOSIS — N182 Chronic kidney disease, stage 2 (mild): Secondary | ICD-10-CM | POA: Diagnosis not present

## 2023-03-12 DIAGNOSIS — L89152 Pressure ulcer of sacral region, stage 2: Secondary | ICD-10-CM | POA: Diagnosis not present

## 2023-03-12 DIAGNOSIS — I700 Atherosclerosis of aorta: Secondary | ICD-10-CM | POA: Diagnosis not present

## 2023-03-12 DIAGNOSIS — E1122 Type 2 diabetes mellitus with diabetic chronic kidney disease: Secondary | ICD-10-CM | POA: Diagnosis not present

## 2023-03-12 DIAGNOSIS — I5032 Chronic diastolic (congestive) heart failure: Secondary | ICD-10-CM | POA: Diagnosis not present

## 2023-03-12 DIAGNOSIS — E11649 Type 2 diabetes mellitus with hypoglycemia without coma: Secondary | ICD-10-CM | POA: Diagnosis not present

## 2023-03-12 DIAGNOSIS — Z87440 Personal history of urinary (tract) infections: Secondary | ICD-10-CM | POA: Diagnosis not present

## 2023-03-12 DIAGNOSIS — G8220 Paraplegia, unspecified: Secondary | ICD-10-CM | POA: Diagnosis not present

## 2023-03-12 DIAGNOSIS — U071 COVID-19: Secondary | ICD-10-CM | POA: Diagnosis not present

## 2023-03-12 DIAGNOSIS — E1151 Type 2 diabetes mellitus with diabetic peripheral angiopathy without gangrene: Secondary | ICD-10-CM | POA: Diagnosis not present

## 2023-03-12 DIAGNOSIS — Z993 Dependence on wheelchair: Secondary | ICD-10-CM | POA: Diagnosis not present

## 2023-03-12 DIAGNOSIS — Z7982 Long term (current) use of aspirin: Secondary | ICD-10-CM | POA: Diagnosis not present

## 2023-03-12 DIAGNOSIS — M810 Age-related osteoporosis without current pathological fracture: Secondary | ICD-10-CM | POA: Diagnosis not present

## 2023-03-12 DIAGNOSIS — N182 Chronic kidney disease, stage 2 (mild): Secondary | ICD-10-CM | POA: Diagnosis not present

## 2023-03-12 DIAGNOSIS — L89891 Pressure ulcer of other site, stage 1: Secondary | ICD-10-CM | POA: Diagnosis not present

## 2023-03-12 DIAGNOSIS — D649 Anemia, unspecified: Secondary | ICD-10-CM | POA: Diagnosis not present

## 2023-03-12 DIAGNOSIS — E039 Hypothyroidism, unspecified: Secondary | ICD-10-CM | POA: Diagnosis not present

## 2023-03-12 DIAGNOSIS — E440 Moderate protein-calorie malnutrition: Secondary | ICD-10-CM | POA: Diagnosis not present

## 2023-03-12 DIAGNOSIS — Z7902 Long term (current) use of antithrombotics/antiplatelets: Secondary | ICD-10-CM | POA: Diagnosis not present

## 2023-03-12 DIAGNOSIS — Z794 Long term (current) use of insulin: Secondary | ICD-10-CM | POA: Diagnosis not present

## 2023-03-12 DIAGNOSIS — F0280 Dementia in other diseases classified elsewhere without behavioral disturbance: Secondary | ICD-10-CM | POA: Diagnosis not present

## 2023-03-15 ENCOUNTER — Other Ambulatory Visit: Payer: Self-pay | Admitting: *Deleted

## 2023-03-15 ENCOUNTER — Ambulatory Visit: Payer: Medicare HMO

## 2023-03-15 DIAGNOSIS — I739 Peripheral vascular disease, unspecified: Secondary | ICD-10-CM

## 2023-03-16 ENCOUNTER — Inpatient Hospital Stay (HOSPITAL_COMMUNITY)
Admission: EM | Admit: 2023-03-16 | Discharge: 2023-03-20 | DRG: 194 | Disposition: A | Discharge status: Home Health | Payer: Medicare HMO | Attending: Internal Medicine | Admitting: Internal Medicine

## 2023-03-16 ENCOUNTER — Emergency Department (HOSPITAL_COMMUNITY): Payer: Medicare HMO

## 2023-03-16 ENCOUNTER — Ambulatory Visit (HOSPITAL_BASED_OUTPATIENT_CLINIC_OR_DEPARTMENT_OTHER): Payer: Medicare HMO | Admitting: Internal Medicine

## 2023-03-16 ENCOUNTER — Encounter (HOSPITAL_COMMUNITY): Payer: Self-pay

## 2023-03-16 ENCOUNTER — Other Ambulatory Visit: Payer: Self-pay

## 2023-03-16 DIAGNOSIS — G8220 Paraplegia, unspecified: Secondary | ICD-10-CM | POA: Diagnosis not present

## 2023-03-16 DIAGNOSIS — R509 Fever, unspecified: Secondary | ICD-10-CM | POA: Diagnosis not present

## 2023-03-16 DIAGNOSIS — R569 Unspecified convulsions: Secondary | ICD-10-CM | POA: Diagnosis present

## 2023-03-16 DIAGNOSIS — E861 Hypovolemia: Secondary | ICD-10-CM | POA: Diagnosis not present

## 2023-03-16 DIAGNOSIS — L89152 Pressure ulcer of sacral region, stage 2: Secondary | ICD-10-CM | POA: Diagnosis not present

## 2023-03-16 DIAGNOSIS — E039 Hypothyroidism, unspecified: Secondary | ICD-10-CM | POA: Diagnosis present

## 2023-03-16 DIAGNOSIS — R410 Disorientation, unspecified: Secondary | ICD-10-CM | POA: Diagnosis not present

## 2023-03-16 DIAGNOSIS — J189 Pneumonia, unspecified organism: Principal | ICD-10-CM | POA: Diagnosis present

## 2023-03-16 DIAGNOSIS — B370 Candidal stomatitis: Secondary | ICD-10-CM | POA: Diagnosis present

## 2023-03-16 DIAGNOSIS — I959 Hypotension, unspecified: Secondary | ICD-10-CM | POA: Diagnosis not present

## 2023-03-16 DIAGNOSIS — Z8701 Personal history of pneumonia (recurrent): Secondary | ICD-10-CM

## 2023-03-16 DIAGNOSIS — I9589 Other hypotension: Secondary | ICD-10-CM | POA: Diagnosis present

## 2023-03-16 DIAGNOSIS — F0280 Dementia in other diseases classified elsewhere without behavioral disturbance: Secondary | ICD-10-CM | POA: Diagnosis not present

## 2023-03-16 DIAGNOSIS — E1122 Type 2 diabetes mellitus with diabetic chronic kidney disease: Secondary | ICD-10-CM | POA: Diagnosis present

## 2023-03-16 DIAGNOSIS — Z888 Allergy status to other drugs, medicaments and biological substances status: Secondary | ICD-10-CM

## 2023-03-16 DIAGNOSIS — M3390 Dermatopolymyositis, unspecified, organ involvement unspecified: Secondary | ICD-10-CM | POA: Diagnosis not present

## 2023-03-16 DIAGNOSIS — D649 Anemia, unspecified: Secondary | ICD-10-CM | POA: Diagnosis not present

## 2023-03-16 DIAGNOSIS — E1165 Type 2 diabetes mellitus with hyperglycemia: Secondary | ICD-10-CM | POA: Diagnosis not present

## 2023-03-16 DIAGNOSIS — Z87898 Personal history of other specified conditions: Secondary | ICD-10-CM

## 2023-03-16 DIAGNOSIS — L8991 Pressure ulcer of unspecified site, stage 1: Secondary | ICD-10-CM | POA: Diagnosis not present

## 2023-03-16 DIAGNOSIS — E1151 Type 2 diabetes mellitus with diabetic peripheral angiopathy without gangrene: Secondary | ICD-10-CM | POA: Diagnosis not present

## 2023-03-16 DIAGNOSIS — N3281 Overactive bladder: Secondary | ICD-10-CM | POA: Diagnosis present

## 2023-03-16 DIAGNOSIS — R5383 Other fatigue: Secondary | ICD-10-CM | POA: Diagnosis not present

## 2023-03-16 DIAGNOSIS — M810 Age-related osteoporosis without current pathological fracture: Secondary | ICD-10-CM | POA: Diagnosis not present

## 2023-03-16 DIAGNOSIS — U071 COVID-19: Secondary | ICD-10-CM | POA: Diagnosis not present

## 2023-03-16 DIAGNOSIS — Z794 Long term (current) use of insulin: Secondary | ICD-10-CM

## 2023-03-16 DIAGNOSIS — M3313 Other dermatomyositis without myopathy: Secondary | ICD-10-CM | POA: Diagnosis present

## 2023-03-16 DIAGNOSIS — Z7902 Long term (current) use of antithrombotics/antiplatelets: Secondary | ICD-10-CM | POA: Diagnosis not present

## 2023-03-16 DIAGNOSIS — I10 Essential (primary) hypertension: Secondary | ICD-10-CM | POA: Diagnosis present

## 2023-03-16 DIAGNOSIS — Z66 Do not resuscitate: Secondary | ICD-10-CM | POA: Diagnosis not present

## 2023-03-16 DIAGNOSIS — Z7982 Long term (current) use of aspirin: Secondary | ICD-10-CM

## 2023-03-16 DIAGNOSIS — E440 Moderate protein-calorie malnutrition: Secondary | ICD-10-CM | POA: Diagnosis not present

## 2023-03-16 DIAGNOSIS — Z87440 Personal history of urinary (tract) infections: Secondary | ICD-10-CM | POA: Diagnosis not present

## 2023-03-16 DIAGNOSIS — N182 Chronic kidney disease, stage 2 (mild): Secondary | ICD-10-CM | POA: Diagnosis not present

## 2023-03-16 DIAGNOSIS — Y95 Nosocomial condition: Secondary | ICD-10-CM | POA: Diagnosis not present

## 2023-03-16 DIAGNOSIS — Z79631 Long term (current) use of antimetabolite agent: Secondary | ICD-10-CM

## 2023-03-16 DIAGNOSIS — Z993 Dependence on wheelchair: Secondary | ICD-10-CM | POA: Diagnosis not present

## 2023-03-16 DIAGNOSIS — K219 Gastro-esophageal reflux disease without esophagitis: Secondary | ICD-10-CM | POA: Diagnosis present

## 2023-03-16 DIAGNOSIS — R918 Other nonspecific abnormal finding of lung field: Secondary | ICD-10-CM | POA: Diagnosis not present

## 2023-03-16 DIAGNOSIS — Z886 Allergy status to analgesic agent status: Secondary | ICD-10-CM

## 2023-03-16 DIAGNOSIS — Z7952 Long term (current) use of systemic steroids: Secondary | ICD-10-CM

## 2023-03-16 DIAGNOSIS — E1159 Type 2 diabetes mellitus with other circulatory complications: Secondary | ICD-10-CM | POA: Diagnosis present

## 2023-03-16 DIAGNOSIS — I5032 Chronic diastolic (congestive) heart failure: Secondary | ICD-10-CM | POA: Diagnosis present

## 2023-03-16 DIAGNOSIS — F0390 Unspecified dementia without behavioral disturbance: Secondary | ICD-10-CM | POA: Diagnosis not present

## 2023-03-16 DIAGNOSIS — Z743 Need for continuous supervision: Secondary | ICD-10-CM | POA: Diagnosis not present

## 2023-03-16 DIAGNOSIS — E11649 Type 2 diabetes mellitus with hypoglycemia without coma: Secondary | ICD-10-CM | POA: Diagnosis not present

## 2023-03-16 DIAGNOSIS — Z87891 Personal history of nicotine dependence: Secondary | ICD-10-CM

## 2023-03-16 DIAGNOSIS — Z833 Family history of diabetes mellitus: Secondary | ICD-10-CM

## 2023-03-16 DIAGNOSIS — E871 Hypo-osmolality and hyponatremia: Secondary | ICD-10-CM | POA: Diagnosis not present

## 2023-03-16 DIAGNOSIS — Z8616 Personal history of COVID-19: Secondary | ICD-10-CM | POA: Diagnosis not present

## 2023-03-16 DIAGNOSIS — E7800 Pure hypercholesterolemia, unspecified: Secondary | ICD-10-CM | POA: Diagnosis present

## 2023-03-16 DIAGNOSIS — R54 Age-related physical debility: Secondary | ICD-10-CM | POA: Diagnosis not present

## 2023-03-16 DIAGNOSIS — Z825 Family history of asthma and other chronic lower respiratory diseases: Secondary | ICD-10-CM

## 2023-03-16 DIAGNOSIS — Z7401 Bed confinement status: Secondary | ICD-10-CM

## 2023-03-16 DIAGNOSIS — Z881 Allergy status to other antibiotic agents status: Secondary | ICD-10-CM

## 2023-03-16 DIAGNOSIS — I130 Hypertensive heart and chronic kidney disease with heart failure and stage 1 through stage 4 chronic kidney disease, or unspecified chronic kidney disease: Secondary | ICD-10-CM | POA: Diagnosis present

## 2023-03-16 DIAGNOSIS — J1282 Pneumonia due to coronavirus disease 2019: Secondary | ICD-10-CM | POA: Diagnosis not present

## 2023-03-16 DIAGNOSIS — Z8249 Family history of ischemic heart disease and other diseases of the circulatory system: Secondary | ICD-10-CM

## 2023-03-16 DIAGNOSIS — Z79899 Other long term (current) drug therapy: Secondary | ICD-10-CM

## 2023-03-16 DIAGNOSIS — Z90710 Acquired absence of both cervix and uterus: Secondary | ICD-10-CM

## 2023-03-16 DIAGNOSIS — Z882 Allergy status to sulfonamides status: Secondary | ICD-10-CM

## 2023-03-16 DIAGNOSIS — I700 Atherosclerosis of aorta: Secondary | ICD-10-CM | POA: Diagnosis not present

## 2023-03-16 DIAGNOSIS — L89891 Pressure ulcer of other site, stage 1: Secondary | ICD-10-CM | POA: Diagnosis not present

## 2023-03-16 DIAGNOSIS — Z79890 Hormone replacement therapy (postmenopausal): Secondary | ICD-10-CM

## 2023-03-16 DIAGNOSIS — Z800 Family history of malignant neoplasm of digestive organs: Secondary | ICD-10-CM

## 2023-03-16 DIAGNOSIS — I6782 Cerebral ischemia: Secondary | ICD-10-CM | POA: Diagnosis not present

## 2023-03-16 DIAGNOSIS — R4182 Altered mental status, unspecified: Secondary | ICD-10-CM | POA: Diagnosis not present

## 2023-03-16 HISTORY — DX: Unspecified dementia, unspecified severity, without behavioral disturbance, psychotic disturbance, mood disturbance, and anxiety: F03.90

## 2023-03-16 LAB — URINALYSIS, W/ REFLEX TO CULTURE (INFECTION SUSPECTED)
Bacteria, UA: NONE SEEN
Bilirubin Urine: NEGATIVE
Glucose, UA: 50 mg/dL — AB
Hgb urine dipstick: NEGATIVE
Ketones, ur: NEGATIVE mg/dL
Leukocytes,Ua: NEGATIVE
Nitrite: NEGATIVE
Protein, ur: NEGATIVE mg/dL
Specific Gravity, Urine: 1.008 (ref 1.005–1.030)
pH: 7 (ref 5.0–8.0)

## 2023-03-16 LAB — CBC WITH DIFFERENTIAL/PLATELET
Abs Immature Granulocytes: 0.09 10*3/uL — ABNORMAL HIGH (ref 0.00–0.07)
Basophils Absolute: 0 10*3/uL (ref 0.0–0.1)
Basophils Relative: 0 %
Eosinophils Absolute: 0.1 10*3/uL (ref 0.0–0.5)
Eosinophils Relative: 0 %
HCT: 27.5 % — ABNORMAL LOW (ref 36.0–46.0)
Hemoglobin: 9 g/dL — ABNORMAL LOW (ref 12.0–15.0)
Immature Granulocytes: 1 %
Lymphocytes Relative: 7 %
Lymphs Abs: 1.1 10*3/uL (ref 0.7–4.0)
MCH: 33 pg (ref 26.0–34.0)
MCHC: 32.7 g/dL (ref 30.0–36.0)
MCV: 100.7 fL — ABNORMAL HIGH (ref 80.0–100.0)
Monocytes Absolute: 0.7 10*3/uL (ref 0.1–1.0)
Monocytes Relative: 4 %
Neutro Abs: 14.6 10*3/uL — ABNORMAL HIGH (ref 1.7–7.7)
Neutrophils Relative %: 88 %
Platelets: 251 10*3/uL (ref 150–400)
RBC: 2.73 MIL/uL — ABNORMAL LOW (ref 3.87–5.11)
RDW: 15.4 % (ref 11.5–15.5)
WBC: 16.5 10*3/uL — ABNORMAL HIGH (ref 4.0–10.5)
nRBC: 0 % (ref 0.0–0.2)

## 2023-03-16 LAB — COMPREHENSIVE METABOLIC PANEL
ALT: 45 U/L — ABNORMAL HIGH (ref 0–44)
AST: 44 U/L — ABNORMAL HIGH (ref 15–41)
Albumin: 2.9 g/dL — ABNORMAL LOW (ref 3.5–5.0)
Alkaline Phosphatase: 78 U/L (ref 38–126)
Anion gap: 7 (ref 5–15)
BUN: 15 mg/dL (ref 8–23)
CO2: 29 mmol/L (ref 22–32)
Calcium: 9.4 mg/dL (ref 8.9–10.3)
Chloride: 97 mmol/L — ABNORMAL LOW (ref 98–111)
Creatinine, Ser: 0.83 mg/dL (ref 0.44–1.00)
GFR, Estimated: >60 mL/min (ref >60)
Glucose, Bld: 133 mg/dL — ABNORMAL HIGH (ref 70–99)
Potassium: 3.8 mmol/L (ref 3.5–5.1)
Sodium: 133 mmol/L — ABNORMAL LOW (ref 135–145)
Total Bilirubin: 0.4 mg/dL (ref 0.3–1.2)
Total Protein: 6.2 g/dL — ABNORMAL LOW (ref 6.5–8.1)

## 2023-03-16 LAB — GLUCOSE, CAPILLARY
Glucose-Capillary: 60 mg/dL — ABNORMAL LOW (ref 70–99)
Glucose-Capillary: 103 mg/dL — ABNORMAL HIGH (ref 70–99)

## 2023-03-16 LAB — I-STAT CG4 LACTIC ACID, ED: Lactic Acid, Venous: 1.4 mmol/L (ref 0.5–1.9)

## 2023-03-16 MED ORDER — LEVOTHYROXINE SODIUM 100 MCG PO TABS
100.0000 ug | ORAL_TABLET | Freq: Every day | ORAL | Status: DC
  Administered 2023-03-17 – 2023-03-20 (×4): 100 ug via ORAL
  Filled 2023-03-16 (×4): qty 1

## 2023-03-16 MED ORDER — CLOPIDOGREL BISULFATE 75 MG PO TABS
75.0000 mg | ORAL_TABLET | Freq: Every day | ORAL | Status: DC
  Administered 2023-03-17 – 2023-03-20 (×4): 75 mg via ORAL
  Filled 2023-03-16 (×4): qty 1

## 2023-03-16 MED ORDER — ASPIRIN 81 MG PO TBEC
81.0000 mg | DELAYED_RELEASE_TABLET | Freq: Every day | ORAL | Status: DC
  Administered 2023-03-17 – 2023-03-20 (×4): 81 mg via ORAL
  Filled 2023-03-16 (×4): qty 1

## 2023-03-16 MED ORDER — ONDANSETRON HCL 4 MG/2ML IJ SOLN: 4.0000 mg | Freq: Four times a day (QID) | INTRAMUSCULAR | Status: DC | PRN

## 2023-03-16 MED ORDER — SODIUM CHLORIDE 0.9 % IV SOLN
1.0000 g | INTRAVENOUS | Status: DC
Start: 2023-03-16 — End: 2023-03-16

## 2023-03-16 MED ORDER — INSULIN ASPART 100 UNIT/ML IJ SOLN
0.0000 [IU] | Freq: Three times a day (TID) | INTRAMUSCULAR | Status: DC
  Administered 2023-03-17 (×2): 8 [IU] via SUBCUTANEOUS
  Administered 2023-03-17: 5 [IU] via SUBCUTANEOUS
  Filled 2023-03-16: qty 0.15

## 2023-03-16 MED ORDER — SODIUM CHLORIDE 0.9 % IV SOLN: 100.0000 mg | Freq: Two times a day (BID) | INTRAVENOUS | Status: DC

## 2023-03-16 MED ORDER — ONDANSETRON HCL 4 MG PO TABS: 4.0000 mg | ORAL_TABLET | Freq: Four times a day (QID) | ORAL | Status: DC | PRN

## 2023-03-16 MED ORDER — EZETIMIBE 10 MG PO TABS
10.0000 mg | ORAL_TABLET | Freq: Every day | ORAL | Status: DC
  Administered 2023-03-16 – 2023-03-20 (×5): 10 mg via ORAL
  Filled 2023-03-16 (×5): qty 1

## 2023-03-16 MED ORDER — INSULIN ASPART 100 UNIT/ML IJ SOLN
0.0000 [IU] | Freq: Every day | INTRAMUSCULAR | Status: DC
  Administered 2023-03-17: 3 [IU] via SUBCUTANEOUS
  Filled 2023-03-16: qty 0.05

## 2023-03-16 MED ORDER — ACETAMINOPHEN 325 MG PO TABS
650.0000 mg | ORAL_TABLET | Freq: Four times a day (QID) | ORAL | Status: DC | PRN
  Administered 2023-03-18: 650 mg via ORAL
  Filled 2023-03-16: qty 2

## 2023-03-16 MED ORDER — MIDODRINE HCL 5 MG PO TABS
2.5000 mg | ORAL_TABLET | Freq: Three times a day (TID) | ORAL | Status: DC
  Administered 2023-03-17 – 2023-03-20 (×11): 2.5 mg via ORAL
  Filled 2023-03-16 (×11): qty 1

## 2023-03-16 MED ORDER — LEVETIRACETAM 500 MG PO TABS
500.0000 mg | ORAL_TABLET | Freq: Two times a day (BID) | ORAL | Status: DC
  Administered 2023-03-16 – 2023-03-20 (×8): 500 mg via ORAL
  Filled 2023-03-16 (×8): qty 1

## 2023-03-16 MED ORDER — ACETAMINOPHEN 650 MG RE SUPP: 650.0000 mg | Freq: Four times a day (QID) | RECTAL | Status: DC | PRN

## 2023-03-16 MED ORDER — ALBUTEROL SULFATE (2.5 MG/3ML) 0.083% IN NEBU: 2.5000 mg | INHALATION_SOLUTION | RESPIRATORY_TRACT | Status: DC | PRN

## 2023-03-16 MED ORDER — PIPERACILLIN-TAZOBACTAM 3.375 G IVPB
3.3750 g | Freq: Three times a day (TID) | INTRAVENOUS | Status: DC
  Administered 2023-03-16 – 2023-03-20 (×11): 3.375 g via INTRAVENOUS
  Filled 2023-03-16 (×12): qty 50

## 2023-03-16 MED ORDER — LORATADINE 10 MG PO TABS
10.0000 mg | ORAL_TABLET | Freq: Every day | ORAL | Status: DC
  Administered 2023-03-17 – 2023-03-20 (×4): 10 mg via ORAL
  Filled 2023-03-16 (×4): qty 1

## 2023-03-16 MED ORDER — HYDROXYCHLOROQUINE SULFATE 200 MG PO TABS
200.0000 mg | ORAL_TABLET | Freq: Every day | ORAL | Status: DC
  Administered 2023-03-16 – 2023-03-19 (×4): 200 mg via ORAL
  Filled 2023-03-16 (×5): qty 1

## 2023-03-16 MED ORDER — TRAZODONE HCL 50 MG PO TABS: 25.0000 mg | ORAL_TABLET | Freq: Every evening | ORAL | Status: DC | PRN

## 2023-03-16 MED ORDER — ENOXAPARIN SODIUM 40 MG/0.4ML IJ SOSY
40.0000 mg | PREFILLED_SYRINGE | INTRAMUSCULAR | Status: DC
  Administered 2023-03-16 – 2023-03-19 (×4): 40 mg via SUBCUTANEOUS
  Filled 2023-03-16 (×4): qty 0.4

## 2023-03-16 NOTE — Progress Notes (Signed)
PHARMACY NOTE -  Zosyn  Pharmacy has been assisting with dosing of Zosyn for CAP. Dosage remains stable at 3.375 g IV q8 hr and further renal adjustments per institutional Pharmacy antibiotic protocol  Pharmacy will sign off, following peripherally for culture results, dose adjustments, and length of therapy. Please reconsult if a change in clinical status warrants re-evaluation of dosage.  Bernadene Person, PharmD, BCPS [phone] 03/16/2023, 9:06 PM

## 2023-03-16 NOTE — ED Notes (Signed)
ED TO INPATIENT HANDOFF REPORT  Name/Age/Gender [name] [name] 89 y.o. female  Code Status    Code Status Orders  (From admission, onward)           Start     Ordered   03/16/23 2047  Do not attempt resuscitation (DNR)  Continuous       Question Answer Comment  If patient has no pulse and is not breathing Do Not Attempt Resuscitation   If patient has a pulse and/or is breathing: Medical Treatment Goals LIMITED ADDITIONAL INTERVENTIONS: Use medication/IV fluids and cardiac monitoring as indicated; Do not use intubation or mechanical ventilation (DNI), also provide comfort medications.  Transfer to Progressive/Stepdown as indicated, avoid Intensive Care.   Consent: Discussion documented in EHR or advanced directives reviewed      03/16/23 2048           Code Status History     Date Active Date Inactive Code Status Order ID Comments User Context   02/25/2023 0006 03/01/2023 1734 DNR [id number]  Benita Gutter T, DO ED   02/15/2023 0120 02/17/2023 2007 DNR [id number]  Darlin Drop, DO ED   02/08/2023 1730 02/09/2023 1537 Full Code [id number]  Chuck Hint, MD Inpatient   10/02/2022 1929 10/05/2022 1859 DNR [id number]  Orland Mustard, MD ED   09/10/2022 1409 09/11/2022 2127 DNR [id number]  Emeline General, MD ED   07/27/2022 2317 07/30/2022 2004 DNR [id number]  Charlsie Quest, MD ED   05/09/2022 2153 05/13/2022 2041 Full Code [id number]  Darlin Drop, DO ED   01/13/2022 1556 01/20/2022 1935 DNR [id number]  Teddy Spike, DO Inpatient   09/13/2021 1921 09/16/2021 1758 DNR [id number]  Rolly Salter, MD ED   09/13/2021 1901 09/13/2021 1921 Full Code [id number]  Rolly Salter, MD ED   10/08/2019 1040 10/12/2019 1639 Full Code [id number]  Arline Asp, NP ED   08/21/2019 1118 08/23/2019 2332 Full Code [id number]  Clydie Braun, MD ED   08/18/2019 0602 08/20/2019 1748 Full Code [id number]  John Giovanni, MD Inpatient   11/26/2016 0048 12/09/2016 1943 Full Code [id number]  Alberteen Sam,  MD Inpatient   06/10/2016 1355 06/11/2016 1537 Full Code [id number]  [facility]       Home/SNF/Other Home  Chief Complaint CAP (community acquired pneumonia) [J18.9]  Level of Care/Admitting Diagnosis ED Disposition     ED Disposition  Admit   Condition  --   Comment  Hospital Area: [facility] [[id number]]  Level of Care: Med-Surg [16]  May admit patient to [facility] or [facility] if equivalent level of care is available:: Yes  Covid Evaluation: Asymptomatic - no recent exposure (last 10 days) testing not required  Diagnosis: CAP (community acquired pneumonia) [627035]  Admitting Physician: Maryln Gottron [[id number]]  Attending Physician: Kirby Crigler, MIR [email or web address] [[id number]]  Certification:: I certify this patient will need inpatient services for at least 2 midnights  Expected Medical Readiness: 03/19/2023          Medical History Past Medical History:  Diagnosis Date   Arthritis    "knees, legs" (06/10/2016)   Ascites    Chronic kidney disease    "related to my diabetes"   Chronic lower back pain    Dementia without behavioral disturbance (HCC)    Diabetes mellitus without complication (HCC)    Eczema    GERD (gastroesophageal reflux disease)    High cholesterol    Hyperlipidemia    Hypertension  Hypothyroidism    OAB (overactive bladder)    Osteoporosis    Thyroid disease    Type II diabetes mellitus (HCC)    Urticaria     Allergies Allergies  Allergen Reactions   Nsaids Other (See Comments)    No NSAIDS due to kidney function- ESPECIALLY ibuprofen or Aleve   Biaxin [Clarithromycin] Hives   Statins Other (See Comments)    Muscle pain (myositis per daugher)   Bactrim [Sulfamethoxazole-Trimethoprim] Hives and Rash   Lisinopril Cough   Sulfa Antibiotics Hives, Rash and Other (See Comments)    NO sulfa-based meds!!     IV Location/Drains/Wounds Patient Lines/Drains/Airways Status     Active Line/Drains/Airways      Name Placement date Placement time Site Days   Peripheral IV 03/16/23 18 G Left Forearm 03/16/23  1738  Forearm  less than 1   Pressure Injury 10/02/22 Coccyx Medial Stage 2 -  Partial thickness loss of dermis presenting as a shallow open injury with a red, pink wound bed without slough. 10/02/22  2055  -- 165   Pressure Injury 10/02/22 Heel Left;Right Stage 1 -  Intact skin with non-blanchable redness of a localized area usually over a bony prominence. Bilateral heel redness, prophylactic drsg applied, heels floating 10/02/22  2055  -- 165   Pressure Injury 10/02/22 Knee Anterior;Left Stage 2 -  Partial thickness loss of dermis presenting as a shallow open injury with a red, pink wound bed without slough. 2 open areas above left knee 10/02/22  2055  -- 165   Pressure Injury 10/02/22 Toe (Comment  which one) Anterior;Right Stage 1 -  Intact skin with non-blanchable redness of a localized area usually over a bony prominence. right bunion redness 10/02/22  2055  -- 165   Pressure Injury 10/02/22 Back Left;Upper Stage 1 -  Intact skin with non-blanchable redness of a localized area usually over a bony prominence. erythema to bony prominence of left shoulder blade 10/02/22  2055  -- 165   Wound / Incision (Open or Dehisced) 09/10/22 Toe (Comment  which one) Anterior;Right sore on tip of big toe 09/10/22  1725  Toe (Comment  which one)  187   Wound / Incision (Open or Dehisced) 09/10/22 Toe (Comment  which one) Anterior;Left Wound on tip of big toe 09/10/22  1725  Toe (Comment  which one)  187   Wound / Incision (Open or Dehisced) 09/10/22 Foot Anterior;Left "bunion" per family, with open area 09/10/22  1725  Foot  187   Wound / Incision (Open or Dehisced) 09/10/22 Coccyx 09/10/22  1725  Coccyx  187            Labs/Imaging Results for orders placed or performed during the hospital encounter of 03/16/23 (from the past 48 hour(s))  Comprehensive metabolic panel     Status: Abnormal   Collection Time:  03/16/23  5:43 PM  Result Value Ref Range   Sodium 133 (L) 135 - 145 mmol/L   Potassium 3.8 3.5 - 5.1 mmol/L   Chloride 97 (L) 98 - 111 mmol/L   CO2 29 22 - 32 mmol/L   Glucose, Bld 133 (H) 70 - 99 mg/dL    Comment: Glucose reference range applies only to samples taken after fasting for at least 8 hours.   BUN 15 8 - 23 mg/dL   Creatinine, Ser 8.41 0.44 - 1.00 mg/dL   Calcium 9.4 8.9 - 32.4 mg/dL   Total Protein 6.2 (L) 6.5 - 8.1 g/dL   Albumin  2.9 (L) 3.5 - 5.0 g/dL   AST 44 (H) 15 - 41 U/L   ALT 45 (H) 0 - 44 U/L   Alkaline Phosphatase 78 38 - 126 U/L   Total Bilirubin 0.4 0.3 - 1.2 mg/dL   GFR, Estimated >16 >10 mL/min    Comment: (NOTE) Calculated using the CKD-EPI Creatinine Equation (2021)    Anion gap 7 5 - 15    Comment: Performed at [facility], [address], [place], [place] [address]  CBC with Differential     Status: Abnormal   Collection Time: 03/16/23  5:43 PM  Result Value Ref Range   WBC 16.5 (H) 4.0 - 10.5 K/uL   RBC 2.73 (L) 3.87 - 5.11 MIL/uL   Hemoglobin 9.0 (L) 12.0 - 15.0 g/dL   HCT 40.9 (L) 81.1 - 91.4 %   MCV 100.7 (H) 80.0 - 100.0 fL   MCH 33.0 26.0 - 34.0 pg   MCHC 32.7 30.0 - 36.0 g/dL   RDW 78.2 95.6 - 21.3 %   Platelets 251 150 - 400 K/uL   nRBC 0.0 0.0 - 0.2 %   Neutrophils Relative % 88 %   Neutro Abs 14.6 (H) 1.7 - 7.7 K/uL   Lymphocytes Relative 7 %   Lymphs Abs 1.1 0.7 - 4.0 K/uL   Monocytes Relative 4 %   Monocytes Absolute 0.7 0.1 - 1.0 K/uL   Eosinophils Relative 0 %   Eosinophils Absolute 0.1 0.0 - 0.5 K/uL   Basophils Relative 0 %   Basophils Absolute 0.0 0.0 - 0.1 K/uL   Immature Granulocytes 1 %   Abs Immature Granulocytes 0.09 (H) 0.00 - 0.07 K/uL    Comment: Performed at [facility], [address], [place], [place] [address]  I-Stat Lactic Acid, ED     Status: None   Collection Time: 03/16/23  5:53 PM  Result Value Ref Range   Lactic Acid, Venous 1.4 0.5 - 1.9 mmol/L  Urinalysis,  w/ Reflex to Culture (Infection Suspected) -Urine, Clean Catch     Status: Abnormal   Collection Time: 03/16/23  8:36 PM  Result Value Ref Range   Specimen Source URINE, CLEAN CATCH    Color, Urine YELLOW YELLOW   APPearance CLEAR CLEAR   Specific Gravity, Urine 1.008 1.005 - 1.030   pH 7.0 5.0 - 8.0   Glucose, UA 50 (A) NEGATIVE mg/dL   Hgb urine dipstick NEGATIVE NEGATIVE   Bilirubin Urine NEGATIVE NEGATIVE   Ketones, ur NEGATIVE NEGATIVE mg/dL   Protein, ur NEGATIVE NEGATIVE mg/dL   Nitrite NEGATIVE NEGATIVE   Leukocytes,Ua NEGATIVE NEGATIVE   RBC / HPF 0-5 0 - 5 RBC/hpf   WBC, UA 0-5 0 - 5 WBC/hpf    Comment:        Reflex urine culture not performed if WBC <=10, OR if Squamous epithelial cells >5. If Squamous epithelial cells >5 suggest recollection.    Bacteria, UA NONE SEEN NONE SEEN   Squamous Epithelial / HPF 0-5 0 - 5 /HPF    Comment: Performed at [facility], [address] [place], [place] [address]   CT Head Wo Contrast  Result Date: 03/16/2023 CLINICAL DATA:  Altered mental status. Weakness and confusion. Fever. 02/24/2023 EXAM: CT HEAD WITHOUT CONTRAST TECHNIQUE: Contiguous axial images were obtained from the base of the skull through the vertex without intravenous contrast. RADIATION DOSE REDUCTION: This exam was performed according to the departmental dose-optimization program which includes automated exposure  control, adjustment of the mA and/or kV according to patient size and/or use of iterative reconstruction technique. COMPARISON:  02/24/2023 CT.  09/10/2022 MRI. FINDINGS: Brain: No abnormality is seen affecting the brainstem. 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 Cerebral hemispheres show or probably represents old small vessel infarctions of the thalami, based on the prior imaging. Chronic small-vessel ischemic changes affect the cerebral hemispheric white matter. No cortical or large vessel territory stroke. The ventricles are  prominent, but this is chronic and likely due to central atrophy. No mass, hemorrhage or extra-axial collection. Vascular: There is atherosclerotic calcification of the major vessels at the base of the brain. Skull: Negative Sinuses/Orbits: Clear/normal Other: None IMPRESSION: 1. No acute CT finding. Chronic small-vessel ischemic changes of the cerebral hemispheric white matter. 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 2. The ventricles are prominent, but this is chronic and likely due to central atrophy. Electronically Signed   By: Paulina Fusi M.D.   On: 03/16/2023 19:58   DG Chest 2 View  Result Date: 03/16/2023 CLINICAL DATA:  Fever EXAM: CHEST - 2 VIEW COMPARISON:  X-ray 02/24/2023 FINDINGS: Hyperinflation. No pneumothorax or effusion. Normal cardiopericardial silhouette. Calcified aorta. Loop recorder overlies the lower left hemithorax. There is some asymmetric mild opacity in the right midlung. A subtle infiltrate is possible. Recommend follow-up. This could be superior segment right lower lobe on the lateral view. Kyphotic x-ray obscures the apices. Degenerative changes. Osteopenia. IMPRESSION: Hyperinflation. Subtle asymmetric opacity in the right midthorax, possibly superior segment right lower lobe. An infiltrate is possible. Recommend follow-up. Electronically Signed   By: Karen Kays M.D.   On: 03/16/2023 18:46    Pending Labs Unresulted Labs (From admission, onward)     Start     Ordered   03/17/23 0500  Basic metabolic panel  Tomorrow morning,   R        03/16/23 2048   03/17/23 0500  CBC  Tomorrow morning,   R        03/16/23 2048   03/16/23 1753  Blood culture (routine x 2)  BLOOD CULTURE X 2,   R (with STAT occurrences)      03/16/23 1752            Vitals/Pain Today's Vitals   03/16/23 1900 03/16/23 1941 03/16/23 2000 03/16/23 2032  BP: (!) 135/58   (!) 137/49  Pulse: 78  (!) 58 61  Resp: (!) 21  15 13   Temp:  98 F (36.7 C)  98.4 F  (36.9 C)  TempSrc:  Oral  Oral  SpO2: 98%  100% 100%  PainSc:        Isolation Precautions No active isolations  Medications Medications  hydroxychloroquine (PLAQUENIL) tablet 200 mg (has no administration in time range)  aspirin EC tablet 81 mg (has no administration in time range)  ezetimibe (ZETIA) tablet 10 mg (has no administration in time range)  midodrine (PROAMATINE) tablet 2.5 mg (has no administration in time range)  levothyroxine (SYNTHROID) tablet 100 mcg (has no administration in time range)  clopidogrel (PLAVIX) tablet 75 mg (has no administration in time range)  levETIRAcetam (KEPPRA) tablet 500 mg (has no administration in time range)  loratadine (CLARITIN) tablet 10 mg (has no administration in time range)  enoxaparin (LOVENOX) injection 40 mg (has no administration in time range)  insulin aspart (novoLOG) injection 0-15 Units (has no administration in time range)  insulin aspart (novoLOG) injection 0-5 Units (has no administration in time range)  acetaminophen (TYLENOL) tablet 650 mg (has no administration in time range)    Or  acetaminophen (TYLENOL) suppository 650 mg (has no administration in time range)  traZODone (DESYREL) tablet 25 mg (has no administration in time range)  ondansetron (ZOFRAN) tablet 4 mg (has no administration in time range)    Or  ondansetron (ZOFRAN) injection 4 mg (has no administration in time range)  albuterol (PROVENTIL) (2.5 MG/3ML) 0.083% nebulizer solution 2.5 mg (has no administration in time range)    Mobility non-ambulatory

## 2023-03-16 NOTE — H&P (Signed)
History and Physical  [name] [name] ZOX:[id number] DOB: [birth year] DOA: 03/16/2023  PCP: [name], MD   Chief Complaint: weakness, fever   HPI: [name] [name] is a 89 y.o. female with medical history significant for manage on steroids and Plaquenil for dermatomyositis, chronic diastolic congestive heart failure, insulin-dependent type 2 diabetes, CKD stage II, who had recent hospital stays for aspiration pneumonia as well as COVID weakness most recently discharged 8/12 who is being admitted to the hospital again tonight with complaints of weakness and concern for healthcare acquired pneumonia.  History is provided by the patient's daughter, who along with another one of the patient's daughters is the patient's primary caregiver.  She is taking care of her around-the-clock by her daughters.  They state that she had been doing quite well since hospital discharge, seem to be getting more energy, more awake and alert, and returning back to her baseline.  However this morning, she seemed to be more tired, sleepy, and she spiked a fever of 100.3 at home which is quite atypical for her.  Patient is essentially bedbound, but she can assist with transfers.  They deny any nausea, vomiting, says she has overall been eating decently well.  EMS was called, she was afebrile, blood pressure 136/56, heart rate 80, saturating 99% on room air with normal blood sugar.  ED Course: Patient has been stable on arrival to the emergency department, has remained afebrile.  Saturating well on room air.  Lab work was done, shows increased leukocytosis 16.5, stable anemia 9.0.  CMP essentially unremarkable.  Chest x-ray was obtained which shows possibility of right-sided pneumonia.  Urinalysis is currently pending.  Due to leukocytosis, fever earlier today, and advanced age, hospitalist was contacted for admission.  Review of Systems: Please see HPI for pertinent positives and negatives. A complete 10 system review of  systems are otherwise negative.  Past Medical History:  Diagnosis Date   Arthritis    "knees, legs" (06/10/2016)   Ascites    Chronic kidney disease    "related to my diabetes"   Chronic lower back pain    Dementia without behavioral disturbance (HCC)    Diabetes mellitus without complication (HCC)    Eczema    GERD (gastroesophageal reflux disease)    High cholesterol    Hyperlipidemia    Hypertension    Hypothyroidism    OAB (overactive bladder)    Osteoporosis    Thyroid disease    Type II diabetes mellitus (HCC)    Urticaria    Past Surgical History:  Procedure Laterality Date   ABDOMINAL AORTOGRAM W/LOWER EXTREMITY N/A 02/08/2023   Procedure: ABDOMINAL AORTOGRAM W/LOWER EXTREMITY;  Surgeon: Chuck Hint, MD;  Location: [facility] INVASIVE CV LAB;  Service: Vascular;  Laterality: N/A;   ABDOMINAL HYSTERECTOMY     KNEE ARTHROSCOPY     LAPAROTOMY N/A 12/03/2016   Procedure: EXPLORATORY LAPAROTOMY, LYSIS OF ADHESIONS;  Surgeon: Darnell Level, MD;  Location: [facility] ORS;  Service: General;  Laterality: N/A;   LOOP RECORDER INSERTION N/A 08/23/2019   Procedure: LOOP RECORDER INSERTION;  Surgeon: Marinus Maw, MD;  Location: [facility] INVASIVE CV LAB;  Service: Cardiovascular;  Laterality: N/A;   PERIPHERAL VASCULAR INTERVENTION  02/08/2023   Procedure: PERIPHERAL VASCULAR INTERVENTION;  Surgeon: Chuck Hint, MD;  Location: [facility] INVASIVE CV LAB;  Service: Vascular;;   vocal cord polyps      Social History:  reports that she has never smoked. She quit smokeless tobacco use about 39 years  ago.  Her smokeless tobacco use included chew. She reports that she does not drink alcohol and does not use drugs.   Allergies  Allergen Reactions   Nsaids Other (See Comments)    No NSAIDS due to kidney function- ESPECIALLY ibuprofen or Aleve   Biaxin [Clarithromycin] Hives   Statins Other (See Comments)    Muscle pain (myositis per daugher)   Bactrim [Sulfamethoxazole-Trimethoprim] Hives  and Rash   Lisinopril Cough   Sulfa Antibiotics Hives, Rash and Other (See Comments)    NO sulfa-based meds!!     Family History  Problem Relation Age of Onset   Diabetes Mother    Hypertension Mother    Asthma Father    Diabetes Sister    Stomach cancer Sister    Diabetes Brother    Allergic rhinitis Neg Hx    Eczema Neg Hx    Urticaria Neg Hx      Prior to Admission medications   Medication Sig Start Date End Date Taking? Authorizing Provider  acetaminophen (TYLENOL) 650 MG CR tablet Take 650 mg by mouth every 8 (eight) hours as needed for pain.    [provider]  BAYER LOW DOSE 81 MG EC tablet Take 81 mg by mouth in the morning. Swallow whole.    [provider]  cetirizine (ZYRTEC) 10 MG tablet Take 10 mg by mouth in the morning, at noon, and at bedtime.    [provider]  Cholecalciferol (VITAMIN D-3 PO) Take 2,000 Units by mouth daily.    [provider]  clopidogrel (PLAVIX) 75 MG tablet Take 1 tablet (75 mg total) by mouth daily with breakfast. 02/09/23   Baglia, Corrina, PA-C  collagenase (SANTYL) 250 UNIT/GM ointment Apply 1 Application topically 2 (two) times a week. Applied to affected areas of toes/feet.    [provider]  ezetimibe (ZETIA) 10 MG tablet Take 1 tablet (10 mg total) by mouth daily. 02/09/23 02/09/24  Chuck Hint, MD  Famotidine (PEPCID AC PO) Take 20 mg by mouth at bedtime.    [provider]  ferrous sulfate 220 (44 Fe) MG/5ML solution Take 330 mg by mouth 3 (three) times a week. 12/27/22   [provider]  folic acid (FOLVITE) 1 MG tablet Take 1 mg by mouth at bedtime.    [provider]  insulin aspart (NOVOLOG FLEXPEN) 100 UNIT/ML FlexPen Inject 2-10 Units into the skin See admin instructions. Inject 2-10 units into the skin three times a day with meals, per sliding scale:  Breakfast: BGL 80-199 = 8 units; 200-299 = 9 units; 300 or greater = 10 units Lunch: BGL 80-199 = 5  units; 200-299 = 6 units; 300 or greater = 7 units Supper/evening meal: BGL 80-199 = 2 units; 200-299 = 3 units; 300 or greater = 4 units    [provider]  insulin detemir (LEVEMIR FLEXTOUCH) 100 UNIT/ML FlexPen Inject 3-7 Units into the skin 2 (two) times daily. Inject 7 units into the skin in the morning and 3 units at bedtime    [provider]  levETIRAcetam (KEPPRA) 500 MG tablet Take 500 mg by mouth 2 (two) times daily. 01/11/23   [provider]  levothyroxine (SYNTHROID) 100 MCG tablet Take 1 tablet (100 mcg total) by mouth daily before breakfast. 09/12/22 02/03/25  Jerald Kief, MD  liver oil-zinc oxide (DESITIN) 40 % ointment Apply topically 3 (three) times daily. Patient taking differently: Apply 1 Application topically as needed for irritation. 09/16/21  Rolly Salter, MD  methotrexate (RHEUMATREX) 2.5 MG tablet Take 1 tablet (2.5 mg total) by mouth once a week. Caution:Chemotherapy. Protect from light. Patient taking differently: Take 12.5 mg by mouth once a week. Caution:Chemotherapy. Protect from light. 01/27/22   Marinda Elk, MD  metoprolol tartrate (LOPRESSOR) 25 MG tablet Take 1 tablet (25 mg total) by mouth 2 (two) times daily. Patient taking differently: Take 12.5 mg by mouth as needed (if blood pressure is over 150.). 01/20/22   Marinda Elk, MD  midodrine (PROAMATINE) 5 MG tablet Take 0.5 tablets (2.5 mg total) by mouth 3 (three) times daily with meals. 10/05/22   Osvaldo Shipper, MD  Multiple Vitamin (MULTIVITAMIN WITH MINERALS) TABS tablet Take 1 tablet by mouth in the morning.    [provider]  PLAQUENIL 200 MG tablet Take 200 mg by mouth at bedtime. 10/07/22   [provider]  predniSONE (DELTASONE) 5 MG tablet Take 5 mg by mouth daily with breakfast.    [provider]  triamcinolone ointment (KENALOG) 0.1 % Apply 1 Application topically 2 (two) times daily as needed (skin irritation). 07/16/22    [provider]    Physical Exam: BP (!) 137/49   Pulse 61   Temp 98.4 F (36.9 C) (Oral)   Resp 13   SpO2 100%   General: Patient is somnolent, arouses to voice and tactile stimulus, falls back asleep quickly.  Daughter is at the bedside.  Patient appears thin, emaciated.  Looks very comfortable. Eyes: EOMI, clear conjuctivae, white sclerea Neck: supple, no masses, trachea mildline  Cardiovascular: RRR, no murmurs or rubs, no peripheral edema  Respiratory: clear to auscultation bilaterally, no wheezes, no crackles  Abdomen: soft, nontender, nondistended Skin: dry, no rashes  Musculoskeletal: no joint effusions, normal range of motion  Psychiatric: appropriate affect, normal speech  Neurologic: extraocular muscles intact, currently not answered any questions, moving all extremities with intact sensorium          Labs on Admission:  Basic Metabolic Panel: Recent Labs  Lab 03/16/23 1743  NA 133*  K 3.8  CL 97*  CO2 29  GLUCOSE 133*  BUN 15  CREATININE 0.83  CALCIUM 9.4   Liver Function Tests: Recent Labs  Lab 03/16/23 1743  AST 44*  ALT 45*  ALKPHOS 78  BILITOT 0.4  PROT 6.2*  ALBUMIN 2.9*   No results for input(s): "LIPASE", "AMYLASE" in the last 168 hours. No results for input(s): "AMMONIA" in the last 168 hours. CBC: Recent Labs  Lab 03/16/23 1743  WBC 16.5*  NEUTROABS 14.6*  HGB 9.0*  HCT 27.5*  MCV 100.7*  PLT 251   Cardiac Enzymes: No results for input(s): "CKTOTAL", "CKMB", "CKMBINDEX", "TROPONINI" in the last 168 hours.  BNP (last 3 results) Recent Labs    09/03/22 1230  BNP 123.0*    ProBNP (last 3 results) No results for input(s): "PROBNP" in the last 8760 hours.  CBG: No results for input(s): "GLUCAP" in the last 168 hours.  Radiological Exams on Admission: CT Head Wo Contrast  Result Date: 03/16/2023 CLINICAL DATA:  Altered mental status. Weakness and confusion. Fever. 02/24/2023 EXAM: CT HEAD WITHOUT CONTRAST  TECHNIQUE: Contiguous axial images were obtained from the base of the skull through the vertex without intravenous contrast. RADIATION DOSE REDUCTION: This exam was performed according to the departmental dose-optimization program which includes automated exposure control, adjustment of the mA and/or kV according to patient size and/or use of iterative reconstruction technique. COMPARISON:  02/24/2023  CT.  09/10/2022 MRI. FINDINGS: Brain: No abnormality is seen affecting the brainstem. 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 Cerebral hemispheres show or probably represents old small vessel infarctions of the thalami, based on the prior imaging. Chronic small-vessel ischemic changes affect the cerebral hemispheric white matter. No cortical or large vessel territory stroke. The ventricles are prominent, but this is chronic and likely due to central atrophy. No mass, hemorrhage or extra-axial collection. Vascular: There is atherosclerotic calcification of the major vessels at the base of the brain. Skull: Negative Sinuses/Orbits: Clear/normal Other: None IMPRESSION: 1. No acute CT finding. Chronic small-vessel ischemic changes of the cerebral hemispheric white matter. 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 2. The ventricles are prominent, but this is chronic and likely due to central atrophy. Electronically Signed   By: Paulina Fusi M.D.   On: 03/16/2023 19:58   DG Chest 2 View  Result Date: 03/16/2023 CLINICAL DATA:  Fever EXAM: CHEST - 2 VIEW COMPARISON:  X-ray 02/24/2023 FINDINGS: Hyperinflation. No pneumothorax or effusion. Normal cardiopericardial silhouette. Calcified aorta. Loop recorder overlies the lower left hemithorax. There is some asymmetric mild opacity in the right midlung. A subtle infiltrate is possible. Recommend follow-up. This could be superior segment right lower lobe on the lateral view. Kyphotic x-ray obscures the apices. Degenerative changes.  Osteopenia. IMPRESSION: Hyperinflation. Subtle asymmetric opacity in the right midthorax, possibly superior segment right lower lobe. An infiltrate is possible. Recommend follow-up. Electronically Signed   By: Karen Kays M.D.   On: 03/16/2023 18:46    Assessment/Plan [name] [name] is a 89 y.o. female with medical history significant for manage on steroids and Plaquenil for dermatomyositis, chronic diastolic congestive heart failure, insulin-dependent type 2 diabetes, CKD stage II, who had recent hospital stays for aspiration pneumonia as well as COVID weakness most recently discharged 8/12 who is being admitted to the hospital again tonight with complaints of weakness and concern for healthcare acquired pneumonia.   Healthcare acquired pneumonia-suspected given fever, leukocytosis, chest x-ray abnormality.  No other source of infection found. -Inpatient admission -Empiric IV Zosyn  Insulin-dependent type 2 diabetes -Carb controlled diet -Sliding scale insulin  Hypothyroidism-Synthroid  Peripheral vascular disease-continue aspirin and Plavix  Hyperlipidemia-Zetia  Fever-due to suspected pneumonia as above, will also follow-up urinalysis.  Dermatomyositis-continue Plaquenil and prednisone  Chronic hypotension-continue midodrine  DVT prophylaxis: Lovenox     Code Status: DNR-advance care planning documents reviewed, also confirmed with patient's daughter at the bedside at the time of admission  Consults called: None  Admission status: The appropriate patient status for this patient is INPATIENT. Inpatient status is judged to be reasonable and necessary in order to provide the required intensity of service to ensure the patient's safety. The patient's presenting symptoms, physical exam findings, and initial radiographic and laboratory data in the context of their chronic comorbidities is felt to place them at high risk for further clinical deterioration. Furthermore, it is not  anticipated that the patient will be medically stable for discharge from the hospital within 2 midnights of admission.    I certify that at the point of admission it is my clinical judgment that the patient will require inpatient hospital care spanning beyond 2 midnights from the point of admission due to high intensity of service, high risk for further deterioration and high frequency of surveillance required  Time spent: 56 minutes  Mattea Seger Sharlette Dense MD [facility] Pager [phone]  If 7PM-7AM, please contact night-coverage [email or web address] Password [facility]  03/16/2023,  8:49 PM

## 2023-03-16 NOTE — ED Provider Notes (Signed)
[place] EMERGENCY DEPARTMENT AT [facility] Provider Note   CSN: [id number] Arrival date & time: 03/16/23  1726     History  Chief Complaint  Patient presents with   Fever    [name] [name] is a 89 y.o. female.   Fever  This is a 89 year old female history of uncontrolled diabetes with diabetic foot ulcers, history of PAD on Plavix 75 mg daily present to the emergency hospital due to an acute change in mental status and lethargy.  Has 2 daughters on bedside,reports that pt spiked a fever of 100.2 last night. They report that this is not their mum at baseline.  Daughters also reports concerns of pt's oral thrush for which she is currently managing with diflucan   Family denies any recent falls or changes in medications.  They are reported the patient has had very poor p.o. intake since last week.On exam, patient self, person and place but not to time.  Patient however do not have any acute fevers or chills at this time, Sating well on room air.     Home Medications Prior to Admission medications   Medication Sig Start Date End Date Taking? Authorizing Provider  acetaminophen (TYLENOL) 650 MG CR tablet Take 650 mg by mouth every 8 (eight) hours as needed for pain.    [provider]  BAYER LOW DOSE 81 MG EC tablet Take 81 mg by mouth in the morning. Swallow whole.    [provider]  cetirizine (ZYRTEC) 10 MG tablet Take 10 mg by mouth in the morning, at noon, and at bedtime.    [provider]  Cholecalciferol (VITAMIN D-3 PO) Take 2,000 Units by mouth daily.    [provider]  clopidogrel (PLAVIX) 75 MG tablet Take 1 tablet (75 mg total) by mouth daily with breakfast. 02/09/23   Baglia, Corrina, PA-C  collagenase (SANTYL) 250 UNIT/GM ointment Apply 1 Application topically 2 (two) times a week. Applied to affected areas of toes/feet.    [provider]  ezetimibe (ZETIA) 10 MG tablet Take 1 tablet (10 mg total) by mouth  daily. 02/09/23 02/09/24  Chuck Hint, MD  Famotidine (PEPCID AC PO) Take 20 mg by mouth at bedtime.    [provider]  ferrous sulfate 220 (44 Fe) MG/5ML solution Take 330 mg by mouth 3 (three) times a week. 12/27/22   [provider]  folic acid (FOLVITE) 1 MG tablet Take 1 mg by mouth at bedtime.    [provider]  insulin aspart (NOVOLOG FLEXPEN) 100 UNIT/ML FlexPen Inject 2-10 Units into the skin See admin instructions. Inject 2-10 units into the skin three times a day with meals, per sliding scale:  Breakfast: BGL 80-199 = 8 units; 200-299 = 9 units; 300 or greater = 10 units Lunch: BGL 80-199 = 5 units; 200-299 = 6 units; 300 or greater = 7 units Supper/evening meal: BGL 80-199 = 2 units; 200-299 = 3 units; 300 or greater = 4 units    [provider]  insulin detemir (LEVEMIR FLEXTOUCH) 100 UNIT/ML FlexPen Inject 3-7 Units into the skin 2 (two) times daily. Inject 7 units into the skin in the morning and 3 units at bedtime    [provider]  levETIRAcetam (KEPPRA) 500 MG tablet Take 500 mg by mouth 2 (two) times daily. 01/11/23   [provider]  levothyroxine (SYNTHROID) 100 MCG tablet Take 1 tablet (100 mcg total) by mouth daily before breakfast. 09/12/22 02/03/25  Rhona Leavens,  Scheryl Marten, MD  liver oil-zinc oxide (DESITIN) 40 % ointment Apply topically 3 (three) times daily. Patient taking differently: Apply 1 Application topically as needed for irritation. 09/16/21   Rolly Salter, MD  methotrexate (RHEUMATREX) 2.5 MG tablet Take 1 tablet (2.5 mg total) by mouth once a week. Caution:Chemotherapy. Protect from light. Patient taking differently: Take 12.5 mg by mouth once a week. Caution:Chemotherapy. Protect from light. 01/27/22   Marinda Elk, MD  metoprolol tartrate (LOPRESSOR) 25 MG tablet Take 1 tablet (25 mg total) by mouth 2 (two) times daily. Patient taking differently: Take 12.5 mg by mouth as needed (if blood pressure is  over 150.). 01/20/22   Marinda Elk, MD  midodrine (PROAMATINE) 5 MG tablet Take 0.5 tablets (2.5 mg total) by mouth 3 (three) times daily with meals. 10/05/22   Osvaldo Shipper, MD  Multiple Vitamin (MULTIVITAMIN WITH MINERALS) TABS tablet Take 1 tablet by mouth in the morning.    [provider]  PLAQUENIL 200 MG tablet Take 200 mg by mouth at bedtime. 10/07/22   [provider]  predniSONE (DELTASONE) 5 MG tablet Take 5 mg by mouth daily with breakfast.    [provider]  triamcinolone ointment (KENALOG) 0.1 % Apply 1 Application topically 2 (two) times daily as needed (skin irritation). 07/16/22   [provider]      Allergies    Nsaids, Biaxin [clarithromycin], Statins, Bactrim [sulfamethoxazole-trimethoprim], Lisinopril, and Sulfa antibiotics    Review of Systems   Review of Systems  Constitutional:  Positive for fever.    Physical Exam Updated Vital Signs BP (!) 135/58   Pulse 78   Temp 98 F (36.7 C) (Oral)   Resp (!) 21   SpO2 98%    Physical Exam Constitutional:      Appearance: She is ill-appearing and toxic-appearing.  HENT:     Head: Normocephalic and atraumatic.  Pulmonary:     Effort: Pulmonary effort is normal.     Comments: Mild wheezing present on right lower lobe Abdominal:     General: Abdomen is flat. There is no distension.     Palpations: Abdomen is soft.     Tenderness: There is no abdominal tenderness. There is no guarding.     ED Results / Procedures / Treatments   Labs (all labs ordered are listed, but only abnormal results are displayed) Labs Reviewed  COMPREHENSIVE METABOLIC PANEL - Abnormal; Notable for the following components:      Result Value   Sodium 133 (*)    Chloride 97 (*)    Glucose, Bld 133 (*)    Total Protein 6.2 (*)    Albumin 2.9 (*)    AST 44 (*)    ALT 45 (*)    All other components within normal limits  CBC WITH DIFFERENTIAL/PLATELET - Abnormal; Notable for the following  components:   WBC 16.5 (*)    RBC 2.73 (*)    Hemoglobin 9.0 (*)    HCT 27.5 (*)    MCV 100.7 (*)    Neutro Abs 14.6 (*)    Abs Immature Granulocytes 0.09 (*)    All other components within normal limits  CULTURE, BLOOD (ROUTINE X 2)  CULTURE, BLOOD (ROUTINE X 2)  URINALYSIS, W/ REFLEX TO CULTURE (INFECTION SUSPECTED)  I-STAT CG4 LACTIC ACID, ED  I-STAT CG4 LACTIC ACID, ED    EKG EKG Interpretation Date/Time:  Tuesday March 16 2023 17:45:19 EDT Ventricular Rate:  81 PR Interval:  218 QRS  Duration:  92 QT Interval:  383 QTC Calculation: 445 R Axis:   50  Text Interpretation: Sinus rhythm Atrial premature complex Borderline prolonged PR interval when cmpared to prior, similar appearace with artifact. No STEMI Confirmed by Theda Belfast ([id number]) on 03/16/2023 6:22:16 PM  Radiology DG Chest 2 View  Result Date: 03/16/2023 CLINICAL DATA:  Fever EXAM: CHEST - 2 VIEW COMPARISON:  X-ray 02/24/2023 FINDINGS: Hyperinflation. No pneumothorax or effusion. Normal cardiopericardial silhouette. Calcified aorta. Loop recorder overlies the lower left hemithorax. There is some asymmetric mild opacity in the right midlung. A subtle infiltrate is possible. Recommend follow-up. This could be superior segment right lower lobe on the lateral view. Kyphotic x-ray obscures the apices. Degenerative changes. Osteopenia. IMPRESSION: Hyperinflation. Subtle asymmetric opacity in the right midthorax, possibly superior segment right lower lobe. An infiltrate is possible. Recommend follow-up. Electronically Signed   By: Karen Kays M.D.   On: 03/16/2023 18:46    Procedures Procedures    Medications Ordered in ED Medications - No data to display  ED Course/ Medical Decision Making/ A&P                                 Medical Decision Making Amount and/or Complexity of Data Reviewed Labs: ordered.    Details: CBC,CMP. UA  Radiology: ordered and independent interpretation performed.    Details:  CXR,CT HEAD W/O CONTRAST ECG/medicine tests: ordered and independent interpretation performed.  Risk Decision regarding hospitalization.   #Altered Mental status #Pneumonia Patient is 89 year old female presenting due to concerns for acute altered mental status.  Of note she recently had a pneumonia for which she did not receive treatment.  Patient report that the patient spiked a fever last night of 100.2.  CBC remarkable for WBC of 16.5.  Checks x-ray show evidence of subtle asymmetric opacity in the right midthorax,possibly superior segment right lower lobe concerning for pneumonia. Getting a urine sample now to rule out UTI  - Admit Patient for treatment of pneumonia and further work up          Final Clinical Impression(s) / ED Diagnoses Final diagnoses:  None    Rx / DC Orders ED Discharge Orders     None         Kathleen Lime, MD 03/16/23 2052    Tegeler, Canary Brim, MD 03/17/23 1058

## 2023-03-16 NOTE — ED Triage Notes (Signed)
BIBA from home for increased weakness, confusion, fever noticed today of 100.3. A&O x 2, pt is bed bound, hx of dementia. 650 mg Tylenol, 400 cc NS given PTA. 136/56 BP 80 HR 99% room air 181 cbg

## 2023-03-17 DIAGNOSIS — J189 Pneumonia, unspecified organism: Secondary | ICD-10-CM | POA: Diagnosis not present

## 2023-03-17 LAB — GLUCOSE, CAPILLARY
Glucose-Capillary: 158 mg/dL — ABNORMAL HIGH (ref 70–99)
Glucose-Capillary: 250 mg/dL — ABNORMAL HIGH (ref 70–99)
Glucose-Capillary: 285 mg/dL — ABNORMAL HIGH (ref 70–99)
Glucose-Capillary: 257 mg/dL — ABNORMAL HIGH (ref 70–99)
Glucose-Capillary: 256 mg/dL — ABNORMAL HIGH (ref 70–99)

## 2023-03-17 LAB — BASIC METABOLIC PANEL
Anion gap: 8 (ref 5–15)
BUN: 15 mg/dL (ref 8–23)
CO2: 29 mmol/L (ref 22–32)
Calcium: 9.3 mg/dL (ref 8.9–10.3)
Chloride: 96 mmol/L — ABNORMAL LOW (ref 98–111)
Creatinine, Ser: 0.8 mg/dL (ref 0.44–1.00)
GFR, Estimated: >60 mL/min (ref >60)
Glucose, Bld: 220 mg/dL — ABNORMAL HIGH (ref 70–99)
Potassium: 4.1 mmol/L (ref 3.5–5.1)
Sodium: 133 mmol/L — ABNORMAL LOW (ref 135–145)

## 2023-03-17 LAB — CBC
HCT: 25.6 % — ABNORMAL LOW (ref 36.0–46.0)
Hemoglobin: 8.2 g/dL — ABNORMAL LOW (ref 12.0–15.0)
MCH: 32.8 pg (ref 26.0–34.0)
MCHC: 32 g/dL (ref 30.0–36.0)
MCV: 102.4 fL — ABNORMAL HIGH (ref 80.0–100.0)
Platelets: 238 10*3/uL (ref 150–400)
RBC: 2.5 MIL/uL — ABNORMAL LOW (ref 3.87–5.11)
RDW: 15.9 % — ABNORMAL HIGH (ref 11.5–15.5)
WBC: 11.3 10*3/uL — ABNORMAL HIGH (ref 4.0–10.5)
nRBC: 0 % (ref 0.0–0.2)

## 2023-03-17 LAB — PROCALCITONIN: Procalcitonin: 0.52 ng/mL

## 2023-03-17 MED ORDER — COLLAGENASE 250 UNIT/GM EX OINT
TOPICAL_OINTMENT | Freq: Every day | CUTANEOUS | Status: DC
  Filled 2023-03-17: qty 30

## 2023-03-17 NOTE — Progress Notes (Signed)
OT Cancellation Note  Patient Details Name: [name] [name] MRN: [id number] DOB: 04-13-1934   Cancelled Treatment:    Reason Eval/Treat Not Completed: OT screened, no needs identified, will sign off. This pt is known to this OT from prior hospitalizations. The pt has presented with gradual functional decline over the past few months, subsequently requiring significant assist with all self-care. Per pt's daughter, family is not interested in SNF rehab for the pt; pt's daughter also indicated during last visit that therapy has not been of significant benefit to the pt, given her significant functional limitations. Given this, OT will sign off. Please re-consult if there is a change in pt status and/or pt's family desires therapy to going forward. Thank you.   Reuben Likes, OTR/L 03/17/2023, 5:11 PM

## 2023-03-17 NOTE — Progress Notes (Signed)
Progress Note    [name] [name]   ZOX:[id number]  DOB: 05-27-1934  DOA: 03/16/2023     1 PCP: [name], MD  Initial CC: Weakness  Hospital Course: [name] [name] is an 89 yo female with PMH dermatomyositis on Plaquenil, chronic diastolic CHF, DM II, CKD 2 who presented with worsening weakness after recent discharge on 03/01/2023. At baseline, patient is a poor historian but has 24/7 care at home. She was recently hospitalized for COVID-19 infection and was treated with Paxlovid.  Per last DC summary, she is also mostly bedbound and requires assistance for transfers and her ADLs.  CXR performed on admission also was notable for right lower lobe opacity concerning for infiltrate.  She was started on Zosyn for coverage.  Interval History:  No events overnight.  Patient still a poor historian but resting comfortably when seen this morning.  Unable to provide any collateral information.  Assessment and Plan:  Weakness CAP Recent COVID-19 infection -Initially tested positive 02/24/2023; may be having some waxing/waning of weakness associated with infection plus still suspect some worsened conditioning from hospitalization  - continue supportive care - continue [facility] PT/OT at discharge  - possible RLL infiltrate on CXR; PCT mildly elevated as well; WBC elevated on admission also - continue zosyn and can de-escalate probably tomorrow   Recurrent oral thrush Was started on Diflucan and nystatin. Improved swallowing also improved. She will continue Diflucan as an outpatient.   Hypovolemic hyponatremia Resolved with IV fluids   Uncontrolled diabetes mellitus type 2 With a last A1c of 8.4 No changes made to her medication   Hx orthostasis No change made to her medication.   Hypothyroidism continue Synthroid.   Dermatomyositis -Continue prednisone and Plaquenil outpatient   Moderate protein caloric malnutrition Continue Ensure 3 times daily  History of seizure Continue Keppra    Peripheral artery disease Continue aspirin and Plavix   Stage 1 pressure ulcer - noted    Old records reviewed in assessment of this patient  Antimicrobials: Zosyn 03/16/2023 >> current  DVT prophylaxis:  enoxaparin (LOVENOX) injection 40 mg Start: 03/16/23 2200 SCDs Start: 03/16/23 2047   Code Status:   Code Status: DNR  Mobility Assessment (Last 72 Hours)     Mobility Assessment     Row Name 03/17/23 1046 03/17/23 1034 03/16/23 2300       Does patient have an order for bedrest or is patient medically unstable -- No - Continue assessment No - Continue assessment     What is the highest level of mobility based on the progressive mobility assessment? Level 1 (Bedfast) - Unable to balance while sitting on edge of bed Level 1 (Bedfast) - Unable to balance while sitting on edge of bed Level 2 (Chairfast) - Balance while sitting on edge of bed and cannot stand     Is the above level different from baseline mobility prior to current illness? -- Yes - Recommend PT order Yes - Recommend PT order              Barriers to discharge: none Disposition Plan:  Home with HH Status is: Inpt  Objective: Blood pressure (!) 155/61, pulse 64, temperature 99.2 F (37.3 C), temperature source Oral, resp. rate 15, SpO2 100%.  Examination:  Physical Exam Constitutional:      Comments: Chronically ill appearing but NAD  HENT:     Head: Normocephalic and atraumatic.     Mouth/Throat:     Mouth: Mucous membranes are dry.  Eyes:  Extraocular Movements: Extraocular movements intact.  Cardiovascular:     Rate and Rhythm: Normal rate and regular rhythm.  Pulmonary:     Effort: Pulmonary effort is normal. No respiratory distress.     Breath sounds: Normal breath sounds. No wheezing.  Abdominal:     General: Bowel sounds are normal. There is no distension.     Palpations: Abdomen is soft.     Tenderness: There is no abdominal tenderness.  Musculoskeletal:        General: Normal  range of motion.     Cervical back: Normal range of motion and neck supple.  Skin:    General: Skin is warm.  Neurological:     Mental Status: She is disoriented.     Comments: Follows commands and moves all 4 extremities      Consultants:    Procedures:    Data Reviewed: Results for orders placed or performed during the hospital encounter of 03/16/23 (from the past 24 hour(s))  Comprehensive metabolic panel     Status: Abnormal   Collection Time: 03/16/23  5:43 PM  Result Value Ref Range   Sodium 133 (L) 135 - 145 mmol/L   Potassium 3.8 3.5 - 5.1 mmol/L   Chloride 97 (L) 98 - 111 mmol/L   CO2 29 22 - 32 mmol/L   Glucose, Bld 133 (H) 70 - 99 mg/dL   BUN 15 8 - 23 mg/dL   Creatinine, Ser 1.61 0.44 - 1.00 mg/dL   Calcium 9.4 8.9 - 09.6 mg/dL   Total Protein 6.2 (L) 6.5 - 8.1 g/dL   Albumin 2.9 (L) 3.5 - 5.0 g/dL   AST 44 (H) 15 - 41 U/L   ALT 45 (H) 0 - 44 U/L   Alkaline Phosphatase 78 38 - 126 U/L   Total Bilirubin 0.4 0.3 - 1.2 mg/dL   GFR, Estimated >04 >54 mL/min   Anion gap 7 5 - 15  CBC with Differential     Status: Abnormal   Collection Time: 03/16/23  5:43 PM  Result Value Ref Range   WBC 16.5 (H) 4.0 - 10.5 K/uL   RBC 2.73 (L) 3.87 - 5.11 MIL/uL   Hemoglobin 9.0 (L) 12.0 - 15.0 g/dL   HCT 09.8 (L) 11.9 - 14.7 %   MCV 100.7 (H) 80.0 - 100.0 fL   MCH 33.0 26.0 - 34.0 pg   MCHC 32.7 30.0 - 36.0 g/dL   RDW 82.9 56.2 - 13.0 %   Platelets 251 150 - 400 K/uL   nRBC 0.0 0.0 - 0.2 %   Neutrophils Relative % 88 %   Neutro Abs 14.6 (H) 1.7 - 7.7 K/uL   Lymphocytes Relative 7 %   Lymphs Abs 1.1 0.7 - 4.0 K/uL   Monocytes Relative 4 %   Monocytes Absolute 0.7 0.1 - 1.0 K/uL   Eosinophils Relative 0 %   Eosinophils Absolute 0.1 0.0 - 0.5 K/uL   Basophils Relative 0 %   Basophils Absolute 0.0 0.0 - 0.1 K/uL   Immature Granulocytes 1 %   Abs Immature Granulocytes 0.09 (H) 0.00 - 0.07 K/uL  I-Stat Lactic Acid, ED     Status: None   Collection Time: 03/16/23  5:53  PM  Result Value Ref Range   Lactic Acid, Venous 1.4 0.5 - 1.9 mmol/L  Blood culture (routine x 2)     Status: None (Preliminary result)   Collection Time: 03/16/23  6:06 PM   Specimen: BLOOD RIGHT HAND  Result Value Ref  Range   Specimen Description      BLOOD RIGHT HAND Performed at [facility], [address], [place], [place] [address]    Special Requests      AEROBIC BOTTLE ONLY Blood Culture adequate volume Performed at [facility], [address] [place], [place] [address]    Culture      NO GROWTH < 12 HOURS Performed at [facility], [address], [place], [place] [address]    Report Status PENDING   Blood culture (routine x 2)     Status: None (Preliminary result)   Collection Time: 03/16/23  6:07 PM   Specimen: BLOOD LEFT FOREARM  Result Value Ref Range   Specimen Description      BLOOD LEFT FOREARM Performed at [facility], [address], [place], [place] [address]    Special Requests      BOTTLES DRAWN AEROBIC AND ANAEROBIC Blood Culture results may not be optimal due to an excessive volume of blood received in culture bottles Performed at [facility], [address] [place], [place] [address]    Culture      NO GROWTH < 12 HOURS Performed at [facility], [address], [place], [place] [address]    Report Status PENDING   Urinalysis, w/ Reflex to Culture (Infection Suspected) -Urine, Clean Catch     Status: Abnormal   Collection Time: 03/16/23  8:36 PM  Result Value Ref Range   Specimen Source URINE, CLEAN CATCH    Color, Urine YELLOW YELLOW   APPearance CLEAR CLEAR   Specific Gravity, Urine 1.008 1.005 - 1.030   pH 7.0 5.0 - 8.0   Glucose, UA 50 (A) NEGATIVE mg/dL   Hgb urine dipstick NEGATIVE NEGATIVE   Bilirubin Urine NEGATIVE NEGATIVE   Ketones, ur NEGATIVE NEGATIVE mg/dL   Protein, ur NEGATIVE NEGATIVE mg/dL   Nitrite NEGATIVE NEGATIVE   Leukocytes,Ua  NEGATIVE NEGATIVE   RBC / HPF 0-5 0 - 5 RBC/hpf   WBC, UA 0-5 0 - 5 WBC/hpf   Bacteria, UA NONE SEEN NONE SEEN   Squamous Epithelial / HPF 0-5 0 - 5 /HPF  Glucose, capillary     Status: Abnormal   Collection Time: 03/16/23  9:30 PM  Result Value Ref Range   Glucose-Capillary 60 (L) 70 - 99 mg/dL  Glucose, capillary     Status: Abnormal   Collection Time: 03/16/23 10:44 PM  Result Value Ref Range   Glucose-Capillary 103 (H) 70 - 99 mg/dL  Glucose, capillary     Status: Abnormal   Collection Time: 03/17/23  2:44 AM  Result Value Ref Range   Glucose-Capillary 158 (H) 70 - 99 mg/dL  Basic metabolic panel     Status: Abnormal   Collection Time: 03/17/23  5:37 AM  Result Value Ref Range   Sodium 133 (L) 135 - 145 mmol/L   Potassium 4.1 3.5 - 5.1 mmol/L   Chloride 96 (L) 98 - 111 mmol/L   CO2 29 22 - 32 mmol/L   Glucose, Bld 220 (H) 70 - 99 mg/dL   BUN 15 8 - 23 mg/dL   Creatinine, Ser 2.95 0.44 - 1.00 mg/dL   Calcium 9.3 8.9 - 28.4 mg/dL   GFR, Estimated >13 >24 mL/min   Anion gap 8 5 - 15  CBC     Status: Abnormal   Collection Time: 03/17/23  5:37 AM  Result Value Ref Range   WBC  11.3 (H) 4.0 - 10.5 K/uL   RBC 2.50 (L) 3.87 - 5.11 MIL/uL   Hemoglobin 8.2 (L) 12.0 - 15.0 g/dL   HCT 16.1 (L) 09.6 - 04.5 %   MCV 102.4 (H) 80.0 - 100.0 fL   MCH 32.8 26.0 - 34.0 pg   MCHC 32.0 30.0 - 36.0 g/dL   RDW 40.9 (H) 81.1 - 91.4 %   Platelets 238 150 - 400 K/uL   nRBC 0.0 0.0 - 0.2 %  Procalcitonin     Status: None   Collection Time: 03/17/23  5:37 AM  Result Value Ref Range   Procalcitonin 0.52 ng/mL  Glucose, capillary     Status: Abnormal   Collection Time: 03/17/23  7:30 AM  Result Value Ref Range   Glucose-Capillary 250 (H) 70 - 99 mg/dL  Glucose, capillary     Status: Abnormal   Collection Time: 03/17/23 11:45 AM  Result Value Ref Range   Glucose-Capillary 285 (H) 70 - 99 mg/dL    I have reviewed pertinent nursing notes, vitals, labs, and images as necessary. I have  ordered labwork to follow up on as indicated.  I have reviewed the last notes from staff over past 24 hours. I have discussed patient's care plan and test results with nursing staff, CM/SW, and other staff as appropriate.  Time spent: Greater than 50% of the 55 minute visit was spent in counseling/coordination of care for the patient as laid out in the A&P.   LOS: 1 day   Lewie Chamber, MD [facility] 03/17/2023, 3:39 PM

## 2023-03-17 NOTE — Consult Note (Signed)
WOC Nurse Consult Note: Reason for Consult: Vascular wounds to tip of left and right great toes.  Seen at wound care center.  Using santyl for debridement.  Likely vascular intervention (angiogram) tentatively planned.  Currently with Covid and and subsequent pneumonia . Moisture associated skin damage to buttocks and sacral area.  Wound type: vascular and moisture Pressure Injury POA: NA Measurement: Tips of toes, left and right:  0.2 cm round eschar.  Partial thickness tissue loss to buttocks and right sacral area, will treat with barrier cream.  Wound bed: see above  Drainage (amount, consistency, odor) none noted Periwound:intact  impaired pulses in lower extremities, warm to touch.  Dressing procedure/placement/frequency: Cleanse right and left great toe wounds with NS and pat dry. Apply santyl to wound bed.  Cover with NS moist gauze and wrap toe with gauze and tape daily.  Barrier cream to buttocks and sacral area twice daily and PRN soilage.  Will not follow at this time.  Please re-consult if needed.  Mike Gip MSN, RN, FNP-BC CWON Wound, Ostomy, Continence Nurse Outpatient [facility] [phone] Pager [phone]

## 2023-03-17 NOTE — TOC Initial Note (Signed)
Transition of Care [facility]) - Initial/Assessment Note    Patient Details  Name: [name] [name] MRN: [id number] Date of Birth: September 30, 1933  Transition of Care [facility]) CM/SW Contact:    Otelia Santee, LCSW Phone Number: 03/17/2023, 3:15 PM  Clinical Narrative:                 Spoke with pt's daughter via t/c to discuss recommendation for SNF placement. [name] shares that pt has been to SNF in the past and did not have a good experience. She shares that due to pt's dx of Dermatopolymyositis that pt does not benefit from rehab. [name] request that it be noted that pt will not go to SNF now or in the future. She would like to no longer be asked about SNF placement for her mother.   Pt lives with daughter and receives HHPT/OT/RN w/ [facility]. ROC orders will need to be placed at discharge. Pt has Gilmer Mor, RW, wheelchair, and BSC at home. Pt has no further DME needs. [name] declines PTAR home and shares that family will provide transportation for her at discharge.   Expected Discharge Plan: Home w Home Health Services Barriers to Discharge: No Barriers Identified   Patient Goals and CMS Choice Patient states their goals for this hospitalization and ongoing recovery are:: For pt to return home CMS Medicare.gov Compare Post Acute Care list provided to:: Legal Guardian Choice offered to / list presented to : [facility] POA / Guardian, Adult Children [place] ownership interest in [facility].provided to:: [facility] POA / Guardian    Expected Discharge Plan and Services In-house Referral: Clinical Social Work Discharge Planning Services: NA Post Acute Care Choice: Resumption of Svcs/PTA Provider, Home Health Living arrangements for the past 2 months: Single Family Home                 DME Arranged: N/A DME Agency: NA                  Prior Living Arrangements/Services Living arrangements for the past 2 months: Single Family Home Lives with:: Adult Children Patient  language and need for interpreter reviewed:: Yes Do you feel safe going back to the place where you live?: Yes      Need for Family Participation in Patient Care: Yes (Comment) Care giver support system in place?: Yes (comment) Current home services: DME, Home PT, Home RN, Home OT ([employer], RW, BSC, wheelchair, [facility] w/ [facility]) Criminal Activity/Legal Involvement Pertinent to Current Situation/Hospitalization: No - Comment as needed  Activities of Daily Living Home Assistive Devices/Equipment: Dentures (specify type) ADL Screening (condition at time of admission) Patient's cognitive ability adequate to safely complete daily activities?: No Is the patient deaf or have difficulty hearing?: No Does the patient have difficulty seeing, even when wearing glasses/contacts?: No Does the patient have difficulty concentrating, remembering, or making decisions?: Yes Patient able to express need for assistance with ADLs?: Yes Does the patient have difficulty dressing or bathing?: Yes Independently performs ADLs?: No Communication: Independent Dressing (OT): Dependent Is this a change from baseline?: Pre-admission baseline Grooming: Dependent Is this a change from baseline?: Pre-admission baseline Feeding: Needs assistance Is this a change from baseline?: Pre-admission baseline Bathing: Dependent Is this a change from baseline?: Pre-admission baseline Toileting: Dependent Is this a change from baseline?: Pre-admission baseline In/Out Bed: Dependent Is this a change from baseline?: Pre-admission baseline Walks in Home: Dependent Is this a change from baseline?: Pre-admission baseline Does the patient have difficulty walking  or climbing stairs?: Yes Weakness of Legs: Both Weakness of Arms/Hands: Both  Permission Sought/Granted Permission sought to share information with : Family Supports Permission granted to share information with : Yes, Verbal Permission Granted  Share Information with NAME:  [name]  Permission granted to share info w AGENCY: [facility]  Permission granted to share info w Relationship: Daughter  Permission granted to share info w Contact Information: [phone]  Emotional Assessment Appearance:: Appears stated age Attitude/Demeanor/Rapport: Lethargic Affect (typically observed): Pleasant Orientation: : Oriented to Self, Oriented to Place Alcohol / Substance Use: Not Applicable Psych Involvement: No (comment)  Admission diagnosis:  CAP (community acquired pneumonia) [J18.9] Febrile illness [R50.9] Fatigue, unspecified type [R53.83] Patient Active Problem List   Diagnosis Date Noted   CAP (community acquired pneumonia) 03/16/2023   COVID-19 02/24/2023   Weakness 02/24/2023   AMS (altered mental status) 02/15/2023   Paraparesis (HCC) 01/04/2023   Other specified abnormal findings of blood chemistry 01/04/2023   Cervical radiculitis 01/04/2023   Cervical spinal stenosis 01/04/2023   UTI (urinary tract infection) 10/03/2022   Sepsis due to urinary tract infection (HCC) 10/02/2022   Normocytic anemia 07/27/2022   Dementia without behavioral disturbance (HCC) 07/27/2022   Malnutrition of moderate degree 05/11/2022   PNA (pneumonia) 01/19/2022   Syncope and collapse 09/13/2021   Dermatopolymyositis, unspecified, organ involvement unspecified (HCC) 09/13/2021   Poor appetite 09/13/2021   Hyponatremia 09/13/2021   Uncontrolled diabetes mellitus with hypoglycemia without coma, with long-term current use of insulin (HCC) 09/13/2021   Sacral ulcer (HCC) 09/13/2021   Oropharyngeal dysphagia 05/15/2021   Allergic conjunctivitis of both eyes 03/15/2020   PAD (peripheral artery disease) (HCC) 10/24/2019   Malnutrition (HCC) 10/10/2019   History of seizure 10/08/2019   Right shoulder pain 08/21/2019   Leukocytosis 08/21/2019   Acute encephalopathy 08/18/2019   Enterocolitis 08/18/2019   Hyperglycemia 08/18/2019   Chronic kidney disease    Pressure  injury of skin 12/09/2016   SBO (small bowel obstruction) s/p lysis of adhesions 12/03/2016 12/04/2016   Acute urinary retention 11/27/2016   Ascites 11/27/2016   Abdominal distension    Facial droop    Hyperkalemia 11/25/2016   Sepsis (HCC) 11/25/2016   Acute renal failure superimposed on stage 2 chronic kidney disease (HCC) 11/25/2016   Delirium 11/25/2016   Age-related osteoporosis without current pathological fracture 10/01/2016   GERD (gastroesophageal reflux disease) 06/10/2016   Insomnia 06/10/2016   Viral gastroenteritis    Right hip pain 05/06/2015   Vaginal atrophy 11/01/2013   ECZEMA 07/24/2008   INTERMITTENT VERTIGO 07/24/2008   Constipation 11/16/2007   INSOMNIA 08/12/2007   Oral thrush 08/03/2007   Hypertension with hx of orthostatic hypotension 02/14/2007   Allergic rhinitis due to allergen 02/14/2007   Hypothyroidism, acquired 01/26/2007   Uncontrolled type 2 diabetes mellitus with hypoglycemia, with long-term current use of insulin (HCC) 01/26/2007   GERD 01/26/2007   DEGENERATIVE JOINT DISEASE 01/26/2007   PCP:  [name], MD Pharmacy:   [id number] - [place], [place] - [address] AT [facility] [address] [place] [address] Phone: [phone] Fax: [phone]     Social Determinants of Health (SDOH) Social History: SDOH Screenings   Food Insecurity: No Food Insecurity (03/16/2023)  Housing: Low Risk  (03/16/2023)  Transportation Needs: No Transportation Needs (03/16/2023)  Utilities: Not At Risk (03/16/2023)  Social Connections: Unknown (12/02/2021)   Received from [facility]  Tobacco Use: Medium Risk (03/16/2023)  SDOH Interventions:     Readmission Risk Interventions    03/17/2023    2:50 PM 02/16/2023   11:04 AM 10/04/2022   11:17 AM  Readmission Risk Prevention Plan  Transportation Screening Complete Complete Complete  PCP or Specialist Appt within 3-5 Days   Complete   HRI or Home Care Consult   Complete  Social Work Consult for Recovery Care Planning/Counseling   Complete  Palliative Care Screening   Not Applicable  Medication Review [employer]) Complete  Complete  PCP or Specialist appointment within 3-5 days of discharge Complete    HRI or Home Care Consult Complete Complete   SW Recovery Care/Counseling Consult Complete Complete   Palliative Care Screening Not Applicable    Skilled Nursing Facility Patient Refused

## 2023-03-17 NOTE — Evaluation (Signed)
Physical Therapy Evaluation Patient Details Name: [name] [name] MRN: [id number] DOB: August 28, 1933 Today's Date: 03/17/2023  History of Present Illness  89 y.o. female with medical history significant for dermatomyositis, chronic diastolic congestive heart failure, insulin-dependent type 2 diabetes, CKD stage II, who had recent hospital stays for aspiration pneumonia as well as COVID and weakness discharged 8/12 who is being admitted to the hospital with complaints of weakness and concern for healthcare acquired pneumonia.  Clinical Impression  Pt admitted with above diagnosis. Total assist for supine to sit, mod/max assist for sitting balance 2* heavy R lateral lean. Pt unable to maintain trunk in upright position in sitting. Pt is poor historian, per recent PT eval 02/26/23 pt lives with daughter and has 24/7 assist at home, family assists pt for wheelchair transfers. Will need to confirm family can provide needed level of assistance if pt DCs home as she's requiring increased assistance this session compared to recent admission earlier in August.  Pt currently with functional limitations due to the deficits listed below (see PT Problem List). Pt will benefit from acute skilled PT to increase their independence and safety with mobility to allow discharge.           If plan is discharge home, recommend the following: Two people to help with walking and/or transfers;A lot of help with bathing/dressing/bathroom;Assistance with cooking/housework;Direct supervision/assist for medications management;Direct supervision/assist for financial management;Assist for transportation;Help with stairs or ramp for entrance;Supervision due to cognitive status   Can travel by private vehicle   No    Equipment Recommendations None recommended by PT  Recommendations for Other Services       Functional Status Assessment Patient has had a recent decline in their functional status and demonstrates the ability to make  significant improvements in function in a reasonable and predictable amount of time.     Precautions / Restrictions Precautions Precautions: Fall Restrictions Weight Bearing Restrictions: No      Mobility  Bed Mobility Overal bed mobility: Needs Assistance Bed Mobility: Supine to Sit, Sit to Supine     Supine to sit: Total assist Sit to supine: Total assist   General bed mobility comments: increased time, multimodal cues, assist to raise trunk and pivot hips to EOB (pt 15%), assist to return to supine (pt 0%). Heavy R lateral lean sitting EOB, pt unable to maintain trunk in baseline with multiple verbal/tactile cues.    Transfers                   General transfer comment: NT 2* poor sitting balance    Ambulation/Gait                  Stairs            Wheelchair Mobility     Tilt Bed    Modified Rankin (Stroke Patients Only)       Balance Overall balance assessment: Needs assistance Sitting-balance support: Feet supported, Single extremity supported Sitting balance-Leahy Scale: Zero Sitting balance - Comments: R lateral lean, pt unable to maintain trunk in neutral position, mod/max assist to keep trunk upright Postural control: Right lateral lean Standing balance support:  (with therapist)                                 Pertinent Vitals/Pain Pain Assessment Faces Pain Scale: Hurts even more Pain Location: R great toe with touch Pain Descriptors / Indicators: Grimacing  Home Living Family/patient expects to be discharged to:: Private residence Living Arrangements: Children Available Help at Discharge: Family;Available 24 hours/day Type of Home: House Home Access: Stairs to enter Entrance Stairs-Rails: None Entrance Stairs-Number of Steps: 2   Home Layout: One level Home Equipment: Agricultural consultant (2 wheels);Shower seat - built in;Wheelchair - manual;BSC/3in1;Hospital bed Additional Comments: family provides 27/7  supervision and assist; all information obtained from prior PT eval 02/26/23 as no family present this session and pt is poor historian    Prior Function Prior Level of Function : Needs assist;Patient poor historian/Family not available       Physical Assist : ADLs (physical);Mobility (physical) Mobility (physical): Gait;Transfers;Bed mobility ADLs (physical): Feeding;Grooming;Bathing;Dressing;IADLs;Toileting Mobility Comments: Family assists with mobility for safety; uses w/c, SPTs ADLs Comments: total A with ADLs/selfcare, feeds herself per her daughter     Extremity/Trunk Assessment   Upper Extremity Assessment Upper Extremity Assessment: Defer to OT evaluation    Lower Extremity Assessment Lower Extremity Assessment: RLE deficits/detail;LLE deficits/detail;Difficult to assess due to impaired cognition RLE Deficits / Details: dressed wound noted R great toe, painful to touch, did not respond to commands to extend knee actively in sitting LLE Deficits / Details: did not respond to commands to actively extend knee in sitting    Cervical / Trunk Assessment Cervical / Trunk Assessment: Kyphotic  Communication   Communication Communication: Difficulty communicating thoughts/reduced clarity of speech (low volume of speech, minimal or no response to questions)  Cognition Arousal: Alert Behavior During Therapy: Flat affect Overall Cognitive Status: No family/caregiver present to determine baseline cognitive functioning                                 General Comments: pt did not respond to most questions/commands        General Comments      Exercises     Assessment/Plan    PT Assessment Patient needs continued PT services  PT Problem List Decreased strength;Decreased activity tolerance;Decreased balance;Decreased mobility;Decreased coordination       PT Treatment Interventions DME instruction;Functional mobility training;Therapeutic activities;Therapeutic  exercise;Balance training;Neuromuscular re-education;Patient/family education    PT Goals (Current goals can be found in the Care Plan section)  Acute Rehab PT Goals Patient Stated Goal: go home PT Goal Formulation: Patient unable to participate in goal setting Time For Goal Achievement: 03/31/23 Potential to Achieve Goals: Fair    Frequency Min 1X/week     Co-evaluation               AM-PAC PT "6 Clicks" Mobility  Outcome Measure Help needed turning from your back to your side while in a flat bed without using bedrails?: A Lot Help needed moving from lying on your back to sitting on the side of a flat bed without using bedrails?: Total Help needed moving to and from a bed to a chair (including a wheelchair)?: Total Help needed standing up from a chair using your arms (e.g., wheelchair or bedside chair)?: Total Help needed to walk in hospital room?: Total Help needed climbing 3-5 steps with a railing? : Total 6 Click Score: 7    End of Session   Activity Tolerance: Patient limited by fatigue Patient left: in bed;with call bell/phone within reach;with bed alarm set Nurse Communication: Mobility status;Need for lift equipment PT Visit Diagnosis: Unsteadiness on feet (R26.81);Muscle weakness (generalized) (M62.81);Difficulty in walking, not elsewhere classified (R26.2)    Time: 0865-7846 PT Time Calculation (min) (ACUTE  ONLY): 13 min   Charges:   PT Evaluation $PT Eval Moderate Complexity: 1 Mod   PT General Charges $$ ACUTE PT VISIT: 1 Visit         Tamala Ser PT 03/17/2023  Acute Rehabilitation Services  Office [phone]

## 2023-03-17 NOTE — Hospital Course (Addendum)
[name] [name] is an 89 yo female with PMH dermatomyositis on Plaquenil, chronic diastolic CHF, DM II, CKD 2 who presented with worsening weakness after recent discharge on 03/01/2023. At baseline, patient is a poor historian but has 24/7 care at home. She was recently hospitalized for COVID-19 infection and was treated with Paxlovid.  Per last DC summary, she is also mostly bedbound and requires assistance for transfers and her ADLs.  CXR performed on admission also was notable for right lower lobe opacity concerning for infiltrate.  She was started on Zosyn for coverage. Course was completed during hospitalization.

## 2023-03-18 DIAGNOSIS — B370 Candidal stomatitis: Secondary | ICD-10-CM

## 2023-03-18 DIAGNOSIS — J189 Pneumonia, unspecified organism: Secondary | ICD-10-CM | POA: Diagnosis not present

## 2023-03-18 LAB — BASIC METABOLIC PANEL
Anion gap: 9 (ref 5–15)
BUN: 19 mg/dL (ref 8–23)
CO2: 27 mmol/L (ref 22–32)
Calcium: 8.9 mg/dL (ref 8.9–10.3)
Chloride: 94 mmol/L — ABNORMAL LOW (ref 98–111)
Creatinine, Ser: 0.88 mg/dL (ref 0.44–1.00)
GFR, Estimated: >60 mL/min (ref >60)
Glucose, Bld: 220 mg/dL — ABNORMAL HIGH (ref 70–99)
Potassium: 3.7 mmol/L (ref 3.5–5.1)
Sodium: 130 mmol/L — ABNORMAL LOW (ref 135–145)

## 2023-03-18 LAB — CBC WITH DIFFERENTIAL/PLATELET
Abs Immature Granulocytes: 0.03 10*3/uL (ref 0.00–0.07)
Basophils Absolute: 0 10*3/uL (ref 0.0–0.1)
Basophils Relative: 0 %
Eosinophils Absolute: 0.4 10*3/uL (ref 0.0–0.5)
Eosinophils Relative: 5 %
HCT: 25.7 % — ABNORMAL LOW (ref 36.0–46.0)
Hemoglobin: 8.2 g/dL — ABNORMAL LOW (ref 12.0–15.0)
Immature Granulocytes: 0 %
Lymphocytes Relative: 17 %
Lymphs Abs: 1.1 10*3/uL (ref 0.7–4.0)
MCH: 32.2 pg (ref 26.0–34.0)
MCHC: 31.9 g/dL (ref 30.0–36.0)
MCV: 100.8 fL — ABNORMAL HIGH (ref 80.0–100.0)
Monocytes Absolute: 0.2 10*3/uL (ref 0.1–1.0)
Monocytes Relative: 3 %
Neutro Abs: 4.9 10*3/uL (ref 1.7–7.7)
Neutrophils Relative %: 75 %
Platelets: 211 10*3/uL (ref 150–400)
RBC: 2.55 MIL/uL — ABNORMAL LOW (ref 3.87–5.11)
RDW: 15 % (ref 11.5–15.5)
WBC: 6.7 10*3/uL (ref 4.0–10.5)
nRBC: 0 % (ref 0.0–0.2)

## 2023-03-18 LAB — GLUCOSE, CAPILLARY
Glucose-Capillary: 112 mg/dL — ABNORMAL HIGH (ref 70–99)
Glucose-Capillary: 59 mg/dL — ABNORMAL LOW (ref 70–99)
Glucose-Capillary: 312 mg/dL — ABNORMAL HIGH (ref 70–99)
Glucose-Capillary: 382 mg/dL — ABNORMAL HIGH (ref 70–99)
Glucose-Capillary: 328 mg/dL — ABNORMAL HIGH (ref 70–99)
Glucose-Capillary: 417 mg/dL — ABNORMAL HIGH (ref 70–99)
Glucose-Capillary: 390 mg/dL — ABNORMAL HIGH (ref 70–99)
Glucose-Capillary: 167 mg/dL — ABNORMAL HIGH (ref 70–99)

## 2023-03-18 LAB — PROCALCITONIN: Procalcitonin: 0.43 ng/mL

## 2023-03-18 LAB — MAGNESIUM: Magnesium: 1.7 mg/dL (ref 1.7–2.4)

## 2023-03-18 MED ORDER — FAMOTIDINE 20 MG PO TABS
20.0000 mg | ORAL_TABLET | Freq: Every day | ORAL | Status: DC
  Administered 2023-03-18 – 2023-03-19 (×2): 20 mg via ORAL
  Filled 2023-03-18 (×2): qty 1

## 2023-03-18 MED ORDER — NYSTATIN 100000 UNIT/ML MT SUSP
5.0000 mL | Freq: Four times a day (QID) | OROMUCOSAL | Status: DC
  Administered 2023-03-18 – 2023-03-20 (×10): 500000 [IU] via ORAL
  Filled 2023-03-18 (×10): qty 5

## 2023-03-18 MED ORDER — FOLIC ACID 1 MG PO TABS
1.0000 mg | ORAL_TABLET | Freq: Every day | ORAL | Status: DC
  Administered 2023-03-18 – 2023-03-19 (×2): 1 mg via ORAL
  Filled 2023-03-18 (×2): qty 1

## 2023-03-18 MED ORDER — INSULIN ASPART 100 UNIT/ML IJ SOLN
8.0000 [IU] | Freq: Once | INTRAMUSCULAR | Status: AC
  Administered 2023-03-18: 8 [IU] via SUBCUTANEOUS

## 2023-03-18 MED ORDER — INSULIN DETEMIR 100 UNIT/ML ~~LOC~~ SOLN
7.0000 [IU] | Freq: Every day | SUBCUTANEOUS | Status: DC
  Administered 2023-03-19 – 2023-03-20 (×2): 7 [IU] via SUBCUTANEOUS
  Filled 2023-03-18 (×3): qty 0.07

## 2023-03-18 MED ORDER — PREDNISONE 5 MG PO TABS
5.0000 mg | ORAL_TABLET | Freq: Every day | ORAL | Status: DC
  Administered 2023-03-19 – 2023-03-20 (×2): 5 mg via ORAL
  Filled 2023-03-18 (×2): qty 1

## 2023-03-18 MED ORDER — INSULIN ASPART 100 UNIT/ML IJ SOLN
0.0000 [IU] | INTRAMUSCULAR | Status: DC
  Administered 2023-03-18: 5 [IU] via SUBCUTANEOUS
  Administered 2023-03-18: 4 [IU] via SUBCUTANEOUS
  Administered 2023-03-18: 5 [IU] via SUBCUTANEOUS
  Administered 2023-03-19: 4 [IU] via SUBCUTANEOUS
  Administered 2023-03-19: 2 [IU] via SUBCUTANEOUS
  Administered 2023-03-19: 5 [IU] via SUBCUTANEOUS

## 2023-03-18 MED ORDER — FLUCONAZOLE 100 MG PO TABS
100.0000 mg | ORAL_TABLET | Freq: Every day | ORAL | Status: DC
  Administered 2023-03-19 – 2023-03-20 (×2): 100 mg via ORAL
  Filled 2023-03-18 (×2): qty 1

## 2023-03-18 MED ORDER — FLUCONAZOLE 200 MG PO TABS
200.0000 mg | ORAL_TABLET | Freq: Once | ORAL | Status: AC
  Administered 2023-03-18: 200 mg via ORAL
  Filled 2023-03-18: qty 1

## 2023-03-18 NOTE — Plan of Care (Signed)
  Problem: Education: Goal: Knowledge of risk factors and measures for prevention of condition will improve Outcome: Progressing   Problem: Respiratory: Goal: Complications related to the disease process, condition or treatment will be avoided or minimized Outcome: Progressing   Problem: Health Behavior/Discharge Planning: Goal: Ability to manage health-related needs will improve Outcome: Progressing   Problem: Clinical Measurements: Goal: Respiratory complications will improve Outcome: Progressing   Problem: Nutrition: Goal: Adequate nutrition will be maintained Outcome: Progressing   Problem: Skin Integrity: Goal: Risk for impaired skin integrity will decrease Outcome: Progressing

## 2023-03-18 NOTE — Plan of Care (Signed)
  Problem: Respiratory: Goal: Will maintain a patent airway Outcome: Progressing Goal: Complications related to the disease process, condition or treatment will be avoided or minimized Outcome: Progressing   Problem: Education: Goal: Knowledge of General Education information will improve Description: Including pain rating scale, medication(s)/side effects and non-pharmacologic comfort measures Outcome: Progressing   Problem: Health Behavior/Discharge Planning: Goal: Ability to manage health-related needs will improve Outcome: Progressing   Problem: Clinical Measurements: Goal: Will remain free from infection Outcome: Progressing Goal: Respiratory complications will improve Outcome: Progressing   Problem: Pain Managment: Goal: General experience of comfort will improve Outcome: Progressing

## 2023-03-18 NOTE — Consult Note (Signed)
   [facility]  [facility] Inpatient Consult   03/18/2023  [name] [name] 06-Jan-1934 [id number]  [facility] [[facility]]  Accountable Care Organization [ACO] Patient:  [name] [name] insurance  [facility] Liaison remote coverage review for patient admitted to [facility]  Primary Care Provider:  [name], MD with Debor[facility] Sprang at [place] is listed to provide the transition of care follow up calls and appointments  Patient screened for less than 30 days readmission with 5 hospitalizations and 3 ED visits in [facility] in the past 6 months with noted extreme high score for unplanned readmission risk length of stay and for [facility] transition team for post hospital transition for care coordination needs.  Review of patient's electronic medical record for readmission progress notes, inpatient [facility] team notes reveals patient is for home with home health with daughter, [name] [name] who is listed as patient's legal guardian.   Plan:  Continue to follow progress and disposition to assess for post hospital community care coordination/management needs or changes in post hospital plan.    Referral request for community care coordination: pending disposition needs  anticipate the [facility] provider team [facility] for follow up  Of note, [facility] Care Management/Population Health does not replace or interfere with any arrangements made by the Inpatient Transition of Care team.  For questions contact:   Charlesetta Shanks, RN BSN CCM [facility]  [phone] business mobile phone Toll free office [phone]  Fax number: [phone] [email or web address]

## 2023-03-18 NOTE — Progress Notes (Signed)
Progress Note    LUDDIE GERHOLD   WGN:[id number]  DOB: 1933-11-04  DOA: 03/16/2023     2 PCP: [name], MD  Initial CC: Weakness  Hospital Course: Ms. Patty is an 89 yo female with PMH dermatomyositis on Plaquenil, chronic diastolic CHF, DM II, CKD 2 who presented with worsening weakness after recent discharge on 03/01/2023. At baseline, patient is a poor historian but has 24/7 care at home. She was recently hospitalized for COVID-19 infection and was treated with Paxlovid.  Per last DC summary, she is also mostly bedbound and requires assistance for transfers and her ADLs.  CXR performed on admission also was notable for right lower lobe opacity concerning for infiltrate.  She was started on Zosyn for coverage.  Interval History:  No events overnight.  Daughter present bedside this morning.  Plan of care discussed.  Plan is for continuing antibiotics in the hospital until course completed as they are concerned about her weakness and returning home too soon.  Assessment and Plan:  Weakness CAP Recent COVID-19 infection -Initially tested positive 02/24/2023; may be having some waxing/waning of weakness associated with infection plus still suspect some worsened conditioning from hospitalization  - continue supportive care - continue [facility] PT/OT at discharge  - possible RLL infiltrate on CXR; PCT mildly elevated as well; WBC elevated on admission also - continue zosyn; will plan on 5 day course inpatient   Recurrent oral thrush Was started on Diflucan and nystatin. Continue both   Hypovolemic hyponatremia Resolved with IV fluids   Uncontrolled diabetes mellitus type 2 With a last A1c of 8.4 No changes made to her medication   Hx orthostasis No change made to her medication.   Hypothyroidism continue Synthroid.   Dermatomyositis -Continue prednisone and Plaquenil    Moderate protein caloric malnutrition Continue Ensure 3 times daily  History of seizure Continue  Keppra   Peripheral artery disease Continue aspirin and Plavix   Stage 1 pressure ulcer - noted    Old records reviewed in assessment of this patient  Antimicrobials: Zosyn 03/16/2023 >> current  DVT prophylaxis:  enoxaparin (LOVENOX) injection 40 mg Start: 03/16/23 2200 SCDs Start: 03/16/23 2047   Code Status:   Code Status: DNR  Mobility Assessment (Last 72 Hours)     Mobility Assessment     Row Name 03/17/23 2300 03/17/23 1046 03/17/23 1034 03/16/23 2300     Does patient have an order for bedrest or is patient medically unstable No - Continue assessment -- No - Continue assessment No - Continue assessment    What is the highest level of mobility based on the progressive mobility assessment? Level 1 (Bedfast) - Unable to balance while sitting on edge of bed Level 1 (Bedfast) - Unable to balance while sitting on edge of bed Level 1 (Bedfast) - Unable to balance while sitting on edge of bed Level 2 (Chairfast) - Balance while sitting on edge of bed and cannot stand    Is the above level different from baseline mobility prior to current illness? Yes - Recommend PT order -- Yes - Recommend PT order Yes - Recommend PT order             Barriers to discharge: none Disposition Plan:  Home with HH Status is: Inpt  Objective: Blood pressure 129/64, pulse 72, temperature 97.9 F (36.6 C), temperature source Oral, resp. rate 16, SpO2 100%.  Examination:  Physical Exam Constitutional:      Comments: Chronically ill appearing but NAD  HENT:     Head: Normocephalic and atraumatic.     Mouth/Throat:     Mouth: Mucous membranes are dry.  Eyes:     Extraocular Movements: Extraocular movements intact.  Cardiovascular:     Rate and Rhythm: Normal rate and regular rhythm.  Pulmonary:     Effort: Pulmonary effort is normal. No respiratory distress.     Breath sounds: Normal breath sounds. No wheezing.  Abdominal:     General: Bowel sounds are normal. There is no distension.      Palpations: Abdomen is soft.     Tenderness: There is no abdominal tenderness.  Musculoskeletal:        General: Normal range of motion.     Cervical back: Normal range of motion and neck supple.  Skin:    General: Skin is warm.  Neurological:     Mental Status: She is disoriented.     Comments: Follows commands and moves all 4 extremities      Consultants:    Procedures:    Data Reviewed: Results for orders placed or performed during the hospital encounter of 03/16/23 (from the past 24 hour(s))  Glucose, capillary     Status: Abnormal   Collection Time: 03/17/23  4:47 PM  Result Value Ref Range   Glucose-Capillary 257 (H) 70 - 99 mg/dL  Glucose, capillary     Status: Abnormal   Collection Time: 03/17/23  8:25 PM  Result Value Ref Range   Glucose-Capillary 256 (H) 70 - 99 mg/dL  Glucose, capillary     Status: Abnormal   Collection Time: 03/18/23  2:42 AM  Result Value Ref Range   Glucose-Capillary 59 (L) 70 - 99 mg/dL  Glucose, capillary     Status: Abnormal   Collection Time: 03/18/23  3:04 AM  Result Value Ref Range   Glucose-Capillary 112 (H) 70 - 99 mg/dL   Comment 1 Document in Chart   Procalcitonin     Status: None   Collection Time: 03/18/23  5:27 AM  Result Value Ref Range   Procalcitonin 0.43 ng/mL  Basic metabolic panel     Status: Abnormal   Collection Time: 03/18/23  5:27 AM  Result Value Ref Range   Sodium 130 (L) 135 - 145 mmol/L   Potassium 3.7 3.5 - 5.1 mmol/L   Chloride 94 (L) 98 - 111 mmol/L   CO2 27 22 - 32 mmol/L   Glucose, Bld 220 (H) 70 - 99 mg/dL   BUN 19 8 - 23 mg/dL   Creatinine, Ser 1.61 0.44 - 1.00 mg/dL   Calcium 8.9 8.9 - 09.6 mg/dL   GFR, Estimated >04 >54 mL/min   Anion gap 9 5 - 15  CBC with Differential/Platelet     Status: Abnormal   Collection Time: 03/18/23  5:27 AM  Result Value Ref Range   WBC 6.7 4.0 - 10.5 K/uL   RBC 2.55 (L) 3.87 - 5.11 MIL/uL   Hemoglobin 8.2 (L) 12.0 - 15.0 g/dL   HCT 09.8 (L) 11.9 - 14.7 %    MCV 100.8 (H) 80.0 - 100.0 fL   MCH 32.2 26.0 - 34.0 pg   MCHC 31.9 30.0 - 36.0 g/dL   RDW 82.9 56.2 - 13.0 %   Platelets 211 150 - 400 K/uL   nRBC 0.0 0.0 - 0.2 %   Neutrophils Relative % 75 %   Neutro Abs 4.9 1.7 - 7.7 K/uL   Lymphocytes Relative 17 %   Lymphs Abs 1.1 0.7 -  4.0 K/uL   Monocytes Relative 3 %   Monocytes Absolute 0.2 0.1 - 1.0 K/uL   Eosinophils Relative 5 %   Eosinophils Absolute 0.4 0.0 - 0.5 K/uL   Basophils Relative 0 %   Basophils Absolute 0.0 0.0 - 0.1 K/uL   Immature Granulocytes 0 %   Abs Immature Granulocytes 0.03 0.00 - 0.07 K/uL  Magnesium     Status: None   Collection Time: 03/18/23  5:27 AM  Result Value Ref Range   Magnesium 1.7 1.7 - 2.4 mg/dL  Glucose, capillary     Status: Abnormal   Collection Time: 03/18/23  7:04 AM  Result Value Ref Range   Glucose-Capillary 312 (H) 70 - 99 mg/dL  Glucose, capillary     Status: Abnormal   Collection Time: 03/18/23  8:55 AM  Result Value Ref Range   Glucose-Capillary 382 (H) 70 - 99 mg/dL  Glucose, capillary     Status: Abnormal   Collection Time: 03/18/23  1:05 PM  Result Value Ref Range   Glucose-Capillary 328 (H) 70 - 99 mg/dL    I have reviewed pertinent nursing notes, vitals, labs, and images as necessary. I have ordered labwork to follow up on as indicated.  I have reviewed the last notes from staff over past 24 hours. I have discussed patient's care plan and test results with nursing staff, CM/SW, and other staff as appropriate.  Time spent: Greater than 50% of the 55 minute visit was spent in counseling/coordination of care for the patient as laid out in the A&P.   LOS: 2 days   Lewie Chamber, MD [facility] 03/18/2023, 4:14 PM

## 2023-03-19 DIAGNOSIS — J189 Pneumonia, unspecified organism: Secondary | ICD-10-CM | POA: Diagnosis not present

## 2023-03-19 LAB — CBC WITH DIFFERENTIAL/PLATELET
Abs Immature Granulocytes: 0.03 10*3/uL (ref 0.00–0.07)
Basophils Absolute: 0 10*3/uL (ref 0.0–0.1)
Basophils Relative: 0 %
Eosinophils Absolute: 0.4 10*3/uL (ref 0.0–0.5)
Eosinophils Relative: 5 %
HCT: 24.9 % — ABNORMAL LOW (ref 36.0–46.0)
Hemoglobin: 8.1 g/dL — ABNORMAL LOW (ref 12.0–15.0)
Immature Granulocytes: 0 %
Lymphocytes Relative: 18 %
Lymphs Abs: 1.3 10*3/uL (ref 0.7–4.0)
MCH: 32.8 pg (ref 26.0–34.0)
MCHC: 32.5 g/dL (ref 30.0–36.0)
MCV: 100.8 fL — ABNORMAL HIGH (ref 80.0–100.0)
Monocytes Absolute: 0.2 10*3/uL (ref 0.1–1.0)
Monocytes Relative: 3 %
Neutro Abs: 5.1 10*3/uL (ref 1.7–7.7)
Neutrophils Relative %: 74 %
Platelets: 219 10*3/uL (ref 150–400)
RBC: 2.47 MIL/uL — ABNORMAL LOW (ref 3.87–5.11)
RDW: 15.1 % (ref 11.5–15.5)
WBC: 7.1 10*3/uL (ref 4.0–10.5)
nRBC: 0 % (ref 0.0–0.2)

## 2023-03-19 LAB — GLUCOSE, CAPILLARY
Glucose-Capillary: 359 mg/dL — ABNORMAL HIGH (ref 70–99)
Glucose-Capillary: 221 mg/dL — ABNORMAL HIGH (ref 70–99)
Glucose-Capillary: 329 mg/dL — ABNORMAL HIGH (ref 70–99)
Glucose-Capillary: 355 mg/dL — ABNORMAL HIGH (ref 70–99)
Glucose-Capillary: 341 mg/dL — ABNORMAL HIGH (ref 70–99)
Glucose-Capillary: 271 mg/dL — ABNORMAL HIGH (ref 70–99)
Glucose-Capillary: 199 mg/dL — ABNORMAL HIGH (ref 70–99)

## 2023-03-19 LAB — BASIC METABOLIC PANEL
Anion gap: 8 (ref 5–15)
BUN: 21 mg/dL (ref 8–23)
CO2: 27 mmol/L (ref 22–32)
Calcium: 9 mg/dL (ref 8.9–10.3)
Chloride: 97 mmol/L — ABNORMAL LOW (ref 98–111)
Creatinine, Ser: 0.91 mg/dL (ref 0.44–1.00)
GFR, Estimated: >60 mL/min (ref >60)
Glucose, Bld: 313 mg/dL — ABNORMAL HIGH (ref 70–99)
Potassium: 3.4 mmol/L — ABNORMAL LOW (ref 3.5–5.1)
Sodium: 132 mmol/L — ABNORMAL LOW (ref 135–145)

## 2023-03-19 LAB — MAGNESIUM: Magnesium: 1.8 mg/dL (ref 1.7–2.4)

## 2023-03-19 MED ORDER — INSULIN ASPART 100 UNIT/ML IJ SOLN
0.0000 [IU] | Freq: Three times a day (TID) | INTRAMUSCULAR | Status: DC
  Administered 2023-03-19 – 2023-03-20 (×2): 7 [IU] via SUBCUTANEOUS

## 2023-03-19 MED ORDER — INSULIN DETEMIR 100 UNIT/ML ~~LOC~~ SOLN
3.0000 [IU] | Freq: Every evening | SUBCUTANEOUS | Status: DC
  Administered 2023-03-19: 3 [IU] via SUBCUTANEOUS
  Filled 2023-03-19 (×2): qty 0.03

## 2023-03-19 NOTE — Plan of Care (Signed)
  Problem: Education: Goal: Knowledge of risk factors and measures for prevention of condition will improve Outcome: Progressing   Problem: Coping: Goal: Psychosocial and spiritual needs will be supported Outcome: Progressing   Problem: Clinical Measurements: Goal: Diagnostic test results will improve Outcome: Progressing   Problem: Nutrition: Goal: Adequate nutrition will be maintained Outcome: Progressing

## 2023-03-19 NOTE — Progress Notes (Signed)
Physical Therapy Treatment Patient Details Name: ZYKIA SUTKOWSKI MRN: [id number] DOB: 05-12-1934 Today's Date: 03/19/2023   History of Present Illness 89 y.o. female with medical history significant for dermatomyositis, chronic diastolic congestive heart failure, insulin-dependent type 2 diabetes, CKD stage II, who had recent hospital stays for aspiration pneumonia as well as COVID and weakness discharged 8/12 who is being admitted to the hospital with complaints of weakness and concern for healthcare acquired pneumonia on 03/16/23.    PT Comments  Pt agreeable to therapy and eager to get OOB.  She was able to follow commands today.  Pt requiring mod A for supine/sit and to stand in STEDY for transfer.  Did have assist of 2 available for safety.  Pt tolerated well.  Noted family has adamantly declined SNF as they are able to care for pt at home. Daughter in at end of session and reports they have necessary equipment and are able to stand pivot the patient as needed.     If plan is discharge home, recommend the following: A lot of help with bathing/dressing/bathroom;Assistance with cooking/housework;Direct supervision/assist for medications management;Direct supervision/assist for financial management;Assist for transportation;Help with stairs or ramp for entrance;Supervision due to cognitive status;A lot of help with walking and/or transfers   Can travel by private vehicle     No  Equipment Recommendations  None recommended by PT;Other (comment) (spoke with dtr who reports they have necessary DME, does not feel they need lift equipment at this time)    Recommendations for Other Services       Precautions / Restrictions Precautions Precautions: Fall     Mobility  Bed Mobility Overal bed mobility: Needs Assistance Bed Mobility: Supine to Sit     Supine to sit: Mod assist, +2 for safety/equipment     General bed mobility comments: Increased time and multimodal cues.  Pt requiring min A  for legs to EOB then mod A to lift trunk.    Transfers Overall transfer level: Needs assistance Equipment used: Ambulation equipment used Transfers: Sit to/from Stand, Bed to chair/wheelchair/BSC Sit to Stand: Mod assist, +2 safety/equipment Stand pivot transfers: Total assist         General transfer comment: Pt stood from bed x 1 and STEDY x 2.  Had assist of 2 for safety but only needing mod A and cues for hand placement (pt pulling up on STEDY); STEDY for pivot Transfer via Lift Equipment: Stedy  Ambulation/Gait               General Gait Details: non ambulatory baseline   Stairs             Wheelchair Mobility     Tilt Bed    Modified Rankin (Stroke Patients Only)       Balance Overall balance assessment: Needs assistance Sitting-balance support: Bilateral upper extremity supported Sitting balance-Leahy Scale: Poor Sitting balance - Comments: Pt initially with posterior and L lean today requiring mod A.  With cues to reach forward and assist to lean trunk forward able to progress to SBA for 2-3 mins but with fatigue needing min A.     Standing balance-Leahy Scale: Poor Standing balance comment: Stood in STEDY x 2 with min A to balance                            Cognition Arousal: Alert Behavior During Therapy: WFL for tasks assessed/performed Overall Cognitive Status: No family/caregiver present to determine baseline cognitive  functioning                                 General Comments: Pt pleasant, eager to get OOB, oriented x 2 (self/place), and follows commands        Exercises General Exercises - Lower Extremity Ankle Circles/Pumps: AROM, 20 reps, Supine Short Arc Quad: AROM, Both, 10 reps Long Arc Quad: AROM, Both, 20 reps Other Exercises Other Exercises: cues for controlled full ROM    General Comments General comments (skin integrity, edema, etc.): VSS      Pertinent Vitals/Pain Pain Assessment Pain  Assessment: No/denies pain    Home Living                          Prior Function            PT Goals (current goals can now be found in the care plan section) Progress towards PT goals: Progressing toward goals    Frequency    Min 1X/week      PT Plan      Co-evaluation              AM-PAC PT "6 Clicks" Mobility   Outcome Measure  Help needed turning from your back to your side while in a flat bed without using bedrails?: A Lot Help needed moving from lying on your back to sitting on the side of a flat bed without using bedrails?: A Lot Help needed moving to and from a bed to a chair (including a wheelchair)?: Total Help needed standing up from a chair using your arms (e.g., wheelchair or bedside chair)?: A Lot Help needed to walk in hospital room?: Total Help needed climbing 3-5 steps with a railing? : Total 6 Click Score: 9    End of Session Equipment Utilized During Treatment: Gait belt Activity Tolerance: Patient limited by fatigue Patient left: with call bell/phone within reach;with chair alarm set;in chair;Other (comment) (feet elevated and pillows to support) Nurse Communication: Mobility status;Need for lift equipment;Other (comment) (Recommend STEDY with assist of 2 for safety; recommended not leaving up for more than 2 hr b/c of fatigue (~1430 for back to bed)) PT Visit Diagnosis: Unsteadiness on feet (R26.81);Muscle weakness (generalized) (M62.81);Difficulty in walking, not elsewhere classified (R26.2)     Time: 1610-9604 PT Time Calculation (min) (ACUTE ONLY): 23 min  Charges:    $Therapeutic Exercise: 8-22 mins $Therapeutic Activity: 8-22 mins PT General Charges $$ ACUTE PT VISIT: 1 Visit                     Anise Salvo, PT Acute Rehab Services [facility] [phone]    Rayetta Humphrey 03/19/2023, 1:25 PM

## 2023-03-19 NOTE — Progress Notes (Signed)
Progress Note    BLANCH BLOK   NWG:[id number]  DOB: 10/29/1933  DOA: 03/16/2023     3 PCP: [name], MD  Initial CC: Weakness  Hospital Course: Ms. Prokosch is an 89 yo female with PMH dermatomyositis on Plaquenil, chronic diastolic CHF, DM II, CKD 2 who presented with worsening weakness after recent discharge on 03/01/2023. At baseline, patient is a poor historian but has 24/7 care at home. She was recently hospitalized for COVID-19 infection and was treated with Paxlovid.  Per last DC summary, she is also mostly bedbound and requires assistance for transfers and her ADLs.  CXR performed on admission also was notable for right lower lobe opacity concerning for infiltrate.  She was started on Zosyn for coverage.  Interval History:  No events overnight.  Patient eating lunch when seen today with daughter bedside.  Patient was also seen earlier in the morning but very sleepy and did not awaken much. Continues to improve and plan is for discharging home after antibiotics completed in the hospital tomorrow.  Assessment and Plan:  Weakness CAP Recent COVID-19 infection -Initially tested positive 02/24/2023; may be having some waxing/waning of weakness associated with infection plus still suspect some worsened conditioning from hospitalization  - continue supportive care - continue [facility] PT/OT at discharge  - possible RLL infiltrate on CXR; PCT mildly elevated as well; WBC elevated on admission also - continue zosyn; will plan on 5 day course inpatient; to be completed on 8/31  Recurrent oral thrush Was started on Diflucan and nystatin. Continue both   Hypovolemic hyponatremia Resolved with IV fluids   Uncontrolled diabetes mellitus type 2 With a last A1c of 8.4 No changes made to her medication   Hx orthostasis No change made to her medication.   Hypothyroidism continue Synthroid.   Dermatomyositis -Continue prednisone and Plaquenil    Moderate protein caloric  malnutrition Continue Ensure 3 times daily  History of seizure Continue Keppra   Peripheral artery disease Continue aspirin and Plavix   Stage 1 pressure ulcer - noted    Old records reviewed in assessment of this patient  Antimicrobials: Zosyn 03/16/2023 >> current  DVT prophylaxis:  enoxaparin (LOVENOX) injection 40 mg Start: 03/16/23 2200 SCDs Start: 03/16/23 2047   Code Status:   Code Status: DNR  Mobility Assessment (Last 72 Hours)     Mobility Assessment     Row Name 03/19/23 1322 03/18/23 2018 03/18/23 0841 03/17/23 2300 03/17/23 1046   Does patient have an order for bedrest or is patient medically unstable -- No - Continue assessment No - Continue assessment No - Continue assessment --   What is the highest level of mobility based on the progressive mobility assessment? Level 2 (Chairfast) - Balance while sitting on edge of bed and cannot stand  STEDY tx Level 1 (Bedfast) - Unable to balance while sitting on edge of bed Level 1 (Bedfast) - Unable to balance while sitting on edge of bed Level 1 (Bedfast) - Unable to balance while sitting on edge of bed Level 1 (Bedfast) - Unable to balance while sitting on edge of bed   Is the above level different from baseline mobility prior to current illness? -- Yes - Recommend PT order Yes - Recommend PT order Yes - Recommend PT order --    Row Name 03/17/23 1034 03/16/23 2300         Does patient have an order for bedrest or is patient medically unstable No - Continue assessment No -  Continue assessment      What is the highest level of mobility based on the progressive mobility assessment? Level 1 (Bedfast) - Unable to balance while sitting on edge of bed Level 2 (Chairfast) - Balance while sitting on edge of bed and cannot stand      Is the above level different from baseline mobility prior to current illness? Yes - Recommend PT order Yes - Recommend PT order               Barriers to discharge: none Disposition Plan:   Home with HH Status is: Inpt  Objective: Blood pressure (!) 148/66, pulse 67, temperature 98.2 F (36.8 C), temperature source Oral, resp. rate 18, SpO2 99%.  Examination:  Physical Exam Constitutional:      Comments: Chronically ill appearing but NAD  HENT:     Head: Normocephalic and atraumatic.     Mouth/Throat:     Mouth: Mucous membranes are moist.  Eyes:     Extraocular Movements: Extraocular movements intact.  Cardiovascular:     Rate and Rhythm: Normal rate and regular rhythm.  Pulmonary:     Effort: Pulmonary effort is normal. No respiratory distress.     Breath sounds: Normal breath sounds. No wheezing.  Abdominal:     General: Bowel sounds are normal. There is no distension.     Palpations: Abdomen is soft.     Tenderness: There is no abdominal tenderness.  Musculoskeletal:        General: Normal range of motion.     Cervical back: Normal range of motion and neck supple.  Skin:    General: Skin is warm.  Neurological:     Mental Status: Mental status is at baseline.     Comments: Follows commands and moves all 4 extremities      Consultants:    Procedures:    Data Reviewed: Results for orders placed or performed during the hospital encounter of 03/16/23 (from the past 24 hour(s))  Glucose, capillary     Status: Abnormal   Collection Time: 03/18/23  5:14 PM  Result Value Ref Range   Glucose-Capillary 417 (H) 70 - 99 mg/dL  Glucose, capillary     Status: Abnormal   Collection Time: 03/18/23  7:42 PM  Result Value Ref Range   Glucose-Capillary 390 (H) 70 - 99 mg/dL  Glucose, capillary     Status: Abnormal   Collection Time: 03/18/23 11:27 PM  Result Value Ref Range   Glucose-Capillary 167 (H) 70 - 99 mg/dL  Glucose, capillary     Status: Abnormal   Collection Time: 03/19/23  4:28 AM  Result Value Ref Range   Glucose-Capillary 359 (H) 70 - 99 mg/dL  Basic metabolic panel     Status: Abnormal   Collection Time: 03/19/23  5:33 AM  Result Value Ref  Range   Sodium 132 (L) 135 - 145 mmol/L   Potassium 3.4 (L) 3.5 - 5.1 mmol/L   Chloride 97 (L) 98 - 111 mmol/L   CO2 27 22 - 32 mmol/L   Glucose, Bld 313 (H) 70 - 99 mg/dL   BUN 21 8 - 23 mg/dL   Creatinine, Ser 9.56 0.44 - 1.00 mg/dL   Calcium 9.0 8.9 - 21.3 mg/dL   GFR, Estimated >08 >65 mL/min   Anion gap 8 5 - 15  CBC with Differential/Platelet     Status: Abnormal   Collection Time: 03/19/23  5:33 AM  Result Value Ref Range   WBC 7.1 4.0 -  10.5 K/uL   RBC 2.47 (L) 3.87 - 5.11 MIL/uL   Hemoglobin 8.1 (L) 12.0 - 15.0 g/dL   HCT 28.4 (L) 13.2 - 44.0 %   MCV 100.8 (H) 80.0 - 100.0 fL   MCH 32.8 26.0 - 34.0 pg   MCHC 32.5 30.0 - 36.0 g/dL   RDW 10.2 72.5 - 36.6 %   Platelets 219 150 - 400 K/uL   nRBC 0.0 0.0 - 0.2 %   Neutrophils Relative % 74 %   Neutro Abs 5.1 1.7 - 7.7 K/uL   Lymphocytes Relative 18 %   Lymphs Abs 1.3 0.7 - 4.0 K/uL   Monocytes Relative 3 %   Monocytes Absolute 0.2 0.1 - 1.0 K/uL   Eosinophils Relative 5 %   Eosinophils Absolute 0.4 0.0 - 0.5 K/uL   Basophils Relative 0 %   Basophils Absolute 0.0 0.0 - 0.1 K/uL   Immature Granulocytes 0 %   Abs Immature Granulocytes 0.03 0.00 - 0.07 K/uL  Magnesium     Status: None   Collection Time: 03/19/23  5:33 AM  Result Value Ref Range   Magnesium 1.8 1.7 - 2.4 mg/dL  Glucose, capillary     Status: Abnormal   Collection Time: 03/19/23  7:13 AM  Result Value Ref Range   Glucose-Capillary 221 (H) 70 - 99 mg/dL  Glucose, capillary     Status: Abnormal   Collection Time: 03/19/23 12:00 PM  Result Value Ref Range   Glucose-Capillary 329 (H) 70 - 99 mg/dL    I have reviewed pertinent nursing notes, vitals, labs, and images as necessary. I have ordered labwork to follow up on as indicated.  I have reviewed the last notes from staff over past 24 hours. I have discussed patient's care plan and test results with nursing staff, CM/SW, and other staff as appropriate.    LOS: 3 days   Lewie Chamber, MD [facility] 03/19/2023, 3:10 PM

## 2023-03-19 NOTE — Care Management Important Message (Signed)
Important Message  Patient Details IM Letter given. Name: NISHELLE NEUKAM MRN: [id number] Date of Birth: 12-Aug-1933   Medicare Important Message Given:  Yes     Caren Macadam 03/19/2023, 2:52 PM

## 2023-03-20 DIAGNOSIS — J189 Pneumonia, unspecified organism: Secondary | ICD-10-CM | POA: Diagnosis not present

## 2023-03-20 DIAGNOSIS — B370 Candidal stomatitis: Secondary | ICD-10-CM | POA: Diagnosis not present

## 2023-03-20 LAB — GLUCOSE, CAPILLARY
Glucose-Capillary: 89 mg/dL (ref 70–99)
Glucose-Capillary: 339 mg/dL — ABNORMAL HIGH (ref 70–99)

## 2023-03-20 MED ORDER — FLUCONAZOLE 100 MG PO TABS: 100.0000 mg | ORAL_TABLET | Freq: Every day | ORAL | 0 refills | Status: AC

## 2023-03-20 NOTE — Discharge Summary (Signed)
Physician Discharge Summary   [name] [name] XBJ:[id number] DOB: Dec 11, 1933 DOA: 03/16/2023  PCP: [name], MD  Admit date: 03/16/2023 Discharge date: 03/20/2023   Admitted From: Home Disposition:  Home Discharging physician: [name] Chamber, MD Barriers to discharge: none  Recommendations at discharge: Family requests not to be recommended SNF in the future. Preference is home with family at discharges.   Home Health: PT/OT  Discharge Condition: stable CODE STATUS: DNR Diet recommendation:  Diet Orders (From admission, onward)     Start     Ordered   03/20/23 0000  Diet Carb Modified        03/20/23 1124   03/16/23 2047  Diet Carb Modified Fluid consistency: Thin; Room service appropriate? Yes  Diet effective now       Question Answer Comment  Diet-HS Snack? Nothing   Calorie Level Medium 1600-2000   Fluid consistency: Thin   Room service appropriate? Yes      03/16/23 2048            Hospital Course: [name] is an 89 yo female with PMH dermatomyositis on Plaquenil, chronic diastolic CHF, DM II, CKD 2 who presented with worsening weakness after recent discharge on 03/01/2023. At baseline, patient is a poor historian but has 24/7 care at home. She was recently hospitalized for COVID-19 infection and was treated with Paxlovid.  Per last DC summary, she is also mostly bedbound and requires assistance for transfers and her ADLs.  CXR performed on admission also was notable for right lower lobe opacity concerning for infiltrate.  She was started on Zosyn for coverage. Course was completed during hospitalization.   Assessment and Plan:  Weakness CAP Recent COVID-19 infection -Initially tested positive 02/24/2023; may be having some waxing/waning of weakness associated with infection plus still suspect some worsened conditioning from hospitalization  - continue supportive care - continue [facility] PT/OT at discharge  - possible RLL infiltrate on CXR; PCT mildly elevated  as well; WBC elevated on admission also - continue zosyn; will plan on 5 day course inpatient; to be completed on 8/31   Recurrent oral thrush Was started on Diflucan and nystatin -Continue course of Diflucan at discharge.  Thrush showed improvement during hospitalization   Hypovolemic hyponatremia Resolved with IV fluids   Uncontrolled diabetes mellitus type 2 With a last A1c of 8.4 No changes made to her medication   Hx orthostasis No change made to her medication.   Hypothyroidism continue Synthroid.   Dermatomyositis -Continue prednisone and Plaquenil    Moderate protein caloric malnutrition Continue Ensure 3 times daily   History of seizure Continue Keppra   Peripheral artery disease Continue aspirin and Plavix   Stage 1 pressure ulcer - noted     The patient's acute and chronic medical conditions were treated accordingly. On day of discharge, patient was felt deemed stable for discharge. Patient/family member advised to call PCP or come back to ER if needed.   Principal Diagnosis: CAP (community acquired pneumonia)  Discharge Diagnoses: Active Hospital Problems   Diagnosis Date Noted   CAP (community acquired pneumonia) 03/16/2023    Priority: 1.   Oral thrush 08/03/2007    Priority: 3.   History of seizure 10/08/2019    Priority: 5.   Hypertension with hx of orthostatic hypotension 02/14/2007    Priority: 8.   Hypothyroidism, acquired 01/26/2007    Priority: 9.   Dermatopolymyositis, unspecified, organ involvement unspecified (HCC) 09/13/2021    Priority: 10.   Dementia without behavioral disturbance (  HCC) 07/27/2022    Priority: 12.    Resolved Hospital Problems  No resolved problems to display.     Discharge Instructions     Diet Carb Modified   Complete by: As directed    Discharge wound care:   Complete by: As directed    Cleanse right and left great toe wounds with saline and pat dry. Apply santyl to wound bed.  Cover with saline  moistened gauze and wrap toe with gauze and tape daily.  Barrier cream to buttocks and sacral area twice daily and as needed for soilage   Increase activity slowly   Complete by: As directed       Allergies as of 03/20/2023       Reactions   Nsaids Other (See Comments)   No NSAIDS due to kidney function- ESPECIALLY ibuprofen or Aleve   Biaxin [clarithromycin] Hives   Statins Other (See Comments)   Muscle pain (myositis per daugher)   Bactrim [sulfamethoxazole-trimethoprim] Hives, Rash   Lisinopril Cough   Sulfa Antibiotics Hives, Rash, Other (See Comments)   NO sulfa-based meds!!        Medication List     TAKE these medications    acetaminophen 650 MG CR tablet Commonly known as: TYLENOL Take 650 mg by mouth every 8 (eight) hours as needed for pain.   Bayer Low Dose 81 MG tablet Generic drug: aspirin EC Take 81 mg by mouth in the morning. Swallow whole.   cetirizine 10 MG tablet Commonly known as: ZYRTEC Take 10 mg by mouth 3 (three) times daily as needed for allergies or rhinitis.   clopidogrel 75 MG tablet Commonly known as: PLAVIX Take 1 tablet (75 mg total) by mouth daily with breakfast.   collagenase 250 UNIT/GM ointment Commonly known as: SANTYL Apply 1 Application topically See admin instructions. Applied to right great toe and bunion on the left foot daily. Bandage loosely.   ezetimibe 10 MG tablet Commonly known as: Zetia Take 1 tablet (10 mg total) by mouth daily. What changed: when to take this   famotidine 20 MG tablet Commonly known as: PEPCID Take 20 mg by mouth at bedtime.   ferrous sulfate 220 (44 Fe) MG/5ML solution Take 330 mg by mouth 3 (three) times a week.   fluconazole 100 MG tablet Commonly known as: DIFLUCAN Take 1 tablet (100 mg total) by mouth daily for 5 days. Start taking on: March 21, 2023 What changed:  medication strength how much to take   folic acid 1 MG tablet Commonly known as: FOLVITE Take 1 mg by mouth at  bedtime.   Levemir FlexTouch 100 UNIT/ML FlexTouch Pen Generic drug: insulin detemir Inject 3-7 Units into the skin See admin instructions. Inject 7 units into the skin in the morning and 3 units at bedtime   levETIRAcetam 500 MG tablet Commonly known as: KEPPRA Take 500 mg by mouth in the morning and at bedtime.   liver oil-zinc oxide 40 % ointment Commonly known as: DESITIN Apply topically 3 (three) times daily. What changed:  how much to take when to take this reasons to take this   methotrexate 2.5 MG tablet Commonly known as: RHEUMATREX Take 1 tablet (2.5 mg total) by mouth once a week. Caution:Chemotherapy. Protect from light. What changed:  how much to take when to take this   metoprolol tartrate 25 MG tablet Commonly known as: LOPRESSOR Take 1 tablet (25 mg total) by mouth 2 (two) times daily. What changed:  how much to take  when to take this reasons to take this   midodrine 5 MG tablet Commonly known as: PROAMATINE Take 0.5 tablets (2.5 mg total) by mouth 3 (three) times daily with meals.   multivitamin with minerals Tabs tablet Take 1 tablet by mouth daily with breakfast.   NovoLOG FlexPen 100 UNIT/ML FlexPen Generic drug: insulin aspart Inject 2-10 Units into the skin See admin instructions. Inject 2-10 units into the skin three times a day with meals, per sliding scale:  Breakfast: BGL 80-199 = 8 units; 200-299 = 9 units; 300 or greater = 10 units Lunch: BGL 80-199 = 5 units; 200-299 = 6 units; 300 or greater = 7 units Supper/evening meal: BGL 80-199 = 2 units; 200-299 = 3 units; 300 or greater = 4 units   Plaquenil 200 MG tablet Generic drug: hydroxychloroquine Take 200 mg by mouth at bedtime.   predniSONE 5 MG tablet Commonly known as: DELTASONE Take 5 mg by mouth daily with breakfast.   Synthroid 100 MCG tablet Generic drug: levothyroxine Take 100 mcg by mouth daily before breakfast. What changed: Another medication with the same name was  removed. Continue taking this medication, and follow the directions you see here.   triamcinolone ointment 0.1 % Commonly known as: KENALOG Apply 1 Application topically 2 (two) times daily as needed (skin irritation- affected areas).   VITAMIN D-3 PO Take 2,000 Units by mouth daily.               Discharge Care Instructions  (From admission, onward)           Start     Ordered   03/20/23 0000  Discharge wound care:       Comments: Cleanse right and left great toe wounds with saline and pat dry. Apply santyl to wound bed.  Cover with saline moistened gauze and wrap toe with gauze and tape daily.  Barrier cream to buttocks and sacral area twice daily and as needed for soilage   03/20/23 1124            Allergies  Allergen Reactions   Nsaids Other (See Comments)    No NSAIDS due to kidney function- ESPECIALLY ibuprofen or Aleve   Biaxin [Clarithromycin] Hives   Statins Other (See Comments)    Muscle pain (myositis per daugher)   Bactrim [Sulfamethoxazole-Trimethoprim] Hives and Rash   Lisinopril Cough   Sulfa Antibiotics Hives, Rash and Other (See Comments)    NO sulfa-based meds!!     Consultations:   Procedures:   Discharge Exam: BP (!) 160/64 (BP Location: Right Arm)   Pulse (!) 59   Temp (!) 97.3 F (36.3 C) (Oral)   Resp 18   SpO2 100%  Physical Exam Constitutional:      Comments: Chronically ill appearing but NAD  HENT:     Head: Normocephalic and atraumatic.     Mouth/Throat:     Mouth: Mucous membranes are moist.  Eyes:     Extraocular Movements: Extraocular movements intact.  Cardiovascular:     Rate and Rhythm: Normal rate and regular rhythm.  Pulmonary:     Effort: Pulmonary effort is normal. No respiratory distress.     Breath sounds: Normal breath sounds. No wheezing.  Abdominal:     General: Bowel sounds are normal. There is no distension.     Palpations: Abdomen is soft.     Tenderness: There is no abdominal tenderness.   Musculoskeletal:        General: Normal range of motion.  Cervical back: Normal range of motion and neck supple.  Skin:    General: Skin is warm.  Neurological:     Mental Status: Mental status is at baseline.     Comments: Follows commands and moves all 4 extremities      The results of significant diagnostics from this hospitalization (including imaging, microbiology, ancillary and laboratory) are listed below for reference.   Microbiology: Recent Results (from the past 240 hour(s))  Blood culture (routine x 2)     Status: None (Preliminary result)   Collection Time: 03/16/23  6:06 PM   Specimen: BLOOD RIGHT HAND  Result Value Ref Range Status   Specimen Description   Final    BLOOD RIGHT HAND Performed at [facility], [address], [place], [place] [address]    Special Requests   Final    AEROBIC BOTTLE ONLY Blood Culture adequate volume Performed at [facility], [address] [place], [place] [address]    Culture   Final    NO GROWTH 4 DAYS Performed at [facility], [address] [place], [place] [address]    Report Status PENDING  Incomplete  Blood culture (routine x 2)     Status: None (Preliminary result)   Collection Time: 03/16/23  6:07 PM   Specimen: BLOOD LEFT FOREARM  Result Value Ref Range Status   Specimen Description   Final    BLOOD LEFT FOREARM Performed at [facility], [address] [place], [place] [address]    Special Requests   Final    BOTTLES DRAWN AEROBIC AND ANAEROBIC Blood Culture results may not be optimal due to an excessive volume of blood received in culture bottles Performed at [facility], [address] [place], [place] [address]    Culture   Final    NO GROWTH 4 DAYS Performed at [facility], [address], [place], [place] [address]    Report Status PENDING  Incomplete     Labs: BNP (last 3 results) Recent Labs     09/03/22 1230  BNP 123.0*   Basic Metabolic Panel: Recent Labs  Lab 03/16/23 1743 03/17/23 0537 03/18/23 0527 03/19/23 0533  NA 133* 133* 130* 132*  K 3.8 4.1 3.7 3.4*  CL 97* 96* 94* 97*  CO2 29 29 27 27   GLUCOSE 133* 220* 220* 313*  BUN 15 15 19 21   CREATININE 0.83 0.80 0.88 0.91  CALCIUM 9.4 9.3 8.9 9.0  MG  --   --  1.7 1.8   Liver Function Tests: Recent Labs  Lab 03/16/23 1743  AST 44*  ALT 45*  ALKPHOS 78  BILITOT 0.4  PROT 6.2*  ALBUMIN 2.9*   No results for input(s): "LIPASE", "AMYLASE" in the last 168 hours. No results for input(s): "AMMONIA" in the last 168 hours. CBC: Recent Labs  Lab 03/16/23 1743 03/17/23 0537 03/18/23 0527 03/19/23 0533  WBC 16.5* 11.3* 6.7 7.1  NEUTROABS 14.6*  --  4.9 5.1  HGB 9.0* 8.2* 8.2* 8.1*  HCT 27.5* 25.6* 25.7* 24.9*  MCV 100.7* 102.4* 100.8* 100.8*  PLT 251 238 211 219   Cardiac Enzymes: No results for input(s): "CKTOTAL", "CKMB", "CKMBINDEX", "TROPONINI" in the last 168 hours. BNP: Invalid input(s): "POCBNP" CBG: Recent Labs  Lab 03/19/23 1734 03/19/23 2027 03/19/23 2203 03/20/23 0708 03/20/23 1144  GLUCAP 341* 271* 199* 89 339*   D-Dimer No results for input(s): "DDIMER" in the last 72 hours. Hgb  A1c No results for input(s): "HGBA1C" in the last 72 hours. Lipid Profile No results for input(s): "CHOL", "HDL", "LDLCALC", "TRIG", "CHOLHDL", "LDLDIRECT" in the last 72 hours. Thyroid function studies No results for input(s): "TSH", "T4TOTAL", "T3FREE", "THYROIDAB" in the last 72 hours.  Invalid input(s): "FREET3" Anemia work up No results for input(s): "VITAMINB12", "FOLATE", "FERRITIN", "TIBC", "IRON", "RETICCTPCT" in the last 72 hours. Urinalysis    Component Value Date/Time   COLORURINE YELLOW 03/16/2023 2036   APPEARANCEUR CLEAR 03/16/2023 2036   LABSPEC 1.008 03/16/2023 2036   PHURINE 7.0 03/16/2023 2036   GLUCOSEU 50 (A) 03/16/2023 2036   HGBUR NEGATIVE 03/16/2023 2036   BILIRUBINUR  NEGATIVE 03/16/2023 2036   BILIRUBINUR neg 05/06/2015 1421   KETONESUR NEGATIVE 03/16/2023 2036   PROTEINUR NEGATIVE 03/16/2023 2036   UROBILINOGEN 1.0 05/06/2015 1421   UROBILINOGEN 0.2 12/25/2014 1206   NITRITE NEGATIVE 03/16/2023 2036   LEUKOCYTESUR NEGATIVE 03/16/2023 2036   Sepsis Labs Recent Labs  Lab 03/16/23 1743 03/17/23 0537 03/18/23 0527 03/19/23 0533  WBC 16.5* 11.3* 6.7 7.1   Microbiology Recent Results (from the past 240 hour(s))  Blood culture (routine x 2)     Status: None (Preliminary result)   Collection Time: 03/16/23  6:06 PM   Specimen: BLOOD RIGHT HAND  Result Value Ref Range Status   Specimen Description   Final    BLOOD RIGHT HAND Performed at [facility], [address] [place], [place] [address]    Special Requests   Final    AEROBIC BOTTLE ONLY Blood Culture adequate volume Performed at [facility], [address] [place], [place] [address]    Culture   Final    NO GROWTH 4 DAYS Performed at [facility], [address], [place], [place] [address]    Report Status PENDING  Incomplete  Blood culture (routine x 2)     Status: None (Preliminary result)   Collection Time: 03/16/23  6:07 PM   Specimen: BLOOD LEFT FOREARM  Result Value Ref Range Status   Specimen Description   Final    BLOOD LEFT FOREARM Performed at [facility], [address] [place], [place] [address]    Special Requests   Final    BOTTLES DRAWN AEROBIC AND ANAEROBIC Blood Culture results may not be optimal due to an excessive volume of blood received in culture bottles Performed at [facility], [address], [place], [place] [address]    Culture   Final    NO GROWTH 4 DAYS Performed at [facility], [address] [place], [place] [address]    Report Status PENDING  Incomplete    Procedures/Studies: CT Head Wo Contrast  Result Date: 03/16/2023 CLINICAL DATA:  Altered  mental status. Weakness and confusion. Fever. 02/24/2023 EXAM: CT HEAD WITHOUT CONTRAST TECHNIQUE: Contiguous axial images were obtained from the base of the skull through the vertex without intravenous contrast. RADIATION DOSE REDUCTION: This exam was performed according to the departmental dose-optimization program which includes automated exposure control, adjustment of the mA and/or kV according to patient size and/or use of iterative reconstruction technique. COMPARISON:  02/24/2023 CT.  09/10/2022 MRI. FINDINGS: Brain: No abnormality is seen affecting the brainstem. 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 Cerebral hemispheres show or probably represents old small vessel infarctions of the thalami, based on the prior imaging. Chronic small-vessel ischemic changes affect the cerebral hemispheric white matter. No cortical or large vessel territory stroke. The ventricles  are prominent, but this is chronic and likely due to central atrophy. No mass, hemorrhage or extra-axial collection. Vascular: There is atherosclerotic calcification of the major vessels at the base of the brain. Skull: Negative Sinuses/Orbits: Clear/normal Other: None IMPRESSION: 1. No acute CT finding. Chronic small-vessel ischemic changes of the cerebral hemispheric white matter. 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 2. The ventricles are prominent, but this is chronic and likely due to central atrophy. Electronically Signed   By: Paulina Fusi M.D.   On: 03/16/2023 19:58   DG Chest 2 View  Result Date: 03/16/2023 CLINICAL DATA:  Fever EXAM: CHEST - 2 VIEW COMPARISON:  X-ray 02/24/2023 FINDINGS: Hyperinflation. No pneumothorax or effusion. Normal cardiopericardial silhouette. Calcified aorta. Loop recorder overlies the lower left hemithorax. There is some asymmetric mild opacity in the right midlung. A subtle infiltrate is possible. Recommend follow-up. This could be superior segment right  lower lobe on the lateral view. Kyphotic x-ray obscures the apices. Degenerative changes. Osteopenia. IMPRESSION: Hyperinflation. Subtle asymmetric opacity in the right midthorax, possibly superior segment right lower lobe. An infiltrate is possible. Recommend follow-up. Electronically Signed   By: Karen Kays M.D.   On: 03/16/2023 18:46   CT HEAD WO CONTRAST ( )  Result Date: 02/24/2023 CLINICAL DATA:  Head trauma, minor (Age >= 65y) Mental status change, unknown cause EXAM: CT HEAD WITHOUT CONTRAST TECHNIQUE: Contiguous axial images were obtained from the base of the skull through the vertex without intravenous contrast. RADIATION DOSE REDUCTION: This exam was performed according to the departmental dose-optimization program which includes automated exposure control, adjustment of the mA and/or kV according to patient size and/or use of iterative reconstruction technique. COMPARISON:  02/14/2023 FINDINGS: Brain: Mild parenchymal volume loss is commensurate with the patient's age. Moderate bilateral periventricular and deep white matter changes are present likely reflecting the sequela of small vessel ischemia. These appear stable since prior examination. Remote bilateral thalamic lacunar infarcts again noted. No evidence of acute intracranial hemorrhage or infarct. No abnormal mass effect or midline shift. No abnormal intra or extra-axial mass lesion or fluid collection. Ventricular size is normal. Cerebellum is unremarkable. Vascular: No hyperdense vessel or unexpected calcification. Skull: Normal. Negative for fracture or focal lesion. Sinuses/Orbits: No acute finding. Other: Mastoid air cells and middle ear cavities are clear. IMPRESSION: 1. No acute intracranial hemorrhage or infarct. 2. Stable senescent change. 3. Remote bilateral thalamic lacunar infarcts. Electronically Signed   By: Helyn Numbers M.D.   On: 02/24/2023 20:16   DG Chest Port 1 View  Result Date: 02/24/2023 CLINICAL DATA:  Altered  mental status EXAM: PORTABLE CHEST 1 VIEW COMPARISON:  02/14/2023 x-ray FINDINGS: Hyperinflation. No consolidation, pneumothorax or effusion. No edema. Normal cardiopericardial silhouette with a calcified aorta. The previous opacity left lung base is no longer seen. Loop recorder overlying the lower left thorax. IMPRESSION: Improved left lung base opacity. Hyperinflation.  No effusion or edema. Loop recorder Electronically Signed   By: Karen Kays M.D.   On: 02/24/2023 17:11     Time coordinating discharge: Over 30 minutes    [name] Chamber, MD  [facility] 03/20/2023, 4:34 PM

## 2023-03-20 NOTE — TOC Transition Note (Addendum)
Transition of Care [facility]) - CM/SW Discharge Note   Patient Details  Name: [name] [name] MRN: [id number] Date of Birth: [birth year]  [facility] CM/SW Contact:  Adrian Prows, RN Phone Number: 03/20/2023, 12:53 PM   Clinical Narrative:    D/C orders received; pt's dtr [name] notified and declines ambulance transport; she says family will transport pt home; she says pt will need assistance getting dressed; Maralyn Sago, LPN notified via secure chat; no TOC needs.  -[phone]- LVM for Angie at [facility] for notification of pt d/c.  -[phone]- Angie at [facility] notified pt d/c  Final next level of care: Home w Home Health Services Barriers to Discharge: No Barriers Identified   Patient Goals and CMS Choice CMS Medicare.gov Compare Post Acute Care list provided to:: Legal Guardian Choice offered to / list presented to : [facility] POA / Guardian, Adult Children  Discharge Placement                    Name of family member notified: [name] (dtr) [phone] Patient and family notified of of transfer: 03/20/23  Discharge Plan and Services Additional resources added to the After Visit Summary for   In-house Referral: Clinical Social Work Discharge Planning Services: NA Post Acute Care Choice: Resumption of Svcs/PTA Provider, Home Health          DME Arranged: N/A DME Agency: NA                  Social Determinants of Health (SDOH) Interventions SDOH Screenings   Food Insecurity: No Food Insecurity (03/16/2023)  Housing: Low Risk  (03/16/2023)  Transportation Needs: No Transportation Needs (03/16/2023)  Utilities: Not At Risk (03/16/2023)  Social Connections: Unknown (12/02/2021)   Received from [facility]  Tobacco Use: Medium Risk (03/16/2023)     Readmission Risk Interventions    03/17/2023    2:50 PM 02/16/2023   11:04 AM 10/04/2022   11:17 AM  Readmission Risk Prevention Plan  Transportation Screening Complete Complete Complete  PCP or  Specialist Appt within 3-5 Days   Complete  HRI or Home Care Consult   Complete  Social Work Consult for Recovery Care Planning/Counseling   Complete  Palliative Care Screening   Not Applicable  Medication Review [employer]) Complete  Complete  PCP or Specialist appointment within 3-5 days of discharge Complete    HRI or Home Care Consult Complete Complete   SW Recovery Care/Counseling Consult Complete Complete   Palliative Care Screening Not Applicable    Skilled Nursing Facility Patient Refused

## 2023-03-20 NOTE — Plan of Care (Signed)
  Problem: Education: Goal: Knowledge of risk factors and measures for prevention of condition will improve Outcome: Progressing   Problem: Clinical Measurements: Goal: Will remain free from infection Outcome: Progressing   Problem: Clinical Measurements: Goal: Diagnostic test results will improve Outcome: Progressing   Problem: Safety: Goal: Ability to remain free from injury will improve Outcome: Progressing

## 2023-03-21 LAB — CULTURE, BLOOD (ROUTINE X 2)
Culture: NO GROWTH
Culture: NO GROWTH
Special Requests: ADEQUATE

## 2023-03-24 DIAGNOSIS — E1122 Type 2 diabetes mellitus with diabetic chronic kidney disease: Secondary | ICD-10-CM | POA: Diagnosis not present

## 2023-03-24 DIAGNOSIS — I700 Atherosclerosis of aorta: Secondary | ICD-10-CM | POA: Diagnosis not present

## 2023-03-24 DIAGNOSIS — Z7902 Long term (current) use of antithrombotics/antiplatelets: Secondary | ICD-10-CM | POA: Diagnosis not present

## 2023-03-24 DIAGNOSIS — G8220 Paraplegia, unspecified: Secondary | ICD-10-CM | POA: Diagnosis not present

## 2023-03-24 DIAGNOSIS — Z87440 Personal history of urinary (tract) infections: Secondary | ICD-10-CM | POA: Diagnosis not present

## 2023-03-24 DIAGNOSIS — E11649 Type 2 diabetes mellitus with hypoglycemia without coma: Secondary | ICD-10-CM | POA: Diagnosis not present

## 2023-03-24 DIAGNOSIS — Z7982 Long term (current) use of aspirin: Secondary | ICD-10-CM | POA: Diagnosis not present

## 2023-03-24 DIAGNOSIS — I5032 Chronic diastolic (congestive) heart failure: Secondary | ICD-10-CM | POA: Diagnosis not present

## 2023-03-24 DIAGNOSIS — N182 Chronic kidney disease, stage 2 (mild): Secondary | ICD-10-CM | POA: Diagnosis not present

## 2023-03-24 DIAGNOSIS — E039 Hypothyroidism, unspecified: Secondary | ICD-10-CM | POA: Diagnosis not present

## 2023-03-24 DIAGNOSIS — L89152 Pressure ulcer of sacral region, stage 2: Secondary | ICD-10-CM | POA: Diagnosis not present

## 2023-03-24 DIAGNOSIS — Z993 Dependence on wheelchair: Secondary | ICD-10-CM | POA: Diagnosis not present

## 2023-03-24 DIAGNOSIS — L89891 Pressure ulcer of other site, stage 1: Secondary | ICD-10-CM | POA: Diagnosis not present

## 2023-03-24 DIAGNOSIS — M810 Age-related osteoporosis without current pathological fracture: Secondary | ICD-10-CM | POA: Diagnosis not present

## 2023-03-24 DIAGNOSIS — E440 Moderate protein-calorie malnutrition: Secondary | ICD-10-CM | POA: Diagnosis not present

## 2023-03-24 DIAGNOSIS — Z794 Long term (current) use of insulin: Secondary | ICD-10-CM | POA: Diagnosis not present

## 2023-03-24 DIAGNOSIS — E1151 Type 2 diabetes mellitus with diabetic peripheral angiopathy without gangrene: Secondary | ICD-10-CM | POA: Diagnosis not present

## 2023-03-24 DIAGNOSIS — D649 Anemia, unspecified: Secondary | ICD-10-CM | POA: Diagnosis not present

## 2023-03-24 DIAGNOSIS — F0280 Dementia in other diseases classified elsewhere without behavioral disturbance: Secondary | ICD-10-CM | POA: Diagnosis not present

## 2023-03-24 DIAGNOSIS — U071 COVID-19: Secondary | ICD-10-CM | POA: Diagnosis not present

## 2023-03-25 ENCOUNTER — Encounter: Payer: Self-pay | Admitting: Vascular Surgery

## 2023-03-25 ENCOUNTER — Ambulatory Visit (INDEPENDENT_AMBULATORY_CARE_PROVIDER_SITE_OTHER): Payer: Medicare HMO | Admitting: Vascular Surgery

## 2023-03-25 ENCOUNTER — Ambulatory Visit (HOSPITAL_COMMUNITY)
Admission: RE | Admit: 2023-03-25 | Discharge: 2023-03-25 | Disposition: A | Discharge status: Home / Self Care | Payer: Medicare HMO | Source: Ambulatory Visit | Attending: Vascular Surgery | Admitting: Vascular Surgery

## 2023-03-25 VITALS — BP 149/71 | HR 57 | Temp 97.7°F | Resp 20 | Ht 64.0 in | Wt 125.0 lb

## 2023-03-25 DIAGNOSIS — I739 Peripheral vascular disease, unspecified: Secondary | ICD-10-CM | POA: Insufficient documentation

## 2023-03-25 DIAGNOSIS — I7025 Atherosclerosis of native arteries of other extremities with ulceration: Secondary | ICD-10-CM | POA: Diagnosis not present

## 2023-03-25 LAB — VAS US ABI WITH/WO TBI: Left ABI: 0.92

## 2023-03-25 NOTE — Progress Notes (Signed)
REASON FOR VISIT:   Follow-up after bilateral common iliac artery angioplasty and stenting  MEDICAL ISSUES:   PERIPHERAL ARTERIAL DISEASE WITH ULCERATIONS OF BOTH GREAT TOES: The patient presented with nonhealing wounds of the left great toe and also a small wound at the end of the right great toe.  She underwent an arteriogram and had bilateral common iliac artery kissing balloon angioplasty.  She has excellent femoral pulses now.  She does have some infrainguinal disease however the wounds appear to be improving.  Based on her previous arteriogram on the left side if the wound failed to heal she could be considered potentially for a retrograde intervention although her arteries are very small.  Given that the wounds appear to be improving we will hold off on any intervention.  She is 89 years old so certainly we want to avoid any further intervention if possible.  She is going to the wound care center to continue meticulous wound care.  I have ordered follow-up ABIs and a duplex of her iliac stents in 3 months.  She will be seen on the PA schedule.  I have explained that I will be retiring.  HPI:   [name] [name] is a pleasant 89 y.o. female who had presented with a wound on her left great toe.  She underwent an arteriogram on 02/08/2023 and had kissing balloon angioplasty of bilateral common iliac arteries with VBX stents.  Thus we addressed her inflow disease on the left.  I felt that if she has continued to have problems her only remaining option would be a retrograde intervention on the left although these arteries were quite small.  Since I saw her last she denies any rest pain.  I do not get any history of claudication although her activity is very limited.  She is scheduled to go to the wound care center to continue to follow her wounds on both great toes.   Past Medical History:  Diagnosis Date   Arthritis    "knees, legs" (06/10/2016)   Ascites    Chronic kidney disease     "related to my diabetes"   Chronic lower back pain    Dementia without behavioral disturbance (HCC)    Diabetes mellitus without complication (HCC)    Eczema    GERD (gastroesophageal reflux disease)    High cholesterol    Hyperlipidemia    Hypertension    Hypothyroidism    OAB (overactive bladder)    Osteoporosis    Thyroid disease    Type II diabetes mellitus (HCC)    Urticaria     Family History  Problem Relation Age of Onset   Diabetes Mother    Hypertension Mother    Asthma Father    Diabetes Sister    Stomach cancer Sister    Diabetes Brother    Allergic rhinitis Neg Hx    Eczema Neg Hx    Urticaria Neg Hx     SOCIAL HISTORY: Social History   Tobacco Use   Smoking status: Never   Smokeless tobacco: Former    Types: Chew    Quit date: 1985  Substance Use Topics   Alcohol use: No    Allergies  Allergen Reactions   Nsaids Other (See Comments)    No NSAIDS due to kidney function- ESPECIALLY ibuprofen or Aleve   Biaxin [Clarithromycin] Hives   Statins Other (See Comments)    Muscle pain (myositis per daugher)   Bactrim [Sulfamethoxazole-Trimethoprim] Hives and Rash   Lisinopril  Cough   Sulfa Antibiotics Hives, Rash and Other (See Comments)    NO sulfa-based meds!!     Current Outpatient Medications  Medication Sig Dispense Refill   acetaminophen (TYLENOL) 650 MG CR tablet Take 650 mg by mouth every 8 (eight) hours as needed for pain.     BAYER LOW DOSE 81 MG EC tablet Take 81 mg by mouth in the morning. Swallow whole.     cetirizine (ZYRTEC) 10 MG tablet Take 10 mg by mouth 3 (three) times daily as needed for allergies or rhinitis.     Cholecalciferol (VITAMIN D-3 PO) Take 2,000 Units by mouth daily.     clopidogrel (PLAVIX) 75 MG tablet Take 1 tablet (75 mg total) by mouth daily with breakfast. 30 tablet 2   collagenase (SANTYL) 250 UNIT/GM ointment Apply 1 Application topically See admin instructions. Applied to right great toe and bunion on the left  foot daily. Bandage loosely.     ezetimibe (ZETIA) 10 MG tablet Take 1 tablet (10 mg total) by mouth daily. (Patient taking differently: Take 10 mg by mouth at bedtime.) 30 tablet 11   famotidine (PEPCID) 20 MG tablet Take 20 mg by mouth at bedtime.     ferrous sulfate 220 (44 Fe) MG/5ML solution Take 330 mg by mouth 3 (three) times a week.     fluconazole (DIFLUCAN) 100 MG tablet Take 1 tablet (100 mg total) by mouth daily for 5 days. 5 tablet 0   folic acid (FOLVITE) 1 MG tablet Take 1 mg by mouth at bedtime.     insulin aspart (NOVOLOG FLEXPEN) 100 UNIT/ML FlexPen Inject 2-10 Units into the skin See admin instructions. Inject 2-10 units into the skin three times a day with meals, per sliding scale:  Breakfast: BGL 80-199 = 8 units; 200-299 = 9 units; 300 or greater = 10 units Lunch: BGL 80-199 = 5 units; 200-299 = 6 units; 300 or greater = 7 units Supper/evening meal: BGL 80-199 = 2 units; 200-299 = 3 units; 300 or greater = 4 units     insulin detemir (LEVEMIR FLEXTOUCH) 100 UNIT/ML FlexPen Inject 3-7 Units into the skin See admin instructions. Inject 7 units into the skin in the morning and 3 units at bedtime     levETIRAcetam (KEPPRA) 500 MG tablet Take 500 mg by mouth in the morning and at bedtime.     liver oil-zinc oxide (DESITIN) 40 % ointment Apply topically 3 (three) times daily. (Patient taking differently: Apply 1 Application topically as needed for irritation (BUTTOCKS).) 56.7 g 0   methotrexate (RHEUMATREX) 2.5 MG tablet Take 1 tablet (2.5 mg total) by mouth once a week. Caution:Chemotherapy. Protect from light. (Patient taking differently: Take 12.5 mg by mouth every Monday. Caution:Chemotherapy. Protect from light.) 4 tablet 0   metoprolol tartrate (LOPRESSOR) 25 MG tablet Take 1 tablet (25 mg total) by mouth 2 (two) times daily. (Patient taking differently: Take 12.5 mg by mouth as needed (if Systolic number is over 160 for over 2 hours).) 60 tablet 3   midodrine (PROAMATINE) 5 MG  tablet Take 0.5 tablets (2.5 mg total) by mouth 3 (three) times daily with meals. 90 tablet 2   Multiple Vitamin (MULTIVITAMIN WITH MINERALS) TABS tablet Take 1 tablet by mouth daily with breakfast.     PLAQUENIL 200 MG tablet Take 200 mg by mouth at bedtime.     predniSONE (DELTASONE) 5 MG tablet Take 5 mg by mouth daily with breakfast.     SYNTHROID 100 MCG  tablet Take 100 mcg by mouth daily before breakfast.     triamcinolone ointment (KENALOG) 0.1 % Apply 1 Application topically 2 (two) times daily as needed (skin irritation- affected areas).     No current facility-administered medications for this visit.    REVIEW OF SYSTEMS:  [X]  denotes positive finding, [ ]  denotes negative finding Cardiac  Comments:  Chest pain or chest pressure:    Shortness of breath upon exertion:    Short of breath when lying flat:    Irregular heart rhythm:        Vascular    Pain in calf, thigh, or hip brought on by ambulation:    Pain in feet at night that wakes you up from your sleep:     Blood clot in your veins:    Leg swelling:         Pulmonary    Oxygen at home:    Productive cough:     Wheezing:         Neurologic    Sudden weakness in arms or legs:     Sudden numbness in arms or legs:     Sudden onset of difficulty speaking or slurred speech:    Temporary loss of vision in one eye:     Problems with dizziness:         Gastrointestinal    Blood in stool:     Vomited blood:         Genitourinary    Burning when urinating:     Blood in urine:        Psychiatric    Major depression:         Hematologic    Bleeding problems:    Problems with blood clotting too easily:        Skin    Rashes or ulcers:        Constitutional    Fever or chills:     PHYSICAL EXAM:   Vitals:   03/25/23 1312  BP: (!) 149/71  Pulse: (!) 57  Resp: 20  Temp: 97.7 F (36.5 C)  SpO2: 95%  Weight: 125 lb (56.7 kg)  Height: 5\' 4"  (1.626 m)    GENERAL: The patient is a well-nourished  female, in no acute distress. The vital signs are documented above. CARDIAC: There is a regular rate and rhythm.  VASCULAR: She has normal femoral pulses bilaterally. PULMONARY: There is good air exchange bilaterally without wheezing or rales. MUSCULOSKELETAL: There are no major deformities or cyanosis. NEUROLOGIC: No focal weakness or paresthesias are detected. SKIN: The wound on her left great toe is gradually improving as documented in the photograph below.   There is a very small wound on the tip of the right great toe.  PSYCHIATRIC: The patient has a normal affect.  DATA:    ARTERIAL DOPPLER STUDY: I have independently interpreted her arterial Doppler study today.  On the right side, there is a biphasic posterior tibial and dorsalis pedis signal.  The arteries were not compressible so an ABI cannot be obtained.  On the left side there was a biphasic posterior tibial and dorsalis pedis signal.  ABI was 92%.  Toe pressure could not be obtained.  [name] [phone]

## 2023-03-26 DIAGNOSIS — I700 Atherosclerosis of aorta: Secondary | ICD-10-CM | POA: Diagnosis not present

## 2023-03-26 DIAGNOSIS — F0280 Dementia in other diseases classified elsewhere without behavioral disturbance: Secondary | ICD-10-CM | POA: Diagnosis not present

## 2023-03-26 DIAGNOSIS — Z7982 Long term (current) use of aspirin: Secondary | ICD-10-CM | POA: Diagnosis not present

## 2023-03-26 DIAGNOSIS — E1122 Type 2 diabetes mellitus with diabetic chronic kidney disease: Secondary | ICD-10-CM | POA: Diagnosis not present

## 2023-03-26 DIAGNOSIS — Z87440 Personal history of urinary (tract) infections: Secondary | ICD-10-CM | POA: Diagnosis not present

## 2023-03-26 DIAGNOSIS — E440 Moderate protein-calorie malnutrition: Secondary | ICD-10-CM | POA: Diagnosis not present

## 2023-03-26 DIAGNOSIS — N182 Chronic kidney disease, stage 2 (mild): Secondary | ICD-10-CM | POA: Diagnosis not present

## 2023-03-26 DIAGNOSIS — D649 Anemia, unspecified: Secondary | ICD-10-CM | POA: Diagnosis not present

## 2023-03-26 DIAGNOSIS — I5032 Chronic diastolic (congestive) heart failure: Secondary | ICD-10-CM | POA: Diagnosis not present

## 2023-03-26 DIAGNOSIS — Z794 Long term (current) use of insulin: Secondary | ICD-10-CM | POA: Diagnosis not present

## 2023-03-26 DIAGNOSIS — Z7902 Long term (current) use of antithrombotics/antiplatelets: Secondary | ICD-10-CM | POA: Diagnosis not present

## 2023-03-26 DIAGNOSIS — M810 Age-related osteoporosis without current pathological fracture: Secondary | ICD-10-CM | POA: Diagnosis not present

## 2023-03-26 DIAGNOSIS — G8220 Paraplegia, unspecified: Secondary | ICD-10-CM | POA: Diagnosis not present

## 2023-03-26 DIAGNOSIS — U071 COVID-19: Secondary | ICD-10-CM | POA: Diagnosis not present

## 2023-03-26 DIAGNOSIS — Z993 Dependence on wheelchair: Secondary | ICD-10-CM | POA: Diagnosis not present

## 2023-03-26 DIAGNOSIS — E039 Hypothyroidism, unspecified: Secondary | ICD-10-CM | POA: Diagnosis not present

## 2023-03-26 DIAGNOSIS — E1151 Type 2 diabetes mellitus with diabetic peripheral angiopathy without gangrene: Secondary | ICD-10-CM | POA: Diagnosis not present

## 2023-03-26 DIAGNOSIS — E11649 Type 2 diabetes mellitus with hypoglycemia without coma: Secondary | ICD-10-CM | POA: Diagnosis not present

## 2023-03-26 DIAGNOSIS — L89891 Pressure ulcer of other site, stage 1: Secondary | ICD-10-CM | POA: Diagnosis not present

## 2023-03-26 DIAGNOSIS — L89152 Pressure ulcer of sacral region, stage 2: Secondary | ICD-10-CM | POA: Diagnosis not present

## 2023-03-29 ENCOUNTER — Encounter (HOSPITAL_BASED_OUTPATIENT_CLINIC_OR_DEPARTMENT_OTHER): Payer: Medicare HMO | Attending: Internal Medicine | Admitting: Internal Medicine

## 2023-03-29 DIAGNOSIS — L97528 Non-pressure chronic ulcer of other part of left foot with other specified severity: Secondary | ICD-10-CM | POA: Diagnosis not present

## 2023-03-29 DIAGNOSIS — E11621 Type 2 diabetes mellitus with foot ulcer: Secondary | ICD-10-CM | POA: Diagnosis not present

## 2023-03-29 DIAGNOSIS — L97518 Non-pressure chronic ulcer of other part of right foot with other specified severity: Secondary | ICD-10-CM

## 2023-03-29 DIAGNOSIS — I739 Peripheral vascular disease, unspecified: Secondary | ICD-10-CM | POA: Diagnosis not present

## 2023-03-29 NOTE — Progress Notes (Signed)
[name] [name], [name] [name] ([id number]) [id number] Page 1 of 8 Visit Report for 03/29/2023 Chief Complaint Document Details Patient Name: Date of Service: [name] [name], [name] [name]. 03/29/2023 2:00 PM Medical Record Number: [id number] Patient Account Number: [id number] Date of Birth/Sex: Treating RN: 20-Aug-1933 (89 y.o. F) Primary Care Provider: Hillard [name] Other Clinician: Referring Provider: Treating Provider/Extender: [name] [name] Weeks in Treatment: 15 Information Obtained from: Patient Chief Complaint 12/10/2022; small open wounds to the feet bilaterally Electronic Signature(s) Signed: 03/29/2023 4:15:32 PM By: [name] Corwin DO Entered By: [name] [name] on 03/29/2023 11:43:13 -------------------------------------------------------------------------------- HPI Details Patient Name: Date of Service: [name] [name], [name] [name]. 03/29/2023 2:00 PM Medical Record Number: [id number] Patient Account Number: [id number] Date of Birth/Sex: Treating RN: 11-Jan-1934 (89 y.o. F) Primary Care Provider: Hillard [name] Other Clinician: Referring Provider: Treating Provider/Extender: [name] [name] Weeks in Treatment: 15 History of Present Illness HPI Description: 12/10/2022 [name] [name] is an 89 year old female with a past medical history of PAD, dementia, uncontrolled type 2 diabetes on insulin that presents the clinic for a 1 year history of wounds to her feet bilaterally. Daughter is present and helps provide the history. How the wounds have started is unclear. She has been using surgical shoes to offload the wounds. She has been using Santyl to the wound beds. She has slight erythema that daughter states is new around the periwound on the right and left great toe. She has a history of PAD and is followed with vein and vascular for this last seeing them on 10/2019. She had no wounds at that time but ABIs showed moderate right infrapopliteal artery occlusive disease  and moderate multisegmental left lower extremity arterial occlusive disease. Patient has chronic pain to the wound sites. 6/4; send patient with wounds at the tip of her left and right first toes and the left medial MTP. We have been using Santyl to the wounds. 6/13; patient presents for follow-up. Patient has been using Santyl and Hydrofera Blue. She has no issues or complaints. She is scheduled to see vein and vascular on 7/10. 7/12; patient presents for follow-up. She has been using Santyl and Hydrofera Blue to the wound beds. She saw vein and vascular on 7/10 and She had repeat ABIs that showed monophasic waveforms throughout the right and left lower extremity. Left worse than right. She had dampened waveforms on the right and left toes. plan is for angiogram. She denies signs of infection. Left great toe wound is healed. 9/9; Patient presents for follow-up. She had an abdominal aortogram on 7/22 with balloon angioplasty of bilateral common iliac artery. On the left side there is moderate disease throughout the superficial femoral artery and then a tight stenosis at the popliteal artery at the level of the knee. The proximal tibial vessels are all occluded. There is reconstitution of a small posterior tibial artery on the left, a small peroneal and small arterial tibial artery and dorsalis pedis artery. On the right side the common femoral, deep femoral and superficial femoral artery are patent. The tibial vessels are all occluded proximally with reconstitution of the anterior tibial artery. The posterior tibial artery reconstituted but occludes above the ankle. The peroneal artery is occluded. Her inflow disease on the left has been addressed. She has been using Santyl and Hydrofera Blue to the wound beds. These are very small wounds. She has no issues or complaints today. Electronic Signature(s) Signed: 03/29/2023 4:15:32 PM By: [name] Corwin DO Entered By: [name] [name] on 03/29/2023  11:45:43 [name] [name], [name] [name] (  [id number]) [id number] Page 2 of 8 -------------------------------------------------------------------------------- Physical Exam Details Patient Name: Date of Service: [name] [name], [name] [name] 03/29/2023 2:00 PM Medical Record Number: [id number] Patient Account Number: [id number] Date of Birth/Sex: Treating RN: 1934-05-15 (89 y.o. F) Primary Care Provider: Hillard [name] Other Clinician: Referring Provider: Treating Provider/Extender: [name] [name] Weeks in Treatment: 15 Constitutional respirations regular, non-labored and within target range for patient.. Cardiovascular 2+ dorsalis pedis/posterior tibialis pulses. Psychiatric pleasant and cooperative. Notes T the right great toe distal aspect there is an open wound with granulation tissue present and scant non viable tissue. T the medial left foot there is a bunion o o and a small open wound there with granulation tissue and scant non viable tissue. No signs of surrounding infection including increased warmth, erythema or purulent drainage. Electronic Signature(s) Signed: 03/29/2023 4:15:32 PM By: [name] Corwin DO Entered By: [name] [name] on 03/29/2023 11:48:02 -------------------------------------------------------------------------------- Physician Orders Details Patient Name: Date of Service: [name] [name], [name] [name]. 03/29/2023 2:00 PM Medical Record Number: [id number] Patient Account Number: [id number] Date of Birth/Sex: Treating RN: January 23, 1934 (89 y.o. [name] [name] Primary Care Provider: Hillard [name] Other Clinician: Referring Provider: Treating Provider/Extender: [name] [name] Weeks in Treatment: 15 Verbal / Phone Orders: No Diagnosis Coding Follow-up Appointments ppointment in 2 weeks. - Dr. Mikey [name] ROOM 8 Return A Anesthetic (In clinic) Topical Lidocaine 4% applied to wound bed Bathing/ Shower/ Hygiene Other Bathing/Shower/Hygiene  Orders/Instructions: - Bathe feet with soap and water as tolerated. (please do not soak the feet) Off-Loading Other: - Float feet with a pillow under the calf while in bed Home Health No change in wound care orders this week; continue Home Health for wound care. May utilize formulary equivalent dressing for wound treatment orders unless otherwise specified. - Please use Santyl on wound bed and then place Hydrafera Blue Ready on Santyl for the 3 wounds. Dressing changes to be completed by Home Health on Monday / Wednesday / Friday except when patient has scheduled visit at [facility]. - Or 3 times a week for Home Health Other Home Health Orders/Instructions: - [facility] Wound Treatment Wound #3 - T Great oe Wound Laterality: Right, Distal MIKAIYA, STALLONE ([id number]) [id number] Page 3 of 8 Cleanser: Soap and Water 1 x Per Day/30 Days Discharge Instructions: May shower and wash wound with dial antibacterial soap and water prior to dressing change. Cleanser: Wound Cleanser 1 x Per Day/30 Days Discharge Instructions: Cleanse the wound with wound cleanser prior to applying a clean dressing using gauze sponges, not tissue or cotton balls. Prim Dressing: Hydrofera Blue Ready Transfer Foam, 2.5x2.5 (in/in) 1 x Per Day/30 Days ary Discharge Instructions: Apply directly to wound bed as directed Prim Dressing: Optifoam Non-Adhesive Dressing, 4x4 in 1 x Per Day/30 Days ary Discharge Instructions: Apply to wound bed as instructed Prim Dressing: Santyl Ointment 1 x Per Day/30 Days ary Discharge Instructions: Apply nickel thick amount to wound bed as instructed Secured With: Conforming Stretch Gauze Bandage, Sterile 2x75 (in/in) 1 x Per Day/30 Days Discharge Instructions: Secure with stretch gauze as directed. Secured With: 2M Medipore Scientist, research (life sciences) Surgical T 2x10 (in/yd) 1 x Per Day/30 Days ape Discharge Instructions: Secure with tape as directed. Wound #4 - Foot  Wound Laterality: Left, Medial Cleanser: Soap and Water 1 x Per Day/30 Days Discharge Instructions: May shower and wash wound with dial antibacterial soap and water prior to dressing change. Cleanser: Wound Cleanser 1 x Per Day/30 Days Discharge Instructions: Cleanse the wound with wound cleanser prior  to applying a clean dressing using gauze sponges, not tissue or cotton balls. Prim Dressing: Hydrofera Blue Ready Transfer Foam, 2.5x2.5 (in/in) 1 x Per Day/30 Days ary Discharge Instructions: Apply directly to wound bed as directed Prim Dressing: Santyl Ointment 1 x Per Day/30 Days ary Discharge Instructions: Apply nickel thick amount to wound bed as instructed Secondary Dressing: Bordered Gauze, 2x2 in 1 x Per Day/30 Days Discharge Instructions: Apply over primary dressing as directed. Electronic Signature(s) Signed: 03/29/2023 4:15:32 PM By: [name] Corwin DO Entered By: [name] [name] on 03/29/2023 11:48:10 -------------------------------------------------------------------------------- Problem List Details Patient Name: Date of Service: [name] [name], [name] Rankins [name]. 03/29/2023 2:00 PM Medical Record Number: [id number] Patient Account Number: [id number] Date of Birth/Sex: Treating RN: 1934-05-25 (89 y.o. [name] [name] Primary Care Provider: Hillard [name] Other Clinician: Referring Provider: Treating Provider/Extender: [name] [name] Weeks in Treatment: 15 Active Problems ICD-10 Encounter Code Description Active Date MDM Diagnosis L97.518 Non-pressure chronic ulcer of other part of right foot with other specified 12/10/2022 No Yes severity L97.528 Non-pressure chronic ulcer of other part of left foot with other specified 12/10/2022 No Yes severity [name] [name], [name] [name] ([id number]) [phone].pdf Page 4 of 8 E11.621 Type 2 diabetes mellitus with foot ulcer 12/10/2022 No Yes F03.90 Unspecified dementia, unspecified severity, without behavioral disturbance,  12/10/2022 No Yes psychotic disturbance, mood disturbance, and anxiety I73.9 Peripheral vascular disease, unspecified 12/10/2022 No Yes Inactive Problems Resolved Problems Electronic Signature(s) Signed: 03/29/2023 4:15:32 PM By: [name] Corwin DO Entered By: [name] [name] on 03/29/2023 11:42:20 -------------------------------------------------------------------------------- Progress Note Details Patient Name: Date of Service: [name] [name], [name] [name]. 03/29/2023 2:00 PM Medical Record Number: [id number] Patient Account Number: [id number] Date of Birth/Sex: Treating RN: [birth year] (89 y.o. F) Primary Care Provider: Hillard [name] Other Clinician: Referring Provider: Treating Provider/Extender: [name] [name] Weeks in Treatment: 15 Subjective Chief Complaint Information obtained from Patient 12/10/2022; small open wounds to the feet bilaterally History of Present Illness (HPI) 12/10/2022 Ms. Winnifred Devillers is an 89 year old female with a past medical history of PAD, dementia, uncontrolled type 2 diabetes on insulin that presents the clinic for a 1 year history of wounds to her feet bilaterally. Daughter is present and helps provide the history. How the wounds have started is unclear. She has been using surgical shoes to offload the wounds. She has been using Santyl to the wound beds. She has slight erythema that daughter states is new around the periwound on the right and left great toe. She has a history of PAD and is followed with vein and vascular for this last seeing them on 10/2019. She had no wounds at that time but ABIs showed moderate right infrapopliteal artery occlusive disease and moderate multisegmental left lower extremity arterial occlusive disease. Patient has chronic pain to the wound sites. 6/4; send patient with wounds at the tip of her left and right first toes and the left medial MTP. We have been using Santyl to the wounds. 6/13; patient presents for follow-up.  Patient has been using Santyl and Hydrofera Blue. She has no issues or complaints. She is scheduled to see vein and vascular on 7/10. 7/12; patient presents for follow-up. She has been using Santyl and Hydrofera Blue to the wound beds. She saw vein and vascular on 7/10 and She had repeat ABIs that showed monophasic waveforms throughout the right and left lower extremity. Left worse than right. She had dampened waveforms on the right and left toes. plan is for angiogram. She denies signs of infection. Left great toe wound is  healed. 9/9; Patient presents for follow-up. She had an abdominal aortogram on 7/22 with balloon angioplasty of bilateral common iliac artery. On the left side there is moderate disease throughout the superficial femoral artery and then a tight stenosis at the popliteal artery at the level of the knee. The proximal tibial vessels are all occluded. There is reconstitution of a small posterior tibial artery on the left, a small peroneal and small arterial tibial artery and dorsalis pedis artery. On the right side the common femoral, deep femoral and superficial femoral artery are patent. The tibial vessels are all occluded proximally with reconstitution of the anterior tibial artery. The posterior tibial artery reconstituted but occludes above the ankle. The peroneal artery is occluded. Her inflow disease on the left has been addressed. She has been using Santyl and Hydrofera Blue to the wound beds. These are very small wounds. She has no issues or complaints today. Patient History Information obtained from Caregiver, Chart. Family History Cancer - Siblings, Diabetes - Mother,Siblings, Hypertension - Mother, Lung Disease - Father. Social History Never smoker, Marital Status - Widowed, Alcohol Use - Never, Drug Use - No History, Caffeine Use - Rarely - coffee. Medical History [name] [name], [name] [name] ([id number]) [id number] Page 5 of 8 Cardiovascular Patient  has history of Hypertension, Hypotension - orthostatic, Peripheral Arterial Disease, Peripheral Venous Disease Gastrointestinal Patient has history of Colitis Endocrine Patient has history of Type II Diabetes Neurologic Patient has history of Dementia, Seizure Disorder Hospitalization/Surgery History - loop recorder insertion 2021. - 2018 laparotomy. - abdominal hysterectomy. - knee arthroscopy. - vocal cord polyps. Medical A Surgical History Notes nd Ear/Nose/Mouth/Throat dysphagia Cardiovascular hyperlipidemia, Gastrointestinal GERD viral gastroenteritis small bower obstruction 2018 Endocrine hypothyroidism, Genitourinary CKD II urinary retention Integumentary (Skin) eczema Musculoskeletal degenerative joint disease osteoporosis Neurologic delirium encephalopathy Objective Constitutional respirations regular, non-labored and within target range for patient.. Vitals Time Taken: 2:03 PM, Height: 66 in, Weight: 110 lbs, BMI: 17.8, Temperature: 98.3 F, Pulse: 58 bpm, Respiratory Rate: 18 breaths/min, Blood Pressure: 159/85 mmHg. Cardiovascular 2+ dorsalis pedis/posterior tibialis pulses. Psychiatric pleasant and cooperative. General Notes: T the right great toe distal aspect there is an open wound with granulation tissue present and scant non viable tissue. T the medial left foot o o there is a bunion and a small open wound there with granulation tissue and scant non viable tissue. No signs of surrounding infection including increased warmth, erythema or purulent drainage. Integumentary (Hair, Skin) Wound #3 status is Open. Original cause of wound was Gradually Appeared. The date acquired was: 12/09/2021. The wound has been in treatment 15 weeks. The wound is located on the Right,Distal T [place]. The wound measures 0.3cm length x 0.3cm width x 0.1cm depth; 0.071cm^2 area and 0.007cm^3 volume. oe There is Fat Layer (Subcutaneous Tissue) exposed. There is no tunneling or  undermining noted. There is a medium amount of serosanguineous drainage noted. The wound margin is fibrotic, thickened scar. There is small (1-33%) red granulation within the wound bed. There is a large (67-100%) amount of necrotic tissue within the wound bed including Adherent Slough. The periwound skin appearance had no abnormalities noted for texture. The periwound skin appearance had no abnormalities noted for moisture. The periwound skin appearance had no abnormalities noted for color. Periwound temperature was noted as No Abnormality. Wound #4 status is Open. Original cause of wound was Gradually Appeared. The date acquired was: 12/09/2021. The wound has been in treatment 15 weeks. The wound is located on the Left,Medial Foot. The wound measures 0.2cm  length x 0.2cm width x 0.1cm depth; 0.031cm^2 area and 0.003cm^3 volume. There is no tunneling or undermining noted. There is a none present amount of drainage noted. The wound margin is distinct with the outline attached to the wound base. There is medium (34-66%) red granulation within the wound bed. There is a medium (34-66%) amount of necrotic tissue within the wound bed including Adherent Slough. The periwound skin appearance had no abnormalities noted for texture. The periwound skin appearance had no abnormalities noted for moisture. The periwound skin appearance had no abnormalities noted for color. Periwound temperature was noted as No Abnormality. Assessment Active Problems ICD-10 Non-pressure chronic ulcer of other part of right foot with other specified severity Non-pressure chronic ulcer of other part of left foot with other specified severity Type 2 diabetes mellitus with foot ulcer Unspecified dementia, unspecified severity, without behavioral disturbance, psychotic disturbance, mood disturbance, and anxiety Peripheral vascular disease, unspecified [name] [name], [name] [name] ([id number]) [id number] Page 6 of  8 Patient's wounds are small and well-healing. I recommended continuing with Santyl and Hydrofera Blue. Continue aggressive offloading with surgical shoe. Follow-up in 2 weeks. Plan Follow-up Appointments: Return Appointment in 2 weeks. - Dr. Mikey [name] ROOM 8 Anesthetic: (In clinic) Topical Lidocaine 4% applied to wound bed Bathing/ Shower/ Hygiene: Other Bathing/Shower/Hygiene Orders/Instructions: - Bathe feet with soap and water as tolerated. (please do not soak the feet) Off-Loading: Other: - Float feet with a pillow under the calf while in bed Home Health: No change in wound care orders this week; continue Home Health for wound care. May utilize formulary equivalent dressing for wound treatment orders unless otherwise specified. - Please use Santyl on wound bed and then place Hydrafera Blue Ready on Santyl for the 3 wounds. Dressing changes to be completed by Home Health on Monday / Wednesday / Friday except when patient has scheduled visit [facility]. - Or 3 times a week for Home Health Other Home Health Orders/Instructions: - [facility] WOUND #3: - T Great Wound Laterality: Right, Distal oe Cleanser: Soap and Water 1 x Per Day/30 Days Discharge Instructions: May shower and wash wound with dial antibacterial soap and water prior to dressing change. Cleanser: Wound Cleanser 1 x Per Day/30 Days Discharge Instructions: Cleanse the wound with wound cleanser prior to applying a clean dressing using gauze sponges, not tissue or cotton balls. Prim Dressing: Hydrofera Blue Ready Transfer Foam, 2.5x2.5 (in/in) 1 x Per Day/30 Days ary Discharge Instructions: Apply directly to wound bed as directed Prim Dressing: Optifoam Non-Adhesive Dressing, 4x4 in 1 x Per Day/30 Days ary Discharge Instructions: Apply to wound bed as instructed Prim Dressing: Santyl Ointment 1 x Per Day/30 Days ary Discharge Instructions: Apply nickel thick amount to wound bed as instructed Secured With:  Conforming Stretch Gauze Bandage, Sterile 2x75 (in/in) 1 x Per Day/30 Days Discharge Instructions: Secure with stretch gauze as directed. Secured With: 57M Medipore Scientist, research (life sciences) Surgical T 2x10 (in/yd) 1 x Per Day/30 Days ape Discharge Instructions: Secure with tape as directed. WOUND #4: - Foot Wound Laterality: Left, Medial Cleanser: Soap and Water 1 x Per Day/30 Days Discharge Instructions: May shower and wash wound with dial antibacterial soap and water prior to dressing change. Cleanser: Wound Cleanser 1 x Per Day/30 Days Discharge Instructions: Cleanse the wound with wound cleanser prior to applying a clean dressing using gauze sponges, not tissue or cotton balls. Prim Dressing: Hydrofera Blue Ready Transfer Foam, 2.5x2.5 (in/in) 1 x Per Day/30 Days ary Discharge Instructions: Apply directly to wound bed  as directed Prim Dressing: Santyl Ointment 1 x Per Day/30 Days ary Discharge Instructions: Apply nickel thick amount to wound bed as instructed Secondary Dressing: Bordered Gauze, 2x2 in 1 x Per Day/30 Days Discharge Instructions: Apply over primary dressing as directed. 1. Santyl and Hydrofera Blue 2. Surgical shoes for offloading 3. Follow-up in 1 week Electronic Signature(s) Signed: 03/29/2023 4:15:32 PM By: [name] Corwin DO Entered By: [name] [name] on 03/29/2023 11:50:34 -------------------------------------------------------------------------------- HxROS Details Patient Name: Date of Service: [name] [name], Jeanni [name]. 03/29/2023 2:00 PM Medical Record Number: [id number] Patient Account Number: [id number] Date of Birth/Sex: Treating RN: Jan 30, 1934 (89 y.o. F) Primary Care Provider: Hillard [name] Other Clinician: Referring Provider: Treating Provider/Extender: [name] [name] Weeks in Treatment: 15 Information Obtained From Caregiver Chart Ear/Nose/Mouth/Throat Medical History: [name] [name], TINDEL ([id number]) [id number] Page 7 of 8 Past  Medical History Notes: dysphagia Cardiovascular Medical History: Positive for: Hypertension; Hypotension - orthostatic; Peripheral Arterial Disease; Peripheral Venous Disease Past Medical History Notes: hyperlipidemia, Gastrointestinal Medical History: Positive for: Colitis Past Medical History Notes: GERD viral gastroenteritis small bower obstruction 2018 Endocrine Medical History: Positive for: Type II Diabetes Past Medical History Notes: hypothyroidism, Time with diabetes: 40 years Treated with: Insulin Blood sugar tested every day: Yes Tested : 3 x day Blood sugar testing results: Breakfast: 200 Genitourinary Medical History: Past Medical History Notes: CKD II urinary retention Integumentary (Skin) Medical History: Past Medical History Notes: eczema Musculoskeletal Medical History: Past Medical History Notes: degenerative joint disease osteoporosis Neurologic Medical History: Positive for: Dementia; Seizure Disorder Past Medical History Notes: delirium encephalopathy Immunizations Pneumococcal Vaccine: Received Pneumococcal Vaccination: Yes Received Pneumococcal Vaccination On or After 60th Birthday: Yes Implantable Devices Yes Hospitalization / Surgery History Type of Hospitalization/Surgery loop recorder insertion 2021 2018 laparotomy abdominal hysterectomy knee arthroscopy vocal cord polyps Family and Social History Cancer: Yes - Siblings; Diabetes: Yes - Mother,Siblings; Hypertension: Yes - Mother; Lung Disease: Yes - Father; Never smoker; Marital Status - Widowed; Alcohol Use: Never; Drug Use: No History; Caffeine Use: Rarely - coffee; Financial Concerns: No; Food, Clothing or Shelter Needs: No; Support System Lacking: No; Transportation Concerns: No CHRISSANDRA, LIUZZA ([id number]) [id number] Page 8 of 8 Electronic Signature(s) Signed: 03/29/2023 4:15:32 PM By: [name] Corwin DO Entered By: [name] [name] on  03/29/2023 11:45:49 -------------------------------------------------------------------------------- SuperBill Details Patient Name: Date of Service: [name] [name], Mailen [name]. 03/29/2023 Medical Record Number: [id number] Patient Account Number: [id number] Date of Birth/Sex: Treating RN: 1934-05-26 (89 y.o. F) Primary Care Provider: Hillard [name] Other Clinician: Referring Provider: Treating Provider/Extender: [name] [name] Weeks in Treatment: 15 Diagnosis Coding ICD-10 Codes Code Description L97.518 Non-pressure chronic ulcer of other part of right foot with other specified severity L97.528 Non-pressure chronic ulcer of other part of left foot with other specified severity E11.621 Type 2 diabetes mellitus with foot ulcer F03.90 Unspecified dementia, unspecified severity, without behavioral disturbance, psychotic disturbance, mood disturbance, and anxiety I73.9 Peripheral vascular disease, unspecified Physician Procedures : CPT4 Code Description Modifier [id number] - WC PHYS LEVEL 3 - EST PT ICD-10 Diagnosis Description L97.518 Non-pressure chronic ulcer of other part of right foot with other specified severity L97.528 Non-pressure chronic ulcer of other part of left  foot with other specified severity E11.621 Type 2 diabetes mellitus with foot ulcer I73.9 Peripheral vascular disease, unspecified Quantity: 1 Electronic Signature(s) Signed: 03/29/2023 4:15:32 PM By: [name] Corwin DO Entered By: [name] [name] on 03/29/2023 11:51:17

## 2023-03-30 DIAGNOSIS — L89152 Pressure ulcer of sacral region, stage 2: Secondary | ICD-10-CM | POA: Diagnosis not present

## 2023-03-30 DIAGNOSIS — F0280 Dementia in other diseases classified elsewhere without behavioral disturbance: Secondary | ICD-10-CM | POA: Diagnosis not present

## 2023-03-30 DIAGNOSIS — G8220 Paraplegia, unspecified: Secondary | ICD-10-CM | POA: Diagnosis not present

## 2023-03-30 DIAGNOSIS — E1151 Type 2 diabetes mellitus with diabetic peripheral angiopathy without gangrene: Secondary | ICD-10-CM | POA: Diagnosis not present

## 2023-03-30 DIAGNOSIS — E11649 Type 2 diabetes mellitus with hypoglycemia without coma: Secondary | ICD-10-CM | POA: Diagnosis not present

## 2023-03-30 DIAGNOSIS — E1122 Type 2 diabetes mellitus with diabetic chronic kidney disease: Secondary | ICD-10-CM | POA: Diagnosis not present

## 2023-03-30 DIAGNOSIS — U071 COVID-19: Secondary | ICD-10-CM | POA: Diagnosis not present

## 2023-03-30 DIAGNOSIS — I700 Atherosclerosis of aorta: Secondary | ICD-10-CM | POA: Diagnosis not present

## 2023-03-30 DIAGNOSIS — E039 Hypothyroidism, unspecified: Secondary | ICD-10-CM | POA: Diagnosis not present

## 2023-03-30 DIAGNOSIS — Z7982 Long term (current) use of aspirin: Secondary | ICD-10-CM | POA: Diagnosis not present

## 2023-03-30 DIAGNOSIS — Z7902 Long term (current) use of antithrombotics/antiplatelets: Secondary | ICD-10-CM | POA: Diagnosis not present

## 2023-03-30 DIAGNOSIS — L89891 Pressure ulcer of other site, stage 1: Secondary | ICD-10-CM | POA: Diagnosis not present

## 2023-03-30 DIAGNOSIS — Z794 Long term (current) use of insulin: Secondary | ICD-10-CM | POA: Diagnosis not present

## 2023-03-30 DIAGNOSIS — I5032 Chronic diastolic (congestive) heart failure: Secondary | ICD-10-CM | POA: Diagnosis not present

## 2023-03-30 DIAGNOSIS — Z993 Dependence on wheelchair: Secondary | ICD-10-CM | POA: Diagnosis not present

## 2023-03-30 DIAGNOSIS — M810 Age-related osteoporosis without current pathological fracture: Secondary | ICD-10-CM | POA: Diagnosis not present

## 2023-03-30 DIAGNOSIS — Z87440 Personal history of urinary (tract) infections: Secondary | ICD-10-CM | POA: Diagnosis not present

## 2023-03-30 DIAGNOSIS — E440 Moderate protein-calorie malnutrition: Secondary | ICD-10-CM | POA: Diagnosis not present

## 2023-03-30 DIAGNOSIS — N182 Chronic kidney disease, stage 2 (mild): Secondary | ICD-10-CM | POA: Diagnosis not present

## 2023-03-30 DIAGNOSIS — D649 Anemia, unspecified: Secondary | ICD-10-CM | POA: Diagnosis not present

## 2023-03-30 NOTE — Progress Notes (Signed)
[name], [name] ([id number]) [id number] Page 1 of 10 Visit Report for 03/29/2023 Arrival Information Details Patient Name: Date of Service: [name], [name]. 03/29/2023 2:00 PM Medical Record Number: [id number] Patient Account Number: [id number] Date of Birth/Sex: Treating RN: 1934/03/24 (89 y.o. [name] [name] Other Clinician: Referring [name] [name]: Treating Gailya Tauer/Extender: [name] [name] Weeks in Treatment: 15 Visit Information History Since Last Visit All ordered tests and consults were completed: Yes Patient Arrived: Wheel Chair Added or deleted any medications: No Arrival Time: 14:01 Any new allergies or adverse reactions: No Accompanied By: caregiver Had a fall or experienced change in No Transfer Assistance: EasyPivot Patient Lift activities of daily living that may affect Patient Requires Transmission-Based Precautions: No risk of falls: Patient Has Alerts: No Signs or symptoms of abuse/neglect since last visito No Hospitalized since last visit: No Implantable device outside of the clinic excluding No cellular tissue based products placed in the center since last visit: Has Dressing in Place as Prescribed: Yes Pain Present Now: No Electronic Signature(s) Signed: 03/30/2023 3:48:27 PM By: Brenton Grills Entered By: Brenton Grills on 03/29/2023 11:03:28 -------------------------------------------------------------------------------- Clinic Level of Care Assessment Details Patient Name: Date of Service: [name], [name] 03/29/2023 2:00 PM Medical Record Number: [id number] Patient Account Number: [id number] Date of Birth/Sex: Treating RN: 06/09/1934 (89 y.o. [name] [name] Primary Care Tyneshia Stivers: [name] [name] Other Clinician: Referring [name] [name]: Treating [name] [name]/Extender: [name] [name] Weeks in Treatment: 15 Clinic Level of Care Assessment Items TOOL 4 Quantity Score X- 1  0 Use when only an EandM is performed on FOLLOW-UP visit ASSESSMENTS - Nursing Assessment / Reassessment X- 1 10 Reassessment of Co-morbidities (includes updates in patient status) X- 1 5 Reassessment of Adherence to Treatment Plan ASSESSMENTS - Wound and Skin A ssessment / Reassessment X - Simple Wound Assessment / Reassessment - one wound 1 5 X- 1 5 Complex Wound Assessment / Reassessment - multiple wounds X- 1 10 Dermatologic / Skin Assessment (not related to wound area) ASSESSMENTS - Focused Assessment []  - 0 Circumferential Edema Measurements - multi extremities []  - 0 Nutritional Assessment / Counseling / Intervention [name], [name] ([id number]) [id number] Page 2 of 10 []  - 0 Lower Extremity Assessment (monofilament, tuning fork, pulses) []  - 0 Peripheral Arterial Disease Assessment (using hand held doppler) ASSESSMENTS - Ostomy and/or Continence Assessment and Care []  - 0 Incontinence Assessment and Management []  - 0 Ostomy Care Assessment and Management (repouching, etc.) PROCESS - Coordination of Care []  - 0 Simple Patient / Family Education for ongoing care X- 1 20 Complex (extensive) Patient / Family Education for ongoing care X- 1 10 Staff obtains Chiropractor, Records, T Results / Process Orders est []  - 0 Staff telephones HHA, Nursing Homes / Clarify orders / etc []  - 0 Routine Transfer to another Facility (non-emergent condition) []  - 0 Routine Hospital Admission (non-emergent condition) []  - 0 New Admissions / Manufacturing engineer / Ordering NPWT Apligraf, etc. , []  - 0 Emergency Hospital Admission (emergent condition) X- 1 10 Simple Discharge Coordination []  - 0 Complex (extensive) Discharge Coordination PROCESS - Special Needs []  - 0 Pediatric / Minor Patient Management []  - 0 Isolation Patient Management []  - 0 Hearing / Language / Visual special needs []  - 0 Assessment of Community assistance (transportation,  D/C planning, etc.) []  - 0 Additional assistance / Altered mentation []  - 0 Support Surface(s) Assessment (bed, cushion, seat, etc.) INTERVENTIONS - Wound Cleansing / Measurement []  - 0 Simple Wound Cleansing -  one wound X- 2 5 Complex Wound Cleansing - multiple wounds X- 1 5 Wound Imaging (photographs - any number of wounds) []  - 0 Wound Tracing (instead of photographs) []  - 0 Simple Wound Measurement - one wound X- 2 5 Complex Wound Measurement - multiple wounds INTERVENTIONS - Wound Dressings []  - 0 Small Wound Dressing one or multiple wounds X- 2 15 Medium Wound Dressing one or multiple wounds []  - 0 Large Wound Dressing one or multiple wounds []  - 0 Application of Medications - topical []  - 0 Application of Medications - injection INTERVENTIONS - Miscellaneous []  - 0 External ear exam []  - 0 Specimen Collection (cultures, biopsies, blood, body fluids, etc.) []  - 0 Specimen(s) / Culture(s) sent or taken to Lab for analysis []  - 0 Patient Transfer (multiple staff / Nurse, adult / Similar devices) []  - 0 Simple Staple / Suture removal (25 or less) []  - 0 Complex Staple / Suture removal (26 or more) []  - 0 Hypo / Hyperglycemic Management (close monitor of Blood Glucose) [name], [name] ([id number]) [id number] Page 3 of 10 []  - 0 Ankle / Brachial Index (ABI) - do not check if billed separately X- 1 5 Vital Signs Has the patient been seen at the hospital within the last three years: Yes Total Score: 135 Level Of Care: New/Established - Level 4 Electronic Signature(s) Signed: 03/30/2023 3:48:27 PM By: Brenton Grills Entered By: Brenton Grills on 03/29/2023 11:50:52 -------------------------------------------------------------------------------- Encounter Discharge Information Details Patient Name: Date of Service: [name], [name] 03/29/2023 2:00 PM Medical Record Number: [id number] Patient Account Number: [id number] Date of Birth/Sex:  Treating RN: 16-Feb-1934 (89 y.o. [name] [name] Primary Care Morrill Bomkamp: [name] [name] Other Clinician: Referring Kyann Heydt: Treating [name] [name]/Extender: [name] [name] Weeks in Treatment: 15 Encounter Discharge Information Items Discharge Condition: Stable Ambulatory Status: Wheelchair Discharge Destination: Home Transportation: Private Auto Accompanied By: caregiver Schedule Follow-up Appointment: Yes Clinical Summary of Care: Patient Declined Electronic Signature(s) Signed: 03/30/2023 3:48:27 PM By: Brenton Grills Entered By: Brenton Grills on 03/29/2023 11:54:37 -------------------------------------------------------------------------------- Lower Extremity Assessment Details Patient Name: Date of Service: HA DEVYN, CORYELL. 03/29/2023 2:00 PM Medical Record Number: [id number] Patient Account Number: [id number] Date of Birth/Sex: Treating RN: 1934-06-06 (89 y.o. [name] [name] Primary Care Xaine Sansom: [name] [name] Other Clinician: Referring Kvon Mcilhenny: Treating My Madariaga/Extender: [name] [name] Weeks in Treatment: 15 Edema Assessment Assessed: [Left: No] [Right: No] Edema: [Left: No] [Right: No] Calf Left: Right: Point of Measurement: 29 cm From Medial Instep 28.2 cm 25.1 cm Ankle Left: Right: Point of Measurement: 10 cm From Medial Instep 18.1 cm 17 cm Vascular Assessment Left: [[id number] Page 4 of 10Right:] Pulses: Dorsalis Pedis Palpable: [id number] Page 4 of 10Yes Yes] Extremity colors, hair growth, and conditions: Extremity Color: [[id number] Page 4 of 10Normal] Hair Growth on Extremity: [[id number] Page 4 of 10No No] Temperature of Extremity: [[id number].pdf Page 4 of 10Cool Cool] Capillary Refill: [id number].pdf Page 4 of 10< 3 seconds < 3 seconds] Dependent Rubor:  [[id number] Page 4 of 10No No No No] Toe Nail Assessment Left: Right: Thick: Yes Yes Discolored: Yes Yes Deformed: Yes Yes Improper Length and Hygiene: Yes Yes Electronic Signature(s) Signed: 03/30/2023 3:48:27 PM By: Brenton Grills Entered By: Brenton Grills on 03/29/2023 11:08:08 -------------------------------------------------------------------------------- Multi Wound Chart Details Patient Name: Date of Service: [name] Spruce M. 03/29/2023 2:00 PM Medical Record Number: [id number] Patient Account Number: [id number] Date of Birth/Sex: Treating RN: [birth year] (89 y.o. F) Primary Care Syreeta Figler: [name] [name] Other Clinician: Referring Brysen Shankman: Treating Anajah Sterbenz/Extender: Mikey Bussing  Almira Coaster, Sneha Weeks in Treatment: 15 Vital Signs Height(in): 66 Pulse(bpm): 58 Weight(lbs): 110 Blood Pressure(mmHg): 159/85 Body Mass Index(BMI): 17.8 Temperature(F): 98.3 Respiratory Rate(breaths/min): 18 [3:Photos:] [N/A:N/A] Right, Distal T Great oe Left, Medial Foot N/A Wound Location: Gradually Appeared Gradually Appeared N/A Wounding Event: Diabetic Wound/Ulcer of the Lower Diabetic Wound/Ulcer of the Lower N/A Primary Etiology: Extremity Extremity Hypertension, Hypotension, Peripheral Hypertension, Hypotension, Peripheral N/A Comorbid History: Arterial Disease, Peripheral Venous Arterial Disease, Peripheral Venous Disease, Colitis, Type II Diabetes, Disease, Colitis, Type II Diabetes, Dementia, Seizure Disorder Dementia, Seizure Disorder 12/09/2021 12/09/2021 N/A Date Acquired: 15 15 N/A Weeks of Treatment: Open Open N/A Wound Status: No No N/A Wound Recurrence: 0.3x0.3x0.1 0.2x0.2x0.1 N/A Measurements L x W x D (cm) 0.071 0.031 N/A A (cm) : rea 0.007 0.003 N/A Volume (cm) : 88.70% 67.00% N/A % Reduction in Area: 88.90% 66.70% N/A % Reduction in Volume: Grade 1 Grade 1 N/A Classification: SAMORAH, SPRINGLE ([id number])  [id number] Page 5 of 10 Medium None Present N/A Exudate Amount: Serosanguineous N/A N/A Exudate Type: red, brown N/A N/A Exudate Color: Fibrotic scar, thickened scar Distinct, outline attached N/A Wound Margin: Small (1-33%) Medium (34-66%) N/A Granulation Amount: Red Red N/A Granulation Quality: Large (67-100%) Medium (34-66%) N/A Necrotic Amount: Fat Layer (Subcutaneous Tissue): Yes Fascia: No N/A Exposed Structures: Fascia: No Fat Layer (Subcutaneous Tissue): No Tendon: No Tendon: No Muscle: No Muscle: No Joint: No Joint: No Bone: No Bone: No None None N/A Epithelialization: No Abnormalities Noted No Abnormalities Noted N/A Periwound Skin Texture: No Abnormalities Noted No Abnormalities Noted N/A Periwound Skin Moisture: No Abnormalities Noted No Abnormalities Noted N/A Periwound Skin Color: No Abnormality No Abnormality N/A Temperature: Treatment Notes Electronic Signature(s) Signed: 03/29/2023 4:15:32 PM By: Geralyn Corwin DO Entered By: Geralyn Corwin on 03/29/2023 11:42:27 -------------------------------------------------------------------------------- Multi-Disciplinary Care Plan Details Patient Name: Date of Service: [name], [name] 03/29/2023 2:00 PM Medical Record Number: [id number] Patient Account Number: [id number] Date of Birth/Sex: Treating RN: 03-Nov-1933 (89 y.o. [name] [name] Primary Care Shirleyann Montero: [name] [name] Other Clinician: Referring Laporcha Marchesi: Treating Fardowsa Authier/Extender: [name] [name] Weeks in Treatment: 15 Active Inactive Wound/Skin Impairment Nursing Diagnoses: Impaired tissue integrity Goals: Patient/caregiver will verbalize understanding of skin care regimen Date Initiated: 12/10/2022 Target Resolution Date: 02/17/2023 Goal Status: Active Interventions: Assess ulceration(s) every visit Treatment Activities: Skin care regimen initiated : 12/10/2022 Notes: Electronic  Signature(s) Signed: 03/30/2023 3:48:27 PM By: Brenton Grills Entered By: Brenton Grills on 03/29/2023 11:12:03 [name] [name] ([id number]) [id number] Page 6 of 10 -------------------------------------------------------------------------------- Pain Assessment Details Patient Name: Date of Service: [name], [name] 03/29/2023 2:00 PM Medical Record Number: [id number] Patient Account Number: [id number] Date of Birth/Sex: Treating RN: 1934-04-23 (89 y.o. [name] [name] Primary Care Jaeger Trueheart: [name] [name] Other Clinician: Referring Afshin Chrystal: Treating Nellene Courtois/Extender: [name] [name] Weeks in Treatment: 15 Active Problems Location of Pain Severity and Description of Pain Patient Has Paino No Site Locations Pain Management and Medication Current Pain Management: Electronic Signature(s) Signed: 03/30/2023 3:48:27 PM By: Brenton Grills Entered By: Brenton Grills on 03/29/2023 11:04:11 -------------------------------------------------------------------------------- Patient/Caregiver Education Details Patient Name: Date of Service: [name] 9/9/2024andnbsp2:00 PM Medical Record Number: [id number] Patient Account Number: [id number] Date of Birth/Gender: Treating RN: 22-Mar-1934 (89 y.o. [name] [name] Primary Care Physician: [name] [name] Other Clinician: Referring Physician: Treating Physician/Extender: [name] [name] Weeks in Treatment: 15 Education Assessment Education Provided To: Patient and Caregiver Education Topics Provided Wound/Skin Impairment: Methods: Explain/Verbal Responses: State content correctly Electronic Signature(s) Signed: 03/30/2023 3:48:27  PM By: Brenton Grills Entered By: Brenton Grills on 03/29/2023 11:13:22 [name] [name] ([id number]) [id number] Page 7 of 10 -------------------------------------------------------------------------------- Wound Assessment  Details Patient Name: Date of Service: [name], [name] 03/29/2023 2:00 PM Medical Record Number: [id number] Patient Account Number: [id number] Date of Birth/Sex: Treating RN: 11-04-1933 (89 y.o. [name] [name] Primary Care Jerad Dunlap: [name] [name] Other Clinician: Referring Hawkin Charo: Treating Axel Frisk/Extender: [name] [name] Weeks in Treatment: 15 Wound Status Wound Number: 3 Primary Diabetic Wound/Ulcer of the Lower Extremity Etiology: Wound Location: Right, Distal T Great oe Wound Open Wounding Event: Gradually Appeared Status: Date Acquired: 12/09/2021 Comorbid Hypertension, Hypotension, Peripheral Arterial Disease, Peripheral Weeks Of Treatment: 15 History: Venous Disease, Colitis, Type II Diabetes, Dementia, Seizure Clustered Wound: No Disorder Photos Wound Measurements Length: (cm) 0.3 Width: (cm) 0.3 Depth: (cm) 0.1 Area: (cm) 0.071 Volume: (cm) 0.007 % Reduction in Area: 88.7% % Reduction in Volume: 88.9% Epithelialization: None Tunneling: No Undermining: No Wound Description Classification: Grade 1 Wound Margin: Fibrotic scar, thickened scar Exudate Amount: Medium Exudate Type: Serosanguineous Exudate Color: red, brown Foul Odor After Cleansing: No Slough/Fibrino Yes Wound Bed Granulation Amount: Small (1-33%) Exposed Structure Granulation Quality: Red Fascia Exposed: No Necrotic Amount: Large (67-100%) Fat Layer (Subcutaneous Tissue) Exposed: Yes Necrotic Quality: Adherent Slough Tendon Exposed: No Muscle Exposed: No Joint Exposed: No Bone Exposed: No Periwound Skin Texture Texture Color No Abnormalities Noted: Yes No Abnormalities Noted: Yes Moisture Temperature / Pain No Abnormalities Noted: Yes Temperature: No Abnormality Treatment Notes Wound #3 (Toe Great) Wound Laterality: Right, [address] JAHNAY, STURDIVANT ([id number]) [id number] Page 8 of 10 Soap and Water Discharge Instruction: May shower  and wash wound with dial antibacterial soap and water prior to dressing change. Wound Cleanser Discharge Instruction: Cleanse the wound with wound cleanser prior to applying a clean dressing using gauze sponges, not tissue or cotton balls. Peri-Wound Care Topical Primary Dressing Hydrofera Blue Ready Transfer Foam, 2.5x2.5 (in/in) Discharge Instruction: Apply directly to wound bed as directed Optifoam Non-Adhesive Dressing, 4x4 in Discharge Instruction: Apply to wound bed as instructed Santyl Ointment Discharge Instruction: Apply nickel thick amount to wound bed as instructed Secondary Dressing Secured With Conforming Stretch Gauze Bandage, Sterile 2x75 (in/in) Discharge Instruction: Secure with stretch gauze as directed. 68M Medipore Soft Cloth Surgical T 2x10 (in/yd) ape Discharge Instruction: Secure with tape as directed. Compression Wrap Compression Stockings Add-Ons Electronic Signature(s) Signed: 03/30/2023 3:48:27 PM By: Brenton Grills Entered By: Brenton Grills on 03/29/2023 11:19:11 -------------------------------------------------------------------------------- Wound Assessment Details Patient Name: Date of Service: [name], [name]. 03/29/2023 2:00 PM Medical Record Number: [id number] Patient Account Number: [id number] Date of Birth/Sex: Treating RN: Oct 15, 1933 (89 y.o. [name] [name] Primary Care Deanie Jupiter: [name] [name] Other Clinician: Referring Kloi Brodman: Treating Coleta Grosshans/Extender: [name] [name] Weeks in Treatment: 15 Wound Status Wound Number: 4 Primary Diabetic Wound/Ulcer of the Lower Extremity Etiology: Wound Location: Left, Medial Foot Wound Open Wounding Event: Gradually Appeared Status: Date Acquired: 12/09/2021 Comorbid Hypertension, Hypotension, Peripheral Arterial Disease, Peripheral Weeks Of Treatment: 15 History: Venous Disease, Colitis, Type II Diabetes, Dementia, Seizure Clustered Wound: No Disorder Photos [name], [name]  ([id number]) [id number] Page 9 of 10 Wound Measurements Length: (cm) 0.2 Width: (cm) 0.2 Depth: (cm) 0.1 Area: (cm) 0.031 Volume: (cm) 0.003 % Reduction in Area: 67% % Reduction in Volume: 66.7% Epithelialization: None Tunneling: No Undermining: No Wound Description Classification: Grade 1 Wound Margin: Distinct, outline attached Exudate Amount: None Present Foul Odor After Cleansing: No Slough/Fibrino Yes Wound Bed Granulation Amount: Medium (34-66%)  Exposed Structure Granulation Quality: Red Fascia Exposed: No Necrotic Amount: Medium (34-66%) Fat Layer (Subcutaneous Tissue) Exposed: No Necrotic Quality: Adherent Slough Tendon Exposed: No Muscle Exposed: No Joint Exposed: No Bone Exposed: No Periwound Skin Texture Texture Color No Abnormalities Noted: Yes No Abnormalities Noted: Yes Moisture Temperature / Pain No Abnormalities Noted: Yes Temperature: No Abnormality Treatment Notes Wound #4 (Foot) Wound Laterality: Left, Medial Cleanser Soap and Water Discharge Instruction: May shower and wash wound with dial antibacterial soap and water prior to dressing change. Wound Cleanser Discharge Instruction: Cleanse the wound with wound cleanser prior to applying a clean dressing using gauze sponges, not tissue or cotton balls. Peri-Wound Care Topical Primary Dressing Hydrofera Blue Ready Transfer Foam, 2.5x2.5 (in/in) Discharge Instruction: Apply directly to wound bed as directed Santyl Ointment Discharge Instruction: Apply nickel thick amount to wound bed as instructed Secondary Dressing Bordered Gauze, 2x2 in Discharge Instruction: Apply over primary dressing as directed. Secured With Compression Wrap Compression Stockings Add-Ons Electronic Signature(s) Signed: 03/30/2023 3:48:27 PM By: Brenton Grills Entered By: Brenton Grills on 03/29/2023  11:20:02 -------------------------------------------------------------------------------- Vitals Details Patient Name: Date of Service: [name], [name] M. 03/29/2023 2:00 PM [name] [name] ([id number]) [id number] Page 10 of 10 Medical Record Number: [id number] Patient Account Number: [id number] Date of Birth/Sex: Treating RN: [birth year] (89 y.o. [name] [name] Primary Care Jayland Null: [name] [name] Other Clinician: Referring Freddrick Gladson: Treating Ilithyia Titzer/Extender: [name] [name] Weeks in Treatment: 15 Vital Signs Time Taken: 14:03 Temperature (F): 98.3 Height (in): 66 Pulse (bpm): 58 Weight (lbs): 110 Respiratory Rate (breaths/min): 18 Body Mass Index (BMI): 17.8 Blood Pressure (mmHg): 159/85 Reference Range: 80 - 120 mg / dl Electronic Signature(s) Signed: 03/30/2023 3:48:27 PM By: Brenton Grills Entered By: Brenton Grills on 03/29/2023 11:04:03

## 2023-04-01 DIAGNOSIS — E11649 Type 2 diabetes mellitus with hypoglycemia without coma: Secondary | ICD-10-CM | POA: Diagnosis not present

## 2023-04-01 DIAGNOSIS — E039 Hypothyroidism, unspecified: Secondary | ICD-10-CM | POA: Diagnosis not present

## 2023-04-01 DIAGNOSIS — F0280 Dementia in other diseases classified elsewhere without behavioral disturbance: Secondary | ICD-10-CM | POA: Diagnosis not present

## 2023-04-01 DIAGNOSIS — E871 Hypo-osmolality and hyponatremia: Secondary | ICD-10-CM | POA: Diagnosis not present

## 2023-04-01 DIAGNOSIS — L89891 Pressure ulcer of other site, stage 1: Secondary | ICD-10-CM | POA: Diagnosis not present

## 2023-04-01 DIAGNOSIS — I5032 Chronic diastolic (congestive) heart failure: Secondary | ICD-10-CM | POA: Diagnosis not present

## 2023-04-01 DIAGNOSIS — I700 Atherosclerosis of aorta: Secondary | ICD-10-CM | POA: Diagnosis not present

## 2023-04-01 DIAGNOSIS — Z87440 Personal history of urinary (tract) infections: Secondary | ICD-10-CM | POA: Diagnosis not present

## 2023-04-01 DIAGNOSIS — Z8701 Personal history of pneumonia (recurrent): Secondary | ICD-10-CM | POA: Diagnosis not present

## 2023-04-01 DIAGNOSIS — N182 Chronic kidney disease, stage 2 (mild): Secondary | ICD-10-CM | POA: Diagnosis not present

## 2023-04-01 DIAGNOSIS — E1151 Type 2 diabetes mellitus with diabetic peripheral angiopathy without gangrene: Secondary | ICD-10-CM | POA: Diagnosis not present

## 2023-04-01 DIAGNOSIS — B370 Candidal stomatitis: Secondary | ICD-10-CM | POA: Diagnosis not present

## 2023-04-01 DIAGNOSIS — R64 Cachexia: Secondary | ICD-10-CM | POA: Diagnosis not present

## 2023-04-01 DIAGNOSIS — E1122 Type 2 diabetes mellitus with diabetic chronic kidney disease: Secondary | ICD-10-CM | POA: Diagnosis not present

## 2023-04-01 DIAGNOSIS — Z993 Dependence on wheelchair: Secondary | ICD-10-CM | POA: Diagnosis not present

## 2023-04-01 DIAGNOSIS — Z7902 Long term (current) use of antithrombotics/antiplatelets: Secondary | ICD-10-CM | POA: Diagnosis not present

## 2023-04-01 DIAGNOSIS — Z7982 Long term (current) use of aspirin: Secondary | ICD-10-CM | POA: Diagnosis not present

## 2023-04-01 DIAGNOSIS — E7800 Pure hypercholesterolemia, unspecified: Secondary | ICD-10-CM | POA: Diagnosis not present

## 2023-04-01 DIAGNOSIS — E440 Moderate protein-calorie malnutrition: Secondary | ICD-10-CM | POA: Diagnosis not present

## 2023-04-01 DIAGNOSIS — M810 Age-related osteoporosis without current pathological fracture: Secondary | ICD-10-CM | POA: Diagnosis not present

## 2023-04-01 DIAGNOSIS — D649 Anemia, unspecified: Secondary | ICD-10-CM | POA: Diagnosis not present

## 2023-04-01 DIAGNOSIS — L89152 Pressure ulcer of sacral region, stage 2: Secondary | ICD-10-CM | POA: Diagnosis not present

## 2023-04-01 DIAGNOSIS — U071 COVID-19: Secondary | ICD-10-CM | POA: Diagnosis not present

## 2023-04-01 DIAGNOSIS — G8220 Paraplegia, unspecified: Secondary | ICD-10-CM | POA: Diagnosis not present

## 2023-04-01 DIAGNOSIS — Z794 Long term (current) use of insulin: Secondary | ICD-10-CM | POA: Diagnosis not present

## 2023-04-01 DIAGNOSIS — E1142 Type 2 diabetes mellitus with diabetic polyneuropathy: Secondary | ICD-10-CM | POA: Diagnosis not present

## 2023-04-01 DIAGNOSIS — E1165 Type 2 diabetes mellitus with hyperglycemia: Secondary | ICD-10-CM | POA: Diagnosis not present

## 2023-04-02 DIAGNOSIS — R278 Other lack of coordination: Secondary | ICD-10-CM | POA: Diagnosis not present

## 2023-04-02 DIAGNOSIS — E1122 Type 2 diabetes mellitus with diabetic chronic kidney disease: Secondary | ICD-10-CM | POA: Diagnosis not present

## 2023-04-02 DIAGNOSIS — Z87440 Personal history of urinary (tract) infections: Secondary | ICD-10-CM | POA: Diagnosis not present

## 2023-04-02 DIAGNOSIS — M6281 Muscle weakness (generalized): Secondary | ICD-10-CM | POA: Diagnosis not present

## 2023-04-02 DIAGNOSIS — L89891 Pressure ulcer of other site, stage 1: Secondary | ICD-10-CM | POA: Diagnosis not present

## 2023-04-02 DIAGNOSIS — E039 Hypothyroidism, unspecified: Secondary | ICD-10-CM | POA: Diagnosis not present

## 2023-04-02 DIAGNOSIS — N182 Chronic kidney disease, stage 2 (mild): Secondary | ICD-10-CM | POA: Diagnosis not present

## 2023-04-02 DIAGNOSIS — L89152 Pressure ulcer of sacral region, stage 2: Secondary | ICD-10-CM | POA: Diagnosis not present

## 2023-04-02 DIAGNOSIS — Z993 Dependence on wheelchair: Secondary | ICD-10-CM | POA: Diagnosis not present

## 2023-04-02 DIAGNOSIS — E440 Moderate protein-calorie malnutrition: Secondary | ICD-10-CM | POA: Diagnosis not present

## 2023-04-02 DIAGNOSIS — E11649 Type 2 diabetes mellitus with hypoglycemia without coma: Secondary | ICD-10-CM | POA: Diagnosis not present

## 2023-04-02 DIAGNOSIS — Z7982 Long term (current) use of aspirin: Secondary | ICD-10-CM | POA: Diagnosis not present

## 2023-04-02 DIAGNOSIS — F0280 Dementia in other diseases classified elsewhere without behavioral disturbance: Secondary | ICD-10-CM | POA: Diagnosis not present

## 2023-04-02 DIAGNOSIS — M810 Age-related osteoporosis without current pathological fracture: Secondary | ICD-10-CM | POA: Diagnosis not present

## 2023-04-02 DIAGNOSIS — G8220 Paraplegia, unspecified: Secondary | ICD-10-CM | POA: Diagnosis not present

## 2023-04-02 DIAGNOSIS — D649 Anemia, unspecified: Secondary | ICD-10-CM | POA: Diagnosis not present

## 2023-04-02 DIAGNOSIS — M150 Primary generalized (osteo)arthritis: Secondary | ICD-10-CM | POA: Diagnosis not present

## 2023-04-02 DIAGNOSIS — E1151 Type 2 diabetes mellitus with diabetic peripheral angiopathy without gangrene: Secondary | ICD-10-CM | POA: Diagnosis not present

## 2023-04-02 DIAGNOSIS — Z7902 Long term (current) use of antithrombotics/antiplatelets: Secondary | ICD-10-CM | POA: Diagnosis not present

## 2023-04-02 DIAGNOSIS — U071 COVID-19: Secondary | ICD-10-CM | POA: Diagnosis not present

## 2023-04-02 DIAGNOSIS — I5032 Chronic diastolic (congestive) heart failure: Secondary | ICD-10-CM | POA: Diagnosis not present

## 2023-04-02 DIAGNOSIS — Z794 Long term (current) use of insulin: Secondary | ICD-10-CM | POA: Diagnosis not present

## 2023-04-02 DIAGNOSIS — I700 Atherosclerosis of aorta: Secondary | ICD-10-CM | POA: Diagnosis not present

## 2023-04-05 DIAGNOSIS — Z7902 Long term (current) use of antithrombotics/antiplatelets: Secondary | ICD-10-CM | POA: Diagnosis not present

## 2023-04-05 DIAGNOSIS — E1122 Type 2 diabetes mellitus with diabetic chronic kidney disease: Secondary | ICD-10-CM | POA: Diagnosis not present

## 2023-04-05 DIAGNOSIS — M6281 Muscle weakness (generalized): Secondary | ICD-10-CM | POA: Diagnosis not present

## 2023-04-05 DIAGNOSIS — E440 Moderate protein-calorie malnutrition: Secondary | ICD-10-CM | POA: Diagnosis not present

## 2023-04-05 DIAGNOSIS — I5032 Chronic diastolic (congestive) heart failure: Secondary | ICD-10-CM | POA: Diagnosis not present

## 2023-04-05 DIAGNOSIS — N182 Chronic kidney disease, stage 2 (mild): Secondary | ICD-10-CM | POA: Diagnosis not present

## 2023-04-05 DIAGNOSIS — M810 Age-related osteoporosis without current pathological fracture: Secondary | ICD-10-CM | POA: Diagnosis not present

## 2023-04-05 DIAGNOSIS — Z993 Dependence on wheelchair: Secondary | ICD-10-CM | POA: Diagnosis not present

## 2023-04-05 DIAGNOSIS — R278 Other lack of coordination: Secondary | ICD-10-CM | POA: Diagnosis not present

## 2023-04-05 DIAGNOSIS — F0280 Dementia in other diseases classified elsewhere without behavioral disturbance: Secondary | ICD-10-CM | POA: Diagnosis not present

## 2023-04-05 DIAGNOSIS — Z794 Long term (current) use of insulin: Secondary | ICD-10-CM | POA: Diagnosis not present

## 2023-04-05 DIAGNOSIS — Z7982 Long term (current) use of aspirin: Secondary | ICD-10-CM | POA: Diagnosis not present

## 2023-04-05 DIAGNOSIS — G8220 Paraplegia, unspecified: Secondary | ICD-10-CM | POA: Diagnosis not present

## 2023-04-05 DIAGNOSIS — Z87440 Personal history of urinary (tract) infections: Secondary | ICD-10-CM | POA: Diagnosis not present

## 2023-04-05 DIAGNOSIS — I700 Atherosclerosis of aorta: Secondary | ICD-10-CM | POA: Diagnosis not present

## 2023-04-05 DIAGNOSIS — U071 COVID-19: Secondary | ICD-10-CM | POA: Diagnosis not present

## 2023-04-05 DIAGNOSIS — L89152 Pressure ulcer of sacral region, stage 2: Secondary | ICD-10-CM | POA: Diagnosis not present

## 2023-04-05 DIAGNOSIS — M150 Primary generalized (osteo)arthritis: Secondary | ICD-10-CM | POA: Diagnosis not present

## 2023-04-05 DIAGNOSIS — E11649 Type 2 diabetes mellitus with hypoglycemia without coma: Secondary | ICD-10-CM | POA: Diagnosis not present

## 2023-04-05 DIAGNOSIS — E1151 Type 2 diabetes mellitus with diabetic peripheral angiopathy without gangrene: Secondary | ICD-10-CM | POA: Diagnosis not present

## 2023-04-05 DIAGNOSIS — E039 Hypothyroidism, unspecified: Secondary | ICD-10-CM | POA: Diagnosis not present

## 2023-04-05 DIAGNOSIS — L89891 Pressure ulcer of other site, stage 1: Secondary | ICD-10-CM | POA: Diagnosis not present

## 2023-04-05 DIAGNOSIS — D649 Anemia, unspecified: Secondary | ICD-10-CM | POA: Diagnosis not present

## 2023-04-06 ENCOUNTER — Other Ambulatory Visit: Payer: Self-pay

## 2023-04-06 DIAGNOSIS — M6281 Muscle weakness (generalized): Secondary | ICD-10-CM | POA: Diagnosis not present

## 2023-04-06 DIAGNOSIS — L89152 Pressure ulcer of sacral region, stage 2: Secondary | ICD-10-CM | POA: Diagnosis not present

## 2023-04-06 DIAGNOSIS — I7025 Atherosclerosis of native arteries of other extremities with ulceration: Secondary | ICD-10-CM

## 2023-04-06 DIAGNOSIS — Z794 Long term (current) use of insulin: Secondary | ICD-10-CM | POA: Diagnosis not present

## 2023-04-06 DIAGNOSIS — E1122 Type 2 diabetes mellitus with diabetic chronic kidney disease: Secondary | ICD-10-CM | POA: Diagnosis not present

## 2023-04-06 DIAGNOSIS — Z7902 Long term (current) use of antithrombotics/antiplatelets: Secondary | ICD-10-CM | POA: Diagnosis not present

## 2023-04-06 DIAGNOSIS — Z87440 Personal history of urinary (tract) infections: Secondary | ICD-10-CM | POA: Diagnosis not present

## 2023-04-06 DIAGNOSIS — R278 Other lack of coordination: Secondary | ICD-10-CM | POA: Diagnosis not present

## 2023-04-06 DIAGNOSIS — E440 Moderate protein-calorie malnutrition: Secondary | ICD-10-CM | POA: Diagnosis not present

## 2023-04-06 DIAGNOSIS — M150 Primary generalized (osteo)arthritis: Secondary | ICD-10-CM | POA: Diagnosis not present

## 2023-04-06 DIAGNOSIS — Z993 Dependence on wheelchair: Secondary | ICD-10-CM | POA: Diagnosis not present

## 2023-04-06 DIAGNOSIS — M810 Age-related osteoporosis without current pathological fracture: Secondary | ICD-10-CM | POA: Diagnosis not present

## 2023-04-06 DIAGNOSIS — L89891 Pressure ulcer of other site, stage 1: Secondary | ICD-10-CM | POA: Diagnosis not present

## 2023-04-06 DIAGNOSIS — E1151 Type 2 diabetes mellitus with diabetic peripheral angiopathy without gangrene: Secondary | ICD-10-CM | POA: Diagnosis not present

## 2023-04-06 DIAGNOSIS — I700 Atherosclerosis of aorta: Secondary | ICD-10-CM | POA: Diagnosis not present

## 2023-04-06 DIAGNOSIS — E039 Hypothyroidism, unspecified: Secondary | ICD-10-CM | POA: Diagnosis not present

## 2023-04-06 DIAGNOSIS — G8220 Paraplegia, unspecified: Secondary | ICD-10-CM | POA: Diagnosis not present

## 2023-04-06 DIAGNOSIS — D649 Anemia, unspecified: Secondary | ICD-10-CM | POA: Diagnosis not present

## 2023-04-06 DIAGNOSIS — I5032 Chronic diastolic (congestive) heart failure: Secondary | ICD-10-CM | POA: Diagnosis not present

## 2023-04-06 DIAGNOSIS — F0280 Dementia in other diseases classified elsewhere without behavioral disturbance: Secondary | ICD-10-CM | POA: Diagnosis not present

## 2023-04-06 DIAGNOSIS — Z7982 Long term (current) use of aspirin: Secondary | ICD-10-CM | POA: Diagnosis not present

## 2023-04-06 DIAGNOSIS — U071 COVID-19: Secondary | ICD-10-CM | POA: Diagnosis not present

## 2023-04-06 DIAGNOSIS — I739 Peripheral vascular disease, unspecified: Secondary | ICD-10-CM

## 2023-04-06 DIAGNOSIS — N182 Chronic kidney disease, stage 2 (mild): Secondary | ICD-10-CM | POA: Diagnosis not present

## 2023-04-06 DIAGNOSIS — E11649 Type 2 diabetes mellitus with hypoglycemia without coma: Secondary | ICD-10-CM | POA: Diagnosis not present

## 2023-04-07 DIAGNOSIS — E1151 Type 2 diabetes mellitus with diabetic peripheral angiopathy without gangrene: Secondary | ICD-10-CM | POA: Diagnosis not present

## 2023-04-07 DIAGNOSIS — R278 Other lack of coordination: Secondary | ICD-10-CM | POA: Diagnosis not present

## 2023-04-07 DIAGNOSIS — E440 Moderate protein-calorie malnutrition: Secondary | ICD-10-CM | POA: Diagnosis not present

## 2023-04-07 DIAGNOSIS — L89152 Pressure ulcer of sacral region, stage 2: Secondary | ICD-10-CM | POA: Diagnosis not present

## 2023-04-07 DIAGNOSIS — U071 COVID-19: Secondary | ICD-10-CM | POA: Diagnosis not present

## 2023-04-07 DIAGNOSIS — L89891 Pressure ulcer of other site, stage 1: Secondary | ICD-10-CM | POA: Diagnosis not present

## 2023-04-07 DIAGNOSIS — Z87440 Personal history of urinary (tract) infections: Secondary | ICD-10-CM | POA: Diagnosis not present

## 2023-04-07 DIAGNOSIS — F0280 Dementia in other diseases classified elsewhere without behavioral disturbance: Secondary | ICD-10-CM | POA: Diagnosis not present

## 2023-04-07 DIAGNOSIS — E11649 Type 2 diabetes mellitus with hypoglycemia without coma: Secondary | ICD-10-CM | POA: Diagnosis not present

## 2023-04-07 DIAGNOSIS — N182 Chronic kidney disease, stage 2 (mild): Secondary | ICD-10-CM | POA: Diagnosis not present

## 2023-04-07 DIAGNOSIS — M6281 Muscle weakness (generalized): Secondary | ICD-10-CM | POA: Diagnosis not present

## 2023-04-07 DIAGNOSIS — Z993 Dependence on wheelchair: Secondary | ICD-10-CM | POA: Diagnosis not present

## 2023-04-07 DIAGNOSIS — I700 Atherosclerosis of aorta: Secondary | ICD-10-CM | POA: Diagnosis not present

## 2023-04-07 DIAGNOSIS — M150 Primary generalized (osteo)arthritis: Secondary | ICD-10-CM | POA: Diagnosis not present

## 2023-04-07 DIAGNOSIS — D649 Anemia, unspecified: Secondary | ICD-10-CM | POA: Diagnosis not present

## 2023-04-07 DIAGNOSIS — Z7982 Long term (current) use of aspirin: Secondary | ICD-10-CM | POA: Diagnosis not present

## 2023-04-07 DIAGNOSIS — I5032 Chronic diastolic (congestive) heart failure: Secondary | ICD-10-CM | POA: Diagnosis not present

## 2023-04-07 DIAGNOSIS — Z794 Long term (current) use of insulin: Secondary | ICD-10-CM | POA: Diagnosis not present

## 2023-04-07 DIAGNOSIS — M810 Age-related osteoporosis without current pathological fracture: Secondary | ICD-10-CM | POA: Diagnosis not present

## 2023-04-07 DIAGNOSIS — Z7902 Long term (current) use of antithrombotics/antiplatelets: Secondary | ICD-10-CM | POA: Diagnosis not present

## 2023-04-07 DIAGNOSIS — E039 Hypothyroidism, unspecified: Secondary | ICD-10-CM | POA: Diagnosis not present

## 2023-04-07 DIAGNOSIS — G8220 Paraplegia, unspecified: Secondary | ICD-10-CM | POA: Diagnosis not present

## 2023-04-07 DIAGNOSIS — E1122 Type 2 diabetes mellitus with diabetic chronic kidney disease: Secondary | ICD-10-CM | POA: Diagnosis not present

## 2023-04-08 DIAGNOSIS — Z7902 Long term (current) use of antithrombotics/antiplatelets: Secondary | ICD-10-CM | POA: Diagnosis not present

## 2023-04-08 DIAGNOSIS — E039 Hypothyroidism, unspecified: Secondary | ICD-10-CM | POA: Diagnosis not present

## 2023-04-08 DIAGNOSIS — D649 Anemia, unspecified: Secondary | ICD-10-CM | POA: Diagnosis not present

## 2023-04-08 DIAGNOSIS — N182 Chronic kidney disease, stage 2 (mild): Secondary | ICD-10-CM | POA: Diagnosis not present

## 2023-04-08 DIAGNOSIS — I5032 Chronic diastolic (congestive) heart failure: Secondary | ICD-10-CM | POA: Diagnosis not present

## 2023-04-08 DIAGNOSIS — E440 Moderate protein-calorie malnutrition: Secondary | ICD-10-CM | POA: Diagnosis not present

## 2023-04-08 DIAGNOSIS — L89891 Pressure ulcer of other site, stage 1: Secondary | ICD-10-CM | POA: Diagnosis not present

## 2023-04-08 DIAGNOSIS — I700 Atherosclerosis of aorta: Secondary | ICD-10-CM | POA: Diagnosis not present

## 2023-04-08 DIAGNOSIS — G8220 Paraplegia, unspecified: Secondary | ICD-10-CM | POA: Diagnosis not present

## 2023-04-08 DIAGNOSIS — Z794 Long term (current) use of insulin: Secondary | ICD-10-CM | POA: Diagnosis not present

## 2023-04-08 DIAGNOSIS — Z993 Dependence on wheelchair: Secondary | ICD-10-CM | POA: Diagnosis not present

## 2023-04-08 DIAGNOSIS — Z7982 Long term (current) use of aspirin: Secondary | ICD-10-CM | POA: Diagnosis not present

## 2023-04-08 DIAGNOSIS — E11649 Type 2 diabetes mellitus with hypoglycemia without coma: Secondary | ICD-10-CM | POA: Diagnosis not present

## 2023-04-08 DIAGNOSIS — U071 COVID-19: Secondary | ICD-10-CM | POA: Diagnosis not present

## 2023-04-08 DIAGNOSIS — R278 Other lack of coordination: Secondary | ICD-10-CM | POA: Diagnosis not present

## 2023-04-08 DIAGNOSIS — Z87440 Personal history of urinary (tract) infections: Secondary | ICD-10-CM | POA: Diagnosis not present

## 2023-04-08 DIAGNOSIS — L89152 Pressure ulcer of sacral region, stage 2: Secondary | ICD-10-CM | POA: Diagnosis not present

## 2023-04-08 DIAGNOSIS — M810 Age-related osteoporosis without current pathological fracture: Secondary | ICD-10-CM | POA: Diagnosis not present

## 2023-04-08 DIAGNOSIS — F0280 Dementia in other diseases classified elsewhere without behavioral disturbance: Secondary | ICD-10-CM | POA: Diagnosis not present

## 2023-04-08 DIAGNOSIS — M150 Primary generalized (osteo)arthritis: Secondary | ICD-10-CM | POA: Diagnosis not present

## 2023-04-08 DIAGNOSIS — M6281 Muscle weakness (generalized): Secondary | ICD-10-CM | POA: Diagnosis not present

## 2023-04-08 DIAGNOSIS — E1122 Type 2 diabetes mellitus with diabetic chronic kidney disease: Secondary | ICD-10-CM | POA: Diagnosis not present

## 2023-04-08 DIAGNOSIS — E1151 Type 2 diabetes mellitus with diabetic peripheral angiopathy without gangrene: Secondary | ICD-10-CM | POA: Diagnosis not present

## 2023-04-09 DIAGNOSIS — E039 Hypothyroidism, unspecified: Secondary | ICD-10-CM | POA: Diagnosis not present

## 2023-04-09 DIAGNOSIS — D649 Anemia, unspecified: Secondary | ICD-10-CM | POA: Diagnosis not present

## 2023-04-09 DIAGNOSIS — Z7982 Long term (current) use of aspirin: Secondary | ICD-10-CM | POA: Diagnosis not present

## 2023-04-09 DIAGNOSIS — Z7902 Long term (current) use of antithrombotics/antiplatelets: Secondary | ICD-10-CM | POA: Diagnosis not present

## 2023-04-09 DIAGNOSIS — Z794 Long term (current) use of insulin: Secondary | ICD-10-CM | POA: Diagnosis not present

## 2023-04-09 DIAGNOSIS — Z87440 Personal history of urinary (tract) infections: Secondary | ICD-10-CM | POA: Diagnosis not present

## 2023-04-09 DIAGNOSIS — L89152 Pressure ulcer of sacral region, stage 2: Secondary | ICD-10-CM | POA: Diagnosis not present

## 2023-04-09 DIAGNOSIS — N182 Chronic kidney disease, stage 2 (mild): Secondary | ICD-10-CM | POA: Diagnosis not present

## 2023-04-09 DIAGNOSIS — R278 Other lack of coordination: Secondary | ICD-10-CM | POA: Diagnosis not present

## 2023-04-09 DIAGNOSIS — E1122 Type 2 diabetes mellitus with diabetic chronic kidney disease: Secondary | ICD-10-CM | POA: Diagnosis not present

## 2023-04-09 DIAGNOSIS — I5032 Chronic diastolic (congestive) heart failure: Secondary | ICD-10-CM | POA: Diagnosis not present

## 2023-04-09 DIAGNOSIS — G8220 Paraplegia, unspecified: Secondary | ICD-10-CM | POA: Diagnosis not present

## 2023-04-09 DIAGNOSIS — M150 Primary generalized (osteo)arthritis: Secondary | ICD-10-CM | POA: Diagnosis not present

## 2023-04-09 DIAGNOSIS — I700 Atherosclerosis of aorta: Secondary | ICD-10-CM | POA: Diagnosis not present

## 2023-04-09 DIAGNOSIS — E440 Moderate protein-calorie malnutrition: Secondary | ICD-10-CM | POA: Diagnosis not present

## 2023-04-09 DIAGNOSIS — Z993 Dependence on wheelchair: Secondary | ICD-10-CM | POA: Diagnosis not present

## 2023-04-09 DIAGNOSIS — F0280 Dementia in other diseases classified elsewhere without behavioral disturbance: Secondary | ICD-10-CM | POA: Diagnosis not present

## 2023-04-09 DIAGNOSIS — M6281 Muscle weakness (generalized): Secondary | ICD-10-CM | POA: Diagnosis not present

## 2023-04-09 DIAGNOSIS — L89891 Pressure ulcer of other site, stage 1: Secondary | ICD-10-CM | POA: Diagnosis not present

## 2023-04-09 DIAGNOSIS — E1151 Type 2 diabetes mellitus with diabetic peripheral angiopathy without gangrene: Secondary | ICD-10-CM | POA: Diagnosis not present

## 2023-04-09 DIAGNOSIS — M810 Age-related osteoporosis without current pathological fracture: Secondary | ICD-10-CM | POA: Diagnosis not present

## 2023-04-09 DIAGNOSIS — U071 COVID-19: Secondary | ICD-10-CM | POA: Diagnosis not present

## 2023-04-09 DIAGNOSIS — E11649 Type 2 diabetes mellitus with hypoglycemia without coma: Secondary | ICD-10-CM | POA: Diagnosis not present

## 2023-04-12 ENCOUNTER — Ambulatory Visit (HOSPITAL_BASED_OUTPATIENT_CLINIC_OR_DEPARTMENT_OTHER): Payer: Medicare HMO | Admitting: Internal Medicine

## 2023-04-13 DIAGNOSIS — Z7902 Long term (current) use of antithrombotics/antiplatelets: Secondary | ICD-10-CM | POA: Diagnosis not present

## 2023-04-13 DIAGNOSIS — M6281 Muscle weakness (generalized): Secondary | ICD-10-CM | POA: Diagnosis not present

## 2023-04-13 DIAGNOSIS — Z87440 Personal history of urinary (tract) infections: Secondary | ICD-10-CM | POA: Diagnosis not present

## 2023-04-13 DIAGNOSIS — E1151 Type 2 diabetes mellitus with diabetic peripheral angiopathy without gangrene: Secondary | ICD-10-CM | POA: Diagnosis not present

## 2023-04-13 DIAGNOSIS — L89891 Pressure ulcer of other site, stage 1: Secondary | ICD-10-CM | POA: Diagnosis not present

## 2023-04-13 DIAGNOSIS — U071 COVID-19: Secondary | ICD-10-CM | POA: Diagnosis not present

## 2023-04-13 DIAGNOSIS — R278 Other lack of coordination: Secondary | ICD-10-CM | POA: Diagnosis not present

## 2023-04-13 DIAGNOSIS — N182 Chronic kidney disease, stage 2 (mild): Secondary | ICD-10-CM | POA: Diagnosis not present

## 2023-04-13 DIAGNOSIS — E1122 Type 2 diabetes mellitus with diabetic chronic kidney disease: Secondary | ICD-10-CM | POA: Diagnosis not present

## 2023-04-13 DIAGNOSIS — L89152 Pressure ulcer of sacral region, stage 2: Secondary | ICD-10-CM | POA: Diagnosis not present

## 2023-04-13 DIAGNOSIS — Z993 Dependence on wheelchair: Secondary | ICD-10-CM | POA: Diagnosis not present

## 2023-04-13 DIAGNOSIS — I700 Atherosclerosis of aorta: Secondary | ICD-10-CM | POA: Diagnosis not present

## 2023-04-13 DIAGNOSIS — Z794 Long term (current) use of insulin: Secondary | ICD-10-CM | POA: Diagnosis not present

## 2023-04-13 DIAGNOSIS — D649 Anemia, unspecified: Secondary | ICD-10-CM | POA: Diagnosis not present

## 2023-04-13 DIAGNOSIS — E440 Moderate protein-calorie malnutrition: Secondary | ICD-10-CM | POA: Diagnosis not present

## 2023-04-13 DIAGNOSIS — F0280 Dementia in other diseases classified elsewhere without behavioral disturbance: Secondary | ICD-10-CM | POA: Diagnosis not present

## 2023-04-13 DIAGNOSIS — G8220 Paraplegia, unspecified: Secondary | ICD-10-CM | POA: Diagnosis not present

## 2023-04-13 DIAGNOSIS — M150 Primary generalized (osteo)arthritis: Secondary | ICD-10-CM | POA: Diagnosis not present

## 2023-04-13 DIAGNOSIS — I5032 Chronic diastolic (congestive) heart failure: Secondary | ICD-10-CM | POA: Diagnosis not present

## 2023-04-13 DIAGNOSIS — Z7982 Long term (current) use of aspirin: Secondary | ICD-10-CM | POA: Diagnosis not present

## 2023-04-13 DIAGNOSIS — E11649 Type 2 diabetes mellitus with hypoglycemia without coma: Secondary | ICD-10-CM | POA: Diagnosis not present

## 2023-04-13 DIAGNOSIS — E039 Hypothyroidism, unspecified: Secondary | ICD-10-CM | POA: Diagnosis not present

## 2023-04-13 DIAGNOSIS — M810 Age-related osteoporosis without current pathological fracture: Secondary | ICD-10-CM | POA: Diagnosis not present

## 2023-04-15 DIAGNOSIS — M810 Age-related osteoporosis without current pathological fracture: Secondary | ICD-10-CM | POA: Diagnosis not present

## 2023-04-15 DIAGNOSIS — M6281 Muscle weakness (generalized): Secondary | ICD-10-CM | POA: Diagnosis not present

## 2023-04-15 DIAGNOSIS — U071 COVID-19: Secondary | ICD-10-CM | POA: Diagnosis not present

## 2023-04-15 DIAGNOSIS — N182 Chronic kidney disease, stage 2 (mild): Secondary | ICD-10-CM | POA: Diagnosis not present

## 2023-04-15 DIAGNOSIS — Z7902 Long term (current) use of antithrombotics/antiplatelets: Secondary | ICD-10-CM | POA: Diagnosis not present

## 2023-04-15 DIAGNOSIS — F0280 Dementia in other diseases classified elsewhere without behavioral disturbance: Secondary | ICD-10-CM | POA: Diagnosis not present

## 2023-04-15 DIAGNOSIS — Z993 Dependence on wheelchair: Secondary | ICD-10-CM | POA: Diagnosis not present

## 2023-04-15 DIAGNOSIS — I5032 Chronic diastolic (congestive) heart failure: Secondary | ICD-10-CM | POA: Diagnosis not present

## 2023-04-15 DIAGNOSIS — R278 Other lack of coordination: Secondary | ICD-10-CM | POA: Diagnosis not present

## 2023-04-15 DIAGNOSIS — Z794 Long term (current) use of insulin: Secondary | ICD-10-CM | POA: Diagnosis not present

## 2023-04-15 DIAGNOSIS — E1151 Type 2 diabetes mellitus with diabetic peripheral angiopathy without gangrene: Secondary | ICD-10-CM | POA: Diagnosis not present

## 2023-04-15 DIAGNOSIS — Z7982 Long term (current) use of aspirin: Secondary | ICD-10-CM | POA: Diagnosis not present

## 2023-04-15 DIAGNOSIS — E11649 Type 2 diabetes mellitus with hypoglycemia without coma: Secondary | ICD-10-CM | POA: Diagnosis not present

## 2023-04-15 DIAGNOSIS — I700 Atherosclerosis of aorta: Secondary | ICD-10-CM | POA: Diagnosis not present

## 2023-04-15 DIAGNOSIS — Z87440 Personal history of urinary (tract) infections: Secondary | ICD-10-CM | POA: Diagnosis not present

## 2023-04-15 DIAGNOSIS — N390 Urinary tract infection, site not specified: Secondary | ICD-10-CM | POA: Diagnosis not present

## 2023-04-15 DIAGNOSIS — M150 Primary generalized (osteo)arthritis: Secondary | ICD-10-CM | POA: Diagnosis not present

## 2023-04-15 DIAGNOSIS — E039 Hypothyroidism, unspecified: Secondary | ICD-10-CM | POA: Diagnosis not present

## 2023-04-15 DIAGNOSIS — D649 Anemia, unspecified: Secondary | ICD-10-CM | POA: Diagnosis not present

## 2023-04-15 DIAGNOSIS — L89891 Pressure ulcer of other site, stage 1: Secondary | ICD-10-CM | POA: Diagnosis not present

## 2023-04-15 DIAGNOSIS — E1122 Type 2 diabetes mellitus with diabetic chronic kidney disease: Secondary | ICD-10-CM | POA: Diagnosis not present

## 2023-04-15 DIAGNOSIS — L89152 Pressure ulcer of sacral region, stage 2: Secondary | ICD-10-CM | POA: Diagnosis not present

## 2023-04-15 DIAGNOSIS — E440 Moderate protein-calorie malnutrition: Secondary | ICD-10-CM | POA: Diagnosis not present

## 2023-04-15 DIAGNOSIS — G8220 Paraplegia, unspecified: Secondary | ICD-10-CM | POA: Diagnosis not present

## 2023-04-18 DIAGNOSIS — R54 Age-related physical debility: Secondary | ICD-10-CM | POA: Diagnosis not present

## 2023-04-18 DIAGNOSIS — M3390 Dermatopolymyositis, unspecified, organ involvement unspecified: Secondary | ICD-10-CM | POA: Diagnosis not present

## 2023-04-18 DIAGNOSIS — M810 Age-related osteoporosis without current pathological fracture: Secondary | ICD-10-CM | POA: Diagnosis not present

## 2023-04-19 ENCOUNTER — Ambulatory Visit: Payer: Medicare HMO

## 2023-04-19 DIAGNOSIS — M6281 Muscle weakness (generalized): Secondary | ICD-10-CM | POA: Diagnosis not present

## 2023-04-19 DIAGNOSIS — M810 Age-related osteoporosis without current pathological fracture: Secondary | ICD-10-CM | POA: Diagnosis not present

## 2023-04-19 DIAGNOSIS — L89152 Pressure ulcer of sacral region, stage 2: Secondary | ICD-10-CM | POA: Diagnosis not present

## 2023-04-19 DIAGNOSIS — Z7982 Long term (current) use of aspirin: Secondary | ICD-10-CM | POA: Diagnosis not present

## 2023-04-19 DIAGNOSIS — R278 Other lack of coordination: Secondary | ICD-10-CM | POA: Diagnosis not present

## 2023-04-19 DIAGNOSIS — I700 Atherosclerosis of aorta: Secondary | ICD-10-CM | POA: Diagnosis not present

## 2023-04-19 DIAGNOSIS — G8220 Paraplegia, unspecified: Secondary | ICD-10-CM | POA: Diagnosis not present

## 2023-04-19 DIAGNOSIS — E1122 Type 2 diabetes mellitus with diabetic chronic kidney disease: Secondary | ICD-10-CM | POA: Diagnosis not present

## 2023-04-19 DIAGNOSIS — N182 Chronic kidney disease, stage 2 (mild): Secondary | ICD-10-CM | POA: Diagnosis not present

## 2023-04-19 DIAGNOSIS — Z7902 Long term (current) use of antithrombotics/antiplatelets: Secondary | ICD-10-CM | POA: Diagnosis not present

## 2023-04-19 DIAGNOSIS — Z87440 Personal history of urinary (tract) infections: Secondary | ICD-10-CM | POA: Diagnosis not present

## 2023-04-19 DIAGNOSIS — M150 Primary generalized (osteo)arthritis: Secondary | ICD-10-CM | POA: Diagnosis not present

## 2023-04-19 DIAGNOSIS — Z794 Long term (current) use of insulin: Secondary | ICD-10-CM | POA: Diagnosis not present

## 2023-04-19 DIAGNOSIS — E039 Hypothyroidism, unspecified: Secondary | ICD-10-CM | POA: Diagnosis not present

## 2023-04-19 DIAGNOSIS — E1151 Type 2 diabetes mellitus with diabetic peripheral angiopathy without gangrene: Secondary | ICD-10-CM | POA: Diagnosis not present

## 2023-04-19 DIAGNOSIS — F0280 Dementia in other diseases classified elsewhere without behavioral disturbance: Secondary | ICD-10-CM | POA: Diagnosis not present

## 2023-04-19 DIAGNOSIS — D649 Anemia, unspecified: Secondary | ICD-10-CM | POA: Diagnosis not present

## 2023-04-19 DIAGNOSIS — E11649 Type 2 diabetes mellitus with hypoglycemia without coma: Secondary | ICD-10-CM | POA: Diagnosis not present

## 2023-04-19 DIAGNOSIS — Z993 Dependence on wheelchair: Secondary | ICD-10-CM | POA: Diagnosis not present

## 2023-04-19 DIAGNOSIS — I5032 Chronic diastolic (congestive) heart failure: Secondary | ICD-10-CM | POA: Diagnosis not present

## 2023-04-19 DIAGNOSIS — U071 COVID-19: Secondary | ICD-10-CM | POA: Diagnosis not present

## 2023-04-19 DIAGNOSIS — L89891 Pressure ulcer of other site, stage 1: Secondary | ICD-10-CM | POA: Diagnosis not present

## 2023-04-19 DIAGNOSIS — E440 Moderate protein-calorie malnutrition: Secondary | ICD-10-CM | POA: Diagnosis not present

## 2023-04-20 DIAGNOSIS — I700 Atherosclerosis of aorta: Secondary | ICD-10-CM | POA: Diagnosis not present

## 2023-04-20 DIAGNOSIS — L89891 Pressure ulcer of other site, stage 1: Secondary | ICD-10-CM | POA: Diagnosis not present

## 2023-04-20 DIAGNOSIS — E11649 Type 2 diabetes mellitus with hypoglycemia without coma: Secondary | ICD-10-CM | POA: Diagnosis not present

## 2023-04-20 DIAGNOSIS — U071 COVID-19: Secondary | ICD-10-CM | POA: Diagnosis not present

## 2023-04-20 DIAGNOSIS — E440 Moderate protein-calorie malnutrition: Secondary | ICD-10-CM | POA: Diagnosis not present

## 2023-04-20 DIAGNOSIS — Z7982 Long term (current) use of aspirin: Secondary | ICD-10-CM | POA: Diagnosis not present

## 2023-04-20 DIAGNOSIS — R278 Other lack of coordination: Secondary | ICD-10-CM | POA: Diagnosis not present

## 2023-04-20 DIAGNOSIS — N182 Chronic kidney disease, stage 2 (mild): Secondary | ICD-10-CM | POA: Diagnosis not present

## 2023-04-20 DIAGNOSIS — I5032 Chronic diastolic (congestive) heart failure: Secondary | ICD-10-CM | POA: Diagnosis not present

## 2023-04-20 DIAGNOSIS — E039 Hypothyroidism, unspecified: Secondary | ICD-10-CM | POA: Diagnosis not present

## 2023-04-20 DIAGNOSIS — Z794 Long term (current) use of insulin: Secondary | ICD-10-CM | POA: Diagnosis not present

## 2023-04-20 DIAGNOSIS — G8220 Paraplegia, unspecified: Secondary | ICD-10-CM | POA: Diagnosis not present

## 2023-04-20 DIAGNOSIS — Z87440 Personal history of urinary (tract) infections: Secondary | ICD-10-CM | POA: Diagnosis not present

## 2023-04-20 DIAGNOSIS — M810 Age-related osteoporosis without current pathological fracture: Secondary | ICD-10-CM | POA: Diagnosis not present

## 2023-04-20 DIAGNOSIS — D649 Anemia, unspecified: Secondary | ICD-10-CM | POA: Diagnosis not present

## 2023-04-20 DIAGNOSIS — Z993 Dependence on wheelchair: Secondary | ICD-10-CM | POA: Diagnosis not present

## 2023-04-20 DIAGNOSIS — Z7902 Long term (current) use of antithrombotics/antiplatelets: Secondary | ICD-10-CM | POA: Diagnosis not present

## 2023-04-20 DIAGNOSIS — E1122 Type 2 diabetes mellitus with diabetic chronic kidney disease: Secondary | ICD-10-CM | POA: Diagnosis not present

## 2023-04-20 DIAGNOSIS — M6281 Muscle weakness (generalized): Secondary | ICD-10-CM | POA: Diagnosis not present

## 2023-04-20 DIAGNOSIS — L89152 Pressure ulcer of sacral region, stage 2: Secondary | ICD-10-CM | POA: Diagnosis not present

## 2023-04-20 DIAGNOSIS — E1151 Type 2 diabetes mellitus with diabetic peripheral angiopathy without gangrene: Secondary | ICD-10-CM | POA: Diagnosis not present

## 2023-04-20 DIAGNOSIS — M150 Primary generalized (osteo)arthritis: Secondary | ICD-10-CM | POA: Diagnosis not present

## 2023-04-20 DIAGNOSIS — F0280 Dementia in other diseases classified elsewhere without behavioral disturbance: Secondary | ICD-10-CM | POA: Diagnosis not present

## 2023-04-22 ENCOUNTER — Ambulatory Visit (HOSPITAL_BASED_OUTPATIENT_CLINIC_OR_DEPARTMENT_OTHER): Payer: Medicare HMO | Admitting: Internal Medicine

## 2023-04-22 DIAGNOSIS — Z7982 Long term (current) use of aspirin: Secondary | ICD-10-CM | POA: Diagnosis not present

## 2023-04-22 DIAGNOSIS — N182 Chronic kidney disease, stage 2 (mild): Secondary | ICD-10-CM | POA: Diagnosis not present

## 2023-04-22 DIAGNOSIS — E1151 Type 2 diabetes mellitus with diabetic peripheral angiopathy without gangrene: Secondary | ICD-10-CM | POA: Diagnosis not present

## 2023-04-22 DIAGNOSIS — Z993 Dependence on wheelchair: Secondary | ICD-10-CM | POA: Diagnosis not present

## 2023-04-22 DIAGNOSIS — R21 Rash and other nonspecific skin eruption: Secondary | ICD-10-CM | POA: Diagnosis not present

## 2023-04-22 DIAGNOSIS — E1122 Type 2 diabetes mellitus with diabetic chronic kidney disease: Secondary | ICD-10-CM | POA: Diagnosis not present

## 2023-04-22 DIAGNOSIS — M1991 Primary osteoarthritis, unspecified site: Secondary | ICD-10-CM | POA: Diagnosis not present

## 2023-04-22 DIAGNOSIS — E11649 Type 2 diabetes mellitus with hypoglycemia without coma: Secondary | ICD-10-CM | POA: Diagnosis not present

## 2023-04-22 DIAGNOSIS — M3390 Dermatopolymyositis, unspecified, organ involvement unspecified: Secondary | ICD-10-CM | POA: Diagnosis not present

## 2023-04-22 DIAGNOSIS — M6281 Muscle weakness (generalized): Secondary | ICD-10-CM | POA: Diagnosis not present

## 2023-04-22 DIAGNOSIS — Z79899 Other long term (current) drug therapy: Secondary | ICD-10-CM | POA: Diagnosis not present

## 2023-04-22 DIAGNOSIS — Z7902 Long term (current) use of antithrombotics/antiplatelets: Secondary | ICD-10-CM | POA: Diagnosis not present

## 2023-04-22 DIAGNOSIS — E440 Moderate protein-calorie malnutrition: Secondary | ICD-10-CM | POA: Diagnosis not present

## 2023-04-22 DIAGNOSIS — G8220 Paraplegia, unspecified: Secondary | ICD-10-CM | POA: Diagnosis not present

## 2023-04-22 DIAGNOSIS — Z794 Long term (current) use of insulin: Secondary | ICD-10-CM | POA: Diagnosis not present

## 2023-04-22 DIAGNOSIS — M4802 Spinal stenosis, cervical region: Secondary | ICD-10-CM | POA: Diagnosis not present

## 2023-04-22 DIAGNOSIS — E039 Hypothyroidism, unspecified: Secondary | ICD-10-CM | POA: Diagnosis not present

## 2023-04-22 DIAGNOSIS — L89891 Pressure ulcer of other site, stage 1: Secondary | ICD-10-CM | POA: Diagnosis not present

## 2023-04-22 DIAGNOSIS — I700 Atherosclerosis of aorta: Secondary | ICD-10-CM | POA: Diagnosis not present

## 2023-04-22 DIAGNOSIS — U071 COVID-19: Secondary | ICD-10-CM | POA: Diagnosis not present

## 2023-04-22 DIAGNOSIS — F0280 Dementia in other diseases classified elsewhere without behavioral disturbance: Secondary | ICD-10-CM | POA: Diagnosis not present

## 2023-04-22 DIAGNOSIS — R278 Other lack of coordination: Secondary | ICD-10-CM | POA: Diagnosis not present

## 2023-04-22 DIAGNOSIS — L89152 Pressure ulcer of sacral region, stage 2: Secondary | ICD-10-CM | POA: Diagnosis not present

## 2023-04-22 DIAGNOSIS — M810 Age-related osteoporosis without current pathological fracture: Secondary | ICD-10-CM | POA: Diagnosis not present

## 2023-04-22 DIAGNOSIS — I5032 Chronic diastolic (congestive) heart failure: Secondary | ICD-10-CM | POA: Diagnosis not present

## 2023-04-22 DIAGNOSIS — D649 Anemia, unspecified: Secondary | ICD-10-CM | POA: Diagnosis not present

## 2023-04-22 DIAGNOSIS — M150 Primary generalized (osteo)arthritis: Secondary | ICD-10-CM | POA: Diagnosis not present

## 2023-04-22 DIAGNOSIS — Z87440 Personal history of urinary (tract) infections: Secondary | ICD-10-CM | POA: Diagnosis not present

## 2023-04-27 DIAGNOSIS — Z993 Dependence on wheelchair: Secondary | ICD-10-CM | POA: Diagnosis not present

## 2023-04-27 DIAGNOSIS — G8220 Paraplegia, unspecified: Secondary | ICD-10-CM | POA: Diagnosis not present

## 2023-04-27 DIAGNOSIS — I700 Atherosclerosis of aorta: Secondary | ICD-10-CM | POA: Diagnosis not present

## 2023-04-27 DIAGNOSIS — Z7902 Long term (current) use of antithrombotics/antiplatelets: Secondary | ICD-10-CM | POA: Diagnosis not present

## 2023-04-27 DIAGNOSIS — L89152 Pressure ulcer of sacral region, stage 2: Secondary | ICD-10-CM | POA: Diagnosis not present

## 2023-04-27 DIAGNOSIS — E1151 Type 2 diabetes mellitus with diabetic peripheral angiopathy without gangrene: Secondary | ICD-10-CM | POA: Diagnosis not present

## 2023-04-27 DIAGNOSIS — M810 Age-related osteoporosis without current pathological fracture: Secondary | ICD-10-CM | POA: Diagnosis not present

## 2023-04-27 DIAGNOSIS — F0280 Dementia in other diseases classified elsewhere without behavioral disturbance: Secondary | ICD-10-CM | POA: Diagnosis not present

## 2023-04-27 DIAGNOSIS — R278 Other lack of coordination: Secondary | ICD-10-CM | POA: Diagnosis not present

## 2023-04-27 DIAGNOSIS — L89891 Pressure ulcer of other site, stage 1: Secondary | ICD-10-CM | POA: Diagnosis not present

## 2023-04-27 DIAGNOSIS — M6281 Muscle weakness (generalized): Secondary | ICD-10-CM | POA: Diagnosis not present

## 2023-04-27 DIAGNOSIS — D649 Anemia, unspecified: Secondary | ICD-10-CM | POA: Diagnosis not present

## 2023-04-27 DIAGNOSIS — M150 Primary generalized (osteo)arthritis: Secondary | ICD-10-CM | POA: Diagnosis not present

## 2023-04-27 DIAGNOSIS — I5032 Chronic diastolic (congestive) heart failure: Secondary | ICD-10-CM | POA: Diagnosis not present

## 2023-04-27 DIAGNOSIS — Z794 Long term (current) use of insulin: Secondary | ICD-10-CM | POA: Diagnosis not present

## 2023-04-27 DIAGNOSIS — Z7982 Long term (current) use of aspirin: Secondary | ICD-10-CM | POA: Diagnosis not present

## 2023-04-27 DIAGNOSIS — N182 Chronic kidney disease, stage 2 (mild): Secondary | ICD-10-CM | POA: Diagnosis not present

## 2023-04-27 DIAGNOSIS — E039 Hypothyroidism, unspecified: Secondary | ICD-10-CM | POA: Diagnosis not present

## 2023-04-27 DIAGNOSIS — Z87440 Personal history of urinary (tract) infections: Secondary | ICD-10-CM | POA: Diagnosis not present

## 2023-04-27 DIAGNOSIS — E440 Moderate protein-calorie malnutrition: Secondary | ICD-10-CM | POA: Diagnosis not present

## 2023-04-27 DIAGNOSIS — U071 COVID-19: Secondary | ICD-10-CM | POA: Diagnosis not present

## 2023-04-27 DIAGNOSIS — E1122 Type 2 diabetes mellitus with diabetic chronic kidney disease: Secondary | ICD-10-CM | POA: Diagnosis not present

## 2023-04-27 DIAGNOSIS — E11649 Type 2 diabetes mellitus with hypoglycemia without coma: Secondary | ICD-10-CM | POA: Diagnosis not present

## 2023-04-29 DIAGNOSIS — U071 COVID-19: Secondary | ICD-10-CM | POA: Diagnosis not present

## 2023-04-29 DIAGNOSIS — E1122 Type 2 diabetes mellitus with diabetic chronic kidney disease: Secondary | ICD-10-CM | POA: Diagnosis not present

## 2023-04-29 DIAGNOSIS — M810 Age-related osteoporosis without current pathological fracture: Secondary | ICD-10-CM | POA: Diagnosis not present

## 2023-04-29 DIAGNOSIS — F0280 Dementia in other diseases classified elsewhere without behavioral disturbance: Secondary | ICD-10-CM | POA: Diagnosis not present

## 2023-04-29 DIAGNOSIS — E1151 Type 2 diabetes mellitus with diabetic peripheral angiopathy without gangrene: Secondary | ICD-10-CM | POA: Diagnosis not present

## 2023-04-29 DIAGNOSIS — I5032 Chronic diastolic (congestive) heart failure: Secondary | ICD-10-CM | POA: Diagnosis not present

## 2023-04-29 DIAGNOSIS — E039 Hypothyroidism, unspecified: Secondary | ICD-10-CM | POA: Diagnosis not present

## 2023-04-29 DIAGNOSIS — N182 Chronic kidney disease, stage 2 (mild): Secondary | ICD-10-CM | POA: Diagnosis not present

## 2023-04-29 DIAGNOSIS — E11649 Type 2 diabetes mellitus with hypoglycemia without coma: Secondary | ICD-10-CM | POA: Diagnosis not present

## 2023-04-29 DIAGNOSIS — M6281 Muscle weakness (generalized): Secondary | ICD-10-CM | POA: Diagnosis not present

## 2023-04-29 DIAGNOSIS — M150 Primary generalized (osteo)arthritis: Secondary | ICD-10-CM | POA: Diagnosis not present

## 2023-04-29 DIAGNOSIS — Z993 Dependence on wheelchair: Secondary | ICD-10-CM | POA: Diagnosis not present

## 2023-04-29 DIAGNOSIS — I700 Atherosclerosis of aorta: Secondary | ICD-10-CM | POA: Diagnosis not present

## 2023-04-29 DIAGNOSIS — Z794 Long term (current) use of insulin: Secondary | ICD-10-CM | POA: Diagnosis not present

## 2023-04-29 DIAGNOSIS — L89891 Pressure ulcer of other site, stage 1: Secondary | ICD-10-CM | POA: Diagnosis not present

## 2023-04-29 DIAGNOSIS — L89152 Pressure ulcer of sacral region, stage 2: Secondary | ICD-10-CM | POA: Diagnosis not present

## 2023-04-29 DIAGNOSIS — Z87440 Personal history of urinary (tract) infections: Secondary | ICD-10-CM | POA: Diagnosis not present

## 2023-04-29 DIAGNOSIS — Z7982 Long term (current) use of aspirin: Secondary | ICD-10-CM | POA: Diagnosis not present

## 2023-04-29 DIAGNOSIS — G8220 Paraplegia, unspecified: Secondary | ICD-10-CM | POA: Diagnosis not present

## 2023-04-29 DIAGNOSIS — D649 Anemia, unspecified: Secondary | ICD-10-CM | POA: Diagnosis not present

## 2023-04-29 DIAGNOSIS — E440 Moderate protein-calorie malnutrition: Secondary | ICD-10-CM | POA: Diagnosis not present

## 2023-04-29 DIAGNOSIS — Z7902 Long term (current) use of antithrombotics/antiplatelets: Secondary | ICD-10-CM | POA: Diagnosis not present

## 2023-04-29 DIAGNOSIS — R278 Other lack of coordination: Secondary | ICD-10-CM | POA: Diagnosis not present

## 2023-04-30 ENCOUNTER — Inpatient Hospital Stay (HOSPITAL_COMMUNITY)
Admission: EM | Admit: 2023-04-30 | Discharge: 2023-05-05 | DRG: 689 | Disposition: A | Discharge status: Home Health | Payer: Medicare HMO | Attending: Internal Medicine | Admitting: Internal Medicine

## 2023-04-30 ENCOUNTER — Other Ambulatory Visit: Payer: Self-pay

## 2023-04-30 DIAGNOSIS — R569 Unspecified convulsions: Secondary | ICD-10-CM | POA: Diagnosis present

## 2023-04-30 DIAGNOSIS — E1159 Type 2 diabetes mellitus with other circulatory complications: Secondary | ICD-10-CM | POA: Diagnosis present

## 2023-04-30 DIAGNOSIS — Z79890 Hormone replacement therapy (postmenopausal): Secondary | ICD-10-CM

## 2023-04-30 DIAGNOSIS — E871 Hypo-osmolality and hyponatremia: Secondary | ICD-10-CM | POA: Diagnosis present

## 2023-04-30 DIAGNOSIS — E039 Hypothyroidism, unspecified: Secondary | ICD-10-CM | POA: Diagnosis present

## 2023-04-30 DIAGNOSIS — I10 Essential (primary) hypertension: Secondary | ICD-10-CM | POA: Diagnosis present

## 2023-04-30 DIAGNOSIS — Z794 Long term (current) use of insulin: Secondary | ICD-10-CM

## 2023-04-30 DIAGNOSIS — Z993 Dependence on wheelchair: Secondary | ICD-10-CM

## 2023-04-30 DIAGNOSIS — R627 Adult failure to thrive: Secondary | ICD-10-CM | POA: Diagnosis present

## 2023-04-30 DIAGNOSIS — Z87440 Personal history of urinary (tract) infections: Secondary | ICD-10-CM

## 2023-04-30 DIAGNOSIS — Z888 Allergy status to other drugs, medicaments and biological substances status: Secondary | ICD-10-CM

## 2023-04-30 DIAGNOSIS — I739 Peripheral vascular disease, unspecified: Secondary | ICD-10-CM | POA: Diagnosis present

## 2023-04-30 DIAGNOSIS — G9341 Metabolic encephalopathy: Secondary | ICD-10-CM | POA: Diagnosis not present

## 2023-04-30 DIAGNOSIS — Z681 Body mass index (BMI) 19 or less, adult: Secondary | ICD-10-CM

## 2023-04-30 DIAGNOSIS — Z90710 Acquired absence of both cervix and uterus: Secondary | ICD-10-CM

## 2023-04-30 DIAGNOSIS — N390 Urinary tract infection, site not specified: Secondary | ICD-10-CM | POA: Diagnosis not present

## 2023-04-30 DIAGNOSIS — Z882 Allergy status to sulfonamides status: Secondary | ICD-10-CM

## 2023-04-30 DIAGNOSIS — R55 Syncope and collapse: Secondary | ICD-10-CM | POA: Diagnosis not present

## 2023-04-30 DIAGNOSIS — D539 Nutritional anemia, unspecified: Secondary | ICD-10-CM | POA: Diagnosis present

## 2023-04-30 DIAGNOSIS — I152 Hypertension secondary to endocrine disorders: Secondary | ICD-10-CM | POA: Diagnosis present

## 2023-04-30 DIAGNOSIS — Z881 Allergy status to other antibiotic agents status: Secondary | ICD-10-CM

## 2023-04-30 DIAGNOSIS — E876 Hypokalemia: Secondary | ICD-10-CM | POA: Diagnosis present

## 2023-04-30 DIAGNOSIS — E1122 Type 2 diabetes mellitus with diabetic chronic kidney disease: Secondary | ICD-10-CM | POA: Diagnosis present

## 2023-04-30 DIAGNOSIS — I213 ST elevation (STEMI) myocardial infarction of unspecified site: Secondary | ICD-10-CM | POA: Diagnosis not present

## 2023-04-30 DIAGNOSIS — Z7952 Long term (current) use of systemic steroids: Secondary | ICD-10-CM

## 2023-04-30 DIAGNOSIS — N3000 Acute cystitis without hematuria: Principal | ICD-10-CM

## 2023-04-30 DIAGNOSIS — Z79899 Other long term (current) drug therapy: Secondary | ICD-10-CM

## 2023-04-30 DIAGNOSIS — E1151 Type 2 diabetes mellitus with diabetic peripheral angiopathy without gangrene: Secondary | ICD-10-CM | POA: Diagnosis present

## 2023-04-30 DIAGNOSIS — M3390 Dermatopolymyositis, unspecified, organ involvement unspecified: Secondary | ICD-10-CM | POA: Diagnosis present

## 2023-04-30 DIAGNOSIS — M810 Age-related osteoporosis without current pathological fracture: Secondary | ICD-10-CM | POA: Diagnosis present

## 2023-04-30 DIAGNOSIS — Z66 Do not resuscitate: Secondary | ICD-10-CM | POA: Diagnosis present

## 2023-04-30 DIAGNOSIS — I129 Hypertensive chronic kidney disease with stage 1 through stage 4 chronic kidney disease, or unspecified chronic kidney disease: Secondary | ICD-10-CM | POA: Diagnosis present

## 2023-04-30 DIAGNOSIS — Z7902 Long term (current) use of antithrombotics/antiplatelets: Secondary | ICD-10-CM

## 2023-04-30 DIAGNOSIS — F0390 Unspecified dementia without behavioral disturbance: Secondary | ICD-10-CM | POA: Diagnosis present

## 2023-04-30 DIAGNOSIS — Z833 Family history of diabetes mellitus: Secondary | ICD-10-CM

## 2023-04-30 DIAGNOSIS — I959 Hypotension, unspecified: Secondary | ICD-10-CM | POA: Diagnosis not present

## 2023-04-30 DIAGNOSIS — M3313 Other dermatomyositis without myopathy: Secondary | ICD-10-CM | POA: Diagnosis present

## 2023-04-30 DIAGNOSIS — Z87891 Personal history of nicotine dependence: Secondary | ICD-10-CM

## 2023-04-30 DIAGNOSIS — E7800 Pure hypercholesterolemia, unspecified: Secondary | ICD-10-CM | POA: Diagnosis present

## 2023-04-30 DIAGNOSIS — Z87898 Personal history of other specified conditions: Secondary | ICD-10-CM

## 2023-04-30 DIAGNOSIS — R400 Somnolence: Secondary | ICD-10-CM | POA: Diagnosis not present

## 2023-04-30 DIAGNOSIS — Z886 Allergy status to analgesic agent status: Secondary | ICD-10-CM

## 2023-04-30 DIAGNOSIS — K219 Gastro-esophageal reflux disease without esophagitis: Secondary | ICD-10-CM | POA: Diagnosis present

## 2023-04-30 DIAGNOSIS — B961 Klebsiella pneumoniae [K. pneumoniae] as the cause of diseases classified elsewhere: Secondary | ICD-10-CM | POA: Diagnosis present

## 2023-04-30 DIAGNOSIS — Z8249 Family history of ischemic heart disease and other diseases of the circulatory system: Secondary | ICD-10-CM

## 2023-04-30 DIAGNOSIS — N182 Chronic kidney disease, stage 2 (mild): Secondary | ICD-10-CM | POA: Diagnosis present

## 2023-04-30 DIAGNOSIS — E119 Type 2 diabetes mellitus without complications: Secondary | ICD-10-CM

## 2023-04-30 DIAGNOSIS — Z743 Need for continuous supervision: Secondary | ICD-10-CM | POA: Diagnosis not present

## 2023-04-30 LAB — COMPREHENSIVE METABOLIC PANEL
ALT: 55 U/L — ABNORMAL HIGH (ref 0–44)
AST: 53 U/L — ABNORMAL HIGH (ref 15–41)
Albumin: 3.2 g/dL — ABNORMAL LOW (ref 3.5–5.0)
Alkaline Phosphatase: 99 U/L (ref 38–126)
Anion gap: 8 (ref 5–15)
BUN: 20 mg/dL (ref 8–23)
CO2: 31 mmol/L (ref 22–32)
Calcium: 9.6 mg/dL (ref 8.9–10.3)
Chloride: 94 mmol/L — ABNORMAL LOW (ref 98–111)
Creatinine, Ser: 0.82 mg/dL (ref 0.44–1.00)
GFR, Estimated: >60 mL/min (ref >60)
Glucose, Bld: 99 mg/dL (ref 70–99)
Potassium: 3.6 mmol/L (ref 3.5–5.1)
Sodium: 133 mmol/L — ABNORMAL LOW (ref 135–145)
Total Bilirubin: 0.3 mg/dL (ref 0.3–1.2)
Total Protein: 6.9 g/dL (ref 6.5–8.1)

## 2023-04-30 LAB — URINALYSIS, ROUTINE W REFLEX MICROSCOPIC
Bilirubin Urine: NEGATIVE
Glucose, UA: NEGATIVE mg/dL
Ketones, ur: NEGATIVE mg/dL
Nitrite: POSITIVE — AB
Protein, ur: 100 mg/dL — AB
Specific Gravity, Urine: 1.013 (ref 1.005–1.030)
WBC, UA: >50 WBC/hpf (ref 0–5)
pH: 7 (ref 5.0–8.0)

## 2023-04-30 LAB — CBC WITH DIFFERENTIAL/PLATELET
Abs Immature Granulocytes: 0.07 10*3/uL (ref 0.00–0.07)
Basophils Absolute: 0 10*3/uL (ref 0.0–0.1)
Basophils Relative: 0 %
Eosinophils Absolute: 0.1 10*3/uL (ref 0.0–0.5)
Eosinophils Relative: 1 %
HCT: 29.2 % — ABNORMAL LOW (ref 36.0–46.0)
Hemoglobin: 9.6 g/dL — ABNORMAL LOW (ref 12.0–15.0)
Immature Granulocytes: 1 %
Lymphocytes Relative: 9 %
Lymphs Abs: 1.1 10*3/uL (ref 0.7–4.0)
MCH: 33.3 pg (ref 26.0–34.0)
MCHC: 32.9 g/dL (ref 30.0–36.0)
MCV: 101.4 fL — ABNORMAL HIGH (ref 80.0–100.0)
Monocytes Absolute: 0.8 10*3/uL (ref 0.1–1.0)
Monocytes Relative: 7 %
Neutro Abs: 9.7 10*3/uL — ABNORMAL HIGH (ref 1.7–7.7)
Neutrophils Relative %: 82 %
Platelets: 279 10*3/uL (ref 150–400)
RBC: 2.88 MIL/uL — ABNORMAL LOW (ref 3.87–5.11)
RDW: 14.9 % (ref 11.5–15.5)
WBC: 11.8 10*3/uL — ABNORMAL HIGH (ref 4.0–10.5)
nRBC: 0 % (ref 0.0–0.2)

## 2023-04-30 LAB — CBG MONITORING, ED: Glucose-Capillary: 95 mg/dL (ref 70–99)

## 2023-04-30 LAB — TROPONIN I (HIGH SENSITIVITY): Troponin I (High Sensitivity): 10 ng/L (ref <18)

## 2023-04-30 MED ORDER — SODIUM CHLORIDE 0.9 % IV SOLN
1.0000 g | Freq: Once | INTRAVENOUS | Status: AC
  Administered 2023-04-30: 1 g via INTRAVENOUS
  Filled 2023-04-30: qty 10

## 2023-04-30 NOTE — ED Notes (Signed)
Unable to start IV   

## 2023-04-30 NOTE — H&P (Signed)
History and Physical    [name] [name] ZDG:[id number] DOB: August 18, 1933 DOA: 04/30/2023  PCP: [name], MD  Patient coming from: Home  I have personally briefly reviewed patient's old medical records in [facility]  Chief Complaint: Altered mental status  HPI: [name] [name] is a 89 y.o. female with medical history significant for dementia, insulin-dependent T2DM, dermatomyositis (on Plaquenil and chronic pred 5 mg daily), PAD, history of syncope due to orthostasis and vasovagal episodes, history of seizure, hypothyroidism, macrocytic anemia who presented to the ED for evaluation of altered mental status and near syncopal episode.  Patient is unable to provide history due to dementia with acute encephalopathy and is otherwise obtained by EDP, chart review, and daughter at bedside.  Patient is chronically bed/wheelchair bound due to history of dermatomyositis.  Daughter states she was recently treated for UTI and completed antibiotics this past Monday.  Earlier today patient was having bowel movement.  Family believes she was straining and subsequently had a near syncopal episode.  She then threw up afterwards.  Since then she has been more lethargic and less interactive than her usual baseline.  She was brought to the ED for further evaluation.  ED Course  Labs/Imaging on admission: I have personally reviewed following labs and imaging studies.  Initial vitals showed BP 169/59, pulse 60, RR 17, temp 97.9 F, SpO2 100% on room air.  Labs show WBC 11.8, hemoglobin 9.6, platelets 279,000, sodium 133, potassium 3.6, bicarb 31, BUN 20, creatinine 0.82, serum glucose 99, AST 53, ALT 55, alk phos 99, total bilirubin 0.3, troponin 10.  Urinalysis showed 100 protein, positive nitrites, large leukocytes, 21-50 RBCs, >50 WBCs, many bacteria.  Urine culture ordered and pending  Patient was given IV ceftriaxone.  The hospitalist service was consulted to admit for further evaluation and  management.  Review of Systems:  Unable to obtain full review of systems from patient due to encephalopathy on background of dementia.  Past Medical History:  Diagnosis Date   Arthritis    "knees, legs" (06/10/2016)   Ascites    Chronic kidney disease    "related to my diabetes"   Chronic lower back pain    Dementia without behavioral disturbance (HCC)    Diabetes mellitus without complication (HCC)    Eczema    GERD (gastroesophageal reflux disease)    High cholesterol    Hyperlipidemia    Hypertension    Hypothyroidism    OAB (overactive bladder)    Osteoporosis    Thyroid disease    Type II diabetes mellitus (HCC)    Urticaria     Past Surgical History:  Procedure Laterality Date   ABDOMINAL AORTOGRAM W/LOWER EXTREMITY N/A 02/08/2023   Procedure: ABDOMINAL AORTOGRAM W/LOWER EXTREMITY;  Surgeon: Chuck Hint, MD;  Location: [facility] INVASIVE CV LAB;  Service: Vascular;  Laterality: N/A;   ABDOMINAL HYSTERECTOMY     KNEE ARTHROSCOPY     LAPAROTOMY N/A 12/03/2016   Procedure: EXPLORATORY LAPAROTOMY, LYSIS OF ADHESIONS;  Surgeon: Darnell Level, MD;  Location: [facility] ORS;  Service: General;  Laterality: N/A;   LOOP RECORDER INSERTION N/A 08/23/2019   Procedure: LOOP RECORDER INSERTION;  Surgeon: Marinus Maw, MD;  Location: [facility] INVASIVE CV LAB;  Service: Cardiovascular;  Laterality: N/A;   PERIPHERAL VASCULAR INTERVENTION  02/08/2023   Procedure: PERIPHERAL VASCULAR INTERVENTION;  Surgeon: Chuck Hint, MD;  Location: [facility] INVASIVE CV LAB;  Service: Vascular;;   vocal cord polyps      Social History:  reports that she has never smoked. She quit smokeless tobacco use about 39 years ago.  Her smokeless tobacco use included chew. She reports that she does not drink alcohol and does not use drugs.  Allergies  Allergen Reactions   Nsaids Other (See Comments)    No NSAIDS due to kidney function- ESPECIALLY ibuprofen or Aleve   Biaxin [Clarithromycin] Hives   Statins  Other (See Comments)    Muscle pain (myositis per daugher)   Bactrim [Sulfamethoxazole-Trimethoprim] Hives and Rash   Lisinopril Cough   Sulfa Antibiotics Hives, Rash and Other (See Comments)    NO sulfa-based meds!!     Family History  Problem Relation Age of Onset   Diabetes Mother    Hypertension Mother    Asthma Father    Diabetes Sister    Stomach cancer Sister    Diabetes Brother    Allergic rhinitis Neg Hx    Eczema Neg Hx    Urticaria Neg Hx      Prior to Admission medications   Medication Sig Start Date End Date Taking? Authorizing Provider  acetaminophen (TYLENOL) 650 MG CR tablet Take 650 mg by mouth every 8 (eight) hours as needed for pain.    [provider]  BAYER LOW DOSE 81 MG EC tablet Take 81 mg by mouth in the morning. Swallow whole.    [provider]  cetirizine (ZYRTEC) 10 MG tablet Take 10 mg by mouth 3 (three) times daily as needed for allergies or rhinitis.    [provider]  Cholecalciferol (VITAMIN D-3 PO) Take 2,000 Units by mouth daily.    [provider]  clopidogrel (PLAVIX) 75 MG tablet Take 1 tablet (75 mg total) by mouth daily with breakfast. 02/09/23   Baglia, Corrina, PA-C  collagenase (SANTYL) 250 UNIT/GM ointment Apply 1 Application topically See admin instructions. Applied to right great toe and bunion on the left foot daily. Bandage loosely.    [provider]  ezetimibe (ZETIA) 10 MG tablet Take 1 tablet (10 mg total) by mouth daily. Patient taking differently: Take 10 mg by mouth at bedtime. 02/09/23 02/09/24  Chuck Hint, MD  famotidine (PEPCID) 20 MG tablet Take 20 mg by mouth at bedtime.    [provider]  ferrous sulfate 220 (44 Fe) MG/5ML solution Take 330 mg by mouth 3 (three) times a week. 12/27/22   [provider]  folic acid (FOLVITE) 1 MG tablet Take 1 mg by mouth at bedtime.    [provider]  insulin aspart (NOVOLOG FLEXPEN) 100 UNIT/ML FlexPen  Inject 2-10 Units into the skin See admin instructions. Inject 2-10 units into the skin three times a day with meals, per sliding scale:  Breakfast: BGL 80-199 = 8 units; 200-299 = 9 units; 300 or greater = 10 units Lunch: BGL 80-199 = 5 units; 200-299 = 6 units; 300 or greater = 7 units Supper/evening meal: BGL 80-199 = 2 units; 200-299 = 3 units; 300 or greater = 4 units    [provider]  insulin detemir (LEVEMIR FLEXTOUCH) 100 UNIT/ML FlexPen Inject 3-7 Units into the skin See admin instructions. Inject 7 units into the skin in the morning and 3 units at bedtime    [provider]  levETIRAcetam (KEPPRA) 500 MG tablet Take 500 mg by mouth in the morning and at bedtime. 01/11/23   [provider]  liver oil-zinc oxide (DESITIN) 40 % ointment Apply topically 3 (three) times daily. Patient taking differently: Apply 1  Application topically as needed for irritation (BUTTOCKS). 09/16/21   Rolly Salter, MD  methotrexate (RHEUMATREX) 2.5 MG tablet Take 1 tablet (2.5 mg total) by mouth once a week. Caution:Chemotherapy. Protect from light. Patient taking differently: Take 12.5 mg by mouth every Monday. Caution:Chemotherapy. Protect from light. 01/27/22   Marinda Elk, MD  metoprolol tartrate (LOPRESSOR) 25 MG tablet Take 1 tablet (25 mg total) by mouth 2 (two) times daily. Patient taking differently: Take 12.5 mg by mouth as needed (if Systolic number is over 160 for over 2 hours). 01/20/22   Marinda Elk, MD  midodrine (PROAMATINE) 5 MG tablet Take 0.5 tablets (2.5 mg total) by mouth 3 (three) times daily with meals. 10/05/22   Osvaldo Shipper, MD  Multiple Vitamin (MULTIVITAMIN WITH MINERALS) TABS tablet Take 1 tablet by mouth daily with breakfast.    [provider]  PLAQUENIL 200 MG tablet Take 200 mg by mouth at bedtime. 10/07/22   [provider]  predniSONE (DELTASONE) 5 MG tablet Take 5 mg by mouth daily with breakfast.    [provider]  SYNTHROID 100 MCG tablet Take 100 mcg by mouth daily before breakfast.    [provider]  triamcinolone ointment (KENALOG) 0.1 % Apply 1 Application topically 2 (two) times daily as needed (skin irritation- affected areas). 07/16/22   [provider]    Physical Exam: Vitals:   04/30/23 1958 04/30/23 2215 04/30/23 2345  BP: (!) 169/59 (!) 156/58 (!) 166/73  Pulse: (!) 57 (!) 55 62  Resp: 17 20 12   Temp: 97.9 F (36.6 C)  (!) 97.4 F (36.3 C)  TempSrc: Oral  Axillary  SpO2: 100% 100% 100%   Constitutional: Elderly woman resting in bed in the left lateral decubitus position.  Lethargic, not speaking. Eyes: EOMI, lids and conjunctivae normal ENMT: Mucous membranes are moist. Posterior pharynx clear of any exudate or lesions.Normal dentition.  Neck: normal, supple, no masses. Respiratory: clear to auscultation bilaterally, no wheezing, no crackles. Normal respiratory effort. No accessory muscle use.  Cardiovascular: Regular rate and rhythm, no murmurs / rubs / gallops. No extremity edema. 2+ pedal pulses. Abdomen: no obvious tenderness elicited with palpation, no masses palpated. Musculoskeletal: no clubbing / cyanosis. No joint deformity upper and lower extremities. Good ROM, no contractures. Normal muscle tone.  Skin: No induration Neurologic: Sensation intact. Strength equal bilaterally. Psychiatric: Awake and alert, nonverbal.  EKG: Personally reviewed. Sinus rhythm, rate 58, no acute ischemic changes.  Rate is slower when compared to previous.  Assessment/Plan Principal Problem:   Acute metabolic encephalopathy Active Problems:   UTI (urinary tract infection)   Insulin dependent type 2 diabetes mellitus (HCC)   History of seizure   Hypertension with hx of orthostatic hypotension   Hypothyroidism   Dermatopolymyositis, unspecified, organ involvement unspecified (HCC)   Dementia without behavioral disturbance (HCC)   Peripheral artery  disease (HCC)   Macrocytic anemia   [name] [name] is a 89 y.o. female with medical history significant for dementia, insulin-dependent T2DM, dermatomyositis (on Plaquenil and chronic pred 5 mg daily), PAD, history of syncope due to orthostasis and vasovagal episodes, history of seizure, hypothyroidism, macrocytic anemia who is admitted for evaluation of acute metabolic encephalopathy.  Assessment and Plan: Acute metabolic encephalopathy due to UTI: Patient with acute change in mentation compared to baseline dementia.  Most likely due to UTI, if urinalysis is strongly suggestive of infection.  Family also reported a near syncopal episode during bowel movement, suspect vasovagal from straining.  She did not completely lose consciousness.  He also reported an episode of emesis afterwards. -Continue IV ceftriaxone -Follow urine culture -Obtain chest x-ray  Dementia: Decreased mentation from baseline dementia as above.  Continue antibiotics, delirium precautions.  Insulin-dependent type 2 diabetes: Placed on sensitive SSI.  Dermatomyositis: Continue home prednisone 5 mg daily and Plaquenil.  Chronically bed/wheelchair bound.  Macrocytic anemia: Hemoglobin is stable.  Hypothyroidism: Continue Synthroid.  Peripheral artery disease: S/p angioplasty of b/l common iliac artery stenoses 02/08/2023.  Continue aspirin, Plavix, Zetia.  History of orthostasis: Continue midodrine.  History of seizure: Continue Keppra.   DVT prophylaxis: enoxaparin (LOVENOX) injection 40 mg Start: 05/01/23 2200 Code Status:   Code Status: Do not attempt resuscitation (DNR) PRE-ARREST INTERVENTIONS DESIRED discussed with patient's daughter on admission. Family Communication: Daughter at bedside Disposition Plan: From home and likely return to home with family pending clinical progress Consults called: None Severity of Illness: The appropriate patient status for this patient is OBSERVATION. Observation status  is judged to be reasonable and necessary in order to provide the required intensity of service to ensure the patient's safety. The patient's presenting symptoms, physical exam findings, and initial radiographic and laboratory data in the context of their medical condition is felt to place them at decreased risk for further clinical deterioration. Furthermore, it is anticipated that the patient will be medically stable for discharge from the hospital within 2 midnights of admission.   Darreld Mclean MD [facility]  If 7PM-7AM, please contact night-coverage [email or web address]  05/01/2023, 12:19 AM

## 2023-04-30 NOTE — ED Triage Notes (Signed)
Ems- was sitting on the commode and had a syncope slid out.  Failure to thrive.

## 2023-04-30 NOTE — ED Provider Notes (Signed)
[place] EMERGENCY DEPARTMENT AT [facility] Provider Note   CSN: [id number] Arrival date & time: 04/30/23  1938     History  Chief Complaint  Patient presents with   Syncope     [name] [name] is a 89 y.o. female with PMH as listed below who presents with generalized weakness, syncope.  Presents with her daughter who provides the entirety of the history. Patient had a syncopal episode while in the toilet today.  She slumped down onto the floor.  Did not hit her head or lose consciousness, witnessed by her daughter.  She has been weak and had decreased p.o. intake over the last several days.  Also acting more fatigued, sleepy, less interactive than normal.  Not talking as much as she usually does.  Recently was treated for a UTI and finished medications a few days ago.  No fevers chills, cough, shortness of breath, nausea vomiting, diarrhea noted by her daughter.  Patient has not complained of any pain anywhere.   Past Medical History:  Diagnosis Date   Arthritis    "knees, legs" (06/10/2016)   Ascites    Chronic kidney disease    "related to my diabetes"   Chronic lower back pain    Dementia without behavioral disturbance (HCC)    Diabetes mellitus without complication (HCC)    Eczema    GERD (gastroesophageal reflux disease)    High cholesterol    Hyperlipidemia    Hypertension    Hypothyroidism    OAB (overactive bladder)    Osteoporosis    Thyroid disease    Type II diabetes mellitus (HCC)    Urticaria        Home Medications Prior to Admission medications   Medication Sig Start Date End Date Taking? Authorizing Provider  acetaminophen (TYLENOL) 650 MG CR tablet Take 650 mg by mouth every 8 (eight) hours as needed for pain.    [provider]  BAYER LOW DOSE 81 MG EC tablet Take 81 mg by mouth in the morning. Swallow whole.    [provider]  cetirizine (ZYRTEC) 10 MG tablet Take 10 mg by mouth 3 (three) times daily as needed for  allergies or rhinitis.    [provider]  Cholecalciferol (VITAMIN D-3 PO) Take 2,000 Units by mouth daily.    [provider]  clopidogrel (PLAVIX) 75 MG tablet Take 1 tablet (75 mg total) by mouth daily with breakfast. 02/09/23   Baglia, Corrina, PA-C  collagenase (SANTYL) 250 UNIT/GM ointment Apply 1 Application topically See admin instructions. Applied to right great toe and bunion on the left foot daily. Bandage loosely.    [provider]  ezetimibe (ZETIA) 10 MG tablet Take 1 tablet (10 mg total) by mouth daily. Patient taking differently: Take 10 mg by mouth at bedtime. 02/09/23 02/09/24  Chuck Hint, MD  famotidine (PEPCID) 20 MG tablet Take 20 mg by mouth at bedtime.    [provider]  ferrous sulfate 220 (44 Fe) MG/5ML solution Take 330 mg by mouth 3 (three) times a week. 12/27/22   [provider]  folic acid (FOLVITE) 1 MG tablet Take 1 mg by mouth at bedtime.    [provider]  insulin aspart (NOVOLOG FLEXPEN) 100 UNIT/ML FlexPen Inject 2-10 Units into the skin See admin instructions. Inject 2-10 units into the skin three times a day with meals, per sliding scale:  Breakfast: BGL 80-199 = 8 units; 200-299 = 9 units; 300 or greater =  10 units Lunch: BGL 80-199 = 5 units; 200-299 = 6 units; 300 or greater = 7 units Supper/evening meal: BGL 80-199 = 2 units; 200-299 = 3 units; 300 or greater = 4 units    [provider]  insulin detemir (LEVEMIR FLEXTOUCH) 100 UNIT/ML FlexPen Inject 3-7 Units into the skin See admin instructions. Inject 7 units into the skin in the morning and 3 units at bedtime    [provider]  levETIRAcetam (KEPPRA) 500 MG tablet Take 500 mg by mouth in the morning and at bedtime. 01/11/23   [provider]  liver oil-zinc oxide (DESITIN) 40 % ointment Apply topically 3 (three) times daily. Patient taking differently: Apply 1 Application topically as needed for irritation  (BUTTOCKS). 09/16/21   Rolly Salter, MD  methotrexate (RHEUMATREX) 2.5 MG tablet Take 1 tablet (2.5 mg total) by mouth once a week. Caution:Chemotherapy. Protect from light. Patient taking differently: Take 12.5 mg by mouth every Monday. Caution:Chemotherapy. Protect from light. 01/27/22   Marinda Elk, MD  metoprolol tartrate (LOPRESSOR) 25 MG tablet Take 1 tablet (25 mg total) by mouth 2 (two) times daily. Patient taking differently: Take 12.5 mg by mouth as needed (if Systolic number is over 160 for over 2 hours). 01/20/22   Marinda Elk, MD  midodrine (PROAMATINE) 5 MG tablet Take 0.5 tablets (2.5 mg total) by mouth 3 (three) times daily with meals. 10/05/22   Osvaldo Shipper, MD  Multiple Vitamin (MULTIVITAMIN WITH MINERALS) TABS tablet Take 1 tablet by mouth daily with breakfast.    [provider]  PLAQUENIL 200 MG tablet Take 200 mg by mouth at bedtime. 10/07/22   [provider]  predniSONE (DELTASONE) 5 MG tablet Take 5 mg by mouth daily with breakfast.    [provider]  SYNTHROID 100 MCG tablet Take 100 mcg by mouth daily before breakfast.    [provider]  triamcinolone ointment (KENALOG) 0.1 % Apply 1 Application topically 2 (two) times daily as needed (skin irritation- affected areas). 07/16/22   [provider]      Allergies    Nsaids, Biaxin [clarithromycin], Statins, Bactrim [sulfamethoxazole-trimethoprim], Lisinopril, and Sulfa antibiotics    Review of Systems   Review of Systems A 10 point review of systems was performed and is negative unless otherwise reported in HPI.  Physical Exam Updated Vital Signs BP (!) 169/59   Pulse (!) 57   Temp 97.9 F (36.6 C) (Oral)   Resp 17   SpO2 100%  Physical Exam General: Normal appearing female, lying in bed.  HEENT: PERRLA, Sclera anicteric, MMM, trachea midline.  Cardiology: RRR, no murmurs/rubs/gallops. BL radial and DP pulses equal bilaterally.  Resp: Normal  respiratory rate and effort. CTAB, no wheezes, rhonchi, crackles.  Abd: Soft, non-tender, non-distended. No rebound tenderness or guarding.  GU: Deferred. MSK: No peripheral edema or signs of trauma. Extremities without deformity or TTP. No cyanosis or clubbing. Skin: warm, dry. No rashes or lesions. Back: No CVA tenderness Neuro: A&Ox4, CNs II-XII grossly intact. MAEs. Sensation grossly intact.  Psych: Normal mood and affect.   ED Results / Procedures / Treatments   Labs (all labs ordered are listed, but only abnormal results are displayed) Labs Reviewed  CBC WITH DIFFERENTIAL/PLATELET - Abnormal; Notable for the following components:   WBC 11.8 (*)    RBC 2.88 (*)    Hemoglobin 9.6 (*)    HCT 29.2 (*)    MCV 101.4 (*)    Neutro Abs 9.7 (*)  All other components within normal limits  COMPREHENSIVE METABOLIC PANEL - Abnormal; Notable for the following components:   Sodium 133 (*)    Chloride 94 (*)    Albumin 3.2 (*)    AST 53 (*)    ALT 55 (*)    All other components within normal limits  URINALYSIS, ROUTINE W REFLEX MICROSCOPIC - Abnormal; Notable for the following components:   Color, Urine AMBER (*)    APPearance TURBID (*)    Hgb urine dipstick SMALL (*)    Protein, ur 100 (*)    Nitrite POSITIVE (*)    Leukocytes,Ua LARGE (*)    Bacteria, UA MANY (*)    All other components within normal limits  GLUCOSE, CAPILLARY  GLUCOSE, CAPILLARY  GLUCOSE, CAPILLARY  CBG MONITORING, ED  TROPONIN I (HIGH SENSITIVITY)  TROPONIN I (HIGH SENSITIVITY)    EKG EKG Interpretation Date/Time:  Friday April 30 2023 19:57:15 EDT Ventricular Rate:  58 PR Interval:  64 QRS Duration:  99 QT Interval:  449 QTC Calculation: 441 R Axis:   60  Text Interpretation: Sinus rhythm RSR' in V1 or V2, probably normal variant Nonspecific T abnormalities, lateral leads Confirmed by Vivi Barrack [id number]) on 04/30/2023 9:11:36 PM  Radiology No results found.  Procedures Procedures     Medications Ordered in ED Medications  cefTRIAXone (ROCEPHIN) 1 g in sodium chloride 0.9 % 100 mL IVPB (0 g Intravenous Stopped 05/01/23 0727)  potassium chloride SA (KLOR-CON M) CR tablet 40 mEq (40 mEq Oral Given 05/01/23 0911)    ED Course/ Medical Decision Making/ A&P                          Medical Decision Making Amount and/or Complexity of Data Reviewed Labs: ordered. Decision-making details documented in ED Course. ECG/medicine tests: ordered.  Risk Decision regarding hospitalization.    This patient presents to the ED for concern of syncope/fatigue/somnolence, this involves an extensive number of treatment options, and is a complaint that carries with it a high risk of complications and morbidity.  I considered the following differential and admission for this acute, potentially life threatening condition.   MDM:    DDX for syncope includes but is not limited to:  Consider anemia and electrolyte abnormalities as possible etiologies, but Na 133, K 3.6, Hgb 9.6 which is increased from BL 8-9. Consider arrhythmias and ACS, although without associated symptoms, however patient is poor historian and cannot answer questions. EKG is NSR without signs of ischemia, and troponin is wnl at 10. Consider hemorrhage vs CVA, though patient did not have any head trauma and has no FNDs, she is NCAT on exam. Considered PE as well, although less likely without any tachycardia, hypoxia or tachypnea, and no signs of DVT on exam. She was recently treated for UTI which could be cause of AMS/syncope/collapse today, and will get UA. She is afebrile. She does have mild leukocytosis to 11.8 but does not meet criteria for sepsis at this time. Will get urine culture as well. CXR is clear of any PNA, pulm edema, or pleural effusion. She has no hyper/hypoglycemia. She does have a mild transaminitis but no apparent RUQ TTP, has had this also mildly in the past. Per chart review has had similar presentations  with generalized weakness and recurrent syncope in the past requiring admission to hospital, thought to be due to autonomic instability and/or vasovagal syncope. Given that she syncopized/collapsed while having a bowel movement, vasovagal syncope  is also high on the differential for today. She hasn't had any recent changes to her medicines besides the antibiotic for UTI.   Clinical Course as of 05/05/23 0930  Fri Apr 30, 2023  2310 Urinalysis, Routine w reflex microscopic -Urine, Clean Catch(!) +UTI. Will give IV ceftriaxone and consult to hospitalist.  [HN]    Clinical Course User Index [HN] Loetta Rough, MD    Labs: I Ordered, and personally interpreted labs.  The pertinent results include:  t  Imaging Studies ordered: I ordered imaging studies including CXR I independently visualized and interpreted imaging. I agree with the radiologist interpretation  Additional history obtained from daughter at bedside, chart review.   Cardiac Monitoring: The patient was maintained on a cardiac monitor.  I personally viewed and interpreted the cardiac monitored which showed an underlying rhythm of: NSR  Reevaluation: After the interventions noted above, I reevaluated the patient and found that they have :stayed the same  Social Determinants of Health: Lives at home with care  Disposition:  Admitted to hospitalist for altered UTI + sycnope.  Co morbidities that complicate the patient evaluation  Past Medical History:  Diagnosis Date   Arthritis    "knees, legs" (06/10/2016)   Ascites    Chronic kidney disease    "related to my diabetes"   Chronic lower back pain    Dementia without behavioral disturbance (HCC)    Diabetes mellitus without complication (HCC)    Eczema    GERD (gastroesophageal reflux disease)    High cholesterol    Hyperlipidemia    Hypertension    Hypothyroidism    OAB (overactive bladder)    Osteoporosis    Thyroid disease    Type II diabetes mellitus  (HCC)    Urticaria      Medicines No orders of the defined types were placed in this encounter.   I have reviewed the patients home medicines and have made adjustments as needed  Problem List / ED Course: Problem List Items Addressed This Visit       Genitourinary   UTI (urinary tract infection) - Primary   Relevant Medications   fluconazole (DIFLUCAN) 100 MG tablet     Other   Syncope   AMS (altered mental status)                This note was created using dictation software, which may contain spelling or grammatical errors.    Loetta Rough, MD 05/07/23 1630

## 2023-05-01 ENCOUNTER — Observation Stay (HOSPITAL_COMMUNITY): Payer: Medicare HMO

## 2023-05-01 ENCOUNTER — Encounter (HOSPITAL_COMMUNITY): Payer: Self-pay | Admitting: Internal Medicine

## 2023-05-01 DIAGNOSIS — G9340 Encephalopathy, unspecified: Secondary | ICD-10-CM | POA: Diagnosis not present

## 2023-05-01 DIAGNOSIS — I700 Atherosclerosis of aorta: Secondary | ICD-10-CM | POA: Diagnosis not present

## 2023-05-01 DIAGNOSIS — R400 Somnolence: Secondary | ICD-10-CM | POA: Diagnosis not present

## 2023-05-01 DIAGNOSIS — N3000 Acute cystitis without hematuria: Secondary | ICD-10-CM | POA: Diagnosis not present

## 2023-05-01 DIAGNOSIS — R627 Adult failure to thrive: Secondary | ICD-10-CM | POA: Diagnosis not present

## 2023-05-01 DIAGNOSIS — G9341 Metabolic encephalopathy: Secondary | ICD-10-CM | POA: Diagnosis not present

## 2023-05-01 DIAGNOSIS — R55 Syncope and collapse: Secondary | ICD-10-CM | POA: Diagnosis not present

## 2023-05-01 LAB — BASIC METABOLIC PANEL
Anion gap: 11 (ref 5–15)
BUN: 17 mg/dL (ref 8–23)
CO2: 29 mmol/L (ref 22–32)
Calcium: 9.5 mg/dL (ref 8.9–10.3)
Chloride: 94 mmol/L — ABNORMAL LOW (ref 98–111)
Creatinine, Ser: 0.71 mg/dL (ref 0.44–1.00)
GFR, Estimated: >60 mL/min (ref >60)
Glucose, Bld: 91 mg/dL (ref 70–99)
Potassium: 3.3 mmol/L — ABNORMAL LOW (ref 3.5–5.1)
Sodium: 134 mmol/L — ABNORMAL LOW (ref 135–145)

## 2023-05-01 LAB — CBC
HCT: 27.4 % — ABNORMAL LOW (ref 36.0–46.0)
Hemoglobin: 9.1 g/dL — ABNORMAL LOW (ref 12.0–15.0)
MCH: 33.3 pg (ref 26.0–34.0)
MCHC: 33.2 g/dL (ref 30.0–36.0)
MCV: 100.4 fL — ABNORMAL HIGH (ref 80.0–100.0)
Platelets: 280 10*3/uL (ref 150–400)
RBC: 2.73 MIL/uL — ABNORMAL LOW (ref 3.87–5.11)
RDW: 14.9 % (ref 11.5–15.5)
WBC: 8.8 10*3/uL (ref 4.0–10.5)
nRBC: 0 % (ref 0.0–0.2)

## 2023-05-01 LAB — GLUCOSE, CAPILLARY
Glucose-Capillary: 215 mg/dL — ABNORMAL HIGH (ref 70–99)
Glucose-Capillary: 260 mg/dL — ABNORMAL HIGH (ref 70–99)

## 2023-05-01 LAB — CBG MONITORING, ED
Glucose-Capillary: 158 mg/dL — ABNORMAL HIGH (ref 70–99)
Glucose-Capillary: 198 mg/dL — ABNORMAL HIGH (ref 70–99)
Glucose-Capillary: 211 mg/dL — ABNORMAL HIGH (ref 70–99)

## 2023-05-01 LAB — TROPONIN I (HIGH SENSITIVITY): Troponin I (High Sensitivity): 10 ng/L (ref <18)

## 2023-05-01 MED ORDER — POTASSIUM CHLORIDE CRYS ER 20 MEQ PO TBCR
40.0000 meq | EXTENDED_RELEASE_TABLET | Freq: Once | ORAL | Status: AC
  Administered 2023-05-01: 40 meq via ORAL
  Filled 2023-05-01: qty 2

## 2023-05-01 MED ORDER — ACETAMINOPHEN 325 MG PO TABS: 650.0000 mg | ORAL_TABLET | Freq: Four times a day (QID) | ORAL | Status: DC | PRN

## 2023-05-01 MED ORDER — ENOXAPARIN SODIUM 40 MG/0.4ML IJ SOSY
40.0000 mg | PREFILLED_SYRINGE | INTRAMUSCULAR | Status: DC
  Administered 2023-05-01 – 2023-05-04 (×4): 40 mg via SUBCUTANEOUS
  Filled 2023-05-01 (×4): qty 0.4

## 2023-05-01 MED ORDER — EZETIMIBE 10 MG PO TABS
10.0000 mg | ORAL_TABLET | Freq: Every day | ORAL | Status: DC
  Administered 2023-05-01 – 2023-05-04 (×4): 10 mg via ORAL
  Filled 2023-05-01 (×4): qty 1

## 2023-05-01 MED ORDER — SODIUM CHLORIDE 0.9% FLUSH
3.0000 mL | Freq: Two times a day (BID) | INTRAVENOUS | Status: DC
  Administered 2023-05-01 – 2023-05-05 (×8): 3 mL via INTRAVENOUS

## 2023-05-01 MED ORDER — FAMOTIDINE 20 MG PO TABS
20.0000 mg | ORAL_TABLET | Freq: Every day | ORAL | Status: DC
  Administered 2023-05-01 – 2023-05-04 (×5): 20 mg via ORAL
  Filled 2023-05-01 (×5): qty 1

## 2023-05-01 MED ORDER — SENNOSIDES-DOCUSATE SODIUM 8.6-50 MG PO TABS: 1.0000 | ORAL_TABLET | Freq: Every evening | ORAL | Status: DC | PRN

## 2023-05-01 MED ORDER — LEVETIRACETAM 500 MG PO TABS
500.0000 mg | ORAL_TABLET | Freq: Two times a day (BID) | ORAL | Status: DC
  Administered 2023-05-01 (×2): 500 mg via ORAL
  Filled 2023-05-01 (×3): qty 1

## 2023-05-01 MED ORDER — LEVETIRACETAM 500 MG PO TABS
500.0000 mg | ORAL_TABLET | Freq: Two times a day (BID) | ORAL | Status: DC
  Administered 2023-05-01 – 2023-05-05 (×8): 500 mg via ORAL
  Filled 2023-05-01 (×8): qty 1

## 2023-05-01 MED ORDER — PREDNISONE 5 MG PO TABS
5.0000 mg | ORAL_TABLET | Freq: Every day | ORAL | Status: DC
  Administered 2023-05-01 – 2023-05-05 (×5): 5 mg via ORAL
  Filled 2023-05-01 (×5): qty 1

## 2023-05-01 MED ORDER — ACETAMINOPHEN 650 MG RE SUPP: 650.0000 mg | Freq: Four times a day (QID) | RECTAL | Status: DC | PRN

## 2023-05-01 MED ORDER — MIDODRINE HCL 5 MG PO TABS
2.5000 mg | ORAL_TABLET | Freq: Three times a day (TID) | ORAL | Status: DC
  Administered 2023-05-01 – 2023-05-05 (×14): 2.5 mg via ORAL
  Filled 2023-05-01 (×14): qty 1

## 2023-05-01 MED ORDER — HYDROXYCHLOROQUINE SULFATE 200 MG PO TABS
200.0000 mg | ORAL_TABLET | Freq: Every day | ORAL | Status: DC
  Administered 2023-05-01 – 2023-05-04 (×5): 200 mg via ORAL
  Filled 2023-05-01 (×6): qty 1

## 2023-05-01 MED ORDER — CLOPIDOGREL BISULFATE 75 MG PO TABS
75.0000 mg | ORAL_TABLET | Freq: Every day | ORAL | Status: DC
  Administered 2023-05-01 – 2023-05-05 (×5): 75 mg via ORAL
  Filled 2023-05-01 (×5): qty 1

## 2023-05-01 MED ORDER — LEVOTHYROXINE SODIUM 100 MCG PO TABS
100.0000 ug | ORAL_TABLET | Freq: Every day | ORAL | Status: DC
  Administered 2023-05-01 – 2023-05-05 (×5): 100 ug via ORAL
  Filled 2023-05-01 (×5): qty 1

## 2023-05-01 MED ORDER — SODIUM CHLORIDE 0.9 % IV SOLN
1.0000 g | INTRAVENOUS | Status: DC
  Administered 2023-05-01 – 2023-05-04 (×4): 1 g via INTRAVENOUS
  Filled 2023-05-01 (×4): qty 10

## 2023-05-01 MED ORDER — ONDANSETRON HCL 4 MG PO TABS: 4.0000 mg | ORAL_TABLET | Freq: Four times a day (QID) | ORAL | Status: DC | PRN

## 2023-05-01 MED ORDER — ASPIRIN 81 MG PO TBEC
81.0000 mg | DELAYED_RELEASE_TABLET | Freq: Every day | ORAL | Status: DC
  Administered 2023-05-01 – 2023-05-05 (×5): 81 mg via ORAL
  Filled 2023-05-01 (×5): qty 1

## 2023-05-01 MED ORDER — LEVETIRACETAM 100 MG/ML PO SOLN
500.0000 mg | Freq: Two times a day (BID) | ORAL | Status: DC
  Filled 2023-05-01: qty 5

## 2023-05-01 MED ORDER — FOLIC ACID 1 MG PO TABS
1.0000 mg | ORAL_TABLET | Freq: Every day | ORAL | Status: DC
  Administered 2023-05-01 – 2023-05-04 (×5): 1 mg via ORAL
  Filled 2023-05-01 (×5): qty 1

## 2023-05-01 MED ORDER — ONDANSETRON HCL 4 MG/2ML IJ SOLN: 4.0000 mg | Freq: Four times a day (QID) | INTRAMUSCULAR | Status: DC | PRN

## 2023-05-01 MED ORDER — INSULIN ASPART 100 UNIT/ML IJ SOLN
0.0000 [IU] | Freq: Three times a day (TID) | INTRAMUSCULAR | Status: DC
  Administered 2023-05-01 (×2): 3 [IU] via SUBCUTANEOUS
  Administered 2023-05-01: 2 [IU] via SUBCUTANEOUS
  Filled 2023-05-01: qty 0.09

## 2023-05-01 NOTE — Progress Notes (Signed)
[facility]                                                                              [name] [name], is a 89 y.o. female, DOB - June 11, 1934, DGU:[id number] Admit date - 04/30/2023    Outpatient Primary MD for the patient is [name], MD  LOS - 0  days  Chief Complaint  Patient presents with   Syncope        Brief summary   Patient is a 89 year old female with dementia, IDDM, dermatomyositis on Plaquenil and chronic prednisone 5 mg daily, PAD, prior history of syncope due to orthostasis and vasovagal episodes, seizure, hypothyroidism, macrocytic anemia presented with altered mental status, near syncopal episode. At baseline, chronically bed/wheelchair bound due to history of dermatomyositis.  Per daughter, was recently treated for UTI with ?nitrofurantoin and completed antibiotics this past Monday on 04/26/2023.  On the day of admission, patient was having a BM and per family was straining and subsequently had a near syncopal episode with a vomiting episode afterward.  Since then she was noted to be more lethargic and less interactive.  In ED BP 169/59, pulse 60, RR 17, temp 97.9, O2 sats 100% on room air Creatinine 0.8, WBCs 11.8, hemoglobin 9.6 UA positive for UTI   Assessment & Plan    Principal Problem:   Acute metabolic encephalopathy in the setting of UTI and dementia -Patient has baseline dementia, presented with acute change in mentation most likely due to UTI -Check x-ray showed no active disease, -Follow urine culture and sensitivities, continue IV Rocephin -Delirium precautions   Active Problems:   UTI (urinary tract infection) -Follow urine culture and sensitivities, continue IV Rocephin -Requested daughter to possibly obtain urine culture results from MyChart from [place] office to assess the sensitivities.  Unable to review [facility] labs  Near syncope -Likely due to UTI, patient has a prior history of orthostasis and vasovagal episodes.   She did not lose consciousness.  Had episode of emesis afterwards.  Likely vagal episode. -Continue IV antibiotics, will check orthostatic vitals -Continue midodrine    Insulin dependent type 2 diabetes mellitus (HCC) -Continue sliding scale insulin CBG (last 3)  Recent Labs    04/30/23 2221 05/01/23 0755  GLUCAP 95 158*    Hypokalemia -Replaced     History of seizure -Continue Keppra    Hypertension with hx of orthostatic hypotension -Continue midodrine 2.5 mg 3 times daily    Hypothyroidism -Continue Synthroid 100 mcg daily, check TSH    Dermatopolymyositis, unspecified, organ involvement unspecified (HCC) -Continue Plaquenil, prednisone 5 mg daily     Peripheral artery disease (HCC) -Status post angioplasty of bilateral common iliac artery stenosis in 01/2023 -Continue aspirin, Plavix, Zetia    Macrocytic anemia -Follow B12, folate -H&H at baseline    Estimated body mass index is 21.46 kg/m as calculated from the following:   Height as of 03/25/23: 5\' 4"  (1.626 m).   Weight as of 03/25/23: 56.7 kg.  Code Status: DNR DVT Prophylaxis:  enoxaparin (LOVENOX) injection 40 mg Start: 05/01/23 2200   Level of Care: Level of care: Telemetry Family Communication: Updated patient's  daughter at the bedside Disposition Plan:      Remains inpatient appropriate:      Procedures:  None  Consultants:   None  Antimicrobials:   Anti-infectives (From admission, onward)    Start     Dose/Rate Route Frequency Ordered Stop   05/01/23 2200  cefTRIAXone (ROCEPHIN) 1 g in sodium chloride 0.9 % 100 mL IVPB        1 g 200 mL/hr over 30 Minutes Intravenous Every 24 hours 05/01/23 0009     05/01/23 0015  hydroxychloroquine (PLAQUENIL) tablet 200 mg        200 mg Oral Daily at bedtime 05/01/23 0010     04/30/23 2315  cefTRIAXone (ROCEPHIN) 1 g in sodium chloride 0.9 % 100 mL IVPB        1 g 200 mL/hr over 30 Minutes Intravenous  Once 04/30/23 2311 05/01/23 0727           Medications  aspirin EC  81 mg Oral Daily   clopidogrel  75 mg Oral Q breakfast   enoxaparin (LOVENOX) injection  40 mg Subcutaneous Q24H   ezetimibe  10 mg Oral QHS   famotidine  20 mg Oral QHS   folic acid  1 mg Oral QHS   hydroxychloroquine  200 mg Oral QHS   insulin aspart  0-9 Units Subcutaneous TID WC   levETIRAcetam  500 mg Oral BID   levothyroxine  100 mcg Oral Q0600   midodrine  2.5 mg Oral TID WC   potassium chloride  40 mEq Oral Once   predniSONE  5 mg Oral Q breakfast   sodium chloride flush  3 mL Intravenous Q12H      Subjective:   [name] [name] was seen and examined today.  Somewhat more alert and awake, smiled and verbally responded.  Difficult to have conversation or obtain ROS from the patient due to dementia.  Daughter at the bedside.  No fevers.  Objective:   Vitals:   05/01/23 0415 05/01/23 0542 05/01/23 0715 05/01/23 0759  BP: (!) 142/63 (!) 117/59 (!) 123/51   Pulse: 75 62 65   Resp: 11 11 18    Temp: 98.5 F (36.9 C)   (!) 97.5 F (36.4 C)  TempSrc:    Axillary  SpO2: 99% 100% 100%     Intake/Output Summary (Last 24 hours) at 05/01/2023 0912 Last data filed at 05/01/2023 4098 Gross per 24 hour  Intake 100 ml  Output --  Net 100 ml     Wt Readings from Last 3 Encounters:  03/25/23 56.7 kg  02/15/23 57.2 kg  02/08/23 56.7 kg     Exam General: more alert and awake, NAD, appears comfortable Cardiovascular: S1 S2 auscultated,  RRR Respiratory: Clear to auscultation bilaterally, no wheezing Gastrointestinal: Soft, nontender, nondistended, + bowel sounds Ext: no pedal edema bilaterally Neuro: unable to assess, does not follow commands Psych: Dementia     Data Reviewed:  I have personally reviewed following labs    CBC Lab Results  Component Value Date   WBC 8.8 05/01/2023   RBC 2.73 (L) 05/01/2023   HGB 9.1 (L) 05/01/2023   HCT 27.4 (L) 05/01/2023   MCV 100.4 (H) 05/01/2023   MCH 33.3 05/01/2023   PLT 280  05/01/2023   MCHC 33.2 05/01/2023   RDW 14.9 05/01/2023   LYMPHSABS 1.1 04/30/2023   MONOABS 0.8 04/30/2023   EOSABS 0.1 04/30/2023   BASOSABS 0.0 04/30/2023     Last metabolic panel Lab Results  Component  Value Date   NA 134 (L) 05/01/2023   K 3.3 (L) 05/01/2023   CL 94 (L) 05/01/2023   CO2 29 05/01/2023   BUN 17 05/01/2023   CREATININE 0.71 05/01/2023   GLUCOSE 91 05/01/2023   GFRNONAA >60 05/01/2023   GFRAA 54 (L) 10/12/2019   CALCIUM 9.5 05/01/2023   PHOS 2.9 02/15/2023   PROT 6.9 04/30/2023   ALBUMIN 3.2 (L) 04/30/2023   BILITOT 0.3 04/30/2023   ALKPHOS 99 04/30/2023   AST 53 (H) 04/30/2023   ALT 55 (H) 04/30/2023   ANIONGAP 11 05/01/2023    CBG (last 3)  Recent Labs    04/30/23 2221 05/01/23 0755  GLUCAP 95 158*      Coagulation Profile: No results for input(s): "INR", "PROTIME" in the last 168 hours.   Radiology Studies: I have personally reviewed the imaging studies  DG CHEST PORT 1 VIEW  Result Date: 05/01/2023 CLINICAL DATA:  Acute encephalopathy, failure to thrive. EXAM: PORTABLE CHEST 1 VIEW COMPARISON:  03/16/2023. FINDINGS: The heart size and mediastinal contours are within normal limits. There is atherosclerotic calcification of the aorta. No consolidation, effusion, or pneumothorax. A loop recorder device is present over the left chest. No acute osseous abnormality. IMPRESSION: No active disease. Electronically Signed   By: Thornell Sartorius M.D.   On: 05/01/2023 02:41       Rosell Khouri M.D. [facility] 05/01/2023, 9:12 AM  Available via Epic secure chat 7am-7pm After 7 pm, please refer to night coverage provider listed on amion.

## 2023-05-01 NOTE — ED Notes (Signed)
As per daughter request to give meds (crush )with apple sauce. For updates to contact daughter carmela.

## 2023-05-01 NOTE — H&P (Incomplete)
History and Physical    [name] [name] VWU:[id number] DOB: 01/27/1934 DOA: 04/30/2023  PCP: [name], MD  Patient coming from: Home  I have personally briefly reviewed patient's old medical records in [facility]  Chief Complaint: ***  HPI: [name] [name] is a 89 y.o. female with medical history significant for dementia, insulin-dependent T2DM, dermatomyositis (on Plaquenil and chronic pred 5 mg daily), PAD, orthostasis, history of seizure, hypothyroidism, macrocytic anemia who presented to the ED for evaluation of***  ED Course  Labs/Imaging on admission: I have personally reviewed following labs and imaging studies.  Initial vitals showed BP 169/59, pulse 60, RR 17, temp 97.9 F, SpO2 100% on room air.  Labs show WBC 11.8, hemoglobin 9.6, platelets 279,000, sodium 133, potassium 3.6, bicarb 31, BUN 20, creatinine 0.82, serum glucose 99, AST 53, ALT 55, alk phos 99, total bilirubin 0.3, troponin 10.  Urinalysis showed 100 protein, positive nitrites, large leukocytes, 21-50 RBCs, >50 WBCs, many bacteria.  Urine culture ordered and pending  Patient was given IV ceftriaxone.  The hospitalist service was consulted to admit for further evaluation and management.  Review of Systems:  ***All systems reviewed and are negative except as documented in history of present illness above.   Past Medical History:  Diagnosis Date  . Arthritis    "knees, legs" (06/10/2016)  . Ascites   . Chronic kidney disease    "related to my diabetes"  . Chronic lower back pain   . Dementia without behavioral disturbance (HCC)   . Diabetes mellitus without complication (HCC)   . Eczema   . GERD (gastroesophageal reflux disease)   . High cholesterol   . Hyperlipidemia   . Hypertension   . Hypothyroidism   . OAB (overactive bladder)   . Osteoporosis   . Thyroid disease   . Type II diabetes mellitus (HCC)   . Urticaria     Past Surgical History:  Procedure Laterality Date  .  ABDOMINAL AORTOGRAM W/LOWER EXTREMITY N/A 02/08/2023   Procedure: ABDOMINAL AORTOGRAM W/LOWER EXTREMITY;  Surgeon: Chuck Hint, MD;  Location: [facility] INVASIVE CV LAB;  Service: Vascular;  Laterality: N/A;  . ABDOMINAL HYSTERECTOMY    . KNEE ARTHROSCOPY    . LAPAROTOMY N/A 12/03/2016   Procedure: EXPLORATORY LAPAROTOMY, LYSIS OF ADHESIONS;  Surgeon: Darnell Level, MD;  Location: [facility] ORS;  Service: General;  Laterality: N/A;  . LOOP RECORDER INSERTION N/A 08/23/2019   Procedure: LOOP RECORDER INSERTION;  Surgeon: Marinus Maw, MD;  Location: [facility] INVASIVE CV LAB;  Service: Cardiovascular;  Laterality: N/A;  . PERIPHERAL VASCULAR INTERVENTION  02/08/2023   Procedure: PERIPHERAL VASCULAR INTERVENTION;  Surgeon: Chuck Hint, MD;  Location: [facility] INVASIVE CV LAB;  Service: Vascular;;  . vocal cord polyps      Social History:  reports that she has never smoked. She quit smokeless tobacco use about 39 years ago.  Her smokeless tobacco use included chew. She reports that she does not drink alcohol and does not use drugs.  Allergies  Allergen Reactions  . Nsaids Other (See Comments)    No NSAIDS due to kidney function- ESPECIALLY ibuprofen or Aleve  . Biaxin [Clarithromycin] Hives  . Statins Other (See Comments)    Muscle pain (myositis per daugher)  . Bactrim [Sulfamethoxazole-Trimethoprim] Hives and Rash  . Lisinopril Cough  . Sulfa Antibiotics Hives, Rash and Other (See Comments)    NO sulfa-based meds!!     Family History  Problem Relation Age of Onset  .  Diabetes Mother   . Hypertension Mother   . Asthma Father   . Diabetes Sister   . Stomach cancer Sister   . Diabetes Brother   . Allergic rhinitis Neg Hx   . Eczema Neg Hx   . Urticaria Neg Hx      Prior to Admission medications   Medication Sig Start Date End Date Taking? Authorizing Provider  acetaminophen (TYLENOL) 650 MG CR tablet Take 650 mg by mouth every 8 (eight) hours as needed for pain.    [provider]  BAYER LOW DOSE 81 MG EC tablet Take 81 mg by mouth in the morning. Swallow whole.    [provider]  cetirizine (ZYRTEC) 10 MG tablet Take 10 mg by mouth 3 (three) times daily as needed for allergies or rhinitis.    [provider]  Cholecalciferol (VITAMIN D-3 PO) Take 2,000 Units by mouth daily.    [provider]  clopidogrel (PLAVIX) 75 MG tablet Take 1 tablet (75 mg total) by mouth daily with breakfast. 02/09/23   Baglia, Corrina, PA-C  collagenase (SANTYL) 250 UNIT/GM ointment Apply 1 Application topically See admin instructions. Applied to right great toe and bunion on the left foot daily. Bandage loosely.    [provider]  ezetimibe (ZETIA) 10 MG tablet Take 1 tablet (10 mg total) by mouth daily. Patient taking differently: Take 10 mg by mouth at bedtime. 02/09/23 02/09/24  Chuck Hint, MD  famotidine (PEPCID) 20 MG tablet Take 20 mg by mouth at bedtime.    [provider]  ferrous sulfate 220 (44 Fe) MG/5ML solution Take 330 mg by mouth 3 (three) times a week. 12/27/22   [provider]  folic acid (FOLVITE) 1 MG tablet Take 1 mg by mouth at bedtime.    [provider]  insulin aspart (NOVOLOG FLEXPEN) 100 UNIT/ML FlexPen Inject 2-10 Units into the skin See admin instructions. Inject 2-10 units into the skin three times a day with meals, per sliding scale:  Breakfast: BGL 80-199 = 8 units; 200-299 = 9 units; 300 or greater = 10 units Lunch: BGL 80-199 = 5 units; 200-299 = 6 units; 300 or greater = 7 units Supper/evening meal: BGL 80-199 = 2 units; 200-299 = 3 units; 300 or greater = 4 units    [provider]  insulin detemir (LEVEMIR FLEXTOUCH) 100 UNIT/ML FlexPen Inject 3-7 Units into the skin See admin instructions. Inject 7 units into the skin in the morning and 3 units at bedtime    [provider]  levETIRAcetam (KEPPRA) 500 MG tablet Take 500 mg by mouth in the morning and at  bedtime. 01/11/23   [provider]  liver oil-zinc oxide (DESITIN) 40 % ointment Apply topically 3 (three) times daily. Patient taking differently: Apply 1 Application topically as needed for irritation (BUTTOCKS). 09/16/21   Rolly Salter, MD  methotrexate (RHEUMATREX) 2.5 MG tablet Take 1 tablet (2.5 mg total) by mouth once a week. Caution:Chemotherapy. Protect from light. Patient taking differently: Take 12.5 mg by mouth every Monday. Caution:Chemotherapy. Protect from light. 01/27/22   Marinda Elk, MD  metoprolol tartrate (LOPRESSOR) 25 MG tablet Take 1 tablet (25 mg total) by mouth 2 (two) times daily. Patient taking differently: Take 12.5 mg by mouth as needed (if Systolic number is over 160 for over 2 hours). 01/20/22   Marinda Elk, MD  midodrine (PROAMATINE) 5 MG tablet Take 0.5 tablets (2.5 mg total) by mouth 3 (three) times  daily with meals. 10/05/22   Osvaldo Shipper, MD  Multiple Vitamin (MULTIVITAMIN WITH MINERALS) TABS tablet Take 1 tablet by mouth daily with breakfast.    [provider]  PLAQUENIL 200 MG tablet Take 200 mg by mouth at bedtime. 10/07/22   [provider]  predniSONE (DELTASONE) 5 MG tablet Take 5 mg by mouth daily with breakfast.    [provider]  SYNTHROID 100 MCG tablet Take 100 mcg by mouth daily before breakfast.    [provider]  triamcinolone ointment (KENALOG) 0.1 % Apply 1 Application topically 2 (two) times daily as needed (skin irritation- affected areas). 07/16/22   [provider]    Physical Exam: Vitals:   04/30/23 1958  BP: (!) 169/59  Pulse: (!) 57  Resp: 17  Temp: 97.9 F (36.6 C)  TempSrc: Oral  SpO2: 100%   *** Constitutional: NAD, calm, comfortable Eyes: PERRL, lids and conjunctivae normal ENMT: Mucous membranes are moist. Posterior pharynx clear of any exudate or lesions.Normal dentition.  Neck: normal, supple, no masses. Respiratory: clear to auscultation  bilaterally, no wheezing, no crackles. Normal respiratory effort. No accessory muscle use.  Cardiovascular: Regular rate and rhythm, no murmurs / rubs / gallops. No extremity edema. 2+ pedal pulses. Abdomen: no tenderness, no masses palpated. No hepatosplenomegaly. Bowel sounds positive.  Musculoskeletal: no clubbing / cyanosis. No joint deformity upper and lower extremities. Good ROM, no contractures. Normal muscle tone.  Skin: no rashes, lesions, ulcers. No induration Neurologic: CN 2-12 grossly intact. Sensation intact. Strength 5/5 in all 4.  Psychiatric: Normal judgment and insight. Alert and oriented x 3. Normal mood.   EKG: Personally reviewed. Sinus rhythm, rate 58, no acute ischemic changes.  Rate is slower when compared to previous.  Assessment/Plan Active Problems:   * No active hospital problems. *   *** No notes on file *** Assessment and Plan: Acute metabolic encephalopathy due to UTI: ***  Dementia: ***  Insulin-dependent type 2 diabetes: ***  Dermatomyositis: ***  Macrocytic anemia: Hemoglobin is stable.  Of note, labs indicated iron deficiency several months ago. *** Will recheck anemia panel.***  Hypothyroidism: ***  Peripheral artery disease: ***  History of orthostasis: ***  History of seizure: ***   DVT prophylaxis: ***  Code Status: ***  Family Communication: ***  Disposition Plan: ***  Consults called: ***  Severity of Illness: {Observation/Inpatient:21159}  Darreld Mclean MD [facility]  If 7PM-7AM, please contact night-coverage [email or web address]  04/30/2023, 11:30 PM

## 2023-05-01 NOTE — Hospital Course (Signed)
[name] [name] is a 89 y.o. female with medical history significant for dementia, insulin-dependent T2DM, dermatomyositis (on Plaquenil and chronic pred 5 mg daily), PAD, history of syncope due to orthostasis and vasovagal episodes, history of seizure, hypothyroidism, macrocytic anemia who is admitted for evaluation of acute metabolic encephalopathy.

## 2023-05-01 NOTE — ED Notes (Signed)
ED TO INPATIENT HANDOFF REPORT  ED Nurse Name and Phone #: Tia, RN   S Name/Age/Gender [name] [name] 89 y.o. female Room/Bed: WA23/WA23  Code Status   Code Status: Do not attempt resuscitation (DNR) PRE-ARREST INTERVENTIONS DESIRED  Home/SNF/Other Home Patient oriented to: self and place Is this baseline? Yes   Triage Complete: Triage complete  Chief Complaint Acute metabolic encephalopathy [G93.41]  Triage Note Ems- was sitting on the commode and had a syncope slid out.  Failure to thrive.     Allergies Allergies  Allergen Reactions   Nsaids Other (See Comments)    No NSAIDS due to kidney function- ESPECIALLY ibuprofen or Aleve   Biaxin [Clarithromycin] Hives   Statins Other (See Comments)    Muscle pain (myositis per daugher)   Bactrim [Sulfamethoxazole-Trimethoprim] Hives and Rash   Lisinopril Cough   Sulfa Antibiotics Hives, Rash and Other (See Comments)    NO sulfa-based meds!!     Level of Care/Admitting Diagnosis ED Disposition     ED Disposition  Admit   Condition  --   Comment  Hospital Area: [facility] [[id number]]  Level of Care: Telemetry [5]  Admit to tele based on following criteria: Eval of Syncope  May place patient in observation at [facility] or [facility] if equivalent level of care is available:: No  Covid Evaluation: Asymptomatic - no recent exposure (last 10 days) testing not required  Diagnosis: Acute metabolic encephalopathy [1610960]  Admitting Physician: Charlsie Quest [[id number]]  Attending Physician: Charlsie Quest [[id number]]          B Medical/Surgery History Past Medical History:  Diagnosis Date   Arthritis    "knees, legs" (06/10/2016)   Ascites    Chronic kidney disease    "related to my diabetes"   Chronic lower back pain    Dementia without behavioral disturbance (HCC)    Diabetes mellitus without complication (HCC)    Eczema    GERD (gastroesophageal reflux disease)    High cholesterol     Hyperlipidemia    Hypertension    Hypothyroidism    OAB (overactive bladder)    Osteoporosis    Thyroid disease    Type II diabetes mellitus (HCC)    Urticaria    Past Surgical History:  Procedure Laterality Date   ABDOMINAL AORTOGRAM W/LOWER EXTREMITY N/A 02/08/2023   Procedure: ABDOMINAL AORTOGRAM W/LOWER EXTREMITY;  Surgeon: Chuck Hint, MD;  Location: [facility] INVASIVE CV LAB;  Service: Vascular;  Laterality: N/A;   ABDOMINAL HYSTERECTOMY     KNEE ARTHROSCOPY     LAPAROTOMY N/A 12/03/2016   Procedure: EXPLORATORY LAPAROTOMY, LYSIS OF ADHESIONS;  Surgeon: Darnell Level, MD;  Location: [facility] ORS;  Service: General;  Laterality: N/A;   LOOP RECORDER INSERTION N/A 08/23/2019   Procedure: LOOP RECORDER INSERTION;  Surgeon: Marinus Maw, MD;  Location: [facility] INVASIVE CV LAB;  Service: Cardiovascular;  Laterality: N/A;   PERIPHERAL VASCULAR INTERVENTION  02/08/2023   Procedure: PERIPHERAL VASCULAR INTERVENTION;  Surgeon: Chuck Hint, MD;  Location: [facility] INVASIVE CV LAB;  Service: Vascular;;   vocal cord polyps       A IV Location/Drains/Wounds Patient Lines/Drains/Airways Status     Active Line/Drains/Airways     Name Placement date Placement time Site Days   Peripheral IV 04/30/23 20 G Right Forearm 04/30/23  2203  Forearm  1   Pressure Injury 10/02/22 Coccyx Medial Stage 1 -  Intact skin with non-blanchable redness of a localized area usually over  a bony prominence. 10/02/22  2055  -- 211   Pressure Injury 10/02/22 Heel Left;Right Stage 1 -  Intact skin with non-blanchable redness of a localized area usually over a bony prominence. Bilateral heel redness, prophylactic drsg applied, heels floating 10/02/22  2055  -- 211   Pressure Injury 10/02/22 Knee Anterior;Left Stage 2 -  Partial thickness loss of dermis presenting as a shallow open injury with a red, pink wound bed without slough. 2 open areas above left knee 10/02/22  2055  -- 211   Pressure Injury 10/02/22 Toe (Comment   which one) Anterior;Right Stage 1 -  Intact skin with non-blanchable redness of a localized area usually over a bony prominence. right bunion redness 10/02/22  2055  -- 211   Pressure Injury 10/02/22 Back Left;Upper Stage 1 -  Intact skin with non-blanchable redness of a localized area usually over a bony prominence. erythema to bony prominence of left shoulder blade 10/02/22  2055  -- 211   Pressure Injury 03/16/23 Coccyx Stage 1 -  Intact skin with non-blanchable redness of a localized area usually over a bony prominence. 03/16/23  2200  -- 46   Wound / Incision (Open or Dehisced) 09/10/22 Toe (Comment  which one) Anterior;Right sore on tip of big toe 09/10/22  1725  Toe (Comment  which one)  233   Wound / Incision (Open or Dehisced) 09/10/22 Toe (Comment  which one) Anterior;Left Wound on tip of big toe 09/10/22  1725  Toe (Comment  which one)  233   Wound / Incision (Open or Dehisced) 09/10/22 Foot Anterior;Left "bunion" per family, with open area 09/10/22  1725  Foot  233   Wound / Incision (Open or Dehisced) 09/10/22 Coccyx 09/10/22  1725  Coccyx  233   Wound / Incision (Open or Dehisced) Diabetic ulcer Anterior;Left --  --  --  --            Intake/Output Last 24 hours  Intake/Output Summary (Last 24 hours) at 05/01/2023 1220 Last data filed at 05/01/2023 4098 Gross per 24 hour  Intake 100 ml  Output --  Net 100 ml    Labs/Imaging Results for orders placed or performed during the hospital encounter of 04/30/23 (from the past 48 hour(s))  CBC WITH DIFFERENTIAL     Status: Abnormal   Collection Time: 04/30/23  9:46 PM  Result Value Ref Range   WBC 11.8 (H) 4.0 - 10.5 K/uL   RBC 2.88 (L) 3.87 - 5.11 MIL/uL   Hemoglobin 9.6 (L) 12.0 - 15.0 g/dL   HCT 11.9 (L) 14.7 - 82.9 %   MCV 101.4 (H) 80.0 - 100.0 fL   MCH 33.3 26.0 - 34.0 pg   MCHC 32.9 30.0 - 36.0 g/dL   RDW 56.2 13.0 - 86.5 %   Platelets 279 150 - 400 K/uL   nRBC 0.0 0.0 - 0.2 %   Neutrophils Relative % 82 %    Neutro Abs 9.7 (H) 1.7 - 7.7 K/uL   Lymphocytes Relative 9 %   Lymphs Abs 1.1 0.7 - 4.0 K/uL   Monocytes Relative 7 %   Monocytes Absolute 0.8 0.1 - 1.0 K/uL   Eosinophils Relative 1 %   Eosinophils Absolute 0.1 0.0 - 0.5 K/uL   Basophils Relative 0 %   Basophils Absolute 0.0 0.0 - 0.1 K/uL   Immature Granulocytes 1 %   Abs Immature Granulocytes 0.07 0.00 - 0.07 K/uL    Comment: Performed at [facility], [address] [place], [place] [address]  Comprehensive metabolic panel     Status: Abnormal   Collection Time: 04/30/23  9:46 PM  Result Value Ref Range   Sodium 133 (L) 135 - 145 mmol/L   Potassium 3.6 3.5 - 5.1 mmol/L   Chloride 94 (L) 98 - 111 mmol/L   CO2 31 22 - 32 mmol/L   Glucose, Bld 99 70 - 99 mg/dL    Comment: Glucose reference range applies only to samples taken after fasting for at least 8 hours.   BUN 20 8 - 23 mg/dL   Creatinine, Ser 4.09 0.44 - 1.00 mg/dL   Calcium 9.6 8.9 - 81.1 mg/dL   Total Protein 6.9 6.5 - 8.1 g/dL   Albumin 3.2 (L) 3.5 - 5.0 g/dL   AST 53 (H) 15 - 41 U/L   ALT 55 (H) 0 - 44 U/L   Alkaline Phosphatase 99 38 - 126 U/L   Total Bilirubin 0.3 0.3 - 1.2 mg/dL   GFR, Estimated >91 >47 mL/min    Comment: (NOTE) Calculated using the CKD-EPI Creatinine Equation (2021)    Anion gap 8 5 - 15    Comment: Performed at [facility], [address] [place], [place] [address]  Troponin I (High Sensitivity)     Status: None   Collection Time: 04/30/23  9:46 PM  Result Value Ref Range   Troponin I (High Sensitivity) 10 <18 ng/L    Comment: (NOTE) Elevated high sensitivity troponin I (hsTnI) values and significant  changes across serial measurements may suggest ACS but many other  chronic and acute conditions are known to elevate hsTnI results.  Refer to the "Links" section for chest pain algorithms and additional  guidance. Performed at [facility], [address], [place], [place]  [address]   CBG monitoring, ED     Status: None   Collection Time: 04/30/23 10:21 PM  Result Value Ref Range   Glucose-Capillary 95 70 - 99 mg/dL    Comment: Glucose reference range applies only to samples taken after fasting for at least 8 hours.  Urinalysis, Routine w reflex microscopic -Urine, Clean Catch     Status: Abnormal   Collection Time: 04/30/23 10:55 PM  Result Value Ref Range   Color, Urine AMBER (A) YELLOW    Comment: BIOCHEMICALS MAY BE AFFECTED BY COLOR   APPearance TURBID (A) CLEAR   Specific Gravity, Urine 1.013 1.005 - 1.030   pH 7.0 5.0 - 8.0   Glucose, UA NEGATIVE NEGATIVE mg/dL   Hgb urine dipstick SMALL (A) NEGATIVE   Bilirubin Urine NEGATIVE NEGATIVE   Ketones, ur NEGATIVE NEGATIVE mg/dL   Protein, ur 657 (A) NEGATIVE mg/dL   Nitrite POSITIVE (A) NEGATIVE   Leukocytes,Ua LARGE (A) NEGATIVE   RBC / HPF 21-50 0 - 5 RBC/hpf   WBC, UA >50 0 - 5 WBC/hpf   Bacteria, UA MANY (A) NONE SEEN   Squamous Epithelial / HPF 0-5 0 - 5 /HPF   WBC Clumps PRESENT    Mucus PRESENT    Budding Yeast PRESENT    Hyaline Casts, UA PRESENT     Comment: Performed at [facility], [address], [place], [place] [address]  Troponin I (High Sensitivity)     Status: None   Collection Time: 04/30/23 11:47 PM  Result Value Ref Range   Troponin I (High Sensitivity) 10 <18 ng/L    Comment: (NOTE) Elevated high sensitivity troponin I (hsTnI) values and  significant  changes across serial measurements may suggest ACS but many other  chronic and acute conditions are known to elevate hsTnI results.  Refer to the "Links" section for chest pain algorithms and additional  guidance. Performed at [facility], [address] [place], [place] [address]   CBC     Status: Abnormal   Collection Time: 05/01/23  5:17 AM  Result Value Ref Range   WBC 8.8 4.0 - 10.5 K/uL   RBC 2.73 (L) 3.87 - 5.11 MIL/uL   Hemoglobin 9.1 (L) 12.0 - 15.0 g/dL   HCT 96.2 (L)  95.2 - 46.0 %   MCV 100.4 (H) 80.0 - 100.0 fL   MCH 33.3 26.0 - 34.0 pg   MCHC 33.2 30.0 - 36.0 g/dL   RDW 84.1 32.4 - 40.1 %   Platelets 280 150 - 400 K/uL   nRBC 0.0 0.0 - 0.2 %    Comment: Performed at [facility], [address], [place], [place] [address]  Basic metabolic panel     Status: Abnormal   Collection Time: 05/01/23  5:17 AM  Result Value Ref Range   Sodium 134 (L) 135 - 145 mmol/L   Potassium 3.3 (L) 3.5 - 5.1 mmol/L   Chloride 94 (L) 98 - 111 mmol/L   CO2 29 22 - 32 mmol/L   Glucose, Bld 91 70 - 99 mg/dL    Comment: Glucose reference range applies only to samples taken after fasting for at least 8 hours.   BUN 17 8 - 23 mg/dL   Creatinine, Ser 3.66 0.44 - 1.00 mg/dL   Calcium 9.5 8.9 - 44.0 mg/dL   GFR, Estimated >34 >74 mL/min    Comment: (NOTE) Calculated using the CKD-EPI Creatinine Equation (2021)    Anion gap 11 5 - 15    Comment: Performed at [facility], [address], [place], [place] [address]  CBG monitoring, ED     Status: Abnormal   Collection Time: 05/01/23  7:55 AM  Result Value Ref Range   Glucose-Capillary 158 (H) 70 - 99 mg/dL    Comment: Glucose reference range applies only to samples taken after fasting for at least 8 hours.  CBG monitoring, ED     Status: Abnormal   Collection Time: 05/01/23  9:28 AM  Result Value Ref Range   Glucose-Capillary 198 (H) 70 - 99 mg/dL    Comment: Glucose reference range applies only to samples taken after fasting for at least 8 hours.  CBG monitoring, ED     Status: Abnormal   Collection Time: 05/01/23 11:42 AM  Result Value Ref Range   Glucose-Capillary 211 (H) 70 - 99 mg/dL    Comment: Glucose reference range applies only to samples taken after fasting for at least 8 hours.   DG CHEST PORT 1 VIEW  Result Date: 05/01/2023 CLINICAL DATA:  Acute encephalopathy, failure to thrive. EXAM: PORTABLE CHEST 1 VIEW COMPARISON:  03/16/2023. FINDINGS: The heart size and  mediastinal contours are within normal limits. There is atherosclerotic calcification of the aorta. No consolidation, effusion, or pneumothorax. A loop recorder device is present over the left chest. No acute osseous abnormality. IMPRESSION: No active disease. Electronically Signed   By: Thornell Sartorius M.D.   On: 05/01/2023 02:41    Pending Labs Unresulted Labs (From admission, onward)     Start     Ordered   05/02/23 0500  CBC  Daily,   R  05/01/23 0648   05/02/23 0500  Basic metabolic panel  Daily,   R      05/01/23 0648   04/30/23 2312  Urine Culture  Add-on,   AD       Question:  Indication  Answer:  Altered mental status (if no other cause identified)   04/30/23 2311            Vitals/Pain Today's Vitals   05/01/23 0759 05/01/23 1018 05/01/23 1044 05/01/23 1146  BP:  (!) 117/56    Pulse:  75    Resp:  18    Temp: (!) 97.5 F (36.4 C)   97.7 F (36.5 C)  TempSrc: Axillary   Axillary  SpO2:  100%    Weight:   56.7 kg   Height:   5\' 4"  (1.626 m)   PainSc:        Isolation Precautions No active isolations  Medications Medications  cefTRIAXone (ROCEPHIN) 1 g in sodium chloride 0.9 % 100 mL IVPB (has no administration in time range)  enoxaparin (LOVENOX) injection 40 mg (has no administration in time range)  sodium chloride flush (NS) 0.9 % injection 3 mL (3 mLs Intravenous Given 05/01/23 0940)  acetaminophen (TYLENOL) tablet 650 mg (has no administration in time range)    Or  acetaminophen (TYLENOL) suppository 650 mg (has no administration in time range)  ondansetron (ZOFRAN) tablet 4 mg (has no administration in time range)    Or  ondansetron (ZOFRAN) injection 4 mg (has no administration in time range)  senna-docusate (Senokot-S) tablet 1 tablet (has no administration in time range)  clopidogrel (PLAVIX) tablet 75 mg (75 mg Oral Given 05/01/23 0911)  ezetimibe (ZETIA) tablet 10 mg (has no administration in time range)  famotidine (PEPCID) tablet 20 mg (20 mg  Oral Given 05/01/23 0302)  folic acid (FOLVITE) tablet 1 mg (1 mg Oral Given 05/01/23 0305)  levETIRAcetam (KEPPRA) tablet 500 mg (500 mg Oral Given 05/01/23 0912)  midodrine (PROAMATINE) tablet 2.5 mg (2.5 mg Oral Given 05/01/23 1205)  hydroxychloroquine (PLAQUENIL) tablet 200 mg (200 mg Oral Given 05/01/23 0306)  predniSONE (DELTASONE) tablet 5 mg (5 mg Oral Given 05/01/23 0912)  levothyroxine (SYNTHROID) tablet 100 mcg (100 mcg Oral Given 05/01/23 0722)  insulin aspart (novoLOG) injection 0-9 Units (3 Units Subcutaneous Given 05/01/23 1204)  aspirin EC tablet 81 mg (81 mg Oral Given 05/01/23 0912)  cefTRIAXone (ROCEPHIN) 1 g in sodium chloride 0.9 % 100 mL IVPB (0 g Intravenous Stopped 05/01/23 0727)  potassium chloride SA (KLOR-CON M) CR tablet 40 mEq (40 mEq Oral Given 05/01/23 0911)    Mobility Per Family she can help pivot but is non-ambulatory      Focused Assessments Neuro Assessment Handoff:  Swallow screen pass?  N/A         Neuro Assessment:   Neuro Checks:      Has TPA been given? No If patient is a Neuro Trauma and patient is going to OR before floor call report to 4N Charge nurse: [phone] or [phone]   R Recommendations: See Admitting Provider Note  Report given to:   Additional Notes:

## 2023-05-02 DIAGNOSIS — Z794 Long term (current) use of insulin: Secondary | ICD-10-CM | POA: Diagnosis not present

## 2023-05-02 DIAGNOSIS — M3390 Dermatopolymyositis, unspecified, organ involvement unspecified: Secondary | ICD-10-CM | POA: Diagnosis not present

## 2023-05-02 DIAGNOSIS — E1122 Type 2 diabetes mellitus with diabetic chronic kidney disease: Secondary | ICD-10-CM | POA: Diagnosis not present

## 2023-05-02 DIAGNOSIS — R569 Unspecified convulsions: Secondary | ICD-10-CM | POA: Diagnosis not present

## 2023-05-02 DIAGNOSIS — E871 Hypo-osmolality and hyponatremia: Secondary | ICD-10-CM | POA: Diagnosis not present

## 2023-05-02 DIAGNOSIS — R400 Somnolence: Secondary | ICD-10-CM | POA: Diagnosis not present

## 2023-05-02 DIAGNOSIS — M810 Age-related osteoporosis without current pathological fracture: Secondary | ICD-10-CM | POA: Diagnosis not present

## 2023-05-02 DIAGNOSIS — Z681 Body mass index (BMI) 19 or less, adult: Secondary | ICD-10-CM | POA: Diagnosis not present

## 2023-05-02 DIAGNOSIS — Z79890 Hormone replacement therapy (postmenopausal): Secondary | ICD-10-CM | POA: Diagnosis not present

## 2023-05-02 DIAGNOSIS — N3000 Acute cystitis without hematuria: Secondary | ICD-10-CM

## 2023-05-02 DIAGNOSIS — I129 Hypertensive chronic kidney disease with stage 1 through stage 4 chronic kidney disease, or unspecified chronic kidney disease: Secondary | ICD-10-CM | POA: Diagnosis not present

## 2023-05-02 DIAGNOSIS — R55 Syncope and collapse: Secondary | ICD-10-CM

## 2023-05-02 DIAGNOSIS — D539 Nutritional anemia, unspecified: Secondary | ICD-10-CM | POA: Diagnosis not present

## 2023-05-02 DIAGNOSIS — E039 Hypothyroidism, unspecified: Secondary | ICD-10-CM | POA: Diagnosis not present

## 2023-05-02 DIAGNOSIS — Z79899 Other long term (current) drug therapy: Secondary | ICD-10-CM | POA: Diagnosis not present

## 2023-05-02 DIAGNOSIS — G9341 Metabolic encephalopathy: Secondary | ICD-10-CM | POA: Diagnosis not present

## 2023-05-02 DIAGNOSIS — B961 Klebsiella pneumoniae [K. pneumoniae] as the cause of diseases classified elsewhere: Secondary | ICD-10-CM | POA: Diagnosis not present

## 2023-05-02 DIAGNOSIS — E1151 Type 2 diabetes mellitus with diabetic peripheral angiopathy without gangrene: Secondary | ICD-10-CM | POA: Diagnosis not present

## 2023-05-02 DIAGNOSIS — E7800 Pure hypercholesterolemia, unspecified: Secondary | ICD-10-CM | POA: Diagnosis not present

## 2023-05-02 DIAGNOSIS — Z66 Do not resuscitate: Secondary | ICD-10-CM | POA: Diagnosis not present

## 2023-05-02 DIAGNOSIS — R627 Adult failure to thrive: Secondary | ICD-10-CM | POA: Diagnosis not present

## 2023-05-02 DIAGNOSIS — F0390 Unspecified dementia without behavioral disturbance: Secondary | ICD-10-CM | POA: Diagnosis not present

## 2023-05-02 DIAGNOSIS — E119 Type 2 diabetes mellitus without complications: Secondary | ICD-10-CM | POA: Diagnosis not present

## 2023-05-02 DIAGNOSIS — N182 Chronic kidney disease, stage 2 (mild): Secondary | ICD-10-CM | POA: Diagnosis not present

## 2023-05-02 DIAGNOSIS — K219 Gastro-esophageal reflux disease without esophagitis: Secondary | ICD-10-CM | POA: Diagnosis not present

## 2023-05-02 DIAGNOSIS — E876 Hypokalemia: Secondary | ICD-10-CM | POA: Diagnosis not present

## 2023-05-02 DIAGNOSIS — N390 Urinary tract infection, site not specified: Secondary | ICD-10-CM | POA: Diagnosis not present

## 2023-05-02 LAB — GLUCOSE, CAPILLARY
Glucose-Capillary: 434 mg/dL — ABNORMAL HIGH (ref 70–99)
Glucose-Capillary: 384 mg/dL — ABNORMAL HIGH (ref 70–99)
Glucose-Capillary: 304 mg/dL — ABNORMAL HIGH (ref 70–99)
Glucose-Capillary: 81 mg/dL (ref 70–99)
Glucose-Capillary: 161 mg/dL — ABNORMAL HIGH (ref 70–99)

## 2023-05-02 LAB — CBC
HCT: 29.9 % — ABNORMAL LOW (ref 36.0–46.0)
Hemoglobin: 9.6 g/dL — ABNORMAL LOW (ref 12.0–15.0)
MCH: 32.7 pg (ref 26.0–34.0)
MCHC: 32.1 g/dL (ref 30.0–36.0)
MCV: 101.7 fL — ABNORMAL HIGH (ref 80.0–100.0)
Platelets: 292 10*3/uL (ref 150–400)
RBC: 2.94 MIL/uL — ABNORMAL LOW (ref 3.87–5.11)
RDW: 14.7 % (ref 11.5–15.5)
WBC: 7.6 10*3/uL (ref 4.0–10.5)
nRBC: 0 % (ref 0.0–0.2)

## 2023-05-02 LAB — BASIC METABOLIC PANEL
Anion gap: 13 (ref 5–15)
BUN: 20 mg/dL (ref 8–23)
CO2: 28 mmol/L (ref 22–32)
Calcium: 9.5 mg/dL (ref 8.9–10.3)
Chloride: 92 mmol/L — ABNORMAL LOW (ref 98–111)
Creatinine, Ser: 0.8 mg/dL (ref 0.44–1.00)
GFR, Estimated: >60 mL/min (ref >60)
Glucose, Bld: 469 mg/dL — ABNORMAL HIGH (ref 70–99)
Potassium: 4 mmol/L (ref 3.5–5.1)
Sodium: 133 mmol/L — ABNORMAL LOW (ref 135–145)

## 2023-05-02 MED ORDER — INSULIN ASPART 100 UNIT/ML IJ SOLN
0.0000 [IU] | Freq: Every day | INTRAMUSCULAR | Status: DC
  Administered 2023-05-03: 5 [IU] via SUBCUTANEOUS

## 2023-05-02 MED ORDER — INSULIN GLARGINE-YFGN 100 UNIT/ML ~~LOC~~ SOLN
7.0000 [IU] | Freq: Every day | SUBCUTANEOUS | Status: DC
  Administered 2023-05-02: 7 [IU] via SUBCUTANEOUS
  Filled 2023-05-02 (×2): qty 0.07

## 2023-05-02 MED ORDER — INSULIN ASPART 100 UNIT/ML IJ SOLN
0.0000 [IU] | Freq: Three times a day (TID) | INTRAMUSCULAR | Status: DC
  Administered 2023-05-02 (×2): 15 [IU] via SUBCUTANEOUS
  Administered 2023-05-03: 5 [IU] via SUBCUTANEOUS

## 2023-05-02 MED ORDER — INSULIN ASPART 100 UNIT/ML IJ SOLN: 0.0000 [IU] | Freq: Three times a day (TID) | INTRAMUSCULAR | Status: DC

## 2023-05-02 MED ORDER — INSULIN ASPART 100 UNIT/ML IJ SOLN
10.0000 [IU] | Freq: Once | INTRAMUSCULAR | Status: AC
  Administered 2023-05-02: 10 [IU] via SUBCUTANEOUS

## 2023-05-02 NOTE — TOC Initial Note (Signed)
Transition of Care [facility]) - Initial/Assessment Note    Patient Details  Name: [name] [name] MRN: [id number] Date of Birth: 10-18-1933  Transition of Care [facility]) CM/SW Contact:    Otelia Santee, LCSW Phone Number: 05/02/2023, 10:53 AM  Clinical Narrative:                 Pt lives with daughter and receives HHPT/OT/RN w/ [facility]. ROC orders will need to be placed at discharge. Pt has Gilmer Mor, RW, wheelchair, and BSC at home. Pt has no further DME needs. [name] declines PTAR home and shares that family will provide transportation for her at discharge.   Expected Discharge Plan: Home w Home Health Services Barriers to Discharge: Continued Medical Work up   Patient Goals and CMS Choice Patient states their goals for this hospitalization and ongoing recovery are:: For pt to return home CMS Medicare.gov Compare Post Acute Care list provided to:: Patient Represenative (must comment) Choice offered to / list presented to : Adult Children [place] ownership interest in [facility].provided to::  (NA)    Expected Discharge Plan and Services In-house Referral: NA Discharge Planning Services: NA Post Acute Care Choice: Resumption of Svcs/PTA Provider Living arrangements for the past 2 months: Single Family Home                 DME Arranged: N/A DME Agency: NA                  Prior Living Arrangements/Services Living arrangements for the past 2 months: Single Family Home Lives with:: Adult Children Patient language and need for interpreter reviewed:: Yes Do you feel safe going back to the place where you live?: Yes      Need for Family Participation in Patient Care: Yes (Comment) Care giver support system in place?: Yes (comment) Current home services: DME, Home OT, Home PT, Home RN ([place], RW, BSC, wheelchair, [facility] w/ [facility]) Criminal Activity/Legal Involvement Pertinent to Current Situation/Hospitalization: No - Comment as needed  Activities of Daily  Living   ADL Screening (condition at time of admission) Independently performs ADLs?: No Does the patient have a NEW difficulty with bathing/dressing/toileting/self-feeding that is expected to last >3 days?: No Does the patient have a NEW difficulty with getting in/out of bed, walking, or climbing stairs that is expected to last >3 days?: No Does the patient have a NEW difficulty with communication that is expected to last >3 days?: No Is the patient deaf or have difficulty hearing?: Yes Does the patient have difficulty seeing, even when wearing glasses/contacts?: No Does the patient have difficulty concentrating, remembering, or making decisions?: Yes  Permission Sought/Granted   Permission granted to share information with : No              Emotional Assessment   Attitude/Demeanor/Rapport: Unable to Assess Affect (typically observed): Unable to Assess Orientation: : Oriented to Place, Oriented to Self Alcohol / Substance Use: Not Applicable Psych Involvement: No (comment)  Admission diagnosis:  Somnolence [R40.0] Acute cystitis without hematuria [N30.00] Syncope, unspecified syncope type [R55] Acute metabolic encephalopathy [G93.41] Patient Active Problem List   Diagnosis Date Noted   Acute metabolic encephalopathy 04/30/2023   Insulin dependent type 2 diabetes mellitus (HCC) 04/30/2023   Macrocytic anemia 04/30/2023   CAP (community acquired pneumonia) 03/16/2023   COVID-19 02/24/2023   Weakness 02/24/2023   AMS (altered mental status) 02/15/2023   Paraparesis (HCC) 01/04/2023   Other specified abnormal findings of blood chemistry 01/04/2023  Cervical radiculitis 01/04/2023   Cervical spinal stenosis 01/04/2023   UTI (urinary tract infection) 10/03/2022   Sepsis due to urinary tract infection (HCC) 10/02/2022   Normocytic anemia 07/27/2022   Dementia without behavioral disturbance (HCC) 07/27/2022   Malnutrition of moderate degree 05/11/2022   PNA (pneumonia)  01/19/2022   Syncope and collapse 09/13/2021   Dermatopolymyositis, unspecified, organ involvement unspecified (HCC) 09/13/2021   Poor appetite 09/13/2021   Hyponatremia 09/13/2021   Uncontrolled diabetes mellitus with hypoglycemia without coma, with long-term current use of insulin (HCC) 09/13/2021   Sacral ulcer (HCC) 09/13/2021   Oropharyngeal dysphagia 05/15/2021   Allergic conjunctivitis of both eyes 03/15/2020   Peripheral artery disease (HCC) 10/24/2019   Malnutrition (HCC) 10/10/2019   History of seizure 10/08/2019   Right shoulder pain 08/21/2019   Leukocytosis 08/21/2019   Acute encephalopathy 08/18/2019   Enterocolitis 08/18/2019   Hyperglycemia 08/18/2019   Chronic kidney disease    Pressure injury of skin 12/09/2016   SBO (small bowel obstruction) s/p lysis of adhesions 12/03/2016 12/04/2016   Acute urinary retention 11/27/2016   Ascites 11/27/2016   Abdominal distension    Facial droop    Hyperkalemia 11/25/2016   Sepsis (HCC) 11/25/2016   Acute renal failure superimposed on stage 2 chronic kidney disease (HCC) 11/25/2016   Delirium 11/25/2016   Age-related osteoporosis without current pathological fracture 10/01/2016   GERD (gastroesophageal reflux disease) 06/10/2016   Insomnia 06/10/2016   Viral gastroenteritis    Right hip pain 05/06/2015   Vaginal atrophy 11/01/2013   ECZEMA 07/24/2008   INTERMITTENT VERTIGO 07/24/2008   Constipation 11/16/2007   INSOMNIA 08/12/2007   Oral thrush 08/03/2007   Hypertension with hx of orthostatic hypotension 02/14/2007   Allergic rhinitis due to allergen 02/14/2007   Hypothyroidism 01/26/2007   Uncontrolled type 2 diabetes mellitus with hypoglycemia, with long-term current use of insulin (HCC) 01/26/2007   GERD 01/26/2007   DEGENERATIVE JOINT DISEASE 01/26/2007   PCP:  [name], MD Pharmacy:   [id number] - [place], [place] - [address] AT [facility] OF [address] [place] [place] [address] Phone: [phone] Fax: [phone]     Social Determinants of Health (SDOH) Social History: SDOH Screenings   Food Insecurity: No Food Insecurity (05/01/2023)  Housing: Low Risk  (05/01/2023)  Transportation Needs: No Transportation Needs (05/01/2023)  Utilities: Not At Risk (05/01/2023)  Social Connections: Unknown (12/02/2021)   Received from [facility]  Tobacco Use: Medium Risk (05/01/2023)   SDOH Interventions:     Readmission Risk Interventions    03/17/2023    2:50 PM 02/16/2023   11:04 AM 10/04/2022   11:17 AM  Readmission Risk Prevention Plan  Transportation Screening Complete Complete Complete  PCP or Specialist Appt within 3-5 Days   Complete  HRI or Home Care Consult   Complete  Social Work Consult for Recovery Care Planning/Counseling   Complete  Palliative Care Screening   Not Applicable  Medication Review Oceanographer) Complete  Complete  PCP or Specialist appointment within 3-5 days of discharge Complete    HRI or Home Care Consult Complete Complete   SW Recovery Care/Counseling Consult Complete Complete   Palliative Care Screening Not Applicable    Skilled Nursing Facility Patient Refused

## 2023-05-02 NOTE — Plan of Care (Signed)
  Problem: Metabolic: Goal: Ability to maintain appropriate glucose levels will improve Outcome: Progressing   Problem: Nutrition: Goal: Adequate nutrition will be maintained Outcome: Progressing   Problem: Elimination: Goal: Will not experience complications related to bowel motility Outcome: Progressing Goal: Will not experience complications related to urinary retention Outcome: Progressing   Problem: Safety: Goal: Ability to remain free from injury will improve Outcome: Progressing   Problem: Skin Integrity: Goal: Risk for impaired skin integrity will decrease Outcome: Progressing

## 2023-05-02 NOTE — Progress Notes (Signed)
[facility]                                                                              [name] [name], is a 89 y.o. female, DOB - [birth year], [id number] Admit date - 04/30/2023    Outpatient Primary MD for the patient is [name], MD  LOS - 0  days  Chief Complaint  Patient presents with   Syncope        Brief summary   Patient is a 89 year old female with dementia, IDDM, dermatomyositis on Plaquenil and chronic prednisone 5 mg daily, PAD, prior history of syncope due to orthostasis and vasovagal episodes, seizure, hypothyroidism, macrocytic anemia presented with altered mental status, near syncopal episode. At baseline, chronically bed/wheelchair bound due to history of dermatomyositis.  Per daughter, was recently treated for UTI with ?nitrofurantoin and completed antibiotics this past Monday on 04/26/2023.  On the day of admission, patient was having a BM and per family was straining and subsequently had a near syncopal episode with a vomiting episode afterward.  Since then she was noted to be more lethargic and less interactive.  In ED BP 169/59, pulse 60, RR 17, temp 97.9, O2 sats 100% on room air Creatinine 0.8, WBCs 11.8, hemoglobin 9.6 UA positive for UTI   Assessment & Plan    Principal Problem:   Acute metabolic encephalopathy in the setting of UTI and dementia -Patient has baseline dementia, presented with acute change in mentation most likely due to UTI -Check x-ray showed no active disease, -Much more alert and awake today, pleasant, following commands   Active Problems:   UTI (urinary tract infection) -Urine culture showed more than 100,000 colonies of Klebsiella -Continue IV Rocephin, will follow sensitivities  Near syncope -Likely due to UTI, patient has a prior history of orthostasis and vasovagal episodes.  She did not lose consciousness.  Had episode of emesis afterwards.  Likely vagal episode. - Continue midodrine -PT evaluation  today, overall improving    Insulin dependent type 2 diabetes mellitus (HCC), uncontrolled with hyperglycemia -Hemoglobin A1c 8.8 on 02/26/2023, will recheck CBG (last 3)  Recent Labs    05/01/23 2105 05/02/23 0743 05/02/23 1140  GLUCAP 260* 434* 384*   CBGs elevated, gave 10 units NovoLog subcu this morning for CBG of 434 -Changed NovoLog to moderate sliding scale insulin, added Semglee 7 units daily  Hypokalemia -Replaced     History of seizure -Continue Keppra    Hypertension with hx of orthostatic hypotension -Continue midodrine 2.5 mg 3 times daily    Hypothyroidism -Continue Synthroid 100 mcg daily, check TSH, fT4    Dermatopolymyositis, unspecified, organ involvement unspecified (HCC) -Continue Plaquenil, prednisone 5 mg daily     Peripheral artery disease (HCC) -Status post angioplasty of bilateral common iliac artery stenosis in 01/2023 -Continue aspirin, Plavix, Zetia    Macrocytic anemia -Obtain B12, folate -H&H at baseline  Chronic hyponatremia -Continue to follow, sodium currently at baseline  Estimated body mass index is 19.8 kg/m as calculated from the following:   Height as of this encounter: 5\' 5"  (1.651 m).   Weight as of this encounter: 54 kg.  Code  Status: DNR DVT Prophylaxis:  enoxaparin (LOVENOX) injection 40 mg Start: 05/01/23 2200   Level of Care: Level of care: Telemetry Family Communication: Updated patient's daughter at the bedside Disposition Plan:      Remains inpatient appropriate:      Procedures:  None  Consultants:   None  Antimicrobials:   Anti-infectives (From admission, onward)    Start     Dose/Rate Route Frequency Ordered Stop   05/01/23 2200  cefTRIAXone (ROCEPHIN) 1 g in sodium chloride 0.9 % 100 mL IVPB        1 g 200 mL/hr over 30 Minutes Intravenous Every 24 hours 05/01/23 0009     05/01/23 0015  hydroxychloroquine (PLAQUENIL) tablet 200 mg        200 mg Oral Daily at bedtime 05/01/23 0010     04/30/23  2315  cefTRIAXone (ROCEPHIN) 1 g in sodium chloride 0.9 % 100 mL IVPB        1 g 200 mL/hr over 30 Minutes Intravenous  Once 04/30/23 2311 05/01/23 0727          Medications  aspirin EC  81 mg Oral Daily   clopidogrel  75 mg Oral Q breakfast   enoxaparin (LOVENOX) injection  40 mg Subcutaneous Q24H   ezetimibe  10 mg Oral QHS   famotidine  20 mg Oral QHS   folic acid  1 mg Oral QHS   hydroxychloroquine  200 mg Oral QHS   insulin aspart  0-15 Units Subcutaneous TID WC   insulin aspart  0-5 Units Subcutaneous QHS   insulin glargine-yfgn  7 Units Subcutaneous Daily   levETIRAcetam  500 mg Oral BID   levothyroxine  100 mcg Oral Q0600   midodrine  2.5 mg Oral TID WC   predniSONE  5 mg Oral Q breakfast   sodium chloride flush  3 mL Intravenous Q12H      Subjective:   [name] [name] was seen and examined today.  Much more alert and awake today, following commands.  Pleasant.  Watching TV.  No acute issues.  No fevers or chills, no pain.    Objective:   Vitals:   05/01/23 1415 05/01/23 1821 05/01/23 2207 05/02/23 0215  BP: 114/63 (!) 165/58 (!) 164/50 (!) 175/55  Pulse: (!) 54 (!) 57 65 65  Resp: 16  20 17   Temp: (!) 97.4 F (36.3 C) 98.1 F (36.7 C) 98 F (36.7 C) 98.2 F (36.8 C)  TempSrc: Oral Oral Oral Oral  SpO2: 100% 100% 100% 98%  Weight: 54 kg     Height: 5\' 5"  (1.651 m)       Intake/Output Summary (Last 24 hours) at 05/02/2023 1311 Last data filed at 05/02/2023 0546 Gross per 24 hour  Intake 660 ml  Output 300 ml  Net 360 ml     Wt Readings from Last 3 Encounters:  05/01/23 54 kg  03/25/23 56.7 kg  02/15/23 57.2 kg   Physical Exam General: Alert and oriented self, NAD, pleasant Cardiovascular: S1 S2 clear, RRR.  Respiratory: CTAB, no wheezing Gastrointestinal: Soft, nontender, nondistended, NBS Ext: no pedal edema bilaterally Neuro: no new deficits, moving all 4 extremities spontaneously Psych: dementia     Data Reviewed:  I have personally  reviewed following labs    CBC Lab Results  Component Value Date   WBC 7.6 05/02/2023   RBC 2.94 (L) 05/02/2023   HGB 9.6 (L) 05/02/2023   HCT 29.9 (L) 05/02/2023   MCV 101.7 (H) 05/02/2023  MCH 32.7 05/02/2023   PLT 292 05/02/2023   MCHC 32.1 05/02/2023   RDW 14.7 05/02/2023   LYMPHSABS 1.1 04/30/2023   MONOABS 0.8 04/30/2023   EOSABS 0.1 04/30/2023   BASOSABS 0.0 04/30/2023     Last metabolic panel Lab Results  Component Value Date   NA 133 (L) 05/02/2023   K 4.0 05/02/2023   CL 92 (L) 05/02/2023   CO2 28 05/02/2023   BUN 20 05/02/2023   CREATININE 0.80 05/02/2023   GLUCOSE 469 (H) 05/02/2023   GFRNONAA >60 05/02/2023   GFRAA 54 (L) 10/12/2019   CALCIUM 9.5 05/02/2023   PHOS 2.9 02/15/2023   PROT 6.9 04/30/2023   ALBUMIN 3.2 (L) 04/30/2023   BILITOT 0.3 04/30/2023   ALKPHOS 99 04/30/2023   AST 53 (H) 04/30/2023   ALT 55 (H) 04/30/2023   ANIONGAP 13 05/02/2023    CBG (last 3)  Recent Labs    05/01/23 2105 05/02/23 0743 05/02/23 1140  GLUCAP 260* 434* 384*      Coagulation Profile: No results for input(s): "INR", "PROTIME" in the last 168 hours.   Radiology Studies: I have personally reviewed the imaging studies  DG CHEST PORT 1 VIEW  Result Date: 05/01/2023 CLINICAL DATA:  Acute encephalopathy, failure to thrive. EXAM: PORTABLE CHEST 1 VIEW COMPARISON:  03/16/2023. FINDINGS: The heart size and mediastinal contours are within normal limits. There is atherosclerotic calcification of the aorta. No consolidation, effusion, or pneumothorax. A loop recorder device is present over the left chest. No acute osseous abnormality. IMPRESSION: No active disease. Electronically Signed   By: Thornell Sartorius M.D.   On: 05/01/2023 02:41       Keyleigh Manninen M.D. [facility] 05/02/2023, 1:11 PM  Available via Epic secure chat 7am-7pm After 7 pm, please refer to night coverage provider listed on amion.

## 2023-05-02 NOTE — Plan of Care (Signed)
  Problem: Education: Goal: Knowledge of risk factors and measures for prevention of condition will improve Outcome: Progressing   Problem: Coping: Goal: Psychosocial and spiritual needs will be supported Outcome: Progressing   Problem: Respiratory: Goal: Will maintain a patent airway Outcome: Progressing Goal: Complications related to the disease process, condition or treatment will be avoided or minimized Outcome: Progressing   Problem: Education: Goal: Understanding of CV disease, CV risk reduction, and recovery process will improve Outcome: Progressing Goal: Individualized Educational Video(s) Outcome: Progressing   Problem: Activity: Goal: Ability to return to baseline activity level will improve Outcome: Progressing   Problem: Cardiovascular: Goal: Ability to achieve and maintain adequate cardiovascular perfusion will improve Outcome: Progressing Goal: Vascular access site(s) Level 0-1 will be maintained Outcome: Progressing   Problem: Health Behavior/Discharge Planning: Goal: Ability to safely manage health-related needs after discharge will improve Outcome: Progressing   Problem: Education: Goal: Ability to describe self-care measures that may prevent or decrease complications (Diabetes Survival Skills Education) will improve Outcome: Progressing Goal: Individualized Educational Video(s) Outcome: Progressing   Problem: Coping: Goal: Ability to adjust to condition or change in health will improve Outcome: Progressing   Problem: Fluid Volume: Goal: Ability to maintain a balanced intake and output will improve Outcome: Progressing   Problem: Health Behavior/Discharge Planning: Goal: Ability to identify and utilize available resources and services will improve Outcome: Progressing Goal: Ability to manage health-related needs will improve Outcome: Progressing   Problem: Metabolic: Goal: Ability to maintain appropriate glucose levels will improve Outcome:  Progressing   Problem: Nutritional: Goal: Maintenance of adequate nutrition will improve Outcome: Progressing Goal: Progress toward achieving an optimal weight will improve Outcome: Progressing   Problem: Skin Integrity: Goal: Risk for impaired skin integrity will decrease Outcome: Progressing   Problem: Tissue Perfusion: Goal: Adequacy of tissue perfusion will improve Outcome: Progressing   Problem: Education: Goal: Knowledge of General Education information will improve Description: Including pain rating scale, medication(s)/side effects and non-pharmacologic comfort measures Outcome: Progressing   Problem: Health Behavior/Discharge Planning: Goal: Ability to manage health-related needs will improve Outcome: Progressing   Problem: Clinical Measurements: Goal: Ability to maintain clinical measurements within normal limits will improve Outcome: Progressing Goal: Will remain free from infection Outcome: Progressing Goal: Diagnostic test results will improve Outcome: Progressing Goal: Respiratory complications will improve Outcome: Progressing Goal: Cardiovascular complication will be avoided Outcome: Progressing   Problem: Activity: Goal: Risk for activity intolerance will decrease Outcome: Progressing   Problem: Nutrition: Goal: Adequate nutrition will be maintained Outcome: Progressing   Problem: Coping: Goal: Level of anxiety will decrease Outcome: Progressing   Problem: Elimination: Goal: Will not experience complications related to bowel motility Outcome: Progressing Goal: Will not experience complications related to urinary retention Outcome: Progressing   Problem: Pain Managment: Goal: General experience of comfort will improve Outcome: Progressing   Problem: Safety: Goal: Ability to remain free from injury will improve Outcome: Progressing   Problem: Skin Integrity: Goal: Risk for impaired skin integrity will decrease Outcome: Progressing

## 2023-05-03 DIAGNOSIS — R55 Syncope and collapse: Secondary | ICD-10-CM | POA: Diagnosis not present

## 2023-05-03 DIAGNOSIS — R400 Somnolence: Secondary | ICD-10-CM | POA: Diagnosis not present

## 2023-05-03 DIAGNOSIS — G9341 Metabolic encephalopathy: Secondary | ICD-10-CM | POA: Diagnosis not present

## 2023-05-03 DIAGNOSIS — E119 Type 2 diabetes mellitus without complications: Secondary | ICD-10-CM

## 2023-05-03 DIAGNOSIS — N3000 Acute cystitis without hematuria: Secondary | ICD-10-CM | POA: Diagnosis not present

## 2023-05-03 DIAGNOSIS — Z794 Long term (current) use of insulin: Secondary | ICD-10-CM

## 2023-05-03 LAB — BASIC METABOLIC PANEL
Anion gap: 11 (ref 5–15)
BUN: 19 mg/dL (ref 8–23)
CO2: 27 mmol/L (ref 22–32)
Calcium: 9.6 mg/dL (ref 8.9–10.3)
Chloride: 96 mmol/L — ABNORMAL LOW (ref 98–111)
Creatinine, Ser: 0.68 mg/dL (ref 0.44–1.00)
GFR, Estimated: >60 mL/min (ref >60)
Glucose, Bld: 201 mg/dL — ABNORMAL HIGH (ref 70–99)
Potassium: 3.6 mmol/L (ref 3.5–5.1)
Sodium: 134 mmol/L — ABNORMAL LOW (ref 135–145)

## 2023-05-03 LAB — CBC
HCT: 30.7 % — ABNORMAL LOW (ref 36.0–46.0)
Hemoglobin: 10 g/dL — ABNORMAL LOW (ref 12.0–15.0)
MCH: 32.8 pg (ref 26.0–34.0)
MCHC: 32.6 g/dL (ref 30.0–36.0)
MCV: 100.7 fL — ABNORMAL HIGH (ref 80.0–100.0)
Platelets: 313 10*3/uL (ref 150–400)
RBC: 3.05 MIL/uL — ABNORMAL LOW (ref 3.87–5.11)
RDW: 14.6 % (ref 11.5–15.5)
WBC: 6.6 10*3/uL (ref 4.0–10.5)
nRBC: 0 % (ref 0.0–0.2)

## 2023-05-03 LAB — GLUCOSE, CAPILLARY
Glucose-Capillary: 185 mg/dL — ABNORMAL HIGH (ref 70–99)
Glucose-Capillary: 206 mg/dL — ABNORMAL HIGH (ref 70–99)
Glucose-Capillary: 93 mg/dL (ref 70–99)
Glucose-Capillary: 175 mg/dL — ABNORMAL HIGH (ref 70–99)
Glucose-Capillary: 383 mg/dL — ABNORMAL HIGH (ref 70–99)

## 2023-05-03 LAB — VITAMIN B12: Vitamin B-12: 2144 pg/mL — ABNORMAL HIGH (ref 180–914)

## 2023-05-03 LAB — URINE CULTURE: Culture: >=100000 — AB

## 2023-05-03 LAB — FOLATE: Folate: >40 ng/mL (ref >5.9)

## 2023-05-03 LAB — HEMOGLOBIN A1C
Hgb A1c MFr Bld: 8.3 % — ABNORMAL HIGH (ref 4.8–5.6)
Mean Plasma Glucose: 191.51 mg/dL

## 2023-05-03 LAB — T4, FREE: Free T4: 1.3 ng/dL — ABNORMAL HIGH (ref 0.61–1.12)

## 2023-05-03 LAB — TSH: TSH: 1.411 u[IU]/mL (ref 0.350–4.500)

## 2023-05-03 MED ORDER — INSULIN GLARGINE-YFGN 100 UNIT/ML ~~LOC~~ SOLN
10.0000 [IU] | Freq: Every day | SUBCUTANEOUS | Status: DC
  Administered 2023-05-03 – 2023-05-05 (×3): 10 [IU] via SUBCUTANEOUS
  Filled 2023-05-03 (×3): qty 0.1

## 2023-05-03 MED ORDER — INSULIN ASPART 100 UNIT/ML IJ SOLN
3.0000 [IU] | Freq: Three times a day (TID) | INTRAMUSCULAR | Status: DC
  Administered 2023-05-03 – 2023-05-05 (×5): 3 [IU] via SUBCUTANEOUS

## 2023-05-03 MED ORDER — INSULIN ASPART 100 UNIT/ML IJ SOLN
0.0000 [IU] | Freq: Three times a day (TID) | INTRAMUSCULAR | Status: DC
  Administered 2023-05-03: 2 [IU] via SUBCUTANEOUS
  Administered 2023-05-04: 5 [IU] via SUBCUTANEOUS
  Administered 2023-05-04: 2 [IU] via SUBCUTANEOUS
  Administered 2023-05-04: 3 [IU] via SUBCUTANEOUS
  Administered 2023-05-05: 1 [IU] via SUBCUTANEOUS
  Administered 2023-05-05: 2 [IU] via SUBCUTANEOUS

## 2023-05-03 NOTE — Progress Notes (Signed)
[facility]                                                                              [name] [name], is a 89 y.o. female, DOB - 1934/07/17, [id number] Admit date - 04/30/2023    Outpatient Primary MD for the patient is [name], MD  LOS - 1  days  Chief Complaint  Patient presents with   Syncope        Brief summary   Patient is a 89 year old female with dementia, IDDM, dermatomyositis on Plaquenil and chronic prednisone 5 mg daily, PAD, prior history of syncope due to orthostasis and vasovagal episodes, seizure, hypothyroidism, macrocytic anemia presented with altered mental status, near syncopal episode. At baseline, chronically bed/wheelchair bound due to history of dermatomyositis.  Per daughter, was recently treated for UTI with ?nitrofurantoin and completed antibiotics this past Monday on 04/26/2023.  On the day of admission, patient was having a BM and per family was straining and subsequently had a near syncopal episode with a vomiting episode afterward.  Since then she was noted to be more lethargic and less interactive.  In ED BP 169/59, pulse 60, RR 17, temp 97.9, O2 sats 100% on room air Creatinine 0.8, WBCs 11.8, hemoglobin 9.6 UA positive for UTI   Assessment & Plan    Principal Problem:   Acute metabolic encephalopathy in the setting of UTI and dementia -Patient has baseline dementia, presented with acute change in mentation most likely due to UTI -Check x-ray showed no active disease, -Today somewhat lethargic but no acute issues no fevers.   Active Problems:   UTI (urinary tract infection) -Urine culture showed more than 100,000 colonies of Klebsiella, sensitive to IV Rocephin -Patient was on nitrofurantoin prior to admission, culture shows resistance -Continue IV Rocephin  Near syncope -Likely due to UTI, patient has a prior history of orthostasis and vasovagal episodes.  She did not lose consciousness.  Had episode of emesis  afterwards.  Likely vagal episode. - Continue midodrine    Insulin dependent type 2 diabetes mellitus (HCC), uncontrolled with hyperglycemia -Hemoglobin A1c 8.3 CBG (last 3)  Recent Labs    05/03/23 0258 05/03/23 0746 05/03/23 1137  GLUCAP 185* 206* 93   -Increase Semglee to 10 units daily, NovoLog 3 units 3 times daily AC, sliding scale sensitive  Hypokalemia -Replace prn    History of seizure -Continue Keppra    Hypertension with hx of orthostatic hypotension -Continue midodrine 2.5 mg 3 times daily    Hypothyroidism -Continue Synthroid 100 mcg daily, TSH 1.4, free T4 1.3    Dermatopolymyositis, unspecified, organ involvement unspecified (HCC) -Continue Plaquenil, prednisone 5 mg daily     Peripheral artery disease (HCC) -Status post angioplasty of bilateral common iliac artery stenosis in 01/2023 -Continue aspirin, Plavix, Zetia    Macrocytic anemia -Folic acid and > 40, B12 2144 -H&H at baseline  Chronic hyponatremia -Continue to follow, sodium currently at baseline  Estimated body mass index is 19.8 kg/m as calculated from the following:   Height as of this encounter: 5\' 5"  (1.651 m).   Weight as of this encounter: 54 kg.  Code Status:  DNR DVT Prophylaxis:  enoxaparin (LOVENOX) injection 40 mg Start: 05/01/23 2200   Level of Care: Level of care: Med-Surg Family Communication: Updated patient's daughter on the phone  Disposition Plan:      Remains inpatient appropriate:      Procedures:  None  Consultants:   None  Antimicrobials:   Anti-infectives (From admission, onward)    Start     Dose/Rate Route Frequency Ordered Stop   05/01/23 2200  cefTRIAXone (ROCEPHIN) 1 g in sodium chloride 0.9 % 100 mL IVPB        1 g 200 mL/hr over 30 Minutes Intravenous Every 24 hours 05/01/23 0009     05/01/23 0015  hydroxychloroquine (PLAQUENIL) tablet 200 mg        200 mg Oral Daily at bedtime 05/01/23 0010     04/30/23 2315  cefTRIAXone (ROCEPHIN) 1 g in  sodium chloride 0.9 % 100 mL IVPB        1 g 200 mL/hr over 30 Minutes Intravenous  Once 04/30/23 2311 05/01/23 0727          Medications  aspirin EC  81 mg Oral Daily   clopidogrel  75 mg Oral Q breakfast   enoxaparin (LOVENOX) injection  40 mg Subcutaneous Q24H   ezetimibe  10 mg Oral QHS   famotidine  20 mg Oral QHS   folic acid  1 mg Oral QHS   hydroxychloroquine  200 mg Oral QHS   insulin aspart  0-5 Units Subcutaneous QHS   insulin aspart  0-9 Units Subcutaneous TID WC   insulin aspart  3 Units Subcutaneous TID WC   insulin glargine-yfgn  10 Units Subcutaneous Daily   levETIRAcetam  500 mg Oral BID   levothyroxine  100 mcg Oral Q0600   midodrine  2.5 mg Oral TID WC   predniSONE  5 mg Oral Q breakfast   sodium chloride flush  3 mL Intravenous Q12H      Subjective:   [name] [name] was seen and examined today.  Somewhat lethargic today appears tired.  Easily arousable and smiles.  Follows commands.  No acute issues.   Objective:   Vitals:   05/02/23 1431 05/02/23 1927 05/03/23 0440 05/03/23 1320  BP: (!) 124/59 (!) 138/54 (!) 177/71 (!) 131/54  Pulse: (!) 51 62 (!) 59 (!) 57  Resp: 14   15  Temp: (!) 97.5 F (36.4 C) 97.8 F (36.6 C) 98.2 F (36.8 C) 97.6 F (36.4 C)  TempSrc: Oral Oral Oral Oral  SpO2: (!) 84% 100% 100% 100%  Weight:      Height:        Intake/Output Summary (Last 24 hours) at 05/03/2023 1627 Last data filed at 05/03/2023 1335 Gross per 24 hour  Intake 760 ml  Output 750 ml  Net 10 ml     Wt Readings from Last 3 Encounters:  05/01/23 54 kg  03/25/23 56.7 kg  02/15/23 57.2 kg    Physical Exam General: Somewhat lethargic but easily arousable Cardiovascular: S1 S2 clear, RRR.  Respiratory: CTAB, no wheezing Gastrointestinal: Soft, nontender, nondistended, NBS Ext: no pedal edema bilaterally Neuro: no new deficits Psych: dementia    Data Reviewed:  I have personally reviewed following labs    CBC Lab Results   Component Value Date   WBC 6.6 05/03/2023   RBC 3.05 (L) 05/03/2023   HGB 10.0 (L) 05/03/2023   HCT 30.7 (L) 05/03/2023   MCV 100.7 (H) 05/03/2023   MCH 32.8 05/03/2023  PLT 313 05/03/2023   MCHC 32.6 05/03/2023   RDW 14.6 05/03/2023   LYMPHSABS 1.1 04/30/2023   MONOABS 0.8 04/30/2023   EOSABS 0.1 04/30/2023   BASOSABS 0.0 04/30/2023     Last metabolic panel Lab Results  Component Value Date   NA 134 (L) 05/03/2023   K 3.6 05/03/2023   CL 96 (L) 05/03/2023   CO2 27 05/03/2023   BUN 19 05/03/2023   CREATININE 0.68 05/03/2023   GLUCOSE 201 (H) 05/03/2023   GFRNONAA >60 05/03/2023   GFRAA 54 (L) 10/12/2019   CALCIUM 9.6 05/03/2023   PHOS 2.9 02/15/2023   PROT 6.9 04/30/2023   ALBUMIN 3.2 (L) 04/30/2023   BILITOT 0.3 04/30/2023   ALKPHOS 99 04/30/2023   AST 53 (H) 04/30/2023   ALT 55 (H) 04/30/2023   ANIONGAP 11 05/03/2023    CBG (last 3)  Recent Labs    05/03/23 0258 05/03/23 0746 05/03/23 1137  GLUCAP 185* 206* 93      Coagulation Profile: No results for input(s): "INR", "PROTIME" in the last 168 hours.   Radiology Studies: I have personally reviewed the imaging studies  No results found.     Thad Ranger M.D. [facility] 05/03/2023, 4:27 PM  Available via Epic secure chat 7am-7pm After 7 pm, please refer to night coverage provider listed on amion.

## 2023-05-03 NOTE — Progress Notes (Signed)
PT Cancellation Note  Patient Details Name: [name] [name] MRN: [id number] DOB: [birth year]   Cancelled Treatment:    Reason Eval/Treat Not Completed: Fatigue/lethargy limiting ability to participate. Attempted to wake pt with loud voice, but pt unable to stay awake long enough for conversation or to perform PT eval. Will continue to follow for PT eval.   Tori Mariah Harn PT, DPT 05/03/23, 12:06 PM

## 2023-05-03 NOTE — Progress Notes (Addendum)
Patient's daughter Cathlean Cower requested to check patient's BG more often than 4 times daily as MD ordered. Patient's BG at 2200 was 161. RN explained to her that her BG was not critical result and next time to check BG will be at 0800. RN offered to check patient's BG right now to ensure patient 's BG is ok but she did not agree and raised her voice through the phone and asked this RN had to change MD order right now and hung up her phone.   Patient's daughter called backed and asked to talk with Director of the unit. Information was given and again she wanted this RN to change MD order to check BG every 4 hours, RN explained that RN could not change MD orders and just could notify MD her concerns. She stated that she wanted to talk with higher level of leadership to ask them to do what she asked for.

## 2023-05-03 NOTE — Progress Notes (Signed)
PT Cancellation Note  Patient Details Name: [name] [name] MRN: [id number] DOB: 1933/07/22   Cancelled Treatment:    Reason Eval/Treat Not Completed: PT screened, no needs identified, will sign off. Pt awake and oldest daughter present in room. Daughter reports pt has all DME needs at home, her and pt's other daughter are assisting pt as needed. Daughter reports pt is no longer ambulatory and HHPT stopped due to it was wearing the pt out. No acute PT needs identified, will sign off at this time.    Tori Shilah Hefel PT, DPT 05/03/23, 1:11 PM

## 2023-05-03 NOTE — Plan of Care (Signed)
  Problem: Education: Goal: Knowledge of risk factors and measures for prevention of condition will improve Outcome: Progressing   Problem: Coping: Goal: Psychosocial and spiritual needs will be supported Outcome: Progressing   Problem: Respiratory: Goal: Will maintain a patent airway Outcome: Progressing Goal: Complications related to the disease process, condition or treatment will be avoided or minimized Outcome: Progressing   Problem: Education: Goal: Understanding of CV disease, CV risk reduction, and recovery process will improve Outcome: Progressing Goal: Individualized Educational Video(s) Outcome: Progressing   Problem: Activity: Goal: Ability to return to baseline activity level will improve Outcome: Progressing   Problem: Cardiovascular: Goal: Ability to achieve and maintain adequate cardiovascular perfusion will improve Outcome: Progressing Goal: Vascular access site(s) Level 0-1 will be maintained Outcome: Progressing   Problem: Health Behavior/Discharge Planning: Goal: Ability to safely manage health-related needs after discharge will improve Outcome: Progressing   Problem: Education: Goal: Ability to describe self-care measures that may prevent or decrease complications (Diabetes Survival Skills Education) will improve Outcome: Progressing Goal: Individualized Educational Video(s) Outcome: Progressing   Problem: Coping: Goal: Ability to adjust to condition or change in health will improve Outcome: Progressing   Problem: Fluid Volume: Goal: Ability to maintain a balanced intake and output will improve Outcome: Progressing   Problem: Health Behavior/Discharge Planning: Goal: Ability to identify and utilize available resources and services will improve Outcome: Progressing Goal: Ability to manage health-related needs will improve Outcome: Progressing   Problem: Metabolic: Goal: Ability to maintain appropriate glucose levels will improve Outcome:  Progressing   Problem: Nutritional: Goal: Maintenance of adequate nutrition will improve Outcome: Progressing Goal: Progress toward achieving an optimal weight will improve Outcome: Progressing   Problem: Skin Integrity: Goal: Risk for impaired skin integrity will decrease Outcome: Progressing   Problem: Tissue Perfusion: Goal: Adequacy of tissue perfusion will improve Outcome: Progressing   Problem: Education: Goal: Knowledge of General Education information will improve Description: Including pain rating scale, medication(s)/side effects and non-pharmacologic comfort measures Outcome: Progressing   Problem: Health Behavior/Discharge Planning: Goal: Ability to manage health-related needs will improve Outcome: Progressing   Problem: Clinical Measurements: Goal: Ability to maintain clinical measurements within normal limits will improve Outcome: Progressing Goal: Will remain free from infection Outcome: Progressing Goal: Diagnostic test results will improve Outcome: Progressing Goal: Respiratory complications will improve Outcome: Progressing Goal: Cardiovascular complication will be avoided Outcome: Progressing   Problem: Activity: Goal: Risk for activity intolerance will decrease Outcome: Progressing   Problem: Nutrition: Goal: Adequate nutrition will be maintained Outcome: Progressing   Problem: Coping: Goal: Level of anxiety will decrease Outcome: Progressing   Problem: Elimination: Goal: Will not experience complications related to bowel motility Outcome: Progressing Goal: Will not experience complications related to urinary retention Outcome: Progressing   Problem: Pain Managment: Goal: General experience of comfort will improve Outcome: Progressing   Problem: Safety: Goal: Ability to remain free from injury will improve Outcome: Progressing   Problem: Skin Integrity: Goal: Risk for impaired skin integrity will decrease Outcome: Progressing

## 2023-05-03 NOTE — Inpatient Diabetes Management (Addendum)
Inpatient Diabetes Program Recommendations  AACE/ADA: New Consensus Statement on Inpatient Glycemic Control (2015)  Target Ranges:  Prepandial:   less than 140 mg/dL      Peak postprandial:   less than 180 mg/dL (1-2 hours)      Critically ill patients:  140 - 180 mg/dL   Lab Results  Component Value Date   GLUCAP 206 (H) 05/03/2023   HGBA1C 8.8 (H) 02/26/2023    Review of Glycemic Control  Latest Reference Range & Units 05/02/23 13:35 05/02/23 17:36 05/02/23 21:46 05/03/23 02:58 05/03/23 07:46  Glucose-Capillary 70 - 99 mg/dL 308 (H) 81 657 (H) 846 (H) 206 (H)   Diabetes history: DM 2 Outpatient Diabetes medications:  Novolog 2-10 units tid with  meals Levemir 7 units in the AM and 3 units q PM Current orders for Inpatient glycemic control:  Semglee 7 units daily Novolog moderate tid with meals and HS  Inpatient Diabetes Program Recommendations:    May consider increasing Semglee to 10 units daily.  Also may consider reducing Novolog correction to sensitive tid with meals and add Novolog 3 units tid with meals (hold if patient eats less than 50% or NPO).    Addendum:  Spoke with patient's daughter at bedside. She states that her and her sister were administering insulin at home.  Usually they only gave her the morning dose of Levemir and not the evening dose due to blood sugar dropping.  She states that patient usually does not get up til around 12 noon and this is when she eats her breakfast.  This is when they give her Novolog (up to 10 units) and Levemir.  They try to make sure that patients blood sugar does not drop and so they are not as aggressive with insulin in the evening. No needs noted at this time.   Thanks,  Beryl Meager, RN, BC-ADM Inpatient Diabetes Coordinator Pager [phone]  (8a-5p)

## 2023-05-04 DIAGNOSIS — N3000 Acute cystitis without hematuria: Secondary | ICD-10-CM | POA: Diagnosis not present

## 2023-05-04 DIAGNOSIS — R55 Syncope and collapse: Secondary | ICD-10-CM | POA: Diagnosis not present

## 2023-05-04 DIAGNOSIS — G9341 Metabolic encephalopathy: Secondary | ICD-10-CM | POA: Diagnosis not present

## 2023-05-04 DIAGNOSIS — R400 Somnolence: Secondary | ICD-10-CM | POA: Diagnosis not present

## 2023-05-04 LAB — GLUCOSE, CAPILLARY
Glucose-Capillary: 153 mg/dL — ABNORMAL HIGH (ref 70–99)
Glucose-Capillary: 168 mg/dL — ABNORMAL HIGH (ref 70–99)
Glucose-Capillary: 226 mg/dL — ABNORMAL HIGH (ref 70–99)
Glucose-Capillary: 252 mg/dL — ABNORMAL HIGH (ref 70–99)
Glucose-Capillary: 162 mg/dL — ABNORMAL HIGH (ref 70–99)

## 2023-05-04 LAB — T3: T3, Total: 43 ng/dL — ABNORMAL LOW (ref 71–180)

## 2023-05-04 NOTE — Progress Notes (Signed)
PT Cancellation Note  Patient Details Name: [name] [name] MRN: [id number] DOB: 11-Oct-1933   Cancelled Treatment:    Reason Eval/Treat Not Completed: PT screened, no needs identified, will sign off. PT re-ordered. PT spoke with pt's daughter, daughter confirms pt is at baseline and no desire for acute or f/u PT services. Will sign off at this time.    Domenick Bookbinder PT, DPT 05/04/23, 1:09 PM

## 2023-05-04 NOTE — Progress Notes (Signed)
OT Cancellation Note  Patient Details Name: [name] [name] MRN: [id number] DOB: September 12, 1933   Cancelled Treatment:    Reason Eval/Treat Not Completed: OT screened, no needs identified, will sign off Patient is TD for ADLs and has family support at home. Patients family has declined need for OT services for the last three hospitalizations. Patient is at baseline at this time. OT to sign off.  Rosalio Loud, MS Acute Rehabilitation Department Office# [phone]  05/04/2023, 10:48 AM

## 2023-05-04 NOTE — Progress Notes (Signed)
[facility]                                                                              [name] [name], is a 89 y.o. female, DOB - 1934/06/01, KVQ:[id number] Admit date - 04/30/2023    Outpatient Primary MD for the patient is [name], MD  LOS - 2  days  Chief Complaint  Patient presents with   Syncope        Brief summary   Patient is a 89 year old female with dementia, IDDM, dermatomyositis on Plaquenil and chronic prednisone 5 mg daily, PAD, prior history of syncope due to orthostasis and vasovagal episodes, seizure, hypothyroidism, macrocytic anemia presented with altered mental status, near syncopal episode. At baseline, chronically bed/wheelchair bound due to history of dermatomyositis.  Per daughter, was recently treated for UTI with ?nitrofurantoin and completed antibiotics this past Monday on 04/26/2023.  On the day of admission, patient was having a BM and per family was straining and subsequently had a near syncopal episode with a vomiting episode afterward.  Since then she was noted to be more lethargic and less interactive.  In ED BP 169/59, pulse 60, RR 17, temp 97.9, O2 sats 100% on room air Creatinine 0.8, WBCs 11.8, hemoglobin 9.6 UA positive for UTI   Assessment & Plan    Principal Problem:   Acute metabolic encephalopathy in the setting of UTI and dementia -Patient has baseline dementia, presented with acute change in mentation most likely due to UTI -Check x-ray showed no active disease, -Alert and awake, responding to commands, daughter Cathlean Cower at the bedside, confirmed patient close to baseline.   Active Problems:   UTI (urinary tract infection) -Urine culture showed more than 100,000 colonies of Klebsiella, sensitive to IV Rocephin -Patient was on nitrofurantoin prior to admission, culture shows resistance -Continue IV Rocephin  Near syncope -Likely due to UTI, patient has a prior history of orthostasis and vasovagal episodes.  She  did not lose consciousness.  Had episode of emesis afterwards.  Likely vagal episode. - Continue midodrine    Insulin dependent type 2 diabetes mellitus (HCC), uncontrolled with hyperglycemia -Hemoglobin A1c 8.3 CBG (last 3)  Recent Labs    05/04/23 0436 05/04/23 0714 05/04/23 1205  GLUCAP 153* 168* 226*   -Continue Semglee 10 units daily, NovoLog 3 units 3 times daily AC, SSI   Hypokalemia -Replace prn    History of seizure -Continue Keppra    Hypertension with hx of orthostatic hypotension -Continue midodrine 2.5 mg 3 times daily    Hypothyroidism -Continue Synthroid 100 mcg daily, TSH 1.4, free T4 1.3    Dermatopolymyositis, unspecified, organ involvement unspecified (HCC) -Continue Plaquenil, prednisone 5 mg daily     Peripheral artery disease (HCC) -Status post angioplasty of bilateral common iliac artery stenosis in 01/2023 -Continue aspirin, Plavix, Zetia    Macrocytic anemia -Folic acid and > 40, B12 2144 -H&H at baseline  Chronic hyponatremia -Continue to follow, sodium currently at baseline  Estimated body mass index is 19.8 kg/m as calculated from the following:   Height as of this encounter: 5\' 5"  (1.651 m).   Weight as of this encounter:  54 kg.  Code Status: DNR DVT Prophylaxis:  enoxaparin (LOVENOX) injection 40 mg Start: 05/01/23 2200   Level of Care: Level of care: Med-Surg Family Communication: Updated patient's daughter Cathlean Cower at the bedside Disposition Plan:      Remains inpatient appropriate:   Per daughter, patient is bedbound and requested to resume home health when she is ready to be discharged.  She requested IV antibiotics course to be completed before discharge.   Procedures:  None  Consultants:   None  Antimicrobials:   Anti-infectives (From admission, onward)    Start     Dose/Rate Route Frequency Ordered Stop   05/01/23 2200  cefTRIAXone (ROCEPHIN) 1 g in sodium chloride 0.9 % 100 mL IVPB        1 g 200 mL/hr over 30  Minutes Intravenous Every 24 hours 05/01/23 0009     05/01/23 0015  hydroxychloroquine (PLAQUENIL) tablet 200 mg        200 mg Oral Daily at bedtime 05/01/23 0010     04/30/23 2315  cefTRIAXone (ROCEPHIN) 1 g in sodium chloride 0.9 % 100 mL IVPB        1 g 200 mL/hr over 30 Minutes Intravenous  Once 04/30/23 2311 05/01/23 0727          Medications  aspirin EC  81 mg Oral Daily   clopidogrel  75 mg Oral Q breakfast   enoxaparin (LOVENOX) injection  40 mg Subcutaneous Q24H   ezetimibe  10 mg Oral QHS   famotidine  20 mg Oral QHS   folic acid  1 mg Oral QHS   hydroxychloroquine  200 mg Oral QHS   insulin aspart  0-5 Units Subcutaneous QHS   insulin aspart  0-9 Units Subcutaneous TID WC   insulin aspart  3 Units Subcutaneous TID WC   insulin glargine-yfgn  10 Units Subcutaneous Daily   levETIRAcetam  500 mg Oral BID   levothyroxine  100 mcg Oral Q0600   midodrine  2.5 mg Oral TID WC   predniSONE  5 mg Oral Q breakfast   sodium chloride flush  3 mL Intravenous Q12H      Subjective:   [name] [name] was seen and examined today.  Today much more alert and awake, daughter at the bedside.  Patient feels rested and pleasant.  Follows commands.  No active issues no fevers or chills.  Per daughter, mental status appears close to baseline.   Objective:   Vitals:   05/03/23 1320 05/03/23 1947 05/04/23 0441 05/04/23 1206  BP: (!) 131/54 (!) 128/51 (!) 145/61 (!) 147/68  Pulse: (!) 57 62 63 87  Resp: 15 18 16 20   Temp: 97.6 F (36.4 C) 98.3 F (36.8 C) 98.1 F (36.7 C) 98 F (36.7 C)  TempSrc: Oral Oral    SpO2: 100% 100% 99% 100%  Weight:      Height:        Intake/Output Summary (Last 24 hours) at 05/04/2023 1407 Last data filed at 05/04/2023 0200 Gross per 24 hour  Intake 240 ml  Output 1000 ml  Net -760 ml     Wt Readings from Last 3 Encounters:  05/01/23 54 kg  03/25/23 56.7 kg  02/15/23 57.2 kg  Physical Exam General: Alert and oriented to self, recognizes  daughter, NAD, pleasant and smiling Cardiovascular: S1 S2 clear, RRR.  Respiratory: CTAB, no wheezing Gastrointestinal: Soft, nontender, nondistended, NBS Ext: no pedal edema bilaterally Neuro: no new deficits Psych: dementia but alert and awake, following simple  commands, recognizes her daughter    Data Reviewed:  I have personally reviewed following labs    CBC Lab Results  Component Value Date   WBC 6.6 05/03/2023   RBC 3.05 (L) 05/03/2023   HGB 10.0 (L) 05/03/2023   HCT 30.7 (L) 05/03/2023   MCV 100.7 (H) 05/03/2023   MCH 32.8 05/03/2023   PLT 313 05/03/2023   MCHC 32.6 05/03/2023   RDW 14.6 05/03/2023   LYMPHSABS 1.1 04/30/2023   MONOABS 0.8 04/30/2023   EOSABS 0.1 04/30/2023   BASOSABS 0.0 04/30/2023     Last metabolic panel Lab Results  Component Value Date   NA 134 (L) 05/03/2023   K 3.6 05/03/2023   CL 96 (L) 05/03/2023   CO2 27 05/03/2023   BUN 19 05/03/2023   CREATININE 0.68 05/03/2023   GLUCOSE 201 (H) 05/03/2023   GFRNONAA >60 05/03/2023   GFRAA 54 (L) 10/12/2019   CALCIUM 9.6 05/03/2023   PHOS 2.9 02/15/2023   PROT 6.9 04/30/2023   ALBUMIN 3.2 (L) 04/30/2023   BILITOT 0.3 04/30/2023   ALKPHOS 99 04/30/2023   AST 53 (H) 04/30/2023   ALT 55 (H) 04/30/2023   ANIONGAP 11 05/03/2023    CBG (last 3)  Recent Labs    05/04/23 0436 05/04/23 0714 05/04/23 1205  GLUCAP 153* 168* 226*      Coagulation Profile: No results for input(s): "INR", "PROTIME" in the last 168 hours.   Radiology Studies: I have personally reviewed the imaging studies  No results found.     Thad Ranger M.D. [facility] 05/04/2023, 2:07 PM  Available via Epic secure chat 7am-7pm After 7 pm, please refer to night coverage provider listed on amion.

## 2023-05-04 NOTE — Plan of Care (Signed)
  Problem: Education: Goal: Knowledge of risk factors and measures for prevention of condition will improve Outcome: Progressing   Problem: Coping: Goal: Psychosocial and spiritual needs will be supported Outcome: Progressing   Problem: Respiratory: Goal: Will maintain a patent airway Outcome: Progressing Goal: Complications related to the disease process, condition or treatment will be avoided or minimized Outcome: Progressing   Problem: Education: Goal: Understanding of CV disease, CV risk reduction, and recovery process will improve Outcome: Progressing Goal: Individualized Educational Video(s) Outcome: Progressing   Problem: Activity: Goal: Ability to return to baseline activity level will improve Outcome: Progressing   Problem: Cardiovascular: Goal: Ability to achieve and maintain adequate cardiovascular perfusion will improve Outcome: Progressing Goal: Vascular access site(s) Level 0-1 will be maintained Outcome: Progressing   Problem: Health Behavior/Discharge Planning: Goal: Ability to safely manage health-related needs after discharge will improve Outcome: Progressing   Problem: Education: Goal: Ability to describe self-care measures that may prevent or decrease complications (Diabetes Survival Skills Education) will improve Outcome: Progressing Goal: Individualized Educational Video(s) Outcome: Progressing   Problem: Coping: Goal: Ability to adjust to condition or change in health will improve Outcome: Progressing   Problem: Fluid Volume: Goal: Ability to maintain a balanced intake and output will improve Outcome: Progressing   Problem: Health Behavior/Discharge Planning: Goal: Ability to identify and utilize available resources and services will improve Outcome: Progressing Goal: Ability to manage health-related needs will improve Outcome: Progressing   Problem: Metabolic: Goal: Ability to maintain appropriate glucose levels will improve Outcome:  Progressing   Problem: Nutritional: Goal: Maintenance of adequate nutrition will improve Outcome: Progressing Goal: Progress toward achieving an optimal weight will improve Outcome: Progressing   Problem: Skin Integrity: Goal: Risk for impaired skin integrity will decrease Outcome: Progressing   Problem: Tissue Perfusion: Goal: Adequacy of tissue perfusion will improve Outcome: Progressing   Problem: Education: Goal: Knowledge of General Education information will improve Description: Including pain rating scale, medication(s)/side effects and non-pharmacologic comfort measures Outcome: Progressing   Problem: Health Behavior/Discharge Planning: Goal: Ability to manage health-related needs will improve Outcome: Progressing   Problem: Clinical Measurements: Goal: Ability to maintain clinical measurements within normal limits will improve Outcome: Progressing Goal: Will remain free from infection Outcome: Progressing Goal: Diagnostic test results will improve Outcome: Progressing Goal: Respiratory complications will improve Outcome: Progressing Goal: Cardiovascular complication will be avoided Outcome: Progressing   Problem: Activity: Goal: Risk for activity intolerance will decrease Outcome: Progressing   Problem: Nutrition: Goal: Adequate nutrition will be maintained Outcome: Progressing   Problem: Coping: Goal: Level of anxiety will decrease Outcome: Progressing   Problem: Elimination: Goal: Will not experience complications related to bowel motility Outcome: Progressing Goal: Will not experience complications related to urinary retention Outcome: Progressing   Problem: Pain Managment: Goal: General experience of comfort will improve Outcome: Progressing   Problem: Safety: Goal: Ability to remain free from injury will improve Outcome: Progressing   Problem: Skin Integrity: Goal: Risk for impaired skin integrity will decrease Outcome: Progressing

## 2023-05-05 DIAGNOSIS — G9341 Metabolic encephalopathy: Secondary | ICD-10-CM | POA: Diagnosis not present

## 2023-05-05 DIAGNOSIS — R55 Syncope and collapse: Secondary | ICD-10-CM | POA: Diagnosis not present

## 2023-05-05 DIAGNOSIS — N3000 Acute cystitis without hematuria: Secondary | ICD-10-CM | POA: Diagnosis not present

## 2023-05-05 DIAGNOSIS — R400 Somnolence: Secondary | ICD-10-CM | POA: Diagnosis not present

## 2023-05-05 LAB — CBC
HCT: 27.3 % — ABNORMAL LOW (ref 36.0–46.0)
Hemoglobin: 9 g/dL — ABNORMAL LOW (ref 12.0–15.0)
MCH: 33.2 pg (ref 26.0–34.0)
MCHC: 33 g/dL (ref 30.0–36.0)
MCV: 100.7 fL — ABNORMAL HIGH (ref 80.0–100.0)
Platelets: 254 10*3/uL (ref 150–400)
RBC: 2.71 MIL/uL — ABNORMAL LOW (ref 3.87–5.11)
RDW: 14.7 % (ref 11.5–15.5)
WBC: 6.7 10*3/uL (ref 4.0–10.5)
nRBC: 0 % (ref 0.0–0.2)

## 2023-05-05 LAB — BASIC METABOLIC PANEL
Anion gap: 13 (ref 5–15)
BUN: 18 mg/dL (ref 8–23)
CO2: 26 mmol/L (ref 22–32)
Calcium: 9.4 mg/dL (ref 8.9–10.3)
Chloride: 100 mmol/L (ref 98–111)
Creatinine, Ser: 0.65 mg/dL (ref 0.44–1.00)
GFR, Estimated: >60 mL/min (ref >60)
Glucose, Bld: 175 mg/dL — ABNORMAL HIGH (ref 70–99)
Potassium: 3.5 mmol/L (ref 3.5–5.1)
Sodium: 139 mmol/L (ref 135–145)

## 2023-05-05 LAB — GLUCOSE, CAPILLARY
Glucose-Capillary: 71 mg/dL (ref 70–99)
Glucose-Capillary: 187 mg/dL — ABNORMAL HIGH (ref 70–99)
Glucose-Capillary: 146 mg/dL — ABNORMAL HIGH (ref 70–99)

## 2023-05-05 MED ORDER — MIDODRINE HCL 2.5 MG PO TABS: 2.5000 mg | ORAL_TABLET | Freq: Three times a day (TID) | ORAL | 0 refills | Status: DC

## 2023-05-05 MED ORDER — SODIUM CHLORIDE 0.9 % IV SOLN
1.0000 g | Freq: Once | INTRAVENOUS | Status: AC
  Administered 2023-05-05: 1 g via INTRAVENOUS
  Filled 2023-05-05: qty 10

## 2023-05-05 NOTE — Inpatient Diabetes Management (Signed)
Inpatient Diabetes Program Recommendations  AACE/ADA: New Consensus Statement on Inpatient Glycemic Control (2015)  Target Ranges:  Prepandial:   less than 140 mg/dL      Peak postprandial:   less than 180 mg/dL (1-2 hours)      Critically ill patients:  140 - 180 mg/dL   Lab Results  Component Value Date   GLUCAP 187 (H) 05/05/2023   HGBA1C 8.3 (H) 05/03/2023    Review of Glycemic Control  Diabetes history: DM2 Outpatient Diabetes medications: Levemir 7 in am and 3 QPM, Novolog 2-10 TID Current orders for Inpatient glycemic control: Semglee 10 every day, Novolog 0-9 TID and 0-5 HS + 3 TID  Post-prandials elevated May benefit from increase in Novolog meal coverage  Inpatient Diabetes Program Recommendations:    Consider increasing Novolog to 5 units TID with meals if eating > 50%  Continue to follow.  Thank you. Ailene Ards, RD, LDN, CDCES Inpatient Diabetes Coordinator [phone]

## 2023-05-05 NOTE — TOC Progression Note (Signed)
Transition of Care [facility]) - Progression Note    Patient Details  Name: [name] [name] MRN: [id number] Date of Birth: 12-13-1933  Transition of Care [facility]) CM/SW Contact  Otelia Santee, LCSW Phone Number: 05/05/2023, 12:59 PM  Clinical Narrative:    CSW contacted by [facility] who share that this pt is active with their services for [facility] RN, PT, OT. ROC orders to be placed prior to discharge.    Expected Discharge Plan: Home w Home Health Services Barriers to Discharge: Continued Medical Work up  Expected Discharge Plan and Services In-house Referral: NA Discharge Planning Services: NA Post Acute Care Choice: Resumption of Svcs/PTA Provider Living arrangements for the past 2 months: Single Family Home                 DME Arranged: N/A DME Agency: NA                   Social Determinants of Health (SDOH) Interventions SDOH Screenings   Food Insecurity: No Food Insecurity (05/01/2023)  Housing: Low Risk  (05/01/2023)  Transportation Needs: No Transportation Needs (05/01/2023)  Utilities: Not At Risk (05/01/2023)  Social Connections: Unknown (12/02/2021)   Received [facility]  Tobacco Use: Medium Risk (05/01/2023)    Readmission Risk Interventions    03/17/2023    2:50 PM 02/16/2023   11:04 AM 10/04/2022   11:17 AM  Readmission Risk Prevention Plan  Transportation Screening Complete Complete Complete  PCP or Specialist Appt within 3-5 Days   Complete  HRI or Home Care Consult   Complete  Social Work Consult for Recovery Care Planning/Counseling   Complete  Palliative Care Screening   Not Applicable  Medication Review [employer]) Complete  Complete  PCP or Specialist appointment within 3-5 days of discharge Complete    HRI or Home Care Consult Complete Complete   SW Recovery Care/Counseling Consult Complete Complete   Palliative Care Screening Not Applicable    Skilled Nursing Facility Patient Refused

## 2023-05-05 NOTE — Plan of Care (Signed)
Problem: Clinical Measurements: Goal: Ability to maintain clinical measurements within normal limits will improve Outcome: Progressing Goal: Diagnostic test results will improve Outcome: Progressing Goal: Respiratory complications will improve Outcome: Progressing Goal: Cardiovascular complication will be avoided Outcome: Progressing

## 2023-05-05 NOTE — Discharge Summary (Signed)
Physician Discharge Summary   Patient: [name] [name] MRN: [id number] DOB: 1934/05/22  Admit date:     04/30/2023  Discharge date: 05/05/23  Discharge Physician: Rickey Barbara   PCP: [name], MD   Recommendations at discharge:    Follow up with PCP in 1-2 weeks  Discharge Diagnoses: Principal Problem:   Acute metabolic encephalopathy Active Problems:   UTI (urinary tract infection)   Insulin dependent type 2 diabetes mellitus (HCC)   History of seizure   Hypertension with hx of orthostatic hypotension   Hypothyroidism   Dermatopolymyositis, unspecified, organ involvement unspecified (HCC)   Dementia without behavioral disturbance (HCC)   Peripheral artery disease (HCC)   Syncope   Macrocytic anemia  Resolved Problems:   * No resolved hospital problems. *  Hospital Course: [name] [name] is a 89 y.o. female with medical history significant for dementia, insulin-dependent T2DM, dermatomyositis (on Plaquenil and chronic pred 5 mg daily), PAD, history of syncope due to orthostasis and vasovagal episodes, history of seizure, hypothyroidism, macrocytic anemia who is admitted for evaluation of acute metabolic encephalopathy, treated for UTI  Assessment and Plan: Principal Problem:   Acute metabolic encephalopathy in the setting of UTI and dementia -Patient has baseline dementia, presented with acute change in mentation most likely due to UTI -Check x-ray showed no active disease, -Pt now alert and awake, conversing    Active Problems:   UTI (urinary tract infection) -Urine culture showed more than 100,000 colonies of Klebsiella, sensitive to IV Rocephin -Patient was on nitrofurantoin prior to admission, culture shows resistance -completed 6 day course of IV Rocephin   Near syncope -Likely due to UTI, patient has a prior history of orthostasis and vasovagal episodes.  She did not lose consciousness.  Had episode of emesis afterwards.  Likely vagal episode. - Continue  midodrine, weaning as tolerated     Insulin dependent type 2 diabetes mellitus (HCC), uncontrolled with hyperglycemia -Hemoglobin A1c 8.3  -Continued Semglee 10 units daily, NovoLog 3 units 3 times daily AC, SSI while in hospital   Hypokalemia -Replace prn     History of seizure -Continue Keppra     Hypertension with hx of orthostatic hypotension -Continue midodrine 2.5 mg 3 times daily, wean as tolerated     Hypothyroidism -Continue Synthroid 100 mcg daily, TSH 1.4, free T4 1.3     Dermatopolymyositis, unspecified, organ involvement unspecified (HCC) -Continue Plaquenil, prednisone 5 mg daily       Peripheral artery disease (HCC) -Status post angioplasty of bilateral common iliac artery stenosis in 01/2023 -Continue aspirin, Plavix, Zetia     Macrocytic anemia -Folic acid and > 40, B12 2144 -H&H at baseline   Chronic hyponatremia -sodium currently at baseline       Consultants:  Procedures performed:   Disposition: Home Diet recommendation:  Carb modified diet DISCHARGE MEDICATION: Allergies as of 05/05/2023       Reactions   Nsaids Other (See Comments)   No NSAIDS due to kidney function- ESPECIALLY ibuprofen or Aleve   Biaxin [clarithromycin] Hives   Statins Other (See Comments)   Muscle pain (myositis per daugher)   Bactrim [sulfamethoxazole-trimethoprim] Hives, Rash   Lisinopril Cough   Sulfa Antibiotics Hives, Rash, Other (See Comments)   NO sulfa-based meds!!        Medication List     TAKE these medications    acetaminophen 650 MG CR tablet Commonly known as: TYLENOL Take 650 mg by mouth every 8 (eight) hours as needed for pain.  Bayer Low Dose 81 MG tablet Generic drug: aspirin EC Take 81 mg by mouth in the morning. Swallow whole.   cetirizine 10 MG tablet Commonly known as: ZYRTEC Take 10 mg by mouth 3 (three) times daily as needed for allergies or rhinitis.   clopidogrel 75 MG tablet Commonly known as: PLAVIX Take 1 tablet (75 mg  total) by mouth daily with breakfast.   CRANBERRY PO Take 1 tablet by mouth daily.   ezetimibe 10 MG tablet Commonly known as: Zetia Take 1 tablet (10 mg total) by mouth daily. What changed: when to take this   famotidine 20 MG tablet Commonly known as: PEPCID Take 20 mg by mouth at bedtime.   ferrous sulfate 220 (44 Fe) MG/5ML solution Take 330 mg by mouth 3 (three) times a week.   fluconazole 100 MG tablet Commonly known as: DIFLUCAN Take 100 mg by mouth 3 (three) times a week.   folic acid 1 MG tablet Commonly known as: FOLVITE Take 1 mg by mouth at bedtime.   Levemir FlexTouch 100 UNIT/ML FlexTouch Pen Generic drug: insulin detemir Inject 3-7 Units into the skin See admin instructions. Inject 7 units into the skin in the morning and 3 units at bedtime PRN   levETIRAcetam 500 MG tablet Commonly known as: KEPPRA Take 500 mg by mouth in the morning and at bedtime.   liver oil-zinc oxide 40 % ointment Commonly known as: DESITIN Apply topically 3 (three) times daily.   loratadine 10 MG tablet Commonly known as: CLARITIN Take 10 mg by mouth daily.   methotrexate 2.5 MG tablet Commonly known as: RHEUMATREX Take 1 tablet (2.5 mg total) by mouth once a week. Caution:Chemotherapy. Protect from light. What changed:  how much to take when to take this   metoprolol tartrate 25 MG tablet Commonly known as: LOPRESSOR Take 1 tablet (25 mg total) by mouth 2 (two) times daily. What changed:  how much to take when to take this reasons to take this   midodrine 2.5 MG tablet Commonly known as: PROAMATINE Take 1 tablet (2.5 mg total) by mouth 3 (three) times daily with meals. What changed: medication strength   multivitamin with minerals Tabs tablet Take 1 tablet by mouth daily with breakfast.   NovoLOG FlexPen 100 UNIT/ML FlexPen Generic drug: insulin aspart Inject 2-10 Units into the skin See admin instructions. Inject 2-10 units into the skin three times a day with  meals, per sliding scale:  Breakfast: BGL 80-199 = 8 units; 200-299 = 9 units; 300 or greater = 10 units Lunch: BGL 80-199 = 5 units; 200-299 = 6 units; 300 or greater = 7 units Supper/evening meal: BGL 80-199 = 2 units; 200-299 = 3 units; 300 or greater = 4 units   Plaquenil 200 MG tablet Generic drug: hydroxychloroquine Take 200 mg by mouth at bedtime.   predniSONE 5 MG tablet Commonly known as: DELTASONE Take 5 mg by mouth daily with breakfast.   Synthroid 100 MCG tablet Generic drug: levothyroxine Take 100 mcg by mouth daily before breakfast.   triamcinolone ointment 0.1 % Commonly known as: KENALOG Apply 1 Application topically 2 (two) times daily as needed (skin irritation- affected areas).   VITAMIN D-3 PO Take 2,000 Units by mouth daily.        Follow-up Information     [name], MD Follow up in 2 week(s).   Specialty: Internal Medicine Why: Hospital follow up Contact information: [address] [place] [address] [phone]  Discharge Exam: Filed Weights   05/01/23 1044 05/01/23 1415  Weight: 56.7 kg 54 kg   General exam: Awake, laying in bed, in nad Respiratory system: Normal respiratory effort, no wheezing Cardiovascular system: regular rate, s1, s2 Gastrointestinal system: Soft, nondistended, positive BS Central nervous system: CN2-12 grossly intact, strength intact Extremities: Perfused, no clubbing Skin: Normal skin turgor, no notable skin lesions seen Psychiatry: Mood normal // no visual hallucinations   Condition at discharge: fair  The results of significant diagnostics from this hospitalization (including imaging, microbiology, ancillary and laboratory) are listed below for reference.   Imaging Studies: DG CHEST PORT 1 VIEW  Result Date: 05/01/2023 CLINICAL DATA:  Acute encephalopathy, failure to thrive. EXAM: PORTABLE CHEST 1 VIEW COMPARISON:  03/16/2023. FINDINGS: The heart size and  mediastinal contours are within normal limits. There is atherosclerotic calcification of the aorta. No consolidation, effusion, or pneumothorax. A loop recorder device is present over the left chest. No acute osseous abnormality. IMPRESSION: No active disease. Electronically Signed   By: Thornell Sartorius M.D.   On: 05/01/2023 02:41    Microbiology: Results for orders placed or performed during the hospital encounter of 04/30/23  Urine Culture     Status: Abnormal   Collection Time: 04/30/23 12:33 AM   Specimen: Urine, Clean Catch  Result Value Ref Range Status   Specimen Description   Final    URINE, CLEAN CATCH Performed at [facility], [address] [place], [place] [address]    Special Requests   Final    NONE Performed at [facility], [address], [place], [place] [address]    Culture >=100,000 COLONIES/mL KLEBSIELLA PNEUMONIAE (A)  Final   Report Status 05/03/2023 FINAL  Final   Organism ID, Bacteria KLEBSIELLA PNEUMONIAE (A)  Final      Susceptibility   Klebsiella pneumoniae - MIC*    AMPICILLIN RESISTANT Resistant     CEFAZOLIN <=4 SENSITIVE Sensitive     CEFEPIME <=0.12 SENSITIVE Sensitive     CEFTRIAXONE <=0.25 SENSITIVE Sensitive     CIPROFLOXACIN 0.5 INTERMEDIATE Intermediate     GENTAMICIN <=1 SENSITIVE Sensitive     IMIPENEM <=0.25 SENSITIVE Sensitive     NITROFURANTOIN 256 RESISTANT Resistant     TRIMETH/SULFA <=20 SENSITIVE Sensitive     AMPICILLIN/SULBACTAM 8 SENSITIVE Sensitive     PIP/TAZO 8 SENSITIVE Sensitive ug/mL    * >=100,000 COLONIES/mL KLEBSIELLA PNEUMONIAE    Labs: CBC: Recent Labs  Lab 04/30/23 2146 05/01/23 0517 05/02/23 0529 05/03/23 0520 05/05/23 0525  WBC 11.8* 8.8 7.6 6.6 6.7  NEUTROABS 9.7*  --   --   --   --   HGB 9.6* 9.1* 9.6* 10.0* 9.0*  HCT 29.2* 27.4* 29.9* 30.7* 27.3*  MCV 101.4* 100.4* 101.7* 100.7* 100.7*  PLT 279 280 292 313 254   Basic Metabolic Panel: Recent Labs  Lab  04/30/23 2146 05/01/23 0517 05/02/23 0529 05/03/23 0520 05/05/23 0525  NA 133* 134* 133* 134* 139  K 3.6 3.3* 4.0 3.6 3.5  CL 94* 94* 92* 96* 100  CO2 31 29 28 27 26   GLUCOSE 99 91 469* 201* 175*  BUN 20 17 20 19 18   CREATININE 0.82 0.71 0.80 0.68 0.65  CALCIUM 9.6 9.5 9.5 9.6 9.4   Liver Function Tests: Recent Labs  Lab 04/30/23 2146  AST 53*  ALT 55*  ALKPHOS 99  BILITOT 0.3  PROT 6.9  ALBUMIN 3.2*   CBG: Recent Labs  Lab 05/04/23 1650 05/04/23 2152  05/05/23 0258 05/05/23 0734 05/05/23 1227  GLUCAP 252* 162* 71 187* 146*    Discharge time spent: less than 30 minutes.  Signed: Rickey Barbara, MD [facility] 05/05/2023

## 2023-05-06 ENCOUNTER — Encounter (HOSPITAL_BASED_OUTPATIENT_CLINIC_OR_DEPARTMENT_OTHER): Payer: Medicare HMO | Admitting: Internal Medicine

## 2023-05-06 DIAGNOSIS — E1151 Type 2 diabetes mellitus with diabetic peripheral angiopathy without gangrene: Secondary | ICD-10-CM | POA: Insufficient documentation

## 2023-05-06 DIAGNOSIS — E11621 Type 2 diabetes mellitus with foot ulcer: Secondary | ICD-10-CM | POA: Insufficient documentation

## 2023-05-06 DIAGNOSIS — G8929 Other chronic pain: Secondary | ICD-10-CM | POA: Insufficient documentation

## 2023-05-06 DIAGNOSIS — L97518 Non-pressure chronic ulcer of other part of right foot with other specified severity: Secondary | ICD-10-CM | POA: Insufficient documentation

## 2023-05-06 DIAGNOSIS — F0390 Unspecified dementia without behavioral disturbance: Secondary | ICD-10-CM | POA: Insufficient documentation

## 2023-05-06 DIAGNOSIS — G40909 Epilepsy, unspecified, not intractable, without status epilepticus: Secondary | ICD-10-CM | POA: Insufficient documentation

## 2023-05-06 DIAGNOSIS — L97528 Non-pressure chronic ulcer of other part of left foot with other specified severity: Secondary | ICD-10-CM | POA: Insufficient documentation

## 2023-05-06 DIAGNOSIS — N182 Chronic kidney disease, stage 2 (mild): Secondary | ICD-10-CM | POA: Insufficient documentation

## 2023-05-06 DIAGNOSIS — I129 Hypertensive chronic kidney disease with stage 1 through stage 4 chronic kidney disease, or unspecified chronic kidney disease: Secondary | ICD-10-CM | POA: Insufficient documentation

## 2023-05-06 DIAGNOSIS — E1122 Type 2 diabetes mellitus with diabetic chronic kidney disease: Secondary | ICD-10-CM | POA: Insufficient documentation

## 2023-05-06 DIAGNOSIS — Z794 Long term (current) use of insulin: Secondary | ICD-10-CM | POA: Insufficient documentation

## 2023-05-11 DIAGNOSIS — E440 Moderate protein-calorie malnutrition: Secondary | ICD-10-CM | POA: Diagnosis not present

## 2023-05-11 DIAGNOSIS — J189 Pneumonia, unspecified organism: Secondary | ICD-10-CM | POA: Diagnosis not present

## 2023-05-11 DIAGNOSIS — Z7902 Long term (current) use of antithrombotics/antiplatelets: Secondary | ICD-10-CM | POA: Diagnosis not present

## 2023-05-11 DIAGNOSIS — E1151 Type 2 diabetes mellitus with diabetic peripheral angiopathy without gangrene: Secondary | ICD-10-CM | POA: Diagnosis not present

## 2023-05-11 DIAGNOSIS — M25511 Pain in right shoulder: Secondary | ICD-10-CM | POA: Diagnosis not present

## 2023-05-11 DIAGNOSIS — M810 Age-related osteoporosis without current pathological fracture: Secondary | ICD-10-CM | POA: Diagnosis not present

## 2023-05-11 DIAGNOSIS — M25551 Pain in right hip: Secondary | ICD-10-CM | POA: Diagnosis not present

## 2023-05-11 DIAGNOSIS — E1165 Type 2 diabetes mellitus with hyperglycemia: Secondary | ICD-10-CM | POA: Diagnosis not present

## 2023-05-11 DIAGNOSIS — L97528 Non-pressure chronic ulcer of other part of left foot with other specified severity: Secondary | ICD-10-CM | POA: Diagnosis not present

## 2023-05-11 DIAGNOSIS — G4700 Insomnia, unspecified: Secondary | ICD-10-CM | POA: Diagnosis not present

## 2023-05-11 DIAGNOSIS — E1122 Type 2 diabetes mellitus with diabetic chronic kidney disease: Secondary | ICD-10-CM | POA: Diagnosis not present

## 2023-05-11 DIAGNOSIS — N182 Chronic kidney disease, stage 2 (mild): Secondary | ICD-10-CM | POA: Diagnosis not present

## 2023-05-11 DIAGNOSIS — D539 Nutritional anemia, unspecified: Secondary | ICD-10-CM | POA: Diagnosis not present

## 2023-05-11 DIAGNOSIS — K5900 Constipation, unspecified: Secondary | ICD-10-CM | POA: Diagnosis not present

## 2023-05-11 DIAGNOSIS — M3392 Dermatopolymyositis, unspecified with myopathy: Secondary | ICD-10-CM | POA: Diagnosis not present

## 2023-05-11 DIAGNOSIS — I10 Essential (primary) hypertension: Secondary | ICD-10-CM | POA: Diagnosis not present

## 2023-05-11 DIAGNOSIS — G8220 Paraplegia, unspecified: Secondary | ICD-10-CM | POA: Diagnosis not present

## 2023-05-11 DIAGNOSIS — F02818 Dementia in other diseases classified elsewhere, unspecified severity, with other behavioral disturbance: Secondary | ICD-10-CM | POA: Diagnosis not present

## 2023-05-11 DIAGNOSIS — E11621 Type 2 diabetes mellitus with foot ulcer: Secondary | ICD-10-CM | POA: Diagnosis not present

## 2023-05-11 DIAGNOSIS — Z7982 Long term (current) use of aspirin: Secondary | ICD-10-CM | POA: Diagnosis not present

## 2023-05-11 DIAGNOSIS — L97518 Non-pressure chronic ulcer of other part of right foot with other specified severity: Secondary | ICD-10-CM | POA: Diagnosis not present

## 2023-05-11 DIAGNOSIS — G9341 Metabolic encephalopathy: Secondary | ICD-10-CM | POA: Diagnosis not present

## 2023-05-11 DIAGNOSIS — Z794 Long term (current) use of insulin: Secondary | ICD-10-CM | POA: Diagnosis not present

## 2023-05-11 DIAGNOSIS — E039 Hypothyroidism, unspecified: Secondary | ICD-10-CM | POA: Diagnosis not present

## 2023-05-11 DIAGNOSIS — D631 Anemia in chronic kidney disease: Secondary | ICD-10-CM | POA: Diagnosis not present

## 2023-05-12 DIAGNOSIS — J189 Pneumonia, unspecified organism: Secondary | ICD-10-CM | POA: Diagnosis not present

## 2023-05-12 DIAGNOSIS — G8220 Paraplegia, unspecified: Secondary | ICD-10-CM | POA: Diagnosis not present

## 2023-05-12 DIAGNOSIS — M810 Age-related osteoporosis without current pathological fracture: Secondary | ICD-10-CM | POA: Diagnosis not present

## 2023-05-12 DIAGNOSIS — K5900 Constipation, unspecified: Secondary | ICD-10-CM | POA: Diagnosis not present

## 2023-05-12 DIAGNOSIS — M3392 Dermatopolymyositis, unspecified with myopathy: Secondary | ICD-10-CM | POA: Diagnosis not present

## 2023-05-12 DIAGNOSIS — I10 Essential (primary) hypertension: Secondary | ICD-10-CM | POA: Diagnosis not present

## 2023-05-12 DIAGNOSIS — E039 Hypothyroidism, unspecified: Secondary | ICD-10-CM | POA: Diagnosis not present

## 2023-05-12 DIAGNOSIS — E11621 Type 2 diabetes mellitus with foot ulcer: Secondary | ICD-10-CM | POA: Diagnosis not present

## 2023-05-12 DIAGNOSIS — M25551 Pain in right hip: Secondary | ICD-10-CM | POA: Diagnosis not present

## 2023-05-12 DIAGNOSIS — M25511 Pain in right shoulder: Secondary | ICD-10-CM | POA: Diagnosis not present

## 2023-05-12 DIAGNOSIS — E1165 Type 2 diabetes mellitus with hyperglycemia: Secondary | ICD-10-CM | POA: Diagnosis not present

## 2023-05-12 DIAGNOSIS — F02818 Dementia in other diseases classified elsewhere, unspecified severity, with other behavioral disturbance: Secondary | ICD-10-CM | POA: Diagnosis not present

## 2023-05-12 DIAGNOSIS — E1151 Type 2 diabetes mellitus with diabetic peripheral angiopathy without gangrene: Secondary | ICD-10-CM | POA: Diagnosis not present

## 2023-05-12 DIAGNOSIS — Z794 Long term (current) use of insulin: Secondary | ICD-10-CM | POA: Diagnosis not present

## 2023-05-12 DIAGNOSIS — E440 Moderate protein-calorie malnutrition: Secondary | ICD-10-CM | POA: Diagnosis not present

## 2023-05-12 DIAGNOSIS — G9341 Metabolic encephalopathy: Secondary | ICD-10-CM | POA: Diagnosis not present

## 2023-05-12 DIAGNOSIS — L97528 Non-pressure chronic ulcer of other part of left foot with other specified severity: Secondary | ICD-10-CM | POA: Diagnosis not present

## 2023-05-12 DIAGNOSIS — D631 Anemia in chronic kidney disease: Secondary | ICD-10-CM | POA: Diagnosis not present

## 2023-05-12 DIAGNOSIS — D539 Nutritional anemia, unspecified: Secondary | ICD-10-CM | POA: Diagnosis not present

## 2023-05-12 DIAGNOSIS — Z7982 Long term (current) use of aspirin: Secondary | ICD-10-CM | POA: Diagnosis not present

## 2023-05-12 DIAGNOSIS — N182 Chronic kidney disease, stage 2 (mild): Secondary | ICD-10-CM | POA: Diagnosis not present

## 2023-05-12 DIAGNOSIS — Z7902 Long term (current) use of antithrombotics/antiplatelets: Secondary | ICD-10-CM | POA: Diagnosis not present

## 2023-05-12 DIAGNOSIS — L97518 Non-pressure chronic ulcer of other part of right foot with other specified severity: Secondary | ICD-10-CM | POA: Diagnosis not present

## 2023-05-12 DIAGNOSIS — E1122 Type 2 diabetes mellitus with diabetic chronic kidney disease: Secondary | ICD-10-CM | POA: Diagnosis not present

## 2023-05-12 DIAGNOSIS — G4700 Insomnia, unspecified: Secondary | ICD-10-CM | POA: Diagnosis not present

## 2023-05-12 NOTE — Plan of Care (Signed)
[facility] Tonsillectomy/Adenoidectomy, Postoperative PEDS care plan entered in error.

## 2023-05-13 DIAGNOSIS — Z7902 Long term (current) use of antithrombotics/antiplatelets: Secondary | ICD-10-CM | POA: Diagnosis not present

## 2023-05-13 DIAGNOSIS — L97518 Non-pressure chronic ulcer of other part of right foot with other specified severity: Secondary | ICD-10-CM | POA: Diagnosis not present

## 2023-05-13 DIAGNOSIS — E1165 Type 2 diabetes mellitus with hyperglycemia: Secondary | ICD-10-CM | POA: Diagnosis not present

## 2023-05-13 DIAGNOSIS — Z7982 Long term (current) use of aspirin: Secondary | ICD-10-CM | POA: Diagnosis not present

## 2023-05-13 DIAGNOSIS — E440 Moderate protein-calorie malnutrition: Secondary | ICD-10-CM | POA: Diagnosis not present

## 2023-05-13 DIAGNOSIS — M3392 Dermatopolymyositis, unspecified with myopathy: Secondary | ICD-10-CM | POA: Diagnosis not present

## 2023-05-13 DIAGNOSIS — G8220 Paraplegia, unspecified: Secondary | ICD-10-CM | POA: Diagnosis not present

## 2023-05-13 DIAGNOSIS — E1151 Type 2 diabetes mellitus with diabetic peripheral angiopathy without gangrene: Secondary | ICD-10-CM | POA: Diagnosis not present

## 2023-05-13 DIAGNOSIS — Z794 Long term (current) use of insulin: Secondary | ICD-10-CM | POA: Diagnosis not present

## 2023-05-13 DIAGNOSIS — G4700 Insomnia, unspecified: Secondary | ICD-10-CM | POA: Diagnosis not present

## 2023-05-13 DIAGNOSIS — M810 Age-related osteoporosis without current pathological fracture: Secondary | ICD-10-CM | POA: Diagnosis not present

## 2023-05-13 DIAGNOSIS — N182 Chronic kidney disease, stage 2 (mild): Secondary | ICD-10-CM | POA: Diagnosis not present

## 2023-05-13 DIAGNOSIS — D539 Nutritional anemia, unspecified: Secondary | ICD-10-CM | POA: Diagnosis not present

## 2023-05-13 DIAGNOSIS — M25551 Pain in right hip: Secondary | ICD-10-CM | POA: Diagnosis not present

## 2023-05-13 DIAGNOSIS — E11621 Type 2 diabetes mellitus with foot ulcer: Secondary | ICD-10-CM | POA: Diagnosis not present

## 2023-05-13 DIAGNOSIS — K5900 Constipation, unspecified: Secondary | ICD-10-CM | POA: Diagnosis not present

## 2023-05-13 DIAGNOSIS — F02818 Dementia in other diseases classified elsewhere, unspecified severity, with other behavioral disturbance: Secondary | ICD-10-CM | POA: Diagnosis not present

## 2023-05-13 DIAGNOSIS — M25511 Pain in right shoulder: Secondary | ICD-10-CM | POA: Diagnosis not present

## 2023-05-13 DIAGNOSIS — I10 Essential (primary) hypertension: Secondary | ICD-10-CM | POA: Diagnosis not present

## 2023-05-13 DIAGNOSIS — J189 Pneumonia, unspecified organism: Secondary | ICD-10-CM | POA: Diagnosis not present

## 2023-05-13 DIAGNOSIS — L97528 Non-pressure chronic ulcer of other part of left foot with other specified severity: Secondary | ICD-10-CM | POA: Diagnosis not present

## 2023-05-13 DIAGNOSIS — E039 Hypothyroidism, unspecified: Secondary | ICD-10-CM | POA: Diagnosis not present

## 2023-05-13 DIAGNOSIS — E1122 Type 2 diabetes mellitus with diabetic chronic kidney disease: Secondary | ICD-10-CM | POA: Diagnosis not present

## 2023-05-13 DIAGNOSIS — G9341 Metabolic encephalopathy: Secondary | ICD-10-CM | POA: Diagnosis not present

## 2023-05-13 DIAGNOSIS — D631 Anemia in chronic kidney disease: Secondary | ICD-10-CM | POA: Diagnosis not present

## 2023-05-17 ENCOUNTER — Encounter (HOSPITAL_BASED_OUTPATIENT_CLINIC_OR_DEPARTMENT_OTHER): Payer: Medicare HMO | Attending: Internal Medicine | Admitting: Internal Medicine

## 2023-05-17 DIAGNOSIS — F0390 Unspecified dementia without behavioral disturbance: Secondary | ICD-10-CM | POA: Diagnosis not present

## 2023-05-17 DIAGNOSIS — L97518 Non-pressure chronic ulcer of other part of right foot with other specified severity: Secondary | ICD-10-CM | POA: Diagnosis not present

## 2023-05-17 DIAGNOSIS — L97528 Non-pressure chronic ulcer of other part of left foot with other specified severity: Secondary | ICD-10-CM | POA: Diagnosis not present

## 2023-05-17 DIAGNOSIS — N182 Chronic kidney disease, stage 2 (mild): Secondary | ICD-10-CM | POA: Diagnosis not present

## 2023-05-17 DIAGNOSIS — Z794 Long term (current) use of insulin: Secondary | ICD-10-CM | POA: Diagnosis not present

## 2023-05-17 DIAGNOSIS — E1122 Type 2 diabetes mellitus with diabetic chronic kidney disease: Secondary | ICD-10-CM | POA: Diagnosis not present

## 2023-05-17 DIAGNOSIS — E11621 Type 2 diabetes mellitus with foot ulcer: Secondary | ICD-10-CM | POA: Diagnosis not present

## 2023-05-17 DIAGNOSIS — I129 Hypertensive chronic kidney disease with stage 1 through stage 4 chronic kidney disease, or unspecified chronic kidney disease: Secondary | ICD-10-CM | POA: Diagnosis not present

## 2023-05-17 DIAGNOSIS — G8929 Other chronic pain: Secondary | ICD-10-CM | POA: Diagnosis not present

## 2023-05-17 DIAGNOSIS — L97512 Non-pressure chronic ulcer of other part of right foot with fat layer exposed: Secondary | ICD-10-CM | POA: Diagnosis not present

## 2023-05-17 DIAGNOSIS — E1151 Type 2 diabetes mellitus with diabetic peripheral angiopathy without gangrene: Secondary | ICD-10-CM | POA: Diagnosis not present

## 2023-05-17 DIAGNOSIS — G40909 Epilepsy, unspecified, not intractable, without status epilepticus: Secondary | ICD-10-CM | POA: Diagnosis not present

## 2023-05-18 ENCOUNTER — Emergency Department (HOSPITAL_COMMUNITY): Payer: Medicare HMO

## 2023-05-18 ENCOUNTER — Encounter (HOSPITAL_COMMUNITY): Payer: Self-pay

## 2023-05-18 ENCOUNTER — Inpatient Hospital Stay (HOSPITAL_COMMUNITY)
Admission: EM | Admit: 2023-05-18 | Discharge: 2023-05-24 | DRG: 177 | Disposition: A | Discharge status: Home / Self Care | Payer: Medicare HMO | Attending: Internal Medicine | Admitting: Internal Medicine

## 2023-05-18 ENCOUNTER — Other Ambulatory Visit: Payer: Self-pay

## 2023-05-18 DIAGNOSIS — Z7902 Long term (current) use of antithrombotics/antiplatelets: Secondary | ICD-10-CM

## 2023-05-18 DIAGNOSIS — R918 Other nonspecific abnormal finding of lung field: Secondary | ICD-10-CM | POA: Diagnosis not present

## 2023-05-18 DIAGNOSIS — Z681 Body mass index (BMI) 19 or less, adult: Secondary | ICD-10-CM | POA: Diagnosis not present

## 2023-05-18 DIAGNOSIS — R410 Disorientation, unspecified: Secondary | ICD-10-CM | POA: Diagnosis not present

## 2023-05-18 DIAGNOSIS — M2011 Hallux valgus (acquired), right foot: Secondary | ICD-10-CM | POA: Diagnosis not present

## 2023-05-18 DIAGNOSIS — A419 Sepsis, unspecified organism: Secondary | ICD-10-CM | POA: Diagnosis not present

## 2023-05-18 DIAGNOSIS — E43 Unspecified severe protein-calorie malnutrition: Secondary | ICD-10-CM | POA: Diagnosis present

## 2023-05-18 DIAGNOSIS — E1122 Type 2 diabetes mellitus with diabetic chronic kidney disease: Secondary | ICD-10-CM | POA: Diagnosis not present

## 2023-05-18 DIAGNOSIS — N182 Chronic kidney disease, stage 2 (mild): Secondary | ICD-10-CM | POA: Diagnosis not present

## 2023-05-18 DIAGNOSIS — M3390 Dermatopolymyositis, unspecified, organ involvement unspecified: Secondary | ICD-10-CM | POA: Diagnosis not present

## 2023-05-18 DIAGNOSIS — G9341 Metabolic encephalopathy: Secondary | ICD-10-CM | POA: Diagnosis not present

## 2023-05-18 DIAGNOSIS — Z883 Allergy status to other anti-infective agents status: Secondary | ICD-10-CM

## 2023-05-18 DIAGNOSIS — N189 Chronic kidney disease, unspecified: Secondary | ICD-10-CM | POA: Diagnosis present

## 2023-05-18 DIAGNOSIS — Z90710 Acquired absence of both cervix and uterus: Secondary | ICD-10-CM

## 2023-05-18 DIAGNOSIS — D631 Anemia in chronic kidney disease: Secondary | ICD-10-CM | POA: Diagnosis not present

## 2023-05-18 DIAGNOSIS — R64 Cachexia: Secondary | ICD-10-CM | POA: Diagnosis not present

## 2023-05-18 DIAGNOSIS — N281 Cyst of kidney, acquired: Secondary | ICD-10-CM | POA: Diagnosis not present

## 2023-05-18 DIAGNOSIS — M810 Age-related osteoporosis without current pathological fracture: Secondary | ICD-10-CM | POA: Diagnosis not present

## 2023-05-18 DIAGNOSIS — M19071 Primary osteoarthritis, right ankle and foot: Secondary | ICD-10-CM | POA: Diagnosis not present

## 2023-05-18 DIAGNOSIS — I2510 Atherosclerotic heart disease of native coronary artery without angina pectoris: Secondary | ICD-10-CM | POA: Diagnosis not present

## 2023-05-18 DIAGNOSIS — I129 Hypertensive chronic kidney disease with stage 1 through stage 4 chronic kidney disease, or unspecified chronic kidney disease: Secondary | ICD-10-CM | POA: Diagnosis present

## 2023-05-18 DIAGNOSIS — Z825 Family history of asthma and other chronic lower respiratory diseases: Secondary | ICD-10-CM

## 2023-05-18 DIAGNOSIS — Z882 Allergy status to sulfonamides status: Secondary | ICD-10-CM

## 2023-05-18 DIAGNOSIS — Z7982 Long term (current) use of aspirin: Secondary | ICD-10-CM

## 2023-05-18 DIAGNOSIS — R509 Fever, unspecified: Secondary | ICD-10-CM | POA: Diagnosis not present

## 2023-05-18 DIAGNOSIS — F02818 Dementia in other diseases classified elsewhere, unspecified severity, with other behavioral disturbance: Secondary | ICD-10-CM | POA: Diagnosis not present

## 2023-05-18 DIAGNOSIS — J189 Pneumonia, unspecified organism: Principal | ICD-10-CM | POA: Diagnosis present

## 2023-05-18 DIAGNOSIS — R0600 Dyspnea, unspecified: Secondary | ICD-10-CM | POA: Diagnosis not present

## 2023-05-18 DIAGNOSIS — R54 Age-related physical debility: Secondary | ICD-10-CM | POA: Diagnosis not present

## 2023-05-18 DIAGNOSIS — Z888 Allergy status to other drugs, medicaments and biological substances status: Secondary | ICD-10-CM

## 2023-05-18 DIAGNOSIS — M25551 Pain in right hip: Secondary | ICD-10-CM | POA: Diagnosis not present

## 2023-05-18 DIAGNOSIS — E11621 Type 2 diabetes mellitus with foot ulcer: Secondary | ICD-10-CM | POA: Diagnosis not present

## 2023-05-18 DIAGNOSIS — Z794 Long term (current) use of insulin: Secondary | ICD-10-CM

## 2023-05-18 DIAGNOSIS — Z7189 Other specified counseling: Secondary | ICD-10-CM | POA: Diagnosis not present

## 2023-05-18 DIAGNOSIS — J690 Pneumonitis due to inhalation of food and vomit: Principal | ICD-10-CM | POA: Diagnosis present

## 2023-05-18 DIAGNOSIS — Z66 Do not resuscitate: Secondary | ICD-10-CM | POA: Diagnosis present

## 2023-05-18 DIAGNOSIS — E1165 Type 2 diabetes mellitus with hyperglycemia: Secondary | ICD-10-CM | POA: Diagnosis not present

## 2023-05-18 DIAGNOSIS — Z800 Family history of malignant neoplasm of digestive organs: Secondary | ICD-10-CM

## 2023-05-18 DIAGNOSIS — E039 Hypothyroidism, unspecified: Secondary | ICD-10-CM | POA: Diagnosis present

## 2023-05-18 DIAGNOSIS — I700 Atherosclerosis of aorta: Secondary | ICD-10-CM | POA: Diagnosis not present

## 2023-05-18 DIAGNOSIS — D539 Nutritional anemia, unspecified: Secondary | ICD-10-CM | POA: Diagnosis not present

## 2023-05-18 DIAGNOSIS — E876 Hypokalemia: Secondary | ICD-10-CM | POA: Diagnosis present

## 2023-05-18 DIAGNOSIS — I959 Hypotension, unspecified: Secondary | ICD-10-CM | POA: Diagnosis not present

## 2023-05-18 DIAGNOSIS — F0390 Unspecified dementia without behavioral disturbance: Secondary | ICD-10-CM | POA: Diagnosis not present

## 2023-05-18 DIAGNOSIS — E440 Moderate protein-calorie malnutrition: Secondary | ICD-10-CM | POA: Diagnosis not present

## 2023-05-18 DIAGNOSIS — Z79899 Other long term (current) drug therapy: Secondary | ICD-10-CM

## 2023-05-18 DIAGNOSIS — M3313 Other dermatomyositis without myopathy: Secondary | ICD-10-CM | POA: Diagnosis present

## 2023-05-18 DIAGNOSIS — Z993 Dependence on wheelchair: Secondary | ICD-10-CM

## 2023-05-18 DIAGNOSIS — S91109A Unspecified open wound of unspecified toe(s) without damage to nail, initial encounter: Secondary | ICD-10-CM | POA: Diagnosis not present

## 2023-05-18 DIAGNOSIS — L97528 Non-pressure chronic ulcer of other part of left foot with other specified severity: Secondary | ICD-10-CM | POA: Diagnosis not present

## 2023-05-18 DIAGNOSIS — Z79890 Hormone replacement therapy (postmenopausal): Secondary | ICD-10-CM

## 2023-05-18 DIAGNOSIS — Z8249 Family history of ischemic heart disease and other diseases of the circulatory system: Secondary | ICD-10-CM

## 2023-05-18 DIAGNOSIS — Z886 Allergy status to analgesic agent status: Secondary | ICD-10-CM

## 2023-05-18 DIAGNOSIS — G8220 Paraplegia, unspecified: Secondary | ICD-10-CM | POA: Diagnosis not present

## 2023-05-18 DIAGNOSIS — Z9049 Acquired absence of other specified parts of digestive tract: Secondary | ICD-10-CM

## 2023-05-18 DIAGNOSIS — Z7952 Long term (current) use of systemic steroids: Secondary | ICD-10-CM

## 2023-05-18 DIAGNOSIS — I10 Essential (primary) hypertension: Secondary | ICD-10-CM | POA: Diagnosis not present

## 2023-05-18 DIAGNOSIS — E11649 Type 2 diabetes mellitus with hypoglycemia without coma: Secondary | ICD-10-CM | POA: Diagnosis not present

## 2023-05-18 DIAGNOSIS — Z1152 Encounter for screening for COVID-19: Secondary | ICD-10-CM | POA: Diagnosis not present

## 2023-05-18 DIAGNOSIS — M25511 Pain in right shoulder: Secondary | ICD-10-CM | POA: Diagnosis not present

## 2023-05-18 DIAGNOSIS — E1151 Type 2 diabetes mellitus with diabetic peripheral angiopathy without gangrene: Secondary | ICD-10-CM | POA: Diagnosis present

## 2023-05-18 DIAGNOSIS — R4182 Altered mental status, unspecified: Secondary | ICD-10-CM | POA: Diagnosis not present

## 2023-05-18 DIAGNOSIS — G9340 Encephalopathy, unspecified: Secondary | ICD-10-CM | POA: Diagnosis not present

## 2023-05-18 DIAGNOSIS — E7800 Pure hypercholesterolemia, unspecified: Secondary | ICD-10-CM | POA: Diagnosis present

## 2023-05-18 DIAGNOSIS — R0902 Hypoxemia: Secondary | ICD-10-CM | POA: Diagnosis not present

## 2023-05-18 DIAGNOSIS — Z833 Family history of diabetes mellitus: Secondary | ICD-10-CM

## 2023-05-18 DIAGNOSIS — I6523 Occlusion and stenosis of bilateral carotid arteries: Secondary | ICD-10-CM | POA: Diagnosis not present

## 2023-05-18 DIAGNOSIS — E871 Hypo-osmolality and hyponatremia: Secondary | ICD-10-CM | POA: Diagnosis present

## 2023-05-18 DIAGNOSIS — R9431 Abnormal electrocardiogram [ECG] [EKG]: Secondary | ICD-10-CM | POA: Diagnosis not present

## 2023-05-18 DIAGNOSIS — M3392 Dermatopolymyositis, unspecified with myopathy: Secondary | ICD-10-CM | POA: Diagnosis not present

## 2023-05-18 DIAGNOSIS — K219 Gastro-esophageal reflux disease without esophagitis: Secondary | ICD-10-CM | POA: Diagnosis present

## 2023-05-18 DIAGNOSIS — Z87891 Personal history of nicotine dependence: Secondary | ICD-10-CM

## 2023-05-18 DIAGNOSIS — G4700 Insomnia, unspecified: Secondary | ICD-10-CM | POA: Diagnosis not present

## 2023-05-18 DIAGNOSIS — Z515 Encounter for palliative care: Secondary | ICD-10-CM | POA: Diagnosis not present

## 2023-05-18 DIAGNOSIS — L89152 Pressure ulcer of sacral region, stage 2: Secondary | ICD-10-CM | POA: Diagnosis not present

## 2023-05-18 DIAGNOSIS — K5900 Constipation, unspecified: Secondary | ICD-10-CM | POA: Diagnosis not present

## 2023-05-18 DIAGNOSIS — Z743 Need for continuous supervision: Secondary | ICD-10-CM | POA: Diagnosis not present

## 2023-05-18 DIAGNOSIS — L97518 Non-pressure chronic ulcer of other part of right foot with other specified severity: Secondary | ICD-10-CM | POA: Diagnosis not present

## 2023-05-18 DIAGNOSIS — N3281 Overactive bladder: Secondary | ICD-10-CM | POA: Diagnosis present

## 2023-05-18 DIAGNOSIS — R404 Transient alteration of awareness: Secondary | ICD-10-CM | POA: Diagnosis not present

## 2023-05-18 LAB — URINALYSIS, W/ REFLEX TO CULTURE (INFECTION SUSPECTED)
Bilirubin Urine: NEGATIVE
Glucose, UA: NEGATIVE mg/dL
Hgb urine dipstick: NEGATIVE
Ketones, ur: NEGATIVE mg/dL
Leukocytes,Ua: NEGATIVE
Nitrite: NEGATIVE
Protein, ur: NEGATIVE mg/dL
Specific Gravity, Urine: 1.011 (ref 1.005–1.030)
pH: 8 (ref 5.0–8.0)

## 2023-05-18 LAB — CBC WITH DIFFERENTIAL/PLATELET
Abs Immature Granulocytes: 0.14 10*3/uL — ABNORMAL HIGH (ref 0.00–0.07)
Basophils Absolute: 0 10*3/uL (ref 0.0–0.1)
Basophils Relative: 0 %
Eosinophils Absolute: 0 10*3/uL (ref 0.0–0.5)
Eosinophils Relative: 0 %
HCT: 31.1 % — ABNORMAL LOW (ref 36.0–46.0)
Hemoglobin: 10.1 g/dL — ABNORMAL LOW (ref 12.0–15.0)
Immature Granulocytes: 1 %
Lymphocytes Relative: 3 %
Lymphs Abs: 0.7 10*3/uL (ref 0.7–4.0)
MCH: 32.1 pg (ref 26.0–34.0)
MCHC: 32.5 g/dL (ref 30.0–36.0)
MCV: 98.7 fL (ref 80.0–100.0)
Monocytes Absolute: 0.6 10*3/uL (ref 0.1–1.0)
Monocytes Relative: 3 %
Neutro Abs: 18.5 10*3/uL — ABNORMAL HIGH (ref 1.7–7.7)
Neutrophils Relative %: 93 %
Platelets: 310 10*3/uL (ref 150–400)
RBC: 3.15 MIL/uL — ABNORMAL LOW (ref 3.87–5.11)
RDW: 14.9 % (ref 11.5–15.5)
WBC: 20 10*3/uL — ABNORMAL HIGH (ref 4.0–10.5)
nRBC: 0 % (ref 0.0–0.2)

## 2023-05-18 LAB — I-STAT VENOUS BLOOD GAS, ED
Acid-Base Excess: 6 mmol/L — ABNORMAL HIGH (ref 0.0–2.0)
Bicarbonate: 32.9 mmol/L — ABNORMAL HIGH (ref 20.0–28.0)
Calcium, Ion: 1.32 mmol/L (ref 1.15–1.40)
HCT: 32 % — ABNORMAL LOW (ref 36.0–46.0)
Hemoglobin: 10.9 g/dL — ABNORMAL LOW (ref 12.0–15.0)
O2 Saturation: 77 %
Potassium: 4.4 mmol/L (ref 3.5–5.1)
Sodium: 134 mmol/L — ABNORMAL LOW (ref 135–145)
TCO2: 35 mmol/L — ABNORMAL HIGH (ref 22–32)
pCO2, Ven: 58.6 mm[Hg] (ref 44–60)
pH, Ven: 7.357 (ref 7.25–7.43)
pO2, Ven: 44 mm[Hg] (ref 32–45)

## 2023-05-18 LAB — COMPREHENSIVE METABOLIC PANEL
ALT: 37 U/L (ref 0–44)
AST: 35 U/L (ref 15–41)
Albumin: 3.2 g/dL — ABNORMAL LOW (ref 3.5–5.0)
Alkaline Phosphatase: 79 U/L (ref 38–126)
Anion gap: 10 (ref 5–15)
BUN: 17 mg/dL (ref 8–23)
CO2: 30 mmol/L (ref 22–32)
Calcium: 10.3 mg/dL (ref 8.9–10.3)
Chloride: 95 mmol/L — ABNORMAL LOW (ref 98–111)
Creatinine, Ser: 0.92 mg/dL (ref 0.44–1.00)
GFR, Estimated: >60 mL/min (ref >60)
Glucose, Bld: 184 mg/dL — ABNORMAL HIGH (ref 70–99)
Potassium: 4.3 mmol/L (ref 3.5–5.1)
Sodium: 135 mmol/L (ref 135–145)
Total Bilirubin: 0.6 mg/dL (ref 0.3–1.2)
Total Protein: 6.9 g/dL (ref 6.5–8.1)

## 2023-05-18 LAB — CBG MONITORING, ED: Glucose-Capillary: 111 mg/dL — ABNORMAL HIGH (ref 70–99)

## 2023-05-18 LAB — BRAIN NATRIURETIC PEPTIDE: B Natriuretic Peptide: 125.8 pg/mL — ABNORMAL HIGH (ref 0.0–100.0)

## 2023-05-18 LAB — APTT: aPTT: 32 s (ref 24–36)

## 2023-05-18 LAB — I-STAT CG4 LACTIC ACID, ED: Lactic Acid, Venous: 1.9 mmol/L (ref 0.5–1.9)

## 2023-05-18 LAB — RESP PANEL BY RT-PCR (RSV, FLU A&B, COVID)  RVPGX2
Influenza A by PCR: NEGATIVE
Influenza B by PCR: NEGATIVE
Resp Syncytial Virus by PCR: NEGATIVE
SARS Coronavirus 2 by RT PCR: NEGATIVE

## 2023-05-18 LAB — CK: Total CK: 19 U/L — ABNORMAL LOW (ref 38–234)

## 2023-05-18 LAB — T4, FREE: Free T4: 1.19 ng/dL — ABNORMAL HIGH (ref 0.61–1.12)

## 2023-05-18 LAB — PROTIME-INR
INR: 1.1 (ref 0.8–1.2)
Prothrombin Time: 14.2 s (ref 11.4–15.2)

## 2023-05-18 LAB — TSH: TSH: 1.472 u[IU]/mL (ref 0.350–4.500)

## 2023-05-18 LAB — AMMONIA: Ammonia: <10 umol/L (ref 9–35)

## 2023-05-18 MED ORDER — VANCOMYCIN HCL 1250 MG/250ML IV SOLN
1250.0000 mg | Freq: Once | INTRAVENOUS | Status: AC
  Administered 2023-05-18: 1250 mg via INTRAVENOUS
  Filled 2023-05-18: qty 250

## 2023-05-18 MED ORDER — CLOPIDOGREL BISULFATE 75 MG PO TABS
75.0000 mg | ORAL_TABLET | Freq: Every day | ORAL | Status: DC
  Administered 2023-05-19 – 2023-05-24 (×5): 75 mg via ORAL
  Filled 2023-05-18 (×5): qty 1

## 2023-05-18 MED ORDER — PREDNISONE 10 MG PO TABS
5.0000 mg | ORAL_TABLET | Freq: Every day | ORAL | Status: DC
  Administered 2023-05-19 – 2023-05-24 (×5): 5 mg via ORAL
  Filled 2023-05-18 (×5): qty 1

## 2023-05-18 MED ORDER — EZETIMIBE 10 MG PO TABS
10.0000 mg | ORAL_TABLET | Freq: Every day | ORAL | Status: DC
  Administered 2023-05-18 – 2023-05-23 (×5): 10 mg via ORAL
  Filled 2023-05-18 (×5): qty 1

## 2023-05-18 MED ORDER — LACTATED RINGERS IV BOLUS (SEPSIS)
1000.0000 mL | Freq: Once | INTRAVENOUS | Status: AC
  Administered 2023-05-18: 1000 mL via INTRAVENOUS

## 2023-05-18 MED ORDER — FERROUS SULFATE 300 (60 FE) MG/5ML PO SOLN
300.0000 mg | ORAL | Status: DC
  Administered 2023-05-19: 300 mg via ORAL
  Filled 2023-05-18 (×3): qty 5

## 2023-05-18 MED ORDER — INSULIN ASPART 100 UNIT/ML IJ SOLN
0.0000 [IU] | Freq: Three times a day (TID) | INTRAMUSCULAR | Status: DC
  Administered 2023-05-19: 3 [IU] via SUBCUTANEOUS
  Administered 2023-05-19: 7 [IU] via SUBCUTANEOUS
  Administered 2023-05-20: 1 [IU] via SUBCUTANEOUS
  Administered 2023-05-20 – 2023-05-21 (×3): 3 [IU] via SUBCUTANEOUS

## 2023-05-18 MED ORDER — ACETAMINOPHEN 650 MG RE SUPP
650.0000 mg | Freq: Four times a day (QID) | RECTAL | Status: DC | PRN
Start: 2023-05-18 — End: 2023-05-24

## 2023-05-18 MED ORDER — IOHEXOL 350 MG/ML SOLN
75.0000 mL | Freq: Once | INTRAVENOUS | Status: AC | PRN
  Administered 2023-05-18: 75 mL via INTRAVENOUS

## 2023-05-18 MED ORDER — LEVETIRACETAM 500 MG PO TABS
500.0000 mg | ORAL_TABLET | Freq: Two times a day (BID) | ORAL | Status: DC
  Administered 2023-05-18 – 2023-05-24 (×10): 500 mg via ORAL
  Filled 2023-05-18 (×10): qty 1

## 2023-05-18 MED ORDER — SENNOSIDES-DOCUSATE SODIUM 8.6-50 MG PO TABS: 1.0000 | ORAL_TABLET | Freq: Every evening | ORAL | Status: DC | PRN

## 2023-05-18 MED ORDER — INSULIN DETEMIR 100 UNIT/ML ~~LOC~~ SOLN
5.0000 [IU] | Freq: Every day | SUBCUTANEOUS | Status: DC
  Filled 2023-05-18 (×3): qty 0.05

## 2023-05-18 MED ORDER — FAMOTIDINE 20 MG PO TABS
20.0000 mg | ORAL_TABLET | Freq: Every day | ORAL | Status: DC
  Administered 2023-05-18 – 2023-05-23 (×5): 20 mg via ORAL
  Filled 2023-05-18 (×5): qty 1

## 2023-05-18 MED ORDER — ENOXAPARIN SODIUM 40 MG/0.4ML IJ SOSY
40.0000 mg | PREFILLED_SYRINGE | INTRAMUSCULAR | Status: DC
  Administered 2023-05-19 – 2023-05-24 (×6): 40 mg via SUBCUTANEOUS
  Filled 2023-05-18 (×6): qty 0.4

## 2023-05-18 MED ORDER — ASPIRIN 81 MG PO TBEC
81.0000 mg | DELAYED_RELEASE_TABLET | Freq: Every morning | ORAL | Status: DC
  Administered 2023-05-19 – 2023-05-23 (×4): 81 mg via ORAL
  Filled 2023-05-18 (×4): qty 1

## 2023-05-18 MED ORDER — SODIUM CHLORIDE 0.9 % IV SOLN
2.0000 g | INTRAVENOUS | Status: DC
  Administered 2023-05-19 – 2023-05-24 (×6): 2 g via INTRAVENOUS
  Filled 2023-05-18 (×7): qty 20

## 2023-05-18 MED ORDER — DOXYCYCLINE HYCLATE 100 MG IV SOLR
100.0000 mg | Freq: Two times a day (BID) | INTRAVENOUS | Status: DC
  Administered 2023-05-18 – 2023-05-19 (×2): 100 mg via INTRAVENOUS
  Filled 2023-05-18 (×2): qty 100

## 2023-05-18 MED ORDER — ONDANSETRON HCL 4 MG PO TABS: 4.0000 mg | ORAL_TABLET | Freq: Four times a day (QID) | ORAL | Status: DC | PRN

## 2023-05-18 MED ORDER — ACETAMINOPHEN 325 MG PO TABS: 650.0000 mg | ORAL_TABLET | Freq: Four times a day (QID) | ORAL | Status: DC | PRN

## 2023-05-18 MED ORDER — ONDANSETRON HCL 4 MG/2ML IJ SOLN: 4.0000 mg | Freq: Four times a day (QID) | INTRAMUSCULAR | Status: DC | PRN

## 2023-05-18 MED ORDER — LEVOTHYROXINE SODIUM 100 MCG PO TABS
100.0000 ug | ORAL_TABLET | Freq: Every day | ORAL | Status: DC
  Administered 2023-05-19 – 2023-05-24 (×5): 100 ug via ORAL
  Filled 2023-05-18 (×5): qty 1

## 2023-05-18 MED ORDER — SODIUM CHLORIDE 0.9 % IV SOLN
1.0000 g | Freq: Once | INTRAVENOUS | Status: AC
  Administered 2023-05-18: 1 g via INTRAVENOUS
  Filled 2023-05-18: qty 10

## 2023-05-18 MED ORDER — FOLIC ACID 1 MG PO TABS
1.0000 mg | ORAL_TABLET | Freq: Every day | ORAL | Status: DC
  Administered 2023-05-18 – 2023-05-23 (×5): 1 mg via ORAL
  Filled 2023-05-18 (×5): qty 1

## 2023-05-18 MED ORDER — INSULIN ASPART 100 UNIT/ML IJ SOLN
0.0000 [IU] | Freq: Every day | INTRAMUSCULAR | Status: DC
  Administered 2023-05-19: 4 [IU] via SUBCUTANEOUS
  Administered 2023-05-20: 3 [IU] via SUBCUTANEOUS

## 2023-05-18 NOTE — ED Provider Notes (Signed)
[place] EMERGENCY DEPARTMENT AT [facility] Provider Note  CSN: [id number] Arrival date & time: 05/18/23 1539  Chief Complaint(s) Altered Mental Status  HPI [name] [name] is a 89 y.o. female with past medical history as below, significant for CKD,, dementia, DM, GERD, HLD, HTN, thyroid disease who presents to the ED with complaint of AMS, fever.  Patient here from home, lives with family.  Last seen normal around 0730 this morning, patient unable to contribute to history.  Per EMS, patient febrile on arrival, glucose elevated, reduced responsiveness.  Placed on 2 L nasal cannula.  Patient is not speaking but she will follow commands intermittently. Febrile en route with EMS  Level 5 caveat, AMS, dementia  Past Medical History Past Medical History:  Diagnosis Date   Arthritis    "knees, legs" (06/10/2016)   Ascites    Chronic kidney disease    "related to my diabetes"   Chronic lower back pain    Dementia without behavioral disturbance (HCC)    Diabetes mellitus without complication (HCC)    Eczema    GERD (gastroesophageal reflux disease)    High cholesterol    Hyperlipidemia    Hypertension    Hypothyroidism    OAB (overactive bladder)    Osteoporosis    Thyroid disease    Type II diabetes mellitus (HCC)    Urticaria    Patient Active Problem List   Diagnosis Date Noted   Acute metabolic encephalopathy 04/30/2023   Insulin dependent type 2 diabetes mellitus (HCC) 04/30/2023   Macrocytic anemia 04/30/2023   CAP (community acquired pneumonia) 03/16/2023   COVID-19 02/24/2023   Weakness 02/24/2023   AMS (altered mental status) 02/15/2023   Paraparesis (HCC) 01/04/2023   Other specified abnormal findings of blood chemistry 01/04/2023   Cervical radiculitis 01/04/2023   Cervical spinal stenosis 01/04/2023   UTI (urinary tract infection) 10/03/2022   Sepsis due to urinary tract infection (HCC) 10/02/2022   Normocytic anemia 07/27/2022   Dementia  without behavioral disturbance (HCC) 07/27/2022   Malnutrition of moderate degree 05/11/2022   PNA (pneumonia) 01/19/2022   Syncope 01/13/2022   Syncope and collapse 09/13/2021   Dermatopolymyositis, unspecified, organ involvement unspecified (HCC) 09/13/2021   Poor appetite 09/13/2021   Hyponatremia 09/13/2021   Uncontrolled diabetes mellitus with hypoglycemia without coma, with long-term current use of insulin (HCC) 09/13/2021   Sacral ulcer (HCC) 09/13/2021   Oropharyngeal dysphagia 05/15/2021   Allergic conjunctivitis of both eyes 03/15/2020   Peripheral artery disease (HCC) 10/24/2019   Malnutrition (HCC) 10/10/2019   History of seizure 10/08/2019   Right shoulder pain 08/21/2019   Leukocytosis 08/21/2019   Acute encephalopathy 08/18/2019   Enterocolitis 08/18/2019   Hyperglycemia 08/18/2019   Chronic kidney disease    Pressure injury of skin 12/09/2016   SBO (small bowel obstruction) s/p lysis of adhesions 12/03/2016 12/04/2016   Acute urinary retention 11/27/2016   Ascites 11/27/2016   Abdominal distension    Facial droop    Hyperkalemia 11/25/2016   Sepsis (HCC) 11/25/2016   Acute renal failure superimposed on stage 2 chronic kidney disease (HCC) 11/25/2016   Delirium 11/25/2016   Age-related osteoporosis without current pathological fracture 10/01/2016   GERD (gastroesophageal reflux disease) 06/10/2016   Insomnia 06/10/2016   Viral gastroenteritis    Right hip pain 05/06/2015   Vaginal atrophy 11/01/2013   ECZEMA 07/24/2008   INTERMITTENT VERTIGO 07/24/2008   Constipation 11/16/2007   INSOMNIA 08/12/2007   Oral thrush 08/03/2007   Hypertension with hx of  orthostatic hypotension 02/14/2007   Allergic rhinitis due to allergen 02/14/2007   Hypothyroidism 01/26/2007   Uncontrolled type 2 diabetes mellitus with hypoglycemia, with long-term current use of insulin (HCC) 01/26/2007   GERD 01/26/2007   DEGENERATIVE JOINT DISEASE 01/26/2007   Home Medication(s) Prior  to Admission medications   Medication Sig Start Date End Date Taking? Authorizing Provider  acetaminophen (TYLENOL) 650 MG CR tablet Take 650 mg by mouth every 8 (eight) hours as needed for pain.    [provider]  BAYER LOW DOSE 81 MG EC tablet Take 81 mg by mouth in the morning. Swallow whole.    [provider]  cetirizine (ZYRTEC) 10 MG tablet Take 10 mg by mouth 3 (three) times daily as needed for allergies or rhinitis.    [provider]  Cholecalciferol (VITAMIN D-3 PO) Take 2,000 Units by mouth daily.    [provider]  clopidogrel (PLAVIX) 75 MG tablet Take 1 tablet (75 mg total) by mouth daily with breakfast. 02/09/23   Baglia, Corrina, PA-C  CRANBERRY PO Take 1 tablet by mouth daily.    [provider]  ezetimibe (ZETIA) 10 MG tablet Take 1 tablet (10 mg total) by mouth daily. Patient taking differently: Take 10 mg by mouth at bedtime. 02/09/23 02/09/24  Chuck Hint, MD  famotidine (PEPCID) 20 MG tablet Take 20 mg by mouth at bedtime.    [provider]  ferrous sulfate 220 (44 Fe) MG/5ML solution Take 330 mg by mouth 3 (three) times a week. 12/27/22   [provider]  fluconazole (DIFLUCAN) 100 MG tablet Take 100 mg by mouth 3 (three) times a week. 04/11/23   [provider]  folic acid (FOLVITE) 1 MG tablet Take 1 mg by mouth at bedtime.    [provider]  insulin aspart (NOVOLOG FLEXPEN) 100 UNIT/ML FlexPen Inject 2-10 Units into the skin See admin instructions. Inject 2-10 units into the skin three times a day with meals, per sliding scale:  Breakfast: BGL 80-199 = 8 units; 200-299 = 9 units; 300 or greater = 10 units Lunch: BGL 80-199 = 5 units; 200-299 = 6 units; 300 or greater = 7 units Supper/evening meal: BGL 80-199 = 2 units; 200-299 = 3 units; 300 or greater = 4 units    [provider]  insulin detemir (LEVEMIR FLEXTOUCH) 100 UNIT/ML FlexPen Inject 3-7 Units into the skin See  admin instructions. Inject 7 units into the skin in the morning and 3 units at bedtime PRN    [provider]  levETIRAcetam (KEPPRA) 500 MG tablet Take 500 mg by mouth in the morning and at bedtime. 01/11/23   [provider]  liver oil-zinc oxide (DESITIN) 40 % ointment Apply topically 3 (three) times daily. Patient not taking: Reported on 05/01/2023 09/16/21   Rolly Salter, MD  loratadine (CLARITIN) 10 MG tablet Take 10 mg by mouth daily.    [provider]  methotrexate (RHEUMATREX) 2.5 MG tablet Take 1 tablet (2.5 mg total) by mouth once a week. Caution:Chemotherapy. Protect from light. Patient taking differently: Take 12.5 mg by mouth every Monday. Caution:Chemotherapy. Protect from light. 01/27/22   Marinda Elk, MD  metoprolol tartrate (LOPRESSOR) 25 MG tablet Take 1 tablet (25 mg total) by mouth 2 (two) times daily. Patient taking differently: Take 12.5 mg by mouth as needed (if Systolic number is over 160 for over 2 hours). 01/20/22   Marinda Elk, MD  midodrine (PROAMATINE) 2.5 MG tablet  Take 1 tablet (2.5 mg total) by mouth 3 (three) times daily with meals. 05/05/23 06/04/23  Jerald Kief, MD  Multiple Vitamin (MULTIVITAMIN WITH MINERALS) TABS tablet Take 1 tablet by mouth daily with breakfast.    [provider]  PLAQUENIL 200 MG tablet Take 200 mg by mouth at bedtime. 10/07/22   [provider]  predniSONE (DELTASONE) 5 MG tablet Take 5 mg by mouth daily with breakfast.    [provider]  SYNTHROID 100 MCG tablet Take 100 mcg by mouth daily before breakfast.    [provider]  triamcinolone ointment (KENALOG) 0.1 % Apply 1 Application topically 2 (two) times daily as needed (skin irritation- affected areas). 07/16/22   [provider]                                                                                                                                    Past Surgical History Past  Surgical History:  Procedure Laterality Date   ABDOMINAL AORTOGRAM W/LOWER EXTREMITY N/A 02/08/2023   Procedure: ABDOMINAL AORTOGRAM W/LOWER EXTREMITY;  Surgeon: Chuck Hint, MD;  Location: [facility] INVASIVE CV LAB;  Service: Vascular;  Laterality: N/A;   ABDOMINAL HYSTERECTOMY     KNEE ARTHROSCOPY     LAPAROTOMY N/A 12/03/2016   Procedure: EXPLORATORY LAPAROTOMY, LYSIS OF ADHESIONS;  Surgeon: Darnell Level, MD;  Location: [facility] ORS;  Service: General;  Laterality: N/A;   LOOP RECORDER INSERTION N/A 08/23/2019   Procedure: LOOP RECORDER INSERTION;  Surgeon: Marinus Maw, MD;  Location: [facility] INVASIVE CV LAB;  Service: Cardiovascular;  Laterality: N/A;   PERIPHERAL VASCULAR INTERVENTION  02/08/2023   Procedure: PERIPHERAL VASCULAR INTERVENTION;  Surgeon: Chuck Hint, MD;  Location: [facility] INVASIVE CV LAB;  Service: Vascular;;   vocal cord polyps     Family History Family History  Problem Relation Age of Onset   Diabetes Mother    Hypertension Mother    Asthma Father    Diabetes Sister    Stomach cancer Sister    Diabetes Brother    Allergic rhinitis Neg Hx    Eczema Neg Hx    Urticaria Neg Hx     Social History Social History   Tobacco Use   Smoking status: Never   Smokeless tobacco: Former    Types: Chew    Quit date: 1985  Vaping Use   Vaping status: Never Used  Substance Use Topics   Alcohol use: No   Drug use: No   Allergies Nsaids, Biaxin [clarithromycin], Statins, Bactrim [sulfamethoxazole-trimethoprim], Lisinopril, and Sulfa antibiotics  Review of Systems Review of Systems  Unable to perform ROS: Mental status change  Constitutional:  Positive for fever.    Physical Exam Vital Signs  I have reviewed the triage vital signs BP 115/68   Pulse 78   Temp 97.9 F (36.6 C) (Axillary)   Resp 18   SpO2 96%  Physical Exam Vitals and  nursing note reviewed. Exam conducted with a chaperone present.  Constitutional:      Appearance: She is ill-appearing. She  is not toxic-appearing.     Comments: Cachectic  HENT:     Head: Normocephalic and atraumatic.     Right Ear: External ear normal.     Left Ear: External ear normal.     Nose: Nose normal.     Mouth/Throat:     Mouth: Mucous membranes are dry.  Eyes:     General: No scleral icterus.       Right eye: No discharge.        Left eye: No discharge.  Cardiovascular:     Rate and Rhythm: Regular rhythm. Tachycardia present.     Pulses: Normal pulses.     Heart sounds: Normal heart sounds. No murmur heard. Pulmonary:     Effort: Pulmonary effort is normal.     Breath sounds: Normal breath sounds.  Abdominal:     General: Abdomen is flat.     Palpations: Abdomen is soft.     Tenderness: There is no abdominal tenderness.  Musculoskeletal:     Cervical back: No rigidity.     Right lower leg: No edema.     Left lower leg: No edema.  Skin:    General: Skin is warm.     Capillary Refill: Capillary refill takes less than 2 seconds.  Neurological:     Mental Status: She is lethargic.     Cranial Nerves: No facial asymmetry.     Sensory: No sensory deficit.     Comments: Moving all 4 extremities, response to noxious stimulus to all 4 extremities.  Strength symmetric all 4 extremities  Difficulty following commands, unable to follow coordination testing  Gait testing deferred secondary to patient safety.      ED Results and Treatments Labs (all labs ordered are listed, but only abnormal results are displayed) Labs Reviewed  CBC WITH DIFFERENTIAL/PLATELET - Abnormal; Notable for the following components:      Result Value   WBC 20.0 (*)    RBC 3.15 (*)    Hemoglobin 10.1 (*)    HCT 31.1 (*)    Neutro Abs 18.5 (*)    Abs Immature Granulocytes 0.14 (*)    All other components within normal limits  I-STAT VENOUS BLOOD GAS, ED - Abnormal; Notable for the following components:   Bicarbonate 32.9 (*)    TCO2 35 (*)    Acid-Base Excess 6.0 (*)    Sodium 134 (*)    HCT 32.0 (*)     Hemoglobin 10.9 (*)    All other components within normal limits  CULTURE, BLOOD (ROUTINE X 2)  CULTURE, BLOOD (ROUTINE X 2)  RESP PANEL BY RT-PCR (RSV, FLU A&B, COVID)  RVPGX2  PROTIME-INR  APTT  AMMONIA  TSH  COMPREHENSIVE METABOLIC PANEL  BRAIN NATRIURETIC PEPTIDE  URINALYSIS, W/ REFLEX TO CULTURE (INFECTION SUSPECTED)  CK  T4, FREE  LEVETIRACETAM LEVEL  I-STAT CG4 LACTIC ACID, ED  I-STAT CG4 LACTIC ACID, ED  Radiology No results found.  Pertinent labs & imaging results that were available during my care of the patient were reviewed by me and considered in my medical decision making (see MDM for details).  Medications Ordered in ED Medications  lactated ringers bolus 1,000 mL (1,000 mLs Intravenous New Bag/Given 05/18/23 1653)  cefTRIAXone (ROCEPHIN) 1 g in sodium chloride 0.9 % 100 mL IVPB (0 g Intravenous Stopped 05/18/23 1652)                                                                                                                                     Procedures Procedures  (including critical care time)  Medical Decision Making / ED Course    Medical Decision Making:    [name] [name] is a 89 y.o. female with past medical history as below, significant for CKD,, dementia, DM, GERD, HLD, HTN, thyroid disease who presents to the ED with complaint of AMS, fever.. The complaint involves an extensive differential diagnosis and also carries with it a high risk of complications and morbidity.  Serious etiology was considered. Ddx includes but is not limited to: Differential diagnoses for altered mental status includes but is not exclusive to alcohol, illicit or prescription medications, intracranial pathology such as stroke, intracerebral hemorrhage, fever or infectious causes including sepsis, hypoxemia, uremia, trauma, endocrine related disorders  such as diabetes, hypoglycemia, thyroid-related diseases, etc. Differential diagnosis for adult fever includes but is not exclusive to community-acquired pneumonia, urinary tract infection, acute cholecystitis, viral syndrome, cellulitis, tick bourne disease,  decubitus ulcer, necrotizing fasciitis, meningitis, encephalitis, influenza, etc.   Complete initial physical exam performed, notably the patient  was HDS, somnolent .    Reviewed and confirmed nursing documentation for past medical history, family history, social history.  Vital signs reviewed.        Patient arrives from home by EMS.  Concern for altered mental status with last known normal earlier this morning around 7:30 AM.  She is not candidate for TNK given duration of symptoms addition to nonfocal exam.  Favor her altered mental status is likely metabolic given fever.  Broad workup initiated, I have high suspicion for infectious etiology.  Give IV fluids, sepsis bundle. Chest x-ray on wet read concerning for pneumonia.   Patient will require admission.   Pending formal radiology interpretation handoff to incoming EDP pending remainder of labs and imaging.                 Additional history obtained: -Additional history obtained from EMS -External records from outside source obtained and reviewed including: Chart review including previous notes, labs, imaging, consultation notes including  Home medications, prior ED visits and admission Multiple admissions over the past 6 months   Lab Tests: -I ordered, reviewed, and interpreted labs.   The pertinent results include:   Labs Reviewed  CBC WITH DIFFERENTIAL/PLATELET - Abnormal; Notable for the following components:  Result Value   WBC 20.0 (*)    RBC 3.15 (*)    Hemoglobin 10.1 (*)    HCT 31.1 (*)    Neutro Abs 18.5 (*)    Abs Immature Granulocytes 0.14 (*)    All other components within normal limits  I-STAT VENOUS BLOOD GAS, ED - Abnormal; Notable for  the following components:   Bicarbonate 32.9 (*)    TCO2 35 (*)    Acid-Base Excess 6.0 (*)    Sodium 134 (*)    HCT 32.0 (*)    Hemoglobin 10.9 (*)    All other components within normal limits  CULTURE, BLOOD (ROUTINE X 2)  CULTURE, BLOOD (ROUTINE X 2)  RESP PANEL BY RT-PCR (RSV, FLU A&B, COVID)  RVPGX2  PROTIME-INR  APTT  AMMONIA  TSH  COMPREHENSIVE METABOLIC PANEL  BRAIN NATRIURETIC PEPTIDE  URINALYSIS, W/ REFLEX TO CULTURE (INFECTION SUSPECTED)  CK  T4, FREE  LEVETIRACETAM LEVEL  I-STAT CG4 LACTIC ACID, ED  I-STAT CG4 LACTIC ACID, ED    Notable for leukocytosis noted, labs pending otherwise  EKG   EKG Interpretation Date/Time:  Tuesday May 18 2023 16:05:58 EDT Ventricular Rate:  77 PR Interval:  221 QRS Duration:  84 QT Interval:  413 QTC Calculation: 468 R Axis:   42  Text Interpretation: Sinus rhythm Prolonged PR interval Confirmed by Gloris Manchester ([id number]) on 05/18/2023 4:08:34 PM         Imaging Studies ordered: I ordered imaging studies including x-ray, CT head I independently visualized the following imaging with scope of interpretation limited to determining acute life threatening conditions related to emergency care; x-ray concerning for pneumonia, CT head without large-volume ICH; pending formal radiology interpretation.  Medicines ordered and prescription drug management: Meds ordered this encounter  Medications   lactated ringers bolus 1,000 mL   cefTRIAXone (ROCEPHIN) 1 g in sodium chloride 0.9 % 100 mL IVPB    Order Specific Question:   Antibiotic Indication:    Answer:   UTI    -I have reviewed the patients home medicines and have made adjustments as needed   Consultations Obtained: na   Cardiac Monitoring: The patient was maintained on a cardiac monitor.  I personally viewed and interpreted the cardiac monitored which showed an underlying rhythm of: NSR Continuous pulse oximetry interpreted by myself, 96% on 2LNC.    Social  Determinants of Health:  Diagnosis or treatment significantly limited by social determinants of health: former smoker   Reevaluation: After the interventions noted above, I reevaluated the patient and found that they have improved  Co morbidities that complicate the patient evaluation  Past Medical History:  Diagnosis Date   Arthritis    "knees, legs" (06/10/2016)   Ascites    Chronic kidney disease    "related to my diabetes"   Chronic lower back pain    Dementia without behavioral disturbance (HCC)    Diabetes mellitus without complication (HCC)    Eczema    GERD (gastroesophageal reflux disease)    High cholesterol    Hyperlipidemia    Hypertension    Hypothyroidism    OAB (overactive bladder)    Osteoporosis    Thyroid disease    Type II diabetes mellitus (HCC)    Urticaria       Dispostion: Disposition decision including need for hospitalization was considered, and patient disposition pending at time of sign out.    Final Clinical Impression(s) / ED Diagnoses Final diagnoses:  Altered mental status, unspecified altered mental  status type        Sloan Leiter, DO 05/18/23 1728

## 2023-05-18 NOTE — ED Notes (Signed)
Went to CT

## 2023-05-18 NOTE — ED Notes (Signed)
Patient transported to CT 

## 2023-05-18 NOTE — ED Provider Notes (Signed)
Care of patient assumed from Dr. Wallace Cullens.  This patient presents for altered mental status since this morning.  EMS noted low-grade fever.  She is currently getting IV fluids and ceftriaxone.  Workup is pending.  Admission is anticipated. Physical Exam  BP (!) 123/50   Pulse (!) 59   Temp 98.5 F (36.9 C) (Axillary)   Resp 15   Ht 5\' 5"  (1.651 m)   Wt 54 kg Comment: from 05/01/23  SpO2 96%   BMI 19.80 kg/m   Physical Exam Vitals and nursing note reviewed.  Constitutional:      General: She is not in acute distress.    Appearance: She is well-developed. She is ill-appearing. She is not toxic-appearing or diaphoretic.  HENT:     Head: Normocephalic and atraumatic.     Right Ear: External ear normal.     Left Ear: External ear normal.     Nose: Nose normal.  Eyes:     Conjunctiva/sclera: Conjunctivae normal.  Cardiovascular:     Rate and Rhythm: Normal rate and regular rhythm.     Heart sounds: No murmur heard. Pulmonary:     Effort: Pulmonary effort is normal. No respiratory distress.     Breath sounds: Normal breath sounds. No wheezing or rales.  Chest:     Chest wall: No tenderness.  Abdominal:     General: There is no distension.     Palpations: Abdomen is soft.     Tenderness: There is no abdominal tenderness.  Musculoskeletal:        General: No swelling.     Cervical back: Neck supple.  Skin:    General: Skin is warm and dry.     Capillary Refill: Capillary refill takes less than 2 seconds.     Coloration: Skin is not jaundiced.     Comments: Wounds on bilateral great toes  Neurological:     Mental Status: She is alert.     GCS: GCS eye subscore is 4. GCS verbal subscore is 5. GCS motor subscore is 6.     Cranial Nerves: No facial asymmetry.     Motor: Weakness (Global) present. No abnormal muscle tone.  Psychiatric:        Mood and Affect: Mood normal.        Behavior: Behavior normal.        Procedures  Procedures  ED Course / MDM    Medical Decision  Making Amount and/or Complexity of Data Reviewed Labs: ordered. Radiology: ordered. ECG/medicine tests: ordered.  Risk Prescription drug management.   On assessment, patient is awake.  She is able to very faintly tell me her name.  Her family member accompanies her at bedside.  Family member is able to provide further history: Patient is nonambulatory at baseline.  She seemed to be doing well yesterday.  She does have chronic diarrhea.  Family tries to give her plenty of fluids to keep her hydrated.  She does have history of recurrent UTIs.  She was recently hospitalized for this.  She was seen by outpatient providers yesterday, including wound care.  She underwent debridement of a wound on her right great toe.  Because of all the activity yesterday, she was fatigued.  This morning, she seemed to wake up in her normal state.  She did receive her morning medications.  She subsequently had fatigue and lethargy.  Patient does take Keppra daily.  She did receive her dose this morning.  No seizure activity was witnessed at home.  Per chart review, her most recent hospital admission was 2 weeks ago for encephalopathy with suspected cause of UTI.  She completed 6 days of Rocephin.  She was discharged on 10/16.  Initial lab work shows baseline anemia with a large leukocytosis with neutrophilia.  Initial lactate is normal.  Currently, vital signs are normal.  Urinalysis was not consistent with infection.  CT imaging was ordered to further evaluate for source of infection.  Additionally, patient does have wounds on her feet which may have underlying osteomyelitis.  Vancomycin was ordered.  While in the ED, she had very slight improvement in her mentation and was able to speak with her daughter who was at bedside.  Patient was admitted for further management.       Gloris Manchester, MD 05/18/23 2124

## 2023-05-18 NOTE — Hospital Course (Signed)
[name] is a 89 y.o. female with medical history significant for dementia, insulin-dependent T2DM, dermatomyositis (on Plaquenil and chronic prednisone 5 mg daily), PAD, history of syncope due to orthostasis and vasovagal episodes, history of seizure, hypothyroidism, macrocytic anemia who is admitted with acute encephalopathy due to multifocal right-sided pneumonia.

## 2023-05-18 NOTE — H&P (Signed)
History and Physical    [name] [name] ZOX:[id number] DOB: 1933/09/25 DOA: 05/18/2023  PCP: [name], MD  Patient coming from: Home  I have personally briefly reviewed patient's old medical records in [facility]  Chief Complaint: Altered mental status  HPI: [name] [name] is a 89 y.o. female with medical history significant for dementia, insulin-dependent T2DM, dermatomyositis (on Plaquenil and chronic prednisone 5 mg daily), PAD, history of syncope due to orthostasis and vasovagal episodes, history of seizure, hypothyroidism, macrocytic anemia who presented to the ED for evaluation of altered mental status.  History is limited from patient due to encephalopathy on background of dementia and is otherwise supplemented by EDP, chart review, and daughters.  Patient was recently admitted 04/30/2023-05/05/23 for acute encephalopathy secondary to Klebsiella UTI.  She was treated with 6 days of IV Rocephin.  Family states that patient was doing well postdischarge.  She has been following with wound care for chronic wounds to both of her feet and just underwent some removal of skin tissue yesterday.  Today patient was noted to have significantly diminished level of interaction.  She had not been complaining of anything.  Family has not noticed any shortness of breath, cough, emesis, or aspiration events.  No obvious fevers, chills, diaphoresis.  Family notes chronic loose stools which has not really changed from baseline.  Patient is chronically bed/wheelchair bound due to history of dermatomyositis.  ED Course  Labs/Imaging on admission: I have personally reviewed following labs and imaging studies.  Initial vitals showed BP 115/68, pulse 74, RR 18, temp 97.9 F, SpO2 96% on room air.  Labs show WBC 20.0, hemoglobin 10.1, platelets 310,000, sodium 135, potassium 4.3, bicarb 30, BUN 17, creatinine 0.92, serum glucose 184, LFTs within normal limits, lactic acid 1.9, BNP 125.8, CK19,  ammonia <10, TSH 1.472, free T41.19.  SARS-CoV-2, influenza, RSV PCR negative.  Blood cultures in process.  UA negative for UTI.  CT head without contrast negative for acute intracranial abnormality.  Portable chest x-ray shows multifocal pulmonary infiltrates, right greater than left.  CTA chest and CT abdomen/pelvis with contrast were negative for evidence of PE.  Multifocal pneumonia involving the right upper and lower lobes noted.  Circumferential urinary bladder wall thickening likely due to underdistention.  Status post cholecystectomy and hysterectomy.  Aortic atherosclerosis including 4 vessel coronary calcifications noted.  Patent bilateral common iliac artery stent seen.  Patient was given 1 L LR, IV vancomycin, ceftriaxone, doxycycline.  The hospitalist service was consulted to admit for further evaluation and management.  Review of Systems: All systems reviewed and are negative except as documented in history of present illness above.   Past Medical History:  Diagnosis Date   Arthritis    "knees, legs" (06/10/2016)   Ascites    Chronic kidney disease    "related to my diabetes"   Chronic lower back pain    Dementia without behavioral disturbance (HCC)    Diabetes mellitus without complication (HCC)    Eczema    GERD (gastroesophageal reflux disease)    High cholesterol    Hyperlipidemia    Hypertension    Hypothyroidism    OAB (overactive bladder)    Osteoporosis    Thyroid disease    Type II diabetes mellitus (HCC)    Urticaria     Past Surgical History:  Procedure Laterality Date   ABDOMINAL AORTOGRAM W/LOWER EXTREMITY N/A 02/08/2023   Procedure: ABDOMINAL AORTOGRAM W/LOWER EXTREMITY;  Surgeon: Chuck Hint, MD;  Location: [facility]  INVASIVE CV LAB;  Service: Vascular;  Laterality: N/A;   ABDOMINAL HYSTERECTOMY     KNEE ARTHROSCOPY     LAPAROTOMY N/A 12/03/2016   Procedure: EXPLORATORY LAPAROTOMY, LYSIS OF ADHESIONS;  Surgeon: Darnell Level, MD;  Location:  [facility] ORS;  Service: General;  Laterality: N/A;   LOOP RECORDER INSERTION N/A 08/23/2019   Procedure: LOOP RECORDER INSERTION;  Surgeon: Marinus Maw, MD;  Location: [facility] INVASIVE CV LAB;  Service: Cardiovascular;  Laterality: N/A;   PERIPHERAL VASCULAR INTERVENTION  02/08/2023   Procedure: PERIPHERAL VASCULAR INTERVENTION;  Surgeon: Chuck Hint, MD;  Location: [facility] INVASIVE CV LAB;  Service: Vascular;;   vocal cord polyps      Social History:  reports that she has never smoked. She quit smokeless tobacco use about 39 years ago.  Her smokeless tobacco use included chew. She reports that she does not drink alcohol and does not use drugs.  Allergies  Allergen Reactions   Nsaids Other (See Comments)    No NSAIDS due to kidney function- ESPECIALLY ibuprofen or Aleve   Biaxin [Clarithromycin] Hives   Statins Other (See Comments)    Muscle pain (myositis per daugher)   Bactrim [Sulfamethoxazole-Trimethoprim] Hives and Rash   Lisinopril Cough   Sulfa Antibiotics Hives, Rash and Other (See Comments)    NO sulfa-based meds!!     Family History  Problem Relation Age of Onset   Diabetes Mother    Hypertension Mother    Asthma Father    Diabetes Sister    Stomach cancer Sister    Diabetes Brother    Allergic rhinitis Neg Hx    Eczema Neg Hx    Urticaria Neg Hx      Prior to Admission medications   Medication Sig Start Date End Date Taking? Authorizing Provider  acetaminophen (TYLENOL) 650 MG CR tablet Take 650 mg by mouth every 8 (eight) hours as needed for pain.   Yes [provider]  BAYER LOW DOSE 81 MG EC tablet Take 81 mg by mouth in the morning. Swallow whole.   Yes [provider]  cetirizine (ZYRTEC) 10 MG tablet Take 10 mg by mouth 3 (three) times daily as needed for allergies or rhinitis.   Yes [provider]  Cholecalciferol (VITAMIN D-3 PO) Take 2,000 Units by mouth daily.   Yes [provider]  clopidogrel (PLAVIX) 75 MG tablet  Take 1 tablet (75 mg total) by mouth daily with breakfast. 02/09/23  Yes Baglia, Corrina, PA-C  CRANBERRY PO Take 1 tablet by mouth daily. Cystex Liquid: UTI Probiotic 1 tablespoon once a day   Yes [provider]  ezetimibe (ZETIA) 10 MG tablet Take 1 tablet (10 mg total) by mouth daily. Patient taking differently: Take 10 mg by mouth at bedtime. 02/09/23 02/09/24 Yes Chuck Hint, MD  famotidine (PEPCID) 20 MG tablet Take 20 mg by mouth at bedtime.   Yes [provider]  ferrous sulfate 220 (44 Fe) MG/5ML solution Take 330 mg by mouth 3 (three) times a week. 12/27/22  Yes [provider]  fluconazole (DIFLUCAN) 100 MG tablet Take 100 mg by mouth 3 (three) times a week. 04/11/23  Yes [provider]  folic acid (FOLVITE) 1 MG tablet Take 1 mg by mouth at bedtime.   Yes [provider]  insulin aspart (NOVOLOG FLEXPEN) 100 UNIT/ML FlexPen Inject 2-10 Units into the skin See admin instructions. Inject 2-10 units into the skin three times a day with meals, per sliding scale:  Breakfast: BGL 80-199 = 8 units; 200-299 = 9 units; 300 or greater = 10 units Lunch: BGL 80-199 = 5 units; 200-299 = 6 units; 300 or greater = 7 units Supper/evening meal: BGL 80-199 = 2 units; 200-299 = 3 units; 300 or greater = 4 units   Yes [provider]  insulin detemir (LEVEMIR FLEXTOUCH) 100 UNIT/ML FlexPen Inject 3-7 Units into the skin See admin instructions. Inject 7 units into the skin in the morning and 3 units at bedtime PRN   Yes [provider]  levETIRAcetam (KEPPRA) 500 MG tablet Take 500 mg by mouth in the morning and at bedtime. 01/11/23  Yes [provider]  methotrexate (RHEUMATREX) 2.5 MG tablet Take 1 tablet (2.5 mg total) by mouth once a week. Caution:Chemotherapy. Protect from light. Patient taking differently: Take 12.5 mg by mouth every Monday. Caution:Chemotherapy. Protect from light. 01/27/22  Yes Marinda Elk, MD   metoprolol tartrate (LOPRESSOR) 25 MG tablet Take 1 tablet (25 mg total) by mouth 2 (two) times daily. Patient taking differently: Take 12.5 mg by mouth as needed (if Systolic number is over 160 for over 2 hours). 01/20/22  Yes Marinda Elk, MD  midodrine (PROAMATINE) 2.5 MG tablet Take 1 tablet (2.5 mg total) by mouth 3 (three) times daily with meals. Patient taking differently: Take 0.5 tablets by mouth 3 (three) times daily with meals. 05/05/23 06/04/23 Yes Jerald Kief, MD  Multiple Vitamin (MULTIVITAMIN WITH MINERALS) TABS tablet Take 1 tablet by mouth daily with breakfast. OneaDay 50+ adult multivitamin   Yes [provider]  PLAQUENIL 200 MG tablet Take 200 mg by mouth at bedtime. 10/07/22  Yes [provider]  predniSONE (DELTASONE) 5 MG tablet Take 5 mg by mouth daily with breakfast.   Yes [provider]  SYNTHROID 100 MCG tablet Take 100 mcg by mouth daily before breakfast.   Yes [provider]  triamcinolone ointment (KENALOG) 0.1 % Apply 1 Application topically 2 (two) times daily as needed (skin irritation- affected areas). 07/16/22  Yes [provider]    Physical Exam: Vitals:   05/18/23 2045 05/18/23 2100 05/18/23 2140 05/18/23 2200  BP: (!) 123/50 (!) 157/71 (!) 152/62 (!) 160/58  Pulse: (!) 59 79 70 69  Resp: 15 18 13 13   Temp:      TempSrc:      SpO2: 96% 96% 97% 97%  Weight:      Height:       Constitutional: Elderly woman resting in bed, somnolent but will briefly arouse to voice and light physical stimuli.  NAD, calm, comfortable Eyes: EOMI, lids and conjunctivae normal ENMT: Mucous membranes are moist. Posterior pharynx clear of any exudate or lesions.Normal dentition.  Neck: normal, supple, no masses. Respiratory: clear to auscultation anteriorly. Normal respiratory effort. No accessory muscle use.  Cardiovascular: Regular rate and rhythm, no murmurs / rubs / gallops. No extremity edema. 2+ pedal  pulses. Abdomen: no tenderness, no masses palpated.  Musculoskeletal: no clubbing / cyanosis. No joint deformity upper and lower extremities. Good ROM, no contractures. Normal muscle tone.  Skin: Bunion medial left foot with appearance of overlying pressure sore.  Sore distal tip of right first toe without active discharge.  See pictures below Neurologic: Sensation intact. Strength equal bilaterally Psychiatric: Somnolent, briefly arouses to voice and light physical stimuli.  Nonverbal.      EKG: Personally reviewed. Sinus rhythm, rate 77, first-degree AV block.  Not significantly changed when compared to previous.  Assessment/Plan  Principal Problem:   Multifocal pneumonia   [name] [name] is a 89 y.o. female with medical history significant for dementia, insulin-dependent T2DM, dermatomyositis (on Plaquenil and chronic prednisone 5 mg daily), PAD, history of syncope due to orthostasis and vasovagal episodes, history of seizure, hypothyroidism, macrocytic anemia who is admitted with acute encephalopathy due to multifocal right-sided pneumonia.  Assessment and Plan: Acute metabolic encephalopathy secondary to multifocal right-sided pneumonia: CT suggestive of right upper and lower lobe pneumonia as likely cause of acute encephalopathy on background of dementia.  No reported history of aspiration.  She is at risk for opportunistic infections due to chronic steroid and immunosuppressant use. -Continue IV ceftriaxone and doxycycline -Follow blood cultures -Check strep pneumonia and Legionella urinary antigens -Currently saturating well on room air, supplemental O2 as needed  Chronic wounds bilateral great toes: Follows with wound care.  Does not appear to be overtly superinfected. -Consult to wound care -Follow-up left and right great toe x-rays  Insulin-dependent type 2 diabetes: Continue Levemir 5 units daily, SSI.  History of seizure: Continue Keppra.  Hypertension with history  of orthostatic hypotension: BP is stable.  On midodrine as an outpatient, can resume as necessary.  Hypothyroidism: Continue Synthroid.  Dermatomyositis: Chronic debility with bed/wheelchair bound status due to dermatomyositis. -Continue prednisone 5 mg daily -Holding Plaquenil and methotrexate for now  Peripheral artery disease: S/p angioplasty of bilateral common iliac artery stenosis in 01/2023. -Continue aspirin, Plavix, Zetia  Macrocytic anemia: Hemoglobin stable.  Dementia: Decrease in mentation from baseline in setting of multifocal pneumonia.  Continue delirium precautions.   DVT prophylaxis: enoxaparin (LOVENOX) injection 40 mg Start: 05/19/23 1000 Code Status:   Code Status: Do not attempt resuscitation (DNR) PRE-ARREST INTERVENTIONS DESIRED confirmed with patient's daughters on admission. Family Communication: Youngest daughter at bedside, older daughter on speaker phone Disposition Plan: From home, dispo pending clinical progress Consults called: None Severity of Illness: The appropriate patient status for this patient is INPATIENT. Inpatient status is judged to be reasonable and necessary in order to provide the required intensity of service to ensure the patient's safety. The patient's presenting symptoms, physical exam findings, and initial radiographic and laboratory data in the context of their chronic comorbidities is felt to place them at high risk for further clinical deterioration. Furthermore, it is not anticipated that the patient will be medically stable for discharge from the hospital within 2 midnights of admission.   * I certify that at the point of admission it is my clinical judgment that the patient will require inpatient hospital care spanning beyond 2 midnights from the point of admission due to high intensity of service, high risk for further deterioration and high frequency of surveillance required.Darreld Mclean MD [facility]  If 7PM-7AM,  please contact night-coverage [email or web address]  05/18/2023, 10:50 PM

## 2023-05-18 NOTE — Progress Notes (Signed)
ED Pharmacy Antibiotic Sign Off An antibiotic consult was received from an ED provider for vancomycin per pharmacy dosing for sepsis. A chart review was completed to assess appropriateness.   The following one time order(s) were placed per pharmacy consult:  vancomycin 1250 mg IV x 1 dose  Further antibiotic and/or antibiotic pharmacy consults should be ordered by the admitting provider if indicated.   Thank you for allowing pharmacy to be a part of this patient's care.    Stephenie Acres, PharmD PGY1 Pharmacy Resident 05/18/2023 8:48 PM

## 2023-05-18 NOTE — ED Notes (Signed)
ED TO INPATIENT HANDOFF REPORT  ED Nurse Name and Phone #: Grover Canavan 1912  S Name/Age/Gender [name] [name] 89 y.o. female Room/Bed: 007C/007C  Code Status   Code Status: Do not attempt resuscitation (DNR) PRE-ARREST INTERVENTIONS DESIRED  Home/SNF/Other Home A&Ox 0  Is this baseline? No   Triage Complete: Triage complete  Chief Complaint Multifocal pneumonia [J18.9]  Triage Note [facility] 0740am today, UTI 04/30/2023, pt usually speaks in full sentences, today has not spoken since 0740. Responds to pain.   144/64 100.80f 20r 100% 3L 90% RA CBG 243   Allergies Allergies  Allergen Reactions   Nsaids Other (See Comments)    No NSAIDS due to kidney function- ESPECIALLY ibuprofen or Aleve   Biaxin [Clarithromycin] Hives   Statins Other (See Comments)    Muscle pain (myositis per daugher)   Bactrim [Sulfamethoxazole-Trimethoprim] Hives and Rash   Lisinopril Cough   Sulfa Antibiotics Hives, Rash and Other (See Comments)    NO sulfa-based meds!!     Level of Care/Admitting Diagnosis ED Disposition     ED Disposition  Admit   Condition  --   Comment  Hospital Area: [facility] [[id number]]  Level of Care: Med-Surg [16]  May admit patient to [facility] or [facility] if equivalent level of care is available:: No  Covid Evaluation: Confirmed COVID Negative  Diagnosis: Multifocal pneumonia [[id number]]  Admitting Physician: Charlsie Quest [[id number]]  Attending Physician: Charlsie Quest [[id number]]  Certification:: I certify this patient will need inpatient services for at least 2 midnights  Expected Medical Readiness: 05/21/2023          B Medical/Surgery History Past Medical History:  Diagnosis Date   Arthritis    "knees, legs" (06/10/2016)   Ascites    Chronic kidney disease    "related to my diabetes"   Chronic lower back pain    Dementia without behavioral disturbance (HCC)    Diabetes mellitus without complication (HCC)    Eczema    GERD  (gastroesophageal reflux disease)    High cholesterol    Hyperlipidemia    Hypertension    Hypothyroidism    OAB (overactive bladder)    Osteoporosis    Thyroid disease    Type II diabetes mellitus (HCC)    Urticaria    Past Surgical History:  Procedure Laterality Date   ABDOMINAL AORTOGRAM W/LOWER EXTREMITY N/A 02/08/2023   Procedure: ABDOMINAL AORTOGRAM W/LOWER EXTREMITY;  Surgeon: Chuck Hint, MD;  Location: [facility] INVASIVE CV LAB;  Service: Vascular;  Laterality: N/A;   ABDOMINAL HYSTERECTOMY     KNEE ARTHROSCOPY     LAPAROTOMY N/A 12/03/2016   Procedure: EXPLORATORY LAPAROTOMY, LYSIS OF ADHESIONS;  Surgeon: Darnell Level, MD;  Location: [facility] ORS;  Service: General;  Laterality: N/A;   LOOP RECORDER INSERTION N/A 08/23/2019   Procedure: LOOP RECORDER INSERTION;  Surgeon: Marinus Maw, MD;  Location: [facility] INVASIVE CV LAB;  Service: Cardiovascular;  Laterality: N/A;   PERIPHERAL VASCULAR INTERVENTION  02/08/2023   Procedure: PERIPHERAL VASCULAR INTERVENTION;  Surgeon: Chuck Hint, MD;  Location: [facility] INVASIVE CV LAB;  Service: Vascular;;   vocal cord polyps       A IV Location/Drains/Wounds Patient Lines/Drains/Airways Status     Active Line/Drains/Airways     Name Placement date Placement time Site Days   Peripheral IV 05/18/23 20 G Right Antecubital 05/18/23  --  Antecubital  less than 1   Pressure Injury 10/02/22 Coccyx Medial Stage 1 -  Intact skin with  non-blanchable redness of a localized area usually over a bony prominence. 10/02/22  2055  -- 228   Pressure Injury 10/02/22 Heel Left;Right Stage 1 -  Intact skin with non-blanchable redness of a localized area usually over a bony prominence. Bilateral heel redness, prophylactic drsg applied, heels floating 10/02/22  2055  -- 228   Pressure Injury 10/02/22 Knee Anterior;Left Stage 2 -  Partial thickness loss of dermis presenting as a shallow open injury with a red, pink wound bed without slough. 2 open areas above  left knee 10/02/22  2055  -- 228   Pressure Injury 10/02/22 Toe (Comment  which one) Anterior;Right Stage 2 -  Partial thickness loss of dermis presenting as a shallow open injury with a red, pink wound bed without slough. wound right big anterior 10/02/22  2055  -- 228   Pressure Injury 10/02/22 Back Left;Upper Stage 1 -  Intact skin with non-blanchable redness of a localized area usually over a bony prominence. erythema to bony prominence of left shoulder blade 10/02/22  2055  -- 228   Pressure Injury 03/16/23 Coccyx Stage 1 -  Intact skin with non-blanchable redness of a localized area usually over a bony prominence. 03/16/23  2200  -- 63   Wound / Incision (Open or Dehisced) 09/10/22 Toe (Comment  which one) Anterior;Right sore on tip of big toe 09/10/22  1725  Toe (Comment  which one)  250   Wound / Incision (Open or Dehisced) 09/10/22 Toe (Comment  which one) Anterior;Left bunion on tip of big toe 09/10/22  1725  Toe (Comment  which one)  250   Wound / Incision (Open or Dehisced) 09/10/22 Foot Anterior;Left "bunion" per family, with open area 09/10/22  1725  Foot  250   Wound / Incision (Open or Dehisced) 09/10/22 Coccyx 09/10/22  1725  Coccyx  250   Wound / Incision (Open or Dehisced) 05/01/23 Diabetic ulcer Other (Comment) Anterior;Left;Medial halux valgus area/ bunion 05/01/23  1522  Other (Comment)  17            Intake/Output Last 24 hours  Intake/Output Summary (Last 24 hours) at 05/18/2023 2240 Last data filed at 05/18/2023 1800 Gross per 24 hour  Intake 1100 ml  Output --  Net 1100 ml    Labs/Imaging Results for orders placed or performed during the hospital encounter of 05/18/23 (from the past 48 hour(s))  Resp panel by RT-PCR (RSV, Flu A&B, Covid) Anterior Nasal Swab     Status: None   Collection Time: 05/18/23  3:43 PM   Specimen: Anterior Nasal Swab  Result Value Ref Range   SARS Coronavirus 2 by RT PCR NEGATIVE NEGATIVE   Influenza A by PCR NEGATIVE NEGATIVE    Influenza B by PCR NEGATIVE NEGATIVE    Comment: (NOTE) The Xpert Xpress SARS-CoV-2/FLU/RSV plus assay is intended as an aid in the diagnosis of influenza from Nasopharyngeal swab specimens and should not be used as a sole basis for treatment. Nasal washings and aspirates are unacceptable for Xpert Xpress SARS-CoV-2/FLU/RSV testing.  Fact Sheet for Patients: BloggerCourse.com  Fact Sheet for Healthcare Providers: SeriousBroker.it  This test is not yet approved or cleared by the [place] FDA and has been authorized for detection and/or diagnosis of SARS-CoV-2 by FDA under an Emergency Use Authorization (EUA). This EUA will remain in effect (meaning this test can be used) for the duration of the COVID-19 declaration under Section 564(b)(1) of the Act, 21 U.S.C. section 360bbb-3(b)(1), unless the authorization is terminated or  revoked.     Resp Syncytial Virus by PCR NEGATIVE NEGATIVE    Comment: (NOTE) Fact Sheet for Patients: BloggerCourse.com  Fact Sheet for Healthcare Providers: SeriousBroker.it  This test is not yet approved or cleared by the [place] FDA and has been authorized for detection and/or diagnosis of SARS-CoV-2 by FDA under an Emergency Use Authorization (EUA). This EUA will remain in effect (meaning this test can be used) for the duration of the COVID-19 declaration under Section 564(b)(1) of the Act, 21 U.S.C. section 360bbb-3(b)(1), unless the authorization is terminated or revoked.  Performed at [facility], [address] [place], [place] [address]   I-Stat Lactic Acid, ED     Status: None   Collection Time: 05/18/23  4:23 PM  Result Value Ref Range   Lactic Acid, Venous 1.9 0.5 - 1.9 mmol/L  Comprehensive metabolic panel     Status: Abnormal   Collection Time: 05/18/23  4:23 PM  Result Value Ref Range   Sodium 135 135 - 145 mmol/L    Potassium 4.3 3.5 - 5.1 mmol/L   Chloride 95 (L) 98 - 111 mmol/L   CO2 30 22 - 32 mmol/L   Glucose, Bld 184 (H) 70 - 99 mg/dL    Comment: Glucose reference range applies only to samples taken after fasting for at least 8 hours.   BUN 17 8 - 23 mg/dL   Creatinine, Ser 0.10 0.44 - 1.00 mg/dL   Calcium 27.2 8.9 - 53.6 mg/dL   Total Protein 6.9 6.5 - 8.1 g/dL   Albumin 3.2 (L) 3.5 - 5.0 g/dL   AST 35 15 - 41 U/L   ALT 37 0 - 44 U/L   Alkaline Phosphatase 79 38 - 126 U/L   Total Bilirubin 0.6 0.3 - 1.2 mg/dL   GFR, Estimated >64 >40 mL/min    Comment: (NOTE) Calculated using the CKD-EPI Creatinine Equation (2021)    Anion gap 10 5 - 15    Comment: Performed at [facility], [address], [place], [place] [address]  CBC with Differential     Status: Abnormal   Collection Time: 05/18/23  4:23 PM  Result Value Ref Range   WBC 20.0 (H) 4.0 - 10.5 K/uL   RBC 3.15 (L) 3.87 - 5.11 MIL/uL   Hemoglobin 10.1 (L) 12.0 - 15.0 g/dL   HCT 59.5 (L) 63.8 - 75.6 %   MCV 98.7 80.0 - 100.0 fL   MCH 32.1 26.0 - 34.0 pg   MCHC 32.5 30.0 - 36.0 g/dL   RDW 43.3 29.5 - 18.8 %   Platelets 310 150 - 400 K/uL   nRBC 0.0 0.0 - 0.2 %   Neutrophils Relative % 93 %   Neutro Abs 18.5 (H) 1.7 - 7.7 K/uL   Lymphocytes Relative 3 %   Lymphs Abs 0.7 0.7 - 4.0 K/uL   Monocytes Relative 3 %   Monocytes Absolute 0.6 0.1 - 1.0 K/uL   Eosinophils Relative 0 %   Eosinophils Absolute 0.0 0.0 - 0.5 K/uL   Basophils Relative 0 %   Basophils Absolute 0.0 0.0 - 0.1 K/uL   Immature Granulocytes 1 %   Abs Immature Granulocytes 0.14 (H) 0.00 - 0.07 K/uL    Comment: Performed at [facility], [address], [place], [place] [address]  Protime-INR     Status: None   Collection Time: 05/18/23  4:23 PM  Result Value Ref Range   Prothrombin Time 14.2 11.4 -  15.2 seconds   INR 1.1 0.8 - 1.2    Comment: (NOTE) INR goal varies based on device and disease states. Performed at [facility], [address], [place], [place] [address]   APTT     Status: None   Collection Time: 05/18/23  4:23 PM  Result Value Ref Range   aPTT 32 24 - 36 seconds    Comment: Performed at [facility], [address] [place], [place] [address]  I-Stat venous blood gas, ED ([facility])     Status: Abnormal   Collection Time: 05/18/23  4:23 PM  Result Value Ref Range   pH, Ven 7.357 7.25 - 7.43   pCO2, Ven 58.6 44 - 60 mmHg   pO2, Ven 44 32 - 45 mmHg   Bicarbonate 32.9 (H) 20.0 - 28.0 mmol/L   TCO2 35 (H) 22 - 32 mmol/L   O2 Saturation 77 %   Acid-Base Excess 6.0 (H) 0.0 - 2.0 mmol/L   Sodium 134 (L) 135 - 145 mmol/L   Potassium 4.4 3.5 - 5.1 mmol/L   Calcium, Ion 1.32 1.15 - 1.40 mmol/L   HCT 32.0 (L) 36.0 - 46.0 %   Hemoglobin 10.9 (L) 12.0 - 15.0 g/dL   Sample type VENOUS   CK     Status: Abnormal   Collection Time: 05/18/23  4:23 PM  Result Value Ref Range   Total CK 19 (L) 38 - 234 U/L    Comment: Performed at [facility], [address] [place], [place] [address]  T4, free     Status: Abnormal   Collection Time: 05/18/23  4:23 PM  Result Value Ref Range   Free T4 1.19 (H) 0.61 - 1.12 ng/dL    Comment: (NOTE) Biotin ingestion may interfere with free T4 tests. If the results are inconsistent with the TSH level, previous test results, or the clinical presentation, then consider biotin interference. If needed, order repeat testing after stopping biotin. Performed at [facility], [address], [place], [place] [address]   Brain natriuretic peptide     Status: Abnormal   Collection Time: 05/18/23  4:25 PM  Result Value Ref Range   B Natriuretic Peptide 125.8 (H) 0.0 - 100.0 pg/mL    Comment: Performed at [facility], [address] [place], [place] [address]  TSH     Status: None   Collection Time: 05/18/23  4:25 PM  Result Value Ref Range   TSH 1.472 0.350 - 4.500 uIU/mL    Comment: Performed by a 3rd Generation assay with a functional sensitivity of <=0.01  uIU/mL. Performed at [facility], [address], [place], [place] [address]   Ammonia     Status: None   Collection Time: 05/18/23  4:40 PM  Result Value Ref Range   Ammonia <10 9 - 35 umol/L    Comment: Performed at [facility], [address], [place], [place] [address]  Urinalysis, w/ Reflex to Culture (Infection Suspected) -Urine, Clean Catch     Status: Abnormal   Collection Time: 05/18/23  5:55 PM  Result Value Ref Range   Specimen Source URINE, CLEAN CATCH    Color, Urine YELLOW YELLOW   APPearance CLEAR CLEAR   Specific Gravity, Urine 1.011 1.005 - 1.030   pH 8.0 5.0 - 8.0   Glucose, UA NEGATIVE NEGATIVE mg/dL   Hgb urine dipstick NEGATIVE NEGATIVE   Bilirubin Urine NEGATIVE NEGATIVE  Ketones, ur NEGATIVE NEGATIVE mg/dL   Protein, ur NEGATIVE NEGATIVE mg/dL   Nitrite NEGATIVE NEGATIVE   Leukocytes,Ua NEGATIVE NEGATIVE   RBC / HPF 0-5 0 - 5 RBC/hpf   WBC, UA 0-5 0 - 5 WBC/hpf    Comment:        Reflex urine culture not performed if WBC <=10, OR if Squamous epithelial cells >5. If Squamous epithelial cells >5 suggest recollection.    Bacteria, UA RARE (A) NONE SEEN   Squamous Epithelial / HPF 0-5 0 - 5 /HPF   Mucus PRESENT     Comment: Performed at [facility], [address] [place], [place] [address]   CT Angio Chest PE W and/or Wo Contrast  Result Date: 05/18/2023 CLINICAL DATA:  Sepsis. Pulmonary embolism (PE) suspected, high prob; Sepsis EXAM: CT ANGIOGRAPHY CHEST CT ABDOMEN AND PELVIS WITH CONTRAST TECHNIQUE: Multidetector CT imaging of the chest was performed using the standard protocol during bolus administration of intravenous contrast. Multiplanar CT image reconstructions and MIPs were obtained to evaluate the vascular anatomy. Multidetector CT imaging of the abdomen and pelvis was performed using the standard protocol during bolus administration of intravenous contrast. RADIATION DOSE REDUCTION: This exam was performed according to the  departmental dose-optimization program which includes automated exposure control, adjustment of the mA and/or kV according to patient size and/or use of iterative reconstruction technique. CONTRAST:  75mL OMNIPAQUE IOHEXOL 350 MG/ML SOLN COMPARISON:  None Available. FINDINGS: CTA CHEST FINDINGS Cardiovascular: Satisfactory opacification of the pulmonary arteries to the segmental level. No evidence of pulmonary embolism. Normal heart size. No pericardial effusion. Normal heart size. No significant pericardial effusion. The thoracic aorta is normal in caliber. Atherosclerotic plaque. Atherosclerotic plaque of the thoracic aorta. Four-vessel coronary artery calcifications. Mediastinum/Nodes: No enlarged mediastinal, hilar, or axillary lymph nodes. Thyroid gland, trachea, and esophagus demonstrate no significant findings. Lungs/Pleura: Patchy right upper lobe consolidative and ground-glass airspace opacities (3:62) right lower lobe atelectasis. To a lesser extent similar finding within the right lower lobe (3:76). No focal consolidation. No pulmonary nodule. No pulmonary mass. No pleural effusion. No pneumothorax. Musculoskeletal: No chest wall abnormality. No suspicious lytic or blastic osseous lesions. No acute displaced fracture. Bilateral shoulder degenerative changes. T1-T2 endplate sclerosis. Review of the MIP images confirms the above findings. CT ABDOMEN and PELVIS FINDINGS Hepatobiliary: No focal liver abnormality. Status post cholecystectomy. No biliary dilatation. Pancreas: No focal lesion. Normal pancreatic contour. No surrounding inflammatory changes. No main pancreatic ductal dilatation. Spleen: Normal in size without focal abnormality. Adrenals/Urinary Tract: No adrenal nodule bilaterally. Bilateral kidneys enhance symmetrically. Fluid density lesion within left kidney likely represents simple renal cyst. Simple renal cysts, in the absence of clinically indicated signs/symptoms, require no independent  follow-up. Subcentimeter hypodensities are too small to characterize-no further follow-up indicated. No hydronephrosis. No hydroureter. Circumferential symmetric urinary bladder wall thickening. On delayed imaging, there is no urothelial wall thickening and there are no filling defects in the opacified portions of the bilateral collecting systems or ureters. Stomach/Bowel: Stomach is within normal limits. No evidence of bowel wall thickening or dilatation. The appendix is not definitely identified with no inflammatory changes in the right lower quadrant to suggest acute appendicitis. Vascular/Lymphatic: Patent bilateral common iliac artery stent. No abdominal aorta or iliac aneurysm. Severe atherosclerotic plaque of the aorta and its branches. No abdominal, pelvic, or inguinal lymphadenopathy. Reproductive: Status post hysterectomy. No adnexal masses. Other: No intraperitoneal free fluid. No intraperitoneal free gas. No organized fluid collection. Musculoskeletal: No abdominal wall hernia or  abnormality. No suspicious lytic or blastic osseous lesions. No acute displaced fracture. Multilevel degenerative changes of the spine. Review of the MIP images confirms the above findings. IMPRESSION: 1. No pulmonary embolus. 2. Multifocal pneumonia involving the right upper and lower lobes. Recommend follow-up CT in 3 months to evaluate for complete resolution. 3. Circumferential urinary bladder wall thickening likely due to under distension. Consider urinalysis for exclusion of superimposed infection. 4. Status post cholecystectomy and hysterectomy. 5. Aortic Atherosclerosis (ICD10-I70.0) including four-vessel coronary calcifications. Patent bilateral common iliac artery stent. Electronically Signed   By: Tish Frederickson M.D.   On: 05/18/2023 22:11   CT ABDOMEN PELVIS W CONTRAST  Result Date: 05/18/2023 CLINICAL DATA:  Sepsis. Pulmonary embolism (PE) suspected, high prob; Sepsis EXAM: CT ANGIOGRAPHY CHEST CT ABDOMEN AND  PELVIS WITH CONTRAST TECHNIQUE: Multidetector CT imaging of the chest was performed using the standard protocol during bolus administration of intravenous contrast. Multiplanar CT image reconstructions and MIPs were obtained to evaluate the vascular anatomy. Multidetector CT imaging of the abdomen and pelvis was performed using the standard protocol during bolus administration of intravenous contrast. RADIATION DOSE REDUCTION: This exam was performed according to the departmental dose-optimization program which includes automated exposure control, adjustment of the mA and/or kV according to patient size and/or use of iterative reconstruction technique. CONTRAST:  75mL OMNIPAQUE IOHEXOL 350 MG/ML SOLN COMPARISON:  None Available. FINDINGS: CTA CHEST FINDINGS Cardiovascular: Satisfactory opacification of the pulmonary arteries to the segmental level. No evidence of pulmonary embolism. Normal heart size. No pericardial effusion. Normal heart size. No significant pericardial effusion. The thoracic aorta is normal in caliber. Atherosclerotic plaque. Atherosclerotic plaque of the thoracic aorta. Four-vessel coronary artery calcifications. Mediastinum/Nodes: No enlarged mediastinal, hilar, or axillary lymph nodes. Thyroid gland, trachea, and esophagus demonstrate no significant findings. Lungs/Pleura: Patchy right upper lobe consolidative and ground-glass airspace opacities (3:62) right lower lobe atelectasis. To a lesser extent similar finding within the right lower lobe (3:76). No focal consolidation. No pulmonary nodule. No pulmonary mass. No pleural effusion. No pneumothorax. Musculoskeletal: No chest wall abnormality. No suspicious lytic or blastic osseous lesions. No acute displaced fracture. Bilateral shoulder degenerative changes. T1-T2 endplate sclerosis. Review of the MIP images confirms the above findings. CT ABDOMEN and PELVIS FINDINGS Hepatobiliary: No focal liver abnormality. Status post cholecystectomy. No  biliary dilatation. Pancreas: No focal lesion. Normal pancreatic contour. No surrounding inflammatory changes. No main pancreatic ductal dilatation. Spleen: Normal in size without focal abnormality. Adrenals/Urinary Tract: No adrenal nodule bilaterally. Bilateral kidneys enhance symmetrically. Fluid density lesion within left kidney likely represents simple renal cyst. Simple renal cysts, in the absence of clinically indicated signs/symptoms, require no independent follow-up. Subcentimeter hypodensities are too small to characterize-no further follow-up indicated. No hydronephrosis. No hydroureter. Circumferential symmetric urinary bladder wall thickening. On delayed imaging, there is no urothelial wall thickening and there are no filling defects in the opacified portions of the bilateral collecting systems or ureters. Stomach/Bowel: Stomach is within normal limits. No evidence of bowel wall thickening or dilatation. The appendix is not definitely identified with no inflammatory changes in the right lower quadrant to suggest acute appendicitis. Vascular/Lymphatic: Patent bilateral common iliac artery stent. No abdominal aorta or iliac aneurysm. Severe atherosclerotic plaque of the aorta and its branches. No abdominal, pelvic, or inguinal lymphadenopathy. Reproductive: Status post hysterectomy. No adnexal masses. Other: No intraperitoneal free fluid. No intraperitoneal free gas. No organized fluid collection. Musculoskeletal: No abdominal wall hernia or abnormality. No suspicious lytic or blastic osseous lesions. No acute displaced fracture. Multilevel  degenerative changes of the spine. Review of the MIP images confirms the above findings. IMPRESSION: 1. No pulmonary embolus. 2. Multifocal pneumonia involving the right upper and lower lobes. Recommend follow-up CT in 3 months to evaluate for complete resolution. 3. Circumferential urinary bladder wall thickening likely due to under distension. Consider urinalysis for  exclusion of superimposed infection. 4. Status post cholecystectomy and hysterectomy. 5. Aortic Atherosclerosis (ICD10-I70.0) including four-vessel coronary calcifications. Patent bilateral common iliac artery stent. Electronically Signed   By: Tish Frederickson M.D.   On: 05/18/2023 22:11   DG Chest Port 1 View  Result Date: 05/18/2023 CLINICAL DATA:  Sepsis EXAM: PORTABLE CHEST 1 VIEW COMPARISON:  05/01/2023 FINDINGS: Multifocal pulmonary infiltrates have developed within the right mid and lower lung zone, in keeping with multifocal pneumonia in the appropriate clinical setting. Sparse infiltrate also noted at the left lung base. The lungs are symmetrically well expanded. No pneumothorax or pleural effusion. Cardiac size within normal limits. Implanted loop recorder noted. No acute bone abnormality. IMPRESSION: 1. Multifocal pulmonary infiltrates, right greater than left, in keeping with multifocal pneumonia in the appropriate clinical setting. Electronically Signed   By: Helyn Numbers M.D.   On: 05/18/2023 20:08   CT Head Wo Contrast  Result Date: 05/18/2023 CLINICAL DATA:  Delirium EXAM: CT HEAD WITHOUT CONTRAST TECHNIQUE: Contiguous axial images were obtained from the base of the skull through the vertex without intravenous contrast. RADIATION DOSE REDUCTION: This exam was performed according to the departmental dose-optimization program which includes automated exposure control, adjustment of the mA and/or kV according to patient size and/or use of iterative reconstruction technique. COMPARISON:  None Available. FINDINGS: Brain: There is no mass, hemorrhage or extra-axial collection. There is generalized atrophy without lobar predilection. Hypodensity of the white matter is most commonly associated with chronic microvascular disease. Vascular: Atherosclerotic calcification of the internal carotid arteries at the skull base. No abnormal hyperdensity of the major intracranial arteries or dural venous  sinuses. Skull: The visualized skull base, calvarium and extracranial soft tissues are normal. Sinuses/Orbits: No fluid levels or advanced mucosal thickening of the visualized paranasal sinuses. No mastoid or middle ear effusion. Normal orbits. IMPRESSION: 1. No acute intracranial abnormality. 2. Generalized atrophy and findings of chronic microvascular disease. Electronically Signed   By: Deatra Robinson M.D.   On: 05/18/2023 18:59    Pending Labs Unresulted Labs (From admission, onward)     Start     Ordered   05/19/23 0500  CBC  Tomorrow morning,   R        05/18/23 2239   05/19/23 0500  Basic metabolic panel  Tomorrow morning,   R        05/18/23 2239   05/19/23 0500  Procalcitonin  Tomorrow morning,   R       References:    Procalcitonin Lower Respiratory Tract Infection AND Sepsis Procalcitonin Algorithm   05/18/23 2239   05/18/23 1542  Blood Culture (routine x 2)  (Undifferentiated presentation (screening labs and basic nursing orders))  BLOOD CULTURE X 2,   STAT      05/18/23 1542            Vitals/Pain Today's Vitals   05/18/23 2045 05/18/23 2100 05/18/23 2140 05/18/23 2200  BP: (!) 123/50 (!) 157/71 (!) 152/62 (!) 160/58  Pulse: (!) 59 79 70 69  Resp: 15 18 13 13   Temp:      TempSrc:      SpO2: 96% 96% 97% 97%  Weight:  Height:        Isolation Precautions No active isolations  Medications Medications  vancomycin (VANCOREADY) IVPB 1250 mg/250 mL (1,250 mg Intravenous New Bag/Given 05/18/23 2143)  doxycycline (VIBRAMYCIN) 100 mg in dextrose 5 % 250 mL IVPB (100 mg Intravenous New Bag/Given 05/18/23 2144)  enoxaparin (LOVENOX) injection 40 mg (has no administration in time range)  acetaminophen (TYLENOL) tablet 650 mg (has no administration in time range)    Or  acetaminophen (TYLENOL) suppository 650 mg (has no administration in time range)  ondansetron (ZOFRAN) tablet 4 mg (has no administration in time range)    Or  ondansetron (ZOFRAN) injection 4 mg (has  no administration in time range)  senna-docusate (Senokot-S) tablet 1 tablet (has no administration in time range)  cefTRIAXone (ROCEPHIN) 2 g in sodium chloride 0.9 % 100 mL IVPB (has no administration in time range)  lactated ringers bolus 1,000 mL (0 mLs Intravenous Stopped 05/18/23 1800)  cefTRIAXone (ROCEPHIN) 1 g in sodium chloride 0.9 % 100 mL IVPB (0 g Intravenous Stopped 05/18/23 1652)  iohexol (OMNIPAQUE) 350 MG/ML injection 75 mL (75 mLs Intravenous Contrast Given 05/18/23 2139)    Mobility non-ambulatory     Focused Assessments Neuro Assessment Handoff:  Swallow screen pass?  N/a         Neuro Assessment: Exceptions to [facility] Neuro Checks:      Has TPA been given? No If patient is a Neuro Trauma and patient is going to OR before floor call report to 4N Charge nurse: [phone] or [phone]   R Recommendations: See Admitting Provider Note  Report given to:   Additional Notes:

## 2023-05-18 NOTE — ED Triage Notes (Signed)
[facility] 0740am today, UTI 04/30/2023, pt usually speaks in full sentences, today has not spoken since 0740. Responds to pain.   144/64 100.70f 20r 100% 3L 90% RA CBG 243

## 2023-05-18 NOTE — Progress Notes (Signed)
applied to wound bed Bathing/ Shower/ Hygiene: Other Bathing/Shower/Hygiene Orders/Instructions: - Bathe feet with soap and water as tolerated. (please do not soak the feet) Off-Loading: Other: - Float feet with a pillow under the calf while in bed Home Health: New wound care orders this week; continue Home Health for wound care. May utilize formulary equivalent dressing for wound treatment orders unless otherwise specified. - Collagen and bordered foam to left foot wound. Collagen with gauze to right great toe wound. home health to change x2 a week. Other Home Health Orders/Instructions: - [facility] WOUND #3: - T Great Wound Laterality: Right, Distal oe Cleanser: Soap and Water 2 x Per Week/30 Days Discharge Instructions: May shower and wash wound with dial antibacterial soap and water prior to dressing change. Cleanser: Wound Cleanser 2 x Per Week/30 Days Discharge Instructions: Cleanse the wound with wound cleanser prior to applying a clean dressing using  gauze sponges, not tissue or cotton balls. Prim Dressing: FIBRACOL Plus Dressing, 2x2 in (collagen) 2 x Per Week/30 Days ary Discharge Instructions: Moisten collagen with saline or hydrogel Secured With: Conforming Stretch Gauze Bandage, Sterile 2x75 (in/in) 2 x Per Week/30 Days Discharge Instructions: Secure with stretch gauze as directed. Secured With: 31M Medipore Scientist, research (life sciences) Surgical T 2x10 (in/yd) 2 x Per Week/30 Days ape Discharge Instructions: Secure with tape as directed. WOUND #4: - Foot Wound Laterality: Left, Medial Cleanser: Soap and Water 2 x Per Week/30 Days Discharge Instructions: May shower and wash wound with dial antibacterial soap and water prior to dressing change. Cleanser: Wound Cleanser 2 x Per Week/30 Days Discharge Instructions: Cleanse the wound with wound cleanser prior to applying a clean dressing using gauze sponges, not tissue or cotton balls. Prim Dressing: FIBRACOL Plus Dressing, 2x2 in (collagen) 2 x Per Week/30 Days ary Discharge Instructions: Moisten collagen with saline or hydrogel Secondary Dressing: Zetuvit Plus Silicone Border Dressing 4x4 (in/in) 2 x Per Week/30 Days Discharge Instructions: Apply silicone border over primary dressing as directed. [name], [name] ([id number]) [id number] Page 7 of 9 1. Changed to collagen 2. These actually look quite good I did a surface debridement of the first toe removing slough this does not probe to bone. Electronic Signature(s) Signed: 05/18/2023 8:53:04 AM By: Baltazar Najjar MD Entered By: Baltazar Najjar on 05/17/2023 12:50:18 -------------------------------------------------------------------------------- HxROS Details Patient Name: Date of Service: [name], [name] M. 05/17/2023 2:30 PM Medical Record Number: [id number] Patient Account Number: [id number] Date of Birth/Sex: Treating RN: Dec 08, 1933 (89 y.o. [name] Primary Care Provider: Hillard Danker Other  Clinician: Referring Provider: Treating Provider/Extender: Farrell Ours Weeks in Treatment: 22 Information Obtained From Caregiver Chart Ear/Nose/Mouth/Throat Medical History: Past Medical History Notes: dysphagia Cardiovascular Medical History: Positive for: Hypertension; Hypotension - orthostatic; Peripheral Arterial Disease; Peripheral Venous Disease Past Medical History Notes: hyperlipidemia, Gastrointestinal Medical History: Positive for: Colitis Past Medical History Notes: GERD viral gastroenteritis small bower obstruction 2018 Endocrine Medical History: Positive for: Type II Diabetes Past Medical History Notes: hypothyroidism, Time with diabetes: 40 years Treated with: Insulin Blood sugar tested every day: Yes Tested : 3 x day Blood sugar testing results: Breakfast: 200 Genitourinary Medical History: Past Medical History Notes: CKD II urinary retention Integumentary (Skin) Medical History: Past Medical History Notes: eczema Musculoskeletal Medical History: Past Medical History NotesLOIS, [name] ([id number]) [id number] Page 8 of 9 degenerative joint disease osteoporosis Neurologic Medical History: Positive for: Dementia; Seizure Disorder Past Medical History Notes: delirium encephalopathy Immunizations Pneumococcal Vaccine: Received Pneumococcal Vaccination: Yes Received Pneumococcal Vaccination On or  After 60th Birthday: Yes Implantable Devices Yes Hospitalization / Surgery History Type of Hospitalization/Surgery loop recorder insertion 2021 2018 laparotomy abdominal hysterectomy knee arthroscopy vocal cord polyps syncope 9/24 8/24 COVID PNA Family and Social History Cancer: Yes - Siblings; Diabetes: Yes - Mother,Siblings; Hypertension: Yes - Mother; Lung Disease: Yes - Father; Never smoker; Marital Status - Widowed; Alcohol Use: Never; Drug Use: No History; Caffeine Use: Rarely - coffee;  Financial Concerns: No; Food, Clothing or Shelter Needs: No; Support System Lacking: No; Transportation Concerns: No Psychologist, prison and probation services) Signed: 05/17/2023 6:15:51 PM By: Shawn Stall RN, BSN Signed: 05/18/2023 8:53:04 AM By: Baltazar Najjar MD Entered By: Shawn Stall on 05/17/2023 11:55:45 -------------------------------------------------------------------------------- SuperBill Details Patient Name: Date of Service: [name] [name]. 05/17/2023 Medical Record Number: [id number] Patient Account Number: [id number] Date of Birth/Sex: Treating RN: [birth year] (89 y.o. [name], [name] [name] Primary Care Provider: Hillard Danker Other Clinician: Referring Provider: Treating Provider/Extender: Farrell Ours Weeks in Treatment: 22 Diagnosis Coding ICD-10 Codes Code Description [phone] Non-pressure chronic ulcer of other part of right foot with other specified severity L97.528 Non-pressure chronic ulcer of other part of left foot with other specified severity E11.621 Type 2 diabetes mellitus with foot ulcer F03.90 Unspecified dementia, unspecified severity, without behavioral disturbance, psychotic disturbance, mood disturbance, and anxiety I73.9 Peripheral vascular disease, unspecified Facility Procedures : CANIYA, MITSCH Code: [id number] ESSIE M ([id number] Description: [phone] - DEBRIDE WOUND 1ST 20 SQ CM OR < ICD-10 Diagnosis Description L97.518 Non-pressure chronic ulcer of other part of right foot with other specified sever ) [phone] Modifier: [email or web address] Quantity: 1 227.pdf Page 9 of 9 Physician Procedures : CPT4 Code Description Modifier [id number] - WC PHYS DEBR WO ANESTH 20 SQ CM ICD-10 Diagnosis Description L97.518 Non-pressure chronic ulcer of other part of right foot with other specified severity Quantity: 1 Electronic Signature(s) Signed: 05/18/2023 8:53:04 AM By: Baltazar Najjar MD Entered By: Baltazar Najjar on 05/17/2023 12:50:25  applied to wound bed Bathing/ Shower/ Hygiene: Other Bathing/Shower/Hygiene Orders/Instructions: - Bathe feet with soap and water as tolerated. (please do not soak the feet) Off-Loading: Other: - Float feet with a pillow under the calf while in bed Home Health: New wound care orders this week; continue Home Health for wound care. May utilize formulary equivalent dressing for wound treatment orders unless otherwise specified. - Collagen and bordered foam to left foot wound. Collagen with gauze to right great toe wound. home health to change x2 a week. Other Home Health Orders/Instructions: - [facility] WOUND #3: - T Great Wound Laterality: Right, Distal oe Cleanser: Soap and Water 2 x Per Week/30 Days Discharge Instructions: May shower and wash wound with dial antibacterial soap and water prior to dressing change. Cleanser: Wound Cleanser 2 x Per Week/30 Days Discharge Instructions: Cleanse the wound with wound cleanser prior to applying a clean dressing using  gauze sponges, not tissue or cotton balls. Prim Dressing: FIBRACOL Plus Dressing, 2x2 in (collagen) 2 x Per Week/30 Days ary Discharge Instructions: Moisten collagen with saline or hydrogel Secured With: Conforming Stretch Gauze Bandage, Sterile 2x75 (in/in) 2 x Per Week/30 Days Discharge Instructions: Secure with stretch gauze as directed. Secured With: 31M Medipore Scientist, research (life sciences) Surgical T 2x10 (in/yd) 2 x Per Week/30 Days ape Discharge Instructions: Secure with tape as directed. WOUND #4: - Foot Wound Laterality: Left, Medial Cleanser: Soap and Water 2 x Per Week/30 Days Discharge Instructions: May shower and wash wound with dial antibacterial soap and water prior to dressing change. Cleanser: Wound Cleanser 2 x Per Week/30 Days Discharge Instructions: Cleanse the wound with wound cleanser prior to applying a clean dressing using gauze sponges, not tissue or cotton balls. Prim Dressing: FIBRACOL Plus Dressing, 2x2 in (collagen) 2 x Per Week/30 Days ary Discharge Instructions: Moisten collagen with saline or hydrogel Secondary Dressing: Zetuvit Plus Silicone Border Dressing 4x4 (in/in) 2 x Per Week/30 Days Discharge Instructions: Apply silicone border over primary dressing as directed. [name], [name] ([id number]) [id number] Page 7 of 9 1. Changed to collagen 2. These actually look quite good I did a surface debridement of the first toe removing slough this does not probe to bone. Electronic Signature(s) Signed: 05/18/2023 8:53:04 AM By: Baltazar Najjar MD Entered By: Baltazar Najjar on 05/17/2023 12:50:18 -------------------------------------------------------------------------------- HxROS Details Patient Name: Date of Service: [name], [name] M. 05/17/2023 2:30 PM Medical Record Number: [id number] Patient Account Number: [id number] Date of Birth/Sex: Treating RN: Dec 08, 1933 (89 y.o. [name] Primary Care Provider: Hillard Danker Other  Clinician: Referring Provider: Treating Provider/Extender: Farrell Ours Weeks in Treatment: 22 Information Obtained From Caregiver Chart Ear/Nose/Mouth/Throat Medical History: Past Medical History Notes: dysphagia Cardiovascular Medical History: Positive for: Hypertension; Hypotension - orthostatic; Peripheral Arterial Disease; Peripheral Venous Disease Past Medical History Notes: hyperlipidemia, Gastrointestinal Medical History: Positive for: Colitis Past Medical History Notes: GERD viral gastroenteritis small bower obstruction 2018 Endocrine Medical History: Positive for: Type II Diabetes Past Medical History Notes: hypothyroidism, Time with diabetes: 40 years Treated with: Insulin Blood sugar tested every day: Yes Tested : 3 x day Blood sugar testing results: Breakfast: 200 Genitourinary Medical History: Past Medical History Notes: CKD II urinary retention Integumentary (Skin) Medical History: Past Medical History Notes: eczema Musculoskeletal Medical History: Past Medical History NotesLOIS, [name] ([id number]) [id number] Page 8 of 9 degenerative joint disease osteoporosis Neurologic Medical History: Positive for: Dementia; Seizure Disorder Past Medical History Notes: delirium encephalopathy Immunizations Pneumococcal Vaccine: Received Pneumococcal Vaccination: Yes Received Pneumococcal Vaccination On or  After 60th Birthday: Yes Implantable Devices Yes Hospitalization / Surgery History Type of Hospitalization/Surgery loop recorder insertion 2021 2018 laparotomy abdominal hysterectomy knee arthroscopy vocal cord polyps syncope 9/24 8/24 COVID PNA Family and Social History Cancer: Yes - Siblings; Diabetes: Yes - Mother,Siblings; Hypertension: Yes - Mother; Lung Disease: Yes - Father; Never smoker; Marital Status - Widowed; Alcohol Use: Never; Drug Use: No History; Caffeine Use: Rarely - coffee;  Financial Concerns: No; Food, Clothing or Shelter Needs: No; Support System Lacking: No; Transportation Concerns: No Psychologist, prison and probation services) Signed: 05/17/2023 6:15:51 PM By: Shawn Stall RN, BSN Signed: 05/18/2023 8:53:04 AM By: Baltazar Najjar MD Entered By: Shawn Stall on 05/17/2023 11:55:45 -------------------------------------------------------------------------------- SuperBill Details Patient Name: Date of Service: [name] [name]. 05/17/2023 Medical Record Number: [id number] Patient Account Number: [id number] Date of Birth/Sex: Treating RN: [birth year] (89 y.o. [name], [name] [name] Primary Care Provider: Hillard Danker Other Clinician: Referring Provider: Treating Provider/Extender: Farrell Ours Weeks in Treatment: 22 Diagnosis Coding ICD-10 Codes Code Description [phone] Non-pressure chronic ulcer of other part of right foot with other specified severity L97.528 Non-pressure chronic ulcer of other part of left foot with other specified severity E11.621 Type 2 diabetes mellitus with foot ulcer F03.90 Unspecified dementia, unspecified severity, without behavioral disturbance, psychotic disturbance, mood disturbance, and anxiety I73.9 Peripheral vascular disease, unspecified Facility Procedures : CANIYA, MITSCH Code: [id number] ESSIE M ([id number] Description: [phone] - DEBRIDE WOUND 1ST 20 SQ CM OR < ICD-10 Diagnosis Description L97.518 Non-pressure chronic ulcer of other part of right foot with other specified sever ) [phone] Modifier: [email or web address] Quantity: 1 227.pdf Page 9 of 9 Physician Procedures : CPT4 Code Description Modifier [id number] - WC PHYS DEBR WO ANESTH 20 SQ CM ICD-10 Diagnosis Description L97.518 Non-pressure chronic ulcer of other part of right foot with other specified severity Quantity: 1 Electronic Signature(s) Signed: 05/18/2023 8:53:04 AM By: Baltazar Najjar MD Entered By: Baltazar Najjar on 05/17/2023 12:50:25  with a pillow under the calf while in bed Home Health New wound care orders this week; continue Home Health for wound care. May utilize formulary equivalent dressing for wound treatment orders unless otherwise specified. - Collagen and bordered foam to left foot wound. Collagen with gauze to right great toe wound. home health to change x2 a week. Other Home Health Orders/Instructions: - [facility]ound Treatment Wound #3 - T Great oe Wound Laterality: Right, Distal Cleanser: Soap and Water 2 x Per Week/30 Days Discharge Instructions: May shower and wash wound with dial antibacterial soap and water prior to dressing change. Cleanser: Wound Cleanser 2 x Per Week/30 Days Discharge Instructions: Cleanse the wound with wound cleanser prior to applying a clean dressing using gauze sponges, not tissue or cotton balls. Prim Dressing: FIBRACOL Plus Dressing, 2x2 in (collagen) 2 x Per Week/30 Days ary Discharge Instructions: Moisten collagen with saline or hydrogel Secured With: Conforming Stretch Gauze Bandage, Sterile 2x75 (in/in) 2 x Per Week/30 Days Discharge Instructions: Secure with stretch gauze as directed. Secured With: 85M Medipore Scientist, research (life sciences) Surgical T 2x10 (in/yd) 2 x Per Week/30 Days ape Discharge Instructions: Secure with tape as directed. Wound #4 - Foot Wound Laterality: Left, Medial Cleanser: Soap and Water 2 x Per Week/30 Days Discharge Instructions: May  shower and wash wound with dial antibacterial soap and water prior to dressing change. Cleanser: Wound Cleanser 2 x Per Week/30 Days Discharge Instructions: Cleanse the wound with wound cleanser prior to applying a clean dressing using gauze sponges, not tissue or cotton balls. Prim Dressing: FIBRACOL Plus Dressing, 2x2 in (collagen) 2 x Per Week/30 Days ary Discharge Instructions: Moisten collagen with saline or hydrogel Secondary Dressing: Zetuvit Plus Silicone Border Dressing 4x4 (in/in) 2 x Per Week/30 Days Discharge Instructions: Apply silicone border over primary dressing as directed. Electronic Signature(s) Signed: 05/17/2023 6:15:51 PM By: Shawn Stall RN, BSN Signed: 05/18/2023 8:53:04 AM By: Baltazar Najjar MD Entered By: Shawn Stall on 05/17/2023 12:02:00 Dianah Field ([id number]) [id number] Page 4 of 9 -------------------------------------------------------------------------------- Problem List Details Patient Name: Date of Service: [name] [name], [name] [name] 05/17/2023 2:30 PM Medical Record Number: [id number] Patient Account Number: [id number] Date of Birth/Sex: Treating RN: [birth year] (89 y.o. [name] Primary Care Provider: Hillard Danker Other Clinician: Referring Provider: Treating Provider/Extender: Farrell Ours Weeks in Treatment: 22 Active Problems ICD-10 Encounter Code Description Active Date MDM Diagnosis L97.518 Non-pressure chronic ulcer of other part of right foot with other specified 12/10/2022 No Yes severity L97.528 Non-pressure chronic ulcer of other part of left foot with other specified 12/10/2022 No Yes severity E11.621 Type 2 diabetes mellitus with foot ulcer 12/10/2022 No Yes F03.90 Unspecified dementia, unspecified severity, without behavioral disturbance, 12/10/2022 No Yes psychotic disturbance, mood disturbance, and anxiety I73.9 Peripheral vascular disease, unspecified 12/10/2022 No Yes Inactive  Problems Resolved Problems Electronic Signature(s) Signed: 05/17/2023 6:15:51 PM By: Shawn Stall RN, BSN Signed: 05/18/2023 8:53:04 AM By: Baltazar Najjar MD Entered By: Shawn Stall on 05/17/2023 11:54:05 -------------------------------------------------------------------------------- Progress Note Details Patient Name: Date of Service: [name], Pincus Sanes. 05/17/2023 2:30 PM Medical Record Number: [id number] Patient Account Number: [id number] Date of Birth/Sex: Treating RN: 01/11/1934 (89 y.o. F) Primary Care Provider: Hillard Danker Other Clinician: Referring Provider: Treating Provider/Extender: Farrell Ours Weeks in Treatment: [address], [place] M ([id number]) [id number] Page 5 of 9 History of Present Illness (HPI) 12/10/2022 Ms. Frankye Falla is an 89 year old female with a past medical history of PAD, dementia, uncontrolled type 2 diabetes on insulin  SHAVAUGHN, WENTWOR[place] ([id number]) [id number] Page 1 of 9 Visit Report for 05/17/2023 Debridement Details Patient Name: Date of Service: HA SHAWNTEE, [place] 05/17/2023 2:30 PM Medical Record Number: [id number] Patient Account Number: [id number] Date of Birth/Sex: Treating RN: Sep 25, 1933 (89 y.o. [name] Primary Care Provider: Hillard Danker Other Clinician: Referring Provider: Treating Provider/Extender: Farrell Ours Weeks in Treatment: 22 Debridement Performed for Assessment: Wound #3 Right,Distal T Great oe Performed By: Physician Maxwell Caul., MD The following information was scribed by: Shawn Stall The information was scribed for: Baltazar Najjar Debridement Type: Debridement Severity of Tissue Pre Debridement: Fat layer exposed Level of Consciousness (Pre-procedure): Awake and Alert Pre-procedure Verification/Time Out Yes - 14:45 Taken: Start Time: 14:46 Pain Control: Lidocaine 4% T opical Solution Percent of Wound Bed Debrided: 100% T Area Debrided (cm): otal 0.09 Tissue and other material debrided: Non-Viable, Slough, Slough Level: Non-Viable Tissue Debridement Description: Selective/Open Wound Instrument: Curette Bleeding: Minimum Hemostasis Achieved: Pressure End Time: 14:56 Procedural Pain: 0 Post Procedural Pain: 0 Response to Treatment: Procedure was tolerated well Level of Consciousness (Post- Awake and Alert procedure): Post Debridement Measurements of Total Wound Length: (cm) 0.3 Width: (cm) 0.4 Depth: (cm) 0.1 Volume: (cm) 0.009 Character of Wound/Ulcer Post Debridement: Improved Severity of Tissue Post Debridement: Fat layer exposed Post Procedure Diagnosis Same as Pre-procedure Electronic Signature(s) Signed: 05/17/2023 6:15:51 PM By: Shawn Stall RN, BSN Signed: 05/18/2023 8:53:04 AM By: Baltazar Najjar MD Entered By: Shawn Stall on 05/17/2023  11:57:03 -------------------------------------------------------------------------------- HPI Details Patient Name: Date of Service: [name], Samoria M. 05/17/2023 2:30 PM Medical Record Number: [id number] Patient Account Number: [id number] Date of Birth/Sex: Treating RN: 1934/01/04 (89 y.o. F) Primary Care Provider: Hillard Danker Other Clinician: Referring Provider: Treating Provider/Extender: Farrell Ours [place], [place] ([id number]) [id number] Page 2 of 9 Weeks in Treatment: 22 History of Present Illness HPI Description: 12/10/2022 Ms. Ebonee Oleary is an 89 year old female with a past medical history of PAD, dementia, uncontrolled type 2 diabetes on insulin that presents the clinic for a 1 year history of wounds to her feet bilaterally. Daughter is present and helps provide the history. How the wounds have started is unclear. She has been using surgical shoes to offload the wounds. She has been using Santyl to the wound beds. She has slight erythema that daughter states is new around the periwound on the right and left great toe. She has a history of PAD and is followed with vein and vascular for this last seeing them on 10/2019. She had no wounds at that time but ABIs showed moderate right infrapopliteal artery occlusive disease and moderate multisegmental left lower extremity arterial occlusive disease. Patient has chronic pain to the wound sites. 6/4; send patient with wounds at the tip of her left and right first toes and the left medial MTP. We have been using Santyl to the wounds. 6/13; patient presents for follow-up. Patient has been using Santyl and Hydrofera Blue. She has no issues or complaints. She is scheduled to see vein and vascular on 7/10. 7/12; patient presents for follow-up. She has been using Santyl and Hydrofera Blue to the wound beds. She saw vein and vascular on 7/10 and She had repeat ABIs that showed monophasic waveforms  throughout the right and left lower extremity. Left worse than right. She had dampened waveforms on the right and left toes. plan is for angiogram. She denies signs of infection. Left great toe wound is healed. 9/9; Patient presents for follow-up. She had an  with a pillow under the calf while in bed Home Health New wound care orders this week; continue Home Health for wound care. May utilize formulary equivalent dressing for wound treatment orders unless otherwise specified. - Collagen and bordered foam to left foot wound. Collagen with gauze to right great toe wound. home health to change x2 a week. Other Home Health Orders/Instructions: - [facility]ound Treatment Wound #3 - T Great oe Wound Laterality: Right, Distal Cleanser: Soap and Water 2 x Per Week/30 Days Discharge Instructions: May shower and wash wound with dial antibacterial soap and water prior to dressing change. Cleanser: Wound Cleanser 2 x Per Week/30 Days Discharge Instructions: Cleanse the wound with wound cleanser prior to applying a clean dressing using gauze sponges, not tissue or cotton balls. Prim Dressing: FIBRACOL Plus Dressing, 2x2 in (collagen) 2 x Per Week/30 Days ary Discharge Instructions: Moisten collagen with saline or hydrogel Secured With: Conforming Stretch Gauze Bandage, Sterile 2x75 (in/in) 2 x Per Week/30 Days Discharge Instructions: Secure with stretch gauze as directed. Secured With: 85M Medipore Scientist, research (life sciences) Surgical T 2x10 (in/yd) 2 x Per Week/30 Days ape Discharge Instructions: Secure with tape as directed. Wound #4 - Foot Wound Laterality: Left, Medial Cleanser: Soap and Water 2 x Per Week/30 Days Discharge Instructions: May  shower and wash wound with dial antibacterial soap and water prior to dressing change. Cleanser: Wound Cleanser 2 x Per Week/30 Days Discharge Instructions: Cleanse the wound with wound cleanser prior to applying a clean dressing using gauze sponges, not tissue or cotton balls. Prim Dressing: FIBRACOL Plus Dressing, 2x2 in (collagen) 2 x Per Week/30 Days ary Discharge Instructions: Moisten collagen with saline or hydrogel Secondary Dressing: Zetuvit Plus Silicone Border Dressing 4x4 (in/in) 2 x Per Week/30 Days Discharge Instructions: Apply silicone border over primary dressing as directed. Electronic Signature(s) Signed: 05/17/2023 6:15:51 PM By: Shawn Stall RN, BSN Signed: 05/18/2023 8:53:04 AM By: Baltazar Najjar MD Entered By: Shawn Stall on 05/17/2023 12:02:00 Dianah Field ([id number]) [id number] Page 4 of 9 -------------------------------------------------------------------------------- Problem List Details Patient Name: Date of Service: [name] [name], [name] [name] 05/17/2023 2:30 PM Medical Record Number: [id number] Patient Account Number: [id number] Date of Birth/Sex: Treating RN: [birth year] (89 y.o. [name] Primary Care Provider: Hillard Danker Other Clinician: Referring Provider: Treating Provider/Extender: Farrell Ours Weeks in Treatment: 22 Active Problems ICD-10 Encounter Code Description Active Date MDM Diagnosis L97.518 Non-pressure chronic ulcer of other part of right foot with other specified 12/10/2022 No Yes severity L97.528 Non-pressure chronic ulcer of other part of left foot with other specified 12/10/2022 No Yes severity E11.621 Type 2 diabetes mellitus with foot ulcer 12/10/2022 No Yes F03.90 Unspecified dementia, unspecified severity, without behavioral disturbance, 12/10/2022 No Yes psychotic disturbance, mood disturbance, and anxiety I73.9 Peripheral vascular disease, unspecified 12/10/2022 No Yes Inactive  Problems Resolved Problems Electronic Signature(s) Signed: 05/17/2023 6:15:51 PM By: Shawn Stall RN, BSN Signed: 05/18/2023 8:53:04 AM By: Baltazar Najjar MD Entered By: Shawn Stall on 05/17/2023 11:54:05 -------------------------------------------------------------------------------- Progress Note Details Patient Name: Date of Service: [name], Pincus Sanes. 05/17/2023 2:30 PM Medical Record Number: [id number] Patient Account Number: [id number] Date of Birth/Sex: Treating RN: 01/11/1934 (89 y.o. F) Primary Care Provider: Hillard Danker Other Clinician: Referring Provider: Treating Provider/Extender: Farrell Ours Weeks in Treatment: [address], [place] M ([id number]) [id number] Page 5 of 9 History of Present Illness (HPI) 12/10/2022 Ms. Frankye Falla is an 89 year old female with a past medical history of PAD, dementia, uncontrolled type 2 diabetes on insulin

## 2023-05-19 DIAGNOSIS — J189 Pneumonia, unspecified organism: Secondary | ICD-10-CM | POA: Diagnosis not present

## 2023-05-19 LAB — BASIC METABOLIC PANEL
Anion gap: 10 (ref 5–15)
BUN: 12 mg/dL (ref 8–23)
CO2: 25 mmol/L (ref 22–32)
Calcium: 9.7 mg/dL (ref 8.9–10.3)
Chloride: 100 mmol/L (ref 98–111)
Creatinine, Ser: 0.7 mg/dL (ref 0.44–1.00)
GFR, Estimated: >60 mL/min (ref >60)
Glucose, Bld: 66 mg/dL — ABNORMAL LOW (ref 70–99)
Potassium: 3.6 mmol/L (ref 3.5–5.1)
Sodium: 135 mmol/L (ref 135–145)

## 2023-05-19 LAB — CBC
HCT: 26.2 % — ABNORMAL LOW (ref 36.0–46.0)
Hemoglobin: 8.6 g/dL — ABNORMAL LOW (ref 12.0–15.0)
MCH: 32.1 pg (ref 26.0–34.0)
MCHC: 32.8 g/dL (ref 30.0–36.0)
MCV: 97.8 fL (ref 80.0–100.0)
Platelets: 266 10*3/uL (ref 150–400)
RBC: 2.68 MIL/uL — ABNORMAL LOW (ref 3.87–5.11)
RDW: 15 % (ref 11.5–15.5)
WBC: 13.2 10*3/uL — ABNORMAL HIGH (ref 4.0–10.5)
nRBC: 0 % (ref 0.0–0.2)

## 2023-05-19 LAB — GLUCOSE, CAPILLARY
Glucose-Capillary: 212 mg/dL — ABNORMAL HIGH (ref 70–99)
Glucose-Capillary: 241 mg/dL — ABNORMAL HIGH (ref 70–99)
Glucose-Capillary: 303 mg/dL — ABNORMAL HIGH (ref 70–99)
Glucose-Capillary: 319 mg/dL — ABNORMAL HIGH (ref 70–99)
Glucose-Capillary: 320 mg/dL — ABNORMAL HIGH (ref 70–99)
Glucose-Capillary: 64 mg/dL — ABNORMAL LOW (ref 70–99)
Glucose-Capillary: 79 mg/dL (ref 70–99)
Glucose-Capillary: 92 mg/dL (ref 70–99)

## 2023-05-19 LAB — STREP PNEUMONIAE URINARY ANTIGEN: Strep Pneumo Urinary Antigen: NEGATIVE

## 2023-05-19 LAB — PROCALCITONIN: Procalcitonin: 1.01 ng/mL

## 2023-05-19 MED ORDER — MIDODRINE HCL 5 MG PO TABS
5.0000 mg | ORAL_TABLET | Freq: Three times a day (TID) | ORAL | Status: DC
  Administered 2023-05-19 – 2023-05-20 (×5): 5 mg via ORAL
  Filled 2023-05-19 (×6): qty 1

## 2023-05-19 MED ORDER — DOXYCYCLINE HYCLATE 100 MG PO TABS
100.0000 mg | ORAL_TABLET | Freq: Two times a day (BID) | ORAL | Status: DC
  Administered 2023-05-19 – 2023-05-24 (×8): 100 mg via ORAL
  Filled 2023-05-19 (×8): qty 1

## 2023-05-19 MED ORDER — INSULIN GLARGINE-YFGN 100 UNIT/ML ~~LOC~~ SOLN
5.0000 [IU] | Freq: Every day | SUBCUTANEOUS | Status: DC
  Administered 2023-05-19 – 2023-05-24 (×5): 5 [IU] via SUBCUTANEOUS
  Filled 2023-05-19 (×6): qty 0.05

## 2023-05-19 MED ORDER — MIDODRINE HCL 5 MG PO TABS: 5.0000 mg | ORAL_TABLET | Freq: Three times a day (TID) | ORAL | Status: DC

## 2023-05-19 MED ORDER — DEXTROSE 50 % IV SOLN
12.5000 g | INTRAVENOUS | Status: AC
  Filled 2023-05-19: qty 50

## 2023-05-19 NOTE — Progress Notes (Signed)
PROGRESS NOTE    [name] [name]  GNF:[id number] DOB: [birth year] DOA: 05/18/2023 PCP: [name], MD   Brief Narrative:  89 y.o. female with medical history significant for dementia, insulin-dependent T2DM, dermatomyositis (on Plaquenil and chronic prednisone 5 mg daily), PAD, history of syncope due to orthostasis and vasovagal episodes, history of seizure, hypothyroidism, macrocytic anemia, recent hospitalization from 1011 24-10 1624 for acute encephalopathy secondary to Klebsiella UTI treated with IV antibiotics presented with altered mental status.  On presentation, WBC was 20, lactic acid of 1.9.  COVID-19/influenza/RSV PCR negative.  UA negative for UTI.  Portable chest x-ray showed multifocal pulmonary infiltrates, right greater than left.  CTA chest and CT abdomen/pelvis with contrast were negative for PE but showed multifocal pneumonia involving the right upper and lower lobes.  She was started on IV fluids and antibiotics.  Assessment & Plan:   Multifocal right-sided pneumonia -Possibly has a component of aspiration as well in the background of encephalopathy and dementia. -Continue Rocephin and doxycycline.  Follow cultures.  Oxygen supplementation if needed.  Currently on room air.  SLP eval.  Acute metabolic encephalopathy Dementia -Presented with worsening of mental status possibly due to pneumonia.  Patient has baseline dementia.  Monitor mental status.  Fall precautions.  Delirium precautions.  PT eval.  Dermatomyositis Chronic debility with bed/wheelchair bound -Continue prednisone.  Plaquenil and methotrexate on hold for now.  Outpatient follow-up with rheumatology  Chronic bilateral great toes wounds -Follow wound care consult recommendations.  X-rays of left and right great toes did not show any acute abnormality  Diabetes mellitus type 2 with hypoglycemia -DC long-acting insulin.  Patient has had episodes of hypoglycemia.  Encourage oral intake.  Continue CBGs with  SSI  Hypertension with history of orthostatic hypotension -Blood pressure currently on the higher side.  Daughter adamant about resuming midodrine.  History of seizure -Continue Keppra  Peripheral artery disease -Status post angioplasty of bilateral common iliac artery stenosis in 01/2023.  Continue aspirin, Plavix, Zetia  Leukocytosis -Improving.  Monitor  Anemia of chronic disease/normocytic anemia -From chronic illnesses.  Hemoglobin stable.  Monitor intermittently  Hypothyroidism -Continue levothyroxine  Goals of care -Patient is a DNR.  Palliative care had followed with the patient as an outpatient in the past but is not currently following anymore.  Continue antibiotics IV for the next few days and await response: If no improvement, will consult palliative care  DVT prophylaxis: Lovenox Code Status: DNR Family Communication: Daughter at bedside Disposition Plan: Status is: Inpatient Remains inpatient appropriate because: Of severity of illness    Consultants: None  Procedures: None  Antimicrobials: Rocephin and doxycycline from 05/18/2023 onwards   Subjective: Patient seen and examined at bedside.  Awake but a poor historian.  No fever, vomiting, seizures reported.  Daughter at bedside.  Objective: Vitals:   05/18/23 2200 05/18/23 2339 05/19/23 0339 05/19/23 0759  BP: (!) 160/58 (!) 165/74 (!) 150/67 (!) 166/63  Pulse: 69 66 (!) 58 64  Resp: 13 16 18    Temp:  98 F (36.7 C) (!) 97.4 F (36.3 C) (!) 97.5 F (36.4 C)  TempSrc:  Oral Oral   SpO2: 97% 94% 99% 100%  Weight:  46.9 kg    Height:  5\' 7"  (1.702 m)      Intake/Output Summary (Last 24 hours) at 05/19/2023 1013 Last data filed at 05/19/2023 0346 Gross per 24 hour  Intake 1746.66 ml  Output --  Net 1746.66 ml   Filed Weights   05/18/23 2028  05/18/23 2339  Weight: 54 kg 46.9 kg    Examination:  General exam: Appears calm and comfortable.  On room air.  Looks chronically ill and  deconditioned. Respiratory system: Bilateral decreased breath sounds at bases with some scattered crackles Cardiovascular system: S1 & S2 heard, Rate controlled Gastrointestinal system: Abdomen is nondistended, soft and nontender. Normal bowel sounds heard. Extremities: No cyanosis, clubbing, edema  Central nervous system: Awake, extremely slow to respond, poor historian.  Confused.  No focal neurological deficits. Moving extremities Skin: No ecchymosis/lesions Psychiatry: Flat affect.  Not agitated.   Data Reviewed: I have personally reviewed following labs and imaging studies  CBC: Recent Labs  Lab 05/18/23 1623 05/19/23 0651  WBC 20.0* 13.2*  NEUTROABS 18.5*  --   HGB 10.1*  10.9* 8.6*  HCT 31.1*  32.0* 26.2*  MCV 98.7 97.8  PLT 310 266   Basic Metabolic Panel: Recent Labs  Lab 05/18/23 1623 05/19/23 0651  NA 135  134* 135  K 4.3  4.4 3.6  CL 95* 100  CO2 30 25  GLUCOSE 184* 66*  BUN 17 12  CREATININE 0.92 0.70  CALCIUM 10.3 9.7   GFR: Estimated Creatinine Clearance: 36.7 mL/min (by C-G formula based on SCr of 0.7 mg/dL). Liver Function Tests: Recent Labs  Lab 05/18/23 1623  AST 35  ALT 37  ALKPHOS 79  BILITOT 0.6  PROT 6.9  ALBUMIN 3.2*   No results for input(s): "LIPASE", "AMYLASE" in the last 168 hours. Recent Labs  Lab 05/18/23 1640  AMMONIA <10   Coagulation Profile: Recent Labs  Lab 05/18/23 1623  INR 1.1   Cardiac Enzymes: Recent Labs  Lab 05/18/23 1623  CKTOTAL 19*   BNP (last 3 results) No results for input(s): "PROBNP" in the last 8760 hours. HbA1C: No results for input(s): "HGBA1C" in the last 72 hours. CBG: Recent Labs  Lab 05/18/23 2256 05/19/23 0342 05/19/23 0759 05/19/23 0833  GLUCAP 111* 92 64* 79   Lipid Profile: No results for input(s): "CHOL", "HDL", "LDLCALC", "TRIG", "CHOLHDL", "LDLDIRECT" in the last 72 hours. Thyroid Function Tests: Recent Labs    05/18/23 1623 05/18/23 1625  TSH  --  1.472  FREET4  1.19*  --    Anemia Panel: No results for input(s): "VITAMINB12", "FOLATE", "FERRITIN", "TIBC", "IRON", "RETICCTPCT" in the last 72 hours. Sepsis Labs: Recent Labs  Lab 05/18/23 1623  LATICACIDVEN 1.9    Recent Results (from the past 240 hour(s))  Resp panel by RT-PCR (RSV, Flu A&B, Covid) Anterior Nasal Swab     Status: None   Collection Time: 05/18/23  3:43 PM   Specimen: Anterior Nasal Swab  Result Value Ref Range Status   SARS Coronavirus 2 by RT PCR NEGATIVE NEGATIVE Final   Influenza A by PCR NEGATIVE NEGATIVE Final   Influenza B by PCR NEGATIVE NEGATIVE Final    Comment: (NOTE) The Xpert Xpress SARS-CoV-2/FLU/RSV plus assay is intended as an aid in the diagnosis of influenza from Nasopharyngeal swab specimens and should not be used as a sole basis for treatment. Nasal washings and aspirates are unacceptable for Xpert Xpress SARS-CoV-2/FLU/RSV testing.  Fact Sheet for Patients: BloggerCourse.com  Fact Sheet for Healthcare Providers: SeriousBroker.it  This test is not yet approved or cleared by the [place] FDA and has been authorized for detection and/or diagnosis of SARS-CoV-2 by FDA under an Emergency Use Authorization (EUA). This EUA will remain in effect (meaning this test can be used) for the duration of the COVID-19 declaration under Section  564(b)(1) of the Act, 21 U.S.C. section 360bbb-3(b)(1), unless the authorization is terminated or revoked.     Resp Syncytial Virus by PCR NEGATIVE NEGATIVE Final    Comment: (NOTE) Fact Sheet for Patients: BloggerCourse.com  Fact Sheet for Healthcare Providers: SeriousBroker.it  This test is not yet approved or cleared by the [place] FDA and has been authorized for detection and/or diagnosis of SARS-CoV-2 by FDA under an Emergency Use Authorization (EUA). This EUA will remain in effect (meaning this test can  be used) for the duration of the COVID-19 declaration under Section 564(b)(1) of the Act, 21 U.S.C. section 360bbb-3(b)(1), unless the authorization is terminated or revoked.  Performed at [facility], [address], [place], [place] [address]   Blood Culture (routine x 2)     Status: None (Preliminary result)   Collection Time: 05/18/23  3:47 PM   Specimen: BLOOD  Result Value Ref Range Status   Specimen Description BLOOD SITE NOT SPECIFIED  Final   Special Requests   Final    BOTTLES DRAWN AEROBIC AND ANAEROBIC Blood Culture adequate volume   Culture   Final    NO GROWTH < 24 HOURS Performed at [facility], [address], [place], [place] [address]    Report Status PENDING  Incomplete  Blood Culture (routine x 2)     Status: None (Preliminary result)   Collection Time: 05/18/23  4:26 PM   Specimen: BLOOD  Result Value Ref Range Status   Specimen Description BLOOD SITE NOT SPECIFIED  Final   Special Requests   Final    BOTTLES DRAWN AEROBIC AND ANAEROBIC Blood Culture adequate volume   Culture   Final    NO GROWTH < 24 HOURS Performed at [facility], [address] [place], [place] [address]    Report Status PENDING  Incomplete         Radiology Studies: DG Toe Great Right  Result Date: 05/19/2023 CLINICAL DATA:  Toe wounds EXAM: RIGHT GREAT TOE COMPARISON:  None Available. FINDINGS: No fracture or malalignment. Ulceration at the tip of the first digit. No underlying osseous erosive change. No soft tissue emphysema. Hallux valgus deformity at the first MTP joint with degenerative changes. Vascular calcifications. IMPRESSION: Ulceration at the tip of the first digit. No radiographic evidence for acute osseous abnormality Electronically Signed   By: Jasmine Pang M.D.   On: 05/19/2023 00:36   DG Toe Great Left  Result Date: 05/19/2023 CLINICAL DATA:  Wound EXAM: LEFT GREAT TOE COMPARISON:  None Available. FINDINGS: There is soft tissue swelling of  the first toe. There are punctate calcific densities in the soft tissues along the plantar surface of the distal first phalanx. There is no acute fracture or cortical erosion. There is hallux valgus and mild degenerative change of the first metatarsophalangeal joint. Peripheral vascular calcifications are noted. IMPRESSION: 1. Soft tissue swelling of the first toe. No acute fracture or cortical erosion. 2. Punctate calcific densities in the soft tissues along the plantar surface of the distal first phalanx. Please correlate clinically. Electronically Signed   By: Darliss Cheney M.D.   On: 05/19/2023 00:35   CT Angio Chest PE W and/or Wo Contrast  Result Date: 05/18/2023 CLINICAL DATA:  Sepsis. Pulmonary embolism (PE) suspected, high prob; Sepsis EXAM: CT ANGIOGRAPHY CHEST CT ABDOMEN AND PELVIS WITH CONTRAST TECHNIQUE: Multidetector CT imaging of the chest was performed using the standard protocol during bolus administration of intravenous contrast. Multiplanar CT image reconstructions  and MIPs were obtained to evaluate the vascular anatomy. Multidetector CT imaging of the abdomen and pelvis was performed using the standard protocol during bolus administration of intravenous contrast. RADIATION DOSE REDUCTION: This exam was performed according to the departmental dose-optimization program which includes automated exposure control, adjustment of the mA and/or kV according to patient size and/or use of iterative reconstruction technique. CONTRAST:  75mL OMNIPAQUE IOHEXOL 350 MG/ML SOLN COMPARISON:  None Available. FINDINGS: CTA CHEST FINDINGS Cardiovascular: Satisfactory opacification of the pulmonary arteries to the segmental level. No evidence of pulmonary embolism. Normal heart size. No pericardial effusion. Normal heart size. No significant pericardial effusion. The thoracic aorta is normal in caliber. Atherosclerotic plaque. Atherosclerotic plaque of the thoracic aorta. Four-vessel coronary artery  calcifications. Mediastinum/Nodes: No enlarged mediastinal, hilar, or axillary lymph nodes. Thyroid gland, trachea, and esophagus demonstrate no significant findings. Lungs/Pleura: Patchy right upper lobe consolidative and ground-glass airspace opacities (3:62) right lower lobe atelectasis. To a lesser extent similar finding within the right lower lobe (3:76). No focal consolidation. No pulmonary nodule. No pulmonary mass. No pleural effusion. No pneumothorax. Musculoskeletal: No chest wall abnormality. No suspicious lytic or blastic osseous lesions. No acute displaced fracture. Bilateral shoulder degenerative changes. T1-T2 endplate sclerosis. Review of the MIP images confirms the above findings. CT ABDOMEN and PELVIS FINDINGS Hepatobiliary: No focal liver abnormality. Status post cholecystectomy. No biliary dilatation. Pancreas: No focal lesion. Normal pancreatic contour. No surrounding inflammatory changes. No main pancreatic ductal dilatation. Spleen: Normal in size without focal abnormality. Adrenals/Urinary Tract: No adrenal nodule bilaterally. Bilateral kidneys enhance symmetrically. Fluid density lesion within left kidney likely represents simple renal cyst. Simple renal cysts, in the absence of clinically indicated signs/symptoms, require no independent follow-up. Subcentimeter hypodensities are too small to characterize-no further follow-up indicated. No hydronephrosis. No hydroureter. Circumferential symmetric urinary bladder wall thickening. On delayed imaging, there is no urothelial wall thickening and there are no filling defects in the opacified portions of the bilateral collecting systems or ureters. Stomach/Bowel: Stomach is within normal limits. No evidence of bowel wall thickening or dilatation. The appendix is not definitely identified with no inflammatory changes in the right lower quadrant to suggest acute appendicitis. Vascular/Lymphatic: Patent bilateral common iliac artery stent. No  abdominal aorta or iliac aneurysm. Severe atherosclerotic plaque of the aorta and its branches. No abdominal, pelvic, or inguinal lymphadenopathy. Reproductive: Status post hysterectomy. No adnexal masses. Other: No intraperitoneal free fluid. No intraperitoneal free gas. No organized fluid collection. Musculoskeletal: No abdominal wall hernia or abnormality. No suspicious lytic or blastic osseous lesions. No acute displaced fracture. Multilevel degenerative changes of the spine. Review of the MIP images confirms the above findings. IMPRESSION: 1. No pulmonary embolus. 2. Multifocal pneumonia involving the right upper and lower lobes. Recommend follow-up CT in 3 months to evaluate for complete resolution. 3. Circumferential urinary bladder wall thickening likely due to under distension. Consider urinalysis for exclusion of superimposed infection. 4. Status post cholecystectomy and hysterectomy. 5. Aortic Atherosclerosis (ICD10-I70.0) including four-vessel coronary calcifications. Patent bilateral common iliac artery stent. Electronically Signed   By: Tish Frederickson M.D.   On: 05/18/2023 22:11   CT ABDOMEN PELVIS W CONTRAST  Result Date: 05/18/2023 CLINICAL DATA:  Sepsis. Pulmonary embolism (PE) suspected, high prob; Sepsis EXAM: CT ANGIOGRAPHY CHEST CT ABDOMEN AND PELVIS WITH CONTRAST TECHNIQUE: Multidetector CT imaging of the chest was performed using the standard protocol during bolus administration of intravenous contrast. Multiplanar CT image reconstructions and MIPs were obtained to evaluate the vascular anatomy. Multidetector CT imaging of  the abdomen and pelvis was performed using the standard protocol during bolus administration of intravenous contrast. RADIATION DOSE REDUCTION: This exam was performed according to the departmental dose-optimization program which includes automated exposure control, adjustment of the mA and/or kV according to patient size and/or use of iterative reconstruction  technique. CONTRAST:  75mL OMNIPAQUE IOHEXOL 350 MG/ML SOLN COMPARISON:  None Available. FINDINGS: CTA CHEST FINDINGS Cardiovascular: Satisfactory opacification of the pulmonary arteries to the segmental level. No evidence of pulmonary embolism. Normal heart size. No pericardial effusion. Normal heart size. No significant pericardial effusion. The thoracic aorta is normal in caliber. Atherosclerotic plaque. Atherosclerotic plaque of the thoracic aorta. Four-vessel coronary artery calcifications. Mediastinum/Nodes: No enlarged mediastinal, hilar, or axillary lymph nodes. Thyroid gland, trachea, and esophagus demonstrate no significant findings. Lungs/Pleura: Patchy right upper lobe consolidative and ground-glass airspace opacities (3:62) right lower lobe atelectasis. To a lesser extent similar finding within the right lower lobe (3:76). No focal consolidation. No pulmonary nodule. No pulmonary mass. No pleural effusion. No pneumothorax. Musculoskeletal: No chest wall abnormality. No suspicious lytic or blastic osseous lesions. No acute displaced fracture. Bilateral shoulder degenerative changes. T1-T2 endplate sclerosis. Review of the MIP images confirms the above findings. CT ABDOMEN and PELVIS FINDINGS Hepatobiliary: No focal liver abnormality. Status post cholecystectomy. No biliary dilatation. Pancreas: No focal lesion. Normal pancreatic contour. No surrounding inflammatory changes. No main pancreatic ductal dilatation. Spleen: Normal in size without focal abnormality. Adrenals/Urinary Tract: No adrenal nodule bilaterally. Bilateral kidneys enhance symmetrically. Fluid density lesion within left kidney likely represents simple renal cyst. Simple renal cysts, in the absence of clinically indicated signs/symptoms, require no independent follow-up. Subcentimeter hypodensities are too small to characterize-no further follow-up indicated. No hydronephrosis. No hydroureter. Circumferential symmetric urinary bladder  wall thickening. On delayed imaging, there is no urothelial wall thickening and there are no filling defects in the opacified portions of the bilateral collecting systems or ureters. Stomach/Bowel: Stomach is within normal limits. No evidence of bowel wall thickening or dilatation. The appendix is not definitely identified with no inflammatory changes in the right lower quadrant to suggest acute appendicitis. Vascular/Lymphatic: Patent bilateral common iliac artery stent. No abdominal aorta or iliac aneurysm. Severe atherosclerotic plaque of the aorta and its branches. No abdominal, pelvic, or inguinal lymphadenopathy. Reproductive: Status post hysterectomy. No adnexal masses. Other: No intraperitoneal free fluid. No intraperitoneal free gas. No organized fluid collection. Musculoskeletal: No abdominal wall hernia or abnormality. No suspicious lytic or blastic osseous lesions. No acute displaced fracture. Multilevel degenerative changes of the spine. Review of the MIP images confirms the above findings. IMPRESSION: 1. No pulmonary embolus. 2. Multifocal pneumonia involving the right upper and lower lobes. Recommend follow-up CT in 3 months to evaluate for complete resolution. 3. Circumferential urinary bladder wall thickening likely due to under distension. Consider urinalysis for exclusion of superimposed infection. 4. Status post cholecystectomy and hysterectomy. 5. Aortic Atherosclerosis (ICD10-I70.0) including four-vessel coronary calcifications. Patent bilateral common iliac artery stent. Electronically Signed   By: Tish Frederickson M.D.   On: 05/18/2023 22:11   DG Chest Port 1 View  Result Date: 05/18/2023 CLINICAL DATA:  Sepsis EXAM: PORTABLE CHEST 1 VIEW COMPARISON:  05/01/2023 FINDINGS: Multifocal pulmonary infiltrates have developed within the right mid and lower lung zone, in keeping with multifocal pneumonia in the appropriate clinical setting. Sparse infiltrate also noted at the left lung base. The  lungs are symmetrically well expanded. No pneumothorax or pleural effusion. Cardiac size within normal limits. Implanted loop recorder noted. No acute bone abnormality.  IMPRESSION: 1. Multifocal pulmonary infiltrates, right greater than left, in keeping with multifocal pneumonia in the appropriate clinical setting. Electronically Signed   By: Helyn Numbers M.D.   On: 05/18/2023 20:08   CT Head Wo Contrast  Result Date: 05/18/2023 CLINICAL DATA:  Delirium EXAM: CT HEAD WITHOUT CONTRAST TECHNIQUE: Contiguous axial images were obtained from the base of the skull through the vertex without intravenous contrast. RADIATION DOSE REDUCTION: This exam was performed according to the departmental dose-optimization program which includes automated exposure control, adjustment of the mA and/or kV according to patient size and/or use of iterative reconstruction technique. COMPARISON:  None Available. FINDINGS: Brain: There is no mass, hemorrhage or extra-axial collection. There is generalized atrophy without lobar predilection. Hypodensity of the white matter is most commonly associated with chronic microvascular disease. Vascular: Atherosclerotic calcification of the internal carotid arteries at the skull base. No abnormal hyperdensity of the major intracranial arteries or dural venous sinuses. Skull: The visualized skull base, calvarium and extracranial soft tissues are normal. Sinuses/Orbits: No fluid levels or advanced mucosal thickening of the visualized paranasal sinuses. No mastoid or middle ear effusion. Normal orbits. IMPRESSION: 1. No acute intracranial abnormality. 2. Generalized atrophy and findings of chronic microvascular disease. Electronically Signed   By: Deatra Robinson M.D.   On: 05/18/2023 18:59        Scheduled Meds:  aspirin EC  81 mg Oral q AM   clopidogrel  75 mg Oral Q breakfast   dextrose  12.5 g Intravenous STAT   doxycycline  100 mg Oral BID   enoxaparin (LOVENOX) injection  40 mg  Subcutaneous Q24H   ezetimibe  10 mg Oral QHS   famotidine  20 mg Oral QHS   ferrous sulfate  300 mg Oral Once per day on Monday Wednesday Friday   folic acid  1 mg Oral QHS   insulin aspart  0-5 Units Subcutaneous QHS   insulin aspart  0-9 Units Subcutaneous TID WC   insulin detemir  5 Units Subcutaneous Daily   levETIRAcetam  500 mg Oral BID   levothyroxine  100 mcg Oral Q0600   midodrine  5 mg Oral TID WC   predniSONE  5 mg Oral Q breakfast   Continuous Infusions:  cefTRIAXone (ROCEPHIN)  IV            Glade Lloyd, MD [facility] 05/19/2023, 10:13 AM

## 2023-05-19 NOTE — Plan of Care (Signed)

## 2023-05-19 NOTE — Progress Notes (Signed)
PHARMACIST - PHYSICIAN COMMUNICATION DR:   [name] CONCERNING: Antibiotic IV to Oral Route Change Policy  RECOMMENDATION: This patient is receiving doxycycline by the intravenous route.  Based on criteria approved by the Pharmacy and Therapeutics Committee, the antibiotic(s) is/are being converted to the equivalent oral dose form(s).   DESCRIPTION: These criteria include: Patient being treated for a respiratory tract infection, urinary tract infection, cellulitis or clostridium difficile associated diarrhea if on metronidazole The patient is not neutropenic and does not exhibit a GI malabsorption state The patient is eating (either orally or via tube) and/or has been taking other orally administered medications for a least 24 hours The patient is improving clinically and has a Tmax < 100.5  If you have questions about this conversion, please contact the Pharmacy Department  []   [phone] )  [facility] []   [phone] )  [facility] [x]   [phone] )  [facility] []   [phone] )  [facility] []   [phone] )  [facility]

## 2023-05-19 NOTE — Evaluation (Signed)
Clinical/Bedside Swallow Evaluation Patient Details  Name: [name] [name] MRN: [id number] Date of Birth: 28-Jul-1933  Today's Date: 05/19/2023 Time: SLP Start Time (ACUTE ONLY): 0920 SLP Stop Time (ACUTE ONLY): 0958 SLP Time Calculation (min) (ACUTE ONLY): 38 min  Past Medical History:  Past Medical History:  Diagnosis Date   Arthritis    "knees, legs" (06/10/2016)   Ascites    Chronic kidney disease    "related to my diabetes"   Chronic lower back pain    Dementia without behavioral disturbance (HCC)    Diabetes mellitus without complication (HCC)    Eczema    GERD (gastroesophageal reflux disease)    High cholesterol    Hyperlipidemia    Hypertension    Hypothyroidism    OAB (overactive bladder)    Osteoporosis    Thyroid disease    Type II diabetes mellitus (HCC)    Urticaria    Past Surgical History:  Past Surgical History:  Procedure Laterality Date   ABDOMINAL AORTOGRAM W/LOWER EXTREMITY N/A 02/08/2023   Procedure: ABDOMINAL AORTOGRAM W/LOWER EXTREMITY;  Surgeon: Chuck Hint, MD;  [facility]: [facility] INVASIVE CV LAB;  Service: Vascular;  Laterality: N/A;   ABDOMINAL HYSTERECTOMY     KNEE ARTHROSCOPY     LAPAROTOMY N/A 12/03/2016   Procedure: EXPLORATORY LAPAROTOMY, LYSIS OF ADHESIONS;  Surgeon: Darnell Level, MD;  Location: [facility] ORS;  Service: General;  Laterality: N/A;   LOOP RECORDER INSERTION N/A 08/23/2019   Procedure: LOOP RECORDER INSERTION;  Surgeon: Marinus Maw, MD;  Location: [facility] INVASIVE CV LAB;  Service: Cardiovascular;  Laterality: N/A;   PERIPHERAL VASCULAR INTERVENTION  02/08/2023   Procedure: PERIPHERAL VASCULAR INTERVENTION;  Surgeon: Chuck Hint, MD;  Location: [facility] INVASIVE CV LAB;  Service: Vascular;;   vocal cord polyps     HPI:  Patient is an 89 y.o. female presented on 10/23 with AMS.  Portable chest x-ray revealed multifocal pulmonary infiltrates, right greater than left.  CTA chest and CT abdomen/pelvis with contrast were negative  for PE but showed multifocal pneumonia involving the right upper and lower lobes. PMH significant for  insulin-dependent T2DM, dermatomyositis, PAD, history of syncope due to orthostasis and vasovagal episodes, seizure, hypothyroidism, and macrocytic anemia. Recent hospitalization from 10/11-10/16 for acute encephalopathy secondary to Klebsiella UTI. Hx of moderate sensorimotor pharyngo-cervical esophageal dysphagia, most recent MBS on 07/29/22.    Assessment / Plan / Recommendation  Clinical Impression  Patient seen by SLP for bedside swallow evaluation. She was awake and cooperative with her daughter at bedside. Patient's daughter provided SLP with relevant PMH. Patient's daughter reported that at home, she cuts the patient's solids into bite sized pieces and selects softer foods. She endorsed coughing with thin liquids. Patient occasionally requires cues to complete mastication and attend to her POs. Reports are congruent with patient's previous impairments per documentation. SLP directly observed patient with regular, solid food cut into bite sized pieces, puree, and thin liquid via straw. At baseline, patient self feeds. One instance of immediate coughing with thin liquid following bite of puree. Moderately prolonged mastication and oral transit present with mastication of solid foods. Suspicion of delayed swallow initiation. Patient's daughter expressed that they wish to restrict the patient as little as possible, but agreed to a modified barium swallow study to rule out further decline in swallow function as compared to MBS completed on 07/29/22. SLP recommending patient continue with current diet until completion of modified barium swallow study. SLP Visit Diagnosis: Dysphagia, oropharyngeal phase (R13.12)  Aspiration Risk  Moderate aspiration risk    Diet Recommendation Regular;Thin liquid    Liquid Administration via: Cup;Straw Medication Administration: Crushed with puree Supervision: Full  supervision/cueing for compensatory strategies Compensations: Minimize environmental distractions;Small sips/bites;Slow rate Postural Changes: Seated upright at 90 degrees;Remain upright for at least 30 minutes after po intake    Other  Recommendations Oral Care Recommendations: Oral care BID;Staff/trained caregiver to provide oral care    Recommendations for follow up therapy are one component of a multi-disciplinary discharge planning process, led by the attending physician.  Recommendations may be updated based on patient status, additional functional criteria and insurance authorization.  Follow up Recommendations Other (comment) (TBD)      Assistance Recommended at Discharge    Functional Status Assessment    Frequency and Duration            Prognosis Prognosis for improved oropharyngeal function: Guarded Barriers to Reach Goals: Time post onset;Severity of deficits      Swallow Study   General Date of Onset: 05/18/23 HPI: Patient is an 89 y.o. female presented on 10/23 with AMS.  Portable chest x-ray revealed multifocal pulmonary infiltrates, right greater than left.  CTA chest and CT abdomen/pelvis with contrast were negative for PE but showed multifocal pneumonia involving the right upper and lower lobes. PMH significant for  insulin-dependent T2DM, dermatomyositis, PAD, history of syncope due to orthostasis and vasovagal episodes, seizure, hypothyroidism, and macrocytic anemia. Recent hospitalization from 10/11-10/16 for acute encephalopathy secondary to Klebsiella UTI. Hx of moderate sensorimotor pharyngo-cervical esophageal dysphagia, most recent MBS on 07/29/22. Type of Study: Bedside Swallow Evaluation Previous Swallow Assessment: 02/16/23, BSE Diet Prior to this Study: Regular;Thin liquids (Level 0) Temperature Spikes Noted: No Respiratory Status: Room air History of Recent Intubation: No Behavior/Cognition: Alert;Cooperative;Pleasant mood Oral Cavity Assessment: Within  Functional Limits Oral Care Completed by SLP: No Oral Cavity - Dentition: Dentures, top Vision: Functional for self-feeding Self-Feeding Abilities: Able to feed self;Needs assist Patient Positioning: Upright in bed Baseline Vocal Quality: Low vocal intensity Volitional Swallow: Able to elicit    Oral/Motor/Sensory Function Overall Oral Motor/Sensory Function: Within functional limits   Ice Chips Ice chips: Not tested   Thin Liquid Thin Liquid: Impaired Presentation: Straw Pharyngeal  Phase Impairments: Cough - Immediate;Suspected delayed Swallow    Nectar Thick Nectar Thick Liquid: Not tested   Honey Thick Honey Thick Liquid: Not tested   Puree Puree: Within functional limits   Solid     Solid: Impaired Oral Phase Impairments: Impaired mastication Oral Phase Functional Implications: Prolonged oral transit Pharyngeal Phase Impairments: Suspected delayed Swallow      Marline Backbone, B.S., Speech Therapy Student   05/19/2023,10:43 AM

## 2023-05-19 NOTE — Consult Note (Signed)
WOC Nurse Consult Note: patient followed  by wound care center for chronic ulcerations on  toes; last seen 10/28 and switched to collagen dressing; discussed with family member that collagen is not on [facility] formulary and we will use silver while in hospital; family expressed understanding  Reason for Consult: B foot wounds  Wound type: 1.  Full thickness L medial foot (base of great toe) 0.5 cm x 0.5 cm 50% yellow 50% pink dry  2.  Tip of R great toe full thickness 0.5 cm x 0.5 cm 100% yellow brown; tip of L great toe 0.3 cm x 0.3 cm area of callus  3.  Stage 2 Sacrum 1 cm x 1 cm x 0.1 cm 100% pink moist  Pressure Injury POA: Yes Measurement: as above  Wound bed: as above  Drainage (amount, consistency, odor) minimal serosanguinous sacrum; foot wounds dry  Periwound: mild erythema noted around L medial foot wound and R great toe, feet are cold to touch  Dressing procedure/placement/frequency:  Clean L medial foot and R great toe wounds with NS, apply a small piece of silver hydrofiber [facility] [place] [phone]) cut to fit wound bed every other day and cover with silicone foam.  Place sacral foam over Stage 2 sacrum, lift daily to assess.  Turn patient q2 hours.   POC discussed with family and bedside nurse. WOC team will not follow. Re-consult if further needs arise.   Patient to continue follow-up with outpatient wound center after discharge. Home Health comes out twice a week per family member and places collagen dressing.   Thank you,    Priscella Mann MSN, RN-BC, [employer] [phone]

## 2023-05-19 NOTE — Evaluation (Signed)
Physical Therapy Evaluation Patient Details Name: [name] [name] MRN: [id number] DOB: 02-13-1934 Today's Date: 05/19/2023  History of Present Illness  Pt is an 89 y/o F admitted on 05/18/23 after presenting with AMS. Pt found to have multifocal PNA involving the R upper & lower lobes. PMH: dementia, IDDM2, dermatomyositis, PAD, syncope 2/2 orthostasis & vasovagal episodes, seizures, hypothyroidism, macrocytic anemia, recent hospitalization 04/30/23 - 05/05/23 for acute encephalopathy 2/2 UTI  Clinical Impression  Pt seen for PT evaluation with pt agreeable, daughter Misty Stanley) present for session. Pt participates in moving BLE towards EOB during supine>sit but requires max<>total assist overall for bed mobility. Pt tolerated sitting EOB ~5 minutes with max assist 2/2 R lateral lean & decreased righting reactions. Pt appears to be at baseline, per Lisa's report. At this time, pt does not require acute PT services. PT to complete current orders, please re-consult if new needs arise.        If plan is discharge home, recommend the following: Two people to help with walking and/or transfers;Two people to help with bathing/dressing/bathroom;Direct supervision/assist for medications management;Help with stairs or ramp for entrance;Assist for transportation;Assistance with feeding;Assistance with cooking/housework;Direct supervision/assist for financial management;Supervision due to cognitive status   Can travel by private vehicle        Equipment Recommendations Hoyer lift  Recommendations for Other Services       Functional Status Assessment Patient has not had a recent decline in their functional status     Precautions / Restrictions Precautions Precautions: Fall Restrictions Weight Bearing Restrictions: No      Mobility  Bed Mobility Overal bed mobility: Needs Assistance Bed Mobility: Supine to Sit, Sit to Supine, Rolling Rolling: Max assist   Supine to sit: Max assist, HOB elevated,  Used rails (able to move BLE towards EOB with assistance) Sit to supine: Total assist   General bed mobility comments: +2 to scoot to [facility]    Transfers                        Ambulation/Gait                  Stairs            Wheelchair Mobility     Tilt Bed    Modified Rankin (Stroke Patients Only)       Balance Overall balance assessment: Needs assistance Sitting-balance support: Feet supported, Bilateral upper extremity supported Sitting balance-Leahy Scale: Zero   Postural control: Right lateral lean                              Pt with incontinent smear BM, PT provides total assist for peri hygiene, notified NT of pt needing new pure-wick.     Pertinent Vitals/Pain Pain Assessment Pain Assessment: Faces Faces Pain Scale: No hurt    Home Living Family/patient expects to be discharged to:: Private residence Living Arrangements: Children Available Help at Discharge: Family;Available 24 hours/day Type of Home: House Home Access: Ramped entrance       Home Layout: One level Home Equipment: Hospital bed;Wheelchair - manual      Prior Function               Mobility Comments: One daughter states pt requires assistance to come to sitting EOB but once sitting can sustain this position without assistance, stand pivots to recliner with assistance. Other daughter states pt is total assist for all mobility  tasks.       Extremity/Trunk Assessment   Upper Extremity Assessment Upper Extremity Assessment: Generalized weakness    Lower Extremity Assessment Lower Extremity Assessment: Generalized weakness       Communication      Cognition Arousal: Alert Behavior During Therapy: WFL for tasks assessed/performed Overall Cognitive Status: Difficult to assess                                 General Comments: Pt with minimal verbal communication with PT. Follows simple commands throughout session with extra  time. Pleasant.        General Comments      Exercises     Assessment/Plan    PT Assessment Patient does not need any further PT services  PT Problem List         PT Treatment Interventions      PT Goals (Current goals can be found in the Care Plan section)  Acute Rehab PT Goals Patient Stated Goal: none stated PT Goal Formulation: With family Time For Goal Achievement: 06/02/23 Potential to Achieve Goals: Fair    Frequency       Co-evaluation               AM-PAC PT "6 Clicks" Mobility  Outcome Measure Help needed turning from your back to your side while in a flat bed without using bedrails?: Total Help needed moving from lying on your back to sitting on the side of a flat bed without using bedrails?: Total Help needed moving to and from a bed to a chair (including a wheelchair)?: Total Help needed standing up from a chair using your arms (e.g., wheelchair or bedside chair)?: Total Help needed to walk in hospital room?: Total Help needed climbing 3-5 steps with a railing? : Total 6 Click Score: 6    End of Session   Activity Tolerance: Patient tolerated treatment well Patient left: in bed;with call bell/phone within reach;with bed alarm set;with family/visitor present Nurse Communication: Mobility status      Time: 9381-0175 PT Time Calculation (min) (ACUTE ONLY): 13 min   Charges:   PT Evaluation $PT Eval Low Complexity: 1 Low   PT General Charges $$ ACUTE PT VISIT: 1 Visit         Aleda Grana, PT, DPT 05/19/23, 2:44 PM   Sandi Mariscal 05/19/2023, 2:42 PM

## 2023-05-19 NOTE — Progress Notes (Signed)
Pt received to [address], room 35, from ED.  Daughter at bs.  Pt assist x4 from stretcher to bed.  Skin assessed with Natalia,RN.  Pt alert but nonverbal except to say "thank You."  See assessment, vs's, and admission data base for further details.  No tele.  Pt appears comfortable and went off to sleep soon after arriving to unit.  Daughter also left for the night.  Will cont to observe.

## 2023-05-20 ENCOUNTER — Inpatient Hospital Stay (HOSPITAL_COMMUNITY): Payer: Medicare HMO

## 2023-05-20 DIAGNOSIS — J189 Pneumonia, unspecified organism: Secondary | ICD-10-CM | POA: Diagnosis not present

## 2023-05-20 LAB — CBC WITH DIFFERENTIAL/PLATELET
Abs Immature Granulocytes: 0.06 10*3/uL (ref 0.00–0.07)
Basophils Absolute: 0 10*3/uL (ref 0.0–0.1)
Basophils Relative: 0 %
Eosinophils Absolute: 0.3 10*3/uL (ref 0.0–0.5)
Eosinophils Relative: 4 %
HCT: 27.8 % — ABNORMAL LOW (ref 36.0–46.0)
Hemoglobin: 9.3 g/dL — ABNORMAL LOW (ref 12.0–15.0)
Immature Granulocytes: 1 %
Lymphocytes Relative: 16 %
Lymphs Abs: 1.3 10*3/uL (ref 0.7–4.0)
MCH: 33.1 pg (ref 26.0–34.0)
MCHC: 33.5 g/dL (ref 30.0–36.0)
MCV: 98.9 fL (ref 80.0–100.0)
Monocytes Absolute: 0.5 10*3/uL (ref 0.1–1.0)
Monocytes Relative: 7 %
Neutro Abs: 5.9 10*3/uL (ref 1.7–7.7)
Neutrophils Relative %: 72 %
Platelets: 290 10*3/uL (ref 150–400)
RBC: 2.81 MIL/uL — ABNORMAL LOW (ref 3.87–5.11)
RDW: 14.6 % (ref 11.5–15.5)
WBC: 8.1 10*3/uL (ref 4.0–10.5)
nRBC: 0 % (ref 0.0–0.2)

## 2023-05-20 LAB — BASIC METABOLIC PANEL
Anion gap: 12 (ref 5–15)
BUN: 11 mg/dL (ref 8–23)
CO2: 25 mmol/L (ref 22–32)
Calcium: 9.6 mg/dL (ref 8.9–10.3)
Chloride: 101 mmol/L (ref 98–111)
Creatinine, Ser: 0.76 mg/dL (ref 0.44–1.00)
GFR, Estimated: >60 mL/min (ref >60)
Glucose, Bld: 224 mg/dL — ABNORMAL HIGH (ref 70–99)
Potassium: 3.3 mmol/L — ABNORMAL LOW (ref 3.5–5.1)
Sodium: 138 mmol/L (ref 135–145)

## 2023-05-20 LAB — GLUCOSE, CAPILLARY
Glucose-Capillary: 69 mg/dL — ABNORMAL LOW (ref 70–99)
Glucose-Capillary: 140 mg/dL — ABNORMAL HIGH (ref 70–99)
Glucose-Capillary: 235 mg/dL — ABNORMAL HIGH (ref 70–99)
Glucose-Capillary: 231 mg/dL — ABNORMAL HIGH (ref 70–99)
Glucose-Capillary: 257 mg/dL — ABNORMAL HIGH (ref 70–99)

## 2023-05-20 LAB — MAGNESIUM: Magnesium: 1.7 mg/dL (ref 1.7–2.4)

## 2023-05-20 MED ORDER — ADULT MULTIVITAMIN W/MINERALS CH
1.0000 | ORAL_TABLET | Freq: Every day | ORAL | Status: DC
  Administered 2023-05-20 – 2023-05-24 (×4): 1 via ORAL
  Filled 2023-05-20 (×4): qty 1

## 2023-05-20 MED ORDER — ENSURE ENLIVE PO LIQD
237.0000 mL | Freq: Two times a day (BID) | ORAL | Status: DC
Start: 2023-05-20 — End: 2023-05-24
  Administered 2023-05-20 – 2023-05-24 (×4): 237 mL via ORAL

## 2023-05-20 MED ORDER — DEXTROSE 50 % IV SOLN
12.5000 g | Freq: Every day | INTRAVENOUS | Status: DC | PRN
  Administered 2023-05-21: 12.5 g via INTRAVENOUS
  Filled 2023-05-20: qty 50

## 2023-05-20 NOTE — Progress Notes (Signed)
PROGRESS NOTE    [name] [name]  JXB:[id number] DOB: May 11, 1934 DOA: 05/18/2023 PCP: [name], MD   Brief Narrative:  89 y.o. female with medical history significant for dementia, insulin-dependent T2DM, dermatomyositis (on Plaquenil and chronic prednisone 5 mg daily), PAD, history of syncope due to orthostasis and vasovagal episodes, history of seizure, hypothyroidism, macrocytic anemia, recent hospitalization from 1011 24-10 1624 for acute encephalopathy secondary to Klebsiella UTI treated with IV antibiotics presented with altered mental status.  On presentation, WBC was 20, lactic acid of 1.9.  COVID-19/influenza/RSV PCR negative.  UA negative for UTI.  Portable chest x-ray showed multifocal pulmonary infiltrates, right greater than left.  CTA chest and CT abdomen/pelvis with contrast were negative for PE but showed multifocal pneumonia involving the right upper and lower lobes.  She was started on IV fluids and antibiotics.  Assessment & Plan:   Multifocal right-sided pneumonia -Possibly has a component of aspiration as well in the background of encephalopathy and dementia. -Continue Rocephin and doxycycline.  Cultures negative so far.  Oxygen supplementation if needed.  Currently on room air.  Diet as per SLP recommendations.  Acute metabolic encephalopathy Dementia -Presented with worsening of mental status possibly due to pneumonia.  Patient has baseline dementia.  Monitor mental status.  Fall precautions.  Delirium precautions.  PT following.  Dermatomyositis Chronic debility with bed/wheelchair bound -Continue prednisone.  Plaquenil and methotrexate on hold for now.  Outpatient follow-up with rheumatology  Chronic bilateral great toes wounds -Follow wound care consult recommendations.  X-rays of left and right great toes did not show any acute abnormality  Diabetes mellitus type 2 with hypoglycemia and hyperglycemia -Continue long-acting insulin.  Patient has had episodes  of hypoglycemia overnight and hyperglycemia during the daytime.  Encourage oral intake.  Continue CBGs with SSI.  Consult diabetes coordinator  Hypertension with history of orthostatic hypotension -Blood pressure currently on the higher side.  Monitor.  Family want to continue midodrine  History of seizure -Continue Keppra  Peripheral artery disease -Status post angioplasty of bilateral common iliac artery stenosis in 01/2023.  Continue aspirin, Plavix, Zetia  Leukocytosis -Improving.  Monitor  Anemia of chronic disease/normocytic anemia -From chronic illnesses.  Hemoglobin stable.  Monitor intermittently  Hypothyroidism -Continue levothyroxine  Goals of care -Patient is a DNR.  Palliative care had followed with the patient as an outpatient in the past but is not currently following anymore.  Continue antibiotics IV for the next few days and await response: If no improvement, will consult palliative care  DVT prophylaxis: Lovenox Code Status: DNR Family Communication: Daughter at bedside on 05/19/23 Disposition Plan: Status is: Inpatient Remains inpatient appropriate because: Of severity of illness    Consultants: None  Procedures: None  Antimicrobials: Rocephin and doxycycline from 05/18/2023 onwards   Subjective: Patient seen and examined at bedside.  Awake, confused, poor historian.  No seizures, vomiting, agitation reported.  Objective: Vitals:   05/19/23 1542 05/19/23 2031 05/20/23 0500 05/20/23 0752  BP: (!) 144/69 137/77 (!) 160/61 (!) 159/62  Pulse: 64 73 69 66  Resp: 18 17 18 16   Temp:  98 F (36.7 C) 97.6 F (36.4 C) 97.9 F (36.6 C)  TempSrc:  Oral Oral Oral  SpO2:  100% 98% 100%  Weight:      Height:        Intake/Output Summary (Last 24 hours) at 05/20/2023 0851 Last data filed at 05/20/2023 0657 Gross per 24 hour  Intake 477 ml  Output 1351 ml  Net -874  ml   Filed Weights   05/18/23 2028 05/18/23 2339  Weight: 54 kg 46.9 kg     Examination:  General: On room air currently.  No distress.  Chronically ill and deconditioned looking.  Elderly female lying in bed. ENT/neck: No thyromegaly.  JVD is not elevated  respiratory: Decreased breath sounds at bases bilaterally with some crackles; no wheezing  CVS: S1-S2 heard, rate controlled currently Abdominal: Soft, nontender, slightly distended; no organomegaly, bowel sounds are heard Extremities: Trace lower extremity edema; no cyanosis  CNS: Awake, confused, very slow to respond, poor historian.  No focal neurologic deficit.  Moves extremities Lymph: No obvious lymphadenopathy Skin: No obvious ecchymosis/lesions  psych: Extremely flat affect.  Not agitated currently. musculoskeletal: No obvious joint swelling/deformity    Data Reviewed: I have personally reviewed following labs and imaging studies  CBC: Recent Labs  Lab 05/18/23 1623 05/19/23 0651  WBC 20.0* 13.2*  NEUTROABS 18.5*  --   HGB 10.1*  10.9* 8.6*  HCT 31.1*  32.0* 26.2*  MCV 98.7 97.8  PLT 310 266   Basic Metabolic Panel: Recent Labs  Lab 05/18/23 1623 05/19/23 0651  NA 135  134* 135  K 4.3  4.4 3.6  CL 95* 100  CO2 30 25  GLUCOSE 184* 66*  BUN 17 12  CREATININE 0.92 0.70  CALCIUM 10.3 9.7   GFR: Estimated Creatinine Clearance: 36.7 mL/min (by C-G formula based on SCr of 0.7 mg/dL). Liver Function Tests: Recent Labs  Lab 05/18/23 1623  AST 35  ALT 37  ALKPHOS 79  BILITOT 0.6  PROT 6.9  ALBUMIN 3.2*   No results for input(s): "LIPASE", "AMYLASE" in the last 168 hours. Recent Labs  Lab 05/18/23 1640  AMMONIA <10   Coagulation Profile: Recent Labs  Lab 05/18/23 1623  INR 1.1   Cardiac Enzymes: Recent Labs  Lab 05/18/23 1623  CKTOTAL 19*   BNP (last 3 results) No results for input(s): "PROBNP" in the last 8760 hours. HbA1C: No results for input(s): "HGBA1C" in the last 72 hours. CBG: Recent Labs  Lab 05/19/23 1825 05/19/23 1953 05/19/23 2344  05/20/23 0458 05/20/23 0751  GLUCAP 303* 320* 241* 69* 140*   Lipid Profile: No results for input(s): "CHOL", "HDL", "LDLCALC", "TRIG", "CHOLHDL", "LDLDIRECT" in the last 72 hours. Thyroid Function Tests: Recent Labs    05/18/23 1623 05/18/23 1625  TSH  --  1.472  FREET4 1.19*  --    Anemia Panel: No results for input(s): "VITAMINB12", "FOLATE", "FERRITIN", "TIBC", "IRON", "RETICCTPCT" in the last 72 hours. Sepsis Labs: Recent Labs  Lab 05/18/23 1623 05/19/23 0651  PROCALCITON  --  1.01  LATICACIDVEN 1.9  --     Recent Results (from the past 240 hour(s))  Resp panel by RT-PCR (RSV, Flu A&B, Covid) Anterior Nasal Swab     Status: None   Collection Time: 05/18/23  3:43 PM   Specimen: Anterior Nasal Swab  Result Value Ref Range Status   SARS Coronavirus 2 by RT PCR NEGATIVE NEGATIVE Final   Influenza A by PCR NEGATIVE NEGATIVE Final   Influenza B by PCR NEGATIVE NEGATIVE Final    Comment: (NOTE) The Xpert Xpress SARS-CoV-2/FLU/RSV plus assay is intended as an aid in the diagnosis of influenza from Nasopharyngeal swab specimens and should not be used as a sole basis for treatment. Nasal washings and aspirates are unacceptable for Xpert Xpress SARS-CoV-2/FLU/RSV testing.  Fact Sheet for Patients: BloggerCourse.com  Fact Sheet for Healthcare Providers: SeriousBroker.it  This test is not  yet approved or cleared by the [place] and has been authorized for detection and/or diagnosis of SARS-CoV-2 by FDA under an Emergency Use Authorization (EUA). This EUA will remain in effect (meaning this test can be used) for the duration of the COVID-19 declaration under Section 564(b)(1) of the Act, 21 U.S.C. section 360bbb-3(b)(1), unless the authorization is terminated or revoked.     Resp Syncytial Virus by PCR NEGATIVE NEGATIVE Final    Comment: (NOTE) Fact Sheet for  Patients: BloggerCourse.com  Fact Sheet for Healthcare Providers: SeriousBroker.it  This test is not yet approved or cleared by the [place] FDA and has been authorized for detection and/or diagnosis of SARS-CoV-2 by FDA under an Emergency Use Authorization (EUA). This EUA will remain in effect (meaning this test can be used) for the duration of the COVID-19 declaration under Section 564(b)(1) of the Act, 21 U.S.C. section 360bbb-3(b)(1), unless the authorization is terminated or revoked.  Performed at [facility], [address] [place], [place] [address]   Blood Culture (routine x 2)     Status: None (Preliminary result)   Collection Time: 05/18/23  3:47 PM   Specimen: BLOOD  Result Value Ref Range Status   Specimen Description BLOOD SITE NOT SPECIFIED  Final   Special Requests   Final    BOTTLES DRAWN AEROBIC AND ANAEROBIC Blood Culture adequate volume   Culture   Final    NO GROWTH 2 DAYS Performed at [facility], [address], [place], [place] [address]    Report Status PENDING  Incomplete  Blood Culture (routine x 2)     Status: None (Preliminary result)   Collection Time: 05/18/23  4:26 PM   Specimen: BLOOD  Result Value Ref Range Status   Specimen Description BLOOD SITE NOT SPECIFIED  Final   Special Requests   Final    BOTTLES DRAWN AEROBIC AND ANAEROBIC Blood Culture adequate volume   Culture   Final    NO GROWTH 2 DAYS Performed at [facility], [address] [place], [place] [address]    Report Status PENDING  Incomplete         Radiology Studies: DG Toe Great Right  Result Date: 05/19/2023 CLINICAL DATA:  Toe wounds EXAM: RIGHT GREAT TOE COMPARISON:  None Available. FINDINGS: No fracture or malalignment. Ulceration at the tip of the first digit. No underlying osseous erosive change. No soft tissue emphysema. Hallux valgus deformity at the first MTP joint with degenerative  changes. Vascular calcifications. IMPRESSION: Ulceration at the tip of the first digit. No radiographic evidence for acute osseous abnormality Electronically Signed   By: Jasmine Pang M.D.   On: 05/19/2023 00:36   DG Toe Great Left  Result Date: 05/19/2023 CLINICAL DATA:  Wound EXAM: LEFT GREAT TOE COMPARISON:  None Available. FINDINGS: There is soft tissue swelling of the first toe. There are punctate calcific densities in the soft tissues along the plantar surface of the distal first phalanx. There is no acute fracture or cortical erosion. There is hallux valgus and mild degenerative change of the first metatarsophalangeal joint. Peripheral vascular calcifications are noted. IMPRESSION: 1. Soft tissue swelling of the first toe. No acute fracture or cortical erosion. 2. Punctate calcific densities in the soft tissues along the plantar surface of the distal first phalanx. Please correlate clinically. Electronically Signed   By: Darliss Cheney M.D.   On: 05/19/2023 00:35   CT Angio Chest PE W and/or Wo Contrast  Result  Date: 05/18/2023 CLINICAL DATA:  Sepsis. Pulmonary embolism (PE) suspected, high prob; Sepsis EXAM: CT ANGIOGRAPHY CHEST CT ABDOMEN AND PELVIS WITH CONTRAST TECHNIQUE: Multidetector CT imaging of the chest was performed using the standard protocol during bolus administration of intravenous contrast. Multiplanar CT image reconstructions and MIPs were obtained to evaluate the vascular anatomy. Multidetector CT imaging of the abdomen and pelvis was performed using the standard protocol during bolus administration of intravenous contrast. RADIATION DOSE REDUCTION: This exam was performed according to the departmental dose-optimization program which includes automated exposure control, adjustment of the mA and/or kV according to patient size and/or use of iterative reconstruction technique. CONTRAST:  75mL OMNIPAQUE IOHEXOL 350 MG/ML SOLN COMPARISON:  None Available. FINDINGS: CTA CHEST FINDINGS  Cardiovascular: Satisfactory opacification of the pulmonary arteries to the segmental level. No evidence of pulmonary embolism. Normal heart size. No pericardial effusion. Normal heart size. No significant pericardial effusion. The thoracic aorta is normal in caliber. Atherosclerotic plaque. Atherosclerotic plaque of the thoracic aorta. Four-vessel coronary artery calcifications. Mediastinum/Nodes: No enlarged mediastinal, hilar, or axillary lymph nodes. Thyroid gland, trachea, and esophagus demonstrate no significant findings. Lungs/Pleura: Patchy right upper lobe consolidative and ground-glass airspace opacities (3:62) right lower lobe atelectasis. To a lesser extent similar finding within the right lower lobe (3:76). No focal consolidation. No pulmonary nodule. No pulmonary mass. No pleural effusion. No pneumothorax. Musculoskeletal: No chest wall abnormality. No suspicious lytic or blastic osseous lesions. No acute displaced fracture. Bilateral shoulder degenerative changes. T1-T2 endplate sclerosis. Review of the MIP images confirms the above findings. CT ABDOMEN and PELVIS FINDINGS Hepatobiliary: No focal liver abnormality. Status post cholecystectomy. No biliary dilatation. Pancreas: No focal lesion. Normal pancreatic contour. No surrounding inflammatory changes. No main pancreatic ductal dilatation. Spleen: Normal in size without focal abnormality. Adrenals/Urinary Tract: No adrenal nodule bilaterally. Bilateral kidneys enhance symmetrically. Fluid density lesion within left kidney likely represents simple renal cyst. Simple renal cysts, in the absence of clinically indicated signs/symptoms, require no independent follow-up. Subcentimeter hypodensities are too small to characterize-no further follow-up indicated. No hydronephrosis. No hydroureter. Circumferential symmetric urinary bladder wall thickening. On delayed imaging, there is no urothelial wall thickening and there are no filling defects in the  opacified portions of the bilateral collecting systems or ureters. Stomach/Bowel: Stomach is within normal limits. No evidence of bowel wall thickening or dilatation. The appendix is not definitely identified with no inflammatory changes in the right lower quadrant to suggest acute appendicitis. Vascular/Lymphatic: Patent bilateral common iliac artery stent. No abdominal aorta or iliac aneurysm. Severe atherosclerotic plaque of the aorta and its branches. No abdominal, pelvic, or inguinal lymphadenopathy. Reproductive: Status post hysterectomy. No adnexal masses. Other: No intraperitoneal free fluid. No intraperitoneal free gas. No organized fluid collection. Musculoskeletal: No abdominal wall hernia or abnormality. No suspicious lytic or blastic osseous lesions. No acute displaced fracture. Multilevel degenerative changes of the spine. Review of the MIP images confirms the above findings. IMPRESSION: 1. No pulmonary embolus. 2. Multifocal pneumonia involving the right upper and lower lobes. Recommend follow-up CT in 3 months to evaluate for complete resolution. 3. Circumferential urinary bladder wall thickening likely due to under distension. Consider urinalysis for exclusion of superimposed infection. 4. Status post cholecystectomy and hysterectomy. 5. Aortic Atherosclerosis (ICD10-I70.0) including four-vessel coronary calcifications. Patent bilateral common iliac artery stent. Electronically Signed   By: Tish Frederickson M.D.   On: 05/18/2023 22:11   CT ABDOMEN PELVIS W CONTRAST  Result Date: 05/18/2023 CLINICAL DATA:  Sepsis. Pulmonary embolism (PE) suspected, high prob; Sepsis  EXAM: CT ANGIOGRAPHY CHEST CT ABDOMEN AND PELVIS WITH CONTRAST TECHNIQUE: Multidetector CT imaging of the chest was performed using the standard protocol during bolus administration of intravenous contrast. Multiplanar CT image reconstructions and MIPs were obtained to evaluate the vascular anatomy. Multidetector CT imaging of the  abdomen and pelvis was performed using the standard protocol during bolus administration of intravenous contrast. RADIATION DOSE REDUCTION: This exam was performed according to the departmental dose-optimization program which includes automated exposure control, adjustment of the mA and/or kV according to patient size and/or use of iterative reconstruction technique. CONTRAST:  75mL OMNIPAQUE IOHEXOL 350 MG/ML SOLN COMPARISON:  None Available. FINDINGS: CTA CHEST FINDINGS Cardiovascular: Satisfactory opacification of the pulmonary arteries to the segmental level. No evidence of pulmonary embolism. Normal heart size. No pericardial effusion. Normal heart size. No significant pericardial effusion. The thoracic aorta is normal in caliber. Atherosclerotic plaque. Atherosclerotic plaque of the thoracic aorta. Four-vessel coronary artery calcifications. Mediastinum/Nodes: No enlarged mediastinal, hilar, or axillary lymph nodes. Thyroid gland, trachea, and esophagus demonstrate no significant findings. Lungs/Pleura: Patchy right upper lobe consolidative and ground-glass airspace opacities (3:62) right lower lobe atelectasis. To a lesser extent similar finding within the right lower lobe (3:76). No focal consolidation. No pulmonary nodule. No pulmonary mass. No pleural effusion. No pneumothorax. Musculoskeletal: No chest wall abnormality. No suspicious lytic or blastic osseous lesions. No acute displaced fracture. Bilateral shoulder degenerative changes. T1-T2 endplate sclerosis. Review of the MIP images confirms the above findings. CT ABDOMEN and PELVIS FINDINGS Hepatobiliary: No focal liver abnormality. Status post cholecystectomy. No biliary dilatation. Pancreas: No focal lesion. Normal pancreatic contour. No surrounding inflammatory changes. No main pancreatic ductal dilatation. Spleen: Normal in size without focal abnormality. Adrenals/Urinary Tract: No adrenal nodule bilaterally. Bilateral kidneys enhance  symmetrically. Fluid density lesion within left kidney likely represents simple renal cyst. Simple renal cysts, in the absence of clinically indicated signs/symptoms, require no independent follow-up. Subcentimeter hypodensities are too small to characterize-no further follow-up indicated. No hydronephrosis. No hydroureter. Circumferential symmetric urinary bladder wall thickening. On delayed imaging, there is no urothelial wall thickening and there are no filling defects in the opacified portions of the bilateral collecting systems or ureters. Stomach/Bowel: Stomach is within normal limits. No evidence of bowel wall thickening or dilatation. The appendix is not definitely identified with no inflammatory changes in the right lower quadrant to suggest acute appendicitis. Vascular/Lymphatic: Patent bilateral common iliac artery stent. No abdominal aorta or iliac aneurysm. Severe atherosclerotic plaque of the aorta and its branches. No abdominal, pelvic, or inguinal lymphadenopathy. Reproductive: Status post hysterectomy. No adnexal masses. Other: No intraperitoneal free fluid. No intraperitoneal free gas. No organized fluid collection. Musculoskeletal: No abdominal wall hernia or abnormality. No suspicious lytic or blastic osseous lesions. No acute displaced fracture. Multilevel degenerative changes of the spine. Review of the MIP images confirms the above findings. IMPRESSION: 1. No pulmonary embolus. 2. Multifocal pneumonia involving the right upper and lower lobes. Recommend follow-up CT in 3 months to evaluate for complete resolution. 3. Circumferential urinary bladder wall thickening likely due to under distension. Consider urinalysis for exclusion of superimposed infection. 4. Status post cholecystectomy and hysterectomy. 5. Aortic Atherosclerosis (ICD10-I70.0) including four-vessel coronary calcifications. Patent bilateral common iliac artery stent. Electronically Signed   By: Tish Frederickson M.D.   On:  05/18/2023 22:11   DG Chest Port 1 View  Result Date: 05/18/2023 CLINICAL DATA:  Sepsis EXAM: PORTABLE CHEST 1 VIEW COMPARISON:  05/01/2023 FINDINGS: Multifocal pulmonary infiltrates have developed within the right mid and  lower lung zone, in keeping with multifocal pneumonia in the appropriate clinical setting. Sparse infiltrate also noted at the left lung base. The lungs are symmetrically well expanded. No pneumothorax or pleural effusion. Cardiac size within normal limits. Implanted loop recorder noted. No acute bone abnormality. IMPRESSION: 1. Multifocal pulmonary infiltrates, right greater than left, in keeping with multifocal pneumonia in the appropriate clinical setting. Electronically Signed   By: Helyn Numbers M.D.   On: 05/18/2023 20:08   CT Head Wo Contrast  Result Date: 05/18/2023 CLINICAL DATA:  Delirium EXAM: CT HEAD WITHOUT CONTRAST TECHNIQUE: Contiguous axial images were obtained from the base of the skull through the vertex without intravenous contrast. RADIATION DOSE REDUCTION: This exam was performed according to the departmental dose-optimization program which includes automated exposure control, adjustment of the mA and/or kV according to patient size and/or use of iterative reconstruction technique. COMPARISON:  None Available. FINDINGS: Brain: There is no mass, hemorrhage or extra-axial collection. There is generalized atrophy without lobar predilection. Hypodensity of the white matter is most commonly associated with chronic microvascular disease. Vascular: Atherosclerotic calcification of the internal carotid arteries at the skull base. No abnormal hyperdensity of the major intracranial arteries or dural venous sinuses. Skull: The visualized skull base, calvarium and extracranial soft tissues are normal. Sinuses/Orbits: No fluid levels or advanced mucosal thickening of the visualized paranasal sinuses. No mastoid or middle ear effusion. Normal orbits. IMPRESSION: 1. No acute  intracranial abnormality. 2. Generalized atrophy and findings of chronic microvascular disease. Electronically Signed   By: Deatra Robinson M.D.   On: 05/18/2023 18:59        Scheduled Meds:  aspirin EC  81 mg Oral q AM   clopidogrel  75 mg Oral Q breakfast   dextrose  12.5 g Intravenous STAT   doxycycline  100 mg Oral BID   enoxaparin (LOVENOX) injection  40 mg Subcutaneous Q24H   ezetimibe  10 mg Oral QHS   famotidine  20 mg Oral QHS   ferrous sulfate  300 mg Oral Once per day on Monday Wednesday Friday   folic acid  1 mg Oral QHS   insulin aspart  0-5 Units Subcutaneous QHS   insulin aspart  0-9 Units Subcutaneous TID WC   insulin glargine-yfgn  5 Units Subcutaneous Daily   levETIRAcetam  500 mg Oral BID   levothyroxine  100 mcg Oral Q0600   midodrine  5 mg Oral TID WC   predniSONE  5 mg Oral Q breakfast   Continuous Infusions:  cefTRIAXone (ROCEPHIN)  IV 2 g (05/19/23 1056)          Glade Lloyd, MD [facility] 05/20/2023, 8:51 AM

## 2023-05-20 NOTE — Inpatient Diabetes Management (Signed)
Inpatient Diabetes Program Recommendations  AACE/ADA: New Consensus Statement on Inpatient Glycemic Control (2015)  Target Ranges:  Prepandial:   less than 140 mg/dL      Peak postprandial:   less than 180 mg/dL (1-2 hours)      Critically ill patients:  140 - 180 mg/dL   Lab Results  Component Value Date   GLUCAP 235 (H) 05/20/2023   HGBA1C 8.3 (H) 05/03/2023    Review of Glycemic Control  Latest Reference Range & Units 05/19/23 08:33 05/19/23 12:07 05/19/23 15:41 05/19/23 18:25 05/19/23 19:53 05/19/23 23:44 05/20/23 04:58 05/20/23 07:51 05/20/23 11:44  Glucose-Capillary 70 - 99 mg/dL 79 161 (H) 096 (H) 045 (H) 320 (H) 241 (H) 69 (L) 140 (H) 235 (H)   Diabetes history: DM 2 Outpatient Diabetes medications: Levemir 7 units qam, 3 units qpm, Novolog 2-10 units tid Current orders for Inpatient glycemic control:  Semglee 5 units Daily Novolog 0-9 units tid + hs  PO prednisone 5 mg Daily Due to age, dementia, and PO intake would not have tight glycemic control even if trends go into the 300's but come back down for the fastin glucose. Could start meal coverage with parameters at home if family is use to giving insulin to cover meal intake since pt on regular diet (would not change diet).  Note: Hypoglycemia after insulin given 2 hours outside of administration window.  Inpatient Diabetes Program Recommendations:    -   Consider adding Novolog 2 units meal coverage if eating >50% of meals, could teach family parameters. May need to reduce basal insulin dose for home.  Thanks, Christena Deem RN, MSN, BC-ADM Inpatient Diabetes Coordinator Team Pager [phone] (8a-5p)

## 2023-05-20 NOTE — Progress Notes (Signed)
Modified Barium Swallow Study  Patient Details  Name: [name] [name] MRN: [id number] Date of Birth: 1934-07-01  Today's Date: 05/20/2023  HPI/PMH: HPI: Patient is an 89 y.o. female presented on 10/23 with AMS.  Portable chest x-ray revealed multifocal pulmonary infiltrates, right greater than left.  CTA chest and CT abdomen/pelvis with contrast were negative for PE but showed multifocal pneumonia involving the right upper and lower lobes. PMH significant for  insulin-dependent T2DM, dermatomyositis, PAD, history of syncope due to orthostasis and vasovagal episodes, seizure, hypothyroidism, and macrocytic anemia. Recent hospitalization from 10/11-10/16 for acute encephalopathy secondary to Klebsiella UTI. Chronic hx of progressive dysphagia; three prior MBS studies, each showing deteriorating function since the last.   Clinical Impression: Clinical Impression: [name] [name] presents with deteriorating overall swallow function since last MBS in January of 2024.  There was notable aspiration of thin and nectar-thick liquids (generally without a cough response), and honey-thick liquids as well as purees were observed to enter the laryngeal vestibule during the course of the study.  There was poor propulsion of all POs through the pharynx due to impaired base-of-tongue retraction, decreased pharyngeal squeeze, reduced mobility of the larynx, and poor epiglottic inversion.  These factors led to significant residue of all POs that remained despite attempts at an effortful swallow, use of chin tuck, and liquid washes.  There was aspiration of residue after the swallow and aspiration of liquids due to ineffective closure of the laryngeal vestibule.   Called pt's daughters after the study. They have been caring for their mother at home and have an excellent understanding of their mother's swallowing issues, the concerns around progressing dysphagia and the adverse impact of aspiration.  We discussed having further  conversations with Palliative Medicine (they have worked with Palliative Care and Home Hospice in the past; the experience with home hospice was quite negative).  They agree it would be helpful to gather more help in decision-making for the future. For now, continue a regular diet/thin liquids so [name] [name]' daughters can select foods their mom enjoys. They are here to feed her all her meals.  SLP will follow for further support/education as needed.  Factors that may increase risk of adverse event in presence of aspiration [name] [name] & [name] [name] 2021): Factors that may increase risk of adverse event in presence of aspiration [name] [name] & [name] [name] 2021): Reduced cognitive function; Poor general health and/or compromised immunity; Frail or deconditioned; Dependence for feeding and/or oral hygiene; Weak cough; Aspiration of thick, dense, and/or acidic materials; Frequent aspiration of large volumes   Recommendations/Plan: Swallowing Evaluation Recommendations Swallowing Evaluation Recommendations Recommendations: PO diet PO Diet Recommendation: Regular; Thin liquids (Level 0) Liquid Administration via: Cup; Straw Medication Administration: Crushed with puree Supervision: Full assist for feeding Swallowing strategies  : Slow rate; Small bites/sips Postural changes: Position pt fully upright for meals Oral care recommendations: Oral care BID (2x/day); Staff/trained caregiver to provide oral care Recommended consults: Consider Palliative care    Treatment Plan Treatment Plan Treatment recommendations: Therapy as outlined in treatment plan below Follow-up recommendations: No SLP follow up Functional status assessment: Patient has had a recent decline in their functional status and demonstrates the ability to make significant improvements in function in a reasonable and predictable amount of time. Treatment frequency: Min 2x/week Treatment duration: 1 week Interventions: Diet toleration management by SLP;  Patient/family education     Recommendations Recommendations for follow up therapy are one component of a multi-disciplinary discharge planning process, led by the attending physician.  Recommendations may  be updated based on patient status, additional functional criteria and insurance authorization.  Assessment: Orofacial Exam: Orofacial Exam Oral Cavity - Dentition: Dentures, top    Anatomy:  Anatomy: Suspected cervical osteophytes   Boluses Administered: Boluses Administered Boluses Administered: Thin liquids (Level 0); Mildly thick liquids (Level 2, nectar thick); Moderately thick liquids (Level 3, honey thick); Puree     Oral Impairment Domain: Oral Impairment Domain Lip Closure: No labial escape Tongue control during bolus hold: Cohesive bolus between tongue to palatal seal Bolus preparation/mastication: Slow prolonged chewing/mashing with complete recollection Bolus transport/lingual motion: Delayed initiation of tongue motion (oral holding) Oral residue: Trace residue lining oral structures Location of oral residue : Tongue; Floor of mouth Initiation of pharyngeal swallow : Pyriform sinuses     Pharyngeal Impairment Domain: Pharyngeal Impairment Domain Soft palate elevation: No bolus between soft palate (SP)/pharyngeal wall (PW) Laryngeal elevation: Partial superior movement of thyroid cartilage/partial approximation of arytenoids to epiglottic petiole Anterior hyoid excursion: Partial anterior movement Epiglottic movement: Partial inversion Laryngeal vestibule closure: Incomplete, narrow column air/contrast in laryngeal vestibule Pharyngeal stripping wave : Present - diminished Pharyngeal contraction (A/P view only): N/A Pharyngoesophageal segment opening: Partial distention/partial duration, partial obstruction of flow Tongue base retraction: Wide column of contrast or air between tongue base and PPW Pharyngeal residue: Majority of contrast within or on  pharyngeal structures Location of pharyngeal residue: Valleculae; Pharyngeal wall; Pyriform sinuses     Esophageal Impairment Domain: Esophageal Impairment Domain Esophageal [name] upright position: -- (NT)    Pill: Pill Consistency administered: -- (NT)    Penetration/Aspiration Scale Score: Penetration/Aspiration Scale Score 3.  Material enters airway, remains ABOVE vocal cords and not ejected out: Puree; Moderately thick liquids (Level 3, honey thick) 8.  Material enters airway, passes BELOW cords without attempt by patient to eject out (silent aspiration) : Thin liquids (Level 0); Mildly thick liquids (Level 2, nectar thick)    Compensatory Strategies: Compensatory Strategies Compensatory strategies: Yes Straw: Ineffective Effortful swallow: Ineffective Chin tuck: Ineffective       General Information: Caregiver present: No   Diet Prior to this Study: Regular; Thin liquids (Level 0)    Temperature : Normal    No data recorded   Supplemental O2: None (Room air)    No data recorded  Behavior/Cognition: Alert; Cooperative; Pleasant mood  Self-Feeding Abilities: Needs set-up for self-feeding  No data recorded Volitional Cough: Able to elicit  Volitional Swallow: Able to elicit  Exam Limitations: No limitations   Goal Planning: Prognosis for improved oropharyngeal function: Guarded  Barriers to Reach Goals: Severity of deficits  No data recorded Patient/Family Stated Goal: none stated  Consulted and agree with results and recommendations: Family member/caregiver   Pain: Pain Assessment Pain Assessment: Faces Pain Score: 0 Faces Pain Scale: 0    End of Session: Start Time:SLP Start Time (ACUTE ONLY): 1012  Stop Time: SLP Stop Time (ACUTE ONLY): 1032  Time Calculation:SLP Time Calculation (min) (ACUTE ONLY): 20 min  Charges: SLP Evaluations $ SLP Speech Visit: 1 Visit  SLP Evaluations $BSS Swallow: 1 Procedure $MBS Swallow: 1  Procedure   SLP visit diagnosis: SLP Visit Diagnosis: Dysphagia, oropharyngeal phase (R13.12)    Past Medical History:  Past Medical History:  Diagnosis Date   Arthritis    "knees, legs" (06/10/2016)   Ascites    Chronic kidney disease    "related to my diabetes"   Chronic lower back pain    Dementia without behavioral disturbance (HCC)    Diabetes mellitus without complication (  HCC)    Eczema    GERD (gastroesophageal reflux disease)    High cholesterol    Hyperlipidemia    Hypertension    Hypothyroidism    OAB (overactive bladder)    Osteoporosis    Thyroid disease    Type II diabetes mellitus (HCC)    Urticaria    Past Surgical History:  Past Surgical History:  Procedure Laterality Date   ABDOMINAL AORTOGRAM W/LOWER EXTREMITY N/A 02/08/2023   Procedure: ABDOMINAL AORTOGRAM W/LOWER EXTREMITY;  Surgeon: Chuck Hint, MD;  Location: [facility] INVASIVE CV LAB;  Service: Vascular;  Laterality: N/A;   ABDOMINAL HYSTERECTOMY     KNEE ARTHROSCOPY     LAPAROTOMY N/A 12/03/2016   Procedure: EXPLORATORY LAPAROTOMY, LYSIS OF ADHESIONS;  Surgeon: Darnell Level, MD;  Location: [facility] ORS;  Service: General;  Laterality: N/A;   LOOP RECORDER INSERTION N/A 08/23/2019   Procedure: LOOP RECORDER INSERTION;  Surgeon: Marinus Maw, MD;  Location: [facility] INVASIVE CV LAB;  Service: Cardiovascular;  Laterality: N/A;   PERIPHERAL VASCULAR INTERVENTION  02/08/2023   Procedure: PERIPHERAL VASCULAR INTERVENTION;  Surgeon: Chuck Hint, MD;  Location: [facility] INVASIVE CV LAB;  Service: Vascular;;   vocal cord polyps     Krista Godsil L. Samson Frederic, MA CCC/SLP Clinical Specialist - Acute Care SLP Acute Rehabilitation Services Office number [phone]  Blenda Mounts Laurice 05/20/2023, 12:23 PM

## 2023-05-20 NOTE — Progress Notes (Signed)
Initial Nutrition Assessment  DOCUMENTATION CODES:   Severe malnutrition in context of chronic illness, Underweight  INTERVENTION:  Continue with current diet. Provide Ensure Plus High Protein po BID, each supplement provides 350 kcal and 20 grams of protein. Provide Multivitamin/minerals daily.    NUTRITION DIAGNOSIS:   Severe Malnutrition related to chronic illness as evidenced by severe fat depletion, severe muscle depletion.     GOAL:   Patient will meet greater than or equal to 90% of their needs   MONITOR:   Supplement acceptance, PO intake, Labs, Weight trends  REASON FOR ASSESSMENT:   Consult Assessment of nutrition requirement/status  ASSESSMENT:  89 y.o. F, admitted with Multifocal pneumonia.Pmh: dementai, T2DM, PAD, history of syncope due to orthostasis and vasovagal episodes, history of seizure, hypothyroidism, macrocytic anemia. Reported recent hospitalization 10/11-10/16 for acute encephalopathy secondary to Klebsiella UTI treated with IV antibiotics. Currently resides at home with daughters whom care for her.  Patient alert at time of visit.  No family at bedside. She is a poor historian related dx of dementia. She was unable to stayed focused on questions and kept reverting to meetings she had or meetings her daughters have. RD attempted to redirect without success. Information obtained via EMR and Team. EMR Revealed; No current supplements being consumed ate this time, No multivitamin, fair to good appetite. ~17% weight decline times 30 days. Although current weight within base line levels in February.  Admit weight: 46.9 kg Current weight: 46.9 kg  Weight history: 05/18/23 46.9 kg  05/01/23 54 kg  03/25/23 56.7 kg  02/15/23 57.2 kg  02/08/23 56.7 kg  01/27/23 45.4 kg  10/16/22 45.4 kg  10/14/22 45.4 kg  10/02/22 45.4 kg  09/10/22 46.6 kg      Average Meal Intake: 50-100: 67% intake x 3 recorded meals  Nutritionally Relevant  Medications: Scheduled Meds:  ferrous sulfate  300 mg Oral Once per day on Monday Wednesday Friday   folic acid  1 mg Oral QHS   insulin aspart  0-5 Units Subcutaneous QHS   levETIRAcetam  500 mg Oral BID   levothyroxine  100 mcg Oral Q0600    Labs Reviewed:  HgbA1c 8.3 (05/03/23)    NUTRITION - FOCUSED PHYSICAL EXAM:  Flowsheet Row Most Recent Value  Orbital Region Severe depletion  Upper Arm Region Severe depletion  Thoracic and Lumbar Region Severe depletion  Buccal Region Severe depletion  Temple Region Severe depletion  Clavicle Bone Region Severe depletion  Clavicle and Acromion Bone Region Severe depletion  Scapular Bone Region Severe depletion  Dorsal Hand Severe depletion  Patellar Region Severe depletion  Anterior Thigh Region Severe depletion  Posterior Calf Region Severe depletion  Edema (RD Assessment) None  Hair Reviewed  Eyes Reviewed  Mouth Reviewed  Skin Reviewed  Nails Reviewed       Diet Order:   Diet Order             Diet Carb Modified Fluid consistency: Thin; Room service appropriate? Yes  Diet effective now                   EDUCATION NEEDS:   Not appropriate for education at this time  Skin:  Skin Assessment: Skin Integrity Issues: Skin Integrity Issues:: Diabetic Ulcer, Other (Comment) Diabetic Ulcer: left medial foot Other: non pressure toe and foor  Last BM:  05/19/23  Height:   Ht Readings from Last 1 Encounters:  05/18/23 5\' 7"  (1.702 m)    Weight:   Wt Readings  from Last 1 Encounters:  05/18/23 46.9 kg    Ideal Body Weight:     BMI:  Body mass index is 16.19 kg/m.  Estimated Nutritional Needs:   Kcal:  1400-1650 kcal/d  Protein:  65-75 g/d  Fluid:  >/= 1.5L    Jamelle Haring RDN, LDN Clinical Dietitian  RDN pager # available on Amion

## 2023-05-20 NOTE — Progress Notes (Signed)
[name] [name], [name] [name] ([id number]) [id number] Page 1 of 7 Visit Report for 05/17/2023 Arrival Information Details Patient Name: Date of Service: [name] [name], [name] [name] 05/17/2023 2:30 PM Medical Record Number: [id number] Patient Account Number: [id number] Date of Birth/Sex: Treating RN: [birth year] (89 y.o. F) Primary Care Amorah Sebring: Hillard Danker Other Clinician: Referring Tory Septer: Treating Peachie Barkalow/Extender: Farrell Ours Weeks in Treatment: 22 Visit Information History Since Last Visit Added or deleted any medications: No Patient Arrived: Wheel Chair Any new allergies or adverse reactions: No Arrival Time: 14:26 Had a fall or experienced change in No Accompanied By: daughter activities of daily living that may affect Transfer Assistance: Manual risk of falls: Patient Identification Verified: Yes Signs or symptoms of abuse/neglect since last visito No Secondary Verification Process Completed: Yes Hospitalized since last visit: Yes Patient Requires Transmission-Based Precautions: No Implantable device outside of the clinic excluding No Patient Has Alerts: No cellular tissue based products placed in the center since last visit: Has Dressing in Place as Prescribed: Yes Pain Present Now: No Electronic Signature(s) Signed: 05/19/2023 4:40:46 PM By: Thayer Dallas Entered By: Thayer Dallas on 05/17/2023 11:27:36 -------------------------------------------------------------------------------- Encounter Discharge Information Details Patient Name: Date of Service: [name] Spruce M. 05/17/2023 2:30 PM Medical Record Number: [id number] Patient Account Number: [id number] Date of Birth/Sex: Treating RN: [birth year] (89 y.o. [name] Primary Care Ridhima Golberg: Hillard Danker Other Clinician: Referring Anthany Thornhill: Treating Pilot Prindle/Extender: Farrell Ours Weeks in Treatment: 22 Encounter Discharge Information Items Post Procedure  Vitals Discharge Condition: Stable Temperature (F): 98.2 Ambulatory Status: Wheelchair Pulse (bpm): 65 Discharge Destination: Home Respiratory Rate (breaths/min): 18 Transportation: Private Auto Blood Pressure (mmHg): 153/66 Accompanied By: daughter Schedule Follow-up Appointment: Yes Clinical Summary of Care: Electronic Signature(s) Signed: 05/17/2023 6:15:51 PM By: Shawn Stall RN, BSN Entered By: Shawn Stall on 05/17/2023 12:03:11 [name] [name] ([id number]) [id number] Page 2 of 7 -------------------------------------------------------------------------------- Lower Extremity Assessment Details Patient Name: Date of Service: [name] [name] 05/17/2023 2:30 PM Medical Record Number: [id number] Patient Account Number: [id number] Date of Birth/Sex: Treating RN: January 13, 1934 (89 y.o. F) Primary Care Fairy Ashlock: Hillard Danker Other Clinician: Referring Arielis Leonhart: Treating Yaakov Saindon/Extender: Farrell Ours Weeks in Treatment: 22 Edema Assessment Assessed: [Left: No] [Right: No] Edema: [Left: No] [Right: No] Calf Left: Right: Point of Measurement: 29 cm From Medial Instep 23.5 cm 24 cm Ankle Left: Right: Point of Measurement: 10 cm From Medial Instep 16.5 cm 16.5 cm Vascular Assessment Extremity colors, hair growth, and conditions: Extremity Color: [Right:Normal] Hair Growth on Extremity: [Left:No] [Right:No] Temperature of Extremity: [Left:Cool] [Right:Cool] Capillary Refill: [Left:< 3 seconds] [Right:< 3 seconds] Dependent Rubor: [Left:No No] [Right:No No] Electronic Signature(s) Signed: 05/19/2023 4:40:46 PM By: Thayer Dallas Entered By: Thayer Dallas on 05/17/2023 11:40:12 -------------------------------------------------------------------------------- Multi-Disciplinary Care Plan Details Patient Name: Date of Service: [name] Spruce M. 05/17/2023 2:30 PM Medical Record Number: [id number] Patient Account Number:  [id number] Date of Birth/Sex: Treating RN: 10/16/1933 (89 y.o. [name] Primary Care Cola Highfill: Hillard Danker Other Clinician: Referring Simrit Gohlke: Treating Markisha Meding/Extender: Farrell Ours Weeks in Treatment: 22 Active Inactive Wound/Skin Impairment Nursing Diagnoses: Impaired tissue integrity Goals: Patient/caregiver will verbalize understanding of skin care regimen Date Initiated: 12/10/2022 Target Resolution Date: 07/16/2023 Goal Status: Active Interventions: [name] [name] ([id number]) [id number] Page 3 of 7 Assess ulceration(s) every visit Treatment Activities: Skin care regimen initiated : 12/10/2022 Notes: Electronic Signature(s) Signed: 05/17/2023 6:15:51 PM By: Shawn Stall RN, BSN Entered By: Shawn Stall on 05/17/2023 11:54:23 -------------------------------------------------------------------------------- Pain Assessment Details Patient Name: Date of Service:  [name] [name], [name] M. 05/17/2023 2:30 PM Medical Record Number: [id number] Patient Account Number: [id number] Date of Birth/Sex: Treating RN: 1933/10/07 (89 y.o. F) Primary Care Daleiza Bacchi: Hillard Danker Other Clinician: Referring Floris Neuhaus: Treating Claryce Friel/Extender: Farrell Ours Weeks in Treatment: 22 Active Problems Location of Pain Severity and Description of Pain Patient Has Paino No Site Locations Pain Management and Medication Current Pain Management: Electronic Signature(s) Signed: 05/19/2023 4:40:46 PM By: Thayer Dallas Entered By: Thayer Dallas on 05/17/2023 11:42:02 -------------------------------------------------------------------------------- Patient/Caregiver Education Details Patient Name: Date of Service: [name] [name] 10/28/2024andnbsp2:30 PM Medical Record Number: [id number] Patient Account Number: [id number] Date of Birth/Gender: Treating RN: April 10, 1934 (89 y.o. [name] Primary Care Physician: Hillard Danker Other  Clinician: Referring Physician: Treating Physician/Extender: [name] in Treatment: [address], [place] M ([id number]) [id number] Page 4 of 7 Education Assessment Education Provided To: Patient Education Topics Provided Wound/Skin Impairment: Handouts: Caring for Your Ulcer Methods: Explain/Verbal Responses: Reinforcements needed Electronic Signature(s) Signed: 05/17/2023 6:15:51 PM By: Shawn Stall RN, BSN Entered By: Shawn Stall on 05/17/2023 12:02:11 -------------------------------------------------------------------------------- Wound Assessment Details Patient Name: Date of Service: [name] [name], [name] Sanes. 05/17/2023 2:30 PM Medical Record Number: [id number] Patient Account Number: [id number] Date of Birth/Sex: Treating RN: 14-Jul-1934 (89 y.o. F) Primary Care Markail Diekman: Hillard Danker Other Clinician: Referring Laird Runnion: Treating Berry Godsey/Extender: Farrell Ours Weeks in Treatment: 22 Wound Status Wound Number: 3 Primary Diabetic Wound/Ulcer of the Lower Extremity Etiology: Wound Location: Right, Distal T Great oe Wound Open Wounding Event: Gradually Appeared Status: Date Acquired: 12/09/2021 Comorbid Hypertension, Hypotension, Peripheral Arterial Disease, Peripheral Weeks Of Treatment: 22 History: Venous Disease, Colitis, Type II Diabetes, Dementia, Seizure Clustered Wound: No Disorder Photos Wound Measurements Length: (cm) 0.3 Width: (cm) 0.4 Depth: (cm) 0.1 Area: (cm) 0.094 Volume: (cm) 0.009 % Reduction in Area: 85% % Reduction in Volume: 85.7% Epithelialization: None Tunneling: No Undermining: No Wound Description Classification: Grade 1 Wound Margin: Fibrotic scar, thickened scar Exudate Amount: Medium Exudate Type: Serosanguineous Exudate Color: red, brown Foul Odor After Cleansing: No Slough/Fibrino Yes Wound Bed QUINTERA, BURKLE ([id number]) [id number] Page 5 of  7 Granulation Amount: None Present (0%) Exposed Structure Necrotic Amount: Large (67-100%) Fascia Exposed: No Necrotic Quality: Adherent Slough Fat Layer (Subcutaneous Tissue) Exposed: Yes Tendon Exposed: No Muscle Exposed: No Joint Exposed: No Bone Exposed: No Periwound Skin Texture Texture Color No Abnormalities Noted: Yes No Abnormalities Noted: Yes Moisture Temperature / Pain No Abnormalities Noted: Yes Temperature: No Abnormality Treatment Notes Wound #3 (Toe Great) Wound Laterality: Right, Distal Cleanser Soap and Water Discharge Instruction: May shower and wash wound with dial antibacterial soap and water prior to dressing change. Wound Cleanser Discharge Instruction: Cleanse the wound with wound cleanser prior to applying a clean dressing using gauze sponges, not tissue or cotton balls. Peri-Wound Care Topical Primary Dressing FIBRACOL Plus Dressing, 2x2 in (collagen) Discharge Instruction: Moisten collagen with saline or hydrogel Secondary Dressing Secured With Conforming Stretch Gauze Bandage, Sterile 2x75 (in/in) Discharge Instruction: Secure with stretch gauze as directed. 104M Medipore Soft Cloth Surgical T 2x10 (in/yd) ape Discharge Instruction: Secure with tape as directed. Compression Wrap Compression Stockings Add-Ons Electronic Signature(s) Signed: 05/19/2023 4:40:46 PM By: Thayer Dallas Entered By: Thayer Dallas on 05/17/2023 11:44:03 -------------------------------------------------------------------------------- Wound Assessment Details Patient Name: Date of Service: [name] [name], [name] [name] 05/17/2023 2:30 PM Medical Record Number: [id number] Patient Account Number: [id number] Date of Birth/Sex: Treating RN: [birth year] (89 y.o. F) Primary Care Finbar Nippert: Hillard Danker Other Clinician: Referring Venora Kautzman: Treating Harjas Biggins/Extender: Baltazar Najjar  [name] [name], [name] M. 05/17/2023 2:30 PM Medical Record Number: [id number] Patient Account Number: [id number] Date of Birth/Sex: Treating RN: 1933/10/07 (89 y.o. F) Primary Care Daleiza Bacchi: Hillard Danker Other Clinician: Referring Floris Neuhaus: Treating Claryce Friel/Extender: Farrell Ours Weeks in Treatment: 22 Active Problems Location of Pain Severity and Description of Pain Patient Has Paino No Site Locations Pain Management and Medication Current Pain Management: Electronic Signature(s) Signed: 05/19/2023 4:40:46 PM By: Thayer Dallas Entered By: Thayer Dallas on 05/17/2023 11:42:02 -------------------------------------------------------------------------------- Patient/Caregiver Education Details Patient Name: Date of Service: [name] [name] 10/28/2024andnbsp2:30 PM Medical Record Number: [id number] Patient Account Number: [id number] Date of Birth/Gender: Treating RN: April 10, 1934 (89 y.o. [name] Primary Care Physician: Hillard Danker Other  Clinician: Referring Physician: Treating Physician/Extender: [name] in Treatment: [address], [place] M ([id number]) [id number] Page 4 of 7 Education Assessment Education Provided To: Patient Education Topics Provided Wound/Skin Impairment: Handouts: Caring for Your Ulcer Methods: Explain/Verbal Responses: Reinforcements needed Electronic Signature(s) Signed: 05/17/2023 6:15:51 PM By: Shawn Stall RN, BSN Entered By: Shawn Stall on 05/17/2023 12:02:11 -------------------------------------------------------------------------------- Wound Assessment Details Patient Name: Date of Service: [name] [name], [name] Sanes. 05/17/2023 2:30 PM Medical Record Number: [id number] Patient Account Number: [id number] Date of Birth/Sex: Treating RN: 14-Jul-1934 (89 y.o. F) Primary Care Markail Diekman: Hillard Danker Other Clinician: Referring Laird Runnion: Treating Berry Godsey/Extender: Farrell Ours Weeks in Treatment: 22 Wound Status Wound Number: 3 Primary Diabetic Wound/Ulcer of the Lower Extremity Etiology: Wound Location: Right, Distal T Great oe Wound Open Wounding Event: Gradually Appeared Status: Date Acquired: 12/09/2021 Comorbid Hypertension, Hypotension, Peripheral Arterial Disease, Peripheral Weeks Of Treatment: 22 History: Venous Disease, Colitis, Type II Diabetes, Dementia, Seizure Clustered Wound: No Disorder Photos Wound Measurements Length: (cm) 0.3 Width: (cm) 0.4 Depth: (cm) 0.1 Area: (cm) 0.094 Volume: (cm) 0.009 % Reduction in Area: 85% % Reduction in Volume: 85.7% Epithelialization: None Tunneling: No Undermining: No Wound Description Classification: Grade 1 Wound Margin: Fibrotic scar, thickened scar Exudate Amount: Medium Exudate Type: Serosanguineous Exudate Color: red, brown Foul Odor After Cleansing: No Slough/Fibrino Yes Wound Bed QUINTERA, BURKLE ([id number]) [id number] Page 5 of  7 Granulation Amount: None Present (0%) Exposed Structure Necrotic Amount: Large (67-100%) Fascia Exposed: No Necrotic Quality: Adherent Slough Fat Layer (Subcutaneous Tissue) Exposed: Yes Tendon Exposed: No Muscle Exposed: No Joint Exposed: No Bone Exposed: No Periwound Skin Texture Texture Color No Abnormalities Noted: Yes No Abnormalities Noted: Yes Moisture Temperature / Pain No Abnormalities Noted: Yes Temperature: No Abnormality Treatment Notes Wound #3 (Toe Great) Wound Laterality: Right, Distal Cleanser Soap and Water Discharge Instruction: May shower and wash wound with dial antibacterial soap and water prior to dressing change. Wound Cleanser Discharge Instruction: Cleanse the wound with wound cleanser prior to applying a clean dressing using gauze sponges, not tissue or cotton balls. Peri-Wound Care Topical Primary Dressing FIBRACOL Plus Dressing, 2x2 in (collagen) Discharge Instruction: Moisten collagen with saline or hydrogel Secondary Dressing Secured With Conforming Stretch Gauze Bandage, Sterile 2x75 (in/in) Discharge Instruction: Secure with stretch gauze as directed. 104M Medipore Soft Cloth Surgical T 2x10 (in/yd) ape Discharge Instruction: Secure with tape as directed. Compression Wrap Compression Stockings Add-Ons Electronic Signature(s) Signed: 05/19/2023 4:40:46 PM By: Thayer Dallas Entered By: Thayer Dallas on 05/17/2023 11:44:03 -------------------------------------------------------------------------------- Wound Assessment Details Patient Name: Date of Service: [name] [name], [name] [name] 05/17/2023 2:30 PM Medical Record Number: [id number] Patient Account Number: [id number] Date of Birth/Sex: Treating RN: [birth year] (89 y.o. F) Primary Care Finbar Nippert: Hillard Danker Other Clinician: Referring Venora Kautzman: Treating Harjas Biggins/Extender: Baltazar Najjar

## 2023-05-21 ENCOUNTER — Inpatient Hospital Stay (HOSPITAL_COMMUNITY): Payer: Medicare HMO

## 2023-05-21 DIAGNOSIS — J189 Pneumonia, unspecified organism: Secondary | ICD-10-CM | POA: Diagnosis not present

## 2023-05-21 DIAGNOSIS — Z515 Encounter for palliative care: Secondary | ICD-10-CM

## 2023-05-21 DIAGNOSIS — Z7189 Other specified counseling: Secondary | ICD-10-CM | POA: Diagnosis not present

## 2023-05-21 LAB — CBC WITH DIFFERENTIAL/PLATELET
Abs Immature Granulocytes: 0.03 10*3/uL (ref 0.00–0.07)
Basophils Absolute: 0 10*3/uL (ref 0.0–0.1)
Basophils Relative: 0 %
Eosinophils Absolute: 0.2 10*3/uL (ref 0.0–0.5)
Eosinophils Relative: 2 %
HCT: 28.9 % — ABNORMAL LOW (ref 36.0–46.0)
Hemoglobin: 9.6 g/dL — ABNORMAL LOW (ref 12.0–15.0)
Immature Granulocytes: 0 %
Lymphocytes Relative: 16 %
Lymphs Abs: 1.1 10*3/uL (ref 0.7–4.0)
MCH: 32.3 pg (ref 26.0–34.0)
MCHC: 33.2 g/dL (ref 30.0–36.0)
MCV: 97.3 fL (ref 80.0–100.0)
Monocytes Absolute: 0.5 10*3/uL (ref 0.1–1.0)
Monocytes Relative: 8 %
Neutro Abs: 5.2 10*3/uL (ref 1.7–7.7)
Neutrophils Relative %: 74 %
Platelets: 297 10*3/uL (ref 150–400)
RBC: 2.97 MIL/uL — ABNORMAL LOW (ref 3.87–5.11)
RDW: 14.6 % (ref 11.5–15.5)
WBC: 7 10*3/uL (ref 4.0–10.5)
nRBC: 0 % (ref 0.0–0.2)

## 2023-05-21 LAB — COMPREHENSIVE METABOLIC PANEL
ALT: 37 U/L (ref 0–44)
AST: 38 U/L (ref 15–41)
Albumin: 2.9 g/dL — ABNORMAL LOW (ref 3.5–5.0)
Alkaline Phosphatase: 82 U/L (ref 38–126)
Anion gap: 8 (ref 5–15)
BUN: 9 mg/dL (ref 8–23)
CO2: 27 mmol/L (ref 22–32)
Calcium: 9.4 mg/dL (ref 8.9–10.3)
Chloride: 103 mmol/L (ref 98–111)
Creatinine, Ser: 0.7 mg/dL (ref 0.44–1.00)
GFR, Estimated: >60 mL/min (ref >60)
Glucose, Bld: 112 mg/dL — ABNORMAL HIGH (ref 70–99)
Potassium: 3.3 mmol/L — ABNORMAL LOW (ref 3.5–5.1)
Sodium: 138 mmol/L (ref 135–145)
Total Bilirubin: <0.1 mg/dL — ABNORMAL LOW (ref 0.3–1.2)
Total Protein: 6.3 g/dL — ABNORMAL LOW (ref 6.5–8.1)

## 2023-05-21 LAB — GLUCOSE, CAPILLARY
Glucose-Capillary: 223 mg/dL — ABNORMAL HIGH (ref 70–99)
Glucose-Capillary: 88 mg/dL (ref 70–99)
Glucose-Capillary: 38 mg/dL — CL (ref 70–99)
Glucose-Capillary: 95 mg/dL (ref 70–99)
Glucose-Capillary: 105 mg/dL — ABNORMAL HIGH (ref 70–99)
Glucose-Capillary: 108 mg/dL — ABNORMAL HIGH (ref 70–99)

## 2023-05-21 LAB — MAGNESIUM: Magnesium: 1.6 mg/dL — ABNORMAL LOW (ref 1.7–2.4)

## 2023-05-21 MED ORDER — SODIUM CHLORIDE 0.9 % IV SOLN: INTRAVENOUS | Status: DC

## 2023-05-21 MED ORDER — MAGNESIUM SULFATE 2 GM/50ML IV SOLN
2.0000 g | Freq: Once | INTRAVENOUS | Status: AC
  Administered 2023-05-21: 2 g via INTRAVENOUS
  Filled 2023-05-21: qty 50

## 2023-05-21 MED ORDER — POTASSIUM CHLORIDE 10 MEQ/100ML IV SOLN
10.0000 meq | Freq: Once | INTRAVENOUS | Status: DC
  Filled 2023-05-21: qty 100

## 2023-05-21 MED ORDER — POTASSIUM CHLORIDE 10 MEQ/100ML IV SOLN
10.0000 meq | INTRAVENOUS | Status: AC
  Administered 2023-05-21 (×4): 10 meq via INTRAVENOUS
  Filled 2023-05-21 (×3): qty 100

## 2023-05-21 MED ORDER — INSULIN ASPART 100 UNIT/ML IJ SOLN
0.0000 [IU] | INTRAMUSCULAR | Status: DC
  Administered 2023-05-22: 1 [IU] via SUBCUTANEOUS
  Administered 2023-05-22: 5 [IU] via SUBCUTANEOUS
  Administered 2023-05-23: 2 [IU] via SUBCUTANEOUS
  Administered 2023-05-23 (×2): 1 [IU] via SUBCUTANEOUS
  Administered 2023-05-23 (×2): 2 [IU] via SUBCUTANEOUS
  Administered 2023-05-24 (×2): 1 [IU] via SUBCUTANEOUS

## 2023-05-21 NOTE — Progress Notes (Signed)
Speech Language Pathology Treatment: Dysphagia  Patient Details Name: [name] [name] MRN: [id number] DOB: [birth year] Today's Date: 05/21/2023 Time: 8657-8469 SLP Time Calculation (min) (ACUTE ONLY): 29 min  Assessment / Plan / Recommendation Clinical Impression  F/u after yesterday's MBS. Daughter, [name] [name], at bedside. [name] [name] is obtunded this am.  [name] [name] expressed concern that pt was fed without family being present - when [name] [name] entered room last night her mother was alone with food debris in her mouth.  Today, [name] [name] is not alert enough to eat.  Oral suctioning set-up by this clinician to allow for oral care.  Recommend NPO and re-order SLP assessment should mental status improve this weekend. [name] [name] agrees with plan. Palliative consult is pending given complicated and worsening dysphagia.    HPI HPI: Patient is an 89 y.o. female presented on 10/23 with AMS.  Portable chest x-ray revealed multifocal pulmonary infiltrates, right greater than left.  CTA chest and CT abdomen/pelvis with contrast were negative for PE but showed multifocal pneumonia involving the right upper and lower lobes. PMH significant for  insulin-dependent T2DM, dermatomyositis, PAD, history of syncope due to orthostasis and vasovagal episodes, seizure, hypothyroidism, and macrocytic anemia. Recent hospitalization from 10/11-10/16 for acute encephalopathy secondary to Klebsiella UTI. Chronic hx of progressive dysphagia; three prior MBS studies, each showing deteriorating function since the last.      SLP Plan  Continue with current plan of care      Recommendations for follow up therapy are one component of a multi-disciplinary discharge planning process, led by the attending physician.  Recommendations may be updated based on patient status, additional functional criteria and insurance authorization.    Recommendations  Diet recommendations: NPO                  Oral care QID   Frequent or constant  Supervision/Assistance Dysphagia, oropharyngeal phase (R13.12)     Continue with current plan of care    Sakura Denis L. Samson Frederic, MA CCC/SLP Clinical Specialist - Acute Care SLP Acute Rehabilitation Services Office number [phone]  Blenda Mounts Laurice  05/21/2023, 10:30 AM

## 2023-05-21 NOTE — Consult Note (Signed)
Palliative Care Consult Note                                  Date: 05/21/2023   Patient Name: [name] [name]  DOB: [birth year]  MRN: [id number]  Age / Sex: 89 y.o., female  PCP: [name], MD Referring Physician: Glade Lloyd, MD  Reason for Consultation: Establishing goals of care and family would like to meet with Palliative.  HPI/Patient Profile: 89 y.o. female  with past medical history of dementia, insulin-dependent T2DM, dermatomyositis (on Plaquenil and chronic prednisone 5 mg daily), PAD, history of syncope due to orthostasis and vasovagal episodes, history of seizure, hypothyroidism, macrocytic anemia, recent hospitalization from 04/30/23 -10/ 1624 for acute encephalopathy secondary to Klebsiella UTI treated with IV antibiotics admitted on 05/18/2023 with altered mental status.  CTA chest and CT abdomen/pelvis with contrast were negative for PE but showed multifocal pneumonia involving the right upper and lower lobes. She was started on IV fluids and antibiotics.   Past Medical History:  Diagnosis Date   Arthritis    "knees, legs" (06/10/2016)   Ascites    Chronic kidney disease    "related to my diabetes"   Chronic lower back pain    Dementia without behavioral disturbance (HCC)    Diabetes mellitus without complication (HCC)    Eczema    GERD (gastroesophageal reflux disease)    High cholesterol    Hyperlipidemia    Hypertension    Hypothyroidism    OAB (overactive bladder)    Osteoporosis    Thyroid disease    Type II diabetes mellitus (HCC)    Urticaria     Subjective:   I have reviewed medical records including EPIC notes, labs and imaging, assessed the patient and then met at bedside with the patient's daughters [name] and Cathlean Cower to discuss diagnosis prognosis, GOC, EOL wishes, disposition and options.  I introduced Palliative Medicine as specialized medical care for people living with serious illness.  It focuses on providing relief from symptoms and stress of a serious illness. The goal is to improve quality of life for both the patient and the family.  Created space and opportunity for patient  and family to explore thoughts and feelings regarding current medical situation. Values and goals of care important to patient and family were attempted to be elicited.    Today's Discussion: Patient's daughters have a good understanding of patient's chronic and acute conditions. The patient has had a general decline since July 2024 with 6 inpatient hospitalizations over the last 6 months-- most for infection. The patient lives in her home and her daughters take turns caring for her. They do most ADLs and all IADLs for her. She is bed/chair bound. Her appetite fluctuates with her eating up to two full meals a day (on a good day). She has had increasing delirium-- especially after hospitalizations. SLP saw her this hospitalization and her dysphagia is progressing. I shared that I reviewed my understanding of the patient's illnesses, including limitations of ongoing interventions and high risk for further decline despite treatment efforts. I shared that I believe the patient will continue having rehospitalizations.  We discussed advanced directives including code status and scope of care. Concepts specific to code status, artifical feeding, and rehospitalization was had.  The difference between a aggressive medical intervention path and a palliative comfort care path for this patient at this time was had. Confirmed code status is  DNR. Changed scope of care to limited scope of care. Patient's daughters would not want the patient intubated, would not want vasopressors used, and would not want a feeding tube. They would like to continue current course of treatment and to treat the treatable. Their goal is to keep their mother at home and out of nursing facilities. They are open to having outpatient palliative care at  discharge.  Discussed the importance of continued conversation with family and the medical providers regarding overall plan of care and treatment options, ensuring decisions are within the context of the patient's values and GOCs.  Questions and concerns were addressed. Hard Choices booklet left for review. The family was encouraged to call with questions or concerns. PMT will continue to support holistically.  Review of Systems  Unable to perform ROS   Objective:   Primary Diagnoses: Present on Admission:  Multifocal pneumonia   Physical Exam Vitals reviewed.  Constitutional:      General: She is sleeping.     Appearance: She is ill-appearing.  Cardiovascular:     Rate and Rhythm: Normal rate.  Pulmonary:     Effort: Pulmonary effort is normal.     Vital Signs:  BP (!) 169/63 (BP Location: Left Arm)   Pulse 61   Temp 97.6 F (36.4 C) (Oral)   Resp 18   Ht 5\' 7"  (1.702 m)   Wt 46.9 kg   SpO2 99%   BMI 16.19 kg/m    Advanced Care Planning:   Existing Vynca/ACP Documentation: DNR  Primary Decision Maker: Patient's daughters [name] and Essie Hart  Code Status/Advance Care Planning: DNR  Assessment & Plan:   SUMMARY OF RECOMMENDATIONS   DNR Changed to DNI- Limited scope of treatment (No intubation, No vasopressors, No feeding tube) Continue to treat the treatable Plan to discharge home with daughters Outpatient palliative follow up- TOC order placed PMT support as needed  Discussed with: bedside RN and Dr. [name]  Time Total: 90 minutes  Thank you for allowing [place] to participate in the care of Jennika ANOUK CRITZER PMT will continue to support holistically.   Signed by: Sarina Ser, NP Palliative Medicine Team  Team Phone # [phone] (Nights/Weekends)  05/21/2023, 12:50 PM

## 2023-05-21 NOTE — Progress Notes (Signed)
PROGRESS NOTE    [name] [name]  WUJ:[id number] DOB: 11-28-1933 DOA: 05/18/2023 PCP: [name], MD   Brief Narrative:  89 y.o. female with medical history significant for dementia, insulin-dependent T2DM, dermatomyositis (on Plaquenil and chronic prednisone 5 mg daily), PAD, history of syncope due to orthostasis and vasovagal episodes, history of seizure, hypothyroidism, macrocytic anemia, recent hospitalization from 1011 24-10 1624 for acute encephalopathy secondary to Klebsiella UTI treated with IV antibiotics presented with altered mental status.  On presentation, WBC was 20, lactic acid of 1.9.  COVID-19/influenza/RSV PCR negative.  UA negative for UTI.  Portable chest x-ray showed multifocal pulmonary infiltrates, right greater than left.  CTA chest and CT abdomen/pelvis with contrast were negative for PE but showed multifocal pneumonia involving the right upper and lower lobes.  She was started on IV fluids and antibiotics.  Assessment & Plan:   Multifocal right-sided pneumonia -Possibly has a component of aspiration as well in the background of encephalopathy and dementia. -Continue Rocephin and doxycycline.  Cultures negative so far.  Oxygen supplementation if needed.  Currently on room air.  Diet as per SLP recommendations: Apparently swallowing has recently worsened as per SLP evaluation. -Patient probably aspirated again last night and is already arousable this morning. -Start gentle hydration.  Acute metabolic encephalopathy Dementia -Presented with worsening of mental status possibly due to pneumonia.  Patient has baseline dementia.  Monitor mental status.  Fall precautions.  Delirium precautions.  PT following. -Hardly arousable responding.  Dermatomyositis Chronic debility with bed/wheelchair bound -Continue prednisone.  Plaquenil and methotrexate on hold for now.  Outpatient follow-up with rheumatology  Chronic bilateral great toes wounds -Follow wound care consult  recommendations.  X-rays of left and right great toes did not show any acute abnormality  Diabetes mellitus type 2 with hypoglycemia and hyperglycemia -Continue long-acting insulin.  Patient has had episodes of hypoglycemia overnight and hyperglycemia during the daytime. Continue CBGs with SSI.  Diabetes coordinator following.  Hypertension with history of orthostatic hypotension -Blood pressure currently on the higher side.  Monitor.  DC midodrine.  History of seizure -Continue Keppra.  Monitor to switch to IV if remains drowsy.  Peripheral artery disease -Status post angioplasty of bilateral common iliac artery stenosis in 01/2023.  Continue aspirin, Plavix, Zetia if able to swallow orally.  Leukocytosis -Resolved  Hypokalemia -Labs pending today  Hypomagnesemia -Replace.  Repeat a.m. labs.  Anemia of chronic disease/normocytic anemia -From chronic illnesses.  Hemoglobin stable.  Monitor intermittently  Hypothyroidism -Continue levothyroxine  Goals of care -Palliative care consulted as per family request.  Prognosis is poor.  DVT prophylaxis: Lovenox Code Status: DNR Family Communication: Daughter at bedside  Disposition Plan: Status is: Inpatient Remains inpatient appropriate because: Of severity of illness    Consultants: Palliative care  Procedures: None  Antimicrobials: Rocephin and doxycycline from 05/18/2023 onwards   Subjective: Patient seen and examined at bedside.  Drowsy, hardly wakes up.  No agitation, vomiting or fever reported.  Daughter concerned that patient might have aspirated again last night. Objective: Vitals:   05/20/23 0752 05/20/23 1622 05/20/23 2006 05/21/23 0044  BP: (!) 159/62 (!) 155/82 (!) 183/55 (!) 160/52  Pulse: 66 60 63 71  Resp: 16 16 18 17   Temp: 97.9 F (36.6 C) 98.4 F (36.9 C) 98.2 F (36.8 C) 97.9 F (36.6 C)  TempSrc: Oral Oral Oral   SpO2: 100% 92% 98% 100%  Weight:      Height:        Intake/Output Summary  (  Last 24 hours) at 05/21/2023 0719 Last data filed at 05/21/2023 0359 Gross per 24 hour  Intake 200 ml  Output --  Net 200 ml   Filed Weights   05/18/23 2028 05/18/23 2339  Weight: 54 kg 46.9 kg    Examination:  General: No acute distress.  Remains on room air.  Chronically ill and deconditioned looking.  Elderly female lying in bed. ENT/neck: No neck masses or JVD elevation noted  respiratory: Bilateral decreased breath sounds at bases with scattered crackles CVS: Rate mostly controlled; S1 and S2 are heard  abdominal: Soft, nontender, distended mildly; no organomegaly, normal bowel sounds heard  extremities: No clubbing; mild lower extremity edema present CNS: Drowsy, hardly wakes up.  \ Lymph: No palpable lymphadenopathy Skin: No obvious petechia/rashes psych: Showing no signs of agitation.  Flat affect. musculoskeletal: No obvious joint tenderness/erythema   Data Reviewed: I have personally reviewed following labs and imaging studies  CBC: Recent Labs  Lab 05/18/23 1623 05/19/23 0651 05/20/23 1052  WBC 20.0* 13.2* 8.1  NEUTROABS 18.5*  --  5.9  HGB 10.1*  10.9* 8.6* 9.3*  HCT 31.1*  32.0* 26.2* 27.8*  MCV 98.7 97.8 98.9  PLT 310 266 290   Basic Metabolic Panel: Recent Labs  Lab 05/18/23 1623 05/19/23 0651 05/20/23 1052  NA 135  134* 135 138  K 4.3  4.4 3.6 3.3*  CL 95* 100 101  CO2 30 25 25   GLUCOSE 184* 66* 224*  BUN 17 12 11   CREATININE 0.92 0.70 0.76  CALCIUM 10.3 9.7 9.6  MG  --   --  1.7   GFR: Estimated Creatinine Clearance: 36.7 mL/min (by C-G formula based on SCr of 0.76 mg/dL). Liver Function Tests: Recent Labs  Lab 05/18/23 1623  AST 35  ALT 37  ALKPHOS 79  BILITOT 0.6  PROT 6.9  ALBUMIN 3.2*   No results for input(s): "LIPASE", "AMYLASE" in the last 168 hours. Recent Labs  Lab 05/18/23 1640  AMMONIA <10   Coagulation Profile: Recent Labs  Lab 05/18/23 1623  INR 1.1   Cardiac Enzymes: Recent Labs  Lab 05/18/23 1623   CKTOTAL 19*   BNP (last 3 results) No results for input(s): "PROBNP" in the last 8760 hours. HbA1C: No results for input(s): "HGBA1C" in the last 72 hours. CBG: Recent Labs  Lab 05/20/23 0458 05/20/23 0751 05/20/23 1144 05/20/23 1652 05/20/23 2125  GLUCAP 69* 140* 235* 231* 257*   Lipid Profile: No results for input(s): "CHOL", "HDL", "LDLCALC", "TRIG", "CHOLHDL", "LDLDIRECT" in the last 72 hours. Thyroid Function Tests: Recent Labs    05/18/23 1623 05/18/23 1625  TSH  --  1.472  FREET4 1.19*  --    Anemia Panel: No results for input(s): "VITAMINB12", "FOLATE", "FERRITIN", "TIBC", "IRON", "RETICCTPCT" in the last 72 hours. Sepsis Labs: Recent Labs  Lab 05/18/23 1623 05/19/23 0651  PROCALCITON  --  1.01  LATICACIDVEN 1.9  --     Recent Results (from the past 240 hour(s))  Resp panel by RT-PCR (RSV, Flu A&B, Covid) Anterior Nasal Swab     Status: None   Collection Time: 05/18/23  3:43 PM   Specimen: Anterior Nasal Swab  Result Value Ref Range Status   SARS Coronavirus 2 by RT PCR NEGATIVE NEGATIVE Final   Influenza A by PCR NEGATIVE NEGATIVE Final   Influenza B by PCR NEGATIVE NEGATIVE Final    Comment: (NOTE) The Xpert Xpress SARS-CoV-2/FLU/RSV plus assay is intended as an aid in the diagnosis of influenza  from Nasopharyngeal swab specimens and should not be used as a sole basis for treatment. Nasal washings and aspirates are unacceptable for Xpert Xpress SARS-CoV-2/FLU/RSV testing.  Fact Sheet for Patients: BloggerCourse.com  Fact Sheet for Healthcare Providers: SeriousBroker.it  This test is not yet approved or cleared by the [place] FDA and has been authorized for detection and/or diagnosis of SARS-CoV-2 by FDA under an Emergency Use Authorization (EUA). This EUA will remain in effect (meaning this test can be used) for the duration of the COVID-19 declaration under Section 564(b)(1) of the Act,  21 U.S.C. section 360bbb-3(b)(1), unless the authorization is terminated or revoked.     Resp Syncytial Virus by PCR NEGATIVE NEGATIVE Final    Comment: (NOTE) Fact Sheet for Patients: BloggerCourse.com  Fact Sheet for Healthcare Providers: SeriousBroker.it  This test is not yet approved or cleared by the [place] FDA and has been authorized for detection and/or diagnosis of SARS-CoV-2 by FDA under an Emergency Use Authorization (EUA). This EUA will remain in effect (meaning this test can be used) for the duration of the COVID-19 declaration under Section 564(b)(1) of the Act, 21 U.S.C. section 360bbb-3(b)(1), unless the authorization is terminated or revoked.  Performed at [facility], [address], [place], [place] [address]   Blood Culture (routine x 2)     Status: None (Preliminary result)   Collection Time: 05/18/23  3:47 PM   Specimen: BLOOD  Result Value Ref Range Status   Specimen Description BLOOD SITE NOT SPECIFIED  Final   Special Requests   Final    BOTTLES DRAWN AEROBIC AND ANAEROBIC Blood Culture adequate volume   Culture   Final    NO GROWTH 2 DAYS Performed at [facility], [address], [place], [place] [address]    Report Status PENDING  Incomplete  Blood Culture (routine x 2)     Status: None (Preliminary result)   Collection Time: 05/18/23  4:26 PM   Specimen: BLOOD  Result Value Ref Range Status   Specimen Description BLOOD SITE NOT SPECIFIED  Final   Special Requests   Final    BOTTLES DRAWN AEROBIC AND ANAEROBIC Blood Culture adequate volume   Culture   Final    NO GROWTH 2 DAYS Performed at [facility], [address], [place], [place] [address]    Report Status PENDING  Incomplete         Radiology Studies: DG Swallowing Func-Speech Pathology  Result Date: 05/20/2023 Table formatting from the original result was not included. Modified Barium Swallow Study  Patient Details Name: CALIEGH MIDDLEKAUFF MRN: [id number] Date of Birth: September 22, 1933 Today's Date: 05/20/2023 HPI/PMH: HPI: Patient is an 89 y.o. female presented on 10/23 with AMS.  Portable chest x-ray revealed multifocal pulmonary infiltrates, right greater than left.  CTA chest and CT abdomen/pelvis with contrast were negative for PE but showed multifocal pneumonia involving the right upper and lower lobes. PMH significant for  insulin-dependent T2DM, dermatomyositis, PAD, history of syncope due to orthostasis and vasovagal episodes, seizure, hypothyroidism, and macrocytic anemia. Recent hospitalization from 10/11-10/16 for acute encephalopathy secondary to Klebsiella UTI. Chronic hx of progressive dysphagia; three prior MBS studies, each showing deteriorating function since the last. Clinical Impression: Clinical Impression: Ms. Stahl presents with deteriorating overall swallow function since last MBS in January of 2024.  There was notable aspiration of thin and nectar-thick liquids (generally without a cough response), and honey-thick liquids as well as purees were observed to enter the  laryngeal vestibule during the course of the study.  There was poor propulsion of all POs through the pharynx due to impaired base-of-tongue retraction, decreased pharyngeal squeeze, reduced mobility of the larynx, and poor epiglottic inversion.  These factors led to significant residue of all POs that remained despite attempts at an effortful swallow, use of chin tuck, and liquid washes.  There was aspiration of residue after the swallow and aspiration of liquids due to ineffective closure of the laryngeal vestibule.  Called pt's daughters after the study. They have been caring for their mother at home and have an excellent understanding of their mother's swallowing issues, the concerns around progressing dysphagia and the adverse impact of aspiration.  We discussed having further conversations with Palliative Medicine (they have  worked with Palliative Care and Home Hospice in the past; the experience with home hospice was quite negative).  They agree it would be helpful to gather more help in decision-making for the future. For now, continue a regular diet/thin liquids so Ms. Nowak' daughters can select foods their mom enjoys. They are here to feed her all her meals.  SLP will follow for further support/education as needed. Factors that may increase risk of adverse event in presence of aspiration Rubye Oaks & Clearance Coots 2021): Factors that may increase risk of adverse event in presence of aspiration Rubye Oaks & Clearance Coots 2021): Reduced cognitive function; Poor general health and/or compromised immunity; Frail or deconditioned; Dependence for feeding and/or oral hygiene; Weak cough; Aspiration of thick, dense, and/or acidic materials; Frequent aspiration of large volumes Recommendations/Plan: Swallowing Evaluation Recommendations Swallowing Evaluation Recommendations Recommendations: PO diet PO Diet Recommendation: Regular; Thin liquids (Level 0) Liquid Administration via: Cup; Straw Medication Administration: Crushed with puree Supervision: Full assist for feeding Swallowing strategies  : Slow rate; Small bites/sips Postural changes: Position pt fully upright for meals Oral care recommendations: Oral care BID (2x/day); Staff/trained caregiver to provide oral care Recommended consults: Consider Palliative care Treatment Plan Treatment Plan Treatment recommendations: Therapy as outlined in treatment plan below Follow-up recommendations: No SLP follow up Functional status assessment: Patient has had a recent decline in their functional status and demonstrates the ability to make significant improvements in function in a reasonable and predictable amount of time. Treatment frequency: Min 2x/week Treatment duration: 1 week Interventions: Diet toleration management by SLP; Patient/family education Recommendations Recommendations for follow up therapy are  one component of a multi-disciplinary discharge planning process, led by the attending physician.  Recommendations may be updated based on patient status, additional functional criteria and insurance authorization. Assessment: Orofacial Exam: Orofacial Exam Oral Cavity - Dentition: Dentures, top Anatomy: Anatomy: Suspected cervical osteophytes Boluses Administered: Boluses Administered Boluses Administered: Thin liquids (Level 0); Mildly thick liquids (Level 2, nectar thick); Moderately thick liquids (Level 3, honey thick); Puree  Oral Impairment Domain: Oral Impairment Domain Lip Closure: No labial escape Tongue control during bolus hold: Cohesive bolus between tongue to palatal seal Bolus preparation/mastication: Slow prolonged chewing/mashing with complete recollection Bolus transport/lingual motion: Delayed initiation of tongue motion (oral holding) Oral residue: Trace residue lining oral structures Location of oral residue : Tongue; Floor of mouth Initiation of pharyngeal swallow : Pyriform sinuses  Pharyngeal Impairment Domain: Pharyngeal Impairment Domain Soft palate elevation: No bolus between soft palate (SP)/pharyngeal wall (PW) Laryngeal elevation: Partial superior movement of thyroid cartilage/partial approximation of arytenoids to epiglottic petiole Anterior hyoid excursion: Partial anterior movement Epiglottic movement: Partial inversion Laryngeal vestibule closure: Incomplete, narrow column air/contrast in laryngeal vestibule Pharyngeal stripping wave : Present - diminished Pharyngeal contraction (  A/P view only): N/A Pharyngoesophageal segment opening: Partial distention/partial duration, partial obstruction of flow Tongue base retraction: Wide column of contrast or air between tongue base and PPW Pharyngeal residue: Majority of contrast within or on pharyngeal structures Location of pharyngeal residue: Valleculae; Pharyngeal wall; Pyriform sinuses  Esophageal Impairment Domain: Esophageal Impairment  Domain Esophageal clearance upright position: -- (NT) Pill: Pill Consistency administered: -- (NT) Penetration/Aspiration Scale Score: Penetration/Aspiration Scale Score 3.  Material enters airway, remains ABOVE vocal cords and not ejected out: Puree; Moderately thick liquids (Level 3, honey thick) 8.  Material enters airway, passes BELOW cords without attempt by patient to eject out (silent aspiration) : Thin liquids (Level 0); Mildly thick liquids (Level 2, nectar thick) Compensatory Strategies: Compensatory Strategies Compensatory strategies: Yes Straw: Ineffective Effortful swallow: Ineffective Chin tuck: Ineffective   General Information: Caregiver present: No  Diet Prior to this Study: Regular; Thin liquids (Level 0)   Temperature : Normal   No data recorded  Supplemental O2: None (Room air)   No data recorded Behavior/Cognition: Alert; Cooperative; Pleasant mood Self-Feeding Abilities: Needs set-up for self-feeding No data recorded Volitional Cough: Able to elicit Volitional Swallow: Able to elicit Exam Limitations: No limitations Goal Planning: Prognosis for improved oropharyngeal function: Guarded Barriers to Reach Goals: Severity of deficits No data recorded Patient/Family Stated Goal: none stated Consulted and agree with results and recommendations: Family member/caregiver Pain: Pain Assessment Pain Assessment: Faces Pain Score: 0 Faces Pain Scale: 0 End of Session: Start Time:SLP Start Time (ACUTE ONLY): 1012 Stop Time: SLP Stop Time (ACUTE ONLY): 1032 Time Calculation:SLP Time Calculation (min) (ACUTE ONLY): 20 min Charges: SLP Evaluations $ SLP Speech Visit: 1 Visit SLP Evaluations $BSS Swallow: 1 Procedure $MBS Swallow: 1 Procedure SLP visit diagnosis: SLP Visit Diagnosis: Dysphagia, oropharyngeal phase (R13.12) Past Medical History: Past Medical History: Diagnosis Date  Arthritis   "knees, legs" (06/10/2016)  Ascites   Chronic kidney disease   "related to my diabetes"  Chronic lower back pain    Dementia without behavioral disturbance (HCC)   Diabetes mellitus without complication (HCC)   Eczema   GERD (gastroesophageal reflux disease)   High cholesterol   Hyperlipidemia   Hypertension   Hypothyroidism   OAB (overactive bladder)   Osteoporosis   Thyroid disease   Type II diabetes mellitus (HCC)   Urticaria  Past Surgical History: Past Surgical History: Procedure Laterality Date  ABDOMINAL AORTOGRAM W/LOWER EXTREMITY N/A 02/08/2023  Procedure: ABDOMINAL AORTOGRAM W/LOWER EXTREMITY;  Surgeon: Chuck Hint, MD;  [facility] INVASIVE CV LAB;  Service: Vascular;  Laterality: N/A;  ABDOMINAL HYSTERECTOMY    KNEE ARTHROSCOPY    LAPAROTOMY N/A 12/03/2016  Procedure: EXPLORATORY LAPAROTOMY, LYSIS OF ADHESIONS;  Surgeon: Darnell Level, MD;  Location: [facility] ORS;  Service: General;  Laterality: N/A;  LOOP RECORDER INSERTION N/A 08/23/2019  Procedure: LOOP RECORDER INSERTION;  Surgeon: Marinus Maw, MD;  Location: [facility] INVASIVE CV LAB;  Service: Cardiovascular;  Laterality: N/A;  PERIPHERAL VASCULAR INTERVENTION  02/08/2023  Procedure: PERIPHERAL VASCULAR INTERVENTION;  Surgeon: Chuck Hint, MD;  Location: [facility] INVASIVE CV LAB;  Service: Vascular;;  vocal cord polyps   Carolan Shiver 05/20/2023, 12:25 PM       Scheduled Meds:  aspirin EC  81 mg Oral q AM   clopidogrel  75 mg Oral Q breakfast   doxycycline  100 mg Oral BID   enoxaparin (LOVENOX) injection  40 mg Subcutaneous Q24H   ezetimibe  10 mg Oral QHS   famotidine  20 mg  Oral QHS   feeding supplement  237 mL Oral BID BM   ferrous sulfate  300 mg Oral Once per day on Monday Wednesday Friday   folic acid  1 mg Oral QHS   insulin aspart  0-5 Units Subcutaneous QHS   insulin aspart  0-9 Units Subcutaneous TID WC   insulin glargine-yfgn  5 Units Subcutaneous Daily   levETIRAcetam  500 mg Oral BID   levothyroxine  100 mcg Oral Q0600   midodrine  5 mg Oral TID WC   multivitamin with minerals  1 tablet Oral Daily   predniSONE  5  mg Oral Q breakfast   Continuous Infusions:  cefTRIAXone (ROCEPHIN)  IV Stopped (05/20/23 0944)          Glade Lloyd, MD [facility] 05/21/2023, 7:19 AM

## 2023-05-22 DIAGNOSIS — Z515 Encounter for palliative care: Secondary | ICD-10-CM | POA: Diagnosis not present

## 2023-05-22 DIAGNOSIS — Z7189 Other specified counseling: Secondary | ICD-10-CM | POA: Diagnosis not present

## 2023-05-22 DIAGNOSIS — J189 Pneumonia, unspecified organism: Secondary | ICD-10-CM | POA: Diagnosis not present

## 2023-05-22 LAB — CBC WITH DIFFERENTIAL/PLATELET
Abs Immature Granulocytes: 0.04 10*3/uL (ref 0.00–0.07)
Basophils Absolute: 0 10*3/uL (ref 0.0–0.1)
Basophils Relative: 0 %
Eosinophils Absolute: 0.2 10*3/uL (ref 0.0–0.5)
Eosinophils Relative: 5 %
HCT: 28.8 % — ABNORMAL LOW (ref 36.0–46.0)
Hemoglobin: 9.5 g/dL — ABNORMAL LOW (ref 12.0–15.0)
Immature Granulocytes: 1 %
Lymphocytes Relative: 22 %
Lymphs Abs: 0.9 10*3/uL (ref 0.7–4.0)
MCH: 32.4 pg (ref 26.0–34.0)
MCHC: 33 g/dL (ref 30.0–36.0)
MCV: 98.3 fL (ref 80.0–100.0)
Monocytes Absolute: 0.3 10*3/uL (ref 0.1–1.0)
Monocytes Relative: 7 %
Neutro Abs: 2.6 10*3/uL (ref 1.7–7.7)
Neutrophils Relative %: 65 %
Platelets: 309 10*3/uL (ref 150–400)
RBC: 2.93 MIL/uL — ABNORMAL LOW (ref 3.87–5.11)
RDW: 14.5 % (ref 11.5–15.5)
WBC: 4.1 10*3/uL (ref 4.0–10.5)
nRBC: 0 % (ref 0.0–0.2)

## 2023-05-22 LAB — BASIC METABOLIC PANEL
Anion gap: 9 (ref 5–15)
BUN: 7 mg/dL — ABNORMAL LOW (ref 8–23)
CO2: 23 mmol/L (ref 22–32)
Calcium: 9.5 mg/dL (ref 8.9–10.3)
Chloride: 102 mmol/L (ref 98–111)
Creatinine, Ser: 0.59 mg/dL (ref 0.44–1.00)
GFR, Estimated: >60 mL/min (ref >60)
Glucose, Bld: 131 mg/dL — ABNORMAL HIGH (ref 70–99)
Potassium: 4 mmol/L (ref 3.5–5.1)
Sodium: 134 mmol/L — ABNORMAL LOW (ref 135–145)

## 2023-05-22 LAB — GLUCOSE, CAPILLARY
Glucose-Capillary: 118 mg/dL — ABNORMAL HIGH (ref 70–99)
Glucose-Capillary: 128 mg/dL — ABNORMAL HIGH (ref 70–99)
Glucose-Capillary: 134 mg/dL — ABNORMAL HIGH (ref 70–99)
Glucose-Capillary: 186 mg/dL — ABNORMAL HIGH (ref 70–99)
Glucose-Capillary: 265 mg/dL — ABNORMAL HIGH (ref 70–99)
Glucose-Capillary: 398 mg/dL — ABNORMAL HIGH (ref 70–99)
Glucose-Capillary: 93 mg/dL (ref 70–99)

## 2023-05-22 LAB — MAGNESIUM: Magnesium: 2 mg/dL (ref 1.7–2.4)

## 2023-05-22 NOTE — Progress Notes (Addendum)
PROGRESS NOTE    [name] [name]  JWJ:[id number] DOB: 1934/05/02 DOA: 05/18/2023 PCP: [name], MD   Brief Narrative:  89 y.o. female with medical history significant for dementia, insulin-dependent T2DM, dermatomyositis (on Plaquenil and chronic prednisone 5 mg daily), PAD, history of syncope due to orthostasis and vasovagal episodes, history of seizure, hypothyroidism, macrocytic anemia, recent hospitalization from 1011 24-10 1624 for acute encephalopathy secondary to Klebsiella UTI treated with IV antibiotics presented with altered mental status.  On presentation, WBC was 20, lactic acid of 1.9.  COVID-19/influenza/RSV PCR negative.  UA negative for UTI.  Portable chest x-ray showed multifocal pulmonary infiltrates, right greater than left.  CTA chest and CT abdomen/pelvis with contrast were negative for PE but showed multifocal pneumonia involving the right upper and lower lobes.  She was started on IV fluids and antibiotics.  Assessment & Plan:   Multifocal right-sided pneumonia -Possibly has a component of aspiration as well in the background of encephalopathy and dementia. -Continue Rocephin and doxycycline: Finish 7-day course of therapy.  Cultures negative so far.  Oxygen supplementation if needed.  Currently on room air.  Diet as per SLP recommendations: Apparently swallowing has recently worsened as per SLP evaluation.  SLP currently recommending n.p.o. because of worsening mental status.  Patient is more awake this morning: Will consult SLP. -Continue gentle hydration.  Acute metabolic encephalopathy Dementia -Presented with worsening of mental status possibly due to pneumonia.  Patient has baseline dementia.  Monitor mental status.  Fall precautions.  Delirium precautions.  PT following. -More awake this morning and answering some questions.  Dermatomyositis Chronic debility with bed/wheelchair bound -Continue prednisone.  Plaquenil and methotrexate on hold for now.   Outpatient follow-up with rheumatology  Chronic bilateral great toes wounds -Follow wound care consult recommendations.  X-rays of left and right great toes did not show any acute abnormality  Diabetes mellitus type 2 with hypoglycemia and hyperglycemia -Hold long-acting insulin dose this morning.  Patient has had episodes of hypoglycemia overnight and hyperglycemia during the daytime. Continue CBGs with SSI.  Diabetes coordinator following.  Hypertension with history of orthostatic hypotension -Blood pressure currently on the higher side.  Monitor.  DC'd midodrine.  History of seizure -Continue Keppra.   Peripheral artery disease -Status post angioplasty of bilateral common iliac artery stenosis in 01/2023.  Continue aspirin, Plavix, Zetia if able to swallow orally.  Leukocytosis -Resolved  Hypokalemia -Labs pending today  Hypomagnesemia -Labs pending today  Anemia of chronic disease/normocytic anemia -From chronic illnesses.  Hemoglobin stable.  Monitor intermittently  Hypothyroidism -Continue levothyroxine  Goals of care -Palliative care following.  Family want to continue current course of care.  Will need outpatient follow-up with palliative care.  Severe malnutrition -Follow nutrition recommendations  Stage II mid sacrum pressure injury: Present on admission -follow wound care consult recommendations    DVT prophylaxis: Lovenox Code Status: DNR Family Communication: Daughter at bedside  Disposition Plan: Status is: Inpatient Remains inpatient appropriate because: Of severity of illness    Consultants: Palliative care  Procedures: None  Antimicrobials: Rocephin and doxycycline from 05/18/2023 onwards   Subjective: Patient seen and examined at bedside.  No agitation, fever, vomiting reported.  Daughter at bedside.  objective: Vitals:   05/21/23 1729 05/21/23 2033 05/22/23 0019 05/22/23 0416  BP: 138/67 (!) 154/59 (!) 173/74 (!) 164/62  Pulse: (!) 50  (!) 51 (!) 56 (!) 56  Resp: 17 18 18 18   Temp:  97.8 F (36.6 C) 97.7 F (36.5 C) 98 F (  36.7 C)  TempSrc:  Oral Oral Oral  SpO2: 96% 100% 100% 100%  Weight:      Height:        Intake/Output Summary (Last 24 hours) at 05/22/2023 0719 Last data filed at 05/22/2023 0705 Gross per 24 hour  Intake 328.92 ml  Output 1300 ml  Net -971.08 ml   Filed Weights   05/18/23 2028 05/18/23 2339  Weight: 54 kg 46.9 kg    Examination:  General: On room air currently.  No distress.  Chronically ill and deconditioned looking.  Elderly female lying in bed. ENT/neck: No obvious JVD elevation or palpable neck masses noted respiratory: Decreased breath sounds at bases bilaterally with some crackles  CVS: S1 and S2 heard; bradycardic abdominal: Soft, nontender, has some distention; no organomegaly, bowel sounds are heard  extremities: Trace lower extremity edema; no cyanosis  CNS: Awake, answers some questions, slow to respond, poor historian. Lymph: No palpable lymphadenopathy Skin: No obvious ecchymosis/lesions  psych: Currently not agitated.  Affect is extremely flat.   Musculoskeletal: No obvious joint swelling/erythema  Data Reviewed: I have personally reviewed following labs and imaging studies  CBC: Recent Labs  Lab 05/18/23 1623 05/19/23 0651 05/20/23 1052 05/21/23 0558  WBC 20.0* 13.2* 8.1 7.0  NEUTROABS 18.5*  --  5.9 5.2  HGB 10.1*  10.9* 8.6* 9.3* 9.6*  HCT 31.1*  32.0* 26.2* 27.8* 28.9*  MCV 98.7 97.8 98.9 97.3  PLT 310 266 290 297   Basic Metabolic Panel: Recent Labs  Lab 05/18/23 1623 05/19/23 0651 05/20/23 1052 05/21/23 0558  NA 135  134* 135 138 138  K 4.3  4.4 3.6 3.3* 3.3*  CL 95* 100 101 103  CO2 30 25 25 27   GLUCOSE 184* 66* 224* 112*  BUN 17 12 11 9   CREATININE 0.92 0.70 0.76 0.70  CALCIUM 10.3 9.7 9.6 9.4  MG  --   --  1.7 1.6*   GFR: Estimated Creatinine Clearance: 36.7 mL/min (by C-G formula based on SCr of 0.7 mg/dL). Liver Function  Tests: Recent Labs  Lab 05/18/23 1623 05/21/23 0558  AST 35 38  ALT 37 37  ALKPHOS 79 82  BILITOT 0.6 <0.1*  PROT 6.9 6.3*  ALBUMIN 3.2* 2.9*   No results for input(s): "LIPASE", "AMYLASE" in the last 168 hours. Recent Labs  Lab 05/18/23 1640  AMMONIA <10   Coagulation Profile: Recent Labs  Lab 05/18/23 1623  INR 1.1   Cardiac Enzymes: Recent Labs  Lab 05/18/23 1623  CKTOTAL 19*   BNP (last 3 results) No results for input(s): "PROBNP" in the last 8760 hours. HbA1C: No results for input(s): "HGBA1C" in the last 72 hours. CBG: Recent Labs  Lab 05/21/23 1754 05/21/23 1836 05/21/23 2037 05/22/23 0032 05/22/23 0420  GLUCAP 95 105* 108* 128* 93   Lipid Profile: No results for input(s): "CHOL", "HDL", "LDLCALC", "TRIG", "CHOLHDL", "LDLDIRECT" in the last 72 hours. Thyroid Function Tests: No results for input(s): "TSH", "T4TOTAL", "FREET4", "T3FREE", "THYROIDAB" in the last 72 hours.  Anemia Panel: No results for input(s): "VITAMINB12", "FOLATE", "FERRITIN", "TIBC", "IRON", "RETICCTPCT" in the last 72 hours. Sepsis Labs: Recent Labs  Lab 05/18/23 1623 05/19/23 0651  PROCALCITON  --  1.01  LATICACIDVEN 1.9  --     Recent Results (from the past 240 hour(s))  Resp panel by RT-PCR (RSV, Flu A&B, Covid) Anterior Nasal Swab     Status: None   Collection Time: 05/18/23  3:43 PM   Specimen: Anterior Nasal Swab  Result  Value Ref Range Status   SARS Coronavirus 2 by RT PCR NEGATIVE NEGATIVE Final   Influenza A by PCR NEGATIVE NEGATIVE Final   Influenza B by PCR NEGATIVE NEGATIVE Final    Comment: (NOTE) The Xpert Xpress SARS-CoV-2/FLU/RSV plus assay is intended as an aid in the diagnosis of influenza from Nasopharyngeal swab specimens and should not be used as a sole basis for treatment. Nasal washings and aspirates are unacceptable for Xpert Xpress SARS-CoV-2/FLU/RSV testing.  Fact Sheet for Patients: BloggerCourse.com  Fact Sheet  for Healthcare Providers: SeriousBroker.it  This test is not yet approved or cleared by the [place] FDA and has been authorized for detection and/or diagnosis of SARS-CoV-2 by FDA under an Emergency Use Authorization (EUA). This EUA will remain in effect (meaning this test can be used) for the duration of the COVID-19 declaration under Section 564(b)(1) of the Act, 21 U.S.C. section 360bbb-3(b)(1), unless the authorization is terminated or revoked.     Resp Syncytial Virus by PCR NEGATIVE NEGATIVE Final    Comment: (NOTE) Fact Sheet for Patients: BloggerCourse.com  Fact Sheet for Healthcare Providers: SeriousBroker.it  This test is not yet approved or cleared by the [place] FDA and has been authorized for detection and/or diagnosis of SARS-CoV-2 by FDA under an Emergency Use Authorization (EUA). This EUA will remain in effect (meaning this test can be used) for the duration of the COVID-19 declaration under Section 564(b)(1) of the Act, 21 U.S.C. section 360bbb-3(b)(1), unless the authorization is terminated or revoked.  Performed at [facility], [address] [place], [place] [address]   Blood Culture (routine x 2)     Status: None (Preliminary result)   Collection Time: 05/18/23  3:47 PM   Specimen: BLOOD  Result Value Ref Range Status   Specimen Description BLOOD SITE NOT SPECIFIED  Final   Special Requests   Final    BOTTLES DRAWN AEROBIC AND ANAEROBIC Blood Culture adequate volume   Culture   Final    NO GROWTH 3 DAYS Performed at [facility], [address] [place], [place] [address]    Report Status PENDING  Incomplete  Blood Culture (routine x 2)     Status: None (Preliminary result)   Collection Time: 05/18/23  4:26 PM   Specimen: BLOOD  Result Value Ref Range Status   Specimen Description BLOOD SITE NOT SPECIFIED  Final   Special Requests   Final    BOTTLES  DRAWN AEROBIC AND ANAEROBIC Blood Culture adequate volume   Culture   Final    NO GROWTH 3 DAYS Performed at [facility], [address], [place], [place] [address]    Report Status PENDING  Incomplete         Radiology Studies: DG CHEST PORT 1 VIEW  Result Date: 05/21/2023 CLINICAL DATA:  Dyspnea EXAM: PORTABLE CHEST 1 VIEW COMPARISON:  Chest radiograph and CTA chest 3 days prior FINDINGS: The loop recorder is again seen. The cardiomediastinal silhouette is stable. There are persistent but slightly improved patchy opacities in the right upper lobe. There is no new or worsening focal airspace opacity. There is no pulmonary edema. There is no pleural effusion or pneumothorax There is no acute osseous abnormality. IMPRESSION: Persistent but slightly improved opacities in the right upper lobe. No new or worsening focal airspace opacity. Electronically Signed   By: Lesia Hausen M.D.   On: 05/21/2023 13:05   DG Swallowing Func-Speech Pathology  Result Date: 05/20/2023 Table formatting from  the original result was not included. Modified Barium Swallow Study Patient Details Name: CAMDYN LADEN MRN: [id number] Date of Birth: [birth year] Today's Date: 05/20/2023 HPI/PMH: HPI: Patient is an 89 y.o. female presented on 10/23 with AMS.  Portable chest x-ray revealed multifocal pulmonary infiltrates, right greater than left.  CTA chest and CT abdomen/pelvis with contrast were negative for PE but showed multifocal pneumonia involving the right upper and lower lobes. PMH significant for  insulin-dependent T2DM, dermatomyositis, PAD, history of syncope due to orthostasis and vasovagal episodes, seizure, hypothyroidism, and macrocytic anemia. Recent hospitalization from 10/11-10/16 for acute encephalopathy secondary to Klebsiella UTI. Chronic hx of progressive dysphagia; three prior MBS studies, each showing deteriorating function since the last. Clinical Impression: Clinical Impression: Ms. Delisle presents  with deteriorating overall swallow function since last MBS in January of 2024.  There was notable aspiration of thin and nectar-thick liquids (generally without a cough response), and honey-thick liquids as well as purees were observed to enter the laryngeal vestibule during the course of the study.  There was poor propulsion of all POs through the pharynx due to impaired base-of-tongue retraction, decreased pharyngeal squeeze, reduced mobility of the larynx, and poor epiglottic inversion.  These factors led to significant residue of all POs that remained despite attempts at an effortful swallow, use of chin tuck, and liquid washes.  There was aspiration of residue after the swallow and aspiration of liquids due to ineffective closure of the laryngeal vestibule.  Called pt's daughters after the study. They have been caring for their mother at home and have an excellent understanding of their mother's swallowing issues, the concerns around progressing dysphagia and the adverse impact of aspiration.  We discussed having further conversations with Palliative Medicine (they have worked with Palliative Care and Home Hospice in the past; the experience with home hospice was quite negative).  They agree it would be helpful to gather more help in decision-making for the future. For now, continue a regular diet/thin liquids so Ms. Hanssen' daughters can select foods their mom enjoys. They are here to feed her all her meals.  SLP will follow for further support/education as needed. Factors that may increase risk of adverse event in presence of aspiration Rubye Oaks & Clearance Coots 2021): Factors that may increase risk of adverse event in presence of aspiration Rubye Oaks & Clearance Coots 2021): Reduced cognitive function; Poor general health and/or compromised immunity; Frail or deconditioned; Dependence for feeding and/or oral hygiene; Weak cough; Aspiration of thick, dense, and/or acidic materials; Frequent aspiration of large volumes  Recommendations/Plan: Swallowing Evaluation Recommendations Swallowing Evaluation Recommendations Recommendations: PO diet PO Diet Recommendation: Regular; Thin liquids (Level 0) Liquid Administration via: Cup; Straw Medication Administration: Crushed with puree Supervision: Full assist for feeding Swallowing strategies  : Slow rate; Small bites/sips Postural changes: Position pt fully upright for meals Oral care recommendations: Oral care BID (2x/day); Staff/trained caregiver to provide oral care Recommended consults: Consider Palliative care Treatment Plan Treatment Plan Treatment recommendations: Therapy as outlined in treatment plan below Follow-up recommendations: No SLP follow up Functional status assessment: Patient has had a recent decline in their functional status and demonstrates the ability to make significant improvements in function in a reasonable and predictable amount of time. Treatment frequency: Min 2x/week Treatment duration: 1 week Interventions: Diet toleration management by SLP; Patient/family education Recommendations Recommendations for follow up therapy are one component of a multi-disciplinary discharge planning process, led by the attending physician.  Recommendations may be updated based on patient status, additional functional criteria and insurance  authorization. Assessment: Orofacial Exam: Orofacial Exam Oral Cavity - Dentition: Dentures, top Anatomy: Anatomy: Suspected cervical osteophytes Boluses Administered: Boluses Administered Boluses Administered: Thin liquids (Level 0); Mildly thick liquids (Level 2, nectar thick); Moderately thick liquids (Level 3, honey thick); Puree  Oral Impairment Domain: Oral Impairment Domain Lip Closure: No labial escape Tongue control during bolus hold: Cohesive bolus between tongue to palatal seal Bolus preparation/mastication: Slow prolonged chewing/mashing with complete recollection Bolus transport/lingual motion: Delayed initiation of tongue  motion (oral holding) Oral residue: Trace residue lining oral structures Location of oral residue : Tongue; Floor of mouth Initiation of pharyngeal swallow : Pyriform sinuses  Pharyngeal Impairment Domain: Pharyngeal Impairment Domain Soft palate elevation: No bolus between soft palate (SP)/pharyngeal wall (PW) Laryngeal elevation: Partial superior movement of thyroid cartilage/partial approximation of arytenoids to epiglottic petiole Anterior hyoid excursion: Partial anterior movement Epiglottic movement: Partial inversion Laryngeal vestibule closure: Incomplete, narrow column air/contrast in laryngeal vestibule Pharyngeal stripping wave : Present - diminished Pharyngeal contraction (A/P view only): N/A Pharyngoesophageal segment opening: Partial distention/partial duration, partial obstruction of flow Tongue base retraction: Wide column of contrast or air between tongue base and PPW Pharyngeal residue: Majority of contrast within or on pharyngeal structures Location of pharyngeal residue: Valleculae; Pharyngeal wall; Pyriform sinuses  Esophageal Impairment Domain: Esophageal Impairment Domain Esophageal clearance upright position: -- (NT) Pill: Pill Consistency administered: -- (NT) Penetration/Aspiration Scale Score: Penetration/Aspiration Scale Score 3.  Material enters airway, remains ABOVE vocal cords and not ejected out: Puree; Moderately thick liquids (Level 3, honey thick) 8.  Material enters airway, passes BELOW cords without attempt by patient to eject out (silent aspiration) : Thin liquids (Level 0); Mildly thick liquids (Level 2, nectar thick) Compensatory Strategies: Compensatory Strategies Compensatory strategies: Yes Straw: Ineffective Effortful swallow: Ineffective Chin tuck: Ineffective   General Information: Caregiver present: No  Diet Prior to this Study: Regular; Thin liquids (Level 0)   Temperature : Normal   No data recorded  Supplemental O2: None (Room air)   No data recorded  Behavior/Cognition: Alert; Cooperative; Pleasant mood Self-Feeding Abilities: Needs set-up for self-feeding No data recorded Volitional Cough: Able to elicit Volitional Swallow: Able to elicit Exam Limitations: No limitations Goal Planning: Prognosis for improved oropharyngeal function: Guarded Barriers to Reach Goals: Severity of deficits No data recorded Patient/Family Stated Goal: none stated Consulted and agree with results and recommendations: Family member/caregiver Pain: Pain Assessment Pain Assessment: Faces Pain Score: 0 Faces Pain Scale: 0 End of Session: Start Time:SLP Start Time (ACUTE ONLY): 1012 Stop Time: SLP Stop Time (ACUTE ONLY): 1032 Time Calculation:SLP Time Calculation (min) (ACUTE ONLY): 20 min Charges: SLP Evaluations $ SLP Speech Visit: 1 Visit SLP Evaluations $BSS Swallow: 1 Procedure $MBS Swallow: 1 Procedure SLP visit diagnosis: SLP Visit Diagnosis: Dysphagia, oropharyngeal phase (R13.12) Past Medical History: Past Medical History: Diagnosis Date  Arthritis   "knees, legs" (06/10/2016)  Ascites   Chronic kidney disease   "related to my diabetes"  Chronic lower back pain   Dementia without behavioral disturbance (HCC)   Diabetes mellitus without complication (HCC)   Eczema   GERD (gastroesophageal reflux disease)   High cholesterol   Hyperlipidemia   Hypertension   Hypothyroidism   OAB (overactive bladder)   Osteoporosis   Thyroid disease   Type II diabetes mellitus (HCC)   Urticaria  Past Surgical History: Past Surgical History: Procedure Laterality Date  ABDOMINAL AORTOGRAM W/LOWER EXTREMITY N/A 02/08/2023  Procedure: ABDOMINAL AORTOGRAM W/LOWER EXTREMITY;  Surgeon: Chuck Hint, MD;  Location: [facility] INVASIVE CV  LAB;  Service: Vascular;  Laterality: N/A;  ABDOMINAL HYSTERECTOMY    KNEE ARTHROSCOPY    LAPAROTOMY N/A 12/03/2016  Procedure: EXPLORATORY LAPAROTOMY, LYSIS OF ADHESIONS;  Surgeon: Darnell Level, MD;  Location: [facility] ORS;  Service: General;  Laterality: N/A;  LOOP RECORDER  INSERTION N/A 08/23/2019  Procedure: LOOP RECORDER INSERTION;  Surgeon: Marinus Maw, MD;  Location: [facility] INVASIVE CV LAB;  Service: Cardiovascular;  Laterality: N/A;  PERIPHERAL VASCULAR INTERVENTION  02/08/2023  Procedure: PERIPHERAL VASCULAR INTERVENTION;  Surgeon: Chuck Hint, MD;  Location: [facility] INVASIVE CV LAB;  Service: Vascular;;  vocal cord polyps   Carolan Shiver 05/20/2023, 12:25 PM       Scheduled Meds:  aspirin EC  81 mg Oral q AM   clopidogrel  75 mg Oral Q breakfast   doxycycline  100 mg Oral BID   enoxaparin (LOVENOX) injection  40 mg Subcutaneous Q24H   ezetimibe  10 mg Oral QHS   famotidine  20 mg Oral QHS   feeding supplement  237 mL Oral BID BM   ferrous sulfate  300 mg Oral Once per day on Monday Wednesday Friday   folic acid  1 mg Oral QHS   insulin aspart  0-6 Units Subcutaneous Q4H   insulin glargine-yfgn  5 Units Subcutaneous Daily   levETIRAcetam  500 mg Oral BID   levothyroxine  100 mcg Oral Q0600   multivitamin with minerals  1 tablet Oral Daily   predniSONE  5 mg Oral Q breakfast   Continuous Infusions:  sodium chloride 50 mL/hr at 05/21/23 1456   cefTRIAXone (ROCEPHIN)  IV Stopped (05/21/23 1115)   potassium chloride            Glade Lloyd, MD [facility] 05/22/2023, 7:19 AM

## 2023-05-22 NOTE — Progress Notes (Signed)
Speech Language Pathology Treatment: Dysphagia  Patient Details Name: [name] [name] MRN: [id number] DOB: [birth year] Today's Date: 05/22/2023 Time: 1300-1308 SLP Time Calculation (min) (ACUTE ONLY): 8 min  Assessment / Plan / Recommendation Clinical Impression  SLP returned for education on diet change and POC. Upon arrival the pt was lethargic in bed, pt's daughter present. SLP reviewed success with PO trials for BSE earlier today and diet upgrade. [name] agreeable to upgrade from NPO to regular/thin liquid. SLP confirmed family intentions to be solely responsible for feeding assist with meals. Per daughter's reports, the pt's family have a schedule for every meal to ensure adequate nutritional needs met. [name] verbally confirms being aware of the risks of PO intake with her mother's decreased swallowing function per most recent MBSS. SLP reviewed signs of thrush and importance of consistent oral care and the pt's reports of lose dentures. The pt's daughter appears to have great insight into the POC for dysphagia management. SLP to f/u in a few days with diet tolerance and success of carryover for SLP POC.    HPI HPI: Patient is an 89 y.o. female presented on 10/23 with AMS.  Portable chest x-ray revealed multifocal pulmonary infiltrates, right greater than left.  CTA chest and CT abdomen/pelvis with contrast were negative for PE but showed multifocal pneumonia involving the right upper and lower lobes. PMH significant for  insulin-dependent T2DM, dermatomyositis, PAD, history of syncope due to orthostasis and vasovagal episodes, seizure, hypothyroidism, and macrocytic anemia. Recent hospitalization from 10/11-10/16 for acute encephalopathy secondary to Klebsiella UTI. Chronic hx of progressive dysphagia; three prior MBS studies, each showing deteriorating function since the last.      SLP Plan         Recommendations for follow up therapy are one component of a multi-disciplinary  discharge planning process, led by the attending physician.  Recommendations may be updated based on patient status, additional functional criteria and insurance authorization.    Recommendations  Diet recommendations: Regular;Thin liquid Medication Administration: Crushed with puree Compensations: Minimize environmental distractions;Small sips/bites;Slow rate                  Oral care QID     Dysphagia, oropharyngeal phase (R13.12)          Dione Housekeeper M.S. CCC-SLP

## 2023-05-22 NOTE — Progress Notes (Signed)
Daily Progress Note   Patient Name: [name] [name]       Date: 05/22/2023 DOB: 1934-05-18  Age: 89 y.o. MRN#: [id number] Attending Physician: Glade Lloyd, MD Primary Care Physician: [name], MD Admit Date: 05/18/2023  Reason for Consultation/Follow-up: Establishing goals of care   Length of Stay: 4  Current Medications: Scheduled Meds:   aspirin EC  81 mg Oral q AM   clopidogrel  75 mg Oral Q breakfast   doxycycline  100 mg Oral BID   enoxaparin (LOVENOX) injection  40 mg Subcutaneous Q24H   ezetimibe  10 mg Oral QHS   famotidine  20 mg Oral QHS   feeding supplement  237 mL Oral BID BM   ferrous sulfate  300 mg Oral Once per day on Monday Wednesday Friday   folic acid  1 mg Oral QHS   insulin aspart  0-6 Units Subcutaneous Q4H   insulin glargine-yfgn  5 Units Subcutaneous Daily   levETIRAcetam  500 mg Oral BID   levothyroxine  100 mcg Oral Q0600   multivitamin with minerals  1 tablet Oral Daily   predniSONE  5 mg Oral Q breakfast    Continuous Infusions:  sodium chloride 50 mL/hr at 05/22/23 0905   cefTRIAXone (ROCEPHIN)  IV 2 g (05/22/23 0927)   potassium chloride      PRN Meds: acetaminophen **OR** acetaminophen, dextrose, ondansetron **OR** ondansetron (ZOFRAN) IV, senna-docusate  Physical Exam Vitals reviewed.  Constitutional:      Appearance: She is ill-appearing.  Cardiovascular:     Rate and Rhythm: Normal rate.  Pulmonary:     Effort: Pulmonary effort is normal.  Skin:    General: Skin is warm and dry.  Neurological:     Mental Status: She is alert.  Psychiatric:        Mood and Affect: Mood normal.        Behavior: Behavior normal.        Thought Content: Thought content normal.             Vital Signs: BP (!) 187/67 (BP Location: Right  Arm)   Pulse 67   Temp (!) 97.4 F (36.3 C) (Oral)   Resp 12   Ht 5\' 7"  (1.702 m)   Wt 46.9 kg   SpO2 100%   BMI 16.19  kg/m  SpO2: SpO2: 100 % O2 Device: O2 Device: Room Air O2 Flow Rate:    Intake/output summary:  Intake/Output Summary (Last 24 hours) at 05/22/2023 1021 Last data filed at 05/22/2023 5284 Gross per 24 hour  Intake 1233.46 ml  Output 1300 ml  Net -66.54 ml   LBM: Last BM Date : 05/21/23 Baseline Weight: Weight: 54 kg (from 05/01/23) Most recent weight: Weight: 46.9 kg     Patient Active Problem List   Diagnosis Date Noted   Multifocal pneumonia 05/18/2023   Acute metabolic encephalopathy 04/30/2023   Insulin dependent type 2 diabetes mellitus (HCC) 04/30/2023   Macrocytic anemia 04/30/2023   CAP (community acquired pneumonia) 03/16/2023   COVID-19 02/24/2023   Weakness 02/24/2023   AMS (altered mental status) 02/15/2023   Paraparesis (HCC) 01/04/2023   Other specified abnormal findings of blood chemistry 01/04/2023   Cervical radiculitis 01/04/2023   Cervical spinal stenosis 01/04/2023   UTI (urinary tract infection) 10/03/2022   Sepsis due to urinary tract infection (HCC) 10/02/2022   Normocytic anemia 07/27/2022   Dementia without behavioral disturbance (HCC) 07/27/2022   Malnutrition of moderate degree 05/11/2022   PNA (pneumonia) 01/19/2022   Syncope 01/13/2022   Syncope and collapse 09/13/2021   Dermatopolymyositis, unspecified, organ involvement unspecified (HCC) 09/13/2021   Poor appetite 09/13/2021   Hyponatremia 09/13/2021   Uncontrolled diabetes mellitus with hypoglycemia without coma, with long-term current use of insulin (HCC) 09/13/2021   Sacral ulcer (HCC) 09/13/2021   Oropharyngeal dysphagia 05/15/2021   Allergic conjunctivitis of both eyes 03/15/2020   Peripheral artery disease (HCC) 10/24/2019   Malnutrition (HCC) 10/10/2019   History of seizure 10/08/2019   Right shoulder pain 08/21/2019   Leukocytosis 08/21/2019   Acute  encephalopathy 08/18/2019   Enterocolitis 08/18/2019   Hyperglycemia 08/18/2019   Chronic kidney disease    Pressure injury of skin 12/09/2016   SBO (small bowel obstruction) s/p lysis of adhesions 12/03/2016 12/04/2016   Acute urinary retention 11/27/2016   Ascites 11/27/2016   Abdominal distension    Facial droop    Hyperkalemia 11/25/2016   Sepsis (HCC) 11/25/2016   Acute renal failure superimposed on stage 2 chronic kidney disease (HCC) 11/25/2016   Delirium 11/25/2016   Age-related osteoporosis without current pathological fracture 10/01/2016   GERD (gastroesophageal reflux disease) 06/10/2016   Insomnia 06/10/2016   Viral gastroenteritis    Right hip pain 05/06/2015   Vaginal atrophy 11/01/2013   ECZEMA 07/24/2008   INTERMITTENT VERTIGO 07/24/2008   Constipation 11/16/2007   INSOMNIA 08/12/2007   Oral thrush 08/03/2007   Hypertension with hx of orthostatic hypotension 02/14/2007   Allergic rhinitis due to allergen 02/14/2007   Hypothyroidism 01/26/2007   Uncontrolled type 2 diabetes mellitus with hypoglycemia, with long-term current use of insulin (HCC) 01/26/2007   GERD 01/26/2007   DEGENERATIVE JOINT DISEASE 01/26/2007    Palliative Care Assessment & Plan   Patient Profile: 89 y.o. female  with past medical history of dementia, insulin-dependent T2DM, dermatomyositis (on Plaquenil and chronic prednisone 5 mg daily), PAD, history of syncope due to orthostasis and vasovagal episodes, history of seizure, hypothyroidism, macrocytic anemia, recent hospitalization from 04/30/23 -10/ 1624 for acute encephalopathy secondary to Klebsiella UTI treated with IV antibiotics admitted on 05/18/2023 with altered mental status.   CTA chest and CT abdomen/pelvis with contrast were negative for PE but showed multifocal pneumonia involving the right upper and lower lobes. She was started on IV fluids and antibiotics.   Assessment: Patient was awake sitting up in bed.  She looks much more  alert than yesterday afternoon. She shares her daughter just left. She shared stories about her life including her work at the [employer], her time taking care of kids, and the importance of her daughters and faith. She enjoys sitting in the kitchen now that she can no longer do yard work. She shares that her daughters take great care of her and are committed to not putting her into a nursing home which is a great relief to her.   1020: Called patient's daughter [name]. We discussed how good the patient seemed this morning-- she agrees. She feels good about the plan of care established yesterday in our meeting. Answered any questions she had. PMT will be available as needed.  Recommendations/Plan: DNR Limited scope of treatment Continue to treat the treatable Plan to discharge home with daughters Outpatient palliative care PMT support as needed   Code Status:    Code Status Orders  (From admission, onward)           Start     Ordered   05/21/23 1717  Do not attempt resuscitation (DNR)- Limited -Do Not Intubate (DNI)  (Code Status)  Continuous       Question Answer Comment  If pulseless and not breathing No CPR or chest compressions.   In Pre-Arrest Conditions (Patient Is Breathing and Has A Pulse) Do not intubate. Provide all appropriate non-invasive medical interventions. Avoid ICU transfer unless indicated or required.   Consent: Discussion documented in EHR or advanced directives reviewed      05/21/23 1716          Care plan was discussed with bedside RN  Time spent: 35 minutes  Thank you for allowing the Palliative Medicine Team to assist in the care of this patient.   Sherryll Burger, NP  Please contact Palliative Medicine Team phone at [phone] for questions and concerns.

## 2023-05-22 NOTE — Evaluation (Signed)
Clinical/Bedside Swallow Evaluation Patient Details  Name: [name] [name] MRN: [id number] Date of Birth: [birth year]  Today's Date: 05/22/2023 Time: SLP Start Time (ACUTE ONLY): 1013 SLP Stop Time (ACUTE ONLY): 1040 SLP Time Calculation (min) (ACUTE ONLY): 27 min  Past Medical History:  Past Medical History:  Diagnosis Date   Arthritis    "knees, legs" (06/10/2016)   Ascites    Chronic kidney disease    "related to my diabetes"   Chronic lower back pain    Dementia without behavioral disturbance (HCC)    Diabetes mellitus without complication (HCC)    Eczema    GERD (gastroesophageal reflux disease)    High cholesterol    Hyperlipidemia    Hypertension    Hypothyroidism    OAB (overactive bladder)    Osteoporosis    Thyroid disease    Type II diabetes mellitus (HCC)    Urticaria    Past Surgical History:  Past Surgical History:  Procedure Laterality Date   ABDOMINAL AORTOGRAM W/LOWER EXTREMITY N/A 02/08/2023   Procedure: ABDOMINAL AORTOGRAM W/LOWER EXTREMITY;  Surgeon: Chuck Hint, MD;  Location: [facility] INVASIVE CV LAB;  Service: Vascular;  Laterality: N/A;   ABDOMINAL HYSTERECTOMY     KNEE ARTHROSCOPY     LAPAROTOMY N/A 12/03/2016   Procedure: EXPLORATORY LAPAROTOMY, LYSIS OF ADHESIONS;  Surgeon: Darnell Level, MD;  Location: [facility] ORS;  Service: General;  Laterality: N/A;   LOOP RECORDER INSERTION N/A 08/23/2019   Procedure: LOOP RECORDER INSERTION;  Surgeon: Marinus Maw, MD;  Location: [facility] INVASIVE CV LAB;  Service: Cardiovascular;  Laterality: N/A;   PERIPHERAL VASCULAR INTERVENTION  02/08/2023   Procedure: PERIPHERAL VASCULAR INTERVENTION;  Surgeon: Chuck Hint, MD;  Location: [facility] INVASIVE CV LAB;  Service: Vascular;;   vocal cord polyps     HPI:  Patient is an 89 y.o. female presented on 10/23 with AMS.  Portable chest x-ray revealed multifocal pulmonary infiltrates, right greater than left.  CTA chest and CT abdomen/pelvis with contrast were negative  for PE but showed multifocal pneumonia involving the right upper and lower lobes. PMH significant for  insulin-dependent T2DM, dermatomyositis, PAD, history of syncope due to orthostasis and vasovagal episodes, seizure, hypothyroidism, and macrocytic anemia. Recent hospitalization from 10/11-10/16 for acute encephalopathy secondary to Klebsiella UTI. Chronic hx of progressive dysphagia; three prior MBS studies, each showing deteriorating function since the last.    Assessment / Plan / Recommendation  Clinical Impression  Pt seen for skilled ST services for bedside swallow exam for increased mentation. Pt currently NPO following concerns for poor tolerance with regular/thin liquid diet with staff assisted feeding. Recent MBSS on 05/20/23 indicate aspiration and penetration with several consistencies (see MBSS report fpr further information). Pt remained on a regular/thin liquid diet following MBSS results to increase family decision making on preferred meals. Pt seen for BSE w/o family present. SLP administered oral care prior to PO intake. The pt consumed ice chips x2, sips of water via spoon x3 and straw x3, puree x3 with no overt s/sx of aspiration. Pt did present with functional deficits for each consistency (see below for more), grossly WFL. The pt had x2 bites of regular solid with increased difficulty with mastication, prolonged bolus holding, multiple swallows and decreased hyolaryngeal elevation and x1 throat clearance. Given the pt's recent MBSS and deteriorating swallow function, the pt still presents as an aspiration risk. SLP recommends regular/thin liquid diet to increase pt choice with meals per family discretion with STRICT aspiration precautions and FREQUENT oral  care to minimize risk of aspiration PNA. Results and recommendations communicated to pt, however due to decreased mentation SLP to follow up with family education. SLP Visit Diagnosis: Dysphagia, oropharyngeal phase (R13.12)     Aspiration Risk  Moderate aspiration risk    Diet Recommendation Regular;Thin liquid    Liquid Administration via: Straw;Spoon Medication Administration: Crushed with puree Supervision: Full supervision/cueing for compensatory strategies Compensations: Minimize environmental distractions;Small sips/bites;Slow rate Postural Changes: Seated upright at 90 degrees;Remain upright for at least 30 minutes after po intake    Other  Recommendations Oral Care Recommendations: Oral care QID    Recommendations for follow up therapy are one component of a multi-disciplinary discharge planning process, led by the attending physician.  Recommendations may be updated based on patient status, additional functional criteria and insurance authorization.  Follow up Recommendations Other (comment) (TBA)      Assistance Recommended at Discharge    Functional Status Assessment Patient has had a recent decline in their functional status and demonstrates the ability to make significant improvements in function in a reasonable and predictable amount of time.  Frequency and Duration min 2x/week  1 week       Prognosis Prognosis for improved oropharyngeal function: Guarded Barriers to Reach Goals: Time post onset;Severity of deficits      Swallow Study   General Date of Onset: 05/18/23 HPI: Patient is an 89 y.o. female presented on 10/23 with AMS.  Portable chest x-ray revealed multifocal pulmonary infiltrates, right greater than left.  CTA chest and CT abdomen/pelvis with contrast were negative for PE but showed multifocal pneumonia involving the right upper and lower lobes. PMH significant for  insulin-dependent T2DM, dermatomyositis, PAD, history of syncope due to orthostasis and vasovagal episodes, seizure, hypothyroidism, and macrocytic anemia. Recent hospitalization from 10/11-10/16 for acute encephalopathy secondary to Klebsiella UTI. Chronic hx of progressive dysphagia; three prior MBS studies, each  showing deteriorating function since the last. Type of Study: Bedside Swallow Evaluation Previous Swallow Assessment: 05/20/23, MBSS Diet Prior to this Study: NPO Respiratory Status: Room air History of Recent Intubation: No Behavior/Cognition: Alert;Cooperative;Pleasant mood Oral Cavity Assessment: Lesions;Dry (possible signs of oral thrush, nursing notified) Oral Care Completed by SLP: Yes Oral Cavity - Dentition: Edentulous (Dentures present but had difficulty keeping in her mouth. Pt reports eating food often w/o her dentures.) Vision: Functional for self-feeding Self-Feeding Abilities: Needs assist Patient Positioning: Upright in bed;Postural control adequate for testing Baseline Vocal Quality: Low vocal intensity Volitional Swallow: Able to elicit    Oral/Motor/Sensory Function     Ice Chips Ice chips: Within functional limits Presentation: Spoon   Thin Liquid Thin Liquid: Within functional limits Presentation: Spoon;Straw Pharyngeal  Phase Impairments: Multiple swallows;Decreased hyoid-laryngeal movement;Suspected delayed Swallow    Nectar Thick Nectar Thick Liquid: Not tested   Honey Thick Honey Thick Liquid: Not tested   Puree Puree: Within functional limits   Solid     Solid: Impaired Oral Phase Impairments: Impaired mastication Oral Phase Functional Implications: Prolonged oral transit Pharyngeal Phase Impairments: Suspected delayed Swallow;Decreased hyoid-laryngeal movement;Multiple swallows      Dione Housekeeper M.S. CCC-SLP

## 2023-05-23 DIAGNOSIS — J189 Pneumonia, unspecified organism: Secondary | ICD-10-CM | POA: Diagnosis not present

## 2023-05-23 LAB — CULTURE, BLOOD (ROUTINE X 2)
Culture: NO GROWTH
Culture: NO GROWTH
Special Requests: ADEQUATE
Special Requests: ADEQUATE

## 2023-05-23 LAB — GLUCOSE, CAPILLARY
Glucose-Capillary: 189 mg/dL — ABNORMAL HIGH (ref 70–99)
Glucose-Capillary: 158 mg/dL — ABNORMAL HIGH (ref 70–99)
Glucose-Capillary: 212 mg/dL — ABNORMAL HIGH (ref 70–99)
Glucose-Capillary: 219 mg/dL — ABNORMAL HIGH (ref 70–99)
Glucose-Capillary: 208 mg/dL — ABNORMAL HIGH (ref 70–99)

## 2023-05-23 MED ORDER — NYSTATIN 100000 UNIT/ML MT SUSP
5.0000 mL | Freq: Four times a day (QID) | OROMUCOSAL | Status: DC
  Administered 2023-05-23 – 2023-05-24 (×3): 500000 [IU] via ORAL
  Filled 2023-05-23 (×3): qty 5

## 2023-05-23 NOTE — Plan of Care (Signed)
  Problem: Education: Goal: Ability to describe self-care measures that may prevent or decrease complications (Diabetes Survival Skills Education) will improve Outcome: Progressing Goal: Individualized Educational Video(s) Outcome: Progressing   Problem: Coping: Goal: Ability to adjust to condition or change in health will improve Outcome: Progressing   Problem: Fluid Volume: Goal: Ability to maintain a balanced intake and output will improve Outcome: Progressing   Problem: Metabolic: Goal: Ability to maintain appropriate glucose levels will improve Outcome: Progressing   

## 2023-05-23 NOTE — Progress Notes (Signed)
PROGRESS NOTE    [name] [name]  ZOX:[id number] DOB: 08-24-1933 DOA: 05/18/2023 PCP: [name], MD   Brief Narrative:  89 y.o. female with medical history significant for dementia, insulin-dependent T2DM, dermatomyositis (on Plaquenil and chronic prednisone 5 mg daily), PAD, history of syncope due to orthostasis and vasovagal episodes, history of seizure, hypothyroidism, macrocytic anemia, recent hospitalization from 1011 24-10 1624 for acute encephalopathy secondary to Klebsiella UTI treated with IV antibiotics presented with altered mental status.  On presentation, WBC was 20, lactic acid of 1.9.  COVID-19/influenza/RSV PCR negative.  UA negative for UTI.  Portable chest x-ray showed multifocal pulmonary infiltrates, right greater than left.  CTA chest and CT abdomen/pelvis with contrast were negative for PE but showed multifocal pneumonia involving the right upper and lower lobes.  She was started on IV fluids and antibiotics.  Assessment & Plan:   Multifocal right-sided pneumonia -Possibly has a component of aspiration as well in the background of encephalopathy and dementia. -Continue Rocephin and doxycycline: Finish 7-day course of therapy.  Cultures negative so far.  Oxygen supplementation if needed.  Currently on room air.  Diet as per SLP recommendations: Apparently swallowing has recently worsened as per SLP evaluation.    Acute metabolic encephalopathy Dementia -Presented with worsening of mental status possibly due to pneumonia.  Patient has baseline dementia.  Monitor mental status.  Fall precautions.  Delirium precautions.  PT following. -Has waxing and waning mental status.  Was more awake yesterday and diet was resumed. Is awake this morning as well.  Dermatomyositis Chronic debility with bed/wheelchair bound -Continue prednisone.  Plaquenil and methotrexate on hold for now.  Outpatient follow-up with rheumatology  Chronic bilateral great toes wounds -Follow wound care  consult recommendations.  X-rays of left and right great toes did not show any acute abnormality  Diabetes mellitus type 2 with hypoglycemia and hyperglycemia -Continue long-acting insulin.  Patient has had episodes of hypoglycemia overnight and hyperglycemia during the daytime. Continue CBGs with SSI.  Diabetes coordinator following.  Hypertension with history of orthostatic hypotension -Blood pressure currently on the higher side.  Monitor.  DC'd midodrine.  History of seizure -Continue Keppra.   Peripheral artery disease -Status post angioplasty of bilateral common iliac artery stenosis in 01/2023.  Continue aspirin, Plavix, Zetia if able to swallow orally.  Leukocytosis -Resolved  Hypokalemia -Resolved  Hypomagnesemia -Resolved  Hyponatremia -Mild.  Labs pending today.  Anemia of chronic disease/normocytic anemia -From chronic illnesses.  Hemoglobin stable.  Monitor intermittently  Hypothyroidism -Continue levothyroxine  Goals of care -Palliative care following.  Family want to continue current course of care.  Will need outpatient follow-up with palliative care.  Severe malnutrition -Follow nutrition recommendations  Stage II mid sacrum pressure injury: Present on admission -follow wound care consult recommendations    DVT prophylaxis: Lovenox Code Status: DNR Family Communication: Daughter at bedside  Disposition Plan: Status is: Inpatient Remains inpatient appropriate because: Of severity of illness    Consultants: Palliative care  Procedures: None  Antimicrobials: Rocephin and doxycycline from 05/18/2023 onwards   Subjective: Patient seen and examined at bedside.  No seizures, fever, agitation or vomiting reported.   Objective: Vitals:   05/22/23 1635 05/22/23 1950 05/23/23 0004 05/23/23 0514  BP: (!) 161/58 (!) 157/56 (!) 165/56 (!) 151/55  Pulse: 61 67 84 71  Resp: 12 17 18 18   Temp: 97.6 F (36.4 C) 98 F (36.7 C) 97.9 F (36.6 C) 98.1  F (36.7 C)  TempSrc: Oral Oral Oral Oral  SpO2: 99% 99% 100% 98%  Weight:      Height:        Intake/Output Summary (Last 24 hours) at 05/23/2023 0731 Last data filed at 05/23/2023 0403 Gross per 24 hour  Intake 1982.43 ml  Output 500 ml  Net 1482.43 ml   Filed Weights   05/18/23 2028 05/18/23 2339  Weight: 54 kg 46.9 kg    Examination:  General: No acute distress.  Currently on room air.  Chronically ill and deconditioned looking.  Elderly female lying in bed. ENT/neck: No palpable thyromegaly or obvious JVD elevation noted  respiratory: Bilateral decreased breath sounds at bases with scattered crackles CVS: Currently rate controlled; S1 and S2 heard  abdominal: Soft, nontender, still slightly distended; no organomegaly, bowel sounds are heard normally extremities: No clubbing; mild lower extremity edema present  CNS: Alert; still very slow to respond, able to answer questions, poor historian. Lymph: No obvious lymphadenopathy noted  skin: No obvious petechia/rashes  psych: Flat affect.  Not agitated currently Musculoskeletal: No obvious joint tenderness/deformity  Data Reviewed: I have personally reviewed following labs and imaging studies  CBC: Recent Labs  Lab 05/18/23 1623 05/19/23 0651 05/20/23 1052 05/21/23 0558 05/22/23 0924  WBC 20.0* 13.2* 8.1 7.0 4.1  NEUTROABS 18.5*  --  5.9 5.2 2.6  HGB 10.1*  10.9* 8.6* 9.3* 9.6* 9.5*  HCT 31.1*  32.0* 26.2* 27.8* 28.9* 28.8*  MCV 98.7 97.8 98.9 97.3 98.3  PLT 310 266 290 297 309   Basic Metabolic Panel: Recent Labs  Lab 05/18/23 1623 05/19/23 0651 05/20/23 1052 05/21/23 0558 05/22/23 0924  NA 135  134* 135 138 138 134*  K 4.3  4.4 3.6 3.3* 3.3* 4.0  CL 95* 100 101 103 102  CO2 30 25 25 27 23   GLUCOSE 184* 66* 224* 112* 131*  BUN 17 12 11 9  7*  CREATININE 0.92 0.70 0.76 0.70 0.59  CALCIUM 10.3 9.7 9.6 9.4 9.5  MG  --   --  1.7 1.6* 2.0   GFR: Estimated Creatinine Clearance: 36.7 mL/min (by C-G  formula based on SCr of 0.59 mg/dL). Liver Function Tests: Recent Labs  Lab 05/18/23 1623 05/21/23 0558  AST 35 38  ALT 37 37  ALKPHOS 79 82  BILITOT 0.6 <0.1*  PROT 6.9 6.3*  ALBUMIN 3.2* 2.9*   No results for input(s): "LIPASE", "AMYLASE" in the last 168 hours. Recent Labs  Lab 05/18/23 1640  AMMONIA <10   Coagulation Profile: Recent Labs  Lab 05/18/23 1623  INR 1.1   Cardiac Enzymes: Recent Labs  Lab 05/18/23 1623  CKTOTAL 19*   BNP (last 3 results) No results for input(s): "PROBNP" in the last 8760 hours. HbA1C: No results for input(s): "HGBA1C" in the last 72 hours. CBG: Recent Labs  Lab 05/22/23 1146 05/22/23 1614 05/22/23 1944 05/22/23 2331 05/23/23 0424  GLUCAP 186* 118* 265* 398* 189*   Lipid Profile: No results for input(s): "CHOL", "HDL", "LDLCALC", "TRIG", "CHOLHDL", "LDLDIRECT" in the last 72 hours. Thyroid Function Tests: No results for input(s): "TSH", "T4TOTAL", "FREET4", "T3FREE", "THYROIDAB" in the last 72 hours.  Anemia Panel: No results for input(s): "VITAMINB12", "FOLATE", "FERRITIN", "TIBC", "IRON", "RETICCTPCT" in the last 72 hours. Sepsis Labs: Recent Labs  Lab 05/18/23 1623 05/19/23 0651  PROCALCITON  --  1.01  LATICACIDVEN 1.9  --     Recent Results (from the past 240 hour(s))  Resp panel by RT-PCR (RSV, Flu A&B, Covid) Anterior Nasal Swab     Status: None  Collection Time: 05/18/23  3:43 PM   Specimen: Anterior Nasal Swab  Result Value Ref Range Status   SARS Coronavirus 2 by RT PCR NEGATIVE NEGATIVE Final   Influenza A by PCR NEGATIVE NEGATIVE Final   Influenza B by PCR NEGATIVE NEGATIVE Final    Comment: (NOTE) The Xpert Xpress SARS-CoV-2/FLU/RSV plus assay is intended as an aid in the diagnosis of influenza from Nasopharyngeal swab specimens and should not be used as a sole basis for treatment. Nasal washings and aspirates are unacceptable for Xpert Xpress SARS-CoV-2/FLU/RSV testing.  Fact Sheet for  Patients: BloggerCourse.com  Fact Sheet for Healthcare Providers: SeriousBroker.it  This test is not yet approved or cleared by the [place] FDA and has been authorized for detection and/or diagnosis of SARS-CoV-2 by FDA under an Emergency Use Authorization (EUA). This EUA will remain in effect (meaning this test can be used) for the duration of the COVID-19 declaration under Section 564(b)(1) of the Act, 21 U.S.C. section 360bbb-3(b)(1), unless the authorization is terminated or revoked.     Resp Syncytial Virus by PCR NEGATIVE NEGATIVE Final    Comment: (NOTE) Fact Sheet for Patients: BloggerCourse.com  Fact Sheet for Healthcare Providers: SeriousBroker.it  This test is not yet approved or cleared by the [place] FDA and has been authorized for detection and/or diagnosis of SARS-CoV-2 by FDA under an Emergency Use Authorization (EUA). This EUA will remain in effect (meaning this test can be used) for the duration of the COVID-19 declaration under Section 564(b)(1) of the Act, 21 U.S.C. section 360bbb-3(b)(1), unless the authorization is terminated or revoked.  Performed at [facility], [address] [place], [place] [address]   Blood Culture (routine x 2)     Status: None (Preliminary result)   Collection Time: 05/18/23  3:47 PM   Specimen: BLOOD  Result Value Ref Range Status   Specimen Description BLOOD SITE NOT SPECIFIED  Final   Special Requests   Final    BOTTLES DRAWN AEROBIC AND ANAEROBIC Blood Culture adequate volume   Culture   Final    NO GROWTH 4 DAYS Performed at [facility], [address] [place], [place] [address]    Report Status PENDING  Incomplete  Blood Culture (routine x 2)     Status: None (Preliminary result)   Collection Time: 05/18/23  4:26 PM   Specimen: BLOOD  Result Value Ref Range Status   Specimen Description  BLOOD SITE NOT SPECIFIED  Final   Special Requests   Final    BOTTLES DRAWN AEROBIC AND ANAEROBIC Blood Culture adequate volume   Culture   Final    NO GROWTH 4 DAYS Performed at [facility], [address], [place], [place] [address]    Report Status PENDING  Incomplete         Radiology Studies: DG CHEST PORT 1 VIEW  Result Date: 05/21/2023 CLINICAL DATA:  Dyspnea EXAM: PORTABLE CHEST 1 VIEW COMPARISON:  Chest radiograph and CTA chest 3 days prior FINDINGS: The loop recorder is again seen. The cardiomediastinal silhouette is stable. There are persistent but slightly improved patchy opacities in the right upper lobe. There is no new or worsening focal airspace opacity. There is no pulmonary edema. There is no pleural effusion or pneumothorax There is no acute osseous abnormality. IMPRESSION: Persistent but slightly improved opacities in the right upper lobe. No new or worsening focal airspace opacity. Electronically Signed   By: Lesia Hausen M.D.   On: 05/21/2023  13:05        Scheduled Meds:  aspirin EC  81 mg Oral q AM   clopidogrel  75 mg Oral Q breakfast   doxycycline  100 mg Oral BID   enoxaparin (LOVENOX) injection  40 mg Subcutaneous Q24H   ezetimibe  10 mg Oral QHS   famotidine  20 mg Oral QHS   feeding supplement  237 mL Oral BID BM   ferrous sulfate  300 mg Oral Once per day on Monday Wednesday Friday   folic acid  1 mg Oral QHS   insulin aspart  0-6 Units Subcutaneous Q4H   insulin glargine-yfgn  5 Units Subcutaneous Daily   levETIRAcetam  500 mg Oral BID   levothyroxine  100 mcg Oral Q0600   multivitamin with minerals  1 tablet Oral Daily   predniSONE  5 mg Oral Q breakfast   Continuous Infusions:  sodium chloride 50 mL/hr at 05/23/23 0450   cefTRIAXone (ROCEPHIN)  IV Stopped (05/22/23 0957)   potassium chloride            Glade Lloyd, MD [facility] 05/23/2023, 7:31 AM

## 2023-05-23 NOTE — Plan of Care (Signed)
  Problem: Coping: Goal: Ability to adjust to condition or change in health will improve Outcome: Progressing   Problem: Health Behavior/Discharge Planning: Goal: Ability to manage health-related needs will improve Outcome: Progressing   Problem: Metabolic: Goal: Ability to maintain appropriate glucose levels will improve Outcome: Progressing   Problem: Nutritional: Goal: Maintenance of adequate nutrition will improve Outcome: Progressing

## 2023-05-24 ENCOUNTER — Ambulatory Visit: Payer: Medicare HMO

## 2023-05-24 DIAGNOSIS — J189 Pneumonia, unspecified organism: Secondary | ICD-10-CM | POA: Diagnosis not present

## 2023-05-24 LAB — GLUCOSE, CAPILLARY
Glucose-Capillary: 160 mg/dL — ABNORMAL HIGH (ref 70–99)
Glucose-Capillary: 191 mg/dL — ABNORMAL HIGH (ref 70–99)
Glucose-Capillary: 78 mg/dL (ref 70–99)

## 2023-05-24 MED ORDER — ASPIRIN 81 MG PO CHEW
81.0000 mg | CHEWABLE_TABLET | Freq: Every day | ORAL | Status: DC
  Administered 2023-05-24: 81 mg via ORAL
  Filled 2023-05-24: qty 1

## 2023-05-24 MED ORDER — EZETIMIBE 10 MG PO TABS: 10.0000 mg | ORAL_TABLET | Freq: Every day | ORAL | Status: AC

## 2023-05-24 MED ORDER — CETIRIZINE HCL 10 MG PO TABS: 10.0000 mg | ORAL_TABLET | Freq: Every day | ORAL | Status: DC | PRN

## 2023-05-24 MED ORDER — NYSTATIN 100000 UNIT/ML MT SUSP: 5.0000 mL | Freq: Four times a day (QID) | OROMUCOSAL | 2 refills | Status: AC

## 2023-05-24 MED ORDER — METOPROLOL TARTRATE 25 MG PO TABS: 12.5000 mg | ORAL_TABLET | ORAL | Status: DC | PRN

## 2023-05-24 NOTE — Progress Notes (Addendum)
Transition of Care [facility]) - Inpatient Brief Assessment   Patient Details  Name: [name] MRN: [id number] Date of Birth: 1934/01/10  [facility] CM/SW Contact:    Janae Bridgeman, RN Phone Number: 05/24/2023, 12:20 PM   Clinical Narrative: CM met with the patient and daughter at the bedside.  The patient will likely discharge back home with family today.  The patient's daughter states that the patient is currently active with [facility] services for RN and [facility] aide.  Active orders placed.  Patient has hospital bed and WC at the home.  Patient's daughter was offered Medicare choice regarding Outpatient Palliative Services and the patient was active with [facility] in the past and the daughter was agreeable to new referral.  I placed the referral with Glenna Fellows, RNCM with [facility] and he will follow up with the patient's family in outpatient setting.  Patient will likely need PTAR transport to home today.  Patient's bedside RN is aware.   Transition of Care Asessment: Insurance and Status: (P) Insurance coverage has been reviewed Patient has primary care physician: (P) Yes Home environment has been reviewed: (P) from home with daughters Prior level of function:: (P) HH assistance, family assistance at the home Prior/Current Home Services: (P) Current home services (Active with [facility] for RN, aide) Social Determinants of Health Reivew: (P) SDOH reviewed interventions complete Readmission risk has been reviewed: (P) Yes Transition of care needs: (P) transition of care needs identified, TOC will continue to follow

## 2023-05-24 NOTE — Care Management Important Message (Signed)
Important Message  Patient Details  Name: [name] [name] MRN: [id number] Date of Birth: [birth year]   Important Message Given:     PT'S HAS DISCHARGE . IM MAILED.   Mardene Sayer 05/24/2023, 3:05 PM

## 2023-05-24 NOTE — Plan of Care (Signed)
  Problem: Education: Goal: Individualized Educational Video(s) Outcome: Progressing   Problem: Coping: Goal: Ability to adjust to condition or change in health will improve Outcome: Progressing   Problem: Nutritional: Goal: Progress toward achieving an optimal weight will improve Outcome: Progressing   Problem: Clinical Measurements: Goal: Will remain free from infection Outcome: Progressing Goal: Diagnostic test results will improve Outcome: Progressing Goal: Respiratory complications will improve Outcome: Progressing Goal: Cardiovascular complication will be avoided Outcome: Progressing   Problem: Activity: Goal: Risk for activity intolerance will decrease Outcome: Progressing   Problem: Nutrition: Goal: Adequate nutrition will be maintained Outcome: Progressing   Problem: Coping: Goal: Level of anxiety will decrease Outcome: Progressing   Problem: Elimination: Goal: Will not experience complications related to bowel motility Outcome: Progressing Goal: Will not experience complications related to urinary retention Outcome: Progressing   Problem: Pain Management: Goal: General experience of comfort will improve Outcome: Progressing   Problem: Safety: Goal: Ability to remain free from injury will improve Outcome: Progressing   Problem: Skin Integrity: Goal: Risk for impaired skin integrity will decrease Outcome: Progressing

## 2023-05-24 NOTE — Progress Notes (Signed)
[id number] Liaison Note:  Notified by [facility] manager of patient/family request for [facility] Palliative services at home after discharge.  Please call with any hospice or outpatient palliative care related questions.  Thank you for the opportunity to participate in this patient's care.  Henderson Newcomer [facility] Liaison [phone]

## 2023-05-24 NOTE — Discharge Summary (Addendum)
Physician Discharge Summary  [name] [name]:[id number] DOB: [birth year] DOA: 05/18/2023  PCP: [name], MD  Admit date: 05/18/2023 Discharge date: 05/24/2023  Admitted From: Home Disposition: Home  Recommendations for Outpatient Follow-up:  Follow up with PCP in 1 week with repeat CBC/BMP Outpatient follow-up with palliative care Follow up in ED if symptoms worsen or new appear   Home Health: No Equipment/Devices: None  Discharge Condition: Stable CODE STATUS: Full Diet recommendation: Heart healthy/carb modified/diet as per SLP recommendations  Brief/Interim Summary: 89 y.o. female with medical history significant for dementia, insulin-dependent T2DM, dermatomyositis (on Plaquenil and chronic prednisone 5 mg daily), PAD, history of syncope due to orthostasis and vasovagal episodes, history of seizure, hypothyroidism, macrocytic anemia, recent hospitalization in October 2024 for acute encephalopathy secondary to Klebsiella UTI treated with IV antibiotics presented with altered mental status.  On presentation, WBC was 20, lactic acid of 1.9.  COVID-19/influenza/RSV PCR negative.  UA negative for UTI.  Portable chest x-ray showed multifocal pulmonary infiltrates, right greater than left.  CTA chest and CT abdomen/pelvis with contrast were negative for PE but showed multifocal pneumonia involving the right upper and lower lobes.  She was started on IV fluids and antibiotics.  During the hospitalization, her condition has improved.  Today is day #7 of antibiotics.  She is hemodynamically stable and family is okay for her to be discharged.  She will be discharged home today with outpatient follow-up with PCP/palliative care.  Discharge Diagnoses:   Multifocal right-sided pneumonia/possible aspiration pneumonia -Possibly has a component of aspiration as well in the background of encephalopathy and dementia. -Currently on Rocephin and doxycycline: Today is day #7.  No need for any more  antibiotics after today.  Cultures negative so far.  Currently on room air.  Diet as per SLP recommendations -Aspiration precautions. -Discharge patient home today.  Acute metabolic encephalopathy Dementia -Presented with worsening of mental status possibly due to pneumonia.  Patient has baseline dementia.  -Has waxing and waning mental status.  Has remained more awake recently.  Outpatient follow-up.    Dermatomyositis Chronic debility with bed/wheelchair bound -Continue prednisone.  Resume Plaquenil and methotrexate on discharge.  Outpatient follow-up with rheumatology   Chronic bilateral great toes wounds -Follow wound care consult recommendations.  X-rays of left and right great toes did not show any acute abnormality   Diabetes mellitus type 2 with hypoglycemia and hyperglycemia -Patient has had episodes of hypoglycemia overnight and hyperglycemia during the daytime. Continue carb modified diet.  Continue home regimen.  Outpatient follow-up with PCP.  Hypertension with history of orthostatic hypotension -Blood pressure currently on the higher side.  DC'd midodrine.  Follow-up with PCP regarding the same.  Use metoprolol if needed as previously being done at home.   History of seizure -Continue Keppra.    Peripheral artery disease -Status post angioplasty of bilateral common iliac artery stenosis in 01/2023.  Continue aspirin, Plavix, Zetia.   Leukocytosis -Resolved   Hypokalemia -Resolved   Hypomagnesemia -Resolved   Hyponatremia -Mild.  Labs pending today.   Anemia of chronic disease/normocytic anemia -From chronic illnesses.  Hemoglobin stable.  Monitor intermittently   Hypothyroidism -Continue levothyroxine   Goals of care -Palliative care following.  Family want to continue current course of care.  Will need outpatient follow-up with palliative care.   Severe malnutrition -Follow nutrition recommendations   Stage II mid sacrum pressure injury: Present on  admission -follow wound care consult recommendations  Possible oral thrush -Continue Diflucan and nystatin  Discharge  Instructions   Allergies as of 05/24/2023       Reactions   Nsaids Other (See Comments)   No NSAIDS due to kidney function- ESPECIALLY ibuprofen or Aleve   Biaxin [clarithromycin] Hives   Statins Other (See Comments)   Muscle pain (myositis per daugher)   Bactrim [sulfamethoxazole-trimethoprim] Hives, Rash   Lisinopril Cough   Sulfa Antibiotics Hives, Rash, Other (See Comments)   NO sulfa-based meds!!        Medication List     STOP taking these medications    midodrine 2.5 MG tablet Commonly known as: PROAMATINE       TAKE these medications    acetaminophen 650 MG CR tablet Commonly known as: TYLENOL Take 650 mg by mouth every 8 (eight) hours as needed for pain.   Bayer Low Dose 81 MG tablet Generic drug: aspirin EC Take 81 mg by mouth in the morning. Swallow whole.   cetirizine 10 MG tablet Commonly known as: ZYRTEC Take 1 tablet (10 mg total) by mouth daily as needed for allergies or rhinitis. What changed: when to take this   clopidogrel 75 MG tablet Commonly known as: PLAVIX Take 1 tablet (75 mg total) by mouth daily with breakfast.   CRANBERRY PO Take 1 tablet by mouth daily. Cystex Liquid: UTI Probiotic 1 tablespoon once a day   ezetimibe 10 MG tablet Commonly known as: Zetia Take 1 tablet (10 mg total) by mouth at bedtime.   famotidine 20 MG tablet Commonly known as: PEPCID Take 20 mg by mouth at bedtime.   ferrous sulfate 220 (44 Fe) MG/5ML solution Take 330 mg by mouth 3 (three) times a week.   fluconazole 100 MG tablet Commonly known as: DIFLUCAN Take 100 mg by mouth 3 (three) times a week.   folic acid 1 MG tablet Commonly known as: FOLVITE Take 1 mg by mouth at bedtime.   Levemir FlexTouch 100 UNIT/ML FlexTouch Pen Generic drug: insulin detemir Inject 3-7 Units into the skin See admin instructions. Inject 7  units into the skin in the morning and 3 units at bedtime PRN   levETIRAcetam 500 MG tablet Commonly known as: KEPPRA Take 500 mg by mouth in the morning and at bedtime.   methotrexate 2.5 MG tablet Commonly known as: RHEUMATREX Take 1 tablet (2.5 mg total) by mouth once a week. Caution:Chemotherapy. Protect from light. What changed:  how much to take when to take this   metoprolol tartrate 25 MG tablet Commonly known as: LOPRESSOR Take 0.5 tablets (12.5 mg total) by mouth as needed (if Systolic number is over 160 for over 2 hours).   multivitamin with minerals Tabs tablet Take 1 tablet by mouth daily with breakfast. OneaDay 50+ adult multivitamin   NovoLOG FlexPen 100 UNIT/ML FlexPen Generic drug: insulin aspart Inject 2-10 Units into the skin See admin instructions. Inject 2-10 units into the skin three times a day with meals, per sliding scale:  Breakfast: BGL 80-199 = 8 units; 200-299 = 9 units; 300 or greater = 10 units Lunch: BGL 80-199 = 5 units; 200-299 = 6 units; 300 or greater = 7 units Supper/evening meal: BGL 80-199 = 2 units; 200-299 = 3 units; 300 or greater = 4 units   nystatin 100000 UNIT/ML suspension Commonly known as: MYCOSTATIN Take 5 mLs (500,000 Units total) by mouth 4 (four) times daily.   Plaquenil 200 MG tablet Generic drug: hydroxychloroquine Take 200 mg by mouth at bedtime.   predniSONE 5 MG tablet Commonly known as: DELTASONE  Take 5 mg by mouth daily with breakfast.   Synthroid 100 MCG tablet Generic drug: levothyroxine Take 100 mcg by mouth daily before breakfast.   triamcinolone ointment 0.1 % Commonly known as: KENALOG Apply 1 Application topically 2 (two) times daily as needed (skin irritation- affected areas).   VITAMIN D-3 PO Take 2,000 Units by mouth daily.        Follow-up Information     [name], MD. Schedule an appointment as soon as possible for a visit in 1 week(s).   Specialty: Internal Medicine Contact  information: [address] [place] [address] [phone]                Allergies  Allergen Reactions   Nsaids Other (See Comments)    No NSAIDS due to kidney function- ESPECIALLY ibuprofen or Aleve   Biaxin [Clarithromycin] Hives   Statins Other (See Comments)    Muscle pain (myositis per daugher)   Bactrim [Sulfamethoxazole-Trimethoprim] Hives and Rash   Lisinopril Cough   Sulfa Antibiotics Hives, Rash and Other (See Comments)    NO sulfa-based meds!!     Consultations: Palliative care   Procedures/Studies: DG CHEST PORT 1 VIEW  Result Date: 05/21/2023 CLINICAL DATA:  Dyspnea EXAM: PORTABLE CHEST 1 VIEW COMPARISON:  Chest radiograph and CTA chest 3 days prior FINDINGS: The loop recorder is again seen. The cardiomediastinal silhouette is stable. There are persistent but slightly improved patchy opacities in the right upper lobe. There is no new or worsening focal airspace opacity. There is no pulmonary edema. There is no pleural effusion or pneumothorax There is no acute osseous abnormality. IMPRESSION: Persistent but slightly improved opacities in the right upper lobe. No new or worsening focal airspace opacity. Electronically Signed   By: Lesia Hausen M.D.   On: 05/21/2023 13:05   DG Swallowing Func-Speech Pathology  Result Date: 05/20/2023 Table formatting from the original result was not included. Modified Barium Swallow Study Patient Details Name: [name] [name] MRN: [id number] Date of Birth: 09-Sep-1933 Today's Date: 05/20/2023 HPI/PMH: HPI: Patient is an 89 y.o. female presented on 10/23 with AMS.  Portable chest x-ray revealed multifocal pulmonary infiltrates, right greater than left.  CTA chest and CT abdomen/pelvis with contrast were negative for PE but showed multifocal pneumonia involving the right upper and lower lobes. PMH significant for  insulin-dependent T2DM, dermatomyositis, PAD, history of syncope due to orthostasis and vasovagal  episodes, seizure, hypothyroidism, and macrocytic anemia. Recent hospitalization from 10/11-10/16 for acute encephalopathy secondary to Klebsiella UTI. Chronic hx of progressive dysphagia; three prior MBS studies, each showing deteriorating function since the last. Clinical Impression: Clinical Impression: Ms. Flippo presents with deteriorating overall swallow function since last MBS in January of 2024.  There was notable aspiration of thin and nectar-thick liquids (generally without a cough response), and honey-thick liquids as well as purees were observed to enter the laryngeal vestibule during the course of the study.  There was poor propulsion of all POs through the pharynx due to impaired base-of-tongue retraction, decreased pharyngeal squeeze, reduced mobility of the larynx, and poor epiglottic inversion.  These factors led to significant residue of all POs that remained despite attempts at an effortful swallow, use of chin tuck, and liquid washes.  There was aspiration of residue after the swallow and aspiration of liquids due to ineffective closure of the laryngeal vestibule.  Called pt's daughters after the study. They have been caring for their mother at home and have an excellent understanding of  their mother's swallowing issues, the concerns around progressing dysphagia and the adverse impact of aspiration.  We discussed having further conversations with Palliative Medicine (they have worked with Palliative Care and Home Hospice in the past; the experience with home hospice was quite negative).  They agree it would be helpful to gather more help in decision-making for the future. For now, continue a regular diet/thin liquids so Ms. Hunsucker' daughters can select foods their mom enjoys. They are here to feed her all her meals.  SLP will follow for further support/education as needed. Factors that may increase risk of adverse event in presence of aspiration Rubye Oaks [employer] Clearance Coots 2021): Factors that may increase  risk of adverse event in presence of aspiration Rubye Oaks & Clearance Coots 2021): Reduced cognitive function; Poor general health and/or compromised immunity; Frail or deconditioned; Dependence for feeding and/or oral hygiene; Weak cough; Aspiration of thick, dense, and/or acidic materials; Frequent aspiration of large volumes Recommendations/Plan: Swallowing Evaluation Recommendations Swallowing Evaluation Recommendations Recommendations: PO diet PO Diet Recommendation: Regular; Thin liquids (Level 0) Liquid Administration via: Cup; Straw Medication Administration: Crushed with puree Supervision: Full assist for feeding Swallowing strategies  : Slow rate; Small bites/sips Postural changes: Position pt fully upright for meals Oral care recommendations: Oral care BID (2x/day); Staff/trained caregiver to provide oral care Recommended consults: Consider Palliative care Treatment Plan Treatment Plan Treatment recommendations: Therapy as outlined in treatment plan below Follow-up recommendations: No SLP follow up Functional status assessment: Patient has had a recent decline in their functional status and demonstrates the ability to make significant improvements in function in a reasonable and predictable amount of time. Treatment frequency: Min 2x/week Treatment duration: 1 week Interventions: Diet toleration management by SLP; Patient/family education Recommendations Recommendations for follow up therapy are one component of a multi-disciplinary discharge planning process, led by the attending physician.  Recommendations may be updated based on patient status, additional functional criteria and insurance authorization. Assessment: Orofacial Exam: Orofacial Exam Oral Cavity - Dentition: Dentures, top Anatomy: Anatomy: Suspected cervical osteophytes Boluses Administered: Boluses Administered Boluses Administered: Thin liquids (Level 0); Mildly thick liquids (Level 2, nectar thick); Moderately thick liquids (Level 3, honey thick);  Puree  Oral Impairment Domain: Oral Impairment Domain Lip Closure: No labial escape Tongue control during bolus hold: Cohesive bolus between tongue to palatal seal Bolus preparation/mastication: Slow prolonged chewing/mashing with complete recollection Bolus transport/lingual motion: Delayed initiation of tongue motion (oral holding) Oral residue: Trace residue lining oral structures Location of oral residue : Tongue; Floor of mouth Initiation of pharyngeal swallow : Pyriform sinuses  Pharyngeal Impairment Domain: Pharyngeal Impairment Domain Soft palate elevation: No bolus between soft palate (SP)/pharyngeal wall (PW) Laryngeal elevation: Partial superior movement of thyroid cartilage/partial approximation of arytenoids to epiglottic petiole Anterior hyoid excursion: Partial anterior movement Epiglottic movement: Partial inversion Laryngeal vestibule closure: Incomplete, narrow column air/contrast in laryngeal vestibule Pharyngeal stripping wave : Present - diminished Pharyngeal contraction (A/P view only): N/A Pharyngoesophageal segment opening: Partial distention/partial duration, partial obstruction of flow Tongue base retraction: Wide column of contrast or air between tongue base and PPW Pharyngeal residue: Majority of contrast within or on pharyngeal structures Location of pharyngeal residue: Valleculae; Pharyngeal wall; Pyriform sinuses  Esophageal Impairment Domain: Esophageal Impairment Domain Esophageal clearance upright position: -- (NT) Pill: Pill Consistency administered: -- (NT) Penetration/Aspiration Scale Score: Penetration/Aspiration Scale Score 3.  Material enters airway, remains ABOVE vocal cords and not ejected out: Puree; Moderately thick liquids (Level 3, honey thick) 8.  Material enters airway, passes BELOW cords without attempt by  patient to eject out (silent aspiration) : Thin liquids (Level 0); Mildly thick liquids (Level 2, nectar thick) Compensatory Strategies: Compensatory Strategies  Compensatory strategies: Yes Straw: Ineffective Effortful swallow: Ineffective Chin tuck: Ineffective   General Information: Caregiver present: No  Diet Prior to this Study: Regular; Thin liquids (Level 0)   Temperature : Normal   No data recorded  Supplemental O2: None (Room air)   No data recorded Behavior/Cognition: Alert; Cooperative; Pleasant mood Self-Feeding Abilities: Needs set-up for self-feeding No data recorded Volitional Cough: Able to elicit Volitional Swallow: Able to elicit Exam Limitations: No limitations Goal Planning: Prognosis for improved oropharyngeal function: Guarded Barriers to Reach Goals: Severity of deficits No data recorded Patient/Family Stated Goal: none stated Consulted and agree with results and recommendations: Family member/caregiver Pain: Pain Assessment Pain Assessment: Faces Pain Score: 0 Faces Pain Scale: 0 End of Session: Start Time:SLP Start Time (ACUTE ONLY): 1012 Stop Time: SLP Stop Time (ACUTE ONLY): 1032 Time Calculation:SLP Time Calculation (min) (ACUTE ONLY): 20 min Charges: SLP Evaluations $ SLP Speech Visit: 1 Visit SLP Evaluations $BSS Swallow: 1 Procedure $MBS Swallow: 1 Procedure SLP visit diagnosis: SLP Visit Diagnosis: Dysphagia, oropharyngeal phase (R13.12) Past Medical History: Past Medical History: Diagnosis Date  Arthritis   "knees, legs" (06/10/2016)  Ascites   Chronic kidney disease   "related to my diabetes"  Chronic lower back pain   Dementia without behavioral disturbance (HCC)   Diabetes mellitus without complication (HCC)   Eczema   GERD (gastroesophageal reflux disease)   High cholesterol   Hyperlipidemia   Hypertension   Hypothyroidism   OAB (overactive bladder)   Osteoporosis   Thyroid disease   Type II diabetes mellitus (HCC)   Urticaria  Past Surgical History: Past Surgical History: Procedure Laterality Date  ABDOMINAL AORTOGRAM W/LOWER EXTREMITY N/A 02/08/2023  Procedure: ABDOMINAL AORTOGRAM W/LOWER EXTREMITY;  Surgeon: Chuck Hint, MD;   Location: [facility] INVASIVE CV LAB;  Service: Vascular;  Laterality: N/A;  ABDOMINAL HYSTERECTOMY    KNEE ARTHROSCOPY    LAPAROTOMY N/A 12/03/2016  Procedure: EXPLORATORY LAPAROTOMY, LYSIS OF ADHESIONS;  Surgeon: Darnell Level, MD;  Location: [facility] ORS;  Service: General;  Laterality: N/A;  LOOP RECORDER INSERTION N/A 08/23/2019  Procedure: LOOP RECORDER INSERTION;  Surgeon: Marinus Maw, MD;  Location: [facility] INVASIVE CV LAB;  Service: Cardiovascular;  Laterality: N/A;  PERIPHERAL VASCULAR INTERVENTION  02/08/2023  Procedure: PERIPHERAL VASCULAR INTERVENTION;  Surgeon: Chuck Hint, MD;  Location: [facility] INVASIVE CV LAB;  Service: Vascular;;  vocal cord polyps   Carolan Shiver 05/20/2023, 12:25 PM  DG Toe Great Right  Result Date: 05/19/2023 CLINICAL DATA:  Toe wounds EXAM: RIGHT GREAT TOE COMPARISON:  None Available. FINDINGS: No fracture or malalignment. Ulceration at the tip of the first digit. No underlying osseous erosive change. No soft tissue emphysema. Hallux valgus deformity at the first MTP joint with degenerative changes. Vascular calcifications. IMPRESSION: Ulceration at the tip of the first digit. No radiographic evidence for acute osseous abnormality Electronically Signed   By: Jasmine Pang M.D.   On: 05/19/2023 00:36   DG Toe Great Left  Result Date: 05/19/2023 CLINICAL DATA:  Wound EXAM: LEFT GREAT TOE COMPARISON:  None Available. FINDINGS: There is soft tissue swelling of the first toe. There are punctate calcific densities in the soft tissues along the plantar surface of the distal first phalanx. There is no acute fracture or cortical erosion. There is hallux valgus and mild degenerative change of the first metatarsophalangeal joint. Peripheral vascular calcifications are  noted. IMPRESSION: 1. Soft tissue swelling of the first toe. No acute fracture or cortical erosion. 2. Punctate calcific densities in the soft tissues along the plantar surface of the distal first phalanx. Please  correlate clinically. Electronically Signed   By: Darliss Cheney M.D.   On: 05/19/2023 00:35   CT Angio Chest PE W and/or Wo Contrast  Result Date: 05/18/2023 CLINICAL DATA:  Sepsis. Pulmonary embolism (PE) suspected, high prob; Sepsis EXAM: CT ANGIOGRAPHY CHEST CT ABDOMEN AND PELVIS WITH CONTRAST TECHNIQUE: Multidetector CT imaging of the chest was performed using the standard protocol during bolus administration of intravenous contrast. Multiplanar CT image reconstructions and MIPs were obtained to evaluate the vascular anatomy. Multidetector CT imaging of the abdomen and pelvis was performed using the standard protocol during bolus administration of intravenous contrast. RADIATION DOSE REDUCTION: This exam was performed according to the departmental dose-optimization program which includes automated exposure control, adjustment of the mA and/or kV according to patient size and/or use of iterative reconstruction technique. CONTRAST:  75mL OMNIPAQUE IOHEXOL 350 MG/ML SOLN COMPARISON:  None Available. FINDINGS: CTA CHEST FINDINGS Cardiovascular: Satisfactory opacification of the pulmonary arteries to the segmental level. No evidence of pulmonary embolism. Normal heart size. No pericardial effusion. Normal heart size. No significant pericardial effusion. The thoracic aorta is normal in caliber. Atherosclerotic plaque. Atherosclerotic plaque of the thoracic aorta. Four-vessel coronary artery calcifications. Mediastinum/Nodes: No enlarged mediastinal, hilar, or axillary lymph nodes. Thyroid gland, trachea, and esophagus demonstrate no significant findings. Lungs/Pleura: Patchy right upper lobe consolidative and ground-glass airspace opacities (3:62) right lower lobe atelectasis. To a lesser extent similar finding within the right lower lobe (3:76). No focal consolidation. No pulmonary nodule. No pulmonary mass. No pleural effusion. No pneumothorax. Musculoskeletal: No chest wall abnormality. No suspicious lytic or  blastic osseous lesions. No acute displaced fracture. Bilateral shoulder degenerative changes. T1-T2 endplate sclerosis. Review of the MIP images confirms the above findings. CT ABDOMEN and PELVIS FINDINGS Hepatobiliary: No focal liver abnormality. Status post cholecystectomy. No biliary dilatation. Pancreas: No focal lesion. Normal pancreatic contour. No surrounding inflammatory changes. No main pancreatic ductal dilatation. Spleen: Normal in size without focal abnormality. Adrenals/Urinary Tract: No adrenal nodule bilaterally. Bilateral kidneys enhance symmetrically. Fluid density lesion within left kidney likely represents simple renal cyst. Simple renal cysts, in the absence of clinically indicated signs/symptoms, require no independent follow-up. Subcentimeter hypodensities are too small to characterize-no further follow-up indicated. No hydronephrosis. No hydroureter. Circumferential symmetric urinary bladder wall thickening. On delayed imaging, there is no urothelial wall thickening and there are no filling defects in the opacified portions of the bilateral collecting systems or ureters. Stomach/Bowel: Stomach is within normal limits. No evidence of bowel wall thickening or dilatation. The appendix is not definitely identified with no inflammatory changes in the right lower quadrant to suggest acute appendicitis. Vascular/Lymphatic: Patent bilateral common iliac artery stent. No abdominal aorta or iliac aneurysm. Severe atherosclerotic plaque of the aorta and its branches. No abdominal, pelvic, or inguinal lymphadenopathy. Reproductive: Status post hysterectomy. No adnexal masses. Other: No intraperitoneal free fluid. No intraperitoneal free gas. No organized fluid collection. Musculoskeletal: No abdominal wall hernia or abnormality. No suspicious lytic or blastic osseous lesions. No acute displaced fracture. Multilevel degenerative changes of the spine. Review of the MIP images confirms the above findings.  IMPRESSION: 1. No pulmonary embolus. 2. Multifocal pneumonia involving the right upper and lower lobes. Recommend follow-up CT in 3 months to evaluate for complete resolution. 3. Circumferential urinary bladder wall thickening likely due to under distension.  Consider urinalysis for exclusion of superimposed infection. 4. Status post cholecystectomy and hysterectomy. 5. Aortic Atherosclerosis (ICD10-I70.0) including four-vessel coronary calcifications. Patent bilateral common iliac artery stent. Electronically Signed   By: Tish Frederickson M.D.   On: 05/18/2023 22:11   CT ABDOMEN PELVIS W CONTRAST  Result Date: 05/18/2023 CLINICAL DATA:  Sepsis. Pulmonary embolism (PE) suspected, high prob; Sepsis EXAM: CT ANGIOGRAPHY CHEST CT ABDOMEN AND PELVIS WITH CONTRAST TECHNIQUE: Multidetector CT imaging of the chest was performed using the standard protocol during bolus administration of intravenous contrast. Multiplanar CT image reconstructions and MIPs were obtained to evaluate the vascular anatomy. Multidetector CT imaging of the abdomen and pelvis was performed using the standard protocol during bolus administration of intravenous contrast. RADIATION DOSE REDUCTION: This exam was performed according to the departmental dose-optimization program which includes automated exposure control, adjustment of the mA and/or kV according to patient size and/or use of iterative reconstruction technique. CONTRAST:  75mL OMNIPAQUE IOHEXOL 350 MG/ML SOLN COMPARISON:  None Available. FINDINGS: CTA CHEST FINDINGS Cardiovascular: Satisfactory opacification of the pulmonary arteries to the segmental level. No evidence of pulmonary embolism. Normal heart size. No pericardial effusion. Normal heart size. No significant pericardial effusion. The thoracic aorta is normal in caliber. Atherosclerotic plaque. Atherosclerotic plaque of the thoracic aorta. Four-vessel coronary artery calcifications. Mediastinum/Nodes: No enlarged mediastinal,  hilar, or axillary lymph nodes. Thyroid gland, trachea, and esophagus demonstrate no significant findings. Lungs/Pleura: Patchy right upper lobe consolidative and ground-glass airspace opacities (3:62) right lower lobe atelectasis. To a lesser extent similar finding within the right lower lobe (3:76). No focal consolidation. No pulmonary nodule. No pulmonary mass. No pleural effusion. No pneumothorax. Musculoskeletal: No chest wall abnormality. No suspicious lytic or blastic osseous lesions. No acute displaced fracture. Bilateral shoulder degenerative changes. T1-T2 endplate sclerosis. Review of the MIP images confirms the above findings. CT ABDOMEN and PELVIS FINDINGS Hepatobiliary: No focal liver abnormality. Status post cholecystectomy. No biliary dilatation. Pancreas: No focal lesion. Normal pancreatic contour. No surrounding inflammatory changes. No main pancreatic ductal dilatation. Spleen: Normal in size without focal abnormality. Adrenals/Urinary Tract: No adrenal nodule bilaterally. Bilateral kidneys enhance symmetrically. Fluid density lesion within left kidney likely represents simple renal cyst. Simple renal cysts, in the absence of clinically indicated signs/symptoms, require no independent follow-up. Subcentimeter hypodensities are too small to characterize-no further follow-up indicated. No hydronephrosis. No hydroureter. Circumferential symmetric urinary bladder wall thickening. On delayed imaging, there is no urothelial wall thickening and there are no filling defects in the opacified portions of the bilateral collecting systems or ureters. Stomach/Bowel: Stomach is within normal limits. No evidence of bowel wall thickening or dilatation. The appendix is not definitely identified with no inflammatory changes in the right lower quadrant to suggest acute appendicitis. Vascular/Lymphatic: Patent bilateral common iliac artery stent. No abdominal aorta or iliac aneurysm. Severe atherosclerotic plaque of  the aorta and its branches. No abdominal, pelvic, or inguinal lymphadenopathy. Reproductive: Status post hysterectomy. No adnexal masses. Other: No intraperitoneal free fluid. No intraperitoneal free gas. No organized fluid collection. Musculoskeletal: No abdominal wall hernia or abnormality. No suspicious lytic or blastic osseous lesions. No acute displaced fracture. Multilevel degenerative changes of the spine. Review of the MIP images confirms the above findings. IMPRESSION: 1. No pulmonary embolus. 2. Multifocal pneumonia involving the right upper and lower lobes. Recommend follow-up CT in 3 months to evaluate for complete resolution. 3. Circumferential urinary bladder wall thickening likely due to under distension. Consider urinalysis for exclusion of superimposed infection. 4. Status post cholecystectomy and hysterectomy.  5. Aortic Atherosclerosis (ICD10-I70.0) including four-vessel coronary calcifications. Patent bilateral common iliac artery stent. Electronically Signed   By: Tish Frederickson M.D.   On: 05/18/2023 22:11   DG Chest Port 1 View  Result Date: 05/18/2023 CLINICAL DATA:  Sepsis EXAM: PORTABLE CHEST 1 VIEW COMPARISON:  05/01/2023 FINDINGS: Multifocal pulmonary infiltrates have developed within the right mid and lower lung zone, in keeping with multifocal pneumonia in the appropriate clinical setting. Sparse infiltrate also noted at the left lung base. The lungs are symmetrically well expanded. No pneumothorax or pleural effusion. Cardiac size within normal limits. Implanted loop recorder noted. No acute bone abnormality. IMPRESSION: 1. Multifocal pulmonary infiltrates, right greater than left, in keeping with multifocal pneumonia in the appropriate clinical setting. Electronically Signed   By: Helyn Numbers M.D.   On: 05/18/2023 20:08   CT Head Wo Contrast  Result Date: 05/18/2023 CLINICAL DATA:  Delirium EXAM: CT HEAD WITHOUT CONTRAST TECHNIQUE: Contiguous axial images were obtained  from the base of the skull through the vertex without intravenous contrast. RADIATION DOSE REDUCTION: This exam was performed according to the departmental dose-optimization program which includes automated exposure control, adjustment of the mA and/or kV according to patient size and/or use of iterative reconstruction technique. COMPARISON:  None Available. FINDINGS: Brain: There is no mass, hemorrhage or extra-axial collection. There is generalized atrophy without lobar predilection. Hypodensity of the white matter is most commonly associated with chronic microvascular disease. Vascular: Atherosclerotic calcification of the internal carotid arteries at the skull base. No abnormal hyperdensity of the major intracranial arteries or dural venous sinuses. Skull: The visualized skull base, calvarium and extracranial soft tissues are normal. Sinuses/Orbits: No fluid levels or advanced mucosal thickening of the visualized paranasal sinuses. No mastoid or middle ear effusion. Normal orbits. IMPRESSION: 1. No acute intracranial abnormality. 2. Generalized atrophy and findings of chronic microvascular disease. Electronically Signed   By: Deatra Robinson M.D.   On: 05/18/2023 18:59   DG CHEST PORT 1 VIEW  Result Date: 05/01/2023 CLINICAL DATA:  Acute encephalopathy, failure to thrive. EXAM: PORTABLE CHEST 1 VIEW COMPARISON:  03/16/2023. FINDINGS: The heart size and mediastinal contours are within normal limits. There is atherosclerotic calcification of the aorta. No consolidation, effusion, or pneumothorax. A loop recorder device is present over the left chest. No acute osseous abnormality. IMPRESSION: No active disease. Electronically Signed   By: Thornell Sartorius M.D.   On: 05/01/2023 02:41      Subjective: Patient seen and examined at bedside.  Sleepy, wakes up only very slightly.  Daughter present at bedside feels okay to take her mother home if she wakes up later today and eats her food okay.  No fever, agitation  or vomiting reported.  Discharge Exam: Vitals:   05/23/23 2300 05/24/23 0426  BP: (!) 190/67 (!) 165/48  Pulse: 68 (!) 54  Resp: 17 18  Temp: 98.1 F (36.7 C) 98.3 F (36.8 C)  SpO2: 100% 99%    General: Elderly female lying in bed.  On room air.  Looks chronically ill and deconditioned.  Sleepy, wakes up only very slightly.   Cardiovascular: Mild intermittent bradycardia present, S1/S2 + Respiratory: bilateral decreased breath sounds at bases with scattered crackles Abdominal: Soft, NT, ND, bowel sounds + Extremities: Trace lower extremity edema, no cyanosis    The results of significant diagnostics from this hospitalization (including imaging, microbiology, ancillary and laboratory) are listed below for reference.     Microbiology: Recent Results (from the past 240 hour(s))  Resp panel  by RT-PCR (RSV, Flu A&B, Covid) Anterior Nasal Swab     Status: None   Collection Time: 05/18/23  3:43 PM   Specimen: Anterior Nasal Swab  Result Value Ref Range Status   SARS Coronavirus 2 by RT PCR NEGATIVE NEGATIVE Final   Influenza A by PCR NEGATIVE NEGATIVE Final   Influenza B by PCR NEGATIVE NEGATIVE Final    Comment: (NOTE) The Xpert Xpress SARS-CoV-2/FLU/RSV plus assay is intended as an aid in the diagnosis of influenza from Nasopharyngeal swab specimens and should not be used as a sole basis for treatment. Nasal washings and aspirates are unacceptable for Xpert Xpress SARS-CoV-2/FLU/RSV testing.  Fact Sheet for Patients: BloggerCourse.com  Fact Sheet for Healthcare Providers: SeriousBroker.it  This test is not yet approved or cleared by the [place] FDA and has been authorized for detection and/or diagnosis of SARS-CoV-2 by FDA under an Emergency Use Authorization (EUA). This EUA will remain in effect (meaning this test can be used) for the duration of the COVID-19 declaration under Section 564(b)(1) of the Act, 21  U.S.C. section 360bbb-3(b)(1), unless the authorization is terminated or revoked.     Resp Syncytial Virus by PCR NEGATIVE NEGATIVE Final    Comment: (NOTE) Fact Sheet for Patients: BloggerCourse.com  Fact Sheet for Healthcare Providers: SeriousBroker.it  This test is not yet approved or cleared by the [place] FDA and has been authorized for detection and/or diagnosis of SARS-CoV-2 by FDA under an Emergency Use Authorization (EUA). This EUA will remain in effect (meaning this test can be used) for the duration of the COVID-19 declaration under Section 564(b)(1) of the Act, 21 U.S.C. section 360bbb-3(b)(1), unless the authorization is terminated or revoked.  Performed at [facility], [address] [place], [place] [address]   Blood Culture (routine x 2)     Status: None   Collection Time: 05/18/23  3:47 PM   Specimen: BLOOD  Result Value Ref Range Status   Specimen Description BLOOD SITE NOT SPECIFIED  Final   Special Requests   Final    BOTTLES DRAWN AEROBIC AND ANAEROBIC Blood Culture adequate volume   Culture   Final    NO GROWTH 5 DAYS Performed at [facility], [address] [place], [place] [address]    Report Status 05/23/2023 FINAL  Final  Blood Culture (routine x 2)     Status: None   Collection Time: 05/18/23  4:26 PM   Specimen: BLOOD  Result Value Ref Range Status   Specimen Description BLOOD SITE NOT SPECIFIED  Final   Special Requests   Final    BOTTLES DRAWN AEROBIC AND ANAEROBIC Blood Culture adequate volume   Culture   Final    NO GROWTH 5 DAYS Performed at [facility], [address] [place], [place] [address]    Report Status 05/23/2023 FINAL  Final     Labs: BNP (last 3 results) Recent Labs    09/03/22 1230 05/18/23 1625  BNP 123.0* 125.8*   Basic Metabolic Panel: Recent Labs  Lab 05/18/23 1623 05/19/23 0651 05/20/23 1052 05/21/23 0558 05/22/23 0924  NA  135  134* 135 138 138 134*  K 4.3  4.4 3.6 3.3* 3.3* 4.0  CL 95* 100 101 103 102  CO2 30 25 25 27 23   GLUCOSE 184* 66* 224* 112* 131*  BUN 17 12 11 9  7*  CREATININE 0.92 0.70 0.76 0.70 0.59  CALCIUM 10.3 9.7 9.6 9.4 9.5  MG  --   --  1.7 1.6* 2.0   Liver Function Tests: Recent Labs  Lab 05/18/23 1623 05/21/23 0558  AST 35 38  ALT 37 37  ALKPHOS 79 82  BILITOT 0.6 <0.1*  PROT 6.9 6.3*  ALBUMIN 3.2* 2.9*   No results for input(s): "LIPASE", "AMYLASE" in the last 168 hours. Recent Labs  Lab 05/18/23 1640  AMMONIA <10   CBC: Recent Labs  Lab 05/18/23 1623 05/19/23 0651 05/20/23 1052 05/21/23 0558 05/22/23 0924  WBC 20.0* 13.2* 8.1 7.0 4.1  NEUTROABS 18.5*  --  5.9 5.2 2.6  HGB 10.1*  10.9* 8.6* 9.3* 9.6* 9.5*  HCT 31.1*  32.0* 26.2* 27.8* 28.9* 28.8*  MCV 98.7 97.8 98.9 97.3 98.3  PLT 310 266 290 297 309   Cardiac Enzymes: Recent Labs  Lab 05/18/23 1623  CKTOTAL 19*   BNP: Invalid input(s): "POCBNP" CBG: Recent Labs  Lab 05/23/23 1633 05/23/23 2029 05/24/23 0030 05/24/23 0434 05/24/23 0744  GLUCAP 219* 208* 160* 78 191*   D-Dimer No results for input(s): "DDIMER" in the last 72 hours. Hgb A1c No results for input(s): "HGBA1C" in the last 72 hours. Lipid Profile No results for input(s): "CHOL", "HDL", "LDLCALC", "TRIG", "CHOLHDL", "LDLDIRECT" in the last 72 hours. Thyroid function studies No results for input(s): "TSH", "T4TOTAL", "T3FREE", "THYROIDAB" in the last 72 hours.  Invalid input(s): "FREET3" Anemia work up No results for input(s): "VITAMINB12", "FOLATE", "FERRITIN", "TIBC", "IRON", "RETICCTPCT" in the last 72 hours. Urinalysis    Component Value Date/Time   COLORURINE YELLOW 05/18/2023 1755   APPEARANCEUR CLEAR 05/18/2023 1755   LABSPEC 1.011 05/18/2023 1755   PHURINE 8.0 05/18/2023 1755   GLUCOSEU NEGATIVE 05/18/2023 1755   HGBUR NEGATIVE 05/18/2023 1755   BILIRUBINUR NEGATIVE 05/18/2023 1755   BILIRUBINUR neg 05/06/2015  1421   KETONESUR NEGATIVE 05/18/2023 1755   PROTEINUR NEGATIVE 05/18/2023 1755   UROBILINOGEN 1.0 05/06/2015 1421   UROBILINOGEN 0.2 12/25/2014 1206   NITRITE NEGATIVE 05/18/2023 1755   LEUKOCYTESUR NEGATIVE 05/18/2023 1755   Sepsis Labs Recent Labs  Lab 05/19/23 0651 05/20/23 1052 05/21/23 0558 05/22/23 0924  WBC 13.2* 8.1 7.0 4.1   Microbiology Recent Results (from the past 240 hour(s))  Resp panel by RT-PCR (RSV, Flu A&B, Covid) Anterior Nasal Swab     Status: None   Collection Time: 05/18/23  3:43 PM   Specimen: Anterior Nasal Swab  Result Value Ref Range Status   SARS Coronavirus 2 by RT PCR NEGATIVE NEGATIVE Final   Influenza A by PCR NEGATIVE NEGATIVE Final   Influenza B by PCR NEGATIVE NEGATIVE Final    Comment: (NOTE) The Xpert Xpress SARS-CoV-2/FLU/RSV plus assay is intended as an aid in the diagnosis of influenza from Nasopharyngeal swab specimens and should not be used as a sole basis for treatment. Nasal washings and aspirates are unacceptable for Xpert Xpress SARS-CoV-2/FLU/RSV testing.  Fact Sheet for Patients: BloggerCourse.com  Fact Sheet for Healthcare Providers: SeriousBroker.it  This test is not yet approved or cleared by the [place] FDA and has been authorized for detection and/or diagnosis of SARS-CoV-2 by FDA under an Emergency Use Authorization (EUA). This EUA will remain in effect (meaning this test can be used) for the duration of the COVID-19 declaration under Section 564(b)(1) of the Act, 21 U.S.C. section 360bbb-3(b)(1), unless the authorization is terminated or revoked.     Resp Syncytial Virus by PCR NEGATIVE NEGATIVE Final    Comment: (NOTE) Fact Sheet for Patients: BloggerCourse.com  Fact Sheet for Healthcare Providers: SeriousBroker.it  This test is  not yet approved or cleared by the [place] and has been authorized  for detection and/or diagnosis of SARS-CoV-2 by FDA under an Emergency Use Authorization (EUA). This EUA will remain in effect (meaning this test can be used) for the duration of the COVID-19 declaration under Section 564(b)(1) of the Act, 21 U.S.C. section 360bbb-3(b)(1), unless the authorization is terminated or revoked.  Performed at [facility], [address] [place], [place] [address]   Blood Culture (routine x 2)     Status: None   Collection Time: 05/18/23  3:47 PM   Specimen: BLOOD  Result Value Ref Range Status   Specimen Description BLOOD SITE NOT SPECIFIED  Final   Special Requests   Final    BOTTLES DRAWN AEROBIC AND ANAEROBIC Blood Culture adequate volume   Culture   Final    NO GROWTH 5 DAYS Performed at [facility], [address], [place], [place] [address]    Report Status 05/23/2023 FINAL  Final  Blood Culture (routine x 2)     Status: None   Collection Time: 05/18/23  4:26 PM   Specimen: BLOOD  Result Value Ref Range Status   Specimen Description BLOOD SITE NOT SPECIFIED  Final   Special Requests   Final    BOTTLES DRAWN AEROBIC AND ANAEROBIC Blood Culture adequate volume   Culture   Final    NO GROWTH 5 DAYS Performed at [facility], [address] [place], [place] [address]    Report Status 05/23/2023 FINAL  Final     Time coordinating discharge: 35 minutes  SIGNED:   Glade Lloyd, MD  [facility] 05/24/2023, 9:47 AM

## 2023-05-24 NOTE — Progress Notes (Signed)
Patient bladder scanned with result of 354. Family of patient made aware of "in and out catheter" procedure to remove urine. Per family, they will "just wait to see if she can go on her own." Provider made aware and confirmed understanding. Peripheral IV removed without complications. Patient's family given written and verbal discharge instructions-confirmed understanding.

## 2023-05-24 NOTE — Care Management Important Message (Signed)
Important Message  Patient Details  Name: [name] [name] MRN: [id number] Date of Birth: [birth year]   Important Message Given:     Patient left prior to IM delivery will mail a copy to the patient home address.    Fernando Stoiber 05/24/2023, 2:16 PM

## 2023-05-25 LAB — LEGIONELLA PNEUMOPHILA SEROGP 1 UR AG: L. pneumophila Serogp 1 Ur Ag: NEGATIVE

## 2023-06-03 ENCOUNTER — Ambulatory Visit (HOSPITAL_BASED_OUTPATIENT_CLINIC_OR_DEPARTMENT_OTHER): Payer: Medicare HMO | Admitting: Internal Medicine

## 2023-06-03 DIAGNOSIS — R059 Cough, unspecified: Secondary | ICD-10-CM | POA: Diagnosis not present

## 2023-06-08 ENCOUNTER — Ambulatory Visit (HOSPITAL_BASED_OUTPATIENT_CLINIC_OR_DEPARTMENT_OTHER): Payer: Medicare HMO | Admitting: Internal Medicine

## 2023-06-11 ENCOUNTER — Other Ambulatory Visit: Payer: Self-pay | Admitting: Vascular Surgery

## 2023-06-11 ENCOUNTER — Other Ambulatory Visit: Payer: Self-pay

## 2023-06-11 MED ORDER — CLOPIDOGREL BISULFATE 75 MG PO TABS: 75.0000 mg | ORAL_TABLET | Freq: Every day | ORAL | 0 refills | Status: DC

## 2023-06-14 ENCOUNTER — Encounter (HOSPITAL_COMMUNITY): Payer: Self-pay | Admitting: Internal Medicine

## 2023-06-14 ENCOUNTER — Inpatient Hospital Stay (HOSPITAL_COMMUNITY): Payer: Medicare HMO

## 2023-06-14 ENCOUNTER — Emergency Department (HOSPITAL_COMMUNITY): Payer: Medicare HMO

## 2023-06-14 ENCOUNTER — Inpatient Hospital Stay (HOSPITAL_COMMUNITY)
Admission: EM | Admit: 2023-06-14 | Discharge: 2023-06-21 | DRG: 064 | Disposition: A | Discharge status: Hospice | Payer: Medicare HMO | Attending: Internal Medicine | Admitting: Internal Medicine

## 2023-06-14 ENCOUNTER — Other Ambulatory Visit: Payer: Self-pay

## 2023-06-14 DIAGNOSIS — R4182 Altered mental status, unspecified: Secondary | ICD-10-CM | POA: Diagnosis not present

## 2023-06-14 DIAGNOSIS — I129 Hypertensive chronic kidney disease with stage 1 through stage 4 chronic kidney disease, or unspecified chronic kidney disease: Secondary | ICD-10-CM | POA: Diagnosis not present

## 2023-06-14 DIAGNOSIS — E039 Hypothyroidism, unspecified: Secondary | ICD-10-CM | POA: Diagnosis not present

## 2023-06-14 DIAGNOSIS — R739 Hyperglycemia, unspecified: Secondary | ICD-10-CM | POA: Diagnosis not present

## 2023-06-14 DIAGNOSIS — R4701 Aphasia: Secondary | ICD-10-CM | POA: Diagnosis present

## 2023-06-14 DIAGNOSIS — Z681 Body mass index (BMI) 19 or less, adult: Secondary | ICD-10-CM | POA: Diagnosis not present

## 2023-06-14 DIAGNOSIS — I959 Hypotension, unspecified: Secondary | ICD-10-CM | POA: Diagnosis not present

## 2023-06-14 DIAGNOSIS — D638 Anemia in other chronic diseases classified elsewhere: Secondary | ICD-10-CM | POA: Diagnosis present

## 2023-06-14 DIAGNOSIS — L97519 Non-pressure chronic ulcer of other part of right foot with unspecified severity: Secondary | ICD-10-CM | POA: Insufficient documentation

## 2023-06-14 DIAGNOSIS — D72829 Elevated white blood cell count, unspecified: Secondary | ICD-10-CM | POA: Diagnosis not present

## 2023-06-14 DIAGNOSIS — Z825 Family history of asthma and other chronic lower respiratory diseases: Secondary | ICD-10-CM

## 2023-06-14 DIAGNOSIS — G9341 Metabolic encephalopathy: Secondary | ICD-10-CM | POA: Diagnosis not present

## 2023-06-14 DIAGNOSIS — Z794 Long term (current) use of insulin: Secondary | ICD-10-CM

## 2023-06-14 DIAGNOSIS — Z66 Do not resuscitate: Secondary | ICD-10-CM | POA: Diagnosis present

## 2023-06-14 DIAGNOSIS — E7800 Pure hypercholesterolemia, unspecified: Secondary | ICD-10-CM | POA: Diagnosis present

## 2023-06-14 DIAGNOSIS — Z515 Encounter for palliative care: Secondary | ICD-10-CM

## 2023-06-14 DIAGNOSIS — Z833 Family history of diabetes mellitus: Secondary | ICD-10-CM

## 2023-06-14 DIAGNOSIS — Z8249 Family history of ischemic heart disease and other diseases of the circulatory system: Secondary | ICD-10-CM

## 2023-06-14 DIAGNOSIS — Z7952 Long term (current) use of systemic steroids: Secondary | ICD-10-CM

## 2023-06-14 DIAGNOSIS — R569 Unspecified convulsions: Secondary | ICD-10-CM | POA: Diagnosis not present

## 2023-06-14 DIAGNOSIS — Z79890 Hormone replacement therapy (postmenopausal): Secondary | ICD-10-CM

## 2023-06-14 DIAGNOSIS — E119 Type 2 diabetes mellitus without complications: Secondary | ICD-10-CM

## 2023-06-14 DIAGNOSIS — K920 Hematemesis: Secondary | ICD-10-CM | POA: Diagnosis not present

## 2023-06-14 DIAGNOSIS — Z800 Family history of malignant neoplasm of digestive organs: Secondary | ICD-10-CM

## 2023-06-14 DIAGNOSIS — I639 Cerebral infarction, unspecified: Secondary | ICD-10-CM | POA: Diagnosis not present

## 2023-06-14 DIAGNOSIS — E43 Unspecified severe protein-calorie malnutrition: Secondary | ICD-10-CM | POA: Diagnosis present

## 2023-06-14 DIAGNOSIS — Z882 Allergy status to sulfonamides status: Secondary | ICD-10-CM

## 2023-06-14 DIAGNOSIS — R29717 NIHSS score 17: Secondary | ICD-10-CM | POA: Diagnosis present

## 2023-06-14 DIAGNOSIS — Z79899 Other long term (current) drug therapy: Secondary | ICD-10-CM

## 2023-06-14 DIAGNOSIS — N390 Urinary tract infection, site not specified: Secondary | ICD-10-CM | POA: Diagnosis not present

## 2023-06-14 DIAGNOSIS — L8990 Pressure ulcer of unspecified site, unspecified stage: Secondary | ICD-10-CM | POA: Diagnosis present

## 2023-06-14 DIAGNOSIS — I10 Essential (primary) hypertension: Secondary | ICD-10-CM | POA: Diagnosis present

## 2023-06-14 DIAGNOSIS — E1151 Type 2 diabetes mellitus with diabetic peripheral angiopathy without gangrene: Secondary | ICD-10-CM | POA: Diagnosis present

## 2023-06-14 DIAGNOSIS — E871 Hypo-osmolality and hyponatremia: Secondary | ICD-10-CM | POA: Diagnosis not present

## 2023-06-14 DIAGNOSIS — I63449 Cerebral infarction due to embolism of unspecified cerebellar artery: Secondary | ICD-10-CM | POA: Diagnosis not present

## 2023-06-14 DIAGNOSIS — I6389 Other cerebral infarction: Secondary | ICD-10-CM | POA: Diagnosis not present

## 2023-06-14 DIAGNOSIS — D539 Nutritional anemia, unspecified: Secondary | ICD-10-CM | POA: Diagnosis not present

## 2023-06-14 DIAGNOSIS — R1310 Dysphagia, unspecified: Secondary | ICD-10-CM | POA: Diagnosis present

## 2023-06-14 DIAGNOSIS — M3313 Other dermatomyositis without myopathy: Secondary | ICD-10-CM | POA: Diagnosis not present

## 2023-06-14 DIAGNOSIS — R059 Cough, unspecified: Secondary | ICD-10-CM | POA: Diagnosis not present

## 2023-06-14 DIAGNOSIS — Z8659 Personal history of other mental and behavioral disorders: Secondary | ICD-10-CM | POA: Diagnosis not present

## 2023-06-14 DIAGNOSIS — Z743 Need for continuous supervision: Secondary | ICD-10-CM | POA: Diagnosis not present

## 2023-06-14 DIAGNOSIS — E876 Hypokalemia: Secondary | ICD-10-CM | POA: Diagnosis present

## 2023-06-14 DIAGNOSIS — Z87898 Personal history of other specified conditions: Secondary | ICD-10-CM

## 2023-06-14 DIAGNOSIS — N179 Acute kidney failure, unspecified: Secondary | ICD-10-CM | POA: Diagnosis present

## 2023-06-14 DIAGNOSIS — Z7982 Long term (current) use of aspirin: Secondary | ICD-10-CM

## 2023-06-14 DIAGNOSIS — I739 Peripheral vascular disease, unspecified: Secondary | ICD-10-CM | POA: Diagnosis present

## 2023-06-14 DIAGNOSIS — Z87891 Personal history of nicotine dependence: Secondary | ICD-10-CM

## 2023-06-14 DIAGNOSIS — Z7401 Bed confinement status: Secondary | ICD-10-CM | POA: Diagnosis not present

## 2023-06-14 DIAGNOSIS — Z7189 Other specified counseling: Secondary | ICD-10-CM | POA: Diagnosis not present

## 2023-06-14 DIAGNOSIS — L89152 Pressure ulcer of sacral region, stage 2: Secondary | ICD-10-CM | POA: Diagnosis not present

## 2023-06-14 DIAGNOSIS — L97511 Non-pressure chronic ulcer of other part of right foot limited to breakdown of skin: Secondary | ICD-10-CM | POA: Diagnosis not present

## 2023-06-14 DIAGNOSIS — R64 Cachexia: Secondary | ICD-10-CM | POA: Diagnosis present

## 2023-06-14 DIAGNOSIS — R627 Adult failure to thrive: Secondary | ICD-10-CM | POA: Diagnosis present

## 2023-06-14 DIAGNOSIS — I708 Atherosclerosis of other arteries: Secondary | ICD-10-CM | POA: Diagnosis present

## 2023-06-14 DIAGNOSIS — Z8673 Personal history of transient ischemic attack (TIA), and cerebral infarction without residual deficits: Secondary | ICD-10-CM

## 2023-06-14 DIAGNOSIS — F0390 Unspecified dementia without behavioral disturbance: Secondary | ICD-10-CM | POA: Diagnosis present

## 2023-06-14 DIAGNOSIS — E11621 Type 2 diabetes mellitus with foot ulcer: Secondary | ICD-10-CM | POA: Diagnosis not present

## 2023-06-14 DIAGNOSIS — Z881 Allergy status to other antibiotic agents status: Secondary | ICD-10-CM

## 2023-06-14 DIAGNOSIS — I951 Orthostatic hypotension: Secondary | ICD-10-CM | POA: Diagnosis present

## 2023-06-14 DIAGNOSIS — R9089 Other abnormal findings on diagnostic imaging of central nervous system: Secondary | ICD-10-CM | POA: Diagnosis not present

## 2023-06-14 DIAGNOSIS — R471 Dysarthria and anarthria: Secondary | ICD-10-CM | POA: Diagnosis present

## 2023-06-14 DIAGNOSIS — G319 Degenerative disease of nervous system, unspecified: Secondary | ICD-10-CM | POA: Diagnosis not present

## 2023-06-14 DIAGNOSIS — E1165 Type 2 diabetes mellitus with hyperglycemia: Secondary | ICD-10-CM | POA: Diagnosis not present

## 2023-06-14 DIAGNOSIS — I6621 Occlusion and stenosis of right posterior cerebral artery: Secondary | ICD-10-CM | POA: Diagnosis not present

## 2023-06-14 DIAGNOSIS — R4020 Unspecified coma: Secondary | ICD-10-CM | POA: Diagnosis not present

## 2023-06-14 DIAGNOSIS — Z7902 Long term (current) use of antithrombotics/antiplatelets: Secondary | ICD-10-CM

## 2023-06-14 DIAGNOSIS — Z888 Allergy status to other drugs, medicaments and biological substances status: Secondary | ICD-10-CM

## 2023-06-14 DIAGNOSIS — Z993 Dependence on wheelchair: Secondary | ICD-10-CM

## 2023-06-14 DIAGNOSIS — R278 Other lack of coordination: Secondary | ICD-10-CM | POA: Diagnosis present

## 2023-06-14 DIAGNOSIS — Z90710 Acquired absence of both cervix and uterus: Secondary | ICD-10-CM

## 2023-06-14 LAB — ETHANOL: Alcohol, Ethyl (B): <10 mg/dL (ref <10)

## 2023-06-14 LAB — DIFFERENTIAL
Abs Immature Granulocytes: 0.14 10*3/uL — ABNORMAL HIGH (ref 0.00–0.07)
Basophils Absolute: 0 10*3/uL (ref 0.0–0.1)
Basophils Relative: 0 %
Eosinophils Absolute: 0 10*3/uL (ref 0.0–0.5)
Eosinophils Relative: 0 %
Immature Granulocytes: 1 %
Lymphocytes Relative: 5 %
Lymphs Abs: 0.9 10*3/uL (ref 0.7–4.0)
Monocytes Absolute: 0.5 10*3/uL (ref 0.1–1.0)
Monocytes Relative: 3 %
Neutro Abs: 17 10*3/uL — ABNORMAL HIGH (ref 1.7–7.7)
Neutrophils Relative %: 91 %

## 2023-06-14 LAB — CBC
HCT: 27.9 % — ABNORMAL LOW (ref 36.0–46.0)
Hemoglobin: 9.2 g/dL — ABNORMAL LOW (ref 12.0–15.0)
MCH: 32.6 pg (ref 26.0–34.0)
MCHC: 33 g/dL (ref 30.0–36.0)
MCV: 98.9 fL (ref 80.0–100.0)
Platelets: 309 10*3/uL (ref 150–400)
RBC: 2.82 MIL/uL — ABNORMAL LOW (ref 3.87–5.11)
RDW: 14.3 % (ref 11.5–15.5)
WBC: 18.6 10*3/uL — ABNORMAL HIGH (ref 4.0–10.5)
nRBC: 0 % (ref 0.0–0.2)

## 2023-06-14 LAB — COMPREHENSIVE METABOLIC PANEL
ALT: 39 U/L (ref 0–44)
AST: 26 U/L (ref 15–41)
Albumin: 3.2 g/dL — ABNORMAL LOW (ref 3.5–5.0)
Alkaline Phosphatase: 125 U/L (ref 38–126)
Anion gap: 7 (ref 5–15)
BUN: 16 mg/dL (ref 8–23)
CO2: 29 mmol/L (ref 22–32)
Calcium: 9.8 mg/dL (ref 8.9–10.3)
Chloride: 92 mmol/L — ABNORMAL LOW (ref 98–111)
Creatinine, Ser: 0.83 mg/dL (ref 0.44–1.00)
GFR, Estimated: >60 mL/min (ref >60)
Glucose, Bld: 210 mg/dL — ABNORMAL HIGH (ref 70–99)
Potassium: 4 mmol/L (ref 3.5–5.1)
Sodium: 128 mmol/L — ABNORMAL LOW (ref 135–145)
Total Bilirubin: 0.4 mg/dL (ref <1.2)
Total Protein: 6.9 g/dL (ref 6.5–8.1)

## 2023-06-14 LAB — I-STAT CHEM 8, ED
BUN: 15 mg/dL (ref 8–23)
Calcium, Ion: 1.33 mmol/L (ref 1.15–1.40)
Chloride: 91 mmol/L — ABNORMAL LOW (ref 98–111)
Creatinine, Ser: 0.9 mg/dL (ref 0.44–1.00)
Glucose, Bld: 218 mg/dL — ABNORMAL HIGH (ref 70–99)
HCT: 28 % — ABNORMAL LOW (ref 36.0–46.0)
Hemoglobin: 9.5 g/dL — ABNORMAL LOW (ref 12.0–15.0)
Potassium: 4.1 mmol/L (ref 3.5–5.1)
Sodium: 131 mmol/L — ABNORMAL LOW (ref 135–145)
TCO2: 31 mmol/L (ref 22–32)

## 2023-06-14 LAB — URINALYSIS, ROUTINE W REFLEX MICROSCOPIC
Bacteria, UA: NONE SEEN
Bilirubin Urine: NEGATIVE
Glucose, UA: NEGATIVE mg/dL
Hgb urine dipstick: NEGATIVE
Ketones, ur: NEGATIVE mg/dL
Leukocytes,Ua: NEGATIVE
Nitrite: NEGATIVE
Protein, ur: 100 mg/dL — AB
Specific Gravity, Urine: 1.014 (ref 1.005–1.030)
pH: 6 (ref 5.0–8.0)

## 2023-06-14 LAB — CBG MONITORING, ED: Glucose-Capillary: 216 mg/dL — ABNORMAL HIGH (ref 70–99)

## 2023-06-14 LAB — PROTIME-INR
INR: 1.1 (ref 0.8–1.2)
Prothrombin Time: 14.5 s (ref 11.4–15.2)

## 2023-06-14 LAB — RAPID URINE DRUG SCREEN, HOSP PERFORMED
Amphetamines: NOT DETECTED
Barbiturates: NOT DETECTED
Benzodiazepines: NOT DETECTED
Cocaine: NOT DETECTED
Opiates: NOT DETECTED
Tetrahydrocannabinol: NOT DETECTED

## 2023-06-14 LAB — AMMONIA: Ammonia: 20 umol/L (ref 9–35)

## 2023-06-14 LAB — APTT: aPTT: 33 s (ref 24–36)

## 2023-06-14 LAB — SODIUM, URINE, RANDOM: Sodium, Ur: 68 mmol/L

## 2023-06-14 LAB — OSMOLALITY, URINE: Osmolality, Ur: 440 mosm/kg (ref 300–900)

## 2023-06-14 MED ORDER — LORAZEPAM 2 MG/ML IJ SOLN: 1.0000 mg | Freq: Four times a day (QID) | INTRAMUSCULAR | Status: DC | PRN

## 2023-06-14 MED ORDER — LEVOTHYROXINE SODIUM 100 MCG/5ML IV SOLN
75.0000 ug | Freq: Every day | INTRAVENOUS | Status: DC
  Administered 2023-06-15 – 2023-06-16 (×2): 75 ug via INTRAVENOUS
  Filled 2023-06-14 (×3): qty 5

## 2023-06-14 MED ORDER — SODIUM CHLORIDE 0.9% FLUSH: 3.0000 mL | INTRAVENOUS | Status: DC | PRN

## 2023-06-14 MED ORDER — HYDRALAZINE HCL 20 MG/ML IJ SOLN
5.0000 mg | Freq: Three times a day (TID) | INTRAMUSCULAR | Status: DC | PRN
Start: 2023-06-14 — End: 2023-06-14

## 2023-06-14 MED ORDER — LEVETIRACETAM IN NACL 500 MG/100ML IV SOLN
500.0000 mg | Freq: Two times a day (BID) | INTRAVENOUS | Status: DC
  Administered 2023-06-14 – 2023-06-16 (×4): 500 mg via INTRAVENOUS
  Filled 2023-06-14 (×4): qty 100

## 2023-06-14 MED ORDER — SODIUM CHLORIDE 0.9 % IV BOLUS
500.0000 mL | Freq: Once | INTRAVENOUS | Status: AC
  Administered 2023-06-14: 500 mL via INTRAVENOUS

## 2023-06-14 MED ORDER — SODIUM CHLORIDE 0.9% FLUSH
3.0000 mL | Freq: Two times a day (BID) | INTRAVENOUS | Status: DC
  Administered 2023-06-15 – 2023-06-20 (×11): 3 mL via INTRAVENOUS

## 2023-06-14 MED ORDER — SODIUM CHLORIDE 0.9% FLUSH
3.0000 mL | Freq: Two times a day (BID) | INTRAVENOUS | Status: DC
  Administered 2023-06-15 – 2023-06-21 (×12): 3 mL via INTRAVENOUS

## 2023-06-14 MED ORDER — HYDRALAZINE HCL 20 MG/ML IJ SOLN: 5.0000 mg | Freq: Three times a day (TID) | INTRAMUSCULAR | Status: DC | PRN

## 2023-06-14 MED ORDER — SODIUM CHLORIDE 0.9 % IV SOLN: INTRAVENOUS | Status: AC

## 2023-06-14 MED ORDER — GADOBUTROL 1 MMOL/ML IV SOLN
4.5000 mL | Freq: Once | INTRAVENOUS | Status: AC | PRN
  Administered 2023-06-14: 4.5 mL via INTRAVENOUS

## 2023-06-14 MED ORDER — SODIUM CHLORIDE 0.9 % IV SOLN: 250.0000 mL | INTRAVENOUS | Status: AC | PRN

## 2023-06-14 NOTE — H&P (Addendum)
History and Physical    [name] GNF:[id number] DOB: Jul 28, 1933 DOA: 06/14/2023  PCP: [name], MD   Patient coming from: Home   Chief Complaint:  Chief Complaint  Patient presents with   Fatigue   ED TRIAGE note:Per ems family reports pt has been minimally responsive since 11am, usually responsive to verbal stimuli, not opening eyes spontaneously, mouthing answers to questions, vss, on room air   HPI:  [name] [name] is a 89 y.o. female with medical history significant of medical history of dementia, insulin-dependent DM type II, dermatomyositis (on Plaquenil and chronic prednisone 5 mg daily, peripheral vascular disease, hypertension and history of orthostatic hypotension, hypothyroidism, chronic wound of the bilateral toe, history of recurrent syncope and vasovagal syncope, seizure, hypothyroidism, and macrocytic anemia presented to emergency department for evaluation for altered mental status.  Patient's daughter at the bedside reported that for last 4 to 5 days patient's mentation has been progressively worsening and functional status has been declining.  However for last 2 days she has been more sleepy.  She ate breakfast yesterday.  Daughter is able to give oral medications on daily basis.  She was able to give her levothyroxine, prednisone, Keppra this morning as well as due to elevated blood glucose around 200 at home patient got Levemir 7 units and 3 units of NovoLog. During my evaluation at the bedside patient is very lethargic, she does not open eyes with loud voice command and sternal rub and again dosed back to sleep. She cleansed her face when lab was drawn at the bedside.  History is very limited from the patient.  History gathered from chart review, ED physician note review, ED physician discussion and from the daughter at the bedside.  Per chart review patient has recently hospitalized 04/2910/4 for multifocal pneumonia/aspiration pneumonia.   ED Course:   At presentation to ED patient has bradycardia 52, blood pressure 146/68, respiratory rate 10 and O2 sat 98% room air. Normal blood glucose 216. CBC showing leukocytosis 18.6.  Stable H&H 9.8 and 27.7 platelet count 209. CMP showing low sodium 128, low chloride 192, elevated blood glucose 210, creatinine elevated 0.83, low albumin 3.2 otherwise unremarkable. Ammonia within normal range. TSH and T4 pending. Repeat I Chem-7 showed sodium has been improved to 132.  Creatinine trended up to 0.9. UDS unremarkable. UA hazy appearance and protein 100.  No leukocyte esterase, and bacteria.  Blood cultures are pending.  Chest x-ray showed resolution of the stable right lung airspace opacity seen on the most recent x-ray 11/1.  No definite acute lung disease.  CT head atrophy, and chronic microvascular disease. EKG showing bradycardia heart rate 53 and prolonged PR interval.  In the ED patient received 500 bolus of NS.  Hospitalist has been contacted for further evaluation management of altered mental status.  Current mental status plan to obtain MRI of the head, pending thyroid study panel.  Unclear if patient taking Keppra or not.  ED physician Dr. Lynelle Doctor reported that patient is mumbling and not coherent.  Admitting patient for further management of acute metabolic encephalopathy and hyponatremia  Significant labs in the ED: Lab Orders         Blood culture (routine x 2)         Ethanol         Protime-INR         APTT         CBC         Differential  Comprehensive metabolic panel         Urine rapid drug screen (hosp performed)         Urinalysis, Routine w reflex microscopic -Urine, Catheterized         Ammonia         TSH         T4, free         Sodium         Comprehensive metabolic panel         CBC         Osmolality         Osmolality, urine         Sodium, urine, random         I-stat chem 8, ED         CBG monitoring, ED       Review of Systems:  Review of  Systems  Unable to perform ROS: Mental status change    Past Medical History:  Diagnosis Date   Arthritis    "knees, legs" (06/10/2016)   Ascites    Chronic kidney disease    "related to my diabetes"   Chronic lower back pain    Dementia without behavioral disturbance (HCC)    Diabetes mellitus without complication (HCC)    Eczema    GERD (gastroesophageal reflux disease)    High cholesterol    Hyperlipidemia    Hypertension    Hypothyroidism    OAB (overactive bladder)    Osteoporosis    Thyroid disease    Type II diabetes mellitus (HCC)    Urticaria     Past Surgical History:  Procedure Laterality Date   ABDOMINAL AORTOGRAM W/LOWER EXTREMITY N/A 02/08/2023   Procedure: ABDOMINAL AORTOGRAM W/LOWER EXTREMITY;  Surgeon: Chuck Hint, MD;  Location[facility] INVASIVE CV LAB;  Service: Vascular;  Laterality: N/A;   ABDOMINAL HYSTERECTOMY     KNEE ARTHROSCOPY     LAPAROTOMY N/A 12/03/2016   Procedure: EXPLORATORY LAPAROTOMY, LYSIS OF ADHESIONS;  Surgeon: Darnell Level, MD;  Location: [facility] ORS;  Service: General;  Laterality: N/A;   LOOP RECORDER INSERTION N/A 08/23/2019   Procedure: LOOP RECORDER INSERTION;  Surgeon: Marinus Maw, MD;  Location: [facility] INVASIVE CV LAB;  Service: Cardiovascular;  Laterality: N/A;   PERIPHERAL VASCULAR INTERVENTION  02/08/2023   Procedure: PERIPHERAL VASCULAR INTERVENTION;  Surgeon: Chuck Hint, MD;  [facility] INVAS[facility] CV LAB;  Service: Vascular;;   vocal cord polyps       reports that she has never smoked. She quit smokeless tobacco use about 39 years ago.  Her smokeless tobacco use included chew. She reports that she does not drink alcohol and does not use drugs.  Allergies  Allergen Reactions   Nsaids Other (See Comments)    No NSAIDS due to kidney function- ESPECIALLY ibuprofen or Aleve   Biaxin [Clarithromycin] Hives   Statins Other (See Comments)    Muscle pain (myositis per daugher)   Bactrim  [Sulfamethoxazole-Trimethoprim] Hives and Rash   Lisinopril Cough   Sulfa Antibiotics Hives, Rash and Other (See Comments)    NO sulfa-based meds!!     Family History  Problem Relation Age of Onset   Diabetes Mother    Hypertension Mother    Asthma Father    Diabetes Sister    Stomach cancer Sister    Diabetes Brother    Allergic rhinitis Neg Hx    Eczema Neg Hx    Urticaria Neg Hx  Prior to Admission medications   Medication Sig Start Date End Date Taking? Authorizing Provider  acetaminophen (TYLENOL) 650 MG CR tablet Take 650 mg by mouth every 8 (eight) hours as needed for pain.    [provider]  BAYER LOW DOSE 81 MG EC tablet Take 81 mg by mouth in the morning. Swallow whole.    [provider]  cetirizine (ZYRTEC) 10 MG tablet Take 1 tablet (10 mg total) by mouth daily as needed for allergies or rhinitis. 05/24/23   Glade Lloyd, MD  Cholecalciferol (VITAMIN D-3 PO) Take 2,000 Units by mouth daily.    [provider]  clopidogrel (PLAVIX) 75 MG tablet Take 1 tablet (75 mg total) by mouth daily with breakfast. 06/11/23   Victorino Sparrow, MD  CRANBERRY PO Take 1 tablet by mouth daily. Cystex Liquid: UTI Probiotic 1 tablespoon once a day    [provider]  ezetimibe (ZETIA) 10 MG tablet Take 1 tablet (10 mg total) by mouth at bedtime. 05/24/23 05/23/24  Glade Lloyd, MD  famotidine (PEPCID) 20 MG tablet Take 20 mg by mouth at bedtime.    [provider]  ferrous sulfate 220 (44 Fe) MG/5ML solution Take 330 mg by mouth 3 (three) times a week. 12/27/22   [provider]  fluconazole (DIFLUCAN) 100 MG tablet Take 100 mg by mouth 3 (three) times a week. 04/11/23   [provider]  folic acid (FOLVITE) 1 MG tablet Take 1 mg by mouth at bedtime.    [provider]  insulin aspart (NOVOLOG FLEXPEN) 100 UNIT/ML FlexPen Inject 2-10 Units into the skin See admin instructions. Inject 2-10 units into the skin three  times a day with meals, per sliding scale:  Breakfast: BGL 80-199 = 8 units; 200-299 = 9 units; 300 or greater = 10 units Lunch: BGL 80-199 = 5 units; 200-299 = 6 units; 300 or greater = 7 units Supper/evening meal: BGL 80-199 = 2 units; 200-299 = 3 units; 300 or greater = 4 units    [provider]  insulin detemir (LEVEMIR FLEXTOUCH) 100 UNIT/ML FlexPen Inject 3-7 Units into the skin See admin instructions. Inject 7 units into the skin in the morning and 3 units at bedtime PRN    [provider]  levETIRAcetam (KEPPRA) 500 MG tablet Take 500 mg by mouth in the morning and at bedtime. 01/11/23   [provider]  methotrexate (RHEUMATREX) 2.5 MG tablet Take 1 tablet (2.5 mg total) by mouth once a week. Caution:Chemotherapy. Protect from light. Patient taking differently: Take 12.5 mg by mouth every Monday. Caution:Chemotherapy. Protect from light. 01/27/22   Marinda Elk, MD  metoprolol tartrate (LOPRESSOR) 25 MG tablet Take 0.5 tablets (12.5 mg total) by mouth as needed (if Systolic number is over 160 for over 2 hours). 05/24/23   Glade Lloyd, MD  Multiple Vitamin (MULTIVITAMIN WITH MINERALS) TABS tablet Take 1 tablet by mouth daily with breakfast. OneaDay 50+ adult multivitamin    [provider]  nystatin (MYCOSTATIN) 100000 UNIT/ML suspension Take 5 mLs (500,000 Units total) by mouth 4 (four) times daily. 05/24/23   Glade Lloyd, MD  PLAQUENIL 200 MG tablet Take 200 mg by mouth at bedtime. 10/07/22   [provider]  predniSONE (DELTASONE) 5 MG tablet Take 5 mg by mouth daily with breakfast.    [provider]  SYNTHROID 100 MCG tablet Take 100 mcg by mouth daily before breakfast.    [provider]  triamcinolone ointment (KENALOG)  0.1 % Apply 1 Application topically 2 (two) times daily as needed (skin irritation- affected areas). 07/16/22   [provider]     Physical Exam: Vitals:   06/14/23 1645 06/14/23 1647  06/14/23 1930  BP: 122/62  (!) 146/68  Pulse: 66  (!) 52  Resp: 15  10  Temp: 97.7 F (36.5 C)  (!) 97.4 F (36.3 C)  TempSrc: Oral  Axillary  SpO2: 98%  98%  Weight:  45.5 kg   Height:  5\' 7"  (1.702 m)     Physical Exam Constitutional:      Comments: Lethargic and drowsy/sleepy  HENT:     Head: Normocephalic.     Mouth/Throat:     Mouth: Mucous membranes are moist.  Eyes:     Pupils: Pupils are equal, round, and reactive to light.  Cardiovascular:     Rate and Rhythm: Normal rate and regular rhythm.     Pulses: Normal pulses.     Heart sounds: Normal heart sounds.  Pulmonary:     Effort: Pulmonary effort is normal.     Breath sounds: Normal breath sounds.  Abdominal:     General: Bowel sounds are normal. There is no distension.  Musculoskeletal:     Cervical back: Neck supple.     Right lower leg: No edema.     Left lower leg: No edema.     Comments: Right great toe chronic ulcer  Skin:    Capillary Refill: Capillary refill takes less than 2 seconds.     Findings: No bruising, erythema, lesion or rash.  Neurological:     Comments: Patient is not alert and oriented.  Very drowsy and lethargic.  Not opening eyes with voice command and sternal rub.  Psychiatric:     Comments: Unable to assess      Labs on Admission: I have personally reviewed following labs and imaging studies  CBC: Recent Labs  Lab 06/14/23 1730 06/14/23 1750  WBC 18.6*  --   NEUTROABS 17.0*  --   HGB 9.2* 9.5*  HCT 27.9* 28.0*  MCV 98.9  --   PLT 309  --    Basic Metabolic Panel: Recent Labs  Lab 06/14/23 1730 06/14/23 1750  NA 128* 131*  K 4.0 4.1  CL 92* 91*  CO2 29  --   GLUCOSE 210* 218*  BUN 16 15  CREATININE 0.83 0.90  CALCIUM 9.8  --    GFR: Estimated Creatinine Clearance: 31.6 mL/min (by C-G formula based on SCr of 0.9 mg/dL). Liver Function Tests: Recent Labs  Lab 06/14/23 1730  AST 26  ALT 39  ALKPHOS 125  BILITOT 0.4  PROT 6.9  ALBUMIN 3.2*   No results  for input(s): "LIPASE", "AMYLASE" in the last 168 hours. Recent Labs  Lab 06/14/23 1730  AMMONIA 20   Coagulation Profile: Recent Labs  Lab 06/14/23 1730  INR 1.1   Cardiac Enzymes: No results for input(s): "CKTOTAL", "CKMB", "CKMBINDEX", "TROPONINI", "TROPONINIHS" in the last 168 hours. BNP (last 3 results) Recent Labs    09/03/22 1230 05/18/23 1625  BNP 123.0* 125.8*   HbA1C: No results for input(s): "HGBA1C" in the last 72 hours. CBG: Recent Labs  Lab 06/14/23 1642  GLUCAP 216*   Lipid Profile: No results for input(s): "CHOL", "HDL", "LDLCALC", "TRIG", "CHOLHDL", "LDLDIRECT" in the last 72 hours. Thyroid Function Tests: No results for input(s): "TSH", "T4TOTAL", "FREET4", "T3FREE", "THYROIDAB" in the last 72 hours. Anemia Panel: No results for input(s): "VITAMINB12", "FOLATE", "FERRITIN", "  TIBC", "IRON", "RETICCTPCT" in the last 72 hours. Urine analysis:    Component Value Date/Time   COLORURINE YELLOW 06/14/2023 1844   APPEARANCEUR HAZY (A) 06/14/2023 1844   LABSPEC 1.014 06/14/2023 1844   PHURINE 6.0 06/14/2023 1844   GLUCOSEU NEGATIVE 06/14/2023 1844   HGBUR NEGATIVE 06/14/2023 1844   BILIRUBINUR NEGATIVE 06/14/2023 1844   BILIRUBINUR neg 05/06/2015 1421   KETONESUR NEGATIVE 06/14/2023 1844   PROTEINUR 100 (A) 06/14/2023 1844   UROBILINOGEN 1.0 05/06/2015 1421   UROBILINOGEN 0.2 12/25/2014 1206   NITRITE NEGATIVE 06/14/2023 1844   LEUKOCYTESUR NEGATIVE 06/14/2023 1844    Radiological Exams on Admission: I have personally reviewed images CT HEAD WO CONTRAST  Result Date: 06/14/2023 CLINICAL DATA:  Altered mental status EXAM: CT HEAD WITHOUT CONTRAST TECHNIQUE: Contiguous axial images were obtained from the base of the skull through the vertex without intravenous contrast. RADIATION DOSE REDUCTION: This exam was performed according to the departmental dose-optimization program which includes automated exposure control, adjustment of the mA and/or kV  according to patient size and/or use of iterative reconstruction technique. COMPARISON:  05/18/2023 FINDINGS: Brain: There is atrophy and chronic small vessel disease changes. No acute intracranial abnormality. Specifically, no hemorrhage, hydrocephalus, mass lesion, acute infarction, or significant intracranial injury. Vascular: No hyperdense vessel or unexpected calcification. Skull: No acute calvarial abnormality. Sinuses/Orbits: No acute findings Other: None IMPRESSION: Atrophy, chronic microvascular disease. No acute intracranial abnormality. Electronically Signed   By: Charlett Nose M.D.   On: 06/14/2023 17:58   DG Chest Portable 1 View  Result Date: 06/14/2023 CLINICAL DATA:  Loss of consciousness.  Altered mental status. EXAM: PORTABLE CHEST 1 VIEW COMPARISON:  AP chest 05/21/2023, 03/16/2023, 10/14/2022 FINDINGS: Cardiac silhouette and mediastinal contours are unchanged and within normal limits. Moderate calcification within the aortic arch. Left chest wall cardiac loop recorder is again noted. Improved aeration of the right lung. No definite acute lung opacity. The current lung appearance is similar to baseline 10/14/2022 radiographs. No pleural effusion pneumothorax. No acute skeletal abnormality. IMPRESSION: Resolution of the subtle right lung airspace opacities seen on most recent 05/21/2023 radiographs. No definite acute lung opacity. Electronically Signed   By: Neita Garnet M.D.   On: 06/14/2023 17:28    EKG: My personal interpretation of EKG shows: EKG showing sinus bradycardia heart rate 56.  Prolonged PR [place].  There is no ST and T wave abnormality.    Assessment/Plan: Principal Problem:   Acute metabolic encephalopathy Active Problems:   Leukocytosis   History of seizure   Hyponatremia   Insulin dependent type 2 diabetes mellitus (HCC)   Essential hypertension   Hypothyroidism   Dermatomyositis (HCC)   PVD (peripheral vascular disease) (HCC)   History of dementia    Chronic ulcer of right great toe (HCC)    Assessment and Plan: Acute metabolic encephalopathy Altered mentation-gradually improving > Patient presenting from home with declining mentation and functional status for last 7 days but for last 2 days it has been acutely declining.  Daughter at bedside reported that patient is more sleepy and drowsy and not eating.  Able to keep some oral medications this morning and IV insulin. - At presentation to ED patient is hemodynamically stable. -At the bedside physical exam patient is still lethargic and drowsy.   -Normal blood glucose 216. -CBC showing leukocytosis 18.6.  Stable H&H 9.8 and 27.7 platelet count 209. CMP showing low sodium 128, low chloride 192, elevated blood glucose 210, creatinine elevated 0.83, low albumin 3.2 otherwise unremarkable. -Ammonia  within normal range. -TSH and T4 pending. -Repeat I Chem-7 showed sodium has been improved to 132.  Creatinine trended up to 0.9. -UDS unremarkable.UA hazy appearance and protein 100.  No leukocyte esterase, and bacteria.  Blood cultures are pending. -Chest x-ray showed resolution of the stable right lung airspace opacity seen on the most recent x-ray 11/1.  No definite acute lung disease. -CT head atrophy, and chronic microvascular disease. -EKG showing bradycardia heart rate 53 and prolonged PR interval.  There is no ST anterior abnormality. -At this admission patient does not have any UTI and pneumonia.  Also ammonia within normal range. -Acute metabolic encephalopathy likely secondary from hypernatremia still ruling out other possibilities which could be stroke vs hypothyroidism vs unwitnessed seizure. - Obtaining MRI to rule out any acute intracranial abnormality versus stroke. - Obtaining routine EEG.  Did show significant abnormality need to reach out to neurology for further recommendation - Checking TSH and free T4 level. - Admitting patient to stepdown unit.  Keeping patient NPO.   Continue neurocheck every 4 hours.  Continue seizure precaution.  Continue a.m. precaution - Currently on maintenance fluid NS 40 cc/h for 1 day and checking serum sodium level every 4 hours. -Continue neurocheck every 4 hours, aspiration precaution, fall precaution and seizure precaution. -Bedside nurse will do swallow screen.  If patient does not pass need to consult speech for evaluation -Continue cardiac monitoring.   Leukocytosis-reactive -Patient is hemodynamically stable, afebrile.  Chest x-ray did not show any evidence of new infiltrate/pneumonia.  UA unremarkable.  No concern for infection at this time. -Evaluated right great toe-which showed chronic peripheral vascular disease related wound.  No sign of arrhythmia past formation or abscess.  No sign of infection - Reactive leukocytosis in the setting of chronic steroid use. - Monitor monitor fever curve and WBC count.  Hyponatremia -In the ED patient received 500 bolus of NS.  Sodium has been improved 128-132.  Currently continuing very low rate of maintenance NS 40 cc/h.  Checking sodium level every 4 hours.  Once sodium level will be normalized  will DC the IV fluid.  History of seizure - Daughter reported she was able to give the patient Keppra 500 mg this morning 11/25.  Family reported denies any noticing of active seizure episodes. - In the setting of altered mentation continue IV Keppra 500 mg twice daily - Pending EEG result/study.   History of dementia -Currently and never have been not on any medication for dementia.  On discharge patient need to establish care with neurology for evaluation and management for dementia. -Delirium precaution - Continue Ativan 1 g every 6 hours as needed for anxiety or or agitation.  Insulin-dependent DM type II -Currently NPO.  Holding insulin regimen. -POC blood glucose every 4 hours to address hypoglycemia or hyperglycemia  Essential hypertension History of orthostatic  hypotension - At home patient is on metoprolol 25 mg twice daily.  History of recurrent syncope in the past. -Holding metoprolol for now as patient has bradycardia heart rate 52-57. -At the bedside patient blood pressure is elevated 167/90 and heart rate 60.  Ordered hydralazine 5 mg every 8 hour as needed for SBP> 180 or DBP> 110.  Hypothyroidism -Pending TSH and T4 level. - Daughter reported give Synthroid 100 mcg this morning. -Given currently patient is n.p.o. and an altered mentation transition to IV Synthroid 75 mcg from tomorrow morning.  Dermatomyositis -Daughter reported gave Decadron 5 mg in the morning 11/25. -Patient also on Plaquenil 200 mg  and methotrexate 12.5 mg every Monday.  Currently Plaquenil and methotrexate on hold as patient is n.p.o. in the context of altered mentation  Right great toe ulcer-secondary to peripheral vascular disease -She patient has right great toe chronic ulcer due to peripheral vascular disease.  Consulting inpatient wound care for evaluation and dressing change  Peripheral vascular disease -At home patient is on aspirin, Plavix, and ezetimibe.  Due to n.p.o. currently on hold.   DVT prophylaxis:  SCDs.  Holding pharmacological DVT prophylaxis until MRI rules out evidence of a stroke or any other abnormality. Code Status:  DNR/DNI(Do NOT Intubate).  Reviewed ACP document and verified with family. Diet: Current NPO. Family Communication:   Family was present at bedside, at the time of interview. Opportunity was given to ask question and all questions were answered satisfactorily.  Disposition Plan: Pending improvement of mentation and clinical course. Consults: Speech therapy Admission status:   Inpatient, Step Down Unit  Severity of Illness: The appropriate patient status for this patient is INPATIENT. Inpatient status is judged to be reasonable and necessary in order to provide the required intensity of service to ensure the patient's safety.  The patient's presenting symptoms, physical exam findings, and initial radiographic and laboratory data in the context of their chronic comorbidities is felt to place them at high risk for further clinical deterioration. Furthermore, it is not anticipated that the patient will be medically stable for discharge from the hospital within 2 midnights of admission.   * I certify that at the point of admission it is my clinical judgment that the patient will require inpatient hospital care spanning beyond 2 midnights from the point of admission due to high intensity of service, high risk for further deterioration and high frequency of surveillance required.Marland Kitchen    Tereasa Coop, MD [facility]  How to contact the [employer] Attending or Consulting provider 7A - 7P or covering provider during after hours 7P -7A, for this patient.  Check the care team in [facility] and look for a) attending/consulting TR[facility] provider listed and b) the [facility] team listed Log into [email or web address] and use [facility] universal password to access. If you do not have the password, please contact the hospital operator. Locate the [facility] provider you are looking for under [facility] and page to a number that you can be directly reached. If you still have difficulty reaching the provider, please page the [facility] (Director on Call) for the Hospitalists listed on amion for assistance.  06/14/2023, 8:59 PM

## 2023-06-14 NOTE — ED Notes (Signed)
Patient transported to MRI 

## 2023-06-14 NOTE — ED Notes (Signed)
Pt is in MRI at this time.

## 2023-06-14 NOTE — ED Triage Notes (Addendum)
Per ems family reports pt has been minimally responsive since 11am, usually responsive to verbal stimuli, not opening eyes spontaneously, mouthing answers to questions, vss, on room air

## 2023-06-14 NOTE — ED Provider Notes (Signed)
[place] [facility] AT [facility] Provider Note   CSN: [id number] Arrival date & time: 06/14/23  1630     History {Add pertinent medical, surgical, social history, OB history to HPI:1}   [name] [name] is a 89 y.o. female.  HPI   Patient has a history of hypothyroidism hypertension acid reflux, sepsis, renal failure, delirium, dementia, bowel obstruction, facial droop, diabetes, pneumonia who presents to the ED for evaluation of altered mental status.  According to the medical records patient was recently admitted to the hospital on December 29.  [name] [name] was discharged on November 4.  Per the notes on the discharge summary patient has waxing and waning mental status.  On discharge apparently had been more awake.  Patient presents ED today for change in mental status according to family that started 57.  EMS reported patient generally was more talkative but since 13 [name] [name] has not been responding verbally or opening her eyes.  In the ED the patient mumbles but does not see any intelligible words  Home Medications Prior to Admission medications   Medication Sig Start Date End Date Taking? Authorizing Provider  acetaminophen (TYLENOL) 650 MG CR tablet Take 650 mg by mouth every 8 (eight) hours as needed for pain.    [provider]  BAYER LOW DOSE 81 MG EC tablet Take 81 mg by mouth in the morning. Swallow whole.    [provider]  cetirizine (ZYRTEC) 10 MG tablet Take 1 tablet (10 mg total) by mouth daily as needed for allergies or rhinitis. 05/24/23   Glade Lloyd, MD  Cholecalciferol (VITAMIN D-3 PO) Take 2,000 Units by mouth daily.    [provider]  clopidogrel (PLAVIX) 75 MG tablet Take 1 tablet (75 mg total) by mouth daily with breakfast. 06/11/23   Victorino Sparrow, MD  CRANBERRY PO Take 1 tablet by mouth daily. Cystex Liquid: UTI Probiotic 1 tablespoon once a day    [provider]  ezetimibe (ZETIA) 10 MG tablet Take 1  tablet (10 mg total) by mouth at bedtime. 05/24/23 05/23/24  Glade Lloyd, MD  famotidine (PEPCID) 20 MG tablet Take 20 mg by mouth at bedtime.    [provider]  ferrous sulfate 220 (44 Fe) MG/5ML solution Take 330 mg by mouth 3 (three) times a week. 12/27/22   [provider]  fluconazole (DIFLUCAN) 100 MG tablet Take 100 mg by mouth 3 (three) times a week. 04/11/23   [provider]  folic acid (FOLVITE) 1 MG tablet Take 1 mg by mouth at bedtime.    [provider]  insulin aspart (NOVOLOG FLEXPEN) 100 UNIT/ML FlexPen Inject 2-10 Units into the skin See admin instructions. Inject 2-10 units into the skin three times a day with meals, per sliding scale:  Breakfast: BGL 80-199 = 8 units; 200-299 = 9 units; 300 or greater = 10 units Lunch: BGL 80-199 = 5 units; 200-299 = 6 units; 300 or greater = 7 units Supper/evening meal: BGL 80-199 = 2 units; 200-299 = 3 units; 300 or greater = 4 units    [provider]  insulin detemir (LEVEMIR FLEXTOUCH) 100 UNIT/ML FlexPen Inject 3-7 Units into the skin See admin instructions. Inject 7 units into the skin in the morning and 3 units at bedtime PRN    [provider]  levETIRAcetam (KEPPRA) 500 MG tablet Take 500 mg by mouth in the morning and at bedtime. 01/11/23   [provider]  methotrexate (RHEUMATREX) 2.5 MG  tablet Take 1 tablet (2.5 mg total) by mouth once a week. Caution:Chemotherapy. Protect from light. Patient taking differently: Take 12.5 mg by mouth every Monday. Caution:Chemotherapy. Protect from light. 01/27/22   Marinda Elk, MD  metoprolol tartrate (LOPRESSOR) 25 MG tablet Take 0.5 tablets (12.5 mg total) by mouth as needed (if Systolic number is over 160 for over 2 hours). 05/24/23   Glade Lloyd, MD  Multiple Vitamin (MULTIVITAMIN WITH MINERALS) TABS tablet Take 1 tablet by mouth daily with breakfast. OneaDay 50+ adult multivitamin    [provider]  nystatin  (MYCOSTATIN) 100000 UNIT/ML suspension Take 5 mLs (500,000 Units total) by mouth 4 (four) times daily. 05/24/23   Glade Lloyd, MD  PLAQUENIL 200 MG tablet Take 200 mg by mouth at bedtime. 10/07/22   [provider]  predniSONE (DELTASONE) 5 MG tablet Take 5 mg by mouth daily with breakfast.    [provider]  SYNTHROID 100 MCG tablet Take 100 mcg by mouth daily before breakfast.    [provider]  triamcinolone ointment (KENALOG) 0.1 % Apply 1 Application topically 2 (two) times daily as needed (skin irritation- affected areas). 07/16/22   [provider]      Allergies    Nsaids, Biaxin [clarithromycin], Statins, Bactrim [sulfamethoxazole-trimethoprim], Lisinopril, and Sulfa antibiotics    Review of Systems   Review of Systems  Physical Exam Updated Vital Signs BP (!) 146/68   Pulse (!) 52   Temp (!) 97.4 F (36.3 C) (Axillary)   Resp 10   Ht 1.702 m (5\' 7" )   Wt 45.5 kg   SpO2 98%   BMI 15.69 kg/m  Physical Exam Vitals and nursing note reviewed.  Constitutional:      Appearance: [name] [name] is well-developed. [name] [name] is ill-appearing.  HENT:     Head: Normocephalic and atraumatic.     Right Ear: External ear normal.     Left Ear: External ear normal.     Mouth/Throat:     Mouth: Mucous membranes are dry.  Eyes:     General: No scleral icterus.       Right eye: No discharge.        Left eye: No discharge.     Conjunctiva/sclera: Conjunctivae normal.  Neck:     Trachea: No tracheal deviation.  Cardiovascular:     Rate and Rhythm: Normal rate and regular rhythm.  Pulmonary:     Effort: Pulmonary effort is normal. No respiratory distress.     Breath sounds: Normal breath sounds. No stridor. No wheezing or rales.  Abdominal:     General: Bowel sounds are normal. There is no distension.     Palpations: Abdomen is soft.     Tenderness: There is no abdominal tenderness. There is no guarding or rebound.  Musculoskeletal:        General: No  tenderness or deformity.     Cervical back: Neck supple.     Right lower leg: No edema.     Left lower leg: No edema.  Skin:    General: Skin is warm and dry.     Findings: No rash.  Neurological:     General: No focal deficit present.     Mental Status: [name] [name] is alert.     GCS: GCS eye subscore is 1. GCS verbal subscore is 2. GCS motor subscore is 5.     Cranial Nerves: Facial asymmetry present.     Motor: No seizure activity.  Psychiatric:  Mood and Affect: Mood normal.     ED Results / Procedures / Treatments   Labs (all labs ordered are listed, but only abnormal results are displayed) Labs Reviewed  CBC - Abnormal; Notable for the following components:      Result Value   WBC 18.6 (*)    RBC 2.82 (*)    Hemoglobin 9.2 (*)    HCT 27.9 (*)    All other components within normal limits  DIFFERENTIAL - Abnormal; Notable for the following components:   Neutro Abs 17.0 (*)    Abs Immature Granulocytes 0.14 (*)    All other components within normal limits  COMPREHENSIVE METABOLIC PANEL - Abnormal; Notable for the following components:   Sodium 128 (*)    Chloride 92 (*)    Glucose, Bld 210 (*)    Albumin 3.2 (*)    All other components within normal limits  URINALYSIS, ROUTINE W REFLEX MICROSCOPIC - Abnormal; Notable for the following components:   APPearance HAZY (*)    Protein, ur 100 (*)    All other components within normal limits  I-STAT CHEM 8, ED - Abnormal; Notable for the following components:   Sodium 131 (*)    Chloride 91 (*)    Glucose, Bld 218 (*)    Hemoglobin 9.5 (*)    HCT 28.0 (*)    All other components within normal limits  CBG MONITORING, ED - Abnormal; Notable for the following components:   Glucose-Capillary 216 (*)    All other components within normal limits  CULTURE, BLOOD (ROUTINE X 2)  CULTURE, BLOOD (ROUTINE X 2)  ETHANOL  PROTIME-INR  APTT  RAPID URINE DRUG SCREEN, HOSP PERFORMED  AMMONIA  TSH  T4, FREE    EKG EKG  Interpretation Date/Time:  Monday June 14 2023 18:01:06 EST Ventricular Rate:  53 PR Interval:  230 QRS Duration:  90 QT Interval:  467 QTC Calculation: 439 R Axis:   59  Text Interpretation: Sinus rhythm Prolonged PR interval Anteroseptal infarct, age indeterminate No significant change since last tracing Confirmed by Linwood Dibbles [id number]) on 06/14/2023 6:18:29 PM  Radiology CT HEAD WO CONTRAST  Result Date: 06/14/2023 CLINICAL DATA:  Altered mental status EXAM: CT HEAD WITHOUT CONTRAST TECHNIQUE: Contiguous axial images were obtained from the base of the skull through the vertex without intravenous contrast. RADIATION DOSE REDUCTION: This exam was performed according to the departmental dose-optimization program which includes automated exposure control, adjustment of the mA and/or kV according to patient size and/or use of iterative reconstruction technique. COMPARISON:  05/18/2023 FINDINGS: Brain: There is atrophy and chronic small vessel disease changes. No acute intracranial abnormality. Specifically, no hemorrhage, hydrocephalus, mass lesion, acute infarction, or significant intracranial injury. Vascular: No hyperdense vessel or unexpected calcification. Skull: No acute calvarial abnormality. Sinuses/Orbits: No acute findings Other: None IMPRESSION: Atrophy, chronic microvascular disease. No acute intracranial abnormality. Electronically Signed   By: Charlett Nose M.D.   On: 06/14/2023 17:58   DG Chest Portable 1 View  Result Date: 06/14/2023 CLINICAL DATA:  Loss of consciousness.  Altered mental status. EXAM: PORTABLE CHEST 1 VIEW COMPARISON:  AP chest 05/21/2023, 03/16/2023, 10/14/2022 FINDINGS: Cardiac silhouette and mediastinal contours are unchanged and within normal limits. Moderate calcification within the aortic arch. Left chest wall cardiac loop recorder is again noted. Improved aeration of the right lung. No definite acute lung opacity. The current lung appearance is similar to  baseline 10/14/2022 radiographs. No pleural effusion pneumothorax. No acute skeletal abnormality. IMPRESSION: Resolution of  the subtle right lung airspace opacities seen on most recent 05/21/2023 radiographs. No definite acute lung opacity. Electronically Signed   By: Neita Garnet M.D.   On: 06/14/2023 17:28    Procedures Procedures  {Document cardiac monitor, telemetry assessment procedure when appropriate:1}  Medications Ordered in ED Medications  sodium chloride 0.9 % bolus 500 mL (0 mLs Intravenous Stopped 06/14/23 1846)    ED Course/ Medical Decision Making/ A&P Clinical Course as of 06/14/23 2003  Mon Jun 14, 2023  1817 CBC(!) Hemoglobin similar compared to previous.  Leukocytosis noted new compared to last [JK]  1817 DG Chest Portable 1 View Chest x-ray without signs of pneumonia [JK]  1817 CT HEAD WO CONTRAST Head CT shows atrophy without acute changes [JK]  1829 Comprehensive metabolic panel(!) Hyponatremia worsening compared to last [JK]  1920 urinAlysis not suggestive of infection [JK]    Clinical Course User Index [JK] Linwood Dibbles, MD   {   Click here for ABCD2, HEART and other calculatorsREFRESH Note before signing :1}                              Medical Decision Making Problems Addressed: Altered mental status, unspecified altered mental status type: acute illness or injury that poses a threat to life or bodily functions  Amount and/or Complexity of Data Reviewed Labs: ordered. Decision-making details documented in ED Course. Radiology: ordered and independent interpretation performed. Decision-making details documented in ED Course.   Patient presents ED for evaluation of altered mental status.  Patient has history of dementia but reportedly more alert at baseline.  Patient's vital signs reassuring.  No hypotension.  No fever.  Patient's laboratory tests do show hyponatremia, worsening compared to previous.  Not clear that this is an to cause her symptoms.   Patient noted to have leukocytosis although daughter states [name] [name] is on chronic steroids.  No signs of UTI.  Chest x-ray without pneumonia.  Thyroid disorders have been ordered.  Will consult the medical service for admission and further workup.  Continue to monitor pulse ischemia  {Document critical care time when appropriate:1} {Document review of labs and clinical decision tools ie heart score, Chads2Vasc2 etc:1}  {Document your independent review of radiology images, and any outside records:1} {Document your discussion with family members, caretakers, and with consultants:1} {Document social determinants of health affecting pt's care:1} {Document your decision making why or why not admission, treatments were needed:1} Final Clinical Impression(s) / ED Diagnoses Final diagnoses:  Altered mental status, unspecified altered mental status type    Rx / DC Orders ED Discharge Orders     None

## 2023-06-15 ENCOUNTER — Inpatient Hospital Stay (HOSPITAL_COMMUNITY): Payer: Medicare HMO

## 2023-06-15 DIAGNOSIS — Z7189 Other specified counseling: Secondary | ICD-10-CM | POA: Diagnosis not present

## 2023-06-15 DIAGNOSIS — Z515 Encounter for palliative care: Secondary | ICD-10-CM | POA: Diagnosis not present

## 2023-06-15 DIAGNOSIS — R4182 Altered mental status, unspecified: Secondary | ICD-10-CM | POA: Diagnosis not present

## 2023-06-15 DIAGNOSIS — E871 Hypo-osmolality and hyponatremia: Secondary | ICD-10-CM | POA: Diagnosis not present

## 2023-06-15 DIAGNOSIS — Z87898 Personal history of other specified conditions: Secondary | ICD-10-CM | POA: Diagnosis not present

## 2023-06-15 DIAGNOSIS — I639 Cerebral infarction, unspecified: Secondary | ICD-10-CM | POA: Diagnosis not present

## 2023-06-15 DIAGNOSIS — I6340 Cerebral infarction due to embolism of unspecified cerebral artery: Secondary | ICD-10-CM

## 2023-06-15 DIAGNOSIS — I6389 Other cerebral infarction: Secondary | ICD-10-CM | POA: Diagnosis not present

## 2023-06-15 DIAGNOSIS — G9341 Metabolic encephalopathy: Secondary | ICD-10-CM | POA: Diagnosis not present

## 2023-06-15 DIAGNOSIS — I739 Peripheral vascular disease, unspecified: Secondary | ICD-10-CM | POA: Diagnosis not present

## 2023-06-15 DIAGNOSIS — R569 Unspecified convulsions: Secondary | ICD-10-CM

## 2023-06-15 LAB — CBC
HCT: 33 % — ABNORMAL LOW (ref 36.0–46.0)
Hemoglobin: 11 g/dL — ABNORMAL LOW (ref 12.0–15.0)
MCH: 32.8 pg (ref 26.0–34.0)
MCHC: 33.3 g/dL (ref 30.0–36.0)
MCV: 98.5 fL (ref 80.0–100.0)
Platelets: 241 10*3/uL (ref 150–400)
RBC: 3.35 MIL/uL — ABNORMAL LOW (ref 3.87–5.11)
RDW: 14.4 % (ref 11.5–15.5)
WBC: 10.4 10*3/uL (ref 4.0–10.5)
nRBC: 0 % (ref 0.0–0.2)

## 2023-06-15 LAB — COMPREHENSIVE METABOLIC PANEL
ALT: 34 U/L (ref 0–44)
AST: 24 U/L (ref 15–41)
Albumin: 3 g/dL — ABNORMAL LOW (ref 3.5–5.0)
Alkaline Phosphatase: 109 U/L (ref 38–126)
Anion gap: 7 (ref 5–15)
BUN: 14 mg/dL (ref 8–23)
CO2: 29 mmol/L (ref 22–32)
Calcium: 9.7 mg/dL (ref 8.9–10.3)
Chloride: 97 mmol/L — ABNORMAL LOW (ref 98–111)
Creatinine, Ser: 0.74 mg/dL (ref 0.44–1.00)
GFR, Estimated: >60 mL/min (ref >60)
Glucose, Bld: 141 mg/dL — ABNORMAL HIGH (ref 70–99)
Potassium: 3.6 mmol/L (ref 3.5–5.1)
Sodium: 133 mmol/L — ABNORMAL LOW (ref 135–145)
Total Bilirubin: 0.7 mg/dL (ref <1.2)
Total Protein: 6.6 g/dL (ref 6.5–8.1)

## 2023-06-15 LAB — LIPID PANEL
Cholesterol: 260 mg/dL — ABNORMAL HIGH (ref 0–200)
HDL: 83 mg/dL (ref >40)
LDL Cholesterol: 167 mg/dL — ABNORMAL HIGH (ref 0–99)
Total CHOL/HDL Ratio: 3.1 {ratio}
Triglycerides: 52 mg/dL (ref <150)
VLDL: 10 mg/dL (ref 0–40)

## 2023-06-15 LAB — GLUCOSE, CAPILLARY
Glucose-Capillary: 74 mg/dL (ref 70–99)
Glucose-Capillary: 107 mg/dL — ABNORMAL HIGH (ref 70–99)
Glucose-Capillary: 249 mg/dL — ABNORMAL HIGH (ref 70–99)
Glucose-Capillary: 231 mg/dL — ABNORMAL HIGH (ref 70–99)
Glucose-Capillary: 243 mg/dL — ABNORMAL HIGH (ref 70–99)
Glucose-Capillary: 233 mg/dL — ABNORMAL HIGH (ref 70–99)

## 2023-06-15 LAB — ECHOCARDIOGRAM COMPLETE
Area-P 1/2: 2.12 cm2
Calc EF: 65.8 %
Height: 67 in
MV M vel: 5.13 m/s
MV Peak grad: 105.3 mm[Hg]
MV VTI: 2.62 cm2
S' Lateral: 2.5 cm
Single Plane A2C EF: 66.4 %
Single Plane A4C EF: 63.7 %
Weight: 1626.11 [oz_av]

## 2023-06-15 LAB — CBG MONITORING, ED
Glucose-Capillary: 119 mg/dL — ABNORMAL HIGH (ref 70–99)
Glucose-Capillary: 120 mg/dL — ABNORMAL HIGH (ref 70–99)
Glucose-Capillary: 133 mg/dL — ABNORMAL HIGH (ref 70–99)
Glucose-Capillary: 85 mg/dL (ref 70–99)

## 2023-06-15 LAB — SODIUM
Sodium: 133 mmol/L — ABNORMAL LOW (ref 135–145)
Sodium: 135 mmol/L (ref 135–145)
Sodium: 133 mmol/L — ABNORMAL LOW (ref 135–145)
Sodium: 128 mmol/L — ABNORMAL LOW (ref 135–145)

## 2023-06-15 LAB — HEMOGLOBIN A1C
Hgb A1c MFr Bld: 8.5 % — ABNORMAL HIGH (ref 4.8–5.6)
Mean Plasma Glucose: 197.25 mg/dL

## 2023-06-15 LAB — VITAMIN B12: Vitamin B-12: 2107 pg/mL — ABNORMAL HIGH (ref 180–914)

## 2023-06-15 LAB — OSMOLALITY: Osmolality: 284 mosm/kg (ref 275–295)

## 2023-06-15 MED ORDER — ENOXAPARIN SODIUM 40 MG/0.4ML IJ SOSY: 40.0000 mg | PREFILLED_SYRINGE | Freq: Two times a day (BID) | INTRAMUSCULAR | Status: DC

## 2023-06-15 MED ORDER — INSULIN ASPART 100 UNIT/ML IJ SOLN
0.0000 [IU] | Freq: Every day | INTRAMUSCULAR | Status: DC
  Administered 2023-06-15: 2 [IU] via SUBCUTANEOUS
  Administered 2023-06-17: 3 [IU] via SUBCUTANEOUS

## 2023-06-15 MED ORDER — ASPIRIN 81 MG PO TBEC
81.0000 mg | DELAYED_RELEASE_TABLET | Freq: Every morning | ORAL | Status: DC
  Administered 2023-06-16 – 2023-06-21 (×5): 81 mg via ORAL
  Filled 2023-06-15 (×7): qty 1

## 2023-06-15 MED ORDER — DEXTROSE 50 % IV SOLN
12.5000 g | INTRAVENOUS | Status: AC
  Administered 2023-06-15: 12.5 g via INTRAVENOUS
  Filled 2023-06-15: qty 50

## 2023-06-15 MED ORDER — INSULIN ASPART 100 UNIT/ML IJ SOLN
0.0000 [IU] | Freq: Three times a day (TID) | INTRAMUSCULAR | Status: DC
  Administered 2023-06-15: 3 [IU] via SUBCUTANEOUS
  Administered 2023-06-16: 2 [IU] via SUBCUTANEOUS
  Administered 2023-06-17 (×2): 5 [IU] via SUBCUTANEOUS
  Administered 2023-06-17 – 2023-06-18 (×2): 3 [IU] via SUBCUTANEOUS
  Administered 2023-06-18: 1 [IU] via SUBCUTANEOUS
  Administered 2023-06-18: 5 [IU] via SUBCUTANEOUS
  Administered 2023-06-19: 1 [IU] via SUBCUTANEOUS
  Administered 2023-06-19 – 2023-06-20 (×2): 2 [IU] via SUBCUTANEOUS
  Administered 2023-06-20: 9 [IU] via SUBCUTANEOUS
  Administered 2023-06-21: 7 [IU] via SUBCUTANEOUS
  Administered 2023-06-21: 2 [IU] via SUBCUTANEOUS

## 2023-06-15 MED ORDER — ASPIRIN 300 MG RE SUPP: 300.0000 mg | Freq: Every day | RECTAL | Status: DC

## 2023-06-15 MED ORDER — ENOXAPARIN SODIUM 40 MG/0.4ML IJ SOSY
40.0000 mg | PREFILLED_SYRINGE | INTRAMUSCULAR | Status: DC
  Administered 2023-06-15 – 2023-06-21 (×7): 40 mg via SUBCUTANEOUS
  Filled 2023-06-15 (×7): qty 0.4

## 2023-06-15 MED ORDER — SODIUM CHLORIDE (PF) 0.9 % IJ SOLN
INTRAMUSCULAR | Status: AC
  Filled 2023-06-15: qty 50

## 2023-06-15 MED ORDER — CLOPIDOGREL BISULFATE 75 MG PO TABS
75.0000 mg | ORAL_TABLET | Freq: Every day | ORAL | Status: DC
  Administered 2023-06-16 – 2023-06-21 (×6): 75 mg via ORAL
  Filled 2023-06-15 (×6): qty 1

## 2023-06-15 MED ORDER — IOHEXOL 350 MG/ML SOLN
75.0000 mL | Freq: Once | INTRAVENOUS | Status: AC | PRN
  Administered 2023-06-15: 75 mL via INTRAVENOUS

## 2023-06-15 MED ORDER — HYDROCORTISONE SOD SUC (PF) 100 MG IJ SOLR
100.0000 mg | Freq: Every day | INTRAMUSCULAR | Status: DC
  Administered 2023-06-15: 100 mg via INTRAVENOUS
  Filled 2023-06-15 (×2): qty 2

## 2023-06-15 MED ORDER — EZETIMIBE 10 MG PO TABS
10.0000 mg | ORAL_TABLET | Freq: Every day | ORAL | Status: DC
  Administered 2023-06-15 – 2023-06-20 (×6): 10 mg via ORAL
  Filled 2023-06-15 (×6): qty 1

## 2023-06-15 MED ORDER — ASPIRIN 300 MG RE SUPP
300.0000 mg | Freq: Once | RECTAL | Status: AC
  Administered 2023-06-15: 300 mg via RECTAL
  Filled 2023-06-15: qty 1

## 2023-06-15 MED ORDER — INSULIN ASPART 100 UNIT/ML IJ SOLN: 0.0000 [IU] | Freq: Three times a day (TID) | INTRAMUSCULAR | Status: DC

## 2023-06-15 NOTE — Consult Note (Signed)
NEUROLOGY CONSULT NOTE   Date of service: June 15, 2023 Patient Name: [name] [name] MRN:  [id number] DOB:  24-Apr-1934 Chief Complaint: "progressive lethargy" Requesting Provider: Tereasa Coop, MD  History of Present Illness  [name] [name] is a 89 y.o. with a past medical history significant for hypertension, hyperlipidemia (not on statin secondary to dermatomyositis, for which she is on chronic prednisone and Plaquenil), insulin-dependent diabetes type 2, peripheral vascular disease, possible seizures, hypothyroidism, recurrent syncopal events, cryptogenic stroke in 2021  Neurology note from 09/10/2022 describes an episode of unresponsiveness with right gaze deviation although daughter denies right gaze deviation to me and reports that the patient's syncopal events have typically occurred in the setting of trying to have a bowel movement and there is no particular eye gaze deviation.  Keppra was initially started after right gaze deviation episode in March 2021; she did receive TNK for that episode, MRI was negative for acute intracranial process, long-term EEG monitoring was negative but started  She did have a loop recorder placed on 08/23/2019 through February 2024 for workup of her syncopal events but the study was negative.  Daughter is concerned that the device did not have appropriate sensitivity and is not interested in further interrogating the device   LKW: 4 to 5 days prior to presentation Modified rankin score: 5-Severe disability-bedridden, incontinent, needs constant attention IV Thrombolysis: No, out of the window EVT: No, out of the window   NIHSS components Score: Comment  1a Level of Conscious 0[]  1[]  2[x]  3[]      1b LOC Questions 0[]  1[]  2[x]       1c LOC Commands 0[]  1[x]  2[]       2 Best Gaze 0[x]  1[]  2[]       3 Visual 0[x]  1[]  2[]  3[]      4 Facial Palsy 0[x]  1[]  2[]  3[]      5a Motor Arm - left 0[]  1[x]  2[]  3[]  4[]  UN[]    5b Motor Arm - Right 0[]  1[x]  2[]  3[]   4[]  UN[]    6a Motor Leg - Left 0[]  1[]  2[]  3[x]  4[]  UN[]    6b Motor Leg - Right 0[]  1[]  2[]  3[x]  4[]  UN[]    7 Limb Ataxia 0[x]  1[]  2[]  3[]  UN[]     8 Sensory 0[x]  1[]  2[]  UN[]      9 Best Language 0[]  1[]  2[x]  3[]      10 Dysarthria 0[]  1[]  2[x]  UN[]      11 Extinct. and Inattention 0[]  1[]  2[]       TOTAL:       ROS  Unable to assess secondary to patient's mental status   Past History   Past Medical History:  Diagnosis Date   Arthritis    "knees, legs" (06/10/2016)   Ascites    Chronic kidney disease    "related to my diabetes"   Chronic lower back pain    Dementia without behavioral disturbance (HCC)    Diabetes mellitus without complication (HCC)    Eczema    GERD (gastroesophageal reflux disease)    High cholesterol    Hyperlipidemia    Hypertension    Hypothyroidism    OAB (overactive bladder)    Osteoporosis    Thyroid disease    Type II diabetes mellitus (HCC)    Urticaria     Past Surgical History:  Procedure Laterality Date   ABDOMINAL AORTOGRAM W/LOWER EXTREMITY N/A 02/08/2023   Procedure: ABDOMINAL AORTOGRAM W/LOWER EXTREMITY;  Surgeon: Chuck Hint, MD;  Location: [facility] INVASIVE CV LAB;  Service: Vascular;  Laterality:  N/A;   ABDOMINAL HYSTERECTOMY     KNEE ARTHROSCOPY     LAPAROTOMY N/A 12/03/2016   Procedure: EXPLORATORY LAPAROTOMY, LYSIS OF ADHESIONS;  Surgeon: Darnell Level, MD;  Location: [facility] ORS;  Service: General;  Laterality: N/A;   LOOP RECORDER INSERTION N/A 08/23/2019   Procedure: LOOP RECORDER INSERTION;  Surgeon: Marinus Maw, MD;  Location: [facility] INVASIVE CV LAB;  Service: Cardiovascular;  Laterality: N/A;   PERIPHERAL VASCULAR INTERVENTION  02/08/2023   Procedure: PERIPHERAL VASCULAR INTERVENTION;  Surgeon: Chuck Hint, MD;  Location: [facility] INVASIVE CV LAB;  Service: Vascular;;   vocal cord polyps      Family History: Family History  Problem Relation Age of Onset   Diabetes Mother    Hypertension Mother    Asthma Father     Diabetes Sister    Stomach cancer Sister    Diabetes Brother    Allergic rhinitis Neg Hx    Eczema Neg Hx    Urticaria Neg Hx     Social History  reports that she has never smoked. She quit smokeless tobacco use about 39 years ago.  Her smokeless tobacco use included chew. She reports that she does not drink alcohol and does not use drugs.  Allergies  Allergen Reactions   Nsaids Other (See Comments)    No NSAIDS due to kidney function- ESPECIALLY ibuprofen or Aleve   Biaxin [Clarithromycin] Hives   Statins Other (See Comments)    Muscle pain (myositis per daugher)   Bactrim [Sulfamethoxazole-Trimethoprim] Hives and Rash   Lisinopril Cough   Sulfa Antibiotics Hives, Rash and Other (See Comments)    NO sulfa-based meds!!     Medications   Current Facility-Administered Medications:    0.9 %  sodium chloride infusion, , Intravenous, Continuous, Sundil, Subrina, MD, Last Rate: 40 mL/hr at 06/14/23 2121, New Bag at 06/14/23 2121   0.9 %  sodium chloride infusion, 250 mL, Intravenous, PRN, Janalyn Shy, Subrina, MD   hydrALAZINE (APRESOLINE) injection 5 mg, 5 mg, Intravenous, Q8H PRN, Sundil, Subrina, MD   levETIRAcetam (KEPPRA) IVPB 500 mg/100 mL premix, 500 mg, Intravenous, Q12H, Sundil, Subrina, MD, Stopped at 06/14/23 2337   levothyroxine (SYNTHROID, LEVOTHROID) injection 75 mcg, 75 mcg, Intravenous, Daily, Sundil, Subrina, MD   LORazepam (ATIVAN) injection 1 mg, 1 mg, Intravenous, Q6H PRN, Sundil, Subrina, MD   sodium chloride flush (NS) 0.9 % injection 3 mL, 3 mL, Intravenous, Q12H, Sundil, Subrina, MD   sodium chloride flush (NS) 0.9 % injection 3 mL, 3 mL, Intravenous, Q12H, Sundil, Subrina, MD   sodium chloride flush (NS) 0.9 % injection 3 mL, 3 mL, Intravenous, PRN, Janalyn Shy, Subrina, MD  Current Outpatient Medications:    acetaminophen (TYLENOL) 650 MG CR tablet, Take 650 mg by mouth every 8 (eight) hours as needed for pain., Disp: , Rfl:    BAYER LOW DOSE 81 MG EC tablet, Take 81  mg by mouth in the morning. Swallow whole., Disp: , Rfl:    cetirizine (ZYRTEC) 10 MG tablet, Take 1 tablet (10 mg total) by mouth daily as needed for allergies or rhinitis., Disp: , Rfl:    Cholecalciferol (VITAMIN D-3 PO), Take 2,000 Units by mouth daily., Disp: , Rfl:    clopidogrel (PLAVIX) 75 MG tablet, Take 1 tablet (75 mg total) by mouth daily with breakfast., Disp: 30 tablet, Rfl: 0   CRANBERRY PO, Take 15 mLs by mouth daily. Cystex Liquid: UTI Probiotic 1 tablespoon once a day, Disp: , Rfl:    ezetimibe (ZETIA) 10  MG tablet, Take 1 tablet (10 mg total) by mouth at bedtime., Disp: , Rfl:    famotidine (PEPCID) 20 MG tablet, Take 20 mg by mouth at bedtime., Disp: , Rfl:    ferrous sulfate 220 (44 Fe) MG/5ML solution, Take 330 mg by mouth 3 (three) times a week. Take Tues, Thurs, Fri, Disp: , Rfl:    folic acid (FOLVITE) 1 MG tablet, Take 1 mg by mouth at bedtime., Disp: , Rfl:    insulin aspart (NOVOLOG FLEXPEN) 100 UNIT/ML FlexPen, Inject 2-10 Units into the skin See admin instructions. Inject 2-10 units into the skin three times a day with meals, per sliding scale:  Breakfast: BGL 80-199 = 8 units; 200-299 = 9 units; 300 or greater = 10 units Lunch: BGL 80-199 = 5 units; 200-299 = 6 units; 300 or greater = 7 units Supper/evening meal: BGL 80-199 = 2 units; 200-299 = 3 units; 300 or greater = 4 units, Disp: , Rfl:    insulin detemir (LEVEMIR FLEXTOUCH) 100 UNIT/ML FlexPen, Inject 3-7 Units into the skin See admin instructions. Inject 7 units into the skin in the morning and 3 units at bedtime PRN, Disp: , Rfl:    levETIRAcetam (KEPPRA) 500 MG tablet, Take 500 mg by mouth every 12 (twelve) hours., Disp: , Rfl:    methotrexate (RHEUMATREX) 2.5 MG tablet, Take 1 tablet (2.5 mg total) by mouth once a week. Caution:Chemotherapy. Protect from light. (Patient taking differently: Take 12.5 mg by mouth every Monday. Caution:Chemotherapy. Protect from light.), Disp: 4 tablet, Rfl: 0   metoprolol tartrate  (LOPRESSOR) 25 MG tablet, Take 0.5 tablets (12.5 mg total) by mouth as needed (if Systolic number is over 160 for over 2 hours)., Disp: , Rfl:    midodrine (PROAMATINE) 5 MG tablet, Take 2.5 mg by mouth 3 (three) times daily with meals., Disp: , Rfl:    Multiple Vitamin (MULTIVITAMIN WITH MINERALS) TABS tablet, Take 1 tablet by mouth daily with breakfast. OneaDay 50+ adult multivitamin, Disp: , Rfl:    nystatin (MYCOSTATIN) 100000 UNIT/ML suspension, Take 5 mLs (500,000 Units total) by mouth 4 (four) times daily., Disp: 60 mL, Rfl: 2   PLAQUENIL 200 MG tablet, Take 200 mg by mouth at bedtime., Disp: , Rfl:    predniSONE (DELTASONE) 5 MG tablet, Take 5 mg by mouth daily with breakfast., Disp: , Rfl:    SYNTHROID 100 MCG tablet, Take 100 mcg by mouth daily before breakfast., Disp: , Rfl:    triamcinolone ointment (KENALOG) 0.1 %, Apply 1 Application topically 2 (two) times daily as needed (skin irritation- affected areas)., Disp: , Rfl:   Vitals   Vitals:   06/14/23 1647 06/14/23 1930 06/14/23 2318 06/14/23 2330  BP:  (!) 146/68 (!) 157/62 (!) 159/56  Pulse:  (!) 52 (!) 52 (!) 51  Resp:  10 13 14   Temp:  (!) 97.4 F (36.3 C) (!) 97.3 F (36.3 C)   TempSrc:  Axillary Axillary   SpO2:  98% 99% 99%  Weight: 45.5 kg     Height: 5\' 7"  (1.702 m)       Body mass index is 15.69 kg/m.  Physical Exam   Constitutional: Appears cachectic Psych: Minimally interactive Eyes: No scleral injection.  HENT: No OP obstruction.  Scattered white lesions on her tongue Head: Normocephalic.  Cardiovascular: Normal rate and regular rhythm.  Respiratory: Effort normal, non-labored breathing.  GI: Soft.  No distension. There is no tenderness.  Skin: WDI.   Neurologic Examination  Neuro: Mental Status: Patient is very sleepy but does open her eyes and briefly participates in some automatic speech such as replying "hey" to examiner.  At times follows some simple commands.  Paucity of speech.  Does not  name or repeat Cranial Nerves: II: Visual Fields are full. Pupils are equal, round, and reactive to light within the best of my ability to examine them as she preferred to keep her eyes closed III,IV, VI: EOMI without ptosis or diploplia.  V: Facial sensation is symmetric to temperature VII: Facial movement is symmetric.  VIII: hearing is intact to voice X: Uvula elevates symmetrically XI: Shoulder shrug is symmetric. XII: tongue is midline without atrophy or fasciculations.  Motor: Good antigravity effort in bilateral upper extremities.  The right arm is held extended for an extended period of time is often seen in waxy cataplexy.  Diffuse muscle wasting especially in the lower extremities Sensory: Sensation is symmetric to light touch and temperature in the arms and legs. Deep Tendon Reflexes: 2+ in the right biceps, otherwise diminished in the left biceps and bilateral patellae (left more than right) Plantars: Toes are downgoing on the right and upgoing/equivocal on the left Cerebellar: Unable to assess secondary to patient's mental status     Labs/Imaging/Neurodiagnostic studies    Basic Metabolic Panel: Recent Labs  Lab 06/14/23 1730 06/14/23 1750  NA 128* 131*  K 4.0 4.1  CL 92* 91*  CO2 29  --   GLUCOSE 210* 218*  BUN 16 15  CREATININE 0.83 0.90  CALCIUM 9.8  --     CBC: Recent Labs  Lab 06/14/23 1730 06/14/23 1750  WBC 18.6*  --   NEUTROABS 17.0*  --   HGB 9.2* 9.5*  HCT 27.9* 28.0*  MCV 98.9  --   PLT 309  --     Coagulation Studies: Recent Labs    06/14/23 1730  LABPROT 14.5  INR 1.1      Lipid Panel:  Lab Results  Component Value Date   LDLCALC 201 (H) 02/09/2023   HgbA1c:  Lab Results  Component Value Date   HGBA1C 8.3 (H) 05/03/2023   Urine Drug Screen:     Component Value Date/Time   LABOPIA NONE DETECTED 06/14/2023 1844   COCAINSCRNUR NONE DETECTED 06/14/2023 1844   LABBENZ NONE DETECTED 06/14/2023 1844   AMPHETMU NONE  DETECTED 06/14/2023 1844   THCU NONE DETECTED 06/14/2023 1844   LABBARB NONE DETECTED 06/14/2023 1844    Alcohol Level     Component Value Date/Time   ETH <10 06/14/2023 1730   INR  Lab Results  Component Value Date   INR 1.1 06/14/2023   APTT  Lab Results  Component Value Date   APTT 33 06/14/2023   AED levels:  Lab Results  Component Value Date   LEVETIRACETA 26.7 02/14/2023    CT angio Head and Neck with contrast(Personally reviewed): Pending   MRI Brain(Personally reviewed):  1. 1.8 cm acute to early subacute ischemic nonhemorrhagic infarct involving the ventral right thalamocapsular region. 2. Additional small acute to early subacute infarct involving the inferior left cerebellum. 3. Few small nodular foci of enhancement involving the bilateral thalami. While these findings are nonspecific, changes are suspected to reflect enhancement related to evolving subacute ischemic changes. Short interval follow-up MRI to ensure these changes resolve recommended. 4. Underlying age-related cerebral atrophy with moderate chronic microvascular ischemic disease.  Neurodiagnostics rEEG:   ASSESSMENT   Most likely explanation for the patient's symptoms are delirium on a  background of dementia in the setting of significant leukocytosis as well as hyperglycemia and hyponatremia.  Stroke would be expected to cause right-sided dysmetria (cerebellar stroke), as well as left-sided weakness/sensory symptoms.  However in the setting of dementia  RECOMMENDATIONS   # Posterior circulation embolic appearing stroke - Stroke labs HgbA1c, fasting lipid panel - MRI brain to be repeated in 3 to 6 months or sooner if there is further neurological decline - CTA or MRA of the brain without contrast and Carotid duplex or MRA neck w/wo  - Frequent neuro checks - Echocardiogram - Continue home dual antiplatelet therapy for now; aspirin 300 mg PR ONLY IF no oral access - If no clear source  on vessel imaging, consider discussion with family and cardiology whether loop recorder could be interrogated at this time - Risk factor modification - Telemetry monitoring - Blood pressure goal   - Normotension given she is out of the permissive hypertension window - PT consult, OT consult, Speech consult, unless patient is back to baseline - Stroke team to follow  # Cachexia -B12, thiamine levels -Discussed with family loss of interest in eating tends to be an end-stage sign in dementia; they note they have already had conversations with palliative care etc. they would not be interested in a feeding tube given the lack of clear documented -Consider empiric supplementation pending levels being drawn  ______________________________________________________________________    Nunzio Cory MD-PhD [facility] [phone] Available 7 PM to 7 AM, outside of these hours please call Neurologist on call as listed on Amion.

## 2023-06-15 NOTE — Consult Note (Signed)
WOC Nurse Consult Note: Reason for Consult: Requested to assess a right toe with a vascular disorder Wound type: Callus. There is no open wound. Measurement: 0.5X0.5cm  Dressing procedure/placement/frequency: Foam dressing to protect the site. Change every 3 days or if is wet.  WOC team will not plan to follow further.  Please reconsult if further assistance is needed. Thank-you,  Denyse Amass BSN, RN, [employer], WOC  (Pager: [phone])

## 2023-06-15 NOTE — Plan of Care (Signed)
  Problem: Education: Goal: Knowledge of General Education information will improve Description: Including pain rating scale, medication(s)/side effects and non-pharmacologic comfort measures Outcome: Progressing   Problem: Health Behavior/Discharge Planning: Goal: Ability to manage health-related needs will improve Outcome: Progressing   Problem: Clinical Measurements: Goal: Ability to maintain clinical measurements within normal limits will improve Outcome: Progressing Goal: Will remain free from infection Outcome: Progressing Goal: Diagnostic test results will improve Outcome: Progressing Goal: Respiratory complications will improve Outcome: Progressing Goal: Cardiovascular complication will be avoided Outcome: Progressing   Problem: Activity: Goal: Risk for activity intolerance will decrease Outcome: Progressing   Problem: Nutrition: Goal: Adequate nutrition will be maintained Outcome: Progressing   Problem: Coping: Goal: Level of anxiety will decrease Outcome: Progressing   Problem: Elimination: Goal: Will not experience complications related to bowel motility Outcome: Progressing Goal: Will not experience complications related to urinary retention Outcome: Progressing   Problem: Pain Management: Goal: General experience of comfort will improve Outcome: Progressing   Problem: Safety: Goal: Ability to remain free from injury will improve Outcome: Progressing   Problem: Skin Integrity: Goal: Risk for impaired skin integrity will decrease Outcome: Progressing   Problem: Education: Goal: Ability to describe self-care measures that may prevent or decrease complications (Diabetes Survival Skills Education) will improve Outcome: Progressing Goal: Individualized Educational Video(s) Outcome: Progressing   Problem: Coping: Goal: Ability to adjust to condition or change in health will improve Outcome: Progressing   Problem: Fluid Volume: Goal: Ability to  maintain a balanced intake and output will improve Outcome: Progressing   Problem: Health Behavior/Discharge Planning: Goal: Ability to identify and utilize available resources and services will improve Outcome: Progressing Goal: Ability to manage health-related needs will improve Outcome: Progressing   Problem: Metabolic: Goal: Ability to maintain appropriate glucose levels will improve Outcome: Progressing   Problem: Nutritional: Goal: Maintenance of adequate nutrition will improve Outcome: Progressing Goal: Progress toward achieving an optimal weight will improve Outcome: Progressing   Problem: Skin Integrity: Goal: Risk for impaired skin integrity will decrease Outcome: Progressing   Problem: Tissue Perfusion: Goal: Adequacy of tissue perfusion will improve Outcome: Progressing   Problem: Education: Goal: Knowledge of disease or condition will improve Outcome: Progressing Goal: Knowledge of secondary prevention will improve (MUST DOCUMENT ALL) Outcome: Progressing Goal: Knowledge of patient specific risk factors will improve Loraine Leriche N/A or DELETE if not current risk factor) Outcome: Progressing   Problem: Ischemic Stroke/TIA Tissue Perfusion: Goal: Complications of ischemic stroke/TIA will be minimized Outcome: Progressing   Problem: Coping: Goal: Will verbalize positive feelings about self Outcome: Progressing Goal: Will identify appropriate support needs Outcome: Progressing   Problem: Health Behavior/Discharge Planning: Goal: Ability to manage health-related needs will improve Outcome: Progressing Goal: Goals will be collaboratively established with patient/family Outcome: Progressing   Problem: Self-Care: Goal: Ability to participate in self-care as condition permits will improve Outcome: Progressing Goal: Verbalization of feelings and concerns over difficulty with self-care will improve Outcome: Progressing Goal: Ability to communicate needs accurately  will improve Outcome: Progressing   Problem: Nutrition: Goal: Risk of aspiration will decrease Outcome: Progressing Goal: Dietary intake will improve Outcome: Progressing

## 2023-06-15 NOTE — Progress Notes (Signed)
Provider in room. Tech waiting outside room

## 2023-06-15 NOTE — Progress Notes (Addendum)
STROKE TEAM PROGRESS NOTE   BRIEF HPI [name] [name] is a 89 y.o. with a past medical history significant for hypertension, hyperlipidemia (not on statin secondary to dermatomyositis, for which she is on chronic prednisone and Plaquenil), insulin-dependent diabetes type 2, peripheral vascular disease, possible seizures, hypothyroidism, recurrent syncopal events, cryptogenic stroke in 2021 . Marland Kitchen   NIH on Admission: 17 (Decreased loc, confused, followed commands x1, weak BUE, very weak BLE, aphasia and dysarthria) mRs: 5 Out of window for IV thrombolysis or EVT.   SIGNIFICANT HOSPITAL EVENTS MRI 1.8 cm acute/early subacute ischemic nonhemorrhagic infarct, ventral right thalamocapsular region, inferior left cerebellum, few sm  INTERIM HISTORY/SUBJECTIVE Daughters bedside and contributive to history. Over the last week patient has been declining mentally from her baseline and the last 2 days it was significant enough that they wanted her to be evaluated. At baseline she is dependent on them for most iADLs. Report suspected myositis in setting of statin use or covid. Patient has been on zetia and has tolerated it fine. Patient had a loop recorder placed due to vasovagal syncopal work up, the daughters report the settings were incorrect and the recorder never detected any signs of afib. As a result, they discontinued the monitoring subscription 2 years ago.   Loop recorder interrogated w/ one questionable episode of AF on 11/06/22 but was not convinced that was even AF (and episode lasted 2 min in April).  Start Plavix and ASA tomorrow  SLP Dys 1 diet  Lower Extremity US pending  ECHO EF 60-65%, mild concentric LVH, Grade I diastolic dysfunction, negative bubble study    OBJECTIVE  CBC    Component Value Date/Time   WBC 10.4 06/15/2023 0330   RBC 3.35 (L) 06/15/2023 0330   HGB 11.0 (L) 06/15/2023 0330   HGB 11.2 (L) 10/10/2008 1305   HCT 33.0 (L) 06/15/2023 0330   HCT 33.3 (L) 10/10/2008 1305    PLT 241 06/15/2023 0330   PLT 381 10/10/2008 1305   MCV 98.5 06/15/2023 0330   MCV 89.6 10/10/2008 1305   MCH 32.8 06/15/2023 0330   MCHC 33.3 06/15/2023 0330   RDW 14.4 06/15/2023 0330   RDW 13.3 10/10/2008 1305   LYMPHSABS 0.9 06/14/2023 1730   LYMPHSABS 0.9 10/10/2008 1305   MONOABS 0.5 06/14/2023 1730   MONOABS 0.3 10/10/2008 1305   EOSABS 0.0 06/14/2023 1730   EOSABS 0.0 10/10/2008 1305   BASOSABS 0.0 06/14/2023 1730   BASOSABS 0.0 10/10/2008 1305    BMET    Component Value Date/Time   NA 133 (L) 06/15/2023 0607   K 3.6 06/15/2023 0330   CL 97 (L) 06/15/2023 0330   CO2 29 06/15/2023 0330   GLUCOSE 141 (H) 06/15/2023 0330   BUN 14 06/15/2023 0330   CREATININE 0.74 06/15/2023 0330   CREATININE 0.98 09/25/2014 1654   CALCIUM 9.7 06/15/2023 0330   GFRNONAA >60 06/15/2023 0330   GFRNONAA 55 (L) 09/25/2014 1654    IMAGING past 24 hours CT ANGIO HEAD NECK W WO CM  Result Date: 06/15/2023 CLINICAL DATA:  Stroke workup EXAM: CT ANGIOGRAPHY HEAD AND NECK WITH AND WITHOUT CONTRAST TECHNIQUE: Multidetector CT imaging of the head and neck was performed using the standard protocol during bolus administration of intravenous contrast. Multiplanar CT image reconstructions and MIPs were obtained to evaluate the vascular anatomy. Carotid stenosis measurements (when applicable) are obtained utilizing NASCET criteria, using the distal internal carotid diameter as the denominator. RADIATION DOSE REDUCTION: This exam was performed according to the  departmental dose-optimization program which includes automated exposure control, adjustment of the mA and/or kV according to patient size and/or use of iterative reconstruction technique. CONTRAST:  75mL OMNIPAQUE IOHEXOL 350 MG/ML SOLN COMPARISON:  Brain MRI from yesterday FINDINGS: CT HEAD FINDINGS Brain: Known acute infarcts at the right thalamocapsular junction and inferior left cerebellum by MRI. No acute hemorrhage, hydrocephalus, mass, or  collection. Vascular: No hyperdense vessel or unexpected calcification. Skull: Normal. Negative for fracture or focal lesion. Sinuses/Orbits: No acute finding. Review of the MIP images confirms the above findings CTA NECK FINDINGS Aortic arch: Atheromatous calcification with 3 vessel branching. No acute finding or covered aneurysm Right carotid system: Mild for age atheromatous plaque mainly at the bifurcation without stenosis. No ulceration or dissection. Mild ICA beading is possible. Left carotid system: Atheromatous calcification at the bifurcation without stenosis or beading. No dissection. Vertebral arteries: No proximal subclavian stenosis. Atheromatous plaque at the right vertebral origin. No vertebral beading, flow reducing stenosis, or dissection. Skeleton: Generalized degenerative endplate and facet spurring. No acute or aggressive finding Other neck: No acute finding Upper chest: No acute finding Review of the MIP images confirms the above findings CTA HEAD FINDINGS Anterior circulation: Atheromatous calcification of the cavernous carotids. No major branch occlusion, beading, or aneurysm. Atheromatous irregularity of the MCA branches with multifocal moderate M2 narrowing on both sides. Moderate right M1 origin stenosis, see coronal thick MIPS. Posterior circulation: The vertebral and basilar arteries are smoothly contoured and widely patent. Atheromatous irregularity of the posterior cerebral arteries with moderate stenosis at the right P2 and P3 levels. Venous sinuses: Unremarkable for arterial timing Anatomic variants: As above Review of the MIP images confirms the above findings IMPRESSION: 1. No emergent arterial finding. 2. Advanced intracranial atherosclerosis with multifocal moderate branch stenosis. 3. Atherosclerosis in the neck without flow reducing stenosis. 4. No intracranial hemorrhage. No visible progression of acute infarcts seen by MRI. Electronically Signed   By: Tiburcio Pea M.D.    On: 06/15/2023 06:51   MR BRAIN W WO CONTRAST  Result Date: 06/15/2023 CLINICAL DATA:  Initial evaluation for acute altered mental status. EXAM: MRI HEAD WITHOUT AND WITH CONTRAST TECHNIQUE: Multiplanar, multiecho pulse sequences of the brain and surrounding structures were obtained without and with intravenous contrast. CONTRAST:  4.56mL GADAVIST GADOBUTROL 1 MMOL/ML IV SOLN COMPARISON:  CT from earlier the same day. FINDINGS: Brain: Examination degraded by motion artifact. Age-related cerebral atrophy. Patchy and confluent T2/FLAIR hyperintensity involving the periventricular deep white matter both cerebral hemispheres, consistent with chronic small vessel ischemic disease, moderately advanced in nature. Small remote left cerebellar infarct noted. 1.8 cm focus of restricted diffusion involving the ventral right thalamocapsular region, consistent with an evolving acute to early subacute ischemic infarct (series 5, image 23). Additional small acute to early subacute infarct involving the inferior left cerebellum (series 5, image 7). No associated hemorrhage or significant mass effect. No other diffusion signal abnormality identified. However, there are a few small nodular foci of enhancement involving the bilateral thalami (series 16, images 72, 69). While these findings are nonspecific, changes are suspected to reflect enhancement related to evolving subacute ischemic changes. Gray-white matter differentiation otherwise maintained. No acute intracranial hemorrhage. No significant chronic intracranial blood products visible on this motion degraded exam. No mass lesion, midline shift, or mass effect. Ventricular prominence related global parenchymal volume loss without hydrocephalus. No extra-axial fluid collection. Pituitary gland suprasellar region within normal limits. No other abnormal enhancement. Vascular: Major intracranial vascular flow voids are maintained. Skull and upper  cervical spine: Degenerative  thickening noted about the tectorial membrane. Craniocervical junction otherwise unremarkable. Bone marrow signal intensity within normal limits. No scalp soft tissue abnormality. Sinuses/Orbits: Prior bilateral ocular lens replacement. Paranasal sinuses are largely clear. Small bilateral mastoid effusions, of doubtful significance. Visualized nasopharynx unremarkable. Other: None. IMPRESSION: 1. 1.8 cm acute to early subacute ischemic nonhemorrhagic infarct involving the ventral right thalamocapsular region. 2. Additional small acute to early subacute infarct involving the inferior left cerebellum. 3. Few small nodular foci of enhancement involving the bilateral thalami. While these findings are nonspecific, changes are suspected to reflect enhancement related to evolving subacute ischemic changes. Short interval follow-up MRI to ensure these changes resolve recommended. 4. Underlying age-related cerebral atrophy with moderate chronic microvascular ischemic disease. Electronically Signed   By: Rise Mu M.D.   On: 06/15/2023 01:34   CT HEAD WO CONTRAST  Result Date: 06/14/2023 CLINICAL DATA:  Altered mental status EXAM: CT HEAD WITHOUT CONTRAST TECHNIQUE: Contiguous axial images were obtained from the base of the skull through the vertex without intravenous contrast. RADIATION DOSE REDUCTION: This exam was performed according to the departmental dose-optimization program which includes automated exposure control, adjustment of the mA and/or kV according to patient size and/or use of iterative reconstruction technique. COMPARISON:  05/18/2023 FINDINGS: Brain: There is atrophy and chronic small vessel disease changes. No acute intracranial abnormality. Specifically, no hemorrhage, hydrocephalus, mass lesion, acute infarction, or significant intracranial injury. Vascular: No hyperdense vessel or unexpected calcification. Skull: No acute calvarial abnormality. Sinuses/Orbits: No acute findings Other:  None IMPRESSION: Atrophy, chronic microvascular disease. No acute intracranial abnormality. Electronically Signed   By: Charlett Nose M.D.   On: 06/14/2023 17:58   DG Chest Portable 1 View  Result Date: 06/14/2023 CLINICAL DATA:  Loss of consciousness.  Altered mental status. EXAM: PORTABLE CHEST 1 VIEW COMPARISON:  AP chest 05/21/2023, 03/16/2023, 10/14/2022 FINDINGS: Cardiac silhouette and mediastinal contours are unchanged and within normal limits. Moderate calcification within the aortic arch. Left chest wall cardiac loop recorder is again noted. Improved aeration of the right lung. No definite acute lung opacity. The current lung appearance is similar to baseline 10/14/2022 radiographs. No pleural effusion pneumothorax. No acute skeletal abnormality. IMPRESSION: Resolution of the subtle right lung airspace opacities seen on most recent 05/21/2023 radiographs. No definite acute lung opacity. Electronically Signed   By: Neita Garnet M.D.   On: 06/14/2023 17:28    Vitals:   06/15/23 0337 06/15/23 0345 06/15/23 0438 06/15/23 0750  BP:  (!) 157/61 (!) 165/62 (!) 147/70  Pulse:  (!) 55  61  Resp:  14  15  Temp: (!) 95.9 F (35.5 C)  (!) 97.3 F (36.3 C)   TempSrc: Rectal  Oral   SpO2:  100%  95%  Weight:   46.1 kg   Height:       PHYSICAL EXAM  NEURO:  Pt initially drowsy sleepy but then arouse with stimulation. She was able to main wakefulness during the encounter. Her eyes open, orientated to age, place and people, but not to time. Psychomotor slowing with bradyphonia. Very limited language output and answer question with words, slow responding. Following most simple commands. Able to name 3/5 but did not repeat sentences with me. No gaze palsy, tracking bilaterally, blinking to visual threat bilaterally. No significant facial droop. Tongue protrusion not cooperative. Bilateral UEs proximal not willing to lift up but bicep 3+/5 bilaterally. Bilaterally LEs slight withdraw to pain bilaterally  seems symmetrical, mildly increased muscle tone. Sensation, coordination and  gait not tested     ASSESSMENT/PLAN  Stroke - Acute/subacute infarct in the right thalamocapsular region, small/subacute infarct in inferior left cerebellum Etiology: Advanced intracranial atherosclerosis w/ multifocal moderate branch stenosis and uncontrolled risk factors CT Head No acute intracranial abnormalities. Atrophy and chronic microvascular disease.  CTA head & neck Advanced intracranial atherosclerosis with multifocal moderate branch stenosis. Atherosclerosis in the neck without flow reducing stenosis. MRI  1.8 cm acute to early subacute ischemic nonhemorrhagic infarct involving the ventral right thalamocapsular region. Additional small acute to early subacute infarct involving the inferior left cerebellum. 2D Echo EF 60-65%, negative bubble study   Lower extremity US no DVT EEG mild diffuse encephalopathy. No seizures   LDL 167 HgbA1c 8.5 UDS negative  EEG mild diffuse encephalopathy, negative for seizures  VTE prophylaxis - Lovenox  On plavix at home, now on Aspirin 81 and plavix DAPT for 3 months and then plavix alone given advance intracranial stenosis.   Therapy recommendations:  Pending Disposition:  pending   Hx of stroke/TIA and seizure 08/2019 admitted for right arm weakness, diaphoresis, LOC.  CT/MRI/MRA/carotid Doppler negative.  EF 60 to 60%.  EEG negative for seizure.  LDL 133, A1c 8.6.  Discharged on DAPT and fish oil.  Loop recorder placed. 09/2019 admitted for altered mental status, aphasia, status post tPA.  CT negative.  CT head and neck diffuse intracranial stenosis.  MRI negative for stroke.  EEG no seizure.  No DVT.  Long-term EEG no seizure.  Loop recorder interrogation no A-fib.  EF 55 to 70%.  LDL 142, A1c 8.7.  Patient symptoms concerning for seizure and prolonged postictal period discharged on Keppra and aspirin. 08/2022 admitted for right gaze, unresponsiveness.  MRI no acute  infarct.  EEG no seizure.  Increase Keppra from 500-750.  ?Atrial fibrillation Loop recorder placed in 2021, however per family, has difficulty with device setting and not successful recording Family optioned out of loop recorder monitoring in 2023 loop recorder interrogated today showed a questionable alarm of afib in 10/2022 for 2 minutes but none since.  Per urology, not convincing for true A-fib episode. Continue telemetry monitoring No anticoagulation warranted at this time  Hypertension Home meds:  Metoprolol tartrate Stable on the high end Holding home metoprolol at the moment HR in the 50s  Hydralazine prn SBP > 180 Normotensive BP long-term goal   Hyperlipidemia Home meds:  Zetia, resumed in hospital  LDL 167, goal < 70 Continue home zetia  Patient intolerant to statins per daughters   Daughters willing to discuss Leqvio  Diabetes type II Uncontrolled Home meds: Novolog flex pen and detemir  HgbA1c 8.5, goal < 7.0 CBGs SSI Recommend close follow-up with PCP for better DM control  Dysphagia Patient has post-stroke dysphagia SLP consulted Now on dysphagia 1 and seen liquid Advance diet as tolerated  Other Active Problems Seizures: EEG w/o seizure like activity, diffuse encephalopathy, home meds: keppra   Hospital day # 1   ATTENDING NOTE: I reviewed above note and agree with the assessment and plan. Pt was seen and examined.   Two daughters are at the bedside. Pt initially drowsy sleepy but then arouse with stimulation. She was able to main wakefulness during the encounter. Her eyes open, orientated to age, place and people, but not to time. Psychomotor slowing with bradyphonia. Very limited language output and answer question with words, slow responding. Following most simple commands. Able to name 3/5 but did not repeat sentences with me. No gaze palsy, tracking bilaterally, blinking  to visual threat bilaterally. No significant facial droop. Tongue protrusion not  cooperative. Bilateral UEs proximal not willing to lift up but bicep 3+/5 bilaterally. Bilaterally LEs slight withdraw to pain bilaterally seems symmetrical, mildly increased muscle tone. Sensation, coordination and gait not tested.   Patient MRI showed right thalamic and left cerebellum small infarcts, consistent with patient presenting symptoms.  Loop recorder interrogation showed questionable A-fib episode in 10/2022 about 2 minutes, however not convinced per cardiology.  Per daughters, patient loop recorder had device had any issues from the beginning and family has options out of continue with monitoring.  Do not feel at this time warranted for anticoagulation.  Patient stroke likely due to advanced intracranial stenosis and uncontrolled risk factors with hypertension and hyperlipidemia.  Patient has history of statin intolerance, continue Zetia family is willing to discuss Leqvio.  Continue DAPT for 3 months and then Plavix alone given advanced intracranial stenosis.  PT and OT pending.  Palliative care on board for GOC discussion  For detailed assessment and plan, please refer to above/below as I have made changes wherever appropriate.   Neurology will sign off. Please call with questions. Pt will follow up with Dr. Marjory Lies at [facility] in about 4 weeks. Thanks for the consult.   Marvel Plan, MD PhD Stroke Neurology 06/15/2023 5:19 PM   To contact Stroke Continuity provider, please refer to [email or web address]. After hours, contact General Neurology

## 2023-06-15 NOTE — Procedures (Signed)
Patient Name: [name] [name]  MRN: [id number]  Epilepsy Attending: Charlsie Quest  Referring Physician/Provider: Tereasa Coop, MD  Duration: 24.26 mins  Patient history: 89yo F with ams getting eeg to evaluate for seizure  Level of alertness: Awake, asleep  AEDs during EEG study: LEV  Technical aspects: This EEG study was done with scalp electrodes positioned according to the 10-20 International system of electrode placement. Electrical activity was reviewed with band pass filter of 1-70Hz , sensitivity of 7 uV/mm, display speed of 37mm/sec with a 60Hz  notched filter applied as appropriate. EEG data were recorded continuously and digitally stored.  Video monitoring was available and reviewed as appropriate.  Description: The posterior dominant rhythm consists of 8 Hz activity of moderate voltage (25-35 uV) seen predominantly in posterior head regions, symmetric and reactive to eye opening and eye closing. Sleep was characterized by vertex waves, sleep spindles (12 to 14 Hz), maximal frontocentral region. EEG showed intermittent generalized 3 to 6 Hz theta-delta slowing. Physiologic photic driving was seen during photic stimulation. Hyperventilation was not performed.     ABNORMALITY - Intermittent slow, generalized  IMPRESSION: This study is suggestive of mild diffuse encephalopathy. No seizures or epileptiform discharges were seen throughout the recording.  Marilu Rylander Annabelle Harman

## 2023-06-15 NOTE — Evaluation (Signed)
Clinical/Bedside Swallow Evaluation Patient Details  Name: [name] [name] MRN: [id number] Date of Birth: 1934-07-04  Today's Date: 06/15/2023 Time: SLP Start Time (ACUTE ONLY): 1124 SLP Stop Time (ACUTE ONLY): 1152 SLP Time Calculation (min) (ACUTE ONLY): 28 min  Past Medical History:  Past Medical History:  Diagnosis Date   Arthritis    "knees, legs" (06/10/2016)   Ascites    Chronic kidney disease    "related to my diabetes"   Chronic lower back pain    Dementia without behavioral disturbance (HCC)    Diabetes mellitus without complication (HCC)    Eczema    GERD (gastroesophageal reflux disease)    High cholesterol    Hyperlipidemia    Hypertension    Hypothyroidism    OAB (overactive bladder)    Osteoporosis    Thyroid disease    Type II diabetes mellitus (HCC)    Urticaria    Past Surgical History:  Past Surgical History:  Procedure Laterality Date   ABDOMINAL AORTOGRAM W/LOWER EXTREMITY N/A 02/08/2023   Procedure: ABDOMINAL AORTOGRAM W/LOWER EXTREMITY;  Surgeon: Chuck Hint, MD;  Location[facility] INVASIVE CV LAB;  Service: Vascular;  Laterality: N/A;   ABDOMINAL HYSTERECTOMY     KNEE ARTHROSCOPY     LAPAROTOMY N/A 12/03/2016   Procedure: EXPLORATORY LAPAROTOMY, LYSIS OF ADHESIONS;  Surgeon: Darnell Level, MD;  Location: [facility] ORS;  Service: General;  Laterality: N/A;   LOOP RECORDER INSERTION N/A 08/23/2019   Procedure: LOOP RECORDER INSERTION;  Surgeon: Marinus Maw, MD;  Location: [facility] INVASIVE CV LAB;  Service: Cardiovascular;  Laterality: N/A;   PERIPHERAL VASCULAR INTERVENTION  02/08/2023   Procedure: PERIPHERAL VASCULAR INTERVENTION;  Surgeon: Chuck Hint, MD;  Location: [facility] INVASIVE CV LAB;  Service: Vascular;;   vocal cord polyps     HPI:  [name] [name] is an 89 y.o. female, known to SLP services, who presented on 11/25 with AMS and dx of acute CVA. MRI: 1.8 cm acute to early subacute ischemic infarct involving right thalamocapsular region, small  acute to early subacute infarct in the inferior left cerebellum. Recent admission 10/29-11/4 for multifocal pna, concern for aspiration. Hx of pharyngo-cervical esophageal dysphagia, prior MBS 07/29/22.   Most recent  MBS 10/31: deteriorating swallow function with silent aspiration of thin/nectar thick liquids, penetration of honey-thick liquids and puree, poor propulsion through pharynx, significant residue. Aspiration of residue after the swallow.   Pt has been followed by Palliative Medicine and home hospice. During last admission, swallowing was discussed at length with pt's daughters. They have been caring for their mother at home and have an excellent understanding of their mother's swallowing issues, the concerns around progressing dysphagia and the adverse impact of aspiration.  A decision was made to allow regular solids/thin liquids so that family could select foods that their mother could manage and that she would enjoy.   PMH significant for  insulin-dependent T2DM, dermatomyositis, PAD, history of syncope due to orthostasis and vasovagal episodes, seizure, hypothyroidism, and macrocytic anemia.    Assessment / Plan / Recommendation  Clinical Impression  [name] [name]' swallowing today is characterized by fewer concerning s/s of dysphagia than when last seen by SLP.  Her daughters were present for assessment. HOB elevated and pt repositioned. With careful and slow hand-feeding provided, pt drank 4 oz of juice from a straw and was fed a container of pudding with only one mild cough noted, multiple sub-swallows with each bite of food, but no displays of discomfort or displeasure.  After discussion  with daughters, we agreed to start a dysphagia 1 diet with thin liquids for now.  Crush meds.  Provide careful and slow hand-feeding and ensure oral cavity is empty prior to leaving pt alone in room. Family agrees with plan. SLP will follow while admitted.  [name] [name] is really doing very well with  eating/swallowing despite her hx of dysphagia. SLP Visit Diagnosis: Dysphagia, oropharyngeal phase (R13.12)    Aspiration Risk  Moderate aspiration risk    Diet Recommendation   Thin;Dysphagia 1 (puree)  Medication Administration: Crushed with puree    Other  Recommendations Oral Care Recommendations: Oral care BID    Recommendations for follow up therapy are one component of a multi-disciplinary discharge planning process, led by the attending physician.  Recommendations may be updated based on patient status, additional functional criteria and insurance authorization.  Follow up Recommendations No SLP follow up      Assistance Recommended at Discharge    Functional Status Assessment    Frequency and Duration min 2x/week  1 week       Prognosis Prognosis for improved oropharyngeal function: Good      Swallow Study   General Date of Onset: 06/14/23 HPI: [name] [name] is an 89 y.o. female, known to SLP services, who presented on 11/25 with AMS and dx of acute CVA. MRI: 1.8 cm acute to early subacute ischemic infarct involving right thalamocapsular region, small acute to early subacute infarct in the inferior left cerebellum. Recent admission 10/29-11/4 for multifocal pna, concern for aspiration. Hx of pharyngo-cervical esophageal dysphagia, prior MBS 07/29/22. Most recent  MBS 10/31: deteriorating swallow function with silent aspiration of thin/nectar thick liquids, penetration of honey-thick liquids and puree, poor propulsion through pharynx, significant residue. Aspiration of residue after the swallow. Pt has been followed by Palliative Medicine and home hospice. During last admission, swallowing was discussed at length with pt's daughters. They have been caring for their mother at home and have an excellent understanding of their mother's swallowing issues, the concerns around progressing dysphagia and the adverse impact of aspiration.  A decision was made to allow regular solids/thin  liquids so that family could select foods that their mother could manage and that she would enjoy. PMH significant for  insulin-dependent T2DM, dermatomyositis, PAD, history of syncope due to orthostasis and vasovagal episodes, seizure, hypothyroidism, and macrocytic anemia. Type of Study: Bedside Swallow Evaluation Previous Swallow Assessment: See HPI Diet Prior to this Study: NPO Temperature Spikes Noted: No Respiratory Status: Room air History of Recent Intubation: No Behavior/Cognition: Alert;Pleasant mood Oral Cavity Assessment: Within Functional Limits Oral Care Completed by SLP: No Oral Cavity - Dentition: Edentulous Self-Feeding Abilities: Total assist Patient Positioning: Upright in bed Baseline Vocal Quality: Low vocal intensity Volitional Cough: Cognitively unable to elicit Volitional Swallow: Unable to elicit    Oral/Motor/Sensory Function Overall Oral Motor/Sensory Function: Within functional limits   Ice Chips Ice chips: Not tested   Thin Liquid Thin Liquid: Within functional limits Presentation: Straw    Nectar Thick Nectar Thick Liquid: Not tested   Honey Thick Honey Thick Liquid: Not tested   Puree Puree: Within functional limits   Solid     Solid: Not tested      [name] [name] [name] [name] 06/15/2023,12:15 PM  [name] Folks L. Samson Frederic, MA CCC/SLP Clinical Specialist - Acute Care SLP Acute Rehabilitation Services Office number [phone]

## 2023-06-15 NOTE — Progress Notes (Signed)
  CTA head and neck IMPRESSION: 1. No emergent arterial finding. 2. Advanced intracranial atherosclerosis with multifocal moderate branch stenosis. 3. Atherosclerosis in the neck without flow reducing stenosis. 4. No intracranial hemorrhage. No visible progression of acute infarcts seen by MRI.  -CTA head and neck no emergent arterial occlusion.   Tereasa Coop, MD [facility] 06/15/2023, 6:54 AM

## 2023-06-15 NOTE — Progress Notes (Signed)
  Echocardiogram 2D Echocardiogram has been performed.  [name] [name] 06/15/2023, 2:13 PM

## 2023-06-15 NOTE — Consult Note (Signed)
Consultation Note Date: 06/15/2023   Patient Name: [name] [name]  DOB: May 22, 1934  MRN: [id number]  Age / Sex: 89 y.o., female  PCP: [name], MD Referring Physician: Cathren Harsh, MD  Reason for Consultation: Establishing goals of care  HPI/Patient Profile: 89 y.o. female  with past medical history of dementia, insulin-dependent DM type II, dermatomyositis (on Plaquenil and chronic prednisone 5 mg daily), peripheral vascular disease, hypertension, orthostatic hypotension, hypothyroidism, chronic wound of the bilateral toe, recurrent syncope and vasovagal syncope, seizure, hypothyroidism, and macrocytic anemia  admitted on 06/14/2023 with altered mental status.   Patient is admitted for acute metabolic encephalopathy due to acute CVA (MRI showed 1.8 cm acute to early subacute ischemic infarct involving right thalamocapsular region, additional small acute to early subacute infarct in the inferior left cerebellum.)  Of note, patient has had 7 admissions in the past 6 months. She is followed by outpatient palliative care after a brief hospice evaluation in January 2024. PMT has been consulted to assist with goals of care conversation.  Clinical Assessment and Goals of Care:  I have reviewed medical records including EPIC notes, labs and imaging, assessed the patient and then had a phone conversation with patient's daughter [name] to discuss diagnosis prognosis, GOC, EOL wishes, disposition and options.  I introduced Palliative Medicine as specialized medical care for people living with serious illness. It focuses on providing relief from the symptoms and stress of a serious illness. The goal is to improve quality of life for both the patient and the family.  We discussed a brief life review of the patient and then focused on their current illness.   I attempted to elicit values and goals of care important to the patient.    Medical History Review  and Understanding:  Patient's daughter has a good understanding of patient's chronic illnesses. We discussed her acute stroke and uncertainly of her recovery given these underlying chronic comorbidities. She understands patient's risk for further decline and debility.   Social History: Patient is supported by her two daughters. She is a Curator. Daughter is concerned patient may be feeling more sadness/depression lately, though patient will not talk about these feelings.  Functional and Nutritional State: For last 4 to 5 days before admission, patient's mentation has been progressively worsening and functional status has been declining. Albumin of 3.0 noted.  Code Status: DNR/DNI confirmed.  Discussion: Provided palliative support to patient's daughter as well as updates on recently completed echo, upcoming vascular ultrasound. We reviewed the conversations she has had thus far today with primary attending, neurology, and SLP. [name] notes that she is not feeling as bad as she was yesterday afternoon in regards to patient's prognosis. She was thinking the worst when she learned of patient's stroke diagnosis but she is relieved to see that patient has recognized family and enjoyed some interactions. She knows that anything can happen at anytime and already clearly indicates that she would not pursue aggressive interventions, such as a feeding tube, if patient declined further. She is hopeful for recovery.  We discussed patient's quality of life prior to this acute illness. Patient has made some concerning statements such as "I'm not worth two shits" and [name] also notes that she seemed to be crying one day, though she tried to hide it. [name] is concerned that she may be sad or depressed but doesn't want to burden family with these feelings. I validated the importance of addressing this and offered to attempt discussions with patient as able,  possibly coordinate a psychiatry evaluation as  appropriate. She is also interested in determining patient's ability to receive a type of "transit chair" for transfers which opens up and slides under the patient. Discussed possibility of PT assisting with recommendations when pending evaluation is completed. Discussed that they would also advise on need for PT services at home, in a facility etc. Patient remains enrolled in outpatient palliative services and will likely be due for a monthly visit soon. Reviewed their experience with hospice in the past. [name] clarifies that patient was evaluated by hospice, however it was not a great experience as the information received was neither clear nor correct. She would be open to revisiting a different hospice agency in the future if it came to that.   Discussed the importance of continued conversation with family and the medical providers regarding overall plan of care and treatment options, ensuring decisions are within the context of the patient's values and GOCs.   Questions and concerns were addressed. The family was encouraged to call with questions or concerns.  PMT will continue to support holistically.   SUMMARY OF RECOMMENDATIONS   -Continue DNR/DNI -Patient's family are hopeful that she can continue to enjoy an acceptable quality of life, interact with loved ones -If patient were to decline further, NO feeding tube. Family would consider transition to hospice, would prefer a different agency than previously -Ongoing GOC discussions pending clinical course -Psychosocial and emotional support provided -PMT will continue to follow and support   Prognosis:  Unable to determine  Discharge Planning: To Be Determined      Primary Diagnoses: Present on Admission:  Acute metabolic encephalopathy  Hyponatremia  Leukocytosis  Dermatomyositis (HCC)  PVD (peripheral vascular disease) (HCC)  Essential hypertension  Hypothyroidism  (Resolved) Pressure ulcer-bilateral toe.  (Resolved) AKI  (acute kidney injury) (HCC)   Physical Exam Vitals and nursing note reviewed.  Constitutional:      General: She is not in acute distress.    Appearance: She is ill-appearing.  Cardiovascular:     Rate and Rhythm: Normal rate.  Pulmonary:     Effort: Pulmonary effort is normal. No respiratory distress.  Skin:    General: Skin is cool and dry.  Neurological:     Mental Status: She is lethargic.    Vital Signs: BP (!) 139/47 (BP Location: Left Arm)   Pulse 60   Temp (!) 96 F (35.6 C) (Axillary)   Resp 15   Ht 5\' 7"  (1.702 m)   Wt 46.1 kg   SpO2 96%   BMI 15.92 kg/m  Pain Scale: PAINAD      SpO2: SpO2: 96 % O2 Device:SpO2: 96 % O2 Flow Rate: .    MDM: High    Yarel Rushlow Jeni Salles, PA-C  Palliative Medicine Team Team phone # [phone]  Thank you for allowing the Palliative Medicine Team to assist in the care of this patient. Please utilize secure chat with additional questions, if there is no response within 30 minutes please call the above phone number.  Palliative Medicine Team providers are available by phone from 7am to 7pm daily and can be reached through the team cell phone.  Should this patient require assistance outside of these hours, please call the patient's attending physician.

## 2023-06-15 NOTE — Progress Notes (Addendum)
Acute ischemic stroke: Acute metabolic encephalopathy:  Initially admitted this patient for acute metabolic encephalopathy.  Patient presenting with confusion and lethargy which is ongoing for last 7 days and last 2 days her mentation has been progressively decline per patient's family.  For the workup process for acute metabolic encephalopathy MRI obtained which showed: -IMPRESSION: 1. 1.8 cm acute to early subacute ischemic nonhemorrhagic infarct involving the ventral right thalamocapsular region. 2. Additional small acute to early subacute infarct involving the inferior left cerebellum. 3. Few small nodular foci of enhancement involving the bilateral thalami. While these findings are nonspecific, changes are suspected to reflect enhancement related to evolving subacute ischemic changes. Short interval follow-up MRI to ensure these changes resolve recommended. 4. Underlying age-related cerebral atrophy with moderate chronic microvascular ischemic disease.  Based on the MRI finding of ischemic stroke consulted teleneurology who recommended CTA head of neck, echocardiogram, lipid panel, A1c and TSH level.  Given patient is still altered and cannot give anything by mouth per neurology recommendation continuing aspirin 300 mg per rectal daily.  -Pending CTA head and neck. - Pending echocardiogram. -Continue per rectal aspirin 300 mg daily.  Given due to altered mentation unable to keep anything by mouth. -Spoke with our on-call neurology Dr. Iver Nestle who recommended to transfer patient to [facility] as neurology is physically over here to evaluate patient for stroke follow-up also patient needs video EEG monitoring rather than spot EEG.  Recommended to look for source for infection for leukocytosis. -So far patient is afebrile, unremarkable and chest x-ray showed resolution of stable right airspace opacity and no definite acute lung opacity.  No concern for pneumonia at this time. -  Obtaining blood cultures.  Of note, patient has history of peripheral vascular disease and at home patient takes aspirin 81 mg daily, Plavix 75 mg daily and ezetimibe.    CRITICAL CARE Performed by: Tereasa Coop, MD     Total critical care time: 30 minutes   Critical care time was exclusive of separately billable procedures and treating other patients.   Critical care was necessary to treat or prevent imminent or life-threatening deterioration.   Critical care was time spent personally by me on the following activities: development of treatment plan with patient and/or surrogate as well as nursing, discussions with consultants, evaluation of patient's response to treatment, examination of patient, obtaining history from patient or surrogate, ordering and performing treatments and interventions, ordering and review of laboratory studies, ordering and review of radiographic studies, pulse oximetry and re-evaluation of patient's condition.

## 2023-06-15 NOTE — Progress Notes (Signed)
EEG complete - results pending 

## 2023-06-15 NOTE — Progress Notes (Signed)
[facility]                                                                              [name] [name], is a 89 y.o. female, DOB - [birth year], ZOX:[id number] Admit date - 06/14/2023    Outpatient Primary MD for the patient is [name], MD  LOS - 1  days  Chief Complaint  Patient presents with   Fatigue       Brief summary   Patient is a 89 year old female with dementia, IDDM type II, dermatomyositis on Plaquenil and chronic prednisone 5 mg daily, PVD, HTN, orthostatic hypotension, hypothyroidism, chronic wounds of the bilateral toes, recurrent syncope, seizure, hypothyroidism, macrocytic anemia presented to ED with altered mental status.  Patient's daughter reported that patient's mentation has been progressively worsening in the last 4 to 5 days prior to admission and her functional status had also been declining.  In the last 2 days patient has been more sleepy and lethargic. Patient had been recently hospitalized 10/29-11/4 for multifocal right-sided pneumonia possible aspiration  MRI brain showed 1.8 cm acute to early subacute ischemic infarct involving right thalamocapsular region, additional small acute to early subacute infarct in the inferior left cerebellum Neurology was consulted and patient was admitted for further workup.  Assessment & Plan    Principal Problem:   Acute metabolic encephalopathy likely due to acute CVA - MRI brain showed 1.8 cm acute to early subacute ischemic infarct involving right thalamocapsular region, additional small acute to early subacute infarct in the inferior left cerebellum -CTA head and neck showed no emergent arterial finding, advanced intracranial atherosclerosis with multifocal moderate branch stenosis.  Atherosclerosis in the neck without flow reducing stenosis. -Follow 2D echo -PT OT, SLP evaluation - EEG showed mild diffuse encephalopathy, no seizure or epileptiform discharges. -Lipid panel showed cholesterol 260,  LDL 167 -Hemoglobin A1c 8.5 -Neurology consulted, dual antiplatelet therapy for now, aspirin PR only if fails swallow eval  Active Problems: Failure to thrive, cachexia -Patient has prior history of dementia and previous cryptogenic stroke, now worsened due to acute/subacute stroke -B12 2107, B1, TSH, free T4 pending -PT OT evaluation,  -Will place palliative medicine consult for GOC     History of seizure -Continue IV Keppra. -EEG showed mild diffuse encephalopathy, no seizure or epileptiform discharges.     Hyponatremia -Likely due to poor p.o. intake, -Sodium 128 on admission, placed on normal saline IV fluids, improved to 133    Insulin dependent type 2 diabetes mellitus (HCC) -Currently n.p.o., obtain hemoglobin A1c -Will place on sliding scale insulin once patient is tolerating diet.  Currently CBGs controlled  CBG (last 3)  Recent Labs    06/15/23 0315 06/15/23 0344 06/15/23 0749  GLUCAP 133* 120* 74      Essential hypertension -Continue permissive hypertension.  Hold antihypertensives -Patient is on chronic prednisone 5 mg daily, BP now soft 111/76 this a.m -place on Solu-Cortef IV until able to tolerate p.o. prednisone to avoid relative adrenal insufficiency    Hypothyroidism -Obtain TSH, free T4, continue IV Synthroid while awaiting SLP evaluation, currently n.p.o.    Dermatomyositis (HCC) -Outpatient on Plaquenil 200 mg daily,  prednisone 5 mg daily, methotrexate -Currently n.p.o., placed on IV Solu-Cortef 50 mg every 12 hours until patient able to tolerate p.o. prednisone    PVD (peripheral vascular disease) (HCC) -On aspirin, Plavix, Zetia outpatient -Currently n.p.o., resume once able to tolerate p.o.    Chronic ulcer of right great toe (HCC), POA -Wound care consult  Underweight Estimated body mass index is 15.92 kg/m as calculated from the following:   Height as of this encounter: 5\' 7"  (1.702 m).   Weight as of this encounter: 46.1 kg.  Code  Status: DNR DVT Prophylaxis:  enoxaparin (LOVENOX) injection 40 mg Start: 06/15/23 1000 SCDs Start: 06/14/23 2020   Level of Care: Level of care: Progressive Family Communication: Updated patient's daughter at the bedside Disposition Plan:      Remains inpatient appropriate: Workup in progress   Procedures:  EEG  Consultants:   Neurology Palliative medicine  Antimicrobials:   Anti-infectives (From admission, onward)    None          Medications  [START ON 06/16/2023] aspirin  300 mg Rectal Daily   enoxaparin (LOVENOX) injection  40 mg Subcutaneous Q24H   levothyroxine  75 mcg Intravenous Daily   sodium chloride flush  3 mL Intravenous Q12H   sodium chloride flush  3 mL Intravenous Q12H      Subjective:   [name] [name] was seen and examined today.  Alert and awake, daughter at the bedside.  Patient somewhat more responsive to daughter's verbal commands.  Currently no acute chest pain, shortness of breath, fevers, nausea vomiting.  Difficult to obtain review of system from the patient due to her mental status.  Objective:   Vitals:   06/15/23 0337 06/15/23 0345 06/15/23 0438 06/15/23 0750  BP:  (!) 157/61 (!) 165/62 (!) 147/70  Pulse:  (!) 55  61  Resp:  14  15  Temp: (!) 95.9 F (35.5 C)  (!) 97.3 F (36.3 C)   TempSrc: Rectal  Oral   SpO2:  100%  95%  Weight:   46.1 kg   Height:        Intake/Output Summary (Last 24 hours) at 06/15/2023 1039 Last data filed at 06/15/2023 0900 Gross per 24 hour  Intake 100 ml  Output 500 ml  Net -400 ml     Wt Readings from Last 3 Encounters:  06/15/23 46.1 kg  05/18/23 46.9 kg  05/01/23 54 kg     Exam General: Alert and awake, daughter at the bedside.  Ill-appearing. Cardiovascular: S1 S2 auscultated,  RRR Respiratory: Clear to auscultation bilaterally Gastrointestinal: Soft, nontender, nondistended, + bowel sounds Ext: no pedal edema bilaterally, diffuse muscle atrophy Neuro: difficult to assess,  patient does not follow commands, however was able to bend both of her legs spontaneously. Psych: dementia, confused    Data Reviewed:  I have personally reviewed following labs    CBC Lab Results  Component Value Date   WBC 10.4 06/15/2023   RBC 3.35 (L) 06/15/2023   HGB 11.0 (L) 06/15/2023   HCT 33.0 (L) 06/15/2023   MCV 98.5 06/15/2023   MCH 32.8 06/15/2023   PLT 241 06/15/2023   MCHC 33.3 06/15/2023   RDW 14.4 06/15/2023   LYMPHSABS 0.9 06/14/2023   MONOABS 0.5 06/14/2023   EOSABS 0.0 06/14/2023   BASOSABS 0.0 06/14/2023     Last metabolic panel Lab Results  Component Value Date   NA 133 (L) 06/15/2023   K 3.6 06/15/2023   CL 97 (L) 06/15/2023  CO2 29 06/17/23   BUN 14 June 17, 2023   CREATININE 0.74 2023-06-17   GLUCOSE 141 (H) Jun 17, 2023   GFRNONAA >60 Jun 17, 2023   GFRAA 54 (L) 10/12/2019   CALCIUM 9.7 06/17/2023   PHOS 2.9 02/15/2023   PROT 6.6 06/17/23   ALBUMIN 3.0 (L) 2023-06-17   BILITOT 0.7 Jun 17, 2023   ALKPHOS 109 17-Jun-2023   AST 24 06/17/23   ALT 34 17-Jun-2023   ANIONGAP 7 Jun 17, 2023    CBG (last 3)  Recent Labs    June 17, 2023 0315 06-17-23 0344 2023-06-17 0749  GLUCAP 133* 120* 74      Coagulation Profile: Recent Labs  Lab 06/14/23 1730  INR 1.1     Radiology Studies: I have personally reviewed the imaging studies  EEG adult  Result Date: 06/17/23 Charlsie Quest, MD     06/17/2023  9:43 AM Patient Name: [name] [name] MRN: [id number] Epilepsy Attending: Charlsie Quest Referring Physician/Provider: Tereasa Coop, MD Duration: 24.26 mins Patient history: 89yo F with ams getting eeg to evaluate for seizure Level of alertness: Awake, asleep AEDs during EEG study: LEV Technical aspects: This EEG study was done with scalp electrodes positioned according to the 10-20 International system of electrode placement. Electrical activity was reviewed with band pass filter of 1-70Hz , sensitivity of 7 uV/mm, display speed of 78mm/sec  with a 60Hz  notched filter applied as appropriate. EEG data were recorded continuously and digitally stored.  Video monitoring was available and reviewed as appropriate. Description: The posterior dominant rhythm consists of 8 Hz activity of moderate voltage (25-35 uV) seen predominantly in posterior head regions, symmetric and reactive to eye opening and eye closing. Sleep was characterized by vertex waves, sleep spindles (12 to 14 Hz), maximal frontocentral region. EEG showed intermittent generalized 3 to 6 Hz theta-delta slowing. Physiologic photic driving was seen during photic stimulation. Hyperventilation was not performed.   ABNORMALITY - Intermittent slow, generalized IMPRESSION: This study is suggestive of mild diffuse encephalopathy. No seizures or epileptiform discharges were seen throughout the recording. Priyanka Annabelle Harman   CT ANGIO HEAD NECK W WO CM  Result Date: 17-Jun-2023 CLINICAL DATA:  Stroke workup EXAM: CT ANGIOGRAPHY HEAD AND NECK WITH AND WITHOUT CONTRAST TECHNIQUE: Multidetector CT imaging of the head and neck was performed using the standard protocol during bolus administration of intravenous contrast. Multiplanar CT image reconstructions and MIPs were obtained to evaluate the vascular anatomy. Carotid stenosis measurements (when applicable) are obtained utilizing NASCET criteria, using the distal internal carotid diameter as the denominator. RADIATION DOSE REDUCTION: This exam was performed according to the departmental dose-optimization program which includes automated exposure control, adjustment of the mA and/or kV according to patient size and/or use of iterative reconstruction technique. CONTRAST:  75mL OMNIPAQUE IOHEXOL 350 MG/ML SOLN COMPARISON:  Brain MRI from yesterday FINDINGS: CT HEAD FINDINGS Brain: Known acute infarcts at the right thalamocapsular junction and inferior left cerebellum by MRI. No acute hemorrhage, hydrocephalus, mass, or collection. Vascular: No hyperdense  vessel or unexpected calcification. Skull: Normal. Negative for fracture or focal lesion. Sinuses/Orbits: No acute finding. Review of the MIP images confirms the above findings CTA NECK FINDINGS Aortic arch: Atheromatous calcification with 3 vessel branching. No acute finding or covered aneurysm Right carotid system: Mild for age atheromatous plaque mainly at the bifurcation without stenosis. No ulceration or dissection. Mild ICA beading is possible. Left carotid system: Atheromatous calcification at the bifurcation without stenosis or beading. No dissection. Vertebral arteries: No proximal subclavian stenosis. Atheromatous plaque at the right vertebral origin.  No vertebral beading, flow reducing stenosis, or dissection. Skeleton: Generalized degenerative endplate and facet spurring. No acute or aggressive finding Other neck: No acute finding Upper chest: No acute finding Review of the MIP images confirms the above findings CTA HEAD FINDINGS Anterior circulation: Atheromatous calcification of the cavernous carotids. No major branch occlusion, beading, or aneurysm. Atheromatous irregularity of the MCA branches with multifocal moderate M2 narrowing on both sides. Moderate right M1 origin stenosis, see coronal thick MIPS. Posterior circulation: The vertebral and basilar arteries are smoothly contoured and widely patent. Atheromatous irregularity of the posterior cerebral arteries with moderate stenosis at the right P2 and P3 levels. Venous sinuses: Unremarkable for arterial timing Anatomic variants: As above Review of the MIP images confirms the above findings IMPRESSION: 1. No emergent arterial finding. 2. Advanced intracranial atherosclerosis with multifocal moderate branch stenosis. 3. Atherosclerosis in the neck without flow reducing stenosis. 4. No intracranial hemorrhage. No visible progression of acute infarcts seen by MRI. Electronically Signed   By: Tiburcio Pea M.D.   On: 06/15/2023 06:51   MR BRAIN W WO  CONTRAST  Result Date: 06/15/2023 CLINICAL DATA:  Initial evaluation for acute altered mental status. EXAM: MRI HEAD WITHOUT AND WITH CONTRAST TECHNIQUE: Multiplanar, multiecho pulse sequences of the brain and surrounding structures were obtained without and with intravenous contrast. CONTRAST:  4.17mL GADAVIST GADOBUTROL 1 MMOL/ML IV SOLN COMPARISON:  CT from earlier the same day. FINDINGS: Brain: Examination degraded by motion artifact. Age-related cerebral atrophy. Patchy and confluent T2/FLAIR hyperintensity involving the periventricular deep white matter both cerebral hemispheres, consistent with chronic small vessel ischemic disease, moderately advanced in nature. Small remote left cerebellar infarct noted. 1.8 cm focus of restricted diffusion involving the ventral right thalamocapsular region, consistent with an evolving acute to early subacute ischemic infarct (series 5, image 23). Additional small acute to early subacute infarct involving the inferior left cerebellum (series 5, image 7). No associated hemorrhage or significant mass effect. No other diffusion signal abnormality identified. However, there are a few small nodular foci of enhancement involving the bilateral thalami (series 16, images 72, 69). While these findings are nonspecific, changes are suspected to reflect enhancement related to evolving subacute ischemic changes. Gray-white matter differentiation otherwise maintained. No acute intracranial hemorrhage. No significant chronic intracranial blood products visible on this motion degraded exam. No mass lesion, midline shift, or mass effect. Ventricular prominence related global parenchymal volume loss without hydrocephalus. No extra-axial fluid collection. Pituitary gland suprasellar region within normal limits. No other abnormal enhancement. Vascular: Major intracranial vascular flow voids are maintained. Skull and upper cervical spine: Degenerative thickening noted about the tectorial  membrane. Craniocervical junction otherwise unremarkable. Bone marrow signal intensity within normal limits. No scalp soft tissue abnormality. Sinuses/Orbits: Prior bilateral ocular lens replacement. Paranasal sinuses are largely clear. Small bilateral mastoid effusions, of doubtful significance. Visualized nasopharynx unremarkable. Other: None. IMPRESSION: 1. 1.8 cm acute to early subacute ischemic nonhemorrhagic infarct involving the ventral right thalamocapsular region. 2. Additional small acute to early subacute infarct involving the inferior left cerebellum. 3. Few small nodular foci of enhancement involving the bilateral thalami. While these findings are nonspecific, changes are suspected to reflect enhancement related to evolving subacute ischemic changes. Short interval follow-up MRI to ensure these changes resolve recommended. 4. Underlying age-related cerebral atrophy with moderate chronic microvascular ischemic disease. Electronically Signed   By: Rise Mu M.D.   On: 06/15/2023 01:34   CT HEAD WO CONTRAST  Result Date: 06/14/2023 CLINICAL DATA:  Altered mental status EXAM: CT  HEAD WITHOUT CONTRAST TECHNIQUE: Contiguous axial images were obtained from the base of the skull through the vertex without intravenous contrast. RADIATION DOSE REDUCTION: This exam was performed according to the departmental dose-optimization program which includes automated exposure control, adjustment of the mA and/or kV according to patient size and/or use of iterative reconstruction technique. COMPARISON:  05/18/2023 FINDINGS: Brain: There is atrophy and chronic small vessel disease changes. No acute intracranial abnormality. Specifically, no hemorrhage, hydrocephalus, mass lesion, acute infarction, or significant intracranial injury. Vascular: No hyperdense vessel or unexpected calcification. Skull: No acute calvarial abnormality. Sinuses/Orbits: No acute findings Other: None IMPRESSION: Atrophy, chronic  microvascular disease. No acute intracranial abnormality. Electronically Signed   By: Charlett Nose M.D.   On: 06/14/2023 17:58   DG Chest Portable 1 View  Result Date: 06/14/2023 CLINICAL DATA:  Loss of consciousness.  Altered mental status. EXAM: PORTABLE CHEST 1 VIEW COMPARISON:  AP chest 05/21/2023, 03/16/2023, 10/14/2022 FINDINGS: Cardiac silhouette and mediastinal contours are unchanged and within normal limits. Moderate calcification within the aortic arch. Left chest wall cardiac loop recorder is again noted. Improved aeration of the right lung. No definite acute lung opacity. The current lung appearance is similar to baseline 10/14/2022 radiographs. No pleural effusion pneumothorax. No acute skeletal abnormality. IMPRESSION: Resolution of the subtle right lung airspace opacities seen on most recent 05/21/2023 radiographs. No definite acute lung opacity. Electronically Signed   By: Neita Garnet M.D.   On: 06/14/2023 17:28       Javel Hersh M.D. [facility] 06/15/2023, 10:39 AM  Available via Epic secure chat 7am-7pm After 7 pm, please refer to night coverage provider listed on amion.

## 2023-06-15 NOTE — Consult Note (Signed)
[facility] Tele[facility]urology Consult Services  Stat Consult  Patient Name:   [name] [name], [name] [name] Date of Birth:   1934/03/21 Identification Number:   MRN - [id number] Date of Service:   06/15/2023 01:51:52  Diagnosis:       I63.49 - Cerebrovascular accident (CVA) due to embolism of other cerebral artery (HCCC)  Impression 89 year old female with multiple vascular risk factors presenting in consult for stroke and lethargy. Patient has multiple infarcts in multiple vascular distributions. This is suggestive of cardioembolism. Recommend completion of CVA evaluation and monitor for clinical improvement. Use aspirin per rectum if patient unable to tolerate oral medications.   Recommendations: Our recommendations are outlined below.  Diagnostic Studies : CTA head and neck with contrast  Laboratory Studies : Lipid panel I ordered Hemoglobin A1c TSH  Antithrombotic Medication : Aspirin 300 mg PR daily  Anticoagulant Medication : Not indicated  Nursing Recommendations : IV Fluids, avoid dextrose containing fluids, Maintain euglycemia Neuro checks q4 hrs x 24 hrs and then per shift Head of bed 30 degrees Continue with Telemetry  Consultations : Recommend Speech therapy if failed dysphagia screen Physical therapy/Occupational therapy Inpatient rehab if recommended by physical/occupational therapy  DVT Prophylaxis : Choice of Primary Team  Disposition : Neurology will follow    ----------------------------------------------------------------------------------------------------    Metrics: Dispatch Time: 06/15/2023 01:51:52 Callback Response Time: 06/15/2023 01:52:06  Primary Provider Notified of Diagnostic Impression and Management Plan on: 06/15/2023 02:05:55     ----------------------------------------------------------------------------------------------------  Chief Complaint: Stroke  History of Present Illness: Patient is a 89 year old  Female. 89 year old female history of hypertension, hyperlipidemia, diabetes presenting consult for stroke. The patient has been encephalopathic and lethargic for 7 days. Family brought her to ED because symptoms did not abate. Imaging was obtained demonstrated subacute infarcts in the right thalamus and left cerebellum. At the time of neurology evaluation, the patient is lethargic and not able to participate in examination.    Past Medical History:      Hypertension      Diabetes Mellitus      Hyperlipidemia  Medications:  No Anticoagulant use  Antiplatelet use: Yes Aspirin and clopidogrel Reviewed EMR for current medications  Allergies:  Reviewed Description: NSAID  Social History: Smoking: No  Family History:  There is no family history of premature cerebrovascular disease pertinent to this consultation  ROS : ROS Cannot Be Obtained Because:  Patient Is Confused  Past Surgical History: There Is No Surgical History Contributory To Today's Visit    Examination: BP(159/56), Pulse(51),  Spoke with : Dr. Janalyn Shy  Coma Exam:  Responds to Voice:  No  Responds to Sternal rub:  Yes  Withdraws limb from painful stimulation:  Yes  Spontaneous limb movement:  Right arm: No Left arm: No Right leg: No Left leg: No  Pupils:  Equal and Reactive    This consult was conducted in real time using interactive audio and Immunologist. Patient was informed of the technology being used for this visit and agreed to proceed. Patient located in hospital and provider located at home/office setting.  Patient is being evaluated for possible acute neurologic impairment and high probability of imminent or life - threatening deterioration.I spent total of 15 minutes providing care to this patient, including time for face to face visit via telemedicine, review of medical records, imaging studies and discussion of findings with providers, the patient and / or family.   Dr Vernice Jefferson   [facility] For Inpatient follow-up with [facility] physician please call [facility] at  [phone]. As we are not an outpatient service for any post hospital discharge needs please contact the hospital for assistance.  If you have any questions for the [facility] physicians or need to reconsult for clinical or diagnostic changes please contact us via [facility] at [phone].

## 2023-06-15 NOTE — Progress Notes (Signed)
Pt off unit transported to CT scan

## 2023-06-16 DIAGNOSIS — E871 Hypo-osmolality and hyponatremia: Secondary | ICD-10-CM | POA: Diagnosis not present

## 2023-06-16 DIAGNOSIS — I639 Cerebral infarction, unspecified: Secondary | ICD-10-CM

## 2023-06-16 DIAGNOSIS — Z515 Encounter for palliative care: Secondary | ICD-10-CM | POA: Diagnosis not present

## 2023-06-16 DIAGNOSIS — Z7189 Other specified counseling: Secondary | ICD-10-CM | POA: Diagnosis not present

## 2023-06-16 DIAGNOSIS — G9341 Metabolic encephalopathy: Secondary | ICD-10-CM | POA: Diagnosis not present

## 2023-06-16 LAB — COMPREHENSIVE METABOLIC PANEL
ALT: 28 U/L (ref 0–44)
AST: 21 U/L (ref 15–41)
Albumin: 2.7 g/dL — ABNORMAL LOW (ref 3.5–5.0)
Alkaline Phosphatase: 101 U/L (ref 38–126)
Anion gap: 11 (ref 5–15)
BUN: 15 mg/dL (ref 8–23)
CO2: 23 mmol/L (ref 22–32)
Calcium: 9.5 mg/dL (ref 8.9–10.3)
Chloride: 98 mmol/L (ref 98–111)
Creatinine, Ser: 0.81 mg/dL (ref 0.44–1.00)
GFR, Estimated: >60 mL/min (ref >60)
Glucose, Bld: 276 mg/dL — ABNORMAL HIGH (ref 70–99)
Potassium: 3.7 mmol/L (ref 3.5–5.1)
Sodium: 132 mmol/L — ABNORMAL LOW (ref 135–145)
Total Bilirubin: 0.6 mg/dL (ref <1.2)
Total Protein: 5.8 g/dL — ABNORMAL LOW (ref 6.5–8.1)

## 2023-06-16 LAB — CBC
HCT: 25.3 % — ABNORMAL LOW (ref 36.0–46.0)
Hemoglobin: 8.5 g/dL — ABNORMAL LOW (ref 12.0–15.0)
MCH: 32.2 pg (ref 26.0–34.0)
MCHC: 33.6 g/dL (ref 30.0–36.0)
MCV: 95.8 fL (ref 80.0–100.0)
Platelets: 324 10*3/uL (ref 150–400)
RBC: 2.64 MIL/uL — ABNORMAL LOW (ref 3.87–5.11)
RDW: 14 % (ref 11.5–15.5)
WBC: 7.5 10*3/uL (ref 4.0–10.5)
nRBC: 0 % (ref 0.0–0.2)

## 2023-06-16 LAB — SODIUM: Sodium: 129 mmol/L — ABNORMAL LOW (ref 135–145)

## 2023-06-16 LAB — GLUCOSE, CAPILLARY
Glucose-Capillary: 216 mg/dL — ABNORMAL HIGH (ref 70–99)
Glucose-Capillary: 435 mg/dL — ABNORMAL HIGH (ref 70–99)
Glucose-Capillary: 406 mg/dL — ABNORMAL HIGH (ref 70–99)
Glucose-Capillary: 237 mg/dL — ABNORMAL HIGH (ref 70–99)
Glucose-Capillary: 164 mg/dL — ABNORMAL HIGH (ref 70–99)
Glucose-Capillary: 77 mg/dL (ref 70–99)

## 2023-06-16 LAB — GLUCOSE, RANDOM: Glucose, Bld: 462 mg/dL — ABNORMAL HIGH (ref 70–99)

## 2023-06-16 MED ORDER — LEVOTHYROXINE SODIUM 100 MCG PO TABS
100.0000 ug | ORAL_TABLET | Freq: Every day | ORAL | Status: DC
  Administered 2023-06-17 – 2023-06-21 (×5): 100 ug via ORAL
  Filled 2023-06-16 (×5): qty 1

## 2023-06-16 MED ORDER — INSULIN GLARGINE-YFGN 100 UNIT/ML ~~LOC~~ SOLN
5.0000 [IU] | Freq: Every day | SUBCUTANEOUS | Status: DC
  Administered 2023-06-16: 5 [IU] via SUBCUTANEOUS
  Filled 2023-06-16 (×2): qty 0.05

## 2023-06-16 MED ORDER — PREDNISONE 5 MG PO TABS
5.0000 mg | ORAL_TABLET | Freq: Every day | ORAL | Status: DC
  Administered 2023-06-16 – 2023-06-21 (×6): 5 mg via ORAL
  Filled 2023-06-16 (×6): qty 1

## 2023-06-16 MED ORDER — LEVETIRACETAM 500 MG PO TABS
500.0000 mg | ORAL_TABLET | Freq: Two times a day (BID) | ORAL | Status: DC
  Administered 2023-06-16 – 2023-06-21 (×10): 500 mg via ORAL
  Filled 2023-06-16 (×10): qty 1

## 2023-06-16 MED ORDER — INSULIN ASPART 100 UNIT/ML IJ SOLN
12.0000 [IU] | Freq: Once | INTRAMUSCULAR | Status: AC
  Administered 2023-06-16: 12 [IU] via SUBCUTANEOUS

## 2023-06-16 MED ORDER — INSULIN ASPART 100 UNIT/ML IJ SOLN
12.0000 [IU] | Freq: Once | INTRAMUSCULAR | Status: AC
Start: 2023-06-16 — End: 2023-06-16
  Administered 2023-06-16: 12 [IU] via SUBCUTANEOUS

## 2023-06-16 NOTE — Progress Notes (Signed)
SLP Cancellation Note  Patient Details Name: [name] [name] MRN: [id number] DOB: 25-Jul-1933   Cancelled treatment:       Reason Eval/Treat Not Completed: Fatigue/lethargy limiting ability to participate- Palliative meeting pending.  SLP will follow. Continue dysphagia 1 diet with thin liquids, careful hand-feeding per family.  Mariangel Ringley L. Samson Frederic, MA CCC/SLP Clinical Specialist - Acute Care SLP Acute Rehabilitation Services Office number [phone]    Blenda Mounts Laurice 06/16/2023, 1:18 PM

## 2023-06-16 NOTE — Progress Notes (Signed)
11/27 I spoke to the patient's family via telephone to explain the IMM Letter and daughterAyesha Mohair) gave verbal consent and acknowledgement of letter.

## 2023-06-16 NOTE — Care Management Important Message (Signed)
Important Message  Patient Details  Name: [name] [name] MRN: [id number] Date of Birth: 10-26-1933   Important Message Given:  Yes - Medicare IM     Sherilyn Banker 06/16/2023, 1:30 PM

## 2023-06-16 NOTE — Inpatient Diabetes Management (Signed)
Inpatient Diabetes Program Recommendations  AACE/ADA: New Consensus Statement on Inpatient Glycemic Control (2015)  Target Ranges:  Prepandial:   less than 140 mg/dL      Peak postprandial:   less than 180 mg/dL (1-2 hours)      Critically ill patients:  140 - 180 mg/dL   Lab Results  Component Value Date   GLUCAP 406 (H) 06/16/2023   HGBA1C 8.5 (H) 06/15/2023    Review of Glycemic Control  Latest Reference Range & Units 06/16/23 07:35 06/16/23 11:11  Glucose-Capillary 70 - 99 mg/dL 161 (H) 096 (H)  (H): Data is abnormally high  Diabetes history: DM2 Outpatient Diabetes medications: Levemir 7 units QAM, 3 units at bedtime, Novolog 2-10 units TID Current orders for Inpatient glycemic control: Semglee 5 units every day, Novolog 0-9 units TID and 0-5 units at bedtime, Prednisone 5 mg QD  Inpatient Diabetes Program Recommendations:    Noted order for Novolog 12 units x 1 for hyperglycemia.  Please consider:  While receiving steroids-  Novolog 0-15 units TID and 0-5 units at bedtime Novolog 2 units TID with meals Semglee 5 units BID  Will continue to follow while inpatient.  Thank you, Dulce Sellar, MSN, CDCES Diabetes Coordinator Inpatient Diabetes Program [phone] (team pager from 8a-5p)

## 2023-06-16 NOTE — Progress Notes (Addendum)
   06/16/23 0750  Provider Notification  Provider Name/Title Geannie Risen MD  Date Provider Notified 06/16/23  Time Provider Notified [phone]  Method of Notification Page  Notification Reason Critical Result  Test performed and critical result POCT CBG 435  Date Critical Result Received 06/16/23  Time Critical Result Received 0735   This RN received critical POCT CBG 435 from nurse tech this AM. Ordered STAT glucose random order based on standing protocol. [name] MD paged. Waiting on response; no new orders at this time.   Update 0754: New orders from [facility] MD to D/C IV hydrocortisone and start on PO prednisone. 12 units of insulin aspart to be admin.  Update 1116: CBG 406. [name] MD paged. 12 units of insulin aspart to be admin. Diabetes coordinator following while inpatient.

## 2023-06-16 NOTE — Progress Notes (Signed)
Daily Progress Note   Patient Name: [name] [name]       Date: 06/16/2023 DOB: 06-05-1934  Age: 89 y.o. MRN#: [id number] Attending Physician: Glade Lloyd, MD Primary Care Physician: [name], MD Admit Date: 06/14/2023  Reason for Consultation/Follow-up: Establishing goals of care  Subjective: Medical records reviewed including progress notes, labs, and imaging. Patient assessed at the bedside. No signs of pain or distress. Discussed with RN and SLP. Patient has been lethargic throughout the day. She did not arouse to my attempts at conversation. No family was present during my visit.  Called patient's daughter [name] [name] to provide palliative support. She is understandably concerned with patient's sleepiness today and low appetite/poor intake. We reviewed her conversation with patient's primary attending and uncertainty of how this acute stroke will impact her overall disease trajectory. She is a bit disappointed and feels patient may be written off due to her age.   I shared that I was unfortunately unable to discuss patient's mood or quality of life with her due to her low energy levels today. We discussed appropriateness of continuing to encourage oral intake and meeting tomorrow morning for further goals of care discussions.  Questions and concerns addressed. PMT will continue to support holistically.   Length of Stay: 2   Physical Exam Vitals and nursing note reviewed.  Constitutional:      General: She is not in acute distress.    Appearance: She is ill-appearing.  Cardiovascular:     Rate and Rhythm: Normal rate.  Pulmonary:     Effort: Pulmonary effort is normal. No respiratory distress.  Neurological:     Mental Status: She is lethargic.  Psychiatric:        Cognition and  Memory: Cognition is impaired.            Vital Signs: BP (!) 145/64 (BP Location: Right Arm)   Pulse 79   Temp 97.6 F (36.4 C) (Oral)   Resp 15   Ht 5\' 7"  (1.702 m)   Wt 45.9 kg   SpO2 98%   BMI 15.85 kg/m  SpO2: SpO2: 98 % O2 Device: O2 Device: Room Air O2 Flow Rate:        Palliative Assessment/Data: 20%   Palliative Care Assessment & Plan   Patient Profile: 89 y.o. female  with past medical history of dementia, insulin-dependent DM type II, dermatomyositis (on Plaquenil and chronic prednisone 5 mg daily), peripheral vascular disease, hypertension, orthostatic hypotension, hypothyroidism, chronic wound of the bilateral toe, recurrent syncope and vasovagal syncope, seizure, hypothyroidism, and macrocytic anemia  admitted on 06/14/2023 with altered mental status.    Patient is admitted for acute metabolic encephalopathy due to acute CVA (MRI showed 1.8 cm acute to early subacute ischemic infarct involving right thalamocapsular region, additional small acute to early subacute infarct in the inferior left cerebellum.)   Of note, patient has had 7 admissions in the past 6 months. She is followed by outpatient palliative care after a brief hospice evaluation in January 2024. PMT has been consulted to assist with goals of care conversation.  Assessment: Goals of care conversation Acute/subacute infarct in the right thalamocapsular region small/subacute infarct in the inferior left cerebellum  Acute  metabolic encephalopathy  Failure to thrive  Recommendations/Plan: Continue DNR/DNI Continue supportive care Ongoing goals of care discussions pending clinical course.  Plan to meet with patient's family tomorrow morning. Psychosocial and emotional support provided PMT will continue to follow and support   Prognosis: Guarded  Discharge Planning: To Be Determined  Care plan was discussed with Patient's daughter, RN, SLP   Total time: I spent 35 minutes in the care of the  patient today in the above activities and documenting the encounter.          William Laske Jeni Salles, PA-C  Palliative Medicine Team Team phone # [phone]  Thank you for allowing the Palliative Medicine Team to assist in the care of this patient. Please utilize secure chat with additional questions, if there is no response within 30 minutes please call the above phone number.  Palliative Medicine Team providers are available by phone from 7am to 7pm daily and can be reached through the team cell phone.  Should this patient require assistance outside of these hours, please call the patient's attending physician.

## 2023-06-16 NOTE — Progress Notes (Signed)
   06/16/23 1242  TOC Brief Assessment  Insurance and Status Reviewed  Patient has primary care physician Yes  Home environment has been reviewed home with daughters  Prior level of function: active with HH, and assisted by family in the home  Prior/Current Home Services Current home services (Active with [facility] for HHRN/PT)  Social Determinants of Health Reivew SDOH reviewed no interventions necessary  Readmission risk has been reviewed Yes  Transition of care needs transition of care needs identified, TOC will continue to follow     Noted PC mtg planned,  We will continue to monitor patient advancement through interdisciplinary progression rounds. If new patient transition needs arise, please place a TOC consult.

## 2023-06-16 NOTE — Progress Notes (Addendum)
PROGRESS NOTE    [name] [name]  ZOX:[id number] DOB: [birth year] DOA: 06/14/2023 PCP: [name], MD   Brief Narrative:  89 y.o. female with medical history significant for dementia, insulin-dependent T2DM, dermatomyositis (on Plaquenil and chronic prednisone 5 mg daily), PAD, history of syncope due to orthostasis and vasovagal episodes, history of seizure, hypothyroidism, macrocytic anemia, recent hospitalization in October 2024 for acute encephalopathy secondary to Klebsiella UTI treated with IV antibiotics followed by an admission from 05/18/2023-05/24/2023 for multifocal aspiration pneumonia treated with IV antibiotics along with acute metabolic encephalopathy presented with increased lethargy.  Workup revealed acute to early subacute ischemic infarct involving right thalamocapsular region, additional small acute to early subacute infarct in the inferior left cerebellum.  Neurology was consulted.  Assessment & Plan:   Acute/subacute infarct in the right thalamocapsular region, small/subacute infarct in the inferior left cerebellum Acute metabolic encephalopathy History of dementia Hyperlipidemia Dysphagia -Possibly due to advanced intracranial atherosclerosis -CTA head and neck showed advanced intracranial atherosclerosis with multifocal moderate pan stenosis. -2D echo showed EF of 60 to 60%, negative bubble study.  Lower extremity duplex did not show any DVT. -EEG showed mild diffuse encephalopathy.  No seizures -LDL 167.  A1c 8.5.  Continue ezetimibe.  Patient intolerant of statins per daughters -Neurology recommended aspirin and Plavix for 3 months then aspirin alone -Patient is mostly bedbound hence PT did not evaluate the patient -Diet as per ST recommendations  Failure to thrive Poor oral intake Severe malnutrition -Oral intake remains poor. -Palliative care consulted for goals of care discussion.  Follow recommendations -Consult dietitian  Leukocytosis -Possible  reactive.  Resolved  Anemia of chronic disease -From chronic illnesses per hemoglobin stable.  Monitor intermittently  Hyponatremia -Possibly from poor oral intake.  Sodium 139 today.  Monitor  Diabetes mellitus type 2 with hyperglycemia -Start low-dose long-acting insulin.  CBGs with SSI  Dermatomyositis chronic debility with bed/wheelchair bound -DC hydrocortisone.  Resume home prednisone.  Resume Plaquenil and methotrexate on discharge.  Outpatient follow-up with rheumatology  History of seizures -Continue Keppra.  Outpatient follow-up with neurology  Hypertension -Monitor blood pressure.  Not on any antihypertensives at this time  Peripheral artery disease -Status post angioplasty of bilateral common iliac artery stenosis in 01/2023.  Continue aspirin, Plavix, Zetia.   Stage II mid sacrum pressure injury: Present on admission -Continue local wound care  DVT prophylaxis: Lovenox Code Status: DNR Family Communication: Daughter at bedside.  Another daughter on phone Disposition Plan: Status is: Inpatient Remains inpatient appropriate because: Of severity of illness.  Oral intake still extremely poor  Consultants: Neurology/palliative care  Procedures: As above  Antimicrobials: None  Subjective: Patient seen and examined at bedside.  Awake, does not participate in conversation much.  Daughter at bedside reports that oral intake remains poor.  No fever, seizures, vomiting reported  Objective: Vitals:   06/16/23 0321 06/16/23 0342 06/16/23 0400 06/16/23 0738  BP: (!) 158/67  (!) 153/67 (!) 150/55  Pulse: 81  78 83  Resp: 11  14 17   Temp: 97.9 F (36.6 C)  97.9 F (36.6 C) (!) 97.5 F (36.4 C)  TempSrc: Axillary  Axillary Oral  SpO2: 98%  97% 98%  Weight:  45.9 kg    Height:        Intake/Output Summary (Last 24 hours) at 06/16/2023 1052 Last data filed at 06/16/2023 0327 Gross per 24 hour  Intake 386.6 ml  Output 650 ml  Net -263.4 ml   Filed Weights    06/14/23 1647  06/15/23 0438 06/16/23 0342  Weight: 45.5 kg 46.1 kg 45.9 kg    Examination:  General exam: Appears calm and comfortable.  Looks chronically ill and deconditioned.  On room air.  Elderly female lying in bed. Respiratory system: Bilateral decreased breath sounds at bases with scattered crackles Cardiovascular system: S1 & S2 heard, Rate controlled Gastrointestinal system: Abdomen is nondistended, soft and nontender. Normal bowel sounds heard. Extremities: No cyanosis, clubbing, edema  Central nervous system:.  Wakes up slightly, hardly participates in any conversation.  Skin: No ecchymosis/lesions  psychiatry: Extremely flat.  Not agitated.    Data Reviewed: I have personally reviewed following labs and imaging studies  CBC: Recent Labs  Lab 06/14/23 1730 06/14/23 1750 06/15/23 0330 06/16/23 0535  WBC 18.6*  --  10.4 7.5  NEUTROABS 17.0*  --   --   --   HGB 9.2* 9.5* 11.0* 8.5*  HCT 27.9* 28.0* 33.0* 25.3*  MCV 98.9  --  98.5 95.8  PLT 309  --  241 324   Basic Metabolic Panel: Recent Labs  Lab 06/14/23 1730 06/14/23 1750 06/15/23 0330 06/15/23 0607 06/15/23 1318 06/15/23 1827 06/15/23 2216 06/16/23 0535 06/16/23 0757  NA 128* 131* 133*   < > 135 133* 128* 132* 129*  K 4.0 4.1 3.6  --   --   --   --  3.7  --   CL 92* 91* 97*  --   --   --   --  98  --   CO2 29  --  29  --   --   --   --  23  --   GLUCOSE 210* 218* 141*  --   --   --   --  276* 462*  BUN 16 15 14   --   --   --   --  15  --   CREATININE 0.83 0.90 0.74  --   --   --   --  0.81  --   CALCIUM 9.8  --  9.7  --   --   --   --  9.5  --    < > = values in this interval not displayed.   GFR: Estimated Creatinine Clearance: 35.5 mL/min (by C-G formula based on SCr of 0.81 mg/dL). Liver Function Tests: Recent Labs  Lab 06/14/23 1730 06/15/23 0330 06/16/23 0535  AST 26 24 21   ALT 39 34 28  ALKPHOS 125 109 101  BILITOT 0.4 0.7 0.6  PROT 6.9 6.6 5.8*  ALBUMIN 3.2* 3.0* 2.7*   No  results for input(s): "LIPASE", "AMYLASE" in the last 168 hours. Recent Labs  Lab 06/14/23 1730  AMMONIA 20   Coagulation Profile: Recent Labs  Lab 06/14/23 1730  INR 1.1   Cardiac Enzymes: No results for input(s): "CKTOTAL", "CKMB", "CKMBINDEX", "TROPONINI" in the last 168 hours. BNP (last 3 results) No results for input(s): "PROBNP" in the last 8760 hours. HbA1C: Recent Labs    06/15/23 0330  HGBA1C 8.5*   CBG: Recent Labs  Lab 06/15/23 1715 06/15/23 1923 06/15/23 2211 06/16/23 0317 06/16/23 0735  GLUCAP 231* 243* 233* 216* 435*   Lipid Profile: Recent Labs    06/15/23 0330  CHOL 260*  HDL 83  LDLCALC 167*  TRIG 52  CHOLHDL 3.1   Thyroid Function Tests: No results for input(s): "TSH", "T4TOTAL", "FREET4", "T3FREE", "THYROIDAB" in the last 72 hours. Anemia Panel: Recent Labs    06/15/23 0607  VITAMINB12 2,107*   Sepsis Labs: No  results for input(s): "PROCALCITON", "LATICACIDVEN" in the last 168 hours.  Recent Results (from the past 240 hour(s))  Blood culture (routine x 2)     Status: None (Preliminary result)   Collection Time: 06/14/23  8:20 PM   Specimen: BLOOD  Result Value Ref Range Status   Specimen Description   Final    BLOOD LEFT ANTECUBITAL Performed at [facility], [address] [place], [place] [address]    Special Requests   Final    BOTTLES DRAWN AEROBIC AND ANAEROBIC Blood Culture adequate volume Performed at [facility], [address] [place], [place] [address]    Culture   Final    NO GROWTH 2 DAYS Performed at [facility], [address], [place], [place] [address]    Report Status PENDING  Incomplete  Culture, blood (Routine X 2) w Reflex to ID Panel     Status: None (Preliminary result)   Collection Time: 06/15/23  3:30 AM   Specimen: BLOOD  Result Value Ref Range Status   Specimen Description   Final    BLOOD BLOOD RIGHT FOREARM Performed at [facility], [address], [place], [place] [address]    Special Requests   Final    BOTTLES DRAWN AEROBIC AND ANAEROBIC Blood Culture adequate volume Performed at [facility], [address] [place], [place] [address]    Culture   Final    NO GROWTH 1 DAY Performed at [facility], [address], [place], [place] [address]    Report Status PENDING  Incomplete  Culture, blood (Routine X 2) w Reflex to ID Panel     Status: None (Preliminary result)   Collection Time: 06/15/23  6:07 AM   Specimen: BLOOD  Result Value Ref Range Status   Specimen Description BLOOD BLOOD RIGHT HAND  Final   Special Requests   Final    BOTTLES DRAWN AEROBIC AND ANAEROBIC Blood Culture adequate volume   Culture   Final    NO GROWTH 1 DAY Performed at [facility], [address], [place], [place] [address]    Report Status PENDING  Incomplete         Radiology Studies: VAS [place] LOWER EXTREMITY VENOUS (DVT)  Result Date: 06/15/2023  Lower Venous DVT Study Patient Name:  AVON LASYONE  Date of Exam:   06/15/2023 Medical Rec #: [id number]        Accession #:    [id number] Date of Birth: 1934/04/22        Patient Gender: F Patient Age:   89 years Exam Location:  [facility] Procedure:      VAS [place] LOWER EXTREMITY VENOUS (DVT) Referring Phys: Scheryl Marten XU --------------------------------------------------------------------------------  Indications: Stroke, and Embolic stroke. Other Indications: Anemia. Comparison Study: No prior exam. Performing Technologist: Fernande Bras  Examination Guidelines: A complete evaluation includes B-mode imaging, spectral Doppler, color Doppler, and power Doppler as needed of all accessible portions of each vessel. Bilateral testing is considered an integral part of a complete examination. Limited examinations for reoccurring indications may be performed as noted. The reflux portion of the exam is performed with the patient in reverse  Trendelenburg.  +---------+---------------+---------+-----------+----------+--------------+ RIGHT    CompressibilityPhasicitySpontaneityPropertiesThrombus Aging +---------+---------------+---------+-----------+----------+--------------+ CFV      Full           Yes      Yes                                 +---------+---------------+---------+-----------+----------+--------------+  SFJ      Full                                                        +---------+---------------+---------+-----------+----------+--------------+ FV Prox  Full                                                        +---------+---------------+---------+-----------+----------+--------------+ FV Mid   Full                                                        +---------+---------------+---------+-----------+----------+--------------+ FV DistalFull                                                        +---------+---------------+---------+-----------+----------+--------------+ PFV      Full                                                        +---------+---------------+---------+-----------+----------+--------------+ POP      Full           Yes      Yes                                 +---------+---------------+---------+-----------+----------+--------------+ PTV      Full                                                        +---------+---------------+---------+-----------+----------+--------------+ PERO     Full                                                        +---------+---------------+---------+-----------+----------+--------------+ Distal FV limited due to calcific shadowing in atery.  +---------+---------------+---------+-----------+----------+--------------+ LEFT     CompressibilityPhasicitySpontaneityPropertiesThrombus Aging +---------+---------------+---------+-----------+----------+--------------+ CFV      Full           Yes      Yes                                  +---------+---------------+---------+-----------+----------+--------------+ SFJ      Full                                                        +---------+---------------+---------+-----------+----------+--------------+  FV Prox  Full                                                        +---------+---------------+---------+-----------+----------+--------------+ FV Mid   Full                                                        +---------+---------------+---------+-----------+----------+--------------+ FV DistalFull                                                        +---------+---------------+---------+-----------+----------+--------------+ PFV      Full                                                        +---------+---------------+---------+-----------+----------+--------------+ POP      Full           Yes      Yes                                 +---------+---------------+---------+-----------+----------+--------------+ PTV      Full                                                        +---------+---------------+---------+-----------+----------+--------------+ PERO     Full                                                        +---------+---------------+---------+-----------+----------+--------------+ Distal FV limited due to calcific shadowing in atery.    Summary: BILATERAL: - No evidence of deep vein thrombosis seen in the lower extremities, bilaterally. -No evidence of popliteal cyst, bilaterally.   *See table(s) above for measurements and observations.    Preliminary    ECHOCARDIOGRAM COMPLETE  Result Date: 06/15/2023    ECHOCARDIOGRAM REPORT   Patient Name:   BRYONNA MOWDY Date of Exam: 06/15/2023 Medical Rec #:  [id number]       Height:       67.0 in Accession #:    [id number]      Weight:       101.6 lb Date of Birth:  Feb 01, 1934       BSA:          1.517 m Patient Age:    89 years        BP:           153/59  mmHg Patient Gender: F  HR:           59 bpm. Exam Location:  Inpatient Procedure: 2D Echo, Cardiac Doppler and Color Doppler Indications:    CVA  History:        Patient has prior history of Echocardiogram examinations.  Sonographer:    Milda Smart Referring Phys: [id number] SUBRINA SUNDIL IMPRESSIONS  1. Left ventricular ejection fraction, by estimation, is 60 to 65%. The left ventricle has normal function. The left ventricle has no regional wall motion abnormalities. There is mild concentric left ventricular hypertrophy. Left ventricular diastolic parameters are consistent with Grade I diastolic dysfunction (impaired relaxation).  2. Right ventricular systolic function is normal. The right ventricular size is normal. There is normal pulmonary artery systolic pressure. The estimated right ventricular systolic pressure is 30.9 mmHg.  3. The mitral valve is degenerative. Mild mitral valve regurgitation. No evidence of mitral stenosis.  4. The aortic valve is tricuspid. There is moderate calcification of the aortic valve. Aortic valve regurgitation is not visualized. No aortic stenosis is present.  5. The inferior vena cava is normal in size with greater than 50% respiratory variability, suggesting right atrial pressure of 3 mmHg.  6. Negative bubble study, no evidence for ASD or PFO. FINDINGS  Left Ventricle: Left ventricular ejection fraction, by estimation, is 60 to 65%. The left ventricle has normal function. The left ventricle has no regional wall motion abnormalities. The left ventricular internal cavity size was normal in size. There is  mild concentric left ventricular hypertrophy. Left ventricular diastolic parameters are consistent with Grade I diastolic dysfunction (impaired relaxation). Right Ventricle: The right ventricular size is normal. No increase in right ventricular wall thickness. Right ventricular systolic function is normal. There is normal pulmonary artery systolic pressure. The  tricuspid regurgitant velocity is 2.64 m/s, and  with an assumed right atrial pressure of 3 mmHg, the estimated right ventricular systolic pressure is 30.9 mmHg. Left Atrium: Left atrial size was normal in size. Right Atrium: Right atrial size was normal in size. Pericardium: There is no evidence of pericardial effusion. Mitral Valve: The mitral valve is degenerative in appearance. There is mild calcification of the mitral valve leaflet(s). Mild to moderate mitral annular calcification. Mild mitral valve regurgitation. No evidence of mitral valve stenosis. MV peak gradient, 4.2 mmHg. The mean mitral valve gradient is 2.0 mmHg. Tricuspid Valve: The tricuspid valve is normal in structure. Tricuspid valve regurgitation is mild. Aortic Valve: The aortic valve is tricuspid. There is moderate calcification of the aortic valve. Aortic valve regurgitation is not visualized. No aortic stenosis is present. Pulmonic Valve: The pulmonic valve was normal in structure. Pulmonic valve regurgitation is not visualized. Aorta: The aortic root is normal in size and structure. Venous: The inferior vena cava is normal in size with greater than 50% respiratory variability, suggesting right atrial pressure of 3 mmHg. IAS/Shunts: Negative bubble study, no evidence for ASD or PFO.  LEFT VENTRICLE PLAX 2D LVIDd:         3.40 cm     Diastology LVIDs:         2.50 cm     LV e' medial:    3.30 cm/s LV PW:         0.80 cm     LV E/e' medial:  19.2 LV IVS:        0.90 cm     LV e' lateral:   5.78 cm/s LVOT diam:     2.00 cm     LV E/e' lateral:  10.9 LV SV:         76 LV SV Index:   50 LVOT Area:     3.14 cm  LV Volumes (MOD) LV vol d, MOD A2C: 43.5 ml LV vol d, MOD A4C: 47.7 ml LV vol s, MOD A2C: 14.6 ml LV vol s, MOD A4C: 17.3 ml LV SV MOD A2C:     28.9 ml LV SV MOD A4C:     47.7 ml LV SV MOD BP:      32.0 ml RIGHT VENTRICLE            IVC RV S prime:     9.14 cm/s  IVC diam: 1.50 cm TAPSE (M-mode): 2.0 cm LEFT ATRIUM             Index         RIGHT ATRIUM           Index LA diam:        1.90 cm 1.25 cm/m   RA Area:     10.80 cm LA Vol (A2C):   27.9 ml 18.40 ml/m  RA Volume:   22.50 ml  14.84 ml/m LA Vol (A4C):   22.1 ml 14.57 ml/m LA Biplane Vol: 26.7 ml 17.60 ml/m  AORTIC VALVE LVOT Vmax:   85.70 cm/s LVOT Vmean:  61.400 cm/s LVOT VTI:    0.241 m  AORTA Ao Root diam: 2.80 cm Ao Asc diam:  3.00 cm MITRAL VALVE               TRICUSPID VALVE MV Area (PHT): 2.12 cm    TR Peak grad:   27.9 mmHg MV Area VTI:   2.62 cm    TR Mean grad:   18.0 mmHg MV Peak grad:  4.2 mmHg    TR Vmax:        264.00 cm/s MV Mean grad:  2.0 mmHg    TR Vmean:       204.0 cm/s MV Vmax:       1.03 m/s MV Vmean:      64.4 cm/s   SHUNTS MV Decel Time: 357 msec    Systemic VTI:  0.24 m MR Peak grad: 105.3 mmHg   Systemic Diam: 2.00 cm MR Vmax:      513.00 cm/s MV E velocity: 63.20 cm/s MV A velocity: 74.40 cm/s MV E/A ratio:  0.85 Dalton McleanMD Electronically signed by Wilfred Lacy Signature Date/Time: 06/15/2023/2:15:07 PM    Final    EEG adult  Result Date: 06/15/2023 Charlsie Quest, MD     06/15/2023  9:43 AM Patient Name: BERLINE EASTERDAY MRN: [id number] Epilepsy Attending: Charlsie Quest Referring Physician/Provider: Tereasa Coop, MD Duration: 24.26 mins Patient history: 89yo F with ams getting eeg to evaluate for seizure Level of alertness: Awake, asleep AEDs during EEG study: LEV Technical aspects: This EEG study was done with scalp electrodes positioned according to the 10-20 International system of electrode placement. Electrical activity was reviewed with band pass filter of 1-70Hz , sensitivity of 7 uV/mm, display speed of 66mm/sec with a 60Hz  notched filter applied as appropriate. EEG data were recorded continuously and digitally stored.  Video monitoring was available and reviewed as appropriate. Description: The posterior dominant rhythm consists of 8 Hz activity of moderate voltage (25-35 uV) seen predominantly in posterior head regions, symmetric  and reactive to eye opening and eye closing. Sleep was characterized by vertex waves, sleep spindles (12 to 14 Hz), maximal frontocentral region. EEG showed intermittent generalized 3  to 6 Hz theta-delta slowing. Physiologic photic driving was seen during photic stimulation. Hyperventilation was not performed.   ABNORMALITY - Intermittent slow, generalized IMPRESSION: This study is suggestive of mild diffuse encephalopathy. No seizures or epileptiform discharges were seen throughout the recording. Priyanka Annabelle Harman   CT ANGIO HEAD NECK W WO CM  Result Date: 06/15/2023 CLINICAL DATA:  Stroke workup EXAM: CT ANGIOGRAPHY HEAD AND NECK WITH AND WITHOUT CONTRAST TECHNIQUE: Multidetector CT imaging of the head and neck was performed using the standard protocol during bolus administration of intravenous contrast. Multiplanar CT image reconstructions and MIPs were obtained to evaluate the vascular anatomy. Carotid stenosis measurements (when applicable) are obtained utilizing NASCET criteria, using the distal internal carotid diameter as the denominator. RADIATION DOSE REDUCTION: This exam was performed according to the departmental dose-optimization program which includes automated exposure control, adjustment of the mA and/or kV according to patient size and/or use of iterative reconstruction technique. CONTRAST:  75mL OMNIPAQUE IOHEXOL 350 MG/ML SOLN COMPARISON:  Brain MRI from yesterday FINDINGS: CT HEAD FINDINGS Brain: Known acute infarcts at the right thalamocapsular junction and inferior left cerebellum by MRI. No acute hemorrhage, hydrocephalus, mass, or collection. Vascular: No hyperdense vessel or unexpected calcification. Skull: Normal. Negative for fracture or focal lesion. Sinuses/Orbits: No acute finding. Review of the MIP images confirms the above findings CTA NECK FINDINGS Aortic arch: Atheromatous calcification with 3 vessel branching. No acute finding or covered aneurysm Right carotid system: Mild for  age atheromatous plaque mainly at the bifurcation without stenosis. No ulceration or dissection. Mild ICA beading is possible. Left carotid system: Atheromatous calcification at the bifurcation without stenosis or beading. No dissection. Vertebral arteries: No proximal subclavian stenosis. Atheromatous plaque at the right vertebral origin. No vertebral beading, flow reducing stenosis, or dissection. Skeleton: Generalized degenerative endplate and facet spurring. No acute or aggressive finding Other neck: No acute finding Upper chest: No acute finding Review of the MIP images confirms the above findings CTA HEAD FINDINGS Anterior circulation: Atheromatous calcification of the cavernous carotids. No major branch occlusion, beading, or aneurysm. Atheromatous irregularity of the MCA branches with multifocal moderate M2 narrowing on both sides. Moderate right M1 origin stenosis, see coronal thick MIPS. Posterior circulation: The vertebral and basilar arteries are smoothly contoured and widely patent. Atheromatous irregularity of the posterior cerebral arteries with moderate stenosis at the right P2 and P3 levels. Venous sinuses: Unremarkable for arterial timing Anatomic variants: As above Review of the MIP images confirms the above findings IMPRESSION: 1. No emergent arterial finding. 2. Advanced intracranial atherosclerosis with multifocal moderate branch stenosis. 3. Atherosclerosis in the neck without flow reducing stenosis. 4. No intracranial hemorrhage. No visible progression of acute infarcts seen by MRI. Electronically Signed   By: Tiburcio Pea M.D.   On: 06/15/2023 06:51   MR BRAIN W WO CONTRAST  Result Date: 06/15/2023 CLINICAL DATA:  Initial evaluation for acute altered mental status. EXAM: MRI HEAD WITHOUT AND WITH CONTRAST TECHNIQUE: Multiplanar, multiecho pulse sequences of the brain and surrounding structures were obtained without and with intravenous contrast. CONTRAST:  4.2mL GADAVIST GADOBUTROL 1  MMOL/ML IV SOLN COMPARISON:  CT from earlier the same day. FINDINGS: Brain: Examination degraded by motion artifact. Age-related cerebral atrophy. Patchy and confluent T2/FLAIR hyperintensity involving the periventricular deep white matter both cerebral hemispheres, consistent with chronic small vessel ischemic disease, moderately advanced in nature. Small remote left cerebellar infarct noted. 1.8 cm focus of restricted diffusion involving the ventral right thalamocapsular region, consistent with an evolving acute to  early subacute ischemic infarct (series 5, image 23). Additional small acute to early subacute infarct involving the inferior left cerebellum (series 5, image 7). No associated hemorrhage or significant mass effect. No other diffusion signal abnormality identified. However, there are a few small nodular foci of enhancement involving the bilateral thalami (series 16, images 72, 69). While these findings are nonspecific, changes are suspected to reflect enhancement related to evolving subacute ischemic changes. Gray-white matter differentiation otherwise maintained. No acute intracranial hemorrhage. No significant chronic intracranial blood products visible on this motion degraded exam. No mass lesion, midline shift, or mass effect. Ventricular prominence related global parenchymal volume loss without hydrocephalus. No extra-axial fluid collection. Pituitary gland suprasellar region within normal limits. No other abnormal enhancement. Vascular: Major intracranial vascular flow voids are maintained. Skull and upper cervical spine: Degenerative thickening noted about the tectorial membrane. Craniocervical junction otherwise unremarkable. Bone marrow signal intensity within normal limits. No scalp soft tissue abnormality. Sinuses/Orbits: Prior bilateral ocular lens replacement. Paranasal sinuses are largely clear. Small bilateral mastoid effusions, of doubtful significance. Visualized nasopharynx  unremarkable. Other: None. IMPRESSION: 1. 1.8 cm acute to early subacute ischemic nonhemorrhagic infarct involving the ventral right thalamocapsular region. 2. Additional small acute to early subacute infarct involving the inferior left cerebellum. 3. Few small nodular foci of enhancement involving the bilateral thalami. While these findings are nonspecific, changes are suspected to reflect enhancement related to evolving subacute ischemic changes. Short interval follow-up MRI to ensure these changes resolve recommended. 4. Underlying age-related cerebral atrophy with moderate chronic microvascular ischemic disease. Electronically Signed   By: Rise Mu M.D.   On: 06/15/2023 01:34   CT HEAD WO CONTRAST  Result Date: 06/14/2023 CLINICAL DATA:  Altered mental status EXAM: CT HEAD WITHOUT CONTRAST TECHNIQUE: Contiguous axial images were obtained from the base of the skull through the vertex without intravenous contrast. RADIATION DOSE REDUCTION: This exam was performed according to the departmental dose-optimization program which includes automated exposure control, adjustment of the mA and/or kV according to patient size and/or use of iterative reconstruction technique. COMPARISON:  05/18/2023 FINDINGS: Brain: There is atrophy and chronic small vessel disease changes. No acute intracranial abnormality. Specifically, no hemorrhage, hydrocephalus, mass lesion, acute infarction, or significant intracranial injury. Vascular: No hyperdense vessel or unexpected calcification. Skull: No acute calvarial abnormality. Sinuses/Orbits: No acute findings Other: None IMPRESSION: Atrophy, chronic microvascular disease. No acute intracranial abnormality. Electronically Signed   By: Charlett Nose M.D.   On: 06/14/2023 17:58   DG Chest Portable 1 View  Result Date: 06/14/2023 CLINICAL DATA:  Loss of consciousness.  Altered mental status. EXAM: PORTABLE CHEST 1 VIEW COMPARISON:  AP chest 05/21/2023, 03/16/2023,  10/14/2022 FINDINGS: Cardiac silhouette and mediastinal contours are unchanged and within normal limits. Moderate calcification within the aortic arch. Left chest wall cardiac loop recorder is again noted. Improved aeration of the right lung. No definite acute lung opacity. The current lung appearance is similar to baseline 10/14/2022 radiographs. No pleural effusion pneumothorax. No acute skeletal abnormality. IMPRESSION: Resolution of the subtle right lung airspace opacities seen on most recent 05/21/2023 radiographs. No definite acute lung opacity. Electronically Signed   By: Neita Garnet M.D.   On: 06/14/2023 17:28        Scheduled Meds:  aspirin EC  81 mg Oral q AM   clopidogrel  75 mg Oral Q breakfast   enoxaparin (LOVENOX) injection  40 mg Subcutaneous Q24H   ezetimibe  10 mg Oral QHS   insulin aspart  0-5 Units Subcutaneous  QHS   insulin aspart  0-9 Units Subcutaneous TID WC   insulin glargine-yfgn  5 Units Subcutaneous Daily   levothyroxine  75 mcg Intravenous Daily   predniSONE  5 mg Oral Q breakfast   sodium chloride flush  3 mL Intravenous Q12H   sodium chloride flush  3 mL Intravenous Q12H   Continuous Infusions:  levETIRAcetam 500 mg (06/16/23 0955)          Glade Lloyd, MD [facility] 06/16/2023, 10:52 AM

## 2023-06-17 DIAGNOSIS — G9341 Metabolic encephalopathy: Secondary | ICD-10-CM | POA: Diagnosis not present

## 2023-06-17 DIAGNOSIS — I739 Peripheral vascular disease, unspecified: Secondary | ICD-10-CM | POA: Diagnosis not present

## 2023-06-17 DIAGNOSIS — Z8659 Personal history of other mental and behavioral disorders: Secondary | ICD-10-CM | POA: Diagnosis not present

## 2023-06-17 DIAGNOSIS — N179 Acute kidney failure, unspecified: Secondary | ICD-10-CM

## 2023-06-17 LAB — COMPREHENSIVE METABOLIC PANEL
ALT: 24 U/L (ref 0–44)
AST: 20 U/L (ref 15–41)
Albumin: 2.6 g/dL — ABNORMAL LOW (ref 3.5–5.0)
Alkaline Phosphatase: 96 U/L (ref 38–126)
Anion gap: 4 — ABNORMAL LOW (ref 5–15)
BUN: 10 mg/dL (ref 8–23)
CO2: 26 mmol/L (ref 22–32)
Calcium: 9.2 mg/dL (ref 8.9–10.3)
Chloride: 103 mmol/L (ref 98–111)
Creatinine, Ser: 0.69 mg/dL (ref 0.44–1.00)
GFR, Estimated: >60 mL/min (ref >60)
Glucose, Bld: 118 mg/dL — ABNORMAL HIGH (ref 70–99)
Potassium: 3.2 mmol/L — ABNORMAL LOW (ref 3.5–5.1)
Sodium: 133 mmol/L — ABNORMAL LOW (ref 135–145)
Total Bilirubin: 0.5 mg/dL (ref <1.2)
Total Protein: 5.8 g/dL — ABNORMAL LOW (ref 6.5–8.1)

## 2023-06-17 LAB — CBC
HCT: 25.7 % — ABNORMAL LOW (ref 36.0–46.0)
Hemoglobin: 8.3 g/dL — ABNORMAL LOW (ref 12.0–15.0)
MCH: 31.2 pg (ref 26.0–34.0)
MCHC: 32.3 g/dL (ref 30.0–36.0)
MCV: 96.6 fL (ref 80.0–100.0)
Platelets: 314 10*3/uL (ref 150–400)
RBC: 2.66 MIL/uL — ABNORMAL LOW (ref 3.87–5.11)
RDW: 14 % (ref 11.5–15.5)
WBC: 6.3 10*3/uL (ref 4.0–10.5)
nRBC: 0 % (ref 0.0–0.2)

## 2023-06-17 LAB — GLUCOSE, CAPILLARY
Glucose-Capillary: 230 mg/dL — ABNORMAL HIGH (ref 70–99)
Glucose-Capillary: 295 mg/dL — ABNORMAL HIGH (ref 70–99)
Glucose-Capillary: 290 mg/dL — ABNORMAL HIGH (ref 70–99)
Glucose-Capillary: 268 mg/dL — ABNORMAL HIGH (ref 70–99)

## 2023-06-17 LAB — MAGNESIUM: Magnesium: 1.7 mg/dL (ref 1.7–2.4)

## 2023-06-17 MED ORDER — ADULT MULTIVITAMIN W/MINERALS CH
1.0000 | ORAL_TABLET | Freq: Every day | ORAL | Status: DC
  Administered 2023-06-17 – 2023-06-21 (×5): 1 via ORAL
  Filled 2023-06-17 (×5): qty 1

## 2023-06-17 MED ORDER — INSULIN GLARGINE-YFGN 100 UNIT/ML ~~LOC~~ SOLN
5.0000 [IU] | Freq: Every day | SUBCUTANEOUS | Status: DC
  Administered 2023-06-17 – 2023-06-21 (×5): 5 [IU] via SUBCUTANEOUS
  Filled 2023-06-17 (×5): qty 0.05

## 2023-06-17 MED ORDER — ENSURE ENLIVE PO LIQD: 237.0000 mL | Freq: Two times a day (BID) | ORAL | Status: DC

## 2023-06-17 MED ORDER — POTASSIUM CHLORIDE CRYS ER 20 MEQ PO TBCR
40.0000 meq | EXTENDED_RELEASE_TABLET | ORAL | Status: AC
  Administered 2023-06-17: 40 meq via ORAL
  Filled 2023-06-17 (×2): qty 2

## 2023-06-17 NOTE — Plan of Care (Signed)
Problem: Education: Goal: Knowledge of General Education information will improve Description: Including pain rating scale, medication(s)/side effects and non-pharmacologic comfort measures 06/17/2023 1624 by Dannielle Burn, RN Outcome: Progressing 06/17/2023 1557 by Dannielle Burn, RN Outcome: Progressing   Problem: Health Behavior/Discharge Planning: Goal: Ability to manage health-related needs will improve 06/17/2023 1624 by Dannielle Burn, RN Outcome: Progressing 06/17/2023 1557 by Dannielle Burn, RN Outcome: Progressing   Problem: Clinical Measurements: Goal: Ability to maintain clinical measurements within normal limits will improve 06/17/2023 1624 by Dannielle Burn, RN Outcome: Progressing 06/17/2023 1557 by Dannielle Burn, RN Outcome: Progressing Goal: Will remain free from infection 06/17/2023 1624 by Dannielle Burn, RN Outcome: Progressing 06/17/2023 1557 by Dannielle Burn, RN Outcome: Progressing Goal: Diagnostic test results will improve 06/17/2023 1624 by Dannielle Burn, RN Outcome: Progressing 06/17/2023 1557 by Dannielle Burn, RN Outcome: Progressing Goal: Respiratory complications will improve 06/17/2023 1624 by Dannielle Burn, RN Outcome: Progressing 06/17/2023 1557 by Dannielle Burn, RN Outcome: Progressing Goal: Cardiovascular complication will be avoided 06/17/2023 1624 by Dannielle Burn, RN Outcome: Progressing 06/17/2023 1557 by Dannielle Burn, RN Outcome: Progressing   Problem: Activity: Goal: Risk for activity intolerance will decrease 06/17/2023 1624 by Dannielle Burn, RN Outcome: Progressing 06/17/2023 1557 by Dannielle Burn, RN Outcome: Progressing   Problem: Nutrition: Goal: Adequate nutrition will be maintained 06/17/2023 1624 by Dannielle Burn, RN Outcome: Progressing 06/17/2023 1557 by Dannielle Burn, RN Outcome: Progressing   Problem: Coping: Goal: Level of anxiety will decrease 06/17/2023  1624 by Dannielle Burn, RN Outcome: Progressing 06/17/2023 1557 by Dannielle Burn, RN Outcome: Progressing   Problem: Elimination: Goal: Will not experience complications related to bowel motility 06/17/2023 1624 by Dannielle Burn, RN Outcome: Progressing 06/17/2023 1557 by Dannielle Burn, RN Outcome: Progressing Goal: Will not experience complications related to urinary retention 06/17/2023 1624 by Dannielle Burn, RN Outcome: Progressing 06/17/2023 1557 by Dannielle Burn, RN Outcome: Progressing   Problem: Pain Management: Goal: General experience of comfort will improve 06/17/2023 1624 by Dannielle Burn, RN Outcome: Progressing 06/17/2023 1557 by Dannielle Burn, RN Outcome: Progressing   Problem: Safety: Goal: Ability to remain free from injury will improve 06/17/2023 1624 by Dannielle Burn, RN Outcome: Progressing 06/17/2023 1557 by Dannielle Burn, RN Outcome: Progressing   Problem: Skin Integrity: Goal: Risk for impaired skin integrity will decrease 06/17/2023 1624 by Dannielle Burn, RN Outcome: Progressing 06/17/2023 1557 by Dannielle Burn, RN Outcome: Progressing   Problem: Education: Goal: Ability to describe self-care measures that may prevent or decrease complications (Diabetes Survival Skills Education) will improve 06/17/2023 1624 by Dannielle Burn, RN Outcome: Progressing 06/17/2023 1557 by Dannielle Burn, RN Outcome: Progressing Goal: Individualized Educational Video(s) 06/17/2023 1624 by Dannielle Burn, RN Outcome: Progressing 06/17/2023 1557 by Dannielle Burn, RN Outcome: Progressing   Problem: Coping: Goal: Ability to adjust to condition or change in health will improve 06/17/2023 1624 by Dannielle Burn, RN Outcome: Progressing 06/17/2023 1557 by Dannielle Burn, RN Outcome: Progressing   Problem: Fluid Volume: Goal: Ability to maintain a balanced intake and output will improve 06/17/2023 1624 by Dannielle Burn, RN Outcome: Progressing 06/17/2023 1557 by Dannielle Burn, RN Outcome: Progressing   Problem: Health Behavior/Discharge Planning: Goal: Ability to identify and utilize available resources and services will improve 06/17/2023 1624 by Dannielle Burn, RN Outcome: Progressing 06/17/2023 1557 by Dannielle Burn,  RN Outcome: Progressing Goal: Ability to manage health-related needs will improve 06/17/2023 1624 by Dannielle Burn, RN Outcome: Progressing 06/17/2023 1557 by Dannielle Burn, RN Outcome: Progressing   Problem: Metabolic: Goal: Ability to maintain appropriate glucose levels will improve 06/17/2023 1624 by Dannielle Burn, RN Outcome: Progressing 06/17/2023 1557 by Dannielle Burn, RN Outcome: Progressing   Problem: Nutritional: Goal: Maintenance of adequate nutrition will improve 06/17/2023 1624 by Dannielle Burn, RN Outcome: Progressing 06/17/2023 1557 by Dannielle Burn, RN Outcome: Progressing Goal: Progress toward achieving an optimal weight will improve 06/17/2023 1624 by Dannielle Burn, RN Outcome: Progressing 06/17/2023 1557 by Dannielle Burn, RN Outcome: Progressing   Problem: Skin Integrity: Goal: Risk for impaired skin integrity will decrease 06/17/2023 1624 by Dannielle Burn, RN Outcome: Progressing 06/17/2023 1557 by Dannielle Burn, RN Outcome: Progressing   Problem: Tissue Perfusion: Goal: Adequacy of tissue perfusion will improve 06/17/2023 1624 by Dannielle Burn, RN Outcome: Progressing 06/17/2023 1557 by Dannielle Burn, RN Outcome: Progressing   Problem: Education: Goal: Knowledge of disease or condition will improve 06/17/2023 1624 by Dannielle Burn, RN Outcome: Progressing 06/17/2023 1557 by Dannielle Burn, RN Outcome: Progressing Goal: Knowledge of secondary prevention will improve (MUST DOCUMENT ALL) 06/17/2023 1624 by Dannielle Burn, RN Outcome: Progressing 06/17/2023 1557 by Dannielle Burn, RN Outcome: Progressing Goal: Knowledge of patient specific risk factors will improve Loraine Leriche N/A or DELETE if not current risk factor) 06/17/2023 1624 by Dannielle Burn, RN Outcome: Progressing 06/17/2023 1557 by Dannielle Burn, RN Outcome: Progressing   Problem: Ischemic Stroke/TIA Tissue Perfusion: Goal: Complications of ischemic stroke/TIA will be minimized 06/17/2023 1624 by Dannielle Burn, RN Outcome: Progressing 06/17/2023 1557 by Dannielle Burn, RN Outcome: Progressing   Problem: Coping: Goal: Will verbalize positive feelings about self 06/17/2023 1624 by Dannielle Burn, RN Outcome: Progressing 06/17/2023 1557 by Dannielle Burn, RN Outcome: Progressing Goal: Will identify appropriate support needs 06/17/2023 1624 by Dannielle Burn, RN Outcome: Progressing 06/17/2023 1557 by Dannielle Burn, RN Outcome: Progressing   Problem: Health Behavior/Discharge Planning: Goal: Ability to manage health-related needs will improve 06/17/2023 1624 by Dannielle Burn, RN Outcome: Progressing 06/17/2023 1557 by Dannielle Burn, RN Outcome: Progressing Goal: Goals will be collaboratively established with patient/family 06/17/2023 1624 by Dannielle Burn, RN Outcome: Progressing 06/17/2023 1557 by Dannielle Burn, RN Outcome: Progressing   Problem: Self-Care: Goal: Ability to participate in self-care as condition permits will improve 06/17/2023 1624 by Dannielle Burn, RN Outcome: Progressing 06/17/2023 1557 by Dannielle Burn, RN Outcome: Progressing Goal: Verbalization of feelings and concerns over difficulty with self-care will improve 06/17/2023 1624 by Dannielle Burn, RN Outcome: Progressing 06/17/2023 1557 by Dannielle Burn, RN Outcome: Progressing Goal: Ability to communicate needs accurately will improve 06/17/2023 1624 by Dannielle Burn, RN Outcome: Progressing 06/17/2023 1557 by Dannielle Burn, RN Outcome: Progressing   Problem:  Nutrition: Goal: Risk of aspiration will decrease 06/17/2023 1624 by Dannielle Burn, RN Outcome: Progressing 06/17/2023 1557 by Dannielle Burn, RN Outcome: Progressing Goal: Dietary intake will improve 06/17/2023 1624 by Dannielle Burn, RN Outcome: Progressing 06/17/2023 1557 by Dannielle Burn, RN Outcome: Progressing

## 2023-06-17 NOTE — Progress Notes (Signed)
PROGRESS NOTE    [name] [name]  ZOX:[id number] DOB: 1934-04-17 DOA: 06/14/2023 PCP: [name], MD   Brief Narrative:  89 y.o. female with medical history significant for dementia, insulin-dependent T2DM, dermatomyositis (on Plaquenil and chronic prednisone 5 mg daily), PAD, history of syncope due to orthostasis and vasovagal episodes, history of seizure, hypothyroidism, macrocytic anemia, recent hospitalization in October 2024 for acute encephalopathy secondary to Klebsiella UTI treated with IV antibiotics followed by an admission from 05/18/2023-05/24/2023 for multifocal aspiration pneumonia treated with IV antibiotics along with acute metabolic encephalopathy presented with increased lethargy.  Workup revealed acute to early subacute ischemic infarct involving right thalamocapsular region, additional small acute to early subacute infarct in the inferior left cerebellum.  Neurology was consulted.  Palliative care was also consulted.  Assessment & Plan:   Acute/subacute infarct in the right thalamocapsular region, small/subacute infarct in the inferior left cerebellum Acute metabolic encephalopathy History of dementia Hyperlipidemia Dysphagia -Possibly due to advanced intracranial atherosclerosis -CTA head and neck showed advanced intracranial atherosclerosis with multifocal moderate pan stenosis. -2D echo showed EF of 60 to 60%, negative bubble study.  Lower extremity duplex did not show any DVT. -EEG showed mild diffuse encephalopathy.  No seizures -LDL 167.  A1c 8.5.  Continue ezetimibe.  Patient intolerant of statins per daughters -Neurology recommended aspirin and Plavix for 3 months then Plavix alone -Patient is mostly bedbound hence PT did not evaluate the patient -Diet as per SLP recommendations  Failure to thrive Poor oral intake Severe malnutrition -Oral intake remains poor. -Palliative care following.  Follow recommendations -Consult  dietitian  Leukocytosis -Possible reactive.  Resolved  Anemia of chronic disease -From chronic illnesses per hemoglobin stable.  Monitor intermittently  Hyponatremia -Possibly from poor oral intake.  Sodium 133 today.  Monitor  Hypokalemia -Replace.  Hematemesis  Diabetes mellitus type 2 with hyperglycemia and hypoglycemia -Continue 5 units Semglee.  CBGs with SSI  Dermatomyositis chronic debility with bed/wheelchair bound -Current home prednisone.  Resume Plaquenil and methotrexate on discharge.  Outpatient follow-up with rheumatology  History of seizures -Continue Keppra.  Outpatient follow-up with neurology  Hypertension -Monitor blood pressure.  Not on any antihypertensives at this time  Peripheral artery disease -Status post angioplasty of bilateral common iliac artery stenosis in 01/2023.  Continue aspirin, Plavix, Zetia.   Stage II mid sacrum pressure injury: Present on admission -Continue local wound care  DVT prophylaxis: Lovenox Code Status: DNR Family Communication: Daughter at bedside.   Disposition Plan: Status is: Inpatient Remains inpatient appropriate because: Of severity of illness.  Oral intake still extremely poor  Consultants: Neurology/palliative care  Procedures: As above  Antimicrobials: None  Subjective: Patient seen and examined at bedside.  Wakes up slightly, does not participate in conversation much.  No seizures, vomiting or agitation reported.  Daughter at bedside reports that oral intake has remained poor. Objective: Vitals:   06/16/23 2304 06/17/23 0310 06/17/23 0311 06/17/23 0744  BP: (!) 160/54 (!) 142/62  (!) 177/72  Pulse: 61 66 66 72  Resp: 13 12 14 16   Temp: 98 F (36.7 C)   (!) 97.1 F (36.2 C)  TempSrc: Axillary Axillary  Oral  SpO2: 98% 97%  95%  Weight:      Height:        Intake/Output Summary (Last 24 hours) at 06/17/2023 0928 Last data filed at 06/17/2023 0744 Gross per 24 hour  Intake 240 ml  Output 600 ml   Net -360 ml   Filed Weights   06/14/23  1647 06/15/23 0438 06/16/23 0342  Weight: 45.5 kg 46.1 kg 45.9 kg    Examination:  General: Currently on room air.  No distress.  Chronically ill and deconditioned abdomen. ENT/neck: No thyromegaly.  JVD is not elevated  respiratory: Decreased breath sounds at bases bilaterally with some crackles; no wheezing  CVS: S1-S2 heard, rate controlled currently Abdominal: Soft, nontender, slightly distended; no organomegaly, bowel sounds are heard Extremities: Trace lower extremity edema; no cyanosis  CNS: Wakes up only very slightly, does not participate in meaningful conversation.  Lymph: No obvious lymphadenopathy Skin: No obvious ecchymosis/lesions  psych: Extremely flat affect.  Not agitated currently.   Musculoskeletal: No obvious joint swelling/deformity     Data Reviewed: I have personally reviewed following labs and imaging studies  CBC: Recent Labs  Lab 06/14/23 1730 06/14/23 1750 06/15/23 0330 06/16/23 0535 06/17/23 0447  WBC 18.6*  --  10.4 7.5 6.3  NEUTROABS 17.0*  --   --   --   --   HGB 9.2* 9.5* 11.0* 8.5* 8.3*  HCT 27.9* 28.0* 33.0* 25.3* 25.7*  MCV 98.9  --  98.5 95.8 96.6  PLT 309  --  241 324 314   Basic Metabolic Panel: Recent Labs  Lab 06/14/23 1730 06/14/23 1750 06/15/23 0330 06/15/23 0607 06/15/23 1827 06/15/23 2216 06/16/23 0535 06/16/23 0757 06/17/23 0447  NA 128* 131* 133*   < > 133* 128* 132* 129* 133*  K 4.0 4.1 3.6  --   --   --  3.7  --  3.2*  CL 92* 91* 97*  --   --   --  98  --  103  CO2 29  --  29  --   --   --  23  --  26  GLUCOSE 210* 218* 141*  --   --   --  276* 462* 118*  BUN 16 15 14   --   --   --  15  --  10  CREATININE 0.83 0.90 0.74  --   --   --  0.81  --  0.69  CALCIUM 9.8  --  9.7  --   --   --  9.5  --  9.2  MG  --   --   --   --   --   --   --   --  1.7   < > = values in this interval not displayed.   GFR: Estimated Creatinine Clearance: 35.9 mL/min (by C-G formula based on  SCr of 0.69 mg/dL). Liver Function Tests: Recent Labs  Lab 06/14/23 1730 06/15/23 0330 06/16/23 0535 06/17/23 0447  AST 26 24 21 20   ALT 39 34 28 24  ALKPHOS 125 109 101 96  BILITOT 0.4 0.7 0.6 0.5  PROT 6.9 6.6 5.8* 5.8*  ALBUMIN 3.2* 3.0* 2.7* 2.6*   No results for input(s): "LIPASE", "AMYLASE" in the last 168 hours. Recent Labs  Lab 06/14/23 1730  AMMONIA 20   Coagulation Profile: Recent Labs  Lab 06/14/23 1730  INR 1.1   Cardiac Enzymes: No results for input(s): "CKTOTAL", "CKMB", "CKMBINDEX", "TROPONINI" in the last 168 hours. BNP (last 3 results) No results for input(s): "PROBNP" in the last 8760 hours. HbA1C: Recent Labs    06/15/23 0330  HGBA1C 8.5*   CBG: Recent Labs  Lab 06/16/23 1111 06/16/23 1328 06/16/23 1603 06/16/23 2053 06/17/23 0741  GLUCAP 406* 237* 164* 77 230*   Lipid Profile: Recent Labs    06/15/23 0330  CHOL  260*  HDL 83  LDLCALC 167*  TRIG 52  CHOLHDL 3.1   Thyroid Function Tests: No results for input(s): "TSH", "T4TOTAL", "FREET4", "T3FREE", "THYROIDAB" in the last 72 hours. Anemia Panel: Recent Labs    06/15/23 0607  VITAMINB12 2,107*   Sepsis Labs: No results for input(s): "PROCALCITON", "LATICACIDVEN" in the last 168 hours.  Recent Results (from the past 240 hour(s))  Blood culture (routine x 2)     Status: None (Preliminary result)   Collection Time: 06/14/23  8:20 PM   Specimen: BLOOD  Result Value Ref Range Status   Specimen Description   Final    BLOOD LEFT ANTECUBITAL Performed at [facility], [address], [place], [place] [address]    Special Requests   Final    BOTTLES DRAWN AEROBIC AND ANAEROBIC Blood Culture adequate volume Performed at [facility], [address], [place], [place] [address]    Culture   Final    NO GROWTH 3 DAYS Performed at [facility], [address] [place], [place] [address]    Report Status PENDING  Incomplete  Culture,  blood (Routine X 2) w Reflex to ID Panel     Status: None (Preliminary result)   Collection Time: 06/15/23  3:30 AM   Specimen: BLOOD  Result Value Ref Range Status   Specimen Description   Final    BLOOD BLOOD RIGHT FOREARM Performed at [facility], [address], [place], [place] [address]    Special Requests   Final    BOTTLES DRAWN AEROBIC AND ANAEROBIC Blood Culture adequate volume Performed at [facility], [address] [place], [place] [address]    Culture   Final    NO GROWTH 2 DAYS Performed at [facility], [address], [place], [place] [address]    Report Status PENDING  Incomplete  Culture, blood (Routine X 2) w Reflex to ID Panel     Status: None (Preliminary result)   Collection Time: 06/15/23  6:07 AM   Specimen: BLOOD  Result Value Ref Range Status   Specimen Description BLOOD BLOOD RIGHT HAND  Final   Special Requests   Final    BOTTLES DRAWN AEROBIC AND ANAEROBIC Blood Culture adequate volume   Culture   Final    NO GROWTH 2 DAYS Performed at [facility], [address] [place], [place] [address]    Report Status PENDING  Incomplete         Radiology Studies: VAS [place] LOWER EXTREMITY VENOUS (DVT)  Result Date: 06/16/2023  Lower Venous DVT Study Patient Name:  [name] [name]  Date of Exam:   06/15/2023 Medical Rec #: [id number]        Accession #:    [id number] Date of Birth: 07-15-1934        Patient Gender: F Patient Age:   29 years Exam Location:  [facility] Procedure:      VAS [place] LOWER EXTREMITY VENOUS (DVT) Referring Phys: Scheryl Marten XU --------------------------------------------------------------------------------  Indications: Stroke, and Embolic stroke. Other Indications: Anemia. Comparison Study: No prior exam. Performing Technologist: Fernande Bras  Examination Guidelines: A complete evaluation includes B-mode imaging, spectral Doppler, color Doppler, and power Doppler as needed of  all accessible portions of each vessel. Bilateral testing is considered an integral part of a complete examination. Limited examinations for reoccurring indications may be performed as noted. The reflux portion of the exam is performed with the  patient in reverse Trendelenburg.  +---------+---------------+---------+-----------+----------+--------------+ RIGHT    CompressibilityPhasicitySpontaneityPropertiesThrombus Aging +---------+---------------+---------+-----------+----------+--------------+ CFV      Full           Yes      Yes                                 +---------+---------------+---------+-----------+----------+--------------+ SFJ      Full                                                        +---------+---------------+---------+-----------+----------+--------------+ FV Prox  Full                                                        +---------+---------------+---------+-----------+----------+--------------+ FV Mid   Full                                                        +---------+---------------+---------+-----------+----------+--------------+ FV DistalFull                                                        +---------+---------------+---------+-----------+----------+--------------+ PFV      Full                                                        +---------+---------------+---------+-----------+----------+--------------+ POP      Full           Yes      Yes                                 +---------+---------------+---------+-----------+----------+--------------+ PTV      Full                                                        +---------+---------------+---------+-----------+----------+--------------+ PERO     Full                                                        +---------+---------------+---------+-----------+----------+--------------+ Distal FV limited due to calcific shadowing in atery.   +---------+---------------+---------+-----------+----------+--------------+ LEFT     CompressibilityPhasicitySpontaneityPropertiesThrombus Aging +---------+---------------+---------+-----------+----------+--------------+ CFV      Full           Yes  Yes                                 +---------+---------------+---------+-----------+----------+--------------+ SFJ      Full                                                        +---------+---------------+---------+-----------+----------+--------------+ FV Prox  Full                                                        +---------+---------------+---------+-----------+----------+--------------+ FV Mid   Full                                                        +---------+---------------+---------+-----------+----------+--------------+ FV DistalFull                                                        +---------+---------------+---------+-----------+----------+--------------+ PFV      Full                                                        +---------+---------------+---------+-----------+----------+--------------+ POP      Full           Yes      Yes                                 +---------+---------------+---------+-----------+----------+--------------+ PTV      Full                                                        +---------+---------------+---------+-----------+----------+--------------+ PERO     Full                                                        +---------+---------------+---------+-----------+----------+--------------+ Distal FV limited due to calcific shadowing in atery.    Summary: BILATERAL: - No evidence of deep vein thrombosis seen in the lower extremities, bilaterally. -No evidence of popliteal cyst, bilaterally.   *See table(s) above for measurements and observations. Electronically signed by Heath Lark on 06/16/2023 at 4:43:39 PM.    Final     ECHOCARDIOGRAM COMPLETE  Result Date: 06/15/2023    ECHOCARDIOGRAM REPORT   Patient Name:   [name] [name]  Wiest Date of Exam: 06/15/2023 Medical Rec #:  [id number]       Height:       67.0 in Accession #:    [id number]      Weight:       101.6 lb Date of Birth:  01/24/1934       BSA:          1.517 m Patient Age:    89 years        BP:           153/59 mmHg Patient Gender: F               HR:           59 bpm. Exam Location:  Inpatient Procedure: 2D Echo, Cardiac Doppler and Color Doppler Indications:    CVA  History:        Patient has prior history of Echocardiogram examinations.  Sonographer:    Milda Smart Referring Phys: [id number] SUBRINA SUNDIL IMPRESSIONS  1. Left ventricular ejection fraction, by estimation, is 60 to 65%. The left ventricle has normal function. The left ventricle has no regional wall motion abnormalities. There is mild concentric left ventricular hypertrophy. Left ventricular diastolic parameters are consistent with Grade I diastolic dysfunction (impaired relaxation).  2. Right ventricular systolic function is normal. The right ventricular size is normal. There is normal pulmonary artery systolic pressure. The estimated right ventricular systolic pressure is 30.9 mmHg.  3. The mitral valve is degenerative. Mild mitral valve regurgitation. No evidence of mitral stenosis.  4. The aortic valve is tricuspid. There is moderate calcification of the aortic valve. Aortic valve regurgitation is not visualized. No aortic stenosis is present.  5. The inferior vena cava is normal in size with greater than 50% respiratory variability, suggesting right atrial pressure of 3 mmHg.  6. Negative bubble study, no evidence for ASD or PFO. FINDINGS  Left Ventricle: Left ventricular ejection fraction, by estimation, is 60 to 65%. The left ventricle has normal function. The left ventricle has no regional wall motion abnormalities. The left ventricular internal cavity size was normal in size. There is  mild  concentric left ventricular hypertrophy. Left ventricular diastolic parameters are consistent with Grade I diastolic dysfunction (impaired relaxation). Right Ventricle: The right ventricular size is normal. No increase in right ventricular wall thickness. Right ventricular systolic function is normal. There is normal pulmonary artery systolic pressure. The tricuspid regurgitant velocity is 2.64 m/s, and  with an assumed right atrial pressure of 3 mmHg, the estimated right ventricular systolic pressure is 30.9 mmHg. Left Atrium: Left atrial size was normal in size. Right Atrium: Right atrial size was normal in size. Pericardium: There is no evidence of pericardial effusion. Mitral Valve: The mitral valve is degenerative in appearance. There is mild calcification of the mitral valve leaflet(s). Mild to moderate mitral annular calcification. Mild mitral valve regurgitation. No evidence of mitral valve stenosis. MV peak gradient, 4.2 mmHg. The mean mitral valve gradient is 2.0 mmHg. Tricuspid Valve: The tricuspid valve is normal in structure. Tricuspid valve regurgitation is mild. Aortic Valve: The aortic valve is tricuspid. There is moderate calcification of the aortic valve. Aortic valve regurgitation is not visualized. No aortic stenosis is present. Pulmonic Valve: The pulmonic valve was normal in structure. Pulmonic valve regurgitation is not visualized. Aorta: The aortic root is normal in size and structure. Venous: The inferior vena cava is normal in size with greater than 50% respiratory variability, suggesting right atrial pressure of 3 mmHg.  IAS/Shunts: Negative bubble study, no evidence for ASD or PFO.  LEFT VENTRICLE PLAX 2D LVIDd:         3.40 cm     Diastology LVIDs:         2.50 cm     LV e' medial:    3.30 cm/s LV PW:         0.80 cm     LV E/e' medial:  19.2 LV IVS:        0.90 cm     LV e' lateral:   5.78 cm/s LVOT diam:     2.00 cm     LV E/e' lateral: 10.9 LV SV:         76 LV SV Index:   50 LVOT  Area:     3.14 cm  LV Volumes (MOD) LV vol d, MOD A2C: 43.5 ml LV vol d, MOD A4C: 47.7 ml LV vol s, MOD A2C: 14.6 ml LV vol s, MOD A4C: 17.3 ml LV SV MOD A2C:     28.9 ml LV SV MOD A4C:     47.7 ml LV SV MOD BP:      32.0 ml RIGHT VENTRICLE            IVC RV S prime:     9.14 cm/s  IVC diam: 1.50 cm TAPSE (M-mode): 2.0 cm LEFT ATRIUM             Index        RIGHT ATRIUM           Index LA diam:        1.90 cm 1.25 cm/m   RA Area:     10.80 cm LA Vol (A2C):   27.9 ml 18.40 ml/m  RA Volume:   22.50 ml  14.84 ml/m LA Vol (A4C):   22.1 ml 14.57 ml/m LA Biplane Vol: 26.7 ml 17.60 ml/m  AORTIC VALVE LVOT Vmax:   85.70 cm/s LVOT Vmean:  61.400 cm/s LVOT VTI:    0.241 m  AORTA Ao Root diam: 2.80 cm Ao Asc diam:  3.00 cm MITRAL VALVE               TRICUSPID VALVE MV Area (PHT): 2.12 cm    TR Peak grad:   27.9 mmHg MV Area VTI:   2.62 cm    TR Mean grad:   18.0 mmHg MV Peak grad:  4.2 mmHg    TR Vmax:        264.00 cm/s MV Mean grad:  2.0 mmHg    TR Vmean:       204.0 cm/s MV Vmax:       1.03 m/s MV Vmean:      64.4 cm/s   SHUNTS MV Decel Time: 357 msec    Systemic VTI:  0.24 m MR Peak grad: 105.3 mmHg   Systemic Diam: 2.00 cm MR Vmax:      513.00 cm/s MV E velocity: 63.20 cm/s MV A velocity: 74.40 cm/s MV E/A ratio:  0.85 Dalton McleanMD Electronically signed by Wilfred Lacy Signature Date/Time: 06/15/2023/2:15:07 PM    Final         Scheduled Meds:  aspirin EC  81 mg Oral q AM   clopidogrel  75 mg Oral Q breakfast   enoxaparin (LOVENOX) injection  40 mg Subcutaneous Q24H   ezetimibe  10 mg Oral QHS   insulin aspart  0-5 Units Subcutaneous QHS   insulin aspart  0-9 Units Subcutaneous TID WC  levETIRAcetam  500 mg Oral BID   levothyroxine  100 mcg Oral Q0600   potassium chloride  40 mEq Oral Q4H   predniSONE  5 mg Oral Q breakfast   sodium chloride flush  3 mL Intravenous Q12H   sodium chloride flush  3 mL Intravenous Q12H   Continuous Infusions:          Glade Lloyd, MD [facility] 06/17/2023, 9:28 AM

## 2023-06-17 NOTE — Progress Notes (Signed)
Daily Progress Note   Patient Name: [name] [name]       Date: 06/17/2023 DOB: 08-28-1933  Age: 89 y.o. MRN#: [id number] Attending Physician: Glade Lloyd, MD Primary Care Physician: [name], MD Admit Date: 06/14/2023  Reason for Consultation/Follow-up: Establishing goals of care  Subjective: Medical records reviewed including progress notes, labs, and imaging. Patient assessed at the bedside.  She remains very difficult to arouse with very poor oral intake.  Her daughter Ayesha Mohair is present visiting and daughter Cathlean Cower joined the conversation by phone.  We discussed the complex recovery process that occurs with an acute stroke in patients with underlying dementia/vascular changes.  I shared my concern that if patient does not improve in the next few days, that this may be her new baseline.  She does not have a lot of reserves.  I explored family's thoughts and feelings on her current illness and care preferences moving forward.  They are not sure if they would be able to support patient in the home, though this would be patient's strong preference if possible.  Counseled on the differences between home health and hospice services, reviewing the resources available if patient ultimately does need hospice support and is approaching end-of-life as a result of this acute illness.  Also reviewed the services available at an inpatient hospice facility, as patient likely would qualify.  We discussed concern for hospice "killing people" and morphine/euthanasia.  Patient has voiced this belief in the past when church members have been cared for in hospice facilities.  At the same time, it is also concerning that patient may need to be rapidly rehospitalized even if she returned home due to ongoing  complications from poor intake such as dehydration, acute kidney injury, etc.  We discussed that there is no good long-term solution for this and patient enter the cycle of prolonged suffering.  Patient's daughters would like to continue monitoring her condition and make stepwise decisions pending clinical course.  They are agreeable to ongoing goals of care discussions based on how this goes.  Provided with hard choices for living people booklet and PMT contact information.  Questions and concerns addressed. PMT will continue to support holistically.   Length of Stay: 3   Physical Exam Vitals and nursing note reviewed.  Constitutional:      General: She is not in acute distress.    Appearance: She is ill-appearing.  Cardiovascular:     Rate and Rhythm: Normal rate.  Pulmonary:     Effort: Pulmonary effort is normal. No respiratory distress.  Neurological:     Mental Status: She is lethargic.  Psychiatric:        Cognition and Memory: Cognition is impaired.            Vital Signs: BP (!) 177/72 (BP Location: Left Arm)   Pulse 72   Temp (!) 97.1 F (36.2 C) (Oral)   Resp 16   Ht 5\' 7"  (1.702 m)   Wt 45.9 kg   SpO2 95%   BMI 15.85 kg/m  SpO2: SpO2: 95 % O2 Device: O2 Device: Room Air O2 Flow Rate:        Palliative Assessment/Data: 20%   Palliative Care Assessment &  Plan   Patient Profile: 89 y.o. female  with past medical history of dementia, insulin-dependent DM type II, dermatomyositis (on Plaquenil and chronic prednisone 5 mg daily), peripheral vascular disease, hypertension, orthostatic hypotension, hypothyroidism, chronic wound of the bilateral toe, recurrent syncope and vasovagal syncope, seizure, hypothyroidism, and macrocytic anemia  admitted on 06/14/2023 with altered mental status.    Patient is admitted for acute metabolic encephalopathy due to acute CVA (MRI showed 1.8 cm acute to early subacute ischemic infarct involving right thalamocapsular region,  additional small acute to early subacute infarct in the inferior left cerebellum.)   Of note, patient has had 7 admissions in the past 6 months. She is followed by outpatient palliative care after a brief hospice evaluation in January 2024. PMT has been consulted to assist with goals of care conversation.  Assessment: Goals of care conversation Acute/subacute infarct in the right thalamocapsular region small/subacute infarct in the inferior left cerebellum  Acute metabolic encephalopathy  Failure to thrive  Recommendations/Plan: Continue DNR/DNI Continue supportive care and watchful waiting Patient's family understand her tenuous prognosis and possibility that she does not recover meaningfully Ongoing goals of care discussions pending clinical course Psychosocial and emotional support provided PMT will continue to follow and support   Prognosis: Guarded  Discharge Planning: To Be Determined  Care plan was discussed with Patient's daughters, MD   Total time: I spent 65 minutes in the care of the patient today in the above activities and documenting the encounter.  MDM: [facility]   [place] Palliative Medicine Team Team phone # [phone]  Thank you for allowing the Palliative Medicine Team to assist in the care of this patient. Please utilize secure chat with additional questions, if there is no response within 30 minutes please call the above phone number.  Palliative Medicine Team providers are available by phone from 7am to 7pm daily and can be reached through the team cell phone.  Should this patient require assistance outside of these hours, please call the patient's attending physician.  Portions of this note are a verbal dictation therefore any spelling and/or grammatical errors are due to the "[employer]agon [employer] One" system interpretation.

## 2023-06-17 NOTE — Progress Notes (Signed)
Initial Nutrition Assessment  DOCUMENTATION CODES:   Severe malnutrition in context of chronic illness  INTERVENTION:   -Continue dysphagia I diet with 1:1 feeding.  -Provide Ensure Enlive po BID, each supplement provides 350 kcal and 20 grams of protein.  -Provide Magic cup TID with meals, each supplement provides 290 kcal and 9 grams of protein.  -MVI/Minerals-1 Tab daily due for micronutrient support, repletion.   NUTRITION DIAGNOSIS:   Severe Malnutrition related to chronic illness, acute illness as evidenced by severe muscle depletion, moderate fat depletion, percent weight loss.    GOAL:   Patient will meet greater than or equal to 90% of their needs   MONITOR:   PO intake, Labs, Supplement acceptance, Weight trends, Skin  REASON FOR ASSESSMENT:   Consult Assessment of nutrition requirement/status  ASSESSMENT: 89 y/o female presented to the ED due to altered mental status. Patient was minimally responsive in the morning at presentation. Work up revealed acute to early subacute ischemic infarct involving the right thalamocapsular region, additional small acute to early subacute infarct in the inferior left cerebellum.  PMH: dementia, insulin dependent type II diabetes, dermatomyositis, PVD, HTN, orthostatic hypotension, hypothyroidism, chronic wound of bilateral toe, recurrent syncope and vasovagal syncope, seizure, macrocytic anemia, CKD, ascites, exploratory laparotomy and LOA.  No meal intakes recorded. Per RN, patient ate 50% of her L today. Patient is sleeping during visit, did not awaken to voice nor shoulder rub. She allowed nutrition focused physical exam. Per EMR review, weight fluctuations noted, 19% weight loss in 2.75 months.  Medications reviewed and include novolog SS 3x daily with meals and at bedtime, insulin glargine-yfgn 5 units daily, synthroid, potassium chloride 40 mEq Q4 hrs, prednisone.  Labs: CBG 77-435 past 24 hrs, hgb A1C 8.5%, potassium  3.2    NUTRITION - FOCUSED PHYSICAL EXAM:  Flowsheet Row Most Recent Value  Orbital Region No depletion  Upper Arm Region Moderate depletion  Thoracic and Lumbar Region Moderate depletion  Buccal Region No depletion  Temple Region Severe depletion  Clavicle Bone Region Severe depletion  Clavicle and Acromion Bone Region Severe depletion  Scapular Bone Region Unable to assess  [patient would not reposition]  Dorsal Hand Moderate depletion  Patellar Region Severe depletion  Anterior Thigh Region Severe depletion  Posterior Calf Region Severe depletion  Edema (RD Assessment) None  Hair Unable to assess  [she had a head wrap on tightly]  Eyes Reviewed  Mouth Unable to assess  [sleeping, would not awaken]  Skin Reviewed  Nails Reviewed       Diet Order:   Diet Order             DIET - DYS 1 Room service appropriate? Yes with Assist; Fluid consistency: Thin  Diet effective now                   EDUCATION NEEDS:   Not appropriate for education at this time  Skin:  Skin Assessment: Skin Integrity Issues: Skin Integrity Issues:: Stage II Stage II: mid sacrum Diabetic Ulcer: left medial foot Other: non-pressure wound on great toe and foot  Last BM:  06/16/23, type 7-medium  Height:   Ht Readings from Last 1 Encounters:  06/14/23 5\' 7"  (1.702 m)    Weight:   Wt Readings from Last 1 Encounters:  06/16/23 45.9 kg    Ideal Body Weight:  63.6 kg  BMI:  Body mass index is 15.85 kg/m.  Estimated Nutritional Needs:   Kcal:  1600-1900 kcal/day  Protein:  83-95 gm/day  Fluid:  1600-1900 mL/day    Alvino Chapel, RDLD Clinical Dietitian See AMION for contact information

## 2023-06-18 DIAGNOSIS — G9341 Metabolic encephalopathy: Secondary | ICD-10-CM | POA: Diagnosis not present

## 2023-06-18 LAB — GLUCOSE, CAPILLARY
Glucose-Capillary: 146 mg/dL — ABNORMAL HIGH (ref 70–99)
Glucose-Capillary: 251 mg/dL — ABNORMAL HIGH (ref 70–99)
Glucose-Capillary: 202 mg/dL — ABNORMAL HIGH (ref 70–99)
Glucose-Capillary: 111 mg/dL — ABNORMAL HIGH (ref 70–99)

## 2023-06-18 LAB — URINALYSIS, W/ REFLEX TO CULTURE (INFECTION SUSPECTED)
Bilirubin Urine: NEGATIVE
Glucose, UA: NEGATIVE mg/dL
Ketones, ur: NEGATIVE mg/dL
Nitrite: NEGATIVE
Protein, ur: 30 mg/dL — AB
Specific Gravity, Urine: 1.005 (ref 1.005–1.030)
WBC, UA: >50 WBC/hpf (ref 0–5)
pH: 8 (ref 5.0–8.0)

## 2023-06-18 LAB — BASIC METABOLIC PANEL
Anion gap: 9 (ref 5–15)
BUN: 12 mg/dL (ref 8–23)
CO2: 24 mmol/L (ref 22–32)
Calcium: 9.4 mg/dL (ref 8.9–10.3)
Chloride: 101 mmol/L (ref 98–111)
Creatinine, Ser: 0.78 mg/dL (ref 0.44–1.00)
GFR, Estimated: >60 mL/min (ref >60)
Glucose, Bld: 110 mg/dL — ABNORMAL HIGH (ref 70–99)
Potassium: 3.6 mmol/L (ref 3.5–5.1)
Sodium: 134 mmol/L — ABNORMAL LOW (ref 135–145)

## 2023-06-18 LAB — CBC
HCT: 28.6 % — ABNORMAL LOW (ref 36.0–46.0)
Hemoglobin: 9.3 g/dL — ABNORMAL LOW (ref 12.0–15.0)
MCH: 32.5 pg (ref 26.0–34.0)
MCHC: 32.5 g/dL (ref 30.0–36.0)
MCV: 100 fL (ref 80.0–100.0)
Platelets: 313 10*3/uL (ref 150–400)
RBC: 2.86 MIL/uL — ABNORMAL LOW (ref 3.87–5.11)
RDW: 14.1 % (ref 11.5–15.5)
WBC: 6.4 10*3/uL (ref 4.0–10.5)
nRBC: 0 % (ref 0.0–0.2)

## 2023-06-18 LAB — MAGNESIUM: Magnesium: 1.7 mg/dL (ref 1.7–2.4)

## 2023-06-18 MED ORDER — SODIUM CHLORIDE 0.9 % IV SOLN: INTRAVENOUS | Status: DC

## 2023-06-18 MED ORDER — BOOST / RESOURCE BREEZE PO LIQD CUSTOM
1.0000 | Freq: Three times a day (TID) | ORAL | Status: DC
  Administered 2023-06-18 – 2023-06-21 (×7): 1 via ORAL

## 2023-06-18 NOTE — Progress Notes (Signed)
     Referral received for [name] [name] for goals of care discussion. Chart reviewed after receiving message for callback call from patient's daughter [name] [name].   I was able to speak with [name] at the number provided. She had a question about whether patient's methotrexate would be covered/she would be allowed to take it if they decided to elect hospice. I reviewed the philosophy of hospice in promoting comfort and peace at the end of life. [name] [name] shares that in the past when she has stopped this medication she has developed a painful rash. I offered to reach out to hospice and pose a hypothetical answer in order to have a more firm answer when we plan to meet tomorrow morning.  [name] [name] shares that they have spoken to [facility] and they did not feel very comfortable with them but shared they understood [facility] could also be an option.I confirmed they do provide home hospice services in [facility]. She agreed for me to call their liaison and ask.  I called [name] Parcel, RN with [facility]. Sh states because it is not related to her hospice diagnosis that [facility] would not provice the medication. However, they could continue to fill it with her insurance as she ahs been and she would certainly be able to take it (as long as she can physically take the medication).  I will plan to follow-up with [name] tomorrow between 10:30 and 11:00 to further discuss.  Thank you for your referral and allowing PMT to assist in [name] [name] care.   Wynne Dust, NP Palliative Medicine Team Phone: [phone]  NO CHARGE

## 2023-06-18 NOTE — Inpatient Diabetes Management (Signed)
Inpatient Diabetes Program Recommendations  AACE/ADA: New Consensus Statement on Inpatient Glycemic Control   Target Ranges:  Prepandial:   less than 140 mg/dL      Peak postprandial:   less than 180 mg/dL (1-2 hours)      Critically ill patients:  140 - 180 mg/dL    Latest Reference Range & Units 06/17/23 07:41 06/17/23 11:01 06/17/23 17:54 06/17/23 21:06 06/18/23 07:57 06/18/23 11:55  Glucose-Capillary 70 - 99 mg/dL 960 (H) 454 (H) 098 (H) 268 (H) 146 (H) 251 (H)   Review of Glycemic Control  Diabetes history: DM Outpatient Diabetes medications: Levemir 7 units QAM, Levemir 3 units at bedtime, Novolog 2-10 units TID with meals Current orders for Inpatient glycemic control: Semglee 5 units daily, Novolog 0-9 units TID with meals, Novolog 0-5 units at bedtime; Prednisone 5 mg QAM  Inpatient Diabetes Program Recommendations:    Insulin: May want to consider increasing Novolog correction to 0-15 units TID with meals.  Thanks, Orlando Penner, RN, MSN, CDCES Diabetes Coordinator Inpatient Diabetes Program [phone] (Team Pager from 8am to 5pm)

## 2023-06-18 NOTE — Progress Notes (Signed)
MEWS Progress Note  Patient Details Name: [name] [name] MRN: [id number] DOB: [birth year] Today's Date: 06/18/2023   MEWS Flowsheet Documentation:  Assess: MEWS Score Temp: 97.7 F (36.5 C) BP: (!) 159/57 MAP (mmHg): 87 Pulse Rate: 60 ECG Heart Rate: 61 Resp: 12 Level of Consciousness: Responds to Voice SpO2: 99 % O2 Device: Room Air Patient Activity (if Appropriate): In bed Assess: MEWS Score MEWS Temp: 0 MEWS Systolic: 0 MEWS Pulse: 0 MEWS RR: 1 MEWS LOC: 1 MEWS Score: 2 MEWS Score Color: Yellow Assess: SIRS CRITERIA SIRS Temperature : 0 SIRS Respirations : 0 SIRS Pulse: 0 SIRS WBC: 0 SIRS Score Sum : 0 SIRS Temperature : 0 SIRS Pulse: 0 SIRS Respirations : 0 SIRS WBC: 0 SIRS Score Sum : 0 Assess: if the MEWS score is Yellow or Red Were vital signs accurate and taken at a resting state?: Yes Does the patient meet 2 or more of the SIRS criteria?: No MEWS guidelines implemented : Yes, yellow Treat MEWS Interventions: Considered administering scheduled or prn medications/treatments as ordered Take Vital Signs Increase Vital Sign Frequency : Yellow: Q2hr x1, continue Q4hrs until patient remains green for 12hrs Escalate MEWS: Escalate: Yellow: Discuss with charge nurse and consider notifying provider and/or RRT Notify: Charge Nurse/RN Name of Charge Nurse/RN Notified: [employer], rn   Scheduled meds given, will continue to monitor and recheck vitals q2h x 2   [name] [name] 06/18/2023, 9:00 AM

## 2023-06-18 NOTE — Progress Notes (Signed)
PROGRESS NOTE    [name] [name]  ZOX:[id number] DOB: 1934/05/15 DOA: 06/14/2023 PCP: [name], MD   Brief Narrative:  89 y.o. female with medical history significant for dementia, insulin-dependent T2DM, dermatomyositis (on Plaquenil and chronic prednisone 5 mg daily), PAD, history of syncope due to orthostasis and vasovagal episodes, history of seizure, hypothyroidism, macrocytic anemia, recent hospitalization in October 2024 for acute encephalopathy secondary to Klebsiella UTI treated with IV antibiotics followed by an admission from 05/18/2023-05/24/2023 for multifocal aspiration pneumonia treated with IV antibiotics along with acute metabolic encephalopathy presented with increased lethargy.  Workup revealed acute to early subacute ischemic infarct involving right thalamocapsular region, additional small acute to early subacute infarct in the inferior left cerebellum.  Neurology was consulted.  Palliative care was also consulted.  Assessment & Plan:   Acute/subacute infarct in the right thalamocapsular region, small/subacute infarct in the inferior left cerebellum Acute metabolic encephalopathy History of dementia Hyperlipidemia Dysphagia -Possibly due to advanced intracranial atherosclerosis -CTA head and neck showed advanced intracranial atherosclerosis with multifocal moderate pan stenosis. -2D echo showed EF of 60 to 60%, negative bubble study.  Lower extremity duplex did not show any DVT. -EEG showed mild diffuse encephalopathy.  No seizures -LDL 167.  A1c 8.5.  Continue ezetimibe.  Patient intolerant of statins per daughters -Neurology recommended aspirin and Plavix for 3 months then Plavix alone -Patient is mostly bedbound hence PT did not evaluate the patient -Diet as per SLP recommendations  Failure to thrive Poor oral intake Severe malnutrition -Oral intake remains poor.  Will put her on gentle hydration. -Palliative care following.  Follow recommendations -Follow  nutrition recommendations  Leukocytosis -Possible reactive.  Resolved  Anemia of chronic disease -From chronic illnesses per hemoglobin stable.  Monitor intermittently  Hyponatremia -Possibly from poor oral intake.  Sodium 134 today.  Monitor  Hypokalemia -Improved  Diabetes mellitus type 2 with hyperglycemia and hypoglycemia -Continue long-acting insulin.  CBGs with SSI  Dermatomyositis chronic debility with bed/wheelchair bound -Current home prednisone.  Resume Plaquenil and methotrexate on discharge.  Outpatient follow-up with rheumatology  History of seizures -Continue Keppra.  Outpatient follow-up with neurology  Hypertension -Monitor blood pressure.  Not on any antihypertensives at this time  Peripheral artery disease -Status post angioplasty of bilateral common iliac artery stenosis in 01/2023.  Continue aspirin, Plavix, Zetia.   Stage II mid sacrum pressure injury: Present on admission -Continue local wound care  DVT prophylaxis: Lovenox Code Status: DNR Family Communication: Daughter at bedside.   Disposition Plan: Status is: Inpatient Remains inpatient appropriate because: Of severity of illness.  Oral intake still extremely poor  Consultants: Neurology/palliative care  Procedures: As above  Antimicrobials: None  Subjective: Patient seen and examined at bedside.  Oral intake continues to remain poor as per report.  Fever, agitation, vomiting reported.  Daughter at bedside states that her mother did a little better last night but did not eat much this morning. Objective: Vitals:   06/17/23 1509 06/17/23 1948 06/17/23 2256 06/18/23 0340  BP: (!) 146/54 (!) 149/50 134/71 (!) 155/73  Pulse: 64 63 67 81  Resp: 14 20 18 14   Temp: (!) 97.4 F (36.3 C) (!) 97.5 F (36.4 C) 98 F (36.7 C) 98.1 F (36.7 C)  TempSrc: Axillary Oral Oral Oral  SpO2: 97% 99% 99% 97%  Weight:    48.2 kg  Height:        Intake/Output Summary (Last 24 hours) at 06/18/2023  0746 Last data filed at 06/18/2023 0419 Gross per  24 hour  Intake --  Output 400 ml  Net -400 ml   Filed Weights   06/15/23 0438 06/16/23 0342 06/18/23 0340  Weight: 46.1 kg 45.9 kg 48.2 kg    Examination:  General: No acute distress.  Remains on room air.  Chronically ill and deconditioned abdomen. ENT/neck: No obvious JVD elevation or palpable neck masses noted  respiratory: Bilateral decreased breath sounds at bases with scattered crackles CVS: S1-S2 heard, rate mostly controlled Abdominal: Soft, nontender, distended mildly; no organomegaly, normal bowel sounds heard  extremities: No clubbing; mild lower extremity edema CNS: Wakes up very slightly, hardly participates in any conversation  lymph: No palpable lymphadenopathy  skin: No obvious petechiae/rashes psych: Showing no signs of agitation.  Flat affect mostly.   Musculoskeletal: No obvious joint tenderness/erythema    Data Reviewed: I have personally reviewed following labs and imaging studies  CBC: Recent Labs  Lab 06/14/23 1730 06/14/23 1750 06/15/23 0330 06/16/23 0535 06/17/23 0447 06/18/23 0614  WBC 18.6*  --  10.4 7.5 6.3 6.4  NEUTROABS 17.0*  --   --   --   --   --   HGB 9.2* 9.5* 11.0* 8.5* 8.3* 9.3*  HCT 27.9* 28.0* 33.0* 25.3* 25.7* 28.6*  MCV 98.9  --  98.5 95.8 96.6 100.0  PLT 309  --  241 324 314 313   Basic Metabolic Panel: Recent Labs  Lab 06/14/23 1730 06/14/23 1750 06/15/23 0330 06/15/23 0607 06/15/23 2216 06/16/23 0535 06/16/23 0757 06/17/23 0447 06/18/23 0614  NA 128* 131* 133*   < > 128* 132* 129* 133* 134*  K 4.0 4.1 3.6  --   --  3.7  --  3.2* 3.6  CL 92* 91* 97*  --   --  98  --  103 101  CO2 29  --  29  --   --  23  --  26 24  GLUCOSE 210* 218* 141*  --   --  276* 462* 118* 110*  BUN 16 15 14   --   --  15  --  10 12  CREATININE 0.83 0.90 0.74  --   --  0.81  --  0.69 0.78  CALCIUM 9.8  --  9.7  --   --  9.5  --  9.2 9.4  MG  --   --   --   --   --   --   --  1.7 1.7   <  > = values in this interval not displayed.   GFR: Estimated Creatinine Clearance: 37.7 mL/min (by C-G formula based on SCr of 0.78 mg/dL). Liver Function Tests: Recent Labs  Lab 06/14/23 1730 06/15/23 0330 06/16/23 0535 06/17/23 0447  AST 26 24 21 20   ALT 39 34 28 24  ALKPHOS 125 109 101 96  BILITOT 0.4 0.7 0.6 0.5  PROT 6.9 6.6 5.8* 5.8*  ALBUMIN 3.2* 3.0* 2.7* 2.6*   No results for input(s): "LIPASE", "AMYLASE" in the last 168 hours. Recent Labs  Lab 06/14/23 1730  AMMONIA 20   Coagulation Profile: Recent Labs  Lab 06/14/23 1730  INR 1.1   Cardiac Enzymes: No results for input(s): "CKTOTAL", "CKMB", "CKMBINDEX", "TROPONINI" in the last 168 hours. BNP (last 3 results) No results for input(s): "PROBNP" in the last 8760 hours. HbA1C: No results for input(s): "HGBA1C" in the last 72 hours.  CBG: Recent Labs  Lab 06/16/23 2053 06/17/23 0741 06/17/23 1101 06/17/23 1754 06/17/23 2106  GLUCAP 77 230* 295* 290* 268*  Lipid Profile: No results for input(s): "CHOL", "HDL", "LDLCALC", "TRIG", "CHOLHDL", "LDLDIRECT" in the last 72 hours.  Thyroid Function Tests: No results for input(s): "TSH", "T4TOTAL", "FREET4", "T3FREE", "THYROIDAB" in the last 72 hours. Anemia Panel: No results for input(s): "VITAMINB12", "FOLATE", "FERRITIN", "TIBC", "IRON", "RETICCTPCT" in the last 72 hours.  Sepsis Labs: No results for input(s): "PROCALCITON", "LATICACIDVEN" in the last 168 hours.  Recent Results (from the past 240 hour(s))  Blood culture (routine x 2)     Status: None (Preliminary result)   Collection Time: 06/14/23  8:20 PM   Specimen: BLOOD  Result Value Ref Range Status   Specimen Description   Final    BLOOD LEFT ANTECUBITAL Performed at [facility], [address], [place], [place] [address]    Special Requests   Final    BOTTLES DRAWN AEROBIC AND ANAEROBIC Blood Culture adequate volume Performed at [facility], [address], [place], [place] [address]    Culture   Final    NO GROWTH 3 DAYS Performed at [facility], [address], [place], [place] [address]    Report Status PENDING  Incomplete  Culture, blood (Routine X 2) w Reflex to ID Panel     Status: None (Preliminary result)   Collection Time: 06/15/23  3:30 AM   Specimen: BLOOD  Result Value Ref Range Status   Specimen Description   Final    BLOOD BLOOD RIGHT FOREARM Performed at [facility], [address] [place], [place] [address]    Special Requests   Final    BOTTLES DRAWN AEROBIC AND ANAEROBIC Blood Culture adequate volume Performed at [facility], [address], [place], [place] [address]    Culture   Final    NO GROWTH 2 DAYS Performed at [facility], [address], [place], [place] [address]    Report Status PENDING  Incomplete  Culture, blood (Routine X 2) w Reflex to ID Panel     Status: None (Preliminary result)   Collection Time: 06/15/23  6:07 AM   Specimen: BLOOD  Result Value Ref Range Status   Specimen Description BLOOD BLOOD RIGHT HAND  Final   Special Requests   Final    BOTTLES DRAWN AEROBIC AND ANAEROBIC Blood Culture adequate volume   Culture   Final    NO GROWTH 2 DAYS Performed at [facility], [address], [place], [place] [address]    Report Status PENDING  Incomplete         Radiology Studies: No results found.      Scheduled Meds:  aspirin EC  81 mg Oral q AM   clopidogrel  75 mg Oral Q breakfast   enoxaparin (LOVENOX) injection  40 mg Subcutaneous Q24H   ezetimibe  10 mg Oral QHS   feeding supplement  237 mL Oral BID BM   insulin aspart  0-5 Units Subcutaneous QHS   insulin aspart  0-9 Units Subcutaneous TID WC   insulin glargine-yfgn  5 Units Subcutaneous Daily   levETIRAcetam  500 mg Oral BID   levothyroxine  100 mcg Oral Q0600   multivitamin with minerals  1 tablet Oral Daily   predniSONE  5 mg Oral Q breakfast    sodium chloride flush  3 mL Intravenous Q12H   sodium chloride flush  3 mL Intravenous Q12H   Continuous Infusions:          Glade Lloyd, MD [facility] 06/18/2023, 7:46 AM

## 2023-06-18 NOTE — Progress Notes (Signed)
Speech Language Pathology Treatment: Dysphagia  Patient Details Name: [name] [name] MRN: [id number] DOB: 1933/08/01 Today's Date: 06/18/2023 Time: 1226-1300 SLP Time Calculation (min) (ACUTE ONLY): 34 min  Assessment / Plan / Recommendation Clinical Impression  Pt's mentation continues to fluctuate. She was awake upon arrival. Daughter, [name] [name], at the bedside, carefully feeding her mother.  [name] [name] was alert, smiling. She demonstrated adequate attention needed to anticipate arrival of spoon to lips, removed purees, and swallowed without overt difficulty. She accepted sips of thin liquid from a straw with no coughing. [name] [name] discussed plans for home with potential increased help, hospice support, and the possibility of residential hospice should the necessity arise. We talked about the benefits of hospice overall. We reviewed the trajectory of swallowing difficulty in light of the new CVA overlaid on an already tenuous swallow. [name] [name] continues to have excellent insight into her mom's situation and is realistic about the future.  Provided support.  SLP will follow.   HPI HPI: [name] [name] is an 89 y.o. female, known to SLP services, who presented on 11/25 with AMS and dx of acute CVA. MRI: 1.8 cm acute to early subacute ischemic infarct involving right thalamocapsular region, small acute to early subacute infarct in the inferior left cerebellum. Recent admission 10/29-11/4 for multifocal pna, concern for aspiration. Hx of pharyngo-cervical esophageal dysphagia, prior MBS 07/29/22. Most recent  MBS 10/31: deteriorating swallow function with silent aspiration of thin/nectar thick liquids, penetration of honey-thick liquids and puree, poor propulsion through pharynx, significant residue. Aspiration of residue after the swallow. Pt has been followed by Palliative Medicine and home hospice. During last admission, swallowing was discussed at length with pt's daughters. They have been caring for their  mother at home and have an excellent understanding of their mother's swallowing issues, the concerns around progressing dysphagia and the adverse impact of aspiration.  A decision was made to allow regular solids/thin liquids so that family could select foods that their mother could manage and that she would enjoy. PMH significant for  insulin-dependent T2DM, dermatomyositis, PAD, history of syncope due to orthostasis and vasovagal episodes, seizure, hypothyroidism, and macrocytic anemia.      SLP Plan  Continue with current plan of care      Recommendations for follow up therapy are one component of a multi-disciplinary discharge planning process, led by the attending physician.  Recommendations may be updated based on patient status, additional functional criteria and insurance authorization.    Recommendations  Diet recommendations: Dysphagia 1 (puree);Thin liquid Liquids provided via: Straw Medication Administration: Crushed with puree Supervision: Trained caregiver to feed patient Compensations: Minimize environmental distractions;Small sips/bites;Slow rate Postural Changes and/or Swallow Maneuvers: Seated upright 90 degrees                  Oral care BID   Frequent or constant Supervision/Assistance Dysphagia, oropharyngeal phase (R13.12)     Continue with current plan of care    [name] [name] L. [name] Frederic, MA CCC/SLP Clinical Specialist - Acute Care SLP Acute Rehabilitation Services Office number [phone]  [name] [name] [name] [name]  06/18/2023, 1:06 PM

## 2023-06-19 DIAGNOSIS — G9341 Metabolic encephalopathy: Secondary | ICD-10-CM | POA: Diagnosis not present

## 2023-06-19 DIAGNOSIS — Z66 Do not resuscitate: Secondary | ICD-10-CM

## 2023-06-19 LAB — CBC
HCT: 27.9 % — ABNORMAL LOW (ref 36.0–46.0)
Hemoglobin: 9 g/dL — ABNORMAL LOW (ref 12.0–15.0)
MCH: 31.5 pg (ref 26.0–34.0)
MCHC: 32.3 g/dL (ref 30.0–36.0)
MCV: 97.6 fL (ref 80.0–100.0)
Platelets: 327 10*3/uL (ref 150–400)
RBC: 2.86 MIL/uL — ABNORMAL LOW (ref 3.87–5.11)
RDW: 14.1 % (ref 11.5–15.5)
WBC: 5.4 10*3/uL (ref 4.0–10.5)
nRBC: 0 % (ref 0.0–0.2)

## 2023-06-19 LAB — CULTURE, BLOOD (ROUTINE X 2)
Culture: NO GROWTH
Special Requests: ADEQUATE

## 2023-06-19 LAB — GLUCOSE, CAPILLARY
Glucose-Capillary: 125 mg/dL — ABNORMAL HIGH (ref 70–99)
Glucose-Capillary: 176 mg/dL — ABNORMAL HIGH (ref 70–99)
Glucose-Capillary: 120 mg/dL — ABNORMAL HIGH (ref 70–99)
Glucose-Capillary: 154 mg/dL — ABNORMAL HIGH (ref 70–99)

## 2023-06-19 LAB — BASIC METABOLIC PANEL
Anion gap: 5 (ref 5–15)
BUN: 8 mg/dL (ref 8–23)
CO2: 26 mmol/L (ref 22–32)
Calcium: 9.3 mg/dL (ref 8.9–10.3)
Chloride: 105 mmol/L (ref 98–111)
Creatinine, Ser: 0.6 mg/dL (ref 0.44–1.00)
GFR, Estimated: >60 mL/min (ref >60)
Glucose, Bld: 84 mg/dL (ref 70–99)
Potassium: 3.2 mmol/L — ABNORMAL LOW (ref 3.5–5.1)
Sodium: 136 mmol/L (ref 135–145)

## 2023-06-19 LAB — MAGNESIUM: Magnesium: 1.8 mg/dL (ref 1.7–2.4)

## 2023-06-19 MED ORDER — SODIUM CHLORIDE 0.9 % IV SOLN
1.0000 g | INTRAVENOUS | Status: DC
  Administered 2023-06-19 – 2023-06-21 (×3): 1 g via INTRAVENOUS
  Filled 2023-06-19 (×3): qty 10

## 2023-06-19 MED ORDER — POTASSIUM CHLORIDE 20 MEQ PO PACK
40.0000 meq | PACK | ORAL | Status: AC
Start: 2023-06-19 — End: 2023-06-19
  Administered 2023-06-19: 40 meq via ORAL
  Filled 2023-06-19 (×2): qty 2

## 2023-06-19 MED ORDER — POTASSIUM CHLORIDE 10 MEQ/100ML IV SOLN
10.0000 meq | INTRAVENOUS | Status: AC
Start: 2023-06-19 — End: 2023-06-19
  Administered 2023-06-19 (×4): 10 meq via INTRAVENOUS
  Filled 2023-06-19 (×4): qty 100

## 2023-06-19 MED ORDER — POTASSIUM CHLORIDE CRYS ER 20 MEQ PO TBCR: 40.0000 meq | EXTENDED_RELEASE_TABLET | ORAL | Status: DC

## 2023-06-19 NOTE — Progress Notes (Signed)
PROGRESS NOTE    SWEDEN HEINSOHN  ZOX:[id number] DOB: Apr 26, 1934 DOA: 06/14/2023 PCP: [name], MD   Brief Narrative:  89 y.o. female with medical history significant for dementia, insulin-dependent T2DM, dermatomyositis (on Plaquenil and chronic prednisone 5 mg daily), PAD, history of syncope due to orthostasis and vasovagal episodes, history of seizure, hypothyroidism, macrocytic anemia, recent hospitalization in October 2024 for acute encephalopathy secondary to Klebsiella UTI treated with IV antibiotics followed by an admission from 05/18/2023-05/24/2023 for multifocal aspiration pneumonia treated with IV antibiotics along with acute metabolic encephalopathy presented with increased lethargy.  Workup revealed acute to early subacute ischemic infarct involving right thalamocapsular region, additional small acute to early subacute infarct in the inferior left cerebellum.  Neurology was consulted.  Palliative care was also consulted.  Assessment & Plan:   Acute/subacute infarct in the right thalamocapsular region, small/subacute infarct in the inferior left cerebellum Acute metabolic encephalopathy History of dementia Hyperlipidemia Dysphagia -Possibly due to advanced intracranial atherosclerosis -CTA head and neck showed advanced intracranial atherosclerosis with multifocal moderate pan stenosis. -2D echo showed EF of 60 to 60%, negative bubble study.  Lower extremity duplex did not show any DVT. -EEG showed mild diffuse encephalopathy.  No seizures -LDL 167.  A1c 8.5.  Continue ezetimibe.  Patient intolerant of statins per daughters -Neurology recommended aspirin and Plavix for 3 months then Plavix alone -Patient is mostly bedbound hence PT did not evaluate the patient -Diet as per SLP recommendations  Failure to thrive Poor oral intake Severe malnutrition -Oral intake remains is only slightly better.  If does not improve, consider hospice.  Continue gentle  hydration. -Palliative care following.  Follow recommendations -Follow nutrition recommendations  Probable UTI -Start Rocephin.  Follow cultures  Leukocytosis -Possible reactive.  Resolved  Anemia of chronic disease -From chronic illnesses.  Hemoglobin stable currently.  Monitor intermittently  Hyponatremia -Possibly from poor oral intake.  Sodium 136 today.  Monitor  Hypokalemia -Replace.  Repeat a.m. labs  Diabetes mellitus type 2 with hyperglycemia and hypoglycemia -Continue long-acting insulin.  CBGs with SSI  Dermatomyositis chronic debility with bed/wheelchair bound -Continue home prednisone.  Resume Plaquenil and methotrexate on discharge.  Outpatient follow-up with rheumatology  History of seizures -Continue Keppra.  Outpatient follow-up with neurology  Hypertension -Monitor blood pressure.  Not on any antihypertensives at this time  Peripheral artery disease -Status post angioplasty of bilateral common iliac artery stenosis in 01/2023.  Continue aspirin, Plavix, Zetia.   Stage II mid sacrum pressure injury: Present on admission -Continue local wound care  DVT prophylaxis: Lovenox Code Status: DNR Family Communication: Daughter at bedside.   Disposition Plan: Status is: Inpatient Remains inpatient appropriate because: Of severity of illness.  Oral intake still extremely poor  Consultants: Neurology/palliative care  Procedures: As above  Antimicrobials: None  Subjective: Patient seen and examined at bedside.  No agitation, fever, vomiting reported.  Urine was foul-smelling yesterday as per nursing staff report.  Oral intake is only slightly better as per daughter at bedside.   Objective: Vitals:   06/18/23 1935 06/18/23 2318 06/19/23 0350 06/19/23 0500  BP: 134/66 (!) 150/66 (!) 161/78   Pulse:      Resp:      Temp: (!) 97.4 F (36.3 C) (!) 97.5 F (36.4 C) 97.8 F (36.6 C)   TempSrc: Oral Oral Axillary   SpO2:      Weight:    48.2 kg  Height:         Intake/Output Summary (Last 24 hours) at  06/19/2023 0733 Last data filed at 06/19/2023 0400 Gross per 24 hour  Intake 1547.9 ml  Output 500 ml  Net 1047.9 ml   Filed Weights   06/16/23 0342 06/18/23 0340 06/19/23 0500  Weight: 45.9 kg 48.2 kg 48.2 kg    Examination:  General: On room air currently.  No distress.  Chronically ill and deconditioned looking. ENT/neck: No obvious thyromegaly or elevated JVD noted  respiratory: Decreased breath sounds at bases bilaterally with some crackles  CVS: Rate currently controlled; S1 and S2 are heard  abdominal: Soft, nontender, remains slightly distended; no organomegaly, bowel sounds are heard  extremities: Trace lower extremity edema present; no cyanosis  CNS: Awake, still does not participate in conversation much.   Lymph: No palpable cervical lymphadenopathy  skin: No obvious lesions/ecchymosis psych: Affect is flat.  Not agitated currently. Musculoskeletal: No obvious joint swelling/tenderness   Data Reviewed: I have personally reviewed following labs and imaging studies  CBC: Recent Labs  Lab 06/14/23 1730 06/14/23 1750 06/15/23 0330 06/16/23 0535 06/17/23 0447 06/18/23 0614 06/19/23 0413  WBC 18.6*  --  10.4 7.5 6.3 6.4 5.4  NEUTROABS 17.0*  --   --   --   --   --   --   HGB 9.2*   < > 11.0* 8.5* 8.3* 9.3* 9.0*  HCT 27.9*   < > 33.0* 25.3* 25.7* 28.6* 27.9*  MCV 98.9  --  98.5 95.8 96.6 100.0 97.6  PLT 309  --  241 324 314 313 327   < > = values in this interval not displayed.   Basic Metabolic Panel: Recent Labs  Lab 06/15/23 0330 06/15/23 0607 06/16/23 0535 06/16/23 0757 06/17/23 0447 06/18/23 0614 06/19/23 0413  NA 133*   < > 132* 129* 133* 134* 136  K 3.6  --  3.7  --  3.2* 3.6 3.2*  CL 97*  --  98  --  103 101 105  CO2 29  --  23  --  26 24 26   GLUCOSE 141*  --  276* 462* 118* 110* 84  BUN 14  --  15  --  10 12 8   CREATININE 0.74  --  0.81  --  0.69 0.78 0.60  CALCIUM 9.7  --  9.5  --  9.2 9.4  9.3  MG  --   --   --   --  1.7 1.7 1.8   < > = values in this interval not displayed.   GFR: Estimated Creatinine Clearance: 37.7 mL/min (by C-G formula based on SCr of 0.6 mg/dL). Liver Function Tests: Recent Labs  Lab 06/14/23 1730 06/15/23 0330 06/16/23 0535 06/17/23 0447  AST 26 24 21 20   ALT 39 34 28 24  ALKPHOS 125 109 101 96  BILITOT 0.4 0.7 0.6 0.5  PROT 6.9 6.6 5.8* 5.8*  ALBUMIN 3.2* 3.0* 2.7* 2.6*   No results for input(s): "LIPASE", "AMYLASE" in the last 168 hours. Recent Labs  Lab 06/14/23 1730  AMMONIA 20   Coagulation Profile: Recent Labs  Lab 06/14/23 1730  INR 1.1   Cardiac Enzymes: No results for input(s): "CKTOTAL", "CKMB", "CKMBINDEX", "TROPONINI" in the last 168 hours. BNP (last 3 results) No results for input(s): "PROBNP" in the last 8760 hours. HbA1C: No results for input(s): "HGBA1C" in the last 72 hours.  CBG: Recent Labs  Lab 06/17/23 2106 06/18/23 0757 06/18/23 1155 06/18/23 1612 06/18/23 2108  GLUCAP 268* 146* 251* 202* 111*   Lipid Profile: No results for input(s): "  CHOL", "HDL", "LDLCALC", "TRIG", "CHOLHDL", "LDLDIRECT" in the last 72 hours.  Thyroid Function Tests: No results for input(s): "TSH", "T4TOTAL", "FREET4", "T3FREE", "THYROIDAB" in the last 72 hours. Anemia Panel: No results for input(s): "VITAMINB12", "FOLATE", "FERRITIN", "TIBC", "IRON", "RETICCTPCT" in the last 72 hours.  Sepsis Labs: No results for input(s): "PROCALCITON", "LATICACIDVEN" in the last 168 hours.  Recent Results (from the past 240 hour(s))  Blood culture (routine x 2)     Status: None   Collection Time: 06/14/23  8:20 PM   Specimen: BLOOD  Result Value Ref Range Status   Specimen Description   Final    BLOOD LEFT ANTECUBITAL Performed at [facility], [address] [place], [place] [address]    Special Requests   Final    BOTTLES DRAWN AEROBIC AND ANAEROBIC Blood Culture adequate volume Performed at [facility], [address] [place], [place] [address]    Culture   Final    NO GROWTH 5 DAYS Performed at [facility], [address], [place], [place] [address]    Report Status 06/19/2023 FINAL  Final  Culture, blood (Routine X 2) w Reflex to ID Panel     Status: None (Preliminary result)   Collection Time: 06/15/23  3:30 AM   Specimen: BLOOD  Result Value Ref Range Status   Specimen Description   Final    BLOOD BLOOD RIGHT FOREARM Performed at [facility], [address], [place], [place] [address]    Special Requests   Final    BOTTLES DRAWN AEROBIC AND ANAEROBIC Blood Culture adequate volume Performed at [facility], [address] [place], [place] [address]    Culture   Final    NO GROWTH 4 DAYS Performed at [facility], [address], [place], [place] [address]    Report Status PENDING  Incomplete  Culture, blood (Routine X 2) w Reflex to ID Panel     Status: None (Preliminary result)   Collection Time: 06/15/23  6:07 AM   Specimen: BLOOD  Result Value Ref Range Status   Specimen Description BLOOD BLOOD RIGHT HAND  Final   Special Requests   Final    BOTTLES DRAWN AEROBIC AND ANAEROBIC Blood Culture adequate volume   Culture   Final    NO GROWTH 4 DAYS Performed at [facility], [address] [place], [place] [address]    Report Status PENDING  Incomplete         Radiology Studies: No results found.      Scheduled Meds:  aspirin EC  81 mg Oral q AM   clopidogrel  75 mg Oral Q breakfast   enoxaparin (LOVENOX) injection  40 mg Subcutaneous Q24H   ezetimibe  10 mg Oral QHS   feeding supplement  1 Container Oral TID BM   insulin aspart  0-5 Units Subcutaneous QHS   insulin aspart  0-9 Units Subcutaneous TID WC   insulin glargine-yfgn  5 Units Subcutaneous Daily   levETIRAcetam  500 mg Oral BID   levothyroxine  100 mcg Oral Q0600   multivitamin with minerals  1 tablet Oral Daily    predniSONE  5 mg Oral Q breakfast   sodium chloride flush  3 mL Intravenous Q12H   sodium chloride flush  3 mL Intravenous Q12H   Continuous Infusions:  sodium chloride 50 mL/hr at 06/18/23 1800           Marylou Wages [name],  MD [facility] 06/19/2023, 7:33 AM

## 2023-06-19 NOTE — TOC Transition Note (Signed)
Transition of Care [facility]) - CM/SW Discharge Note   Patient Details  Name: [name] [name] MRN: [id number] Date of Birth: [birth year]  Transition of Care [facility] CM/SW Contact:  Ronny Bacon, RN Phone Number: 06/19/2023, 12:57 PM   Clinical Narrative:   Secure message received from provider that patient will go home with hospice. Family requested [facility] and provider has already spoken to the liaison.       Barriers to Discharge: Continued Medical Work up   Patient Goals and CMS Choice      Discharge Placement                         Discharge Plan and Services Additional resources added to the After Visit Summary for       Post Acute Care Choice: Home Health, Resumption of Svcs/PTA Provider                               Social Determinants of Health (SDOH) Interventions SDOH Screenings   Food Insecurity: No Food Insecurity (06/15/2023)  Housing: Low Risk  (06/15/2023)  Transportation Needs: No Transportation Needs (06/15/2023)  Utilities: Not At Risk (06/15/2023)  Social Connections: Unknown (12/02/2021)   [facility] from [facility]  Tobacco Use: Medium Risk (06/14/2023)     Readmission Risk Interventions    05/24/2023   12:19 PM 03/17/2023    2:50 PM 02/16/2023   11:04 AM  Readmission Risk Prevention Plan  Transportation Screening Complete Complete Complete  Medication Review Oceanographer) Complete Complete   PCP or Specialist appointment within 3-5 days of discharge Complete Complete   HRI or Home Care Consult Complete Complete Complete  SW Recovery Care/Counseling Consult Complete Complete Complete  Palliative Care Screening Complete Not Applicable   Skilled Nursing Facility Not Applicable Patient Refused

## 2023-06-19 NOTE — Progress Notes (Addendum)
Daily Progress Note   Patient Name: [name] [name]       Date: 06/19/2023 DOB: 04/16/1934  Age: 89 y.o. MRN#: [id number] Attending Physician: Glade Lloyd, MD Primary Care Physician: [name], MD Admit Date: 06/14/2023 Length of Stay: 5 days  Reason for Consultation/Follow-up: Establishing goals of care  HPI/Patient Profile:  89 y.o. female  with past medical history of dementia, insulin-dependent DM type II, dermatomyositis (on Plaquenil and chronic prednisone 5 mg daily), peripheral vascular disease, hypertension, orthostatic hypotension, hypothyroidism, chronic wound of the bilateral toe, recurrent syncope and vasovagal syncope, seizure, hypothyroidism, and macrocytic anemia  admitted on 06/14/2023 with altered mental status.    Patient is admitted for acute metabolic encephalopathy due to acute CVA (MRI showed 1.8 cm acute to early subacute ischemic infarct involving right thalamocapsular region, additional small acute to early subacute infarct in the inferior left cerebellum.)   Of note, patient has had 7 admissions in the past 6 months. She is followed by outpatient palliative care after a brief hospice evaluation in January 2024. PMT has been consulted to assist with goals of care conversation.  Subjective:   Subjective: Chart Reviewed. Updates received. Patient Assessed. Created space and opportunity for patient  and family to explore thoughts and feelings regarding current medical situation.  Today's Discussion: Today saw the patient at bedside.  Also present was daughter [name] [name] in person and daughter [name] [name] via telephone.  We discussed the patient's situation, we reviewed her clinical state, history, and illnesses including multiple admissions leading up to this.  They state that they previously engaged with hospice with [facility] and did not have a good experience.  At that time the patient was more functional and they felt that they were just pushing her to stop all  medications and passed away.  However, at this time they note that she has definitely declined over time.  They are interested in engaging with hospice to explore options, although they are hesitant to commit.  One of the big concerns is ability to continue methotrexate for dermatomyositis which has been a major pathology in her life.  I described hospice as a service for patients who have a life expectancy of 6 months or less. The goal of hospice is the preservation of dignity and quality at the end phases of life. Under hospice care, the focus changes from curative to symptom relief. I explained the three setting where hospice services can be provided including the home, at a living facility (such as LTC SNF, Assisted Living, etc), and a hospice facility. I explained that acceptance to hospice in any specific location is the final decision of the hospice medical director and bed availability, if applicable. They verbalized understanding.  I offered choice and described agencies available in this area.  They have requested a referral to [facility] [place] for evaluation and for questions.  They note previous mistrust with [facility] and resulting caution in proceeding.  I told him that I would send a TOC consult and palliative medicine will continue to follow and support their discussions as they help decide how to best care for their mother.  If they do elect hospice they would be interested in home hospice.  I provided emotional and general support through therapeutic listening, empathy, sharing of stories, therapeutic touch, and other techniques. I answered all questions and addressed all concerns to the best of my ability.  Review of Systems  Unable to perform ROS: Acuity of condition    Objective:  Vital Signs:  BP (!) 165/68 (BP Location: Left Arm)   Pulse 64   Temp 98.1 F (36.7 C) (Oral)   Resp 16   Ht 5\' 7"  (1.702 m)   Wt 48.2 kg   SpO2 99%   BMI 16.64 kg/m   Physical  Exam Vitals and nursing note reviewed.  Constitutional:      General: She is sleeping. She is not in acute distress.    Appearance: She is ill-appearing.  Cardiovascular:     Rate and Rhythm: Normal rate.  Pulmonary:     Effort: Pulmonary effort is normal. No respiratory distress.  Abdominal:     General: Abdomen is flat.     Palliative Assessment/Data: 10-20%    Existing Vynca/ACP Documentation: DNR effective 04/03/2022  Assessment & Plan:   Impression: Present on Admission:  Acute metabolic encephalopathy  Hyponatremia  Leukocytosis  Dermatomyositis (HCC)  PVD (peripheral vascular disease) (HCC)  Essential hypertension  Hypothyroidism  (Resolved) Pressure ulcer-bilateral toe.  (Resolved) AKI (acute kidney injury) (HCC)  SUMMARY OF RECOMMENDATIONS   Remain DNR-limited TOC consult for referral to [facility] for evaluation for home hospice services I have reached out to hospice of the [facility] liaison for notification Continue to emotional support of patient and family Palliative medicine is available tomorrow for any urgent or emergent needs I will follow-up with the patient and family on Monday  Symptom Management:  Per primary team PMT is available to assist as needed  Code Status: DNR-limited  Prognosis: < 6 months  Discharge Planning: To Be Determined  Discussed with: Patient's family, medical team, nursing team, [facility] team, hospice liaison  Thank you for allowing [place] to participate in the care of [name] [name] PMT will continue to support holistically.  Time Total: 60 min  Detailed review of medical records (labs, imaging, vital signs), medically appropriate exam, discussed with treatment team, counseling and education to patient, family, & staff, documenting clinical information, medication management, coordination of care  Wynne Dust, NP Palliative Medicine Team  Team Phone # [phone] (Nights/Weekends)  03/18/2021, 8:17 AM

## 2023-06-19 NOTE — Plan of Care (Signed)
  Problem: Education: Goal: Knowledge of General Education information will improve Description: Including pain rating scale, medication(s)/side effects and non-pharmacologic comfort measures Outcome: Progressing   Problem: Health Behavior/Discharge Planning: Goal: Ability to manage health-related needs will improve Outcome: Progressing   Problem: Clinical Measurements: Goal: Ability to maintain clinical measurements within normal limits will improve Outcome: Progressing Goal: Will remain free from infection Outcome: Progressing Goal: Diagnostic test results will improve Outcome: Progressing Goal: Respiratory complications will improve Outcome: Progressing Goal: Cardiovascular complication will be avoided Outcome: Progressing   Problem: Activity: Goal: Risk for activity intolerance will decrease Outcome: Progressing   Problem: Nutrition: Goal: Adequate nutrition will be maintained Outcome: Progressing   Problem: Coping: Goal: Level of anxiety will decrease Outcome: Progressing   Problem: Elimination: Goal: Will not experience complications related to bowel motility Outcome: Progressing Goal: Will not experience complications related to urinary retention Outcome: Progressing   Problem: Pain Management: Goal: General experience of comfort will improve Outcome: Progressing   Problem: Safety: Goal: Ability to remain free from injury will improve Outcome: Progressing   Problem: Skin Integrity: Goal: Risk for impaired skin integrity will decrease Outcome: Progressing   Problem: Education: Goal: Ability to describe self-care measures that may prevent or decrease complications (Diabetes Survival Skills Education) will improve Outcome: Progressing Goal: Individualized Educational Video(s) Outcome: Progressing   Problem: Coping: Goal: Ability to adjust to condition or change in health will improve Outcome: Progressing   Problem: Fluid Volume: Goal: Ability to  maintain a balanced intake and output will improve Outcome: Progressing   Problem: Health Behavior/Discharge Planning: Goal: Ability to identify and utilize available resources and services will improve Outcome: Progressing Goal: Ability to manage health-related needs will improve Outcome: Progressing   Problem: Metabolic: Goal: Ability to maintain appropriate glucose levels will improve Outcome: Progressing   Problem: Nutritional: Goal: Maintenance of adequate nutrition will improve Outcome: Progressing Goal: Progress toward achieving an optimal weight will improve Outcome: Progressing   Problem: Skin Integrity: Goal: Risk for impaired skin integrity will decrease Outcome: Progressing   Problem: Tissue Perfusion: Goal: Adequacy of tissue perfusion will improve Outcome: Progressing   Problem: Education: Goal: Knowledge of disease or condition will improve Outcome: Progressing Goal: Knowledge of secondary prevention will improve (MUST DOCUMENT ALL) Outcome: Progressing Goal: Knowledge of patient specific risk factors will improve Loraine Leriche N/A or DELETE if not current risk factor) Outcome: Progressing   Problem: Ischemic Stroke/TIA Tissue Perfusion: Goal: Complications of ischemic stroke/TIA will be minimized Outcome: Progressing   Problem: Coping: Goal: Will verbalize positive feelings about self Outcome: Progressing Goal: Will identify appropriate support needs Outcome: Progressing   Problem: Health Behavior/Discharge Planning: Goal: Ability to manage health-related needs will improve Outcome: Progressing Goal: Goals will be collaboratively established with patient/family Outcome: Progressing   Problem: Self-Care: Goal: Ability to participate in self-care as condition permits will improve Outcome: Progressing Goal: Verbalization of feelings and concerns over difficulty with self-care will improve Outcome: Progressing Goal: Ability to communicate needs accurately  will improve Outcome: Progressing   Problem: Nutrition: Goal: Risk of aspiration will decrease Outcome: Progressing Goal: Dietary intake will improve Outcome: Progressing

## 2023-06-20 DIAGNOSIS — G9341 Metabolic encephalopathy: Secondary | ICD-10-CM | POA: Diagnosis not present

## 2023-06-20 LAB — CULTURE, BLOOD (ROUTINE X 2)
Culture: NO GROWTH
Culture: NO GROWTH
Special Requests: ADEQUATE
Special Requests: ADEQUATE

## 2023-06-20 LAB — CBC WITH DIFFERENTIAL/PLATELET
Abs Immature Granulocytes: 0.04 10*3/uL (ref 0.00–0.07)
Basophils Absolute: 0 10*3/uL (ref 0.0–0.1)
Basophils Relative: 0 %
Eosinophils Absolute: 0.2 10*3/uL (ref 0.0–0.5)
Eosinophils Relative: 4 %
HCT: 27.9 % — ABNORMAL LOW (ref 36.0–46.0)
Hemoglobin: 9.4 g/dL — ABNORMAL LOW (ref 12.0–15.0)
Immature Granulocytes: 1 %
Lymphocytes Relative: 15 %
Lymphs Abs: 0.8 10*3/uL (ref 0.7–4.0)
MCH: 33 pg (ref 26.0–34.0)
MCHC: 33.7 g/dL (ref 30.0–36.0)
MCV: 97.9 fL (ref 80.0–100.0)
Monocytes Absolute: 0.6 10*3/uL (ref 0.1–1.0)
Monocytes Relative: 10 %
Neutro Abs: 4 10*3/uL (ref 1.7–7.7)
Neutrophils Relative %: 70 %
Platelets: 324 10*3/uL (ref 150–400)
RBC: 2.85 MIL/uL — ABNORMAL LOW (ref 3.87–5.11)
RDW: 13.9 % (ref 11.5–15.5)
WBC: 5.7 10*3/uL (ref 4.0–10.5)
nRBC: 0 % (ref 0.0–0.2)

## 2023-06-20 LAB — BASIC METABOLIC PANEL
Anion gap: 10 (ref 5–15)
BUN: 6 mg/dL — ABNORMAL LOW (ref 8–23)
CO2: 24 mmol/L (ref 22–32)
Calcium: 9.3 mg/dL (ref 8.9–10.3)
Chloride: 97 mmol/L — ABNORMAL LOW (ref 98–111)
Creatinine, Ser: 0.9 mg/dL (ref 0.44–1.00)
GFR, Estimated: >60 mL/min (ref >60)
Glucose, Bld: 419 mg/dL — ABNORMAL HIGH (ref 70–99)
Potassium: 3.9 mmol/L (ref 3.5–5.1)
Sodium: 131 mmol/L — ABNORMAL LOW (ref 135–145)

## 2023-06-20 LAB — GLUCOSE, CAPILLARY
Glucose-Capillary: 426 mg/dL — ABNORMAL HIGH (ref 70–99)
Glucose-Capillary: 372 mg/dL — ABNORMAL HIGH (ref 70–99)
Glucose-Capillary: 161 mg/dL — ABNORMAL HIGH (ref 70–99)
Glucose-Capillary: 83 mg/dL (ref 70–99)

## 2023-06-20 LAB — MAGNESIUM: Magnesium: 1.6 mg/dL — ABNORMAL LOW (ref 1.7–2.4)

## 2023-06-20 LAB — VITAMIN B1: Vitamin B1 (Thiamine): 109.5 nmol/L (ref 66.5–200.0)

## 2023-06-20 MED ORDER — INSULIN ASPART 100 UNIT/ML IJ SOLN
12.0000 [IU] | Freq: Once | INTRAMUSCULAR | Status: AC
  Administered 2023-06-20: 12 [IU] via SUBCUTANEOUS

## 2023-06-20 NOTE — Plan of Care (Signed)

## 2023-06-20 NOTE — Plan of Care (Signed)
  Problem: Education: Goal: Knowledge of General Education information will improve Description: Including pain rating scale, medication(s)/side effects and non-pharmacologic comfort measures Outcome: Progressing   Problem: Health Behavior/Discharge Planning: Goal: Ability to manage health-related needs will improve Outcome: Progressing   Problem: Clinical Measurements: Goal: Ability to maintain clinical measurements within normal limits will improve Outcome: Progressing Goal: Will remain free from infection Outcome: Progressing Goal: Diagnostic test results will improve Outcome: Progressing Goal: Respiratory complications will improve Outcome: Progressing Goal: Cardiovascular complication will be avoided Outcome: Progressing   Problem: Activity: Goal: Risk for activity intolerance will decrease Outcome: Progressing   Problem: Nutrition: Goal: Adequate nutrition will be maintained Outcome: Progressing   Problem: Coping: Goal: Level of anxiety will decrease Outcome: Progressing   Problem: Elimination: Goal: Will not experience complications related to bowel motility Outcome: Progressing Goal: Will not experience complications related to urinary retention Outcome: Progressing   Problem: Pain Management: Goal: General experience of comfort will improve Outcome: Progressing   Problem: Safety: Goal: Ability to remain free from injury will improve Outcome: Progressing   Problem: Skin Integrity: Goal: Risk for impaired skin integrity will decrease Outcome: Progressing   Problem: Education: Goal: Ability to describe self-care measures that may prevent or decrease complications (Diabetes Survival Skills Education) will improve Outcome: Progressing Goal: Individualized Educational Video(s) Outcome: Progressing   Problem: Coping: Goal: Ability to adjust to condition or change in health will improve Outcome: Progressing   Problem: Fluid Volume: Goal: Ability to  maintain a balanced intake and output will improve Outcome: Progressing   Problem: Health Behavior/Discharge Planning: Goal: Ability to identify and utilize available resources and services will improve Outcome: Progressing Goal: Ability to manage health-related needs will improve Outcome: Progressing   Problem: Metabolic: Goal: Ability to maintain appropriate glucose levels will improve Outcome: Progressing   Problem: Nutritional: Goal: Maintenance of adequate nutrition will improve Outcome: Progressing Goal: Progress toward achieving an optimal weight will improve Outcome: Progressing   Problem: Skin Integrity: Goal: Risk for impaired skin integrity will decrease Outcome: Progressing   Problem: Tissue Perfusion: Goal: Adequacy of tissue perfusion will improve Outcome: Progressing   Problem: Education: Goal: Knowledge of disease or condition will improve Outcome: Progressing Goal: Knowledge of secondary prevention will improve (MUST DOCUMENT ALL) Outcome: Progressing Goal: Knowledge of patient specific risk factors will improve Loraine Leriche N/A or DELETE if not current risk factor) Outcome: Progressing   Problem: Ischemic Stroke/TIA Tissue Perfusion: Goal: Complications of ischemic stroke/TIA will be minimized Outcome: Progressing   Problem: Coping: Goal: Will verbalize positive feelings about self Outcome: Progressing Goal: Will identify appropriate support needs Outcome: Progressing   Problem: Health Behavior/Discharge Planning: Goal: Ability to manage health-related needs will improve Outcome: Progressing Goal: Goals will be collaboratively established with patient/family Outcome: Progressing   Problem: Self-Care: Goal: Ability to participate in self-care as condition permits will improve Outcome: Progressing Goal: Verbalization of feelings and concerns over difficulty with self-care will improve Outcome: Progressing Goal: Ability to communicate needs accurately  will improve Outcome: Progressing   Problem: Nutrition: Goal: Risk of aspiration will decrease Outcome: Progressing Goal: Dietary intake will improve Outcome: Progressing

## 2023-06-20 NOTE — Progress Notes (Signed)
PROGRESS NOTE    [name] [name]  WUJ:[id number] DOB: 1934-07-14 DOA: 06/14/2023 PCP: [name], MD   Brief Narrative:  89 y.o. female with medical history significant for dementia, insulin-dependent T2DM, dermatomyositis (on Plaquenil and chronic prednisone 5 mg daily), PAD, history of syncope due to orthostasis and vasovagal episodes, history of seizure, hypothyroidism, macrocytic anemia, recent hospitalization in October 2024 for acute encephalopathy secondary to Klebsiella UTI treated with IV antibiotics followed by an admission from 05/18/2023-05/24/2023 for multifocal aspiration pneumonia treated with IV antibiotics along with acute metabolic encephalopathy presented with increased lethargy.  Workup revealed acute to early subacute ischemic infarct involving right thalamocapsular region, additional small acute to early subacute infarct in the inferior left cerebellum.  Neurology was consulted.  Palliative care was also consulted.  Assessment & Plan:   Acute/subacute infarct in the right thalamocapsular region, small/subacute infarct in the inferior left cerebellum Acute metabolic encephalopathy History of dementia Hyperlipidemia Dysphagia -Possibly due to advanced intracranial atherosclerosis -CTA head and neck showed advanced intracranial atherosclerosis with multifocal moderate pan stenosis. -2D echo showed EF of 60 to 60%, negative bubble study.  Lower extremity duplex did not show any DVT. -EEG showed mild diffuse encephalopathy.  No seizures -LDL 167.  A1c 8.5.  Continue ezetimibe.  Patient intolerant of statins per daughters -Neurology recommended aspirin and Plavix for 3 months then Plavix alone -Patient is mostly bedbound hence PT did not evaluate the patient -Diet as per SLP recommendations  Failure to thrive Poor oral intake Severe malnutrition Goals of care -Oral intake still not that great.  If does not improve, consider hospice.  Continue gentle  hydration. -Palliative care following.  Follow recommendations.  Family contemplating home hospice. -Follow nutrition recommendations  Probable UTI -Continue Rocephin.  Follow cultures  Leukocytosis -Possible reactive.  Resolved  Anemia of chronic disease -From chronic illnesses.  Hemoglobin stable currently.  Monitor intermittently  Hyponatremia -Possibly from poor oral intake.  Sodium pending today.  Monitor  Hypokalemia -Labs pending today  Diabetes mellitus type 2 with hyperglycemia and hypoglycemia -Continue long-acting insulin.  CBGs with SSI  Dermatomyositis chronic debility with bed/wheelchair bound -Continue home prednisone.  Resume Plaquenil and methotrexate on discharge.  Outpatient follow-up with rheumatology  History of seizures -Continue Keppra.  Outpatient follow-up with neurology  Hypertension -Monitor blood pressure.  Not on any antihypertensives at this time  Peripheral artery disease -Status post angioplasty of bilateral common iliac artery stenosis in 01/2023.  Continue aspirin, Plavix, Zetia.   Stage II mid sacrum pressure injury: Present on admission -Continue local wound care  DVT prophylaxis: Lovenox Code Status: DNR Family Communication: Daughter at bedside and another daughter on phone.   Disposition Plan: Status is: Inpatient Remains inpatient appropriate because: Of severity of illness.  Oral intake still extremely poor.  Home with home hospice if family agreeable  Consultants: Neurology/palliative care  Procedures: As above  Antimicrobials: None  Subjective: Patient seen and examined at bedside.  No seizures, fever, agitation or vomiting reported.  Oral intake remains poor.  Objective: Vitals:   06/19/23 2300 06/19/23 2315 06/20/23 0258 06/20/23 0341  BP: (!) 171/67 (!) 161/67  (!) 169/72  Pulse: (!) 59 62  63  Resp: 12 11  13   Temp: 98 F (36.7 C) 98 F (36.7 C)  98 F (36.7 C)  TempSrc: Axillary Axillary  Oral  SpO2: 100%  100%  97%  Weight:   48 kg   Height:        Intake/Output Summary (Last 24  hours) at 06/20/2023 0725 Last data filed at 06/20/2023 0417 Gross per 24 hour  Intake 1353.9 ml  Output 2050 ml  Net -696.1 ml   Filed Weights   06/18/23 0340 06/19/23 0500 06/20/23 0258  Weight: 48.2 kg 48.2 kg 48 kg    Examination:  General: No acute distress.  Currently on room air.  Chronically ill and deconditioned looking. ENT/neck: No JVD elevation or palpable thyromegaly  respiratory: Bilateral decreased breath sounds at bases with scattered crackles CVS: S1-S2 heard; mild intermittent bradycardia present  abdominal: Soft, nontender, still slightly distended; no organomegaly, bowel sounds are heard normally extremities: No clubbing; mild lower extremity edema present CNS: Wakes up slightly, hardly participates in conversation Lymph: No lymphadenopathy palpable  skin: No obvious petechiae/rashes psych: Showing no signs of agitation.  Extremely flat affect.   Musculoskeletal: No obvious joint erythema/deformity  Data Reviewed: I have personally reviewed following labs and imaging studies  CBC: Recent Labs  Lab 06/14/23 1730 06/14/23 1750 06/16/23 0535 06/17/23 0447 06/18/23 0614 06/19/23 0413 06/20/23 0636  WBC 18.6*   < > 7.5 6.3 6.4 5.4 5.7  NEUTROABS 17.0*  --   --   --   --   --  4.0  HGB 9.2*   < > 8.5* 8.3* 9.3* 9.0* 9.4*  HCT 27.9*   < > 25.3* 25.7* 28.6* 27.9* 27.9*  MCV 98.9   < > 95.8 96.6 100.0 97.6 97.9  PLT 309   < > 324 314 313 327 324   < > = values in this interval not displayed.   Basic Metabolic Panel: Recent Labs  Lab 06/15/23 0330 06/15/23 0607 06/16/23 0535 06/16/23 0757 06/17/23 0447 06/18/23 0614 06/19/23 0413  NA 133*   < > 132* 129* 133* 134* 136  K 3.6  --  3.7  --  3.2* 3.6 3.2*  CL 97*  --  98  --  103 101 105  CO2 29  --  23  --  26 24 26   GLUCOSE 141*  --  276* 462* 118* 110* 84  BUN 14  --  15  --  10 12 8   CREATININE 0.74  --  0.81  --  0.69  0.78 0.60  CALCIUM 9.7  --  9.5  --  9.2 9.4 9.3  MG  --   --   --   --  1.7 1.7 1.8   < > = values in this interval not displayed.   GFR: Estimated Creatinine Clearance: 37.5 mL/min (by C-G formula based on SCr of 0.6 mg/dL). Liver Function Tests: Recent Labs  Lab 06/14/23 1730 06/15/23 0330 06/16/23 0535 06/17/23 0447  AST 26 24 21 20   ALT 39 34 28 24  ALKPHOS 125 109 101 96  BILITOT 0.4 0.7 0.6 0.5  PROT 6.9 6.6 5.8* 5.8*  ALBUMIN 3.2* 3.0* 2.7* 2.6*   No results for input(s): "LIPASE", "AMYLASE" in the last 168 hours. Recent Labs  Lab 06/14/23 1730  AMMONIA 20   Coagulation Profile: Recent Labs  Lab 06/14/23 1730  INR 1.1   Cardiac Enzymes: No results for input(s): "CKTOTAL", "CKMB", "CKMBINDEX", "TROPONINI" in the last 168 hours. BNP (last 3 results) No results for input(s): "PROBNP" in the last 8760 hours. HbA1C: No results for input(s): "HGBA1C" in the last 72 hours.  CBG: Recent Labs  Lab 06/18/23 2108 06/19/23 0742 06/19/23 1122 06/19/23 1555 06/19/23 2026  GLUCAP 111* 125* 176* 120* 154*   Lipid Profile: No results for input(s): "CHOL", "HDL", "  LDLCALC", "TRIG", "CHOLHDL", "LDLDIRECT" in the last 72 hours.  Thyroid Function Tests: No results for input(s): "TSH", "T4TOTAL", "FREET4", "T3FREE", "THYROIDAB" in the last 72 hours. Anemia Panel: No results for input(s): "VITAMINB12", "FOLATE", "FERRITIN", "TIBC", "IRON", "RETICCTPCT" in the last 72 hours.  Sepsis Labs: No results for input(s): "PROCALCITON", "LATICACIDVEN" in the last 168 hours.  Recent Results (from the past 240 hour(s))  Blood culture (routine x 2)     Status: None   Collection Time: 06/14/23  8:20 PM   Specimen: BLOOD  Result Value Ref Range Status   Specimen Description   Final    BLOOD LEFT ANTECUBITAL Performed at [facility], [address] [place], [place] [address]    Special Requests   Final    BOTTLES DRAWN AEROBIC AND ANAEROBIC Blood Culture  adequate volume Performed at [facility], [address], [place], [place] [address]    Culture   Final    NO GROWTH 5 DAYS Performed at [facility], [address] [place], [place] [address]    Report Status 06/19/2023 FINAL  Final  Culture, blood (Routine X 2) w Reflex to ID Panel     Status: None (Preliminary result)   Collection Time: 06/15/23  3:30 AM   Specimen: BLOOD  Result Value Ref Range Status   Specimen Description   Final    BLOOD BLOOD RIGHT FOREARM Performed at [facility], [address], [place], [place] [address]    Special Requests   Final    BOTTLES DRAWN AEROBIC AND ANAEROBIC Blood Culture adequate volume Performed at [facility], [address], [place], [place] [address]    Culture   Final    NO GROWTH 4 DAYS Performed at [facility], [address], [place], [place] [address]    Report Status PENDING  Incomplete  Culture, blood (Routine X 2) w Reflex to ID Panel     Status: None (Preliminary result)   Collection Time: 06/15/23  6:07 AM   Specimen: BLOOD  Result Value Ref Range Status   Specimen Description BLOOD BLOOD RIGHT HAND  Final   Special Requests   Final    BOTTLES DRAWN AEROBIC AND ANAEROBIC Blood Culture adequate volume   Culture   Final    NO GROWTH 4 DAYS Performed at [facility], [address], [place], [place] [address]    Report Status PENDING  Incomplete  Urine Culture     Status: None (Preliminary result)   Collection Time: 06/18/23 10:17 PM   Specimen: Urine, Random  Result Value Ref Range Status   Specimen Description URINE, RANDOM  Final   Special Requests NONE Reflexed from [id number]  Final   Culture   Final    CULTURE REINCUBATED FOR BETTER GROWTH Performed at [facility], [address], [place], [place] [address]    Report Status PENDING  Incomplete         Radiology Studies: No results found.      Scheduled Meds:  aspirin  EC  81 mg Oral q AM   clopidogrel  75 mg Oral Q breakfast   enoxaparin (LOVENOX) injection  40 mg Subcutaneous Q24H   ezetimibe  10 mg Oral QHS   feeding supplement  1 Container Oral TID BM   insulin aspart  0-5 Units Subcutaneous QHS   insulin aspart  0-9 Units Subcutaneous TID WC   insulin glargine-yfgn  5 Units Subcutaneous  Daily   levETIRAcetam  500 mg Oral BID   levothyroxine  100 mcg Oral Q0600   multivitamin with minerals  1 tablet Oral Daily   predniSONE  5 mg Oral Q breakfast   sodium chloride flush  3 mL Intravenous Q12H   sodium chloride flush  3 mL Intravenous Q12H   Continuous Infusions:  sodium chloride 50 mL/hr at 06/20/23 0649   cefTRIAXone (ROCEPHIN)  IV Stopped (06/19/23 1304)           Glade Lloyd, MD [facility] 06/20/2023, 7:25 AM

## 2023-06-21 DIAGNOSIS — G9341 Metabolic encephalopathy: Secondary | ICD-10-CM | POA: Diagnosis not present

## 2023-06-21 LAB — GLUCOSE, CAPILLARY
Glucose-Capillary: 309 mg/dL — ABNORMAL HIGH (ref 70–99)
Glucose-Capillary: 186 mg/dL — ABNORMAL HIGH (ref 70–99)

## 2023-06-21 LAB — URINE CULTURE

## 2023-06-21 MED ORDER — NITROFURANTOIN MONOHYD MACRO 100 MG PO CAPS
100.0000 mg | ORAL_CAPSULE | Freq: Two times a day (BID) | ORAL | Status: DC
  Administered 2023-06-21: 100 mg via ORAL
  Filled 2023-06-21 (×2): qty 1

## 2023-06-21 MED ORDER — NITROFURANTOIN MONOHYD MACRO 100 MG PO CAPS: 100.0000 mg | ORAL_CAPSULE | Freq: Two times a day (BID) | ORAL | 0 refills | Status: AC

## 2023-06-21 MED ORDER — GERHARDT'S BUTT CREAM
TOPICAL_CREAM | Freq: Two times a day (BID) | CUTANEOUS | Status: DC
  Filled 2023-06-21: qty 1

## 2023-06-21 MED ORDER — CLOPIDOGREL BISULFATE 75 MG PO TABS: 75.0000 mg | ORAL_TABLET | Freq: Every day | ORAL | 1 refills | Status: AC

## 2023-06-21 NOTE — Consult Note (Signed)
[facility] Liaison Consult Note    06/21/2023  [name] [name] 1934/02/23 [id number]  Insurance:   Primary Care Provider: [name], MD, [facility] Physician, this provider is listed for the transition of care follow up appointments  and [facility] Transition team calls   [facility] Liaison screened the patient remotely at [facility].    The patient was screened for 30 day readmission hospitalization with noted extreme high risk score for unplanned readmission risk 7 hospital admissions in 6 months.  The patient was assessed for potential Community Care Coordination service needs for post hospital transition for care coordination. Review of patient's electronic medical record reveals patient is for home with Hospice..   Plan: [facility] Liaison will continue to follow progress and disposition to asess for post hospital community care coordination/management needs.  Referral request for community care coordination: will follow up with [facility] transitional team to notify of transition.   [facility] Community Care, Population Health does not replace or interfere with any arrangements made by the Inpatient Transition of Care team.   For questions contact:   Charlesetta Shanks, RN, BSN, CCM [place]  [facility] Liaison Direct Dial: [phone] or secure chat Email: [email or web address]

## 2023-06-21 NOTE — Progress Notes (Signed)
     Referral previously received for [name] [name] for goals of care discussion. Chart reviewed and updates received from RN. See last PMT note dated 06/19/23.  Chart reviewed and no significant clinical changes or ongoing palliative needs noted. At this time goals are clear, plans for d/c to home with hospice through [facility] today. I called and spoke with the patient's daughters [name] [name] and [name] [name] who have confirmed the plan. I made sure they did not have any further questions or needs from our team. We will sign off for now and discontinue consult order. Please contact us and enter a new consult order for any significant change/new palliative care needs.  Thank you for your referral and allowing PMT to assist in [name] [name]'s care.   Wynne Dust, NP Palliative Medicine Team Phone: [phone]  NO CHARGE

## 2023-06-21 NOTE — Consult Note (Addendum)
WOC Nurse Consult Note: Reason for Consult: R toe diabetic ulcer draining purulent material  Wound type: 1.  full thickness distal tip R great toe r/t diabetic/neuropathy  2.  Stage 2 PI sacrum  Pressure Injury POA: Yes/No/NA Measurement: see nursing flowsheet  Wound bed: 1.  Distal tip R great toe 100% yellow  2.  Stage 2 sacrum pink and moist  Drainage (amount, consistency, odor) purulent per MD note  Periwound: erythema  Dressing procedure/placement/frequency: 1.  Cleanse R great toe with NS, apply silver hydrofiber [facility] [phone]) cut to fit wound bed daily, secure with dry gauze and tape.   2.  Cleanse sacrum with soap and water, apply Gerhardt's butt cream to sacrum and buttocks 2 times a day and prn soiling.   Patient is discharging with hospice, no aggressive measures desired for treatment of R great toe.    POC discussed with bedside nurse. WOC team will not follow. Re-consult if further needs arise.   Thank you,    Priscella Mann MSN, RN-BC, [employer] [phone]

## 2023-06-21 NOTE — TOC Transition Note (Signed)
Transition of Care (TOC) - CM/SW Discharge Note Donn Pierini RN,BSN Transitions of Care Unit 4NP (Non Trauma)- RN Case Manager See Treatment Team for direct Phone #   Patient Details  Name: [name] [name] MRN: [id number] Date of Birth: [birth year]  Transition of Care [facility]) CM/SW Contact:  Darrold Span, RN Phone Number: 06/21/2023, 11:59 AM   Clinical Narrative:    Referral made to [facility] for home Hospice needs per weekend CM- Pt stable for transition home with hospice today.  CM checked with [facility] liaison- per Norm Parcel- family has confirmed that they are ready for pt to return home today- [facility] will plan to see pt with nursing admit visit later today.  Pt will transport home via EMS.   [facility] called at 1130 for transport with ETA for pickup within the hour- daughter [name] called and updated- [name] confirmed address and voiced she is already at the home to receive pt.   Paperwork and GOLD DNR placed on chart. Bedside RN updated on transport timing.    Final next level of care: Home w Hospice Care Barriers to Discharge: Barriers Resolved   Patient Goals and CMS Choice CMS Medicare.gov Compare Post Acute Care list provided to:: Patient Represenative (must comment) Choice offered to / list presented to : Adult Children  Discharge Placement                 Home w/ Hospice         Discharge Plan and Services Additional resources added to the After Visit Summary for     Discharge Planning Services: CM Consult Post Acute Care Choice: Hospice          DME Arranged: N/A DME Agency: NA         HH Agency: [facility] Date HH Agency Contacted: 06/21/23 Time HH Agency Contacted: 1000 Representative spoke with at [facility] Agency: Cheri  Social Determinants of Health (SDOH) Interventions SDOH Screenings   Food Insecurity: No Food Insecurity (06/15/2023)  Housing: Low Risk  (06/15/2023)   Transportation Needs: No Transportation Needs (06/15/2023)  Utilities: Not At Risk (06/15/2023)  Social Connections: Unknown (12/02/2021)   [facility] from [facility]  Tobacco Use: Medium Risk (06/14/2023)     Readmission Risk Interventions    06/21/2023   11:59 AM 05/24/2023   12:19 PM 03/17/2023    2:50 PM  Readmission Risk Prevention Plan  Transportation Screening Complete Complete Complete  Medication Review [employer])  Complete Complete  PCP or Specialist appointment within 3-5 days of discharge  Complete Complete  HRI or Home Care Consult Complete Complete Complete  SW Recovery Care/Counseling Consult Complete Complete Complete  Palliative Care Screening Complete Complete Not Applicable  Skilled Nursing Facility Not Applicable Not Applicable Patient Refused

## 2023-06-21 NOTE — Discharge Summary (Signed)
Physician Discharge Summary  [name] [name] GGY:[id number] DOB: 28-Aug-1933 DOA: 06/14/2023  PCP: [name], MD  Admit date: 06/14/2023 Discharge date: 06/21/2023  Admitted From: Home Disposition: Home with hospice  Recommendations for Outpatient Follow-up:  Follow up with home hospice provider at earliest convenience   Home Health: No Equipment/Devices: None  Discharge Condition: Poor CODE STATUS: DNR Diet recommendation: As per SLP recommendations Brief/Interim Summary: 89 y.o. female with medical history significant for dementia, insulin-dependent T2DM, dermatomyositis (on Plaquenil and chronic prednisone 5 mg daily), PAD, history of syncope due to orthostasis and vasovagal episodes, history of seizure, hypothyroidism, macrocytic anemia, recent hospitalization in October 2024 for acute encephalopathy secondary to Klebsiella UTI treated with IV antibiotics followed by an admission from 05/18/2023-05/24/2023 for multifocal aspiration pneumonia treated with IV antibiotics along with acute metabolic encephalopathy presented with increased lethargy.  Workup revealed acute to early subacute ischemic infarct involving right thalamocapsular region, additional small acute to early subacute infarct in the inferior left cerebellum.  Neurology was consulted.  Palliative care was also consulted.  During the hospitalization, [name] [name] mental status and oral intake did not improve much.  Palliative care was consulted.  After multiple discussions with palliative care team and medical team, family has decided to pursue home hospice.  [name] [name] will be discharged to home with hospice once arrangements are made.  Discharge Diagnoses:   Acute/subacute infarct in the right thalamocapsular region, small/subacute infarct in the inferior left cerebellum Acute metabolic encephalopathy History of dementia Hyperlipidemia Dysphagia -Possibly due to advanced intracranial atherosclerosis -CTA head and neck showed advanced  intracranial atherosclerosis with multifocal moderate pan stenosis. -2D echo showed EF of 60 to 60%, negative bubble study.  Lower extremity duplex did not show any DVT. -EEG showed mild diffuse encephalopathy.  No seizures -LDL 167.  A1c 8.5.  Continue ezetimibe.  Patient intolerant of statins per daughters -Neurology recommended aspirin and Plavix for 3 months then Plavix alone -Patient is mostly bedbound hence PT did not evaluate the patient -Diet as per SLP recommendations   Failure to thrive Poor oral intake Severe malnutrition Goals of care -Oral intake and mental status did not improve much during the hospitalization. -Palliative care following.   -After multiple discussions with palliative care team and medical team, family has decided to pursue home hospice.  [name] [name] will be discharged to home with hospice once arrangements are made.   Probable UTI -Urine culture grew E faecalis.  Switch Rocephin to oral nitrofurantoin and discharged home on oral nitrofurantoin for 5 days.    Leukocytosis -Resolved   Anemia of chronic disease -From chronic illnesses.  Hemoglobin stable currently.    Hyponatremia -Possibly from poor oral intake.  Sodium 131 on 06/20/2023.  Hypokalemia -Improved  Hypomagnesemia -No labs today   Diabetes mellitus type 2 with hyperglycemia and hypoglycemia -Blood sugars fluctuating.  Continue insulin if family  wants to continue treating blood sugars.    Dermatomyositis chronic debility with bed/wheelchair bound -Continue home prednisone.  Resume Plaquenil and methotrexate on discharge.     History of seizures -Continue Keppra.  Outpatient follow-up with neurology   Hypertension -Continue metoprolol as needed.   Peripheral artery disease -Status post angioplasty of bilateral common iliac artery stenosis in 01/2023.  Continue aspirin, Plavix, Zetia.    Stage II mid sacrum pressure injury: Present on admission  Discharge Instructions  Discharge  Instructions     Ambulatory referral to Neurology   Complete by: As directed    Follow up with Dr. Marjory Lies at [facility]  in 4-6 weeks.  Patient is Dr. Richrd Humbles patient. Thanks.   No wound care   Complete by: As directed       Allergies as of 06/21/2023       Reactions   Nsaids Other (See Comments)   No NSAIDS due to kidney function- ESPECIALLY ibuprofen or Aleve   Biaxin [clarithromycin] Hives   Statins Other (See Comments)   Muscle pain (myositis per daugher)   Bactrim [sulfamethoxazole-trimethoprim] Hives, Rash   Lisinopril Cough   Sulfa Antibiotics Hives, Rash, Other (See Comments)   NO sulfa-based meds!!        Medication List     STOP taking these medications    midodrine 5 MG tablet Commonly known as: PROAMATINE       TAKE these medications    acetaminophen 650 MG CR tablet Commonly known as: TYLENOL Take 650 mg by mouth every 8 (eight) hours as needed for pain.   Bayer Low Dose 81 MG tablet Generic drug: aspirin EC Take 81 mg by mouth in the morning. Swallow whole.   cetirizine 10 MG tablet Commonly known as: ZYRTEC Take 1 tablet (10 mg total) by mouth daily as needed for allergies or rhinitis.   clopidogrel 75 MG tablet Commonly known as: PLAVIX Take 1 tablet (75 mg total) by mouth daily with breakfast.   CRANBERRY PO Take 15 mLs by mouth daily. Cystex Liquid: UTI Probiotic 1 tablespoon once a day   ezetimibe 10 MG tablet Commonly known as: Zetia Take 1 tablet (10 mg total) by mouth at bedtime.   famotidine 20 MG tablet Commonly known as: PEPCID Take 20 mg by mouth at bedtime.   ferrous sulfate 220 (44 Fe) MG/5ML solution Take 330 mg by mouth 3 (three) times a week. Take Tues, Thurs, Fri   folic acid 1 MG tablet Commonly known as: FOLVITE Take 1 mg by mouth at bedtime.   Levemir FlexTouch 100 UNIT/ML FlexTouch Pen Generic drug: insulin detemir Inject 3-7 Units into the skin See admin instructions. Inject 7 units into the skin in the  morning and 3 units at bedtime PRN   levETIRAcetam 500 MG tablet Commonly known as: KEPPRA Take 500 mg by mouth every 12 (twelve) hours.   methotrexate 2.5 MG tablet Commonly known as: RHEUMATREX Take 1 tablet (2.5 mg total) by mouth once a week. Caution:Chemotherapy. Protect from light. What changed:  how much to take when to take this   metoprolol tartrate 25 MG tablet Commonly known as: LOPRESSOR Take 0.5 tablets (12.5 mg total) by mouth as needed (if Systolic number is over 160 for over 2 hours).   multivitamin with minerals Tabs tablet Take 1 tablet by mouth daily with breakfast. OneaDay 50+ adult multivitamin   nitrofurantoin (macrocrystal-monohydrate) 100 MG capsule Commonly known as: MACROBID Take 1 capsule (100 mg total) by mouth every 12 (twelve) hours for 5 days.   NovoLOG FlexPen 100 UNIT/ML FlexPen Generic drug: insulin aspart Inject 2-10 Units into the skin See admin instructions. Inject 2-10 units into the skin three times a day with meals, per sliding scale:  Breakfast: BGL 80-199 = 8 units; 200-299 = 9 units; 300 or greater = 10 units Lunch: BGL 80-199 = 5 units; 200-299 = 6 units; 300 or greater = 7 units Supper/evening meal: BGL 80-199 = 2 units; 200-299 = 3 units; 300 or greater = 4 units   nystatin 100000 UNIT/ML suspension Commonly known as: MYCOSTATIN Take 5 mLs (500,000 Units total) by mouth 4 (four) times daily.  Plaquenil 200 MG tablet Generic drug: hydroxychloroquine Take 200 mg by mouth at bedtime.   predniSONE 5 MG tablet Commonly known as: DELTASONE Take 5 mg by mouth daily with breakfast.   Synthroid 100 MCG tablet Generic drug: levothyroxine Take 100 mcg by mouth daily before breakfast.   triamcinolone ointment 0.1 % Commonly known as: KENALOG Apply 1 Application topically 2 (two) times daily as needed (skin irritation- affected areas).   VITAMIN D-3 PO Take 2,000 Units by mouth daily.        Follow-up Information      Penumalli, Glenford Bayley, MD. Schedule an appointment as soon as possible for a visit in 1 month(s).   Specialties: Neurology, Radiology Contact information: [address] [place] [address] [phone]                Allergies  Allergen Reactions   Nsaids Other (See Comments)    No NSAIDS due to kidney function- ESPECIALLY ibuprofen or Aleve   Biaxin [Clarithromycin] Hives   Statins Other (See Comments)    Muscle pain (myositis per daugher)   Bactrim [Sulfamethoxazole-Trimethoprim] Hives and Rash   Lisinopril Cough   Sulfa Antibiotics Hives, Rash and Other (See Comments)    NO sulfa-based meds!!     Consultations: Neurology/palliative care   Procedures/Studies: VAS [place] LOWER EXTREMITY VENOUS (DVT)  Result Date: 06/16/2023  Lower Venous DVT Study Patient Name:  ARIBELLE RACE  Date of Exam:   06/15/2023 Medical Rec #: [id number]        Accession #:    [id number] Date of Birth: 12/15/1933        Patient Gender: F Patient Age:   16 years Exam Location:  [facility] Procedure:      VAS [place] LOWER EXTREMITY VENOUS (DVT) Referring Phys: Scheryl Marten XU --------------------------------------------------------------------------------  Indications: Stroke, and Embolic stroke. Other Indications: Anemia. Comparison Study: No prior exam. Performing Technologist: Fernande Bras  Examination Guidelines: A complete evaluation includes B-mode imaging, spectral Doppler, color Doppler, and power Doppler as needed of all accessible portions of each vessel. Bilateral testing is considered an integral part of a complete examination. Limited examinations for reoccurring indications may be performed as noted. The reflux portion of the exam is performed with the patient in reverse Trendelenburg.  +---------+---------------+---------+-----------+----------+--------------+ RIGHT    CompressibilityPhasicitySpontaneityPropertiesThrombus Aging  +---------+---------------+---------+-----------+----------+--------------+ CFV      Full           Yes      Yes                                 +---------+---------------+---------+-----------+----------+--------------+ SFJ      Full                                                        +---------+---------------+---------+-----------+----------+--------------+ FV Prox  Full                                                        +---------+---------------+---------+-----------+----------+--------------+ FV Mid   Full                                                        +---------+---------------+---------+-----------+----------+--------------+  FV DistalFull                                                        +---------+---------------+---------+-----------+----------+--------------+ PFV      Full                                                        +---------+---------------+---------+-----------+----------+--------------+ POP      Full           Yes      Yes                                 +---------+---------------+---------+-----------+----------+--------------+ PTV      Full                                                        +---------+---------------+---------+-----------+----------+--------------+ PERO     Full                                                        +---------+---------------+---------+-----------+----------+--------------+ Distal FV limited due to calcific shadowing in atery.  +---------+---------------+---------+-----------+----------+--------------+ LEFT     CompressibilityPhasicitySpontaneityPropertiesThrombus Aging +---------+---------------+---------+-----------+----------+--------------+ CFV      Full           Yes      Yes                                 +---------+---------------+---------+-----------+----------+--------------+ SFJ      Full                                                         +---------+---------------+---------+-----------+----------+--------------+ FV Prox  Full                                                        +---------+---------------+---------+-----------+----------+--------------+ FV Mid   Full                                                        +---------+---------------+---------+-----------+----------+--------------+ FV DistalFull                                                        +---------+---------------+---------+-----------+----------+--------------+  PFV      Full                                                        +---------+---------------+---------+-----------+----------+--------------+ POP      Full           Yes      Yes                                 +---------+---------------+---------+-----------+----------+--------------+ PTV      Full                                                        +---------+---------------+---------+-----------+----------+--------------+ PERO     Full                                                        +---------+---------------+---------+-----------+----------+--------------+ Distal FV limited due to calcific shadowing in atery.    Summary: BILATERAL: - No evidence of deep vein thrombosis seen in the lower extremities, bilaterally. -No evidence of popliteal cyst, bilaterally.   *See table(s) above for measurements and observations. Electronically signed by Heath Lark on 06/16/2023 at 4:43:39 PM.    Final    ECHOCARDIOGRAM COMPLETE  Result Date: 06/15/2023    ECHOCARDIOGRAM REPORT   Patient Name:   SOPHINA OBEROI Date of Exam: 06/15/2023 Medical Rec #:  [id number]       Height:       67.0 in Accession #:    [id number]      Weight:       101.6 lb Date of Birth:  1933-10-02       BSA:          1.517 m Patient Age:    89 years        BP:           153/59 mmHg Patient Gender: F               HR:           59 bpm. Exam Location:  Inpatient Procedure: 2D Echo,  Cardiac Doppler and Color Doppler Indications:    CVA  History:        Patient has prior history of Echocardiogram examinations.  Sonographer:    Milda Smart Referring Phys: [id number] SUBRINA SUNDIL IMPRESSIONS  1. Left ventricular ejection fraction, by estimation, is 60 to 65%. The left ventricle has normal function. The left ventricle has no regional wall motion abnormalities. There is mild concentric left ventricular hypertrophy. Left ventricular diastolic parameters are consistent with Grade I diastolic dysfunction (impaired relaxation).  2. Right ventricular systolic function is normal. The right ventricular size is normal. There is normal pulmonary artery systolic pressure. The estimated right ventricular systolic pressure is 30.9 mmHg.  3. The mitral valve is degenerative. Mild mitral valve regurgitation. No evidence of mitral stenosis.  4. The aortic valve is tricuspid. There is moderate calcification of  the aortic valve. Aortic valve regurgitation is not visualized. No aortic stenosis is present.  5. The inferior vena cava is normal in size with greater than 50% respiratory variability, suggesting right atrial pressure of 3 mmHg.  6. Negative bubble study, no evidence for ASD or PFO. FINDINGS  Left Ventricle: Left ventricular ejection fraction, by estimation, is 60 to 65%. The left ventricle has normal function. The left ventricle has no regional wall motion abnormalities. The left ventricular internal cavity size was normal in size. There is  mild concentric left ventricular hypertrophy. Left ventricular diastolic parameters are consistent with Grade I diastolic dysfunction (impaired relaxation). Right Ventricle: The right ventricular size is normal. No increase in right ventricular wall thickness. Right ventricular systolic function is normal. There is normal pulmonary artery systolic pressure. The tricuspid regurgitant velocity is 2.64 m/s, and  with an assumed right atrial pressure of 3 mmHg, the  estimated right ventricular systolic pressure is 30.9 mmHg. Left Atrium: Left atrial size was normal in size. Right Atrium: Right atrial size was normal in size. Pericardium: There is no evidence of pericardial effusion. Mitral Valve: The mitral valve is degenerative in appearance. There is mild calcification of the mitral valve leaflet(s). Mild to moderate mitral annular calcification. Mild mitral valve regurgitation. No evidence of mitral valve stenosis. MV peak gradient, 4.2 mmHg. The mean mitral valve gradient is 2.0 mmHg. Tricuspid Valve: The tricuspid valve is normal in structure. Tricuspid valve regurgitation is mild. Aortic Valve: The aortic valve is tricuspid. There is moderate calcification of the aortic valve. Aortic valve regurgitation is not visualized. No aortic stenosis is present. Pulmonic Valve: The pulmonic valve was normal in structure. Pulmonic valve regurgitation is not visualized. Aorta: The aortic root is normal in size and structure. Venous: The inferior vena cava is normal in size with greater than 50% respiratory variability, suggesting right atrial pressure of 3 mmHg. IAS/Shunts: Negative bubble study, no evidence for ASD or PFO.  LEFT VENTRICLE PLAX 2D LVIDd:         3.40 cm     Diastology LVIDs:         2.50 cm     LV e' medial:    3.30 cm/s LV PW:         0.80 cm     LV E/e' medial:  19.2 LV IVS:        0.90 cm     LV e' lateral:   5.78 cm/s LVOT diam:     2.00 cm     LV E/e' lateral: 10.9 LV SV:         76 LV SV Index:   50 LVOT Area:     3.14 cm  LV Volumes (MOD) LV vol d, MOD A2C: 43.5 ml LV vol d, MOD A4C: 47.7 ml LV vol s, MOD A2C: 14.6 ml LV vol s, MOD A4C: 17.3 ml LV SV MOD A2C:     28.9 ml LV SV MOD A4C:     47.7 ml LV SV MOD BP:      32.0 ml RIGHT VENTRICLE            IVC RV S prime:     9.14 cm/s  IVC diam: 1.50 cm TAPSE (M-mode): 2.0 cm LEFT ATRIUM             Index        RIGHT ATRIUM           Index LA diam:  1.90 cm 1.25 cm/m   RA Area:     10.80 cm LA Vol (A2C):    27.9 ml 18.40 ml/m  RA Volume:   22.50 ml  14.84 ml/m LA Vol (A4C):   22.1 ml 14.57 ml/m LA Biplane Vol: 26.7 ml 17.60 ml/m  AORTIC VALVE LVOT Vmax:   85.70 cm/s LVOT Vmean:  61.400 cm/s LVOT VTI:    0.241 m  AORTA Ao Root diam: 2.80 cm Ao Asc diam:  3.00 cm MITRAL VALVE               TRICUSPID VALVE MV Area (PHT): 2.12 cm    TR Peak grad:   27.9 mmHg MV Area VTI:   2.62 cm    TR Mean grad:   18.0 mmHg MV Peak grad:  4.2 mmHg    TR Vmax:        264.00 cm/s MV Mean grad:  2.0 mmHg    TR Vmean:       204.0 cm/s MV Vmax:       1.03 m/s MV Vmean:      64.4 cm/s   SHUNTS MV Decel Time: 357 msec    Systemic VTI:  0.24 m MR Peak grad: 105.3 mmHg   Systemic Diam: 2.00 cm MR Vmax:      513.00 cm/s MV E velocity: 63.20 cm/s MV A velocity: 74.40 cm/s MV E/A ratio:  0.85 Dalton McleanMD Electronically signed by Wilfred Lacy Signature Date/Time: 06/15/2023/2:15:07 PM    Final    EEG adult  Result Date: 06/15/2023 Charlsie Quest, MD     06/15/2023  9:43 AM Patient Name: REIYA PEMBLE MRN: [id number] Epilepsy Attending: Charlsie Quest Referring Physician/Provider: Tereasa Coop, MD Duration: 24.26 mins Patient history: 89yo F with ams getting eeg to evaluate for seizure Level of alertness: Awake, asleep AEDs during EEG study: LEV Technical aspects: This EEG study was done with scalp electrodes positioned according to the 10-20 International system of electrode placement. Electrical activity was reviewed with band pass filter of 1-70Hz , sensitivity of 7 uV/mm, display speed of 71mm/sec with a 60Hz  notched filter applied as appropriate. EEG data were recorded continuously and digitally stored.  Video monitoring was available and reviewed as appropriate. Description: The posterior dominant rhythm consists of 8 Hz activity of moderate voltage (25-35 uV) seen predominantly in posterior head regions, symmetric and reactive to eye opening and eye closing. Sleep was characterized by vertex waves, sleep spindles (12  to 14 Hz), maximal frontocentral region. EEG showed intermittent generalized 3 to 6 Hz theta-delta slowing. Physiologic photic driving was seen during photic stimulation. Hyperventilation was not performed.   ABNORMALITY - Intermittent slow, generalized IMPRESSION: This study is suggestive of mild diffuse encephalopathy. No seizures or epileptiform discharges were seen throughout the recording. Priyanka Annabelle Harman   CT ANGIO HEAD NECK W WO CM  Result Date: 06/15/2023 CLINICAL DATA:  Stroke workup EXAM: CT ANGIOGRAPHY HEAD AND NECK WITH AND WITHOUT CONTRAST TECHNIQUE: Multidetector CT imaging of the head and neck was performed using the standard protocol during bolus administration of intravenous contrast. Multiplanar CT image reconstructions and MIPs were obtained to evaluate the vascular anatomy. Carotid stenosis measurements (when applicable) are obtained utilizing NASCET criteria, using the distal internal carotid diameter as the denominator. RADIATION DOSE REDUCTION: This exam was performed according to the departmental dose-optimization program which includes automated exposure control, adjustment of the mA and/or kV according to patient size and/or use of iterative reconstruction technique. CONTRAST:  75mL OMNIPAQUE  IOHEXOL 350 MG/ML SOLN COMPARISON:  Brain MRI from yesterday FINDINGS: CT HEAD FINDINGS Brain: Known acute infarcts at the right thalamocapsular junction and inferior left cerebellum by MRI. No acute hemorrhage, hydrocephalus, mass, or collection. Vascular: No hyperdense vessel or unexpected calcification. Skull: Normal. Negative for fracture or focal lesion. Sinuses/Orbits: No acute finding. Review of the MIP images confirms the above findings CTA NECK FINDINGS Aortic arch: Atheromatous calcification with 3 vessel branching. No acute finding or covered aneurysm Right carotid system: Mild for age atheromatous plaque mainly at the bifurcation without stenosis. No ulceration or dissection. Mild ICA  beading is possible. Left carotid system: Atheromatous calcification at the bifurcation without stenosis or beading. No dissection. Vertebral arteries: No proximal subclavian stenosis. Atheromatous plaque at the right vertebral origin. No vertebral beading, flow reducing stenosis, or dissection. Skeleton: Generalized degenerative endplate and facet spurring. No acute or aggressive finding Other neck: No acute finding Upper chest: No acute finding Review of the MIP images confirms the above findings CTA HEAD FINDINGS Anterior circulation: Atheromatous calcification of the cavernous carotids. No major branch occlusion, beading, or aneurysm. Atheromatous irregularity of the MCA branches with multifocal moderate M2 narrowing on both sides. Moderate right M1 origin stenosis, see coronal thick MIPS. Posterior circulation: The vertebral and basilar arteries are smoothly contoured and widely patent. Atheromatous irregularity of the posterior cerebral arteries with moderate stenosis at the right P2 and P3 levels. Venous sinuses: Unremarkable for arterial timing Anatomic variants: As above Review of the MIP images confirms the above findings IMPRESSION: 1. No emergent arterial finding. 2. Advanced intracranial atherosclerosis with multifocal moderate branch stenosis. 3. Atherosclerosis in the neck without flow reducing stenosis. 4. No intracranial hemorrhage. No visible progression of acute infarcts seen by MRI. Electronically Signed   By: Tiburcio Pea M.D.   On: 06/15/2023 06:51   MR BRAIN W WO CONTRAST  Result Date: 06/15/2023 CLINICAL DATA:  Initial evaluation for acute altered mental status. EXAM: MRI HEAD WITHOUT AND WITH CONTRAST TECHNIQUE: Multiplanar, multiecho pulse sequences of the brain and surrounding structures were obtained without and with intravenous contrast. CONTRAST:  4.54mL GADAVIST GADOBUTROL 1 MMOL/ML IV SOLN COMPARISON:  CT from earlier the same day. FINDINGS: Brain: Examination degraded by  motion artifact. Age-related cerebral atrophy. Patchy and confluent T2/FLAIR hyperintensity involving the periventricular deep white matter both cerebral hemispheres, consistent with chronic small vessel ischemic disease, moderately advanced in nature. Small remote left cerebellar infarct noted. 1.8 cm focus of restricted diffusion involving the ventral right thalamocapsular region, consistent with an evolving acute to early subacute ischemic infarct (series 5, image 23). Additional small acute to early subacute infarct involving the inferior left cerebellum (series 5, image 7). No associated hemorrhage or significant mass effect. No other diffusion signal abnormality identified. However, there are a few small nodular foci of enhancement involving the bilateral thalami (series 16, images 72, 69). While these findings are nonspecific, changes are suspected to reflect enhancement related to evolving subacute ischemic changes. Gray-white matter differentiation otherwise maintained. No acute intracranial hemorrhage. No significant chronic intracranial blood products visible on this motion degraded exam. No mass lesion, midline shift, or mass effect. Ventricular prominence related global parenchymal volume loss without hydrocephalus. No extra-axial fluid collection. Pituitary gland suprasellar region within normal limits. No other abnormal enhancement. Vascular: Major intracranial vascular flow voids are maintained. Skull and upper cervical spine: Degenerative thickening noted about the tectorial membrane. Craniocervical junction otherwise unremarkable. Bone marrow signal intensity within normal limits. No scalp soft tissue abnormality. Sinuses/Orbits: Prior bilateral  ocular lens replacement. Paranasal sinuses are largely clear. Small bilateral mastoid effusions, of doubtful significance. Visualized nasopharynx unremarkable. Other: None. IMPRESSION: 1. 1.8 cm acute to early subacute ischemic nonhemorrhagic infarct  involving the ventral right thalamocapsular region. 2. Additional small acute to early subacute infarct involving the inferior left cerebellum. 3. Few small nodular foci of enhancement involving the bilateral thalami. While these findings are nonspecific, changes are suspected to reflect enhancement related to evolving subacute ischemic changes. Short interval follow-up MRI to ensure these changes resolve recommended. 4. Underlying age-related cerebral atrophy with moderate chronic microvascular ischemic disease. Electronically Signed   By: Rise Mu M.D.   On: 06/15/2023 01:34   CT HEAD WO CONTRAST  Result Date: 06/14/2023 CLINICAL DATA:  Altered mental status EXAM: CT HEAD WITHOUT CONTRAST TECHNIQUE: Contiguous axial images were obtained from the base of the skull through the vertex without intravenous contrast. RADIATION DOSE REDUCTION: This exam was performed according to the departmental dose-optimization program which includes automated exposure control, adjustment of the mA and/or kV according to patient size and/or use of iterative reconstruction technique. COMPARISON:  05/18/2023 FINDINGS: Brain: There is atrophy and chronic small vessel disease changes. No acute intracranial abnormality. Specifically, no hemorrhage, hydrocephalus, mass lesion, acute infarction, or significant intracranial injury. Vascular: No hyperdense vessel or unexpected calcification. Skull: No acute calvarial abnormality. Sinuses/Orbits: No acute findings Other: None IMPRESSION: Atrophy, chronic microvascular disease. No acute intracranial abnormality. Electronically Signed   By: Charlett Nose M.D.   On: 06/14/2023 17:58   DG Chest Portable 1 View  Result Date: 06/14/2023 CLINICAL DATA:  Loss of consciousness.  Altered mental status. EXAM: PORTABLE CHEST 1 VIEW COMPARISON:  AP chest 05/21/2023, 03/16/2023, 10/14/2022 FINDINGS: Cardiac silhouette and mediastinal contours are unchanged and within normal limits.  Moderate calcification within the aortic arch. Left chest wall cardiac loop recorder is again noted. Improved aeration of the right lung. No definite acute lung opacity. The current lung appearance is similar to baseline 10/14/2022 radiographs. No pleural effusion pneumothorax. No acute skeletal abnormality. IMPRESSION: Resolution of the subtle right lung airspace opacities seen on most recent 05/21/2023 radiographs. No definite acute lung opacity. Electronically Signed   By: Neita Garnet M.D.   On: 06/14/2023 17:28      Subjective: Patient seen and examined at bedside.  Awake, does not participate in conversation much.  No fever, seizures or vomiting reported.  Oral intake remains poor.  Daughter at bedside.  Discharge Exam: Vitals:   06/21/23 0757 06/21/23 0800  BP: (!) 152/61 (!) 150/67  Pulse: 70 74  Resp: 20 11  Temp: (!) 97.3 F (36.3 C)   SpO2: 97% 95%    General: Pt is elderly lying in bed.  Awake, hardly participates in any conversation.  Slow to respond.  Flat affect.  Looks chronically ill and deconditioned.  On room air.   Cardiovascular: rate controlled, S1/S2 + Respiratory: bilateral decreased breath sounds at bases with scattered crackles Abdominal: Soft, NT, ND, bowel sounds + Extremities: Trace lower extremity edema present; no cyanosis    The results of significant diagnostics from this hospitalization (including imaging, microbiology, ancillary and laboratory) are listed below for reference.     Microbiology: Recent Results (from the past 240 hour(s))  Blood culture (routine x 2)     Status: None   Collection Time: 06/14/23  8:20 PM   Specimen: BLOOD  Result Value Ref Range Status   Specimen Description   Final    BLOOD LEFT ANTECUBITAL Performed at [facility], [address] [place], [place] [address]    Special Requests   Final    BOTTLES DRAWN AEROBIC AND ANAEROBIC Blood Culture adequate volume Performed at [facility], [address], [place], [place] [address]    Culture   Final    NO GROWTH 5 DAYS Performed at [facility], [address], [place], [place] [address]    Report Status 06/19/2023 FINAL  Final  Culture, blood (Routine X 2) w Reflex to ID Panel     Status: None   Collection Time: 06/15/23  3:30 AM   Specimen: BLOOD  Result Value Ref Range Status   Specimen Description   Final    BLOOD BLOOD RIGHT FOREARM Performed at [facility], [address], [place], [place] [address]    Special Requests   Final    BOTTLES DRAWN AEROBIC AND ANAEROBIC Blood Culture adequate volume Performed at [facility], [address] [place], [place] [address]    Culture   Final    NO GROWTH 5 DAYS Performed at [facility], [address], [place], [place] [address]    Report Status 06/20/2023 FINAL  Final  Culture, blood (Routine X 2) w Reflex to ID Panel     Status: None   Collection Time: 06/15/23  6:07 AM   Specimen: BLOOD  Result Value Ref Range Status   Specimen Description BLOOD BLOOD RIGHT HAND  Final   Special Requests   Final    BOTTLES DRAWN AEROBIC AND ANAEROBIC Blood Culture adequate volume   Culture   Final    NO GROWTH 5 DAYS Performed at [facility], [address], [place], [place] [address]    Report Status 06/20/2023 FINAL  Final  Urine Culture     Status: Abnormal   Collection Time: 06/18/23 10:17 PM   Specimen: Urine, Random  Result Value Ref Range Status   Specimen Description URINE, RANDOM  Final   Special Requests   Final    NONE Reflexed from [id number] Performed at [facility], [address], [place], [place] [address]    Culture >=100,000 COLONIES/mL ENTEROCOCCUS FAECALIS (A)  Final   Report Status 06/21/2023 FINAL  Final   Organism ID, Bacteria ENTEROCOCCUS FAECALIS (A)  Final      Susceptibility   Enterococcus faecalis - MIC*    AMPICILLIN <=2 SENSITIVE Sensitive     NITROFURANTOIN <=16  SENSITIVE Sensitive     VANCOMYCIN 2 SENSITIVE Sensitive     * >=100,000 COLONIES/mL ENTEROCOCCUS FAECALIS     Labs: BNP (last 3 results) Recent Labs    09/03/22 1230 05/18/23 1625  BNP 123.0* 125.8*   Basic Metabolic Panel: Recent Labs  Lab 06/16/23 0535 06/16/23 0757 06/17/23 0447 06/18/23 0614 06/19/23 0413 06/20/23 0636  NA 132* 129* 133* 134* 136 131*  K 3.7  --  3.2* 3.6 3.2* 3.9  CL 98  --  103 101 105 97*  CO2 23  --  26 24 26 24   GLUCOSE 276* 462* 118* 110* 84 419*  BUN 15  --  10 12 8  6*  CREATININE 0.81  --  0.69 0.78 0.60 0.90  CALCIUM 9.5  --  9.2 9.4 9.3 9.3  MG  --   --  1.7 1.7 1.8 1.6*   Liver Function Tests: Recent Labs  Lab 06/14/23 1730 06/15/23 0330 06/16/23 0535 06/17/23 0447  AST 26 24 21  20  ALT 39 34 28 24  ALKPHOS 125 109 101 96  BILITOT 0.4 0.7 0.6 0.5  PROT 6.9 6.6 5.8* 5.8*  ALBUMIN 3.2* 3.0* 2.7* 2.6*   No results for input(s): "LIPASE", "AMYLASE" in the last 168 hours. Recent Labs  Lab 06/14/23 1730  AMMONIA 20   CBC: Recent Labs  Lab 06/14/23 1730 06/14/23 1750 06/16/23 0535 06/17/23 0447 06/18/23 0614 06/19/23 0413 06/20/23 0636  WBC 18.6*   < > 7.5 6.3 6.4 5.4 5.7  NEUTROABS 17.0*  --   --   --   --   --  4.0  HGB 9.2*   < > 8.5* 8.3* 9.3* 9.0* 9.4*  HCT 27.9*   < > 25.3* 25.7* 28.6* 27.9* 27.9*  MCV 98.9   < > 95.8 96.6 100.0 97.6 97.9  PLT 309   < > 324 314 313 327 324   < > = values in this interval not displayed.   Cardiac Enzymes: No results for input(s): "CKTOTAL", "CKMB", "CKMBINDEX", "TROPONINI" in the last 168 hours. BNP: Invalid input(s): "POCBNP" CBG: Recent Labs  Lab 06/20/23 0736 06/20/23 1145 06/20/23 1556 06/20/23 2144 06/21/23 0755  GLUCAP 426* 372* 161* 83 309*   D-Dimer No results for input(s): "DDIMER" in the last 72 hours. Hgb A1c No results for input(s): "HGBA1C" in the last 72 hours. Lipid Profile No results for input(s): "CHOL", "HDL", "LDLCALC", "TRIG", "CHOLHDL",  "LDLDIRECT" in the last 72 hours. Thyroid function studies No results for input(s): "TSH", "T4TOTAL", "T3FREE", "THYROIDAB" in the last 72 hours.  Invalid input(s): "FREET3" Anemia work up No results for input(s): "VITAMINB12", "FOLATE", "FERRITIN", "TIBC", "IRON", "RETICCTPCT" in the last 72 hours. Urinalysis    Component Value Date/Time   COLORURINE AMBER (A) 06/18/2023 2217   APPEARANCEUR TURBID (A) 06/18/2023 2217   LABSPEC 1.005 06/18/2023 2217   PHURINE 8.0 06/18/2023 2217   GLUCOSEU NEGATIVE 06/18/2023 2217   HGBUR MODERATE (A) 06/18/2023 2217   BILIRUBINUR NEGATIVE 06/18/2023 2217   BILIRUBINUR neg 05/06/2015 1421   KETONESUR NEGATIVE 06/18/2023 2217   PROTEINUR 30 (A) 06/18/2023 2217   UROBILINOGEN 1.0 05/06/2015 1421   UROBILINOGEN 0.2 12/25/2014 1206   NITRITE NEGATIVE 06/18/2023 2217   LEUKOCYTESUR LARGE (A) 06/18/2023 2217   Sepsis Labs Recent Labs  Lab 06/17/23 0447 06/18/23 0614 06/19/23 0413 06/20/23 0636  WBC 6.3 6.4 5.4 5.7   Microbiology Recent Results (from the past 240 hour(s))  Blood culture (routine x 2)     Status: None   Collection Time: 06/14/23  8:20 PM   Specimen: BLOOD  Result Value Ref Range Status   Specimen Description   Final    BLOOD LEFT ANTECUBITAL Performed at [facility], [address] [place], [place] [address]    Special Requests   Final    BOTTLES DRAWN AEROBIC AND ANAEROBIC Blood Culture adequate volume Performed at [facility], [address], [place], [place] [address]    Culture   Final    NO GROWTH 5 DAYS Performed at [facility], [address] [place], [place] [address]    Report Status 06/19/2023 FINAL  Final  Culture, blood (Routine X 2) w Reflex to ID Panel     Status: None   Collection Time: 06/15/23  3:30 AM   Specimen: BLOOD  Result Value Ref Range Status   Specimen Description   Final    BLOOD BLOOD RIGHT FOREARM Performed at [facility], [address] [place],  [place] [address]    Special Requests   Final    BOTTLES DRAWN AEROBIC AND ANAEROBIC Blood Culture adequate volume Performed at [facility], [address] [place], [place] [address]    Culture   Final    NO GROWTH 5 DAYS Performed at [facility], [address] [place], [place] [address]    Report Status 06/20/2023 FINAL  Final  Culture, blood (Routine X 2) w Reflex to ID Panel     Status: None   Collection Time: 06/15/23  6:07 AM   Specimen: BLOOD  Result Value Ref Range Status   Specimen Description BLOOD BLOOD RIGHT HAND  Final   Special Requests   Final    BOTTLES DRAWN AEROBIC AND ANAEROBIC Blood Culture adequate volume   Culture   Final    NO GROWTH 5 DAYS Performed at [facility], [address] [place], [place] [address]    Report Status 06/20/2023 FINAL  Final  Urine Culture     Status: Abnormal   Collection Time: 06/18/23 10:17 PM   Specimen: Urine, Random  Result Value Ref Range Status   Specimen Description URINE, RANDOM  Final   Special Requests   Final    NONE Reflexed from [id number] Performed at [facility], [address] [place], [place] [address]    Culture >=100,000 COLONIES/mL ENTEROCOCCUS FAECALIS (A)  Final   Report Status 06/21/2023 FINAL  Final   Organism ID, Bacteria ENTEROCOCCUS FAECALIS (A)  Final      Susceptibility   Enterococcus faecalis - MIC*    AMPICILLIN <=2 SENSITIVE Sensitive     NITROFURANTOIN <=16 SENSITIVE Sensitive     VANCOMYCIN 2 SENSITIVE Sensitive     * >=100,000 COLONIES/mL ENTEROCOCCUS FAECALIS     Time coordinating discharge: 35 minutes  SIGNED:   Glade Lloyd, MD  [facility] 06/21/2023, 10:15 AM

## 2023-06-28 ENCOUNTER — Ambulatory Visit: Payer: Medicare HMO

## 2023-07-01 ENCOUNTER — Ambulatory Visit: Payer: Medicare HMO

## 2023-07-01 ENCOUNTER — Ambulatory Visit (HOSPITAL_COMMUNITY): Payer: Medicare HMO

## 2023-07-18 DIAGNOSIS — M810 Age-related osteoporosis without current pathological fracture: Secondary | ICD-10-CM | POA: Diagnosis not present

## 2023-07-18 DIAGNOSIS — R54 Age-related physical debility: Secondary | ICD-10-CM | POA: Diagnosis not present

## 2023-07-18 DIAGNOSIS — M3390 Dermatopolymyositis, unspecified, organ involvement unspecified: Secondary | ICD-10-CM | POA: Diagnosis not present

## 2023-08-02 ENCOUNTER — Ambulatory Visit: Payer: Medicare HMO

## 2023-08-07 DIAGNOSIS — E1165 Type 2 diabetes mellitus with hyperglycemia: Secondary | ICD-10-CM | POA: Diagnosis not present

## 2023-08-07 DIAGNOSIS — Z794 Long term (current) use of insulin: Secondary | ICD-10-CM | POA: Diagnosis not present

## 2023-08-07 DIAGNOSIS — E119 Type 2 diabetes mellitus without complications: Secondary | ICD-10-CM | POA: Diagnosis not present

## 2023-09-06 ENCOUNTER — Ambulatory Visit: Payer: Medicare HMO

## 2023-09-09 ENCOUNTER — Encounter (HOSPITAL_COMMUNITY): Payer: Self-pay

## 2023-09-09 ENCOUNTER — Emergency Department (HOSPITAL_COMMUNITY)

## 2023-09-09 ENCOUNTER — Inpatient Hospital Stay (HOSPITAL_COMMUNITY)
Admission: EM | Admit: 2023-09-09 | Discharge: 2023-09-11 | DRG: 871 | Disposition: A | Discharge status: Hospice | Attending: Internal Medicine | Admitting: Internal Medicine

## 2023-09-09 ENCOUNTER — Other Ambulatory Visit: Payer: Self-pay

## 2023-09-09 DIAGNOSIS — Z90710 Acquired absence of both cervix and uterus: Secondary | ICD-10-CM

## 2023-09-09 DIAGNOSIS — J690 Pneumonitis due to inhalation of food and vomit: Secondary | ICD-10-CM | POA: Diagnosis present

## 2023-09-09 DIAGNOSIS — M810 Age-related osteoporosis without current pathological fracture: Secondary | ICD-10-CM | POA: Diagnosis present

## 2023-09-09 DIAGNOSIS — E871 Hypo-osmolality and hyponatremia: Secondary | ICD-10-CM | POA: Diagnosis present

## 2023-09-09 DIAGNOSIS — E1165 Type 2 diabetes mellitus with hyperglycemia: Secondary | ICD-10-CM | POA: Diagnosis present

## 2023-09-09 DIAGNOSIS — Z87891 Personal history of nicotine dependence: Secondary | ICD-10-CM

## 2023-09-09 DIAGNOSIS — Z515 Encounter for palliative care: Secondary | ICD-10-CM | POA: Diagnosis not present

## 2023-09-09 DIAGNOSIS — Z681 Body mass index (BMI) 19 or less, adult: Secondary | ICD-10-CM

## 2023-09-09 DIAGNOSIS — G40909 Epilepsy, unspecified, not intractable, without status epilepticus: Secondary | ICD-10-CM | POA: Diagnosis present

## 2023-09-09 DIAGNOSIS — E7800 Pure hypercholesterolemia, unspecified: Secondary | ICD-10-CM | POA: Diagnosis present

## 2023-09-09 DIAGNOSIS — Z8249 Family history of ischemic heart disease and other diseases of the circulatory system: Secondary | ICD-10-CM

## 2023-09-09 DIAGNOSIS — J189 Pneumonia, unspecified organism: Principal | ICD-10-CM | POA: Diagnosis present

## 2023-09-09 DIAGNOSIS — Z66 Do not resuscitate: Secondary | ICD-10-CM | POA: Diagnosis present

## 2023-09-09 DIAGNOSIS — M3313 Other dermatomyositis without myopathy: Secondary | ICD-10-CM | POA: Diagnosis present

## 2023-09-09 DIAGNOSIS — Z79899 Other long term (current) drug therapy: Secondary | ICD-10-CM

## 2023-09-09 DIAGNOSIS — D649 Anemia, unspecified: Secondary | ICD-10-CM | POA: Diagnosis present

## 2023-09-09 DIAGNOSIS — A419 Sepsis, unspecified organism: Principal | ICD-10-CM | POA: Diagnosis present

## 2023-09-09 DIAGNOSIS — R1312 Dysphagia, oropharyngeal phase: Secondary | ICD-10-CM | POA: Diagnosis present

## 2023-09-09 DIAGNOSIS — E1122 Type 2 diabetes mellitus with diabetic chronic kidney disease: Secondary | ICD-10-CM | POA: Diagnosis present

## 2023-09-09 DIAGNOSIS — E11649 Type 2 diabetes mellitus with hypoglycemia without coma: Secondary | ICD-10-CM | POA: Diagnosis not present

## 2023-09-09 DIAGNOSIS — E872 Acidosis, unspecified: Secondary | ICD-10-CM | POA: Diagnosis present

## 2023-09-09 DIAGNOSIS — I10 Essential (primary) hypertension: Secondary | ICD-10-CM | POA: Diagnosis present

## 2023-09-09 DIAGNOSIS — Z79890 Hormone replacement therapy (postmenopausal): Secondary | ICD-10-CM

## 2023-09-09 DIAGNOSIS — L97519 Non-pressure chronic ulcer of other part of right foot with unspecified severity: Secondary | ICD-10-CM | POA: Diagnosis present

## 2023-09-09 DIAGNOSIS — L89152 Pressure ulcer of sacral region, stage 2: Secondary | ICD-10-CM | POA: Diagnosis present

## 2023-09-09 DIAGNOSIS — J9601 Acute respiratory failure with hypoxia: Secondary | ICD-10-CM | POA: Diagnosis present

## 2023-09-09 DIAGNOSIS — E43 Unspecified severe protein-calorie malnutrition: Secondary | ICD-10-CM | POA: Diagnosis present

## 2023-09-09 DIAGNOSIS — R64 Cachexia: Secondary | ICD-10-CM | POA: Diagnosis present

## 2023-09-09 DIAGNOSIS — Z881 Allergy status to other antibiotic agents status: Secondary | ICD-10-CM

## 2023-09-09 DIAGNOSIS — Z825 Family history of asthma and other chronic lower respiratory diseases: Secondary | ICD-10-CM

## 2023-09-09 DIAGNOSIS — Z888 Allergy status to other drugs, medicaments and biological substances status: Secondary | ICD-10-CM

## 2023-09-09 DIAGNOSIS — Z794 Long term (current) use of insulin: Secondary | ICD-10-CM

## 2023-09-09 DIAGNOSIS — Z7401 Bed confinement status: Secondary | ICD-10-CM

## 2023-09-09 DIAGNOSIS — B370 Candidal stomatitis: Secondary | ICD-10-CM | POA: Diagnosis present

## 2023-09-09 DIAGNOSIS — F0390 Unspecified dementia without behavioral disturbance: Secondary | ICD-10-CM | POA: Diagnosis present

## 2023-09-09 DIAGNOSIS — Z7952 Long term (current) use of systemic steroids: Secondary | ICD-10-CM

## 2023-09-09 DIAGNOSIS — Z800 Family history of malignant neoplasm of digestive organs: Secondary | ICD-10-CM

## 2023-09-09 DIAGNOSIS — L89899 Pressure ulcer of other site, unspecified stage: Secondary | ICD-10-CM | POA: Diagnosis present

## 2023-09-09 DIAGNOSIS — Z833 Family history of diabetes mellitus: Secondary | ICD-10-CM

## 2023-09-09 DIAGNOSIS — Z7902 Long term (current) use of antithrombotics/antiplatelets: Secondary | ICD-10-CM

## 2023-09-09 DIAGNOSIS — E039 Hypothyroidism, unspecified: Secondary | ICD-10-CM | POA: Diagnosis present

## 2023-09-09 DIAGNOSIS — Z882 Allergy status to sulfonamides status: Secondary | ICD-10-CM

## 2023-09-09 LAB — APTT: aPTT: 30 s (ref 24–36)

## 2023-09-09 LAB — CBC WITH DIFFERENTIAL/PLATELET
Abs Immature Granulocytes: 0.06 10*3/uL (ref 0.00–0.07)
Basophils Absolute: 0 10*3/uL (ref 0.0–0.1)
Basophils Relative: 0 %
Eosinophils Absolute: 0 10*3/uL (ref 0.0–0.5)
Eosinophils Relative: 0 %
HCT: 33.9 % — ABNORMAL LOW (ref 36.0–46.0)
Hemoglobin: 11.1 g/dL — ABNORMAL LOW (ref 12.0–15.0)
Immature Granulocytes: 1 %
Lymphocytes Relative: 7 %
Lymphs Abs: 0.8 10*3/uL (ref 0.7–4.0)
MCH: 31.2 pg (ref 26.0–34.0)
MCHC: 32.7 g/dL (ref 30.0–36.0)
MCV: 95.2 fL (ref 80.0–100.0)
Monocytes Absolute: 0.9 10*3/uL (ref 0.1–1.0)
Monocytes Relative: 8 %
Neutro Abs: 9.6 10*3/uL — ABNORMAL HIGH (ref 1.7–7.7)
Neutrophils Relative %: 84 %
Platelets: 373 10*3/uL (ref 150–400)
RBC: 3.56 MIL/uL — ABNORMAL LOW (ref 3.87–5.11)
RDW: 14.9 % (ref 11.5–15.5)
WBC: 11.3 10*3/uL — ABNORMAL HIGH (ref 4.0–10.5)
nRBC: 0 % (ref 0.0–0.2)

## 2023-09-09 LAB — COMPREHENSIVE METABOLIC PANEL
ALT: 18 U/L (ref 0–44)
AST: 25 U/L (ref 15–41)
Albumin: 2.9 g/dL — ABNORMAL LOW (ref 3.5–5.0)
Alkaline Phosphatase: 90 U/L (ref 38–126)
Anion gap: 16 — ABNORMAL HIGH (ref 5–15)
BUN: 10 mg/dL (ref 8–23)
CO2: 22 mmol/L (ref 22–32)
Calcium: 9.9 mg/dL (ref 8.9–10.3)
Chloride: 89 mmol/L — ABNORMAL LOW (ref 98–111)
Creatinine, Ser: 0.89 mg/dL (ref 0.44–1.00)
GFR, Estimated: >60 mL/min (ref >60)
Glucose, Bld: 295 mg/dL — ABNORMAL HIGH (ref 70–99)
Potassium: 4.3 mmol/L (ref 3.5–5.1)
Sodium: 127 mmol/L — ABNORMAL LOW (ref 135–145)
Total Bilirubin: 0.8 mg/dL (ref 0.0–1.2)
Total Protein: 6.5 g/dL (ref 6.5–8.1)

## 2023-09-09 LAB — URINALYSIS, W/ REFLEX TO CULTURE (INFECTION SUSPECTED)
Bacteria, UA: NONE SEEN
Bilirubin Urine: NEGATIVE
Glucose, UA: 50 mg/dL — AB
Hgb urine dipstick: NEGATIVE
Ketones, ur: 20 mg/dL — AB
Leukocytes,Ua: NEGATIVE
Nitrite: NEGATIVE
Protein, ur: 30 mg/dL — AB
Specific Gravity, Urine: 1.013 (ref 1.005–1.030)
pH: 6 (ref 5.0–8.0)

## 2023-09-09 LAB — CBG MONITORING, ED
Glucose-Capillary: 295 mg/dL — ABNORMAL HIGH (ref 70–99)
Glucose-Capillary: 349 mg/dL — ABNORMAL HIGH (ref 70–99)

## 2023-09-09 LAB — I-STAT CG4 LACTIC ACID, ED
Lactic Acid, Venous: 3.2 mmol/L (ref 0.5–1.9)
Lactic Acid, Venous: 4.2 mmol/L (ref 0.5–1.9)

## 2023-09-09 LAB — RESP PANEL BY RT-PCR (RSV, FLU A&B, COVID)  RVPGX2
Influenza A by PCR: NEGATIVE
Influenza B by PCR: NEGATIVE
Resp Syncytial Virus by PCR: NEGATIVE
SARS Coronavirus 2 by RT PCR: NEGATIVE

## 2023-09-09 LAB — PROTIME-INR
INR: 1 (ref 0.8–1.2)
Prothrombin Time: 13.5 s (ref 11.4–15.2)

## 2023-09-09 MED ORDER — LEVOTHYROXINE SODIUM 100 MCG PO TABS
100.0000 ug | ORAL_TABLET | ORAL | Status: DC
  Administered 2023-09-10 – 2023-09-11 (×2): 100 ug via ORAL
  Filled 2023-09-09 (×2): qty 1

## 2023-09-09 MED ORDER — METRONIDAZOLE 500 MG/100ML IV SOLN
500.0000 mg | Freq: Once | INTRAVENOUS | Status: AC
  Administered 2023-09-09: 500 mg via INTRAVENOUS
  Filled 2023-09-09: qty 100

## 2023-09-09 MED ORDER — FAMOTIDINE-CA CARB-MAG HYDROX 10-800-165 MG PO CHEW: 1.0000 | CHEWABLE_TABLET | Freq: Every day | ORAL | Status: DC

## 2023-09-09 MED ORDER — CLOPIDOGREL BISULFATE 75 MG PO TABS
75.0000 mg | ORAL_TABLET | Freq: Every day | ORAL | Status: DC
  Administered 2023-09-11: 75 mg via ORAL
  Filled 2023-09-09 (×2): qty 1

## 2023-09-09 MED ORDER — METHOTREXATE 2.5 MG PO TABS
12.5000 mg | ORAL_TABLET | ORAL | Status: DC
Start: 2023-09-14 — End: 2023-09-11

## 2023-09-09 MED ORDER — PREDNISONE 5 MG PO TABS
5.0000 mg | ORAL_TABLET | Freq: Every day | ORAL | Status: DC
Start: 2023-09-10 — End: 2023-09-11
  Administered 2023-09-10 – 2023-09-11 (×2): 5 mg via ORAL
  Filled 2023-09-09 (×2): qty 1

## 2023-09-09 MED ORDER — ASPIRIN 81 MG PO TBEC: 81.0000 mg | DELAYED_RELEASE_TABLET | Freq: Every day | ORAL | Status: DC

## 2023-09-09 MED ORDER — FOLIC ACID 1 MG PO TABS: 1.0000 mg | ORAL_TABLET | Freq: Every day | ORAL | Status: DC

## 2023-09-09 MED ORDER — LACTATED RINGERS IV SOLN: INTRAVENOUS | Status: DC

## 2023-09-09 MED ORDER — SODIUM CHLORIDE 0.9 % IV SOLN
2.0000 g | Freq: Once | INTRAVENOUS | Status: AC
  Administered 2023-09-09: 2 g via INTRAVENOUS
  Filled 2023-09-09: qty 12.5

## 2023-09-09 MED ORDER — HYOSCYAMINE SULFATE 0.125 MG SL SUBL: 0.1250 mg | SUBLINGUAL_TABLET | SUBLINGUAL | Status: DC | PRN

## 2023-09-09 MED ORDER — VANCOMYCIN HCL IN DEXTROSE 1-5 GM/200ML-% IV SOLN
1000.0000 mg | Freq: Once | INTRAVENOUS | Status: AC
  Administered 2023-09-09: 1000 mg via INTRAVENOUS
  Filled 2023-09-09: qty 200

## 2023-09-09 MED ORDER — ACETAMINOPHEN 10 MG/ML IV SOLN
1000.0000 mg | Freq: Four times a day (QID) | INTRAVENOUS | Status: DC
  Administered 2023-09-09: 1000 mg via INTRAVENOUS
  Filled 2023-09-09: qty 100

## 2023-09-09 MED ORDER — LEVOTHYROXINE SODIUM 150 MCG PO TABS: 150.0000 ug | ORAL_TABLET | ORAL | Status: DC

## 2023-09-09 MED ORDER — SODIUM CHLORIDE 0.9 % IV SOLN
2.0000 g | Freq: Two times a day (BID) | INTRAVENOUS | Status: DC
  Administered 2023-09-10: 2 g via INTRAVENOUS
  Filled 2023-09-09: qty 12.5

## 2023-09-09 MED ORDER — FAMOTIDINE 20 MG PO TABS: 10.0000 mg | ORAL_TABLET | Freq: Every day | ORAL | Status: DC

## 2023-09-09 MED ORDER — LACTATED RINGERS IV BOLUS
1000.0000 mL | Freq: Once | INTRAVENOUS | Status: AC
  Administered 2023-09-09: 1000 mL via INTRAVENOUS

## 2023-09-09 MED ORDER — IOHEXOL 300 MG/ML  SOLN
80.0000 mL | Freq: Once | INTRAMUSCULAR | Status: AC | PRN
  Administered 2023-09-09: 80 mL via INTRAVENOUS

## 2023-09-09 MED ORDER — LEVETIRACETAM 500 MG PO TABS: 500.0000 mg | ORAL_TABLET | Freq: Two times a day (BID) | ORAL | Status: DC

## 2023-09-09 MED ORDER — METRONIDAZOLE 500 MG/100ML IV SOLN
500.0000 mg | Freq: Two times a day (BID) | INTRAVENOUS | Status: DC
  Administered 2023-09-10: 500 mg via INTRAVENOUS
  Filled 2023-09-09: qty 100

## 2023-09-09 MED ORDER — LEVETIRACETAM IN NACL 500 MG/100ML IV SOLN
500.0000 mg | Freq: Two times a day (BID) | INTRAVENOUS | Status: DC
  Administered 2023-09-09 – 2023-09-11 (×4): 500 mg via INTRAVENOUS
  Filled 2023-09-09 (×5): qty 100

## 2023-09-09 NOTE — Progress Notes (Signed)
Pharmacy Antibiotic Note  [name] [name] is a 89 y.o. female admitted on 09/09/2023 with a terrible cough and been sleeping a lot more, pt medical history significant for dementia mostly bedbound who had 2 strokes in 06/2023.  Pharmacy has been consulted to dose cefepime for sepsis.  Plan: Cefepime 2gm IV q12h Follow renal function and clinical course  Height: 5\' 7"  (170.2 cm) Weight: 45.4 kg (100 lb) IBW/kg (Calculated) : 61.6  Temp (24hrs), Avg:99.6 F (37.6 C), Min:98.7 F (37.1 C), Max:101.1 F (38.4 C)  Recent Labs  Lab 09/09/23 1642 09/09/23 1648 09/09/23 1856  WBC 11.3*  --   --   CREATININE 0.89  --   --   LATICACIDVEN  --  3.2* 4.2*    Estimated Creatinine Clearance: 31.9 mL/min (by C-G formula based on SCr of 0.89 mg/dL).    Allergies  Allergen Reactions   Nsaids Other (See Comments)    No NSAIDS due to kidney function- ESPECIALLY ibuprofen or Aleve   Biaxin [Clarithromycin] Hives   Statins Other (See Comments)    Muscle pain (myositis per daugher)   Bactrim [Sulfamethoxazole-Trimethoprim] Hives and Rash   Lisinopril Cough   Sulfa Antibiotics Hives, Rash and Other (See Comments)    NO sulfa-based meds!!      Thank you for allowing pharmacy to be a part of this patient's care.  Arley Phenix RPh 09/09/2023, 10:33 PM

## 2023-09-09 NOTE — Sepsis Progress Note (Signed)
Notified provider via secure chat of need to order repeat lactic acid.

## 2023-09-09 NOTE — Progress Notes (Addendum)
   This pt is active with hospice services. Our nurse followed up and was expecting to make a visit this am until whether started. This postponed her visit. She spoke to both daughter over the phone to instruct them on the needed changes of getting meds in her that were necessary such as keppra and prednisone if able. She directed them on how to crush and give meds. She also spoke of medications that were needed for SOB, pain and increased secretions if it was needed. The daughters reported that the pt was felt to be comfortable at this time. Our nurse received call around 330pm this afternoon and the daughter reported that her mother was not responding and they had called EMS- Pt was already being loaded up in the ambulance upon her return call to the family. She is a DNR at home.   We will follow while in hospital to support the pt's family through this difficult time and decline which is so shocking to them.   Norm Parcel RN [phone]

## 2023-09-09 NOTE — ED Notes (Signed)
[name] stated she thought her mother was choking, pt suctioned. Pt's O2 sat ranging 86-88% on RA, pt placed on 2lpm [place]. O2 sat remained 88%. O2 increased to 3lpm, O2 sat remained 90%. O2 increased to 4lpm [place], O2 sat ranging 90-92%.

## 2023-09-09 NOTE — ED Triage Notes (Signed)
Patient brought in by EMS due to increase in altered mental status. Patient is currently on hospice and a DNR. Patient's family reports a decrease in her cognitive status over the past few days. EMS stated patient was unable to follow commands. Pts CBG with EMS was 440's. Pt alert, eyes open spontaneous, but will not answer questions.

## 2023-09-09 NOTE — H&P (Signed)
History and Physical    Patient: [name] [name]:[id number] DOB: 1933/09/07 DOA: 09/09/2023 DOS: the patient was seen and examined on 09/09/2023 PCP: [name], MD  Patient coming from: Home  Chief Complaint:  Chief Complaint  Patient presents with   Altered Mental Status   HPI: [name] [name] is a 89 y.o. female with medical history significant for dementia mostly bedbound who had 2 strokes in 06/2023.  After that she transitioned to home hospice.  She also has a history of dermatomyositis and has been on Plaquenil and chronic prednisone, type 2 diabetes mellitus, PAD, hypothyroidism, and history of seizure. For the last 2 weeks she has had trouble with a terrible cough and been sleeping a lot more.  Over the last 24 hours she is lost her appetite and has been sweating a lot.  Today the the family could not wake her up and panicked and called EMS.  Though the patient is DNR and DNI and in hospice they would like her to have noninvasive treatment such as antibiotics and IV fluids.  Her workup in the emergency department included a CT scan of her chest abdomen and pelvis as well as head.  The head CT did not show any stroke and the CT of her chest reveals by lateral pneumonia likely aspiration. She has been given IV antibiotics and will be admitted to the hospitalist service.  She did meet sepsis criteria and is requiring 2 L O2 nasal cannula.   Review of Systems: unable to review all systems due to the inability of the patient to answer questions. Past Medical History:  Diagnosis Date   Arthritis    "knees, legs" (06/10/2016)   Ascites    Chronic kidney disease    "related to my diabetes"   Chronic lower back pain    Dementia without behavioral disturbance (HCC)    Diabetes mellitus without complication (HCC)    Eczema    GERD (gastroesophageal reflux disease)    High cholesterol    Hyperlipidemia    Hypertension    Hypothyroidism    OAB (overactive bladder)     Osteoporosis    Thyroid disease    Type II diabetes mellitus (HCC)    Urticaria    Past Surgical History:  Procedure Laterality Date   ABDOMINAL AORTOGRAM W/LOWER EXTREMITY N/A 02/08/2023   Procedure: ABDOMINAL AORTOGRAM W/LOWER EXTREMITY;  Surgeon: Chuck Hint, MD;  Location[facility] INVASIVE CV LAB;  Service: Vascular;  Laterality: N/A;   ABDOMINAL HYSTERECTOMY     KNEE ARTHROSCOPY     LAPAROTOMY N/A 12/03/2016   Procedure: EXPLORATORY LAPAROTOMY, LYSIS OF ADHESIONS;  Surgeon: Darnell Level, MD;  Location: [facility] ORS;  Service: General;  Laterality: N/A;   LOOP RECORDER INSERTION N/A 08/23/2019   Procedure: LOOP RECORDER INSERTION;  Surgeon: Marinus Maw, MD;  Location: [facility] INVASIVE CV LAB;  Service: Cardiovascular;  Laterality: N/A;   PERIPHERAL VASCULAR INTERVENTION  02/08/2023   Procedure: PERIPHERAL VASCULAR INTERVENTION;  Surgeon: Chuck Hint, MD;  Location: [facility] INVASIVE CV LAB;  Service: Vascular;;   vocal cord polyps     Social History:  reports that she has never smoked. She quit smokeless tobacco use about 40 years ago.  Her smokeless tobacco use included chew. She reports that she does not drink alcohol and does not use drugs.  Allergies  Allergen Reactions   Nsaids Other (See Comments)    No NSAIDS due to kidney function- ESPECIALLY ibuprofen or Aleve   Biaxin [Clarithromycin]  Hives   Statins Other (See Comments)    Muscle pain (myositis per daugher)   Bactrim [Sulfamethoxazole-Trimethoprim] Hives and Rash   Lisinopril Cough   Sulfa Antibiotics Hives, Rash and Other (See Comments)    NO sulfa-based meds!!     Family History  Problem Relation Age of Onset   Diabetes Mother    Hypertension Mother    Asthma Father    Diabetes Sister    Stomach cancer Sister    Diabetes Brother    Allergic rhinitis Neg Hx    Eczema Neg Hx    Urticaria Neg Hx     Prior to Admission medications   Medication Sig Start Date End Date Taking? Authorizing Provider  BAYER  LOW DOSE 81 MG EC tablet Take 81 mg by mouth in the morning. Swallow whole.   Yes [provider]  clopidogrel (PLAVIX) 75 MG tablet Take 1 tablet (75 mg total) by mouth daily with breakfast. 06/21/23  Yes Glade Lloyd, MD  ezetimibe (ZETIA) 10 MG tablet Take 1 tablet (10 mg total) by mouth at bedtime. 05/24/23 05/23/24 Yes Glade Lloyd, MD  folic acid (FOLVITE) 1 MG tablet Take 1 mg by mouth at bedtime.   Yes [provider]  hyoscyamine (LEVSIN SL) 0.125 MG SL tablet Place 0.125 mg under the tongue every 4 (four) hours as needed (to manage excessive secretions by reducing mucus production in the respiratory tract).   Yes [provider]  insulin aspart (NOVOLOG FLEXPEN) 100 UNIT/ML FlexPen Inject 2-10 Units into the skin See admin instructions. Inject 2-10 units into the skin three times a day with meals, per sliding scale:  Breakfast: BGL 80-199 = 8 units; 200-299 = 9 units; 300 or greater = 10 units Lunch: BGL 80-199 = 5 units; 200-299 = 6 units; 300 or greater = 7 units Supper/evening meal: BGL 80-199 = 2 units; 200-299 = 3 units; 300 or greater = 4 units   Yes [provider]  Insulin Glargine (BASAGLAR KWIKPEN) 100 UNIT/ML Inject 3-7 Units into the skin See admin instructions. Inject 7 units into the skin in the morning and 3 units at bedtime as needed for an elevated BGL- WHEN EATING   Yes [provider]  levETIRAcetam (KEPPRA) 500 MG tablet Take 500 mg by mouth in the morning and at bedtime. 01/11/23  Yes [provider]  methotrexate (RHEUMATREX) 2.5 MG tablet Take 1 tablet (2.5 mg total) by mouth once a week. Caution:Chemotherapy. Protect from light. Patient taking differently: Take 12.5 mg by mouth every Tuesday. Caution:Chemotherapy. Protect from light. 01/27/22  Yes Marinda Elk, MD  PEPCID COMPLETE [phone] MG chewable tablet Chew 1 tablet by mouth at bedtime.   Yes [provider]  PLAQUENIL 200 MG tablet Take 200  mg by mouth at bedtime. 10/07/22  Yes [provider]  predniSONE (DELTASONE) 5 MG tablet Take 5 mg by mouth daily with breakfast.   Yes [provider]  SYNTHROID 100 MCG tablet Take 100-150 mcg by mouth daily before breakfast. Take 100 mcg by mouth 30 minutes before breakfast on Sun/Tues/Wed/Thurs/Fri/Sat and 150 mcg on Mon   Yes [provider]  cetirizine (ZYRTEC) 10 MG tablet Take 1 tablet (10 mg total) by mouth daily as needed for allergies or rhinitis. Patient not taking: Reported on 09/09/2023 05/24/23   Glade Lloyd, MD  metoprolol tartrate (LOPRESSOR) 25 MG tablet Take 0.5 tablets (12.5 mg total) by mouth as needed (if Systolic number is over 160 for over 2  hours). Patient not taking: Reported on 09/09/2023 05/24/23   Glade Lloyd, MD  nystatin (MYCOSTATIN) 100000 UNIT/ML suspension Take 5 mLs (500,000 Units total) by mouth 4 (four) times daily. Patient not taking: Reported on 09/09/2023 05/24/23   Glade Lloyd, MD    Physical Exam: Vitals:   09/09/23 2017 09/09/23 2030 09/09/23 2045 09/09/23 2100  BP: (!) 111/51 (!) 113/49 128/61 137/60  Pulse: (!) 103 100 (!) 107 (!) 104  Resp: 15 16 18 18   Temp:      TempSrc:      SpO2: 100% 100% 100% 100%  Weight:      Height:       Physical Exam:  General: No acute distress, cachectic HEENT: Normocephalic, atraumatic, PERRL Cardiovascular: Normal rate and rhythm. Dorsalis pedal pulses not palpable Pulmonary: Normal pulmonary effort, distant breath sounds Gastrointestinal: Nondistended abdomen, soft, non-tender, hypoactive bowel sounds Musculoskeletal:No lower ext edema.  Skin: Skin is warm and dry. Sacral ulcer not examined, Black area on tip of right great toe Red Pressure areas between toes on both feet.  Neuro: The patient favors her right side but she will track and make eye contact on that side.  Noncommunicative PSYCH: Does not follow commands or communicate.  Data Reviewed:  Results for orders  placed or performed during the hospital encounter of 09/09/23 (from the past 24 hours)  CBG monitoring, ED     Status: Abnormal   Collection Time: 09/09/23  4:12 PM  Result Value Ref Range   Glucose-Capillary 295 (H) 70 - 99 mg/dL  Resp panel by RT-PCR (RSV, Flu A&B, Covid) Anterior Nasal Swab     Status: None   Collection Time: 09/09/23  4:22 PM   Specimen: Anterior Nasal Swab  Result Value Ref Range   SARS Coronavirus 2 by RT PCR NEGATIVE NEGATIVE   Influenza A by PCR NEGATIVE NEGATIVE   Influenza B by PCR NEGATIVE NEGATIVE   Resp Syncytial Virus by PCR NEGATIVE NEGATIVE  Urinalysis, w/ Reflex to Culture (Infection Suspected) -Urine, Clean Catch     Status: Abnormal   Collection Time: 09/09/23  4:22 PM  Result Value Ref Range   Specimen Source URINE, CLEAN CATCH    Color, Urine YELLOW YELLOW   APPearance CLEAR CLEAR   Specific Gravity, Urine 1.013 1.005 - 1.030   pH 6.0 5.0 - 8.0   Glucose, UA 50 (A) NEGATIVE mg/dL   Hgb urine dipstick NEGATIVE NEGATIVE   Bilirubin Urine NEGATIVE NEGATIVE   Ketones, ur 20 (A) NEGATIVE mg/dL   Protein, ur 30 (A) NEGATIVE mg/dL   Nitrite NEGATIVE NEGATIVE   Leukocytes,Ua NEGATIVE NEGATIVE   RBC / HPF 0-5 0 - 5 RBC/hpf   WBC, UA 0-5 0 - 5 WBC/hpf   Bacteria, UA NONE SEEN NONE SEEN   Squamous Epithelial / HPF 0-5 0 - 5 /HPF   Mucus PRESENT    Hyaline Casts, UA PRESENT   Comprehensive metabolic panel     Status: Abnormal   Collection Time: 09/09/23  4:42 PM  Result Value Ref Range   Sodium 127 (L) 135 - 145 mmol/L   Potassium 4.3 3.5 - 5.1 mmol/L   Chloride 89 (L) 98 - 111 mmol/L   CO2 22 22 - 32 mmol/L   Glucose, Bld 295 (H) 70 - 99 mg/dL   BUN 10 8 - 23 mg/dL   Creatinine, Ser 1.61 0.44 - 1.00 mg/dL   Calcium 9.9 8.9 - 09.6 mg/dL   Total Protein 6.5 6.5 - 8.1 g/dL  Albumin 2.9 (L) 3.5 - 5.0 g/dL   AST 25 15 - 41 U/L   ALT 18 0 - 44 U/L   Alkaline Phosphatase 90 38 - 126 U/L   Total Bilirubin 0.8 0.0 - 1.2 mg/dL   GFR, Estimated >09  >81 mL/min   Anion gap 16 (H) 5 - 15  CBC with Differential     Status: Abnormal   Collection Time: 09/09/23  4:42 PM  Result Value Ref Range   WBC 11.3 (H) 4.0 - 10.5 K/uL   RBC 3.56 (L) 3.87 - 5.11 MIL/uL   Hemoglobin 11.1 (L) 12.0 - 15.0 g/dL   HCT 19.1 (L) 47.8 - 29.5 %   MCV 95.2 80.0 - 100.0 fL   MCH 31.2 26.0 - 34.0 pg   MCHC 32.7 30.0 - 36.0 g/dL   RDW 62.1 30.8 - 65.7 %   Platelets 373 150 - 400 K/uL   nRBC 0.0 0.0 - 0.2 %   Neutrophils Relative % 84 %   Neutro Abs 9.6 (H) 1.7 - 7.7 K/uL   Lymphocytes Relative 7 %   Lymphs Abs 0.8 0.7 - 4.0 K/uL   Monocytes Relative 8 %   Monocytes Absolute 0.9 0.1 - 1.0 K/uL   Eosinophils Relative 0 %   Eosinophils Absolute 0.0 0.0 - 0.5 K/uL   Basophils Relative 0 %   Basophils Absolute 0.0 0.0 - 0.1 K/uL   Immature Granulocytes 1 %   Abs Immature Granulocytes 0.06 0.00 - 0.07 K/uL  Protime-INR     Status: None   Collection Time: 09/09/23  4:42 PM  Result Value Ref Range   Prothrombin Time 13.5 11.4 - 15.2 seconds   INR 1.0 0.8 - 1.2  APTT     Status: None   Collection Time: 09/09/23  4:42 PM  Result Value Ref Range   aPTT 30 24 - 36 seconds  I-Stat Lactic Acid, ED     Status: Abnormal   Collection Time: 09/09/23  4:48 PM  Result Value Ref Range   Lactic Acid, Venous 3.2 (HH) 0.5 - 1.9 mmol/L   Comment NOTIFIED PHYSICIAN   I-Stat Lactic Acid, ED     Status: Abnormal   Collection Time: 09/09/23  6:56 PM  Result Value Ref Range   Lactic Acid, Venous 4.2 (HH) 0.5 - 1.9 mmol/L  CBG monitoring, ED     Status: Abnormal   Collection Time: 09/09/23  8:25 PM  Result Value Ref Range   Glucose-Capillary 349 (H) 70 - 99 mg/dL     Assessment and Plan: Sepsis Aspiration Pneumonia with hypoxic respiratory failure Severe protein malnutrition Limited life expectancy  - Oxygen as needed.  Currently she is on 2 L O2 nasal cannula - She received vancomycin, Flagyl, and Maxipime initially for sepsis.  Will DC the vancomycin and add  azithromycin now that we know this is pneumonia. - She has a huge ongoing aspiration risk but that is a risk that the family has to take because it is the only way to get nutrition in her.  She is currently on a pured diet.  Will  have the dietitian see if there are any other recommendations to make her p.o. intake as safe as it can be.  - No DVT prophylaxis as she is a hospice patient.  # multiple pressure ulcers - sacrum and toes. Continue to reduce pressure as much as possible.  # DMT2 - Monitor blood sugars.  She remains on insulin.  # Dermatomyositis - Continue  prednisone and weekly methotrexate.    Advance Care Planning:   Code Status: Prior DNR/DNI per MOST form.  The patient's eldest daughter is her healthcare power of attorney.  Consults: none  Family Communication: The patient's eldest daughter was present during my evaluation  Severity of Illness: The appropriate patient status for this patient is INPATIENT. Inpatient status is judged to be reasonable and necessary in order to provide the required intensity of service to ensure the patient's safety. The patient's presenting symptoms, physical exam findings, and initial radiographic and laboratory data in the context of their chronic comorbidities is felt to place them at high risk for further clinical deterioration. Furthermore, it is not anticipated that the patient will be medically stable for discharge from the hospital within 2 midnights of admission.   * I certify that at the point of admission it is my clinical judgment that the patient will require inpatient hospital care spanning beyond 2 midnights from the point of admission due to high intensity of service, high risk for further deterioration and high frequency of surveillance required.*  Author: Buena Irish, MD 09/09/2023 9:28 PM  For on call review [email or web address].

## 2023-09-09 NOTE — Sepsis Progress Note (Signed)
 Elink will follow per sepsis protocol.

## 2023-09-09 NOTE — ED Provider Notes (Signed)
EMERGENCY DEPARTMENT AT [facility] Provider Note   CSN: [id number] Arrival date & time: 09/09/23  1600     History  Chief Complaint  Patient presents with   Altered Mental Status    [name] [name] is a 89 y.o. female.  89 year old female with past medical history of advanced dementia as well as diabetes on home hospice presenting to the emergency department today with concern for altered mental status.  Patient's had a cough now for the past 2 weeks.  She has steadily declined during that time.  She got to the point today that she was difficult to arouse and is not eating or drinking.  The patient is currently on hospice.  Family is okay with IV fluids or antibiotics but do not want any invasive procedures performed.  She was in here for further evaluation regarding this.   Altered Mental Status      Home Medications Prior to Admission medications   Medication Sig Start Date End Date Taking? Authorizing Provider  BAYER LOW DOSE 81 MG EC tablet Take 81 mg by mouth in the morning. Swallow whole.   Yes [provider]  clopidogrel (PLAVIX) 75 MG tablet Take 1 tablet (75 mg total) by mouth daily with breakfast. 06/21/23  Yes Glade Lloyd, MD  ezetimibe (ZETIA) 10 MG tablet Take 1 tablet (10 mg total) by mouth at bedtime. 05/24/23 05/23/24 Yes Glade Lloyd, MD  folic acid (FOLVITE) 1 MG tablet Take 1 mg by mouth at bedtime.   Yes [provider]  hyoscyamine (LEVSIN SL) 0.125 MG SL tablet Place 0.125 mg under the tongue every 4 (four) hours as needed (to manage excessive secretions by reducing mucus production in the respiratory tract).   Yes [provider]  insulin aspart (NOVOLOG FLEXPEN) 100 UNIT/ML FlexPen Inject 2-10 Units into the skin See admin instructions. Inject 2-10 units into the skin three times a day with meals, per sliding scale:  Breakfast: BGL 80-199 = 8 units; 200-299 = 9 units; 300 or greater = 10 units Lunch: BGL  80-199 = 5 units; 200-299 = 6 units; 300 or greater = 7 units Supper/evening meal: BGL 80-199 = 2 units; 200-299 = 3 units; 300 or greater = 4 units   Yes [provider]  Insulin Glargine (BASAGLAR KWIKPEN) 100 UNIT/ML Inject 3-7 Units into the skin See admin instructions. Inject 7 units into the skin in the morning and 3 units at bedtime as needed for an elevated BGL- WHEN EATING   Yes [provider]  levETIRAcetam (KEPPRA) 500 MG tablet Take 500 mg by mouth in the morning and at bedtime. 01/11/23  Yes [provider]  methotrexate (RHEUMATREX) 2.5 MG tablet Take 1 tablet (2.5 mg total) by mouth once a week. Caution:Chemotherapy. Protect from light. Patient taking differently: Take 12.5 mg by mouth every Tuesday. Caution:Chemotherapy. Protect from light. 01/27/22  Yes Marinda Elk, MD  PEPCID COMPLETE [phone] MG chewable tablet Chew 1 tablet by mouth at bedtime.   Yes [provider]  PLAQUENIL 200 MG tablet Take 200 mg by mouth at bedtime. 10/07/22  Yes [provider]  predniSONE (DELTASONE) 5 MG tablet Take 5 mg by mouth daily with breakfast.   Yes [provider]  SYNTHROID 100 MCG tablet Take 100-150 mcg by mouth daily before breakfast. Take 100 mcg by mouth 30 minutes before breakfast on Sun/Tues/Wed/Thurs/Fri/Sat and 150 mcg on Mon   Yes [provider]  cetirizine (ZYRTEC) 10 MG  tablet Take 1 tablet (10 mg total) by mouth daily as needed for allergies or rhinitis. Patient not taking: Reported on 09/09/2023 05/24/23   Glade Lloyd, MD  metoprolol tartrate (LOPRESSOR) 25 MG tablet Take 0.5 tablets (12.5 mg total) by mouth as needed (if Systolic number is over 160 for over 2 hours). Patient not taking: Reported on 09/09/2023 05/24/23   Glade Lloyd, MD  nystatin (MYCOSTATIN) 100000 UNIT/ML suspension Take 5 mLs (500,000 Units total) by mouth 4 (four) times daily. Patient not taking: Reported on 09/09/2023 05/24/23   Glade Lloyd, MD      Allergies    Nsaids, Biaxin [clarithromycin], Statins, Bactrim [sulfamethoxazole-trimethoprim], Lisinopril, and Sulfa antibiotics    Review of Systems   Review of Systems  Reason unable to perform ROS: Dementia, history taken from daughter.    Physical Exam Updated Vital Signs BP (!) 111/51   Pulse (!) 103   Temp 99 F (37.2 C) (Rectal)   Resp 15   Ht 5\' 7"  (1.702 m)   Wt 45.4 kg   SpO2 100%   BMI 15.66 kg/m  Physical Exam Vitals and nursing note reviewed.   Gen: Somnolent, will respond to painful stimuli but will not respond to verbal stimuli Eyes: PERRL, EOMI HEENT: no oropharyngeal swelling Neck: trachea midline, no meningismus Resp: Diminished throughout Card: RRR, no murmurs, rubs, or gallops Abd: nontender, nondistended Extremities: no calf tenderness, no edema Vascular: 2+ radial pulses bilaterally, 2+ DP pulses bilaterally Neuro: The patient will grimace and move extremities to painful stimuli Skin: no rashes Psyc: acting appropriately   ED Results / Procedures / Treatments   Labs (all labs ordered are listed, but only abnormal results are displayed) Labs Reviewed  COMPREHENSIVE METABOLIC PANEL - Abnormal; Notable for the following components:      Result Value   Sodium 127 (*)    Chloride 89 (*)    Glucose, Bld 295 (*)    Albumin 2.9 (*)    Anion gap 16 (*)    All other components within normal limits  CBC WITH DIFFERENTIAL/PLATELET - Abnormal; Notable for the following components:   WBC 11.3 (*)    RBC 3.56 (*)    Hemoglobin 11.1 (*)    HCT 33.9 (*)    Neutro Abs 9.6 (*)    All other components within normal limits  URINALYSIS, W/ REFLEX TO CULTURE (INFECTION SUSPECTED) - Abnormal; Notable for the following components:   Glucose, UA 50 (*)    Ketones, ur 20 (*)    Protein, ur 30 (*)    All other components within normal limits  I-STAT CG4 LACTIC ACID, ED - Abnormal; Notable for the following components:   Lactic Acid, Venous  3.2 (*)    All other components within normal limits  CBG MONITORING, ED - Abnormal; Notable for the following components:   Glucose-Capillary 295 (*)    All other components within normal limits  I-STAT CG4 LACTIC ACID, ED - Abnormal; Notable for the following components:   Lactic Acid, Venous 4.2 (*)    All other components within normal limits  CBG MONITORING, ED - Abnormal; Notable for the following components:   Glucose-Capillary 349 (*)    All other components within normal limits  RESP PANEL BY RT-PCR (RSV, FLU A&B, COVID)  RVPGX2  CULTURE, BLOOD (ROUTINE X 2)  CULTURE, BLOOD (ROUTINE X 2)  PROTIME-INR  APTT    EKG EKG Interpretation Date/Time:  Thursday September 09 2023 16:17:22 EST Ventricular Rate:  109 PR  Interval:  192 QRS Duration:  99 QT Interval:  353 QTC Calculation: 476 R Axis:   -67  Text Interpretation: Sinus tachycardia Atrial premature complex LAD, consider left anterior fascicular block Low voltage, extremity leads Minimal ST depression, inferior leads Confirmed by Beckey Downing [phone]) on 09/09/2023 4:42:32 PM  Radiology CT CHEST ABDOMEN PELVIS W CONTRAST Result Date: 09/09/2023 CLINICAL DATA:  Altered mental status, sepsis EXAM: CT CHEST, ABDOMEN, AND PELVIS WITH CONTRAST TECHNIQUE: Multidetector CT imaging of the chest, abdomen and pelvis was performed following the standard protocol during bolus administration of intravenous contrast. RADIATION DOSE REDUCTION: This exam was performed according to the departmental dose-optimization program which includes automated exposure control, adjustment of the mA and/or kV according to patient size and/or use of iterative reconstruction technique. CONTRAST:  80mL OMNIPAQUE IOHEXOL 300 MG/ML  SOLN COMPARISON:  CTA chest CT abdomen/pelvis dated 05/18/2023 FINDINGS: CT CHEST FINDINGS Cardiovascular: The heart is normal in size. No pericardial effusion. No evidence of thoracic aortic aneurysm. Atherosclerotic calcifications of  the arch. Moderate brain sclerosis of the LAD and right coronary artery. Mediastinum/Nodes: No suspicious mediastinal lymphadenopathy. Lungs/Pleura: Biapical pleural-parenchymal scarring. Faint ground-glass/patchy opacities in the posterior right upper lobe and right greater than left lower lobes, favoring multifocal pneumonia. Associated debris in the right lower lobe bronchus (series 5/image 37), suggesting aspiration. No pleural effusion or pneumothorax. Musculoskeletal: Visualized osseous structures are within normal limits. CT ABDOMEN PELVIS FINDINGS Hepatobiliary: Liver is within normal limits, noting focal fat/altered perfusion along the falciform ligament. Gallbladder is unremarkable. No intrahepatic or extrahepatic ductal dilatation. Pancreas: Within normal limits. Spleen: Within normal limits. Adrenals/Urinary Tract: Adrenal glands are within normal limits. 2.7 cm simple left lower pole renal cyst (series 5/image 31), benign (Bosniak I). No follow-up is recommended. Right kidney is within normal limits. No hydronephrosis. Bladder is underdistended but unremarkable. Stomach/Bowel: Stomach is within normal limits. No evidence of bowel obstruction. Appendix is not discretely visualized. No colonic wall thickening or inflammatory changes. Mildly increased rectal stool burden. Vascular/Lymphatic: No evidence of abdominal aortic aneurysm. Atherosclerotic calcifications of the abdominal aorta and branch vessels, although vessels remain patent. No suspicious abdominopelvic lymphadenopathy. Reproductive: Status post hysterectomy. No adnexal masses. Other: Small volume pelvic ascites. Musculoskeletal: Mild degenerative changes of the lumbar spine. IMPRESSION: Multifocal pneumonia, right greater than left, with suspected aspiration. Additional stable ancillary findings as above. Electronically Signed   By: Charline Bills M.D.   On: 09/09/2023 20:37   CT Head Wo Contrast Result Date: 09/09/2023 CLINICAL DATA:   Memory loss, altered mental status EXAM: CT HEAD WITHOUT CONTRAST TECHNIQUE: Contiguous axial images were obtained from the base of the skull through the vertex without intravenous contrast. RADIATION DOSE REDUCTION: This exam was performed according to the departmental dose-optimization program which includes automated exposure control, adjustment of the mA and/or kV according to patient size and/or use of iterative reconstruction technique. COMPARISON:  06/15/2023 FINDINGS: Brain: No evidence of acute infarction, hemorrhage, extra-axial collection or mass lesion/mass effect. Global cortical and central atrophy. Secondary ventriculomegaly, stable. Subcortical white matter and periventricular small vessel ischemic changes. Vascular: Intracranial atherosclerosis. Skull: Normal. Negative for fracture or focal lesion. Sinuses/Orbits: The visualized paranasal sinuses are essentially clear. The mastoid air cells are unopacified. Other: None. IMPRESSION: No acute intracranial abnormality. Atrophy with small vessel ischemic changes. Electronically Signed   By: Charline Bills M.D.   On: 09/09/2023 20:28   DG Chest Port 1 View Result Date: 09/09/2023 CLINICAL DATA:  Questionable sepsis. EXAM: PORTABLE CHEST 1 VIEW COMPARISON:  Chest  radiograph dated 06/14/2023. FINDINGS: No focal consolidation, pleural effusion, pneumothorax. Bilateral nipple shadows noted. The cardiac silhouette is within normal limits. Atherosclerotic calcification of the aorta. A loop recorder device. No acute osseous pathology. IMPRESSION: No active disease. Electronically Signed   By: Elgie Collard M.D.   On: 09/09/2023 16:42    Procedures Procedures    Medications Ordered in ED Medications  lactated ringers infusion (0 mLs Intravenous Paused 09/09/23 2025)  acetaminophen (OFIRMEV) IV 1,000 mg (0 mg Intravenous Stopped 09/09/23 1744)  levETIRAcetam (KEPPRA) IVPB 500 mg/100 mL premix (0 mg Intravenous Stopped 09/09/23 1739)  ceFEPIme  (MAXIPIME) 2 g in sodium chloride 0.9 % 100 mL IVPB (0 g Intravenous Stopped 09/09/23 1735)  metroNIDAZOLE (FLAGYL) IVPB 500 mg (0 mg Intravenous Stopped 09/09/23 1841)  vancomycin (VANCOCIN) IVPB 1000 mg/200 mL premix (0 mg Intravenous Stopped 09/09/23 1847)  lactated ringers bolus 1,000 mL (0 mLs Intravenous Stopped 09/09/23 1854)  lactated ringers bolus 1,000 mL (1,000 mLs Intravenous New Bag/Given 09/09/23 2024)  iohexol (OMNIPAQUE) 300 MG/ML solution 80 mL (80 mLs Intravenous Contrast Given 09/09/23 1959)    ED Course/ Medical Decision Making/ A&P                                 Medical Decision Making 89 year old female with past medical history of diabetes and advanced dementia presenting to the emergency department today with altered mental status, fever, and symptoms consistent with likely pneumonia.  Initiate a sepsis workup on the patient here.  Will give her IV fluids.  Will hold off on the full sepsis fluids as the patient's blood pressures are stable at this time.  I will give patient IV antibiotics empirically.  She was covered with vancomycin and cefepime prior to obtaining more history from her daughter as she was febrile here on arrival.  I will reevaluate for ultimate disposition.  Also obtain RSV/COVID/flu swab to evaluate for viral etiologies.  The patient's initial lactate was mildly elevated.  On repeat this was greater than 4.  Patient is ordered additional liter of fluids.  This would give her a total of more than 30 mL/kg.  Her x-ray is negative.  She does have a leukocytosis.  CT scans are ordered to evaluate for cause of infection.  Viral testing is negative.  CT scan does show multifocal pneumonia.  Hospice hospital service for admission.  Amount and/or Complexity of Data Reviewed Labs: ordered. Radiology: ordered.  Risk Prescription drug management.           Final Clinical Impression(s) / ED Diagnoses Final diagnoses:  Multifocal pneumonia    Rx / DC  Orders ED Discharge Orders     None         Durwin Glaze, MD 09/09/23 2044

## 2023-09-10 DIAGNOSIS — J690 Pneumonitis due to inhalation of food and vomit: Secondary | ICD-10-CM | POA: Diagnosis not present

## 2023-09-10 LAB — LACTIC ACID, PLASMA: Lactic Acid, Venous: 3 mmol/L (ref 0.5–1.9)

## 2023-09-10 LAB — CBG MONITORING, ED
Glucose-Capillary: 410 mg/dL — ABNORMAL HIGH (ref 70–99)
Glucose-Capillary: 302 mg/dL — ABNORMAL HIGH (ref 70–99)

## 2023-09-10 LAB — CBC
HCT: 27.4 % — ABNORMAL LOW (ref 36.0–46.0)
Hemoglobin: 8.8 g/dL — ABNORMAL LOW (ref 12.0–15.0)
MCH: 32.1 pg (ref 26.0–34.0)
MCHC: 32.1 g/dL (ref 30.0–36.0)
MCV: 100 fL (ref 80.0–100.0)
Platelets: 304 10*3/uL (ref 150–400)
RBC: 2.74 MIL/uL — ABNORMAL LOW (ref 3.87–5.11)
RDW: 15.1 % (ref 11.5–15.5)
WBC: 12.6 10*3/uL — ABNORMAL HIGH (ref 4.0–10.5)
nRBC: 0 % (ref 0.0–0.2)

## 2023-09-10 LAB — GLUCOSE, CAPILLARY
Glucose-Capillary: 196 mg/dL — ABNORMAL HIGH (ref 70–99)
Glucose-Capillary: 200 mg/dL — ABNORMAL HIGH (ref 70–99)
Glucose-Capillary: 231 mg/dL — ABNORMAL HIGH (ref 70–99)
Glucose-Capillary: 436 mg/dL — ABNORMAL HIGH (ref 70–99)

## 2023-09-10 LAB — HIV ANTIBODY (ROUTINE TESTING W REFLEX): HIV Screen 4th Generation wRfx: NONREACTIVE

## 2023-09-10 LAB — BASIC METABOLIC PANEL
Anion gap: 15 (ref 5–15)
BUN: 17 mg/dL (ref 8–23)
CO2: 19 mmol/L — ABNORMAL LOW (ref 22–32)
Calcium: 8.8 mg/dL — ABNORMAL LOW (ref 8.9–10.3)
Chloride: 92 mmol/L — ABNORMAL LOW (ref 98–111)
Creatinine, Ser: 0.81 mg/dL (ref 0.44–1.00)
GFR, Estimated: >60 mL/min (ref >60)
Glucose, Bld: 324 mg/dL — ABNORMAL HIGH (ref 70–99)
Potassium: 3.5 mmol/L (ref 3.5–5.1)
Sodium: 126 mmol/L — ABNORMAL LOW (ref 135–145)

## 2023-09-10 LAB — TSH: TSH: 0.188 u[IU]/mL — ABNORMAL LOW (ref 0.350–4.500)

## 2023-09-10 MED ORDER — INSULIN ASPART 100 UNIT/ML IJ SOLN
0.0000 [IU] | Freq: Three times a day (TID) | INTRAMUSCULAR | Status: DC
  Administered 2023-09-10: 7 [IU] via SUBCUTANEOUS
  Filled 2023-09-10: qty 0.09

## 2023-09-10 MED ORDER — SODIUM CHLORIDE 0.9 % IV SOLN
3.0000 g | Freq: Two times a day (BID) | INTRAVENOUS | Status: DC
  Administered 2023-09-10 – 2023-09-11 (×3): 3 g via INTRAVENOUS
  Filled 2023-09-10 (×3): qty 8

## 2023-09-10 MED ORDER — INSULIN GLARGINE 100 UNIT/ML ~~LOC~~ SOLN
5.0000 [IU] | Freq: Every day | SUBCUTANEOUS | Status: DC
  Filled 2023-09-10: qty 0.05

## 2023-09-10 MED ORDER — INSULIN GLARGINE-YFGN 100 UNIT/ML ~~LOC~~ SOLN
10.0000 [IU] | Freq: Every day | SUBCUTANEOUS | Status: DC
  Filled 2023-09-10: qty 0.1

## 2023-09-10 MED ORDER — INSULIN GLARGINE-YFGN 100 UNIT/ML ~~LOC~~ SOLN
5.0000 [IU] | Freq: Every day | SUBCUTANEOUS | Status: DC
  Filled 2023-09-10: qty 0.05

## 2023-09-10 MED ORDER — SODIUM CHLORIDE 0.9 % IV SOLN: INTRAVENOUS | Status: DC

## 2023-09-10 MED ORDER — LACTATED RINGERS IV SOLN: INTRAVENOUS | Status: DC

## 2023-09-10 MED ORDER — INSULIN GLARGINE-YFGN 100 UNIT/ML ~~LOC~~ SOPN
5.0000 [IU] | PEN_INJECTOR | Freq: Once | SUBCUTANEOUS | Status: DC
  Filled 2023-09-10: qty 3

## 2023-09-10 MED ORDER — INSULIN ASPART 100 UNIT/ML IJ SOLN: 0.0000 [IU] | Freq: Every day | INTRAMUSCULAR | Status: DC

## 2023-09-10 MED ORDER — INSULIN GLARGINE-YFGN 100 UNIT/ML ~~LOC~~ SOLN
5.0000 [IU] | Freq: Once | SUBCUTANEOUS | Status: AC
  Administered 2023-09-10: 5 [IU] via SUBCUTANEOUS
  Filled 2023-09-10 (×2): qty 0.05

## 2023-09-10 MED ORDER — INSULIN ASPART 100 UNIT/ML IJ SOLN
0.0000 [IU] | Freq: Three times a day (TID) | INTRAMUSCULAR | Status: DC
  Administered 2023-09-10: 15 [IU] via SUBCUTANEOUS

## 2023-09-10 MED ORDER — ENOXAPARIN SODIUM 30 MG/0.3ML IJ SOSY
30.0000 mg | PREFILLED_SYRINGE | INTRAMUSCULAR | Status: DC
  Administered 2023-09-10 – 2023-09-11 (×2): 30 mg via SUBCUTANEOUS
  Filled 2023-09-10 (×2): qty 0.3

## 2023-09-10 MED ORDER — INSULIN ASPART 100 UNIT/ML IJ SOLN
0.0000 [IU] | Freq: Every day | INTRAMUSCULAR | Status: DC
  Administered 2023-09-10: 5 [IU] via SUBCUTANEOUS
  Filled 2023-09-10: qty 0.05

## 2023-09-10 NOTE — ED Notes (Signed)
Pt was cleaned up and new brief put on.

## 2023-09-10 NOTE — ED Notes (Signed)
Daughter requested to check patient's blood sugar.

## 2023-09-10 NOTE — Consult Note (Addendum)
WOC Nurse Consult Note: Reason for Consult: Consult requested for sacrum and bilat toes.  Wound type: Sacrum with Stage 2 pressure injury; 2X.8X.2cm, red and moist Left tip of great toe with dry eschar, 1X.5cm Right tip of great toe with dry eschar, 1X1cm.  No odor, drainage, or fluctuance. Heels with intact skin. It is best practice to leave dry stable eschar intact to BLE.  Pressure Injury POA: Yes Dressing procedure/placement/frequency: Topical treatment orders provided for bedside nurses to perform as follows:  1. Foam dressing to sacrum, change Q 3 days or PRN soiling 2. Paint bilat great toe tips q day with betadine and leave open to air. Please re-consult if further assistance is needed.  Thank-you,  Cammie Mcgee MSN, RN, CWOCN, [place], CNS [phone]

## 2023-09-10 NOTE — Progress Notes (Signed)
PHARMACY NOTE -  Unasyn  Pharmacy has been assisting with dosing of Unasyn for aspiration PNA. Although SCr < 1, CrCl ~30 d/t age and weight Given borderline CrCl for Unasyn adjustment and noted hx seizures on Keppra, will use renally-adjusted dose of 3g IV q12 hr CrCl is unlikely to improve significantly given above considerations, thus need for future dose adjustment is highly unlikely  Pharmacy will sign off, following peripherally for culture results, dose adjustments, and length of therapy. Please reconsult if a change in clinical status warrants re-evaluation of dosage.  Bernadene Person, PharmD, BCPS [phone] 09/10/2023, 9:04 AM

## 2023-09-10 NOTE — TOC Initial Note (Addendum)
Transition of Care [facility]) - Initial/Assessment Note    Patient Details  Name: [name] [name] MRN: [id number] Date of Birth: 11-27-1933  [facility] CM/SW Contact:    Adrian Prows, RN Phone Number: 09/10/2023, 3:58 PM  Clinical Narrative:                 TOC for d/c planning; pt has dementia; spoke w/ dtr [name] in room; she can be contacted at [phone]; she says pt is from home; she plans for pt to return home at d/c; pt will need ambulance transport; Ms Jeannette How verifies insurance/PCP; she denies pt experiencing SDOH risks; pt has hospital bed, cane, walker, and wheel chair; she does not have HH services or home oxygen; pt is active w/ [facility], and her dtr requests to con't these services at d/c; Norm Parcel, Hospital Liaison for agency notfied; she says agency can con't services at d/c; agency contact info placed in follow up provider section of d/c instructions; TOC will follow.  Expected Discharge Plan: Home w Hospice Care Barriers to Discharge: Continued Medical Work up   Patient Goals and CMS Choice Patient states their goals for this hospitalization and ongoing recovery are:: pt will return home w/ hospice CMS Medicare.gov Compare Post Acute Care list provided to:: Patient Represenative (must comment) Financial risk analyst (dtr))        Expected Discharge Plan and Services   Discharge Planning Services: CM Consult Post Acute Care Choice: Resumption of Svcs/PTA Provider (home w/ [facility]) Living arrangements for the past 2 months: Single Family Home                 DME Arranged: N/A DME Agency: NA       HH Arranged: NA HH Agency: NA        Prior Living Arrangements/Services Living arrangements for the past 2 months: Single Family Home Lives with:: Adult Children Patient language and need for interpreter reviewed:: Yes Do you feel safe going back to the place where you live?: Yes      Need for Family  Participation in Patient Care: Yes (Comment) Care giver support system in place?: Yes (comment) Current home services: DME (hospital bed , cane, walker, wheelchair) Criminal Activity/Legal Involvement Pertinent to Current Situation/Hospitalization: No - Comment as needed  Activities of Daily Living   ADL Screening (condition at time of admission) Independently performs ADLs?: No Does the patient have a NEW difficulty with bathing/dressing/toileting/self-feeding that is expected to last >3 days?: No Does the patient have a NEW difficulty with getting in/out of bed, walking, or climbing stairs that is expected to last >3 days?: No Does the patient have a NEW difficulty with communication that is expected to last >3 days?: Yes (Initiates electronic notice to provider for possible SLP consult) Is the patient deaf or have difficulty hearing?: Yes Does the patient have difficulty seeing, even when wearing glasses/contacts?: No Does the patient have difficulty concentrating, remembering, or making decisions?: Yes  Permission Sought/Granted Permission sought to share information with : Case Manager Permission granted to share information with : Yes, Verbal Permission Granted  Share Information with NAME: Case Manager     Permission granted to share info w Relationship: [name] (dtr) [phone]     Emotional Assessment Appearance:: Appears stated age Attitude/Demeanor/Rapport: Unable to Assess Affect (typically observed): Unable to Assess Orientation: :  (unable to assess) Alcohol / Substance Use: Not Applicable Psych Involvement: No (comment)  Admission diagnosis:  Aspiration  pneumonia (HCC) [J69.0] Multifocal pneumonia [J18.9] Patient Active Problem List   Diagnosis Date Noted   Aspiration pneumonia (HCC) 09/09/2023   History of dementia 06/14/2023   Chronic ulcer of right great toe (HCC) 06/14/2023   Multifocal pneumonia 05/18/2023   Acute metabolic encephalopathy  04/30/2023   Insulin dependent type 2 diabetes mellitus (HCC) 04/30/2023   Macrocytic anemia 04/30/2023   CAP (community acquired pneumonia) 03/16/2023   COVID-19 02/24/2023   Weakness 02/24/2023   AMS (altered mental status) 02/15/2023   Paraparesis (HCC) 01/04/2023   Other specified abnormal findings of blood chemistry 01/04/2023   Cervical radiculitis 01/04/2023   Cervical spinal stenosis 01/04/2023   UTI (urinary tract infection) 10/03/2022   Sepsis due to urinary tract infection (HCC) 10/02/2022   Normocytic anemia 07/27/2022   Dementia without behavioral disturbance (HCC) 07/27/2022   Malnutrition of moderate degree 05/11/2022   PNA (pneumonia) 01/19/2022   Syncope 01/13/2022   Syncope and collapse 09/13/2021   Dermatomyositis (HCC) 09/13/2021   Poor appetite 09/13/2021   Hyponatremia 09/13/2021   Uncontrolled diabetes mellitus with hypoglycemia without coma, with long-term current use of insulin (HCC) 09/13/2021   Sacral ulcer (HCC) 09/13/2021   Oropharyngeal dysphagia 05/15/2021   Allergic conjunctivitis of both eyes 03/15/2020   PVD (peripheral vascular disease) (HCC) 10/24/2019   Malnutrition (HCC) 10/10/2019   History of seizure 10/08/2019   Right shoulder pain 08/21/2019   Leukocytosis 08/21/2019   Acute encephalopathy 08/18/2019   Enterocolitis 08/18/2019   Hyperglycemia 08/18/2019   Chronic kidney disease    SBO (small bowel obstruction) s/p lysis of adhesions 12/03/2016 12/04/2016   Acute urinary retention 11/27/2016   Ascites 11/27/2016   Abdominal distension    Facial droop    Hyperkalemia 11/25/2016   Sepsis (HCC) 11/25/2016   Delirium 11/25/2016   Age-related osteoporosis without current pathological fracture 10/01/2016   GERD (gastroesophageal reflux disease) 06/10/2016   Insomnia 06/10/2016   Viral gastroenteritis    Right hip pain 05/06/2015   Vaginal atrophy 11/01/2013   ECZEMA 07/24/2008   INTERMITTENT VERTIGO 07/24/2008   Constipation  11/16/2007   INSOMNIA 08/12/2007   Oral thrush 08/03/2007   Essential hypertension 02/14/2007   Allergic rhinitis due to allergen 02/14/2007   Hypothyroidism 01/26/2007   Uncontrolled type 2 diabetes mellitus with hypoglycemia, with long-term current use of insulin (HCC) 01/26/2007   GERD 01/26/2007   DEGENERATIVE JOINT DISEASE 01/26/2007   PCP:  [name], MD Pharmacy:   [id number] - [place], [place] - [address] AT [facility] OF [address] [place] [address] Phone: [phone] Fax: [phone]     Social Drivers of Health (SDOH) Social History: SDOH Screenings   Food Insecurity: No Food Insecurity (09/10/2023)  Housing: Low Risk  (09/10/2023)  Transportation Needs: No Transportation Needs (09/10/2023)  Utilities: Not At Risk (09/10/2023)  Social Connections: Patient Unable To Answer (09/10/2023)  Tobacco Use: Medium Risk (09/09/2023)   SDOH Interventions: Food Insecurity Interventions: Intervention Not Indicated, Inpatient TOC Housing Interventions: Intervention Not Indicated, Inpatient TOC Utilities Interventions: Intervention Not Indicated, Inpatient TOC   Readmission Risk Interventions    09/10/2023    3:52 PM 06/21/2023   11:59 AM 05/24/2023   12:19 PM  Readmission Risk Prevention Plan  Transportation Screening Complete Complete Complete  Medication Review (RN Care Manager) Complete  Complete  PCP or Specialist appointment within 3-5 days of discharge Complete  Complete  HRI or Home Care Consult Complete Complete Complete  SW  Recovery Care/Counseling Consult Complete Complete Complete  Palliative Care Screening Complete Complete Complete  Skilled Nursing Facility Not Applicable Not Applicable Not Applicable

## 2023-09-10 NOTE — ED Notes (Signed)
[name] back in the room

## 2023-09-10 NOTE — ED Notes (Signed)
[name] departed at this time and states that she will be back in the morning.

## 2023-09-10 NOTE — Inpatient Diabetes Management (Signed)
Inpatient Diabetes Program Recommendations  AACE/ADA: New Consensus Statement on Inpatient Glycemic Control (2015)  Target Ranges:  Prepandial:   less than 140 mg/dL      Peak postprandial:   less than 180 mg/dL (1-2 hours)      Critically ill patients:  140 - 180 mg/dL   Lab Results  Component Value Date   GLUCAP 302 (H) 09/10/2023   HGBA1C 8.5 (H) 06/15/2023    Review of Glycemic Control  Latest Reference Range & Units 09/09/23 16:12 09/09/23 20:25 09/10/23 03:34 09/10/23 07:34  Glucose-Capillary 70 - 99 mg/dL 914 (H) 782 (H) 956 (H) 302 (H)   Diabetes history: DM 2 Outpatient Diabetes medications:  Novolog 2-10 units tid with meals Basaglar 7 units q AM and Basaglar 3 units q PM Current orders for Inpatient glycemic control:  Novolog 0-9 units tid with meals and HS  Inpatient Diabetes Program Recommendations:    May consider adding Semglee 5 units daily.   Thanks  Lorenza Cambridge, RN, BC-ADM Inpatient Diabetes Coordinator Pager [phone]

## 2023-09-10 NOTE — Progress Notes (Signed)
PROGRESS NOTE  [name] [name]  ZOX:[id number] DOB: 1934-06-28 DOA: 09/09/2023 PCP: [name], MD   Brief Narrative: Patient is 89 year old female with history of dementia, bedbound status, strokes, currently on home hospice, type 2 diabetes, PAD, hypothyroidism presented from home with complaint of cough, increased lethargy, low appetite.  Though the patient is enrolled on hospice, family receptive for antibiotics and IV fluid.  On presentation, CT head did not show any acute findings but chest CT showed multifocal pneumonia, right greater than left, with suspected aspiration, likely from aspiration.  Started antibiotics.  Palliative care also consulted for goals of care.  Speech therapy recommended dysphagia 1 diet  Assessment & Plan:  Principal Problem:   Aspiration pneumonia (HCC) Active Problems:   Oral thrush   Oropharyngeal dysphagia   Uncontrolled type 2 diabetes mellitus with hypoglycemia, with long-term current use of insulin (HCC)   Hypothyroidism   Dermatomyositis (HCC)   Dementia without behavioral disturbance (HCC)   Chronic ulcer of right great toe (HCC)   Acute hypoxic respiratory failure secondary to aspiration pneumonia: Aspiration pneumonia is the most likely recurrent problem.  Speech therapy consulted.  Started on dysphagia 1 .continue Unasyn.  Respiratory status looks significantly improved today.  On room air.  Not in any current respiratory distress.  Sepsis: Presented with mild leukocytosis, elevated lactate.  Continue gentle IV fluid, current antibiotics.  Follow-up cultures  Hyponatremia: This is most likely contributed by hyperglycemia as well.  Continue gentle Normal Saline today  Diabetes type 2: Continue current insulin regimen.  Monitor blood sugars.  Diabetic coordinator following  History of dermatomyositis: On prednisone, weekly methotrexate  Hypothyroidism: On Synthyroid  History of seizure disorder: On  Keppra at home  Normocytic anemia:  Hemoglobin in the range of 8 today, continue monitoring.   Multiple pressure ulcers: Wound care will be consulted  Goals of care: Elderly patient with recurrent aspiration pneumonia.  Currently enrolled with outpatient hospice.  Currently on comfort care but will continue antibiotics and IV fluid.  We have consulted palliative care for goals of care discussion.  If she does not improve, she might be a candidate for comfort care.  CODE STATUS DNR.         DVT prophylaxis:Lovenox     Code Status: Limited: Do not attempt resuscitation (DNR) -DNR-LIMITED -Do Not Intubate/DNI   Family Communication: Discussed with daughter at bedside on 2/21  Patient status:Inpatient  Patient is from :home  Anticipated discharge WJ:XBJY  Estimated DC date:1-2 days   Consultants: None  Procedures:None  Antimicrobials:  Anti-infectives (From admission, onward)    Start     Dose/Rate Route Frequency Ordered Stop   09/10/23 0400  metroNIDAZOLE (FLAGYL) IVPB 500 mg        500 mg 100 mL/hr over 60 Minutes Intravenous Every 12 hours 09/09/23 2226     09/10/23 0400  ceFEPIme (MAXIPIME) 2 g in sodium chloride 0.9 % 100 mL IVPB        2 g 200 mL/hr over 30 Minutes Intravenous Every 12 hours 09/09/23 2231     09/09/23 1630  ceFEPIme (MAXIPIME) 2 g in sodium chloride 0.9 % 100 mL IVPB        2 g 200 mL/hr over 30 Minutes Intravenous  Once 09/09/23 1622 09/09/23 1735   09/09/23 1630  metroNIDAZOLE (FLAGYL) IVPB 500 mg        500 mg 100 mL/hr over 60 Minutes Intravenous  Once 09/09/23 1622 09/09/23 1841   09/09/23 1630  vancomycin (  VANCOCIN) IVPB 1000 mg/200 mL premix        1,000 mg 200 mL/hr over 60 Minutes Intravenous  Once 09/09/23 1622 09/09/23 1847       Subjective: Patient seen and examined at bedside today.  Hemodynamically stable.  Overall comfortable.  Lying in bed.  Appears very weak and deconditioned but as per the daughter, she looks better today.  Not coughing that much.  On room  air.  Objective: Vitals:   09/10/23 0350 09/10/23 0500 09/10/23 0600 09/10/23 0700  BP: (!) 148/70 130/63 133/67 (!) 140/58  Pulse: 95 (!) 103 98 98  Resp: 14 17 16 17   Temp: 97.7 F (36.5 C)   98 F (36.7 C)  TempSrc: Axillary     SpO2: 100% 100% 100% 98%  Weight:      Height:        Intake/Output Summary (Last 24 hours) at 09/10/2023 0817 Last data filed at 09/10/2023 6213 Gross per 24 hour  Intake 396.44 ml  Output --  Net 396.44 ml   Filed Weights   09/09/23 1607  Weight: 45.4 kg    Examination:  General exam: Overall comfortable, not in distress, chronically ill looking, weak, deconditioned HEENT: PERRL Respiratory system: Diminished air sounds on bases bilaterally Cardiovascular system: S1 & S2 heard, RRR.  Gastrointestinal system: Abdomen is nondistended, soft and nontender. Central nervous system: Alert and awake but not oriented Extremities: No edema, no clubbing ,no cyanosis Skin: No rashes, no ulcers,no icterus  ,pressure ulcers    Data Reviewed: I have personally reviewed following labs and imaging studies  CBC: Recent Labs  Lab 09/09/23 1642  WBC 11.3*  NEUTROABS 9.6*  HGB 11.1*  HCT 33.9*  MCV 95.2  PLT 373   Basic Metabolic Panel: Recent Labs  Lab 09/09/23 1642  NA 127*  K 4.3  CL 89*  CO2 22  GLUCOSE 295*  BUN 10  CREATININE 0.89  CALCIUM 9.9     Recent Results (from the past 240 hours)  Resp panel by RT-PCR (RSV, Flu A&B, Covid) Anterior Nasal Swab     Status: None   Collection Time: 09/09/23  4:22 PM   Specimen: Anterior Nasal Swab  Result Value Ref Range Status   SARS Coronavirus 2 by RT PCR NEGATIVE NEGATIVE Final    Comment: (NOTE) SARS-CoV-2 target nucleic acids are NOT DETECTED.  The SARS-CoV-2 RNA is generally detectable in upper respiratory specimens during the acute phase of infection. The lowest concentration of SARS-CoV-2 viral copies this assay can detect is 138 copies/mL. A negative result does not preclude  SARS-Cov-2 infection and should not be used as the sole basis for treatment or other patient management decisions. A negative result may occur with  improper specimen collection/handling, submission of specimen other than nasopharyngeal swab, presence of viral mutation(s) within the areas targeted by this assay, and inadequate number of viral copies(<138 copies/mL). A negative result must be combined with clinical observations, patient history, and epidemiological information. The expected result is Negative.  Fact Sheet for Patients:  BloggerCourse.com  Fact Sheet for Healthcare Providers:  SeriousBroker.it  This test is no t yet approved or cleared by the [place] FDA and  has been authorized for detection and/or diagnosis of SARS-CoV-2 by FDA under an Emergency Use Authorization (EUA). This EUA will remain  in effect (meaning this test can be used) for the duration of the COVID-19 declaration under Section 564(b)(1) of the Act, 21 U.S.C.section 360bbb-3(b)(1), unless the authorization is terminated  or  revoked sooner.       Influenza A by PCR NEGATIVE NEGATIVE Final   Influenza B by PCR NEGATIVE NEGATIVE Final    Comment: (NOTE) The Xpert Xpress SARS-CoV-2/FLU/RSV plus assay is intended as an aid in the diagnosis of influenza from Nasopharyngeal swab specimens and should not be used as a sole basis for treatment. Nasal washings and aspirates are unacceptable for Xpert Xpress SARS-CoV-2/FLU/RSV testing.  Fact Sheet for Patients: BloggerCourse.com  Fact Sheet for Healthcare Providers: SeriousBroker.it  This test is not yet approved or cleared by the [place] FDA and has been authorized for detection and/or diagnosis of SARS-CoV-2 by FDA under an Emergency Use Authorization (EUA). This EUA will remain in effect (meaning this test can be used) for the duration of  the COVID-19 declaration under Section 564(b)(1) of the Act, 21 U.S.C. section 360bbb-3(b)(1), unless the authorization is terminated or revoked.     Resp Syncytial Virus by PCR NEGATIVE NEGATIVE Final    Comment: (NOTE) Fact Sheet for Patients: BloggerCourse.com  Fact Sheet for Healthcare Providers: SeriousBroker.it  This test is not yet approved or cleared by the [place] FDA and has been authorized for detection and/or diagnosis of SARS-CoV-2 by FDA under an Emergency Use Authorization (EUA). This EUA will remain in effect (meaning this test can be used) for the duration of the COVID-19 declaration under Section 564(b)(1) of the Act, 21 U.S.C. section 360bbb-3(b)(1), unless the authorization is terminated or revoked.  Performed at [facility], [address], [place], [place] [address]      Radiology Studies: CT CHEST ABDOMEN PELVIS W CONTRAST Result Date: 09/09/2023 CLINICAL DATA:  Altered mental status, sepsis EXAM: CT CHEST, ABDOMEN, AND PELVIS WITH CONTRAST TECHNIQUE: Multidetector CT imaging of the chest, abdomen and pelvis was performed following the standard protocol during bolus administration of intravenous contrast. RADIATION DOSE REDUCTION: This exam was performed according to the departmental dose-optimization program which includes automated exposure control, adjustment of the mA and/or kV according to patient size and/or use of iterative reconstruction technique. CONTRAST:  80mL OMNIPAQUE IOHEXOL 300 MG/ML  SOLN COMPARISON:  CTA chest CT abdomen/pelvis dated 05/18/2023 FINDINGS: CT CHEST FINDINGS Cardiovascular: The heart is normal in size. No pericardial effusion. No evidence of thoracic aortic aneurysm. Atherosclerotic calcifications of the arch. Moderate brain sclerosis of the LAD and right coronary artery. Mediastinum/Nodes: No suspicious mediastinal lymphadenopathy. Lungs/Pleura: Biapical  pleural-parenchymal scarring. Faint ground-glass/patchy opacities in the posterior right upper lobe and right greater than left lower lobes, favoring multifocal pneumonia. Associated debris in the right lower lobe bronchus (series 5/image 37), suggesting aspiration. No pleural effusion or pneumothorax. Musculoskeletal: Visualized osseous structures are within normal limits. CT ABDOMEN PELVIS FINDINGS Hepatobiliary: Liver is within normal limits, noting focal fat/altered perfusion along the falciform ligament. Gallbladder is unremarkable. No intrahepatic or extrahepatic ductal dilatation. Pancreas: Within normal limits. Spleen: Within normal limits. Adrenals/Urinary Tract: Adrenal glands are within normal limits. 2.7 cm simple left lower pole renal cyst (series 5/image 31), benign (Bosniak I). No follow-up is recommended. Right kidney is within normal limits. No hydronephrosis. Bladder is underdistended but unremarkable. Stomach/Bowel: Stomach is within normal limits. No evidence of bowel obstruction. Appendix is not discretely visualized. No colonic wall thickening or inflammatory changes. Mildly increased rectal stool burden. Vascular/Lymphatic: No evidence of abdominal aortic aneurysm. Atherosclerotic calcifications of the abdominal aorta and branch vessels, although vessels remain patent. No suspicious abdominopelvic lymphadenopathy. Reproductive: Status post hysterectomy. No adnexal masses. Other: Small volume pelvic ascites. Musculoskeletal: Mild degenerative changes  of the lumbar spine. IMPRESSION: Multifocal pneumonia, right greater than left, with suspected aspiration. Additional stable ancillary findings as above. Electronically Signed   By: Charline Bills M.D.   On: 09/09/2023 20:37   CT Head Wo Contrast Result Date: 09/09/2023 CLINICAL DATA:  Memory loss, altered mental status EXAM: CT HEAD WITHOUT CONTRAST TECHNIQUE: Contiguous axial images were obtained from the base of the skull through the  vertex without intravenous contrast. RADIATION DOSE REDUCTION: This exam was performed according to the departmental dose-optimization program which includes automated exposure control, adjustment of the mA and/or kV according to patient size and/or use of iterative reconstruction technique. COMPARISON:  06/15/2023 FINDINGS: Brain: No evidence of acute infarction, hemorrhage, extra-axial collection or mass lesion/mass effect. Global cortical and central atrophy. Secondary ventriculomegaly, stable. Subcortical white matter and periventricular small vessel ischemic changes. Vascular: Intracranial atherosclerosis. Skull: Normal. Negative for fracture or focal lesion. Sinuses/Orbits: The visualized paranasal sinuses are essentially clear. The mastoid air cells are unopacified. Other: None. IMPRESSION: No acute intracranial abnormality. Atrophy with small vessel ischemic changes. Electronically Signed   By: Charline Bills M.D.   On: 09/09/2023 20:28   DG Chest Port 1 View Result Date: 09/09/2023 CLINICAL DATA:  Questionable sepsis. EXAM: PORTABLE CHEST 1 VIEW COMPARISON:  Chest radiograph dated 06/14/2023. FINDINGS: No focal consolidation, pleural effusion, pneumothorax. Bilateral nipple shadows noted. The cardiac silhouette is within normal limits. Atherosclerotic calcification of the aorta. A loop recorder device. No acute osseous pathology. IMPRESSION: No active disease. Electronically Signed   By: Elgie Collard M.D.   On: 09/09/2023 16:42    Scheduled Meds:  aspirin EC  81 mg Oral Daily   clopidogrel  75 mg Oral Q breakfast   famotidine  10 mg Oral QHS   folic acid  1 mg Oral QHS   insulin aspart  0-5 Units Subcutaneous QHS   insulin aspart  0-9 Units Subcutaneous TID WC   levothyroxine  100 mcg Oral Once per day on Sunday Tuesday Wednesday Thursday Friday Saturday   [START ON 09/13/2023] levothyroxine  150 mcg Oral Every Monday   [START ON 09/14/2023] methotrexate  12.5 mg Oral Q Tue   predniSONE   5 mg Oral Q breakfast   Continuous Infusions:  ceFEPime (MAXIPIME) IV Stopped (09/10/23 0430)   lactated ringers 150 mL/hr at 09/10/23 0607   levETIRAcetam Stopped (09/10/23 1610)   metronidazole Stopped (09/10/23 0539)     LOS: 1 day   Burnadette Pop, MD [facility] P2/21/2025, 8:18 AM

## 2023-09-10 NOTE — Evaluation (Signed)
Clinical/Bedside Swallow Evaluation Patient Details  Name: [name] [name] MRN: [id number] Date of Birth: Dec 24, 1933  Today's Date: 09/10/2023 Time: SLP Start Time (ACUTE ONLY): 1005 SLP Stop Time (ACUTE ONLY): 1030 SLP Time Calculation (min) (ACUTE ONLY): 25 min  Past Medical History:  Past Medical History:  Diagnosis Date   Arthritis    "knees, legs" (06/10/2016)   Ascites    Chronic kidney disease    "related to my diabetes"   Chronic lower back pain    Dementia without behavioral disturbance (HCC)    Diabetes mellitus without complication (HCC)    Eczema    GERD (gastroesophageal reflux disease)    High cholesterol    Hyperlipidemia    Hypertension    Hypothyroidism    OAB (overactive bladder)    Osteoporosis    Thyroid disease    Type II diabetes mellitus (HCC)    Urticaria    Past Surgical History:  Past Surgical History:  Procedure Laterality Date   ABDOMINAL AORTOGRAM W/LOWER EXTREMITY N/A 02/08/2023   Procedure: ABDOMINAL AORTOGRAM W/LOWER EXTREMITY;  Surgeon: Chuck Hint, MD;  Location: [facility] INVASIVE CV LAB;  Service: Vascular;  Laterality: N/A;   ABDOMINAL HYSTERECTOMY     KNEE ARTHROSCOPY     LAPAROTOMY N/A 12/03/2016   Procedure: EXPLORATORY LAPAROTOMY, LYSIS OF ADHESIONS;  Surgeon: Darnell Level, MD;  Location: [facility] ORS;  Service: General;  Laterality: N/A;   LOOP RECORDER INSERTION N/A 08/23/2019   Procedure: LOOP RECORDER INSERTION;  Surgeon: Marinus Maw, MD;  Location: [facility] INVASIVE CV LAB;  Service: Cardiovascular;  Laterality: N/A;   PERIPHERAL VASCULAR INTERVENTION  02/08/2023   Procedure: PERIPHERAL VASCULAR INTERVENTION;  Surgeon: Chuck Hint, MD;  Location: [facility] INVASIVE CV LAB;  Service: Vascular;;   vocal cord polyps     HPI:  89yo female admitted from home 09/09/23 with cough, decreased appetite, AMS. On home hospice. PMH: dementia, CVAx2, HTN, HLD, CKD, dermatomyositis, DM, PAD, hyopothyroid, seizure. CTChest - bilateral PNA.     Assessment / Plan / Recommendation  Clinical Impression  Limited BSE for safe feeding recommendations completed. Pt is edentulous. Unable to complete CN exam, as pt is unable to follow directions. Oral care completed with suction.   Pt accepted individual ice chips and boluses of applesauce. Ice chips fell from oral cavity, and pt exhibited oral holding of all boluses of puree with no oral manipulation. No swallow reflex elicited following PO trials. Oral cavity suctioned to remove contents. Pt appropriateness/safety for PO intake will be highly variable.   Recommend puree/thin, crushed meds ONLY when pt is awake, alert, upright and exhibiting oral awareness/manipulation and swallow reflex. Recommend oral care with suction before and after PO intake. No family present at this time to educate. Results and recommendations relayed to MD/medic. Written educational information left with medic Lauren. SLP will follow up with family present to provide education regarding PO intake with known high aspiration risk.  SLP Visit Diagnosis: Dysphagia, unspecified (R13.10)    Aspiration Risk  Risk for inadequate nutrition/hydration;Severe aspiration risk    Diet Recommendation Dysphagia 1 (Puree);Thin liquid (when awake, alert, and accepting/swallowing PO)    Liquid Administration via: Spoon;Cup;Straw;Other (Comment) (as tolerated) Medication Administration: Crushed with puree Supervision: Staff to assist with self feeding;Full supervision/cueing for compensatory strategies Compensations: Minimize environmental distractions;Slow rate;Small sips/bites;Other (Comment) (check oral cavity for clearing) Postural Changes: Seated upright at 90 degrees;Remain upright for at least 30 minutes after po intake    Other  Recommendations Oral Care Recommendations: Oral  care before and after PO;Staff/trained caregiver to provide oral care Caregiver Recommendations: Have oral suction available    Recommendations for  follow up therapy are one component of a multi-disciplinary discharge planning process, led by the attending physician.  Recommendations may be updated based on patient status, additional functional criteria and insurance authorization.  Follow up Recommendations Follow physician's recommendations for discharge plan and follow up therapies      Assistance Recommended at Discharge  Full supervision/assist  Functional Status Assessment Patient has not had a recent decline in their functional status  Frequency and Duration min 1 x/week  1 week       Prognosis Prognosis for improved oropharyngeal function: Guarded Barriers to Reach Goals: Cognitive deficits;Severity of deficits      Swallow Study   General Date of Onset: 09/09/23 HPI: 89yo female admitted from home 09/09/23 with cough, decreased appetite, AMS. On home hospice. PMH: dementia, CVAx2, HTN, HLD, CKD, dermatomyositis, DM, PAD, hyopothyroid, seizure. CTChest - bilateral PNA. Type of Study: Bedside Swallow Evaluation Previous Swallow Assessment: BSE 06/15/23 = puree/thin, crush meds Diet Prior to this Study: Full liquid diet;Extremely thick liquids (Level 4, pudding thick) Temperature Spikes Noted: No Respiratory Status: Nasal cannula History of Recent Intubation: No Behavior/Cognition: Alert;Doesn't follow directions;Requires cueing Oral Cavity Assessment: Within Functional Limits Oral Care Completed by SLP: Yes (with suction) Oral Cavity - Dentition: Edentulous Self-Feeding Abilities: Total assist Patient Positioning: Upright in bed Baseline Vocal Quality: Not observed Volitional Cough: Cognitively unable to elicit    Oral/Motor/Sensory Function Overall Oral Motor/Sensory Function: Other (comment) (unable to assess)   Ice Chips Ice chips: Impaired Presentation: Spoon Oral Phase Impairments: Poor awareness of bolus;Reduced labial seal;Reduced lingual movement/coordination;Impaired mastication Oral Phase Functional  Implications: Oral holding Other Comments: ice chips fell from pt mouth. No attempt to manipulate.   Thin Liquid Thin Liquid: Impaired Presentation: Straw;Cup;Spoon Oral Phase Impairments: Reduced labial seal Other Comments: unable to drink from straw at this time, oral leakage via cup and teaspoon. No swallow elicited. Oral cavity suctioned    Nectar Thick Nectar Thick Liquid: Not tested   Honey Thick Honey Thick Liquid: Not tested   Puree Puree: Impaired Presentation: Spoon Oral Phase Impairments: Reduced labial seal;Reduced lingual movement/coordination;Poor awareness of bolus Oral Phase Functional Implications: Right anterior spillage;Oral holding Other Comments: no oral manipulation of puree. Oral contents suctioned with no swallow reflex noted.   Solid     Solid: Not tested     Ferrah Panagopoulos B. Murvin Natal, [facility], CCC-SLP Speech Language Pathologist  [facility] 09/10/2023,11:29 AM

## 2023-09-10 NOTE — Plan of Care (Signed)
   Problem: Education: Goal: Ability to describe self-care measures that may prevent or decrease complications (Diabetes Survival Skills Education) will improve Outcome: Progressing Goal: Individualized Educational Video(s) Outcome: Progressing   Problem: Coping: Goal: Ability to adjust to condition or change in health will improve Outcome: Progressing   Problem: Fluid Volume: Goal: Ability to maintain a balanced intake and output will improve Outcome: Progressing   Problem: Health Behavior/Discharge Planning: Goal: Ability to identify and utilize available resources and services will improve Outcome: Progressing Goal: Ability to manage health-related needs will improve Outcome: Progressing   Problem: Metabolic: Goal: Ability to maintain appropriate glucose levels will improve Outcome: Progressing   Problem: Nutritional: Goal: Maintenance of adequate nutrition will improve Outcome: Progressing Goal: Progress toward achieving an optimal weight will improve Outcome: Progressing   Problem: Skin Integrity: Goal: Risk for impaired skin integrity will decrease Outcome: Progressing   Problem: Tissue Perfusion: Goal: Adequacy of tissue perfusion will improve Outcome: Progressing   Problem: Activity: Goal: Ability to tolerate increased activity will improve Outcome: Progressing   Problem: Clinical Measurements: Goal: Ability to maintain a body temperature in the normal range will improve Outcome: Progressing   Problem: Respiratory: Goal: Ability to maintain adequate ventilation will improve Outcome: Progressing Goal: Ability to maintain a clear airway will improve Outcome: Progressing   Problem: Education: Goal: Knowledge of General Education information will improve Description: Including pain rating scale, medication(s)/side effects and non-pharmacologic comfort measures Outcome: Progressing   Problem: Health Behavior/Discharge Planning: Goal: Ability to manage  health-related needs will improve Outcome: Progressing   Problem: Clinical Measurements: Goal: Ability to maintain clinical measurements within normal limits will improve Outcome: Progressing Goal: Will remain free from infection Outcome: Progressing Goal: Diagnostic test results will improve Outcome: Progressing Goal: Respiratory complications will improve Outcome: Progressing Goal: Cardiovascular complication will be avoided Outcome: Progressing   Problem: Activity: Goal: Risk for activity intolerance will decrease Outcome: Progressing   Problem: Nutrition: Goal: Adequate nutrition will be maintained Outcome: Progressing   Problem: Coping: Goal: Level of anxiety will decrease Outcome: Progressing   Problem: Elimination: Goal: Will not experience complications related to bowel motility Outcome: Progressing Goal: Will not experience complications related to urinary retention Outcome: Progressing   Problem: Pain Managment: Goal: General experience of comfort will improve and/or be controlled Outcome: Progressing   Problem: Safety: Goal: Ability to remain free from injury will improve Outcome: Progressing   Problem: Skin Integrity: Goal: Risk for impaired skin integrity will decrease Outcome: Progressing

## 2023-09-11 ENCOUNTER — Other Ambulatory Visit (HOSPITAL_COMMUNITY): Payer: Self-pay

## 2023-09-11 DIAGNOSIS — J690 Pneumonitis due to inhalation of food and vomit: Secondary | ICD-10-CM | POA: Diagnosis not present

## 2023-09-11 LAB — CBC
HCT: 25.2 % — ABNORMAL LOW (ref 36.0–46.0)
Hemoglobin: 8.3 g/dL — ABNORMAL LOW (ref 12.0–15.0)
MCH: 31.7 pg (ref 26.0–34.0)
MCHC: 32.9 g/dL (ref 30.0–36.0)
MCV: 96.2 fL (ref 80.0–100.0)
Platelets: 280 10*3/uL (ref 150–400)
RBC: 2.62 MIL/uL — ABNORMAL LOW (ref 3.87–5.11)
RDW: 14.9 % (ref 11.5–15.5)
WBC: 11.7 10*3/uL — ABNORMAL HIGH (ref 4.0–10.5)
nRBC: 0 % (ref 0.0–0.2)

## 2023-09-11 LAB — BASIC METABOLIC PANEL
Anion gap: 8 (ref 5–15)
BUN: 15 mg/dL (ref 8–23)
CO2: 25 mmol/L (ref 22–32)
Calcium: 8.7 mg/dL — ABNORMAL LOW (ref 8.9–10.3)
Chloride: 97 mmol/L — ABNORMAL LOW (ref 98–111)
Creatinine, Ser: 0.62 mg/dL (ref 0.44–1.00)
GFR, Estimated: >60 mL/min (ref >60)
Glucose, Bld: 138 mg/dL — ABNORMAL HIGH (ref 70–99)
Potassium: 3.1 mmol/L — ABNORMAL LOW (ref 3.5–5.1)
Sodium: 130 mmol/L — ABNORMAL LOW (ref 135–145)

## 2023-09-11 LAB — LACTIC ACID, PLASMA: Lactic Acid, Venous: 1.1 mmol/L (ref 0.5–1.9)

## 2023-09-11 LAB — GLUCOSE, CAPILLARY: Glucose-Capillary: 124 mg/dL — ABNORMAL HIGH (ref 70–99)

## 2023-09-11 MED ORDER — POTASSIUM CHLORIDE ER 20 MEQ PO TBCR
40.0000 meq | EXTENDED_RELEASE_TABLET | Freq: Every day | ORAL | 0 refills | Status: AC
  Filled 2023-09-11: qty 6, 3d supply, fill #0

## 2023-09-11 MED ORDER — AMOXICILLIN-POT CLAVULANATE NICU ORAL SYRINGE 400-57 MG/5 ML: 10.0000 mg/kg | Freq: Three times a day (TID) | ORAL | Status: DC

## 2023-09-11 MED ORDER — POTASSIUM CHLORIDE 20 MEQ PO PACK
40.0000 meq | PACK | Freq: Once | ORAL | Status: AC
  Administered 2023-09-11: 40 meq via ORAL
  Filled 2023-09-11: qty 2

## 2023-09-11 MED ORDER — AMOXICILLIN-POT CLAVULANATE 400-57 MG/5ML PO SUSR
400.0000 mg | Freq: Two times a day (BID) | ORAL | Status: DC
  Filled 2023-09-11: qty 5

## 2023-09-11 MED ORDER — AMOXICILLIN-POT CLAVULANATE 400-57 MG/5ML PO SUSR
400.0000 mg | Freq: Two times a day (BID) | ORAL | 0 refills | Status: AC
Start: 2023-09-11 — End: 2023-09-21
  Filled 2023-09-11: qty 100, 10d supply, fill #0

## 2023-09-11 NOTE — Plan of Care (Signed)
 Patient's daughter verbalized understanding of discharge instructions.  Problem: Education: Goal: Ability to describe self-care measures that may prevent or decrease complications (Diabetes Survival Skills Education) will improve Outcome: Adequate for Discharge Goal: Individualized Educational Video(s) Outcome: Adequate for Discharge   Problem: Coping: Goal: Ability to adjust to condition or change in health will improve Outcome: Adequate for Discharge   Problem: Fluid Volume: Goal: Ability to maintain a balanced intake and output will improve Outcome: Adequate for Discharge   Problem: Health Behavior/Discharge Planning: Goal: Ability to identify and utilize available resources and services will improve Outcome: Adequate for Discharge Goal: Ability to manage health-related needs will improve Outcome: Adequate for Discharge   Problem: Metabolic: Goal: Ability to maintain appropriate glucose levels will improve Outcome: Adequate for Discharge   Problem: Nutritional: Goal: Maintenance of adequate nutrition will improve Outcome: Adequate for Discharge Goal: Progress toward achieving an optimal weight will improve Outcome: Adequate for Discharge   Problem: Skin Integrity: Goal: Risk for impaired skin integrity will decrease Outcome: Adequate for Discharge   Problem: Tissue Perfusion: Goal: Adequacy of tissue perfusion will improve Outcome: Adequate for Discharge   Problem: Activity: Goal: Ability to tolerate increased activity will improve Outcome: Adequate for Discharge   Problem: Clinical Measurements: Goal: Ability to maintain a body temperature in the normal range will improve Outcome: Adequate for Discharge   Problem: Respiratory: Goal: Ability to maintain adequate ventilation will improve Outcome: Adequate for Discharge Goal: Ability to maintain a clear airway will improve Outcome: Adequate for Discharge   Problem: Education: Goal: Knowledge of General Education  information will improve Description: Including pain rating scale, medication(s)/side effects and non-pharmacologic comfort measures Outcome: Adequate for Discharge   Problem: Health Behavior/Discharge Planning: Goal: Ability to manage health-related needs will improve Outcome: Adequate for Discharge   Problem: Clinical Measurements: Goal: Ability to maintain clinical measurements within normal limits will improve Outcome: Adequate for Discharge Goal: Will remain free from infection Outcome: Adequate for Discharge Goal: Diagnostic test results will improve Outcome: Adequate for Discharge Goal: Respiratory complications will improve Outcome: Adequate for Discharge Goal: Cardiovascular complication will be avoided Outcome: Adequate for Discharge   Problem: Activity: Goal: Risk for activity intolerance will decrease Outcome: Adequate for Discharge   Problem: Nutrition: Goal: Adequate nutrition will be maintained Outcome: Adequate for Discharge   Problem: Coping: Goal: Level of anxiety will decrease Outcome: Adequate for Discharge   Problem: Elimination: Goal: Will not experience complications related to bowel motility Outcome: Adequate for Discharge Goal: Will not experience complications related to urinary retention Outcome: Adequate for Discharge   Problem: Pain Managment: Goal: General experience of comfort will improve and/or be controlled Outcome: Adequate for Discharge   Problem: Safety: Goal: Ability to remain free from injury will improve Outcome: Adequate for Discharge   Problem: Skin Integrity: Goal: Risk for impaired skin integrity will decrease Outcome: Adequate for Discharge

## 2023-09-11 NOTE — TOC Transition Note (Signed)
 Transition of Care [facility]) - Discharge Note   Patient Details  Name: [name] [name] MRN: [id number] Date of Birth: [birth year]  [facility] of [facility] CM/SW Contact:  [name], LCSW Phone Number: 09/11/2023, 11:39 AM   Clinical Narrative:    Pt medically ready to d/c home. CSW spoke w/ pt dtr to confirm address and that family will be present to retrieve pt at home. DNR signed and added to d/c packet along w/ facesheet and med necessity form. D/c packet left at nurses station. PTAR called at 10:17am. No further TOC needs.    Final next level of care: Home w Hospice Care Barriers to Discharge: Barriers Resolved   Patient Goals and CMS Choice Patient states their goals for this hospitalization and ongoing recovery are:: return home CMS Medicare.gov Compare Post Acute Care list provided to:: Patient Represenative (must comment) ([name] (dtr)) Choice offered to / list presented to : NA      Discharge Placement                Patient to be transferred to facility by: PTAR Name of family member notified: [name] (Daughter)  [phone] (Home Phone) Patient and family notified of of transfer: 09/11/23  Discharge Plan and Services Additional resources added to the After Visit Summary for     Discharge Planning Services: CM Consult Post Acute Care Choice: Resumption of Svcs/PTA Provider          DME Arranged: N/A DME Agency: NA       HH Arranged: NA HH Agency: NA        Social Drivers of Health (SDOH) Interventions SDOH Screenings   Food Insecurity: No Food Insecurity (09/10/2023)  Housing: Low Risk  (09/10/2023)  Transportation Needs: No Transportation Needs (09/10/2023)  Utilities: Not At Risk (09/10/2023)  Social Connections: Patient Unable To Answer (09/10/2023)  Tobacco Use: Medium Risk (09/09/2023)     Readmission Risk Interventions    09/10/2023    3:52 PM 06/21/2023   11:59 AM 05/24/2023   12:19 PM  Readmission Risk Prevention Plan   Transportation Screening Complete Complete Complete  Medication Review Oceanographer) Complete  Complete  PCP or Specialist appointment within 3-5 days of discharge Complete  Complete  HRI or Home Care Consult Complete Complete Complete  SW Recovery Care/Counseling Consult Complete Complete Complete  Palliative Care Screening Complete Complete Complete  Skilled Nursing Facility Not Applicable Not Applicable Not Applicable

## 2023-09-11 NOTE — Discharge Summary (Signed)
 Physician Discharge Summary  [name] XBM:[id number] DOB: 1934/06/27 DOA: 09/09/2023  PCP: [name], MD  Admit date: 09/09/2023 Discharge date: 09/11/2023  Admitted From: Home Disposition:  Home  Discharge Condition:Stable CODE STATUS:DNR Diet recommendation:Dysphagia 1  Brief/Interim Summary: Patient is 89 year old female with history of dementia, bedbound status, strokes, currently on home hospice, type 2 diabetes, PAD, hypothyroidism presented from home with complaint of cough, increased lethargy, low appetite.  Though the patient is enrolled on hospice, family receptive for antibiotics and IV fluid.  On presentation, CT head did not show any acute findings but chest CT showed multifocal pneumonia, right greater than left, with suspected aspiration, likely from aspiration.  Started antibiotics.  Palliative care also consulted for goals of care.  Speech therapy recommended dysphagia 1 diet .  She is currently on room air, not in any kind of respiratory distress.  Leukocytosis, tach acidosis resolved .antibiotics changed to oral.  Family ready to take her home with hospice.  Medically stable for discharge.  Following problems were addressed during the hospitalization:  Acute hypoxic respiratory failure secondary to aspiration pneumonia: Aspiration pneumonia is the most likely recurrent problem.  Speech therapy consulted.  Started on dysphagia 1 .  Respiratory status remains significantly improved today.  On room air.  Not in any current respiratory distress. Abx changed to oral   Sepsis: Presented with mild leukocytosis, elevated lactate.  Responded to antibiotics, and fluids.  Cultures have been negative so far  Hyponatremia: This is most likely contributed by hyperglycemia as well.  Stable   Diabetes type 2: Continue home regimen   History of dermatomyositis: On prednisone, weekly methotrexate   Hypothyroidism: On Synthyroid   History of seizure disorder: On  Keppra at  home   Normocytic anemia: Hemoglobin in the range of 8 today  Multiple pressure ulcers: Wound care consulted.  Continue wound care at home.   Goals of care: Elderly patient with recurrent aspiration pneumonia.  Currently enrolled with home hospice.  CODE STATUS DNR.   Discharge Diagnoses:  Principal Problem:   Aspiration pneumonia (HCC) Active Problems:   Oral thrush   Oropharyngeal dysphagia   Uncontrolled type 2 diabetes mellitus with hypoglycemia, with long-term current use of insulin (HCC)   Hypothyroidism   Dermatomyositis (HCC)   Dementia without behavioral disturbance (HCC)   Chronic ulcer of right great toe [facility])    Discharge Instructions  Discharge Instructions     Diet general   Complete by: As directed    Dysphagia 1 diet   Discharge instructions   Complete by: As directed    1)Please take prescribed medications as instructed 2)Follow up with hospice services   Discharge wound care:   Complete by: As directed    As per wound care nurse   Increase activity slowly   Complete by: As directed       Allergies as of 09/11/2023       Reactions   Nsaids Other (See Comments)   No NSAIDS due to kidney function- ESPECIALLY ibuprofen or Aleve   Biaxin [clarithromycin] Hives   Statins Other (See Comments)   Muscle pain (myositis per daugher)   Bactrim [sulfamethoxazole-trimethoprim] Hives, Rash   Lisinopril Cough   Sulfa Antibiotics Hives, Rash, Other (See Comments)   NO sulfa-based meds!!        Medication List     STOP taking these medications    cetirizine 10 MG tablet Commonly known as: ZYRTEC   metoprolol tartrate 25 MG tablet Commonly known as:  LOPRESSOR       TAKE these medications    amoxicillin-clavulanate 400-57 MG/5ML suspension Commonly known as: AUGMENTIN Take 5 mLs (400 mg total) by mouth every 12 (twelve) hours for 5 days.   Basaglar KwikPen 100 UNIT/ML Inject 3-7 Units into the skin See admin instructions. Inject 7 units into  the skin in the morning and 3 units at bedtime as needed for an elevated BGL- WHEN EATING   Bayer Low Dose 81 MG tablet Generic drug: aspirin EC Take 81 mg by mouth in the morning. Swallow whole.   clopidogrel 75 MG tablet Commonly known as: PLAVIX Take 1 tablet (75 mg total) by mouth daily with breakfast.   ezetimibe 10 MG tablet Commonly known as: Zetia Take 1 tablet (10 mg total) by mouth at bedtime.   folic acid 1 MG tablet Commonly known as: FOLVITE Take 1 mg by mouth at bedtime.   hyoscyamine 0.125 MG SL tablet Commonly known as: LEVSIN SL Place 0.125 mg under the tongue every 4 (four) hours as needed (to manage excessive secretions by reducing mucus production in the respiratory tract).   levETIRAcetam 500 MG tablet Commonly known as: KEPPRA Take 500 mg by mouth in the morning and at bedtime.   methotrexate 2.5 MG tablet Commonly known as: RHEUMATREX Take 1 tablet (2.5 mg total) by mouth once a week. Caution:Chemotherapy. Protect from light. What changed:  how much to take when to take this   NovoLOG FlexPen 100 UNIT/ML FlexPen Generic drug: insulin aspart Inject 2-10 Units into the skin See admin instructions. Inject 2-10 units into the skin three times a day with meals, per sliding scale:  Breakfast: BGL 80-199 = 8 units; 200-299 = 9 units; 300 or greater = 10 units Lunch: BGL 80-199 = 5 units; 200-299 = 6 units; 300 or greater = 7 units Supper/evening meal: BGL 80-199 = 2 units; 200-299 = 3 units; 300 or greater = 4 units   nystatin 100000 UNIT/ML suspension Commonly known as: MYCOSTATIN Take 5 mLs (500,000 Units total) by mouth 4 (four) times daily.   Pepcid Complete 10-800-165 MG chewable tablet Generic drug: famotidine-calcium carbonate-magnesium hydroxide Chew 1 tablet by mouth at bedtime.   Plaquenil 200 MG tablet Generic drug: hydroxychloroquine Take 200 mg by mouth at bedtime.   potassium chloride 20 MEQ packet Commonly known as: Klor-Con Take 40  mEq by mouth daily for 3 days. Start taking on: September 12, 2023   predniSONE 5 MG tablet Commonly known as: DELTASONE Take 5 mg by mouth daily with breakfast.   Synthroid 100 MCG tablet Generic drug: levothyroxine Take 100-150 mcg by mouth daily before breakfast. Take 100 mcg by mouth 30 minutes before breakfast on Sun/Tues/Wed/Thurs/Fri/Sat and 150 mcg on Mon               Discharge Care Instructions  (From admission, onward)           Start     Ordered   09/11/23 0000  Discharge wound care:       Comments: As per wound care nurse   09/11/23 0955            Follow-up Information     [facility] Follow up.   Specialty: PALLIATIVE CARE Contact information: [address] [facility] [address] [phone]               Allergies  Allergen Reactions   Nsaids Other (See Comments)    No NSAIDS due to  kidney function- ESPECIALLY ibuprofen or Aleve   Biaxin [Clarithromycin] Hives   Statins Other (See Comments)    Muscle pain (myositis per daugher)   Bactrim [Sulfamethoxazole-Trimethoprim] Hives and Rash   Lisinopril Cough   Sulfa Antibiotics Hives, Rash and Other (See Comments)    NO sulfa-based meds!!     Consultations: None   Procedures/Studies: CT CHEST ABDOMEN PELVIS W CONTRAST Result Date: 09/09/2023 CLINICAL DATA:  Altered mental status, sepsis EXAM: CT CHEST, ABDOMEN, AND PELVIS WITH CONTRAST TECHNIQUE: Multidetector CT imaging of the chest, abdomen and pelvis was performed following the standard protocol during bolus administration of intravenous contrast. RADIATION DOSE REDUCTION: This exam was performed according to the departmental dose-optimization program which includes automated exposure control, adjustment of the mA and/or kV according to patient size and/or use of iterative reconstruction technique. CONTRAST:  80mL OMNIPAQUE IOHEXOL 300 MG/ML  SOLN COMPARISON:  CTA chest CT abdomen/pelvis dated  05/18/2023 FINDINGS: CT CHEST FINDINGS Cardiovascular: The heart is normal in size. No pericardial effusion. No evidence of thoracic aortic aneurysm. Atherosclerotic calcifications of the arch. Moderate brain sclerosis of the LAD and right coronary artery. Mediastinum/Nodes: No suspicious mediastinal lymphadenopathy. Lungs/Pleura: Biapical pleural-parenchymal scarring. Faint ground-glass/patchy opacities in the posterior right upper lobe and right greater than left lower lobes, favoring multifocal pneumonia. Associated debris in the right lower lobe bronchus (series 5/image 37), suggesting aspiration. No pleural effusion or pneumothorax. Musculoskeletal: Visualized osseous structures are within normal limits. CT ABDOMEN PELVIS FINDINGS Hepatobiliary: Liver is within normal limits, noting focal fat/altered perfusion along the falciform ligament. Gallbladder is unremarkable. No intrahepatic or extrahepatic ductal dilatation. Pancreas: Within normal limits. Spleen: Within normal limits. Adrenals/Urinary Tract: Adrenal glands are within normal limits. 2.7 cm simple left lower pole renal cyst (series 5/image 31), benign (Bosniak I). No follow-up is recommended. Right kidney is within normal limits. No hydronephrosis. Bladder is underdistended but unremarkable. Stomach/Bowel: Stomach is within normal limits. No evidence of bowel obstruction. Appendix is not discretely visualized. No colonic wall thickening or inflammatory changes. Mildly increased rectal stool burden. Vascular/Lymphatic: No evidence of abdominal aortic aneurysm. Atherosclerotic calcifications of the abdominal aorta and branch vessels, although vessels remain patent. No suspicious abdominopelvic lymphadenopathy. Reproductive: Status post hysterectomy. No adnexal masses. Other: Small volume pelvic ascites. Musculoskeletal: Mild degenerative changes of the lumbar spine. IMPRESSION: Multifocal pneumonia, right greater than left, with suspected aspiration.  Additional stable ancillary findings as above. Electronically Signed   By: Charline Bills M.D.   On: 09/09/2023 20:37   CT Head Wo Contrast Result Date: 09/09/2023 CLINICAL DATA:  Memory loss, altered mental status EXAM: CT HEAD WITHOUT CONTRAST TECHNIQUE: Contiguous axial images were obtained from the base of the skull through the vertex without intravenous contrast. RADIATION DOSE REDUCTION: This exam was performed according to the departmental dose-optimization program which includes automated exposure control, adjustment of the mA and/or kV according to patient size and/or use of iterative reconstruction technique. COMPARISON:  06/15/2023 FINDINGS: Brain: No evidence of acute infarction, hemorrhage, extra-axial collection or mass lesion/mass effect. Global cortical and central atrophy. Secondary ventriculomegaly, stable. Subcortical white matter and periventricular small vessel ischemic changes. Vascular: Intracranial atherosclerosis. Skull: Normal. Negative for fracture or focal lesion. Sinuses/Orbits: The visualized paranasal sinuses are essentially clear. The mastoid air cells are unopacified. Other: None. IMPRESSION: No acute intracranial abnormality. Atrophy with small vessel ischemic changes. Electronically Signed   By: Charline Bills M.D.   On: 09/09/2023 20:28   DG Chest Port 1 View Result Date: 09/09/2023 CLINICAL DATA:  Questionable sepsis.  EXAM: PORTABLE CHEST 1 VIEW COMPARISON:  Chest radiograph dated 06/14/2023. FINDINGS: No focal consolidation, pleural effusion, pneumothorax. Bilateral nipple shadows noted. The cardiac silhouette is within normal limits. Atherosclerotic calcification of the aorta. A loop recorder device. No acute osseous pathology. IMPRESSION: No active disease. Electronically Signed   By: Elgie Collard M.D.   On: 09/09/2023 16:42      Subjective: Patient seen and examined at bedside today.  Hemodynamically stable.  Comfortable.  Lying on bed.  On room air.   Not coughing or in distress.   Discharge plan discussed with daughter at bedside.  Discharge Exam: Vitals:   09/10/23 1709 09/11/23 0443  BP: 137/79 126/68  Pulse: 91 66  Resp: 16 14  Temp: (!) 97.4 F (36.3 C) 97.6 F (36.4 C)  SpO2: 99% 100%   Vitals:   09/10/23 1053 09/10/23 1305 09/10/23 1709 09/11/23 0443  BP:  111/66 137/79 126/68  Pulse:  84 91 66  Resp:  16 16 14   Temp: 98.7 F (37.1 C) (!) 97.4 F (36.3 C) (!) 97.4 F (36.3 C) 97.6 F (36.4 C)  TempSrc: Oral Oral Oral Oral  SpO2:  98% 99% 100%  Weight:      Height:        General: Pt is alert, awake, not in acute distress, chronically ill looking, deconditioned Cardiovascular: RRR, S1/S2 +, no rubs, no gallops Respiratory: CTA bilaterally, no wheezing, no rhonchi Abdominal: Soft, NT, ND, bowel sounds + Extremities: no edema, no cyanosis    The results of significant diagnostics from this hospitalization (including imaging, microbiology, ancillary and laboratory) are listed below for reference.     Microbiology: Recent Results (from the past 240 hours)  Resp panel by RT-PCR (RSV, Flu A&B, Covid) Anterior Nasal Swab     Status: None   Collection Time: 09/09/23  4:22 PM   Specimen: Anterior Nasal Swab  Result Value Ref Range Status   SARS Coronavirus 2 by RT PCR NEGATIVE NEGATIVE Final    Comment: (NOTE) SARS-CoV-2 target nucleic acids are NOT DETECTED.  The SARS-CoV-2 RNA is generally detectable in upper respiratory specimens during the acute phase of infection. The lowest concentration of SARS-CoV-2 viral copies this assay can detect is 138 copies/mL. A negative result does not preclude SARS-Cov-2 infection and should not be used as the sole basis for treatment or other patient management decisions. A negative result may occur with  improper specimen collection/handling, submission of specimen other than nasopharyngeal swab, presence of viral mutation(s) within the areas targeted by this assay, and  inadequate number of viral copies(<138 copies/mL). A negative result must be combined with clinical observations, patient history, and epidemiological information. The expected result is Negative.  Fact Sheet for Patients:  BloggerCourse.com  Fact Sheet for Healthcare Providers:  SeriousBroker.it  This test is no t yet approved or cleared by the [place] FDA and  has been authorized for detection and/or diagnosis of SARS-CoV-2 by FDA under an Emergency Use Authorization (EUA). This EUA will remain  in effect (meaning this test can be used) for the duration of the COVID-19 declaration under Section 564(b)(1) of the Act, 21 U.S.C.section 360bbb-3(b)(1), unless the authorization is terminated  or revoked sooner.       Influenza A by PCR NEGATIVE NEGATIVE Final   Influenza B by PCR NEGATIVE NEGATIVE Final    Comment: (NOTE) The Xpert Xpress SARS-CoV-2/FLU/RSV plus assay is intended as an aid in the diagnosis of influenza from Nasopharyngeal swab specimens and should not be  used as a sole basis for treatment. Nasal washings and aspirates are unacceptable for Xpert Xpress SARS-CoV-2/FLU/RSV testing.  Fact Sheet for Patients: BloggerCourse.com  Fact Sheet for Healthcare Providers: SeriousBroker.it  This test is not yet approved or cleared by the [place] FDA and has been authorized for detection and/or diagnosis of SARS-CoV-2 by FDA under an Emergency Use Authorization (EUA). This EUA will remain in effect (meaning this test can be used) for the duration of the COVID-19 declaration under Section 564(b)(1) of the Act, 21 U.S.C. section 360bbb-3(b)(1), unless the authorization is terminated or revoked.     Resp Syncytial Virus by PCR NEGATIVE NEGATIVE Final    Comment: (NOTE) Fact Sheet for Patients: BloggerCourse.com  Fact Sheet for Healthcare  Providers: SeriousBroker.it  This test is not yet approved or cleared by the [place] FDA and has been authorized for detection and/or diagnosis of SARS-CoV-2 by FDA under an Emergency Use Authorization (EUA). This EUA will remain in effect (meaning this test can be used) for the duration of the COVID-19 declaration under Section 564(b)(1) of the Act, 21 U.S.C. section 360bbb-3(b)(1), unless the authorization is terminated or revoked.  Performed at [facility], [address] [place], [place] [address]   Blood Culture (routine x 2)     Status: None (Preliminary result)   Collection Time: 09/09/23  4:42 PM   Specimen: BLOOD  Result Value Ref Range Status   Specimen Description   Final    BLOOD BLOOD LEFT FOREARM Performed at [facility], [address] [place], [place] [address]    Special Requests   Final    BOTTLES DRAWN AEROBIC AND ANAEROBIC Blood Culture adequate volume Performed at [facility], [address], [place], [place] [address]    Culture   Final    NO GROWTH 2 DAYS Performed at [facility], [address] [place], [place] [address]    Report Status PENDING  Incomplete  Blood Culture (routine x 2)     Status: None (Preliminary result)   Collection Time: 09/09/23  4:54 PM   Specimen: BLOOD RIGHT FOREARM  Result Value Ref Range Status   Specimen Description   Final    BLOOD RIGHT FOREARM Performed at [facility], [address], [place], [place] [address]    Special Requests   Final    BOTTLES DRAWN AEROBIC AND ANAEROBIC Blood Culture adequate volume Performed at [facility], [address] [place], [place] [address]    Culture   Final    NO GROWTH 2 DAYS Performed at [facility], [address] [place], [place] [address]    Report Status PENDING  Incomplete     Labs: BNP (last 3 results) Recent Labs     05/18/23 1625  BNP 125.8*   Basic Metabolic Panel: Recent Labs  Lab 09/09/23 1642 09/10/23 0852 09/11/23 0538  NA 127* 126* 130*  K 4.3 3.5 3.1*  CL 89* 92* 97*  CO2 22 19* 25  GLUCOSE 295* 324* 138*  BUN 10 17 15   CREATININE 0.89 0.81 0.62  CALCIUM 9.9 8.8* 8.7*   Liver Function Tests: Recent Labs  Lab 09/09/23 1642  AST 25  ALT 18  ALKPHOS 90  BILITOT 0.8  PROT 6.5  ALBUMIN 2.9*   No results for input(s): "LIPASE", "AMYLASE" in the last 168 hours. No results for input(s): "AMMONIA" in the last 168 hours. CBC: Recent Labs  Lab 09/09/23  1642 09/10/23 0852 09/11/23 0538  WBC 11.3* 12.6* 11.7*  NEUTROABS 9.6*  --   --   HGB 11.1* 8.8* 8.3*  HCT 33.9* 27.4* 25.2*  MCV 95.2 100.0 96.2  PLT 373 304 280   Cardiac Enzymes: No results for input(s): "CKTOTAL", "CKMB", "CKMBINDEX", "TROPONINI" in the last 168 hours. BNP: Invalid input(s): "POCBNP" CBG: Recent Labs  Lab 09/10/23 1326 09/10/23 1649 09/10/23 2001 09/10/23 2355 09/11/23 0756  GLUCAP 200* 436* 231* 196* 124*   D-Dimer No results for input(s): "DDIMER" in the last 72 hours. Hgb A1c No results for input(s): "HGBA1C" in the last 72 hours. Lipid Profile No results for input(s): "CHOL", "HDL", "LDLCALC", "TRIG", "CHOLHDL", "LDLDIRECT" in the last 72 hours. Thyroid function studies Recent Labs    09/10/23 0322  TSH 0.188*   Anemia work up No results for input(s): "VITAMINB12", "FOLATE", "FERRITIN", "TIBC", "IRON", "RETICCTPCT" in the last 72 hours. Urinalysis    Component Value Date/Time   COLORURINE YELLOW 09/09/2023 1622   APPEARANCEUR CLEAR 09/09/2023 1622   LABSPEC 1.013 09/09/2023 1622   PHURINE 6.0 09/09/2023 1622   GLUCOSEU 50 (A) 09/09/2023 1622   HGBUR NEGATIVE 09/09/2023 1622   BILIRUBINUR NEGATIVE 09/09/2023 1622   BILIRUBINUR neg 05/06/2015 1421   KETONESUR 20 (A) 09/09/2023 1622   PROTEINUR 30 (A) 09/09/2023 1622   UROBILINOGEN 1.0 05/06/2015 1421   UROBILINOGEN 0.2  12/25/2014 1206   NITRITE NEGATIVE 09/09/2023 1622   LEUKOCYTESUR NEGATIVE 09/09/2023 1622   Sepsis Labs Recent Labs  Lab 09/09/23 1642 09/10/23 0852 09/11/23 0538  WBC 11.3* 12.6* 11.7*   Microbiology Recent Results (from the past 240 hours)  Resp panel by RT-PCR (RSV, Flu A&B, Covid) Anterior Nasal Swab     Status: None   Collection Time: 09/09/23  4:22 PM   Specimen: Anterior Nasal Swab  Result Value Ref Range Status   SARS Coronavirus 2 by RT PCR NEGATIVE NEGATIVE Final    Comment: (NOTE) SARS-CoV-2 target nucleic acids are NOT DETECTED.  The SARS-CoV-2 RNA is generally detectable in upper respiratory specimens during the acute phase of infection. The lowest concentration of SARS-CoV-2 viral copies this assay can detect is 138 copies/mL. A negative result does not preclude SARS-Cov-2 infection and should not be used as the sole basis for treatment or other patient management decisions. A negative result may occur with  improper specimen collection/handling, submission of specimen other than nasopharyngeal swab, presence of viral mutation(s) within the areas targeted by this assay, and inadequate number of viral copies(<138 copies/mL). A negative result must be combined with clinical observations, patient history, and epidemiological information. The expected result is Negative.  Fact Sheet for Patients:  BloggerCourse.com  Fact Sheet for Healthcare Providers:  SeriousBroker.it  This test is no t yet approved or cleared by the [place] FDA and  has been authorized for detection and/or diagnosis of SARS-CoV-2 by FDA under an Emergency Use Authorization (EUA). This EUA will remain  in effect (meaning this test can be used) for the duration of the COVID-19 declaration under Section 564(b)(1) of the Act, 21 U.S.C.section 360bbb-3(b)(1), unless the authorization is terminated  or revoked sooner.       Influenza A  by PCR NEGATIVE NEGATIVE Final   Influenza B by PCR NEGATIVE NEGATIVE Final    Comment: (NOTE) The Xpert Xpress SARS-CoV-2/FLU/RSV plus assay is intended as an aid in the diagnosis of influenza from Nasopharyngeal swab specimens and should not be used as a sole basis for treatment. Nasal washings and  aspirates are unacceptable for Xpert Xpress SARS-CoV-2/FLU/RSV testing.  Fact Sheet for Patients: BloggerCourse.com  Fact Sheet for Healthcare Providers: SeriousBroker.it  This test is not yet approved or cleared by the [place] FDA and has been authorized for detection and/or diagnosis of SARS-CoV-2 by FDA under an Emergency Use Authorization (EUA). This EUA will remain in effect (meaning this test can be used) for the duration of the COVID-19 declaration under Section 564(b)(1) of the Act, 21 U.S.C. section 360bbb-3(b)(1), unless the authorization is terminated or revoked.     Resp Syncytial Virus by PCR NEGATIVE NEGATIVE Final    Comment: (NOTE) Fact Sheet for Patients: BloggerCourse.com  Fact Sheet for Healthcare Providers: SeriousBroker.it  This test is not yet approved or cleared by the [place] FDA and has been authorized for detection and/or diagnosis of SARS-CoV-2 by FDA under an Emergency Use Authorization (EUA). This EUA will remain in effect (meaning this test can be used) for the duration of the COVID-19 declaration under Section 564(b)(1) of the Act, 21 U.S.C. section 360bbb-3(b)(1), unless the authorization is terminated or revoked.  Performed at [facility], [address], [place], [place] [address]   Blood Culture (routine x 2)     Status: None (Preliminary result)   Collection Time: 09/09/23  4:42 PM   Specimen: BLOOD  Result Value Ref Range Status   Specimen Description   Final    BLOOD BLOOD LEFT FOREARM Performed at [facility], [address], [place], [place] [address]    Special Requests   Final    BOTTLES DRAWN AEROBIC AND ANAEROBIC Blood Culture adequate volume Performed at [facility], [address], [place], [place] [address]    Culture   Final    NO GROWTH 2 DAYS Performed at [facility], [address] [place], [place] [address]    Report Status PENDING  Incomplete  Blood Culture (routine x 2)     Status: None (Preliminary result)   Collection Time: 09/09/23  4:54 PM   Specimen: BLOOD RIGHT FOREARM  Result Value Ref Range Status   Specimen Description   Final    BLOOD RIGHT FOREARM Performed at [facility], [address], [place], [place] [address]    Special Requests   Final    BOTTLES DRAWN AEROBIC AND ANAEROBIC Blood Culture adequate volume Performed at [facility], [address], [place], [place] [address]    Culture   Final    NO GROWTH 2 DAYS Performed at [facility], [address] [place], [place] [address]    Report Status PENDING  Incomplete    Please note: You were cared for by a hospitalist during your hospital stay. Once you are discharged, your primary care physician will handle any further medical issues. Please note that NO REFILLS for any discharge medications will be authorized once you are discharged, as it is imperative that you return to your primary care physician (or establish a relationship with a primary care physician if you do not have one) for your post hospital discharge needs so that they can reassess your need for medications and monitor your lab values.    Time coordinating discharge: 40 minutes  SIGNED:   Burnadette Pop, MD  [facility] 09/11/2023, 9:55 AM Pager [phone]  If 7PM-7AM, please contact [email or web address] [email or web address] Password [email or web address]

## 2023-09-14 LAB — CULTURE, BLOOD (ROUTINE X 2)
Culture: NO GROWTH
Culture: NO GROWTH
Special Requests: ADEQUATE
Special Requests: ADEQUATE

## 2023-10-11 ENCOUNTER — Ambulatory Visit: Payer: Medicare HMO

## 2023-10-26 DIAGNOSIS — Z794 Long term (current) use of insulin: Secondary | ICD-10-CM | POA: Diagnosis not present

## 2023-10-26 DIAGNOSIS — E1165 Type 2 diabetes mellitus with hyperglycemia: Secondary | ICD-10-CM | POA: Diagnosis not present

## 2023-10-26 DIAGNOSIS — E119 Type 2 diabetes mellitus without complications: Secondary | ICD-10-CM | POA: Diagnosis not present

## 2023-11-15 ENCOUNTER — Ambulatory Visit: Payer: Medicare HMO

## 2023-12-19 DEATH — deceased

## 2023-12-20 ENCOUNTER — Ambulatory Visit: Payer: Medicare HMO

## 2024-01-24 ENCOUNTER — Ambulatory Visit: Payer: Medicare HMO

## 2024-02-28 ENCOUNTER — Ambulatory Visit: Payer: Medicare HMO

## 2024-04-03 ENCOUNTER — Ambulatory Visit: Payer: Medicare HMO
